# Patient Record
Sex: Female | Born: 1945 | Race: White | Hispanic: No | Marital: Single | Smoking: Former smoker
Health system: Southern US, Community
[De-identification: ages and names within clinical notes are randomized; demographics above are authoritative.]

## PROBLEM LIST (undated history)

## (undated) ENCOUNTER — Emergency Department (HOSPITAL_COMMUNITY): Admission: EM | Payer: Medicare Other | Source: Home / Self Care

## (undated) DIAGNOSIS — I350 Nonrheumatic aortic (valve) stenosis: Secondary | ICD-10-CM

## (undated) DIAGNOSIS — I219 Acute myocardial infarction, unspecified: Secondary | ICD-10-CM

## (undated) DIAGNOSIS — I890 Lymphedema, not elsewhere classified: Secondary | ICD-10-CM

## (undated) DIAGNOSIS — I480 Paroxysmal atrial fibrillation: Secondary | ICD-10-CM

## (undated) DIAGNOSIS — I447 Left bundle-branch block, unspecified: Secondary | ICD-10-CM

## (undated) DIAGNOSIS — I2510 Atherosclerotic heart disease of native coronary artery without angina pectoris: Secondary | ICD-10-CM

## (undated) DIAGNOSIS — G8929 Other chronic pain: Secondary | ICD-10-CM

## (undated) DIAGNOSIS — J449 Chronic obstructive pulmonary disease, unspecified: Secondary | ICD-10-CM

## (undated) DIAGNOSIS — R918 Other nonspecific abnormal finding of lung field: Secondary | ICD-10-CM

## (undated) DIAGNOSIS — I471 Supraventricular tachycardia, unspecified: Secondary | ICD-10-CM

## (undated) DIAGNOSIS — I10 Essential (primary) hypertension: Secondary | ICD-10-CM

## (undated) DIAGNOSIS — E785 Hyperlipidemia, unspecified: Secondary | ICD-10-CM

## (undated) DIAGNOSIS — C50919 Malignant neoplasm of unspecified site of unspecified female breast: Secondary | ICD-10-CM

## (undated) DIAGNOSIS — I509 Heart failure, unspecified: Secondary | ICD-10-CM

## (undated) HISTORY — PX: VASCULAR SURGERY: SHX849

## (undated) HISTORY — PX: OTHER SURGICAL HISTORY: SHX169

## (undated) HISTORY — PX: CORONARY STENT PLACEMENT: SHX1402

---

## 2013-01-17 DIAGNOSIS — M722 Plantar fascial fibromatosis: Secondary | ICD-10-CM | POA: Insufficient documentation

## 2013-02-22 DIAGNOSIS — M76829 Posterior tibial tendinitis, unspecified leg: Secondary | ICD-10-CM | POA: Insufficient documentation

## 2013-09-14 DIAGNOSIS — M1710 Unilateral primary osteoarthritis, unspecified knee: Secondary | ICD-10-CM | POA: Insufficient documentation

## 2016-01-24 DIAGNOSIS — F329 Major depressive disorder, single episode, unspecified: Secondary | ICD-10-CM | POA: Insufficient documentation

## 2016-01-24 DIAGNOSIS — F419 Anxiety disorder, unspecified: Secondary | ICD-10-CM | POA: Insufficient documentation

## 2016-01-24 DIAGNOSIS — F32A Depression, unspecified: Secondary | ICD-10-CM | POA: Insufficient documentation

## 2016-02-12 DIAGNOSIS — H2513 Age-related nuclear cataract, bilateral: Secondary | ICD-10-CM | POA: Insufficient documentation

## 2016-07-29 DIAGNOSIS — D229 Melanocytic nevi, unspecified: Secondary | ICD-10-CM | POA: Insufficient documentation

## 2017-10-31 DIAGNOSIS — I350 Nonrheumatic aortic (valve) stenosis: Secondary | ICD-10-CM

## 2017-10-31 HISTORY — DX: Nonrheumatic aortic (valve) stenosis: I35.0

## 2017-11-23 ENCOUNTER — Inpatient Hospital Stay (HOSPITAL_COMMUNITY)
Admission: EM | Admit: 2017-11-23 | Discharge: 2017-11-27 | DRG: 246 | Disposition: A | Discharge status: Home / Self Care | Payer: Medicare Other | Attending: Cardiology | Admitting: Cardiology

## 2017-11-23 ENCOUNTER — Encounter (HOSPITAL_COMMUNITY): Payer: Self-pay | Admitting: *Deleted

## 2017-11-23 ENCOUNTER — Emergency Department (HOSPITAL_COMMUNITY): Payer: Medicare Other

## 2017-11-23 DIAGNOSIS — R0603 Acute respiratory distress: Secondary | ICD-10-CM | POA: Diagnosis not present

## 2017-11-23 DIAGNOSIS — I2583 Coronary atherosclerosis due to lipid rich plaque: Secondary | ICD-10-CM | POA: Diagnosis not present

## 2017-11-23 DIAGNOSIS — I472 Ventricular tachycardia: Secondary | ICD-10-CM | POA: Diagnosis present

## 2017-11-23 DIAGNOSIS — Z79811 Long term (current) use of aromatase inhibitors: Secondary | ICD-10-CM | POA: Diagnosis not present

## 2017-11-23 DIAGNOSIS — Z79899 Other long term (current) drug therapy: Secondary | ICD-10-CM | POA: Diagnosis not present

## 2017-11-23 DIAGNOSIS — I272 Pulmonary hypertension, unspecified: Secondary | ICD-10-CM | POA: Diagnosis present

## 2017-11-23 DIAGNOSIS — C7951 Secondary malignant neoplasm of bone: Secondary | ICD-10-CM | POA: Diagnosis present

## 2017-11-23 DIAGNOSIS — R739 Hyperglycemia, unspecified: Secondary | ICD-10-CM | POA: Diagnosis present

## 2017-11-23 DIAGNOSIS — E785 Hyperlipidemia, unspecified: Secondary | ICD-10-CM | POA: Diagnosis present

## 2017-11-23 DIAGNOSIS — I214 Non-ST elevation (NSTEMI) myocardial infarction: Secondary | ICD-10-CM | POA: Diagnosis not present

## 2017-11-23 DIAGNOSIS — Z955 Presence of coronary angioplasty implant and graft: Secondary | ICD-10-CM

## 2017-11-23 DIAGNOSIS — Z87891 Personal history of nicotine dependence: Secondary | ICD-10-CM

## 2017-11-23 DIAGNOSIS — Z6841 Body Mass Index (BMI) 40.0 and over, adult: Secondary | ICD-10-CM | POA: Diagnosis not present

## 2017-11-23 DIAGNOSIS — R000 Tachycardia, unspecified: Secondary | ICD-10-CM | POA: Diagnosis not present

## 2017-11-23 DIAGNOSIS — I25110 Atherosclerotic heart disease of native coronary artery with unstable angina pectoris: Secondary | ICD-10-CM | POA: Diagnosis not present

## 2017-11-23 DIAGNOSIS — I447 Left bundle-branch block, unspecified: Secondary | ICD-10-CM | POA: Diagnosis not present

## 2017-11-23 DIAGNOSIS — D72829 Elevated white blood cell count, unspecified: Secondary | ICD-10-CM | POA: Diagnosis present

## 2017-11-23 DIAGNOSIS — I10 Essential (primary) hypertension: Secondary | ICD-10-CM | POA: Diagnosis not present

## 2017-11-23 DIAGNOSIS — I110 Hypertensive heart disease with heart failure: Secondary | ICD-10-CM | POA: Diagnosis present

## 2017-11-23 DIAGNOSIS — Z7982 Long term (current) use of aspirin: Secondary | ICD-10-CM | POA: Diagnosis not present

## 2017-11-23 DIAGNOSIS — G8929 Other chronic pain: Secondary | ICD-10-CM | POA: Diagnosis present

## 2017-11-23 DIAGNOSIS — I471 Supraventricular tachycardia: Secondary | ICD-10-CM | POA: Diagnosis present

## 2017-11-23 DIAGNOSIS — J9601 Acute respiratory failure with hypoxia: Secondary | ICD-10-CM | POA: Diagnosis present

## 2017-11-23 DIAGNOSIS — I5033 Acute on chronic diastolic (congestive) heart failure: Secondary | ICD-10-CM | POA: Diagnosis present

## 2017-11-23 DIAGNOSIS — I350 Nonrheumatic aortic (valve) stenosis: Secondary | ICD-10-CM | POA: Diagnosis present

## 2017-11-23 DIAGNOSIS — C50411 Malignant neoplasm of upper-outer quadrant of right female breast: Secondary | ICD-10-CM

## 2017-11-23 DIAGNOSIS — C50919 Malignant neoplasm of unspecified site of unspecified female breast: Secondary | ICD-10-CM | POA: Diagnosis not present

## 2017-11-23 DIAGNOSIS — I252 Old myocardial infarction: Secondary | ICD-10-CM | POA: Diagnosis not present

## 2017-11-23 DIAGNOSIS — I2510 Atherosclerotic heart disease of native coronary artery without angina pectoris: Secondary | ICD-10-CM | POA: Diagnosis not present

## 2017-11-23 DIAGNOSIS — I509 Heart failure, unspecified: Secondary | ICD-10-CM

## 2017-11-23 HISTORY — DX: Left bundle-branch block, unspecified: I44.7

## 2017-11-23 HISTORY — DX: Other chronic pain: G89.29

## 2017-11-23 HISTORY — DX: Nonrheumatic aortic (valve) stenosis: I35.0

## 2017-11-23 HISTORY — DX: Malignant neoplasm of unspecified site of unspecified female breast: C50.919

## 2017-11-23 HISTORY — DX: Lymphedema, not elsewhere classified: I89.0

## 2017-11-23 HISTORY — DX: Essential (primary) hypertension: I10

## 2017-11-23 HISTORY — DX: Atherosclerotic heart disease of native coronary artery without angina pectoris: I25.10

## 2017-11-23 HISTORY — DX: Other nonspecific abnormal finding of lung field: R91.8

## 2017-11-23 HISTORY — DX: Hyperlipidemia, unspecified: E78.5

## 2017-11-23 HISTORY — DX: Supraventricular tachycardia, unspecified: I47.10

## 2017-11-23 HISTORY — DX: Morbid (severe) obesity due to excess calories: E66.01

## 2017-11-23 HISTORY — DX: Heart failure, unspecified: I50.9

## 2017-11-23 HISTORY — DX: Supraventricular tachycardia: I47.1

## 2017-11-23 HISTORY — DX: Acute myocardial infarction, unspecified: I21.9

## 2017-11-23 LAB — CBC WITH DIFFERENTIAL/PLATELET
BASOS PCT: 0 %
Basophils Absolute: 0.1 10*3/uL (ref 0.0–0.1)
Eosinophils Absolute: 0.3 10*3/uL (ref 0.0–0.7)
Eosinophils Relative: 2 %
HCT: 47.2 % — ABNORMAL HIGH (ref 36.0–46.0)
HEMOGLOBIN: 14.4 g/dL (ref 12.0–15.0)
LYMPHS ABS: 7.2 10*3/uL — AB (ref 0.7–4.0)
Lymphocytes Relative: 48 %
MCH: 30.6 pg (ref 26.0–34.0)
MCHC: 30.5 g/dL (ref 30.0–36.0)
MCV: 100.4 fL — AB (ref 78.0–100.0)
MONOS PCT: 6 %
Monocytes Absolute: 0.9 10*3/uL (ref 0.1–1.0)
NEUTROS ABS: 6.7 10*3/uL (ref 1.7–7.7)
NEUTROS PCT: 44 %
Platelets: 248 10*3/uL (ref 150–400)
RBC: 4.7 MIL/uL (ref 3.87–5.11)
RDW: 13.9 % (ref 11.5–15.5)
WBC: 15 10*3/uL — AB (ref 4.0–10.5)

## 2017-11-23 LAB — BRAIN NATRIURETIC PEPTIDE: B Natriuretic Peptide: 589 pg/mL — ABNORMAL HIGH (ref 0.0–100.0)

## 2017-11-23 LAB — COMPREHENSIVE METABOLIC PANEL
ALK PHOS: 80 U/L (ref 38–126)
ALT: 35 U/L (ref 14–54)
AST: 48 U/L — AB (ref 15–41)
Albumin: 3.7 g/dL (ref 3.5–5.0)
Anion gap: 18 — ABNORMAL HIGH (ref 5–15)
BUN: 20 mg/dL (ref 6–20)
CALCIUM: 9.2 mg/dL (ref 8.9–10.3)
CO2: 22 mmol/L (ref 22–32)
CREATININE: 0.93 mg/dL (ref 0.44–1.00)
Chloride: 104 mmol/L (ref 101–111)
GFR calc non Af Amer: >60 mL/min (ref >60)
Glucose, Bld: 294 mg/dL — ABNORMAL HIGH (ref 65–99)
Potassium: 3.8 mmol/L (ref 3.5–5.1)
SODIUM: 144 mmol/L (ref 135–145)
Total Bilirubin: 0.9 mg/dL (ref 0.3–1.2)
Total Protein: 7.5 g/dL (ref 6.5–8.1)

## 2017-11-23 LAB — TROPONIN I: Troponin I: <0.03 ng/mL (ref <0.03)

## 2017-11-23 LAB — PROTIME-INR
INR: 0.95
Prothrombin Time: 12.6 seconds (ref 11.4–15.2)

## 2017-11-23 LAB — I-STAT CHEM 8, ED
BUN: 24 mg/dL — AB (ref 6–20)
CREATININE: 0.8 mg/dL (ref 0.44–1.00)
Calcium, Ion: 1.14 mmol/L — ABNORMAL LOW (ref 1.15–1.40)
Chloride: 103 mmol/L (ref 101–111)
Glucose, Bld: 288 mg/dL — ABNORMAL HIGH (ref 65–99)
HEMATOCRIT: 47 % — AB (ref 36.0–46.0)
Hemoglobin: 16 g/dL — ABNORMAL HIGH (ref 12.0–15.0)
POTASSIUM: 3.9 mmol/L (ref 3.5–5.1)
Sodium: 142 mmol/L (ref 135–145)
TCO2: 25 mmol/L (ref 22–32)

## 2017-11-23 LAB — CBG MONITORING, ED: Glucose-Capillary: 103 mg/dL — ABNORMAL HIGH (ref 65–99)

## 2017-11-23 MED ORDER — SODIUM CHLORIDE 0.9% FLUSH: 3.0000 mL | INTRAVENOUS | Status: DC | PRN

## 2017-11-23 MED ORDER — OXYCODONE HCL 5 MG PO TABS: 10.0000 mg | ORAL_TABLET | Freq: Four times a day (QID) | ORAL | Status: DC | PRN

## 2017-11-23 MED ORDER — SODIUM CHLORIDE 0.9 % IV SOLN: INTRAVENOUS | Status: DC

## 2017-11-23 MED ORDER — SODIUM CHLORIDE 0.9% FLUSH
3.0000 mL | Freq: Two times a day (BID) | INTRAVENOUS | Status: DC
  Administered 2017-11-24 – 2017-11-26 (×5): 3 mL via INTRAVENOUS

## 2017-11-23 MED ORDER — ADULT MULTIVITAMIN W/MINERALS CH
1.0000 | ORAL_TABLET | Freq: Every day | ORAL | Status: DC
  Administered 2017-11-24 – 2017-11-27 (×4): 1 via ORAL
  Filled 2017-11-23 (×4): qty 1

## 2017-11-23 MED ORDER — POTASSIUM CHLORIDE CRYS ER 20 MEQ PO TBCR
20.0000 meq | EXTENDED_RELEASE_TABLET | Freq: Every day | ORAL | Status: DC
  Administered 2017-11-24 – 2017-11-27 (×3): 20 meq via ORAL
  Filled 2017-11-23 (×4): qty 1

## 2017-11-23 MED ORDER — CALCIUM-PHOSPHORUS-VITAMIN D 250-107-500 MG-MG-UNIT PO CHEW: 2.0000 | CHEWABLE_TABLET | Freq: Two times a day (BID) | ORAL | Status: DC

## 2017-11-23 MED ORDER — FUROSEMIDE 10 MG/ML IJ SOLN
80.0000 mg | Freq: Once | INTRAMUSCULAR | Status: AC
  Administered 2017-11-23: 80 mg via INTRAVENOUS

## 2017-11-23 MED ORDER — ACETAMINOPHEN 325 MG PO TABS
650.0000 mg | ORAL_TABLET | ORAL | Status: DC | PRN
  Administered 2017-11-24: 650 mg via ORAL
  Filled 2017-11-23: qty 2

## 2017-11-23 MED ORDER — LETROZOLE 2.5 MG PO TABS
2.5000 mg | ORAL_TABLET | Freq: Every day | ORAL | Status: DC
  Administered 2017-11-24 – 2017-11-27 (×4): 2.5 mg via ORAL
  Filled 2017-11-23 (×5): qty 1

## 2017-11-23 MED ORDER — INSULIN ASPART 100 UNIT/ML ~~LOC~~ SOLN: 0.0000 [IU] | Freq: Every day | SUBCUTANEOUS | Status: DC

## 2017-11-23 MED ORDER — HYDRALAZINE HCL 20 MG/ML IJ SOLN: 10.0000 mg | INTRAMUSCULAR | Status: DC | PRN

## 2017-11-23 MED ORDER — ATENOLOL 50 MG PO TABS
25.0000 mg | ORAL_TABLET | Freq: Every day | ORAL | Status: DC
  Administered 2017-11-24 – 2017-11-27 (×4): 25 mg via ORAL
  Filled 2017-11-23 (×4): qty 1

## 2017-11-23 MED ORDER — ASPIRIN EC 325 MG PO TBEC
325.0000 mg | DELAYED_RELEASE_TABLET | Freq: Every day | ORAL | Status: DC
  Administered 2017-11-24 – 2017-11-26 (×3): 325 mg via ORAL
  Filled 2017-11-23 (×3): qty 1

## 2017-11-23 MED ORDER — SODIUM CHLORIDE 0.9 % IV SOLN: 250.0000 mL | INTRAVENOUS | Status: DC | PRN

## 2017-11-23 MED ORDER — ATORVASTATIN CALCIUM 40 MG PO TABS
80.0000 mg | ORAL_TABLET | Freq: Every day | ORAL | Status: DC
  Administered 2017-11-24 – 2017-11-26 (×3): 80 mg via ORAL
  Filled 2017-11-23: qty 1
  Filled 2017-11-23 (×3): qty 2

## 2017-11-23 MED ORDER — FUROSEMIDE 10 MG/ML IJ SOLN
40.0000 mg | Freq: Two times a day (BID) | INTRAMUSCULAR | Status: DC
  Administered 2017-11-24 – 2017-11-25 (×3): 40 mg via INTRAVENOUS
  Filled 2017-11-23 (×3): qty 4

## 2017-11-23 MED ORDER — INSULIN ASPART 100 UNIT/ML ~~LOC~~ SOLN: 0.0000 [IU] | Freq: Three times a day (TID) | SUBCUTANEOUS | Status: DC

## 2017-11-23 MED ORDER — ALPRAZOLAM 0.25 MG PO TABS
0.2500 mg | ORAL_TABLET | Freq: Two times a day (BID) | ORAL | Status: DC | PRN
  Administered 2017-11-24: 0.25 mg via ORAL
  Filled 2017-11-23 (×2): qty 1

## 2017-11-23 MED ORDER — HYDROCHLOROTHIAZIDE 25 MG PO TABS
25.0000 mg | ORAL_TABLET | Freq: Every day | ORAL | Status: DC
Start: 2017-11-24 — End: 2017-11-27
  Administered 2017-11-24 – 2017-11-27 (×4): 25 mg via ORAL
  Filled 2017-11-23 (×4): qty 1

## 2017-11-23 MED ORDER — ONDANSETRON HCL 4 MG/2ML IJ SOLN: 4.0000 mg | Freq: Four times a day (QID) | INTRAMUSCULAR | Status: DC | PRN

## 2017-11-23 MED ORDER — NITROGLYCERIN IN D5W 200-5 MCG/ML-% IV SOLN
5.0000 ug/min | Freq: Once | INTRAVENOUS | Status: AC
  Administered 2017-11-23: 5 ug/min via INTRAVENOUS

## 2017-11-23 MED ORDER — LOSARTAN POTASSIUM 50 MG PO TABS
50.0000 mg | ORAL_TABLET | Freq: Every day | ORAL | Status: DC
Start: 2017-11-24 — End: 2017-11-27
  Administered 2017-11-24 – 2017-11-27 (×4): 50 mg via ORAL
  Filled 2017-11-23 (×4): qty 1

## 2017-11-23 MED ORDER — ENOXAPARIN SODIUM 40 MG/0.4ML ~~LOC~~ SOLN
40.0000 mg | SUBCUTANEOUS | Status: DC
  Administered 2017-11-23: 40 mg via SUBCUTANEOUS
  Filled 2017-11-23: qty 0.4

## 2017-11-23 NOTE — ED Provider Notes (Signed)
[facility] EMERGENCY DEPARTMENT Provider Note   CSN: [id number] Arrival date & time: 11/23/17  1850     History   Chief Complaint Chief Complaint  Patient presents with  . Respiratory Distress    HPI Stacey [name] is a 71 y.o. female.  Patient brought in by EMS.  EMS was called on a wellness call for a woman that was sitting in her car outside of a store.  EMS immediately recognize she was having respiratory distress.  And noted that she was very tachycardic.  Having difficulty breathing.  At one point they thought she went into V. tach with a pulse they cardioverted her.  Felt that she went back to a sinus tach.  Patient arrived on 100% nonrebreather and respiratory distress with heart rate around 150.  That looked like perhaps a sinus tachycardia with a left bundle branch block.  Patient had good pulse.  Patient's blood pressure was elevated like 186/127 upon arrival.  Respiratory rate was around 40.  Eventually patient's son arrived.  Patient is from the [place] area.  She is here for Christmas.  Patient was fine earlier in the day.  Patient has she started to improve denied any chest pain or any feeling sick earlier in the day other than some mild shortness of breath.      Past Medical History:  Diagnosis Date  . CHF (congestive heart failure) ([place])   . Hypertension   . Left bundle branch block   . MI (myocardial infarction) ([place])     There are no active problems to display for this patient.   Past Surgical History:  Procedure Laterality Date  . CORONARY STENT PLACEMENT      OB History    No data available       Home Medications    Prior to Admission medications   Not on File    Family History History reviewed. No pertinent family history.  Social History Social History   Tobacco Use  . Smoking status: Former Research scientist (life sciences)  . Smokeless tobacco: Never Used  Substance Use Topics  . Alcohol use: No    Frequency: Never  . Drug use: No     Allergies     Patient has no known allergies.   Review of Systems Review of Systems  Unable to perform ROS: Severe respiratory distress     Physical Exam Updated Vital Signs BP 103/70   Pulse 88   Resp (!) 26   Ht 1.499 m (4\' 11" )   Wt 121.1 kg (267 lb)   SpO2 98%   BMI 53.93 kg/m   Physical Exam  Constitutional: She appears well-developed and well-nourished. She appears distressed.  HENT:  Head: Normocephalic and atraumatic.  Eyes: Conjunctivae and EOM are normal. Pupils are equal, round, and reactive to light.  Neck: Neck supple.  Cardiovascular:  Tachycardic  Pulmonary/Chest: She is in respiratory distress. She has rales.  Abdominal: Soft. Bowel sounds are normal. There is no tenderness.  Musculoskeletal: She exhibits edema.  Neurological: She is alert.  Alert but somnolent.  Would follow commands.  Would move all 4 extremities.  Would communicate some.  Skin: Skin is warm.  Nursing note and vitals reviewed.    ED Treatments / Results  Labs (all labs ordered are listed, but only abnormal results are displayed) Labs Reviewed  CBC WITH DIFFERENTIAL/PLATELET - Abnormal; Notable for the following components:      Result Value   WBC 15.0 (*)    HCT 47.2 (*)  MCV 100.4 (*)    Lymphs Abs 7.2 (*)    All other components within normal limits  COMPREHENSIVE METABOLIC PANEL - Abnormal; Notable for the following components:   Glucose, Bld 294 (*)    AST 48 (*)    Anion gap 18 (*)    All other components within normal limits  BRAIN NATRIURETIC PEPTIDE - Abnormal; Notable for the following components:   B Natriuretic Peptide 589.0 (*)    All other components within normal limits  I-STAT CHEM 8, ED - Abnormal; Notable for the following components:   BUN 24 (*)    Glucose, Bld 288 (*)    Calcium, Ion 1.14 (*)    Hemoglobin 16.0 (*)    HCT 47.0 (*)    All other components within normal limits  TROPONIN I  PROTIME-INR  URINALYSIS, ROUTINE W REFLEX MICROSCOPIC    EKG  EKG  Interpretation  Date/Time:  Monday November 23 2017 18:55:03 EST Ventricular Rate:  128 PR Interval:    QRS Duration: 147 QT Interval:  338 QTC Calculation: 494 R Axis:   -54 Text Interpretation:  Sinus tachycardia Left bundle branch block Baseline wander in lead(s) V4 No previous ECGs available Confirmed by Fredia Sorrow [phone]) on 11/23/2017 7:15:13 PM       Radiology Dg Chest Portable 1 View  Result Date: 11/23/2017 CLINICAL DATA:  Respiratory distress. EXAM: PORTABLE CHEST 1 VIEW COMPARISON:  None. FINDINGS: Lungs are adequately inflated with hazy central bilateral airspace opacification which may be due to interstitial edema versus infection. No definite effusion. Cardiomediastinal silhouette is within normal. There is mild degenerate change of the spine. IMPRESSION: Hazy bilateral central airspace opacification which may be due to interstitial edema versus infection. Electronically Signed   By: Marin Olp M.D.   On: 11/23/2017 19:09    Procedures Procedures (including critical care time)  CRITICAL CARE Performed by: Fredia Sorrow Total critical care time: 30 minutes Critical care time was exclusive of separately billable procedures and treating other patients. Critical care was necessary to treat or prevent imminent or life-threatening deterioration. Critical care was time spent personally by me on the following activities: development of treatment plan with patient and/or surrogate as well as nursing, discussions with consultants, evaluation of patient's response to treatment, examination of patient, obtaining history from patient or surrogate, ordering and performing treatments and interventions, ordering and review of laboratory studies, ordering and review of radiographic studies, pulse oximetry and re-evaluation of patient's condition.   Medications Ordered in ED Medications  0.9 %  sodium chloride infusion ( Intravenous New Bag/Given 11/23/17 1918)  furosemide  (LASIX) injection 80 mg (80 mg Intravenous Given 11/23/17 1900)  nitroGLYCERIN 50 mg in dextrose 5 % 250 mL (0.2 mg/mL) infusion (5 mcg/min Intravenous New Bag/Given 11/23/17 1912)     Initial Impression / Assessment and Plan / ED Course  I have reviewed the triage vital signs and the nursing notes.  Pertinent labs & imaging results that were available during my care of the patient were reviewed by me and considered in my medical decision making (see chart for details).    Patient brought in by EMS.  Obvious respiratory distress.  On 100% nonrebreather marketed tachycardia market hypertension.  Patient from [place] area.  Patient states that she has a history of congestive heart failure.  Has cardiac stents.  Patient also has hypertension.  Denied diabetes.  Patient also stated that she has a history of breast cancer in the past.  Patient started immediately  on BiPAP.  Started on nitroglycerin drip for the elevated blood pressure and given 80 mg of Lasix patient on the BiPAP had significant improvement in her respiratory status and also brought her heart rate down to around 120 from the 150 range.  Then after the Lasix and the nitroglycerin drip heart rate came down to around 100 or in the upper 90s.  Nitroglycerin drip brought her blood pressure diastolics from 403 down to around 120.  Overall patient was significant improvement.  Patient states that she did not have any chest pain.  No complaints earlier other than some shortness of breath. Patient is visiting family members here from the [place] area.  Most recent vital signs patient's heart rate was down to 87.  Blood pressure systolic 98 diastolic 61.  Patient overall feeling much better.  Final Clinical Impressions(s) / ED Diagnoses   Final diagnoses:  Acute on chronic congestive heart failure, unspecified heart failure type [facility])  Respiratory distress    ED Discharge Orders    None       Fredia Sorrow, MD 11/23/17 2012

## 2017-11-23 NOTE — H&P (Addendum)
History and Physical    [place] GYF:[id number] DOB: 02/19/1946 DOA: 11/23/2017  PCP: Bernette Redbird, MD   Patient coming from: Home  Chief Complaint: Respiratory distress  HPI: Stacey [name] is a 71 y.o. 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  Patient had reportedly been doing fairly well earlier in the day with no recent cough, chest pain, fevers, or chills.  She raised concern of bystanders when she was noted to be sitting in her car and apparent respiratory distress in a parking lot.  EMS came to check on her, found her to be in acute distress, put her on the monitor, suspect of the she may be in ventricular tachycardia, administered a shock, and transported her to the hospital.  Patient denies any chest pain or palpitations, reports of dyspnea, but no cough or fever.  Reports chronic bilateral lower extremity swelling.  Reports history of CHF, and acknowledges some dietary indiscretions over the past day or so as she has been visiting local family for the holidays from her usual residence near [place].  ED Course: Upon arrival to the ED, patient is found to be in acute respiratory distress, tachypneic with respiratory rate close to 40, tachycardic in the 120s, and hypertensive to 190/120.  EKG features a sinus tachycardia with rate 128 and chronic left bundle branch block.  Chest x-ray is notable for hazy bilateral airspace opacification suspected secondary to interstitial edema.  Chemistry panel is notable for serum glucose of 294 and CBC features a leukocytosis to 15,000.  BNP is elevated to 589, troponin is undetectable, and INR is normal.  Patient was started on nitroglycerin infusion, BiPAP, given 80 mg IV Lasix.  Blood pressure normalized,  work of breathing improved, respiratory rate is down to the 20s, pressure has fallen to the roots of acceptable range.  Nitroglycerin was stopped due to low blood pressure and overall pressure remained stable off the nitroglycerin drip, she continues to require BiPAP but is much improved, and will be admitted to the stepdown unit for ongoing evaluation and of acute hypoxic respiratory failure suspected secondary to acute on chronic diastolic CHF.  Review of Systems:  All other systems reviewed and apart from HPI, are negative.  Past Medical History:  Diagnosis Date  . CHF (congestive heart failure) ([place])   . Hypertension   . Left bundle branch block   . MI (myocardial infarction) [facility])     Past Surgical History:  Procedure Laterality Date  . CORONARY STENT PLACEMENT       reports that she has quit smoking. she has never used smokeless tobacco. She reports that she does not drink alcohol or use drugs.  No Known Allergies  Family History  Problem Relation Age of Onset  . Sudden Cardiac Death Neg Hx      Prior to Admission medications   Medication Sig Start Date End Date Taking? Authorizing Provider  aspirin EC 325 MG tablet Take 325 mg by mouth daily.   Yes [provider]  atenolol (TENORMIN) 25 MG tablet Take 25 mg by mouth daily.   Yes [provider]  atorvastatin (LIPITOR) 80 MG tablet Take 80 mg by mouth daily.   Yes [provider]  Calcium-Phosphorus-Vitamin D (CITRACAL +D3) 250-107-500 MG-MG-UNIT CHEW Chew 2 tablets by mouth 2 (two) times daily.   Yes  [provider]  hydrochlorothiazide (HYDRODIURIL) 25 MG tablet Take 25 mg by mouth daily.   Yes [provider]  letrozole (FEMARA) 2.5 MG tablet Take 2.5 mg by mouth daily.   Yes [provider]  losartan (COZAAR) 50 MG tablet Take 50 mg by mouth daily.   Yes [provider]  Multiple Vitamin (MULTIVITAMIN WITH MINERALS) TABS tablet Take 1 tablet by mouth daily.    Yes [provider]  Oxycodone HCl 10 MG TABS Take 10 mg by mouth every 6 (six) hours as needed (for pain).   Yes [provider]  Potassium 99 MG TABS Take 1 tablet by mouth daily.   Yes [provider]    Physical Exam: Vitals:   11/23/17 2015 11/23/17 2026 11/23/17 2100 11/23/17 2115  BP: (!) 74/53 104/71 138/77 123/78  Pulse: 87 74 76   Resp: (!) _0 SpO2: 100% 100% 100%   Weight:      Height:          Constitutional: Acute respiratory distress with tachypnea and accessory muscle use. No pallor, no diaphoresis.  Eyes: PERTLA, lids and conjunctivae normal ENMT: Mucous membranes are moist. Posterior pharynx clear of any exudate or lesions.   Neck: normal, supple, no masses, no thyromegaly Respiratory: Diffuse rales. Increased WOB. No pallor. No cyanosis.   Cardiovascular: Rate ~120 and regular. 2+ pretibial edema bilaterally. Abdomen: No distension, no tenderness, no masses palpated. Bowel sounds normal.  Musculoskeletal: no clubbing / cyanosis. No joint deformity upper and lower extremities.  Skin: no significant rashes, lesions, ulcers. Warm, dry, well-perfused. Neurologic: CN 2-12 grossly intact. Sensation intact. Strength 5/5 in all 4 limbs.  Psychiatric:  Alert and oriented x 3. Pleasant, cooperative.     Labs on Admission: I have personally reviewed following labs and imaging studies  CBC: Recent Labs  Lab 11/23/17 1900 11/23/17 1901  WBC 15.0*  --   NEUTROABS 6.7  --   HGB 14.4 16.0*  HCT 47.2* 47.0*  MCV 100.4*  --   PLT 248  --    Basic Metabolic Panel: Recent Labs  Lab 11/23/17 1900 11/23/17 1901  NA 144 142  K 3.8 3.9  CL 104 103  CO2 22  --   GLUCOSE 294* 288*  BUN 20 24*  CREATININE 0.93 0.80  CALCIUM 9.2  --    GFR: Estimated Creatinine Clearance: 76.9 mL/min (by C-G formula based on SCr of 0.8 mg/dL). Liver Function Tests: Recent Labs  Lab 11/23/17 1900  AST 48*  ALT 35  ALKPHOS 80  BILITOT 0.9   PROT 7.5  ALBUMIN 3.7   No results for input(s): LIPASE, AMYLASE in the last 168 hours. No results for input(s): AMMONIA in the last 168 hours. Coagulation Profile: Recent Labs  Lab 11/23/17 1900  INR 0.95   Cardiac Enzymes: Recent Labs  Lab 11/23/17 1900  TROPONINI <0.03   BNP (last 3 results) No results for input(s): PROBNP in the last 8760 hours. HbA1C: No results for input(s): HGBA1C in the last 72 hours. CBG: No results for input(s): GLUCAP in the last 168 hours. Lipid Profile: No results for input(s): CHOL, HDL, LDLCALC, TRIG, CHOLHDL, LDLDIRECT in the last 72 hours. Thyroid Function Tests: No results for input(s): TSH, T4TOTAL, FREET4, T3FREE, THYROIDAB in the last 72 hours. Anemia Panel: No results for input(s): VITAMINB12, FOLATE, FERRITIN, TIBC, IRON, RETICCTPCT in the last 72 hours. Urine analysis: No results found for: COLORURINE, APPEARANCEUR, [place], [place], [place], [place], [place],  KETONESUR, PROTEINUR, UROBILINOGEN, NITRITE, LEUKOCYTESUR Sepsis Labs: _0 (procalcitonin:4,lacticidven:4) )No results found for this or any previous visit (from the past 240 hour(s)).   Radiological Exams on Admission: Dg Chest Portable 1 View  Result Date: 11/23/2017 CLINICAL DATA:  Respiratory distress. EXAM: PORTABLE CHEST 1 VIEW COMPARISON:  None. FINDINGS: Lungs are adequately inflated with hazy central bilateral airspace opacification which may be due to interstitial edema versus infection. No definite effusion. Cardiomediastinal silhouette is within normal. There is mild degenerate change of the spine. IMPRESSION: Hazy bilateral central airspace opacification which may be due to interstitial edema versus infection. Electronically Signed   By: Marin Olp M.D.   On: 11/23/2017 19:09    EKG: Independently reviewed. Sinus tachycardia (rate 128), chronic LBBB.   Assessment/Plan  1. Acute on chronic diastolic CHF; acute hypoxic respiratory failure  - Pt  presents with acute respiratory distress requiring BiPAP, found to have diffuse crackles and edema on CXR, elevated BNP  - Treated in ED with Lasix 80 mg IV and nitroglycerin infusion; she has made a dramatic improvement in ED with this  - TTE (12/15/16) with EF 50%, mild LVH, grade 1 diastolic dysfunction, mild pulm HTN, no significant valvular disease   - Continue cardiac monitoring, SLIV, fluid-restrict, follow daily wts and I/O's, continue diuresis with Lasix 40 mg IV q12h, continue beta-blocker and ARB, wean from BiPAP as tolerated, follow chem panel daily, update echocardiogram    2. Hypertension  - BP elevated to 190/120 range initially, normalized with IV Lasix and IV nitroglycerin  - Continue beta-blocker and ARB, use hydralazine IVP's prn    3. CAD; chronic LBBB  - No anginal complaints and troponin neg  - Reports hx of stents [facility] at [facility] and has a chronic LBBB - Suspect her presentation was secondary to dietary indiscretions, but ischemic etiology will be excluded with repeat troponin in a few hours as she never had chest pain with prior MI  - Continue cardiac monitoring, check another troponin, continue ASA, beta-blocker, and ARB   4. Breast cancer  - Right UOQ mass with thoracic spine met, following at [facility]  - Continue Letrozole   5. Hyperglycemia  - No hx of DM and no A1c on file  - Serum glucose 294 on admission  - Follow CBG's and use a SSI with Novolog as needed     DVT prophylaxis: Lovenox Code Status: Full  Family Communication: Family updated at bedside Disposition Plan: Admit to SDU Consults called: None Admission status: Inpatient    Vianne Bulls, MD [facility] Pager [phone]  If 7PM-7AM, please contact night-coverage [email or web address] Password [facility]  11/23/2017, 9:27 PM

## 2017-11-23 NOTE — ED Notes (Signed)
Nitro stopped due to soft B/p-98/61

## 2017-11-23 NOTE — ED Triage Notes (Signed)
Pt brought in emergently by [facility] for respiratory distress; pt was found sitting in her car by herself having difficulty breathing; when ems placed pt on monitor pt was found to be in vtach, pt was cardioverted x 1 by ems and converted to sinus tach; pt brought in on NRB

## 2017-11-24 ENCOUNTER — Other Ambulatory Visit: Payer: Self-pay

## 2017-11-24 ENCOUNTER — Inpatient Hospital Stay (HOSPITAL_COMMUNITY): Payer: Medicare Other

## 2017-11-24 DIAGNOSIS — I214 Non-ST elevation (NSTEMI) myocardial infarction: Secondary | ICD-10-CM

## 2017-11-24 DIAGNOSIS — I5033 Acute on chronic diastolic (congestive) heart failure: Secondary | ICD-10-CM

## 2017-11-24 LAB — URINALYSIS, ROUTINE W REFLEX MICROSCOPIC
Bilirubin Urine: NEGATIVE
Glucose, UA: NEGATIVE mg/dL
Hgb urine dipstick: NEGATIVE
Ketones, ur: NEGATIVE mg/dL
Leukocytes, UA: NEGATIVE
Nitrite: NEGATIVE
Protein, ur: NEGATIVE mg/dL
Specific Gravity, Urine: 1.012 (ref 1.005–1.030)
pH: 6 (ref 5.0–8.0)

## 2017-11-24 LAB — ECHOCARDIOGRAM COMPLETE
Height: 59 in
Weight: 4299.85 oz

## 2017-11-24 LAB — BASIC METABOLIC PANEL
Anion gap: 10 (ref 5–15)
BUN: 20 mg/dL (ref 6–20)
CHLORIDE: 102 mmol/L (ref 101–111)
CO2: 30 mmol/L (ref 22–32)
Calcium: 8.3 mg/dL — ABNORMAL LOW (ref 8.9–10.3)
Creatinine, Ser: 0.72 mg/dL (ref 0.44–1.00)
GFR calc non Af Amer: >60 mL/min (ref >60)
Glucose, Bld: 124 mg/dL — ABNORMAL HIGH (ref 65–99)
POTASSIUM: 3.7 mmol/L (ref 3.5–5.1)
SODIUM: 142 mmol/L (ref 135–145)

## 2017-11-24 LAB — APTT: APTT: 31 s (ref 24–36)

## 2017-11-24 LAB — TROPONIN I
TROPONIN I: 1.17 ng/mL — AB (ref <0.03)
Troponin I: 0.78 ng/mL (ref <0.03)
Troponin I: 0.9 ng/mL (ref <0.03)
Troponin I: 0.73 ng/mL (ref <0.03)

## 2017-11-24 LAB — HEPARIN LEVEL (UNFRACTIONATED): HEPARIN UNFRACTIONATED: 0.42 [IU]/mL (ref 0.30–0.70)

## 2017-11-24 LAB — GLUCOSE, CAPILLARY
GLUCOSE-CAPILLARY: 108 mg/dL — AB (ref 65–99)
GLUCOSE-CAPILLARY: 93 mg/dL (ref 65–99)
Glucose-Capillary: 106 mg/dL — ABNORMAL HIGH (ref 65–99)
Glucose-Capillary: 100 mg/dL — ABNORMAL HIGH (ref 65–99)

## 2017-11-24 LAB — MRSA PCR SCREENING: MRSA by PCR: NEGATIVE

## 2017-11-24 MED ORDER — ORAL CARE MOUTH RINSE
15.0000 mL | Freq: Two times a day (BID) | OROMUCOSAL | Status: DC
  Administered 2017-11-24 – 2017-11-26 (×3): 15 mL via OROMUCOSAL

## 2017-11-24 MED ORDER — CHLORHEXIDINE GLUCONATE 0.12 % MT SOLN
15.0000 mL | Freq: Two times a day (BID) | OROMUCOSAL | Status: DC
  Administered 2017-11-24 – 2017-11-27 (×7): 15 mL via OROMUCOSAL
  Filled 2017-11-24 (×7): qty 15

## 2017-11-24 MED ORDER — CALCIUM CARBONATE-VITAMIN D 500-200 MG-UNIT PO TABS
1.0000 | ORAL_TABLET | Freq: Two times a day (BID) | ORAL | Status: DC
  Administered 2017-11-24 – 2017-11-27 (×7): 1 via ORAL
  Filled 2017-11-24 (×12): qty 1

## 2017-11-24 MED ORDER — HEPARIN (PORCINE) IN NACL 100-0.45 UNIT/ML-% IJ SOLN
900.0000 [IU]/h | INTRAMUSCULAR | Status: DC
  Administered 2017-11-24 – 2017-11-26 (×3): 900 [IU]/h via INTRAVENOUS
  Filled 2017-11-24 (×4): qty 250

## 2017-11-24 NOTE — Progress Notes (Signed)
Dr. Florene Glen notified of Troponin 1.17.

## 2017-11-24 NOTE — Progress Notes (Signed)
CRITICAL VALUE ALERT  Critical Value:  Troponin 0.78  Date & Time Notied: 11/24/2017 at 0200  Provider Notified: Opyd MD  Orders Received/Actions taken: None received yet

## 2017-11-24 NOTE — Progress Notes (Signed)
Patient off BiPAP for now , on 2lpm nasal cannula

## 2017-11-24 NOTE — Progress Notes (Signed)
PROGRESS NOTE    [place]  WER:[id number] DOB: 1946-09-20 DOA: 11/23/2017 PCP: Bernette Redbird, MD   Brief Narrative:  71 y.o. 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  Patient had reportedly been doing fairly well earlier in the day with no recent cough, chest pain, fevers, or chills.  She raised concern of bystanders when she was noted to be sitting in her car and apparent respiratory distress in [place] parking lot.  EMS came to check on her, found her to be in acute distress, put her on the monitor, suspect of the she may be in ventricular tachycardia, administered Param Capri shock, and transported her to the hospital.  Patient denies any chest pain or palpitations, reports of dyspnea, but no cough or fever.  Reports chronic bilateral lower extremity swelling.  Reports history of CHF, and acknowledges some dietary indiscretions over the past day or so as she has been visiting local family for the holidays from her usual residence near [place]:   Principal Problem:   Acute on chronic diastolic CHF (congestive heart failure) ([place]) Active Problems:   Hypertension   Left bundle branch block   CAD (coronary artery disease)   Breast cancer ([place])   Acute respiratory failure with hypoxia ([place])   1. Acute on chronic diastolic CHF; acute hypoxic respiratory failure  - Pt presents with acute respiratory distress requiring BiPAP, found to have diffuse crackles and edema on CXR, elevated BNP  - Treated in ED with Lasix 80 mg IV and nitroglycerin infusion; she has made Tifanie Gardiner dramatic improvement in ED with this  - Lasix 40 IV BID ordered - now off bipap - pt on losartan and atenolol - TTE (12/15/16) with EF 50%, mild LVH, grade 1 diastolic dysfunction,  mild pulm HTN, no significant valvular disease   '[ ]'  repeat echo today with EF 19-50%, grade 1 diastolic dysfunction, mild AS, PA peak pressure 32 mmHg - Notes her weight is typically 263-265 (currently 268)  2.  Elevated troponin:  She denies CP throughout this episode, only shortness of breath.  Troponin rising from negative to 0.78 to 1.17.  On heparin gtt.  ASA given.  Discussed with cardiology (Dr. Radford Pax) who recommended follow up echo and if stable consult cardiology at [facility] tomorrow.  3. CAD; chronic LBBB  - No anginal complaints, but troponin rising as above - Reports hx of stents placed at [facility] and has Arbor Cohen chronic LBBB - Suspect her presentation was secondary to dietary indiscretions - Continue cardiac monitoring, continue ASA, beta-blocker, and ARB    4. Hypertension  - BP elevated to 190/120 range initially, improved with IV lasix and nitroglycerin - Continue beta-blocker and ARB, use hydralazine IVP's prn.   - Add or increase to regimen as needed.  5. Breast cancer  - Right UOQ mass with thoracic spine met, following at [facility]  - Continue Letrozole   6. Hyperglycemia  - No hx of DM and no A1c on file  - Serum glucose 294 on admission  - Follow CBG's and use Ross Hefferan SSI with Novolog as needed    DVT prophylaxis: heparin Code Status: full  Family Communication: none at bedside Disposition Plan: pending    Consultants:   Cardiology  Procedures: (Don't include imaging studies which can be auto populated. Include things that cannot  be auto populated i.e. Echo, Carotid and venous dopplers, Foley, Bipap, HD, tubes/drains, wound vac, central lines etc)  pending  Antimicrobials: (specify start and planned stop date. Auto populated tables are space occupying and do not give end dates)  none    Subjective: SOB better.   No CP. Notes she just couldn't catch her breath.  Couldn't get air in or out. Doesn't remember being shocked.   Objective: Vitals:   11/24/17 0500  11/24/17 0530 11/24/17 0600 11/24/17 0630  BP: 136/64 (!) 149/65 (!) 174/82 (!) 172/75  Pulse: 74 75 86 80  Resp: (!) '23 20 18 19  ' Temp:      TempSrc:      SpO2: 99% 100% 99% 99%  Weight:      Height:        Intake/Output Summary (Last 24 hours) at 11/24/2017 0832 Last data filed at 11/24/2017 0000 Gross per 24 hour  Intake -  Output 1000 ml  Net -1000 ml   Filed Weights   11/23/17 1855 11/24/17 0045 11/24/17 0430  Weight: 121.1 kg (267 lb) 120.1 kg (264 lb 12.4 oz) 121.9 kg (268 lb 11.9 oz)    Examination:  General exam: Appears calm and comfortable  Respiratory system: Clear to auscultation. Respiratory effort normal.  Faint crackles at bases. Cardiovascular system: S1 & S2 heard, RRR. No JVD, murmurs, rubs, gallops or clicks. Gastrointestinal system: Abdomen is nondistended, soft and nontender. No organomegaly or masses felt. Normal bowel sounds heard. Central nervous system: Alert and oriented. No focal neurological deficits. Extremities: 1+ LEE bilaterally Skin: No rashes, lesions or ulcers Psychiatry: Judgement and insight appear normal. Mood & affect appropriate.     Data Reviewed: I have personally reviewed following labs and imaging studies  CBC: Recent Labs  Lab 11/23/17 1900 11/23/17 1901  WBC 15.0*  --   NEUTROABS 6.7  --   HGB 14.4 16.0*  HCT 47.2* 47.0*  MCV 100.4*  --   PLT 248  --    Basic Metabolic Panel: Recent Labs  Lab 11/23/17 1900 11/23/17 1901 11/24/17 0706  NA 144 142 142  K 3.8 3.9 3.7  CL 104 103 102  CO2 22  --  30  GLUCOSE 294* 288* 124*  BUN 20 24* 20  CREATININE 0.93 0.80 0.72  CALCIUM 9.2  --  8.3*   GFR: Estimated Creatinine Clearance: 77.2 mL/min (by C-G formula based on SCr of 0.72 mg/dL). Liver Function Tests: Recent Labs  Lab 11/23/17 1900  AST 48*  ALT 35  ALKPHOS 80  BILITOT 0.9  PROT 7.5  ALBUMIN 3.7   No results for input(s): LIPASE, AMYLASE in the last 168 hours. No results for input(s): AMMONIA in  the last 168 hours. Coagulation Profile: Recent Labs  Lab 11/23/17 1900  INR 0.95   Cardiac Enzymes: Recent Labs  Lab 11/23/17 1900 11/24/17 0106 11/24/17 0706  TROPONINI <0.03 0.78* 1.17*   BNP (last 3 results) No results for input(s): PROBNP in the last 8760 hours. HbA1C: No results for input(s): HGBA1C in the last 72 hours. CBG: Recent Labs  Lab 11/23/17 2254 11/24/17 0718  GLUCAP 103* 106*   Lipid Profile: No results for input(s): CHOL, HDL, LDLCALC, TRIG, CHOLHDL, LDLDIRECT in the last 72 hours. Thyroid Function Tests: No results for input(s): TSH, T4TOTAL, FREET4, T3FREE, THYROIDAB in the last 72 hours. Anemia Panel: No results for input(s): VITAMINB12, FOLATE, FERRITIN, TIBC, IRON, RETICCTPCT in the last 72 hours. Sepsis Labs: No results for input(s): PROCALCITON, LATICACIDVEN  in the last 168 hours.  Recent Results (from the past 240 hour(s))  MRSA PCR Screening     Status: None   Collection Time: 11/24/17 12:17 AM  Result Value Ref Range Status   MRSA by PCR NEGATIVE NEGATIVE Final    Comment:        The GeneXpert MRSA Assay (FDA approved for NASAL specimens only), is one component of Lynette Topete comprehensive MRSA colonization surveillance program. It is not intended to diagnose MRSA infection nor to guide or monitor treatment for MRSA infections.          Radiology Studies: Dg Chest Portable 1 View  Result Date: 11/23/2017 CLINICAL DATA:  Respiratory distress. EXAM: PORTABLE CHEST 1 VIEW COMPARISON:  None. FINDINGS: Lungs are adequately inflated with hazy central bilateral airspace opacification which may be due to interstitial edema versus infection. No definite effusion. Cardiomediastinal silhouette is within normal. There is mild degenerate change of the spine. IMPRESSION: Hazy bilateral central airspace opacification which may be due to interstitial edema versus infection. Electronically Signed   By: Marin Olp M.D.   On: 11/23/2017 19:09         Scheduled Meds: . aspirin EC  325 mg Oral Daily  . atenolol  25 mg Oral Daily  . atorvastatin  80 mg Oral q1800  . calcium-vitamin D  1 tablet Oral BID  . chlorhexidine  15 mL Mouth Rinse BID  . furosemide  40 mg Intravenous Q12H  . hydrochlorothiazide  25 mg Oral Daily  . insulin aspart  0-15 Units Subcutaneous TID WC  . insulin aspart  0-5 Units Subcutaneous QHS  . letrozole  2.5 mg Oral Daily  . losartan  50 mg Oral Daily  . mouth rinse  15 mL Mouth Rinse q12n4p  . multivitamin with minerals  1 tablet Oral Daily  . potassium chloride  20 mEq Oral Daily  . sodium chloride flush  3 mL Intravenous Q12H   Continuous Infusions: . sodium chloride    . heparin 900 Units/hr (11/24/17 0415)     LOS: 1 day    Time spent: over 41 min    Fayrene Helper, MD [facility] [phone]  If 7PM-7AM, please contact night-coverage [email or web address] Password [facility] 11/24/2017, 8:32 AM

## 2017-11-24 NOTE — Progress Notes (Signed)
ANTICOAGULATION CONSULT NOTE - Preliminary  Pharmacy Consult for Heparin Indication: ACS/STEMI  No Known Allergies  Patient Measurements: Height: 4\' 11"  (149.9 cm) Weight: 264 lb 12.4 oz (120.1 kg) IBW/kg (Calculated) : 43.2 HEPARIN DW (KG): 73.8   Vital Signs: Temp: 98.2 F (36.8 C) (12/25 0045) Temp Source: Axillary (12/25 0045) BP: 162/73 (12/25 0230) Pulse Rate: 73 (12/25 0230)  Labs: Recent Labs    11/23/17 1900 11/23/17 1901 11/24/17 0106  HGB 14.4 16.0*  --   HCT 47.2* 47.0*  --   PLT 248  --   --   LABPROT 12.6  --   --   INR 0.95  --   --   CREATININE 0.93 0.80  --   TROPONINI <0.03  --  0.78*   Estimated Creatinine Clearance: 76.4 mL/min (by C-G formula based on SCr of 0.8 mg/dL).  Medical History: Past Medical History:  Diagnosis Date  . CHF (congestive heart failure) ([place])   . Hypertension   . Left bundle branch block   . MI (myocardial infarction) ([place])     Medications:  Enoxaparin 40 mg SQ at 2200 11/23/17 in the ED  Assessment: 71 yo morbidly obese female brought to the ED by EMS with acute respiratory distress and elevated BP. Hx of chronic diastolic CHF. Troponin is now elevated above initial undetectable level. Pharmacy has been consulted for IV heparin dosing.   Goal of Therapy:  Heparin goal level: 0.3-0.7 units/ml Monitor platelets by anticoagulation protocol   Plan:  Heparin infusion at 900 units/hr (no bolus due to enoxaparin dose) Heparin level in 6-8 hours  Preliminary review of pertinent patient information completed.  Forestine Na clinical pharmacist will complete review during morning rounds to assess the patient and finalize treatment regimen.  Norberto Sorenson, [facility] 11/24/2017,3:00 AM

## 2017-11-24 NOTE — Progress Notes (Signed)
ANTICOAGULATION CONSULT NOTE   Pharmacy Consult for Heparin Indication: ACS/STEMI  No Known Allergies  Patient Measurements: Height: 4\' 11"  (149.9 cm) Weight: 268 lb 11.9 oz (121.9 kg) IBW/kg (Calculated) : 43.2 HEPARIN DW (KG): 73.8   Vital Signs: Temp: 98.4 F (36.9 C) (12/25 0430) Temp Source: Oral (12/25 0430) BP: 148/70 (12/25 0918) Pulse Rate: 83 (12/25 0918)  Labs: Recent Labs    11/23/17 1900 11/23/17 1901 11/24/17 0106 11/24/17 0331 11/24/17 0706 11/24/17 1141  HGB 14.4 16.0*  --   --   --   --   HCT 47.2* 47.0*  --   --   --   --   PLT 248  --   --   --   --   --   APTT  --   --   --  31  --   --   LABPROT 12.6  --   --   --   --   --   INR 0.95  --   --   --   --   --   HEPARINUNFRC  --   --   --   --   --  0.42  CREATININE 0.93 0.80  --   --  0.72  --   TROPONINI <0.03  --  0.78*  --  1.17*  --    Estimated Creatinine Clearance: 77.2 mL/min (by C-G formula based on SCr of 0.72 mg/dL).  Medical History: Past Medical History:  Diagnosis Date  . CHF (congestive heart failure) ([place])   . Hypertension   . Left bundle branch block   . MI (myocardial infarction) ([place])     Medications:  Enoxaparin 40 mg SQ at 2200 11/23/17 in the ED  Assessment: 71 yo morbidly obese female brought to the ED by EMS with acute respiratory distress and elevated BP. Hx of chronic diastolic CHF. Troponin is now elevated. Pharmacy has been consulted for IV heparin dosing. Heparin level this am is therapeutic.  Goal of Therapy:  Heparin goal level: 0.3-0.7 units/ml Monitor platelets by anticoagulation protocol   Plan:  Heparin infusion at 900 units/hr Heparin level and CBC daily while on heparin Monitor for bleeding complications   Beverlee Nims, RPH 11/24/2017,12:22 PM

## 2017-11-24 NOTE — Progress Notes (Signed)
*  PRELIMINARY RESULTS* Echocardiogram 2D Echocardiogram has been performed.  Stacey [name] 11/24/2017, 1:01 PM

## 2017-11-25 ENCOUNTER — Encounter (HOSPITAL_COMMUNITY): Payer: Self-pay | Admitting: Physician Assistant

## 2017-11-25 DIAGNOSIS — R000 Tachycardia, unspecified: Secondary | ICD-10-CM

## 2017-11-25 DIAGNOSIS — I5033 Acute on chronic diastolic (congestive) heart failure: Secondary | ICD-10-CM

## 2017-11-25 DIAGNOSIS — I214 Non-ST elevation (NSTEMI) myocardial infarction: Principal | ICD-10-CM

## 2017-11-25 LAB — CBC
HCT: 39.8 % (ref 36.0–46.0)
Hemoglobin: 12.3 g/dL (ref 12.0–15.0)
MCH: 30.1 pg (ref 26.0–34.0)
MCHC: 30.9 g/dL (ref 30.0–36.0)
MCV: 97.5 fL (ref 78.0–100.0)
PLATELETS: 176 10*3/uL (ref 150–400)
RBC: 4.08 MIL/uL (ref 3.87–5.11)
RDW: 14.2 % (ref 11.5–15.5)
WBC: 4.7 10*3/uL (ref 4.0–10.5)

## 2017-11-25 LAB — BASIC METABOLIC PANEL
Anion gap: 11 (ref 5–15)
BUN: 23 mg/dL — ABNORMAL HIGH (ref 6–20)
CALCIUM: 8.6 mg/dL — AB (ref 8.9–10.3)
CO2: 31 mmol/L (ref 22–32)
CREATININE: 0.76 mg/dL (ref 0.44–1.00)
Chloride: 99 mmol/L — ABNORMAL LOW (ref 101–111)
GFR calc non Af Amer: >60 mL/min (ref >60)
Glucose, Bld: 119 mg/dL — ABNORMAL HIGH (ref 65–99)
Potassium: 3.2 mmol/L — ABNORMAL LOW (ref 3.5–5.1)
SODIUM: 141 mmol/L (ref 135–145)

## 2017-11-25 LAB — HEPARIN LEVEL (UNFRACTIONATED): HEPARIN UNFRACTIONATED: 0.38 [IU]/mL (ref 0.30–0.70)

## 2017-11-25 LAB — GLUCOSE, CAPILLARY
GLUCOSE-CAPILLARY: 119 mg/dL — AB (ref 65–99)
Glucose-Capillary: 133 mg/dL — ABNORMAL HIGH (ref 65–99)
Glucose-Capillary: 108 mg/dL — ABNORMAL HIGH (ref 65–99)
Glucose-Capillary: 101 mg/dL — ABNORMAL HIGH (ref 65–99)

## 2017-11-25 LAB — MAGNESIUM: Magnesium: 1.7 mg/dL (ref 1.7–2.4)

## 2017-11-25 LAB — TROPONIN I
TROPONIN I: 0.71 ng/mL — AB (ref <0.03)
Troponin I: 0.46 ng/mL (ref <0.03)

## 2017-11-25 MED ORDER — POTASSIUM CHLORIDE CRYS ER 20 MEQ PO TBCR
40.0000 meq | EXTENDED_RELEASE_TABLET | ORAL | Status: AC
  Administered 2017-11-25 (×2): 40 meq via ORAL
  Filled 2017-11-25 (×2): qty 2

## 2017-11-25 MED ORDER — SODIUM CHLORIDE 0.9 % IV SOLN: INTRAVENOUS | Status: DC

## 2017-11-25 MED ORDER — FUROSEMIDE 40 MG PO TABS
40.0000 mg | ORAL_TABLET | Freq: Every day | ORAL | Status: DC
  Administered 2017-11-27: 40 mg via ORAL
  Filled 2017-11-25 (×2): qty 1

## 2017-11-25 MED ORDER — SODIUM CHLORIDE 0.9 % IV SOLN
INTRAVENOUS | Status: DC
  Administered 2017-11-26: 06:00:00 via INTRAVENOUS

## 2017-11-25 MED ORDER — LIVING BETTER WITH HEART FAILURE BOOK: Freq: Once | Status: DC

## 2017-11-25 MED ORDER — GABAPENTIN 300 MG PO CAPS
600.0000 mg | ORAL_CAPSULE | Freq: Every day | ORAL | Status: DC
  Administered 2017-11-25 – 2017-11-26 (×2): 600 mg via ORAL
  Filled 2017-11-25 (×2): qty 2

## 2017-11-25 MED ORDER — GABAPENTIN 300 MG PO CAPS
300.0000 mg | ORAL_CAPSULE | Freq: Two times a day (BID) | ORAL | Status: DC
  Administered 2017-11-25 – 2017-11-27 (×3): 300 mg via ORAL
  Filled 2017-11-25 (×4): qty 1

## 2017-11-25 NOTE — Progress Notes (Signed)
Gave report to AMY RN on 300

## 2017-11-25 NOTE — Progress Notes (Addendum)
Given later nature of the day and that patient has already eaten lunch, will plan on cath tomorrow - this is on the board for 1:30pm, orders written. Pt will need to be transferred to [facility] tomorrow morning in prep for this to occur. She prefers to remain at [facility] today to be closer to family. Per d/w Dr. Harrington Challenger, team can help facilitate transfer tomorrow. Per our discussion will hold further Lasix, give 50 cc/hr NS starting in AM. May need to hold further KCl if potassium level is adequate on tomorrow's BMET. Dayna Dunn PA-C

## 2017-11-25 NOTE — Progress Notes (Signed)
PROGRESS NOTE    [place]  AOZ:[id number] DOB: 11/08/1946 DOA: 11/23/2017 PCP: Bernette Redbird, MD   Brief Narrative:  71 y.o. 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  Patient had reportedly been doing fairly well earlier in the day with no recent cough, chest pain, fevers, or chills.  She raised concern of bystanders when she was noted to be sitting in her car and apparent respiratory distress in [place] parking lot.  EMS came to check on her, found her to be in acute distress, put her on the monitor, suspect of the she may be in ventricular tachycardia, administered Cullin Dishman shock, and transported her to the hospital.  Patient denies any chest pain or palpitations, reports of dyspnea, but no cough or fever.  Reports chronic bilateral lower extremity swelling.  Reports history of CHF, and acknowledges some dietary indiscretions over the past day or so as she has been visiting local family for the holidays from her usual residence near [place]:   Principal Problem:   Acute on chronic diastolic CHF (congestive heart failure) ([place]) Active Problems:   Hypertension   Left bundle branch block   CAD (coronary artery disease)   Breast cancer ([place])   Acute respiratory failure with hypoxia ([place])   1. Acute on chronic diastolic CHF; acute hypoxic respiratory failure  - Pt presents with acute respiratory distress requiring BiPAP, found to have diffuse crackles and edema on CXR, elevated BNP  - Treated in ED with Lasix 80 mg IV and nitroglycerin infusion; she has made Kialee Kham dramatic improvement in ED with this  - Lasix 40 IV BID ordered -> lasix 40 mg PO daily starting tomorrow (already had 1 IV dose today) - now off bipap - pt on losartan and  atenolol - TTE (12/15/16) with EF 50%, mild LVH, grade 1 diastolic dysfunction, mild pulm HTN, no significant valvular disease   _0  repeat echo today with EF 96-29%, grade 1 diastolic dysfunction, mild AS, PA peak pressure 32 mmHg - Notes her weight is typically 263-265 (currently 259)  Wt Readings from Last 3 Encounters:  11/25/17 117.9 kg (259 lb 14.8 oz)    2.  Elevated troponin:  She denies CP throughout this episode, only shortness of breath.  Troponin rising from negative to 0.78 to 1.17 and has now downtrended.  On heparin gtt.  ASA given.  Discussed with cardiology (Dr. Radford Pax) who recommended follow up echo and if stable consult cardiology at [facility] tomorrow.  Will continue heparin gtt for now until discussing with cardiology, but suspect this was likely demand with her lack of CP.  3. CAD; chronic LBBB  - No anginal complaints, but troponin rising as above - Reports hx of stents placed at [facility] and has Delaney Schnick chronic LBBB - Suspect her presentation was secondary to dietary indiscretions - Continue cardiac monitoring, continue ASA, beta-blocker, and ARB    4. Hypertension  - BP elevated to 190/120 range initially, improved with IV lasix and nitroglycerin - Continue beta-blocker and ARB, use hydralazine IVP's prn.  Low threshold to increase regimen.  D/c HCTZ and start lasix daily on discharge.   - Add or increase to regimen as needed.  5. Breast cancer  - Right UOQ mass with thoracic spine met, following at [facility]  - Continue Letrozole  6. Hyperglycemia  - No hx of DM and no A1c on file  - Serum glucose 294 on admission  - Follow CBG's and use Teesha Ohm SSI with Novolog as needed    DVT prophylaxis: heparin Code Status: full  Family Communication: none at bedside Disposition Plan: pending    Consultants:   Cardiology  Procedures: (Don't include imaging studies which can be auto populated. Include things that cannot be auto populated i.e. Echo, Carotid and venous dopplers, Foley,  Bipap, HD, tubes/drains, wound vac, central lines etc)  pending  Antimicrobials: (specify start and planned stop date. Auto populated tables are space occupying and do not give end dates)  none    Subjective: Feels much better, pretty much back to normal.   Objective: Vitals:   11/25/17 0400 11/25/17 0500 11/25/17 0600 11/25/17 0645  BP: (!) 165/76 132/60 (!) 148/64   Pulse: 67 71 64 67  Resp: 20 (!) 23 (!) 21 17  Temp:      TempSrc:      SpO2: 100% 97% 99% 99%  Weight:    117.9 kg (259 lb 14.8 oz)  Height:        Intake/Output Summary (Last 24 hours) at 11/25/2017 0829 Last data filed at 11/25/2017 0600 Gross per 24 hour  Intake 1651.25 ml  Output 1300 ml  Net 351.25 ml   Filed Weights   11/24/17 0045 11/24/17 0430 11/25/17 0645  Weight: 120.1 kg (264 lb 12.4 oz) 121.9 kg (268 lb 11.9 oz) 117.9 kg (259 lb 14.8 oz)    Examination:  General: No acute distress. Cardiovascular: Heart sounds show Carlisia Geno regular rate, and rhythm. No gallops or rubs. No murmurs. No JVD. Lungs: Clear to auscultation bilaterally with good air movement. No rales, rhonchi or wheezes. Abdomen: Soft, nontender, nondistended with normal active bowel sounds. No masses. No hepatosplenomegaly. Neurological: Alert and oriented 3. Moves all extremities 4 with equal strength. Cranial nerves II through XII grossly intact. Skin: Warm and dry. No rashes or lesions. Extremities: No clubbing or cyanosis. 1+ edema bilaterally Psychiatric: Mood and affect are normal. Insight and judgment are appropriate.   Data Reviewed: I have personally reviewed following labs and imaging studies  CBC: Recent Labs  Lab 11/23/17 1900 11/23/17 1901 11/25/17 0544  WBC 15.0*  --  4.7  NEUTROABS 6.7  --   --   HGB 14.4 16.0* 12.3  HCT 47.2* 47.0* 39.8  MCV 100.4*  --  97.5  PLT 248  --  628   Basic Metabolic Panel: Recent Labs  Lab 11/23/17 1900 11/23/17 1901 11/24/17 0706 11/25/17 0544  NA 144 142 142 141  K  3.8 3.9 3.7 3.2*  CL 104 103 102 99*  CO2 22  --  30 31  GLUCOSE 294* 288* 124* 119*  BUN 20 24* 20 23*  CREATININE 0.93 0.80 0.72 0.76  CALCIUM 9.2  --  8.3* 8.6*   GFR: Estimated Creatinine Clearance: 75.5 mL/min (by C-G formula based on SCr of 0.76 mg/dL). Liver Function Tests: Recent Labs  Lab 11/23/17 1900  AST 48*  ALT 35  ALKPHOS 80  BILITOT 0.9  PROT 7.5  ALBUMIN 3.7   No results for input(s): LIPASE, AMYLASE in the last 168 hours. No results for input(s): AMMONIA in the last 168 hours. Coagulation Profile: Recent Labs  Lab 11/23/17 1900  INR 0.95   Cardiac Enzymes: Recent Labs  Lab 11/24/17 0106 11/24/17 0706 11/24/17 1441 11/24/17 1953 11/25/17 0216  TROPONINI 0.78* 1.17* 0.90* 0.73* 0.71*  BNP (last 3 results) No results for input(s): PROBNP in the last 8760 hours. HbA1C: No results for input(s): HGBA1C in the last 72 hours. CBG: Recent Labs  Lab 11/24/17 0718 11/24/17 1155 11/24/17 1657 11/24/17 2136 11/25/17 0819  GLUCAP 106* 108* 93 100* 133*   Lipid Profile: No results for input(s): CHOL, HDL, LDLCALC, TRIG, CHOLHDL, LDLDIRECT in the last 72 hours. Thyroid Function Tests: No results for input(s): TSH, T4TOTAL, FREET4, T3FREE, THYROIDAB in the last 72 hours. Anemia Panel: No results for input(s): VITAMINB12, FOLATE, FERRITIN, TIBC, IRON, RETICCTPCT in the last 72 hours. Sepsis Labs: No results for input(s): PROCALCITON, LATICACIDVEN in the last 168 hours.  Recent Results (from the past 240 hour(s))  MRSA PCR Screening     Status: None   Collection Time: 11/24/17 12:17 AM  Result Value Ref Range Status   MRSA by PCR NEGATIVE NEGATIVE Final    Comment:        The GeneXpert MRSA Assay (FDA approved for NASAL specimens only), is one component of Dejion Grillo comprehensive MRSA colonization surveillance program. It is not intended to diagnose MRSA infection nor to guide or monitor treatment for MRSA infections.          Radiology  Studies: Dg Chest Portable 1 View  Result Date: 11/23/2017 CLINICAL DATA:  Respiratory distress. EXAM: PORTABLE CHEST 1 VIEW COMPARISON:  None. FINDINGS: Lungs are adequately inflated with hazy central bilateral airspace opacification which may be due to interstitial edema versus infection. No definite effusion. Cardiomediastinal silhouette is within normal. There is mild degenerate change of the spine. IMPRESSION: Hazy bilateral central airspace opacification which may be due to interstitial edema versus infection. Electronically Signed   By: Marin Olp M.D.   On: 11/23/2017 19:09        Scheduled Meds: . aspirin EC  325 mg Oral Daily  . atenolol  25 mg Oral Daily  . atorvastatin  80 mg Oral q1800  . calcium-vitamin D  1 tablet Oral BID  . chlorhexidine  15 mL Mouth Rinse BID  . furosemide  40 mg Intravenous Q12H  . hydrochlorothiazide  25 mg Oral Daily  . insulin aspart  0-15 Units Subcutaneous TID WC  . insulin aspart  0-5 Units Subcutaneous QHS  . letrozole  2.5 mg Oral Daily  . Living Better with Heart Failure Book   Does not apply Once  . losartan  50 mg Oral Daily  . mouth rinse  15 mL Mouth Rinse q12n4p  . multivitamin with minerals  1 tablet Oral Daily  . potassium chloride  20 mEq Oral Daily  . potassium chloride  40 mEq Oral Q4H  . sodium chloride flush  3 mL Intravenous Q12H   Continuous Infusions: . sodium chloride    . heparin 900 Units/hr (11/24/17 0415)     LOS: 2 days    Time spent: over 85 min    Fayrene Helper, MD [facility] [phone]  If 7PM-7AM, please contact night-coverage [email or web address] Password [facility] 11/25/2017, 8:29 AM

## 2017-11-25 NOTE — Consult Note (Signed)
Cardiology Consultation:   Patient ID: [place]; [id number]; 02-01-1946   Admit date: 11/23/2017 Date of Consult: 11/25/2017  Primary Care Provider: Bernette Redbird, MD Primary Cardiologist: Dr. Chryl Heck ([place]) - pt lives in [facility]  Chief Complaint: short of breath  Patient Profile:   Stacey [name] is a 71 y.o. female with a hx of breast CA with seketal metastasis on neoadjuvent chemotherapy, pulm nodules (followed by [facility]), chronic pain, chronic leg edema, CAD (DES to ramus 2005 with late stent thrombosis 2006 tx with PTCA), super morbid obesity, known LBBB, PSVT noted on event monitor 2/14, HTN, HLD, mild AS (this adm) who is being seen today for the evaluation of NSTEMI/?Vtach at the request of Dr. Florene Glen.  History of Present Illness:   Cardiology notes reviewed from [place] PCI was above. Per their notes, cath in 2006 also showed diffuse moderate LAD. 2 day nuclear stress test in 12/2016 done for left shoulder/arm pain/dyspnea showed no ischemia or infarction, EF 46%, LBBB. Last onc note 08/2017 indicated stable disease with plan for continued oral letrozole; there was recommendation to consider colonoscopy or digital rectal exam for new rectal lesions but patient preferred to follow with repeat CT in several weeks. She reports chronic dyspnea but attributes this to being extremely sedentary. She was unaware of any formal diagnosis of CHF but reports chronic edema L>R following old surgery on that leg.   Prior to the  day of admission, the patient has noticed some increased dyspnea on exertion  Uses walker  Denies CP  She has attrib dyspnea to deconditioning  . She was visiting family here in the area (lives in [place]). She did notice when leaving her sister's house and while riding electric cart in [place] she felt more SOB. When she got to her car, her dyspnea worsened and she felt urgent need to urinate. She wet herself. From there the  dyspnea escalated and she states she "literally felt like I was about to die." She did not think she had enough time for 2 phone calls so she called her son to make him aware of what was happening and asked him to call 911. Per ED notes, EMS placed patient on monitor. Triage notes indicate this was felt to be ventricular tachycardia and 1 shock was administered with conversion to NSR. Initial HR unclear. When she arrived to the ED she was still tachycardic with HR 150 (initial EKG sinus tach 128), RR 40, arrived non nonrebreather. She was also markedly hypertensive. She was started on NTG drip and IV Lasix with subsequent improvement in HR and BP. Initial troponin normal, then rose to peak of 1.17 with subsequent downtrend. Initial WBC 15k (no normal), K 3.8 (3.2 today), BNP 589, Mg pending. CXR hazy,? interstitial edema versus infection.  2D echo 12/25 showed EF 50-55%, grade 1 DD, mild aortic stenosis. She was started on IV Lasix BID with improvement in BPs to 130-160 range. She denies any chest pain whatsoever. She reports feeling completely back to baseline now.    Past Medical History:  Diagnosis Date  . CAD in native artery    a.DES to ramus 2005 with late stent thrombosis 2006 tx with PTCA.  Marland Kitchen CHF (congestive heart failure) ([place])   . Chronic pain   . Hyperlipidemia   . Hypertension   . LBBB (left bundle branch block)   . Left bundle branch block   . Lymphedema   . Metastatic breast cancer ([place])  a. to bone.  . MI (myocardial infarction) ([place])   . Mild aortic stenosis 10/2017  . Morbid obesity ([place])   . PSVT (paroxysmal supraventricular tachycardia) ([place])    a. per [facility] notes, seen on event monitor in 2014.  . Pulmonary nodules     Past Surgical History:  Procedure Laterality Date  . CORONARY STENT PLACEMENT       Inpatient Medications: Scheduled Meds: . aspirin EC  325 mg Oral Daily  . atenolol  25 mg Oral Daily  . atorvastatin  80 mg Oral q1800  . calcium-vitamin D  1  tablet Oral BID  . chlorhexidine  15 mL Mouth Rinse BID  . furosemide  40 mg Intravenous Q12H  . hydrochlorothiazide  25 mg Oral Daily  . insulin aspart  0-15 Units Subcutaneous TID WC  . insulin aspart  0-5 Units Subcutaneous QHS  . letrozole  2.5 mg Oral Daily  . Living Better with Heart Failure Book   Does not apply Once  . losartan  50 mg Oral Daily  . mouth rinse  15 mL Mouth Rinse q12n4p  . multivitamin with minerals  1 tablet Oral Daily  . potassium chloride  20 mEq Oral Daily  . potassium chloride  40 mEq Oral Q4H  . sodium chloride flush  3 mL Intravenous Q12H   Continuous Infusions: . sodium chloride    . heparin 900 Units/hr (11/24/17 0415)   PRN Meds: sodium chloride, acetaminophen, ALPRAZolam, hydrALAZINE, ondansetron (ZOFRAN) IV, oxyCODONE, sodium chloride flush  Home Meds: Prior to Admission medications   Medication Sig Start Date End Date Taking? Authorizing Provider  aspirin EC 325 MG tablet Take 325 mg by mouth daily.   Yes [provider]  atenolol (TENORMIN) 25 MG tablet Take 25 mg by mouth daily.   Yes [provider]  atorvastatin (LIPITOR) 80 MG tablet Take 80 mg by mouth daily.   Yes [provider]  Calcium-Phosphorus-Vitamin D (CITRACAL +D3) 250-107-500 MG-MG-UNIT CHEW Chew 2 tablets by mouth 2 (two) times daily.   Yes [provider]  hydrochlorothiazide (HYDRODIURIL) 25 MG tablet Take 25 mg by mouth daily.   Yes [provider]  letrozole (FEMARA) 2.5 MG tablet Take 2.5 mg by mouth daily.   Yes [provider]  losartan (COZAAR) 50 MG tablet Take 50 mg by mouth daily.   Yes [provider]  Multiple Vitamin (MULTIVITAMIN WITH MINERALS) TABS tablet Take 1 tablet by mouth daily.   Yes [provider]  Oxycodone HCl 10 MG TABS Take 10 mg by mouth every 6 (six) hours as needed (for pain).   Yes [provider]  Potassium 99 MG TABS Take 1 tablet by mouth daily.   Yes [provider]    Allergies:   No Known Allergies  Social History:   Social History   Socioeconomic History  . Marital status: Single    Spouse name: Not on file  . Number of children: Not on file  . Years of education: Not on file  . Highest education level: Not on file  Social Needs  . Financial resource strain: Not on file  . Food insecurity - worry: Not on file  . Food insecurity - inability: Not on file  . Transportation needs - medical: Not on file  . Transportation needs - non-medical: Not on file  Occupational History  . Not on file  Tobacco Use  . Smoking status: Former Research scientist (life sciences)  . Smokeless tobacco: Never Used  Substance and Sexual Activity  . Alcohol use: No    Frequency: Never  . Drug use: No  . Sexual activity: Not on file  Other Topics Concern  . Not on file  Social History Narrative  . Not on file    Family History:   The patient's family history is negative for Sudden Cardiac Death.  ROS:  Please see the history of present illness.  All other ROS reviewed and negative.     Physical Exam/Data:   Vitals:   11/25/17 0600 11/25/17 0645 11/25/17 0700 11/25/17 0800  BP: (!) 148/64  (!) 148/66 (!) 164/60  Pulse: 64 67 65 (!) 102  Resp: (!) 21 17 20  (!) 29  Temp:      TempSrc:      SpO2: 99% 99% 100% 97%  Weight:  259 lb 14.8 oz (117.9 kg)    Height:        Intake/Output Summary (Last 24 hours) at 11/25/2017 0919 Last data filed at 11/25/2017 0800 Gross per 24 hour  Intake 1175.25 ml  Output 2400 ml  Net -1224.75 ml   Filed Weights   11/24/17 0045 11/24/17 0430 11/25/17 0645  Weight: 264 lb 12.4 oz (120.1 kg) 268 lb 11.9 oz (121.9 kg) 259 lb 14.8 oz (117.9 kg)   Body mass index is 52.5 kg/m.  General: Well developed, well nourished morbidly obese WF, in no acute distress. Head: Normocephalic, atraumatic, sclera non-icteric, no xanthomas, nares are without discharge.  Neck: Negative for carotid bruits. JVD not elevated. Lungs: Clear  bilaterally to auscultation without wheezes, rales, or rhonchi. Breathing is unlabored. Heart: RRR with S1 S2. No murmurs, rubs, or gallops appreciated. Abdomen: Soft, non-tender, non-distended with normoactive bowel sounds. No hepatomegaly. No rebound/guarding. No obvious abdominal masses. Msk:  Strength and tone appear normal for age. Extremities: No clubbing or cyanosis. No edema.  Distal pedal pulses are 2+ and equal bilaterally. Neuro: Alert and oriented X 3. No facial asymmetry. No focal deficit. Moves all extremities spontaneously. Psych:  Responds to questions appropriately with a normal affect.  EKG:  The EKG was personally reviewed and demonstrates sinus tach 128bpm with LBBB  Laboratory Data:  Chemistry Recent Labs  Lab 11/23/17 1900 11/23/17 1901 11/24/17 0706 11/25/17 0544  NA 144 142 142 141  K 3.8 3.9 3.7 3.2*  CL 104 103 102 99*  CO2 22  --  30 31  GLUCOSE 294* 288* 124* 119*  BUN 20 24* 20 23*  CREATININE 0.93 0.80 0.72 0.76  CALCIUM 9.2  --  8.3* 8.6*  GFRNONAA >60  --  >60 >60  GFRAA >60  --  >60 >60  ANIONGAP 18*  --  10 11    Recent Labs  Lab 11/23/17 1900  PROT 7.5  ALBUMIN 3.7  AST 48*  ALT 35  ALKPHOS 80  BILITOT 0.9   Hematology Recent Labs  Lab 11/23/17 1900 11/23/17 1901 11/25/17 0544  WBC 15.0*  --  4.7  RBC 4.70  --  4.08  HGB 14.4 16.0* 12.3  HCT 47.2* 47.0* 39.8  MCV 100.4*  --  97.5  MCH 30.6  --  30.1  MCHC 30.5  --  30.9  RDW 13.9  --  14.2  PLT 248  --  176   Cardiac Enzymes Recent Labs  Lab 11/23/17 1900 11/24/17 0106 11/24/17 0706 11/24/17 1441 11/24/17 1953 11/25/17 0216  TROPONINI <0.03 0.78* 1.17* 0.90* 0.73* 0.71*   No results for input(s): TROPIPOC in the last 168  hours.  BNP Recent Labs  Lab 11/23/17 1900  BNP 589.0*    DDimer No results for input(s): DDIMER in the last 168 hours.  Radiology/Studies:  Dg Chest Portable 1 View  Result Date: 11/23/2017 CLINICAL DATA:  Respiratory distress. EXAM:  PORTABLE CHEST 1 VIEW COMPARISON:  None. FINDINGS: Lungs are adequately inflated with hazy central bilateral airspace opacification which may be due to interstitial edema versus infection. No definite effusion. Cardiomediastinal silhouette is within normal. There is mild degenerate change of the spine. IMPRESSION: Hazy bilateral central airspace opacification which may be due to interstitial edema versus infection. Electronically Signed   By: Marin Olp M.D.   On: 11/23/2017 19:09    Assessment and Plan:   1. Tachycardia   SVT vs VTach - no strips available. Given LBBB it is unclear if this was true ventricular tachycardia but may indeed be recurrence of previously known PSVT. Will add TSH to labs and will plan to review with Dr. Harrington Challenger in the context of her troponin elevation as well. Given age and continued higher BPs, could consider changing atenolol to either metoprolol or carvedilol. She also reports snoring and denies prior testing for OSA. Her obesity puts her at greater susceptibility for atrial arrhythmias thus would consider sleep study as OP.  2. NSTEMI with prior history of CAD - Pt never had CP prior to first cath  Now with weeks of increased dyspnea on exertion. Event yesterday  Now Peak trop 1.2  With noted increased dyspnea on exertion would recomm L hart cath to clarify/define  Pt agrees with plan  Plan for tomorrow.   4. Breast CA - per oncology notes, felt relatively stable, being monitored regularly.  5. Hypokalemia - primary team managing, Mg also pending. Wrote care order for nursing to contact IM for repletion if level is 1.8 or less.   For questions or updates, please contact [place] Please consult [email or web address] for contact info under Cardiology/STEMI.    Signed, Charlie Pitter, PA-C  11/25/2017 9:19 AM

## 2017-11-25 NOTE — Consult Note (Signed)
Cardiology Consultation:   Patient ID: [place]; [id number]; Feb 06, 1946   Admit date: 11/23/2017 Date of Consult: 11/25/2017  Primary Care Provider: Bernette Redbird, MD Primary Cardiologist: Dr. Chryl Heck ([place]) - pt lives in [facility]  Chief Complaint: short of breath  Patient Profile:   Stacey [name] is a 71 y.o. female with a hx of breast CA with seketal metastasis on neoadjuvent chemotherapy, pulm nodules (followed by [facility]), chronic pain, chronic leg edema, CAD (DES to ramus 2005 with late stent thrombosis 2006 tx with PTCA), super morbid obesity, known LBBB, PSVT noted on event monitor 2/14, HTN, HLD, mild AS (this adm) who is being seen today for the evaluation of NSTEMI/?Vtach at the request of Dr. Florene Glen.  History of Present Illness:   Cardiology notes reviewed from [place] PCI was above. Per their notes, cath in 2006 also showed diffuse moderate LAD. 2 day nuclear stress test in 12/2016 done for left shoulder/arm pain/dyspnea showed no ischemia or infarction, EF 46%, LBBB. Last onc note 08/2017 indicated stable disease with plan for continued oral letrozole; there was recommendation to consider colonoscopy or digital rectal exam for new rectal lesions but patient preferred to follow with repeat CT in several weeks. She reports chronic dyspnea but attributes this to being extremely sedentary. She was unaware of any formal diagnosis of CHF but reports chronic edema L>R following old surgery on that leg.   Prior to the  day of admission, the patient has noticed some increased dyspnea on exertion  Uses walker  Denies CP  She has attrib dyspnea to deconditioning  . She was visiting family here in the area (lives in [place]). She did notice when leaving her sister's house and while riding electric cart in [place] she felt more SOB. When she got to her car, her dyspnea worsened and she felt urgent need to urinate. She wet herself. From there the  dyspnea escalated and she states she "literally felt like I was about to die." She did not think she had enough time for 2 phone calls so she called her son to make him aware of what was happening and asked him to call 911. Per ED notes, EMS placed patient on monitor. Triage notes indicate this was felt to be ventricular tachycardia and 1 shock was administered with conversion to NSR. Initial HR unclear. When she arrived to the ED she was still tachycardic with HR 150 (initial EKG sinus tach 128), RR 40, arrived non nonrebreather. She was also markedly hypertensive. She was started on NTG drip and IV Lasix with subsequent improvement in HR and BP. Initial troponin normal, then rose to peak of 1.17 with subsequent downtrend. Initial WBC 15k (no normal), K 3.8 (3.2 today), BNP 589, Mg pending. CXR hazy,? interstitial edema versus infection.  2D echo 12/25 showed EF 50-55%, grade 1 DD, mild aortic stenosis. She was started on IV Lasix BID with improvement in BPs to 130-160 range. She denies any chest pain whatsoever. She reports feeling completely back to baseline now.    Past Medical History:  Diagnosis Date  . CAD in native artery    a.DES to ramus 2005 with late stent thrombosis 2006 tx with PTCA.  Marland Kitchen CHF (congestive heart failure) ([place])   . Chronic pain   . Hyperlipidemia   . Hypertension   . LBBB (left bundle branch block)   . Left bundle branch block   . Lymphedema   . Metastatic breast cancer ([place])  a. to bone.  . MI (myocardial infarction) ([place])   . Mild aortic stenosis 10/2017  . Morbid obesity ([place])   . PSVT (paroxysmal supraventricular tachycardia) ([place])    a. per [facility] notes, seen on event monitor in 2014.  . Pulmonary nodules     Past Surgical History:  Procedure Laterality Date  . CORONARY STENT PLACEMENT       Inpatient Medications: Scheduled Meds: . aspirin EC  325 mg Oral Daily  . atenolol  25 mg Oral Daily  . atorvastatin  80 mg Oral q1800  . calcium-vitamin D  1  tablet Oral BID  . chlorhexidine  15 mL Mouth Rinse BID  . furosemide  40 mg Intravenous Q12H  . hydrochlorothiazide  25 mg Oral Daily  . insulin aspart  0-15 Units Subcutaneous TID WC  . insulin aspart  0-5 Units Subcutaneous QHS  . letrozole  2.5 mg Oral Daily  . Living Better with Heart Failure Book   Does not apply Once  . losartan  50 mg Oral Daily  . mouth rinse  15 mL Mouth Rinse q12n4p  . multivitamin with minerals  1 tablet Oral Daily  . potassium chloride  20 mEq Oral Daily  . potassium chloride  40 mEq Oral Q4H  . sodium chloride flush  3 mL Intravenous Q12H   Continuous Infusions: . sodium chloride    . heparin 900 Units/hr (11/24/17 0415)   PRN Meds: sodium chloride, acetaminophen, ALPRAZolam, hydrALAZINE, ondansetron (ZOFRAN) IV, oxyCODONE, sodium chloride flush  Home Meds: Prior to Admission medications   Medication Sig Start Date End Date Taking? Authorizing Provider  aspirin EC 325 MG tablet Take 325 mg by mouth daily.   Yes [provider]  atenolol (TENORMIN) 25 MG tablet Take 25 mg by mouth daily.   Yes [provider]  atorvastatin (LIPITOR) 80 MG tablet Take 80 mg by mouth daily.   Yes [provider]  Calcium-Phosphorus-Vitamin D (CITRACAL +D3) 250-107-500 MG-MG-UNIT CHEW Chew 2 tablets by mouth 2 (two) times daily.   Yes [provider]  hydrochlorothiazide (HYDRODIURIL) 25 MG tablet Take 25 mg by mouth daily.   Yes [provider]  letrozole (FEMARA) 2.5 MG tablet Take 2.5 mg by mouth daily.   Yes [provider]  losartan (COZAAR) 50 MG tablet Take 50 mg by mouth daily.   Yes [provider]  Multiple Vitamin (MULTIVITAMIN WITH MINERALS) TABS tablet Take 1 tablet by mouth daily.   Yes [provider]  Oxycodone HCl 10 MG TABS Take 10 mg by mouth every 6 (six) hours as needed (for pain).   Yes [provider]  Potassium 99 MG TABS Take 1 tablet by mouth daily.   Yes [provider]    Allergies:   No Known Allergies  Social History:   Social History   Socioeconomic History  . Marital status: Single    Spouse name: Not on file  . Number of children: Not on file  . Years of education: Not on file  . Highest education level: Not on file  Social Needs  . Financial resource strain: Not on file  . Food insecurity - worry: Not on file  . Food insecurity - inability: Not on file  . Transportation needs - medical: Not on file  . Transportation needs - non-medical: Not on file  Occupational History  . Not on file  Tobacco Use  . Smoking status: Former Research scientist (life sciences)  . Smokeless tobacco: Never Used  Substance and Sexual Activity  . Alcohol use: No    Frequency: Never  . Drug use: No  . Sexual activity: Not on file  Other Topics Concern  . Not on file  Social History Narrative  . Not on file    Family History:   The patient's family history is negative for Sudden Cardiac Death.  ROS:  Please see the history of present illness.  All other ROS reviewed and negative.     Physical Exam/Data:   Vitals:   11/25/17 0600 11/25/17 0645 11/25/17 0700 11/25/17 0800  BP: (!) 148/64  (!) 148/66 (!) 164/60  Pulse: 64 67 65 (!) 102  Resp: (!) 21 17 20  (!) 29  Temp:      TempSrc:      SpO2: 99% 99% 100% 97%  Weight:  259 lb 14.8 oz (117.9 kg)    Height:        Intake/Output Summary (Last 24 hours) at 11/25/2017 0919 Last data filed at 11/25/2017 0800 Gross per 24 hour  Intake 1175.25 ml  Output 2400 ml  Net -1224.75 ml   Filed Weights   11/24/17 0045 11/24/17 0430 11/25/17 0645  Weight: 264 lb 12.4 oz (120.1 kg) 268 lb 11.9 oz (121.9 kg) 259 lb 14.8 oz (117.9 kg)   Body mass index is 52.5 kg/m.  General: Well developed, well nourished morbidly obese WF, in no acute distress. Head: Normocephalic, atraumatic, sclera non-icteric, no xanthomas, nares are without discharge.  Neck: Negative for carotid bruits. JVD not elevated. Lungs: Clear  bilaterally to auscultation without wheezes, rales, or rhonchi. Breathing is unlabored. Heart: RRR with S1 S2. No murmurs, rubs, or gallops appreciated. Abdomen: Soft, non-tender, non-distended with normoactive bowel sounds. No hepatomegaly. No rebound/guarding. No obvious abdominal masses. Msk:  Strength and tone appear normal for age. Extremities: No clubbing or cyanosis. No edema.  Distal pedal pulses are 2+ and equal bilaterally. Neuro: Alert and oriented X 3. No facial asymmetry. No focal deficit. Moves all extremities spontaneously. Psych:  Responds to questions appropriately with a normal affect.  EKG:  The EKG was personally reviewed and demonstrates sinus tach 128bpm with LBBB  Laboratory Data:  Chemistry Recent Labs  Lab 11/23/17 1900 11/23/17 1901 11/24/17 0706 11/25/17 0544  NA 144 142 142 141  K 3.8 3.9 3.7 3.2*  CL 104 103 102 99*  CO2 22  --  30 31  GLUCOSE 294* 288* 124* 119*  BUN 20 24* 20 23*  CREATININE 0.93 0.80 0.72 0.76  CALCIUM 9.2  --  8.3* 8.6*  GFRNONAA >60  --  >60 >60  GFRAA >60  --  >60 >60  ANIONGAP 18*  --  10 11    Recent Labs  Lab 11/23/17 1900  PROT 7.5  ALBUMIN 3.7  AST 48*  ALT 35  ALKPHOS 80  BILITOT 0.9   Hematology Recent Labs  Lab 11/23/17 1900 11/23/17 1901 11/25/17 0544  WBC 15.0*  --  4.7  RBC 4.70  --  4.08  HGB 14.4 16.0* 12.3  HCT 47.2* 47.0* 39.8  MCV 100.4*  --  97.5  MCH 30.6  --  30.1  MCHC 30.5  --  30.9  RDW 13.9  --  14.2  PLT 248  --  176   Cardiac Enzymes Recent Labs  Lab 11/23/17 1900 11/24/17 0106 11/24/17 0706 11/24/17 1441 11/24/17 1953 11/25/17 0216  TROPONINI <0.03 0.78* 1.17* 0.90* 0.73* 0.71*   No results for input(s): TROPIPOC in the last 168  hours.  BNP Recent Labs  Lab 11/23/17 1900  BNP 589.0*    DDimer No results for input(s): DDIMER in the last 168 hours.  Radiology/Studies:  Dg Chest Portable 1 View  Result Date: 11/23/2017 CLINICAL DATA:  Respiratory distress. EXAM:  PORTABLE CHEST 1 VIEW COMPARISON:  None. FINDINGS: Lungs are adequately inflated with hazy central bilateral airspace opacification which may be due to interstitial edema versus infection. No definite effusion. Cardiomediastinal silhouette is within normal. There is mild degenerate change of the spine. IMPRESSION: Hazy bilateral central airspace opacification which may be due to interstitial edema versus infection. Electronically Signed   By: Marin Olp M.D.   On: 11/23/2017 19:09    Assessment and Plan:   1. Tachycardia   SVT vs VTach - no strips available. Given LBBB it is unclear if this was true ventricular tachycardia but may indeed be recurrence of previously known PSVT. Will add TSH to labs and will plan to review with Dr. Harrington Challenger in the context of her troponin elevation as well. Given age and continued higher BPs, could consider changing atenolol to either metoprolol or carvedilol. She also reports snoring and denies prior testing for OSA. Her obesity puts her at greater susceptibility for atrial arrhythmias thus would consider sleep study as OP.  2. NSTEMI with prior history of CAD - Pt never had CP prior to first cath  Now with weeks of increased dyspnea on exertion. Event yesterday  Now Peak trop 1.2  With noted increased dyspnea on exertion would recomm L hart cath to clarify/define  Pt agrees with plan  Plan for tomorrow.   4. Breast CA - per oncology notes, felt relatively stable, being monitored regularly.  5. Hypokalemia - primary team managing, Mg also pending. Wrote care order for nursing to contact IM for repletion if level is 1.8 or less.   For questions or updates, please contact [place] Please consult [email or web address] for contact info under Cardiology/STEMI.    Signed, Charlie Pitter, PA-C  11/25/2017 9:19 AM  Pt seen and examined  I have amended note above to reflect my findings  Pt is a 71 yo morbidly obese pt with history of CAD   Never had CP  Had myocardial infarct  in 2006  Underwent DES to ramus at [facility]   Had repeat intervention a couple years later  No CP   Pt with reported PSVT on event monitor (few beats)  None sustained  Also history of LBBB  Myovue without ischemia  Presents after episode at [facility] as noted above  Now feeling better   No arrhythmia here   Strips from EMS not located.  ? VT vs SVT with LBBB Troponin are elevated mildly  Pt does note increased DOE over past few months.  Activty down  On exam:  Pt morbidly obese  Neck full  Lungs no rales or wheezes  Cardiac exam:  RRR  No S3 or signif murmurs  Ext with tr edema   Given presentation and worsening SOB and known CAD I would recomm L heart cath to redefine anatomy  Risks / benefits explained  Pt understands and wants to proced    Continue tele for arrhythmia.    Dorris Carnes

## 2017-11-25 NOTE — Progress Notes (Signed)
ANTICOAGULATION CONSULT NOTE   Pharmacy Consult for Heparin Indication: ACS/STEMI  No Known Allergies  Patient Measurements: Height: 4\' 11"  (149.9 cm) Weight: 259 lb 14.8 oz (117.9 kg) IBW/kg (Calculated) : 43.2 HEPARIN DW (KG): 73.8   Vital Signs: Temp: 98.3 F (36.8 C) (12/26 0000) Temp Source: Oral (12/26 0000) BP: 148/64 (12/26 0600) Pulse Rate: 67 (12/26 0645)  Labs: Recent Labs    11/23/17 1900 11/23/17 1901  11/24/17 0331 11/24/17 0706 11/24/17 1141 11/24/17 1441 11/24/17 1953 11/25/17 0216 11/25/17 0544  HGB 14.4 16.0*  --   --   --   --   --   --   --   --   HCT 47.2* 47.0*  --   --   --   --   --   --   --   --   PLT 248  --   --   --   --   --   --   --   --   --   APTT  --   --   --  31  --   --   --   --   --   --   LABPROT 12.6  --   --   --   --   --   --   --   --   --   INR 0.95  --   --   --   --   --   --   --   --   --   HEPARINUNFRC  --   --   --   --   --  0.42  --   --   --  0.38  CREATININE 0.93 0.80  --   --  0.72  --   --   --   --  0.76  TROPONINI <0.03  --    < >  --  1.17*  --  0.90* 0.73* 0.71*  --    < > = values in this interval not displayed.   Estimated Creatinine Clearance: 75.5 mL/min (by C-G formula based on SCr of 0.76 mg/dL).  Medical History: Past Medical History:  Diagnosis Date  . CHF (congestive heart failure) ([place])   . Hypertension   . Left bundle branch block   . MI (myocardial infarction) ([place])     Medications:  Enoxaparin 40 mg SQ at 2200 11/23/17 in the ED  Assessment: 71 yo morbidly obese female brought to the ED by EMS with acute respiratory distress and elevated BP. Hx of chronic diastolic CHF. Troponin is now elevated. Pharmacy has been consulted for IV heparin dosing. Heparin level remains therapeutic this am.  Goal of Therapy:  Heparin goal level: 0.3-0.7 units/ml Monitor platelets by anticoagulation protocol   Plan:  Continue Heparin infusion at 900 units/hr Heparin level and CBC daily while on  heparin Monitor for bleeding complications  Isac Sarna, BS Vena [place], BCPS Clinical Pharmacist Pager [phone] 11/25/2017,7:44 AM

## 2017-11-25 NOTE — Plan of Care (Signed)
Patient readily cognitive of needs and how to make sure she is getting better.

## 2017-11-26 ENCOUNTER — Encounter (HOSPITAL_COMMUNITY)
Admission: EM | Disposition: A | Discharge status: Home / Self Care | Payer: Self-pay | Source: Home / Self Care | Attending: Family Medicine

## 2017-11-26 DIAGNOSIS — I214 Non-ST elevation (NSTEMI) myocardial infarction: Secondary | ICD-10-CM

## 2017-11-26 HISTORY — PX: LEFT HEART CATH AND CORONARY ANGIOGRAPHY: CATH118249

## 2017-11-26 HISTORY — PX: CORONARY STENT INTERVENTION: CATH118234

## 2017-11-26 LAB — POCT ACTIVATED CLOTTING TIME
ACTIVATED CLOTTING TIME: 428 s
ACTIVATED CLOTTING TIME: 445 s
Activated Clotting Time: 268 seconds
Activated Clotting Time: 263 seconds

## 2017-11-26 LAB — GLUCOSE, CAPILLARY
GLUCOSE-CAPILLARY: 113 mg/dL — AB (ref 65–99)
GLUCOSE-CAPILLARY: 89 mg/dL (ref 65–99)
GLUCOSE-CAPILLARY: 142 mg/dL — AB (ref 65–99)

## 2017-11-26 LAB — BASIC METABOLIC PANEL
Anion gap: 13 (ref 5–15)
BUN: 31 mg/dL — AB (ref 6–20)
CALCIUM: 9.9 mg/dL (ref 8.9–10.3)
CHLORIDE: 98 mmol/L — AB (ref 101–111)
CO2: 30 mmol/L (ref 22–32)
CREATININE: 0.78 mg/dL (ref 0.44–1.00)
GFR calc Af Amer: >60 mL/min (ref >60)
GFR calc non Af Amer: >60 mL/min (ref >60)
GLUCOSE: 113 mg/dL — AB (ref 65–99)
Potassium: 4 mmol/L (ref 3.5–5.1)
Sodium: 141 mmol/L (ref 135–145)

## 2017-11-26 LAB — CBC
HEMATOCRIT: 41.9 % (ref 36.0–46.0)
Hemoglobin: 13.1 g/dL (ref 12.0–15.0)
MCH: 30.5 pg (ref 26.0–34.0)
MCHC: 31.3 g/dL (ref 30.0–36.0)
MCV: 97.7 fL (ref 78.0–100.0)
PLATELETS: 214 10*3/uL (ref 150–400)
RBC: 4.29 MIL/uL (ref 3.87–5.11)
RDW: 14.3 % (ref 11.5–15.5)
WBC: 5.2 10*3/uL (ref 4.0–10.5)

## 2017-11-26 LAB — TSH: TSH: 3.23 u[IU]/mL (ref 0.350–4.500)

## 2017-11-26 LAB — HEPARIN LEVEL (UNFRACTIONATED): Heparin Unfractionated: 0.38 IU/mL (ref 0.30–0.70)

## 2017-11-26 SURGERY — LEFT HEART CATH AND CORONARY ANGIOGRAPHY
Anesthesia: LOCAL

## 2017-11-26 MED ORDER — SODIUM CHLORIDE 0.9 % IV SOLN: 250.0000 mL | INTRAVENOUS | Status: DC | PRN

## 2017-11-26 MED ORDER — IOPAMIDOL (ISOVUE-370) INJECTION 76%
INTRAVENOUS | Status: AC
  Filled 2017-11-26: qty 100

## 2017-11-26 MED ORDER — ASPIRIN 81 MG PO CHEW: 81.0000 mg | CHEWABLE_TABLET | ORAL | Status: DC

## 2017-11-26 MED ORDER — LABETALOL HCL 5 MG/ML IV SOLN
10.0000 mg | INTRAVENOUS | Status: AC | PRN
  Administered 2017-11-26: 21:00:00 10 mg via INTRAVENOUS
  Filled 2017-11-26: qty 4

## 2017-11-26 MED ORDER — SODIUM CHLORIDE 0.9% FLUSH: 3.0000 mL | INTRAVENOUS | Status: DC | PRN

## 2017-11-26 MED ORDER — HEPARIN (PORCINE) IN NACL 2-0.9 UNIT/ML-% IJ SOLN
INTRAMUSCULAR | Status: DC | PRN
  Administered 2017-11-26: 10 mL via INTRA_ARTERIAL

## 2017-11-26 MED ORDER — NITROGLYCERIN 1 MG/10 ML FOR IR/CATH LAB
INTRA_ARTERIAL | Status: AC
  Filled 2017-11-26: qty 10

## 2017-11-26 MED ORDER — SODIUM CHLORIDE 0.9% FLUSH: 3.0000 mL | Freq: Two times a day (BID) | INTRAVENOUS | Status: DC

## 2017-11-26 MED ORDER — TICAGRELOR 90 MG PO TABS
90.0000 mg | ORAL_TABLET | Freq: Two times a day (BID) | ORAL | Status: DC
  Administered 2017-11-27: 90 mg via ORAL
  Filled 2017-11-26: qty 1

## 2017-11-26 MED ORDER — VERAPAMIL HCL 2.5 MG/ML IV SOLN
INTRAVENOUS | Status: AC
  Filled 2017-11-26: qty 2

## 2017-11-26 MED ORDER — HEPARIN (PORCINE) IN NACL 2-0.9 UNIT/ML-% IJ SOLN
INTRAMUSCULAR | Status: AC
  Filled 2017-11-26: qty 1000

## 2017-11-26 MED ORDER — HEPARIN SODIUM (PORCINE) 1000 UNIT/ML IJ SOLN
INTRAMUSCULAR | Status: AC
  Filled 2017-11-26: qty 1

## 2017-11-26 MED ORDER — TICAGRELOR 90 MG PO TABS
ORAL_TABLET | ORAL | Status: AC
  Filled 2017-11-26: qty 2

## 2017-11-26 MED ORDER — LIDOCAINE HCL (PF) 1 % IJ SOLN
INTRAMUSCULAR | Status: DC | PRN
  Administered 2017-11-26: 2 mL

## 2017-11-26 MED ORDER — SODIUM CHLORIDE 0.9 % IV SOLN
INTRAVENOUS | Status: AC | PRN
  Administered 2017-11-26: 117 mL/kg/h via INTRAVENOUS

## 2017-11-26 MED ORDER — HEPARIN (PORCINE) IN NACL 2-0.9 UNIT/ML-% IJ SOLN
INTRAMUSCULAR | Status: AC | PRN
  Administered 2017-11-26: 1000 mL

## 2017-11-26 MED ORDER — LIDOCAINE HCL (PF) 1 % IJ SOLN
INTRAMUSCULAR | Status: AC
  Filled 2017-11-26: qty 30

## 2017-11-26 MED ORDER — IOPAMIDOL (ISOVUE-370) INJECTION 76%
INTRAVENOUS | Status: DC | PRN
  Administered 2017-11-26: 200 mL via INTRA_ARTERIAL

## 2017-11-26 MED ORDER — FENTANYL CITRATE (PF) 100 MCG/2ML IJ SOLN
INTRAMUSCULAR | Status: DC | PRN
  Administered 2017-11-26: 25 ug via INTRAVENOUS

## 2017-11-26 MED ORDER — SODIUM CHLORIDE 0.9 % WEIGHT BASED INFUSION: 1.0000 mL/kg/h | INTRAVENOUS | Status: AC

## 2017-11-26 MED ORDER — FENTANYL CITRATE (PF) 100 MCG/2ML IJ SOLN
INTRAMUSCULAR | Status: AC
  Filled 2017-11-26: qty 2

## 2017-11-26 MED ORDER — HEPARIN SODIUM (PORCINE) 1000 UNIT/ML IJ SOLN
INTRAMUSCULAR | Status: DC | PRN
  Administered 2017-11-26: 5500 [IU] via INTRAVENOUS
  Administered 2017-11-26: 5000 [IU] via INTRAVENOUS
  Administered 2017-11-26: 2000 [IU] via INTRAVENOUS

## 2017-11-26 MED ORDER — NITROGLYCERIN 1 MG/10 ML FOR IR/CATH LAB
INTRA_ARTERIAL | Status: DC | PRN
  Administered 2017-11-26: 200 ug via INTRACORONARY

## 2017-11-26 MED ORDER — ASPIRIN 81 MG PO CHEW
81.0000 mg | CHEWABLE_TABLET | Freq: Every day | ORAL | Status: DC
  Administered 2017-11-27: 81 mg via ORAL
  Filled 2017-11-26: qty 1

## 2017-11-26 MED ORDER — MIDAZOLAM HCL 2 MG/2ML IJ SOLN
INTRAMUSCULAR | Status: DC | PRN
  Administered 2017-11-26: 1 mg via INTRAVENOUS

## 2017-11-26 MED ORDER — HYDRALAZINE HCL 20 MG/ML IJ SOLN: 5.0000 mg | INTRAMUSCULAR | Status: AC | PRN

## 2017-11-26 MED ORDER — MIDAZOLAM HCL 2 MG/2ML IJ SOLN
INTRAMUSCULAR | Status: AC
  Filled 2017-11-26: qty 2

## 2017-11-26 MED ORDER — SODIUM CHLORIDE 0.9 % IV SOLN
250.0000 mL | INTRAVENOUS | Status: DC | PRN
Start: 2017-11-26 — End: 2017-11-26

## 2017-11-26 MED ORDER — TICAGRELOR 90 MG PO TABS
ORAL_TABLET | ORAL | Status: DC | PRN
  Administered 2017-11-26: 180 mg via ORAL

## 2017-11-26 SURGICAL SUPPLY — 23 items
BALLN EMERGE MR 2.0X12 (BALLOONS) ×2
BALLN MINITREK OTW 2.0X12 (BALLOONS) ×2
BALLOON EMERGE MR 2.0X12 (BALLOONS) ×1 IMPLANT
BALLOON MINITREK OTW 2.0X12 (BALLOONS) ×1 IMPLANT
CATH 5FR JL3.5 JR4 ANG PIG MP (CATHETERS) ×2 IMPLANT
CATH LAUNCHER 6FR JR4 (CATHETERS) ×2 IMPLANT
CATH SUPERCROSS ANGLED 90 DEG (MICROCATHETER) ×2 IMPLANT
CATH VISTA GUIDE 6FR 3DRC (CATHETERS) ×2 IMPLANT
DEVICE RAD COMP TR BAND LRG (VASCULAR PRODUCTS) ×2 IMPLANT
GLIDESHEATH SLEND SS 6F .021 (SHEATH) ×2 IMPLANT
GUIDELINER 6F (CATHETERS) ×2 IMPLANT
GUIDEWIRE INQWIRE 1.5J.035X260 (WIRE) ×1 IMPLANT
INQWIRE 1.5J .035X260CM (WIRE) ×2
KIT ENCORE 26 ADVANTAGE (KITS) ×2 IMPLANT
KIT HEART LEFT (KITS) ×2 IMPLANT
PACK CARDIAC CATHETERIZATION (CUSTOM PROCEDURE TRAY) ×2 IMPLANT
STENT SYNERGY DES 2.5X16 (Permanent Stent) ×2 IMPLANT
TRANSDUCER W/STOPCOCK (MISCELLANEOUS) ×2 IMPLANT
TUBING CIL FLEX 10 FLL-RA (TUBING) ×2 IMPLANT
WIRE ASAHI FIELDER XT 300CM (WIRE) ×2 IMPLANT
WIRE ASAHI PROWATER 180CM (WIRE) ×2 IMPLANT
WIRE HITORQ VERSACORE ST 145CM (WIRE) ×2 IMPLANT
WIRE PT2 MS 185 (WIRE) ×2 IMPLANT

## 2017-11-26 NOTE — Interval H&P Note (Signed)
History and Physical Interval Note:  11/26/2017 2:51 PM  Stacey [name]  has presented today for surgery, with the diagnosis of Nstemi  The various methods of treatment have been discussed with the patient and family. After consideration of risks, benefits and other options for treatment, the patient has consented to  Procedure(s): LEFT HEART CATH AND CORONARY ANGIOGRAPHY (N/A) as a surgical intervention .  The patient's history has been reviewed, patient examined, no change in status, stable for surgery.  I have reviewed the patient's chart and labs.  Questions were answered to the patient's satisfaction.   Cath Lab Visit (complete for each Cath Lab visit)  Clinical Evaluation Leading to the Procedure:   ACS: Yes.    Non-ACS:    Anginal Classification: CCS IV  Anti-ischemic medical therapy: Minimal Therapy (1 class of medications)  Non-Invasive Test Results: No non-invasive testing performed  Prior CABG: No previous CABG        Stacey [name] 11/26/2017 2:52 PM

## 2017-11-26 NOTE — Progress Notes (Signed)
Progress Note  Patient Name: Stacey [name] Reasons Date of Encounter: 11/26/2017  Primary Cardiologist: Dr. Chryl Heck [facility])  Subjective  No complaints of chest pain or dyspnea   Inpatient Medications    Scheduled Meds: . aspirin EC  325 mg Oral Daily  . atenolol  25 mg Oral Daily  . atorvastatin  80 mg Oral q1800  . calcium-vitamin D  1 tablet Oral BID  . chlorhexidine  15 mL Mouth Rinse BID  . furosemide  40 mg Oral Daily  . gabapentin  300 mg Oral BID  . gabapentin  600 mg Oral QHS  . hydrochlorothiazide  25 mg Oral Daily  . insulin aspart  0-15 Units Subcutaneous TID WC  . insulin aspart  0-5 Units Subcutaneous QHS  . letrozole  2.5 mg Oral Daily  . Living Better with Heart Failure Book   Does not apply Once  . losartan  50 mg Oral Daily  . mouth rinse  15 mL Mouth Rinse q12n4p  . multivitamin with minerals  1 tablet Oral Daily  . potassium chloride  20 mEq Oral Daily  . sodium chloride flush  3 mL Intravenous Q12H   Continuous Infusions: . sodium chloride    . sodium chloride 50 mL/hr at 11/26/17 0546  . heparin 900 Units/hr (11/25/17 1031)   PRN Meds: sodium chloride, acetaminophen, ALPRAZolam, hydrALAZINE, ondansetron (ZOFRAN) IV, oxyCODONE, sodium chloride flush   Vital Signs    Vitals:   11/25/17 1000 11/25/17 1100 11/25/17 2216 11/26/17 0505  BP:   (!) 151/71 (!) 141/85  Pulse: 80 76 79 81  Resp: 18 14 20    Temp:   98 F (36.7 C) (!) 97.5 F (36.4 C)  TempSrc:   Oral   SpO2: 96% 95% 98% 94%  Weight:    258 lb 4.8 oz (117.2 kg)  Height:        Intake/Output Summary (Last 24 hours) at 11/26/2017 0741 Last data filed at 11/26/2017 0513 Gross per 24 hour  Intake 285 ml  Output 1900 ml  Net -1615 ml   Filed Weights   11/24/17 0430 11/25/17 0645 11/26/17 0505  Weight: 268 lb 11.9 oz (121.9 kg) 259 lb 14.8 oz (117.9 kg) 258 lb 4.8 oz (117.2 kg)    Telemetry    NSR. Frequent artifact - Personally Reviewed  Physical Exam   GEN: No acute distress.   Morbidly obese Neck: No JVD. Obese. No thyromegaly.  Cardiac: RRR, no murmurs, rubs, or gallops.  Respiratory: Clear to auscultation bilaterally. GI: Soft, nontender, non-distended  MS: No edema; No deformity.Pulses are diminished bilateral DP, and PT.  Neuro:  Nonfocal  Psych: Normal affect   Labs    Chemistry Recent Labs  Lab 11/23/17 1900  11/24/17 0706 11/25/17 0544 11/26/17 0525  NA 144   < > 142 141 141  K 3.8   < > 3.7 3.2* 4.0  CL 104   < > 102 99* 98*  CO2 22  --  30 31 30   GLUCOSE 294*   < > 124* 119* 113*  BUN 20   < > 20 23* 31*  CREATININE 0.93   < > 0.72 0.76 0.78  CALCIUM 9.2  --  8.3* 8.6* 9.9  PROT 7.5  --   --   --   --   ALBUMIN 3.7  --   --   --   --   AST 48*  --   --   --   --   ALT  35  --   --   --   --   ALKPHOS 80  --   --   --   --   BILITOT 0.9  --   --   --   --   GFRNONAA >60  --  >60 >60 >60  GFRAA >60  --  >60 >60 >60  ANIONGAP 18*  --  10 11 13    < > = values in this interval not displayed.     Hematology Recent Labs  Lab 11/23/17 1900 11/23/17 1901 11/25/17 0544 11/26/17 0525  WBC 15.0*  --  4.7 5.2  RBC 4.70  --  4.08 4.29  HGB 14.4 16.0* 12.3 13.1  HCT 47.2* 47.0* 39.8 41.9  MCV 100.4*  --  97.5 97.7  MCH 30.6  --  30.1 30.5  MCHC 30.5  --  30.9 31.3  RDW 13.9  --  14.2 14.3  PLT 248  --  176 214    Cardiac Enzymes Recent Labs  Lab 11/24/17 1441 11/24/17 1953 11/25/17 0216 11/25/17 0823  TROPONINI 0.90* 0.73* 0.71* 0.46*   No results for input(s): TROPIPOC in the last 168 hours.   BNP Recent Labs  Lab 11/23/17 1900  BNP 589.0*      Radiology    No results found.  Cardiac Studies  Echocardiogram 11/24/2017 Left ventricle: The cavity size was normal. There was mild   concentric hypertrophy. Systolic function was normal. The   estimated ejection fraction was in the range of 50% to 55%. There   was an increased relative contribution of atrial contraction to   ventricular filling. Doppler parameters are  consistent with   abnormal left ventricular relaxation (grade 1 diastolic   dysfunction). - Aortic valve: There was mild stenosis. Valve area (VTI): 1.66   cm^2. Valve area (Vmax): 1.71 cm^2. Valve area (Vmean): 1.62   cm^2. - Mitral valve: Calcified annulus. Mildly thickened leaflets . - Pulmonary arteries: PA peak pressure: 32 mm Hg (S).   Patient Profile     71 y.o. female with a hx of breast CA with seketal metastasis on neoadjuvent chemotherapy, pulm nodules (followed by [facility]), chronic pain, chronic leg edema, CAD (DES to ramus 2005 with late stent thrombosis 2006 tx with PTCA), super morbid obesity, known LBBB, PSVT noted on event monitor 2/14, HTN, HLD, mild AS (this adm) who is being seen for evaluation of NSTEMI/Vtach at the request of Dr. Florene Glen, Hospitalist Service, Patient was found to be in VT and required on shock with conversion to NSR in the field per EMS. Planned for transfer today for cardiac cath at [facility], 1:30 pm   Assessment & Plan    1. NSTEMI: Troponin is elevated 0/73; 0.71; and 0.46 respectively. She is without chest pain or dyspnea. She remains on heparin. Planned for cardiac cath at [facility] around 1:30 pm, Cath orders are completed, confirmed with cath lab and [facility]. I have discussed cardiac cath procedure with the patient and need to transfer to [facility].   The patient understands that risks include but are not limited to stroke (1 in 1000), death (1 in 67), kidney failure [usually temporary] (1 in 500), bleeding (1 in 200), allergic reaction [possibly serious] (1 in 200), and agrees to proceed.    2. Coronary Artery Disease: Known history of DES to ramus, PTCA with late stent thrombosis in 2006. She remains on ASA, and statin therapy.   3.. Chronic LBBB: Found to be tachycardic on admission. No  strips to confirm VT. Currently on atenolol for HR control.   Signed, Phill Myron. West Pugh, ANP, AACC   11/26/2017, 7:41 AM     Pt seen as being  transferred to [facility] for Cath Comfortable today  No CP  Breathing OK  Exam deferred as on gurney  Agree with plans for cath as pt was shocked in parking lot at [facility] 2 days ago   Wide complex tach May have been SVT vs VT  No strips  Will try to retrieve Also, pt notes increase Dyspnea, fatigue with activity over the recent past     Dorris Carnes

## 2017-11-26 NOTE — Progress Notes (Signed)
[facility].  Admitted for acute hypoxic resp failure thought 2/2 acute on chronic diastolic HF.  She had elevated troponin as well as possible VT on transport with EMS.  [facility] and will be taken onto the cards team as primary.

## 2017-11-26 NOTE — Progress Notes (Signed)
ANTICOAGULATION CONSULT NOTE   Pharmacy Consult for Heparin Indication: ACS/STEMI  No Known Allergies  Patient Measurements: Height: 4\' 11"  (149.9 cm) Weight: 258 lb 4.8 oz (117.2 kg) IBW/kg (Calculated) : 43.2 HEPARIN DW (KG): 73.8  Vital Signs: Temp: 97.5 F (36.4 C) (12/27 0505) Temp Source: Oral (12/26 2216) BP: 141/85 (12/27 0505) Pulse Rate: 81 (12/27 0505)  Labs: Recent Labs    11/23/17 1900 11/23/17 1901  11/24/17 0331 11/24/17 0706 11/24/17 1141  11/24/17 1953 11/25/17 0216 11/25/17 0544 11/25/17 0823 11/26/17 0525  HGB 14.4 16.0*  --   --   --   --   --   --   --  12.3  --  13.1  HCT 47.2* 47.0*  --   --   --   --   --   --   --  39.8  --  41.9  PLT 248  --   --   --   --   --   --   --   --  176  --  214  APTT  --   --   --  31  --   --   --   --   --   --   --   --   LABPROT 12.6  --   --   --   --   --   --   --   --   --   --   --   INR 0.95  --   --   --   --   --   --   --   --   --   --   --   HEPARINUNFRC  --   --   --   --   --  0.42  --   --   --  0.38  --  0.38  CREATININE 0.93 0.80  --   --  0.72  --   --   --   --  0.76  --  0.78  TROPONINI <0.03  --    < >  --  1.17*  --    < > 0.73* 0.71*  --  0.46*  --    < > = values in this interval not displayed.   Estimated Creatinine Clearance: 75.2 mL/min (by C-G formula based on SCr of 0.78 mg/dL).  Medical History: Past Medical History:  Diagnosis Date  . CAD in native artery    a.DES to ramus 2005 with late stent thrombosis 2006 tx with PTCA.  Marland Kitchen CHF (congestive heart failure) ([place])   . Chronic pain   . Hyperlipidemia   . Hypertension   . LBBB (left bundle branch block)   . Left bundle branch block   . Lymphedema   . Metastatic breast cancer ([place])    a. to bone.  . MI (myocardial infarction) ([place])   . Mild aortic stenosis 10/2017  . Morbid obesity ([place])   . PSVT (paroxysmal supraventricular tachycardia) ([place])    a. per [facility] notes, seen on event monitor in 2014.  . Pulmonary nodules     Medications:  Enoxaparin 40 mg SQ at 2200 11/23/17 in the ED  Assessment: 71 yo morbidly obese female brought to the ED by EMS with acute respiratory distress and elevated BP. Hx of chronic diastolic CHF. Troponin elevated. Pharmacy has been consulted for IV heparin dosing. Heparin level remains therapeutic this am.  Goal of Therapy:  Heparin goal level: 0.3-0.7 units/ml  Monitor platelets by anticoagulation protocol   Plan:  Continue Heparin infusion at 900 units/hr Heparin level and CBC daily while on heparin Monitor for bleeding complications  Hart Robinsons, PharmD Clinical Pharmacist Pager:  [phone] 11/26/2017   11/26/2017,9:59 AM

## 2017-11-26 NOTE — Progress Notes (Signed)
Patient arrived on the unit from [facility] via [facility] , assessment completed see flowsheet , patient oriented to room and staff, placed on tele ccmd notified, bed in lowest position, call bell in reach will continue to monitor.

## 2017-11-26 NOTE — Progress Notes (Signed)
Report called to [place] on [address] [place] at [facility], report given to [place] from  [place].

## 2017-11-26 NOTE — Progress Notes (Signed)
Patient transferred to [place] via [facility]. 

## 2017-11-26 NOTE — H&P (View-Only) (Signed)
Progress Note  Patient Name: Stacey [name] Date of Encounter: 11/26/2017  Primary Cardiologist: Dr. Chryl Heck [facility])  Subjective  No complaints of chest pain or dyspnea   Inpatient Medications    Scheduled Meds: . aspirin EC  325 mg Oral Daily  . atenolol  25 mg Oral Daily  . atorvastatin  80 mg Oral q1800  . calcium-vitamin D  1 tablet Oral BID  . chlorhexidine  15 mL Mouth Rinse BID  . furosemide  40 mg Oral Daily  . gabapentin  300 mg Oral BID  . gabapentin  600 mg Oral QHS  . hydrochlorothiazide  25 mg Oral Daily  . insulin aspart  0-15 Units Subcutaneous TID WC  . insulin aspart  0-5 Units Subcutaneous QHS  . letrozole  2.5 mg Oral Daily  . Living Better with Heart Failure Book   Does not apply Once  . losartan  50 mg Oral Daily  . mouth rinse  15 mL Mouth Rinse q12n4p  . multivitamin with minerals  1 tablet Oral Daily  . potassium chloride  20 mEq Oral Daily  . sodium chloride flush  3 mL Intravenous Q12H   Continuous Infusions: . sodium chloride    . sodium chloride 50 mL/hr at 11/26/17 0546  . heparin 900 Units/hr (11/25/17 1031)   PRN Meds: sodium chloride, acetaminophen, ALPRAZolam, hydrALAZINE, ondansetron (ZOFRAN) IV, oxyCODONE, sodium chloride flush   Vital Signs    Vitals:   11/25/17 1000 11/25/17 1100 11/25/17 2216 11/26/17 0505  BP:   (!) 151/71 (!) 141/85  Pulse: 80 76 79 81  Resp: 18 14 20    Temp:   98 F (36.7 C) (!) 97.5 F (36.4 C)  TempSrc:   Oral   SpO2: 96% 95% 98% 94%  Weight:    258 lb 4.8 oz (117.2 kg)  Height:        Intake/Output Summary (Last 24 hours) at 11/26/2017 0741 Last data filed at 11/26/2017 0513 Gross per 24 hour  Intake 285 ml  Output 1900 ml  Net -1615 ml   Filed Weights   11/24/17 0430 11/25/17 0645 11/26/17 0505  Weight: 268 lb 11.9 oz (121.9 kg) 259 lb 14.8 oz (117.9 kg) 258 lb 4.8 oz (117.2 kg)    Telemetry    NSR. Frequent artifact - Personally Reviewed  Physical Exam   GEN: No acute distress.   Morbidly obese Neck: No JVD. Obese. No thyromegaly.  Cardiac: RRR, no murmurs, rubs, or gallops.  Respiratory: Clear to auscultation bilaterally. GI: Soft, nontender, non-distended  MS: No edema; No deformity.Pulses are diminished bilateral DP, and PT.  Neuro:  Nonfocal  Psych: Normal affect   Labs    Chemistry Recent Labs  Lab 11/23/17 1900  11/24/17 0706 11/25/17 0544 11/26/17 0525  NA 144   < > 142 141 141  K 3.8   < > 3.7 3.2* 4.0  CL 104   < > 102 99* 98*  CO2 22  --  30 31 30   GLUCOSE 294*   < > 124* 119* 113*  BUN 20   < > 20 23* 31*  CREATININE 0.93   < > 0.72 0.76 0.78  CALCIUM 9.2  --  8.3* 8.6* 9.9  PROT 7.5  --   --   --   --   ALBUMIN 3.7  --   --   --   --   AST 48*  --   --   --   --   ALT  35  --   --   --   --   ALKPHOS 80  --   --   --   --   BILITOT 0.9  --   --   --   --   GFRNONAA >60  --  >60 >60 >60  GFRAA >60  --  >60 >60 >60  ANIONGAP 18*  --  10 11 13    < > = values in this interval not displayed.     Hematology Recent Labs  Lab 11/23/17 1900 11/23/17 1901 11/25/17 0544 11/26/17 0525  WBC 15.0*  --  4.7 5.2  RBC 4.70  --  4.08 4.29  HGB 14.4 16.0* 12.3 13.1  HCT 47.2* 47.0* 39.8 41.9  MCV 100.4*  --  97.5 97.7  MCH 30.6  --  30.1 30.5  MCHC 30.5  --  30.9 31.3  RDW 13.9  --  14.2 14.3  PLT 248  --  176 214    Cardiac Enzymes Recent Labs  Lab 11/24/17 1441 11/24/17 1953 11/25/17 0216 11/25/17 0823  TROPONINI 0.90* 0.73* 0.71* 0.46*   No results for input(s): TROPIPOC in the last 168 hours.   BNP Recent Labs  Lab 11/23/17 1900  BNP 589.0*      Radiology    No results found.  Cardiac Studies  Echocardiogram 11/24/2017 Left ventricle: The cavity size was normal. There was mild   concentric hypertrophy. Systolic function was normal. The   estimated ejection fraction was in the range of 50% to 55%. There   was an increased relative contribution of atrial contraction to   ventricular filling. Doppler parameters are  consistent with   abnormal left ventricular relaxation (grade 1 diastolic   dysfunction). - Aortic valve: There was mild stenosis. Valve area (VTI): 1.66   cm^2. Valve area (Vmax): 1.71 cm^2. Valve area (Vmean): 1.62   cm^2. - Mitral valve: Calcified annulus. Mildly thickened leaflets . - Pulmonary arteries: PA peak pressure: 32 mm Hg (S).   Patient Profile     71 y.o. female with a hx of breast CA with seketal metastasis on neoadjuvent chemotherapy, pulm nodules (followed by [facility]), chronic pain, chronic leg edema, CAD (DES to ramus 2005 with late stent thrombosis 2006 tx with PTCA), super morbid obesity, known LBBB, PSVT noted on event monitor 2/14, HTN, HLD, mild AS (this adm) who is being seen for evaluation of NSTEMI/Vtach at the request of Dr. Florene Glen, Hospitalist Service, Patient was found to be in VT and required on shock with conversion to NSR in the field per EMS. Planned for transfer today for cardiac cath at [facility], 1:30 pm   Assessment & Plan    1. NSTEMI: Troponin is elevated 0/73; 0.71; and 0.46 respectively. She is without chest pain or dyspnea. She remains on heparin. Planned for cardiac cath at [facility] around 1:30 pm, Cath orders are completed, confirmed with cath lab and [facility]. I have discussed cardiac cath procedure with the patient and need to transfer to [facility].   The patient understands that risks include but are not limited to stroke (1 in 1000), death (1 in 2), kidney failure [usually temporary] (1 in 500), bleeding (1 in 200), allergic reaction [possibly serious] (1 in 200), and agrees to proceed.    2. Coronary Artery Disease: Known history of DES to ramus, PTCA with late stent thrombosis in 2006. She remains on ASA, and statin therapy.   3.. Chronic LBBB: Found to be tachycardic on admission. No  strips to confirm VT. Currently on atenolol for HR control.   Signed, Phill Myron. West Pugh, ANP, AACC   11/26/2017, 7:41 AM     Pt seen as being  transferred to [facility] for Cath Comfortable today  No CP  Breathing OK  Exam deferred as on gurney  Agree with plans for cath as pt was shocked in parking lot at [facility] 2 days ago   Wide complex tach May have been SVT vs VT  No strips  Will try to retrieve Also, pt notes increase Dyspnea, fatigue with activity over the recent past     Dorris Carnes

## 2017-11-26 NOTE — Progress Notes (Signed)
TR BAND REMOVAL  LOCATION:    R radial  DEFLATED PER PROTOCOL:    YES  TIME BAND OFF / DRESSING APPLIED:   22:40  SITE UPON ARRIVAL:    Level 0  SITE AFTER BAND REMOVAL:    Level 1 Bruising Pt states" My wrist was black and blue before the procedure."  CIRCULATION SENSATION AND MOVEMENT:    Within Normal Limits   YES  COMMENTS:   Pt tolerated procedure well. Had an issue with bleeding initially.Pressure dressing was applied above TR Band. Post removal band instructions provided. RUE elevated on pillow.

## 2017-11-27 ENCOUNTER — Encounter (HOSPITAL_COMMUNITY): Payer: Self-pay | Admitting: Cardiology

## 2017-11-27 DIAGNOSIS — Z955 Presence of coronary angioplasty implant and graft: Secondary | ICD-10-CM

## 2017-11-27 DIAGNOSIS — I25110 Atherosclerotic heart disease of native coronary artery with unstable angina pectoris: Secondary | ICD-10-CM

## 2017-11-27 DIAGNOSIS — R0603 Acute respiratory distress: Secondary | ICD-10-CM

## 2017-11-27 LAB — GLUCOSE, CAPILLARY
GLUCOSE-CAPILLARY: 72 mg/dL (ref 65–99)
Glucose-Capillary: 98 mg/dL (ref 65–99)

## 2017-11-27 LAB — CBC
HCT: 37.5 % (ref 36.0–46.0)
HEMOGLOBIN: 11.8 g/dL — AB (ref 12.0–15.0)
MCH: 30.1 pg (ref 26.0–34.0)
MCHC: 31.5 g/dL (ref 30.0–36.0)
MCV: 95.7 fL (ref 78.0–100.0)
Platelets: 189 10*3/uL (ref 150–400)
RBC: 3.92 MIL/uL (ref 3.87–5.11)
RDW: 14.5 % (ref 11.5–15.5)
WBC: 5.8 10*3/uL (ref 4.0–10.5)

## 2017-11-27 LAB — BASIC METABOLIC PANEL
ANION GAP: 11 (ref 5–15)
BUN: 25 mg/dL — AB (ref 6–20)
CALCIUM: 9.4 mg/dL (ref 8.9–10.3)
CO2: 28 mmol/L (ref 22–32)
CREATININE: 0.92 mg/dL (ref 0.44–1.00)
Chloride: 100 mmol/L — ABNORMAL LOW (ref 101–111)
GFR calc Af Amer: >60 mL/min (ref >60)
GLUCOSE: 120 mg/dL — AB (ref 65–99)
Potassium: 3.7 mmol/L (ref 3.5–5.1)
Sodium: 139 mmol/L (ref 135–145)

## 2017-11-27 MED ORDER — TICAGRELOR 90 MG PO TABS: 90.0000 mg | ORAL_TABLET | Freq: Two times a day (BID) | ORAL | 2 refills | Status: DC

## 2017-11-27 MED ORDER — NITROGLYCERIN 0.4 MG SL SUBL: 0.4000 mg | SUBLINGUAL_TABLET | SUBLINGUAL | 0 refills | Status: DC | PRN

## 2017-11-27 MED ORDER — ANGIOPLASTY BOOK
Freq: Once | Status: AC
  Administered 2017-11-27: 05:00:00 1
  Filled 2017-11-27: qty 1

## 2017-11-27 MED ORDER — TICAGRELOR 90 MG PO TABS: 90.0000 mg | ORAL_TABLET | Freq: Two times a day (BID) | ORAL | 0 refills | Status: DC

## 2017-11-27 MED ORDER — NITROGLYCERIN 0.4 MG SL SUBL: 0.4000 mg | SUBLINGUAL_TABLET | SUBLINGUAL | 2 refills | Status: DC | PRN

## 2017-11-27 MED ORDER — ASPIRIN 81 MG PO CHEW: 81.0000 mg | CHEWABLE_TABLET | Freq: Every day | ORAL | Status: DC

## 2017-11-27 NOTE — Progress Notes (Signed)
CARDIAC REHAB PHASE I   PRE:  Rate/Rhythm: 86 SR  BP:  Supine:   Sitting: 154/72  Standing:    SaO2: 97%RA  MODE:  Ambulation: 400 ft   POST:  Rate/Rhythm: 111 ST  BP:  Supine:   Sitting: 204/80, 148/72  Standing:    SaO2: 93-95%RA 0850-1000 Pt walked 400 ft on RA with rolling walker with steady gait. Stopped a couple of times to rest. MI and CHF ed completed with pt who voiced understanding. Gave low sodium diets and discussed 2000 mg restriction. Reviewed daily weights,CHF signs and when to call MD. Reviewed NTG use, walking as tolerated and importance of brilinta. Needs to see case manager. Discussed CRP 2 and pt would like to have referral to [facility]. Will refer.    Graylon Good, RN BSN  11/27/2017 9:54 AM

## 2017-11-27 NOTE — Progress Notes (Signed)
Patient going home on Brilinta; coupon card given to patient with explanation of usage; Aneta Mins [phone]

## 2017-11-27 NOTE — Discharge Summary (Signed)
Discharge Summary    Patient ID: Stacey [name],  MRN: [id number], DOB/AGE: 05-25-1946 71 y.o.  Admit date: 11/23/2017 Discharge date: 11/27/2017  Primary Care Provider: Bernette [name] Primary Cardiologist: Stacey [name]  Discharge Diagnoses    Principal Problem:   Acute on chronic diastolic CHF (congestive heart failure) [name]) Active Problems:   Non-ST elevation (NSTEMI) myocardial infarction [name])   Hypertension   Left bundle branch block   CAD (coronary artery disease)   Breast cancer ([name])   Acute respiratory failure with hypoxia ([name])   Allergies No Known Allergies  Diagnostic Studies/Procedures    TTE: 11/24/17  Study Conclusions  - Left ventricle: The cavity size was normal. There was mild   concentric hypertrophy. Systolic function was normal. The   estimated ejection fraction was in the range of 50% to 55%. There   was an increased relative contribution of atrial contraction to   ventricular filling. Doppler parameters are consistent with   abnormal left ventricular relaxation (grade 1 diastolic   dysfunction). - Aortic valve: There was mild stenosis. Valve area (VTI): 1.66   cm^2. Valve area (Vmax): 1.71 cm^2. Valve area (Vmean): 1.62   cm^2. - Mitral valve: Calcified annulus. Mildly thickened leaflets . - Pulmonary arteries: PA peak pressure: 32 mm Hg (S).  Cath: 11/26/17  Conclusion     Ost Ramus to Ramus lesion is 100% stenosed.  Mid RCA lesion is 90% stenosed.  A drug-eluting stent was successfully placed using a STENT SYNERGY DES 2.5X16.  Post intervention, there is a 0% residual stenosis.  LV end diastolic pressure is normal.   1. 2 vessel obstructive CAD    - 100% proximal ramus intermediate at site of prior stent. I think this is a CTO and she has good collaterals.     - 90% ulcerative mid RCA 2. Normal LVEDP 3. Successful PCI of the mid RCA with DES  Plan: DAPT with ASA and Brilinta for one year.   _____________     History of Present Illness     71 y.o. female with a hx of breast CA with seketal metastasis on neoadjuvent chemotherapy, pulm nodules ([name]), chronic pain, chronic leg edema, CAD (DES to ramus 2005 with late stent thrombosis 2006 tx with PTCA), super morbid obesity, known LBBB, PSVT noted on event monitor 2/14, HTN, HLD, mild AS (this adm). Cardiology notes reviewed from [name] was above. Per their notes, cath in 2006 also showed diffuse moderate LAD. 2 day nuclear stress test in 12/2016 done for left shoulder/arm pain/dyspnea showed no ischemia or infarction, EF 46%, LBBB. Last onc note 08/2017 indicated stable disease with plan for continued oral letrozole; there was recommendation to consider colonoscopy or digital rectal exam for new rectal lesions but patient preferred to follow with repeat CT in several weeks. She reported chronic dyspnea but attributed this to being extremely sedentary. She was unaware of any formal diagnosis of CHF but reports chronic edema L>R following old surgery on that leg.   Prior to the  day of admission, the patient had noticed some increased dyspnea on exertion  Uses walker.  Denied CP. She had attrib dyspnea to deconditioning. She was visiting family here in the area (lives in [place]). She did notice when leaving her sister's house and while riding electric cart in [place] she felt more SOB. When she got to her car, her dyspnea worsened and she felt urgent need to urinate. She wet herself. From  there the dyspnea escalated and she stated she "literally felt like I was about to die." She did not think she had enough time for 2 phone calls so she called her son to make him aware of what was happening and asked him to call 911. Per ED notes, EMS placed patient on monitor. Triage notes indicated this was felt to be ventricular tachycardia and 1 shock was administered with conversion to NSR. Initial HR unclear. When she arrived  to the ED she was still tachycardic with HR 150 (initial EKG sinus tach 128), RR 40, arrived non nonrebreather. She was also markedly hypertensive. She was started on NTG drip and IV Lasix with subsequent improvement in HR and BP. Initial troponin normal, then rose to peak of 1.17 with subsequent downtrend. Initial WBC 15k (no normal), K 3.8 (3.2 today), BNP 589, Mg pending. CXR hazy,? interstitial edema versus infection.  2D echo 12/25 showed EF 50-55%, grade 1 DD, mild aortic stenosis. She was started on IV Lasix BID with improvement in BPs to 130-160 range. Given symptoms she was transferred to [facility] for cardiac cath.   Hospital Course     Underwent cath noted above with 2 vessel disease noted, 100% pRamus at the site of prior stenting. This was felt to be a CTO and noted collaterals. Also 90% in the mRCA that was treated with PCI/DES x1. Breathing significantly improved post cath. Echo noted 50-55%, with G1DD. Plan for DAPT with ASA/Brilinta. No complications noted post cath. Post cath labs were stable. Worked well with cardiac rehab.   Stacey [name] was seen by Dr. Ellyn [name] and determined stable for discharge home. Follow up in the office has been arranged. Medications are listed below.   _____________  Discharge Vitals Blood pressure (!) 159/66, pulse 81, temperature (!) 97.3 F (36.3 C), temperature source Oral, resp. rate (!) 24, height 5\' 1"  (1.549 m), weight 262 lb 5.6 oz (119 kg), SpO2 95 %.  Filed Weights   11/26/17 1134 11/26/17 1149 11/27/17 0201  Weight: 259 lb 7.7 oz (117.7 kg) 258 lb 2.5 oz (117.1 kg) 262 lb 5.6 oz (119 kg)    Labs & Radiologic Studies    CBC Recent Labs    11/26/17 0525 11/27/17 0337  WBC 5.2 5.8  HGB 13.1 11.8*  HCT 41.9 37.5  MCV 97.7 95.7  PLT 214 734   Basic Metabolic Panel Recent Labs    11/25/17 0823 11/26/17 0525 11/27/17 0337  NA  --  141 139  K  --  4.0 3.7  CL  --  98* 100*  CO2  --  30 28  GLUCOSE  --  113* 120*  BUN  --  31* 25*   CREATININE  --  0.78 0.92  CALCIUM  --  9.9 9.4  MG 1.7  --   --    Liver Function Tests No results for input(s): AST, ALT, ALKPHOS, BILITOT, PROT, ALBUMIN in the last 72 hours. No results for input(s): LIPASE, AMYLASE in the last 72 hours. Cardiac Enzymes Recent Labs    11/24/17 1953 11/25/17 0216 11/25/17 0823  TROPONINI 0.73* 0.71* 0.46*   BNP Invalid input(s): POCBNP D-Dimer No results for input(s): DDIMER in the last 72 hours. Hemoglobin A1C No results for input(s): HGBA1C in the last 72 hours. Fasting Lipid Panel No results for input(s): CHOL, HDL, LDLCALC, TRIG, CHOLHDL, LDLDIRECT in the last 72 hours. Thyroid Function Tests Recent Labs    11/26/17 0525  TSH 3.230   _____________  Darletta Moll  Chest Portable 1 View  Result Date: 11/23/2017 CLINICAL DATA:  Respiratory distress. EXAM: PORTABLE CHEST 1 VIEW COMPARISON:  None. FINDINGS: Lungs are adequately inflated with hazy central bilateral airspace opacification which may be due to interstitial edema versus infection. No definite effusion. Cardiomediastinal silhouette is within normal. There is mild degenerate change of the spine. IMPRESSION: Hazy bilateral central airspace opacification which may be due to interstitial edema versus infection. Electronically Signed   By: Marin Olp M.D.   On: 11/23/2017 19:09   Disposition   Pt is being discharged home today in good condition.  Follow-up Plans & Appointments    Follow-up Information    [facility] Follow up.   Why:  Please call and make a follow up with your cardiologist within 2 weeks.  Contact information: [address] [place] [address] [phone]          Discharge Instructions    AMB Referral to Cardiac Rehabilitation - Phase II   Complete by:  As directed    Diagnosis:   Coronary Stents NSTEMI     Diet - low sodium heart healthy   Complete by:  As directed    Discharge instructions   Complete by:  As directed    Radial  Site Care Refer to this sheet in the next few weeks. These instructions provide you with information on caring for yourself after your procedure. Your caregiver may also give you more specific instructions. Your treatment has been planned according to current medical practices, but problems sometimes occur. Call your caregiver if you have any problems or questions after your procedure. HOME CARE INSTRUCTIONS You may shower the day after the procedure.Remove the bandage (dressing) and gently wash the site with plain soap and water.Gently pat the site dry.  Do not apply powder or lotion to the site.  Do not submerge the affected site in water for 3 to 5 days.  Inspect the site at least twice daily.  Do not flex or bend the affected arm for 24 hours.  No lifting over 5 pounds (2.3 kg) for 5 days after your procedure.  Do not drive home if you are discharged the same day of the procedure. Have someone else drive you.  You may drive 24 hours after the procedure unless otherwise instructed by your caregiver.  What to expect: Any bruising will usually fade within 1 to 2 weeks.  Blood that collects in the tissue (hematoma) may be painful to the touch. It should usually decrease in size and tenderness within 1 to 2 weeks.  SEEK IMMEDIATE MEDICAL CARE IF: You have unusual pain at the radial site.  You have redness, warmth, swelling, or pain at the radial site.  You have drainage (other than a small amount of blood on the dressing).  You have chills.  You have a fever or persistent symptoms for more than 72 hours.  You have a fever and your symptoms suddenly get worse.  Your arm becomes pale, cool, tingly, or numb.  You have heavy bleeding from the site. Hold pressure on the site.   PLEASE DO NOT MISS ANY DOSES OF YOUR BRILINTA!!!!! Also keep a log of you blood pressures and bring back to your follow up appt. Please call the office with any questions.   Patients taking blood thinners should  generally stay away from medicines like ibuprofen, Advil, Motrin, naproxen, and Aleve due to risk of stomach bleeding. You may take Tylenol as directed or talk to  your primary doctor about alternatives.   Increase activity slowly   Complete by:  As directed       Discharge Medications     Medication List    STOP taking these medications   aspirin EC 325 MG tablet Replaced by:  aspirin 81 MG chewable tablet   losartan 50 MG tablet Commonly known as:  COZAAR     TAKE these medications   aspirin 81 MG chewable tablet Chew 1 tablet (81 mg total) by mouth daily. Replaces:  aspirin EC 325 MG tablet   atenolol 25 MG tablet Commonly known as:  TENORMIN Take 25 mg by mouth daily.   atorvastatin 80 MG tablet Commonly known as:  LIPITOR Take 80 mg by mouth daily.   CITRACAL +D3 250-107-500 MG-MG-UNIT Chew Generic drug:  Calcium-Phosphorus-Vitamin D Chew 2 tablets by mouth 2 (two) times daily.   gabapentin 300 MG capsule Commonly known as:  NEURONTIN Take 300 mg by mouth 3 (three) times daily. Take one capsule in the morning and one capsule in the afternoon, then take 2 capsules at bedtime.   hydrochlorothiazide 25 MG tablet Commonly known as:  HYDRODIURIL Take 25 mg by mouth daily.   letrozole 2.5 MG tablet Commonly known as:  FEMARA Take 2.5 mg by mouth daily.   multivitamin with minerals Tabs tablet Take 1 tablet by mouth daily.   nitroGLYCERIN 0.4 MG SL tablet Commonly known as:  NITROSTAT Place 1 tablet (0.4 mg total) under the tongue every 5 (five) minutes as needed.   Oxycodone HCl 10 MG Tabs Take 10 mg by mouth every 6 (six) hours as needed (for pain).   Potassium 99 MG Tabs Take 1 tablet by mouth daily.   ticagrelor 90 MG Tabs tablet Commonly known as:  BRILINTA Take 1 tablet (90 mg total) by mouth 2 (two) times daily.        Aspirin prescribed at discharge?  Yes High Intensity Statin Prescribed? (Lipitor 40-80mg  or Crestor 20-40mg ): Yes Beta  Blocker Prescribed? Yes For EF <40%, was ACEI/ARB Prescribed? Yes ADP Receptor Inhibitor Prescribed? (i.e. Plavix etc.-Includes Medically Managed Patients): Yes For EF <40%, Aldosterone Inhibitor Prescribed? No: EF ok Was EF assessed during THIS hospitalization? Yes Was Cardiac Rehab II ordered? (Included Medically managed Patients): Yes   Outstanding Labs/Studies   N/a   Duration of Discharge Encounter   Greater than 30 minutes including physician time.  Signed, Reino Bellis NP-C 11/27/2017, 8:32 AM

## 2017-11-27 NOTE — Care Management Important Message (Signed)
Important Message  Patient Details  Name: Stacey [name] MRN: [id number] Date of Birth: 1946-10-14   Medicare Important Message Given:  Yes    Wetzel Meester Montine Circle 11/27/2017, 12:50 PM

## 2017-11-30 ENCOUNTER — Encounter (HOSPITAL_COMMUNITY): Payer: Self-pay | Admitting: *Deleted

## 2017-11-30 ENCOUNTER — Inpatient Hospital Stay (HOSPITAL_COMMUNITY)
Admission: EM | Admit: 2017-11-30 | Discharge: 2017-12-02 | DRG: 204 | Disposition: A | Discharge status: Home / Self Care | Payer: Medicare Other | Attending: Internal Medicine | Admitting: Internal Medicine

## 2017-11-30 ENCOUNTER — Other Ambulatory Visit: Payer: Self-pay

## 2017-11-30 ENCOUNTER — Telehealth: Payer: Self-pay | Admitting: Cardiology

## 2017-11-30 ENCOUNTER — Emergency Department (HOSPITAL_COMMUNITY): Payer: Medicare Other

## 2017-11-30 DIAGNOSIS — Z853 Personal history of malignant neoplasm of breast: Secondary | ICD-10-CM

## 2017-11-30 DIAGNOSIS — R0602 Shortness of breath: Secondary | ICD-10-CM | POA: Diagnosis not present

## 2017-11-30 DIAGNOSIS — R0600 Dyspnea, unspecified: Secondary | ICD-10-CM | POA: Diagnosis not present

## 2017-11-30 DIAGNOSIS — Z7982 Long term (current) use of aspirin: Secondary | ICD-10-CM

## 2017-11-30 DIAGNOSIS — Z87891 Personal history of nicotine dependence: Secondary | ICD-10-CM

## 2017-11-30 DIAGNOSIS — Z955 Presence of coronary angioplasty implant and graft: Secondary | ICD-10-CM

## 2017-11-30 DIAGNOSIS — I2510 Atherosclerotic heart disease of native coronary artery without angina pectoris: Secondary | ICD-10-CM | POA: Diagnosis present

## 2017-11-30 DIAGNOSIS — I214 Non-ST elevation (NSTEMI) myocardial infarction: Secondary | ICD-10-CM | POA: Diagnosis present

## 2017-11-30 DIAGNOSIS — I255 Ischemic cardiomyopathy: Secondary | ICD-10-CM | POA: Diagnosis present

## 2017-11-30 DIAGNOSIS — R778 Other specified abnormalities of plasma proteins: Secondary | ICD-10-CM

## 2017-11-30 DIAGNOSIS — E785 Hyperlipidemia, unspecified: Secondary | ICD-10-CM | POA: Diagnosis present

## 2017-11-30 DIAGNOSIS — I447 Left bundle-branch block, unspecified: Secondary | ICD-10-CM | POA: Diagnosis present

## 2017-11-30 DIAGNOSIS — C7951 Secondary malignant neoplasm of bone: Secondary | ICD-10-CM | POA: Diagnosis present

## 2017-11-30 DIAGNOSIS — Z6841 Body Mass Index (BMI) 40.0 and over, adult: Secondary | ICD-10-CM

## 2017-11-30 DIAGNOSIS — Z8249 Family history of ischemic heart disease and other diseases of the circulatory system: Secondary | ICD-10-CM

## 2017-11-30 DIAGNOSIS — I350 Nonrheumatic aortic (valve) stenosis: Secondary | ICD-10-CM | POA: Diagnosis present

## 2017-11-30 DIAGNOSIS — I5033 Acute on chronic diastolic (congestive) heart failure: Secondary | ICD-10-CM | POA: Diagnosis present

## 2017-11-30 DIAGNOSIS — I471 Supraventricular tachycardia: Secondary | ICD-10-CM | POA: Diagnosis present

## 2017-11-30 DIAGNOSIS — G8929 Other chronic pain: Secondary | ICD-10-CM | POA: Diagnosis present

## 2017-11-30 DIAGNOSIS — T45525A Adverse effect of antithrombotic drugs, initial encounter: Secondary | ICD-10-CM | POA: Diagnosis present

## 2017-11-30 DIAGNOSIS — Z7902 Long term (current) use of antithrombotics/antiplatelets: Secondary | ICD-10-CM

## 2017-11-30 DIAGNOSIS — I10 Essential (primary) hypertension: Secondary | ICD-10-CM | POA: Diagnosis present

## 2017-11-30 DIAGNOSIS — R7989 Other specified abnormal findings of blood chemistry: Secondary | ICD-10-CM

## 2017-11-30 LAB — I-STAT TROPONIN, ED: Troponin i, poc: 0.09 ng/mL (ref 0.00–0.08)

## 2017-11-30 LAB — BASIC METABOLIC PANEL
ANION GAP: 12 (ref 5–15)
BUN: 28 mg/dL — AB (ref 6–20)
CHLORIDE: 101 mmol/L (ref 101–111)
CO2: 26 mmol/L (ref 22–32)
Calcium: 10.2 mg/dL (ref 8.9–10.3)
Creatinine, Ser: 0.85 mg/dL (ref 0.44–1.00)
GFR calc Af Amer: >60 mL/min (ref >60)
GLUCOSE: 107 mg/dL — AB (ref 65–99)
POTASSIUM: 4 mmol/L (ref 3.5–5.1)
Sodium: 139 mmol/L (ref 135–145)

## 2017-11-30 LAB — CBC
HEMATOCRIT: 43.3 % (ref 36.0–46.0)
HEMOGLOBIN: 14.1 g/dL (ref 12.0–15.0)
MCH: 31.9 pg (ref 26.0–34.0)
MCHC: 32.6 g/dL (ref 30.0–36.0)
MCV: 98 fL (ref 78.0–100.0)
Platelets: 207 10*3/uL (ref 150–400)
RBC: 4.42 MIL/uL (ref 3.87–5.11)
RDW: 14.5 % (ref 11.5–15.5)
WBC: 5.8 10*3/uL (ref 4.0–10.5)

## 2017-11-30 LAB — BRAIN NATRIURETIC PEPTIDE: B Natriuretic Peptide: 56 pg/mL (ref 0.0–100.0)

## 2017-11-30 LAB — TROPONIN I: TROPONIN I: 0.07 ng/mL — AB (ref <0.03)

## 2017-11-30 LAB — POC OCCULT BLOOD, ED: FECAL OCCULT BLD: NEGATIVE

## 2017-11-30 MED ORDER — GABAPENTIN 400 MG PO CAPS
400.0000 mg | ORAL_CAPSULE | ORAL | Status: DC
  Administered 2017-12-01 – 2017-12-02 (×3): 400 mg via ORAL
  Filled 2017-11-30 (×4): qty 1

## 2017-11-30 MED ORDER — ASPIRIN 81 MG PO CHEW
81.0000 mg | CHEWABLE_TABLET | Freq: Every day | ORAL | Status: DC
  Administered 2017-12-01 – 2017-12-02 (×2): 81 mg via ORAL
  Filled 2017-11-30 (×2): qty 1

## 2017-11-30 MED ORDER — ATENOLOL 25 MG PO TABS
25.0000 mg | ORAL_TABLET | Freq: Every day | ORAL | Status: DC
  Administered 2017-12-01 – 2017-12-02 (×2): 25 mg via ORAL
  Filled 2017-11-30 (×2): qty 1

## 2017-11-30 MED ORDER — GABAPENTIN 400 MG PO CAPS
800.0000 mg | ORAL_CAPSULE | Freq: Every day | ORAL | Status: DC
  Administered 2017-11-30 – 2017-12-01 (×2): 800 mg via ORAL
  Filled 2017-11-30 (×2): qty 2

## 2017-11-30 MED ORDER — ADULT MULTIVITAMIN W/MINERALS CH
1.0000 | ORAL_TABLET | Freq: Every day | ORAL | Status: DC
  Administered 2017-12-01 – 2017-12-02 (×2): 1 via ORAL
  Filled 2017-11-30 (×2): qty 1

## 2017-11-30 MED ORDER — HYDROCHLOROTHIAZIDE 25 MG PO TABS
25.0000 mg | ORAL_TABLET | Freq: Every day | ORAL | Status: DC
Start: 2017-12-01 — End: 2017-12-02
  Administered 2017-12-01 – 2017-12-02 (×2): 25 mg via ORAL
  Filled 2017-11-30 (×2): qty 1

## 2017-11-30 MED ORDER — HEPARIN SODIUM (PORCINE) 5000 UNIT/ML IJ SOLN
5000.0000 [IU] | Freq: Three times a day (TID) | INTRAMUSCULAR | Status: DC
  Administered 2017-11-30 – 2017-12-01 (×2): 5000 [IU] via SUBCUTANEOUS
  Filled 2017-11-30 (×4): qty 1

## 2017-11-30 MED ORDER — ATORVASTATIN CALCIUM 80 MG PO TABS
80.0000 mg | ORAL_TABLET | Freq: Every day | ORAL | Status: DC
  Administered 2017-12-01 – 2017-12-02 (×2): 80 mg via ORAL
  Filled 2017-11-30 (×2): qty 1

## 2017-11-30 MED ORDER — LETROZOLE 2.5 MG PO TABS
2.5000 mg | ORAL_TABLET | Freq: Every day | ORAL | Status: DC
  Administered 2017-12-01 – 2017-12-02 (×2): 2.5 mg via ORAL
  Filled 2017-11-30 (×2): qty 1

## 2017-11-30 MED ORDER — TICAGRELOR 90 MG PO TABS
90.0000 mg | ORAL_TABLET | Freq: Two times a day (BID) | ORAL | Status: DC
  Administered 2017-11-30: 90 mg via ORAL
  Filled 2017-11-30 (×2): qty 1

## 2017-11-30 MED ORDER — ONDANSETRON HCL 4 MG/2ML IJ SOLN: 4.0000 mg | Freq: Four times a day (QID) | INTRAMUSCULAR | Status: DC | PRN

## 2017-11-30 MED ORDER — ACETAMINOPHEN 325 MG PO TABS: 650.0000 mg | ORAL_TABLET | ORAL | Status: DC | PRN

## 2017-11-30 MED ORDER — CALCIUM CARBONATE-VITAMIN D 500-200 MG-UNIT PO TABS
2.0000 | ORAL_TABLET | Freq: Two times a day (BID) | ORAL | Status: DC
  Administered 2017-11-30 – 2017-12-02 (×4): 2 via ORAL
  Filled 2017-11-30 (×4): qty 2

## 2017-11-30 MED ORDER — POTASSIUM CHLORIDE CRYS ER 10 MEQ PO TBCR
5.0000 meq | EXTENDED_RELEASE_TABLET | Freq: Every day | ORAL | Status: DC
  Administered 2017-12-01: 5 meq via ORAL

## 2017-11-30 MED ORDER — OXYCODONE HCL 5 MG PO TABS: 10.0000 mg | ORAL_TABLET | Freq: Four times a day (QID) | ORAL | Status: DC | PRN

## 2017-11-30 MED ORDER — SODIUM CHLORIDE 0.9 % IV BOLUS (SEPSIS)
500.0000 mL | Freq: Once | INTRAVENOUS | Status: AC
  Administered 2017-11-30: 500 mL via INTRAVENOUS

## 2017-11-30 MED ORDER — NITROGLYCERIN 0.4 MG SL SUBL
0.4000 mg | SUBLINGUAL_TABLET | SUBLINGUAL | Status: DC | PRN
Start: 2017-11-30 — End: 2017-12-02

## 2017-11-30 NOTE — ED Notes (Signed)
Pt refusing any IV access that is in her AC veins despite having easily obtainable access sites. Pt states "no. I don't care you will not stick me there." This RN attempted IV access in 2 places with no success. Pt refusing to be stuck again. Pt states only the IV team is allowed to attempt an IV on her. Pt verbalized understanding of delay on her care. Pt states "I don't care." Pt tearful and crying.

## 2017-11-30 NOTE — ED Triage Notes (Signed)
Pt reports being admitted last week and cardiac stent placed. Pt having sob and palpitations x 2 days. Reports it occurs at rest but more severe with exertion. Denies swelling to extremities.

## 2017-11-30 NOTE — Telephone Encounter (Signed)
New message  Patient calling with concerns of weakness, palpitations, and SOB since yesterday. Please call  Pt c/o Shortness Of Breath: STAT if SOB developed within the last 24 hours or pt is noticeably SOB on the phone   1. Are you currently SOB (can you hear that pt is SOB on the phone)? NO 2. How long have you been experiencing SOB? 2 days  3. Are you SOB when sitting or when up moving around? Sitting and worse when moving around  4. Are you currently experiencing any other symptoms? Diarrhea

## 2017-11-30 NOTE — H&P (Signed)
CARDIOLOGY HISTORY AND PHYSICAL     Date: 11/30/2017 Admitting Physician: No admitting provider for patient encounter.  Chief Complaint:  Dyspnea ____________________________________________________________________ History of Present Illness:  Stacey [name] is a 71 y.o. old female with medical history noted below presents with 2 day history of dyspnea.  She had cath performed on 11/26/17 and noted to have a 90% stenosis of the RCA with DES placed.  She was DCed home on DAPT including ASA and ticagrelor.  She presents today with complaints of progressive dyspnea over the last 2 days.  This occurs with at rest as well as with exertion.  She has not had any associated chest pain, syncope, lightheadedness or dizziness.  She has not missed any doses of her ticagrelor or ASA.  Initial workup with mildly elevated troponin of 0.09 and BNP of 56.  Vitals are stable.  ECG with sinus rhythm and LBBB, unchanged from previous.  Review of Systems:   Review of Systems:  GEN: no fever, chills, nausea, vomiting, weight change  HEENT: no vision or hearing changes  PULM: no coughing, +SOB  CV: no chest pain, palpitations, PND, orthopnea  GI: no abdominal pain  GU: no dysuria  EXT: no swelling  SKIN: no rashes  NEURO: no numbness or tingling  HEME: no bleeding or bruising  GYN: none  --12 point review systems- otherwise negative.  All other systems reviewed and are negative  Past Medical History:  Diagnosis Date  . CAD in native artery    a.DES to ramus 2005 with late stent thrombosis 2006 tx with PTCA. 12/18 PCI/DES x1 to mRCA, EF 50-55%  . CHF (congestive heart failure) ([place])   . Chronic pain   . Hyperlipidemia   . Hypertension   . LBBB (left bundle branch block)   . Left bundle branch block   . Lymphedema   . Metastatic breast cancer ([place])    a. to bone.  . MI (myocardial infarction) ([place])   . Mild aortic stenosis 10/2017  . Morbid obesity ([place])   . PSVT (paroxysmal supraventricular  tachycardia) ([place])    a. per [facility] notes, seen on event monitor in 2014.  . Pulmonary nodules     Past Surgical History:  Procedure Laterality Date  . CORONARY STENT INTERVENTION N/A 11/26/2017   Procedure: CORONARY STENT INTERVENTION;  Surgeon: Martinique, Peter M, MD;  Location: [facility];  Service: Cardiovascular;  Laterality: N/A;  . CORONARY STENT PLACEMENT    . LEFT HEART CATH AND CORONARY ANGIOGRAPHY N/A 11/26/2017   Procedure: LEFT HEART CATH AND CORONARY ANGIOGRAPHY;  Surgeon: Martinique, Peter M, MD;  Location: [place] [facility];  Service: Cardiovascular;  Laterality: N/A;    Social History   Socioeconomic History  . Marital status: Single    Spouse name: Not on file  . Number of children: Not on file  . Years of education: Not on file  . Highest education level: Not on file  Social Needs  . Financial resource strain: Not on file  . Food insecurity - worry: Not on file  . Food insecurity - inability: Not on file  . Transportation needs - medical: Not on file  . Transportation needs - non-medical: Not on file  Occupational History  . Not on file  Tobacco Use  . Smoking status: Former Research scientist (life sciences)  . Smokeless tobacco: Never Used  Substance and Sexual Activity  . Alcohol use: No    Frequency: Never  . Drug use: No  . Sexual activity:  Not on file  Other Topics Concern  . Not on file  Social History Narrative  . Not on file    Family History  Problem Relation Age of Onset  . Sudden Cardiac Death Neg Hx     Past Cardiovascular History:  +CAD +MI - No documented h/o CHF - No documented h/o PVD - No documented h/o AAA - No documented h/o valvular heart disease - No documented h/o CVA - No documented h/o Arrhythmias - No documented h/o A-fib  - No documented h/o congenital heart disease - No documented h/o CABG +PCI - No documented h/o cardiac devices (Pacer/ICD/CRT) - No documented h/o cardiac surgery       Most recent stress test:  None  Most recent  echocardiography:   11/24/84 - Left ventricle: The cavity size was normal. There was mild   concentric hypertrophy. Systolic function was normal. The   estimated ejection fraction was in the range of 50% to 55%. There   was an increased relative contribution of atrial contraction to   ventricular filling. Doppler parameters are consistent with   abnormal left ventricular relaxation (grade 1 diastolic   dysfunction). - Aortic valve: There was mild stenosis. Valve area (VTI): 1.66   cm^2. Valve area (Vmax): 1.71 cm^2. Valve area (Vmean): 1.62   cm^2. - Mitral valve: Calcified annulus. Mildly thickened leaflets . - Pulmonary arteries: PA peak pressure: 32 mm Hg (S).  Most recent left heart catheterization: 11/26/17 1. 2 vessel obstructive CAD    - 100% proximal ramus intermediate at site of prior stent. I think this is a CTO and she has good collaterals.     - 90% ulcerative mid RCA 2. Normal LVEDP 3. Successful PCI of the mid RCA with DES1. 2 vessel obstructive CAD    - 100% proximal ramus intermediate at site of prior stent. I think this is a CTO and she has good collaterals.     - 90% ulcerative mid RCA 2. Normal LVEDP 3. Successful PCI of the mid RCA with DES  CABG:  Date/ Physician: None  Device history:  None  Prior to Admission medications   Medication Sig Start Date End Date Taking? Authorizing Provider  aspirin 81 MG chewable tablet Chew 1 tablet (81 mg total) by mouth daily. 11/27/17  Yes Reino Bellis B, NP  atenolol (TENORMIN) 25 MG tablet Take 25 mg by mouth daily.   Yes [provider]  atorvastatin (LIPITOR) 80 MG tablet Take 80 mg by mouth daily.   Yes [provider]  Calcium-Phosphorus-Vitamin D (CITRACAL +D3) 250-107-500 MG-MG-UNIT CHEW Chew 2 tablets by mouth 2 (two) times daily.   Yes [provider]  gabapentin (NEURONTIN) 400 MG capsule Take 400-800 mg by mouth See admin instructions. Take 400mg  in the morning, 400mg  capsule in  the afternoon, then take 800mg  at bedtime.   Yes [provider]  hydrochlorothiazide (HYDRODIURIL) 25 MG tablet Take 25 mg by mouth daily.   Yes [provider]  letrozole (FEMARA) 2.5 MG tablet Take 2.5 mg by mouth daily.   Yes [provider]  Multiple Vitamin (MULTIVITAMIN WITH MINERALS) TABS tablet Take 1 tablet by mouth daily.   Yes [provider]  nitroGLYCERIN (NITROSTAT) 0.4 MG SL tablet Place 1 tablet (0.4 mg total) under the tongue every 5 (five) minutes as needed. 11/27/17  Yes Reino Bellis B, NP  Oxycodone HCl 10 MG TABS Take 10 mg by mouth every 6 (six) hours as needed (for pain).  Yes [provider]  Potassium 99 MG TABS Take 1 tablet by mouth daily.   Yes [provider]  ticagrelor (BRILINTA) 90 MG TABS tablet Take 1 tablet (90 mg total) by mouth 2 (two) times daily. 11/27/17  Yes Cheryln Manly, NP    No Known Allergies  Social History:   Social History   Tobacco Use  . Smoking status: Former Research scientist (life sciences)  . Smokeless tobacco: Never Used  Substance Use Topics  . Alcohol use: No    Frequency: Never  . Drug use: No    Family History  Problem Relation Age of Onset  . Sudden Cardiac Death Neg Hx     Physical Examination: Blood pressure (!) 169/68, pulse 82, temperature 97.6 F (36.4 C), temperature source Oral, resp. rate 19, height 4\' 11"  (1.499 m), weight 124.7 kg (275 lb), SpO2 100 %. General:  AAOX 4.  NAD.  NRD.  Obese HENT: Normocephalic. Atraumatic.  No acute abnom. EYES: PERRL EOMI  Neck: Supple.  No JVD.  No bruits. Cardiovascular:  Nl S1. Nl S2. No S3. No S4. Nl PMI. No m/r/c. RRR  Pulmonary/Chest: CTA B. No rales. No wheezing.  Abdomen: Soft, NT, no masses, no organomegaly. Neuro: CN intact, no motor/sensory deficit.  Ext: Warm. No edema.  SKIN- intact  No intake or output data in the 24 hours ending 11/30/17 2121  Troponin (Point of Care Test) Recent Labs    11/30/17 1715  TROPIPOC  0.09*   ____________________________________________________________________ Assessment/Plan  Dyspnea   Assessment:  Patient with dyspnea which started shortly after DC.  She denies previous history of dyspnea even in the period around her recent cath and PCI.  Troponin is mildly elevated which could be residual from her previous cath.  She currently denies chest pain.  CXR is improved compared to her previous.  BNP is low.  Likely secondary to ticagrelor which is a known side effect which usually occurs shortly after administration.     Plan  -  Admit to observation  -  Trend troponins  -  Repeat ECHO  -  Continue home medical management  Thank you for consulting cardiology.    Electronically signed by Lowella Dandy 11/30/2017 Baruch Merl, MD, PhD Cardiology

## 2017-11-30 NOTE — ED Notes (Signed)
Cardiology at the bedside.

## 2017-11-30 NOTE — ED Notes (Signed)
Dr. Leonette Monarch, [place], and [facility] first notified of elevated trop

## 2017-11-30 NOTE — Telephone Encounter (Signed)
Pt of Dr. Martinique  Returned call. Pt experiencing SOB at rest x2 days, w/ weakness, fatigue, diarrhea.  States SOB occurs "all the time, no matter what I do" (resting or walking around). She does not have means to obtain BP or HR readings at home.   Advised ED eval, to which patient voiced agreement.   Note pt just discharged from hospital 3 days ago. I have contacted cardiology cardmaster to inform.  Routed to Dr. Martinique as Juluis Rainier.

## 2017-11-30 NOTE — ED Notes (Signed)
IV team at bedside 

## 2017-11-30 NOTE — ED Provider Notes (Signed)
[facility] EMERGENCY DEPARTMENT Provider Note   CSN: [id number] Arrival date & time: 11/30/17  1601   History   Chief Complaint Chief Complaint  Patient presents with  . Palpitations  . Shortness of Breath    HPI Stacey [name] is a 71 y.o. female who presents with SOB, palpitations, N/V/D. PMH significant for CAD, hx of MI, CHF with pEF, LBBB, HLD, HTN. She was recently admitted to the hospital from 12/24-12/28 for NSTEMI and is s/p PCI to the RCA. After she was discharged home she states she felt goo for about a day. Over the weekend she started to develop intermittent SOB associated with palpitations at rest which became much worse and constant with exertion.  She states this feels similar to her symptoms which brought her in to the hospital last time. She also has developed nausea, several episodes of vomiting, and profuse watery diarrhea which was initially black but is now just yellow. Reports associated chills as well. No fever, headache, syncope, chest pain, worsening cough (has chronic dry cough), abdominal pain, urinary symptoms, leg swelling. She has been taking her medicines as prescribed. Her cardiologist is at [facility].   HPI  Past Medical History:  Diagnosis Date  . CAD in native artery    a.DES to ramus 2005 with late stent thrombosis 2006 tx with PTCA. 12/18 PCI/DES x1 to mRCA, EF 50-55%  . CHF (congestive heart failure) ([place])   . Chronic pain   . Hyperlipidemia   . Hypertension   . LBBB (left bundle branch block)   . Left bundle branch block   . Lymphedema   . Metastatic breast cancer ([place])    a. to bone.  . MI (myocardial infarction) ([place])   . Mild aortic stenosis 10/2017  . Morbid obesity ([place])   . PSVT (paroxysmal supraventricular tachycardia) ([place])    a. per [facility] notes, seen on event monitor in 2014.  . Pulmonary nodules     Patient Active Problem List   Diagnosis Date Noted  . Non-ST elevation (NSTEMI) myocardial infarction ([place])   .  Hypertension 11/23/2017  . Left bundle branch block 11/23/2017  . Acute on chronic diastolic CHF (congestive heart failure) ([place]) 11/23/2017  . CAD (coronary artery disease) 11/23/2017  . Breast cancer ([place]) 11/23/2017  . Acute respiratory failure with hypoxia ([place]) 11/23/2017    Past Surgical History:  Procedure Laterality Date  . CORONARY STENT INTERVENTION N/A 11/26/2017   Procedure: CORONARY STENT INTERVENTION;  Surgeon: Martinique, Peter M, MD;  Location: [facility];  Service: Cardiovascular;  Laterality: N/A;  . CORONARY STENT PLACEMENT    . LEFT HEART CATH AND CORONARY ANGIOGRAPHY N/A 11/26/2017   Procedure: LEFT HEART CATH AND CORONARY ANGIOGRAPHY;  Surgeon: Martinique, Peter M, MD;  Location: [facility];  Service: Cardiovascular;  Laterality: N/A;    OB History    No data available       Home Medications    Prior to Admission medications   Medication Sig Start Date End Date Taking? Authorizing Provider  aspirin 81 MG chewable tablet Chew 1 tablet (81 mg total) by mouth daily. 11/27/17   Cheryln Manly, NP  atenolol (TENORMIN) 25 MG tablet Take 25 mg by mouth daily.    [provider]  atorvastatin (LIPITOR) 80 MG tablet Take 80 mg by mouth daily.    [provider]  Calcium-Phosphorus-Vitamin D (CITRACAL +D3) [phone] MG-MG-UNIT CHEW Chew 2 tablets by mouth 2 (two) times daily.  [provider]  gabapentin (NEURONTIN) 300 MG capsule Take 300 mg by mouth 3 (three) times daily. Take one capsule in the morning and one capsule in the afternoon, then take 2 capsules at bedtime.    [provider]  hydrochlorothiazide (HYDRODIURIL) 25 MG tablet Take 25 mg by mouth daily.    [provider]  letrozole (FEMARA) 2.5 MG tablet Take 2.5 mg by mouth daily.    [provider]  Multiple Vitamin (MULTIVITAMIN WITH MINERALS) TABS tablet Take 1 tablet by mouth daily.    [provider]  nitroGLYCERIN  (NITROSTAT) 0.4 MG SL tablet Place 1 tablet (0.4 mg total) under the tongue every 5 (five) minutes as needed. 11/27/17   Cheryln Manly, NP  Oxycodone HCl 10 MG TABS Take 10 mg by mouth every 6 (six) hours as needed (for pain).    [provider]  Potassium 99 MG TABS Take 1 tablet by mouth daily.    [provider]  ticagrelor (BRILINTA) 90 MG TABS tablet Take 1 tablet (90 mg total) by mouth 2 (two) times daily. 11/27/17   Cheryln Manly, NP    Family History Family History  Problem Relation Age of Onset  . Sudden Cardiac Death Neg Hx     Social History Social History   Tobacco Use  . Smoking status: Former Research scientist (life sciences)  . Smokeless tobacco: Never Used  Substance Use Topics  . Alcohol use: No    Frequency: Never  . Drug use: No     Allergies   Patient has no known allergies.   Review of Systems Review of Systems  Constitutional: Positive for chills. Negative for fever.  Respiratory: Positive for cough (chronic) and shortness of breath. Negative for wheezing.   Cardiovascular: Positive for palpitations. Negative for chest pain and leg swelling.  Gastrointestinal: Positive for diarrhea, nausea and vomiting. Negative for abdominal pain.  Genitourinary: Negative for dysuria.  Neurological: Negative for syncope and headaches.  All other systems reviewed and are negative.    Physical Exam Updated Vital Signs BP (!) 159/83 (BP Location: Right Wrist)   Pulse 82   Temp 97.6 F (36.4 C) (Oral)   Resp (!) 22   Ht 4\' 11"  (1.499 m)   Wt 124.7 kg (275 lb)   SpO2 100%   BMI 55.54 kg/m   Physical Exam  Constitutional: She is oriented to person, place, and time. She appears well-developed and well-nourished. No distress.  HENT:  Head: Normocephalic and atraumatic.  Eyes: Conjunctivae are normal. Pupils are equal, round, and reactive to light. Right eye exhibits no discharge. Left eye exhibits no discharge. No scleral icterus.  Neck: Normal range of  motion.  Cardiovascular: Normal rate.  Pulmonary/Chest: Effort normal. No respiratory distress.  Abdominal: She exhibits no distension.  Neurological: She is alert and oriented to person, place, and time.  Skin: Skin is warm and dry.  Psychiatric: She has a normal mood and affect. Her behavior is normal.  Nursing note and vitals reviewed.    ED Treatments / Results  Labs (all labs ordered are listed, but only abnormal results are displayed) Labs Reviewed  BASIC METABOLIC PANEL - Abnormal; Notable for the following components:      Result Value   Glucose, Bld 107 (*)    BUN 28 (*)    All other components within normal limits  I-STAT TROPONIN, ED - Abnormal; Notable for the following components:   Troponin i, poc 0.09 (*)    All other  components within normal limits  CBC  BRAIN NATRIURETIC PEPTIDE  TROPONIN I    EKG  EKG Interpretation  Date/Time:  Monday November 30 2017 16:13:01 EST Ventricular Rate:  89 PR Interval:  152 QRS Duration: 142 QT Interval:  400 QTC Calculation: 486 R Axis:   5 Text Interpretation:  Normal sinus rhythm Left bundle branch block Abnormal ECG similar to Nov 27 2017 Confirmed by Sherwood Gambler [phone]) on 11/30/2017 6:30:46 PM       Radiology Dg Chest 2 View  Result Date: 11/30/2017 CLINICAL DATA:  Dyspnea x2 days. History coronary stent placement 4 days ago. No chest pain. EXAM: CHEST  2 VIEW COMPARISON:  11/23/2017 FINDINGS: The heart size and mediastinal contours are within normal limits. Clearing of interstitial edema and perihilar airspace opacities since prior. Minimal atelectasis at the left lung base. No acute osseous abnormality. IMPRESSION: 1. Interval clearing of bilateral airspace opacities and interstitial edema since prior. 2. Minimal atelectasis at the left lung base. Electronically Signed   By: Ashley Royalty M.D.   On: 11/30/2017 17:19    Procedures Procedures (including critical care time)  Medications Ordered in  ED Medications  sodium chloride 0.9 % bolus 500 mL (500 mLs Intravenous New Bag/Given 11/30/17 2113)     Initial Impression / Assessment and Plan / ED Course  I have reviewed the triage vital signs and the nursing notes.  Pertinent labs & imaging results that were available during my care of the patient were reviewed by me and considered in my medical decision making (see chart for details).  71 year old female presents with SOB, palpitations, N/V/D. She is hypertensive but otherwise vitals are normal. Exam is unremarkable. Labs are unremarkable other than minimally elevated troponin (.07). Hemoccult was negative. EKG is unchanged. CXR is improved. She was given a small amount of fluids due to her vomiting and diarrhea. Cardiology evaluated the patient and believes her symptoms may be due to [place]. They will admit for obs.  Final Clinical Impressions(s) / ED Diagnoses   Final diagnoses:  Shortness of breath  Elevated troponin    ED Discharge Orders    None       Iris Pert 11/30/17 2139    Sherwood Gambler, MD 12/01/17 [phone]

## 2017-11-30 NOTE — ED Notes (Signed)
PA at the bedside.

## 2017-11-30 NOTE — ED Notes (Signed)
Attempted report x1. 

## 2017-11-30 NOTE — ED Notes (Signed)
Cardiologist aware of troponin. Per cardiology, pt able to eat. Pt assisted to bedside commode for BM by this RN. Assisted pt to clean up after BM, barrier cream applied to moisture associated breakdown, and pt's incontinence pad changed.

## 2017-12-01 ENCOUNTER — Other Ambulatory Visit: Payer: Self-pay

## 2017-12-01 ENCOUNTER — Inpatient Hospital Stay (HOSPITAL_COMMUNITY): Payer: Medicare Other

## 2017-12-01 DIAGNOSIS — T45525A Adverse effect of antithrombotic drugs, initial encounter: Secondary | ICD-10-CM | POA: Diagnosis not present

## 2017-12-01 DIAGNOSIS — Z8249 Family history of ischemic heart disease and other diseases of the circulatory system: Secondary | ICD-10-CM | POA: Diagnosis not present

## 2017-12-01 DIAGNOSIS — I447 Left bundle-branch block, unspecified: Secondary | ICD-10-CM | POA: Diagnosis not present

## 2017-12-01 DIAGNOSIS — R0602 Shortness of breath: Secondary | ICD-10-CM | POA: Diagnosis not present

## 2017-12-01 DIAGNOSIS — I214 Non-ST elevation (NSTEMI) myocardial infarction: Secondary | ICD-10-CM | POA: Diagnosis not present

## 2017-12-01 DIAGNOSIS — C7951 Secondary malignant neoplasm of bone: Secondary | ICD-10-CM | POA: Diagnosis not present

## 2017-12-01 DIAGNOSIS — I340 Nonrheumatic mitral (valve) insufficiency: Secondary | ICD-10-CM | POA: Diagnosis not present

## 2017-12-01 DIAGNOSIS — I350 Nonrheumatic aortic (valve) stenosis: Secondary | ICD-10-CM

## 2017-12-01 DIAGNOSIS — R0600 Dyspnea, unspecified: Secondary | ICD-10-CM | POA: Diagnosis not present

## 2017-12-01 DIAGNOSIS — Z87891 Personal history of nicotine dependence: Secondary | ICD-10-CM | POA: Diagnosis not present

## 2017-12-01 DIAGNOSIS — G8929 Other chronic pain: Secondary | ICD-10-CM | POA: Diagnosis present

## 2017-12-01 DIAGNOSIS — Z7982 Long term (current) use of aspirin: Secondary | ICD-10-CM | POA: Diagnosis not present

## 2017-12-01 DIAGNOSIS — Z853 Personal history of malignant neoplasm of breast: Secondary | ICD-10-CM | POA: Diagnosis not present

## 2017-12-01 DIAGNOSIS — Z6841 Body Mass Index (BMI) 40.0 and over, adult: Secondary | ICD-10-CM | POA: Diagnosis not present

## 2017-12-01 DIAGNOSIS — Z7902 Long term (current) use of antithrombotics/antiplatelets: Secondary | ICD-10-CM | POA: Diagnosis not present

## 2017-12-01 DIAGNOSIS — I255 Ischemic cardiomyopathy: Secondary | ICD-10-CM | POA: Diagnosis present

## 2017-12-01 DIAGNOSIS — E785 Hyperlipidemia, unspecified: Secondary | ICD-10-CM | POA: Diagnosis not present

## 2017-12-01 DIAGNOSIS — I471 Supraventricular tachycardia: Secondary | ICD-10-CM | POA: Diagnosis not present

## 2017-12-01 DIAGNOSIS — I2510 Atherosclerotic heart disease of native coronary artery without angina pectoris: Secondary | ICD-10-CM | POA: Diagnosis not present

## 2017-12-01 DIAGNOSIS — Z955 Presence of coronary angioplasty implant and graft: Secondary | ICD-10-CM | POA: Diagnosis not present

## 2017-12-01 DIAGNOSIS — I10 Essential (primary) hypertension: Secondary | ICD-10-CM | POA: Diagnosis not present

## 2017-12-01 LAB — CBC
HCT: 38.7 % (ref 36.0–46.0)
HEMATOCRIT: 37.5 % (ref 36.0–46.0)
HEMOGLOBIN: 11.9 g/dL — AB (ref 12.0–15.0)
Hemoglobin: 12.2 g/dL (ref 12.0–15.0)
MCH: 30.3 pg (ref 26.0–34.0)
MCH: 30.9 pg (ref 26.0–34.0)
MCHC: 31.5 g/dL (ref 30.0–36.0)
MCHC: 31.7 g/dL (ref 30.0–36.0)
MCV: 96 fL (ref 78.0–100.0)
MCV: 97.4 fL (ref 78.0–100.0)
PLATELETS: 225 10*3/uL (ref 150–400)
Platelets: 214 10*3/uL (ref 150–400)
RBC: 4.03 MIL/uL (ref 3.87–5.11)
RBC: 3.85 MIL/uL — ABNORMAL LOW (ref 3.87–5.11)
RDW: 14.5 % (ref 11.5–15.5)
RDW: 14.7 % (ref 11.5–15.5)
WBC: 7.4 10*3/uL (ref 4.0–10.5)
WBC: 6.7 10*3/uL (ref 4.0–10.5)

## 2017-12-01 LAB — BASIC METABOLIC PANEL
Anion gap: 13 (ref 5–15)
BUN: 22 mg/dL — AB (ref 6–20)
CHLORIDE: 99 mmol/L — AB (ref 101–111)
CO2: 29 mmol/L (ref 22–32)
Calcium: 9.8 mg/dL (ref 8.9–10.3)
Creatinine, Ser: 0.8 mg/dL (ref 0.44–1.00)
GFR calc Af Amer: >60 mL/min (ref >60)
GFR calc non Af Amer: >60 mL/min (ref >60)
GLUCOSE: 104 mg/dL — AB (ref 65–99)
POTASSIUM: 3.8 mmol/L (ref 3.5–5.1)
Sodium: 141 mmol/L (ref 135–145)

## 2017-12-01 LAB — C DIFFICILE QUICK SCREEN W PCR REFLEX
C DIFFICILE (CDIFF) INTERP: NOT DETECTED
C DIFFICLE (CDIFF) ANTIGEN: NEGATIVE
C Diff toxin: NEGATIVE

## 2017-12-01 LAB — BRAIN NATRIURETIC PEPTIDE: B NATRIURETIC PEPTIDE 5: 50.1 pg/mL (ref 0.0–100.0)

## 2017-12-01 LAB — TROPONIN I
TROPONIN I: 0.07 ng/mL — AB (ref <0.03)
Troponin I: 0.05 ng/mL (ref <0.03)
Troponin I: 0.04 ng/mL (ref <0.03)

## 2017-12-01 LAB — CREATININE, SERUM: CREATININE: 0.74 mg/dL (ref 0.44–1.00)

## 2017-12-01 LAB — ECHOCARDIOGRAM COMPLETE
Height: 59 in
Weight: 4097.6 oz

## 2017-12-01 MED ORDER — LOSARTAN POTASSIUM 50 MG PO TABS
50.0000 mg | ORAL_TABLET | Freq: Every day | ORAL | Status: DC
  Administered 2017-12-01 – 2017-12-02 (×2): 50 mg via ORAL
  Filled 2017-12-01 (×2): qty 1

## 2017-12-01 MED ORDER — POTASSIUM CHLORIDE CRYS ER 10 MEQ PO TBCR
EXTENDED_RELEASE_TABLET | ORAL | Status: AC
  Filled 2017-12-01: qty 1

## 2017-12-01 MED ORDER — LOPERAMIDE HCL 2 MG PO CAPS: 2.0000 mg | ORAL_CAPSULE | ORAL | Status: DC | PRN

## 2017-12-01 MED ORDER — CLOPIDOGREL BISULFATE 75 MG PO TABS
300.0000 mg | ORAL_TABLET | Freq: Once | ORAL | Status: AC
  Administered 2017-12-01: 300 mg via ORAL
  Filled 2017-12-01: qty 4

## 2017-12-01 MED ORDER — CLOPIDOGREL BISULFATE 75 MG PO TABS
75.0000 mg | ORAL_TABLET | Freq: Every day | ORAL | Status: DC
  Administered 2017-12-02: 75 mg via ORAL
  Filled 2017-12-01: qty 1

## 2017-12-01 NOTE — Progress Notes (Signed)
Received pt to 4E25 from ED. CHG bath done, VSS, tele box [id number] applied, verified x2. Pt denies chest pain or SOB. Instructed to be NPO after midnight. Updated on plan of care. Call bell within reach. Will continue to monitor.  Jaymes Graff, RN

## 2017-12-01 NOTE — Progress Notes (Signed)
  Echocardiogram 2D Echocardiogram has been performed.  Siyon Linck T Lawerance Matsuo 12/01/2017, 1:19 PM

## 2017-12-01 NOTE — Progress Notes (Signed)
Subjective:  Admitted with worsening dyspnea and diarrhea.  Had some mild dyspnea in the room.  No chest pain.  BNP is low.  Objective:  Vital Signs in the last 24 hours: BP (!) 163/74 (BP Location: Right Wrist)   Pulse 83   Temp 97.9 F (36.6 C) (Oral)   Resp 16   Ht 4\' 11"  (1.499 m)   Wt 116.2 kg (256 lb 1.6 oz)   SpO2 97%   BMI 51.73 kg/m   Physical Exam: Talkative obese elderly female in no acute distress Lungs:  Clear Cardiac:  Regular rhythm, normal S1 and S2, no S3 Extremities:  No edema present  Intake/Output from previous day: No intake/output data recorded.  Weight Filed Weights   11/30/17 1618 11/30/17 2333  Weight: 124.7 kg (275 lb) 116.2 kg (256 lb 1.6 oz)    Lab Results: Basic Metabolic Panel: Recent Labs    11/30/17 1641 11/30/17 2325 12/01/17 0446  NA 139  --  141  K 4.0  --  3.8  CL 101  --  99*  CO2 26  --  29  GLUCOSE 107*  --  104*  BUN 28*  --  22*  CREATININE 0.85 0.74 0.80   CBC: Recent Labs    11/30/17 2325 12/01/17 0446  WBC 7.4 6.7  HGB 12.2 11.9*  HCT 38.7 37.5  MCV 96.0 97.4  PLT 225 214   Cardiac Enzymes: Troponin (Point of Care Test) Recent Labs    11/30/17 1715  TROPIPOC 0.09*   Cardiac Panel (last 3 results) Recent Labs    11/30/17 1958 11/30/17 2325 12/01/17 0446  TROPONINI 0.07* 0.07* 0.05*    Telemetry: Reviewed   Assessment/Plan:  1.  Progressive dyspnea occurring in the few days after starting Brilinta could be a side effect of this 2.  Diarrhea of uncertain etiology-evidently was black at some point and hemoglobin is mildly down today. 3.  Recent stent (DES) of the RCA  Recommendations:  She is having both diarrhea as well as worsening dyspnea.  Will load with 300 mg of Plavix and discontinue Brilinta.  Still having some dyspnea even when I was in the room.  She is also having significant diarrhea.  Awaiting results of that.  I think we should keep her overnight and if dyspnea improved by morning  perhaps discharge tomorrow if diarrhea okay.  Recheck CBC in the morning.     Kerry Hough  MD [facility] Card[facility]  12/01/2017, 10:16 AM

## 2017-12-02 DIAGNOSIS — R0602 Shortness of breath: Secondary | ICD-10-CM

## 2017-12-02 LAB — CBC WITH DIFFERENTIAL/PLATELET
BASOS ABS: 0 10*3/uL (ref 0.0–0.1)
BASOS PCT: 0 %
EOS ABS: 0.4 10*3/uL (ref 0.0–0.7)
EOS PCT: 7 %
HCT: 35.5 % — ABNORMAL LOW (ref 36.0–46.0)
Hemoglobin: 11.3 g/dL — ABNORMAL LOW (ref 12.0–15.0)
Lymphocytes Relative: 26 %
Lymphs Abs: 1.3 10*3/uL (ref 0.7–4.0)
MCH: 30.9 pg (ref 26.0–34.0)
MCHC: 31.8 g/dL (ref 30.0–36.0)
MCV: 97 fL (ref 78.0–100.0)
Monocytes Absolute: 0.3 10*3/uL (ref 0.1–1.0)
Monocytes Relative: 6 %
Neutro Abs: 3.2 10*3/uL (ref 1.7–7.7)
Neutrophils Relative %: 61 %
PLATELETS: 206 10*3/uL (ref 150–400)
RBC: 3.66 MIL/uL — AB (ref 3.87–5.11)
RDW: 14.4 % (ref 11.5–15.5)
WBC: 5.2 10*3/uL (ref 4.0–10.5)

## 2017-12-02 MED ORDER — CLOPIDOGREL BISULFATE 75 MG PO TABS: 75.0000 mg | ORAL_TABLET | Freq: Every day | ORAL | 4 refills | Status: DC

## 2017-12-02 MED ORDER — LOSARTAN POTASSIUM 50 MG PO TABS: 50.0000 mg | ORAL_TABLET | Freq: Every day | ORAL | 4 refills | Status: DC

## 2017-12-02 NOTE — Progress Notes (Signed)
Progress Note  Patient Name: Stacey [name] Date of Encounter: 12/02/2017  Primary Cardiologist: Chryl Heck at [facility]. No chest pain or dyspnea.   Inpatient Medications    Scheduled Meds: . aspirin  81 mg Oral Daily  . atenolol  25 mg Oral Daily  . atorvastatin  80 mg Oral Daily  . calcium-vitamin D  2 tablet Oral BID  . clopidogrel  75 mg Oral Daily  . gabapentin  400 mg Oral 2 times per day  . gabapentin  800 mg Oral QHS  . heparin  5,000 Units Subcutaneous Q8H  . hydrochlorothiazide  25 mg Oral Daily  . letrozole  2.5 mg Oral Daily  . losartan  50 mg Oral Daily  . multivitamin with minerals  1 tablet Oral Daily  . potassium chloride  5 mEq Oral Daily   Continuous Infusions:  PRN Meds: acetaminophen, loperamide, nitroGLYCERIN, ondansetron (ZOFRAN) IV, oxyCODONE   Vital Signs    Vitals:   12/01/17 1937 12/01/17 2315 12/02/17 0341 12/02/17 0842  BP: (!) 138/98 (!) 158/76 (!) 155/68 (!) 151/81  Pulse: 80 85 77 87  Resp: (!) 27 19 20    Temp: 97.9 F (36.6 C) 97.8 F (36.6 C) 97.6 F (36.4 C)   TempSrc: Oral Oral Oral   SpO2: 91% 96% 100%   Weight:      Height:        Intake/Output Summary (Last 24 hours) at 12/02/2017 1017 Last data filed at 12/02/2017 0830 Gross per 24 hour  Intake 560 ml  Output -  Net 560 ml   Filed Weights   11/30/17 1618 11/30/17 2333  Weight: 275 lb (124.7 kg) 256 lb 1.6 oz (116.2 kg)    Telemetry    Sinus - Personally Reviewed  ECG    NSR, LBBB - Personally Reviewed  Physical Exam   GEN: No acute distress.   Neck: No JVD Cardiac: RRR, soft systolic murmur noted.   Respiratory: Clear to auscultation bilaterally. GI: Soft, nontender, non-distended  MS: No edema; No deformity. Neuro:  Nonfocal  Psych: Normal affect   Labs    Chemistry Recent Labs  Lab 11/27/17 0337 11/30/17 1641 11/30/17 2325 12/01/17 0446  NA 139 139  --  141  K 3.7 4.0  --  3.8  CL 100* 101  --  99*  CO2 28 26  --  29   GLUCOSE 120* 107*  --  104*  BUN 25* 28*  --  22*  CREATININE 0.92 0.85 0.74 0.80  CALCIUM 9.4 10.2  --  9.8  GFRNONAA >60 >60 >60 >60  GFRAA >60 >60 >60 >60  ANIONGAP 11 12  --  13     Hematology Recent Labs  Lab 11/30/17 2325 12/01/17 0446 12/02/17 0313  WBC 7.4 6.7 5.2  RBC 4.03 3.85* 3.66*  HGB 12.2 11.9* 11.3*  HCT 38.7 37.5 35.5*  MCV 96.0 97.4 97.0  MCH 30.3 30.9 30.9  MCHC 31.5 31.7 31.8  RDW 14.5 14.7 14.4  PLT 225 214 206    Cardiac Enzymes Recent Labs  Lab 11/30/17 1958 11/30/17 2325 12/01/17 0446 12/01/17 1107  TROPONINI 0.07* 0.07* 0.05* 0.04*    Recent Labs  Lab 11/30/17 1715  TROPIPOC 0.09*     BNP Recent Labs  Lab 11/30/17 1958 11/30/17 2325  BNP 56.0 50.1     DDimer No results for input(s): DDIMER in the last 168 hours.   Radiology    Dg Chest 2 View  Result Date: 11/30/2017 CLINICAL DATA:  Dyspnea x2 days. History coronary stent placement 4 days ago. No chest pain. EXAM: CHEST  2 VIEW COMPARISON:  11/23/2017 FINDINGS: The heart size and mediastinal contours are within normal limits. Clearing of interstitial edema and perihilar airspace opacities since prior. Minimal atelectasis at the left lung base. No acute osseous abnormality. IMPRESSION: 1. Interval clearing of bilateral airspace opacities and interstitial edema since prior. 2. Minimal atelectasis at the left lung base. Electronically Signed   By: Ashley Royalty M.D.   On: 11/30/2017 17:19    Cardiac Studies   Echo 12/01/17 Left ventricle: The cavity size was normal. There was mild   concentric hypertrophy. Systolic function was moderately to   severely reduced. The estimated ejection fraction was in the   range of 30% to 35%. Diffuse hypokinesis. Doppler parameters are   consistent with abnormal left ventricular relaxation (grade 1   diastolic dysfunction). - Aortic valve: Probably trileaflet; moderately thickened, severely   calcified leaflets. There was mild stenosis. Peak  gradient (S):   13 mm Hg. Valve area (Vmax): 1.26 cm^2. - Mitral valve: Calcified annulus. Mildly thickened leaflets .   There was mild regurgitation. - Right ventricle: The cavity size was normal. Wall thickness was   normal. Systolic function was normal. - Right atrium: The atrium was normal in size. - Pulmonary arteries: Systolic pressure could not be accurately   estimated. - Inferior vena cava: The vessel was normal in size. - Pericardium, extracardiac: There was no pericardial effusion.  Patient Profile     Assessment and Plan:   72 y.o. female with history of CAD with recent PCI, DES placement in the mid RCA and noted to have chronic occlusion of intermediate branch (that filled from collaterals). She is admitted with dyspnea and this is felt to be due to [place]. She has been changed to Plavix and is feeling better. Her mildly elevated troponin was likely still the downward trend from her NSTEMI last week. Continues to trend down to 0.04. Echo yesterday with mild reduction LV systolic function compared to echo one week ago during her hospitalization for NSTEMI. I have reviewed the echo report from [place] in January 2018 and it seems that her LV function was down slightly then too, no EF reported. Will continue management of CAD with ASA/Plavix/beta blocker/statin. Will continue management of ischemic cardiomyopathy with beta blocker and Losartan. Hopefully EF will improve some on good medical therapy and following PCI.   Discharge home today. Follow up in one week with her cardiologist Dr. Chryl Heck at [facility].    For questions or updates, please contact [place] Please consult [email or web address] for contact info under Cardiology/STEMI.      Signed, Lauree Chandler, MD  12/02/2017, 10:17 AM

## 2017-12-02 NOTE — Discharge Summary (Signed)
Discharge Summary    Patient ID: Stacey [name],  MRN: [id number], DOB/AGE: 03-Jan-1946 72 y.o.  Admit date: 11/30/2017 Discharge date: 12/02/2017   Primary Care Provider: Bernette Redbird Primary Cardiologist: Dr. Chryl Heck [facility])  Discharge Diagnoses    Principal Problem:   Dyspnea Active Problems:   Hypertension   Left bundle branch block   Acute on chronic diastolic CHF (congestive heart failure) (HCC)   CAD (coronary artery disease)   Non-ST elevation (NSTEMI) myocardial infarction ([place])   Allergies No Known Allergies   History of Present Illness     Stacey [name] is a 72 y.o. old female with medical history of breast CA with seketal metastasis on neoadjuvent chemotherapy, pulm nodules (followed by [facility]), chronic pain,chronic leg edema, CAD (DES to ramus 2005 with late stent thrombosis 2006 tx with PTCA), super morbid obesity, known LBBB, PSVT noted on event monitor 2/14, HTN, HLD, mild AS (this adm) presents with 2 day history of dyspnea.  She had cath performed on 11/26/17 and noted to have a 90% stenosis of the RCA with DES placed.  She was D/C'ed home on DAPT including ASA and ticagrelor. She presents today with complaints of progressive dyspnea over the last 2 days. This occurs with at rest as well as with exertion.  She has not had any associated chest pain, syncope, lightheadedness or dizziness.  She has not missed any doses of her ticagrelor or ASA. Initial workup with mildly elevated troponin of 0.09 and BNP of 56.  Vitals are stable.  ECG with sinus rhythm and LBBB, unchanged from previous.   Hospital Course     Consultants: none  She was admitted with dyspnea and this was felt to be due to [place]. She has been changed to Plavix and is feeling better. Her mildly elevated troponin was likely still the downward trend from her NSTEMI last week. Continues to trend down to 0.04. Echo yesterday with mild reduction in LV systolic function compared to  echo one week ago during her hospitalization for NSTEMI. I have reviewed the echo report from [place] in January 2018 and it seems that her LV function was down slightly then too, no EF reported. Will continue management of CAD with ASA/Plavix/beta blocker/statin. Will continue management of ischemic cardiomyopathy with beta blocker and Losartan. Hopefully EF will improve some on good medical therapy and following PCI.   Discharge home today. Follow up in one week with her cardiologist Dr. Chryl Heck at [facility].  Patient seen and examined by Dr. Angelena Form today and was stable for discharge. All follow up has been arranged.  _____________  Discharge Vitals Blood pressure (!) 151/81, pulse 87, temperature 97.6 F (36.4 C), temperature source Oral, resp. rate 20, height 4\' 11"  (1.499 m), weight 256 lb 1.6 oz (116.2 kg), SpO2 100 %.  Filed Weights   11/30/17 1618 11/30/17 2333  Weight: 275 lb (124.7 kg) 256 lb 1.6 oz (116.2 kg)    Labs & Radiologic Studies    CBC Recent Labs    12/01/17 0446 12/02/17 0313  WBC 6.7 5.2  NEUTROABS  --  3.2  HGB 11.9* 11.3*  HCT 37.5 35.5*  MCV 97.4 97.0  PLT 214 811   Basic Metabolic Panel Recent Labs    11/30/17 1641 11/30/17 2325 12/01/17 0446  NA 139  --  141  K 4.0  --  3.8  CL 101  --  99*  CO2 26  --  29  GLUCOSE 107*  --  104*  BUN 28*  --  22*  CREATININE 0.85 0.74 0.80  CALCIUM 10.2  --  9.8   Liver Function Tests No results for input(s): AST, ALT, ALKPHOS, BILITOT, PROT, ALBUMIN in the last 72 hours. No results for input(s): LIPASE, AMYLASE in the last 72 hours. Cardiac Enzymes Recent Labs    11/30/17 2325 12/01/17 0446 12/01/17 1107  TROPONINI 0.07* 0.05* 0.04*   BNP Invalid input(s): POCBNP D-Dimer No results for input(s): DDIMER in the last 72 hours. Hemoglobin A1C No results for input(s): HGBA1C in the last 72 hours. Fasting Lipid Panel No results for input(s): CHOL, HDL, LDLCALC, TRIG, CHOLHDL, LDLDIRECT in the last 72  hours. Thyroid Function Tests No results for input(s): TSH, T4TOTAL, T3FREE, THYROIDAB in the last 72 hours.  Invalid input(s): FREET3 _____________  Dg Chest 2 View  Result Date: 11/30/2017 CLINICAL DATA:  Dyspnea x2 days. History coronary stent placement 4 days ago. No chest pain. EXAM: CHEST  2 VIEW COMPARISON:  11/23/2017 FINDINGS: The heart size and mediastinal contours are within normal limits. Clearing of interstitial edema and perihilar airspace opacities since prior. Minimal atelectasis at the left lung base. No acute osseous abnormality. IMPRESSION: 1. Interval clearing of bilateral airspace opacities and interstitial edema since prior. 2. Minimal atelectasis at the left lung base. Electronically Signed   By: Ashley Royalty M.D.   On: 11/30/2017 17:19   Dg Chest Portable 1 View  Result Date: 11/23/2017 CLINICAL DATA:  Respiratory distress. EXAM: PORTABLE CHEST 1 VIEW COMPARISON:  None. FINDINGS: Lungs are adequately inflated with hazy central bilateral airspace opacification which may be due to interstitial edema versus infection. No definite effusion. Cardiomediastinal silhouette is within normal. There is mild degenerate change of the spine. IMPRESSION: Hazy bilateral central airspace opacification which may be due to interstitial edema versus infection. Electronically Signed   By: Marin Olp M.D.   On: 11/23/2017 19:09     Diagnostic Studies/Procedures     Echo 12/01/17 Left ventricle: The cavity size was normal. There was mild concentric hypertrophy. Systolic function was moderately to severely reduced. The estimated ejection fraction was in the range of 30% to 35%. Diffuse hypokinesis. Doppler parameters are consistent with abnormal left ventricular relaxation (grade 1 diastolic dysfunction). - Aortic valve: Probably trileaflet; moderately thickened, severely calcified leaflets. There was mild stenosis. Peak gradient (S): 13 mm Hg. Valve area (Vmax): 1.26  cm^2. - Mitral valve: Calcified annulus. Mildly thickened leaflets . There was mild regurgitation. - Right ventricle: The cavity size was normal. Wall thickness was normal. Systolic function was normal. - Right atrium: The atrium was normal in size. - Pulmonary arteries: Systolic pressure could not be accurately estimated. - Inferior vena cava: The vessel was normal in size. - Pericardium, extracardiac: There was no pericardial effusion.   Echocardiogram 11/24/2017 Left ventricle: The cavity size was normal. There was mild concentric hypertrophy. Systolic function was normal. The estimated ejection fraction was in the range of 50% to 55%. There was an increased relative contribution of atrial contraction to ventricular filling. Doppler parameters are consistent with abnormal left ventricular relaxation (grade 1 diastolic dysfunction). - Aortic valve: There was mild stenosis. Valve area (VTI): 1.66 cm^2. Valve area (Vmax): 1.71 cm^2. Valve area (Vmean): 1.62 cm^2. - Mitral valve: Calcified annulus. Mildly thickened leaflets . - Pulmonary arteries: PA peak pressure: 32 mm Hg (S).   Disposition   Pt is being discharged home today in good condition.  Follow-up Plans & Appointments   Follow up with [facility]  cardiologist  Discharge Instructions    Diet - low sodium heart healthy   Complete by:  As directed    Increase activity slowly   Complete by:  As directed       Discharge Medications   Allergies as of 12/02/2017   No Known Allergies     Medication List    STOP taking these medications   ticagrelor 90 MG Tabs tablet Commonly known as:  BRILINTA     TAKE these medications   aspirin 81 MG chewable tablet Chew 1 tablet (81 mg total) by mouth daily.   atenolol 25 MG tablet Commonly known as:  TENORMIN Take 25 mg by mouth daily.   atorvastatin 80 MG tablet Commonly known as:  LIPITOR Take 80 mg by mouth daily.   CITRACAL +D3 250-107-500  MG-MG-UNIT Chew Generic drug:  Calcium-Phosphorus-Vitamin D Chew 2 tablets by mouth 2 (two) times daily.   clopidogrel 75 MG tablet Commonly known as:  PLAVIX Take 1 tablet (75 mg total) by mouth daily. Start taking on:  12/03/2017   gabapentin 400 MG capsule Commonly known as:  NEURONTIN Take 400-800 mg by mouth See admin instructions. Take 400mg  in the morning, 400mg  capsule in the afternoon, then take 800mg  at bedtime.   hydrochlorothiazide 25 MG tablet Commonly known as:  HYDRODIURIL Take 25 mg by mouth daily.   letrozole 2.5 MG tablet Commonly known as:  FEMARA Take 2.5 mg by mouth daily.   losartan 50 MG tablet Commonly known as:  COZAAR Take 1 tablet (50 mg total) by mouth daily. Start taking on:  12/03/2017   multivitamin with minerals Tabs tablet Take 1 tablet by mouth daily.   nitroGLYCERIN 0.4 MG SL tablet Commonly known as:  NITROSTAT Place 1 tablet (0.4 mg total) under the tongue every 5 (five) minutes as needed.   Oxycodone HCl 10 MG Tabs Take 10 mg by mouth every 6 (six) hours as needed (for pain).   Potassium 99 MG Tabs Take 1 tablet by mouth daily.        Outstanding Labs/Studies   F/U OP with Laelyn Blumenthal cardiologist  Duration of Discharge Encounter   Greater than 30 minutes including physician time.  Signed, Tami Lin Alize Borrayo PA-C 12/02/2017, 12:08 PM

## 2018-03-16 ENCOUNTER — Encounter (HOSPITAL_COMMUNITY): Payer: Medicare Other

## 2018-04-19 ENCOUNTER — Encounter (HOSPITAL_COMMUNITY)
Admission: RE | Admit: 2018-04-19 | Discharge: 2018-04-19 | Disposition: A | Discharge status: Home / Self Care | Payer: Medicare Other | Source: Ambulatory Visit | Attending: Cardiology | Admitting: Cardiology

## 2018-04-19 ENCOUNTER — Encounter (HOSPITAL_COMMUNITY): Payer: Self-pay

## 2018-04-19 VITALS — BP 140/98 | HR 79 | Ht 60.0 in | Wt 255.4 lb

## 2018-04-19 DIAGNOSIS — I509 Heart failure, unspecified: Secondary | ICD-10-CM | POA: Insufficient documentation

## 2018-04-19 DIAGNOSIS — E785 Hyperlipidemia, unspecified: Secondary | ICD-10-CM | POA: Insufficient documentation

## 2018-04-19 DIAGNOSIS — Z79899 Other long term (current) drug therapy: Secondary | ICD-10-CM | POA: Insufficient documentation

## 2018-04-19 DIAGNOSIS — Z7982 Long term (current) use of aspirin: Secondary | ICD-10-CM | POA: Insufficient documentation

## 2018-04-19 DIAGNOSIS — I2510 Atherosclerotic heart disease of native coronary artery without angina pectoris: Secondary | ICD-10-CM | POA: Insufficient documentation

## 2018-04-19 DIAGNOSIS — Z87891 Personal history of nicotine dependence: Secondary | ICD-10-CM | POA: Insufficient documentation

## 2018-04-19 DIAGNOSIS — Z7902 Long term (current) use of antithrombotics/antiplatelets: Secondary | ICD-10-CM | POA: Insufficient documentation

## 2018-04-19 DIAGNOSIS — I214 Non-ST elevation (NSTEMI) myocardial infarction: Secondary | ICD-10-CM | POA: Insufficient documentation

## 2018-04-19 DIAGNOSIS — I447 Left bundle-branch block, unspecified: Secondary | ICD-10-CM | POA: Insufficient documentation

## 2018-04-19 DIAGNOSIS — I110 Hypertensive heart disease with heart failure: Secondary | ICD-10-CM | POA: Insufficient documentation

## 2018-04-19 DIAGNOSIS — I252 Old myocardial infarction: Secondary | ICD-10-CM | POA: Insufficient documentation

## 2018-04-19 DIAGNOSIS — Z955 Presence of coronary angioplasty implant and graft: Secondary | ICD-10-CM | POA: Insufficient documentation

## 2018-04-19 NOTE — Progress Notes (Signed)
Cardiac Individual Treatment Plan  Patient Details  Name: Stacey [name] MRN: [id number] Date of Birth: [birth year] Referring Provider:     [facility] from 04/19/2018 in [place]  Referring Provider  Dr. Chryl Heck      Initial Encounter Date:    CARDIAC REHAB PHASE II ORIENTATION from 04/19/2018 in [facility] [place]  Date  04/19/18  Referring Provider  Dr. Chryl Heck      Visit Diagnosis: NSTEMI (non-ST elevated myocardial infarction) [facility])  Status post coronary artery stent placement  Patient's Home Medications on Admission:  Current Outpatient Medications:  .  b complex vitamins tablet, Take 1 tablet by mouth daily., Disp: , Rfl:  .  aspirin 81 MG chewable tablet, Chew 1 tablet (81 mg total) by mouth daily., Disp: , Rfl:  .  atenolol (TENORMIN) 25 MG tablet, Take 25 mg by mouth daily., Disp: , Rfl:  .  atorvastatin (LIPITOR) 80 MG tablet, Take 80 mg by mouth daily., Disp: , Rfl:  .  Calcium-Phosphorus-Vitamin D (CITRACAL +D3) [phone] MG-MG-UNIT CHEW, Chew 2 tablets by mouth 2 (two) times daily., Disp: , Rfl:  .  clopidogrel (PLAVIX) 75 MG tablet, Take 1 tablet (75 mg total) by mouth daily., Disp: 90 tablet, Rfl: 4 .  gabapentin (NEURONTIN) 400 MG capsule, Take 400-800 mg by mouth See admin instructions. Take 400mg  in the morning, 400mg  capsule in the afternoon, then take 800mg  at bedtime., Disp: , Rfl:  .  hydrochlorothiazide (HYDRODIURIL) 25 MG tablet, Take 25 mg by mouth daily., Disp: , Rfl:  .  letrozole (FEMARA) 2.5 MG tablet, Take 2.5 mg by mouth daily., Disp: , Rfl:  .  losartan (COZAAR) 50 MG tablet, Take 1 tablet (50 mg total) by mouth daily., Disp: 90 tablet, Rfl: 4 .  Multiple Vitamin (MULTIVITAMIN WITH MINERALS) TABS tablet, Take 1 tablet by mouth daily., Disp: , Rfl:  .  nitroGLYCERIN (NITROSTAT) 0.4 MG SL tablet, Place 1 tablet (0.4 mg total) under the tongue every 5 (five) minutes as needed., Disp: 25  tablet, Rfl: 0 .  Oxycodone HCl 10 MG TABS, Take 10 mg by mouth every 6 (six) hours as needed (for pain)., Disp: , Rfl:  .  Potassium 99 MG TABS, Take 1 tablet by mouth daily., Disp: , Rfl:   Past Medical History: Past Medical History:  Diagnosis Date  . CAD in native artery    a.DES to ramus 2005 with late stent thrombosis 2006 tx with PTCA. 12/18 PCI/DES x1 to mRCA, EF 50-55%  . CHF (congestive heart failure) ([place])   . Chronic pain   . Hyperlipidemia   . Hypertension   . LBBB (left bundle branch block)   . Left bundle branch block   . Lymphedema   . Metastatic breast cancer ([place])    a. to bone.  . MI (myocardial infarction) ([place])   . Mild aortic stenosis 10/2017  . Morbid obesity ([place])   . PSVT (paroxysmal supraventricular tachycardia) ([place])    a. per [facility] notes, seen on event monitor in 2014.  . Pulmonary nodules     Tobacco Use: Social History   Tobacco Use  Smoking Status Former Smoker  . Packs/day: 2.00  . Years: 18.00  . Pack years: 36.00  . Types: Cigarettes  . Last attempt to quit: 12/01/1980  . Years since quitting: 37.4  Smokeless Tobacco Never Used    Labs: Recent Review [facility] manager for [facility] Cardiac and Pulmonary Rehab Latest  Ref Rng & Units 11/23/2017   TCO2 22 - 32 mmol/L 25      Capillary Blood Glucose: Lab Results  Component Value Date   GLUCAP 98 11/27/2017   GLUCAP 142 (H) 11/26/2017   GLUCAP 72 11/26/2017   GLUCAP 89 11/26/2017   GLUCAP 113 (H) 11/26/2017     Exercise Target Goals: Date: 04/19/18  Exercise Program Goal: Individual exercise prescription set using results from initial 6 min walk test and THRR while considering  patient's activity barriers and safety.   Exercise Prescription Goal: Starting with aerobic activity 30 plus minutes a day, 3 days per week for initial exercise prescription. Provide home exercise prescription and guidelines that participant acknowledges understanding prior to discharge.  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 HR  91 bpm     Max Ex. BP  164/78        Oxygen Initial Assessment:   Oxygen Re-Evaluation:   Oxygen Discharge (Final Oxygen Re-Evaluation):   Initial Exercise Prescription: Initial Exercise Prescription - 04/19/18 1000      Date of Initial Exercise RX and Referring Provider   Date  04/19/18    Referring Provider  Dr. Chryl Heck      NuStep   Level  1    SPM  48    Minutes  15    METs  1.8      Arm Ergometer   Level  1.5    Watts  15    RPM  15    Minutes  20    METs  2.2      Prescription Details   Frequency (times per week)  3    Duration  Progress to 30 minutes of continuous aerobic without signs/symptoms of physical distress      Intensity   THRR 40-80% of Max Heartrate  107-121-135    Ratings of Perceived Exertion  11-13    Perceived Dyspnea  0-4      Progression   Progression  Continue progressive overload as per policy without signs/symptoms or physical distress.      Resistance Training   Training Prescription  Yes    Weight  1    Reps  10-15       Perform Capillary Blood Glucose checks as needed.  Exercise Prescription Changes:   Exercise Comments:   Exercise Goals and Review: Exercise Goals    Row Name 04/19/18  1011              Exercise Goals   Increase Physical Activity  Yes       Intervention  Provide advice, education, support and counseling about physical activity/exercise needs.;Develop an individualized exercise prescription for aerobic and resistive training based on initial evaluation findings, risk stratification, comorbidities and participant's personal goals.       Expected Outcomes  Short Term: Attend rehab on a regular basis to increase amount of physical activity.       Increase Strength and Stamina  Yes       Intervention  Provide advice, education, support and counseling about physical activity/exercise needs.;Develop an individualized exercise prescription for aerobic and resistive training based on initial evaluation findings, risk stratification, comorbidities and participant's personal goals.       Expected Outcomes  Short Term: Increase workloads from initial exercise prescription for resistance, speed, and METs.;Long Term: Improve cardiorespiratory fitness, muscular endurance and strength as measured by increased METs and functional capacity (6MWT);Short Term: Perform resistance training exercises routinely during rehab and add in resistance training at home       Able to understand and use rate of perceived exertion (RPE) scale  Yes       Intervention  Provide education and explanation on how to use RPE scale       Expected Outcomes  Short Term: Able to use RPE daily in rehab to express subjective intensity level;Long Term:  Able to use RPE to guide intensity level when exercising independently       Able to understand and use Dyspnea scale  Yes       Intervention  Provide education and explanation on how to use Dyspnea scale       Expected Outcomes  Short Term: Able to use Dyspnea scale daily in rehab to express subjective sense of shortness of breath during exertion;Long Term: Able to use Dyspnea scale to guide intensity level when exercising independently       Knowledge and  understanding of Target Heart Rate Range (THRR)  Yes       Intervention  Provide education and explanation of THRR including how the numbers were predicted and where they are located for reference       Expected Outcomes  Short Term: Able to state/look up THRR;Long Term: Able to use THRR to govern intensity when exercising independently;Short Term: Able to use daily as guideline for intensity in rehab       Able to check pulse independently  Yes       Intervention  Provide education and demonstration on how to check pulse in carotid and radial arteries.;Review the importance of being able to check your own pulse for safety during independent exercise       Expected Outcomes  Short Term: Able to explain why pulse checking is important during independent exercise;Long Term: Able to check pulse independently and accurately       Understanding of Exercise Prescription  Yes       Intervention  Provide education, explanation, and written materials on patient's individual exercise prescription       Expected Outcomes  Short Term: Able to explain program exercise prescription;Long Term: Able to explain home exercise prescription to exercise independently          Exercise Goals Re-Evaluation :    Discharge Exercise Prescription (Final Exercise Prescription Changes):   Nutrition:  Target Goals: Understanding of nutrition guidelines, daily intake of sodium 1500mg , cholesterol 200mg , calories  30% from fat and 7% or less from saturated fats, daily to have 5 or more servings of fruits and vegetables.  Biometrics: Pre Biometrics - 04/19/18 1012      Pre Biometrics   Height  5' (1.524 m)    Weight  255 lb 15.3 oz (116.1 kg)    Waist Circumference  52.3 inches    Hip Circumference  54.3 inches    Waist to Hip Ratio  0.96 %    BMI (Calculated)  49.99    Triceps Skinfold  30 mm    % Body Fat  59.4 %    Grip Strength  47.53 kg    Flexibility  12.83 in    Single Leg Stand  0 seconds         Nutrition Therapy Plan and Nutrition Goals: Nutrition Therapy & Goals - 04/19/18 1140      Personal Nutrition Goals   Personal Goal #2  Patient is eating a low sodium, low fat diet.     Additional Goals?  No       Nutrition Assessments: Nutrition Assessments - 04/19/18 1141      MEDFICTS Scores   Pre Score  79       Nutrition Goals Re-Evaluation:   Nutrition Goals Discharge (Final Nutrition Goals Re-Evaluation):   Psychosocial: Target Goals: Acknowledge presence or absence of significant depression and/or stress, maximize coping skills, provide positive support system. Participant is able to verbalize types and ability to use techniques and skills needed for reducing stress and depression.  Initial Review & Psychosocial Screening: Initial Psych Review & Screening - 04/19/18 1144      Initial Review   Current issues with  Current Depression;Current Sleep Concerns;Current Stress Concerns    Source of Stress Concerns  Unable to participate in former interests or hobbies;Unable to perform yard/household activities;Chronic Illness    Comments  Patient feels that now that she has moved closer to family and has returned to her family church she will beging to feel better because she has a support system.       Family Dynamics   Good Support System?  Yes      Barriers   Psychosocial barriers to participate in program  Psychosocial barriers identified (see note)      Screening Interventions   Interventions  Encouraged to exercise;Provide feedback about the scores to participant    Expected Outcomes  Short Term goal: Identification and review with participant of any Quality of Life or Depression concerns found by scoring the questionnaire.;Long Term goal: The participant improves quality of Life and PHQ9 Scores as seen by post scores and/or verbalization of changes       Quality of Life Scores: Quality of Life - 04/19/18 1013      Quality of Life Scores    Health/Function Pre  13.38 %    Socioeconomic Pre  26.4 %    Psych/Spiritual Pre  18 %    Family Pre  26 %    GLOBAL Pre  18.39 %      Scores of 19 and below usually indicate a poorer quality of life in these areas.  A difference of  2-3 points is a clinically meaningful difference.  A difference of 2-3 points in the total score of the Quality of Life Index has been associated with significant improvement in overall quality of life, self-image, physical symptoms, and general health in studies assessing change in quality of life.  PHQ-9: Recent Review Flowsheet Data  Depression screen PHQ 2/9 04/19/2018   Decreased Interest 1   Down, Depressed, Hopeless 1   PHQ - 2 Score 2   Altered sleeping 3   Tired, decreased energy 2   Change in appetite 3   Feeling bad or failure about yourself  0   Trouble concentrating 2   Moving slowly or fidgety/restless 1   Suicidal thoughts 0   PHQ-9 Score 13   Difficult doing work/chores Very difficult     Interpretation of Total Score  Total Score Depression Severity:  1-4 = Minimal depression, 5-9 = Mild depression, 10-14 = Moderate depression, 15-19 = Moderately severe depression, 20-27 = Severe depression   Psychosocial Evaluation and Intervention: Psychosocial Evaluation - 04/19/18 1146      Psychosocial Evaluation & Interventions   Interventions  Stress management education;Relaxation education;Encouraged to exercise with the program and follow exercise prescription    Continue Psychosocial Services   Follow up required by staff       Psychosocial Re-Evaluation:   Psychosocial Discharge (Final Psychosocial Re-Evaluation):   Vocational Rehabilitation: Provide vocational rehab assistance to qualifying candidates.   Vocational Rehab Evaluation & Intervention: Vocational Rehab - 04/19/18 1130      Initial Vocational Rehab Evaluation & Intervention   Assessment shows need for Vocational Rehabilitation  No        Education: Education Goals: Education classes will be provided on a weekly basis, covering required topics. Participant will state understanding/return demonstration of topics presented.  Learning Barriers/Preferences: Learning Barriers/Preferences - 04/19/18 1114      Learning Barriers/Preferences   Learning Barriers  None    Learning Preferences  Pictoral;Video;Verbal Instruction;Skilled Demonstration;Individual Instruction       Education Topics: Hypertension, Hypertension Reduction -Define heart disease and high blood pressure. Discus how high blood pressure affects the body and ways to reduce high blood pressure.   Exercise and Your Heart -Discuss why it is important to exercise, the FITT principles of exercise, normal and abnormal responses to exercise, and how to exercise safely.   Angina -Discuss definition of angina, causes of angina, treatment of angina, and how to decrease risk of having angina.   Cardiac Medications -Review what the following cardiac medications are used for, how they affect the body, and side effects that may occur when taking the medications.  Medications include Aspirin, Beta blockers, calcium channel blockers, ACE Inhibitors, angiotensin receptor blockers, diuretics, digoxin, and antihyperlipidemics.   Congestive Heart Failure -Discuss the definition of CHF, how to live with CHF, the signs and symptoms of CHF, and how keep track of weight and sodium intake.   Heart Disease and Intimacy -Discus the effect sexual activity has on the heart, how changes occur during intimacy as we age, and safety during sexual activity.   Smoking Cessation / COPD -Discuss different methods to quit smoking, the health benefits of quitting smoking, and the definition of COPD.   Nutrition I: Fats -Discuss the types of cholesterol, what cholesterol does to the heart, and how cholesterol levels can be controlled.   Nutrition II: Labels -Discuss the different  components of food labels and how to read food label   Heart Parts/Heart Disease and PAD -Discuss the anatomy of the heart, the pathway of blood circulation through the heart, and these are affected by heart disease.   Stress I: Signs and Symptoms -Discuss the causes of stress, how stress may lead to anxiety and depression, and ways to limit stress.   Stress II: Relaxation -Discuss different types of relaxation techniques  to limit stress.   Warning Signs of Stroke / TIA -Discuss definition of a stroke, what the signs and symptoms are of a stroke, and how to identify when someone is having stroke.   Knowledge Questionnaire Score: Knowledge Questionnaire Score - 04/19/18 1130      Knowledge Questionnaire Score   Pre Score  22/24       Core Components/Risk Factors/Patient Goals at Admission: Personal Goals and Risk Factors at Admission - 04/19/18 1141      Core Components/Risk Factors/Patient Goals on Admission    Weight Management  Yes    Intervention  Weight Management/Obesity: Establish reasonable short term and long term weight goals.    Admit Weight  255 lb 6.4 oz (115.8 kg)    Goal Weight: Short Term  245 lb 6.4 oz (111.3 kg)    Goal Weight: Long Term  235 lb 6.4 oz (106.8 kg)    Expected Outcomes  Short Term: Continue to assess and modify interventions until short term weight is achieved;Long Term: Adherence to nutrition and physical activity/exercise program aimed toward attainment of established weight goal    Personal Goal Other  Yes    Personal Goal  Gain core strength, Lose 15 lbs, be able to stand and prep food in the kitchen.    Intervention  Attend CR 3 x week and supplement with 2 x week exercise at home.     Expected Outcomes  Achieve personal goals.        Core Components/Risk Factors/Patient Goals Review:    Core Components/Risk Factors/Patient Goals at Discharge (Final Review):    ITP Comments: ITP Comments    Row Name 04/19/18 1135            ITP Comments  [place] is [place] from [place] due to moving to [place], [place]. She has completed 13 visit in [place] and will do her remaining 23 visits here at [facility]. they have transferred her records and we have a signed referral. She will continue using the doctors in [place] to follow her care.           Comments: Patient arrived for 1st visit/orientation/education at 0800. Patient was referred to CR by Dr. Franchot Heidelberg from [facility] due to NSTEMI (I21.4) and Stent Placement (Z95.5). During orientation advised patient on arrival and appointment times what to wear, what to do before, during and after exercise. Reviewed attendance and class policy. Talked about inclement weather and class consultation policy. Pt is scheduled to return Cardiac Rehab on 04/21/18 at 11:00. Pt was advised to come to class 15 minutes before class starts. Patient was also given instructions on meeting with the dietician and attending the Family Structure classes. Discussed RPE/Dpysnea scales. Discussed initial THR and how to find their radial and/or carotid pulse. Discussed the initial exercise prescription and how this effects their progress. Pt is eager to get started. Patient did not participated in warm up stretches nor the light weights or resistance bands. Patient did not do the walk test. It was done at [facility]. Patient is transferring and we will use their walk test results as our entrance and we will conduct and exit walk test. Patient c/o knee pain, foot pain, shoulder pain and low/mid back pain. Her pain is chronic and she said that it is constant and she has learned to live with the pain. She uses hot and cold compresses to help relieve the pain and will take an Oxycodone as needed. Patient was  measured for the equipment. Discussed equipment safety with patient. Took patient pre-anthropometric measurements. Patient finished visit at 1045.

## 2018-04-19 NOTE — Progress Notes (Signed)
Daily Session Note  Patient Details  Name: Stacey [name] MRN: [id number] Date of Birth: 12/17/1945 Referring Provider:     CARDIAC REHAB PHASE II ORIENTATION from 04/19/2018 in [facility] [place] CARDIAC RE[facility]  Referring Provider  Dr. Komada      Encounter Date: 04/19/2018  Check In: Session Check In - 04/19/18 0800      Check-In   Location  [facility]    Staff Present  Diane Coad, MS, EP, CHC, Exercise Physiologist;Gregory Cowan, BS, EP, Exercise Physiologist;Debra Johnson, RN, BSN    Supervising physician immediately available to respond to emergencies  See telemetry face sheet for immediately available MD    Medication changes reported      No    Fall or balance concerns reported     Yes    Comments  Uses a walker and has really bad pain in both knees (more on (L) than (R))    Tobacco Cessation  -- Quit 1982    Warm-up and Cool-down  Performed as group-led instruction    Resistance Training Performed  Yes    VAD Patient?  No      Pain Assessment   Currently in Pain?  Yes    Pain Score  6     Pain Location  Knee    Pain Orientation  Left    Pain Descriptors / Indicators  Jabbing    Pain Type  Chronic pain    Pain Onset  More than a month ago    Pain Frequency  Constant    Aggravating Factors   Walking or standing for long periods    Multiple Pain Sites  Yes Low and mid back, (L) foot, shoulders       Capillary Blood Glucose: No results found for this or any previous visit (from the past 24 hour(s)).    Social History   Tobacco Use  Smoking Status Former Smoker  . Packs/day: 2.00  . Years: 18.00  . Pack years: 36.00  . Types: Cigarettes  . Last attempt to quit: 12/01/1980  . Years since quitting: 37.4  Smokeless Tobacco Never Used    Goals Met:  Personal goals reviewed Patient did not exercise today. This visit was for orientation only. Transferring from [facility] walk test already done.   Goals Unmet:  Not Applicable  Comments: Check  out: 10:45   Dr. Suresh Koneswaran is Medical Director for [facility]. 

## 2018-04-19 NOTE — Progress Notes (Signed)
Cardiac/Pulmonary Rehab Medication Review by a Pharmacist  Does the patient  feel that his/her medications are working for him/her?  yes  Has the patient been experiencing any side effects to the medications prescribed?  no  Does the patient measure his/her own blood pressure or blood glucose at home?  no   Does the patient have any problems obtaining medications due to transportation or finances?   no  Understanding of regimen: good Understanding of indications: good Potential of compliance: excellent  Questions asked to Determine Patient Understanding of Medication Regimen:  1. What is the name of the medication?  2. What is the medication used for?  3. When should it be taken?  4. How much should be taken?  5. How will you take it?  6. What side effects should you report?  Understanding Defined as: Excellent: All questions above are correct Good: Questions 1-4 are correct Fair: Questions 1-2 are correct  Poor: 1 or none of the above questions are correct   Pharmacist comments: Overall, patient seems to know her medications well and willing to take them. Talked about her vitamins in how she is taking two of them and could get rid of one of them.  Also, patient has both atenolol and toprol-XL in bottles, one of which was started in hospital in December. Patient is going to call cardiologist to confirm which to take.    Ramond Craver 04/19/2018 8:49 AM

## 2018-04-21 ENCOUNTER — Encounter (HOSPITAL_COMMUNITY)
Admission: RE | Admit: 2018-04-21 | Discharge: 2018-04-21 | Disposition: A | Discharge status: Home / Self Care | Payer: Medicare Other | Source: Ambulatory Visit | Attending: Cardiology | Admitting: Cardiology

## 2018-04-21 ENCOUNTER — Encounter (HOSPITAL_COMMUNITY): Payer: Medicare Other

## 2018-04-21 DIAGNOSIS — I447 Left bundle-branch block, unspecified: Secondary | ICD-10-CM | POA: Diagnosis not present

## 2018-04-21 DIAGNOSIS — I21A1 Myocardial infarction type 2: Secondary | ICD-10-CM | POA: Diagnosis present

## 2018-04-21 DIAGNOSIS — Z7902 Long term (current) use of antithrombotics/antiplatelets: Secondary | ICD-10-CM | POA: Diagnosis not present

## 2018-04-21 DIAGNOSIS — I2510 Atherosclerotic heart disease of native coronary artery without angina pectoris: Secondary | ICD-10-CM | POA: Diagnosis not present

## 2018-04-21 DIAGNOSIS — Z955 Presence of coronary angioplasty implant and graft: Secondary | ICD-10-CM

## 2018-04-21 DIAGNOSIS — Z79899 Other long term (current) drug therapy: Secondary | ICD-10-CM | POA: Diagnosis not present

## 2018-04-21 DIAGNOSIS — I214 Non-ST elevation (NSTEMI) myocardial infarction: Secondary | ICD-10-CM

## 2018-04-21 DIAGNOSIS — Z87891 Personal history of nicotine dependence: Secondary | ICD-10-CM | POA: Diagnosis not present

## 2018-04-21 DIAGNOSIS — I110 Hypertensive heart disease with heart failure: Secondary | ICD-10-CM | POA: Diagnosis not present

## 2018-04-21 DIAGNOSIS — I509 Heart failure, unspecified: Secondary | ICD-10-CM | POA: Diagnosis not present

## 2018-04-21 DIAGNOSIS — Z7982 Long term (current) use of aspirin: Secondary | ICD-10-CM | POA: Diagnosis not present

## 2018-04-21 DIAGNOSIS — E785 Hyperlipidemia, unspecified: Secondary | ICD-10-CM | POA: Diagnosis not present

## 2018-04-21 DIAGNOSIS — I252 Old myocardial infarction: Secondary | ICD-10-CM | POA: Diagnosis not present

## 2018-04-21 NOTE — Progress Notes (Signed)
Daily Session Note  Patient Details  Name: Stacey [name] MRN: [id number] Date of Birth: November 30, 1946 Referring Provider:     [place] from 04/19/2018 in [place]  Referring Provider  Dr. Chryl Heck      Encounter Date: 04/21/2018  Check In: Session Check In - 04/21/18 1237      Check-In   Location  [facility]    Staff Present  Russella Dar, MS, EP, [facility], Exercise Physiologist;Jessilynn Taft Luther Parody, BS, EP, Exercise Physiologist    Supervising physician immediately available to respond to emergencies  See telemetry face sheet for immediately available MD    Medication changes reported      No    Fall or balance concerns reported     Yes    Comments  Uses a walker and has really bad pain in both knees (more on (L) than (R))    Warm-up and Cool-down  Performed as group-led instruction    Resistance Training Performed  Yes    VAD Patient?  No      Pain Assessment   Currently in Pain?  No/denies    Pain Score  0-No pain    Multiple Pain Sites  No       Capillary Blood Glucose: No results found for this or any previous visit (from the past 24 hour(s)).    Social History   Tobacco Use  Smoking Status Former Smoker  . Packs/day: 2.00  . Years: 18.00  . Pack years: 36.00  . Types: Cigarettes  . Last attempt to quit: 12/01/1980  . Years since quitting: 37.4  Smokeless Tobacco Never Used    Goals Met:  Independence with exercise equipment Exercise tolerated well No report of cardiac concerns or symptoms Strength training completed today  Goals Unmet:  Not Applicable  Comments: Check out 1200   Dr. Kate Sable is Medical Director for [place] and Pulmonary Re[facility].

## 2018-04-23 ENCOUNTER — Encounter (HOSPITAL_COMMUNITY)
Admission: RE | Admit: 2018-04-23 | Discharge: 2018-04-23 | Disposition: A | Discharge status: Home / Self Care | Payer: Medicare Other | Source: Ambulatory Visit | Attending: Cardiology | Admitting: Cardiology

## 2018-04-23 DIAGNOSIS — Z955 Presence of coronary angioplasty implant and graft: Secondary | ICD-10-CM

## 2018-04-23 DIAGNOSIS — I214 Non-ST elevation (NSTEMI) myocardial infarction: Secondary | ICD-10-CM | POA: Diagnosis not present

## 2018-04-23 NOTE — Progress Notes (Signed)
Daily Session Note  Patient Details  Name: Stacey [name] MRN: [id number] Date of Birth: 1946/02/18 Referring Provider:     [place] from 04/19/2018 in [place]  Referring Provider  Dr. Chryl Heck      Encounter Date: 04/23/2018  Check In: Session Check In - 04/23/18 1100      Check-In   Location  [facility]    Staff Present  Diane Angelina Pih, MS, EP, [facility], Exercise Physiologist;Gregory Luther Parody, BS, EP, Exercise Physiologist;Genever Hentges Wynetta Emery, RN, BSN    Supervising physician immediately available to respond to emergencies  See telemetry face sheet for immediately available MD    Medication changes reported      No    Fall or balance concerns reported     Yes    Comments  Uses a walker and has really bad pain in both knees (more on (L) than (R))    Warm-up and Cool-down  Performed as group-led instruction    Resistance Training Performed  Yes    VAD Patient?  No      Pain Assessment   Currently in Pain?  No/denies    Pain Score  0-No pain    Multiple Pain Sites  No       Capillary Blood Glucose: No results found for this or any previous visit (from the past 24 hour(s)).    Social History   Tobacco Use  Smoking Status Former Smoker  . Packs/day: 2.00  . Years: 18.00  . Pack years: 36.00  . Types: Cigarettes  . Last attempt to quit: 12/01/1980  . Years since quitting: 37.4  Smokeless Tobacco Never Used    Goals Met:  Independence with exercise equipment Exercise tolerated well No report of cardiac concerns or symptoms Strength training completed today  Goals Unmet:  Not Applicable  Comments: Check out 1200.   Dr. Kate Sable is Medical Director for [facility] and Pulmonary Rehab.

## 2018-04-26 ENCOUNTER — Encounter (HOSPITAL_COMMUNITY): Payer: Medicare Other

## 2018-04-28 ENCOUNTER — Encounter (HOSPITAL_COMMUNITY): Payer: Medicare Other

## 2018-04-30 ENCOUNTER — Encounter (HOSPITAL_COMMUNITY): Payer: Medicare Other

## 2018-04-30 ENCOUNTER — Other Ambulatory Visit: Payer: Self-pay

## 2018-04-30 ENCOUNTER — Emergency Department (HOSPITAL_COMMUNITY)
Admission: EM | Admit: 2018-04-30 | Discharge: 2018-04-30 | Disposition: A | Discharge status: Home / Self Care | Payer: Medicare Other | Attending: Emergency Medicine | Admitting: Emergency Medicine

## 2018-04-30 ENCOUNTER — Encounter (HOSPITAL_COMMUNITY): Payer: Self-pay | Admitting: Emergency Medicine

## 2018-04-30 ENCOUNTER — Emergency Department (HOSPITAL_COMMUNITY): Payer: Medicare Other

## 2018-04-30 DIAGNOSIS — Y929 Unspecified place or not applicable: Secondary | ICD-10-CM | POA: Insufficient documentation

## 2018-04-30 DIAGNOSIS — S8991XA Unspecified injury of right lower leg, initial encounter: Secondary | ICD-10-CM | POA: Diagnosis present

## 2018-04-30 DIAGNOSIS — I110 Hypertensive heart disease with heart failure: Secondary | ICD-10-CM | POA: Diagnosis not present

## 2018-04-30 DIAGNOSIS — Z79899 Other long term (current) drug therapy: Secondary | ICD-10-CM | POA: Insufficient documentation

## 2018-04-30 DIAGNOSIS — Z7902 Long term (current) use of antithrombotics/antiplatelets: Secondary | ICD-10-CM | POA: Insufficient documentation

## 2018-04-30 DIAGNOSIS — M25512 Pain in left shoulder: Secondary | ICD-10-CM | POA: Diagnosis not present

## 2018-04-30 DIAGNOSIS — I2510 Atherosclerotic heart disease of native coronary artery without angina pectoris: Secondary | ICD-10-CM | POA: Diagnosis not present

## 2018-04-30 DIAGNOSIS — W0110XA Fall on same level from slipping, tripping and stumbling with subsequent striking against unspecified object, initial encounter: Secondary | ICD-10-CM | POA: Insufficient documentation

## 2018-04-30 DIAGNOSIS — Y999 Unspecified external cause status: Secondary | ICD-10-CM | POA: Insufficient documentation

## 2018-04-30 DIAGNOSIS — S8001XA Contusion of right knee, initial encounter: Secondary | ICD-10-CM | POA: Diagnosis not present

## 2018-04-30 DIAGNOSIS — I5033 Acute on chronic diastolic (congestive) heart failure: Secondary | ICD-10-CM | POA: Insufficient documentation

## 2018-04-30 DIAGNOSIS — Z87891 Personal history of nicotine dependence: Secondary | ICD-10-CM | POA: Diagnosis not present

## 2018-04-30 DIAGNOSIS — Z7982 Long term (current) use of aspirin: Secondary | ICD-10-CM | POA: Insufficient documentation

## 2018-04-30 DIAGNOSIS — Z853 Personal history of malignant neoplasm of breast: Secondary | ICD-10-CM | POA: Insufficient documentation

## 2018-04-30 DIAGNOSIS — Y9301 Activity, walking, marching and hiking: Secondary | ICD-10-CM | POA: Diagnosis not present

## 2018-04-30 MED ORDER — LORAZEPAM 1 MG PO TABS
1.0000 mg | ORAL_TABLET | Freq: Once | ORAL | Status: DC
  Filled 2018-04-30: qty 1

## 2018-04-30 MED ORDER — IBUPROFEN 400 MG PO TABS
400.0000 mg | ORAL_TABLET | Freq: Once | ORAL | Status: AC
  Administered 2018-04-30: 400 mg via ORAL
  Filled 2018-04-30: qty 1

## 2018-04-30 MED ORDER — HYDROCODONE-ACETAMINOPHEN 5-325 MG PO TABS
1.0000 | ORAL_TABLET | Freq: Once | ORAL | Status: DC
  Filled 2018-04-30: qty 1

## 2018-04-30 MED ORDER — BUPIVACAINE HCL (PF) 0.25 % IJ SOLN
5.0000 mL | Freq: Once | INTRAMUSCULAR | Status: AC
  Administered 2018-04-30: 5 mL
  Filled 2018-04-30: qty 30

## 2018-04-30 MED ORDER — HYDROCODONE-ACETAMINOPHEN 5-325 MG PO TABS: 2.0000 | ORAL_TABLET | Freq: Four times a day (QID) | ORAL | 0 refills | Status: DC | PRN

## 2018-04-30 NOTE — ED Triage Notes (Signed)
Pt states she fell on Sunday. C/o right knee pain and left shoulder pain. States he "knee went out on her". Denies hitting head/loc.

## 2018-04-30 NOTE — ED Provider Notes (Signed)
[facility] EMERGENCY DEPARTMENT Provider Note   CSN: [id number] Arrival date & time: 04/30/18  1630     History   Chief Complaint Chief Complaint  Patient presents with  . Fall    HPI Stacey [name] is a 72 y.o. female.  HPI   72 year old female presented after a fall.  She lost her balance and fell on Sunday.  She is having pain in her right knee and her left shoulder.  She states that her knee does run out on her.  She did not strike her head.  Denies any headache significant acute headache, neck or back pain.  No numbness or tingling.  Past Medical History:  Diagnosis Date  . CAD in native artery    a.DES to ramus 2005 with late stent thrombosis 2006 tx with PTCA. 12/18 PCI/DES x1 to mRCA, EF 50-55%  . CHF (congestive heart failure) ([place])   . Chronic pain   . Hyperlipidemia   . Hypertension   . LBBB (left bundle branch block)   . Left bundle branch block   . Lymphedema   . Metastatic breast cancer ([place])    a. to bone.  . MI (myocardial infarction) ([place])   . Mild aortic stenosis 10/2017  . Morbid obesity ([place])   . PSVT (paroxysmal supraventricular tachycardia) ([place])    a. per [facility] notes, seen on event monitor in 2014.  . Pulmonary nodules     Patient Active Problem List   Diagnosis Date Noted  . Dyspnea 11/30/2017  . Non-ST elevation (NSTEMI) myocardial infarction ([place])   . Hypertension 11/23/2017  . Left bundle branch block 11/23/2017  . Acute on chronic diastolic CHF (congestive heart failure) ([place]) 11/23/2017  . CAD (coronary artery disease) 11/23/2017  . Breast cancer ([place]) 11/23/2017  . Acute respiratory failure with hypoxia ([place]) 11/23/2017    Past Surgical History:  Procedure Laterality Date  . CORONARY STENT INTERVENTION N/A 11/26/2017   Procedure: CORONARY STENT INTERVENTION;  Surgeon: Martinique, Peter M, MD;  Location: [place] [facility];  Service: Cardiovascular;  Laterality: N/A;  . CORONARY STENT PLACEMENT    . LEFT HEART CATH AND CORONARY  ANGIOGRAPHY N/A 11/26/2017   Procedure: LEFT HEART CATH AND CORONARY ANGIOGRAPHY;  Surgeon: Martinique, Peter M, MD;  Location: [facility];  Service: Cardiovascular;  Laterality: N/A;     OB History   None      Home Medications    Prior to Admission medications   Medication Sig Start Date End Date Taking? Authorizing Provider  aspirin 81 MG chewable tablet Chew 1 tablet (81 mg total) by mouth daily. 11/27/17   Cheryln Manly, NP  atenolol (TENORMIN) 25 MG tablet Take 25 mg by mouth daily.    [provider]  atorvastatin (LIPITOR) 80 MG tablet Take 80 mg by mouth daily.    [provider]  b complex vitamins tablet Take 1 tablet by mouth daily.    [provider]  Calcium-Phosphorus-Vitamin D (CITRACAL +D3) 021-115-520 MG-MG-UNIT CHEW Chew 2 tablets by mouth 2 (two) times daily.    [provider]  clopidogrel (PLAVIX) 75 MG tablet Take 1 tablet (75 mg total) by mouth daily. 12/03/17   [facility], Tami Lin, PA  gabapentin (NEURONTIN) 400 MG capsule Take 400-800 mg by mouth See admin instructions. Take 400mg  in the morning, 400mg  capsule in the afternoon, then take 800mg  at bedtime.    [provider]  hydrochlorothiazide (HYDRODIURIL) 25 MG tablet Take 25 mg by mouth  daily.    [provider]  HYDROcodone-acetaminophen (NORCO/VICODIN) 5-325 MG tablet Take 2 tablets by mouth every 6 (six) hours as needed. 04/30/18   Virgel Manifold, MD  letrozole [facility]) 2.5 MG tablet Take 2.5 mg by mouth daily.    [provider]  losartan (COZAAR) 50 MG tablet Take 1 tablet (50 mg total) by mouth daily. 12/03/17   [facility], Tami Lin, PA  Multiple Vitamin (MULTIVITAMIN WITH MINERALS) TABS tablet Take 1 tablet by mouth daily.    [provider]  nitroGLYCERIN (NITROSTAT) 0.4 MG SL tablet Place 1 tablet (0.4 mg total) under the tongue every 5 (five) minutes as needed. 11/27/17   Cheryln Manly, NP  Oxycodone HCl 10 MG TABS Take 10  mg by mouth every 6 (six) hours as needed (for pain).    [provider]  Potassium 99 MG TABS Take 1 tablet by mouth daily.    [provider]    Family History Family History  Problem Relation Age of Onset  . Sudden Cardiac Death Neg Hx     Social History Social History   Tobacco Use  . Smoking status: Former Smoker    Packs/day: 2.00    Years: 18.00    Pack years: 36.00    Types: Cigarettes    Last attempt to quit: 12/01/1980    Years since quitting: 37.4  . Smokeless tobacco: Never Used  Substance Use Topics  . Alcohol use: No    Frequency: Never  . Drug use: No     Allergies   Brilinta [ticagrelor]   Review of Systems Review of Systems   Physical Exam Updated Vital Signs BP (!) 157/80   Pulse 87   Temp 97.8 F (36.6 C)   Resp 18   Ht 5' (1.524 m)   Wt 115.7 kg (255 lb)   SpO2 100%   BMI 49.80 kg/m   Physical Exam  Constitutional: She appears well-developed and well-nourished. No distress.  HENT:  Head: Normocephalic and atraumatic.  Eyes: Conjunctivae are normal. Right eye exhibits no discharge. Left eye exhibits no discharge.  Neck: Neck supple.  Cardiovascular: Normal rate, regular rhythm and normal heart sounds. Exam reveals no gallop and no friction rub.  No murmur heard. Pulmonary/Chest: Effort normal and breath sounds normal. No respiratory distress.  Abdominal: Soft. She exhibits no distension. There is no tenderness.  Musculoskeletal: She exhibits no edema or tenderness.  Tenderness to palpation over right anterior knee and proximal shin.  Pain ecchymosis noted.  She can actively range the although some increased pain.  No effusion appreciated.  No ligamentous laxity.  Neurovascular intact.  Increased pain with range of motion of the left shoulder but seems to be more in the trapezius.  Shoulder itself is nontender.  Midline spinal tenderness.  Neurological: She is alert.  Skin: Skin is warm and dry.  Psychiatric: She has a  normal mood and affect. Her behavior is normal. Thought content normal.  Nursing note and vitals reviewed.    ED Treatments / Results  Labs (all labs ordered are listed, but only abnormal results are displayed) Labs Reviewed - No data to display  EKG None  Radiology Dg Shoulder Left  Result Date: 04/30/2018 CLINICAL DATA:  Fall 1 week ago with left shoulder pain, initial encounter EXAM: LEFT SHOULDER - 2+ VIEW COMPARISON:  None. FINDINGS: There is no evidence of fracture or dislocation. There is no evidence of arthropathy or other focal bone abnormality. Soft tissues are unremarkable. IMPRESSION:  No acute abnormality noted. Electronically Signed   By: Inez Catalina M.D.   On: 04/30/2018 18:08   Dg Knee Complete 4 Views Right  Result Date: 04/30/2018 CLINICAL DATA:  Right knee pain since a fall 1 week ago. Initial encounter. EXAM: RIGHT KNEE - COMPLETE 4+ VIEW COMPARISON:  None. FINDINGS: There is no acute bony or joint abnormality. The patient has advanced osteoarthritis about the knee appearing most severe in the medial compartment there is bone-on-bone joint space narrowing. No joint effusion. IMPRESSION: No acute finding. Advanced osteoarthritis about the knee. Electronically Signed   By: Inge Rise M.D.   On: 04/30/2018 18:09    Procedures Procedures (including critical care time)  A trigger point injection was performed at the site of maximal tenderness L trapezius using 2cc of 0.5% marcaine after standard prep. This was well tolerated, and followed by partial relief of pain.  Medications Ordered in ED Medications  HYDROcodone-acetaminophen (NORCO/VICODIN) 5-325 MG per tablet 1 tablet (1 tablet Oral Refused 04/30/18 1716)  LORazepam (ATIVAN) tablet 1 mg (1 mg Oral Refused 04/30/18 1716)  bupivacaine (PF) (MARCAINE) 0.25 % injection 5 mL (has no administration in time range)  ibuprofen (ADVIL,MOTRIN) tablet 400 mg (400 mg Oral Given 04/30/18 1716)     Initial Impression /  Assessment and Plan / ED Course  I have reviewed the triage vital signs and the nursing notes.  Pertinent labs & imaging results that were available during my care of the patient were reviewed by me and considered in my medical decision making (see chart for details).     72 year old knee pain with right knee pain after a fall.  Likely contusion.  Negative imaging.  Suspect shoulder pain is more muscular.  Left trapezius trigger point injection was performed with improvement.   Final Clinical Impressions(s) / ED Diagnoses   Final diagnoses:  Contusion of right knee, initial encounter  Acute pain of left shoulder    ED Discharge Orders        Ordered    HYDROcodone-acetaminophen (NORCO/VICODIN) 5-325 MG tablet  Every 6 hours PRN     04/30/18 1830       Virgel Manifold, MD 05/05/18 1315

## 2018-05-03 ENCOUNTER — Encounter (HOSPITAL_COMMUNITY): Payer: Medicare Other

## 2018-05-05 ENCOUNTER — Encounter (HOSPITAL_COMMUNITY): Payer: Medicare Other

## 2018-05-07 ENCOUNTER — Encounter (HOSPITAL_COMMUNITY): Payer: Medicare Other

## 2018-05-10 ENCOUNTER — Encounter (HOSPITAL_COMMUNITY): Payer: Medicare Other

## 2018-05-12 ENCOUNTER — Encounter (HOSPITAL_COMMUNITY): Payer: Medicare Other

## 2018-05-13 NOTE — Progress Notes (Signed)
Cardiac Individual Treatment Plan  Patient Details  Name: Stacey [name] MRN: [id number] Date of Birth: 07-25-1946 Referring Provider:     [place] from 04/19/2018 in [place]  Referring Provider  Dr. Chryl Heck      Initial Encounter Date:    CARDIAC REHAB PHASE II ORIENTATION from 04/19/2018 in [facility]  Date  04/19/18  Referring Provider  Dr. Chryl Heck      Visit Diagnosis: NSTEMI (non-ST elevated myocardial infarction) [facility])  Status post coronary artery stent placement  Patient's Home Medications on Admission:  Current Outpatient Medications:  .  aspirin 81 MG chewable tablet, Chew 1 tablet (81 mg total) by mouth daily., Disp: , Rfl:  .  atenolol (TENORMIN) 25 MG tablet, Take 25 mg by mouth daily., Disp: , Rfl:  .  atorvastatin (LIPITOR) 80 MG tablet, Take 80 mg by mouth daily., Disp: , Rfl:  .  b complex vitamins tablet, Take 1 tablet by mouth daily., Disp: , Rfl:  .  Calcium-Phosphorus-Vitamin D (CITRACAL +D3) [phone] MG-MG-UNIT CHEW, Chew 2 tablets by mouth 2 (two) times daily., Disp: , Rfl:  .  clopidogrel (PLAVIX) 75 MG tablet, Take 1 tablet (75 mg total) by mouth daily., Disp: 90 tablet, Rfl: 4 .  gabapentin (NEURONTIN) 400 MG capsule, Take 400-800 mg by mouth See admin instructions. Take 400mg  in the morning, 400mg  capsule in the afternoon, then take 800mg  at bedtime., Disp: , Rfl:  .  hydrochlorothiazide (HYDRODIURIL) 25 MG tablet, Take 25 mg by mouth daily., Disp: , Rfl:  .  HYDROcodone-acetaminophen (NORCO/VICODIN) 5-325 MG tablet, Take 2 tablets by mouth every 6 (six) hours as needed., Disp: 12 tablet, Rfl: 0 .  letrozole (FEMARA) 2.5 MG tablet, Take 2.5 mg by mouth daily., Disp: , Rfl:  .  losartan (COZAAR) 50 MG tablet, Take 1 tablet (50 mg total) by mouth daily., Disp: 90 tablet, Rfl: 4 .  Multiple Vitamin (MULTIVITAMIN WITH MINERALS) TABS tablet, Take 1 tablet by mouth daily., Disp: , Rfl:   .  nitroGLYCERIN (NITROSTAT) 0.4 MG SL tablet, Place 1 tablet (0.4 mg total) under the tongue every 5 (five) minutes as needed., Disp: 25 tablet, Rfl: 0 .  Oxycodone HCl 10 MG TABS, Take 10 mg by mouth every 6 (six) hours as needed (for pain)., Disp: , Rfl:  .  Potassium 99 MG TABS, Take 1 tablet by mouth daily., Disp: , Rfl:   Past Medical History: Past Medical History:  Diagnosis Date  . CAD in native artery    a.DES to ramus 2005 with late stent thrombosis 2006 tx with PTCA. 12/18 PCI/DES x1 to mRCA, EF 50-55%  . CHF (congestive heart failure) ([place])   . Chronic pain   . Hyperlipidemia   . Hypertension   . LBBB (left bundle branch block)   . Left bundle branch block   . Lymphedema   . Metastatic breast cancer ([place])    a. to bone.  . MI (myocardial infarction) ([place])   . Mild aortic stenosis 10/2017  . Morbid obesity ([place])   . PSVT (paroxysmal supraventricular tachycardia) ([place])    a. per [facility] notes, seen on event monitor in 2014.  . Pulmonary nodules     Tobacco Use: Social History   Tobacco Use  Smoking Status Former Smoker  . Packs/day: 2.00  . Years: 18.00  . Pack years: 36.00  . Types: Cigarettes  . Last attempt to quit: 12/01/1980  . Years since quitting: 37.4  Smokeless Tobacco Never Used    Labs: Recent Review Flowsheet Data    Labs for ITP Cardiac and Pulmonary Rehab Latest Ref Rng & Units 11/23/2017   TCO2 22 - 32 mmol/L 25      Capillary Blood Glucose: Lab Results  Component Value Date   GLUCAP 98 11/27/2017   GLUCAP 142 (H) 11/26/2017   GLUCAP 72 11/26/2017   GLUCAP 89 11/26/2017   GLUCAP 113 (H) 11/26/2017     Exercise Target Goals:    Exercise Program Goal: Individual exercise prescription set using results from initial 6 min walk test and THRR while considering  patient's activity barriers and safety.   Exercise Prescription Goal: Starting with aerobic activity 30 plus minutes a day, 3 days per week for initial exercise prescription.  Provide home exercise prescription and guidelines that participant acknowledges understanding prior to discharge.  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 HR  91 bpm     Max Ex. BP  164/78        Oxygen Initial Assessment:   Oxygen Re-Evaluation:   Oxygen Discharge (Final Oxygen Re-Evaluation):   Initial Exercise Prescription: Initial Exercise Prescription - 04/19/18 1000      Date of Initial Exercise RX and Referring Provider   Date  04/19/18    Referring Provider  Dr. Chryl Heck      NuStep   Level  1    SPM  48    Minutes  15    METs  1.8      Arm Ergometer   Level  1.5    Watts  15    RPM  15    Minutes  20    METs  2.2      Prescription Details   Frequency (times per week)  3    Duration  Progress to 30 minutes of continuous aerobic without signs/symptoms of physical distress      Intensity   THRR 40-80% of Max Heartrate  107-121-135    Ratings of Perceived Exertion  11-13    Perceived Dyspnea  0-4      Progression   Progression  Continue progressive overload as per policy without signs/symptoms or physical distress.      Resistance Training   Training Prescription  Yes    Weight  1    Reps  10-15       Perform Capillary Blood Glucose checks as  needed.  Exercise Prescription Changes:  Exercise Prescription Changes    Row Name 04/30/18 1400             Response to Exercise   Blood Pressure (Admit)  158/62       Blood Pressure (Exercise)  144/82       Blood Pressure (Exit)  130/72       Heart Rate (Admit)  76 bpm       Heart Rate (Exercise)  105 bpm       Heart Rate (Exit)  85 bpm       Rating of Perceived Exertion (Exercise)  12       Duration  Progress to 30 minutes of  aerobic without signs/symptoms of physical distress       Intensity  THRR New [phone]         Progression   Progression  Continue to progress workloads to maintain intensity without signs/symptoms of physical distress.         Resistance Training   Training Prescription  Yes       Weight  1       Reps  10-15         NuStep   Level  1       SPM  60       Minutes  15       METs  1.6         Arm Ergometer   Level  1.5       Watts  13       RPM  15       Minutes  20       METs  2          Exercise Comments:  Exercise Comments    Row Name 04/30/18 1445           Exercise Comments  Patient has just started CR and will be progressed in time.           Exercise Goals and Review:  Exercise Goals    Row Name 04/19/18 1011             Exercise Goals   Increase Physical Activity  Yes       Intervention  Provide advice, education, support and counseling about physical activity/exercise needs.;Develop an individualized exercise prescription for aerobic and resistive training based on initial evaluation findings, risk stratification, comorbidities and participant's personal goals.       Expected Outcomes  Short Term: Attend rehab on a regular basis to increase amount of physical activity.       Increase Strength and Stamina  Yes       Intervention  Provide advice, education, support and counseling about physical activity/exercise needs.;Develop an individualized exercise prescription for aerobic and resistive training based on  initial evaluation findings, risk stratification, comorbidities and participant's personal goals.       Expected Outcomes  Short Term: Increase workloads from initial exercise prescription for resistance, speed, and METs.;Long Term: Improve cardiorespiratory fitness, muscular endurance and strength as measured by increased METs and functional capacity (6MWT);Short Term: Perform resistance training exercises routinely during rehab and add in resistance training at home       Able to understand and use rate of perceived exertion (RPE) scale  Yes       Intervention  Provide education and explanation on how to use RPE scale       Expected Outcomes  Short Term: Able to use  RPE daily in rehab to express subjective intensity level;Long Term:  Able to use RPE to guide intensity level when exercising independently       Able to understand and use Dyspnea scale  Yes       Intervention  Provide education and explanation on how to use Dyspnea scale       Expected Outcomes  Short Term: Able to use Dyspnea scale daily in rehab to express subjective sense of shortness of breath during exertion;Long Term: Able to use Dyspnea scale to guide intensity level when exercising independently       Knowledge and understanding of Target Heart Rate Range (THRR)  Yes       Intervention  Provide education and explanation of THRR including how the numbers were predicted and where they are located for reference       Expected Outcomes  Short Term: Able to state/look up THRR;Long Term: Able to use THRR to govern intensity when exercising independently;Short Term: Able to use daily as guideline for intensity in rehab       Able to check pulse independently  Yes       Intervention  Provide education and demonstration on how to check pulse in carotid and radial arteries.;Review the importance of being able to check your own pulse for safety during independent exercise       Expected Outcomes  Short Term: Able to explain why pulse  checking is important during independent exercise;Long Term: Able to check pulse independently and accurately       Understanding of Exercise Prescription  Yes       Intervention  Provide education, explanation, and written materials on patient's individual exercise prescription       Expected Outcomes  Short Term: Able to explain program exercise prescription;Long Term: Able to explain home exercise prescription to exercise independently          Exercise Goals Re-Evaluation : Exercise Goals Re-Evaluation    Row Name 05/11/18 0751             Exercise Goal Re-Evaluation   Exercise Goals Review  Increase Physical Activity;Increase Strength and Stamina;Able to understand and use rate of perceived exertion (RPE) scale;Able to check pulse independently;Knowledge and understanding of Target Heart Rate Range (THRR);Able to understand and use Dyspnea scale;Understanding of Exercise Prescription       Comments  Patient has only attended one session of CR. Patient will be progressed in time once attendance is consistant        Expected Outcomes  Patient wishes to increase core strength and to lose 15lbs.            Discharge Exercise Prescription (Final Exercise Prescription Changes): Exercise Prescription Changes - 04/30/18 1400      Response to Exercise   Blood Pressure (Admit)  158/62    Blood Pressure (Exercise)  144/82    Blood Pressure (Exit)  130/72    Heart Rate (Admit)  76 bpm    Heart Rate (Exercise)  105 bpm    Heart Rate (Exit)  85 bpm    Rating of Perceived Exertion (Exercise)  12    Duration  Progress to 30 minutes of  aerobic without signs/symptoms of physical distress    Intensity  THRR New [phone]      Progression   Progression  Continue to progress workloads to maintain intensity without signs/symptoms of physical distress.      Resistance Training   Training Prescription  Yes  Weight  1    Reps  10-15      NuStep   Level  1    SPM  60    Minutes  15     METs  1.6      Arm Ergometer   Level  1.5    Watts  13    RPM  15    Minutes  20    METs  2       Nutrition:  Target Goals: Understanding of nutrition guidelines, daily intake of sodium 1500mg , cholesterol 200mg , calories 30% from fat and 7% or less from saturated fats, daily to have 5 or more servings of fruits and vegetables.  Biometrics: Pre Biometrics - 04/19/18 1012      Pre Biometrics   Height  5' (1.524 m)    Weight  255 lb 15.3 oz (116.1 kg)    Waist Circumference  52.3 inches    Hip Circumference  54.3 inches    Waist to Hip Ratio  0.96 %    BMI (Calculated)  49.99    Triceps Skinfold  30 mm    % Body Fat  59.4 %    Grip Strength  47.53 kg    Flexibility  12.83 in    Single Leg Stand  0 seconds        Nutrition Therapy Plan and Nutrition Goals: Nutrition Therapy & Goals - 04/19/18 1140      Personal Nutrition Goals   Personal Goal #2  Patient is eating a low sodium, low fat diet.     Additional Goals?  No       Nutrition Assessments: Nutrition Assessments - 04/19/18 1141      MEDFICTS Scores   Pre Score  79       Nutrition Goals Re-Evaluation:   Nutrition Goals Discharge (Final Nutrition Goals Re-Evaluation):   Psychosocial: Target Goals: Acknowledge presence or absence of significant depression and/or stress, maximize coping skills, provide positive support system. Participant is able to verbalize types and ability to use techniques and skills needed for reducing stress and depression.  Initial Review & Psychosocial Screening: Initial Psych Review & Screening - 04/19/18 1144      Initial Review   Current issues with  Current Depression;Current Sleep Concerns;Current Stress Concerns    Source of Stress Concerns  Unable to participate in former interests or hobbies;Unable to perform yard/household activities;Chronic Illness    Comments  Patient feels that now that she has moved closer to family and has returned to her family church she  will beging to feel better because she has a support system.       Family Dynamics   Good Support System?  Yes      Barriers   Psychosocial barriers to participate in program  Psychosocial barriers identified (see note)      Screening Interventions   Interventions  Encouraged to exercise;Provide feedback about the scores to participant    Expected Outcomes  Short Term goal: Identification and review with participant of any Quality of Life or Depression concerns found by scoring the questionnaire.;Long Term goal: The participant improves quality of Life and PHQ9 Scores as seen by post scores and/or verbalization of changes       Quality of Life Scores: Quality of Life - 04/19/18 1013      Quality of Life Scores   Health/Function Pre  13.38 %    Socioeconomic Pre  26.4 %    Psych/Spiritual Pre  18 %  Family Pre  26 %    GLOBAL Pre  18.39 %      Scores of 19 and below usually indicate a poorer quality of life in these areas.  A difference of  2-3 points is a clinically meaningful difference.  A difference of 2-3 points in the total score of the Quality of Life Index has been associated with significant improvement in overall quality of life, self-image, physical symptoms, and general health in studies assessing change in quality of life.  PHQ-9: Recent Review Flowsheet Data    Depression screen [facility] 2/9 04/19/2018   Decreased Interest 1   Down, Depressed, Hopeless 1   PHQ - 2 Score 2   Altered sleeping 3   Tired, decreased energy 2   Change in appetite 3   Feeling bad or failure about yourself  0   Trouble concentrating 2   Moving slowly or fidgety/restless 1   Suicidal thoughts 0   PHQ-9 Score 13   Difficult doing work/chores Very difficult     Interpretation of Total Score  Total Score Depression Severity:  1-4 = Minimal depression, 5-9 = Mild depression, 10-14 = Moderate depression, 15-19 = Moderately severe depression, 20-27 = Severe depression   Psychosocial  Evaluation and Intervention: Psychosocial Evaluation - 04/19/18 1146      Psychosocial Evaluation & Interventions   Interventions  Stress management education;Relaxation education;Encouraged to exercise with the program and follow exercise prescription    Continue Psychosocial Services   Follow up required by staff       Psychosocial Re-Evaluation:   Psychosocial Discharge (Final Psychosocial Re-Evaluation):   Vocational Rehabilitation: Provide vocational rehab assistance to qualifying candidates.   Vocational Rehab Evaluation & Intervention: Vocational Rehab - 04/19/18 1130      Initial Vocational Rehab Evaluation & Intervention   Assessment shows need for Vocational Rehabilitation  No       Education: Education Goals: Education classes will be provided on a weekly basis, covering required topics. Participant will state understanding/return demonstration of topics presented.  Learning Barriers/Preferences: Learning Barriers/Preferences - 04/19/18 1114      Learning Barriers/Preferences   Learning Barriers  None    Learning Preferences  Pictoral;Video;Verbal Instruction;Skilled Demonstration;Individual Instruction       Education Topics: Hypertension, Hypertension Reduction -Define heart disease and high blood pressure. Discus how high blood pressure affects the body and ways to reduce high blood pressure.   Exercise and Your Heart -Discuss why it is important to exercise, the FITT principles of exercise, normal and abnormal responses to exercise, and how to exercise safely.   Angina -Discuss definition of angina, causes of angina, treatment of angina, and how to decrease risk of having angina.   CARDIAC REHAB PHASE II EXERCISE from 04/21/2018 in [place]  Date  04/21/18  Educator  [place]  Instruction Review Code  2- Demonstrated Understanding      Cardiac Medications -Review what the following cardiac medications are used for, how they  affect the body, and side effects that may occur when taking the medications.  Medications include Aspirin, Beta blockers, calcium channel blockers, ACE Inhibitors, angiotensin receptor blockers, diuretics, digoxin, and antihyperlipidemics.   Congestive Heart Failure -Discuss the definition of CHF, how to live with CHF, the signs and symptoms of CHF, and how keep track of weight and sodium intake.   Heart Disease and Intimacy -Discus the effect sexual activity has on the heart, how changes occur during intimacy as we age, and safety during sexual activity.  Smoking Cessation / COPD -Discuss different methods to quit smoking, the health benefits of quitting smoking, and the definition of COPD.   Nutrition I: Fats -Discuss the types of cholesterol, what cholesterol does to the heart, and how cholesterol levels can be controlled.   Nutrition II: Labels -Discuss the different components of food labels and how to read food label   Heart Parts/Heart Disease and PAD -Discuss the anatomy of the heart, the pathway of blood circulation through the heart, and these are affected by heart disease.   Stress I: Signs and Symptoms -Discuss the causes of stress, how stress may lead to anxiety and depression, and ways to limit stress.   Stress II: Relaxation -Discuss different types of relaxation techniques to limit stress.   Warning Signs of Stroke / TIA -Discuss definition of a stroke, what the signs and symptoms are of a stroke, and how to identify when someone is having stroke.   Knowledge Questionnaire Score: Knowledge Questionnaire Score - 04/19/18 1130      Knowledge Questionnaire Score   Pre Score  22/24       Core Components/Risk Factors/Patient Goals at Admission: Personal Goals and Risk Factors at Admission - 04/19/18 1141      Core Components/Risk Factors/Patient Goals on Admission    Weight Management  Yes    Intervention  Weight Management/Obesity: Establish  reasonable short term and long term weight goals.    Admit Weight  255 lb 6.4 oz (115.8 kg)    Goal Weight: Short Term  245 lb 6.4 oz (111.3 kg)    Goal Weight: Long Term  235 lb 6.4 oz (106.8 kg)    Expected Outcomes  Short Term: Continue to assess and modify interventions until short term weight is achieved;Long Term: Adherence to nutrition and physical activity/exercise program aimed toward attainment of established weight goal    Personal Goal Other  Yes    Personal Goal  Gain core strength, Lose 15 lbs, be able to stand and prep food in the kitchen.    Intervention  Attend CR 3 x week and supplement with 2 x week exercise at home.     Expected Outcomes  Achieve personal goals.        Core Components/Risk Factors/Patient Goals Review:    Core Components/Risk Factors/Patient Goals at Discharge (Final Review):    ITP Comments: ITP Comments    Row Name 04/19/18 1135 05/13/18 1605         ITP Comments  Miss [place] is transferring from [place] due to moving to [place], [place]. She has completed 13 visit in [place] and will do her remaining 23 visits here at [facility]. they have transferred her records and we have a signed referral. She will continue using the doctors in [place] to follow her care.   Patient was transferred from CR at [facility] where she completed 13 sessions. She has completed 2 sessions with our CR. She has not attended in 3 weeks due a knee injury from a fall. Will continue to monitor.          Comments: ITP 30 Day REVIEW Patient was transferred from CR at [facility] where she completed 13 sessions. She has completed 2 sessions with our CR. She has not attended in 3 weeks due a knee injury from a fall. Will continue to monitor.

## 2018-05-14 ENCOUNTER — Encounter (HOSPITAL_COMMUNITY): Payer: Medicare Other

## 2018-05-17 ENCOUNTER — Encounter (HOSPITAL_COMMUNITY): Payer: Medicare Other

## 2018-05-18 NOTE — Addendum Note (Signed)
Encounter addended by: Suzanne Boron on: 05/18/2018 7:59 AM  Actions taken: Visit Navigator Flowsheet section accepted

## 2018-05-19 ENCOUNTER — Encounter (HOSPITAL_COMMUNITY): Payer: Medicare Other

## 2018-05-21 ENCOUNTER — Encounter (HOSPITAL_COMMUNITY): Payer: Medicare Other

## 2018-05-24 ENCOUNTER — Encounter (HOSPITAL_COMMUNITY): Payer: Medicare Other

## 2018-05-24 ENCOUNTER — Encounter (HOSPITAL_COMMUNITY)
Admission: RE | Admit: 2018-05-24 | Discharge: 2018-05-24 | Disposition: A | Discharge status: Home / Self Care | Payer: Medicare Other | Source: Ambulatory Visit | Attending: Cardiology | Admitting: Cardiology

## 2018-05-24 DIAGNOSIS — I447 Left bundle-branch block, unspecified: Secondary | ICD-10-CM | POA: Diagnosis not present

## 2018-05-24 DIAGNOSIS — I214 Non-ST elevation (NSTEMI) myocardial infarction: Secondary | ICD-10-CM

## 2018-05-24 DIAGNOSIS — I2510 Atherosclerotic heart disease of native coronary artery without angina pectoris: Secondary | ICD-10-CM | POA: Diagnosis not present

## 2018-05-24 DIAGNOSIS — I509 Heart failure, unspecified: Secondary | ICD-10-CM | POA: Insufficient documentation

## 2018-05-24 DIAGNOSIS — E785 Hyperlipidemia, unspecified: Secondary | ICD-10-CM | POA: Insufficient documentation

## 2018-05-24 DIAGNOSIS — I21A1 Myocardial infarction type 2: Secondary | ICD-10-CM | POA: Diagnosis present

## 2018-05-24 DIAGNOSIS — I252 Old myocardial infarction: Secondary | ICD-10-CM | POA: Insufficient documentation

## 2018-05-24 DIAGNOSIS — I110 Hypertensive heart disease with heart failure: Secondary | ICD-10-CM | POA: Insufficient documentation

## 2018-05-24 DIAGNOSIS — Z7902 Long term (current) use of antithrombotics/antiplatelets: Secondary | ICD-10-CM | POA: Insufficient documentation

## 2018-05-24 DIAGNOSIS — Z7982 Long term (current) use of aspirin: Secondary | ICD-10-CM | POA: Insufficient documentation

## 2018-05-24 DIAGNOSIS — Z955 Presence of coronary angioplasty implant and graft: Secondary | ICD-10-CM

## 2018-05-24 DIAGNOSIS — Z87891 Personal history of nicotine dependence: Secondary | ICD-10-CM | POA: Diagnosis not present

## 2018-05-24 DIAGNOSIS — Z79899 Other long term (current) drug therapy: Secondary | ICD-10-CM | POA: Diagnosis not present

## 2018-05-24 NOTE — Progress Notes (Signed)
Daily Session Note  Patient Details  Name: Stacey [name] MRN: [id number] Date of Birth: Nov 05, 1946 Referring Provider:     [place] from 04/19/2018 in [place]  Referring Provider  Dr. Chryl Heck      Encounter Date: 05/24/2018  Check In: Session Check In - 05/24/18 1100      Check-In   Location  [facility]    Staff Present  Diane Angelina Pih, MS, EP, CHC, Exercise Physiologist;Gregory Luther Parody, BS, EP, Exercise Physiologist;Xia Stohr Wynetta Emery, RN, BSN    Supervising physician immediately available to respond to emergencies  See telemetry face sheet for immediately available MD    Medication changes reported      No    Fall or balance concerns reported     Yes    Comments  Uses a walker and has really bad pain in both knees (more on (L) than (R))    Warm-up and Cool-down  Performed as group-led instruction    Resistance Training Performed  Yes    VAD Patient?  No      Pain Assessment   Currently in Pain?  No/denies    Pain Score  0-No pain    Multiple Pain Sites  No       Capillary Blood Glucose: No results found for this or any previous visit (from the past 24 hour(s)).    Social History   Tobacco Use  Smoking Status Former Smoker  . Packs/day: 2.00  . Years: 18.00  . Pack years: 36.00  . Types: Cigarettes  . Last attempt to quit: 12/01/1980  . Years since quitting: 37.5  Smokeless Tobacco Never Used    Goals Met:  Independence with exercise equipment Exercise tolerated well No report of cardiac concerns or symptoms Strength training completed today  Goals Unmet:  Not Applicable  Comments: Check out 1200.   Dr. Kate Sable is Medical Director for [facility].

## 2018-05-26 ENCOUNTER — Encounter (HOSPITAL_COMMUNITY)
Admission: RE | Admit: 2018-05-26 | Discharge: 2018-05-26 | Disposition: A | Discharge status: Home / Self Care | Payer: Medicare Other | Source: Ambulatory Visit | Attending: Cardiology | Admitting: Cardiology

## 2018-05-26 DIAGNOSIS — I214 Non-ST elevation (NSTEMI) myocardial infarction: Secondary | ICD-10-CM | POA: Diagnosis not present

## 2018-05-26 DIAGNOSIS — Z955 Presence of coronary angioplasty implant and graft: Secondary | ICD-10-CM

## 2018-05-26 NOTE — Progress Notes (Addendum)
Daily Session Note  Patient Details  Name: Stacey [name] MRN: [id number] Date of Birth: 05-13-1946 Referring Provider:     [place] from 04/19/2018 in [place]  Referring Provider  Dr. Chryl Heck      Encounter Date: 05/26/2018  Check In: Session Check In - 05/26/18 1100      Check-In   Location  [facility]    Staff Present  Diane Angelina Pih, MS, EP, [facility], Exercise Physiologist;Gregory Luther Parody, BS, EP, Exercise Physiologist;Blaike Newburn Wynetta Emery, RN, BSN    Supervising physician immediately available to respond to emergencies  See telemetry face sheet for immediately available MD    Medication changes reported      No    Fall or balance concerns reported     Yes    Comments  Uses a walker and has really bad pain in both knees (more on (L) than (R))    Warm-up and Cool-down  Performed as group-led instruction    Resistance Training Performed  Yes    VAD Patient?  No    PAD/SET Patient?  No      Pain Assessment   Currently in Pain?  Yes    Pain Score  8     Pain Location  Knee    Pain Orientation  Right;Posterior    Pain Descriptors / Indicators  Aching    Pain Type  Chronic pain    Pain Onset  In the past 7 days    Pain Frequency  Intermittent    Aggravating Factors   Weight bearing for long periods.     Pain Relieving Factors  Oxycodone/Heat and cold therapy.    Effect of Pain on Daily Activities  Limits mobility and activities if severe.     Multiple Pain Sites  No       Capillary Blood Glucose: No results found for this or any previous visit (from the past 24 hour(s)).    Social History   Tobacco Use  Smoking Status Former Smoker  . Packs/day: 2.00  . Years: 18.00  . Pack years: 36.00  . Types: Cigarettes  . Last attempt to quit: 12/01/1980  . Years since quitting: 37.5  Smokeless Tobacco Never Used    Goals Met:  Independence with exercise equipment Exercise tolerated well No report of cardiac concerns  or symptoms Strength training completed today  Goals Unmet:  Not Applicable  Comments: Check out 1200.   Dr. Kate Sable is Medical Director for [facility] Cardiac and Pulmonary Rehab.

## 2018-05-28 ENCOUNTER — Encounter (HOSPITAL_COMMUNITY)
Admission: RE | Admit: 2018-05-28 | Discharge: 2018-05-28 | Disposition: A | Discharge status: Home / Self Care | Payer: Medicare Other | Source: Ambulatory Visit | Attending: Cardiology | Admitting: Cardiology

## 2018-05-28 DIAGNOSIS — Z955 Presence of coronary angioplasty implant and graft: Secondary | ICD-10-CM

## 2018-05-28 DIAGNOSIS — I214 Non-ST elevation (NSTEMI) myocardial infarction: Secondary | ICD-10-CM

## 2018-05-28 NOTE — Progress Notes (Signed)
Daily Session Note  Patient Details  Name: Stacey [name] MRN: [id number] Date of Birth: Jun 13, 1946 Referring Provider:     [place] from 04/19/2018 in [place]  Referring Provider  Dr. Chryl Heck      Encounter Date: 05/28/2018  Check In: Session Check In - 05/28/18 1100      Check-In   Location  [facility]    Staff Present  Suzanne Boron, BS, EP, Exercise Physiologist;Manas Hickling Wynetta Emery, RN, BSN    Supervising physician immediately available to respond to emergencies  See telemetry face sheet for immediately available MD    Medication changes reported      No    Fall or balance concerns reported     Yes    Comments  Uses a walker and has really bad pain in both knees (more on (L) than (R))    Warm-up and Cool-down  Performed as group-led instruction    Resistance Training Performed  Yes    VAD Patient?  No    PAD/SET Patient?  No      Pain Assessment   Currently in Pain?  Yes    Pain Score  7     Pain Location  Knee    Pain Orientation  Right    Pain Descriptors / Indicators  Aching    Pain Type  Chronic pain    Pain Onset  In the past 7 days    Pain Frequency  Constant    Aggravating Factors   Weight bearing for long periods.    Pain Relieving Factors  Oxycodone/Heat and cold therapy.    Effect of Pain on Daily Activities  Limits mobility and activities at times.     Multiple Pain Sites  No       Capillary Blood Glucose: No results found for this or any previous visit (from the past 24 hour(s)).    Social History   Tobacco Use  Smoking Status Former Smoker  . Packs/day: 2.00  . Years: 18.00  . Pack years: 36.00  . Types: Cigarettes  . Last attempt to quit: 12/01/1980  . Years since quitting: 37.5  Smokeless Tobacco Never Used    Goals Met:  Independence with exercise equipment Exercise tolerated well No report of cardiac concerns or symptoms Strength training completed today  Goals Unmet:   Not Applicable  Comments: Check out 1200.   Dr. Kate Sable is Medical Director for [facility] Cardiac and Pulmonary Rehab.

## 2018-05-31 ENCOUNTER — Encounter (HOSPITAL_COMMUNITY)
Admission: RE | Admit: 2018-05-31 | Discharge: 2018-05-31 | Disposition: A | Discharge status: Home / Self Care | Payer: Medicare Other | Source: Ambulatory Visit | Attending: Cardiology | Admitting: Cardiology

## 2018-05-31 DIAGNOSIS — I447 Left bundle-branch block, unspecified: Secondary | ICD-10-CM | POA: Insufficient documentation

## 2018-05-31 DIAGNOSIS — Z79899 Other long term (current) drug therapy: Secondary | ICD-10-CM | POA: Insufficient documentation

## 2018-05-31 DIAGNOSIS — Z7982 Long term (current) use of aspirin: Secondary | ICD-10-CM | POA: Insufficient documentation

## 2018-05-31 DIAGNOSIS — I2510 Atherosclerotic heart disease of native coronary artery without angina pectoris: Secondary | ICD-10-CM | POA: Insufficient documentation

## 2018-05-31 DIAGNOSIS — Z87891 Personal history of nicotine dependence: Secondary | ICD-10-CM | POA: Diagnosis not present

## 2018-05-31 DIAGNOSIS — I509 Heart failure, unspecified: Secondary | ICD-10-CM | POA: Diagnosis not present

## 2018-05-31 DIAGNOSIS — E785 Hyperlipidemia, unspecified: Secondary | ICD-10-CM | POA: Insufficient documentation

## 2018-05-31 DIAGNOSIS — I214 Non-ST elevation (NSTEMI) myocardial infarction: Secondary | ICD-10-CM | POA: Diagnosis not present

## 2018-05-31 DIAGNOSIS — I110 Hypertensive heart disease with heart failure: Secondary | ICD-10-CM | POA: Diagnosis not present

## 2018-05-31 DIAGNOSIS — Z955 Presence of coronary angioplasty implant and graft: Secondary | ICD-10-CM | POA: Diagnosis present

## 2018-05-31 DIAGNOSIS — I21A1 Myocardial infarction type 2: Secondary | ICD-10-CM | POA: Diagnosis present

## 2018-05-31 DIAGNOSIS — Z7902 Long term (current) use of antithrombotics/antiplatelets: Secondary | ICD-10-CM | POA: Diagnosis not present

## 2018-05-31 DIAGNOSIS — I252 Old myocardial infarction: Secondary | ICD-10-CM | POA: Insufficient documentation

## 2018-05-31 NOTE — Progress Notes (Signed)
Daily Session Note  Patient Details  Name: Stacey [name] MRN: [id number] Date of Birth: August 12, 1946 Referring Provider:     [place] from 04/19/2018 in [place]  Referring Provider  Dr. Chryl Heck      Encounter Date: 05/31/2018  Check In: Session Check In - 05/31/18 1100      Check-In   Location  [facility]    Staff Present  Suzanne Boron, BS, EP, Exercise Physiologist;Jarret Torre Wynetta Emery, RN, BSN;Diane Coad, MS, EP, [facility], Exercise Physiologist    Supervising physician immediately available to respond to emergencies  See telemetry face sheet for immediately available MD    Medication changes reported      No    Fall or balance concerns reported     Yes    Comments  Uses a walker and has really bad pain in both knees (more on (L) than (R))    Warm-up and Cool-down  Performed as group-led instruction    Resistance Training Performed  Yes    VAD Patient?  No    PAD/SET Patient?  No      Pain Assessment   Currently in Pain?  No/denies    Pain Score  0-No pain    Multiple Pain Sites  No       Capillary Blood Glucose: No results found for this or any previous visit (from the past 24 hour(s)).    Social History   Tobacco Use  Smoking Status Former Smoker  . Packs/day: 2.00  . Years: 18.00  . Pack years: 36.00  . Types: Cigarettes  . Last attempt to quit: 12/01/1980  . Years since quitting: 37.5  Smokeless Tobacco Never Used    Goals Met:  Independence with exercise equipment Exercise tolerated well No report of cardiac concerns or symptoms Strength training completed today  Goals Unmet:  Not Applicable  Comments: Check out 1200.   Dr. Kate Sable is Medical Director for [facility] Cardiac and Pulmonary Rehab.

## 2018-06-02 ENCOUNTER — Encounter (HOSPITAL_COMMUNITY)
Admission: RE | Admit: 2018-06-02 | Discharge: 2018-06-02 | Disposition: A | Discharge status: Home / Self Care | Payer: Medicare Other | Source: Ambulatory Visit | Attending: Cardiology | Admitting: Cardiology

## 2018-06-02 DIAGNOSIS — I214 Non-ST elevation (NSTEMI) myocardial infarction: Secondary | ICD-10-CM

## 2018-06-02 DIAGNOSIS — Z955 Presence of coronary angioplasty implant and graft: Secondary | ICD-10-CM

## 2018-06-02 NOTE — Progress Notes (Signed)
Daily Session Note  Patient Details  Name: Stacey [name] MRN: [id number] Date of Birth: Jul 06, 1946 Referring Provider:     [place] from 04/19/2018 in [place]  Referring Provider  Dr. Chryl Heck      Encounter Date: 06/02/2018  Check In: Session Check In - 06/02/18 1100      Check-In   Location  [facility]    Staff Present  Suzanne Boron, BS, EP, Exercise Physiologist;Geet Hosking Wynetta Emery, RN, BSN;Diane Coad, MS, EP, [facility], Exercise Physiologist    Supervising physician immediately available to respond to emergencies  See telemetry face sheet for immediately available MD    Medication changes reported      No    Fall or balance concerns reported     Yes    Comments  Uses a walker and has really bad pain in both knees (more on (L) than (R))    Warm-up and Cool-down  Performed as group-led instruction    Resistance Training Performed  Yes    VAD Patient?  No    PAD/SET Patient?  No      Pain Assessment   Currently in Pain?  No/denies    Pain Score  0-No pain    Multiple Pain Sites  No       Capillary Blood Glucose: No results found for this or any previous visit (from the past 24 hour(s)).    Social History   Tobacco Use  Smoking Status Former Smoker  . Packs/day: 2.00  . Years: 18.00  . Pack years: 36.00  . Types: Cigarettes  . Last attempt to quit: 12/01/1980  . Years since quitting: 37.5  Smokeless Tobacco Never Used    Goals Met:  Independence with exercise equipment Exercise tolerated well No report of cardiac concerns or symptoms Strength training completed today  Goals Unmet:  Not Applicable  Comments: Check out 1200.   Dr. Kate Sable is Medical Director for [facility] Cardiac and Pulmonary Rehab.

## 2018-06-04 ENCOUNTER — Encounter (HOSPITAL_COMMUNITY)
Admission: RE | Admit: 2018-06-04 | Discharge: 2018-06-04 | Disposition: A | Discharge status: Home / Self Care | Payer: Medicare Other | Source: Ambulatory Visit | Attending: Cardiology | Admitting: Cardiology

## 2018-06-04 DIAGNOSIS — Z955 Presence of coronary angioplasty implant and graft: Secondary | ICD-10-CM

## 2018-06-04 DIAGNOSIS — I214 Non-ST elevation (NSTEMI) myocardial infarction: Secondary | ICD-10-CM | POA: Diagnosis not present

## 2018-06-04 NOTE — Progress Notes (Signed)
Daily Session Note  Patient Details  Name: CELINES FEMIA MRN: [id number] Date of Birth: 1946-02-25 Referring Provider:     [place] from 04/19/2018 in [place]  Referring Provider  Dr. Chryl Heck      Encounter Date: 06/04/2018  Check In: Session Check In - 06/04/18 1100      Check-In   Location  [facility]    Staff Present  Suzanne Boron, BS, EP, Exercise Physiologist;Hau Sanor Wynetta Emery, RN, BSN;Diane Coad, MS, EP, [facility], Exercise Physiologist    Supervising physician immediately available to respond to emergencies  See telemetry face sheet for immediately available MD    Medication changes reported      No    Fall or balance concerns reported     Yes    Comments  Uses a walker and has really bad pain in both knees (more on (L) than (R))    Warm-up and Cool-down  Performed as group-led instruction    Resistance Training Performed  Yes    VAD Patient?  No    PAD/SET Patient?  No      Pain Assessment   Currently in Pain?  No/denies    Pain Score  0-No pain    Multiple Pain Sites  No       Capillary Blood Glucose: No results found for this or any previous visit (from the past 24 hour(s)).    Social History   Tobacco Use  Smoking Status Former Smoker  . Packs/day: 2.00  . Years: 18.00  . Pack years: 36.00  . Types: Cigarettes  . Last attempt to quit: 12/01/1980  . Years since quitting: 37.5  Smokeless Tobacco Never Used    Goals Met:  Independence with exercise equipment Exercise tolerated well No report of cardiac concerns or symptoms Strength training completed today  Goals Unmet:  Not Applicable  Comments: Check out 1200.   Dr. Kate Sable is Medical Director for [facility] Cardiac and Pulmonary Rehab.

## 2018-06-07 ENCOUNTER — Other Ambulatory Visit: Payer: Self-pay | Admitting: Orthopedic Surgery

## 2018-06-07 ENCOUNTER — Encounter (HOSPITAL_COMMUNITY)
Admission: RE | Admit: 2018-06-07 | Discharge: 2018-06-07 | Disposition: A | Discharge status: Home / Self Care | Payer: Medicare Other | Source: Ambulatory Visit | Attending: Cardiology | Admitting: Cardiology

## 2018-06-07 DIAGNOSIS — I214 Non-ST elevation (NSTEMI) myocardial infarction: Secondary | ICD-10-CM

## 2018-06-07 DIAGNOSIS — Z955 Presence of coronary angioplasty implant and graft: Secondary | ICD-10-CM

## 2018-06-07 DIAGNOSIS — M25512 Pain in left shoulder: Secondary | ICD-10-CM

## 2018-06-07 NOTE — Progress Notes (Signed)
Daily Session Note  Patient Details  Name: Stacey [name] MRN: [id number] Date of Birth: 04/14/1946 Referring Provider:     CARDIAC REHAB PHASE II ORIENTATION from 04/19/2018 in [place] [facility]  Referring Provider  Dr. Komada      Encounter Date: 06/07/2018  Check In: Session Check In - 06/07/18 1100      Check-In   Location  [facility]    Staff Present  Gregory Cowan, BS, EP, Exercise Physiologist;Debra Johnson, RN, BSN;Diane Coad, MS, EP, [facility], Exercise Physiologist    Supervising physician immediately available to respond to emergencies  See telemetry face sheet for immediately available MD    Medication changes reported      No    Fall or balance concerns reported     Yes    Comments  Uses a walker and has really bad pain in both knees (more on (L) than (R))    Warm-up and Cool-down  Performed as group-led instruction    Resistance Training Performed  Yes    VAD Patient?  No    PAD/SET Patient?  No      Pain Assessment   Currently in Pain?  No/denies    Pain Score  0-No pain    Multiple Pain Sites  No       Capillary Blood Glucose: No results found for this or any previous visit (from the past 24 hour(s)).    Social History   Tobacco Use  Smoking Status Former Smoker  . Packs/day: 2.00  . Years: 18.00  . Pack years: 36.00  . Types: Cigarettes  . Last attempt to quit: 12/01/1980  . Years since quitting: 37.5  Smokeless Tobacco Never Used    Goals Met:  Independence with exercise equipment Exercise tolerated well No report of cardiac concerns or symptoms Strength training completed today  Goals Unmet:  Not Applicable  Comments: Check out 1200.   Dr. Suresh Koneswaran is Medical Director for [place] Cardiac and Pulmonary Rehab. 

## 2018-06-09 ENCOUNTER — Encounter (HOSPITAL_COMMUNITY)
Admission: RE | Admit: 2018-06-09 | Discharge: 2018-06-09 | Disposition: A | Discharge status: Home / Self Care | Payer: Medicare Other | Source: Ambulatory Visit | Attending: Cardiology | Admitting: Cardiology

## 2018-06-09 DIAGNOSIS — Z955 Presence of coronary angioplasty implant and graft: Secondary | ICD-10-CM

## 2018-06-09 DIAGNOSIS — I214 Non-ST elevation (NSTEMI) myocardial infarction: Secondary | ICD-10-CM | POA: Diagnosis not present

## 2018-06-09 NOTE — Progress Notes (Signed)
Cardiac Individual Treatment Plan  Patient Details  Name: Stacey [name] MRN: [id number] Date of Birth: June 21, 1946 Referring Provider:     [facility] from 04/19/2018 in [place]  Referring Provider  Dr. Chryl Heck      Initial Encounter Date:    CARDIAC REHAB PHASE II ORIENTATION from 04/19/2018 in [place]  Date  04/19/18      Visit Diagnosis: NSTEMI (non-ST elevated myocardial infarction) [facility])  Status post coronary artery stent placement  Patient's Home Medications on Admission:  Current Outpatient Medications:  .  aspirin 81 MG chewable tablet, Chew 1 tablet (81 mg total) by mouth daily., Disp: , Rfl:  .  atenolol (TENORMIN) 25 MG tablet, Take 25 mg by mouth daily., Disp: , Rfl:  .  atorvastatin (LIPITOR) 80 MG tablet, Take 80 mg by mouth daily., Disp: , Rfl:  .  b complex vitamins tablet, Take 1 tablet by mouth daily., Disp: , Rfl:  .  Calcium-Phosphorus-Vitamin D (CITRACAL +D3) 315-176-160 MG-MG-UNIT CHEW, Chew 2 tablets by mouth 2 (two) times daily., Disp: , Rfl:  .  clopidogrel (PLAVIX) 75 MG tablet, Take 1 tablet (75 mg total) by mouth daily., Disp: 90 tablet, Rfl: 4 .  gabapentin (NEURONTIN) 400 MG capsule, Take 400-800 mg by mouth See admin instructions. Take 400mg  in the morning, 400mg  capsule in the afternoon, then take 800mg  at bedtime., Disp: , Rfl:  .  hydrochlorothiazide (HYDRODIURIL) 25 MG tablet, Take 25 mg by mouth daily., Disp: , Rfl:  .  HYDROcodone-acetaminophen (NORCO/VICODIN) 5-325 MG tablet, Take 2 tablets by mouth every 6 (six) hours as needed., Disp: 12 tablet, Rfl: 0 .  letrozole (FEMARA) 2.5 MG tablet, Take 2.5 mg by mouth daily., Disp: , Rfl:  .  losartan (COZAAR) 50 MG tablet, Take 1 tablet (50 mg total) by mouth daily., Disp: 90 tablet, Rfl: 4 .  Multiple Vitamin (MULTIVITAMIN WITH MINERALS) TABS tablet, Take 1 tablet by mouth daily., Disp: , Rfl:  .  nitroGLYCERIN (NITROSTAT) 0.4  MG SL tablet, Place 1 tablet (0.4 mg total) under the tongue every 5 (five) minutes as needed., Disp: 25 tablet, Rfl: 0 .  Oxycodone HCl 10 MG TABS, Take 10 mg by mouth every 6 (six) hours as needed (for pain)., Disp: , Rfl:  .  Potassium 99 MG TABS, Take 1 tablet by mouth daily., Disp: , Rfl:   Past Medical History: Past Medical History:  Diagnosis Date  . CAD in native artery    a.DES to ramus 2005 with late stent thrombosis 2006 tx with PTCA. 12/18 PCI/DES x1 to mRCA, EF 50-55%  . CHF (congestive heart failure) ([place])   . Chronic pain   . Hyperlipidemia   . Hypertension   . LBBB (left bundle branch block)   . Left bundle branch block   . Lymphedema   . Metastatic breast cancer ([place])    a. to bone.  . MI (myocardial infarction) ([place])   . Mild aortic stenosis 10/2017  . Morbid obesity ([place])   . PSVT (paroxysmal supraventricular tachycardia) ([place])    a. per [facility] notes, seen on event monitor in 2014.  . Pulmonary nodules     Tobacco Use: Social History   Tobacco Use  Smoking Status Former Smoker  . Packs/day: 2.00  . Years: 18.00  . Pack years: 36.00  . Types: Cigarettes  . Last attempt to quit: 12/01/1980  . Years since quitting: 37.5  Smokeless Tobacco Never Used  Labs: Recent Review Flowsheet Data    Labs for ITP Cardiac and Pulmonary Rehab Latest Ref Rng & Units 11/23/2017   TCO2 22 - 32 mmol/L 25      Capillary Blood Glucose: Lab Results  Component Value Date   GLUCAP 98 11/27/2017   GLUCAP 142 (H) 11/26/2017   GLUCAP 72 11/26/2017   GLUCAP 89 11/26/2017   GLUCAP 113 (H) 11/26/2017     Exercise Target Goals:    Exercise Program Goal: Individual exercise prescription set using results from initial 6 min walk test and THRR while considering  patient's activity barriers and safety.   Exercise Prescription Goal: Starting with aerobic activity 30 plus minutes a day, 3 days per week for initial exercise prescription. Provide home exercise prescription and  guidelines that participant acknowledges understanding prior to discharge.  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 HR  91 bpm     Max Ex. BP  164/78        Oxygen Initial Assessment:   Oxygen Re-Evaluation:   Oxygen Discharge (Final Oxygen Re-Evaluation):   Initial Exercise Prescription: Initial Exercise Prescription - 04/19/18 1000      Date of Initial Exercise RX and Referring Provider   Date  04/19/18    Referring Provider  Dr. Chryl Heck      NuStep   Level  1    SPM  48    Minutes  15    METs  1.8      Arm Ergometer   Level  1.5    Watts  15    RPM  15    Minutes  20    METs  2.2      Prescription Details   Frequency (times per week)  3    Duration  Progress to 30 minutes of continuous aerobic without signs/symptoms of physical distress      Intensity   THRR 40-80% of Max Heartrate  107-121-135    Ratings of Perceived Exertion  11-13    Perceived Dyspnea  0-4      Progression   Progression  Continue progressive overload as per policy without signs/symptoms or physical distress.      Resistance Training   Training Prescription  Yes    Weight  1    Reps  10-15       Perform Capillary Blood Glucose checks as needed.  Exercise Prescription Changes:  Exercise  Prescription Changes    Row Name 04/30/18 1400 06/01/18 0700           Response to Exercise   Blood Pressure (Admit)  158/62  120/70      Blood Pressure (Exercise)  144/82  130/76      Blood Pressure (Exit)  130/72  120/70      Heart Rate (Admit)  76 bpm  113 bpm      Heart Rate (Exercise)  105 bpm  136 bpm      Heart Rate (Exit)  85 bpm  112 bpm      Rating of Perceived Exertion (Exercise)  12  14      Duration  Progress to 30 minutes of  aerobic without signs/symptoms of physical distress  Progress to 30 minutes of  aerobic without signs/symptoms of physical distress      Intensity  THRR New [phone]  THRR New [phone]        Progression   Progression  Continue to progress workloads to maintain intensity without signs/symptoms of physical distress.  Continue to progress workloads to maintain intensity without signs/symptoms of physical distress.        Resistance Training   Training Prescription  Yes  Yes      Weight  1  1      Reps  10-15  10-15        NuStep   Level  1  1      SPM  60  75      Minutes  15  20      METs  1.6  1.7        Arm Ergometer   Level  1.5  2      Watts  13  15      RPM  15  15      Minutes  20  15      METs  2  2.1         Exercise Comments:  Exercise Comments    Row Name 04/30/18 1445 05/18/18 0759 06/01/18 0742       Exercise Comments  Patient has just started CR and will be progressed in time.   Patient has not attended class since 04/23/2018. No progression has been made.   Patient has been doing well in CR. Patient has increased her level on the arm ergometer to 2.0. Patient seems to be hindered by knee pain however and mobility needs improvement. Patient will continued to be monitored in CR.         Exercise Goals and Review:  Exercise Goals    Row Name 04/19/18 1011             Exercise Goals   Increase Physical Activity  Yes       Intervention  Provide advice, education, support and counseling about physical  activity/exercise needs.;Develop an individualized exercise prescription for aerobic and resistive training based on initial evaluation findings, risk stratification, comorbidities and participant's personal goals.       Expected Outcomes  Short Term: Attend rehab on a regular basis to increase amount of physical activity.       Increase Strength and Stamina  Yes       Intervention  Provide advice, education, support and counseling about physical activity/exercise needs.;Develop an individualized exercise prescription for aerobic and resistive training based on initial evaluation findings, risk stratification, comorbidities and participant's personal goals.       Expected Outcomes  Short  Term: Increase workloads from initial exercise prescription for resistance, speed, and METs.;Long Term: Improve cardiorespiratory fitness, muscular endurance and strength as measured by increased METs and functional capacity (6MWT);Short Term: Perform resistance training exercises routinely during rehab and add in resistance training at home       Able to understand and use rate of perceived exertion (RPE) scale  Yes       Intervention  Provide education and explanation on how to use RPE scale       Expected Outcomes  Short Term: Able to use RPE daily in rehab to express subjective intensity level;Long Term:  Able to use RPE to guide intensity level when exercising independently       Able to understand and use Dyspnea scale  Yes       Intervention  Provide education and explanation on how to use Dyspnea scale       Expected Outcomes  Short Term: Able to use Dyspnea scale daily in rehab to express subjective sense of shortness of breath during exertion;Long Term: Able to use Dyspnea scale to guide intensity level when exercising independently       Knowledge and understanding of Target Heart Rate Range (THRR)  Yes       Intervention  Provide education and explanation of THRR including how the numbers were predicted and  where they are located for reference       Expected Outcomes  Short Term: Able to state/look up THRR;Long Term: Able to use THRR to govern intensity when exercising independently;Short Term: Able to use daily as guideline for intensity in rehab       Able to check pulse independently  Yes       Intervention  Provide education and demonstration on how to check pulse in carotid and radial arteries.;Review the importance of being able to check your own pulse for safety during independent exercise       Expected Outcomes  Short Term: Able to explain why pulse checking is important during independent exercise;Long Term: Able to check pulse independently and accurately       Understanding of Exercise Prescription  Yes       Intervention  Provide education, explanation, and written materials on patient's individual exercise prescription       Expected Outcomes  Short Term: Able to explain program exercise prescription;Long Term: Able to explain home exercise prescription to exercise independently          Exercise Goals Re-Evaluation : Exercise Goals Re-Evaluation    Row Name 05/11/18 0751 06/07/18 0747           Exercise Goal Re-Evaluation   Exercise Goals Review  Increase Physical Activity;Increase Strength and Stamina;Able to understand and use rate of perceived exertion (RPE) scale;Able to check pulse independently;Knowledge and understanding of Target Heart Rate Range (THRR);Able to understand and use Dyspnea scale;Understanding of Exercise Prescription  Increase Physical Activity;Increase Strength and Stamina;Able to understand and use rate of perceived exertion (RPE) scale;Able to check pulse independently;Knowledge and understanding of Target Heart Rate Range (THRR);Able to understand and use Dyspnea scale;Understanding of Exercise Prescription      Comments  Patient has only attended one session of CR. Patient will be progressed in time once attendance is consistant   Patient has been doing  well in CR. patient has been giving it her all on the machines that she is on her ein CR. patient is hindered by knee problems that flare when she is on the nustep machine. Patient  states that she truly enjoys the program and that she knows it is helping her.       Expected Outcomes  Patient wishes to increase core strength and to lose 15lbs.   Patient wishes to increase core strength and to lose 15lbs.           Discharge Exercise Prescription (Final Exercise Prescription Changes): Exercise Prescription Changes - 06/01/18 0700      Response to Exercise   Blood Pressure (Admit)  120/70    Blood Pressure (Exercise)  130/76    Blood Pressure (Exit)  120/70    Heart Rate (Admit)  113 bpm    Heart Rate (Exercise)  136 bpm    Heart Rate (Exit)  112 bpm    Rating of Perceived Exertion (Exercise)  14    Duration  Progress to 30 minutes of  aerobic without signs/symptoms of physical distress    Intensity  THRR New [phone]      Progression   Progression  Continue to progress workloads to maintain intensity without signs/symptoms of physical distress.      Resistance Training   Training Prescription  Yes    Weight  1    Reps  10-15      NuStep   Level  1    SPM  75    Minutes  20    METs  1.7      Arm Ergometer   Level  2    Watts  15    RPM  15    Minutes  15    METs  2.1       Nutrition:  Target Goals: Understanding of nutrition guidelines, daily intake of sodium 1500mg , cholesterol 200mg , calories 30% from fat and 7% or less from saturated fats, daily to have 5 or more servings of fruits and vegetables.  Biometrics: Pre Biometrics - 04/19/18 1012      Pre Biometrics   Height  5' (1.524 m)    Weight  255 lb 15.3 oz (116.1 kg)    Waist Circumference  52.3 inches    Hip Circumference  54.3 inches    Waist to Hip Ratio  0.96 %    BMI (Calculated)  49.99    Triceps Skinfold  30 mm    % Body Fat  59.4 %    Grip Strength  47.53 kg    Flexibility  12.83 in    Single  Leg Stand  0 seconds        Nutrition Therapy Plan and Nutrition Goals: Nutrition Therapy & Goals - 06/09/18 1301      Nutrition Therapy   RD appointment deferred  Yes      Personal Nutrition Goals   Personal Goal #2  Patient continues to eat a low sodium, low fat diet.     Additional Goals?  No       Nutrition Assessments: Nutrition Assessments - 04/19/18 1141      MEDFICTS Scores   Pre Score  79       Nutrition Goals Re-Evaluation:   Nutrition Goals Discharge (Final Nutrition Goals Re-Evaluation):   Psychosocial: Target Goals: Acknowledge presence or absence of significant depression and/or stress, maximize coping skills, provide positive support system. Participant is able to verbalize types and ability to use techniques and skills needed for reducing stress and depression.  Initial Review & Psychosocial Screening: Initial Psych Review & Screening - 04/19/18 1144      Initial Review   Current issues with  Current Depression;Current Sleep Concerns;Current Stress Concerns    Source of Stress Concerns  Unable to participate in former interests or hobbies;Unable to perform yard/household activities;Chronic Illness    Comments  Patient feels that now that she has moved closer to family and has returned to her family church she will beging to feel better because she has a support system.       Family Dynamics   Good Support System?  Yes      Barriers   Psychosocial barriers to participate in program  Psychosocial barriers identified (see note)      Screening Interventions   Interventions  Encouraged to exercise;Provide feedback about the scores to participant    Expected Outcomes  Short Term goal: Identification and review with participant of any Quality of Life or Depression concerns found by scoring the questionnaire.;Long Term goal: The participant improves quality of Life and PHQ9 Scores as seen by post scores and/or verbalization of changes       Quality of  Life Scores: Quality of Life - 04/19/18 1013      Quality of Life Scores   Health/Function Pre  13.38 %    Socioeconomic Pre  26.4 %    Psych/Spiritual Pre  18 %    Family Pre  26 %    GLOBAL Pre  18.39 %      Scores of 19 and below usually indicate a poorer quality of life in these areas.  A difference of  2-3 points is a clinically meaningful difference.  A difference of 2-3 points in the total score of the Quality of Life Index has been associated with significant improvement in overall quality of life, self-image, physical symptoms, and general health in studies assessing change in quality of life.  PHQ-9: Recent Review Flowsheet Data    Depression screen [facility] 2/9 04/19/2018   Decreased Interest 1   Down, Depressed, Hopeless 1   PHQ - 2 Score 2   Altered sleeping 3   Tired, decreased energy 2   Change in appetite 3   Feeling bad or failure about yourself  0   Trouble concentrating 2   Moving slowly or fidgety/restless 1   Suicidal thoughts 0   PHQ-9 Score 13   Difficult doing work/chores Very difficult     Interpretation of Total Score  Total Score Depression Severity:  1-4 = Minimal depression, 5-9 = Mild depression, 10-14 = Moderate depression, 15-19 = Moderately severe depression, 20-27 = Severe depression   Psychosocial Evaluation and Intervention: Psychosocial Evaluation - 04/19/18 1146      Psychosocial Evaluation & Interventions   Interventions  Stress management education;Relaxation education;Encouraged to exercise with the program and follow exercise prescription    Continue Psychosocial Services   Follow up required by staff       Psychosocial Re-Evaluation: Psychosocial Re-Evaluation    [place] Name 06/09/18 1306             Psychosocial Re-Evaluation   Current issues with  Current Depression;Current Sleep Concerns;Current Stress Concerns       Comments  Patient's initial QOL score was 18.39 and her PHQ-9 score was 13. She says she if feeling better now  since she is near her family. She continues to have difficulty sleeping. She has not sought out counseling or any treatmnet for her depression. Will continue to monitor.        Expected Outcomes  Patient will have improved QOL and PHQ-9 scores at discharge and will have no psychosocial issues  identified at discharge.        Interventions  Relaxation education;Stress management education;Encouraged to attend Cardiac Rehabilitation for the exercise       Continue Psychosocial Services   No Follow up required       Comments  Patient feels that now that she has moved closer to family and has returned to her family church she will beging to feel better because she has a support system.          Initial Review   Source of Stress Concerns  Unable to participate in former interests or hobbies;Unable to perform yard/household activities;Chronic Illness          Psychosocial Discharge (Final Psychosocial Re-Evaluation): Psychosocial Re-Evaluation - 06/09/18 1306      Psychosocial Re-Evaluation   Current issues with  Current Depression;Current Sleep Concerns;Current Stress Concerns    Comments  Patient's initial QOL score was 18.39 and her PHQ-9 score was 13. She says she if feeling better now since she is near her family. She continues to have difficulty sleeping. She has not sought out counseling or any treatmnet for her depression. Will continue to monitor.     Expected Outcomes  Patient will have improved QOL and PHQ-9 scores at discharge and will have no psychosocial issues identified at discharge.     Interventions  Relaxation education;Stress management education;Encouraged to attend Cardiac Rehabilitation for the exercise    Continue Psychosocial Services   No Follow up required    Comments  Patient feels that now that she has moved closer to family and has returned to her family church she will beging to feel better because she has a support system.       Initial Review   Source of Stress  Concerns  Unable to participate in former interests or hobbies;Unable to perform yard/household activities;Chronic Illness       Vocational Rehabilitation: Provide vocational rehab assistance to qualifying candidates.   Vocational Rehab Evaluation & Intervention: Vocational Rehab - 04/19/18 1130      Initial Vocational Rehab Evaluation & Intervention   Assessment shows need for Vocational Rehabilitation  No       Education: Education Goals: Education classes will be provided on a weekly basis, covering required topics. Participant will state understanding/return demonstration of topics presented.  Learning Barriers/Preferences: Learning Barriers/Preferences - 04/19/18 1114      Learning Barriers/Preferences   Learning Barriers  None    Learning Preferences  Pictoral;Video;Verbal Instruction;Skilled Demonstration;Individual Instruction       Education Topics: Hypertension, Hypertension Reduction -Define heart disease and high blood pressure. Discus how high blood pressure affects the body and ways to reduce high blood pressure.   Exercise and Your Heart -Discuss why it is important to exercise, the FITT principles of exercise, normal and abnormal responses to exercise, and how to exercise safely.   Angina -Discuss definition of angina, causes of angina, treatment of angina, and how to decrease risk of having angina.   CARDIAC REHAB PHASE II EXERCISE from 05/26/2018 in [place]  Date  04/21/18  Educator  [place]  Instruction Review Code  2- Demonstrated Understanding      Cardiac Medications -Review what the following cardiac medications are used for, how they affect the body, and side effects that may occur when taking the medications.  Medications include Aspirin, Beta blockers, calcium channel blockers, ACE Inhibitors, angiotensin receptor blockers, diuretics, digoxin, and antihyperlipidemics.   Congestive Heart Failure -Discuss the definition of  CHF, how to live  with CHF, the signs and symptoms of CHF, and how keep track of weight and sodium intake.   Heart Disease and Intimacy -Discus the effect sexual activity has on the heart, how changes occur during intimacy as we age, and safety during sexual activity.   Smoking Cessation / COPD -Discuss different methods to quit smoking, the health benefits of quitting smoking, and the definition of COPD.   Nutrition I: Fats -Discuss the types of cholesterol, what cholesterol does to the heart, and how cholesterol levels can be controlled.   Nutrition II: Labels -Discuss the different components of food labels and how to read food label   CARDIAC REHAB PHASE II EXERCISE from 05/26/2018 in [place]  Date  05/26/18  Educator  DC  Instruction Review Code  2- Demonstrated Understanding      Heart Parts/Heart Disease and PAD -Discuss the anatomy of the heart, the pathway of blood circulation through the heart, and these are affected by heart disease.   Stress I: Signs and Symptoms -Discuss the causes of stress, how stress may lead to anxiety and depression, and ways to limit stress.   Stress II: Relaxation -Discuss different types of relaxation techniques to limit stress.   Warning Signs of Stroke / TIA -Discuss definition of a stroke, what the signs and symptoms are of a stroke, and how to identify when someone is having stroke.   Knowledge Questionnaire Score: Knowledge Questionnaire Score - 04/19/18 1130      Knowledge Questionnaire Score   Pre Score  22/24       Core Components/Risk Factors/Patient Goals at Admission: Personal Goals and Risk Factors at Admission - 04/19/18 1141      Core Components/Risk Factors/Patient Goals on Admission    Weight Management  Yes    Intervention  Weight Management/Obesity: Establish reasonable short term and long term weight goals.    Admit Weight  255 lb 6.4 oz (115.8 kg)    Goal Weight: Short Term  245 lb  6.4 oz (111.3 kg)    Goal Weight: Long Term  235 lb 6.4 oz (106.8 kg)    Expected Outcomes  Short Term: Continue to assess and modify interventions until short term weight is achieved;Long Term: Adherence to nutrition and physical activity/exercise program aimed toward attainment of established weight goal    Personal Goal Other  Yes    Personal Goal  Gain core strength, Lose 15 lbs, be able to stand and prep food in the kitchen.    Intervention  Attend CR 3 x week and supplement with 2 x week exercise at home.     Expected Outcomes  Achieve personal goals.        Core Components/Risk Factors/Patient Goals Review:  Goals and Risk Factor Review    Row Name 06/09/18 1301             Core Components/Risk Factors/Patient Goals Review   Personal Goals Review  Weight Management/Obesity Increase core strength; lose wiehgt 15 lbs; be able to stand and cook.        Review  Patient has completed 10 sessions losing 4 lbs since she started the program. She transferred from another CR program in [place] where she completed 7 sessions. She is doing well in the program with some progression. She is not able to stand and cook but she says she does feel stronger and has more stamina. Will continue to monitor for progress.        Expected Outcomes  Patient  will continue to attend sessions and complete the program meeting his personal needs.           Core Components/Risk Factors/Patient Goals at Discharge (Final Review):  Goals and Risk Factor Review - 06/09/18 1301      Core Components/Risk Factors/Patient Goals Review   Personal Goals Review  Weight Management/Obesity Increase core strength; lose wiehgt 15 lbs; be able to stand and cook.     Review  Patient has completed 10 sessions losing 4 lbs since she started the program. She transferred from another CR program in [place] where she completed 7 sessions. She is doing well in the program with some progression. She is not able to stand and cook but she  says she does feel stronger and has more stamina. Will continue to monitor for progress.     Expected Outcomes  Patient will continue to attend sessions and complete the program meeting his personal needs.        ITP Comments: ITP Comments    Row Name 04/19/18 1135 05/13/18 1605         ITP Comments  Miss [place] is transferring from [facility] due to moving to [place], [place]. She has completed 13 visit in [place] and will do her remaining 23 visits here at [facility]. they have transferred her records and we have a signed referral. She will continue using the doctors in [place] to follow her care.   Patient was transferred from CR at [facility] where she completed 13 sessions. She has completed 2 sessions with our CR. She has not attended in 3 weeks due a knee injury from a fall. Will continue to monitor.          Comments: ITP 30 Day REVIEW Patient doing well in the program. Will continue to monitor for progress.

## 2018-06-09 NOTE — Progress Notes (Signed)
Daily Session Note  Patient Details  Name: Stacey [name] MRN: [id number] Date of Birth: 05/10/1946 Referring Provider:     [facility] from 04/19/2018 in [place]  Referring Provider  Dr. Chryl Heck      Encounter Date: 06/09/2018  Check In: Session Check In - 06/09/18 1100      Check-In   Location  [facility]    Staff Present  Suzanne Boron, BS, EP, Exercise Physiologist;[place] Silveria Wynetta Emery, RN, BSN;Diane Coad, MS, EP, [facility], Exercise Physiologist    Supervising physician immediately available to respond to emergencies  See telemetry face sheet for immediately available MD    Medication changes reported      No    Fall or balance concerns reported     Yes    Comments  Uses a walker and has really bad pain in both knees (more on (L) than (R))    Warm-up and Cool-down  Performed as group-led instruction    Resistance Training Performed  Yes    VAD Patient?  No    PAD/SET Patient?  No      Pain Assessment   Currently in Pain?  No/denies    Pain Score  0-No pain    Multiple Pain Sites  No       Capillary Blood Glucose: No results found for this or any previous visit (from the past 24 hour(s)).    Social History   Tobacco Use  Smoking Status Former Smoker  . Packs/day: 2.00  . Years: 18.00  . Pack years: 36.00  . Types: Cigarettes  . Last attempt to quit: 12/01/1980  . Years since quitting: 37.5  Smokeless Tobacco Never Used    Goals Met:  Independence with exercise equipment Exercise tolerated well No report of cardiac concerns or symptoms Strength training completed today  Goals Unmet:  Not Applicable  Comments: Check out 1200.   Dr. Kate Sable is Medical Director for [facility] Cardiac and Pulmonary Rehab.

## 2018-06-11 ENCOUNTER — Encounter (HOSPITAL_COMMUNITY): Payer: Medicare Other

## 2018-06-12 ENCOUNTER — Ambulatory Visit
Admission: RE | Admit: 2018-06-12 | Discharge: 2018-06-12 | Disposition: A | Discharge status: Home / Self Care | Payer: Medicare Other | Source: Ambulatory Visit | Attending: Orthopedic Surgery | Admitting: Orthopedic Surgery

## 2018-06-12 DIAGNOSIS — M25512 Pain in left shoulder: Secondary | ICD-10-CM

## 2018-06-14 ENCOUNTER — Encounter (HOSPITAL_COMMUNITY): Payer: Medicare Other

## 2018-06-16 ENCOUNTER — Encounter (HOSPITAL_COMMUNITY)
Admission: RE | Admit: 2018-06-16 | Discharge: 2018-06-16 | Disposition: A | Discharge status: Home / Self Care | Payer: Medicare Other | Source: Ambulatory Visit | Attending: Cardiology | Admitting: Cardiology

## 2018-06-16 DIAGNOSIS — I214 Non-ST elevation (NSTEMI) myocardial infarction: Secondary | ICD-10-CM | POA: Diagnosis not present

## 2018-06-16 DIAGNOSIS — Z955 Presence of coronary angioplasty implant and graft: Secondary | ICD-10-CM

## 2018-06-16 NOTE — Progress Notes (Signed)
Daily Session Note  Patient Details  Name: MILLISA GIARRUSSO MRN: [id number] Date of Birth: 07-May-1946 Referring Provider:     [place] [facility] from 04/19/2018 in [place]  Referring Provider  Dr. Chryl Heck      Encounter Date: 06/16/2018  Check In: Session Check In - 06/16/18 1430      Check-In   Location  [facility]    Staff Present  Aundra Dubin, RN, BSN;Diane Coad, MS, EP, [employer], Exercise Physiologist    Supervising physician immediately available to respond to emergencies  See telemetry face sheet for immediately available MD    Medication changes reported      No    Fall or balance concerns reported     Yes    Comments  Uses a walker and has really bad pain in both knees (more on (L) than (R))    Warm-up and Cool-down  Performed as group-led instruction    Resistance Training Performed  Yes    VAD Patient?  No    PAD/SET Patient?  No      Pain Assessment   Currently in Pain?  No/denies    Pain Score  0-No pain    Multiple Pain Sites  No       Capillary Blood Glucose: No results found for this or any previous visit (from the past 24 hour(s)).    Social History   Tobacco Use  Smoking Status Former Smoker  . Packs/day: 2.00  . Years: 18.00  . Pack years: 36.00  . Types: Cigarettes  . Last attempt to quit: 12/01/1980  . Years since quitting: 37.5  Smokeless Tobacco Never Used    Goals Met:  Independence with exercise equipment Exercise tolerated well No report of cardiac concerns or symptoms Strength training completed today  Goals Unmet:  Not Applicable  Comments: Check out 1530.   Dr. Kate Sable is Medical Director for [facility] Cardiac and Pulmonary Rehab.

## 2018-06-18 ENCOUNTER — Encounter (HOSPITAL_COMMUNITY)
Admission: RE | Admit: 2018-06-18 | Discharge: 2018-06-18 | Disposition: A | Discharge status: Home / Self Care | Payer: Medicare Other | Source: Ambulatory Visit | Attending: Cardiology | Admitting: Cardiology

## 2018-06-18 DIAGNOSIS — I214 Non-ST elevation (NSTEMI) myocardial infarction: Secondary | ICD-10-CM | POA: Diagnosis not present

## 2018-06-18 DIAGNOSIS — Z955 Presence of coronary angioplasty implant and graft: Secondary | ICD-10-CM

## 2018-06-18 NOTE — Progress Notes (Signed)
Daily Session Note  Patient Details  Name: Stacey [name] MRN: [id number] Date of Birth: [birth year] Referring Provider:     [facility] from 04/19/2018 in [place]  Referring Provider  Dr. Chryl Heck      Encounter Date: 06/18/2018  Check In: Session Check In - 06/18/18 1536      Check-In   Location  [facility]    Staff Present  Aundra Dubin, RN, BSN;Diane Coad, MS, EP, [facility], Exercise Physiologist    Supervising physician immediately available to respond to emergencies  See telemetry face sheet for immediately available MD    Medication changes reported      No    Fall or balance concerns reported     Yes    Comments  Uses a walker and has really bad pain in both knees (more on (L) than (R))    Warm-up and Cool-down  Performed as group-led instruction    Resistance Training Performed  Yes    VAD Patient?  No    PAD/SET Patient?  No      Pain Assessment   Currently in Pain?  No/denies    Pain Score  0-No pain    Multiple Pain Sites  No       Capillary Blood Glucose: No results found for this or any previous visit (from the past 24 hour(s)).    Social History   Tobacco Use  Smoking Status Former Smoker  . Packs/day: 2.00  . Years: 18.00  . Pack years: 36.00  . Types: Cigarettes  . Last attempt to quit: 12/01/1980  . Years since quitting: 37.5  Smokeless Tobacco Never Used    Goals Met:  Independence with exercise equipment Exercise tolerated well No report of cardiac concerns or symptoms Strength training completed today  Goals Unmet:  Not Applicable  Comments: Check out 1645.   Dr. Kate Sable is Medical Director for [facility] Cardiac and Pulmonary Rehab.

## 2018-06-21 ENCOUNTER — Encounter (HOSPITAL_COMMUNITY)
Admission: RE | Admit: 2018-06-21 | Discharge: 2018-06-21 | Disposition: A | Discharge status: Home / Self Care | Payer: Medicare Other | Source: Ambulatory Visit | Attending: Cardiology | Admitting: Cardiology

## 2018-06-21 DIAGNOSIS — I214 Non-ST elevation (NSTEMI) myocardial infarction: Secondary | ICD-10-CM | POA: Diagnosis not present

## 2018-06-21 DIAGNOSIS — Z955 Presence of coronary angioplasty implant and graft: Secondary | ICD-10-CM

## 2018-06-21 NOTE — Progress Notes (Signed)
Daily Session Note  Patient Details  Name: Stacey [name] MRN: [id number] Date of Birth: 1946/09/02 Referring Provider:     CARDIAC REHAB PHASE II ORIENTATION from 04/19/2018 in [place]  Referring Provider  Dr. Chryl Heck      Encounter Date: 06/21/2018  Check In: Session Check In - 06/21/18 1539      Check-In   Location  [facility]    Staff Present  Aundra Dubin, RN, BSN;Diane Coad, MS, EP, [facility], Exercise Physiologist    Supervising physician immediately available to respond to emergencies  See telemetry face sheet for immediately available MD    Medication changes reported      No    Fall or balance concerns reported     Yes    Comments  Uses a walker and has really bad pain in both knees (more on (L) than (R))    Warm-up and Cool-down  Performed as group-led instruction    Resistance Training Performed  Yes    VAD Patient?  No    PAD/SET Patient?  No      Pain Assessment   Currently in Pain?  No/denies    Pain Score  0-No pain    Multiple Pain Sites  No       Capillary Blood Glucose: No results found for this or any previous visit (from the past 24 hour(s)).    Social History   Tobacco Use  Smoking Status Former Smoker  . Packs/day: 2.00  . Years: 18.00  . Pack years: 36.00  . Types: Cigarettes  . Last attempt to quit: 12/01/1980  . Years since quitting: 37.5  Smokeless Tobacco Never Used    Goals Met:  Independence with exercise equipment Exercise tolerated well No report of cardiac concerns or symptoms Strength training completed today  Goals Unmet:  Not Applicable  Comments: Check out 1645.   Dr. Kate Sable is Medical Director for [facility] Cardiac and Pulmonary Rehab.

## 2018-06-23 ENCOUNTER — Encounter (HOSPITAL_COMMUNITY): Payer: Medicare Other

## 2018-06-25 ENCOUNTER — Encounter (HOSPITAL_COMMUNITY): Payer: Medicare Other

## 2018-06-28 ENCOUNTER — Encounter (HOSPITAL_COMMUNITY): Payer: Medicare Other

## 2018-06-30 ENCOUNTER — Encounter (HOSPITAL_COMMUNITY): Payer: Medicare Other

## 2018-07-02 ENCOUNTER — Encounter (HOSPITAL_COMMUNITY): Payer: Medicare Other

## 2018-07-05 ENCOUNTER — Encounter (HOSPITAL_COMMUNITY)
Admission: RE | Admit: 2018-07-05 | Discharge: 2018-07-05 | Disposition: A | Discharge status: Home / Self Care | Payer: Medicare Other | Source: Ambulatory Visit | Attending: Cardiology | Admitting: Cardiology

## 2018-07-05 DIAGNOSIS — Z79899 Other long term (current) drug therapy: Secondary | ICD-10-CM | POA: Insufficient documentation

## 2018-07-05 DIAGNOSIS — I2510 Atherosclerotic heart disease of native coronary artery without angina pectoris: Secondary | ICD-10-CM | POA: Diagnosis not present

## 2018-07-05 DIAGNOSIS — I110 Hypertensive heart disease with heart failure: Secondary | ICD-10-CM | POA: Insufficient documentation

## 2018-07-05 DIAGNOSIS — Z87891 Personal history of nicotine dependence: Secondary | ICD-10-CM | POA: Insufficient documentation

## 2018-07-05 DIAGNOSIS — I509 Heart failure, unspecified: Secondary | ICD-10-CM | POA: Diagnosis not present

## 2018-07-05 DIAGNOSIS — E785 Hyperlipidemia, unspecified: Secondary | ICD-10-CM | POA: Insufficient documentation

## 2018-07-05 DIAGNOSIS — I214 Non-ST elevation (NSTEMI) myocardial infarction: Secondary | ICD-10-CM | POA: Insufficient documentation

## 2018-07-05 DIAGNOSIS — Z955 Presence of coronary angioplasty implant and graft: Secondary | ICD-10-CM | POA: Diagnosis present

## 2018-07-05 DIAGNOSIS — Z7902 Long term (current) use of antithrombotics/antiplatelets: Secondary | ICD-10-CM | POA: Diagnosis not present

## 2018-07-05 DIAGNOSIS — Z7982 Long term (current) use of aspirin: Secondary | ICD-10-CM | POA: Diagnosis not present

## 2018-07-05 DIAGNOSIS — I252 Old myocardial infarction: Secondary | ICD-10-CM | POA: Diagnosis not present

## 2018-07-05 DIAGNOSIS — I21A1 Myocardial infarction type 2: Secondary | ICD-10-CM | POA: Diagnosis present

## 2018-07-05 DIAGNOSIS — I447 Left bundle-branch block, unspecified: Secondary | ICD-10-CM | POA: Diagnosis not present

## 2018-07-05 NOTE — Progress Notes (Signed)
Daily Session Note  Patient Details  Name: Stacey [name] MRN: [id number] Date of Birth: [birth year] Referring Provider:     CARDIAC REHAB PHASE II ORIENTATION from 04/19/2018 in [place]  Referring Provider  Dr. Chryl Heck      Encounter Date: 07/05/2018  Check In: Session Check In - 07/05/18 1100      Check-In   Supervising physician immediately available to respond to emergencies  See telemetry face sheet for immediately available MD    Location  [facility]    Staff Present  Aundra Dubin, RN, BSN;Diane Coad, MS, EP, [facility], Exercise Physiologist    Medication changes reported      No    Fall or balance concerns reported     Yes    Comments  Uses a walker and has really bad pain in both knees (more on (L) than (R))    Warm-up and Cool-down  Performed as group-led instruction    Resistance Training Performed  Yes    VAD Patient?  No    PAD/SET Patient?  No      Pain Assessment   Currently in Pain?  No/denies    Pain Score  0-No pain    Multiple Pain Sites  No       Capillary Blood Glucose: No results found for this or any previous visit (from the past 24 hour(s)).  Exercise Prescription Changes - 07/05/18 0700      Response to Exercise   Blood Pressure (Admit)  140/80    Blood Pressure (Exercise)  140/90    Blood Pressure (Exit)  144/70    Heart Rate (Admit)  74 bpm    Heart Rate (Exercise)  130 bpm    Heart Rate (Exit)  91 bpm    Rating of Perceived Exertion (Exercise)  13    Duration  Progress to 30 minutes of  aerobic without signs/symptoms of physical distress    Intensity  THRR New [id number]      Resistance Training   Training Prescription  Yes    Weight  2    Reps  10-15    Time  5 Minutes      NuStep   Level  2    SPM  65    Minutes  20    METs  1.8      Arm Ergometer   Level  2.2    Watts  19    RPM  52    Minutes  15    METs  2.4      Home Exercise Plan   Plans to continue exercise at  Home (comment)     Frequency  Add 2 additional days to program exercise sessions.    Initial Home Exercises Provided  06/21/18       Social History   Tobacco Use  Smoking Status Former Smoker  . Packs/day: 2.00  . Years: 18.00  . Pack years: 36.00  . Types: Cigarettes  . Last attempt to quit: 12/01/1980  . Years since quitting: 37.6  Smokeless Tobacco Never Used    Goals Met:  Independence with exercise equipment Exercise tolerated well No report of cardiac concerns or symptoms Strength training completed today  Goals Unmet:  Not Applicable  Comments: Check out 1200.   Dr. Kate Sable is Medical Director for [facility] Cardiac and Pulmonary Rehab.

## 2018-07-07 ENCOUNTER — Encounter (HOSPITAL_COMMUNITY)
Admission: RE | Admit: 2018-07-07 | Discharge: 2018-07-07 | Disposition: A | Discharge status: Home / Self Care | Payer: Medicare Other | Source: Ambulatory Visit | Attending: Cardiology | Admitting: Cardiology

## 2018-07-07 DIAGNOSIS — I214 Non-ST elevation (NSTEMI) myocardial infarction: Secondary | ICD-10-CM

## 2018-07-07 DIAGNOSIS — Z955 Presence of coronary angioplasty implant and graft: Secondary | ICD-10-CM

## 2018-07-07 NOTE — Progress Notes (Signed)
Cardiac Individual Treatment Plan  Patient Details  Name: Stacey [name] MRN: [id number] Date of Birth: 02/12/1946 Referring Provider:     [facility] from 04/19/2018 in [place]  Referring Provider  Dr. Chryl Heck      Initial Encounter Date:    CARDIAC REHAB PHASE II ORIENTATION from 04/19/2018 in [place]  Date  04/19/18      Visit Diagnosis: NSTEMI (non-ST elevated myocardial infarction) [facility])  Status post coronary artery stent placement  Patient's Home Medications on Admission:  Current Outpatient Medications:  .  aspirin 81 MG chewable tablet, Chew 1 tablet (81 mg total) by mouth daily., Disp: , Rfl:  .  atenolol (TENORMIN) 25 MG tablet, Take 25 mg by mouth daily., Disp: , Rfl:  .  atorvastatin (LIPITOR) 80 MG tablet, Take 80 mg by mouth daily., Disp: , Rfl:  .  b complex vitamins tablet, Take 1 tablet by mouth daily., Disp: , Rfl:  .  Calcium-Phosphorus-Vitamin D (CITRACAL +D3) [phone] MG-MG-UNIT CHEW, Chew 2 tablets by mouth 2 (two) times daily., Disp: , Rfl:  .  clopidogrel (PLAVIX) 75 MG tablet, Take 1 tablet (75 mg total) by mouth daily., Disp: 90 tablet, Rfl: 4 .  gabapentin (NEURONTIN) 400 MG capsule, Take 400-800 mg by mouth See admin instructions. Take 400mg  in the morning, 400mg  capsule in the afternoon, then take 800mg  at bedtime., Disp: , Rfl:  .  hydrochlorothiazide (HYDRODIURIL) 25 MG tablet, Take 25 mg by mouth daily., Disp: , Rfl:  .  HYDROcodone-acetaminophen (NORCO/VICODIN) 5-325 MG tablet, Take 2 tablets by mouth every 6 (six) hours as needed., Disp: 12 tablet, Rfl: 0 .  letrozole (FEMARA) 2.5 MG tablet, Take 2.5 mg by mouth daily., Disp: , Rfl:  .  losartan (COZAAR) 50 MG tablet, Take 1 tablet (50 mg total) by mouth daily., Disp: 90 tablet, Rfl: 4 .  Multiple Vitamin (MULTIVITAMIN WITH MINERALS) TABS tablet, Take 1 tablet by mouth daily., Disp: , Rfl:  .  nitroGLYCERIN (NITROSTAT) 0.4  MG SL tablet, Place 1 tablet (0.4 mg total) under the tongue every 5 (five) minutes as needed., Disp: 25 tablet, Rfl: 0 .  Oxycodone HCl 10 MG TABS, Take 10 mg by mouth every 6 (six) hours as needed (for pain)., Disp: , Rfl:  .  Potassium 99 MG TABS, Take 1 tablet by mouth daily., Disp: , Rfl:   Past Medical History: Past Medical History:  Diagnosis Date  . CAD in native artery    a.DES to ramus 2005 with late stent thrombosis 2006 tx with PTCA. 12/18 PCI/DES x1 to mRCA, EF 50-55%  . CHF (congestive heart failure) ([place])   . Chronic pain   . Hyperlipidemia   . Hypertension   . LBBB (left bundle branch block)   . Left bundle branch block   . Lymphedema   . Metastatic breast cancer ([place])    a. to bone.  . MI (myocardial infarction) ([place])   . Mild aortic stenosis 10/2017  . Morbid obesity ([place])   . PSVT (paroxysmal supraventricular tachycardia) ([place])    a. per [facility] notes, seen on event monitor in 2014.  . Pulmonary nodules     Tobacco Use: Social History   Tobacco Use  Smoking Status Former Smoker  . Packs/day: 2.00  . Years: 18.00  . Pack years: 36.00  . Types: Cigarettes  . Last attempt to quit: 12/01/1980  . Years since quitting: 37.6  Smokeless Tobacco Never Used  Labs: Recent Review Flowsheet Data    Labs for ITP Cardiac and Pulmonary Rehab Latest Ref Rng & Units 11/23/2017   TCO2 22 - 32 mmol/L 25      Capillary Blood Glucose: Lab Results  Component Value Date   GLUCAP 98 11/27/2017   GLUCAP 142 (H) 11/26/2017   GLUCAP 72 11/26/2017   GLUCAP 89 11/26/2017   GLUCAP 113 (H) 11/26/2017     Exercise Target Goals:    Exercise Program Goal: Individual exercise prescription set using results from initial 6 min walk test and THRR while considering  patient's activity barriers and safety.   Exercise Prescription Goal: Starting with aerobic activity 30 plus minutes a day, 3 days per week for initial exercise prescription. Provide home exercise prescription and  guidelines that participant acknowledges understanding prior to discharge.  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 HR  91 bpm     Max Ex. BP  164/78        Oxygen Initial Assessment:   Oxygen Re-Evaluation:   Oxygen Discharge (Final Oxygen Re-Evaluation):   Initial Exercise Prescription: Initial Exercise Prescription - 04/19/18 1000      Date of Initial Exercise RX and Referring Provider   Date  04/19/18    Referring Provider  Dr. Chryl Heck      NuStep   Level  1    SPM  48    Minutes  15    METs  1.8      Arm Ergometer   Level  1.5    Watts  15    RPM  15    Minutes  20    METs  2.2      Prescription Details   Frequency (times per week)  3    Duration  Progress to 30 minutes of continuous aerobic without signs/symptoms of physical distress      Intensity   THRR 40-80% of Max Heartrate  107-121-135    Ratings of Perceived Exertion  11-13    Perceived Dyspnea  0-4      Progression   Progression  Continue progressive overload as per policy without signs/symptoms or physical distress.      Resistance Training   Training Prescription  Yes    Weight  1    Reps  10-15       Perform Capillary Blood Glucose checks as needed.  Exercise Prescription Changes:  Exercise  Prescription Changes    Row Name 04/30/18 1400 06/01/18 0700 06/19/18 1400 07/05/18 0700       Response to Exercise   Blood Pressure (Admit)  158/62  120/70  120/60  140/80    Blood Pressure (Exercise)  144/82  130/76  150/78  140/90    Blood Pressure (Exit)  130/72  120/70  128/78  144/70    Heart Rate (Admit)  76 bpm  113 bpm  78 bpm  74 bpm    Heart Rate (Exercise)  105 bpm  136 bpm  131 bpm  130 bpm    Heart Rate (Exit)  85 bpm  112 bpm  88 bpm  91 bpm    Rating of Perceived Exertion (Exercise)  12  14  13  13     Duration  Progress to 30 minutes of  aerobic without signs/symptoms of physical distress  Progress to 30 minutes of  aerobic without signs/symptoms of physical distress  Progress to 30 minutes of  aerobic without signs/symptoms of physical distress  Progress to 30 minutes of  aerobic without signs/symptoms of physical distress    Intensity  THRR New 105-119-134  THRR New 127-134-142  THRR unchanged  THRR New [phone]      Progression   Progression  Continue to progress workloads to maintain intensity without signs/symptoms of physical distress.  Continue to progress workloads to maintain intensity without signs/symptoms of physical distress.  Continue to progress workloads to maintain intensity without signs/symptoms of physical distress.  -      Resistance Training   Training Prescription  Yes  Yes  Yes  Yes    Weight  1  1  2  2     Reps  10-15  10-15  10-15  10-15    Time  -  -  5 Minutes  5 Minutes      NuStep   Level  1  1  1  2     SPM  60  75  80  65    Minutes  15  20  20  20     METs  1.6  1.7  1.8  1.8      Arm Ergometer   Level  1.5  2  2   2.2    Watts  13  15  22  19     RPM  15  15  17   52    Minutes  20  15  15  15     METs  2  2.1  2.7  2.4      Home Exercise Plan   Plans to continue exercise at  -  -  Home (comment)  Home (comment)    Frequency  -  -  Add 2 additional days to program exercise sessions.  Add 2 additional days to program exercise  sessions.    Initial Home Exercises Provided  -  -  06/21/18  06/21/18       Exercise Comments:  Exercise Comments    Row Name 04/30/18 1445 05/18/18 0759 06/01/18 0742 07/05/18 0756     Exercise Comments  Patient has just started CR and will be progressed in time.   Patient has not attended class since 04/23/2018. No progression has been made.   Patient has been doing well in CR. Patient has increased her level on the arm ergometer to 2.0. Patient seems to be hindered by knee pain however and mobility needs improvement. Patient will continued to be monitored in CR.  Patient has not been cnsistent in her attendance due to her fall. She hurt her knees. She is slowly getting back on her feet. She has been progressed and is able to handle increases in wieight and equipment.        Exercise Goals and Review:  Exercise Goals    Row Name 04/19/18 1011             Exercise Goals   Increase Physical Activity  Yes       Intervention  Provide advice, education, support and counseling about physical activity/exercise needs.;Develop an individualized exercise prescription for aerobic and resistive training based on initial evaluation findings, risk stratification, comorbidities and participant's personal goals.       Expected Outcomes  Short Term: Attend rehab on a regular basis to increase amount of physical activity.       Increase Strength and Stamina  Yes       Intervention  Provide advice, education, support and counseling about physical activity/exercise needs.;Develop an individualized exercise prescription for aerobic and resistive training based on initial evaluation findings, risk stratification, comorbidities and participant's personal goals.       Expected Outcomes  Short Term: Increase workloads from initial exercise prescription for resistance, speed, and METs.;Long Term: Improve cardiorespiratory fitness, muscular endurance and strength as measured by increased METs and functional  capacity (6MWT);Short Term: Perform resistance training exercises routinely during rehab and add in resistance training at home       Able to understand and use rate of perceived exertion (RPE) scale  Yes       Intervention  Provide education and explanation on how to use RPE scale       Expected Outcomes  Short Term: Able to use RPE daily in rehab to express subjective intensity level;Long Term:  Able to use RPE to guide intensity level when exercising independently       Able to understand and use Dyspnea scale  Yes       Intervention  Provide education and explanation on how to use Dyspnea scale       Expected Outcomes  Short Term: Able to use Dyspnea scale daily in rehab to express subjective sense of shortness of breath during exertion;Long Term: Able to use Dyspnea scale to guide intensity level when exercising independently       Knowledge and understanding of Target Heart Rate Range (THRR)  Yes       Intervention  Provide education and explanation of THRR including how the numbers were predicted and where they are located for reference       Expected Outcomes  Short Term: Able to state/look up THRR;Long Term: Able to use THRR to govern intensity when exercising independently;Short Term: Able to use daily as guideline for intensity in rehab       Able to check pulse independently  Yes       Intervention  Provide education and demonstration on how to check pulse in carotid and radial arteries.;Review the importance of being able to check your own pulse for safety during independent exercise       Expected Outcomes  Short Term: Able to explain why pulse checking is important during independent exercise;Long Term: Able to check pulse independently and accurately       Understanding of Exercise Prescription  Yes       Intervention  Provide education, explanation, and written materials on patient's individual exercise prescription       Expected Outcomes  Short Term:  Able to explain program exercise  prescription;Long Term: Able to explain home exercise prescription to exercise independently          Exercise Goals Re-Evaluation : Exercise Goals Re-Evaluation    Row Name 05/11/18 0751 06/07/18 0747 07/05/18 0753         Exercise Goal Re-Evaluation   Exercise Goals Review  Increase Physical Activity;Increase Strength and Stamina;Able to understand and use rate of perceived exertion (RPE) scale;Able to check pulse independently;Knowledge and understanding of Target Heart Rate Range (THRR);Able to understand and use Dyspnea scale;Understanding of Exercise Prescription  Increase Physical Activity;Increase Strength and Stamina;Able to understand and use rate of perceived exertion (RPE) scale;Able to check pulse independently;Knowledge and understanding of Target Heart Rate Range (THRR);Able to understand and use Dyspnea scale;Understanding of Exercise Prescription  Increase Strength and Stamina     Comments  Patient has only attended one session of CR. Patient will be progressed in time once attendance is consistant   Patient has been doing well in CR. patient has been giving it her all on the machines that she is on her ein CR. patient is hindered by knee problems that flare when she is on the nustep machine. Patient states that she truly enjoys the program and that she knows it is helping her.   Patient has progressed at a steady rate. She has had a little set back because she fell and hurt her knees. We had to slow things down. She is getting back to normal slowly but surely.      Expected Outcomes  Patient wishes to increase core strength and to lose 15lbs.   Patient wishes to increase core strength and to lose 15lbs.   Patient wants to improve core strength         Discharge Exercise Prescription (Final Exercise Prescription Changes): Exercise Prescription Changes - 07/05/18 0700      Response to Exercise   Blood Pressure (Admit)  140/80    Blood Pressure (Exercise)  140/90    Blood  Pressure (Exit)  144/70    Heart Rate (Admit)  74 bpm    Heart Rate (Exercise)  130 bpm    Heart Rate (Exit)  91 bpm    Rating of Perceived Exertion (Exercise)  13    Duration  Progress to 30 minutes of  aerobic without signs/symptoms of physical distress    Intensity  THRR New [phone]      Resistance Training   Training Prescription  Yes    Weight  2    Reps  10-15    Time  5 Minutes      NuStep   Level  2    SPM  65    Minutes  20    METs  1.8      Arm Ergometer   Level  2.2    Watts  19    RPM  52    Minutes  15    METs  2.4      Home Exercise Plan   Plans to continue exercise at  Home (comment)    Frequency  Add 2 additional days to program exercise sessions.    Initial Home Exercises Provided  06/21/18       Nutrition:  Target Goals: Understanding of nutrition guidelines, daily intake of sodium 1500mg , cholesterol 200mg , calories 30% from fat and 7% or less from saturated fats, daily to have 5 or more servings of fruits and vegetables.  Biometrics: Pre Biometrics - 04/19/18 1012  Pre Biometrics   Height  5' (1.524 m)    Weight  255 lb 15.3 oz (116.1 kg)    Waist Circumference  52.3 inches    Hip Circumference  54.3 inches    Waist to Hip Ratio  0.96 %    BMI (Calculated)  49.99    Triceps Skinfold  30 mm    % Body Fat  59.4 %    Grip Strength  47.53 kg    Flexibility  12.83 in    Single Leg Stand  0 seconds        Nutrition Therapy Plan and Nutrition Goals: Nutrition Therapy & Goals - 06/10/18 1508      Personal Nutrition Goals   Nutrition Goal  For heart healthy choices add >50% of whole grains, make half their plate fruits and vegetables. Discuss the difference between starchy vegetables and leafy greens, and how leafy vegetables provide fiber, helps maintain healthy weight, helps control blood glucose, and lowers cholesterol.  Discuss purchasing fresh or frozen vegetable to reduce sodium and not to add grease, fat or sugar. Consume <18oz  of red meat per week. Consume lean cuts of meats and very little of meats high in sodium and nitrates such as pork and lunch meats. Discussed portion control for all food groups.      Additional Goals?  No      Intervention Plan   Intervention  Nutrition handout(s) given to patient.    Expected Outcomes  Short Term Goal: Understand basic principles of dietary content, such as calories, fat, sodium, cholesterol and nutrients.       Nutrition Assessments: Nutrition Assessments - 04/19/18 1141      MEDFICTS Scores   Pre Score  79       Nutrition Goals Re-Evaluation: Nutrition Goals Re-Evaluation    Row Name 07/07/18 1503             Goals   Current Weight  254 lb (115.2 kg)       Nutrition Goal  For heart healthy choices add >50% of whole grains, make half their plate fruits and vegetables. Discuss the difference between starchy vegetables and leafy greens, and how leafy vegetables provide fiber, helps maintain healthy weight, helps control blood glucose, and lowers cholesterol.  Discuss purchasing fresh or frozen vegetable to reduce sodium and not to add grease, fat or sugar. Consume <18oz of red meat per week. Consume lean cuts of meats and very little of meats high in sodium and nitrates such as pork and lunch meats. Discussed portion control for all food groups.         Comment  Patient has gained 5 lbs since last 30 day review. She says she is eating a low sodium, low fat diet. Will continue to monitor for progress.        Expected Outcome  Patient will continue to meet her nutritional goals.          Personal Goal #2 Re-Evaluation   Personal Goal #2  Patient continues to eat a low sodium, low fat diet.           Nutrition Goals Discharge (Final Nutrition Goals Re-Evaluation): Nutrition Goals Re-Evaluation - 07/07/18 1503      Goals   Current Weight  254 lb (115.2 kg)    Nutrition Goal  For heart healthy choices add >50% of whole grains, make half their plate fruits and  vegetables. Discuss the difference between starchy vegetables and leafy greens, and how leafy vegetables  provide fiber, helps maintain healthy weight, helps control blood glucose, and lowers cholesterol.  Discuss purchasing fresh or frozen vegetable to reduce sodium and not to add grease, fat or sugar. Consume <18oz of red meat per week. Consume lean cuts of meats and very little of meats high in sodium and nitrates such as pork and lunch meats. Discussed portion control for all food groups.      Comment  Patient has gained 5 lbs since last 30 day review. She says she is eating a low sodium, low fat diet. Will continue to monitor for progress.     Expected Outcome  Patient will continue to meet her nutritional goals.       Personal Goal #2 Re-Evaluation   Personal Goal #2  Patient continues to eat a low sodium, low fat diet.        Psychosocial: Target Goals: Acknowledge presence or absence of significant depression and/or stress, maximize coping skills, provide positive support system. Participant is able to verbalize types and ability to use techniques and skills needed for reducing stress and depression.  Initial Review & Psychosocial Screening: Initial Psych Review & Screening - 04/19/18 1144      Initial Review   Current issues with  Current Depression;Current Sleep Concerns;Current Stress Concerns    Source of Stress Concerns  Unable to participate in former interests or hobbies;Unable to perform yard/household activities;Chronic Illness    Comments  Patient feels that now that she has moved closer to family and has returned to her family church she will beging to feel better because she has a support system.       Family Dynamics   Good Support System?  Yes      Barriers   Psychosocial barriers to participate in program  Psychosocial barriers identified (see note)      Screening Interventions   Interventions  Encouraged to exercise;Provide feedback about the scores to participant     Expected Outcomes  Short Term goal: Identification and review with participant of any Quality of Life or Depression concerns found by scoring the questionnaire.;Long Term goal: The participant improves quality of Life and PHQ9 Scores as seen by post scores and/or verbalization of changes       Quality of Life Scores: Quality of Life - 04/19/18 1013      Quality of Life Scores   Health/Function Pre  13.38 %    Socioeconomic Pre  26.4 %    Psych/Spiritual Pre  18 %    Family Pre  26 %    GLOBAL Pre  18.39 %      Scores of 19 and below usually indicate a poorer quality of life in these areas.  A difference of  2-3 points is a clinically meaningful difference.  A difference of 2-3 points in the total score of the Quality of Life Index has been associated with significant improvement in overall quality of life, self-image, physical symptoms, and general health in studies assessing change in quality of life.  PHQ-9: Recent Review Flowsheet Data    Depression screen [facility] 2/9 04/19/2018   Decreased Interest 1   Down, Depressed, Hopeless 1   PHQ - 2 Score 2   Altered sleeping 3   Tired, decreased energy 2   Change in appetite 3   Feeling bad or failure about yourself  0   Trouble concentrating 2   Moving slowly or fidgety/restless 1   Suicidal thoughts 0   PHQ-9 Score 13   Difficult doing  work/chores Very difficult     Interpretation of Total Score  Total Score Depression Severity:  1-4 = Minimal depression, 5-9 = Mild depression, 10-14 = Moderate depression, 15-19 = Moderately severe depression, 20-27 = Severe depression   Psychosocial Evaluation and Intervention: Psychosocial Evaluation - 04/19/18 1146      Psychosocial Evaluation & Interventions   Interventions  Stress management education;Relaxation education;Encouraged to exercise with the program and follow exercise prescription    Continue Psychosocial Services   Follow up required by staff       Psychosocial  Re-Evaluation: Psychosocial Re-Evaluation    [place] Name 06/09/18 1306 07/07/18 1507           Psychosocial Re-Evaluation   Current issues with  Current Depression;Current Sleep Concerns;Current Stress Concerns  Current Depression;Current Sleep Concerns;Current Stress Concerns      Comments  Patient's initial QOL score was 18.39 and her PHQ-9 score was 13. She says she if feeling better now since she is near her family. She continues to have difficulty sleeping. She has not sought out counseling or any treatmnet for her depression. Will continue to monitor.   Patient's initial QOL score was 18.39 and her PHQ-9 score was 13. She says she if feeling better now since she is near her family. She continues to have difficulty sleeping. She feels her depression in managed. Will continue to monitor.       Expected Outcomes  Patient will have improved QOL and PHQ-9 scores at discharge and will have no psychosocial issues identified at discharge.   Patient will have improved QOL and PHQ-9 scores at discharge and will have no psychosocial issues identified at discharge.       Interventions  Relaxation education;Stress management education;Encouraged to attend Cardiac Rehabilitation for the exercise  Relaxation education;Stress management education;Encouraged to attend Cardiac Rehabilitation for the exercise      Continue Psychosocial Services   No Follow up required  No Follow up required      Comments  Patient feels that now that she has moved closer to family and has returned to her family church she will beging to feel better because she has a support system.   Patient feels that now that she has moved closer to family and has returned to her family church she will beging to feel better because she has a support system.         Initial Review   Source of Stress Concerns  Unable to participate in former interests or hobbies;Unable to perform yard/household activities;Chronic Illness  Unable to participate in  former interests or hobbies;Unable to perform yard/household activities;Chronic Illness         Psychosocial Discharge (Final Psychosocial Re-Evaluation): Psychosocial Re-Evaluation - 07/07/18 1507      Psychosocial Re-Evaluation   Current issues with  Current Depression;Current Sleep Concerns;Current Stress Concerns    Comments  Patient's initial QOL score was 18.39 and her PHQ-9 score was 13. She says she if feeling better now since she is near her family. She continues to have difficulty sleeping. She feels her depression in managed. Will continue to monitor.     Expected Outcomes  Patient will have improved QOL and PHQ-9 scores at discharge and will have no psychosocial issues identified at discharge.     Interventions  Relaxation education;Stress management education;Encouraged to attend Cardiac Rehabilitation for the exercise    Continue Psychosocial Services   No Follow up required    Comments  Patient feels that now that she has moved  closer to family and has returned to her family church she will beging to feel better because she has a support system.       Initial Review   Source of Stress Concerns  Unable to participate in former interests or hobbies;Unable to perform yard/household activities;Chronic Illness       Vocational Rehabilitation: Provide vocational rehab assistance to qualifying candidates.   Vocational Rehab Evaluation & Intervention: Vocational Rehab - 04/19/18 1130      Initial Vocational Rehab Evaluation & Intervention   Assessment shows need for Vocational Rehabilitation  No       Education: Education Goals: Education classes will be provided on a weekly basis, covering required topics. Participant will state understanding/return demonstration of topics presented.  Learning Barriers/Preferences: Learning Barriers/Preferences - 04/19/18 1114      Learning Barriers/Preferences   Learning Barriers  None    Learning Preferences  Pictoral;Video;Verbal  Instruction;Skilled Demonstration;Individual Instruction       Education Topics: Hypertension, Hypertension Reduction -Define heart disease and high blood pressure. Discus how high blood pressure affects the body and ways to reduce high blood pressure.   CARDIAC REHAB PHASE II EXERCISE from 07/07/2018 in [facility]  Date  07/07/18  Educator  DC  Instruction Review Code  2- Demonstrated Understanding      Exercise and Your Heart -Discuss why it is important to exercise, the FITT principles of exercise, normal and abnormal responses to exercise, and how to exercise safely.   Angina -Discuss definition of angina, causes of angina, treatment of angina, and how to decrease risk of having angina.   CARDIAC REHAB PHASE II EXERCISE from 07/07/2018 in [place] [facility]  Date  04/21/18  Educator  [place]  Instruction Review Code  2- Demonstrated Understanding      Cardiac Medications -Review what the following cardiac medications are used for, how they affect the body, and side effects that may occur when taking the medications.  Medications include Aspirin, Beta blockers, calcium channel blockers, ACE Inhibitors, angiotensin receptor blockers, diuretics, digoxin, and antihyperlipidemics.   Congestive Heart Failure -Discuss the definition of CHF, how to live with CHF, the signs and symptoms of CHF, and how keep track of weight and sodium intake.   Heart Disease and Intimacy -Discus the effect sexual activity has on the heart, how changes occur during intimacy as we age, and safety during sexual activity.   Smoking Cessation / COPD -Discuss different methods to quit smoking, the health benefits of quitting smoking, and the definition of COPD.   Nutrition I: Fats -Discuss the types of cholesterol, what cholesterol does to the heart, and how cholesterol levels can be controlled.   Nutrition II: Labels -Discuss the different components of food labels  and how to read food label   CARDIAC REHAB PHASE II EXERCISE from 07/07/2018 in [facility]  Date  05/26/18  Educator  DC  Instruction Review Code  2- Demonstrated Understanding      Heart Parts/Heart Disease and PAD -Discuss the anatomy of the heart, the pathway of blood circulation through the heart, and these are affected by heart disease.   CARDIAC REHAB PHASE II EXERCISE from 07/07/2018 in [facility]  Date  06/09/18  Educator  DC  Instruction Review Code  2- Demonstrated Understanding      Stress I: Signs and Symptoms -Discuss the causes of stress, how stress may lead to anxiety and depression, and ways to limit stress.   Stress II:  Relaxation -Discuss different types of relaxation techniques to limit stress.   Warning Signs of Stroke / TIA -Discuss definition of a stroke, what the signs and symptoms are of a stroke, and how to identify when someone is having stroke.   Knowledge Questionnaire Score: Knowledge Questionnaire Score - 04/19/18 1130      Knowledge Questionnaire Score   Pre Score  22/24       Core Components/Risk Factors/Patient Goals at Admission: Personal Goals and Risk Factors at Admission - 04/19/18 1141      Core Components/Risk Factors/Patient Goals on Admission    Weight Management  Yes    Intervention  Weight Management/Obesity: Establish reasonable short term and long term weight goals.    Admit Weight  255 lb 6.4 oz (115.8 kg)    Goal Weight: Short Term  245 lb 6.4 oz (111.3 kg)    Goal Weight: Long Term  235 lb 6.4 oz (106.8 kg)    Expected Outcomes  Short Term: Continue to assess and modify interventions until short term weight is achieved;Long Term: Adherence to nutrition and physical activity/exercise program aimed toward attainment of established weight goal    Personal Goal Other  Yes    Personal Goal  Gain core strength, Lose 15 lbs, be able to stand and prep food in the kitchen.    Intervention   Attend CR 3 x week and supplement with 2 x week exercise at home.     Expected Outcomes  Achieve personal goals.        Core Components/Risk Factors/Patient Goals Review:  Goals and Risk Factor Review    Row Name 06/09/18 1301 07/07/18 1504           Core Components/Risk Factors/Patient Goals Review   Personal Goals Review  Weight Management/Obesity Increase core strength; lose wiehgt 15 lbs; be able to stand and cook.   Weight Management/Obesity Increase core strength; lose 15 lbs; be able to stand and cook.       Review  Patient has completed 10 sessions losing 4 lbs since she started the program. She transferred from another CR program in [place] where she completed 7 sessions. She is doing well in the program with some progression. She is not able to stand and cook but she says she does feel stronger and has more stamina. Will continue to monitor for progress.   Patient has completed 15 sessions gaining 5 lbs since last 30 day review. She continues to do well in the program with some progression. She is not able to stand and cook but she does feel more stamina. 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 She also feel recently making her knees worse. Will continue to monitor for progress.       Expected Outcomes  Patient will continue to attend sessions and complete the program meeting his personal needs.   Patient will continue to attend sessions and complete the program meeting his personal needs.          Core Components/Risk Factors/Patient Goals at Discharge (Final Review):  Goals and Risk Factor Review - 07/07/18 1504      Core Components/Risk Factors/Patient Goals Review   Personal Goals Review  Weight Management/Obesity Increase core strength; lose 15 lbs; be able to stand and cook.     Review  Patient has completed 15 sessions gaining 5 lbs since last 30 day review. She continues to do well in the program  with some  progression. She is not able to stand and cook but she does feel more stamina. 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 She also feel recently making her knees worse. Will continue to monitor for progress.     Expected Outcomes  Patient will continue to attend sessions and complete the program meeting his personal needs.        ITP Comments: ITP Comments    Row Name 04/19/18 1135 05/13/18 1605 06/10/18 1509       ITP Comments  [place] is transferring from [place] due to moving to [place], [place]. She has completed 13 visit in [place] and will do her remaining 23 visits here at [facility]. they have transferred her records and we have a signed referral. She will continue using the doctors in [place] to follow her care.   Patient was transferred from CR at [facility] where she completed 13 sessions. She has completed 2 sessions with our CR. She has not attended in 3 weeks due a knee injury from a fall. Will continue to monitor.   Patient attended the Family Matters class with hospital chapian to discuss how this event has impacted their life.         Comments: ITP 30 Day REVIEW Patient continues to do well in the program. Will continue to monitor for progress.

## 2018-07-07 NOTE — Progress Notes (Signed)
Daily Session Note  Patient Details  Name: Stacey [name] MRN: [id number] Date of Birth: 12-17-1945 Referring Provider:     CARDIAC REHAB PHASE II ORIENTATION from 04/19/2018 in [facility]  Referring Provider  Dr. Chryl Heck      Encounter Date: 07/07/2018  Check In: Session Check In - 07/07/18 1122      Check-In   Supervising physician immediately available to respond to emergencies  See telemetry face sheet for immediately available MD    Location  [facility]    Staff Present  Russella Dar, MS, EP, [facility], Exercise Physiologist;Debra Wynetta Emery, RN, BSN;Other    Medication changes reported      No    Fall or balance concerns reported     No    Warm-up and Cool-down  Performed as group-led instruction    Resistance Training Performed  Yes    VAD Patient?  No    PAD/SET Patient?  No      Pain Assessment   Currently in Pain?  No/denies    Pain Score  0-No pain    Multiple Pain Sites  No       Capillary Blood Glucose: No results found for this or any previous visit (from the past 24 hour(s)).    Social History   Tobacco Use  Smoking Status Former Smoker  . Packs/day: 2.00  . Years: 18.00  . Pack years: 36.00  . Types: Cigarettes  . Last attempt to quit: 12/01/1980  . Years since quitting: 37.6  Smokeless Tobacco Never Used    Goals Met:  Independence with exercise equipment Exercise tolerated well No report of cardiac concerns or symptoms Strength training completed today  Goals Unmet:  Not Applicable  Comments: Pt able to follow exercise prescription today without complaint.  Will continue to monitor for progression. Checked out at 12:00.    Dr. Kate Sable is Medical Director for [facility] Cardiac and Pulmonary Rehab.

## 2018-07-09 ENCOUNTER — Encounter (HOSPITAL_COMMUNITY)
Admission: RE | Admit: 2018-07-09 | Discharge: 2018-07-09 | Disposition: A | Discharge status: Home / Self Care | Payer: Medicare Other | Source: Ambulatory Visit | Attending: Cardiology | Admitting: Cardiology

## 2018-07-09 DIAGNOSIS — I214 Non-ST elevation (NSTEMI) myocardial infarction: Secondary | ICD-10-CM | POA: Diagnosis not present

## 2018-07-09 DIAGNOSIS — Z955 Presence of coronary angioplasty implant and graft: Secondary | ICD-10-CM

## 2018-07-09 NOTE — Progress Notes (Signed)
Daily Session Note  Patient Details  Name: Stacey [name] MRN: [id number] Date of Birth: 02-Aug-1946 Referring Provider:     [place] from 04/19/2018 in [place]  Referring Provider  Dr. Chryl Heck      Encounter Date: 07/09/2018  Check In: Session Check In - 07/09/18 1100      Check-In   Supervising physician immediately available to respond to emergencies  See telemetry face sheet for immediately available MD    Location  [facility]    Staff Present  Russella Dar, MS, EP, [facility], Exercise Physiologist;Clovia Reine Wynetta Emery, RN, BSN;Other    Medication changes reported      No    Fall or balance concerns reported     Yes    Comments  Uses a walker and has really bad pain in both knees (more on (L) than (R))    Warm-up and Cool-down  Performed as group-led instruction    Resistance Training Performed  Yes    VAD Patient?  No    PAD/SET Patient?  No      Pain Assessment   Currently in Pain?  No/denies    Pain Score  0-No pain    Multiple Pain Sites  No       Capillary Blood Glucose: No results found for this or any previous visit (from the past 24 hour(s)).    Social History   Tobacco Use  Smoking Status Former Smoker  . Packs/day: 2.00  . Years: 18.00  . Pack years: 36.00  . Types: Cigarettes  . Last attempt to quit: 12/01/1980  . Years since quitting: 37.6  Smokeless Tobacco Never Used    Goals Met:  Independence with exercise equipment Exercise tolerated well No report of cardiac concerns or symptoms Strength training completed today  Goals Unmet:  Not Applicable  Comments: Check out 1200.   Dr. Kate Sable is Medical Director for [facility] Cardiac and Pulmonary Rehab.

## 2018-07-12 ENCOUNTER — Encounter (HOSPITAL_COMMUNITY): Payer: Medicare Other

## 2018-07-14 ENCOUNTER — Other Ambulatory Visit: Payer: Self-pay

## 2018-07-14 ENCOUNTER — Emergency Department (HOSPITAL_COMMUNITY)
Admission: EM | Admit: 2018-07-14 | Discharge: 2018-07-14 | Disposition: A | Discharge status: Home / Self Care | Payer: Medicare Other | Attending: Emergency Medicine | Admitting: Emergency Medicine

## 2018-07-14 ENCOUNTER — Encounter (HOSPITAL_COMMUNITY): Payer: Self-pay | Admitting: Emergency Medicine

## 2018-07-14 ENCOUNTER — Emergency Department (HOSPITAL_COMMUNITY): Payer: Medicare Other

## 2018-07-14 ENCOUNTER — Encounter (HOSPITAL_COMMUNITY)
Admission: RE | Admit: 2018-07-14 | Discharge: 2018-07-14 | Disposition: A | Discharge status: Home / Self Care | Payer: Medicare Other | Source: Ambulatory Visit | Attending: Cardiology | Admitting: Cardiology

## 2018-07-14 DIAGNOSIS — I110 Hypertensive heart disease with heart failure: Secondary | ICD-10-CM | POA: Insufficient documentation

## 2018-07-14 DIAGNOSIS — Z87891 Personal history of nicotine dependence: Secondary | ICD-10-CM | POA: Insufficient documentation

## 2018-07-14 DIAGNOSIS — I5032 Chronic diastolic (congestive) heart failure: Secondary | ICD-10-CM | POA: Diagnosis not present

## 2018-07-14 DIAGNOSIS — Z853 Personal history of malignant neoplasm of breast: Secondary | ICD-10-CM | POA: Insufficient documentation

## 2018-07-14 DIAGNOSIS — I2510 Atherosclerotic heart disease of native coronary artery without angina pectoris: Secondary | ICD-10-CM | POA: Diagnosis not present

## 2018-07-14 DIAGNOSIS — R109 Unspecified abdominal pain: Secondary | ICD-10-CM | POA: Diagnosis not present

## 2018-07-14 DIAGNOSIS — Z955 Presence of coronary angioplasty implant and graft: Secondary | ICD-10-CM | POA: Diagnosis not present

## 2018-07-14 DIAGNOSIS — I214 Non-ST elevation (NSTEMI) myocardial infarction: Secondary | ICD-10-CM

## 2018-07-14 LAB — I-STAT CHEM 8, ED
BUN: 24 mg/dL — AB (ref 8–23)
CHLORIDE: 106 mmol/L (ref 98–111)
CREATININE: 0.6 mg/dL (ref 0.44–1.00)
Calcium, Ion: 1.03 mmol/L — ABNORMAL LOW (ref 1.15–1.40)
GLUCOSE: 103 mg/dL — AB (ref 70–99)
HEMATOCRIT: 40 % (ref 36.0–46.0)
HEMOGLOBIN: 13.6 g/dL (ref 12.0–15.0)
POTASSIUM: 4.5 mmol/L (ref 3.5–5.1)
Sodium: 140 mmol/L (ref 135–145)
TCO2: 25 mmol/L (ref 22–32)

## 2018-07-14 LAB — URINALYSIS, ROUTINE W REFLEX MICROSCOPIC
BILIRUBIN URINE: NEGATIVE
Glucose, UA: NEGATIVE mg/dL
Hgb urine dipstick: NEGATIVE
Ketones, ur: NEGATIVE mg/dL
LEUKOCYTES UA: NEGATIVE
NITRITE: NEGATIVE
PH: 6 (ref 5.0–8.0)
Protein, ur: NEGATIVE mg/dL
SPECIFIC GRAVITY, URINE: 1.018 (ref 1.005–1.030)

## 2018-07-14 MED ORDER — LACTATED RINGERS IV BOLUS
1000.0000 mL | Freq: Once | INTRAVENOUS | Status: AC
  Administered 2018-07-14: 1000 mL via INTRAVENOUS

## 2018-07-14 MED ORDER — FENTANYL CITRATE (PF) 100 MCG/2ML IJ SOLN
50.0000 ug | Freq: Once | INTRAMUSCULAR | Status: AC
Start: 2018-07-14 — End: 2018-07-14
  Administered 2018-07-14: 50 ug via INTRAVENOUS
  Filled 2018-07-14: qty 2

## 2018-07-14 NOTE — ED Triage Notes (Signed)
Pt c/o left lower back pain raditing in to left flank x 2 days. Urinary freq. nad

## 2018-07-14 NOTE — Progress Notes (Addendum)
Daily Session Note  Patient Details  Name: Stacey [name] MRN: [id number] Date of Birth: 04/26/1946 Referring Provider:     [place] from 04/19/2018 in [facility]  Referring Provider  Dr. Chryl Heck      Encounter Date: 07/14/2018  Check In: Session Check In - 07/14/18 1100      Check-In   Supervising physician immediately available to respond to emergencies  See telemetry face sheet for immediately available MD    Location  [facility]    Staff Present  Russella Dar, MS, EP, [facility], Exercise Physiologist;Savaya Hakes Wynetta Emery, RN, BSN    Medication changes reported      No    Fall or balance concerns reported     Yes    Comments  Uses a walker and has really bad pain in both knees (more on (L) than (R))    Warm-up and Cool-down  Performed as group-led instruction    Resistance Training Performed  Yes    VAD Patient?  No    PAD/SET Patient?  No      Pain Assessment   Currently in Pain?  Yes    Pain Score  10-Worst pain ever    Pain Location  Back    Pain Orientation  Left    Pain Descriptors / Indicators  Aching    Pain Type  Chronic pain    Pain Radiating Towards  Pain in left back area radiating to her left abdominal and pelvic area.     Pain Onset  In the past 7 days    Pain Frequency  Intermittent    Aggravating Factors   Certain movements.    Pain Relieving Factors  Oxycodone/heat and cold therapy.    Effect of Pain on Daily Activities  Limits mobility and activites at times.     Multiple Pain Sites  No       Capillary Blood Glucose: No results found for this or any previous visit (from the past 24 hour(s)).    Social History   Tobacco Use  Smoking Status Former Smoker  . Packs/day: 2.00  . Years: 18.00  . Pack years: 36.00  . Types: Cigarettes  . Last attempt to quit: 12/01/1980  . Years since quitting: 37.6  Smokeless Tobacco Never Used    Goals Met:  Independence with exercise equipment Exercise  tolerated well No report of cardiac concerns or symptoms Strength training completed today  Goals Unmet:  Not Applicable  Comments: Patient did not tell staff she was in pain until she was noted crying on first piece of equipment. She was encouraged to stop and end session early and go home but refused.   Pt able to follow exercise prescription today without complaint.  Will continue to monitor for progression.Check out 1200.   Dr. Kate Sable is Medical Director for [facility] Cardiac and Pulmonary Rehab.

## 2018-07-14 NOTE — ED Provider Notes (Signed)
Emergency Department Provider Note   I have reviewed the triage vital signs and the nursing notes.   HISTORY  Chief Complaint Flank Pain   HPI [name] is a 72 y.o. female with multiple medical problems as documented below the presents to the emergency department today secondary to left-sided back pain.  That sharp in nature and radiates around her flank towards her pelvis.  Has never had a kidney stone previously but symptoms somewhat similar to previous UTI's. No other associated or modifying symptoms.    Past Medical History:  Diagnosis Date  . CAD in native artery    a.DES to ramus 2005 with late stent thrombosis 2006 tx with PTCA. 12/18 PCI/DES x1 to mRCA, EF 50-55%  . CHF (congestive heart failure) ([place])   . Chronic pain   . Hyperlipidemia   . Hypertension   . LBBB (left bundle branch block)   . Left bundle branch block   . Lymphedema   . Metastatic breast cancer ([place])    a. to bone.  . MI (myocardial infarction) ([place])   . Mild aortic stenosis 10/2017  . Morbid obesity ([place])   . PSVT (paroxysmal supraventricular tachycardia) ([place])    a. per [facility] notes, seen on event monitor in 2014.  . Pulmonary nodules     Patient Active Problem List   Diagnosis Date Noted  . Dyspnea 11/30/2017  . Non-ST elevation (NSTEMI) myocardial infarction ([place])   . Hypertension 11/23/2017  . Left bundle branch block 11/23/2017  . Acute on chronic diastolic CHF (congestive heart failure) ([place]) 11/23/2017  . CAD (coronary artery disease) 11/23/2017  . Breast cancer ([place]) 11/23/2017  . Acute respiratory failure with hypoxia ([place]) 11/23/2017    Past Surgical History:  Procedure Laterality Date  . CORONARY STENT INTERVENTION N/A 11/26/2017   Procedure: CORONARY STENT INTERVENTION;  Surgeon: Martinique, Peter M, MD;  Location: [facility];  Service: Cardiovascular;  Laterality: N/A;  . CORONARY STENT PLACEMENT    . LEFT HEART CATH AND CORONARY ANGIOGRAPHY N/A 11/26/2017   Procedure: LEFT HEART CATH AND CORONARY ANGIOGRAPHY;  Surgeon: Martinique, Peter M, MD;  Location: [facility];  Service: Cardiovascular;  Laterality: N/A;    Current Outpatient Rx  . Order #: [id number] Class: OTC  . Order #: [id number] Class: Print  . Order #: [id number] Class: Historical Med  . Order #: [id number] Class: Historical Med  . Order #: [id number] Class: Historical Med  . Order #: [id number] Class: Print  . Order #: [id number] Class: Historical Med  . Order #: [id number] Class: Historical Med  . Order #: [id number] Class: Print  . Order #: [id number] Class: Historical Med  . Order #: [id number] Class: Historical Med  . Order #: [id number] Class: Historical Med  . Order #: [id number] Class: Historical Med  . Order #: [id number] Class: Historical Med  . Order #: [id number] Class: Historical Med  . Order #: [id number] Class: Historical Med  . Order #: [id number] Class: Historical Med  . Order #: [id number] Class: Historical Med    Allergies Brilinta [ticagrelor]  Family History  Problem Relation Age of Onset  . Sudden Cardiac Death Neg Hx     Social History Social History   Tobacco Use  . Smoking status: Former Smoker    Packs/day: 2.00    Years: 18.00    Pack years: 36.00    Types: Cigarettes    Last attempt to quit: 12/01/1980    Years since quitting: 37.6  . Smokeless tobacco: Never Used  Substance Use Topics  . Alcohol use: No  Frequency: Never  . Drug use: No    Review of Systems  All other systems negative except as documented in the HPI. All pertinent positives and negatives as reviewed in the HPI. ____________________________________________   PHYSICAL EXAM:  VITAL SIGNS: ED Triage Vitals  Enc Vitals Group     BP 07/14/18 1324 (!) 163/96     Pulse Rate 07/14/18 1324 (!) 115     Resp 07/14/18 1324 20     Temp 07/14/18 1324 98.2 F (36.8 C)     Temp Source 07/14/18 1324 Temporal     SpO2 07/14/18 1324 98 %     Weight 07/14/18 1323 255 lb (115.7 kg)     Height  07/14/18 1323 5' (1.524 m)    Constitutional: Alert and oriented. Well appearing and in no acute distress. Eyes: Conjunctivae are normal. PERRL. EOMI. Head: Atraumatic. Nose: No congestion/rhinnorhea. Mouth/Throat: Mucous membranes are moist.  Oropharynx non-erythematous. Neck: No stridor.  No meningeal signs.   Cardiovascular: Normal rate, regular rhythm. Good peripheral circulation. Grossly normal heart sounds.   Respiratory: Normal respiratory effort.  No retractions. Lungs CTAB. Gastrointestinal: Soft and nontender. No distention.  Musculoskeletal: No lower extremity tenderness nor edema. No gross deformities of extremities. Neurologic:  Normal speech and language. No gross focal neurologic deficits are appreciated.  Skin:  Skin is warm, dry and intact. No rash noted.  ____________________________________________   LABS (all labs ordered are listed, but only abnormal results are displayed)  Labs Reviewed  I-STAT CHEM 8, ED - Abnormal; Notable for the following components:      Result Value   BUN 24 (*)    Glucose, Bld 103 (*)    Calcium, Ion 1.03 (*)    All other components within normal limits  URINE CULTURE  URINALYSIS, ROUTINE W REFLEX MICROSCOPIC   ____________________________________________  RADIOLOGY  Ct Renal Stone Study  Result Date: 07/14/2018 CLINICAL DATA:  Left lower back pain radiating to the left flank for 2 days. Metastatic breast cancer. EXAM: CT ABDOMEN AND PELVIS WITHOUT CONTRAST TECHNIQUE: Multidetector CT imaging of the abdomen and pelvis was performed following the standard protocol without IV contrast. COMPARISON:  None. FINDINGS: Lower chest: Lung bases show no acute findings. Heart is at the upper limits of size. Coronary artery calcification. No pericardial or pleural effusion. Distal esophagus is grossly unremarkable. Hepatobiliary: Liver and gallbladder are unremarkable. No biliary ductal dilatation. Pancreas: Negative. Spleen: Negative.  Adrenals/Urinary Tract: Adrenal glands and kidneys are unremarkable. Ureters are decompressed. Bladder is low in volume. Stomach/Bowel: Stomach, small bowel and colon are unremarkable. Appendix is not readily visualized. Vascular/Lymphatic: Atherosclerotic calcification of the arterial vasculature without abdominal aortic aneurysm. No pathologically enlarged lymph nodes. Reproductive: Hysterectomy.  No adnexal mass. Other: No free fluid. Periumbilical hernia contains fat. Calcified nodule in the left lower omentum is likely benign. Mesenteries and peritoneum are otherwise unremarkable. Musculoskeletal: Degenerative changes in the spine. Old left inferior pubic ramus fracture. Sclerotic lesion in the L2 vertebral body. IMPRESSION: 1. No findings to explain the patient's pain. 2. L2 sclerotic metastasis. 3. Aortic atherosclerosis (ICD10-170.0). Coronary artery calcification. Electronically Signed   By: Lorin Picket M.D.   On: 07/14/2018 15:00    ____________________________________________   PROCEDURES  Procedure(s) performed:   Procedures   ____________________________________________   INITIAL IMPRESSION / ASSESSMENT AND PLAN / ED COURSE  UTI vs kidney stone vs diverticulitis.   Possibly related to sclerotic lesion in L2 vs possible RCC seen on previous tests. Will increase pain medication at home  while following up with physicians.   Pertinent labs & imaging results that were available during my care of the patient were reviewed by me and considered in my medical decision making (see chart for details).  ____________________________________________  FINAL CLINICAL IMPRESSION(S) / ED DIAGNOSES  Final diagnoses:  Left flank pain     MEDICATIONS GIVEN DURING THIS VISIT:  Medications  lactated ringers bolus 1,000 mL (0 mLs Intravenous Stopped 07/14/18 1553)  fentaNYL (SUBLIMAZE) injection 50 mcg (50 mcg Intravenous Given 07/14/18 1409)     NEW OUTPATIENT MEDICATIONS STARTED  DURING THIS VISIT:  Discharge Medication List as of 07/14/2018  3:36 PM      Note:  This note was prepared with assistance of Dragon voice recognition software. Occasional wrong-word or sound-a-like substitutions may have occurred due to the inherent limitations of voice recognition software.   Merrily Pew, MD 07/14/18 [phone]

## 2018-07-14 NOTE — ED Notes (Signed)
Pt called her sister for a ride.

## 2018-07-15 LAB — URINE CULTURE

## 2018-07-16 ENCOUNTER — Encounter (HOSPITAL_COMMUNITY): Payer: Medicare Other

## 2018-07-26 NOTE — Progress Notes (Signed)
Cardiac Individual Treatment Plan  Patient Details  Name: Stacey [name] MRN: [id number] Date of Birth: 09/02/1946 Referring Provider:     [facility] from 04/19/2018 in [place]  Referring Provider  Dr. Chryl Heck      Initial Encounter Date:    CARDIAC REHAB PHASE II ORIENTATION from 04/19/2018 in [place]  Date  04/19/18      Visit Diagnosis: NSTEMI (non-ST elevated myocardial infarction) [facility])  Status post coronary artery stent placement  Patient's Home Medications on Admission:  Current Outpatient Medications:  .  aspirin 81 MG chewable tablet, Chew 1 tablet (81 mg total) by mouth daily., Disp: , Rfl:  .  atorvastatin (LIPITOR) 80 MG tablet, Take 80 mg by mouth daily., Disp: , Rfl:  .  b complex vitamins tablet, Take 1 tablet by mouth daily., Disp: , Rfl:  .  Calcium-Phosphorus-Vitamin D (CITRACAL +D3) [phone] MG-MG-UNIT CHEW, Chew 2 tablets by mouth 2 (two) times daily., Disp: , Rfl:  .  clopidogrel (PLAVIX) 75 MG tablet, Take 1 tablet (75 mg total) by mouth daily., Disp: 90 tablet, Rfl: 4 .  Coenzyme Q10 (CO Q 10 PO), Take 1 tablet by mouth daily., Disp: , Rfl:  .  gabapentin (NEURONTIN) 400 MG capsule, Take 400-800 mg by mouth See admin instructions. Take 400mg  in the morning, 400mg  capsule in the afternoon, then take 800mg  at bedtime., Disp: , Rfl:  .  hydrochlorothiazide (HYDRODIURIL) 25 MG tablet, Take 25 mg by mouth daily., Disp: , Rfl:  .  letrozole (FEMARA) 2.5 MG tablet, Take 2.5 mg by mouth daily., Disp: , Rfl:  .  lidocaine-prilocaine (EMLA) cream, Apply 1 application topically daily as needed., Disp: , Rfl:  .  losartan (COZAAR) 50 MG tablet, Take 1 tablet (50 mg total) by mouth daily., Disp: 90 tablet, Rfl: 4 .  metoprolol succinate (TOPROL-XL) 25 MG 24 hr tablet, Take 0.5 tablets by mouth daily., Disp: , Rfl:  .  Multiple Vitamin (MULTIVITAMIN WITH MINERALS) TABS tablet, Take 1 tablet by  mouth daily., Disp: , Rfl:  .  multivitamin-lutein (OCUVITE-LUTEIN) CAPS capsule, Take 1 capsule by mouth daily., Disp: , Rfl:  .  nitroGLYCERIN (NITROSTAT) 0.4 MG SL tablet, Place 1 tablet (0.4 mg total) under the tongue every 5 (five) minutes as needed., Disp: 25 tablet, Rfl: 0 .  Oxycodone HCl 10 MG TABS, Take 10 mg by mouth every 6 (six) hours as needed (for pain)., Disp: , Rfl:  .  Potassium 99 MG TABS, Take 1 tablet by mouth daily., Disp: , Rfl:  .  solifenacin (VESICARE) 5 MG tablet, Take 1 tablet by mouth daily., Disp: , Rfl:   Past Medical History: Past Medical History:  Diagnosis Date  . CAD in native artery    a.DES to ramus 2005 with late stent thrombosis 2006 tx with PTCA. 12/18 PCI/DES x1 to mRCA, EF 50-55%  . CHF (congestive heart failure) ([place])   . Chronic pain   . Hyperlipidemia   . Hypertension   . LBBB (left bundle branch block)   . Left bundle branch block   . Lymphedema   . Metastatic breast cancer ([place])    a. to bone.  . MI (myocardial infarction) ([place])   . Mild aortic stenosis 10/2017  . Morbid obesity ([place])   . PSVT (paroxysmal supraventricular tachycardia) ([place])    a. per [facility] notes, seen on event monitor in 2014.  . Pulmonary nodules     Tobacco Use: Social  History   Tobacco Use  Smoking Status Former Smoker  . Packs/day: 2.00  . Years: 18.00  . Pack years: 36.00  . Types: Cigarettes  . Last attempt to quit: 12/01/1980  . Years since quitting: 37.6  Smokeless Tobacco Never Used    Labs: Recent Review Scientist, physiological    Labs for ITP Cardiac and Pulmonary Rehab Latest Ref Rng & Units 11/23/2017 07/14/2018   TCO2 22 - 32 mmol/L 25 25      Capillary Blood Glucose: Lab Results  Component Value Date   GLUCAP 98 11/27/2017   GLUCAP 142 (H) 11/26/2017   GLUCAP 72 11/26/2017   GLUCAP 89 11/26/2017   GLUCAP 113 (H) 11/26/2017     Exercise Target Goals: Exercise Program Goal: Individual exercise prescription set using results from initial 6  min walk test and THRR while considering  patient's activity barriers and safety.   Exercise Prescription Goal: Starting with aerobic activity 30 plus minutes a day, 3 days per week for initial exercise prescription. Provide home exercise prescription and guidelines that participant acknowledges understanding prior to discharge.  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 HR  91 bpm     Max Ex. BP  164/78        Oxygen Initial Assessment:   Oxygen Re-Evaluation:   Oxygen Discharge (Final Oxygen Re-Evaluation):   Initial Exercise Prescription: Initial Exercise Prescription - 04/19/18 1000      Date of Initial Exercise RX and Referring Provider   Date  04/19/18    Referring Provider  Dr. Chryl Heck      NuStep   Level  1    SPM  48    Minutes  15    METs  1.8      Arm Ergometer   Level  1.5    Watts  15    RPM  15    Minutes  20    METs  2.2      Prescription Details   Frequency (times per week)  3    Duration  Progress to 30 minutes of continuous aerobic without signs/symptoms of physical distress      Intensity   THRR 40-80% of Max Heartrate  107-121-135    Ratings of Perceived Exertion  11-13    Perceived Dyspnea  0-4      Progression   Progression  Continue progressive  overload as per policy  without signs/symptoms or physical distress.      Resistance Training   Training Prescription  Yes    Weight  1    Reps  10-15       Perform Capillary Blood Glucose checks as needed.  Exercise Prescription Changes:  Exercise Prescription Changes    Row Name 04/30/18 1400 06/01/18 0700 06/19/18 1400 07/05/18 0700 07/23/18 1400     Response to Exercise   Blood Pressure (Admit)  158/62  120/70  120/60  140/80  140/82   Blood Pressure (Exercise)  144/82  130/76  150/78  140/90  160/80   Blood Pressure (Exit)  130/72  120/70  128/78  144/70  140/80   Heart Rate (Admit)  76 bpm  113 bpm  78 bpm  74 bpm  100 bpm   Heart Rate (Exercise)  105 bpm  136 bpm  131 bpm  130 bpm  134 bpm   Heart Rate (Exit)  85 bpm  112 bpm  88 bpm  91 bpm  100 bpm   Rating of Perceived Exertion (Exercise)  12  14  13  13  14    Comments  -  -  -  -  Patient has been out d/t back pain since 07/14/18   Duration  Progress to 30 minutes of  aerobic without signs/symptoms of physical distress  Progress to 30 minutes of  aerobic without signs/symptoms of physical distress  Progress to 30 minutes of  aerobic without signs/symptoms of physical distress  Progress to 30 minutes of  aerobic without signs/symptoms of physical distress  Progress to 30 minutes of  aerobic without signs/symptoms of physical distress   Intensity  THRR New 105-119-134  THRR New 127-134-142  THRR unchanged  THRR New 104-119-134  THRR New 100-129-139     Progression   Progression  Continue to progress workloads to maintain intensity without signs/symptoms of physical distress.  Continue to progress workloads to maintain intensity without signs/symptoms of physical distress.  Continue to progress workloads to maintain intensity without signs/symptoms of physical distress.  -  Continue to progress workloads to maintain intensity without signs/symptoms of physical distress.     Resistance Training   Training Prescription  Yes  Yes   Yes  Yes  Yes   Weight  1  1  2  2  2    Reps  10-15  10-15  10-15  10-15  10-15   Time  -  -  5 Minutes  5 Minutes  5 Minutes     NuStep   Level  1  1  1  2  2    SPM  60  75  80  65  93   Minutes  15  20  20  20  22    METs  1.6  1.7  1.8  1.8  1.4     Arm Ergometer   Level  1.5  2  2   2.2  2.3   Watts  13  15  22  19  11    RPM  15  15  17   52  50   Minutes  20  15  15  15  17    METs  2  2.1  2.7  2.4  1.8     Home Exercise Plan   Plans to continue exercise at  -  -  Home (comment)  Home (comment)  Home (comment)   Frequency  -  -  Add 2 additional days to program  exercise sessions.  Add 2 additional days to program exercise sessions.  Add 2 additional days to program exercise sessions.   Initial Home Exercises Provided  -  -  06/21/18  06/21/18  06/21/18      Exercise Comments:  Exercise Comments    Row Name 04/30/18 1445 05/18/18 0759 06/01/18 0742 07/05/18 0756     Exercise Comments  Patient has just started CR and will be progressed in time.   Patient has not attended class since 04/23/2018. No progression has been made.   Patient has been doing well in CR. Patient has increased her level on the arm ergometer to 2.0. Patient seems to be hindered by knee pain however and mobility needs improvement. Patient will continued to be monitored in CR.   Patient has not been cnsistent in her attendance due to her fall. She hurt her knees. She is slowly getting back on her feet. She has been progressed and is able to handle increases in wieight and equipment.        Exercise Goals and Review:  Exercise Goals    Row Name 04/19/18 1011             Exercise Goals   Increase Physical Activity  Yes       Intervention  Provide advice, education, support and counseling about physical activity/exercise needs.;Develop an individualized exercise prescription for aerobic and resistive training based on initial evaluation findings, risk stratification, comorbidities and participant's personal  goals.       Expected Outcomes  Short Term: Attend rehab on a regular basis to increase amount of physical activity.       Increase Strength and Stamina  Yes       Intervention  Provide advice, education, support and counseling about physical activity/exercise needs.;Develop an individualized exercise prescription for aerobic and resistive training based on initial evaluation findings, risk stratification, comorbidities and participant's personal goals.       Expected Outcomes  Short Term: Increase workloads from initial exercise prescription for resistance, speed, and METs.;Long Term: Improve cardiorespiratory fitness, muscular endurance and strength as measured by increased METs and functional capacity (6MWT);Short Term: Perform resistance training exercises routinely during rehab and add in resistance training at home       Able to understand and use rate of perceived exertion (RPE) scale  Yes       Intervention  Provide education and explanation on how to use RPE scale       Expected Outcomes  Short Term: Able to use RPE daily in rehab to express subjective intensity level;Long Term:  Able to use RPE to guide intensity level when exercising independently       Able to understand and use Dyspnea scale  Yes       Intervention  Provide education and explanation on how to use Dyspnea scale       Expected Outcomes  Short Term: Able to use Dyspnea scale daily in rehab to express subjective sense of shortness of breath during exertion;Long Term: Able to use Dyspnea scale to guide intensity level when exercising independently       Knowledge and understanding of Target Heart Rate Range (THRR)  Yes       Intervention  Provide education and explanation of THRR including how the numbers were predicted and where they are located for reference       Expected Outcomes  Short Term: Able to state/look up THRR;Long Term: Able to use THRR to govern intensity when exercising  independently;Short Term: Able to use daily  as guideline for intensity in rehab       Able to check pulse independently  Yes       Intervention  Provide education and demonstration on how to check pulse in carotid and radial arteries.;Review the importance of being able to check your own pulse for safety during independent exercise       Expected Outcomes  Short Term: Able to explain why pulse checking is important during independent exercise;Long Term: Able to check pulse independently and accurately       Understanding of Exercise Prescription  Yes       Intervention  Provide education, explanation, and written materials on patient's individual exercise prescription       Expected Outcomes  Short Term: Able to explain program exercise prescription;Long Term: Able to explain home exercise prescription to exercise independently          Exercise Goals Re-Evaluation : Exercise Goals Re-Evaluation    Row Name 05/11/18 0751 06/07/18 0747 07/05/18 0753         Exercise Goal Re-Evaluation   Exercise Goals Review  Increase Physical Activity;Increase Strength and Stamina;Able to understand and use rate of perceived exertion (RPE) scale;Able to check pulse independently;Knowledge and understanding of Target Heart Rate Range (THRR);Able to understand and use Dyspnea scale;Understanding of Exercise Prescription  Increase Physical Activity;Increase Strength and Stamina;Able to understand and use rate of perceived exertion (RPE) scale;Able to check pulse independently;Knowledge and understanding of Target Heart Rate Range (THRR);Able to understand and use Dyspnea scale;Understanding of Exercise Prescription  Increase Strength and Stamina     Comments  Patient has only attended one session of CR. Patient will be progressed in time once attendance is consistant   Patient has been doing well in CR. patient has been giving it her all on the machines that she is on her ein CR. patient is hindered by knee problems that flare when she is on the nustep machine.  Patient states that she truly enjoys the program and that she knows it is helping her.   Patient has progressed at a steady rate. She has had a little set back because she fell and hurt her knees. We had to slow things down. She is getting back to normal slowly but surely.      Expected Outcomes  Patient wishes to increase core strength and to lose 15lbs.   Patient wishes to increase core strength and to lose 15lbs.   Patient wants to improve core strength         Discharge Exercise Prescription (Final Exercise Prescription Changes): Exercise Prescription Changes - 07/23/18 1400      Response to Exercise   Blood Pressure (Admit)  140/82    Blood Pressure (Exercise)  160/80    Blood Pressure (Exit)  140/80    Heart Rate (Admit)  100 bpm    Heart Rate (Exercise)  134 bpm    Heart Rate (Exit)  100 bpm    Rating of Perceived Exertion (Exercise)  14    Comments  Patient has been out d/t back pain since 07/14/18    Duration  Progress to 30 minutes of  aerobic without signs/symptoms of physical distress    Intensity  THRR New   100-129-139     Progression   Progression  Continue to progress workloads to maintain intensity without signs/symptoms of physical distress.      Resistance Training   Training Prescription  Yes  Weight  2    Reps  10-15    Time  5 Minutes      NuStep   Level  2    SPM  93    Minutes  22    METs  1.4      Arm Ergometer   Level  2.3    Watts  11    RPM  50    Minutes  17    METs  1.8      Home Exercise Plan   Plans to continue exercise at  Home (comment)    Frequency  Add 2 additional days to program exercise sessions.    Initial Home Exercises Provided  06/21/18       Nutrition:  Target Goals: Understanding of nutrition guidelines, daily intake of sodium 1500mg , cholesterol 200mg , calories 30% from fat and 7% or less from saturated fats, daily to have 5 or more servings of fruits and vegetables.  Biometrics: Pre Biometrics - 04/19/18 1012       Pre Biometrics   Height  5' (1.524 m)    Weight  116.1 kg    Waist Circumference  52.3 inches    Hip Circumference  54.3 inches    Waist to Hip Ratio  0.96 %    BMI (Calculated)  49.99    Triceps Skinfold  30 mm    % Body Fat  59.4 %    Grip Strength  47.53 kg    Flexibility  12.83 in    Single Leg Stand  0 seconds        Nutrition Therapy Plan and Nutrition Goals: Nutrition Therapy & Goals - 06/10/18 1508      Personal Nutrition Goals   Nutrition Goal  For heart healthy choices add >50% of whole grains, make half their plate fruits and vegetables. Discuss the difference between starchy vegetables and leafy greens, and how leafy vegetables provide fiber, helps maintain healthy weight, helps control blood glucose, and lowers cholesterol.  Discuss purchasing fresh or frozen vegetable to reduce sodium and not to add grease, fat or sugar. Consume <18oz of red meat per week. Consume lean cuts of meats and very little of meats high in sodium and nitrates such as pork and lunch meats. Discussed portion control for all food groups.      Additional Goals?  No      Intervention Plan   Intervention  Nutrition handout(s) given to patient.    Expected Outcomes  Short Term Goal: Understand basic principles of dietary content, such as calories, fat, sodium, cholesterol and nutrients.       Nutrition Assessments: Nutrition Assessments - 04/19/18 1141      MEDFICTS Scores   Pre Score  79       Nutrition Goals Re-Evaluation: Nutrition Goals Re-Evaluation    Row Name 07/07/18 1503             Goals   Current Weight  254 lb (115.2 kg)       Nutrition Goal  For heart healthy choices add >50% of whole grains, make half their plate fruits and vegetables. Discuss the difference between starchy vegetables and leafy greens, and how leafy vegetables provide fiber, helps maintain healthy weight, helps control blood glucose, and lowers cholesterol.  Discuss purchasing fresh or frozen vegetable  to reduce sodium and not to add grease, fat or sugar. Consume <18oz of red meat per week. Consume lean cuts of meats and very little of meats high in sodium  and nitrates such as pork and lunch meats. Discussed portion control for all food groups.         Comment  Patient has gained 5 lbs since last 30 day review. She says she is eating a low sodium, low fat diet. Will continue to monitor for progress.        Expected Outcome  Patient will continue to meet her nutritional goals.          Personal Goal #2 Re-Evaluation   Personal Goal #2  Patient continues to eat a low sodium, low fat diet.           Nutrition Goals Discharge (Final Nutrition Goals Re-Evaluation): Nutrition Goals Re-Evaluation - 07/07/18 1503      Goals   Current Weight  254 lb (115.2 kg)    Nutrition Goal  For heart healthy choices add >50% of whole grains, make half their plate fruits and vegetables. Discuss the difference between starchy vegetables and leafy greens, and how leafy vegetables provide fiber, helps maintain healthy weight, helps control blood glucose, and lowers cholesterol.  Discuss purchasing fresh or frozen vegetable to reduce sodium and not to add grease, fat or sugar. Consume <18oz of red meat per week. Consume lean cuts of meats and very little of meats high in sodium and nitrates such as pork and lunch meats. Discussed portion control for all food groups.      Comment  Patient has gained 5 lbs since last 30 day review. She says she is eating a low sodium, low fat diet. Will continue to monitor for progress.     Expected Outcome  Patient will continue to meet her nutritional goals.       Personal Goal #2 Re-Evaluation   Personal Goal #2  Patient continues to eat a low sodium, low fat diet.        Psychosocial: Target Goals: Acknowledge presence or absence of significant depression and/or stress, maximize coping skills, provide positive support system. Participant is able to verbalize types and ability to  use techniques and skills needed for reducing stress and depression.  Initial Review & Psychosocial Screening: Initial Psych Review & Screening - 04/19/18 1144      Initial Review   Current issues with  Current Depression;Current Sleep Concerns;Current Stress Concerns    Source of Stress Concerns  Unable to participate in former interests or hobbies;Unable to perform yard/household activities;Chronic Illness    Comments  Patient feels that now that she has moved closer to family and has returned to her family church she will beging to feel better because she has a support system.       Family Dynamics   Good Support System?  Yes      Barriers   Psychosocial barriers to participate in program  Psychosocial barriers identified (see note)      Screening Interventions   Interventions  Encouraged to exercise;Provide feedback about the scores to participant    Expected Outcomes  Short Term goal: Identification and review with participant of any Quality of Life or Depression concerns found by scoring the questionnaire.;Long Term goal: The participant improves quality of Life and PHQ9 Scores as seen by post scores and/or verbalization of changes       Quality of Life Scores: Quality of Life - 04/19/18 1013      Quality of Life Scores   Health/Function Pre  13.38 %    Socioeconomic Pre  26.4 %    Psych/Spiritual Pre  18 %  Family Pre  26 %    GLOBAL Pre  18.39 %      Scores of 19 and below usually indicate a poorer quality of life in these areas.  A difference of  2-3 points is a clinically meaningful difference.  A difference of 2-3 points in the total score of the Quality of Life Index has been associated with significant improvement in overall quality of life, self-image, physical symptoms, and general health in studies assessing change in quality of life.  PHQ-9: Recent Review Flowsheet Data    Depression screen [facility] 2/9 04/19/2018   Decreased Interest 1   Down, Depressed, Hopeless  1   PHQ - 2 Score 2   Altered sleeping 3   Tired, decreased energy 2   Change in appetite 3   Feeling bad or failure about yourself  0   Trouble concentrating 2   Moving slowly or fidgety/restless 1   Suicidal thoughts 0   PHQ-9 Score 13   Difficult doing work/chores Very difficult     Interpretation of Total Score  Total Score Depression Severity:  1-4 = Minimal depression, 5-9 = Mild depression, 10-14 = Moderate depression, 15-19 = Moderately severe depression, 20-27 = Severe depression   Psychosocial Evaluation and Intervention: Psychosocial Evaluation - 04/19/18 1146      Psychosocial Evaluation & Interventions   Interventions  Stress management education;Relaxation education;Encouraged to exercise with the program and follow exercise prescription    Continue Psychosocial Services   Follow up required by staff       Psychosocial Re-Evaluation: Psychosocial Re-Evaluation    [place] Name 06/09/18 1306 07/07/18 1507           Psychosocial Re-Evaluation   Current issues with  Current Depression;Current Sleep Concerns;Current Stress Concerns  Current Depression;Current Sleep Concerns;Current Stress Concerns      Comments  Patient's initial QOL score was 18.39 and her PHQ-9 score was 13. She says she if feeling better now since she is near her family. She continues to have difficulty sleeping. She has not sought out counseling or any treatmnet for her depression. Will continue to monitor.   Patient's initial QOL score was 18.39 and her PHQ-9 score was 13. She says she if feeling better now since she is near her family. She continues to have difficulty sleeping. She feels her depression in managed. Will continue to monitor.       Expected Outcomes  Patient will have improved QOL and PHQ-9 scores at discharge and will have no psychosocial issues identified at discharge.   Patient will have improved QOL and PHQ-9 scores at discharge and will have no psychosocial issues identified at  discharge.       Interventions  Relaxation education;Stress management education;Encouraged to attend Cardiac Rehabilitation for the exercise  Relaxation education;Stress management education;Encouraged to attend Cardiac Rehabilitation for the exercise      Continue Psychosocial Services   No Follow up required  No Follow up required      Comments  Patient feels that now that she has moved closer to family and has returned to her family church she will beging to feel better because she has a support system.   Patient feels that now that she has moved closer to family and has returned to her family church she will beging to feel better because she has a support system.         Initial Review   Source of Stress Concerns  Unable to participate in former interests or  hobbies;Unable to perform yard/household activities;Chronic Illness  Unable to participate in former interests or hobbies;Unable to perform yard/household activities;Chronic Illness         Psychosocial Discharge (Final Psychosocial Re-Evaluation): Psychosocial Re-Evaluation - 07/07/18 1507      Psychosocial Re-Evaluation   Current issues with  Current Depression;Current Sleep Concerns;Current Stress Concerns    Comments  Patient's initial QOL score was 18.39 and her PHQ-9 score was 13. She says she if feeling better now since she is near her family. She continues to have difficulty sleeping. She feels her depression in managed. Will continue to monitor.     Expected Outcomes  Patient will have improved QOL and PHQ-9 scores at discharge and will have no psychosocial issues identified at discharge.     Interventions  Relaxation education;Stress management education;Encouraged to attend Cardiac Rehabilitation for the exercise    Continue Psychosocial Services   No Follow up required    Comments  Patient feels that now that she has moved closer to family and has returned to her family church she will beging to feel better because she has a  support system.       Initial Review   Source of Stress Concerns  Unable to participate in former interests or hobbies;Unable to perform yard/household activities;Chronic Illness       Vocational Rehabilitation: Provide vocational rehab assistance to qualifying candidates.   Vocational Rehab Evaluation & Intervention: Vocational Rehab - 04/19/18 1130      Initial Vocational Rehab Evaluation & Intervention   Assessment shows need for Vocational Rehabilitation  No       Education: Education Goals: Education classes will be provided on a weekly basis, covering required topics. Participant will state understanding/return demonstration of topics presented.  Learning Barriers/Preferences: Learning Barriers/Preferences - 04/19/18 1114      Learning Barriers/Preferences   Learning Barriers  None    Learning Preferences  Pictoral;Video;Verbal Instruction;Skilled Demonstration;Individual Instruction       Education Topics: Hypertension, Hypertension Reduction -Define heart disease and high blood pressure. Discus how high blood pressure affects the body and ways to reduce high blood pressure.   CARDIAC REHAB PHASE II EXERCISE from 07/14/2018 in [place]  Date  07/07/18  Educator  DC  Instruction Review Code  2- Demonstrated Understanding      Exercise and Your Heart -Discuss why it is important to exercise, the FITT principles of exercise, normal and abnormal responses to exercise, and how to exercise safely.   CARDIAC REHAB PHASE II EXERCISE from 07/14/2018 in [place]  Date  07/14/18  Educator  D. Coad  Instruction Review Code  2- Demonstrated Understanding      Angina -Discuss definition of angina, causes of angina, treatment of angina, and how to decrease risk of having angina.   CARDIAC REHAB PHASE II EXERCISE from 07/14/2018 in [place]  Date  04/21/18  Educator  [place]  Instruction Review Code  2-  Demonstrated Understanding      Cardiac Medications -Review what the following cardiac medications are used for, how they affect the body, and side effects that may occur when taking the medications.  Medications include Aspirin, Beta blockers, calcium channel blockers, ACE Inhibitors, angiotensin receptor blockers, diuretics, digoxin, and antihyperlipidemics.   Congestive Heart Failure -Discuss the definition of CHF, how to live with CHF, the signs and symptoms of CHF, and how keep track of weight and sodium intake.   Heart Disease and Intimacy -Discus the effect sexual  activity has on the heart, how changes occur during intimacy as we age, and safety during sexual activity.   Smoking Cessation / COPD -Discuss different methods to quit smoking, the health benefits of quitting smoking, and the definition of COPD.   Nutrition I: Fats -Discuss the types of cholesterol, what cholesterol does to the heart, and how cholesterol levels can be controlled.   Nutrition II: Labels -Discuss the different components of food labels and how to read food label   CARDIAC REHAB PHASE II EXERCISE from 07/14/2018 in [place]  Date  05/26/18  Educator  DC  Instruction Review Code  2- Demonstrated Understanding      Heart Parts/Heart Disease and PAD -Discuss the anatomy of the heart, the pathway of blood circulation through the heart, and these are affected by heart disease.   CARDIAC REHAB PHASE II EXERCISE from 07/14/2018 in [place]  Date  06/09/18  Educator  DC  Instruction Review Code  2- Demonstrated Understanding      Stress I: Signs and Symptoms -Discuss the causes of stress, how stress may lead to anxiety and depression, and ways to limit stress.   Stress II: Relaxation -Discuss different types of relaxation techniques to limit stress.   Warning Signs of Stroke / TIA -Discuss definition of a stroke, what the signs and symptoms are  of a stroke, and how to identify when someone is having stroke.   Knowledge Questionnaire Score: Knowledge Questionnaire Score - 04/19/18 1130      Knowledge Questionnaire Score   Pre Score  22/24       Core Components/Risk Factors/Patient Goals at Admission: Personal Goals and Risk Factors at Admission - 04/19/18 1141      Core Components/Risk Factors/Patient Goals on Admission    Weight Management  Yes    Intervention  Weight Management/Obesity: Establish reasonable short term and long term weight goals.    Admit Weight  255 lb 6.4 oz (115.8 kg)    Goal Weight: Short Term  245 lb 6.4 oz (111.3 kg)    Goal Weight: Long Term  235 lb 6.4 oz (106.8 kg)    Expected Outcomes  Short Term: Continue to assess and modify interventions until short term weight is achieved;Long Term: Adherence to nutrition and physical activity/exercise program aimed toward attainment of established weight goal    Personal Goal Other  Yes    Personal Goal  Gain core strength, Lose 15 lbs, be able to stand and prep food in the kitchen.    Intervention  Attend CR 3 x week and supplement with 2 x week exercise at home.     Expected Outcomes  Achieve personal goals.        Core Components/Risk Factors/Patient Goals Review:  Goals and Risk Factor Review    Row Name 06/09/18 1301 07/07/18 1504           Core Components/Risk Factors/Patient Goals Review   Personal Goals Review  Weight Management/Obesity Increase core strength; lose wiehgt 15 lbs; be able to stand and cook.   Weight Management/Obesity Increase core strength; lose 15 lbs; be able to stand and cook.       Review  Patient has completed 10 sessions losing 4 lbs since she started the program. She transferred from another CR program in [place] where she completed 7 sessions. She is doing well in the program with some progression. She is not able to stand and cook but she says she does feel stronger  and has more stamina. Will continue to monitor for  progress.   Patient has completed 15 sessions gaining 5 lbs since last 30 day review. She continues to do well in the program with some progression. She is not able to stand and cook but she does feel more stamina. 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 She also feel recently making her knees worse. Will continue to monitor for progress.       Expected Outcomes  Patient will continue to attend sessions and complete the program meeting his personal needs.   Patient will continue to attend sessions and complete the program meeting his personal needs.          Core Components/Risk Factors/Patient Goals at Discharge (Final Review):  Goals and Risk Factor Review - 07/07/18 1504      Core Components/Risk Factors/Patient Goals Review   Personal Goals Review  Weight Management/Obesity   Increase core strength; lose 15 lbs; be able to stand and cook.    Review  Patient has completed 15 sessions gaining 5 lbs since last 30 day review. She continues to do well in the program with some progression. She is not able to stand and cook but she does feel more stamina. 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 She also feel recently making her knees worse. Will continue to monitor for progress.     Expected Outcomes  Patient will continue to attend sessions and complete the program meeting his personal needs.        ITP Comments: ITP Comments    Row Name 04/19/18 1135 05/13/18 1605 06/10/18 1509 07/26/18 1020     ITP Comments  Miss [place] is transferring from [place] due to moving to [place], [place]. She has completed 13 visit in [place] and will do her remaining 23 visits here at [place]. they have transferred her records and we have a signed referral. She will continue using the doctors in [place] to follow her care.   Patient was transferred from CR at  [facility] where she completed 13 sessions. She has completed 2 sessions with our CR. She has not attended in 3 weeks due a knee injury from a fall. Will continue to monitor.   Patient attended the Family Matters class with hospital chapian to discuss how this event has impacted their life.   Patient stopped attending after completing 17 sessions with our CR due to chronic back pain. MD will be notified.        Comments: Patient stopped coming to Cardiac Rehab on 07/14/2018 after completing 17 sessions due to chronic back pain. Called patient to remind them of the Cardiac Rehab policy that if they do not call or come for 3 consecutive visits that they would be discharged from the CR program. Doctor will be informed.

## 2018-07-26 NOTE — Progress Notes (Signed)
Discharge Progress Report  Patient Details  Name: Stacey [name] MRN: [id number] Date of Birth: December 27, 1945 Referring Provider:     [place] from 04/19/2018 in [facility]  Referring Provider  Dr. Chryl Heck       Number of Visits: 17  Reason for Discharge:  Early Exit:  Lack of attendance  Smoking History:  Social History   Tobacco Use  Smoking Status Former Smoker  . Packs/day: 2.00  . Years: 18.00  . Pack years: 36.00  . Types: Cigarettes  . Last attempt to quit: 12/01/1980  . Years since quitting: 37.6  Smokeless Tobacco Never Used    Diagnosis:  NSTEMI (non-ST elevated myocardial infarction) ([place])  Status post coronary artery stent placement  ADL UCSD:   Initial Exercise Prescription: Initial Exercise Prescription - 04/19/18 1000      Date of Initial Exercise RX and Referring Provider   Date  04/19/18    Referring Provider  Dr. Chryl Heck      NuStep   Level  1    SPM  48    Minutes  15    METs  1.8      Arm Ergometer   Level  1.5    Watts  15    RPM  15    Minutes  20    METs  2.2      Prescription Details   Frequency (times per week)  3    Duration  Progress to 30 minutes of continuous aerobic without signs/symptoms of physical distress      Intensity   THRR 40-80% of Max Heartrate  107-121-135    Ratings of Perceived Exertion  11-13    Perceived Dyspnea  0-4      Progression   Progression  Continue progressive overload as per policy without signs/symptoms or physical distress.      Resistance Training   Training Prescription  Yes    Weight  1    Reps  10-15       Discharge Exercise Prescription (Final Exercise Prescription Changes): Exercise Prescription Changes - 07/23/18 1400      Response to Exercise   Blood Pressure (Admit)  140/82    Blood Pressure (Exercise)  160/80    Blood Pressure (Exit)  140/80    Heart Rate (Admit)  100 bpm    Heart Rate (Exercise)  134 bpm    Heart Rate  (Exit)  100 bpm    Rating of Perceived Exertion (Exercise)  14    Comments  Patient has been out d/t back pain since 07/14/18    Duration  Progress to 30 minutes of  aerobic without signs/symptoms of physical distress    Intensity  THRR New   100-129-139     Progression   Progression  Continue to progress workloads to maintain intensity without signs/symptoms of physical distress.      Resistance Training   Training Prescription  Yes    Weight  2    Reps  10-15    Time  5 Minutes      NuStep   Level  2    SPM  93    Minutes  22    METs  1.4      Arm Ergometer   Level  2.3    Watts  11    RPM  50    Minutes  17    METs  1.8      Home Exercise Plan  Plans to continue exercise at  Home (comment)    Frequency  Add 2 additional days to program exercise sessions.    Initial Home Exercises Provided  06/21/18       Functional Capacity: 6 Minute Walk    Row Name 04/19/18 1005         6 Minute Walk   Phase  Initial     Distance  528 feet     Distance % Change  0 %     Distance Feet Change  0 ft     Walk Time  6 minutes     # of Rest Breaks  0     MPH  1     METS  1.7     RPE  17     VO2 Peak  0.73     Symptoms  No     Resting HR  76 bpm     Resting BP  128/72     Max Ex. HR  91 bpm     Max Ex. BP  164/78        Psychological, QOL, Others - Outcomes: PHQ 2/9: Depression screen PHQ 2/9 04/19/2018  Decreased Interest 1  Down, Depressed, Hopeless 1  PHQ - 2 Score 2  Altered sleeping 3  Tired, decreased energy 2  Change in appetite 3  Feeling bad or failure about yourself  0  Trouble concentrating 2  Moving slowly or fidgety/restless 1  Suicidal thoughts 0  PHQ-9 Score 13  Difficult doing work/chores Very difficult    Quality of Life: Quality of Life - 04/19/18 1013      Quality of Life Scores   Health/Function Pre  13.38 %    Socioeconomic Pre  26.4 %    Psych/Spiritual Pre  18 %    Family Pre  26 %    GLOBAL Pre  18.39 %       Personal  Goals: Goals established at orientation with interventions provided to work toward goal. Personal Goals and Risk Factors at Admission - 04/19/18 1141      Core Components/Risk Factors/Patient Goals on Admission    Weight Management  Yes    Intervention  Weight Management/Obesity: Establish reasonable short term and long term weight goals.    Admit Weight  255 lb 6.4 oz (115.8 kg)    Goal Weight: Short Term  245 lb 6.4 oz (111.3 kg)    Goal Weight: Long Term  235 lb 6.4 oz (106.8 kg)    Expected Outcomes  Short Term: Continue to assess and modify interventions until short term weight is achieved;Long Term: Adherence to nutrition and physical activity/exercise program aimed toward attainment of established weight goal    Personal Goal Other  Yes    Personal Goal  Gain core strength, Lose 15 lbs, be able to stand and prep food in the kitchen.    Intervention  Attend CR 3 x week and supplement with 2 x week exercise at home.     Expected Outcomes  Achieve personal goals.         Personal Goals Discharge: Goals and Risk Factor Review    Row Name 06/09/18 1301 07/07/18 1504           Core Components/Risk Factors/Patient Goals Review   Personal Goals Review  Weight Management/Obesity Increase core strength; lose wiehgt 15 lbs; be able to stand and cook.   Weight Management/Obesity Increase core strength; lose 15 lbs; be able to stand and cook.  Review  Patient has completed 10 sessions losing 4 lbs since she started the program. She transferred from another CR program in [place] where she completed 7 sessions. She is doing well in the program with some progression. She is not able to stand and cook but she says she does feel stronger and has more stamina. Will continue to monitor for progress.   Patient has completed 15 sessions gaining 5 lbs since last 30 day review. She continues to do well in the program with some progression. She is not able to stand and cook but she does feel more  stamina. 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 She also feel recently making her knees worse. Will continue to monitor for progress.       Expected Outcomes  Patient will continue to attend sessions and complete the program meeting his personal needs.   Patient will continue to attend sessions and complete the program meeting his personal needs.          Exercise Goals and Review: Exercise Goals    Row Name 04/19/18 1011             Exercise Goals   Increase Physical Activity  Yes       Intervention  Provide advice, education, support and counseling about physical activity/exercise needs.;Develop an individualized exercise prescription for aerobic and resistive training based on initial evaluation findings, risk stratification, comorbidities and participant's personal goals.       Expected Outcomes  Short Term: Attend rehab on a regular basis to increase amount of physical activity.       Increase Strength and Stamina  Yes       Intervention  Provide advice, education, support and counseling about physical activity/exercise needs.;Develop an individualized exercise prescription for aerobic and resistive training based on initial evaluation findings, risk stratification, comorbidities and participant's personal goals.       Expected Outcomes  Short Term: Increase workloads from initial exercise prescription for resistance, speed, and METs.;Long Term: Improve cardiorespiratory fitness, muscular endurance and strength as measured by increased METs and functional capacity (6MWT);Short Term: Perform resistance training exercises routinely during rehab and add in resistance training at home       Able to understand and use rate of perceived exertion (RPE) scale  Yes       Intervention  Provide education and explanation on how to use RPE scale       Expected Outcomes  Short Term: Able to use RPE daily in rehab to express subjective intensity  level;Long Term:  Able to use RPE to guide intensity level when exercising independently       Able to understand and use Dyspnea scale  Yes       Intervention  Provide education and explanation on how to use Dyspnea scale       Expected Outcomes  Short Term: Able to use Dyspnea scale daily in rehab to express subjective sense of shortness of breath during exertion;Long Term: Able to use Dyspnea scale to guide intensity level when exercising independently       Knowledge and understanding of Target Heart Rate Range (THRR)  Yes       Intervention  Provide education and explanation of THRR including how the numbers were predicted and where they are located for reference       Expected Outcomes  Short Term: Able to state/look up THRR;Long Term: Able to use THRR  to govern intensity when exercising independently;Short Term: Able to use daily as guideline for intensity in rehab       Able to check pulse independently  Yes       Intervention  Provide education and demonstration on how to check pulse in carotid and radial arteries.;Review the importance of being able to check your own pulse for safety during independent exercise       Expected Outcomes  Short Term: Able to explain why pulse checking is important during independent exercise;Long Term: Able to check pulse independently and accurately       Understanding of Exercise Prescription  Yes       Intervention  Provide education, explanation, and written materials on patient's individual exercise prescription       Expected Outcomes  Short Term: Able to explain program exercise prescription;Long Term: Able to explain home exercise prescription to exercise independently          Nutrition & Weight - Outcomes: Pre Biometrics - 04/19/18 1012      Pre Biometrics   Height  5' (1.524 m)    Weight  116.1 kg    Waist Circumference  52.3 inches    Hip Circumference  54.3 inches    Waist to Hip Ratio  0.96 %    BMI (Calculated)  49.99    Triceps  Skinfold  30 mm    % Body Fat  59.4 %    Grip Strength  47.53 kg    Flexibility  12.83 in    Single Leg Stand  0 seconds        Nutrition: Nutrition Therapy & Goals - 06/10/18 1508      Personal Nutrition Goals   Nutrition Goal  For heart healthy choices add >50% of whole grains, make half their plate fruits and vegetables. Discuss the difference between starchy vegetables and leafy greens, and how leafy vegetables provide fiber, helps maintain healthy weight, helps control blood glucose, and lowers cholesterol.  Discuss purchasing fresh or frozen vegetable to reduce sodium and not to add grease, fat or sugar. Consume <18oz of red meat per week. Consume lean cuts of meats and very little of meats high in sodium and nitrates such as pork and lunch meats. Discussed portion control for all food groups.      Additional Goals?  No      Intervention Plan   Intervention  Nutrition handout(s) given to patient.    Expected Outcomes  Short Term Goal: Understand basic principles of dietary content, such as calories, fat, sodium, cholesterol and nutrients.       Nutrition Discharge: Nutrition Assessments - 04/19/18 1141      MEDFICTS Scores   Pre Score  79       Education Questionnaire Score: Knowledge Questionnaire Score - 04/19/18 1130      Knowledge Questionnaire Score   Pre Score  22/24

## 2018-09-19 ENCOUNTER — Encounter (HOSPITAL_COMMUNITY): Payer: Self-pay | Admitting: Emergency Medicine

## 2018-09-19 ENCOUNTER — Inpatient Hospital Stay (HOSPITAL_COMMUNITY)
Admission: EM | Admit: 2018-09-19 | Discharge: 2018-09-21 | DRG: 291 | Disposition: A | Discharge status: Home Health | Payer: Medicare Other | Attending: Internal Medicine | Admitting: Internal Medicine

## 2018-09-19 ENCOUNTER — Other Ambulatory Visit: Payer: Self-pay

## 2018-09-19 ENCOUNTER — Emergency Department (HOSPITAL_COMMUNITY): Payer: Medicare Other

## 2018-09-19 DIAGNOSIS — I471 Supraventricular tachycardia: Secondary | ICD-10-CM | POA: Diagnosis present

## 2018-09-19 DIAGNOSIS — Z955 Presence of coronary angioplasty implant and graft: Secondary | ICD-10-CM | POA: Diagnosis not present

## 2018-09-19 DIAGNOSIS — I447 Left bundle-branch block, unspecified: Secondary | ICD-10-CM | POA: Diagnosis present

## 2018-09-19 DIAGNOSIS — I340 Nonrheumatic mitral (valve) insufficiency: Secondary | ICD-10-CM | POA: Diagnosis not present

## 2018-09-19 DIAGNOSIS — Z7902 Long term (current) use of antithrombotics/antiplatelets: Secondary | ICD-10-CM

## 2018-09-19 DIAGNOSIS — J90 Pleural effusion, not elsewhere classified: Secondary | ICD-10-CM

## 2018-09-19 DIAGNOSIS — I2510 Atherosclerotic heart disease of native coronary artery without angina pectoris: Secondary | ICD-10-CM | POA: Diagnosis present

## 2018-09-19 DIAGNOSIS — I509 Heart failure, unspecified: Secondary | ICD-10-CM

## 2018-09-19 DIAGNOSIS — C50919 Malignant neoplasm of unspecified site of unspecified female breast: Secondary | ICD-10-CM | POA: Diagnosis present

## 2018-09-19 DIAGNOSIS — R0902 Hypoxemia: Secondary | ICD-10-CM

## 2018-09-19 DIAGNOSIS — G8929 Other chronic pain: Secondary | ICD-10-CM | POA: Diagnosis present

## 2018-09-19 DIAGNOSIS — Z888 Allergy status to other drugs, medicaments and biological substances status: Secondary | ICD-10-CM | POA: Diagnosis not present

## 2018-09-19 DIAGNOSIS — Z7982 Long term (current) use of aspirin: Secondary | ICD-10-CM | POA: Diagnosis not present

## 2018-09-19 DIAGNOSIS — I5043 Acute on chronic combined systolic (congestive) and diastolic (congestive) heart failure: Secondary | ICD-10-CM | POA: Diagnosis present

## 2018-09-19 DIAGNOSIS — Z87891 Personal history of nicotine dependence: Secondary | ICD-10-CM

## 2018-09-19 DIAGNOSIS — J439 Emphysema, unspecified: Secondary | ICD-10-CM | POA: Diagnosis present

## 2018-09-19 DIAGNOSIS — I110 Hypertensive heart disease with heart failure: Principal | ICD-10-CM | POA: Diagnosis present

## 2018-09-19 DIAGNOSIS — Z6841 Body Mass Index (BMI) 40.0 and over, adult: Secondary | ICD-10-CM

## 2018-09-19 DIAGNOSIS — J9601 Acute respiratory failure with hypoxia: Secondary | ICD-10-CM | POA: Diagnosis present

## 2018-09-19 DIAGNOSIS — R0602 Shortness of breath: Secondary | ICD-10-CM

## 2018-09-19 DIAGNOSIS — I252 Old myocardial infarction: Secondary | ICD-10-CM | POA: Diagnosis not present

## 2018-09-19 DIAGNOSIS — E785 Hyperlipidemia, unspecified: Secondary | ICD-10-CM | POA: Diagnosis present

## 2018-09-19 DIAGNOSIS — J441 Chronic obstructive pulmonary disease with (acute) exacerbation: Secondary | ICD-10-CM

## 2018-09-19 DIAGNOSIS — C799 Secondary malignant neoplasm of unspecified site: Secondary | ICD-10-CM | POA: Diagnosis present

## 2018-09-19 HISTORY — DX: Chronic obstructive pulmonary disease, unspecified: J44.9

## 2018-09-19 LAB — COMPREHENSIVE METABOLIC PANEL
ALBUMIN: 3.5 g/dL (ref 3.5–5.0)
ALK PHOS: 59 U/L (ref 38–126)
ALT: 16 U/L (ref 0–44)
AST: 26 U/L (ref 15–41)
Anion gap: 10 (ref 5–15)
BILIRUBIN TOTAL: 2.2 mg/dL — AB (ref 0.3–1.2)
BUN: 16 mg/dL (ref 8–23)
CALCIUM: 8.9 mg/dL (ref 8.9–10.3)
CO2: 24 mmol/L (ref 22–32)
Chloride: 106 mmol/L (ref 98–111)
Creatinine, Ser: 0.54 mg/dL (ref 0.44–1.00)
GFR calc Af Amer: >60 mL/min (ref >60)
GFR calc non Af Amer: >60 mL/min (ref >60)
GLUCOSE: 111 mg/dL — AB (ref 70–99)
POTASSIUM: 4.2 mmol/L (ref 3.5–5.1)
SODIUM: 140 mmol/L (ref 135–145)
TOTAL PROTEIN: 6.4 g/dL — AB (ref 6.5–8.1)

## 2018-09-19 LAB — CBC WITH DIFFERENTIAL/PLATELET
Abs Immature Granulocytes: 0.02 10*3/uL (ref 0.00–0.07)
BASOS ABS: 0 10*3/uL (ref 0.0–0.1)
Basophils Relative: 1 %
Eosinophils Absolute: 0.1 10*3/uL (ref 0.0–0.5)
Eosinophils Relative: 3 %
HEMATOCRIT: 38.1 % (ref 36.0–46.0)
HEMOGLOBIN: 11.9 g/dL — AB (ref 12.0–15.0)
IMMATURE GRANULOCYTES: 0 %
LYMPHS ABS: 1.6 10*3/uL (ref 0.7–4.0)
LYMPHS PCT: 34 %
MCH: 30.4 pg (ref 26.0–34.0)
MCHC: 31.2 g/dL (ref 30.0–36.0)
MCV: 97.4 fL (ref 80.0–100.0)
Monocytes Absolute: 0.3 10*3/uL (ref 0.1–1.0)
Monocytes Relative: 7 %
NEUTROS ABS: 2.7 10*3/uL (ref 1.7–7.7)
NEUTROS PCT: 55 %
NRBC: 0 % (ref 0.0–0.2)
Platelets: 224 10*3/uL (ref 150–400)
RBC: 3.91 MIL/uL (ref 3.87–5.11)
RDW: 13.9 % (ref 11.5–15.5)
WBC: 4.9 10*3/uL (ref 4.0–10.5)

## 2018-09-19 LAB — BRAIN NATRIURETIC PEPTIDE: B Natriuretic Peptide: 368 pg/mL — ABNORMAL HIGH (ref 0.0–100.0)

## 2018-09-19 LAB — TROPONIN I: Troponin I: <0.03 ng/mL (ref <0.03)

## 2018-09-19 MED ORDER — IPRATROPIUM-ALBUTEROL 0.5-2.5 (3) MG/3ML IN SOLN
RESPIRATORY_TRACT | Status: AC
  Administered 2018-09-19: 3 mL via RESPIRATORY_TRACT
  Filled 2018-09-19: qty 3

## 2018-09-19 MED ORDER — GABAPENTIN 400 MG PO CAPS
800.0000 mg | ORAL_CAPSULE | Freq: Every day | ORAL | Status: DC
  Administered 2018-09-20: 800 mg via ORAL
  Filled 2018-09-19: qty 2

## 2018-09-19 MED ORDER — METHYLPREDNISOLONE SODIUM SUCC 125 MG IJ SOLR
125.0000 mg | Freq: Once | INTRAMUSCULAR | Status: AC
  Administered 2018-09-19: 125 mg via INTRAVENOUS
  Filled 2018-09-19: qty 2

## 2018-09-19 MED ORDER — ASPIRIN 81 MG PO CHEW
81.0000 mg | CHEWABLE_TABLET | Freq: Every day | ORAL | Status: DC
  Administered 2018-09-20: 81 mg via ORAL
  Filled 2018-09-19 (×2): qty 1

## 2018-09-19 MED ORDER — ENOXAPARIN SODIUM 40 MG/0.4ML ~~LOC~~ SOLN
40.0000 mg | SUBCUTANEOUS | Status: DC
  Administered 2018-09-20 – 2018-09-21 (×2): 40 mg via SUBCUTANEOUS
  Filled 2018-09-19 (×3): qty 0.4

## 2018-09-19 MED ORDER — UMECLIDINIUM BROMIDE 62.5 MCG/INH IN AEPB
1.0000 | INHALATION_SPRAY | Freq: Every day | RESPIRATORY_TRACT | Status: DC
  Filled 2018-09-19: qty 7

## 2018-09-19 MED ORDER — ADULT MULTIVITAMIN W/MINERALS CH
1.0000 | ORAL_TABLET | Freq: Every day | ORAL | Status: DC
  Administered 2018-09-20: 1 via ORAL
  Filled 2018-09-19 (×2): qty 1

## 2018-09-19 MED ORDER — FUROSEMIDE 10 MG/ML IJ SOLN
40.0000 mg | Freq: Every day | INTRAMUSCULAR | Status: DC
  Administered 2018-09-20 – 2018-09-21 (×2): 40 mg via INTRAVENOUS
  Filled 2018-09-19 (×3): qty 4

## 2018-09-19 MED ORDER — LETROZOLE 2.5 MG PO TABS
2.5000 mg | ORAL_TABLET | Freq: Every day | ORAL | Status: DC
  Administered 2018-09-20: 2.5 mg via ORAL
  Filled 2018-09-19 (×4): qty 1

## 2018-09-19 MED ORDER — IPRATROPIUM-ALBUTEROL 0.5-2.5 (3) MG/3ML IN SOLN
3.0000 mL | RESPIRATORY_TRACT | Status: AC
  Administered 2018-09-19 (×2): 3 mL via RESPIRATORY_TRACT
  Filled 2018-09-19: qty 6

## 2018-09-19 MED ORDER — ACETAMINOPHEN 325 MG PO TABS: 650.0000 mg | ORAL_TABLET | ORAL | Status: DC | PRN

## 2018-09-19 MED ORDER — ALBUTEROL SULFATE (2.5 MG/3ML) 0.083% IN NEBU
5.0000 mg | INHALATION_SOLUTION | Freq: Once | RESPIRATORY_TRACT | Status: DC
  Filled 2018-09-19: qty 6

## 2018-09-19 MED ORDER — METOPROLOL SUCCINATE ER 25 MG PO TB24
12.5000 mg | ORAL_TABLET | Freq: Every day | ORAL | Status: DC
  Filled 2018-09-19: qty 1

## 2018-09-19 MED ORDER — GABAPENTIN 400 MG PO CAPS
400.0000 mg | ORAL_CAPSULE | Freq: Two times a day (BID) | ORAL | Status: DC
  Administered 2018-09-20 – 2018-09-21 (×3): 400 mg via ORAL
  Filled 2018-09-19 (×5): qty 1

## 2018-09-19 MED ORDER — ATORVASTATIN CALCIUM 40 MG PO TABS
80.0000 mg | ORAL_TABLET | Freq: Every day | ORAL | Status: DC
  Administered 2018-09-20: 80 mg via ORAL
  Filled 2018-09-19 (×2): qty 2

## 2018-09-19 MED ORDER — SODIUM CHLORIDE 0.9% FLUSH
3.0000 mL | Freq: Two times a day (BID) | INTRAVENOUS | Status: DC
  Administered 2018-09-20 – 2018-09-21 (×4): 3 mL via INTRAVENOUS

## 2018-09-19 MED ORDER — ALBUTEROL SULFATE (2.5 MG/3ML) 0.083% IN NEBU
2.5000 mg | INHALATION_SOLUTION | RESPIRATORY_TRACT | Status: DC | PRN
  Administered 2018-09-20 (×2): 2.5 mg via RESPIRATORY_TRACT
  Filled 2018-09-19 (×2): qty 3

## 2018-09-19 MED ORDER — IOPAMIDOL (ISOVUE-370) INJECTION 76%
100.0000 mL | Freq: Once | INTRAVENOUS | Status: AC | PRN
  Administered 2018-09-19: 100 mL via INTRAVENOUS

## 2018-09-19 MED ORDER — B COMPLEX PO TABS: 1.0000 | ORAL_TABLET | Freq: Every day | ORAL | Status: DC

## 2018-09-19 MED ORDER — MOMETASONE FURO-FORMOTEROL FUM 200-5 MCG/ACT IN AERO
2.0000 | INHALATION_SPRAY | Freq: Two times a day (BID) | RESPIRATORY_TRACT | Status: DC
  Administered 2018-09-20 – 2018-09-21 (×2): 2 via RESPIRATORY_TRACT
  Filled 2018-09-19: qty 8.8

## 2018-09-19 MED ORDER — IPRATROPIUM-ALBUTEROL 0.5-2.5 (3) MG/3ML IN SOLN
3.0000 mL | Freq: Once | RESPIRATORY_TRACT | Status: AC
  Administered 2018-09-19: 3 mL via RESPIRATORY_TRACT

## 2018-09-19 MED ORDER — TIOTROPIUM BROMIDE MONOHYDRATE 18 MCG IN CAPS: 18.0000 ug | ORAL_CAPSULE | Freq: Every day | RESPIRATORY_TRACT | Status: DC

## 2018-09-19 MED ORDER — SODIUM CHLORIDE 0.9% FLUSH: 3.0000 mL | INTRAVENOUS | Status: DC | PRN

## 2018-09-19 MED ORDER — CLOPIDOGREL BISULFATE 75 MG PO TABS
75.0000 mg | ORAL_TABLET | Freq: Every day | ORAL | Status: DC
  Administered 2018-09-20: 75 mg via ORAL
  Filled 2018-09-19 (×2): qty 1

## 2018-09-19 MED ORDER — NITROGLYCERIN 0.4 MG SL SUBL: 0.4000 mg | SUBLINGUAL_TABLET | SUBLINGUAL | Status: DC | PRN

## 2018-09-19 MED ORDER — SODIUM CHLORIDE 0.9 % IV SOLN: 250.0000 mL | INTRAVENOUS | Status: DC | PRN

## 2018-09-19 MED ORDER — LOSARTAN POTASSIUM 50 MG PO TABS
50.0000 mg | ORAL_TABLET | Freq: Every day | ORAL | Status: DC
  Administered 2018-09-20: 50 mg via ORAL
  Filled 2018-09-19 (×2): qty 1

## 2018-09-19 MED ORDER — OXYCODONE HCL 5 MG PO TABS: 10.0000 mg | ORAL_TABLET | Freq: Four times a day (QID) | ORAL | Status: DC | PRN

## 2018-09-19 MED ORDER — OCUVITE-LUTEIN PO CAPS: 1.0000 | ORAL_CAPSULE | Freq: Every day | ORAL | Status: DC

## 2018-09-19 MED ORDER — FUROSEMIDE 10 MG/ML IJ SOLN
40.0000 mg | Freq: Once | INTRAMUSCULAR | Status: AC
  Administered 2018-09-19: 40 mg via INTRAVENOUS
  Filled 2018-09-19: qty 4

## 2018-09-19 MED ORDER — GABAPENTIN 400 MG PO CAPS: 400.0000 mg | ORAL_CAPSULE | ORAL | Status: DC

## 2018-09-19 MED ORDER — CALCIUM-PHOSPHORUS-VITAMIN D 250-107-500 MG-MG-UNIT PO CHEW: 2.0000 | CHEWABLE_TABLET | Freq: Two times a day (BID) | ORAL | Status: DC

## 2018-09-19 MED ORDER — ONDANSETRON HCL 4 MG/2ML IJ SOLN: 4.0000 mg | Freq: Four times a day (QID) | INTRAMUSCULAR | Status: DC | PRN

## 2018-09-19 MED ORDER — ALBUTEROL SULFATE (2.5 MG/3ML) 0.083% IN NEBU
2.5000 mg | INHALATION_SOLUTION | Freq: Once | RESPIRATORY_TRACT | Status: AC
  Administered 2018-09-19: 2.5 mg via RESPIRATORY_TRACT
  Filled 2018-09-19: qty 3

## 2018-09-19 NOTE — ED Triage Notes (Signed)
Patient c/o shortness of breath that started x1 week ago and is progressively getting worse. Patient  Denies any chest pain or fever. Per patient occasional productive cough that starts with thick green sputum and clears during the day. Patient states when she had an MI this is how it started. Patient just had CT on Tuesday that showed emphysema. Denies using any type of breathing treatment.

## 2018-09-19 NOTE — Plan of Care (Signed)
  Problem: Pain Managment: Goal: General experience of comfort will improve Outcome: Progressing   

## 2018-09-19 NOTE — ED Provider Notes (Signed)
[facility] EMERGENCY DEPARTMENT Provider Note   CSN: [id number] Arrival date & time: 09/19/18  1102     History   Chief Complaint Chief Complaint  Patient presents with  . Shortness of Breath    HPI [place] B Andress is a 72 y.o. female.   Shortness of Breath     Pt was seen at 1130.  Per pt, c/o gradual onset and worsening of persistent SOB for the past 1.5 weeks. Has been associated with cough, productive of "green" sputum. Pt states the SOB worsens on exertion and when she lays flat. Pt states she was evaluated by her Onc MD this past week and was told she "had emphysema" and was referred to a Pulm MD. Denies CP/palpitations, no back pain, no abd pain, no N/V/D, no fevers, no rash.    Past Medical History:  Diagnosis Date  . CAD in native artery    a.DES to ramus 2005 with late stent thrombosis 2006 tx with PTCA. 12/18 PCI/DES x1 to mRCA, EF 50-55%  . CHF (congestive heart failure) ([place])   . Chronic pain   . COPD (chronic obstructive pulmonary disease) ([place])   . Hyperlipidemia   . Hypertension   . LBBB (left bundle branch block)   . Left bundle branch block   . Lymphedema   . Metastatic breast cancer ([place])    a. to bone.  . MI (myocardial infarction) ([place])   . Mild aortic stenosis 10/2017  . Morbid obesity ([place])   . PSVT (paroxysmal supraventricular tachycardia) ([place])    a. per [facility] notes, seen on event monitor in 2014.  . Pulmonary nodules     Patient Active Problem List   Diagnosis Date Noted  . Dyspnea 11/30/2017  . Non-ST elevation (NSTEMI) myocardial infarction ([place])   . Hypertension 11/23/2017  . Left bundle branch block 11/23/2017  . Acute on chronic diastolic CHF (congestive heart failure) ([place]) 11/23/2017  . CAD (coronary artery disease) 11/23/2017  . Breast cancer ([place]) 11/23/2017  . Acute respiratory failure with hypoxia ([place]) 11/23/2017    Past Surgical History:  Procedure Laterality Date  . CORONARY STENT INTERVENTION N/A 11/26/2017   Procedure: CORONARY STENT INTERVENTION;  Surgeon: Martinique, Peter M, MD;  Location: [place] [facility];  Service: Cardiovascular;  Laterality: N/A;  . CORONARY STENT PLACEMENT    . LEFT HEART CATH AND CORONARY ANGIOGRAPHY N/A 11/26/2017   Procedure: LEFT HEART CATH AND CORONARY ANGIOGRAPHY;  Surgeon: Martinique, Peter M, MD;  Location: [place] [facility];  Service: Cardiovascular;  Laterality: N/A;     OB History   None      Home Medications    Prior to Admission medications   Medication Sig Start Date End Date Taking? Authorizing Provider  aspirin 81 MG chewable tablet Chew 1 tablet (81 mg total) by mouth daily. 11/27/17  Yes Reino Bellis B, NP  ATENOLOL PO Take 1 tablet by mouth daily.   Yes [provider]  atorvastatin (LIPITOR) 80 MG tablet Take 80 mg by mouth daily.   Yes [provider]  b complex vitamins tablet Take 1 tablet by mouth daily.   Yes [provider]  Calcium-Phosphorus-Vitamin D (CITRACAL +D3) 250-107-500 MG-MG-UNIT CHEW Chew 2 tablets by mouth 2 (two) times daily.   Yes [provider]  clopidogrel (PLAVIX) 75 MG tablet Take 1 tablet (75 mg total) by mouth daily. 12/03/17  Yes [facility], Tami Lin, PA  Coenzyme Q10 (CO Q 10 PO) Take 1 tablet by mouth daily.  Yes [provider]  gabapentin (NEURONTIN) 400 MG capsule Take 400-800 mg by mouth See admin instructions. Take 400mg  in the morning, 400mg  capsule in the afternoon, then take 800mg  at bedtime.   Yes [provider]  hydrochlorothiazide (HYDRODIURIL) 25 MG tablet Take 25 mg by mouth daily.   Yes [provider]  letrozole (FEMARA) 2.5 MG tablet Take 2.5 mg by mouth daily.   Yes [provider]  lidocaine-prilocaine (EMLA) cream Apply 1 application topically daily as needed. 03/11/18  Yes [provider]  losartan (COZAAR) 50 MG tablet Take 1 tablet (50 mg total) by mouth daily. 12/03/17  Yes [facility], Tami Lin, PA  metoprolol succinate  (TOPROL-XL) 25 MG 24 hr tablet Take 0.5 tablets by mouth daily. 03/08/18  Yes [provider]  Multiple Vitamin (MULTIVITAMIN WITH MINERALS) TABS tablet Take 1 tablet by mouth daily.   Yes [provider]  multivitamin-lutein (OCUVITE-LUTEIN) CAPS capsule Take 1 capsule by mouth daily.   Yes [provider]  nitroGLYCERIN (NITROSTAT) 0.4 MG SL tablet Place 1 tablet (0.4 mg total) under the tongue every 5 (five) minutes as needed. Patient taking differently: Place 0.4 mg under the tongue every 5 (five) minutes as needed for chest pain.  11/27/17  Yes Reino Bellis B, NP  Oxycodone HCl 10 MG TABS Take 10 mg by mouth every 6 (six) hours as needed (for pain).   Yes [provider]  Potassium 99 MG TABS Take 1 tablet by mouth daily.   Yes [provider]    Family History Family History  Problem Relation Age of Onset  . Sudden Cardiac Death Neg Hx     Social History Social History   Tobacco Use  . Smoking status: Former Smoker    Packs/day: 2.00    Years: 18.00    Pack years: 36.00    Types: Cigarettes    Last attempt to quit: 12/01/1980    Years since quitting: 37.8  . Smokeless tobacco: Never Used  Substance Use Topics  . Alcohol use: No    Frequency: Never  . Drug use: No     Allergies   Brilinta [ticagrelor]   Review of Systems Review of Systems  Respiratory: Positive for shortness of breath.   ROS: Statement: All systems negative except as marked or noted in the HPI; Constitutional: Negative for fever and chills. ; ; Eyes: Negative for eye pain, redness and discharge. ; ; ENMT: Negative for ear pain, hoarseness, nasal congestion, sinus pressure and sore throat. ; ; Cardiovascular: Negative for chest pain, palpitations, diaphoresis, and peripheral edema. ; ; Respiratory: +SOB, cough. Negative for wheezing and stridor. ; ; Gastrointestinal: Negative for nausea, vomiting, diarrhea, abdominal pain, blood in stool, hematemesis, jaundice  and rectal bleeding. . ; ; Genitourinary: Negative for dysuria, flank pain and hematuria. ; ; Musculoskeletal: Negative for back pain and neck pain. Negative for swelling and trauma.; ; Skin: Negative for pruritus, rash, abrasions, blisters, bruising and skin lesion.; ; Neuro: Negative for headache, lightheadedness and neck stiffness. Negative for weakness, altered level of consciousness, altered mental status, extremity weakness, paresthesias, involuntary movement, seizure and syncope.        Physical Exam Updated Vital Signs BP (!) 160/99 (BP Location: Left Arm)   Pulse (!) 103   Temp 98.4 F (36.9 C) (Oral)   Resp (!) 24   Ht 5\' 1"  (1.549 m)   Wt 113.9 kg   SpO2 96%   BMI 47.43 kg/m   Physical Exam  1135: Physical examination:  Nursing notes reviewed; Vital signs and O2 SAT reviewed;  Constitutional: Well developed, Well nourished, Well hydrated, In no acute distress; Head:  Normocephalic, atraumatic; Eyes: EOMI, PERRL, No scleral icterus; ENMT: Mouth and pharynx normal, Mucous membranes moist; Neck: Supple, Full range of motion, No lymphadenopathy; Cardiovascular: Tachycardic rate and rhythm, No gallop; Respiratory: Breath sounds diminished, clear & equal bilaterally, No wheezes.  Speaking full sentences with ease, Normal respiratory effort/excursion; Chest: Nontender, Movement normal; Abdomen: Soft, Nontender, Nondistended, Normal bowel sounds; Genitourinary: No CVA tenderness; Extremities: Peripheral pulses normal, No tenderness, No edema, +LLE foot and ankle edema pt states is chronic "from a botched surgery." No calf tenderness..; Neuro: AA&Ox3, Major CN grossly intact.  Speech clear. No gross focal motor or sensory deficits in extremities.; Skin: Color normal, Warm, Dry.   ED Treatments / Results  Labs (all labs ordered are listed, but only abnormal results are displayed)   EKG EKG Interpretation  Date/Time:  Sunday September 19 2018 11:14:21 EDT Ventricular Rate:  108 PR  Interval:    QRS Duration: 141 QT Interval:  384 QTC Calculation: 515 R Axis:   -49 Text Interpretation:  Sinus tachycardia Left axis deviation Left bundle branch block Baseline wander When compared with ECG of 12/02/2017 Rate faster Confirmed by Francine Graven [phone]) on 09/19/2018 12:05:32 PM   Radiology   Procedures Procedures (including critical care time)  Medications Ordered in ED Medications  ipratropium-albuterol (DUONEB) 0.5-2.5 (3) MG/3ML nebulizer solution 3 mL (3 mLs Nebulization Given 09/19/18 1145)  albuterol (PROVENTIL) (2.5 MG/3ML) 0.083% nebulizer solution 2.5 mg (2.5 mg Nebulization Given 09/19/18 1145)     Initial Impression / Assessment and Plan / ED Course  I have reviewed the triage vital signs and the nursing notes.  Pertinent labs & imaging results that were available during my care of the patient were reviewed by me and considered in my medical decision making (see chart for details).  MDM Reviewed: previous chart, nursing note and vitals Reviewed previous: labs and ECG Interpretation: labs, ECG, x-ray and CT scan Total time providing critical care: 30-74 minutes. This excludes time spent performing separately reportable procedures and services. Consults: admitting MD   CRITICAL CARE Performed by: Francine Graven Total critical care time: 35 minutes Critical care time was exclusive of separately billable procedures and treating other patients. Critical care was necessary to treat or prevent imminent or life-threatening deterioration. Critical care was time spent personally by me on the following activities: development of treatment plan with patient and/or surrogate as well as nursing, discussions with consultants, evaluation of patient's response to treatment, examination of patient, obtaining history from patient or surrogate, ordering and performing treatments and interventions, ordering and review of laboratory studies, ordering and review of  radiographic studies, pulse oximetry and re-evaluation of patient's condition.   Results for orders placed or performed during the hospital encounter of 09/19/18  Troponin I  Result Value Ref Range   Troponin I <0.03 <0.03 ng/mL  CBC with Differential  Result Value Ref Range   WBC 4.9 4.0 - 10.5 K/uL   RBC 3.91 3.87 - 5.11 MIL/uL   Hemoglobin 11.9 (L) 12.0 - 15.0 g/dL   HCT 38.1 36.0 - 46.0 %   MCV 97.4 80.0 - 100.0 fL   MCH 30.4 26.0 - 34.0 pg   MCHC 31.2 30.0 - 36.0 g/dL   RDW 13.9 11.5 - 15.5 %   Platelets 224 150 - 400 K/uL   nRBC 0.0 0.0 - 0.2 %  Neutrophils Relative % 55 %   Neutro Abs 2.7 1.7 - 7.7 K/uL   Lymphocytes Relative 34 %   Lymphs Abs 1.6 0.7 - 4.0 K/uL   Monocytes Relative 7 %   Monocytes Absolute 0.3 0.1 - 1.0 K/uL   Eosinophils Relative 3 %   Eosinophils Absolute 0.1 0.0 - 0.5 K/uL   Basophils Relative 1 %   Basophils Absolute 0.0 0.0 - 0.1 K/uL   Immature Granulocytes 0 %   Abs Immature Granulocytes 0.02 0.00 - 0.07 K/uL  Comprehensive metabolic panel  Result Value Ref Range   Sodium 140 135 - 145 mmol/L   Potassium 4.2 3.5 - 5.1 mmol/L   Chloride 106 98 - 111 mmol/L   CO2 24 22 - 32 mmol/L   Glucose, Bld 111 (H) 70 - 99 mg/dL   BUN 16 8 - 23 mg/dL   Creatinine, Ser 0.54 0.44 - 1.00 mg/dL   Calcium 8.9 8.9 - 10.3 mg/dL   Total Protein 6.4 (L) 6.5 - 8.1 g/dL   Albumin 3.5 3.5 - 5.0 g/dL   AST 26 15 - 41 U/L   ALT 16 0 - 44 U/L   Alkaline Phosphatase 59 38 - 126 U/L   Total Bilirubin 2.2 (H) 0.3 - 1.2 mg/dL   GFR calc non Af Amer >60 >60 mL/min   GFR calc Af Amer >60 >60 mL/min   Anion gap 10 5 - 15  Brain natriuretic peptide  Result Value Ref Range   B Natriuretic Peptide 368.0 (H) 0.0 - 100.0 pg/mL   Dg Chest 2 View Result Date: 09/19/2018 CLINICAL DATA:  Shortness of breath. EXAM: CHEST - 2 VIEW COMPARISON:  PA and lateral chest 11/30/2017. FINDINGS: There is cardiomegaly and vascular congestion. Small right pleural effusion basilar  atelectasis noted. No pneumothorax. Aortic atherosclerosis is seen. IMPRESSION: Small right pleural effusion and basilar atelectasis. Cardiomegaly mild vascular congestion. Atherosclerosis. Electronically Signed   By: Inge Rise M.D.   On: 09/19/2018 14:27   Ct Angio Chest Pe W/cm &/or Wo Cm Result Date: 09/19/2018 CLINICAL DATA:  Shortness of breath for 1 week, worsening. EXAM: CT ANGIOGRAPHY CHEST WITH CONTRAST TECHNIQUE: Multidetector CT imaging of the chest was performed using the standard protocol during bolus administration of intravenous contrast. Multiplanar CT image reconstructions and MIPs were obtained to evaluate the vascular anatomy. CONTRAST:  100 mL ISOVUE-370 IOPAMIDOL (ISOVUE-370) INJECTION 76% COMPARISON:  PA and lateral chest 11/30/2017. FINDINGS: Cardiovascular: No pulmonary embolus is identified. There is mild cardiomegaly. No pericardial effusion. Calcific aortic and coronary atherosclerosis noted. Mediastinum/Nodes: No enlarged mediastinal, hilar, or axillary lymph nodes. Thyroid gland, trachea, and esophagus demonstrate no significant findings. Lungs/Pleura: Trace left and small right pleural effusion are seen. Nodule in the right upper lobe measuring 0.8 cm on image 26 is identified. There is some dependent atelectasis. Upper Abdomen: Negative. Musculoskeletal: No lytic or sclerotic lesion. Mid and lower thoracic degenerative disease noted. Review of the MIP images confirms the above findings. IMPRESSION: Negative for pulmonary embolus. 0.8 cm right upper lobe pulmonary nodule. Non-contrast chest CT at 6-12 months is recommended. If the nodule is stable at time of repeat CT, then future CT at 18-24 months (from today's scan) is considered optional for low-risk patients, but is recommended for high-risk patients. This recommendation follows the consensus statement: Guidelines for Management of Incidental Pulmonary Nodules Detected on CT Images: From the Fleischner Society 2017;  Radiology 2017; 284:228-243. Small right and trace left pleural effusion. Cardiomegaly. Aortic Atherosclerosis (ICD10-I70.0) and Emphysema (  ICD10-J43.9). Electronically Signed   By: Inge Rise M.D.   On: 09/19/2018 14:32    1455:  Pt given short neb, ambulated. Sats dropped from 96% R/A to 79% R/A, HR increased to 130's and pt c/o increasing SOB.  IV solumedrol and 2 more nebs ordered. IV lasix given for elevated BNP and pleural effusions on CXR. CT-A without PE. Dx and testing d/w pt and family.  Questions answered.  Verb understanding, agreeable to admit. T/C returned from [facility] Dr. Jerilee Hoh, case discussed, including:  HPI, pertinent PM/SHx, VS/PE, dx testing, ED course and treatment:  Agreeable to come to ED for evaluation for admission.        Final Clinical Impressions(s) / ED Diagnoses   Final diagnoses:  None    ED Discharge Orders    None       Francine Graven, DO 09/22/18 1048

## 2018-09-19 NOTE — H&P (Signed)
History and Physical    Stacey [name] WFU:[id number] DOB: 24-May-1946 DOA: 09/19/2018  Referring MD/NP/PA: Francine Graven, EDP PCP: Glenda Chroman, MD  Patient coming from: Home  Chief Complaint: Shortness of breath, orthopnea  HPI: Stacey [name] is a 72 y.o. 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  She states that last week she had her 72-month staging CT scan for follow-up of breast cancer by her oncologist.  Was told at that time that she had emphysema and needed to follow-up with the pulmonologist.  She states that for the past 2 nights but especially worse last night she has been having increasing shortness of breath when she lies down and when she ambulates even small distances.  She has also noticed increase of lower extremity edema.  When she was ambulated in the emergency department her heart rate dropped into the 70s.  Sitting on room air she is 96%.  Admission is requested for further evaluation and management.  Past Medical/Surgical History: Past Medical History:  Diagnosis Date  . CAD in native artery    a.DES to ramus 2005 with late stent thrombosis 2006 tx with PTCA. 12/18 PCI/DES x1 to mRCA, EF 50-55%  . CHF (congestive heart failure) ([place])   . Chronic pain   . COPD (chronic obstructive pulmonary disease) ([place])   . Hyperlipidemia   . Hypertension   . LBBB (left bundle branch block)   . Left bundle branch block   . Lymphedema   . Metastatic breast cancer ([place])    a. to bone.  . MI (myocardial infarction) ([place])   . Mild aortic stenosis 10/2017  . Morbid obesity ([place])   . PSVT (paroxysmal supraventricular tachycardia) ([place])    a. per [facility] notes, seen on event monitor in 2014.  . Pulmonary nodules     Past Surgical History:  Procedure Laterality Date  . CORONARY STENT INTERVENTION N/A 11/26/2017   Procedure:  CORONARY STENT INTERVENTION;  Surgeon: Martinique, Peter M, MD;  Location: [facility];  Service: Cardiovascular;  Laterality: N/A;  . CORONARY STENT PLACEMENT    . LEFT HEART CATH AND CORONARY ANGIOGRAPHY N/A 11/26/2017   Procedure: LEFT HEART CATH AND CORONARY ANGIOGRAPHY;  Surgeon: Martinique, Peter M, MD;  Location: [facility];  Service: Cardiovascular;  Laterality: N/A;    Social History:  reports that she quit smoking about 37 years ago. Her smoking use included cigarettes. She has a 36.00 pack-year smoking history. She has never used smokeless tobacco. She reports that she does not drink alcohol or use drugs.  Allergies: Allergies  Allergen Reactions  . Brilinta [Ticagrelor] Other (See Comments)    Nausea and severe diarrhea- general weakness. Patient says does not want to take again    Family History:  Family History  Problem Relation Age of Onset  . Sudden Cardiac Death Neg Hx     Prior to Admission medications   Medication Sig Start Date End Date Taking? Authorizing Provider  aspirin 81 MG chewable tablet Chew 1 tablet (81 mg total) by mouth daily. 11/27/17  Yes Cheryln Manly, NP  atorvastatin (LIPITOR) 80 MG tablet Take 80 mg by mouth daily.   Yes [provider]  b complex vitamins tablet Take 1 tablet by mouth daily.   Yes [provider]  Calcium-Phosphorus-Vitamin D ([place] +D3) [phone] MG-MG-UNIT CHEW Chew  2 tablets by mouth 2 (two) times daily.   Yes [provider]  clopidogrel (PLAVIX) 75 MG tablet Take 1 tablet (75 mg total) by mouth daily. 12/03/17  Yes [facility], Tami Lin, PA  Coenzyme Q10 (CO Q 10 PO) Take 1 tablet by mouth daily.   Yes [provider]  gabapentin (NEURONTIN) 400 MG capsule Take 400-800 mg by mouth See admin instructions. Take 400mg  in the morning, 400mg  capsule in the afternoon, then take 800mg  at bedtime.   Yes [provider]  hydrochlorothiazide (HYDRODIURIL) 25 MG tablet Take 25 mg  by mouth daily.   Yes [provider]  letrozole (FEMARA) 2.5 MG tablet Take 2.5 mg by mouth daily.   Yes [provider]  lidocaine-prilocaine (EMLA) cream Apply 1 application topically daily as needed. 03/11/18  Yes [provider]  losartan (COZAAR) 50 MG tablet Take 1 tablet (50 mg total) by mouth daily. 12/03/17  Yes [facility], Tami Lin, PA  metoprolol succinate (TOPROL-XL) 25 MG 24 hr tablet Take 0.5 tablets by mouth daily. 03/08/18  Yes [provider]  Multiple Vitamin (MULTIVITAMIN WITH MINERALS) TABS tablet Take 1 tablet by mouth daily.   Yes [provider]  multivitamin-lutein (OCUVITE-LUTEIN) CAPS capsule Take 1 capsule by mouth daily.   Yes [provider]  nitroGLYCERIN (NITROSTAT) 0.4 MG SL tablet Place 1 tablet (0.4 mg total) under the tongue every 5 (five) minutes as needed. Patient taking differently: Place 0.4 mg under the tongue every 5 (five) minutes as needed for chest pain.  11/27/17  Yes Reino Bellis B, NP  Oxycodone HCl 10 MG TABS Take 10 mg by mouth every 6 (six) hours as needed (for pain).   Yes [provider]  Potassium 99 MG TABS Take 1 tablet by mouth daily.   Yes [provider]    Review of Systems:  Constitutional: Denies fever, chills, diaphoresis, appetite change and fatigue.  HEENT: Denies photophobia, eye pain, redness, hearing loss, ear pain, congestion, sore throat, rhinorrhea, sneezing, mouth sores, trouble swallowing, neck pain, neck stiffness and tinnitus.   Respiratory: Denies  chest tightness Cardiovascular: Denies chest pain, palpitations Gastrointestinal: Denies nausea, vomiting, abdominal pain, diarrhea, constipation, blood in stool and abdominal distention.  Genitourinary: Denies dysuria, urgency, frequency, hematuria, flank pain and difficulty urinating.  Endocrine: Denies: hot or cold intolerance, sweats, changes in hair or nails, polyuria, polydipsia. Musculoskeletal:  Denies myalgias, back pain, joint swelling, arthralgias and gait problem.  Skin: Denies pallor, rash and wound.  Neurological: Denies dizziness, seizures, syncope, weakness, light-headedness, numbness and headaches.  Hematological: Denies adenopathy. Easy bruising, personal or family bleeding history  Psychiatric/Behavioral: Denies suicidal ideation, mood changes, confusion, nervousness, sleep disturbance and agitation    Physical Exam: Vitals:   09/19/18 1119 09/19/18 1145 09/19/18 1458 09/19/18 1719  BP:    (!) 149/72  Pulse:    (!) 102  Resp:    20  Temp:      TempSrc:      SpO2:  96% 95% 100%  Weight: 113.9 kg     Height: 5\' 1"  (1.549 m)        Constitutional: NAD, calm, comfortable Eyes: PERRL, lids and conjunctivae normal ENMT: Mucous membranes are moist. Posterior pharynx clear of any exudate or lesions.Normal dentition.  Neck: normal, supple, no masses, no thyromegaly Respiratory: Bilateral crackles, minimal expiratory wheezes  cardiovascular: Regular rate and rhythm, no murmurs / rubs / gallops.  2+ pitting edema up to the thighs bilaterally 2+ pedal pulses. No  carotid bruits.  Abdomen: no tenderness, no masses palpated. No hepatosplenomegaly. Bowel sounds positive.  Musculoskeletal: no clubbing / cyanosis. No joint deformity upper and lower extremities. Good ROM, no contractures. Normal muscle tone.  Skin: no rashes, lesions, ulcers. No induration Neurologic: CN 2-12 grossly intact. Sensation intact, DTR normal. Strength 5/5 in all 4.  Psychiatric: Normal judgment and insight. Alert and oriented x 3. Normal mood.    Labs on Admission: I have personally reviewed the following labs and imaging studies  CBC: Recent Labs  Lab 09/19/18 1204  WBC 4.9  NEUTROABS 2.7  HGB 11.9*  HCT 38.1  MCV 97.4  PLT 829   Basic Metabolic Panel: Recent Labs  Lab 09/19/18 1204  NA 140  K 4.2  CL 106  CO2 24  GLUCOSE 111*  BUN 16  CREATININE 0.54  CALCIUM 8.9    GFR: Estimated Creatinine Clearance: 75.6 mL/min (by C-G formula based on SCr of 0.54 mg/dL). Liver Function Tests: Recent Labs  Lab 09/19/18 1204  AST 26  ALT 16  ALKPHOS 59  BILITOT 2.2*  PROT 6.4*  ALBUMIN 3.5   No results for input(s): LIPASE, AMYLASE in the last 168 hours. No results for input(s): AMMONIA in the last 168 hours. Coagulation Profile: No results for input(s): INR, PROTIME in the last 168 hours. Cardiac Enzymes: Recent Labs  Lab 09/19/18 1204  TROPONINI <0.03   BNP (last 3 results) No results for input(s): PROBNP in the last 8760 hours. HbA1C: No results for input(s): HGBA1C in the last 72 hours. CBG: No results for input(s): GLUCAP in the last 168 hours. Lipid Profile: No results for input(s): CHOL, HDL, LDLCALC, TRIG, CHOLHDL, LDLDIRECT in the last 72 hours. Thyroid Function Tests: No results for input(s): TSH, T4TOTAL, FREET4, T3FREE, THYROIDAB in the last 72 hours. Anemia Panel: No results for input(s): VITAMINB12, FOLATE, FERRITIN, TIBC, IRON, RETICCTPCT in the last 72 hours. Urine analysis:    Component Value Date/Time   COLORURINE YELLOW 07/14/2018 1404   APPEARANCEUR CLEAR 07/14/2018 1404   LABSPEC 1.018 07/14/2018 1404   PHURINE 6.0 07/14/2018 1404   GLUCOSEU NEGATIVE 07/14/2018 1404   HGBUR NEGATIVE 07/14/2018 1404   BILIRUBINUR NEGATIVE 07/14/2018 1404   KETONESUR NEGATIVE 07/14/2018 1404   PROTEINUR NEGATIVE 07/14/2018 1404   NITRITE NEGATIVE 07/14/2018 1404   LEUKOCYTESUR NEGATIVE 07/14/2018 1404   Sepsis Labs: @LABRCNTIP (procalcitonin:4,lacticidven:4) )No results found for this or any previous visit (from the past 240 hour(s)).   Radiological Exams on Admission: Dg Chest 2 View  Result Date: 09/19/2018 CLINICAL DATA:  Shortness of breath. EXAM: CHEST - 2 VIEW COMPARISON:  PA and lateral chest 11/30/2017. FINDINGS: There is cardiomegaly and vascular congestion. Small right pleural effusion basilar atelectasis noted. No  pneumothorax. Aortic atherosclerosis is seen. IMPRESSION: Small right pleural effusion and basilar atelectasis. Cardiomegaly mild vascular congestion. Atherosclerosis. Electronically Signed   By: Inge Rise M.D.   On: 09/19/2018 14:27   Ct Angio Chest Pe W/cm &/or Wo Cm  Result Date: 09/19/2018 CLINICAL DATA:  Shortness of breath for 1 week, worsening. EXAM: CT ANGIOGRAPHY CHEST WITH CONTRAST TECHNIQUE: Multidetector CT imaging of the chest was performed using the standard protocol during bolus administration of intravenous contrast. Multiplanar CT image reconstructions and MIPs were obtained to evaluate the vascular anatomy. CONTRAST:  100 mL ISOVUE-370 IOPAMIDOL (ISOVUE-370) INJECTION 76% COMPARISON:  PA and lateral chest 11/30/2017. FINDINGS: Cardiovascular: No pulmonary embolus is identified. There is mild cardiomegaly. No pericardial effusion. Calcific aortic and coronary atherosclerosis noted. Mediastinum/Nodes: No  enlarged mediastinal, hilar, or axillary lymph nodes. Thyroid gland, trachea, and esophagus demonstrate no significant findings. Lungs/Pleura: Trace left and small right pleural effusion are seen. Nodule in the right upper lobe measuring 0.8 cm on image 26 is identified. There is some dependent atelectasis. Upper Abdomen: Negative. Musculoskeletal: No lytic or sclerotic lesion. Mid and lower thoracic degenerative disease noted. Review of the MIP images confirms the above findings. IMPRESSION: Negative for pulmonary embolus. 0.8 cm right upper lobe pulmonary nodule. Non-contrast chest CT at 6-12 months is recommended. If the nodule is stable at time of repeat CT, then future CT at 18-24 months (from today's scan) is considered optional for low-risk patients, but is recommended for high-risk patients. This recommendation follows the consensus statement: Guidelines for Management of Incidental Pulmonary Nodules Detected on CT Images: From the Fleischner Society 2017; Radiology 2017;  284:228-243. Small right and trace left pleural effusion. Cardiomegaly. Aortic Atherosclerosis (ICD10-I70.0) and Emphysema (ICD10-J43.9). Electronically Signed   By: Inge Rise M.D.   On: 09/19/2018 14:32    EKG: Independently reviewed.  Sinus tachycardia at a rate of 108 with a right bundle branch block  Assessment/Plan Principal Problem:   Acute hypoxemic respiratory failure (HCC) Active Problems:   Acute on chronic combined systolic and diastolic CHF (congestive heart failure) (HCC)   COPD with acute exacerbation (HCC)   CAD (coronary artery disease)   Breast cancer (HCC)    Acute hypoxemic respiratory failure -Suspect this is due to a combination of acute on chronic combined heart failure in addition to COPD that has not been diagnosed. -Provide oxygen supplementation as necessary, suspect she will need oxygen upon discharge. -Please see below for details.  Acute on chronic combined heart failure -Echo from January shows an ejection fraction of 30 to 35% as well as grade 1 diastolic dysfunction. -Will update 2D echo. -She is already on beta-blocker and ACE inhibitor. -Lasix 40 mg IV daily, strict intake and output, strive for negative fluid balance.  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 -She used to be a 2 to 3 pack a day smoker but quit about 15 years ago. -Would benefit from formal PFTs once discharged and over acute exacerbation for formal diagnosis. -Do not believe she needs systemic steroids at this time given only minimal wheezing, will start treatment with LABA/ICS/LAMA.  Breast cancer -Follow-up with oncology as scheduled.  Morbid obesity -Noted, counseled on lifestyle modifications.  Coronary artery disease status post MI -Stable, no chest pain, continue home medications.   DVT prophylaxis: Lovenox Code  Status: Full code Family Communication: Son at bedside updated on plan of care and all questions answered Disposition Plan: Home once medically stable, anticipate at least 48 to 72 hours Consults called: None Admission status: Admit - It is my clinical opinion that admission to INPATIENT is reasonable and necessary because of the expectation that this patient will require hospital care that crosses at least 2 midnights to treat this condition based on the medical complexity of the problems presented.  Given the aforementioned information, the predictability of an adverse outcome is felt to be significant.      Time Spent: 85 minutes  Estela Isaac Bliss MD [facility] Pager [phone]  If 7PM-7AM, please contact night-coverage [email or web address] Password [email or web address]  09/19/2018, 5:30 PM

## 2018-09-19 NOTE — ED Notes (Signed)
Pt ambulated per Dr. Thurnell Garbe.   Pt initial o2 was 90%. Pt o2 dropped to 79% then rose to 88% at end of ambulating.   Pt initial Hr was 103. HR rose to 138.   Pt gait was unsteady,. Pt usually walked with rolling walker at home. Pt c/o SOB and chest pain.

## 2018-09-19 NOTE — ED Notes (Signed)
O2 stat dropped when pt is up walking

## 2018-09-20 ENCOUNTER — Inpatient Hospital Stay (HOSPITAL_COMMUNITY): Payer: Medicare Other

## 2018-09-20 DIAGNOSIS — I340 Nonrheumatic mitral (valve) insufficiency: Secondary | ICD-10-CM

## 2018-09-20 LAB — ECHOCARDIOGRAM COMPLETE
HEIGHTINCHES: 61 in
WEIGHTICAEL: 3978.86 [oz_av]

## 2018-09-20 LAB — BASIC METABOLIC PANEL
ANION GAP: 12 (ref 5–15)
BUN: 17 mg/dL (ref 8–23)
CALCIUM: 9 mg/dL (ref 8.9–10.3)
CO2: 25 mmol/L (ref 22–32)
Chloride: 104 mmol/L (ref 98–111)
Creatinine, Ser: 0.66 mg/dL (ref 0.44–1.00)
GFR calc non Af Amer: >60 mL/min (ref >60)
Glucose, Bld: 160 mg/dL — ABNORMAL HIGH (ref 70–99)
Potassium: 3.7 mmol/L (ref 3.5–5.1)
SODIUM: 141 mmol/L (ref 135–145)

## 2018-09-20 MED ORDER — UMECLIDINIUM BROMIDE 62.5 MCG/INH IN AEPB
1.0000 | INHALATION_SPRAY | Freq: Every day | RESPIRATORY_TRACT | Status: DC
  Administered 2018-09-21: 1 via RESPIRATORY_TRACT
  Filled 2018-09-20: qty 7

## 2018-09-20 MED ORDER — CLOPIDOGREL BISULFATE 75 MG PO TABS
75.0000 mg | ORAL_TABLET | Freq: Every day | ORAL | Status: DC
  Administered 2018-09-20: 75 mg via ORAL
  Filled 2018-09-20: qty 1

## 2018-09-20 MED ORDER — LOSARTAN POTASSIUM 50 MG PO TABS
50.0000 mg | ORAL_TABLET | Freq: Every day | ORAL | Status: DC
  Administered 2018-09-20: 50 mg via ORAL
  Filled 2018-09-20: qty 1

## 2018-09-20 MED ORDER — LETROZOLE 2.5 MG PO TABS
2.5000 mg | ORAL_TABLET | Freq: Every day | ORAL | Status: DC
  Administered 2018-09-20: 2.5 mg via ORAL
  Filled 2018-09-20 (×2): qty 1

## 2018-09-20 MED ORDER — ATORVASTATIN CALCIUM 40 MG PO TABS
80.0000 mg | ORAL_TABLET | Freq: Every day | ORAL | Status: DC
  Administered 2018-09-20: 80 mg via ORAL
  Filled 2018-09-20: qty 2

## 2018-09-20 MED ORDER — ADULT MULTIVITAMIN W/MINERALS CH
1.0000 | ORAL_TABLET | Freq: Every day | ORAL | Status: DC
  Administered 2018-09-21: 1 via ORAL
  Filled 2018-09-20: qty 1

## 2018-09-20 MED ORDER — GABAPENTIN 400 MG PO CAPS
800.0000 mg | ORAL_CAPSULE | Freq: Every day | ORAL | Status: DC
  Administered 2018-09-20: 800 mg via ORAL

## 2018-09-20 MED ORDER — ASPIRIN 81 MG PO CHEW
81.0000 mg | CHEWABLE_TABLET | Freq: Every day | ORAL | Status: DC
  Administered 2018-09-20: 81 mg via ORAL
  Filled 2018-09-20: qty 1

## 2018-09-20 MED ORDER — METOPROLOL SUCCINATE ER 25 MG PO TB24
12.5000 mg | ORAL_TABLET | Freq: Every day | ORAL | Status: DC
  Administered 2018-09-20: 12.5 mg via ORAL
  Filled 2018-09-20: qty 1

## 2018-09-20 NOTE — Progress Notes (Signed)
PROGRESS NOTE    [name]  YKD:[id number] DOB: [birth year] DOA: 09/19/2018 PCP: Glenda Chroman, MD     Brief Narrative:  72 year old woman admitted from home on 10/20 due to shortness of breath.  She was found to have acute on chronic combined heart failure as well as presumed COPD with mild exacerbation and admission was requested.   Assessment & Plan:   Principal Problem:   Acute hypoxemic respiratory failure (HCC) Active Problems:   Acute on chronic combined systolic and diastolic CHF (congestive heart failure) (HCC)   COPD with acute exacerbation (HCC)   CAD (coronary artery disease)   Breast cancer (HCC)   Acute hypoxemic respiratory failure -Suspect this is due to a combination of her acute on chronic combined CHF in addition to COPD that has not been formally diagnosed. -Provide oxygen supplementation as necessary, suspect she will need oxygen upon discharge I will need a formal O2 assessment when she is ready to go home. -Please see below for further details.  Acute on chronic combined heart failure -Echo from January 2019 with an ejection fraction of 30 to 35% and grade 1 diastolic dysfunction. -We will update 2D echo, this has been done but results are currently pending. -Continue beta-blocker and ACE inhibitor. -Continue Lasix 40 mg IV daily, strive for negative fluid balance, intake and output does not appear to be documented appropriately.  Presumed COPD with mild acute exacerbation -Patient has never been formally diagnosed as having COPD, however reviewing CT scans all the way back to 2017 show emphysema. -She used to be a 2 to 3 pack a day smoker but quit about 15 years ago. -Would definitely benefit from formal PFTs once discharge and over acute exacerbation for formal COPD diagnosis. -Do not believe she needs systemic steroids at this time, she has been however started on optimal COPD management including LABA/ICS/L AMA.  Breast cancer -Follow-up with  oncology as scheduled.  Morbid obesity -Noted, counseled on lifestyle modifications.  Coronary artery disease status post MI -Stable, no chest pain, continue home meds.   DVT prophylaxis: Lovenox Code Status: Full code Family Communication: Patient only Disposition Plan: Home when medically stable, anticipate another 24 to 48 hours  Consultants:   None  Procedures:   2D echo pending  Antimicrobials:  Anti-infectives (From admission, onward)   None       Subjective: Sitting in bed, no complaints, has many questions about usage of her inhalers and oxygen.  Objective: Vitals:   09/19/18 2155 09/20/18 0036 09/20/18 0700 09/20/18 1416  BP: 122/70  130/60 137/86  Pulse: (!) 105  (!) 105 (!) 111  Resp: 19  20 18   Temp: 98.6 F (37 C)  97.6 F (36.4 C)   TempSrc: Oral  Oral   SpO2: 95% 96% 96% 97%  Weight:   112.8 kg   Height:        Intake/Output Summary (Last 24 hours) at 09/20/2018 1547 Last data filed at 09/20/2018 1519 Gross per 24 hour  Intake 243 ml  Output 3975 ml  Net -3732 ml   Filed Weights   09/19/18 1119 09/20/18 0700  Weight: 113.9 kg 112.8 kg    Examination:  General exam: Alert, awake, oriented x 3, morbidly obese Respiratory system: Mild bibasilar crackles. Respiratory effort normal. Cardiovascular system:RRR. No murmurs, rubs, gallops. Gastrointestinal system: Abdomen is obese, nondistended, soft and nontender. No organomegaly or masses felt. Normal bowel sounds heard. Central nervous system: Alert and oriented. No focal neurological deficits. Extremities: 1+  pitting edema bilaterally, +pedal pulses Skin: No rashes, lesions or ulcers Psychiatry: Judgement and insight appear normal. Mood & affect appropriate.     Data Reviewed: I have personally reviewed following labs and imaging studies  CBC: Recent Labs  Lab 09/19/18 1204  WBC 4.9  NEUTROABS 2.7  HGB 11.9*  HCT 38.1  MCV 97.4  PLT 500   Basic Metabolic Panel: Recent  Labs  Lab 09/19/18 1204 09/20/18 0629  NA 140 141  K 4.2 3.7  CL 106 104  CO2 24 25  GLUCOSE 111* 160*  BUN 16 17  CREATININE 0.54 0.66  CALCIUM 8.9 9.0   GFR: Estimated Creatinine Clearance: 75.1 mL/min (by C-G formula based on SCr of 0.66 mg/dL). Liver Function Tests: Recent Labs  Lab 09/19/18 1204  AST 26  ALT 16  ALKPHOS 59  BILITOT 2.2*  PROT 6.4*  ALBUMIN 3.5   No results for input(s): LIPASE, AMYLASE in the last 168 hours. No results for input(s): AMMONIA in the last 168 hours. Coagulation Profile: No results for input(s): INR, PROTIME in the last 168 hours. Cardiac Enzymes: Recent Labs  Lab 09/19/18 1204  TROPONINI <0.03   BNP (last 3 results) No results for input(s): PROBNP in the last 8760 hours. HbA1C: No results for input(s): HGBA1C in the last 72 hours. CBG: No results for input(s): GLUCAP in the last 168 hours. Lipid Profile: No results for input(s): CHOL, HDL, LDLCALC, TRIG, CHOLHDL, LDLDIRECT in the last 72 hours. Thyroid Function Tests: No results for input(s): TSH, T4TOTAL, FREET4, T3FREE, THYROIDAB in the last 72 hours. Anemia Panel: No results for input(s): VITAMINB12, FOLATE, FERRITIN, TIBC, IRON, RETICCTPCT in the last 72 hours. Urine analysis:    Component Value Date/Time   COLORURINE YELLOW 07/14/2018 1404   APPEARANCEUR CLEAR 07/14/2018 1404   LABSPEC 1.018 07/14/2018 1404   PHURINE 6.0 07/14/2018 1404   GLUCOSEU NEGATIVE 07/14/2018 1404   HGBUR NEGATIVE 07/14/2018 1404   BILIRUBINUR NEGATIVE 07/14/2018 1404   KETONESUR NEGATIVE 07/14/2018 1404   PROTEINUR NEGATIVE 07/14/2018 1404   NITRITE NEGATIVE 07/14/2018 1404   LEUKOCYTESUR NEGATIVE 07/14/2018 1404   Sepsis Labs: @LABRCNTIP (procalcitonin:4,lacticidven:4)  )No results found for this or any previous visit (from the past 240 hour(s)).       Radiology Studies: Dg Chest 2 View  Result Date: 09/19/2018 CLINICAL DATA:  Shortness of breath. EXAM: CHEST - 2 VIEW  COMPARISON:  PA and lateral chest 11/30/2017. FINDINGS: There is cardiomegaly and vascular congestion. Small right pleural effusion basilar atelectasis noted. No pneumothorax. Aortic atherosclerosis is seen. IMPRESSION: Small right pleural effusion and basilar atelectasis. Cardiomegaly mild vascular congestion. Atherosclerosis. Electronically Signed   By: Inge Rise M.D.   On: 09/19/2018 14:27   Ct Angio Chest Pe W/cm &/or Wo Cm  Result Date: 09/19/2018 CLINICAL DATA:  Shortness of breath for 1 week, worsening. EXAM: CT ANGIOGRAPHY CHEST WITH CONTRAST TECHNIQUE: Multidetector CT imaging of the chest was performed using the standard protocol during bolus administration of intravenous contrast. Multiplanar CT image reconstructions and MIPs were obtained to evaluate the vascular anatomy. CONTRAST:  100 mL ISOVUE-370 IOPAMIDOL (ISOVUE-370) INJECTION 76% COMPARISON:  PA and lateral chest 11/30/2017. FINDINGS: Cardiovascular: No pulmonary embolus is identified. There is mild cardiomegaly. No pericardial effusion. Calcific aortic and coronary atherosclerosis noted. Mediastinum/Nodes: No enlarged mediastinal, hilar, or axillary lymph nodes. Thyroid gland, trachea, and esophagus demonstrate no significant findings. Lungs/Pleura: Trace left and small right pleural effusion are seen. Nodule in the right upper lobe measuring 0.8 cm on  image 26 is identified. There is some dependent atelectasis. Upper Abdomen: Negative. Musculoskeletal: No lytic or sclerotic lesion. Mid and lower thoracic degenerative disease noted. Review of the MIP images confirms the above findings. IMPRESSION: Negative for pulmonary embolus. 0.8 cm right upper lobe pulmonary nodule. Non-contrast chest CT at 6-12 months is recommended. If the nodule is stable at time of repeat CT, then future CT at 18-24 months (from today's scan) is considered optional for low-risk patients, but is recommended for high-risk patients. This recommendation follows  the consensus statement: Guidelines for Management of Incidental Pulmonary Nodules Detected on CT Images: From the Fleischner Society 2017; Radiology 2017; 284:228-243. Small right and trace left pleural effusion. Cardiomegaly. Aortic Atherosclerosis (ICD10-I70.0) and Emphysema (ICD10-J43.9). Electronically Signed   By: Inge Rise M.D.   On: 09/19/2018 14:32        Scheduled Meds: . [START ON 09/21/2018] aspirin  81 mg Oral Daily  . [START ON 09/21/2018] atorvastatin  80 mg Oral Daily  . [START ON 09/21/2018] clopidogrel  75 mg Oral Daily  . enoxaparin (LOVENOX) injection  40 mg Subcutaneous Q24H  . furosemide  40 mg Intravenous Daily  . gabapentin  400 mg Oral BID  . [START ON 09/21/2018] gabapentin  800 mg Oral QHS  . [START ON 09/21/2018] letrozole  2.5 mg Oral Daily  . [START ON 09/21/2018] losartan  50 mg Oral Daily  . [START ON 09/21/2018] metoprolol succinate  12.5 mg Oral Daily  . mometasone-formoterol  2 puff Inhalation BID  . [START ON 09/21/2018] multivitamin with minerals  1 tablet Oral Daily  . sodium chloride flush  3 mL Intravenous Q12H  . [START ON 09/21/2018] umeclidinium bromide  1 puff Inhalation Daily   Continuous Infusions: . sodium chloride       LOS: 1 day    Time spent: 35 minutes. Greater than 50% of this time was spent in direct contact with the patient, coordinating care and discussing relevant ongoing clinical issues, including adequate management of her heart failure and COPD, instruction on proper use of inhalers.     Lelon Frohlich, MD [facility] Pager [phone]  If 7PM-7AM, please contact night-coverage [email or web address] Password [email or web address] 09/20/2018, 3:47 PM

## 2018-09-20 NOTE — Progress Notes (Signed)
Received verbal order from MD to administer Lasix injection 40 mg stat. Lasix administered as ordered.

## 2018-09-20 NOTE — Progress Notes (Signed)
*  PRELIMINARY RESULTS* Echocardiogram 2D Echocardiogram has been performed.  Leavy Cella 09/20/2018, 3:29 PM

## 2018-09-20 NOTE — Care Management (Signed)
Pt. called insurance company while I was in the room.  Spiriva AND  Dulera, advair OR Symbicort either  the pt.  insurance will not  cover and the alternate inhaler's are $160.00 -$400.00 She has [employer].Ph#. [phone]. She spoke to [facility]. Ms. Savoia ID# is [id number]. Also she has a dedutible of $365.00 which has not been met.

## 2018-09-20 NOTE — Care Management Note (Signed)
Case Management Note  Patient Details  Name: Stacey [name] MRN: [id number] Date of Birth: 24-Feb-1946  Subjective/Objective:    Admitted with CHF/COPD. Pt from home, lives alone, has strong family and community support. Pt uses canes with ambulation. Has walker if she needs it. She is not active with [place] [facility]. She has been to SNF in the past and will never been back. She does not have home oxygen or neb machine. Pt says she can not afford inhalers, she bases this off of the price her husband had to pay for his. Pt is not on oxygen in room but reports desaturating with ambulating.                 Action/Plan: DC will cont to follow. Anticipate DC home with [facility] and DME needs. Home O2 assessment will be done prior to DC, pt will need neb machine. CM has requested benefits check for Spiriva AND Dulera, Advair, OR Symbicort (per MD).  Expected Discharge Date:      09/21/2018            Expected Discharge Plan:  [place]  In-House Referral:  NA  Discharge planning Services  CM Consult  Post Acute Care Choice:  Home Health Choice offered to:  Patient  Status of Service:  In process, will continue to follow  If discussed at Long Length of Stay Meetings, dates discussed:    Additional Comments:  Sherald Barge, RN 09/20/2018, 1:19 PM

## 2018-09-21 LAB — BASIC METABOLIC PANEL
Anion gap: 10 (ref 5–15)
BUN: 21 mg/dL (ref 8–23)
CHLORIDE: 105 mmol/L (ref 98–111)
CO2: 26 mmol/L (ref 22–32)
Calcium: 8.7 mg/dL — ABNORMAL LOW (ref 8.9–10.3)
Creatinine, Ser: 0.74 mg/dL (ref 0.44–1.00)
GFR calc Af Amer: >60 mL/min (ref >60)
GLUCOSE: 113 mg/dL — AB (ref 70–99)
Potassium: 3.4 mmol/L — ABNORMAL LOW (ref 3.5–5.1)
SODIUM: 141 mmol/L (ref 135–145)

## 2018-09-21 MED ORDER — IPRATROPIUM BROMIDE 0.02 % IN SOLN: 0.5000 mg | Freq: Four times a day (QID) | RESPIRATORY_TRACT | 12 refills | Status: DC

## 2018-09-21 MED ORDER — POTASSIUM CHLORIDE ER 20 MEQ PO TBCR: 10.0000 meq | EXTENDED_RELEASE_TABLET | Freq: Every day | ORAL | 2 refills | Status: DC

## 2018-09-21 MED ORDER — ALBUTEROL SULFATE (2.5 MG/3ML) 0.083% IN NEBU: 2.5000 mg | INHALATION_SOLUTION | RESPIRATORY_TRACT | 12 refills | Status: DC | PRN

## 2018-09-21 MED ORDER — FUROSEMIDE 40 MG PO TABS: 40.0000 mg | ORAL_TABLET | Freq: Every day | ORAL | 11 refills | Status: DC

## 2018-09-21 MED ORDER — ALBUTEROL SULFATE HFA 108 (90 BASE) MCG/ACT IN AERS: 2.0000 | INHALATION_SPRAY | Freq: Four times a day (QID) | RESPIRATORY_TRACT | 2 refills | Status: DC | PRN

## 2018-09-21 MED ORDER — BUDESONIDE 0.25 MG/2ML IN SUSP: 0.2500 mg | Freq: Two times a day (BID) | RESPIRATORY_TRACT | 11 refills | Status: DC

## 2018-09-21 NOTE — Discharge Summary (Signed)
Physician Discharge Summary  Stacey [name] GYI:[id number] DOB: Apr 11, 1946 DOA: 09/19/2018  PCP: Glenda Chroman, MD  Admit date: 09/19/2018 Discharge date: 09/21/2018  Time spent: 45 minutes  Recommendations for Outpatient Follow-up:  -To be discharged home today. -Advise follow-up with PCP in 2 weeks.  Discharge Diagnoses:  Principal Problem:   Acute hypoxemic respiratory failure (HCC) Active Problems:   Acute on chronic combined systolic and diastolic CHF (congestive heart failure) (HCC)   COPD with acute exacerbation (HCC)   CAD (coronary artery disease)   Breast cancer ([place])   Discharge Condition: Stable and improved  Filed Weights   09/19/18 1119 09/20/18 0700 09/21/18 0605  Weight: 113.9 kg 112.8 kg 113.2 kg    History of present illness:  [name] is a 72 y.o. 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  She states that last week she had her 71-month staging CT scan for follow-up of breast cancer by her oncologist.  Was told at that time that she had emphysema and needed to follow-up with the pulmonologist.  She states that for the past 2 nights but especially worse last night she has been having increasing shortness of breath when she lies down and when she ambulates even small distances.  She has also noticed increase of lower extremity edema.  When she was ambulated in the emergency department her heart rate dropped into the 70s.  Sitting on room air she is 96%.  Admission is requested for further evaluation and management.  Hospital Course:   Acute hypoxemic respiratory failure -Suspect this is due to a combination of her acute on chronic combined CHF in addition to COPD that has not been formally diagnosed. -Will arrange oxygen for home use. -Please see below for further details.  Acute on chronic combined  heart failure -Echo from January 2019 with an ejection fraction of 30 to 35% and grade 1 diastolic dysfunction. -2D echo without significant change from prior: EF 30%, diffuse hypokinesis decreased left ventricular compliance. -Continue beta-blocker and ACE inhibitor. -We will transition Lasix over to 40 mg daily with potassium supplementation.  Presumed COPD with mild acute exacerbation -Patient has never been formally diagnosed as having COPD, however reviewing CT scans all the way back to 2017 show emphysema. -She used to be a 2 to 3 pack a day smoker but quit about 15 years ago. -Would definitely benefit from formal PFTs once discharge and over acute exacerbation for formal COPD diagnosis. -Do not believe she needs systemic steroids at this time. -After benefits check, Symbicort and Spiriva inhalers would be too costly for her, will instead give nebulizer machine at discharge with albuterol, Atrovent and Pulmicort nebs.  Breast cancer -Follow-up with oncology as scheduled.  Morbid obesity -Noted, counseled on lifestyle modifications.  Coronary artery disease status post MI -Stable, no chest pain, continue home meds.  Procedures:  None  Consultations:  None  Discharge Instructions  Discharge Instructions    DME Nebulizer machine   Complete by:  As directed    Patient needs a nebulizer to treat with the following condition:  COPD (chronic obstructive pulmonary disease) ([place])   Diet - low sodium heart healthy   Complete by:  As directed    Increase activity slowly   Complete by:  As directed      Allergies as of 09/21/2018  Reactions   Brilinta [ticagrelor] Other (See Comments)   Nausea and severe diarrhea- general weakness. Patient says does not want to take again      Medication List    STOP taking these medications   hydrochlorothiazide 25 MG tablet Commonly known as:  HYDRODIURIL   lidocaine-prilocaine cream Commonly known as:  EMLA   Potassium 99  MG Tabs     TAKE these medications   albuterol (2.5 MG/3ML) 0.083% nebulizer solution Commonly known as:  PROVENTIL Take 3 mLs (2.5 mg total) by nebulization every 2 (two) hours as needed for wheezing or shortness of breath.   albuterol 108 (90 Base) MCG/ACT inhaler Commonly known as:  PROVENTIL HFA;VENTOLIN HFA Inhale 2 puffs into the lungs every 6 (six) hours as needed for wheezing or shortness of breath.   aspirin 81 MG chewable tablet Chew 1 tablet (81 mg total) by mouth daily.   atorvastatin 80 MG tablet Commonly known as:  LIPITOR Take 80 mg by mouth daily.   b complex vitamins tablet Take 1 tablet by mouth daily.   budesonide 0.25 MG/2ML nebulizer solution Commonly known as:  PULMICORT Take 2 mLs (0.25 mg total) by nebulization 2 (two) times daily.   CITRACAL +D3 250-107-500 MG-MG-UNIT Chew Generic drug:  Calcium-Phosphorus-Vitamin D Chew 2 tablets by mouth 2 (two) times daily.   clopidogrel 75 MG tablet Commonly known as:  PLAVIX Take 1 tablet (75 mg total) by mouth daily.   CO Q 10 PO Take 1 tablet by mouth daily.   furosemide 40 MG tablet Commonly known as:  LASIX Take 1 tablet (40 mg total) by mouth daily.   gabapentin 400 MG capsule Commonly known as:  NEURONTIN Take 400-800 mg by mouth See admin instructions. Take 400mg  in the morning, 400mg  capsule in the afternoon, then take 800mg  at bedtime.   ipratropium 0.02 % nebulizer solution Commonly known as:  ATROVENT Take 2.5 mLs (0.5 mg total) by nebulization 4 (four) times daily.   letrozole 2.5 MG tablet Commonly known as:  FEMARA Take 2.5 mg by mouth daily.   losartan 50 MG tablet Commonly known as:  COZAAR Take 1 tablet (50 mg total) by mouth daily.   metoprolol succinate 25 MG 24 hr tablet Commonly known as:  TOPROL-XL Take 0.5 tablets by mouth daily.   multivitamin with minerals Tabs tablet Take 1 tablet by mouth daily.   multivitamin-lutein Caps capsule Take 1 capsule by mouth daily.     nitroGLYCERIN 0.4 MG SL tablet Commonly known as:  NITROSTAT Place 1 tablet (0.4 mg total) under the tongue every 5 (five) minutes as needed. What changed:  reasons to take this   Oxycodone HCl 10 MG Tabs Take 10 mg by mouth every 6 (six) hours as needed (for pain).   Potassium Chloride ER 20 MEQ Tbcr Take 10 mEq by mouth daily.            Durable Medical Equipment  (From admission, onward)         Start     Ordered   09/21/18 0000  DME Nebulizer machine    Question:  Patient needs a nebulizer to treat with the following condition  Answer:  COPD (chronic obstructive pulmonary disease) ([place])   09/21/18 1253   09/20/18 1048  For home use only DME Nebulizer machine  Once    Question:  Patient needs a nebulizer to treat with the following condition  Answer:  COPD (chronic obstructive pulmonary disease) ([place])   09/20/18 1048  Allergies  Allergen Reactions  . Brilinta [Ticagrelor] Other (See Comments)    Nausea and severe diarrhea- general weakness. Patient says does not want to take again   Follow-up Information    Vyas, Dhruv B, MD. Schedule an appointment as soon as possible for a visit in 2 week(s).   Specialty:  Internal Medicine Contact information: [place] [address] [phone]            The results of significant diagnostics from this hospitalization (including imaging, microbiology, ancillary and laboratory) are listed below for reference.    Significant Diagnostic Studies: Dg Chest 2 View  Result Date: 09/19/2018 CLINICAL DATA:  Shortness of breath. EXAM: CHEST - 2 VIEW COMPARISON:  PA and lateral chest 11/30/2017. FINDINGS: There is cardiomegaly and vascular congestion. Small right pleural effusion basilar atelectasis noted. No pneumothorax. Aortic atherosclerosis is seen. IMPRESSION: Small right pleural effusion and basilar atelectasis. Cardiomegaly mild vascular congestion. Atherosclerosis. Electronically Signed   By: Inge Rise M.D.   On: 09/19/2018 14:27   Ct Angio Chest Pe W/cm &/or Wo Cm  Result Date: 09/19/2018 CLINICAL DATA:  Shortness of breath for 1 week, worsening. EXAM: CT ANGIOGRAPHY CHEST WITH CONTRAST TECHNIQUE: Multidetector CT imaging of the chest was performed using the standard protocol during bolus administration of intravenous contrast. Multiplanar CT image reconstructions and MIPs were obtained to evaluate the vascular anatomy. CONTRAST:  100 mL ISOVUE-370 IOPAMIDOL (ISOVUE-370) INJECTION 76% COMPARISON:  PA and lateral chest 11/30/2017. FINDINGS: Cardiovascular: No pulmonary embolus is identified. There is mild cardiomegaly. No pericardial effusion. Calcific aortic and coronary atherosclerosis noted. Mediastinum/Nodes: No enlarged mediastinal, hilar, or axillary lymph nodes. Thyroid gland, trachea, and esophagus demonstrate no significant findings. Lungs/Pleura: Trace left and small right pleural effusion are seen. Nodule in the right upper lobe measuring 0.8 cm on image 26 is identified. There is some dependent atelectasis. Upper Abdomen: Negative. Musculoskeletal: No lytic or sclerotic lesion. Mid and lower thoracic degenerative disease noted. Review of the MIP images confirms the above findings. IMPRESSION: Negative for pulmonary embolus. 0.8 cm right upper lobe pulmonary nodule. Non-contrast chest CT at 6-12 months is recommended. If the nodule is stable at time of repeat CT, then future CT at 18-24 months (from today's scan) is considered optional for low-risk patients, but is recommended for high-risk patients. This recommendation follows the consensus statement: Guidelines for Management of Incidental Pulmonary Nodules Detected on CT Images: From the Fleischner Society 2017; Radiology 2017; 284:228-243. Small right and trace left pleural effusion. Cardiomegaly. Aortic Atherosclerosis (ICD10-I70.0) and Emphysema (ICD10-J43.9). Electronically Signed   By: Inge Rise M.D.   On: 09/19/2018 14:32      Microbiology: No results found for this or any previous visit (from the past 240 hour(s)).   Labs: Basic Metabolic Panel: Recent Labs  Lab 09/19/18 1204 09/20/18 0629 09/21/18 0552  NA 140 141 141  K 4.2 3.7 3.4*  CL 106 104 105  CO2 24 25 26   GLUCOSE 111* 160* 113*  BUN 16 17 21   CREATININE 0.54 0.66 0.74  CALCIUM 8.9 9.0 8.7*   Liver Function Tests: Recent Labs  Lab 09/19/18 1204  AST 26  ALT 16  ALKPHOS 59  BILITOT 2.2*  PROT 6.4*  ALBUMIN 3.5   No results for input(s): LIPASE, AMYLASE in the last 168 hours. No results for input(s): AMMONIA in the last 168 hours. CBC: Recent Labs  Lab 09/19/18 1204  WBC 4.9  NEUTROABS 2.7  HGB 11.9*  HCT 38.1  MCV 97.4  PLT 224   Cardiac Enzymes: Recent Labs  Lab 09/19/18 1204  TROPONINI <0.03   BNP: BNP (last 3 results) Recent Labs    11/30/17 1958 11/30/17 2325 09/19/18 1204  BNP 56.0 50.1 368.0*    ProBNP (last 3 results) No results for input(s): PROBNP in the last 8760 hours.  CBG: No results for input(s): GLUCAP in the last 168 hours.     Signed:  Lelon Frohlich  [facility] Pager: [phone] 09/21/2018, 12:53 PM

## 2018-09-21 NOTE — Progress Notes (Signed)
SATURATION QUALIFICATIONS: (This note is used to comply with regulatory documentation for home oxygen)  Patient Saturations on Room Air at Rest = 100%  Patient Saturations on Room Air while Ambulating = 76%  Patient Saturations on 2 Liters of oxygen while Ambulating = 100%  Please briefly explain why patient needs home oxygen: Pt desaturates on room air while ambulating.

## 2018-09-21 NOTE — Care Management Note (Signed)
Case Management Note  Patient Details  Name: ELENA DAVIA MRN: [id number] Date of Birth: 1946/05/03   Expected Discharge Date:  09/21/18               Expected Discharge Plan:  [place]  In-House Referral:  NA  Discharge planning Services  CM Consult  Post Acute Care Choice:  Home Health Choice offered to:  Patient  DME Arranged:  Nebulizer/meds, Oxygen DME Agency:  [facility]  HH Arranged:  RN, PT [place] Agency:  [facility]  Status of Service:  Completed, signed off  Additional Comments: DC home today. Pt need home O2 and neb machine, has chosen [facility] from list of DME providers. CM has given referral to [facility] rep who will deliver DME to pt room prior to DC.   Pt will need HH RN and PT. Pt has chosen [facility] from list of [facility] providers. [place], [facility] rep, given referral.   Sherald Barge, RN 09/21/2018, 1:43 PM

## 2018-09-21 NOTE — Progress Notes (Signed)
Pt discharged home today per Dr. Roderic Palau. Pt's IV site D/C'd and WDL. Pt's VSS. Pt provided with home medication list, discharge instructions and prescriptions. Verbalized understanding. Pt is currently awaiting arrival of home oxygen tank.

## 2018-10-06 ENCOUNTER — Emergency Department (HOSPITAL_COMMUNITY): Payer: Medicare Other

## 2018-10-06 ENCOUNTER — Other Ambulatory Visit: Payer: Self-pay

## 2018-10-06 ENCOUNTER — Observation Stay (HOSPITAL_COMMUNITY)
Admission: EM | Admit: 2018-10-06 | Discharge: 2018-10-07 | Disposition: A | Discharge status: Home / Self Care | Payer: Medicare Other | Attending: Internal Medicine | Admitting: Internal Medicine

## 2018-10-06 ENCOUNTER — Encounter (HOSPITAL_COMMUNITY): Payer: Self-pay | Admitting: Emergency Medicine

## 2018-10-06 DIAGNOSIS — I5043 Acute on chronic combined systolic (congestive) and diastolic (congestive) heart failure: Secondary | ICD-10-CM | POA: Diagnosis not present

## 2018-10-06 DIAGNOSIS — R7989 Other specified abnormal findings of blood chemistry: Principal | ICD-10-CM | POA: Insufficient documentation

## 2018-10-06 DIAGNOSIS — C50919 Malignant neoplasm of unspecified site of unspecified female breast: Secondary | ICD-10-CM | POA: Diagnosis not present

## 2018-10-06 DIAGNOSIS — I110 Hypertensive heart disease with heart failure: Secondary | ICD-10-CM | POA: Diagnosis not present

## 2018-10-06 DIAGNOSIS — G8929 Other chronic pain: Secondary | ICD-10-CM | POA: Insufficient documentation

## 2018-10-06 DIAGNOSIS — R748 Abnormal levels of other serum enzymes: Secondary | ICD-10-CM | POA: Diagnosis present

## 2018-10-06 DIAGNOSIS — J449 Chronic obstructive pulmonary disease, unspecified: Secondary | ICD-10-CM | POA: Diagnosis present

## 2018-10-06 DIAGNOSIS — R778 Other specified abnormalities of plasma proteins: Secondary | ICD-10-CM

## 2018-10-06 DIAGNOSIS — I10 Essential (primary) hypertension: Secondary | ICD-10-CM | POA: Diagnosis present

## 2018-10-06 DIAGNOSIS — M549 Dorsalgia, unspecified: Secondary | ICD-10-CM | POA: Diagnosis present

## 2018-10-06 DIAGNOSIS — Z7902 Long term (current) use of antithrombotics/antiplatelets: Secondary | ICD-10-CM | POA: Insufficient documentation

## 2018-10-06 DIAGNOSIS — I2510 Atherosclerotic heart disease of native coronary artery without angina pectoris: Secondary | ICD-10-CM | POA: Diagnosis present

## 2018-10-06 DIAGNOSIS — R7401 Elevation of levels of liver transaminase levels: Secondary | ICD-10-CM | POA: Diagnosis present

## 2018-10-06 DIAGNOSIS — Z7982 Long term (current) use of aspirin: Secondary | ICD-10-CM | POA: Diagnosis not present

## 2018-10-06 DIAGNOSIS — Z87891 Personal history of nicotine dependence: Secondary | ICD-10-CM | POA: Insufficient documentation

## 2018-10-06 DIAGNOSIS — I471 Supraventricular tachycardia, unspecified: Secondary | ICD-10-CM | POA: Diagnosis present

## 2018-10-06 DIAGNOSIS — E785 Hyperlipidemia, unspecified: Secondary | ICD-10-CM | POA: Diagnosis not present

## 2018-10-06 DIAGNOSIS — R1011 Right upper quadrant pain: Secondary | ICD-10-CM | POA: Diagnosis not present

## 2018-10-06 DIAGNOSIS — E782 Mixed hyperlipidemia: Secondary | ICD-10-CM | POA: Diagnosis present

## 2018-10-06 DIAGNOSIS — M5489 Other dorsalgia: Secondary | ICD-10-CM | POA: Insufficient documentation

## 2018-10-06 LAB — CBC WITH DIFFERENTIAL/PLATELET
Abs Immature Granulocytes: 0.02 10*3/uL (ref 0.00–0.07)
Basophils Absolute: 0 10*3/uL (ref 0.0–0.1)
Basophils Relative: 1 %
EOS PCT: 3 %
Eosinophils Absolute: 0.2 10*3/uL (ref 0.0–0.5)
HCT: 36.3 % (ref 36.0–46.0)
HEMOGLOBIN: 11.2 g/dL — AB (ref 12.0–15.0)
Immature Granulocytes: 0 %
LYMPHS PCT: 16 %
Lymphs Abs: 0.9 10*3/uL (ref 0.7–4.0)
MCH: 30.3 pg (ref 26.0–34.0)
MCHC: 30.9 g/dL (ref 30.0–36.0)
MCV: 98.1 fL (ref 80.0–100.0)
MONO ABS: 0.4 10*3/uL (ref 0.1–1.0)
MONOS PCT: 7 %
Neutro Abs: 4 10*3/uL (ref 1.7–7.7)
Neutrophils Relative %: 73 %
PLATELETS: 214 10*3/uL (ref 150–400)
RBC: 3.7 MIL/uL — AB (ref 3.87–5.11)
RDW: 14.5 % (ref 11.5–15.5)
WBC: 5.5 10*3/uL (ref 4.0–10.5)
nRBC: 0 % (ref 0.0–0.2)

## 2018-10-06 LAB — COMPREHENSIVE METABOLIC PANEL
ALT: 21 U/L (ref 0–44)
ANION GAP: 10 (ref 5–15)
AST: 23 U/L (ref 15–41)
Albumin: 3.7 g/dL (ref 3.5–5.0)
Alkaline Phosphatase: 54 U/L (ref 38–126)
BUN: 20 mg/dL (ref 8–23)
CALCIUM: 9 mg/dL (ref 8.9–10.3)
CO2: 27 mmol/L (ref 22–32)
Chloride: 102 mmol/L (ref 98–111)
Creatinine, Ser: 0.66 mg/dL (ref 0.44–1.00)
GFR calc non Af Amer: >60 mL/min (ref >60)
Glucose, Bld: 106 mg/dL — ABNORMAL HIGH (ref 70–99)
POTASSIUM: 3.6 mmol/L (ref 3.5–5.1)
SODIUM: 139 mmol/L (ref 135–145)
Total Bilirubin: 1.1 mg/dL (ref 0.3–1.2)
Total Protein: 6.8 g/dL (ref 6.5–8.1)

## 2018-10-06 LAB — URINALYSIS, ROUTINE W REFLEX MICROSCOPIC
Bilirubin Urine: NEGATIVE
Glucose, UA: NEGATIVE mg/dL
Hgb urine dipstick: NEGATIVE
KETONES UR: NEGATIVE mg/dL
LEUKOCYTES UA: NEGATIVE
NITRITE: NEGATIVE
PH: 7 (ref 5.0–8.0)
PROTEIN: NEGATIVE mg/dL
Specific Gravity, Urine: 1.006 (ref 1.005–1.030)

## 2018-10-06 LAB — BRAIN NATRIURETIC PEPTIDE: B NATRIURETIC PEPTIDE 5: 258 pg/mL — AB (ref 0.0–100.0)

## 2018-10-06 LAB — TROPONIN I: TROPONIN I: 0.05 ng/mL — AB (ref <0.03)

## 2018-10-06 LAB — D-DIMER, QUANTITATIVE: D-Dimer, Quant: 0.76 ug/mL-FEU — ABNORMAL HIGH (ref 0.00–0.50)

## 2018-10-06 MED ORDER — HYDROMORPHONE HCL 1 MG/ML IJ SOLN
1.0000 mg | Freq: Once | INTRAMUSCULAR | Status: AC
  Administered 2018-10-06: 1 mg via INTRAVENOUS
  Filled 2018-10-06: qty 1

## 2018-10-06 MED ORDER — IOPAMIDOL (ISOVUE-370) INJECTION 76%
100.0000 mL | Freq: Once | INTRAVENOUS | Status: AC | PRN
  Administered 2018-10-06: 100 mL via INTRAVENOUS

## 2018-10-06 MED ORDER — ONDANSETRON HCL 4 MG/2ML IJ SOLN
4.0000 mg | Freq: Once | INTRAMUSCULAR | Status: AC
  Administered 2018-10-06: 4 mg via INTRAVENOUS
  Filled 2018-10-06: qty 2

## 2018-10-06 NOTE — ED Notes (Signed)
Pt gone over to CT 

## 2018-10-06 NOTE — ED Notes (Signed)
Date and time results received: 10/06/18 2149 (use smartphrase ".now" to insert current time)  Test: Troponin Critical Value: 0.05  Name of Provider Notified: Dr. Roderic Palau  Orders Received? Or Actions Taken?: none at this time

## 2018-10-06 NOTE — ED Triage Notes (Signed)
Pt with c/o R lower back pain that radiates to R side and around to front of her R lower abdomen.

## 2018-10-07 ENCOUNTER — Encounter (HOSPITAL_COMMUNITY): Payer: Self-pay | Admitting: Internal Medicine

## 2018-10-07 DIAGNOSIS — G8929 Other chronic pain: Secondary | ICD-10-CM | POA: Diagnosis present

## 2018-10-07 DIAGNOSIS — I471 Supraventricular tachycardia: Secondary | ICD-10-CM | POA: Diagnosis present

## 2018-10-07 DIAGNOSIS — E782 Mixed hyperlipidemia: Secondary | ICD-10-CM | POA: Diagnosis present

## 2018-10-07 DIAGNOSIS — R7989 Other specified abnormal findings of blood chemistry: Principal | ICD-10-CM

## 2018-10-07 DIAGNOSIS — R748 Abnormal levels of other serum enzymes: Secondary | ICD-10-CM | POA: Diagnosis present

## 2018-10-07 DIAGNOSIS — R7401 Elevation of levels of liver transaminase levels: Secondary | ICD-10-CM | POA: Diagnosis present

## 2018-10-07 DIAGNOSIS — M549 Dorsalgia, unspecified: Secondary | ICD-10-CM | POA: Diagnosis present

## 2018-10-07 DIAGNOSIS — J449 Chronic obstructive pulmonary disease, unspecified: Secondary | ICD-10-CM | POA: Diagnosis present

## 2018-10-07 DIAGNOSIS — E785 Hyperlipidemia, unspecified: Secondary | ICD-10-CM | POA: Diagnosis present

## 2018-10-07 DIAGNOSIS — I2510 Atherosclerotic heart disease of native coronary artery without angina pectoris: Secondary | ICD-10-CM | POA: Insufficient documentation

## 2018-10-07 LAB — COMPREHENSIVE METABOLIC PANEL
ALBUMIN: 3.5 g/dL (ref 3.5–5.0)
ALT: 20 U/L (ref 0–44)
AST: 22 U/L (ref 15–41)
Alkaline Phosphatase: 54 U/L (ref 38–126)
Anion gap: 8 (ref 5–15)
BUN: 18 mg/dL (ref 8–23)
CALCIUM: 9 mg/dL (ref 8.9–10.3)
CHLORIDE: 104 mmol/L (ref 98–111)
CO2: 26 mmol/L (ref 22–32)
Creatinine, Ser: 0.7 mg/dL (ref 0.44–1.00)
GFR calc Af Amer: >60 mL/min (ref >60)
GLUCOSE: 151 mg/dL — AB (ref 70–99)
POTASSIUM: 4.2 mmol/L (ref 3.5–5.1)
Sodium: 138 mmol/L (ref 135–145)
TOTAL PROTEIN: 6.6 g/dL (ref 6.5–8.1)
Total Bilirubin: 1.5 mg/dL — ABNORMAL HIGH (ref 0.3–1.2)

## 2018-10-07 LAB — TROPONIN I
TROPONIN I: 0.05 ng/mL — AB (ref <0.03)
TROPONIN I: 0.04 ng/mL — AB (ref <0.03)

## 2018-10-07 MED ORDER — LETROZOLE 2.5 MG PO TABS
2.5000 mg | ORAL_TABLET | Freq: Every day | ORAL | Status: DC
  Administered 2018-10-07: 2.5 mg via ORAL
  Filled 2018-10-07 (×2): qty 1

## 2018-10-07 MED ORDER — METHOCARBAMOL 500 MG PO TABS
750.0000 mg | ORAL_TABLET | Freq: Four times a day (QID) | ORAL | Status: DC | PRN
  Administered 2018-10-07: 750 mg via ORAL
  Filled 2018-10-07: qty 2

## 2018-10-07 MED ORDER — IPRATROPIUM-ALBUTEROL 0.5-2.5 (3) MG/3ML IN SOLN
3.0000 mL | Freq: Two times a day (BID) | RESPIRATORY_TRACT | Status: DC
  Administered 2018-10-07: 3 mL via RESPIRATORY_TRACT
  Filled 2018-10-07: qty 3

## 2018-10-07 MED ORDER — METOPROLOL SUCCINATE ER 25 MG PO TB24
12.5000 mg | ORAL_TABLET | Freq: Every evening | ORAL | Status: DC
  Administered 2018-10-07: 12.5 mg via ORAL
  Filled 2018-10-07: qty 1

## 2018-10-07 MED ORDER — ALBUTEROL SULFATE (2.5 MG/3ML) 0.083% IN NEBU: 3.0000 mL | INHALATION_SOLUTION | Freq: Four times a day (QID) | RESPIRATORY_TRACT | Status: DC | PRN

## 2018-10-07 MED ORDER — SODIUM CHLORIDE 0.9% FLUSH
3.0000 mL | Freq: Two times a day (BID) | INTRAVENOUS | Status: DC
  Administered 2018-10-07 (×2): 3 mL via INTRAVENOUS

## 2018-10-07 MED ORDER — METOPROLOL SUCCINATE ER 25 MG PO TB24: 12.5000 mg | ORAL_TABLET | Freq: Every evening | ORAL | Status: DC

## 2018-10-07 MED ORDER — CLOPIDOGREL BISULFATE 75 MG PO TABS
75.0000 mg | ORAL_TABLET | Freq: Every morning | ORAL | Status: DC
  Administered 2018-10-07: 75 mg via ORAL
  Filled 2018-10-07: qty 1

## 2018-10-07 MED ORDER — CYCLOBENZAPRINE HCL 10 MG PO TABS: 10.0000 mg | ORAL_TABLET | Freq: Three times a day (TID) | ORAL | Status: DC | PRN

## 2018-10-07 MED ORDER — CALCIUM CARBONATE-VITAMIN D 500-200 MG-UNIT PO TABS
1.0000 | ORAL_TABLET | Freq: Two times a day (BID) | ORAL | Status: DC
  Administered 2018-10-07: 1 via ORAL
  Filled 2018-10-07: qty 1

## 2018-10-07 MED ORDER — ASPIRIN 81 MG PO CHEW
81.0000 mg | CHEWABLE_TABLET | Freq: Every morning | ORAL | Status: DC
  Administered 2018-10-07: 81 mg via ORAL
  Filled 2018-10-07: qty 1

## 2018-10-07 MED ORDER — ATORVASTATIN CALCIUM 40 MG PO TABS
80.0000 mg | ORAL_TABLET | Freq: Every morning | ORAL | Status: DC
  Administered 2018-10-07: 80 mg via ORAL
  Filled 2018-10-07: qty 2

## 2018-10-07 MED ORDER — LOSARTAN POTASSIUM 50 MG PO TABS
100.0000 mg | ORAL_TABLET | Freq: Every morning | ORAL | Status: DC
  Administered 2018-10-07: 100 mg via ORAL
  Filled 2018-10-07: qty 2

## 2018-10-07 MED ORDER — NITROGLYCERIN 0.4 MG SL SUBL: 0.4000 mg | SUBLINGUAL_TABLET | SUBLINGUAL | Status: DC | PRN

## 2018-10-07 MED ORDER — CALCIUM CITRATE-VITAMIN D 500-500 MG-UNIT PO CHEW
1.0000 | CHEWABLE_TABLET | Freq: Two times a day (BID) | ORAL | Status: DC
  Filled 2018-10-07 (×3): qty 1

## 2018-10-07 MED ORDER — KETOROLAC TROMETHAMINE 30 MG/ML IJ SOLN
30.0000 mg | Freq: Once | INTRAMUSCULAR | Status: AC
  Administered 2018-10-07: 30 mg via INTRAVENOUS
  Filled 2018-10-07: qty 1

## 2018-10-07 MED ORDER — FUROSEMIDE 40 MG PO TABS
40.0000 mg | ORAL_TABLET | Freq: Every morning | ORAL | Status: DC
  Administered 2018-10-07: 40 mg via ORAL
  Filled 2018-10-07: qty 1

## 2018-10-07 MED ORDER — OXYCODONE HCL 5 MG PO TABS
10.0000 mg | ORAL_TABLET | Freq: Four times a day (QID) | ORAL | Status: DC | PRN
  Filled 2018-10-07 (×2): qty 2

## 2018-10-07 MED ORDER — BUDESONIDE 0.25 MG/2ML IN SUSP: 0.2500 mg | Freq: Two times a day (BID) | RESPIRATORY_TRACT | Status: DC

## 2018-10-07 MED ORDER — GABAPENTIN 300 MG PO CAPS
300.0000 mg | ORAL_CAPSULE | Freq: Every morning | ORAL | Status: DC
  Administered 2018-10-07: 300 mg via ORAL
  Filled 2018-10-07 (×2): qty 1

## 2018-10-07 MED ORDER — GABAPENTIN 400 MG PO CAPS
800.0000 mg | ORAL_CAPSULE | Freq: Every day | ORAL | Status: DC
  Administered 2018-10-07: 800 mg via ORAL
  Filled 2018-10-07: qty 2

## 2018-10-07 MED ORDER — OXYCODONE HCL 5 MG PO TABS
10.0000 mg | ORAL_TABLET | ORAL | Status: DC | PRN
  Administered 2018-10-07: 10 mg via ORAL

## 2018-10-07 NOTE — Care Management Note (Signed)
Case Management Note  Patient Details  Name: Stacey [name] MRN: [id number] Date of Birth: 08-Apr-1946  Subjective/Objective:  Elevated troponin.  From home. Independent. Has continuous oxygen ([facility]) and nebulizer at home. Sent home last admission with neb medications instead of inhalers due to cost (benefits check was done and insurance gave the most cost effective nebs). Patient does have insurance and prescription coverage. Patient is active with [facility] home health for RN and PT services.              Action/Plan: DC home.   Expected Discharge Date:   10/07/2018               Expected Discharge Plan:  [place]  In-House Referral:     Discharge planning Services  CM Consult  Post Acute Care Choice:  Home Health, Resumption of Svcs/PTA Provider Choice offered to:  Patient  DME Arranged:    DME Agency:     HH Arranged:    HH Agency:  [facility]  Status of Service:  In process, will continue to follow  If discussed at Long Length of Stay Meetings, dates discussed:    Additional Comments:  Karli Wickizer, Chauncey [place], RN 10/07/2018, 9:32 AM

## 2018-10-07 NOTE — Plan of Care (Signed)

## 2018-10-07 NOTE — ED Provider Notes (Signed)
[facility] EMERGENCY DEPARTMENT Provider Note   CSN: [id number] Arrival date & time: 10/06/18  2015     History   Chief Complaint Chief Complaint  Patient presents with  . Back Pain    HPI [name] is a 72 y.o. female.  Patient complains of upper back pain and shortness of breath.  The history is provided by the patient. No language interpreter was used.  Back Pain   This is a new problem. The current episode started 12 to 24 hours ago. The problem occurs constantly. The problem has not changed since onset.The pain is associated with no known injury. Pain location: Upper back. The quality of the pain is described as aching. The pain does not radiate. The pain is at a severity of 3/10. The pain is moderate. The symptoms are aggravated by bending. The pain is worse during the day. Associated symptoms include chest pain. Pertinent negatives include no headaches and no abdominal pain.    Past Medical History:  Diagnosis Date  . CAD in native artery    a.DES to ramus 2005 with late stent thrombosis 2006 tx with PTCA. 12/18 PCI/DES x1 to mRCA, EF 50-55%  . CHF (congestive heart failure) ([place])   . Chronic pain   . COPD (chronic obstructive pulmonary disease) ([place])   . Hyperlipidemia   . Hypertension   . LBBB (left bundle branch block)   . Left bundle branch block   . Lymphedema   . Metastatic breast cancer ([place])    a. to bone.  . MI (myocardial infarction) ([place])   . Mild aortic stenosis 10/2017  . Morbid obesity ([place])   . PSVT (paroxysmal supraventricular tachycardia) ([place])    a. per [facility] notes, seen on event monitor in 2014.  . Pulmonary nodules     Patient Active Problem List   Diagnosis Date Noted  . Elevated troponin 10/07/2018  . Acute hypoxemic respiratory failure ([place]) 09/19/2018  . Acute on chronic combined systolic and diastolic CHF (congestive heart failure) ([place]) 09/19/2018  . COPD with acute exacerbation ([place]) 09/19/2018  . Dyspnea 11/30/2017  .  Non-ST elevation (NSTEMI) myocardial infarction ([place])   . Hypertension 11/23/2017  . Left bundle branch block 11/23/2017  . Acute on chronic diastolic CHF (congestive heart failure) ([place]) 11/23/2017  . CAD (coronary artery disease) 11/23/2017  . Breast cancer ([place]) 11/23/2017  . Acute respiratory failure with hypoxia ([place]) 11/23/2017    Past Surgical History:  Procedure Laterality Date  . CORONARY STENT INTERVENTION N/A 11/26/2017   Procedure: CORONARY STENT INTERVENTION;  Surgeon: Martinique, Peter M, MD;  Location: [place] [facility];  Service: Cardiovascular;  Laterality: N/A;  . CORONARY STENT PLACEMENT    . LEFT HEART CATH AND CORONARY ANGIOGRAPHY N/A 11/26/2017   Procedure: LEFT HEART CATH AND CORONARY ANGIOGRAPHY;  Surgeon: Martinique, Peter M, MD;  Location: [place] [facility];  Service: Cardiovascular;  Laterality: N/A;     OB History   None      Home Medications    Prior to Admission medications   Medication Sig Start Date End Date Taking? Authorizing Provider  aspirin 81 MG chewable tablet Chew 1 tablet (81 mg total) by mouth daily. Patient taking differently: Chew 81 mg by mouth every morning.  11/27/17  Yes Cheryln Manly, NP  atorvastatin (LIPITOR) 80 MG tablet Take 80 mg by mouth every morning.    Yes [provider]  b complex vitamins tablet Take 1 tablet by mouth every morning.  Yes [provider]  budesonide (PULMICORT) 0.25 MG/2ML nebulizer solution Take 2 mLs (0.25 mg total) by nebulization 2 (two) times daily. 09/21/18 09/21/19 Yes Erline Hau, MD  Calcium Citrate-Vitamin D (CITRACAL MAXIMUM) 315-250 MG-UNIT TABS Take 2 tablets by mouth 2 (two) times daily.   Yes [provider]  clopidogrel (PLAVIX) 75 MG tablet Take 1 tablet (75 mg total) by mouth daily. Patient taking differently: Take 75 mg by mouth every morning.  12/03/17  Yes [facility], Tami Lin, PA  cyclobenzaprine (FLEXERIL) 10 MG tablet Take 10 mg by mouth 3  (three) times daily as needed for muscle spasms.   Yes [provider]  furosemide (LASIX) 40 MG tablet Take 1 tablet (40 mg total) by mouth daily. Patient taking differently: Take 40 mg by mouth every morning.  09/21/18 09/21/19 Yes Erline Hau, MD  gabapentin (NEURONTIN) 300 MG capsule Take 300 mg by mouth every morning.   Yes [provider]  gabapentin (NEURONTIN) 400 MG capsule Take 800 mg by mouth at bedtime.   Yes [provider]  ipratropium-albuterol (DUONEB) 0.5-2.5 (3) MG/3ML SOLN Take 3 mLs by nebulization 2 (two) times daily. *May do two additional treatments as needed for shortness of breath with Pulmicort   Yes [provider]  letrozole (FEMARA) 2.5 MG tablet Take 2.5 mg by mouth at bedtime.    Yes [provider]  losartan (COZAAR) 50 MG tablet Take 1 tablet (50 mg total) by mouth daily. Patient taking differently: Take 100 mg by mouth every morning.  12/03/17  Yes [facility], Tami Lin, PA  metoprolol succinate (TOPROL-XL) 25 MG 24 hr tablet Take 0.5 tablets by mouth every evening.  03/08/18  Yes [provider]  Multiple Vitamins-Minerals (EQ VISION FORMULA 50+ PO) Take 1 tablet by mouth every morning.   Yes [provider]  Multiple Vitamins-Minerals (HAIR/SKIN/NAILS) CAPS Take 1 capsule by mouth every morning.   Yes [provider]  nitroGLYCERIN (NITROSTAT) 0.4 MG SL tablet Place 1 tablet (0.4 mg total) under the tongue every 5 (five) minutes as needed. Patient taking differently: Place 0.4 mg under the tongue every 5 (five) minutes as needed for chest pain.  11/27/17  Yes Reino Bellis B, NP  Oxycodone HCl 10 MG TABS Take 10 mg by mouth every 6 (six) hours as needed (for pain).   Yes [provider]  Potassium 99 MG TABS Take 1 tablet by mouth every morning.   Yes [provider]  Ubiquinol (QUNOL COQ10/UBIQUINOL/MEGA) 100 MG CAPS Take 1 capsule by mouth every morning.  QUNOL-Ultra COQ10   Yes [provider]  albuterol (PROVENTIL HFA;VENTOLIN HFA) 108 (90 Base) MCG/ACT inhaler Inhale 2 puffs into the lungs every 6 (six) hours as needed for wheezing or shortness of breath. 09/21/18   Isaac Bliss, Rayford Halsted, MD  albuterol (PROVENTIL) (2.5 MG/3ML) 0.083% nebulizer solution Take 3 mLs (2.5 mg total) by nebulization every 2 (two) hours as needed for wheezing or shortness of breath. Patient not taking: Reported on 10/06/2018 09/21/18   Isaac Bliss, Rayford Halsted, MD  ipratropium (ATROVENT) 0.02 % nebulizer solution Take 2.5 mLs (0.5 mg total) by nebulization 4 (four) times daily. Patient not taking: Reported on 10/06/2018 09/21/18   Isaac Bliss, Rayford Halsted, MD  potassium chloride 20 MEQ TBCR Take 10 mEq by mouth daily. Patient not taking: Reported on 10/06/2018 09/21/18   Isaac Bliss, Rayford Halsted, MD    Family History Family History  Problem Relation Age of  Onset  . Sudden Cardiac Death Neg Hx     Social History Social History   Tobacco Use  . Smoking status: Former Smoker    Packs/day: 2.00    Years: 18.00    Pack years: 36.00    Types: Cigarettes    Last attempt to quit: 12/01/1980    Years since quitting: 37.8  . Smokeless tobacco: Never Used  Substance Use Topics  . Alcohol use: No    Frequency: Never  . Drug use: No     Allergies   Brilinta [ticagrelor]   Review of Systems Review of Systems  Constitutional: Negative for appetite change and fatigue.  HENT: Negative for congestion, ear discharge and sinus pressure.   Eyes: Negative for discharge.  Respiratory: Negative for cough.   Cardiovascular: Positive for chest pain.  Gastrointestinal: Negative for abdominal pain and diarrhea.  Genitourinary: Negative for frequency and hematuria.  Musculoskeletal: Positive for back pain.  Skin: Negative for rash.  Neurological: Negative for seizures and headaches.  Psychiatric/Behavioral: Negative for hallucinations.      Physical Exam Updated Vital Signs BP 126/85 (BP Location: Left Wrist)   Pulse 62   Temp 98 F (36.7 C) (Oral)   Resp (!) 22   Ht 5' (1.524 m)   Wt 114.3 kg   SpO2 99%   BMI 49.22 kg/m   Physical Exam  Constitutional: She is oriented to person, place, and time. She appears well-developed.  HENT:  Head: Normocephalic.  Eyes: Conjunctivae and EOM are normal. No scleral icterus.  Neck: Neck supple. No thyromegaly present.  Cardiovascular: Normal rate and regular rhythm. Exam reveals no gallop and no friction rub.  No murmur heard. Pulmonary/Chest: No stridor. She has no wheezes. She has no rales. She exhibits tenderness.  Abdominal: She exhibits no distension. There is no tenderness. There is no rebound.  Musculoskeletal: Normal range of motion. She exhibits no edema.  Lymphadenopathy:    She has no cervical adenopathy.  Neurological: She is oriented to person, place, and time. She exhibits normal muscle tone. Coordination normal.  Skin: No rash noted. No erythema.  Psychiatric: She has a normal mood and affect. Her behavior is normal.     ED Treatments / Results  Labs (all labs ordered are listed, but only abnormal results are displayed) Labs Reviewed  CBC WITH DIFFERENTIAL/PLATELET - Abnormal; Notable for the following components:      Result Value   RBC 3.70 (*)    Hemoglobin 11.2 (*)    All other components within normal limits  COMPREHENSIVE METABOLIC PANEL - Abnormal; Notable for the following components:   Glucose, Bld 106 (*)    All other components within normal limits  URINALYSIS, ROUTINE W REFLEX MICROSCOPIC - Abnormal; Notable for the following components:   APPearance HAZY (*)    All other components within normal limits  D-DIMER, QUANTITATIVE (NOT AT [facility]) - Abnormal; Notable for the following components:   D-Dimer, Quant 0.76 (*)    All other components within normal limits  BRAIN NATRIURETIC PEPTIDE - Abnormal; Notable for the following  components:   B Natriuretic Peptide 258.0 (*)    All other components within normal limits  TROPONIN I - Abnormal; Notable for the following components:   Troponin I 0.05 (*)    All other components within normal limits    EKG None  Radiology Dg Chest 2 View  Result Date: 10/06/2018 CLINICAL DATA:  Right flank pain and shortness of breath. History of CHF, COPD, and metastatic  breast cancer. EXAM: CHEST - 2 VIEW COMPARISON:  Chest radiographs and CT 09/19/2018 FINDINGS: The cardiac silhouette is borderline enlarged. The lungs are hypoinflated with persistent pulmonary vascular congestion. Mild left and minimal right basilar opacities are compatible with atelectasis. No pleural effusion or pneumothorax is identified. No acute osseous abnormality is seen. IMPRESSION: Pulmonary vascular congestion and mild bibasilar atelectasis. Electronically Signed   By: Logan Bores M.D.   On: 10/06/2018 21:47   Ct Angio Chest Pe W And/or Wo Contrast  Result Date: 10/06/2018 CLINICAL DATA:  Shortness of breath and elevated D-dimer. Right sided back pain. EXAM: CT ANGIOGRAPHY CHEST WITH CONTRAST TECHNIQUE: Multidetector CT imaging of the chest was performed using the standard protocol during bolus administration of intravenous contrast. Multiplanar CT image reconstructions and MIPs were obtained to evaluate the vascular anatomy. CONTRAST:  185mL ISOVUE-370 IOPAMIDOL (ISOVUE-370) INJECTION 76% COMPARISON:  09/19/2018 FINDINGS: Cardiovascular: Pulmonary arterial opacification is adequate without evidence of emboli. Thoracic aortic atherosclerosis is noted without aneurysm. There is coronary artery atherosclerosis and mild cardiomegaly. There is no pericardial effusion. Mediastinum/Nodes: 3 cm upper outer right breast mass with calcifications, incompletely imaged. No enlarged axillary, mediastinal, or hilar lymph nodes. Grossly unremarkable esophagus and thyroid. Lungs/Pleura: Trace bilateral pleural effusions,  decreased from prior. Unchanged 8 mm right upper lobe nodule (series 6, image 22). Bibasilar atelectasis. Mild centrilobular emphysema. Upper Abdomen: Unremarkable. Musculoskeletal: Unchanged 7 mm lucent lesion in the upper sternal body on the right with suggestion of some coarsened internal trabeculation which may reflect a benign hemangioma. No acute osseous abnormality. Moderate lower thoracic disc degeneration. Review of the MIP images confirms the above findings. IMPRESSION: 1. No evidence of pulmonary emboli. 2. Trace bilateral pleural effusions, decreased from prior. 3. Partially visualized 3 cm outer right breast mass. Correlate with mammography. 4. Unchanged 8 mm right upper lobe nodule with follow-up recommendations as on the prior CT. 5. Aortic Atherosclerosis (ICD10-I70.0) and Emphysema (ICD10-J43.9). Electronically Signed   By: Logan Bores M.D.   On: 10/06/2018 23:44   Ct Renal Stone Study  Result Date: 10/06/2018 CLINICAL DATA:  Right flank pain. History of metastatic breast cancer. EXAM: CT ABDOMEN AND PELVIS WITHOUT CONTRAST TECHNIQUE: Multidetector CT imaging of the abdomen and pelvis was performed following the standard protocol without IV contrast. COMPARISON:  07/14/2018 FINDINGS: Lower chest: Mild atelectasis in the lung bases. No pleural effusion. Borderline enlarged heart. Coronary artery calcification. Hepatobiliary: No focal liver abnormality is seen. No gallstones, gallbladder wall thickening, or biliary dilatation. Pancreas: Unremarkable. Spleen: Unremarkable. Adrenals/Urinary Tract: Unremarkable adrenal glands. Minimal fullness of the right renal collecting system. No urolithiasis. No ureteral dilatation. Unchanged 4 mm hyperdense lesion in the interpolar right kidney, possibly a proteinaceous or hemorrhagic cyst. Unchanged suspected 10 mm cyst in the upper pole of the left kidney. Decompressed bladder. Stomach/Bowel: The stomach is within normal limits. Sigmoid colon diverticulosis  is noted without evidence of diverticulitis. There is no evidence of bowel obstruction. The appendix is not clearly identified, however no inflammatory changes are seen in the right lower quadrant. Vascular/Lymphatic: Abdominal aortic atherosclerosis without aneurysm. No enlarged lymph nodes. Reproductive: Status post hysterectomy. No adnexal masses. Other: No intraperitoneal free fluid. Unchanged fat containing periumbilical hernia. Unchanged benign appearing calcification in the left lower omentum. Musculoskeletal: Remote left inferior pubic ramus fracture. Unchanged sclerotic L2 vertebral body lesion. Transitional L5 segment. Severe lower lumbar facet arthrosis asymmetric on the left. IMPRESSION: 1. No urolithiasis, evidence of significant urinary tract obstruction, or other acute abnormality identified in the abdomen  or pelvis. 2. Chronic findings as above including a sclerotic L2 vertebral metastasis. 3.  Aortic Atherosclerosis (ICD10-I70.0). Electronically Signed   By: Logan Bores M.D.   On: 10/06/2018 21:57    Procedures Procedures (including critical care time)  Medications Ordered in ED Medications  HYDROmorphone (DILAUDID) injection 1 mg (1 mg Intravenous Given 10/06/18 2053)  ondansetron (ZOFRAN) injection 4 mg (4 mg Intravenous Given 10/06/18 2051)  iopamidol (ISOVUE-370) 76 % injection 100 mL (100 mLs Intravenous Contrast Given 10/06/18 2311)     Initial Impression / Assessment and Plan / ED Course  I have reviewed the triage vital signs and the nursing notes.  Pertinent labs & imaging results that were available during my care of the patient were reviewed by me and considered in my medical decision making (see chart for details).     Patient with chest and back pain.  The abdomen and chest showed no acute disease.  Patient does have an elevated troponin and a left bundle on EKG that is old.  She will be admitted and have her enzymes cycled  Final Clinical Impressions(s) / ED  Diagnoses   Final diagnoses:  Elevated troponin    ED Discharge Orders    None       Milton Ferguson, MD 10/07/18 [phone]

## 2018-10-07 NOTE — Plan of Care (Signed)
  Problem: Education: Goal: Knowledge of General Education information will improve Description Including pain rating scale, medication(s)/side effects and non-pharmacologic comfort measures 10/07/2018 1040 by Rance Muir, RN Outcome: Adequate for Discharge 10/07/2018 0812 by Rance Muir, RN Outcome: Progressing   Problem: Health Behavior/Discharge Planning: Goal: Ability to manage health-related needs will improve 10/07/2018 1040 by Rance Muir, RN Outcome: Adequate for Discharge 10/07/2018 0812 by Rance Muir, RN Outcome: Progressing   Problem: Clinical Measurements: Goal: Ability to maintain clinical measurements within normal limits will improve 10/07/2018 1040 by Rance Muir, RN Outcome: Adequate for Discharge 10/07/2018 0812 by Rance Muir, RN Outcome: Progressing Goal: Will remain free from infection 10/07/2018 1040 by Rance Muir, RN Outcome: Adequate for Discharge 10/07/2018 0812 by Rance Muir, RN Outcome: Progressing Goal: Diagnostic test results will improve 10/07/2018 1040 by Rance Muir, RN Outcome: Adequate for Discharge 10/07/2018 0812 by Rance Muir, RN Outcome: Progressing Goal: Respiratory complications will improve 10/07/2018 1040 by Rance Muir, RN Outcome: Adequate for Discharge 10/07/2018 0812 by Rance Muir, RN Outcome: Progressing Goal: Cardiovascular complication will be avoided 10/07/2018 1040 by Rance Muir, RN Outcome: Adequate for Discharge 10/07/2018 0812 by Rance Muir, RN Outcome: Progressing   Problem: Activity: Goal: Risk for activity intolerance will decrease 10/07/2018 1040 by Rance Muir, RN Outcome: Adequate for Discharge 10/07/2018 [phone] by Rance Muir, RN Outcome: Progressing   Problem: Nutrition: Goal: Adequate nutrition will be maintained 10/07/2018 1040 by Rance Muir, RN Outcome: Adequate for Discharge 10/07/2018 0812 by Rance Muir, RN Outcome: Progressing   Problem: Activity: Goal: Risk for activity intolerance will decrease 10/07/2018 1040 by Rance Muir, RN Outcome:  Adequate for Discharge 10/07/2018 [phone] by Rance Muir, RN Outcome: Progressing   Problem: Nutrition: Goal: Adequate nutrition will be maintained 10/07/2018 1040 by Rance Muir, RN Outcome: Adequate for Discharge 10/07/2018 0812 by Rance Muir, RN Outcome: Progressing   Problem: Coping: Goal: Level of anxiety will decrease 10/07/2018 1040 by Rance Muir, RN Outcome: Adequate for Discharge 10/07/2018 0812 by Rance Muir, RN Outcome: Progressing   Problem: Elimination: Goal: Will not experience complications related to bowel motility 10/07/2018 1040 by Rance Muir, RN Outcome: Adequate for Discharge 10/07/2018 0812 by Rance Muir, RN Outcome: Progressing Goal: Will not experience complications related to urinary retention 10/07/2018 1040 by Rance Muir, RN Outcome: Adequate for Discharge 10/07/2018 0812 by Rance Muir, RN Outcome: Progressing   Problem: Pain Managment: Goal: General experience of comfort will improve 10/07/2018 1040 by Rance Muir, RN Outcome: Adequate for Discharge 10/07/2018 0812 by Rance Muir, RN Outcome: Progressing   Problem: Safety: Goal: Ability to remain free from injury will improve 10/07/2018 1040 by Rance Muir, RN Outcome: Adequate for Discharge 10/07/2018 0812 by Rance Muir, RN Outcome: Progressing   Problem: Skin Integrity: Goal: Risk for impaired skin integrity will decrease 10/07/2018 1040 by Rance Muir, RN Outcome: Adequate for Discharge 10/07/2018 0812 by Rance Muir, RN Outcome: Progressing

## 2018-10-07 NOTE — Discharge Instructions (Signed)

## 2018-10-07 NOTE — Progress Notes (Signed)
Discharge instructions given to patient with stated understanding.  IV and cardiac monitor removed.  Patient's son at bedside to transport patient home.

## 2018-10-07 NOTE — H&P (Signed)
History and Physical    [name] XAJ:[id number] DOB: 10-04-1946 DOA: 10/06/2018  PCP: Glenda Chroman, MD  Patient coming from: Home.  I have personally briefly reviewed patient's old medical records in [place]  Chief Complaint: Chest pain.  HPI: [name] is a 72 y.o. 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  She mentions that the urine the physical therapy session she felt that her right leg was feeling overextended.  She is starting to feel pain and told the therapist to limit the movements.  Once the PT session was over, she mentions that her pain kept getting worse.  She states that her pain radiates down her right thigh sometimes all the way down to her right foot.  She has taken some oxycodone 10 mg tablets that she has at home.  She denies fever, chills, sore throat, fever chills wheezing, hemoptysis, chest pain, palpitations, diaphoresis, PND or orthopnea.  She denies dysuria, frequency or hematuria.  No heat or cold intolerance.  ED Course: Initial vital signs temperature 98 F, pulse 105, respirations 24, blood pressure 173/78 mmHg and O2 sat 96% on room air.  The patient received 1 mg of hydromorphone and 4 mg of ondansetron IVP.  She is stated that this did not relieve her pain significantly and that she has been getting more relief from her oral oxycodone at home.  Her urinalysis had a straw color and 100 mg/dL of proteinuria.  All other values are within normal limits.  White count on her CBC was 9.5 with no unremarkable differential, hemoglobin  8.5 g/dL and platelets 105.  Lactic acid was 1.26 and potassium 5.7 mmol/L.  BUN was 77, creatinine 2.05 and glucose 129 mg/dL.  All other CMP values are within normal limits.  Her BNP was 132.0 pg/mL.  Imaging: Her chest radiograph showed pulmonary vascular congestion and mild bibasilar atelectasis.  CT renal did not show any urolithiasis or evidence of urinary tract obstruction.  Chronic sclerotic L2 vertebral metastasis was seen again.  CTA chest did not show any evidence of PE.  However he demonstrated again right upper lobe pulmonary nodule which is 0.8 cm in size.  Noncontrast CT recommended at 18 to 24 months per radiology report.  Review of Systems: As per HPI otherwise 10 point review of systems negative.   Past Medical History:  Diagnosis Date  . CAD in native artery    a.DES to ramus 2005 with late stent thrombosis 2006 tx with PTCA. 12/18 PCI/DES x1 to mRCA, EF 50-55%  . CHF (congestive heart failure) ([place])   . Chronic pain   . COPD (chronic obstructive pulmonary disease) ([place])   . Hyperlipidemia   . Hypertension   . LBBB (left bundle branch block)   . Left bundle branch block   . Lymphedema   . Metastatic breast cancer ([place])    a. to bone.  . MI (myocardial infarction) ([place])   . Mild aortic stenosis 10/2017  . Morbid obesity ([place])   . PSVT (paroxysmal supraventricular tachycardia) ([place])    a. per [facility] notes, seen on event monitor in 2014.  Marland Kitchen  Pulmonary nodules     Past Surgical History:  Procedure Laterality Date  . CORONARY STENT INTERVENTION N/A 11/26/2017   Procedure: CORONARY STENT INTERVENTION;  Surgeon: Martinique, Peter M, MD;  Location: [facility];  Service: Cardiovascular;  Laterality: N/A;  . CORONARY STENT PLACEMENT    . LEFT HEART CATH AND CORONARY ANGIOGRAPHY N/A 11/26/2017   Procedure: LEFT HEART CATH AND CORONARY ANGIOGRAPHY;  Surgeon: Martinique, Peter M, MD;  Location: [facility];  Service: Cardiovascular;  Laterality: N/A;     reports that  she quit smoking about 37 years ago. Her smoking use included cigarettes. She has a 36.00 pack-year smoking history. She has never used smokeless tobacco. She reports that she does not drink alcohol or use drugs.  Allergies  Allergen Reactions  . Brilinta [Ticagrelor] Other (See Comments)    Nausea and severe diarrhea- general weakness. Patient says does not want to take again    Family History  Problem Relation Age of Onset  . Sudden Cardiac Death Neg Hx     Prior to Admission medications   Medication Sig Start Date End Date Taking? Authorizing Provider  aspirin 81 MG chewable tablet Chew 1 tablet (81 mg total) by mouth daily. Patient taking differently: Chew 81 mg by mouth every morning.  11/27/17  Yes Cheryln Manly, NP  atorvastatin (LIPITOR) 80 MG tablet Take 80 mg by mouth every morning.    Yes [provider]  b complex vitamins tablet Take 1 tablet by mouth every morning.    Yes [provider]  budesonide (PULMICORT) 0.25 MG/2ML nebulizer solution Take 2 mLs (0.25 mg total) by nebulization 2 (two) times daily. 09/21/18 09/21/19 Yes Erline Hau, MD  Calcium Citrate-Vitamin D (CITRACAL MAXIMUM) 315-250 MG-UNIT TABS Take 2 tablets by mouth 2 (two) times daily.   Yes [provider]  clopidogrel (PLAVIX) 75 MG tablet Take 1 tablet (75 mg total) by mouth daily. Patient taking differently: Take 75 mg by mouth every morning.  12/03/17  Yes [facility], Tami Lin, PA  cyclobenzaprine (FLEXERIL) 10 MG tablet Take 10 mg by mouth 3 (three) times daily as needed for muscle spasms.   Yes [provider]  furosemide (LASIX) 40 MG tablet Take 1 tablet (40 mg total) by mouth daily. Patient taking differently: Take 40 mg by mouth every morning.  09/21/18 09/21/19 Yes Erline Hau, MD  gabapentin (NEURONTIN) 300 MG capsule Take 300 mg by mouth every morning.   Yes [provider]  gabapentin (NEURONTIN) 400 MG capsule Take 800  mg by mouth at bedtime.   Yes [provider]  ipratropium-albuterol (DUONEB) 0.5-2.5 (3) MG/3ML SOLN Take 3 mLs by nebulization 2 (two) times daily. *May do two additional treatments as needed for shortness of breath with Pulmicort   Yes [provider]  letrozole (FEMARA) 2.5 MG tablet Take 2.5 mg by mouth at bedtime.    Yes [provider]  losartan (COZAAR) 50 MG tablet Take 1 tablet (50 mg total) by mouth daily. Patient taking differently: Take 100 mg by mouth every morning.  12/03/17  Yes [facility], Tami Lin, PA  metoprolol succinate (TOPROL-XL) 25 MG 24 hr tablet Take 0.5 tablets by mouth every evening.  03/08/18  Yes [provider]  Multiple Vitamins-Minerals (EQ VISION FORMULA 50+ PO) Take 1 tablet by mouth every morning.   Yes [provider]  Multiple Vitamins-Minerals (HAIR/SKIN/NAILS) CAPS Take 1 capsule by mouth every morning.   Yes [provider]  nitroGLYCERIN (NITROSTAT) 0.4 MG SL tablet Place 1 tablet (0.4 mg total) under the tongue every 5 (five) minutes as needed. Patient taking differently: Place 0.4 mg under the tongue every 5 (five) minutes as needed for chest pain.  11/27/17  Yes Reino Bellis B, NP  Oxycodone HCl 10 MG TABS Take 10 mg by mouth every 6 (six) hours as needed (for pain).   Yes [provider]  Potassium 99 MG TABS Take 1 tablet by mouth every morning.   Yes [provider]  Ubiquinol (QUNOL COQ10/UBIQUINOL/MEGA) 100 MG CAPS Take 1 capsule by mouth every morning. QUNOL-Ultra COQ10   Yes [provider]  albuterol (PROVENTIL HFA;VENTOLIN HFA) 108 (90 Base) MCG/ACT inhaler Inhale 2 puffs into the lungs every 6 (six) hours as needed for wheezing or shortness of breath. 09/21/18   Isaac Bliss, Rayford Halsted, MD  albuterol (PROVENTIL) (2.5 MG/3ML) 0.083% nebulizer solution Take 3 mLs (2.5 mg total) by nebulization every 2 (two) hours as needed for wheezing or shortness of breath. Patient  not taking: Reported on 10/06/2018 09/21/18   Isaac Bliss, Rayford Halsted, MD  ipratropium (ATROVENT) 0.02 % nebulizer solution Take 2.5 mLs (0.5 mg total) by nebulization 4 (four) times daily. Patient not taking: Reported on 10/06/2018 09/21/18   Isaac Bliss, Rayford Halsted, MD  potassium chloride 20 MEQ TBCR Take 10 mEq by mouth daily. Patient not taking: Reported on 10/06/2018 09/21/18   Isaac Bliss, Rayford Halsted, MD    Physical Exam: Vitals:   10/06/18 2021 10/06/18 2200 10/06/18 2352 10/07/18 0000  BP: (!) 173/78 (!) 175/95 126/85 (!) 147/67  Pulse: (!) 105 98 62 95  Resp: (!) 24  (!) 22   Temp: 98 F (36.7 C)     TempSrc: Oral     SpO2: 96% 96% 99% 99%  Weight: 114.3 kg     Height: 5' (1.524 m)       Constitutional: NAD, calm, comfortable Eyes: PERRL, lids and conjunctivae normal ENMT: Mucous membranes are moist. Posterior pharynx clear of any exudate or lesions. Neck: Normal, supple, no masses, no thyromegaly Respiratory: Clear to auscultation bilaterally, no wheezing, no crackles. Normal respiratory effort. No accessory muscle use.  Cardiovascular: Regular rate and rhythm, no murmurs / rubs / gallops. No extremity edema. 2+ pedal pulses. No carotid bruits.  Abdomen: Bowel sounds positive.  Obese.  Soft, no tenderness, no masses palpated. No hepatosplenomegaly.  Musculoskeletal: no clubbing / cyanosis.  Positive paraspinal muscle tenderness on right sided LS spine.  Decreased lower back and RLE ROM, no contractures. Normal muscle tone.  Skin: no rashes, lesions, ulcers on limited dermatological examination. Neurologic: CN 2-12 grossly intact. Sensation intact, DTR normal. Strength 5/5 in all 4.  Psychiatric: Normal judgment and insight. Alert and oriented x 3. Normal mood.   Labs on Admission: I have personally reviewed following labs and imaging studies  CBC: Recent Labs  Lab 10/06/18 2049  WBC 5.5  NEUTROABS 4.0  HGB 11.2*  HCT 36.3  MCV 98.1  PLT 683   Basic  Metabolic Panel: Recent Labs  Lab 10/06/18 2049  NA 139  K 3.6  CL 102  CO2 27  GLUCOSE 106*  BUN 20  CREATININE 0.66  CALCIUM 9.0   GFR: Estimated Creatinine Clearance: 74.3 mL/min (by C-G formula based on SCr of 0.66 mg/dL). Liver Function Tests: Recent Labs  Lab 10/06/18 2049  AST 23  ALT 21  ALKPHOS 54  BILITOT 1.1  PROT 6.8  ALBUMIN 3.7  No results for input(s): LIPASE, AMYLASE in the last 168 hours. No results for input(s): AMMONIA in the last 168 hours. Coagulation Profile: No results for input(s): INR, PROTIME in the last 168 hours. Cardiac Enzymes: Recent Labs  Lab 10/06/18 2049  TROPONINI 0.05*   BNP (last 3 results) No results for input(s): PROBNP in the last 8760 hours. HbA1C: No results for input(s): HGBA1C in the last 72 hours. CBG: No results for input(s): GLUCAP in the last 168 hours. Lipid Profile: No results for input(s): CHOL, HDL, LDLCALC, TRIG, CHOLHDL, LDLDIRECT in the last 72 hours. Thyroid Function Tests: No results for input(s): TSH, T4TOTAL, FREET4, T3FREE, THYROIDAB in the last 72 hours. Anemia Panel: No results for input(s): VITAMINB12, FOLATE, FERRITIN, TIBC, IRON, RETICCTPCT in the last 72 hours. Urine analysis:    Component Value Date/Time   COLORURINE YELLOW 10/06/2018 2200   APPEARANCEUR HAZY (A) 10/06/2018 2200   LABSPEC 1.006 10/06/2018 2200   PHURINE 7.0 10/06/2018 2200   GLUCOSEU NEGATIVE 10/06/2018 2200   HGBUR NEGATIVE 10/06/2018 2200   BILIRUBINUR NEGATIVE 10/06/2018 2200   KETONESUR NEGATIVE 10/06/2018 2200   PROTEINUR NEGATIVE 10/06/2018 2200   NITRITE NEGATIVE 10/06/2018 2200   LEUKOCYTESUR NEGATIVE 10/06/2018 2200    Radiological Exams on Admission: Dg Chest 2 View  Result Date: 10/06/2018 CLINICAL DATA:  Right flank pain and shortness of breath. History of CHF, COPD, and metastatic breast cancer. EXAM: CHEST - 2 VIEW COMPARISON:  Chest radiographs and CT 09/19/2018 FINDINGS: The cardiac silhouette is  borderline enlarged. The lungs are hypoinflated with persistent pulmonary vascular congestion. Mild left and minimal right basilar opacities are compatible with atelectasis. No pleural effusion or pneumothorax is identified. No acute osseous abnormality is seen. IMPRESSION: Pulmonary vascular congestion and mild bibasilar atelectasis. Electronically Signed   By: Logan Bores M.D.   On: 10/06/2018 21:47   Ct Angio Chest Pe W And/or Wo Contrast  Result Date: 10/06/2018 CLINICAL DATA:  Shortness of breath and elevated D-dimer. Right sided back pain. EXAM: CT ANGIOGRAPHY CHEST WITH CONTRAST TECHNIQUE: Multidetector CT imaging of the chest was performed using the standard protocol during bolus administration of intravenous contrast. Multiplanar CT image reconstructions and MIPs were obtained to evaluate the vascular anatomy. CONTRAST:  16mL ISOVUE-370 IOPAMIDOL (ISOVUE-370) INJECTION 76% COMPARISON:  09/19/2018 FINDINGS: Cardiovascular: Pulmonary arterial opacification is adequate without evidence of emboli. Thoracic aortic atherosclerosis is noted without aneurysm. There is coronary artery atherosclerosis and mild cardiomegaly. There is no pericardial effusion. Mediastinum/Nodes: 3 cm upper outer right breast mass with calcifications, incompletely imaged. No enlarged axillary, mediastinal, or hilar lymph nodes. Grossly unremarkable esophagus and thyroid. Lungs/Pleura: Trace bilateral pleural effusions, decreased from prior. Unchanged 8 mm right upper lobe nodule (series 6, image 22). Bibasilar atelectasis. Mild centrilobular emphysema. Upper Abdomen: Unremarkable. Musculoskeletal: Unchanged 7 mm lucent lesion in the upper sternal body on the right with suggestion of some coarsened internal trabeculation which may reflect a benign hemangioma. No acute osseous abnormality. Moderate lower thoracic disc degeneration. Review of the MIP images confirms the above findings. IMPRESSION: 1. No evidence of pulmonary emboli.  2. Trace bilateral pleural effusions, decreased from prior. 3. Partially visualized 3 cm outer right breast mass. Correlate with mammography. 4. Unchanged 8 mm right upper lobe nodule with follow-up recommendations as on the prior CT. 5. Aortic Atherosclerosis (ICD10-I70.0) and Emphysema (ICD10-J43.9). Electronically Signed   By: Logan Bores M.D.   On: 10/06/2018 23:44   Ct Renal Stone Study  Result Date: 10/06/2018 CLINICAL DATA:  Right  flank pain. History of metastatic breast cancer. EXAM: CT ABDOMEN AND PELVIS WITHOUT CONTRAST TECHNIQUE: Multidetector CT imaging of the abdomen and pelvis was performed following the standard protocol without IV contrast. COMPARISON:  07/14/2018 FINDINGS: Lower chest: Mild atelectasis in the lung bases. No pleural effusion. Borderline enlarged heart. Coronary artery calcification. Hepatobiliary: No focal liver abnormality is seen. No gallstones, gallbladder wall thickening, or biliary dilatation. Pancreas: Unremarkable. Spleen: Unremarkable. Adrenals/Urinary Tract: Unremarkable adrenal glands. Minimal fullness of the right renal collecting system. No urolithiasis. No ureteral dilatation. Unchanged 4 mm hyperdense lesion in the interpolar right kidney, possibly a proteinaceous or hemorrhagic cyst. Unchanged suspected 10 mm cyst in the upper pole of the left kidney. Decompressed bladder. Stomach/Bowel: The stomach is within normal limits. Sigmoid colon diverticulosis is noted without evidence of diverticulitis. There is no evidence of bowel obstruction. The appendix is not clearly identified, however no inflammatory changes are seen in the right lower quadrant. Vascular/Lymphatic: Abdominal aortic atherosclerosis without aneurysm. No enlarged lymph nodes. Reproductive: Status post hysterectomy. No adnexal masses. Other: No intraperitoneal free fluid. Unchanged fat containing periumbilical hernia. Unchanged benign appearing calcification in the left lower omentum.  Musculoskeletal: Remote left inferior pubic ramus fracture. Unchanged sclerotic L2 vertebral body lesion. Transitional L5 segment. Severe lower lumbar facet arthrosis asymmetric on the left. IMPRESSION: 1. No urolithiasis, evidence of significant urinary tract obstruction, or other acute abnormality identified in the abdomen or pelvis. 2. Chronic findings as above including a sclerotic L2 vertebral metastasis. 3.  Aortic Atherosclerosis (ICD10-I70.0). Electronically Signed   By: Logan Bores M.D.   On: 10/06/2018 21:57   09/20/2018 echocardiogram complete ------------------------------------------------------------------- Indications:      CHF - 428.0.  ------------------------------------------------------------------- History:   PMH:  Former Smoker. LBBB. Breast Cancer.  Coronary artery disease.  Congestive heart failure.  PMH:   Myocardial infarction.  Risk factors:  Hypertension.  ------------------------------------------------------------------- Study Conclusions  - Left ventricle: The cavity size was normal. Wall thickness was   increased in a pattern of mild LVH. Systolic function was   moderately to severely reduced. The estimated ejection fraction   was 30%. Diffuse hypokinesis. Doppler parameters are consistent   with restrictive physiology, indicative of decreased left   ventricular diastolic compliance and/or increased left atrial   pressure. Doppler parameters are consistent with high ventricular   filling pressure. - Aortic valve: Trileaflet; moderately calcified leaflets.   Morphologically, there was at least mild calcific aortic   stenosis. - Mitral valve: Mildly calcified annulus. Mildly thickened leaflets   . There was mild regurgitation. - Left atrium: The atrium was mildly dilated. - Atrial septum: No defect or patent foramen ovale was identified.  EKG: Independently reviewed. Vent. rate 98 BPM PR interval * ms QRS duration 148 ms QT/QTc 408/521 ms P-R-T  axes 26 -68 85 Sinus rhythm LBBB  Assessment/Plan Principal Problem:   Elevated troponin Likely demand ischemia from pain, tachycardia, HTN. No new EKG changes. Observation/telemetry. Continue aspirin, atorvastatin, clopidogrel and metoprolol.  Active Problems:   Back pain Acute back pain after undergoing PT at home. Single dose metoprolol 30 mg IVP. Continue oxycodone 10 mg every 4-6 hours as needed.. Start Robaxin 750 mg p.o. every 6 hours as needed. Check MRI L-S spine.    Chronic pain Analgesics as needed.    Hypertension Continue losartan 100 mg p.o. daily. Continue metoprolol 12.5 mg p.o. every evening. Continue furosemide 40 mg p.o. daily. Monitor BP, HR, renal function electrolytes.    CAD (coronary artery disease) Continue aspirin, atorvastatin, clopidogrel  and metoprolol.    Breast cancer [facility]) Treatment and follow-up per oncology team.    PSVT (paroxysmal supraventricular tachycardia) ([place]) On metoprolol.    Hyperlipidemia Continue atorvastatin 80 mg p.o. daily. Monitor LFTs as needed. Fasting lipid profile follow-up as an outpatient.    COPD (chronic obstructive pulmonary disease) ([facility]) Supplemental oxygen and bronchodilators as needed.    DVT prophylaxis: Heparin SQ. Code Status: Full code. Family Communication: Disposition Plan: Observation for troponin level trending. Consults called: Admission status: Observation/telemetry.   Reubin Milan MD [facility] Pager [phone].  If 7PM-7AM, please contact night-coverage [email or web address] Password [facility]  10/07/2018, 12:35 AM   This document was prepared using Dragon voice recognition software and may contain some unintended transcription errors. Marland Kitchen

## 2018-10-07 NOTE — ED Notes (Signed)
Pt requesting her IV be removed from her right AC, IV removed

## 2018-10-07 NOTE — Discharge Summary (Signed)
Physician Discharge Summary  Stacey [name]:[id number] DOB: 11-04-1946 DOA: 10/06/2018  PCP: Glenda Chroman, MD  Admit date: 10/06/2018 Discharge date: 10/07/2018  Admitted From: Home Disposition: Home  Recommendations for Outpatient Follow-up:  1. Follow up with PCP in 1-2 weeks 2. Please obtain BMP/CBC in one week 3. Please follow up with your cancer doctor at [facility] for consideration of possible radiation for the spine.  Ts:  Home Health: Yes, resume HHPT Equipment/Devices: rolling walker  Discharge Condition: stable CODE STATUS: FULL Diet recommendation: cardiac diet  Brief/Interim Summary:  #) Acute on chronic low back pain: Patient was admitted with acute on chronic low back pain.  Patient has chronic low back pain for multiple reasons including degenerative disc disease, spinal arthritis, known metastatic disease from breast cancer to spine.  Apparently while working with physical therapy she pulled a muscle and had acute and severe back pain.  Her back pain was managed conservatively here and she was discharged home to continue physical therapy.  She will follow-up as an outpatient for her oncologist at [facility] to see if she might benefit from additional radiation to the spinal lesion.  He was continued on her home gabapentin, cyclamens pain, oxycodone, methocarbamol.  #) Coronary artery disease status post stent: Patient has had multiple stents in the past.  She was continued on home aspirin, atorvastatin, clopidogrel, metoprolol, losartan.  Her troponin was flat at 0.05.  Her EKG showed no ischemic changes.    #) Chronic ischemic systolic heart failure: Patient did have any echo recently on 09/20/2018 that showed an EF of about 30% with diffuse hypokinesis.  She was maintained on her home beta-blocker, diuretics.  She was felt to be euvolemic on discharge.  #) COPD: Patient was unstable on home 2 L.  She was continued on her home PRN bronchodilators as well as her home  budesonide.  #) Metastatic breast cancer: Patient has known metastatic disease to lungs, lymph nodes, spine.  She is continued on her home letrozole.  Discharge Diagnoses:  Principal Problem:   Elevated troponin Active Problems:   Hypertension   CAD (coronary artery disease)   Breast cancer (HCC)   PSVT (paroxysmal supraventricular tachycardia) (HCC)   Hyperlipidemia   COPD (chronic obstructive pulmonary disease) (HCC)   Chronic pain   Acute back pain    Discharge Instructions  Discharge Instructions    Call MD for:  difficulty breathing, headache or visual disturbances   Complete by:  As directed    Call MD for:  hives   Complete by:  As directed    Call MD for:  persistant dizziness or light-headedness   Complete by:  As directed    Call MD for:  persistant nausea and vomiting   Complete by:  As directed    Call MD for:  redness, tenderness, or signs of infection (pain, swelling, redness, odor or green/yellow discharge around incision site)   Complete by:  As directed    Call MD for:  severe uncontrolled pain   Complete by:  As directed    Call MD for:  temperature >100.4   Complete by:  As directed    Diet - low sodium heart healthy   Complete by:  As directed    Discharge instructions   Complete by:  As directed    Please follow-up with your primary care doctor in 1 week.  Please follow-up with your cancer doctor at [facility] for consideration of possible radiation.   Increase activity slowly  Complete by:  As directed      Allergies as of 10/07/2018      Reactions   Brilinta [ticagrelor] Other (See Comments)   Nausea and severe diarrhea- general weakness. Patient says does not want to take again      Medication List    STOP taking these medications   ipratropium 0.02 % nebulizer solution Commonly known as:  ATROVENT   Potassium Chloride ER 20 MEQ Tbcr     TAKE these medications   albuterol 108 (90 Base) MCG/ACT inhaler Commonly known as:  PROVENTIL  HFA;VENTOLIN HFA Inhale 2 puffs into the lungs every 6 (six) hours as needed for wheezing or shortness of breath. What changed:  Another medication with the same name was removed. Continue taking this medication, and follow the directions you see here.   aspirin 81 MG chewable tablet Chew 1 tablet (81 mg total) by mouth daily. What changed:  when to take this   atorvastatin 80 MG tablet Commonly known as:  LIPITOR Take 80 mg by mouth every morning.   b complex vitamins tablet Take 1 tablet by mouth every morning.   budesonide 0.25 MG/2ML nebulizer solution Commonly known as:  PULMICORT Take 2 mLs (0.25 mg total) by nebulization 2 (two) times daily.   CITRACAL MAXIMUM 315-250 MG-UNIT Tabs Generic drug:  Calcium Citrate-Vitamin D Take 2 tablets by mouth 2 (two) times daily.   clopidogrel 75 MG tablet Commonly known as:  PLAVIX Take 1 tablet (75 mg total) by mouth daily. What changed:  when to take this   cyclobenzaprine 10 MG tablet Commonly known as:  FLEXERIL Take 10 mg by mouth 3 (three) times daily as needed for muscle spasms.   EQ VISION FORMULA 50+ PO Take 1 tablet by mouth every morning.   HAIR/SKIN/NAILS Caps Take 1 capsule by mouth every morning.   furosemide 40 MG tablet Commonly known as:  LASIX Take 1 tablet (40 mg total) by mouth daily. What changed:  when to take this   gabapentin 300 MG capsule Commonly known as:  NEURONTIN Take 300 mg by mouth every morning.   gabapentin 400 MG capsule Commonly known as:  NEURONTIN Take 800 mg by mouth at bedtime.   ipratropium-albuterol 0.5-2.5 (3) MG/3ML Soln Commonly known as:  DUONEB Take 3 mLs by nebulization 2 (two) times daily. *May do two additional treatments as needed for shortness of breath with Pulmicort   letrozole 2.5 MG tablet Commonly known as:  FEMARA Take 2.5 mg by mouth at bedtime.   losartan 50 MG tablet Commonly known as:  COZAAR Take 1 tablet (50 mg total) by mouth daily. What changed:     how much to take  when to take this   metoprolol succinate 25 MG 24 hr tablet Commonly known as:  TOPROL-XL Take 0.5 tablets by mouth every evening.   nitroGLYCERIN 0.4 MG SL tablet Commonly known as:  NITROSTAT Place 1 tablet (0.4 mg total) under the tongue every 5 (five) minutes as needed. What changed:  reasons to take this   Oxycodone HCl 10 MG Tabs Take 10 mg by mouth every 6 (six) hours as needed (for pain).   Potassium 99 MG Tabs Take 1 tablet by mouth every morning.   QUNOL COQ10/UBIQUINOL/MEGA 100 MG Caps Generic drug:  Ubiquinol Take 1 capsule by mouth every morning. QUNOL-Ultra COQ10       Allergies  Allergen Reactions  . Brilinta [Ticagrelor] Other (See Comments)    Nausea and severe diarrhea- general  weakness. Patient says does not want to take again    Consultations: None  Procedures/Studies: Dg Chest 2 View  Result Date: 10/06/2018 CLINICAL DATA:  Right flank pain and shortness of breath. History of CHF, COPD, and metastatic breast cancer. EXAM: CHEST - 2 VIEW COMPARISON:  Chest radiographs and CT 09/19/2018 FINDINGS: The cardiac silhouette is borderline enlarged. The lungs are hypoinflated with persistent pulmonary vascular congestion. Mild left and minimal right basilar opacities are compatible with atelectasis. No pleural effusion or pneumothorax is identified. No acute osseous abnormality is seen. IMPRESSION: Pulmonary vascular congestion and mild bibasilar atelectasis. Electronically Signed   By: Logan Bores M.D.   On: 10/06/2018 21:47   Dg Chest 2 View  Result Date: 09/19/2018 CLINICAL DATA:  Shortness of breath. EXAM: CHEST - 2 VIEW COMPARISON:  PA and lateral chest 11/30/2017. FINDINGS: There is cardiomegaly and vascular congestion. Small right pleural effusion basilar atelectasis noted. No pneumothorax. Aortic atherosclerosis is seen. IMPRESSION: Small right pleural effusion and basilar atelectasis. Cardiomegaly mild vascular congestion.  Atherosclerosis. Electronically Signed   By: Inge Rise M.D.   On: 09/19/2018 14:27   Ct Angio Chest Pe W And/or Wo Contrast  Result Date: 10/06/2018 CLINICAL DATA:  Shortness of breath and elevated D-dimer. Right sided back pain. EXAM: CT ANGIOGRAPHY CHEST WITH CONTRAST TECHNIQUE: Multidetector CT imaging of the chest was performed using the standard protocol during bolus administration of intravenous contrast. Multiplanar CT image reconstructions and MIPs were obtained to evaluate the vascular anatomy. CONTRAST:  166mL ISOVUE-370 IOPAMIDOL (ISOVUE-370) INJECTION 76% COMPARISON:  09/19/2018 FINDINGS: Cardiovascular: Pulmonary arterial opacification is adequate without evidence of emboli. Thoracic aortic atherosclerosis is noted without aneurysm. There is coronary artery atherosclerosis and mild cardiomegaly. There is no pericardial effusion. Mediastinum/Nodes: 3 cm upper outer right breast mass with calcifications, incompletely imaged. No enlarged axillary, mediastinal, or hilar lymph nodes. Grossly unremarkable esophagus and thyroid. Lungs/Pleura: Trace bilateral pleural effusions, decreased from prior. Unchanged 8 mm right upper lobe nodule (series 6, image 22). Bibasilar atelectasis. Mild centrilobular emphysema. Upper Abdomen: Unremarkable. Musculoskeletal: Unchanged 7 mm lucent lesion in the upper sternal body on the right with suggestion of some coarsened internal trabeculation which may reflect a benign hemangioma. No acute osseous abnormality. Moderate lower thoracic disc degeneration. Review of the MIP images confirms the above findings. IMPRESSION: 1. No evidence of pulmonary emboli. 2. Trace bilateral pleural effusions, decreased from prior. 3. Partially visualized 3 cm outer right breast mass. Correlate with mammography. 4. Unchanged 8 mm right upper lobe nodule with follow-up recommendations as on the prior CT. 5. Aortic Atherosclerosis (ICD10-I70.0) and Emphysema (ICD10-J43.9).  Electronically Signed   By: Logan Bores M.D.   On: 10/06/2018 23:44   Ct Angio Chest Pe W/cm &/or Wo Cm  Result Date: 09/19/2018 CLINICAL DATA:  Shortness of breath for 1 week, worsening. EXAM: CT ANGIOGRAPHY CHEST WITH CONTRAST TECHNIQUE: Multidetector CT imaging of the chest was performed using the standard protocol during bolus administration of intravenous contrast. Multiplanar CT image reconstructions and MIPs were obtained to evaluate the vascular anatomy. CONTRAST:  100 mL ISOVUE-370 IOPAMIDOL (ISOVUE-370) INJECTION 76% COMPARISON:  PA and lateral chest 11/30/2017. FINDINGS: Cardiovascular: No pulmonary embolus is identified. There is mild cardiomegaly. No pericardial effusion. Calcific aortic and coronary atherosclerosis noted. Mediastinum/Nodes: No enlarged mediastinal, hilar, or axillary lymph nodes. Thyroid gland, trachea, and esophagus demonstrate no significant findings. Lungs/Pleura: Trace left and small right pleural effusion are seen. Nodule in the right upper lobe measuring 0.8 cm on image 26 is  identified. There is some dependent atelectasis. Upper Abdomen: Negative. Musculoskeletal: No lytic or sclerotic lesion. Mid and lower thoracic degenerative disease noted. Review of the MIP images confirms the above findings. IMPRESSION: Negative for pulmonary embolus. 0.8 cm right upper lobe pulmonary nodule. Non-contrast chest CT at 6-12 months is recommended. If the nodule is stable at time of repeat CT, then future CT at 18-24 months (from today's scan) is considered optional for low-risk patients, but is recommended for high-risk patients. This recommendation follows the consensus statement: Guidelines for Management of Incidental Pulmonary Nodules Detected on CT Images: From the Fleischner Society 2017; Radiology 2017; 284:228-243. Small right and trace left pleural effusion. Cardiomegaly. Aortic Atherosclerosis (ICD10-I70.0) and Emphysema (ICD10-J43.9). Electronically Signed   By: Inge Rise M.D.   On: 09/19/2018 14:32   Ct Renal Stone Study  Result Date: 10/06/2018 CLINICAL DATA:  Right flank pain. History of metastatic breast cancer. EXAM: CT ABDOMEN AND PELVIS WITHOUT CONTRAST TECHNIQUE: Multidetector CT imaging of the abdomen and pelvis was performed following the standard protocol without IV contrast. COMPARISON:  07/14/2018 FINDINGS: Lower chest: Mild atelectasis in the lung bases. No pleural effusion. Borderline enlarged heart. Coronary artery calcification. Hepatobiliary: No focal liver abnormality is seen. No gallstones, gallbladder wall thickening, or biliary dilatation. Pancreas: Unremarkable. Spleen: Unremarkable. Adrenals/Urinary Tract: Unremarkable adrenal glands. Minimal fullness of the right renal collecting system. No urolithiasis. No ureteral dilatation. Unchanged 4 mm hyperdense lesion in the interpolar right kidney, possibly a proteinaceous or hemorrhagic cyst. Unchanged suspected 10 mm cyst in the upper pole of the left kidney. Decompressed bladder. Stomach/Bowel: The stomach is within normal limits. Sigmoid colon diverticulosis is noted without evidence of diverticulitis. There is no evidence of bowel obstruction. The appendix is not clearly identified, however no inflammatory changes are seen in the right lower quadrant. Vascular/Lymphatic: Abdominal aortic atherosclerosis without aneurysm. No enlarged lymph nodes. Reproductive: Status post hysterectomy. No adnexal masses. Other: No intraperitoneal free fluid. Unchanged fat containing periumbilical hernia. Unchanged benign appearing calcification in the left lower omentum. Musculoskeletal: Remote left inferior pubic ramus fracture. Unchanged sclerotic L2 vertebral body lesion. Transitional L5 segment. Severe lower lumbar facet arthrosis asymmetric on the left. IMPRESSION: 1. No urolithiasis, evidence of significant urinary tract obstruction, or other acute abnormality identified in the abdomen or pelvis. 2. Chronic  findings as above including a sclerotic L2 vertebral metastasis. 3.  Aortic Atherosclerosis (ICD10-I70.0). Electronically Signed   By: Logan Bores M.D.   On: 10/06/2018 21:57       Subjective:   Discharge Exam: Vitals:   10/07/18 0526 10/07/18 1007  BP: (!) 101/51   Pulse: 78   Resp: 20   Temp: 97.9 F (36.6 C)   SpO2: 97% 96%   Vitals:   10/07/18 0122 10/07/18 0128 10/07/18 0526 10/07/18 1007  BP:  (!) 153/66 (!) 101/51   Pulse:  92 78   Resp:  20 20   Temp:  98.1 F (36.7 C) 97.9 F (36.6 C)   TempSrc:  Oral Oral   SpO2:  (!) 82% 97% 96%  Weight: 114.3 kg     Height: 5' (1.524 m)       General: Pt is alert, awake, not in acute distress Cardiovascular: Distant heart sounds, regular rate and rhythm, no murmurs Respiratory: CTA bilaterally, no wheezing, no rhonchi Abdominal: Soft, NT, ND, bowel sounds + Extremities: Trace lower extremity edema    The results of significant diagnostics from this hospitalization (including imaging, microbiology, ancillary and laboratory) are listed below for  reference.     Microbiology: No results found for this or any previous visit (from the past 240 hour(s)).   Labs: BNP (last 3 results) Recent Labs    11/30/17 2325 09/19/18 1204 10/06/18 2049  BNP 50.1 368.0* 893.7*   Basic Metabolic Panel: Recent Labs  Lab 10/06/18 2049 10/07/18 0328  NA 139 138  K 3.6 4.2  CL 102 104  CO2 27 26  GLUCOSE 106* 151*  BUN 20 18  CREATININE 0.66 0.70  CALCIUM 9.0 9.0   Liver Function Tests: Recent Labs  Lab 10/06/18 2049 10/07/18 0328  AST 23 22  ALT 21 20  ALKPHOS 54 54  BILITOT 1.1 1.5*  PROT 6.8 6.6  ALBUMIN 3.7 3.5   No results for input(s): LIPASE, AMYLASE in the last 168 hours. No results for input(s): AMMONIA in the last 168 hours. CBC: Recent Labs  Lab 10/06/18 2049  WBC 5.5  NEUTROABS 4.0  HGB 11.2*  HCT 36.3  MCV 98.1  PLT 214   Cardiac Enzymes: Recent Labs  Lab 10/06/18 2049 10/07/18 0328   TROPONINI 0.05* 0.05*   BNP: Invalid input(s): POCBNP CBG: No results for input(s): GLUCAP in the last 168 hours. D-Dimer Recent Labs    10/06/18 2049  DDIMER 0.76*   Hgb A1c No results for input(s): HGBA1C in the last 72 hours. Lipid Profile No results for input(s): CHOL, HDL, LDLCALC, TRIG, CHOLHDL, LDLDIRECT in the last 72 hours. Thyroid function studies No results for input(s): TSH, T4TOTAL, T3FREE, THYROIDAB in the last 72 hours.  Invalid input(s): FREET3 Anemia work up No results for input(s): VITAMINB12, FOLATE, FERRITIN, TIBC, IRON, RETICCTPCT in the last 72 hours. Urinalysis    Component Value Date/Time   COLORURINE YELLOW 10/06/2018 2200   APPEARANCEUR HAZY (A) 10/06/2018 2200   LABSPEC 1.006 10/06/2018 2200   PHURINE 7.0 10/06/2018 2200   GLUCOSEU NEGATIVE 10/06/2018 2200   HGBUR NEGATIVE 10/06/2018 2200   BILIRUBINUR NEGATIVE 10/06/2018 2200   KETONESUR NEGATIVE 10/06/2018 2200   PROTEINUR NEGATIVE 10/06/2018 2200   NITRITE NEGATIVE 10/06/2018 2200   LEUKOCYTESUR NEGATIVE 10/06/2018 2200   Sepsis Labs Invalid input(s): PROCALCITONIN,  WBC,  LACTICIDVEN Microbiology No results found for this or any previous visit (from the past 240 hour(s)).   Time coordinating discharge: 35  SIGNED:   Cristy Folks, MD  [facility] 10/07/2018, 10:17 AM   If 7PM-7AM, please contact night-coverage [email or web address] Password [email or web address]

## 2018-11-13 ENCOUNTER — Inpatient Hospital Stay (HOSPITAL_COMMUNITY)
Admission: EM | Admit: 2018-11-13 | Discharge: 2018-11-14 | DRG: 309 | Disposition: A | Discharge status: Home / Self Care | Payer: Medicare Other | Attending: Internal Medicine | Admitting: Internal Medicine

## 2018-11-13 ENCOUNTER — Other Ambulatory Visit: Payer: Self-pay

## 2018-11-13 ENCOUNTER — Emergency Department (HOSPITAL_COMMUNITY): Payer: Medicare Other

## 2018-11-13 ENCOUNTER — Encounter (HOSPITAL_COMMUNITY): Payer: Self-pay | Admitting: Emergency Medicine

## 2018-11-13 DIAGNOSIS — Z7902 Long term (current) use of antithrombotics/antiplatelets: Secondary | ICD-10-CM

## 2018-11-13 DIAGNOSIS — I471 Supraventricular tachycardia: Secondary | ICD-10-CM | POA: Diagnosis present

## 2018-11-13 DIAGNOSIS — I255 Ischemic cardiomyopathy: Secondary | ICD-10-CM | POA: Diagnosis present

## 2018-11-13 DIAGNOSIS — I480 Paroxysmal atrial fibrillation: Secondary | ICD-10-CM | POA: Diagnosis present

## 2018-11-13 DIAGNOSIS — J449 Chronic obstructive pulmonary disease, unspecified: Secondary | ICD-10-CM | POA: Diagnosis present

## 2018-11-13 DIAGNOSIS — I110 Hypertensive heart disease with heart failure: Secondary | ICD-10-CM | POA: Diagnosis present

## 2018-11-13 DIAGNOSIS — R42 Dizziness and giddiness: Secondary | ICD-10-CM | POA: Diagnosis present

## 2018-11-13 DIAGNOSIS — Z79899 Other long term (current) drug therapy: Secondary | ICD-10-CM

## 2018-11-13 DIAGNOSIS — W010XXA Fall on same level from slipping, tripping and stumbling without subsequent striking against object, initial encounter: Secondary | ICD-10-CM | POA: Diagnosis present

## 2018-11-13 DIAGNOSIS — G8929 Other chronic pain: Secondary | ICD-10-CM | POA: Diagnosis present

## 2018-11-13 DIAGNOSIS — J9611 Chronic respiratory failure with hypoxia: Secondary | ICD-10-CM | POA: Diagnosis present

## 2018-11-13 DIAGNOSIS — E66813 Obesity, class 3: Secondary | ICD-10-CM

## 2018-11-13 DIAGNOSIS — Z9981 Dependence on supplemental oxygen: Secondary | ICD-10-CM | POA: Diagnosis not present

## 2018-11-13 DIAGNOSIS — Z888 Allergy status to other drugs, medicaments and biological substances status: Secondary | ICD-10-CM

## 2018-11-13 DIAGNOSIS — C7951 Secondary malignant neoplasm of bone: Secondary | ICD-10-CM | POA: Diagnosis present

## 2018-11-13 DIAGNOSIS — C50919 Malignant neoplasm of unspecified site of unspecified female breast: Secondary | ICD-10-CM | POA: Diagnosis present

## 2018-11-13 DIAGNOSIS — Z87891 Personal history of nicotine dependence: Secondary | ICD-10-CM

## 2018-11-13 DIAGNOSIS — I447 Left bundle-branch block, unspecified: Secondary | ICD-10-CM | POA: Diagnosis present

## 2018-11-13 DIAGNOSIS — R002 Palpitations: Secondary | ICD-10-CM | POA: Diagnosis present

## 2018-11-13 DIAGNOSIS — E785 Hyperlipidemia, unspecified: Secondary | ICD-10-CM | POA: Diagnosis present

## 2018-11-13 DIAGNOSIS — E662 Morbid (severe) obesity with alveolar hypoventilation: Secondary | ICD-10-CM | POA: Diagnosis present

## 2018-11-13 DIAGNOSIS — Z853 Personal history of malignant neoplasm of breast: Secondary | ICD-10-CM

## 2018-11-13 DIAGNOSIS — Z79811 Long term (current) use of aromatase inhibitors: Secondary | ICD-10-CM | POA: Diagnosis not present

## 2018-11-13 DIAGNOSIS — W19XXXA Unspecified fall, initial encounter: Secondary | ICD-10-CM

## 2018-11-13 DIAGNOSIS — I252 Old myocardial infarction: Secondary | ICD-10-CM | POA: Diagnosis not present

## 2018-11-13 DIAGNOSIS — Z7982 Long term (current) use of aspirin: Secondary | ICD-10-CM

## 2018-11-13 DIAGNOSIS — I2510 Atherosclerotic heart disease of native coronary artery without angina pectoris: Secondary | ICD-10-CM | POA: Diagnosis present

## 2018-11-13 DIAGNOSIS — I5042 Chronic combined systolic (congestive) and diastolic (congestive) heart failure: Secondary | ICD-10-CM | POA: Diagnosis present

## 2018-11-13 DIAGNOSIS — Z7951 Long term (current) use of inhaled steroids: Secondary | ICD-10-CM

## 2018-11-13 DIAGNOSIS — Z955 Presence of coronary angioplasty implant and graft: Secondary | ICD-10-CM | POA: Diagnosis not present

## 2018-11-13 DIAGNOSIS — I4891 Unspecified atrial fibrillation: Secondary | ICD-10-CM

## 2018-11-13 DIAGNOSIS — Z6841 Body Mass Index (BMI) 40.0 and over, adult: Secondary | ICD-10-CM

## 2018-11-13 DIAGNOSIS — R0602 Shortness of breath: Secondary | ICD-10-CM

## 2018-11-13 MED ORDER — IPRATROPIUM-ALBUTEROL 0.5-2.5 (3) MG/3ML IN SOLN
3.0000 mL | Freq: Once | RESPIRATORY_TRACT | Status: AC
  Administered 2018-11-13: 3 mL via RESPIRATORY_TRACT
  Filled 2018-11-13: qty 3

## 2018-11-13 NOTE — ED Notes (Signed)
Notified respiratory and lab

## 2018-11-13 NOTE — ED Triage Notes (Signed)
Pt comes in for a fall that occurred this morning and hit her head. Pt states she felt dizzy causing her to fall, unknown LOC. 81mg  ASA daily.

## 2018-11-14 DIAGNOSIS — W19XXXA Unspecified fall, initial encounter: Secondary | ICD-10-CM

## 2018-11-14 DIAGNOSIS — R0602 Shortness of breath: Secondary | ICD-10-CM

## 2018-11-14 DIAGNOSIS — I5042 Chronic combined systolic (congestive) and diastolic (congestive) heart failure: Secondary | ICD-10-CM | POA: Diagnosis present

## 2018-11-14 DIAGNOSIS — R002 Palpitations: Secondary | ICD-10-CM | POA: Diagnosis present

## 2018-11-14 DIAGNOSIS — Z888 Allergy status to other drugs, medicaments and biological substances status: Secondary | ICD-10-CM | POA: Diagnosis not present

## 2018-11-14 DIAGNOSIS — I4891 Unspecified atrial fibrillation: Secondary | ICD-10-CM

## 2018-11-14 DIAGNOSIS — W010XXA Fall on same level from slipping, tripping and stumbling without subsequent striking against object, initial encounter: Secondary | ICD-10-CM | POA: Diagnosis present

## 2018-11-14 DIAGNOSIS — J9611 Chronic respiratory failure with hypoxia: Secondary | ICD-10-CM

## 2018-11-14 DIAGNOSIS — I10 Essential (primary) hypertension: Secondary | ICD-10-CM

## 2018-11-14 DIAGNOSIS — E66813 Obesity, class 3: Secondary | ICD-10-CM

## 2018-11-14 DIAGNOSIS — J449 Chronic obstructive pulmonary disease, unspecified: Secondary | ICD-10-CM | POA: Diagnosis present

## 2018-11-14 DIAGNOSIS — Z7902 Long term (current) use of antithrombotics/antiplatelets: Secondary | ICD-10-CM | POA: Diagnosis not present

## 2018-11-14 DIAGNOSIS — Z955 Presence of coronary angioplasty implant and graft: Secondary | ICD-10-CM | POA: Diagnosis not present

## 2018-11-14 DIAGNOSIS — C50919 Malignant neoplasm of unspecified site of unspecified female breast: Secondary | ICD-10-CM | POA: Diagnosis present

## 2018-11-14 DIAGNOSIS — I480 Paroxysmal atrial fibrillation: Secondary | ICD-10-CM | POA: Diagnosis present

## 2018-11-14 DIAGNOSIS — Z6841 Body Mass Index (BMI) 40.0 and over, adult: Secondary | ICD-10-CM | POA: Diagnosis not present

## 2018-11-14 DIAGNOSIS — E662 Morbid (severe) obesity with alveolar hypoventilation: Secondary | ICD-10-CM | POA: Diagnosis present

## 2018-11-14 DIAGNOSIS — Z853 Personal history of malignant neoplasm of breast: Secondary | ICD-10-CM | POA: Diagnosis not present

## 2018-11-14 DIAGNOSIS — C7951 Secondary malignant neoplasm of bone: Secondary | ICD-10-CM | POA: Diagnosis present

## 2018-11-14 DIAGNOSIS — Z9981 Dependence on supplemental oxygen: Secondary | ICD-10-CM | POA: Diagnosis not present

## 2018-11-14 DIAGNOSIS — E785 Hyperlipidemia, unspecified: Secondary | ICD-10-CM | POA: Diagnosis present

## 2018-11-14 DIAGNOSIS — I447 Left bundle-branch block, unspecified: Secondary | ICD-10-CM | POA: Diagnosis present

## 2018-11-14 DIAGNOSIS — Z87891 Personal history of nicotine dependence: Secondary | ICD-10-CM | POA: Diagnosis not present

## 2018-11-14 DIAGNOSIS — I2510 Atherosclerotic heart disease of native coronary artery without angina pectoris: Secondary | ICD-10-CM | POA: Diagnosis present

## 2018-11-14 DIAGNOSIS — I252 Old myocardial infarction: Secondary | ICD-10-CM | POA: Diagnosis not present

## 2018-11-14 DIAGNOSIS — Z79811 Long term (current) use of aromatase inhibitors: Secondary | ICD-10-CM | POA: Diagnosis not present

## 2018-11-14 DIAGNOSIS — R42 Dizziness and giddiness: Secondary | ICD-10-CM | POA: Diagnosis present

## 2018-11-14 DIAGNOSIS — G8929 Other chronic pain: Secondary | ICD-10-CM | POA: Diagnosis present

## 2018-11-14 DIAGNOSIS — I110 Hypertensive heart disease with heart failure: Secondary | ICD-10-CM | POA: Diagnosis present

## 2018-11-14 DIAGNOSIS — I471 Supraventricular tachycardia: Secondary | ICD-10-CM | POA: Diagnosis present

## 2018-11-14 DIAGNOSIS — I255 Ischemic cardiomyopathy: Secondary | ICD-10-CM | POA: Diagnosis present

## 2018-11-14 LAB — CBC WITH DIFFERENTIAL/PLATELET
Abs Immature Granulocytes: 0.02 10*3/uL (ref 0.00–0.07)
BASOS PCT: 0 %
Basophils Absolute: 0 10*3/uL (ref 0.0–0.1)
EOS ABS: 0.2 10*3/uL (ref 0.0–0.5)
Eosinophils Relative: 3 %
HCT: 35.3 % — ABNORMAL LOW (ref 36.0–46.0)
Hemoglobin: 10.6 g/dL — ABNORMAL LOW (ref 12.0–15.0)
IMMATURE GRANULOCYTES: 0 %
Lymphocytes Relative: 19 %
Lymphs Abs: 0.9 10*3/uL (ref 0.7–4.0)
MCH: 29.7 pg (ref 26.0–34.0)
MCHC: 30 g/dL (ref 30.0–36.0)
MCV: 98.9 fL (ref 80.0–100.0)
MONOS PCT: 7 %
Monocytes Absolute: 0.3 10*3/uL (ref 0.1–1.0)
NEUTROS PCT: 71 %
Neutro Abs: 3.3 10*3/uL (ref 1.7–7.7)
PLATELETS: 215 10*3/uL (ref 150–400)
RBC: 3.57 MIL/uL — AB (ref 3.87–5.11)
RDW: 14.6 % (ref 11.5–15.5)
WBC: 4.7 10*3/uL (ref 4.0–10.5)
nRBC: 0 % (ref 0.0–0.2)

## 2018-11-14 LAB — URINALYSIS, ROUTINE W REFLEX MICROSCOPIC
Bilirubin Urine: NEGATIVE
Glucose, UA: NEGATIVE mg/dL
Hgb urine dipstick: NEGATIVE
Ketones, ur: NEGATIVE mg/dL
LEUKOCYTES UA: NEGATIVE
NITRITE: NEGATIVE
Protein, ur: NEGATIVE mg/dL
SPECIFIC GRAVITY, URINE: 1.017 (ref 1.005–1.030)
pH: 5 (ref 5.0–8.0)

## 2018-11-14 LAB — COMPREHENSIVE METABOLIC PANEL
ALT: 31 U/L (ref 0–44)
AST: 27 U/L (ref 15–41)
Albumin: 3.6 g/dL (ref 3.5–5.0)
Alkaline Phosphatase: 57 U/L (ref 38–126)
Anion gap: 8 (ref 5–15)
BUN: 24 mg/dL — ABNORMAL HIGH (ref 8–23)
CHLORIDE: 104 mmol/L (ref 98–111)
CO2: 29 mmol/L (ref 22–32)
Calcium: 9 mg/dL (ref 8.9–10.3)
Creatinine, Ser: 0.79 mg/dL (ref 0.44–1.00)
Glucose, Bld: 117 mg/dL — ABNORMAL HIGH (ref 70–99)
Potassium: 3.7 mmol/L (ref 3.5–5.1)
Sodium: 141 mmol/L (ref 135–145)
Total Bilirubin: 1.7 mg/dL — ABNORMAL HIGH (ref 0.3–1.2)
Total Protein: 6.7 g/dL (ref 6.5–8.1)

## 2018-11-14 LAB — PROTIME-INR
INR: 1.14
PROTHROMBIN TIME: 14.5 s (ref 11.4–15.2)

## 2018-11-14 LAB — TSH: TSH: 1.875 u[IU]/mL (ref 0.350–4.500)

## 2018-11-14 LAB — TROPONIN I: Troponin I: <0.03 ng/mL (ref <0.03)

## 2018-11-14 LAB — MAGNESIUM: MAGNESIUM: 1.9 mg/dL (ref 1.7–2.4)

## 2018-11-14 LAB — APTT: aPTT: 121 seconds — ABNORMAL HIGH (ref 24–36)

## 2018-11-14 MED ORDER — FUROSEMIDE 40 MG PO TABS
40.0000 mg | ORAL_TABLET | Freq: Every morning | ORAL | Status: DC
  Administered 2018-11-14: 40 mg via ORAL
  Filled 2018-11-14: qty 1

## 2018-11-14 MED ORDER — HEPARIN (PORCINE) 25000 UT/250ML-% IV SOLN
1050.0000 [IU]/h | INTRAVENOUS | Status: DC
  Administered 2018-11-14: 1050 [IU]/h via INTRAVENOUS
  Filled 2018-11-14: qty 250

## 2018-11-14 MED ORDER — BUDESONIDE 0.25 MG/2ML IN SUSP
0.2500 mg | Freq: Two times a day (BID) | RESPIRATORY_TRACT | Status: DC
  Administered 2018-11-14: 0.25 mg via RESPIRATORY_TRACT
  Filled 2018-11-14: qty 2

## 2018-11-14 MED ORDER — LETROZOLE 2.5 MG PO TABS
2.5000 mg | ORAL_TABLET | Freq: Every day | ORAL | Status: DC
  Filled 2018-11-14: qty 1

## 2018-11-14 MED ORDER — LOSARTAN POTASSIUM 50 MG PO TABS: 100.0000 mg | ORAL_TABLET | Freq: Every morning | ORAL | Status: DC

## 2018-11-14 MED ORDER — METOPROLOL TARTRATE 5 MG/5ML IV SOLN
5.0000 mg | Freq: Once | INTRAVENOUS | Status: AC
  Administered 2018-11-14: 5 mg via INTRAVENOUS
  Filled 2018-11-14: qty 5

## 2018-11-14 MED ORDER — METOPROLOL SUCCINATE ER 25 MG PO TB24
25.0000 mg | ORAL_TABLET | Freq: Every day | ORAL | Status: DC
  Administered 2018-11-14: 25 mg via ORAL
  Filled 2018-11-14: qty 1

## 2018-11-14 MED ORDER — ATORVASTATIN CALCIUM 40 MG PO TABS
80.0000 mg | ORAL_TABLET | Freq: Every morning | ORAL | Status: DC
  Administered 2018-11-14: 80 mg via ORAL
  Filled 2018-11-14: qty 2

## 2018-11-14 MED ORDER — LEVALBUTEROL HCL 0.63 MG/3ML IN NEBU: 0.6300 mg | INHALATION_SOLUTION | Freq: Four times a day (QID) | RESPIRATORY_TRACT | Status: DC | PRN

## 2018-11-14 MED ORDER — B COMPLEX-C PO TABS
1.0000 | ORAL_TABLET | Freq: Every morning | ORAL | Status: DC
  Administered 2018-11-14: 1 via ORAL
  Filled 2018-11-14 (×2): qty 1

## 2018-11-14 MED ORDER — SODIUM CHLORIDE 0.9% FLUSH
3.0000 mL | Freq: Two times a day (BID) | INTRAVENOUS | Status: DC
  Administered 2018-11-14: 3 mL via INTRAVENOUS

## 2018-11-14 MED ORDER — ASPIRIN 81 MG PO CHEW
81.0000 mg | CHEWABLE_TABLET | Freq: Every day | ORAL | Status: DC
  Administered 2018-11-14: 81 mg via ORAL
  Filled 2018-11-14: qty 1

## 2018-11-14 MED ORDER — DILTIAZEM LOAD VIA INFUSION
15.0000 mg | Freq: Once | INTRAVENOUS | Status: AC
  Administered 2018-11-14: 15 mg via INTRAVENOUS
  Filled 2018-11-14: qty 15

## 2018-11-14 MED ORDER — CALCIUM CARBONATE-VITAMIN D 500-200 MG-UNIT PO TABS
2.0000 | ORAL_TABLET | Freq: Two times a day (BID) | ORAL | Status: DC
  Administered 2018-11-14: 2 via ORAL
  Filled 2018-11-14: qty 2

## 2018-11-14 MED ORDER — LEVALBUTEROL TARTRATE 45 MCG/ACT IN AERO: 2.0000 | INHALATION_SPRAY | Freq: Three times a day (TID) | RESPIRATORY_TRACT | 3 refills | Status: DC | PRN

## 2018-11-14 MED ORDER — ONDANSETRON HCL 4 MG/2ML IJ SOLN: 4.0000 mg | Freq: Four times a day (QID) | INTRAMUSCULAR | Status: DC | PRN

## 2018-11-14 MED ORDER — OXYCODONE HCL 5 MG PO TABS: 10.0000 mg | ORAL_TABLET | Freq: Four times a day (QID) | ORAL | Status: DC | PRN

## 2018-11-14 MED ORDER — IPRATROPIUM BROMIDE 0.02 % IN SOLN: 0.5000 mg | Freq: Two times a day (BID) | RESPIRATORY_TRACT | 3 refills | Status: DC

## 2018-11-14 MED ORDER — METOPROLOL SUCCINATE ER 25 MG PO TB24: 25.0000 mg | ORAL_TABLET | Freq: Every evening | ORAL | Status: DC

## 2018-11-14 MED ORDER — IPRATROPIUM BROMIDE 0.02 % IN SOLN: 0.5000 mg | Freq: Four times a day (QID) | RESPIRATORY_TRACT | Status: DC | PRN

## 2018-11-14 MED ORDER — DILTIAZEM HCL-DEXTROSE 100-5 MG/100ML-% IV SOLN (PREMIX)
5.0000 mg/h | INTRAVENOUS | Status: DC
  Administered 2018-11-14: 5 mg/h via INTRAVENOUS
  Filled 2018-11-14 (×2): qty 100

## 2018-11-14 MED ORDER — CLOPIDOGREL BISULFATE 75 MG PO TABS
75.0000 mg | ORAL_TABLET | Freq: Every morning | ORAL | Status: DC
  Administered 2018-11-14: 75 mg via ORAL
  Filled 2018-11-14: qty 1

## 2018-11-14 MED ORDER — SODIUM CHLORIDE 0.9% FLUSH: 3.0000 mL | INTRAVENOUS | Status: DC | PRN

## 2018-11-14 MED ORDER — SODIUM CHLORIDE 0.9 % IV SOLN: 250.0000 mL | INTRAVENOUS | Status: DC | PRN

## 2018-11-14 MED ORDER — LEVALBUTEROL HCL 0.63 MG/3ML IN NEBU: 0.6300 mg | INHALATION_SOLUTION | Freq: Three times a day (TID) | RESPIRATORY_TRACT | 3 refills | Status: DC | PRN

## 2018-11-14 MED ORDER — GABAPENTIN 300 MG PO CAPS
300.0000 mg | ORAL_CAPSULE | Freq: Every morning | ORAL | Status: DC
  Administered 2018-11-14: 300 mg via ORAL
  Filled 2018-11-14: qty 1

## 2018-11-14 MED ORDER — CYCLOBENZAPRINE HCL 10 MG PO TABS: 10.0000 mg | ORAL_TABLET | Freq: Three times a day (TID) | ORAL | Status: DC | PRN

## 2018-11-14 MED ORDER — GABAPENTIN 400 MG PO CAPS: 800.0000 mg | ORAL_CAPSULE | Freq: Every day | ORAL | Status: DC

## 2018-11-14 MED ORDER — ONDANSETRON HCL 4 MG PO TABS: 4.0000 mg | ORAL_TABLET | Freq: Four times a day (QID) | ORAL | Status: DC | PRN

## 2018-11-14 MED ORDER — DILTIAZEM HCL 25 MG/5ML IV SOLN
20.0000 mg | Freq: Once | INTRAVENOUS | Status: AC
  Administered 2018-11-14: 20 mg via INTRAVENOUS

## 2018-11-14 MED ORDER — HEPARIN BOLUS VIA INFUSION
4000.0000 [IU] | Freq: Once | INTRAVENOUS | Status: AC
  Administered 2018-11-14: 4000 [IU] via INTRAVENOUS

## 2018-11-14 NOTE — ED Notes (Signed)
Lab in room.

## 2018-11-14 NOTE — ED Notes (Signed)
Pt in chair

## 2018-11-14 NOTE — Discharge Summary (Signed)
Physician Discharge Summary  Stacey [name] MCN:[id number] DOB: Apr 13, 1946 DOA: 11/13/2018  PCP: Glenda Chroman, MD  Admit date: 11/13/2018 Discharge date: 11/14/2018  Time spent: 35 minutes  Recommendations for Outpatient Follow-up:  1. Repeat basic metabolic panel to follow electrolytes and renal function 2. Patient will benefit of sleep study as an outpatient 3. Close follow-up with cardiology service to further evaluate for abnormal breathing and if needed initiate treatment with anticoagulation for secondary prevention.   Discharge Diagnoses:  Atrial fibrillation with rapid ventricular response ([place]) Fall Obesity, Class III, BMI 40-49.9 (morbid obesity) (HCC) Chronic respiratory failure with hypoxia (HCC) COPD Chronic systolic heart failure Essential hypertension History of breast cancer  Discharge Condition: Stable and improved.  Patient discharged home with instruction to follow-up with PCP, cardiology service and pulmonologist as previously instructed.  Diet recommendation: Heart healthy and low-calorie diet.  Filed Weights   11/13/18 2251 11/14/18 0708  Weight: 114.8 kg 116 kg    History of present illness:  As per H&P written by Dr.Lama on 11/14/2018 72 y.o. 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  As per patient while she was bending over sink to rinse her mouth after breathing treatment she got up too quickly and felt lightheaded she leaned backwards to sitting on the walker however she lost balance and fell.  As per patient she may have lost consciousness for few seconds however when she woke up she had a headache in the frontal area. In the ED CT of the head showed no acute abnormality. Patient was given albuterol for dyspnea, she does have history of  COPD Patient went into A. fib with RVR and started on IV Cardizem. Patient does not have any previous history of A. fib She denies chest pain but complains of chest discomfort due to palpitations. Denies nausea vomiting or diarrhea. Denies abdominal pain. No previous history of stroke or seizures  Hospital Course:  1-atrial fibrillation with RVR/??PAF: Transitient and easily resolve after Cardizem drip. -Patient reported intermittent palpitations felt every single time that she uses albuterol at home -Actively seen by cardiology as an outpatient who has make adjustment on her metoprolol dose on Thursday, 11/11/2018 -We will resume full dose of albuterol as recently adjusted by her cardiologist -Discontinue albuterol and use Xopenex instead -Patient will benefit of outpatient event monitoring and further decision if needed to initiate treatment with anticoagulation for secondary prevention. -At discharge patient with sinus rhythm and rate controlled.  2-mechanical fall -CT head showed no acute abnormalities -Patient feels physically her baseline -Discharged home with instructions to follow-up with PCP.  3-ischemic cardiomyopathy -Most recent echocardiogram demonstrated ejection fraction of 30% -Patient appears to have condition compensated at this time -Will resume home medication regimen -Advised to follow low-sodium diet and to check her weight on daily basis. -Continue outpatient follow-up with cardiology service.  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 -Patient scheduled to see pulmonologist for PFTs and formal diagnosis of her loans condition. -Will benefit of sleep study as an outpatient as given her body habitus most likely experiencing a big component of obstructive sleep apnea and obesity hypoventilation syndrome. -Continue oxygen supplementation -Currently no wheezing or complaining of shortness of  breath -Continue Xopenex, Atrovent and Pulmicort.  5-hypertension -Blood pressure stable  and well-controlled -Continue the use of metoprolol and losartan -Patient will also continue the use of Lasix daily. -Instructed to follow heart healthy diet  6-metastatic breast cancer -Continue follow-up with oncologist as an outpatient -Continue Femara  7-morbid obesity -Body mass index is 49.94 kg/m. -Discussion about importance of following low-calorie diet, portion control and increase physical activity provided.  Procedures:  See below for x-ray reports.  Consultations:  None  Discharge Exam: Vitals:   11/14/18 0803 11/14/18 0900  BP:  (!) 148/97  Pulse: 93 80  Resp: 20 (!) 27  Temp: (!) 97.3 F (36.3 C)   SpO2: (!) 88% 98%    General: Afebrile, denies lightheadedness, no chest pain, no nausea, no vomiting, no palpitations at this time.  Patient feeling good and asking to go home. Cardiovascular: S1 and S2, no rubs, no gallops, no murmurs. Respiratory: Good air movement bilaterally, no wheezing, no crackles. Abdomen: Obese, nondistended, positive bowel sounds, no tenderness on palpation. Extremities: Trace to 1+ edema bilaterally; no cyanosis, no clubbing     Discharge Instructions   Discharge Instructions    (HEART FAILURE PATIENTS) Call MD:  Anytime you have any of the following symptoms: 1) 3 pound weight gain in 24 hours or 5 pounds in 1 week 2) shortness of breath, with or without a dry hacking cough 3) swelling in the hands, feet or stomach 4) if you have to sleep on extra pillows at night in order to breathe.   Complete by:  As directed    Diet - low sodium heart healthy   Complete by:  As directed    Discharge instructions   Complete by:  As directed    Take medications as prescribed Keep yourself well-hydrated Follow low-sodium diet and check your weight on daily basis Arrange follow-up with PCP in 10 days Make sure to follow-up with pulmonology as  previously instructed.     Allergies as of 11/14/2018      Reactions   Brilinta [ticagrelor] Other (See Comments)   Nausea and severe diarrhea- general weakness. Patient says does not want to take again      Medication List    STOP taking these medications   albuterol 108 (90 Base) MCG/ACT inhaler Commonly known as:  PROVENTIL HFA;VENTOLIN HFA   ipratropium-albuterol 0.5-2.5 (3) MG/3ML Soln Commonly known as:  DUONEB     TAKE these medications   aspirin 81 MG chewable tablet Chew 1 tablet (81 mg total) by mouth daily. What changed:  when to take this   atorvastatin 80 MG tablet Commonly known as:  LIPITOR Take 80 mg by mouth every morning.   b complex vitamins tablet Take 1 tablet by mouth every morning.   budesonide 0.25 MG/2ML nebulizer solution Commonly known as:  PULMICORT Take 2 mLs (0.25 mg total) by nebulization 2 (two) times daily.   CITRACAL MAXIMUM 315-250 MG-UNIT Tabs Generic drug:  Calcium Citrate-Vitamin D Take 2 tablets by mouth 2 (two) times daily.   clopidogrel 75 MG tablet Commonly known as:  PLAVIX Take 1 tablet (75 mg total) by mouth daily. What changed:  when to take this   cyclobenzaprine 10 MG tablet Commonly known as:  FLEXERIL Take 10 mg by mouth 3 (three) times daily as needed for muscle spasms.   EQ VISION FORMULA 50+ PO Take 1 tablet by mouth every morning.   HAIR/SKIN/NAILS Caps Take 1 capsule by mouth every morning.   furosemide 40 MG tablet Commonly known as:  LASIX Take 1 tablet (40  mg total) by mouth daily. What changed:  when to take this   gabapentin 300 MG capsule Commonly known as:  NEURONTIN Take 300 mg by mouth every morning.   gabapentin 400 MG capsule Commonly known as:  NEURONTIN Take 800 mg by mouth at bedtime.   ipratropium 0.02 % nebulizer solution Commonly known as:  ATROVENT Take 2.5 mLs (0.5 mg total) by nebulization every 12 (twelve) hours.   letrozole 2.5 MG tablet Commonly known as:  FEMARA Take  2.5 mg by mouth at bedtime.   levalbuterol 0.63 MG/3ML nebulizer solution Commonly known as:  XOPENEX Take 3 mLs (0.63 mg total) by nebulization every 8 (eight) hours as needed for wheezing or shortness of breath.   levalbuterol 45 MCG/ACT inhaler Commonly known as:  XOPENEX HFA Inhale 2 puffs into the lungs every 8 (eight) hours as needed for wheezing or shortness of breath.   losartan 50 MG tablet Commonly known as:  COZAAR Take 2 tablets (100 mg total) by mouth every morning.   metoprolol succinate 25 MG 24 hr tablet Commonly known as:  TOPROL-XL Take 1 tablet (25 mg total) by mouth every evening. What changed:  how much to take   nitroGLYCERIN 0.4 MG SL tablet Commonly known as:  NITROSTAT Place 1 tablet (0.4 mg total) under the tongue every 5 (five) minutes as needed. What changed:  reasons to take this   Oxycodone HCl 10 MG Tabs Take 10 mg by mouth every 6 (six) hours as needed (for pain).   Potassium 99 MG Tabs Take 1 tablet by mouth every morning.   QUNOL COQ10/UBIQUINOL/MEGA 100 MG Caps Generic drug:  Ubiquinol Take 1 capsule by mouth every morning. QUNOL-Ultra COQ10      Allergies  Allergen Reactions  . Brilinta [Ticagrelor] Other (See Comments)    Nausea and severe diarrhea- general weakness. Patient says does not want to take again   Follow-up Information    Vyas, Dhruv B, MD. Schedule an appointment as soon as possible for a visit in 10 day(s).   Specialty:  Internal Medicine Contact information: [place] [address] [phone]           The results of significant diagnostics from this hospitalization (including imaging, microbiology, ancillary and laboratory) are listed below for reference.    Significant Diagnostic Studies: Ct Head Wo Contrast  Result Date: 11/13/2018 CLINICAL DATA:  Head trauma, minor, GCS>=13, high clinical risk, initial exam. Headache after fall yesterday striking head on night table after getting up too fast.  EXAM: CT HEAD WITHOUT CONTRAST TECHNIQUE: Contiguous axial images were obtained from the base of the skull through the vertex without intravenous contrast. COMPARISON:  None. FINDINGS: Brain: Tiny lacunar infarct in the right caudate. No intracranial hemorrhage, mass effect, or midline shift. No hydrocephalus. The basilar cisterns are patent. No evidence of territorial infarct or acute ischemia. No extra-axial or intracranial fluid collection. Vascular: Atherosclerosis of skullbase vasculature without hyperdense vessel or abnormal calcification. Skull: No fracture or focal lesion. Sinuses/Orbits: Paranasal sinuses and mastoid air cells are clear. The visualized orbits are unremarkable. Other: None. IMPRESSION: No acute intracranial abnormality. No skull fracture. Electronically Signed   By: Keith Rake M.D.   On: 11/13/2018 23:53    Microbiology: No results found for this or any previous visit (from the past 240 hour(s)).   Labs: Basic Metabolic Panel: Recent Labs  Lab 11/13/18 2358  NA 141  K 3.7  CL 104  CO2 29  GLUCOSE 117*  BUN 24*  CREATININE 0.79  CALCIUM 9.0  MG 1.9   Liver Function Tests: Recent Labs  Lab 11/13/18 2358  AST 27  ALT 31  ALKPHOS 57  BILITOT 1.7*  PROT 6.7  ALBUMIN 3.6   CBC: Recent Labs  Lab 11/13/18 2358  WBC 4.7  NEUTROABS 3.3  HGB 10.6*  HCT 35.3*  MCV 98.9  PLT 215   Cardiac Enzymes: Recent Labs  Lab 11/14/18 0630  TROPONINI <0.03   BNP: BNP (last 3 results) Recent Labs    11/30/17 2325 09/19/18 1204 10/06/18 2049  BNP 50.1 368.0* 258.0*    Signed:  Barton Dubois MD.  [facility] 11/14/2018, 10:03 AM

## 2018-11-14 NOTE — Progress Notes (Signed)
ANTICOAGULATION CONSULT NOTE - Preliminary  Pharmacy Consult for Heparin Indication: Atrial fibrillation  Allergies  Allergen Reactions  . Brilinta [Ticagrelor] Other (See Comments)    Nausea and severe diarrhea- general weakness. Patient says does not want to take again    Patient Measurements: Height: 5\' 1"  (154.9 cm) Weight: 253 lb (114.8 kg) IBW/kg (Calculated) : 47.8 HEPARIN DW (KG): 76.3   Vital Signs: Temp: 98.1 F (36.7 C) (12/14 2255) Temp Source: Oral (12/14 2255) BP: 118/93 (12/15 0400) Pulse Rate: 83 (12/15 0515)  Labs: Recent Labs    11/13/18 2358  HGB 10.6*  HCT 35.3*  PLT 215  CREATININE 0.79   Estimated Creatinine Clearance: 76 mL/min (by C-G formula based on SCr of 0.79 mg/dL).  Medical History: Past Medical History:  Diagnosis Date  . CAD in native artery    a.DES to ramus 2005 with late stent thrombosis 2006 tx with PTCA. 12/18 PCI/DES x1 to mRCA, EF 50-55%  . CHF (congestive heart failure) ([place])   . Chronic pain   . COPD (chronic obstructive pulmonary disease) ([place])   . Hyperlipidemia   . Hypertension   . LBBB (left bundle branch block)   . Left bundle branch block   . Lymphedema   . Metastatic breast cancer ([place])    a. to bone.  . MI (myocardial infarction) ([place])   . Mild aortic stenosis 10/2017  . Morbid obesity ([place])   . PSVT (paroxysmal supraventricular tachycardia) ([place])    a. per [facility] notes, seen on event monitor in 2014.  . Pulmonary nodules     Medications:   Assessment: 72 yo female with hx of CHF, ,CAD and s/p stents x3, seen in the ED with new onset Afib. Pharmacy has been consulted for IV heparin dosing.  Goal of Therapy:  Heparin level goal 0.3-0.7 units/ml Monitor platelets by anticoagulation protocol   Plan:  Heparin bolus 4000 units IV x 1  Heparin infusion at 1050 units/hr Heparin level in 6-8 hrs  Preliminary review of pertinent patient information completed.  Forestine Na clinical pharmacist will complete  review during morning rounds to assess the patient and finalize treatment regimen.  Norberto Sorenson, [place] 11/14/2018,5:30 AM

## 2018-11-14 NOTE — Progress Notes (Signed)
Discharge instruction given to patient and verbalized understanding. Taken to car via wheelchair.

## 2018-11-14 NOTE — ED Notes (Signed)
Pt to bedside commode with moderate assitance. HR elevated to 170s when moving to bedside commode. EDP aware and to bedside

## 2018-11-14 NOTE — ED Notes (Signed)
Pt hr would increase to 150's, repeat ekg performed and given to Dr Dayna Barker,

## 2018-11-14 NOTE — ED Notes (Signed)
Lab unable to add on INR/APTT. Notified to collect

## 2018-11-14 NOTE — ED Provider Notes (Signed)
Emergency Department Provider Note   I have reviewed the triage vital signs and the nursing notes.   HISTORY  Chief Complaint Fall   HPI Stacey [name] is a 72 y.o. female with multiple medical problems as documented below the presents to the emergency department today secondary to a fall.  Patient states that she was bending over sink to rinse her mouth out after breathing treatments she does not get thrush when she stood up too quickly she got lightheaded she reached and leaned backwards to sit on her rolling walker however it moved because the brakes were not on and she fell forward hitting the front of her head on the cabinet in front of her.  There was a few seconds where she thinks she may have lost consciousness that she does remember what happened however when she woke up she had a little bit of a headache in the frontal area.  She rolled over on her knees to help stand up which made her knees hurt but she did not fall on her knees.  Over the last few hours the bruising and contusion seems to be spreading up the top of her head which concerned her so she presents here for further evaluation.  On review of systems patient states that she has had worsening shortness of breath over the last few days specifically over the last few weeks.  She is seen a cardiologist, primary doctor and has been admitted to the hospital for the same thing without any answers except for COPD.  She has a appointment with pulmonology here in week or so.  This not any worse today.  She is asymptomatic otherwise at this time. No other associated or modifying symptoms.    Past Medical History:  Diagnosis Date  . CAD in native artery    a.DES to ramus 2005 with late stent thrombosis 2006 tx with PTCA. 12/18 PCI/DES x1 to mRCA, EF 50-55%  . CHF (congestive heart failure) ([place])   . Chronic pain   . COPD (chronic obstructive pulmonary disease) ([place])   . Hyperlipidemia   . Hypertension   . LBBB (left bundle  branch block)   . Left bundle branch block   . Lymphedema   . Metastatic breast cancer ([place])    a. to bone.  . MI (myocardial infarction) ([place])   . Mild aortic stenosis 10/2017  . Morbid obesity ([place])   . PSVT (paroxysmal supraventricular tachycardia) ([place])    a. per [facility] notes, seen on event monitor in 2014.  . Pulmonary nodules     Patient Active Problem List   Diagnosis Date Noted  . Elevated troponin 10/07/2018  . PSVT (paroxysmal supraventricular tachycardia) ([place]) 10/07/2018  . Hyperlipidemia 10/07/2018  . COPD (chronic obstructive pulmonary disease) ([place]) 10/07/2018  . CAD in native artery 10/07/2018  . Chronic pain 10/07/2018  . Acute back pain 10/07/2018  . Acute hypoxemic respiratory failure ([place]) 09/19/2018  . Acute on chronic combined systolic and diastolic CHF (congestive heart failure) ([place]) 09/19/2018  . COPD with acute exacerbation ([place]) 09/19/2018  . Dyspnea 11/30/2017  . Non-ST elevation (NSTEMI) myocardial infarction ([place])   . Hypertension 11/23/2017  . Left bundle branch block 11/23/2017  . Acute on chronic diastolic CHF (congestive heart failure) ([place]) 11/23/2017  . CAD (coronary artery disease) 11/23/2017  . Breast cancer ([place]) 11/23/2017  . Acute respiratory failure with hypoxia ([place]) 11/23/2017    Past Surgical History:  Procedure Laterality Date  . CORONARY STENT INTERVENTION N/A  11/26/2017   Procedure: CORONARY STENT INTERVENTION;  Surgeon: Martinique, Peter M, MD;  Location: [facility];  Service: Cardiovascular;  Laterality: N/A;  . CORONARY STENT PLACEMENT    . LEFT HEART CATH AND CORONARY ANGIOGRAPHY N/A 11/26/2017   Procedure: LEFT HEART CATH AND CORONARY ANGIOGRAPHY;  Surgeon: Martinique, Peter M, MD;  Location: [facility];  Service: Cardiovascular;  Laterality: N/A;    Current Outpatient Rx  . Order #: [id number] Class: Print  . Order #: [id number] Class: OTC  . Order #: [id number] Class: Historical Med  . Order #: [id number] Class:  Historical Med  . Order #: [id number] Class: Normal  . Order #: [id number] Class: Historical Med  . Order #: [id number] Class: Print  . Order #: [id number] Class: Historical Med  . Order #: [id number] Class: Print  . Order #: [id number] Class: Historical Med  . Order #: [id number] Class: Historical Med  . Order #: [id number] Class: Historical Med  . Order #: [id number] Class: Historical Med  . Order #: [id number] Class: Print  . Order #: [id number] Class: Historical Med  . Order #: [id number] Class: Historical Med  . Order #: [id number] Class: Historical Med  . Order #: [id number] Class: Print  . Order #: [id number] Class: Historical Med  . Order #: [id number] Class: Historical Med  . Order #: [id number] Class: Historical Med    Allergies Brilinta [ticagrelor]  Family History  Problem Relation Age of Onset  . Sudden Cardiac Death Neg Hx     Social History Social History   Tobacco Use  . Smoking status: Former Smoker    Packs/day: 2.00    Years: 18.00    Pack years: 36.00    Types: Cigarettes    Last attempt to quit: 12/01/1980    Years since quitting: 37.9  . Smokeless tobacco: Never Used  Substance Use Topics  . Alcohol use: No    Frequency: Never  . Drug use: No    Review of Systems  All other systems negative except as documented in the HPI. All pertinent positives and negatives as reviewed in the HPI. ____________________________________________   PHYSICAL EXAM:  VITAL SIGNS: ED Triage Vitals  Enc Vitals Group     BP 11/13/18 2255 (!) 157/92     Pulse Rate 11/13/18 2255 98     Resp 11/13/18 2255 (!) 22     Temp 11/13/18 2255 98.1 F (36.7 C)     Temp Source 11/13/18 2255 Oral     SpO2 11/13/18 2255 98 %     Weight 11/13/18 2251 253 lb (114.8 kg)     Height 11/13/18 2251 5\' 1"  (1.549 m)    Constitutional: Alert and oriented. Well appearing and in no acute distress. Eyes: Conjunctivae are normal. PERRL. EOMI. Head: ecchymosis just left of midline above eyebrows. Nose: No  congestion/rhinnorhea. Mouth/Throat: Mucous membranes are moist.  Oropharynx non-erythematous. Neck: No stridor.  No meningeal signs.   Cardiovascular: Normal rate, regular rhythm. Good peripheral circulation. Grossly normal heart sounds.   Respiratory: Normal respiratory effort.  No retractions. Lungs diminished significantly. Gastrointestinal: Soft and nontender. No distention.  Musculoskeletal: No lower extremity tenderness nor edema. No gross deformities of extremities. Neurologic:  Normal speech and language. No gross focal neurologic deficits are appreciated.  Skin:  Skin is warm, dry and intact. No rash noted.   ____________________________________________   LABS (all labs ordered are listed, but only abnormal results are displayed)  Labs Reviewed  CBC WITH DIFFERENTIAL/PLATELET - Abnormal; Notable for the following components:      Result Value   RBC 3.57 (*)  Hemoglobin 10.6 (*)    HCT 35.3 (*)    All other components within normal limits  COMPREHENSIVE METABOLIC PANEL - Abnormal; Notable for the following components:   Glucose, Bld 117 (*)    BUN 24 (*)    Total Bilirubin 1.7 (*)    All other components within normal limits  URINALYSIS, ROUTINE W REFLEX MICROSCOPIC  MAGNESIUM   ____________________________________________  EKG   EKG Interpretation  Date/Time:  Saturday November 13 2018 23:04:29 EST Ventricular Rate:  105 PR Interval:    QRS Duration: 142 QT Interval:  385 QTC Calculation: 509 R Axis:   -70 Text Interpretation:  Sinus tachycardia Left bundle branch block No significant change since last tracing Confirmed by Merrily Pew [phone]) on 11/13/2018 11:48:04 PM       EKG Interpretation  Date/Time:  Sunday November 14 2018 01:06:59 EST Ventricular Rate:  140 PR Interval:    QRS Duration: 143 QT Interval:  357 QTC Calculation: 545 R Axis:   -62 Text Interpretation:  atrial fibrillation vs sinus tachycardia Left atrial enlargement Left bundle  branch block faster rate than earlier, same morphology Confirmed by Merrily Pew [phone]) on 11/14/2018 2:55:05 AM       ____________________________________________  RADIOLOGY  Ct Head Wo Contrast  Result Date: 11/13/2018 CLINICAL DATA:  Head trauma, minor, GCS>=13, high clinical risk, initial exam. Headache after fall yesterday striking head on night table after getting up too fast. EXAM: CT HEAD WITHOUT CONTRAST TECHNIQUE: Contiguous axial images were obtained from the base of the skull through the vertex without intravenous contrast. COMPARISON:  None. FINDINGS: Brain: Tiny lacunar infarct in the right caudate. No intracranial hemorrhage, mass effect, or midline shift. No hydrocephalus. The basilar cisterns are patent. No evidence of territorial infarct or acute ischemia. No extra-axial or intracranial fluid collection. Vascular: Atherosclerosis of skullbase vasculature without hyperdense vessel or abnormal calcification. Skull: No fracture or focal lesion. Sinuses/Orbits: Paranasal sinuses and mastoid air cells are clear. The visualized orbits are unremarkable. Other: None. IMPRESSION: No acute intracranial abnormality. No skull fracture. Electronically Signed   By: Keith Rake M.D.   On: 11/13/2018 23:53    ____________________________________________   PROCEDURES  Procedure(s) performed:   Procedures  CRITICAL CARE Performed by: Merrily Pew Total critical care time: 35 minutes Critical care time was exclusive of separately billable procedures and treating other patients. Critical care was necessary to treat or prevent imminent or life-threatening deterioration. Critical care was time spent personally by me on the following activities: development of treatment plan with patient and/or surrogate as well as nursing, discussions with consultants, evaluation of patient's response to treatment, examination of patient, obtaining history from patient or surrogate, ordering and  performing treatments and interventions, ordering and review of laboratory studies, ordering and review of radiographic studies, pulse oximetry and re-evaluation of patient's condition.  ____________________________________________   INITIAL IMPRESSION / ASSESSMENT AND PLAN / ED COURSE  eval for traumatic injury. Labs to ensure no significant dehydration or other reason for her to be light headed/dizzy. Likely discharge if all relateively n ormal.  Also with diminished breath sounds. On multiple pulmonary meds at home. Will give duoneb here.   After duoneb, went into what appears AFib w/ RVR.  One dose of metoprolol given withotu improvement. High risk for ED sedation and cardioversion. Will try diltiazem and see if that works. If it does, can be discharged, if not or requires continueous diltiazem, will admit.   Improving heart rate but still A. fib with RVR  as high as 140 but not lower than 110.  Will increase infusion and give another bolus.  Hospitalist to admit.   Pertinent labs & imaging results that were available during my care of the patient were reviewed by me and considered in my medical decision making (see chart for details).  ____________________________________________  FINAL CLINICAL IMPRESSION(S) / ED DIAGNOSES  Final diagnoses:  Atrial fibrillation with rapid ventricular response ([place])  Fall, initial encounter  SOB (shortness of breath)     MEDICATIONS GIVEN DURING THIS VISIT:  Medications  diltiazem (CARDIZEM) 1 mg/mL load via infusion 15 mg (15 mg Intravenous Bolus from Bag 11/14/18 0307)    And  diltiazem (CARDIZEM) 100 mg in dextrose 5% 134mL (1 mg/mL) infusion (15 mg/hr Intravenous Rate/Dose Change 11/14/18 0419)  ipratropium-albuterol (DUONEB) 0.5-2.5 (3) MG/3ML nebulizer solution 3 mL (3 mLs Nebulization Given 11/13/18 2358)  metoprolol tartrate (LOPRESSOR) injection 5 mg (5 mg Intravenous Given 11/14/18 0142)  diltiazem (CARDIZEM) injection 20 mg (20 mg  Intravenous Given 11/14/18 0419)     NEW OUTPATIENT MEDICATIONS STARTED DURING THIS VISIT:  New Prescriptions   No medications on file    Note:  This note was prepared with assistance of Dragon voice recognition software. Occasional wrong-word or sound-a-like substitutions may have occurred due to the inherent limitations of voice recognition software.   Stacey [name], Corene Cornea, MD 11/14/18 [phone]

## 2018-11-14 NOTE — ED Notes (Signed)
Informed regarding urine sample

## 2018-11-14 NOTE — H&P (Signed)
TRH H&P    Patient Demographics:    Stacey [name], is a 72 y.o. female  MRN: [id number]  DOB - 1946/09/08  Admit Date - 11/13/2018  Referring MD/NP/PA: Dr. Dayna Barker  Outpatient Primary MD for the patient is Glenda Chroman, MD  Patient coming from: Home  Chief complaint-fall   HPI:    Stacey [name]  is a 72 y.o. 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  As per patient while she was bending over sink to rinse her mouth after breathing treatment she got up too quickly and felt lightheaded she leaned backwards to sitting on the walker however she lost balance and fell.  As per patient she may have lost consciousness for few seconds however when she woke up she had a headache in the frontal area. In the ED CT of the head showed no acute abnormality. Patient was given albuterol for dyspnea, she does have history of COPD Patient went into A. fib with RVR and started on IV Cardizem. Patient does not have any previous history of A. fib She denies chest pain but complains of chest discomfort due to palpitations. Denies nausea vomiting or diarrhea. Denies abdominal pain. No previous history of stroke or seizures   Review of systems:    In addition to the HPI above,    All other systems reviewed and are negative.    Past History of the following :    Past Medical History:  Diagnosis Date  . CAD in native artery    a.DES to ramus 2005 with late stent thrombosis 2006 tx with PTCA. 12/18 PCI/DES x1 to mRCA, EF 50-55%  . CHF (congestive heart failure) ([place])   . Chronic pain   . COPD (chronic obstructive pulmonary disease) ([place])   . Hyperlipidemia   . Hypertension   . LBBB (left bundle branch block)    . Left bundle branch block   . Lymphedema   . Metastatic breast cancer ([place])    a. to bone.  . MI (myocardial infarction) ([place])   . Mild aortic stenosis 10/2017  . Morbid obesity ([place])   . PSVT (paroxysmal supraventricular tachycardia) ([place])    a. per [facility] notes, seen on event monitor in 2014.  . Pulmonary nodules       Past Surgical History:  Procedure Laterality Date  . CORONARY STENT INTERVENTION N/A 11/26/2017   Procedure: CORONARY STENT INTERVENTION;  Surgeon: Martinique, Peter M, MD;  Location: [place];  Service: Cardiovascular;  Laterality: N/A;  . CORONARY STENT PLACEMENT    . LEFT HEART CATH AND CORONARY ANGIOGRAPHY N/A 11/26/2017   Procedure: LEFT HEART CATH AND CORONARY ANGIOGRAPHY;  Surgeon: Martinique, Peter M, MD;  Location: [facility] [place];  Service: Cardiovascular;  Laterality: N/A;      Social History:      Social History  Tobacco Use  . Smoking status: Former Smoker    Packs/day: 2.00    Years: 18.00    Pack years: 36.00    Types: Cigarettes    Last attempt to quit: 12/01/1980    Years since quitting: 37.9  . Smokeless tobacco: Never Used  Substance Use Topics  . Alcohol use: No    Frequency: Never       Family History :     Family History  Problem Relation Age of Onset  . Sudden Cardiac Death Neg Hx       Home Medications:   Prior to Admission medications   Medication Sig Start Date End Date Taking? Authorizing Provider  albuterol (PROVENTIL HFA;VENTOLIN HFA) 108 (90 Base) MCG/ACT inhaler Inhale 2 puffs into the lungs every 6 (six) hours as needed for wheezing or shortness of breath. 09/21/18   Isaac Bliss, Rayford Halsted, MD  aspirin 81 MG chewable tablet Chew 1 tablet (81 mg total) by mouth daily. Patient taking differently: Chew 81 mg by mouth every morning.  11/27/17   Cheryln Manly, NP  atorvastatin (LIPITOR) 80 MG tablet Take 80 mg by mouth every morning.     [provider]  b complex vitamins tablet Take 1  tablet by mouth every morning.     [provider]  budesonide (PULMICORT) 0.25 MG/2ML nebulizer solution Take 2 mLs (0.25 mg total) by nebulization 2 (two) times daily. 09/21/18 09/21/19  Erline Hau, MD  Calcium Citrate-Vitamin D (CITRACAL MAXIMUM) [phone] MG-UNIT TABS Take 2 tablets by mouth 2 (two) times daily.    [provider]  clopidogrel (PLAVIX) 75 MG tablet Take 1 tablet (75 mg total) by mouth daily. Patient taking differently: Take 75 mg by mouth every morning.  12/03/17   [facility], Tami Lin, PA  cyclobenzaprine (FLEXERIL) 10 MG tablet Take 10 mg by mouth 3 (three) times daily as needed for muscle spasms.    [provider]  furosemide (LASIX) 40 MG tablet Take 1 tablet (40 mg total) by mouth daily. Patient taking differently: Take 40 mg by mouth every morning.  09/21/18 09/21/19  Erline Hau, MD  gabapentin (NEURONTIN) 300 MG capsule Take 300 mg by mouth every morning.    [provider]  gabapentin (NEURONTIN) 400 MG capsule Take 800 mg by mouth at bedtime.    [provider]  ipratropium-albuterol (DUONEB) 0.5-2.5 (3) MG/3ML SOLN Take 3 mLs by nebulization 2 (two) times daily. *May do two additional treatments as needed for shortness of breath with Pulmicort    [provider]  letrozole (FEMARA) 2.5 MG tablet Take 2.5 mg by mouth at bedtime.     [provider]  losartan (COZAAR) 50 MG tablet Take 1 tablet (50 mg total) by mouth daily. Patient taking differently: Take 100 mg by mouth every morning.  12/03/17   [facility], Tami Lin, PA  metoprolol succinate (TOPROL-XL) 25 MG 24 hr tablet Take 0.5 tablets by mouth every evening.  03/08/18   [provider]  Multiple Vitamins-Minerals (EQ VISION FORMULA 50+ PO) Take 1 tablet by mouth every morning.    [provider]  Multiple Vitamins-Minerals (HAIR/SKIN/NAILS) CAPS Take 1 capsule by mouth every morning.    [provider]    nitroGLYCERIN (NITROSTAT) 0.4 MG SL tablet Place 1 tablet (0.4 mg total) under the tongue every 5 (five) minutes as needed. Patient taking differently: Place 0.4 mg under the tongue every 5 (five) minutes as needed for chest pain.  11/27/17   Cheryln Manly, NP  Oxycodone HCl 10 MG TABS Take 10 mg by mouth every 6 (six) hours as needed (for pain).    [provider]  Potassium 99 MG TABS Take 1 tablet by mouth every morning.    [provider]  Ubiquinol (QUNOL COQ10/UBIQUINOL/MEGA) 100 MG CAPS Take 1 capsule by mouth every morning. Mertie Moores    [provider]     Allergies:     Allergies  Allergen Reactions  . Brilinta [Ticagrelor] Other (See Comments)    Nausea and severe diarrhea- general weakness. Patient says does not want to take again     Physical Exam:   Vitals  Blood pressure (!) 118/93, pulse (!) 145, temperature 98.1 F (36.7 C), temperature source Oral, resp. rate (!) 22, height 5\' 1"  (1.549 m), weight 114.8 kg, SpO2 96 %.  1.  General: Appears in no acute distress  2. Psychiatric:  Intact judgement and  insight, awake alert, oriented x 3.  3. Neurologic: No focal neurological deficits, all cranial nerves intact.Strength 5/5 all 4 extremities, sensation intact all 4 extremities, plantars down going.  4. Eyes :  anicteric sclerae, moist conjunctivae with no lid lag. PERRLA.  5. ENMT:  Oropharynx clear with moist mucous membranes and good dentition  6. Neck:  supple, no cervical lymphadenopathy appriciated, No thyromegaly  7. Respiratory : Normal respiratory effort, good air movement bilaterally,clear to  auscultation bilaterally  8. Cardiovascular : Irregularly irregular rhythm, no murmurs auscultated , bilateral 1+ pitting edema of the lower extremities  9. Gastrointestinal:  Positive bowel sounds, abdomen soft, non-tender to palpation,no hepatosplenomegaly, no rigidity or guarding       10. Skin:  No cyanosis,  normal texture and turgor, no rash, lesions or ulcers  11.Musculoskeletal:  Good muscle tone,  joints appear normal , no effusions,  normal range of motion    Data Review:    CBC Recent Labs  Lab 11/13/18 2358  WBC 4.7  HGB 10.6*  HCT 35.3*  PLT 215  MCV 98.9  MCH 29.7  MCHC 30.0  RDW 14.6  LYMPHSABS 0.9  MONOABS 0.3  EOSABS 0.2  BASOSABS 0.0   ------------------------------------------------------------------------------------------------------------------  Results for orders placed or performed during the hospital encounter of 11/13/18 (from the past 48 hour(s))  CBC with Differential     Status: Abnormal   Collection Time: 11/13/18 11:58 PM  Result Value Ref Range   WBC 4.7 4.0 - 10.5 K/uL   RBC 3.57 (L) 3.87 - 5.11 MIL/uL   Hemoglobin 10.6 (L) 12.0 - 15.0 g/dL   HCT 35.3 (L) 36.0 - 46.0 %   MCV 98.9 80.0 - 100.0 fL   MCH 29.7 26.0 - 34.0 pg   MCHC 30.0 30.0 - 36.0 g/dL   RDW 14.6 11.5 - 15.5 %   Platelets 215 150 - 400 K/uL   nRBC 0.0 0.0 - 0.2 %   Neutrophils Relative % 71 %   Neutro Abs 3.3 1.7 - 7.7 K/uL   Lymphocytes Relative 19 %   Lymphs Abs 0.9 0.7 - 4.0 K/uL   Monocytes Relative 7 %   Monocytes Absolute 0.3 0.1 - 1.0 K/uL   Eosinophils Relative 3 %   Eosinophils Absolute 0.2 0.0 - 0.5 K/uL   Basophils Relative 0 %   Basophils Absolute 0.0 0.0 - 0.1 K/uL   Immature Granulocytes 0 %   Abs Immature Granulocytes 0.02 0.00 - 0.07 K/uL    Comment: Performed at [facility]  [facility], [address] [place], [place] [address]  Comprehensive metabolic panel     Status: Abnormal   Collection Time: 11/13/18 11:58 PM  Result Value Ref Range   Sodium 141 135 - 145 mmol/L   Potassium 3.7 3.5 - 5.1 mmol/L   Chloride 104 98 - 111 mmol/L   CO2 29 22 - 32 mmol/L   Glucose, Bld 117 (H) 70 - 99 mg/dL   BUN 24 (H) 8 - 23 mg/dL   Creatinine, Ser 0.79 0.44 - 1.00 mg/dL   Calcium 9.0 8.9 - 10.3 mg/dL   Total Protein 6.7 6.5 - 8.1 g/dL   Albumin 3.6 3.5 - 5.0 g/dL   AST  27 15 - 41 U/L   ALT 31 0 - 44 U/L   Alkaline Phosphatase 57 38 - 126 U/L   Total Bilirubin 1.7 (H) 0.3 - 1.2 mg/dL   GFR calc non Af Amer >60 >60 mL/min   GFR calc Af Amer >60 >60 mL/min   Anion gap 8 5 - 15    Comment: Performed at [facility], [address], [place], [place] [address]  Magnesium     Status: None   Collection Time: 11/13/18 11:58 PM  Result Value Ref Range   Magnesium 1.9 1.7 - 2.4 mg/dL    Comment: Performed at [facility], [address], [place], [place] [address]  Urinalysis, Routine w reflex microscopic     Status: None   Collection Time: 11/14/18  1:24 AM  Result Value Ref Range   Color, Urine YELLOW YELLOW   APPearance CLEAR CLEAR   Specific Gravity, Urine 1.017 1.005 - 1.030   pH 5.0 5.0 - 8.0   Glucose, UA NEGATIVE NEGATIVE mg/dL   Hgb urine dipstick NEGATIVE NEGATIVE   Bilirubin Urine NEGATIVE NEGATIVE   Ketones, ur NEGATIVE NEGATIVE mg/dL   Protein, ur NEGATIVE NEGATIVE mg/dL   Nitrite NEGATIVE NEGATIVE   Leukocytes, UA NEGATIVE NEGATIVE    Comment: Performed at [facility], [address], [place],  [address]    Chemistries  Recent Labs  Lab 11/13/18 2358  NA 141  K 3.7  CL 104  CO2 29  GLUCOSE 117*  BUN 24*  CREATININE 0.79  CALCIUM 9.0  MG 1.9  AST 27  ALT 31  ALKPHOS 57  BILITOT 1.7*   ------------------------------------------------------------------------------------------------------------------  ------------------------------------------------------------------------------------------------------------------ GFR: Estimated Creatinine Clearance: 76 mL/min (by C-G formula based on SCr of 0.79 mg/dL). Liver Function Tests: Recent Labs  Lab 11/13/18 2358  AST 27  ALT 31  ALKPHOS 57  BILITOT 1.7*  PROT 6.7  ALBUMIN 3.6    --------------------------------------------------------------------------------------------------------------- Urine analysis:    Component Value Date/Time   COLORURINE YELLOW 11/14/2018  0124   APPEARANCEUR CLEAR 11/14/2018 0124   LABSPEC 1.017 11/14/2018 0124   PHURINE 5.0 11/14/2018 0124   GLUCOSEU NEGATIVE 11/14/2018 0124   HGBUR NEGATIVE 11/14/2018 0124   BILIRUBINUR NEGATIVE 11/14/2018 0124   KETONESUR NEGATIVE 11/14/2018 0124   PROTEINUR NEGATIVE 11/14/2018 0124   NITRITE NEGATIVE 11/14/2018 0124   LEUKOCYTESUR NEGATIVE 11/14/2018 0124      Imaging Results:    Ct Head Wo Contrast  Result Date: 11/13/2018 CLINICAL DATA:  Head trauma, minor, GCS>=13, high clinical risk, initial exam. Headache after fall yesterday striking head on night table after getting up too fast. EXAM: CT HEAD WITHOUT CONTRAST TECHNIQUE: Contiguous axial images were obtained from the base of the skull through the vertex without intravenous contrast. COMPARISON:  None. FINDINGS: Brain: Tiny lacunar infarct in the  right caudate. No intracranial hemorrhage, mass effect, or midline shift. No hydrocephalus. The basilar cisterns are patent. No evidence of territorial infarct or acute ischemia. No extra-axial or intracranial fluid collection. Vascular: Atherosclerosis of skullbase vasculature without hyperdense vessel or abnormal calcification. Skull: No fracture or focal lesion. Sinuses/Orbits: Paranasal sinuses and mastoid air cells are clear. The visualized orbits are unremarkable. Other: None. IMPRESSION: No acute intracranial abnormality. No skull fracture. Electronically Signed   By: Keith Rake M.D.   On: 11/13/2018 23:53    My personal review of EKG: Rhythm atrial fibrillation with RVR   Assessment & Plan:    Active Problems:   Atrial fibrillation with RVR (HCC)   1. Atrial fibrillation with RVR-new onset, patient started on IV Cardizem gtt. 5 to 15 mg/h, will also restart patient's metoprolol succinate 25 mg p.o. daily.[id number] score is 4, patient started on IV heparin per pharmacy of the year he had a recent.  If rate not controlled consider cardiology consultation for possible  cardioversion.  Amiodarone will not be a good choice given history of COPD  2. Status post fall-CT head showed no acute abnormality  3. Ischemic cardiomyopathy-echocardiogram showed EF 30%, she appears euvolemic on exam.  Continue Lasix 40 mg p.o. daily.  4. COPD-does not appear to be in exacerbation, continue Xopenex, ipratropium every 6 hours as needed  5. Hypertension-blood pressure stable, continue metoprolol, started on IV Cardizem as above.  6. Metastatic breast cancer-followed by oncologist at [facility], continue Femara   DVT Prophylaxis-Heparin  AM Labs Ordered, also please review Full Orders  Family Communication: Admission, patients condition and plan of care including tests being ordered have been discussed with the patient  who indicate understanding and agree with the plan and Code Status.  Code Status: Full code Admission  status: Inpatient: Based on patients clinical presentation and evaluation of above clinical data, I have made determination that patient meets Inpatient criteria at this time.  Patient will need more than 2 midnights stay in the hospital.  She has new onset atrial fibrillation with RVR, started on IV Cardizem and IV heparin.  Might need cardiology consultation for possible cardioversion if she does not convert to normal sinus rhythm  Time spent in minutes : 60 minutes   Oswald Hillock M.D on 11/14/2018 at 5:08 AM  Between 7am to 7pm - Pager - [phone]. After 7pm go to [email or web address] - password [facility]   [facility] - Office  [phone]

## 2018-11-15 LAB — MRSA PCR SCREENING: MRSA by PCR: NEGATIVE

## 2018-11-30 ENCOUNTER — Encounter (HOSPITAL_COMMUNITY): Payer: Self-pay | Admitting: Emergency Medicine

## 2018-11-30 ENCOUNTER — Other Ambulatory Visit: Payer: Self-pay

## 2018-11-30 ENCOUNTER — Other Ambulatory Visit (HOSPITAL_COMMUNITY): Payer: Self-pay | Admitting: Respiratory Therapy

## 2018-11-30 ENCOUNTER — Emergency Department (HOSPITAL_COMMUNITY): Payer: Medicare Other

## 2018-11-30 ENCOUNTER — Inpatient Hospital Stay (HOSPITAL_COMMUNITY)
Admission: EM | Admit: 2018-11-30 | Discharge: 2018-12-02 | DRG: 308 | Disposition: A | Discharge status: Home / Self Care | Payer: Medicare Other | Source: Ambulatory Visit | Attending: Internal Medicine | Admitting: Internal Medicine

## 2018-11-30 DIAGNOSIS — C7951 Secondary malignant neoplasm of bone: Secondary | ICD-10-CM | POA: Diagnosis present

## 2018-11-30 DIAGNOSIS — G8929 Other chronic pain: Secondary | ICD-10-CM | POA: Diagnosis present

## 2018-11-30 DIAGNOSIS — I5043 Acute on chronic combined systolic (congestive) and diastolic (congestive) heart failure: Secondary | ICD-10-CM | POA: Diagnosis not present

## 2018-11-30 DIAGNOSIS — J449 Chronic obstructive pulmonary disease, unspecified: Secondary | ICD-10-CM | POA: Diagnosis present

## 2018-11-30 DIAGNOSIS — Z923 Personal history of irradiation: Secondary | ICD-10-CM

## 2018-11-30 DIAGNOSIS — Z8709 Personal history of other diseases of the respiratory system: Secondary | ICD-10-CM | POA: Diagnosis not present

## 2018-11-30 DIAGNOSIS — R0603 Acute respiratory distress: Secondary | ICD-10-CM | POA: Diagnosis present

## 2018-11-30 DIAGNOSIS — I110 Hypertensive heart disease with heart failure: Secondary | ICD-10-CM | POA: Diagnosis present

## 2018-11-30 DIAGNOSIS — E785 Hyperlipidemia, unspecified: Secondary | ICD-10-CM | POA: Diagnosis present

## 2018-11-30 DIAGNOSIS — Z7902 Long term (current) use of antithrombotics/antiplatelets: Secondary | ICD-10-CM

## 2018-11-30 DIAGNOSIS — J9611 Chronic respiratory failure with hypoxia: Secondary | ICD-10-CM | POA: Diagnosis present

## 2018-11-30 DIAGNOSIS — I340 Nonrheumatic mitral (valve) insufficiency: Secondary | ICD-10-CM | POA: Diagnosis not present

## 2018-11-30 DIAGNOSIS — Z7951 Long term (current) use of inhaled steroids: Secondary | ICD-10-CM

## 2018-11-30 DIAGNOSIS — I252 Old myocardial infarction: Secondary | ICD-10-CM

## 2018-11-30 DIAGNOSIS — I2510 Atherosclerotic heart disease of native coronary artery without angina pectoris: Secondary | ICD-10-CM | POA: Diagnosis present

## 2018-11-30 DIAGNOSIS — E662 Morbid (severe) obesity with alveolar hypoventilation: Secondary | ICD-10-CM | POA: Diagnosis present

## 2018-11-30 DIAGNOSIS — Z79891 Long term (current) use of opiate analgesic: Secondary | ICD-10-CM | POA: Diagnosis not present

## 2018-11-30 DIAGNOSIS — Z79899 Other long term (current) drug therapy: Secondary | ICD-10-CM

## 2018-11-30 DIAGNOSIS — Z888 Allergy status to other drugs, medicaments and biological substances status: Secondary | ICD-10-CM | POA: Diagnosis not present

## 2018-11-30 DIAGNOSIS — Z87891 Personal history of nicotine dependence: Secondary | ICD-10-CM | POA: Diagnosis not present

## 2018-11-30 DIAGNOSIS — I255 Ischemic cardiomyopathy: Secondary | ICD-10-CM | POA: Diagnosis present

## 2018-11-30 DIAGNOSIS — I5023 Acute on chronic systolic (congestive) heart failure: Secondary | ICD-10-CM | POA: Diagnosis present

## 2018-11-30 DIAGNOSIS — J441 Chronic obstructive pulmonary disease with (acute) exacerbation: Secondary | ICD-10-CM

## 2018-11-30 DIAGNOSIS — Z6841 Body Mass Index (BMI) 40.0 and over, adult: Secondary | ICD-10-CM

## 2018-11-30 DIAGNOSIS — Z79811 Long term (current) use of aromatase inhibitors: Secondary | ICD-10-CM | POA: Diagnosis not present

## 2018-11-30 DIAGNOSIS — Z955 Presence of coronary angioplasty implant and graft: Secondary | ICD-10-CM

## 2018-11-30 DIAGNOSIS — C50919 Malignant neoplasm of unspecified site of unspecified female breast: Secondary | ICD-10-CM | POA: Diagnosis present

## 2018-11-30 DIAGNOSIS — Z170 Estrogen receptor positive status [ER+]: Secondary | ICD-10-CM

## 2018-11-30 DIAGNOSIS — Z7982 Long term (current) use of aspirin: Secondary | ICD-10-CM | POA: Diagnosis not present

## 2018-11-30 DIAGNOSIS — I4891 Unspecified atrial fibrillation: Principal | ICD-10-CM | POA: Diagnosis present

## 2018-11-30 DIAGNOSIS — I447 Left bundle-branch block, unspecified: Secondary | ICD-10-CM | POA: Diagnosis present

## 2018-11-30 DIAGNOSIS — I361 Nonrheumatic tricuspid (valve) insufficiency: Secondary | ICD-10-CM | POA: Diagnosis not present

## 2018-11-30 LAB — CBC
HCT: 40.7 % (ref 36.0–46.0)
HEMOGLOBIN: 12.3 g/dL (ref 12.0–15.0)
MCH: 30.8 pg (ref 26.0–34.0)
MCHC: 30.2 g/dL (ref 30.0–36.0)
MCV: 101.8 fL — ABNORMAL HIGH (ref 80.0–100.0)
Platelets: 186 10*3/uL (ref 150–400)
RBC: 4 MIL/uL (ref 3.87–5.11)
RDW: 14.9 % (ref 11.5–15.5)
WBC: 8.5 10*3/uL (ref 4.0–10.5)
nRBC: 0 % (ref 0.0–0.2)

## 2018-11-30 LAB — BASIC METABOLIC PANEL
Anion gap: 11 (ref 5–15)
BUN: 15 mg/dL (ref 8–23)
CO2: 24 mmol/L (ref 22–32)
Calcium: 8.9 mg/dL (ref 8.9–10.3)
Chloride: 108 mmol/L (ref 98–111)
Creatinine, Ser: 0.79 mg/dL (ref 0.44–1.00)
GFR calc Af Amer: >60 mL/min (ref >60)
GFR calc non Af Amer: >60 mL/min (ref >60)
Glucose, Bld: 194 mg/dL — ABNORMAL HIGH (ref 70–99)
Potassium: 4.1 mmol/L (ref 3.5–5.1)
Sodium: 143 mmol/L (ref 135–145)

## 2018-11-30 LAB — TROPONIN I: Troponin I: <0.03 ng/mL (ref <0.03)

## 2018-11-30 LAB — BRAIN NATRIURETIC PEPTIDE: B Natriuretic Peptide: 552.6 pg/mL — ABNORMAL HIGH (ref 0.0–100.0)

## 2018-11-30 MED ORDER — CLOPIDOGREL BISULFATE 75 MG PO TABS
75.0000 mg | ORAL_TABLET | Freq: Every day | ORAL | Status: DC
  Administered 2018-12-01 – 2018-12-02 (×2): 75 mg via ORAL
  Filled 2018-11-30 (×2): qty 1

## 2018-11-30 MED ORDER — ARFORMOTEROL TARTRATE 15 MCG/2ML IN NEBU: 15.0000 ug | INHALATION_SOLUTION | Freq: Two times a day (BID) | RESPIRATORY_TRACT | Status: DC

## 2018-11-30 MED ORDER — LOSARTAN POTASSIUM 50 MG PO TABS
100.0000 mg | ORAL_TABLET | Freq: Every morning | ORAL | Status: DC
  Administered 2018-12-01 – 2018-12-02 (×2): 100 mg via ORAL
  Filled 2018-11-30 (×2): qty 2

## 2018-11-30 MED ORDER — GABAPENTIN 300 MG PO CAPS
300.0000 mg | ORAL_CAPSULE | Freq: Every morning | ORAL | Status: DC
  Administered 2018-12-01 – 2018-12-02 (×2): 300 mg via ORAL
  Filled 2018-11-30 (×2): qty 1

## 2018-11-30 MED ORDER — IPRATROPIUM BROMIDE 0.02 % IN SOLN
0.5000 mg | Freq: Four times a day (QID) | RESPIRATORY_TRACT | Status: DC
  Administered 2018-12-01 (×3): 0.5 mg via RESPIRATORY_TRACT
  Filled 2018-11-30 (×4): qty 2.5

## 2018-11-30 MED ORDER — ACETAMINOPHEN 325 MG PO TABS
650.0000 mg | ORAL_TABLET | ORAL | Status: DC | PRN
  Filled 2018-11-30: qty 2

## 2018-11-30 MED ORDER — DILTIAZEM LOAD VIA INFUSION
20.0000 mg | Freq: Once | INTRAVENOUS | Status: AC
  Administered 2018-11-30: 20 mg via INTRAVENOUS
  Filled 2018-11-30: qty 20

## 2018-11-30 MED ORDER — ATORVASTATIN CALCIUM 80 MG PO TABS
80.0000 mg | ORAL_TABLET | Freq: Every morning | ORAL | Status: DC
  Administered 2018-12-01 – 2018-12-02 (×2): 80 mg via ORAL
  Filled 2018-11-30 (×2): qty 1

## 2018-11-30 MED ORDER — ONDANSETRON HCL 4 MG/2ML IJ SOLN: 4.0000 mg | Freq: Four times a day (QID) | INTRAMUSCULAR | Status: DC | PRN

## 2018-11-30 MED ORDER — IPRATROPIUM BROMIDE 0.02 % IN SOLN
0.5000 mg | Freq: Once | RESPIRATORY_TRACT | Status: AC
  Administered 2018-11-30: 0.5 mg via RESPIRATORY_TRACT
  Filled 2018-11-30: qty 2.5

## 2018-11-30 MED ORDER — SODIUM CHLORIDE 0.9% FLUSH: 3.0000 mL | INTRAVENOUS | Status: DC | PRN

## 2018-11-30 MED ORDER — LEVALBUTEROL HCL 0.63 MG/3ML IN NEBU: 0.6300 mg | INHALATION_SOLUTION | Freq: Three times a day (TID) | RESPIRATORY_TRACT | Status: DC | PRN

## 2018-11-30 MED ORDER — DILTIAZEM HCL-DEXTROSE 100-5 MG/100ML-% IV SOLN (PREMIX)
5.0000 mg/h | INTRAVENOUS | Status: AC
  Administered 2018-11-30 – 2018-12-01 (×2): 5 mg/h via INTRAVENOUS
  Filled 2018-11-30 (×2): qty 100

## 2018-11-30 MED ORDER — BUDESONIDE 0.25 MG/2ML IN SUSP: 0.2500 mg | Freq: Two times a day (BID) | RESPIRATORY_TRACT | Status: DC

## 2018-11-30 MED ORDER — METOPROLOL TARTRATE 25 MG PO TABS
25.0000 mg | ORAL_TABLET | Freq: Four times a day (QID) | ORAL | Status: DC
  Administered 2018-12-01 – 2018-12-02 (×5): 25 mg via ORAL
  Filled 2018-11-30 (×5): qty 1

## 2018-11-30 MED ORDER — ASPIRIN 81 MG PO CHEW
81.0000 mg | CHEWABLE_TABLET | Freq: Every day | ORAL | Status: DC
  Administered 2018-12-01 – 2018-12-02 (×2): 81 mg via ORAL
  Filled 2018-11-30 (×2): qty 1

## 2018-11-30 MED ORDER — FUROSEMIDE 10 MG/ML IJ SOLN
80.0000 mg | Freq: Once | INTRAMUSCULAR | Status: AC
  Administered 2018-12-01: 80 mg via INTRAVENOUS
  Filled 2018-11-30: qty 8

## 2018-11-30 MED ORDER — FUROSEMIDE 10 MG/ML IJ SOLN: 40.0000 mg | Freq: Once | INTRAMUSCULAR | Status: DC

## 2018-11-30 MED ORDER — OXYCODONE HCL 5 MG PO TABS: 10.0000 mg | ORAL_TABLET | Freq: Four times a day (QID) | ORAL | Status: DC | PRN

## 2018-11-30 MED ORDER — ENOXAPARIN SODIUM 120 MG/0.8ML ~~LOC~~ SOLN
1.0000 mg/kg | Freq: Two times a day (BID) | SUBCUTANEOUS | Status: DC
  Administered 2018-12-01 – 2018-12-02 (×4): 115 mg via SUBCUTANEOUS
  Filled 2018-11-30 (×4): qty 0.77

## 2018-11-30 MED ORDER — ALBUTEROL SULFATE (2.5 MG/3ML) 0.083% IN NEBU
5.0000 mg | INHALATION_SOLUTION | Freq: Once | RESPIRATORY_TRACT | Status: DC
  Filled 2018-11-30: qty 6

## 2018-11-30 MED ORDER — GABAPENTIN 400 MG PO CAPS
800.0000 mg | ORAL_CAPSULE | Freq: Every day | ORAL | Status: DC
  Administered 2018-12-01 (×2): 800 mg via ORAL
  Filled 2018-11-30 (×2): qty 2

## 2018-11-30 MED ORDER — SODIUM CHLORIDE 0.9% FLUSH
3.0000 mL | Freq: Two times a day (BID) | INTRAVENOUS | Status: DC
  Administered 2018-12-01 – 2018-12-02 (×4): 3 mL via INTRAVENOUS

## 2018-11-30 MED ORDER — LETROZOLE 2.5 MG PO TABS
2.5000 mg | ORAL_TABLET | Freq: Every day | ORAL | Status: DC
  Administered 2018-12-01 (×2): 2.5 mg via ORAL
  Filled 2018-11-30 (×3): qty 1

## 2018-11-30 MED ORDER — LEVALBUTEROL HCL 0.63 MG/3ML IN NEBU
0.6300 mg | INHALATION_SOLUTION | Freq: Once | RESPIRATORY_TRACT | Status: AC
  Administered 2018-11-30: 0.63 mg via RESPIRATORY_TRACT
  Filled 2018-11-30: qty 3

## 2018-11-30 MED ORDER — SODIUM CHLORIDE 0.9 % IV SOLN: 250.0000 mL | INTRAVENOUS | Status: DC | PRN

## 2018-11-30 MED ORDER — FUROSEMIDE 10 MG/ML IJ SOLN
60.0000 mg | Freq: Once | INTRAMUSCULAR | Status: AC
  Administered 2018-11-30: 60 mg via INTRAVENOUS
  Filled 2018-11-30: qty 6

## 2018-11-30 MED ORDER — NITROGLYCERIN 2 % TD OINT
1.0000 [in_us] | TOPICAL_OINTMENT | Freq: Once | TRANSDERMAL | Status: DC
  Filled 2018-11-30: qty 30

## 2018-11-30 NOTE — H&P (Signed)
History and Physical    Stacey [name] QIH:[id number] DOB: [birth year] DOA: 11/30/2018  PCP: Glenda Chroman, MD Patient coming from: Radiation Oncology clinic at [facility]  I have personally briefly reviewed patient's old medical records in [place]  Chief Complaint: Shortness of breath  HPI: Stacey [name] is a 72 y.o. 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  She reports that she went to [facility] in the morning for her radiation treatment.  At that time she was already feeling somewhat short of breath.  After completing her treatment and on the way back home, her shortness of breath worsened, so she presented to the ED for further work-up and management.  She reports adherence with her home heart failure regimen of Toprol, losartan and Lasix, however she does note that her Lasix has been working suboptimally in recent weeks.  She has also noted increased lower extremity edema and orthopnea along with the aforementioned exertional dyspnea.  She denies change in her diet, increase sodium intake or increased fluid intake recently.  She endorses nonproductive cough, denies fever, chills, chest pain, abdominal pain, nausea, vomiting, urinary changes.  No sick contacts.  ED Course: In the ED, patient afebrile, tachycardic into the upper 150s in atrial fibrillation with rapid ventricular response, normotensive and requiring 2 L nasal cannula to maintain appropriate oxygen saturation.  Labs notable for BNP 553 (was previously 258), troponin less than 0.03, EKG showing A. fib with rapid ventricular rate at 151 with left bundle branch block that is been present on prior EKGs.  Chest x-ray showed cardiomegaly with vascular congestion  and interstitial edema with left basilar atelectasis.  She was initially placed on BiPAP in the ED which she did not tolerate well.  She was diuresed with Lasix and started on Cardizem drip for rate control.  Atrovent and Xopenex were also administered.  Review of Systems: As per HPI otherwise 10 point review of systems negative.   Past Medical History:  Diagnosis Date  . CAD in native artery    a.DES to ramus 2005 with late stent thrombosis 2006 tx with PTCA. 12/18 PCI/DES x1 to mRCA, EF 50-55%  . CHF (congestive heart failure) ([place])   . Chronic pain   . COPD (chronic obstructive pulmonary disease) ([place])   . Hyperlipidemia   . Hypertension   . LBBB (left bundle branch block)   . Left bundle branch block   . Lymphedema   . Metastatic breast cancer ([place])    a. to bone.  . MI (myocardial infarction) ([place])   . Mild aortic stenosis 10/2017  . Morbid obesity ([place])   . PSVT (paroxysmal supraventricular tachycardia) ([place])    a. per [facility] notes, seen on event monitor in 2014.  . Pulmonary nodules     Past Surgical History:  Procedure Laterality Date  . CORONARY STENT INTERVENTION N/A 11/26/2017   Procedure: CORONARY STENT INTERVENTION;  Surgeon: Martinique, Peter M, MD;  Location: [place] [facility];  Service: Cardiovascular;  Laterality: N/A;  . CORONARY STENT PLACEMENT    . LEFT HEART CATH AND CORONARY ANGIOGRAPHY N/A 11/26/2017   Procedure: LEFT HEART CATH AND CORONARY ANGIOGRAPHY;  Surgeon: Martinique, Peter M, MD;  Location: [place] [facility];  Service: Cardiovascular;  Laterality:  N/A;     reports that she quit smoking about 38 years ago. Her smoking use included cigarettes. She has a 36.00 pack-year smoking history. She has never used smokeless tobacco. She reports that she does not drink alcohol or use drugs.  Allergies  Allergen Reactions  . Brilinta [Ticagrelor] Other (See Comments)    Nausea and severe diarrhea- general weakness. Patient says does not want to take again  .  Albuterol Other (See Comments)    Heart racing     Family History  Problem Relation Age of Onset  . Sudden Cardiac Death Neg Hx     Prior to Admission medications   Medication Sig Start Date End Date Taking? Authorizing Provider  aspirin 81 MG chewable tablet Chew 1 tablet (81 mg total) by mouth daily. Patient taking differently: Chew 81 mg by mouth every morning.  11/27/17  Yes Cheryln Manly, NP  atorvastatin (LIPITOR) 80 MG tablet Take 80 mg by mouth every morning.    Yes [provider]  b complex vitamins tablet Take 1 tablet by mouth every morning.    Yes [provider]  budesonide (PULMICORT) 0.25 MG/2ML nebulizer solution Take 2 mLs (0.25 mg total) by nebulization 2 (two) times daily. 09/21/18 09/21/19 Yes Erline Hau, MD  Calcium Citrate-Vitamin D (CITRACAL MAXIMUM) 315-250 MG-UNIT TABS Take 2 tablets by mouth 2 (two) times daily.   Yes [provider]  clopidogrel (PLAVIX) 75 MG tablet Take 1 tablet (75 mg total) by mouth daily. Patient taking differently: Take 75 mg by mouth every morning.  12/03/17  Yes [facility], Tami Lin, PA  cyclobenzaprine (FLEXERIL) 10 MG tablet Take 10 mg by mouth 3 (three) times daily as needed for muscle spasms.   Yes [provider]  furosemide (LASIX) 40 MG tablet Take 1 tablet (40 mg total) by mouth daily. Patient taking differently: Take 40 mg by mouth every morning.  09/21/18 09/21/19 Yes Erline Hau, MD  gabapentin (NEURONTIN) 300 MG capsule Take 300 mg by mouth every morning.   Yes [provider]  gabapentin (NEURONTIN) 400 MG capsule Take 800 mg by mouth at bedtime.   Yes [provider]  letrozole (FEMARA) 2.5 MG tablet Take 2.5 mg by mouth at bedtime.    Yes [provider]  levalbuterol (XOPENEX) 0.63 MG/3ML nebulizer solution Take 3 mLs (0.63 mg total) by nebulization every 8 (eight) hours as needed for wheezing or shortness of breath. 11/14/18   Yes Barton Dubois, MD  losartan (COZAAR) 50 MG tablet Take 2 tablets (100 mg total) by mouth every morning. 11/14/18  Yes Barton Dubois, MD  metoprolol succinate (TOPROL-XL) 25 MG 24 hr tablet Take 1 tablet (25 mg total) by mouth every evening. 11/14/18  Yes Barton Dubois, MD  Multiple Vitamins-Minerals (EQ VISION FORMULA 50+ PO) Take 1 tablet by mouth every morning.   Yes [provider]  Multiple Vitamins-Minerals (HAIR/SKIN/NAILS) CAPS Take 1 capsule by mouth every morning.   Yes [provider]  nitroGLYCERIN (NITROSTAT) 0.4 MG SL tablet Place 1 tablet (0.4 mg total) under the tongue every 5 (five) minutes as needed. Patient taking differently: Place 0.4 mg under the tongue every 5 (five) minutes as needed for chest pain.  11/27/17  Yes Reino Bellis B, NP  Oxycodone HCl 10 MG TABS Take 10 mg by mouth every 6 (six) hours as needed (for pain).   Yes [provider]  Potassium 99 MG TABS Take 1 tablet by mouth every  morning.   Yes [provider]  Ubiquinol (QUNOL COQ10/UBIQUINOL/MEGA) 100 MG CAPS Take 1 capsule by mouth every morning. QUNOL-Ultra COQ10   Yes [provider]  ipratropium (ATROVENT) 0.02 % nebulizer solution Take 2.5 mLs (0.5 mg total) by nebulization every 12 (twelve) hours. Patient not taking: Reported on 11/30/2018 11/14/18   Barton Dubois, MD  levalbuterol [facility] HFA) 45 MCG/ACT inhaler Inhale 2 puffs into the lungs every 8 (eight) hours as needed for wheezing or shortness of breath. 11/14/18 11/14/19  Barton Dubois, MD    Physical Exam: Vitals:   11/30/18 2145 11/30/18 2200 11/30/18 2215 11/30/18 2230  BP: 135/65 137/69 140/76 130/67  Pulse: 82 81 85 84  Resp: (!) 21 (!) 22 (!) 21 15  Temp:      TempSrc:      SpO2: 98% 100% 98% 97%    Constitutional: NAD, calm, comfortable Eyes: PERRL, lids and conjunctivae normal ENMT: Mucous membranes are moist. Posterior pharynx clear of any exudate or lesions. Neck: normal,  supple, no masses Respiratory: bibasilar crackles. Normal respiratory effort. No accessory muscle use.  Cardiovascular: Regular rate and rhythm, 2/6 holosystolic murmur at apex. 2+ pitting edema of BLE. 2+ pedal pulses. Abdomen: no tenderness, no masses palpated. Bowel sounds positive.  Musculoskeletal: no clubbing / cyanosis. No joint deformity upper and lower extremities. Good ROM, no contractures. Normal muscle tone.  Skin: no rashes, lesions, ulcers. No induration Neurologic: CN 2-12 grossly intact. Sensation intact, DTR normal. Strength 5/5 in all 4.  Psychiatric: Normal judgment and insight. Alert and oriented x 3. Normal mood.    Labs on Admission: I have personally reviewed following labs and imaging studies  CBC: Recent Labs  Lab 11/30/18 1857  WBC 8.5  HGB 12.3  HCT 40.7  MCV 101.8*  PLT 280   Basic Metabolic Panel: Recent Labs  Lab 11/30/18 1857  NA 143  K 4.1  CL 108  CO2 24  GLUCOSE 194*  BUN 15  CREATININE 0.79  CALCIUM 8.9   GFR: CrCl cannot be calculated (Unknown ideal weight.). Liver Function Tests: No results for input(s): AST, ALT, ALKPHOS, BILITOT, PROT, ALBUMIN in the last 168 hours. No results for input(s): LIPASE, AMYLASE in the last 168 hours. No results for input(s): AMMONIA in the last 168 hours. Coagulation Profile: No results for input(s): INR, PROTIME in the last 168 hours. Cardiac Enzymes: Recent Labs  Lab 11/30/18 1940  TROPONINI <0.03   BNP (last 3 results) No results for input(s): PROBNP in the last 8760 hours. HbA1C: No results for input(s): HGBA1C in the last 72 hours. CBG: No results for input(s): GLUCAP in the last 168 hours. Lipid Profile: No results for input(s): CHOL, HDL, LDLCALC, TRIG, CHOLHDL, LDLDIRECT in the last 72 hours. Thyroid Function Tests: No results for input(s): TSH, T4TOTAL, FREET4, T3FREE, THYROIDAB in the last 72 hours. Anemia Panel: No results for input(s): VITAMINB12, FOLATE, FERRITIN, TIBC, IRON,  RETICCTPCT in the last 72 hours. Urine analysis:    Component Value Date/Time   COLORURINE YELLOW 11/14/2018 0124   APPEARANCEUR CLEAR 11/14/2018 0124   LABSPEC 1.017 11/14/2018 0124   PHURINE 5.0 11/14/2018 0124   GLUCOSEU NEGATIVE 11/14/2018 0124   HGBUR NEGATIVE 11/14/2018 0124   BILIRUBINUR NEGATIVE 11/14/2018 0124   KETONESUR NEGATIVE 11/14/2018 0124   PROTEINUR NEGATIVE 11/14/2018 0124   NITRITE NEGATIVE 11/14/2018 0124   LEUKOCYTESUR NEGATIVE 11/14/2018 0124    Radiological Exams on Admission: Dg Chest Port 1 View  Result Date: 11/30/2018 CLINICAL DATA:  Shortness of breath EXAM: PORTABLE CHEST 1 VIEW COMPARISON:  10/06/2018 FINDINGS: Small pleural effusions. Cardiomegaly with vascular congestion and mild diffuse interstitial edema. Patchy atelectasis or infiltrate at the left base. Aortic atherosclerosis. No pneumothorax. IMPRESSION: 1. Cardiomegaly with vascular congestion and mild interstitial pulmonary edema and small pleural effusions 2. Patchy atelectasis or infiltrate at the left base Electronically Signed   By: Donavan Foil M.D.   On: 11/30/2018 19:23    EKG: Independently reviewed. Afib with RVR. Rate 151. LBBB.  Assessment/Plan Active Problems:   Atrial fibrillation with rapid ventricular response ([place])  1. Atrial fibrillation with rapid ventricular response Afib likely precipitated by heart failure exacerbation. - Continue Cardizem gtt, titrate for HR <110 - Bridge to Lopressor Q6H when rate controlled - Start Lovenox 1 mg/kg Q12H as it is unclear when Afib began - Obtain TTE - Trend troponin - Goal K>4, Mg>2 - Check thyroid labs - Diurese  2. HFrEF exacerbation 3. CAD/MI BNP elevated above prior values with lower extremity edema, SOB and CXR findings consistent with volume overload. - Diurese with IV Lasix, goal 1L negative overnight - Obtain TTE as above - Continue home ASA, Plavix, statin - Switch Toprol to Lopressor for rate control - Continue  home Losartan  4. ER/PR positive breast cancer with bony metastases - Continue letrozole per home - Holding radiation therapy while inpatient - Pain control, nausea control PRN  5. Undocumented COPD Presentation not consistent with COPD exacerbation. No wheezing on exam and no increase in sputum production. At baseline she uses 1L of supplemental oxygen PRN. - [place], Pulmicort nebs BID - Atrovent nebs Q6H - Xopenex PRN  DVT prophylaxis: Lovenox 1 mg/kg Code Status: Full Disposition Plan: Home in 2-3 days Consults called: None Admission status: SDU   Bennie Pierini MD [facility]  If 7PM-7AM, please contact night-coverage [email or web address] Password [email or web address]  11/30/2018, 11:26 PM

## 2018-11-30 NOTE — ED Provider Notes (Addendum)
[facility] EMERGENCY DEPARTMENT Provider Note   CSN: [id number] Arrival date & time: 11/30/18  4098     History   Chief Complaint Chief Complaint  Patient presents with  . Shortness of Breath    HPI Stacey [name] is a 72 y.o. female.  Patient w hx cm, chf, copd, presents with progressive sob since this AM. Symptoms constant, moderate-severe, worse this afternoon. Indicates went to [facility] for radiation tx, but was feeling sob all day, and did not want to miss appointment. Did not take her meds today, including her diuretic. Hx svt/afib, and heart rate noted in 150-160 range by EMS. Pt denies chest pain, but states felt tight in chest. Denies cough or uri symptoms. No fever or chills. Has noted bil leg swelling and orthopnea for past couple of days.   The history is provided by the patient.  Shortness of Breath  Associated symptoms include leg swelling. Pertinent negatives include no fever, no headaches, no sore throat, no neck pain, no vomiting, no abdominal pain and no rash.    Past Medical History:  Diagnosis Date  . CAD in native artery    a.DES to ramus 2005 with late stent thrombosis 2006 tx with PTCA. 12/18 PCI/DES x1 to mRCA, EF 50-55%  . CHF (congestive heart failure) ([place])   . Chronic pain   . COPD (chronic obstructive pulmonary disease) ([place])   . Hyperlipidemia   . Hypertension   . LBBB (left bundle branch block)   . Left bundle branch block   . Lymphedema   . Metastatic breast cancer ([place])    a. to bone.  . MI (myocardial infarction) ([place])   . Mild aortic stenosis 10/2017  . Morbid obesity ([place])   . PSVT (paroxysmal supraventricular tachycardia) ([place])    a. per [facility] notes, seen on event monitor in 2014.  . Pulmonary nodules     Patient Active Problem List   Diagnosis Date Noted  . Atrial fibrillation with rapid ventricular response ([place]) 11/14/2018  . Fall   . Obesity, Class III, BMI 40-49.9 (morbid obesity) ([place])   . Chronic  respiratory failure with hypoxia ([place])   . Elevated troponin 10/07/2018  . PSVT (paroxysmal supraventricular tachycardia) ([place]) 10/07/2018  . Hyperlipidemia 10/07/2018  . COPD (chronic obstructive pulmonary disease) ([place]) 10/07/2018  . CAD in native artery 10/07/2018  . Chronic pain 10/07/2018  . Acute back pain 10/07/2018  . Acute hypoxemic respiratory failure ([place]) 09/19/2018  . Acute on chronic combined systolic and diastolic CHF (congestive heart failure) ([place]) 09/19/2018  . COPD with acute exacerbation ([place]) 09/19/2018  . Dyspnea 11/30/2017  . Non-ST elevation (NSTEMI) myocardial infarction ([place])   . Hypertension 11/23/2017  . Left bundle branch block 11/23/2017  . Acute on chronic diastolic CHF (congestive heart failure) ([place]) 11/23/2017  . CAD (coronary artery disease) 11/23/2017  . Breast cancer ([place]) 11/23/2017  . Acute respiratory failure with hypoxia ([place]) 11/23/2017    Past Surgical History:  Procedure Laterality Date  . CORONARY STENT INTERVENTION N/A 11/26/2017   Procedure: CORONARY STENT INTERVENTION;  Surgeon: Martinique, Peter M, MD;  Location: [facility];  Service: Cardiovascular;  Laterality: N/A;  . CORONARY STENT PLACEMENT    . LEFT HEART CATH AND CORONARY ANGIOGRAPHY N/A 11/26/2017   Procedure: LEFT HEART CATH AND CORONARY ANGIOGRAPHY;  Surgeon: Martinique, Peter M, MD;  Location: [facility];  Service: Cardiovascular;  Laterality: N/A;     OB History   No obstetric history  on file.      Home Medications    Prior to Admission medications   Medication Sig Start Date End Date Taking? Authorizing Provider  aspirin 81 MG chewable tablet Chew 1 tablet (81 mg total) by mouth daily. Patient taking differently: Chew 81 mg by mouth every morning.  11/27/17  Yes Cheryln Manly, NP  atorvastatin (LIPITOR) 80 MG tablet Take 80 mg by mouth every morning.    Yes [provider]  b complex vitamins tablet Take 1 tablet by mouth every morning.     Yes [provider]  budesonide (PULMICORT) 0.25 MG/2ML nebulizer solution Take 2 mLs (0.25 mg total) by nebulization 2 (two) times daily. 09/21/18 09/21/19 Yes Erline Hau, MD  Calcium Citrate-Vitamin D (CITRACAL MAXIMUM) 315-250 MG-UNIT TABS Take 2 tablets by mouth 2 (two) times daily.   Yes [provider]  clopidogrel (PLAVIX) 75 MG tablet Take 1 tablet (75 mg total) by mouth daily. Patient taking differently: Take 75 mg by mouth every morning.  12/03/17  Yes [facility], Tami Lin, PA  cyclobenzaprine (FLEXERIL) 10 MG tablet Take 10 mg by mouth 3 (three) times daily as needed for muscle spasms.   Yes [provider]  furosemide (LASIX) 40 MG tablet Take 1 tablet (40 mg total) by mouth daily. Patient taking differently: Take 40 mg by mouth every morning.  09/21/18 09/21/19 Yes Erline Hau, MD  gabapentin (NEURONTIN) 300 MG capsule Take 300 mg by mouth every morning.   Yes [provider]  gabapentin (NEURONTIN) 400 MG capsule Take 800 mg by mouth at bedtime.   Yes [provider]  letrozole (FEMARA) 2.5 MG tablet Take 2.5 mg by mouth at bedtime.    Yes [provider]  levalbuterol (XOPENEX) 0.63 MG/3ML nebulizer solution Take 3 mLs (0.63 mg total) by nebulization every 8 (eight) hours as needed for wheezing or shortness of breath. 11/14/18  Yes Barton Dubois, MD  losartan (COZAAR) 50 MG tablet Take 2 tablets (100 mg total) by mouth every morning. 11/14/18  Yes Barton Dubois, MD  metoprolol succinate (TOPROL-XL) 25 MG 24 hr tablet Take 1 tablet (25 mg total) by mouth every evening. 11/14/18  Yes Barton Dubois, MD  Multiple Vitamins-Minerals (EQ VISION FORMULA 50+ PO) Take 1 tablet by mouth every morning.   Yes [provider]  Multiple Vitamins-Minerals (HAIR/SKIN/NAILS) CAPS Take 1 capsule by mouth every morning.   Yes [provider]  nitroGLYCERIN (NITROSTAT) 0.4 MG SL tablet Place 1 tablet (0.4  mg total) under the tongue every 5 (five) minutes as needed. Patient taking differently: Place 0.4 mg under the tongue every 5 (five) minutes as needed for chest pain.  11/27/17  Yes Reino Bellis B, NP  Oxycodone HCl 10 MG TABS Take 10 mg by mouth every 6 (six) hours as needed (for pain).   Yes [provider]  Potassium 99 MG TABS Take 1 tablet by mouth every morning.   Yes [provider]  Ubiquinol (QUNOL COQ10/UBIQUINOL/MEGA) 100 MG CAPS Take 1 capsule by mouth every morning. QUNOL-Ultra COQ10   Yes [provider]  ipratropium (ATROVENT) 0.02 % nebulizer solution Take 2.5 mLs (0.5 mg total) by nebulization every 12 (twelve) hours. Patient not taking: Reported on 11/30/2018 11/14/18   Barton Dubois, MD  levalbuterol [facility] HFA) 45 MCG/ACT inhaler Inhale 2 puffs into the lungs every 8 (eight) hours as needed for wheezing or shortness of breath. 11/14/18 11/14/19  Barton Dubois, MD  Family History Family History  Problem Relation Age of Onset  . Sudden Cardiac Death Neg Hx     Social History Social History   Tobacco Use  . Smoking status: Former Smoker    Packs/day: 2.00    Years: 18.00    Pack years: 36.00    Types: Cigarettes    Last attempt to quit: 12/01/1980    Years since quitting: 38.0  . Smokeless tobacco: Never Used  Substance Use Topics  . Alcohol use: No    Frequency: Never  . Drug use: No     Allergies   Brilinta [ticagrelor] and Albuterol   Review of Systems Review of Systems  Constitutional: Negative for fever.  HENT: Negative for sore throat.   Eyes: Negative for redness.  Respiratory: Positive for shortness of breath.   Cardiovascular: Positive for leg swelling.  Gastrointestinal: Negative for abdominal pain, diarrhea and vomiting.  Endocrine: Negative for polyuria.  Genitourinary: Negative for dysuria and flank pain.  Musculoskeletal: Negative for back pain and neck pain.  Skin: Negative for rash.  Neurological:  Negative for headaches.  Hematological: Does not bruise/bleed easily.  Psychiatric/Behavioral: Negative for confusion.     Physical Exam Updated Vital Signs BP (!) 121/59   Pulse 79   Temp 97.8 F (36.6 C) (Oral)   Resp 19   SpO2 99%   Physical Exam Vitals signs and nursing note reviewed.  Constitutional:      Appearance: Normal appearance. She is well-developed.     Comments: On cpap, resp distress.   HENT:     Head: Atraumatic.     Nose: Nose normal.     Mouth/Throat:     Mouth: Mucous membranes are moist.     Pharynx: Oropharynx is clear.  Eyes:     General: No scleral icterus.    Conjunctiva/sclera: Conjunctivae normal.  Neck:     Musculoskeletal: Normal range of motion and neck supple. No neck rigidity or muscular tenderness.     Trachea: No tracheal deviation.  Cardiovascular:     Pulses: Normal pulses.     Heart sounds: Normal heart sounds. No murmur. No friction rub. No gallop.      Comments: Tachycardic, irregular.  Pulmonary:     Effort: Respiratory distress present.     Breath sounds: Rales present.     Comments: Mild wheezing. Basilar rales. resp distress.  Abdominal:     General: Bowel sounds are normal. There is no distension.     Palpations: Abdomen is soft.     Tenderness: There is no abdominal tenderness.  Genitourinary:    Comments: No cva tenderness.  Musculoskeletal:        General: Swelling present.     Right lower leg: Edema present.     Left lower leg: Edema present.     Comments: Bilateral leg edema, moderate. No calf pain or tenderness.   Skin:    General: Skin is warm and dry.     Findings: No rash.  Neurological:     Mental Status: She is alert.     Comments: Motor/sens grossly intact bil. Responds appropriately to questions.   Psychiatric:        Mood and Affect: Mood normal.      ED Treatments / Results  Labs (all labs ordered are listed, but only abnormal results are displayed) Results for orders placed or performed  during the hospital encounter of 11/30/18  CBC  Result Value Ref Range   WBC 8.5 4.0 - 10.5  K/uL   RBC 4.00 3.87 - 5.11 MIL/uL   Hemoglobin 12.3 12.0 - 15.0 g/dL   HCT 40.7 36.0 - 46.0 %   MCV 101.8 (H) 80.0 - 100.0 fL   MCH 30.8 26.0 - 34.0 pg   MCHC 30.2 30.0 - 36.0 g/dL   RDW 14.9 11.5 - 15.5 %   Platelets 186 150 - 400 K/uL   nRBC 0.0 0.0 - 0.2 %  Basic metabolic panel  Result Value Ref Range   Sodium 143 135 - 145 mmol/L   Potassium 4.1 3.5 - 5.1 mmol/L   Chloride 108 98 - 111 mmol/L   CO2 24 22 - 32 mmol/L   Glucose, Bld 194 (H) 70 - 99 mg/dL   BUN 15 8 - 23 mg/dL   Creatinine, Ser 0.79 0.44 - 1.00 mg/dL   Calcium 8.9 8.9 - 10.3 mg/dL   GFR calc non Af Amer >60 >60 mL/min   GFR calc Af Amer >60 >60 mL/min   Anion gap 11 5 - 15  Brain natriuretic peptide  Result Value Ref Range   B Natriuretic Peptide 552.6 (H) 0.0 - 100.0 pg/mL  Troponin I - ONCE - STAT  Result Value Ref Range   Troponin I <0.03 <0.03 ng/mL   Ct Head Wo Contrast  Result Date: 11/13/2018 CLINICAL DATA:  Head trauma, minor, GCS>=13, high clinical risk, initial exam. Headache after fall yesterday striking head on night table after getting up too fast. EXAM: CT HEAD WITHOUT CONTRAST TECHNIQUE: Contiguous axial images were obtained from the base of the skull through the vertex without intravenous contrast. COMPARISON:  None. FINDINGS: Brain: Tiny lacunar infarct in the right caudate. No intracranial hemorrhage, mass effect, or midline shift. No hydrocephalus. The basilar cisterns are patent. No evidence of territorial infarct or acute ischemia. No extra-axial or intracranial fluid collection. Vascular: Atherosclerosis of skullbase vasculature without hyperdense vessel or abnormal calcification. Skull: No fracture or focal lesion. Sinuses/Orbits: Paranasal sinuses and mastoid air cells are clear. The visualized orbits are unremarkable. Other: None. IMPRESSION: No acute intracranial abnormality. No skull fracture.  Electronically Signed   By: Keith Rake M.D.   On: 11/13/2018 23:53   Dg Chest Port 1 View  Result Date: 11/30/2018 CLINICAL DATA:  Shortness of breath EXAM: PORTABLE CHEST 1 VIEW COMPARISON:  10/06/2018 FINDINGS: Small pleural effusions. Cardiomegaly with vascular congestion and mild diffuse interstitial edema. Patchy atelectasis or infiltrate at the left base. Aortic atherosclerosis. No pneumothorax. IMPRESSION: 1. Cardiomegaly with vascular congestion and mild interstitial pulmonary edema and small pleural effusions 2. Patchy atelectasis or infiltrate at the left base Electronically Signed   By: Donavan Foil M.D.   On: 11/30/2018 19:23    EKG EKG Interpretation  Date/Time:  Tuesday November 30 2018 18:57:26 EST Ventricular Rate:  151 PR Interval:    QRS Duration: 140 QT Interval:  337 QTC Calculation: 535 R Axis:   -98 Text Interpretation:  Atrial fibrillation with rapid ventricular response Left bundle branch block Confirmed by Lajean Saver [phone]) on 11/30/2018 7:00:48 PM   Radiology Dg Chest Port 1 View  Result Date: 11/30/2018 CLINICAL DATA:  Shortness of breath EXAM: PORTABLE CHEST 1 VIEW COMPARISON:  10/06/2018 FINDINGS: Small pleural effusions. Cardiomegaly with vascular congestion and mild diffuse interstitial edema. Patchy atelectasis or infiltrate at the left base. Aortic atherosclerosis. No pneumothorax. IMPRESSION: 1. Cardiomegaly with vascular congestion and mild interstitial pulmonary edema and small pleural effusions 2. Patchy atelectasis or infiltrate at the left base Electronically Signed  By: Donavan Foil M.D.   On: 11/30/2018 19:23    Procedures Procedures (including critical care time)  Medications Ordered in ED Medications  diltiazem (CARDIZEM) 1 mg/mL load via infusion 20 mg (20 mg Intravenous Bolus from Bag 11/30/18 1948)    And  diltiazem (CARDIZEM) 100 mg in dextrose 5% 156mL (1 mg/mL) infusion (5 mg/hr Intravenous New Bag/Given 11/30/18 1947)    nitroGLYCERIN (NITROGLYN) 2 % ointment 1 inch (1 inch Topical Not Given 11/30/18 2042)  ipratropium (ATROVENT) nebulizer solution 0.5 mg (0.5 mg Nebulization Given 11/30/18 1939)  furosemide (LASIX) injection 60 mg (60 mg Intravenous Given 11/30/18 1955)  levalbuterol (XOPENEX) nebulizer solution 0.63 mg (0.63 mg Nebulization Given 11/30/18 2019)     Initial Impression / Assessment and Plan / ED Course  I have reviewed the triage vital signs and the nursing notes.  Pertinent labs & imaging results that were available during my care of the patient were reviewed by me and considered in my medical decision making (see chart for details).  Iv ns. Continuous pulse ox and monitor.   Respiratory therapy consulted - patient placed on bipap - settings titrated.   Lasix iv. Stat labs and stat pcxr.   Rapid afib on monitor. cardizem bolus/gtt - titrated.  Recheck bp is elevated - nitropaste.   With above treatments/meds, dyspnea slowly improved. Heart rate controlled.   Labs reviewed - bnp elevated.   cxr reviewed - vascular congestion, chf.   Reviewed nursing notes and prior charts for additional history. Prior echo - ef 30%.   Trial off bipap.   CRITICAL CARE  RE: acute respiratory distress/failure, CHF/pulm edema, afib with rapid ventricular response, bipap.  Performed by: Mirna Mires Total critical care time: 40 minutes Critical care time was exclusive of separately billable procedures and treating other patients. Critical care was necessary to treat or prevent imminent or life-threatening deterioration. Critical care was time spent personally by me on the following activities: development of treatment plan with patient and/or surrogate as well as nursing, discussions with consultants, evaluation of patient's response to treatment, examination of patient, obtaining history from patient or surrogate, ordering and performing treatments and interventions, ordering and review of  laboratory studies, ordering and review of radiographic studies, pulse oximetry and re-evaluation of patient's condition.  Hospitalists consulted for admission.   Final Clinical Impressions(s) / ED Diagnoses   Final diagnoses:  None    ED Discharge Orders    None         Lajean Saver, MD 11/30/18 2129

## 2018-11-30 NOTE — ED Notes (Signed)
Pt's O2 sats 89-91% on 2 L [place]. RN increased O2 to 4L, sats increased to 94-95%, MD Ashok Cordia made aware

## 2018-11-30 NOTE — Progress Notes (Signed)
Pt placed on 2L [place] at this time per MD Lindzen order. Pt tolerating well at this time. RT to continue to monitor as needed.

## 2018-11-30 NOTE — Progress Notes (Signed)
Pt arrived on unit. MD notified.

## 2018-11-30 NOTE — ED Triage Notes (Signed)
Per [facility], Pt was at [facility] for radiation appointment. Pt had sudden onset SHOB and palpitations on the way home. Pt was found by EMS in A-fib RVR 120-160, O2 sats 73%. Pt reports some chest tightness earlier. Pt trialed off CPAP, pt became anxious,  diaphoretic with increased work of breathing. Pt alert and oriented at this time. HR 160. MD Ashok Cordia at bedside

## 2018-11-30 NOTE — ED Notes (Signed)
Attempted report x1. 

## 2018-12-01 ENCOUNTER — Inpatient Hospital Stay (HOSPITAL_COMMUNITY): Payer: Medicare Other

## 2018-12-01 DIAGNOSIS — I340 Nonrheumatic mitral (valve) insufficiency: Secondary | ICD-10-CM

## 2018-12-01 DIAGNOSIS — I361 Nonrheumatic tricuspid (valve) insufficiency: Secondary | ICD-10-CM

## 2018-12-01 DIAGNOSIS — Z8709 Personal history of other diseases of the respiratory system: Secondary | ICD-10-CM

## 2018-12-01 LAB — TROPONIN I
Troponin I: 0.04 ng/mL (ref <0.03)
Troponin I: 0.03 ng/mL (ref <0.03)
Troponin I: 0.06 ng/mL (ref <0.03)

## 2018-12-01 LAB — TSH: TSH: 1.434 u[IU]/mL (ref 0.350–4.500)

## 2018-12-01 LAB — BASIC METABOLIC PANEL
Anion gap: 8 (ref 5–15)
BUN: 19 mg/dL (ref 8–23)
CO2: 31 mmol/L (ref 22–32)
CREATININE: 0.88 mg/dL (ref 0.44–1.00)
Calcium: 8.7 mg/dL — ABNORMAL LOW (ref 8.9–10.3)
Chloride: 103 mmol/L (ref 98–111)
GFR calc Af Amer: >60 mL/min (ref >60)
GFR calc non Af Amer: >60 mL/min (ref >60)
Glucose, Bld: 109 mg/dL — ABNORMAL HIGH (ref 70–99)
Potassium: 4.4 mmol/L (ref 3.5–5.1)
Sodium: 142 mmol/L (ref 135–145)

## 2018-12-01 LAB — MAGNESIUM
MAGNESIUM: 2 mg/dL (ref 1.7–2.4)
Magnesium: 2.1 mg/dL (ref 1.7–2.4)

## 2018-12-01 LAB — ECHOCARDIOGRAM COMPLETE
HEIGHTINCHES: 60 in
Weight: 4003.2 oz

## 2018-12-01 LAB — MRSA PCR SCREENING: MRSA by PCR: NEGATIVE

## 2018-12-01 MED ORDER — IPRATROPIUM BROMIDE 0.02 % IN SOLN
0.5000 mg | Freq: Three times a day (TID) | RESPIRATORY_TRACT | Status: DC
  Administered 2018-12-02 (×2): 0.5 mg via RESPIRATORY_TRACT
  Filled 2018-12-01 (×2): qty 2.5

## 2018-12-01 MED ORDER — FUROSEMIDE 10 MG/ML IJ SOLN
60.0000 mg | Freq: Two times a day (BID) | INTRAMUSCULAR | Status: DC
  Administered 2018-12-01 – 2018-12-02 (×3): 60 mg via INTRAVENOUS
  Filled 2018-12-01 (×3): qty 6

## 2018-12-01 MED ORDER — BUDESONIDE 0.25 MG/2ML IN SUSP
0.2500 mg | Freq: Two times a day (BID) | RESPIRATORY_TRACT | Status: DC
  Administered 2018-12-01 – 2018-12-02 (×3): 0.25 mg via RESPIRATORY_TRACT
  Filled 2018-12-01 (×3): qty 2

## 2018-12-01 MED ORDER — ARFORMOTEROL TARTRATE 15 MCG/2ML IN NEBU
15.0000 ug | INHALATION_SOLUTION | Freq: Two times a day (BID) | RESPIRATORY_TRACT | Status: DC
  Administered 2018-12-01 – 2018-12-02 (×3): 15 ug via RESPIRATORY_TRACT
  Filled 2018-12-01 (×3): qty 2

## 2018-12-01 NOTE — Plan of Care (Signed)
  Problem: Education: Goal: Ability to demonstrate management of disease process will improve Outcome: Progressing Goal: Ability to verbalize understanding of medication therapies will improve Outcome: Progressing Goal: Individualized Educational Video(s) Outcome: Progressing   Problem: Activity: Goal: Capacity to carry out activities will improve Outcome: Progressing   Problem: Cardiac: Goal: Ability to achieve and maintain adequate cardiopulmonary perfusion will improve Outcome: Progressing   Problem: Education: Goal: Understanding of cardiac disease, CV risk reduction, and recovery process will improve Outcome: Progressing Goal: Individualized Educational Video(s) Outcome: Progressing   Problem: Activity: Goal: Ability to tolerate increased activity will improve Outcome: Progressing   Problem: Cardiac: Goal: Ability to achieve and maintain adequate cardiovascular perfusion will improve Outcome: Progressing   Problem: Health Behavior/Discharge Planning: Goal: Ability to safely manage health-related needs after discharge will improve Outcome: Progressing

## 2018-12-01 NOTE — Plan of Care (Signed)
  Problem: Education: Goal: Ability to demonstrate management of disease process will improve Outcome: Progressing Goal: Ability to verbalize understanding of medication therapies will improve Outcome: Progressing   Problem: Activity: Goal: Capacity to carry out activities will improve Outcome: Progressing   Problem: Cardiac: Goal: Ability to achieve and maintain adequate cardiopulmonary perfusion will improve Outcome: Progressing   Problem: Education: Goal: Understanding of cardiac disease, CV risk reduction, and recovery process will improve Outcome: Progressing   Problem: Activity: Goal: Ability to tolerate increased activity will improve Outcome: Progressing

## 2018-12-01 NOTE — Progress Notes (Signed)
PROGRESS NOTE    Stacey [name]  HLK:[id number] DOB: [birth year] DOA: 11/30/2018 PCP: Glenda Chroman, MD    Brief Narrative:  Stacey [name] is a 73 y.o. 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   Assessment & Plan:   Active Problems:   Atrial fibrillation with rapid ventricular response (HCC)  Atrial fibrillation with RVR:  Secondary to acute CHF.  - rate better and converted to sinus with metoprolol 25 mg every 6 hours. .  - echocardiogram reviewed.  - trend troponin's.  - TSH wnl.  - on LOVENOX for anti coagulation.   Acute on chornic  systolic heart failure:  Start her on IV lasix 60 BID.  Strict intake and output.  Daily weights.    Ischemic cardiomyopathy:  Repeat eCHO today.  Continue with aspirin, plavix, statin, Losartan and BB.     Chronic respiratory failure with hypoxia secondary to COPD and OSA and OHS: duonebs and [place] oxygen to keep sats greater than 90%.  Hypertension:  Well controlled.     DVT prophylaxis: lovenox.  Code Status: ( full code.  Family Communication: none at bedside.  Disposition Plan: pending further eval and clinical improvement.    Consultants:   None.    Procedures: Echocardiogram.   Antimicrobials: none.   Subjective: No chest pain, sob improved.   Objective: Vitals:   12/01/18 0844 12/01/18 0846 12/01/18 0847 12/01/18 1202  BP:    106/66  Pulse:    94  Resp:    14  Temp:    98.3 F (36.8 C)  TempSrc:    Oral  SpO2: 97% 99% 98% 95%  Weight:      Height:        Intake/Output Summary (Last 24 hours) at 12/01/2018 1416 Last data filed at 12/01/2018 1025 Gross per 24 hour  Intake 153.31 ml  Output 1160 ml  Net -1006.69 ml   Filed  Weights   11/30/18 2300 12/01/18 0504  Weight: 113.9 kg 113.5 kg    Examination:  General exam: mild distress from sob.  Respiratory system: bilateral crackles at bases, no wheezing.  Cardiovascular system: S1 & S2 heard, tachycardic, irregular.  Gastrointestinal system: Abdomen is nondistended, soft and nontender.  Normal bowel sounds heard. Central nervous system: Alert and oriented. Non focal. . Extremities: no cyanosis or clubbing.  Skin: No rashes, lesions or ulcers Psychiatry: . Mood & affect appropriate.     Data Reviewed: I have personally reviewed following labs and imaging studies  CBC: Recent Labs  Lab 11/30/18 1857  WBC 8.5  HGB 12.3  HCT 40.7  MCV 101.8*  PLT 734   Basic Metabolic Panel: Recent Labs  Lab 11/30/18 1857 11/30/18 2358 12/01/18 0541  NA 143  --  142  K 4.1  --  4.4  CL 108  --  103  CO2 24  --  31  GLUCOSE 194*  --  109*  BUN 15  --  19  CREATININE 0.79  --  0.88  CALCIUM 8.9  --  8.7*  MG  --  2.1 2.0   GFR: Estimated Creatinine Clearance: 67.3 mL/min (by C-G formula based on SCr of 0.88 mg/dL). Liver Function Tests: No results for input(s): AST, ALT, ALKPHOS, BILITOT, PROT,  ALBUMIN in the last 168 hours. No results for input(s): LIPASE, AMYLASE in the last 168 hours. No results for input(s): AMMONIA in the last 168 hours. Coagulation Profile: No results for input(s): INR, PROTIME in the last 168 hours. Cardiac Enzymes: Recent Labs  Lab 11/30/18 1940 11/30/18 2358 12/01/18 0541 12/01/18 1129  TROPONINI <0.03 0.04* 0.03* 0.06*   BNP (last 3 results) No results for input(s): PROBNP in the last 8760 hours. HbA1C: No results for input(s): HGBA1C in the last 72 hours. CBG: No results for input(s): GLUCAP in the last 168 hours. Lipid Profile: No results for input(s): CHOL, HDL, LDLCALC, TRIG, CHOLHDL, LDLDIRECT in the last 72 hours. Thyroid Function Tests: Recent Labs    11/30/18 2358  TSH 1.434   Anemia Panel: No  results for input(s): VITAMINB12, FOLATE, FERRITIN, TIBC, IRON, RETICCTPCT in the last 72 hours. Sepsis Labs: No results for input(s): PROCALCITON, LATICACIDVEN in the last 168 hours.  Recent Results (from the past 240 hour(s))  MRSA PCR Screening     Status: None   Collection Time: 12/01/18 12:56 AM  Result Value Ref Range Status   MRSA by PCR NEGATIVE NEGATIVE Final    Comment:        The GeneXpert MRSA Assay (FDA approved for NASAL specimens only), is one component of a comprehensive MRSA colonization surveillance program. It is not intended to diagnose MRSA infection nor to guide or monitor treatment for MRSA infections. Performed at [facility], [place] [address] [place], [place] [address]          Radiology Studies: Dg Chest Port 1 View  Result Date: 11/30/2018 CLINICAL DATA:  Shortness of breath EXAM: PORTABLE CHEST 1 VIEW COMPARISON:  10/06/2018 FINDINGS: Small pleural effusions. Cardiomegaly with vascular congestion and mild diffuse interstitial edema. Patchy atelectasis or infiltrate at the left base. Aortic atherosclerosis. No pneumothorax. IMPRESSION: 1. Cardiomegaly with vascular congestion and mild interstitial pulmonary edema and small pleural effusions 2. Patchy atelectasis or infiltrate at the left base Electronically Signed   By: Donavan Foil M.D.   On: 11/30/2018 19:23        Scheduled Meds: . arformoterol  15 mcg Nebulization BID  . aspirin  81 mg Oral Daily  . atorvastatin  80 mg Oral q morning - 10a  . budesonide  0.25 mg Nebulization BID  . clopidogrel  75 mg Oral Daily  . enoxaparin (LOVENOX) injection  1 mg/kg (Order-Specific) Subcutaneous Q12H  . furosemide  60 mg Intravenous Q12H  . gabapentin  300 mg Oral q morning - 10a  . gabapentin  800 mg Oral QHS  . ipratropium  0.5 mg Nebulization Q6H  . letrozole  2.5 mg Oral QHS  . losartan  100 mg Oral q morning - 10a  . metoprolol tartrate  25 mg Oral Q6H  . sodium chloride flush  3 mL  Intravenous Q12H   Continuous Infusions: . sodium chloride       LOS: 1 day    Time spent: 35 minutes.   Hosie Poisson, MD [facility] Pager [phone]  If 7PM-7AM, please contact night-coverage [email or web address] Password [email or web address] 12/01/2018, 2:16 PM

## 2018-12-01 NOTE — Progress Notes (Signed)
  Echocardiogram 2D Echocardiogram has been performed.  Kou Gucciardo L Androw 12/01/2018, 3:38 PM

## 2018-12-01 NOTE — Progress Notes (Signed)
Paged MD to notify of the troponin increased to 0.06 from last lab reading.  Page returned 3:50 MD was advised of troponin level.

## 2018-12-02 DIAGNOSIS — I4891 Unspecified atrial fibrillation: Principal | ICD-10-CM

## 2018-12-02 DIAGNOSIS — I5043 Acute on chronic combined systolic (congestive) and diastolic (congestive) heart failure: Secondary | ICD-10-CM

## 2018-12-02 DIAGNOSIS — R0603 Acute respiratory distress: Secondary | ICD-10-CM

## 2018-12-02 LAB — BASIC METABOLIC PANEL
Anion gap: 11 (ref 5–15)
BUN: 24 mg/dL — ABNORMAL HIGH (ref 8–23)
CHLORIDE: 97 mmol/L — AB (ref 98–111)
CO2: 33 mmol/L — ABNORMAL HIGH (ref 22–32)
Calcium: 8.5 mg/dL — ABNORMAL LOW (ref 8.9–10.3)
Creatinine, Ser: 0.96 mg/dL (ref 0.44–1.00)
GFR calc Af Amer: >60 mL/min (ref >60)
GFR calc non Af Amer: 60 mL/min — ABNORMAL LOW (ref >60)
Glucose, Bld: 112 mg/dL — ABNORMAL HIGH (ref 70–99)
Potassium: 3.9 mmol/L (ref 3.5–5.1)
Sodium: 141 mmol/L (ref 135–145)

## 2018-12-02 MED ORDER — APIXABAN 5 MG PO TABS: 5.0000 mg | ORAL_TABLET | Freq: Two times a day (BID) | ORAL | 1 refills | Status: DC

## 2018-12-02 MED ORDER — APIXABAN 5 MG PO TABS: 5.0000 mg | ORAL_TABLET | Freq: Two times a day (BID) | ORAL | Status: DC

## 2018-12-02 MED ORDER — FUROSEMIDE 80 MG PO TABS: 80.0000 mg | ORAL_TABLET | Freq: Every day | ORAL | Status: DC

## 2018-12-02 MED ORDER — METOPROLOL SUCCINATE ER 100 MG PO TB24: 100.0000 mg | ORAL_TABLET | Freq: Every day | ORAL | Status: DC

## 2018-12-02 MED ORDER — FUROSEMIDE 80 MG PO TABS: 80.0000 mg | ORAL_TABLET | Freq: Every day | ORAL | 0 refills | Status: DC

## 2018-12-02 MED ORDER — METOPROLOL SUCCINATE ER 100 MG PO TB24: 100.0000 mg | ORAL_TABLET | Freq: Every day | ORAL | 0 refills | Status: DC

## 2018-12-02 NOTE — Consult Note (Signed)
Cardiology Consultation:   Patient ID: RAECHAL RABEN MRN: [id number]; DOB: 1946/04/24  Admit date: 11/30/2018 Date of Consult: 12/02/2018  Primary Care Provider: Glenda Chroman, MD Primary Cardiologist: Dr. Chryl [name] [facility])   Patient Profile:   Stacey [name] is a 73 y.o. 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 Karleen [name].    She had cath performed on 11/26/17 and noted to have a 90% stenosis of the RCA with DES placed. She was D/C'ed home on DAPT including ASA and ticagrelor.  However readmitted 12/2017 for dyspnea. Rhis was felt to be due to Sims. She has been changed to Plavix and felt better.  Echo 12/2017 showed LVEF of 30-35 % which is down from 50-55% from 10/2017. EF was also down in 12/2016. Advised to follow up with regular cardiologist.   Last seen by Dr. Chryl [name] 11/09/18. Felt stable from cardiac stand point. uptirated BB. Did not felt candidate for ICD given her metastatic cancer and numerous comorbidities and poor long-term prognosis. Plan to establish care with palliative.   Undergoing radiation of spine metastasis at [facility].    History of Present Illness:   Stacey [name] Presented 11/30/18 with 2 days hx of dyspnea and palpitations at [facility] after radiation therapy at [facility] (same day) for spine metastasis. Also had orthopnea and PND despite compliance with diuretics, BB and ARB. Noted in afib with RVR, started on IV cardizem with anticoagulation dose of Lovenox. Converted to sinus rhythm.  Now discontinued Cardizem. BNP 552. Started on IV lasix. Diuresed negative 3.1L. weigh loss of 6lb (251>>>245lb).    Currently on aspirin 81 mg daily, Lipitor 80 mg daily, Plavix 75 mg daily, Lasix IV 60 mg twice daily, losartan 100 mg daily and lopressor 25 mg every 6 hours.  Patient denies any chest pain or symptoms concerning for angina.  Her breathing and edema has been improved significantly.  No palpitation.  Denies syncope or melena.  Echo this admission 12/01/17  - Left ventricle: The cavity size was normal. Wall thickness was   normal. Systolic function was severely reduced. The estimated   ejection fraction was in the range of 20% to 25%. Diffuse   hypokinesis. Doppler parameters are consistent with restrictive   physiology, indicative of decreased left ventricular diastolic   compliance and/or increased left atrial pressure. Doppler   parameters are consistent with high ventricular filling pressure. - Aortic valve: There was mild stenosis. Valve area (VTI): 1.69   cm^2. Valve area (Vmax): 1.73 cm^2. Valve area (Vmean): 1.74   cm^2. - Mitral valve: Calcified annulus. There was mild regurgitation. - Left atrium: The atrium was mildly dilated. - Atrial septum: No defect or patent foramen ovale was identified. - Tricuspid valve: There was mild regurgitation. - Pulmonary arteries: PA peak pressure: 40 mm Hg (S).  LVEF of 30% on 08/2018; 30-35% 12/2017; 50-55% 10/2017.   Past Medical History:  Diagnosis Date  . CAD in native artery    a.DES to ramus 2005 with late stent thrombosis 2006 tx with PTCA. 12/18 PCI/DES x1 to mRCA, EF 50-55%  . CHF (congestive heart failure) ([place])   . Chronic  pain   . COPD (chronic obstructive pulmonary disease) ([place])   . Hyperlipidemia   . Hypertension   . LBBB (left bundle branch block)   . Left bundle branch block   . Lymphedema   . Metastatic breast cancer ([place])    a. to bone.  . MI (myocardial infarction) ([place])   . Mild aortic stenosis 10/2017  . Morbid obesity ([place])   . PSVT (paroxysmal supraventricular tachycardia) ([place])    a. per [facility] notes, seen  on event monitor in 2014.  . Pulmonary nodules     Past Surgical History:  Procedure Laterality Date  . CORONARY STENT INTERVENTION N/A 11/26/2017   Procedure: CORONARY STENT INTERVENTION;  Surgeon: Martinique, Peter M, MD;  Location: [place] [facility];  Service: Cardiovascular;  Laterality: N/A;  . CORONARY STENT PLACEMENT    . LEFT HEART CATH AND CORONARY ANGIOGRAPHY N/A 11/26/2017   Procedure: LEFT HEART CATH AND CORONARY ANGIOGRAPHY;  Surgeon: Martinique, Peter M, MD;  Location: [place] [facility];  Service: Cardiovascular;  Laterality: N/A;     Inpatient Medications: Scheduled Meds: . arformoterol  15 mcg Nebulization BID  . aspirin  81 mg Oral Daily  . atorvastatin  80 mg Oral q morning - 10a  . budesonide  0.25 mg Nebulization BID  . clopidogrel  75 mg Oral Daily  . enoxaparin (LOVENOX) injection  1 mg/kg (Order-Specific) Subcutaneous Q12H  . furosemide  60 mg Intravenous Q12H  . gabapentin  300 mg Oral q morning - 10a  . gabapentin  800 mg Oral QHS  . ipratropium  0.5 mg Nebulization TID  . letrozole  2.5 mg Oral QHS  . losartan  100 mg Oral q morning - 10a  . metoprolol tartrate  25 mg Oral Q6H  . sodium chloride flush  3 mL Intravenous Q12H   Continuous Infusions: . sodium chloride     PRN Meds: sodium chloride, acetaminophen, levalbuterol, ondansetron (ZOFRAN) IV, oxyCODONE, sodium chloride flush  Allergies:    Allergies  Allergen Reactions  . Brilinta [Ticagrelor] Other (See Comments)    Nausea and severe diarrhea- general weakness. Patient says does not want to take again  . Albuterol Other (See Comments)    Heart racing     Social History:   Social History   Socioeconomic History  . Marital status: Single    Spouse name: Not on file  . Number of children: Not on file  . Years of education: Not on file  . Highest education level: Not on file  Occupational History  . Occupation: Retired  Scientific laboratory technician  . Financial resource strain: Not on file  . Food  insecurity:    Worry: Not on file    Inability: Not on file  . Transportation needs:    Medical: Not on file    Non-medical: Not on file  Tobacco Use  . Smoking status: Former Smoker    Packs/day: 2.00    Years: 18.00    Pack years: 36.00    Types: Cigarettes    Last attempt to quit: 12/01/1980    Years since quitting: 38.0  . Smokeless tobacco: Never Used  Substance and Sexual Activity  . Alcohol use: No    Frequency: Never  . Drug use: No  . Sexual activity: Not on file  Lifestyle  . Physical activity:    Days per week: Not on file    Minutes per session: Not on file  . Stress: Not on file  Relationships  .  Social connections:    Talks on phone: Not on file    Gets together: Not on file    Attends religious service: Not on file    Active member of club or organization: Not on file    Attends meetings of clubs or organizations: Not on file    Relationship status: Not on file  . Intimate partner violence:    Fear of current or ex partner: Not on file    Emotionally abused: Not on file    Physically abused: Not on file    Forced sexual activity: Not on file  Other Topics Concern  . Not on file  Social History Narrative  . Not on file    Family History:   Family History  Problem Relation Age of Onset  . Sudden Cardiac Death Neg Hx      ROS:  Please see the history of present illness.  All other ROS reviewed and negative.     Physical Exam/Data:   Vitals:   12/02/18 0733 12/02/18 1107 12/02/18 1123 12/02/18 1400  BP: (!) 113/59 134/76 121/63   Pulse: 86 93 90 89  Resp: (!) 22 (!) 24  15  Temp: 97.7 F (36.5 C) 98.6 F (37 C)    TempSrc: Oral Oral    SpO2:  99%  96%  Weight:      Height:        Intake/Output Summary (Last 24 hours) at 12/02/2018 1419 Last data filed at 12/02/2018 1330 Gross per 24 hour  Intake 720 ml  Output 3200 ml  Net -2480 ml   Filed Weights   11/30/18 2300 12/01/18 0504 12/02/18 0500  Weight: 113.9 kg 113.5 kg 111.5 kg   Body  mass index is 48.01 kg/m.  General: Morbidly obese female in no acute distress HEENT: normal Lymph: no adenopathy Neck: no JVD Endocrine:  No thryomegaly Vascular: No carotid bruits; FA pulses 2+ bilaterally without bruits  Cardiac:  normal S1, S2; RRR; no murmur Lungs: Diminished breath sounds without rales Abd: soft, nontender, no hepatomegaly  Ext: Trace edema Musculoskeletal:  No deformities, BUE and BLE strength normal and equal Skin: warm and dry  Neuro:  CNs 2-12 intact, no focal abnormalities noted Psych:  Normal affect   EKG:  The EKG was personally reviewed and demonstrates: Atrial fibrillation at rate of 151 beats per minute, chronic left bundle branch block Telemetry:  Telemetry was personally reviewed and demonstrates: Sinus rhythm at 80s  Relevant CV Studies: As above  CORONARY STENT INTERVENTION 11/26/17  LEFT HEART CATH AND CORONARY ANGIOGRAPHY  Conclusion     Ost Ramus to Ramus lesion is 100% stenosed.  Mid RCA lesion is 90% stenosed.  A drug-eluting stent was successfully placed using a STENT SYNERGY DES 2.5X16.  Post intervention, there is a 0% residual stenosis.  LV end diastolic pressure is normal.   1. 2 vessel obstructive CAD    - 100% proximal ramus intermediate at site of prior stent. I think this is a CTO and she has good collaterals.     - 90% ulcerative mid RCA 2. Normal LVEDP 3. Successful PCI of the mid RCA with DES  Plan: DAPT with ASA and Brilinta for one year.      Laboratory Data:  Chemistry Recent Labs  Lab 11/30/18 1857 12/01/18 0541 12/02/18 0234  NA 143 142 141  K 4.1 4.4 3.9  CL 108 103 97*  CO2 24 31 33*  GLUCOSE 194* 109* 112*  BUN 15 19  24*  CREATININE 0.79 0.88 0.96  CALCIUM 8.9 8.7* 8.5*  GFRNONAA >60 >60 60*  GFRAA >60 >60 >60  ANIONGAP 11 8 11     No results for input(s): PROT, ALBUMIN, AST, ALT, ALKPHOS, BILITOT in the last 168 hours. Hematology Recent Labs  Lab 11/30/18 1857  WBC 8.5  RBC 4.00    HGB 12.3  HCT 40.7  MCV 101.8*  MCH 30.8  MCHC 30.2  RDW 14.9  PLT 186   Cardiac Enzymes Recent Labs  Lab 11/30/18 1940 11/30/18 2358 12/01/18 0541 12/01/18 1129  TROPONINI <0.03 0.04* 0.03* 0.06*   No results for input(s): TROPIPOC in the last 168 hours.  BNP Recent Labs  Lab 11/30/18 1857  BNP 552.6*    Radiology/Studies:  Dg Chest Port 1 View  Result Date: 11/30/2018 CLINICAL DATA:  Shortness of breath EXAM: PORTABLE CHEST 1 VIEW COMPARISON:  10/06/2018 FINDINGS: Small pleural effusions. Cardiomegaly with vascular congestion and mild diffuse interstitial edema. Patchy atelectasis or infiltrate at the left base. Aortic atherosclerosis. No pneumothorax. IMPRESSION: 1. Cardiomegaly with vascular congestion and mild interstitial pulmonary edema and small pleural effusions 2. Patchy atelectasis or infiltrate at the left base Electronically Signed   By: Donavan Foil M.D.   On: 11/30/2018 19:23    Assessment and Plan:   1. Atrial fibrillation with rapid ventricular rate -New onset.  Unknown duration.  She was started on IV Cardizem and converted.  Maintaining sinus rhythm.  Cardizem is not good long-term medication given low EF. CHADSVASCs score of 5 for sex, age, CHF, hypertension and vascular disease.  She will need long-term anticoagulation.  Currently on Lovenox.  She will need NOAC. On lopressor 25 mg every 6 hours. At home TOPROL XL 25mg  daily.   2.  Chronic systolic heart failure/ischemic cardiomyopathy -Echocardiogram this admission showed LV function of 20 to 25%, diffuse hypokinesis and elevated left ventricular function (30% on 08/2018; 30-35% 12/2017; 50-55% 10/2017).  - Diuresed negative 3.1L. weigh loss of 6lb (251>>>245lb).  - Almost euvolemic. -Getting IV Lasix 60 mg twice daily.  She was on Lasix 40 mg daily at home. -Continue beta-blocker and ARB. -Did not felt candidate for ICD given comorbidities by primary cardiologist.  3. CAD -Last PCI to RCA December  2018.  Maintained on dual antiplatelet therapy with aspirin and Plavix.  No anginal symptoms.  Continue beta-blocker and statin.  4.  Breast cancer with spine metastasis -She is currently undergoing radiation for spine metastasis.  On letrozole. -Pending palliative evaluation as outpatient.  5.  Chronic respiratory failure secondary to COPD  -Per primary team  6. HTN -Blood pressure stable on current medication  Dr. Harrell Gave to see later today.    For questions or updates, please contact [place] Please consult [email or web address] for contact info under     Jarrett Soho, PA  12/02/2018 2:19 PM

## 2018-12-02 NOTE — Evaluation (Signed)
Physical Therapy Evaluation Patient Details Name: Stacey [name] MRN: [id number] DOB: [birth year] Today's Date: 12/02/2018   History of Present Illness  Pt is a 73 y.o. F with significant PMH of ER/PR positive for breast cancer metastatic to bone on letrozole and radiation therapy to her spine, CAD/MI, HFrEF, undocumneted COPD, hypertension and morbid obesity who presents with progressive shortness of breath and afib with RVR.  Clinical Impression  Pt admitted with above diagnosis. Pt currently with functional limitations due to the deficits listed below (see PT Problem List). Prior to admission, patient lives alone and is independent with ADL's and mobility using a walker. On PT evaluation, patient presenting with decreased functional mobility secondary to decreased activity tolerance, balance deficits, and difficulty walking. Ambulating 120 feet with walker and supervision. SpO2 > 90% on 2L O2.  Pt will benefit from skilled PT to increase their independence and safety with mobility to allow discharge home.      Follow Up Recommendations No PT follow up;Supervision - Intermittent (declining HHPT)    Equipment Recommendations  None recommended by PT    Recommendations for Other Services OT consult     Precautions / Restrictions Precautions Precautions: Fall Restrictions Weight Bearing Restrictions: No      Mobility  Bed Mobility Overal bed mobility: Modified Independent             General bed mobility comments: Increased effort and momentum required for sit > supine  Transfers Overall transfer level: Needs assistance Equipment used: Rolling walker (2 wheeled) Transfers: Sit to/from Stand Sit to Stand: Supervision            Ambulation/Gait Ambulation/Gait assistance: Supervision Gait Distance (Feet): 120 Feet Assistive device: Rolling walker (2 wheeled) Gait Pattern/deviations: Trunk flexed;Decreased stride length;Step-through pattern Gait velocity:  decreased Gait velocity interpretation: <1.8 ft/sec, indicate of risk for recurrent falls General Gait Details: Pt with slow cadence and trunk flexed posture throughout  Stairs            Wheelchair Mobility    Modified Rankin (Stroke Patients Only)       Balance Overall balance assessment: Needs assistance Sitting-balance support: Feet supported Sitting balance-Leahy Scale: Good     Standing balance support: Bilateral upper extremity supported Standing balance-Leahy Scale: Fair                               Pertinent Vitals/Pain Pain Assessment: No/denies pain    Home Living Family/patient expects to be discharged to:: Private residence Living Arrangements: Alone Available Help at Discharge: Family;Available PRN/intermittently(son, sister) Type of Home: House Home Access: Ramped entrance     Home Layout: One level Home Equipment: [place] - 2 wheels;Walker - 4 wheels;Cane - single point;Shower seat Additional Comments: pt son visits her daily    Prior Function Level of Independence: Independent with assistive device(s)         Comments: Independent with ADL's/IADL's. Uses RW for outdoor ambulation, Rollator for indoor ambulation, and cane for nightly trips to the bathroom. 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."     Hand Dominance        Extremity/Trunk Assessment   Upper Extremity Assessment Upper Extremity Assessment: Overall WFL for tasks assessed    Lower Extremity Assessment Lower Extremity Assessment: Overall WFL for tasks assessed    Cervical / Trunk Assessment Cervical / Trunk Assessment: Other exceptions Cervical / Trunk Exceptions: increased body habitus  Communication   Communication: No difficulties  Cognition Arousal/Alertness: Awake/alert Behavior During Therapy: WFL for tasks assessed/performed Overall Cognitive Status: Within Functional Limits for tasks assessed                                         General Comments      Exercises     Assessment/Plan    PT Assessment Patient needs continued PT services  PT Problem List Decreased activity tolerance;Decreased balance;Decreased mobility       PT Treatment Interventions DME instruction;Gait training;Stair training;Functional mobility training;Therapeutic activities;Therapeutic exercise;Balance training;Patient/family education    PT Goals (Current goals can be found in the Care Plan section)  Acute Rehab PT Goals Patient Stated Goal: "take care of myself." PT Goal Formulation: With patient Time For Goal Achievement: 12/16/18 Potential to Achieve Goals: Good    Frequency Min 3X/week   Barriers to discharge        Co-evaluation               AM-PAC PT "6 Clicks" Mobility  Outcome Measure Help needed turning from your back to your side while in a flat bed without using bedrails?: None Help needed moving from lying on your back to sitting on the side of a flat bed without using bedrails?: None Help needed moving to and from a bed to a chair (including a wheelchair)?: None Help needed standing up from a chair using your arms (e.g., wheelchair or bedside chair)?: None Help needed to walk in hospital room?: None Help needed climbing 3-5 steps with a railing? : A Lot 6 Click Score: 22    End of Session Equipment Utilized During Treatment: Oxygen Activity Tolerance: Patient tolerated treatment well Patient left: in bed;with call bell/phone within reach Nurse Communication: Mobility status PT Visit Diagnosis: Unsteadiness on feet (R26.81);Difficulty in walking, not elsewhere classified (R26.2)    Time: 3009-2330 PT Time Calculation (min) (ACUTE ONLY): 17 min   Charges:   PT Evaluation $PT Eval Moderate Complexity: 1 Mod         Ellamae Sia, [place], DPT Acute Rehabilitation Services Pager [phone] Office [phone]  Willy Eddy 12/02/2018, 8:48 AM

## 2018-12-02 NOTE — Progress Notes (Signed)
Pt provided with verbal d/c instruction. Paper copy of AVS provided. Son at beside during d/c. No further questions. All patient belongings sent with patient. Pt VSS at d/c. IV removed per order. Pt d/c by wheelchair through front entrance by private vehicle.

## 2018-12-02 NOTE — Care Management Note (Addendum)
Case Management Note  Patient Details  Name: Stacey [name] MRN: [id number] Date of Birth: 1946-03-20  Subjective/Objective:  Pt admitted with A fib RVR                  Action/Plan:  PTA independent from home, pt has cane in the home.  Pt educated on importance of daily weights and low sodium diet. Pt has prescription insurance and active relationship with her PCP.  CM contacted pharmacy of choice - Eliquis medication does not require pre auth however copay is at cost $480 (deductable not yet met for this year).  CM provided free 30 day card to pt, pharmacy confirmed they accept card.  Pt informed CM that she can not afford to pay the $480 post initial free inventory.  CM requested pt to contact the assistance program however pt adamantly declined and informed CM  "'Im not applying for any assistance because they will want to know all my business".   CM text paged attending PA and bedside nurse followed up with cardiology group to inform of barrier with ongoing Eliquis.  Pt informed CM that she will follow up with her "own" cardiologist in [place] within a week.  CM requested pt to provide a copy of the discharge paperwork to cardiology and inform of barrier with paying for medication post 30 initial period   Expected Discharge Date:  12/02/18               Expected Discharge Plan:  Home/Self Care  In-House Referral:     Discharge planning Services  CM Consult, Medication Assistance  Post Acute Care Choice:    Choice offered to:     DME Arranged:    DME Agency:     HH Arranged:    HH Agency:     Status of Service:  Completed, signed off  If discussed at [employer], dates discussed:    Additional Comments:  Maryclare Labrador, RN 12/02/2018, 4:58 PM

## 2018-12-02 NOTE — Progress Notes (Addendum)
Patient being sent home on Eliquis.  Patient provided with 30 day card. Per CM patient copy after 30 days supply complete $480 due to deductible not yet met for the year.  Text paged Attending Dr. Karleen Hampshire about patient barrier to get medication.   Paged returned @ 1715 advised patient to followup with her own cardiologist Dr. Chryl Heck -[facility]) in 1 week; patient to let cardiologist know so medications can be adjusted.  Text paged Dr. Harrell Gave about barrier for medication. Paged returned @ 1728 advise patient to see her cardiologist Dr. Chryl Heck in [place]; 30 day supply would be sufficient until she can follow-up with her cardiologist to change/adjust medication.  1750 Going over discharge instructions patient advises want to s/w MD about becoming a new patient with cardiologist her in [place] or [place]. Text paged Dr. Harrell Gave.  Page returned @ 1755 advised Dr. Harrell Gave of patients request to see cardiologist closer to home. Advise would put in request for callback due to scheduler being gone for the day. Patient will be contacted by schedule to setup an appointment with cardiologist. Advise patient that scheduler would call tommorow to setup appointment.

## 2018-12-03 NOTE — Discharge Summary (Signed)
Physician Discharge Summary  Stacey [name] XBJ:[id number] DOB: Feb 23, 1946 DOA: 11/30/2018  PCP: Glenda Chroman, MD  Admit date: 11/30/2018 Discharge date: 12/02/2018  Admitted From: Home.  Disposition:  Home  Recommendations for Outpatient Follow-up:  1. Follow up with PCP in 1-2 weeks 2. Please obtain BMP/CBC in one week 3. Please follow up with cardiology in one week.  4. We have started you on the Eliquis, recommend to check CBC in one week.   Discharge Condition:stable.  CODE STATUS: full code.  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 She was found to be in atrial fibrillation and in acute CHF.   Discharge Diagnoses:  Active Problems:   Atrial fibrillation with rapid ventricular response (HCC)   Atrial fibrillation with RVR:  Secondary to acute CHF.  - rate better , on metoprolol 100 mg daily.   - echocardiogram reviewed, abnormal, compared to echo from 10/19. Cardiology consulted, recommended starting NOAC, . She was discharged on Eliquis to complete the course.  - trend troponin's.  - TSH wnl.    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 Discharged her on oral lasix 80 mg daily.  Cardiology consulted and recommendations.    Ischemic cardiomyopathy:  Echo reviewed and cardiology consulted.  Continue with  plavix, statin, Losartan and BB.     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  Hypertension:  Well controlled.      Discharge  Instructions  Discharge Instructions    Diet - low sodium heart healthy   Complete by:  As directed    Discharge instructions   Complete by:  As directed    Please follow up with cardiology in one week.  Please check BMP in one week at cardiology office.     Allergies as of 12/02/2018      Reactions   Brilinta [ticagrelor] Other (See Comments)   Nausea and severe diarrhea- general weakness. Patient says does not want to take again   Albuterol Other (See Comments)   Heart racing      Medication List    STOP taking these medications   aspirin 81 MG chewable tablet     TAKE these medications   apixaban 5 MG Tabs tablet Commonly known as:  ELIQUIS Take 1 tablet (5 mg total) by mouth 2 (two) times daily.   atorvastatin 80 MG tablet Commonly known as:  LIPITOR Take 80 mg by mouth every morning.   b complex vitamins tablet Take 1 tablet by mouth every morning.   budesonide 0.25 MG/2ML nebulizer solution Commonly known as:  PULMICORT Take 2 mLs (0.25 mg total) by nebulization 2 (two) times daily.   CITRACAL MAXIMUM 315-250 MG-UNIT Tabs Generic drug:  Calcium Citrate-Vitamin D Take 2 tablets by mouth 2 (two) times daily.   clopidogrel 75 MG tablet Commonly known as:  PLAVIX Take 1 tablet (75 mg total) by mouth daily. What changed:  when to take this   cyclobenzaprine 10 MG tablet Commonly known as:  FLEXERIL Take 10 mg by mouth 3 (three) times daily as  needed for muscle spasms.   EQ VISION FORMULA 50+ PO Take 1 tablet by mouth every morning.   HAIR/SKIN/NAILS Caps Take 1 capsule by mouth every morning.   furosemide 80 MG tablet Commonly known as:  LASIX Take 1 tablet (80 mg total) by mouth daily. What changed:    medication strength  how much to take   gabapentin 300 MG capsule Commonly known as:  NEURONTIN Take 300 mg by mouth every morning.   gabapentin 400 MG capsule Commonly known as:  NEURONTIN Take 800 mg by mouth at bedtime.   ipratropium 0.02  % nebulizer solution Commonly known as:  ATROVENT Take 2.5 mLs (0.5 mg total) by nebulization every 12 (twelve) hours.   letrozole 2.5 MG tablet Commonly known as:  FEMARA Take 2.5 mg by mouth at bedtime.   levalbuterol 0.63 MG/3ML nebulizer solution Commonly known as:  XOPENEX Take 3 mLs (0.63 mg total) by nebulization every 8 (eight) hours as needed for wheezing or shortness of breath.   levalbuterol 45 MCG/ACT inhaler Commonly known as:  XOPENEX HFA Inhale 2 puffs into the lungs every 8 (eight) hours as needed for wheezing or shortness of breath.   losartan 50 MG tablet Commonly known as:  COZAAR Take 2 tablets (100 mg total) by mouth every morning.   metoprolol succinate 100 MG 24 hr tablet Commonly known as:  TOPROL-XL Take 1 tablet (100 mg total) by mouth daily. Take with or immediately following a meal. What changed:    medication strength  how much to take  when to take this  additional instructions   nitroGLYCERIN 0.4 MG SL tablet Commonly known as:  NITROSTAT Place 1 tablet (0.4 mg total) under the tongue every 5 (five) minutes as needed. What changed:  reasons to take this   Oxycodone HCl 10 MG Tabs Take 10 mg by mouth every 6 (six) hours as needed (for pain).   Potassium 99 MG Tabs Take 1 tablet by mouth every morning.   QUNOL COQ10/UBIQUINOL/MEGA 100 MG Caps Generic drug:  Ubiquinol Take 1 capsule by mouth every morning. QUNOL-Ultra COQ10      Follow-up Information    Vyas, Dhruv B, MD. Schedule an appointment as soon as possible for a visit in 1 week(s).   Specialty:  Internal Medicine Contact information: [place] [address] [phone]        Vergia Alcon, MD. Schedule an appointment as soon as possible for a visit in 1 week(s).   Specialty:  Cardiology Contact information: [place] [address] [phone]          Allergies  Allergen Reactions  . Brilinta [Ticagrelor] Other (See  Comments)    Nausea and severe diarrhea- general weakness. Patient says does not want to take again  . Albuterol Other (See Comments)    Heart racing     Consultations:  Cardiology.    Procedures/Studies: Ct Head Wo Contrast  Result Date: 11/13/2018 CLINICAL DATA:  Head trauma, minor, GCS>=13, high clinical risk, initial exam. Headache after fall yesterday striking head on night table after getting up too fast. EXAM: CT HEAD WITHOUT CONTRAST TECHNIQUE: Contiguous axial images were obtained from the base of the skull through the vertex without intravenous contrast. COMPARISON:  None. FINDINGS: Brain: Tiny lacunar infarct in the right caudate. No intracranial hemorrhage, mass effect, or midline shift. No hydrocephalus. The basilar cisterns are patent. No evidence of territorial infarct or acute ischemia. No extra-axial or intracranial fluid  collection. Vascular: Atherosclerosis of skullbase vasculature without hyperdense vessel or abnormal calcification. Skull: No fracture or focal lesion. Sinuses/Orbits: Paranasal sinuses and mastoid air cells are clear. The visualized orbits are unremarkable. Other: None. IMPRESSION: No acute intracranial abnormality. No skull fracture. Electronically Signed   By: Keith Rake M.D.   On: 11/13/2018 23:53   Dg Chest Port 1 View  Result Date: 11/30/2018 CLINICAL DATA:  Shortness of breath EXAM: PORTABLE CHEST 1 VIEW COMPARISON:  10/06/2018 FINDINGS: Small pleural effusions. Cardiomegaly with vascular congestion and mild diffuse interstitial edema. Patchy atelectasis or infiltrate at the left base. Aortic atherosclerosis. No pneumothorax. IMPRESSION: 1. Cardiomegaly with vascular congestion and mild interstitial pulmonary edema and small pleural effusions 2. Patchy atelectasis or infiltrate at the left base Electronically Signed   By: Donavan Foil M.D.   On: 11/30/2018 19:23       Subjective: No chest pain or sob.   Discharge Exam: Vitals:    12/02/18 1400 12/02/18 1505  BP:  131/69  Pulse: 89 95  Resp: 15 (!) 24  Temp:  98.4 F (36.9 C)  SpO2: 96% 92%   Vitals:   12/02/18 1107 12/02/18 1123 12/02/18 1400 12/02/18 1505  BP: 134/76 121/63  131/69  Pulse: 93 90 89 95  Resp: (!) 24  15 (!) 24  Temp: 98.6 F (37 C)   98.4 F (36.9 C)  TempSrc: Oral   Oral  SpO2: 99%  96% 92%  Weight:      Height:        General: Pt is alert, awake, not in acute distress Cardiovascular: RRR, S1/S2 +, no rubs, no gallops Respiratory: CTA bilaterally, no wheezing, no rhonchi Abdominal: Soft, NT, ND, bowel sounds + Extremities: no edema, no cyanosis    The results of significant diagnostics from this hospitalization (including imaging, microbiology, ancillary and laboratory) are listed below for reference.     Microbiology: Recent Results (from the past 240 hour(s))  MRSA PCR Screening     Status: None   Collection Time: 12/01/18 12:56 AM  Result Value Ref Range Status   MRSA by PCR NEGATIVE NEGATIVE Final    Comment:        The GeneXpert MRSA Assay (FDA approved for NASAL specimens only), is one component of a comprehensive MRSA colonization surveillance program. It is not intended to diagnose MRSA infection nor to guide or monitor treatment for MRSA infections. Performed at [facility], [place] [address] [place],  [address]      Labs: BNP (last 3 results) Recent Labs    09/19/18 1204 10/06/18 2049 11/30/18 1857  BNP 368.0* 258.0* 194.1*   Basic Metabolic Panel: Recent Labs  Lab 11/30/18 1857 11/30/18 2358 12/01/18 0541 12/02/18 0234  NA 143  --  142 141  K 4.1  --  4.4 3.9  CL 108  --  103 97*  CO2 24  --  31 33*  GLUCOSE 194*  --  109* 112*  BUN 15  --  19 24*  CREATININE 0.79  --  0.88 0.96  CALCIUM 8.9  --  8.7* 8.5*  MG  --  2.1 2.0  --    Liver Function Tests: No results for input(s): AST, ALT, ALKPHOS, BILITOT, PROT, ALBUMIN in the last 168 hours. No results for input(s): LIPASE,  AMYLASE in the last 168 hours. No results for input(s): AMMONIA in the last 168 hours. CBC: Recent Labs  Lab 11/30/18 1857  WBC 8.5  HGB 12.3  HCT  40.7  MCV 101.8*  PLT 186   Cardiac Enzymes: Recent Labs  Lab 11/30/18 1940 11/30/18 2358 12/01/18 0541 12/01/18 1129  TROPONINI <0.03 0.04* 0.03* 0.06*   BNP: Invalid input(s): POCBNP CBG: No results for input(s): GLUCAP in the last 168 hours. D-Dimer No results for input(s): DDIMER in the last 72 hours. Hgb A1c No results for input(s): HGBA1C in the last 72 hours. Lipid Profile No results for input(s): CHOL, HDL, LDLCALC, TRIG, CHOLHDL, LDLDIRECT in the last 72 hours. Thyroid function studies Recent Labs    11/30/18 2358  TSH 1.434   Anemia work up No results for input(s): VITAMINB12, FOLATE, FERRITIN, TIBC, IRON, RETICCTPCT in the last 72 hours. Urinalysis    Component Value Date/Time   COLORURINE YELLOW 11/14/2018 0124   APPEARANCEUR CLEAR 11/14/2018 0124   LABSPEC 1.017 11/14/2018 0124   PHURINE 5.0 11/14/2018 0124   GLUCOSEU NEGATIVE 11/14/2018 0124   HGBUR NEGATIVE 11/14/2018 0124   BILIRUBINUR NEGATIVE 11/14/2018 0124   KETONESUR NEGATIVE 11/14/2018 0124   PROTEINUR NEGATIVE 11/14/2018 0124   NITRITE NEGATIVE 11/14/2018 0124   LEUKOCYTESUR NEGATIVE 11/14/2018 0124   Sepsis Labs Invalid input(s): PROCALCITONIN,  WBC,  LACTICIDVEN Microbiology Recent Results (from the past 240 hour(s))  MRSA PCR Screening     Status: None   Collection Time: 12/01/18 12:56 AM  Result Value Ref Range Status   MRSA by PCR NEGATIVE NEGATIVE Final    Comment:        The GeneXpert MRSA Assay (FDA approved for NASAL specimens only), is one component of a comprehensive MRSA colonization surveillance program. It is not intended to diagnose MRSA infection nor to guide or monitor treatment for MRSA infections. Performed at [facility], [place] [address] [place], [place] [address]      Time coordinating discharge:  35 minutes  SIGNED:   Hosie Poisson, MD  [facility] 12/03/2018, 4:08 PM Pager   If 7PM-7AM, please contact night-coverage [email or web address] Password [email or web address]

## 2018-12-13 ENCOUNTER — Other Ambulatory Visit (HOSPITAL_BASED_OUTPATIENT_CLINIC_OR_DEPARTMENT_OTHER): Payer: Self-pay

## 2018-12-13 DIAGNOSIS — G4730 Sleep apnea, unspecified: Secondary | ICD-10-CM

## 2018-12-14 ENCOUNTER — Encounter: Payer: Self-pay | Admitting: *Deleted

## 2018-12-14 ENCOUNTER — Ambulatory Visit (INDEPENDENT_AMBULATORY_CARE_PROVIDER_SITE_OTHER): Payer: Medicare Other | Admitting: Cardiology

## 2018-12-14 VITALS — BP 120/70 | HR 73 | Ht 60.0 in | Wt 248.0 lb

## 2018-12-14 DIAGNOSIS — I2510 Atherosclerotic heart disease of native coronary artery without angina pectoris: Secondary | ICD-10-CM

## 2018-12-14 DIAGNOSIS — I480 Paroxysmal atrial fibrillation: Secondary | ICD-10-CM | POA: Diagnosis not present

## 2018-12-14 DIAGNOSIS — I5022 Chronic systolic (congestive) heart failure: Secondary | ICD-10-CM | POA: Diagnosis not present

## 2018-12-14 MED ORDER — SPIRONOLACTONE 25 MG PO TABS: 12.5000 mg | ORAL_TABLET | Freq: Every day | ORAL | 1 refills | Status: DC

## 2018-12-14 NOTE — Progress Notes (Signed)
Clinical Summary Stacey [name] is a 73 y.o.female seen for hospital follow up, previously followed by Dr Chryl Heck at [place].   1. Chronic systolic HF/ICM - Jan 7672 echo LVEF 20-25%, restrictive diastolic function, mild AS, PASP 40  - 10/2017 echo LVEF 50%, dropped to 30-35% Jan 2019 and has remained decreased  - chronic left leg edema thought to be venous insufficiency  From prior cardiologist: "She is not a candidate for device therapy given her metastatic cancer and numerous comorbidities and poor long-term prognosis. She has been scheduled to establish care with the palliative care team which I think is quite appropriate".   - no recent edema. No recent SOB/DOE.    2. CAD - noted incidate history of DES to ramus, late stent thrombosis requiring PTCA in 2006. DES to RCA in Dec 2018 - SOB on brillinta, previously changed to plavix.   - denies any chest pain  3. Mild aortic stenosis - Jan 2020 echo mean grad 11, AVA VTI 1.69  4. Chronic LBBB   5. . Metastatic breast cancer - followed at [facility] Metastatic breast cancer to the bone and renal lesion, has pulmonary nodules. On radiation. - has been seen by palliative care previousl  6. Afib - noted during Jan 2020 admission, new diagnosis - managed with Toprol, started on eliquis. NO evidence of brain mets by 10/2018 CT.   - eliquis is too expensive - wants to transition to coumadin.    7. COPD - upcoming PFTs with pulmonary  8. Sleep apnea screen - upcoming test.   Past Medical History:  Diagnosis Date  . CAD in native artery    a.DES to ramus 2005 with late stent thrombosis 2006 tx with PTCA. 12/18 PCI/DES x1 to mRCA, EF 50-55%  . CHF (congestive heart failure) ([place])   . Chronic pain   . COPD (chronic obstructive pulmonary disease) ([place])   . Hyperlipidemia   . Hypertension   . LBBB (left bundle Stacey [name] block)   . Left bundle Melodee Lupe block   . Lymphedema   . Metastatic breast cancer ([place])    a. to bone.  . MI  (myocardial infarction) ([place])   . Mild aortic stenosis 10/2017  . Morbid obesity ([place])   . PSVT (paroxysmal supraventricular tachycardia) ([place])    a. per [facility] notes, seen on event monitor in 2014.  . Pulmonary nodules      Allergies  Allergen Reactions  . Brilinta [Ticagrelor] Other (See Comments)    Nausea and severe diarrhea- general weakness. Patient says does not want to take again  . Albuterol Other (See Comments)    Heart racing      Current Outpatient Medications  Medication Sig Dispense Refill  . apixaban (ELIQUIS) 5 MG TABS tablet Take 1 tablet (5 mg total) by mouth 2 (two) times daily. 60 tablet 1  . atorvastatin (LIPITOR) 80 MG tablet Take 80 mg by mouth every morning.     Marland Kitchen b complex vitamins tablet Take 1 tablet by mouth every morning.     . budesonide (PULMICORT) 0.25 MG/2ML nebulizer solution Take 2 mLs (0.25 mg total) by nebulization 2 (two) times daily. 120 mL 11  . Calcium Citrate-Vitamin D (CITRACAL MAXIMUM) 315-250 MG-UNIT TABS Take 2 tablets by mouth 2 (two) times daily.    . clopidogrel (PLAVIX) 75 MG tablet Take 1 tablet (75 mg total) by mouth daily. (Patient taking differently: Take 75 mg by mouth every morning. ) 90 tablet 4  . cyclobenzaprine (FLEXERIL)  10 MG tablet Take 10 mg by mouth 3 (three) times daily as needed for muscle spasms.    . furosemide (LASIX) 80 MG tablet Take 1 tablet (80 mg total) by mouth daily. 30 tablet 0  . gabapentin (NEURONTIN) 300 MG capsule Take 300 mg by mouth every morning.    . gabapentin (NEURONTIN) 400 MG capsule Take 800 mg by mouth at bedtime.    Marland Kitchen ipratropium (ATROVENT) 0.02 % nebulizer solution Take 2.5 mLs (0.5 mg total) by nebulization every 12 (twelve) hours. (Patient not taking: Reported on 11/30/2018) 75 mL 3  . letrozole (FEMARA) 2.5 MG tablet Take 2.5 mg by mouth at bedtime.     . levalbuterol (XOPENEX HFA) 45 MCG/ACT inhaler Inhale 2 puffs into the lungs every 8 (eight) hours as needed for wheezing or shortness of  breath. 1 Inhaler 3  . levalbuterol (XOPENEX) 0.63 MG/3ML nebulizer solution Take 3 mLs (0.63 mg total) by nebulization every 8 (eight) hours as needed for wheezing or shortness of breath. 3 mL 3  . losartan (COZAAR) 50 MG tablet Take 2 tablets (100 mg total) by mouth every morning.    . metoprolol succinate (TOPROL-XL) 100 MG 24 hr tablet Take 1 tablet (100 mg total) by mouth daily. Take with or immediately following a meal. 30 tablet 0  . Multiple Vitamins-Minerals (EQ VISION FORMULA 50+ PO) Take 1 tablet by mouth every morning.    . Multiple Vitamins-Minerals (HAIR/SKIN/NAILS) CAPS Take 1 capsule by mouth every morning.    . nitroGLYCERIN (NITROSTAT) 0.4 MG SL tablet Place 1 tablet (0.4 mg total) under the tongue every 5 (five) minutes as needed. (Patient taking differently: Place 0.4 mg under the tongue every 5 (five) minutes as needed for chest pain. ) 25 tablet 0  . Oxycodone HCl 10 MG TABS Take 10 mg by mouth every 6 (six) hours as needed (for pain).    . Potassium 99 MG TABS Take 1 tablet by mouth every morning.    Marland Kitchen Ubiquinol (QUNOL COQ10/UBIQUINOL/MEGA) 100 MG CAPS Take 1 capsule by mouth every morning. QUNOL-Ultra COQ10     No current facility-administered medications for this visit.      Past Surgical History:  Procedure Laterality Date  . CORONARY STENT INTERVENTION N/A 11/26/2017   Procedure: CORONARY STENT INTERVENTION;  Surgeon: Martinique, Peter M, MD;  Location: [facility];  Service: Cardiovascular;  Laterality: N/A;  . CORONARY STENT PLACEMENT    . LEFT HEART CATH AND CORONARY ANGIOGRAPHY N/A 11/26/2017   Procedure: LEFT HEART CATH AND CORONARY ANGIOGRAPHY;  Surgeon: Martinique, Peter M, MD;  Location: [facility];  Service: Cardiovascular;  Laterality: N/A;     Allergies  Allergen Reactions  . Brilinta [Ticagrelor] Other (See Comments)    Nausea and severe diarrhea- general weakness. Patient says does not want to take again  . Albuterol Other (See Comments)     Heart racing       Family History  Problem Relation Age of Onset  . CAD Father 67  . Heart attack Father   . COPD Sister   . CAD Paternal Grandmother   . Sudden Cardiac Death Neg Hx      Social History Stacey [name] reports that she quit smoking about 38 years ago. Her smoking use included cigarettes. She has a 36.00 pack-year smoking history. She has never used smokeless tobacco. Stacey [name] reports no history of alcohol use.   Review of Systems CONSTITUTIONAL: No weight loss, fever, chills, weakness or fatigue.  HEENT: Eyes: No visual loss, blurred vision, double vision or yellow sclerae.No hearing loss, sneezing, congestion, runny nose or sore throat.  SKIN: No rash or itching.  CARDIOVASCULAR: per hpi RESPIRATORY: No shortness of breath, cough or sputum.  GASTROINTESTINAL: No anorexia, nausea, vomiting or diarrhea. No abdominal pain or blood.  GENITOURINARY: No burning on urination, no polyuria NEUROLOGICAL: No headache, dizziness, syncope, paralysis, ataxia, numbness or tingling in the extremities. No change in bowel or bladder control.  MUSCULOSKELETAL: No muscle, back pain, joint pain or stiffness.  LYMPHATICS: No enlarged nodes. No history of splenectomy.  PSYCHIATRIC: No history of depression or anxiety.  ENDOCRINOLOGIC: No reports of sweating, cold or heat intolerance. No polyuria or polydipsia.  Marland Kitchen   Physical Examination Vitals:   12/14/18 1255  BP: 120/70  Pulse: 73  SpO2: 99%   Vitals:   12/14/18 1255  Weight: 248 lb (112.5 kg)  Height: 5' (1.524 m)    Gen: resting comfortably, no acute distress HEENT: no scleral icterus, pupils equal round and reactive, no palptable cervical adenopathy,  CV: RRR, no m/r/g, no jvd Resp: Clear to auscultation bilaterally GI: abdomen is soft, non-tender, non-distended, normal bowel sounds, no hepatosplenomegaly MSK: extremities are warm, no edema.  Skin: warm, no rash Neuro:  no focal deficits Psych: appropriate  affect   Diagnostic Studies  10/2017 cath  Ost Ramus to Ramus lesion is 100% stenosed.  Mid RCA lesion is 90% stenosed.  A drug-eluting stent was successfully placed using a STENT SYNERGY DES 2.5X16.  Post intervention, there is a 0% residual stenosis.  LV end diastolic pressure is normal.   1. 2 vessel obstructive CAD    - 100% proximal ramus intermediate at site of prior stent. I think this is a CTO and she has good collaterals.     - 90% ulcerative mid RCA 2. Normal LVEDP 3. Successful PCI of the mid RCA with DES   10/2017 echo Study Conclusions  - Left ventricle: The cavity size was normal. There was mild   concentric hypertrophy. Systolic function was normal. The   estimated ejection fraction was in the range of 50% to 55%. There   was an increased relative contribution of atrial contraction to   ventricular filling. Doppler parameters are consistent with   abnormal left ventricular relaxation (grade 1 diastolic   dysfunction). - Aortic valve: There was mild stenosis. Valve area (VTI): 1.66   cm^2. Valve area (Vmax): 1.71 cm^2. Valve area (Vmean): 1.62   cm^2. - Mitral valve: Calcified annulus. Mildly thickened leaflets . - Pulmonary arteries: PA peak pressure: 32 mm Hg (S).  Jan 2019 echo Study Conclusions  - Left ventricle: The cavity size was normal. There was mild   concentric hypertrophy. Systolic function was moderately to   severely reduced. The estimated ejection fraction was in the   range of 30% to 35%. Diffuse hypokinesis. Doppler parameters are   consistent with abnormal left ventricular relaxation (grade 1   diastolic dysfunction). - Aortic valve: Probably trileaflet; moderately thickened, severely   calcified leaflets. There was mild stenosis. Peak gradient (S):   13 mm Hg. Valve area (Vmax): 1.26 cm^2. - Mitral valve: Calcified annulus. Mildly thickened leaflets .   There was mild regurgitation. - Right ventricle: The cavity size was normal.  Wall thickness was   normal. Systolic function was normal. - Right atrium: The atrium was normal in size. - Pulmonary arteries: Systolic pressure could not be accurately   estimated. - Inferior vena cava: The vessel  was normal in size. - Pericardium, extracardiac: There was no pericardial effusion.  Impressions:  - Since the last study on 11/24/17 LVEF has decreased from 50% to   30-35% with diffuse hypokinesis.  Jan 2020 echo Study Conclusions  - Left ventricle: The cavity size was normal. Wall thickness was   normal. Systolic function was severely reduced. The estimated   ejection fraction was in the range of 20% to 25%. Diffuse   hypokinesis. Doppler parameters are consistent with restrictive   physiology, indicative of decreased left ventricular diastolic   compliance and/or increased left atrial pressure. Doppler   parameters are consistent with high ventricular filling pressure. - Aortic valve: There was mild stenosis. Valve area (VTI): 1.69   cm^2. Valve area (Vmax): 1.73 cm^2. Valve area (Vmean): 1.74   cm^2. - Mitral valve: Calcified annulus. There was mild regurgitation. - Left atrium: The atrium was mildly dilated. - Atrial septum: No defect or patent foramen ovale was identified. - Tricuspid valve: There was mild regurgitation. - Pulmonary arteries: PA peak pressure: 40 mm Hg (S).    Assessment and Plan  1. Chronic systolic HF - appears euvoelmic, no significant symptoms - we will add aldactone 12.5mg  daily, check BMET 2 weeks. She is concerned about cost with entresto, will not change at this time - appears repeat cath not pursued after her LVEF drop due to her metastatic cancer. Follow progress of this as well as her CHF with medical therapy, may reconsider pending changes  2. PAF - ekg today shows she is back in SR - continue rate control with Toprol - DOACs too expensive, we will have her set up in coumadin in clinic and stop eliquis once  established  3. CAD - no recent symptoms - history of late stent thrombosis, continue plavix.    F/u 1 month. Titrate beta blocker further possibly    Arnoldo Lenis, M.D.

## 2018-12-14 NOTE — Patient Instructions (Signed)
Your physician recommends that you schedule a follow-up appointment in: [place]  Your physician has recommended you make the following change in your medication:   START ALDACTONE 12.5 MG (1/2 TABLET) DAILY   Your physician recommends that you return for lab work in: 2 WEEKS BMP   Thank you for choosing [facility]!!

## 2018-12-19 ENCOUNTER — Ambulatory Visit: Payer: Medicare Other | Attending: Pulmonary Disease | Admitting: Neurology

## 2018-12-19 DIAGNOSIS — G4761 Periodic limb movement disorder: Secondary | ICD-10-CM | POA: Diagnosis not present

## 2018-12-19 DIAGNOSIS — G4733 Obstructive sleep apnea (adult) (pediatric): Secondary | ICD-10-CM | POA: Insufficient documentation

## 2018-12-19 DIAGNOSIS — G4730 Sleep apnea, unspecified: Secondary | ICD-10-CM

## 2018-12-28 ENCOUNTER — Other Ambulatory Visit: Payer: Self-pay | Admitting: Cardiology

## 2018-12-28 ENCOUNTER — Ambulatory Visit (INDEPENDENT_AMBULATORY_CARE_PROVIDER_SITE_OTHER): Payer: Medicare Other | Admitting: Pharmacist

## 2018-12-28 DIAGNOSIS — Z5181 Encounter for therapeutic drug level monitoring: Secondary | ICD-10-CM | POA: Diagnosis not present

## 2018-12-28 DIAGNOSIS — I4891 Unspecified atrial fibrillation: Secondary | ICD-10-CM

## 2018-12-28 MED ORDER — WARFARIN SODIUM 5 MG PO TABS: 5.0000 mg | ORAL_TABLET | Freq: Every day | ORAL | 0 refills | Status: DC

## 2018-12-28 NOTE — Procedures (Signed)
Richardson A. Merlene Laughter, MD     [email or web address]             NOCTURNAL POLYSOMNOGRAPHY   LOCATION: [facility]   Patient Name: Stacey [name], Stacey [name] Date: 12/19/2018 Gender: Female D.O.B: May 09, 1946 Age (years): 89 Referring Provider: Sinda Du Height (inches): 60 Interpreting Physician: Phillips Odor MD, ABSM Weight (lbs): 248 RPSGT: Rosebud Poles BMI: 48 MRN: [id number] Neck Size: 16.00 CLINICAL INFORMATION Sleep Study Type: NPSG     Indication for sleep study: N/A     Epworth Sleepiness Score: 3     SLEEP STUDY TECHNIQUE As per the AASM Manual for the Scoring of Sleep and Associated Events v2.3 (April 2016) with a hypopnea requiring 4% desaturations.  The channels recorded and monitored were frontal, central and occipital EEG, electrooculogram (EOG), submentalis EMG (chin), nasal and oral airflow, thoracic and abdominal wall motion, anterior tibialis EMG, snore microphone, electrocardiogram, and pulse oximetry.  MEDICATIONS Medications self-administered by patient taken the night of the study : N/A  Current Outpatient Medications:  .  acetaminophen (TYLENOL) 500 MG tablet, Take 500 mg by mouth every 6 (six) hours as needed., Disp: , Rfl:  .  apixaban (ELIQUIS) 5 MG TABS tablet, Take 1 tablet (5 mg total) by mouth 2 (two) times daily., Disp: 60 tablet, Rfl: 1 .  atorvastatin (LIPITOR) 80 MG tablet, Take 80 mg by mouth every morning. , Disp: , Rfl:  .  b complex vitamins tablet, Take 1 tablet by mouth every morning. , Disp: , Rfl:  .  budesonide (PULMICORT) 0.25 MG/2ML nebulizer solution, Take 2 mLs (0.25 mg total) by nebulization 2 (two) times daily., Disp: 120 mL, Rfl: 11 .  Calcium Citrate-Vitamin D (CITRACAL MAXIMUM) 315-250 MG-UNIT TABS, Take 2 tablets by mouth 2 (two) times daily., Disp: , Rfl:  .  clopidogrel (PLAVIX) 75 MG tablet, Take 1 tablet (75 mg total) by mouth daily. (Patient taking differently: Take 75 mg by mouth  every morning. ), Disp: 90 tablet, Rfl: 4 .  dexamethasone (DECADRON) 2 MG tablet, Take 1.5 tablets by mouth 4 (four) times daily., Disp: , Rfl:  .  furosemide (LASIX) 80 MG tablet, Take 1 tablet (80 mg total) by mouth daily., Disp: 30 tablet, Rfl: 0 .  gabapentin (NEURONTIN) 300 MG capsule, Take 300 mg by mouth every morning., Disp: , Rfl:  .  gabapentin (NEURONTIN) 400 MG capsule, Take 800 mg by mouth at bedtime., Disp: , Rfl:  .  ipratropium (ATROVENT) 0.02 % nebulizer solution, Take 2.5 mLs (0.5 mg total) by nebulization every 12 (twelve) hours. (Patient not taking: Reported on 11/30/2018), Disp: 75 mL, Rfl: 3 .  letrozole (FEMARA) 2.5 MG tablet, Take 2.5 mg by mouth at bedtime. , Disp: , Rfl:  .  levalbuterol (XOPENEX) 0.63 MG/3ML nebulizer solution, Take 3 mLs (0.63 mg total) by nebulization every 8 (eight) hours as needed for wheezing or shortness of breath., Disp: 3 mL, Rfl: 3 .  losartan (COZAAR) 50 MG tablet, Take 2 tablets (100 mg total) by mouth every morning., Disp: , Rfl:  .  metoprolol succinate (TOPROL-XL) 100 MG 24 hr tablet, Take 1 tablet (100 mg total) by mouth daily. Take with or immediately following a meal., Disp: 30 tablet, Rfl: 0 .  Multiple Vitamins-Minerals (EQ VISION FORMULA 50+ PO), Take 1 tablet by mouth every morning., Disp: , Rfl:  .  Multiple Vitamins-Minerals (HAIR/SKIN/NAILS) CAPS, Take 1 capsule by mouth every morning., Disp: , Rfl:  .  nitroGLYCERIN (NITROSTAT) 0.4 MG SL tablet,  Place 1 tablet (0.4 mg total) under the tongue every 5 (five) minutes as needed. (Patient taking differently: Place 0.4 mg under the tongue every 5 (five) minutes as needed for chest pain. ), Disp: 25 tablet, Rfl: 0 .  Oxycodone HCl 10 MG TABS, Take 10 mg by mouth every 4 (four) hours as needed (for pain). , Disp: , Rfl:  .  Potassium 99 MG TABS, Take 2 tablets by mouth every morning. , Disp: , Rfl:  .  spironolactone (ALDACTONE) 25 MG tablet, Take 0.5 tablets (12.5 mg total) by mouth  daily., Disp: 45 tablet, Rfl: 1 .  Ubiquinol (QUNOL COQ10/UBIQUINOL/MEGA) 100 MG CAPS, Take 1 capsule by mouth every morning. QUNOL-Ultra COQ10, Disp: , Rfl:      SLEEP ARCHITECTURE The study was initiated at 11:10:19 PM and ended at 5:33:18 AM.  Sleep onset time was 11.3 minutes and the sleep efficiency was 82.3%%. The total sleep time was 315.2 minutes.  Stage REM latency was 146.0 minutes.  The patient spent 1.6%% of the night in stage N1 sleep, 33.4%% in stage N2 sleep, 50.1%% in stage N3 and 14.9% in REM.  Alpha intrusion was absent.  Supine sleep was 0.00%.  RESPIRATORY PARAMETERS The overall apnea/hypopnea index (AHI) was 9.7 per hour. There were 2 total apneas, including 2 obstructive, 0 central and 0 mixed apneas. There were 49 hypopneas and 1 RERAs.  The AHI during Stage REM sleep was 56.2 per hour.  AHI while supine was N/A per hour.  The mean oxygen saturation was 95.1%. The minimum SpO2 during sleep was 82.0%.  loud snoring was noted during this study.  CARDIAC DATA The 2 lead EKG demonstrated sinus rhythm. The mean heart rate was 73.9 beats per minute. Other EKG findings include: Atrial Fibrillation.   LEG MOVEMENT DATA Mild periodic limb movements are observed with visual inspection  IMPRESSIONS 1. 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  Consider formal CPAP titration study. 2. 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    Delano Metz, MD Diplomate, American Board of Sleep Medicine.  ELECTRONICALLY SIGNED ON:  12/28/2018, 10:18 AM [facility] PH: [phone]   FX: [phone] [place]

## 2018-12-28 NOTE — Patient Instructions (Addendum)
Description   Complete supply of Eliquis twice daily and Start taking warfarin (coumadin) 1 tablet daily. Recheck INR in 1 week     A full discussion of the nature of anticoagulants has been carried out.  A benefit risk analysis has been presented to the patient, so that they understand the justification for choosing anticoagulation at this time. The need for frequent and regular monitoring, precise dosage adjustment and compliance is stressed.  Side effects of potential bleeding are discussed.  The patient should avoid any OTC items containing aspirin or ibuprofen, and should avoid great swings in general diet.  Avoid alcohol consumption.  Call if any signs of abnormal bleeding.

## 2018-12-29 ENCOUNTER — Ambulatory Visit (HOSPITAL_COMMUNITY)
Admission: RE | Admit: 2018-12-29 | Discharge: 2018-12-29 | Disposition: A | Discharge status: Home / Self Care | Payer: Medicare Other | Source: Ambulatory Visit | Attending: Pulmonary Disease | Admitting: Pulmonary Disease

## 2018-12-29 DIAGNOSIS — J441 Chronic obstructive pulmonary disease with (acute) exacerbation: Secondary | ICD-10-CM | POA: Insufficient documentation

## 2018-12-29 LAB — PULMONARY FUNCTION TEST
DL/VA % pred: 77 %
DL/VA: 3.27 ml/min/mmHg/L
DLCO unc % pred: 60 %
DLCO unc: 11.35 ml/min/mmHg
FEF 25-75 PRE: 0.92 L/s
FEF 25-75 Post: 1.12 L/sec
FEF2575-%Change-Post: 21 %
FEF2575-%Pred-Post: 68 %
FEF2575-%Pred-Pre: 56 %
FEV1-%Change-Post: 4 %
FEV1-%Pred-Post: 72 %
FEV1-%Pred-Pre: 69 %
FEV1-Post: 1.35 L
FEV1-Pre: 1.29 L
FEV1FVC-%Change-Post: -2 %
FEV1FVC-%Pred-Pre: 98 %
FEV6-%CHANGE-POST: 6 %
FEV6-%Pred-Post: 77 %
FEV6-%Pred-Pre: 72 %
FEV6-Post: 1.83 L
FEV6-Pre: 1.71 L
FEV6FVC-%Change-Post: 0 %
FEV6FVC-%Pred-Post: 104 %
FEV6FVC-%Pred-Pre: 103 %
FVC-%Change-Post: 6 %
FVC-%Pred-Post: 74 %
FVC-%Pred-Pre: 69 %
FVC-Post: 1.85 L
FVC-Pre: 1.74 L
Post FEV1/FVC ratio: 73 %
Post FEV6/FVC ratio: 99 %
Pre FEV1/FVC ratio: 75 %
Pre FEV6/FVC Ratio: 99 %
RV % pred: 261 %
RV: 5.25 L
TLC % pred: 159 %
TLC: 7.1 L

## 2018-12-29 MED ORDER — LEVALBUTEROL HCL 1.25 MG/0.5ML IN NEBU
1.2500 mg | INHALATION_SOLUTION | Freq: Once | RESPIRATORY_TRACT | Status: AC
  Administered 2018-12-29: 1.25 mg via RESPIRATORY_TRACT
  Filled 2018-12-29 (×2): qty 0.5

## 2019-01-04 ENCOUNTER — Other Ambulatory Visit (HOSPITAL_COMMUNITY)
Admission: RE | Admit: 2019-01-04 | Discharge: 2019-01-04 | Disposition: A | Discharge status: Home / Self Care | Payer: Medicare Other | Source: Ambulatory Visit | Attending: Cardiology | Admitting: Cardiology

## 2019-01-04 ENCOUNTER — Telehealth: Payer: Self-pay | Admitting: *Deleted

## 2019-01-04 ENCOUNTER — Ambulatory Visit (INDEPENDENT_AMBULATORY_CARE_PROVIDER_SITE_OTHER): Payer: Medicare Other | Admitting: Pharmacist

## 2019-01-04 DIAGNOSIS — I4891 Unspecified atrial fibrillation: Secondary | ICD-10-CM | POA: Diagnosis not present

## 2019-01-04 DIAGNOSIS — Z5181 Encounter for therapeutic drug level monitoring: Secondary | ICD-10-CM

## 2019-01-04 LAB — PROTIME-INR
INR: 7.19
Prothrombin Time: 60.5 seconds — ABNORMAL HIGH (ref 11.4–15.2)

## 2019-01-04 LAB — POCT INR: INR: 8 — AB (ref 2.0–3.0)

## 2019-01-04 NOTE — Patient Instructions (Signed)
Description   Do not take any warfarin until we call you with the results of the lab. If you have any bleeding or bruising concerns please go to the ER to be evaluated.

## 2019-01-04 NOTE — Telephone Encounter (Signed)
Pt requesting a letter from Dr Harl Bowie to excuse her son (caregiver) from jury duty - son is pt only caregiver

## 2019-01-06 NOTE — Telephone Encounter (Signed)
I would only be able to provide a note for her personally for potential jury duty, from a medical standpoint I am not able to clear her son   Zandra Abts MD

## 2019-01-06 NOTE — Telephone Encounter (Signed)
Patient informed and verbalized understanding

## 2019-01-07 ENCOUNTER — Telehealth: Payer: Self-pay | Admitting: Cardiology

## 2019-01-07 NOTE — Telephone Encounter (Addendum)
LMOM to discuss. Pt should resume today with 1/2 tablet until recheck on Tuesday.   Per our discussion pt should not have any Eliquis left. She should not resume this.

## 2019-01-07 NOTE — Telephone Encounter (Signed)
Spoke with patient, who is tearful and says that she did not receive a call back on Tuesday. Advised that I did indeed call her with results on Thursday, she becomes more tearful.   She states she maybe remembers me calling, but does not have the sheet with her that she wrote down. She states she wrote down to return today to be checked. Her son is with her and states that they have a lot going on and he could also not recall our conversation on Tuesday. Advised to resume with 1/2 tablet daily starting today and come for appt on Tuesday to be rechecked. She states understanding.   Pt does seem very confused today. I inquired if she was ok and she states there is just a lot going on right now, but she is ok.

## 2019-01-07 NOTE — Telephone Encounter (Signed)
Patient walked in    Asking when she is suppose to start back taking her blood thinner. Said she has not taken Eliquis or her coumadin since she was told to stop

## 2019-01-11 ENCOUNTER — Ambulatory Visit (INDEPENDENT_AMBULATORY_CARE_PROVIDER_SITE_OTHER): Payer: Medicare Other | Admitting: Pharmacist

## 2019-01-11 ENCOUNTER — Other Ambulatory Visit: Payer: Self-pay | Admitting: Cardiology

## 2019-01-11 ENCOUNTER — Other Ambulatory Visit (HOSPITAL_COMMUNITY)
Admission: RE | Admit: 2019-01-11 | Discharge: 2019-01-11 | Disposition: A | Discharge status: Home / Self Care | Payer: Medicare Other | Source: Ambulatory Visit | Attending: Cardiovascular Disease | Admitting: Cardiovascular Disease

## 2019-01-11 DIAGNOSIS — I4891 Unspecified atrial fibrillation: Secondary | ICD-10-CM

## 2019-01-11 DIAGNOSIS — Z5181 Encounter for therapeutic drug level monitoring: Secondary | ICD-10-CM

## 2019-01-11 LAB — POCT INR: INR: 7.5 — AB (ref 2.0–3.0)

## 2019-01-11 LAB — PROTIME-INR
INR: 4.87
Prothrombin Time: 44.7 seconds — ABNORMAL HIGH (ref 11.4–15.2)

## 2019-01-11 MED ORDER — WARFARIN SODIUM 1 MG PO TABS: ORAL_TABLET | ORAL | 0 refills | Status: DC

## 2019-01-11 NOTE — Patient Instructions (Addendum)
Stat INR back at 4.87. Called pt and advised her to skip her warfarin today and tomorrow, then pick up new rx for warfarin 1mg  tablets and start taking 1 tablet daily except 2 tablets on Sundays. Recheck INR in 1 week.

## 2019-01-12 ENCOUNTER — Telehealth: Payer: Self-pay | Admitting: Cardiology

## 2019-01-12 NOTE — Telephone Encounter (Signed)
Patient called  She doesn't remember if someone called her back about when to start taking her warafrin

## 2019-01-12 NOTE — Telephone Encounter (Signed)
Returned a call to the pt and she states she can't remember anything. Gave the pt instructions again from her call yesterday. Pt did hold yesterday's dose as instructed. Also, she is hold today's dose then start taking 1mg  daily except 2mg  on Sundays and report to appt on 01/18/2019; went over instructions twice and pt wrote them down and then the pt read back the instructions to me over the phone & they were correct. Advised pt to only use 1mg  tabs since she did pick them up.

## 2019-01-18 ENCOUNTER — Ambulatory Visit (INDEPENDENT_AMBULATORY_CARE_PROVIDER_SITE_OTHER): Payer: Medicare Other | Admitting: Pharmacist

## 2019-01-18 ENCOUNTER — Other Ambulatory Visit: Payer: Self-pay | Admitting: Cardiology

## 2019-01-18 DIAGNOSIS — I4891 Unspecified atrial fibrillation: Secondary | ICD-10-CM | POA: Diagnosis not present

## 2019-01-18 DIAGNOSIS — Z5181 Encounter for therapeutic drug level monitoring: Secondary | ICD-10-CM

## 2019-01-18 LAB — POCT INR: INR: 4.9 — AB (ref 2.0–3.0)

## 2019-01-18 MED ORDER — METOPROLOL SUCCINATE ER 100 MG PO TB24: 100.0000 mg | ORAL_TABLET | Freq: Every day | ORAL | 0 refills | Status: DC

## 2019-01-18 NOTE — Patient Instructions (Signed)
Description   No more warfarin today. No warfarin tomorrow or Thursday then start taking only 1 tablet (PINK 1mg  tablet) daily.   Recheck INR in 1 week.

## 2019-01-25 ENCOUNTER — Ambulatory Visit (INDEPENDENT_AMBULATORY_CARE_PROVIDER_SITE_OTHER): Payer: Medicare Other | Admitting: *Deleted

## 2019-01-25 ENCOUNTER — Ambulatory Visit (INDEPENDENT_AMBULATORY_CARE_PROVIDER_SITE_OTHER): Payer: Medicare Other | Admitting: Cardiology

## 2019-01-25 ENCOUNTER — Encounter: Payer: Self-pay | Admitting: Cardiology

## 2019-01-25 VITALS — BP 113/69 | HR 86 | Ht 60.0 in | Wt 253.0 lb

## 2019-01-25 DIAGNOSIS — I4891 Unspecified atrial fibrillation: Secondary | ICD-10-CM

## 2019-01-25 DIAGNOSIS — Z5181 Encounter for therapeutic drug level monitoring: Secondary | ICD-10-CM

## 2019-01-25 DIAGNOSIS — I2510 Atherosclerotic heart disease of native coronary artery without angina pectoris: Secondary | ICD-10-CM | POA: Diagnosis not present

## 2019-01-25 DIAGNOSIS — I5022 Chronic systolic (congestive) heart failure: Secondary | ICD-10-CM | POA: Diagnosis not present

## 2019-01-25 DIAGNOSIS — I480 Paroxysmal atrial fibrillation: Secondary | ICD-10-CM

## 2019-01-25 LAB — POCT INR: INR: 3.6 — AB (ref 2.0–3.0)

## 2019-01-25 NOTE — Progress Notes (Signed)
Clinical Summary Stacey [name] is a 73 y.o.female seen today for follow up of the following medical problems.   1. Chronic systolic HF/ICM - Jan 0762 echo LVEF 20-25%, restrictive diastolic function, mild AS, PASP 40  - 10/2017 echo LVEF 50%, dropped to 30-35% Jan 2019 and has remained decreased  - chronic left leg edema thought to be venous insufficiency  From prior cardiologist: "She is not a candidate for device therapy given her metastatic cancer and numerous comorbidities and poor long-term prognosis. She has been scheduled to establish care with the palliative care team which I think is quite appropriate".    - last visit was to start aldactone 12.5mg  daily, she filled Rx but did not start taking. 01/10/2019 K 3.8 Cr 0.7.  - chronic SOB unchanged. Working with physical therapy    2. CAD - notes incidate history of DES to ramus, late stent thrombosis requiring PTCA in 2006. DES to RCA in Dec 2018 - SOB on brillinta, previously changed to plavix. Has been on plavix and coumadin.   - had some chest pain Sunday, better with SL NG. Last episode Sunday. - Pressure feeling michest and palpitations, some SOB and palpitations. Similar to prior pains though has been some time.     3. Mild aortic stenosis - Jan 2020 echo mean grad 11, AVA VTI 1.69  4. Chronic LBBB   5. . Metastatic breast cancer - followed at [facility]. Metastatic breast cancer to the bone and renal lesion, has pulmonary nodules. On radiation. - has been seen by palliative care previously     6. Afib - noted during Jan 2020 admission, new diagnosis - managed with Toprol, started on eliquis. NO evidence of brain mets by 10/2018 CT.   - eliquis is too expensive, changed to coumadin.  - no recent palpitations.     7. COPD - upcoming PFTs with pulmonary  8. Sleep apnea screen - upcoming test.  Past Medical History:  Diagnosis Date  . CAD in native artery    a.DES to ramus 2005 with late  stent thrombosis 2006 tx with PTCA. 12/18 PCI/DES x1 to mRCA, EF 50-55%  . CHF (congestive heart failure) ([place])   . Chronic pain   . COPD (chronic obstructive pulmonary disease) ([place])   . Hyperlipidemia   . Hypertension   . LBBB (left bundle Daryn Pisani block)   . Left bundle Shekinah Pitones block   . Lymphedema   . Metastatic breast cancer ([place])    a. to bone.  . MI (myocardial infarction) ([place])   . Mild aortic stenosis 10/2017  . Morbid obesity ([place])   . PSVT (paroxysmal supraventricular tachycardia) ([place])    a. per [facility] notes, seen on event monitor in 2014.  . Pulmonary nodules      Allergies  Allergen Reactions  . Brilinta [Ticagrelor] Other (See Comments)    Nausea and severe diarrhea- general weakness. Patient says does not want to take again  . Albuterol Other (See Comments)    Heart racing      Current Outpatient Medications  Medication Sig Dispense Refill  . acetaminophen (TYLENOL) 500 MG tablet Take 500 mg by mouth every 6 (six) hours as needed.    Marland Kitchen atorvastatin (LIPITOR) 80 MG tablet Take 80 mg by mouth every morning.     Marland Kitchen b complex vitamins tablet Take 1 tablet by mouth every morning.     . budesonide (PULMICORT) 0.25 MG/2ML nebulizer solution Take 2 mLs (0.25 mg total) by nebulization 2 (  two) times daily. 120 mL 11  . Calcium Citrate-Vitamin D (CITRACAL MAXIMUM) 315-250 MG-UNIT TABS Take 2 tablets by mouth 2 (two) times daily.    . clopidogrel (PLAVIX) 75 MG tablet Take 1 tablet (75 mg total) by mouth daily. (Patient taking differently: Take 75 mg by mouth every morning. ) 90 tablet 4  . dexamethasone (DECADRON) 2 MG tablet Take 1.5 tablets by mouth 4 (four) times daily.    . furosemide (LASIX) 80 MG tablet Take 1 tablet (80 mg total) by mouth daily. 30 tablet 0  . gabapentin (NEURONTIN) 300 MG capsule Take 300 mg by mouth every morning.    . gabapentin (NEURONTIN) 400 MG capsule Take 800 mg by mouth at bedtime.    Marland Kitchen ipratropium (ATROVENT) 0.02 % nebulizer solution Take 2.5  mLs (0.5 mg total) by nebulization every 12 (twelve) hours. (Patient not taking: Reported on 11/30/2018) 75 mL 3  . letrozole (FEMARA) 2.5 MG tablet Take 2.5 mg by mouth at bedtime.     . levalbuterol (XOPENEX) 0.63 MG/3ML nebulizer solution Take 3 mLs (0.63 mg total) by nebulization every 8 (eight) hours as needed for wheezing or shortness of breath. 3 mL 3  . losartan (COZAAR) 50 MG tablet Take 2 tablets (100 mg total) by mouth every morning.    . metoprolol succinate (TOPROL-XL) 100 MG 24 hr tablet TAKE 1 TABLET BY MOUTH DAILY. TAKE WITH OR IMMEDIATELY FOLLOWING A MEAL 90 tablet 2  . Multiple Vitamins-Minerals (EQ VISION FORMULA 50+ PO) Take 1 tablet by mouth every morning.    . Multiple Vitamins-Minerals (HAIR/SKIN/NAILS) CAPS Take 1 capsule by mouth every morning.    . nitroGLYCERIN (NITROSTAT) 0.4 MG SL tablet Place 1 tablet (0.4 mg total) under the tongue every 5 (five) minutes as needed. (Patient taking differently: Place 0.4 mg under the tongue every 5 (five) minutes as needed for chest pain. ) 25 tablet 0  . Oxycodone HCl 10 MG TABS Take 10 mg by mouth every 4 (four) hours as needed (for pain).     . Potassium 99 MG TABS Take 2 tablets by mouth every morning.     Marland Kitchen spironolactone (ALDACTONE) 25 MG tablet Take 0.5 tablets (12.5 mg total) by mouth daily. 45 tablet 1  . Ubiquinol (QUNOL COQ10/UBIQUINOL/MEGA) 100 MG CAPS Take 1 capsule by mouth every morning. QUNOL-Ultra COQ10    . warfarin (COUMADIN) 1 MG tablet Take 1 tablet daily or as directed by Coumadin clinic 30 tablet 0   No current facility-administered medications for this visit.      Past Surgical History:  Procedure Laterality Date  . CORONARY STENT INTERVENTION N/A 11/26/2017   Procedure: CORONARY STENT INTERVENTION;  Surgeon: Martinique, Peter M, MD;  Location: [facility];  Service: Cardiovascular;  Laterality: N/A;  . CORONARY STENT PLACEMENT    . LEFT HEART CATH AND CORONARY ANGIOGRAPHY N/A 11/26/2017   Procedure:  LEFT HEART CATH AND CORONARY ANGIOGRAPHY;  Surgeon: Martinique, Peter M, MD;  Location: [facility];  Service: Cardiovascular;  Laterality: N/A;     Allergies  Allergen Reactions  . Brilinta [Ticagrelor] Other (See Comments)    Nausea and severe diarrhea- general weakness. Patient says does not want to take again  . Albuterol Other (See Comments)    Heart racing       Family History  Problem Relation Age of Onset  . CAD Father 1  . Heart attack Father   . COPD Sister   . CAD Paternal Grandmother   .  Sudden Cardiac Death Neg Hx      Social History Ms. Rayle reports that she quit smoking about 38 years ago. Her smoking use included cigarettes. She has a 36.00 pack-year smoking history. She has never used smokeless tobacco. Ms. Olmsted reports no history of alcohol use.   Review of Systems CONSTITUTIONAL: No weight loss, fever, chills, weakness or fatigue.  HEENT: Eyes: No visual loss, blurred vision, double vision or yellow sclerae.No hearing loss, sneezing, congestion, runny nose or sore throat.  SKIN: No rash or itching.  CARDIOVASCULAR:  RESPIRATORY: No shortness of breath, cough or sputum.  GASTROINTESTINAL: No anorexia, nausea, vomiting or diarrhea. No abdominal pain or blood.  GENITOURINARY: No burning on urination, no polyuria NEUROLOGICAL: No headache, dizziness, syncope, paralysis, ataxia, numbness or tingling in the extremities. No change in bowel or bladder control.  MUSCULOSKELETAL: No muscle, back pain, joint pain or stiffness.  LYMPHATICS: No enlarged nodes. No history of splenectomy.  PSYCHIATRIC: No history of depression or anxiety.  ENDOCRINOLOGIC: No reports of sweating, cold or heat intolerance. No polyuria or polydipsia.  Marland Kitchen   Physical Examination Today's Vitals   01/25/19 1130  BP: 113/69  Pulse: 86  SpO2: 94%  Weight: 253 lb (114.8 kg)  Height: 5' (1.524 m)   Body mass index is 49.41 kg/m.  Gen: resting comfortably, no acute  distress HEENT: no scleral icterus, pupils equal round and reactive, no palptable cervical adenopathy,  CV: RRR, no mr/g, no jvd Resp: Clear to auscultation bilaterally GI: abdomen is soft, non-tender, non-distended, normal bowel sounds, no hepatosplenomegaly MSK: extremities are warm, 1+ nonpitting left leg edema Skin: warm, no rash Neuro:  no focal deficits Psych: appropriate affect   Diagnostic Studies 10/2017 cath  Ost Ramus to Ramus lesion is 100% stenosed.  Mid RCA lesion is 90% stenosed.  A drug-eluting stent was successfully placed using a STENT SYNERGY DES 2.5X16.  Post intervention, there is a 0% residual stenosis.  LV end diastolic pressure is normal.  1. 2 vessel obstructive CAD - 100% proximal ramus intermediate at site of prior stent. I think this is a CTO and she has good collaterals.  - 90% ulcerative mid RCA 2. Normal LVEDP 3. Successful PCI of the mid RCA with DES   10/2017 echo Study Conclusions  - Left ventricle: The cavity size was normal. There was mild concentric hypertrophy. Systolic function was normal. The estimated ejection fraction was in the range of 50% to 55%. There was an increased relative contribution of atrial contraction to ventricular filling. Doppler parameters are consistent with abnormal left ventricular relaxation (grade 1 diastolic dysfunction). - Aortic valve: There was mild stenosis. Valve area (VTI): 1.66 cm^2. Valve area (Vmax): 1.71 cm^2. Valve area (Vmean): 1.62 cm^2. - Mitral valve: Calcified annulus. Mildly thickened leaflets . - Pulmonary arteries: PA peak pressure: 32 mm Hg (S).  Jan 2019 echo Study Conclusions  - Left ventricle: The cavity size was normal. There was mild concentric hypertrophy. Systolic function was moderately to severely reduced. The estimated ejection fraction was in the range of 30% to 35%. Diffuse hypokinesis. Doppler parameters are consistent with  abnormal left ventricular relaxation (grade 1 diastolic dysfunction). - Aortic valve: Probably trileaflet; moderately thickened, severely calcified leaflets. There was mild stenosis. Peak gradient (S): 13 mm Hg. Valve area (Vmax): 1.26 cm^2. - Mitral valve: Calcified annulus. Mildly thickened leaflets . There was mild regurgitation. - Right ventricle: The cavity size was normal. Wall thickness was normal. Systolic function was normal. - Right atrium: The  atrium was normal in size. - Pulmonary arteries: Systolic pressure could not be accurately estimated. - Inferior vena cava: The vessel was normal in size. - Pericardium, extracardiac: There was no pericardial effusion.  Impressions:  - Since the last study on 11/24/17 LVEF has decreased from 50% to 30-35% with diffuse hypokinesis.  Jan 2020 echo Study Conclusions  - Left ventricle: The cavity size was normal. Wall thickness was normal. Systolic function was severely reduced. The estimated ejection fraction was in the range of 20% to 25%. Diffuse hypokinesis. Doppler parameters are consistent with restrictive physiology, indicative of decreased left ventricular diastolic compliance and/or increased left atrial pressure. Doppler parameters are consistent with high ventricular filling pressure. - Aortic valve: There was mild stenosis. Valve area (VTI): 1.69 cm^2. Valve area (Vmax): 1.73 cm^2. Valve area (Vmean): 1.74 cm^2. - Mitral valve: Calcified annulus. There was mild regurgitation. - Left atrium: The atrium was mildly dilated. - Atrial septum: No defect or patent foramen ovale was identified. - Tricuspid valve: There was mild regurgitation. - Pulmonary arteries: PA peak pressure: 40 mm Hg (S).    Assessment and Plan  1. Chronic systolic HF - did not start aldactone as recommended last visit, we go ahead and start after this visit. Check BMET in 2 weeks.   She is concerned about cost with  entresto, will not change at this time - appears repeat cath not pursued after her LVEF drop due to her metastatic cancer. Follow progress of this as well as her CHF with medical therapy, may reconsider pending changes  - euvolemic, continue to monitor fluid status.     2. PAF - DOACs too expensive, she was changed to coumadin - continue current meds, no symptoms.   3. CAD - history of late stent thrombosis, continue plavix. - 2 isolated episodes of chest pain. Monitor at this time. Due to advanced breast cancer would favor conservative management at this time.     Arnoldo Lenis, M.D

## 2019-01-25 NOTE — Patient Instructions (Signed)
Hold coumadin tonight then decrease dose to 1 tablet (1mg ) daily except 1/2 tablet (0.5mg ) on Tuesdays and Fridays.   Recheck INR in 1 week.

## 2019-01-25 NOTE — Patient Instructions (Addendum)
Your physician recommends that you schedule a follow-up appointment in: 2 [place] has recommended you make the following change in your medication:   PLEASE START SPIRONOLACTONE 12.5 MG (1/2 TABLET) DAILY   Your physician recommends that you return for lab work in: 2 WEEKS BMP   Thank you for choosing [facility]!!

## 2019-01-31 ENCOUNTER — Other Ambulatory Visit (HOSPITAL_BASED_OUTPATIENT_CLINIC_OR_DEPARTMENT_OTHER): Payer: Self-pay

## 2019-01-31 DIAGNOSIS — G4733 Obstructive sleep apnea (adult) (pediatric): Secondary | ICD-10-CM

## 2019-02-03 ENCOUNTER — Ambulatory Visit (INDEPENDENT_AMBULATORY_CARE_PROVIDER_SITE_OTHER): Payer: Medicare Other | Admitting: *Deleted

## 2019-02-03 DIAGNOSIS — Z5181 Encounter for therapeutic drug level monitoring: Secondary | ICD-10-CM | POA: Diagnosis not present

## 2019-02-03 DIAGNOSIS — I4891 Unspecified atrial fibrillation: Secondary | ICD-10-CM

## 2019-02-03 LAB — POCT INR: INR: 2.4 (ref 2.0–3.0)

## 2019-02-03 MED ORDER — FUROSEMIDE 80 MG PO TABS: 80.0000 mg | ORAL_TABLET | Freq: Every day | ORAL | 3 refills | Status: DC

## 2019-02-03 MED ORDER — WARFARIN SODIUM 1 MG PO TABS: ORAL_TABLET | ORAL | 3 refills | Status: DC

## 2019-02-03 NOTE — Patient Instructions (Signed)
Continue coumadin 1 tablet (1mg ) daily except 1/2 tablet (0.5mg ) on Tuesdays and Fridays.   Recheck INR in 2 weeks

## 2019-02-04 ENCOUNTER — Other Ambulatory Visit: Payer: Self-pay

## 2019-02-04 ENCOUNTER — Other Ambulatory Visit (HOSPITAL_COMMUNITY)
Admission: RE | Admit: 2019-02-04 | Discharge: 2019-02-04 | Disposition: A | Discharge status: Home / Self Care | Payer: Medicare Other | Source: Ambulatory Visit | Attending: Cardiology | Admitting: Cardiology

## 2019-02-04 DIAGNOSIS — I5022 Chronic systolic (congestive) heart failure: Secondary | ICD-10-CM | POA: Diagnosis present

## 2019-02-04 LAB — BASIC METABOLIC PANEL
ANION GAP: 7 (ref 5–15)
BUN: 25 mg/dL — ABNORMAL HIGH (ref 8–23)
CHLORIDE: 102 mmol/L (ref 98–111)
CO2: 31 mmol/L (ref 22–32)
Calcium: 8.4 mg/dL — ABNORMAL LOW (ref 8.9–10.3)
Creatinine, Ser: 0.75 mg/dL (ref 0.44–1.00)
GFR calc Af Amer: >60 mL/min (ref >60)
GFR calc non Af Amer: >60 mL/min (ref >60)
Glucose, Bld: 106 mg/dL — ABNORMAL HIGH (ref 70–99)
POTASSIUM: 4.6 mmol/L (ref 3.5–5.1)
Sodium: 140 mmol/L (ref 135–145)

## 2019-02-10 ENCOUNTER — Telehealth: Payer: Self-pay | Admitting: *Deleted

## 2019-02-10 NOTE — Telephone Encounter (Signed)
Pt aware - routed to pcp  

## 2019-02-10 NOTE — Telephone Encounter (Signed)
-----   Message from Arnoldo Lenis, MD sent at 02/09/2019 12:47 PM EDT ----- Labs look good   Bettey Costa MD

## 2019-02-17 ENCOUNTER — Other Ambulatory Visit: Payer: Self-pay

## 2019-02-17 ENCOUNTER — Ambulatory Visit (INDEPENDENT_AMBULATORY_CARE_PROVIDER_SITE_OTHER): Payer: Medicare Other | Admitting: *Deleted

## 2019-02-17 DIAGNOSIS — I4891 Unspecified atrial fibrillation: Secondary | ICD-10-CM

## 2019-02-17 DIAGNOSIS — Z5181 Encounter for therapeutic drug level monitoring: Secondary | ICD-10-CM | POA: Diagnosis not present

## 2019-02-17 LAB — POCT INR: INR: 1.4 — AB (ref 2.0–3.0)

## 2019-02-17 NOTE — Patient Instructions (Signed)
Take coumadin 2 tablets tonight then increase dose to 1 tablet daily    Recheck INR in 1 week

## 2019-02-22 ENCOUNTER — Telehealth: Payer: Self-pay | Admitting: Pharmacist

## 2019-02-22 NOTE — Telephone Encounter (Signed)

## 2019-02-23 ENCOUNTER — Other Ambulatory Visit: Payer: Self-pay

## 2019-02-23 ENCOUNTER — Ambulatory Visit (INDEPENDENT_AMBULATORY_CARE_PROVIDER_SITE_OTHER): Payer: Medicare Other | Admitting: *Deleted

## 2019-02-23 DIAGNOSIS — I4891 Unspecified atrial fibrillation: Secondary | ICD-10-CM

## 2019-02-23 DIAGNOSIS — Z5181 Encounter for therapeutic drug level monitoring: Secondary | ICD-10-CM | POA: Diagnosis not present

## 2019-02-23 LAB — POCT INR: INR: 1.9 — AB (ref 2.0–3.0)

## 2019-02-23 NOTE — Patient Instructions (Signed)
Increase coumadin to 1 tablet daily except 1 1/2 tablets on Wednesdays Recheck INR in 2 weeks

## 2019-03-07 ENCOUNTER — Telehealth: Payer: Self-pay | Admitting: Cardiology

## 2019-03-07 NOTE — Telephone Encounter (Signed)
° ° ° °  COVID-19 Pre-Screening Questions: ° °• Do you currently have a fever? No °•  °• Have you recently travelled on a cruise, internationally, or to [place], [place], [place], [place], [place], or [place], [place] ([place]) ? No °•  °• Have you been in contact with someone that is currently pending confirmation of Covid19 testing or has been confirmed to have the Covid19 virus? No °•  °• Are you currently experiencing fatigue or cough? No  ° ° °   ° ° ° ° ° °

## 2019-03-08 ENCOUNTER — Ambulatory Visit (INDEPENDENT_AMBULATORY_CARE_PROVIDER_SITE_OTHER): Payer: Medicare Other | Admitting: *Deleted

## 2019-03-08 ENCOUNTER — Other Ambulatory Visit: Payer: Self-pay

## 2019-03-08 DIAGNOSIS — Z5181 Encounter for therapeutic drug level monitoring: Secondary | ICD-10-CM | POA: Diagnosis not present

## 2019-03-08 DIAGNOSIS — I4891 Unspecified atrial fibrillation: Secondary | ICD-10-CM

## 2019-03-08 LAB — POCT INR: INR: 2.3 (ref 2.0–3.0)

## 2019-03-08 NOTE — Patient Instructions (Signed)
Continue coumadin 1 tablet daily except 1 1/2 tablets on Wednesdays Recheck INR in 3 weeks

## 2019-03-09 ENCOUNTER — Other Ambulatory Visit: Payer: Self-pay | Admitting: Cardiology

## 2019-03-09 MED ORDER — NITROGLYCERIN 0.4 MG SL SUBL: 0.4000 mg | SUBLINGUAL_TABLET | SUBLINGUAL | 2 refills | Status: DC | PRN

## 2019-03-09 NOTE — Telephone Encounter (Signed)
°*  STAT* If patient is at the pharmacy, call can be transferred to refill team.   1. Which medications need to be refilled? (please list name of each medication and dose if known) nitroGLYCERIN (NITROSTAT) 0.4 MG SL tablet [[id number]]    2. Which pharmacy/location (including street and city if local pharmacy) is medication to be sent to? [facility] [place]  3. Do they need a 30 day or 90 day supply?

## 2019-03-25 ENCOUNTER — Other Ambulatory Visit: Payer: Self-pay | Admitting: *Deleted

## 2019-03-25 MED ORDER — LOSARTAN POTASSIUM 100 MG PO TABS: 100.0000 mg | ORAL_TABLET | Freq: Every day | ORAL | 3 refills | Status: DC

## 2019-03-25 MED ORDER — SPIRONOLACTONE 25 MG PO TABS: 12.5000 mg | ORAL_TABLET | Freq: Every day | ORAL | 2 refills | Status: DC

## 2019-03-25 MED ORDER — CLOPIDOGREL BISULFATE 75 MG PO TABS: 75.0000 mg | ORAL_TABLET | Freq: Every day | ORAL | 3 refills | Status: DC

## 2019-03-25 MED ORDER — ATORVASTATIN CALCIUM 80 MG PO TABS: 80.0000 mg | ORAL_TABLET | Freq: Every morning | ORAL | 3 refills | Status: DC

## 2019-03-25 MED ORDER — FUROSEMIDE 80 MG PO TABS: 80.0000 mg | ORAL_TABLET | Freq: Every day | ORAL | 3 refills | Status: DC

## 2019-03-25 MED ORDER — METOPROLOL SUCCINATE ER 100 MG PO TB24: ORAL_TABLET | ORAL | 3 refills | Status: DC

## 2019-03-28 ENCOUNTER — Telehealth: Payer: Self-pay | Admitting: Cardiology

## 2019-03-28 NOTE — Telephone Encounter (Signed)
Virtual Visit Pre-Appointment Phone Call  "(Name), I am calling you today to discuss your upcoming appointment. We are currently trying to limit exposure to the virus that causes COVID-19 by seeing patients at home rather than in the office."  1. "What is the BEST phone number to call the day of the visit?" - include this in appointment notes  2. Do you have or have access to (through a family member/friend) a smartphone with video capability that we can use for your visit?" a. If yes - list this number in appt notes as cell (if different from BEST phone #) and list the appointment type as a VIDEO visit in appointment notes b. If no - list the appointment type as a PHONE visit in appointment notes  3. Confirm consent - "In the setting of the current Covid19 crisis, you are scheduled for a (phone or video) visit with your provider on (date) at (time).  Just as we do with many in-office visits, in order for you to participate in this visit, we must obtain consent.  If you'd like, I can send this to your mychart (if signed up) or email for you to review.  Otherwise, I can obtain your verbal consent now.  All virtual visits are billed to your insurance company just like a normal visit would be.  By agreeing to a virtual visit, we'd like you to understand that the technology does not allow for your provider to perform an examination, and thus may limit your provider's ability to fully assess your condition. If your provider identifies any concerns that need to be evaluated in person, we will make arrangements to do so.  Finally, though the technology is pretty good, we cannot assure that it will always work on either your or our end, and in the setting of a video visit, we may have to convert it to a phone-only visit.  In either situation, we cannot ensure that we have a secure connection.  Are you willing to proceed?" STAFF: Did the patient verbally acknowledge consent to telehealth visit? Document  YES/NO here: yes  4. Advise patient to be prepared - "Two hours prior to your appointment, go ahead and check your blood pressure, pulse, oxygen saturation, and your weight (if you have the equipment to check those) and write them all down. When your visit starts, your provider will ask you for this information. If you have an Apple Watch or Kardia device, please plan to have heart rate information ready on the day of your appointment. Please have a pen and paper handy nearby the day of the visit as well."  5. Give patient instructions for MyChart download to smartphone OR Doximity/[email or web address] as below if video visit (depending on what platform provider is using)  6. Inform patient they will receive a phone call 15 minutes prior to their appointment time (may be from unknown caller ID) so they should be prepared to answer    [place] has been deemed a candidate for a follow-up tele-health visit to limit community exposure during the Covid-19 pandemic. I spoke with the patient via phone to ensure availability of phone/video source, confirm preferred email & phone number, and discuss instructions and expectations.  I reminded [name] to be prepared with any vital sign and/or heart rhythm information that could potentially be obtained via home monitoring, at the time of her visit. I reminded [name] to expect a phone call prior to  her visit.  Weston Anna 03/28/2019 10:22 AM   INSTRUCTIONS FOR DOWNLOADING THE MYCHART APP TO SMARTPHONE  - The patient must first make sure to have activated MyChart and know their login information - If Apple, go to [employer] and type in MyChart in the search bar and download the app. If Android, ask patient to go to [employer] and type in [place] in the search bar and download the app. The app is free but as with any other app downloads, their phone may require them to verify saved payment information or  Apple/Android password.  - The patient will need to then log into the app with their MyChart username and password, and select [facility] as their healthcare provider to link the account. When it is time for your visit, go to the MyChart app, find appointments, and click Begin Video Visit. Be sure to Select Allow for your device to access the Microphone and Camera for your visit. You will then be connected, and your provider will be with you shortly.  **If they have any issues connecting, or need assistance please contact MyChart service desk [phone])**  **If using a computer, in order to ensure the best quality for their visit they will need to use either of the following Internet Browsers: [employer], or Google Chrome**  IF USING DOXIMITY or DOXY.ME - The patient will receive a link just prior to their visit by text.     FULL LENGTH CONSENT FOR TELE-HEALTH VISIT   I hereby voluntarily request, consent and authorize [facility] and its employed or contracted physicians, physician assistants, nurse practitioners or other licensed health care professionals (the Practitioner), to provide me with telemedicine health care services (the Services") as deemed necessary by the treating Practitioner. I acknowledge and consent to receive the Services by the Practitioner via telemedicine. I understand that the telemedicine visit will involve communicating with the Practitioner through live audiovisual communication technology and the disclosure of certain medical information by electronic transmission. I acknowledge that I have been given the opportunity to request an in-person assessment or other available alternative prior to the telemedicine visit and am voluntarily participating in the telemedicine visit.  I understand that I have the right to withhold or withdraw my consent to the use of telemedicine in the course of my care at any time, without affecting my right to future care  or treatment, and that the Practitioner or I may terminate the telemedicine visit at any time. I understand that I have the right to inspect all information obtained and/or recorded in the course of the telemedicine visit and may receive copies of available information for a reasonable fee.  I understand that some of the potential risks of receiving the Services via telemedicine include:   Delay or interruption in medical evaluation due to technological equipment failure or disruption;  Information transmitted may not be sufficient (e.g. poor resolution of images) to allow for appropriate medical decision making by the Practitioner; and/or   In rare instances, security protocols could fail, causing a breach of personal health information.  Furthermore, I acknowledge that it is my responsibility to provide information about my medical history, conditions and care that is complete and accurate to the best of my ability. I acknowledge that Practitioner's advice, recommendations, and/or decision may be based on factors not within their control, such as incomplete or inaccurate data provided by me or distortions of diagnostic images or specimens that may result from electronic transmissions. I  understand that the practice of medicine is not an exact science and that Practitioner makes no warranties or guarantees regarding treatment outcomes. I acknowledge that I will receive a copy of this consent concurrently upon execution via email to the email address I last provided but may also request a printed copy by calling the office of [facility].    I understand that my insurance will be billed for this visit.   I have read or had this consent read to me.  I understand the contents of this consent, which adequately explains the benefits and risks of the Services being provided via telemedicine.   I have been provided ample opportunity to ask questions regarding this consent and the Services and have had  my questions answered to my satisfaction.  I give my informed consent for the services to be provided through the use of telemedicine in my medical care  By participating in this telemedicine visit I agree to the above.

## 2019-03-28 NOTE — Telephone Encounter (Signed)
° ° °  COVID-19 Pre-Screening Questions: ° °• Do you currently have a fever? NO °• Have you recently travelled on a cruise, internationally, or to [place], [place], [place], [place], [place], or [place], [place] ([place]) ?No °• Have you been in contact with someone that is currently pending confirmation of Covid19 testing or has been confirmed to have the Covid19 virus?  No °Are you currently experiencing fatigue or cough? No ° ° °   ° ° ° ° °

## 2019-03-29 ENCOUNTER — Telehealth: Payer: Self-pay | Admitting: *Deleted

## 2019-03-29 ENCOUNTER — Ambulatory Visit (INDEPENDENT_AMBULATORY_CARE_PROVIDER_SITE_OTHER): Payer: Medicare Other | Admitting: *Deleted

## 2019-03-29 DIAGNOSIS — Z5181 Encounter for therapeutic drug level monitoring: Secondary | ICD-10-CM

## 2019-03-29 DIAGNOSIS — I4891 Unspecified atrial fibrillation: Secondary | ICD-10-CM

## 2019-03-29 LAB — POCT INR: INR: 2.6 (ref 2.0–3.0)

## 2019-03-29 NOTE — Patient Instructions (Signed)
Continue coumadin 1 tablet daily except 1 1/2 tablets on Wednesdays Recheck INR in 3 weeks

## 2019-03-29 NOTE — Telephone Encounter (Signed)
Thomas with [facility] pharmacy wanted to verify pt dose of Toprol XL 100 mg daily - confirmed.

## 2019-03-30 ENCOUNTER — Telehealth (INDEPENDENT_AMBULATORY_CARE_PROVIDER_SITE_OTHER): Payer: Medicare Other | Admitting: Cardiology

## 2019-03-30 ENCOUNTER — Encounter: Payer: Self-pay | Admitting: Cardiology

## 2019-03-30 VITALS — Ht 60.0 in | Wt 253.0 lb

## 2019-03-30 DIAGNOSIS — I2510 Atherosclerotic heart disease of native coronary artery without angina pectoris: Secondary | ICD-10-CM

## 2019-03-30 DIAGNOSIS — I5022 Chronic systolic (congestive) heart failure: Secondary | ICD-10-CM | POA: Diagnosis not present

## 2019-03-30 NOTE — Patient Instructions (Signed)
Your physician recommends that you schedule a follow-up appointment in: 2 MONTHS WITH DR BRANCH  Your physician recommends that you continue on your current medications as directed. Please refer to the Current Medication list given to you today.  Thank you for choosing [facility]!!    

## 2019-03-30 NOTE — Progress Notes (Signed)
Virtual Visit via Telephone Note   This visit type was conducted due to national recommendations for restrictions regarding the COVID-19 Pandemic (e.g. social distancing) in an effort to limit this patient's exposure and mitigate transmission in our community.  Due to her co-morbid illnesses, this patient is at least at moderate risk for complications without adequate follow up.  This format is felt to be most appropriate for this patient at this time.  The patient did not have access to video technology/had technical difficulties with video requiring transitioning to audio format only (telephone).  All issues noted in this document were discussed and addressed.  No physical exam could be performed with this format.  Please refer to the patient's chart for her  consent to telehealth for [facility].   Evaluation Performed:  Follow-up visit  Date:  03/30/2019   ID:  Stacey [name], DOB [birth year], MRN [id number]  Patient Location: Home Provider Location: Office  PCP:  Sinda Du, MD  Cardiologist:  Carlyle Dolly, MD  Electrophysiologist:  None   Chief Complaint:  2 month follow up  History of Present Illness:    Stacey [name] is a 73 y.o. female seen today for follow up of the following medical problems. This is a focused visit on her history of chronic systolic HF and CAD   1. Chronic systolic HF/ICM - Jan 3570 echo LVEF 20-25%, restrictive diastolic function, mild AS, PASP 40  - 10/2017 echo LVEF 50%, dropped to 30-35% Jan 2019 and has remained decreased  - chronic left leg edema thought to be venous insufficiency  From prior cardiologist: "She is not a candidate for device therapy given her metastatic cancer and numerous comorbidities and poor long-term prognosis. She has been scheduled to establish care with the palliative care team which I think is quite appropriate".   - last visit she started aldactone. Repeat labs showed stable K and Cr - she has  concern about the cost of entresto, we have not changed - compliant with meds. Does not check bp at home, no machine.  - no SOb/DOE. Chronic LE edema unchanged. Weights are stable 253 lbs     2. CAD - notes incidate history of DES to ramus, late stentthrombosis requiring PTCA in 2006. DES to RCA in Dec 2018 - SOB on brillinta, previously changed to plavix.Has been on plavix and coumadin.   - denies any chest pain.     The patient does not have symptoms concerning for COVID-19 infection (fever, chills, cough, or new shortness of breath).    Past Medical History:  Diagnosis Date  . CAD in native artery    a.DES to ramus 2005 with late stent thrombosis 2006 tx with PTCA. 12/18 PCI/DES x1 to mRCA, EF 50-55%  . CHF (congestive heart failure) ([place])   . Chronic pain   . COPD (chronic obstructive pulmonary disease) ([place])   . Hyperlipidemia   . Hypertension   . LBBB (left bundle Jamilyn Pigeon block)   . Left bundle Kass Herberger block   . Lymphedema   . Metastatic breast cancer ([place])    a. to bone.  . MI (myocardial infarction) ([place])   . Mild aortic stenosis 10/2017  . Morbid obesity ([place])   . PSVT (paroxysmal supraventricular tachycardia) ([place])    a. per [facility] notes, seen on event monitor in 2014.  . Pulmonary nodules    Past Surgical History:  Procedure Laterality Date  . CORONARY STENT INTERVENTION N/A 11/26/2017   Procedure: CORONARY STENT INTERVENTION;  Surgeon: Martinique, Peter M, MD;  Location: [place] [facility];  Service: Cardiovascular;  Laterality: N/A;  . CORONARY STENT PLACEMENT    . LEFT HEART CATH AND CORONARY ANGIOGRAPHY N/A 11/26/2017   Procedure: LEFT HEART CATH AND CORONARY ANGIOGRAPHY;  Surgeon: Martinique, Peter M, MD;  Location: [place] [facility];  Service: Cardiovascular;  Laterality: N/A;     No outpatient medications have been marked as taking for the 03/30/19 encounter (Appointment) with Arnoldo Lenis, MD.     Allergies:   Brilinta [ticagrelor] and Albuterol    Social History   Tobacco Use  . Smoking status: Former Smoker    Packs/day: 2.00    Years: 18.00    Pack years: 36.00    Types: Cigarettes    Last attempt to quit: 12/01/1980    Years since quitting: 38.3  . Smokeless tobacco: Never Used  Substance Use Topics  . Alcohol use: No    Frequency: Never  . Drug use: No     Family Hx: The patient's family history includes CAD in her paternal grandmother; CAD (age of onset: 43) in her father; COPD in her sister; Heart attack in her father. There is no history of Sudden Cardiac Death.  ROS:   Please see the history of present illness.     All other systems reviewed and are negative.   Prior CV studies:   The following studies were reviewed today:  10/2017 cath  Ost Ramus to Ramus lesion is 100% stenosed.  Mid RCA lesion is 90% stenosed.  A drug-eluting stent was successfully placed using a STENT SYNERGY DES 2.5X16.  Post intervention, there is a 0% residual stenosis.  LV end diastolic pressure is normal.  1. 2 vessel obstructive CAD - 100% proximal ramus intermediate at site of prior stent. I think this is a CTO and she has good collaterals.  - 90% ulcerative mid RCA 2. Normal LVEDP 3. Successful PCI of the mid RCA with DES   10/2017 echo Study Conclusions  - Left ventricle: The cavity size was normal. There was mild concentric hypertrophy. Systolic function was normal. The estimated ejection fraction was in the range of 50% to 55%. There was an increased relative contribution of atrial contraction to ventricular filling. Doppler parameters are consistent with abnormal left ventricular relaxation (grade 1 diastolic dysfunction). - Aortic valve: There was mild stenosis. Valve area (VTI): 1.66 cm^2. Valve area (Vmax): 1.71 cm^2. Valve area (Vmean): 1.62 cm^2. - Mitral valve: Calcified annulus. Mildly thickened leaflets . - Pulmonary arteries: PA peak pressure: 32 mm Hg (S).  Jan 2019  echo Study Conclusions  - Left ventricle: The cavity size was normal. There was mild concentric hypertrophy. Systolic function was moderately to severely reduced. The estimated ejection fraction was in the range of 30% to 35%. Diffuse hypokinesis. Doppler parameters are consistent with abnormal left ventricular relaxation (grade 1 diastolic dysfunction). - Aortic valve: Probably trileaflet; moderately thickened, severely calcified leaflets. There was mild stenosis. Peak gradient (S): 13 mm Hg. Valve area (Vmax): 1.26 cm^2. - Mitral valve: Calcified annulus. Mildly thickened leaflets . There was mild regurgitation. - Right ventricle: The cavity size was normal. Wall thickness was normal. Systolic function was normal. - Right atrium: The atrium was normal in size. - Pulmonary arteries: Systolic pressure could not be accurately estimated. - Inferior vena cava: The vessel was normal in size. - Pericardium, extracardiac: There was no pericardial effusion.  Impressions:  - Since the last study on 11/24/17 LVEF has decreased  from 50% to 30-35% with diffuse hypokinesis.  Jan 2020 echo Study Conclusions  - Left ventricle: The cavity size was normal. Wall thickness was normal. Systolic function was severely reduced. The estimated ejection fraction was in the range of 20% to 25%. Diffuse hypokinesis. Doppler parameters are consistent with restrictive physiology, indicative of decreased left ventricular diastolic compliance and/or increased left atrial pressure. Doppler parameters are consistent with high ventricular filling pressure. - Aortic valve: There was mild stenosis. Valve area (VTI): 1.69 cm^2. Valve area (Vmax): 1.73 cm^2. Valve area (Vmean): 1.74 cm^2. - Mitral valve: Calcified annulus. There was mild regurgitation. - Left atrium: The atrium was mildly dilated. - Atrial septum: No defect or patent foramen ovale was identified. -  Tricuspid valve: There was mild regurgitation. - Pulmonary arteries: PA peak pressure: 40 mm Hg (S).  Labs/Other Tests and Data Reviewed:    EKG:  na  Recent Labs: 11/13/2018: ALT 31 11/30/2018: B Natriuretic Peptide 552.6; Hemoglobin 12.3; Platelets 186; TSH 1.434 12/01/2018: Magnesium 2.0 02/04/2019: BUN 25; Creatinine, Ser 0.75; Potassium 4.6; Sodium 140   Recent Lipid Panel No results found for: CHOL, TRIG, HDL, CHOLHDL, LDLCALC, LDLDIRECT  Wt Readings from Last 3 Encounters:  01/25/19 253 lb (114.8 kg)  12/14/18 248 lb (112.5 kg)  12/02/18 245 lb 13 oz (111.5 kg)     Objective:    Vital Signs:  There were no vitals taken for this visit.   Normal affect. Normal speech pattern and tone. Comfortable, no apparent distress. No audible signs of SOB or wheezing.   ASSESSMENT & PLAN:    1. Chronic systolic HF - no recent symptoms, weights are stable. Chronic edema reportedly unchanged - continue current meds. Limited ability to titrate remotely since she does not have a home vitals machine, would also try to limit her exposure to labs and blood work with COVID-19. She essentially is on a good regimen at this time. Remains hesistant for entresto due to cost   2. CAD - no recent ysmptoms, continue current meds     COVID-19 Education: The signs and symptoms of COVID-19 were discussed with the patient and how to seek care for testing (follow up with PCP or arrange E-visit). The importance of social distancing was discussed today.  Time:   Today, I have spent 18 minutes with the patient with telehealth technology discussing the above problems.     Medication Adjustments/Labs and Tests Ordered: Current medicines are reviewed at length with the patient today.  Concerns regarding medicines are outlined above.   Tests Ordered: No orders of the defined types were placed in this encounter.   Medication Changes: No orders of the defined types were placed in this encounter.    Disposition:  Follow up 2 months  Signed, Carlyle Dolly, MD  03/30/2019 10:17 AM    [place]

## 2019-04-06 ENCOUNTER — Telehealth: Payer: Self-pay | Admitting: *Deleted

## 2019-04-06 MED ORDER — WARFARIN SODIUM 1 MG PO TABS: ORAL_TABLET | ORAL | 3 refills | Status: DC

## 2019-04-06 NOTE — Telephone Encounter (Signed)
Rx sent to [facility]

## 2019-04-06 NOTE — Telephone Encounter (Signed)
Needing refill on warfarin (COUMADIN) 1 MG tablet [[id number]]  Sent to [facility] on [address] for this one time.

## 2019-04-20 ENCOUNTER — Telehealth: Payer: Self-pay | Admitting: *Deleted

## 2019-04-20 NOTE — Telephone Encounter (Signed)

## 2019-04-21 ENCOUNTER — Other Ambulatory Visit: Payer: Self-pay

## 2019-04-21 ENCOUNTER — Ambulatory Visit (INDEPENDENT_AMBULATORY_CARE_PROVIDER_SITE_OTHER): Payer: Medicare Other | Admitting: *Deleted

## 2019-04-21 DIAGNOSIS — Z5181 Encounter for therapeutic drug level monitoring: Secondary | ICD-10-CM

## 2019-04-21 DIAGNOSIS — I4891 Unspecified atrial fibrillation: Secondary | ICD-10-CM

## 2019-04-21 LAB — POCT INR: INR: 3 (ref 2.0–3.0)

## 2019-04-21 NOTE — Patient Instructions (Signed)
Continue coumadin 1 tablet daily except 1 1/2 tablets on Wednesdays Recheck INR in 4 weeks

## 2019-05-05 ENCOUNTER — Encounter: Payer: Self-pay | Admitting: Orthopaedic Surgery

## 2019-05-05 ENCOUNTER — Other Ambulatory Visit: Payer: Self-pay

## 2019-05-05 ENCOUNTER — Ambulatory Visit (INDEPENDENT_AMBULATORY_CARE_PROVIDER_SITE_OTHER): Payer: Medicare Other

## 2019-05-05 ENCOUNTER — Ambulatory Visit (INDEPENDENT_AMBULATORY_CARE_PROVIDER_SITE_OTHER): Payer: Medicare Other | Admitting: Orthopaedic Surgery

## 2019-05-05 VITALS — BP 113/76 | HR 103 | Temp 97.3°F | Ht 60.0 in | Wt 246.0 lb

## 2019-05-05 DIAGNOSIS — M25512 Pain in left shoulder: Secondary | ICD-10-CM

## 2019-05-05 DIAGNOSIS — I2510 Atherosclerotic heart disease of native coronary artery without angina pectoris: Secondary | ICD-10-CM

## 2019-05-05 NOTE — Progress Notes (Signed)
Subjective:    Patient ID: Stacey [name], female    DOB: 1945-12-30, 73 y.o.   MRN: [id number]  HPI She has had left shoulder pain for about two to three months. She has no trauma, no swelling, no redness.  It hurts to raise up her left shoulder. She has carpal tunnel bilaterally.  She has tried ice, heat, rubs and rest with little help. She saw Dr. Luan Pulling recently and he asked that she come here.  I have reviewed his notes.   Review of Systems  Constitutional: Positive for activity change.  Respiratory: Positive for cough and shortness of breath.   Cardiovascular: Positive for chest pain.  Musculoskeletal: Positive for arthralgias, back pain, gait problem and joint swelling.  All other systems reviewed and are negative.  For Review of Systems, all other systems reviewed and are negative.  The following is a summary of the past history medically, past history surgically, known current medicines, social history and family history.  This information is gathered electronically by the computer from prior information and documentation.  I review this each visit and have found including this information at this point in the chart is beneficial and informative.   Past Medical History:  Diagnosis Date  . CAD in native artery    a.DES to ramus 2005 with late stent thrombosis 2006 tx with PTCA. 12/18 PCI/DES x1 to mRCA, EF 50-55%  . CHF (congestive heart failure) ([place])   . Chronic pain   . COPD (chronic obstructive pulmonary disease) ([place])   . Hyperlipidemia   . Hypertension   . LBBB (left bundle branch block)   . Left bundle branch block   . Lymphedema   . Metastatic breast cancer ([place])    a. to bone.  . MI (myocardial infarction) ([place])   . Mild aortic stenosis 10/2017  . Morbid obesity ([place])   . PSVT (paroxysmal supraventricular tachycardia) ([place])    a. per [facility] notes, seen on event monitor in 2014.  . Pulmonary nodules     Past Surgical History:  Procedure Laterality Date  .  CORONARY STENT INTERVENTION N/A 11/26/2017   Procedure: CORONARY STENT INTERVENTION;  Surgeon: Martinique, Peter M, MD;  Location: [place] [facility];  Service: Cardiovascular;  Laterality: N/A;  . CORONARY STENT PLACEMENT    . LEFT HEART CATH AND CORONARY ANGIOGRAPHY N/A 11/26/2017   Procedure: LEFT HEART CATH AND CORONARY ANGIOGRAPHY;  Surgeon: Martinique, Peter M, MD;  Location: [place] [facility];  Service: Cardiovascular;  Laterality: N/A;    Current Outpatient Medications on File Prior to Visit  Medication Sig Dispense Refill  . acetaminophen (TYLENOL) 500 MG tablet Take 500 mg by mouth every 6 (six) hours as needed.    Marland Kitchen atorvastatin (LIPITOR) 80 MG tablet Take 1 tablet (80 mg total) by mouth every morning. 90 tablet 3  . b complex vitamins tablet Take 1 tablet by mouth every morning.     . budesonide (PULMICORT) 0.25 MG/2ML nebulizer solution Take 2 mLs (0.25 mg total) by nebulization 2 (two) times daily. 120 mL 11  . Calcium Citrate-Vitamin D (CITRACAL MAXIMUM) 315-250 MG-UNIT TABS Take 2 tablets by mouth 2 (two) times daily.    . clopidogrel (PLAVIX) 75 MG tablet Take 1 tablet (75 mg total) by mouth daily. 90 tablet 3  . furosemide (LASIX) 80 MG tablet Take 1 tablet (80 mg total) by mouth daily. 90 tablet 3  . gabapentin (NEURONTIN) 300 MG capsule Take 300 mg by mouth every morning.    Marland Kitchen  gabapentin (NEURONTIN) 400 MG capsule Take 800 mg by mouth at bedtime.    Marland Kitchen ipratropium (ATROVENT) 0.02 % nebulizer solution Take 2.5 mLs (0.5 mg total) by nebulization every 12 (twelve) hours. (Patient not taking: Reported on 05/05/2019) 75 mL 3  . letrozole (FEMARA) 2.5 MG tablet Take 2.5 mg by mouth at bedtime.     . levalbuterol (XOPENEX) 0.63 MG/3ML nebulizer solution Take 3 mLs (0.63 mg total) by nebulization every 8 (eight) hours as needed for wheezing or shortness of breath. (Patient not taking: Reported on 05/05/2019) 3 mL 3  . losartan (COZAAR) 100 MG tablet Take 1 tablet (100 mg total) by mouth daily.  90 tablet 3  . metoprolol succinate (TOPROL-XL) 100 MG 24 hr tablet TAKE 1 TABLET BY MOUTH DAILY. TAKE WITH OR IMMEDIATELY FOLLOWING A MEAL 90 tablet 3  . Multiple Vitamins-Minerals (EQ VISION FORMULA 50+ PO) Take 1 tablet by mouth every morning.    . Multiple Vitamins-Minerals (HAIR/SKIN/NAILS) CAPS Take 1 capsule by mouth every morning.    . nitroGLYCERIN (NITROSTAT) 0.4 MG SL tablet Place 1 tablet (0.4 mg total) under the tongue every 5 (five) minutes x 3 doses as needed for chest pain (if no relief after 3rd dose, proceed to the ED or call 911). 25 tablet 2  . Oxycodone HCl 10 MG TABS Take 10 mg by mouth every 4 (four) hours as needed (for pain).     . Potassium 99 MG TABS Take 2 tablets by mouth every morning.     Marland Kitchen spironolactone (ALDACTONE) 25 MG tablet Take 0.5 tablets (12.5 mg total) by mouth daily. 45 tablet 2  . Ubiquinol (QUNOL COQ10/UBIQUINOL/MEGA) 100 MG CAPS Take 1 capsule by mouth every morning. QUNOL-Ultra COQ10    . warfarin (COUMADIN) 1 MG tablet Take 1 tablet daily except 1 1/2 tablets on Wednesdays or as directed by Coumadin clinic 40 tablet 3   No current facility-administered medications on file prior to visit.     Social History   Socioeconomic History  . Marital status: Single    Spouse name: Not on file  . Number of children: Not on file  . Years of education: Not on file  . Highest education level: Not on file  Occupational History  . Occupation: Retired  Scientific laboratory technician  . Financial resource strain: Not on file  . Food insecurity:    Worry: Not on file    Inability: Not on file  . Transportation needs:    Medical: Not on file    Non-medical: Not on file  Tobacco Use  . Smoking status: Former Smoker    Packs/day: 2.00    Years: 18.00    Pack years: 36.00    Types: Cigarettes    Last attempt to quit: 12/01/1980    Years since quitting: 38.4  . Smokeless tobacco: Never Used  Substance and Sexual Activity  . Alcohol use: No    Frequency: Never  . Drug  use: No  . Sexual activity: Not on file  Lifestyle  . Physical activity:    Days per week: Not on file    Minutes per session: Not on file  . Stress: Not on file  Relationships  . Social connections:    Talks on phone: Not on file    Gets together: Not on file    Attends religious service: Not on file    Active member of club or organization: Not on file    Attends meetings of clubs or organizations: Not on  file    Relationship status: Not on file  . Intimate partner violence:    Fear of current or ex partner: Not on file    Emotionally abused: Not on file    Physically abused: Not on file    Forced sexual activity: Not on file  Other Topics Concern  . Not on file  Social History Narrative  . Not on file    Family History  Problem Relation Age of Onset  . CAD Father 81  . Heart attack Father   . COPD Sister   . CAD Paternal Grandmother   . Sudden Cardiac Death Neg Hx     BP 113/76   Pulse (!) 103   Temp (!) 97.3 F (36.3 C)   Ht 5' (1.524 m)   Wt 246 lb (111.6 kg)   BMI 48.04 kg/m   Body mass index is 48.04 kg/m. '    Objective:   Physical Exam Vitals signs reviewed.  Constitutional:      Appearance: She is well-developed.  HENT:     Head: Normocephalic and atraumatic.  Eyes:     Conjunctiva/sclera: Conjunctivae normal.     Pupils: Pupils are equal, round, and reactive to light.  Neck:     Musculoskeletal: Normal range of motion and neck supple.  Cardiovascular:     Rate and Rhythm: Normal rate and regular rhythm.  Pulmonary:     Effort: Pulmonary effort is normal.  Abdominal:     Palpations: Abdomen is soft.  Musculoskeletal:     Left shoulder: She exhibits decreased range of motion and tenderness.       Arms:  Skin:    General: Skin is warm and dry.  Neurological:     Mental Status: She is alert and oriented to person, place, and time.     Cranial Nerves: No cranial nerve deficit.     Motor: No abnormal muscle tone.     Coordination:  Coordination normal.     Deep Tendon Reflexes: Reflexes are normal and symmetric. Reflexes normal.  Psychiatric:        Behavior: Behavior normal.        Thought Content: Thought content normal.        Judgment: Judgment normal.   x-rays were done of the right shoulder, reported separately.  Mild DJD.        Assessment & Plan:   Encounter Diagnosis  Name Primary?  . Pain in joint of left shoulder Yes   PROCEDURE NOTE:  The patient request injection, verbal consent was obtained.  The left shoulder was prepped appropriately after time out was performed.   Sterile technique was observed and injection of 1 cc of Depo-Medrol 40 mg with several cc's of plain xylocaine. Anesthesia was provided by ethyl chloride and a 20-gauge needle was used to inject the shoulder area. A posterior approach was used.  The injection was tolerated well.  A band aid dressing was applied.  The patient was advised to apply ice later today and tomorrow to the injection sight as needed.  I will see her back in three weeks.  Call if any problem.  Precautions discussed.   Electronically Signed Sanjuana Kava, MD 6/4/202010:12 AM

## 2019-05-19 ENCOUNTER — Ambulatory Visit (INDEPENDENT_AMBULATORY_CARE_PROVIDER_SITE_OTHER): Payer: Medicare Other | Admitting: Orthopaedic Surgery

## 2019-05-19 ENCOUNTER — Other Ambulatory Visit: Payer: Self-pay

## 2019-05-19 ENCOUNTER — Encounter: Payer: Self-pay | Admitting: Orthopaedic Surgery

## 2019-05-19 VITALS — BP 124/65 | HR 77 | Temp 97.4°F | Ht 60.0 in | Wt 246.0 lb

## 2019-05-19 DIAGNOSIS — M25512 Pain in left shoulder: Secondary | ICD-10-CM | POA: Diagnosis not present

## 2019-05-19 DIAGNOSIS — I2510 Atherosclerotic heart disease of native coronary artery without angina pectoris: Secondary | ICD-10-CM | POA: Diagnosis not present

## 2019-05-19 NOTE — Progress Notes (Signed)
Patient Stacey [name], female DOB:December 10, 1945, 73 y.o. LNL:[id number]  Chief Complaint  Patient presents with  . Shoulder Pain    Left not any better after injection      Stacey [name] is a 73 y.o. female who continues to have pain in the left shoulder.  The injection did not help.  She has no swelling or redness but continued pain.  I will get a MRI. She prefers to have it at [facility] as she is being treated there for other conditions.   Body mass index is 48.04 kg/m.  The patient meets the AMA guidelines for Morbid (severe) obesity with a BMI > 40.0 and I have recommended weight loss.   ROS  Review of Systems  Constitutional: Positive for activity change.  Respiratory: Positive for cough and shortness of breath.   Cardiovascular: Positive for chest pain.  Musculoskeletal: Positive for arthralgias, back pain, gait problem and joint swelling.  All other systems reviewed and are negative.   All other systems reviewed and are negative.  The following is a summary of the past history medically, past history surgically, known current medicines, social history and family history.  This information is gathered electronically by the computer from prior information and documentation.  I review this each visit and have found including this information at this point in the chart is beneficial and informative.    Past Medical History:  Diagnosis Date  . CAD in native artery    a.DES to ramus 2005 with late stent thrombosis 2006 tx with PTCA. 12/18 PCI/DES x1 to mRCA, EF 50-55%  . CHF (congestive heart failure) ([facility])   . Chronic pain   . COPD (chronic obstructive pulmonary disease) ([place])   . Hyperlipidemia   . Hypertension   . LBBB (left bundle branch block)   . Left bundle branch block   . Lymphedema   . Metastatic breast cancer ([place])    a. to bone.  . MI (myocardial infarction) ([place])   . Mild aortic stenosis 10/2017  . Morbid obesity ([place])   . PSVT (paroxysmal  supraventricular tachycardia) ([place])    a. per [facility] notes, seen on event monitor in 2014.  . Pulmonary nodules     Past Surgical History:  Procedure Laterality Date  . CORONARY STENT INTERVENTION N/A 11/26/2017   Procedure: CORONARY STENT INTERVENTION;  Surgeon: Martinique, Peter M, MD;  Location: [place];  Service: Cardiovascular;  Laterality: N/A;  . CORONARY STENT PLACEMENT    . LEFT HEART CATH AND CORONARY ANGIOGRAPHY N/A 11/26/2017   Procedure: LEFT HEART CATH AND CORONARY ANGIOGRAPHY;  Surgeon: Martinique, Peter M, MD;  Location: [place] [facility];  Service: Cardiovascular;  Laterality: N/A;    Family History  Problem Relation Age of Onset  . CAD Father 1  . Heart attack Father   . COPD Sister   . CAD Paternal Grandmother   . Sudden Cardiac Death Neg Hx     Social History Social History   Tobacco Use  . Smoking status: Former Smoker    Packs/day: 2.00    Years: 18.00    Pack years: 36.00    Types: Cigarettes    Quit date: 12/01/1980    Years since quitting: 38.4  . Smokeless tobacco: Never Used  Substance Use Topics  . Alcohol use: No    Frequency: Never  . Drug use: No    Allergies  Allergen Reactions  . Brilinta [Ticagrelor] Other (See Comments)    Nausea and severe  diarrhea- general weakness. Patient says does not want to take again  . Albuterol Other (See Comments)    Heart racing     Current Outpatient Medications  Medication Sig Dispense Refill  . acetaminophen (TYLENOL) 500 MG tablet Take 500 mg by mouth every 6 (six) hours as needed.    Marland Kitchen atorvastatin (LIPITOR) 80 MG tablet Take 1 tablet (80 mg total) by mouth every morning. 90 tablet 3  . b complex vitamins tablet Take 1 tablet by mouth every morning.     . budesonide (PULMICORT) 0.25 MG/2ML nebulizer solution Take 2 mLs (0.25 mg total) by nebulization 2 (two) times daily. 120 mL 11  . Calcium Citrate-Vitamin D (CITRACAL MAXIMUM) 315-250 MG-UNIT TABS Take 2 tablets by mouth 2 (two) times daily.     . clopidogrel (PLAVIX) 75 MG tablet Take 1 tablet (75 mg total) by mouth daily. 90 tablet 3  . furosemide (LASIX) 80 MG tablet Take 1 tablet (80 mg total) by mouth daily. 90 tablet 3  . gabapentin (NEURONTIN) 300 MG capsule Take 300 mg by mouth every morning.    . gabapentin (NEURONTIN) 400 MG capsule Take 800 mg by mouth at bedtime.    Marland Kitchen ipratropium (ATROVENT) 0.02 % nebulizer solution Take 2.5 mLs (0.5 mg total) by nebulization every 12 (twelve) hours. (Patient not taking: Reported on 05/05/2019) 75 mL 3  . letrozole (FEMARA) 2.5 MG tablet Take 2.5 mg by mouth at bedtime.     . levalbuterol (XOPENEX) 0.63 MG/3ML nebulizer solution Take 3 mLs (0.63 mg total) by nebulization every 8 (eight) hours as needed for wheezing or shortness of breath. (Patient not taking: Reported on 05/05/2019) 3 mL 3  . losartan (COZAAR) 100 MG tablet Take 1 tablet (100 mg total) by mouth daily. 90 tablet 3  . metoprolol succinate (TOPROL-XL) 100 MG 24 hr tablet TAKE 1 TABLET BY MOUTH DAILY. TAKE WITH OR IMMEDIATELY FOLLOWING A MEAL 90 tablet 3  . Multiple Vitamins-Minerals (EQ VISION FORMULA 50+ PO) Take 1 tablet by mouth every morning.    . Multiple Vitamins-Minerals (HAIR/SKIN/NAILS) CAPS Take 1 capsule by mouth every morning.    . nitroGLYCERIN (NITROSTAT) 0.4 MG SL tablet Place 1 tablet (0.4 mg total) under the tongue every 5 (five) minutes x 3 doses as needed for chest pain (if no relief after 3rd dose, proceed to the ED or call 911). 25 tablet 2  . Oxycodone HCl 10 MG TABS Take 10 mg by mouth every 4 (four) hours as needed (for pain).     . Potassium 99 MG TABS Take 2 tablets by mouth every morning.     Marland Kitchen spironolactone (ALDACTONE) 25 MG tablet Take 0.5 tablets (12.5 mg total) by mouth daily. 45 tablet 2  . Ubiquinol (QUNOL COQ10/UBIQUINOL/MEGA) 100 MG CAPS Take 1 capsule by mouth every morning. QUNOL-Ultra COQ10    . warfarin (COUMADIN) 1 MG tablet Take 1 tablet daily except 1 1/2 tablets on Wednesdays or as  directed by Coumadin clinic 40 tablet 3   No current facility-administered medications for this visit.      Physical Exam  Blood pressure 124/65, pulse 77, temperature (!) 97.4 F (36.3 C), height 5' (1.524 m), weight 246 lb (111.6 kg).  Constitutional: overall normal hygiene, normal nutrition, well developed, normal grooming, normal body habitus. Assistive device:walker  Musculoskeletal: gait and station Limp left, muscle tone and strength are normal, no tremors or atrophy is present.  .  Neurological: coordination overall normal.  Deep tendon reflex/nerve stretch intact.  Sensation normal.  Cranial nerves II-XII intact.   Skin:   Normal overall no scars, lesions, ulcers or rashes. No psoriasis.  Psychiatric: Alert and oriented x 3.  Recent memory intact, remote memory unclear.  Normal mood and affect. Well groomed.  Good eye contact.  Cardiovascular: overall no swelling, no varicosities, no edema bilaterally, normal temperatures of the legs and arms, no clubbing, cyanosis and good capillary refill.  Lymphatic: palpation is normal.  Left shoulder has decreased motion and pain, NV intact, grips normal, no redness.  All other systems reviewed and are negative   The patient has been educated about the nature of the problem(s) and counseled on treatment options.  The patient appeared to understand what I have discussed and is in agreement with it.  Encounter Diagnosis  Name Primary?  . Pain in joint of left shoulder Yes    PLAN Call if any problems.  Precautions discussed.  Continue current medications.   Return to clinic MRI at [facility], then back here.   Electronically Signed Sanjuana Kava, MD 6/18/202010:18 AM

## 2019-05-23 ENCOUNTER — Other Ambulatory Visit: Payer: Self-pay | Admitting: Cardiology

## 2019-05-24 ENCOUNTER — Telehealth: Payer: Self-pay | Admitting: Cardiology

## 2019-05-24 NOTE — Telephone Encounter (Signed)
Virtual Visit Pre-Appointment Phone Call  "(Name), I am calling you today to discuss your upcoming appointment. We are currently trying to limit exposure to the virus that causes COVID-19 by seeing patients at home rather than in the office."  1. "What is the BEST phone number to call the day of the visit?" - include this in appointment notes  2. Do you have or have access to (through a family member/friend) a smartphone with video capability that we can use for your visit?" a. If yes - list this number in appt notes as cell (if different from BEST phone #) and list the appointment type as a VIDEO visit in appointment notes b. If no - list the appointment type as a PHONE visit in appointment notes  3. Confirm consent - "In the setting of the current Covid19 crisis, you are scheduled for a (phone or video) visit with your provider on (date) at (time).  Just as we do with many in-office visits, in order for you to participate in this visit, we must obtain consent.  If you'd like, I can send this to your mychart (if signed up) or email for you to review.  Otherwise, I can obtain your verbal consent now.  All virtual visits are billed to your insurance company just like a normal visit would be.  By agreeing to a virtual visit, we'd like you to understand that the technology does not allow for your provider to perform an examination, and thus may limit your provider's ability to fully assess your condition. If your provider identifies any concerns that need to be evaluated in person, we will make arrangements to do so.  Finally, though the technology is pretty good, we cannot assure that it will always work on either your or our end, and in the setting of a video visit, we may have to convert it to a phone-only visit.  In either situation, we cannot ensure that we have a secure connection.  Are you willing to proceed?" STAFF: Did the patient verbally acknowledge consent to telehealth visit? Document  YES/NO here: yes  4. Advise patient to be prepared - "Two hours prior to your appointment, go ahead and check your blood pressure, pulse, oxygen saturation, and your weight (if you have the equipment to check those) and write them all down. When your visit starts, your provider will ask you for this information. If you have an Apple Watch or Kardia device, please plan to have heart rate information ready on the day of your appointment. Please have a pen and paper handy nearby the day of the visit as well."  5. Give patient instructions for MyChart download to smartphone OR [email or web address] as below if video visit (depending on what platform provider is using)  6. Inform patient they will receive a phone call 15 minutes prior to their appointment time (may be from unknown caller ID) so they should be prepared to answer    [place] has been deemed a candidate for a follow-up tele-health visit to limit community exposure during the Covid-19 pandemic. I spoke with the patient via phone to ensure availability of phone/video source, confirm preferred email & phone number, and discuss instructions and expectations.  I reminded [name] to be prepared with any vital sign and/or heart rhythm information that could potentially be obtained via home monitoring, at the time of her visit. I reminded [name] to expect a phone call prior to  her visit.  Weston Anna 05/24/2019 1:15 PM   INSTRUCTIONS FOR DOWNLOADING THE MYCHART APP TO SMARTPHONE  - The patient must first make sure to have activated MyChart and know their login information - If Apple, go to [employer] and type in MyChart in the search bar and download the app. If Android, ask patient to go to [employer] and type in [place] in the search bar and download the app. The app is free but as with any other app downloads, their phone may require them to verify saved payment information or  Apple/Android password.  - The patient will need to then log into the app with their MyChart username and password, and select [facility] as their healthcare provider to link the account. When it is time for your visit, go to the MyChart app, find appointments, and click Begin Video Visit. Be sure to Select Allow for your device to access the Microphone and Camera for your visit. You will then be connected, and your provider will be with you shortly.  **If they have any issues connecting, or need assistance please contact MyChart service desk [phone])**  **If using a computer, in order to ensure the best quality for their visit they will need to use either of the following Internet Browsers: [employer], or Google Chrome**  IF USING DOXIMITY or DOXY.ME - The patient will receive a link just prior to their visit by text.     FULL LENGTH CONSENT FOR TELE-HEALTH VISIT   I hereby voluntarily request, consent and authorize [facility] and its employed or contracted physicians, physician assistants, nurse practitioners or other licensed health care professionals (the Practitioner), to provide me with telemedicine health care services (the Services") as deemed necessary by the treating Practitioner. I acknowledge and consent to receive the Services by the Practitioner via telemedicine. I understand that the telemedicine visit will involve communicating with the Practitioner through live audiovisual communication technology and the disclosure of certain medical information by electronic transmission. I acknowledge that I have been given the opportunity to request an in-person assessment or other available alternative prior to the telemedicine visit and am voluntarily participating in the telemedicine visit.  I understand that I have the right to withhold or withdraw my consent to the use of telemedicine in the course of my care at any time, without affecting my right to future care  or treatment, and that the Practitioner or I may terminate the telemedicine visit at any time. I understand that I have the right to inspect all information obtained and/or recorded in the course of the telemedicine visit and may receive copies of available information for a reasonable fee.  I understand that some of the potential risks of receiving the Services via telemedicine include:   Delay or interruption in medical evaluation due to technological equipment failure or disruption;  Information transmitted may not be sufficient (e.g. poor resolution of images) to allow for appropriate medical decision making by the Practitioner; and/or   In rare instances, security protocols could fail, causing a breach of personal health information.  Furthermore, I acknowledge that it is my responsibility to provide information about my medical history, conditions and care that is complete and accurate to the best of my ability. I acknowledge that Practitioner's advice, recommendations, and/or decision may be based on factors not within their control, such as incomplete or inaccurate data provided by me or distortions of diagnostic images or specimens that may result from electronic transmissions. I  understand that the practice of medicine is not an exact science and that Practitioner makes no warranties or guarantees regarding treatment outcomes. I acknowledge that I will receive a copy of this consent concurrently upon execution via email to the email address I last provided but may also request a printed copy by calling the office of [facility].    I understand that my insurance will be billed for this visit.   I have read or had this consent read to me.  I understand the contents of this consent, which adequately explains the benefits and risks of the Services being provided via telemedicine.   I have been provided ample opportunity to ask questions regarding this consent and the Services and have had  my questions answered to my satisfaction.  I give my informed consent for the services to be provided through the use of telemedicine in my medical care  By participating in this telemedicine visit I agree to the above.

## 2019-05-24 NOTE — Telephone Encounter (Signed)
Virtual Visit Pre-Appointment Phone Call  "(Name), I am calling you today to discuss your upcoming appointment. We are currently trying to limit exposure to the virus that causes COVID-19 by seeing patients at home rather than in the office."  1. "What is the BEST phone number to call the day of the visit?" - include this in appointment notes  2. Do you have or have access to (through a family member/friend) a smartphone with video capability that we can use for your visit?" a. If yes - list this number in appt notes as cell (if different from BEST phone #) and list the appointment type as a VIDEO visit in appointment notes b. If no - list the appointment type as a PHONE visit in appointment notes  3. Confirm consent - "In the setting of the current Covid19 crisis, you are scheduled for a (phone or video) visit with your provider on (date) at (time).  Just as we do with many in-office visits, in order for you to participate in this visit, we must obtain consent.  If you'd like, I can send this to your mychart (if signed up) or email for you to review.  Otherwise, I can obtain your verbal consent now.  All virtual visits are billed to your insurance company just like a normal visit would be.  By agreeing to a virtual visit, we'd like you to understand that the technology does not allow for your provider to perform an examination, and thus may limit your provider's ability to fully assess your condition. If your provider identifies any concerns that need to be evaluated in person, we will make arrangements to do so.  Finally, though the technology is pretty good, we cannot assure that it will always work on either your or our end, and in the setting of a video visit, we may have to convert it to a phone-only visit.  In either situation, we cannot ensure that we have a secure connection.  Are you willing to proceed?" STAFF: Did the patient verbally acknowledge consent to telehealth visit? Document  YES/NO here: yes  4. Advise patient to be prepared - "Two hours prior to your appointment, go ahead and check your blood pressure, pulse, oxygen saturation, and your weight (if you have the equipment to check those) and write them all down. When your visit starts, your provider will ask you for this information. If you have an Apple Watch or Kardia device, please plan to have heart rate information ready on the day of your appointment. Please have a pen and paper handy nearby the day of the visit as well."  5. Give patient instructions for MyChart download to smartphone OR Doximity/Doxy.me as below if video visit (depending on what platform provider is using)  6. Inform patient they will receive a phone call 15 minutes prior to their appointment time (may be from unknown caller ID) so they should be prepared to answer    [place] has been deemed a candidate for a follow-up tele-health visit to limit community exposure during the Covid-19 pandemic. I spoke with the patient via phone to ensure availability of phone/video source, confirm preferred email & phone number, and discuss instructions and expectations.  I reminded [name] to be prepared with any vital sign and/or heart rhythm information that could potentially be obtained via home monitoring, at the time of her visit. I reminded [name] to expect a phone call prior to  her visit.  Weston Anna 05/24/2019 1:15 PM   INSTRUCTIONS FOR DOWNLOADING THE MYCHART APP TO SMARTPHONE  - The patient must first make sure to have activated MyChart and know their login information - If Apple, go to [employer] and type in MyChart in the search bar and download the app. If Android, ask patient to go to [employer] and type in [place] in the search bar and download the app. The app is free but as with any other app downloads, their phone may require them to verify saved payment information or  Apple/Android password.  - The patient will need to then log into the app with their MyChart username and password, and select [facility] as their healthcare provider to link the account. When it is time for your visit, go to the MyChart app, find appointments, and click Begin Video Visit. Be sure to Select Allow for your device to access the Microphone and Camera for your visit. You will then be connected, and your provider will be with you shortly.  **If they have any issues connecting, or need assistance please contact MyChart service desk [phone])**  **If using a computer, in order to ensure the best quality for their visit they will need to use either of the following Internet Browsers: [employer], or Google Chrome**  IF USING DOXIMITY or DOXY.ME - The patient will receive a link just prior to their visit by text.     FULL LENGTH CONSENT FOR TELE-HEALTH VISIT   I hereby voluntarily request, consent and authorize [facility] and its employed or contracted physicians, physician assistants, nurse practitioners or other licensed health care professionals (the Practitioner), to provide me with telemedicine health care services (the Services") as deemed necessary by the treating Practitioner. I acknowledge and consent to receive the Services by the Practitioner via telemedicine. I understand that the telemedicine visit will involve communicating with the Practitioner through live audiovisual communication technology and the disclosure of certain medical information by electronic transmission. I acknowledge that I have been given the opportunity to request an in-person assessment or other available alternative prior to the telemedicine visit and am voluntarily participating in the telemedicine visit.  I understand that I have the right to withhold or withdraw my consent to the use of telemedicine in the course of my care at any time, without affecting my right to future care  or treatment, and that the Practitioner or I may terminate the telemedicine visit at any time. I understand that I have the right to inspect all information obtained and/or recorded in the course of the telemedicine visit and may receive copies of available information for a reasonable fee.  I understand that some of the potential risks of receiving the Services via telemedicine include:   Delay or interruption in medical evaluation due to technological equipment failure or disruption;  Information transmitted may not be sufficient (e.g. poor resolution of images) to allow for appropriate medical decision making by the Practitioner; and/or   In rare instances, security protocols could fail, causing a breach of personal health information.  Furthermore, I acknowledge that it is my responsibility to provide information about my medical history, conditions and care that is complete and accurate to the best of my ability. I acknowledge that Practitioner's advice, recommendations, and/or decision may be based on factors not within their control, such as incomplete or inaccurate data provided by me or distortions of diagnostic images or specimens that may result from electronic transmissions. I  understand that the practice of medicine is not an exact science and that Practitioner makes no warranties or guarantees regarding treatment outcomes. I acknowledge that I will receive a copy of this consent concurrently upon execution via email to the email address I last provided but may also request a printed copy by calling the office of [place].    I understand that my insurance will be billed for this visit.   I have read or had this consent read to me.  I understand the contents of this consent, which adequately explains the benefits and risks of the Services being provided via telemedicine.   I have been provided ample opportunity to ask questions regarding this consent and the Services and have had  my questions answered to my satisfaction.  I give my informed consent for the services to be provided through the use of telemedicine in my medical care  By participating in this telemedicine visit I agree to the above.

## 2019-05-25 ENCOUNTER — Ambulatory Visit (INDEPENDENT_AMBULATORY_CARE_PROVIDER_SITE_OTHER): Payer: Medicare Other | Admitting: *Deleted

## 2019-05-25 DIAGNOSIS — I4891 Unspecified atrial fibrillation: Secondary | ICD-10-CM | POA: Diagnosis not present

## 2019-05-25 DIAGNOSIS — Z5181 Encounter for therapeutic drug level monitoring: Secondary | ICD-10-CM | POA: Diagnosis not present

## 2019-05-25 LAB — POCT INR
INR: 3.5 — AB (ref 2.0–3.0)
INR: 3.5 — AB (ref 2.0–3.0)

## 2019-05-25 NOTE — Patient Instructions (Signed)
Hold coumadin tonight then decrease dose to 1 tablet daily Recheck INR in 4 weeks

## 2019-05-31 ENCOUNTER — Telehealth (INDEPENDENT_AMBULATORY_CARE_PROVIDER_SITE_OTHER): Payer: Medicare Other | Admitting: Cardiology

## 2019-05-31 ENCOUNTER — Encounter: Payer: Self-pay | Admitting: Cardiology

## 2019-05-31 VITALS — Ht 60.0 in | Wt 251.0 lb

## 2019-05-31 DIAGNOSIS — I4891 Unspecified atrial fibrillation: Secondary | ICD-10-CM

## 2019-05-31 DIAGNOSIS — I5022 Chronic systolic (congestive) heart failure: Secondary | ICD-10-CM

## 2019-05-31 DIAGNOSIS — I2510 Atherosclerotic heart disease of native coronary artery without angina pectoris: Secondary | ICD-10-CM

## 2019-05-31 NOTE — Progress Notes (Signed)
Virtual Visit via Telephone Note   This visit type was conducted due to national recommendations for restrictions regarding the COVID-19 Pandemic (e.g. social distancing) in an effort to limit this patient's exposure and mitigate transmission in our community.  Due to her co-morbid illnesses, this patient is at least at moderate risk for complications without adequate follow up.  This format is felt to be most appropriate for this patient at this time.  The patient did not have access to video technology/had technical difficulties with video requiring transitioning to audio format only (telephone).  All issues noted in this document were discussed and addressed.  No physical exam could be performed with this format.  Please refer to the patient's chart for her  consent to telehealth for [facility].   Date:  05/31/2019   ID:  Stacey [name], DOB 01-20-1946, MRN [id number]  Patient Location: Home Provider Location: Office  PCP:  Sinda Du, MD  Cardiologist:  Carlyle Dolly, MD  Electrophysiologist:  None   Evaluation Performed:  Follow-Up Visit  Chief Complaint:  Follow up visit  History of Present Illness:    Stacey [name] is a 73 y.o. female seen today for follow up of the following medical problems. This is a focused visit on her history of chronic systolic HF and CAD   1. Chronic systolic HF/ICM - Jan 2993 echo LVEF 20-25%, restrictive diastolic function, mild AS, PASP 40  - 10/2017 echo LVEF 50%, dropped to 30-35% Jan 2019 and has remained decreased  - chronic left leg edema thought to be venous insufficiency  From prior cardiologist: "She is not a candidate for device therapy given her metastatic cancer and numerous comorbidities and poor long-term prognosis. She has been scheduled to establish care with the palliative care team which I think is quite appropriate".   - last visit she started aldactone. Repeat labs showed stable K and Cr - she has  concern about the cost of entresto, we have not changed - compliant with meds. Does not check bp at home, no machine.  - no SOb/DOE. Chronic LE edema unchanged. Weights are stable 253 lbs   - weights stable around 251 to 253 lbs. Swelling in legs is up and down.  - compliant with meds - has been hesitant to change to entresto due to concern about cost    2. CAD - notesincidate history of DES to ramus, late stentthrombosis requiring PTCA in 2006.DES to RCA in Dec 2018 - SOB on brillinta, previously changed to plavix.Has been on plavix and coumadin.  - episode last month with laying flat of chest discomfort, no exertional symptoms. No significant recurrence   3. Mild aortic stenosis - Jan 2020 echo mean grad 11, AVA VTI 1.69  4. Chronic LBBB   5. . Metastatic breast cancer - followed at [facility]. Metastatic breast cancer to the bone and renal lesion, has pulmonary nodules. On radiation. - has been seen by palliative care previously     6. Afib - noted during Jan 2020 admission, new diagnosis - managed with Toprol, started on eliquis.NO evidence of brain mets by 10/2018 CT.  - eliquis is too expensive, changed to coumadin.  - denies any palpitations      The patient does not have symptoms concerning for COVID-19 infection (fever, chills, cough, or new shortness of breath).    Past Medical History:  Diagnosis Date  . CAD in native artery    a.DES to ramus 2005 with late stent thrombosis 2006 tx  with PTCA. 12/18 PCI/DES x1 to mRCA, EF 50-55%  . CHF (congestive heart failure) ([place])   . Chronic pain   . COPD (chronic obstructive pulmonary disease) ([place])   . Hyperlipidemia   . Hypertension   . LBBB (left bundle [place] block)   . Left bundle [place] block   . Lymphedema   . Metastatic breast cancer ([place])    a. to bone.  . MI (myocardial infarction) ([place])   . Mild aortic stenosis 10/2017  . Morbid obesity ([place])   . PSVT (paroxysmal supraventricular  tachycardia) ([place])    a. per [facility] notes, seen on event monitor in 2014.  . Pulmonary nodules    Past Surgical History:  Procedure Laterality Date  . CORONARY STENT INTERVENTION N/A 11/26/2017   Procedure: CORONARY STENT INTERVENTION;  Surgeon: Martinique, Peter M, MD;  Location: [place] [facility];  Service: Cardiovascular;  Laterality: N/A;  . CORONARY STENT PLACEMENT    . LEFT HEART CATH AND CORONARY ANGIOGRAPHY N/A 11/26/2017   Procedure: LEFT HEART CATH AND CORONARY ANGIOGRAPHY;  Surgeon: Martinique, Peter M, MD;  Location: [place] [facility];  Service: Cardiovascular;  Laterality: N/A;     No outpatient medications have been marked as taking for the 05/31/19 encounter (Appointment) with Arnoldo Lenis, MD.     Allergies:   Brilinta [ticagrelor] and Albuterol   Social History   Tobacco Use  . Smoking status: Former Smoker    Packs/day: 2.00    Years: 18.00    Pack years: 36.00    Types: Cigarettes    Quit date: 12/01/1980    Years since quitting: 38.5  . Smokeless tobacco: Never Used  Substance Use Topics  . Alcohol use: No    Frequency: Never  . Drug use: No     Family Hx: The patient's family history includes CAD in her paternal grandmother; CAD (age of onset: 9) in her father; COPD in her sister; Heart attack in her father. There is no history of Sudden Cardiac Death.  ROS:   Please see the history of present illness.     All other systems reviewed and are negative.   Prior CV studies:   The following studies were reviewed today:  10/2017 cath  Ost Ramus to Ramus lesion is 100% stenosed.  Mid RCA lesion is 90% stenosed.  A drug-eluting stent was successfully placed using a STENT SYNERGY DES 2.5X16.  Post intervention, there is a 0% residual stenosis.  LV end diastolic pressure is normal.  1. 2 vessel obstructive CAD - 100% proximal ramus intermediate at site of prior stent. I think this is a CTO and she has good collaterals.  - 90% ulcerative mid  RCA 2. Normal LVEDP 3. Successful PCI of the mid RCA with DES   10/2017 echo Study Conclusions  - Left ventricle: The cavity size was normal. There was mild concentric hypertrophy. Systolic function was normal. The estimated ejection fraction was in the range of 50% to 55%. There was an increased relative contribution of atrial contraction to ventricular filling. Doppler parameters are consistent with abnormal left ventricular relaxation (grade 1 diastolic dysfunction). - Aortic valve: There was mild stenosis. Valve area (VTI): 1.66 cm^2. Valve area (Vmax): 1.71 cm^2. Valve area (Vmean): 1.62 cm^2. - Mitral valve: Calcified annulus. Mildly thickened leaflets . - Pulmonary arteries: PA peak pressure: 32 mm Hg (S).  Jan 2019 echo Study Conclusions  - Left ventricle: The cavity size was normal. There was mild concentric hypertrophy. Systolic function was  moderately to severely reduced. The estimated ejection fraction was in the range of 30% to 35%. Diffuse hypokinesis. Doppler parameters are consistent with abnormal left ventricular relaxation (grade 1 diastolic dysfunction). - Aortic valve: Probably trileaflet; moderately thickened, severely calcified leaflets. There was mild stenosis. Peak gradient (S): 13 mm Hg. Valve area (Vmax): 1.26 cm^2. - Mitral valve: Calcified annulus. Mildly thickened leaflets . There was mild regurgitation. - Right ventricle: The cavity size was normal. Wall thickness was normal. Systolic function was normal. - Right atrium: The atrium was normal in size. - Pulmonary arteries: Systolic pressure could not be accurately estimated. - Inferior vena cava: The vessel was normal in size. - Pericardium, extracardiac: There was no pericardial effusion.  Impressions:  - Since the last study on 11/24/17 LVEF has decreased from 50% to 30-35% with diffuse hypokinesis.  Jan 2020 echo Study Conclusions  -  Left ventricle: The cavity size was normal. Wall thickness was normal. Systolic function was severely reduced. The estimated ejection fraction was in the range of 20% to 25%. Diffuse hypokinesis. Doppler parameters are consistent with restrictive physiology, indicative of decreased left ventricular diastolic compliance and/or increased left atrial pressure. Doppler parameters are consistent with high ventricular filling pressure. - Aortic valve: There was mild stenosis. Valve area (VTI): 1.69 cm^2. Valve area (Vmax): 1.73 cm^2. Valve area (Vmean): 1.74 cm^2. - Mitral valve: Calcified annulus. There was mild regurgitation. - Left atrium: The atrium was mildly dilated. - Atrial septum: No defect or patent foramen ovale was identified. - Tricuspid valve: There was mild regurgitation. - Pulmonary arteries: PA peak pressure: 40 mm Hg (S).  Labs/Other Tests and Data Reviewed:    EKG:  No ECG reviewed.  Recent Labs: 11/13/2018: ALT 31 11/30/2018: B Natriuretic Peptide 552.6; Hemoglobin 12.3; Platelets 186; TSH 1.434 12/01/2018: Magnesium 2.0 02/04/2019: BUN 25; Creatinine, Ser 0.75; Potassium 4.6; Sodium 140   Recent Lipid Panel No results found for: CHOL, TRIG, HDL, CHOLHDL, LDLCALC, LDLDIRECT  Wt Readings from Last 3 Encounters:  05/19/19 246 lb (111.6 kg)  05/05/19 246 lb (111.6 kg)  03/30/19 253 lb (114.8 kg)     Objective:    Vital Signs:   Today's Vitals   05/31/19 1115  Weight: 251 lb (113.9 kg)  Height: 5' (1.524 m)   Body mass index is 49.02 kg/m.  Normal affect. Normal speech pattern and tone. No audible signs of SOB or wheezing. Comfortable, no apparent distress.   ASSESSMENT & PLAN:    1. Chronic systolic HF - no recent symptoms - she is resistant to change to entrsto due to cost, continue losartan - continue current meds   2. CAD - no symptoms, continue current meds - on coumadin and plavix due to afib and history of late stent thrombosis    3. PAF - DOACs too expensive, she was changed to coumadin - no recent symptoms, continue current meds     COVID-19 Education: The signs and symptoms of COVID-19 were discussed with the patient and how to seek care for testing (follow up with PCP or arrange E-visit).  The importance of social distancing was discussed today.  Time:   Today, I have spent 14 minutes with the patient with telehealth technology discussing the above problems.     Medication Adjustments/Labs and Tests Ordered: Current medicines are reviewed at length with the patient today.  Concerns regarding medicines are outlined above.   Tests Ordered: No orders of the defined types were placed in this encounter.   Medication Changes: No  orders of the defined types were placed in this encounter.   Follow Up:  In Person in 3 month(s)  Signed, Carlyle Dolly, MD  05/31/2019 8:37 AM    [place]

## 2019-05-31 NOTE — Patient Instructions (Signed)
Your physician recommends that you schedule a follow-up appointment in: 3 MONTHS WITH DR BRANCH  Your physician recommends that you continue on your current medications as directed. Please refer to the Current Medication list given to you today.  Thank you for choosing [facility]!!    

## 2019-06-02 ENCOUNTER — Encounter: Payer: Self-pay | Admitting: Orthopaedic Surgery

## 2019-06-02 ENCOUNTER — Ambulatory Visit: Payer: Medicare Other | Admitting: Orthopaedic Surgery

## 2019-06-07 ENCOUNTER — Other Ambulatory Visit: Payer: Self-pay

## 2019-06-07 ENCOUNTER — Encounter: Payer: Self-pay | Admitting: Orthopaedic Surgery

## 2019-06-07 ENCOUNTER — Ambulatory Visit (INDEPENDENT_AMBULATORY_CARE_PROVIDER_SITE_OTHER): Payer: Medicare Other | Admitting: Orthopaedic Surgery

## 2019-06-07 VITALS — BP 133/59 | HR 80 | Temp 97.2°F | Ht 60.0 in | Wt 246.0 lb

## 2019-06-07 DIAGNOSIS — M25512 Pain in left shoulder: Secondary | ICD-10-CM

## 2019-06-07 DIAGNOSIS — I2510 Atherosclerotic heart disease of native coronary artery without angina pectoris: Secondary | ICD-10-CM

## 2019-06-07 NOTE — Progress Notes (Signed)
Patient HD:[id number] Stacey [name], female DOB:08-12-1946, 73 y.o. [id number]  Chief Complaint  Patient presents with  . Shoulder Pain    Left shoulder pain.    HPI  [name] is a 73 y.o. female who has left shoulder pain. She went to [facility] and had MRI of the left shoulder which showed supraspinatus small high-grade partial-thickness insertional tear; infraspinatus tendinosis with possible tiny low-grade partial-thickness insertional undersurface tear; effusion small; and adhesive capsulitis could be present.  I have explained the findings to her.  I will have her begin PT in [place].  She is to continue present medicine.   Body mass index is 48.04 kg/m.  The patient meets the AMA guidelines for Morbid (severe) obesity with a BMI > 40.0 and I have recommended weight loss.   ROS  Review of Systems  All other systems reviewed and are negative.  The following is a summary of the past history medically, past history surgically, known current medicines, social history and family history.  This information is gathered electronically by the computer from prior information and documentation.  I review this each visit and have found including this information at this point in the chart is beneficial and informative.    Past Medical History:  Diagnosis Date  . CAD in native artery    a.DES to ramus 2005 with late stent thrombosis 2006 tx with PTCA. 12/18 PCI/DES x1 to mRCA, EF 50-55%  . CHF (congestive heart failure) ([place])   . Chronic pain   . COPD (chronic obstructive pulmonary disease) ([place])   . Hyperlipidemia   . Hypertension   . LBBB (left bundle branch block)   . Left bundle branch block   . Lymphedema   . Metastatic breast cancer ([place])    a. to bone.  . MI (myocardial infarction) ([place])   . Mild aortic stenosis 10/2017  . Morbid obesity ([place])   . PSVT (paroxysmal supraventricular tachycardia) ([place])    a. per [facility] notes, seen on event monitor in 2014.  . Pulmonary  nodules     Past Surgical History:  Procedure Laterality Date  . CORONARY STENT INTERVENTION N/A 11/26/2017   Procedure: CORONARY STENT INTERVENTION;  Surgeon: Martinique, Peter M, MD;  Location: [place] [facility];  Service: Cardiovascular;  Laterality: N/A;  . CORONARY STENT PLACEMENT    . LEFT HEART CATH AND CORONARY ANGIOGRAPHY N/A 11/26/2017   Procedure: LEFT HEART CATH AND CORONARY ANGIOGRAPHY;  Surgeon: Martinique, Peter M, MD;  Location: [place] [facility];  Service: Cardiovascular;  Laterality: N/A;    Family History  Problem Relation Age of Onset  . CAD Father 58  . Heart attack Father   . COPD Sister   . CAD Paternal Grandmother   . Sudden Cardiac Death Neg Hx     Social History Social History   Tobacco Use  . Smoking status: Former Smoker    Packs/day: 2.00    Years: 18.00    Pack years: 36.00    Types: Cigarettes    Quit date: 12/01/1980    Years since quitting: 38.5  . Smokeless tobacco: Never Used  Substance Use Topics  . Alcohol use: No    Frequency: Never  . Drug use: No    Allergies  Allergen Reactions  . Brilinta [Ticagrelor] Other (See Comments)    Nausea and severe diarrhea- general weakness. Patient says does not want to take again  . Albuterol Other (See Comments)    Heart racing     Current  Outpatient Medications  Medication Sig Dispense Refill  . acetaminophen (TYLENOL) 500 MG tablet Take 500 mg by mouth every 6 (six) hours as needed.    Marland Kitchen atorvastatin (LIPITOR) 80 MG tablet Take 1 tablet (80 mg total) by mouth every morning. 90 tablet 3  . b complex vitamins tablet Take 1 tablet by mouth every morning.     . budesonide (PULMICORT) 0.25 MG/2ML nebulizer solution Take 2 mLs (0.25 mg total) by nebulization 2 (two) times daily. 120 mL 11  . Calcium Citrate-Vitamin D (CITRACAL MAXIMUM) 315-250 MG-UNIT TABS Take 2 tablets by mouth 2 (two) times daily.    . clopidogrel (PLAVIX) 75 MG tablet Take 1 tablet (75 mg total) by mouth daily. 90 tablet 3  .  furosemide (LASIX) 80 MG tablet Take 1 tablet (80 mg total) by mouth daily. 90 tablet 3  . gabapentin (NEURONTIN) 300 MG capsule Take 300 mg by mouth every morning.    . gabapentin (NEURONTIN) 400 MG capsule Take 800 mg by mouth at bedtime.    Marland Kitchen ipratropium (ATROVENT) 0.02 % nebulizer solution Take 2.5 mLs (0.5 mg total) by nebulization every 12 (twelve) hours. 75 mL 3  . letrozole (FEMARA) 2.5 MG tablet Take 2.5 mg by mouth at bedtime.     Marland Kitchen losartan (COZAAR) 100 MG tablet Take 1 tablet (100 mg total) by mouth daily. 90 tablet 3  . metoprolol succinate (TOPROL-XL) 100 MG 24 hr tablet TAKE 1 TABLET BY MOUTH DAILY. TAKE WITH OR IMMEDIATELY FOLLOWING A MEAL 90 tablet 3  . Multiple Vitamins-Minerals (EQ VISION FORMULA 50+ PO) Take 1 tablet by mouth every morning.    . Multiple Vitamins-Minerals (HAIR/SKIN/NAILS) CAPS Take 1 capsule by mouth every morning.    . nitroGLYCERIN (NITROSTAT) 0.4 MG SL tablet Place 1 tablet (0.4 mg total) under the tongue every 5 (five) minutes x 3 doses as needed for chest pain (if no relief after 3rd dose, proceed to the ED or call 911). 25 tablet 2  . Oxycodone HCl 10 MG TABS Take 10 mg by mouth every 4 (four) hours as needed (for pain).     . Potassium 99 MG TABS Take 2 tablets by mouth every morning.     Marland Kitchen spironolactone (ALDACTONE) 25 MG tablet TAKE 1/2 TABLET(12.5 MG) BY MOUTH DAILY 45 tablet 2  . Ubiquinol (QUNOL COQ10/UBIQUINOL/MEGA) 100 MG CAPS Take 1 capsule by mouth every morning. QUNOL-Ultra COQ10    . warfarin (COUMADIN) 1 MG tablet Take 1 tablet daily except 1 1/2 tablets on Wednesdays or as directed by Coumadin clinic 40 tablet 3   No current facility-administered medications for this visit.      Physical Exam  Blood pressure (!) 133/59, pulse 80, temperature (!) 97.2 F (36.2 C), height 5' (1.524 m), weight 246 lb (111.6 kg).  Constitutional: overall normal hygiene, normal nutrition, well developed, normal grooming, normal body habitus. Assistive  device:walker  Musculoskeletal: gait and station Limp right, muscle tone and strength are normal, no tremors or atrophy is present.  .  Neurological: coordination overall normal.  Deep tendon reflex/nerve stretch intact.  Sensation normal.  Cranial nerves II-XII intact.   Skin:   Normal overall no scars, lesions, ulcers or rashes. No psoriasis.  Psychiatric: Alert and oriented x 3.  Recent memory intact, remote memory unclear.  Normal mood and affect. Well groomed.  Good eye contact.  Cardiovascular: overall no swelling, no varicosities, no edema bilaterally, normal temperatures of the legs and arms, no clubbing, cyanosis and good capillary  refill.  Lymphatic: palpation is normal.  Left shoulder has painful ROM limited secondary to pain, no effusion, NV intact.  Grips normal.  Neck negative. All other systems reviewed and are negative   The patient has been educated about the nature of the problem(s) and counseled on treatment options.  The patient appeared to understand what I have discussed and is in agreement with it.  Encounter Diagnosis  Name Primary?  . Pain in joint of left shoulder Yes    PLAN Call if any problems.  Precautions discussed.  Continue current medications.   Return to clinic 3 weeks   Begin PT in [place].  Electronically Signed Sanjuana Kava, MD 7/7/202011:55 AM

## 2019-06-23 ENCOUNTER — Other Ambulatory Visit: Payer: Self-pay

## 2019-06-23 ENCOUNTER — Ambulatory Visit (INDEPENDENT_AMBULATORY_CARE_PROVIDER_SITE_OTHER): Payer: Medicare Other | Admitting: *Deleted

## 2019-06-23 DIAGNOSIS — I4891 Unspecified atrial fibrillation: Secondary | ICD-10-CM

## 2019-06-23 DIAGNOSIS — Z5181 Encounter for therapeutic drug level monitoring: Secondary | ICD-10-CM | POA: Diagnosis not present

## 2019-06-23 LAB — POCT INR: INR: 3.7 — AB (ref 2.0–3.0)

## 2019-06-23 NOTE — Patient Instructions (Signed)
Hold coumadin tonight then decrease dose to 1 tablet daily except 1/2 tablet on Tuesdays and Saturdays Recheck INR in 4 weeks

## 2019-06-28 ENCOUNTER — Ambulatory Visit: Payer: Medicare Other | Admitting: Orthopaedic Surgery

## 2019-06-30 ENCOUNTER — Other Ambulatory Visit: Payer: Self-pay

## 2019-06-30 ENCOUNTER — Ambulatory Visit (INDEPENDENT_AMBULATORY_CARE_PROVIDER_SITE_OTHER): Payer: Medicare Other | Admitting: Orthopaedic Surgery

## 2019-06-30 ENCOUNTER — Encounter: Payer: Self-pay | Admitting: Orthopaedic Surgery

## 2019-06-30 ENCOUNTER — Ambulatory Visit: Payer: Medicare Other

## 2019-06-30 VITALS — BP 145/72 | HR 75 | Temp 98.3°F | Ht 60.0 in | Wt 246.0 lb

## 2019-06-30 DIAGNOSIS — I2510 Atherosclerotic heart disease of native coronary artery without angina pectoris: Secondary | ICD-10-CM

## 2019-06-30 DIAGNOSIS — M25512 Pain in left shoulder: Secondary | ICD-10-CM

## 2019-06-30 DIAGNOSIS — M25551 Pain in right hip: Secondary | ICD-10-CM | POA: Diagnosis not present

## 2019-06-30 DIAGNOSIS — F4321 Adjustment disorder with depressed mood: Secondary | ICD-10-CM | POA: Insufficient documentation

## 2019-06-30 NOTE — Progress Notes (Signed)
Patient Stacey [name], female DOB:1946-11-22, 73 y.o. BSJ:[id number]  Chief Complaint  Patient presents with  . Shoulder Pain    left     HPI  Stacey [name] is a 73 y.o. female who has left shoulder pain.  She has been going to PT and is improving.  I have read the PT notes.  She is doing her exercises at home.  She is having more pain in the right hip.  She has pain when she first stands.  She has no trauma. She is using her walker more.  She has no numbness, no weakness.   Body mass index is 48.04 kg/m.  The patient meets the AMA guidelines for Morbid (severe) obesity with a BMI > 40.0 and I have recommended weight loss.   ROS  Review of Systems  Constitutional: Positive for activity change.  Respiratory: Positive for cough and shortness of breath.   Cardiovascular: Positive for chest pain.  Musculoskeletal: Positive for arthralgias, back pain, gait problem and joint swelling.  All other systems reviewed and are negative.   All other systems reviewed and are negative.  The following is a summary of the past history medically, past history surgically, known current medicines, social history and family history.  This information is gathered electronically by the computer from prior information and documentation.  I review this each visit and have found including this information at this point in the chart is beneficial and informative.    Past Medical History:  Diagnosis Date  . CAD in native artery    a.DES to ramus 2005 with late stent thrombosis 2006 tx with PTCA. 12/18 PCI/DES x1 to mRCA, EF 50-55%  . CHF (congestive heart failure) ([place])   . Chronic pain   . COPD (chronic obstructive pulmonary disease) ([place])   . Hyperlipidemia   . Hypertension   . LBBB (left bundle branch block)   . Left bundle branch block   . Lymphedema   . Metastatic breast cancer ([place])    a. to bone.  . MI (myocardial infarction) ([place])   . Mild aortic stenosis 10/2017  . Morbid  obesity ([place])   . PSVT (paroxysmal supraventricular tachycardia) ([place])    a. per [facility] notes, seen on event monitor in 2014.  . Pulmonary nodules     Past Surgical History:  Procedure Laterality Date  . CORONARY STENT INTERVENTION N/A 11/26/2017   Procedure: CORONARY STENT INTERVENTION;  Surgeon: Martinique, Peter M, MD;  Location: [place] [facility];  Service: Cardiovascular;  Laterality: N/A;  . CORONARY STENT PLACEMENT    . LEFT HEART CATH AND CORONARY ANGIOGRAPHY N/A 11/26/2017   Procedure: LEFT HEART CATH AND CORONARY ANGIOGRAPHY;  Surgeon: Martinique, Peter M, MD;  Location: [place] [facility];  Service: Cardiovascular;  Laterality: N/A;    Family History  Problem Relation Age of Onset  . CAD Father 24  . Heart attack Father   . COPD Sister   . CAD Paternal Grandmother   . Sudden Cardiac Death Neg Hx     Social History Social History   Tobacco Use  . Smoking status: Former Smoker    Packs/day: 2.00    Years: 18.00    Pack years: 36.00    Types: Cigarettes    Quit date: 12/01/1980    Years since quitting: 38.6  . Smokeless tobacco: Never Used  Substance Use Topics  . Alcohol use: No    Frequency: Never  . Drug use: No    Allergies  Allergen Reactions  . Brilinta [Ticagrelor] Other (See Comments)    Nausea and severe diarrhea- general weakness. Patient says does not want to take again  . Albuterol Other (See Comments)    Heart racing     Current Outpatient Medications  Medication Sig Dispense Refill  . acetaminophen (TYLENOL) 500 MG tablet Take 500 mg by mouth every 6 (six) hours as needed.    Marland Kitchen atorvastatin (LIPITOR) 80 MG tablet Take 1 tablet (80 mg total) by mouth every morning. 90 tablet 3  . b complex vitamins tablet Take 1 tablet by mouth every morning.     . budesonide (PULMICORT) 0.25 MG/2ML nebulizer solution Take 2 mLs (0.25 mg total) by nebulization 2 (two) times daily. 120 mL 11  . Calcium Citrate-Vitamin D (CITRACAL MAXIMUM) 315-250 MG-UNIT TABS Take 2  tablets by mouth 2 (two) times daily.    . clopidogrel (PLAVIX) 75 MG tablet Take 1 tablet (75 mg total) by mouth daily. 90 tablet 3  . furosemide (LASIX) 80 MG tablet Take 1 tablet (80 mg total) by mouth daily. 90 tablet 3  . gabapentin (NEURONTIN) 300 MG capsule Take 300 mg by mouth every morning.    . gabapentin (NEURONTIN) 400 MG capsule Take 800 mg by mouth at bedtime.    Marland Kitchen ipratropium (ATROVENT) 0.02 % nebulizer solution Take 2.5 mLs (0.5 mg total) by nebulization every 12 (twelve) hours. 75 mL 3  . letrozole (FEMARA) 2.5 MG tablet Take 2.5 mg by mouth at bedtime.     . lidocaine-prilocaine (EMLA) cream Apply topically.    Marland Kitchen losartan (COZAAR) 100 MG tablet Take 1 tablet (100 mg total) by mouth daily. 90 tablet 3  . metoprolol succinate (TOPROL-XL) 100 MG 24 hr tablet TAKE 1 TABLET BY MOUTH DAILY. TAKE WITH OR IMMEDIATELY FOLLOWING A MEAL 90 tablet 3  . Multiple Vitamins-Minerals (EQ VISION FORMULA 50+ PO) Take 1 tablet by mouth every morning.    . Multiple Vitamins-Minerals (HAIR/SKIN/NAILS) CAPS Take 1 capsule by mouth every morning.    . nitroGLYCERIN (NITROSTAT) 0.4 MG SL tablet Place 1 tablet (0.4 mg total) under the tongue every 5 (five) minutes x 3 doses as needed for chest pain (if no relief after 3rd dose, proceed to the ED or call 911). 25 tablet 2  . oxyCODONE (OXY IR/ROXICODONE) 5 MG immediate release tablet Take by mouth.    . Potassium 99 MG TABS Take 2 tablets by mouth every morning.     Marland Kitchen spironolactone (ALDACTONE) 25 MG tablet TAKE 1/2 TABLET(12.5 MG) BY MOUTH DAILY 45 tablet 2  . Ubiquinol (QUNOL COQ10/UBIQUINOL/MEGA) 100 MG CAPS Take 1 capsule by mouth every morning. QUNOL-Ultra COQ10    . warfarin (COUMADIN) 1 MG tablet Take 1 tablet daily except 1 1/2 tablets on Wednesdays or as directed by Coumadin clinic 40 tablet 3  . DULoxetine (CYMBALTA) 30 MG capsule     . Oxycodone HCl 10 MG TABS Take 10 mg by mouth every 4 (four) hours as needed (for pain).      No current  facility-administered medications for this visit.      Physical Exam  Blood pressure (!) 145/72, pulse 75, temperature 98.3 F (36.8 C), height 5' (1.524 m), weight 246 lb (111.6 kg).  Constitutional: overall normal hygiene, normal nutrition, well developed, normal grooming, normal body habitus. Assistive device:walker  Musculoskeletal: gait and station Limp right, muscle tone and strength are normal, no tremors or atrophy is present.  .  Neurological: coordination overall normal.  Deep  tendon reflex/nerve stretch intact.  Sensation normal.  Cranial nerves II-XII intact.   Skin:   Normal overall no scars, lesions, ulcers or rashes. No psoriasis.  Psychiatric: Alert and oriented x 3.  Recent memory intact, remote memory unclear.  Normal mood and affect. Well groomed.  Good eye contact.  Cardiovascular: overall no swelling, no varicosities, no edema bilaterally, normal temperatures of the legs and arms, no clubbing, cyanosis and good capillary refill.  Lymphatic: palpation is normal.  Left shoulder has abduction 110., forward 125, internal 25,external 20, extension 10, adduction full.  Grips normal.  Right hip has full motion but very tender.  X-rays of the right hip were done, reported separately.  All other systems reviewed and are negative   The patient has been educated about the nature of the problem(s) and counseled on treatment options.  The patient appeared to understand what I have discussed and is in agreement with it.  Encounter Diagnoses  Name Primary?  . Pain in right hip Yes  . Pain in joint of left shoulder     PLAN Call if any problems.  Precautions discussed.  Continue current medications.   Return to clinic 6 weeks   Continue PT for at least another three weeks.  Electronically Signed Sanjuana Kava, MD 7/30/202011:15 AM

## 2019-07-01 ENCOUNTER — Other Ambulatory Visit: Payer: Self-pay | Admitting: Cardiology

## 2019-07-01 MED ORDER — SPIRONOLACTONE 25 MG PO TABS: ORAL_TABLET | ORAL | 2 refills | Status: DC

## 2019-07-01 NOTE — Telephone Encounter (Signed)
° °  1. Which medications need to be refilled? (please list name of each medication and dose if known)  Aldactone 25   2. Which pharmacy/location (including street and city if local pharmacy) is medication to be sent to? [facility], [place]  3. Do they need a 30 day or 90 day supply?

## 2019-07-01 NOTE — Telephone Encounter (Signed)
Medication sent to pharmacy  

## 2019-07-26 ENCOUNTER — Other Ambulatory Visit: Payer: Self-pay

## 2019-07-26 ENCOUNTER — Ambulatory Visit (INDEPENDENT_AMBULATORY_CARE_PROVIDER_SITE_OTHER): Payer: Medicare Other | Admitting: *Deleted

## 2019-07-26 DIAGNOSIS — I4891 Unspecified atrial fibrillation: Secondary | ICD-10-CM

## 2019-07-26 DIAGNOSIS — Z5181 Encounter for therapeutic drug level monitoring: Secondary | ICD-10-CM | POA: Diagnosis not present

## 2019-07-26 LAB — POCT INR: INR: 2.7 (ref 2.0–3.0)

## 2019-07-26 NOTE — Patient Instructions (Signed)
Continue coumadin 1 tablet daily except 1/2 tablet on Tuesdays and Saturdays Recheck INR in 4 weeks

## 2019-08-08 ENCOUNTER — Emergency Department (HOSPITAL_COMMUNITY)
Admission: EM | Admit: 2019-08-08 | Discharge: 2019-08-08 | Disposition: A | Discharge status: Home / Self Care | Payer: Medicare Other | Attending: Emergency Medicine | Admitting: Emergency Medicine

## 2019-08-08 ENCOUNTER — Other Ambulatory Visit: Payer: Self-pay

## 2019-08-08 ENCOUNTER — Encounter (HOSPITAL_COMMUNITY): Payer: Self-pay

## 2019-08-08 ENCOUNTER — Emergency Department (HOSPITAL_COMMUNITY): Payer: Medicare Other

## 2019-08-08 DIAGNOSIS — Y9301 Activity, walking, marching and hiking: Secondary | ICD-10-CM | POA: Diagnosis not present

## 2019-08-08 DIAGNOSIS — S838X2A Sprain of other specified parts of left knee, initial encounter: Secondary | ICD-10-CM | POA: Insufficient documentation

## 2019-08-08 DIAGNOSIS — Z955 Presence of coronary angioplasty implant and graft: Secondary | ICD-10-CM | POA: Insufficient documentation

## 2019-08-08 DIAGNOSIS — Y929 Unspecified place or not applicable: Secondary | ICD-10-CM | POA: Insufficient documentation

## 2019-08-08 DIAGNOSIS — I110 Hypertensive heart disease with heart failure: Secondary | ICD-10-CM | POA: Diagnosis not present

## 2019-08-08 DIAGNOSIS — Z7902 Long term (current) use of antithrombotics/antiplatelets: Secondary | ICD-10-CM | POA: Insufficient documentation

## 2019-08-08 DIAGNOSIS — Y999 Unspecified external cause status: Secondary | ICD-10-CM | POA: Diagnosis not present

## 2019-08-08 DIAGNOSIS — I259 Chronic ischemic heart disease, unspecified: Secondary | ICD-10-CM | POA: Diagnosis not present

## 2019-08-08 DIAGNOSIS — W010XXA Fall on same level from slipping, tripping and stumbling without subsequent striking against object, initial encounter: Secondary | ICD-10-CM | POA: Insufficient documentation

## 2019-08-08 DIAGNOSIS — J449 Chronic obstructive pulmonary disease, unspecified: Secondary | ICD-10-CM | POA: Diagnosis not present

## 2019-08-08 DIAGNOSIS — Z853 Personal history of malignant neoplasm of breast: Secondary | ICD-10-CM | POA: Insufficient documentation

## 2019-08-08 DIAGNOSIS — S8991XA Unspecified injury of right lower leg, initial encounter: Secondary | ICD-10-CM | POA: Diagnosis present

## 2019-08-08 DIAGNOSIS — S8391XA Sprain of unspecified site of right knee, initial encounter: Secondary | ICD-10-CM

## 2019-08-08 DIAGNOSIS — Z7901 Long term (current) use of anticoagulants: Secondary | ICD-10-CM | POA: Diagnosis not present

## 2019-08-08 DIAGNOSIS — S838X1A Sprain of other specified parts of right knee, initial encounter: Secondary | ICD-10-CM | POA: Insufficient documentation

## 2019-08-08 DIAGNOSIS — I5043 Acute on chronic combined systolic (congestive) and diastolic (congestive) heart failure: Secondary | ICD-10-CM | POA: Diagnosis not present

## 2019-08-08 DIAGNOSIS — Z87891 Personal history of nicotine dependence: Secondary | ICD-10-CM | POA: Insufficient documentation

## 2019-08-08 DIAGNOSIS — Z79899 Other long term (current) drug therapy: Secondary | ICD-10-CM | POA: Diagnosis not present

## 2019-08-08 NOTE — Discharge Instructions (Signed)
Apply ice packs on and off to your knees.  Wear the knee brace and Ace wrap as needed for support, but do not wear continuously or at bedtime.  Tylenol every 4 hours if needed for pain.  Follow-up with your primary doctor later this week for recheck

## 2019-08-08 NOTE — ED Provider Notes (Signed)
[facility] EMERGENCY DEPARTMENT Provider Note   CSN: [id number] Arrival date & time: 08/08/19  1048     History   Chief Complaint Chief Complaint  Patient presents with  . Knee Pain    HPI [name] is a 73 y.o. female.     HPI   [name] is a 73 y.o. female with past medical history of chronic pain, CHF, hypertension, hyperlipidemia, COPD and metastatic breast cancer, presents to the Emergency Department complaining of bilateral knee pain after a mechanical fall that occurred 4 days ago.  She states that she slipped and fell landing onto both knees.  She reports worsening pain since her fall.  She has been ambulatory with a walker, but describes pain associated with weightbearing and with bending her knees.  She states the pain is mostly at her kneecaps radiates into her "shins."  She denies head injury, LOC, neck or back pain, numbness or weakness of her lower extremities or other injuries.   Past Medical History:  Diagnosis Date  . CAD in native artery    a.DES to ramus 2005 with late stent thrombosis 2006 tx with PTCA. 12/18 PCI/DES x1 to mRCA, EF 50-55%  . CHF (congestive heart failure) ([place])   . Chronic pain   . COPD (chronic obstructive pulmonary disease) ([place])   . Hyperlipidemia   . Hypertension   . LBBB (left bundle branch block)   . Left bundle branch block   . Lymphedema   . Metastatic breast cancer ([place])    a. to bone.  . MI (myocardial infarction) ([place])   . Mild aortic stenosis 10/2017  . Morbid obesity ([place])   . PSVT (paroxysmal supraventricular tachycardia) ([place])    a. per [facility] notes, seen on event monitor in 2014.  . Pulmonary nodules     Patient Active Problem List   Diagnosis Date Noted  . Adjustment disorder with depressed mood 06/30/2019  . Encounter for therapeutic drug monitoring 12/28/2018  . Atrial fibrillation with rapid ventricular response ([place]) 11/14/2018  . Fall   . Obesity, Class III, BMI 40-49.9 (morbid obesity)  ([place])   . Chronic respiratory failure with hypoxia ([place])   . Elevated troponin 10/07/2018  . PSVT (paroxysmal supraventricular tachycardia) ([place]) 10/07/2018  . Hyperlipidemia 10/07/2018  . COPD (chronic obstructive pulmonary disease) ([place]) 10/07/2018  . CAD in native artery 10/07/2018  . Chronic pain 10/07/2018  . Acute back pain 10/07/2018  . Acute hypoxemic respiratory failure ([place]) 09/19/2018  . Acute on chronic combined systolic and diastolic CHF (congestive heart failure) ([place]) 09/19/2018  . COPD with acute exacerbation ([place]) 09/19/2018  . Dyspnea 11/30/2017  . Non-ST elevation (NSTEMI) myocardial infarction ([place])   . Hypertension 11/23/2017  . Left bundle branch block 11/23/2017  . Acute on chronic diastolic CHF (congestive heart failure) ([place]) 11/23/2017  . CAD (coronary artery disease) 11/23/2017  . Breast cancer ([place]) 11/23/2017  . Acute respiratory failure with hypoxia ([place]) 11/23/2017  . Atypical nevus 07/29/2016  . Age-related nuclear cataract, bilateral 02/12/2016  . Anxiety and depression 01/24/2016  . Arthropathy, lower leg 09/14/2013  . Tibialis tendinitis 02/22/2013  . Plantar fasciitis 01/17/2013    Past Surgical History:  Procedure Laterality Date  . CORONARY STENT INTERVENTION N/A 11/26/2017   Procedure: CORONARY STENT INTERVENTION;  Surgeon: Martinique, Peter M, MD;  Location: [facility];  Service: Cardiovascular;  Laterality: N/A;  . CORONARY STENT PLACEMENT    . LEFT HEART CATH AND CORONARY ANGIOGRAPHY N/A 11/26/2017  Procedure: LEFT HEART CATH AND CORONARY ANGIOGRAPHY;  Surgeon: Martinique, Peter M, MD;  Location: [place] [facility];  Service: Cardiovascular;  Laterality: N/A;     OB History   No obstetric history on file.      Home Medications    Prior to Admission medications   Medication Sig Start Date End Date Taking? Authorizing Provider  acetaminophen (TYLENOL) 500 MG tablet Take 500 mg by mouth every 6 (six) hours as needed.   Yes  [provider]  atorvastatin (LIPITOR) 80 MG tablet Take 1 tablet (80 mg total) by mouth every morning. 03/25/19  Yes BranchAlphonse Guild, MD  b complex vitamins tablet Take 1 tablet by mouth every morning.    Yes [provider]  budesonide (PULMICORT) 0.25 MG/2ML nebulizer solution Take 2 mLs (0.25 mg total) by nebulization 2 (two) times daily. 09/21/18 09/21/19 Yes Erline Hau, MD  Calcium Citrate-Vitamin D (CITRACAL MAXIMUM) 315-250 MG-UNIT TABS Take 2 tablets by mouth 2 (two) times daily.   Yes [provider]  clopidogrel (PLAVIX) 75 MG tablet Take 1 tablet (75 mg total) by mouth daily. 03/25/19  Yes Branch, Alphonse Guild, MD  diazepam (VALIUM) 5 MG tablet Take 1 tablet by mouth 2 (two) times daily. 07/18/19 08/17/19 Yes [provider]  DULoxetine (CYMBALTA) 30 MG capsule Take 60 mg by mouth daily.  06/28/19  Yes [provider]  furosemide (LASIX) 80 MG tablet Take 1 tablet (80 mg total) by mouth daily. 03/25/19  Yes BranchAlphonse Guild, MD  gabapentin (NEURONTIN) 300 MG capsule Take 300 mg by mouth every morning.   Yes [provider]  gabapentin (NEURONTIN) 400 MG capsule Take 800 mg by mouth at bedtime.   Yes [provider]  ipratropium (ATROVENT) 0.02 % nebulizer solution Take 2.5 mLs (0.5 mg total) by nebulization every 12 (twelve) hours. 11/14/18  Yes Barton Dubois, MD  letrozole [facility]) 2.5 MG tablet Take 2.5 mg by mouth at bedtime.    Yes [provider]  lidocaine-prilocaine (EMLA) cream Apply topically. 06/28/19  Yes [provider]  losartan (COZAAR) 100 MG tablet Take 1 tablet (100 mg total) by mouth daily. 03/25/19  Yes Branch, Alphonse Guild, MD  metoprolol succinate (TOPROL-XL) 100 MG 24 hr tablet TAKE 1 TABLET BY MOUTH DAILY. TAKE WITH OR IMMEDIATELY FOLLOWING A MEAL 03/25/19  Yes Branch, Alphonse Guild, MD  Multiple Vitamins-Minerals (EQ VISION FORMULA 50+ PO) Take 1 tablet by mouth every morning.    Yes [provider]  Multiple Vitamins-Minerals (HAIR/SKIN/NAILS) CAPS Take 1 capsule by mouth every morning.   Yes [provider]  nitroGLYCERIN (NITROSTAT) 0.4 MG SL tablet Place 1 tablet (0.4 mg total) under the tongue every 5 (five) minutes x 3 doses as needed for chest pain (if no relief after 3rd dose, proceed to the ED or call 911). 03/09/19  Yes Branch, Alphonse Guild, MD  oxyCODONE (OXY IR/ROXICODONE) 5 MG immediate release tablet Take 5 mg by mouth every 6 (six) hours as needed.  06/15/19  Yes [provider]  Oxycodone HCl 10 MG TABS Take 10 mg by mouth every 4 (four) hours as needed (for pain).    Yes [provider]  Potassium 99 MG TABS Take 2 tablets by mouth every morning.    Yes [provider]  pregabalin (LYRICA) 100 MG capsule Take 1 capsule by mouth 3 (three) times daily. 08/01/19  Yes [provider]  spironolactone (ALDACTONE) 25 MG tablet TAKE 1/2 TABLET(12.5  MG) BY MOUTH DAILY 07/01/19  Yes Branch, Alphonse Guild, MD  Ubiquinol (QUNOL COQ10/UBIQUINOL/MEGA) 100 MG CAPS Take 1 capsule by mouth every morning. QUNOL-Ultra COQ10   Yes [provider]  warfarin (COUMADIN) 1 MG tablet Take 1 tablet daily except 1 1/2 tablets on Wednesdays or as directed by Coumadin clinic 04/06/19  Yes Branch, Alphonse Guild, MD    Family History Family History  Problem Relation Age of Onset  . CAD Father 55  . Heart attack Father   . COPD Sister   . CAD Paternal Grandmother   . Sudden Cardiac Death Neg Hx     Social History Social History   Tobacco Use  . Smoking status: Former Smoker    Packs/day: 2.00    Years: 18.00    Pack years: 36.00    Types: Cigarettes    Quit date: 12/01/1980    Years since quitting: 38.7  . Smokeless tobacco: Never Used  Substance Use Topics  . Alcohol use: No    Frequency: Never  . Drug use: No     Allergies   Brilinta [ticagrelor] and Albuterol   Review of Systems Review of Systems   Constitutional: Negative for chills and fever.  Respiratory: Negative for shortness of breath.   Cardiovascular: Negative for chest pain.  Gastrointestinal: Negative for abdominal pain, nausea and vomiting.  Genitourinary: Negative for difficulty urinating and dysuria.  Musculoskeletal: Positive for arthralgias (Bilateral knee pain). Negative for joint swelling.  Skin: Negative for color change and wound.  Neurological: Negative for dizziness, syncope, weakness, numbness and headaches.     Physical Exam Updated Vital Signs BP (!) 144/78 (BP Location: Left Wrist)   Pulse 79   Temp 98.4 F (36.9 C) (Oral)   Resp 18   Ht 5' (1.524 m)   Wt 117.9 kg   SpO2 97%   BMI 50.78 kg/m   Physical Exam Vitals signs and nursing note reviewed.  Constitutional:      Appearance: Normal appearance. She is not toxic-appearing.  HENT:     Head: Atraumatic.  Eyes:     Extraocular Movements: Extraocular movements intact.     Pupils: Pupils are equal, round, and reactive to light.  Neck:     Musculoskeletal: Normal range of motion. No muscular tenderness.  Cardiovascular:     Rate and Rhythm: Normal rate and regular rhythm.     Pulses: Normal pulses.  Pulmonary:     Effort: Pulmonary effort is normal.     Breath sounds: No wheezing.  Chest:     Chest wall: No tenderness.  Abdominal:     Palpations: Abdomen is soft.     Tenderness: There is no abdominal tenderness.  Musculoskeletal:        General: Tenderness and signs of injury present.     Right knee: She exhibits normal range of motion, no swelling, no deformity and normal patellar mobility. Tenderness found. Patellar tendon tenderness noted.     Comments: ttp of the bilateral anterior knees.  ttp of the patellar tendon, tendon appears intact.  No effusion or step off deformity noted.  No significant pain on valgus or varus stresses.  Neg drawer sign bilaterally.  Compartments of LE's are soft.    Skin:    General: Skin is warm.      Capillary Refill: Capillary refill takes less than 2 seconds.     Findings: No bruising, erythema or rash.  Neurological:     General: No focal deficit present.  Mental Status: She is alert.     Sensory: No sensory deficit.     Motor: No weakness.      ED Treatments / Results  Labs (all labs ordered are listed, but only abnormal results are displayed) Labs Reviewed - No data to display  EKG None  Radiology Dg Knee Complete 4 Views Left  Result Date: 08/08/2019 CLINICAL DATA:  Left knee pain since a fall 08/04/2019. Initial encounter. EXAM: LEFT KNEE - COMPLETE 4+ VIEW COMPARISON:  None. FINDINGS: There is no acute bony or joint abnormality. There is some osteophytosis about the knee, most notable in the lateral and patellofemoral compartments. Small joint effusion. No chondrocalcinosis. IMPRESSION: Mild-to-moderate osteoarthritis that no acute finding. Mild to moderate osteoarthritis. Small joint effusion. Electronically Signed   By: Inge Rise M.D.   On: 08/08/2019 12:03   Dg Knee Complete 4 Views Right  Result Date: 08/08/2019 CLINICAL DATA:  Right knee pain since the patient suffered a fall onto her knee 08/04/2019. Initial encounter. EXAM: RIGHT KNEE - COMPLETE 4+ VIEW COMPARISON:  None. FINDINGS: There is no acute bony or joint abnormality. Advanced tricompartmental osteoarthritis is worst medially. No joint effusion. No chondrocalcinosis. IMPRESSION: No acute abnormality. Advanced tricompartmental osteoarthritis. Electronically Signed   By: Inge Rise M.D.   On: 08/08/2019 12:01    Procedures Procedures (including critical care time)  Medications Ordered in ED Medications - No data to display   Initial Impression / Assessment and Plan / ED Course  I have reviewed the triage vital signs and the nursing notes.  Pertinent labs & imaging results that were available during my care of the patient were reviewed by me and considered in my medical decision making (see  chart for details).        Patient with bilateral knee pain secondary to a fall 4 days ago.  Neurovascularly intact.  Compartments are soft.  X-rays of the bilateral knees show osteoarthritis without acute bony abnormality.  She is tender over the bilateral patellar tendons, but tendons appear intact there is no step-off deformity of the knee joint.  Patient uses walker at baseline she is able to stand and make few steps in the exam room.  Knee sleeve applied.  Patient agrees to close outpatient follow-up with PCP, elevation, ice, and return precautions given.    Final Clinical Impressions(s) / ED Diagnoses   Final diagnoses:  Knee sprain, bilateral    ED Discharge Orders    None       Kem Parkinson, PA-C 08/08/19 1648    Milton Ferguson, MD 08/12/19 1111

## 2019-08-08 NOTE — ED Triage Notes (Signed)
Pt reports she lost her balance THursday and fell onto her knees.  C/o pain in both knees since then.

## 2019-08-11 ENCOUNTER — Encounter: Payer: Self-pay | Admitting: Orthopaedic Surgery

## 2019-08-11 ENCOUNTER — Ambulatory Visit (INDEPENDENT_AMBULATORY_CARE_PROVIDER_SITE_OTHER): Payer: Medicare Other | Admitting: Orthopaedic Surgery

## 2019-08-11 ENCOUNTER — Other Ambulatory Visit: Payer: Self-pay

## 2019-08-11 VITALS — BP 126/68 | HR 88 | Wt 251.0 lb

## 2019-08-11 DIAGNOSIS — I2510 Atherosclerotic heart disease of native coronary artery without angina pectoris: Secondary | ICD-10-CM

## 2019-08-11 DIAGNOSIS — Z6841 Body Mass Index (BMI) 40.0 and over, adult: Secondary | ICD-10-CM

## 2019-08-11 DIAGNOSIS — M25512 Pain in left shoulder: Secondary | ICD-10-CM

## 2019-08-11 NOTE — Progress Notes (Signed)
CC:  My shoulder is much better  She has been to PT and had no pain in the left shoulder.    She fell and was seen in the ER for knee pain bilaterally.  She had negative X-rays. She is better now.  Encounter Diagnoses  Name Primary?  . Pain in joint of left shoulder Yes  . Body mass index 45.0-49.9, adult ([place])   . Morbid obesity ([place])    I will see as needed. Electronically Signed Sanjuana Kava, MD 9/10/202010:16 AM

## 2019-08-15 ENCOUNTER — Telehealth: Payer: Self-pay | Admitting: Cardiology

## 2019-08-15 NOTE — Telephone Encounter (Signed)
Dr Leavy Cella office should be sending a clearance for her to have an procedure

## 2019-08-15 NOTE — Telephone Encounter (Signed)
Have not received pre-op clearance request Pt notified we have received yet. She states that we should receive after thursday

## 2019-08-24 ENCOUNTER — Ambulatory Visit (INDEPENDENT_AMBULATORY_CARE_PROVIDER_SITE_OTHER): Payer: Medicare Other | Admitting: *Deleted

## 2019-08-24 ENCOUNTER — Encounter: Payer: Self-pay | Admitting: Cardiology

## 2019-08-24 ENCOUNTER — Ambulatory Visit (INDEPENDENT_AMBULATORY_CARE_PROVIDER_SITE_OTHER): Payer: Medicare Other | Admitting: Cardiology

## 2019-08-24 ENCOUNTER — Other Ambulatory Visit: Payer: Self-pay

## 2019-08-24 VITALS — BP 119/74 | HR 73 | Ht 60.0 in | Wt 259.8 lb

## 2019-08-24 DIAGNOSIS — I2510 Atherosclerotic heart disease of native coronary artery without angina pectoris: Secondary | ICD-10-CM | POA: Diagnosis not present

## 2019-08-24 DIAGNOSIS — I4891 Unspecified atrial fibrillation: Secondary | ICD-10-CM

## 2019-08-24 DIAGNOSIS — Z5181 Encounter for therapeutic drug level monitoring: Secondary | ICD-10-CM

## 2019-08-24 DIAGNOSIS — I5023 Acute on chronic systolic (congestive) heart failure: Secondary | ICD-10-CM

## 2019-08-24 LAB — POCT INR: INR: 1.7 — AB (ref 2.0–3.0)

## 2019-08-24 MED ORDER — TORSEMIDE 20 MG PO TABS: 40.0000 mg | ORAL_TABLET | Freq: Every day | ORAL | 1 refills | Status: DC

## 2019-08-24 NOTE — Patient Instructions (Signed)
Take coumadin 2 tablets tonight then resume 1 tablet daily except 1/2 tablet on Tuesdays and Saturdays OK to hold coumadin 7 days for back injection at [facility] per Dr Harl Bowie Recheck INR in 3 weeks

## 2019-08-24 NOTE — Patient Instructions (Addendum)
Your physician wants you to follow-up in: [place] will receive a reminder letter in the mail two months in advance. If you don't receive a letter, please call our office to schedule the follow-up appointment.  Your physician has recommended you make the following change in your medication:   STOP LASIX   START TORSEMIDE 40 MG (2 TABLETS) DAILY   Your physician recommends that you return for lab work in: 2 WEEKS BMP/MG  Thank you for choosing [facility]!!

## 2019-08-24 NOTE — Progress Notes (Signed)
Clinical Summary Stacey [name] is a 73 y.o.female seen today for follow up of the following medical problems.  1. Chronic systolic HF/ICM - Jan [id number] echo LVEF 20-25%, restrictive diastolic function, mild AS, PASP 40  - 10/2017 echo LVEF 50%, dropped to 30-35% Jan 2019 and has remained decreased  - chronic left leg edema thought to be venous insufficiency  From prior cardiologist: "She is not a candidate for device therapy given her metastatic cancer and numerous comorbidities and poor long-term prognosis. She has been scheduled to establish care with the palliative care team which I think is quite appropriate". - has been hesitant to change to entresto due to concern about cost   - no recent SOB or DOE. Some recent leg edema that has increased - weight 246 in July, today 259 lbs.  - compliant with meds   2. CAD - notesincidate history of DES to ramus, late stentthrombosis requiring PTCA in 2006.DES to RCA in Dec 2018 - SOB on brillinta, previously changed to plavix.Has been on plavix and coumadin.  - no recent chest pain   3. Mild aortic stenosis - Jan 2020 echo mean grad 11, AVA VTI 1.69  4. Chronic LBBB   5. . Metastatic breast cancer - followed at [facility]. Metastatic breast cancer to the bone and renal lesion, has pulmonary nodules. On radiation. - has been seen by palliative care previously     6. Afib - noted during Jan 2020 admission, new diagnosis - managed with Toprol, started on eliquis.NO evidence of brain mets by 10/2018 CT.  -isolated episode of palpitations since last visit - compliant with meds - has not been interested in NOACs due to cost  7. Preop clearance - needing epidural at [facility] - would need to be off coumadin and plavix.   Past Medical History:  Diagnosis Date  . CAD in native artery    a.DES to ramus 2005 with late stent thrombosis 2006 tx with PTCA. 12/18 PCI/DES x1 to mRCA, EF 50-55%  . CHF (congestive heart  failure) ([place])   . Chronic pain   . COPD (chronic obstructive pulmonary disease) ([place])   . Hyperlipidemia   . Hypertension   . LBBB (left bundle Davey Limas block)   . Left bundle Leya Paige block   . Lymphedema   . Metastatic breast cancer ([place])    a. to bone.  . MI (myocardial infarction) ([place])   . Mild aortic stenosis 10/2017  . Morbid obesity ([place])   . PSVT (paroxysmal supraventricular tachycardia) ([place])    a. per [facility] notes, seen on event monitor in 2014.  . Pulmonary nodules      Allergies  Allergen Reactions  . Brilinta [Ticagrelor] Other (See Comments)    Nausea and severe diarrhea- general weakness. Patient says does not want to take again  . Albuterol Other (See Comments)    Heart racing      Current Outpatient Medications  Medication Sig Dispense Refill  . acetaminophen (TYLENOL) 500 MG tablet Take 500 mg by mouth every 6 (six) hours as needed.    Marland Kitchen atorvastatin (LIPITOR) 80 MG tablet Take 1 tablet (80 mg total) by mouth every morning. 90 tablet 3  . b complex vitamins tablet Take 1 tablet by mouth every morning.     . budesonide (PULMICORT) 0.25 MG/2ML nebulizer solution Take 2 mLs (0.25 mg total) by nebulization 2 (two) times daily. 120 mL 11  . Calcium Citrate-Vitamin D (CITRACAL MAXIMUM) 315-250 MG-UNIT TABS Take 2 tablets by  mouth 2 (two) times daily.    . clopidogrel (PLAVIX) 75 MG tablet Take 1 tablet (75 mg total) by mouth daily. 90 tablet 3  . DULoxetine (CYMBALTA) 30 MG capsule Take 60 mg by mouth daily.     . furosemide (LASIX) 80 MG tablet Take 1 tablet (80 mg total) by mouth daily. 90 tablet 3  . gabapentin (NEURONTIN) 300 MG capsule Take 300 mg by mouth every morning.    . gabapentin (NEURONTIN) 400 MG capsule Take 800 mg by mouth at bedtime.    Marland Kitchen ipratropium (ATROVENT) 0.02 % nebulizer solution Take 2.5 mLs (0.5 mg total) by nebulization every 12 (twelve) hours. 75 mL 3  . letrozole (FEMARA) 2.5 MG tablet Take 2.5 mg by mouth at bedtime.     .  lidocaine-prilocaine (EMLA) cream Apply topically.    Marland Kitchen losartan (COZAAR) 100 MG tablet Take 1 tablet (100 mg total) by mouth daily. 90 tablet 3  . metoprolol succinate (TOPROL-XL) 100 MG 24 hr tablet TAKE 1 TABLET BY MOUTH DAILY. TAKE WITH OR IMMEDIATELY FOLLOWING A MEAL 90 tablet 3  . Multiple Vitamins-Minerals (EQ VISION FORMULA 50+ PO) Take 1 tablet by mouth every morning.    . Multiple Vitamins-Minerals (HAIR/SKIN/NAILS) CAPS Take 1 capsule by mouth every morning.    . nitroGLYCERIN (NITROSTAT) 0.4 MG SL tablet Place 1 tablet (0.4 mg total) under the tongue every 5 (five) minutes x 3 doses as needed for chest pain (if no relief after 3rd dose, proceed to the ED or call 911). 25 tablet 2  . oxyCODONE (OXY IR/ROXICODONE) 5 MG immediate release tablet Take 5 mg by mouth every 6 (six) hours as needed.     . Oxycodone HCl 10 MG TABS Take 10 mg by mouth every 4 (four) hours as needed (for pain).     . Potassium 99 MG TABS Take 2 tablets by mouth every morning.     . pregabalin (LYRICA) 100 MG capsule Take 1 capsule by mouth 3 (three) times daily.    Marland Kitchen spironolactone (ALDACTONE) 25 MG tablet TAKE 1/2 TABLET(12.5 MG) BY MOUTH DAILY 45 tablet 2  . Ubiquinol (QUNOL COQ10/UBIQUINOL/MEGA) 100 MG CAPS Take 1 capsule by mouth every morning. QUNOL-Ultra COQ10    . warfarin (COUMADIN) 1 MG tablet Take 1 tablet daily except 1 1/2 tablets on Wednesdays or as directed by Coumadin clinic 40 tablet 3   No current facility-administered medications for this visit.      Past Surgical History:  Procedure Laterality Date  . CORONARY STENT INTERVENTION N/A 11/26/2017   Procedure: CORONARY STENT INTERVENTION;  Surgeon: Martinique, Peter M, MD;  Location: [facility];  Service: Cardiovascular;  Laterality: N/A;  . CORONARY STENT PLACEMENT    . LEFT HEART CATH AND CORONARY ANGIOGRAPHY N/A 11/26/2017   Procedure: LEFT HEART CATH AND CORONARY ANGIOGRAPHY;  Surgeon: Martinique, Peter M, MD;  Location: [facility];   Service: Cardiovascular;  Laterality: N/A;     Allergies  Allergen Reactions  . Brilinta [Ticagrelor] Other (See Comments)    Nausea and severe diarrhea- general weakness. Patient says does not want to take again  . Albuterol Other (See Comments)    Heart racing       Family History  Problem Relation Age of Onset  . CAD Father 77  . Heart attack Father   . COPD Sister   . CAD Paternal Grandmother   . Sudden Cardiac Death Neg Hx      Social History Ms.  Alldredge reports that she quit smoking about 38 years ago. Her smoking use included cigarettes. She has a 36.00 pack-year smoking history. She has never used smokeless tobacco. Ms. Barrile reports no history of alcohol use.   Review of Systems CONSTITUTIONAL: No weight loss, fever, chills, weakness or fatigue.  HEENT: Eyes: No visual loss, blurred vision, double vision or yellow sclerae.No hearing loss, sneezing, congestion, runny nose or sore throat.  SKIN: No rash or itching.  CARDIOVASCULAR: per hpi RESPIRATORY: No shortness of breath, cough or sputum.  GASTROINTESTINAL: No anorexia, nausea, vomiting or diarrhea. No abdominal pain or blood.  GENITOURINARY: No burning on urination, no polyuria NEUROLOGICAL: No headache, dizziness, syncope, paralysis, ataxia, numbness or tingling in the extremities. No change in bowel or bladder control.  MUSCULOSKELETAL: No muscle, back pain, joint pain or stiffness.  LYMPHATICS: No enlarged nodes. No history of splenectomy.  PSYCHIATRIC: No history of depression or anxiety.  ENDOCRINOLOGIC: No reports of sweating, cold or heat intolerance. No polyuria or polydipsia.  Marland Kitchen   Physical Examination Today's Vitals   08/24/19 0941  BP: 119/74  Pulse: 73  SpO2: 93%  Weight: 259 lb 12.8 oz (117.8 kg)  Height: 5' (1.524 m)   Body mass index is 50.74 kg/m.  Gen: resting comfortably, no acute distress HEENT: no scleral icterus, pupils equal round and reactive, no palptable cervical  adenopathy,  CV: RRR, no m/r/g, no jvd Resp: faint bilateral crackles.  GI: abdomen is soft, non-tender, non-distended, normal bowel sounds, no hepatosplenomegaly MSK: extremities are warm, 2+ bilateral LE edema L>R  Skin: warm, no rash Neuro:  no focal deficits Psych: appropriate affect   Diagnostic Studies  10/2017 cath  Ost Ramus to Ramus lesion is 100% stenosed.  Mid RCA lesion is 90% stenosed.  A drug-eluting stent was successfully placed using a STENT SYNERGY DES 2.5X16.  Post intervention, there is a 0% residual stenosis.  LV end diastolic pressure is normal.  1. 2 vessel obstructive CAD - 100% proximal ramus intermediate at site of prior stent. I think this is a CTO and she has good collaterals.  - 90% ulcerative mid RCA 2. Normal LVEDP 3. Successful PCI of the mid RCA with DES   10/2017 echo Study Conclusions  - Left ventricle: The cavity size was normal. There was mild concentric hypertrophy. Systolic function was normal. The estimated ejection fraction was in the range of 50% to 55%. There was an increased relative contribution of atrial contraction to ventricular filling. Doppler parameters are consistent with abnormal left ventricular relaxation (grade 1 diastolic dysfunction). - Aortic valve: There was mild stenosis. Valve area (VTI): 1.66 cm^2. Valve area (Vmax): 1.71 cm^2. Valve area (Vmean): 1.62 cm^2. - Mitral valve: Calcified annulus. Mildly thickened leaflets . - Pulmonary arteries: PA peak pressure: 32 mm Hg (S).  Jan 2019 echo Study Conclusions  - Left ventricle: The cavity size was normal. There was mild concentric hypertrophy. Systolic function was moderately to severely reduced. The estimated ejection fraction was in the range of 30% to 35%. Diffuse hypokinesis. Doppler parameters are consistent with abnormal left ventricular relaxation (grade 1 diastolic dysfunction). - Aortic valve: Probably  trileaflet; moderately thickened, severely calcified leaflets. There was mild stenosis. Peak gradient (S): 13 mm Hg. Valve area (Vmax): 1.26 cm^2. - Mitral valve: Calcified annulus. Mildly thickened leaflets . There was mild regurgitation. - Right ventricle: The cavity size was normal. Wall thickness was normal. Systolic function was normal. - Right atrium: The atrium was normal in size. - Pulmonary arteries:  Systolic pressure could not be accurately estimated. - Inferior vena cava: The vessel was normal in size. - Pericardium, extracardiac: There was no pericardial effusion.  Impressions:  - Since the last study on 11/24/17 LVEF has decreased from 50% to 30-35% with diffuse hypokinesis.  Jan 2020 echo Study Conclusions  - Left ventricle: The cavity size was normal. Wall thickness was normal. Systolic function was severely reduced. The estimated ejection fraction was in the range of 20% to 25%. Diffuse hypokinesis. Doppler parameters are consistent with restrictive physiology, indicative of decreased left ventricular diastolic compliance and/or increased left atrial pressure. Doppler parameters are consistent with high ventricular filling pressure. - Aortic valve: There was mild stenosis. Valve area (VTI): 1.69 cm^2. Valve area (Vmax): 1.73 cm^2. Valve area (Vmean): 1.74 cm^2. - Mitral valve: Calcified annulus. There was mild regurgitation. - Left atrium: The atrium was mildly dilated. - Atrial septum: No defect or patent foramen ovale was identified. - Tricuspid valve: There was mild regurgitation. - Pulmonary arteries: PA peak pressure: 40 mm Hg (S).   Assessment and Plan  1. Acute on chronic systolic HF - appears volume overloaded, D/c lasix, start torsemide 40mg  daily. Check BMET/Mg in 2 weeks - continue other meds   2. CAD - on coumadin and plavix due to afib and history of late stent thrombosis - no recent symptoms, continue  current meds  3. PAF - DOACs too expensive,she was changed to coumadin - overall doing well,continue current meds   4. Preoperative evaluation - being considered for back procedure including epidural - ok to be off coumadin and plavix as needed for procedure. Typically held 5  days prior and restarted day after or per surgical preference.    F/u 6 weeks   Arnoldo Lenis, M.D

## 2019-08-29 ENCOUNTER — Telehealth: Payer: Self-pay | Admitting: Cardiology

## 2019-08-29 NOTE — Telephone Encounter (Signed)
Said she was returning a call but did not who it was that called

## 2019-08-29 NOTE — Telephone Encounter (Signed)
Message left on vm that not sure who called. No open encounters found.

## 2019-09-01 DIAGNOSIS — F3181 Bipolar II disorder: Secondary | ICD-10-CM | POA: Insufficient documentation

## 2019-09-14 ENCOUNTER — Ambulatory Visit (INDEPENDENT_AMBULATORY_CARE_PROVIDER_SITE_OTHER): Payer: Medicare Other | Admitting: *Deleted

## 2019-09-14 DIAGNOSIS — I4891 Unspecified atrial fibrillation: Secondary | ICD-10-CM

## 2019-09-14 DIAGNOSIS — Z5181 Encounter for therapeutic drug level monitoring: Secondary | ICD-10-CM | POA: Diagnosis not present

## 2019-09-14 LAB — POCT INR: INR: 2.3 (ref 2.0–3.0)

## 2019-09-14 NOTE — Patient Instructions (Signed)
Continue warfarin 1 tablet daily except 1/2 tablet on Tuesdays and Saturdays Pending back injection at [facility] 10/03/19.  Will hold coumadin 4 days before procedure.  INR needs to be 1.4 or less before procedure.  [facility] nurse will check that am and call results.  Pt will resume above warfarin dose night of procedure and recheck INR on 10/10/19.  Orders given to [facility] RN with Alvis Lemmings.

## 2019-09-26 ENCOUNTER — Telehealth: Payer: Self-pay | Admitting: Cardiology

## 2019-09-26 MED ORDER — METOPROLOL SUCCINATE ER 100 MG PO TB24: ORAL_TABLET | ORAL | 3 refills | Status: DC

## 2019-09-26 MED ORDER — WARFARIN SODIUM 1 MG PO TABS: ORAL_TABLET | ORAL | 3 refills | Status: DC

## 2019-09-26 NOTE — Telephone Encounter (Signed)
Patient called stating that she is having surgery 10/03/2019 at [facility].  States that we are suppose to be receiving request for patient to come of Warfarin & Plavix.   [phone] (patient cell)

## 2019-09-26 NOTE — Telephone Encounter (Signed)
°*  STAT* If patient is at the pharmacy, call can be transferred to refill team.   1. Which medications need to be refilled?metoprolol succinate (TOPROL-XL) 100 MG 24 hr tablet warfarin (COUMADIN) 1 MG tablet    2. Which pharmacy/location (including street and city if local pharmacy) is medication to be sent to? [facility] - [place]   3. Do they need a 30 day or 90 day supply? [place]

## 2019-09-26 NOTE — Telephone Encounter (Signed)
Toprol Xl refill sent to [facility] / [place].  Fwd to anticoagulation nurse for Warfarin refill.

## 2019-10-02 DIAGNOSIS — M545 Low back pain, unspecified: Secondary | ICD-10-CM | POA: Insufficient documentation

## 2019-10-04 ENCOUNTER — Ambulatory Visit: Payer: Medicare Other | Admitting: Cardiology

## 2019-11-10 ENCOUNTER — Other Ambulatory Visit: Payer: Self-pay

## 2019-11-10 ENCOUNTER — Ambulatory Visit (INDEPENDENT_AMBULATORY_CARE_PROVIDER_SITE_OTHER): Payer: Medicare Other | Admitting: *Deleted

## 2019-11-10 DIAGNOSIS — Z5181 Encounter for therapeutic drug level monitoring: Secondary | ICD-10-CM

## 2019-11-10 DIAGNOSIS — I4891 Unspecified atrial fibrillation: Secondary | ICD-10-CM

## 2019-11-10 LAB — POCT INR: INR: 1.6 — AB (ref 2.0–3.0)

## 2019-11-10 NOTE — Patient Instructions (Signed)
Take warfarin 2 tablets today and tomorrow then resume 1 tablet daily except 1/2 tablet on Tuesdays and Saturdays Recheck in 2 weeks

## 2019-11-15 ENCOUNTER — Observation Stay (HOSPITAL_COMMUNITY)
Admission: EM | Admit: 2019-11-15 | Discharge: 2019-11-16 | Disposition: A | Discharge status: Home / Self Care | Payer: Medicare Other | Attending: Internal Medicine | Admitting: Internal Medicine

## 2019-11-15 ENCOUNTER — Emergency Department (HOSPITAL_COMMUNITY): Payer: Medicare Other

## 2019-11-15 ENCOUNTER — Encounter (HOSPITAL_COMMUNITY): Payer: Self-pay | Admitting: Emergency Medicine

## 2019-11-15 ENCOUNTER — Other Ambulatory Visit: Payer: Self-pay

## 2019-11-15 DIAGNOSIS — Z8249 Family history of ischemic heart disease and other diseases of the circulatory system: Secondary | ICD-10-CM | POA: Diagnosis not present

## 2019-11-15 DIAGNOSIS — I471 Supraventricular tachycardia: Secondary | ICD-10-CM | POA: Insufficient documentation

## 2019-11-15 DIAGNOSIS — I2510 Atherosclerotic heart disease of native coronary artery without angina pectoris: Secondary | ICD-10-CM | POA: Insufficient documentation

## 2019-11-15 DIAGNOSIS — R531 Weakness: Secondary | ICD-10-CM | POA: Diagnosis not present

## 2019-11-15 DIAGNOSIS — R300 Dysuria: Secondary | ICD-10-CM | POA: Diagnosis not present

## 2019-11-15 DIAGNOSIS — I509 Heart failure, unspecified: Secondary | ICD-10-CM | POA: Insufficient documentation

## 2019-11-15 DIAGNOSIS — E7800 Pure hypercholesterolemia, unspecified: Secondary | ICD-10-CM | POA: Diagnosis not present

## 2019-11-15 DIAGNOSIS — F419 Anxiety disorder, unspecified: Secondary | ICD-10-CM | POA: Diagnosis not present

## 2019-11-15 DIAGNOSIS — R002 Palpitations: Secondary | ICD-10-CM | POA: Diagnosis not present

## 2019-11-15 DIAGNOSIS — G8929 Other chronic pain: Secondary | ICD-10-CM | POA: Insufficient documentation

## 2019-11-15 DIAGNOSIS — Z87898 Personal history of other specified conditions: Secondary | ICD-10-CM

## 2019-11-15 DIAGNOSIS — C50919 Malignant neoplasm of unspecified site of unspecified female breast: Secondary | ICD-10-CM | POA: Insufficient documentation

## 2019-11-15 DIAGNOSIS — I252 Old myocardial infarction: Secondary | ICD-10-CM | POA: Diagnosis not present

## 2019-11-15 DIAGNOSIS — Z955 Presence of coronary angioplasty implant and graft: Secondary | ICD-10-CM | POA: Insufficient documentation

## 2019-11-15 DIAGNOSIS — Z6841 Body Mass Index (BMI) 40.0 and over, adult: Secondary | ICD-10-CM | POA: Insufficient documentation

## 2019-11-15 DIAGNOSIS — Z7951 Long term (current) use of inhaled steroids: Secondary | ICD-10-CM | POA: Insufficient documentation

## 2019-11-15 DIAGNOSIS — C7951 Secondary malignant neoplasm of bone: Secondary | ICD-10-CM | POA: Diagnosis not present

## 2019-11-15 DIAGNOSIS — E66813 Obesity, class 3: Secondary | ICD-10-CM | POA: Diagnosis present

## 2019-11-15 DIAGNOSIS — I110 Hypertensive heart disease with heart failure: Secondary | ICD-10-CM | POA: Diagnosis not present

## 2019-11-15 DIAGNOSIS — J449 Chronic obstructive pulmonary disease, unspecified: Secondary | ICD-10-CM | POA: Diagnosis not present

## 2019-11-15 DIAGNOSIS — Z87891 Personal history of nicotine dependence: Secondary | ICD-10-CM | POA: Insufficient documentation

## 2019-11-15 DIAGNOSIS — I5043 Acute on chronic combined systolic (congestive) and diastolic (congestive) heart failure: Secondary | ICD-10-CM | POA: Diagnosis not present

## 2019-11-15 DIAGNOSIS — Z7902 Long term (current) use of antithrombotics/antiplatelets: Secondary | ICD-10-CM | POA: Insufficient documentation

## 2019-11-15 DIAGNOSIS — Z20828 Contact with and (suspected) exposure to other viral communicable diseases: Secondary | ICD-10-CM | POA: Diagnosis not present

## 2019-11-15 DIAGNOSIS — I447 Left bundle-branch block, unspecified: Secondary | ICD-10-CM | POA: Diagnosis not present

## 2019-11-15 DIAGNOSIS — Z7901 Long term (current) use of anticoagulants: Secondary | ICD-10-CM | POA: Insufficient documentation

## 2019-11-15 DIAGNOSIS — Z79899 Other long term (current) drug therapy: Secondary | ICD-10-CM | POA: Insufficient documentation

## 2019-11-15 DIAGNOSIS — E785 Hyperlipidemia, unspecified: Secondary | ICD-10-CM | POA: Diagnosis present

## 2019-11-15 DIAGNOSIS — F329 Major depressive disorder, single episode, unspecified: Secondary | ICD-10-CM | POA: Diagnosis not present

## 2019-11-15 DIAGNOSIS — I10 Essential (primary) hypertension: Secondary | ICD-10-CM | POA: Diagnosis present

## 2019-11-15 DIAGNOSIS — E782 Mixed hyperlipidemia: Secondary | ICD-10-CM | POA: Diagnosis present

## 2019-11-15 DIAGNOSIS — I4891 Unspecified atrial fibrillation: Secondary | ICD-10-CM | POA: Insufficient documentation

## 2019-11-15 HISTORY — DX: Paroxysmal atrial fibrillation: I48.0

## 2019-11-15 LAB — CBC WITH DIFFERENTIAL/PLATELET
Abs Immature Granulocytes: 0.02 10*3/uL (ref 0.00–0.07)
Basophils Absolute: 0 10*3/uL (ref 0.0–0.1)
Basophils Relative: 1 %
Eosinophils Absolute: 0.2 10*3/uL (ref 0.0–0.5)
Eosinophils Relative: 3 %
HCT: 38.3 % (ref 36.0–46.0)
Hemoglobin: 13 g/dL (ref 12.0–15.0)
Immature Granulocytes: 0 %
Lymphocytes Relative: 26 %
Lymphs Abs: 1.5 10*3/uL (ref 0.7–4.0)
MCH: 32.3 pg (ref 26.0–34.0)
MCHC: 33.9 g/dL (ref 30.0–36.0)
MCV: 95 fL (ref 80.0–100.0)
Monocytes Absolute: 0.6 10*3/uL (ref 0.1–1.0)
Monocytes Relative: 10 %
Neutro Abs: 3.5 10*3/uL (ref 1.7–7.7)
Neutrophils Relative %: 60 %
Platelets: 200 10*3/uL (ref 150–400)
RBC: 4.03 MIL/uL (ref 3.87–5.11)
RDW: 13.8 % (ref 11.5–15.5)
WBC: 5.7 10*3/uL (ref 4.0–10.5)
nRBC: 0 % (ref 0.0–0.2)

## 2019-11-15 LAB — BASIC METABOLIC PANEL
Anion gap: 15 (ref 5–15)
BUN: 30 mg/dL — ABNORMAL HIGH (ref 8–23)
CO2: 23 mmol/L (ref 22–32)
Calcium: 10.4 mg/dL — ABNORMAL HIGH (ref 8.9–10.3)
Chloride: 97 mmol/L — ABNORMAL LOW (ref 98–111)
Creatinine, Ser: 0.89 mg/dL (ref 0.44–1.00)
GFR calc Af Amer: >60 mL/min (ref >60)
GFR calc non Af Amer: >60 mL/min (ref >60)
Glucose, Bld: 91 mg/dL (ref 70–99)
Potassium: 3.5 mmol/L (ref 3.5–5.1)
Sodium: 135 mmol/L (ref 135–145)

## 2019-11-15 LAB — URINALYSIS, ROUTINE W REFLEX MICROSCOPIC
Bilirubin Urine: NEGATIVE
Glucose, UA: NEGATIVE mg/dL
Hgb urine dipstick: NEGATIVE
Ketones, ur: 20 mg/dL — AB
Leukocytes,Ua: NEGATIVE
Nitrite: NEGATIVE
Protein, ur: NEGATIVE mg/dL
Specific Gravity, Urine: 1.009 (ref 1.005–1.030)
pH: 5 (ref 5.0–8.0)

## 2019-11-15 LAB — CBG MONITORING, ED: Glucose-Capillary: 77 mg/dL (ref 70–99)

## 2019-11-15 LAB — TROPONIN I (HIGH SENSITIVITY): Troponin I (High Sensitivity): 13 ng/L (ref <18)

## 2019-11-15 LAB — PROTIME-INR
INR: 1.8 — ABNORMAL HIGH (ref 0.8–1.2)
Prothrombin Time: 20.6 seconds — ABNORMAL HIGH (ref 11.4–15.2)

## 2019-11-15 NOTE — ED Triage Notes (Signed)
Pt reports BP reading of 174/68 at home with heart palpitations and feeling faint starting tonight, took nitro x 1 SL tab prior to arrival

## 2019-11-15 NOTE — ED Provider Notes (Signed)
[facility] Provider Note   CSN: [id number] Arrival date & time: 11/15/19  1932     History Chief Complaint  Patient presents with   Hypertension    Stacey [name] is a 73 y.o. female with a history significant for CAD with MI x3 most recently 12/18 with DES placed, CHF, COPD, A. fib, hypertension known left bundle branch block, lymphedema and metastatic breast cancer currently under the care of oncology at [facility] presenting for evaluation of elevated blood pressure and palpitations which started this afternoon.  She describes just not feeling well this afternoon, describing feeling fatigued and weak, she took several blood pressure readings after she had several short-lived episodes of heart palpitations, with elevations in blood pressure, most recently was 174/68 prior to arrival.  Started feeling lightheaded and took 1 nitroglycerin tablet prior to arrival to treat the palpitation, as she denies having chest pain, also no shortness of breath.  She states her symptoms of MI have been somewhat variable but describes generalized fatigue, palpitation and leg weakness with a prior MI, but also in 2018 had sob with palpitations, denies cp with tht event.   She was seen this am at [facility] where she received her Faslodex injection for her metastatic breast cancer.   HPI  HPI: A 72 year old patient with a history of hypertension, hypercholesterolemia and obesity presents for evaluation of chest pain. Initial onset of pain was less than one hour ago. The patient's chest pain is not worse with exertion. The patient's chest pain is not middle- or left-sided, is not well-localized, is not described as heaviness/pressure/tightness, is not sharp and does not radiate to the arms/jaw/neck. The patient does not complain of nausea and denies diaphoresis. The patient has no history of stroke, has no history of peripheral artery disease, has not smoked in the past 90 days, denies  any history of treated diabetes and has no relevant family history of coronary artery disease (first degree relative at less than age 98).   Past Medical History:  Diagnosis Date   CAD in native artery    a.DES to ramus 2005 with late stent thrombosis 2006 tx with PTCA. 12/18 PCI/DES x1 to mRCA, EF 50-55%   CHF (congestive heart failure) (HCC)    Chronic pain    COPD (chronic obstructive pulmonary disease) (HCC)    Hyperlipidemia    Hypertension    LBBB (left bundle branch block)    Left bundle branch block    Lymphedema    Metastatic breast cancer ([place])    a. to bone.   MI (myocardial infarction) ([place])    Mild aortic stenosis 10/2017   Morbid obesity ([place])    PSVT (paroxysmal supraventricular tachycardia) ([place])    a. per [facility] notes, seen on event monitor in 2014.   Pulmonary nodules     Patient Active Problem List   Diagnosis Date Noted   Adjustment disorder with depressed mood 06/30/2019   Encounter for therapeutic drug monitoring 12/28/2018   Atrial fibrillation with rapid ventricular response ([place]) 11/14/2018   Fall    Obesity, Class III, BMI 40-49.9 (morbid obesity) ([place])    Chronic respiratory failure with hypoxia (HCC)    Elevated troponin 10/07/2018   PSVT (paroxysmal supraventricular tachycardia) ([place]) 10/07/2018   Hyperlipidemia 10/07/2018   COPD (chronic obstructive pulmonary disease) ([place]) 10/07/2018   CAD in native artery 10/07/2018   Chronic pain 10/07/2018   Acute back pain 10/07/2018   Acute hypoxemic respiratory failure ([place]) 09/19/2018  Acute on chronic combined systolic and diastolic CHF (congestive heart failure) ([place]) 09/19/2018   COPD with acute exacerbation ([place]) 09/19/2018   Dyspnea 11/30/2017   Non-ST elevation (NSTEMI) myocardial infarction [facility])    Hypertension 11/23/2017   Left bundle branch block 11/23/2017   Acute on chronic diastolic CHF (congestive heart failure) ([place]) 11/23/2017   CAD (coronary  artery disease) 11/23/2017   Breast cancer ([place]) 11/23/2017   Acute respiratory failure with hypoxia ([place]) 11/23/2017   Atypical nevus 07/29/2016   Age-related nuclear cataract, bilateral 02/12/2016   Anxiety and depression 01/24/2016   Arthropathy, lower leg 09/14/2013   Tibialis tendinitis 02/22/2013   Plantar fasciitis 01/17/2013    Past Surgical History:  Procedure Laterality Date   CORONARY STENT INTERVENTION N/A 11/26/2017   Procedure: CORONARY STENT INTERVENTION;  Surgeon: Martinique, Peter M, MD;  Location: [facility];  Service: Cardiovascular;  Laterality: N/A;   CORONARY STENT PLACEMENT     LEFT HEART CATH AND CORONARY ANGIOGRAPHY N/A 11/26/2017   Procedure: LEFT HEART CATH AND CORONARY ANGIOGRAPHY;  Surgeon: Martinique, Peter M, MD;  Location: [place] [facility];  Service: Cardiovascular;  Laterality: N/A;     OB History   No obstetric history on file.     Family History  Problem Relation Age of Onset   CAD Father 53   Heart attack Father    COPD Sister    CAD Paternal Grandmother    Sudden Cardiac Death Neg Hx     Social History   Tobacco Use   Smoking status: Former Smoker    Packs/day: 2.00    Years: 18.00    Pack years: 36.00    Types: Cigarettes    Quit date: 12/01/1980    Years since quitting: 38.9   Smokeless tobacco: Never Used  Substance Use Topics   Alcohol use: No   Drug use: No    Home Medications Prior to Admission medications   Medication Sig Start Date End Date Taking? Authorizing Provider  acetaminophen (TYLENOL) 500 MG tablet Take 500 mg by mouth every 6 (six) hours as needed.    [provider]  atorvastatin (LIPITOR) 80 MG tablet Take 1 tablet (80 mg total) by mouth every morning. 03/25/19   Arnoldo Lenis, MD  b complex vitamins tablet Take 1 tablet by mouth every morning.     [provider]  budesonide (PULMICORT) 0.25 MG/2ML nebulizer solution Take 2 mLs (0.25 mg total) by nebulization 2  (two) times daily. 09/21/18 09/21/19  Erline Hau, MD  Calcium Citrate-Vitamin D (CITRACAL MAXIMUM) [phone] MG-UNIT TABS Take 2 tablets by mouth daily.     [provider]  clopidogrel (PLAVIX) 75 MG tablet Take 1 tablet (75 mg total) by mouth daily. 03/25/19   Arnoldo Lenis, MD  diazepam (VALIUM) 5 MG tablet Take 0.25 mg by mouth every 6 (six) hours as needed for anxiety.    [provider]  DULoxetine (CYMBALTA) 60 MG capsule Take 1 capsule by mouth daily. 08/01/19   [provider]  gabapentin (NEURONTIN) 300 MG capsule Take 300 mg by mouth every morning.    [provider]  gabapentin (NEURONTIN) 400 MG capsule Take 800 mg by mouth at bedtime.    [provider]  letrozole (FEMARA) 2.5 MG tablet Take 2.5 mg by mouth at bedtime.     [provider]  lidocaine-prilocaine (EMLA) cream Apply topically. 06/28/19   [provider]  losartan (COZAAR) 100 MG tablet Take  1 tablet (100 mg total) by mouth daily. 03/25/19   Arnoldo Lenis, MD  metoprolol succinate (TOPROL-XL) 100 MG 24 hr tablet TAKE 1 TABLET BY MOUTH DAILY. TAKE WITH OR IMMEDIATELY FOLLOWING A MEAL 09/26/19   Arnoldo Lenis, MD  Multiple Vitamins-Minerals (EQ VISION FORMULA 50+ PO) Take 1 tablet by mouth every morning.    [provider]  Multiple Vitamins-Minerals (HAIR/SKIN/NAILS) CAPS Take 1 capsule by mouth every morning.    [provider]  nitroGLYCERIN (NITROSTAT) 0.4 MG SL tablet Place 1 tablet (0.4 mg total) under the tongue every 5 (five) minutes x 3 doses as needed for chest pain (if no relief after 3rd dose, proceed to the ED or call 911). 03/09/19   Branch, Alphonse Guild, MD  oxyCODONE (OXY IR/ROXICODONE) 5 MG immediate release tablet Take 5 mg by mouth every 6 (six) hours as needed.  06/15/19   [provider]  Oxycodone HCl 10 MG TABS Take 10 mg by mouth every 4 (four) hours as needed (for pain).     [provider]  Potassium 99 MG TABS Take 2 tablets by mouth every morning.     [provider]  pregabalin (LYRICA) 100 MG capsule Take 1 capsule by mouth 3 (three) times daily. 08/01/19   [provider]  spironolactone (ALDACTONE) 25 MG tablet TAKE 1/2 TABLET(12.5 MG) BY MOUTH DAILY 07/01/19   Arnoldo Lenis, MD  torsemide (DEMADEX) 20 MG tablet Take 2 tablets (40 mg total) by mouth daily. 08/24/19 11/22/19  Arnoldo Lenis, MD  Ubiquinol (QUNOL COQ10/UBIQUINOL/MEGA) 100 MG CAPS Take 1 capsule by mouth every morning. Mertie Moores    [provider]  warfarin (COUMADIN) 1 MG tablet Take 1 tablet daily except 1/2 tablets on Tuesdays and Saturdays or as directed by Coumadin clinic 09/26/19   Arnoldo Lenis, MD    Allergies    Brilinta [ticagrelor] and Albuterol  Review of Systems   Review of Systems  Constitutional: Negative for fever.  HENT: Negative for congestion.   Eyes: Negative.   Respiratory: Negative for chest tightness and shortness of breath.   Cardiovascular: Positive for palpitations. Negative for chest pain.  Gastrointestinal: Negative for abdominal pain, nausea and vomiting.  Genitourinary: Negative.   Musculoskeletal: Negative for arthralgias, joint swelling and neck pain.  Skin: Negative.  Negative for rash and wound.  Neurological: Positive for weakness. Negative for dizziness, light-headedness, numbness and headaches.  Psychiatric/Behavioral: Negative.     Physical Exam Updated Vital Signs BP 125/73    Pulse 77    Temp 97.9 F (36.6 C) (Oral)    Resp 10    Ht 5' (1.524 m)    Wt 108.3 kg    SpO2 95%    BMI 46.63 kg/m   Physical Exam Vitals and nursing note reviewed. Exam conducted with a chaperone present.  Constitutional:      Appearance: She is well-developed. She is obese.  HENT:     Head: Normocephalic and atraumatic.  Eyes:     Conjunctiva/sclera: Conjunctivae normal.  Cardiovascular:     Rate and Rhythm: Normal  rate and regular rhythm.     Heart sounds: Normal heart sounds.  Pulmonary:     Effort: Pulmonary effort is normal.     Breath sounds: Normal breath sounds. No wheezing.  Abdominal:     General: Bowel sounds are normal.     Palpations: Abdomen is soft.     Tenderness: There is no abdominal tenderness.  Genitourinary:  Vagina: No vaginal discharge or erythema.     Uterus: Not tender.      Comments: Atrophic, dry vaginal mucosa, unable to visualize cervix. Musculoskeletal:        General: Normal range of motion.     Cervical back: Normal range of motion.  Skin:    General: Skin is warm and dry.  Neurological:     Mental Status: She is alert.     ED Results / Procedures / Treatments   Labs (all labs ordered are listed, but only abnormal results are displayed) Labs Reviewed  BASIC METABOLIC PANEL - Abnormal; Notable for the following components:      Result Value   Chloride 97 (*)    BUN 30 (*)    Calcium 10.4 (*)    All other components within normal limits  PROTIME-INR - Abnormal; Notable for the following components:   Prothrombin Time 20.6 (*)    INR 1.8 (*)    All other components within normal limits  URINALYSIS, ROUTINE W REFLEX MICROSCOPIC - Abnormal; Notable for the following components:   APPearance HAZY (*)    Ketones, ur 20 (*)    All other components within normal limits  WET PREP, GENITAL  CBC WITH DIFFERENTIAL/PLATELET  CBG MONITORING, ED  TROPONIN I (HIGH SENSITIVITY)  TROPONIN I (HIGH SENSITIVITY)    EKG EKG Interpretation  Date/Time:  Tuesday November 15 2019 20:18:09 EST Ventricular Rate:  82 PR Interval:    QRS Duration: 148 QT Interval:  428 QTC Calculation: 500 R Axis:   -61 Text Interpretation: Sinus rhythm Left bundle branch block Confirmed by Veryl Speak [phone]) on 11/15/2019 8:26:11 PM   Radiology DG Chest Portable 1 View  Result Date: 11/15/2019 CLINICAL DATA:  Palpitations EXAM: PORTABLE CHEST 1 VIEW COMPARISON:  Radiograph  11/30/2018, CT chest 10/06/2018 FINDINGS: Globally increased attenuation related to body habitus. No consolidation, features of edema, pneumothorax, or effusion. Pulmonary vascularity is normally distributed. The cardiomediastinal contours are unremarkable. No acute osseous or soft tissue abnormality. IMPRESSION: Accounting for body habitus, the lungs are clear. Electronically Signed   By: Lovena Le M.D.   On: 11/15/2019 22:40    Procedures Procedures (including critical care time)  Medications Ordered in ED Medications - No data to display  ED Course  I have reviewed the triage vital signs and the nursing notes.  Pertinent labs & imaging results that were available during my care of the patient were reviewed by me and considered in my medical decision making (see chart for details).    MDM Rules/Calculators/A&P HEAR Score: 5                    Pt with h/o ACS, MI x 3, atypical presentation, never with classic chest pain, but rather generalized weakness, palpitations, similar to today's presentation.  Ekg LBBB which is an old finding.  First troponin negative.  Pt would best benefit from overnight obs/cardiac rule out.  Discussed with pt who is agreeable with plan.  Covid testing ordered.  During this conversation, discussed painful burning urination along with frequent urination of small amounts of urine not improved with abx taken last week (she does not know the name),  Also c/o vaginal dc and "bad odor".  Not sexually active.  Concern for vaginal infection.  Wet prep collected.    Call to Dr. Darrick Meigs who accepts pt for admission. Final Clinical Impression(s) / ED Diagnoses Final diagnoses:  History of palpitations  Weakness  Dysuria  Rx / DC Orders ED Discharge Orders    None       Landis Martins 11/16/19 [id number]    Veryl Speak, MD 11/16/19 [phone]

## 2019-11-16 ENCOUNTER — Encounter (HOSPITAL_COMMUNITY): Payer: Self-pay | Admitting: Family Medicine

## 2019-11-16 DIAGNOSIS — I447 Left bundle-branch block, unspecified: Secondary | ICD-10-CM | POA: Diagnosis not present

## 2019-11-16 DIAGNOSIS — R002 Palpitations: Secondary | ICD-10-CM | POA: Diagnosis not present

## 2019-11-16 DIAGNOSIS — R300 Dysuria: Secondary | ICD-10-CM | POA: Diagnosis not present

## 2019-11-16 DIAGNOSIS — I480 Paroxysmal atrial fibrillation: Secondary | ICD-10-CM

## 2019-11-16 DIAGNOSIS — E782 Mixed hyperlipidemia: Secondary | ICD-10-CM

## 2019-11-16 DIAGNOSIS — I10 Essential (primary) hypertension: Secondary | ICD-10-CM

## 2019-11-16 DIAGNOSIS — I25119 Atherosclerotic heart disease of native coronary artery with unspecified angina pectoris: Secondary | ICD-10-CM | POA: Diagnosis not present

## 2019-11-16 LAB — COMPREHENSIVE METABOLIC PANEL
ALT: 30 U/L (ref 0–44)
AST: 38 U/L (ref 15–41)
Albumin: 3.8 g/dL (ref 3.5–5.0)
Alkaline Phosphatase: 44 U/L (ref 38–126)
Anion gap: 16 — ABNORMAL HIGH (ref 5–15)
BUN: 29 mg/dL — ABNORMAL HIGH (ref 8–23)
CO2: 24 mmol/L (ref 22–32)
Calcium: 10.2 mg/dL (ref 8.9–10.3)
Chloride: 97 mmol/L — ABNORMAL LOW (ref 98–111)
Creatinine, Ser: 0.92 mg/dL (ref 0.44–1.00)
GFR calc Af Amer: >60 mL/min (ref >60)
GFR calc non Af Amer: >60 mL/min (ref >60)
Glucose, Bld: 97 mg/dL (ref 70–99)
Potassium: 3.5 mmol/L (ref 3.5–5.1)
Sodium: 137 mmol/L (ref 135–145)
Total Bilirubin: 1.5 mg/dL — ABNORMAL HIGH (ref 0.3–1.2)
Total Protein: 7 g/dL (ref 6.5–8.1)

## 2019-11-16 LAB — WET PREP, GENITAL
Clue Cells Wet Prep HPF POC: NONE SEEN
Sperm: NONE SEEN
Trich, Wet Prep: NONE SEEN
WBC, Wet Prep HPF POC: NONE SEEN
Yeast Wet Prep HPF POC: NONE SEEN

## 2019-11-16 LAB — PROTIME-INR
INR: 1.9 — ABNORMAL HIGH (ref 0.8–1.2)
Prothrombin Time: 21.8 seconds — ABNORMAL HIGH (ref 11.4–15.2)

## 2019-11-16 LAB — TROPONIN I (HIGH SENSITIVITY)
Troponin I (High Sensitivity): 11 ng/L (ref <18)
Troponin I (High Sensitivity): 12 ng/L (ref <18)

## 2019-11-16 LAB — CBC
HCT: 39.1 % (ref 36.0–46.0)
Hemoglobin: 13.1 g/dL (ref 12.0–15.0)
MCH: 32.3 pg (ref 26.0–34.0)
MCHC: 33.5 g/dL (ref 30.0–36.0)
MCV: 96.5 fL (ref 80.0–100.0)
Platelets: 183 10*3/uL (ref 150–400)
RBC: 4.05 MIL/uL (ref 3.87–5.11)
RDW: 13.9 % (ref 11.5–15.5)
WBC: 5.8 10*3/uL (ref 4.0–10.5)
nRBC: 0 % (ref 0.0–0.2)

## 2019-11-16 LAB — D-DIMER, QUANTITATIVE: D-Dimer, Quant: <0.27 ug/mL-FEU (ref 0.00–0.50)

## 2019-11-16 LAB — SARS CORONAVIRUS 2 (TAT 6-24 HRS): SARS Coronavirus 2: NEGATIVE

## 2019-11-16 MED ORDER — SODIUM CHLORIDE 0.9 % IV SOLN: 250.0000 mL | INTRAVENOUS | Status: DC | PRN

## 2019-11-16 MED ORDER — METOPROLOL SUCCINATE ER 25 MG PO TB24: 100.0000 mg | ORAL_TABLET | Freq: Every day | ORAL | Status: DC

## 2019-11-16 MED ORDER — ONDANSETRON HCL 4 MG/2ML IJ SOLN: 4.0000 mg | Freq: Four times a day (QID) | INTRAMUSCULAR | Status: DC | PRN

## 2019-11-16 MED ORDER — CLOPIDOGREL BISULFATE 75 MG PO TABS: 75.0000 mg | ORAL_TABLET | Freq: Every day | ORAL | Status: DC

## 2019-11-16 MED ORDER — GABAPENTIN 400 MG PO CAPS: 800.0000 mg | ORAL_CAPSULE | Freq: Every day | ORAL | Status: DC

## 2019-11-16 MED ORDER — PREGABALIN 50 MG PO CAPS: 100.0000 mg | ORAL_CAPSULE | Freq: Three times a day (TID) | ORAL | Status: DC

## 2019-11-16 MED ORDER — SODIUM CHLORIDE 0.9% FLUSH: 3.0000 mL | INTRAVENOUS | Status: DC | PRN

## 2019-11-16 MED ORDER — OXYCODONE HCL 5 MG PO TABS: 10.0000 mg | ORAL_TABLET | ORAL | Status: DC | PRN

## 2019-11-16 MED ORDER — SODIUM CHLORIDE 0.9% FLUSH: 3.0000 mL | Freq: Two times a day (BID) | INTRAVENOUS | Status: DC

## 2019-11-16 MED ORDER — BUDESONIDE 0.25 MG/2ML IN SUSP: 0.2500 mg | Freq: Two times a day (BID) | RESPIRATORY_TRACT | Status: DC

## 2019-11-16 MED ORDER — ATORVASTATIN CALCIUM 40 MG PO TABS: 80.0000 mg | ORAL_TABLET | Freq: Every morning | ORAL | Status: DC

## 2019-11-16 MED ORDER — LETROZOLE 2.5 MG PO TABS
2.5000 mg | ORAL_TABLET | Freq: Every day | ORAL | Status: DC
  Filled 2019-11-16 (×2): qty 1

## 2019-11-16 MED ORDER — ACETAMINOPHEN 325 MG PO TABS: 650.0000 mg | ORAL_TABLET | Freq: Four times a day (QID) | ORAL | Status: DC | PRN

## 2019-11-16 MED ORDER — ACETAMINOPHEN 500 MG PO TABS: 500.0000 mg | ORAL_TABLET | Freq: Four times a day (QID) | ORAL | Status: DC | PRN

## 2019-11-16 MED ORDER — ACETAMINOPHEN 650 MG RE SUPP: 650.0000 mg | Freq: Four times a day (QID) | RECTAL | Status: DC | PRN

## 2019-11-16 MED ORDER — ONDANSETRON HCL 4 MG PO TABS: 4.0000 mg | ORAL_TABLET | Freq: Four times a day (QID) | ORAL | Status: DC | PRN

## 2019-11-16 MED ORDER — TORSEMIDE 20 MG PO TABS
40.0000 mg | ORAL_TABLET | Freq: Every day | ORAL | Status: DC
  Filled 2019-11-16 (×3): qty 2

## 2019-11-16 MED ORDER — GABAPENTIN 300 MG PO CAPS: 300.0000 mg | ORAL_CAPSULE | Freq: Every morning | ORAL | Status: DC

## 2019-11-16 MED ORDER — SPIRONOLACTONE 12.5 MG HALF TABLET
12.5000 mg | ORAL_TABLET | Freq: Every day | ORAL | Status: DC
  Filled 2019-11-16 (×3): qty 1

## 2019-11-16 MED ORDER — LOSARTAN POTASSIUM 25 MG PO TABS: 100.0000 mg | ORAL_TABLET | Freq: Every day | ORAL | Status: DC

## 2019-11-16 MED ORDER — DULOXETINE HCL 30 MG PO CPEP: 60.0000 mg | ORAL_CAPSULE | Freq: Every day | ORAL | Status: DC

## 2019-11-16 NOTE — ED Notes (Signed)
Patient transported to bathroom via wheelchair and 1 person assist, patient sitting in wheelchair in room per request, call bell in reach, will continue to monitor.

## 2019-11-16 NOTE — Progress Notes (Addendum)
PROGRESS NOTE  Stacey [name] [id number] DOB: [birth year] DOA: 11/15/2019 PCP: Sharene Butters, MD  Brief History:  73 year old female with a history of coronary artery disease status post DES to the RCA December [id number], diastolic CHF, COPD, hypertension, metastatic breast cancer, hyperlipidemia, PSVT presenting with palpitations, elevated blood pressure, and generalized fatigue.  The patient took her blood pressure at home and noted to be elevated 174/68.  She had denied any frank chest discomfort but has been complaining of dyspnea on exertion that was worse on 11/15/2019.  She denied any fever, chills, nausea, vomiting, diarrhea, abdominal pain, coughing, hemoptysis.  The patient has been complaining some dysuria, but states that she has had dysuria for period of least 4 to 5 weeks.  She took 1 sublingual nitroglycerin without much relief of her symptoms.  She stated that she has had similar symptoms when she had a previous MI.  As result, she presented for further evaluation.  Assessment/Plan: Palpitations/dyspnea on exertion/coronary artery disease -Cardiology consult -Personally reviewed EKG--sinus LBBB -personally reviewed CXR--no infiltrates or edema -troponins flat, unremarkable -Continue Plavix -Continue warfarin -Check D-dimer  Essential hypertension -Continue metoprolol succinate and losartan  Chronic systolic CHF -Appears clinically euvolemic -12/01/2018 echo EF 20-25%, diffuse HK, mild AR/TR/MR -Continue torsemide and spironolactone -Continue losartan and metoprolol succinate  COPD -Stable on room air -Continue Pulmicort  Hyperlipidemia -Continue statin  Metastatic breast cancer -Follow-up [facility] oncology -Continue letrozole -Received Faslodex injection for metastatic breast cancer 12/15  Morbid Obesity -BMI 46.63 -lifestyle modification   Total time spent 35 minutes.  Greater than 50% spent face to face counseling and coordinating  care.   Disposition Plan:   Home 12/6 or 12/7 when cleared by cardiology Family Communication:   Family at bedside  Consultants:  cardiology  Code Status:  FULL   DVT Prophylaxis:  coumadin   Procedures: As Listed in Progress Note Above  Antibiotics: None       Subjective: Patient denies fevers, chills, headache, chest pain, dyspnea, nausea, vomiting, diarrhea, abdominal pain, dysuria, hematuria, hematochezia, and melena.   Objective: Vitals:   11/15/19 2100 11/16/19 0230 11/16/19 0630 11/16/19 0700  BP: 125/73 (!) 156/75 (!) 148/87 (!) 163/80  Pulse: 77 85 86   Resp: 10 13 (!) 22 17  Temp:      TempSrc:      SpO2: 95% 96% 95%   Weight:      Height:       No intake or output data in the 24 hours ending 11/16/19 0734 Weight change:  Exam:   General:  Pt is alert, follows commands appropriately, not in acute distress  HEENT: No icterus, No thrush, No neck mass, [place]/AT  Cardiovascular: RRR, S1/S2, no rubs, no gallops  Respiratory: CTA bilaterally, no wheezing, no crackles, no rhonchi  Abdomen: Soft/+BS, non tender, non distended, no guarding  Extremities: trace Le edema, No lymphangitis, No petechiae, No rashes, no synovitis   Data Reviewed: I have personally reviewed following labs and imaging studies Basic Metabolic Panel: Recent Labs  Lab 11/15/19 2224  NA 135  K 3.5  CL 97*  CO2 23  GLUCOSE 91  BUN 30*  CREATININE 0.89  CALCIUM 10.4*   Liver Function Tests: No results for input(s): AST, ALT, ALKPHOS, BILITOT, PROT, ALBUMIN in the last 168 hours. No results for input(s): LIPASE, AMYLASE in the last 168 hours. No results for input(s): AMMONIA in the last 168 hours. Coagulation Profile: Recent Labs  Lab 11/10/19 1022 11/15/19 2224 11/16/19 0552  INR 1.6* 1.8* 1.9*   CBC: Recent Labs  Lab 11/15/19 2224 11/16/19 0608  WBC 5.7 5.8  NEUTROABS 3.5  --   HGB 13.0 13.1  HCT 38.3 39.1  MCV 95.0 96.5  PLT 200 183   Cardiac  Enzymes: No results for input(s): CKTOTAL, CKMB, CKMBINDEX, TROPONINI in the last 168 hours. BNP: Invalid input(s): POCBNP CBG: Recent Labs  Lab 11/15/19 2223  GLUCAP 77   HbA1C: No results for input(s): HGBA1C in the last 72 hours. Urine analysis:    Component Value Date/Time   COLORURINE YELLOW 11/15/2019 2149   APPEARANCEUR HAZY (A) 11/15/2019 2149   LABSPEC 1.009 11/15/2019 2149   PHURINE 5.0 11/15/2019 2149   GLUCOSEU NEGATIVE 11/15/2019 2149   HGBUR NEGATIVE 11/15/2019 2149   [place] NEGATIVE 11/15/2019 2149   KETONESUR 20 (A) 11/15/2019 2149   PROTEINUR NEGATIVE 11/15/2019 2149   NITRITE NEGATIVE 11/15/2019 2149   LEUKOCYTESUR NEGATIVE 11/15/2019 2149   Sepsis Labs: @LABRCNTIP (procalcitonin:4,lacticidven:4) ) Recent Results (from the past 240 hour(s))  Wet prep     Status: None   Collection Time: 11/16/19  1:40 AM   Specimen: Vaginal; Genital  Result Value Ref Range Status   Yeast Wet Prep HPF POC NONE SEEN NONE SEEN Final   Trich, Wet Prep NONE SEEN NONE SEEN Final   Clue Cells Wet Prep HPF POC NONE SEEN NONE SEEN Final   WBC, Wet Prep HPF POC NONE SEEN NONE SEEN Final   Sperm NONE SEEN  Final    Comment: Performed at [facility], [address], [place], [place] [address]     Scheduled Meds: . atorvastatin  80 mg Oral q morning - 10a  . budesonide  0.25 mg Nebulization BID  . clopidogrel  75 mg Oral Daily  . DULoxetine  60 mg Oral Daily  . gabapentin  300 mg Oral q morning - 10a  . gabapentin  800 mg Oral QHS  . letrozole  2.5 mg Oral QHS  . losartan  100 mg Oral Daily  . metoprolol succinate  100 mg Oral Daily  . pregabalin  100 mg Oral TID  . sodium chloride flush  3 mL Intravenous Q12H  . spironolactone  12.5 mg Oral Daily  . torsemide  40 mg Oral Daily   Continuous Infusions: . sodium chloride      Procedures/Studies: DG Chest Portable 1 View  Result Date: 11/15/2019 CLINICAL DATA:  Palpitations EXAM: PORTABLE CHEST 1 VIEW COMPARISON:   Radiograph 11/30/2018, CT chest 10/06/2018 FINDINGS: Globally increased attenuation related to body habitus. No consolidation, features of edema, pneumothorax, or effusion. Pulmonary vascularity is normally distributed. The cardiomediastinal contours are unremarkable. No acute osseous or soft tissue abnormality. IMPRESSION: Accounting for body habitus, the lungs are clear. Electronically Signed   By: Lovena Le M.D.   On: 11/15/2019 22:40    Orson Eva, DO  [facility] Pager [phone]  If 7PM-7AM, please contact night-coverage [email or web address] Password [email or web address] 11/16/2019, 7:34 AM   LOS: 0 days

## 2019-11-16 NOTE — H&P (Signed)
TRH H&P    Patient Demographics:    Stacey [name], is a 73 y.o. female  MRN: [id number]  DOB - [birth year]  Admit Date - 11/15/2019  Referring MD/NP/PA: Evalee Jefferson  Outpatient Primary MD for the patient is Sharene Butters, MD  Patient coming from: Home  Chief complaint-palpitations   HPI:    Stacey [name]  is a 73 y.o. female, with history of CAD s/p DES to ramus, RCA, ischemic cardiomyopathy, EF 20 to 25%, mild aortic stenosis, atrial fibrillation on anticoagulation with warfarin, COPD, hypertension, metastatic breast cancer under the care of oncology at [facility] presented for evaluation of palpitations and high blood pressure which started this afternoon.  Patient said that she was not feeling well and started having high blood pressure with palpitations.  Felt lightheaded and took 1 nitroglycerin tablet prior to arrival.  Denies chest pain or shortness of breath.  Patient states that she had generalized vague symptoms during the previous MI so she got concerned.  She was seen this [facility] at [facility] where she received her Faslodex injection for metastatic breast cancer  She denies nausea vomiting or diarrhea. Denies abdominal pain Complains of dysuria, UA is clear.  Concern for vaginal infection.  Wet prep collected   Review of systems:    In addition to the HPI above,    All other systems reviewed and are negative.    Past History of the following :    Past Medical History:  Diagnosis Date  . CAD in native artery    a.DES to ramus 2005 with late stent thrombosis 2006 tx with PTCA. 12/18 PCI/DES x1 to mRCA, EF 50-55%  . CHF (congestive heart failure) ([place])   . Chronic pain   . COPD (chronic obstructive pulmonary disease) ([place])   . Hyperlipidemia   . Hypertension   . LBBB (left bundle branch block)   . Left bundle branch block   . Lymphedema   . Metastatic breast cancer  ([place])    a. to bone.  . MI (myocardial infarction) ([place])   . Mild aortic stenosis 10/2017  . Morbid obesity ([place])   . PSVT (paroxysmal supraventricular tachycardia) ([place])    a. per [facility] notes, seen on event monitor in 2014.  . Pulmonary nodules       Past Surgical History:  Procedure Laterality Date  . CORONARY STENT INTERVENTION N/A 11/26/2017   Procedure: CORONARY STENT INTERVENTION;  Surgeon: Martinique, Peter M, MD;  Location: [place] [facility];  Service: Cardiovascular;  Laterality: N/A;  . CORONARY STENT PLACEMENT    . LEFT HEART CATH AND CORONARY ANGIOGRAPHY N/A 11/26/2017   Procedure: LEFT HEART CATH AND CORONARY ANGIOGRAPHY;  Surgeon: Martinique, Peter M, MD;  Location: [facility];  Service: Cardiovascular;  Laterality: N/A;      Social History:      Social History   Tobacco Use  . Smoking status: Former Smoker    Packs/day: 2.00    Years: 18.00    Pack years: 36.00    Types: Cigarettes  Quit date: 12/01/1980    Years since quitting: 38.9  . Smokeless tobacco: Never Used  Substance Use Topics  . Alcohol use: No       Family History :     Family History  Problem Relation Age of Onset  . CAD Father 68  . Heart attack Father   . COPD Sister   . CAD Paternal Grandmother   . Sudden Cardiac Death Neg Hx       Home Medications:   Prior to Admission medications   Medication Sig Start Date End Date Taking? Authorizing Provider  acetaminophen (TYLENOL) 500 MG tablet Take 500 mg by mouth every 6 (six) hours as needed.    [provider]  atorvastatin (LIPITOR) 80 MG tablet Take 1 tablet (80 mg total) by mouth every morning. 03/25/19   Arnoldo Lenis, MD  b complex vitamins tablet Take 1 tablet by mouth every morning.     [provider]  budesonide (PULMICORT) 0.25 MG/2ML nebulizer solution Take 2 mLs (0.25 mg total) by nebulization 2 (two) times daily. 09/21/18 09/21/19  Erline Hau, MD  Calcium Citrate-Vitamin D  (CITRACAL MAXIMUM) [phone] MG-UNIT TABS Take 2 tablets by mouth daily.     [provider]  clopidogrel (PLAVIX) 75 MG tablet Take 1 tablet (75 mg total) by mouth daily. 03/25/19   Arnoldo Lenis, MD  diazepam (VALIUM) 5 MG tablet Take 0.25 mg by mouth every 6 (six) hours as needed for anxiety.    [provider]  DULoxetine (CYMBALTA) 60 MG capsule Take 1 capsule by mouth daily. 08/01/19   [provider]  gabapentin (NEURONTIN) 300 MG capsule Take 300 mg by mouth every morning.    [provider]  gabapentin (NEURONTIN) 400 MG capsule Take 800 mg by mouth at bedtime.    [provider]  letrozole (FEMARA) 2.5 MG tablet Take 2.5 mg by mouth at bedtime.     [provider]  lidocaine-prilocaine (EMLA) cream Apply topically. 06/28/19   [provider]  losartan (COZAAR) 100 MG tablet Take 1 tablet (100 mg total) by mouth daily. 03/25/19   Arnoldo Lenis, MD  metoprolol succinate (TOPROL-XL) 100 MG 24 hr tablet TAKE 1 TABLET BY MOUTH DAILY. TAKE WITH OR IMMEDIATELY FOLLOWING A MEAL 09/26/19   Arnoldo Lenis, MD  Multiple Vitamins-Minerals (EQ VISION FORMULA 50+ PO) Take 1 tablet by mouth every morning.    [provider]  Multiple Vitamins-Minerals (HAIR/SKIN/NAILS) CAPS Take 1 capsule by mouth every morning.    [provider]  nitroGLYCERIN (NITROSTAT) 0.4 MG SL tablet Place 1 tablet (0.4 mg total) under the tongue every 5 (five) minutes x 3 doses as needed for chest pain (if no relief after 3rd dose, proceed to the ED or call 911). 03/09/19   Branch, Alphonse Guild, MD  oxyCODONE (OXY IR/ROXICODONE) 5 MG immediate release tablet Take 5 mg by mouth every 6 (six) hours as needed.  06/15/19   [provider]  Oxycodone HCl 10 MG TABS Take 10 mg by mouth every 4 (four) hours as needed (for pain).     [provider]  Potassium 99 MG TABS Take 2 tablets by mouth every morning.     [provider]    pregabalin (LYRICA) 100 MG capsule Take 1 capsule by mouth 3 (three) times daily. 08/01/19   [provider]  spironolactone (ALDACTONE) 25 MG tablet TAKE 1/2 TABLET(12.5 MG) BY MOUTH DAILY 07/01/19   Branch,  Alphonse Guild, MD  torsemide (DEMADEX) 20 MG tablet Take 2 tablets (40 mg total) by mouth daily. 08/24/19 11/22/19  Arnoldo Lenis, MD  Ubiquinol (QUNOL COQ10/UBIQUINOL/MEGA) 100 MG CAPS Take 1 capsule by mouth every morning. Mertie Moores    [provider]  warfarin (COUMADIN) 1 MG tablet Take 1 tablet daily except 1/2 tablets on Tuesdays and Saturdays or as directed by Coumadin clinic 09/26/19   Arnoldo Lenis, MD     Allergies:     Allergies  Allergen Reactions  . Brilinta [Ticagrelor] Other (See Comments)    Nausea and severe diarrhea- general weakness. Patient says does not want to take again  . Albuterol Other (See Comments)    Heart racing      Physical Exam:   Vitals  Blood pressure 125/73, pulse 77, temperature 97.9 F (36.6 C), temperature source Oral, resp. rate 10, height 5' (1.524 m), weight 108.3 kg, SpO2 95 %.  1.  General: Appears in no acute distress  2. Psychiatric: Alert, oriented x3, intact insight and judgment  3. Neurologic: Cranial nerves II through XII grossly intact, moving all extremities  4. HEENMT:  Atraumatic normocephalic, extraocular muscles are intact  5. Respiratory : Clear to auscultation bilaterally  6. Cardiovascular : S1-S2, regular, no murmur auscultated  7. Gastrointestinal:  Abdomen is soft, nontender, no organomegaly      Data Review:    CBC Recent Labs  Lab 11/15/19 2224  WBC 5.7  HGB 13.0  HCT 38.3  PLT 200  MCV 95.0  MCH 32.3  MCHC 33.9  RDW 13.8  LYMPHSABS 1.5  MONOABS 0.6  EOSABS 0.2  BASOSABS 0.0   ------------------------------------------------------------------------------------------------------------------  Results for orders placed or performed during the  hospital encounter of 11/15/19 (from the past 48 hour(s))  Urinalysis, Routine w reflex microscopic     Status: Abnormal   Collection Time: 11/15/19  9:49 PM  Result Value Ref Range   Color, Urine YELLOW YELLOW   APPearance HAZY (A) CLEAR   Specific Gravity, Urine 1.009 1.005 - 1.030   pH 5.0 5.0 - 8.0   Glucose, UA NEGATIVE NEGATIVE mg/dL   Hgb urine dipstick NEGATIVE NEGATIVE   Bilirubin Urine NEGATIVE NEGATIVE   Ketones, ur 20 (A) NEGATIVE mg/dL   Protein, ur NEGATIVE NEGATIVE mg/dL   Nitrite NEGATIVE NEGATIVE   Leukocytes,Ua NEGATIVE NEGATIVE    Comment: Performed at [facility], [address] [place], [place] [address]  CBG monitoring, ED     Status: None   Collection Time: 11/15/19 10:23 PM  Result Value Ref Range   Glucose-Capillary 77 70 - 99 mg/dL  Basic metabolic panel     Status: Abnormal   Collection Time: 11/15/19 10:24 PM  Result Value Ref Range   Sodium 135 135 - 145 mmol/L   Potassium 3.5 3.5 - 5.1 mmol/L   Chloride 97 (L) 98 - 111 mmol/L   CO2 23 22 - 32 mmol/L   Glucose, Bld 91 70 - 99 mg/dL   BUN 30 (H) 8 - 23 mg/dL   Creatinine, Ser 0.89 0.44 - 1.00 mg/dL   Calcium 10.4 (H) 8.9 - 10.3 mg/dL   GFR calc non Af Amer >60 >60 mL/min   GFR calc Af Amer >60 >60 mL/min   Anion gap 15 5 - 15    Comment: Performed at [facility], [address] [place], [place] [address]  Troponin I (High Sensitivity)     Status: None   Collection Time: 11/15/19 10:24 PM  Result Value Ref Range   Troponin I (High Sensitivity) 13 <18 ng/L    Comment: (NOTE) Elevated high sensitivity troponin I (hsTnI) values and significant  changes across serial measurements may suggest ACS but many other  chronic and acute conditions are known to elevate hsTnI results.  Refer to the "Links" section for chest pain algorithms and additional  guidance. Performed at [facility], [address] [place], [place] [address]   CBC with Differential/Platelet     Status: None   Collection Time:  11/15/19 10:24 PM  Result Value Ref Range   WBC 5.7 4.0 - 10.5 K/uL   RBC 4.03 3.87 - 5.11 MIL/uL   Hemoglobin 13.0 12.0 - 15.0 g/dL   HCT 38.3 36.0 - 46.0 %   MCV 95.0 80.0 - 100.0 fL   MCH 32.3 26.0 - 34.0 pg   MCHC 33.9 30.0 - 36.0 g/dL   RDW 13.8 11.5 - 15.5 %   Platelets 200 150 - 400 K/uL   nRBC 0.0 0.0 - 0.2 %   Neutrophils Relative % 60 %   Neutro Abs 3.5 1.7 - 7.7 K/uL   Lymphocytes Relative 26 %   Lymphs Abs 1.5 0.7 - 4.0 K/uL   Monocytes Relative 10 %   Monocytes Absolute 0.6 0.1 - 1.0 K/uL   Eosinophils Relative 3 %   Eosinophils Absolute 0.2 0.0 - 0.5 K/uL   Basophils Relative 1 %   Basophils Absolute 0.0 0.0 - 0.1 K/uL   Immature Granulocytes 0 %   Abs Immature Granulocytes 0.02 0.00 - 0.07 K/uL    Comment: Performed at [facility], [address] [place], [place] [address]  Protime-INR     Status: Abnormal   Collection Time: 11/15/19 10:24 PM  Result Value Ref Range   Prothrombin Time 20.6 (H) 11.4 - 15.2 seconds   INR 1.8 (H) 0.8 - 1.2    Comment: (NOTE) INR goal varies based on device and disease states. Performed at [facility], [address] [place], [place] [address]   Troponin I (High Sensitivity)     Status: None   Collection Time: 11/16/19 12:50 AM  Result Value Ref Range   Troponin I (High Sensitivity) 11 <18 ng/L    Comment: (NOTE) Elevated high sensitivity troponin I (hsTnI) values and significant  changes across serial measurements may suggest ACS but many other  chronic and acute conditions are known to elevate hsTnI results.  Refer to the "Links" section for chest pain algorithms and additional  guidance. Performed at [facility], [address] [place], [place] [address]   Wet prep     Status: None   Collection Time: 11/16/19  1:40 AM   Specimen: Vaginal; Genital  Result Value Ref Range   Yeast Wet Prep HPF POC NONE SEEN NONE SEEN   Trich, Wet Prep NONE SEEN NONE SEEN   Clue Cells Wet Prep HPF POC NONE SEEN NONE SEEN   WBC, Wet  Prep HPF POC NONE SEEN NONE SEEN   Sperm NONE SEEN     Comment: Performed at [facility], [address] [place], [place] [address]    Chemistries  Recent Labs  Lab 11/15/19 2224  NA 135  K 3.5  CL 97*  CO2 23  GLUCOSE 91  BUN 30*  CREATININE 0.89  CALCIUM 10.4*   ------------------------------------------------------------------------------------------------------------------  ------------------------------------------------------------------------------------------------------------------ GFR: Estimated Creatinine Clearance: 63.7 mL/min (by C-G formula based on SCr of 0.89 mg/dL). Liver Function Tests: No results for input(s): AST, ALT, ALKPHOS,  BILITOT, PROT, ALBUMIN in the last 168 hours. No results for input(s): LIPASE, AMYLASE in the last 168 hours. No results for input(s): AMMONIA in the last 168 hours. Coagulation Profile: Recent Labs  Lab 11/10/19 1022 11/15/19 2224  INR 1.6* 1.8*   Cardiac Enzymes: No results for input(s): CKTOTAL, CKMB, CKMBINDEX, TROPONINI in the last 168 hours. BNP (last 3 results) No results for input(s): PROBNP in the last 8760 hours. HbA1C: No results for input(s): HGBA1C in the last 72 hours. CBG: Recent Labs  Lab 11/15/19 2223  GLUCAP 77   Lipid Profile: No results for input(s): CHOL, HDL, LDLCALC, TRIG, CHOLHDL, LDLDIRECT in the last 72 hours. Thyroid Function Tests: No results for input(s): TSH, T4TOTAL, FREET4, T3FREE, THYROIDAB in the last 72 hours. Anemia Panel: No results for input(s): VITAMINB12, FOLATE, FERRITIN, TIBC, IRON, RETICCTPCT in the last 72 hours.  --------------------------------------------------------------------------------------------------------------- Urine analysis:    Component Value Date/Time   COLORURINE YELLOW 11/15/2019 2149   APPEARANCEUR HAZY (A) 11/15/2019 2149   LABSPEC 1.009 11/15/2019 2149   PHURINE 5.0 11/15/2019 2149   GLUCOSEU NEGATIVE 11/15/2019 2149   HGBUR NEGATIVE 11/15/2019  2149   [place] NEGATIVE 11/15/2019 2149   KETONESUR 20 (A) 11/15/2019 2149   PROTEINUR NEGATIVE 11/15/2019 2149   NITRITE NEGATIVE 11/15/2019 2149   LEUKOCYTESUR NEGATIVE 11/15/2019 2149      Imaging Results:    DG Chest Portable 1 View  Result Date: 11/15/2019 CLINICAL DATA:  Palpitations EXAM: PORTABLE CHEST 1 VIEW COMPARISON:  Radiograph 11/30/2018, CT chest 10/06/2018 FINDINGS: Globally increased attenuation related to body habitus. No consolidation, features of edema, pneumothorax, or effusion. Pulmonary vascularity is normally distributed. The cardiomediastinal contours are unremarkable. No acute osseous or soft tissue abnormality. IMPRESSION: Accounting for body habitus, the lungs are clear. Electronically Signed   By: Lovena Le M.D.   On: 11/15/2019 22:40    My personal review of EKG: Rhythm NSR, LBBB (old)   Assessment & Plan:    Active Problems:   Palpitations   1. Palpitations-placed under observation to rule out ACS.  Patient states that she had previous MI with palpitation and shortness of breath.  Did not have chest pain at that time.  Cardiac enzymes x2 have been negative.  Follow troponin x 2.  2. Dysuria-urine is clear, wet prep was unremarkable.  3. Atrial fibrillation-heart rate is controlled, continue Toprol-XL, Coumadin per pharmacy consultation.  4. CAD-s/p DES x2, continue Plavix, Lipitor.  5. Metastatic breast cancer-continue Femara    DVT Prophylaxis-Coumadin  AM Labs Ordered, also please review Full Orders  Family Communication: Admission, patients condition and plan of care including tests being ordered have been discussed with the patient  who indicate understanding and agree with the plan and Code Status.  Code Status: Full code  Admission status: Observation: Based on patients clinical presentation and evaluation of above clinical data, I have made determination that patient may need less than 2 midnight stay in the hospital.  Time  spent in minutes : 60 minutes   Kelseigh Diver S Rosalee Tolley M.D

## 2019-11-16 NOTE — ED Notes (Signed)
Pt was given breakfast tray 

## 2019-11-16 NOTE — Discharge Summary (Signed)
Physician Discharge Summary  Stacey [name] [id number] DOB: 21-Feb-1946 DOA: 11/15/2019  PCP: Sharene Butters, MD  Admit date: 11/15/2019 Discharge date: 11/16/2019  Admitted From: Home Disposition:  Home   Recommendations for Outpatient Follow-up:  1. Follow up with PCP in 1-2 weeks 2. Please obtain BMP/CBC in one week     Discharge Condition: Stable CODE STATUS: DNI Diet recommendation: Heart Healthy    Brief/Interim Summary: 73 year old female with a history of coronary artery disease status post DES to the RCA December [id number], diastolic CHF, COPD, hypertension, metastatic breast cancer, hyperlipidemia, PSVT presenting with palpitations, elevated blood pressure, and generalized fatigue.  The patient took her blood pressure at home and noted to be elevated 174/68.  She had denied any frank chest discomfort but has been complaining of dyspnea on exertion that was worse on 11/15/2019.  She denied any fever, chills, nausea, vomiting, diarrhea, abdominal pain, coughing, hemoptysis.  The patient has been complaining some dysuria, but states that she has had dysuria for period of least 4 to 5 weeks.  She took 1 sublingual nitroglycerin without much relief of her symptoms.  She stated that she has had similar symptoms when she had a previous MI.  As result, she presented for further evaluation.  Discharge Diagnoses:   Palpitations/dyspnea on exertion/coronary artery disease -Cardiology consult appreciated -case discussed with Dr. Vonita Moss to d/c home with follow up with Dr. Harl Bowie 11/17/19 with event monitor placement -Personally reviewed EKG--sinus LBBB -personally reviewed CXR--no infiltrates or edema -troponins flat, unremarkable -Continue Plavix -Continue warfarin -Check D-dimer--neg  Essential hypertension -Continue metoprolol succinate and losartan  Chronic systolic CHF -Appears clinically euvolemic -12/01/2018 echo EF 20-25%, diffuse HK, mild  AR/TR/MR -Continue torsemide and spironolactone -Continue losartan and metoprolol succinate  COPD -Stable on room air -Continue Pulmicort  Hyperlipidemia -Continue statin  Metastatic breast cancer -Follow-up [facility] -Continue letrozole -Received Faslodex injection for metastatic breast cancer 12/15  Morbid Obesity -BMI 46.63 -lifestyle modification  Discharge Instructions   Allergies as of 11/16/2019      Reactions   Brilinta [ticagrelor] Other (See Comments)   Nausea and severe diarrhea- general weakness. Patient says does not want to take again   Albuterol Other (See Comments)   Heart racing      Medication List    TAKE these medications   acetaminophen 500 MG tablet Commonly known as: TYLENOL Take 500 mg by mouth every 6 (six) hours as needed.   atorvastatin 80 MG tablet Commonly known as: LIPITOR Take 1 tablet (80 mg total) by mouth every morning.   b complex vitamins tablet Take 1 tablet by mouth every morning.   budesonide 0.25 MG/2ML nebulizer solution Commonly known as: PULMICORT Take 2 mLs (0.25 mg total) by nebulization 2 (two) times daily.   Citracal Maximum 315-250 MG-UNIT Tabs Generic drug: Calcium Citrate-Vitamin D Take 2 tablets by mouth daily.   clopidogrel 75 MG tablet Commonly known as: PLAVIX Take 1 tablet (75 mg total) by mouth daily.   diazepam 5 MG tablet Commonly known as: VALIUM Take 0.25 mg by mouth every 6 (six) hours as needed for anxiety.   DULoxetine 60 MG capsule Commonly known as: CYMBALTA Take 1 capsule by mouth daily.   EQ VISION FORMULA 50+ PO Take 1 tablet by mouth every morning.   Hair/Skin/Nails Caps Take 1 capsule by mouth every morning.   gabapentin 300 MG capsule Commonly known as: NEURONTIN Take 300 mg by mouth every morning.   gabapentin 400 MG capsule Commonly known  as: NEURONTIN Take 800 mg by mouth at bedtime.   letrozole 2.5 MG tablet Commonly known as: FEMARA Take 2.5 mg by mouth  at bedtime.   lidocaine-prilocaine cream Commonly known as: EMLA Apply topically.   losartan 100 MG tablet Commonly known as: COZAAR Take 1 tablet (100 mg total) by mouth daily.   metoprolol succinate 100 MG 24 hr tablet Commonly known as: TOPROL-XL TAKE 1 TABLET BY MOUTH DAILY. TAKE WITH OR IMMEDIATELY FOLLOWING A MEAL   nitroGLYCERIN 0.4 MG SL tablet Commonly known as: Nitrostat Place 1 tablet (0.4 mg total) under the tongue every 5 (five) minutes x 3 doses as needed for chest pain (if no relief after 3rd dose, proceed to the ED or call 911).   Oxycodone HCl 10 MG Tabs Take 10 mg by mouth every 4 (four) hours as needed (for pain).   oxyCODONE 5 MG immediate release tablet Commonly known as: Oxy IR/ROXICODONE Take 5 mg by mouth every 6 (six) hours as needed.   Potassium 99 MG Tabs Take 2 tablets by mouth every morning.   pregabalin 100 MG capsule Commonly known as: LYRICA Take 1 capsule by mouth 3 (three) times daily.   Qunol CoQ10/Ubiquinol/Mega 100 MG Caps Generic drug: Ubiquinol Take 1 capsule by mouth every morning. QUNOL-Ultra COQ10   spironolactone 25 MG tablet Commonly known as: ALDACTONE TAKE 1/2 TABLET(12.5 MG) BY MOUTH DAILY   torsemide 20 MG tablet Commonly known as: DEMADEX Take 2 tablets (40 mg total) by mouth daily.   warfarin 1 MG tablet Commonly known as: COUMADIN Take as directed. If you are unsure how to take this medication, talk to your nurse or doctor. Original instructions: Take 1 tablet daily except 1/2 tablets on Tuesdays and Saturdays or as directed by Coumadin clinic       Allergies  Allergen Reactions  . Brilinta [Ticagrelor] Other (See Comments)    Nausea and severe diarrhea- general weakness. Patient says does not want to take again  . Albuterol Other (See Comments)    Heart racing     Consultations:  cardiology   Procedures/Studies: DG Chest Portable 1 View  Result Date: 11/15/2019 CLINICAL DATA:  Palpitations EXAM:  PORTABLE CHEST 1 VIEW COMPARISON:  Radiograph 11/30/2018, CT chest 10/06/2018 FINDINGS: Globally increased attenuation related to body habitus. No consolidation, features of edema, pneumothorax, or effusion. Pulmonary vascularity is normally distributed. The cardiomediastinal contours are unremarkable. No acute osseous or soft tissue abnormality. IMPRESSION: Accounting for body habitus, the lungs are clear. Electronically Signed   By: Lovena Le M.D.   On: 11/15/2019 22:40        Discharge Exam: Vitals:   11/16/19 0750 11/16/19 0800  BP: (!) 144/70 (!) 132/109  Pulse: 92 86  Resp: 13 15  Temp:    SpO2: 96% 100%   Vitals:   11/16/19 0630 11/16/19 0700 11/16/19 0750 11/16/19 0800  BP: (!) 148/87 (!) 163/80 (!) 144/70 (!) 132/109  Pulse: 86  92 86  Resp: (!) 22 17 13 15   Temp:      TempSrc:      SpO2: 95%  96% 100%  Weight:      Height:        General: Pt is alert, awake, not in acute distress Cardiovascular: RRR, S1/S2 +, no rubs, no gallops Respiratory: CTA bilaterally, no wheezing, no rhonchi Abdominal: Soft, NT, ND, bowel sounds + Extremities: no edema, no cyanosis   The results of significant diagnostics from this hospitalization (including imaging, microbiology, ancillary and  laboratory) are listed below for reference.    Significant Diagnostic Studies: DG Chest Portable 1 View  Result Date: 11/15/2019 CLINICAL DATA:  Palpitations EXAM: PORTABLE CHEST 1 VIEW COMPARISON:  Radiograph 11/30/2018, CT chest 10/06/2018 FINDINGS: Globally increased attenuation related to body habitus. No consolidation, features of edema, pneumothorax, or effusion. Pulmonary vascularity is normally distributed. The cardiomediastinal contours are unremarkable. No acute osseous or soft tissue abnormality. IMPRESSION: Accounting for body habitus, the lungs are clear. Electronically Signed   By: Lovena Le M.D.   On: 11/15/2019 22:40     Microbiology: Recent Results (from the past 240  hour(s))  Wet prep     Status: None   Collection Time: 11/16/19  1:40 AM   Specimen: Vaginal; Genital  Result Value Ref Range Status   Yeast Wet Prep HPF POC NONE SEEN NONE SEEN Final   Trich, Wet Prep NONE SEEN NONE SEEN Final   Clue Cells Wet Prep HPF POC NONE SEEN NONE SEEN Final   WBC, Wet Prep HPF POC NONE SEEN NONE SEEN Final   Sperm NONE SEEN  Final    Comment: Performed at [facility], [address] [place],  [address]     Labs: Basic Metabolic Panel: Recent Labs  Lab 11/15/19 2224 11/16/19 0608  NA 135 137  K 3.5 3.5  CL 97* 97*  CO2 23 24  GLUCOSE 91 97  BUN 30* 29*  CREATININE 0.89 0.92  CALCIUM 10.4* 10.2   Liver Function Tests: Recent Labs  Lab 11/16/19 0608  AST 38  ALT 30  ALKPHOS 44  BILITOT 1.5*  PROT 7.0  ALBUMIN 3.8   No results for input(s): LIPASE, AMYLASE in the last 168 hours. No results for input(s): AMMONIA in the last 168 hours. CBC: Recent Labs  Lab 11/15/19 2224 11/16/19 0608  WBC 5.7 5.8  NEUTROABS 3.5  --   HGB 13.0 13.1  HCT 38.3 39.1  MCV 95.0 96.5  PLT 200 183   Cardiac Enzymes: No results for input(s): CKTOTAL, CKMB, CKMBINDEX, TROPONINI in the last 168 hours. BNP: Invalid input(s): POCBNP CBG: Recent Labs  Lab 11/15/19 2223  GLUCAP 77    Time coordinating discharge:  36 minutes  Signed:  Orson Eva, DO [facility] Pager: [phone] 11/16/2019, 8:58 AM

## 2019-11-16 NOTE — Consult Note (Addendum)
Cardiology Consult    Patient ID: Stacey [name]; [id number]; [birth year]   Admit date: 11/15/2019 Date of Consult: 11/16/2019  Primary Care Provider: Sharene Butters, MD Primary Cardiologist: Carlyle Dolly, MD   Patient Profile    Stacey [name] is a 73 y.o. female with past medical history of chronic systolic CHF (EF [id number] in 12/2017, at 20-25% in 12/2018 --> ICD placement not pursued given comorbidities), CAD (s/p DES to [place] in 2005, DES to [facility] in 10/2017 with CTO of RI noted), paroxysmal atrial fibrillation, HTN, HLD, LBBB and breast cancer (with known bone mets and renal lesion --> followed by [facility])  who is being seen today for the evaluation of dyspnea and palpitations at the request of Dr. Carles Collet.   History of Present Illness    Ms. Kocsis was last examined by Dr. Harl Bowie in 08/2019 and reported some dyspnea on exertion and lower extremity edema. Weight had increased from 246 lbs in 06/2019 to 259 lbs at the time of her visit. She was continued on her current regimen at that time including Atorvastatin 80 mg daily, Plavix 75 mg daily, Lasix 80 mg daily, losartan 100 mg daily, Toprol-XL 100 mg daily, Spironolactone 12.5 mg daily and Coumadin. She had previously not been interested in [facility] secondary to cost.  She presented to [facility] ED on 11/15/2019 for evaluation of elevated BP and associated palpitations and dizziness  In talking with the patient today, she reports being in her usual state of health until after she received her cancer injection at [facility] yesterday (by review of Care Everywhere, received Faslodex which is an estrogen receptor antagonist). She reports having been on the injections for over a year and has never had significant side effects to this. She says that upon returning home she felt significantly weak and fatigued, therefore she took a nap. Upon waking from this she reports some dizziness along with associated palpitations. She checked her blood  pressure and this was significantly elevated, therefore she took a sublingual nitroglycerin tablet and repeat BP was further elevated. She does mention her tablet was over 29 years old. She denies any associated chest pain. Says that her palpitations felt very different from her prior episodes of atrial fibrillation. No recent orthopnea, PND or lower extremity edema. Weight has actually declined by 20 lbs within the past two months due to significant dietary changes. She is following with a Nutritionist at [facility] and is trying to lose 50+ lbs so she can be considered for knee replacement surgery.   Initial labs showed WBC 5.7, Hgb 13.0, platelets 200, Na+ 135, K+ 3.5 and creatinine 0.89. Initial and delta troponin values have been negative at 13 , 11 and 12. D-dimer and COVID testing pending.  CXR with no acute findings. EKG showing NSR, HR 82 with known LBBB.   Past Medical History:  Diagnosis Date  . CAD in native artery    a. DES to ramus 2005 with late stent thrombosis 2006 tx with PTCA. 12/18 PCI/DES x1 to mRCA, EF 50-55%  . CHF (congestive heart failure) ([place])   . Chronic pain   . COPD (chronic obstructive pulmonary disease) ([place])   . Hyperlipidemia   . Hypertension   . Left bundle branch block   . Lymphedema   . Metastatic breast cancer ([place])    a. to bone.  . MI (myocardial infarction) ([place])   . Mild aortic stenosis 10/2017  . Morbid obesity ([place])   . PAF (paroxysmal atrial  fibrillation) ([place])   . PSVT (paroxysmal supraventricular tachycardia) ([place])    a. per [facility] notes, seen on event monitor in 2014.  . Pulmonary nodules     Past Surgical History:  Procedure Laterality Date  . CORONARY STENT INTERVENTION N/A 11/26/2017   Procedure: CORONARY STENT INTERVENTION;  Surgeon: Martinique, Peter M, MD;  Location: [place] [facility];  Service: Cardiovascular;  Laterality: N/A;  . CORONARY STENT PLACEMENT    . LEFT HEART CATH AND CORONARY ANGIOGRAPHY N/A 11/26/2017   Procedure: LEFT HEART  CATH AND CORONARY ANGIOGRAPHY;  Surgeon: Martinique, Peter M, MD;  Location: [place] [facility];  Service: Cardiovascular;  Laterality: N/A;     Home Medications:  Prior to Admission medications   Medication Sig Start Date End Date Taking? Authorizing Provider  acetaminophen (TYLENOL) 500 MG tablet Take 500 mg by mouth every 6 (six) hours as needed.    [provider]  atorvastatin (LIPITOR) 80 MG tablet Take 1 tablet (80 mg total) by mouth every morning. 03/25/19   Arnoldo Lenis, MD  b complex vitamins tablet Take 1 tablet by mouth every morning.     [provider]  budesonide (PULMICORT) 0.25 MG/2ML nebulizer solution Take 2 mLs (0.25 mg total) by nebulization 2 (two) times daily. 09/21/18 09/21/19  Erline Hau, MD  Calcium Citrate-Vitamin D (CITRACAL MAXIMUM) [phone] MG-UNIT TABS Take 2 tablets by mouth daily.     [provider]  clopidogrel (PLAVIX) 75 MG tablet Take 1 tablet (75 mg total) by mouth daily. 03/25/19   Arnoldo Lenis, MD  diazepam (VALIUM) 5 MG tablet Take 0.25 mg by mouth every 6 (six) hours as needed for anxiety.    [provider]  DULoxetine (CYMBALTA) 60 MG capsule Take 1 capsule by mouth daily. 08/01/19   [provider]  gabapentin (NEURONTIN) 300 MG capsule Take 300 mg by mouth every morning.    [provider]  gabapentin (NEURONTIN) 400 MG capsule Take 800 mg by mouth at bedtime.    [provider]  letrozole (FEMARA) 2.5 MG tablet Take 2.5 mg by mouth at bedtime.     [provider]  lidocaine-prilocaine (EMLA) cream Apply topically. 06/28/19   [provider]  losartan (COZAAR) 100 MG tablet Take 1 tablet (100 mg total) by mouth daily. 03/25/19   Arnoldo Lenis, MD  metoprolol succinate (TOPROL-XL) 100 MG 24 hr tablet TAKE 1 TABLET BY MOUTH DAILY. TAKE WITH OR IMMEDIATELY FOLLOWING A MEAL 09/26/19   Arnoldo Lenis, MD  Multiple Vitamins-Minerals (EQ VISION  FORMULA 50+ PO) Take 1 tablet by mouth every morning.    [provider]  Multiple Vitamins-Minerals (HAIR/SKIN/NAILS) CAPS Take 1 capsule by mouth every morning.    [provider]  nitroGLYCERIN (NITROSTAT) 0.4 MG SL tablet Place 1 tablet (0.4 mg total) under the tongue every 5 (five) minutes x 3 doses as needed for chest pain (if no relief after 3rd dose, proceed to the ED or call 911). 03/09/19   Branch, Alphonse Guild, MD  oxyCODONE (OXY IR/ROXICODONE) 5 MG immediate release tablet Take 5 mg by mouth every 6 (six) hours as needed.  06/15/19   [provider]  Oxycodone HCl 10 MG TABS Take 10 mg by mouth every 4 (four) hours as needed (for pain).     [provider]  Potassium 99 MG TABS Take 2 tablets by mouth every morning.     [provider]  pregabalin (LYRICA) 100  MG capsule Take 1 capsule by mouth 3 (three) times daily. 08/01/19   [provider]  spironolactone (ALDACTONE) 25 MG tablet TAKE 1/2 TABLET(12.5 MG) BY MOUTH DAILY 07/01/19   Arnoldo Lenis, MD  torsemide (DEMADEX) 20 MG tablet Take 2 tablets (40 mg total) by mouth daily. 08/24/19 11/22/19  Arnoldo Lenis, MD  Ubiquinol (QUNOL COQ10/UBIQUINOL/MEGA) 100 MG CAPS Take 1 capsule by mouth every morning. Mertie Moores    [provider]  warfarin (COUMADIN) 1 MG tablet Take 1 tablet daily except 1/2 tablets on Tuesdays and Saturdays or as directed by Coumadin clinic 09/26/19   Arnoldo Lenis, MD    Inpatient Medications: Scheduled Meds: . atorvastatin  80 mg Oral q morning - 10a  . budesonide  0.25 mg Nebulization BID  . clopidogrel  75 mg Oral Daily  . DULoxetine  60 mg Oral Daily  . gabapentin  300 mg Oral q morning - 10a  . gabapentin  800 mg Oral QHS  . letrozole  2.5 mg Oral QHS  . losartan  100 mg Oral Daily  . metoprolol succinate  100 mg Oral Daily  . pregabalin  100 mg Oral TID  . sodium chloride flush  3 mL Intravenous Q12H  . spironolactone  12.5  mg Oral Daily  . torsemide  40 mg Oral Daily   Continuous Infusions: . sodium chloride     PRN Meds: sodium chloride, acetaminophen **OR** acetaminophen, acetaminophen, ondansetron **OR** ondansetron (ZOFRAN) IV, oxyCODONE, sodium chloride flush  Allergies:    Allergies  Allergen Reactions  . Brilinta [Ticagrelor] Other (See Comments)    Nausea and severe diarrhea- general weakness. Patient says does not want to take again  . Albuterol Other (See Comments)    Heart racing     Social History:   Social History   Socioeconomic History  . Marital status: Single    Spouse name: Not on file  . Number of children: Not on file  . Years of education: Not on file  . Highest education level: Not on file  Occupational History  . Occupation: Retired  Tobacco Use  . Smoking status: Former Smoker    Packs/day: 2.00    Years: 18.00    Pack years: 36.00    Types: Cigarettes    Quit date: 12/01/1980    Years since quitting: 38.9  . Smokeless tobacco: Never Used  Substance and Sexual Activity  . Alcohol use: No  . Drug use: No  . Sexual activity: Not on file  Other Topics Concern  . Not on file  Social History Narrative  . Not on file   Social Determinants of Health   Financial Resource Strain:   . Difficulty of Paying Living Expenses: Not on file  Food Insecurity:   . Worried About Charity fundraiser in the Last Year: Not on file  . Ran Out of Food in the Last Year: Not on file  Transportation Needs:   . Lack of Transportation (Medical): Not on file  . Lack of Transportation (Non-Medical): Not on file  Physical Activity:   . Days of Exercise per Week: Not on file  . Minutes of Exercise per Session: Not on file  Stress:   . Feeling of Stress : Not on file  Social Connections:   . Frequency of Communication with Friends and Family: Not on file  . Frequency of Social Gatherings with Friends and Family: Not on file  . Attends Religious Services: Not on file  .  Active  Member of Clubs or Organizations: Not on file  . Attends Archivist Meetings: Not on file  . Marital Status: Not on file  Intimate Partner Violence:   . Fear of Current or Ex-Partner: Not on file  . Emotionally Abused: Not on file  . Physically Abused: Not on file  . Sexually Abused: Not on file     Family History:    Family History  Problem Relation Age of Onset  . CAD Father 110  . Heart attack Father   . COPD Sister   . CAD Paternal Grandmother   . Sudden Cardiac Death Neg Hx       Review of Systems    General:  No chills, fever, night sweats or weight changes.  Cardiovascular:  No chest pain, dyspnea on exertion, edema, orthopnea, paroxysmal nocturnal dyspnea. Positive for palpitations and dizziness.  Dermatological: No rash, lesions/masses Respiratory: No cough, dyspnea Urologic: No hematuria, dysuria Abdominal:   No nausea, vomiting, diarrhea, bright red blood per rectum, melena, or hematemesis Neurologic:  No visual changes, wkns, changes in mental status. All other systems reviewed and are otherwise negative except as noted above.  Physical Exam/Data    Vitals:   11/16/19 0630 11/16/19 0700 11/16/19 0750 11/16/19 0800  BP: (!) 148/87 (!) 163/80 (!) 144/70 (!) 132/109  Pulse: 86  92 86  Resp: (!) 22 17 13 15   Temp:      TempSrc:      SpO2: 95%  96% 100%  Weight:      Height:       No intake or output data in the 24 hours ending 11/16/19 0926 Filed Weights   11/15/19 2005  Weight: 108.3 kg   Body mass index is 46.63 kg/m.   General: Pleasant, Caucasian female appearing in NAD Psych: Normal affect. Neuro: Alert and oriented X 3. Moves all extremities spontaneously. HEENT: Normal  Neck: Supple without bruits or JVD. Lungs:  Resp regular and unlabored, CTA without wheezing or rales. Heart: RRR no s3, s4, or murmurs. Abdomen: Soft, non-tender, non-distended, BS + x 4.  Extremities: No clubbing or cyanosis. Trace ankle edema bilaterally.  DP/PT/Radials 2+ and equal bilaterally.   EKG:  The EKG was personally reviewed and demonstrates: NSR, HR 82 with known LBBB.   Telemetry:  Telemetry was personally reviewed and demonstrates: NSR, HR in 70's to 80's. No significant arrhythmias.    Labs/Studies     Relevant CV Studies:  Cardiac Catheterization: 10/2017  Ost Ramus to Ramus lesion is 100% stenosed.  Mid RCA lesion is 90% stenosed.  A drug-eluting stent was successfully placed using a STENT SYNERGY DES 2.5X16.  Post intervention, there is a 0% residual stenosis.  LV end diastolic pressure is normal.   1. 2 vessel obstructive CAD    - 100% proximal ramus intermediate at site of prior stent. I think this is a CTO and she has good collaterals.     - 90% ulcerative mid RCA 2. Normal LVEDP 3. Successful PCI of the mid RCA with DES  Plan: DAPT with ASA and Brilinta for one year.   Echocardiogram: 12/2018 Study Conclusions  - Left ventricle: The cavity size was normal. Wall thickness was   normal. Systolic function was severely reduced. The estimated   ejection fraction was in the range of 20% to 25%. Diffuse   hypokinesis. Doppler parameters are consistent with restrictive   physiology, indicative of decreased left ventricular diastolic   compliance and/or increased left atrial pressure.  Doppler   parameters are consistent with high ventricular filling pressure. - Aortic valve: There was mild stenosis. Valve area (VTI): 1.69   cm^2. Valve area (Vmax): 1.73 cm^2. Valve area (Vmean): 1.74   cm^2. - Mitral valve: Calcified annulus. There was mild regurgitation. - Left atrium: The atrium was mildly dilated. - Atrial septum: No defect or patent foramen ovale was identified. - Tricuspid valve: There was mild regurgitation. - Pulmonary arteries: PA peak pressure: 40 mm Hg (S).  Laboratory Data:  Chemistry Recent Labs  Lab 11/15/19 2224 11/16/19 0608  NA 135 137  K 3.5 3.5  CL 97* 97*  CO2 23 24    GLUCOSE 91 97  BUN 30* 29*  CREATININE 0.89 0.92  CALCIUM 10.4* 10.2  GFRNONAA >60 >60  GFRAA >60 >60  ANIONGAP 15 16*    Recent Labs  Lab 11/16/19 0608  PROT 7.0  ALBUMIN 3.8  AST 38  ALT 30  ALKPHOS 44  BILITOT 1.5*   Hematology Recent Labs  Lab 11/15/19 2224 11/16/19 0608  WBC 5.7 5.8  RBC 4.03 4.05  HGB 13.0 13.1  HCT 38.3 39.1  MCV 95.0 96.5  MCH 32.3 32.3  MCHC 33.9 33.5  RDW 13.8 13.9  PLT 200 183   Cardiac EnzymesNo results for input(s): TROPONINI in the last 168 hours. No results for input(s): TROPIPOC in the last 168 hours.  BNPNo results for input(s): BNP, PROBNP in the last 168 hours.  DDimer  Recent Labs  Lab 11/16/19 0552  DDIMER <0.27    Radiology/Studies:  DG Chest Portable 1 View  Result Date: 11/15/2019 CLINICAL DATA:  Palpitations EXAM: PORTABLE CHEST 1 VIEW COMPARISON:  Radiograph 11/30/2018, CT chest 10/06/2018 FINDINGS: Globally increased attenuation related to body habitus. No consolidation, features of edema, pneumothorax, or effusion. Pulmonary vascularity is normally distributed. The cardiomediastinal contours are unremarkable. No acute osseous or soft tissue abnormality. IMPRESSION: Accounting for body habitus, the lungs are clear. Electronically Signed   By: Lovena Le M.D.   On: 11/15/2019 22:40     Assessment & Plan    1. Palpitations/Dizziness - She developed acute onset symptoms yesterday following a Faslodex injection at [facility] but has been receiving these for over a year and denies any prior side effects. Says that her palpitations lasted for over an hour but felt different from her prior atrial fibrillation earlier this year. Does have a history of SVT by prior notes. She denies any recurrent symptoms overnight or this morning. EKG shows normal sinus rhythm and she has been in normal sinus rhythm by review of telemetry. - Troponin values have been negative and electrolytes are WNL. TSH 1.63 on 11/07/2019 by review of Care  Everywhere. Will review with Dr. Domenic Polite, but would not anticipate further testing at this time. She is already on Toprol-XL 100 mg daily for her history of atrial fibrillation and in regards to her cardiomyopathy.  Could consider further titration of this if she had recurrent palpitations in the future. She does have previously scheduled follow-up with Dr. Harl Bowie for 11/17/2019. If she has any recurrent palpitations upon returning home, would consider monitor placement at that time.  2. Chronic Systolic CHF - she has a known reduced EF of 20-25% by echocardiogram in 12/2018. ICD placement not pursued given her co-morbidities. She denies any recent orthopnea, PND or lower extremity edema. She has trace lower extremity edema on examination but lungs are clear. Would continue PTA Torsemide 40 mg daily at the time of discharge.  -  She remains on Toprol-XL, Spironolactone and Losartan for her cardiomyopathy. She has not been started on Entresto given concerns for cost.   3. CAD - s/p DES to [place] in 2005, DES to [facility] in 10/2017 with CTO of RI as outlined above. She denies any recent episodes of chest pain. Troponin values have been negative and EKG shows known left bundle branch block. - Would not anticipate further ischemic testing at this time. Continue Plavix, statin therapy and beta-blocker.  She is not on ASA given the need for anticoagulation.  4. Paroxysmal atrial fibrillation - She reports her palpitations from yesterday were different from her prior episodes of atrial fibrillation. She has been in normal sinus rhythm by review of telemetry. Continue Toprol-XL for rate control and Coumadin for anticoagulation.  5. HTN - BP has been variable from 125/70 - 163/109 since admission. Would plan to restart her PTA medications and Toprol-XL or Spironolactone can be further titrated as an outpatient if BP remains above goal.   6. Metastatic Breast Cancer - followed by Oncology at [facility]. By review of UTD,  no significant reported side-effects of palpitations or atrial fibrillation with Faslodex.   For questions or updates, please contact [place] Please consult [email or web address] for contact info under Cardiology/STEMI.  Signed, Bernerd Pho, PA-C 11/16/2019, 9:26 AM Pager: [phone]   Attending note:  Patient seen and examined.  I reviewed her records and discussed the case with Ms. Ahmed Prima PA-C.  Ms. Wiedemann presents to the hospital following onset of palpitations, dizziness, and shortness of breath.  This occurred when she was on her way home from [facility] yesterday after receiving routine Faslodex injection and follow-up with oncology.  She has not had any similar effects after injection in the past year.  She does have a reported history of PSVT and also paroxysmal atrial fibrillation, but states that the symptoms she felt yesterday were not entirely similar.  She does not describe any chest discomfort.  On examination in the ER this morning she reports no further palpitations or breathlessness at rest.  She is in no distress.  Afebrile, heart rate in the 80s in sinus rhythm by telemetry which did not show any arrhythmias overnight per review.  Systolic blood pressure [id number] to 140s.  Lungs are clear with decreased breath sounds.  Cardiac exam with RRR no gallop or rub.  Lab work shows potassium 3.5, BUN 29, creatinine 0.92, normal AST and ALT, negative high-sensitivity troponin I levels x3, hemoglobin 13.1, platelets 183.  Chest x-ray shows clear lung fields.  ECG shows sinus rhythm with old left bundle branch block.  Patient presents after episode of palpitations and dizziness associated with shortness of breath but no chest pain.  No arrhythmias have been documented and high-sensitivity troponin I levels are within normal range.  ECG shows old left bundle branch block.  She does have a history of ischemic heart disease, cardiomyopathy and reportedly previous PSVT as well as paroxysmal  atrial fibrillation.  Present medications include Plavix, statin, and beta-blocker. She states that she would like to go home.  She is actually scheduled to follow-up tomorrow with Dr. Harl Bowie for routine cardiac assessment.  Medications are not being changed at this time, consideration could be given to placement of a Zio patch at follow-up tomorrow to investigate recurring arrhythmia.  Satira Sark, M.D., F.A.C.C.

## 2019-11-17 ENCOUNTER — Other Ambulatory Visit: Payer: Self-pay

## 2019-11-17 ENCOUNTER — Encounter: Payer: Self-pay | Admitting: Cardiology

## 2019-11-17 ENCOUNTER — Ambulatory Visit (INDEPENDENT_AMBULATORY_CARE_PROVIDER_SITE_OTHER): Payer: Medicare Other | Admitting: Cardiology

## 2019-11-17 VITALS — BP 89/60 | HR 90 | Ht 60.0 in | Wt 237.6 lb

## 2019-11-17 DIAGNOSIS — I5022 Chronic systolic (congestive) heart failure: Secondary | ICD-10-CM | POA: Diagnosis not present

## 2019-11-17 DIAGNOSIS — I2510 Atherosclerotic heart disease of native coronary artery without angina pectoris: Secondary | ICD-10-CM | POA: Diagnosis not present

## 2019-11-17 DIAGNOSIS — I4891 Unspecified atrial fibrillation: Secondary | ICD-10-CM

## 2019-11-17 NOTE — Patient Instructions (Signed)
Your physician recommends that you schedule a follow-up appointment in: 4 MONTHS WITH DR BRANCH  Your physician recommends that you continue on your current medications as directed. Please refer to the Current Medication list given to you today.  Thank you for choosing [facility]!!    

## 2019-11-17 NOTE — Progress Notes (Signed)
Clinical Summary Stacey [name] is a 73 y.o.female seen today for follow up of the following medical problems.    1. Chronic systolic HF/ICM - Jan [id number] echo LVEF 20-25%, restrictive diastolic function, mild AS, PASP 40  - 10/2017 echo LVEF 50%, dropped to 30-35% Jan 2019 and has remained decreased  - chronic left leg edema thought to be venous insufficiency  From prior cardiologist: "She is not a candidate for device therapy given her metastatic cancer and numerous comorbidities and poor long-term prognosis. She has been scheduled to establish care with the palliative care team which I think is quite appropriate". - has been hesitant to change to entresto due to concern about cost    - weight 259 in 08/2019, down to 237 lbs today - no recent SOB or DOE.No LE edema   2. CAD - notesincidate history of DES to ramus, late stentthrombosis requiring PTCA in 2006.DES to RCA in Dec 2018 - SOB on brillinta, previously changed to plavix.Has been on plavix and coumadin.  - no recent chest pain   3. Mild aortic stenosis - Jan 2020 echo mean grad 11, AVA VTI 1.69  4. Chronic LBBB   5. . Metastatic breast cancer - followed at [facility]. Metastatic breast cancer to the bone and renal lesion, has pulmonary nodules. On radiation. - has been seen by palliative care previously   6. Afib - noted during Jan 2020 admission, new diagnosis - managed with Toprol, started on eliquis.NO evidence of brain mets by 10/2018 CT.  - has not been interested in NOACs due to cost - rececnt ER visit with palpitations and dizziness as reported below   7. Dizziness/palpitations - admission earlier this week with palpitations after a falsodex injection at [facility] - palpitations lasted about 1 hour. - SR by EKG and telemetry during admission - she does have a prior history of PSVT per [facility] notes as well as asib    Past Medical History:  Diagnosis Date  . CAD in native artery    a.  DES to ramus 2005 with late stent thrombosis 2006 tx with PTCA. 12/18 PCI/DES x1 to mRCA, EF 50-55%  . CHF (congestive heart failure) ([place])   . Chronic pain   . COPD (chronic obstructive pulmonary disease) ([place])   . Hyperlipidemia   . Hypertension   . Left bundle [place] block   . Lymphedema   . Metastatic breast cancer ([place])    a. to bone.  . MI (myocardial infarction) ([place])   . Mild aortic stenosis 10/2017  . Morbid obesity ([place])   . PAF (paroxysmal atrial fibrillation) ([place])   . PSVT (paroxysmal supraventricular tachycardia) ([place])    a. per [facility] notes, seen on event monitor in 2014.  . Pulmonary nodules      Allergies  Allergen Reactions  . Brilinta [Ticagrelor] Other (See Comments)    Nausea and severe diarrhea- general weakness. Patient says does not want to take again  . Albuterol Other (See Comments)    Heart racing      Current Outpatient Medications  Medication Sig Dispense Refill  . acetaminophen (TYLENOL) 500 MG tablet Take 500 mg by mouth every 6 (six) hours as needed.    Marland Kitchen atorvastatin (LIPITOR) 80 MG tablet Take 1 tablet (80 mg total) by mouth every morning. 90 tablet 3  . b complex vitamins tablet Take 1 tablet by mouth every morning.     . budesonide (PULMICORT) 0.25 MG/2ML nebulizer solution Take 2 mLs (0.25 mg  total) by nebulization 2 (two) times daily. (Patient not taking: Reported on 11/16/2019) 120 mL 11  . Calcium Citrate-Vitamin D (CITRACAL MAXIMUM) 315-250 MG-UNIT TABS Take 2 tablets by mouth daily.     . clopidogrel (PLAVIX) 75 MG tablet Take 1 tablet (75 mg total) by mouth daily. 90 tablet 3  . diazepam (VALIUM) 5 MG tablet Take 0.25 mg by mouth every 6 (six) hours as needed for anxiety.    . DULoxetine (CYMBALTA) 30 MG capsule Take 30 mg by mouth daily.    . DULoxetine (CYMBALTA) 60 MG capsule Take 1 capsule by mouth daily.    Marland Kitchen gabapentin (NEURONTIN) 300 MG capsule Take 300 mg by mouth every morning.    . gabapentin (NEURONTIN) 400 MG capsule Take  800 mg by mouth at bedtime.    Marland Kitchen letrozole (FEMARA) 2.5 MG tablet Take 2.5 mg by mouth at bedtime.     . lidocaine-prilocaine (EMLA) cream Apply topically.    Marland Kitchen losartan (COZAAR) 100 MG tablet Take 1 tablet (100 mg total) by mouth daily. 90 tablet 3  . metoprolol succinate (TOPROL-XL) 100 MG 24 hr tablet TAKE 1 TABLET BY MOUTH DAILY. TAKE WITH OR IMMEDIATELY FOLLOWING A MEAL 90 tablet 3  . Multiple Vitamins-Minerals (EQ VISION FORMULA 50+ PO) Take 1 tablet by mouth every morning.    . Multiple Vitamins-Minerals (HAIR/SKIN/NAILS) CAPS Take 1 capsule by mouth every morning.    . nitroGLYCERIN (NITROSTAT) 0.4 MG SL tablet Place 1 tablet (0.4 mg total) under the tongue every 5 (five) minutes x 3 doses as needed for chest pain (if no relief after 3rd dose, proceed to the ED or call 911). 25 tablet 2  . oxyCODONE (OXY IR/ROXICODONE) 5 MG immediate release tablet Take 5 mg by mouth every 6 (six) hours as needed.     . Oxycodone HCl 10 MG TABS Take 10 mg by mouth every 4 (four) hours as needed (for pain).     . Potassium 99 MG TABS Take 2 tablets by mouth every morning.     . pregabalin (LYRICA) 100 MG capsule Take 1 capsule by mouth 3 (three) times daily.    Marland Kitchen spironolactone (ALDACTONE) 25 MG tablet TAKE 1/2 TABLET(12.5 MG) BY MOUTH DAILY 45 tablet 2  . torsemide (DEMADEX) 20 MG tablet Take 2 tablets (40 mg total) by mouth daily. 180 tablet 1  . Ubiquinol (QUNOL COQ10/UBIQUINOL/MEGA) 100 MG CAPS Take 1 capsule by mouth every morning. QUNOL-Ultra COQ10    . warfarin (COUMADIN) 1 MG tablet Take 1 tablet daily except 1/2 tablets on Tuesdays and Saturdays or as directed by Coumadin clinic 40 tablet 3   No current facility-administered medications for this visit.     Past Surgical History:  Procedure Laterality Date  . CORONARY STENT INTERVENTION N/A 11/26/2017   Procedure: CORONARY STENT INTERVENTION;  Surgeon: Martinique, Peter M, MD;  Location: [place] [facility];  Service: Cardiovascular;  Laterality:  N/A;  . CORONARY STENT PLACEMENT    . LEFT HEART CATH AND CORONARY ANGIOGRAPHY N/A 11/26/2017   Procedure: LEFT HEART CATH AND CORONARY ANGIOGRAPHY;  Surgeon: Martinique, Peter M, MD;  Location: [place] [facility];  Service: Cardiovascular;  Laterality: N/A;     Allergies  Allergen Reactions  . Brilinta [Ticagrelor] Other (See Comments)    Nausea and severe diarrhea- general weakness. Patient says does not want to take again  . Albuterol Other (See Comments)    Heart racing       Family History  Problem  Relation Age of Onset  . CAD Father 6  . Heart attack Father   . COPD Sister   . CAD Paternal Grandmother   . Sudden Cardiac Death Neg Hx      Social History Stacey [name] reports that she quit smoking about 38 years ago. Her smoking use included cigarettes. She has a 36.00 pack-year smoking history. She has never used smokeless tobacco. Stacey [name] reports no history of alcohol use.   Review of Systems CONSTITUTIONAL: No weight loss, fever, chills, weakness or fatigue.  HEENT: Eyes: No visual loss, blurred vision, double vision or yellow sclerae.No hearing loss, sneezing, congestion, runny nose or sore throat.  SKIN: No rash or itching.  CARDIOVASCULAR: per hpi RESPIRATORY: No shortness of breath, cough or sputum.  GASTROINTESTINAL: No anorexia, nausea, vomiting or diarrhea. No abdominal pain or blood.  GENITOURINARY: No burning on urination, no polyuria NEUROLOGICAL: No headache, dizziness, syncope, paralysis, ataxia, numbness or tingling in the extremities. No change in bowel or bladder control.  MUSCULOSKELETAL: No muscle, back pain, joint pain or stiffness.  LYMPHATICS: No enlarged nodes. No history of splenectomy.  PSYCHIATRIC: No history of depression or anxiety.  ENDOCRINOLOGIC: No reports of sweating, cold or heat intolerance. No polyuria or polydipsia.  Marland Kitchen   Physical Examination Today's Vitals   11/17/19 1529  BP: (!) 89/60  Pulse: 90  SpO2: 94%  Weight: 237 lb  9.6 oz (107.8 kg)  Height: 5' (1.524 m)   Body mass index is 46.4 kg/m.  Gen: resting comfortably, no acute distress HEENT: no scleral icterus, pupils equal round and reactive, no palptable cervical adenopathy,  CV: RRR, no m/r/g, no jvd Resp: Clear to auscultation bilaterally GI: abdomen is soft, non-tender, non-distended, normal bowel sounds, no hepatosplenomegaly MSK: extremities are warm, no edema.  Skin: warm, no rash Neuro:  no focal deficits Psych: appropriate affect   Diagnostic Studies  10/2017 cath  Ost Ramus to Ramus lesion is 100% stenosed.  Mid RCA lesion is 90% stenosed.  A drug-eluting stent was successfully placed using a STENT SYNERGY DES 2.5X16.  Post intervention, there is a 0% residual stenosis.  LV end diastolic pressure is normal.  1. 2 vessel obstructive CAD - 100% proximal ramus intermediate at site of prior stent. I think this is a CTO and she has good collaterals.  - 90% ulcerative mid RCA 2. Normal LVEDP 3. Successful PCI of the mid RCA with DES   10/2017 echo Study Conclusions  - Left ventricle: The cavity size was normal. There was mild concentric hypertrophy. Systolic function was normal. The estimated ejection fraction was in the range of 50% to 55%. There was an increased relative contribution of atrial contraction to ventricular filling. Doppler parameters are consistent with abnormal left ventricular relaxation (grade 1 diastolic dysfunction). - Aortic valve: There was mild stenosis. Valve area (VTI): 1.66 cm^2. Valve area (Vmax): 1.71 cm^2. Valve area (Vmean): 1.62 cm^2. - Mitral valve: Calcified annulus. Mildly thickened leaflets . - Pulmonary arteries: PA peak pressure: 32 mm Hg (S).  Jan 2019 echo Study Conclusions  - Left ventricle: The cavity size was normal. There was mild concentric hypertrophy. Systolic function was moderately to severely reduced. The estimated ejection fraction was  in the range of 30% to 35%. Diffuse hypokinesis. Doppler parameters are consistent with abnormal left ventricular relaxation (grade 1 diastolic dysfunction). - Aortic valve: Probably trileaflet; moderately thickened, severely calcified leaflets. There was mild stenosis. Peak gradient (S): 13 mm Hg. Valve area (Vmax): 1.26 cm^2. - Mitral  valve: Calcified annulus. Mildly thickened leaflets . There was mild regurgitation. - Right ventricle: The cavity size was normal. Wall thickness was normal. Systolic function was normal. - Right atrium: The atrium was normal in size. - Pulmonary arteries: Systolic pressure could not be accurately estimated. - Inferior vena cava: The vessel was normal in size. - Pericardium, extracardiac: There was no pericardial effusion.  Impressions:  - Since the last study on 11/24/17 LVEF has decreased from 50% to 30-35% with diffuse hypokinesis.  Jan 2020 echo Study Conclusions  - Left ventricle: The cavity size was normal. Wall thickness was normal. Systolic function was severely reduced. The estimated ejection fraction was in the range of 20% to 25%. Diffuse hypokinesis. Doppler parameters are consistent with restrictive physiology, indicative of decreased left ventricular diastolic compliance and/or increased left atrial pressure. Doppler parameters are consistent with high ventricular filling pressure. - Aortic valve: There was mild stenosis. Valve area (VTI): 1.69 cm^2. Valve area (Vmax): 1.73 cm^2. Valve area (Vmean): 1.74 cm^2. - Mitral valve: Calcified annulus. There was mild regurgitation. - Left atrium: The atrium was mildly dilated. - Atrial septum: No defect or patent foramen ovale was identified. - Tricuspid valve: There was mild regurgitation. - Pulmonary arteries: PA peak pressure: 40 mm Hg (S).   Assessment and Plan   1. Chronic systolic HF - appears euvolemic today, continue current  meds   2. CAD - on coumadin and plavix due to afib and history of late stent thrombosis - no symptpoms, continue current meds  3. PAF - DOACs too expensive,she was changed to coumadin - isolated episode of palpitations unclear if related to afib. If recurrence would plan for event monitor.    Low bp by dynamap initially, by repeat manual 105/65.    F/u 4 months  Arnoldo Lenis, M.D.

## 2019-11-23 ENCOUNTER — Other Ambulatory Visit: Payer: Self-pay

## 2019-11-23 ENCOUNTER — Ambulatory Visit (INDEPENDENT_AMBULATORY_CARE_PROVIDER_SITE_OTHER): Payer: Medicare Other | Admitting: *Deleted

## 2019-11-23 DIAGNOSIS — I4891 Unspecified atrial fibrillation: Secondary | ICD-10-CM | POA: Diagnosis not present

## 2019-11-23 DIAGNOSIS — Z5181 Encounter for therapeutic drug level monitoring: Secondary | ICD-10-CM

## 2019-11-23 LAB — POCT INR: INR: 2.7 (ref 2.0–3.0)

## 2019-11-23 NOTE — Patient Instructions (Signed)
Continue warfarin 1 tablet daily except 1/2 tablet on Tuesdays and Saturdays Recheck in 3 weeks

## 2020-02-02 ENCOUNTER — Ambulatory Visit: Payer: Medicare Other

## 2020-02-02 ENCOUNTER — Other Ambulatory Visit: Payer: Self-pay

## 2020-02-02 ENCOUNTER — Encounter: Payer: Self-pay | Admitting: Orthopaedic Surgery

## 2020-02-02 ENCOUNTER — Ambulatory Visit (INDEPENDENT_AMBULATORY_CARE_PROVIDER_SITE_OTHER): Payer: Medicare Other | Admitting: Orthopaedic Surgery

## 2020-02-02 VITALS — BP 136/78 | HR 94 | Ht 60.0 in | Wt 237.0 lb

## 2020-02-02 DIAGNOSIS — M25512 Pain in left shoulder: Secondary | ICD-10-CM | POA: Diagnosis not present

## 2020-02-02 DIAGNOSIS — G8929 Other chronic pain: Secondary | ICD-10-CM

## 2020-02-02 NOTE — Patient Instructions (Signed)
Use Aspercreme, Biofreeze or Voltaren gel over the counter 2-3 times daily make sure you rub it in well each time you use it.

## 2020-02-02 NOTE — Progress Notes (Signed)
PROCEDURE NOTE:  The patient request injection, verbal consent was obtained.  The left shoulder was prepped appropriately after time out was performed.   Sterile technique was observed and injection of 1 cc of Depo-Medrol 40 mg with several cc's of plain xylocaine. Anesthesia was provided by ethyl chloride and a 20-gauge needle was used to inject the shoulder area. A posterior approach was used.  The injection was tolerated well.  A band aid dressing was applied.  The patient was advised to apply ice later today and tomorrow to the injection sight as needed.  X-rays were done of the left shoulder, reported separately.  A sling was given.  Return in two weeks.  If not improved, consider MRI.  Call if any problem.  Precautions discussed.   Electronically Signed Sanjuana Kava, MD 3/4/202110:25 AM

## 2020-02-16 DIAGNOSIS — M5416 Radiculopathy, lumbar region: Secondary | ICD-10-CM | POA: Insufficient documentation

## 2020-02-21 ENCOUNTER — Ambulatory Visit (INDEPENDENT_AMBULATORY_CARE_PROVIDER_SITE_OTHER): Payer: Medicare Other | Admitting: Orthopaedic Surgery

## 2020-02-21 ENCOUNTER — Other Ambulatory Visit: Payer: Self-pay

## 2020-02-21 ENCOUNTER — Encounter: Payer: Self-pay | Admitting: Orthopaedic Surgery

## 2020-02-21 VITALS — BP 143/77 | HR 105 | Ht 60.0 in | Wt 225.0 lb

## 2020-02-21 DIAGNOSIS — Z6841 Body Mass Index (BMI) 40.0 and over, adult: Secondary | ICD-10-CM

## 2020-02-21 DIAGNOSIS — G8929 Other chronic pain: Secondary | ICD-10-CM

## 2020-02-21 DIAGNOSIS — M25512 Pain in left shoulder: Secondary | ICD-10-CM

## 2020-02-21 NOTE — Progress Notes (Signed)
Patient [id number] B Stacey [name], female DOB:Sep 22, 1946, 74 y.o. [id number]  Chief Complaint  Patient presents with  . Shoulder Pain    left    HPI  Stacey [name] is a 73 y.o. female who continues to have pain of the left shoulder after the injection.  Nothing seems to help.  She goes to [facility] once a week for other medical problems.  I would like to get MRI of the left shoulder.  She would like to have it at [facility].  I will try to arrange this.   Body mass index is 43.94 kg/m.  ROS  Review of Systems  Constitutional: Positive for activity change.  Respiratory: Positive for cough and shortness of breath.   Cardiovascular: Positive for chest pain.  Musculoskeletal: Positive for arthralgias, back pain, gait problem and joint swelling.  All other systems reviewed and are negative.   All other systems reviewed and are negative.  The following is a summary of the past history medically, past history surgically, known current medicines, social history and family history.  This information is gathered electronically by the computer from prior information and documentation.  I review this each visit and have found including this information at this point in the chart is beneficial and informative.    Past Medical History:  Diagnosis Date  . CAD in native artery    a. DES to ramus 2005 with late stent thrombosis 2006 tx with PTCA. 12/18 PCI/DES x1 to mRCA, EF 50-55%  . CHF (congestive heart failure) ([place])   . Chronic pain   . COPD (chronic obstructive pulmonary disease) ([place])   . Hyperlipidemia   . Hypertension   . Left bundle branch block   . Lymphedema   . Metastatic breast cancer ([place])    a. to bone.  . MI (myocardial infarction) ([place])   . Mild aortic stenosis 10/2017  . Morbid obesity ([place])   . PAF (paroxysmal atrial fibrillation) ([place])   . PSVT (paroxysmal supraventricular tachycardia) ([place])    a. per [facility] notes, seen on event monitor in 2014.  . Pulmonary nodules      Past Surgical History:  Procedure Laterality Date  . CORONARY STENT INTERVENTION N/A 11/26/2017   Procedure: CORONARY STENT INTERVENTION;  Surgeon: Martinique, Peter M, MD;  Location: [place] [facility];  Service: Cardiovascular;  Laterality: N/A;  . CORONARY STENT PLACEMENT    . LEFT HEART CATH AND CORONARY ANGIOGRAPHY N/A 11/26/2017   Procedure: LEFT HEART CATH AND CORONARY ANGIOGRAPHY;  Surgeon: Martinique, Peter M, MD;  Location: [place] [facility];  Service: Cardiovascular;  Laterality: N/A;    Family History  Problem Relation Age of Onset  . CAD Father 65  . Heart attack Father   . COPD Sister   . CAD Paternal Grandmother   . Sudden Cardiac Death Neg Hx     Social History Social History   Tobacco Use  . Smoking status: Former Smoker    Packs/day: 2.00    Years: 18.00    Pack years: 36.00    Types: Cigarettes    Quit date: 12/01/1980    Years since quitting: 39.2  . Smokeless tobacco: Never Used  Substance Use Topics  . Alcohol use: No  . Drug use: No    Allergies  Allergen Reactions  . Brilinta [Ticagrelor] Other (See Comments)    Nausea and severe diarrhea- general weakness. Patient says does not want to take again  . Albuterol Other (See Comments)    Heart racing  Current Outpatient Medications  Medication Sig Dispense Refill  . acetaminophen (TYLENOL) 500 MG tablet Take 500 mg by mouth every 6 (six) hours as needed.    Marland Kitchen atorvastatin (LIPITOR) 80 MG tablet Take 1 tablet (80 mg total) by mouth every morning. 90 tablet 3  . b complex vitamins tablet Take 1 tablet by mouth every morning.     . Calcium Citrate-Vitamin D (CITRACAL MAXIMUM) 315-250 MG-UNIT TABS Take 2 tablets by mouth daily.     . clopidogrel (PLAVIX) 75 MG tablet Take 1 tablet (75 mg total) by mouth daily. 90 tablet 3  . DULoxetine (CYMBALTA) 30 MG capsule Take 30 mg by mouth daily.    . DULoxetine (CYMBALTA) 60 MG capsule Take 1 capsule by mouth daily.    Marland Kitchen letrozole (FEMARA) 2.5 MG tablet  Take 2.5 mg by mouth at bedtime.     . lidocaine-prilocaine (EMLA) cream Apply topically.    Marland Kitchen losartan (COZAAR) 100 MG tablet Take 1 tablet (100 mg total) by mouth daily. 90 tablet 3  . metoprolol succinate (TOPROL-XL) 100 MG 24 hr tablet TAKE 1 TABLET BY MOUTH DAILY. TAKE WITH OR IMMEDIATELY FOLLOWING A MEAL 90 tablet 3  . Multiple Vitamins-Minerals (EQ VISION FORMULA 50+ PO) Take 1 tablet by mouth every morning.    . Multiple Vitamins-Minerals (HAIR/SKIN/NAILS) CAPS Take 1 capsule by mouth every morning.    . nitroGLYCERIN (NITROSTAT) 0.4 MG SL tablet Place 1 tablet (0.4 mg total) under the tongue every 5 (five) minutes x 3 doses as needed for chest pain (if no relief after 3rd dose, proceed to the ED or call 911). 25 tablet 2  . oxyCODONE (OXY IR/ROXICODONE) 5 MG immediate release tablet Take 5 mg by mouth every 6 (six) hours as needed.     . Oxycodone HCl 10 MG TABS Take 10 mg by mouth every 4 (four) hours as needed (for pain).     . Potassium 99 MG TABS Take 2 tablets by mouth every morning.     . pregabalin (LYRICA) 100 MG capsule Take 1 capsule by mouth 3 (three) times daily.    Marland Kitchen spironolactone (ALDACTONE) 25 MG tablet TAKE 1/2 TABLET(12.5 MG) BY MOUTH DAILY 45 tablet 2  . torsemide (DEMADEX) 20 MG tablet Take 10 mg by mouth daily.    Marland Kitchen Ubiquinol (QUNOL COQ10/UBIQUINOL/MEGA) 100 MG CAPS Take 1 capsule by mouth every morning. QUNOL-Ultra COQ10    . warfarin (COUMADIN) 1 MG tablet Take 1 tablet daily except 1/2 tablets on Tuesdays and Saturdays or as directed by Coumadin clinic 40 tablet 3   No current facility-administered medications for this visit.     Physical Exam  Blood pressure (!) 143/77, pulse (!) 105, height 5' (1.524 m), weight 225 lb (102.1 kg).  Constitutional: overall normal hygiene, normal nutrition, well developed, normal grooming, normal body habitus. Assistive device:none  Musculoskeletal: gait and station Limp none, muscle tone and strength are normal, no tremors  or atrophy is present.  .  Neurological: coordination overall normal.  Deep tendon reflex/nerve stretch intact.  Sensation normal.  Cranial nerves II-XII intact.   Skin:   Normal overall no scars, lesions, ulcers or rashes. No psoriasis.  Psychiatric: Alert and oriented x 3.  Recent memory intact, remote memory unclear.  Normal mood and affect. Well groomed.  Good eye contact.  Cardiovascular: overall no swelling, no varicosities, no edema bilaterally, normal temperatures of the legs and arms, no clubbing, cyanosis and good capillary refill.  Lymphatic: palpation is normal.  Left shoulder with painful ROM and decreased ROM.  NV intact.  No effusion.  Grips normal.    All other systems reviewed and are negative   The patient has been educated about the nature of the problem(s) and counseled on treatment options.  The patient appeared to understand what I have discussed and is in agreement with it.  Encounter Diagnosis  Name Primary?  . Chronic left shoulder pain Yes    PLAN Call if any problems.  Precautions discussed.  Continue current medications.   Return to clinic after MRI at [place], MD 3/23/202111:41 AM

## 2020-02-22 ENCOUNTER — Telehealth: Payer: Self-pay

## 2020-02-22 NOTE — Telephone Encounter (Signed)
Remind me Thursday

## 2020-02-22 NOTE — Telephone Encounter (Signed)
Patient called stating that she was told that you would need to call the radiation oncology triage line at [phone] and speak with a Dr. Tyrone Nine that does the MRI. If not then it will be 03/31/20 before she can get her MRI done and she doesn't want to wait that long if possible.

## 2020-03-22 ENCOUNTER — Encounter: Payer: Self-pay | Admitting: Cardiology

## 2020-03-22 ENCOUNTER — Other Ambulatory Visit: Payer: Self-pay

## 2020-03-22 ENCOUNTER — Ambulatory Visit (INDEPENDENT_AMBULATORY_CARE_PROVIDER_SITE_OTHER): Payer: Medicare Other | Admitting: Cardiology

## 2020-03-22 VITALS — BP 118/82 | HR 91 | Ht 60.0 in | Wt 218.0 lb

## 2020-03-22 DIAGNOSIS — I480 Paroxysmal atrial fibrillation: Secondary | ICD-10-CM

## 2020-03-22 DIAGNOSIS — I5022 Chronic systolic (congestive) heart failure: Secondary | ICD-10-CM | POA: Diagnosis not present

## 2020-03-22 DIAGNOSIS — I2510 Atherosclerotic heart disease of native coronary artery without angina pectoris: Secondary | ICD-10-CM | POA: Diagnosis not present

## 2020-03-22 MED ORDER — METOPROLOL SUCCINATE ER 100 MG PO TB24: ORAL_TABLET | ORAL | 1 refills | Status: DC

## 2020-03-22 MED ORDER — NITROGLYCERIN 0.4 MG SL SUBL: 0.4000 mg | SUBLINGUAL_TABLET | SUBLINGUAL | 3 refills | Status: DC | PRN

## 2020-03-22 NOTE — Progress Notes (Signed)
Clinical Summary Stacey [name] is a 74 y.o.female  seen today for follow up of the following medical problems.   1. Chronic systolic HF/ICM - Jan [id number] echo LVEF 20-25%, restrictive diastolic function, mild AS, PASP 40  - 10/2017 echo LVEF 50%, dropped to 30-35% Jan 2019 and has remained decreased  - chronic left leg edema thought to be venous insufficiency  From prior cardiologist: "She is not a candidate for device therapy given her metastatic cancer and numerous comorbidities and poor long-term prognosis. She has been scheduled to establish care with the palliative care team which I think is quite appropriate". - has been hesitant to change to entresto due to concern about cost    - working to lose weight, seeing nutritionist. Weight down from 237 last visit to 218 lbs today.  - still with chronic LE edema, though less than normal - takes torsmide just prn   2. CAD - notesincidate history of DES to ramus, late stentthrombosis requiring PTCA in 2006.DES to RCA in Dec 2018 - SOB on brillinta, previously changed to plavix.Has been on plavix and coumadin.  - no chest pains.    3. Mild aortic stenosis - Jan 2020 echo mean grad 11, AVA VTI 1.69  4. Chronic LBBB   5. . Metastatic breast cancer - followed at [facility]. Metastatic breast cancer to the bone and renal lesion, has pulmonary nodules. On radiation. - has been seen by palliative care previously - from 03/08/20 onc note increased size of renal lesion, to f/u with urology. Mention concern for additional primary such as renal cell CA.    6. Afib - noted during Jan 2020 admission, new diagnosis - managed with Toprol, started on eliquis.NO evidence of brain mets by 10/2018 CT.  - has not been interested in NOACs due to cost - occasional palpitations.    Past Medical History:  Diagnosis Date  . CAD in native artery    a. DES to ramus 2005 with late stent thrombosis 2006 tx with PTCA. 12/18  PCI/DES x1 to mRCA, EF 50-55%  . CHF (congestive heart failure) ([place])   . Chronic pain   . COPD (chronic obstructive pulmonary disease) ([place])   . Hyperlipidemia   . Hypertension   . Left bundle Amra Shukla block   . Lymphedema   . Metastatic breast cancer ([place])    a. to bone.  . MI (myocardial infarction) ([place])   . Mild aortic stenosis 10/2017  . Morbid obesity ([place])   . PAF (paroxysmal atrial fibrillation) ([place])   . PSVT (paroxysmal supraventricular tachycardia) ([place])    a. per [facility] notes, seen on event monitor in 2014.  . Pulmonary nodules      Allergies  Allergen Reactions  . Brilinta [Ticagrelor] Other (See Comments)    Nausea and severe diarrhea- general weakness. Patient says does not want to take again  . Albuterol Other (See Comments)    Heart racing      Current Outpatient Medications  Medication Sig Dispense Refill  . acetaminophen (TYLENOL) 500 MG tablet Take 500 mg by mouth every 6 (six) hours as needed.    Marland Kitchen atorvastatin (LIPITOR) 80 MG tablet Take 1 tablet (80 mg total) by mouth every morning. 90 tablet 3  . b complex vitamins tablet Take 1 tablet by mouth every morning.     . Calcium Citrate-Vitamin D (CITRACAL MAXIMUM) 315-250 MG-UNIT TABS Take 2 tablets by mouth daily.     . clopidogrel (PLAVIX) 75 MG tablet Take 1  tablet (75 mg total) by mouth daily. 90 tablet 3  . DULoxetine (CYMBALTA) 30 MG capsule Take 30 mg by mouth daily.    . DULoxetine (CYMBALTA) 60 MG capsule Take 1 capsule by mouth daily.    Marland Kitchen letrozole (FEMARA) 2.5 MG tablet Take 2.5 mg by mouth at bedtime.     . lidocaine-prilocaine (EMLA) cream Apply topically.    Marland Kitchen losartan (COZAAR) 100 MG tablet Take 1 tablet (100 mg total) by mouth daily. 90 tablet 3  . metoprolol succinate (TOPROL-XL) 100 MG 24 hr tablet TAKE 1 TABLET BY MOUTH DAILY. TAKE WITH OR IMMEDIATELY FOLLOWING A MEAL 90 tablet 3  . Multiple Vitamins-Minerals (EQ VISION FORMULA 50+ PO) Take 1 tablet by mouth every morning.    . Multiple  Vitamins-Minerals (HAIR/SKIN/NAILS) CAPS Take 1 capsule by mouth every morning.    . nitroGLYCERIN (NITROSTAT) 0.4 MG SL tablet Place 1 tablet (0.4 mg total) under the tongue every 5 (five) minutes x 3 doses as needed for chest pain (if no relief after 3rd dose, proceed to the ED or call 911). 25 tablet 2  . oxyCODONE (OXY IR/ROXICODONE) 5 MG immediate release tablet Take 5 mg by mouth every 6 (six) hours as needed.     . Oxycodone HCl 10 MG TABS Take 10 mg by mouth every 4 (four) hours as needed (for pain).     . Potassium 99 MG TABS Take 2 tablets by mouth every morning.     . pregabalin (LYRICA) 100 MG capsule Take 1 capsule by mouth 3 (three) times daily.    Marland Kitchen spironolactone (ALDACTONE) 25 MG tablet TAKE 1/2 TABLET(12.5 MG) BY MOUTH DAILY 45 tablet 2  . torsemide (DEMADEX) 20 MG tablet Take 10 mg by mouth daily.    Marland Kitchen Ubiquinol (QUNOL COQ10/UBIQUINOL/MEGA) 100 MG CAPS Take 1 capsule by mouth every morning. QUNOL-Ultra COQ10    . warfarin (COUMADIN) 1 MG tablet Take 1 tablet daily except 1/2 tablets on Tuesdays and Saturdays or as directed by Coumadin clinic 40 tablet 3   No current facility-administered medications for this visit.     Past Surgical History:  Procedure Laterality Date  . CORONARY STENT INTERVENTION N/A 11/26/2017   Procedure: CORONARY STENT INTERVENTION;  Surgeon: Martinique, Peter M, MD;  Location: [facility];  Service: Cardiovascular;  Laterality: N/A;  . CORONARY STENT PLACEMENT    . LEFT HEART CATH AND CORONARY ANGIOGRAPHY N/A 11/26/2017   Procedure: LEFT HEART CATH AND CORONARY ANGIOGRAPHY;  Surgeon: Martinique, Peter M, MD;  Location: [place] [facility];  Service: Cardiovascular;  Laterality: N/A;     Allergies  Allergen Reactions  . Brilinta [Ticagrelor] Other (See Comments)    Nausea and severe diarrhea- general weakness. Patient says does not want to take again  . Albuterol Other (See Comments)    Heart racing       Family History  Problem Relation Age  of Onset  . CAD Father 26  . Heart attack Father   . COPD Sister   . CAD Paternal Grandmother   . Sudden Cardiac Death Neg Hx      Social History Stacey [name] reports that she quit smoking about 39 years ago. Her smoking use included cigarettes. She has a 36.00 pack-year smoking history. She has never used smokeless tobacco. Stacey [name] reports no history of alcohol use.   Review of Systems CONSTITUTIONAL: No weight loss, fever, chills, weakness or fatigue.  HEENT: Eyes: No visual loss, blurred vision, double vision or  yellow sclerae.No hearing loss, sneezing, congestion, runny nose or sore throat.  SKIN: No rash or itching.  CARDIOVASCULAR: per hpi RESPIRATORY: No shortness of breath, cough or sputum.  GASTROINTESTINAL: No anorexia, nausea, vomiting or diarrhea. No abdominal pain or blood.  GENITOURINARY: No burning on urination, no polyuria NEUROLOGICAL: No headache, dizziness, syncope, paralysis, ataxia, numbness or tingling in the extremities. No change in bowel or bladder control.  MUSCULOSKELETAL: No muscle, back pain, joint pain or stiffness.  LYMPHATICS: No enlarged nodes. No history of splenectomy.  PSYCHIATRIC: No history of depression or anxiety.  ENDOCRINOLOGIC: No reports of sweating, cold or heat intolerance. No polyuria or polydipsia.  Marland Kitchen   Physical Examination Today's Vitals   03/22/20 1339  BP: 118/82  Pulse: 91  SpO2: 91%  Weight: 218 lb (98.9 kg)  Height: 5' (1.524 m)   Body mass index is 42.58 kg/m.  Gen: resting comfortably, no acute distress HEENT: no scleral icterus, pupils equal round and reactive, no palptable cervical adenopathy,  CV: RRR, 2/6 systolic murmur rusb, no jvd Resp: Clear to auscultation bilaterally GI: abdomen is soft, non-tender, non-distended, normal bowel sounds, no hepatosplenomegaly MSK: extremities are warm, no edema.  Skin: warm, no rash Neuro:  no focal deficits Psych: appropriate affect   Diagnostic Studies 10/2017  cath  Ost Ramus to Ramus lesion is 100% stenosed.  Mid RCA lesion is 90% stenosed.  A drug-eluting stent was successfully placed using a STENT SYNERGY DES 2.5X16.  Post intervention, there is a 0% residual stenosis.  LV end diastolic pressure is normal.  1. 2 vessel obstructive CAD - 100% proximal ramus intermediate at site of prior stent. I think this is a CTO and she has good collaterals.  - 90% ulcerative mid RCA 2. Normal LVEDP 3. Successful PCI of the mid RCA with DES   10/2017 echo Study Conclusions  - Left ventricle: The cavity size was normal. There was mild concentric hypertrophy. Systolic function was normal. The estimated ejection fraction was in the range of 50% to 55%. There was an increased relative contribution of atrial contraction to ventricular filling. Doppler parameters are consistent with abnormal left ventricular relaxation (grade 1 diastolic dysfunction). - Aortic valve: There was mild stenosis. Valve area (VTI): 1.66 cm^2. Valve area (Vmax): 1.71 cm^2. Valve area (Vmean): 1.62 cm^2. - Mitral valve: Calcified annulus. Mildly thickened leaflets . - Pulmonary arteries: PA peak pressure: 32 mm Hg (S).  Jan 2019 echo Study Conclusions  - Left ventricle: The cavity size was normal. There was mild concentric hypertrophy. Systolic function was moderately to severely reduced. The estimated ejection fraction was in the range of 30% to 35%. Diffuse hypokinesis. Doppler parameters are consistent with abnormal left ventricular relaxation (grade 1 diastolic dysfunction). - Aortic valve: Probably trileaflet; moderately thickened, severely calcified leaflets. There was mild stenosis. Peak gradient (S): 13 mm Hg. Valve area (Vmax): 1.26 cm^2. - Mitral valve: Calcified annulus. Mildly thickened leaflets . There was mild regurgitation. - Right ventricle: The cavity size was normal. Wall thickness was normal. Systolic  function was normal. - Right atrium: The atrium was normal in size. - Pulmonary arteries: Systolic pressure could not be accurately estimated. - Inferior vena cava: The vessel was normal in size. - Pericardium, extracardiac: There was no pericardial effusion.  Impressions:  - Since the last study on 11/24/17 LVEF has decreased from 50% to 30-35% with diffuse hypokinesis.  Jan 2020 echo Study Conclusions  - Left ventricle: The cavity size was normal. Wall thickness was normal. Systolic  function was severely reduced. The estimated ejection fraction was in the range of 20% to 25%. Diffuse hypokinesis. Doppler parameters are consistent with restrictive physiology, indicative of decreased left ventricular diastolic compliance and/or increased left atrial pressure. Doppler parameters are consistent with high ventricular filling pressure. - Aortic valve: There was mild stenosis. Valve area (VTI): 1.69 cm^2. Valve area (Vmax): 1.73 cm^2. Valve area (Vmean): 1.74 cm^2. - Mitral valve: Calcified annulus. There was mild regurgitation. - Left atrium: The atrium was mildly dilated. - Atrial septum: No defect or patent foramen ovale was identified. - Tricuspid valve: There was mild regurgitation. - Pulmonary arteries: PA peak pressure: 40 mm Hg (S).    Assessment and Plan  1.Chronic systolic HF - no recent symptoms - increase toprol to 150mg  daily - repeat echo   2. CAD - on coumadin and plavix due to afib and history of late stent thrombosis - no recent symptoms, continue current meds  3. PAF - DOACs too expensive,she was changed to coumadin - some recent palpitations, increase toprol to 150 mg daily     Arnoldo Lenis, M.D.

## 2020-03-22 NOTE — Patient Instructions (Signed)
Your physician recommends that you schedule a follow-up appointment in: [place] has recommended you make the following change in your medication:   INCREASE TOPROL XL 150 MG (1 AND 1/2 TABLETS) DAILY   Your physician has requested that you have an echocardiogram. Echocardiography is a painless test that uses sound waves to create images of your heart. It provides your doctor with information about the size and shape of your heart and how well your heart's chambers and valves are working. This procedure takes approximately one hour. There are no restrictions for this procedure.  Thank you for choosing [facility]!!

## 2020-03-28 ENCOUNTER — Other Ambulatory Visit: Payer: Self-pay | Admitting: *Deleted

## 2020-03-28 ENCOUNTER — Other Ambulatory Visit: Payer: Self-pay

## 2020-03-28 MED ORDER — ATORVASTATIN CALCIUM 80 MG PO TABS: 80.0000 mg | ORAL_TABLET | Freq: Every morning | ORAL | 2 refills | Status: DC

## 2020-03-28 MED ORDER — SPIRONOLACTONE 25 MG PO TABS: ORAL_TABLET | ORAL | 2 refills | Status: DC

## 2020-03-28 MED ORDER — METOPROLOL SUCCINATE ER 100 MG PO TB24: ORAL_TABLET | ORAL | 2 refills | Status: DC

## 2020-03-28 MED ORDER — CLOPIDOGREL BISULFATE 75 MG PO TABS: 75.0000 mg | ORAL_TABLET | Freq: Every day | ORAL | 2 refills | Status: DC

## 2020-03-29 ENCOUNTER — Other Ambulatory Visit: Payer: Self-pay

## 2020-03-29 ENCOUNTER — Ambulatory Visit (INDEPENDENT_AMBULATORY_CARE_PROVIDER_SITE_OTHER): Payer: Medicare Other

## 2020-03-29 DIAGNOSIS — Z5181 Encounter for therapeutic drug level monitoring: Secondary | ICD-10-CM

## 2020-03-29 DIAGNOSIS — I4891 Unspecified atrial fibrillation: Secondary | ICD-10-CM

## 2020-03-29 LAB — POCT INR: INR: 1 — AB (ref 2.0–3.0)

## 2020-03-29 MED ORDER — WARFARIN SODIUM 1 MG PO TABS: ORAL_TABLET | ORAL | 0 refills | Status: DC

## 2020-03-29 NOTE — Patient Instructions (Signed)
Description   Take 1.5 tablets today and tomorrow, then start taking warfarin 1 tablet daily. Recheck in 1 week.

## 2020-04-05 ENCOUNTER — Ambulatory Visit (INDEPENDENT_AMBULATORY_CARE_PROVIDER_SITE_OTHER): Payer: Medicare Other | Admitting: *Deleted

## 2020-04-05 ENCOUNTER — Other Ambulatory Visit: Payer: Self-pay

## 2020-04-05 DIAGNOSIS — I4891 Unspecified atrial fibrillation: Secondary | ICD-10-CM

## 2020-04-05 DIAGNOSIS — Z5181 Encounter for therapeutic drug level monitoring: Secondary | ICD-10-CM | POA: Diagnosis not present

## 2020-04-05 LAB — POCT INR: INR: 1.3 — AB (ref 2.0–3.0)

## 2020-04-05 NOTE — Patient Instructions (Signed)
Pt states she took the 1 1/2 tablets x 2 days as instructed but then took 2 tablets x 4 days instead of 1 tablet by mistake Take 3 tablets today then increase dose to 2 tablets daily until next INR check in 1 week

## 2020-04-09 ENCOUNTER — Other Ambulatory Visit: Payer: Self-pay | Admitting: *Deleted

## 2020-04-09 MED ORDER — WARFARIN SODIUM 1 MG PO TABS: ORAL_TABLET | ORAL | 4 refills | Status: DC

## 2020-04-12 ENCOUNTER — Ambulatory Visit (INDEPENDENT_AMBULATORY_CARE_PROVIDER_SITE_OTHER): Payer: Medicare Other | Admitting: *Deleted

## 2020-04-12 ENCOUNTER — Other Ambulatory Visit: Payer: Self-pay

## 2020-04-12 DIAGNOSIS — I4891 Unspecified atrial fibrillation: Secondary | ICD-10-CM | POA: Diagnosis not present

## 2020-04-12 DIAGNOSIS — Z5181 Encounter for therapeutic drug level monitoring: Secondary | ICD-10-CM | POA: Diagnosis not present

## 2020-04-12 LAB — POCT INR: INR: 2.4 (ref 2.0–3.0)

## 2020-04-12 NOTE — Patient Instructions (Signed)
Continue warfarin 2 tablets daily Recheck in 2 weeks 

## 2020-04-18 ENCOUNTER — Other Ambulatory Visit: Payer: Self-pay

## 2020-04-18 ENCOUNTER — Ambulatory Visit (INDEPENDENT_AMBULATORY_CARE_PROVIDER_SITE_OTHER): Payer: Medicare Other

## 2020-04-18 DIAGNOSIS — I5022 Chronic systolic (congestive) heart failure: Secondary | ICD-10-CM

## 2020-04-19 ENCOUNTER — Ambulatory Visit (HOSPITAL_COMMUNITY)
Admission: RE | Admit: 2020-04-19 | Discharge: 2020-04-19 | Disposition: A | Discharge status: Home / Self Care | Payer: Medicare Other | Source: Ambulatory Visit | Attending: Orthopaedic Surgery | Admitting: Orthopaedic Surgery

## 2020-04-19 DIAGNOSIS — M25512 Pain in left shoulder: Secondary | ICD-10-CM | POA: Diagnosis not present

## 2020-04-19 DIAGNOSIS — G8929 Other chronic pain: Secondary | ICD-10-CM | POA: Insufficient documentation

## 2020-04-24 ENCOUNTER — Other Ambulatory Visit: Payer: Self-pay

## 2020-04-24 ENCOUNTER — Emergency Department (HOSPITAL_COMMUNITY): Payer: Medicare Other

## 2020-04-24 ENCOUNTER — Encounter: Payer: Self-pay | Admitting: Orthopaedic Surgery

## 2020-04-24 ENCOUNTER — Encounter (HOSPITAL_COMMUNITY): Payer: Self-pay

## 2020-04-24 ENCOUNTER — Inpatient Hospital Stay (HOSPITAL_COMMUNITY)
Admission: EM | Admit: 2020-04-24 | Discharge: 2020-04-26 | DRG: 092 | Disposition: A | Discharge status: Home Health | Payer: Medicare Other | Attending: Family Medicine | Admitting: Family Medicine

## 2020-04-24 ENCOUNTER — Ambulatory Visit (INDEPENDENT_AMBULATORY_CARE_PROVIDER_SITE_OTHER): Payer: Medicare Other | Admitting: Orthopaedic Surgery

## 2020-04-24 VITALS — BP 138/76 | HR 77 | Ht 60.0 in | Wt 226.0 lb

## 2020-04-24 DIAGNOSIS — N2889 Other specified disorders of kidney and ureter: Secondary | ICD-10-CM | POA: Diagnosis present

## 2020-04-24 DIAGNOSIS — I350 Nonrheumatic aortic (valve) stenosis: Secondary | ICD-10-CM | POA: Diagnosis present

## 2020-04-24 DIAGNOSIS — G8929 Other chronic pain: Secondary | ICD-10-CM | POA: Diagnosis not present

## 2020-04-24 DIAGNOSIS — M545 Low back pain, unspecified: Secondary | ICD-10-CM

## 2020-04-24 DIAGNOSIS — I5042 Chronic combined systolic (congestive) and diastolic (congestive) heart failure: Secondary | ICD-10-CM | POA: Diagnosis present

## 2020-04-24 DIAGNOSIS — M549 Dorsalgia, unspecified: Secondary | ICD-10-CM

## 2020-04-24 DIAGNOSIS — H532 Diplopia: Secondary | ICD-10-CM | POA: Diagnosis present

## 2020-04-24 DIAGNOSIS — I2510 Atherosclerotic heart disease of native coronary artery without angina pectoris: Secondary | ICD-10-CM

## 2020-04-24 DIAGNOSIS — Z8249 Family history of ischemic heart disease and other diseases of the circulatory system: Secondary | ICD-10-CM

## 2020-04-24 DIAGNOSIS — M25512 Pain in left shoulder: Secondary | ICD-10-CM | POA: Diagnosis not present

## 2020-04-24 DIAGNOSIS — M48061 Spinal stenosis, lumbar region without neurogenic claudication: Secondary | ICD-10-CM | POA: Diagnosis present

## 2020-04-24 DIAGNOSIS — K439 Ventral hernia without obstruction or gangrene: Secondary | ICD-10-CM | POA: Diagnosis present

## 2020-04-24 DIAGNOSIS — F419 Anxiety disorder, unspecified: Secondary | ICD-10-CM | POA: Diagnosis present

## 2020-04-24 DIAGNOSIS — I480 Paroxysmal atrial fibrillation: Secondary | ICD-10-CM | POA: Diagnosis present

## 2020-04-24 DIAGNOSIS — R1084 Generalized abdominal pain: Secondary | ICD-10-CM

## 2020-04-24 DIAGNOSIS — Z955 Presence of coronary angioplasty implant and graft: Secondary | ICD-10-CM

## 2020-04-24 DIAGNOSIS — R918 Other nonspecific abnormal finding of lung field: Secondary | ICD-10-CM | POA: Diagnosis present

## 2020-04-24 DIAGNOSIS — E785 Hyperlipidemia, unspecified: Secondary | ICD-10-CM | POA: Diagnosis present

## 2020-04-24 DIAGNOSIS — C7951 Secondary malignant neoplasm of bone: Secondary | ICD-10-CM | POA: Diagnosis present

## 2020-04-24 DIAGNOSIS — Z7902 Long term (current) use of antithrombotics/antiplatelets: Secondary | ICD-10-CM

## 2020-04-24 DIAGNOSIS — Z8241 Family history of sudden cardiac death: Secondary | ICD-10-CM

## 2020-04-24 DIAGNOSIS — Z6841 Body Mass Index (BMI) 40.0 and over, adult: Secondary | ICD-10-CM

## 2020-04-24 DIAGNOSIS — J449 Chronic obstructive pulmonary disease, unspecified: Secondary | ICD-10-CM | POA: Diagnosis present

## 2020-04-24 DIAGNOSIS — Z515 Encounter for palliative care: Secondary | ICD-10-CM | POA: Diagnosis present

## 2020-04-24 DIAGNOSIS — Z20822 Contact with and (suspected) exposure to covid-19: Secondary | ICD-10-CM | POA: Diagnosis present

## 2020-04-24 DIAGNOSIS — I471 Supraventricular tachycardia: Secondary | ICD-10-CM | POA: Diagnosis present

## 2020-04-24 DIAGNOSIS — Z825 Family history of asthma and other chronic lower respiratory diseases: Secondary | ICD-10-CM

## 2020-04-24 DIAGNOSIS — I252 Old myocardial infarction: Secondary | ICD-10-CM

## 2020-04-24 DIAGNOSIS — I447 Left bundle-branch block, unspecified: Secondary | ICD-10-CM | POA: Diagnosis present

## 2020-04-24 DIAGNOSIS — K5730 Diverticulosis of large intestine without perforation or abscess without bleeding: Secondary | ICD-10-CM | POA: Diagnosis present

## 2020-04-24 DIAGNOSIS — F32A Depression, unspecified: Secondary | ICD-10-CM | POA: Diagnosis present

## 2020-04-24 DIAGNOSIS — Z87891 Personal history of nicotine dependence: Secondary | ICD-10-CM

## 2020-04-24 DIAGNOSIS — I110 Hypertensive heart disease with heart failure: Secondary | ICD-10-CM | POA: Diagnosis present

## 2020-04-24 DIAGNOSIS — I890 Lymphedema, not elsewhere classified: Secondary | ICD-10-CM | POA: Diagnosis present

## 2020-04-24 DIAGNOSIS — Z79899 Other long term (current) drug therapy: Secondary | ICD-10-CM

## 2020-04-24 DIAGNOSIS — F3181 Bipolar II disorder: Secondary | ICD-10-CM | POA: Diagnosis present

## 2020-04-24 DIAGNOSIS — K5900 Constipation, unspecified: Secondary | ICD-10-CM | POA: Diagnosis present

## 2020-04-24 DIAGNOSIS — Z923 Personal history of irradiation: Secondary | ICD-10-CM

## 2020-04-24 DIAGNOSIS — R791 Abnormal coagulation profile: Secondary | ICD-10-CM | POA: Diagnosis present

## 2020-04-24 DIAGNOSIS — M5116 Intervertebral disc disorders with radiculopathy, lumbar region: Secondary | ICD-10-CM | POA: Diagnosis present

## 2020-04-24 DIAGNOSIS — Z888 Allergy status to other drugs, medicaments and biological substances status: Secondary | ICD-10-CM

## 2020-04-24 DIAGNOSIS — C50919 Malignant neoplasm of unspecified site of unspecified female breast: Secondary | ICD-10-CM | POA: Diagnosis present

## 2020-04-24 DIAGNOSIS — M479 Spondylosis, unspecified: Secondary | ICD-10-CM | POA: Diagnosis present

## 2020-04-24 DIAGNOSIS — R32 Unspecified urinary incontinence: Secondary | ICD-10-CM | POA: Diagnosis present

## 2020-04-24 DIAGNOSIS — Z7901 Long term (current) use of anticoagulants: Secondary | ICD-10-CM

## 2020-04-24 LAB — CBC WITH DIFFERENTIAL/PLATELET
Abs Immature Granulocytes: 0.02 10*3/uL (ref 0.00–0.07)
Basophils Absolute: 0 10*3/uL (ref 0.0–0.1)
Basophils Relative: 1 %
Eosinophils Absolute: 0.2 10*3/uL (ref 0.0–0.5)
Eosinophils Relative: 5 %
HCT: 34.8 % — ABNORMAL LOW (ref 36.0–46.0)
Hemoglobin: 11.3 g/dL — ABNORMAL LOW (ref 12.0–15.0)
Immature Granulocytes: 0 %
Lymphocytes Relative: 21 %
Lymphs Abs: 1.1 10*3/uL (ref 0.7–4.0)
MCH: 32.4 pg (ref 26.0–34.0)
MCHC: 32.5 g/dL (ref 30.0–36.0)
MCV: 99.7 fL (ref 80.0–100.0)
Monocytes Absolute: 0.4 10*3/uL (ref 0.1–1.0)
Monocytes Relative: 8 %
Neutro Abs: 3.3 10*3/uL (ref 1.7–7.7)
Neutrophils Relative %: 65 %
Platelets: 189 10*3/uL (ref 150–400)
RBC: 3.49 MIL/uL — ABNORMAL LOW (ref 3.87–5.11)
RDW: 13.5 % (ref 11.5–15.5)
WBC: 5 10*3/uL (ref 4.0–10.5)
nRBC: 0 % (ref 0.0–0.2)

## 2020-04-24 LAB — COMPREHENSIVE METABOLIC PANEL
ALT: 17 U/L (ref 0–44)
AST: 23 U/L (ref 15–41)
Albumin: 3.3 g/dL — ABNORMAL LOW (ref 3.5–5.0)
Alkaline Phosphatase: 44 U/L (ref 38–126)
Anion gap: 8 (ref 5–15)
BUN: 29 mg/dL — ABNORMAL HIGH (ref 8–23)
CO2: 30 mmol/L (ref 22–32)
Calcium: 8.8 mg/dL — ABNORMAL LOW (ref 8.9–10.3)
Chloride: 102 mmol/L (ref 98–111)
Creatinine, Ser: 0.73 mg/dL (ref 0.44–1.00)
GFR calc Af Amer: >60 mL/min (ref >60)
GFR calc non Af Amer: >60 mL/min (ref >60)
Glucose, Bld: 129 mg/dL — ABNORMAL HIGH (ref 70–99)
Potassium: 4.2 mmol/L (ref 3.5–5.1)
Sodium: 140 mmol/L (ref 135–145)
Total Bilirubin: 1.3 mg/dL — ABNORMAL HIGH (ref 0.3–1.2)
Total Protein: 6 g/dL — ABNORMAL LOW (ref 6.5–8.1)

## 2020-04-24 LAB — PROTIME-INR
INR: 3.3 — ABNORMAL HIGH (ref 0.8–1.2)
Prothrombin Time: 32.4 seconds — ABNORMAL HIGH (ref 11.4–15.2)

## 2020-04-24 LAB — URINALYSIS, ROUTINE W REFLEX MICROSCOPIC
Bilirubin Urine: NEGATIVE
Glucose, UA: NEGATIVE mg/dL
Hgb urine dipstick: NEGATIVE
Ketones, ur: NEGATIVE mg/dL
Leukocytes,Ua: NEGATIVE
Nitrite: NEGATIVE
Protein, ur: NEGATIVE mg/dL
Specific Gravity, Urine: 1.044 — ABNORMAL HIGH (ref 1.005–1.030)
pH: 6 (ref 5.0–8.0)

## 2020-04-24 LAB — LIPASE, BLOOD: Lipase: 18 U/L (ref 11–51)

## 2020-04-24 LAB — SARS CORONAVIRUS 2 BY RT PCR (HOSPITAL ORDER, PERFORMED IN ~~LOC~~ HOSPITAL LAB): SARS Coronavirus 2: NEGATIVE

## 2020-04-24 MED ORDER — ATORVASTATIN CALCIUM 40 MG PO TABS
80.0000 mg | ORAL_TABLET | Freq: Every morning | ORAL | Status: DC
  Administered 2020-04-25 – 2020-04-26 (×2): 80 mg via ORAL
  Filled 2020-04-24 (×2): qty 2

## 2020-04-24 MED ORDER — ALBUTEROL SULFATE (2.5 MG/3ML) 0.083% IN NEBU: 3.0000 mL | INHALATION_SOLUTION | Freq: Four times a day (QID) | RESPIRATORY_TRACT | Status: DC | PRN

## 2020-04-24 MED ORDER — TORSEMIDE 20 MG PO TABS
10.0000 mg | ORAL_TABLET | Freq: Every day | ORAL | Status: DC
  Filled 2020-04-24 (×2): qty 1

## 2020-04-24 MED ORDER — IOHEXOL 300 MG/ML  SOLN
100.0000 mL | Freq: Once | INTRAMUSCULAR | Status: AC | PRN
  Administered 2020-04-24: 100 mL via INTRAVENOUS

## 2020-04-24 MED ORDER — POLYETHYLENE GLYCOL 3350 17 G PO PACK
17.0000 g | PACK | Freq: Every day | ORAL | Status: DC
  Administered 2020-04-26: 17 g via ORAL
  Filled 2020-04-24 (×2): qty 1

## 2020-04-24 MED ORDER — WARFARIN - PHARMACIST DOSING INPATIENT: Freq: Every day | Status: DC

## 2020-04-24 MED ORDER — LOSARTAN POTASSIUM 50 MG PO TABS
100.0000 mg | ORAL_TABLET | Freq: Every day | ORAL | Status: DC
  Administered 2020-04-25 – 2020-04-26 (×2): 100 mg via ORAL
  Filled 2020-04-24 (×2): qty 2

## 2020-04-24 MED ORDER — SPIRONOLACTONE 12.5 MG HALF TABLET
12.5000 mg | ORAL_TABLET | Freq: Every day | ORAL | Status: DC
  Administered 2020-04-25 – 2020-04-26 (×2): 12.5 mg via ORAL
  Filled 2020-04-24 (×3): qty 1

## 2020-04-24 MED ORDER — HYDROMORPHONE HCL 1 MG/ML IJ SOLN
0.5000 mg | INTRAMUSCULAR | Status: DC | PRN
  Administered 2020-04-24 – 2020-04-25 (×2): 0.5 mg via INTRAVENOUS
  Filled 2020-04-24 (×2): qty 0.5

## 2020-04-24 MED ORDER — METHOCARBAMOL 500 MG PO TABS
750.0000 mg | ORAL_TABLET | Freq: Three times a day (TID) | ORAL | Status: DC
  Administered 2020-04-24 – 2020-04-26 (×6): 750 mg via ORAL
  Filled 2020-04-24 (×6): qty 2

## 2020-04-24 MED ORDER — METOPROLOL SUCCINATE ER 50 MG PO TB24
150.0000 mg | ORAL_TABLET | Freq: Every day | ORAL | Status: DC
  Administered 2020-04-25 – 2020-04-26 (×2): 150 mg via ORAL
  Filled 2020-04-24 (×2): qty 3

## 2020-04-24 MED ORDER — DULOXETINE HCL 30 MG PO CPEP
30.0000 mg | ORAL_CAPSULE | Freq: Every day | ORAL | Status: DC
  Administered 2020-04-25 – 2020-04-26 (×2): 30 mg via ORAL
  Filled 2020-04-24 (×2): qty 1

## 2020-04-24 MED ORDER — DULOXETINE HCL 60 MG PO CPEP
60.0000 mg | ORAL_CAPSULE | Freq: Every day | ORAL | Status: DC
  Administered 2020-04-25 – 2020-04-26 (×2): 60 mg via ORAL
  Filled 2020-04-24 (×2): qty 1

## 2020-04-24 MED ORDER — MORPHINE SULFATE (PF) 4 MG/ML IV SOLN
4.0000 mg | Freq: Once | INTRAVENOUS | Status: AC
  Administered 2020-04-24: 4 mg via INTRAVENOUS
  Filled 2020-04-24: qty 1

## 2020-04-24 MED ORDER — OXYCODONE-ACETAMINOPHEN 5-325 MG PO TABS
1.0000 | ORAL_TABLET | Freq: Once | ORAL | Status: AC
  Administered 2020-04-24: 1 via ORAL
  Filled 2020-04-24: qty 1

## 2020-04-24 MED ORDER — PREGABALIN 50 MG PO CAPS
100.0000 mg | ORAL_CAPSULE | Freq: Every day | ORAL | Status: AC
  Administered 2020-04-24 – 2020-04-25 (×2): 100 mg via ORAL
  Filled 2020-04-24 (×2): qty 2

## 2020-04-24 MED ORDER — ONDANSETRON HCL 4 MG/2ML IJ SOLN
4.0000 mg | Freq: Four times a day (QID) | INTRAMUSCULAR | Status: DC | PRN
  Administered 2020-04-25: 4 mg via INTRAVENOUS
  Filled 2020-04-24: qty 2

## 2020-04-24 MED ORDER — CLOPIDOGREL BISULFATE 75 MG PO TABS
75.0000 mg | ORAL_TABLET | Freq: Every day | ORAL | Status: DC
  Administered 2020-04-25 – 2020-04-26 (×2): 75 mg via ORAL
  Filled 2020-04-24 (×2): qty 1

## 2020-04-24 MED ORDER — ACETAMINOPHEN 650 MG RE SUPP: 650.0000 mg | Freq: Four times a day (QID) | RECTAL | Status: DC | PRN

## 2020-04-24 MED ORDER — ONDANSETRON HCL 4 MG/2ML IJ SOLN
4.0000 mg | Freq: Once | INTRAMUSCULAR | Status: AC
  Administered 2020-04-24: 4 mg via INTRAVENOUS
  Filled 2020-04-24: qty 2

## 2020-04-24 MED ORDER — ACETAMINOPHEN 325 MG PO TABS: 650.0000 mg | ORAL_TABLET | Freq: Four times a day (QID) | ORAL | Status: DC | PRN

## 2020-04-24 MED ORDER — LETROZOLE 2.5 MG PO TABS
2.5000 mg | ORAL_TABLET | Freq: Every day | ORAL | Status: DC
  Administered 2020-04-25: 2.5 mg via ORAL
  Filled 2020-04-24 (×3): qty 1

## 2020-04-24 MED ORDER — ONDANSETRON HCL 4 MG PO TABS: 4.0000 mg | ORAL_TABLET | Freq: Four times a day (QID) | ORAL | Status: DC | PRN

## 2020-04-24 NOTE — Progress Notes (Signed)
ANTICOAGULATION CONSULT NOTE - Initial Consult  Pharmacy Consult for warfarin Indication: atrial fibrillation  Allergies  Allergen Reactions  . Brilinta [Ticagrelor] Other (See Comments)    Nausea and severe diarrhea- general weakness. Patient says does not want to take again  . Albuterol Other (See Comments)    Heart racing     Patient Measurements: Height: 5' (152.4 cm) Weight: 97.2 kg (214 lb 4.6 oz) IBW/kg (Calculated) : 45.5 Heparin Dosing Weight: 69 kg  Vital Signs: Temp: 98.2 F (36.8 C) (05/25 2055) Temp Source: Oral (05/25 2055) BP: 148/62 (05/25 2055) Pulse Rate: 50 (05/25 2055)  Labs: Recent Labs    04/24/20 1330  HGB 11.3*  HCT 34.8*  PLT 189  LABPROT 32.4*  INR 3.3*  CREATININE 0.73    Estimated Creatinine Clearance: 65.5 mL/min (by C-G formula based on SCr of 0.73 mg/dL).   Medical History: Past Medical History:  Diagnosis Date  . CAD in native artery    a. DES to ramus 2005 with late stent thrombosis 2006 tx with PTCA. 12/18 PCI/DES x1 to mRCA, EF 50-55%  . CHF (congestive heart failure) ([place])   . Chronic pain   . COPD (chronic obstructive pulmonary disease) ([place])   . Hyperlipidemia   . Hypertension   . Left bundle branch block   . Lymphedema   . Metastatic breast cancer ([place])    a. to bone.  . MI (myocardial infarction) ([place])   . Mild aortic stenosis 10/2017  . Morbid obesity ([place])   . PAF (paroxysmal atrial fibrillation) ([place])   . PSVT (paroxysmal supraventricular tachycardia) ([place])    a. per [facility] notes, seen on event monitor in 2014.  . Pulmonary nodules     Medications:  Scheduled:  . [START ON 04/25/2020] atorvastatin  80 mg Oral q morning - 10a  . [START ON 04/25/2020] clopidogrel  75 mg Oral Daily  . [START ON 04/25/2020] DULoxetine  30 mg Oral Daily  . [START ON 04/25/2020] DULoxetine  60 mg Oral Daily  . letrozole  2.5 mg Oral QHS  . [START ON 04/25/2020] losartan  100 mg Oral Daily  . methocarbamol  750 mg Oral TID  .  [START ON 04/25/2020] metoprolol succinate  150 mg Oral Daily  . polyethylene glycol  17 g Oral Daily  . pregabalin  100 mg Oral QHS  . [START ON 04/25/2020] spironolactone  12.5 mg Oral Daily  . [START ON 04/25/2020] torsemide  10 mg Oral Daily    Assessment: 18 yof presenting with worsening low back, flank, and abdominal pain- has known metastatic breast cancer to lumbar spine. On warfarin PTA for hx Afib - LD 5/25.   INR is supratherpeutic at 3.3. Hgb 11.3, plt 189. No s/sx of bleeding.   PTA regimen is 2 mg daily.   Goal of Therapy:  INR 2-3 Monitor platelets by anticoagulation protocol: Yes   Plan:  No warfarin tonight since already received dose today Monitor daily INR   Antonietta Jewel, PharmD, BCCCP Clinical Pharmacist  04/24/2020 10:48 PM  Please check AMION for all [facility] phone numbers After 10:00 PM, call [place] [phone]

## 2020-04-24 NOTE — H&P (Signed)
History and Physical    [name] [id number] DOB: [birth year] DOA: 04/24/2020  PCP: Patient, No Pcp Per   Patient coming from: Home  I have personally briefly reviewed patient's old medical records in [place]  Chief Complaint: Worsening low back pain  HPI: [name] is a 74 y.o. female with medical history significant for systolic and diastolic CHF, COPD, atrial fibrillation, metastatic breast cancer, hypertension, left bundle branch block, coronary artery disease. Patient presented to the ED with reports of worsening low back pain over the past 2 weeks.  Patient has baseline chronic back pain.  But she reports this has worsened significantly over the past 2 weeks, radiating from her back anteriorly towards her abdomen and right groin.  She is unable to walk due to pain, which she has no weakness of her extremities.  No abnormal sensation.  She has chronic urinary incontinence for which she wears depends, no bowel changes.  She denies falls.   She had sacral iliac joint injections by [facility] pain medicine clinic last injection - 03/16/20, the first helped but the second did not help.  Home pain medications are no longer helping.  She has tried several topical treatments without improvement also tried duloxetine and pregabalin. She denies vomiting or loose stools, no difficulty breathing no cough, no fever or chills.  Patient follows with an oncologist at [facility].  Also with palliative care and pain medicine clinic.  ED Course: Heart rate 50s to 60s, blood pressure 110s to 140s, O2 sats greater than 93% on room air, momentarily dropped to 88% after morphine and oxycodone given in ED.  Abdominal CT-colonic diverticulosis, stable small periumbilical ventral hernia.  Review of Systems: As per HPI all other systems reviewed and negative.  Past Medical History:  Diagnosis Date  . CAD in native artery    a. DES to ramus 2005 with late stent thrombosis 2006 tx with PTCA. 12/18  PCI/DES x1 to mRCA, EF 50-55%  . CHF (congestive heart failure) ([place])   . Chronic pain   . COPD (chronic obstructive pulmonary disease) ([place])   . Hyperlipidemia   . Hypertension   . Left bundle branch block   . Lymphedema   . Metastatic breast cancer ([place])    a. to bone.  . MI (myocardial infarction) ([place])   . Mild aortic stenosis 10/2017  . Morbid obesity ([place])   . PAF (paroxysmal atrial fibrillation) ([place])   . PSVT (paroxysmal supraventricular tachycardia) ([place])    a. per [facility] notes, seen on event monitor in 2014.  . Pulmonary nodules     Past Surgical History:  Procedure Laterality Date  . CORONARY STENT INTERVENTION N/A 11/26/2017   Procedure: CORONARY STENT INTERVENTION;  Surgeon: Martinique, Peter M, MD;  Location: [place] [facility];  Service: Cardiovascular;  Laterality: N/A;  . CORONARY STENT PLACEMENT    . LEFT HEART CATH AND CORONARY ANGIOGRAPHY N/A 11/26/2017   Procedure: LEFT HEART CATH AND CORONARY ANGIOGRAPHY;  Surgeon: Martinique, Peter M, MD;  Location: [place] [facility];  Service: Cardiovascular;  Laterality: N/A;     reports that she quit smoking about 39 years ago. Her smoking use included cigarettes. She has a 36.00 pack-year smoking history. She has never used smokeless tobacco. She reports that she does not drink alcohol or use drugs.  Allergies  Allergen Reactions  . Brilinta [Ticagrelor] Other (See Comments)    Nausea and severe diarrhea- general weakness. Patient says does not want to take again  .  Albuterol Other (See Comments)    Heart racing     Family History  Problem Relation Age of Onset  . CAD Father 5  . Heart attack Father   . COPD Sister   . CAD Paternal Grandmother   . Sudden Cardiac Death Neg Hx     Acceptable: Family history reviewed and not pertinent (If you reviewed it)  Prior to Admission medications   Medication Sig Start Date End Date Taking? Authorizing Provider  acetaminophen (TYLENOL) 500 MG tablet Take 500 mg by mouth every  6 (six) hours as needed.   Yes [provider]  albuterol (ACCUNEB) 0.63 MG/3ML nebulizer solution Take 1 ampule by nebulization every 6 (six) hours as needed for shortness of breath (summer time.).   Yes [provider]  atorvastatin (LIPITOR) 80 MG tablet Take 1 tablet (80 mg total) by mouth every morning. Patient taking differently: Take 80 mg by mouth every evening.  03/28/20  Yes BranchAlphonse Guild, MD  b complex vitamins tablet Take 1 tablet by mouth every morning.    Yes [provider]  Calcium Citrate-Vitamin D (CITRACAL MAXIMUM) 315-250 MG-UNIT TABS Take 2 tablets by mouth daily.    Yes [provider]  clopidogrel (PLAVIX) 75 MG tablet Take 1 tablet (75 mg total) by mouth daily. 03/28/20  Yes BranchAlphonse Guild, MD  DULoxetine (CYMBALTA) 30 MG capsule Take 30 mg by mouth daily.   Yes [provider]  DULoxetine (CYMBALTA) 60 MG capsule Take 1 capsule by mouth daily. 08/01/19  Yes [provider]  letrozole (FEMARA) 2.5 MG tablet Take 2.5 mg by mouth at bedtime.    Yes [provider]  lidocaine-prilocaine (EMLA) cream Apply 1 application topically daily as needed (knee pain).  06/28/19  Yes [provider]  losartan (COZAAR) 100 MG tablet Take 1 tablet (100 mg total) by mouth daily. 03/25/19  Yes BranchAlphonse Guild, MD  metoprolol succinate (TOPROL-XL) 100 MG 24 hr tablet TAKE 1 AND 1/2 TABLETS DAILY 03/28/20  Yes Branch, Alphonse Guild, MD  Multiple Vitamins-Minerals (EQ VISION FORMULA 50+ PO) Take 1 tablet by mouth every morning.   Yes [provider]  Multiple Vitamins-Minerals (HAIR/SKIN/NAILS) CAPS Take 1 capsule by mouth every morning.   Yes [provider]  nitroGLYCERIN (NITROSTAT) 0.4 MG SL tablet Place 1 tablet (0.4 mg total) under the tongue every 5 (five) minutes x 3 doses as needed for chest pain (if no relief after 3rd dose, proceed to the ED or call 911). 03/22/20  Yes Branch, Alphonse Guild, MD    oxyCODONE (OXY IR/ROXICODONE) 5 MG immediate release tablet Take 5 mg by mouth every 6 (six) hours as needed.  06/15/19  Yes [provider]  Potassium 99 MG TABS Take 2 tablets by mouth every morning.    Yes [provider]  pregabalin (LYRICA) 100 MG capsule Take 1 capsule by mouth at bedtime.  08/01/19  Yes [provider]  spironolactone (ALDACTONE) 25 MG tablet TAKE 1/2 TABLET(12.5 MG) BY MOUTH DAILY 03/28/20  Yes Branch, Alphonse Guild, MD  torsemide (DEMADEX) 20 MG tablet Take 10 mg by mouth daily.   Yes [provider]  Ubiquinol (QUNOL COQ10/UBIQUINOL/MEGA) 100 MG CAPS Take 1 capsule by mouth every morning. QUNOL-Ultra COQ10   Yes [provider]  warfarin (COUMADIN) 1 MG tablet Take 2 tablets daily or as directed by [facility] 04/09/20  Yes Branch, Alphonse Guild, MD    Physical Exam: Vitals:   04/24/20 1930  04/24/20 1930 04/24/20 2047 04/24/20 2055  BP: (!) 126/57  (!) 148/62 (!) 148/62  Pulse: 63  (!) 53 (!) 50  Resp: (!) 22  20 20   Temp:  97.7 F (36.5 C) 98.2 F (36.8 C) 98.2 F (36.8 C)  TempSrc:  Oral Oral Oral  SpO2: 93%  93% 93%  Weight:   97.2 kg   Height:   5' (1.524 m)     Constitutional: NAD, calm, comfortable Vitals:   04/24/20 1930 04/24/20 1930 04/24/20 2047 04/24/20 2055  BP: (!) 126/57  (!) 148/62 (!) 148/62  Pulse: 63  (!) 53 (!) 50  Resp: (!) 22  20 20   Temp:  97.7 F (36.5 C) 98.2 F (36.8 C) 98.2 F (36.8 C)  TempSrc:  Oral Oral Oral  SpO2: 93%  93% 93%  Weight:   97.2 kg   Height:   5' (1.524 m)    Eyes: PERRL, lids and conjunctivae normal ENMT: Mucous membranes are moist. Neck: normal, supple, no masses, no thyromegaly Respiratory: clear to auscultation bilaterally, no wheezing, no crackles. Normal respiratory effort. No accessory muscle use.  Cardiovascular: Regular rate and rhythm, no murmurs / rubs / gallops. No extremity edema. 2+ pedal pulses. No carotid bruits.  Abdomen: no tenderness, no  masses palpated. No hepatosplenomegaly. Bowel sounds positive.  Musculoskeletal: Tenderness to lower back also upper buttock area, No joint deformity upper and lower extremities.  Able to move lower extremities bilaterally, but not significantly against her gravity due to pain.  Sensation intact. Skin: no rashes, lesions, ulcers. No induration Neurologic: 4/5 strength in all extremities, Psychiatric: Normal judgment and insight. Alert and oriented x 3. Normal mood.   Labs on Admission: I have personally reviewed following labs and imaging studies  CBC: Recent Labs  Lab 04/24/20 1330  WBC 5.0  NEUTROABS 3.3  HGB 11.3*  HCT 34.8*  MCV 99.7  PLT 99991111   Basic Metabolic Panel: Recent Labs  Lab 04/24/20 1330  NA 140  K 4.2  CL 102  CO2 30  GLUCOSE 129*  BUN 29*  CREATININE 0.73  CALCIUM 8.8*   Liver Function Tests: Recent Labs  Lab 04/24/20 1330  AST 23  ALT 17  ALKPHOS 44  BILITOT 1.3*  PROT 6.0*  ALBUMIN 3.3*   Recent Labs  Lab 04/24/20 1330  LIPASE 18   Coagulation Profile: Recent Labs  Lab 04/24/20 1330  INR 3.3*   Urine analysis:    Component Value Date/Time   COLORURINE AMBER (A) 04/24/2020 1317   APPEARANCEUR HAZY (A) 04/24/2020 1317   LABSPEC 1.044 (H) 04/24/2020 1317   PHURINE 6.0 04/24/2020 1317   GLUCOSEU NEGATIVE 04/24/2020 1317   [place] 04/24/2020 [place] 04/24/2020 1317   [place] 04/24/2020 [place] 04/24/2020 1317   NITRITE NEGATIVE 04/24/2020 1317   LEUKOCYTESUR NEGATIVE 04/24/2020 1317    Radiological Exams on Admission: CT ABDOMEN PELVIS W CONTRAST  Result Date: 04/24/2020 CLINICAL DATA:  Lower abdominal and back pain for 2 weeks. Suspected diverticulitis. EXAM: CT ABDOMEN AND PELVIS WITH CONTRAST TECHNIQUE: Multidetector CT imaging of the abdomen and pelvis was performed using the standard protocol following bolus administration of intravenous contrast. CONTRAST:  126mL  OMNIPAQUE IOHEXOL 300 MG/ML  SOLN COMPARISON:  Noncontrast CT on 10/06/2018 FINDINGS: Lower Chest: No acute findings. Hepatobiliary: No hepatic masses identified. Tiny sub-cm cysts noted left lobe. Gallbladder is unremarkable. No evidence of biliary ductal dilatation. Pancreas:  No mass or inflammatory changes.  Spleen: Within normal limits in size and appearance. Adrenals/Urinary Tract: Normal appearance of adrenal glands and right kidney. A 1.7 cm subcapsular lesion is seen lateral interpolar region of the left kidney, increased in size since previous study. This shows internal intermediate attenuation which may be due to contrast enhancement or proteinaceous fluid. No evidence of ureteral calculi or hydronephrosis. Unremarkable unopacified urinary bladder. Stomach/Bowel: No evidence of obstruction, inflammatory process or abnormal fluid collections. Diverticulosis is seen mainly involving the sigmoid colon, however there is no evidence of diverticulitis. Vascular/Lymphatic: No pathologically enlarged lymph nodes. Aortic atherosclerosis noted. No abdominal aortic aneurysm. Reproductive: Prior hysterectomy noted. Adnexal regions are unremarkable in appearance. Other: A small paraumbilical ventral hernia is again seen which contains only omental fat. This shows no significant change compared to prior study. Musculoskeletal:  No suspicious bone lesions identified. IMPRESSION: 1. Colonic diverticulosis, without radiographic evidence of diverticulitis. 2. Stable small paraumbilical ventral hernia containing only omental fat. 3. 1.7 cm nonspecific lesion in left kidney shows mild increase in size, and could represent a solid neoplasm complex cyst. Abdomen MRI without and with contrast is recommended for further characterization. Aortic Atherosclerosis (ICD10-I70.0). Electronically Signed   By: Marlaine Hind M.D.   On: 04/24/2020 15:09    EKG: None.  Assessment/Plan Active Problems:   Intractable back  pain  Intractable back pain-likely secondary to lumbar spine metastatic disease. 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  Her persistent sciatica type pain they suspect is due to combination of conus/nerve root impingement from tumor and degenerative disc disease/arthritis.  Per notes there is no role for further radiation at this time given extensive prior radiation in this area lack of clear to more related progression.  She was to continue to follow-up with palliative her pain clinic. - Last lumbar spine MRI 04/02/2020- Redemonstration of enhancing lesions in the L1 and L2 vertebral bodies consistent with metastatic disease status post prior radiation therapy. Increased enhancement of the L1 vertebral bodies concerning for progression. No new metastases are identified. 2. Redemonstration of large calcified disc extrusion at L1-L2 resulting in severe spinal canal narrowing and severe right neural foraminal narrowing. 3. Multilevel degenerative disc disease and facet arthrosis worst at L2-L3 where there is moderate right neuroforaminal narrowing and L4-L5 where there is severe left neural foraminal narrowing.  -No improvement with morphine for pain, will use Dilaudid 0.5 mg every 4 hourly, and adjust dose as tolerated -Methocarbamol 750 3 times daily -PT evaluation -Palliative care consult -Resume pregabalin, Cymbalta  CT findings - 1.7 cm nonspecific lesion in left kidney shows mild increase in size, and could represent a solid neoplasm complex cyst. Abdomen MRI without and with contrast is recommended for further characterization. -Follow-up as out patient.  Metastatic breast cancer-as documented above. -Resume home letrozole  Systolic and diastolic CHF-stable and  compensated.  Last echo 5/21 shows EF 35 to [id number], grade 1 diastolic dysfunction. -Home torsemide 10 mg daily, spironolactone 12.5 mg daily  COPD-stable. -As needed albuterol nebulizer  Atrial fibrillation-rate controlled and on anticoagulation with warfarin.  INR 3.3. -Resume warfarin, pharmacy to dose -Resume home metoprolol.  Hypertension-stable. -Resume home metoprolol, losartan, torsemide and spironolactone.  History of coronary artery disease -Resume home Plavix, warfarin.  DVT prophylaxis: Warfarin Code Status: Full code Family Communication: None at bedside Disposition Plan: 1  to 2 days Consults called: Palliative care Admission status: Observation, telemetry   Bethena Roys MD [facility]  04/24/2020, 10:22 PM

## 2020-04-24 NOTE — ED Provider Notes (Signed)
[facility] EMERGENCY DEPARTMENT Provider Note   CSN: [id number] Arrival date & time: 04/24/20  1023     History No chief complaint on file.   Stacey [name] is a 74 y.o. female with a history as outlined below, most significant for metastatic breast cancer with 2 mets of her L1 and L2 vertebral bodies with with degenerative disc disease at this level, also with severe spinal canal narrowing per MRI from [facility] obtained May 3, full report below, presenting with intractable low back, flank and now abdominal pain which has been worse for the past 2 weeks.  She describes sharp stabbing pain predominantly in her bilateral lower abdominal quadrants which is constant, but is worse with movement.  She denies fevers or chills, she does endorse chronic constipation which does respond to dosing of MiraLAX.  She has had no dysuria.  She does endorse chronic low back pain with radiation into her upper thighs which historically has not radiated into her abdomen, this radiation is new over the past 2 weeks.  She has had no nausea or vomiting.  She is taking oxycodone 2.5 mg every 3 hours as recommended by her palliative care specialist at [facility] which is not relieving her pain symptoms.  As of April 8, CT imaging of her chest abdomen and pelvis were stable with no found metastatic disease, but was questionable for possible left renal cell carcinoma.  She was unable to move today given severity of pain.  Her son had to help her dress this morning prior to arrival due to intensity of pain.  The history is provided by the patient.      MRI from [facility] 04/02/20 1.  Redemonstration of enhancing lesions in the L1 and L2 vertebral bodies consistent with metastatic disease status post prior radiation therapy. Increased enhancement of the L1 vertebral bodies concerning for progression. No new metastases are identified. 2.  Redemonstration of large calcified disc extrusion at L1-L2 resulting in severe spinal canal  narrowing and severe right neural foraminal narrowing. 3.  Multilevel degenerative disc disease and facet arthrosis worst at L2-L3 where there is moderate right neuroforaminal narrowing and L4-L5 where there is severe left neural foraminal narrowing.    CT [facility] 03/08/20 IMPRESSION: 1. Unchanged right breast mass, right apical pulmonary nodule, and osseous metastases. 2. No new metastatic disease in the chest, abdomen, or pelvis. 3. Increase in size of an enhancing left upper pole renal lesion concerning for renal cell carcinoma.     Past Medical History:  Diagnosis Date  . CAD in native artery    a. DES to ramus 2005 with late stent thrombosis 2006 tx with PTCA. 12/18 PCI/DES x1 to mRCA, EF 50-55%  . CHF (congestive heart failure) ([place])   . Chronic pain   . COPD (chronic obstructive pulmonary disease) ([place])   . Hyperlipidemia   . Hypertension   . Left bundle branch block   . Lymphedema   . Metastatic breast cancer ([place])    a. to bone.  . MI (myocardial infarction) ([place])   . Mild aortic stenosis 10/2017  . Morbid obesity ([place])   . PAF (paroxysmal atrial fibrillation) ([place])   . PSVT (paroxysmal supraventricular tachycardia) ([place])    a. per [facility] notes, seen on event monitor in 2014.  . Pulmonary nodules     Patient Active Problem List   Diagnosis Date Noted  . Lumbar radiculopathy 02/16/2020  . Palpitations 11/16/2019  . Dysuria   . Acute bilateral low back pain 10/02/2019  .  Bipolar 2 disorder, major depressive episode ([place]) 09/01/2019  . Adjustment disorder with depressed mood 06/30/2019  . Encounter for therapeutic drug monitoring 12/28/2018  . Atrial fibrillation with rapid ventricular response ([place]) 11/14/2018  . Fall   . Obesity, Class III, BMI 40-49.9 (morbid obesity) ([place])   . Chronic respiratory failure with hypoxia ([place])   . Elevated troponin 10/07/2018  . PSVT (paroxysmal supraventricular tachycardia) ([place]) 10/07/2018  . Hyperlipidemia 10/07/2018  .  COPD (chronic obstructive pulmonary disease) ([place]) 10/07/2018  . CAD in native artery 10/07/2018  . Chronic pain 10/07/2018  . Acute back pain 10/07/2018  . Acute hypoxemic respiratory failure ([place]) 09/19/2018  . Acute on chronic combined systolic and diastolic CHF (congestive heart failure) ([place]) 09/19/2018  . COPD with acute exacerbation ([place]) 09/19/2018  . Dyspnea 11/30/2017  . Non-ST elevation (NSTEMI) myocardial infarction ([place])   . Hypertension 11/23/2017  . Left bundle branch block 11/23/2017  . Acute on chronic diastolic CHF (congestive heart failure) ([place]) 11/23/2017  . CAD (coronary artery disease) 11/23/2017  . Breast cancer ([place]) 11/23/2017  . Acute respiratory failure with hypoxia ([place]) 11/23/2017  . Atypical nevus 07/29/2016  . Age-related nuclear cataract, bilateral 02/12/2016  . Anxiety and depression 01/24/2016  . Arthropathy, lower leg 09/14/2013  . Tibialis tendinitis 02/22/2013  . Plantar fasciitis 01/17/2013    Past Surgical History:  Procedure Laterality Date  . CORONARY STENT INTERVENTION N/A 11/26/2017   Procedure: CORONARY STENT INTERVENTION;  Surgeon: Martinique, Peter M, MD;  Location: [facility];  Service: Cardiovascular;  Laterality: N/A;  . CORONARY STENT PLACEMENT    . LEFT HEART CATH AND CORONARY ANGIOGRAPHY N/A 11/26/2017   Procedure: LEFT HEART CATH AND CORONARY ANGIOGRAPHY;  Surgeon: Martinique, Peter M, MD;  Location: [facility];  Service: Cardiovascular;  Laterality: N/A;     OB History   No obstetric history on file.     Family History  Problem Relation Age of Onset  . CAD Father 45  . Heart attack Father   . COPD Sister   . CAD Paternal Grandmother   . Sudden Cardiac Death Neg Hx     Social History   Tobacco Use  . Smoking status: Former Smoker    Packs/day: 2.00    Years: 18.00    Pack years: 36.00    Types: Cigarettes    Quit date: 12/01/1980    Years since quitting: 39.4  . Smokeless tobacco: Never Used    Substance Use Topics  . Alcohol use: No  . Drug use: No    Home Medications Prior to Admission medications   Medication Sig Start Date End Date Taking? Authorizing Provider  acetaminophen (TYLENOL) 500 MG tablet Take 500 mg by mouth every 6 (six) hours as needed.   Yes [provider]  albuterol (ACCUNEB) 0.63 MG/3ML nebulizer solution Take 1 ampule by nebulization every 6 (six) hours as needed for shortness of breath (summer time.).   Yes [provider]  atorvastatin (LIPITOR) 80 MG tablet Take 1 tablet (80 mg total) by mouth every morning. Patient taking differently: Take 80 mg by mouth every evening.  03/28/20  Yes BranchAlphonse Guild, MD  b complex vitamins tablet Take 1 tablet by mouth every morning.    Yes [provider]  Calcium Citrate-Vitamin D (CITRACAL MAXIMUM) 315-250 MG-UNIT TABS Take 2 tablets by mouth daily.    Yes [provider]  clopidogrel (PLAVIX) 75 MG tablet Take 1 tablet (75 mg total) by mouth daily.  03/28/20  Yes BranchAlphonse Guild, MD  DULoxetine (CYMBALTA) 30 MG capsule Take 30 mg by mouth daily.   Yes [provider]  DULoxetine (CYMBALTA) 60 MG capsule Take 1 capsule by mouth daily. 08/01/19  Yes [provider]  letrozole (FEMARA) 2.5 MG tablet Take 2.5 mg by mouth at bedtime.    Yes [provider]  lidocaine-prilocaine (EMLA) cream Apply 1 application topically daily as needed (knee pain).  06/28/19  Yes [provider]  losartan (COZAAR) 100 MG tablet Take 1 tablet (100 mg total) by mouth daily. 03/25/19  Yes BranchAlphonse Guild, MD  metoprolol succinate (TOPROL-XL) 100 MG 24 hr tablet TAKE 1 AND 1/2 TABLETS DAILY 03/28/20  Yes Branch, Alphonse Guild, MD  Multiple Vitamins-Minerals (EQ VISION FORMULA 50+ PO) Take 1 tablet by mouth every morning.   Yes [provider]  Multiple Vitamins-Minerals (HAIR/SKIN/NAILS) CAPS Take 1 capsule by mouth every morning.   Yes [provider]   nitroGLYCERIN (NITROSTAT) 0.4 MG SL tablet Place 1 tablet (0.4 mg total) under the tongue every 5 (five) minutes x 3 doses as needed for chest pain (if no relief after 3rd dose, proceed to the ED or call 911). 03/22/20  Yes Branch, Alphonse Guild, MD  oxyCODONE (OXY IR/ROXICODONE) 5 MG immediate release tablet Take 5 mg by mouth every 6 (six) hours as needed.  06/15/19  Yes [provider]  Potassium 99 MG TABS Take 2 tablets by mouth every morning.    Yes [provider]  pregabalin (LYRICA) 100 MG capsule Take 1 capsule by mouth at bedtime.  08/01/19  Yes [provider]  spironolactone (ALDACTONE) 25 MG tablet TAKE 1/2 TABLET(12.5 MG) BY MOUTH DAILY 03/28/20  Yes Branch, Alphonse Guild, MD  torsemide (DEMADEX) 20 MG tablet Take 10 mg by mouth daily.   Yes [provider]  Ubiquinol (QUNOL COQ10/UBIQUINOL/MEGA) 100 MG CAPS Take 1 capsule by mouth every morning. QUNOL-Ultra COQ10   Yes [provider]  warfarin (COUMADIN) 1 MG tablet Take 2 tablets daily or as directed by [facility] 04/09/20  Yes Branch, Alphonse Guild, MD    Allergies    Brilinta [ticagrelor] and Albuterol  Review of Systems   Review of Systems  Constitutional: Negative for chills and fever.  HENT: Negative.   Respiratory: Negative for shortness of breath.   Cardiovascular: Negative for chest pain and leg swelling.  Gastrointestinal: Positive for abdominal pain. Negative for abdominal distention, blood in stool, constipation, diarrhea, nausea and vomiting.  Genitourinary: Negative for decreased urine volume, difficulty urinating, dysuria, flank pain, frequency and urgency.       Reports chronic urge incontinence, not new or worsened.   Musculoskeletal: Positive for back pain. Negative for gait problem and joint swelling.  Skin: Negative for rash and wound.  Neurological: Negative for weakness and numbness.    Physical Exam Updated Vital Signs BP (!) 119/53   Pulse (!) 57   Temp 97.6  F (36.4 C) (Oral)   Resp (!) 21   Ht 5' (1.524 m)   Wt 96.6 kg   SpO2 (!) 88%   BMI 41.60 kg/m   Physical Exam Vitals and nursing note reviewed.  Constitutional:      Appearance: She is well-developed.  HENT:     Head: Normocephalic and atraumatic.  Eyes:     Conjunctiva/sclera: Conjunctivae normal.  Cardiovascular:     Rate and Rhythm: Normal rate and regular rhythm.     Heart sounds: Normal heart sounds.  Pulmonary:     Effort: Pulmonary effort is normal.     Breath sounds: Normal breath sounds. No wheezing.  Abdominal:     General: Abdomen is protuberant. Bowel sounds are normal.     Palpations: Abdomen is soft. There is no mass.     Tenderness: There is abdominal tenderness in the periumbilical area and left upper quadrant. There is no guarding or rebound.  Musculoskeletal:        General: Normal range of motion.     Cervical back: Normal range of motion.     Thoracic back: Normal.     Lumbar back: Tenderness and bony tenderness present. Negative right straight leg raise test and negative left straight leg raise test.     Comments: Tender to palpation mid to lower lumbar midline.  No palpable deformity.  Patient displays full range of motion of ankles without pain or weakness.  She can flex and extend her knees while supine but with severe pain in her lower back.  No pain with passive range of motion.  Skin:    General: Skin is warm and dry.  Neurological:     Mental Status: She is alert.     Sensory: Sensation is intact.     Comments: No focal neuro deficit in lower extremities.  Unable to ambulate patient secondary to pain.     ED Results / Procedures / Treatments   Labs (all labs ordered are listed, but only abnormal results are displayed) Labs Reviewed  CBC WITH DIFFERENTIAL/PLATELET - Abnormal; Notable for the following components:      Result Value   RBC 3.49 (*)    Hemoglobin 11.3 (*)    HCT 34.8 (*)    All other components within normal limits    COMPREHENSIVE METABOLIC PANEL - Abnormal; Notable for the following components:   Glucose, Bld 129 (*)    BUN 29 (*)    Calcium 8.8 (*)    Total Protein 6.0 (*)    Albumin 3.3 (*)    Total Bilirubin 1.3 (*)    All other components within normal limits  URINALYSIS, ROUTINE W REFLEX MICROSCOPIC - Abnormal; Notable for the following components:   Color, Urine AMBER (*)    APPearance HAZY (*)    Specific Gravity, Urine 1.044 (*)    All other components within normal limits  PROTIME-INR - Abnormal; Notable for the following components:   Prothrombin Time 32.4 (*)    INR 3.3 (*)    All other components within normal limits  LIPASE, BLOOD    EKG None  Radiology CT ABDOMEN PELVIS W CONTRAST  Result Date: 04/24/2020 CLINICAL DATA:  Lower abdominal and back pain for 2 weeks. Suspected diverticulitis. EXAM: CT ABDOMEN AND PELVIS WITH CONTRAST TECHNIQUE: Multidetector CT imaging of the abdomen and pelvis was performed using the standard protocol following bolus administration of intravenous contrast. CONTRAST:  196mL OMNIPAQUE IOHEXOL 300 MG/ML  SOLN COMPARISON:  Noncontrast CT on 10/06/2018 FINDINGS: Lower Chest: No acute findings. Hepatobiliary: No hepatic masses identified. Tiny sub-cm cysts noted left lobe. Gallbladder is unremarkable. No evidence of biliary ductal dilatation. Pancreas:  No mass or inflammatory changes. Spleen: Within normal limits in size and appearance. Adrenals/Urinary Tract: Normal appearance of adrenal glands and right kidney. A 1.7 cm subcapsular lesion is seen lateral interpolar region of the left kidney, increased in size since previous study. This shows internal intermediate attenuation which may be due to contrast enhancement or proteinaceous fluid. No evidence of ureteral calculi or  hydronephrosis. Unremarkable unopacified urinary bladder. Stomach/Bowel: No evidence of obstruction, inflammatory process or abnormal fluid collections. Diverticulosis is seen mainly  involving the sigmoid colon, however there is no evidence of diverticulitis. Vascular/Lymphatic: No pathologically enlarged lymph nodes. Aortic atherosclerosis noted. No abdominal aortic aneurysm. Reproductive: Prior hysterectomy noted. Adnexal regions are unremarkable in appearance. Other: A small paraumbilical ventral hernia is again seen which contains only omental fat. This shows no significant change compared to prior study. Musculoskeletal:  No suspicious bone lesions identified. IMPRESSION: 1. Colonic diverticulosis, without radiographic evidence of diverticulitis. 2. Stable small paraumbilical ventral hernia containing only omental fat. 3. 1.7 cm nonspecific lesion in left kidney shows mild increase in size, and could represent a solid neoplasm complex cyst. Abdomen MRI without and with contrast is recommended for further characterization. Aortic Atherosclerosis (ICD10-I70.0). Electronically Signed   By: Marlaine Hind M.D.   On: 04/24/2020 15:09    Procedures Procedures (including critical care time)  Medications Ordered in ED Medications  ondansetron (ZOFRAN) injection 4 mg (4 mg Intravenous Given 04/24/20 1408)  morphine 4 MG/ML injection 4 mg (4 mg Intravenous Given 04/24/20 1410)  iohexol (OMNIPAQUE) 300 MG/ML solution 100 mL (100 mLs Intravenous Contrast Given 04/24/20 1443)  oxyCODONE-acetaminophen (PERCOCET/ROXICET) 5-325 MG per tablet 1 tablet (1 tablet Oral Given 04/24/20 1637)    ED Course  I have reviewed the triage vital signs and the nursing notes.  Pertinent labs & imaging results that were available during my care of the patient were reviewed by me and considered in my medical decision making (see chart for details).    MDM Rules/Calculators/A&P                      Patient with known metastatic breast cancer to the lumbar spine with increased low back pain with radiation into abdomen.  Labs and imaging obtained today were reviewed and discussed with patient.  She does not  appear to have any worsening metastatic changes.  The the left renal mass was known based on CT imaging obtained at [facility] in April.  Patient was given IV morphine after which she had mild transient hypotension and desaturation to 88%.  However she received no improvement in her pain symptoms.  She has been treating her pain at home with oxycodone 2.5 mg every 2 hours without improvement.  She was given an oxycodone 5 mg tablet here.  Still unable to sit, let alone attempt to ambulate.  Patient was also seen by Dr. Reather Converse during this ED visit who recommends admission for better pain control.  Discussed with patient who is agreeable with this plan, she has no help with ADLs and cannot tolerate ambulation at this time.  Call placed to hospitalist for admission. Discussed with Dr. Denton Brick who accepts pt for admission.   Final Clinical Impression(s) / ED Diagnoses Final diagnoses:  Metastatic breast cancer ([place])  Lumbar pain  Generalized abdominal pain    Rx / DC Orders ED Discharge Orders    None       Landis Martins 04/24/20 1815    Elnora Morrison, MD 04/26/20 [phone]

## 2020-04-24 NOTE — ED Notes (Signed)
Attempted to ambulate patient. Client was only able to stand with rollator/walker and was unable to take any steps due to "pain is too much, I don't think I can even take a step." Pt reports pain of 10/10 in lower back upon standing. NT assisted patient to lower slowly back to sitting on the bed.

## 2020-04-24 NOTE — ED Triage Notes (Signed)
PT c/o constant lower back pain x2 weeks. Pt states today was unable to dress self due to pain.  Pt states back pain radiates across entire lower back. Denies injury.

## 2020-04-24 NOTE — Progress Notes (Signed)
Patient Stacey [name] B Teeple, female DOB:[birth year], 74 y.o. HC:[id number]  Chief Complaint  Patient presents with  . Shoulder Pain    L/hurting about a 2 on pain scale/R is hurting real bad    HPI  Stacey [name] is a 74 y.o. female who has left shoulder pain.  She had MRI which showed: IMPRESSION: 1. Severe tendinosis of the supraspinatus tendon with a large insertional interstitial tear with possible bursal surface extension anteriorly. 2. Severe tendinosis of the infraspinatus tendon. 3. Mild tendinosis of the subscapularis tendon with a partial-thickness tear peripherally. 4. Moderate tendinosis of the intra-articular portion of the long head of the biceps tendon with a possible small partial tear. I have explained the findings to her.  She said the injection did not help.  I would not recommend surgery.  She sits with her arm on an arm rest that is elevated.  She has pain in the left upper trapezius.  I have told her this makes her shoulder hurt more.  I have gone over ways to help this.  I have told her to use Biofreeze.     Body mass index is 44.14 kg/m.  ROS  Review of Systems  Constitutional: Positive for activity change.  Respiratory: Positive for cough and shortness of breath.   Cardiovascular: Positive for chest pain.  Musculoskeletal: Positive for arthralgias, back pain, gait problem and joint swelling.  All other systems reviewed and are negative.   All other systems reviewed and are negative.  The following is a summary of the past history medically, past history surgically, known current medicines, social history and family history.  This information is gathered electronically by the computer from prior information and documentation.  I review this each visit and have found including this information at this point in the chart is beneficial and informative.    Past Medical History:  Diagnosis Date  . CAD in native artery    a. DES to ramus 2005 with  late stent thrombosis 2006 tx with PTCA. 12/18 PCI/DES x1 to mRCA, EF 50-55%  . CHF (congestive heart failure) ([place])   . Chronic pain   . COPD (chronic obstructive pulmonary disease) ([place])   . Hyperlipidemia   . Hypertension   . Left bundle branch block   . Lymphedema   . Metastatic breast cancer ([place])    a. to bone.  . MI (myocardial infarction) ([place])   . Mild aortic stenosis 10/2017  . Morbid obesity ([place])   . PAF (paroxysmal atrial fibrillation) ([place])   . PSVT (paroxysmal supraventricular tachycardia) ([place])    a. per [facility] notes, seen on event monitor in 2014.  . Pulmonary nodules     Past Surgical History:  Procedure Laterality Date  . CORONARY STENT INTERVENTION N/A 11/26/2017   Procedure: CORONARY STENT INTERVENTION;  Surgeon: Martinique, Peter M, MD;  Location: [facility];  Service: Cardiovascular;  Laterality: N/A;  . CORONARY STENT PLACEMENT    . LEFT HEART CATH AND CORONARY ANGIOGRAPHY N/A 11/26/2017   Procedure: LEFT HEART CATH AND CORONARY ANGIOGRAPHY;  Surgeon: Martinique, Peter M, MD;  Location: [facility];  Service: Cardiovascular;  Laterality: N/A;    Family History  Problem Relation Age of Onset  . CAD Father 15  . Heart attack Father   . COPD Sister   . CAD Paternal Grandmother   . Sudden Cardiac Death Neg Hx     Social History Social History   Tobacco Use  . Smoking status: Former  Smoker    Packs/day: 2.00    Years: 18.00    Pack years: 36.00    Types: Cigarettes    Quit date: 12/01/1980    Years since quitting: 39.4  . Smokeless tobacco: Never Used  Substance Use Topics  . Alcohol use: No  . Drug use: No    Allergies  Allergen Reactions  . Brilinta [Ticagrelor] Other (See Comments)    Nausea and severe diarrhea- general weakness. Patient says does not want to take again  . Albuterol Other (See Comments)    Heart racing     Current Outpatient Medications  Medication Sig Dispense Refill  . acetaminophen (TYLENOL) 500 MG tablet  Take 500 mg by mouth every 6 (six) hours as needed.    Marland Kitchen atorvastatin (LIPITOR) 80 MG tablet Take 1 tablet (80 mg total) by mouth every morning. 90 tablet 2  . b complex vitamins tablet Take 1 tablet by mouth every morning.     . Calcium Citrate-Vitamin D (CITRACAL MAXIMUM) 315-250 MG-UNIT TABS Take 2 tablets by mouth daily.     . clopidogrel (PLAVIX) 75 MG tablet Take 1 tablet (75 mg total) by mouth daily. 90 tablet 2  . DULoxetine (CYMBALTA) 30 MG capsule Take 30 mg by mouth daily.    . DULoxetine (CYMBALTA) 60 MG capsule Take 1 capsule by mouth daily.    Marland Kitchen letrozole (FEMARA) 2.5 MG tablet Take 2.5 mg by mouth at bedtime.     . lidocaine-prilocaine (EMLA) cream Apply topically.    Marland Kitchen losartan (COZAAR) 100 MG tablet Take 1 tablet (100 mg total) by mouth daily. 90 tablet 3  . metoprolol succinate (TOPROL-XL) 100 MG 24 hr tablet TAKE 1 AND 1/2 TABLETS DAILY 135 tablet 2  . Multiple Vitamins-Minerals (EQ VISION FORMULA 50+ PO) Take 1 tablet by mouth every morning.    . Multiple Vitamins-Minerals (HAIR/SKIN/NAILS) CAPS Take 1 capsule by mouth every morning.    . nitroGLYCERIN (NITROSTAT) 0.4 MG SL tablet Place 1 tablet (0.4 mg total) under the tongue every 5 (five) minutes x 3 doses as needed for chest pain (if no relief after 3rd dose, proceed to the ED or call 911). 25 tablet 3  . oxyCODONE (OXY IR/ROXICODONE) 5 MG immediate release tablet Take 5 mg by mouth every 6 (six) hours as needed.     . Oxycodone HCl 10 MG TABS Take 10 mg by mouth every 4 (four) hours as needed (for pain).     . Potassium 99 MG TABS Take 2 tablets by mouth every morning.     . pregabalin (LYRICA) 100 MG capsule Take 1 capsule by mouth 3 (three) times daily.    Marland Kitchen spironolactone (ALDACTONE) 25 MG tablet TAKE 1/2 TABLET(12.5 MG) BY MOUTH DAILY 45 tablet 2  . torsemide (DEMADEX) 20 MG tablet Take 10 mg by mouth daily.    Marland Kitchen Ubiquinol (QUNOL COQ10/UBIQUINOL/MEGA) 100 MG CAPS Take 1 capsule by mouth every morning. QUNOL-Ultra  COQ10    . warfarin (COUMADIN) 1 MG tablet Take 2 tablets daily or as directed by [facility] 100 tablet 4   No current facility-administered medications for this visit.     Physical Exam  Blood pressure 138/76, pulse 77, height 5' (1.524 m), weight 226 lb (102.5 kg).  Constitutional: overall normal hygiene, normal nutrition, well developed, normal grooming, normal body habitus. Assistive device:wheelchair  Musculoskeletal: gait and station Limp none, muscle tone and strength are normal, no tremors or atrophy is present.  .  Neurological: coordination overall  normal.  Deep tendon reflex/nerve stretch intact.  Sensation normal.  Cranial nerves II-XII intact.   Skin:   Normal overall no scars, lesions, ulcers or rashes. No psoriasis.  Psychiatric: Alert and oriented x 3.  Recent memory intact, remote memory unclear.  Normal mood and affect. Well groomed.  Good eye contact.  Cardiovascular: overall no swelling, no varicosities, no edema bilaterally, normal temperatures of the legs and arms, no clubbing, cyanosis and good capillary refill.  Lymphatic: palpation is normal.  Left shoulder has painful ROM but nearly full. She is very tender over the left upper trapezius.  All other systems reviewed and are negative   The patient has been educated about the nature of the problem(s) and counseled on treatment options.  The patient appeared to understand what I have discussed and is in agreement with it.  Encounter Diagnoses  Name Primary?  . Chronic left shoulder pain Yes  . Body mass index 45.0-49.9, adult ([place])   . Morbid obesity ([place])     PLAN Call if any problems.  Precautions discussed.  Continue current medications.   Return to clinic 1 month   Electronically Signed Sanjuana Kava, MD 5/25/20219:47 AM

## 2020-04-24 NOTE — ED Notes (Signed)
Pt has chronic back pain, uses TENS unit prn.

## 2020-04-25 ENCOUNTER — Observation Stay (HOSPITAL_COMMUNITY): Payer: Medicare Other

## 2020-04-25 DIAGNOSIS — F3181 Bipolar II disorder: Secondary | ICD-10-CM | POA: Diagnosis present

## 2020-04-25 DIAGNOSIS — Z888 Allergy status to other drugs, medicaments and biological substances status: Secondary | ICD-10-CM | POA: Diagnosis not present

## 2020-04-25 DIAGNOSIS — K5730 Diverticulosis of large intestine without perforation or abscess without bleeding: Secondary | ICD-10-CM | POA: Diagnosis present

## 2020-04-25 DIAGNOSIS — Z7901 Long term (current) use of anticoagulants: Secondary | ICD-10-CM | POA: Diagnosis not present

## 2020-04-25 DIAGNOSIS — R32 Unspecified urinary incontinence: Secondary | ICD-10-CM | POA: Diagnosis present

## 2020-04-25 DIAGNOSIS — C50919 Malignant neoplasm of unspecified site of unspecified female breast: Secondary | ICD-10-CM

## 2020-04-25 DIAGNOSIS — I447 Left bundle-branch block, unspecified: Secondary | ICD-10-CM | POA: Diagnosis present

## 2020-04-25 DIAGNOSIS — F419 Anxiety disorder, unspecified: Secondary | ICD-10-CM | POA: Diagnosis present

## 2020-04-25 DIAGNOSIS — I480 Paroxysmal atrial fibrillation: Secondary | ICD-10-CM | POA: Diagnosis present

## 2020-04-25 DIAGNOSIS — J449 Chronic obstructive pulmonary disease, unspecified: Secondary | ICD-10-CM | POA: Diagnosis present

## 2020-04-25 DIAGNOSIS — Z515 Encounter for palliative care: Secondary | ICD-10-CM | POA: Diagnosis present

## 2020-04-25 DIAGNOSIS — Z6841 Body Mass Index (BMI) 40.0 and over, adult: Secondary | ICD-10-CM | POA: Diagnosis not present

## 2020-04-25 DIAGNOSIS — C7951 Secondary malignant neoplasm of bone: Secondary | ICD-10-CM

## 2020-04-25 DIAGNOSIS — I471 Supraventricular tachycardia: Secondary | ICD-10-CM | POA: Diagnosis present

## 2020-04-25 DIAGNOSIS — H532 Diplopia: Secondary | ICD-10-CM | POA: Diagnosis present

## 2020-04-25 DIAGNOSIS — M549 Dorsalgia, unspecified: Secondary | ICD-10-CM | POA: Diagnosis not present

## 2020-04-25 DIAGNOSIS — Z20822 Contact with and (suspected) exposure to covid-19: Secondary | ICD-10-CM | POA: Diagnosis present

## 2020-04-25 DIAGNOSIS — I2510 Atherosclerotic heart disease of native coronary artery without angina pectoris: Secondary | ICD-10-CM

## 2020-04-25 DIAGNOSIS — I110 Hypertensive heart disease with heart failure: Secondary | ICD-10-CM | POA: Diagnosis present

## 2020-04-25 DIAGNOSIS — G8929 Other chronic pain: Principal | ICD-10-CM

## 2020-04-25 DIAGNOSIS — I2583 Coronary atherosclerosis due to lipid rich plaque: Secondary | ICD-10-CM

## 2020-04-25 DIAGNOSIS — N2889 Other specified disorders of kidney and ureter: Secondary | ICD-10-CM | POA: Diagnosis present

## 2020-04-25 DIAGNOSIS — K439 Ventral hernia without obstruction or gangrene: Secondary | ICD-10-CM | POA: Diagnosis present

## 2020-04-25 DIAGNOSIS — I5042 Chronic combined systolic (congestive) and diastolic (congestive) heart failure: Secondary | ICD-10-CM | POA: Diagnosis present

## 2020-04-25 DIAGNOSIS — M48061 Spinal stenosis, lumbar region without neurogenic claudication: Secondary | ICD-10-CM | POA: Diagnosis present

## 2020-04-25 DIAGNOSIS — R791 Abnormal coagulation profile: Secondary | ICD-10-CM | POA: Diagnosis present

## 2020-04-25 DIAGNOSIS — M545 Low back pain: Secondary | ICD-10-CM | POA: Diagnosis present

## 2020-04-25 LAB — PROTIME-INR
INR: 3.9 — ABNORMAL HIGH (ref 0.8–1.2)
Prothrombin Time: 36.7 seconds — ABNORMAL HIGH (ref 11.4–15.2)

## 2020-04-25 MED ORDER — TRAZODONE HCL 50 MG PO TABS: 50.0000 mg | ORAL_TABLET | Freq: Every day | ORAL | Status: DC

## 2020-04-25 MED ORDER — GADOBUTROL 1 MMOL/ML IV SOLN
10.0000 mL | Freq: Once | INTRAVENOUS | Status: AC | PRN
  Administered 2020-04-25: 10 mL via INTRAVENOUS

## 2020-04-25 MED ORDER — PREGABALIN 50 MG PO CAPS
50.0000 mg | ORAL_CAPSULE | Freq: Two times a day (BID) | ORAL | Status: DC
  Administered 2020-04-26: 50 mg via ORAL
  Filled 2020-04-25: qty 1

## 2020-04-25 MED ORDER — ACETAMINOPHEN 500 MG PO TABS
1000.0000 mg | ORAL_TABLET | Freq: Three times a day (TID) | ORAL | Status: DC
  Administered 2020-04-25 – 2020-04-26 (×4): 1000 mg via ORAL
  Filled 2020-04-25 (×4): qty 2

## 2020-04-25 MED ORDER — TRAZODONE HCL 50 MG PO TABS
25.0000 mg | ORAL_TABLET | Freq: Every day | ORAL | Status: DC
  Administered 2020-04-25: 25 mg via ORAL
  Filled 2020-04-25: qty 1

## 2020-04-25 MED ORDER — HYDROMORPHONE HCL 2 MG PO TABS
1.0000 mg | ORAL_TABLET | ORAL | Status: DC | PRN
  Administered 2020-04-25 – 2020-04-26 (×4): 1 mg via ORAL
  Filled 2020-04-25 (×4): qty 1

## 2020-04-25 MED ORDER — LORAZEPAM 2 MG/ML IJ SOLN
0.5000 mg | Freq: Once | INTRAMUSCULAR | Status: AC | PRN
  Administered 2020-04-25: 0.5 mg via INTRAVENOUS
  Filled 2020-04-25: qty 1

## 2020-04-25 MED ORDER — CALCITONIN (SALMON) 200 UNIT/ACT NA SOLN
1.0000 | Freq: Every day | NASAL | Status: DC
  Administered 2020-04-25 – 2020-04-26 (×2): 1 via NASAL
  Filled 2020-04-25: qty 3.7

## 2020-04-25 NOTE — Progress Notes (Addendum)
PROGRESS NOTE   Stacey [name]  [id number] DOB: 10-27-1946 DOA: 04/24/2020 PCP: Patient, No Pcp Per   No chief complaint on file.   Brief Admission History:   74 y.o. female with medical history significant for systolic and diastolic CHF, COPD, atrial fibrillation, metastatic breast cancer, hypertension, left bundle branch block, coronary artery disease. Patient presented to the ED with reports of worsening low back pain over the past 2 weeks. Patient has baseline chronic back pain.  But she reports this has worsened significantly over the past 2 weeks, radiating from her back anteriorly towards her abdomen and right groin.  She is unable to walk due to pain, which she has no weakness of her extremities.  No abnormal sensation.  She has chronic urinary incontinence for which she wears depends, no bowel changes.  She denies falls.   She had sacral iliac joint injections by [facility] pain medicine clinic last injection - 03/16/20, the first helped but the second did not help.  Home pain medications are no longer helping.  Patient follows with an oncologist and radiation oncologist at [facility].  Also followed by [facility] palliative care and pain medicine clinic.  Assessment & Plan:   Principal Problem:   Intractable back pain Active Problems:   CAD (coronary artery disease)   Metastatic breast cancer (HCC)   Chronic pain   Anxiety and depression   Bipolar 2 disorder, major depressive episode ([place])   1. Intractable back pain - Pt has been treated aggressively with radiation oncology and they have told her that there is nothing further that they can offer and they have recommended that she be seen by the pain management clinic which she has been followed by them.  The patient is not happy with that she would like to discuss other alternatives and ask for the palliative medicine team to meet with her today.  REpeat MRI ordered by palliative team.   2. Metastatic breast cancer - Pt is followed closely  by oncology at [facility].  Most recent imaging showed stable disease.   3. 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.  She had been referred to urology for ongoing surveillance.   4. COPD - stable currently.  5. Essential hypertension - stable, resumed her home meds.  6. CAD - stable on home meds.   7. PAF - stable, rate controlled, fully anticoagulated with warfarin  DVT prophylaxis: warfarin  Code Status: Full  Family Communication: updated bedside  Disposition:   Status is: Observation  The patient remains OBS appropriate and will d/c before 2 midnights.  Dispo: The patient is from: Home              Anticipated d/c is to: Home              Anticipated d/c date is: 1 day              Patient currently is not medically stable to d/c.    Consultants:   Palliative medicine   Procedures:     Antimicrobials:     Subjective: Pt reports that her pain is much better controlled on IV dilaudid and she was able to sleep better.   Objective: Vitals:   04/25/20 0054 04/25/20 0506 04/25/20 0900 04/25/20 1407  BP: (!) 116/47 117/64 (!) 141/52 97/75  Pulse: 62 65 69 (!) 56  Resp: 20 20 18 18   Temp: 98.2 F (36.8 C) 98 F (36.7 C) 98  F (36.7 C) 98.1 F (36.7 C)  TempSrc: Oral Oral Oral Oral  SpO2: 98% 96% 99% 90%  Weight:      Height:        Intake/Output Summary (Last 24 hours) at 04/25/2020 1420 Last data filed at 04/25/2020 0900 Gross per 24 hour  Intake 240 ml  Output --  Net 240 ml   Filed Weights   04/24/20 1110 04/24/20 2047  Weight: 96.6 kg 97.2 kg    Examination:  General exam: Appears calm and comfortable  Respiratory system: Clear to auscultation. Respiratory effort normal. Cardiovascular system: S1 & S2 heard, RRR. No JVD, murmurs, rubs, gallops or clicks. No pedal edema. Gastrointestinal system: Abdomen is nondistended, soft and nontender. No organomegaly or masses felt. Normal bowel sounds heard. Central nervous system:  Alert and oriented. No focal neurological deficits. Extremities: Symmetric 5 x 5 power. Skin: No rashes, lesions or ulcers Psychiatry: Judgement and insight appear normal. Mood & affect appropriate.   Data Reviewed: I have personally reviewed following labs and imaging studies  CBC: Recent Labs  Lab 04/24/20 1330  WBC 5.0  NEUTROABS 3.3  HGB 11.3*  HCT 34.8*  MCV 99.7  PLT 99991111    Basic Metabolic Panel: Recent Labs  Lab 04/24/20 1330  NA 140  K 4.2  CL 102  CO2 30  GLUCOSE 129*  BUN 29*  CREATININE 0.73  CALCIUM 8.8*    GFR: Estimated Creatinine Clearance: 65.5 mL/min (by C-G formula based on SCr of 0.73 mg/dL).  Liver Function Tests: Recent Labs  Lab 04/24/20 1330  AST 23  ALT 17  ALKPHOS 44  BILITOT 1.3*  PROT 6.0*  ALBUMIN 3.3*    CBG: No results for input(s): GLUCAP in the last 168 hours.  Recent Results (from the past 240 hour(s))  SARS Coronavirus 2 by RT PCR (hospital order, performed in [facility] hospital lab) Nasopharyngeal Nasopharyngeal Swab     Status: None   Collection Time: 04/24/20  6:40 PM   Specimen: Nasopharyngeal Swab  Result Value Ref Range Status   SARS Coronavirus 2 NEGATIVE NEGATIVE Final    Comment: (NOTE) SARS-CoV-2 target nucleic acids are NOT DETECTED. The SARS-CoV-2 RNA is generally detectable in upper and lower respiratory specimens during the acute phase of infection. The lowest concentration of SARS-CoV-2 viral copies this assay can detect is 250 copies / mL. A negative result does not preclude SARS-CoV-2 infection and should not be used as the sole basis for treatment or other patient management decisions.  A negative result may occur with improper specimen collection / handling, submission of specimen other than nasopharyngeal swab, presence of viral mutation(s) within the areas targeted by this assay, and inadequate number of viral copies (<250 copies / mL). A negative result must be combined with  clinical observations, patient history, and epidemiological information. Fact Sheet for Patients:   StrictlyIdeas.no Fact Sheet for Healthcare Providers: BankingDealers.co.za This test is not yet approved or cleared  by the [place] FDA and has been authorized for detection and/or diagnosis of SARS-CoV-2 by FDA under an Emergency Use Authorization (EUA).  This EUA will remain in effect (meaning this test can be used) for the duration of the COVID-19 declaration under Section 564(b)(1) of the Act, 21 U.S.C. section 360bbb-3(b)(1), unless the authorization is terminated or revoked sooner. Performed at [facility], [address], [place], [place] [address]      Radiology Studies: CT ABDOMEN PELVIS W CONTRAST  Result Date: 04/24/2020 CLINICAL DATA:  Lower abdominal  and back pain for 2 weeks. Suspected diverticulitis. EXAM: CT ABDOMEN AND PELVIS WITH CONTRAST TECHNIQUE: Multidetector CT imaging of the abdomen and pelvis was performed using the standard protocol following bolus administration of intravenous contrast. CONTRAST:  117mL OMNIPAQUE IOHEXOL 300 MG/ML  SOLN COMPARISON:  Noncontrast CT on 10/06/2018 FINDINGS: Lower Chest: No acute findings. Hepatobiliary: No hepatic masses identified. Tiny sub-cm cysts noted left lobe. Gallbladder is unremarkable. No evidence of biliary ductal dilatation. Pancreas:  No mass or inflammatory changes. Spleen: Within normal limits in size and appearance. Adrenals/Urinary Tract: Normal appearance of adrenal glands and right kidney. A 1.7 cm subcapsular lesion is seen lateral interpolar region of the left kidney, increased in size since previous study. This shows internal intermediate attenuation which may be due to contrast enhancement or proteinaceous fluid. No evidence of ureteral calculi or hydronephrosis. Unremarkable unopacified urinary bladder. Stomach/Bowel: No evidence of obstruction, inflammatory process  or abnormal fluid collections. Diverticulosis is seen mainly involving the sigmoid colon, however there is no evidence of diverticulitis. Vascular/Lymphatic: No pathologically enlarged lymph nodes. Aortic atherosclerosis noted. No abdominal aortic aneurysm. Reproductive: Prior hysterectomy noted. Adnexal regions are unremarkable in appearance. Other: A small paraumbilical ventral hernia is again seen which contains only omental fat. This shows no significant change compared to prior study. Musculoskeletal:  No suspicious bone lesions identified. IMPRESSION: 1. Colonic diverticulosis, without radiographic evidence of diverticulitis. 2. Stable small paraumbilical ventral hernia containing only omental fat. 3. 1.7 cm nonspecific lesion in left kidney shows mild increase in size, and could represent a solid neoplasm complex cyst. Abdomen MRI without and with contrast is recommended for further characterization. Aortic Atherosclerosis (ICD10-I70.0). Electronically Signed   By: Marlaine Hind M.D.   On: 04/24/2020 15:09   Scheduled Meds: . acetaminophen  1,000 mg Oral Q8H  . atorvastatin  80 mg Oral q morning - 10a  . calcitonin (salmon)  1 spray Alternating Nares Daily  . clopidogrel  75 mg Oral Daily  . DULoxetine  30 mg Oral Daily  . DULoxetine  60 mg Oral Daily  . letrozole  2.5 mg Oral QHS  . losartan  100 mg Oral Daily  . methocarbamol  750 mg Oral TID  . metoprolol succinate  150 mg Oral Daily  . polyethylene glycol  17 g Oral Daily  . pregabalin  100 mg Oral QHS  . spironolactone  12.5 mg Oral Daily  . torsemide  10 mg Oral Daily  . traZODone  50 mg Oral QHS  . Warfarin - Pharmacist Dosing Inpatient   Does not apply q1600   Continuous Infusions:   LOS: 0 days   Time spent: 28 mins   Stacey Engh Wynetta Emery, MD How to contact the [facility] Attending or Consulting provider [place] or covering provider during after hours [place], for this patient?  1. Check the care team in [facility] and look for a)  attending/consulting [facility] provider listed and b) the [facility] team listed 2. Log into [email or web address] and use [place]'s universal password to access. If you do not have the password, please contact the hospital operator. 3. Locate the [facility] provider you are looking for under [facility] and page to a number that you can be directly reached. 4. If you still have difficulty reaching the provider, please page the [facility] (Director on Call) for the Hospitalists listed on amion for assistance.  04/25/2020, 2:20 PM

## 2020-04-25 NOTE — Progress Notes (Signed)
ANTICOAGULATION CONSULT NOTE -   Pharmacy Consult for warfarin Indication: atrial fibrillation  Allergies  Allergen Reactions  . Brilinta [Ticagrelor] Other (See Comments)    Nausea and severe diarrhea- general weakness. Patient says does not want to take again  . Albuterol Other (See Comments)    Heart racing     Patient Measurements: Height: 5' (152.4 cm) Weight: 97.2 kg (214 lb 4.6 oz) IBW/kg (Calculated) : 45.5 Heparin Dosing Weight: 69 kg  Vital Signs: Temp: 98 F (36.7 C) (05/26 0900) Temp Source: Oral (05/26 0900) BP: 141/52 (05/26 0900) Pulse Rate: 69 (05/26 0900)  Labs: Recent Labs    04/24/20 1330 04/25/20 0624  HGB 11.3*  --   HCT 34.8*  --   PLT 189  --   LABPROT 32.4* 36.7*  INR 3.3* 3.9*  CREATININE 0.73  --     Estimated Creatinine Clearance: 65.5 mL/min (by C-G formula based on SCr of 0.73 mg/dL).   Medical History: Past Medical History:  Diagnosis Date  . CAD in native artery    a. DES to ramus 2005 with late stent thrombosis 2006 tx with PTCA. 12/18 PCI/DES x1 to mRCA, EF 50-55%  . CHF (congestive heart failure) ([place])   . Chronic pain   . COPD (chronic obstructive pulmonary disease) ([place])   . Hyperlipidemia   . Hypertension   . Left bundle branch block   . Lymphedema   . Metastatic breast cancer ([place])    a. to bone.  . MI (myocardial infarction) ([place])   . Mild aortic stenosis 10/2017  . Morbid obesity ([place])   . PAF (paroxysmal atrial fibrillation) ([place])   . PSVT (paroxysmal supraventricular tachycardia) ([place])    a. per [facility] notes, seen on event monitor in 2014.  . Pulmonary nodules     Medications:  Scheduled:  . atorvastatin  80 mg Oral q morning - 10a  . clopidogrel  75 mg Oral Daily  . DULoxetine  30 mg Oral Daily  . DULoxetine  60 mg Oral Daily  . letrozole  2.5 mg Oral QHS  . losartan  100 mg Oral Daily  . methocarbamol  750 mg Oral TID  . metoprolol succinate  150 mg Oral Daily  . polyethylene glycol  17 g Oral Daily   . pregabalin  100 mg Oral QHS  . spironolactone  12.5 mg Oral Daily  . torsemide  10 mg Oral Daily  . 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 On warfarin PTA for hx Afib - LD 5/25.   INR is supratherpeutic at 3.9. Hgb 11.3, plt 189. No s/sx of bleeding.   PTA regimen is 2 mg daily.   Goal of Therapy:  INR 2-3 Monitor platelets by anticoagulation protocol: Yes   Plan:  Hold warfarin x 1 dose Monitor daily INR and s/s of bleeding.   Margot Ables, PharmD Clinical Pharmacist 04/25/2020 11:04 AM

## 2020-04-25 NOTE — Evaluation (Signed)
Physical Therapy Evaluation Patient Details Name: Stacey [name] MRN: [id number] DOB: Dec 28, 1945 Today's Date: 04/25/2020   History of Present Illness  Stacey [name] is a 74 y.o. female with medical history significant for systolic and diastolic CHF, COPD, atrial fibrillation, metastatic breast cancer, hypertension, left bundle branch block, coronary artery disease.Patient presented to the ED with reports of worsening low back pain over the past 2 weeks.  Patient has baseline chronic back pain.  But she reports this has worsened significantly over the past 2 weeks, radiating from her back anteriorly towards her abdomen and right groin.  She is unable to walk due to pain, which she has no weakness of her extremities.  No abnormal sensation.  She has chronic urinary incontinence for which she wears depends, no bowel changes.  She denies falls.  She had sacral iliac joint injections by [facility] last injection - 03/16/20, the first helped but the second did not help.  Home pain medications are no longer helping.  She has tried several topical treatments without improvement also tried duloxetine and pregabalin.She denies vomiting or loose stools, no difficulty breathing no cough, no fever or chills.    Clinical Impression  Patient functioning near baseline for functional mobility and gait, limited mostly due to c/o severe back pain with movement, able to transfer from bed to Rollator to commode in bathroom and later to chair with Min hand held assist and leaning on rollator and armrest of chair.  Patient tolerated sitting up in chair after therapy - RN notified.  Patient will benefit from continued physical therapy in hospital and recommended venue below to increase strength, balance, endurance for safe ADLs and gait.      Follow Up Recommendations Home health PT;Supervision for mobility/OOB;Supervision - Intermittent    Equipment Recommendations  None recommended by PT     Recommendations for Other Services       Precautions / Restrictions Precautions Precautions: Fall Restrictions Weight Bearing Restrictions: No      Mobility  Bed Mobility Overal bed mobility: Needs Assistance Bed Mobility: Supine to Sit     Supine to sit: Min assist;Mod assist     General bed mobility comments: slow labored movement with increased low back pain  Transfers Overall transfer level: Needs assistance Equipment used: Rolling walker (2 wheeled);1 person hand held assist Transfers: Sit to/from [facility] Sit to Stand: Min assist Stand pivot transfers: Min assist       General transfer comment: increased time, labored movement for transferring to Rollator, commode in bathroom and chair  Ambulation/Gait Ambulation/Gait assistance: Mod assist Gait Distance (Feet): 3 Feet Assistive device: 1 person hand held assist Gait Pattern/deviations: Decreased step length - left;Decreased step length - right;Decreased stride length Gait velocity: slow   General Gait Details: limited to 3-4 steps with hand held assistance during transfers to commode and chair  Stairs            Wheelchair Mobility    Modified Rankin (Stroke Patients Only)       Balance Overall balance assessment: Needs assistance Sitting-balance support: Feet supported;No upper extremity supported Sitting balance-Leahy Scale: Fair Sitting balance - Comments: fair/good seated at EOB and on commode in bathroom   Standing balance support: During functional activity;Bilateral upper extremity supported Standing balance-Leahy Scale: Poor Standing balance comment: fair/poor with hand held assist and leaning on chair/rollator/grab bar  Pertinent Vitals/Pain Pain Assessment: Faces Faces Pain Scale: Hurts whole lot Pain Location: low back Pain Descriptors / Indicators: Aching;Sore;Grimacing;Guarding Pain Intervention(s): Limited activity  within patient's tolerance;Monitored during session;Repositioned    Home Living Family/patient expects to be discharged to:: Private residence Living Arrangements: Alone Available Help at Discharge: Family;Available PRN/intermittently Type of Home: House Home Access: Ramped entrance;Stairs to enter Entrance Stairs-Rails: None Entrance Stairs-Number of Steps: 1 step into doorway after going up ramp Home Layout: One level Home Equipment: Walker - 4 wheels;Walker - 2 wheels;Bedside commode;Shower seat;Cane - single point Additional Comments: Patient's sone visits her daily    Prior Function Level of Independence: Needs assistance   Gait / Transfers Assistance Needed: short distanced household ambulator using Rollator, assisted by her son for transfers  ADL's / Homemaking Assistance Needed: assisted by family        Hand Dominance        Extremity/Trunk Assessment   Upper Extremity Assessment Upper Extremity Assessment: Overall WFL for tasks assessed    Lower Extremity Assessment Lower Extremity Assessment: Generalized weakness    Cervical / Trunk Assessment Cervical / Trunk Assessment: Normal  Communication   Communication: No difficulties  Cognition Arousal/Alertness: Awake/alert Behavior During Therapy: WFL for tasks assessed/performed Overall Cognitive Status: Within Functional Limits for tasks assessed                                        General Comments      Exercises     Assessment/Plan    PT Assessment Patient needs continued PT services  PT Problem List Decreased strength;Decreased activity tolerance;Decreased balance;Decreased mobility       PT Treatment Interventions Gait training;Functional mobility training;Therapeutic activities;Therapeutic exercise;Stair training;Patient/family education    PT Goals (Current goals can be found in the Care Plan section)  Acute Rehab PT Goals Patient Stated Goal: return home with family to  assist PT Goal Formulation: With patient Time For Goal Achievement: 04/28/20 Potential to Achieve Goals: Good    Frequency Min 3X/week   Barriers to discharge        Co-evaluation               AM-PAC PT "6 Clicks" Mobility  Outcome Measure Help needed turning from your back to your side while in a flat bed without using bedrails?: A Little Help needed moving from lying on your back to sitting on the side of a flat bed without using bedrails?: A Lot Help needed moving to and from a bed to a chair (including a wheelchair)?: A Lot Help needed standing up from a chair using your arms (e.g., wheelchair or bedside chair)?: A Little Help needed to walk in hospital room?: A Lot Help needed climbing 3-5 steps with a railing? : A Lot 6 Click Score: 14    End of Session   Activity Tolerance: Patient tolerated treatment well;Patient limited by fatigue Patient left: in chair;with call bell/phone within reach Nurse Communication: Mobility status PT Visit Diagnosis: Unsteadiness on feet (R26.81);Other abnormalities of gait and mobility (R26.89);Muscle weakness (generalized) (M62.81)    Time: [id number] PT Time Calculation (min) (ACUTE ONLY): 32 min   Charges:   PT Evaluation $PT Eval Moderate Complexity: 1 Mod PT Treatments $Therapeutic Activity: 23-37 mins        3:29 PM, 04/25/20 Lonell Grandchild, MPT Physical Therapist with [facility] [phone] office [phone] mobile phone

## 2020-04-25 NOTE — Plan of Care (Signed)
  Problem: Acute Rehab PT Goals(only PT should resolve) Goal: Pt Will Go Supine/Side To Sit Outcome: Progressing Flowsheets (Taken 04/25/2020 1531) Pt will go Supine/Side to Sit:  with min guard assist  with minimal assist Goal: Patient Will Transfer Sit To/From Stand Outcome: Progressing Flowsheets (Taken 04/25/2020 1531) Patient will transfer sit to/from stand:  with minimal assist  with min guard assist Goal: Pt Will Transfer Bed To Chair/Chair To Bed Outcome: Progressing Flowsheets (Taken 04/25/2020 1531) Pt will Transfer Bed to Chair/Chair to Bed:  min guard assist  with min assist Goal: Pt Will Ambulate Outcome: Progressing Flowsheets (Taken 04/25/2020 1531) Pt will Ambulate:  15 feet  with minimal assist  with rolling walker   3:31 PM, 04/25/20 Lonell Grandchild, MPT Physical Therapist with [facility] [phone] office [phone] mobile phone

## 2020-04-25 NOTE — Consult Note (Signed)
Consultation Note Date: 04/25/2020   Patient Name: Stacey [name]  DOB: 1946/10/09  MRN: [id number]  Age / Sex: 74 y.o., female  PCP: Stacey [name] Referring Physician: Murlean Iba, MD  Reason for Consultation: Pain control  HPI/Patient Profile: 74 y.o. 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 Palliative medicine consulted for intractable back pain from metastatic breast cancer.    Clinical Assessment and Goals of Care:  I have reviewed medical records including EPIC notes, labs and imaging, examined the patient and met at bedside with the patient to discuss her pain management.   Stacey [name] is familiar with Palliative medicine. She is followed by Stacey Hilding, PA at [facility] and also sees pain management Stacey [name] at [facility] for injections. She notes that she missed her last appointment with Stacey [name]- she had relief with her first injection, but did not find relief with her last two injections.   "Stacey [name]" shares that her goal is to be "fixed". She believes that someone, somewhere can fix her. I asked her understanding of what is causing her pain. She tells me it is tendonitis. I reviewed with her that she has had cancer in her bones and she tells me that the cancer is gone. We reviewed her scans and notes together- and it is true her bone lesions have been treated with radiation therapy- there is not clear evidence of recurrence or progression. However, I discussed with her there is likely residual damage from radiation that may not be able to be  fixed, and rather goals  may need to change to a point to be able to manage her pain. Stacey [name] remains hopeful that there exists someone who can "fix" her. I offered to discuss what I could do for her in the interim.  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   I reviewed [name]'s current pain and therapies. She has pain in her low back that radiates to her left and right buttocks, groin and down her legs. The pain in R low back she describes as burning, sharp, and feeling like her bones are going to pierce through her skin. She can't get away from the pain. It is constant. No comfortable positions except for lying down on her back. Relieved here in the hospital with IV dilaudid- was having some relief at home with prn oxycodone 34m TID, however, over the last 2 weeks her pain had grown exponentially and no longer having relief.  She has not been sleeping. By her estimate she gets about three hours of sleep.  She has intermittent issues with constipation.  She utilizes Miralax when she feels she is getting constipated. She does not want to use a daily stool softener because she feels her diet keeps her regular. At this point- I am uncertain as to the origin of [name]'s increasing pain. There is concern for worsening tumor with this sudden increase of pain. I would also be concerned for spinal compression given her increase in weakness and her onset of decrease in ability to walk.    Primary Decision Maker PATIENT    SUMMARY OF RECOMMENDATIONS -I called and discussed [name]'s case with her Palliative provider at [facility] order repeat lumbar MRI- MRI in early May was concerning for possible progression but was unclear -Will add 69m trazadone nightly po for sleep -Calcitonin [place] nasal spray 1 spray alternating nares daily for bone pain  -Modify pregabalin doseage from 1038mnightly to 5060mID -D/C IV hydromorphone -Rotate opioid-  was previously on 5mg42mycodone every 6 hours prn- will rotated to 1mg 25mromorphone q4hr prn po.  -If there is progression of tumor will start low dose decadron     Code Status/Advance Care Planning:  Full code  Palliative Prophylaxis:   Frequent Pain Assessment  Prognosis:    Unable to determine  Discharge Planning: To Be Determined  Primary Diagnoses: Present on Admission: . Intractable back pain   I have reviewed the medical record, interviewed the patient and family, and examined the patient. The following aspects are pertinent.  Past Medical History:  Diagnosis Date  . CAD in native artery    a. DES to ramus 2005 with late stent thrombosis 2006 tx with PTCA. 12/18 PCI/DES x1 to mRCA, EF 50-55%  . CHF (congestive heart failure) (HCC) [place] Chronic pain   . COPD (chronic obstructive pulmonary disease) (HCC) [place] Hyperlipidemia   . Hypertension   . Left bundle branch block   . Lymphedema   . Metastatic breast cancer (HCC) [place]. to bone.  . MI (myocardial infarction) (HCC) [place] Mild aortic stenosis 10/2017  . Morbid obesity (HCC) [place] PAF (paroxysmal atrial fibrillation) (HCC) [place] PSVT (paroxysmal supraventricular tachycardia) (HCC) [place]. [name] notes, seen on event monitor in 2014.  . Pulmonary nodules    Social History   Socioeconomic History  . Marital status: Single    Spouse name: Not on file  . Number of children: Not on file  . Years of education: Not on file  . Highest education level: Not on file  Occupational History  . Occupation: Retired  Tobacco Use  . Smoking status: Former Smoker    Packs/day: 2.00    Years: 18.00    Pack years: 36.00    Types: Cigarettes    Quit date: 12/01/1980    Years since quitting: 39.4  . Smokeless tobacco: Never Used  Substance and Sexual Activity  . Alcohol use: No  . Drug use: No  . Sexual activity: Not on file  Other Topics Concern  . Not on file  Social History Narrative  . Not on file   Social  Determinants of Health   Financial Resource Strain:   . Difficulty of Paying Living Expenses:   Food Insecurity:   . Worried About RunniCharity fundraiserhe Last Year:   . Ran OArboriculturisthe Last Year:   Transportation Needs:   . Lack Film/video editorical):   . [place] Kitchen of Transportation (Non-Medical):   Physical  Activity:   . Days of Exercise [name] Week:   . Minutes of Exercise [name] Session:   Stress:   . Feeling of Stress :   Social Connections:   . Frequency of Communication with Friends and Family:   . Frequency of Social Gatherings with Friends and Family:   . Attends Religious Services:   . Active Member of Clubs or Organizations:   . Attends Club or Organization Meetings:   . Marital Status:    Family History  Problem Relation Age of Onset  . CAD Father 62  . Heart attack Father   . COPD Sister   . CAD Paternal Grandmother   . Sudden Cardiac Death Neg Hx    Scheduled Meds: . acetaminophen  1,000 mg Oral Q8H  . atorvastatin  80 mg Oral q morning - 10a  . clopidogrel  75 mg Oral Daily  . DULoxetine  30 mg Oral Daily  . DULoxetine  60 mg Oral Daily  . letrozole  2.5 mg Oral QHS  . losartan  100 mg Oral Daily  . methocarbamol  750 mg Oral TID  . metoprolol succinate  150 mg Oral Daily  . polyethylene glycol  17 g Oral Daily  . pregabalin  100 mg Oral QHS  . spironolactone  12.5 mg Oral Daily  . torsemide  10 mg Oral Daily  . traZODone  50 mg Oral QHS  . Warfarin - Pharmacist Dosing Inpatient   Does not apply q1600   Continuous Infusions: PRN Meds:.albuterol, HYDROmorphone (DILAUDID) injection, ondansetron **OR** ondansetron (ZOFRAN) IV Medications Prior to Admission:  Prior to Admission medications   Medication Sig Start Date End Date Taking? Authorizing Provider  acetaminophen (TYLENOL) 500 MG tablet Take 500 mg by mouth every 6 (six) hours as needed.   Yes [provider]  albuterol (ACCUNEB) 0.63 MG/3ML nebulizer solution Take 1 ampule by  nebulization every 6 (six) hours as needed for shortness of breath (summer time.).   Yes [provider]  atorvastatin (LIPITOR) 80 MG tablet Take 1 tablet (80 mg total) by mouth every morning. Patient taking differently: Take 80 mg by mouth every evening.  03/28/20  Yes Branch, Jonathan F, MD  b complex vitamins tablet Take 1 tablet by mouth every morning.    Yes [provider]  Calcium Citrate-Vitamin D (CITRACAL MAXIMUM) 315-250 MG-UNIT TABS Take 2 tablets by mouth daily.    Yes [provider]  clopidogrel (PLAVIX) 75 MG tablet Take 1 tablet (75 mg total) by mouth daily. 03/28/20  Yes Branch, Jonathan F, MD  DULoxetine (CYMBALTA) 30 MG capsule Take 30 mg by mouth daily.   Yes [provider]  DULoxetine (CYMBALTA) 60 MG capsule Take 1 capsule by mouth daily. 08/01/19  Yes [provider]  letrozole (FEMARA) 2.5 MG tablet Take 2.5 mg by mouth at bedtime.    Yes [provider]  lidocaine-prilocaine (EMLA) cream Apply 1 application topically daily as needed (knee pain).  06/28/19  Yes [provider]  losartan (COZAAR) 100 MG tablet Take 1 tablet (100 mg total) by mouth daily. 03/25/19  Yes Branch, Jonathan F, MD  metoprolol succinate (TOPROL-XL) 100 MG 24 hr tablet TAKE 1 AND 1/2 TABLETS DAILY 03/28/20  Yes Branch, Jonathan F, MD  Multiple Vitamins-Minerals (EQ VISION FORMULA 50+ PO) Take 1 tablet by mouth every morning.   Yes [provider]  Multiple Vitamins-Minerals (HAIR/SKIN/NAILS) CAPS Take 1 capsule by mouth every morning.   Yes [provider]  nitroGLYCERIN (  NITROSTAT) 0.4 MG SL tablet Place 1 tablet (0.4 mg total) under the tongue every 5 (five) minutes x 3 doses as needed for chest pain (if no relief after 3rd dose, proceed to the ED or call 911). 03/22/20  Yes Branch, Jonathan F, MD  oxyCODONE (OXY IR/ROXICODONE) 5 MG immediate release tablet Take 5 mg by mouth every 6 (six) hours as needed.  06/15/19  Yes  [provider]  Potassium 99 MG TABS Take 2 tablets by mouth every morning.    Yes [provider]  pregabalin (LYRICA) 100 MG capsule Take 1 capsule by mouth at bedtime.  08/01/19  Yes [provider]  spironolactone (ALDACTONE) 25 MG tablet TAKE 1/2 TABLET(12.5 MG) BY MOUTH DAILY 03/28/20  Yes Branch, Jonathan F, MD  torsemide (DEMADEX) 20 MG tablet Take 10 mg by mouth daily.   Yes [provider]  Ubiquinol (QUNOL COQ10/UBIQUINOL/MEGA) 100 MG CAPS Take 1 capsule by mouth every morning. QUNOL-Ultra COQ10   Yes [provider]  warfarin (COUMADIN) 1 MG tablet Take 2 tablets daily or as directed by [facility] 04/09/20  Yes Branch, Jonathan F, MD   Allergies  Allergen Reactions  . Brilinta [Ticagrelor] Other (See Comments)    Nausea and severe diarrhea- general weakness. Patient says does not want to take again  . Albuterol Other (See Comments)    Heart racing    Review of Systems  Constitutional: Positive for fatigue. Negative for activity change and appetite change.  Genitourinary: Negative for difficulty urinating.  Musculoskeletal: Positive for arthralgias, back pain and myalgias.  Neurological: Positive for weakness.  Psychiatric/Behavioral: Positive for sleep disturbance.    Physical Exam Vitals and nursing note reviewed.  Cardiovascular:     Rate and Rhythm: Normal rate.  Pulmonary:     Effort: Pulmonary effort is normal.  Skin:    General: Skin is warm and dry.  Neurological:     Mental Status: She is alert and oriented to person, place, and time.     Motor: Weakness present.     Vital Signs: BP (!) 141/52 (BP Location: Right Arm)   Pulse 69   Temp 98 F (36.7 C) (Oral)   Resp 18   Ht 5' (1.524 m)   Wt 97.2 kg   SpO2 99%   BMI 41.85 kg/m  Pain Scale: 0-10   Pain Score: 7    SpO2: SpO2: 99 % O2 Device:SpO2: 99 % O2 Flow Rate: .   IO: Intake/output summary:   Intake/Output Summary (Last 24 hours) at  04/25/2020 1322 Last data filed at 04/25/2020 0900 [place] [name] 24 hour  Intake 240 ml  Output --  Net 240 ml    LBM: Last BM Date: 04/23/20 Baseline Weight: Weight: 96.6 kg Most recent weight: Weight: 97.2 kg     Palliative Assessment/Data: PPS: 50%     Thank you for this consult. Palliative medicine will continue to follow and assist as needed.   Time In: 1300 Time Out: 1500 Time Total: 120 minutes Prolonged services: yes Greater than 50%  of this time was spent counseling and coordinating care related to the above assessment and plan.  Signed by: Kasie Mahan, AGNP-C Palliative Medicine    Please contact Palliative Medicine Team phone at [phone] for questions and concerns.  For individual provider: See Amion               

## 2020-04-26 DIAGNOSIS — F419 Anxiety disorder, unspecified: Secondary | ICD-10-CM

## 2020-04-26 DIAGNOSIS — C7951 Secondary malignant neoplasm of bone: Secondary | ICD-10-CM

## 2020-04-26 DIAGNOSIS — F329 Major depressive disorder, single episode, unspecified: Secondary | ICD-10-CM

## 2020-04-26 DIAGNOSIS — Z515 Encounter for palliative care: Secondary | ICD-10-CM

## 2020-04-26 DIAGNOSIS — M48061 Spinal stenosis, lumbar region without neurogenic claudication: Secondary | ICD-10-CM

## 2020-04-26 DIAGNOSIS — M545 Low back pain: Secondary | ICD-10-CM

## 2020-04-26 LAB — CBC
HCT: 36.9 % (ref 36.0–46.0)
Hemoglobin: 11.6 g/dL — ABNORMAL LOW (ref 12.0–15.0)
MCH: 32.7 pg (ref 26.0–34.0)
MCHC: 31.4 g/dL (ref 30.0–36.0)
MCV: 103.9 fL — ABNORMAL HIGH (ref 80.0–100.0)
Platelets: 189 10*3/uL (ref 150–400)
RBC: 3.55 MIL/uL — ABNORMAL LOW (ref 3.87–5.11)
RDW: 13.6 % (ref 11.5–15.5)
WBC: 4.6 10*3/uL (ref 4.0–10.5)
nRBC: 0 % (ref 0.0–0.2)

## 2020-04-26 LAB — BASIC METABOLIC PANEL
Anion gap: 10 (ref 5–15)
BUN: 26 mg/dL — ABNORMAL HIGH (ref 8–23)
CO2: 26 mmol/L (ref 22–32)
Calcium: 9 mg/dL (ref 8.9–10.3)
Chloride: 102 mmol/L (ref 98–111)
Creatinine, Ser: 0.81 mg/dL (ref 0.44–1.00)
GFR calc Af Amer: >60 mL/min (ref >60)
GFR calc non Af Amer: >60 mL/min (ref >60)
Glucose, Bld: 101 mg/dL — ABNORMAL HIGH (ref 70–99)
Potassium: 4.3 mmol/L (ref 3.5–5.1)
Sodium: 138 mmol/L (ref 135–145)

## 2020-04-26 LAB — PROTIME-INR
INR: 4.2 (ref 0.8–1.2)
Prothrombin Time: 39.4 seconds — ABNORMAL HIGH (ref 11.4–15.2)

## 2020-04-26 MED ORDER — HYDROMORPHONE HCL 2 MG PO TABS: 1.0000 mg | ORAL_TABLET | ORAL | 0 refills | Status: AC | PRN

## 2020-04-26 MED ORDER — CALCITONIN (SALMON) 200 UNIT/ACT NA SOLN: 1.0000 | Freq: Every day | NASAL | 0 refills | Status: DC

## 2020-04-26 MED ORDER — ACETAMINOPHEN 500 MG PO TABS: 1000.0000 mg | ORAL_TABLET | Freq: Three times a day (TID) | ORAL | 0 refills | Status: DC

## 2020-04-26 MED ORDER — METHOCARBAMOL 750 MG PO TABS: 750.0000 mg | ORAL_TABLET | Freq: Three times a day (TID) | ORAL | 0 refills | Status: AC | PRN

## 2020-04-26 MED ORDER — WARFARIN SODIUM 1 MG PO TABS: ORAL_TABLET | ORAL | 4 refills | Status: DC

## 2020-04-26 NOTE — Progress Notes (Signed)
Daily Progress Note   Patient Name: Stacey [name]       Date: 04/26/2020 DOB: 02-24-1946  Age: 74 y.o. MRN#: [id number] Attending Physician: Murlean Iba, MD Primary Care Physician: Patient, No Pcp Per Admit Date: 04/24/2020  Reason for Consultation/Follow-up: Establishing goals of care  Subjective: Patient still having pain but better than yesterday. Reviewed MRI results with her- no cancer in spine. No new changes from last MRI. Severe spinal stenosis. She was relieved that there was no cancer.  I discussed with her the complications associated with chronic neuropathic pain.  We discussed the necessity of consistent exercise and physical therapy. We discussed meditation and visualization techniques. I helped her download a chronic pain management app that offers guided meditations.  She is planning on starting physical therapy exercises that she has on a laminated sheet in the pool at the [facility].  We discussed the overall trajectory and difficulties of chronic back pain and risks of surgical interventions especially with her co-morbidities.   Length of Stay: 1  Current Medications: Scheduled Meds:  . acetaminophen  1,000 mg Oral Q8H  . atorvastatin  80 mg Oral q morning - 10a  . calcitonin (salmon)  1 spray Alternating Nares Daily  . clopidogrel  75 mg Oral Daily  . DULoxetine  30 mg Oral Daily  . DULoxetine  60 mg Oral Daily  . letrozole  2.5 mg Oral QHS  . losartan  100 mg Oral Daily  . methocarbamol  750 mg Oral TID  . metoprolol succinate  150 mg Oral Daily  . polyethylene glycol  17 g Oral Daily  . pregabalin  50 mg Oral BID  . spironolactone  12.5 mg Oral Daily  . torsemide  10 mg Oral Daily  . traZODone  25 mg Oral QHS  . Warfarin - Pharmacist Dosing Inpatient    Does not apply q1600    Continuous Infusions:   PRN Meds: albuterol, HYDROmorphone, ondansetron **OR** ondansetron (ZOFRAN) IV  Physical Exam          Vital Signs: BP (!) 124/59 (BP Location: Right Arm)   Pulse (!) 54   Temp 98.5 F (36.9 C) (Oral)   Resp 20   Ht 5' (1.524 m)   Wt 97.2 kg   SpO2 98%   BMI 41.85 kg/m  SpO2:  SpO2: 98 % O2 Device: O2 Device: Room Air O2 Flow Rate:    Intake/output summary:   Intake/Output Summary (Last 24 hours) at 04/26/2020 1409 Last data filed at 04/26/2020 0900 Gross per 24 hour  Intake 390 ml  Output 1800 ml  Net -1410 ml   LBM: Last BM Date: 04/23/20 Baseline Weight: Weight: 96.6 kg [name] recent weight: Weight: 97.2 kg       Palliative Assessment/Data: 60%      Patient Active Problem List   Diagnosis Date Noted  . Bone metastases ([place])   . Palliative care by specialist   . Intractable back pain 04/24/2020  . Lumbar radiculopathy 02/16/2020  . Palpitations 11/16/2019  . Dysuria   . Acute bilateral low back pain 10/02/2019  . Bipolar 2 disorder, major depressive episode ([place]) 09/01/2019  . Adjustment disorder with depressed mood 06/30/2019  . Encounter for therapeutic drug monitoring 12/28/2018  . Atrial fibrillation with rapid ventricular response ([place]) 11/14/2018  . Fall   . Obesity, Class III, BMI 40-49.9 (morbid obesity) ([place])   . Chronic respiratory failure with hypoxia ([place])   . Elevated troponin 10/07/2018  . PSVT (paroxysmal supraventricular tachycardia) ([place]) 10/07/2018  . Hyperlipidemia 10/07/2018  . COPD (chronic obstructive pulmonary disease) ([place]) 10/07/2018  . CAD in native artery 10/07/2018  . Chronic pain 10/07/2018  . Acute back pain 10/07/2018  . Acute hypoxemic respiratory failure ([place]) 09/19/2018  . Acute on chronic combined systolic and diastolic CHF (congestive heart failure) ([place]) 09/19/2018  . COPD with acute exacerbation ([place]) 09/19/2018  . Dyspnea 11/30/2017  . Non-ST elevation (NSTEMI)  myocardial infarction ([place])   . Hypertension 11/23/2017  . Left bundle branch block 11/23/2017  . Acute on chronic diastolic CHF (congestive heart failure) ([place]) 11/23/2017  . CAD (coronary artery disease) 11/23/2017  . Metastatic breast cancer ([place]) 11/23/2017  . Acute respiratory failure with hypoxia ([place]) 11/23/2017  . Atypical nevus 07/29/2016  . Age-related nuclear cataract, bilateral 02/12/2016  . Anxiety and depression 01/24/2016  . Arthropathy, lower leg 09/14/2013  . Tibialis tendinitis 02/22/2013  . Plantar fasciitis 01/17/2013    Palliative Care Assessment & Plan   Patient Profile:  74 y.o. 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 Palliative medicine consulted for intractable back pain from metastatic breast cancer.    Assessment/Recommendations/Plan   Pain does not appear to be cancer related- I cannot offer any further adjustments of pain medicines- recommend continue regimen as ordered  Utilize chronic pain app- helped her download [facility] Chronic pain app- encouraged use of guided meditations  Encouraged to start water therapy asap  Physical therapy  Recommend she followup with her outpatient pain management clinic for further recommendations  Goals of Care and Additional Recommendations:  Limitations on Scope of Treatment: Full Scope Treatment  Code Status:  Full code  Prognosis:   Unable to determine  Discharge Planning:  Home with [place] was discussed with patient  Thank you for allowing the Palliative Medicine Team to assist in the care of this patient.   Time In: 0930 Time Out: 1030 Total  Time 60 mins Prolonged Time Billed yes  Greater than 50%  of this time was spent counseling and coordinating care related to the above assessment and plan.  Mariana Kaufman, AGNP-C Palliative Medicine   Please contact Palliative Medicine Team phone at [phone] for questions and concerns.

## 2020-04-26 NOTE — Progress Notes (Signed)
ANTICOAGULATION CONSULT NOTE -   Pharmacy Consult for warfarin Indication: atrial fibrillation  Allergies  Allergen Reactions  . Brilinta [Ticagrelor] Other (See Comments)    Nausea and severe diarrhea- general weakness. Patient says does not want to take again  . Albuterol Other (See Comments)    Heart racing     Patient Measurements: Height: 5' (152.4 cm) Weight: 97.2 kg (214 lb 4.6 oz) IBW/kg (Calculated) : 45.5 Heparin Dosing Weight: 69 kg  Vital Signs: Temp: 98.5 F (36.9 C) (05/27 0537) Temp Source: Oral (05/27 0537) BP: 124/59 (05/27 0537) Pulse Rate: 54 (05/27 0537)  Labs: Recent Labs    04/24/20 1330 04/25/20 0624 04/26/20 0534  HGB 11.3*  --  11.6*  HCT 34.8*  --  36.9  PLT 189  --  189  LABPROT 32.4* 36.7* 39.4*  INR 3.3* 3.9* 4.2*  CREATININE 0.73  --  0.81    Estimated Creatinine Clearance: 64.6 mL/min (by C-G formula based on SCr of 0.81 mg/dL).   Medical History: Past Medical History:  Diagnosis Date  . CAD in native artery    a. DES to ramus 2005 with late stent thrombosis 2006 tx with PTCA. 12/18 PCI/DES x1 to mRCA, EF 50-55%  . CHF (congestive heart failure) ([place])   . Chronic pain   . COPD (chronic obstructive pulmonary disease) ([place])   . Hyperlipidemia   . Hypertension   . Left bundle branch block   . Lymphedema   . Metastatic breast cancer ([place])    a. to bone.  . MI (myocardial infarction) ([place])   . Mild aortic stenosis 10/2017  . Morbid obesity ([place])   . PAF (paroxysmal atrial fibrillation) ([place])   . PSVT (paroxysmal supraventricular tachycardia) ([place])    a. per [facility] notes, seen on event monitor in 2014.  . Pulmonary nodules     Medications:  Scheduled:  . acetaminophen  1,000 mg Oral Q8H  . atorvastatin  80 mg Oral q morning - 10a  . calcitonin (salmon)  1 spray Alternating Nares Daily  . clopidogrel  75 mg Oral Daily  . DULoxetine  30 mg Oral Daily  . DULoxetine  60 mg Oral Daily  . letrozole  2.5 mg Oral QHS  .  losartan  100 mg Oral Daily  . methocarbamol  750 mg Oral TID  . metoprolol succinate  150 mg Oral Daily  . polyethylene glycol  17 g Oral Daily  . pregabalin  50 mg Oral BID  . spironolactone  12.5 mg Oral Daily  . torsemide  10 mg Oral Daily  . traZODone  25 mg Oral QHS  . Warfarin - Pharmacist Dosing Inpatient   Does not apply q1600    Assessment: 55 yof presenting with worsening low back, flank, and abdominal pain- has known metastatic breast cancer to lumbar spine. On warfarin PTA for hx Afib - LD 5/25.   INR is supratherpeutic at 4.2. Hgb 11.6, plt WNL. No s/sx of bleeding.   PTA regimen is 2 mg daily.   Goal of Therapy:  INR 2-3 Monitor platelets by anticoagulation protocol: Yes   Plan:  Hold warfarin x 1 dose Monitor daily INR and s/s of bleeding.   Margot Ables, PharmD Clinical Pharmacist 04/26/2020 10:58 AM

## 2020-04-26 NOTE — Progress Notes (Signed)
Critical lab - INR 4.2  Notified MD. Elodia Florence RN

## 2020-04-26 NOTE — TOC Transition Note (Signed)
Transition of Care [facility]) - CM/SW Discharge Note   Patient Details  Name: Stacey [name] MRN: [id number] Date of Birth: [birth year]  [facility]) CM/SW Contact:  Shade Flood, LCSW Phone Number: 04/26/2020, 4:25 PM   Clinical Narrative:     Pt stable for dc today per MD. Damaris Schooner with pt to discuss dc planning. Pt will dc home where she lives alone but her son comes to help her out twice a day. Per pt, her son makes sure she has food, does her laundry, takes her to appointments and the store, and picks up her medications. Pt states that she has a walker, two canes, shower chair, BSC, and a scooter for use outside the home.   Discussed MD recommendations for [facility] RN and PT at dc. Pt agreeable. Discussed CMS provider options and pt requests [facility] stating that she has had them in the past. Referral given to [facility] at [place].  There are no other TOC needs for dc.  Expected Discharge Plan: [place] Barriers to Discharge: Barriers Resolved   Patient Goals and CMS Choice Patient states their goals for this hospitalization and ongoing recovery are:: feel better CMS Medicare.gov Compare Post Acute Care list provided to:: Patient Choice offered to / list presented to : Patient  Expected Discharge Plan and Services Expected Discharge Plan: [place] In-house Referral: Clinical Social Work   Post Acute Care Choice: [place] arrangements for the past 2 months: [place] Expected Discharge Date: 04/26/20                         HH Arranged: RN, PT [place] Agency: [place] ([place]) Date [place]: 04/26/20   Representative spoke with at [place]: Vaughan Basta  Prior Living Arrangements/Services Living arrangements for the past 2 months: [place] Lives with:: Self Patient language and need for interpreter reviewed:: Yes Do you feel safe going back to the place where you live?: Yes       Need for Family Participation in Patient Care: Yes (Comment) Care giver support system in place?: Yes (comment) Current home services: DME Criminal Activity/Legal Involvement Pertinent to Current Situation/Hospitalization: No - Comment as needed  Activities of Daily Living Home Assistive Devices/Equipment: Eyeglasses, Dentures (specify type), Walker (specify type), Cane (specify quad or straight), Oxygen, Nebulizer, Bedside commode/3-in-1, Shower chair without back, Grab bars in shower(rolling walker) ADL Screening (condition at time of admission) Patient's cognitive ability adequate to safely complete daily activities?: No Is the patient deaf or have difficulty hearing?: Yes Does the patient have difficulty seeing, even when wearing glasses/contacts?: Yes Does the patient have difficulty concentrating, remembering, or making decisions?: Yes Patient able to express need for assistance with ADLs?: Yes Does the patient have difficulty dressing or bathing?: Yes Independently performs ADLs?: Yes (appropriate for developmental age) Communication: Independent Dressing (OT): Needs assistance Is this a change from baseline?: Change from baseline, expected to last <3days Grooming: Needs assistance Is this a change from baseline?: Change from baseline, expected to last <3 days Feeding: Independent Bathing: Needs assistance Is this a change from baseline?: Change from baseline, expected to last <3 days Toileting: Needs assistance Is this a change from baseline?: Change from baseline, expected to last <3 days In/Out Bed: Needs assistance Is this a change from baseline?: Change from baseline, expected to last <3 days Walks in Home: Needs assistance Is this a change from baseline?: Change from  baseline, expected to last <3 days Does the patient have difficulty walking or climbing stairs?: Yes Weakness of Legs: Both Weakness of Arms/Hands: None  Permission Sought/Granted Permission sought to  share information with : Chartered certified accountant granted to share information with : Yes, Verbal Permission Granted     Permission granted to share info w AGENCY: [facility]        Emotional Assessment   Attitude/Demeanor/Rapport: Engaged Affect (typically observed): Pleasant Orientation: : Oriented to Self, Oriented to Place, Oriented to  Time, Oriented to Situation Alcohol / Substance Use: Not Applicable Psych Involvement: No (comment)  Admission diagnosis:  Lumbar pain [M54.5] Generalized abdominal pain [R10.84] Intractable back pain [M54.9] Metastatic breast cancer ([place]) [C50.919] Patient Active Problem List   Diagnosis Date Noted  . Spinal stenosis of lumbar region   . Bone metastases ([place])   . Palliative care by specialist   . Lumbar pain 04/24/2020  . Lumbar radiculopathy 02/16/2020  . Palpitations 11/16/2019  . Dysuria   . Acute bilateral low back pain 10/02/2019  . Bipolar 2 disorder, major depressive episode ([place]) 09/01/2019  . Adjustment disorder with depressed mood 06/30/2019  . Encounter for therapeutic drug monitoring 12/28/2018  . Atrial fibrillation with rapid ventricular response ([place]) 11/14/2018  . Fall   . Obesity, Class III, BMI 40-49.9 (morbid obesity) ([place])   . Chronic respiratory failure with hypoxia ([place])   . Elevated troponin 10/07/2018  . PSVT (paroxysmal supraventricular tachycardia) ([place]) 10/07/2018  . Hyperlipidemia 10/07/2018  . COPD (chronic obstructive pulmonary disease) ([place]) 10/07/2018  . CAD in native artery 10/07/2018  . Chronic pain 10/07/2018  . Acute back pain 10/07/2018  . Acute hypoxemic respiratory failure ([place]) 09/19/2018  . Acute on chronic combined systolic and diastolic CHF (congestive heart failure) ([place]) 09/19/2018  . COPD with acute exacerbation ([place]) 09/19/2018  . Dyspnea 11/30/2017  . Non-ST elevation (NSTEMI) myocardial infarction ([place])   . Hypertension 11/23/2017  . Left bundle branch block  11/23/2017  . Acute on chronic diastolic CHF (congestive heart failure) ([place]) 11/23/2017  . CAD (coronary artery disease) 11/23/2017  . Metastatic breast cancer ([place]) 11/23/2017  . Acute respiratory failure with hypoxia ([place]) 11/23/2017  . Atypical nevus 07/29/2016  . Age-related nuclear cataract, bilateral 02/12/2016  . Anxiety and depression 01/24/2016  . Arthropathy, lower leg 09/14/2013  . Tibialis tendinitis 02/22/2013  . Plantar fasciitis 01/17/2013   PCP:  Patient, No Pcp Per Pharmacy:   [facility] [place], [place] [address] [place] [address] Phone: [phone] Fax: [phone]  [facility] [place] [place] [address] Phone: [phone] Fax: [phone]     Social Determinants of Health (SDOH) Interventions    Readmission Risk Interventions Readmission Risk Prevention Plan 04/26/2020  Transportation Screening Complete  HRI or [place] Complete  Social Work Consult for [place] Planning/Counseling Complete  Palliative Care Screening Complete  Medication Review Press photographer) Complete     Final next level of care: Home w Home Health Services Barriers to Discharge: Barriers Resolved   Patient Goals and CMS Choice Patient states their goals for this hospitalization and ongoing recovery are:: feel better CMS Medicare.gov Compare Post Acute Care list provided to:: Patient Choice offered to / list presented to : Patient  Discharge Placement  Discharge Plan and Services In-house Referral: Clinical Social Work   Post Acute Care Choice: Home Health                    HH Arranged: RN, PT [facility] Agency: [place] ([facility]) Date [place]: 04/26/20   Representative spoke with at [place]: [place] ([place]) Interventions     Readmission Risk  Interventions Readmission Risk Prevention Plan 04/26/2020  Transportation Screening Complete  HRI or [place] Complete  Social Work Consult for [place] Planning/Counseling Complete  Palliative Care Screening Complete  Medication Review Press photographer) Complete

## 2020-04-26 NOTE — Discharge Summary (Addendum)
Physician Discharge [facility] [id number] DOB: [birth year] DOA: 04/24/2020  Madelin Rear Pain management [facility]  Admit date: 04/24/2020 Discharge date: 04/26/2020  Admitted From:  Home  Disposition: Home with [facility]  Recommendations for Outpatient Follow-up:  1. Follow up with oncologist, pain management and palliative medicine team at [place] 2. [place] RN to check PT/INR on 5/28, 5/30 and report to PCP, warfarin clinic  Home Health: PT, RN   Discharge Condition: STABLE   CODE STATUS: FULL    Brief Hospitalization Summary: Please see all hospital notes, images, labs for full details of the hospitalization. ADMISSION HPI: Stacey [name] is a 74 y.o. female with medical history significant for systolic and diastolic CHF, COPD, atrial fibrillation, metastatic breast cancer, hypertension, left bundle branch block, coronary artery disease. Patient presented to the ED with reports of worsening low back pain over the past 2 weeks.  Patient has baseline chronic back pain.  But she reports this has worsened significantly over the past 2 weeks, radiating from her back anteriorly towards her abdomen and right groin.  She is unable to walk due to pain, which she has no weakness of her extremities.  No abnormal sensation.  She has chronic urinary incontinence for which she wears depends, no bowel changes.  She denies falls.  She had sacral iliac joint injections by [facility] clinic last injection - 03/16/20, the first helped but the second did not help.  Home pain medications are no longer helping.  She has tried several topical treatments without improvement also tried duloxetine and pregabalin. She denies vomiting or loose stools, no difficulty breathing no cough, no fever or chills.  Patient follows with an oncologist at [facility].  Also with palliative care and pain medicine clinic.  ED Course: Heart rate 50s to 60s, blood  pressure 110s to 140s, O2 sats greater than 93% on room air, momentarily dropped to 88% after morphine and oxycodone given in ED.  Abdominal CT-colonic diverticulosis, stable small periumbilical ventral hernia.  Brief Admission History:  74 y.o.femalewith medical history significant forsystolic and diastolic CHF, COPD, atrial fibrillation, metastatic breast cancer, hypertension, left bundle branch block, coronary artery disease. Patient presented to the ED with reports of worsening low back pain over the past 2 weeks.Patient has baseline chronic back pain.But she reports this has worsened significantly over the past 2 weeks, radiating from her back anteriorly towards her abdomen and rightgroin. She is unable to walk due to pain, which she has no weakness of her extremities. No abnormal sensation. She has chronic urinary incontinence for which she wears depends,no bowel changes. She denies falls.  She had sacral iliac joint injections by [facility] lastinjection- 03/16/20, the first helped but the second did not help. Home pain medications are no longer helping.  Patient follows with an oncologist and radiation oncologist at [facility]. Also followed by [facility] palliative care and pain medicine clinic.  Assessment & Plan:   Principal Problem:   Intractable back pain Active Problems:   CAD (coronary artery disease)   Metastatic breast cancer    Chronic pain   Anxiety and depression   Bipolar 2 disorder, major depressive episode   1. Intractable back pain - pain has been much better controlled, she is now on oral dilaudid tablets and off IV pain meds. She has worked with PT and they recommend HHPT.  Oxycodone discontinued by palliative medicine and patient placed on oral dilaudid 1 mg every  4 hours PRN.  She seems to tolerate this regimen well.  She will be discharged on this and follow up with Dr. Sheria Lang her pain management specialist.  She had repeat MRI with findings very  similar to the MRI she had last year with severe spinal stenosis. Referred for second opinion to [place] neurosurgery. 2. Metastatic breast cancer - Pt is followed closely by oncology at [facility].  Most recent imaging showed stable disease.   3. Left Renal mass - this is not a new finding it has been followed at [facility].  She had been referred to urology for ongoing surveillance by her oncologist.   4. COPD - stable currently.  5. Essential hypertension - stable, resumed her home meds.  6. CAD - stable on home meds.   7. PAF - stable, rate controlled, fully anticoagulated with warfarin, INR is elevated at 4 today but no bleeding complications, hold warfarin for next 2 days and will have [facility] RN to check and follow INR and report to PCP and warfarin clinic as appropriate.   8. Diplopia - pt advised to make appointment with ophthalmology for examination.    DVT prophylaxis: warfarin  Code Status: Full  Family Communication: updated bedside   Discharge Diagnoses:  Principal Problem:   Lumbar pain Active Problems:   CAD (coronary artery disease)   Metastatic breast cancer (HCC)   Chronic pain   Anxiety and depression   Bipolar 2 disorder, major depressive episode (HCC)   Bone metastases ([place])   Palliative care by specialist   Spinal stenosis of lumbar region   Discharge Instructions: Discharge Instructions    Ambulatory referral to Neurosurgery   Complete by: As directed    Ambulatory referral to Ophthalmology   Complete by: As directed      Allergies as of 04/26/2020      Reactions   Brilinta [ticagrelor] Other (See Comments)   Nausea and severe diarrhea- general weakness. Patient says does not want to take again   Albuterol Other (See Comments)   Heart racing      Medication List    STOP taking these medications   oxyCODONE 5 MG immediate release tablet Commonly known as: Oxy IR/ROXICODONE     TAKE these medications   acetaminophen 500 MG tablet Commonly known as:  TYLENOL Take 2 tablets (1,000 mg total) by mouth in the morning, at noon, and at bedtime. What changed:   how much to take  when to take this  reasons to take this   albuterol 0.63 MG/3ML nebulizer solution Commonly known as: ACCUNEB Take 1 ampule by nebulization every 6 (six) hours as needed for shortness of breath (summer time.).   atorvastatin 80 MG tablet Commonly known as: LIPITOR Take 1 tablet (80 mg total) by mouth every morning. What changed: when to take this   b complex vitamins tablet Take 1 tablet by mouth every morning.   calcitonin (salmon) 200 UNIT/ACT nasal spray Commonly known as: MIACALCIN/FORTICAL Place 1 spray into alternate nostrils daily. Start taking on: Apr 27, 2020   Citracal Maximum 315-250 MG-UNIT Tabs Generic drug: Calcium Citrate-Vitamin D Take 2 tablets by mouth daily.   clopidogrel 75 MG tablet Commonly known as: PLAVIX Take 1 tablet (75 mg total) by mouth daily.   DULoxetine 30 MG capsule Commonly known as: CYMBALTA Take 30 mg by mouth daily.   DULoxetine 60 MG capsule Commonly known as: CYMBALTA Take 1 capsule by mouth daily.   EQ VISION FORMULA 50+ PO Take 1 tablet  by mouth every morning.   Hair/Skin/Nails Caps Take 1 capsule by mouth every morning.   HYDROmorphone 2 MG tablet Commonly known as: DILAUDID Take 0.5 tablets (1 mg total) by mouth every 4 (four) hours as needed for up to 7 days for moderate pain.   letrozole 2.5 MG tablet Commonly known as: FEMARA Take 2.5 mg by mouth at bedtime.   lidocaine-prilocaine cream Commonly known as: EMLA Apply 1 application topically daily as needed (knee pain).   losartan 100 MG tablet Commonly known as: COZAAR Take 1 tablet (100 mg total) by mouth daily.   methocarbamol 750 MG tablet Commonly known as: ROBAXIN Take 1 tablet (750 mg total) by mouth every 8 (eight) hours as needed for muscle spasms.   metoprolol succinate 100 MG 24 hr tablet Commonly known as:  TOPROL-XL TAKE 1 AND 1/2 TABLETS DAILY   nitroGLYCERIN 0.4 MG SL tablet Commonly known as: Nitrostat Place 1 tablet (0.4 mg total) under the tongue every 5 (five) minutes x 3 doses as needed for chest pain (if no relief after 3rd dose, proceed to the ED or call 911).   Potassium 99 MG Tabs Take 2 tablets by mouth every morning.   pregabalin 100 MG capsule Commonly known as: LYRICA Take 1 capsule by mouth at bedtime.   Qunol CoQ10/Ubiquinol/Mega 100 MG Caps Generic drug: Ubiquinol Take 1 capsule by mouth every morning. QUNOL-Ultra COQ10   spironolactone 25 MG tablet Commonly known as: ALDACTONE TAKE 1/2 TABLET(12.5 MG) BY MOUTH DAILY   torsemide 20 MG tablet Commonly known as: DEMADEX Take 10 mg by mouth daily.   warfarin 1 MG tablet Commonly known as: COUMADIN Take as directed. If you are unsure how to take this medication, talk to your nurse or doctor. Original instructions: Take 2 tablets daily or as directed by Coumadin Clinic Start taking on: Apr 29, 2020 What changed: These instructions start on Apr 29, 2020. If you are unsure what to do until then, ask your doctor or other care provider.      Follow-up Information    Edmund Hilda, MD. Schedule an appointment as soon as possible for a visit in 1 week(s).   Specialty: Anesthesiology Contact information: [place] [id number] [phone]        Darral Dash, MD. Schedule an appointment as soon as possible for a visit in 1 week(s).   Specialty: Internal Medicine Contact information: [facility] [place] [address] [phone]        Sela Hilding. Call in 1 day(s).   Specialty: Internal Medicine Contact information: [address] [place] [address] [phone]        Hortencia Pilar, MD. Call in 1 day(s).   Specialty: Surgery Why: Call opthalmologist to make appointment for any eye problems  Contact information: [place] [address] [phone]        Pa, Hoagland. Schedule an appointment as soon as possible for a visit in 1 week(s).   Specialty: Neurosurgery Why: Call for second opinion regarding back  Contact information: [address] [place] [address] [phone]          Allergies  Allergen Reactions  . Brilinta [Ticagrelor] Other (See Comments)    Nausea and severe diarrhea- general weakness. Patient says does not want to take again  . Albuterol Other (See Comments)    Heart racing    Allergies  as of 04/26/2020      Reactions   Brilinta [ticagrelor] Other (See Comments)   Nausea and severe diarrhea- general weakness. Patient says does not want to take again   Albuterol Other (See Comments)   Heart racing      Medication List    STOP taking these medications   oxyCODONE 5 MG immediate release tablet Commonly known as: Oxy IR/ROXICODONE     TAKE these medications   acetaminophen 500 MG tablet Commonly known as: TYLENOL Take 2 tablets (1,000 mg total) by mouth in the morning, at noon, and at bedtime. What changed:   how much to take  when to take this  reasons to take this   albuterol 0.63 MG/3ML nebulizer solution Commonly known as: ACCUNEB Take 1 ampule by nebulization every 6 (six) hours as needed for shortness of breath (summer time.).   atorvastatin 80 MG tablet Commonly known as: LIPITOR Take 1 tablet (80 mg total) by mouth every morning. What changed: when to take this   b complex vitamins tablet Take 1 tablet by mouth every morning.   calcitonin (salmon) 200 UNIT/ACT nasal spray Commonly known as: MIACALCIN/FORTICAL Place 1 spray into alternate nostrils daily. Start taking on: Apr 27, 2020   Citracal Maximum 315-250 MG-UNIT Tabs Generic drug: Calcium Citrate-Vitamin D Take 2 tablets by mouth daily.   clopidogrel 75 MG tablet Commonly known as: PLAVIX Take 1 tablet (75 mg  total) by mouth daily.   DULoxetine 30 MG capsule Commonly known as: CYMBALTA Take 30 mg by mouth daily.   DULoxetine 60 MG capsule Commonly known as: CYMBALTA Take 1 capsule by mouth daily.   EQ VISION FORMULA 50+ PO Take 1 tablet by mouth every morning.   Hair/Skin/Nails Caps Take 1 capsule by mouth every morning.   HYDROmorphone 2 MG tablet Commonly known as: DILAUDID Take 0.5 tablets (1 mg total) by mouth every 4 (four) hours as needed for up to 7 days for moderate pain.   letrozole 2.5 MG tablet Commonly known as: FEMARA Take 2.5 mg by mouth at bedtime.   lidocaine-prilocaine cream Commonly known as: EMLA Apply 1 application topically daily as needed (knee pain).   losartan 100 MG tablet Commonly known as: COZAAR Take 1 tablet (100 mg total) by mouth daily.   methocarbamol 750 MG tablet Commonly known as: ROBAXIN Take 1 tablet (750 mg total) by mouth every 8 (eight) hours as needed for muscle spasms.   metoprolol succinate 100 MG 24 hr tablet Commonly known as: TOPROL-XL TAKE 1 AND 1/2 TABLETS DAILY   nitroGLYCERIN 0.4 MG SL tablet Commonly known as: Nitrostat Place 1 tablet (0.4 mg total) under the tongue every 5 (five) minutes x 3 doses as needed for chest pain (if no relief after 3rd dose, proceed to the ED or call 911).   Potassium 99 MG Tabs Take 2 tablets by mouth every morning.   pregabalin 100 MG capsule Commonly known as: LYRICA Take 1 capsule by mouth at bedtime.   Qunol CoQ10/Ubiquinol/Mega 100 MG Caps Generic drug: Ubiquinol Take 1 capsule by mouth every morning. QUNOL-Ultra COQ10   spironolactone 25 MG tablet Commonly known as: ALDACTONE TAKE 1/2 TABLET(12.5 MG) BY MOUTH DAILY   torsemide 20 MG tablet Commonly known as: DEMADEX Take 10 mg by mouth daily.   warfarin 1 MG tablet Commonly known as: COUMADIN Take as directed. If you are unsure how to take this medication, talk to your nurse or doctor. Original instructions: Take 2  tablets daily  or as directed by Coumadin Clinic Start taking on: Apr 29, 2020 What changed: These instructions start on Apr 29, 2020. If you are unsure what to do until then, ask your doctor or other care provider.       Procedures/Studies: MR Lumbar Spine W Wo Contrast  Result Date: 04/25/2020 CLINICAL DATA:  Acute on chronic low back pain, worsening over 2 weeks. EXAM: MRI LUMBAR SPINE WITHOUT AND WITH CONTRAST TECHNIQUE: Multiplanar and multiecho pulse sequences of the lumbar spine were obtained without and with intravenous contrast. CONTRAST:  36mL GADAVIST GADOBUTROL 1 MMOL/ML IV SOLN COMPARISON:  CT abdomen pelvis 04/24/2020 and 07/14/2018 FINDINGS: Segmentation:  Standard. Alignment:  Grade 1 anterolisthesis at L4-5 Vertebrae: There is a sclerotic focus within the L2 vertebral body, unchanged since 07/14/2018. No acute fracture. Degenerative endplate signal changes at L1-2. No osseous metastases. Conus medullaris and cauda equina: Conus extends to the L1 level. Conus and cauda equina appear normal. Paraspinal and other soft tissues: Incompletely visualized left renal lesion. Disc levels: T11-12: Negative. T12-L1: Negative. L1-L2: Large, partially calcified disc extrusion extending superiorly to the pedicle level. Severe spinal canal stenosis. Severe right and moderate left neural foraminal stenosis. L2-L3: Right asymmetric disc bulge with moderate right and mild left facet hypertrophy. Narrowing of both lateral recesses without central spinal canal stenosis. Severe bilateral neural foraminal stenosis. L3-L4: Mild disc bulge with moderate facet hypertrophy. There is no spinal canal stenosis. Moderate bilateral neural foraminal stenosis. L4-L5: Moderate facet hypertrophy. Small amount of disc uncovering with left eccentric bulge. Disc space narrowing is greatest on the left. Left lateral recess narrowing without central spinal canal stenosis. Severe left neural foraminal stenosis. L5-S1: Normal disc  space and facet joints. There is no spinal canal stenosis. No neural foraminal stenosis. Visualized sacrum: Normal. No abnormal contrast enhancement. IMPRESSION: 1. Large, partially calcified disc extrusion at L1-L2 with severe spinal canal stenosis and severe right, moderate left neural foraminal stenosis. 2. No osseous metastatic disease. 3. Multilevel moderate-to-severe neural foraminal stenosis, worst at L1-2, L2-3 and L4-5. 4. Incompletely visualized left renal lesion, described on earlier CT. Electronically Signed   By: Ulyses Jarred M.D.   On: 04/25/2020 19:32   CT ABDOMEN PELVIS W CONTRAST  Result Date: 04/24/2020 CLINICAL DATA:  Lower abdominal and back pain for 2 weeks. Suspected diverticulitis. EXAM: CT ABDOMEN AND PELVIS WITH CONTRAST TECHNIQUE: Multidetector CT imaging of the abdomen and pelvis was performed using the standard protocol following bolus administration of intravenous contrast. CONTRAST:  192mL OMNIPAQUE IOHEXOL 300 MG/ML  SOLN COMPARISON:  Noncontrast CT on 10/06/2018 FINDINGS: Lower Chest: No acute findings. Hepatobiliary: No hepatic masses identified. Tiny sub-cm cysts noted left lobe. Gallbladder is unremarkable. No evidence of biliary ductal dilatation. Pancreas:  No mass or inflammatory changes. Spleen: Within normal limits in size and appearance. Adrenals/Urinary Tract: Normal appearance of adrenal glands and right kidney. A 1.7 cm subcapsular lesion is seen lateral interpolar region of the left kidney, increased in size since previous study. This shows internal intermediate attenuation which may be due to contrast enhancement or proteinaceous fluid. No evidence of ureteral calculi or hydronephrosis. Unremarkable unopacified urinary bladder. Stomach/Bowel: No evidence of obstruction, inflammatory process or abnormal fluid collections. Diverticulosis is seen mainly involving the sigmoid colon, however there is no evidence of diverticulitis. Vascular/Lymphatic: No pathologically  enlarged lymph nodes. Aortic atherosclerosis noted. No abdominal aortic aneurysm. Reproductive: Prior hysterectomy noted. Adnexal regions are unremarkable in appearance. Other: A small paraumbilical ventral hernia is again seen which contains only omental  fat. This shows no significant change compared to prior study. Musculoskeletal:  No suspicious bone lesions identified. IMPRESSION: 1. Colonic diverticulosis, without radiographic evidence of diverticulitis. 2. Stable small paraumbilical ventral hernia containing only omental fat. 3. 1.7 cm nonspecific lesion in left kidney shows mild increase in size, and could represent a solid neoplasm complex cyst. Abdomen MRI without and with contrast is recommended for further characterization. Aortic Atherosclerosis (ICD10-I70.0). Electronically Signed   By: Marlaine Hind M.D.   On: 04/24/2020 15:09   MR Shoulder Left Wo Contrast  Result Date: 04/20/2020 CLINICAL DATA:  Left shoulder pain. Pain for 2 months. No known injury. EXAM: MRI OF THE LEFT SHOULDER WITHOUT CONTRAST TECHNIQUE: Multiplanar, multisequence MR imaging of the shoulder was performed. No intravenous contrast was administered. COMPARISON:  None. FINDINGS: Rotator cuff: Severe tendinosis of the supraspinatus tendon with a large insertional interstitial tear with possible bursal surface extension anteriorly. Severe tendinosis of the infraspinatus tendon. Teres minor tendon is intact. Mild tendinosis of the subscapularis tendon with a partial-thickness tear peripherally. Muscles: No atrophy or fatty replacement of nor abnormal signal within, the muscles of the rotator cuff. Biceps long head: Moderate tendinosis of the intra-articular portion of the long head of the biceps tendon with a possible small partial tear. Acromioclavicular Joint: Mild arthropathy of the acromioclavicular joint. Type I acromion. Trace subacromial/subdeltoid bursal fluid. Glenohumeral Joint: Small joint effusion. Partial-thickness  cartilage loss of the glenohumeral joint. Labrum:  Superior labral degeneration. Bones:  No acute osseous abnormality. No aggressive osseous lesion. Other: No fluid collection or hematoma. IMPRESSION: 1. Severe tendinosis of the supraspinatus tendon with a large insertional interstitial tear with possible bursal surface extension anteriorly. 2. Severe tendinosis of the infraspinatus tendon. 3. Mild tendinosis of the subscapularis tendon with a partial-thickness tear peripherally. 4. Moderate tendinosis of the intra-articular portion of the long head of the biceps tendon with a possible small partial tear. Electronically Signed   By: Kathreen Devoid   On: 04/20/2020 10:06   ECHOCARDIOGRAM COMPLETE  Result Date: 04/18/2020    ECHOCARDIOGRAM REPORT   Patient Name:   Stacey [name] Date of Exam: 04/18/2020 Medical Rec #:  [id number]         Height:       60.0 in Accession #:    [id number]        Weight:       218.0 lb Date of Birth:  [birth year]          BSA:          1.936 m Patient Age:    75 years          BP:           118/82 mmHg Patient Gender: F                 HR:           67 bpm. Exam Location:  [facility] Procedure: 2D Echo, Cardiac Doppler and Color Doppler Indications:     I50.22 CHF  History:         Patient has prior history of Echocardiogram examinations, most                  recent 12/01/2018. CHF, NSTEMI and CAD, COPD and Breast cancer,                  Aortic Valve Disease, Arrythmias:LBBB and Atrial Fibrillation,  Signs/Symptoms:Shortness of Breath; Risk Factors:Hypertension,                  Former Smoker, Morbidly obese and Dyslipidemia.  Sonographer:     Jeneen Montgomery RDMS, RVT, RDCS Referring Phys:  [id number] [place] Diagnosing Phys: Kate Sable MD IMPRESSIONS  1. Left ventricular ejection fraction, by estimation, is 35 to 40%. The left ventricle has moderately decreased function. The left ventricle demonstrates global hypokinesis. Left ventricular diastolic parameters  are consistent with Grade I diastolic dysfunction (impaired relaxation). Elevated left ventricular end-diastolic pressure.  2. Right ventricular systolic function is normal. The right ventricular size is normal. There is normal pulmonary artery systolic pressure.  3. The mitral valve is degenerative. Mild mitral valve regurgitation.  4. The aortic valve is tricuspid. Aortic valve regurgitation is not visualized. Mild to moderate aortic valve stenosis. Aortic valve area, by VTI measures 1.22 cm. Aortic valve mean gradient measures 10.3 mmHg. Aortic valve Vmax measures 2.14 m/s.  5. The inferior vena cava is normal in size with greater than 50% respiratory variability, suggesting right atrial pressure of 3 mmHg. Comparison(s): Changes from prior study are noted. Echocardiogram done 12/01/18 showed an EF of 25% with mild AS and an AV Peak Grad of 18 mm Hg. FINDINGS  Left Ventricle: Left ventricular ejection fraction, by estimation, is 35 to 40%. The left ventricle has moderately decreased function. The left ventricle demonstrates global hypokinesis. The left ventricular internal cavity size was normal in size. There is no left ventricular hypertrophy. Left ventricular diastolic parameters are consistent with Grade I diastolic dysfunction (impaired relaxation). Elevated left ventricular end-diastolic pressure. Right Ventricle: The right ventricular size is normal. No increase in right ventricular wall thickness. Right ventricular systolic function is normal. There is normal pulmonary artery systolic pressure. The tricuspid regurgitant velocity is 2.14 m/s, and  with an assumed right atrial pressure of 3 mmHg, the estimated right ventricular systolic pressure is [id number] mmHg. Left Atrium: Left atrial size was normal in size. Right Atrium: Right atrial size was normal in size. Pericardium: There is no evidence of pericardial effusion. Mitral Valve: The mitral valve is degenerative in appearance. There is mild thickening of  the mitral valve leaflet(s). Mild mitral annular calcification. Mild mitral valve regurgitation. Tricuspid Valve: The tricuspid valve is grossly normal. Tricuspid valve regurgitation is mild. Aortic Valve: The aortic valve is tricuspid. Aortic valve regurgitation is not visualized. Mild to moderate aortic stenosis is present. Moderate aortic valve annular calcification. There is moderate calcification of the aortic valve. Aortic valve mean gradient measures 10.3 mmHg. Aortic valve peak gradient measures 18.4 mmHg. Aortic valve area, by VTI measures 1.22 cm. Pulmonic Valve: The pulmonic valve was grossly normal. Pulmonic valve regurgitation is not visualized. Aorta: The aortic root is normal in size and structure. Venous: The inferior vena cava is normal in size with greater than 50% respiratory variability, suggesting right atrial pressure of 3 mmHg. IAS/Shunts: No atrial level shunt detected by color flow Doppler.  LEFT VENTRICLE PLAX 2D LVIDd:         4.62 cm      Diastology LVIDs:         3.99 cm      LV e' lateral:   7.45 cm/s LV PW:         0.84 cm      LV E/e' lateral: 15.3 LV IVS:        0.77 cm      LV e' medial:    7.25  cm/s LVOT diam:     1.80 cm      LV E/e' medial:  15.7 LV SV:         62 LV SV Index:   32 LVOT Area:     2.54 cm  LV Volumes (MOD) LV vol d, MOD A2C: 101.8 ml LV vol d, MOD A4C: 104.0 ml LV vol s, MOD A2C: 49.1 ml LV vol s, MOD A4C: 66.9 ml LV SV MOD A2C:     52.7 ml LV SV MOD A4C:     104.0 ml LV SV MOD BP:      49.1 ml RIGHT VENTRICLE RV S prime:     10.80 cm/s TAPSE (M-mode): 2.2 cm LEFT ATRIUM             Index LA diam:        3.60 cm 1.86 cm/m LA Vol (A2C):   48.9 ml 25.26 ml/m LA Vol (A4C):   39.7 ml 20.50 ml/m LA Biplane Vol:         22.90 ml/m  AORTIC VALVE AV Area (Vmax):    1.18 cm AV Area (Vmean):   1.24 cm AV Area (VTI):     1.22 cm AV Vmax:           214.33 cm/s AV Vmean:          150.667 cm/s AV VTI:            0.506 m AV Peak Grad:      18.4 mmHg AV Mean Grad:       10.3 mmHg LVOT Vmax:         99.80 cm/s LVOT Vmean:        73.400 cm/s LVOT VTI:          0.242 m LVOT/AV VTI ratio: 0.48  AORTA Ao Root diam: 3.00 cm MITRAL VALVE                TRICUSPID VALVE MV Area (PHT):              TR Peak grad:   18.3 mmHg MV Decel Time: 195 msec     TR Vmax:        214.00 cm/s MR Peak grad: 11.3 mmHg MR Vmax:      168.00 cm/s   SHUNTS MV E velocity: 114.00 cm/s  Systemic VTI:  0.24 m MV A velocity: 122.00 cm/s  Systemic Diam: 1.80 cm MV E/A ratio:  0.93 Kate Sable MD Electronically signed by Kate Sable MD Signature Date/Time: 04/18/2020/11:56:12 AM    Final      Subjective: Pt has been working with PT and agreeable to HHPT.  No loss of bowel or bladder control.   Discharge Exam: Vitals:   04/26/20 0537 04/26/20 1425  BP: (!) 124/59 (!) 99/50  Pulse: (!) 54 64  Resp: 20 18  Temp: 98.5 F (36.9 C) 98.2 F (36.8 C)  SpO2: 98% 94%   Vitals:   04/25/20 1940 04/25/20 2115 04/26/20 0537 04/26/20 1425  BP:  (!) 146/47 (!) 124/59 (!) 99/50  Pulse:  (!) 59 (!) 54 64  Resp:  20 20 18   Temp:  97.9 F (36.6 C) 98.5 F (36.9 C) 98.2 F (36.8 C)  TempSrc:  Oral Oral Axillary  SpO2: 90% 96% 98% 94%  Weight:      Height:       General: Pt is alert, awake, not in acute distress Cardiovascular: RRR, S1/S2 +, no rubs, no gallops Respiratory: CTA  bilaterally, no wheezing, no rhonchi Abdominal: Soft, NT, ND, bowel sounds + Extremities: no edema, no cyanosis Neurological: nonfocal exam.     The results of significant diagnostics from this hospitalization (including imaging, microbiology, ancillary and laboratory) are listed below for reference.     Microbiology: Recent Results (from the past 240 hour(s))  SARS Coronavirus 2 by RT PCR (hospital order, performed in [facility] hospital lab) Nasopharyngeal Nasopharyngeal Swab     Status: None   Collection Time: 04/24/20  6:40 PM   Specimen: Nasopharyngeal Swab  Result Value Ref Range Status   SARS  Coronavirus 2 NEGATIVE NEGATIVE Final    Comment: (NOTE) SARS-CoV-2 target nucleic acids are NOT DETECTED. The SARS-CoV-2 RNA is generally detectable in upper and lower respiratory specimens during the acute phase of infection. The lowest concentration of SARS-CoV-2 viral copies this assay can detect is 250 copies / mL. A negative result does not preclude SARS-CoV-2 infection and should not be used as the sole basis for treatment or other patient management decisions.  A negative result may occur with improper specimen collection / handling, submission of specimen other than nasopharyngeal swab, presence of viral mutation(s) within the areas targeted by this assay, and inadequate number of viral copies (<250 copies / mL). A negative result must be combined with clinical observations, patient history, and epidemiological information. Fact Sheet for Patients:   StrictlyIdeas.no Fact Sheet for Healthcare Providers: BankingDealers.co.za This test is not yet approved or cleared  by the [place] FDA and has been authorized for detection and/or diagnosis of SARS-CoV-2 by FDA under an Emergency Use Authorization (EUA).  This EUA will remain in effect (meaning this test can be used) for the duration of the COVID-19 declaration under Section 564(b)(1) of the Act, 21 U.S.C. section 360bbb-3(b)(1), unless the authorization is terminated or revoked sooner. Performed at [facility], [address], [place], [place] [address]      Labs: BNP (last 3 results) No results for input(s): BNP in the last 8760 hours. Basic Metabolic Panel: Recent Labs  Lab 04/24/20 1330 04/26/20 0534  NA 140 138  K 4.2 4.3  CL 102 102  CO2 30 26  GLUCOSE 129* 101*  BUN 29* 26*  CREATININE 0.73 0.81  CALCIUM 8.8* 9.0   Liver Function Tests: Recent Labs  Lab 04/24/20 1330  AST 23  ALT 17  ALKPHOS 44  BILITOT 1.3*  PROT 6.0*  ALBUMIN 3.3*   Recent Labs   Lab 04/24/20 1330  LIPASE 18   No results for input(s): AMMONIA in the last 168 hours. CBC: Recent Labs  Lab 04/24/20 1330 04/26/20 0534  WBC 5.0 4.6  NEUTROABS 3.3  --   HGB 11.3* 11.6*  HCT 34.8* 36.9  MCV 99.7 103.9*  PLT 189 189   Cardiac Enzymes: No results for input(s): CKTOTAL, CKMB, CKMBINDEX, TROPONINI in the last 168 hours. BNP: Invalid input(s): POCBNP CBG: No results for input(s): GLUCAP in the last 168 hours. D-Dimer No results for input(s): DDIMER in the last 72 hours. Hgb A1c No results for input(s): HGBA1C in the last 72 hours. Lipid Profile No results for input(s): CHOL, HDL, LDLCALC, TRIG, CHOLHDL, LDLDIRECT in the last 72 hours. Thyroid function studies No results for input(s): TSH, T4TOTAL, T3FREE, THYROIDAB in the last 72 hours.  Invalid input(s): FREET3 Anemia work up No results for input(s): VITAMINB12, FOLATE, FERRITIN, TIBC, IRON, RETICCTPCT in the last 72 hours. Urinalysis    Component Value Date/Time   COLORURINE AMBER (A) 04/24/2020 1317  APPEARANCEUR HAZY (A) 04/24/2020 1317   LABSPEC 1.044 (H) 04/24/2020 1317   PHURINE 6.0 04/24/2020 1317   GLUCOSEU NEGATIVE 04/24/2020 1317   HGBUR NEGATIVE 04/24/2020 1317   [place] 04/24/2020 1317   KETONESUR NEGATIVE 04/24/2020 1317   PROTEINUR NEGATIVE 04/24/2020 1317   NITRITE NEGATIVE 04/24/2020 1317   LEUKOCYTESUR NEGATIVE 04/24/2020 1317   Sepsis Labs Invalid input(s): PROCALCITONIN,  WBC,  LACTICIDVEN Microbiology Recent Results (from the past 240 hour(s))  SARS Coronavirus 2 by RT PCR (hospital order, performed in [place] hospital lab) Nasopharyngeal Nasopharyngeal Swab     Status: None   Collection Time: 04/24/20  6:40 PM   Specimen: Nasopharyngeal Swab  Result Value Ref Range Status   SARS Coronavirus 2 NEGATIVE NEGATIVE Final    Comment: (NOTE) SARS-CoV-2 target nucleic acids are NOT DETECTED. The SARS-CoV-2 RNA is generally detectable in upper and  lower respiratory specimens during the acute phase of infection. The lowest concentration of SARS-CoV-2 viral copies this assay can detect is 250 copies / mL. A negative result does not preclude SARS-CoV-2 infection and should not be used as the sole basis for treatment or other patient management decisions.  A negative result may occur with improper specimen collection / handling, submission of specimen other than nasopharyngeal swab, presence of viral mutation(s) within the areas targeted by this assay, and inadequate number of viral copies (<250 copies / mL). A negative result must be combined with clinical observations, patient history, and epidemiological information. Fact Sheet for Patients:   StrictlyIdeas.no Fact Sheet for Healthcare Providers: BankingDealers.co.za This test is not yet approved or cleared  by the [place] FDA and has been authorized for detection and/or diagnosis of SARS-CoV-2 by FDA under an Emergency Use Authorization (EUA).  This EUA will remain in effect (meaning this test can be used) for the duration of the COVID-19 declaration under Section 564(b)(1) of the Act, 21 U.S.C. section 360bbb-3(b)(1), unless the authorization is terminated or revoked sooner. Performed at [facility], [address] [place], [place] [address]    Time coordinating discharge:   SIGNED:  Irwin Brakeman, MD  [facility] 04/26/2020, 4:02 PM How to contact the [facility] Attending or Consulting provider [place] or covering provider during after hours [place], for this patient?  1. Check the care team in [facility] and look for a) attending/consulting [facility] provider listed and b) the [facility] team listed 2. Log into [email or web address] and use [place]'s universal password to access. If you do not have the password, please contact the hospital operator. 3. Locate the [facility] provider you are looking for under [facility] and page to a number  that you can be directly reached. 4. If you still have difficulty reaching the provider, please page the [facility] (Director on Call) for the Hospitalists listed on amion for assistance.

## 2020-04-26 NOTE — Progress Notes (Signed)
Physical Therapy Treatment Patient Details Name: Stacey [name] MRN: [id number] DOB: May 09, 1946 Today's Date: 04/26/2020    History of Present Illness Stacey [name] is a 74 y.o. female with medical history significant for systolic and diastolic CHF, COPD, atrial fibrillation, metastatic breast cancer, hypertension, left bundle branch block, coronary artery disease.Patient presented to the ED with reports of worsening low back pain over the past 2 weeks.  Patient has baseline chronic back pain.  But she reports this has worsened significantly over the past 2 weeks, radiating from her back anteriorly towards her abdomen and right groin.  She is unable to walk due to pain, which she has no weakness of her extremities.  No abnormal sensation.  She has chronic urinary incontinence for which she wears depends, no bowel changes.  She denies falls.  She had sacral iliac joint injections by [facility] pain medicine clinic last injection - 03/16/20, the first helped but the second did not help.  Home pain medications are no longer helping.  She has tried several topical treatments without improvement also tried duloxetine and pregabalin.She denies vomiting or loose stools, no difficulty breathing no cough, no fever or chills.    PT Comments    Pt supine in bed upon arrival, refuses to not sit EOB or ambulate due to 10/10 LBP, but is interested in learning BLE strengthening exercises. Pt with good motor control with ankle pumps, heel slides, and glute sets but increased low back pain with quad sets and hip abd/add requiring decreased reps with these exercises. Therapist wrote down exercises at pt's request so pt could perform another set later today when pain decreases. Session limited by LBP and unable to get out of bed. Patient will benefit from continued physical therapy in hospital and recommendations below to increase strength, balance, endurance for safe ADLs and gait.    Follow Up Recommendations  Home  health PT;Supervision for mobility/OOB;Supervision - Intermittent     Equipment Recommendations  None recommended by PT    Recommendations for Other Services       Precautions / Restrictions Precautions Precautions: Fall Restrictions Weight Bearing Restrictions: No    Mobility  Bed Mobility  General bed mobility comments: defers 2* back pain  Transfers  General transfer comment: defers 2* back pain  Ambulation/Gait  General Gait Details: defers 2* back pain   Stairs             Wheelchair Mobility    Modified Rankin (Stroke Patients Only)       Balance           Cognition Arousal/Alertness: Awake/alert Behavior During Therapy: WFL for tasks assessed/performed Overall Cognitive Status: Within Functional Limits for tasks assessed           Exercises General Exercises - Lower Extremity Ankle Circles/Pumps: Supine;Both;10 reps Quad Sets: Supine;Both;5 reps(limited by pain) Gluteal Sets: Supine;Both;10 reps Heel Slides: Supine;Both;10 reps Hip ABduction/ADduction: Supine;Both;5 reps(limited by pain)    General Comments General comments (skin integrity, edema, etc.): Pt declines bed mobility, transfers or ambulation 2* back pain; but agreeable to learning supine BLE strengthening exercises.      Pertinent Vitals/Pain Pain Assessment: 0-10 Pain Score: 10-Worst pain ever Pain Descriptors / Indicators: Aching;Constant;Sore Pain Intervention(s): Limited activity within patient's tolerance;Monitored during session    Home Living               Prior Function            PT Goals (current goals can now be found  in the care plan section) Acute Rehab PT Goals Patient Stated Goal: return home with family to assist PT Goal Formulation: With patient Time For Goal Achievement: 04/28/20 Potential to Achieve Goals: Good Progress towards PT goals: Not progressing toward goals - comment(very limited by pain)    Frequency    Min 3X/week       PT Plan Current plan remains appropriate    Co-evaluation              AM-PAC PT "6 Clicks" Mobility   Outcome Measure  Help needed turning from your back to your side while in a flat bed without using bedrails?: A Little Help needed moving from lying on your back to sitting on the side of a flat bed without using bedrails?: A Lot Help needed moving to and from a bed to a chair (including a wheelchair)?: A Lot Help needed standing up from a chair using your arms (e.g., wheelchair or bedside chair)?: A Little Help needed to walk in hospital room?: A Lot Help needed climbing 3-5 steps with a railing? : A Lot 6 Click Score: 14    End of Session   Activity Tolerance: Patient limited by pain Patient left: in bed;with call bell/phone within reach;with bed alarm set Nurse Communication: Mobility status PT Visit Diagnosis: Unsteadiness on feet (R26.81);Other abnormalities of gait and mobility (R26.89);Muscle weakness (generalized) (M62.81)     Time: [id number] PT Time Calculation (min) (ACUTE ONLY): 13 min  Charges:  $Therapeutic Exercise: 8-22 mins                      Stacey [name] PT, DPT 04/26/20, 11:38 AM [phone]

## 2020-04-27 ENCOUNTER — Telehealth: Payer: Self-pay | Admitting: *Deleted

## 2020-04-27 ENCOUNTER — Telehealth: Payer: Self-pay | Admitting: Cardiology

## 2020-04-27 NOTE — Telephone Encounter (Signed)
Vaughan Basta made aware and appreciative

## 2020-04-27 NOTE — Telephone Encounter (Signed)
Pt voiced understanding - routed to pcp  

## 2020-04-27 NOTE — Telephone Encounter (Signed)
-----   Message from Arnoldo Lenis, MD sent at 04/26/2020  4:24 PM EDT ----- Some improvement in heart function, up to 35-40%. Normal is 50-60%. We will continue working with meds to strengthen  Stacey Abts MD

## 2020-04-27 NOTE — Telephone Encounter (Signed)
Yes that is fine   J Latroy Gaymon MD 

## 2020-04-27 NOTE — Telephone Encounter (Signed)
Romualdo Bolk RN with [facility] called in regards to patient Stacey [name]. She was discharged from [facility] on 04/26/2020. Dr. Wynetta Emery is requesting that [place] check her PT/INR today and on Sunday. Patient does not have PCP . [facility] is trying to get her established with PCP immediately. They are requesting to see if Dr. Harl Bowie will sign orders for them to go to there house until PCP can be established.  Please call [phone].

## 2020-05-07 ENCOUNTER — Telehealth: Payer: Self-pay | Admitting: Orthopaedic Surgery

## 2020-05-07 NOTE — Telephone Encounter (Signed)
Call received from [facility], occupational therapist with [facility], called for verbal orders for home therapy; states they will be addressing ADL's, ADL transfers, and shoulder therapy, as follows:   - 2 times per week for 4 weeks  - 1 time per week for 3 weeks Please call ph# [phone] (said may leave a detailed message)

## 2020-05-07 NOTE — Telephone Encounter (Signed)
Routing to Dr. Luna Glasgow

## 2020-05-09 ENCOUNTER — Telehealth: Payer: Self-pay | Admitting: Cardiology

## 2020-05-09 NOTE — Telephone Encounter (Signed)
Give them the orders they need.

## 2020-05-09 NOTE — Telephone Encounter (Signed)
Stacey [name] -with [facility] requesting to get orders for patient to have occupational therapy. [phone] (can leave message)

## 2020-05-09 NOTE — Telephone Encounter (Signed)
[name] with [facility] [phone] also requesting orders for PT for pt (pt doesn't have pcp at this time)

## 2020-05-10 NOTE — Telephone Encounter (Signed)
I don't see where you have ordered this, please advise.

## 2020-05-11 NOTE — Telephone Encounter (Signed)
[facility] made aware and says pt is back in hospital

## 2020-05-11 NOTE — Telephone Encounter (Signed)
[place] for order, what is the status of getting a PCP. I will only continue to assist with these primary care needs anonther 2 weeks, this is not part of our practice    Zandra Abts MD

## 2020-05-14 NOTE — Telephone Encounter (Signed)
I have stated give them the orders they need for home health.  Read the first note from [facility].  This way I do not have to repeat myself.  Sometimes I will just write "do it" or "make it so".  Thanks

## 2020-05-14 NOTE — Telephone Encounter (Signed)
I called Wendelyn Breslow again and left message on her voicemail(secured) stating that I am giving verbal orders as wanted by Dr. Luna Glasgow.  Also, said if she needs to speak with me please give me a call.

## 2020-05-22 ENCOUNTER — Ambulatory Visit: Payer: Medicare Other | Admitting: Orthopaedic Surgery

## 2020-05-23 ENCOUNTER — Encounter: Payer: Self-pay | Admitting: Orthopaedic Surgery

## 2020-05-24 ENCOUNTER — Telehealth: Payer: Self-pay | Admitting: *Deleted

## 2020-05-24 ENCOUNTER — Telehealth: Payer: Self-pay | Admitting: Orthopaedic Surgery

## 2020-05-24 NOTE — Telephone Encounter (Signed)
Cat with home health wanted to make Dr Harl Bowie aware that pt fell today and hit her head, EMS was called and pt refused to go to ED home health came out and recommended pt go to ED but pt again refused - also requested provider signature for PT - Cat made aware that Dr Harl Bowie would not sign anymore orders as per 6/9 phone note as pt needed a primary care provider - will forward as [facility]

## 2020-05-24 NOTE — Telephone Encounter (Signed)
Call from [facility], occupational therapist at [facility] for verbal orders; states patient was back in hospital again and discharged to home. (note: this is reason patient was unable to come to appointment 05/22/20) Please advise regarding verbal orders for patient's same arm/rotator cuff, for 1 time a week for 1 week, then 2 times a week for 3 weeks; states for range of motion and for assistance with ADL's and for safety. Please leave the message on her confidential voice mail at [phone].

## 2020-05-24 NOTE — Telephone Encounter (Signed)
Yes, its been nearly a month since I agreed to help with home health orders. This is something our practice does not do as we are a specialty clinic and these are primary care needs we are not suited to manage. We agreed as a courtesy for a short period of time as she established with a pcp but at a month has gone by and we will no longer manage these primary care needs, a pcp should have been able to be established within this time frame and we will defer any further orders to them.    Carlyle Dolly MD

## 2020-05-24 NOTE — Telephone Encounter (Signed)
Done

## 2020-05-25 ENCOUNTER — Emergency Department (HOSPITAL_COMMUNITY)
Admission: EM | Admit: 2020-05-25 | Discharge: 2020-05-25 | Disposition: A | Discharge status: Home / Self Care | Payer: Medicare Other

## 2020-05-25 DIAGNOSIS — G8929 Other chronic pain: Secondary | ICD-10-CM

## 2020-05-25 DIAGNOSIS — M549 Dorsalgia, unspecified: Secondary | ICD-10-CM

## 2020-05-25 NOTE — ED Triage Notes (Signed)
Pt brought by ems wanting to be transferred to [facility]. Family wanted EMS to transport to [facility] Pt has L1/L2 compression fx and is being followed by [facility] for chronic pain management and palliative care. Pt had fall on wed night . Dr Reine Just spoke with pt family member and explained we could not promise any transport to [facility] and that we would have to work her up and speak with [facility]. Family states understanding

## 2020-05-26 ENCOUNTER — Other Ambulatory Visit: Payer: Self-pay | Admitting: Cardiology

## 2020-06-07 ENCOUNTER — Telehealth: Payer: Self-pay | Admitting: Cardiology

## 2020-06-07 NOTE — Telephone Encounter (Signed)
Returned call to [facility]. No answer. Left msg to call back

## 2020-06-07 NOTE — Telephone Encounter (Signed)
Per [facility] call. Has update about Pt.   Jennifer/[phone]    Thanks renee

## 2020-06-07 NOTE — Telephone Encounter (Signed)
[facility] [place] asking that Dr. Harl Bowie sign [facility] Start of care on pt. Form to be faxed to office.

## 2020-06-24 ENCOUNTER — Emergency Department (HOSPITAL_COMMUNITY)
Admission: EM | Admit: 2020-06-24 | Discharge: 2020-06-24 | Disposition: A | Discharge status: Home / Self Care | Payer: Medicare Other | Attending: Emergency Medicine | Admitting: Emergency Medicine

## 2020-06-24 ENCOUNTER — Emergency Department (HOSPITAL_COMMUNITY): Payer: Medicare Other

## 2020-06-24 ENCOUNTER — Other Ambulatory Visit: Payer: Self-pay

## 2020-06-24 ENCOUNTER — Encounter (HOSPITAL_COMMUNITY): Payer: Self-pay

## 2020-06-24 DIAGNOSIS — M7918 Myalgia, other site: Secondary | ICD-10-CM | POA: Diagnosis not present

## 2020-06-24 DIAGNOSIS — R52 Pain, unspecified: Secondary | ICD-10-CM

## 2020-06-24 DIAGNOSIS — J449 Chronic obstructive pulmonary disease, unspecified: Secondary | ICD-10-CM | POA: Insufficient documentation

## 2020-06-24 DIAGNOSIS — I2510 Atherosclerotic heart disease of native coronary artery without angina pectoris: Secondary | ICD-10-CM | POA: Diagnosis not present

## 2020-06-24 DIAGNOSIS — I110 Hypertensive heart disease with heart failure: Secondary | ICD-10-CM | POA: Diagnosis not present

## 2020-06-24 DIAGNOSIS — G8929 Other chronic pain: Secondary | ICD-10-CM

## 2020-06-24 DIAGNOSIS — M545 Low back pain, unspecified: Secondary | ICD-10-CM

## 2020-06-24 DIAGNOSIS — I509 Heart failure, unspecified: Secondary | ICD-10-CM | POA: Insufficient documentation

## 2020-06-24 DIAGNOSIS — C50919 Malignant neoplasm of unspecified site of unspecified female breast: Secondary | ICD-10-CM

## 2020-06-24 DIAGNOSIS — C7981 Secondary malignant neoplasm of breast: Secondary | ICD-10-CM | POA: Insufficient documentation

## 2020-06-24 DIAGNOSIS — R102 Pelvic and perineal pain: Secondary | ICD-10-CM | POA: Insufficient documentation

## 2020-06-24 DIAGNOSIS — Z87891 Personal history of nicotine dependence: Secondary | ICD-10-CM | POA: Diagnosis not present

## 2020-06-24 MED ORDER — ONDANSETRON HCL 4 MG/2ML IJ SOLN
4.0000 mg | Freq: Once | INTRAMUSCULAR | Status: AC
  Administered 2020-06-24: 4 mg via INTRAVENOUS
  Filled 2020-06-24: qty 2

## 2020-06-24 MED ORDER — HYDROMORPHONE HCL 1 MG/ML IJ SOLN
1.0000 mg | Freq: Once | INTRAMUSCULAR | Status: AC
  Administered 2020-06-24: 1 mg via INTRAVENOUS
  Filled 2020-06-24: qty 1

## 2020-06-24 MED ORDER — OXYCODONE-ACETAMINOPHEN 5-325 MG PO TABS: 1.0000 | ORAL_TABLET | ORAL | 0 refills | Status: DC

## 2020-06-24 NOTE — ED Notes (Signed)
Pt stated she refused her pain meds at facility this morning because it was not extended release and she knew that it would not work. She would like to have a order for extended release as another Dr. had RX but pt could not remember their name.

## 2020-06-24 NOTE — ED Notes (Signed)
EMS arrived with patient

## 2020-06-24 NOTE — ED Triage Notes (Signed)
Pt to er via ems, per ems pt is here for pain, states that she in on three pain meds, states that she refused her oxy today and is having back pain from cancer and arthritis.

## 2020-06-24 NOTE — ED Provider Notes (Signed)
[facility] EMERGENCY DEPARTMENT Provider Note   CSN: [id number] Arrival date & time: 06/24/20  1245     History Chief Complaint  Patient presents with  . Back Pain    Stacey [name] is a 74 y.o. female with a history significant for metastatic breast cancer under the management of chronic pain and palliative care presenting from a local nursing facility with complaints of worsening low back pain.  She has known metastatic changes at her L2 lumbar region.  She reports having increased pain in her left posterior pelvis region which is new over the past several days.  She is currently taking methadone 3 times daily for pain relief, and has oxycodone 10 mg for every 3 hour as needed use.  She states when she was recently admitted at [facility] she received the oxycodone every 3 hours even through the night and had much better pain relief.  Her current prescription is as needed and she states she does not receive the oxycodone enough, wakes up in the morning with severe pain.  She denies any new injuries or falls but is concerned about worsening metastatic changes given the new left low back pain.  She denies fevers, chills, no other complaints.  Her pain is worsened with any movement but is also constant at rest.  HPI     Past Medical History:  Diagnosis Date  . CAD in native artery    a. DES to ramus 2005 with late stent thrombosis 2006 tx with PTCA. 12/18 PCI/DES x1 to mRCA, EF 50-55%  . CHF (congestive heart failure) ([place])   . Chronic pain   . COPD (chronic obstructive pulmonary disease) ([place])   . Hyperlipidemia   . Hypertension   . Left bundle branch block   . Lymphedema   . Metastatic breast cancer ([place])    a. to bone.  . MI (myocardial infarction) ([place])   . Mild aortic stenosis 10/2017  . Morbid obesity ([place])   . PAF (paroxysmal atrial fibrillation) ([place])   . PSVT (paroxysmal supraventricular tachycardia) ([place])    a. per [facility] notes, seen on event monitor in 2014.  . Pulmonary  nodules     Patient Active Problem List   Diagnosis Date Noted  . Spinal stenosis of lumbar region   . Bone metastases ([place])   . Palliative care by specialist   . Lumbar pain 04/24/2020  . Lumbar radiculopathy 02/16/2020  . Palpitations 11/16/2019  . Dysuria   . Acute bilateral low back pain 10/02/2019  . Bipolar 2 disorder, major depressive episode ([place]) 09/01/2019  . Adjustment disorder with depressed mood 06/30/2019  . Encounter for therapeutic drug monitoring 12/28/2018  . Atrial fibrillation with rapid ventricular response ([place]) 11/14/2018  . Fall   . Obesity, Class III, BMI 40-49.9 (morbid obesity) ([place])   . Chronic respiratory failure with hypoxia ([place])   . Elevated troponin 10/07/2018  . PSVT (paroxysmal supraventricular tachycardia) ([place]) 10/07/2018  . Hyperlipidemia 10/07/2018  . COPD (chronic obstructive pulmonary disease) ([place]) 10/07/2018  . CAD in native artery 10/07/2018  . Chronic pain 10/07/2018  . Acute back pain 10/07/2018  . Acute hypoxemic respiratory failure ([place]) 09/19/2018  . Acute on chronic combined systolic and diastolic CHF (congestive heart failure) ([place]) 09/19/2018  . COPD with acute exacerbation ([place]) 09/19/2018  . Dyspnea 11/30/2017  . Non-ST elevation (NSTEMI) myocardial infarction ([place])   . Hypertension 11/23/2017  . Left bundle branch block 11/23/2017  . Acute on chronic diastolic CHF (congestive heart failure) ([place])  11/23/2017  . CAD (coronary artery disease) 11/23/2017  . Metastatic breast cancer ([place]) 11/23/2017  . Acute respiratory failure with hypoxia ([place]) 11/23/2017  . Atypical nevus 07/29/2016  . Age-related nuclear cataract, bilateral 02/12/2016  . Anxiety and depression 01/24/2016  . Arthropathy, lower leg 09/14/2013  . Tibialis tendinitis 02/22/2013  . Plantar fasciitis 01/17/2013    Past Surgical History:  Procedure Laterality Date  . CORONARY STENT INTERVENTION N/A 11/26/2017   Procedure: CORONARY STENT INTERVENTION;   Surgeon: Martinique, Peter M, MD;  Location: [place] [facility];  Service: Cardiovascular;  Laterality: N/A;  . CORONARY STENT PLACEMENT    . LEFT HEART CATH AND CORONARY ANGIOGRAPHY N/A 11/26/2017   Procedure: LEFT HEART CATH AND CORONARY ANGIOGRAPHY;  Surgeon: Martinique, Peter M, MD;  Location: [place] [facility];  Service: Cardiovascular;  Laterality: N/A;     OB History   No obstetric history on file.     Family History  Problem Relation Age of Onset  . CAD Father 64  . Heart attack Father   . COPD Sister   . CAD Paternal Grandmother   . Sudden Cardiac Death Neg Hx     Social History   Tobacco Use  . Smoking status: Former Smoker    Packs/day: 2.00    Years: 18.00    Pack years: 36.00    Types: Cigarettes    Quit date: 12/01/1980    Years since quitting: 39.5  . Smokeless tobacco: Never Used  Vaping Use  . Vaping Use: Never used  Substance Use Topics  . Alcohol use: No  . Drug use: No    Home Medications Prior to Admission medications   Medication Sig Start Date End Date Taking? Authorizing Provider  methadone (DOLOPHINE) 5 MG tablet Take 5 mg by mouth 3 (three) times daily.   Yes [provider]  oxycodone (OXY-IR) 5 MG capsule Take 10 mg by mouth every 3 (three) hours as needed.   Yes [provider]  acetaminophen (TYLENOL) 500 MG tablet Take 2 tablets (1,000 mg total) by mouth in the morning, at noon, and at bedtime. 04/26/20   Johnson, Clanford L, MD  albuterol (ACCUNEB) 0.63 MG/3ML nebulizer solution Take 1 ampule by nebulization every 6 (six) hours as needed for shortness of breath (summer time.).    [provider]  atorvastatin (LIPITOR) 80 MG tablet Take 1 tablet (80 mg total) by mouth daily. 05/28/20   Arnoldo Lenis, MD  b complex vitamins tablet Take 1 tablet by mouth every morning.     [provider]  calcitonin, salmon, (MIACALCIN/FORTICAL) 200 UNIT/ACT nasal spray Place 1 spray into alternate nostrils daily. 04/27/20    Johnson, Clanford L, MD  Calcium Citrate-Vitamin D (CITRACAL MAXIMUM) 315-250 MG-UNIT TABS Take 2 tablets by mouth daily.     [provider]  clopidogrel (PLAVIX) 75 MG tablet Take 1 tablet (75 mg total) by mouth daily. 03/28/20   Arnoldo Lenis, MD  DULoxetine (CYMBALTA) 30 MG capsule Take 30 mg by mouth daily.    [provider]  DULoxetine (CYMBALTA) 60 MG capsule Take 1 capsule by mouth daily. 08/01/19   [provider]  letrozole (FEMARA) 2.5 MG tablet Take 2.5 mg by mouth at bedtime.     [provider]  lidocaine-prilocaine (EMLA) cream Apply 1 application topically daily as needed (knee pain).  06/28/19   [provider]  losartan (COZAAR) 100 MG tablet Take 1 tablet (100 mg total) by mouth daily. 03/25/19  Arnoldo Lenis, MD  metoprolol succinate (TOPROL-XL) 100 MG 24 hr tablet TAKE 1 AND 1/2 TABLETS DAILY 03/28/20   Arnoldo Lenis, MD  Multiple Vitamins-Minerals (EQ VISION FORMULA 50+ PO) Take 1 tablet by mouth every morning.    [provider]  Multiple Vitamins-Minerals (HAIR/SKIN/NAILS) CAPS Take 1 capsule by mouth every morning.    [provider]  nitroGLYCERIN (NITROSTAT) 0.4 MG SL tablet Place 1 tablet (0.4 mg total) under the tongue every 5 (five) minutes x 3 doses as needed for chest pain (if no relief after 3rd dose, proceed to the ED or call 911). 03/22/20   Arnoldo Lenis, MD  oxyCODONE-acetaminophen (PERCOCET/ROXICET) 5-325 MG tablet Take 1 tablet by mouth every 3 (three) hours. 06/24/20   Evalee Jefferson, PA-C  Potassium 99 MG TABS Take 2 tablets by mouth every morning.     [provider]  pregabalin (LYRICA) 100 MG capsule Take 1 capsule by mouth at bedtime.  08/01/19   [provider]  spironolactone (ALDACTONE) 25 MG tablet TAKE 1/2 TABLET(12.5 MG) BY MOUTH DAILY 03/28/20   Arnoldo Lenis, MD  torsemide (DEMADEX) 20 MG tablet Take 10 mg by mouth daily.    [provider]    Ubiquinol (QUNOL COQ10/UBIQUINOL/MEGA) 100 MG CAPS Take 1 capsule by mouth every morning. QUNOL-Ultra COQ10    [provider]  warfarin (COUMADIN) 1 MG tablet Take 2 tablets daily or as directed by [facility] 04/29/20   Murlean Iba, MD    Allergies    Brilinta [ticagrelor] and Albuterol  Review of Systems   Review of Systems  Constitutional: Negative for chills and fever.  HENT: Negative.   Eyes: Negative.   Respiratory: Negative for chest tightness and shortness of breath.   Cardiovascular: Negative for chest pain.  Gastrointestinal: Negative for abdominal pain, nausea and vomiting.  Genitourinary: Negative.   Musculoskeletal: Positive for arthralgias and back pain. Negative for joint swelling and neck pain.  Skin: Negative.   Neurological: Negative for dizziness, weakness, light-headedness, numbness and headaches.  Psychiatric/Behavioral: Negative.     Physical Exam Updated Vital Signs BP 122/71   Pulse 62   Temp 97.9 F (36.6 C) (Oral)   Resp 18   Ht 5' (1.524 m)   Wt (!) 92.5 kg   SpO2 97%   BMI 39.84 kg/m   Physical Exam Vitals and nursing note reviewed.  Constitutional:      Appearance: She is well-developed.  HENT:     Head: Normocephalic and atraumatic.  Eyes:     Conjunctiva/sclera: Conjunctivae normal.  Cardiovascular:     Rate and Rhythm: Normal rate and regular rhythm.     Heart sounds: Normal heart sounds.  Pulmonary:     Effort: Pulmonary effort is normal.     Breath sounds: Normal breath sounds. No wheezing.  Abdominal:     General: Bowel sounds are normal.     Palpations: Abdomen is soft.     Tenderness: There is no abdominal tenderness.  Musculoskeletal:        General: Tenderness present. Normal range of motion.     Cervical back: Normal range of motion.       Legs:     Comments: Patient has tenderness to palpation along her left dorsal pelvic rim.  She has no midline lumbar tenderness or deformity.  Skin  is intact,  no rash, bruising or hematoma.  Skin:    General: Skin is warm and dry.  Neurological:  Mental Status: She is alert.     ED Results / Procedures / Treatments   Labs (all labs ordered are listed, but only abnormal results are displayed) Labs Reviewed - No data to display  EKG None  Radiology DG Lumbar Spine Complete  Result Date: 06/24/2020 CLINICAL DATA:  Pain, known metastatic breast cancer EXAM: LUMBAR SPINE - COMPLETE 4+ VIEW COMPARISON:  CT abdomen and pelvis 04/24/2020 FINDINGS: Five non-rib-bearing lumbar vertebra with partial sacralization of LEFT transverse process of L5. Diffuse osseous demineralization. Levoconvex thoracolumbar scoliosis. Multilevel disc space narrowing and endplate spur formation. Multilevel facet degenerative changes. Sclerosis at L2 corresponding to known sclerotic metastasis. Chronic superior endplate concavity at L1 unchanged. No acute fracture or bone destruction. Minimal anterolisthesis L4-L5 likely degenerative. Atherosclerotic calcifications aorta. IMPRESSION: Multilevel degenerative disc and facet disease changes of lumbar spine with levoconvex scoliosis. Unchanged mild superior endplate concavity at L1. Known sclerotic metastasis L2. No acute abnormalities. Electronically Signed   By: Lavonia Dana M.D.   On: 06/24/2020 16:40   DG HIPS BILAT WITH PELVIS 2V  Result Date: 06/24/2020 CLINICAL DATA:  BILATERAL hip pain, new and worse on LEFT EXAM: DG HIP (WITH OR WITHOUT PELVIS) 2V BILAT COMPARISON:  CT abdomen and pelvis 04/24/2020 FINDINGS: Osseous demineralization. Hip and SI joint spaces preserved. Facet degenerative changes at lower lumbar spine. No acute fracture, dislocation, or bone destruction. Sclerotic focus identified at the LEFT inferior pubic ramus, corresponding to a sclerotic region on CT likely a sclerotic metastasis. IMPRESSION: Probable sclerotic metastasis at the LEFT inferior pubic ramus. No additional acute osseous abnormalities.  Electronically Signed   By: Lavonia Dana M.D.   On: 06/24/2020 16:43    Procedures Procedures (including critical care time)  Medications Ordered in ED Medications  HYDROmorphone (DILAUDID) injection 1 mg (1 mg Intravenous Given 06/24/20 1630)  ondansetron (ZOFRAN) injection 4 mg (4 mg Intravenous Given 06/24/20 1630)    ED Course  I have reviewed the triage vital signs and the nursing notes.  Pertinent labs & imaging results that were available during my care of the patient were reviewed by me and considered in my medical decision making (see chart for details).    MDM Rules/Calculators/A&P                          Imaging reviewed with no acute changes in her metastatic condition.  Patient was also seen by Dr. Gilford Raid during this ED visit.  She was given an IV dose of Dilaudid while here and obtained significant pain relief.  We have she receives her oxycodone every 3 hours around-the-clock.  This was relayed to the nursing staff by our RN who was agreeable.  I also recommended that she contact her MD tomorrow to officially change these orders if they are agreeable with this more consistent pain medication schedule. Final Clinical Impression(s) / ED Diagnoses Final diagnoses:  Pain  Chronic left-sided low back pain without sciatica  Metastatic breast cancer [facility])    Rx / DC Orders ED Discharge Orders         Ordered    oxyCODONE-acetaminophen (PERCOCET/ROXICET) 5-325 MG tablet  Every  3 hours     Discontinue  Reprint     06/24/20 1815           Landis Martins 06/24/20 1825    Isla Pence, MD 06/24/20 1911

## 2020-06-24 NOTE — ED Notes (Signed)
Spoke with [employer] to set up transportation back to facility at this time.

## 2020-06-24 NOTE — ED Notes (Signed)
Son notified of changes

## 2020-06-24 NOTE — Discharge Instructions (Addendum)
Your pain medicine needs to be adjusted in the following ways to give better pain control:  Methadone  5 mg - need to take every 8 hours. Oxycodone - 10 mg - give dose every 3 hours instead of just as needed for more even control of pain. ' Please contact your primary MD tomorrow so these adjustments can be made in your prescriptions if he agrees with this plan for better control of your pain.  In the interim, you have received sample oxycodone medicine to help with pain relief until you can contact your MD.

## 2020-06-25 MED FILL — Oxycodone w/ Acetaminophen Tab 5-325 MG: ORAL | Qty: 6 | Status: AC

## 2020-06-26 ENCOUNTER — Inpatient Hospital Stay (HOSPITAL_COMMUNITY)
Admission: EM | Admit: 2020-06-26 | Discharge: 2020-07-05 | DRG: 543 | Disposition: A | Discharge status: Home Health | Payer: Medicare Other | Attending: Internal Medicine | Admitting: Internal Medicine

## 2020-06-26 ENCOUNTER — Emergency Department (HOSPITAL_COMMUNITY)
Admission: EM | Admit: 2020-06-26 | Discharge: 2020-06-26 | Disposition: A | Discharge status: Home / Self Care | Payer: Medicare Other | Source: Home / Self Care

## 2020-06-26 ENCOUNTER — Other Ambulatory Visit: Payer: Self-pay

## 2020-06-26 ENCOUNTER — Telehealth: Payer: Medicare Other | Admitting: Cardiology

## 2020-06-26 ENCOUNTER — Encounter (HOSPITAL_COMMUNITY): Payer: Self-pay | Admitting: Emergency Medicine

## 2020-06-26 ENCOUNTER — Emergency Department (HOSPITAL_COMMUNITY): Payer: Medicare Other

## 2020-06-26 DIAGNOSIS — I252 Old myocardial infarction: Secondary | ICD-10-CM

## 2020-06-26 DIAGNOSIS — Z6839 Body mass index (BMI) 39.0-39.9, adult: Secondary | ICD-10-CM

## 2020-06-26 DIAGNOSIS — I130 Hypertensive heart and chronic kidney disease with heart failure and stage 1 through stage 4 chronic kidney disease, or unspecified chronic kidney disease: Secondary | ICD-10-CM | POA: Diagnosis present

## 2020-06-26 DIAGNOSIS — C7951 Secondary malignant neoplasm of bone: Secondary | ICD-10-CM | POA: Diagnosis not present

## 2020-06-26 DIAGNOSIS — C50911 Malignant neoplasm of unspecified site of right female breast: Secondary | ICD-10-CM

## 2020-06-26 DIAGNOSIS — Z7902 Long term (current) use of antithrombotics/antiplatelets: Secondary | ICD-10-CM

## 2020-06-26 DIAGNOSIS — M545 Low back pain: Secondary | ICD-10-CM | POA: Diagnosis present

## 2020-06-26 DIAGNOSIS — Z5321 Procedure and treatment not carried out due to patient leaving prior to being seen by health care provider: Secondary | ICD-10-CM | POA: Insufficient documentation

## 2020-06-26 DIAGNOSIS — I5042 Chronic combined systolic (congestive) and diastolic (congestive) heart failure: Secondary | ICD-10-CM | POA: Diagnosis present

## 2020-06-26 DIAGNOSIS — Z170 Estrogen receptor positive status [ER+]: Secondary | ICD-10-CM

## 2020-06-26 DIAGNOSIS — C801 Malignant (primary) neoplasm, unspecified: Secondary | ICD-10-CM | POA: Diagnosis present

## 2020-06-26 DIAGNOSIS — J449 Chronic obstructive pulmonary disease, unspecified: Secondary | ICD-10-CM | POA: Diagnosis present

## 2020-06-26 DIAGNOSIS — M79606 Pain in leg, unspecified: Secondary | ICD-10-CM | POA: Insufficient documentation

## 2020-06-26 DIAGNOSIS — D631 Anemia in chronic kidney disease: Secondary | ICD-10-CM | POA: Diagnosis present

## 2020-06-26 DIAGNOSIS — M25552 Pain in left hip: Secondary | ICD-10-CM

## 2020-06-26 DIAGNOSIS — Z66 Do not resuscitate: Secondary | ICD-10-CM | POA: Diagnosis present

## 2020-06-26 DIAGNOSIS — Z955 Presence of coronary angioplasty implant and graft: Secondary | ICD-10-CM

## 2020-06-26 DIAGNOSIS — Z7901 Long term (current) use of anticoagulants: Secondary | ICD-10-CM

## 2020-06-26 DIAGNOSIS — Z853 Personal history of malignant neoplasm of breast: Secondary | ICD-10-CM

## 2020-06-26 DIAGNOSIS — Z7189 Other specified counseling: Secondary | ICD-10-CM

## 2020-06-26 DIAGNOSIS — Z8249 Family history of ischemic heart disease and other diseases of the circulatory system: Secondary | ICD-10-CM

## 2020-06-26 DIAGNOSIS — I2510 Atherosclerotic heart disease of native coronary artery without angina pectoris: Secondary | ICD-10-CM | POA: Diagnosis present

## 2020-06-26 DIAGNOSIS — Z515 Encounter for palliative care: Secondary | ICD-10-CM | POA: Diagnosis present

## 2020-06-26 DIAGNOSIS — Z20822 Contact with and (suspected) exposure to covid-19: Secondary | ICD-10-CM | POA: Diagnosis present

## 2020-06-26 DIAGNOSIS — I447 Left bundle-branch block, unspecified: Secondary | ICD-10-CM | POA: Diagnosis present

## 2020-06-26 DIAGNOSIS — N179 Acute kidney failure, unspecified: Secondary | ICD-10-CM | POA: Diagnosis present

## 2020-06-26 DIAGNOSIS — R627 Adult failure to thrive: Secondary | ICD-10-CM | POA: Diagnosis present

## 2020-06-26 DIAGNOSIS — G893 Neoplasm related pain (acute) (chronic): Secondary | ICD-10-CM | POA: Diagnosis present

## 2020-06-26 DIAGNOSIS — N182 Chronic kidney disease, stage 2 (mild): Secondary | ICD-10-CM | POA: Diagnosis present

## 2020-06-26 DIAGNOSIS — I480 Paroxysmal atrial fibrillation: Secondary | ICD-10-CM | POA: Diagnosis present

## 2020-06-26 DIAGNOSIS — E785 Hyperlipidemia, unspecified: Secondary | ICD-10-CM | POA: Diagnosis present

## 2020-06-26 DIAGNOSIS — Z79899 Other long term (current) drug therapy: Secondary | ICD-10-CM

## 2020-06-26 DIAGNOSIS — Z923 Personal history of irradiation: Secondary | ICD-10-CM

## 2020-06-26 DIAGNOSIS — Z85528 Personal history of other malignant neoplasm of kidney: Secondary | ICD-10-CM

## 2020-06-26 DIAGNOSIS — Z825 Family history of asthma and other chronic lower respiratory diseases: Secondary | ICD-10-CM

## 2020-06-26 MED ORDER — PREGABALIN 50 MG PO CAPS
100.0000 mg | ORAL_CAPSULE | Freq: Every day | ORAL | Status: DC
  Administered 2020-06-26: 100 mg via ORAL
  Filled 2020-06-26: qty 2

## 2020-06-26 MED ORDER — B COMPLEX-C PO TABS
1.0000 | ORAL_TABLET | Freq: Every morning | ORAL | Status: DC
  Administered 2020-06-28 – 2020-07-05 (×8): 1 via ORAL
  Filled 2020-06-26 (×11): qty 1

## 2020-06-26 MED ORDER — OXYCODONE-ACETAMINOPHEN 5-325 MG PO TABS
2.0000 | ORAL_TABLET | Freq: Once | ORAL | Status: AC
  Administered 2020-06-26: 2 via ORAL
  Filled 2020-06-26: qty 2

## 2020-06-26 MED ORDER — LETROZOLE 2.5 MG PO TABS
2.5000 mg | ORAL_TABLET | Freq: Every day | ORAL | Status: DC
  Administered 2020-06-27 – 2020-07-04 (×9): 2.5 mg via ORAL
  Filled 2020-06-26 (×12): qty 1

## 2020-06-26 MED ORDER — LOSARTAN POTASSIUM 50 MG PO TABS
100.0000 mg | ORAL_TABLET | Freq: Every day | ORAL | Status: DC
  Administered 2020-06-27 – 2020-06-28 (×2): 100 mg via ORAL
  Filled 2020-06-26: qty 4
  Filled 2020-06-26: qty 2

## 2020-06-26 MED ORDER — OXYCODONE HCL 5 MG PO TABS: 2.5000 mg | ORAL_TABLET | ORAL | Status: DC | PRN

## 2020-06-26 MED ORDER — CALCIUM CARBONATE-VITAMIN D 500-200 MG-UNIT PO TABS
1.0000 | ORAL_TABLET | Freq: Every day | ORAL | Status: DC
  Administered 2020-06-28 – 2020-07-05 (×8): 1 via ORAL
  Filled 2020-06-26 (×11): qty 1

## 2020-06-26 MED ORDER — METOPROLOL SUCCINATE ER 50 MG PO TB24
100.0000 mg | ORAL_TABLET | Freq: Every day | ORAL | Status: DC
  Administered 2020-06-27 – 2020-07-05 (×9): 100 mg via ORAL
  Filled 2020-06-26: qty 2
  Filled 2020-06-26: qty 4
  Filled 2020-06-26 (×7): qty 2

## 2020-06-26 MED ORDER — CALCITONIN (SALMON) 200 UNIT/ACT NA SOLN
1.0000 | Freq: Every day | NASAL | Status: DC
  Administered 2020-06-30 – 2020-07-05 (×6): 1 via NASAL
  Filled 2020-06-26: qty 3.7

## 2020-06-26 MED ORDER — ATORVASTATIN CALCIUM 40 MG PO TABS
80.0000 mg | ORAL_TABLET | Freq: Every day | ORAL | Status: DC
  Administered 2020-06-27 – 2020-07-05 (×9): 80 mg via ORAL
  Filled 2020-06-26 (×9): qty 2

## 2020-06-26 MED ORDER — OXYCODONE-ACETAMINOPHEN 7.5-325 MG PO TABS: 1.0000 | ORAL_TABLET | ORAL | Status: DC

## 2020-06-26 MED ORDER — OXYCODONE-ACETAMINOPHEN 5-325 MG PO TABS: 1.0000 | ORAL_TABLET | ORAL | Status: DC | PRN

## 2020-06-26 MED ORDER — DULOXETINE HCL 30 MG PO CPEP
30.0000 mg | ORAL_CAPSULE | Freq: Every day | ORAL | Status: DC
  Administered 2020-06-27 – 2020-06-28 (×2): 30 mg via ORAL
  Filled 2020-06-26 (×2): qty 1

## 2020-06-26 MED ORDER — HYDROMORPHONE HCL 1 MG/ML IJ SOLN
1.0000 mg | Freq: Once | INTRAMUSCULAR | Status: AC
  Administered 2020-06-26: 1 mg via INTRAMUSCULAR
  Filled 2020-06-26: qty 1

## 2020-06-26 MED ORDER — CLOPIDOGREL BISULFATE 75 MG PO TABS
75.0000 mg | ORAL_TABLET | Freq: Every day | ORAL | Status: DC
  Administered 2020-06-27 – 2020-07-05 (×9): 75 mg via ORAL
  Filled 2020-06-26 (×9): qty 1

## 2020-06-26 MED ORDER — METHADONE HCL 10 MG PO TABS
5.0000 mg | ORAL_TABLET | Freq: Three times a day (TID) | ORAL | Status: DC
  Administered 2020-06-26 – 2020-06-27 (×2): 5 mg via ORAL
  Filled 2020-06-26 (×2): qty 1

## 2020-06-26 NOTE — ED Provider Notes (Addendum)
[facility] EMERGENCY DEPARTMENT Provider Note   CSN: [id number] Arrival date & time: 06/26/20  1205     History Chief Complaint  Patient presents with  . Hip Pain    Stacey [name] is a 74 y.o. female with past medical history significant for chronic pain intolerant of care in the setting of metastatic breast cancer who presents to the ED from her skilled nursing facility with complaints of left hip pain.  Reviewed patient's medical record and she was evaluated on 06/24/2020 for same complaints.  Patient takes methadone 3 times daily for her pain with oxycodone 10 mg every 3 hours as needed for additional relief.  Patient was given IV Dilaudid in the ED before ultimately being discharged home with Percocet 5-325 mg scheduled every 3 hours in addition to the methadone 3 times daily.  Plan was for her to follow-up with her primary care provider or pain clinic for further management.  On my examination, patient states that she had no idea that her metastatic breast cancer had any relation to her current left hip pain symptoms.  She states that she was unclear as to her outpatient follow-up instructions at time of discharge from last ED encounter, however I personally reviewed the AVS which clearly delineated instructions and plan.  Patient is alert and oriented as well as answering questions appropriately, however her memory of the encounter is blurred.  She states that she would not have come back in if she knew that there was a probably metastatic lesion seen on her left inferior pubic ramus that is likely contributing to her several day hx of left hip pain.  Patient states that it is difficult for her to ambulate due to her pain symptoms.  She denies any new trauma, falls, numbness or weakness, fevers or chills, or other symptoms.  HPI     Past Medical History:  Diagnosis Date  . CAD in native artery    a. DES to ramus 2005 with late stent thrombosis 2006 tx with PTCA. 12/18 PCI/DES x1 to  mRCA, EF 50-55%  . CHF (congestive heart failure) ([place])   . Chronic pain   . COPD (chronic obstructive pulmonary disease) ([place])   . Hyperlipidemia   . Hypertension   . Left bundle branch block   . Lymphedema   . Metastatic breast cancer ([place])    a. to bone.  . MI (myocardial infarction) ([place])   . Mild aortic stenosis 10/2017  . Morbid obesity ([place])   . PAF (paroxysmal atrial fibrillation) ([place])   . PSVT (paroxysmal supraventricular tachycardia) ([place])    a. per [facility] notes, seen on event monitor in 2014.  . Pulmonary nodules     Patient Active Problem List   Diagnosis Date Noted  . Spinal stenosis of lumbar region   . Bone metastases ([place])   . Palliative care by specialist   . Lumbar pain 04/24/2020  . Lumbar radiculopathy 02/16/2020  . Palpitations 11/16/2019  . Dysuria   . Acute bilateral low back pain 10/02/2019  . Bipolar 2 disorder, major depressive episode ([place]) 09/01/2019  . Adjustment disorder with depressed mood 06/30/2019  . Encounter for therapeutic drug monitoring 12/28/2018  . Atrial fibrillation with rapid ventricular response ([place]) 11/14/2018  . Fall   . Obesity, Class III, BMI 40-49.9 (morbid obesity) ([place])   . Chronic respiratory failure with hypoxia ([place])   . Elevated troponin 10/07/2018  . PSVT (paroxysmal supraventricular tachycardia) ([place]) 10/07/2018  . Hyperlipidemia 10/07/2018  . COPD (chronic  obstructive pulmonary disease) ([place]) 10/07/2018  . CAD in native artery 10/07/2018  . Chronic pain 10/07/2018  . Acute back pain 10/07/2018  . Acute hypoxemic respiratory failure ([place]) 09/19/2018  . Acute on chronic combined systolic and diastolic CHF (congestive heart failure) ([place]) 09/19/2018  . COPD with acute exacerbation ([employer]) 09/19/2018  . Dyspnea 11/30/2017  . Non-ST elevation (NSTEMI) myocardial infarction ([place])   . Hypertension 11/23/2017  . Left bundle branch block 11/23/2017  . Acute on chronic diastolic CHF (congestive heart failure) ([place])  11/23/2017  . CAD (coronary artery disease) 11/23/2017  . Metastatic breast cancer ([place]) 11/23/2017  . Acute respiratory failure with hypoxia ([place]) 11/23/2017  . Atypical nevus 07/29/2016  . Age-related nuclear cataract, bilateral 02/12/2016  . Anxiety and depression 01/24/2016  . Arthropathy, lower leg 09/14/2013  . Tibialis tendinitis 02/22/2013  . Plantar fasciitis 01/17/2013    Past Surgical History:  Procedure Laterality Date  . CORONARY STENT INTERVENTION N/A 11/26/2017   Procedure: CORONARY STENT INTERVENTION;  Surgeon: Martinique, Peter M, MD;  Location: [place] [facility];  Service: Cardiovascular;  Laterality: N/A;  . CORONARY STENT PLACEMENT    . LEFT HEART CATH AND CORONARY ANGIOGRAPHY N/A 11/26/2017   Procedure: LEFT HEART CATH AND CORONARY ANGIOGRAPHY;  Surgeon: Martinique, Peter M, MD;  Location: [place] [facility];  Service: Cardiovascular;  Laterality: N/A;     OB History   No obstetric history on file.     Family History  Problem Relation Age of Onset  . CAD Father 32  . Heart attack Father   . COPD Sister   . CAD Paternal Grandmother   . Sudden Cardiac Death Neg Hx     Social History   Tobacco Use  . Smoking status: Former Smoker    Packs/day: 2.00    Years: 18.00    Pack years: 36.00    Types: Cigarettes    Quit date: 12/01/1980    Years since quitting: 39.5  . Smokeless tobacco: Never Used  Vaping Use  . Vaping Use: Never used  Substance Use Topics  . Alcohol use: No  . Drug use: No    Home Medications Prior to Admission medications   Medication Sig Start Date End Date Taking? Authorizing Provider  acetaminophen (TYLENOL) 500 MG tablet Take 2 tablets (1,000 mg total) by mouth in the morning, at noon, and at bedtime. 04/26/20   Johnson, Clanford L, MD  albuterol (ACCUNEB) 0.63 MG/3ML nebulizer solution Take 1 ampule by nebulization every 6 (six) hours as needed for shortness of breath (summer time.).    [provider]  atorvastatin  (LIPITOR) 80 MG tablet Take 1 tablet (80 mg total) by mouth daily. 05/28/20   Arnoldo Lenis, MD  b complex vitamins tablet Take 1 tablet by mouth every morning.     [provider]  calcitonin, salmon, (MIACALCIN/FORTICAL) 200 UNIT/ACT nasal spray Place 1 spray into alternate nostrils daily. 04/27/20   Johnson, Clanford L, MD  Calcium Citrate-Vitamin D (CITRACAL MAXIMUM) 315-250 MG-UNIT TABS Take 2 tablets by mouth daily.     [provider]  clopidogrel (PLAVIX) 75 MG tablet Take 1 tablet (75 mg total) by mouth daily. 03/28/20   Arnoldo Lenis, MD  DULoxetine (CYMBALTA) 30 MG capsule Take 30 mg by mouth daily.    [provider]  DULoxetine (CYMBALTA) 60 MG capsule Take 1 capsule by mouth daily. 08/01/19   [provider]  letrozole (FEMARA) 2.5 MG tablet Take 2.5 mg by mouth  at bedtime.     [provider]  lidocaine-prilocaine (EMLA) cream Apply 1 application topically daily as needed (knee pain).  06/28/19   [provider]  losartan (COZAAR) 100 MG tablet Take 1 tablet (100 mg total) by mouth daily. 03/25/19   Arnoldo Lenis, MD  methadone (DOLOPHINE) 5 MG tablet Take 5 mg by mouth 3 (three) times daily.    [provider]  metoprolol succinate (TOPROL-XL) 100 MG 24 hr tablet TAKE 1 AND 1/2 TABLETS DAILY 03/28/20   Arnoldo Lenis, MD  Multiple Vitamins-Minerals (EQ VISION FORMULA 50+ PO) Take 1 tablet by mouth every morning.    [provider]  Multiple Vitamins-Minerals (HAIR/SKIN/NAILS) CAPS Take 1 capsule by mouth every morning.    [provider]  nitroGLYCERIN (NITROSTAT) 0.4 MG SL tablet Place 1 tablet (0.4 mg total) under the tongue every 5 (five) minutes x 3 doses as needed for chest pain (if no relief after 3rd dose, proceed to the ED or call 911). 03/22/20   Arnoldo Lenis, MD  oxycodone (OXY-IR) 5 MG capsule Take 10 mg by mouth every 3 (three) hours as needed.    [provider]    oxyCODONE-acetaminophen (PERCOCET/ROXICET) 5-325 MG tablet Take 1 tablet by mouth every 3 (three) hours. 06/24/20   Evalee Jefferson, PA-C  Potassium 99 MG TABS Take 2 tablets by mouth every morning.     [provider]  pregabalin (LYRICA) 100 MG capsule Take 1 capsule by mouth at bedtime.  08/01/19   [provider]  spironolactone (ALDACTONE) 25 MG tablet TAKE 1/2 TABLET(12.5 MG) BY MOUTH DAILY 03/28/20   Arnoldo Lenis, MD  torsemide (DEMADEX) 20 MG tablet Take 10 mg by mouth daily.    [provider]  Ubiquinol (QUNOL COQ10/UBIQUINOL/MEGA) 100 MG CAPS Take 1 capsule by mouth every morning. QUNOL-Ultra COQ10    [provider]  warfarin (COUMADIN) 1 MG tablet Take 2 tablets daily or as directed by [facility] 04/29/20   Murlean Iba, MD    Allergies    Brilinta [ticagrelor] and Albuterol  Review of Systems   Review of Systems  Constitutional: Negative for fever.  Cardiovascular: Negative for leg swelling.  Musculoskeletal: Positive for arthralgias, back pain and gait problem. Negative for joint swelling.  Skin: Negative for wound.  Neurological: Negative for weakness and numbness.    Physical Exam Updated Vital Signs BP (!) 152/70 (BP Location: Left Arm)   Pulse 75   Temp 97.7 F (36.5 C) (Oral)   Resp 17   SpO2 95%   Physical Exam Vitals and nursing note reviewed. Exam conducted with a chaperone present.  HENT:     Head: Normocephalic and atraumatic.  Eyes:     General: No scleral icterus.    Conjunctiva/sclera: Conjunctivae normal.  Cardiovascular:     Rate and Rhythm: Normal rate and regular rhythm.     Pulses: Normal pulses.     Heart sounds: Normal heart sounds.  Pulmonary:     Effort: Pulmonary effort is normal. No respiratory distress.     Breath sounds: Normal breath sounds.  Musculoskeletal:       Legs:     Comments: Mild area of ecchymoses on lateral aspect of proximal left thigh.  No significant swelling,  erythema, or tenderness.  Compartments are soft.  Able to flex hip, albeit limited due to pain.  Tenderness appreciated over left lower back/buttock (see picture).  Peripheral pulses and sensation intact throughout.  Can  flex and extend ankle and knee.  Skin:    General: Skin is dry.     Capillary Refill: Capillary refill takes less than 2 seconds.  Neurological:     Mental Status: She is alert and oriented to person, place, and time.     GCS: GCS eye subscore is 4. GCS verbal subscore is 5. GCS motor subscore is 6.  Psychiatric:        Mood and Affect: Mood normal.        Behavior: Behavior normal.        Thought Content: Thought content normal.     ED Results / Procedures / Treatments   Labs (all labs ordered are listed, but only abnormal results are displayed) Labs Reviewed - No data to display  EKG None  Radiology No results found.  Procedures Procedures (including critical care time)  Medications Ordered in ED Medications  atorvastatin (LIPITOR) tablet 80 mg (has no administration in time range)  b complex vitamins tablet 1 tablet (has no administration in time range)  calcitonin (salmon) (MIACALCIN/FORTICAL) nasal spray 1 spray (has no administration in time range)  Calcium Citrate-Vitamin D 315-250 MG-UNIT TABS 2 tablet (has no administration in time range)  DULoxetine (CYMBALTA) DR capsule 30 mg (has no administration in time range)  letrozole [facility]) tablet 2.5 mg (has no administration in time range)  losartan (COZAAR) tablet 100 mg (has no administration in time range)  methadone (DOLOPHINE) tablet 5 mg (has no administration in time range)  metoprolol succinate (TOPROL-XL) 24 hr tablet 100 mg (has no administration in time range)  pregabalin (LYRICA) capsule 100 mg (has no administration in time range)  clopidogrel (PLAVIX) tablet 75 mg (has no administration in time range)  oxyCODONE-acetaminophen (PERCOCET) 7.5-325 MG per tablet 1 tablet (has no administration  in time range)  HYDROmorphone (DILAUDID) injection 1 mg (1 mg Intramuscular Given 06/26/20 1355)  oxyCODONE-acetaminophen (PERCOCET/ROXICET) 5-325 MG per tablet 2 tablet (2 tablets Oral Given 06/26/20 1738)    ED Course  I have reviewed the triage vital signs and the nursing notes.  Pertinent labs & imaging results that were available during my care of the patient were reviewed by me and considered in my medical decision making (see chart for details).    MDM Rules/Calculators/A&P                          I reviewed plain films obtained 06/24/2020 of hips bilaterally with and without pelvis in addition to DG lumbar spine which demonstrate sclerotic metastases at the left inferior pubic ramus, consistent with her localized area of discomfort.  There is also multilevel degenerative disc disease in lumbar spine with known sclerotic metastasis at L2.  No new changes in lumbar spine.  We will provide pain control here in the ED with 1 mg IM Dilaudid.  We will also call her SNF to determine what warranted transfer here to the ED as she is palliative care and given instruction to follow-up with her palliative care provider which she informs me is at [facility].  On subsequent evaluation, patient's pain was improved after receiving IM Dilaudid. She is resting comfortably and understands plan for follow-up with you. She acknowledges that she was there yesterday for vaccination. She denies any other symptoms at this time do not feel as though further work-up is warranted.  Spoke with [place] from [facility] skilled nursing facility in May, [place].  She denies any changes in patient's behavior and denies  any recent falls or new trauma.  Her nursing report was entirely benign.  She states that she did go to [facility] yesterday, however for vaccination.  Patient can be discharged with instructions to follow-up with palliative care provider at [facility].    Patient and/or family were informed that while patient is appropriate  for discharge at this time, some medical emergencies may only develop or become detectable after a period of time.  I specifically instructed patient and/or family to return to return to the ED or seek immediate medical attention for any new or worsening symptoms.  They were provided opportunity to ask any additional questions and have none at this time.  Prior to discharge patient is feeling well, agreeable with plan for discharge home.  They have expressed understanding of verbal discharge instructions as well as return precautions and are agreeable to the plan.   EDIT: At time of discharge, I was informed that her oncologist and palliative care specialist at [facility] are expecting patient and awaiting ED to ED transfer.  This was conveyed to nursing staff by family.  Patient was told that she needed to, however EMS would not bring her there.  I do did not want to fill out for transfer papers, so we will initiate.  Patient is medically stable and in no acute distress.  I spoke with Shauna from [facility] ED to ED transfer team was also that there are no beds available for any level ofcare.  She then transferred the phone call to Dr. Annabell Sabal, the on-call oncologist who would be able to admit patient.  She recognizes that there was a discussion about a possible ED to ED transfer to palliative care for ongoing management, however she states that there are no rooms available at this time and ED to ED transfer is not feasible.  She also states that she personally was not made aware of any transfer to an inpatient or out of bed.  She recommends that we adjust her Percocet medication to 10-325 mg and have patient and family follow-up with the palliative care team on the MyChart app for ongoing evaluation and management of her pain symptoms.  She has a video visit scheduled with palliative care for 07/03/2020.  I spoke with the sister, Fraser Din, who states that going home is no longer an option.  Evidently the patient was doing well  prior to her left-sided hip pain and so her rehab facility was thinking about ultimately discharging her, however changed their mind after she began to experiences any symptoms of discomfort and now her gait limitation.  According to the sister, patient's son told [facility] that they were dissatisfied with her progress and would rather have her admitted to [facility] for ongoing management.  They then signed away rights to her reserved bed.  On my reexamination of patient, she continues to endorse 9 out of 10 pain so Percocet was provided here in the ED.  I still cannot seem to get a hold of her son who is not answering his phone.    I consulted social work as well as PT will need to evaluate the patient.  Unfortunately, I cannot discharge the patient home as the family does not feel comfortable taking her.  Given that she is palliative care and may no longer have her bed available at [facility], do not feel as though she is safe for discharge at this time until it is confirmed that her place remains in [place].  I spoke with CSW Harrington  who spoke with the patient and daughter.  She is going to have a Investment banker, corporate in the morning to see if they can get her back to the rehab facility.  In the meantime, if the son wants to pick her up and take her to [facility] in times to have her hospitalized there, there would be no objection on our end.  PT can also evaluate the patient in the morning.     Final Clinical Impression(s) / ED Diagnoses Final diagnoses:  Left hip pain  Malignant neoplasm metastatic to bone [facility])    Rx / DC Orders ED Discharge Orders    None       Corena Herter, PA-C 06/26/20 1504    Corena Herter, PA-C 06/26/20 2129    Elnora Morrison, MD 06/27/20 [phone]

## 2020-06-26 NOTE — ED Notes (Signed)
Contact info given tot the EDP to update the sister.

## 2020-06-26 NOTE — ED Triage Notes (Signed)
Pt c/o of right hip pain chronically from a previous fracture. Now c/o of left hip pain. Denies any injuries.  Pain meds given in SNF. Xray was negative

## 2020-06-26 NOTE — Discharge Instructions (Addendum)
Your x-rays obtained 06/24/2020 show metastatic disease to left inferior pubic ramus (left hip) which is likely explaining your symptoms of pain.  Please take the newly prescribed Percocet 10-325 rather than your Percocet 5-325 every 3 hours for symptoms of pain. However, if they continued to be inadequate, you will need to follow-up with [facility] palliative care for ongoing evaluation and management.  You have a virtual visit scheduled for Tuesday.  You can also speak to your palliative care team at any time on the MyChart portal.  They should be your first point of contact for pain-related concerns.  Return to the ED or seek immediate medical attention should you experience any new or worsening symptoms.

## 2020-06-26 NOTE — Social Work (Signed)
[facility] CSW covering remotely, consulted with Krista Blue, PA to gather information.  CSW attempted to reach out to sister Fraser Din while she was in room with Pt to discuss planning options.   No answer left HIPAA compliant voicemail.   [name] MSW LCSWA Transitions of Care  Clinical Social Worker  [facility]  [phone]

## 2020-06-26 NOTE — Progress Notes (Signed)
[facility] CSW covering remotely received call from Pt's sister [name] @[phone].  Sister states that Pt was not going to be discharged from [facility] except that son wished to have Pt transferred to [facility] to seek care of palliative team.  Due to wishing to leave, and the cost associated with holding the bed, family gave up bed at [facility].  This CSW will update first shift CSW at [facility] that perhaps [name] might be willing to allow Pt's return.  Sister reports that family would like a palliative consult from [facility] team.   Sister will return to [place] tomorrow, states that son is expected to do so as well.   [name] MSW LCSWA Transitions of Care  Clinical Social Worker  [facility]  [phone]

## 2020-06-27 DIAGNOSIS — Z170 Estrogen receptor positive status [ER+]: Secondary | ICD-10-CM

## 2020-06-27 DIAGNOSIS — C50911 Malignant neoplasm of unspecified site of right female breast: Secondary | ICD-10-CM

## 2020-06-27 DIAGNOSIS — M25552 Pain in left hip: Secondary | ICD-10-CM | POA: Diagnosis not present

## 2020-06-27 DIAGNOSIS — G893 Neoplasm related pain (acute) (chronic): Secondary | ICD-10-CM | POA: Diagnosis not present

## 2020-06-27 DIAGNOSIS — Z515 Encounter for palliative care: Secondary | ICD-10-CM

## 2020-06-27 DIAGNOSIS — C7951 Secondary malignant neoplasm of bone: Principal | ICD-10-CM

## 2020-06-27 DIAGNOSIS — Z7189 Other specified counseling: Secondary | ICD-10-CM | POA: Diagnosis not present

## 2020-06-27 LAB — BASIC METABOLIC PANEL
Anion gap: 9 (ref 5–15)
BUN: 14 mg/dL (ref 8–23)
CO2: 27 mmol/L (ref 22–32)
Calcium: 8.6 mg/dL — ABNORMAL LOW (ref 8.9–10.3)
Chloride: 102 mmol/L (ref 98–111)
Creatinine, Ser: 0.62 mg/dL (ref 0.44–1.00)
GFR calc Af Amer: >60 mL/min (ref >60)
GFR calc non Af Amer: >60 mL/min (ref >60)
Glucose, Bld: 122 mg/dL — ABNORMAL HIGH (ref 70–99)
Potassium: 3.7 mmol/L (ref 3.5–5.1)
Sodium: 138 mmol/L (ref 135–145)

## 2020-06-27 LAB — CBC
HCT: 35.1 % — ABNORMAL LOW (ref 36.0–46.0)
Hemoglobin: 11 g/dL — ABNORMAL LOW (ref 12.0–15.0)
MCH: 31.7 pg (ref 26.0–34.0)
MCHC: 31.3 g/dL (ref 30.0–36.0)
MCV: 101.2 fL — ABNORMAL HIGH (ref 80.0–100.0)
Platelets: 217 10*3/uL (ref 150–400)
RBC: 3.47 MIL/uL — ABNORMAL LOW (ref 3.87–5.11)
RDW: 14.1 % (ref 11.5–15.5)
WBC: 6 10*3/uL (ref 4.0–10.5)
nRBC: 0 % (ref 0.0–0.2)

## 2020-06-27 LAB — PROTIME-INR
INR: 5.1 (ref 0.8–1.2)
Prothrombin Time: 45.6 seconds — ABNORMAL HIGH (ref 11.4–15.2)

## 2020-06-27 MED ORDER — ALBUTEROL SULFATE (2.5 MG/3ML) 0.083% IN NEBU: 3.0000 mL | INHALATION_SOLUTION | Freq: Four times a day (QID) | RESPIRATORY_TRACT | Status: DC | PRN

## 2020-06-27 MED ORDER — PREGABALIN 75 MG PO CAPS
150.0000 mg | ORAL_CAPSULE | Freq: Two times a day (BID) | ORAL | Status: DC
  Administered 2020-06-27 – 2020-06-28 (×2): 150 mg via ORAL
  Filled 2020-06-27 (×2): qty 2

## 2020-06-27 MED ORDER — OXYCODONE HCL 5 MG PO TABS
10.0000 mg | ORAL_TABLET | ORAL | Status: DC | PRN
  Administered 2020-06-28 (×3): 10 mg via ORAL
  Filled 2020-06-27 (×3): qty 2

## 2020-06-27 MED ORDER — SPIRONOLACTONE 25 MG PO TABS
12.5000 mg | ORAL_TABLET | Freq: Every day | ORAL | Status: DC
  Administered 2020-06-27 – 2020-06-28 (×2): 12.5 mg via ORAL
  Filled 2020-06-27: qty 0.5
  Filled 2020-06-27: qty 1
  Filled 2020-06-27 (×2): qty 0.5
  Filled 2020-06-27: qty 1

## 2020-06-27 MED ORDER — OXYCODONE-ACETAMINOPHEN 5-325 MG PO TABS
2.0000 | ORAL_TABLET | Freq: Every day | ORAL | Status: DC
  Administered 2020-06-27 – 2020-07-04 (×8): 2 via ORAL
  Filled 2020-06-27 (×8): qty 2

## 2020-06-27 MED ORDER — DEXAMETHASONE 4 MG PO TABS
4.0000 mg | ORAL_TABLET | Freq: Every day | ORAL | Status: DC
  Administered 2020-06-27 – 2020-06-28 (×2): 4 mg via ORAL
  Filled 2020-06-27 (×2): qty 1

## 2020-06-27 MED ORDER — POLYETHYLENE GLYCOL 3350 17 G PO PACK
17.0000 g | PACK | Freq: Once | ORAL | Status: AC
  Administered 2020-06-27: 17 g via ORAL
  Filled 2020-06-27: qty 1

## 2020-06-27 MED ORDER — ONDANSETRON HCL 4 MG/2ML IJ SOLN: 4.0000 mg | Freq: Four times a day (QID) | INTRAMUSCULAR | Status: DC | PRN

## 2020-06-27 MED ORDER — METHADONE HCL 5 MG PO TABS
5.0000 mg | ORAL_TABLET | Freq: Four times a day (QID) | ORAL | Status: DC
  Administered 2020-06-27 – 2020-07-05 (×31): 5 mg via ORAL
  Filled 2020-06-27 (×31): qty 1

## 2020-06-27 MED ORDER — SENNA 8.6 MG PO TABS
2.0000 | ORAL_TABLET | Freq: Two times a day (BID) | ORAL | Status: DC
  Administered 2020-06-29 – 2020-07-01 (×5): 17.2 mg via ORAL
  Administered 2020-07-01 – 2020-07-02 (×2): 8.6 mg via ORAL
  Administered 2020-07-02 – 2020-07-05 (×6): 17.2 mg via ORAL
  Filled 2020-06-27 (×16): qty 2

## 2020-06-27 MED ORDER — METHADONE HCL 5 MG PO TABS: 7.5000 mg | ORAL_TABLET | Freq: Three times a day (TID) | ORAL | Status: DC

## 2020-06-27 MED ORDER — TORSEMIDE 20 MG PO TABS
10.0000 mg | ORAL_TABLET | Freq: Every day | ORAL | Status: DC
  Administered 2020-06-27 – 2020-06-28 (×2): 10 mg via ORAL
  Filled 2020-06-27 (×2): qty 1

## 2020-06-27 MED ORDER — ACETAMINOPHEN 325 MG PO TABS
650.0000 mg | ORAL_TABLET | Freq: Three times a day (TID) | ORAL | Status: DC
  Administered 2020-06-27 – 2020-07-05 (×24): 650 mg via ORAL
  Filled 2020-06-27 (×25): qty 2

## 2020-06-27 MED ORDER — ONDANSETRON HCL 4 MG PO TABS: 4.0000 mg | ORAL_TABLET | Freq: Four times a day (QID) | ORAL | Status: DC | PRN

## 2020-06-27 NOTE — ED Notes (Signed)
Pt placed on bedpan

## 2020-06-27 NOTE — ED Notes (Signed)
PT at bedside.

## 2020-06-27 NOTE — H&P (Signed)
History and Physical    Stacey [name] UQJ:[id number] DOB: [birth year] DOA: 06/26/2020  PCP: Patient, No Pcp Per   Patient coming from: [facility]  Chief Complaint: Left hip pain  HPI: Stacey [name] is a 74 y.o. female with medical history significant for metastatic breast cancer, CAD, paroxysmal atrial fibrillation, COPD, and morbid obesity who presented to the ED with worsening left hip pain, despite taking her methadone 3 times daily and oxycodone 10 mg every 3 hours as needed for breakthrough pain.  This has been ongoing for the last several days and she finds it very difficult to ambulate or perform any activities of daily living on account of her severe pain.  She denies any new trauma, numbness/weakness, fevers or chills, chest pain, shortness of breath, nausea, vomiting, or diarrhea.  She continues to follow-up at [facility] with her oncologist for ongoing cancer treatments and would like to be able to make it there for further discussion of ongoing treatment and also discussed with the palliative care team at that facility.  Patient currently does not have a safe discharge plan as family cannot care for her at home and she appears to have lost her bed at [facility].   ED Course: Vital signs stable and patient is afebrile.  Patient has been seen by palliative care with plans for further pain control pending with possible need for steroids.  Review of Systems: All others reviewed and otherwise negative except as noted above.  Past Medical History:  Diagnosis Date  . CAD in native artery    a. DES to ramus 2005 with late stent thrombosis 2006 tx with PTCA. 12/18 PCI/DES x1 to mRCA, EF 50-55%  . CHF (congestive heart failure) ([place])   . Chronic pain   . COPD (chronic obstructive pulmonary disease) ([place])   . Hyperlipidemia   . Hypertension   . Left bundle branch block   . Lymphedema   . Metastatic breast cancer ([place])    a. to bone.  . MI (myocardial infarction) ([place])   . Mild aortic  stenosis 10/2017  . Morbid obesity ([place])   . PAF (paroxysmal atrial fibrillation) ([place])   . PSVT (paroxysmal supraventricular tachycardia) ([place])    a. per [facility] notes, seen on event monitor in 2014.  . Pulmonary nodules     Past Surgical History:  Procedure Laterality Date  . CORONARY STENT INTERVENTION N/A 11/26/2017   Procedure: CORONARY STENT INTERVENTION;  Surgeon: Martinique, Peter M, MD;  Location: [facility];  Service: Cardiovascular;  Laterality: N/A;  . CORONARY STENT PLACEMENT    . LEFT HEART CATH AND CORONARY ANGIOGRAPHY N/A 11/26/2017   Procedure: LEFT HEART CATH AND CORONARY ANGIOGRAPHY;  Surgeon: Martinique, Peter M, MD;  Location: [facility];  Service: Cardiovascular;  Laterality: N/A;     reports that she quit smoking about 39 years ago. Her smoking use included cigarettes. She has a 36.00 pack-year smoking history. She has never used smokeless tobacco. She reports that she does not drink alcohol and does not use drugs.  Allergies  Allergen Reactions  . Brilinta [Ticagrelor] Other (See Comments)    Nausea and severe diarrhea- general weakness. Patient says does not want to take again  . Albuterol Other (See Comments)    Heart racing     Family History  Problem Relation Age of Onset  . CAD Father 40  . Heart attack Father   . COPD Sister   . CAD Paternal Grandmother   .  Sudden Cardiac Death Neg Hx     Prior to Admission medications   Medication Sig Start Date End Date Taking? Authorizing Provider  acetaminophen (TYLENOL) 500 MG tablet Take 2 tablets (1,000 mg total) by mouth in the morning, at noon, and at bedtime. 04/26/20   Johnson, Clanford L, MD  albuterol (ACCUNEB) 0.63 MG/3ML nebulizer solution Take 1 ampule by nebulization every 6 (six) hours as needed for shortness of breath (summer time.).    [provider]  atorvastatin (LIPITOR) 80 MG tablet Take 1 tablet (80 mg total) by mouth daily. 05/28/20   Arnoldo Lenis, MD  b complex  vitamins tablet Take 1 tablet by mouth every morning.     [provider]  calcitonin, salmon, (MIACALCIN/FORTICAL) 200 UNIT/ACT nasal spray Place 1 spray into alternate nostrils daily. 04/27/20   Johnson, Clanford L, MD  Calcium Citrate-Vitamin D (CITRACAL MAXIMUM) 315-250 MG-UNIT TABS Take 2 tablets by mouth daily.     [provider]  clopidogrel (PLAVIX) 75 MG tablet Take 1 tablet (75 mg total) by mouth daily. 03/28/20   Arnoldo Lenis, MD  DULoxetine (CYMBALTA) 30 MG capsule Take 30 mg by mouth daily.    [provider]  DULoxetine (CYMBALTA) 60 MG capsule Take 1 capsule by mouth daily. 08/01/19   [provider]  letrozole (FEMARA) 2.5 MG tablet Take 2.5 mg by mouth at bedtime.     [provider]  lidocaine-prilocaine (EMLA) cream Apply 1 application topically daily as needed (knee pain).  06/28/19   [provider]  losartan (COZAAR) 100 MG tablet Take 1 tablet (100 mg total) by mouth daily. 03/25/19   Arnoldo Lenis, MD  methadone (DOLOPHINE) 5 MG tablet Take 5 mg by mouth 3 (three) times daily.    [provider]  metoprolol succinate (TOPROL-XL) 100 MG 24 hr tablet TAKE 1 AND 1/2 TABLETS DAILY 03/28/20   Arnoldo Lenis, MD  Multiple Vitamins-Minerals (EQ VISION FORMULA 50+ PO) Take 1 tablet by mouth every morning.    [provider]  Multiple Vitamins-Minerals (HAIR/SKIN/NAILS) CAPS Take 1 capsule by mouth every morning.    [provider]  nitroGLYCERIN (NITROSTAT) 0.4 MG SL tablet Place 1 tablet (0.4 mg total) under the tongue every 5 (five) minutes x 3 doses as needed for chest pain (if no relief after 3rd dose, proceed to the ED or call 911). 03/22/20   Arnoldo Lenis, MD  oxycodone (OXY-IR) 5 MG capsule Take 10 mg by mouth every 3 (three) hours as needed.    [provider]  oxyCODONE-acetaminophen (PERCOCET/ROXICET) 5-325 MG tablet Take 1 tablet by mouth every 3 (three) hours. 06/24/20    Evalee Jefferson, PA-C  Potassium 99 MG TABS Take 2 tablets by mouth every morning.     [provider]  pregabalin (LYRICA) 100 MG capsule Take 1 capsule by mouth at bedtime.  08/01/19   [provider]  spironolactone (ALDACTONE) 25 MG tablet TAKE 1/2 TABLET(12.5 MG) BY MOUTH DAILY 03/28/20   Arnoldo Lenis, MD  torsemide (DEMADEX) 20 MG tablet Take 10 mg by mouth daily.    [provider]  Ubiquinol (QUNOL COQ10/UBIQUINOL/MEGA) 100 MG CAPS Take 1 capsule by mouth every morning. QUNOL-Ultra COQ10    [provider]  warfarin (COUMADIN) 1 MG tablet Take 2 tablets daily or as directed by [facility] 04/29/20   Murlean Iba, MD    Physical Exam: Vitals:   06/27/20 0644 06/27/20 0800 06/27/20 1122  06/27/20 1552  BP: (!) 158/86 (!) 167/81 (!) 152/71 (!) 154/64  Pulse: 88 62 74 71  Resp:   18 20  Temp:   98.2 F (36.8 C) 98.3 F (36.8 C)  TempSrc:   Oral Oral  SpO2: 96% 91% 98% 100%    Constitutional: NAD, calm, comfortable, obese Vitals:   06/27/20 0644 06/27/20 0800 06/27/20 1122 06/27/20 1552  BP: (!) 158/86 (!) 167/81 (!) 152/71 (!) 154/64  Pulse: 88 62 74 71  Resp:   18 20  Temp:   98.2 F (36.8 C) 98.3 F (36.8 C)  TempSrc:   Oral Oral  SpO2: 96% 91% 98% 100%   Eyes: lids and conjunctivae normal ENMT: Mucous membranes are moist.  Neck: normal, supple Respiratory: clear to auscultation bilaterally. Normal respiratory effort. No accessory muscle use.  Cardiovascular: Regular rate and rhythm, no murmurs. No extremity edema. Abdomen: no tenderness, no distention. Bowel sounds positive.  Musculoskeletal:  No joint deformity upper and lower extremities.   Skin: no rashes, lesions, ulcers.  Psychiatric: Flat affect  Labs on Admission: I have personally reviewed following labs and imaging studies  CBC: Recent Labs  Lab 06/27/20 1422  WBC 6.0  HGB 11.0*  HCT 35.1*  MCV 101.2*  PLT 454   Basic Metabolic Panel: Recent Labs    Lab 06/27/20 1422  NA 138  K 3.7  CL 102  CO2 27  GLUCOSE 122*  BUN 14  CREATININE 0.62  CALCIUM 8.6*   GFR: Estimated Creatinine Clearance: 63.6 mL/min (by C-G formula based on SCr of 0.62 mg/dL). Liver Function Tests: No results for input(s): AST, ALT, ALKPHOS, BILITOT, PROT, ALBUMIN in the last 168 hours. No results for input(s): LIPASE, AMYLASE in the last 168 hours. No results for input(s): AMMONIA in the last 168 hours. Coagulation Profile: Recent Labs  Lab 06/27/20 1422  INR 5.1*   Cardiac Enzymes: No results for input(s): CKTOTAL, CKMB, CKMBINDEX, TROPONINI in the last 168 hours. BNP (last 3 results) No results for input(s): PROBNP in the last 8760 hours. HbA1C: No results for input(s): HGBA1C in the last 72 hours. CBG: No results for input(s): GLUCAP in the last 168 hours. Lipid Profile: No results for input(s): CHOL, HDL, LDLCALC, TRIG, CHOLHDL, LDLDIRECT in the last 72 hours. Thyroid Function Tests: No results for input(s): TSH, T4TOTAL, FREET4, T3FREE, THYROIDAB in the last 72 hours. Anemia Panel: No results for input(s): VITAMINB12, FOLATE, FERRITIN, TIBC, IRON, RETICCTPCT in the last 72 hours. Urine analysis:    Component Value Date/Time   COLORURINE AMBER (A) 04/24/2020 1317   APPEARANCEUR HAZY (A) 04/24/2020 1317   LABSPEC 1.044 (H) 04/24/2020 1317   PHURINE 6.0 04/24/2020 1317   GLUCOSEU NEGATIVE 04/24/2020 1317   HGBUR NEGATIVE 04/24/2020 1317   BILIRUBINUR NEGATIVE 04/24/2020 1317   KETONESUR NEGATIVE 04/24/2020 1317   PROTEINUR NEGATIVE 04/24/2020 1317   NITRITE NEGATIVE 04/24/2020 1317   LEUKOCYTESUR NEGATIVE 04/24/2020 1317    Radiological Exams on Admission: DG Hip Unilat With Pelvis 2-3 Views Left  Result Date: 06/26/2020 CLINICAL DATA:  Increasing pelvic pain common no known injury, initial encounter EXAM: DG HIP (WITH OR WITHOUT PELVIS) 3V LEFT COMPARISON:  None. FINDINGS: Pelvic ring is intact. Mild degenerative changes of the hip  joints are noted bilaterally. No acute fracture or dislocation is seen. No soft tissue abnormality is noted. IMPRESSION: Degenerative change without acute abnormality. Electronically Signed   By: Inez Catalina M.D.   On: 06/26/2020 22:21    Assessment/Plan Active Problems:  Pain of metastatic malignancy    Left hip pain secondary to metastatic breast cancer -Noted new sclerotic lesion to left pubic ramus -Appears to be quite debilitating and patient cannot ambulate, nor perform ADLs -Safe discharge plan not present as patient has lost her SNF bed and family cannot care for her at home -No acute findings on imaging studies -Appreciate palliative consultation with pain management recommendations to follow -Plan to control pain and then discharge so that patient could transport to [facility] for further evaluation  History of paroxysmal atrial fibrillation with supratherapeutic INR -Currently rate controlled -Continue metoprolol -Coumadin per pharmacy which will need to be held on account of INR elevation -No signs of bleeding noted  History of systolic and diastolic CHF -Appears euvolemic -Last 2D echocardiogram on 03/2020 with LVEF 35-40% with grade 1 diastolic dysfunction -Continue torsemide and spironolactone -Monitor daily weights  History of hypertension-controlled -Continue current doses of losartan and metoprolol -Continue torsemide and spironolactone as noted above  History of CAD -Continue atorvastatin and Plavix as well as metoprolol  Obesity -Lifestyle changes  DVT prophylaxis: Currently on Coumadin, but supratherapeutic Code Status: Full code Family Communication: Sister, Stacey [name] at bedside Disposition Plan: Admit for pain control with assistance of palliative care. Consults called: Palliative care Admission status: Observation, MedSurg   Ozzie Remmers D Constantina Laseter DO [facility]  If 7PM-7AM, please contact night-coverage [email or web address]  06/27/2020, 4:13 PM

## 2020-06-27 NOTE — Clinical Social Work Note (Signed)
CSW following up with consult from previous night. CSW called [employer] and spoke with Dedra Skeens, the Mudlogger of nursing, and Helene Kelp, the Education officer, museum, regarding the patient being brought to the ED. Per Dedra Skeens, the patient's last covered day for rehab at SNF was yesterday and the patient was going to be discharged today as the family could not afford copay days. Sana reported the patient was supposed to follow up with palliative at [facility], but the patient was reportedly in too much pain to tolerate the car ride to her appointment so patient was brought into the ED. Dedra Skeens and Helene Kelp expressed concerns about patient's continual decline since 06/21/20 and issues with controlling the patient's pain. Sana reported the family communicated to [facility] the patient needed to be transported to [facility] and would need a POC for non-emergency transport.  CSW called patient's son, Marissa Nestle, to get information for [facility] palliative. Per son, the patient was to be transferred to [facility] yesterday, but the transportation was not set up. Son reported that patient's pain is so severe, she cannot sit in a car to drive to [facility] and will need to go by stretcher.  CSW called [facility] palliative and was referred to the transfer center. CSW was informed the patient was declined a bed at [facility] yesterday afternoon as all 3 hospitals are at max capacity; the patient was not placed on the waitlist as palliative was not going to make any changes to the patient's plan. CSW updated EDP and palliative NP.  CSW met with the patient and her sister, Fraser Din, in the ED. CSW explained patient was not approved for a hospital transfer due to no beds being available at all 3 [place] and a long waitlist. Patient and sister verbalized understanding that [facility] palliative will not make any changes at this time. CSW also explained that patient has now entered copay days for SNF, so if the family cannot afford this, HH would be the next available option. CSW explained patient  does not have a transportation benefit with Medicare A/B and would be required to pay per mile for non-emergency transportation. CSW recommended that family discuss patient's palliative options in case patient continues to experience too much pain to be able to be transported via car.  CSW was unable to reach patient's son. CSW left voicemail regarding lack of bed availability at [facility].  Tobi Bastos, LCSW Transitions of Care Clinical Social Worker [facility] Ph: [phone]

## 2020-06-27 NOTE — Progress Notes (Signed)
ANTICOAGULATION CONSULT NOTE - Initial Up Consult   Pharmacy Consult for warfarin dosing  Indication: atrial fibrillation   Allergies  Allergen Reactions   Brilinta [Ticagrelor] Other (See Comments)    Nausea and severe diarrhea- general weakness. Patient says does not want to take again   Albuterol Other (See Comments)    Heart racing       Patient Measurements:  There is no height or weight on file to calculate BMI. Stacey [name]               Temp: 98.3 F (36.8 C) (07/28 1552) Temp Source: Oral (07/28 1552) BP: 154/64 (07/28 1552) Pulse Rate: 71 (07/28 1552)  Labs: Recent Labs    06/27/20 1422  HGB 11.0*  HCT 35.1*  PLT 217  LABPROT 45.6*  INR 5.1*  CREATININE 0.62    Estimated Creatinine Clearance: 63.6 mL/min (by C-G formula based on SCr of 0.62 mg/dL).     Medications:  Medications Prior to Admission  Medication Sig Dispense Refill Last Dose   acetaminophen (TYLENOL) 500 MG tablet Take 2 tablets (1,000 mg total) by mouth in the morning, at noon, and at bedtime. 30 tablet 0    albuterol (ACCUNEB) 0.63 MG/3ML nebulizer solution Take 1 ampule by nebulization every 6 (six) hours as needed for shortness of breath (summer time.).      atorvastatin (LIPITOR) 80 MG tablet Take 1 tablet (80 mg total) by mouth daily. 90 tablet 2    b complex vitamins tablet Take 1 tablet by mouth every morning.       calcitonin, salmon, (MIACALCIN/FORTICAL) 200 UNIT/ACT nasal spray Place 1 spray into alternate nostrils daily. 3.7 mL 0    Calcium Citrate-Vitamin D (CITRACAL MAXIMUM) 315-250 MG-UNIT TABS Take 2 tablets by mouth daily.       clopidogrel (PLAVIX) 75 MG tablet Take 1 tablet (75 mg total) by mouth daily. 90 tablet 2    DULoxetine (CYMBALTA) 30 MG capsule Take 30 mg by mouth daily.      DULoxetine (CYMBALTA) 60 MG capsule Take 1 capsule by mouth daily.      letrozole (FEMARA) 2.5 MG tablet Take 2.5 mg by mouth at bedtime.       lidocaine-prilocaine (EMLA)  cream Apply 1 application topically daily as needed (knee pain).       losartan (COZAAR) 100 MG tablet Take 1 tablet (100 mg total) by mouth daily. 90 tablet 3    methadone (DOLOPHINE) 5 MG tablet Take 5 mg by mouth 3 (three) times daily.      metoprolol succinate (TOPROL-XL) 100 MG 24 hr tablet TAKE 1 AND 1/2 TABLETS DAILY 135 tablet 2    Multiple Vitamins-Minerals (EQ VISION FORMULA 50+ PO) Take 1 tablet by mouth every morning.      Multiple Vitamins-Minerals (HAIR/SKIN/NAILS) CAPS Take 1 capsule by mouth every morning.      nitroGLYCERIN (NITROSTAT) 0.4 MG SL tablet Place 1 tablet (0.4 mg total) under the tongue every 5 (five) minutes x 3 doses as needed for chest pain (if no relief after 3rd dose, proceed to the ED or call 911). 25 tablet 3    oxycodone (OXY-IR) 5 MG capsule Take 10 mg by mouth every 3 (three) hours as needed.      oxyCODONE-acetaminophen (PERCOCET/ROXICET) 5-325 MG tablet Take 1 tablet by mouth every 3 (three) hours. 6 tablet 0    Potassium 99 MG TABS Take 2 tablets by mouth every morning.       pregabalin (  LYRICA) 100 MG capsule Take 1 capsule by mouth at bedtime.       spironolactone (ALDACTONE) 25 MG tablet TAKE 1/2 TABLET(12.5 MG) BY MOUTH DAILY 45 tablet 2    torsemide (DEMADEX) 20 MG tablet Take 10 mg by mouth daily.      Ubiquinol (QUNOL COQ10/UBIQUINOL/MEGA) 100 MG CAPS Take 1 capsule by mouth every morning. QUNOL-Ultra COQ10      warfarin (COUMADIN) 1 MG tablet Take 2 tablets daily or as directed by [facility] 100 tablet 4    Scheduled:   atorvastatin  80 mg Oral Daily   B-complex with vitamin C  1 tablet Oral q morning - 10a   calcitonin (salmon)  1 spray Alternating Nares Daily   calcium-vitamin D  1 tablet Oral Daily   clopidogrel  75 mg Oral Daily   dexamethasone  4 mg Oral Daily   DULoxetine  30 mg Oral Daily   letrozole  2.5 mg Oral QHS   losartan  100 mg Oral Daily   methadone  7.5 mg Oral TID   metoprolol succinate  100  mg Oral Daily   pregabalin  100 mg Oral QHS   Infusions:   PRN: oxyCODONE-acetaminophen **AND** oxyCODONE Anti-infectives (From admission, onward)   None      Goal of Therapy:  INR 2-3 Monitor platelets by anticoagulation protocol: Yes    Prior to Admission Warfarin Dosing:  Stacey [name] takes 2mg  of warfarin daily     Admit INR was 5.1 Lab Results  Component Value Date   INR 5.1 (HH) 06/27/2020   INR 4.2 (HH) 04/26/2020   INR 3.9 (H) 04/25/2020    Assessment: Stacey [name] a 74 y.o. female requires anticoagulation with warfarin for the indication of  atrial fibrillation. Warfarin will be initiated inpatient following pharmacy protocol per pharmacy consult. Patient most recent blood work is as follows: CBC Latest Ref Rng & Units 06/27/2020 04/26/2020 04/24/2020  WBC 4.0 - 10.5 K/uL 6.0 4.6 5.0  Hemoglobin 12.0 - 15.0 g/dL 11.0(L) 11.6(L) 11.3(L)  Hematocrit 36 - 46 % 35.1(L) 36.9 34.8(L)  Platelets 150 - 400 K/uL 217 189 189     Plan: Hold warfarin tonight   Monitor CBC MWF with am labs   Monitor INR daily Monitor for signs and symptoms of bleeding   Stacey [name] Torence Palmeri, PharmD, MBA, BCGP Clinical Pharmacist

## 2020-06-27 NOTE — Progress Notes (Signed)
CRITICAL VALUE ALERT  Critical Value:  INR 5.1  Date & Time Notied:  06/27/20 @ 8527  Provider Notified: Dr. Manuella Ghazi  Orders Received/Actions taken: will hold coumadin per pharmacy protocol and recheck pt/inr in the morning. Deirdre Pippins, RN

## 2020-06-27 NOTE — ED Provider Notes (Signed)
  Physical Exam  BP (!) 152/71 (BP Location: Left Wrist)   Pulse 74   Temp 98.2 F (36.8 C) (Oral)   Resp 18   SpO2 98%   Physical Exam  ED Course/Procedures     Procedures  MDM   Social work and palliative team have seen the patient.  Ultimately, it appears that patient will need admission to the hospital for pain control.  Palliative service has seen the patient and it appears that she was doing well until the pain became severe for her very she is unable to function.  They informed me that they will try to get patient's pain and better control so that she can follow-up with her palliative service at [facility] if needed.  I discussed with the APP from palliative service if they were quite sure that patient will be able to be discharged tomorrow, and they agreed.  At this time hospitalist consult has been made -admission will be for severe pain/intractable pain secondary to cancer.      Varney Biles, MD 06/27/20 1353

## 2020-06-27 NOTE — Evaluation (Addendum)
Physical Therapy Evaluation Patient Details Name: Stacey [name] MRN: [id number] DOB: 03/03/1946 Today's Date: 06/27/2020   History of Present Illness  Stacey [name] is a 74 y.o. female with past medical history significant for chronic pain intolerant of care in the setting of metastatic breast cancer who presents to the ED from her skilled nursing facility with complaints of left hip pain.  Reviewed patient's medical record and she was evaluated on 06/24/2020 for same complaints.  Patient takes methadone 3 times daily for her pain with oxycodone 10 mg every 3 hours as needed for additional relief.  Patient was given IV Dilaudid in the ED before ultimately being discharged home with Percocet 5-325 mg scheduled every 3 hours in addition to the methadone 3 times daily.  Plan was for her to follow-up with her primary care provider or pain clinic for further management.  On my examination, patient states that she had no idea that her metastatic breast cancer had any relation to her current left hip pain symptoms.  She states that she was unclear as to her outpatient follow-up instructions at time of discharge from last ED encounter, however I personally reviewed the AVS which clearly delineated instructions and plan.  Patient is alert and oriented as well as answering questions appropriately, however her memory of the encounter is blurred.  She states that she would not have come back in if she knew that there was a probably metastatic lesion seen on her left inferior pubic ramus that is likely contributing to her several day hx of left hip pain.  Patient states that it is difficult for her to ambulate due to her pain symptoms.  She denies any new trauma, falls, numbness or weakness, fevers or chills, or other symptoms.    Clinical Impression  The patient had severe pain in left hip limiting physical therapy treatment today. The patient was able to perform basic bed mobility with severe pain present.   Patient unable to tolerate long sitting or attempting out of bed activities due to severe back pain.  The patient and patient's bed was repositioned to decrease weight on the spine and hips. The patient was left in the bed with the call button within arm's reach with a family member present. PLAN: The patient will continue to benefit from skilled physical therapy services in order to improve mechanics in bed mobility to increase independence while decreasing pain during functional activities.     Follow Up Recommendations No PT follow up    Equipment Recommendations   None Recommended   Recommendations for Other Services       Precautions / Restrictions Precautions Precautions: Fall Restrictions Weight Bearing Restrictions: No      Mobility  Bed Mobility Overal bed mobility: Needs Assistance Bed Mobility: Rolling Rolling: Modified independent (Device/Increase time)         General bed mobility comments: limited mainly due to pain  Transfers                  Ambulation/Gait                Stairs            Wheelchair Mobility    Modified Rankin (Stroke Patients Only)       Balance  Pertinent Vitals/Pain Pain Assessment: Faces Pain Score: 9  Faces Pain Scale: Hurts worst Pain Location:  (left hip) Pain Intervention(s): Limited activity within patient's tolerance;Monitored during session;Premedicated before session;Repositioned    Home Living Family/patient expects to be discharged to:: Private residence Living Arrangements: Alone Available Help at Discharge: Family;Available PRN/intermittently Type of Home: House Home Access: Stairs to enter Entrance Stairs-Rails: None Entrance Stairs-Number of Steps: 1 Home Layout: One level Home Equipment: Walker - 2 wheels;Walker - standard;Cane - single point;Shower seat;Toilet riser      Prior Function Level of Independence: Needs  assistance               Hand Dominance        Extremity/Trunk Assessment        Lower Extremity Assessment Lower Extremity Assessment: Overall WFL for tasks assessed       Communication   Communication: No difficulties  Cognition Arousal/Alertness: Suspect due to medications Behavior During Therapy: WFL for tasks assessed/performed Overall Cognitive Status: Within Functional Limits for tasks assessed                                        General Comments      Exercises     Assessment/Plan    PT Assessment Patient needs continued PT services  PT Problem List Decreased activity tolerance;Decreased mobility;Pain       PT Treatment Interventions DME instruction;Functional mobility training;Therapeutic activities;Patient/family education;Modalities    PT Goals (Current goals can be found in the Care Plan section)  Acute Rehab PT Goals Patient Stated Goal:  (back to home) PT Goal Formulation: With patient/family Time For Goal Achievement: 07/04/20 Potential to Achieve Goals: Fair    Frequency Min 3X/week   Barriers to discharge        Co-evaluation               AM-PAC PT "6 Clicks" Mobility  Outcome Measure Help needed turning from your back to your side while in a flat bed without using bedrails?: A Lot Help needed moving from lying on your back to sitting on the side of a flat bed without using bedrails?: Total Help needed moving to and from a bed to a chair (including a wheelchair)?: Total Help needed standing up from a chair using your arms (e.g., wheelchair or bedside chair)?: Total Help needed to walk in hospital room?: Total Help needed climbing 3-5 steps with a railing? : Total 6 Click Score: 7    End of Session Equipment Utilized During Treatment: Other (comment) (bed sheet for pt repositioning) Activity Tolerance: Patient limited by pain Patient left: in bed;with call bell/phone within reach;with family/visitor  present Nurse Communication: Mobility status;Precautions;Weight bearing status;Other (comment) (pt discharge options based on current status) PT Visit Diagnosis: Other abnormalities of gait and mobility (R26.89);Muscle weakness (generalized) (M62.81);Pain Pain - Right/Left: Left Pain - part of body: Hip    Time: 2706-2376 PT Time Calculation (min) (ACUTE ONLY): 22 min   Charges:   PT Evaluation $PT Eval Moderate Complexity: 1 Mod PT Treatments $Therapeutic Activity: 8-22 mins        3:16 PM , 06/27/20 Karlyn Agee, SPT Physical Therapy with [facility] [phone] office   During this treatment session, the therapist was present, participating in and directing the treatment.  3:16 PM, 06/27/20 Lonell Grandchild, MPT Physical Therapist with [facility] [phone] office  [place] mobile phone

## 2020-06-27 NOTE — Consult Note (Signed)
Consultation Note Date: 06/27/2020   Patient Name: Stacey [name]  DOB: 07-18-1946  MRN: [id number]  Age / Sex: 74 y.o., female  PCP: Patient, No Pcp Per Referring Physician: Rodena Goldmann, DO  Reason for Consultation: Establishing goals of care and Pain control  HPI/Patient Profile:74 y.o.femalewith past medical history of chronic back pain, CHF, CAD, COPD,renal mass concerning for renal carcinoma on observation,R breast cancer on fulvestrant with mets to L1, L1 s/p rad tx, last visit to Oncologist noted 7/19 with "stable cancer". Recent admission and discharge to SNF now presenting to ED for disabling pain in the L hip/groin area. Noted xray on 7/25 showing sclerotic lesion on "L interior pubic ramus, corresponding to a sclerotic region on CT likely a sclerotic metastasis". Palliative medicine consulted for symptom management and goals of care.    Clinical Assessment and Goals of Care: "Stacey [name]" is familiar to me from her last stay at [facility]. I evaluated her and met with her and her sister Stacey [name] in the emergency department, and again later in the day in her room. I also spoke with her son, Stacey [name] by phone.   She is having pain in her L low back and groin area. It feels like a sledgehammer, worsens with movement, better with rest. She was having relief on her previous pain regimen and was doing well at the nursing facility prior to Friday. There was no trauma or fall. She was looking forward to discharging home from the SNF but became worried when the pain was worse and she was debilitated. She worries about sleeping through the night without taking pain medication because then she wakes up in pain. However, there is also concern about her being too sleepy during the day.   I reviewed her pain regimen and reached out to her Palliative team at [facility]. Dr. Landry Mellow graciously discussed her case with me  and made recommendations for medication changes and recommended referral for radiation consult for possible palliative radiation to her pelvis for pain control.    After discussion with radiation team in [place], her son, and again with patient and her sister- we decided careplan that best aligns with her goals is to attempt to achieve pain control that restores her to the functional state that she was in on Friday that would allow her to be transported to [place] by vehicle for an outpatient consult with her Oncology and Palliative team there.   I have also scheduled an appointment with patient and her son for further goals of care and advanced care planning discussion.   Primary Decision Maker PATIENT- with assistance from her son, Stacey [name]    SUMMARY OF RECOMMENDATIONS -Symptom management-  -Increase methadone to 67m QID (ideally would like to increase it to 7.555mTID- however due to the availability of medication preparation - we only have 1021methadone tablets in stock and no liquid- will start with the 5mg75mD)  -Start dexamethasone 4mg 8mly  -I have adjusted her Lyrica dose to reflect the dosing that is in  her [facility] chart- Lyrica 19m Q12hr  -Continue oxycodone 148mq3hr PRN for breakthrough pain  -Percocet 5/325 2tabs QHS to help with breakthrough pain through the night while she sleeps- we discussed it is important for her to get a full night of sleep for good pain control  -Recommend she takes PRN oxycodone first thing on waking and about 20-30 minutes before any anticipated activity such as PT or a car ride  -[facility]  -Senna 2 po BID for bowel propylaxis  Code Status/Advance Care Planning:  Full code  Palliative Prophylaxis:   Frequent Pain Assessment  Additional Recommendations (Limitations, Scope, Preferences):  Full Scope Treatment  Prognosis:    Unable to determine  Discharge Planning: Home with Home Health  Primary  Diagnoses: Present on Admission: . Pain of metastatic malignancy   I have reviewed the medical record, interviewed the patient and family, and examined the patient. The following aspects are pertinent.  Past Medical History:  Diagnosis Date  . CAD in native artery    a. DES to ramus 2005 with late stent thrombosis 2006 tx with PTCA. 12/18 PCI/DES x1 to mRCA, EF 50-55%  . CHF (congestive heart failure) ([facility]  . Chronic pain   . COPD (chronic obstructive pulmonary disease) ([facility]  . Hyperlipidemia   . Hypertension   . Left bundle branch block   . Lymphedema   . Metastatic breast cancer ([facility]   a. to bone.  . MI (myocardial infarction) ([facility]  . Mild aortic stenosis 10/2017  . Morbid obesity ([facility]  . PAF (paroxysmal atrial fibrillation) ([facility]  . PSVT (paroxysmal supraventricular tachycardia) ([facility]   a. per [facility] notes, seen on event monitor in 2014.  . Pulmonary nodules    Social History   Socioeconomic History  . Marital status: Single    Spouse name: Not on file  . Number of children: Not on file  . Years of education: Not on file  . Highest education level: Not on file  Occupational History  . Occupation: Retired  Tobacco Use  . Smoking status: Former Smoker    Packs/day: 2.00    Years: 18.00    Pack years: 36.00    Types: Cigarettes    Quit date: 12/01/1980    Years since quitting: 39.5  . Smokeless tobacco: Never Used  Vaping Use  . Vaping Use: Never used  Substance and Sexual Activity  . Alcohol use: No  . Drug use: No  . Sexual activity: Not on file  Other Topics Concern  . Not on file  Social History Narrative  . Not on file   Social Determinants of Health   Financial Resource Strain:   . Difficulty of Paying Living Expenses:   Food Insecurity:   . Worried About RuCharity fundraisern the Last Year:   . RaArboriculturistn the Last Year:   Transportation Needs:   . LaFilm/video editorMedical):   . Marland Kitchenack of Transportation (Non-Medical):     Physical Activity:   . Days of Exercise per Week:   . Minutes of Exercise per Session:   Stress:   . Feeling of Stress :   Social Connections:   . Frequency of Communication with Friends and Family:   . Frequency of Social Gatherings with Friends and Family:   . Attends Religious Services:   . Active Member of Clubs or Organizations:   . Attends Club or  Organization Meetings:   Marland Kitchen Marital Status:    Family History  Problem Relation Age of Onset  . CAD Father 23  . Heart attack Father   . COPD Sister   . CAD Paternal Grandmother   . Sudden Cardiac Death Neg Hx    Scheduled Meds: . atorvastatin  80 mg Oral Daily  . B-complex with vitamin C  1 tablet Oral q morning - 10a  . calcitonin (salmon)  1 spray Alternating Nares Daily  . calcium-vitamin D  1 tablet Oral Daily  . clopidogrel  75 mg Oral Daily  . dexamethasone  4 mg Oral Daily  . DULoxetine  30 mg Oral Daily  . letrozole  2.5 mg Oral QHS  . losartan  100 mg Oral Daily  . methadone  7.5 mg Oral TID  . metoprolol succinate  100 mg Oral Daily  . pregabalin  100 mg Oral QHS   Continuous Infusions: PRN Meds:.oxyCODONE-acetaminophen **AND** oxyCODONE Medications Prior to Admission:  Prior to Admission medications   Medication Sig Start Date End Date Taking? Authorizing Provider  acetaminophen (TYLENOL) 500 MG tablet Take 2 tablets (1,000 mg total) by mouth in the morning, at noon, and at bedtime. 04/26/20   Johnson, Clanford L, MD  albuterol (ACCUNEB) 0.63 MG/3ML nebulizer solution Take 1 ampule by nebulization every 6 (six) hours as needed for shortness of breath (summer time.).    [provider]  atorvastatin (LIPITOR) 80 MG tablet Take 1 tablet (80 mg total) by mouth daily. 05/28/20   Arnoldo Lenis, MD  b complex vitamins tablet Take 1 tablet by mouth every morning.     [provider]  calcitonin, salmon, (MIACALCIN/FORTICAL) 200 UNIT/ACT nasal spray Place 1 spray into alternate nostrils daily.  04/27/20   Johnson, Clanford L, MD  Calcium Citrate-Vitamin D (CITRACAL MAXIMUM) 315-250 MG-UNIT TABS Take 2 tablets by mouth daily.     [provider]  clopidogrel (PLAVIX) 75 MG tablet Take 1 tablet (75 mg total) by mouth daily. 03/28/20   Arnoldo Lenis, MD  DULoxetine (CYMBALTA) 30 MG capsule Take 30 mg by mouth daily.    [provider]  DULoxetine (CYMBALTA) 60 MG capsule Take 1 capsule by mouth daily. 08/01/19   [provider]  letrozole (FEMARA) 2.5 MG tablet Take 2.5 mg by mouth at bedtime.     [provider]  lidocaine-prilocaine (EMLA) cream Apply 1 application topically daily as needed (knee pain).  06/28/19   [provider]  losartan (COZAAR) 100 MG tablet Take 1 tablet (100 mg total) by mouth daily. 03/25/19   Arnoldo Lenis, MD  methadone (DOLOPHINE) 5 MG tablet Take 5 mg by mouth 3 (three) times daily.    [provider]  metoprolol succinate (TOPROL-XL) 100 MG 24 hr tablet TAKE 1 AND 1/2 TABLETS DAILY 03/28/20   Arnoldo Lenis, MD  Multiple Vitamins-Minerals (EQ VISION FORMULA 50+ PO) Take 1 tablet by mouth every morning.    [provider]  Multiple Vitamins-Minerals (HAIR/SKIN/NAILS) CAPS Take 1 capsule by mouth every morning.    [provider]  nitroGLYCERIN (NITROSTAT) 0.4 MG SL tablet Place 1 tablet (0.4 mg total) under the tongue every 5 (five) minutes x 3 doses as needed for chest pain (if no relief after 3rd dose, proceed to the ED or call 911). 03/22/20   Arnoldo Lenis, MD  oxycodone (OXY-IR) 5 MG capsule Take 10 mg by mouth every 3 (three) hours as needed.  [provider]  oxyCODONE-acetaminophen (PERCOCET/ROXICET) 5-325 MG tablet Take 1 tablet by mouth every 3 (three) hours. 06/24/20   Evalee Jefferson, PA-C  Potassium 99 MG TABS Take 2 tablets by mouth every morning.     [provider]  pregabalin (LYRICA) 100 MG capsule Take 1 capsule by mouth at bedtime.  08/01/19    [provider]  spironolactone (ALDACTONE) 25 MG tablet TAKE 1/2 TABLET(12.5 MG) BY MOUTH DAILY 03/28/20   Arnoldo Lenis, MD  torsemide (DEMADEX) 20 MG tablet Take 10 mg by mouth daily.    [provider]  Ubiquinol (QUNOL COQ10/UBIQUINOL/MEGA) 100 MG CAPS Take 1 capsule by mouth every morning. [place]    [provider]  warfarin (COUMADIN) 1 MG tablet Take 2 tablets daily or as directed by [facility]mad[facility] 04/29/20   Murlean Iba, MD   Allergies  Allergen Reactions  . Brilinta [Ticagrelor] Other (See Comments)    Nausea and severe diarrhea- general weakness. Patient says does not want to take again  . Albuterol Other (See Comments)    Heart racing    Review of Systems  Constitutional: Positive for activity change.  Cardiovascular: Negative for chest pain.  Musculoskeletal: Positive for back pain.    Physical Exam Vitals and nursing note reviewed.  Cardiovascular:     Pulses: Normal pulses.  Pulmonary:     Effort: Pulmonary effort is normal.  Musculoskeletal:        General: Tenderness present.     Comments: L hip/groin tender to palpation  Skin:    General: Skin is warm and dry.  Neurological:     Mental Status: She is alert and oriented to person, place, and time.     Comments: Some short term memory deficit  Psychiatric:        Mood and Affect: Mood normal.        Thought Content: Thought content normal.        Judgment: Judgment normal.     Vital Signs: BP (!) 152/71 (BP Location: Left Wrist)   Pulse 74   Temp 98.2 F (36.8 C) (Oral)   Resp 18   SpO2 98%  Pain Scale: 0-10   Pain Score: 10-Worst pain ever   SpO2: SpO2: 98 % O2 Device:SpO2: 98 % O2 Flow Rate: .   IO: Intake/output summary: No intake or output data in the 24 hours ending 06/27/20 1438  LBM:   Baseline Weight:   Most recent weight:       Palliative Assessment/Data: PPS: 30%     Thank you for this consult. Palliative medicine will  continue to follow and assist as needed.   Time In: 1000, 1400 Time Out: 1100, 1600 Time Total: 180 minutes Greater than 50%  of this time was spent counseling and coordinating care related to the above assessment and plan.  Signed by: Mariana Kaufman, AGNP-C Palliative Medicine    Please contact Palliative Medicine Team phone at [phone] for questions and concerns.  For individual provider: See Shea Evans

## 2020-06-27 NOTE — Plan of Care (Addendum)
  Problem: Acute Rehab PT Goals(only PT should resolve) Goal: Pt will Roll Supine to Side Outcome: Progressing Flowsheets (Taken 06/27/2020 1445) Pt will Roll Supine to Side: with supervision Goal: Pt Will Go Supine/Side To Sit Outcome: Progressing Flowsheets (Taken 06/27/2020 1445) Pt will go Supine/Side to Sit: with min guard assist Goal: Pt Will Go Sit To Supine/Side Outcome: Progressing Flowsheets (Taken 06/27/2020 1445) Pt will go Sit to Supine/Side: Independently   Problem: Acute Rehab PT Goals(only PT should resolve) Goal: Patient Will Transfer Sit To/From Stand Outcome: Progressing Flowsheets (Taken 06/27/2020 1445) Patient will transfer sit to/from stand: with moderate assist Goal: Pt Will Transfer Bed To Chair/Chair To Bed Outcome: Progressing Flowsheets (Taken 06/27/2020 1445) Pt will Transfer Bed to Chair/Chair to Bed: with mod assist Goal: Pt Will Ambulate Outcome: Progressing Flowsheets (Taken 06/27/2020 1445) Pt will Ambulate: . 10 feet . with moderate assist   2:47 PM , 06/27/20 Karlyn Agee, SPT Physical Therapy with [facility] [phone] office   During this treatment session, the therapist was present, participating in and directing the treatment.  3:18 PM, 06/27/20 Lonell Grandchild, MPT Physical Therapist with [facility] [phone] office [phone] mobile phone

## 2020-06-27 NOTE — ED Notes (Signed)
Pt c/o constipation. EDP made aware.

## 2020-06-27 NOTE — ED Notes (Signed)
Pt in bed, family at bedside, pt states that her pain is better, reports that her pain is a 10/10, states that she doesn't want anything for pain at this time

## 2020-06-28 DIAGNOSIS — I2510 Atherosclerotic heart disease of native coronary artery without angina pectoris: Secondary | ICD-10-CM | POA: Diagnosis present

## 2020-06-28 DIAGNOSIS — N179 Acute kidney failure, unspecified: Secondary | ICD-10-CM | POA: Diagnosis present

## 2020-06-28 DIAGNOSIS — M25552 Pain in left hip: Secondary | ICD-10-CM | POA: Diagnosis present

## 2020-06-28 DIAGNOSIS — Z7901 Long term (current) use of anticoagulants: Secondary | ICD-10-CM | POA: Diagnosis not present

## 2020-06-28 DIAGNOSIS — N182 Chronic kidney disease, stage 2 (mild): Secondary | ICD-10-CM | POA: Diagnosis present

## 2020-06-28 DIAGNOSIS — Z66 Do not resuscitate: Secondary | ICD-10-CM | POA: Diagnosis present

## 2020-06-28 DIAGNOSIS — Z170 Estrogen receptor positive status [ER+]: Secondary | ICD-10-CM | POA: Diagnosis not present

## 2020-06-28 DIAGNOSIS — C50911 Malignant neoplasm of unspecified site of right female breast: Secondary | ICD-10-CM | POA: Diagnosis present

## 2020-06-28 DIAGNOSIS — I480 Paroxysmal atrial fibrillation: Secondary | ICD-10-CM | POA: Diagnosis present

## 2020-06-28 DIAGNOSIS — C7951 Secondary malignant neoplasm of bone: Secondary | ICD-10-CM | POA: Diagnosis present

## 2020-06-28 DIAGNOSIS — R627 Adult failure to thrive: Secondary | ICD-10-CM | POA: Diagnosis present

## 2020-06-28 DIAGNOSIS — I130 Hypertensive heart and chronic kidney disease with heart failure and stage 1 through stage 4 chronic kidney disease, or unspecified chronic kidney disease: Secondary | ICD-10-CM | POA: Diagnosis present

## 2020-06-28 DIAGNOSIS — Z20822 Contact with and (suspected) exposure to covid-19: Secondary | ICD-10-CM | POA: Diagnosis present

## 2020-06-28 DIAGNOSIS — M545 Low back pain: Secondary | ICD-10-CM | POA: Diagnosis present

## 2020-06-28 DIAGNOSIS — Z515 Encounter for palliative care: Secondary | ICD-10-CM | POA: Diagnosis present

## 2020-06-28 DIAGNOSIS — Z7189 Other specified counseling: Secondary | ICD-10-CM | POA: Diagnosis not present

## 2020-06-28 DIAGNOSIS — I447 Left bundle-branch block, unspecified: Secondary | ICD-10-CM | POA: Diagnosis present

## 2020-06-28 DIAGNOSIS — E785 Hyperlipidemia, unspecified: Secondary | ICD-10-CM | POA: Diagnosis present

## 2020-06-28 DIAGNOSIS — Z6839 Body mass index (BMI) 39.0-39.9, adult: Secondary | ICD-10-CM | POA: Diagnosis not present

## 2020-06-28 DIAGNOSIS — D631 Anemia in chronic kidney disease: Secondary | ICD-10-CM | POA: Diagnosis present

## 2020-06-28 DIAGNOSIS — G893 Neoplasm related pain (acute) (chronic): Secondary | ICD-10-CM | POA: Diagnosis present

## 2020-06-28 DIAGNOSIS — J449 Chronic obstructive pulmonary disease, unspecified: Secondary | ICD-10-CM | POA: Diagnosis present

## 2020-06-28 DIAGNOSIS — C801 Malignant (primary) neoplasm, unspecified: Secondary | ICD-10-CM | POA: Diagnosis present

## 2020-06-28 DIAGNOSIS — I5042 Chronic combined systolic (congestive) and diastolic (congestive) heart failure: Secondary | ICD-10-CM | POA: Diagnosis present

## 2020-06-28 DIAGNOSIS — I252 Old myocardial infarction: Secondary | ICD-10-CM | POA: Diagnosis not present

## 2020-06-28 LAB — PROTIME-INR
INR: 4.1 (ref 0.8–1.2)
Prothrombin Time: 38.7 seconds — ABNORMAL HIGH (ref 11.4–15.2)

## 2020-06-28 MED ORDER — WARFARIN - PHARMACIST DOSING INPATIENT: Freq: Every day | Status: DC

## 2020-06-28 MED ORDER — DULOXETINE HCL 60 MG PO CPEP
90.0000 mg | ORAL_CAPSULE | Freq: Every day | ORAL | Status: DC
  Administered 2020-06-29 – 2020-07-05 (×7): 90 mg via ORAL
  Filled 2020-06-28 (×7): qty 1

## 2020-06-28 MED ORDER — DULOXETINE HCL 60 MG PO CPEP: 60.0000 mg | ORAL_CAPSULE | Freq: Every day | ORAL | Status: DC

## 2020-06-28 MED ORDER — DULOXETINE HCL 60 MG PO CPEP
60.0000 mg | ORAL_CAPSULE | Freq: Once | ORAL | Status: AC
  Administered 2020-06-28: 60 mg via ORAL
  Filled 2020-06-28: qty 1

## 2020-06-28 MED ORDER — CLONAZEPAM 0.125 MG PO TBDP: 1.0000 mg | ORAL_TABLET | Freq: Two times a day (BID) | ORAL | Status: DC | PRN

## 2020-06-28 MED ORDER — DEXAMETHASONE 4 MG PO TABS
8.0000 mg | ORAL_TABLET | Freq: Every day | ORAL | Status: DC
  Administered 2020-06-29 – 2020-07-05 (×7): 8 mg via ORAL
  Filled 2020-06-28 (×7): qty 2

## 2020-06-28 MED ORDER — OXYCODONE HCL 5 MG PO TABS
10.0000 mg | ORAL_TABLET | ORAL | Status: AC
  Administered 2020-06-28: 10 mg via ORAL
  Filled 2020-06-28: qty 2

## 2020-06-28 MED ORDER — DEXAMETHASONE 4 MG PO TABS
4.0000 mg | ORAL_TABLET | Freq: Once | ORAL | Status: AC
  Administered 2020-06-28: 4 mg via ORAL
  Filled 2020-06-28: qty 1

## 2020-06-28 MED ORDER — CLONAZEPAM 0.25 MG PO TBDP
1.0000 mg | ORAL_TABLET | Freq: Three times a day (TID) | ORAL | Status: DC
  Administered 2020-06-28 (×2): 1 mg via ORAL
  Filled 2020-06-28 (×2): qty 4

## 2020-06-28 MED ORDER — OXYCODONE HCL 5 MG PO TABS: 15.0000 mg | ORAL_TABLET | ORAL | Status: DC | PRN

## 2020-06-28 MED ORDER — OXYCODONE HCL 5 MG PO TABS
10.0000 mg | ORAL_TABLET | ORAL | Status: DC | PRN
  Administered 2020-06-29 – 2020-07-05 (×23): 10 mg via ORAL
  Filled 2020-06-28 (×23): qty 2

## 2020-06-28 MED ORDER — PREGABALIN 75 MG PO CAPS
150.0000 mg | ORAL_CAPSULE | Freq: Every day | ORAL | Status: DC
  Administered 2020-06-29 – 2020-07-05 (×7): 150 mg via ORAL
  Filled 2020-06-28 (×7): qty 2

## 2020-06-28 NOTE — Progress Notes (Addendum)
PROGRESS NOTE    Stacey [name]  DEY:[id number] DOB: 17-Dec-1945 DOA: 06/26/2020 PCP: Patient, No Pcp Per   Brief Narrative:  Per HPI: Stacey [name] is a 74 y.o. female with medical history significant for metastatic breast cancer, CAD, paroxysmal atrial fibrillation, COPD, and morbid obesity who presented to the ED with worsening left hip pain, despite taking her methadone 3 times daily and oxycodone 10 mg every 3 hours as needed for breakthrough pain.  This has been ongoing for the last several days and she finds it very difficult to ambulate or perform any activities of daily living on account of her severe pain.  She denies any new trauma, numbness/weakness, fevers or chills, chest pain, shortness of breath, nausea, vomiting, or diarrhea.  She continues to follow-up at [facility] with her oncologist for ongoing cancer treatments and would like to be able to make it there for further discussion of ongoing treatment and also discussed with the palliative care team at that facility.  Patient currently does not have a safe discharge plan as family cannot care for her at home and she appears to have lost her bed at [facility].  3/14: Patient continues to have significant pain that is exacerbated with any movement and is requiring high doses of oral pain medications as prescribed by palliative care.  Discussed with patient and family regarding the need for radiation treatment and they are now agreeable to going to [facility] for further evaluation and management by radiation oncology.  Discussed case with Dr. Tammi Klippel who is agreeable to seeing her and will evaluate further at [facility] in a.m.  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 ([place])   Left hip pain secondary to metastatic breast cancer -Previous metastatic bone lesion to L1 with prior radiation history -Noted new sclerotic lesion to left pubic ramus that is the likely culprit -Appears to be quite debilitating and patient cannot ambulate, nor perform ADLs -Safe discharge plan not present as patient has lost her SNF bed and family cannot care for her at home -No other acute findings on imaging studies -Appreciate palliative consultation with pain management recommendations and medication adjustments -Dexamethasone dose increased to 8 mg daily today -Discussed case with Dr. Tammi Klippel who will see patient in a.m. for radiation treatments.  Needs to transfer to [facility].  History of paroxysmal atrial fibrillation with supratherapeutic INR -Currently rate controlled -Continue metoprolol -Coumadin per pharmacy which will need to be held on account of INR elevation -No signs of bleeding noted  History of systolic and diastolic CHF -Appears euvolemic -Last 2D echocardiogram on 03/2020 with LVEF 35-40% with grade 1 diastolic dysfunction -Continue torsemide and spironolactone -Monitor daily weights  History of hypertension-controlled -Continue current doses of losartan and metoprolol -Continue torsemide and spironolactone as noted above  History of CAD -Continue atorvastatin and Plavix as well as metoprolol  Obesity -Lifestyle changes   DVT prophylaxis:Coumadin with supratherapeutic INR-pharmacy following Code Status: DNR Family Communication: Spoke with sisters at bedside Disposition Plan:  Status is: Inpatient  Remains inpatient appropriate because:Ongoing active pain requiring inpatient pain management and Unsafe d/c plan   Dispo: The patient is from: Home  Anticipated d/c is to: Home              Anticipated d/c date is: 2 days              Patient currently is not medically stable to d/c. She  continues to remain quite debilitated requiring excessive pain medications for comfort.  She cannot ambulate or perform any activities of daily living and is a high fall risk at home.  Family cannot care for her appropriately.  Consultants:   Palliative care  Procedures:   See below  Antimicrobials:   None   Subjective: Patient seen and evaluated today with ongoing severe pain that is exacerbated with any movement.  She is requiring high amounts of pain medications as prescribed by palliative care and is becoming quite somnolent.  After a long discussion, she appears agreeable to go to [facility] for consideration of radiation treatment for pain control.  Objective: Vitals:   06/27/20 2120 06/28/20 0048 06/28/20 0600 06/28/20 1426  BP: (!) 176/67 (!) 159/71 (!) 157/66 (!) 154/64  Pulse: 69 66 62 71  Resp: 18   20  Temp: 98.3 F (36.8 C) 98 F (36.7 C) 97.8 F (36.6 C) 98.3 F (36.8 C)  TempSrc:    Oral  SpO2: 94% 94% 97% 100%  Weight:      Height:        Intake/Output Summary (Last 24 hours) at 06/28/2020 1607 Last data filed at 06/28/2020 1300 Gross per 24 hour  Intake 720 ml  Output 2950 ml  Net -2230 ml   Filed Weights   06/27/20 1552  Weight: (!) 92.5 kg    Examination:  General exam: Appears calm and comfortable, obese  Respiratory system: Clear to auscultation. Respiratory effort normal. Cardiovascular system: S1 & S2 heard, RRR. No JVD, murmurs, rubs, gallops or clicks. No pedal edema. Gastrointestinal system: Abdomen is obese and soft Central nervous system: Alert and awake Extremities: No edema Skin: No rashes, lesions or ulcers Psychiatry: Pleasant affect    Data Reviewed: I have personally reviewed following labs and imaging studies  CBC: Recent Labs  Lab 06/27/20 1422  WBC 6.0  HGB 11.0*  HCT 35.1*  MCV 101.2*  PLT 128   Basic Metabolic Panel: Recent Labs  Lab 06/27/20 1422  NA 138  K 3.7  CL 102  CO2 27  GLUCOSE 122*  BUN 14   CREATININE 0.62  CALCIUM 8.6*   GFR: Estimated Creatinine Clearance: 63.6 mL/min (by C-G formula based on SCr of 0.62 mg/dL). Liver Function Tests: No results for input(s): AST, ALT, ALKPHOS, BILITOT, PROT, ALBUMIN in the last 168 hours. No results for input(s): LIPASE, AMYLASE in the last 168 hours. No results for input(s): AMMONIA in the last 168 hours. Coagulation Profile: Recent Labs  Lab 06/27/20 1422 06/28/20 0430  INR 5.1* 4.1*   Cardiac Enzymes: No results for input(s): CKTOTAL, CKMB, CKMBINDEX, TROPONINI in the last 168 hours. BNP (last 3 results) No results for input(s): PROBNP in the last 8760 hours. HbA1C: No results for input(s): HGBA1C in the last 72 hours. CBG: No results for input(s): GLUCAP in the last 168 hours. Lipid Profile: No results for input(s): CHOL, HDL, LDLCALC, TRIG, CHOLHDL, LDLDIRECT in the last 72 hours. Thyroid Function Tests: No results for input(s): TSH, T4TOTAL, FREET4, T3FREE, THYROIDAB in the last 72 hours. Anemia Panel: No results for input(s): VITAMINB12, FOLATE, FERRITIN, TIBC, IRON, RETICCTPCT in the last 72 hours. Sepsis Labs: No results for input(s): PROCALCITON, LATICACIDVEN  in the last 168 hours.  No results found for this or any previous visit (from the past 240 hour(s)).       Radiology Studies: DG Hip Unilat With Pelvis 2-3 Views Left  Result Date: 06/26/2020 CLINICAL DATA:  Increasing pelvic pain common no known injury, initial encounter EXAM: DG HIP (WITH OR WITHOUT PELVIS) 3V LEFT COMPARISON:  None. FINDINGS: Pelvic ring is intact. Mild degenerative changes of the hip joints are noted bilaterally. No acute fracture or dislocation is seen. No soft tissue abnormality is noted. IMPRESSION: Degenerative change without acute abnormality. Electronically Signed   By: Inez Catalina M.D.   On: 06/26/2020 22:21        Scheduled Meds: . acetaminophen  650 mg Oral Q8H  . atorvastatin  80 mg Oral Daily  . B-complex with  vitamin C  1 tablet Oral q morning - 10a  . calcitonin (salmon)  1 spray Alternating Nares Daily  . calcium-vitamin D  1 tablet Oral Daily  . clonazepam  1 mg Oral TID  . clopidogrel  75 mg Oral Daily  . [START ON 06/29/2020] dexamethasone  8 mg Oral Daily  . [START ON 06/29/2020] DULoxetine  90 mg Oral Daily  . letrozole  2.5 mg Oral QHS  . losartan  100 mg Oral Daily  . methadone  5 mg Oral QID  . metoprolol succinate  100 mg Oral Daily  . oxyCODONE-acetaminophen  2 tablet Oral QHS  . pregabalin  150 mg Oral BID  . senna  2 tablet Oral BID  . spironolactone  12.5 mg Oral Daily  . torsemide  10 mg Oral Daily  . Warfarin - Pharmacist Dosing Inpatient   Does not apply q1600    LOS: 0 days    Time spent: 4 minutes    Stacey Farabee Darleen Crocker, DO [facility]  If 7PM-7AM, please contact night-coverage [email or web address] 06/28/2020, 4:07 PM

## 2020-06-28 NOTE — Progress Notes (Addendum)
Daily Progress Note   Patient Name: Stacey [name]       Date: 06/28/2020 DOB: Sep 15, 1946  Age: 74 y.o. MRN#: [id number] Attending Physician: Rodena Goldmann, DO Primary Care Physician: Patient, No Pcp Per Admit Date: 06/26/2020  Reason for Consultation/Follow-up: Establishing goals of care and Pain control  Subjective: I spent time this morning at bedside with Stacey [name], her son Stacey [name], her sister Stacey [name], another sister, and her daughter Stacey [name] on the phone. Everyone is supportive in working towards a care plan that includes Stacey [name] being able to  Return home. Right now the main concern is that she is in too much pain to get out of bed to get to a bedside toilet.  I reviewed her medication list with her and we discussed the appropriate way to use the prn medications. I remained at bedside as she took the prn 10mg  oxycodone. I then returned 30 mins later to eval if she had received any relief. At that time we attempted to get her to sit on the edge of the bed. She had much anticipation of the pain and her entire body was tense throughout the effort. She was unable to sit on the edge of the bed.  We discussed the interaction of anxiety and pain. Previously she was on klonopin TID.  We had deeper discussion re: what are her overall goals? She doe have motivation to see radiation oncology at [facility]. They are able to see her on Monday.  We discussed that she may have to utilize prns and push through some pain to make it to her appointment. I encouraged her to use deep breathing techniques, good utilization of her prn medication PRIOR TO any activity, and to take all her medications as scheduled.  Advanced care planning was discussed. She has a DNR form at home- I will complete a new for her here due to her admission.  Stacey [name] has HCPOA and will bring a copy. We discussed the difficult balance of managing pain and keeping someone functional. I encouraged her to consult with her radiation oncologist and pain management providers and to continue to consider her overall goals of care.  Discussed with patient that per attending she may not qualify for another overnight stay in the hospital. She and family have concerns about care at home- we discussed utilizing home  equipment- hospital bed, bedside toilet. We discussed PT at home, and making small movements at a time while working through pain. Also encouraged patient to continue communicating with her outpatient Palliative providers regarding pain medication adjustments. She is going to start a disability application which may also open her up for more resources for care at home.   ROS  Length of Stay: 0  Current Medications: Scheduled Meds:  . acetaminophen  650 mg Oral Q8H  . atorvastatin  80 mg Oral Daily  . B-complex with vitamin C  1 tablet Oral q morning - 10a  . calcitonin (salmon)  1 spray Alternating Nares Daily  . calcium-vitamin D  1 tablet Oral Daily  . clonazepam  1 mg Oral TID  . clopidogrel  75 mg Oral Daily  . [START ON 06/29/2020] dexamethasone  8 mg Oral Daily  . [START ON 06/29/2020] DULoxetine  90 mg Oral Daily  . letrozole  2.5 mg Oral QHS  . losartan  100 mg Oral Daily  . methadone  5 mg Oral QID  . metoprolol succinate  100 mg Oral Daily  . oxyCODONE-acetaminophen  2 tablet Oral QHS  . pregabalin  150 mg Oral BID  . senna  2 tablet Oral BID  . spironolactone  12.5 mg Oral Daily  . torsemide  10 mg Oral Daily  . Warfarin - Pharmacist Dosing Inpatient   Does not apply q1600    Continuous Infusions:   PRN Meds: albuterol, ondansetron **OR** ondansetron (ZOFRAN) IV, oxyCODONE  Physical Exam Vitals and nursing note reviewed.  Cardiovascular:     Rate and Rhythm: Normal rate.  Pulmonary:     Effort: Pulmonary effort is normal.    Musculoskeletal:        General: Tenderness present.     Comments: Left lower back/buttocks tenderness  Skin:    General: Skin is warm and dry.  Neurological:     Mental Status: She is alert.     Motor: Weakness present.             Vital Signs: BP (!) 154/64 (BP Location: Right Wrist)   Pulse 71   Temp 98.3 F (36.8 C) (Oral)   Resp 20   Ht 5' (1.524 m)   Wt (!) 92.5 kg   SpO2 100%   BMI 39.84 kg/m  SpO2: SpO2: 100 % O2 Device: O2 Device: Room Air O2 Flow Rate:    Intake/output summary:   Intake/Output Summary (Last 24 hours) at 06/28/2020 1515 Last data filed at 06/28/2020 1100 Gross per 24 hour  Intake 480 ml  Output 2950 ml  Net -2470 ml   LBM: Last BM Date: 06/27/20 Baseline Weight: Weight: (!) 92.5 kg Most recent weight: Weight: (!) 92.5 kg       Palliative Assessment/Data: PPS: 40%     Patient Active Problem List   Diagnosis Date Noted  . Left hip pain   . Pain of metastatic malignancy 06/27/2020  . Malignant neoplasm metastatic to bone ([place])   . Malignant neoplasm of right breast in female, estrogen receptor positive ([place])   . Goals of care, counseling/discussion   . Advanced care planning/counseling discussion   . Spinal stenosis of lumbar region   . Bone metastases ([place])   . Palliative care by specialist   . Lumbar pain 04/24/2020  . Lumbar radiculopathy 02/16/2020  . Palpitations 11/16/2019  . Dysuria   . Acute bilateral low back pain 10/02/2019  . Bipolar 2 disorder, major depressive episode ([place]) 09/01/2019  .  Adjustment disorder with depressed mood 06/30/2019  . Encounter for therapeutic drug monitoring 12/28/2018  . Atrial fibrillation with rapid ventricular response ([place]) 11/14/2018  . Fall   . Obesity, Class III, BMI 40-49.9 (morbid obesity) ([place])   . Chronic respiratory failure with hypoxia ([place])   . Elevated troponin 10/07/2018  . PSVT (paroxysmal supraventricular tachycardia) ([place]) 10/07/2018  . Hyperlipidemia 10/07/2018  .  COPD (chronic obstructive pulmonary disease) ([place]) 10/07/2018  . CAD in native artery 10/07/2018  . Chronic pain 10/07/2018  . Acute back pain 10/07/2018  . Acute hypoxemic respiratory failure ([place]) 09/19/2018  . Acute on chronic combined systolic and diastolic CHF (congestive heart failure) ([place]) 09/19/2018  . COPD with acute exacerbation ([place]) 09/19/2018  . Dyspnea 11/30/2017  . Non-ST elevation (NSTEMI) myocardial infarction ([place])   . Hypertension 11/23/2017  . Left bundle branch block 11/23/2017  . Acute on chronic diastolic CHF (congestive heart failure) ([place]) 11/23/2017  . CAD (coronary artery disease) 11/23/2017  . Metastatic breast cancer ([place]) 11/23/2017  . Acute respiratory failure with hypoxia ([place]) 11/23/2017  . Atypical nevus 07/29/2016  . Age-related nuclear cataract, bilateral 02/12/2016  . Anxiety and depression 01/24/2016  . Arthropathy, lower leg 09/14/2013  . Tibialis tendinitis 02/22/2013  . 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 Recent admission and discharge to SNF now presenting to ED for disabling pain in the L hip/groin area. Noted xray on 7/25 showing sclerotic lesion on "L interior pubic ramus, corresponding to a sclerotic region on CT likely a sclerotic metastasis". Palliative medicine consulted for symptom management and goals of care.     Assessment/Recommendations/Plan   Increase prn oxycodone to 15mg  q3hr prn  Increase dexamethasone to 8mg  daily  Recommend home equipment- bed, bedside toilet, home PT/OT  Today is last day on service for PMT provider- provider will return on Monday- recommend continued close monitoring for side effects that I have  discussed with patient including somnolence, flushing, dizziness, heart palpitations, constipation, falls, high blood pressure and others  Addendum- re-evaluated patient at 1640- she was feeling much better, in much less pain, but was becoming more lethargic. She was eager to show me that she was able to sit on the edge of her bed without assistance. In efforts to decrease somnolence- will change klonopin to TID PRN, decrease prn oxycodone to 10mg  q2hr prn and decrease pregabalin to 150mg  daily    Goals of Care and Additional Recommendations:  Limitations on Scope of Treatment: Full Scope Treatment  Code Status:  DNR  Prognosis:   Unable to determine  Discharge Planning:  Home with Home Health  Thank you for allowing the Palliative Medicine Team to assist in the care of this patient.   Time In: 1000 Time Out: 1200 Total Time 120 mins Prolonged Time Billed yes      Greater than 50%  of this time was spent counseling and coordinating care related to the above assessment and plan.  Mariana Kaufman, AGNP-C Palliative Medicine   Please contact Palliative Medicine Team phone at [phone] for questions and concerns.

## 2020-06-28 NOTE — Progress Notes (Signed)
ANTICOAGULATION CONSULT NOTE -   Pharmacy Consult for warfarin dosing  Indication: atrial fibrillation   Allergies  Allergen Reactions  . Brilinta [Ticagrelor] Other (See Comments)    Nausea and severe diarrhea- general weakness. Patient says does not want to take again  . Albuterol Other (See Comments)    Heart racing       Patient Measurements: Last Weight  Most recent update: 06/27/2020  6:18 PM   Weight  92.5 kg (204 lb)             Body mass index is 39.84 kg/m. [place] B [place]               Temp: 97.8 F (36.6 C) (07/29 0600) BP: 157/66 (07/29 0600) Pulse Rate: 62 (07/29 0600)  Labs: Recent Labs    06/27/20 1422 06/28/20 0430  HGB 11.0*  --   HCT 35.1*  --   PLT 217  --   LABPROT 45.6* 38.7*  INR 5.1* 4.1*  CREATININE 0.62  --     Estimated Creatinine Clearance: 63.6 mL/min (by C-G formula based on SCr of 0.62 mg/dL).    Goal of Therapy:  INR 2-3 Monitor platelets by anticoagulation protocol: Yes   Concurrent anti-platelet meds: clopidogrel 75mg  daily    Prior to Admission Warfarin Dosing:  [place] B Previti takes 2mg  of warfarin daily     Admit INR was 5.1 Lab Results  Component Value Date   INR 4.1 (HH) 06/28/2020   INR 5.1 (HH) 06/27/2020   INR 4.2 ([place]) 04/26/2020    Assessment: Stacey [name] a 74 y.o. female requires anticoagulation with warfarin for the indication of  atrial fibrillation. Warfarin will be initiated inpatient following pharmacy protocol per pharmacy consult. Patient most recent blood work is as follows: CBC Latest Ref Rng & Units 06/27/2020 04/26/2020 04/24/2020  WBC 4.0 - 10.5 K/uL 6.0 4.6 5.0  Hemoglobin 12.0 - 15.0 g/dL 11.0(L) 11.6(L) 11.3(L)  Hematocrit 36 - 46 % 35.1(L) 36.9 34.8(L)  Platelets 150 - 400 K/uL 217 189 189    Plan: Hold warfarin again today for INR 4.1 Monitor CBC MWF with am labs   Monitor INR daily Monitor for signs and symptoms of bleeding   Despina Pole, Pharm. D. Clinical  Pharmacist 06/28/2020 1:35 PM

## 2020-06-28 NOTE — Plan of Care (Signed)

## 2020-06-29 ENCOUNTER — Ambulatory Visit
Admit: 2020-06-29 | Discharge: 2020-06-29 | Disposition: A | Discharge status: Home / Self Care | Payer: Medicare Other | Attending: Radiation Oncology | Admitting: Radiation Oncology

## 2020-06-29 ENCOUNTER — Encounter: Payer: Self-pay | Admitting: Radiation Oncology

## 2020-06-29 DIAGNOSIS — C7951 Secondary malignant neoplasm of bone: Secondary | ICD-10-CM

## 2020-06-29 LAB — CREATININE, URINE, RANDOM: Creatinine, Urine: 119.34 mg/dL

## 2020-06-29 LAB — URINALYSIS, ROUTINE W REFLEX MICROSCOPIC
Bilirubin Urine: NEGATIVE
Glucose, UA: NEGATIVE mg/dL
Hgb urine dipstick: NEGATIVE
Ketones, ur: NEGATIVE mg/dL
Nitrite: NEGATIVE
Protein, ur: NEGATIVE mg/dL
Specific Gravity, Urine: 1.014 (ref 1.005–1.030)
pH: 5 (ref 5.0–8.0)

## 2020-06-29 LAB — BASIC METABOLIC PANEL
Anion gap: 11 (ref 5–15)
BUN: 29 mg/dL — ABNORMAL HIGH (ref 8–23)
CO2: 29 mmol/L (ref 22–32)
Calcium: 8.8 mg/dL — ABNORMAL LOW (ref 8.9–10.3)
Chloride: 96 mmol/L — ABNORMAL LOW (ref 98–111)
Creatinine, Ser: 1.11 mg/dL — ABNORMAL HIGH (ref 0.44–1.00)
GFR calc Af Amer: 57 mL/min — ABNORMAL LOW (ref >60)
GFR calc non Af Amer: 49 mL/min — ABNORMAL LOW (ref >60)
Glucose, Bld: 114 mg/dL — ABNORMAL HIGH (ref 70–99)
Potassium: 4.4 mmol/L (ref 3.5–5.1)
Sodium: 136 mmol/L (ref 135–145)

## 2020-06-29 LAB — CBC
HCT: 36.4 % (ref 36.0–46.0)
Hemoglobin: 11.4 g/dL — ABNORMAL LOW (ref 12.0–15.0)
MCH: 31.8 pg (ref 26.0–34.0)
MCHC: 31.3 g/dL (ref 30.0–36.0)
MCV: 101.4 fL — ABNORMAL HIGH (ref 80.0–100.0)
Platelets: 271 10*3/uL (ref 150–400)
RBC: 3.59 MIL/uL — ABNORMAL LOW (ref 3.87–5.11)
RDW: 14.2 % (ref 11.5–15.5)
WBC: 5.2 10*3/uL (ref 4.0–10.5)
nRBC: 0 % (ref 0.0–0.2)

## 2020-06-29 LAB — PROTIME-INR
INR: 4.6 (ref 0.8–1.2)
Prothrombin Time: 42.5 seconds — ABNORMAL HIGH (ref 11.4–15.2)

## 2020-06-29 LAB — SODIUM, URINE, RANDOM: Sodium, Ur: 20 mmol/L

## 2020-06-29 MED ORDER — LACTATED RINGERS IV SOLN: INTRAVENOUS | Status: AC

## 2020-06-29 MED ORDER — PHYTONADIONE 1 MG/0.5 ML ORAL SOLUTION: 1.0000 mg | Freq: Once | ORAL | Status: DC

## 2020-06-29 MED ORDER — PHYTONADIONE 5 MG PO TABS
2.5000 mg | ORAL_TABLET | Freq: Once | ORAL | Status: AC
  Administered 2020-06-29: 2.5 mg via ORAL
  Filled 2020-06-29 (×2): qty 1

## 2020-06-29 NOTE — Progress Notes (Signed)
In radiation when I went to bedside, seen by Dr. Manuella Ghazi today, see his note for details of plan.

## 2020-06-29 NOTE — Progress Notes (Signed)
  Radiation Oncology         [phone] ________________________________  Name: Stacey [name] MRN: [id number]  Date: 06/29/2020  DOB: 1946-01-22  Inpatient  SIMULATION AND TREATMENT PLANNING NOTE    ICD-10-CM   1. Metastasis to bone of unknown primary (HCC)  C79.51    C80.1   2. Bone metastases ([place])  C79.51     DIAGNOSIS:  73 yo woman with a pubic ramus bone metastasis.  NARRATIVE:  The patient was brought to the [facility].  Identity was confirmed.  All relevant records and images related to the planned course of therapy were reviewed.  The patient freely provided informed written consent to proceed with treatment after reviewing the details related to the planned course of therapy. The consent form was witnessed and verified by the simulation staff.  Then, the patient was set-up in a stable reproducible  supine position for radiation therapy.  CT images were obtained.  Surface markings were placed.  The CT images were loaded into the planning software.  Then the target and avoidance structures were contoured.  Treatment planning then occurred.  The radiation prescription was entered and confirmed.  Then, I designed and supervised the construction of a total of 3 medically necessary complex treatment devices consisting of leg positioner and MLC apertures to cover the treated hip area.  I have requested : 3D Simulation  I have requested a DVH of the following structures: Rectum, Bladder, femoral heads and target.  PLAN:  The patient will receive 8 Gy in 1 fraction for palliation of pain .  ________________________________  Sheral Apley. Tammi Klippel, M.D.

## 2020-06-29 NOTE — Progress Notes (Signed)
Patient has been transferred to [facility] after her scan. Family has been informed of possible bed placement with [facility] after completion of scan. Report has been called and given to [place] Staff. All questions were answered and no further questions at this time.

## 2020-06-29 NOTE — Consult Note (Addendum)
Radiation Oncology         [phone] ________________________________  Initial inpatient Consultation  Name: Stacey [name] MRN: [id number]  Date of Service: 06/26/2020 DOB: [birth year]  CC:  Dr.  Heath Lark  REFERRING PHYSICIAN:  Dr. Heath Lark  DIAGNOSIS: 74 year old female with a new, painful bony metastasis in the left pubic ramus secondary to known metastatic breast cancer.    ICD-10-CM   1. Left hip pain  M25.552   2. Malignant neoplasm metastatic to bone [facility])  C79.51     HISTORY OF PRESENT ILLNESS: Stacey [name] is a 74 y.o. female seen at the request of Dr. Manuella Ghazi.  She was initially diagnosed with locally advanced breast cancer in 01/2016 and was found to have osseous metastasis at L2 in 05/2016. She has been treated with a course of palliative radiation to L1-L3 in February 2018 as well subsequent salvage SRS/SRT to L1-L2 in December 2019. She was on first line letrozole from 01/2016 through 01/2019 at [facility], but this was stopped due to disease progression. She was switched to fulvestrant at that time, which she continues on as well as Xgeva (recent dose on 06/18/20).    She had recently been discharged to a skilled nursing facility from a hospital admission on 06/02/2020 but was recently readmitted at [facility] with poorly controlled acute right-sided low back pain and right-sided sciatica on 06/04/2020.  She was found to have slightly increased height loss in a known pathologic closed compression fracture of L1, now with 40% height loss.  Additionally, there was a stable sclerotic lesion within the L2 vertebral body/inferior endplate of L1, consistent with osseous metastatic disease and a stable calcified lesion within the ventral epidural space at L1-L2.  She has previously been evaluated for interventions (kyphoplasty, radiation, etc.) but is not felt to be a candidate.  She has continued on a pain regimen with Cymbalta 60 mg daily, methadone 5 mg 3 times daily, Lyrica 150 p.o. twice  daily and oxycodone 10 mg every 3 hours as needed.  She has also had several epidural injections with Dr. Wynonia Musty. Sheria Lang with great improvement in her radicular symptoms.  Her most recent restaging CT C/A/P performed at [facility] on 06/18/2020 showed no significant change in the right breast mass, right apical pulmonary nodule, or osseous metastasis but there was an increase in size of the left upper pole renal lesion, concerning for renal cell carcinoma. This was previously discussed with Urology at [facility] in 2019 and they recommended monitoring but she has recently been referred back to Urology for further evaluation.  She recently presented to the [facility] ED on 06/24/2020 with new, worsening left hip pain. Bilateral hip x-ray performed at that time showed a probable sclerotic metastasis at the left inferior pubic ramus. She was discharged with additional pain medication. However, she returned on 06/26/2020 with increasing pain that limits her ambulation and ability to perform her daily activities, poorly controlled with oral pain medications.  She was initially hoping to get back in with her radiation oncologist over at [facility] but they were unable to see her until sometime next week.  Because her pain has been poorly controlled with oral pain medications, she requested to be transferred to [facility] for consideration of palliative radiotherapy sooner.  Therefore, we have been asked to consult for consideration of palliative radiotherapy to help control her left hip pain.  PREVIOUS RADIATION THERAPY: Yes  1) L1-L2, 30 Gy/10 fractions, completed 01/28/17 (Dr. Tyrone Nine at [facility]) 2) SBRT  to L1-L2, 16 Gy x 1, completed 11/30/18 (Dr. Tyrone Nine at [facility])  PAST MEDICAL HISTORY:  Past Medical History:  Diagnosis Date  . CAD in native artery    a. DES to ramus 2005 with late stent thrombosis 2006 tx with PTCA. 12/18 PCI/DES x1 to mRCA, EF 50-55%  . CHF (congestive heart failure) ([place])   . Chronic pain   . COPD  (chronic obstructive pulmonary disease) ([place])   . Hyperlipidemia   . Hypertension   . Left bundle branch block   . Lymphedema   . Metastatic breast cancer ([place])    a. to bone.  . MI (myocardial infarction) ([place])   . Mild aortic stenosis 10/2017  . Morbid obesity ([place])   . PAF (paroxysmal atrial fibrillation) ([place])   . PSVT (paroxysmal supraventricular tachycardia) ([place])    a. per [facility] notes, seen on event monitor in 2014.  . Pulmonary nodules       PAST SURGICAL HISTORY: Past Surgical History:  Procedure Laterality Date  . CORONARY STENT INTERVENTION N/A 11/26/2017   Procedure: CORONARY STENT INTERVENTION;  Surgeon: Martinique, Peter M, MD;  Location: [place] [facility];  Service: Cardiovascular;  Laterality: N/A;  . CORONARY STENT PLACEMENT    . LEFT HEART CATH AND CORONARY ANGIOGRAPHY N/A 11/26/2017   Procedure: LEFT HEART CATH AND CORONARY ANGIOGRAPHY;  Surgeon: Martinique, Peter M, MD;  Location: [place] [facility];  Service: Cardiovascular;  Laterality: N/A;    FAMILY HISTORY:  Family History  Problem Relation Age of Onset  . CAD Father 53  . Heart attack Father   . COPD Sister   . CAD Paternal Grandmother   . Sudden Cardiac Death Neg Hx     SOCIAL HISTORY:  Social History   Socioeconomic History  . Marital status: Single    Spouse name: Not on file  . Number of children: Not on file  . Years of education: Not on file  . Highest education level: Not on file  Occupational History  . Occupation: Retired  Tobacco Use  . Smoking status: Former Smoker    Packs/day: 2.00    Years: 18.00    Pack years: 36.00    Types: Cigarettes    Quit date: 12/01/1980    Years since quitting: 39.6  . Smokeless tobacco: Never Used  Vaping Use  . Vaping Use: Never used  Substance and Sexual Activity  . Alcohol use: No  . Drug use: No  . Sexual activity: Not on file  Other Topics Concern  . Not on file  Social History Narrative  . Not on file   Social Determinants of Health    Financial Resource Strain:   . Difficulty of Paying Living Expenses:   Food Insecurity:   . Worried About Charity fundraiser in the Last Year:   . Arboriculturist in the Last Year:   Transportation Needs:   . Film/video editor (Medical):   Marland Kitchen Lack of Transportation (Non-Medical):   Physical Activity:   . Days of Exercise per Week:   . Minutes of Exercise per Session:   Stress:   . Feeling of Stress :   Social Connections:   . Frequency of Communication with Friends and Family:   . Frequency of Social Gatherings with Friends and Family:   . Attends Religious Services:   . Active Member of Clubs or Organizations:   . Attends Archivist Meetings:   Marland Kitchen Marital Status:   Intimate Partner Violence:   .  Fear of Current or Ex-Partner:   . Emotionally Abused:   Marland Kitchen Physically Abused:   . Sexually Abused:     ALLERGIES: Brilinta [ticagrelor] and Albuterol  MEDICATIONS:  Current Facility-Administered Medications  Medication Dose Route Frequency Provider Last Rate Last Admin  . acetaminophen (TYLENOL) tablet 650 mg  650 mg Oral Q8H Mahan, Kasie J, NP   650 mg at 06/29/20 1340  . albuterol (PROVENTIL) (2.5 MG/3ML) 0.083% nebulizer solution 3 mL  3 mL Nebulization Q6H PRN Manuella Ghazi, Pratik D, DO      . atorvastatin (LIPITOR) tablet 80 mg  80 mg Oral Daily Manuella Ghazi, Pratik D, DO   80 mg at 06/29/20 0943  . B-complex with vitamin C tablet 1 tablet  1 tablet Oral q morning - 10a Manuella Ghazi, Pratik D, DO   1 tablet at 06/29/20 0944  . calcitonin (salmon) (MIACALCIN/FORTICAL) nasal spray 1 spray  1 spray Alternating Nares Daily Shah, Pratik D, DO      . calcium-vitamin D (OSCAL WITH D) 500-200 MG-UNIT per tablet 1 tablet  1 tablet Oral Daily Manuella Ghazi, Pratik D, DO   1 tablet at 06/29/20 0944  . clonazePAM (KLONOPIN) disintegrating tablet 1 mg  1 mg Oral BID PRN Earlie Counts, NP      . clopidogrel (PLAVIX) tablet 75 mg  75 mg Oral Daily Manuella Ghazi, Pratik D, DO   75 mg at 06/29/20 0944  . dexamethasone  (DECADRON) tablet 8 mg  8 mg Oral Daily Earlie Counts, NP   8 mg at 06/29/20 0944  . DULoxetine (CYMBALTA) DR capsule 90 mg  90 mg Oral Daily Heath Lark D, DO   90 mg at 06/29/20 0944  . lactated ringers infusion   Intravenous Continuous Manuella Ghazi, Pratik D, DO      . letrozole [facility]) tablet 2.5 mg  2.5 mg Oral QHS Manuella Ghazi, Pratik D, DO   2.5 mg at 06/28/20 2124  . methadone (DOLOPHINE) tablet 5 mg  5 mg Oral QID Earlie Counts, NP   5 mg at 06/29/20 1340  . metoprolol succinate (TOPROL-XL) 24 hr tablet 100 mg  100 mg Oral Daily Manuella Ghazi, Pratik D, DO   100 mg at 06/29/20 0945  . ondansetron (ZOFRAN) tablet 4 mg  4 mg Oral Q6H PRN Manuella Ghazi, Pratik D, DO       Or  . ondansetron (ZOFRAN) injection 4 mg  4 mg Intravenous Q6H PRN Manuella Ghazi, Pratik D, DO      . oxyCODONE (Oxy IR/ROXICODONE) immediate release tablet 10 mg  10 mg Oral Q3H PRN Earlie Counts, NP      . oxyCODONE-acetaminophen (PERCOCET/ROXICET) 5-325 MG per tablet 2 tablet  2 tablet Oral QHS Earlie Counts, NP   2 tablet at 06/28/20 2120  . phytonadione (VITAMIN K) tablet 2.5 mg  2.5 mg Oral Once Manuella Ghazi, Pratik D, DO      . pregabalin (LYRICA) capsule 150 mg  150 mg Oral Daily Earlie Counts, NP   150 mg at 06/29/20 0944  . senna (SENOKOT) tablet 17.2 mg  2 tablet Oral BID Earlie Counts, NP   17.2 mg at 06/29/20 0945  . Warfarin - Pharmacist Dosing Inpatient   Does not apply q1600 Heath Lark D, DO        REVIEW OF SYSTEMS:  On review of systems, the patient reports that she is doing well overall. She denies any chest pain, shortness of breath, cough, fevers, chills, night sweats, unintended weight changes. She denies any  bowel or bladder disturbances, and denies abdominal pain, nausea or vomiting. She denies any new musculoskeletal or joint aches or pains aside from the pain in the left hip that has become debilitating and poorly controlled with oral pain medications. A complete review of systems is obtained and is otherwise negative.   PHYSICAL EXAM:    Wt Readings from Last 3 Encounters:  06/29/20 201 lb 3.2 oz (91.3 kg)  06/24/20 (!) 204 lb (92.5 kg)  04/24/20 214 lb 4.6 oz (97.2 kg)   Temp Readings from Last 3 Encounters:  06/29/20 98.2 F (36.8 C) (Oral)  06/24/20 98 F (36.7 C)  04/26/20 98.2 F (36.8 C) (Axillary)   BP Readings from Last 3 Encounters:  06/29/20 (!) 143/61  06/24/20 122/71  04/26/20 (!) 99/50   Pulse Readings from Last 3 Encounters:  06/29/20 58  06/24/20 71  04/26/20 64   Pain Assessment Pain Score: Asleep/10  In general this is a well appearing Caucasian female in no acute distress.  She is alert and oriented x4 and appropriate throughout the examination. HEENT reveals that the patient is normocephalic, atraumatic. EOMs are intact. PERRLA. Skin is intact without any evidence of gross lesions. Cardiopulmonary assessment is negative for acute distress and she exhibits normal effort. Lower extremities are negative for pretibial pitting edema, deep calf tenderness, cyanosis or clubbing.   KPS = 50  100 - Normal; no complaints; no evidence of disease. 90   - Able to carry on normal activity; minor signs or symptoms of disease. 80   - Normal activity with effort; some signs or symptoms of disease. 56   - Cares for self; unable to carry on normal activity or to do active work. 60   - Requires occasional assistance, but is able to care for most of his personal needs. 50   - Requires considerable assistance and frequent medical care. 36   - Disabled; requires special care and assistance. 75   - Severely disabled; hospital admission is indicated although death not imminent. 66   - Very sick; hospital admission necessary; active supportive treatment necessary. 10   - Moribund; fatal processes progressing rapidly. 0     - Dead  Karnofsky DA, Abelmann Linn, Craver LS and Burchenal [facility] [phone]) The use of the nitrogen mustards in the palliative treatment of carcinoma: with particular reference to bronchogenic  carcinoma Cancer 1 634-56  LABORATORY DATA:  Lab Results  Component Value Date   WBC 5.2 06/29/2020   HGB 11.4 (L) 06/29/2020   HCT 36.4 06/29/2020   MCV 101.4 (H) 06/29/2020   PLT 271 06/29/2020   Lab Results  Component Value Date   NA 136 06/29/2020   K 4.4 06/29/2020   CL 96 (L) 06/29/2020   CO2 29 06/29/2020   Lab Results  Component Value Date   ALT 17 04/24/2020   AST 23 04/24/2020   ALKPHOS 44 04/24/2020   BILITOT 1.3 (H) 04/24/2020     RADIOGRAPHY: DG Lumbar Spine Complete  Result Date: 06/24/2020 CLINICAL DATA:  Pain, known metastatic breast cancer EXAM: LUMBAR SPINE - COMPLETE 4+ VIEW COMPARISON:  CT abdomen and pelvis 04/24/2020 FINDINGS: Five non-rib-bearing lumbar vertebra with partial sacralization of LEFT transverse process of L5. Diffuse osseous demineralization. Levoconvex thoracolumbar scoliosis. Multilevel disc space narrowing and endplate spur formation. Multilevel facet degenerative changes. Sclerosis at L2 corresponding to known sclerotic metastasis. Chronic superior endplate concavity at L1 unchanged. No acute fracture or bone destruction. Minimal anterolisthesis L4-L5 likely degenerative. Atherosclerotic calcifications aorta.  IMPRESSION: Multilevel degenerative disc and facet disease changes of lumbar spine with levoconvex scoliosis. Unchanged mild superior endplate concavity at L1. Known sclerotic metastasis L2. No acute abnormalities. Electronically Signed   By: Lavonia Dana M.D.   On: 06/24/2020 16:40   DG HIPS BILAT WITH PELVIS 2V  Result Date: 06/24/2020 CLINICAL DATA:  BILATERAL hip pain, new and worse on LEFT EXAM: DG HIP (WITH OR WITHOUT PELVIS) 2V BILAT COMPARISON:  CT abdomen and pelvis 04/24/2020 FINDINGS: Osseous demineralization. Hip and SI joint spaces preserved. Facet degenerative changes at lower lumbar spine. No acute fracture, dislocation, or bone destruction. Sclerotic focus identified at the LEFT inferior pubic ramus, corresponding to a  sclerotic region on CT likely a sclerotic metastasis. IMPRESSION: Probable sclerotic metastasis at the LEFT inferior pubic ramus. No additional acute osseous abnormalities. Electronically Signed   By: Lavonia Dana M.D.   On: 06/24/2020 16:43   DG Hip Unilat With Pelvis 2-3 Views Left  Result Date: 06/26/2020 CLINICAL DATA:  Increasing pelvic pain common no known injury, initial encounter EXAM: DG HIP (WITH OR WITHOUT PELVIS) 3V LEFT COMPARISON:  None. FINDINGS: Pelvic ring is intact. Mild degenerative changes of the hip joints are noted bilaterally. No acute fracture or dislocation is seen. No soft tissue abnormality is noted. IMPRESSION: Degenerative change without acute abnormality. Electronically Signed   By: Inez Catalina M.D.   On: 06/26/2020 22:21      IMPRESSION/PLAN: 1. 74 y.o. female with a new, painful bony metastasis in the left pubic ramus secondary to known metastatic breast cancer. Today, we talked to the patient about the findings and workup thus far. We discussed the natural history of metastatic breast carcinoma and general treatment, highlighting the role of radiotherapy in the management of painful bony metastasis. We discussed the available radiation techniques, and focused on the details and logistics of delivery.  The recommendation is to proceed with a single fraction of palliative radiotherapy targeting the painful metastatic lesion in the left pubic ramus.  We reviewed the anticipated acute and late sequelae associated with radiation in this setting. The patient was encouraged to ask questions that were answered to her stated satisfaction.  At the conclusion of our conversation, the patient elects to proceed with a single fraction of palliative radiotherapy targeting the painful metastatic lesion in the left pubic ramus.  She appears to have a good understanding of her disease and our treatment recommendations which are of palliative intent.  She has freely signed written consent to  proceed and a copy of this document will be placed in her medical record.  She will proceed with CT simulation/treatment planning followed by the single fraction of palliative radiotherapy later today.  She now has a bed here at [facility] so the medical team here will now take over her care and determine when she is safe to discharge home or back to a skilled nursing facility.    In a visit lasting 70 minutes, greater than 50% of that time was spent in floor time, discussing her case and coordinating her care.   Nicholos Johns, PA-C    Tyler Pita, MD  [facility] Direct Dial: [phone]  Fax: [phone] [email or web address]  Skype  [email or web address]

## 2020-06-29 NOTE — Progress Notes (Addendum)
CRITICAL VALUE ALERT  Critical Value:  INR 4.6  Date & Time Notied:  06/29/20 0658  Provider Notified: Josephine Cables 06/29/20 0659, Manuella Ghazi 06/29/20 0703  Orders Received/Actions taken: awaiting response, No new orders

## 2020-06-29 NOTE — Progress Notes (Signed)
PROGRESS NOTE    Stacey [name]  ZJQ:[id number] DOB: [birth year] DOA: 06/26/2020 PCP: Patient, No Pcp Per   Brief Narrative:  Per HPI: Stacey Mane Swaneyis a 74 y.o.femalewith medical history significant formetastatic breast cancer, CAD, paroxysmal atrial fibrillation, COPD, and morbid obesity who presented to the ED with worsening left hip pain, despite taking her methadone 3 times daily and oxycodone 10 mg every 3 hours as needed for breakthrough pain. This has been ongoing for the last several days and she finds it very difficult to ambulate or perform any activities of daily living on account of her severe pain. She denies any new trauma, numbness/weakness, fevers or chills, chest pain, shortness of breath, nausea, vomiting, or diarrhea. She continues to follow-up at [facility] with her oncologist for ongoing cancer treatments and would like to be able to make it there for further discussion of ongoing treatment and also discussed with the palliative care team at that facility. Patient currently does not have a safe discharge plan as family cannot care for her at home and she appears to have lost her bed at [facility].  2/40: Patient continues to have significant pain that is exacerbated with any movement and is requiring high doses of oral pain medications as prescribed by palliative care.  Discussed with patient and family regarding the need for radiation treatment and they are now agreeable to going to [facility] for further evaluation and management by radiation oncology.  Discussed case with Dr. Tammi Klippel who is agreeable to seeing her and will evaluate further at [facility] long in a.m.  7/30: Patient will transfer to [facility] today as no beds were available yesterday.  Noted to have some AKI for which gentle fluids will be started as well as monitoring of strict I's and O's and urine electrolytes.  Diuretics and nephrotoxic agents held.  INR continues to remain elevated for which vitamin K  will be given x1 dose.  Further evaluation per radiation oncology.  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 ([place])   Left hip pain secondary to metastatic breast cancer -Previous metastatic bone lesion to L1 with prior radiation history -Noted new sclerotic lesion to left pubic ramus that is the likely culprit -Appears to be quite debilitating and patient cannot ambulate, nor perform ADLs -Safe discharge plan not present as patient has lost her SNF bed and family cannot care for her at home -No other acute findings on imaging studies -Appreciate palliative consultation with pain management recommendations and medication adjustments -Dexamethasone dose increased to 8 mg daily today -Discussed case with Dr. Tammi Klippel who will see patient in a.m. for radiation treatments.  Needs to transfer to [facility] which will be done today.  AKI -Creatinine bumped to 1.1 noted this a.m. -Baseline 0.6-0.9 -Hold nephrotoxic agents to include losartan, spironolactone, and torsemide -Gentle IV fluid to be initiated -Monitor strict I's and O's -Urine electrolytes for further evaluation -Repeat a.m. labs  History of paroxysmal atrial fibrillationwith supratherapeutic INR -Currently rate controlled -Continue metoprolol -Coumadin per pharmacy which will need to be held on account of INR elevation -No signs of bleeding noted  History of systolic and diastolic CHF -Appears euvolemic -Last 2D echocardiogram on 03/2020  with LVEF 35-40% with grade 1 diastolic dysfunction -Hold further diuretics on account of AKI -Monitor daily weights  History of hypertension-controlled -Continue current doses of  losartan and metoprolol -Continue torsemide and spironolactone as noted above  History of CAD -Continue atorvastatin and Plavix as well as metoprolol  Obesity -Lifestyle changes   DVT prophylaxis:Coumadin with supratherapeutic INR-pharmacy following, vitamin K given 7/30 Code Status: DNR Family Communication: Spoke with sisters at bedside on 7/29 Disposition Plan:  Status is: Inpatient  Remains inpatient appropriate because:Ongoing active pain requiring inpatient pain management and Unsafe d/c plan   Dispo: The patient is from: Home  Anticipated d/c is to: Home  Anticipated d/c date is: 2 days  Patient currently is not medically stable to d/c. She continues to remain quite debilitated requiring excessive pain medications for comfort.  She cannot ambulate or perform any activities of daily living and is a high fall risk at home.  Family cannot care for her appropriately.  Patient to transfer to [facility] for radiation treatment.  Consultants:   Palliative care  Radiation oncology  Procedures:   See below  Antimicrobials:   None  Subjective: Patient seen and evaluated today and is noted to be quite somnolent with no acute issues noted overnight.  Objective: Vitals:   06/28/20 1426 06/28/20 2124 06/29/20 0550 06/29/20 0600  BP: (!) 154/64 (!) 116/56 125/76   Pulse: 71 56 73   Resp: 20 18 20    Temp: 98.3 F (36.8 C) (!) 97.5 F (36.4 C)  97.6 F (36.4 C)  TempSrc: Oral Oral    SpO2: 100% 98% 90%   Weight:      Height:        Intake/Output Summary (Last 24 hours) at 06/29/2020 1151 Last data filed at 06/29/2020 0900 Gross per 24 hour  Intake 720 ml  Output 1300 ml  Net -580 ml   Filed Weights   06/27/20 1552  Weight: (!) 92.5 kg    Examination:  General exam: Appears calm and comfortable, obese Respiratory system: Clear to auscultation. Respiratory effort normal. Cardiovascular system: S1 & S2 heard,  RRR.  Gastrointestinal system: Abdomen is soft Central nervous system: Somnolent, but arousable Extremities: No edema Skin: No rashes, lesions or ulcers Psychiatry: Cannot be assessed at this time    Data Reviewed: I have personally reviewed following labs and imaging studies  CBC: Recent Labs  Lab 06/27/20 1422 06/29/20 0445  WBC 6.0 5.2  HGB 11.0* 11.4*  HCT 35.1* 36.4  MCV 101.2* 101.4*  PLT 217 481   Basic Metabolic Panel: Recent Labs  Lab 06/27/20 1422 06/29/20 0445  NA 138 136  K 3.7 4.4  CL 102 96*  CO2 27 29  GLUCOSE 122* 114*  BUN 14 29*  CREATININE 0.62 1.11*  CALCIUM 8.6* 8.8*   GFR: Estimated Creatinine Clearance: 45.8 mL/min (A) (by C-G formula based on SCr of 1.11 mg/dL (H)). Liver Function Tests: No results for input(s): AST, ALT, ALKPHOS, BILITOT, PROT, ALBUMIN in the last 168 hours. No results for input(s): LIPASE, AMYLASE in the last 168 hours. No results for input(s): AMMONIA in the last 168 hours. Coagulation Profile: Recent Labs  Lab 06/27/20 1422 06/28/20 0430 06/29/20 0445  INR 5.1* 4.1* 4.6*   Cardiac Enzymes: No results for input(s): CKTOTAL, CKMB, CKMBINDEX, TROPONINI in the last 168 hours. BNP (last 3 results) No results for input(s): PROBNP in the last 8760 hours. HbA1C: No results for input(s): HGBA1C in the last 72 hours. CBG: No results  for input(s): GLUCAP in the last 168 hours. Lipid Profile: No results for input(s): CHOL, HDL, LDLCALC, TRIG, CHOLHDL, LDLDIRECT in the last 72 hours. Thyroid Function Tests: No results for input(s): TSH, T4TOTAL, FREET4, T3FREE, THYROIDAB in the last 72 hours. Anemia Panel: No results for input(s): VITAMINB12, FOLATE, FERRITIN, TIBC, IRON, RETICCTPCT in the last 72 hours. Sepsis Labs: No results for input(s): PROCALCITON, LATICACIDVEN in the last 168 hours.  No results found for this or any previous visit (from the past 240 hour(s)).       Radiology Studies: No results  found.      Scheduled Meds: . acetaminophen  650 mg Oral Q8H  . atorvastatin  80 mg Oral Daily  . B-complex with vitamin C  1 tablet Oral q morning - 10a  . calcitonin (salmon)  1 spray Alternating Nares Daily  . calcium-vitamin D  1 tablet Oral Daily  . clopidogrel  75 mg Oral Daily  . dexamethasone  8 mg Oral Daily  . DULoxetine  90 mg Oral Daily  . letrozole  2.5 mg Oral QHS  . methadone  5 mg Oral QID  . metoprolol succinate  100 mg Oral Daily  . oxyCODONE-acetaminophen  2 tablet Oral QHS  . phytonadione  1 mg Oral Once  . pregabalin  150 mg Oral Daily  . senna  2 tablet Oral BID  . Warfarin - Pharmacist Dosing Inpatient   Does not apply q1600   Continuous Infusions: . lactated ringers       LOS: 1 day    Time spent: 35 minutes    Hatley Henegar Darleen Crocker, DO [facility]  If 7PM-7AM, please contact night-coverage [email or web address] 06/29/2020, 11:51 AM

## 2020-06-29 NOTE — Progress Notes (Signed)
ANTICOAGULATION CONSULT NOTE -   Pharmacy Consult for warfarin dosing  Indication: atrial fibrillation   Allergies  Allergen Reactions  . Brilinta [Ticagrelor] Other (See Comments)    Nausea and severe diarrhea- general weakness. Patient says does not want to take again  . Albuterol Other (See Comments)    Heart racing       Patient Measurements: Last Weight  Most recent update: 06/27/2020  6:18 PM   Weight  92.5 kg (204 lb)             Body mass index is 39.84 kg/m. Stacey [name]               Temp: 97.6 F (36.4 C) (07/30 0600) Temp Source: Oral (07/29 2124) BP: 125/76 (07/30 0550) Pulse Rate: 73 (07/30 0550)  Labs: Recent Labs    06/27/20 1422 06/28/20 0430 06/29/20 0445  HGB 11.0*  --  11.4*  HCT 35.1*  --  36.4  PLT 217  --  271  LABPROT 45.6* 38.7* 42.5*  INR 5.1* 4.1* 4.6*  CREATININE 0.62  --  1.11*    Estimated Creatinine Clearance: 45.8 mL/min (A) (by C-G formula based on SCr of 1.11 mg/dL (H)).    Goal of Therapy:  INR 2-3 Monitor platelets by anticoagulation protocol: Yes   Concurrent anti-platelet meds: clopidogrel 75mg  daily   Prior to Admission Warfarin Dosing:  Stacey [name] takes 2mg  of warfarin daily     Admit INR was 5.1 Lab Results  Component Value Date   INR 4.6 (HH) 06/29/2020   INR 4.1 (HH) 06/28/2020   INR 5.1 ([place]) 06/27/2020    Assessment: Stacey [name] a 74 y.o. female requires anticoagulation with warfarin for the indication of  atrial fibrillation. Warfarin will be initiated inpatient following pharmacy protocol per pharmacy consult. Patient most recent blood work is as follows: CBC Latest Ref Rng & Units 06/29/2020 06/27/2020 04/26/2020  WBC 4.0 - 10.5 K/uL 5.2 6.0 4.6  Hemoglobin 12.0 - 15.0 g/dL 11.4(L) 11.0(L) 11.6(L)  Hematocrit 36 - 46 % 36.4 35.1(L) 36.9  Platelets 150 - 400 K/uL 271 217 189    Plan: Hold warfarin again today for INR 4.6 Monitor CBC MWF with am labs   Monitor INR daily Monitor  for signs and symptoms of bleeding   Isac Sarna, BS [place], BCPS Clinical Pharmacist Pager [phone] 06/29/2020 8:48 AM

## 2020-06-30 LAB — BASIC METABOLIC PANEL
Anion gap: 9 (ref 5–15)
BUN: 41 mg/dL — ABNORMAL HIGH (ref 8–23)
CO2: 32 mmol/L (ref 22–32)
Calcium: 8.5 mg/dL — ABNORMAL LOW (ref 8.9–10.3)
Chloride: 96 mmol/L — ABNORMAL LOW (ref 98–111)
Creatinine, Ser: 1.04 mg/dL — ABNORMAL HIGH (ref 0.44–1.00)
GFR calc Af Amer: >60 mL/min (ref >60)
GFR calc non Af Amer: 53 mL/min — ABNORMAL LOW (ref >60)
Glucose, Bld: 110 mg/dL — ABNORMAL HIGH (ref 70–99)
Potassium: 3.9 mmol/L (ref 3.5–5.1)
Sodium: 137 mmol/L (ref 135–145)

## 2020-06-30 LAB — PROTIME-INR
INR: 3 — ABNORMAL HIGH (ref 0.8–1.2)
Prothrombin Time: 30.5 seconds — ABNORMAL HIGH (ref 11.4–15.2)

## 2020-06-30 MED ORDER — WARFARIN SODIUM 3 MG PO TABS
3.0000 mg | ORAL_TABLET | Freq: Every day | ORAL | Status: DC
  Administered 2020-06-30: 3 mg via ORAL
  Filled 2020-06-30 (×2): qty 1

## 2020-06-30 MED ORDER — LACTATED RINGERS IV SOLN: INTRAVENOUS | Status: AC

## 2020-06-30 NOTE — Progress Notes (Signed)
PROGRESS NOTE    Stacey [name]  JHE:[id number] DOB: [birth year] DOA: 06/26/2020 PCP: Patient, No Pcp Per  Chief Complaint  Patient presents with  . Hip Pain    Brief Narrative:  Per HPI: Stacey [name] 74 y.o.femalewith medical history significant formetastatic breast cancer, CAD, paroxysmal atrial fibrillation, COPD, and morbid obesity who presented to the ED with worsening left hip pain, despite taking her methadone 3 times daily and oxycodone 10 mg every 3 hours as needed for breakthrough pain. This has been ongoing for the last several days and she finds it very difficult to ambulate or perform any activities of daily living on account of her severe pain. She denies any new trauma, numbness/weakness, fevers or chills, chest pain, shortness of breath, nausea, vomiting, or diarrhea. She continues to follow-up at [facility] with her oncologist for ongoing cancer treatments and would like to be able to make it there for further discussion of ongoing treatment and also discussed with the palliative care team at that facility. Patient currently does not have Stacey [name] safe discharge plan as family cannot care for her at home and she appears to have lost her bed at [facility].  1/85:UDJSHFW continues to have significant pain that is exacerbated with any movement and is requiring high doses of oral pain medications as prescribed by palliative care. Discussed with patient and family regarding the need for radiation treatment and they are now agreeable to going to [facility] for further evaluation and management by radiation oncology.Discussed case with Dr. Tammi [name] who is agreeable to seeing her and will evaluate further at [facility] long in [place].m.  7/30: Patient will transfer to [facility] today as no beds were available yesterday.  Noted to have some AKI for which gentle fluids will be started as well as monitoring of strict I's and O's and urine electrolytes.  Diuretics and nephrotoxic agents  held.  INR continues to remain elevated for which vitamin K will be given x1 dose.  Further evaluation per radiation oncology.  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 Tammi [name] from Radiation oncology yesterday - s/p CT simulation/treatment planning followed by single fraction of palliative radiotherapy - continued pain today, will continue to monitor after radiation therapy  AKI -Creatinine bumped to 1.1 noted 7/30 -relatively stable today, 1.04 - continue IVF - follow - urine lytes suggest prerenal  -Hold nephrotoxic agents to include losartan, spironolactone, and torsemide -Monitor strict I's and O's, daily weights  History of paroxysmal atrial fibrillationwith supratherapeutic INR -Currently rate controlled -Continue metoprolol -Coumadin per pharmacy (s/p vitamin K yesterday for elevated INR) -No signs of bleeding noted  History of systolic and diastolic CHF -Appears euvolemic -Last  2D echocardiogram on 03/2020 with LVEF 35-40% with grade 1 diastolic dysfunction -Hold further diuretics on  account of AKI -Monitor daily weights, strict I/O  History of hypertension-controlled -Continue current doses of losartan and metoprolol -Continue torsemide and spironolactone as noted above  History of CAD -Continue atorvastatin and Plavix as well as metoprolol  Obesity -Lifestyle changes  DVT prophylaxis: warfarin  Code Status: DNR Family Communication: sister Disposition:   Status is: Inpatient  Remains inpatient appropriate because:Inpatient level of care appropriate due to severity of illness   Dispo: The patient is from: Home              Anticipated d/c is to: SNF              Anticipated d/c date is: > 3 days              Patient currently is not medically stable to d/c.   Consultants:   Radiation oncology  Palliative care  Procedures:   none  Antimicrobials:  Anti-infectives (From admission, onward)   None      Subjective: Pain not improved  Objective: Vitals:   06/29/20 1225 06/29/20 1629 06/29/20 2005 06/30/20 0700  BP: (!) 143/61 (!) 124/54 (!) 126/62 (!) 126/54  Pulse: 58 64 68 63  Resp: 12 14 15 18   Temp: 98.2 F (36.8 C) 97.6 F (36.4 C) 97.8 F (36.6 C) 98.2 F (36.8 C)  TempSrc: Oral Oral Oral   SpO2: 99% (!) 88% 94% 95%  Weight: 91.3 kg     Height: 5' (1.524 m)       Intake/Output Summary (Last 24 hours) at 06/30/2020 1342 Last data filed at 06/29/2020 1832 Gross per 24 hour  Intake 360 ml  Output 200 ml  Net 160 ml   Filed Weights   06/27/20 1552 06/29/20 1225  Weight: (!) 92.5 kg 91.3 kg    Examination:  General exam: Appears calm and comfortable  Respiratory system: Clear to auscultation. Respiratory effort normal. Cardiovascular system: S1 & S2 heard, RRR.  Gastrointestinal system: Abdomen is nondistended, soft and nontender. Central nervous system: Alert and oriented. No focal neurological deficits. Extremities: no LEE - TTP at L hip Skin: No rashes, lesions or ulcers Psychiatry: Judgement and insight appear  normal. Mood & affect appropriate.     Data Reviewed: I have personally reviewed following labs and imaging studies  CBC: Recent Labs  Lab 06/27/20 1422 06/29/20 0445  WBC 6.0 5.2  HGB 11.0* 11.4*  HCT 35.1* 36.4  MCV 101.2* 101.4*  PLT 217 588    Basic Metabolic Panel: Recent Labs  Lab 06/27/20 1422 06/29/20 0445 06/30/20 0623  NA 138 136 137  K 3.7 4.4 3.9  CL 102 96* 96*  CO2 27 29 32  GLUCOSE 122* 114* 110*  BUN 14 29* 41*  CREATININE 0.62 1.11* 1.04*  CALCIUM 8.6* 8.8* 8.5*    GFR: Estimated Creatinine Clearance: 48.5 mL/min ([place]) (by C-G formula based on SCr of 1.04 mg/dL (H)).  Liver Function Tests: No results for input(s): AST, ALT, ALKPHOS, BILITOT, PROT, ALBUMIN in the last 168 hours.  CBG: No results for input(s): GLUCAP in the last 168 hours.   No results found for this or any previous visit (from the past 240 hour(s)).       Radiology Studies: No results found.      Scheduled Meds: . acetaminophen  650 mg Oral Q8H  . atorvastatin  80 mg Oral Daily  . B-complex with vitamin C  1 tablet Oral q morning - 10a  . calcitonin (salmon)  1 spray Alternating Nares Daily  . calcium-vitamin D  1 tablet Oral Daily  . clopidogrel  75 mg Oral Daily  . dexamethasone  8 mg Oral Daily  . DULoxetine  90 mg Oral Daily  . letrozole  2.5 mg Oral QHS  . methadone  5 mg Oral QID  . metoprolol succinate  100 mg Oral Daily  . oxyCODONE-acetaminophen  2 tablet Oral QHS  . pregabalin  150 mg Oral Daily  . senna  2 tablet Oral BID  . warfarin  3 mg Oral q1600  . Warfarin - Pharmacist Dosing Inpatient   Does not apply q1600   Continuous Infusions:   LOS: 2 days    Time spent: over 30 min    Fayrene Helper, MD [facility]   To contact the attending provider between 7A-7P or the covering provider during after hours 7P-7A, please log into the web site [email or web address] and access using universal [place] password for that web site. If you do  not have the password, please call the hospital operator.  06/30/2020, 1:42 PM

## 2020-06-30 NOTE — Progress Notes (Signed)
ANTICOAGULATION CONSULT NOTE - Initial Consult  Pharmacy Consult for warfarin Indication: atrial fibrillation  Allergies  Allergen Reactions  . Brilinta [Ticagrelor] Other (See Comments)    Nausea and severe diarrhea- general weakness. Patient says does not want to take again  . Albuterol Other (See Comments)    Heart racing     Patient Measurements: Height: 5' (152.4 cm) Weight: 91.3 kg (201 lb 3.2 oz) IBW/kg (Calculated) : 45.5  Vital Signs: Temp: 98.2 F (36.8 C) (07/31 0700) BP: 126/54 (07/31 0700) Pulse Rate: 63 (07/31 0700)  Labs: Recent Labs    06/27/20 1422 06/27/20 1422 06/28/20 0430 06/29/20 0445 06/30/20 0623  HGB 11.0*  --   --  11.4*  --   HCT 35.1*  --   --  36.4  --   PLT 217  --   --  271  --   LABPROT 45.6*   < > 38.7* 42.5* 30.5*  INR 5.1*   < > 4.1* 4.6* 3.0*  CREATININE 0.62  --   --  1.11* 1.04*   < > = values in this interval not displayed.    Estimated Creatinine Clearance: 48.5 mL/min (A) (by C-G formula based on SCr of 1.04 mg/dL (H)).   Medical History: Past Medical History:  Diagnosis Date  . CAD in native artery    a. DES to ramus 2005 with late stent thrombosis 2006 tx with PTCA. 12/18 PCI/DES x1 to mRCA, EF 50-55%  . CHF (congestive heart failure) ([place])   . Chronic pain   . COPD (chronic obstructive pulmonary disease) ([place])   . Hyperlipidemia   . Hypertension   . Left bundle branch block   . Lymphedema   . Metastatic breast cancer ([place])    a. to bone.  . MI (myocardial infarction) ([place])   . Mild aortic stenosis 10/2017  . Morbid obesity ([place])   . PAF (paroxysmal atrial fibrillation) ([place])   . PSVT (paroxysmal supraventricular tachycardia) ([place])    a. per [facility] notes, seen on event monitor in 2014.  . Pulmonary nodules     Medications:  Warfarin 2 mg daily PTA  Assessment: 74 y/o F with a h/o , atrial fibrillation on warfarin, metastatic breast cancer, and chronic pain admitted to [facility] for palliative radiation therapy  for hip pain. INR elevated on admission. Patient received vitamin K 2.5 mg po 7/30. Anticipate prolonged time to goal INR. Not clear why vitamin K was given with no bleeding, no plans for surgery, and INR of 4.   Goal of Therapy:  INR 2-3 Monitor platelets by anticoagulation protocol: Yes   Plan:  Resume warfarin 3 mg daily.  Daily PT/INR   Ulice Dash D 06/30/2020,10:45 AM

## 2020-06-30 NOTE — Progress Notes (Signed)
Family requested that patients two sisters and her son be allowed to visit as they were at [facility]. Merry Proud, [facility] approved the exception due to the fact that patient is receiving Palliative XRT.

## 2020-07-01 LAB — BASIC METABOLIC PANEL
Anion gap: 7 (ref 5–15)
BUN: 33 mg/dL — ABNORMAL HIGH (ref 8–23)
CO2: 34 mmol/L — ABNORMAL HIGH (ref 22–32)
Calcium: 8.4 mg/dL — ABNORMAL LOW (ref 8.9–10.3)
Chloride: 98 mmol/L (ref 98–111)
Creatinine, Ser: 0.91 mg/dL (ref 0.44–1.00)
GFR calc Af Amer: >60 mL/min (ref >60)
GFR calc non Af Amer: >60 mL/min (ref >60)
Glucose, Bld: 110 mg/dL — ABNORMAL HIGH (ref 70–99)
Potassium: 4.6 mmol/L (ref 3.5–5.1)
Sodium: 139 mmol/L (ref 135–145)

## 2020-07-01 LAB — CBC
HCT: 33.9 % — ABNORMAL LOW (ref 36.0–46.0)
Hemoglobin: 10.5 g/dL — ABNORMAL LOW (ref 12.0–15.0)
MCH: 31.7 pg (ref 26.0–34.0)
MCHC: 31 g/dL (ref 30.0–36.0)
MCV: 102.4 fL — ABNORMAL HIGH (ref 80.0–100.0)
Platelets: 249 10*3/uL (ref 150–400)
RBC: 3.31 MIL/uL — ABNORMAL LOW (ref 3.87–5.11)
RDW: 14.4 % (ref 11.5–15.5)
WBC: 6.3 10*3/uL (ref 4.0–10.5)
nRBC: 0 % (ref 0.0–0.2)

## 2020-07-01 LAB — PROTIME-INR
INR: 1.5 — ABNORMAL HIGH (ref 0.8–1.2)
Prothrombin Time: 17.4 seconds — ABNORMAL HIGH (ref 11.4–15.2)

## 2020-07-01 MED ORDER — WARFARIN SODIUM 5 MG PO TABS
5.0000 mg | ORAL_TABLET | Freq: Once | ORAL | Status: AC
  Administered 2020-07-01: 5 mg via ORAL
  Filled 2020-07-01: qty 1

## 2020-07-01 NOTE — Progress Notes (Signed)
PROGRESS NOTE    Stacey [name]  WIO:[id number] DOB: 04-Aug-1946 DOA: 06/26/2020 PCP: Patient, No Pcp Per  Chief Complaint  Patient presents with  . Hip Pain    Brief Narrative:  Per HPI: Stacey [name] 74 y.o.femalewith medical history significant formetastatic breast cancer, CAD, paroxysmal atrial fibrillation, COPD, and morbid obesity who presented to the ED with worsening left hip pain, despite taking her methadone 3 times daily and oxycodone 10 mg every 3 hours as needed for breakthrough pain. This has been ongoing for the last several days and she finds it very difficult to ambulate or perform any activities of daily living on account of her severe pain. She denies any new trauma, numbness/weakness, fevers or chills, chest pain, shortness of breath, nausea, vomiting, or diarrhea. She continues to follow-up at [name] with her oncologist for ongoing cancer treatments and would like to be able to make it there for further discussion of ongoing treatment and also discussed with the palliative [name] team at that facility. Patient currently does not have [name] safe discharge plan as family cannot [name] for her at home and she appears to have lost her bed at [name].  3/84:TXMIWOE continues to have significant pain that is exacerbated with any movement and is requiring high doses of oral pain medications as prescribed by palliative [name]. Discussed with patient and family regarding the need for radiation treatment and they are now agreeable to going to [name] for further evaluation and management by radiation oncology.Discussed case with Dr. Tammi [name] who is agreeable to seeing her and will evaluate further at [name] in [name].m.  7/30: Patient will transfer to [facility] today as no beds were available yesterday.  Noted to have some AKI for which gentle fluids will be started as well as monitoring of strict I's and O's and urine electrolytes.  Diuretics and nephrotoxic agents  held.  INR continues to remain elevated for which vitamin K will be given x1 dose.  Further evaluation per radiation oncology.  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 (Stacey [name])  Left hip pain secondary to metastatic breast cancer -Previous metastatic bone lesion to L1 with prior radiation history -Noted new sclerotic lesion to left pubic ramusthat is the likely culprit -Appears to be quite debilitating and patient cannot ambulate, nor perform ADLs -Safe discharge plan not present as patient has lost her SNF bed and family cannot [name] for her at home -Nootheracute findings on imaging studies -Appreciate palliative consultation with pain management recommendationsand medication adjustments -Dexamethasone dose increased to 8 mg daily today -Seen by Dr. Tammi [name] from Radiation oncology yesterday - s/p CT simulation/treatment planning followed by single fraction of palliative radiotherapy - continued pain today, but she notes improvement, will continue to monitor after radiation therapy  AKI -Creatinine bumped to 1.1 noted 7/30 -slow downtrend, 0.91 today - continue IVF - follow - urine lytes suggest prerenal  -Hold nephrotoxic agents to include losartan, spironolactone, and torsemide -Monitor strict I's and O's, daily weights  History of paroxysmal atrial fibrillationwith supratherapeutic INR -Currently rate controlled -Continue metoprolol -Coumadin per pharmacy - subtherapeutic today after vitamin K, continue to monitor -No signs of bleeding noted  History of systolic  and diastolic CHF -Appears euvolemic -Last 2D echocardiogram on 03/2020 with LVEF 35-40% with grade 1 diastolic  dysfunction -Hold further diuretics on account of AKI -Monitor daily weights, strict I/O  History of hypertension-controlled -Continue current doses of losartan and metoprolol -Continue torsemide and spironolactone as noted above  History of CAD -Continue atorvastatin and Plavix as well as metoprolol  Obesity -Lifestyle changes  DVT prophylaxis: warfarin  Code Status: DNR Family Communication: sister Disposition:   Status is: Inpatient  Remains inpatient appropriate because:Inpatient level of [name] appropriate due to severity of illness   Dispo: The patient is from: Home              Anticipated d/c is to: SNF              Anticipated d/c date is: > 3 days              Patient currently is not medically stable to d/c.   Consultants:   Radiation oncology  Palliative [name]  Procedures:   none  Antimicrobials:  Anti-infectives (From admission, onward)   None      Subjective: Pain improving today  Objective: Vitals:   06/30/20 1518 06/30/20 2148 07/01/20 0600 07/01/20 1430  BP: (!) 142/70 (!) 142/61 (!) 137/64 (!) 142/49  Pulse: 66 63 61 64  Resp: 15 16 15 16   Temp: 98.2 F (36.8 C) 97.8 F (36.6 C) 97.6 F (36.4 C) 98 F (36.7 C)  TempSrc: Oral Oral Oral Oral  SpO2: 95% 92% 94% 97%  Weight:      Height:        Intake/Output Summary (Last 24 hours) at 07/01/2020 1709 Last data filed at 07/01/2020 0634 Gross per 24 hour  Intake 928.75 ml  Output 1100 ml  Net -171.25 ml   Filed Weights   06/27/20 1552 06/29/20 1225  Weight: (!) 92.5 kg 91.3 kg    Examination:  General: No acute distress. Cardiovascular: Heart sounds show Stacey [name] regular rate, and rhythm.  Lungs: Clear to auscultation bilaterally  Abdomen: Soft, nontender, nondistended Neurological: Alert and oriented 3. Moves all extremities 4. Cranial nerves II through XII grossly intact. Skin: Warm and dry. No rashes or lesions. Extremities: improved L hip pain     Data Reviewed: I have  personally reviewed following labs and imaging studies  CBC: Recent Labs  Lab 06/27/20 1422 06/29/20 0445 07/01/20 1222  WBC 6.0 5.2 6.3  HGB 11.0* 11.4* 10.5*  HCT 35.1* 36.4 33.9*  MCV 101.2* 101.4* 102.4*  PLT 217 271 588    Basic Metabolic Panel: Recent Labs  Lab 06/27/20 1422 06/29/20 0445 06/30/20 0623 07/01/20 1222  NA 138 136 137 139  K 3.7 4.4 3.9 4.6  CL 102 96* 96* 98  CO2 27 29 32 34*  GLUCOSE 122* 114* 110* 110*  BUN 14 29* 41* 33*  CREATININE 0.62 1.11* 1.04* 0.91  CALCIUM 8.6* 8.8* 8.5* 8.4*    GFR: Estimated Creatinine Clearance: 55.5 mL/min (by C-G formula based on SCr of 0.91 mg/dL).  Liver Function Tests: No results for input(s): AST, ALT, ALKPHOS, BILITOT, PROT, ALBUMIN in the last 168 hours.  CBG: No results for input(s): GLUCAP in the last 168 hours.   No results found for this or any previous visit (from the past 240 hour(s)).       Radiology Studies: No results found.      Scheduled Meds: . acetaminophen  650 mg Oral Q8H  . atorvastatin  80 mg Oral Daily  .  B-complex with vitamin C  1 tablet Oral q morning - 10a  . calcitonin (salmon)  1 spray Alternating Nares Daily  . calcium-vitamin D  1 tablet Oral Daily  . clopidogrel  75 mg Oral Daily  . dexamethasone  8 mg Oral Daily  . DULoxetine  90 mg Oral Daily  . letrozole  2.5 mg Oral QHS  . methadone  5 mg Oral QID  . metoprolol succinate  100 mg Oral Daily  . oxyCODONE-acetaminophen  2 tablet Oral QHS  . pregabalin  150 mg Oral Daily  . senna  2 tablet Oral BID  . warfarin  5 mg Oral ONCE-1600  . Warfarin - Pharmacist Dosing Inpatient   Does not apply q1600   Continuous Infusions:   LOS: 3 days    Time spent: over 30 min    Fayrene Helper, MD [facility]   To contact the attending provider between 7A-7P or the covering provider during after hours 7P-7A, please log into the web site [email or web address] and access using universal [place] password for that  web site. If you do not have the password, please call the [name] operator.  07/01/2020, 5:09 PM

## 2020-07-01 NOTE — Progress Notes (Signed)
ANTICOAGULATION CONSULT NOTE - Initial Consult  Pharmacy Consult for warfarin Indication: atrial fibrillation  Allergies  Allergen Reactions  . Brilinta [Ticagrelor] Other (See Comments)    Nausea and severe diarrhea- general weakness. Patient says does not want to take again  . Albuterol Other (See Comments)    Heart racing     Patient Measurements: Height: 5' (152.4 cm) Weight: 91.3 kg (201 lb 3.2 oz) IBW/kg (Calculated) : 45.5  Vital Signs: Temp: 97.6 F (36.4 C) (08/01 0600) Temp Source: Oral (08/01 0600) BP: 137/64 (08/01 0600) Pulse Rate: 61 (08/01 0600)  Labs: Recent Labs    06/29/20 0445 06/30/20 0623 07/01/20 0733  HGB 11.4*  --   --   HCT 36.4  --   --   PLT 271  --   --   LABPROT 42.5* 30.5* 17.4*  INR 4.6* 3.0* 1.5*  CREATININE 1.11* 1.04*  --     Estimated Creatinine Clearance: 48.5 mL/min (A) (by C-G formula based on SCr of 1.04 mg/dL (H)).   Medical History: Past Medical History:  Diagnosis Date  . CAD in native artery    a. DES to ramus 2005 with late stent thrombosis 2006 tx with PTCA. 12/18 PCI/DES x1 to mRCA, EF 50-55%  . CHF (congestive heart failure) ([place])   . Chronic pain   . COPD (chronic obstructive pulmonary disease) ([place])   . Hyperlipidemia   . Hypertension   . Left bundle branch block   . Lymphedema   . Metastatic breast cancer ([place])    a. to bone.  . MI (myocardial infarction) ([place])   . Mild aortic stenosis 10/2017  . Morbid obesity ([place])   . PAF (paroxysmal atrial fibrillation) ([place])   . PSVT (paroxysmal supraventricular tachycardia) ([place])    a. per [facility] notes, seen on event monitor in 2014.  . Pulmonary nodules     Medications:  Warfarin 2 mg daily PTA  Assessment: 74 y/o F with a h/o , atrial fibrillation on warfarin, metastatic breast cancer, and chronic pain admitted to [facility] for palliative radiation therapy for hip pain. INR elevated on admission. Patient received vitamin K 2.5 mg po 7/30. Anticipate prolonged time  to goal INR. Not clear why vitamin K was given with no bleeding, no plans for surgery, and INR of 4.   07/01/20 9:41 AM   INR 1.5  No bleeding reported  Goal of Therapy:  INR 2-3 Monitor platelets by anticoagulation protocol: Yes   Plan:  Warfarin 5 mg today Daily PT/INR   Ulice Dash D 07/01/2020,9:41 AM

## 2020-07-02 LAB — COMPREHENSIVE METABOLIC PANEL
ALT: 11 U/L (ref 0–44)
AST: 20 U/L (ref 15–41)
Albumin: 3.2 g/dL — ABNORMAL LOW (ref 3.5–5.0)
Alkaline Phosphatase: 42 U/L (ref 38–126)
Anion gap: 9 (ref 5–15)
BUN: 32 mg/dL — ABNORMAL HIGH (ref 8–23)
CO2: 31 mmol/L (ref 22–32)
Calcium: 8.4 mg/dL — ABNORMAL LOW (ref 8.9–10.3)
Chloride: 100 mmol/L (ref 98–111)
Creatinine, Ser: 0.94 mg/dL (ref 0.44–1.00)
GFR calc Af Amer: >60 mL/min (ref >60)
GFR calc non Af Amer: >60 mL/min (ref >60)
Glucose, Bld: 105 mg/dL — ABNORMAL HIGH (ref 70–99)
Potassium: 4.2 mmol/L (ref 3.5–5.1)
Sodium: 140 mmol/L (ref 135–145)
Total Bilirubin: 0.7 mg/dL (ref 0.3–1.2)
Total Protein: 6.2 g/dL — ABNORMAL LOW (ref 6.5–8.1)

## 2020-07-02 LAB — CBC
HCT: 32.5 % — ABNORMAL LOW (ref 36.0–46.0)
Hemoglobin: 10.2 g/dL — ABNORMAL LOW (ref 12.0–15.0)
MCH: 32.2 pg (ref 26.0–34.0)
MCHC: 31.4 g/dL (ref 30.0–36.0)
MCV: 102.5 fL — ABNORMAL HIGH (ref 80.0–100.0)
Platelets: 247 10*3/uL (ref 150–400)
RBC: 3.17 MIL/uL — ABNORMAL LOW (ref 3.87–5.11)
RDW: 14.4 % (ref 11.5–15.5)
WBC: 4.9 10*3/uL (ref 4.0–10.5)
nRBC: 0 % (ref 0.0–0.2)

## 2020-07-02 LAB — PROTIME-INR
INR: 2 — ABNORMAL HIGH (ref 0.8–1.2)
Prothrombin Time: 22.3 seconds — ABNORMAL HIGH (ref 11.4–15.2)

## 2020-07-02 MED ORDER — WARFARIN SODIUM 3 MG PO TABS
3.0000 mg | ORAL_TABLET | Freq: Once | ORAL | Status: AC
  Administered 2020-07-02: 3 mg via ORAL
  Filled 2020-07-02: qty 1

## 2020-07-02 MED ORDER — LIDOCAINE 5 % EX PTCH
2.0000 | MEDICATED_PATCH | CUTANEOUS | Status: DC
  Administered 2020-07-02 – 2020-07-04 (×3): 2 via TRANSDERMAL
  Filled 2020-07-02 (×3): qty 2

## 2020-07-02 MED ORDER — CLONAZEPAM 1 MG PO TBDP
1.0000 mg | ORAL_TABLET | Freq: Three times a day (TID) | ORAL | Status: DC | PRN
  Administered 2020-07-04: 1 mg via ORAL
  Filled 2020-07-02: qty 1

## 2020-07-02 NOTE — Care Management Important Message (Signed)
Important Message  Patient Details IM Letter given to the Patient Name: Stacey [name] MRN: [id number] Date of Birth: May 12, 1946   Medicare Important Message Given:  Yes     Kerin Salen 07/02/2020, 1:10 PM

## 2020-07-02 NOTE — Progress Notes (Signed)
Daily Progress Note   Patient Name: Stacey [name]       Date: 07/02/2020 DOB: December 06, 1945  Age: 74 y.o. MRN#: [id number] Attending Physician: Elodia Florence., * Primary Care Physician: Patient, No Pcp Per Admit Date: 06/26/2020  Reason for Consultation/Follow-up: Establishing goals of care and Pain control  Subjective: Has completed 1/1 planned fraction of radiation. She is noting having worked with physical therapy earlier today. She was able to walk the distance that she has to walk to the bathroom at her house. She relays having some hip discomfort upon turning around to go back to her room.  We discussed her overall pain control. She implies that her pain is fairly well controlled. We discussed options for relieving her hip discomfort that she is having after her PT work this morning and she is interested in trying the lidocaine patches. She is eating and sleeping well. Bowels are moving well.   Length of Stay: 4  Current Medications: Scheduled Meds:  . acetaminophen  650 mg Oral Q8H  . atorvastatin  80 mg Oral Daily  . B-complex with vitamin C  1 tablet Oral q morning - 10a  . calcitonin (salmon)  1 spray Alternating Nares Daily  . calcium-vitamin D  1 tablet Oral Daily  . clopidogrel  75 mg Oral Daily  . dexamethasone  8 mg Oral Daily  . DULoxetine  90 mg Oral Daily  . letrozole  2.5 mg Oral QHS  . lidocaine  2 patch Transdermal Q24H  . methadone  5 mg Oral QID  . metoprolol succinate  100 mg Oral Daily  . oxyCODONE-acetaminophen  2 tablet Oral QHS  . pregabalin  150 mg Oral Daily  . senna  2 tablet Oral BID  . Warfarin - Pharmacist Dosing Inpatient   Does not apply q1600    Continuous Infusions:   PRN Meds: albuterol, clonazepam, ondansetron **OR** ondansetron  (ZOFRAN) IV, oxyCODONE  Physical Exam Vitals and nursing note reviewed.  Cardiovascular:     Rate and Rhythm: Normal rate.  Pulmonary:     Effort: Pulmonary effort is normal.  Musculoskeletal:        General: Tenderness present.     Comments: Left lower back/buttocks tenderness  Skin:    General: Skin is warm and dry.  Neurological:  Mental Status: She is alert.     Motor: Weakness present.             Vital Signs: BP (!) 149/65 (BP Location: Right Arm)   Pulse 63   Temp 97.7 F (36.5 C) (Oral)   Resp 20   Ht 5' (1.524 m)   Wt 91.3 kg   SpO2 96%   BMI 39.29 kg/m  SpO2: SpO2: 96 % O2 Device: O2 Device: Room Air O2 Flow Rate:    Intake/output summary:   Intake/Output Summary (Last 24 hours) at 07/02/2020 1634 Last data filed at 07/02/2020 1150 Gross per 24 hour  Intake --  Output 1700 ml  Net -1700 ml   LBM: Last BM Date: 07/01/20 Baseline Weight: Weight: (!) 92.5 kg Most recent weight: Weight: 91.3 kg       Palliative Assessment/Data: PPS: 40%     Patient Active Problem List   Diagnosis Date Noted  . 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 Left hip pain   . Pain of metastatic malignancy 06/27/2020  . Malignant neoplasm metastatic to bone ([place])   . Malignant neoplasm of right breast in female, estrogen receptor positive ([place])   . Goals of care, counseling/discussion   . Advanced care planning/counseling discussion   . Spinal stenosis of lumbar region   . Bone metastases ([place])   . Palliative care by specialist   . Lumbar pain 04/24/2020  . Lumbar radiculopathy 02/16/2020  . Palpitations 11/16/2019  . Dysuria   . Acute bilateral low back pain 10/02/2019  . Bipolar 2 disorder, major depressive episode ([place]) 09/01/2019  . Adjustment disorder with depressed mood 06/30/2019  . Encounter for therapeutic drug monitoring 12/28/2018  . Atrial fibrillation with rapid ventricular response ([place]) 11/14/2018  . Fall   . Obesity, Class III,  BMI 40-49.9 (morbid obesity) ([place])   . Chronic respiratory failure with hypoxia ([place])   . Elevated troponin 10/07/2018  . PSVT (paroxysmal supraventricular tachycardia) ([place]) 10/07/2018  . Hyperlipidemia 10/07/2018  . COPD (chronic obstructive pulmonary disease) ([place]) 10/07/2018  . CAD in native artery 10/07/2018  . Chronic pain 10/07/2018  . Acute back pain 10/07/2018  . Acute hypoxemic respiratory failure ([place]) 09/19/2018  . Acute on chronic combined systolic and diastolic CHF (congestive heart failure) ([place]) 09/19/2018  . COPD with acute exacerbation ([place]) 09/19/2018  . Dyspnea 11/30/2017  . Non-ST elevation (NSTEMI) myocardial infarction ([place])   . Hypertension 11/23/2017  . Left bundle branch block 11/23/2017  . Acute on chronic diastolic CHF (congestive heart failure) ([place]) 11/23/2017  . CAD (coronary artery disease) 11/23/2017  . Metastatic breast cancer ([place]) 11/23/2017  . Acute respiratory failure with hypoxia ([place]) 11/23/2017  . Atypical nevus 07/29/2016  . Age-related nuclear cataract, bilateral 02/12/2016  . Anxiety and depression 01/24/2016  . Arthropathy, lower leg 09/14/2013  . Tibialis tendinitis 02/22/2013  . 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 Recent admission and discharge to SNF now presenting to ED for disabling pain in the L hip/groin area. Noted xray on 7/25 showing sclerotic lesion on "L interior pubic ramus, corresponding to a sclerotic region on CT likely a sclerotic metastasis". Palliative medicine consulted for symptom management and goals of care.  Assessment/Recommendations/Plan   Continue prn oxycodone to 15mg  q3hr prn, methadone 5mg  4x daily,  percocet 10mg  qhs  Continue dexamethasone to 8mg  daily  Lidocaine patches to bilateral hips q12h  Lyrica 150mg  daily  Klonopin 1mg  prn TID prn for anxiety  Recommend home equipment- bed, bedside toilet, home PT/OT  Will need close follow up with her primary palliative care team and Oncology team at [facility] after discharge   Goals of Care and Additional Recommendations:  Limitations on Scope of Treatment: Full Scope Treatment  Code Status:  DNR  Prognosis:   Unable to determine  Discharge Planning:  Home with Home Health  Thank you for allowing the Palliative Medicine Team to assist in the care of this patient.   Time In: 1530 Time Out: 1605 Total Time 35 mins Prolonged Time Billed no      Greater than 50%  of this time was spent counseling and coordinating care related to the above assessment and plan.  Mariana Kaufman, AGNP-C Palliative Medicine   Please contact Palliative Medicine Team phone at [phone] for questions and concerns.

## 2020-07-02 NOTE — Plan of Care (Signed)
Pt VS WNL this am.  Pain rated 9/10.  Administered PRN pain med per [facility].   Problem: Education: Goal: Knowledge of General Education information will improve Description: Including pain rating scale, medication(s)/side effects and non-pharmacologic comfort measures Outcome: Progressing   Problem: Health Behavior/Discharge Planning: Goal: Ability to manage health-related needs will improve Outcome: Progressing   Problem: Clinical Measurements: Goal: Ability to maintain clinical measurements within normal limits will improve Outcome: Progressing Goal: Will remain free from infection Outcome: Progressing Goal: Diagnostic test results will improve Outcome: Progressing Goal: Respiratory complications will improve Outcome: Progressing Goal: Cardiovascular complication will be avoided Outcome: Progressing   Problem: Nutrition: Goal: Adequate nutrition will be maintained Outcome: Progressing   Problem: Coping: Goal: Level of anxiety will decrease Outcome: Progressing   Problem: Elimination: Goal: Will not experience complications related to bowel motility Outcome: Progressing Goal: Will not experience complications related to urinary retention Outcome: Progressing   Problem: Safety: Goal: Ability to remain free from injury will improve Outcome: Progressing   Problem: Skin Integrity: Goal: Risk for impaired skin integrity will decrease Outcome: Progressing   Problem: Activity: Goal: Risk for activity intolerance will decrease Outcome: Not Progressing   Problem: Pain Managment: Goal: General experience of comfort will improve Outcome: Not Progressing

## 2020-07-02 NOTE — Progress Notes (Signed)
ANTICOAGULATION CONSULT NOTE - Initial Consult  Pharmacy Consult for warfarin Indication: atrial fibrillation  Allergies  Allergen Reactions  . Brilinta [Ticagrelor] Other (See Comments)    Nausea and severe diarrhea- general weakness. Patient says does not want to take again  . Albuterol Other (See Comments)    Heart racing     Patient Measurements: Height: 5' (152.4 cm) Weight: 91.3 kg (201 lb 3.2 oz) IBW/kg (Calculated) : 45.5  Vital Signs: Temp: 97.5 F (36.4 C) (08/02 0556) Temp Source: Oral (08/02 0556) BP: 145/63 (08/02 0556) Pulse Rate: 55 (08/02 0556)  Labs: Recent Labs    06/30/20 0623 07/01/20 0733 07/01/20 1222 07/02/20 0633  HGB  --   --  10.5* 10.2*  HCT  --   --  33.9* 32.5*  PLT  --   --  249 247  LABPROT 30.5* 17.4*  --  22.3*  INR 3.0* 1.5*  --  2.0*  CREATININE 1.04*  --  0.91 0.94   Estimated Creatinine Clearance: 53.7 mL/min (by C-G formula based on SCr of 0.94 mg/dL).  Medical History: Past Medical History:  Diagnosis Date  . CAD in native artery    a. DES to ramus 2005 with late stent thrombosis 2006 tx with PTCA. 12/18 PCI/DES x1 to mRCA, EF 50-55%  . CHF (congestive heart failure) ([place])   . Chronic pain   . COPD (chronic obstructive pulmonary disease) ([place])   . Hyperlipidemia   . Hypertension   . Left bundle branch block   . Lymphedema   . Metastatic breast cancer ([place])    a. to bone.  . MI (myocardial infarction) ([place])   . Mild aortic stenosis 10/2017  . Morbid obesity ([place])   . PAF (paroxysmal atrial fibrillation) ([place])   . PSVT (paroxysmal supraventricular tachycardia) ([place])    a. per [facility] notes, seen on event monitor in 2014.  . Pulmonary nodules    Medications:  Warfarin 2 mg daily PTA  Assessment: 74 y/o F with a h/o , atrial fibrillation on warfarin, metastatic breast cancer, and chronic pain admitted to [facility] for palliative radiation therapy for hip pain. INR elevated on admission. Patient received vitamin K 2.5 mg po  7/30. Anticipate prolonged time to goal INR. Not clear why vitamin K was given with no bleeding, no plans for surgery, and INR of 4.   07/02/20 10:03 AM   INR 2.0  No bleeding reported  Goal of Therapy:  INR 2-3 Monitor platelets by anticoagulation protocol: Yes   Plan:  Warfarin 3 mg today at 1600 Daily PT/INR CBC ordered M,W,R  Minda Ditto PharmD 07/02/2020,10:03 AM

## 2020-07-02 NOTE — Progress Notes (Signed)
PROGRESS NOTE    Stacey [name]  ZOX:[id number] DOB: 1946/08/05 DOA: 06/26/2020 PCP: Patient, No Pcp Per  Chief Complaint  Patient presents with  . Hip Pain    Brief Narrative:  Per HPI: Stacey [name] 74 y.o.femalewith medical history significant formetastatic breast cancer, CAD, paroxysmal atrial fibrillation, COPD, and morbid obesity who presented to the ED with worsening left hip pain, despite taking her methadone 3 times daily and oxycodone 10 mg every 3 hours as needed for breakthrough pain. This has been ongoing for the last several days and she finds it very difficult to ambulate or perform any activities of daily living on account of her severe pain. She denies any new trauma, numbness/weakness, fevers or chills, chest pain, shortness of breath, nausea, vomiting, or diarrhea. She continues to follow-up at [facility] with her oncologist for ongoing cancer treatments and would like to be able to make it there for further discussion of ongoing treatment and also discussed with the palliative care team at that facility. Patient currently does not have Stacey [name] safe discharge plan as family cannot care for her at home and she appears to have lost her bed at [facility].  8/11:BJYNWGN continues to have significant pain that is exacerbated with any movement and is requiring high doses of oral pain medications as prescribed by palliative care. Discussed with patient and family regarding the need for radiation treatment and they are now agreeable to going to [facility] for further evaluation and management by radiation oncology.Discussed case with Dr. Tammi [name] who is agreeable to seeing her and will evaluate further at [facility] in Stacey [name].m.  7/30: Patient will transfer to [facility] today as no beds were available yesterday.  Noted to have some AKI for which gentle fluids will be started as well as monitoring of strict I's and O's and urine electrolytes.  Diuretics and nephrotoxic agents  held.  INR continues to remain elevated for which vitamin K will be given x1 dose.  Further evaluation per radiation oncology.  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 Tammi [name] from Radiation oncology yesterday - s/p CT simulation/treatment planning followed by single fraction of palliative radiotherapy - continued pain today,worse day today after active day yesterday, will continue to monitor after radiation therapy - will follow how she does with therapy (recommending HHPT)  AKI -Creatinine bumped to 1.1 noted 7/30 -slow downtrend, 0.94 today - continue IVF - follow - urine lytes suggest prerenal  -Hold nephrotoxic agents to include losartan, spironolactone, and torsemide -Monitor strict I's and O's, daily weights  History of paroxysmal atrial fibrillationwith supratherapeutic INR -Currently rate controlled -Continue metoprolol -Coumadin per pharmacy - subtherapeutic today after vitamin K,  continue to monitor -No signs of bleeding noted  History of systolic and diastolic CHF -Appears  euvolemic -Last 2D echocardiogram on 03/2020 with LVEF 35-40% with grade 1 diastolic dysfunction -Hold further diuretics on account of AKI -Monitor daily weights, strict I/O  History of hypertension-controlled -Continue current doses of losartan and metoprolol -Continue torsemide and spironolactone as noted above  History of CAD -Continue atorvastatin and Plavix as well as metoprolol  Obesity -Lifestyle changes  DVT prophylaxis: warfarin  Code Status: DNR Family Communication: sister Disposition:   Status is: Inpatient  Remains inpatient appropriate because:Inpatient level of care appropriate due to severity of illness   Dispo: The patient is from: Home              Anticipated d/c is to: SNF              Anticipated d/c date is: > 3 days              Patient currently is not medically stable to d/c.   Consultants:   Radiation oncology  Palliative care  Procedures:   none  Antimicrobials:  Anti-infectives (From admission, onward)   None      Subjective: Pain worse after sitting up in chair yesterday  Objective: Vitals:   07/01/20 2121 07/02/20 0556 07/02/20 1100 07/02/20 1434  BP: (!) 161/61 (!) 145/63 140/61 (!) 149/65  Pulse: 61 55 64 63  Resp: 17 17  20   Temp: (!) 97.5 F (36.4 C) (!) 97.5 F (36.4 C)  97.7 F (36.5 C)  TempSrc: Oral Oral  Oral  SpO2: 90% 100%  96%  Weight:      Height:        Intake/Output Summary (Last 24 hours) at 07/02/2020 1758 Last data filed at 07/02/2020 1150 Gross per 24 hour  Intake --  Output 1700 ml  Net -1700 ml   Filed Weights   06/27/20 1552 06/29/20 1225  Weight: (!) 92.5 kg 91.3 kg    Examination:  General: No acute distress. Cardiovascular: Heart sounds show Stacey [name] regular rate, and rhythm.  Lungs: Clear to auscultation bilaterally  Abdomen: Soft, nontender, nondistended  Neurological: Alert and oriented 3. Moves all extremities 4 . Cranial nerves II through XII grossly intact. Skin: Warm and dry. No  rashes or lesions. Extremities: TTP on L hip     Data Reviewed: I have personally reviewed following labs and imaging studies  CBC: Recent Labs  Lab 06/27/20 1422 06/29/20 0445 07/01/20 1222 07/02/20 0633  WBC 6.0 5.2 6.3 4.9  HGB 11.0* 11.4* 10.5* 10.2*  HCT 35.1* 36.4 33.9* 32.5*  MCV 101.2* 101.4* 102.4* 102.5*  PLT 217 271 249 366    Basic Metabolic Panel: Recent Labs  Lab 06/27/20 1422 06/29/20 0445 06/30/20 0623 07/01/20 1222 07/02/20 0633  NA 138 136 137 139 140  K 3.7 4.4 3.9 4.6 4.2  CL 102 96* 96* 98 100  CO2 27 29 32 34* 31  GLUCOSE 122* 114* 110* 110* 105*  BUN 14 29* 41* 33* 32*  CREATININE 0.62 1.11* 1.04* 0.91 0.94  CALCIUM 8.6* 8.8* 8.5* 8.4* 8.4*    GFR: Estimated Creatinine Clearance: 53.7 mL/min (by C-G formula based on SCr of 0.94 mg/dL).  Liver Function Tests: Recent Labs  Lab 07/02/20 0633  AST 20  ALT 11  ALKPHOS 42  BILITOT 0.7  PROT 6.2*  ALBUMIN 3.2*    CBG: No results for input(s): GLUCAP in the last 168 hours.   No results found for  this or any previous visit (from the past 240 hour(s)).       Radiology Studies: No results found.      Scheduled Meds: . acetaminophen  650 mg Oral Q8H  . atorvastatin  80 mg Oral Daily  . B-complex with vitamin C  1 tablet Oral q morning - 10a  . calcitonin (salmon)  1 spray Alternating Nares Daily  . calcium-vitamin D  1 tablet Oral Daily  . clopidogrel  75 mg Oral Daily  . dexamethasone  8 mg Oral Daily  . DULoxetine  90 mg Oral Daily  . letrozole  2.5 mg Oral QHS  . lidocaine  2 patch Transdermal Q24H  . methadone  5 mg Oral QID  . metoprolol succinate  100 mg Oral Daily  . oxyCODONE-acetaminophen  2 tablet Oral QHS  . pregabalin  150 mg Oral Daily  . senna  2 tablet Oral BID  . Warfarin - Pharmacist Dosing Inpatient   Does not apply q1600   Continuous Infusions:   LOS: 4 days    Time spent: over 30 min    Fayrene Helper, MD [facility]   To  contact the attending provider between 7A-7P or the covering provider during after hours 7P-7A, please log into the web site [email or web address] and access using universal [place] password for that web site. If you do not have the password, please call the hospital operator.  07/02/2020, 5:58 PM

## 2020-07-02 NOTE — Progress Notes (Signed)
Physical Therapy Treatment Patient Details Name: Stacey [name] MRN: [id number] DOB: [birth year] Today's Date: 07/02/2020    History of Present Illness 74 y.o. female with medical history significant for metastatic breast cancer, CAD, paroxysmal atrial fibrillation, COPD, and morbid obesity who presented to the ED with worsening left hip pain. CT=new sclerotic lesion to left pubic ramus    PT Comments    Pt agreeable to OOB, even though mobility exacerbates her pain.  Pt amb ~ 75' with RW and min/guard assist. Pt needed min/mod assist with bed mobility, she reports that her sister(s) can stay and assist as needed. Son helps with errands, meals, ADLs etc. Pt has all DME (declines transport chair), will need HHPT at d/c   Follow Up Recommendations  Home health PT;Supervision for mobility/OOB     Equipment Recommendations  None recommended by PT    Recommendations for Other Services       Precautions / Restrictions Precautions Precautions: Fall Restrictions Weight Bearing Restrictions: No    Mobility  Bed Mobility Overal bed mobility: Needs Assistance Bed Mobility: Supine to Sit;Sit to Supine     Supine to sit: Min assist;Mod assist Sit to supine: Min guard   General bed mobility comments: assist to elevate trunk from flat bed, pt instructed in use of gait belt as leg lifter to brign LEs on to bed, min/guard for safety   Transfers Overall transfer level: Needs assistance Equipment used: Rolling walker (2 wheeled) Transfers: Sit to/from Stand Sit to Stand: Min guard         General transfer comment: cues for safety and hand placement   Ambulation/Gait Ambulation/Gait assistance: Min guard;Min assist Gait Distance (Feet): 40 Feet Assistive device: Rolling walker (2 wheeled) Gait Pattern/deviations: Step-to pattern;Wide base of support Gait velocity: decr   General Gait Details: cues for RW position, slow gait mostly d/t pain, no LOB. fatigues easily   Stairs              Wheelchair Mobility    Modified Rankin (Stroke Patients Only)       Balance Overall balance assessment: Needs assistance Sitting-balance support: Feet supported;No upper extremity supported Sitting balance-Leahy Scale: Good     Standing balance support: No upper extremity supported Standing balance-Leahy Scale: Fair Standing balance comment: reliant on UEs for dynamic tasks                            Cognition Arousal/Alertness:  (sleepy likely d/t meds) Behavior During Therapy: WFL for tasks assessed/performed Overall Cognitive Status: Within Functional Limits for tasks assessed                                 General Comments: awake on arrival, falling asleep/closing during conversation end of session      Exercises      General Comments        Pertinent Vitals/Pain Pain Assessment: Faces Faces Pain Scale: Hurts worst Pain Location: L hip and lower back  Pain Descriptors / Indicators: Grimacing;Sharp Pain Intervention(s): Limited activity within patient's tolerance;Monitored during session;Premedicated before session    Home Living                      Prior Function            PT Goals (current goals can now be found in the care plan section) Acute Rehab PT Goals  Patient Stated Goal: home when ready PT Goal Formulation: With patient/family Time For Goal Achievement: 07/06/20 Potential to Achieve Goals: Fair Progress towards PT goals: Progressing toward goals    Frequency    Min 3X/week      PT Plan Current plan remains appropriate    Co-evaluation              AM-PAC PT "6 Clicks" Mobility   Outcome Measure  Help needed turning from your back to your side while in a flat bed without using bedrails?: A Little Help needed moving from lying on your back to sitting on the side of a flat bed without using bedrails?: A Lot Help needed moving to and from a bed to a chair (including a  wheelchair)?: A Little Help needed standing up from a chair using your arms (e.g., wheelchair or bedside chair)?: A Little Help needed to walk in hospital room?: A Little Help needed climbing 3-5 steps with a railing? : A Little 6 Click Score: 17    End of Session Equipment Utilized During Treatment: Gait belt Activity Tolerance: Patient tolerated treatment well;Patient limited by pain Patient left: in bed;with call bell/phone within reach (alarm not set on arrival, left same )   PT Visit Diagnosis: Other abnormalities of gait and mobility (R26.89);Muscle weakness (generalized) (M62.81);Pain Pain - Right/Left: Left Pain - part of body: Hip     Time: 9735-3299 PT Time Calculation (min) (ACUTE ONLY): 25 min  Charges:  $Gait Training: 8-22 mins $Therapeutic Activity: 8-22 mins                     Baxter Flattery, PT  Acute Rehab Dept ([place]) [phone] Pager [phone]  07/02/2020    [facility] 07/02/2020, 2:16 PM

## 2020-07-03 ENCOUNTER — Telehealth: Payer: Self-pay | Admitting: Orthopaedic Surgery

## 2020-07-03 LAB — CBC
HCT: 34.2 % — ABNORMAL LOW (ref 36.0–46.0)
Hemoglobin: 10.7 g/dL — ABNORMAL LOW (ref 12.0–15.0)
MCH: 32.8 pg (ref 26.0–34.0)
MCHC: 31.3 g/dL (ref 30.0–36.0)
MCV: 104.9 fL — ABNORMAL HIGH (ref 80.0–100.0)
Platelets: 256 10*3/uL (ref 150–400)
RBC: 3.26 MIL/uL — ABNORMAL LOW (ref 3.87–5.11)
RDW: 14.5 % (ref 11.5–15.5)
WBC: 5.2 10*3/uL (ref 4.0–10.5)
nRBC: 0 % (ref 0.0–0.2)

## 2020-07-03 LAB — PROTIME-INR
INR: 3.2 — ABNORMAL HIGH (ref 0.8–1.2)
Prothrombin Time: 31.9 seconds — ABNORMAL HIGH (ref 11.4–15.2)

## 2020-07-03 LAB — COMPREHENSIVE METABOLIC PANEL
ALT: 13 U/L (ref 0–44)
AST: 19 U/L (ref 15–41)
Albumin: 3.3 g/dL — ABNORMAL LOW (ref 3.5–5.0)
Alkaline Phosphatase: 44 U/L (ref 38–126)
Anion gap: 7 (ref 5–15)
BUN: 30 mg/dL — ABNORMAL HIGH (ref 8–23)
CO2: 32 mmol/L (ref 22–32)
Calcium: 8.4 mg/dL — ABNORMAL LOW (ref 8.9–10.3)
Chloride: 101 mmol/L (ref 98–111)
Creatinine, Ser: 1.06 mg/dL — ABNORMAL HIGH (ref 0.44–1.00)
GFR calc Af Amer: >60 mL/min (ref >60)
GFR calc non Af Amer: 52 mL/min — ABNORMAL LOW (ref >60)
Glucose, Bld: 134 mg/dL — ABNORMAL HIGH (ref 70–99)
Potassium: 4.3 mmol/L (ref 3.5–5.1)
Sodium: 140 mmol/L (ref 135–145)
Total Bilirubin: 0.7 mg/dL (ref 0.3–1.2)
Total Protein: 6.1 g/dL — ABNORMAL LOW (ref 6.5–8.1)

## 2020-07-03 LAB — MAGNESIUM: Magnesium: 2.5 mg/dL — ABNORMAL HIGH (ref 1.7–2.4)

## 2020-07-03 LAB — PHOSPHORUS: Phosphorus: 2.1 mg/dL — ABNORMAL LOW (ref 2.5–4.6)

## 2020-07-03 MED ORDER — WARFARIN 0.5 MG HALF TABLET
0.5000 mg | ORAL_TABLET | Freq: Once | ORAL | Status: AC
  Administered 2020-07-03: 0.5 mg via ORAL
  Filled 2020-07-03: qty 1

## 2020-07-03 NOTE — Telephone Encounter (Signed)
Per voice message, patient called requesting order for hospital bed. [facility] still has no primary care provider in [place] since Dr Luan Pulling retired. Please advise. (patient asked for [facility])

## 2020-07-03 NOTE — TOC Transition Note (Addendum)
Transition of Care [facility]) - CM/SW Discharge Note   Patient Details  Name: Stacey [name] MRN: [id number] Date of Birth: [birth year]  [facility] CM/SW Contact:  Merrit Waugh, Marjie Skiff, RN Phone Number: 07/03/2020, 1:39 PM   Clinical Narrative:      Addendum:  [facility] now unable to provide services for pt. [facility] was pt second choice. [facility] liaison contacted for referral  This CM met with pt at bedside for dc planning. Per previous TOC notes pt has used up her Medicare SNF bed days and is unable to pay for co-pay days. Pt walked 56f min assist with physical therapy on 8/2. Home Health was recommended. This CM offered pt [facility]. This CM also contacted [facility] liaison for hospital bed.  Final next level of care: HLoloBarriers to Discharge: No Barriers Identified   Patient Goals and CMS Choice Patient states their goals for this hospitalization and ongoing recovery are:: To get better CMS Medicare.gov Compare Post Acute Care list provided to:: Patient Choice offered to / list presented to : Patient    Discharge Plan and Services   Discharge Planning Services: CM Consult Post Acute Care Choice: Home Health          DME Arranged: Hospital bed DME Agency: [facility] Date DME Agency Contacted: 07/03/20   Representative spoke with at DME Agency: [place] RN, PT, OT, Nurse's Aide, Social Work HCSX CorporationAgency: [place] Date [place] 07/03/20 Time HSheridan 1200 Representative spoke with at HSunshine [place] Interventions     Readmission Risk Interventions Readmission Risk Prevention Plan 07/03/2020 04/26/2020  Transportation Screening Complete Complete  HRI or [place]- Complete  Social Work Consult for RGlenbrookPlanning/Counseling - Complete   Palliative Care Screening - Complete  Medication Review (Press photographer Complete Complete  PCP or Specialist appointment within 3-5 days of discharge Complete -  HLa Prairieor HPort [place]-IrondaleComplete -  SW Recovery Care/Counseling Consult Complete -  Palliative Care Screening Complete -  SParcelas de NavarroNot Applicable -

## 2020-07-03 NOTE — Progress Notes (Signed)
ANTICOAGULATION CONSULT NOTE - Initial Consult  Pharmacy Consult for warfarin Indication: atrial fibrillation  Allergies  Allergen Reactions  . Brilinta [Ticagrelor] Other (See Comments)    Nausea and severe diarrhea- general weakness. Patient says does not want to take again  . Albuterol Other (See Comments)    Heart racing    Patient Measurements: Height: 5' (152.4 cm) Weight: 91.3 kg (201 lb 3.2 oz) IBW/kg (Calculated) : 45.5  Vital Signs:    Labs: Recent Labs    07/01/20 0733 07/01/20 1222 07/01/20 1222 07/02/20 0633 07/03/20 0608  HGB  --  10.5*   < > 10.2* 10.7*  HCT  --  33.9*  --  32.5* 34.2*  PLT  --  249  --  247 256  LABPROT 17.4*  --   --  22.3* 31.9*  INR 1.5*  --   --  2.0* 3.2*  CREATININE  --  0.91  --  0.94 1.06*   < > = values in this interval not displayed.   Estimated Creatinine Clearance: 47.6 mL/min (A) (by C-G formula based on SCr of 1.06 mg/dL (H)).  Medical History: Past Medical History:  Diagnosis Date  . CAD in native artery    a. DES to ramus 2005 with late stent thrombosis 2006 tx with PTCA. 12/18 PCI/DES x1 to mRCA, EF 50-55%  . CHF (congestive heart failure) ([place])   . Chronic pain   . COPD (chronic obstructive pulmonary disease) ([place])   . Hyperlipidemia   . Hypertension   . Left bundle branch block   . Lymphedema   . Metastatic breast cancer ([place])    a. to bone.  . MI (myocardial infarction) ([place])   . Mild aortic stenosis 10/2017  . Morbid obesity ([place])   . PAF (paroxysmal atrial fibrillation) ([place])   . PSVT (paroxysmal supraventricular tachycardia) ([place])    a. per [facility] notes, seen on event monitor in 2014.  . Pulmonary nodules    Medications:  Warfarin 2 mg daily PTA  Assessment: 74 y/o F with a h/o , atrial fibrillation on warfarin, metastatic breast cancer, and chronic pain admitted to [facility] for palliative radiation therapy for hip pain. INR elevated on admission. Patient received vitamin K 2.5 mg po 7/30. Anticipate  prolonged time to goal INR. Not clear why vitamin K was given with no bleeding, no plans for surgery, and INR of 4.   07/03/20 10:05 AM   INR 3.2  Hgb low,stable, Plt wnl  No bleeding reported  Goal of Therapy:  INR 2-3 Monitor platelets by anticoagulation protocol: Yes   Plan:  Warfarin 0.5mg  today at 1600, will give very small dose instead of holding today,     to avoid INR drop Daily PT/INR CBC ordered M,W,F  Minda Ditto PharmD 07/03/2020,10:05 AM

## 2020-07-03 NOTE — Progress Notes (Signed)
PROGRESS NOTE    Stacey [name]  WRU:[id number] DOB: 1946-08-07 DOA: 06/26/2020 PCP: Patient, No Pcp Per  Chief Complaint  Patient presents with  . Hip Pain    Brief Narrative:  Per HPI: Stacey [name] 74 y.o.femalewith medical history significant formetastatic breast cancer, CAD, paroxysmal atrial fibrillation, COPD, and morbid obesity who presented to the ED with worsening left hip pain, despite taking her methadone 3 times daily and oxycodone 10 mg every 3 hours as needed for breakthrough pain. This has been ongoing for the last several days and she finds it very difficult to ambulate or perform any activities of daily living on account of her severe pain. She denies any new trauma, numbness/weakness, fevers or chills, chest pain, shortness of breath, nausea, vomiting, or diarrhea. She continues to follow-up at [facility] with her oncologist for ongoing cancer treatments and would like to be able to make it there for further discussion of ongoing treatment and also discussed with the palliative care team at that facility. Patient currently does not have Aliceson Dolbow safe discharge plan as family cannot care for her at home and she appears to have lost her bed at [facility].  She was admitted for L hip pain which was thought to be 2/2 sclerotic metastasis at L inferior pubic ramus.  She's now had radiation on 7/30 and her pain seems to be gradually improving.  Hopefully we're able to discharge within next 24-48 hrs.   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 Tammi Klippel from Radiation oncology 7/30 - s/p CT simulation/treatment planning followed by single fraction of palliative radiotherapy - continued pain today, not much improved today, will continue to monitor after radiation therapy - will follow how she does with therapy (recommending HHPT)  AKI -Creatinine bumped to 1.1 noted 7/30 -slow downtrend, 0.94 today - continue IVF - follow - urine lytes suggest prerenal  -Hold nephrotoxic agents to include losartan, spironolactone, and torsemide -Monitor strict I's and O's, daily weights  History of paroxysmal atrial fibrillationwith supratherapeutic INR -Currently rate controlled -Continue metoprolol -Coumadin per pharmacy - subtherapeutic today after vitamin K, continue to monitor -No signs of bleeding noted  History of systolic and diastolic CHF -Appears euvolemic -Last 2D echocardiogram on 03/2020 with LVEF 35-40% with grade 1 diastolic dysfunction -Hold further diuretics on account of AKI -Monitor daily weights, strict I/O  History of hypertension-controlled -Continue current doses of losartan and metoprolol -Continue torsemide and spironolactone as noted above  History of CAD -Continue atorvastatin and Plavix as well as metoprolol  Obesity -Lifestyle changes  DVT prophylaxis: warfarin  Code Status: DNR Family Communication: sister Disposition:   Status is: Inpatient  Remains inpatient appropriate because:Inpatient level of care appropriate  due to severity of illness   Dispo: The patient is from: Home              Anticipated d/c is to: SNF              Anticipated d/c date is: > 3 days              Patient currently is not medically stable to d/c.   Consultants:   Radiation oncology  Palliative care  Procedures:   none  Antimicrobials:  Anti-infectives (From admission, onward)   None      Subjective: C/o persistent pain, she's going home independently, doesn't feel comfortable d/c home  Objective: Vitals:   07/02/20 1434 07/02/20 2046 07/03/20 1034 07/03/20 1401  BP: (!) 149/65 (!) 166/67 (!) 164/74 (!) 154/79  Pulse: 63 64 67 77  Resp: 20 16  18   Temp: 97.7 F (36.5 C) 98.1 F (36.7 C)  98 F (36.7 C)  TempSrc: Oral Oral  Oral  SpO2: 96% 90%  92%  Weight:      Height:        Intake/Output Summary (Last 24 hours) at 07/03/2020 1918 Last data filed at 07/03/2020 0900 Gross per 24 hour  Intake --  Output 1000 ml  Net -1000 ml   Filed Weights   06/27/20 1552 06/29/20 1225  Weight: (!) 92.5 kg 91.3 kg    Examination:  General: No acute distress. Cardiovascular: Heart sounds show Adanely Reynoso regular rate, and rhythm Lungs: Clear to auscultation bilaterally Abdomen: Soft, nontender, nondistended  Neurological: Alert and oriented 3. Moves all extremities 4 . Cranial nerves II through XII grossly intact. Skin: Warm and dry. No rashes or lesions. Extremities: L hip TTP       Data Reviewed: I have personally reviewed following labs and imaging studies  CBC: Recent Labs  Lab 06/27/20 1422 06/29/20 0445 07/01/20 1222 07/02/20 0633 07/03/20 0608  WBC 6.0 5.2 6.3 4.9 5.2  HGB 11.0* 11.4* 10.5* 10.2* 10.7*  HCT 35.1* 36.4 33.9* 32.5* 34.2*  MCV 101.2* 101.4* 102.4* 102.5* 104.9*  PLT 217 271 249 247 841    Basic Metabolic Panel: Recent Labs  Lab 06/29/20 0445 06/30/20 0623 07/01/20 1222 07/02/20 0633 07/03/20 0608  NA 136 137 139 140 140  K 4.4 3.9 4.6 4.2 4.3  CL 96* 96* 98 100 101   CO2 29 32 34* 31 32  GLUCOSE 114* 110* 110* 105* 134*  BUN 29* 41* 33* 32* 30*  CREATININE 1.11* 1.04* 0.91 0.94 1.06*  CALCIUM 8.8* 8.5* 8.4* 8.4* 8.4*  MG  --   --   --   --  2.5*  PHOS  --   --   --   --  2.1*    GFR: Estimated Creatinine Clearance: 47.6 mL/min (Orlinda Slomski) (by C-G formula based on SCr of 1.06 mg/dL (H)).  Liver Function Tests: Recent Labs  Lab 07/02/20 0633 07/03/20 0608  AST 20 19  ALT 11 13  ALKPHOS 42 44  BILITOT 0.7 0.7  PROT 6.2* 6.1*  ALBUMIN 3.2* 3.3*    CBG: No results for input(s): GLUCAP in the last 168 hours.   No results found for this or any previous visit (from the past 240 hour(s)).       Radiology Studies: No results found.      Scheduled Meds: . acetaminophen  650 mg Oral Q8H  . atorvastatin  80 mg Oral Daily  .  B-complex with vitamin C  1 tablet Oral q morning - 10a  . calcitonin (salmon)  1 spray Alternating Nares Daily  . calcium-vitamin D  1 tablet Oral Daily  . clopidogrel  75 mg Oral Daily  . dexamethasone  8 mg Oral Daily  . DULoxetine  90 mg Oral Daily  . letrozole  2.5 mg Oral QHS  . lidocaine  2 patch Transdermal Q24H  . methadone  5 mg Oral QID  . metoprolol succinate  100 mg Oral Daily  . oxyCODONE-acetaminophen  2 tablet Oral QHS  . pregabalin  150 mg Oral Daily  . senna  2 tablet Oral BID  . Warfarin - Pharmacist Dosing Inpatient   Does not apply q1600   Continuous Infusions:   LOS: 5 days    Time spent: over 30 min    Fayrene Helper, MD [facility]   To contact the attending provider between 7A-7P or the covering provider during after hours 7P-7A, please log into the web site [email or web address] and access using universal [place] password for that web site. If you do not have the password, please call the hospital operator.  07/03/2020, 7:18 PM

## 2020-07-03 NOTE — Progress Notes (Addendum)
    Durable Medical Equipment  (From admission, onward)         Start     Ordered   07/03/20 1324  For home use only DME Hospital bed  Once       Question Answer Comment  Length of Need Lifetime   Patient has (list medical condition): metastatic breast cancer   The above medical condition requires: Patient requires the ability to reposition frequently   Head must be elevated greater than: 30 degrees   Bed type Semi-electric      07/03/20 1324

## 2020-07-04 DIAGNOSIS — I5042 Chronic combined systolic (congestive) and diastolic (congestive) heart failure: Secondary | ICD-10-CM

## 2020-07-04 DIAGNOSIS — R7989 Other specified abnormal findings of blood chemistry: Secondary | ICD-10-CM

## 2020-07-04 DIAGNOSIS — R791 Abnormal coagulation profile: Secondary | ICD-10-CM

## 2020-07-04 DIAGNOSIS — I480 Paroxysmal atrial fibrillation: Secondary | ICD-10-CM

## 2020-07-04 LAB — CBC
HCT: 35.3 % — ABNORMAL LOW (ref 36.0–46.0)
Hemoglobin: 10.9 g/dL — ABNORMAL LOW (ref 12.0–15.0)
MCH: 32.4 pg (ref 26.0–34.0)
MCHC: 30.9 g/dL (ref 30.0–36.0)
MCV: 105.1 fL — ABNORMAL HIGH (ref 80.0–100.0)
Platelets: 239 10*3/uL (ref 150–400)
RBC: 3.36 MIL/uL — ABNORMAL LOW (ref 3.87–5.11)
RDW: 14.6 % (ref 11.5–15.5)
WBC: 6 10*3/uL (ref 4.0–10.5)
nRBC: 0 % (ref 0.0–0.2)

## 2020-07-04 LAB — PROTIME-INR
INR: 5 (ref 0.8–1.2)
Prothrombin Time: 44.9 seconds — ABNORMAL HIGH (ref 11.4–15.2)

## 2020-07-04 NOTE — Progress Notes (Signed)
ANTICOAGULATION CONSULT NOTE - Follow Up Consult  Pharmacy Consult for warfarin Indication: hx atrial fibrillation  Allergies  Allergen Reactions  . Brilinta [Ticagrelor] Other (See Comments)    Nausea and severe diarrhea- general weakness. Patient says does not want to take again  . Albuterol Other (See Comments)    Heart racing     Patient Measurements: Height: 5' (152.4 cm) Weight: 91.3 kg (201 lb 3.2 oz) IBW/kg (Calculated) : 45.5 Heparin Dosing Weight:   Vital Signs: Temp: 97.9 F (36.6 C) (08/04 0604) Temp Source: Oral (08/04 0604) BP: 155/71 (08/04 0604) Pulse Rate: 67 (08/04 0604)  Labs: Recent Labs    07/01/20 1222 07/01/20 1222 07/02/20 8832 07/02/20 5498 07/03/20 0608 07/04/20 0613  HGB 10.5*   < > 10.2*   < > 10.7* 10.9*  HCT 33.9*   < > 32.5*  --  34.2* 35.3*  PLT 249   < > 247  --  256 239  LABPROT  --   --  22.3*  --  31.9* 44.9*  INR  --   --  2.0*  --  3.2* 5.0*  CREATININE 0.91  --  0.94  --  1.06*  --    < > = values in this interval not displayed.    Estimated Creatinine Clearance: 47.6 mL/min (A) (by C-G formula based on SCr of 1.06 mg/dL (H)).   Medications:  PTA warfarin regimen: 3 mg daily  Assessment: Patient is a 74 y.o F with metastatic breast cancer, chronic pain and hx afib on warfarin PTA, presented to the ED on 7/27 with c/o hip pain.  Pharmacy is consulted to manage pt's warfarin inpatient.  - 7/30: vit K 2.5mg  PO x1   Today, 07/04/2020: - INR is supra-therapeutic at 5 today - cbc stable - no bleeding documented - on heart healthy diet - no significant drug-drug intxns  Goal of Therapy:  INR 2-3 Monitor platelets by anticoagulation protocol: Yes   Plan:  - hold warfarin today - f/u INR and resume if/when appropriate - monitor for s/sx bleeding - daily INR  Crystalyn Delia P 07/04/2020,8:36 AM

## 2020-07-04 NOTE — Progress Notes (Signed)
PROGRESS NOTE    Stacey [name]  HUT:[id number] DOB: [birth year] DOA: 06/26/2020 PCP: Patient, No Pcp Per    Chief Complaint  Patient presents with  . Hip Pain    Brief Narrative:  Per HPI: Stacey B Swaneyis a 74 y.o.femalewith medical history significant formetastatic breast cancer, CAD, paroxysmal atrial fibrillation, COPD, and morbid obesity who presented to the ED with worsening left hip pain, despite taking her methadone 3 times daily and oxycodone 10 mg every 3 hours as needed for breakthrough pain. This has been ongoing for the last several days and she finds it very difficult to ambulate or perform any activities of daily living on account of her severe pain. She denies any new trauma, numbness/weakness, fevers or chills, chest pain, shortness of breath, nausea, vomiting, or diarrhea. She continues to follow-up at [facility] with her oncologist for ongoing cancer treatments and would like to be able to make it there for further discussion of ongoing treatment and also discussed with the palliative care team at that facility.   She recently presented to the [facility] ED on 06/24/2020 with new, worsening left hip pain. Bilateral hip x-ray performed at that time showed a probable sclerotic metastasis at the left inferior pubic ramus. She was discharged with additional pain medication. However, she returned on 06/26/2020 with increasing pain that limits her ambulation and ability to perform her daily activities, poorly controlled with oral pain medications.  She was initially hoping to get back in with her radiation oncologist over at [facility] but they were unable to see her until sometime next week.  Because her pain has been poorly controlled with oral pain medications, she requested to be transferred to [facility] for consideration of palliative radiotherapy sooner.  Therefore, we have been asked to consult for consideration of palliative radiotherapy to help control her left hip  pain.    She's now had radiation on 7/30 and her pain seems to be gradually improving  Subjective:  She is feeling better, though still need assistance getting out of bed, she is concerned about her ability to be able to stay at night by herself She requests PT reeval Family at bedside  Assessment & Plan:   Active Problems:   Pain of metastatic malignancy   Malignant neoplasm metastatic to bone [facility])   Malignant neoplasm of right breast in female, estrogen receptor positive ([place])   Goals of care, counseling/discussion   Advanced care planning/counseling discussion   Left hip pain   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 -s/p XRT on 7/30 Cancer pain control per palliative care recommendation: ? Methadone 5mg  every 6 hours po ? Acetaminophen 650mg  every 8 hours po ? Lidocaine patches to both hips- leave on for 12 hrs, remove for 12 hours, repeat daily ? Dexamethasone 8mg  po daily- recommend continuing until she follows up with her outpatient Palliative provider to allow time for delayed full benefit of radiation treatment ? Oxycodone 10mg  every 3 hours by mouth AS NEEDED FOR BREAKTHROUGH PAIN ? Oxycodone-Acetaminophen 5-325- TWO TABLETS AT BEDTIME every night by mouth  ? Cymbalta 90 mg po daily ? Pregabalin 150mg  once daily by mouth ? Clonazepam 1mg  three times daily AS NEEDED for anxiety or sleep ? Continue Senna 2 PO BID by mouth for bowel prophylaxis   AKI on CKDII /azotemia/anemia of chronic disease --Azotemia improving, though not normal yet -Creatinine fluctuating -Hemoglobin stable  at baseline -Continue hold diuretics, encourage oral intake -Repeat BMP in the morning  paroxysmal atrial fibrillationwith supratherapeutic INR -Currently rate controlled -Continue metoprolol, INR fluctuation from subtherapeutic to supratherapeutic -today INR is 5, discussed with  pharmacy , currently holding coumadin, repeat INR in am, no sign of bleeding  CAD -Continue atorvastatin and Plavix as well as metoprolol -no cheat pain   Chronic combined systolic and diastolic CHF -Appears euvolemic -Last 2D echocardiogram on 03/2020 with LVEF 35-40% with grade 1 diastolic dysfunction -Hold further diuretics on account of AKI -Monitor daily weights, strict I/O   FTT; arrange home health and DME  Body mass index is 39.29 kg/m.   DVT prophylaxis: Currently supratherapeutic INR   Code Status: DNR Family Communication: Family at bedside Disposition:   Status is: Inpatient     Dispo: The patient is from: Home              Anticipated d/c is to: Home with home health              Anticipated d/c date is: Likely tomorrow             If INR going down, BUN and creatinine continue to improve  Consultants:   Radiation oncology  Palliative care  Procedures:   Radiation treatmentx1  Antimicrobials:    None    Objective: Vitals:   07/03/20 1401 07/03/20 2144 07/04/20 0604 07/04/20 1106  BP: (!) 154/79 (!) 147/64 (!) 155/71 (!) 168/70  Pulse: 77 69 67 69  Resp: 18 17 15    Temp: 98 F (36.7 C) 97.7 F (36.5 C) 97.9 F (36.6 C)   TempSrc: Oral Oral Oral   SpO2: 92% 94% 99% 99%  Weight:      Height:        Intake/Output Summary (Last 24 hours) at 07/04/2020 1353 Last data filed at 07/04/2020 0900 Gross per 24 hour  Intake 360 ml  Output 1475 ml  Net -1115 ml   Filed Weights   06/27/20 1552 06/29/20 1225  Weight: (!) 92.5 kg 91.3 kg    Examination:  General exam: calm, NAD Respiratory system: Clear to auscultation. Respiratory effort normal. Cardiovascular system: S1 & S2 heard, RRR. No JVD, no murmur, No pedal edema. Gastrointestinal system: Abdomen is nondistended, soft and nontender. No organomegaly or masses felt. Normal bowel sounds heard. Central nervous system: Alert and oriented. No focal neurological deficits. Extremities:  Generalized weakness, no edema. Skin: No rashes, lesions or ulcers Psychiatry: Judgement and insight appear normal. Mood & affect appropriate.     Data Reviewed: I have personally reviewed following labs and imaging studies  CBC: Recent Labs  Lab 06/29/20 0445 07/01/20 1222 07/02/20 0633 07/03/20 0608 07/04/20 0613  WBC 5.2 6.3 4.9 5.2 6.0  HGB 11.4* 10.5* 10.2* 10.7* 10.9*  HCT 36.4 33.9* 32.5* 34.2* 35.3*  MCV 101.4* 102.4* 102.5* 104.9* 105.1*  PLT 271 249 247 256 263    Basic Metabolic Panel: Recent Labs  Lab 06/29/20 0445 06/30/20 0623 07/01/20 1222 07/02/20 0633 07/03/20 0608  NA 136 137 139 140 140  K 4.4 3.9 4.6 4.2 4.3  CL 96* 96* 98 100 101  CO2 29 32 34* 31 32  GLUCOSE 114* 110* 110* 105* 134*  BUN 29* 41* 33* 32* 30*  CREATININE 1.11* 1.04* 0.91 0.94 1.06*  CALCIUM 8.8* 8.5* 8.4* 8.4* 8.4*  MG  --   --   --   --  2.5*  PHOS  --   --   --   --  2.1*    GFR: Estimated Creatinine Clearance: 47.6 mL/min (A) (by C-G formula based on SCr of 1.06 mg/dL (H)).  Liver Function Tests: Recent Labs  Lab 07/02/20 0633 07/03/20 0608  AST 20 19  ALT 11 13  ALKPHOS 42 44  BILITOT 0.7 0.7  PROT 6.2* 6.1*  ALBUMIN 3.2* 3.3*    CBG: No results for input(s): GLUCAP in the last 168 hours.   No results found for this or any previous visit (from the past 240 hour(s)).       Radiology Studies: No results found.      Scheduled Meds: . acetaminophen  650 mg Oral Q8H  . atorvastatin  80 mg Oral Daily  . B-complex with vitamin C  1 tablet Oral q morning - 10a  . calcitonin (salmon)  1 spray Alternating Nares Daily  . calcium-vitamin D  1 tablet Oral Daily  . clopidogrel  75 mg Oral Daily  . dexamethasone  8 mg Oral Daily  . DULoxetine  90 mg Oral Daily  . letrozole  2.5 mg Oral QHS  . lidocaine  2 patch Transdermal Q24H  . methadone  5 mg Oral QID  . metoprolol succinate  100 mg Oral Daily  . oxyCODONE-acetaminophen  2 tablet Oral QHS  .  pregabalin  150 mg Oral Daily  . senna  2 tablet Oral BID  . Warfarin - Pharmacist Dosing Inpatient   Does not apply q1600   Continuous Infusions:   LOS: 6 days   Time spent: 30mins Greater than 50% of this time was spent in counseling, explanation of diagnosis, planning of further management, and coordination of care.  I have personally reviewed and interpreted on  07/04/2020 daily labs,  imagings as discussed above under date review session and assessment and plans.  I reviewed all nursing notes, pharmacy notes, consultant notes,  vitals, pertinent old records  I have discussed plan of care as described above with RN , patient and family on 07/04/2020  Voice Recognition /Dragon dictation system was used to create this note, attempts have been made to correct errors. Please contact the author with questions and/or clarifications.   Florencia Reasons, MD PhD FACP [facility]  Available via Epic secure chat 7am-7pm for nonurgent issues Please page for urgent issues To page the attending provider between 7A-7P or the covering provider during after hours 7P-7A, please log into the web site [email or web address] and access using universal [place] password for that web site. If you do not have the password, please call the hospital operator.    07/04/2020, 1:53 PM

## 2020-07-04 NOTE — Progress Notes (Addendum)
Daily Progress Note   Patient Name: Stacey [name]       Date: 07/04/2020 DOB: 1946/08/24  Age: 74 y.o. MRN#: [id number] Attending Physician: Florencia Reasons, MD Primary Care Physician: Patient, No Pcp Per Admit Date: 06/26/2020  Reason for Consultation/Follow-up: Establishing goals of care and Pain control  Subjective: Patient walking in hall with PT on my arrival. Per PT she was able to get out of bed independently.  Discussed disposition plan with [name] and her sister. [name] feels comfortable going to her home. Both sisters are at bedside and they agree, they live close by and will be checking on her frequently. We discussed the need to plan for the future if she becomes disabled and cannot live at home alone. She says they have discussed it and the plan is for her to eventually move in with her son, [name].   Length of Stay: 6  Current Medications: Scheduled Meds:   acetaminophen  650 mg Oral Q8H   atorvastatin  80 mg Oral Daily   B-complex with vitamin C  1 tablet Oral q morning - 10a   calcitonin (salmon)  1 spray Alternating Nares Daily   calcium-vitamin D  1 tablet Oral Daily   clopidogrel  75 mg Oral Daily   dexamethasone  8 mg Oral Daily   DULoxetine  90 mg Oral Daily   letrozole  2.5 mg Oral QHS   lidocaine  2 patch Transdermal Q24H   methadone  5 mg Oral QID   metoprolol succinate  100 mg Oral Daily   oxyCODONE-acetaminophen  2 tablet Oral QHS   pregabalin  150 mg Oral Daily   senna  2 tablet Oral BID   Warfarin - Pharmacist Dosing Inpatient   Does not apply q1600    Continuous Infusions:   PRN Meds: albuterol, clonazePAM, ondansetron **OR** ondansetron (ZOFRAN) IV, oxyCODONE           Vital Signs: BP (!) 168/70 Comment: medicated with BP meds   Pulse 69     Temp 97.9 F (36.6 C) (Oral)    Resp 15    Ht 5' (1.524 m)    Wt 91.3 kg    SpO2 99%    BMI 39.29 kg/m  SpO2: SpO2: 99 % O2 Device: O2 Device: Room Air O2 Flow Rate:  Intake/output summary:   Intake/Output Summary (Last 24 hours) at 07/04/2020 1434 Last data filed at 07/04/2020 0900 Gross per 24 hour  Intake 360 ml  Output 1475 ml  Net -1115 ml   LBM: Last BM Date: 07/03/20 (per pt) Baseline Weight: Weight: (!) 92.5 kg Most recent weight: Weight: 91.3 kg       Palliative Assessment/Data: PPS: 60%     Patient Active Problem List   Diagnosis Date Noted   Metastasis to bone of unknown primary ([place]) 06/28/2020   Left hip pain    Pain of metastatic malignancy 06/27/2020   Malignant neoplasm metastatic to bone [facility])    Malignant neoplasm of right breast in female, estrogen receptor positive ([place])    Goals of care, counseling/discussion    Advanced care planning/counseling discussion    Spinal stenosis of lumbar region    Bone metastases ([place])    Palliative care by specialist    Lumbar pain 04/24/2020   Lumbar radiculopathy 02/16/2020   Palpitations 11/16/2019   Dysuria    Acute bilateral low back pain 10/02/2019   Bipolar 2 disorder, major depressive episode ([place]) 09/01/2019   Adjustment disorder with depressed mood 06/30/2019   Encounter for therapeutic drug monitoring 12/28/2018   Atrial fibrillation with rapid ventricular response ([place]) 11/14/2018   Fall    Obesity, Class III, BMI 40-49.9 (morbid obesity) ([place])    Chronic respiratory failure with hypoxia ([facility])    Elevated troponin 10/07/2018   PSVT (paroxysmal supraventricular tachycardia) ([place]) 10/07/2018   Hyperlipidemia 10/07/2018   COPD (chronic obstructive pulmonary disease) ([place]) 10/07/2018   CAD in native artery 10/07/2018   Chronic pain 10/07/2018   Acute back pain 10/07/2018   Acute hypoxemic respiratory failure ([place]) 09/19/2018   Acute on chronic combined systolic and  diastolic CHF (congestive heart failure) ([place]) 09/19/2018   COPD with acute exacerbation ([place]) 09/19/2018   Dyspnea 11/30/2017   Non-ST elevation (NSTEMI) myocardial infarction ([place])    Hypertension 11/23/2017   Left bundle branch block 11/23/2017   Acute on chronic diastolic CHF (congestive heart failure) ([place]) 11/23/2017   CAD (coronary artery disease) 11/23/2017   Metastatic breast cancer ([place]) 11/23/2017   Acute respiratory failure with hypoxia ([place]) 11/23/2017   Atypical nevus 07/29/2016   Age-related nuclear cataract, bilateral 02/12/2016   Anxiety and depression 01/24/2016   Arthropathy, lower leg 09/14/2013   Tibialis tendinitis 02/22/2013   Plantar fasciitis 01/17/2013    Palliative Care Assessment & Plan   Patient Profile: 74 y.o.femalewith past medical history of chronic back pain, CHF, CAD, COPD,renal mass 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 Recent admission and discharge to SNF now presenting to ED for disabling pain in the L hip/groin area. Noted xray on 7/25 showing sclerotic lesion on "L interior pubic ramus, corresponding to a sclerotic region on CT likely a sclerotic metastasis". Palliative medicine consulted for symptom management and goals of care.  Assessment/Recommendations/Plan   [name] has reached her goal of being able to manage pain and be more functional with ability to return home  Recommend continuing current pain regimen:   Methadone 5mg  every 6 hours po  Acetaminophen 650mg  every 8 hours po  Lidocaine patches to both hips- leave on for 12 hrs, remove for 12 hours, repeat daily  Dexamethasone 8mg  po daily- recommend continuing until she follows up with her outpatient Palliative provider to allow time for delayed full benefit of  radiation treatment  Oxycodone 10mg  every 3 hours by mouth AS NEEDED FOR  BREAKTHROUGH PAIN  Oxycodone-Acetaminophen 5-325- TWO TABLETS AT BEDTIME every night by mouth   Cymbalta 90 mg po daily  Pregabalin 150mg  once daily by mouth  Clonazepam 1mg  three times daily AS NEEDED for anxiety or sleep  Continue Senna 2 PO BID by mouth for bowel prophylaxis  Goals of Care and Additional Recommendations:  Limitations on Scope of Treatment: Full Scope Treatment  Code Status:  DNR  Prognosis:   Unable to determine  Discharge Planning:  Home with [place] was discussed with patient, her sisters, and attending provider.   Thank you for allowing the Palliative Medicine Team to assist in the care of this patient.   Time In: 1400 Time Out: 1435 Total Time 35 minutes Prolonged Time Billed no      Greater than 50%  of this time was spent counseling and coordinating care related to the above assessment and plan.  Mariana Kaufman, AGNP-C Palliative Medicine   Please contact Palliative Medicine Team phone at [phone] for questions and concerns.

## 2020-07-04 NOTE — Progress Notes (Addendum)
Physical Therapy Treatment Patient Details Name: Stacey [name] MRN: [id number] DOB: 1946/04/26 Today's Date: 07/04/2020    History of Present Illness 74 y.o. female with medical history significant for metastatic breast cancer, CAD, paroxysmal atrial fibrillation, COPD, and morbid obesity who presented to the ED with worsening left hip pain. CT=new sclerotic lesion to left pubic ramus    PT Comments    Pt reports pain has decreased from 10/10 to 7/10.  Overall improvement in activity tolerance noted today. Pt ambulated 39' with RW, no loss of balance. No physical assist needed for supine to sit nor for sit to stand. Pt's sisters were present for PT session. They are able to intermittently assist pt at home, but cannot perform any lifting due to back issues. Pt's son also can assist pt at home. Pt feels she can manage at home with assist from family. Pt did not require lifting assist this session.   Follow Up Recommendations  Home health PT;Supervision for mobility/OOB     Equipment Recommendations  Hospital bed  Recommendations for Other Services       Precautions / Restrictions Precautions Precautions: Fall Restrictions Weight Bearing Restrictions: No    Mobility  Bed Mobility Overal bed mobility: Modified Independent Bed Mobility: Supine to Sit     Supine to sit: Modified independent (Device/Increase time);HOB elevated     General bed mobility comments: used rail, no physical assist needed  Transfers Overall transfer level: Needs assistance Equipment used: Rolling walker (2 wheeled) Transfers: Sit to/from Stand Sit to Stand: Supervision         General transfer comment: verbal cues for hand placement  Ambulation/Gait Ambulation/Gait assistance: Supervision Gait Distance (Feet): 90 Feet Assistive device: Rolling walker (2 wheeled) Gait Pattern/deviations: Step-to pattern;Wide base of support Gait velocity: WFL   General Gait Details: steady, no loss of  balance, no dyspnea, 7/10 L posterior hip pain   Stairs             Wheelchair Mobility    Modified Rankin (Stroke Patients Only)       Balance Overall balance assessment: Needs assistance Sitting-balance support: Feet supported;No upper extremity supported Sitting balance-Leahy Scale: Good     Standing balance support: No upper extremity supported Standing balance-Leahy Scale: Fair Standing balance comment: reliant on UEs for dynamic tasks                            Cognition Arousal/Alertness: Awake/alert Behavior During Therapy: WFL for tasks assessed/performed Overall Cognitive Status: Within Functional Limits for tasks assessed                                        Exercises      General Comments        Pertinent Vitals/Pain Pain Score: 7  Pain Location: L hip and lower back  Pain Descriptors / Indicators: Aching Pain Intervention(s): Limited activity within patient's tolerance;Monitored during session;Premedicated before session    Home Living                      Prior Function            PT Goals (current goals can now be found in the care plan section) Acute Rehab PT Goals Patient Stated Goal: home when ready PT Goal Formulation: With patient/family Time For Goal Achievement: 07/06/20 Potential to  Achieve Goals: Fair Progress towards PT goals: Progressing toward goals    Frequency    Min 3X/week      PT Plan Current plan remains appropriate    Co-evaluation              AM-PAC PT "6 Clicks" Mobility   Outcome Measure  Help needed turning from your back to your side while in a flat bed without using bedrails?: None Help needed moving from lying on your back to sitting on the side of a flat bed without using bedrails?: A Little Help needed moving to and from a bed to a chair (including a wheelchair)?: None Help needed standing up from a chair using your arms (e.g., wheelchair or bedside  chair)?: None Help needed to walk in hospital room?: None Help needed climbing 3-5 steps with a railing? : A Little 6 Click Score: 22    End of Session Equipment Utilized During Treatment: Gait belt Activity Tolerance: Patient tolerated treatment well Patient left: with call bell/phone within reach;in chair;with family/visitor present (alarm not set on arrival, left same ) Nurse Communication: Mobility status PT Visit Diagnosis: Other abnormalities of gait and mobility (R26.89);Muscle weakness (generalized) (M62.81);Pain Pain - Right/Left: Left Pain - part of body: Hip     Time: 1350-1413 PT Time Calculation (min) (ACUTE ONLY): 23 min  Charges:  $Gait Training: 8-22 mins $Therapeutic Activity: 8-22 mins                     Blondell Reveal Kistler PT 07/04/2020  Acute Rehabilitation Services Pager [phone] Office [phone]

## 2020-07-05 ENCOUNTER — Telehealth: Payer: Self-pay | Admitting: *Deleted

## 2020-07-05 DIAGNOSIS — C801 Malignant (primary) neoplasm, unspecified: Secondary | ICD-10-CM

## 2020-07-05 LAB — BASIC METABOLIC PANEL
Anion gap: 7 (ref 5–15)
BUN: 28 mg/dL — ABNORMAL HIGH (ref 8–23)
CO2: 29 mmol/L (ref 22–32)
Calcium: 8.7 mg/dL — ABNORMAL LOW (ref 8.9–10.3)
Chloride: 102 mmol/L (ref 98–111)
Creatinine, Ser: 0.75 mg/dL (ref 0.44–1.00)
GFR calc Af Amer: >60 mL/min (ref >60)
GFR calc non Af Amer: >60 mL/min (ref >60)
Glucose, Bld: 103 mg/dL — ABNORMAL HIGH (ref 70–99)
Potassium: 4.8 mmol/L (ref 3.5–5.1)
Sodium: 138 mmol/L (ref 135–145)

## 2020-07-05 LAB — PROTIME-INR
INR: 4.2 (ref 0.8–1.2)
Prothrombin Time: 39.2 seconds — ABNORMAL HIGH (ref 11.4–15.2)

## 2020-07-05 MED ORDER — LIDOCAINE 5 % EX PTCH: 2.0000 | MEDICATED_PATCH | CUTANEOUS | 0 refills | Status: DC

## 2020-07-05 MED ORDER — LOSARTAN POTASSIUM 50 MG PO TABS: 100.0000 mg | ORAL_TABLET | Freq: Once | ORAL | Status: DC

## 2020-07-05 MED ORDER — CLONAZEPAM 1 MG PO TBDP: 1.0000 mg | ORAL_TABLET | Freq: Three times a day (TID) | ORAL | 0 refills | Status: DC | PRN

## 2020-07-05 MED ORDER — METHADONE HCL 5 MG PO TABS: 5.0000 mg | ORAL_TABLET | Freq: Four times a day (QID) | ORAL | 0 refills | Status: DC

## 2020-07-05 MED ORDER — ACETAMINOPHEN 325 MG PO TABS: 650.0000 mg | ORAL_TABLET | Freq: Three times a day (TID) | ORAL | 0 refills | Status: DC

## 2020-07-05 MED ORDER — DEXAMETHASONE 4 MG PO TABS: 8.0000 mg | ORAL_TABLET | Freq: Every day | ORAL | 0 refills | Status: AC

## 2020-07-05 MED ORDER — OXYCODONE-ACETAMINOPHEN 5-325 MG PO TABS: 2.0000 | ORAL_TABLET | Freq: Every day | ORAL | 0 refills | Status: DC

## 2020-07-05 MED ORDER — DULOXETINE HCL 30 MG PO CPEP: 90.0000 mg | ORAL_CAPSULE | Freq: Every day | ORAL | 0 refills | Status: DC

## 2020-07-05 MED ORDER — B COMPLEX PO TABS: 1.0000 | ORAL_TABLET | Freq: Every morning | ORAL | 0 refills | Status: DC

## 2020-07-05 NOTE — Progress Notes (Signed)
ANTICOAGULATION CONSULT NOTE - Follow Up Consult  Pharmacy Consult for warfarin Indication: hx atrial fibrillation  Allergies  Allergen Reactions  . Brilinta [Ticagrelor] Other (See Comments)    Nausea and severe diarrhea- general weakness. Patient says does not want to take again  . Albuterol Other (See Comments)    Heart racing     Patient Measurements: Height: 5' (152.4 cm) Weight: 91.3 kg (201 lb 3.2 oz) IBW/kg (Calculated) : 45.5 Heparin Dosing Weight:   Vital Signs: Temp: 98.6 F (37 C) (08/05 0538) Temp Source: Oral (08/05 0538) BP: 186/69 (08/05 0538) Pulse Rate: 67 (08/05 0538)  Labs: Recent Labs    07/03/20 0608 07/04/20 0613 07/05/20 0546  HGB 10.7* 10.9*  --   HCT 34.2* 35.3*  --   PLT 256 239  --   LABPROT 31.9* 44.9* 39.2*  INR 3.2* 5.0* 4.2*  CREATININE 1.06*  --  0.75    Estimated Creatinine Clearance: 63.1 mL/min (by C-G formula based on SCr of 0.75 mg/dL).   Medications:  PTA warfarin regimen: 3 mg daily  Assessment: Patient is a 74 y.o F with metastatic breast cancer, chronic pain and hx afib on warfarin PTA, presented to the ED on 7/27 with c/o hip pain.  Pharmacy is consulted to manage pt's warfarin inpatient.  - 7/30: vit K 2.5mg  PO x1   Today, 07/05/2020: - INR is supra-therapeutic but down to 4.2 after dose held yesterday - cbc on 8/4 ok  - no bleeding documented - on heart healthy diet - no significant drug-drug intxns  Goal of Therapy:  INR 2-3 Monitor platelets by anticoagulation protocol: Yes   Plan:  - continue to hold warfarin today - f/u INR and resume if/when appropriate - monitor for s/sx bleeding - daily INR  Stacey [name] P 07/05/2020,8:03 AM

## 2020-07-05 NOTE — Progress Notes (Signed)
Patient Stable and ready for d/c home with family.Leaving now.

## 2020-07-05 NOTE — Plan of Care (Signed)

## 2020-07-05 NOTE — Discharge Summary (Signed)
Discharge Summary  Stacey [name]:[id number] DOB: 08/09/1946  PCP: Stacey [name]  Admit date: 06/26/2020 Discharge date: 07/05/2020  Time spent: 44mins, more than 50% time spent on coordination of care.   Recommendations for Outpatient Follow-up:  1. F/u with [facility] oncology Stacey [name] in a week for metastatic breast cancer, getting monthly fulvestrant injection 2. F/u with [facility] palliative care Stacey [name] within a week  for hospital discharge follow up, [place] oncology and palliative care to decide on dexamethasone taper and duration. 3. Home health arranged  Discharge Diagnoses:  Active Hospital Problems   Diagnosis Date Noted  . 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 Left hip pain   . Pain of metastatic malignancy 06/27/2020  . 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 Malignant neoplasm of right breast in female, estrogen receptor positive ([place])   . Goals of care, counseling/discussion   . Advanced care planning/counseling discussion     Resolved Hospital Problems  No resolved problems to display.    Discharge Condition: stable  Diet recommendation: heart healthy  Filed Weights   06/27/20 1552 06/29/20 1225  Weight: (!) 92.5 kg 91.3 kg    History of present illness: (Per admitting MD Stacey. Manuella [name]) Patient coming from: [facility]  Chief Complaint: Left hip pain  HPI: Stacey [name] is a 74 y.o. female with medical history significant for metastatic breast cancer, CAD, paroxysmal atrial fibrillation, COPD, and morbid obesity who presented to the ED with worsening left hip pain, despite taking her methadone 3 times daily and oxycodone 10 mg every 3 hours as needed for breakthrough pain.  This has been ongoing for the last several days and she finds it very difficult to ambulate or perform any activities of daily living on account of her severe pain.  She denies any new trauma, numbness/weakness, fevers or chills, chest pain, shortness of  breath, nausea, vomiting, or diarrhea.  She continues to follow-up at [facility] with her oncologist for ongoing cancer treatments and would like to be able to make it there for further discussion of ongoing treatment and also discussed with the palliative care team at that facility.  Patient currently does not have a safe discharge plan as family cannot care for her at home and she appears to have lost her bed at [facility].   ED Course: Vital signs stable and patient is afebrile.  Patient has been seen by palliative care with plans for further pain control pending with possible need for steroids.  She recently presented to the [facility] ED on 06/24/2020 with new, worsening left hip pain. Bilateral hip x-ray performed at that time showed a probable sclerotic metastasis attheleft inferior pubic ramus. She was discharged with additional pain medication. However, she returned on 06/26/2020 with increasing pain that limits her ambulation and ability to perform her daily activities,poorly controlled with oral pain medications.She was initially hoping to get back in with her radiation oncologist over at [facility] but they were unable to see her until sometime next week. Because her pain has been poorly controlled with oral pain medications, she requested to be transferred to [facility] for consideration of palliative radiotherapy sooner.  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 Methadone 5mg every 6  hourspo ? Acetaminophen 650mg  every 8 hours po ? Lidocaine patches to both hips- leave on for 12 hrs, remove for 12 hours, repeat daily ? Dexamethasone 8mg  po daily- recommend continuing until she follows up with her outpatient Palliative provider to allow time for delayed full benefit of radiation treatment ? Oxycodone 10mg  every 3 hours by mouth AS NEEDED FOR BREAKTHROUGH PAIN ? Oxycodone-Acetaminophen 5-325- TWO TABLETS AT BEDTIME every night by mouth  ? Cymbalta 90 mg po daily ? Pregabalin 150mg  once daily by mouth ? Clonazepam 1mg  three times daily AS NEEDED for anxiety or sleep ? Continue Senna 2 PO BID by mouth for bowel prophylaxis she is doing well and improving on this regimen, she is to follow-up with oncology/ palliative care at [name], dexamethasone dose and duration to be determined by [facility] oncology palliative care.  Metastatic breast cancer -She stopped taking Femara, she is currently getting monthly injection for restaurant -She is to follow with [facility] oncology Stacey. Harden [name]  AKI on CKDII /azotemia/anemia of chronic disease --BUN peaked at 41, creatinine peaked at 1.06 -BUN 28/creatinine 0.75 at discharge --Hemoglobin stable at baseline -Home diuretics held in hospital, resumed at discharge -Repeat BMP at hospital discharge follow-up, renal dosing medication  History of renal cell carcinoma -Report has an enlarging renal lesion, she  is in the process of getting referral to urology at [facility]  paroxysmal atrial fibrillationwith supratherapeutic INR, - no sign of bleeding -Currently rate controlled -Continue metoprolol -INR down from 5 to 4, repeat inr tomorrow and Saturday, resume coumadin at home dose   once INR less than 3 -home coumadin dose:  1.5mg  on teusday and Saturday, 2mg  on other days  CAD -Continue atorvastatin and Plavix as well as metoprolol -no cheat pain   Chronic combined systolic and diastolic CHF -Last 2D echocardiogram on 03/2020 with LVEF  35-40% with grade 1 diastolic dysfunction -home diuretics held in the hospital due to AKI, resumed at discharge -Monitor daily weights, strict I/O,-Appears euvolemic, no edema  Hypertension: Continue metoprolol XL and Cozaar  FTT; arrange home health and DME  Body mass index is 39.29 kg/m.    Code Status: DNR Family Communication: Family at bedside on 8/4, son over the phone on 8/5 Disposition:   Status is: Inpatient    Dispo: The patient is from: SNF, she lost her bed at SNF  Anticipated d/c is to: Home with home health  Consultants:   Radiation oncology  Palliative care  Procedures:   Radiation treatmentx1  Antimicrobials:    None   Discharge Exam: BP (!) 186/69 (BP Location: Left Arm)   Pulse 67   Temp 98.6 F (37 C) (Oral)   Resp 18   Ht 5' (1.524 m)   Wt 91.3 kg   SpO2 99%   BMI 39.29 kg/m   General: NAD, pleasant Cardiovascular: RRR Respiratory: Clear to auscultation bilaterally  Discharge Instructions You were cared for by a hospitalist during your hospital stay. If you have any questions about your discharge medications or the care you received while you were in the hospital after you are discharged, you can call the unit and asked to speak with the hospitalist on call if the hospitalist that took care of you is not available. Once you are  discharged, your primary care physician will handle any further medical issues. Please note that NO REFILLS for any discharge medications will be authorized once you are discharged, as it is imperative that you return to your primary care physician (or establish a relationship with a primary care physician if you do not have one) for your aftercare needs so that they can reassess your need for medications and monitor your lab values.  Discharge Instructions    Diet - low sodium heart healthy   Complete by: As directed    Increase activity slowly   Complete by: As directed       Allergies as of 07/05/2020      Reactions   Brilinta [ticagrelor] Other (See Comments)   Nausea and severe diarrhea- general weakness. Patient says does not want to take again   Albuterol Other (See Comments)   Heart racing      Medication List    STOP taking these medications   guaiFENesin 600 MG 12 hr tablet Commonly known as: MUCINEX   warfarin 1 MG tablet Commonly known as: COUMADIN     TAKE these medications   acetaminophen 325 MG tablet Commonly known as: TYLENOL Take 2 tablets (650 mg total) by mouth every 8 (eight) hours.   atorvastatin 80 MG tablet Commonly known as: LIPITOR Take 1 tablet (80 mg total) by mouth daily.   b complex vitamins tablet Take 1 tablet by mouth every morning.   budesonide 0.25 MG/2ML nebulizer solution Commonly known as: PULMICORT Take 0.25 mg by nebulization 2 (two) times daily.   carboxymethylcellulose 0.5 % Soln Commonly known as: REFRESH PLUS Place 1 drop into both eyes in the morning, at noon, in the evening, and at bedtime.   Citracal Maximum 315-250 MG-UNIT Tabs Generic drug: Calcium Citrate-Vitamin D Take 2 tablets by mouth daily.   clonazePAM 1 MG disintegrating tablet Commonly known as: KLONOPIN Take 1 tablet (1 mg total) by mouth 3 (three) times daily as needed (anxiety).   clopidogrel 75 MG tablet Commonly known as: PLAVIX Take 1 tablet (75 mg total) by mouth daily.   dexamethasone 4 MG tablet Commonly known as: DECADRON Take 2 tablets (8 mg total) by mouth daily for 14 days. Start taking on: July 06, 2020   DULoxetine 30 MG capsule Commonly known as: CYMBALTA Take 3 capsules (90 mg total) by mouth daily. Start taking on: July 06, 2020 What changed:   medication strength  how much to take  Another medication with the same name was removed. Continue taking this medication, and follow the directions you see here.   EQ VISION FORMULA 50+ PO Take 1 tablet by mouth every morning.   lidocaine 5  % Commonly known as: LIDODERM Place 2 patches onto the skin daily. Remove & Discard patch within 12 hours or as directed by MD   losartan 100 MG tablet Commonly known as: COZAAR Take 1 tablet (100 mg total) by mouth daily.   methadone 5 MG tablet Commonly known as: DOLOPHINE Take 1 tablet (5 mg total) by mouth 4 (four) times daily. What changed: when to take this   metoprolol succinate 100 MG 24 hr tablet Commonly known as: TOPROL-XL TAKE 1 AND 1/2 TABLETS DAILY What changed:   how much to take  how to take this  when to take this  additional instructions   nitroGLYCERIN 0.4 MG SL tablet Commonly known as: Nitrostat Place 1 tablet (0.4 mg total) under the tongue every 5 (five) minutes x 3 doses  as needed for chest pain (if no relief after 3rd dose, proceed to the ED or call 911).   oxycodone 5 MG capsule Commonly known as: OXY-IR Take 10 mg by mouth every 3 (three) hours as needed for pain.   oxyCODONE-acetaminophen 5-325 MG tablet Commonly known as: PERCOCET/ROXICET Take 2 tablets by mouth at bedtime.   polyethylene glycol powder 17 GM/SCOOP powder Commonly known as: GLYCOLAX/MIRALAX Take 17 g by mouth daily. Mixed in 4-8 ounces of liquid   pregabalin 150 MG capsule Commonly known as: LYRICA Take 150 mg by mouth 2 (two) times daily.   SALONPAS LIDOCAINE PLUS EX Apply 1 patch topically daily. Applied to back and/or right side buttock area   senna-docusate 8.6-50 MG tablet Commonly known as: Senokot-S Take 2 tablets by mouth in the morning and at bedtime.   spironolactone 25 MG tablet Commonly known as: ALDACTONE TAKE 1/2 TABLET(12.5 MG) BY MOUTH DAILY What changed:   how much to take  how to take this  when to take this  additional instructions            Durable Medical Equipment  (From admission, onward)         Start     Ordered   07/04/20 1353  For home use only DME Bedside commode  Once       Comments: Patient needs a bariatric  bedside commode, thank you  Question:  Patient needs a bedside commode to treat with the following condition  Answer:  FTT (failure to thrive) in adult   07/04/20 1353   07/03/20 1324  For home use only DME Hospital bed  Once       Question Answer Comment  Length of Need Lifetime   Patient has (list medical condition): metastatic breast cancer   The above medical condition requires: Patient requires the ability to reposition frequently   Head must be elevated greater than: 30 degrees   Bed type Semi-electric      07/03/20 1324         Allergies  Allergen Reactions  . Brilinta [Ticagrelor] Other (See Comments)    Nausea and severe diarrhea- general weakness. Patient says does not want to take again  . Albuterol Other (See Comments)    Heart racing     Follow-up Information    [facility] Follow up.   Specialty: Home Health Services Contact information: [address] [place] [address] [phone]        Tyler Pita, MD Follow up.   Specialty: Radiation Oncology Why: as needed  Contact information: [address] [place] [address] [phone]        Coumadin clinic in [place] Follow up.   Why: Please check INR Friday and Saturday, resume Coumadin once INR less than 3       Darral Dash, MD Follow up.   Specialty: Internal Medicine Contact information: [facility] [address] [place] [address] [phone]                The results of significant diagnostics from this hospitalization (including imaging, microbiology, ancillary and laboratory) are listed below for reference.    Significant Diagnostic Studies: DG Lumbar Spine Complete  Result Date: 06/24/2020 CLINICAL DATA:  Pain, known metastatic breast cancer EXAM: LUMBAR SPINE - COMPLETE 4+ VIEW COMPARISON:  CT abdomen and pelvis 04/24/2020 FINDINGS: Five non-rib-bearing lumbar vertebra with partial sacralization of LEFT transverse  process of L5. Diffuse osseous demineralization. Levoconvex  thoracolumbar scoliosis. Multilevel disc space narrowing and endplate spur formation. Multilevel facet degenerative changes. Sclerosis at L2 corresponding to known sclerotic metastasis. Chronic superior endplate concavity at L1 unchanged. No acute fracture or bone destruction. Minimal anterolisthesis L4-L5 likely degenerative. Atherosclerotic calcifications aorta. IMPRESSION: Multilevel degenerative disc and facet disease changes of lumbar spine with levoconvex scoliosis. Unchanged mild superior endplate concavity at L1. Known sclerotic metastasis L2. No acute abnormalities. Electronically Signed   By: Lavonia Dana M.D.   On: 06/24/2020 16:40   DG HIPS BILAT WITH PELVIS 2V  Result Date: 06/24/2020 CLINICAL DATA:  BILATERAL hip pain, new and worse on LEFT EXAM: DG HIP (WITH OR WITHOUT PELVIS) 2V BILAT COMPARISON:  CT abdomen and pelvis 04/24/2020 FINDINGS: Osseous demineralization. Hip and SI joint spaces preserved. Facet degenerative changes at lower lumbar spine. No acute fracture, dislocation, or bone destruction. Sclerotic focus identified at the LEFT inferior pubic ramus, corresponding to a sclerotic region on CT likely a sclerotic metastasis. IMPRESSION: Probable sclerotic metastasis at the LEFT inferior pubic ramus. No additional acute osseous abnormalities. Electronically Signed   By: Lavonia Dana M.D.   On: 06/24/2020 16:43   DG Hip Unilat With Pelvis 2-3 Views Left  Result Date: 06/26/2020 CLINICAL DATA:  Increasing pelvic pain common no known injury, initial encounter EXAM: DG HIP (WITH OR WITHOUT PELVIS) 3V LEFT COMPARISON:  None. FINDINGS: Pelvic ring is intact. Mild degenerative changes of the hip joints are noted bilaterally. No acute fracture or dislocation is seen. No soft tissue abnormality is noted. IMPRESSION: Degenerative change without acute abnormality. Electronically Signed   By: Inez Catalina M.D.   On: 06/26/2020 22:21     Microbiology: No results found for this or any previous visit (from the past 240 hour(s)).   Labs: Basic Metabolic Panel: Recent Labs  Lab 06/30/20 0623 07/01/20 1222 07/02/20 0633 07/03/20 0608 07/05/20 0546  NA 137 139 140 140 138  K 3.9 4.6 4.2 4.3 4.8  CL 96* 98 100 101 102  CO2 32 34* 31 32 29  GLUCOSE 110* 110* 105* 134* 103*  BUN 41* 33* 32* 30* 28*  CREATININE 1.04* 0.91 0.94 1.06* 0.75  CALCIUM 8.5* 8.4* 8.4* 8.4* 8.7*  MG  --   --   --  2.5*  --   PHOS  --   --   --  2.1*  --    Liver Function Tests: Recent Labs  Lab 07/02/20 0633 07/03/20 0608  AST 20 19  ALT 11 13  ALKPHOS 42 44  BILITOT 0.7 0.7  PROT 6.2* 6.1*  ALBUMIN 3.2* 3.3*   No results for input(s): LIPASE, AMYLASE in the last 168 hours. No results for input(s): AMMONIA in the last 168 hours. CBC: Recent Labs  Lab 06/29/20 0445 07/01/20 1222 07/02/20 0633 07/03/20 0608 07/04/20 0613  WBC 5.2 6.3 4.9 5.2 6.0  HGB 11.4* 10.5* 10.2* 10.7* 10.9*  HCT 36.4 33.9* 32.5* 34.2* 35.3*  MCV 101.4* 102.4* 102.5* 104.9* 105.1*  PLT 271 249 247 256 239   Cardiac Enzymes: No results for input(s): CKTOTAL, CKMB, CKMBINDEX, TROPONINI in the last 168 hours. BNP: BNP (last 3 results) No results for input(s): BNP in the last 8760 hours.  ProBNP (last 3 results) No results for input(s): PROBNP in the last 8760 hours.  CBG: No results for input(s): GLUCAP in the last 168 hours.     Signed:  Florencia Reasons MD, PhD, FACP  [facility] 07/05/2020, 11:57 AM

## 2020-07-05 NOTE — Telephone Encounter (Signed)
Spoke with patient.  She was discharged home from [facility] today.  She is on warfarin and D/C INR was 4 down from 5.  Per patient and D/C summary pt needs INR on Friday and Saturday. She can resume home dose of warfarin 1.5mg  on Tuesdays and Saturdays, 2mg  on other days when INR is below 3.  Pt was discharged home with [facility].  I called [facility] and spoke with Vicky.  She stated pt has been scheduled for nurse (Amy) to see tomorrow (Fri) and they do have orders for INR check  x 2 days.  I provided the phone number to the [facility] [phone]) for the [facility] Nurse to call results to tomorrow in my absence.

## 2020-07-05 NOTE — Plan of Care (Signed)
  Problem: Education: Goal: Knowledge of General Education information will improve Description: Including pain rating scale, medication(s)/side effects and non-pharmacologic comfort measures 07/05/2020 1005 by Lyndal Pulley, RN Outcome: Adequate for Discharge 07/05/2020 1004 by Lyndal Pulley, RN Outcome: Adequate for Discharge   Problem: Health Behavior/Discharge Planning: Goal: Ability to manage health-related needs will improve 07/05/2020 1005 by Lyndal Pulley, RN Outcome: Adequate for Discharge 07/05/2020 1004 by Lyndal Pulley, RN Outcome: Adequate for Discharge   Problem: Clinical Measurements: Goal: Ability to maintain clinical measurements within normal limits will improve 07/05/2020 1005 by Lyndal Pulley, RN Outcome: Adequate for Discharge 07/05/2020 1004 by Lyndal Pulley, RN Outcome: Adequate for Discharge Goal: Will remain free from infection 07/05/2020 1005 by Lyndal Pulley, RN Outcome: Adequate for Discharge 07/05/2020 1004 by Lyndal Pulley, RN Outcome: Adequate for Discharge Goal: Diagnostic test results will improve 07/05/2020 1005 by Lyndal Pulley, RN Outcome: Adequate for Discharge 07/05/2020 1004 by Lyndal Pulley, RN Outcome: Adequate for Discharge Goal: Respiratory complications will improve 07/05/2020 1005 by Lyndal Pulley, RN Outcome: Adequate for Discharge 07/05/2020 1004 by Lyndal Pulley, RN Outcome: Adequate for Discharge Goal: Cardiovascular complication will be avoided 07/05/2020 1005 by Lyndal Pulley, RN Outcome: Adequate for Discharge 07/05/2020 1004 by Lyndal Pulley, RN Outcome: Adequate for Discharge   Problem: Activity: Goal: Risk for activity intolerance will decrease 07/05/2020 1005 by Lyndal Pulley, RN Outcome: Adequate for Discharge 07/05/2020 1004 by Lyndal Pulley, RN Outcome: Adequate for Discharge   Problem: Nutrition: Goal: Adequate nutrition will be maintained 07/05/2020 1005 by Lyndal Pulley,  RN Outcome: Adequate for Discharge 07/05/2020 1004 by Lyndal Pulley, RN Outcome: Adequate for Discharge   Problem: Coping: Goal: Level of anxiety will decrease 07/05/2020 1005 by Lyndal Pulley, RN Outcome: Adequate for Discharge 07/05/2020 1004 by Lyndal Pulley, RN Outcome: Adequate for Discharge   Problem: Elimination: Goal: Will not experience complications related to bowel motility 07/05/2020 1005 by Lyndal Pulley, RN Outcome: Adequate for Discharge 07/05/2020 1004 by Lyndal Pulley, RN Outcome: Adequate for Discharge Goal: Will not experience complications related to urinary retention 07/05/2020 1005 by Lyndal Pulley, RN Outcome: Adequate for Discharge 07/05/2020 1004 by Lyndal Pulley, RN Outcome: Adequate for Discharge   Problem: Pain Managment: Goal: General experience of comfort will improve 07/05/2020 1005 by Lyndal Pulley, RN Outcome: Adequate for Discharge 07/05/2020 1004 by Lyndal Pulley, RN Outcome: Adequate for Discharge   Problem: Safety: Goal: Ability to remain free from injury will improve 07/05/2020 1005 by Lyndal Pulley, RN Outcome: Adequate for Discharge 07/05/2020 1004 by Lyndal Pulley, RN Outcome: Adequate for Discharge   Problem: Skin Integrity: Goal: Risk for impaired skin integrity will decrease 07/05/2020 1005 by Lyndal Pulley, RN Outcome: Adequate for Discharge 07/05/2020 1004 by Lyndal Pulley, RN Outcome: Adequate for Discharge

## 2020-07-09 ENCOUNTER — Ambulatory Visit (INDEPENDENT_AMBULATORY_CARE_PROVIDER_SITE_OTHER): Payer: Medicare Other | Admitting: *Deleted

## 2020-07-09 DIAGNOSIS — I4891 Unspecified atrial fibrillation: Secondary | ICD-10-CM | POA: Diagnosis not present

## 2020-07-09 DIAGNOSIS — Z5181 Encounter for therapeutic drug level monitoring: Secondary | ICD-10-CM

## 2020-07-09 LAB — POCT INR: INR: 2.8 (ref 2.0–3.0)

## 2020-07-09 NOTE — Patient Instructions (Signed)
Warfarin has been on hold since 8/5 per hospital D/C.  INR was 4.2 on 8/5 Restart warfarin 2 tablets daily except 1 1/2 tablets on Tuesdays and Saturdays  Recheck in 1 week Orders given to [facility]

## 2020-07-16 ENCOUNTER — Other Ambulatory Visit: Payer: Self-pay | Admitting: Orthopaedic Surgery

## 2020-07-16 ENCOUNTER — Ambulatory Visit (INDEPENDENT_AMBULATORY_CARE_PROVIDER_SITE_OTHER): Payer: Medicare Other | Admitting: *Deleted

## 2020-07-16 ENCOUNTER — Telehealth: Payer: Self-pay | Admitting: *Deleted

## 2020-07-16 DIAGNOSIS — Z5181 Encounter for therapeutic drug level monitoring: Secondary | ICD-10-CM | POA: Diagnosis not present

## 2020-07-16 DIAGNOSIS — I4891 Unspecified atrial fibrillation: Secondary | ICD-10-CM | POA: Diagnosis not present

## 2020-07-16 DIAGNOSIS — M25512 Pain in left shoulder: Secondary | ICD-10-CM

## 2020-07-16 LAB — POCT INR: INR: 3.6 — AB (ref 2.0–3.0)

## 2020-07-16 NOTE — Telephone Encounter (Signed)
INR 3.6 Please call Inez Catalina [phone]

## 2020-07-16 NOTE — Patient Instructions (Signed)
Hold warfarin tonight then decrease dose to 1 1/2 tablets daily except 2 tablets on Mondays, Wednesdays and Fridays Recheck in 1 week Orders given to [facility]

## 2020-07-16 NOTE — Telephone Encounter (Signed)
LM on voice mail for [facility] with coumadin instructions.

## 2020-07-18 ENCOUNTER — Telehealth: Payer: Self-pay | Admitting: Cardiology

## 2020-07-18 NOTE — Telephone Encounter (Signed)
Stacey [name] aware that we will no longer be doing home health orders for this pt as we did so in the past as a courtesy with the impression that this pt would be establishing with pcp

## 2020-07-18 NOTE — Telephone Encounter (Signed)
Needs order for Occupational Therapy for 1x a week for 3 weeks then 2x a week for 2 weeks

## 2020-07-22 ENCOUNTER — Emergency Department (HOSPITAL_COMMUNITY)
Admission: EM | Admit: 2020-07-22 | Discharge: 2020-07-22 | Disposition: A | Discharge status: Home / Self Care | Payer: Medicare Other | Attending: Emergency Medicine | Admitting: Emergency Medicine

## 2020-07-22 ENCOUNTER — Emergency Department (HOSPITAL_BASED_OUTPATIENT_CLINIC_OR_DEPARTMENT_OTHER): Payer: Medicare Other

## 2020-07-22 ENCOUNTER — Other Ambulatory Visit: Payer: Self-pay

## 2020-07-22 ENCOUNTER — Emergency Department (HOSPITAL_COMMUNITY): Payer: Medicare Other

## 2020-07-22 DIAGNOSIS — C50919 Malignant neoplasm of unspecified site of unspecified female breast: Secondary | ICD-10-CM | POA: Diagnosis not present

## 2020-07-22 DIAGNOSIS — I110 Hypertensive heart disease with heart failure: Secondary | ICD-10-CM | POA: Diagnosis not present

## 2020-07-22 DIAGNOSIS — M79609 Pain in unspecified limb: Secondary | ICD-10-CM

## 2020-07-22 DIAGNOSIS — I214 Non-ST elevation (NSTEMI) myocardial infarction: Secondary | ICD-10-CM | POA: Diagnosis not present

## 2020-07-22 DIAGNOSIS — M25551 Pain in right hip: Secondary | ICD-10-CM | POA: Diagnosis not present

## 2020-07-22 DIAGNOSIS — J441 Chronic obstructive pulmonary disease with (acute) exacerbation: Secondary | ICD-10-CM | POA: Insufficient documentation

## 2020-07-22 DIAGNOSIS — C7951 Secondary malignant neoplasm of bone: Secondary | ICD-10-CM | POA: Insufficient documentation

## 2020-07-22 DIAGNOSIS — Z79899 Other long term (current) drug therapy: Secondary | ICD-10-CM | POA: Diagnosis not present

## 2020-07-22 DIAGNOSIS — I5043 Acute on chronic combined systolic (congestive) and diastolic (congestive) heart failure: Secondary | ICD-10-CM | POA: Insufficient documentation

## 2020-07-22 DIAGNOSIS — Z87891 Personal history of nicotine dependence: Secondary | ICD-10-CM | POA: Insufficient documentation

## 2020-07-22 DIAGNOSIS — M25552 Pain in left hip: Secondary | ICD-10-CM | POA: Diagnosis not present

## 2020-07-22 DIAGNOSIS — M79651 Pain in right thigh: Secondary | ICD-10-CM | POA: Diagnosis present

## 2020-07-22 DIAGNOSIS — I2510 Atherosclerotic heart disease of native coronary artery without angina pectoris: Secondary | ICD-10-CM | POA: Diagnosis not present

## 2020-07-22 MED ORDER — HYDROMORPHONE HCL 1 MG/ML IJ SOLN
1.0000 mg | Freq: Once | INTRAMUSCULAR | Status: AC
  Administered 2020-07-22: 1 mg via INTRAMUSCULAR
  Filled 2020-07-22: qty 1

## 2020-07-22 NOTE — Discharge Instructions (Signed)
Your workup today was overall reassuring. Please use the contact formation for Dr. Tammi Klippel to follow-up after your hip radiation.  Please make sure to reach out to your palliative care doctors for further pain management.  Return to the ER if your symptoms worsen.

## 2020-07-22 NOTE — Progress Notes (Signed)
Right lower extremity venous duplex completed. Refer to "CV Proc" under chart review to view preliminary results.  07/22/2020 5:01 PM Kelby Aline., MHA, RVT, RDCS, RDMS

## 2020-07-22 NOTE — ED Provider Notes (Signed)
[facility] DEPT Provider Note   CSN: [id number] Arrival date & time: 07/22/20  1130     History Chief Complaint  Patient presents with   Leg Pain    Stacey [name] is a 74 y.o. female.  HPI 74 year old female with extensive medical history including breast cancer with mets to the spine and pelvis, hypertension, COPD, CHF with an EF of 35 to 40%, morbid obesity, aortic stenosis, resents to the ER with a 2-day history of bilateral pelvic pain and right thigh pain. Onset was two days ago and progressively worsening. Daughter at bedside states she "slid" out of her chair onto the ground. Pt had reported no pain after the slid and thus they did not seek medical care. Pain has progressively worsened over the last few days. Denies any redness or swelling to her right leg. She does not endorse any numbness or tingling to her lower extremities, no loss of bowel and bladder control, no groin numbness. Pt ambulates at baseline rolling on a rollator. Has taken her home methadone, tylenol, and 1 pill of oxycodone 10 mg for pain, pain is now a 7/10.  She was admitted to the hospital on 7/28 with uncontrolled left hip pain which made her bedbound.  Plain films showed a left inferior pubic ramus metastases.  She had received radiation treatment from Dr. Tammi Klippel while in the hospital, but had no follow-up with him afterwards.  Patient states that she is here with concerns that there is something else in her hip other than the left-sided metastases and that this will cause her to be bedbound.  Patient and daughter at bedside are difficult historians.  Per chart review, patient has a history of not using outpatient follow-up appropriately.  Denies any fevers, chills, chest pain or shortness of breath.    Past Medical History:  Diagnosis Date   CAD in native artery    a. DES to ramus 2005 with late stent thrombosis 2006 tx with PTCA. 12/18 PCI/DES x1 to mRCA, EF 50-55%   CHF  (congestive heart failure) (HCC)    Chronic pain    COPD (chronic obstructive pulmonary disease) (HCC)    Hyperlipidemia    Hypertension    Left bundle branch block    Lymphedema    Metastatic breast cancer ([place])    a. to bone.   MI (myocardial infarction) ([place])    Mild aortic stenosis 10/2017   Morbid obesity ([place])    PAF (paroxysmal atrial fibrillation) ([place])    PSVT (paroxysmal supraventricular tachycardia) ([place])    a. per [facility] notes, seen on event monitor in 2014.   Pulmonary nodules     Patient Active Problem List   Diagnosis Date Noted   Metastasis to bone of unknown primary ([place]) 06/28/2020   Left hip pain    Pain of metastatic malignancy 06/27/2020   Malignant neoplasm metastatic to bone [facility])    Malignant neoplasm of right breast in female, estrogen receptor positive ([place])    Goals of care, counseling/discussion    Advanced care planning/counseling discussion    Spinal stenosis of lumbar region    Bone metastases [facility])    Palliative care by specialist    Lumbar pain 04/24/2020   Lumbar radiculopathy 02/16/2020   Palpitations 11/16/2019   Dysuria    Acute bilateral low back pain 10/02/2019   Bipolar 2 disorder, major depressive episode ([place]) 09/01/2019   Adjustment disorder with depressed mood 06/30/2019   Encounter for therapeutic drug monitoring 12/28/2018  Atrial fibrillation with rapid ventricular response ([place]) 11/14/2018   Fall    Obesity, Class III, BMI 40-49.9 (morbid obesity) ([place])    Chronic respiratory failure with hypoxia (HCC)    Elevated troponin 10/07/2018   PSVT (paroxysmal supraventricular tachycardia) ([place]) 10/07/2018   Hyperlipidemia 10/07/2018   COPD (chronic obstructive pulmonary disease) ([place]) 10/07/2018   CAD in native artery 10/07/2018   Chronic pain 10/07/2018   Acute back pain 10/07/2018   Acute hypoxemic respiratory failure ([place]) 09/19/2018   Acute on chronic combined systolic and  diastolic CHF (congestive heart failure) ([place]) 09/19/2018   COPD with acute exacerbation ([place]) 09/19/2018   Dyspnea 11/30/2017   Non-ST elevation (NSTEMI) myocardial infarction ([place])    Hypertension 11/23/2017   Left bundle branch block 11/23/2017   Acute on chronic diastolic CHF (congestive heart failure) ([place]) 11/23/2017   CAD (coronary artery disease) 11/23/2017   Metastatic breast cancer ([place]) 11/23/2017   Acute respiratory failure with hypoxia ([place]) 11/23/2017   Atypical nevus 07/29/2016   Age-related nuclear cataract, bilateral 02/12/2016   Anxiety and depression 01/24/2016   Arthropathy, lower leg 09/14/2013   Tibialis tendinitis 02/22/2013   Plantar fasciitis 01/17/2013    Past Surgical History:  Procedure Laterality Date   CORONARY STENT INTERVENTION N/A 11/26/2017   Procedure: CORONARY STENT INTERVENTION;  Surgeon: Martinique, Peter M, MD;  Location: [facility];  Service: Cardiovascular;  Laterality: N/A;   CORONARY STENT PLACEMENT     LEFT HEART CATH AND CORONARY ANGIOGRAPHY N/A 11/26/2017   Procedure: LEFT HEART CATH AND CORONARY ANGIOGRAPHY;  Surgeon: Martinique, Peter M, MD;  Location: [facility];  Service: Cardiovascular;  Laterality: N/A;     OB History   No obstetric history on file.     Family History  Problem Relation Age of Onset   CAD Father 65   Heart attack Father    COPD Sister    CAD Paternal Grandmother    Sudden Cardiac Death Neg Hx     Social History   Tobacco Use   Smoking status: Former Smoker    Packs/day: 2.00    Years: 18.00    Pack years: 36.00    Types: Cigarettes    Quit date: 12/01/1980    Years since quitting: 39.6   Smokeless tobacco: Never Used  Vaping Use   Vaping Use: Never used  Substance Use Topics   Alcohol use: No   Drug use: No    Home Medications Prior to Admission medications   Medication Sig Start Date End Date Taking? Authorizing Provider  acetaminophen (TYLENOL) 325 MG  tablet Take 2 tablets (650 mg total) by mouth every 8 (eight) hours. 07/05/20   Florencia Reasons, MD  atorvastatin (LIPITOR) 80 MG tablet Take 1 tablet (80 mg total) by mouth daily. 05/28/20   Arnoldo Lenis, MD  b complex vitamins tablet Take 1 tablet by mouth every morning. 07/05/20   Florencia Reasons, MD  budesonide (PULMICORT) 0.25 MG/2ML nebulizer solution Take 0.25 mg by nebulization 2 (two) times daily.    [provider]  Calcium Citrate-Vitamin D (CITRACAL MAXIMUM) 315-250 MG-UNIT TABS Take 2 tablets by mouth daily.     [provider]  carboxymethylcellulose (REFRESH PLUS) 0.5 % SOLN Place 1 drop into both eyes in the morning, at noon, in the evening, and at bedtime.    [provider]  clonazePAM (KLONOPIN) 1 MG disintegrating tablet Take 1 tablet (1 mg total) by mouth 3 (three) times daily as needed (anxiety).  07/05/20   Florencia Reasons, MD  clopidogrel (PLAVIX) 75 MG tablet Take 1 tablet (75 mg total) by mouth daily. 03/28/20   Arnoldo Lenis, MD  DULoxetine (CYMBALTA) 30 MG capsule Take 3 capsules (90 mg total) by mouth daily. 07/06/20   Florencia Reasons, MD  lidocaine (LIDODERM) 5 % Place 2 patches onto the skin daily. Remove & Discard patch within 12 hours or as directed by MD 07/05/20   Florencia Reasons, MD  Lidocaine HCl-Benzyl Alcohol (SALONPAS LIDOCAINE PLUS EX) Apply 1 patch topically daily. Applied to back and/or right side buttock area    [provider]  losartan (COZAAR) 100 MG tablet Take 1 tablet (100 mg total) by mouth daily. 03/25/19   Arnoldo Lenis, MD  methadone (DOLOPHINE) 5 MG tablet Take 1 tablet (5 mg total) by mouth 4 (four) times daily. 07/05/20   Florencia Reasons, MD  metoprolol succinate (TOPROL-XL) 100 MG 24 hr tablet TAKE 1 AND 1/2 TABLETS DAILY Patient taking differently: Take 150 mg by mouth daily.  03/28/20   Arnoldo Lenis, MD  Multiple Vitamins-Minerals (EQ VISION FORMULA 50+ PO) Take 1 tablet by mouth every morning.    [provider]  nitroGLYCERIN  (NITROSTAT) 0.4 MG SL tablet Place 1 tablet (0.4 mg total) under the tongue every 5 (five) minutes x 3 doses as needed for chest pain (if no relief after 3rd dose, proceed to the ED or call 911). 03/22/20   Branch, Alphonse Guild, MD  oxycodone (OXY-IR) 5 MG capsule Take 10 mg by mouth every 3 (three) hours as needed for pain.     [provider]  oxyCODONE-acetaminophen (PERCOCET/ROXICET) 5-325 MG tablet Take 2 tablets by mouth at bedtime. 07/05/20   Florencia Reasons, MD  polyethylene glycol powder (GLYCOLAX/MIRALAX) 17 GM/SCOOP powder Take 17 g by mouth daily. Mixed in 4-8 ounces of liquid    [provider]  pregabalin (LYRICA) 150 MG capsule Take 150 mg by mouth 2 (two) times daily.  08/01/19   [provider]  senna-docusate (SENOKOT-S) 8.6-50 MG tablet Take 2 tablets by mouth in the morning and at bedtime.    [provider]  spironolactone (ALDACTONE) 25 MG tablet TAKE 1/2 TABLET(12.5 MG) BY MOUTH DAILY Patient taking differently: Take 12.5 mg by mouth daily.  03/28/20   Arnoldo Lenis, MD    Allergies    Brilinta [ticagrelor] and Albuterol  Review of Systems   Review of Systems  Constitutional: Negative for chills and fever.  HENT: Negative for ear pain and sore throat.   Eyes: Negative for pain and visual disturbance.  Respiratory: Negative for cough and shortness of breath.   Cardiovascular: Negative for chest pain and palpitations.  Gastrointestinal: Negative for abdominal pain and vomiting.  Genitourinary: Negative for dysuria and hematuria.  Musculoskeletal: Positive for arthralgias. Negative for back pain.  Skin: Negative for color change and rash.  Neurological: Negative for seizures and syncope.  All other systems reviewed and are negative.   Physical Exam Updated Vital Signs BP (!) 117/57    Pulse 68    Temp 97.9 F (36.6 C) (Oral)    Resp (!) 22    Ht 5' (1.524 m)    Wt 92.1 kg    SpO2 96%    BMI 39.65 kg/m   Physical Exam Vitals and nursing  note reviewed.  Constitutional:      General: She is not in acute distress.    Appearance: She is well-developed. She is obese. She is  ill-appearing (Chronically ill-appearing).  HENT:     Head: Normocephalic and atraumatic.     Mouth/Throat:     Mouth: Mucous membranes are moist.     Pharynx: Oropharynx is clear.  Eyes:     Conjunctiva/sclera: Conjunctivae normal.  Cardiovascular:     Rate and Rhythm: Normal rate and regular rhythm.     Heart sounds: No murmur heard.   Pulmonary:     Effort: Pulmonary effort is normal. No respiratory distress.     Breath sounds: Normal breath sounds.  Abdominal:     General: Abdomen is flat.     Palpations: Abdomen is soft.     Tenderness: There is no abdominal tenderness.  Musculoskeletal:        General: Tenderness present. No swelling.     Cervical back: Neck supple.     Right lower leg: No edema.     Left lower leg: No edema.     Comments: Scattered bruises to right upper thigh.  Moving all 4 extremities without difficulty, patient able to push herself up on the bed with her legs with ease.  Has some paraspinal and midline L-spine tenderness, pain with palpation to the SI joints bilaterally.  Gross sensations intact.  Skin:    General: Skin is warm and dry.     Findings: Bruising present. No erythema.  Neurological:     General: No focal deficit present.     Mental Status: She is alert and oriented to person, place, and time.     ED Results / Procedures / Treatments   Labs (all labs ordered are listed, but only abnormal results are displayed) Labs Reviewed - No data to display  EKG None  Radiology DG Lumbar Spine Complete  Result Date: 07/22/2020 CLINICAL DATA:  Lower back pain and bilateral hip pain. EXAM: LUMBAR SPINE - COMPLETE 4+ VIEW COMPARISON:  June 24, 2020 FINDINGS: A curvilinear area of cortical irregularity is seen along the superior aspect of the L2 vertebral body. There is mild levoscoliosis. Approximately 2 mm  anterolisthesis of the L4 vertebral body is seen on L5. Marked severity endplate sclerosis is seen at the level of L1-L2. Mild endplate sclerosis is noted throughout the remainder of the lumbar spine. Mild multilevel intervertebral disc space narrowing is noted IMPRESSION: Curvilinear area of cortical irregularity along the superior aspect of the L2 vertebral body. This may represent a nondisplaced fracture. Correlation with CT correlation is recommended. Electronically Signed   By: Virgina Norfolk M.D.   On: 07/22/2020 16:49   DG Hips Bilat W or Wo Pelvis 5 Views  Result Date: 07/22/2020 CLINICAL DATA:  Bilateral hip pain, history of metastatic breast cancer, fell EXAM: DG HIP (WITH OR WITHOUT PELVIS) 5+V BILAT COMPARISON:  06/26/2020 FINDINGS: Frontal view of the pelvis as well as frontal and frogleg lateral views of both hips are obtained. There are no acute displaced fractures. Alignment is anatomic. Mild symmetrical bilateral hip osteoarthritis is stable. Sclerosis of the bilateral sacroiliac joints, right greater than left, unchanged and consistent with sacroiliitis. Stable prominent lower lumbar spondylosis. Stable mottled sclerosis within the left inferior pubic ramus suspicious for metastatic disease. IMPRESSION: 1. No acute displaced fracture. Stable mild bilateral hip osteoarthritis. 2. Stable mottled sclerosis of the left inferior pubic ramus, suspicious for metastatic disease. 3. Stable lower lumbar spondylosis and bilateral sacroiliitis. Electronically Signed   By: Randa Ngo M.D.   On: 07/22/2020 16:47   VAS [place] LOWER EXTREMITY VENOUS (DVT) (ONLY [facility] & [facility])  Result  Date: 07/22/2020  Lower Venous DVTStudy Indications: Pain.  Limitations: Body habitus, poor ultrasound/tissue interface and patient position. Comparison Study: No prior study Performing Technologist: Maudry Mayhew MHA, RDMS, RVT, RDCS  Examination Guidelines: A complete evaluation includes B-mode imaging, spectral Doppler,  color Doppler, and power Doppler as needed of all accessible portions of each vessel. Bilateral testing is considered an integral part of a complete examination. Limited examinations for reoccurring indications may be performed as noted. The reflux portion of the exam is performed with the patient in reverse Trendelenburg.  +---------+--------------------+---------+-----------+----------+--------------+  RIGHT     Compressibility      Phasicity Spontaneity Properties Thrombus Aging  +---------+--------------------+---------+-----------+----------+--------------+  CFV       Full                 Yes       Yes                                    +---------+--------------------+---------+-----------+----------+--------------+  FV Prox   Full                                                                  +---------+--------------------+---------+-----------+----------+--------------+  FV Mid    Full                                                                  +---------+--------------------+---------+-----------+----------+--------------+  FV Distal Unable to compress             Yes                                               due to technical                                                                 limitations                                                           +---------+--------------------+---------+-----------+----------+--------------+  PFV       Full                                                                  +---------+--------------------+---------+-----------+----------+--------------+  POP  Full                 Yes       Yes                                    +---------+--------------------+---------+-----------+----------+--------------+  PTV       Full                           Yes                                    +---------+--------------------+---------+-----------+----------+--------------+  PERO      Full                           Yes                                     +---------+--------------------+---------+-----------+----------+--------------+   Right Technical Findings: Not visualized segments include SFJ.  Left Technical Findings: Not visualized segments include CFV.   Summary: RIGHT: - There is no evidence of deep vein thrombosis in the lower extremity. However, portions of this examination were limited- see technologist comments above.  - No cystic structure found in the popliteal fossa.   *See table(s) above for measurements and observations.    Preliminary     Procedures Procedures (including critical care time)  Medications Ordered in ED Medications  HYDROmorphone (DILAUDID) injection 1 mg (1 mg Intramuscular Given 07/22/20 1618)    ED Course  I have reviewed the triage vital signs and the nursing notes.  Pertinent labs & imaging results that were available during my care of the patient were reviewed by me and considered in my medical decision making (see chart for details).    MDM Rules/Calculators/A&P                         63:87 pm: 74 year old female with a history of metastatic breast cancer to the L-spine and pelvis presents with left and right-sided hip pain and pain in her right thigh which began to progressively get worse over the last few days. On presentation, she alert, oriented, nontoxic-appearing, no acute distress, speaking in full sentences no increased work of breathing.  Vitals on arrival overall reassuring, patient is afebrile not tachycardic or tachypneic on my exam.  Physical exam with tenderness to palpation to the midline L-spine, and paraspinal muscles along with SI joint tenderness.  Patient is moving all 4 extremities without difficulty and is able to push herself up on the bed as I examine her.  She does have some scattered bruises on her right thigh and some tenderness to palpation to the hamstrings.  Denies any chest pain or shortness of breath.  Patient and daughter at bedside are difficult historians and per chart  review patient has a history of not understanding her discharge instructions.  She is here stating that she thinks that there is something else going on with her hip will cause her to be bedbound.  Patient did receive radiation treatment in the hospital 2 weeks ago by Dr. Tammi Klippel however the patient is not followed up with him.  Given bruising and right  hamstring tenderness in the setting of cancer, ordered DVT study.  We will also order x-rays of her pelvis and L-spine.  Patient also received IM Dilaudid here in the ED.  Suspect that the patient will be stable for discharge with follow-up with Dr. Tammi Klippel.  4:56 PM: Plain films of the hip with no evidence of acute displaced fracture with stable sclerosis of the left inferior pubic ramus as seen on previous imaging consistent with metastatic disease.  Plain films of the L-spine comment on a cortical irregularity of the L2 vertebral body which may represent a nondisplaced fracture, however patient has a known mets to the L2 vertebral body per previous imaging.  Do not think she needs any emergent CT imaging of this at this time.  DVT study also negative.  I discussed these findings with the patient and her daughter who are overall reassured.  I stressed that they need to follow-up with Dr. Tammi Klippel and her palliative care doctors in order to improve her pain management.  Patient notes some improvement in her pain with IM Dilaudid.  Patient and daughter are both agreeable and comfortable with going home.  Strict return precautions discussed.  They voiced understanding and are agreeable.  At this stage in the ED course, the patient is medically screened and is stable for discharge.  Patient seen and evaluated by Dr. Tomi Bamberger who is agreeable to the above plan and disposition.  Final Clinical Impression(s) / ED Diagnoses Final diagnoses:  Bilateral hip pain    Rx / DC Orders ED Discharge Orders    None       Lyndel Safe 07/22/20 Domingo Mend, MD 07/22/20 [phone]

## 2020-07-22 NOTE — ED Triage Notes (Signed)
Patient reports pain 10/10 left leg, going around back down right leg. Patient says she is on palliative care and just had radiation.

## 2020-07-23 ENCOUNTER — Ambulatory Visit (INDEPENDENT_AMBULATORY_CARE_PROVIDER_SITE_OTHER): Payer: Medicare Other | Admitting: *Deleted

## 2020-07-23 DIAGNOSIS — I4891 Unspecified atrial fibrillation: Secondary | ICD-10-CM

## 2020-07-23 DIAGNOSIS — Z5181 Encounter for therapeutic drug level monitoring: Secondary | ICD-10-CM | POA: Diagnosis not present

## 2020-07-23 LAB — POCT INR: INR: 4.6 — AB (ref 2.0–3.0)

## 2020-07-23 NOTE — Patient Instructions (Signed)
Hold warfarin tonight then decrease dose to 1 1/2 tablets daily  Recheck in 1 week Orders given to [facility]

## 2020-07-26 ENCOUNTER — Telehealth: Payer: Self-pay | Admitting: Radiation Oncology

## 2020-07-26 NOTE — Telephone Encounter (Signed)
Phoned patient's home. Spoke with patient, son, and two sisters, Fraser Din and Izora Gala, via speaker phone. Reviewed that her oncology treatment team is at [facility] and she has had previous radiation to L1-2 there. Explained supicions that there has been a change at L2 based upon the symptoms she describes.  Encouraged they call for a follow up with Radiation Oncology at [facility] since they have the previous radiation plans and treatment dose related information that will guide treatment recommendation if indeed there is a need for further radiation to L2. Stressed we are more than happy to help in any way that we can but truly feel it would be in her best interest to follow up with her team at [facility] where she has a track record, especially since she is planning to continue her oncology care.   Answered all questions to the best of my ability. [name] verbalized that he reached out this morning to his mother's care team at [facility] and awaits there return call. Everyone verbalized understanding of all reviewed and expressed appreciation for the time spent addressing their concerns. Provided my direct number for future needs and questions.

## 2020-07-26 NOTE — Telephone Encounter (Signed)
Stacey [name], Her oncology treatment team is at [facility] and she has had previous radiation to L1-2 at [facility] so I would advise that they call for a follow up with her Rad Onc at [facility] since they have the previous radiation plans and treatment dose related information that will guide treatment recommendation if indeed there is a need for further radiation to this area. We are more than happy to help in any way that we can but I truly feel it would be in her best interest to follow up with her team at [facility] where she has a track record, especially since she is planning to continue her oncology care there.  Stacey [name], MMS, PA-C [place] [facility]: [phone]  Fax: [phone]

## 2020-07-26 NOTE — Telephone Encounter (Signed)
Per Freeman Caldron, PA-C request I phoned the patient to inquire about her pain yesterday around 1615. Patient was working with occupational therapy at the time I called. Patient's sister, Fraser Din, requested this RN phone back another time.   Phoned again this morning to inquire about her pain. Spoke with patient briefly. She reports constant low back pain that radiates down her thighs. She reports numbness and tingling in her legs. She confirms she is able to control her bowels and bladder. Patient became tearful when discussing her pain so her son, [name], took over the phone conversation.   [name] reports his mother has had low back pain for some time now even prior to Dr. Tammi Klippel radiating his mother's left hip. [name] reports approximately 1.5 weeks ago his mother slipped out of her chair when reaching for something. He goes onto explain her pain seems to be worse since that fall.   [name] explains when he isn't caring for his mother his aunts, Fraser Din and Izora Gala, are. [name] reports he gives his mother her medication. He endorses giving her methadone tid, pregabalin, oxycodone, lidoderm patch, tylenol and klonopin. He denies giving her percocet for break through pain. [name] confirms his mother tapered off decadron approximately a week ago.   Noted: Patient with a history of breast ca. Recently completed xrt to left hip. 8/22 imaging suggest irregularity at L2.   [name] understands this RN will inform the providers of these findings and phone him back with direction.

## 2020-08-03 ENCOUNTER — Telehealth: Payer: Self-pay | Admitting: Cardiology

## 2020-08-03 NOTE — Telephone Encounter (Signed)
[facility] would [facility] to discuss con't care   Please call Jennifer,NP [phone]    thanks

## 2020-08-03 NOTE — Telephone Encounter (Signed)
Spoke with Hal Morales, NP who would like to speak to the cardiologist.

## 2020-08-07 ENCOUNTER — Other Ambulatory Visit: Payer: Self-pay | Admitting: Cardiology

## 2020-08-07 MED ORDER — LOSARTAN POTASSIUM 100 MG PO TABS
100.0000 mg | ORAL_TABLET | Freq: Every day | ORAL | 1 refills | Status: DC
Start: 2020-08-07 — End: 2021-03-25

## 2020-08-07 NOTE — Telephone Encounter (Signed)
Medication pt was requesting was losartan 100 mg - Medication sent to pharmacy as requested

## 2020-08-07 NOTE — Telephone Encounter (Signed)
Dr. Harl Bowie I received a call from the cancer center at [facility] about this patient.  She apparently has an aggressive cancer and long term prognosis is ppor.  They want to place an intrathecal catheter for pain control.  This would require her to come off Coumadin and Plavix permanently if the catheter is placed.  I will contact Hal Morales, NP at [facility] with your recommendation. Her number is [phone].  Thanks  Kerin Ransom PA-C 08/07/2020 10:11 AM

## 2020-08-07 NOTE — Telephone Encounter (Signed)
Stacey [name] from [facility] pain clinic requesting to stop plavix and coumadin for intrathecal pump for pain and her cancer - seen Edrick Oh, RN 8/23

## 2020-08-07 NOTE — Telephone Encounter (Signed)
° ° °  1. Which medications need to be refilled? (please list name of each medication and dose if known) Potassium 100 mg   2. Which pharmacy/location (including street and city if local pharmacy) is medication to be sent to? [place], [place]   3. Do they need a 30 day or 90 day supply?    Patient is out of medication.

## 2020-08-08 ENCOUNTER — Telehealth: Payer: Self-pay | Admitting: *Deleted

## 2020-08-08 ENCOUNTER — Telehealth: Payer: Self-pay

## 2020-08-08 ENCOUNTER — Ambulatory Visit (INDEPENDENT_AMBULATORY_CARE_PROVIDER_SITE_OTHER): Payer: Medicare Other | Admitting: Pharmacist

## 2020-08-08 ENCOUNTER — Telehealth: Payer: Self-pay | Admitting: Cardiology

## 2020-08-08 DIAGNOSIS — Z5181 Encounter for therapeutic drug level monitoring: Secondary | ICD-10-CM

## 2020-08-08 DIAGNOSIS — I4891 Unspecified atrial fibrillation: Secondary | ICD-10-CM | POA: Diagnosis not present

## 2020-08-08 LAB — POCT INR: INR: 8.6 — AB (ref 2.0–3.0)

## 2020-08-08 NOTE — Telephone Encounter (Signed)
Spoke with Inez Catalina, [facility] nurse.  See anticoagulation encounter.  Inez Catalina #  [phone]

## 2020-08-08 NOTE — Telephone Encounter (Signed)
Betsy PA at [facility] called wanting to know if its okay if patient comes off of anticoagulant if a pain pump is placed.

## 2020-08-08 NOTE — Patient Instructions (Addendum)
Description   Patient has already had warfarin today.  Hold dose tomorrow, Friday, Saturday, and Sunday.  Recheck INR Monday.  Spoke with  [place] #  [phone]

## 2020-08-08 NOTE — Telephone Encounter (Signed)
Stacey [name] from [facility] home health reporting pt INR 8.6 Stacey [name] #  [phone]

## 2020-08-08 NOTE — Telephone Encounter (Signed)
Error

## 2020-08-08 NOTE — Telephone Encounter (Signed)
Contacted by [facility] palliative care regarding possible intrathecal pain pump insertion for patient given chronic debilitiating back pain from her metastatic breast cancer and spinal stenosis that has failed oral therapy. Patient would need to come off coumadin and plavix for procedure and remain off while pump in place. Im told she could be on aspirin  Patient with history of afib on coumadin, CAD with prior stents and late stent thrombosis on plavix. I discussed with patient decision really hinges on level of her chronic pain and quality of life and level of risk she is willing to tolerate to try and improve that. She reports chronic 10/10 pain daily without relief. She wishes to talk with her family and other doctors before making decision.    Carlyle Dolly MD

## 2020-08-10 NOTE — Telephone Encounter (Signed)
I discussed with [facility] and the patient. SHe is thinking about risk vs benefit of procedure and being off meds and will notify us  Carlyle Dolly MD

## 2020-08-16 NOTE — Telephone Encounter (Signed)
See new note for details

## 2020-08-21 NOTE — Telephone Encounter (Signed)
Removed from pool- pending patient decision on surgery.  Kerin Ransom PA-C 08/21/2020 8:24 AM

## 2020-08-22 ENCOUNTER — Telehealth: Payer: Self-pay | Admitting: Cardiology

## 2020-08-22 NOTE — Telephone Encounter (Signed)
[name] with [facility] informed of the following previous message from Dr. Harl Bowie and verbalized understanding:  [place], [place], [place]     07/18/20 2:45 PM Note Sharyn Lull aware that we will no longer be doing home health orders for this pt as we did so in the past as a courtesy with the impression that this pt would be establishing with pcp

## 2020-08-22 NOTE — Telephone Encounter (Signed)
New message    Suezanne Jacquet with [facility] called to get verbal order to resume care for patient , she was just released from having GI  Bleed   1 week 1  2 week 1  Home health aide also need order for the same order to resume care

## 2020-08-23 ENCOUNTER — Telehealth: Payer: Self-pay | Admitting: *Deleted

## 2020-08-23 ENCOUNTER — Telehealth (INDEPENDENT_AMBULATORY_CARE_PROVIDER_SITE_OTHER): Payer: Self-pay | Admitting: *Deleted

## 2020-08-23 ENCOUNTER — Other Ambulatory Visit (INDEPENDENT_AMBULATORY_CARE_PROVIDER_SITE_OTHER): Payer: Self-pay | Admitting: *Deleted

## 2020-08-23 ENCOUNTER — Other Ambulatory Visit: Payer: Self-pay

## 2020-08-23 ENCOUNTER — Encounter (INDEPENDENT_AMBULATORY_CARE_PROVIDER_SITE_OTHER): Payer: Self-pay | Admitting: Gastroenterology

## 2020-08-23 ENCOUNTER — Encounter (INDEPENDENT_AMBULATORY_CARE_PROVIDER_SITE_OTHER): Payer: Self-pay | Admitting: *Deleted

## 2020-08-23 ENCOUNTER — Ambulatory Visit (INDEPENDENT_AMBULATORY_CARE_PROVIDER_SITE_OTHER): Payer: Medicare Other | Admitting: Gastroenterology

## 2020-08-23 DIAGNOSIS — K625 Hemorrhage of anus and rectum: Secondary | ICD-10-CM

## 2020-08-23 DIAGNOSIS — R791 Abnormal coagulation profile: Secondary | ICD-10-CM | POA: Insufficient documentation

## 2020-08-23 NOTE — Patient Instructions (Signed)
Schedule colonoscopy - will need clearance for Dr. Harl Bowie (cardiology) and also request to stop Plavix 5 days before procedure (OK to be on baby aspirin for it)

## 2020-08-23 NOTE — Telephone Encounter (Signed)
Home health would need to be coordinated with a pcp, this is not part of our practice. Was done as a courtesy last time intended for 2-3 weeks, but this is why we don't typically even do it as a courtesy because requests keep coming back to [place] event months later.    Zandra Abts MD

## 2020-08-23 NOTE — Telephone Encounter (Signed)
Surrency @ Dr Colman Cater office pt has appt with Dr Harl Bowie 08-31-2020

## 2020-08-23 NOTE — Telephone Encounter (Signed)
Noted.  Pt is pending colonoscopy by DR Laural Golden after clearance.

## 2020-08-23 NOTE — Telephone Encounter (Signed)
08/23/20  [place] 12-Sep-1946   Request for pharmacy/medical clearance to hold medication prior to procedure:   . Procedure to be performed? colonoscopy  . Procedure Date: 09/14/20   . Medications that need to be held prior to procedure and how long?   Plavix 5 days prior   . Name of physician performing procedure? Dr Jenetta Downer   . Anesthesia type? Monitored (propofol)

## 2020-08-23 NOTE — Telephone Encounter (Signed)
Per phone call from [facility] -- called stating pt is currently not taking her coumadin due to a GI Bleed and they stopped her coumadin.   Inez Catalina wanted to make sure you were aware, she can be reached @ [phone]

## 2020-08-23 NOTE — Progress Notes (Signed)
Maylon Peppers, M.D. Gastroenterology & Hepatology [facility] [address] [place], [place] [address] Primary Care Physician: Sela Hilding [place] [address]  Referring MD: PCP  I will communicate my assessment and recommendations to the referring MD via EMR. Note: Occasional unusual wording and randomly placed punctuation marks may result from the use of speech recognition technology to transcribe this document"  Chief Complaint: Rectal bleeding  History of Present Illness: Stacey [name] is a 74 y.o. 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  Patient reports that on 08/12/2020 she presented new onset painless rectal bleeding when wiping but also on top of her stool without diarrhea.  She reported she has never had this in the past and was concerned about it so she decided to go to the ER at [facility].  In there, the patient was found to have a supratherapeutic INR of 6.16, was found to have mild anemia of 11.2 with normal platelet count of 265.  Due to this, the patient was given plasma to reverse her anticoagulation.  Underwent a CT of the abdomen with IV contrast that showed acute compression fracture of the L1 vertebral body along with multilevel degenerative changes in her spine.  The patient was subsequently transferred to [place] as there were no gastroenterologist available in the other location.  Based on her notes, the patient had hemoglobin fluctuating between 8 and 11 but did not have any more episodes of rectal bleeding after she received the plasma.  Since the patient was having significant pain in her back, colonoscopy was  deferred as she "would not be able to tolerate taking the bowel prep".  The patient was restarted on her Plavix but she was discharged off Coumadin and has not restarted the medication since then.  Currently, the patient states that she is still having significant pain in her back but denies having any other complaint.  She reports that she has not had any more bloody bowel movements and denies having any abdominal pain, nausea, vomiting, fever, chills, melena, hematemesis, abdominal distention, abdominal pain, diarrhea, jaundice, pruritus.  She reports that her appetite has changed after she has implemented keto diet which has led to some weight loss of a few pounds.  I personally reviewed all her medical records from the outside facility.  Last EGD: Never Last Colonoscopy: Never  FHx: Sister has Crohn's disease, otherwise neg for any gastrointestinal/liver disease, no malignancies Social: neg smoking, alcohol or illicit drug use Surgical: non contributory  Past Medical History: Past Medical History:  Diagnosis Date  . CAD in native artery    a. DES to ramus 2005 with late stent thrombosis 2006 tx with PTCA. 12/18 PCI/DES x1 to mRCA, EF 50-55%  . CHF (congestive heart failure) ([place])   . Chronic pain   . COPD (chronic obstructive pulmonary disease) ([place])   . Hyperlipidemia   . Hypertension   . Left bundle branch block   . Lymphedema   . Metastatic breast cancer ([place])    a. to bone.  . MI (myocardial infarction) ([place])   . Mild aortic stenosis 10/2017  . Morbid obesity ([place])   . PAF (paroxysmal atrial fibrillation) ([place])   . PSVT (paroxysmal supraventricular tachycardia) ([place])  a. per [facility] notes, seen on event monitor in 2014.  . Pulmonary nodules     Past Surgical History: Past Surgical History:  Procedure Laterality Date  . CORONARY STENT INTERVENTION N/A 11/26/2017   Procedure: CORONARY STENT INTERVENTION;  Surgeon: Martinique, Peter M, MD;  Location: [facility];   Service: Cardiovascular;  Laterality: N/A;  . CORONARY STENT PLACEMENT    . LEFT HEART CATH AND CORONARY ANGIOGRAPHY N/A 11/26/2017   Procedure: LEFT HEART CATH AND CORONARY ANGIOGRAPHY;  Surgeon: Martinique, Peter M, MD;  Location: [facility];  Service: Cardiovascular;  Laterality: N/A;    Family History: Family History  Problem Relation Age of Onset  . CAD Father 36  . Heart attack Father   . COPD Sister   . CAD Paternal Grandmother   . Sudden Cardiac Death Neg Hx     Social History: Social History   Tobacco Use  Smoking Status Former Smoker  . Packs/day: 2.00  . Years: 18.00  . Pack years: 36.00  . Types: Cigarettes  . Quit date: 12/01/1980  . Years since quitting: 39.7  Smokeless Tobacco Never Used   Social History   Substance and Sexual Activity  Alcohol Use No   Social History   Substance and Sexual Activity  Drug Use Yes  . Types: Oxycodone   Comment: takes methadone    Allergies: Allergies  Allergen Reactions  . Brilinta [Ticagrelor] Other (See Comments)    Nausea and severe diarrhea- general weakness. Patient says does not want to take again  . Albuterol Other (See Comments)    Heart racing     Medications: Current Outpatient Medications  Medication Sig Dispense Refill  . acetaminophen (TYLENOL) 325 MG tablet Take 2 tablets (650 mg total) by mouth every 8 (eight) hours. 30 tablet 0  . atorvastatin (LIPITOR) 80 MG tablet Take 1 tablet (80 mg total) by mouth daily. 90 tablet 2  . b complex vitamins tablet Take 1 tablet by mouth every morning. 30 tablet 0  . budesonide (PULMICORT) 0.25 MG/2ML nebulizer solution Take 0.25 mg by nebulization 2 (two) times daily.    . Calcium Citrate-Vitamin D (CITRACAL MAXIMUM) 315-250 MG-UNIT TABS Take 2 tablets by mouth daily.     . carboxymethylcellulose (REFRESH PLUS) 0.5 % SOLN Place 1 drop into both eyes in the morning, at noon, in the evening, and at bedtime.    . clonazePAM (KLONOPIN) 1 MG disintegrating  tablet Take 1 tablet (1 mg total) by mouth 3 (three) times daily as needed (anxiety). 10 tablet 0  . clopidogrel (PLAVIX) 75 MG tablet Take 1 tablet (75 mg total) by mouth daily. 90 tablet 2  . dexamethasone (DECADRON) 2 MG tablet Take 2 mg by mouth daily.    . DULoxetine (CYMBALTA) 30 MG capsule Take 3 capsules (90 mg total) by mouth daily. 30 capsule 0  . DULoxetine (CYMBALTA) 60 MG capsule Take 60 mg by mouth daily.    . Lidocaine HCl-Benzyl Alcohol (SALONPAS LIDOCAINE PLUS EX) Apply 1 patch topically daily. Applied to back and/or right side buttock area    . losartan (COZAAR) 100 MG tablet Take 1 tablet (100 mg total) by mouth daily. 90 tablet 1  . methadone (DOLOPHINE) 5 MG tablet Take 1 tablet (5 mg total) by mouth 4 (four) times daily. 30 tablet 0  . metoprolol succinate (TOPROL-XL) 100 MG 24 hr tablet TAKE 1 AND 1/2 TABLETS DAILY (Patient taking differently: Take 150 mg by mouth daily. ) 135 tablet 2  .  Multiple Vitamins-Minerals (EQ VISION FORMULA 50+ PO) Take 1 tablet by mouth every morning.    . nitroGLYCERIN (NITROSTAT) 0.4 MG SL tablet Place 1 tablet (0.4 mg total) under the tongue every 5 (five) minutes x 3 doses as needed for chest pain (if no relief after 3rd dose, proceed to the ED or call 911). 25 tablet 3  . oxycodone (OXY-IR) 5 MG capsule Take 10 mg by mouth every 3 (three) hours as needed for pain.     . pantoprazole (PROTONIX) 40 MG tablet Take 40 mg by mouth daily.    . polyethylene glycol powder (GLYCOLAX/MIRALAX) 17 GM/SCOOP powder Take 17 g by mouth daily. Mixed in 4-8 ounces of liquid    . pregabalin (LYRICA) 150 MG capsule Take 150 mg by mouth 2 (two) times daily.     Marland Kitchen senna-docusate (SENOKOT-S) 8.6-50 MG tablet Take 2 tablets by mouth in the morning and at bedtime.    Marland Kitchen spironolactone (ALDACTONE) 25 MG tablet TAKE 1/2 TABLET(12.5 MG) BY MOUTH DAILY (Patient taking differently: Take 12.5 mg by mouth daily. ) 45 tablet 2  . lidocaine (LIDODERM) 5 % Place 2 patches onto  the skin daily. Remove & Discard patch within 12 hours or as directed by MD (Patient not taking: Reported on 08/23/2020) 30 patch 0   No current facility-administered medications for this visit.    Review of Systems: GENERAL: negative for malaise, night sweats HEENT: No changes in hearing or vision, no nose bleeds or other nasal problems. NECK: Negative for lumps, goiter, pain and significant neck swelling RESPIRATORY: Negative for cough, wheezing CARDIOVASCULAR: Negative for chest pain, leg swelling, palpitations, orthopnea GI: SEE HPI MUSCULOSKELETAL: Negative for joint pain or swelling, back pain, and muscle pain. SKIN: Negative for lesions, rash PSYCH: Negative for sleep disturbance, mood disorder and recent psychosocial stressors. HEMATOLOGY Negative for prolonged bleeding, bruising easily, and swollen nodes. ENDOCRINE: Negative for cold or heat intolerance, polyuria, polydipsia and goiter. NEURO: negative for tremor, gait imbalance, syncope and seizures. The remainder of the review of systems is noncontributory.   Physical Exam: BP 118/60 (BP Location: Left Arm, Patient Position: Sitting, Cuff Size: Normal)   Pulse 92   Temp 98.8 F (37.1 C) (Oral)   Ht 5' (1.524 m)   Wt 194 lb 4.8 oz (88.1 kg)   BMI 37.95 kg/m  GENERAL: The patient is AO x3, in mild distress due to pain.  Sitting in wheelchair. HEENT: Head is normocephalic and atraumatic. EOMI are intact. Mouth is well hydrated and without lesions. NECK: Supple. No masses LUNGS: Clear to auscultation. No presence of rhonchi/wheezing/rales. Adequate chest expansion HEART: RRR, normal s1 and s2. ABDOMEN: Soft, nontender, no guarding, no peritoneal signs, and nondistended. BS +. No masses. EXTREMITIES: Without any cyanosis, clubbing, rash, lesions or edema. NEUROLOGIC: AOx3, no focal motor deficit. SKIN: no jaundice, no rashes   Imaging/Labs: as above  I personally reviewed and interpreted the available labs, imaging  and endoscopic files.  Impression and Plan: Stacey [name] is a 74 y.o. 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  The patient had an episode of supratherapeutic anticoagulation leading to a recent admission for rectal bleeding which resolved after the patient received frozen plasma.  Fortunately, she did not require to be transfused.  It is unknown at  this point the etiology of her bleeding but this could be related to an AVM versus diverticular bleeding versus possible malignancy, which will need to be further evaluated with a colonoscopy as she has never had one in the past.  It would be important for [place] to perform this procedure of Plavix, which will need to be addressed with her cardiologist she will need to hold the medication 5 days prior.  If INR antiplatelet such as aspirin is indicated in the interim, it would not be a contraindication for her colonoscopy.  The patient understood and agreed.  - Schedule colonoscopy - will need clearance for Dr. Harl Bowie (cardiology) and also request to stop Plavix 5 days before procedure (OK to be on aspirin 81 mg for it)  All questions were answered.      Maylon Peppers, MD Gastroenterology and Hepatology [facility]

## 2020-08-31 ENCOUNTER — Ambulatory Visit (INDEPENDENT_AMBULATORY_CARE_PROVIDER_SITE_OTHER): Payer: Medicare Other | Admitting: Cardiology

## 2020-08-31 ENCOUNTER — Encounter: Payer: Self-pay | Admitting: Cardiology

## 2020-08-31 ENCOUNTER — Encounter: Payer: Self-pay | Admitting: *Deleted

## 2020-08-31 VITALS — BP 118/74 | HR 74 | Ht 60.0 in | Wt 195.0 lb

## 2020-08-31 DIAGNOSIS — I5022 Chronic systolic (congestive) heart failure: Secondary | ICD-10-CM

## 2020-08-31 DIAGNOSIS — I480 Paroxysmal atrial fibrillation: Secondary | ICD-10-CM | POA: Diagnosis not present

## 2020-08-31 DIAGNOSIS — I2510 Atherosclerotic heart disease of native coronary artery without angina pectoris: Secondary | ICD-10-CM

## 2020-08-31 NOTE — Progress Notes (Signed)
Clinical Summary Ms. Blocher is a 74 y.o.female seen today for follow up of the following medical problems.   1. Chronic systolic HF/ICM - Jan 8850 echo LVEF 20-25%, restrictive diastolic function, mild AS, PASP 40  - 10/2017 echo LVEF 50%, dropped to 30-35% Jan 2019 and has remained decreased  - chronic left leg edema thought to be venous insufficiency  From prior cardiologist: "She is not a candidate for device therapy given her metastatic cancer and numerous comorbidities and poor long-term prognosis. She has been scheduled to establish care with the palliative care team which I think is quite appropriate". - has been hesitant to change to entresto due to concern about cost    - has not required diuretic recently, no significant issues with volume overload.    2. CAD - notesincidate history of DES to ramus, late stentthrombosis requiring PTCA in 2006.DES to RCA in Dec 2018 - SOB on brillinta, previously changed to plavix.Has been on plavix and coumadin.  - no chest pains   3. Mild aortic stenosis - Jan 2020 echo mean grad 11, AVA VTI 1.69  4. Chronic LBBB   5. . Metastatic breast cancer - followed at [facility]. Metastatic breast cancer to the bone and renal lesion, has pulmonary nodules. On radiation. - has been seen by palliative care previously - from 03/08/20 onc note increased size of renal lesion, to f/u with urology. Mention concern for additional primary such as renal cell CA.    6. Afib - noted during Jan 2020 admission, new diagnosis - managed with Toprol, started on eliquis.NO evidence of brain mets by 10/2018 CT.  - has not been interested in NOACs due to cost - infrequent palpitations.   7. GI bleed - [facility] to [facility], we are awaiting records - coumadin was stopped - from GI notes INR was 6.16. Hgbs 8 to 11 during admit. Did not require transfusion - INR was reversed, bleeding resolved.   -needs to be off plavix for  colonscopy. Could be on ASA  - Schedule colonoscopy - will need clearance for Dr. Harl Bowie (cardiology) and also request to stop Plavix 5 days before procedure (OK to be on aspirin 81 mg for it)    Past Medical History:  Diagnosis Date  . CAD in native artery    a. DES to ramus 2005 with late stent thrombosis 2006 tx with PTCA. 12/18 PCI/DES x1 to mRCA, EF 50-55%  . CHF (congestive heart failure) ([place])   . Chronic pain   . COPD (chronic obstructive pulmonary disease) ([place])   . Hyperlipidemia   . Hypertension   . Left bundle Levert Heslop block   . Lymphedema   . Metastatic breast cancer ([place])    a. to bone.  . MI (myocardial infarction) ([place])   . Mild aortic stenosis 10/2017  . Morbid obesity ([place])   . PAF (paroxysmal atrial fibrillation) ([place])   . PSVT (paroxysmal supraventricular tachycardia) ([place])    a. per [facility] notes, seen on event monitor in 2014.  . Pulmonary nodules      Allergies  Allergen Reactions  . Brilinta [Ticagrelor] Other (See Comments)    Nausea and severe diarrhea- general weakness. Patient says does not want to take again  . Albuterol Other (See Comments)    Heart racing      Current Outpatient Medications  Medication Sig Dispense Refill  . acetaminophen (TYLENOL) 325 MG tablet Take 2 tablets (650 mg total) by mouth every 8 (eight) hours. 30 tablet  0  . atorvastatin (LIPITOR) 80 MG tablet Take 1 tablet (80 mg total) by mouth daily. 90 tablet 2  . b complex vitamins tablet Take 1 tablet by mouth every morning. 30 tablet 0  . budesonide (PULMICORT) 0.25 MG/2ML nebulizer solution Take 0.25 mg by nebulization 2 (two) times daily.    . Calcium Citrate-Vitamin D (CITRACAL MAXIMUM) 315-250 MG-UNIT TABS Take 2 tablets by mouth daily.     . carboxymethylcellulose (REFRESH PLUS) 0.5 % SOLN Place 1 drop into both eyes in the morning, at noon, in the evening, and at bedtime.    . clonazePAM (KLONOPIN) 1 MG disintegrating tablet Take 1 tablet (1 mg total) by mouth 3  (three) times daily as needed (anxiety). 10 tablet 0  . clopidogrel (PLAVIX) 75 MG tablet Take 1 tablet (75 mg total) by mouth daily. 90 tablet 2  . dexamethasone (DECADRON) 2 MG tablet Take 2 mg by mouth daily.    . DULoxetine (CYMBALTA) 30 MG capsule Take 3 capsules (90 mg total) by mouth daily. 30 capsule 0  . DULoxetine (CYMBALTA) 60 MG capsule Take 60 mg by mouth daily.    Marland Kitchen lidocaine (LIDODERM) 5 % Place 2 patches onto the skin daily. Remove & Discard patch within 12 hours or as directed by MD (Patient not taking: Reported on 08/23/2020) 30 patch 0  . Lidocaine HCl-Benzyl Alcohol (SALONPAS LIDOCAINE PLUS EX) Apply 1 patch topically daily. Applied to back and/or right side buttock area    . losartan (COZAAR) 100 MG tablet Take 1 tablet (100 mg total) by mouth daily. 90 tablet 1  . methadone (DOLOPHINE) 5 MG tablet Take 1 tablet (5 mg total) by mouth 4 (four) times daily. 30 tablet 0  . metoprolol succinate (TOPROL-XL) 100 MG 24 hr tablet TAKE 1 AND 1/2 TABLETS DAILY (Patient taking differently: Take 150 mg by mouth daily. ) 135 tablet 2  . Multiple Vitamins-Minerals (EQ VISION FORMULA 50+ PO) Take 1 tablet by mouth every morning.    . nitroGLYCERIN (NITROSTAT) 0.4 MG SL tablet Place 1 tablet (0.4 mg total) under the tongue every 5 (five) minutes x 3 doses as needed for chest pain (if no relief after 3rd dose, proceed to the ED or call 911). 25 tablet 3  . oxycodone (OXY-IR) 5 MG capsule Take 10 mg by mouth every 3 (three) hours as needed for pain.     . pantoprazole (PROTONIX) 40 MG tablet Take 40 mg by mouth daily.    . polyethylene glycol powder (GLYCOLAX/MIRALAX) 17 GM/SCOOP powder Take 17 g by mouth daily. Mixed in 4-8 ounces of liquid    . pregabalin (LYRICA) 150 MG capsule Take 150 mg by mouth 2 (two) times daily.     Marland Kitchen senna-docusate (SENOKOT-S) 8.6-50 MG tablet Take 2 tablets by mouth in the morning and at bedtime.    Marland Kitchen spironolactone (ALDACTONE) 25 MG tablet TAKE 1/2 TABLET(12.5 MG) BY  MOUTH DAILY (Patient taking differently: Take 12.5 mg by mouth daily. ) 45 tablet 2   No current facility-administered medications for this visit.     Past Surgical History:  Procedure Laterality Date  . CORONARY STENT INTERVENTION N/A 11/26/2017   Procedure: CORONARY STENT INTERVENTION;  Surgeon: Martinique, Peter M, MD;  Location: [facility];  Service: Cardiovascular;  Laterality: N/A;  . CORONARY STENT PLACEMENT    . LEFT HEART CATH AND CORONARY ANGIOGRAPHY N/A 11/26/2017   Procedure: LEFT HEART CATH AND CORONARY ANGIOGRAPHY;  Surgeon: Martinique, Peter M, MD;  Location:  [facility];  Service: Cardiovascular;  Laterality: N/A;     Allergies  Allergen Reactions  . Brilinta [Ticagrelor] Other (See Comments)    Nausea and severe diarrhea- general weakness. Patient says does not want to take again  . Albuterol Other (See Comments)    Heart racing       Family History  Problem Relation Age of Onset  . CAD Father 73  . Heart attack Father   . COPD Sister   . CAD Paternal Grandmother   . Sudden Cardiac Death Neg Hx      Social History Ms. Lafoy reports that she quit smoking about 39 years ago. Her smoking use included cigarettes. She has a 36.00 pack-year smoking history. She has never used smokeless tobacco. Ms. Wachtel reports no history of alcohol use.   Review of Systems CONSTITUTIONAL: No weight loss, fever, chills, weakness or fatigue.  HEENT: Eyes: No visual loss, blurred vision, double vision or yellow sclerae.No hearing loss, sneezing, congestion, runny nose or sore throat.  SKIN: No rash or itching.  CARDIOVASCULAR: per hpi RESPIRATORY: No shortness of breath, cough or sputum.  GASTROINTESTINAL: No anorexia, nausea, vomiting or diarrhea. No abdominal pain or blood.  GENITOURINARY: No burning on urination, no polyuria NEUROLOGICAL: No headache, dizziness, syncope, paralysis, ataxia, numbness or tingling in the extremities. No change in bowel or bladder  control.  MUSCULOSKELETAL: No muscle, back pain, joint pain or stiffness.  LYMPHATICS: No enlarged nodes. No history of splenectomy.  PSYCHIATRIC: No history of depression or anxiety.  ENDOCRINOLOGIC: No reports of sweating, cold or heat intolerance. No polyuria or polydipsia.  Marland Kitchen   Physical Examination Today's Vitals   08/31/20 1059  BP: 118/74  Pulse: 74  SpO2: 98%  Weight: 195 lb (88.5 kg)  Height: 5' (1.524 m)   Body mass index is 38.08 kg/m.  Gen: resting comfortably, no acute distress HEENT: no scleral icterus, pupils equal round and reactive, no palptable cervical adenopathy,  CV: RRR, no m/r/g, noj vd Resp: Clear to auscultation bilaterally GI: abdomen is soft, non-tender, non-distended, normal bowel sounds, no hepatosplenomegaly MSK: extremities are warm, no edema.  Skin: warm, no rash Neuro:  no focal deficits Psych: appropriate affect   Diagnostic Studies 10/2017 cath  Ost Ramus to Ramus lesion is 100% stenosed.  Mid RCA lesion is 90% stenosed.  A drug-eluting stent was successfully placed using a STENT SYNERGY DES 2.5X16.  Post intervention, there is a 0% residual stenosis.  LV end diastolic pressure is normal.  1. 2 vessel obstructive CAD - 100% proximal ramus intermediate at site of prior stent. I think this is a CTO and she has good collaterals.  - 90% ulcerative mid RCA 2. Normal LVEDP 3. Successful PCI of the mid RCA with DES   10/2017 echo Study Conclusions  - Left ventricle: The cavity size was normal. There was mild concentric hypertrophy. Systolic function was normal. The estimated ejection fraction was in the range of 50% to 55%. There was an increased relative contribution of atrial contraction to ventricular filling. Doppler parameters are consistent with abnormal left ventricular relaxation (grade 1 diastolic dysfunction). - Aortic valve: There was mild stenosis. Valve area (VTI): 1.66 cm^2. Valve area  (Vmax): 1.71 cm^2. Valve area (Vmean): 1.62 cm^2. - Mitral valve: Calcified annulus. Mildly thickened leaflets . - Pulmonary arteries: PA peak pressure: 32 mm Hg (S).  Jan 2019 echo Study Conclusions  - Left ventricle: The cavity size was normal. There was mild concentric hypertrophy. Systolic function  was moderately to severely reduced. The estimated ejection fraction was in the range of 30% to 35%. Diffuse hypokinesis. Doppler parameters are consistent with abnormal left ventricular relaxation (grade 1 diastolic dysfunction). - Aortic valve: Probably trileaflet; moderately thickened, severely calcified leaflets. There was mild stenosis. Peak gradient (S): 13 mm Hg. Valve area (Vmax): 1.26 cm^2. - Mitral valve: Calcified annulus. Mildly thickened leaflets . There was mild regurgitation. - Right ventricle: The cavity size was normal. Wall thickness was normal. Systolic function was normal. - Right atrium: The atrium was normal in size. - Pulmonary arteries: Systolic pressure could not be accurately estimated. - Inferior vena cava: The vessel was normal in size. - Pericardium, extracardiac: There was no pericardial effusion.  Impressions:  - Since the last study on 11/24/17 LVEF has decreased from 50% to 30-35% with diffuse hypokinesis.  Jan 2020 echo Study Conclusions  - Left ventricle: The cavity size was normal. Wall thickness was normal. Systolic function was severely reduced. The estimated ejection fraction was in the range of 20% to 25%. Diffuse hypokinesis. Doppler parameters are consistent with restrictive physiology, indicative of decreased left ventricular diastolic compliance and/or increased left atrial pressure. Doppler parameters are consistent with high ventricular filling pressure. - Aortic valve: There was mild stenosis. Valve area (VTI): 1.69 cm^2. Valve area (Vmax): 1.73 cm^2. Valve area (Vmean): 1.74 cm^2. -  Mitral valve: Calcified annulus. There was mild regurgitation. - Left atrium: The atrium was mildly dilated. - Atrial septum: No defect or patent foramen ovale was identified. - Tricuspid valve: There was mild regurgitation. - Pulmonary arteries: PA peak pressure: 40 mm Hg (S).    Assessment and Plan  1.Chronic systolic HF -no symptoms, continue current meds  2. CAD - on coumadin and plavix due to afib and history of late stent thrombosis - no recent chest pains, continue current meds - needs to be off plavix for upcoming EGD, will change to ASA.   3. PAF - DOACs too expensive,she was changed to coumadin - some recent palpitations, increase toprol to 150 mg daily      Arnoldo Lenis, M.D.

## 2020-08-31 NOTE — Patient Instructions (Addendum)
Your physician recommends that you schedule a follow-up appointment 10/18 @ [facility] has recommended you make the following change in your medication:   STOP PLAVIX  START ASPIRIN 81 MG DAILY   Thank you for choosing [facility]!!

## 2020-09-05 ENCOUNTER — Telehealth (INDEPENDENT_AMBULATORY_CARE_PROVIDER_SITE_OTHER): Payer: Self-pay | Admitting: *Deleted

## 2020-09-05 ENCOUNTER — Emergency Department (HOSPITAL_COMMUNITY)
Admission: EM | Admit: 2020-09-05 | Discharge: 2020-09-05 | Disposition: A | Discharge status: Home / Self Care | Payer: Medicare Other | Attending: Emergency Medicine | Admitting: Emergency Medicine

## 2020-09-05 ENCOUNTER — Encounter (HOSPITAL_COMMUNITY): Payer: Self-pay

## 2020-09-05 ENCOUNTER — Emergency Department (HOSPITAL_COMMUNITY): Payer: Medicare Other

## 2020-09-05 ENCOUNTER — Other Ambulatory Visit: Payer: Self-pay

## 2020-09-05 DIAGNOSIS — Z7982 Long term (current) use of aspirin: Secondary | ICD-10-CM | POA: Insufficient documentation

## 2020-09-05 DIAGNOSIS — G8929 Other chronic pain: Secondary | ICD-10-CM | POA: Diagnosis not present

## 2020-09-05 DIAGNOSIS — M545 Low back pain, unspecified: Secondary | ICD-10-CM | POA: Diagnosis not present

## 2020-09-05 DIAGNOSIS — Z79899 Other long term (current) drug therapy: Secondary | ICD-10-CM | POA: Diagnosis not present

## 2020-09-05 DIAGNOSIS — Z87891 Personal history of nicotine dependence: Secondary | ICD-10-CM | POA: Diagnosis not present

## 2020-09-05 DIAGNOSIS — Z7901 Long term (current) use of anticoagulants: Secondary | ICD-10-CM | POA: Insufficient documentation

## 2020-09-05 DIAGNOSIS — J449 Chronic obstructive pulmonary disease, unspecified: Secondary | ICD-10-CM | POA: Insufficient documentation

## 2020-09-05 DIAGNOSIS — I119 Hypertensive heart disease without heart failure: Secondary | ICD-10-CM | POA: Insufficient documentation

## 2020-09-05 DIAGNOSIS — C7951 Secondary malignant neoplasm of bone: Secondary | ICD-10-CM | POA: Insufficient documentation

## 2020-09-05 LAB — BASIC METABOLIC PANEL
Anion gap: 6 (ref 5–15)
BUN: 14 mg/dL (ref 8–23)
CO2: 31 mmol/L (ref 22–32)
Calcium: 8.5 mg/dL — ABNORMAL LOW (ref 8.9–10.3)
Chloride: 102 mmol/L (ref 98–111)
Creatinine, Ser: 0.56 mg/dL (ref 0.44–1.00)
GFR calc non Af Amer: >60 mL/min (ref >60)
Glucose, Bld: 105 mg/dL — ABNORMAL HIGH (ref 70–99)
Potassium: 3.9 mmol/L (ref 3.5–5.1)
Sodium: 139 mmol/L (ref 135–145)

## 2020-09-05 LAB — CBC WITH DIFFERENTIAL/PLATELET
Abs Immature Granulocytes: 0.02 10*3/uL (ref 0.00–0.07)
Basophils Absolute: 0 10*3/uL (ref 0.0–0.1)
Basophils Relative: 1 %
Eosinophils Absolute: 0.1 10*3/uL (ref 0.0–0.5)
Eosinophils Relative: 3 %
HCT: 36.2 % (ref 36.0–46.0)
Hemoglobin: 11.4 g/dL — ABNORMAL LOW (ref 12.0–15.0)
Immature Granulocytes: 1 %
Lymphocytes Relative: 20 %
Lymphs Abs: 0.7 10*3/uL (ref 0.7–4.0)
MCH: 31.9 pg (ref 26.0–34.0)
MCHC: 31.5 g/dL (ref 30.0–36.0)
MCV: 101.4 fL — ABNORMAL HIGH (ref 80.0–100.0)
Monocytes Absolute: 0.3 10*3/uL (ref 0.1–1.0)
Monocytes Relative: 8 %
Neutro Abs: 2.4 10*3/uL (ref 1.7–7.7)
Neutrophils Relative %: 67 %
Platelets: 165 10*3/uL (ref 150–400)
RBC: 3.57 MIL/uL — ABNORMAL LOW (ref 3.87–5.11)
RDW: 16.7 % — ABNORMAL HIGH (ref 11.5–15.5)
WBC: 3.5 10*3/uL — ABNORMAL LOW (ref 4.0–10.5)
nRBC: 0 % (ref 0.0–0.2)

## 2020-09-05 LAB — PROTIME-INR
INR: 1 (ref 0.8–1.2)
Prothrombin Time: 12.4 seconds (ref 11.4–15.2)

## 2020-09-05 MED ORDER — HYDROMORPHONE HCL 1 MG/ML IJ SOLN
1.0000 mg | Freq: Once | INTRAMUSCULAR | Status: AC
  Administered 2020-09-05: 1 mg via INTRAVENOUS
  Filled 2020-09-05: qty 1

## 2020-09-05 NOTE — ED Triage Notes (Signed)
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  Reports around 8:30pm last night she felt like something broke in her mid back.  Pt unable to sit up.  Pt says usually walks some with a walker.  Pt says was laying in bed when the pain started.  Pt says is incontinent of urine but is not a new symptoms.  Denies any weakness or numbness in extremities.  EMS reports vss.  bp  148/72.

## 2020-09-05 NOTE — Telephone Encounter (Signed)
Spoke with the patient's sister today to discuss the upcoming colonoscopy. She is having significant pain in her L hip due to her metastatic cancer which will limit the positioning and taking the bowel prep. Due to this, they would like to cancel the colonoscopy. Has presented some occasional bleeding but they feel the most pressing issue at this point is her pain. I told them I understood and encouraed them to rescehdule once the hip pain is better controlled.  Ann, can you please cancel her procedure for now?

## 2020-09-05 NOTE — ED Notes (Signed)
Pt unable to tolerate sitting up in bed.lying flat o2 sat dips to 88%.  Pt says wears o2 at home prn.  Placed pt on o2 at 2liters.  Pt denies feeling sob.  O2 sat 95% on 2liters.

## 2020-09-05 NOTE — ED Provider Notes (Signed)
Patient having difficulty getting to a wheelchair.  But still wants to go home.  Nursing will have EMS take her home.  Patient followed at [facility] palliative care pain management.  Patient does have pain medications at home.  Evaluation just yesterday by telemedicine from [facility] did increase her pain medications.  Patient unfortunately has metastatic breast cancer to the bone which is understandably very painful.  But not meeting any immediate criteria for admission.   Fredia Sorrow, MD 09/05/20 [phone]

## 2020-09-05 NOTE — Telephone Encounter (Signed)
Patient's sister Izora Gala called - she has some concerns about patient having TCS - she has cancer in left hip and pain from cancer makes it hard for her to move -- please call sister to discuss [phone]

## 2020-09-05 NOTE — ED Provider Notes (Signed)
[facility] EMERGENCY DEPARTMENT Provider Note  CSN: [id number] Arrival date & time: 09/05/20 1049    History Chief Complaint  Patient presents with  . Back Pain    HPI  Stacey [name] is a 74 y.o. female with known history of breast cancer with mets to spine reports worsening of her chronic back pain since last night when she felt like something broke'. Denies any falls or injuries. Her usual pain regimen has not been controlling her pain today. Denies pain radiating into legs. No new numbness/weakness or incontinence    Past Medical History:  Diagnosis Date  . CAD in native artery    a. DES to ramus 2005 with late stent thrombosis 2006 tx with PTCA. 12/18 PCI/DES x1 to mRCA, EF 50-55%  . CHF (congestive heart failure) ([place])   . Chronic pain   . COPD (chronic obstructive pulmonary disease) ([place])   . Hyperlipidemia   . Hypertension   . Left bundle branch block   . Lymphedema   . Metastatic breast cancer ([place])    a. to bone.  . MI (myocardial infarction) ([place])   . Mild aortic stenosis 10/2017  . Morbid obesity ([place])   . PAF (paroxysmal atrial fibrillation) ([place])   . PSVT (paroxysmal supraventricular tachycardia) ([place])    a. per [facility] notes, seen on event monitor in 2014.  . Pulmonary nodules     Past Surgical History:  Procedure Laterality Date  . CORONARY STENT INTERVENTION N/A 11/26/2017   Procedure: CORONARY STENT INTERVENTION;  Surgeon: Martinique, Peter M, MD;  Location: [place] [facility];  Service: Cardiovascular;  Laterality: N/A;  . CORONARY STENT PLACEMENT    . LEFT HEART CATH AND CORONARY ANGIOGRAPHY N/A 11/26/2017   Procedure: LEFT HEART CATH AND CORONARY ANGIOGRAPHY;  Surgeon: Martinique, Peter M, MD;  Location: [place] [facility];  Service: Cardiovascular;  Laterality: N/A;    Family History  Problem Relation Age of Onset  . CAD Father 30  . Heart attack Father   . COPD Sister   . CAD Paternal Grandmother   . Sudden Cardiac Death Neg Hx     Social  History   Tobacco Use  . Smoking status: Former Smoker    Packs/day: 2.00    Years: 18.00    Pack years: 36.00    Types: Cigarettes    Quit date: 12/01/1980    Years since quitting: 39.7  . Smokeless tobacco: Never Used  Vaping Use  . Vaping Use: Never used  Substance Use Topics  . Alcohol use: No  . 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 Authorizing Provider  aspirin EC 81 MG tablet Take 81 mg by mouth daily. Swallow whole.   Yes [provider]  atorvastatin (LIPITOR) 80 MG tablet Take 1 tablet (80 mg total) by mouth daily. 05/28/20  Yes BranchAlphonse Guild, MD  b complex vitamins tablet Take 1 tablet by mouth every morning. 07/05/20  Yes Florencia Reasons, MD  Calcium Citrate-Vitamin D (CITRACAL MAXIMUM) 315-250 MG-UNIT TABS Take 2 tablets by mouth daily.    Yes [provider]  carboxymethylcellulose (REFRESH PLUS) 0.5 % SOLN Place 1 drop into both eyes in the morning, at noon, in the evening, and at bedtime.   Yes [provider]  clopidogrel (PLAVIX) 75 MG tablet Take 75 mg by mouth daily.  Yes [provider]  acetaminophen (TYLENOL) 325 MG tablet Take 2 tablets (650 mg total) by mouth every 8 (eight) hours. 07/05/20   Florencia Reasons, MD  budesonide (PULMICORT) 0.25 MG/2ML nebulizer solution Take 0.25 mg by nebulization 2 (two) times daily. Patient not taking: Reported on 09/04/2020    [provider]  clonazePAM (KLONOPIN) 1 MG disintegrating tablet Take 1 tablet (1 mg total) by mouth 3 (three) times daily as needed (anxiety). 07/05/20   Florencia Reasons, MD  dexamethasone (DECADRON) 2 MG tablet Take 2 mg by mouth daily. Patient not taking: Reported on 09/04/2020    [provider]  DULoxetine (CYMBALTA) 30 MG capsule Take 3 capsules (90 mg total) by mouth daily. Patient taking differently: Take 30 mg by mouth daily.  07/06/20   Florencia Reasons, MD  DULoxetine (CYMBALTA) 60 MG capsule Take 60 mg by mouth daily.    [provider]  lidocaine (LIDODERM) 5 % Place 2 patches onto the skin daily. Remove & Discard patch within 12 hours or as directed by MD Patient not taking: Reported on 09/04/2020 07/05/20   Florencia Reasons, MD  Lidocaine HCl-Benzyl Alcohol (SALONPAS LIDOCAINE PLUS EX) Apply 1 patch topically daily. Applied to back and/or right side buttock area Patient not taking: Reported on 09/04/2020    [provider]  losartan (COZAAR) 100 MG tablet Take 1 tablet (100 mg total) by mouth daily. 08/07/20   Arnoldo Lenis, MD  methadone (DOLOPHINE) 5 MG tablet Take 1 tablet (5 mg total) by mouth 4 (four) times daily. 07/05/20   Florencia Reasons, MD  metoprolol succinate (TOPROL-XL) 100 MG 24 hr tablet TAKE 1 AND 1/2 TABLETS DAILY Patient taking differently: Take 150 mg by mouth daily.  03/28/20   Arnoldo Lenis, MD  Multiple Vitamins-Minerals (EQ VISION FORMULA 50+ PO) Take 1 tablet by mouth every morning.    [provider]  nitroGLYCERIN (NITROSTAT) 0.4 MG SL tablet Place 1 tablet (0.4 mg total) under the tongue every 5 (five) minutes x 3 doses as needed for chest pain (if no relief after 3rd dose, proceed to the ED or call 911). 03/22/20   Branch, Alphonse Guild, MD  oxycodone (OXY-IR) 5 MG capsule Take 10 mg by mouth every 3 (three) hours as needed for pain.     [provider]  pantoprazole (PROTONIX) 40 MG tablet Take 40 mg by mouth daily. Patient not taking: Reported on 09/04/2020    [provider]  polyethylene glycol powder (GLYCOLAX/MIRALAX) 17 GM/SCOOP powder Take 17 g by mouth daily as needed for moderate constipation.     [provider]  pregabalin (LYRICA) 150 MG capsule Take 150 mg by mouth 2 (two) times daily.  08/01/19   [provider]  senna-docusate (SENOKOT-S) 8.6-50 MG tablet Take 2 tablets by mouth in the morning and at bedtime.    [provider]  spironolactone  (ALDACTONE) 25 MG tablet TAKE 1/2 TABLET(12.5 MG) BY MOUTH DAILY Patient taking differently: Take 12.5 mg by mouth daily.  03/28/20   Arnoldo Lenis, MD     Allergies    Brilinta [ticagrelor] and Albuterol   Review of Systems   Review of Systems A comprehensive review of systems was completed and negative except as noted in HPI.    Physical Exam BP 101/75   Pulse 72   Temp 98.6 F (37 C)   Resp 18   Ht 5' (1.524 m)   Wt 87.5 kg   SpO2 97%  BMI 37.69 kg/m   Physical Exam Vitals and nursing note reviewed.  Constitutional:      Appearance: Normal appearance.  HENT:     Head: Normocephalic and atraumatic.     Nose: Nose normal.     Mouth/Throat:     Mouth: Mucous membranes are moist.  Eyes:     Extraocular Movements: Extraocular movements intact.     Conjunctiva/sclera: Conjunctivae normal.  Cardiovascular:     Rate and Rhythm: Normal rate.  Pulmonary:     Effort: Pulmonary effort is normal.     Breath sounds: Normal breath sounds.  Abdominal:     General: Abdomen is flat.     Palpations: Abdomen is soft.     Tenderness: There is no abdominal tenderness.  Musculoskeletal:        General: No swelling. Normal range of motion.     Cervical back: Neck supple.     Comments: Tenderness to palpation of midline L spine around L2-3, no step offs, some surrounding paraspinal spasm associated.   Skin:    General: Skin is warm and dry.  Neurological:     General: No focal deficit present.     Mental Status: She is alert.  Psychiatric:        Mood and Affect: Mood normal.      ED Results / Procedures / Treatments   Labs (all labs ordered are listed, but only abnormal results are displayed) Labs Reviewed  BASIC METABOLIC PANEL - Abnormal; Notable for the following components:      Result Value   Glucose, Bld 105 (*)    Calcium 8.5 (*)    All other components within normal limits  CBC WITH DIFFERENTIAL/PLATELET - Abnormal; Notable for the following components:     WBC 3.5 (*)    RBC 3.57 (*)    Hemoglobin 11.4 (*)    MCV 101.4 (*)    RDW 16.7 (*)    All other components within normal limits  PROTIME-INR    EKG None  Radiology DG Lumbar Spine Complete  Result Date: 09/05/2020 CLINICAL DATA:  Back pain.  History of metastatic breast cancer. EXAM: LUMBAR SPINE - COMPLETE 4+ VIEW COMPARISON:  Lumbar spine x-rays dated July 22, 2020. FINDINGS: Hypoplastic ribs at T12. Partial sacralization of L5 on the left. Unchanged mild dextroscoliosis and mild anterolisthesis at L4-L5. Progressive L1 superior endplate compression fracture with now estimated height loss of 30%. Remaining vertebral body heights are preserved. Unchanged diffuse mild-to-moderate disc height loss and endplate spurring throughout the lumbar spine with moderate facet arthropathy. IMPRESSION: 1. Progressive L1 superior endplate compression fracture. Electronically Signed   By: Titus Dubin M.D.   On: 09/05/2020 14:27    Procedures Procedures  Medications Ordered in the ED Medications  HYDROmorphone (DILAUDID) injection 1 mg (1 mg Intravenous Given 09/05/20 1326)     MDM Rules/Calculators/A&P MDM Patient with exacerbation of chronic malignant back pain, Will give Dilaudid for pain control and send for imaging.  ED Course  I have reviewed the triage vital signs and the nursing notes.  Pertinent labs & imaging results that were available during my care of the patient were reviewed by me and considered in my medical decision making (see chart for details).  Clinical Course as of Sep 06 1009  Wed Sep 05, 2020  1249 Patient's pain is managed by [facility]. Per their note from a Televisit yesterday:Pain regimen consists of:  -Pregabalin 150mg  twice daily -Tylenol 1000mg  three times daily -Methadone 5mg   every 8 hrs -Oxycodone 5-10mg  every 4hours prn (taking ~6/day on average) -duloxetine 90mg  daily -Lidoderm patch, on 12hrs off 12 hrs -dexamethasone weaned off  several days ago    [CS]  1339 CBC shows mild leukocytopenia, no significant neutropenia, slight decrease from baseline. Otherwise no concerning findings.    [CS]  9480 INR is subtherapeutic   [CS]  1416 CMP is normal.    [CS]  1655 Patient reports improvement in pain after dilaudid.    [CS]  3748 Xray re-demonstrates her known compression fracture which unfortunately is no amenable to kyphoplasty. She reports her pain is now well controlled enough that she feels comfortable going home and taking her usual pain medications.    [CS]    Clinical Course User Index [CS] Truddie Hidden, MD    Final Clinical Impression(s) / ED Diagnoses Final diagnoses:  Chronic midline low back pain without sciatica  Malignant neoplasm metastatic to bone [facility])    Rx / DC Orders ED Discharge Orders    None       Truddie Hidden, MD 09/06/20 1010

## 2020-09-06 NOTE — Telephone Encounter (Signed)
Procedure has been cancelled.

## 2020-09-13 ENCOUNTER — Encounter (HOSPITAL_COMMUNITY): Admission: RE | Admit: 2020-09-13 | Payer: Medicare Other | Source: Ambulatory Visit

## 2020-09-13 ENCOUNTER — Other Ambulatory Visit (HOSPITAL_COMMUNITY): Payer: Medicare Other

## 2020-09-14 ENCOUNTER — Ambulatory Visit (HOSPITAL_COMMUNITY): Admit: 2020-09-14 | Payer: Medicare Other | Admitting: Gastroenterology

## 2020-09-14 ENCOUNTER — Encounter (HOSPITAL_COMMUNITY): Payer: Self-pay

## 2020-09-14 SURGERY — COLONOSCOPY WITH PROPOFOL
Anesthesia: Monitor Anesthesia Care

## 2020-09-17 ENCOUNTER — Telehealth (INDEPENDENT_AMBULATORY_CARE_PROVIDER_SITE_OTHER): Payer: Medicare Other | Admitting: Cardiology

## 2020-09-17 ENCOUNTER — Other Ambulatory Visit: Payer: Self-pay

## 2020-09-17 ENCOUNTER — Telehealth: Payer: Self-pay | Admitting: Nurse Practitioner

## 2020-09-17 ENCOUNTER — Encounter: Payer: Self-pay | Admitting: Cardiology

## 2020-09-17 NOTE — Telephone Encounter (Signed)
Called patient to offer to schedule the Palliative Consult and spoke with sister, Stacey [name].  Discussed Palliative services with her and she was in agreement with scheduling visit with NP.  I have scheduled an In-person Consult for 09/25/20 2 1:30 PM.

## 2020-09-17 NOTE — Progress Notes (Signed)
Unable to reach patient. Not available at original time of appointment, called back later afternoon and patient is asleep. Will reschedule appt     Signed, Carlyle Dolly, MD  09/17/2020 8:12 AM    [place]

## 2020-09-25 ENCOUNTER — Other Ambulatory Visit: Payer: Medicare Other | Admitting: Nurse Practitioner

## 2020-09-25 ENCOUNTER — Telehealth: Payer: Self-pay | Admitting: Nurse Practitioner

## 2020-09-25 ENCOUNTER — Other Ambulatory Visit: Payer: Self-pay

## 2020-09-25 NOTE — Telephone Encounter (Signed)
Spoke with patient's sister to see if patient was in the hospital and she said yes she was at [facility] having a pain pump inserted.  I told her that I would cancel her Palliative Consult for today and that I would call her back the end of the week to see if she was home and to possibly reschedule the visit and she was in agreement with this.  I have notified the Palliative Team of above.

## 2020-10-15 ENCOUNTER — Telehealth: Payer: Self-pay | Admitting: Nurse Practitioner

## 2020-10-15 NOTE — Telephone Encounter (Signed)
Spoke with patient and she is currently in [facility] and she was agreeable with our Palliative NP coming to see her there.  Explained to patient that we will need to contact the facility to get their permission to come to the facility to see her and then once she comes home we would continue to see her there.

## 2020-11-15 ENCOUNTER — Telehealth: Payer: Self-pay | Admitting: Nurse Practitioner

## 2020-11-15 NOTE — Telephone Encounter (Signed)
Called patient to see if she was still at [facility] and she was.  I asked patient if she was in agreement with [place] cancelling this referral (since we were unable to obtain an order from SNF to see patient there) until she has been discharged back home and then she could contact the MD office and let them know that she wished to have in-home Palliative services and she agreed to this.  I will notify MD and Palliative Team.

## 2020-12-09 NOTE — Progress Notes (Signed)
  Radiation Oncology         [phone] ________________________________  Name: Stacey [name] MRN: [id number]  Date: 06/29/2020  DOB: 1946-09-11  End of Treatment Note  Diagnosis:   75 yo woman with a painful pubic ramus bone metastasis     Indication for treatment:  Palliation of pain       Radiation treatment dates:   06/30/20  Site/dose:   The bone met got 8 Gy in 1 fraction  Beams/energy:   A 4-field static gantry plan was used with 6 and 15 MV X-rays  Narrative: The patient tolerated radiation treatment relatively well.     Plan: The patient has completed radiation treatment. The patient will return to radiation oncology clinic for routine followup in one month. I advised her to call or return sooner if she has any questions or concerns related to her recovery or treatment. ________________________________  Sheral Apley. Tammi Klippel, M.D.

## 2021-01-07 ENCOUNTER — Telehealth: Payer: Self-pay | Admitting: Cardiology

## 2021-01-07 NOTE — Telephone Encounter (Signed)
Pt is needing to someone to give her a call concerning her metoprolol succinate (TOPROL-XL) 100 MG 24 hr tablet [[id number]]    [phone]

## 2021-01-07 NOTE — Telephone Encounter (Signed)
Pt and son wanted to verify dose of Toprol XL - aware that per LOV pt is to be taking 150 mg daily (1 and 1/2 tablets) daily - also scheduled f/u appt - pt request virtual appt

## 2021-02-06 ENCOUNTER — Encounter (HOSPITAL_COMMUNITY): Payer: Self-pay

## 2021-02-06 NOTE — Progress Notes (Signed)
I have attempted to reach this patient for an introductory phone call. Unable to reach the patient at this time, I will plan to meet with the patient during her initial visit with Dr. Delton Coombes

## 2021-02-07 ENCOUNTER — Other Ambulatory Visit: Payer: Self-pay

## 2021-02-07 ENCOUNTER — Inpatient Hospital Stay (HOSPITAL_COMMUNITY): Payer: Medicare Other | Attending: Hematology | Admitting: Hematology

## 2021-02-07 VITALS — BP 165/77 | HR 65 | Temp 96.9°F | Resp 20 | Ht 60.0 in

## 2021-02-07 DIAGNOSIS — G893 Neoplasm related pain (acute) (chronic): Secondary | ICD-10-CM | POA: Insufficient documentation

## 2021-02-07 DIAGNOSIS — Z5111 Encounter for antineoplastic chemotherapy: Secondary | ICD-10-CM | POA: Diagnosis present

## 2021-02-07 DIAGNOSIS — Z170 Estrogen receptor positive status [ER+]: Secondary | ICD-10-CM | POA: Insufficient documentation

## 2021-02-07 DIAGNOSIS — C7951 Secondary malignant neoplasm of bone: Secondary | ICD-10-CM | POA: Diagnosis not present

## 2021-02-07 DIAGNOSIS — C50411 Malignant neoplasm of upper-outer quadrant of right female breast: Secondary | ICD-10-CM | POA: Diagnosis present

## 2021-02-07 DIAGNOSIS — C773 Secondary and unspecified malignant neoplasm of axilla and upper limb lymph nodes: Secondary | ICD-10-CM | POA: Diagnosis present

## 2021-02-07 DIAGNOSIS — Z87891 Personal history of nicotine dependence: Secondary | ICD-10-CM | POA: Diagnosis not present

## 2021-02-07 DIAGNOSIS — C50919 Malignant neoplasm of unspecified site of unspecified female breast: Secondary | ICD-10-CM

## 2021-02-07 LAB — COMPREHENSIVE METABOLIC PANEL
ALT: 145 U/L — ABNORMAL HIGH (ref 0–44)
AST: 252 U/L — ABNORMAL HIGH (ref 15–41)
Albumin: 3.1 g/dL — ABNORMAL LOW (ref 3.5–5.0)
Alkaline Phosphatase: 113 U/L (ref 38–126)
Anion gap: 8 (ref 5–15)
BUN: 22 mg/dL (ref 8–23)
CO2: 32 mmol/L (ref 22–32)
Calcium: 8.8 mg/dL — ABNORMAL LOW (ref 8.9–10.3)
Chloride: 103 mmol/L (ref 98–111)
Creatinine, Ser: 0.68 mg/dL (ref 0.44–1.00)
GFR, Estimated: >60 mL/min (ref >60)
Glucose, Bld: 136 mg/dL — ABNORMAL HIGH (ref 70–99)
Potassium: 3.8 mmol/L (ref 3.5–5.1)
Sodium: 143 mmol/L (ref 135–145)
Total Bilirubin: 0.9 mg/dL (ref 0.3–1.2)
Total Protein: 5.9 g/dL — ABNORMAL LOW (ref 6.5–8.1)

## 2021-02-07 LAB — CBC WITH DIFFERENTIAL/PLATELET
Abs Immature Granulocytes: 0.01 10*3/uL (ref 0.00–0.07)
Basophils Absolute: 0 10*3/uL (ref 0.0–0.1)
Basophils Relative: 1 %
Eosinophils Absolute: 0.2 10*3/uL (ref 0.0–0.5)
Eosinophils Relative: 4 %
HCT: 35 % — ABNORMAL LOW (ref 36.0–46.0)
Hemoglobin: 11.3 g/dL — ABNORMAL LOW (ref 12.0–15.0)
Immature Granulocytes: 0 %
Lymphocytes Relative: 21 %
Lymphs Abs: 0.9 10*3/uL (ref 0.7–4.0)
MCH: 32.3 pg (ref 26.0–34.0)
MCHC: 32.3 g/dL (ref 30.0–36.0)
MCV: 100 fL (ref 80.0–100.0)
Monocytes Absolute: 0.2 10*3/uL (ref 0.1–1.0)
Monocytes Relative: 6 %
Neutro Abs: 2.8 10*3/uL (ref 1.7–7.7)
Neutrophils Relative %: 68 %
Platelets: 179 10*3/uL (ref 150–400)
RBC: 3.5 MIL/uL — ABNORMAL LOW (ref 3.87–5.11)
RDW: 15.3 % (ref 11.5–15.5)
WBC: 4.1 10*3/uL (ref 4.0–10.5)
nRBC: 0 % (ref 0.0–0.2)

## 2021-02-07 LAB — MAGNESIUM: Magnesium: 1.9 mg/dL (ref 1.7–2.4)

## 2021-02-07 MED ORDER — FULVESTRANT 250 MG/5ML IM SOLN
500.0000 mg | Freq: Once | INTRAMUSCULAR | Status: AC
  Administered 2021-02-07: 500 mg via INTRAMUSCULAR
  Filled 2021-02-07: qty 10

## 2021-02-07 MED ORDER — OXYCODONE HCL 5 MG PO TABS
5.0000 mg | ORAL_TABLET | Freq: Once | ORAL | Status: AC
  Administered 2021-02-07: 5 mg via ORAL
  Filled 2021-02-07: qty 1

## 2021-02-07 NOTE — Patient Instructions (Signed)
[place] at [facility] Discharge Instructions  You were seen and examined today by Dr. Delton Coombes. Dr. Delton Coombes is a medical oncologist, meaning he specializes in the management of cancer diagnoses with medications. Dr. Delton Coombes discussed your past medical history, family history of cancer and the events that led to you being here today.  As you know, you have been diagnosed with metastatic breast cancer. This means that your breast cancer has spread outside of your breasts and to your bones. This means that there is no option to cure your cancer.  According to the note from [facility], you last had Faslodex in December 2021. This is an injection to treat your breast cancer that should be given monthly.   Dr. Delton Coombes has recommended scans to see the status of your cancer right now. Our office will arrange that. Our office will arrange for those scans and for you to continue the Faslodex.    Thank you for choosing [place] at [facility] to provide your oncology and hematology care.  To afford each patient quality time with our provider, please arrive at least 15 minutes before your scheduled appointment time.   If you have a lab appointment with the [facility] please come in thru the Main Entrance and check in at the main information desk.  You need to re-schedule your appointment should you arrive 10 or more minutes late.  We strive to give you quality time with our providers, and arriving late affects you and other patients whose appointments are after yours.  Also, if you no show three or more times for appointments you may be dismissed from the clinic at the providers discretion.     Again, thank you for choosing [facility].  Our hope is that these requests will decrease the amount of time that you wait before being seen by our physicians.        _____________________________________________________________  Should you have questions after your visit to [facility], please contact our office at [phone] and follow the prompts.  Our office hours are 8:00 a.m. and 4:30 p.m. Monday - Friday.  Please note that voicemails left after 4:00 p.m. may not be returned until the following business day.  We are closed weekends and major holidays.  You do have access to a nurse 24-7, just call the main number to the clinic [phone] and do not press any options, hold on the line and a nurse will answer the phone.    For prescription refill requests, have your pharmacy contact our office and allow 72 hours.    Due to Covid, you will need to wear a mask upon entering the hospital. If you do not have a mask, a mask will be given to you at the Main Entrance upon arrival. For doctor visits, patients may have 1 support person age 69 or older with them. For treatment visits, patients can not have anyone with them due to social distancing guidelines and our immunocompromised population.

## 2021-02-07 NOTE — Progress Notes (Signed)
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  HISTORY OF PRESENTING ILLNESS:  Stacey [name] 76 y.o. 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 Cloyd Stagers from [facility]. She came to her PCP for her annual visit on 01/02/2016 and a right breast mass with overlying skin changes were noted. She started Femara on 02/25/2016 and stopped on 01/2019 due to progression and fracture of L1. Since 01/2019 she was transitioned to Faslodex; last injection was on 11/06/2020.  Today she reports feeling poor due to her back pain, which seems to be hurting more with the cold weather. She complains of having pain in her right breast where the mass is, which she does not have in the left breast. She was tolerating the Faslodex well. She takes oxycodone TID to QID for her pain. She is taking Lyrica for her neuropathy. She notes that her leg weakness has been progressively worsening over the years and then developed pain in bilateral hips; she notes having arthritis in her legs, but denies ever being paralyzed. She wears a pain pump in her RLQ of abdomen.  She now lives with her son. She is limited in her mobility and can change her underwear, wash her upper body, and partly dress herself; she is unable to stand to cook due to being deconditioned for about 2 years, though she can feed herself. She wears incontinence underwear since she cannot sit on the commode. She denies having any bed sores. She denies family history of cancer. She used to work for [employer], working the Psychologist, clinical. She smoked in her teenage years.  I reviewed her records  extensively and collaborated the history with the patient.  SUMMARY OF ONCOLOGIC HISTORY: Oncology History  Metastatic breast cancer ([place])  11/23/2017 Initial Diagnosis   Metastatic breast cancer ([place])   02/07/2021 Cancer Staging   Staging form: Breast, AJCC 8th Edition - Clinical stage from 02/07/2021: Stage IV (cT2, cN1, pM1, G1, ER+, PR+, HER2: Equivocal) - Signed by Derek Jack, MD on 02/07/2021 Histopathologic type: Infiltrating duct carcinoma, NOS Stage prefix: Initial diagnosis Histologic grading system: 3 grade system     In terms of breast cancer risk profile:  She has no family history of Breast/GYN/GI cancer.  MEDICAL HISTORY:  Past Medical History:  Diagnosis Date  . CAD in native artery    a. DES to ramus 2005 with late stent thrombosis 2006 tx with PTCA. 12/18 PCI/DES x1 to mRCA, EF 50-55%  . CHF (congestive heart failure) ([place])   . Chronic pain   . COPD (chronic obstructive pulmonary disease) ([place])   . Hyperlipidemia   . Hypertension   . Left bundle branch block   . Lymphedema   . Metastatic breast cancer ([place])    a. to bone.  . MI (myocardial infarction) ([place])   . Mild aortic stenosis 10/2017  . Morbid obesity ([place])   . PAF (paroxysmal atrial fibrillation) ([place])   . PSVT (paroxysmal supraventricular tachycardia) ([place])    a. per [facility] notes, seen on event monitor in 2014.  . Pulmonary nodules     SURGICAL HISTORY: Past Surgical History:  Procedure Laterality Date  . CORONARY STENT INTERVENTION N/A 11/26/2017  Procedure: CORONARY STENT INTERVENTION;  Surgeon: Swaziland, Peter M, MD;  Location[facility] INVASIVE CV LAB;  Service: Cardiovascular;  Laterality: N/A;  . CORONARY STENT PLACEMENT    . LEFT HEART CATH AND CORONARY ANGIOGRAPHY N/A 11/26/2017   Procedure: LEFT HEART CATH AND CORONARY ANGIOGRAPHY;  Surgeon: Swaziland, Peter M, MD;  Location: [facility] INVASIVE CV LAB;  Service: Cardiovascular;  Laterality: N/A;    SOCIAL HISTORY: Social History    Socioeconomic History  . Marital status: Single    Spouse name: Not on file  . Number of children: Not on file  . Years of education: Not on file  . Highest education level: Not on file  Occupational History  . Occupation: Retired  Tobacco Use  . Smoking status: Former Smoker    Packs/day: 2.00    Years: 18.00    Pack years: 36.00    Types: Cigarettes    Quit date: 12/01/1980    Years since quitting: 40.2  . Smokeless tobacco: Never Used  Vaping Use  . Vaping Use: Never used  Substance and Sexual Activity  . Alcohol use: No  . 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 Sexual activity: Not on file  Other Topics Concern  . Not on file  Social History Narrative  . Not on file   Social Determinants of Health   Financial Resource Strain: Not on file  Food Insecurity: Not on file  Transportation Needs: No Transportation Needs  . Lack of Transportation (Medical): No  . Lack of Transportation (Non-Medical): No  Physical Activity: Inactive  . Days of Exercise per Week: 0 days  . Minutes of Exercise per Session: 0 min  Stress: Not on file  Social Connections: Not on file  Intimate Partner Violence: Not At Risk  . Fear of Current or Ex-Partner: No  . Emotionally Abused: No  . Physically Abused: No  . Sexually Abused: No    FAMILY HISTORY: Family History  Problem Relation Age of Onset  . CAD Father 78  . Heart attack Father   . COPD Sister   . CAD Paternal Grandmother   . Sudden Cardiac Death Neg Hx     ALLERGIES:  is allergic to brilinta [ticagrelor], budesonide, and albuterol.  MEDICATIONS:  Current Outpatient Medications  Medication Sig Dispense Refill  . acetaminophen (TYLENOL) 650 MG CR tablet Take by mouth.    Marland Kitchen aspirin EC 81 MG tablet Take 81 mg by mouth daily. Swallow whole.    Marland Kitchen atorvastatin (LIPITOR) 80 MG tablet Take 1 tablet (80 mg total) by mouth daily. 90 tablet 2  . b complex vitamins tablet Take 1 tablet by mouth every  morning. 30 tablet 0  . Calcium Citrate-Vitamin D 315-250 MG-UNIT TABS Take 2 tablets by mouth daily.     . carboxymethylcellulose (REFRESH PLUS) 0.5 % SOLN Place 1 drop into both eyes in the morning, at noon, in the evening, and at bedtime.    . clonazePAM (KLONOPIN) 1 MG disintegrating tablet Take 1 tablet (1 mg total) by mouth 3 (three) times daily as needed (anxiety). 10 tablet 0  . clopidogrel (PLAVIX) 75 MG tablet Take by mouth.    . diclofenac Sodium (VOLTAREN) 1 % GEL Apply 2 g topically 4 (four) times daily.    . DULoxetine (CYMBALTA) 30 MG capsule Take 3 capsules (90 mg total) by mouth daily. (Patient taking differently: Take 30 mg by mouth daily.) 30 capsule 0  . DULoxetine (CYMBALTA) 60  MG capsule Take 60 mg by mouth daily.    Marland Kitchen losartan (COZAAR) 100 MG tablet Take 1 tablet (100 mg total) by mouth daily. 90 tablet 1  . methadone (DOLOPHINE) 5 MG tablet Take 1 tablet (5 mg total) by mouth 4 (four) times daily. 30 tablet 0  . metoprolol succinate (TOPROL-XL) 100 MG 24 hr tablet TAKE 1 AND 1/2 TABLETS DAILY (Patient taking differently: Take 150 mg by mouth daily.) 135 tablet 2  . metoprolol tartrate (LOPRESSOR) 100 MG tablet Take by mouth.    . morphine 5 mg/mL injection     . Multiple Vitamin (MULTIVITAMIN ADULT PO) Take by mouth.    . Multiple Vitamins-Minerals (EQ VISION FORMULA 50+ PO) Take 1 tablet by mouth every morning.    . nitroGLYCERIN (NITROSTAT) 0.4 MG SL tablet Place 1 tablet (0.4 mg total) under the tongue every 5 (five) minutes x 3 doses as needed for chest pain (if no relief after 3rd dose, proceed to the ED or call 911). 25 tablet 3  . oxyCODONE (OXY IR/ROXICODONE) 5 MG immediate release tablet Take by mouth.    Derrill Memo ON 02/21/2021] PAIN MANAGEMENT INTRATHECAL, IT, PUMP by Intrathecal route.    . polyethylene glycol powder (GLYCOLAX/MIRALAX) 17 GM/SCOOP powder Take 17 g by mouth daily as needed for moderate constipation.     . pregabalin (LYRICA) 50 MG capsule Take by  mouth.    . senna-docusate (SENOKOT-S) 8.6-50 MG tablet 1 tablet once daily    . spironolactone (ALDACTONE) 25 MG tablet TAKE 1/2 TABLET(12.5 MG) BY MOUTH DAILY (Patient taking differently: Take 12.5 mg by mouth daily.) 45 tablet 2   No current facility-administered medications for this visit.    REVIEW OF SYSTEMS:   Review of Systems  Constitutional: Positive for fatigue (25%). Negative for appetite change.  Cardiovascular: Positive for chest pain (R breast painful to touch).  Musculoskeletal: Positive for back pain (10/10 back pain radiating to leg).  Neurological: Positive for extremity weakness (bilateral legs weakness).  All other systems reviewed and are negative.   PHYSICAL EXAMINATION: ECOG PERFORMANCE STATUS: 4 - Bedbound  Vitals:   02/07/21 1336  BP: (!) 165/77  Pulse: 65  Resp: 20  Temp: (!) 96.9 F (36.1 C)  SpO2: 94%   Filed Weights   Physical Exam Vitals reviewed.  Constitutional:      Appearance: Normal appearance. She is obese.  Cardiovascular:     Rate and Rhythm: Normal rate and regular rhythm.     Pulses: Normal pulses.     Heart sounds: Normal heart sounds.  Pulmonary:     Effort: Pulmonary effort is normal.     Breath sounds: Normal breath sounds.  Chest:  Breasts:     Right: Mass (RUOQ mass), tenderness (UOQ mass TTP) and axillary adenopathy (2 cm LN) present. No bleeding, nipple discharge, skin change or supraclavicular adenopathy.     Left: No swelling, bleeding, inverted nipple, mass, nipple discharge, skin change, tenderness, axillary adenopathy or supraclavicular adenopathy.    Abdominal:     Palpations: Abdomen is soft. There is no hepatomegaly, splenomegaly or mass.     Tenderness: There is no abdominal tenderness.     Hernia: No hernia is present.    Lymphadenopathy:     Upper Body:     Right upper body: Axillary adenopathy (2 cm LN) present. No supraclavicular adenopathy.     Left upper body: No supraclavicular or axillary  adenopathy.  Neurological:     General: No focal deficit  present.     Mental Status: She is alert and oriented to person, place, and time.  Psychiatric:        Mood and Affect: Mood normal.        Behavior: Behavior normal.      LABORATORY DATA:  I have reviewed the data as listed No results found for this or any previous visit (from the past 2160 hour(s)).  RADIOGRAPHIC STUDIES: I have personally reviewed the radiological reports and agreed with the findings in the report. No results found.   ASSESSMENT:  1.  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 -Ultrasound-guided biopsy on 01/07/2016 showed grade 1 invasive ductal carcinoma, ER positive, PR positive, HER-2 equivocal by FISH, right axillary lymph node with metastatic adenocarcinoma.  HER-2 equivocal since her 2/CEP17 ratio less than 2 and average copy number of her 2 is greater than or equal to 4 but less than 6 signals per cell. -Bone biopsy on 05/30/2016 consistent with metastatic breast cancer. -Palliative therapy with letrozole started under the direction of Dr. Deitra Mayo at [facility] from 02/25/2016 through February 2020, progressive symptoms with increased soft tissue extension at L2. 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 -Addition of Leslee Home was discussed but she was concerned about cost of the medication. -CT abdomen and pelvis on 08/13/2020 shows acute compression fracture deformity of the L1 vertebral body and other chronic findings. -Her last Faslodex injection at [facility] was on 11/06/2020.  2.  Social/family history: -She worked for [employer] prior to retirement in 2010. -She is currently living with her son who is helping with her day-to-day  activities.  Daughter lives in [place].  She quit smoking 35 to 40 years ago. -She is mostly confined to bed.  However she is able to change clothes, able to wash upper body, halfway dress herself.  She cannot stand up for the past 2 years because of weakness in the legs and knee arthritis.  She wears adult diapers.    PLAN:  1.  Metastatic right breast cancer to the bones, ER/PR positive and HER-2 equivocal: -Patient was receiving treatment at [facility] under the care of Dr. Deitra Mayo. -She wanted to transfer her care to our facility due to proximity and difficulty with transportation. -She had to be brought in today by EMS for our clinic visit. -As she did not receive Faslodex since December of last year, we will proceed with her injection today. -We will obtain baseline labs today along with tumor markers. -I will also order restaging scans with CT CAP and bone scan.  2.  Cancer associated pain: -She has compression fracture of L1/L2. -She follows up with pain clinic at [facility].  She has intrathecal pump which is morphine. -She takes oxycodone 5 mg 3-4 times a day as needed for breakthrough pain. -Continue duloxetine 90 mg daily and Lyrica 50 mg 3 times a day.    All questions were answered. The patient knows to call the clinic with any problems, questions or concerns.   Derek Jack, MD 02/07/21 2:22 PM  [place] [phone]   I, Milinda Antis, am acting as a scribe for Dr. Sanda Linger.  I, Derek Jack MD, have reviewed the above documentation for accuracy and completeness, and  I agree with the above.

## 2021-02-07 NOTE — Progress Notes (Signed)
Stacey [name] presents today for office visit and  injection per the provider's orders.  Faslodex administration without incident; injection site WNL; see MAR for injection details.  Patient remained stable during procedure.  No questions or complaints noted at this time.  Patient discharged in alert and stable condition via wheelchair with family member.

## 2021-02-07 NOTE — Progress Notes (Signed)
START ON PATHWAY REGIMEN - Breast     Cycle 1: A cycle is 28 days:     Fulvestrant    Cycles 2 and beyond: A cycle is every 28 days:     Fulvestrant   **Always confirm dose/schedule in your pharmacy ordering system**  Patient Characteristics: Distant Metastases or Locoregional Recurrent Disease - Unresected or Locally Advanced Unresectable Disease Progressing after Neoadjuvant and Local Therapies, HER2 Negative/Unknown/Equivocal, ER Positive, Endocrine Therapy, Postmenopausal, Second Line,  Not a Candidate for a CDK4/6 Inhibitor, PIK3CA Wildtype/ Unknown Therapeutic Status: Distant Metastases ER Status: Positive (+) HER2 Status: Equivocal PR Status: Positive (+) Therapy Approach Indicated: Standard Chemotherapy/Endocrine Therapy Menopausal Status: Postmenopausal Line of Therapy: Second Line PIK3CA Mutation Status: Did Not Order Test Intent of Therapy: Non-Curative / Palliative Intent, Discussed with Patient

## 2021-02-08 LAB — CANCER ANTIGEN 27.29: CA 27.29: 20.1 U/mL (ref 0.0–38.6)

## 2021-02-08 LAB — CANCER ANTIGEN 15-3: CA 15-3: 17.1 U/mL (ref 0.0–25.0)

## 2021-02-19 ENCOUNTER — Ambulatory Visit (HOSPITAL_COMMUNITY)
Admission: RE | Admit: 2021-02-19 | Discharge: 2021-02-19 | Disposition: A | Discharge status: Home / Self Care | Payer: Medicare Other | Source: Ambulatory Visit | Attending: Hematology | Admitting: Hematology

## 2021-02-19 ENCOUNTER — Encounter (HOSPITAL_COMMUNITY): Payer: Self-pay | Admitting: Radiology

## 2021-02-19 ENCOUNTER — Encounter (HOSPITAL_COMMUNITY)
Admission: RE | Admit: 2021-02-19 | Discharge: 2021-02-19 | Disposition: A | Discharge status: Home / Self Care | Payer: Medicare Other | Source: Ambulatory Visit | Attending: Hematology | Admitting: Hematology

## 2021-02-19 DIAGNOSIS — C50411 Malignant neoplasm of upper-outer quadrant of right female breast: Secondary | ICD-10-CM | POA: Insufficient documentation

## 2021-02-19 DIAGNOSIS — Z170 Estrogen receptor positive status [ER+]: Secondary | ICD-10-CM | POA: Insufficient documentation

## 2021-02-19 MED ORDER — TECHNETIUM TC 99M MEDRONATE IV KIT
20.0000 | PACK | Freq: Once | INTRAVENOUS | Status: AC | PRN
  Administered 2021-02-19: 17.5 via INTRAVENOUS

## 2021-02-19 MED ORDER — IOHEXOL 300 MG/ML  SOLN
100.0000 mL | Freq: Once | INTRAMUSCULAR | Status: AC | PRN
  Administered 2021-02-19: 100 mL via INTRAVENOUS

## 2021-02-22 ENCOUNTER — Ambulatory Visit (HOSPITAL_COMMUNITY): Payer: Medicare Other

## 2021-02-26 ENCOUNTER — Inpatient Hospital Stay (HOSPITAL_COMMUNITY): Payer: Medicare Other

## 2021-02-26 ENCOUNTER — Inpatient Hospital Stay (HOSPITAL_BASED_OUTPATIENT_CLINIC_OR_DEPARTMENT_OTHER): Payer: Medicare Other | Admitting: Hematology

## 2021-02-26 ENCOUNTER — Other Ambulatory Visit: Payer: Self-pay

## 2021-02-26 VITALS — BP 152/62 | HR 82 | Temp 97.1°F | Resp 20

## 2021-02-26 DIAGNOSIS — C50411 Malignant neoplasm of upper-outer quadrant of right female breast: Secondary | ICD-10-CM | POA: Diagnosis not present

## 2021-02-26 DIAGNOSIS — Z5111 Encounter for antineoplastic chemotherapy: Secondary | ICD-10-CM | POA: Diagnosis not present

## 2021-02-26 DIAGNOSIS — Z170 Estrogen receptor positive status [ER+]: Secondary | ICD-10-CM | POA: Diagnosis not present

## 2021-02-26 LAB — HEPATIC FUNCTION PANEL
ALT: 14 U/L (ref 0–44)
AST: 25 U/L (ref 15–41)
Albumin: 3.6 g/dL (ref 3.5–5.0)
Alkaline Phosphatase: 60 U/L (ref 38–126)
Bilirubin, Direct: 0.2 mg/dL (ref 0.0–0.2)
Indirect Bilirubin: 1.2 mg/dL — ABNORMAL HIGH (ref 0.3–0.9)
Total Bilirubin: 1.4 mg/dL — ABNORMAL HIGH (ref 0.3–1.2)
Total Protein: 6.6 g/dL (ref 6.5–8.1)

## 2021-02-26 NOTE — Patient Instructions (Signed)
[place] at [facility] Discharge Instructions  You were seen today by Dr. Delton Coombes. He went over your recent results and scans. You had labs drawn today to determine your liver numbers. Keep your appointment on April 7th to have your Faslodex injection. Dr. Delton Coombes will see you back in 2 months for labs and follow up.   Thank you for choosing [place] at [facility] to provide your oncology and hematology care.  To afford each patient quality time with our provider, please arrive at least 15 minutes before your scheduled appointment time.   If you have a lab appointment with the [place] please come in thru the Main Entrance and check in at the main information desk  You need to re-schedule your appointment should you arrive 10 or more minutes late.  We strive to give you quality time with our providers, and arriving late affects you and other patients whose appointments are after yours.  Also, if you no show three or more times for appointments you may be dismissed from the clinic at the providers discretion.     Again, thank you for choosing [facility]  Our hope is that these requests will decrease the amount of time that you wait before being seen by our physicians.       _____________________________________________________________  Should you have questions after your visit to [facility], please contact our office at [phone] between the hours of 8:00 a.m. and 4:30 p.m.  Voicemails left after 4:00 p.m. will not be returned until the following business day.  For prescription refill requests, have your pharmacy contact our office and allow 72 hours.    Cancer Center Support Programs:   > Cancer Support Group  2nd Tuesday of the month 1pm-2pm, Journey Room

## 2021-02-26 NOTE — Progress Notes (Signed)
Stacey [name] [address] [place] [address]   Patient Care Team: Sela Hilding as PCP - General (Internal Medicine) Brien Mates, RN as Oncology Nurse Navigator (Oncology)  SUMMARY OF ONCOLOGIC HISTORY: Oncology History  Metastatic breast cancer ([place])  11/23/2017 Initial Diagnosis   Metastatic breast cancer ([place])   02/07/2021 Cancer Staging   Staging form: Breast, AJCC 8th Edition - Clinical stage from 02/07/2021: Stage IV (cT2, cN1, pM1, G1, ER+, PR+, HER2: Equivocal) - Signed by Derek Jack, MD on 02/07/2021 Histopathologic type: Infiltrating duct carcinoma, NOS Stage prefix: Initial diagnosis Histologic grading system: 3 grade system   02/07/2021 -  Chemotherapy    Patient is on Treatment Plan: BREAST FULVESTRANT Q28D        CHIEF COMPLIANT: Follow-up for 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 is a 75 y.o. 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 Her last visit was on 02/07/2021.   Today she is accompanied by her son, [name], and she reports feeling okay. She complains of having tenderness under her right breast which has become worse. She tolerated the Faslodex well and has never had any issues with it previously. She complains that her bed at home is not comfortable since she cannot find a comfortable position to be in and requests to have a hospital-grade bed so that the head of the bed can be angled upward.   REVIEW OF SYSTEMS:   Review of Systems  Constitutional: Positive for fatigue (50%). Negative for appetite change.  Cardiovascular: Positive for chest pain (soreness under R breast).  Gastrointestinal: Positive for constipation.  Musculoskeletal: Positive for arthralgias (10/10 R leg pain).  Neurological: Positive for dizziness (when turning quickly).  All other systems reviewed and are negative.   I have reviewed the past medical  history, past surgical history, social history and family history with the patient and they are unchanged from previous note.   ALLERGIES:   is allergic to brilinta [ticagrelor], budesonide, and albuterol.   MEDICATIONS:  Current Outpatient Medications  Medication Sig Dispense Refill  . aspirin EC 81 MG tablet Take 81 mg by mouth daily. Swallow whole.    Marland Kitchen atorvastatin (LIPITOR) 80 MG tablet Take 1 tablet (80 mg total) by mouth daily. 90 tablet 2  . b complex vitamins tablet Take 1 tablet by mouth every morning. 30 tablet 0  . Calcium Citrate-Vitamin D 315-250 MG-UNIT TABS Take 2 tablets by mouth daily.     . carboxymethylcellulose (REFRESH PLUS) 0.5 % SOLN Place 1 drop into both eyes in the morning, at noon, in the evening, and at bedtime.    . clonazePAM (KLONOPIN) 1 MG disintegrating tablet Take 1 tablet (1 mg total) by mouth 3 (three) times daily as needed (anxiety). 10 tablet 0  . clopidogrel (PLAVIX) 75 MG tablet Take by mouth.    . diclofenac Sodium (VOLTAREN) 1 % GEL Apply 2 g topically 4 (four) times daily.    . DULoxetine (CYMBALTA) 30 MG capsule Take 3 capsules (90 mg total) by mouth daily. (Patient taking differently: Take 30 mg by mouth daily.) 30 capsule 0  . DULoxetine (CYMBALTA) 60 MG capsule Take 60 mg by mouth daily.    Marland Kitchen losartan (COZAAR) 100 MG tablet Take 1 tablet (100 mg total) by mouth daily. 90 tablet 1  . methadone (DOLOPHINE) 5 MG tablet Take 1 tablet (5 mg total) by mouth 4 (four)  times daily. 30 tablet 0  . metoprolol succinate (TOPROL-XL) 100 MG 24 hr tablet TAKE 1 AND 1/2 TABLETS DAILY (Patient taking differently: Take 150 mg by mouth daily.) 135 tablet 2  . metoprolol tartrate (LOPRESSOR) 100 MG tablet Take by mouth.    . morphine 5 mg/mL injection     . Multiple Vitamin (MULTIVITAMIN ADULT PO) Take by mouth.    . Multiple Vitamins-Minerals (EQ VISION FORMULA 50+ PO) Take 1 tablet by mouth every morning.    . nitroGLYCERIN (NITROSTAT) 0.4 MG SL tablet Place 1  tablet (0.4 mg total) under the tongue every 5 (five) minutes x 3 doses as needed for chest pain (if no relief after 3rd dose, proceed to the ED or call 911). 25 tablet 3  . oxyCODONE (OXY IR/ROXICODONE) 5 MG immediate release tablet Take by mouth.    Marland Kitchen PAIN MANAGEMENT INTRATHECAL, IT, PUMP by Intrathecal route.    . polyethylene glycol powder (GLYCOLAX/MIRALAX) 17 GM/SCOOP powder Take 17 g by mouth daily as needed for moderate constipation.     . pregabalin (LYRICA) 50 MG capsule Take by mouth.    . senna-docusate (SENOKOT-S) 8.6-50 MG tablet 1 tablet once daily    . spironolactone (ALDACTONE) 25 MG tablet TAKE 1/2 TABLET(12.5 MG) BY MOUTH DAILY (Patient taking differently: Take 12.5 mg by mouth daily.) 45 tablet 2  . acetaminophen (TYLENOL) 650 MG CR tablet Take by mouth. (Patient not taking: Reported on 02/26/2021)     No current facility-administered medications for this visit.     PHYSICAL EXAMINATION: Performance status (ECOG): 4 - Bedbound  Vitals:   02/26/21 1417  BP: (!) 152/62  Pulse: 82  Resp: 20  Temp: (!) 97.1 F (36.2 C)  SpO2: 98%   Wt Readings from Last 3 Encounters:  09/17/20 192 lb (87.1 kg)  09/05/20 193 lb (87.5 kg)  08/31/20 195 lb (88.5 kg)   Physical Exam Vitals reviewed.  Constitutional:      Appearance: Normal appearance. She is obese.     Comments: Bedbound  Chest:  Breasts:     Right: Mass (UOQ) and tenderness present.    Neurological:     General: No focal deficit present.     Mental Status: She is alert and oriented to person, place, and time.  Psychiatric:        Mood and Affect: Mood normal.        Behavior: Behavior normal.     Breast Exam Chaperone: Milinda Antis, MD     LABORATORY DATA:  I have reviewed the data as listed CMP Latest Ref Rng & Units 02/07/2021 09/05/2020 07/05/2020  Glucose 70 - 99 mg/dL 136(H) 105(H) 103(H)  BUN 8 - 23 mg/dL 22 14 28(H)  Creatinine 0.44 - 1.00 mg/dL 0.68 0.56 0.75  Sodium 135 - 145 mmol/L 143  139 138  Potassium 3.5 - 5.1 mmol/L 3.8 3.9 4.8  Chloride 98 - 111 mmol/L 103 102 102  CO2 22 - 32 mmol/L 32 31 29  Calcium 8.9 - 10.3 mg/dL 8.8(L) 8.5(L) 8.7(L)  Total Protein 6.5 - 8.1 g/dL 5.9(L) - -  Total Bilirubin 0.3 - 1.2 mg/dL 0.9 - -  Alkaline Phos 38 - 126 U/L 113 - -  AST 15 - 41 U/L 252(H) - -  ALT 0 - 44 U/L 145(H) - -   Lab Results  Component Value Date   CAN153 17.1 02/07/2021   Lab Results  Component Value Date   WBC 4.1 02/07/2021   HGB 11.3 (  L) 02/07/2021   HCT 35.0 (L) 02/07/2021   MCV 100.0 02/07/2021   PLT 179 02/07/2021   NEUTROABS 2.8 02/07/2021    ASSESSMENT:  1.  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 -Ultrasound-guided biopsy on 01/07/2016 showed grade 1 invasive ductal carcinoma, ER positive, PR positive, HER-2 equivocal by FISH, right axillary lymph node with metastatic adenocarcinoma.  HER-2 equivocal since her 2/CEP17 ratio less than 2 and average copy number of her 2 is greater than or equal to 4 but less than 6 signals per cell. -Bone biopsy on 05/30/2016 consistent with metastatic breast cancer. -Palliative therapy with letrozole started under the direction of Dr. Deitra Mayo at [facility] from 02/25/2016 through February 2020, progressive symptoms with increased soft tissue extension at L2. 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 -Addition of Leslee Home was discussed but she was concerned about cost of the medication. -CT abdomen and pelvis on 08/13/2020 shows acute compression fracture deformity of the L1 vertebral body and other chronic findings. -Her last Faslodex injection at [place] was on 11/06/2020. -CT CAP on 02/19/2021 with no evidence of for solid organ metastasis or nodal metastasis.   Right breast lesion is stable.  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 -Bone scan on 02/19/2021 was negative for bone mets.  2.  Social/family history: -She worked for [employer] prior to retirement in 2010. -She is currently living with her son who is helping with her day-to-day activities.  Daughter lives in [place].  She quit smoking 35 to 40 years ago. -She is mostly confined to bed.  However she is able to change clothes, able to wash upper body, halfway dress herself.  She cannot stand up for the past 2 years because of weakness in the legs and knee arthritis.  She wears adult diapers.   PLAN:  1.  Metastatic right breast cancer to the bones, ER/PR positive and HER-2 equivocal: -She was started back on Faslodex on 02/07/2021. -She does not report any new onset pains. -Reviewed images of the CT CAP from 02/19/2021 which showed stable appearance of the tumor in the right breast with no new findings suggest nodule or solid organ metastasis. -Reviewed images of the bone scan from 02/19/2021 which showed scattered degenerative changes. -Reviewed labs from 02/07/2021 which showed elevated LFTs including AST and ALT.  Tumor markers are within normal limits. -Repeated another set of LFTs today which showed normalized AST and ALT.  However total bilirubin was 1.4.  We will follow up on it at later visit. -She will receive her Faslodex next week.  RTC 2 months for follow-up.  2.  Cancer associated pain: -She has compression fracture of L1/L2. -She follows up with [facility] clinic [facility].  She has intrathecal pump which has morphine. -Continue oxycodone 5 mg 3-4 times daily as needed for breakthrough pain. -Continue duloxetine 90 mg daily and Lyrica 50 mg 3 times daily.   Breast Cancer therapy associated bone loss: I have recommended calcium, Vitamin D and weight bearing exercises.   Orders Placed This Encounter  Procedures  . Hepatic function panel    Standing  Status:   Future    Number of Occurrences:   1    Standing Expiration Date:   02/26/2022  The patient has a good understanding of the overall plan. she agrees with it. she will call with any problems that may develop before the next visit here.    Derek Jack, MD [place] [phone]   I, Milinda Antis, am acting as a scribe for Dr. Sanda Linger.  I, Derek Jack MD, have reviewed the above documentation for accuracy and completeness, and I agree with the above.

## 2021-03-07 ENCOUNTER — Inpatient Hospital Stay (HOSPITAL_COMMUNITY): Payer: Medicare Other

## 2021-03-08 ENCOUNTER — Other Ambulatory Visit (HOSPITAL_COMMUNITY): Payer: Self-pay | Admitting: Physician Assistant

## 2021-03-08 ENCOUNTER — Other Ambulatory Visit: Payer: Self-pay

## 2021-03-08 ENCOUNTER — Inpatient Hospital Stay (HOSPITAL_COMMUNITY): Payer: Medicare Other | Attending: Hematology

## 2021-03-08 VITALS — BP 181/77 | HR 85 | Temp 97.7°F | Resp 18

## 2021-03-08 DIAGNOSIS — C50411 Malignant neoplasm of upper-outer quadrant of right female breast: Secondary | ICD-10-CM | POA: Insufficient documentation

## 2021-03-08 DIAGNOSIS — C7951 Secondary malignant neoplasm of bone: Secondary | ICD-10-CM | POA: Insufficient documentation

## 2021-03-08 DIAGNOSIS — C773 Secondary and unspecified malignant neoplasm of axilla and upper limb lymph nodes: Secondary | ICD-10-CM | POA: Diagnosis present

## 2021-03-08 DIAGNOSIS — Z170 Estrogen receptor positive status [ER+]: Secondary | ICD-10-CM | POA: Diagnosis not present

## 2021-03-08 DIAGNOSIS — C50911 Malignant neoplasm of unspecified site of right female breast: Secondary | ICD-10-CM

## 2021-03-08 DIAGNOSIS — Z5111 Encounter for antineoplastic chemotherapy: Secondary | ICD-10-CM | POA: Diagnosis present

## 2021-03-08 DIAGNOSIS — L03112 Cellulitis of left axilla: Secondary | ICD-10-CM

## 2021-03-08 MED ORDER — CEPHALEXIN 500 MG PO CAPS: 500.0000 mg | ORAL_CAPSULE | Freq: Four times a day (QID) | ORAL | 0 refills | Status: AC

## 2021-03-08 MED ORDER — FULVESTRANT 250 MG/5ML IM SOLN
500.0000 mg | Freq: Once | INTRAMUSCULAR | Status: AC
Start: 2021-03-08 — End: 2021-03-08
  Administered 2021-03-08: 500 mg via INTRAMUSCULAR

## 2021-03-08 NOTE — Progress Notes (Signed)
Patient tolerated Faslodex injection with no complaints voiced.  Site clean and dry with no bruising or swelling noted.  No complaints of pain.  Discharged with vital signs stable and no signs or symptoms of distress noted.

## 2021-03-08 NOTE — Patient Instructions (Signed)
[place] at [facility] Discharge Instructions You received Faslodex injection today.  Return as scheduled.     Thank you for choosing [place] at [facility] to provide your oncology and hematology care.  To afford each patient quality time with our provider, please arrive at least 15 minutes before your scheduled appointment time.   If you have a lab appointment with the [facility] please come in thru the Main Entrance and check in at the main information desk.  You need to re-schedule your appointment should you arrive 10 or more minutes late.  We strive to give you quality time with our providers, and arriving late affects you and other patients whose appointments are after yours.  Also, if you no show three or more times for appointments you may be dismissed from the clinic at the providers discretion.     Again, thank you for choosing [facility].  Our hope is that these requests will decrease the amount of time that you wait before being seen by our physicians.       _____________________________________________________________  Should you have questions after your visit to [facility], please contact our office at [phone] and follow the prompts.  Our office hours are 8:00 a.m. and 4:30 p.m. Monday - Friday.  Please note that voicemails left after 4:00 p.m. may not be returned until the following business day.  We are closed weekends and major holidays.  You do have access to a nurse 24-7, just call the main number to the clinic [phone] and do not press any options, hold on the line and a nurse will answer the phone.    For prescription refill requests, have your pharmacy contact our office and allow 72 hours.    Due to Covid, you will need to wear a mask upon entering the hospital. If you do not have a mask, a mask will be given to you at the Main Entrance upon arrival. For doctor visits, patients may  have 1 support person age 60 or older with them. For treatment visits, patients can not have anyone with them due to social distancing guidelines and our immunocompromised population.

## 2021-03-08 NOTE — Progress Notes (Signed)
Patient complained of left axillary tenderness.  Noted to have a dime-sized knot in her left axilla - erythematous, indurated, tender to palpation.  No drainage or fluctuance noted.  Short-course of keflex sent to pharmacy.  Patient instructed to follow up with her PCP or go to Urgent Care if symptoms do not resolve in 3-5 days.  Go to ER if developing fever, shaking chills, or signs of systemic infection.

## 2021-03-11 ENCOUNTER — Encounter: Payer: Self-pay | Admitting: Cardiology

## 2021-03-11 ENCOUNTER — Telehealth (INDEPENDENT_AMBULATORY_CARE_PROVIDER_SITE_OTHER): Payer: Medicare Other | Admitting: Cardiology

## 2021-03-11 VITALS — Ht 60.0 in | Wt 195.0 lb

## 2021-03-11 DIAGNOSIS — I5022 Chronic systolic (congestive) heart failure: Secondary | ICD-10-CM

## 2021-03-11 DIAGNOSIS — I480 Paroxysmal atrial fibrillation: Secondary | ICD-10-CM | POA: Diagnosis not present

## 2021-03-11 DIAGNOSIS — I2510 Atherosclerotic heart disease of native coronary artery without angina pectoris: Secondary | ICD-10-CM | POA: Diagnosis not present

## 2021-03-11 NOTE — Patient Instructions (Signed)
Your physician recommends that you schedule a follow-up appointment in: 6 MONTHS WITH DR BRANCH  Your physician recommends that you continue on your current medications as directed. Please refer to the Current Medication list given to you today.  Thank you for choosing [facility]!!    

## 2021-03-11 NOTE — Progress Notes (Signed)
Virtual Visit via Telephone Note   This visit type was conducted due to national recommendations for restrictions regarding the COVID-19 Pandemic (e.g. social distancing) in an effort to limit this patient's exposure and mitigate transmission in our community.  Due to her co-morbid illnesses, this patient is at least at moderate risk for complications without adequate follow up.  This format is felt to be most appropriate for this patient at this time.  The patient did not have access to video technology/had technical difficulties with video requiring transitioning to audio format only (telephone).  All issues noted in this document were discussed and addressed.  No physical exam could be performed with this format.  Please refer to the patient's chart for her  consent to telehealth for [facility].    Date:  03/11/2021   ID:  Stacey [name], DOB [birth year], MRN [id number] The patient was identified using 2 identifiers.  Patient Location: Home Provider Location: Office/Clinic   PCP:  Fricklas, Gilliam  Cardiologist:  Carlyle Dolly, MD  Advanced Practice Provider:  No care team member to display Electrophysiologist:  None  :[id number]}   Evaluation Performed:  Follow-Up Visit  Chief Complaint:  Follow up visit  History of Present Illness:    [name] is a 75 y.o. female seen today for follow up of the following medical problems.   1. Chronic systolic HF/ICM - Jan 5397 echo LVEF 20-25%, restrictive diastolic function, mild AS, PASP 40  - 10/2017 echo LVEF 50%, dropped to 30-35% Jan 2019 and has remained decreased  - chronic left leg edema thought to be venous insufficiency  From prior cardiologist: "She is not a candidate for device therapy given her metastatic cancer and numerous comorbidities and poor long-term prognosis. She has been scheduled to establish care with the palliative care team which I think is quite  appropriate". - has been hesitant to change to entresto due to concern about cost    - no recent edmea - has not required diuretic - compliatn with meds 03/2020 LVEF 35-40%  2. CAD - notesincidate history of DES to ramus, late stentthrombosis requiring PTCA in 2006.DES to RCA in Dec 2018 - SOB on brillinta, previously changed to plavix.Has been on plavix and coumadin.  - denies any chest pains, no SOB/DOE    3. Mild aortic stenosis - Jan 2020 echo mean grad 11, AVA VTI 1.69 - 2021 mild to mod AS  4. Chronic LBBB   5. . Metastatic breast cancer - followed at [facility]. Metastatic breast cancer to the bone and renal lesion, has pulmonary nodules. On radiation. - has been seen by palliative care previously - from 03/08/20 onc note increased size of renal lesion, to f/u with urology. Mention concern for additional primary such as renal cell CA.   6. Afib - off coumadin for intrathecal pain pump through palliative pain management at [facility] - infrequent palpitations.         The patient does not have symptoms concerning for COVID-19 infection (fever, chills, cough, or new shortness of breath).    Past Medical History:  Diagnosis Date  . CAD in native artery    a. DES to ramus 2005 with late stent thrombosis 2006 tx with PTCA. 12/18 PCI/DES x1 to mRCA, EF 50-55%  . CHF (congestive heart failure) ([place])   . Chronic pain   . COPD (chronic obstructive pulmonary disease) ([place])   . Hyperlipidemia   . Hypertension   .  Left bundle [name] block   . Lymphedema   . Metastatic breast cancer ([place])    a. to bone.  . MI (myocardial infarction) ([place])   . Mild aortic stenosis 10/2017  . Morbid obesity ([place])   . PAF (paroxysmal atrial fibrillation) ([place])   . PSVT (paroxysmal supraventricular tachycardia) ([place])    a. per [facility] notes, seen on event monitor in 2014.  . Pulmonary nodules    Past Surgical History:  Procedure Laterality Date  . CORONARY STENT  INTERVENTION N/A 11/26/2017   Procedure: CORONARY STENT INTERVENTION;  Surgeon: Martinique, Peter M, MD;  Location: [place] [facility];  Service: Cardiovascular;  Laterality: N/A;  . CORONARY STENT PLACEMENT    . LEFT HEART CATH AND CORONARY ANGIOGRAPHY N/A 11/26/2017   Procedure: LEFT HEART CATH AND CORONARY ANGIOGRAPHY;  Surgeon: Martinique, Peter M, MD;  Location: [place] [facility];  Service: Cardiovascular;  Laterality: N/A;     No outpatient medications have been marked as taking for the 03/11/21 encounter (Appointment) with Arnoldo Lenis, MD.     Allergies:   Brilinta [ticagrelor], Budesonide, and Albuterol   Social History   Tobacco Use  . Smoking status: Former Smoker    Packs/day: 2.00    Years: 18.00    Pack years: 36.00    Types: Cigarettes    Quit date: 12/01/1980    Years since quitting: 40.3  . Smokeless tobacco: Never Used  Vaping Use  . Vaping Use: Never used  Substance Use Topics  . Alcohol use: No  . Drug use: Yes    Types: Oxycodone    Comment: takes methadone     Family Hx: The patient's family history includes CAD in her paternal grandmother; CAD (age of onset: 3) in her father; COPD in her sister; Heart attack in her father. There is no history of Sudden Cardiac Death.  ROS:   Please see the history of present illness.     All other systems reviewed and are negative.   Prior CV studies:   The following studies were reviewed today:  10/2017 cath  Ost Ramus to Ramus lesion is 100% stenosed.  Mid RCA lesion is 90% stenosed.  A drug-eluting stent was successfully placed using a STENT SYNERGY DES 2.5X16.  Post intervention, there is a 0% residual stenosis.  LV end diastolic pressure is normal.  1. 2 vessel obstructive CAD - 100% proximal ramus intermediate at site of prior stent. I think this is a CTO and she has good collaterals.  - 90% ulcerative mid RCA 2. Normal LVEDP 3. Successful PCI of the mid RCA with DES   10/2017  echo Study Conclusions  - Left ventricle: The cavity size was normal. There was mild concentric hypertrophy. Systolic function was normal. The estimated ejection fraction was in the range of 50% to 55%. There was an increased relative contribution of atrial contraction to ventricular filling. Doppler parameters are consistent with abnormal left ventricular relaxation (grade 1 diastolic dysfunction). - Aortic valve: There was mild stenosis. Valve area (VTI): 1.66 cm^2. Valve area (Vmax): 1.71 cm^2. Valve area (Vmean): 1.62 cm^2. - Mitral valve: Calcified annulus. Mildly thickened leaflets . - Pulmonary arteries: PA peak pressure: 32 mm Hg (S).  Jan 2019 echo Study Conclusions  - Left ventricle: The cavity size was normal. There was mild concentric hypertrophy. Systolic function was moderately to severely reduced. The estimated ejection fraction was in the range of 30% to 35%. Diffuse hypokinesis. Doppler parameters are consistent with abnormal left  ventricular relaxation (grade 1 diastolic dysfunction). - Aortic valve: Probably trileaflet; moderately thickened, severely calcified leaflets. There was mild stenosis. Peak gradient (S): 13 mm Hg. Valve area (Vmax): 1.26 cm^2. - Mitral valve: Calcified annulus. Mildly thickened leaflets . There was mild regurgitation. - Right ventricle: The cavity size was normal. Wall thickness was normal. Systolic function was normal. - Right atrium: The atrium was normal in size. - Pulmonary arteries: Systolic pressure could not be accurately estimated. - Inferior vena cava: The vessel was normal in size. - Pericardium, extracardiac: There was no pericardial effusion.  Impressions:  - Since the last study on 11/24/17 LVEF has decreased from 50% to 30-35% with diffuse hypokinesis.  Jan 2020 echo Study Conclusions  - Left ventricle: The cavity size was normal. Wall thickness was normal. Systolic  function was severely reduced. The estimated ejection fraction was in the range of 20% to 25%. Diffuse hypokinesis. Doppler parameters are consistent with restrictive physiology, indicative of decreased left ventricular diastolic compliance and/or increased left atrial pressure. Doppler parameters are consistent with high ventricular filling pressure. - Aortic valve: There was mild stenosis. Valve area (VTI): 1.69 cm^2. Valve area (Vmax): 1.73 cm^2. Valve area (Vmean): 1.74 cm^2. - Mitral valve: Calcified annulus. There was mild regurgitation. - Left atrium: The atrium was mildly dilated. - Atrial septum: No defect or patent foramen ovale was identified. - Tricuspid valve: There was mild regurgitation. - Pulmonary arteries: PA peak pressure: 40 mm Hg (S).  Labs/Other Tests and Data Reviewed:    EKG:  No ECG reviewed.  Recent Labs: 02/07/2021: BUN 22; Creatinine, Ser 0.68; Hemoglobin 11.3; Magnesium 1.9; Platelets 179; Potassium 3.8; Sodium 143 02/26/2021: ALT 14   Recent Lipid Panel No results found for: CHOL, TRIG, HDL, CHOLHDL, LDLCALC, LDLDIRECT  Wt Readings from Last 3 Encounters:  09/17/20 192 lb (87.1 kg)  09/05/20 193 lb (87.5 kg)  08/31/20 195 lb (88.5 kg)     Risk Assessment/Calculations:   n/a  Objective:    Vital Signs:   Today's Vitals   03/11/21 1513  Weight: 195 lb (88.5 kg)  Height: 5' (1.524 m)   Body mass index is 38.08 kg/m.  Normal affect. Normal speech pattern and tone. COmfortable, no apparent distress. No audible signs of sob or wheezing.   ASSESSMENT & PLAN:    1.Chronic systolic HF -no recent symptoms, continue current meds - has not been interested in entresto due to cost.   2. CAD - on extended DAPT due to history of late stent thrombosis - continue current meds  3. PAF -off coumadin since intrathecal pain pump placement at [facility], contraindicated with pump in place - no recent palpitations, continue current meds        COVID-19 Education: The signs and symptoms of COVID-19 were discussed with the patient and how to seek care for testing (follow up with PCP or arrange E-visit).  The importance of social distancing was discussed today.  Time:   Today, I have spent 18 minutes with the patient with telehealth technology discussing the above problems.     Medication Adjustments/Labs and Tests Ordered: Current medicines are reviewed at length with the patient today.  Concerns regarding medicines are outlined above.   Tests Ordered: No orders of the defined types were placed in this encounter.   Medication Changes: No orders of the defined types were placed in this encounter.   Follow Up:  In Person in 6 month(s)  Signed, Carlyle Dolly, MD  03/11/2021 12:53 PM  [facility]

## 2021-03-24 ENCOUNTER — Encounter (HOSPITAL_COMMUNITY): Payer: Self-pay | Admitting: Emergency Medicine

## 2021-03-24 ENCOUNTER — Other Ambulatory Visit: Payer: Self-pay

## 2021-03-24 ENCOUNTER — Emergency Department (HOSPITAL_COMMUNITY): Payer: Medicare Other

## 2021-03-24 ENCOUNTER — Inpatient Hospital Stay (HOSPITAL_COMMUNITY)
Admission: EM | Admit: 2021-03-24 | Discharge: 2021-04-05 | DRG: 417 | Disposition: A | Discharge status: Home Health | Payer: Medicare Other | Attending: Family Medicine | Admitting: Family Medicine

## 2021-03-24 DIAGNOSIS — Z993 Dependence on wheelchair: Secondary | ICD-10-CM

## 2021-03-24 DIAGNOSIS — I252 Old myocardial infarction: Secondary | ICD-10-CM

## 2021-03-24 DIAGNOSIS — I2510 Atherosclerotic heart disease of native coronary artery without angina pectoris: Secondary | ICD-10-CM | POA: Diagnosis present

## 2021-03-24 DIAGNOSIS — Z87891 Personal history of nicotine dependence: Secondary | ICD-10-CM

## 2021-03-24 DIAGNOSIS — Z20822 Contact with and (suspected) exposure to covid-19: Secondary | ICD-10-CM | POA: Diagnosis present

## 2021-03-24 DIAGNOSIS — Z7902 Long term (current) use of antithrombotics/antiplatelets: Secondary | ICD-10-CM

## 2021-03-24 DIAGNOSIS — I5032 Chronic diastolic (congestive) heart failure: Secondary | ICD-10-CM

## 2021-03-24 DIAGNOSIS — E782 Mixed hyperlipidemia: Secondary | ICD-10-CM | POA: Diagnosis present

## 2021-03-24 DIAGNOSIS — Z955 Presence of coronary angioplasty implant and graft: Secondary | ICD-10-CM

## 2021-03-24 DIAGNOSIS — E876 Hypokalemia: Secondary | ICD-10-CM | POA: Diagnosis present

## 2021-03-24 DIAGNOSIS — R109 Unspecified abdominal pain: Secondary | ICD-10-CM | POA: Diagnosis not present

## 2021-03-24 DIAGNOSIS — R17 Unspecified jaundice: Secondary | ICD-10-CM

## 2021-03-24 DIAGNOSIS — Z853 Personal history of malignant neoplasm of breast: Secondary | ICD-10-CM

## 2021-03-24 DIAGNOSIS — K8010 Calculus of gallbladder with chronic cholecystitis without obstruction: Secondary | ICD-10-CM | POA: Diagnosis present

## 2021-03-24 DIAGNOSIS — Z6839 Body mass index (BMI) 39.0-39.9, adult: Secondary | ICD-10-CM

## 2021-03-24 DIAGNOSIS — K8050 Calculus of bile duct without cholangitis or cholecystitis without obstruction: Secondary | ICD-10-CM

## 2021-03-24 DIAGNOSIS — D539 Nutritional anemia, unspecified: Secondary | ICD-10-CM | POA: Diagnosis present

## 2021-03-24 DIAGNOSIS — K420 Umbilical hernia with obstruction, without gangrene: Secondary | ICD-10-CM | POA: Diagnosis present

## 2021-03-24 DIAGNOSIS — I447 Left bundle-branch block, unspecified: Secondary | ICD-10-CM | POA: Diagnosis present

## 2021-03-24 DIAGNOSIS — J9621 Acute and chronic respiratory failure with hypoxia: Secondary | ICD-10-CM | POA: Diagnosis present

## 2021-03-24 DIAGNOSIS — J449 Chronic obstructive pulmonary disease, unspecified: Secondary | ICD-10-CM | POA: Diagnosis present

## 2021-03-24 DIAGNOSIS — E66812 Obesity, class 2: Secondary | ICD-10-CM

## 2021-03-24 DIAGNOSIS — K5909 Other constipation: Secondary | ICD-10-CM | POA: Diagnosis present

## 2021-03-24 DIAGNOSIS — M4856XA Collapsed vertebra, not elsewhere classified, lumbar region, initial encounter for fracture: Secondary | ICD-10-CM | POA: Diagnosis present

## 2021-03-24 DIAGNOSIS — K819 Cholecystitis, unspecified: Secondary | ICD-10-CM

## 2021-03-24 DIAGNOSIS — R748 Abnormal levels of other serum enzymes: Secondary | ICD-10-CM

## 2021-03-24 DIAGNOSIS — K8000 Calculus of gallbladder with acute cholecystitis without obstruction: Principal | ICD-10-CM | POA: Diagnosis present

## 2021-03-24 DIAGNOSIS — I110 Hypertensive heart disease with heart failure: Secondary | ICD-10-CM | POA: Diagnosis present

## 2021-03-24 DIAGNOSIS — J9622 Acute and chronic respiratory failure with hypercapnia: Secondary | ICD-10-CM | POA: Diagnosis present

## 2021-03-24 DIAGNOSIS — E669 Obesity, unspecified: Secondary | ICD-10-CM

## 2021-03-24 DIAGNOSIS — Z7982 Long term (current) use of aspirin: Secondary | ICD-10-CM

## 2021-03-24 DIAGNOSIS — R739 Hyperglycemia, unspecified: Secondary | ICD-10-CM | POA: Diagnosis present

## 2021-03-24 DIAGNOSIS — Z825 Family history of asthma and other chronic lower respiratory diseases: Secondary | ICD-10-CM

## 2021-03-24 DIAGNOSIS — C50919 Malignant neoplasm of unspecified site of unspecified female breast: Secondary | ICD-10-CM | POA: Diagnosis present

## 2021-03-24 DIAGNOSIS — R7401 Elevation of levels of liver transaminase levels: Secondary | ICD-10-CM

## 2021-03-24 DIAGNOSIS — K828 Other specified diseases of gallbladder: Secondary | ICD-10-CM | POA: Diagnosis present

## 2021-03-24 DIAGNOSIS — Z79899 Other long term (current) drug therapy: Secondary | ICD-10-CM

## 2021-03-24 DIAGNOSIS — D72829 Elevated white blood cell count, unspecified: Secondary | ICD-10-CM

## 2021-03-24 DIAGNOSIS — C7951 Secondary malignant neoplasm of bone: Secondary | ICD-10-CM | POA: Diagnosis present

## 2021-03-24 DIAGNOSIS — G894 Chronic pain syndrome: Secondary | ICD-10-CM | POA: Diagnosis present

## 2021-03-24 DIAGNOSIS — R1084 Generalized abdominal pain: Secondary | ICD-10-CM

## 2021-03-24 DIAGNOSIS — Z7401 Bed confinement status: Secondary | ICD-10-CM

## 2021-03-24 DIAGNOSIS — I358 Other nonrheumatic aortic valve disorders: Secondary | ICD-10-CM | POA: Diagnosis present

## 2021-03-24 DIAGNOSIS — I10 Essential (primary) hypertension: Secondary | ICD-10-CM | POA: Diagnosis present

## 2021-03-24 DIAGNOSIS — I480 Paroxysmal atrial fibrillation: Secondary | ICD-10-CM | POA: Diagnosis present

## 2021-03-24 DIAGNOSIS — I255 Ischemic cardiomyopathy: Secondary | ICD-10-CM | POA: Diagnosis present

## 2021-03-24 DIAGNOSIS — E785 Hyperlipidemia, unspecified: Secondary | ICD-10-CM | POA: Diagnosis present

## 2021-03-24 DIAGNOSIS — Z8249 Family history of ischemic heart disease and other diseases of the circulatory system: Secondary | ICD-10-CM

## 2021-03-24 DIAGNOSIS — K8042 Calculus of bile duct with acute cholecystitis without obstruction: Secondary | ICD-10-CM | POA: Diagnosis present

## 2021-03-24 DIAGNOSIS — I5042 Chronic combined systolic (congestive) and diastolic (congestive) heart failure: Secondary | ICD-10-CM | POA: Diagnosis present

## 2021-03-24 DIAGNOSIS — I350 Nonrheumatic aortic (valve) stenosis: Secondary | ICD-10-CM | POA: Diagnosis present

## 2021-03-24 DIAGNOSIS — Z888 Allergy status to other drugs, medicaments and biological substances status: Secondary | ICD-10-CM

## 2021-03-24 DIAGNOSIS — Z79891 Long term (current) use of opiate analgesic: Secondary | ICD-10-CM

## 2021-03-24 LAB — CBC WITH DIFFERENTIAL/PLATELET
Abs Immature Granulocytes: 0.06 10*3/uL (ref 0.00–0.07)
Basophils Absolute: 0.1 10*3/uL (ref 0.0–0.1)
Basophils Relative: 0 %
Eosinophils Absolute: 0.2 10*3/uL (ref 0.0–0.5)
Eosinophils Relative: 1 %
HCT: 37 % (ref 36.0–46.0)
Hemoglobin: 12.6 g/dL (ref 12.0–15.0)
Immature Granulocytes: 1 %
Lymphocytes Relative: 14 %
Lymphs Abs: 1.6 10*3/uL (ref 0.7–4.0)
MCH: 32.6 pg (ref 26.0–34.0)
MCHC: 34.1 g/dL (ref 30.0–36.0)
MCV: 95.9 fL (ref 80.0–100.0)
Monocytes Absolute: 0.6 10*3/uL (ref 0.1–1.0)
Monocytes Relative: 5 %
Neutro Abs: 8.9 10*3/uL — ABNORMAL HIGH (ref 1.7–7.7)
Neutrophils Relative %: 79 %
Platelets: 261 10*3/uL (ref 150–400)
RBC: 3.86 MIL/uL — ABNORMAL LOW (ref 3.87–5.11)
RDW: 13.9 % (ref 11.5–15.5)
WBC: 11.3 10*3/uL — ABNORMAL HIGH (ref 4.0–10.5)
nRBC: 0 % (ref 0.0–0.2)

## 2021-03-24 LAB — COMPREHENSIVE METABOLIC PANEL
ALT: 52 U/L — ABNORMAL HIGH (ref 0–44)
AST: 123 U/L — ABNORMAL HIGH (ref 15–41)
Albumin: 3.6 g/dL (ref 3.5–5.0)
Alkaline Phosphatase: 92 U/L (ref 38–126)
Anion gap: 12 (ref 5–15)
BUN: 24 mg/dL — ABNORMAL HIGH (ref 8–23)
CO2: 28 mmol/L (ref 22–32)
Calcium: 10 mg/dL (ref 8.9–10.3)
Chloride: 99 mmol/L (ref 98–111)
Creatinine, Ser: 0.75 mg/dL (ref 0.44–1.00)
GFR, Estimated: >60 mL/min (ref >60)
Glucose, Bld: 180 mg/dL — ABNORMAL HIGH (ref 70–99)
Potassium: 3.3 mmol/L — ABNORMAL LOW (ref 3.5–5.1)
Sodium: 139 mmol/L (ref 135–145)
Total Bilirubin: 2.5 mg/dL — ABNORMAL HIGH (ref 0.3–1.2)
Total Protein: 6.6 g/dL (ref 6.5–8.1)

## 2021-03-24 LAB — LIPASE, BLOOD: Lipase: 20 U/L (ref 11–51)

## 2021-03-24 MED ORDER — MORPHINE SULFATE (PF) 4 MG/ML IV SOLN
4.0000 mg | Freq: Once | INTRAVENOUS | Status: AC
Start: 2021-03-24 — End: 2021-03-24
  Administered 2021-03-24: 4 mg via INTRAVENOUS
  Filled 2021-03-24: qty 1

## 2021-03-24 MED ORDER — ONDANSETRON HCL 4 MG/2ML IJ SOLN
4.0000 mg | Freq: Once | INTRAMUSCULAR | Status: AC
  Administered 2021-03-24: 4 mg via INTRAVENOUS
  Filled 2021-03-24: qty 2

## 2021-03-24 MED ORDER — SODIUM CHLORIDE 0.9 % IV BOLUS
1000.0000 mL | Freq: Once | INTRAVENOUS | Status: AC
  Administered 2021-03-24: 1000 mL via INTRAVENOUS

## 2021-03-24 MED ORDER — IOHEXOL 300 MG/ML  SOLN
100.0000 mL | Freq: Once | INTRAMUSCULAR | Status: AC | PRN
  Administered 2021-03-25: 100 mL via INTRAVENOUS

## 2021-03-24 NOTE — ED Triage Notes (Signed)
Pt c/o abd and back pain that started today. Pt has pain pump implanted.

## 2021-03-24 NOTE — ED Provider Notes (Signed)
[facility] EMERGENCY DEPARTMENT Provider Note   CSN: [id number] Arrival date & time: 03/24/21  2146     History Chief Complaint  Patient presents with  . Abdominal Pain  . Back Pain    Stacey [name] is a 75 y.o. female.  Pt presents to the ED today with abdominal pain.  Pt has a hx of metastatic breast cancer and has a pain pump in place.  She said it is not helping.  She has had some nausea, but no vomiting.  No fevers.  She has been having bowel movements.          Past Medical History:  Diagnosis Date  . CAD in native artery    a. DES to ramus 2005 with late stent thrombosis 2006 tx with PTCA. 12/18 PCI/DES x1 to mRCA, EF 50-55%  . CHF (congestive heart failure) ([place])   . Chronic pain   . COPD (chronic obstructive pulmonary disease) ([place])   . Hyperlipidemia   . Hypertension   . Left bundle branch block   . Lymphedema   . Metastatic breast cancer ([place])    a. to bone.  . MI (myocardial infarction) ([place])   . Mild aortic stenosis 10/2017  . Morbid obesity ([place])   . PAF (paroxysmal atrial fibrillation) ([place])   . PSVT (paroxysmal supraventricular tachycardia) ([place])    a. per [facility] notes, seen on event monitor in 2014.  . Pulmonary nodules     Patient Active Problem List   Diagnosis Date Noted  . Rectal bleeding 08/23/2020  . Supratherapeutic INR 08/23/2020  . Left hip pain   . Pain of metastatic malignancy 06/27/2020  . Malignant neoplasm of right breast in female, estrogen receptor positive ([place])   . Goals of care, counseling/discussion   . Advanced care planning/counseling discussion   . Spinal stenosis of lumbar region   . Bone metastases ([place])   . Palliative care by specialist   . Lumbar pain 04/24/2020  . Lumbar radiculopathy 02/16/2020  . Palpitations 11/16/2019  . Dysuria   . Acute bilateral low back pain 10/02/2019  . Bipolar 2 disorder, major depressive episode ([place]) 09/01/2019  . Adjustment disorder with depressed mood 06/30/2019  . Atrial  fibrillation with rapid ventricular response ([place]) 11/14/2018  . Fall   . Obesity, Class III, BMI 40-49.9 (morbid obesity) ([place])   . Chronic respiratory failure with hypoxia ([place])   . Elevated troponin 10/07/2018  . PSVT (paroxysmal supraventricular tachycardia) ([place]) 10/07/2018  . Hyperlipidemia 10/07/2018  . COPD (chronic obstructive pulmonary disease) ([place]) 10/07/2018  . CAD in native artery 10/07/2018  . Chronic pain 10/07/2018  . Acute back pain 10/07/2018  . Acute hypoxemic respiratory failure ([place]) 09/19/2018  . Acute on chronic combined systolic and diastolic CHF (congestive heart failure) ([place]) 09/19/2018  . COPD with acute exacerbation ([place]) 09/19/2018  . Dyspnea 11/30/2017  . Non-ST elevation (NSTEMI) myocardial infarction ([place])   . Hypertension 11/23/2017  . Left bundle branch block 11/23/2017  . Acute on chronic diastolic CHF (congestive heart failure) ([place]) 11/23/2017  . CAD (coronary artery disease) 11/23/2017  . Metastatic breast cancer ([place]) 11/23/2017  . Acute respiratory failure with hypoxia ([place]) 11/23/2017  . Atypical nevus 07/29/2016  . Age-related nuclear cataract, bilateral 02/12/2016  . Anxiety and depression 01/24/2016  . Arthropathy, lower leg 09/14/2013  . Tibialis tendinitis 02/22/2013  . Plantar fasciitis 01/17/2013    Past Surgical History:  Procedure Laterality Date  . CORONARY STENT INTERVENTION N/A 11/26/2017   Procedure: CORONARY  STENT INTERVENTION;  Surgeon: Martinique, Peter M, MD;  Location: [place] [facility];  Service: Cardiovascular;  Laterality: N/A;  . CORONARY STENT PLACEMENT    . LEFT HEART CATH AND CORONARY ANGIOGRAPHY N/A 11/26/2017   Procedure: LEFT HEART CATH AND CORONARY ANGIOGRAPHY;  Surgeon: Martinique, Peter M, MD;  Location: [place] [facility];  Service: Cardiovascular;  Laterality: N/A;     OB History   No obstetric history on file.     Family History  Problem Relation Age of Onset  . CAD Father 14  . Heart attack Father    . COPD Sister   . CAD Paternal Grandmother   . Sudden Cardiac Death Neg Hx     Social History   Tobacco Use  . Smoking status: Former Smoker    Packs/day: 2.00    Years: 18.00    Pack years: 36.00    Types: Cigarettes    Quit date: 12/01/1980    Years since quitting: 40.3  . Smokeless tobacco: Never Used  Vaping Use  . Vaping Use: Never used  Substance Use Topics  . Alcohol use: No  . 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 Authorizing Provider  acetaminophen (TYLENOL) 650 MG CR tablet Take by mouth.    [provider]  aspirin EC 81 MG tablet Take 81 mg by mouth daily. Swallow whole.    [provider]  atorvastatin (LIPITOR) 80 MG tablet Take 1 tablet (80 mg total) by mouth daily. 05/28/20   Arnoldo Lenis, MD  b complex vitamins tablet Take 1 tablet by mouth every morning. 07/05/20   Florencia Reasons, MD  calcium-vitamin D (OSCAL WITH D) 500-200 MG-UNIT tablet Take 1 tablet by mouth.    [provider]  carboxymethylcellulose (REFRESH PLUS) 0.5 % SOLN Place 1 drop into both eyes in the morning, at noon, in the evening, and at bedtime.    [provider]  clopidogrel (PLAVIX) 75 MG tablet Take by mouth.    [provider]  diclofenac Sodium (VOLTAREN) 1 % GEL Apply 2 g topically 4 (four) times daily. 10/06/20 10/06/21  [provider]  DULoxetine (CYMBALTA) 30 MG capsule Take 3 capsules (90 mg total) by mouth daily. Patient taking differently: Take 30 mg by mouth daily. 07/06/20   Florencia Reasons, MD  losartan (COZAAR) 100 MG tablet Take 1 tablet (100 mg total) by mouth daily. 08/07/20   Arnoldo Lenis, MD  metoprolol succinate (TOPROL-XL) 100 MG 24 hr tablet TAKE 1 AND 1/2 TABLETS DAILY Patient taking differently: Take 150 mg by mouth daily. 03/28/20   Arnoldo Lenis, MD  morphine 5 mg/mL injection     [provider]  Multiple Vitamin (MULTIVITAMIN ADULT PO) Take by mouth.    [provider]  Multiple Vitamins-Minerals (EQ VISION FORMULA 50+ PO) Take 1 tablet by mouth every morning.    [provider]  nitroGLYCERIN (NITROSTAT) 0.4 MG SL tablet Place 1 tablet (0.4 mg total) under the tongue every 5 (five) minutes x 3 doses as needed for chest pain (if no relief after 3rd dose, proceed to the ED or call 911). 03/22/20   Arnoldo Lenis, MD  oxyCODONE (OXY IR/ROXICODONE) 5 MG immediate release tablet Take by mouth. 12/27/20   [provider]  PAIN MANAGEMENT INTRATHECAL, IT, PUMP by Intrathecal route. 02/21/21  [provider]  polyethylene glycol powder (GLYCOLAX/MIRALAX) 17 GM/SCOOP powder Take 17 g by mouth daily as needed for moderate constipation.     [provider]  pregabalin (LYRICA) 50 MG capsule Take by mouth. 01/26/21   [provider]  senna-docusate (SENOKOT-S) 8.6-50 MG tablet 1 tablet once daily    [provider]    Allergies    Brilinta [ticagrelor], Budesonide, and Albuterol  Review of Systems   Review of Systems  Gastrointestinal: Positive for abdominal pain.  All other systems reviewed and are negative.   Physical Exam Updated Vital Signs BP (!) 147/76   Pulse 73   Temp 98 F (36.7 C) (Oral)   Resp 16   Ht 5' (1.524 m)   Wt 88.5 kg   SpO2 95%   BMI 38.08 kg/m   Physical Exam Vitals and nursing note reviewed.  Constitutional:      Appearance: She is well-developed. She is obese.  HENT:     Head: Normocephalic and atraumatic.     Mouth/Throat:     Mouth: Mucous membranes are moist.     Pharynx: Oropharynx is clear.  Eyes:     Extraocular Movements: Extraocular movements intact.     Pupils: Pupils are equal, round, and reactive to light.  Cardiovascular:     Rate and Rhythm: Normal rate and regular rhythm.  Pulmonary:     Effort: Pulmonary effort is normal.     Breath sounds: Normal  breath sounds.  Abdominal:     General: Abdomen is flat. Bowel sounds are normal.     Palpations: Abdomen is soft.     Tenderness: There is generalized abdominal tenderness.  Skin:    General: Skin is warm.     Capillary Refill: Capillary refill takes less than 2 seconds.  Neurological:     General: No focal deficit present.     Mental Status: She is alert and oriented to person, place, and time.  Psychiatric:        Mood and Affect: Mood normal.        Behavior: Behavior normal.     ED Results / Procedures / Treatments   Labs (all labs ordered are listed, but only abnormal results are displayed) Labs Reviewed  CBC WITH DIFFERENTIAL/PLATELET - Abnormal; Notable for the following components:      Result Value   WBC 11.3 (*)    RBC 3.86 (*)    Neutro Abs 8.9 (*)    All other components within normal limits  COMPREHENSIVE METABOLIC PANEL - Abnormal; Notable for the following components:   Potassium 3.3 (*)    Glucose, Bld 180 (*)    BUN 24 (*)    AST 123 (*)    ALT 52 (*)    Total Bilirubin 2.5 (*)    All other components within normal limits  LIPASE, BLOOD  URINALYSIS, ROUTINE W REFLEX MICROSCOPIC    EKG None  Radiology No results found.  Procedures Procedures   Medications Ordered in ED Medications  iohexol (OMNIPAQUE) 300 MG/ML solution 100 mL (has no administration in time range)  sodium chloride 0.9 % bolus 1,000 mL (1,000 mLs Intravenous New Bag/Given 03/24/21 2222)  morphine 4 MG/ML injection 4 mg (4 mg Intravenous Given 03/24/21 2226)  ondansetron (ZOFRAN) injection 4 mg (4 mg Intravenous Given 03/24/21 2223)    ED Course  I have reviewed the triage vital signs and the nursing notes.  Pertinent labs & imaging results that were available during my care  of the patient were reviewed by me and considered in my medical decision making (see chart for details).    MDM Rules/Calculators/A&P                          Pt labs stable.  She is signed out to Dr.  Karle Starch at shift change.  Final Clinical Impression(s) / ED Diagnoses Final diagnoses:  Generalized abdominal pain    Rx / DC Orders ED Discharge Orders    None       Isla Pence, MD 03/24/21 2342

## 2021-03-24 NOTE — ED Notes (Signed)
MSE signature not obtained due to EDP being present during triage. 

## 2021-03-25 ENCOUNTER — Encounter (HOSPITAL_COMMUNITY): Payer: Self-pay | Admitting: Internal Medicine

## 2021-03-25 ENCOUNTER — Inpatient Hospital Stay (HOSPITAL_COMMUNITY): Payer: Medicare Other

## 2021-03-25 DIAGNOSIS — I447 Left bundle-branch block, unspecified: Secondary | ICD-10-CM | POA: Diagnosis present

## 2021-03-25 DIAGNOSIS — D72829 Elevated white blood cell count, unspecified: Secondary | ICD-10-CM

## 2021-03-25 DIAGNOSIS — M4856XA Collapsed vertebra, not elsewhere classified, lumbar region, initial encounter for fracture: Secondary | ICD-10-CM | POA: Diagnosis not present

## 2021-03-25 DIAGNOSIS — I2510 Atherosclerotic heart disease of native coronary artery without angina pectoris: Secondary | ICD-10-CM | POA: Diagnosis present

## 2021-03-25 DIAGNOSIS — Z955 Presence of coronary angioplasty implant and graft: Secondary | ICD-10-CM | POA: Diagnosis not present

## 2021-03-25 DIAGNOSIS — K819 Cholecystitis, unspecified: Secondary | ICD-10-CM | POA: Diagnosis not present

## 2021-03-25 DIAGNOSIS — E876 Hypokalemia: Secondary | ICD-10-CM

## 2021-03-25 DIAGNOSIS — R1084 Generalized abdominal pain: Secondary | ICD-10-CM | POA: Diagnosis not present

## 2021-03-25 DIAGNOSIS — R109 Unspecified abdominal pain: Secondary | ICD-10-CM | POA: Diagnosis present

## 2021-03-25 DIAGNOSIS — C7951 Secondary malignant neoplasm of bone: Secondary | ICD-10-CM | POA: Diagnosis not present

## 2021-03-25 DIAGNOSIS — R739 Hyperglycemia, unspecified: Secondary | ICD-10-CM | POA: Diagnosis present

## 2021-03-25 DIAGNOSIS — I252 Old myocardial infarction: Secondary | ICD-10-CM | POA: Diagnosis not present

## 2021-03-25 DIAGNOSIS — K8001 Calculus of gallbladder with acute cholecystitis with obstruction: Secondary | ICD-10-CM

## 2021-03-25 DIAGNOSIS — K8050 Calculus of bile duct without cholangitis or cholecystitis without obstruction: Secondary | ICD-10-CM | POA: Diagnosis not present

## 2021-03-25 DIAGNOSIS — E785 Hyperlipidemia, unspecified: Secondary | ICD-10-CM | POA: Diagnosis present

## 2021-03-25 DIAGNOSIS — E669 Obesity, unspecified: Secondary | ICD-10-CM | POA: Diagnosis not present

## 2021-03-25 DIAGNOSIS — K8042 Calculus of bile duct with acute cholecystitis without obstruction: Secondary | ICD-10-CM | POA: Diagnosis not present

## 2021-03-25 DIAGNOSIS — Z01818 Encounter for other preprocedural examination: Secondary | ICD-10-CM

## 2021-03-25 DIAGNOSIS — K420 Umbilical hernia with obstruction, without gangrene: Secondary | ICD-10-CM | POA: Diagnosis not present

## 2021-03-25 DIAGNOSIS — E782 Mixed hyperlipidemia: Secondary | ICD-10-CM

## 2021-03-25 DIAGNOSIS — Z8249 Family history of ischemic heart disease and other diseases of the circulatory system: Secondary | ICD-10-CM | POA: Diagnosis not present

## 2021-03-25 DIAGNOSIS — C50919 Malignant neoplasm of unspecified site of unspecified female breast: Secondary | ICD-10-CM

## 2021-03-25 DIAGNOSIS — K8000 Calculus of gallbladder with acute cholecystitis without obstruction: Principal | ICD-10-CM

## 2021-03-25 DIAGNOSIS — K8010 Calculus of gallbladder with chronic cholecystitis without obstruction: Secondary | ICD-10-CM | POA: Diagnosis not present

## 2021-03-25 DIAGNOSIS — I5042 Chronic combined systolic (congestive) and diastolic (congestive) heart failure: Secondary | ICD-10-CM

## 2021-03-25 DIAGNOSIS — Z20822 Contact with and (suspected) exposure to covid-19: Secondary | ICD-10-CM | POA: Diagnosis not present

## 2021-03-25 DIAGNOSIS — J9621 Acute and chronic respiratory failure with hypoxia: Secondary | ICD-10-CM | POA: Diagnosis not present

## 2021-03-25 DIAGNOSIS — J9622 Acute and chronic respiratory failure with hypercapnia: Secondary | ICD-10-CM | POA: Diagnosis not present

## 2021-03-25 DIAGNOSIS — I5032 Chronic diastolic (congestive) heart failure: Secondary | ICD-10-CM

## 2021-03-25 DIAGNOSIS — R17 Unspecified jaundice: Secondary | ICD-10-CM | POA: Diagnosis not present

## 2021-03-25 DIAGNOSIS — R748 Abnormal levels of other serum enzymes: Secondary | ICD-10-CM

## 2021-03-25 DIAGNOSIS — J449 Chronic obstructive pulmonary disease, unspecified: Secondary | ICD-10-CM | POA: Diagnosis present

## 2021-03-25 DIAGNOSIS — R7401 Elevation of levels of liver transaminase levels: Secondary | ICD-10-CM | POA: Diagnosis present

## 2021-03-25 DIAGNOSIS — Z853 Personal history of malignant neoplasm of breast: Secondary | ICD-10-CM | POA: Diagnosis not present

## 2021-03-25 DIAGNOSIS — I110 Hypertensive heart disease with heart failure: Secondary | ICD-10-CM | POA: Diagnosis present

## 2021-03-25 LAB — URINALYSIS, ROUTINE W REFLEX MICROSCOPIC
Bacteria, UA: NONE SEEN
Bilirubin Urine: NEGATIVE
Glucose, UA: 50 mg/dL — AB
Hgb urine dipstick: NEGATIVE
Ketones, ur: 5 mg/dL — AB
Leukocytes,Ua: NEGATIVE
Nitrite: NEGATIVE
Protein, ur: 100 mg/dL — AB
Specific Gravity, Urine: 1.038 — ABNORMAL HIGH (ref 1.005–1.030)
pH: 5 (ref 5.0–8.0)

## 2021-03-25 LAB — COMPREHENSIVE METABOLIC PANEL
ALT: 259 U/L — ABNORMAL HIGH (ref 0–44)
AST: 517 U/L — ABNORMAL HIGH (ref 15–41)
Albumin: 2.9 g/dL — ABNORMAL LOW (ref 3.5–5.0)
Alkaline Phosphatase: 99 U/L (ref 38–126)
Anion gap: 9 (ref 5–15)
BUN: 22 mg/dL (ref 8–23)
CO2: 32 mmol/L (ref 22–32)
Calcium: 9.4 mg/dL (ref 8.9–10.3)
Chloride: 101 mmol/L (ref 98–111)
Creatinine, Ser: 0.76 mg/dL (ref 0.44–1.00)
GFR, Estimated: >60 mL/min (ref >60)
Glucose, Bld: 134 mg/dL — ABNORMAL HIGH (ref 70–99)
Potassium: 3.6 mmol/L (ref 3.5–5.1)
Sodium: 142 mmol/L (ref 135–145)
Total Bilirubin: 1.6 mg/dL — ABNORMAL HIGH (ref 0.3–1.2)
Total Protein: 5.4 g/dL — ABNORMAL LOW (ref 6.5–8.1)

## 2021-03-25 LAB — RESP PANEL BY RT-PCR (FLU A&B, COVID) ARPGX2
Influenza A by PCR: NEGATIVE
Influenza B by PCR: NEGATIVE
SARS Coronavirus 2 by RT PCR: NEGATIVE

## 2021-03-25 LAB — CBC
HCT: 33.2 % — ABNORMAL LOW (ref 36.0–46.0)
Hemoglobin: 10.8 g/dL — ABNORMAL LOW (ref 12.0–15.0)
MCH: 32.7 pg (ref 26.0–34.0)
MCHC: 32.5 g/dL (ref 30.0–36.0)
MCV: 100.6 fL — ABNORMAL HIGH (ref 80.0–100.0)
Platelets: 202 10*3/uL (ref 150–400)
RBC: 3.3 MIL/uL — ABNORMAL LOW (ref 3.87–5.11)
RDW: 14.1 % (ref 11.5–15.5)
WBC: 4.7 10*3/uL (ref 4.0–10.5)
nRBC: 0 % (ref 0.0–0.2)

## 2021-03-25 LAB — HEMOGLOBIN A1C
Hgb A1c MFr Bld: 5.5 % (ref 4.8–5.6)
Mean Plasma Glucose: 111.15 mg/dL

## 2021-03-25 LAB — ECHOCARDIOGRAM COMPLETE
AR max vel: 1.1 cm2
AV Area VTI: 1.09 cm2
AV Area mean vel: 1.16 cm2
AV Mean grad: 10.5 mmHg
AV Peak grad: 18.1 mmHg
Ao pk vel: 2.13 m/s
Area-P 1/2: 2.68 cm2
Height: 60 in
S' Lateral: 3.18 cm
Weight: 3202.84 oz

## 2021-03-25 LAB — PROTIME-INR
INR: 1 (ref 0.8–1.2)
Prothrombin Time: 13.4 seconds (ref 11.4–15.2)

## 2021-03-25 LAB — PHOSPHORUS: Phosphorus: 4.6 mg/dL (ref 2.5–4.6)

## 2021-03-25 LAB — MAGNESIUM: Magnesium: 1.7 mg/dL (ref 1.7–2.4)

## 2021-03-25 LAB — APTT: aPTT: 26 seconds (ref 24–36)

## 2021-03-25 MED ORDER — SODIUM CHLORIDE 0.9 % IV SOLN: Freq: Once | INTRAVENOUS | Status: AC

## 2021-03-25 MED ORDER — ONDANSETRON HCL 4 MG/2ML IJ SOLN
4.0000 mg | Freq: Four times a day (QID) | INTRAMUSCULAR | Status: DC | PRN
  Administered 2021-03-28: 4 mg via INTRAVENOUS

## 2021-03-25 MED ORDER — MAGNESIUM SULFATE 2 GM/50ML IV SOLN
2.0000 g | Freq: Once | INTRAVENOUS | Status: AC
  Administered 2021-03-25: 2 g via INTRAVENOUS
  Filled 2021-03-25: qty 50

## 2021-03-25 MED ORDER — ASPIRIN EC 81 MG PO TBEC
81.0000 mg | DELAYED_RELEASE_TABLET | Freq: Every day | ORAL | Status: DC
  Administered 2021-03-25 – 2021-03-27 (×3): 81 mg via ORAL
  Filled 2021-03-25 (×4): qty 1

## 2021-03-25 MED ORDER — POTASSIUM CHLORIDE 10 MEQ/100ML IV SOLN
10.0000 meq | INTRAVENOUS | Status: AC
  Administered 2021-03-25: 10 meq via INTRAVENOUS
  Filled 2021-03-25: qty 100

## 2021-03-25 MED ORDER — NITROGLYCERIN 0.4 MG SL SUBL: 0.4000 mg | SUBLINGUAL_TABLET | SUBLINGUAL | Status: DC | PRN

## 2021-03-25 MED ORDER — MORPHINE SULFATE (PF) 2 MG/ML IV SOLN
2.0000 mg | INTRAVENOUS | Status: DC | PRN
  Administered 2021-03-26 – 2021-03-27 (×4): 2 mg via INTRAVENOUS
  Filled 2021-03-25 (×4): qty 1

## 2021-03-25 MED ORDER — ATORVASTATIN CALCIUM 40 MG PO TABS
80.0000 mg | ORAL_TABLET | Freq: Every day | ORAL | Status: DC
  Administered 2021-03-25: 80 mg via ORAL
  Filled 2021-03-25 (×2): qty 2

## 2021-03-25 MED ORDER — METOPROLOL SUCCINATE ER 50 MG PO TB24
150.0000 mg | ORAL_TABLET | Freq: Every day | ORAL | Status: DC
  Administered 2021-03-25 – 2021-03-29 (×5): 150 mg via ORAL
  Filled 2021-03-25 (×6): qty 3

## 2021-03-25 MED ORDER — PREGABALIN 50 MG PO CAPS
50.0000 mg | ORAL_CAPSULE | Freq: Three times a day (TID) | ORAL | Status: DC
  Administered 2021-03-25 – 2021-04-05 (×32): 50 mg via ORAL
  Filled 2021-03-25: qty 2
  Filled 2021-03-25 (×13): qty 1
  Filled 2021-03-25: qty 2
  Filled 2021-03-25: qty 1
  Filled 2021-03-25: qty 2
  Filled 2021-03-25 (×9): qty 1
  Filled 2021-03-25: qty 2
  Filled 2021-03-25 (×7): qty 1

## 2021-03-25 MED ORDER — PIPERACILLIN-TAZOBACTAM 3.375 G IVPB 30 MIN: 3.3750 g | Freq: Four times a day (QID) | INTRAVENOUS | Status: DC

## 2021-03-25 MED ORDER — PIPERACILLIN-TAZOBACTAM 3.375 G IVPB
3.3750 g | Freq: Three times a day (TID) | INTRAVENOUS | Status: DC
  Administered 2021-03-25 – 2021-03-29 (×12): 3.375 g via INTRAVENOUS
  Filled 2021-03-25 (×12): qty 50

## 2021-03-25 MED ORDER — SODIUM CHLORIDE 0.9 % IV SOLN
1.0000 g | Freq: Once | INTRAVENOUS | Status: AC
  Administered 2021-03-25: 1 g via INTRAVENOUS
  Filled 2021-03-25: qty 10

## 2021-03-25 NOTE — ED Notes (Signed)
Patient transported to CT 

## 2021-03-25 NOTE — Progress Notes (Signed)
  Echocardiogram 2D Echocardiogram has been performed.  Stacey [name] 03/25/2021, 12:38 PM

## 2021-03-25 NOTE — H&P (Signed)
History and Physical  Stacey [name] OHY:[id number] DOB: 11/04/1946 DOA: 03/24/2021  Referring physician: Truddie Hidden, MD PCP: Sela Hilding  Patient coming from: Home  Chief Complaint: Abdominal pain  HPI: Stacey [name] is a 75 y.o. female with medical history significant for metastatic breast cancer with a pain pump in place, hyperlipidemia, hypertension, CAD who presents to the emergency department due to abdominal pain which started yesterday (4/24) in the evening.  Pain was diffuse and it was rated to be greater than 10/10 on pain scale, pain was without aggravating/aggravating factor, it was associated with some nausea but no vomiting.  She denies fever, chills, and chest pain, shortness of breath.  ED Course:  In the emergency department, initial BP was 204/107, this eventually improved to 110/63, other vital signs were normal work-up in the ED showed leukocytosis, hypokalemia, hyperglycemia, elevated transaminases and T bili.  Influenza A, B and SARS coronavirus 2 was negative. CT abdomen and pelvis with contrast showed cholelithiasis with changes of acute cholecystitis. Bile duct dilatation with probable stones in the distal common bile duct. She was treated with IV ceftriaxone, IV morphine was given and patient was provided with IV hydration. General surgery (Dr. Arnoldo Morale) was consulted by ED physician and recommended GI consult to consider ERCP before surgical consideration.  Hospitalist was asked to admit patient for further evaluation and management.  Review of Systems: Constitutional: Negative for chills and fever.  HENT: Negative for ear pain and sore throat.   Eyes: Negative for pain and visual disturbance.  Respiratory: Negative for cough, chest tightness and shortness of breath.   Cardiovascular: Negative for chest pain and palpitations.  Gastrointestinal: Positive for abdominal pain and negative for vomiting.  Endocrine: Negative for polyphagia and  polyuria.  Genitourinary: Negative for decreased urine volume, dysuria Musculoskeletal: Negative for arthralgias and back pain.  Skin: Negative for color change and rash.  Allergic/Immunologic: Negative for immunocompromised state.  Neurological: Negative for tremors, syncope, speech difficulty, weakness, light-headedness and headaches.  Hematological: Does not bruise/bleed easily.  All other systems reviewed and are negative  Past Medical History:  Diagnosis Date  . CAD in native artery    a. DES to ramus 2005 with late stent thrombosis 2006 tx with PTCA. 12/18 PCI/DES x1 to mRCA, EF 50-55%  . CHF (congestive heart failure) ([place])   . Chronic pain   . COPD (chronic obstructive pulmonary disease) ([place])   . Hyperlipidemia   . Hypertension   . Left bundle branch block   . Lymphedema   . Metastatic breast cancer ([place])    a. to bone.  . MI (myocardial infarction) ([place])   . Mild aortic stenosis 10/2017  . Morbid obesity ([place])   . PAF (paroxysmal atrial fibrillation) ([place])   . PSVT (paroxysmal supraventricular tachycardia) ([place])    a. per [facility] notes, seen on event monitor in 2014.  . Pulmonary nodules    Past Surgical History:  Procedure Laterality Date  . CORONARY STENT INTERVENTION N/A 11/26/2017   Procedure: CORONARY STENT INTERVENTION;  Surgeon: Martinique, Peter M, MD;  Location: [place] [facility];  Service: Cardiovascular;  Laterality: N/A;  . CORONARY STENT PLACEMENT    . LEFT HEART CATH AND CORONARY ANGIOGRAPHY N/A 11/26/2017   Procedure: LEFT HEART CATH AND CORONARY ANGIOGRAPHY;  Surgeon: Martinique, Peter M, MD;  Location: [place] [facility];  Service: Cardiovascular;  Laterality: N/A;    Social History:  reports that she quit smoking about 40 years ago. Her smoking use included  cigarettes. She has a 36.00 pack-year smoking history. She has never used smokeless tobacco. She reports current drug use. Drug: Oxycodone. She reports that she does not drink alcohol.   Allergies   Allergen Reactions  . Brilinta [Ticagrelor] Diarrhea and Nausea Only    Nausea and severe diarrhea- general weakness. Patient says does not want to take again  . Budesonide Palpitations  . Albuterol Other (See Comments)    Heart racing     Family History  Problem Relation Age of Onset  . CAD Father 19  . Heart attack Father   . COPD Sister   . CAD Paternal Grandmother   . Sudden Cardiac Death Neg Hx      Prior to Admission medications   Medication Sig Start Date End Date Taking? Authorizing Provider  acetaminophen (TYLENOL) 650 MG CR tablet Take by mouth.    [provider]  aspirin EC 81 MG tablet Take 81 mg by mouth daily. Swallow whole.    [provider]  atorvastatin (LIPITOR) 80 MG tablet Take 1 tablet (80 mg total) by mouth daily. 05/28/20   Arnoldo Lenis, MD  b complex vitamins tablet Take 1 tablet by mouth every morning. 07/05/20   Florencia Reasons, MD  calcium-vitamin D (OSCAL WITH D) 500-200 MG-UNIT tablet Take 1 tablet by mouth.    [provider]  carboxymethylcellulose (REFRESH PLUS) 0.5 % SOLN Place 1 drop into both eyes in the morning, at noon, in the evening, and at bedtime.    [provider]  clopidogrel (PLAVIX) 75 MG tablet Take by mouth.    [provider]  diclofenac Sodium (VOLTAREN) 1 % GEL Apply 2 g topically 4 (four) times daily. 10/06/20 10/06/21  [provider]  DULoxetine (CYMBALTA) 30 MG capsule Take 3 capsules (90 mg total) by mouth daily. Patient taking differently: Take 30 mg by mouth daily. 07/06/20   Florencia Reasons, MD  losartan (COZAAR) 100 MG tablet Take 1 tablet (100 mg total) by mouth daily. 08/07/20   Arnoldo Lenis, MD  metoprolol succinate (TOPROL-XL) 100 MG 24 hr tablet TAKE 1 AND 1/2 TABLETS DAILY Patient taking differently: Take 150 mg by mouth daily. 03/28/20   Arnoldo Lenis, MD  morphine 5 mg/mL injection     [provider]  Multiple Vitamin (MULTIVITAMIN ADULT PO) Take by mouth.     [provider]  Multiple Vitamins-Minerals (EQ VISION FORMULA 50+ PO) Take 1 tablet by mouth every morning.    [provider]  nitroGLYCERIN (NITROSTAT) 0.4 MG SL tablet Place 1 tablet (0.4 mg total) under the tongue every 5 (five) minutes x 3 doses as needed for chest pain (if no relief after 3rd dose, proceed to the ED or call 911). 03/22/20   Arnoldo Lenis, MD  oxyCODONE (OXY IR/ROXICODONE) 5 MG immediate release tablet Take by mouth. 12/27/20   [provider]  PAIN MANAGEMENT INTRATHECAL, IT, PUMP by Intrathecal route. 02/21/21   [provider]  polyethylene glycol powder (GLYCOLAX/MIRALAX) 17 GM/SCOOP powder Take 17 g by mouth daily as needed for moderate constipation.     [provider]  pregabalin (LYRICA) 50 MG capsule Take by mouth. 01/26/21   [provider]  senna-docusate (SENOKOT-S) 8.6-50 MG tablet 1 tablet once daily    [provider]    Physical Exam: BP 138/62   Pulse 73   Temp 98 F (36.7 C) (Oral)   Resp 16   Ht 5' (1.524 m)  Wt 88.5 kg   SpO2 93%   BMI 38.08 kg/m   . General: 75 y.o. year-old female well developed and obese in no acute distress.  Alert and oriented x3. Marland Kitchen HEENT: NCAT, EOMI . Neck: Supple, trachea medial . Cardiovascular: Regular rate and rhythm with no rubs or gallops.  No thyromegaly or JVD noted.  No lower extremity edema. 2/4 pulses in all 4 extremities. Marland Kitchen Respiratory: Clear to auscultation with no wheezes or rales. Good inspiratory effort. . Abdomen: Soft, diffuse tenderness to palpation without guarding.  Normal bowel sounds x4 quadrants. . Muskuloskeletal: No cyanosis, clubbing or edema noted bilaterally . Neuro: CN II-XII intact, sensation, reflexes intact . Skin: No ulcerative lesions noted or rashes . Psychiatry: Judgement and insight appear normal. Mood is appropriate for condition and setting          Labs on Admission:  Basic Metabolic Panel: Recent Labs  Lab  03/24/21 2201  NA 139  K 3.3*  CL 99  CO2 28  GLUCOSE 180*  BUN 24*  CREATININE 0.75  CALCIUM 10.0   Liver Function Tests: Recent Labs  Lab 03/24/21 2201  AST 123*  ALT 52*  ALKPHOS 92  BILITOT 2.5*  PROT 6.6  ALBUMIN 3.6   Recent Labs  Lab 03/24/21 2201  LIPASE 20   No results for input(s): AMMONIA in the last 168 hours. CBC: Recent Labs  Lab 03/24/21 2201  WBC 11.3*  NEUTROABS 8.9*  HGB 12.6  HCT 37.0  MCV 95.9  PLT 261   Cardiac Enzymes: No results for input(s): CKTOTAL, CKMB, CKMBINDEX, TROPONINI in the last 168 hours.  BNP (last 3 results) No results for input(s): BNP in the last 8760 hours.  ProBNP (last 3 results) No results for input(s): PROBNP in the last 8760 hours.  CBG: No results for input(s): GLUCAP in the last 168 hours.  Radiological Exams on Admission: CT ABDOMEN PELVIS W CONTRAST  Result Date: 03/25/2021 CLINICAL DATA:  Acute abdominal pain. History of MI, hypertension, congestive heart failure, breast cancer. EXAM: CT ABDOMEN AND PELVIS WITH CONTRAST TECHNIQUE: Multidetector CT imaging of the abdomen and pelvis was performed using the standard protocol following bolus administration of intravenous contrast. CONTRAST:  OMNIPAQUE IOHEXOL 300 MG/ML  SOLN COMPARISON:  02/19/2021 FINDINGS: Lower chest: Lung bases are clear. Hepatobiliary: Gallbladder wall thickening and edema with infiltration into the pericholecystic fat. Small stones layering in the gallbladder. Extrahepatic bile duct dilatation with suggestion of stones in the distal common bile duct. Moderate intrahepatic bile duct dilatation. No focal liver lesions. Small amount of perihepatic ascites. Pancreas: Unremarkable. No pancreatic ductal dilatation or surrounding inflammatory changes. Spleen: Normal in size without focal abnormality. Adrenals/Urinary Tract: No adrenal gland nodules. Complex hypoenhancing lesion demonstrated in the left kidney measuring 1.9 cm diameter. This may  represent a renal cell carcinoma. Similar appearance to previous study. No hydronephrosis or hydroureter. Bladder wall is thickened, probably due to under distention although cystitis could also have this appearance. Stomach/Bowel: Stomach, small bowel, and colon are not abnormally distended. Scattered colonic diverticula without evidence of diverticulitis. Infiltration and wall thickening around the proximal duodenum probably is referred inflammation from the gallbladder. Duodenitis could also be present. Appendix is not identified. Vascular/Lymphatic: Aortic atherosclerosis. No enlarged abdominal or pelvic lymph nodes. Reproductive: Uterus is surgically absent. No abnormal adnexal masses. Other: No free air in the abdomen. Moderate-sized periumbilical hernia containing fat. Musculoskeletal: Degenerative changes in the spine. Lumbar scoliosis convex towards the left. Sclerotic bone lesions consistent with metastatic  disease and vertebral compression of L2. IMPRESSION: 1. Cholelithiasis with changes of acute cholecystitis. Bile duct dilatation with probable stones in the distal common bile duct. 2. Infiltration and wall thickening around the proximal duodenum probably is referred inflammation from the gallbladder. Duodenitis could also have this appearance. 3. Complex hypoenhancing lesion in the left kidney may represent a renal cell carcinoma. No change since prior study. 4. Sclerotic bone lesions consistent with metastatic disease. Compression of L2. 5. Moderate-sized periumbilical hernia containing fat. 6. Aortic atherosclerosis. Aortic Atherosclerosis (ICD10-I70.0). Electronically Signed   By: Lucienne Capers M.D.   On: 03/25/2021 00:50    EKG: I independently viewed the EKG done and my findings are as followed: No EKG was done in the ED   Assessment/Plan Present on Admission: . Cholecystitis, acute with cholelithiasis . Metastatic breast cancer ([place]) . Hypertension . Hyperlipidemia . CAD (coronary  artery disease)  Principal Problem:   Cholecystitis, acute with cholelithiasis Active Problems:   Hypertension   CAD (coronary artery disease)   Metastatic breast cancer ([facility])   Abnormal transaminases   Hyperlipidemia   Abdominal pain   Leukocytosis   Hypokalemia   Hyperglycemia   Abdominal pain possibly secondary to acute cholelithiasis with possible acute cholecystitis CT abdomen and pelvis with contrast showed cholelithiasis with changes of acute cholecystitis Continue IV NS at 75 mLs/Hr Continue IV Zosyn 3.375 q.6h Continue IV morphine 2 mg q.4h p.r.n. for moderate to severe pain Continue IV Zofran p.r.n. Continue npo GI will be consulted in the morning for possible MRCP/ERCP and we shall await further recommendation General surgery was consulted by ED physician and recommended consulting services with GI prior to considering surgical intervention  Abnormal transaminases in the setting of above AST 123, ALT 52, continue to monitor liver enzymes  Hypokalemia K+ 3.3, this will be replenished  Leukocytosis possibly reactive WBC 11.3; continue IV Zosyn as described for above for cholecystitis Continue to monitor WBC with morning labs  Hyperglycemia with no known history of T2DM CBG 180; hemoglobin A1c will be checked  CAD Continue aspirin, nitroglycerin Lipitor and Toprol  Metastatic breast cancer Continue pain pump Continue IV morphine 2 mg every 4 hours as needed  Essential hypertension Continue Toprol  Hyperlipidemia Continue atorvastatin  Obesity (BMI 38.08) Patient will be counseled on diet and lifestyle   DVT prophylaxis: SCDs  Code Status: Full code  Family Communication: None at bedside  Disposition Plan:  Patient is from:                        home Anticipated DC to:                   SNF or family members home Anticipated DC date:               2-3 days Anticipated DC barriers:         patient was stable to be discharged at this time due to  acute cholelithiasis with cholecystitis pending GI consult   Consults called: Gastroenterology  Admission status: Inpatient   Bernadette Hoit MD [facility]  03/25/2021, 3:10 AM

## 2021-03-25 NOTE — ED Provider Notes (Signed)
Care of the patient assumed at the change of shift. Here for abdominal pain, pending CT.  Physical Exam  BP 138/62   Pulse 73   Temp 98 F (36.7 C) (Oral)   Resp 16   Ht 5' (1.524 m)   Wt 88.5 kg   SpO2 93%   BMI 38.08 kg/m   Physical Exam Abd diffusely tender, no focal guarding in RUQ ED Course/Procedures     Procedures  MDM  Patient's labs and imaging were reviewed, mild leukocytosis, increased AST/ALT and Bili from baseline. CT is concerning for acute cholecystitis with choledocholithiasis. I discussed with Dr. Arnoldo Morale on call for surgery who requests patient be admitted to Hospitalist, IV abx initiated and will need to see GI tomorrow to consider ERCP before consideration of cholecystectomy.   1:55 AM Spoke with Dr. Josephine Cables, Hospitalist, who will admit.        Truddie Hidden, MD 03/25/21 [phone]

## 2021-03-25 NOTE — Progress Notes (Addendum)
PROGRESS NOTE  Stacey [name] ZOX:[id number] DOB: 1946/04/07 DOA: 03/24/2021 PCP: Sela Hilding  Brief History:  75 year old female with a history of coronary artery disease status post DES to the RCA December 8119, diastolic CHF, COPD, hypertension, metastatic breast cancer, hyperlipidemia, PSVT, chronic pain syndrome status post intrathecal pump placement presenting with abdominal pain that began on 03/24/2021 that was severe rating 10/10 diffuse in nature.  The patient has subjective fevers and chills.  She had nausea without emesis.  She denies any diarrhea.  She denies any headache, cough, hemoptysis, chest pain, worsening shortness of breath, hematochezia, melena, dysuria, hematuria. In the emergency department, the patient was afebrile hemodynamically stable with oxygen saturation 100% on 3 L.  CT of the abdomen and pelvis showed gallbladder wall thickening and edema with infiltration into the pericholecystic fat.  There was extrahepatic ductal dilatation with suggestion of stones in the distal CBD and moderate intrahepatic biliary ductal dilatation.  There was infiltration and thickening around the proximal duodenum probably referred from the gallbladder.  The patient was started on Zosyn.  GI and general surgery were consulted to assist.  Assessment/Plan: Acute cholecystitis -General surgery consult -Continue IV Zosyn -Judicious IV fluids -Judicious opioids for pain control  Transaminasemia with hyperbilirubinemia -GI consulted -May need ERCP/MRCP  Chronic systolic and diastolic CHF -Appears clinically euvolemic -04/18/2020 echo EF 35-40%, G1DD, mild to moderate AAS, mild MR -Continue metoprolol succinate  Chronic respiratory failure with hypoxia -Patient is normally on 1 L nasal cannula at nighttime -Monitor clinically -Incentive spirometry  Coronary artery disease -No chest pain presently -Continue aspirin -Continue metoprolol succinate  Chronic pain  syndrome -Patient has intrathecal morphine pump infusing 5.1 mg per 24-hour. -Continue home dose oxycodone and Lyrica  Metastatic breast cancer to the bone -02/19/2021 bone scan--negative for bone metastasis -Outpatient follow-up with Dr. Delton Coombes  COPD -Stable without exacerbation  Chronic L1/L2 compression fracture -Continue home opioid regimen  Essential hypertension -Continue metoprolol succinate   Hyperlipidemia -Holding Lipitor secondary to elevated LFTs  Hypokalemia -Magnesium 1.7  Macrocytic anemia -Check B12 and folate -baseline Hgb 11-12  Morbid Obesity -BMI 39.09 -lifestyle modification      Status is: Inpatient  Appropriate for inpatient due to severity of illness  Dispo: The patient is from: Home              Anticipated d/c is to: Home              Patient currently is not medically stable to d/c.   Difficult to place patient No        Family Communication:  no Family at bedside  Consultants:  GI, general surgery  Code Status:  FULL   DVT Prophylaxis:  SCDs   Procedures: As Listed in Progress Note Above  Antibiotics: None      Subjective: Patient states that abdominal pain is little bit better.  She continues to have back and hip pain which is chronic.  She denies any chest pain, shortness breath, vomiting, diarrhea.  She has some nausea.  There is no fevers or chills.  Objective: Vitals:   03/25/21 0321 03/25/21 0325 03/25/21 0501 03/25/21 0544  BP: (!) 148/74 (!) 148/74  (!) 141/72  Pulse: 79 78  71  Resp: 17 17  18   Temp: (!) 97.5 F (36.4 C) (!) 97.5 F (36.4 C) 98 F (36.7 C) 97.7 F (36.5 C)  TempSrc: Oral Oral Axillary Oral  SpO2: 100% 100%  100%  Weight: 90.8 kg     Height: 5' (1.524 m)       Intake/Output Summary (Last 24 hours) at 03/25/2021 3716 Last data filed at 03/25/2021 0700 Gross per 24 hour  Intake 1100 ml  Output 400 ml  Net 700 ml   Weight change:  Exam:   General:  Pt is alert,  follows commands appropriately, not in acute distress  HEENT: No icterus, No thrush, No neck mass, [place]/AT  Cardiovascular: RRR, S1/S2, no rubs, no gallops  Respiratory: Bibasal crackles.  No wheezing.  Good air movement  Abdomen: Soft/+BS, RUQ, epigastric tender, non distended, no guarding  Extremities: trace LE edema, No lymphangitis, No petechiae, No rashes, no synovitis   Data Reviewed: I have personally reviewed following labs and imaging studies Basic Metabolic Panel: Recent Labs  Lab 03/24/21 2201 03/25/21 0455  NA 139 142  K 3.3* 3.6  CL 99 101  CO2 28 32  GLUCOSE 180* 134*  BUN 24* 22  CREATININE 0.75 0.76  CALCIUM 10.0 9.4  MG  --  1.7  PHOS  --  4.6   Liver Function Tests: Recent Labs  Lab 03/24/21 2201 03/25/21 0455  AST 123* 517*  ALT 52* 259*  ALKPHOS 92 99  BILITOT 2.5* 1.6*  PROT 6.6 5.4*  ALBUMIN 3.6 2.9*   Recent Labs  Lab 03/24/21 2201  LIPASE 20   No results for input(s): AMMONIA in the last 168 hours. Coagulation Profile: Recent Labs  Lab 03/25/21 0455  INR 1.0   CBC: Recent Labs  Lab 03/24/21 2201 03/25/21 0455  WBC 11.3* 4.7  NEUTROABS 8.9*  --   HGB 12.6 10.8*  HCT 37.0 33.2*  MCV 95.9 100.6*  PLT 261 202   Cardiac Enzymes: No results for input(s): CKTOTAL, CKMB, CKMBINDEX, TROPONINI in the last 168 hours. BNP: Invalid input(s): POCBNP CBG: No results for input(s): GLUCAP in the last 168 hours. HbA1C: No results for input(s): HGBA1C in the last 72 hours. Urine analysis:    Component Value Date/Time   COLORURINE AMBER (A) 03/24/2021 2201   APPEARANCEUR CLOUDY (A) 03/24/2021 2201   LABSPEC 1.038 (H) 03/24/2021 2201   PHURINE 5.0 03/24/2021 2201   GLUCOSEU 50 (A) 03/24/2021 2201   HGBUR NEGATIVE 03/24/2021 2201   BILIRUBINUR NEGATIVE 03/24/2021 2201   KETONESUR 5 (A) 03/24/2021 2201   PROTEINUR 100 (A) 03/24/2021 2201   NITRITE NEGATIVE 03/24/2021 2201   LEUKOCYTESUR NEGATIVE 03/24/2021 2201   Sepsis  Labs: @LABRCNTIP (procalcitonin:4,lacticidven:4) ) Recent Results (from the past 240 hour(s))  Resp Panel by RT-PCR (Flu A&B, Covid) Nasopharyngeal Swab     Status: None   Collection Time: 03/25/21  1:48 AM   Specimen: Nasopharyngeal Swab; Nasopharyngeal(NP) swabs in vial transport medium  Result Value Ref Range Status   SARS Coronavirus 2 by RT PCR NEGATIVE NEGATIVE Final    Comment: (NOTE) SARS-CoV-2 target nucleic acids are NOT DETECTED.  The SARS-CoV-2 RNA is generally detectable in upper respiratory specimens during the acute phase of infection. The lowest concentration of SARS-CoV-2 viral copies this assay can detect is 138 copies/mL. A negative result does not preclude SARS-Cov-2 infection and should not be used as the sole basis for treatment or other patient management decisions. A negative result may occur with  improper specimen collection/handling, submission of specimen other than nasopharyngeal swab, presence of viral mutation(s) within the areas targeted by this assay, and inadequate number of viral copies(<138 copies/mL). A negative result must be combined with clinical observations, patient history, and epidemiological  information. The expected result is Negative.  Fact Sheet for Patients:  EntrepreneurPulse.com.au  Fact Sheet for Healthcare Providers:  IncredibleEmployment.be  This test is no t yet approved or cleared by the [place] FDA and  has been authorized for detection and/or diagnosis of SARS-CoV-2 by FDA under an Emergency Use Authorization (EUA). This EUA will remain  in effect (meaning this test can be used) for the duration of the COVID-19 declaration under Section 564(b)(1) of the Act, 21 U.S.C.section 360bbb-3(b)(1), unless the authorization is terminated  or revoked sooner.       Influenza A by PCR NEGATIVE NEGATIVE Final   Influenza B by PCR NEGATIVE NEGATIVE Final    Comment: (NOTE) The Xpert Xpress  SARS-CoV-2/FLU/RSV plus assay is intended as an aid in the diagnosis of influenza from Nasopharyngeal swab specimens and should not be used as a sole basis for treatment. Nasal washings and aspirates are unacceptable for Xpert Xpress SARS-CoV-2/FLU/RSV testing.  Fact Sheet for Patients: EntrepreneurPulse.com.au  Fact Sheet for Healthcare Providers: IncredibleEmployment.be  This test is not yet approved or cleared by the [place] FDA and has been authorized for detection and/or diagnosis of SARS-CoV-2 by FDA under an Emergency Use Authorization (EUA). This EUA will remain in effect (meaning this test can be used) for the duration of the COVID-19 declaration under Section 564(b)(1) of the Act, 21 U.S.C. section 360bbb-3(b)(1), unless the authorization is terminated or revoked.  Performed at [facility], [address], [place], [place] [address]      Scheduled Meds: . aspirin EC  81 mg Oral Daily  . atorvastatin  80 mg Oral Daily  . metoprolol succinate  150 mg Oral Daily   Continuous Infusions: . piperacillin-tazobactam (ZOSYN)  IV 3.375 g (03/25/21 [id number])    Procedures/Studies: CT ABDOMEN PELVIS W CONTRAST  Result Date: 03/25/2021 CLINICAL DATA:  Acute abdominal pain. History of MI, hypertension, congestive heart failure, breast cancer. EXAM: CT ABDOMEN AND PELVIS WITH CONTRAST TECHNIQUE: Multidetector CT imaging of the abdomen and pelvis was performed using the standard protocol following bolus administration of intravenous contrast. CONTRAST:  176mL OMNIPAQUE IOHEXOL 300 MG/ML  SOLN COMPARISON:  02/19/2021 FINDINGS: Lower chest: Lung bases are clear. Hepatobiliary: Gallbladder wall thickening and edema with infiltration into the pericholecystic fat. Small stones layering in the gallbladder. Extrahepatic bile duct dilatation with suggestion of stones in the distal common bile duct. Moderate intrahepatic bile duct dilatation. No focal liver  lesions. Small amount of perihepatic ascites. Pancreas: Unremarkable. No pancreatic ductal dilatation or surrounding inflammatory changes. Spleen: Normal in size without focal abnormality. Adrenals/Urinary Tract: No adrenal gland nodules. Complex hypoenhancing lesion demonstrated in the left kidney measuring 1.9 cm diameter. This may represent a renal cell carcinoma. Similar appearance to previous study. No hydronephrosis or hydroureter. Bladder wall is thickened, probably due to under distention although cystitis could also have this appearance. Stomach/Bowel: Stomach, small bowel, and colon are not abnormally distended. Scattered colonic diverticula without evidence of diverticulitis. Infiltration and wall thickening around the proximal duodenum probably is referred inflammation from the gallbladder. Duodenitis could also be present. Appendix is not identified. Vascular/Lymphatic: Aortic atherosclerosis. No enlarged abdominal or pelvic lymph nodes. Reproductive: Uterus is surgically absent. No abnormal adnexal masses. Other: No free air in the abdomen. Moderate-sized periumbilical hernia containing fat. Musculoskeletal: Degenerative changes in the spine. Lumbar scoliosis convex towards the left. Sclerotic bone lesions consistent with metastatic disease and vertebral compression of L2. IMPRESSION: 1. Cholelithiasis with changes of acute cholecystitis. Bile duct dilatation with probable stones  in the distal common bile duct. 2. Infiltration and wall thickening around the proximal duodenum probably is referred inflammation from the gallbladder. Duodenitis could also have this appearance. 3. Complex hypoenhancing lesion in the left kidney may represent a renal cell carcinoma. No change since prior study. 4. Sclerotic bone lesions consistent with metastatic disease. Compression of L2. 5. Moderate-sized periumbilical hernia containing fat. 6. Aortic atherosclerosis. Aortic Atherosclerosis (ICD10-I70.0). Electronically  Signed   By: Lucienne Capers M.D.   On: 03/25/2021 00:50    Orson Eva, DO  [facility]  If 7PM-7AM, please contact night-coverage [email or web address] Password [email or web address] 03/25/2021, 9:22 AM   LOS: 0 days

## 2021-03-25 NOTE — H&P (View-Only) (Signed)
[facility] Consult  Reason for Consult: Choledocholithiasis, Cholecystitis  Referring Physician: Dr. Carles Collet   Chief Complaint    Abdominal Pain; Back Pain      HPI: Stacey [name] is a 75 y.o. 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 She also had some associated nausea. She came to the hospital and ED workup demonstrated gallbladder thickening and stones and elevated bilirubin.  She uses oxygen as needed and is wheelchair bound. She says her pain is better now.    Past Medical History:  Diagnosis Date  . CAD in native artery    a. DES to ramus 2005 with late stent thrombosis 2006 tx with PTCA. 12/18 PCI/DES x1 to mRCA, EF 50-55%  . CHF (congestive heart failure) ([place])   . Chronic pain   . COPD (chronic obstructive pulmonary disease) ([place])   . Hyperlipidemia   . Hypertension   . Left bundle branch block   . Lymphedema   . Metastatic breast cancer ([place])    a. to bone.  . MI (myocardial infarction) ([place])   . Mild aortic stenosis 10/2017  . Morbid obesity ([place])   . PAF (paroxysmal atrial fibrillation) ([place])   . PSVT (paroxysmal supraventricular tachycardia) ([place])    a. per [facility] notes, seen on event monitor in 2014.  . Pulmonary nodules     Past Surgical History:  Procedure Laterality Date  . CORONARY STENT INTERVENTION N/A 11/26/2017   Procedure: CORONARY STENT INTERVENTION;  Surgeon: Martinique, Peter M, MD;  Location: [place] [facility];  Service: Cardiovascular;  Laterality: N/A;  . CORONARY STENT PLACEMENT    . intrathecal pain pump    . LEFT HEART CATH AND CORONARY ANGIOGRAPHY N/A 11/26/2017   Procedure: LEFT HEART CATH AND CORONARY ANGIOGRAPHY;  Surgeon: Martinique, Peter M, MD;  Location: [place] [facility];  Service: Cardiovascular;  Laterality: N/A;  . VASCULAR SURGERY      Family History   Problem Relation Age of Onset  . CAD Father 29  . Heart attack Father   . COPD Sister   . CAD Paternal Grandmother   . Sudden Cardiac Death Neg Hx   . Colon polyps Neg Hx   . Colon cancer Neg Hx     Social History   Tobacco Use  . Smoking status: Former Smoker    Packs/day: 2.00    Years: 18.00    Pack years: 36.00    Types: Cigarettes    Quit date: 12/01/1980    Years since quitting: 40.3  . Smokeless tobacco: Never Used  Vaping Use  . Vaping Use: Never used  Substance Use Topics  . Alcohol use: No  . Drug use: Yes    Types: Oxycodone, Morphine    Comment: takes methadone    Medications:  I have reviewed the patient's current medications. Prior to Admission:  Medications Prior to Admission  Medication Sig Dispense Refill Last Dose  . acetaminophen (TYLENOL) 650 MG CR tablet Take by mouth.     Marland Kitchen aspirin EC 81 MG tablet Take 81 mg by mouth daily. Swallow whole.   03/24/2021 at Unknown time  . atorvastatin (LIPITOR) 80 MG tablet Take 1 tablet (80 mg total) by mouth daily. 90 tablet 2 03/24/2021 at Unknown time  . b complex vitamins tablet Take 1 tablet by mouth every morning. 30 tablet  0 03/24/2021 at Unknown time  . carboxymethylcellulose (REFRESH PLUS) 0.5 % SOLN Place 1 drop into both eyes in the morning, at noon, in the evening, and at bedtime.     . clopidogrel (PLAVIX) 75 MG tablet Take 75 mg by mouth daily.   03/24/2021 at 0900  . diclofenac Sodium (VOLTAREN) 1 % GEL Apply 2 g topically 4 (four) times daily.     . DULoxetine (CYMBALTA) 30 MG capsule Take 3 capsules (90 mg total) by mouth daily. (Patient taking differently: Take 30 mg by mouth daily.) 30 capsule 0 03/24/2021 at Unknown time  . losartan (COZAAR) 50 MG tablet Take 1 tablet by mouth daily.   03/24/2021 at Unknown time  . metoprolol succinate (TOPROL-XL) 100 MG 24 hr tablet TAKE 1 AND 1/2 TABLETS DAILY (Patient taking differently: Take 150 mg by mouth daily.) 135 tablet 2 03/24/2021 at 0900  . Multiple Vitamin  (MULTIVITAMIN ADULT PO) Take 1 tablet by mouth daily.     . Multiple Vitamins-Minerals (EQ VISION FORMULA 50+ PO) Take 1 tablet by mouth every morning.     . nitroGLYCERIN (NITROSTAT) 0.4 MG SL tablet Place 1 tablet (0.4 mg total) under the tongue every 5 (five) minutes x 3 doses as needed for chest pain (if no relief after 3rd dose, proceed to the ED or call 911). 25 tablet 3   . oxyCODONE (OXY IR/ROXICODONE) 5 MG immediate release tablet Take 5 mg by mouth every 6 (six) hours as needed for moderate pain.   03/24/2021 at Unknown time  . PAIN MANAGEMENT INTRATHECAL, IT, PUMP by Intrathecal route.     . polyethylene glycol powder (GLYCOLAX/MIRALAX) 17 GM/SCOOP powder Take 17 g by mouth daily as needed for moderate constipation.      . pregabalin (LYRICA) 50 MG capsule Take 50 mg by mouth 3 (three) times daily.   03/24/2021 at Unknown time  . senna-docusate (SENOKOT-S) 8.6-50 MG tablet 1 tablet once daily      Scheduled: . aspirin EC  81 mg Oral Daily  . atorvastatin  80 mg Oral Daily  . metoprolol succinate  150 mg Oral Daily  . pregabalin  50 mg Oral TID   Continuous: . piperacillin-tazobactam (ZOSYN)  IV 3.375 g (03/25/21 3220)   URK:YHCWCBJS injection, nitroGLYCERIN, ondansetron (ZOFRAN) IV  Allergies  Allergen Reactions  . Brilinta [Ticagrelor] Diarrhea and Nausea Only    Nausea and severe diarrhea- general weakness. Patient says does not want to take again  . Budesonide Palpitations  . Albuterol Other (See Comments)    Heart racing      ROS:  A comprehensive review of systems was negative except for: Gastrointestinal: positive for abdominal pain, constipation and nausea  Blood pressure (!) 141/72, pulse 71, temperature 97.7 F (36.5 C), temperature source Oral, resp. rate 18, height 5' (1.524 m), weight 90.8 kg, SpO2 100 %. Physical Exam Vitals reviewed.  Constitutional:      Appearance: She is obese.  HENT:     Head: Normocephalic.  Eyes:     Extraocular Movements:  Extraocular movements intact.  Cardiovascular:     Rate and Rhythm: Normal rate.  Pulmonary:     Effort: Pulmonary effort is normal.  Abdominal:     Palpations: Abdomen is soft.     Tenderness: There is abdominal tenderness in the right upper quadrant and epigastric area.  Skin:    General: Skin is warm.  Neurological:     General: No focal deficit present.     Mental Status:  She is alert and oriented to person, place, and time.  Psychiatric:        Mood and Affect: Mood normal.        Behavior: Behavior normal.     Results: Results for orders placed or performed during the hospital encounter of 03/24/21 (from the past 48 hour(s))  CBC with Differential     Status: Abnormal   Collection Time: 03/24/21 10:01 PM  Result Value Ref Range   WBC 11.3 (H) 4.0 - 10.5 K/uL   RBC 3.86 (L) 3.87 - 5.11 MIL/uL   Hemoglobin 12.6 12.0 - 15.0 g/dL   HCT 37.0 36.0 - 46.0 %   MCV 95.9 80.0 - 100.0 fL   MCH 32.6 26.0 - 34.0 pg   MCHC 34.1 30.0 - 36.0 g/dL   RDW 13.9 11.5 - 15.5 %   Platelets 261 150 - 400 K/uL   nRBC 0.0 0.0 - 0.2 %   Neutrophils Relative % 79 %   Neutro Abs 8.9 (H) 1.7 - 7.7 K/uL   Lymphocytes Relative 14 %   Lymphs Abs 1.6 0.7 - 4.0 K/uL   Monocytes Relative 5 %   Monocytes Absolute 0.6 0.1 - 1.0 K/uL   Eosinophils Relative 1 %   Eosinophils Absolute 0.2 0.0 - 0.5 K/uL   Basophils Relative 0 %   Basophils Absolute 0.1 0.0 - 0.1 K/uL   Immature Granulocytes 1 %   Abs Immature Granulocytes 0.06 0.00 - 0.07 K/uL    Comment: Performed at [facility], [address] [place], [place] [address]  Comprehensive metabolic panel     Status: Abnormal   Collection Time: 03/24/21 10:01 PM  Result Value Ref Range   Sodium 139 135 - 145 mmol/L   Potassium 3.3 (L) 3.5 - 5.1 mmol/L   Chloride 99 98 - 111 mmol/L   CO2 28 22 - 32 mmol/L   Glucose, Bld 180 (H) 70 - 99 mg/dL    Comment: Glucose reference range applies only to samples taken after fasting for at least 8 hours.   BUN  24 (H) 8 - 23 mg/dL   Creatinine, Ser 0.75 0.44 - 1.00 mg/dL   Calcium 10.0 8.9 - 10.3 mg/dL   Total Protein 6.6 6.5 - 8.1 g/dL   Albumin 3.6 3.5 - 5.0 g/dL   AST 123 (H) 15 - 41 U/L   ALT 52 (H) 0 - 44 U/L   Alkaline Phosphatase 92 38 - 126 U/L   Total Bilirubin 2.5 (H) 0.3 - 1.2 mg/dL   GFR, Estimated >60 >60 mL/min    Comment: (NOTE) Calculated using the CKD-EPI Creatinine Equation (2021)    Anion gap 12 5 - 15    Comment: Performed at [facility], [address] [place], [place] [address]  Lipase, blood     Status: None   Collection Time: 03/24/21 10:01 PM  Result Value Ref Range   Lipase 20 11 - 51 U/L    Comment: Performed at [facility], [address] [place], [place] [address]  Urinalysis, Routine w reflex microscopic Urine, Clean Catch     Status: Abnormal   Collection Time: 03/24/21 10:01 PM  Result Value Ref Range   Color, Urine AMBER (A) YELLOW    Comment: BIOCHEMICALS MAY BE AFFECTED BY COLOR   APPearance CLOUDY (A) CLEAR   Specific Gravity, Urine 1.038 (H) 1.005 - 1.030   pH 5.0 5.0 - 8.0   Glucose, UA 50 (A) NEGATIVE mg/dL   Hgb urine dipstick  NEGATIVE NEGATIVE   Bilirubin Urine NEGATIVE NEGATIVE   Ketones, ur 5 (A) NEGATIVE mg/dL   Protein, ur 100 (A) NEGATIVE mg/dL   Nitrite NEGATIVE NEGATIVE   Leukocytes,Ua NEGATIVE NEGATIVE   RBC / HPF 11-20 0 - 5 RBC/hpf   WBC, UA 21-50 0 - 5 WBC/hpf   Bacteria, UA NONE SEEN NONE SEEN   Squamous Epithelial / LPF 0-5 0 - 5   Ca Oxalate Crys, UA PRESENT     Comment: Performed at [facility], [address], [place], [place] [address]  Resp Panel by RT-PCR (Flu A&B, Covid) Nasopharyngeal Swab     Status: None   Collection Time: 03/25/21  1:48 AM   Specimen: Nasopharyngeal Swab; Nasopharyngeal(NP) swabs in vial transport medium  Result Value Ref Range   SARS Coronavirus 2 by RT PCR NEGATIVE NEGATIVE    Comment: (NOTE) SARS-CoV-2 target nucleic acids are NOT DETECTED.  The SARS-CoV-2 RNA is generally detectable in  upper respiratory specimens during the acute phase of infection. The lowest concentration of SARS-CoV-2 viral copies this assay can detect is 138 copies/mL. A negative result does not preclude SARS-Cov-2 infection and should not be used as the sole basis for treatment or other patient management decisions. A negative result may occur with  improper specimen collection/handling, submission of specimen other than nasopharyngeal swab, presence of viral mutation(s) within the areas targeted by this assay, and inadequate number of viral copies(<138 copies/mL). A negative result must be combined with clinical observations, patient history, and epidemiological information. The expected result is Negative.  Fact Sheet for Patients:  EntrepreneurPulse.com.au  Fact Sheet for Healthcare Providers:  IncredibleEmployment.be  This test is no t yet approved or cleared by the [place] FDA and  has been authorized for detection and/or diagnosis of SARS-CoV-2 by FDA under an Emergency Use Authorization (EUA). This EUA will remain  in effect (meaning this test can be used) for the duration of the COVID-19 declaration under Section 564(b)(1) of the Act, 21 U.S.C.section 360bbb-3(b)(1), unless the authorization is terminated  or revoked sooner.       Influenza A by PCR NEGATIVE NEGATIVE   Influenza B by PCR NEGATIVE NEGATIVE    Comment: (NOTE) The Xpert Xpress SARS-CoV-2/FLU/RSV plus assay is intended as an aid in the diagnosis of influenza from Nasopharyngeal swab specimens and should not be used as a sole basis for treatment. Nasal washings and aspirates are unacceptable for Xpert Xpress SARS-CoV-2/FLU/RSV testing.  Fact Sheet for Patients: EntrepreneurPulse.com.au  Fact Sheet for Healthcare Providers: IncredibleEmployment.be  This test is not yet approved or cleared by the [place] FDA and has been authorized  for detection and/or diagnosis of SARS-CoV-2 by FDA under an Emergency Use Authorization (EUA). This EUA will remain in effect (meaning this test can be used) for the duration of the COVID-19 declaration under Section 564(b)(1) of the Act, 21 U.S.C. section 360bbb-3(b)(1), unless the authorization is terminated or revoked.  Performed at [facility], [address] [place], [place] [address]   Comprehensive metabolic panel     Status: Abnormal   Collection Time: 03/25/21  4:55 AM  Result Value Ref Range   Sodium 142 135 - 145 mmol/L   Potassium 3.6 3.5 - 5.1 mmol/L   Chloride 101 98 - 111 mmol/L   CO2 32 22 - 32 mmol/L   Glucose, Bld 134 (H) 70 - 99 mg/dL    Comment: Glucose reference range applies only to samples taken after fasting for at least 8 hours.  BUN 22 8 - 23 mg/dL   Creatinine, Ser 0.76 0.44 - 1.00 mg/dL   Calcium 9.4 8.9 - 10.3 mg/dL   Total Protein 5.4 (L) 6.5 - 8.1 g/dL   Albumin 2.9 (L) 3.5 - 5.0 g/dL   AST 517 (H) 15 - 41 U/L   ALT 259 (H) 0 - 44 U/L   Alkaline Phosphatase 99 38 - 126 U/L   Total Bilirubin 1.6 (H) 0.3 - 1.2 mg/dL   GFR, Estimated >60 >60 mL/min    Comment: (NOTE) Calculated using the CKD-EPI Creatinine Equation (2021)    Anion gap 9 5 - 15    Comment: Performed at [facility], [address] [place], [place] [address]  CBC     Status: Abnormal   Collection Time: 03/25/21  4:55 AM  Result Value Ref Range   WBC 4.7 4.0 - 10.5 K/uL   RBC 3.30 (L) 3.87 - 5.11 MIL/uL   Hemoglobin 10.8 (L) 12.0 - 15.0 g/dL   HCT 33.2 (L) 36.0 - 46.0 %   MCV 100.6 (H) 80.0 - 100.0 fL   MCH 32.7 26.0 - 34.0 pg   MCHC 32.5 30.0 - 36.0 g/dL   RDW 14.1 11.5 - 15.5 %   Platelets 202 150 - 400 K/uL   nRBC 0.0 0.0 - 0.2 %    Comment: Performed at [facility], [address] [place], [place] [address]  Protime-INR     Status: None   Collection Time: 03/25/21  4:55 AM  Result Value Ref Range   Prothrombin Time 13.4 11.4 - 15.2 seconds   INR 1.0 0.8 - 1.2     Comment: (NOTE) INR goal varies based on device and disease states. Performed at [facility], [address] [place], [place] [address]   APTT     Status: None   Collection Time: 03/25/21  4:55 AM  Result Value Ref Range   aPTT 26 24 - 36 seconds    Comment: Performed at [facility], [address], [place], [place] [address]  Magnesium     Status: None   Collection Time: 03/25/21  4:55 AM  Result Value Ref Range   Magnesium 1.7 1.7 - 2.4 mg/dL    Comment: Performed at [facility], [address] [place], [place] [address]  Phosphorus     Status: None   Collection Time: 03/25/21  4:55 AM  Result Value Ref Range   Phosphorus 4.6 2.5 - 4.6 mg/dL    Comment: Performed at [facility], [address], [place], [place] [address]  Hemoglobin A1c     Status: None   Collection Time: 03/25/21  4:55 AM  Result Value Ref Range   Hgb A1c MFr Bld 5.5 4.8 - 5.6 %    Comment: (NOTE) Pre diabetes:          5.7%-6.4%  Diabetes:              >6.4%  Glycemic control for   <7.0% adults with diabetes    Mean Plasma Glucose 111.15 mg/dL    Comment: Performed at [facility] [address], [place],  [address]    CT ABDOMEN PELVIS W CONTRAST  Result Date: 03/25/2021 CLINICAL DATA:  Acute abdominal pain. History of MI, hypertension, congestive heart failure, breast cancer. EXAM: CT ABDOMEN AND PELVIS WITH CONTRAST TECHNIQUE: Multidetector CT imaging of the abdomen and pelvis was performed using the standard protocol following bolus administration of intravenous contrast. CONTRAST:  165mL OMNIPAQUE IOHEXOL 300  MG/ML  SOLN COMPARISON:  02/19/2021 FINDINGS: Lower chest: Lung bases are clear. Hepatobiliary: Gallbladder wall thickening and edema with infiltration into the pericholecystic fat. Small stones layering in the gallbladder. Extrahepatic bile duct dilatation with suggestion of stones in the distal common bile duct. Moderate intrahepatic bile duct dilatation. No focal liver lesions.  Small amount of perihepatic ascites. Pancreas: Unremarkable. No pancreatic ductal dilatation or surrounding inflammatory changes. Spleen: Normal in size without focal abnormality. Adrenals/Urinary Tract: No adrenal gland nodules. Complex hypoenhancing lesion demonstrated in the left kidney measuring 1.9 cm diameter. This may represent a renal cell carcinoma. Similar appearance to previous study. No hydronephrosis or hydroureter. Bladder wall is thickened, probably due to under distention although cystitis could also have this appearance. Stomach/Bowel: Stomach, small bowel, and colon are not abnormally distended. Scattered colonic diverticula without evidence of diverticulitis. Infiltration and wall thickening around the proximal duodenum probably is referred inflammation from the gallbladder. Duodenitis could also be present. Appendix is not identified. Vascular/Lymphatic: Aortic atherosclerosis. No enlarged abdominal or pelvic lymph nodes. Reproductive: Uterus is surgically absent. No abnormal adnexal masses. Other: No free air in the abdomen. Moderate-sized periumbilical hernia containing fat. Musculoskeletal: Degenerative changes in the spine. Lumbar scoliosis convex towards the left. Sclerotic bone lesions consistent with metastatic disease and vertebral compression of L2. IMPRESSION: 1. Cholelithiasis with changes of acute cholecystitis. Bile duct dilatation with probable stones in the distal common bile duct. 2. Infiltration and wall thickening around the proximal duodenum probably is referred inflammation from the gallbladder. Duodenitis could also have this appearance. 3. Complex hypoenhancing lesion in the left kidney may represent a renal cell carcinoma. No change since prior study. 4. Sclerotic bone lesions consistent with metastatic disease. Compression of L2. 5. Moderate-sized periumbilical hernia containing fat. 6. Aortic atherosclerosis. Aortic Atherosclerosis (ICD10-I70.0). Electronically Signed    By: Lucienne Capers M.D.   On: 03/25/2021 00:50     Assessment & Plan:  CHARELL FAULK is a 74 y.o. female with concern for possible choledocholithiasis and cholecystitis on CT scan. Liver enzymes improving this am. She has a significant cardiac history with EF 35%.  She is currently feeling a lot better. Discussed potential need for ERCP. Discussed her pain pump which she says was placed recently and after her previous MRCPs. Discussed that cardiology was going to see her and that GI was seeing her to regarding the possibility of the choledocholithiasis.    Agree with antibiotics Cardiology consult GI consulted for possible ERCP/ may have to do MRCP if patient pump is compatible   All questions were answered to the satisfaction of the patient.    [name] 03/25/2021, 1:54 PM

## 2021-03-25 NOTE — Plan of Care (Signed)

## 2021-03-25 NOTE — TOC Initial Note (Signed)
Transition of Care [facility]) - Initial/Assessment Note    Patient Details  Name: Stacey [name] MRN: [id number] Date of Birth: 10/09/1946  Transition of Care [facility]) CM/SW Contact:    Salome Arnt, [place] Phone Number: 03/25/2021, 1:52 PM  Clinical Narrative:  Pt admitted due to cholecystitis. TOC received consult for home health, DME, and medication assistance. Pt reports she had home health with [facility] and was told she needed to wait 30 days before using benefit again. Pt would like home health services with [facility] again if possible. LCSW contacted Tommi Rumps with [facility] who can accept for PT and RN. Pt updated. Pt also requests rolling walker and agreeable for [employer] to drop ship to home. Referral made to [facility] with [facility] for rolling walker. Pt indicates she is able to afford medications. MD has entered orders for walker and home health. TOC will continue to follow.                   Expected Discharge Plan: [place] Barriers to Discharge: Continued Medical Work up   Patient Goals and CMS Choice Patient states their goals for this hospitalization and ongoing recovery are:: return home   Choice offered to / list presented to : Patient  Expected Discharge Plan and Services Expected Discharge Plan: [place] In-house Referral: Clinical Social Work   Post Acute Care Choice: [place] arrangements for the past 2 months: [place]                 DME Arranged: Walker rolling DME Agency: [employer] Date DME Agency Contacted: 03/25/21 Time DME Agency Contacted: [id number] Representative spoke with at DME Agency: Freda Munro HH Arranged: RN,PT [place] Agency: [place] Date [place]: 03/25/21 Time [place]: [place] Representative spoke with at [place]  Prior Living Arrangements/Services Living arrangements for the past 2 months: [place] with:: Adult Children Patient language and need  for interpreter reviewed:: Yes Do you feel safe going back to the place where you live?: Yes          Current home services: DME Criminal Activity/Legal Involvement Pertinent to Current Situation/Hospitalization: No - Comment as needed  Activities of Daily Living Home Assistive Devices/Equipment: Oxygen,Transfer belt ADL Screening (condition at time of admission) Patient's cognitive ability adequate to safely complete daily activities?: Yes Is the patient deaf or have difficulty hearing?: No Does the patient have difficulty seeing, even when wearing glasses/contacts?: Yes (needs to see eye doc for new rx.. not wearing now) Does the patient have difficulty concentrating, remembering, or making decisions?: No Patient able to express need for assistance with ADLs?: Yes Does the patient have difficulty dressing or bathing?: Yes Independently performs ADLs?: No Communication: Independent Dressing (OT): Needs assistance Is this a change from baseline?: Pre-admission baseline Grooming: Needs assistance Is this a change from baseline?: Pre-admission baseline Feeding: Needs assistance,Independent Is this a change from baseline?: Pre-admission baseline Bathing: Needs assistance Is this a change from baseline?: Pre-admission baseline Toileting: Needs assistance Is this a change from baseline?: Pre-admission baseline In/Out Bed: Needs assistance Is this a change from baseline?: Pre-admission baseline Walks in Home: Needs assistance Is this a change from baseline?: Pre-admission baseline Does the patient have difficulty walking or climbing stairs?: Yes Weakness of Legs: Both Weakness of Arms/Hands: Both  Permission Sought/Granted                  Emotional Assessment  Attitude/Demeanor/Rapport: Engaged Affect (typically observed): Accepting Orientation: : Oriented to Self,Oriented to Place,Oriented to  Time,Oriented to Situation Alcohol / Substance Use: Not Applicable Psych  Involvement: No (comment)  Admission diagnosis:  Cholecystitis [K81.9] Generalized abdominal pain [R10.84] Cholecystitis, acute with cholelithiasis [K80.00] Patient Active Problem List   Diagnosis Date Noted  . Cholecystitis, acute with cholelithiasis 03/25/2021  . Abdominal pain 03/25/2021  . Leukocytosis 03/25/2021  . Hypokalemia 03/25/2021  . Hyperglycemia 03/25/2021  . Chronic combined systolic and diastolic CHF (congestive heart failure) ([place]) 03/25/2021  . Obesity, Class II, BMI 35-39.9 03/25/2021  . Rectal bleeding 08/23/2020  . Supratherapeutic INR 08/23/2020  . Left hip pain   . Pain of metastatic malignancy 06/27/2020  . Malignant neoplasm of right breast in female, estrogen receptor positive ([place])   . Goals of care, counseling/discussion   . Advanced care planning/counseling discussion   . Spinal stenosis of lumbar region   . Bone metastases ([place])   . Palliative care by specialist   . Lumbar pain 04/24/2020  . Lumbar radiculopathy 02/16/2020  . Palpitations 11/16/2019  . Dysuria   . Acute bilateral low back pain 10/02/2019  . Bipolar 2 disorder, major depressive episode ([place]) 09/01/2019  . Adjustment disorder with depressed mood 06/30/2019  . Atrial fibrillation with rapid ventricular response ([place]) 11/14/2018  . Fall   . Obesity, Class III, BMI 40-49.9 (morbid obesity) ([place])   . Chronic respiratory failure with hypoxia ([place])   . Abnormal transaminases 10/07/2018  . PSVT (paroxysmal supraventricular tachycardia) ([place]) 10/07/2018  . Hyperlipidemia 10/07/2018  . COPD (chronic obstructive pulmonary disease) ([place]) 10/07/2018  . CAD in native artery 10/07/2018  . Chronic pain 10/07/2018  . Acute back pain 10/07/2018  . Acute hypoxemic respiratory failure ([place]) 09/19/2018  . Acute on chronic combined systolic and diastolic CHF (congestive heart failure) ([place]) 09/19/2018  . COPD with acute exacerbation ([place]) 09/19/2018  . Dyspnea 11/30/2017  . Non-ST elevation  (NSTEMI) myocardial infarction ([place])   . Hypertension 11/23/2017  . Left bundle branch block 11/23/2017  . Acute on chronic diastolic CHF (congestive heart failure) ([place]) 11/23/2017  . CAD (coronary artery disease) 11/23/2017  . Metastatic breast cancer ([place]) 11/23/2017  . Acute respiratory failure with hypoxia ([place]) 11/23/2017  . Atypical nevus 07/29/2016  . Age-related nuclear cataract, bilateral 02/12/2016  . Anxiety and depression 01/24/2016  . Arthropathy, lower leg 09/14/2013  . Tibialis tendinitis 02/22/2013  . Plantar fasciitis 01/17/2013   PCP:  Sela Hilding Pharmacy:   [place], [place] - [address] [place] [address] Phone: [phone] Fax: [phone]  [facility] - [place] [place] [address] Phone: [phone] Fax: [phone]     Social Determinants of Health (SDOH) Interventions    Readmission Risk Interventions Readmission Risk Prevention Plan 07/03/2020 04/26/2020  Transportation Screening Complete Complete  HRI or [place] - Complete  Social Work Consult for [place] Planning/Counseling - Complete  Palliative Care Screening - Complete  Medication Review Press photographer) Complete Complete  PCP or Specialist appointment within 3-5 days of discharge Complete -  [place] or Home Care Consult Complete -  SW Recovery Care/Counseling Consult Complete -  Palliative Care Screening Complete -  [place] Not Applicable -  Some recent data might be hidden

## 2021-03-25 NOTE — Consult Note (Addendum)
Referring Provider: Dr. Carles Collet  Primary Care Physician:  Sela Hilding Primary Gastroenterologist:  Dr. Jenetta Downer  Date of Admission: 03/24/21 Date of Consultation: 03/25/21  Reason for Consultation:  Possible choledocholithiasis   HPI:  Stacey [name] is a 75 y.o. year old female with history of metastatic right breast cancer to bones and pain pump in place, chronic systolic heart failure (ECHO with EF 35-40% May 2021), CAD with stent in 2018 on Plavix, presenting with acute onset of back/epigastric pain yesterday, associated nausea, acutely elevated transaminases and Tbili 2.5, with CT showing cholelithiasis with changes of acute cholecystitis, bile duct dilation with probable stones in distal CBD, infiltration and wall thickening around proximal duodenum query related to referred inflammation from gallbladder, complex hypoenhancing lesion in left kidney, sclerotic bone lesions. GI consulted for ERCP.  Patient notes acute onset of epigastric and mid back pain yesterday evening, associated nausea but no vomiting. She notes she had pain throughout the night but no pain currently at time of consultation. Believes she had a fever prior to admission but no thermometer. Afebrile on admission. Denies any prior episodes. Last dose of Plavix yesterday.    Sometimes hard to initiate swallowing as throat will be dry, then drinks water to help. No true esophageal dysphagia.   HIstory of chronic constipation. Raisins work best for her. Takes 2 handfuls and munches on it through the day. No prior colonoscopy or endoscopy. Will use oxygen prn but not continuously. Wheelchair/bed bound.   Past Medical History:  Diagnosis Date  . CAD in native artery    a. DES to ramus 2005 with late stent thrombosis 2006 tx with PTCA. 12/18 PCI/DES x1 to mRCA, EF 50-55%  . CHF (congestive heart failure) ([place])   . Chronic pain   . COPD (chronic obstructive pulmonary disease) ([place])   . Hyperlipidemia   . Hypertension    . Left bundle branch block   . Lymphedema   . Metastatic breast cancer ([place])    a. to bone.  . MI (myocardial infarction) ([place])   . Mild aortic stenosis 10/2017  . Morbid obesity ([place])   . PAF (paroxysmal atrial fibrillation) ([place])   . PSVT (paroxysmal supraventricular tachycardia) ([place])    a. per [facility] notes, seen on event monitor in 2014.  . Pulmonary nodules     Past Surgical History:  Procedure Laterality Date  . CORONARY STENT INTERVENTION N/A 11/26/2017   Procedure: CORONARY STENT INTERVENTION;  Surgeon: Martinique, Peter M, MD;  Location: [place] [facility];  Service: Cardiovascular;  Laterality: N/A;  . CORONARY STENT PLACEMENT    . LEFT HEART CATH AND CORONARY ANGIOGRAPHY N/A 11/26/2017   Procedure: LEFT HEART CATH AND CORONARY ANGIOGRAPHY;  Surgeon: Martinique, Peter M, MD;  Location: [place] [facility];  Service: Cardiovascular;  Laterality: N/A;  . VASCULAR SURGERY      Prior to Admission medications   Medication Sig Start Date End Date Taking? Authorizing Provider  acetaminophen (TYLENOL) 650 MG CR tablet Take by mouth.    [provider]  aspirin EC 81 MG tablet Take 81 mg by mouth daily. Swallow whole.    [provider]  atorvastatin (LIPITOR) 80 MG tablet Take 1 tablet (80 mg total) by mouth daily. 05/28/20   Arnoldo Lenis, MD  b complex vitamins tablet Take 1 tablet by mouth every morning. 07/05/20   Florencia Reasons, MD  carboxymethylcellulose (REFRESH PLUS) 0.5 % SOLN Place 1 drop into both eyes in the morning, at noon, in the  evening, and at bedtime.    [provider]  clopidogrel (PLAVIX) 75 MG tablet Take by mouth.    [provider]  diclofenac Sodium (VOLTAREN) 1 % GEL Apply 2 g topically 4 (four) times daily. 10/06/20 10/06/21  [provider]  DULoxetine (CYMBALTA) 30 MG capsule Take 3 capsules (90 mg total) by mouth daily. Patient taking differently: Take 30 mg by mouth daily. 07/06/20   Florencia Reasons, MD  losartan (COZAAR) 50 MG  tablet Take 1 tablet by mouth daily. 03/22/21   [provider]  metoprolol succinate (TOPROL-XL) 100 MG 24 hr tablet TAKE 1 AND 1/2 TABLETS DAILY Patient taking differently: Take 150 mg by mouth daily. 03/28/20   Arnoldo Lenis, MD  Multiple Vitamin (MULTIVITAMIN ADULT PO) Take by mouth.    [provider]  Multiple Vitamins-Minerals (EQ VISION FORMULA 50+ PO) Take 1 tablet by mouth every morning.    [provider]  nitroGLYCERIN (NITROSTAT) 0.4 MG SL tablet Place 1 tablet (0.4 mg total) under the tongue every 5 (five) minutes x 3 doses as needed for chest pain (if no relief after 3rd dose, proceed to the ED or call 911). 03/22/20   Arnoldo Lenis, MD  oxyCODONE (OXY IR/ROXICODONE) 5 MG immediate release tablet Take by mouth. 12/27/20   [provider]  PAIN MANAGEMENT INTRATHECAL, IT, PUMP by Intrathecal route. 02/21/21   [provider]  polyethylene glycol powder (GLYCOLAX/MIRALAX) 17 GM/SCOOP powder Take 17 g by mouth daily as needed for moderate constipation.     [provider]  pregabalin (LYRICA) 50 MG capsule Take 50 mg by mouth 3 (three) times daily. 01/26/21   [provider]  senna-docusate (SENOKOT-S) 8.6-50 MG tablet 1 tablet once daily    [provider]    Current Facility-Administered Medications  Medication Dose Route Frequency Provider Last Rate Last Admin  . aspirin EC tablet 81 mg  81 mg Oral Daily Adefeso, Oladapo, DO      . atorvastatin (LIPITOR) tablet 80 mg  80 mg Oral Daily Adefeso, Oladapo, DO      . metoprolol succinate (TOPROL-XL) 24 hr tablet 150 mg  150 mg Oral Daily Adefeso, Oladapo, DO      . morphine 2 MG/ML injection 2 mg  2 mg Intravenous Q4H PRN Adefeso, Oladapo, DO      . nitroGLYCERIN (NITROSTAT) SL tablet 0.4 mg  0.4 mg Sublingual Q5 Min x 3 PRN Adefeso, Oladapo, DO      . ondansetron (ZOFRAN) injection 4 mg  4 mg Intravenous Q6H PRN Adefeso, Oladapo, DO      .  piperacillin-tazobactam (ZOSYN) IVPB 3.375 g  3.375 g Intravenous Q8H Adefeso, Oladapo, DO 12.5 mL/hr at 03/25/21 0613 3.375 g at 03/25/21 [id number]    Allergies as of 03/24/2021 - Review Complete 03/24/2021  Allergen Reaction Noted  . Brilinta [ticagrelor] Diarrhea and Nausea Only 04/19/2018  . Budesonide Palpitations 09/14/2020  . Albuterol Other (See Comments) 11/30/2018    Family History  Problem Relation Age of Onset  . CAD Father 63  . Heart attack Father   . COPD Sister   . CAD Paternal Grandmother   . Sudden Cardiac Death Neg Hx     Social History   Socioeconomic History  . Marital status: Single    Spouse name: Not on file  . Number of children: Not on file  . Years of education: Not on file  . Highest education level: Not on file  Occupational History  .  Occupation: Retired  Tobacco Use  . Smoking status: Former Smoker    Packs/day: 2.00    Years: 18.00    Pack years: 36.00    Types: Cigarettes    Quit date: 12/01/1980    Years since quitting: 40.3  . Smokeless tobacco: Never Used  Vaping Use  . Vaping Use: Never used  Substance and Sexual Activity  . Alcohol use: No  . Drug use: Yes    Types: Oxycodone, Morphine    Comment: takes methadone  . Sexual activity: Not Currently  Other Topics Concern  . Not on file  Social History Narrative  . Not on file   Social Determinants of Health   Financial Resource Strain: Not on file  Food Insecurity: Not on file  Transportation Needs: No Transportation Needs  . Lack of Transportation (Medical): No  . Lack of Transportation (Non-Medical): No  Physical Activity: Inactive  . Days of Exercise per Week: 0 days  . Minutes of Exercise per Session: 0 min  Stress: Not on file  Social Connections: Not on file  Intimate Partner Violence: Not At Risk  . Fear of Current or Ex-Partner: No  . Emotionally Abused: No  . Physically Abused: No  . Sexually Abused: No    Review of Systems: Gen: Denies fever, chills, loss of  appetite, change in weight or weight loss CV: +heart palpitations, intermittent chest pain and takes nitro prn  Resp: Denies shortness of breath with rest, cough, wheezing GI: see HPI GU : Denies urinary burning, urinary frequency, urinary incontinence.  MS: uses wheelchair briefly, mainly bed bound Derm: Denies rash, itching, dry skin Psych: Denies depression, anxiety,confusion, or memory loss Heme: Denies bruising, bleeding, and enlarged lymph nodes.  Physical Exam: Vital signs in last 24 hours: Temp:  [97.5 F (36.4 C)-98 F (36.7 C)] 97.7 F (36.5 C) (04/25 0544) Pulse Rate:  [71-83] 71 (04/25 0544) Resp:  [16-20] 18 (04/25 0544) BP: (132-181)/(54-112) 141/72 (04/25 0544) SpO2:  [93 %-100 %] 100 % (04/25 0544) Weight:  [88.5 kg-90.8 kg] 90.8 kg (04/25 0321)   General:   Alert, pale-appearing, chronically ill-appearing, no distress Head:  Normocephalic and atraumatic. Eyes:  Sclera clear, no icterus.   Ears:  Normal auditory acuity. Lungs:  Clear throughout to auscultation.   Heart:  S1 S2 present with systolic murmur Abdomen:  Soft, obese, TTP RUQ and epigastric, nondistended. No masses, hepatosplenomegaly or hernias noted. Normal bowel sounds, without guarding, and without rebound.   Rectal:  Deferred  Msk:  Symmetrical without gross deformities. Normal posture. Extremities:  Without edema. Neurologic:  Alert and  oriented x4 Psych:  Alert and cooperative. Normal mood and affect.  Intake/Output from previous day: 04/24 0701 - 04/25 0700 In: 1100 [IV Piggyback:1100] Out: 400 [Urine:400] Intake/Output this shift: No intake/output data recorded.  Lab Results: Recent Labs    03/24/21 2201 03/25/21 0455  WBC 11.3* 4.7  HGB 12.6 10.8*  HCT 37.0 33.2*  PLT 261 202   BMET Recent Labs    03/24/21 2201 03/25/21 0455  NA 139 142  K 3.3* 3.6  CL 99 101  CO2 28 32  GLUCOSE 180* 134*  BUN 24* 22  CREATININE 0.75 0.76  CALCIUM 10.0 9.4   LFT Recent Labs     03/24/21 2201 03/25/21 0455  PROT 6.6 5.4*  ALBUMIN 3.6 2.9*  AST 123* 517*  ALT 52* 259*  ALKPHOS 92 99  BILITOT 2.5* 1.6*   PT/INR Recent Labs    03/25/21 0455  LABPROT 13.4  INR 1.0    Studies/Results: CT ABDOMEN PELVIS W CONTRAST  Result Date: 03/25/2021 CLINICAL DATA:  Acute abdominal pain. History of MI, hypertension, congestive heart failure, breast cancer. EXAM: CT ABDOMEN AND PELVIS WITH CONTRAST TECHNIQUE: Multidetector CT imaging of the abdomen and pelvis was performed using the standard protocol following bolus administration of intravenous contrast. CONTRAST:  118mL OMNIPAQUE IOHEXOL 300 MG/ML  SOLN COMPARISON:  02/19/2021 FINDINGS: Lower chest: Lung bases are clear. Hepatobiliary: Gallbladder wall thickening and edema with infiltration into the pericholecystic fat. Small stones layering in the gallbladder. Extrahepatic bile duct dilatation with suggestion of stones in the distal common bile duct. Moderate intrahepatic bile duct dilatation. No focal liver lesions. Small amount of perihepatic ascites. Pancreas: Unremarkable. No pancreatic ductal dilatation or surrounding inflammatory changes. Spleen: Normal in size without focal abnormality. Adrenals/Urinary Tract: No adrenal gland nodules. Complex hypoenhancing lesion demonstrated in the left kidney measuring 1.9 cm diameter. This may represent a renal cell carcinoma. Similar appearance to previous study. No hydronephrosis or hydroureter. Bladder wall is thickened, probably due to under distention although cystitis could also have this appearance. Stomach/Bowel: Stomach, small bowel, and colon are not abnormally distended. Scattered colonic diverticula without evidence of diverticulitis. Infiltration and wall thickening around the proximal duodenum probably is referred inflammation from the gallbladder. Duodenitis could also be present. Appendix is not identified. Vascular/Lymphatic: Aortic atherosclerosis. No enlarged abdominal or  pelvic lymph nodes. Reproductive: Uterus is surgically absent. No abnormal adnexal masses. Other: No free air in the abdomen. Moderate-sized periumbilical hernia containing fat. Musculoskeletal: Degenerative changes in the spine. Lumbar scoliosis convex towards the left. Sclerotic bone lesions consistent with metastatic disease and vertebral compression of L2. IMPRESSION: 1. Cholelithiasis with changes of acute cholecystitis. Bile duct dilatation with probable stones in the distal common bile duct. 2. Infiltration and wall thickening around the proximal duodenum probably is referred inflammation from the gallbladder. Duodenitis could also have this appearance. 3. Complex hypoenhancing lesion in the left kidney may represent a renal cell carcinoma. No change since prior study. 4. Sclerotic bone lesions consistent with metastatic disease. Compression of L2. 5. Moderate-sized periumbilical hernia containing fat. 6. Aortic atherosclerosis. Aortic Atherosclerosis (ICD10-I70.0). Electronically Signed   By: Lucienne Capers M.D.   On: 03/25/2021 00:50    Impression:  75 y.o. year old female with history of metastatic right breast cancer to bones and pain pump in place, chronic systolic heart failure (ECHO with EF 35-40% May 2021), CAD with stent in 2018 on Plavix, presenting with acute onset of abdominal pain, N/V and admitted with acute cholecystitis and concern for choledocholithiasis with probable stones in distal CBD. Transaminases worsened today (AST 517, ALT 259, previously 123 and 52 respectively) and Tbili improved from 2.5 on admission to 1.6. She noted pain throughout the night but at time of consultation denies any pain. Remaining afebrile.   Chronically on Plavix with history of stent in 2018; she also is followed by Cardiology for afib, CAD, chronic systolic heart failure and last ECHO in May 2021 with EF 35-40%. Updated ECHO today ordered by hospitalist. Cardiology has been consulted as may not be a  candidate for anesthesia here due to comorbidities.   Last dose of Plavix yesterday evening. Continue to follow HFP. Continue to follow clinically. In setting of Plavix, she would need to be off for 5 days prior to ERCP.    Plan: Continue to hold Plavix Continue antibiotics Serial HFP Cardiology consultation Discussing with Dr. Laural Golden, who would perform ERCP here  if patient is an appropriate anesthesia candidate at [facility] If MRCP felt to be needed, would need to ensure intrathecal pump is MRI compatible.    Annitta Needs, PhD, ANP-BC [facility]     LOS: 0 days    03/25/2021, 8:49 AM

## 2021-03-25 NOTE — Consult Note (Signed)
[facility] Consult  Reason for Consult: Choledocholithiasis, Cholecystitis  Referring Physician: Dr. Tat   Chief Complaint    Abdominal Pain; Back Pain      HPI: Stacey [name] is a 75 y.o. 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 She also had some associated nausea. She came to the hospital and ED workup demonstrated gallbladder thickening and stones and elevated bilirubin.  She uses oxygen as needed and is wheelchair bound. She says her pain is better now.    Past Medical History:  Diagnosis Date  . CAD in native artery    a. DES to ramus 2005 with late stent thrombosis 2006 tx with PTCA. 12/18 PCI/DES x1 to mRCA, EF 50-55%  . CHF (congestive heart failure) (HCC)   . Chronic pain   . COPD (chronic obstructive pulmonary disease) (HCC)   . Hyperlipidemia   . Hypertension   . Left bundle branch block   . Lymphedema   . Metastatic breast cancer (HCC)    a. to bone.  . MI (myocardial infarction) (HCC)   . Mild aortic stenosis 10/2017  . Morbid obesity (HCC)   . PAF (paroxysmal atrial fibrillation) (HCC)   . PSVT (paroxysmal supraventricular tachycardia) (HCC)    a. per [facility] notes, seen on event monitor in 2014.  . Pulmonary nodules     Past Surgical History:  Procedure Laterality Date  . CORONARY STENT INTERVENTION N/A 11/26/2017   Procedure: CORONARY STENT INTERVENTION;  Surgeon: Jordan, Peter M, MD;  Location: [facility];  Service: Cardiovascular;  Laterality: N/A;  . CORONARY STENT PLACEMENT    . intrathecal pain pump    . LEFT HEART CATH AND CORONARY ANGIOGRAPHY N/A 11/26/2017   Procedure: LEFT HEART CATH AND CORONARY ANGIOGRAPHY;  Surgeon: Jordan, Peter M, MD;  Location: [facility];  Service: Cardiovascular;  Laterality: N/A;  . VASCULAR SURGERY      Family History   Problem Relation Age of Onset  . CAD Father 62  . Heart attack Father   . COPD Sister   . CAD Paternal Grandmother   . Sudden Cardiac Death Neg Hx   . Colon polyps Neg Hx   . Colon cancer Neg Hx     Social History   Tobacco Use  . Smoking status: Former Smoker    Packs/day: 2.00    Years: 18.00    Pack years: 36.00    Types: Cigarettes    Quit date: 12/01/1980    Years since quitting: 40.3  . Smokeless tobacco: Never Used  Vaping Use  . Vaping Use: Never used  Substance Use Topics  . Alcohol use: No  . Drug use: Yes    Types: Oxycodone, Morphine    Comment: takes methadone    Medications:  I have reviewed the patient's current medications. Prior to Admission:  Medications Prior to Admission  Medication Sig Dispense Refill Last Dose  . acetaminophen (TYLENOL) 650 MG CR tablet Take by mouth.     . aspirin EC 81 MG tablet Take 81 mg by mouth daily. Swallow whole.   03/24/2021 at Unknown time  . atorvastatin (LIPITOR) 80 MG tablet Take 1 tablet (80 mg total) by mouth daily. 90 tablet 2 03/24/2021 at Unknown time  . b complex vitamins tablet Take 1 tablet by mouth every morning. 30 tablet   0 03/24/2021 at Unknown time  . carboxymethylcellulose (REFRESH PLUS) 0.5 % SOLN Place 1 drop into both eyes in the morning, at noon, in the evening, and at bedtime.     . clopidogrel (PLAVIX) 75 MG tablet Take 75 mg by mouth daily.   03/24/2021 at 0900  . diclofenac Sodium (VOLTAREN) 1 % GEL Apply 2 g topically 4 (four) times daily.     . DULoxetine (CYMBALTA) 30 MG capsule Take 3 capsules (90 mg total) by mouth daily. (Patient taking differently: Take 30 mg by mouth daily.) 30 capsule 0 03/24/2021 at Unknown time  . losartan (COZAAR) 50 MG tablet Take 1 tablet by mouth daily.   03/24/2021 at Unknown time  . metoprolol succinate (TOPROL-XL) 100 MG 24 hr tablet TAKE 1 AND 1/2 TABLETS DAILY (Patient taking differently: Take 150 mg by mouth daily.) 135 tablet 2 03/24/2021 at 0900  . Multiple Vitamin  (MULTIVITAMIN ADULT PO) Take 1 tablet by mouth daily.     . Multiple Vitamins-Minerals (EQ VISION FORMULA 50+ PO) Take 1 tablet by mouth every morning.     . nitroGLYCERIN (NITROSTAT) 0.4 MG SL tablet Place 1 tablet (0.4 mg total) under the tongue every 5 (five) minutes x 3 doses as needed for chest pain (if no relief after 3rd dose, proceed to the ED or call 911). 25 tablet 3   . oxyCODONE (OXY IR/ROXICODONE) 5 MG immediate release tablet Take 5 mg by mouth every 6 (six) hours as needed for moderate pain.   03/24/2021 at Unknown time  . PAIN MANAGEMENT INTRATHECAL, IT, PUMP by Intrathecal route.     . polyethylene glycol powder (GLYCOLAX/MIRALAX) 17 GM/SCOOP powder Take 17 g by mouth daily as needed for moderate constipation.      . pregabalin (LYRICA) 50 MG capsule Take 50 mg by mouth 3 (three) times daily.   03/24/2021 at Unknown time  . senna-docusate (SENOKOT-S) 8.6-50 MG tablet 1 tablet once daily      Scheduled: . aspirin EC  81 mg Oral Daily  . atorvastatin  80 mg Oral Daily  . metoprolol succinate  150 mg Oral Daily  . pregabalin  50 mg Oral TID   Continuous: . piperacillin-tazobactam (ZOSYN)  IV 3.375 g (03/25/21 3220)   URK:YHCWCBJS injection, nitroGLYCERIN, ondansetron (ZOFRAN) IV  Allergies  Allergen Reactions  . Brilinta [Ticagrelor] Diarrhea and Nausea Only    Nausea and severe diarrhea- general weakness. Patient says does not want to take again  . Budesonide Palpitations  . Albuterol Other (See Comments)    Heart racing      ROS:  A comprehensive review of systems was negative except for: Gastrointestinal: positive for abdominal pain, constipation and nausea  Blood pressure (!) 141/72, pulse 71, temperature 97.7 F (36.5 C), temperature source Oral, resp. rate 18, height 5' (1.524 m), weight 90.8 kg, SpO2 100 %. Physical Exam Vitals reviewed.  Constitutional:      Appearance: She is obese.  HENT:     Head: Normocephalic.  Eyes:     Extraocular Movements:  Extraocular movements intact.  Cardiovascular:     Rate and Rhythm: Normal rate.  Pulmonary:     Effort: Pulmonary effort is normal.  Abdominal:     Palpations: Abdomen is soft.     Tenderness: There is abdominal tenderness in the right upper quadrant and epigastric area.  Skin:    General: Skin is warm.  Neurological:     General: No focal deficit present.     Mental Status:  She is alert and oriented to person, place, and time.  Psychiatric:        Mood and Affect: Mood normal.        Behavior: Behavior normal.     Results: Results for orders placed or performed during the hospital encounter of 03/24/21 (from the past 48 hour(s))  CBC with Differential     Status: Abnormal   Collection Time: 03/24/21 10:01 PM  Result Value Ref Range   WBC 11.3 (H) 4.0 - 10.5 K/uL   RBC 3.86 (L) 3.87 - 5.11 MIL/uL   Hemoglobin 12.6 12.0 - 15.0 g/dL   HCT 37.0 36.0 - 46.0 %   MCV 95.9 80.0 - 100.0 fL   MCH 32.6 26.0 - 34.0 pg   MCHC 34.1 30.0 - 36.0 g/dL   RDW 13.9 11.5 - 15.5 %   Platelets 261 150 - 400 K/uL   nRBC 0.0 0.0 - 0.2 %   Neutrophils Relative % 79 %   Neutro Abs 8.9 (H) 1.7 - 7.7 K/uL   Lymphocytes Relative 14 %   Lymphs Abs 1.6 0.7 - 4.0 K/uL   Monocytes Relative 5 %   Monocytes Absolute 0.6 0.1 - 1.0 K/uL   Eosinophils Relative 1 %   Eosinophils Absolute 0.2 0.0 - 0.5 K/uL   Basophils Relative 0 %   Basophils Absolute 0.1 0.0 - 0.1 K/uL   Immature Granulocytes 1 %   Abs Immature Granulocytes 0.06 0.00 - 0.07 K/uL    Comment: Performed at [facility], [address] [place], [place] [address]  Comprehensive metabolic panel     Status: Abnormal   Collection Time: 03/24/21 10:01 PM  Result Value Ref Range   Sodium 139 135 - 145 mmol/L   Potassium 3.3 (L) 3.5 - 5.1 mmol/L   Chloride 99 98 - 111 mmol/L   CO2 28 22 - 32 mmol/L   Glucose, Bld 180 (H) 70 - 99 mg/dL    Comment: Glucose reference range applies only to samples taken after fasting for at least 8 hours.   BUN  24 (H) 8 - 23 mg/dL   Creatinine, Ser 0.75 0.44 - 1.00 mg/dL   Calcium 10.0 8.9 - 10.3 mg/dL   Total Protein 6.6 6.5 - 8.1 g/dL   Albumin 3.6 3.5 - 5.0 g/dL   AST 123 (H) 15 - 41 U/L   ALT 52 (H) 0 - 44 U/L   Alkaline Phosphatase 92 38 - 126 U/L   Total Bilirubin 2.5 (H) 0.3 - 1.2 mg/dL   GFR, Estimated >60 >60 mL/min    Comment: (NOTE) Calculated using the CKD-EPI Creatinine Equation (2021)    Anion gap 12 5 - 15    Comment: Performed at [facility], [address] [place], [place] [address]  Lipase, blood     Status: None   Collection Time: 03/24/21 10:01 PM  Result Value Ref Range   Lipase 20 11 - 51 U/L    Comment: Performed at [facility], [address] , [place] [address]  Urinalysis, Routine w reflex microscopic Urine, Clean Catch     Status: Abnormal   Collection Time: 03/24/21 10:01 PM  Result Value Ref Range   Color, Urine AMBER (A) YELLOW    Comment: BIOCHEMICALS MAY BE AFFECTED BY COLOR   APPearance CLOUDY (A) CLEAR   Specific Gravity, Urine 1.038 (H) 1.005 - 1.030   pH 5.0 5.0 - 8.0   Glucose, UA 50 (A) NEGATIVE mg/dL   Hgb urine dipstick   NEGATIVE NEGATIVE   Bilirubin Urine NEGATIVE NEGATIVE   Ketones, ur 5 (A) NEGATIVE mg/dL   Protein, ur 100 (A) NEGATIVE mg/dL   Nitrite NEGATIVE NEGATIVE   Leukocytes,Ua NEGATIVE NEGATIVE   RBC / HPF 11-20 0 - 5 RBC/hpf   WBC, UA 21-50 0 - 5 WBC/hpf   Bacteria, UA NONE SEEN NONE SEEN   Squamous Epithelial / LPF 0-5 0 - 5   Ca Oxalate Crys, UA PRESENT     Comment: Performed at [facility] [place] [facility], [address] [place], [place] [address]  Resp Panel by RT-PCR (Flu A&B, Covid) Nasopharyngeal Swab     Status: None   Collection Time: 03/25/21  1:48 AM   Specimen: Nasopharyngeal Swab; Nasopharyngeal(NP) swabs in vial transport medium  Result Value Ref Range   SARS Coronavirus 2 by RT PCR NEGATIVE NEGATIVE    Comment: (NOTE) SARS-CoV-2 target nucleic acids are NOT DETECTED.  The SARS-CoV-2 RNA is generally detectable in  upper respiratory specimens during the acute phase of infection. The lowest concentration of SARS-CoV-2 viral copies this assay can detect is 138 copies/mL. A negative result does not preclude SARS-Cov-2 infection and should not be used as the sole basis for treatment or other patient management decisions. A negative result may occur with  improper specimen collection/handling, submission of specimen other than nasopharyngeal swab, presence of viral mutation(s) within the areas targeted by this assay, and inadequate number of viral copies(<138 copies/mL). A negative result must be combined with clinical observations, patient history, and epidemiological information. The expected result is Negative.  Fact Sheet for Patients:  EntrepreneurPulse.com.au  Fact Sheet for Healthcare Providers:  IncredibleEmployment.be  This test is no t yet approved or cleared by the [place] FDA and  has been authorized for detection and/or diagnosis of SARS-CoV-2 by FDA under an Emergency Use Authorization (EUA). This EUA will remain  in effect (meaning this test can be used) for the duration of the COVID-19 declaration under Section 564(b)(1) of the Act, 21 U.S.C.section 360bbb-3(b)(1), unless the authorization is terminated  or revoked sooner.       Influenza A by PCR NEGATIVE NEGATIVE   Influenza B by PCR NEGATIVE NEGATIVE    Comment: (NOTE) The Xpert Xpress SARS-CoV-2/FLU/RSV plus assay is intended as an aid in the diagnosis of influenza from Nasopharyngeal swab specimens and should not be used as a sole basis for treatment. Nasal washings and aspirates are unacceptable for Xpert Xpress SARS-CoV-2/FLU/RSV testing.  Fact Sheet for Patients: EntrepreneurPulse.com.au  Fact Sheet for Healthcare Providers: IncredibleEmployment.be  This test is not yet approved or cleared by the [place] FDA and has been authorized  for detection and/or diagnosis of SARS-CoV-2 by FDA under an Emergency Use Authorization (EUA). This EUA will remain in effect (meaning this test can be used) for the duration of the COVID-19 declaration under Section 564(b)(1) of the Act, 21 U.S.C. section 360bbb-3(b)(1), unless the authorization is terminated or revoked.  Performed at [facility], [address], [place], [place] [address]   Comprehensive metabolic panel     Status: Abnormal   Collection Time: 03/25/21  4:55 AM  Result Value Ref Range   Sodium 142 135 - 145 mmol/L   Potassium 3.6 3.5 - 5.1 mmol/L   Chloride 101 98 - 111 mmol/L   CO2 32 22 - 32 mmol/L   Glucose, Bld 134 (H) 70 - 99 mg/dL    Comment: Glucose reference range applies only to samples taken after fasting for at least 8 hours.  BUN 22 8 - 23 mg/dL   Creatinine, Ser 0.76 0.44 - 1.00 mg/dL   Calcium 9.4 8.9 - 10.3 mg/dL   Total Protein 5.4 (L) 6.5 - 8.1 g/dL   Albumin 2.9 (L) 3.5 - 5.0 g/dL   AST 517 (H) 15 - 41 U/L   ALT 259 (H) 0 - 44 U/L   Alkaline Phosphatase 99 38 - 126 U/L   Total Bilirubin 1.6 (H) 0.3 - 1.2 mg/dL   GFR, Estimated >60 >60 mL/min    Comment: (NOTE) Calculated using the CKD-EPI Creatinine Equation (2021)    Anion gap 9 5 - 15    Comment: Performed at [facility], [address] [place], [place] [address]  CBC     Status: Abnormal   Collection Time: 03/25/21  4:55 AM  Result Value Ref Range   WBC 4.7 4.0 - 10.5 K/uL   RBC 3.30 (L) 3.87 - 5.11 MIL/uL   Hemoglobin 10.8 (L) 12.0 - 15.0 g/dL   HCT 33.2 (L) 36.0 - 46.0 %   MCV 100.6 (H) 80.0 - 100.0 fL   MCH 32.7 26.0 - 34.0 pg   MCHC 32.5 30.0 - 36.0 g/dL   RDW 14.1 11.5 - 15.5 %   Platelets 202 150 - 400 K/uL   nRBC 0.0 0.0 - 0.2 %    Comment: Performed at [facility], [address] [place], [place] [address]  Protime-INR     Status: None   Collection Time: 03/25/21  4:55 AM  Result Value Ref Range   Prothrombin Time 13.4 11.4 - 15.2 seconds   INR 1.0 0.8 - 1.2     Comment: (NOTE) INR goal varies based on device and disease states. Performed at [facility], [address] [place], [place] [address]   APTT     Status: None   Collection Time: 03/25/21  4:55 AM  Result Value Ref Range   aPTT 26 24 - 36 seconds    Comment: Performed at [facility], [address], [place], [place] [address]  Magnesium     Status: None   Collection Time: 03/25/21  4:55 AM  Result Value Ref Range   Magnesium 1.7 1.7 - 2.4 mg/dL    Comment: Performed at [facility], [address] [place], [place] [address]  Phosphorus     Status: None   Collection Time: 03/25/21  4:55 AM  Result Value Ref Range   Phosphorus 4.6 2.5 - 4.6 mg/dL    Comment: Performed at [facility], [address], [place], [place] [address]  Hemoglobin A1c     Status: None   Collection Time: 03/25/21  4:55 AM  Result Value Ref Range   Hgb A1c MFr Bld 5.5 4.8 - 5.6 %    Comment: (NOTE) Pre diabetes:          5.7%-6.4%  Diabetes:              >6.4%  Glycemic control for   <7.0% adults with diabetes    Mean Plasma Glucose 111.15 mg/dL    Comment: Performed at [facility] [address], [place],  [address]    CT ABDOMEN PELVIS W CONTRAST  Result Date: 03/25/2021 CLINICAL DATA:  Acute abdominal pain. History of MI, hypertension, congestive heart failure, breast cancer. EXAM: CT ABDOMEN AND PELVIS WITH CONTRAST TECHNIQUE: Multidetector CT imaging of the abdomen and pelvis was performed using the standard protocol following bolus administration of intravenous contrast. CONTRAST:  165mL OMNIPAQUE IOHEXOL 300  MG/ML  SOLN COMPARISON:  02/19/2021 FINDINGS: Lower chest: Lung bases are clear. Hepatobiliary: Gallbladder wall thickening and edema with infiltration into the pericholecystic fat. Small stones layering in the gallbladder. Extrahepatic bile duct dilatation with suggestion of stones in the distal common bile duct. Moderate intrahepatic bile duct dilatation. No focal liver lesions.  Small amount of perihepatic ascites. Pancreas: Unremarkable. No pancreatic ductal dilatation or surrounding inflammatory changes. Spleen: Normal in size without focal abnormality. Adrenals/Urinary Tract: No adrenal gland nodules. Complex hypoenhancing lesion demonstrated in the left kidney measuring 1.9 cm diameter. This may represent a renal cell carcinoma. Similar appearance to previous study. No hydronephrosis or hydroureter. Bladder wall is thickened, probably due to under distention although cystitis could also have this appearance. Stomach/Bowel: Stomach, small bowel, and colon are not abnormally distended. Scattered colonic diverticula without evidence of diverticulitis. Infiltration and wall thickening around the proximal duodenum probably is referred inflammation from the gallbladder. Duodenitis could also be present. Appendix is not identified. Vascular/Lymphatic: Aortic atherosclerosis. No enlarged abdominal or pelvic lymph nodes. Reproductive: Uterus is surgically absent. No abnormal adnexal masses. Other: No free air in the abdomen. Moderate-sized periumbilical hernia containing fat. Musculoskeletal: Degenerative changes in the spine. Lumbar scoliosis convex towards the left. Sclerotic bone lesions consistent with metastatic disease and vertebral compression of L2. IMPRESSION: 1. Cholelithiasis with changes of acute cholecystitis. Bile duct dilatation with probable stones in the distal common bile duct. 2. Infiltration and wall thickening around the proximal duodenum probably is referred inflammation from the gallbladder. Duodenitis could also have this appearance. 3. Complex hypoenhancing lesion in the left kidney may represent a renal cell carcinoma. No change since prior study. 4. Sclerotic bone lesions consistent with metastatic disease. Compression of L2. 5. Moderate-sized periumbilical hernia containing fat. 6. Aortic atherosclerosis. Aortic Atherosclerosis (ICD10-I70.0). Electronically Signed    By: Lucienne Capers M.D.   On: 03/25/2021 00:50     Assessment & Plan:  CHARELL FAULK is a 74 y.o. female with concern for possible choledocholithiasis and cholecystitis on CT scan. Liver enzymes improving this am. She has a significant cardiac history with EF 35%.  She is currently feeling a lot better. Discussed potential need for ERCP. Discussed her pain pump which she says was placed recently and after her previous MRCPs. Discussed that cardiology was going to see her and that GI was seeing her to regarding the possibility of the choledocholithiasis.    Agree with antibiotics Cardiology consult GI consulted for possible ERCP/ may have to do MRCP if patient pump is compatible   All questions were answered to the satisfaction of the patient.    [name] 03/25/2021, 1:54 PM

## 2021-03-25 NOTE — Consult Note (Signed)
Cardiology Consultation:   Patient ID: Stacey [name] MRN: [id number]; DOB: Feb 02, 1946  Admit date: 03/24/2021 Date of Consult: 03/25/2021  PCP:  [place], [place]  Cardiologist:  Carlyle Dolly, MD  Advanced Practice Provider:  No care team member to display Electrophysiologist:  None  [phone]   Chief complaint:    Abdominal pain and nausea Reason for consult:    Risk stratification prior to non-cardiac surgery  History of Present Illness:   Stacey [name] is a 75 yo female with a history of ischemic cardiomyopathy (echo 03/2020 - EF [id number]), diastolic dysfunction, CAD, mild-mod aortic stenosis, chronic leg swelling (likely due to venous insufficiency), chronic LBBB, atrial fibrillation, metastatic breast cancer, intrathecal pump for pain management, prior GI bleed (in the setting of supra-therapeutic INR), COPD, HL, HTN, and morbid obesity, who is admitted to the hospital with abdominal pain and nausea. She also has abnormal liver tests.   She also has CAD with DES to ramus, late stentthrombosis requiring PTCA in 2006,and DES to RCA in Dec 2018. She had SOB on Brillinta. Now on ASA and plavix.  She denies any chest pain, resting SOB, orthopnea or PND. She also does not have palpitations, pre-syncope or syncope.  Relevant cardiac testing: May 2021 echo 1. Left ventricular ejection fraction, by estimation, is 35 to 40%. The left ventricle has moderately decreased function. The left ventricle demonstrates global hypokinesis. Left ventricular diastolic parameters are consistent with Grade I diastolic dysfunction (impaired relaxation). Elevated left ventricular end-diastolic pressure. 2. Right ventricular systolic function is normal. The right ventricular size is normal. There is normal pulmonary artery systolic pressure. 3. The mitral valve is degenerative. Mild mitral valve regurgitation. 4. The aortic valve is tricuspid. Aortic valve  regurgitation is not visualized. Mild to moderate aortic valve stenosis. Aortic valve area, by VTI measures 1.22 cm. Aortic valve mean gradient measures 10.3 mmHg. Aortic valve Vmax measures 2.14 m/s. 5. The inferior vena cava is normal in size with greater than 50% respiratory variability, suggesting right atrial pressure of 3 mmHg. Comparison(s): Changes from prior study are noted. Echocardiogram done 12/01/18 showed an EF of 25% with mild AS and an AV Peak Grad of 18 mm Hg.  Jan 2020 echo - Left ventricle: The cavity size was normal. Wall thickness was normal. Systolic function was severely reduced. The estimated ejection fraction was in the range of 20% to 25%. Diffuse hypokinesis. Doppler parameters are consistent with restrictive physiology, indicative of decreased left ventricular diastolic compliance and/or increased left atrial pressure. Doppler parameters are consistent with high ventricular filling pressure. - Aortic valve: There was mild stenosis. Valve area (VTI): 1.69 cm^2. Valve area (Vmax): 1.73 cm^2. Valve area (Vmean): 1.74 cm^2. - Mitral valve: Calcified annulus. There was mild regurgitation. - Left atrium: The atrium was mildly dilated. - Atrial septum: No defect or patent foramen ovale was identified. - Tricuspid valve: There was mild regurgitation. - Pulmonary arteries: PA peak pressure: 40 mm Hg (S).  Jan 2019 echo - Left ventricle: The cavity size was normal. There was mild concentric hypertrophy. Systolic function was moderately to   severely reduced. The estimated ejection fraction was in the range of 30% to 35%. Diffuse hypokinesis. Doppler parameters are consistent with abnormal left ventricular relaxation (grade 1 diastolic dysfunction). - Aortic valve: Probably trileaflet; moderately thickened, severely calcified leaflets. There was mild stenosis. Peak gradient (S): 13 mm Hg. Valve area (Vmax): 1.26 cm^2. - Mitral valve:  Calcified annulus. Mildly thickened leaflets . There  was mild regurgitation. - Right ventricle: The cavity size was normal. Wall thickness was normal. Systolic function was normal. - Right atrium: The atrium was normal in size. - Pulmonary arteries: Systolic pressure could not be accurately estimated. - Inferior vena cava: The vessel was normal in size. - Pericardium, extracardiac: There was no pericardial effusion. Impressions: - Since the last study on 11/24/17 LVEF has decreased from 50% to 30-35% with diffuse hypokinesis.  10/2017 echo - Left ventricle: The cavity size was normal. There was mild concentric hypertrophy. Systolic function was normal. The estimated ejection fraction was in the range of 50% to 55%. There was an increased relative contribution of atrial contraction to ventricular filling. Doppler parameters are consistent with abnormal left ventricular relaxation (grade 1 diastolic dysfunction). - Aortic valve: There was mild stenosis. Valve area (VTI): 1.66 cm^2. Valve area (Vmax): 1.71 cm^2. Valve area (Vmean): 1.62 cm^2. - Mitral valve: Calcified annulus. Mildly thickened leaflets . - Pulmonary arteries: PA peak pressure: 32 mm Hg (S).  10/2017 cath  Ost Ramus to Ramus lesion is 100% stenosed.  Mid RCA lesion is 90% stenosed.  A drug-eluting stent was successfully placed using a STENT SYNERGY DES 2.5X16.  Post intervention, there is a 0% residual stenosis.  LV end diastolic pressure is normal.  1. 2 vessel obstructive CAD - 100% proximal ramus intermediate at site of prior stent. I think this is a CTO and she has good collaterals.  - 90% ulcerative mid RCA 2. Normal LVEDP 3. Successful PCI of the mid RCA with DES   Past Medical History:  Diagnosis Date  . CAD in native artery    a. DES to ramus 2005 with late stent thrombosis 2006 tx with PTCA. 12/18 PCI/DES x1 to mRCA, EF 50-55%  . CHF (congestive heart failure)  ([place])   . Chronic pain   . COPD (chronic obstructive pulmonary disease) ([place])   . Hyperlipidemia   . Hypertension   . Left bundle branch block   . Lymphedema   . Metastatic breast cancer ([place])    a. to bone.  . MI (myocardial infarction) ([place])   . Mild aortic stenosis 10/2017  . Morbid obesity ([place])   . PAF (paroxysmal atrial fibrillation) ([place])   . PSVT (paroxysmal supraventricular tachycardia) ([place])    a. per [facility] notes, seen on event monitor in 2014.  . Pulmonary nodules     Past Surgical History:  Procedure Laterality Date  . CORONARY STENT INTERVENTION N/A 11/26/2017   Procedure: CORONARY STENT INTERVENTION;  Surgeon: Martinique, Peter M, MD;  Location: [facility];  Service: Cardiovascular;  Laterality: N/A;  . CORONARY STENT PLACEMENT    . intrathecal pain pump    . LEFT HEART CATH AND CORONARY ANGIOGRAPHY N/A 11/26/2017   Procedure: LEFT HEART CATH AND CORONARY ANGIOGRAPHY;  Surgeon: Martinique, Peter M, MD;  Location: [facility];  Service: Cardiovascular;  Laterality: N/A;  . VASCULAR SURGERY       Home Medications:  Prior to Admission medications   Medication Sig Start Date End Date Taking? Authorizing Provider  acetaminophen (TYLENOL) 650 MG CR tablet Take by mouth.   Yes [provider]  aspirin EC 81 MG tablet Take 81 mg by mouth daily. Swallow whole.   Yes [provider]  atorvastatin (LIPITOR) 80 MG tablet Take 1 tablet (80 mg total) by mouth daily. 05/28/20  Yes BranchAlphonse Guild, MD  b complex vitamins tablet Take 1 tablet by mouth every morning. 07/05/20  Yes Florencia Reasons, MD  carboxymethylcellulose (REFRESH PLUS) 0.5 % SOLN Place 1 drop into both eyes in the morning, at noon, in the evening, and at bedtime.   Yes [provider]  clopidogrel (PLAVIX) 75 MG tablet Take 75 mg by mouth daily.   Yes [provider]  diclofenac Sodium (VOLTAREN) 1 % GEL Apply 2 g topically 4 (four) times daily. 10/06/20 10/06/21 Yes [provider]  DULoxetine (CYMBALTA) 30 MG capsule Take 3 capsules (90 mg total) by mouth daily. Patient taking differently: Take 30 mg by mouth daily. 07/06/20  Yes Florencia Reasons, MD  losartan (COZAAR) 50 MG tablet Take 1 tablet by mouth daily. 03/22/21  Yes [provider]  metoprolol succinate (TOPROL-XL) 100 MG 24 hr tablet TAKE 1 AND 1/2 TABLETS DAILY Patient taking differently: Take 150 mg by mouth daily. 03/28/20  Yes BranchAlphonse Guild, MD  Multiple Vitamin (MULTIVITAMIN ADULT PO) Take 1 tablet by mouth daily.   Yes [provider]  Multiple Vitamins-Minerals (EQ VISION FORMULA 50+ PO) Take 1 tablet by mouth every morning.   Yes [provider]  nitroGLYCERIN (NITROSTAT) 0.4 MG SL tablet Place 1 tablet (0.4 mg total) under the tongue every 5 (five) minutes x 3 doses as needed for chest pain (if no relief after 3rd dose, proceed to the ED or call 911). 03/22/20  Yes Branch, Alphonse Guild, MD  oxyCODONE (OXY IR/ROXICODONE) 5 MG immediate release tablet Take 5 mg by mouth every 6 (six) hours as needed for moderate pain. 12/27/20  Yes [provider]  PAIN MANAGEMENT INTRATHECAL, IT, PUMP by Intrathecal route. 02/21/21  Yes [provider]  polyethylene glycol powder (GLYCOLAX/MIRALAX) 17 GM/SCOOP powder Take 17 g by mouth daily as needed for moderate constipation.    Yes [provider]  pregabalin (LYRICA) 50 MG capsule Take 50 mg by mouth 3 (three) times daily. 01/26/21  Yes [provider]  senna-docusate (SENOKOT-S) 8.6-50 MG tablet 1 tablet once daily   Yes [provider]    Inpatient Medications: Scheduled Meds: . aspirin EC  81 mg Oral Daily  . atorvastatin  80 mg Oral Daily  . metoprolol succinate  150 mg Oral Daily  . pregabalin  50 mg Oral TID   Continuous Infusions: . piperacillin-tazobactam (ZOSYN)  IV 3.375 g (03/25/21 [id number])   PRN Meds: morphine injection, nitroGLYCERIN, ondansetron (ZOFRAN) IV  Allergies:     Allergies  Allergen Reactions  . Brilinta [Ticagrelor] Diarrhea and Nausea Only    Nausea and severe diarrhea- general weakness. Patient says does not want to take again  . Budesonide Palpitations  . Albuterol Other (See Comments)    Heart racing     Social History:   Social History   Socioeconomic History  . Marital status: Single    Spouse name: Not on file  . Number of children: Not on file  . Years of education: Not on file  . Highest education level: Not on file  Occupational History  . Occupation: Retired  Tobacco Use  . Smoking status: Former Smoker    Packs/day: 2.00    Years: 18.00    Pack years: 36.00    Types: Cigarettes    Quit date: 12/01/1980    Years since quitting: 40.3  . Smokeless tobacco: Never Used  Vaping Use  . Vaping Use: Never used  Substance and Sexual Activity  . Alcohol use: No  . Drug use: Yes    Types: Oxycodone, Morphine  Comment: takes methadone  . Sexual activity: Not Currently  Other Topics Concern  . Not on file  Social History Narrative  . Not on file   Social Determinants of Health   Financial Resource Strain: Not on file  Food Insecurity: Not on file  Transportation Needs: No Transportation Needs  . Lack of Transportation (Medical): No  . Lack of Transportation (Non-Medical): No  Physical Activity: Inactive  . Days of Exercise per Week: 0 days  . Minutes of Exercise per Session: 0 min  Stress: Not on file  Social Connections: Not on file  Intimate Partner Violence: Not At Risk  . Fear of Current or Ex-Partner: No  . Emotionally Abused: No  . Physically Abused: No  . Sexually Abused: No    Family History:    Family History  Problem Relation Age of Onset  . CAD Father 55  . Heart attack Father   . COPD Sister   . CAD Paternal Grandmother   . Sudden Cardiac Death Neg Hx   . Colon polyps Neg Hx   . Colon cancer Neg Hx      ROS:  Please see the history of present illness.  All other ROS reviewed and  negative.     Physical Exam/Data:   Vitals:   03/25/21 0325 03/25/21 0501 03/25/21 0544 03/25/21 1500  BP: (!) 148/74  (!) 141/72 (!) 104/46  Pulse: 78  71 80  Resp: 17  18 17   Temp: (!) 97.5 F (36.4 C) 98 F (36.7 C) 97.7 F (36.5 C) 98.5 F (36.9 C)  TempSrc: Oral Axillary Oral Oral  SpO2: 100%  100% 99%  Weight:      Height:        Intake/Output Summary (Last 24 hours) at 03/25/2021 1539 Last data filed at 03/25/2021 0900 Gross per 24 hour  Intake 1100 ml  Output 400 ml  Net 700 ml   Last 3 Weights 03/25/2021 03/24/2021 03/11/2021  Weight (lbs) 200 lb 2.8 oz 195 lb 195 lb  Weight (kg) 90.8 kg 88.451 kg 88.451 kg     Body mass index is 39.09 kg/m.  General:  Well nourished, well developed, in no acute distress, obese HEENT: normal Lymph: no adenopathy Neck: no JVD Endocrine:  No thryomegaly Vascular: No carotid bruits; FA pulses 2+ bilaterally without bruits  Cardiac:  2/6 systolic murmur, regular rate rhythm Lungs:  clear to auscultation bilaterally, no wheezing, rhonchi or rales  Abd: soft, nontender, no hepatomegaly  Ext: no edema Musculoskeletal:  No deformities, BUE and BLE strength normal and equal Skin: warm and dry  Neuro:  CNs 2-12 intact, no focal abnormalities noted Psych:  Normal affect    Laboratory Data:  High Sensitivity Troponin:  No results for input(s): TROPONINIHS in the last 720 hours.   Chemistry Recent Labs  Lab 03/24/21 2201 03/25/21 0455  NA 139 142  K 3.3* 3.6  CL 99 101  CO2 28 32  GLUCOSE 180* 134*  BUN 24* 22  CREATININE 0.75 0.76  CALCIUM 10.0 9.4  GFRNONAA >60 >60  ANIONGAP 12 9    Recent Labs  Lab 03/24/21 2201 03/25/21 0455  PROT 6.6 5.4*  ALBUMIN 3.6 2.9*  AST 123* 517*  ALT 52* 259*  ALKPHOS 92 99  BILITOT 2.5* 1.6*   Hematology Recent Labs  Lab 03/24/21 2201 03/25/21 0455  WBC 11.3* 4.7  RBC 3.86* 3.30*  HGB 12.6 10.8*  HCT 37.0 33.2*  MCV 95.9 100.6*  MCH 32.6 32.7  MCHC 34.1 32.5  RDW 13.9  14.1  PLT 261 202   BNPNo results for input(s): BNP, PROBNP in the last 168 hours.  DDimer No results for input(s): DDIMER in the last 168 hours.   Radiology/Studies:  CT ABDOMEN PELVIS W CONTRAST  Result Date: 03/25/2021 CLINICAL DATA:  Acute abdominal pain. History of MI, hypertension, congestive heart failure, breast cancer. EXAM: CT ABDOMEN AND PELVIS WITH CONTRAST TECHNIQUE: Multidetector CT imaging of the abdomen and pelvis was performed using the standard protocol following bolus administration of intravenous contrast. CONTRAST:  144mL OMNIPAQUE IOHEXOL 300 MG/ML  SOLN COMPARISON:  02/19/2021 FINDINGS: Lower chest: Lung bases are clear. Hepatobiliary: Gallbladder wall thickening and edema with infiltration into the pericholecystic fat. Small stones layering in the gallbladder. Extrahepatic bile duct dilatation with suggestion of stones in the distal common bile duct. Moderate intrahepatic bile duct dilatation. No focal liver lesions. Small amount of perihepatic ascites. Pancreas: Unremarkable. No pancreatic ductal dilatation or surrounding inflammatory changes. Spleen: Normal in size without focal abnormality. Adrenals/Urinary Tract: No adrenal gland nodules. Complex hypoenhancing lesion demonstrated in the left kidney measuring 1.9 cm diameter. This may represent a renal cell carcinoma. Similar appearance to previous study. No hydronephrosis or hydroureter. Bladder wall is thickened, probably due to under distention although cystitis could also have this appearance. Stomach/Bowel: Stomach, small bowel, and colon are not abnormally distended. Scattered colonic diverticula without evidence of diverticulitis. Infiltration and wall thickening around the proximal duodenum probably is referred inflammation from the gallbladder. Duodenitis could also be present. Appendix is not identified. Vascular/Lymphatic: Aortic atherosclerosis. No enlarged abdominal or pelvic lymph nodes. Reproductive: Uterus is  surgically absent. No abnormal adnexal masses. Other: No free air in the abdomen. Moderate-sized periumbilical hernia containing fat. Musculoskeletal: Degenerative changes in the spine. Lumbar scoliosis convex towards the left. Sclerotic bone lesions consistent with metastatic disease and vertebral compression of L2. IMPRESSION: 1. Cholelithiasis with changes of acute cholecystitis. Bile duct dilatation with probable stones in the distal common bile duct. 2. Infiltration and wall thickening around the proximal duodenum probably is referred inflammation from the gallbladder. Duodenitis could also have this appearance. 3. Complex hypoenhancing lesion in the left kidney may represent a renal cell carcinoma. No change since prior study. 4. Sclerotic bone lesions consistent with metastatic disease. Compression of L2. 5. Moderate-sized periumbilical hernia containing fat. 6. Aortic atherosclerosis. Aortic Atherosclerosis (ICD10-I70.0). Electronically Signed   By: Lucienne Capers M.D.   On: 03/25/2021 00:50   ECHOCARDIOGRAM COMPLETE  Result Date: 03/25/2021    ECHOCARDIOGRAM REPORT   Patient Name:   LASHANDRA REASNER Date of Exam: 03/25/2021 Medical Rec #:  [id number]         Height:       60.0 in Accession #:    [id number]        Weight:       200.2 lb Date of Birth:  [birth year]          BSA:          1.867 m Patient Age:    36 years          BP:           141/72 mmHg Patient Gender: F                 HR:           71 bpm. Exam Location:  [facility] Procedure: 2D Echo, Cardiac Doppler and Color Doppler Indications:    CHF  History:  Patient has prior history of Echocardiogram examinations, most                 recent 04/18/2020. CHF, CAD, COPD, Aortic Valve Disease and                 Aortic stenosis; Risk Factors:Hypertension, Dyslipidemia and                 morbid obestiy. Metastatic breast cancer.  Sonographer:    Dustin Flock RDCS Referring Phys: [phone] DAVID TAT IMPRESSIONS  1. Left ventricular ejection  fraction, by estimation, is 50 to 55%. The left ventricle has low normal function. The left ventricle has no regional wall motion abnormalities. Left ventricular diastolic parameters are consistent with Grade I diastolic dysfunction (impaired relaxation).  2. Right ventricular systolic function is normal. The right ventricular size is normal. There is mildly elevated pulmonary artery systolic pressure. The estimated right ventricular systolic pressure is 0000000 mmHg.  3. Left atrial size was upper normal.  4. The mitral valve is grossly normal. Trivial mitral valve regurgitation.  5. The aortic valve is tricuspid with some fusion of left and noncoronary cusps. There is moderate calcification of the aortic valve. Aortic valve regurgitation is not visualized. Moderate aortic valve stenosis. Aortic valve mean gradient measures 10.5 mmHg. Dimentionless index 0.31.  6. The inferior vena cava is normal in size with greater than 50% respiratory variability, suggesting right atrial pressure of 3 mmHg. FINDINGS  Left Ventricle: Left ventricular ejection fraction, by estimation, is 50 to 55%. The left ventricle has low normal function. The left ventricle has no regional wall motion abnormalities. The left ventricular internal cavity size was normal in size. There is borderline left ventricular hypertrophy. Abnormal (paradoxical) septal motion, consistent with left bundle branch block. Left ventricular diastolic parameters are consistent with Grade I diastolic dysfunction (impaired relaxation). Right Ventricle: The right ventricular size is normal. No increase in right ventricular wall thickness. Right ventricular systolic function is normal. There is mildly elevated pulmonary artery systolic pressure. The tricuspid regurgitant velocity is 3.24  m/s, and with an assumed right atrial pressure of 3 mmHg, the estimated right ventricular systolic pressure is 0000000 mmHg. Left Atrium: Left atrial size was upper normal. Right Atrium:  Right atrial size was normal in size. Pericardium: There is no evidence of pericardial effusion. Mitral Valve: The mitral valve is grossly normal. Mild mitral annular calcification. Trivial mitral valve regurgitation. Tricuspid Valve: The tricuspid valve is grossly normal. Tricuspid valve regurgitation is mild. Aortic Valve: The aortic valve is tricuspid. There is moderate calcification of the aortic valve. There is moderate aortic valve annular calcification. Aortic valve regurgitation is not visualized. Moderate aortic stenosis is present. Aortic valve mean gradient measures 10.5 mmHg. Aortic valve peak gradient measures 18.1 mmHg. Aortic valve area, by VTI measures 1.09 cm. Pulmonic Valve: The pulmonic valve was grossly normal. Pulmonic valve regurgitation is trivial. Aorta: The aortic root is normal in size and structure. Venous: The inferior vena cava is normal in size with greater than 50% respiratory variability, suggesting right atrial pressure of 3 mmHg. IAS/Shunts: No atrial level shunt detected by color flow Doppler.  LEFT VENTRICLE PLAX 2D LVIDd:         4.35 cm  Diastology LVIDs:         3.18 cm  LV e' medial:    5.44 cm/s LV PW:         1.05 cm  LV E/e' medial:  11.7 LV IVS:  1.08 cm  LV e' lateral:   7.40 cm/s LVOT diam:     2.10 cm  LV E/e' lateral: 8.6 LV SV:         52 LV SV Index:   28 LVOT Area:     3.46 cm  RIGHT VENTRICLE RV Basal diam:  3.00 cm RV S prime:     7.07 cm/s TAPSE (M-mode): 2.1 cm LEFT ATRIUM             Index       RIGHT ATRIUM           Index LA diam:        3.80 cm 2.04 cm/m  RA Area:     11.70 cm LA Vol (A2C):   37.5 ml 20.08 ml/m RA Volume:   32.00 ml  17.14 ml/m LA Vol (A4C):   60.0 ml 32.13 ml/m LA Biplane Vol: 50.8 ml 27.20 ml/m  AORTIC VALVE AV Area (Vmax):    1.10 cm AV Area (Vmean):   1.16 cm AV Area (VTI):     1.09 cm AV Vmax:           213.00 cm/s AV Vmean:          149.000 cm/s AV VTI:            0.477 m AV Peak Grad:      18.1 mmHg AV Mean Grad:       10.5 mmHg LVOT Vmax:         67.90 cm/s LVOT Vmean:        50.000 cm/s LVOT VTI:          0.150 m LVOT/AV VTI ratio: 0.31  AORTA Ao Root diam: 2.70 cm MITRAL VALVE               TRICUSPID VALVE MV Area (PHT): 2.68 cm    TR Peak grad:   42.0 mmHg MV Decel Time: 283 msec    TR Vmax:        324.00 cm/s MV E velocity: 63.80 cm/s MV A velocity: 89.50 cm/s  SHUNTS MV E/A ratio:  0.71        Systemic VTI:  0.15 m                            Systemic Diam: 2.10 cm Rozann Lesches MD Electronically signed by Rozann Lesches MD Signature Date/Time: 03/25/2021/2:24:21 PM    Final    Echocardiogram - 03/25/2021 1. Left ventricular ejection fraction, by estimation, is 50 to 55%. The  left ventricle has low normal function. The left ventricle has no regional  wall motion abnormalities. Left ventricular diastolic parameters are  consistent with Grade I diastolic dysfunction (impaired relaxation).  2. Right ventricular systolic function is normal. The right ventricular  size is normal. There is mildly elevated pulmonary artery systolic  pressure. The estimated right ventricular systolic pressure is 0000000 mmHg.  3. Left atrial size was upper normal.  4. The mitral valve is grossly normal. Trivial mitral valve regurgitation.  5. The aortic valve is tricuspid with some fusion of left and noncoronary  cusps. There is moderate calcification of the aortic valve. Aortic valve  regurgitation is not visualized. Moderate aortic valve stenosis. Aortic  valve mean gradient measures 10.5 mmHg. Dimentionless index 0.31.  6. The inferior vena cava is normal in size with greater than 50%  respiratory variability, suggesting right atrial pressure of 3 mmHg.  Assessment and Plan:   1. Risk stratification prior to non-cardiac surgery The patient is primarily admitted with abdominal pain and nausea, with suspicion of choledocholithiasis and cholecystitis on CT scan. Also had abnormal liver tests (AST 517, ALT 259, T Bili  1.6, Albumin 2.9).  The patient denies any chest pain. Clinically she is not volume overloaded and not in heart failure The echocardiogram from today shows recovered EF (50-55%) with grade I diastolic dysfunction. There is mild to moderate AS. In the past she has had drops in EF down to 20% (as recently as Jan 2020).  -She does not require further cardiac testing prior to her GI /  Abdominal procedure -Given prior CAD - should at least be on one antiplatelet agent (either ASA or clopidogrel) during peri-procedure phase. Prefer ASA. Resume dual-antiplatelet therapy after surgery when deemed safe. -Continue metoprolol succinate given hx of heart failure -Strict I and Os, daily weights, diurese only if signs of volume overload -Maintain serum K >4.0 and Mg >2.0 -Given prior AFIB, should consider tele-monitoring during any procedure/surgery and use AV nodal blockers if she develops a rapid ventricular rate. -Patient states she is not on chronic anticoagulation due to bleeding issues in 2021. Please confirm this with family/care-giver.  Please call us if you have any follow-up questions.   For questions or updates, please contact [place] Please consult [email or web address] for contact info under    Signed, Meade Maw, MD  03/25/2021 3:39 PM

## 2021-03-25 NOTE — Progress Notes (Signed)
Found Stacey [name] laying in her hospital bed with eyes closed and asleep today. She was hard to rouse but did wake. She accepted prayer bedside and spiritual presence. She asked that PC return another time. Chaplain will continue to follow in order to provide spiritual support and to assess for spiritual need.

## 2021-03-26 DIAGNOSIS — K8000 Calculus of gallbladder with acute cholecystitis without obstruction: Secondary | ICD-10-CM | POA: Diagnosis not present

## 2021-03-26 DIAGNOSIS — K8001 Calculus of gallbladder with acute cholecystitis with obstruction: Secondary | ICD-10-CM | POA: Diagnosis not present

## 2021-03-26 DIAGNOSIS — I5042 Chronic combined systolic (congestive) and diastolic (congestive) heart failure: Secondary | ICD-10-CM | POA: Diagnosis not present

## 2021-03-26 DIAGNOSIS — K8050 Calculus of bile duct without cholangitis or cholecystitis without obstruction: Secondary | ICD-10-CM | POA: Diagnosis not present

## 2021-03-26 DIAGNOSIS — E876 Hypokalemia: Secondary | ICD-10-CM | POA: Diagnosis not present

## 2021-03-26 DIAGNOSIS — R748 Abnormal levels of other serum enzymes: Secondary | ICD-10-CM | POA: Diagnosis not present

## 2021-03-26 LAB — COMPREHENSIVE METABOLIC PANEL
ALT: 177 U/L — ABNORMAL HIGH (ref 0–44)
AST: 177 U/L — ABNORMAL HIGH (ref 15–41)
Albumin: 2.8 g/dL — ABNORMAL LOW (ref 3.5–5.0)
Alkaline Phosphatase: 89 U/L (ref 38–126)
Anion gap: 7 (ref 5–15)
BUN: 18 mg/dL (ref 8–23)
CO2: 33 mmol/L — ABNORMAL HIGH (ref 22–32)
Calcium: 9.4 mg/dL (ref 8.9–10.3)
Chloride: 104 mmol/L (ref 98–111)
Creatinine, Ser: 0.85 mg/dL (ref 0.44–1.00)
GFR, Estimated: >60 mL/min (ref >60)
Glucose, Bld: 90 mg/dL (ref 70–99)
Potassium: 3.6 mmol/L (ref 3.5–5.1)
Sodium: 144 mmol/L (ref 135–145)
Total Bilirubin: 1.4 mg/dL — ABNORMAL HIGH (ref 0.3–1.2)
Total Protein: 5.4 g/dL — ABNORMAL LOW (ref 6.5–8.1)

## 2021-03-26 LAB — CBC
HCT: 33.1 % — ABNORMAL LOW (ref 36.0–46.0)
Hemoglobin: 10.6 g/dL — ABNORMAL LOW (ref 12.0–15.0)
MCH: 32.6 pg (ref 26.0–34.0)
MCHC: 32 g/dL (ref 30.0–36.0)
MCV: 101.8 fL — ABNORMAL HIGH (ref 80.0–100.0)
Platelets: 182 10*3/uL (ref 150–400)
RBC: 3.25 MIL/uL — ABNORMAL LOW (ref 3.87–5.11)
RDW: 14.1 % (ref 11.5–15.5)
WBC: 4 10*3/uL (ref 4.0–10.5)
nRBC: 0 % (ref 0.0–0.2)

## 2021-03-26 LAB — MAGNESIUM: Magnesium: 1.9 mg/dL (ref 1.7–2.4)

## 2021-03-26 MED ORDER — ACETAMINOPHEN 325 MG PO TABS
650.0000 mg | ORAL_TABLET | Freq: Four times a day (QID) | ORAL | Status: DC | PRN
  Administered 2021-03-26 – 2021-04-04 (×8): 650 mg via ORAL
  Filled 2021-03-26 (×9): qty 2

## 2021-03-26 MED ORDER — ATORVASTATIN CALCIUM 40 MG PO TABS: 80.0000 mg | ORAL_TABLET | Freq: Every day | ORAL | Status: DC

## 2021-03-26 MED ORDER — GERHARDT'S BUTT CREAM
TOPICAL_CREAM | Freq: Every day | CUTANEOUS | Status: DC | PRN
  Filled 2021-03-26: qty 1

## 2021-03-26 MED ORDER — ASCORBIC ACID 500 MG PO TABS
500.0000 mg | ORAL_TABLET | Freq: Every day | ORAL | Status: DC
  Administered 2021-03-26 – 2021-04-05 (×9): 500 mg via ORAL
  Filled 2021-03-26 (×10): qty 1

## 2021-03-26 MED ORDER — ADULT MULTIVITAMIN W/MINERALS CH
1.0000 | ORAL_TABLET | Freq: Every day | ORAL | Status: DC
  Administered 2021-03-26 – 2021-04-05 (×9): 1 via ORAL
  Filled 2021-03-26 (×10): qty 1

## 2021-03-26 NOTE — Evaluation (Signed)
Physical Therapy Evaluation Patient Details Name: Stacey [name] MRN: [id number] DOB: Dec 06, 1945 Today's Date: 03/26/2021   History of Present Illness  [name] is a 75 y.o. female with c/o abdominal pain, some nausea. CT noted cholelithiasis, possible surgical intervention to follow. PMH: metastatic breast cancer with a pain pump in place, HTN, CAD, CHF, COPD, MI, afib    Clinical Impression  Pt admitted with above diagnosis. Pt reports family assists with bathing, dressing, pericare and perform household chores. Pt reports previously active with HHPT and she has continued therapy with her family at home since being discharged. Pt reports last took steps in Nov 2021, currently working on sitting tolerance at home. Pt currently requiring min A to power up to stand and take 3-4 sidesteps over to chair using RW. Pt is well equipped at home and with good family support, goal is to return home with family assisting and return to [facility] for strengthening. Recommend HHPT and family support 24 hrs, has all DME needs. Pt currently with functional limitations due to the deficits listed below (see PT Problem List). Pt will benefit from skilled PT to increase their independence and safety with mobility to allow discharge to the venue listed below.       Follow Up Recommendations Home health PT;Supervision/Assistance - 24 hour    Equipment Recommendations  None recommended by PT    Recommendations for Other Services       Precautions / Restrictions Precautions Precautions: Fall Restrictions Weight Bearing Restrictions: No      Mobility  Bed Mobility Overal bed mobility: Needs Assistance Bed Mobility: Supine to Sit  Supine to sit: Min assist;HOB elevated  General bed mobility comments: min A to upright trunk, HOB elevated with heavy use of bedrail to assist    Transfers Overall transfer level: Needs assistance Equipment used: Rolling walker (2 wheeled) Transfers: Sit to/from  Stand Sit to Stand: Min assist  General transfer comment: rocking momentum to power up, BUE pulling on RW with therapist bracing RW and assisting to power up at gait belt  Ambulation/Gait Ambulation/Gait assistance: Min assist  Assistive device: Rolling walker (2 wheeled) Gait Pattern/deviations: Step-to pattern;Shuffle Gait velocity: decreased   General Gait Details: short, shuffling sidesteps along bed then over to chair, limited by weakness, heavy UE support on RW  Stairs            Wheelchair Mobility    Modified Rankin (Stroke Patients Only)       Balance Overall balance assessment: Needs assistance Sitting-balance support: Feet supported Sitting balance-Leahy Scale: Fair Sitting balance - Comments: seated EOB   Standing balance support: During functional activity;Bilateral upper extremity supported Standing balance-Leahy Scale: Poor Standing balance comment: reliant on UE support        Pertinent Vitals/Pain Pain Assessment: Faces Faces Pain Scale: Hurts a little bit Pain Location: abdomen Pain Descriptors / Indicators: Grimacing;Guarding;Sore Pain Intervention(s): Limited activity within patient's tolerance;Monitored during session    [place] expects to be discharged to:: Private residence Living Arrangements: Children Available Help at Discharge: Family;Available 24 hours/day Type of Home: House Home Access: Ramped entrance     Home Layout: One level Home Equipment: Walker - 2 wheels;Walker - 4 wheels;Cane - single point;Bedside commode;Hospital bed      Prior Function Level of Independence: Needs assistance   Gait / Transfers Assistance Needed: pt reports walked 9 ft in Nov in rehab and only transfers now, active with HHPT and working on sitting tolerance  ADL's / [employer]  Assistance Needed: pt reports family assists with bathing, dressing, pericare  Comments: pt recently d/c from [place]         Extremity/Trunk Assessment   Upper Extremity Assessment Upper Extremity Assessment: Overall WFL for tasks assessed    Lower Extremity Assessment Lower Extremity Assessment: Generalized weakness    Cervical / Trunk Assessment Cervical / Trunk Assessment: Kyphotic  Communication   Communication: No difficulties  Cognition Arousal/Alertness: Awake/alert Behavior During Therapy: WFL for tasks assessed/performed Overall Cognitive Status: Within Functional Limits for tasks assessed          General Comments      Exercises     Assessment/Plan    PT Assessment Patient needs continued PT services  PT Problem List Decreased strength;Decreased activity tolerance;Decreased balance;Decreased mobility;Decreased cognition;Decreased knowledge of use of DME;Decreased safety awareness;Cardiopulmonary status limiting activity;Obesity;Pain       PT Treatment Interventions DME instruction;Gait training;Functional mobility training;Therapeutic activities;Therapeutic exercise;Balance training;Patient/family education    PT Goals (Current goals can be found in the Care Plan section)  Acute Rehab PT Goals Patient Stated Goal: home with family to assist PT Goal Formulation: With patient Time For Goal Achievement: 04/09/21 Potential to Achieve Goals: Good    Frequency Min 3X/week   Barriers to discharge        Co-evaluation               AM-PAC PT "6 Clicks" Mobility  Outcome Measure Help needed turning from your back to your side while in a flat bed without using bedrails?: A Lot Help needed moving from lying on your back to sitting on the side of a flat bed without using bedrails?: A Lot Help needed moving to and from a bed to a chair (including a wheelchair)?: A Lot Help needed standing up from a chair using your arms (e.g., wheelchair or bedside chair)?: A Lot Help needed to walk in hospital room?: A Lot Help needed climbing 3-5 steps with a railing? : Total 6 Click Score:  11    End of Session Equipment Utilized During Treatment: Gait belt Activity Tolerance: Patient tolerated treatment well Patient left: in chair;with call bell/phone within reach;with nursing/sitter in room Nurse Communication: Mobility status PT Visit Diagnosis: Unsteadiness on feet (R26.81);Other abnormalities of gait and mobility (R26.89);Muscle weakness (generalized) (M62.81)    Time: 8938-1017 PT Time Calculation (min) (ACUTE ONLY): 22 min   Charges:   PT Evaluation $PT Eval Low Complexity: 1 Low           Tori Carson Bogden PT, DPT 03/26/21, 4:05 PM

## 2021-03-26 NOTE — Progress Notes (Signed)
    Cardiology initially consulted for preoperative cardiac clearance for upcoming ERCP. Was cleared to proceed as outlined in full consult note from 03/25/2021. Was recommended to continue at least one antiplatelet peri-procedure then resume DAPT once deemed safe from a surgical perspective.   [facility] will sign off. Please re-consult if needed.  Medication Recommendations: As outlined on 03/25/2021. Other recommendations (labs, testing, etc): None Follow up as an outpatient: Keep routine outpatient follow-up with Dr. Harl Bowie  Signed, Erma Heritage, PA-C 03/26/2021, 8:35 AM Pager: [phone]

## 2021-03-26 NOTE — Progress Notes (Signed)
Subjective: Feeling better this morning. Denies abdominal pain at this time. No nausea or vomiting. No confusion or mental status changes.   Objective: Vital signs in last 24 hours: Temp:  [97.6 F (36.4 C)-98.5 F (36.9 C)] 97.6 F (36.4 C) (04/26 0541) Pulse Rate:  [69-80] 69 (04/26 0541) Resp:  [16-18] 16 (04/26 0541) BP: (100-124)/(46-55) 124/48 (04/26 0541) SpO2:  [81 %-99 %] 81 % (04/26 0541)   General:   Alert and oriented, pleasant Head:  Normocephalic and atraumatic. Eyes:  No icterus, sclera clear. Conjuctiva pink.  Mouth:  Without lesions, mucosa pink and moist.  Neck:  Supple, without thyromegaly or masses.  Heart:  S1, S2 present, no murmurs noted.  Lungs: Clear to auscultation bilaterally, without wheezing, rales, or rhonchi.  Abdomen:  Bowel sounds present, soft, non-distended. Moderate TTP in epigastric area, RUQ, RLQ. Pain pump implanted in RUQ. No rebound or guarding. No masses appreciated  Msk:  Symmetrical without gross deformities. Normal posture. Pulses:  Normal pulses noted. Extremities:  Without clubbing or edema. Neurologic:  Alert and  oriented x4;  grossly normal neurologically. Skin:  Warm and dry, intact without significant lesions.  Cervical Nodes:  No significant cervical adenopathy. Psych:  Alert and cooperative. Normal mood and affect.  Intake/Output from previous day: 04/25 0701 - 04/26 0700 In: 116.5 [IV Piggyback:116.5] Out: 960 [Urine:960] Intake/Output this shift: No intake/output data recorded.  Lab Results: Recent Labs    03/24/21 2201 03/25/21 0455 03/26/21 0700  WBC 11.3* 4.7 4.0  HGB 12.6 10.8* 10.6*  HCT 37.0 33.2* 33.1*  PLT 261 202 182   BMET Recent Labs    03/24/21 2201 03/25/21 0455 03/26/21 0700  NA 139 142 144  K 3.3* 3.6 3.6  CL 99 101 104  CO2 28 32 33*  GLUCOSE 180* 134* 90  BUN 24* 22 18  CREATININE 0.75 0.76 0.85  CALCIUM 10.0 9.4 9.4   LFT Recent Labs    03/24/21 2201 03/25/21 0455  03/26/21 0700  PROT 6.6 5.4* 5.4*  ALBUMIN 3.6 2.9* 2.8*  AST 123* 517* 177*  ALT 52* 259* 177*  ALKPHOS 92 99 89  BILITOT 2.5* 1.6* 1.4*   PT/INR Recent Labs    03/25/21 0455  LABPROT 13.4  INR 1.0    Studies/Results: CT ABDOMEN PELVIS W CONTRAST  Result Date: 03/25/2021 CLINICAL DATA:  Acute abdominal pain. History of MI, hypertension, congestive heart failure, breast cancer. EXAM: CT ABDOMEN AND PELVIS WITH CONTRAST TECHNIQUE: Multidetector CT imaging of the abdomen and pelvis was performed using the standard protocol following bolus administration of intravenous contrast. CONTRAST:  123mL OMNIPAQUE IOHEXOL 300 MG/ML  SOLN COMPARISON:  02/19/2021 FINDINGS: Lower chest: Lung bases are clear. Hepatobiliary: Gallbladder wall thickening and edema with infiltration into the pericholecystic fat. Small stones layering in the gallbladder. Extrahepatic bile duct dilatation with suggestion of stones in the distal common bile duct. Moderate intrahepatic bile duct dilatation. No focal liver lesions. Small amount of perihepatic ascites. Pancreas: Unremarkable. No pancreatic ductal dilatation or surrounding inflammatory changes. Spleen: Normal in size without focal abnormality. Adrenals/Urinary Tract: No adrenal gland nodules. Complex hypoenhancing lesion demonstrated in the left kidney measuring 1.9 cm diameter. This may represent a renal cell carcinoma. Similar appearance to previous study. No hydronephrosis or hydroureter. Bladder wall is thickened, probably due to under distention although cystitis could also have this appearance. Stomach/Bowel: Stomach, small bowel, and colon are not abnormally distended. Scattered colonic diverticula without evidence of diverticulitis. Infiltration and wall thickening around  the proximal duodenum probably is referred inflammation from the gallbladder. Duodenitis could also be present. Appendix is not identified. Vascular/Lymphatic: Aortic atherosclerosis. No enlarged  abdominal or pelvic lymph nodes. Reproductive: Uterus is surgically absent. No abnormal adnexal masses. Other: No free air in the abdomen. Moderate-sized periumbilical hernia containing fat. Musculoskeletal: Degenerative changes in the spine. Lumbar scoliosis convex towards the left. Sclerotic bone lesions consistent with metastatic disease and vertebral compression of L2. IMPRESSION: 1. Cholelithiasis with changes of acute cholecystitis. Bile duct dilatation with probable stones in the distal common bile duct. 2. Infiltration and wall thickening around the proximal duodenum probably is referred inflammation from the gallbladder. Duodenitis could also have this appearance. 3. Complex hypoenhancing lesion in the left kidney may represent a renal cell carcinoma. No change since prior study. 4. Sclerotic bone lesions consistent with metastatic disease. Compression of L2. 5. Moderate-sized periumbilical hernia containing fat. 6. Aortic atherosclerosis. Aortic Atherosclerosis (ICD10-I70.0). Electronically Signed   By: Lucienne Capers M.D.   On: 03/25/2021 00:50   ECHOCARDIOGRAM COMPLETE  Result Date: 03/25/2021    ECHOCARDIOGRAM REPORT   Patient Name:   Stacey [name] Date of Exam: 03/25/2021 Medical Rec #:  [id number]         Height:       60.0 in Accession #:    [id number]        Weight:       200.2 lb Date of Birth:  December 03, 1945          BSA:          1.867 m Patient Age:    75 years          BP:           141/72 mmHg Patient Gender: F                 HR:           71 bpm. Exam Location:  [facility] Procedure: 2D Echo, Cardiac Doppler and Color Doppler Indications:    CHF  History:        Patient has prior history of Echocardiogram examinations, most                 recent 04/18/2020. CHF, CAD, COPD, Aortic Valve Disease and                 Aortic stenosis; Risk Factors:Hypertension, Dyslipidemia and                 morbid obestiy. Metastatic breast cancer.  Sonographer:    Dustin Flock RDCS Referring Phys:  [phone] DAVID TAT IMPRESSIONS  1. Left ventricular ejection fraction, by estimation, is 50 to 55%. The left ventricle has low normal function. The left ventricle has no regional wall motion abnormalities. Left ventricular diastolic parameters are consistent with Grade I diastolic dysfunction (impaired relaxation).  2. Right ventricular systolic function is normal. The right ventricular size is normal. There is mildly elevated pulmonary artery systolic pressure. The estimated right ventricular systolic pressure is 84.6 mmHg.  3. Left atrial size was upper normal.  4. The mitral valve is grossly normal. Trivial mitral valve regurgitation.  5. The aortic valve is tricuspid with some fusion of left and noncoronary cusps. There is moderate calcification of the aortic valve. Aortic valve regurgitation is not visualized. Moderate aortic valve stenosis. Aortic valve mean gradient measures 10.5 mmHg. Dimentionless index 0.31.  6. The inferior vena cava is normal in size with greater than 50% respiratory variability, suggesting  right atrial pressure of 3 mmHg. FINDINGS  Left Ventricle: Left ventricular ejection fraction, by estimation, is 50 to 55%. The left ventricle has low normal function. The left ventricle has no regional wall motion abnormalities. The left ventricular internal cavity size was normal in size. There is borderline left ventricular hypertrophy. Abnormal (paradoxical) septal motion, consistent with left bundle branch block. Left ventricular diastolic parameters are consistent with Grade I diastolic dysfunction (impaired relaxation). Right Ventricle: The right ventricular size is normal. No increase in right ventricular wall thickness. Right ventricular systolic function is normal. There is mildly elevated pulmonary artery systolic pressure. The tricuspid regurgitant velocity is 3.24  m/s, and with an assumed right atrial pressure of 3 mmHg, the estimated right ventricular systolic pressure is 47.4 mmHg. Left  Atrium: Left atrial size was upper normal. Right Atrium: Right atrial size was normal in size. Pericardium: There is no evidence of pericardial effusion. Mitral Valve: The mitral valve is grossly normal. Mild mitral annular calcification. Trivial mitral valve regurgitation. Tricuspid Valve: The tricuspid valve is grossly normal. Tricuspid valve regurgitation is mild. Aortic Valve: The aortic valve is tricuspid. There is moderate calcification of the aortic valve. There is moderate aortic valve annular calcification. Aortic valve regurgitation is not visualized. Moderate aortic stenosis is present. Aortic valve mean gradient measures 10.5 mmHg. Aortic valve peak gradient measures 18.1 mmHg. Aortic valve area, by VTI measures 1.09 cm. Pulmonic Valve: The pulmonic valve was grossly normal. Pulmonic valve regurgitation is trivial. Aorta: The aortic root is normal in size and structure. Venous: The inferior vena cava is normal in size with greater than 50% respiratory variability, suggesting right atrial pressure of 3 mmHg. IAS/Shunts: No atrial level shunt detected by color flow Doppler.  LEFT VENTRICLE PLAX 2D LVIDd:         4.35 cm  Diastology LVIDs:         3.18 cm  LV e' medial:    5.44 cm/s LV PW:         1.05 cm  LV E/e' medial:  11.7 LV IVS:        1.08 cm  LV e' lateral:   7.40 cm/s LVOT diam:     2.10 cm  LV E/e' lateral: 8.6 LV SV:         52 LV SV Index:   28 LVOT Area:     3.46 cm  RIGHT VENTRICLE RV Basal diam:  3.00 cm RV S prime:     7.07 cm/s TAPSE (M-mode): 2.1 cm LEFT ATRIUM             Index       RIGHT ATRIUM           Index LA diam:        3.80 cm 2.04 cm/m  RA Area:     11.70 cm LA Vol (A2C):   37.5 ml 20.08 ml/m RA Volume:   32.00 ml  17.14 ml/m LA Vol (A4C):   60.0 ml 32.13 ml/m LA Biplane Vol: 50.8 ml 27.20 ml/m  AORTIC VALVE AV Area (Vmax):    1.10 cm AV Area (Vmean):   1.16 cm AV Area (VTI):     1.09 cm AV Vmax:           213.00 cm/s AV Vmean:          149.000 cm/s AV VTI:             0.477 m AV Peak Grad:      18.1  mmHg AV Mean Grad:      10.5 mmHg LVOT Vmax:         67.90 cm/s LVOT Vmean:        50.000 cm/s LVOT VTI:          0.150 m LVOT/AV VTI ratio: 0.31  AORTA Ao Root diam: 2.70 cm MITRAL VALVE               TRICUSPID VALVE MV Area (PHT): 2.68 cm    TR Peak grad:   42.0 mmHg MV Decel Time: 283 msec    TR Vmax:        324.00 cm/s MV E velocity: 63.80 cm/s MV A velocity: 89.50 cm/s  SHUNTS MV E/A ratio:  0.71        Systemic VTI:  0.15 m                            Systemic Diam: 2.10 cm Rozann Lesches MD Electronically signed by Rozann Lesches MD Signature Date/Time: 03/25/2021/2:24:21 PM    Final     Assessment: 75 y.o. 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  Associated transaminitis. She is currently being treated with IV Zosyn and general surgery and GI are following. Due to being on Plavix and cardiac history, procedures were delayed and cardiology was consulted.   Clinically, patient seems improved today.  She denies abdominal pain, nausea, or vomiting at the time of consultation today. No confusion or jaundice. LFTs improved today with AST 177, ALT 177, previously AST 517, ALT 259 yesterday.  Total bilirubin improved to 1.4 from 1.6 yesterday.  Cardiology saw patient yesterday.  She had repeat echocardiogram which noted improved EF to 50-55% with grade 1 diastolic dysfunction, mild to moderate AS.  She was cleared to proceed with procedures.  Recommended patient continue on at least 1 antiplatelet medication either ASA or clopidogrel during periprocedural phase and resume dual antiplatelet therapy thereafter once deemed safe.  Also recommended telemetry monitoring during  procedure/surgery and using AV nodal blockers if she develops rapid ventricular rate.  I discussed the case with Dr. Laural Golden.  Stated we may be able to proceed with ERCP on Thursday as her last dose of Plavix was the morning of 4/24.   Plan: 1.  Continue to hold Plavix.   2.  Clear liquids as tolerated. 3.  Continue empiric antibiotics. 4.  Monitor HFP daily. 5.  Likely ERCP on Thursday with Dr. Laural Golden.    LOS: 1 day    03/26/2021, 9:48 AM   Aliene Altes, PA-C [facility]

## 2021-03-26 NOTE — Plan of Care (Signed)

## 2021-03-26 NOTE — Progress Notes (Addendum)
[facility] Progress Note  * Surgery Date in Future *  Subjective: Pain improved. Bilirubin is coming down and AST/ALT. She is tolerating some clears. Plavix being held. Cardiology says appropriate risk and to continue ASA. No further work up. ECHO is improved from prior with EF 50%.   Objective: Vital signs in last 24 hours: Temp:  [97.6 F (36.4 C)-98.5 F (36.9 C)] 98.5 F (36.9 C) (04/26 1347) Pulse Rate:  [69-80] 71 (04/26 1347) Resp:  [16-18] 17 (04/26 1347) BP: (100-124)/(46-59) 121/59 (04/26 1347) SpO2:  [81 %-99 %] 94 % (04/26 1347)    Intake/Output from previous day: 04/25 0701 - 04/26 0700 In: 116.5 [IV Piggyback:116.5] Out: 960 [Urine:960] Intake/Output this shift: Total I/O In: 240 [P.O.:240] Out: -   General appearance: alert, cooperative and no distress Resp: normal work of breathing GI: soft, nondistended, mildly tender RUQ  Lab Results:  Recent Labs    03/25/21 0455 03/26/21 0700  WBC 4.7 4.0  HGB 10.8* 10.6*  HCT 33.2* 33.1*  PLT 202 182   BMET Recent Labs    03/25/21 0455 03/26/21 0700  NA 142 144  K 3.6 3.6  CL 101 104  CO2 32 33*  GLUCOSE 134* 90  BUN 22 18  CREATININE 0.76 0.85  CALCIUM 9.4 9.4   PT/INR Recent Labs    03/25/21 0455  LABPROT 13.4  INR 1.0    Studies/Results: CT ABDOMEN PELVIS W CONTRAST  Result Date: 03/25/2021 CLINICAL DATA:  Acute abdominal pain. History of MI, hypertension, congestive heart failure, breast cancer. EXAM: CT ABDOMEN AND PELVIS WITH CONTRAST TECHNIQUE: Multidetector CT imaging of the abdomen and pelvis was performed using the standard protocol following bolus administration of intravenous contrast. CONTRAST:  184mL OMNIPAQUE IOHEXOL 300 MG/ML  SOLN COMPARISON:  02/19/2021 FINDINGS: Lower chest: Lung bases are clear. Hepatobiliary: Gallbladder wall thickening and edema with infiltration into the pericholecystic fat. Small stones layering in the gallbladder. Extrahepatic bile duct  dilatation with suggestion of stones in the distal common bile duct. Moderate intrahepatic bile duct dilatation. No focal liver lesions. Small amount of perihepatic ascites. Pancreas: Unremarkable. No pancreatic ductal dilatation or surrounding inflammatory changes. Spleen: Normal in size without focal abnormality. Adrenals/Urinary Tract: No adrenal gland nodules. Complex hypoenhancing lesion demonstrated in the left kidney measuring 1.9 cm diameter. This may represent a renal cell carcinoma. Similar appearance to previous study. No hydronephrosis or hydroureter. Bladder wall is thickened, probably due to under distention although cystitis could also have this appearance. Stomach/Bowel: Stomach, small bowel, and colon are not abnormally distended. Scattered colonic diverticula without evidence of diverticulitis. Infiltration and wall thickening around the proximal duodenum probably is referred inflammation from the gallbladder. Duodenitis could also be present. Appendix is not identified. Vascular/Lymphatic: Aortic atherosclerosis. No enlarged abdominal or pelvic lymph nodes. Reproductive: Uterus is surgically absent. No abnormal adnexal masses. Other: No free air in the abdomen. Moderate-sized periumbilical hernia containing fat. Musculoskeletal: Degenerative changes in the spine. Lumbar scoliosis convex towards the left. Sclerotic bone lesions consistent with metastatic disease and vertebral compression of L2. IMPRESSION: 1. Cholelithiasis with changes of acute cholecystitis. Bile duct dilatation with probable stones in the distal common bile duct. 2. Infiltration and wall thickening around the proximal duodenum probably is referred inflammation from the gallbladder. Duodenitis could also have this appearance. 3. Complex hypoenhancing lesion in the left kidney may represent a renal cell carcinoma. No change since prior study. 4. Sclerotic bone lesions consistent with metastatic disease. Compression of L2. 5.  Moderate-sized periumbilical  hernia containing fat. 6. Aortic atherosclerosis. Aortic Atherosclerosis (ICD10-I70.0). Electronically Signed   By: Lucienne Capers M.D.   On: 03/25/2021 00:50   ECHOCARDIOGRAM COMPLETE  Result Date: 03/25/2021    ECHOCARDIOGRAM REPORT   Patient Name:   VALLI RANDOL Date of Exam: 03/25/2021 Medical Rec #:  [id number]         Height:       60.0 in Accession #:    [id number]        Weight:       200.2 lb Date of Birth:  04/27/1946          BSA:          1.867 m Patient Age:    75 years          BP:           141/72 mmHg Patient Gender: F                 HR:           71 bpm. Exam Location:  [facility] Procedure: 2D Echo, Cardiac Doppler and Color Doppler Indications:    CHF  History:        Patient has prior history of Echocardiogram examinations, most                 recent 04/18/2020. CHF, CAD, COPD, Aortic Valve Disease and                 Aortic stenosis; Risk Factors:Hypertension, Dyslipidemia and                 morbid obestiy. Metastatic breast cancer.  Sonographer:    Dustin Flock RDCS Referring Phys: [phone] DAVID TAT IMPRESSIONS  1. Left ventricular ejection fraction, by estimation, is 50 to 55%. The left ventricle has low normal function. The left ventricle has no regional wall motion abnormalities. Left ventricular diastolic parameters are consistent with Grade I diastolic dysfunction (impaired relaxation).  2. Right ventricular systolic function is normal. The right ventricular size is normal. There is mildly elevated pulmonary artery systolic pressure. The estimated right ventricular systolic pressure is 59.9 mmHg.  3. Left atrial size was upper normal.  4. The mitral valve is grossly normal. Trivial mitral valve regurgitation.  5. The aortic valve is tricuspid with some fusion of left and noncoronary cusps. There is moderate calcification of the aortic valve. Aortic valve regurgitation is not visualized. Moderate aortic valve stenosis. Aortic valve mean gradient  measures 10.5 mmHg. Dimentionless index 0.31.  6. The inferior vena cava is normal in size with greater than 50% respiratory variability, suggesting right atrial pressure of 3 mmHg. FINDINGS  Left Ventricle: Left ventricular ejection fraction, by estimation, is 50 to 55%. The left ventricle has low normal function. The left ventricle has no regional wall motion abnormalities. The left ventricular internal cavity size was normal in size. There is borderline left ventricular hypertrophy. Abnormal (paradoxical) septal motion, consistent with left bundle branch block. Left ventricular diastolic parameters are consistent with Grade I diastolic dysfunction (impaired relaxation). Right Ventricle: The right ventricular size is normal. No increase in right ventricular wall thickness. Right ventricular systolic function is normal. There is mildly elevated pulmonary artery systolic pressure. The tricuspid regurgitant velocity is 3.24  m/s, and with an assumed right atrial pressure of 3 mmHg, the estimated right ventricular systolic pressure is 35.7 mmHg. Left Atrium: Left atrial size was upper normal. Right Atrium: Right atrial size was normal in size. Pericardium: There  is no evidence of pericardial effusion. Mitral Valve: The mitral valve is grossly normal. Mild mitral annular calcification. Trivial mitral valve regurgitation. Tricuspid Valve: The tricuspid valve is grossly normal. Tricuspid valve regurgitation is mild. Aortic Valve: The aortic valve is tricuspid. There is moderate calcification of the aortic valve. There is moderate aortic valve annular calcification. Aortic valve regurgitation is not visualized. Moderate aortic stenosis is present. Aortic valve mean gradient measures 10.5 mmHg. Aortic valve peak gradient measures 18.1 mmHg. Aortic valve area, by VTI measures 1.09 cm. Pulmonic Valve: The pulmonic valve was grossly normal. Pulmonic valve regurgitation is trivial. Aorta: The aortic root is normal in size and  structure. Venous: The inferior vena cava is normal in size with greater than 50% respiratory variability, suggesting right atrial pressure of 3 mmHg. IAS/Shunts: No atrial level shunt detected by color flow Doppler.  LEFT VENTRICLE PLAX 2D LVIDd:         4.35 cm  Diastology LVIDs:         3.18 cm  LV e' medial:    5.44 cm/s LV PW:         1.05 cm  LV E/e' medial:  11.7 LV IVS:        1.08 cm  LV e' lateral:   7.40 cm/s LVOT diam:     2.10 cm  LV E/e' lateral: 8.6 LV SV:         52 LV SV Index:   28 LVOT Area:     3.46 cm  RIGHT VENTRICLE RV Basal diam:  3.00 cm RV S prime:     7.07 cm/s TAPSE (M-mode): 2.1 cm LEFT ATRIUM             Index       RIGHT ATRIUM           Index LA diam:        3.80 cm 2.04 cm/m  RA Area:     11.70 cm LA Vol (A2C):   37.5 ml 20.08 ml/m RA Volume:   32.00 ml  17.14 ml/m LA Vol (A4C):   60.0 ml 32.13 ml/m LA Biplane Vol: 50.8 ml 27.20 ml/m  AORTIC VALVE AV Area (Vmax):    1.10 cm AV Area (Vmean):   1.16 cm AV Area (VTI):     1.09 cm AV Vmax:           213.00 cm/s AV Vmean:          149.000 cm/s AV VTI:            0.477 m AV Peak Grad:      18.1 mmHg AV Mean Grad:      10.5 mmHg LVOT Vmax:         67.90 cm/s LVOT Vmean:        50.000 cm/s LVOT VTI:          0.150 m LVOT/AV VTI ratio: 0.31  AORTA Ao Root diam: 2.70 cm MITRAL VALVE               TRICUSPID VALVE MV Area (PHT): 2.68 cm    TR Peak grad:   42.0 mmHg MV Decel Time: 283 msec    TR Vmax:        324.00 cm/s MV E velocity: 63.80 cm/s MV A velocity: 89.50 cm/s  SHUNTS MV E/A ratio:  0.71        Systemic VTI:  0.15 m  Systemic Diam: 2.10 cm Rozann Lesches MD Electronically signed by Rozann Lesches MD Signature Date/Time: 03/25/2021/2:24:21 PM    Final     Anti-infectives: Anti-infectives (From admission, onward)   Start     Dose/Rate Route Frequency Ordered Stop   03/25/21 0600  piperacillin-tazobactam (ZOSYN) IVPB 3.375 g  Status:  Discontinued        3.375 g 100 mL/hr over 30 Minutes  Intravenous Every 6 hours 03/25/21 0321 03/25/21 0323   03/25/21 0600  piperacillin-tazobactam (ZOSYN) IVPB 3.375 g        3.375 g 12.5 mL/hr over 240 Minutes Intravenous Every 8 hours 03/25/21 0324     03/25/21 0130  cefTRIAXone (ROCEPHIN) 1 g in sodium chloride 0.9 % 100 mL IVPB        1 g 200 mL/hr over 30 Minutes Intravenous  Once 03/25/21 0118 03/25/21 0200      Assessment/Plan: Ms. Menchaca is a 75 yo with concern for cholecystitis and potentially choledocholithiasis on CT. Labs improving. Pain improving. Cardiology saw and repeat ECHO improved.  Clear diet Continue Zosyn for acute cholecystitis Dr. Laural Golden may still decide to do ERCP later this week, versus Lap cholecystectomy and intraoperative cholangiogram with me Friday  Hold plavix, will be off 5 days on Friday    LOS: 1 day    [name] 03/26/2021

## 2021-03-26 NOTE — Plan of Care (Signed)
  Problem: Acute Rehab PT Goals(only PT should resolve) Goal: Pt Will Go Supine/Side To Sit Outcome: Progressing Flowsheets (Taken 03/26/2021 1608) Pt will go Supine/Side to Sit: with supervision Goal: Pt Will Go Sit To Supine/Side Outcome: Progressing Flowsheets (Taken 03/26/2021 1608) Pt will go Sit to Supine/Side: with min guard assist Goal: Pt Will Transfer Bed To Chair/Chair To Bed Outcome: Progressing Flowsheets (Taken 03/26/2021 1608) Pt will Transfer Bed to Chair/Chair to Bed: with supervision Goal: Pt Will Ambulate Outcome: Progressing Flowsheets (Taken 03/26/2021 1608) Pt will Ambulate:  15 feet  with minimal assist  with +2  with rolling walker Goal: Pt/caregiver will Perform Home Exercise Program Outcome: Progressing Flowsheets (Taken 03/26/2021 1608) Pt/caregiver will Perform Home Exercise Program:  For increased ROM  For increased strengthening  For improved balance  With Supervision, verbal cues required/provided   Talbot Grumbling PT, DPT 03/26/21, 4:09 PM

## 2021-03-26 NOTE — Progress Notes (Addendum)
PROGRESS NOTE  Stacey [name] [id number] DOB: December 27, 1945 DOA: 03/24/2021 PCP: Sela Hilding   Brief History:  75 year old female with a history of coronary artery disease status post DES to the RCA December [id number], systolic & diastolic CHF, COPD, hypertension, metastatic breast cancer, hyperlipidemia, PSVT, chronic pain syndrome status post intrathecal pump placement presenting with abdominal pain that began on 03/24/2021 that was severe rating 10/10 diffuse in nature.  The patient has subjective fevers and chills.  She had nausea without emesis.  She denies any diarrhea.  She denies any headache, cough, hemoptysis, chest pain, worsening shortness of breath, hematochezia, melena, dysuria, hematuria. In the emergency department, the patient was afebrile hemodynamically stable with oxygen saturation 100% on 3 L.  CT of the abdomen and pelvis showed gallbladder wall thickening and edema with infiltration into the pericholecystic fat.  There was extrahepatic ductal dilatation with suggestion of stones in the distal CBD and moderate intrahepatic biliary ductal dilatation.  There was infiltration and thickening around the proximal duodenum probably referred from the gallbladder.  The patient was started on Zosyn.  GI and general surgery were consulted to assist.  Assessment/Plan: Acute cholecystitis -General surgery consult appreciated-->plan lap chole 4/29 if no issues from ERCP 4/28 -Continue IV Zosyn -Judicious IV fluids -Judicious opioids for pain control  Transaminasemia with hyperbilirubinemia -GI consult appreciated -ERCP planned 03/28/21 (allowing plavix washout) -due to cholecystitis/choledocholithiasis -trending down  Chronic systolic and diastolic CHF -Appears clinically euvolemic -04/18/2020 echo EF 35-40%, G1DD, mild to moderate AAS, mild MR -Continue metoprolol succinate -03/25/21 Echo EF 50-55%, G1DD, no WMA, trivial MR  Chronic respiratory failure with  hypoxia -Patient is normally on 1 L nasal cannula at nighttime -Monitor clinically -Incentive spirometry  Coronary artery disease -No chest pain presently -Continue aspirin -Continue metoprolol succinate  Chronic pain syndrome -Patient has intrathecal morphine pump infusing 5.1 mg per 24-hour. -Continue home dose oxycodone and Lyrica  Metastatic breast cancer to the bone -02/19/2021 bone scan--negative for bone metastasis -Outpatient follow-up with Dr. Delton Coombes  COPD -Stable without exacerbation  Chronic L1/L2 compression fracture -Continue home opioid regimen  Essential hypertension -Continue metoprolol succinate   Hyperlipidemia -Holding Lipitor secondary to elevated LFTs initially  Hypokalemia -Magnesium 1.7  Macrocytic anemia -Check B12 and folate -baseline Hgb 11-12  Morbid Obesity -BMI 39.09 -lifestyle modification      Status is: Inpatient  Appropriate for inpatient due to severity of illness  Dispo: The patient is from: Home  Anticipated d/c is to: Home  Patient currently is not medically stable to d/c.              Difficult to place patient No        Family Communication:  no Family at bedside  Consultants:  GI, general surgery  Code Status:  FULL   DVT Prophylaxis:  SCDs   Procedures: As Listed in Progress Note Above  Antibiotics: Zosyn 4/25>>    Subjective: Patient feels better.  abd pain is controlled with IV morphine.  Denies f/c, cp, sob, n/v/d, dysuria, headache.  Objective: Vitals:   03/25/21 1500 03/25/21 2116 03/26/21 0541 03/26/21 1347  BP: (!) 104/46 (!) 100/55 (!) 124/48 (!) 121/59  Pulse: 80 78 69 71  Resp: 17 18 16 17   Temp: 98.5 F (36.9 C) 98 F (36.7 C) 97.6 F (36.4 C) 98.5 F (36.9 C)  TempSrc: Oral Oral  Oral  SpO2: 99% 91% (!) 81% 94%  Weight:  Height:        Intake/Output Summary (Last 24 hours) at 03/26/2021 1447 Last data filed  at 03/26/2021 1300 Gross per 24 hour  Intake 356.46 ml  Output 960 ml  Net -603.54 ml   Weight change:  Exam:   General:  Pt is alert, follows commands appropriately, not in acute distress  HEENT: No icterus, No thrush, No neck mass, [place]/AT  Cardiovascular: RRR, S1/S2, no rubs, no gallops  Respiratory: bibasilar crackles. No wheeze  Abdomen: Soft/+BS, epigastric and RUQ  tender, non distended, no guarding  Extremities: No edema, No lymphangitis, No petechiae, No rashes, no synovitis   Data Reviewed: I have personally reviewed following labs and imaging studies Basic Metabolic Panel: Recent Labs  Lab 03/24/21 2201 03/25/21 0455 03/26/21 0700  NA 139 142 144  K 3.3* 3.6 3.6  CL 99 101 104  CO2 28 32 33*  GLUCOSE 180* 134* 90  BUN 24* 22 18  CREATININE 0.75 0.76 0.85  CALCIUM 10.0 9.4 9.4  MG  --  1.7 1.9  PHOS  --  4.6  --    Liver Function Tests: Recent Labs  Lab 03/24/21 2201 03/25/21 0455 03/26/21 0700  AST 123* 517* 177*  ALT 52* 259* 177*  ALKPHOS 92 99 89  BILITOT 2.5* 1.6* 1.4*  PROT 6.6 5.4* 5.4*  ALBUMIN 3.6 2.9* 2.8*   Recent Labs  Lab 03/24/21 2201  LIPASE 20   No results for input(s): AMMONIA in the last 168 hours. Coagulation Profile: Recent Labs  Lab 03/25/21 0455  INR 1.0   CBC: Recent Labs  Lab 03/24/21 2201 03/25/21 0455 03/26/21 0700  WBC 11.3* 4.7 4.0  NEUTROABS 8.9*  --   --   HGB 12.6 10.8* 10.6*  HCT 37.0 33.2* 33.1*  MCV 95.9 100.6* 101.8*  PLT 261 202 182   Cardiac Enzymes: No results for input(s): CKTOTAL, CKMB, CKMBINDEX, TROPONINI in the last 168 hours. BNP: Invalid input(s): POCBNP CBG: No results for input(s): GLUCAP in the last 168 hours. HbA1C: Recent Labs    03/25/21 0455  HGBA1C 5.5   Urine analysis:    Component Value Date/Time   COLORURINE AMBER (A) 03/24/2021 2201   APPEARANCEUR CLOUDY (A) 03/24/2021 2201   LABSPEC 1.038 (H) 03/24/2021 2201   PHURINE 5.0 03/24/2021 2201   GLUCOSEU 50 (A)  03/24/2021 2201   HGBUR NEGATIVE 03/24/2021 2201   BILIRUBINUR NEGATIVE 03/24/2021 2201   KETONESUR 5 (A) 03/24/2021 2201   PROTEINUR 100 (A) 03/24/2021 2201   NITRITE NEGATIVE 03/24/2021 2201   LEUKOCYTESUR NEGATIVE 03/24/2021 2201   Sepsis Labs: @LABRCNTIP (procalcitonin:4,lacticidven:4) ) Recent Results (from the past 240 hour(s))  Resp Panel by RT-PCR (Flu A&B, Covid) Nasopharyngeal Swab     Status: None   Collection Time: 03/25/21  1:48 AM   Specimen: Nasopharyngeal Swab; Nasopharyngeal(NP) swabs in vial transport medium  Result Value Ref Range Status   SARS Coronavirus 2 by RT PCR NEGATIVE NEGATIVE Final    Comment: (NOTE) SARS-CoV-2 target nucleic acids are NOT DETECTED.  The SARS-CoV-2 RNA is generally detectable in upper respiratory specimens during the acute phase of infection. The lowest concentration of SARS-CoV-2 viral copies this assay can detect is 138 copies/mL. A negative result does not preclude SARS-Cov-2 infection and should not be used as the sole basis for treatment or other patient management decisions. A negative result may occur with  improper specimen collection/handling, submission of specimen other than nasopharyngeal swab, presence of viral mutation(s) within the areas targeted by this  assay, and inadequate number of viral copies(<138 copies/mL). A negative result must be combined with clinical observations, patient history, and epidemiological information. The expected result is Negative.  Fact Sheet for Patients:  EntrepreneurPulse.com.au  Fact Sheet for Healthcare Providers:  IncredibleEmployment.be  This test is no t yet approved or cleared by the [place] FDA and  has been authorized for detection and/or diagnosis of SARS-CoV-2 by FDA under an Emergency Use Authorization (EUA). This EUA will remain  in effect (meaning this test can be used) for the duration of the COVID-19 declaration under Section  564(b)(1) of the Act, 21 U.S.C.section 360bbb-3(b)(1), unless the authorization is terminated  or revoked sooner.       Influenza A by PCR NEGATIVE NEGATIVE Final   Influenza B by PCR NEGATIVE NEGATIVE Final    Comment: (NOTE) The Xpert Xpress SARS-CoV-2/FLU/RSV plus assay is intended as an aid in the diagnosis of influenza from Nasopharyngeal swab specimens and should not be used as a sole basis for treatment. Nasal washings and aspirates are unacceptable for Xpert Xpress SARS-CoV-2/FLU/RSV testing.  Fact Sheet for Patients: EntrepreneurPulse.com.au  Fact Sheet for Healthcare Providers: IncredibleEmployment.be  This test is not yet approved or cleared by the [place] FDA and has been authorized for detection and/or diagnosis of SARS-CoV-2 by FDA under an Emergency Use Authorization (EUA). This EUA will remain in effect (meaning this test can be used) for the duration of the COVID-19 declaration under Section 564(b)(1) of the Act, 21 U.S.C. section 360bbb-3(b)(1), unless the authorization is terminated or revoked.  Performed at [facility], [address] [place], [place] [address]      Scheduled Meds: . aspirin EC  81 mg Oral Daily  . atorvastatin  80 mg Oral Q supper  . metoprolol succinate  150 mg Oral Daily  . pregabalin  50 mg Oral TID   Continuous Infusions: . piperacillin-tazobactam (ZOSYN)  IV 3.375 g (03/26/21 1424)    Procedures/Studies: CT ABDOMEN PELVIS W CONTRAST  Result Date: 03/25/2021 CLINICAL DATA:  Acute abdominal pain. History of MI, hypertension, congestive heart failure, breast cancer. EXAM: CT ABDOMEN AND PELVIS WITH CONTRAST TECHNIQUE: Multidetector CT imaging of the abdomen and pelvis was performed using the standard protocol following bolus administration of intravenous contrast. CONTRAST:  170mL OMNIPAQUE IOHEXOL 300 MG/ML  SOLN COMPARISON:  02/19/2021 FINDINGS: Lower chest: Lung bases are clear.  Hepatobiliary: Gallbladder wall thickening and edema with infiltration into the pericholecystic fat. Small stones layering in the gallbladder. Extrahepatic bile duct dilatation with suggestion of stones in the distal common bile duct. Moderate intrahepatic bile duct dilatation. No focal liver lesions. Small amount of perihepatic ascites. Pancreas: Unremarkable. No pancreatic ductal dilatation or surrounding inflammatory changes. Spleen: Normal in size without focal abnormality. Adrenals/Urinary Tract: No adrenal gland nodules. Complex hypoenhancing lesion demonstrated in the left kidney measuring 1.9 cm diameter. This may represent a renal cell carcinoma. Similar appearance to previous study. No hydronephrosis or hydroureter. Bladder wall is thickened, probably due to under distention although cystitis could also have this appearance. Stomach/Bowel: Stomach, small bowel, and colon are not abnormally distended. Scattered colonic diverticula without evidence of diverticulitis. Infiltration and wall thickening around the proximal duodenum probably is referred inflammation from the gallbladder. Duodenitis could also be present. Appendix is not identified. Vascular/Lymphatic: Aortic atherosclerosis. No enlarged abdominal or pelvic lymph nodes. Reproductive: Uterus is surgically absent. No abnormal adnexal masses. Other: No free air in the abdomen. Moderate-sized periumbilical hernia containing fat. Musculoskeletal: Degenerative changes in the spine. Lumbar scoliosis  convex towards the left. Sclerotic bone lesions consistent with metastatic disease and vertebral compression of L2. IMPRESSION: 1. Cholelithiasis with changes of acute cholecystitis. Bile duct dilatation with probable stones in the distal common bile duct. 2. Infiltration and wall thickening around the proximal duodenum probably is referred inflammation from the gallbladder. Duodenitis could also have this appearance. 3. Complex hypoenhancing lesion in the  left kidney may represent a renal cell carcinoma. No change since prior study. 4. Sclerotic bone lesions consistent with metastatic disease. Compression of L2. 5. Moderate-sized periumbilical hernia containing fat. 6. Aortic atherosclerosis. Aortic Atherosclerosis (ICD10-I70.0). Electronically Signed   By: Lucienne Capers M.D.   On: 03/25/2021 00:50   ECHOCARDIOGRAM COMPLETE  Result Date: 03/25/2021    ECHOCARDIOGRAM REPORT   Patient Name:   NOTNAMED CROUCHER Date of Exam: 03/25/2021 Medical Rec #:  [id number]         Height:       60.0 in Accession #:    [id number]        Weight:       200.2 lb Date of Birth:  01/28/1946          BSA:          1.867 m Patient Age:    80 years          BP:           141/72 mmHg Patient Gender: F                 HR:           71 bpm. Exam Location:  [facility] Procedure: 2D Echo, Cardiac Doppler and Color Doppler Indications:    CHF  History:        Patient has prior history of Echocardiogram examinations, most                 recent 04/18/2020. CHF, CAD, COPD, Aortic Valve Disease and                 Aortic stenosis; Risk Factors:Hypertension, Dyslipidemia and                 morbid obestiy. Metastatic breast cancer.  Sonographer:    Dustin Flock RDCS Referring Phys: [phone] Annaliah Rivenbark IMPRESSIONS  1. Left ventricular ejection fraction, by estimation, is 50 to 55%. The left ventricle has low normal function. The left ventricle has no regional wall motion abnormalities. Left ventricular diastolic parameters are consistent with Grade I diastolic dysfunction (impaired relaxation).  2. Right ventricular systolic function is normal. The right ventricular size is normal. There is mildly elevated pulmonary artery systolic pressure. The estimated right ventricular systolic pressure is 01.0 mmHg.  3. Left atrial size was upper normal.  4. The mitral valve is grossly normal. Trivial mitral valve regurgitation.  5. The aortic valve is tricuspid with some fusion of left and noncoronary cusps.  There is moderate calcification of the aortic valve. Aortic valve regurgitation is not visualized. Moderate aortic valve stenosis. Aortic valve mean gradient measures 10.5 mmHg. Dimentionless index 0.31.  6. The inferior vena cava is normal in size with greater than 50% respiratory variability, suggesting right atrial pressure of 3 mmHg. FINDINGS  Left Ventricle: Left ventricular ejection fraction, by estimation, is 50 to 55%. The left ventricle has low normal function. The left ventricle has no regional wall motion abnormalities. The left ventricular internal cavity size was normal in size. There is borderline left ventricular hypertrophy. Abnormal (paradoxical) septal motion, consistent with  left bundle branch block. Left ventricular diastolic parameters are consistent with Grade I diastolic dysfunction (impaired relaxation). Right Ventricle: The right ventricular size is normal. No increase in right ventricular wall thickness. Right ventricular systolic function is normal. There is mildly elevated pulmonary artery systolic pressure. The tricuspid regurgitant velocity is 3.24  m/s, and with an assumed right atrial pressure of 3 mmHg, the estimated right ventricular systolic pressure is 0000000 mmHg. Left Atrium: Left atrial size was upper normal. Right Atrium: Right atrial size was normal in size. Pericardium: There is no evidence of pericardial effusion. Mitral Valve: The mitral valve is grossly normal. Mild mitral annular calcification. Trivial mitral valve regurgitation. Tricuspid Valve: The tricuspid valve is grossly normal. Tricuspid valve regurgitation is mild. Aortic Valve: The aortic valve is tricuspid. There is moderate calcification of the aortic valve. There is moderate aortic valve annular calcification. Aortic valve regurgitation is not visualized. Moderate aortic stenosis is present. Aortic valve mean gradient measures 10.5 mmHg. Aortic valve peak gradient measures 18.1 mmHg. Aortic valve area, by VTI  measures 1.09 cm. Pulmonic Valve: The pulmonic valve was grossly normal. Pulmonic valve regurgitation is trivial. Aorta: The aortic root is normal in size and structure. Venous: The inferior vena cava is normal in size with greater than 50% respiratory variability, suggesting right atrial pressure of 3 mmHg. IAS/Shunts: No atrial level shunt detected by color flow Doppler.  LEFT VENTRICLE PLAX 2D LVIDd:         4.35 cm  Diastology LVIDs:         3.18 cm  LV e' medial:    5.44 cm/s LV PW:         1.05 cm  LV E/e' medial:  11.7 LV IVS:        1.08 cm  LV e' lateral:   7.40 cm/s LVOT diam:     2.10 cm  LV E/e' lateral: 8.6 LV SV:         52 LV SV Index:   28 LVOT Area:     3.46 cm  RIGHT VENTRICLE RV Basal diam:  3.00 cm RV S prime:     7.07 cm/s TAPSE (M-mode): 2.1 cm LEFT ATRIUM             Index       RIGHT ATRIUM           Index LA diam:        3.80 cm 2.04 cm/m  RA Area:     11.70 cm LA Vol (A2C):   37.5 ml 20.08 ml/m RA Volume:   32.00 ml  17.14 ml/m LA Vol (A4C):   60.0 ml 32.13 ml/m LA Biplane Vol: 50.8 ml 27.20 ml/m  AORTIC VALVE AV Area (Vmax):    1.10 cm AV Area (Vmean):   1.16 cm AV Area (VTI):     1.09 cm AV Vmax:           213.00 cm/s AV Vmean:          149.000 cm/s AV VTI:            0.477 m AV Peak Grad:      18.1 mmHg AV Mean Grad:      10.5 mmHg LVOT Vmax:         67.90 cm/s LVOT Vmean:        50.000 cm/s LVOT VTI:          0.150 m LVOT/AV VTI ratio: 0.31  AORTA Ao Root diam: 2.70 cm MITRAL  VALVE               TRICUSPID VALVE MV Area (PHT): 2.68 cm    TR Peak grad:   42.0 mmHg MV Decel Time: 283 msec    TR Vmax:        324.00 cm/s MV E velocity: 63.80 cm/s MV A velocity: 89.50 cm/s  SHUNTS MV E/A ratio:  0.71        Systemic VTI:  0.15 m                            Systemic Diam: 2.10 cm Rozann Lesches MD Electronically signed by Rozann Lesches MD Signature Date/Time: 03/25/2021/2:24:21 PM    Final     Orson Eva, DO  [facility]  If 7PM-7AM, please contact  night-coverage [email or web address] Password [facility] 03/26/2021, 2:47 PM   LOS: 1 day

## 2021-03-27 ENCOUNTER — Encounter (HOSPITAL_COMMUNITY): Payer: Self-pay | Admitting: Internal Medicine

## 2021-03-27 DIAGNOSIS — I5042 Chronic combined systolic (congestive) and diastolic (congestive) heart failure: Secondary | ICD-10-CM

## 2021-03-27 DIAGNOSIS — R109 Unspecified abdominal pain: Secondary | ICD-10-CM | POA: Diagnosis not present

## 2021-03-27 DIAGNOSIS — E669 Obesity, unspecified: Secondary | ICD-10-CM

## 2021-03-27 DIAGNOSIS — I10 Essential (primary) hypertension: Secondary | ICD-10-CM

## 2021-03-27 DIAGNOSIS — K819 Cholecystitis, unspecified: Secondary | ICD-10-CM | POA: Diagnosis not present

## 2021-03-27 DIAGNOSIS — R748 Abnormal levels of other serum enzymes: Secondary | ICD-10-CM | POA: Diagnosis not present

## 2021-03-27 DIAGNOSIS — R17 Unspecified jaundice: Secondary | ICD-10-CM

## 2021-03-27 DIAGNOSIS — K8001 Calculus of gallbladder with acute cholecystitis with obstruction: Secondary | ICD-10-CM | POA: Diagnosis not present

## 2021-03-27 DIAGNOSIS — R1084 Generalized abdominal pain: Secondary | ICD-10-CM

## 2021-03-27 LAB — COMPREHENSIVE METABOLIC PANEL
ALT: 117 U/L — ABNORMAL HIGH (ref 0–44)
AST: 87 U/L — ABNORMAL HIGH (ref 15–41)
Albumin: 3 g/dL — ABNORMAL LOW (ref 3.5–5.0)
Alkaline Phosphatase: 78 U/L (ref 38–126)
Anion gap: 10 (ref 5–15)
BUN: 13 mg/dL (ref 8–23)
CO2: 30 mmol/L (ref 22–32)
Calcium: 9.3 mg/dL (ref 8.9–10.3)
Chloride: 101 mmol/L (ref 98–111)
Creatinine, Ser: 0.76 mg/dL (ref 0.44–1.00)
GFR, Estimated: >60 mL/min (ref >60)
Glucose, Bld: 95 mg/dL (ref 70–99)
Potassium: 3.5 mmol/L (ref 3.5–5.1)
Sodium: 141 mmol/L (ref 135–145)
Total Bilirubin: 1.4 mg/dL — ABNORMAL HIGH (ref 0.3–1.2)
Total Protein: 5.6 g/dL — ABNORMAL LOW (ref 6.5–8.1)

## 2021-03-27 LAB — MAGNESIUM: Magnesium: 1.7 mg/dL (ref 1.7–2.4)

## 2021-03-27 LAB — VITAMIN B12: Vitamin B-12: 999 pg/mL — ABNORMAL HIGH (ref 180–914)

## 2021-03-27 LAB — FOLATE: Folate: 52.2 ng/mL (ref >5.9)

## 2021-03-27 MED ORDER — SODIUM CHLORIDE 0.9 % IV SOLN: INTRAVENOUS | Status: DC

## 2021-03-27 NOTE — Progress Notes (Signed)
[facility] Progress Note  * Surgery Date in Future *  Subjective: Feeling a little better but sore. Tolerating clears.   Objective: Vital signs in last 24 hours: Temp:  [97.7 F (36.5 C)-98.5 F (36.9 C)] 97.7 F (36.5 C) (04/27 0431) Pulse Rate:  [70-79] 79 (04/27 0905) Resp:  [17-20] 19 (04/27 0431) BP: (121-154)/(47-81) 154/81 (04/27 0905) SpO2:  [85 %-94 %] 85 % (04/27 0431) Last BM Date: 03/24/21  Intake/Output from previous day: 04/26 0701 - 04/27 0700 In: 480 [P.O.:480] Out: 1850 [Urine:1850] Intake/Output this shift: No intake/output data recorded.  General appearance: alert, cooperative and mild distress GI: soft, tender RUQ, nondistended  Lab Results:  Recent Labs    03/25/21 0455 03/26/21 0700  WBC 4.7 4.0  HGB 10.8* 10.6*  HCT 33.2* 33.1*  PLT 202 182   BMET Recent Labs    03/26/21 0700 03/27/21 0503  NA 144 141  K 3.6 3.5  CL 104 101  CO2 33* 30  GLUCOSE 90 95  BUN 18 13  CREATININE 0.85 0.76  CALCIUM 9.4 9.3   PT/INR Recent Labs    03/25/21 0455  LABPROT 13.4  INR 1.0    Studies/Results: No results found.  Anti-infectives: Anti-infectives (From admission, onward)   Start     Dose/Rate Route Frequency Ordered Stop   03/25/21 0600  piperacillin-tazobactam (ZOSYN) IVPB 3.375 g  Status:  Discontinued        3.375 g 100 mL/hr over 30 Minutes Intravenous Every 6 hours 03/25/21 0321 03/25/21 0323   03/25/21 0600  piperacillin-tazobactam (ZOSYN) IVPB 3.375 g        3.375 g 12.5 mL/hr over 240 Minutes Intravenous Every 8 hours 03/25/21 0324     03/25/21 0130  cefTRIAXone (ROCEPHIN) 1 g in sodium chloride 0.9 % 100 mL IVPB        1 g 200 mL/hr over 30 Minutes Intravenous  Once 03/25/21 0118 03/25/21 0200      Assessment/Plan: [name] is a 75 yo with cholecystitis and possible choledocholithiasis on CT scan. Overall improving.  ERCP tomorrow and then proceed with Lap chole on Friday.   Stacey Labrum,  MD [facility] [place], [place] [address] [phone] (office)    LOS: 2 days    [name] 03/27/2021

## 2021-03-27 NOTE — Progress Notes (Signed)
Subjective: Having significant back pain. Abdomen only "sore" this morning. Denies N/V. Has had 2 loose stools today, but has not looked at stools for blood. States "I've only been having liquids so I'm not surprised I'm having loose stools." No other GI complaints.  Objective: Vital signs in last 24 hours: Temp:  [97.7 F (36.5 C)-98.5 F (36.9 C)] 97.7 F (36.5 C) (04/27 0431) Pulse Rate:  [70-72] 72 (04/27 0431) Resp:  [17-20] 19 (04/27 0431) BP: (121-145)/(47-59) 132/47 (04/27 0431) SpO2:  [85 %-94 %] 85 % (04/27 0431) Last BM Date: 03/24/21 General:   Alert and oriented, pleasant. Appears in pain. Head:  Normocephalic and atraumatic. Eyes:  No icterus, sclera clear. Conjuctiva pink.  Heart:  S1, S2 present, no murmurs noted.  Lungs: Clear to auscultation bilaterally, without wheezing, rales, or rhonchi.  Abdomen:  Bowel sounds present, soft, non-distended. Mild RUQ soreness with palpation. No HSM or hernias noted. No rebound or guarding. No masses appreciated  Msk:  Symmetrical without gross deformities. Pulses:  Normal pulses noted. Extremities:  Without clubbing or edema. Neurologic:  Alert and  oriented x4;  grossly normal neurologically. Skin:  Warm and dry, intact without significant lesions.  Cervical Nodes:  No significant cervical adenopathy. Psych:  Alert and cooperative. Normal mood and affect.  Intake/Output from previous day: 04/26 0701 - 04/27 0700 In: 480 [P.O.:480] Out: 1850 [Urine:1850] Intake/Output this shift: No intake/output data recorded.  Lab Results: Recent Labs    03/24/21 2201 03/25/21 0455 03/26/21 0700  WBC 11.3* 4.7 4.0  HGB 12.6 10.8* 10.6*  HCT 37.0 33.2* 33.1*  PLT 261 202 182   BMET Recent Labs    03/25/21 0455 03/26/21 0700 03/27/21 0503  NA 142 144 141  K 3.6 3.6 3.5  CL 101 104 101  CO2 32 33* 30  GLUCOSE 134* 90 95  BUN 22 18 13   CREATININE 0.76 0.85 0.76  CALCIUM 9.4 9.4 9.3   LFT Recent Labs     03/25/21 0455 03/26/21 0700 03/27/21 0503  PROT 5.4* 5.4* 5.6*  ALBUMIN 2.9* 2.8* 3.0*  AST 517* 177* 87*  ALT 259* 177* 117*  ALKPHOS 99 89 78  BILITOT 1.6* 1.4* 1.4*   PT/INR Recent Labs    03/25/21 0455  LABPROT 13.4  INR 1.0   Hepatitis Panel No results for input(s): HEPBSAG, HCVAB, HEPAIGM, HEPBIGM in the last 72 hours.   Studies/Results: ECHOCARDIOGRAM COMPLETE  Result Date: 03/25/2021    ECHOCARDIOGRAM REPORT   Patient Name:   Stacey [name] Date of Exam: 03/25/2021 Medical Rec #:  [id number]         Height:       60.0 in Accession #:    [id number]        Weight:       200.2 lb Date of Birth:  [birth year]          BSA:          1.867 m Patient Age:    75 years          BP:           141/72 mmHg Patient Gender: F                 HR:           71 bpm. Exam Location:  [facility] Procedure: 2D Echo, Cardiac Doppler and Color Doppler Indications:    CHF  History:        Patient has prior  history of Echocardiogram examinations, most                 recent 04/18/2020. CHF, CAD, COPD, Aortic Valve Disease and                 Aortic stenosis; Risk Factors:Hypertension, Dyslipidemia and                 morbid obestiy. Metastatic breast cancer.  Sonographer:    Dustin Flock RDCS Referring Phys: [phone] DAVID TAT IMPRESSIONS  1. Left ventricular ejection fraction, by estimation, is 50 to 55%. The left ventricle has low normal function. The left ventricle has no regional wall motion abnormalities. Left ventricular diastolic parameters are consistent with Grade I diastolic dysfunction (impaired relaxation).  2. Right ventricular systolic function is normal. The right ventricular size is normal. There is mildly elevated pulmonary artery systolic pressure. The estimated right ventricular systolic pressure is 38.1 mmHg.  3. Left atrial size was upper normal.  4. The mitral valve is grossly normal. Trivial mitral valve regurgitation.  5. The aortic valve is tricuspid with some fusion of left and  noncoronary cusps. There is moderate calcification of the aortic valve. Aortic valve regurgitation is not visualized. Moderate aortic valve stenosis. Aortic valve mean gradient measures 10.5 mmHg. Dimentionless index 0.31.  6. The inferior vena cava is normal in size with greater than 50% respiratory variability, suggesting right atrial pressure of 3 mmHg. FINDINGS  Left Ventricle: Left ventricular ejection fraction, by estimation, is 50 to 55%. The left ventricle has low normal function. The left ventricle has no regional wall motion abnormalities. The left ventricular internal cavity size was normal in size. There is borderline left ventricular hypertrophy. Abnormal (paradoxical) septal motion, consistent with left bundle branch block. Left ventricular diastolic parameters are consistent with Grade I diastolic dysfunction (impaired relaxation). Right Ventricle: The right ventricular size is normal. No increase in right ventricular wall thickness. Right ventricular systolic function is normal. There is mildly elevated pulmonary artery systolic pressure. The tricuspid regurgitant velocity is 3.24  m/s, and with an assumed right atrial pressure of 3 mmHg, the estimated right ventricular systolic pressure is 82.9 mmHg. Left Atrium: Left atrial size was upper normal. Right Atrium: Right atrial size was normal in size. Pericardium: There is no evidence of pericardial effusion. Mitral Valve: The mitral valve is grossly normal. Mild mitral annular calcification. Trivial mitral valve regurgitation. Tricuspid Valve: The tricuspid valve is grossly normal. Tricuspid valve regurgitation is mild. Aortic Valve: The aortic valve is tricuspid. There is moderate calcification of the aortic valve. There is moderate aortic valve annular calcification. Aortic valve regurgitation is not visualized. Moderate aortic stenosis is present. Aortic valve mean gradient measures 10.5 mmHg. Aortic valve peak gradient measures 18.1 mmHg. Aortic  valve area, by VTI measures 1.09 cm. Pulmonic Valve: The pulmonic valve was grossly normal. Pulmonic valve regurgitation is trivial. Aorta: The aortic root is normal in size and structure. Venous: The inferior vena cava is normal in size with greater than 50% respiratory variability, suggesting right atrial pressure of 3 mmHg. IAS/Shunts: No atrial level shunt detected by color flow Doppler.  LEFT VENTRICLE PLAX 2D LVIDd:         4.35 cm  Diastology LVIDs:         3.18 cm  LV e' medial:    5.44 cm/s LV PW:         1.05 cm  LV E/e' medial:  11.7 LV IVS:  1.08 cm  LV e' lateral:   7.40 cm/s LVOT diam:     2.10 cm  LV E/e' lateral: 8.6 LV SV:         52 LV SV Index:   28 LVOT Area:     3.46 cm  RIGHT VENTRICLE RV Basal diam:  3.00 cm RV S prime:     7.07 cm/s TAPSE (M-mode): 2.1 cm LEFT ATRIUM             Index       RIGHT ATRIUM           Index LA diam:        3.80 cm 2.04 cm/m  RA Area:     11.70 cm LA Vol (A2C):   37.5 ml 20.08 ml/m RA Volume:   32.00 ml  17.14 ml/m LA Vol (A4C):   60.0 ml 32.13 ml/m LA Biplane Vol: 50.8 ml 27.20 ml/m  AORTIC VALVE AV Area (Vmax):    1.10 cm AV Area (Vmean):   1.16 cm AV Area (VTI):     1.09 cm AV Vmax:           213.00 cm/s AV Vmean:          149.000 cm/s AV VTI:            0.477 m AV Peak Grad:      18.1 mmHg AV Mean Grad:      10.5 mmHg LVOT Vmax:         67.90 cm/s LVOT Vmean:        50.000 cm/s LVOT VTI:          0.150 m LVOT/AV VTI ratio: 0.31  AORTA Ao Root diam: 2.70 cm MITRAL VALVE               TRICUSPID VALVE MV Area (PHT): 2.68 cm    TR Peak grad:   42.0 mmHg MV Decel Time: 283 msec    TR Vmax:        324.00 cm/s MV E velocity: 63.80 cm/s MV A velocity: 89.50 cm/s  SHUNTS MV E/A ratio:  0.71        Systemic VTI:  0.15 m                            Systemic Diam: 2.10 cm Rozann Lesches MD Electronically signed by Rozann Lesches MD Signature Date/Time: 03/25/2021/2:24:21 PM    Final     Assessment: 75 y.o. 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  Associated transaminitis. She is currently being treated with IV Zosyn and general surgery and GI are following. Due to being on Plavix and cardiac history, procedures were delayed and cardiology was consulted. Cardiology has since cleared the patient for procedures.  Cholecystitis/cholelithiasis with dilated CBD and elevated LFTs - Clinically, patient seems to be improving.  She denies abdominal pain, nausea, or vomiting today. No confusion or jaundice. LFTs continue to improve today with AST/ALT 87/117, total bilirubin stable at 1.4 from peak of 2.5 three days ago.  Cardiology saw patient yesterday.  She had repeat echocardiogram which noted improved EF to 50-55% with grade 1 diastolic dysfunction, mild to moderate  AS.  She was cleared to proceed with procedures.  Recommended patient continue on at least 1 antiplatelet medication either ASA or clopidogrel during periprocedural phase and resume dual antiplatelet therapy thereafter once deemed safe.  Also recommended telemetry monitoring during procedure/surgery and using AV nodal blockers if she develops rapid ventricular rate.  ERCP scheduled for Thursday as her last dose of Plavix was the morning of 4/24. Anticipate cholecystectomy at some point after that per surgical note.   Plan: 1. Continue to trend LFTs 2. Anticipate ERCP tomorrow 3. NPO after midnight 4. Supportive care   Thank you for allowing [place] to participate in the care of [place], DNP, AGNP-C Adult & Gerontological Nurse Practitioner [facility]    LOS: 2 days    03/27/2021, 8:18 AM

## 2021-03-27 NOTE — Progress Notes (Signed)
PROGRESS NOTE  Stacey [name]:[id number] DOB: 1946-09-22 DOA: 03/24/2021 PCP: Sela Hilding   Brief History:  75 year old female with a history of coronary artery disease status post DES to the RCA December 5009, systolic & diastolic CHF, COPD, hypertension, metastatic breast cancer, hyperlipidemia, PSVT, chronic pain syndrome status post intrathecal pump placement presenting with abdominal pain that began on 03/24/2021 that was severe rating 10/10 diffuse in nature.  The patient has subjective fevers and chills.  She had nausea without emesis.  She denies any diarrhea.  She denies any headache, cough, hemoptysis, chest pain, worsening shortness of breath, hematochezia, melena, dysuria, hematuria. In the emergency department, the patient was afebrile hemodynamically stable with oxygen saturation 100% on 3 L.  CT of the abdomen and pelvis showed gallbladder wall thickening and edema with infiltration into the pericholecystic fat.  There was extrahepatic ductal dilatation with suggestion of stones in the distal CBD and moderate intrahepatic biliary ductal dilatation.  There was infiltration and thickening around the proximal duodenum probably referred from the gallbladder.  The patient was started on Zosyn.  GI and general surgery were consulted to assist.  Assessment/Plan: Acute cholecystitis -General surgery consult appreciated-->plan lap chole 4/29 if no issues from ERCP 4/28 -Continue IV Zosyn -Judicious IV fluids -Judicious opioids for pain control  Transaminasemia with hyperbilirubinemia -GI consult appreciated -ERCP planned 03/28/21 (allowing plavix washout) -due to cholecystitis/choledocholithiasis -trending down  Chronic systolic and diastolic CHF -Appears clinically euvolemic -04/18/2020 echo EF 35-40%, G1DD, mild to moderate AAS, mild MR -Continue metoprolol succinate -03/25/21 Echo EF 50-55%, G1DD, no WMA, trivial MR  Chronic respiratory failure with  hypoxia -Patient is normally on 1 L nasal cannula at nighttime -Monitor clinically -Incentive spirometry  Coronary artery disease -No chest pain presently -Continue aspirin -Continue metoprolol succinate  Chronic pain syndrome -Patient has intrathecal morphine pump infusing 5.1 mg per 24-hour. -Continue home dose oxycodone and Lyrica  Metastatic breast cancer to the bone -02/19/2021 bone scan--negative for bone metastasis -Outpatient follow-up with Dr. Delton Coombes  COPD -Stable without exacerbation  Chronic L1/L2 compression fracture -Continue home opioid regimen  Essential hypertension -Continue metoprolol succinate   Hyperlipidemia -Holding Lipitor secondary to elevated LFTs initially  Hypokalemia -Magnesium 1.7  Macrocytic anemia -Check B12 and folate -baseline Hgb 11-12  Morbid Obesity -BMI 39.09 -lifestyle modification  Status is: Inpatient  Appropriate for inpatient due to severity of illness  Dispo: The patient is from: Home  Anticipated d/c is to: Home  Patient currently is not medically stable to d/c.              Difficult to place patient No   Family Communication:  son present at bedside  Consultants:  GI, general surgery  Code Status:  FULL   DVT Prophylaxis:  SCDs   Procedures: As Listed in Progress Note Above  Antibiotics: Zosyn 4/25>>  Subjective: Patient reports pain but otherwise no complaint  Objective: Vitals:   03/26/21 1347 03/26/21 2055 03/27/21 0431 03/27/21 0905  BP: (!) 121/59 (!) 145/54 (!) 132/47 (!) 154/81  Pulse: 71 70 72 79  Resp: 17 20 19    Temp: 98.5 F (36.9 C) 98.3 F (36.8 C) 97.7 F (36.5 C)   TempSrc: Oral Oral    SpO2: 94% 90% (!) 85%   Weight:      Height:        Intake/Output Summary (Last 24 hours) at 03/27/2021 1449 Last data filed at 03/27/2021 0324 Gross per 24  hour  Intake 240 ml  Output 1850 ml  Net -1610 ml   Weight change:   Exam:   General:  Pt is alert, follows commands appropriately, not in acute distress  HEENT: No icterus, No thrush, No neck mass, [place]/AT  Cardiovascular: irregularly irregular S1/S2, no rubs, no gallops  Respiratory: BBS clear No wheeze  Abdomen: persistent epigastric and RUQ  Tenderness with murphy sign, no guarding  Extremities: No edema, No lymphangitis, No petechiae, No rashes, no synovitis  Data Reviewed: I have personally reviewed following labs and imaging studies Basic Metabolic Panel: Recent Labs  Lab 03/24/21 2201 03/25/21 0455 03/26/21 0700 03/27/21 0503  NA 139 142 144 141  K 3.3* 3.6 3.6 3.5  CL 99 101 104 101  CO2 28 32 33* 30  GLUCOSE 180* 134* 90 95  BUN 24* 22 18 13   CREATININE 0.75 0.76 0.85 0.76  CALCIUM 10.0 9.4 9.4 9.3  MG  --  1.7 1.9 1.7  PHOS  --  4.6  --   --    Liver Function Tests: Recent Labs  Lab 03/24/21 2201 03/25/21 0455 03/26/21 0700 03/27/21 0503  AST 123* 517* 177* 87*  ALT 52* 259* 177* 117*  ALKPHOS 92 99 89 78  BILITOT 2.5* 1.6* 1.4* 1.4*  PROT 6.6 5.4* 5.4* 5.6*  ALBUMIN 3.6 2.9* 2.8* 3.0*   Recent Labs  Lab 03/24/21 2201  LIPASE 20   No results for input(s): AMMONIA in the last 168 hours. Coagulation Profile: Recent Labs  Lab 03/25/21 0455  INR 1.0   CBC: Recent Labs  Lab 03/24/21 2201 03/25/21 0455 03/26/21 0700  WBC 11.3* 4.7 4.0  NEUTROABS 8.9*  --   --   HGB 12.6 10.8* 10.6*  HCT 37.0 33.2* 33.1*  MCV 95.9 100.6* 101.8*  PLT 261 202 182   Cardiac Enzymes: No results for input(s): CKTOTAL, CKMB, CKMBINDEX, TROPONINI in the last 168 hours. BNP: Invalid input(s): POCBNP CBG: No results for input(s): GLUCAP in the last 168 hours. HbA1C: Recent Labs    03/25/21 0455  HGBA1C 5.5   Urine analysis:    Component Value Date/Time   COLORURINE AMBER (A) 03/24/2021 2201   APPEARANCEUR CLOUDY (A) 03/24/2021 2201   LABSPEC 1.038 (H) 03/24/2021 2201   PHURINE 5.0 03/24/2021 2201   GLUCOSEU 50 (A)  03/24/2021 2201   HGBUR NEGATIVE 03/24/2021 2201   BILIRUBINUR NEGATIVE 03/24/2021 2201   KETONESUR 5 (A) 03/24/2021 2201   PROTEINUR 100 (A) 03/24/2021 2201   NITRITE NEGATIVE 03/24/2021 2201   LEUKOCYTESUR NEGATIVE 03/24/2021 2201   Sepsis Labs:  Recent Results (from the past 240 hour(s))  Resp Panel by RT-PCR (Flu A&B, Covid) Nasopharyngeal Swab     Status: None   Collection Time: 03/25/21  1:48 AM   Specimen: Nasopharyngeal Swab; Nasopharyngeal(NP) swabs in vial transport medium  Result Value Ref Range Status   SARS Coronavirus 2 by RT PCR NEGATIVE NEGATIVE Final    Comment: (NOTE) SARS-CoV-2 target nucleic acids are NOT DETECTED.  The SARS-CoV-2 RNA is generally detectable in upper respiratory specimens during the acute phase of infection. The lowest concentration of SARS-CoV-2 viral copies this assay can detect is 138 copies/mL. A negative result does not preclude SARS-Cov-2 infection and should not be used as the sole basis for treatment or other patient management decisions. A negative result may occur with  improper specimen collection/handling, submission of specimen other than nasopharyngeal swab, presence of viral mutation(s) within the areas targeted by this assay, and inadequate  number of viral copies(<138 copies/mL). A negative result must be combined with clinical observations, patient history, and epidemiological information. The expected result is Negative.  Fact Sheet for Patients:  EntrepreneurPulse.com.au  Fact Sheet for Healthcare Providers:  IncredibleEmployment.be  This test is no t yet approved or cleared by the [place] FDA and  has been authorized for detection and/or diagnosis of SARS-CoV-2 by FDA under an Emergency Use Authorization (EUA). This EUA will remain  in effect (meaning this test can be used) for the duration of the COVID-19 declaration under Section 564(b)(1) of the Act, 21 U.S.C.section  360bbb-3(b)(1), unless the authorization is terminated  or revoked sooner.       Influenza A by PCR NEGATIVE NEGATIVE Final   Influenza B by PCR NEGATIVE NEGATIVE Final    Comment: (NOTE) The Xpert Xpress SARS-CoV-2/FLU/RSV plus assay is intended as an aid in the diagnosis of influenza from Nasopharyngeal swab specimens and should not be used as a sole basis for treatment. Nasal washings and aspirates are unacceptable for Xpert Xpress SARS-CoV-2/FLU/RSV testing.  Fact Sheet for Patients: EntrepreneurPulse.com.au  Fact Sheet for Healthcare Providers: IncredibleEmployment.be  This test is not yet approved or cleared by the [place] FDA and has been authorized for detection and/or diagnosis of SARS-CoV-2 by FDA under an Emergency Use Authorization (EUA). This EUA will remain in effect (meaning this test can be used) for the duration of the COVID-19 declaration under Section 564(b)(1) of the Act, 21 U.S.C. section 360bbb-3(b)(1), unless the authorization is terminated or revoked.  Performed at [facility], [address] [place], [place] [address]      Scheduled Meds: . vitamin C  500 mg Oral Daily  . aspirin EC  81 mg Oral Daily  . atorvastatin  80 mg Oral Q supper  . metoprolol succinate  150 mg Oral Daily  . multivitamin with minerals  1 tablet Oral Daily  . pregabalin  50 mg Oral TID   Continuous Infusions: . piperacillin-tazobactam (ZOSYN)  IV 3.375 g (03/27/21 1320)    Procedures/Studies: CT ABDOMEN PELVIS W CONTRAST  Result Date: 03/25/2021 CLINICAL DATA:  Acute abdominal pain. History of MI, hypertension, congestive heart failure, breast cancer. EXAM: CT ABDOMEN AND PELVIS WITH CONTRAST TECHNIQUE: Multidetector CT imaging of the abdomen and pelvis was performed using the standard protocol following bolus administration of intravenous contrast. CONTRAST:  1103mL OMNIPAQUE IOHEXOL 300 MG/ML  SOLN COMPARISON:  02/19/2021  FINDINGS: Lower chest: Lung bases are clear. Hepatobiliary: Gallbladder wall thickening and edema with infiltration into the pericholecystic fat. Small stones layering in the gallbladder. Extrahepatic bile duct dilatation with suggestion of stones in the distal common bile duct. Moderate intrahepatic bile duct dilatation. No focal liver lesions. Small amount of perihepatic ascites. Pancreas: Unremarkable. No pancreatic ductal dilatation or surrounding inflammatory changes. Spleen: Normal in size without focal abnormality. Adrenals/Urinary Tract: No adrenal gland nodules. Complex hypoenhancing lesion demonstrated in the left kidney measuring 1.9 cm diameter. This may represent a renal cell carcinoma. Similar appearance to previous study. No hydronephrosis or hydroureter. Bladder wall is thickened, probably due to under distention although cystitis could also have this appearance. Stomach/Bowel: Stomach, small bowel, and colon are not abnormally distended. Scattered colonic diverticula without evidence of diverticulitis. Infiltration and wall thickening around the proximal duodenum probably is referred inflammation from the gallbladder. Duodenitis could also be present. Appendix is not identified. Vascular/Lymphatic: Aortic atherosclerosis. No enlarged abdominal or pelvic lymph nodes. Reproductive: Uterus is surgically absent. No abnormal adnexal masses. Other: No free air  in the abdomen. Moderate-sized periumbilical hernia containing fat. Musculoskeletal: Degenerative changes in the spine. Lumbar scoliosis convex towards the left. Sclerotic bone lesions consistent with metastatic disease and vertebral compression of L2. IMPRESSION: 1. Cholelithiasis with changes of acute cholecystitis. Bile duct dilatation with probable stones in the distal common bile duct. 2. Infiltration and wall thickening around the proximal duodenum probably is referred inflammation from the gallbladder. Duodenitis could also have this  appearance. 3. Complex hypoenhancing lesion in the left kidney may represent a renal cell carcinoma. No change since prior study. 4. Sclerotic bone lesions consistent with metastatic disease. Compression of L2. 5. Moderate-sized periumbilical hernia containing fat. 6. Aortic atherosclerosis. Aortic Atherosclerosis (ICD10-I70.0). Electronically Signed   By: Lucienne Capers M.D.   On: 03/25/2021 00:50   ECHOCARDIOGRAM COMPLETE  Result Date: 03/25/2021    ECHOCARDIOGRAM REPORT   Patient Name:   FAIGY HIBBEN Date of Exam: 03/25/2021 Medical Rec #:  [id number]         Height:       60.0 in Accession #:    [id number]        Weight:       200.2 lb Date of Birth:  [birth year]          BSA:          1.867 m Patient Age:    67 years          BP:           141/72 mmHg Patient Gender: F                 HR:           71 bpm. Exam Location:  [facility] Procedure: 2D Echo, Cardiac Doppler and Color Doppler Indications:    CHF  History:        Patient has prior history of Echocardiogram examinations, most                 recent 04/18/2020. CHF, CAD, COPD, Aortic Valve Disease and                 Aortic stenosis; Risk Factors:Hypertension, Dyslipidemia and                 morbid obestiy. Metastatic breast cancer.  Sonographer:    Dustin Flock RDCS Referring Phys: [phone] DAVID TAT IMPRESSIONS  1. Left ventricular ejection fraction, by estimation, is 50 to 55%. The left ventricle has low normal function. The left ventricle has no regional wall motion abnormalities. Left ventricular diastolic parameters are consistent with Grade I diastolic dysfunction (impaired relaxation).  2. Right ventricular systolic function is normal. The right ventricular size is normal. There is mildly elevated pulmonary artery systolic pressure. The estimated right ventricular systolic pressure is 0000000 mmHg.  3. Left atrial size was upper normal.  4. The mitral valve is grossly normal. Trivial mitral valve regurgitation.  5. The aortic valve is  tricuspid with some fusion of left and noncoronary cusps. There is moderate calcification of the aortic valve. Aortic valve regurgitation is not visualized. Moderate aortic valve stenosis. Aortic valve mean gradient measures 10.5 mmHg. Dimentionless index 0.31.  6. The inferior vena cava is normal in size with greater than 50% respiratory variability, suggesting right atrial pressure of 3 mmHg. FINDINGS  Left Ventricle: Left ventricular ejection fraction, by estimation, is 50 to 55%. The left ventricle has low normal function. The left ventricle has no regional wall motion abnormalities. The left ventricular internal cavity size  was normal in size. There is borderline left ventricular hypertrophy. Abnormal (paradoxical) septal motion, consistent with left bundle branch block. Left ventricular diastolic parameters are consistent with Grade I diastolic dysfunction (impaired relaxation). Right Ventricle: The right ventricular size is normal. No increase in right ventricular wall thickness. Right ventricular systolic function is normal. There is mildly elevated pulmonary artery systolic pressure. The tricuspid regurgitant velocity is 3.24  m/s, and with an assumed right atrial pressure of 3 mmHg, the estimated right ventricular systolic pressure is 55.7 mmHg. Left Atrium: Left atrial size was upper normal. Right Atrium: Right atrial size was normal in size. Pericardium: There is no evidence of pericardial effusion. Mitral Valve: The mitral valve is grossly normal. Mild mitral annular calcification. Trivial mitral valve regurgitation. Tricuspid Valve: The tricuspid valve is grossly normal. Tricuspid valve regurgitation is mild. Aortic Valve: The aortic valve is tricuspid. There is moderate calcification of the aortic valve. There is moderate aortic valve annular calcification. Aortic valve regurgitation is not visualized. Moderate aortic stenosis is present. Aortic valve mean gradient measures 10.5 mmHg. Aortic valve  peak gradient measures 18.1 mmHg. Aortic valve area, by VTI measures 1.09 cm. Pulmonic Valve: The pulmonic valve was grossly normal. Pulmonic valve regurgitation is trivial. Aorta: The aortic root is normal in size and structure. Venous: The inferior vena cava is normal in size with greater than 50% respiratory variability, suggesting right atrial pressure of 3 mmHg. IAS/Shunts: No atrial level shunt detected by color flow Doppler.  LEFT VENTRICLE PLAX 2D LVIDd:         4.35 cm  Diastology LVIDs:         3.18 cm  LV e' medial:    5.44 cm/s LV PW:         1.05 cm  LV E/e' medial:  11.7 LV IVS:        1.08 cm  LV e' lateral:   7.40 cm/s LVOT diam:     2.10 cm  LV E/e' lateral: 8.6 LV SV:         52 LV SV Index:   28 LVOT Area:     3.46 cm  RIGHT VENTRICLE RV Basal diam:  3.00 cm RV S prime:     7.07 cm/s TAPSE (M-mode): 2.1 cm LEFT ATRIUM             Index       RIGHT ATRIUM           Index LA diam:        3.80 cm 2.04 cm/m  RA Area:     11.70 cm LA Vol (A2C):   37.5 ml 20.08 ml/m RA Volume:   32.00 ml  17.14 ml/m LA Vol (A4C):   60.0 ml 32.13 ml/m LA Biplane Vol: 50.8 ml 27.20 ml/m  AORTIC VALVE AV Area (Vmax):    1.10 cm AV Area (Vmean):   1.16 cm AV Area (VTI):     1.09 cm AV Vmax:           213.00 cm/s AV Vmean:          149.000 cm/s AV VTI:            0.477 m AV Peak Grad:      18.1 mmHg AV Mean Grad:      10.5 mmHg LVOT Vmax:         67.90 cm/s LVOT Vmean:        50.000 cm/s LVOT VTI:  0.150 m LVOT/AV VTI ratio: 0.31  AORTA Ao Root diam: 2.70 cm MITRAL VALVE               TRICUSPID VALVE MV Area (PHT): 2.68 cm    TR Peak grad:   42.0 mmHg MV Decel Time: 283 msec    TR Vmax:        324.00 cm/s MV E velocity: 63.80 cm/s MV A velocity: 89.50 cm/s  SHUNTS MV E/A ratio:  0.71        Systemic VTI:  0.15 m                            Systemic Diam: 2.10 cm Rozann Lesches MD Electronically signed by Rozann Lesches MD Signature Date/Time: 03/25/2021/2:24:21 PM    Final     Irwin Brakeman,  MD  [facility]  If 7PM-7AM, please contact night-coverage [email or web address] Password [email or web address] 03/27/2021, 2:49 PM   LOS: 2 days

## 2021-03-27 NOTE — Anesthesia Preprocedure Evaluation (Addendum)
Anesthesia Evaluation  Patient identified by MRN, date of birth, ID band Patient awake    Reviewed: Allergy & Precautions, NPO status , Patient's Chart, lab work & pertinent test results, reviewed documented beta blocker date and time   Airway Mallampati: I  TM Distance: >3 FB Neck ROM: Full    Dental  (+) Dental Advisory Given, Upper Dentures, Partial Lower   Pulmonary shortness of breath and with exertion, COPD, former smoker,    Pulmonary exam normal breath sounds clear to auscultation       Cardiovascular Exercise Tolerance: Poor hypertension, Pt. on medications and Pt. on home beta blockers + CAD, + Past MI, + Cardiac Stents and +CHF  + dysrhythmias Atrial Fibrillation and Supra Ventricular Tachycardia + Valvular Problems/Murmurs AS  Rhythm:Regular Rate:Normal + Systolic murmurs 1. Left ventricular ejection fraction, by estimation, is 50 to 55%. The left ventricle has low normal function. The left ventricle has no regional wall motion abnormalities. Left ventricular diastolic parameters are consistent with Grade I diastolic dysfunction (impaired relaxation).  2. Right ventricular systolic function is normal. The right ventricular size is normal. There is mildly elevated pulmonary artery systolic pressure. The estimated right ventricular systolic pressure is 16.1 mmHg.  3. Left atrial size was upper normal.  4. The mitral valve is grossly normal. Trivial mitral valve  regurgitation.  5. The aortic valve is tricuspid with some fusion of left and noncoronary cusps. There is moderate calcification of the aortic valve. Aortic valve regurgitation is not visualized. Moderate aortic valve stenosis. Aortic valve mean gradient measures 10.5 mmHg. Dimentionless index 0.31.  6. The inferior vena cava is normal in size with greater than 50% respiratory variability, suggesting right atrial pressure of 3 mmHg.   25-Mar-2021 17:29:13 [place] System-AP-300 ROUTINE RECORD 01-27-1946 (85 yr) Female Caucasian Room:A309 Loc:903 Technician: Harlow Asa Test ind: Vent. rate 75 BPM PR interval * ms QRS duration 146 ms QT/QTcB 410/457 ms P-R-T axes * -40 142 Normal sinus rhythm Left axis deviation Left bundle branch block Abnormal ECG Confirmed by Asencion Noble [phone]) on 03/26/2021 7:44:19 PM   Neuro/Psych PSYCHIATRIC DISORDERS Anxiety Depression Bipolar Disorder  Neuromuscular disease    GI/Hepatic negative GI ROS, Neg liver ROS,   Endo/Other  negative endocrine ROS  Renal/GU negative Renal ROS     Musculoskeletal  (+) Arthritis  (lumbar radiculopathy),   Abdominal   Peds  Hematology negative hematology ROS (+)   Anesthesia Other Findings Cardiology initially consulted for preoperative cardiac clearance for upcoming ERCP. Was cleared to proceed as outlined in full consult note from 03/25/2021. Was recommended to continue at least one antiplatelet peri-procedure then resume DAPT once deemed safe from a surgical perspective.   [facility] will sign off. Please re-consult if needed.  Medication Recommendations: As outlined on 03/25/2021. Other recommendations (labs, testing, etc): None Follow up as an outpatient: Keep routine outpatient follow-up with Dr. Harl Bowie  Metastatic breast cancer     Reproductive/Obstetrics negative OB ROS                           Anesthesia Physical Anesthesia Plan  ASA: III  Anesthesia Plan: General   Post-op Pain Management:    Induction: Intravenous  PONV Risk Score and Plan: 4 or greater and Ondansetron and Dexamethasone  Airway Management Planned: Oral ETT  Additional Equipment:   Intra-op Plan:   Post-operative Plan: Extubation in OR  Informed Consent: I have reviewed the patients History and  Physical, chart, labs and discussed the procedure including the risks, benefits and alternatives for the proposed anesthesia with the  patient or authorized representative who has indicated his/her understanding and acceptance.     Dental advisory given  Plan Discussed with: CRNA and Surgeon  Anesthesia Plan Comments:        Anesthesia Quick Evaluation

## 2021-03-28 ENCOUNTER — Encounter (HOSPITAL_COMMUNITY)
Admission: EM | Disposition: A | Discharge status: Home Health | Payer: Self-pay | Source: Home / Self Care | Attending: Family Medicine

## 2021-03-28 ENCOUNTER — Inpatient Hospital Stay (HOSPITAL_COMMUNITY): Payer: Medicare Other | Admitting: Certified Registered Nurse Anesthetist

## 2021-03-28 ENCOUNTER — Encounter (HOSPITAL_COMMUNITY): Payer: Self-pay | Admitting: Internal Medicine

## 2021-03-28 ENCOUNTER — Inpatient Hospital Stay (HOSPITAL_COMMUNITY): Payer: Medicare Other

## 2021-03-28 HISTORY — PX: SPHINCTEROTOMY: SHX5544

## 2021-03-28 HISTORY — PX: REMOVAL OF STONES: SHX5545

## 2021-03-28 HISTORY — PX: ERCP: SHX5425

## 2021-03-28 LAB — COMPREHENSIVE METABOLIC PANEL
ALT: 82 U/L — ABNORMAL HIGH (ref 0–44)
AST: 56 U/L — ABNORMAL HIGH (ref 15–41)
Albumin: 3.1 g/dL — ABNORMAL LOW (ref 3.5–5.0)
Alkaline Phosphatase: 84 U/L (ref 38–126)
Anion gap: 7 (ref 5–15)
BUN: 9 mg/dL (ref 8–23)
CO2: 33 mmol/L — ABNORMAL HIGH (ref 22–32)
Calcium: 9.7 mg/dL (ref 8.9–10.3)
Chloride: 103 mmol/L (ref 98–111)
Creatinine, Ser: 0.67 mg/dL (ref 0.44–1.00)
GFR, Estimated: >60 mL/min (ref >60)
Glucose, Bld: 97 mg/dL (ref 70–99)
Potassium: 4 mmol/L (ref 3.5–5.1)
Sodium: 143 mmol/L (ref 135–145)
Total Bilirubin: 1.3 mg/dL — ABNORMAL HIGH (ref 0.3–1.2)
Total Protein: 5.9 g/dL — ABNORMAL LOW (ref 6.5–8.1)

## 2021-03-28 LAB — CBC WITH DIFFERENTIAL/PLATELET
Abs Immature Granulocytes: 0.02 10*3/uL (ref 0.00–0.07)
Basophils Absolute: 0 10*3/uL (ref 0.0–0.1)
Basophils Relative: 1 %
Eosinophils Absolute: 0.2 10*3/uL (ref 0.0–0.5)
Eosinophils Relative: 5 %
HCT: 33 % — ABNORMAL LOW (ref 36.0–46.0)
Hemoglobin: 10.8 g/dL — ABNORMAL LOW (ref 12.0–15.0)
Immature Granulocytes: 0 %
Lymphocytes Relative: 37 %
Lymphs Abs: 1.8 10*3/uL (ref 0.7–4.0)
MCH: 32.3 pg (ref 26.0–34.0)
MCHC: 32.7 g/dL (ref 30.0–36.0)
MCV: 98.8 fL (ref 80.0–100.0)
Monocytes Absolute: 0.4 10*3/uL (ref 0.1–1.0)
Monocytes Relative: 9 %
Neutro Abs: 2.4 10*3/uL (ref 1.7–7.7)
Neutrophils Relative %: 48 %
Platelets: 189 10*3/uL (ref 150–400)
RBC: 3.34 MIL/uL — ABNORMAL LOW (ref 3.87–5.11)
RDW: 13.6 % (ref 11.5–15.5)
WBC: 4.9 10*3/uL (ref 4.0–10.5)
nRBC: 0 % (ref 0.0–0.2)

## 2021-03-28 LAB — GLUCOSE, CAPILLARY: Glucose-Capillary: 160 mg/dL — ABNORMAL HIGH (ref 70–99)

## 2021-03-28 LAB — MAGNESIUM: Magnesium: 1.7 mg/dL (ref 1.7–2.4)

## 2021-03-28 SURGERY — ERCP, WITH INTERVENTION IF INDICATED
Anesthesia: General

## 2021-03-28 MED ORDER — FENTANYL CITRATE (PF) 100 MCG/2ML IJ SOLN
INTRAMUSCULAR | Status: AC
  Filled 2021-03-28: qty 2

## 2021-03-28 MED ORDER — SUGAMMADEX SODIUM 500 MG/5ML IV SOLN
INTRAVENOUS | Status: DC | PRN
  Administered 2021-03-28: 200 mg via INTRAVENOUS

## 2021-03-28 MED ORDER — DEXAMETHASONE SODIUM PHOSPHATE 10 MG/ML IJ SOLN
INTRAMUSCULAR | Status: DC | PRN
  Administered 2021-03-28: 10 mg via INTRAVENOUS

## 2021-03-28 MED ORDER — CHLORHEXIDINE GLUCONATE CLOTH 2 % EX PADS: 6.0000 | MEDICATED_PAD | Freq: Once | CUTANEOUS | Status: DC

## 2021-03-28 MED ORDER — SIMETHICONE 40 MG/0.6ML PO SUSP
ORAL | Status: AC
  Filled 2021-03-28: qty 0.6

## 2021-03-28 MED ORDER — GLUCAGON HCL RDNA (DIAGNOSTIC) 1 MG IJ SOLR
INTRAMUSCULAR | Status: DC | PRN
  Administered 2021-03-28 (×3): .25 mg via INTRAVENOUS

## 2021-03-28 MED ORDER — PHENYLEPHRINE HCL (PRESSORS) 10 MG/ML IV SOLN
INTRAVENOUS | Status: DC | PRN
  Administered 2021-03-28 (×2): 200 ug via INTRAVENOUS

## 2021-03-28 MED ORDER — ORAL CARE MOUTH RINSE: 15.0000 mL | Freq: Once | OROMUCOSAL | Status: AC

## 2021-03-28 MED ORDER — SODIUM CHLORIDE 0.9 % IV SOLN
INTRAVENOUS | Status: DC | PRN
  Administered 2021-03-28: 50 mL

## 2021-03-28 MED ORDER — GLUCAGON HCL RDNA (DIAGNOSTIC) 1 MG IJ SOLR
INTRAMUSCULAR | Status: AC
  Filled 2021-03-28: qty 2

## 2021-03-28 MED ORDER — FENTANYL CITRATE (PF) 100 MCG/2ML IJ SOLN
INTRAMUSCULAR | Status: DC | PRN
  Administered 2021-03-28: 100 ug via INTRAVENOUS

## 2021-03-28 MED ORDER — SODIUM CHLORIDE 0.9 % IV SOLN: 2.0000 g | INTRAVENOUS | Status: DC

## 2021-03-28 MED ORDER — FENTANYL CITRATE (PF) 100 MCG/2ML IJ SOLN: 25.0000 ug | INTRAMUSCULAR | Status: DC | PRN

## 2021-03-28 MED ORDER — CHLORHEXIDINE GLUCONATE 0.12 % MT SOLN
15.0000 mL | Freq: Once | OROMUCOSAL | Status: AC
  Administered 2021-03-28: 15 mL via OROMUCOSAL

## 2021-03-28 MED ORDER — STERILE WATER FOR IRRIGATION IR SOLN
Status: DC | PRN
  Administered 2021-03-28: 1000 mL

## 2021-03-28 MED ORDER — ROCURONIUM BROMIDE 100 MG/10ML IV SOLN
INTRAVENOUS | Status: DC | PRN
  Administered 2021-03-28: 60 mg via INTRAVENOUS

## 2021-03-28 MED ORDER — PROPOFOL 10 MG/ML IV BOLUS
INTRAVENOUS | Status: AC
  Filled 2021-03-28: qty 20

## 2021-03-28 MED ORDER — PROPOFOL 10 MG/ML IV BOLUS
INTRAVENOUS | Status: DC | PRN
  Administered 2021-03-28: 140 mg via INTRAVENOUS

## 2021-03-28 MED ORDER — SODIUM CHLORIDE 0.9 % IV SOLN
INTRAVENOUS | Status: AC
  Filled 2021-03-28: qty 100

## 2021-03-28 MED ORDER — LACTATED RINGERS IV SOLN
INTRAVENOUS | Status: DC
  Administered 2021-03-28: 1000 mL via INTRAVENOUS

## 2021-03-28 MED ORDER — LIDOCAINE HCL (CARDIAC) PF 100 MG/5ML IV SOSY
PREFILLED_SYRINGE | INTRAVENOUS | Status: DC | PRN
  Administered 2021-03-28: 60 mg via INTRAVENOUS

## 2021-03-28 SURGICAL SUPPLY — 22 items
BALLN RETRIEVAL 12X15 (BALLOONS) IMPLANT
BASKET TRAPEZOID 3X6 (MISCELLANEOUS) IMPLANT
BASKET TRAPEZOID LITHO 2.0X5 (MISCELLANEOUS) IMPLANT
DEVICE INFLATION ENCORE 26 (MISCELLANEOUS) IMPLANT
DEVICE LOCKING W-BIOPSY CAP (MISCELLANEOUS) ×3 IMPLANT
GUIDEWIRE HYDRA JAGWIRE .35 (WIRE) IMPLANT
GUIDEWIRE JAG HINI 025X260CM (WIRE) IMPLANT
KIT ENDO PROCEDURE PEN (KITS) ×3 IMPLANT
KIT TURNOVER KIT A (KITS) IMPLANT
LUBRICANT JELLY 4.5OZ STERILE (MISCELLANEOUS) IMPLANT
PAD ARMBOARD 7.5X6 YLW CONV (MISCELLANEOUS) ×3 IMPLANT
POSITIONER HEAD 8X9X4 ADT (SOFTGOODS) IMPLANT
SCOPE SPY DS DISPOSABLE (MISCELLANEOUS) IMPLANT
SNARE ROTATE MED OVAL 20MM (MISCELLANEOUS) IMPLANT
SNARE SHORT THROW 13M SML OVAL (MISCELLANEOUS) IMPLANT
SPHINCTEROTOME AUTOTOME .25 (MISCELLANEOUS) IMPLANT
SPHINCTEROTOME HYDRATOME 44 (MISCELLANEOUS) IMPLANT
SYSTEM CONTINUOUS INJECTION (MISCELLANEOUS) IMPLANT
TUBING INSUFFLATOR CO2MPACT (TUBING) ×3 IMPLANT
WALLSTENT METAL COVERED 10X60 (STENTS) IMPLANT
WALLSTENT METAL COVERED 10X80 (STENTS) IMPLANT
WATER STERILE IRR 1000ML POUR (IV SOLUTION) ×3 IMPLANT

## 2021-03-28 NOTE — Op Note (Signed)
[facility] Patient Name: Stacey [name] Procedure Date: 03/28/2021 11:26 AM MRN: [id number] Date of Birth: Nov 19, 1946 Attending MD: Hildred Laser , MD CSN: [id number] Age: 75 Admit Type: Inpatient Procedure:                ERCP Indications:              Bile duct stone(s) Providers:                Hildred Laser, MD, Gwenlyn Fudge, RN, Nelma Rothman,                            Technician Referring MD:             Irwin Brakeman, MD and Blake Divine, MD Medicines:                General Anesthesia Complications:            No immediate complications. Estimated Blood Loss:     Estimated blood loss was minimal. Procedure:                Pre-Anesthesia Assessment:                           - Prior to the procedure, a History and Physical                            was performed, and patient medications and                            allergies were reviewed. The patient's tolerance of                            previous anesthesia was also reviewed. The risks                            and benefits of the procedure and the sedation                            options and risks were discussed with the patient.                            All questions were answered, and informed consent                            was obtained. Prior Anticoagulants: The patient                            last took Plavix (clopidogrel) 4 days prior to the                            procedure. ASA Grade Assessment: III - A patient                            with severe systemic disease. After reviewing the  risks and benefits, the patient was deemed in                            satisfactory condition to undergo the procedure.                           After obtaining informed consent, the scope was                            passed under direct vision. Throughout the                            procedure, the patient's blood pressure, pulse, and                            oxygen  saturations were monitored continuously. The                            [employer] D single use                            duodenoscope was introduced through the and used to                            inject contrast into and used to inject contrast                            into the bile duct. The ERCP was accomplished                            without difficulty. The patient tolerated the                            procedure well. Scope In: Scope Out: Findings:      The scout film was normal. The esophagus was successfully intubated       under direct vision. The scope was advanced to a normal major papilla in       the descending duodenum without detailed examination of the pharynx,       larynx and associated structures, and upper GI tract. The upper GI tract       was grossly normal. The major papilla was normal. The bile duct was       deeply cannulated with the Hydratome sphincterotome. Contrast was       injected. I personally interpreted the bile duct images. Ductal flow of       contrast was adequate. Image quality was adequate. Contrast extended to       the entire biliary tree. The main bile duct was mildly dilated. The       common bile duct contained filling defect(s) thought to be a stone. The       biliary sphincterotomy was extended to a total of 7 mm in length with a       braided Hydratome sphincterotome using ERBE electrocautery. The       sphincterotomy oozed blood. The biliary tree was swept with a 9 mm  balloon starting at the bifurcation. Three stones were removed. No       stones remained. Impression:               - The major papilla appeared normal.                           - CHD and CBD mildly dilated with filling defects                            in distal CBD.                           - Biliary sphincterotomy performed and 3 pigmented                            stones removed with stone balloon extractor.                            Comment: Pictures showing pigmented stones lost. Moderate Sedation:      Per Anesthesia Care Recommendation:           - Avoid aspirin and nonsteroidal anti-inflammatory                            medicines for 3 days.                           - Continue present medications.                           - Clear liquid diet.                           - Labs in AM.                           - Lap cholecystectomy planned for tomorrow. Procedure Code(s):        --- Professional ---                           [phone], Endoscopic retrograde                            cholangiopancreatography (ERCP); with removal of                            calculi/debris from biliary/pancreatic duct(s)                           43262, Endoscopic retrograde                            cholangiopancreatography (ERCP); with                            sphincterotomy/papillotomy Diagnosis Code(s):        --- Professional ---  K80.50, Calculus of bile duct without cholangitis                            or cholecystitis without obstruction                           K83.8, Other specified diseases of biliary tract                           R93.2, Abnormal findings on diagnostic imaging of                            liver and biliary tract CPT copyright 2019 American Medical Association. All rights reserved. The codes documented in this report are preliminary and upon coder review may  be revised to meet current compliance requirements. Hildred Laser, MD Hildred Laser, MD 03/28/2021 2:12:28 PM This report has been signed electronically. Number of Addenda: 0

## 2021-03-28 NOTE — Anesthesia Postprocedure Evaluation (Signed)
Anesthesia Post Note  Patient: [name]  Procedure(s) Performed: ENDOSCOPIC RETROGRADE CHOLANGIOPANCREATOGRAPHY (ERCP) (N/A ) SPHINCTEROTOMY REMOVAL OF STONES  Patient location during evaluation: PACU Anesthesia Type: General Level of consciousness: awake and alert and oriented Pain management: pain level controlled Vital Signs Assessment: post-procedure vital signs reviewed and stable Respiratory status: spontaneous breathing and nonlabored ventilation Cardiovascular status: blood pressure returned to baseline and stable Postop Assessment: no apparent nausea or vomiting Anesthetic complications: no   No complications documented.   Last Vitals:  Vitals:   03/28/21 1416 03/28/21 1430  BP: (!) 187/94 (!) 130/96  Pulse: 88 85  Resp: (!) 24 11  Temp:    SpO2: 95% 97%    Last Pain:  Vitals:   03/28/21 1430  TempSrc:   PainSc: 0-No pain                 Caliber Landess C Niccole Witthuhn

## 2021-03-28 NOTE — Progress Notes (Signed)
PROGRESS NOTE  SELA FALK [id number] DOB: 1946/02/22 DOA: 03/24/2021 PCP: Sela Hilding   Brief History:  75 year old female with a history of coronary artery disease status post DES to the RCA December 8250, systolic & diastolic CHF, COPD, hypertension, metastatic breast cancer, hyperlipidemia, PSVT, chronic pain syndrome status post intrathecal pump placement presenting with abdominal pain that began on 03/24/2021 that was severe rating 10/10 diffuse in nature.  The patient has subjective fevers and chills.  She had nausea without emesis.  She denies any diarrhea.  She denies any headache, cough, hemoptysis, chest pain, worsening shortness of breath, hematochezia, melena, dysuria, hematuria. In the emergency department, the patient was afebrile hemodynamically stable with oxygen saturation 100% on 3 L.  CT of the abdomen and pelvis showed gallbladder wall thickening and edema with infiltration into the pericholecystic fat.  There was extrahepatic ductal dilatation with suggestion of stones in the distal CBD and moderate intrahepatic biliary ductal dilatation.  There was infiltration and thickening around the proximal duodenum probably referred from the gallbladder.  The patient was started on Zosyn.  GI and general surgery were consulted to assist.  Assessment/Plan: Acute cholecystitis -General surgery consult appreciated-->plan lap chole 4/29 with Dr. Constance Haw -Continue IV Zosyn -Judicious IV fluids -Judicious opioids for pain control  Transaminasemia with hyperbilirubinemia -GI consult appreciated -ERCP planned 03/28/21 (allowing plavix washout) -due to cholecystitis/choledocholithiasis -ERCP was successful with 3 pigmented stones removed  Chronic systolic and diastolic CHF -Appears clinically euvolemic -04/18/2020 echo EF 35-40%, G1DD, mild to moderate AAS, mild MR -Continue metoprolol succinate -03/25/21 Echo EF 50-55%, G1DD, no WMA, trivial MR  Chronic  respiratory failure with hypoxia -Patient is normally on 1 L nasal cannula at nighttime -Monitor clinically -Incentive spirometry  Coronary artery disease -No chest pain presently -aspirin temporarily on hold -Continue metoprolol succinate  Chronic pain syndrome -Patient has intrathecal morphine pump infusing 5.1 mg per 24-hour. -Continue home dose oxycodone and Lyrica  Metastatic breast cancer to the bone -02/19/2021 bone scan--negative for bone metastasis -Outpatient follow-up with Dr. Delton Coombes  COPD -Stable without exacerbation  Chronic L1/L2 compression fracture -Continue home opioid regimen  Essential hypertension -Continue metoprolol succinate   Hyperlipidemia -Holding Lipitor secondary to elevated LFTs initially  Hypokalemia -Magnesium stable around 1.7  Macrocytic anemia -Check B12 and folate -baseline Hgb 11-12  Morbid Obesity -BMI 39.09 -lifestyle modification  Status is: Inpatient  Appropriate for inpatient due to severity of illness  Dispo: The patient is from: Home  Anticipated d/c is to: Home  Patient currently is not medically stable to d/c.              Difficult to place patient No  Family Communication:  son present at bedside  Consultants:  GI, general surgery  Code Status:  FULL   DVT Prophylaxis:  SCDs   Procedures: As Listed in Progress Note Above  Antibiotics: Zosyn 4/25>>  Subjective: Patient anxious seen just prior to ERCP, pain mostly unchanged. No emesis.    Objective: Vitals:   03/27/21 1300 03/27/21 2123 03/28/21 0512 03/28/21 1036  BP: (!) 152/78 (!) 174/90 (!) 155/65 (!) 161/83  Pulse: 80 85 80   Resp: 18 20 17 17   Temp: 97.6 F (36.4 C) 98 F (36.7 C) 98.6 F (37 C) 98.6 F (37 C)  TempSrc: Oral   Oral  SpO2: 90% 92% 97% 95%  Weight:      Height:        Intake/Output Summary (  Last 24 hours) at 03/28/2021 1424 Last data filed at 03/28/2021 1413 Gross per  24 hour  Intake 1897.61 ml  Output 2800 ml  Net -902.39 ml   Weight change:  Exam:   General:  Pt is alert, follows commands appropriately, not in acute distress  HEENT: No icterus, No thrush, No neck mass, [place]/AT  Cardiovascular: irregularly irregular S1/S2, no rubs, no gallops  Respiratory: BBS clear No wheeze  Abdomen: persistent epigastric and RUQ  Tenderness with murphy sign, no guarding  Extremities: No edema, No lymphangitis, No petechiae, No rashes, no synovitis  Data Reviewed: I have personally reviewed following labs and imaging studies Basic Metabolic Panel: Recent Labs  Lab 03/24/21 2201 03/25/21 0455 03/26/21 0700 03/27/21 0503 03/28/21 0430  NA 139 142 144 141 143  K 3.3* 3.6 3.6 3.5 4.0  CL 99 101 104 101 103  CO2 28 32 33* 30 33*  GLUCOSE 180* 134* 90 95 97  BUN 24* 22 18 13 9   CREATININE 0.75 0.76 0.85 0.76 0.67  CALCIUM 10.0 9.4 9.4 9.3 9.7  MG  --  1.7 1.9 1.7 1.7  PHOS  --  4.6  --   --   --    Liver Function Tests: Recent Labs  Lab 03/24/21 2201 03/25/21 0455 03/26/21 0700 03/27/21 0503 03/28/21 0430  AST 123* 517* 177* 87* 56*  ALT 52* 259* 177* 117* 82*  ALKPHOS 92 99 89 78 84  BILITOT 2.5* 1.6* 1.4* 1.4* 1.3*  PROT 6.6 5.4* 5.4* 5.6* 5.9*  ALBUMIN 3.6 2.9* 2.8* 3.0* 3.1*   Recent Labs  Lab 03/24/21 2201  LIPASE 20   No results for input(s): AMMONIA in the last 168 hours. Coagulation Profile: Recent Labs  Lab 03/25/21 0455  INR 1.0   CBC: Recent Labs  Lab 03/24/21 2201 03/25/21 0455 03/26/21 0700 03/28/21 0430  WBC 11.3* 4.7 4.0 4.9  NEUTROABS 8.9*  --   --  2.4  HGB 12.6 10.8* 10.6* 10.8*  HCT 37.0 33.2* 33.1* 33.0*  MCV 95.9 100.6* 101.8* 98.8  PLT 261 202 182 189   Cardiac Enzymes: No results for input(s): CKTOTAL, CKMB, CKMBINDEX, TROPONINI in the last 168 hours. BNP: Invalid input(s): POCBNP CBG: Recent Labs  Lab 03/28/21 1420  GLUCAP 160*   HbA1C: No results for input(s): HGBA1C in the last 72  hours. Urine analysis:    Component Value Date/Time   COLORURINE AMBER (A) 03/24/2021 2201   APPEARANCEUR CLOUDY (A) 03/24/2021 2201   LABSPEC 1.038 (H) 03/24/2021 2201   PHURINE 5.0 03/24/2021 2201   GLUCOSEU 50 (A) 03/24/2021 2201   HGBUR NEGATIVE 03/24/2021 2201   BILIRUBINUR NEGATIVE 03/24/2021 2201   KETONESUR 5 (A) 03/24/2021 2201   PROTEINUR 100 (A) 03/24/2021 2201   NITRITE NEGATIVE 03/24/2021 2201   LEUKOCYTESUR NEGATIVE 03/24/2021 2201   Sepsis Labs:  Recent Results (from the past 240 hour(s))  Resp Panel by RT-PCR (Flu A&B, Covid) Nasopharyngeal Swab     Status: None   Collection Time: 03/25/21  1:48 AM   Specimen: Nasopharyngeal Swab; Nasopharyngeal(NP) swabs in vial transport medium  Result Value Ref Range Status   SARS Coronavirus 2 by RT PCR NEGATIVE NEGATIVE Final    Comment: (NOTE) SARS-CoV-2 target nucleic acids are NOT DETECTED.  The SARS-CoV-2 RNA is generally detectable in upper respiratory specimens during the acute phase of infection. The lowest concentration of SARS-CoV-2 viral copies this assay can detect is 138 copies/mL. A negative result does not preclude SARS-Cov-2 infection and should  not be used as the sole basis for treatment or other patient management decisions. A negative result may occur with  improper specimen collection/handling, submission of specimen other than nasopharyngeal swab, presence of viral mutation(s) within the areas targeted by this assay, and inadequate number of viral copies(<138 copies/mL). A negative result must be combined with clinical observations, patient history, and epidemiological information. The expected result is Negative.  Fact Sheet for Patients:  EntrepreneurPulse.com.au  Fact Sheet for Healthcare Providers:  IncredibleEmployment.be  This test is no t yet approved or cleared by the [place] FDA and  has been authorized for detection and/or diagnosis of SARS-CoV-2  by FDA under an Emergency Use Authorization (EUA). This EUA will remain  in effect (meaning this test can be used) for the duration of the COVID-19 declaration under Section 564(b)(1) of the Act, 21 U.S.C.section 360bbb-3(b)(1), unless the authorization is terminated  or revoked sooner.       Influenza A by PCR NEGATIVE NEGATIVE Final   Influenza B by PCR NEGATIVE NEGATIVE Final    Comment: (NOTE) The Xpert Xpress SARS-CoV-2/FLU/RSV plus assay is intended as an aid in the diagnosis of influenza from Nasopharyngeal swab specimens and should not be used as a sole basis for treatment. Nasal washings and aspirates are unacceptable for Xpert Xpress SARS-CoV-2/FLU/RSV testing.  Fact Sheet for Patients: EntrepreneurPulse.com.au  Fact Sheet for Healthcare Providers: IncredibleEmployment.be  This test is not yet approved or cleared by the [place] FDA and has been authorized for detection and/or diagnosis of SARS-CoV-2 by FDA under an Emergency Use Authorization (EUA). This EUA will remain in effect (meaning this test can be used) for the duration of the COVID-19 declaration under Section 564(b)(1) of the Act, 21 U.S.C. section 360bbb-3(b)(1), unless the authorization is terminated or revoked.  Performed at [facility], [address], [place], [place] [address]      Scheduled Meds: . [MAR Hold] vitamin C  500 mg Oral Daily  . [MAR Hold] metoprolol succinate  150 mg Oral Daily  . [MAR Hold] multivitamin with minerals  1 tablet Oral Daily  . [MAR Hold] pregabalin  50 mg Oral TID  . simethicone       Continuous Infusions: . [MAR Hold] piperacillin-tazobactam (ZOSYN)  IV 3.375 g (03/28/21 0547)  . sodium chloride      Procedures/Studies: CT ABDOMEN PELVIS W CONTRAST  Result Date: 03/25/2021 CLINICAL DATA:  Acute abdominal pain. History of MI, hypertension, congestive heart failure, breast cancer. EXAM: CT ABDOMEN AND PELVIS WITH CONTRAST  TECHNIQUE: Multidetector CT imaging of the abdomen and pelvis was performed using the standard protocol following bolus administration of intravenous contrast. CONTRAST:  162mL OMNIPAQUE IOHEXOL 300 MG/ML  SOLN COMPARISON:  02/19/2021 FINDINGS: Lower chest: Lung bases are clear. Hepatobiliary: Gallbladder wall thickening and edema with infiltration into the pericholecystic fat. Small stones layering in the gallbladder. Extrahepatic bile duct dilatation with suggestion of stones in the distal common bile duct. Moderate intrahepatic bile duct dilatation. No focal liver lesions. Small amount of perihepatic ascites. Pancreas: Unremarkable. No pancreatic ductal dilatation or surrounding inflammatory changes. Spleen: Normal in size without focal abnormality. Adrenals/Urinary Tract: No adrenal gland nodules. Complex hypoenhancing lesion demonstrated in the left kidney measuring 1.9 cm diameter. This may represent a renal cell carcinoma. Similar appearance to previous study. No hydronephrosis or hydroureter. Bladder wall is thickened, probably due to under distention although cystitis could also have this appearance. Stomach/Bowel: Stomach, small bowel, and colon are not abnormally distended. Scattered colonic diverticula without evidence  of diverticulitis. Infiltration and wall thickening around the proximal duodenum probably is referred inflammation from the gallbladder. Duodenitis could also be present. Appendix is not identified. Vascular/Lymphatic: Aortic atherosclerosis. No enlarged abdominal or pelvic lymph nodes. Reproductive: Uterus is surgically absent. No abnormal adnexal masses. Other: No free air in the abdomen. Moderate-sized periumbilical hernia containing fat. Musculoskeletal: Degenerative changes in the spine. Lumbar scoliosis convex towards the left. Sclerotic bone lesions consistent with metastatic disease and vertebral compression of L2. IMPRESSION: 1. Cholelithiasis with changes of acute cholecystitis.  Bile duct dilatation with probable stones in the distal common bile duct. 2. Infiltration and wall thickening around the proximal duodenum probably is referred inflammation from the gallbladder. Duodenitis could also have this appearance. 3. Complex hypoenhancing lesion in the left kidney may represent a renal cell carcinoma. No change since prior study. 4. Sclerotic bone lesions consistent with metastatic disease. Compression of L2. 5. Moderate-sized periumbilical hernia containing fat. 6. Aortic atherosclerosis. Aortic Atherosclerosis (ICD10-I70.0). Electronically Signed   By: Lucienne Capers M.D.   On: 03/25/2021 00:50   ECHOCARDIOGRAM COMPLETE  Result Date: 03/25/2021    ECHOCARDIOGRAM REPORT   Patient Name:   JERICHO CADDICK Date of Exam: 03/25/2021 Medical Rec #:  [id number]         Height:       60.0 in Accession #:    [id number]        Weight:       200.2 lb Date of Birth:  05/10/1946          BSA:          1.867 m Patient Age:    47 years          BP:           141/72 mmHg Patient Gender: F                 HR:           71 bpm. Exam Location:  [facility] Procedure: 2D Echo, Cardiac Doppler and Color Doppler Indications:    CHF  History:        Patient has prior history of Echocardiogram examinations, most                 recent 04/18/2020. CHF, CAD, COPD, Aortic Valve Disease and                 Aortic stenosis; Risk Factors:Hypertension, Dyslipidemia and                 morbid obestiy. Metastatic breast cancer.  Sonographer:    Dustin Flock RDCS Referring Phys: [id number] DAVID TAT IMPRESSIONS  1. Left ventricular ejection fraction, by estimation, is 50 to 55%. The left ventricle has low normal function. The left ventricle has no regional wall motion abnormalities. Left ventricular diastolic parameters are consistent with Grade I diastolic dysfunction (impaired relaxation).  2. Right ventricular systolic function is normal. The right ventricular size is normal. There is mildly elevated pulmonary artery  systolic pressure. The estimated right ventricular systolic pressure is 0000000 mmHg.  3. Left atrial size was upper normal.  4. The mitral valve is grossly normal. Trivial mitral valve regurgitation.  5. The aortic valve is tricuspid with some fusion of left and noncoronary cusps. There is moderate calcification of the aortic valve. Aortic valve regurgitation is not visualized. Moderate aortic valve stenosis. Aortic valve mean gradient measures 10.5 mmHg. Dimentionless index 0.31.  6. The inferior vena cava is normal in size  with greater than 50% respiratory variability, suggesting right atrial pressure of 3 mmHg. FINDINGS  Left Ventricle: Left ventricular ejection fraction, by estimation, is 50 to 55%. The left ventricle has low normal function. The left ventricle has no regional wall motion abnormalities. The left ventricular internal cavity size was normal in size. There is borderline left ventricular hypertrophy. Abnormal (paradoxical) septal motion, consistent with left bundle branch block. Left ventricular diastolic parameters are consistent with Grade I diastolic dysfunction (impaired relaxation). Right Ventricle: The right ventricular size is normal. No increase in right ventricular wall thickness. Right ventricular systolic function is normal. There is mildly elevated pulmonary artery systolic pressure. The tricuspid regurgitant velocity is 3.24  m/s, and with an assumed right atrial pressure of 3 mmHg, the estimated right ventricular systolic pressure is 60.4 mmHg. Left Atrium: Left atrial size was upper normal. Right Atrium: Right atrial size was normal in size. Pericardium: There is no evidence of pericardial effusion. Mitral Valve: The mitral valve is grossly normal. Mild mitral annular calcification. Trivial mitral valve regurgitation. Tricuspid Valve: The tricuspid valve is grossly normal. Tricuspid valve regurgitation is mild. Aortic Valve: The aortic valve is tricuspid. There is moderate calcification  of the aortic valve. There is moderate aortic valve annular calcification. Aortic valve regurgitation is not visualized. Moderate aortic stenosis is present. Aortic valve mean gradient measures 10.5 mmHg. Aortic valve peak gradient measures 18.1 mmHg. Aortic valve area, by VTI measures 1.09 cm. Pulmonic Valve: The pulmonic valve was grossly normal. Pulmonic valve regurgitation is trivial. Aorta: The aortic root is normal in size and structure. Venous: The inferior vena cava is normal in size with greater than 50% respiratory variability, suggesting right atrial pressure of 3 mmHg. IAS/Shunts: No atrial level shunt detected by color flow Doppler.  LEFT VENTRICLE PLAX 2D LVIDd:         4.35 cm  Diastology LVIDs:         3.18 cm  LV e' medial:    5.44 cm/s LV PW:         1.05 cm  LV E/e' medial:  11.7 LV IVS:        1.08 cm  LV e' lateral:   7.40 cm/s LVOT diam:     2.10 cm  LV E/e' lateral: 8.6 LV SV:         52 LV SV Index:   28 LVOT Area:     3.46 cm  RIGHT VENTRICLE RV Basal diam:  3.00 cm RV S prime:     7.07 cm/s TAPSE (M-mode): 2.1 cm LEFT ATRIUM             Index       RIGHT ATRIUM           Index LA diam:        3.80 cm 2.04 cm/m  RA Area:     11.70 cm LA Vol (A2C):   37.5 ml 20.08 ml/m RA Volume:   32.00 ml  17.14 ml/m LA Vol (A4C):   60.0 ml 32.13 ml/m LA Biplane Vol: 50.8 ml 27.20 ml/m  AORTIC VALVE AV Area (Vmax):    1.10 cm AV Area (Vmean):   1.16 cm AV Area (VTI):     1.09 cm AV Vmax:           213.00 cm/s AV Vmean:          149.000 cm/s AV VTI:            0.477 m AV Peak Grad:  18.1 mmHg AV Mean Grad:      10.5 mmHg LVOT Vmax:         67.90 cm/s LVOT Vmean:        50.000 cm/s LVOT VTI:          0.150 m LVOT/AV VTI ratio: 0.31  AORTA Ao Root diam: 2.70 cm MITRAL VALVE               TRICUSPID VALVE MV Area (PHT): 2.68 cm    TR Peak grad:   42.0 mmHg MV Decel Time: 283 msec    TR Vmax:        324.00 cm/s MV E velocity: 63.80 cm/s MV A velocity: 89.50 cm/s  SHUNTS MV E/A ratio:  0.71         Systemic VTI:  0.15 m                            Systemic Diam: 2.10 cm Rozann Lesches MD Electronically signed by Rozann Lesches MD Signature Date/Time: 03/25/2021/2:24:21 PM    Final    Irwin Brakeman, MD  [facility]  If 7PM-7AM, please contact night-coverage [email or web address] Password [email or web address] 03/28/2021, 2:24 PM   LOS: 3 days

## 2021-03-28 NOTE — Transfer of Care (Signed)
Immediate Anesthesia Transfer of Care Note  Patient: [name]  Procedure(s) Performed: ENDOSCOPIC RETROGRADE CHOLANGIOPANCREATOGRAPHY (ERCP) (N/A ) SPHINCTEROTOMY REMOVAL OF STONES  Patient Location: PACU  Anesthesia Type:General  Level of Consciousness: awake, alert  and patient cooperative  Airway & Oxygen Therapy: Patient Spontanous Breathing  Post-op Assessment: Report given to RN and Post -op Vital signs reviewed and stable  Post vital signs: Reviewed and stable  Last Vitals:  Vitals Value Taken Time  BP 195/102 03/28/21 1413  Temp    Pulse 89 03/28/21 1414  Resp 17 03/28/21 1414  SpO2 97 % 03/28/21 1414  Vitals shown include unvalidated device data. SEE VITAL SIGN FLOW SHEET Last Pain:  Vitals:   03/28/21 1036  TempSrc: Oral  PainSc: 5       Patients Stated Pain Goal: 8 (37/85/88 5027)  Complications: No complications documented.

## 2021-03-28 NOTE — Progress Notes (Signed)
Subjective:  Patient complains of back pain as well as pain in right side of her abdomen.  She denies nausea vomiting chest pain or shortness of breath.  She is hungry.  Current Medications:  Current Facility-Administered Medications:  .  0.9 %  sodium chloride infusion, , Intravenous, Continuous, Montez Morita, Quillian Quince, MD, Last Rate: 20 mL/hr at 03/27/21 1536, New Bag at 03/27/21 1536 .  [MAR Hold] acetaminophen (TYLENOL) tablet 650 mg, 650 mg, Oral, Q6H PRN, Tat, David, MD, 650 mg at 03/26/21 2002 .  [MAR Hold] ascorbic acid (VITAMIN C) tablet 500 mg, 500 mg, Oral, Daily, Tat, David, MD, 500 mg at 03/27/21 0905 .  [MAR Hold] Gerhardt's butt cream, , Topical, Daily PRN, Tat, David, MD .  lactated ringers infusion, , Intravenous, Continuous, Battula, Rajamani C, MD, Last Rate: 50 mL/hr at 03/28/21 1045, 1,000 mL at 03/28/21 1045 .  [MAR Hold] metoprolol succinate (TOPROL-XL) 24 hr tablet 150 mg, 150 mg, Oral, Daily, Adefeso, Oladapo, DO, 150 mg at 03/27/21 0905 .  [MAR Hold] morphine 2 MG/ML injection 2 mg, 2 mg, Intravenous, Q4H PRN, Adefeso, Oladapo, DO, 2 mg at 03/27/21 1946 .  [MAR Hold] multivitamin with minerals tablet 1 tablet, 1 tablet, Oral, Daily, Tat, David, MD, 1 tablet at 03/27/21 0905 .  [MAR Hold] nitroGLYCERIN (NITROSTAT) SL tablet 0.4 mg, 0.4 mg, Sublingual, Q5 Min x 3 PRN, Adefeso, Oladapo, DO .  [MAR Hold] ondansetron (ZOFRAN) injection 4 mg, 4 mg, Intravenous, Q6H PRN, Adefeso, Oladapo, DO .  [MAR Hold] piperacillin-tazobactam (ZOSYN) IVPB 3.375 g, 3.375 g, Intravenous, Q8H, Adefeso, Oladapo, DO, Last Rate: 12.5 mL/hr at 03/28/21 0547, 3.375 g at 03/28/21 0547 .  [MAR Hold] pregabalin (LYRICA) capsule 50 mg, 50 mg, Oral, TID, Tat, David, MD, 50 mg at 03/27/21 2114 .  simethicone (MYLICON) 40 EZ/7.4JF suspension, , , ,  .  sodium chloride 0.9 % infusion, , , ,    Objective: Blood pressure (!) 161/83, pulse 80, temperature 98.6 F (37 C), temperature source Oral,  resp. rate 17, height 5' (1.524 m), weight 90.8 kg, SpO2 95 %. Patient is alert and in no acute distress. Conjunctiva is pink. Sclera is nonicteric Oropharyngeal mucosa is normal. No neck masses or thyromegaly noted. Cardiac exam with regular rhythm normal S1 and S2.  She has grade 2/6 systolic ejection murmur best at aortic area. Lungs are clear to auscultation. Abdomen is full.  She has umbilical/supraumbilical hernia.  She has intrathecal pump in the right abdomen.  On palpation abdomen is soft.  She has mild tenderness in epigastrium and right upper quadrant.  No organomegaly or masses. Nonpitting pretibial ankle edema noted.  Labs/studies Results:  CBC Latest Ref Rng & Units 03/28/2021 03/26/2021 03/25/2021  WBC 4.0 - 10.5 K/uL 4.9 4.0 4.7  Hemoglobin 12.0 - 15.0 g/dL 10.8(L) 10.6(L) 10.8(L)  Hematocrit 36.0 - 46.0 % 33.0(L) 33.1(L) 33.2(L)  Platelets 150 - 400 K/uL 189 182 202    CMP Latest Ref Rng & Units 03/28/2021 03/27/2021 03/26/2021  Glucose 70 - 99 mg/dL 97 95 90  BUN 8 - 23 mg/dL '9 13 18  ' Creatinine 0.44 - 1.00 mg/dL 0.67 0.76 0.85  Sodium 135 - 145 mmol/L 143 141 144  Potassium 3.5 - 5.1 mmol/L 4.0 3.5 3.6  Chloride 98 - 111 mmol/L 103 101 104  CO2 22 - 32 mmol/L 33(H) 30 33(H)  Calcium 8.9 - 10.3 mg/dL 9.7 9.3 9.4  Total Protein 6.5 - 8.1 g/dL 5.9(L) 5.6(L) 5.4(L)  Total Bilirubin 0.3 -  1.2 mg/dL 1.3(H) 1.4(H) 1.4(H)  Alkaline Phos 38 - 126 U/L 84 78 89  AST 15 - 41 U/L 56(H) 87(H) 177(H)  ALT 0 - 44 U/L 82(H) 117(H) 177(H)    Hepatic Function Latest Ref Rng & Units 03/28/2021 03/27/2021 03/26/2021  Total Protein 6.5 - 8.1 g/dL 5.9(L) 5.6(L) 5.4(L)  Albumin 3.5 - 5.0 g/dL 3.1(L) 3.0(L) 2.8(L)  AST 15 - 41 U/L 56(H) 87(H) 177(H)  ALT 0 - 44 U/L 82(H) 117(H) 177(H)  Alk Phosphatase 38 - 126 U/L 84 78 89  Total Bilirubin 0.3 - 1.2 mg/dL 1.3(H) 1.4(H) 1.4(H)  Bilirubin, Direct 0.0 - 0.2 mg/dL - - -     Assessment:  #1.  Choledocholithiasis.  Patient had elevated  transaminases and bilirubin on admission.  CT reveals multiple small stones in distal segment.  Patient is on on Zosyn.  ERCP delayed because patient has been on Plavix.  Last dose was 4 days ago.  #2.  Patient also has acute cholecystitis.  She is scheduled for laparoscopic cholecystectomy tomorrow.  #3.  Chronic low back pain secondary to compression fracture of L1 and L2 vertebrae.  She has intrathecal pump in place.  She has been told by pain management that they will have to tweak the dose few times.  She has an appointment on Apr 05, 2021.  #4.  Coronary artery disease.  #5.  Atrial fibrillation.  #6.  COPD.  Patient appears to be at baseline.   Plan:  ERCP with sphincterotomy and stone extraction. I have reviewed the procedures with the patient again in she is agreeable.

## 2021-03-28 NOTE — Progress Notes (Signed)
  Brief ERCP note  Normal limited view of upper GI tract. Normal ampulla of Vater. Cholangiogram reveals small filling defect in the distal bile duct. Biliary sphincterotomy performed and stones removed with stone balloon extractor. 3 pigment stones were removed. Pancreatic duct was not cannulated or filled with contrast. Minimal ooze noted.  It did not require any intervention. Patient tolerated the procedure well.

## 2021-03-28 NOTE — Anesthesia Procedure Notes (Signed)
Procedure Name: Intubation Date/Time: 03/28/2021 1:18 PM Performed by: Jonna Munro, CRNA Pre-anesthesia Checklist: Patient identified, Emergency Drugs available, Suction available, Patient being monitored and Timeout performed Patient Re-evaluated:Patient Re-evaluated prior to induction Oxygen Delivery Method: Circle system utilized Preoxygenation: Pre-oxygenation with 100% oxygen Induction Type: IV induction Laryngoscope Size: Mac and 3 Grade View: Grade II Tube type: Oral Number of attempts: 1 Airway Equipment and Method: Stylet Placement Confirmation: ETT inserted through vocal cords under direct vision,  positive ETCO2 and breath sounds checked- equal and bilateral Secured at: 23 cm Tube secured with: Tape Dental Injury: Teeth and Oropharynx as per pre-operative assessment

## 2021-03-28 NOTE — Progress Notes (Addendum)
[facility] Progress Note  Day of Surgery  Subjective: Saw patient in PACU and doing ok. Still sore and has some back pain she says. Awaiting ERCP.   Objective: Vital signs in last 24 hours: Temp:  [97.6 F (36.4 C)-98.6 F (37 C)] 98.6 F (37 C) (04/28 1036) Pulse Rate:  [80-85] 80 (04/28 0512) Resp:  [17-20] 17 (04/28 1036) BP: (152-174)/(65-90) 161/83 (04/28 1036) SpO2:  [90 %-97 %] 95 % (04/28 1036) Last BM Date: 03/24/21  Intake/Output from previous day: 04/27 0701 - 04/28 0700 In: 1197.6 [P.O.:840; I.V.:28; IV Piggyback:329.6] Out: 4000 [Urine:4000] Intake/Output this shift: No intake/output data recorded.  General appearance: alert, cooperative and no distress GI: soft, tender RUQ and epigastric region, some distention  Lab Results:  Recent Labs    03/26/21 0700 03/28/21 0430  WBC 4.0 4.9  HGB 10.6* 10.8*  HCT 33.1* 33.0*  PLT 182 189   BMET Recent Labs    03/27/21 0503 03/28/21 0430  NA 141 143  K 3.5 4.0  CL 101 103  CO2 30 33*  GLUCOSE 95 97  BUN 13 9  CREATININE 0.76 0.67  CALCIUM 9.3 9.7    Anti-infectives: Anti-infectives (From admission, onward)   Start     Dose/Rate Route Frequency Ordered Stop   03/25/21 0600  piperacillin-tazobactam (ZOSYN) IVPB 3.375 g  Status:  Discontinued        3.375 g 100 mL/hr over 30 Minutes Intravenous Every 6 hours 03/25/21 0321 03/25/21 0323   03/25/21 0600  [MAR Hold]  piperacillin-tazobactam (ZOSYN) IVPB 3.375 g        (MAR Hold since Thu 03/28/2021 at 1017.Hold Reason: Transfer to a Procedural area.)   3.375 g 12.5 mL/hr over 240 Minutes Intravenous Every 8 hours 03/25/21 0324     03/25/21 0130  cefTRIAXone (ROCEPHIN) 1 g in sodium chloride 0.9 % 100 mL IVPB        1 g 200 mL/hr over 30 Minutes Intravenous  Once 03/25/21 0118 03/25/21 0200      Assessment/Plan: Ms. Reitan is a 75 yo with acute cholecystitis and choledocholithiasis getting ERCP today. Has been on zosyn. Plan for lap  chole tomorrow pending no issues today. Plavix has been held. ECHO improved from prior and cardiology said acceptable risk.   PLAN: I counseled the patient about the indication, risks and benefits of laparoscopic cholecystectomy.  She understands there is a very small chance for bleeding, infection, injury to normal structures (including common bile duct), conversion to open surgery, persistent symptoms, evolution of postcholecystectomy diarrhea, need for secondary interventions, anesthesia reaction, cardiopulmonary issues and other risks not specifically detailed here. I described the expected recovery, the plan for follow-up and the restrictions during the recovery phase.  All questions were answered.    LOS: 3 days    [name] 03/28/2021

## 2021-03-29 ENCOUNTER — Inpatient Hospital Stay (HOSPITAL_COMMUNITY): Payer: Medicare Other | Admitting: Anesthesiology

## 2021-03-29 ENCOUNTER — Encounter (HOSPITAL_COMMUNITY): Payer: Self-pay | Admitting: Internal Medicine

## 2021-03-29 ENCOUNTER — Encounter (INDEPENDENT_AMBULATORY_CARE_PROVIDER_SITE_OTHER): Payer: Medicare Other | Admitting: General Surgery

## 2021-03-29 ENCOUNTER — Encounter (HOSPITAL_COMMUNITY)
Admission: EM | Disposition: A | Discharge status: Home Health | Payer: Self-pay | Source: Home / Self Care | Attending: Family Medicine

## 2021-03-29 DIAGNOSIS — K8001 Calculus of gallbladder with acute cholecystitis with obstruction: Secondary | ICD-10-CM | POA: Diagnosis not present

## 2021-03-29 DIAGNOSIS — K8050 Calculus of bile duct without cholangitis or cholecystitis without obstruction: Secondary | ICD-10-CM | POA: Diagnosis not present

## 2021-03-29 HISTORY — PX: CHOLECYSTECTOMY: SHX55

## 2021-03-29 LAB — CBC WITH DIFFERENTIAL/PLATELET
Abs Immature Granulocytes: 0.02 10*3/uL (ref 0.00–0.07)
Basophils Absolute: 0 10*3/uL (ref 0.0–0.1)
Basophils Relative: 0 %
Eosinophils Absolute: 0 10*3/uL (ref 0.0–0.5)
Eosinophils Relative: 0 %
HCT: 33.3 % — ABNORMAL LOW (ref 36.0–46.0)
Hemoglobin: 11 g/dL — ABNORMAL LOW (ref 12.0–15.0)
Immature Granulocytes: 1 %
Lymphocytes Relative: 31 %
Lymphs Abs: 1.3 10*3/uL (ref 0.7–4.0)
MCH: 32.6 pg (ref 26.0–34.0)
MCHC: 33 g/dL (ref 30.0–36.0)
MCV: 98.8 fL (ref 80.0–100.0)
Monocytes Absolute: 0.3 10*3/uL (ref 0.1–1.0)
Monocytes Relative: 6 %
Neutro Abs: 2.7 10*3/uL (ref 1.7–7.7)
Neutrophils Relative %: 62 %
Platelets: 195 10*3/uL (ref 150–400)
RBC: 3.37 MIL/uL — ABNORMAL LOW (ref 3.87–5.11)
RDW: 13.7 % (ref 11.5–15.5)
WBC: 4.3 10*3/uL (ref 4.0–10.5)
nRBC: 0 % (ref 0.0–0.2)

## 2021-03-29 LAB — COMPREHENSIVE METABOLIC PANEL
ALT: 65 U/L — ABNORMAL HIGH (ref 0–44)
AST: 48 U/L — ABNORMAL HIGH (ref 15–41)
Albumin: 3.3 g/dL — ABNORMAL LOW (ref 3.5–5.0)
Alkaline Phosphatase: 83 U/L (ref 38–126)
Anion gap: 11 (ref 5–15)
BUN: 15 mg/dL (ref 8–23)
CO2: 27 mmol/L (ref 22–32)
Calcium: 9.8 mg/dL (ref 8.9–10.3)
Chloride: 103 mmol/L (ref 98–111)
Creatinine, Ser: 0.85 mg/dL (ref 0.44–1.00)
GFR, Estimated: >60 mL/min (ref >60)
Glucose, Bld: 151 mg/dL — ABNORMAL HIGH (ref 70–99)
Potassium: 4.1 mmol/L (ref 3.5–5.1)
Sodium: 141 mmol/L (ref 135–145)
Total Bilirubin: 1 mg/dL (ref 0.3–1.2)
Total Protein: 6.1 g/dL — ABNORMAL LOW (ref 6.5–8.1)

## 2021-03-29 LAB — MAGNESIUM: Magnesium: 1.7 mg/dL (ref 1.7–2.4)

## 2021-03-29 LAB — SURGICAL PCR SCREEN
MRSA, PCR: POSITIVE — AB
Staphylococcus aureus: POSITIVE — AB

## 2021-03-29 SURGERY — LAPAROSCOPIC CHOLECYSTECTOMY
Anesthesia: General | Site: Abdomen

## 2021-03-29 MED ORDER — PROPOFOL 10 MG/ML IV BOLUS
INTRAVENOUS | Status: DC | PRN
  Administered 2021-03-29: 150 mg via INTRAVENOUS

## 2021-03-29 MED ORDER — LABETALOL HCL 5 MG/ML IV SOLN
INTRAVENOUS | Status: AC
  Filled 2021-03-29: qty 4

## 2021-03-29 MED ORDER — CHLORHEXIDINE GLUCONATE 0.12 % MT SOLN
15.0000 mL | Freq: Once | OROMUCOSAL | Status: AC
  Administered 2021-03-29: 15 mL via OROMUCOSAL

## 2021-03-29 MED ORDER — SUGAMMADEX SODIUM 200 MG/2ML IV SOLN
INTRAVENOUS | Status: DC | PRN
  Administered 2021-03-29: 200 mg via INTRAVENOUS

## 2021-03-29 MED ORDER — SUCCINYLCHOLINE CHLORIDE 200 MG/10ML IV SOSY
PREFILLED_SYRINGE | INTRAVENOUS | Status: DC | PRN
  Administered 2021-03-29: 100 mg via INTRAVENOUS

## 2021-03-29 MED ORDER — FENTANYL CITRATE (PF) 100 MCG/2ML IJ SOLN
INTRAMUSCULAR | Status: AC
  Filled 2021-03-29: qty 2

## 2021-03-29 MED ORDER — PIPERACILLIN-TAZOBACTAM 3.375 G IVPB
3.3750 g | Freq: Three times a day (TID) | INTRAVENOUS | Status: AC
  Administered 2021-03-29 – 2021-03-30 (×2): 3.375 g via INTRAVENOUS
  Filled 2021-03-29 (×2): qty 50

## 2021-03-29 MED ORDER — FENTANYL CITRATE (PF) 100 MCG/2ML IJ SOLN
INTRAMUSCULAR | Status: AC
  Administered 2021-03-29: 50 ug via INTRAVENOUS
  Filled 2021-03-29: qty 2

## 2021-03-29 MED ORDER — MORPHINE SULFATE (PF) 2 MG/ML IV SOLN: 2.0000 mg | INTRAVENOUS | Status: DC | PRN

## 2021-03-29 MED ORDER — METOPROLOL TARTRATE 5 MG/5ML IV SOLN
INTRAVENOUS | Status: AC
  Filled 2021-03-29: qty 5

## 2021-03-29 MED ORDER — OXYCODONE HCL 5 MG PO TABS: 5.0000 mg | ORAL_TABLET | ORAL | Status: DC | PRN

## 2021-03-29 MED ORDER — ROCURONIUM BROMIDE 100 MG/10ML IV SOLN
INTRAVENOUS | Status: DC | PRN
  Administered 2021-03-29: 40 mg via INTRAVENOUS

## 2021-03-29 MED ORDER — ONDANSETRON HCL 4 MG/2ML IJ SOLN
INTRAMUSCULAR | Status: DC | PRN
  Administered 2021-03-29: 4 mg via INTRAVENOUS

## 2021-03-29 MED ORDER — LACTATED RINGERS IV SOLN: INTRAVENOUS | Status: DC

## 2021-03-29 MED ORDER — BUPIVACAINE HCL (PF) 0.5 % IJ SOLN
INTRAMUSCULAR | Status: DC | PRN
  Administered 2021-03-29: 30 mL

## 2021-03-29 MED ORDER — LABETALOL HCL 5 MG/ML IV SOLN
10.0000 mg | Freq: Once | INTRAVENOUS | Status: AC
  Administered 2021-03-29: 10 mg via INTRAVENOUS

## 2021-03-29 MED ORDER — HYDROMORPHONE HCL 1 MG/ML IJ SOLN
0.2500 mg | INTRAMUSCULAR | Status: DC | PRN
  Administered 2021-03-29 (×3): 0.5 mg via INTRAVENOUS
  Filled 2021-03-29 (×3): qty 0.5

## 2021-03-29 MED ORDER — SODIUM CHLORIDE 0.9 % IR SOLN
Status: DC | PRN
  Administered 2021-03-29: 1000 mL

## 2021-03-29 MED ORDER — CHLORHEXIDINE GLUCONATE 0.12 % MT SOLN
OROMUCOSAL | Status: AC
  Filled 2021-03-29: qty 15

## 2021-03-29 MED ORDER — LIDOCAINE HCL (CARDIAC) PF 100 MG/5ML IV SOSY
PREFILLED_SYRINGE | INTRAVENOUS | Status: DC | PRN
  Administered 2021-03-29: 80 mg via INTRATRACHEAL

## 2021-03-29 MED ORDER — ORAL CARE MOUTH RINSE: 15.0000 mL | Freq: Once | OROMUCOSAL | Status: AC

## 2021-03-29 MED ORDER — FENTANYL CITRATE (PF) 100 MCG/2ML IJ SOLN
INTRAMUSCULAR | Status: DC | PRN
  Administered 2021-03-29 (×4): 50 ug via INTRAVENOUS

## 2021-03-29 MED ORDER — SODIUM CHLORIDE 0.9 % IV SOLN
INTRAVENOUS | Status: AC
  Filled 2021-03-29: qty 2

## 2021-03-29 MED ORDER — HEMOSTATIC AGENTS (NO CHARGE) OPTIME
TOPICAL | Status: DC | PRN
Start: 2021-03-29 — End: 2021-03-29
  Administered 2021-03-29 (×2): 1 via TOPICAL

## 2021-03-29 MED ORDER — FENTANYL CITRATE (PF) 100 MCG/2ML IJ SOLN
50.0000 ug | Freq: Once | INTRAMUSCULAR | Status: AC
Start: 2021-03-29 — End: 2021-03-29

## 2021-03-29 MED ORDER — BUPIVACAINE HCL (PF) 0.5 % IJ SOLN
INTRAMUSCULAR | Status: AC
  Filled 2021-03-29: qty 30

## 2021-03-29 MED ORDER — SEVOFLURANE IN SOLN
RESPIRATORY_TRACT | Status: AC
  Filled 2021-03-29: qty 250

## 2021-03-29 MED ORDER — DOCUSATE SODIUM 100 MG PO CAPS
100.0000 mg | ORAL_CAPSULE | Freq: Two times a day (BID) | ORAL | Status: DC
  Administered 2021-03-30 – 2021-04-05 (×11): 100 mg via ORAL
  Filled 2021-03-29 (×12): qty 1

## 2021-03-29 MED ORDER — METOPROLOL TARTRATE 5 MG/5ML IV SOLN
INTRAVENOUS | Status: DC | PRN
  Administered 2021-03-29: 3 mg via INTRAVENOUS

## 2021-03-29 SURGICAL SUPPLY — 41 items
APPLICATOR ARISTA FLEXITIP XL (MISCELLANEOUS) ×1 IMPLANT
APPLIER CLIP ROT 10 11.4 M/L (STAPLE) ×2
BAG RETRIEVAL 10 (BASKET) ×1
BLADE SURG 15 STRL LF DISP TIS (BLADE) ×1 IMPLANT
BLADE SURG 15 STRL SS (BLADE) ×1
CHLORAPREP W/TINT 26 (MISCELLANEOUS) ×2 IMPLANT
CLIP APPLIE ROT 10 11.4 M/L (STAPLE) ×1 IMPLANT
CLOTH BEACON ORANGE TIMEOUT ST (SAFETY) ×2 IMPLANT
COVER LIGHT HANDLE STERIS (MISCELLANEOUS) ×4 IMPLANT
COVER WAND RF STERILE (DRAPES) ×2 IMPLANT
DECANTER SPIKE VIAL GLASS SM (MISCELLANEOUS) ×2 IMPLANT
DERMABOND ADVANCED (GAUZE/BANDAGES/DRESSINGS) ×1
DERMABOND ADVANCED .7 DNX12 (GAUZE/BANDAGES/DRESSINGS) ×1 IMPLANT
ELECT REM PT RETURN 9FT ADLT (ELECTROSURGICAL) ×2
ELECTRODE REM PT RTRN 9FT ADLT (ELECTROSURGICAL) ×1 IMPLANT
GLOVE SURG ENC MOIS LTX SZ6.5 (GLOVE) ×2 IMPLANT
GLOVE SURG UNDER POLY LF SZ6.5 (GLOVE) ×2 IMPLANT
GLOVE SURG UNDER POLY LF SZ7 (GLOVE) ×6 IMPLANT
GOWN STRL REUS W/TWL LRG LVL3 (GOWN DISPOSABLE) ×6 IMPLANT
HEMOSTAT ARISTA ABSORB 3G PWDR (HEMOSTASIS) ×1 IMPLANT
HEMOSTAT SNOW SURGICEL 2X4 (HEMOSTASIS) ×2 IMPLANT
INST SET LAPROSCOPIC AP (KITS) ×2 IMPLANT
KIT TURNOVER KIT A (KITS) ×2 IMPLANT
MANIFOLD NEPTUNE II (INSTRUMENTS) ×2 IMPLANT
NDL INSUFFLATION 14GA 120MM (NEEDLE) ×1 IMPLANT
NEEDLE INSUFFLATION 14GA 120MM (NEEDLE) ×2 IMPLANT
NS IRRIG 1000ML POUR BTL (IV SOLUTION) ×2 IMPLANT
PACK LAP CHOLE LZT030E (CUSTOM PROCEDURE TRAY) ×2 IMPLANT
PAD ARMBOARD 7.5X6 YLW CONV (MISCELLANEOUS) ×3 IMPLANT
SET BASIN LINEN APH (SET/KITS/TRAYS/PACK) ×2 IMPLANT
SET TUBE SMOKE EVAC HIGH FLOW (TUBING) ×2 IMPLANT
SLEEVE ENDOPATH XCEL 5M (ENDOMECHANICALS) ×2 IMPLANT
SUT MNCRL AB 4-0 PS2 18 (SUTURE) ×4 IMPLANT
SUT VICRYL 0 UR6 27IN ABS (SUTURE) ×2 IMPLANT
SYS BAG RETRIEVAL 10MM (BASKET) ×1
SYSTEM BAG RETRIEVAL 10MM (BASKET) ×1 IMPLANT
TROCAR ENDO BLADELESS 11MM (ENDOMECHANICALS) ×2 IMPLANT
TROCAR XCEL NON-BLD 5MMX100MML (ENDOMECHANICALS) ×2 IMPLANT
TROCAR XCEL UNIV SLVE 11M 100M (ENDOMECHANICALS) ×2 IMPLANT
TUBE CONNECTING 12X1/4 (SUCTIONS) ×2 IMPLANT
WARMER LAPAROSCOPE (MISCELLANEOUS) ×2 IMPLANT

## 2021-03-29 NOTE — Progress Notes (Signed)
Patient is resting quietly with eyes closed. This shift she has been confused. She stated she saw bugs on the ceiling and that baby shrimp were growing in her IV fluid bags.

## 2021-03-29 NOTE — Telephone Encounter (Signed)
Accidentally opened encounter in error.

## 2021-03-29 NOTE — Progress Notes (Signed)
PROGRESS NOTE  Stacey [name] [id number] DOB: 1946/07/04 DOA: 03/24/2021 PCP: Sela Hilding   Brief History:  75 year old female with a history of coronary artery disease status post DES to the RCA December [id number], systolic & diastolic CHF, COPD, hypertension, metastatic breast cancer, hyperlipidemia, PSVT, chronic pain syndrome status post intrathecal pump placement presenting with abdominal pain that began on 03/24/2021 that was severe rating 10/10 diffuse in nature.  The patient has subjective fevers and chills.  She had nausea without emesis.  She denies any diarrhea.  She denies any headache, cough, hemoptysis, chest pain, worsening shortness of breath, hematochezia, melena, dysuria, hematuria. In the emergency department, the patient was afebrile hemodynamically stable with oxygen saturation 100% on 3 L.  CT of the abdomen and pelvis showed gallbladder wall thickening and edema with infiltration into the pericholecystic fat.  There was extrahepatic ductal dilatation with suggestion of stones in the distal CBD and moderate intrahepatic biliary ductal dilatation.  There was infiltration and thickening around the proximal duodenum probably referred from the gallbladder.  The patient was started on Zosyn.  GI and general surgery were consulted to assist.  Assessment/Plan: Acute cholecystitis -General surgery consult appreciated-->plan lap chole 4/29 with Dr. Constance Haw -Continue IV Zosyn -Judicious IV fluids -Judicious opioids for pain control  Transaminasemia with hyperbilirubinemia -GI consult appreciated -ERCP planned 03/28/21 (allowing plavix washout) -due to cholecystitis/choledocholithiasis -ERCP was successful with 3 pigmented stones removed  Chronic systolic and diastolic CHF -Appears clinically euvolemic -04/18/2020 echo EF 35-40%, G1DD, mild to moderate AAS, mild MR -Continue metoprolol succinate -03/25/21 Echo EF 50-55%, G1DD, no WMA, trivial MR  Chronic  respiratory failure with hypoxia -Patient is normally on 1 L nasal cannula at nighttime -Monitor clinically -Incentive spirometry  Coronary artery disease -No chest pain presently -aspirin temporarily on hold, hopefully can resume soon after surgery -Continue metoprolol succinate  Chronic pain syndrome -Patient has intrathecal morphine pump infusing 5.1 mg per 24-hour. -Continue home dose oxycodone and Lyrica  Metastatic breast cancer to the bone -02/19/2021 bone scan--negative for bone metastasis -Outpatient follow-up with Dr. Delton Coombes  COPD -Stable without exacerbation  Chronic L1/L2 compression fracture -Continue home opioid regimen  Essential hypertension -Continue metoprolol succinate   Hyperlipidemia -Holding Lipitor secondary to elevated LFTs initially  Hypokalemia -Magnesium stable around 1.7  Macrocytic anemia -Check B12 and folate -baseline Hgb 11-12  Morbid Obesity -BMI 39.09 -lifestyle modification  Status is: Inpatient  Appropriate for inpatient due to severity of illness  Dispo: The patient is from: Home  Anticipated d/c is to: Home  Patient currently is not medically stable to d/c.              Difficult to place patient No  Family Communication:  son present at bedside  Consultants:  GI, general surgery  Code Status:  FULL   DVT Prophylaxis:  SCDs   Procedures: As Listed in Progress Note Above  Antibiotics: Zosyn 4/25>>  Subjective: Patient very anxious this morning prior to surgery.  No complaints today.    Objective: Vitals:   03/29/21 0451 03/29/21 0822 03/29/21 1007 03/29/21 1009  BP: (!) 139/93 (!) 155/57  (!) 146/97  Pulse: (!) 106 93 86   Resp:   18   Temp: 98.2 F (36.8 C)  98.1 F (36.7 C)   TempSrc: Oral  Oral   SpO2: 98%  97%   Weight:      Height:        Intake/Output Summary (  Last 24 hours) at 03/29/2021 1125 Last data filed at 03/28/2021 1901 Gross per 24  hour  Intake 700 ml  Output 300 ml  Net 400 ml   Weight change:  Exam:   General:  Pt is alert, follows commands appropriately, not in acute distress  HEENT: No icterus, No thrush, No neck mass, [place]/AT  Cardiovascular: irregularly irregular S1/S2, no rubs, no gallops  Respiratory: No increased work of breathing  Abdomen: persistent epigastric and RUQ  Tenderness with murphy sign, no guarding  Extremities: No edema, No lymphangitis, No petechiae, No rashes, no synovitis  Data Reviewed: I have personally reviewed following labs and imaging studies Basic Metabolic Panel: Recent Labs  Lab 03/25/21 0455 03/26/21 0700 03/27/21 0503 03/28/21 0430 03/29/21 0432  NA 142 144 141 143 141  K 3.6 3.6 3.5 4.0 4.1  CL 101 104 101 103 103  CO2 32 33* 30 33* 27  GLUCOSE 134* 90 95 97 151*  BUN 22 18 13 9 15   CREATININE 0.76 0.85 0.76 0.67 0.85  CALCIUM 9.4 9.4 9.3 9.7 9.8  MG 1.7 1.9 1.7 1.7 1.7  PHOS 4.6  --   --   --   --    Liver Function Tests: Recent Labs  Lab 03/25/21 0455 03/26/21 0700 03/27/21 0503 03/28/21 0430 03/29/21 0432  AST 517* 177* 87* 56* 48*  ALT 259* 177* 117* 82* 65*  ALKPHOS 99 89 78 84 83  BILITOT 1.6* 1.4* 1.4* 1.3* 1.0  PROT 5.4* 5.4* 5.6* 5.9* 6.1*  ALBUMIN 2.9* 2.8* 3.0* 3.1* 3.3*   Recent Labs  Lab 03/24/21 2201  LIPASE 20   No results for input(s): AMMONIA in the last 168 hours. Coagulation Profile: Recent Labs  Lab 03/25/21 0455  INR 1.0   CBC: Recent Labs  Lab 03/24/21 2201 03/25/21 0455 03/26/21 0700 03/28/21 0430 03/29/21 0432  WBC 11.3* 4.7 4.0 4.9 4.3  NEUTROABS 8.9*  --   --  2.4 2.7  HGB 12.6 10.8* 10.6* 10.8* 11.0*  HCT 37.0 33.2* 33.1* 33.0* 33.3*  MCV 95.9 100.6* 101.8* 98.8 98.8  PLT 261 202 182 189 195   Cardiac Enzymes: No results for input(s): CKTOTAL, CKMB, CKMBINDEX, TROPONINI in the last 168 hours. BNP: Invalid input(s): POCBNP CBG: Recent Labs  Lab 03/28/21 1420  GLUCAP 160*   HbA1C: No  results for input(s): HGBA1C in the last 72 hours. Urine analysis:    Component Value Date/Time   COLORURINE AMBER (A) 03/24/2021 2201   APPEARANCEUR CLOUDY (A) 03/24/2021 2201   LABSPEC 1.038 (H) 03/24/2021 2201   PHURINE 5.0 03/24/2021 2201   GLUCOSEU 50 (A) 03/24/2021 2201   HGBUR NEGATIVE 03/24/2021 2201   BILIRUBINUR NEGATIVE 03/24/2021 2201   KETONESUR 5 (A) 03/24/2021 2201   PROTEINUR 100 (A) 03/24/2021 2201   NITRITE NEGATIVE 03/24/2021 2201   LEUKOCYTESUR NEGATIVE 03/24/2021 2201   Sepsis Labs:  Recent Results (from the past 240 hour(s))  Resp Panel by RT-PCR (Flu A&B, Covid) Nasopharyngeal Swab     Status: None   Collection Time: 03/25/21  1:48 AM   Specimen: Nasopharyngeal Swab; Nasopharyngeal(NP) swabs in vial transport medium  Result Value Ref Range Status   SARS Coronavirus 2 by RT PCR NEGATIVE NEGATIVE Final    Comment: (NOTE) SARS-CoV-2 target nucleic acids are NOT DETECTED.  The SARS-CoV-2 RNA is generally detectable in upper respiratory specimens during the acute phase of infection. The lowest concentration of SARS-CoV-2 viral copies this assay can detect is 138 copies/mL. A negative result  does not preclude SARS-Cov-2 infection and should not be used as the sole basis for treatment or other patient management decisions. A negative result may occur with  improper specimen collection/handling, submission of specimen other than nasopharyngeal swab, presence of viral mutation(s) within the areas targeted by this assay, and inadequate number of viral copies(<138 copies/mL). A negative result must be combined with clinical observations, patient history, and epidemiological information. The expected result is Negative.  Fact Sheet for Patients:  EntrepreneurPulse.com.au  Fact Sheet for Healthcare Providers:  IncredibleEmployment.be  This test is no t yet approved or cleared by the [place] FDA and  has been authorized  for detection and/or diagnosis of SARS-CoV-2 by FDA under an Emergency Use Authorization (EUA). This EUA will remain  in effect (meaning this test can be used) for the duration of the COVID-19 declaration under Section 564(b)(1) of the Act, 21 U.S.C.section 360bbb-3(b)(1), unless the authorization is terminated  or revoked sooner.       Influenza A by PCR NEGATIVE NEGATIVE Final   Influenza B by PCR NEGATIVE NEGATIVE Final    Comment: (NOTE) The Xpert Xpress SARS-CoV-2/FLU/RSV plus assay is intended as an aid in the diagnosis of influenza from Nasopharyngeal swab specimens and should not be used as a sole basis for treatment. Nasal washings and aspirates are unacceptable for Xpert Xpress SARS-CoV-2/FLU/RSV testing.  Fact Sheet for Patients: EntrepreneurPulse.com.au  Fact Sheet for Healthcare Providers: IncredibleEmployment.be  This test is not yet approved or cleared by the [place] FDA and has been authorized for detection and/or diagnosis of SARS-CoV-2 by FDA under an Emergency Use Authorization (EUA). This EUA will remain in effect (meaning this test can be used) for the duration of the COVID-19 declaration under Section 564(b)(1) of the Act, 21 U.S.C. section 360bbb-3(b)(1), unless the authorization is terminated or revoked.  Performed at [facility], [address], [place], [place] [address]   Surgical pcr screen     Status: Abnormal   Collection Time: 03/29/21  8:41 AM   Specimen: Nasal Mucosa; Nasal Swab  Result Value Ref Range Status   MRSA, PCR POSITIVE (A) NEGATIVE Final    Comment: RESULT CALLED TO, READ BACK BY AND VERIFIED WITH: TORI RN @ 1118 ON [id number] BY HENDERSON L.    Staphylococcus aureus POSITIVE (A) NEGATIVE Final    Comment: RESULT CALLED TO, READ BACK BY AND VERIFIED WITH: TORI RN @ 8756 ON [id number] BY HENDERSON L. (NOTE) The Xpert SA Assay (FDA approved for NASAL specimens in patients 3 years of age and  older), is one component of a comprehensive surveillance program. It is not intended to diagnose infection nor to guide or monitor treatment. Performed at [facility], [address], [place], [place] [address]      Scheduled Meds: . [MAR Hold] vitamin C  500 mg Oral Daily  . chlorhexidine      . [MAR Hold] metoprolol succinate  150 mg Oral Daily  . [MAR Hold] multivitamin with minerals  1 tablet Oral Daily  . [MAR Hold] pregabalin  50 mg Oral TID   Continuous Infusions: . lactated ringers 50 mL/hr at 03/29/21 1023  . [MAR Hold] piperacillin-tazobactam (ZOSYN)  IV 3.375 g (03/29/21 0452)  . cefoTEtan (CEFOTAN) 2 GM IVPB (Mini-Bag Plus)      Procedures/Studies: CT ABDOMEN PELVIS W CONTRAST  Result Date: 03/25/2021 CLINICAL DATA:  Acute abdominal pain. History of MI, hypertension, congestive heart failure, breast cancer. EXAM: CT ABDOMEN AND PELVIS WITH CONTRAST TECHNIQUE: Multidetector CT imaging  of the abdomen and pelvis was performed using the standard protocol following bolus administration of intravenous contrast. CONTRAST:  129mL OMNIPAQUE IOHEXOL 300 MG/ML  SOLN COMPARISON:  02/19/2021 FINDINGS: Lower chest: Lung bases are clear. Hepatobiliary: Gallbladder wall thickening and edema with infiltration into the pericholecystic fat. Small stones layering in the gallbladder. Extrahepatic bile duct dilatation with suggestion of stones in the distal common bile duct. Moderate intrahepatic bile duct dilatation. No focal liver lesions. Small amount of perihepatic ascites. Pancreas: Unremarkable. No pancreatic ductal dilatation or surrounding inflammatory changes. Spleen: Normal in size without focal abnormality. Adrenals/Urinary Tract: No adrenal gland nodules. Complex hypoenhancing lesion demonstrated in the left kidney measuring 1.9 cm diameter. This may represent a renal cell carcinoma. Similar appearance to previous study. No hydronephrosis or hydroureter. Bladder wall is thickened, probably  due to under distention although cystitis could also have this appearance. Stomach/Bowel: Stomach, small bowel, and colon are not abnormally distended. Scattered colonic diverticula without evidence of diverticulitis. Infiltration and wall thickening around the proximal duodenum probably is referred inflammation from the gallbladder. Duodenitis could also be present. Appendix is not identified. Vascular/Lymphatic: Aortic atherosclerosis. No enlarged abdominal or pelvic lymph nodes. Reproductive: Uterus is surgically absent. No abnormal adnexal masses. Other: No free air in the abdomen. Moderate-sized periumbilical hernia containing fat. Musculoskeletal: Degenerative changes in the spine. Lumbar scoliosis convex towards the left. Sclerotic bone lesions consistent with metastatic disease and vertebral compression of L2. IMPRESSION: 1. Cholelithiasis with changes of acute cholecystitis. Bile duct dilatation with probable stones in the distal common bile duct. 2. Infiltration and wall thickening around the proximal duodenum probably is referred inflammation from the gallbladder. Duodenitis could also have this appearance. 3. Complex hypoenhancing lesion in the left kidney may represent a renal cell carcinoma. No change since prior study. 4. Sclerotic bone lesions consistent with metastatic disease. Compression of L2. 5. Moderate-sized periumbilical hernia containing fat. 6. Aortic atherosclerosis. Aortic Atherosclerosis (ICD10-I70.0). Electronically Signed   By: Lucienne Capers M.D.   On: 03/25/2021 00:50   DG ERCP BILIARY & PANCREATIC DUCTS  Result Date: 03/28/2021 CLINICAL DATA:  Choledocholithiasis EXAM: ERCP TECHNIQUE: Multiple spot images obtained with the fluoroscopic device and submitted for interpretation post-procedure. COMPARISON:  CT abdomen pelvis-03/25/2021 FLUOROSCOPY TIME:  2 minutes, 2 seconds (39.4 mGy) FINDINGS: Multiple spot fluoroscopic images the right upper abdominal quadrant during ERCP are  provided for review. Initial image demonstrates an ERCP probe overlying the right upper abdominal quadrant. Subsequent images demonstrate selective cannulation opacification of the common duct which appears moderately dilated. There are several transient nonocclusive filling defects within the CBD which could represent air bubbles versus nonocclusive choledocholithiasis. Subsequent images demonstrate insufflation of a balloon within the central aspect of the CBD with subsequent biliary sweeping and presumed stone extraction and sphincterotomy There is minimal opacification of the central aspect of the intrahepatic biliary tree which appears mildly dilated. There is faint opacification of the gallbladder lumen. There is no definitive opacification of the pancreatic duct. IMPRESSION: ERCP with biliary sweeping and presumed stone extraction and sphincterotomy as above. These images were submitted for radiologic interpretation only. Please see the procedural report for the amount of contrast and the fluoroscopy time utilized. Electronically Signed   By: Sandi Mariscal M.D.   On: 03/28/2021 14:23   ECHOCARDIOGRAM COMPLETE  Result Date: 03/25/2021    ECHOCARDIOGRAM REPORT   Patient Name:   CATRINIA RIGGINS Date of Exam: 03/25/2021 Medical Rec #:  [id number]         Height:  60.0 in Accession #:    [id number]        Weight:       200.2 lb Date of Birth:  15-Jul-1946          BSA:          1.867 m Patient Age:    64 years          BP:           141/72 mmHg Patient Gender: F                 HR:           71 bpm. Exam Location:  [facility] Procedure: 2D Echo, Cardiac Doppler and Color Doppler Indications:    CHF  History:        Patient has prior history of Echocardiogram examinations, most                 recent 04/18/2020. CHF, CAD, COPD, Aortic Valve Disease and                 Aortic stenosis; Risk Factors:Hypertension, Dyslipidemia and                 morbid obestiy. Metastatic breast cancer.  Sonographer:    Dustin Flock RDCS Referring Phys: [phone] DAVID TAT IMPRESSIONS  1. Left ventricular ejection fraction, by estimation, is 50 to 55%. The left ventricle has low normal function. The left ventricle has no regional wall motion abnormalities. Left ventricular diastolic parameters are consistent with Grade I diastolic dysfunction (impaired relaxation).  2. Right ventricular systolic function is normal. The right ventricular size is normal. There is mildly elevated pulmonary artery systolic pressure. The estimated right ventricular systolic pressure is 76.1 mmHg.  3. Left atrial size was upper normal.  4. The mitral valve is grossly normal. Trivial mitral valve regurgitation.  5. The aortic valve is tricuspid with some fusion of left and noncoronary cusps. There is moderate calcification of the aortic valve. Aortic valve regurgitation is not visualized. Moderate aortic valve stenosis. Aortic valve mean gradient measures 10.5 mmHg. Dimentionless index 0.31.  6. The inferior vena cava is normal in size with greater than 50% respiratory variability, suggesting right atrial pressure of 3 mmHg. FINDINGS  Left Ventricle: Left ventricular ejection fraction, by estimation, is 50 to 55%. The left ventricle has low normal function. The left ventricle has no regional wall motion abnormalities. The left ventricular internal cavity size was normal in size. There is borderline left ventricular hypertrophy. Abnormal (paradoxical) septal motion, consistent with left bundle branch block. Left ventricular diastolic parameters are consistent with Grade I diastolic dysfunction (impaired relaxation). Right Ventricle: The right ventricular size is normal. No increase in right ventricular wall thickness. Right ventricular systolic function is normal. There is mildly elevated pulmonary artery systolic pressure. The tricuspid regurgitant velocity is 3.24  m/s, and with an assumed right atrial pressure of 3 mmHg, the estimated right ventricular  systolic pressure is 60.7 mmHg. Left Atrium: Left atrial size was upper normal. Right Atrium: Right atrial size was normal in size. Pericardium: There is no evidence of pericardial effusion. Mitral Valve: The mitral valve is grossly normal. Mild mitral annular calcification. Trivial mitral valve regurgitation. Tricuspid Valve: The tricuspid valve is grossly normal. Tricuspid valve regurgitation is mild. Aortic Valve: The aortic valve is tricuspid. There is moderate calcification of the aortic valve. There is moderate aortic valve annular calcification. Aortic valve regurgitation is not visualized. Moderate aortic stenosis is present.  Aortic valve mean gradient measures 10.5 mmHg. Aortic valve peak gradient measures 18.1 mmHg. Aortic valve area, by VTI measures 1.09 cm. Pulmonic Valve: The pulmonic valve was grossly normal. Pulmonic valve regurgitation is trivial. Aorta: The aortic root is normal in size and structure. Venous: The inferior vena cava is normal in size with greater than 50% respiratory variability, suggesting right atrial pressure of 3 mmHg. IAS/Shunts: No atrial level shunt detected by color flow Doppler.  LEFT VENTRICLE PLAX 2D LVIDd:         4.35 cm  Diastology LVIDs:         3.18 cm  LV e' medial:    5.44 cm/s LV PW:         1.05 cm  LV E/e' medial:  11.7 LV IVS:        1.08 cm  LV e' lateral:   7.40 cm/s LVOT diam:     2.10 cm  LV E/e' lateral: 8.6 LV SV:         52 LV SV Index:   28 LVOT Area:     3.46 cm  RIGHT VENTRICLE RV Basal diam:  3.00 cm RV S prime:     7.07 cm/s TAPSE (M-mode): 2.1 cm LEFT ATRIUM             Index       RIGHT ATRIUM           Index LA diam:        3.80 cm 2.04 cm/m  RA Area:     11.70 cm LA Vol (A2C):   37.5 ml 20.08 ml/m RA Volume:   32.00 ml  17.14 ml/m LA Vol (A4C):   60.0 ml 32.13 ml/m LA Biplane Vol: 50.8 ml 27.20 ml/m  AORTIC VALVE AV Area (Vmax):    1.10 cm AV Area (Vmean):   1.16 cm AV Area (VTI):     1.09 cm AV Vmax:           213.00 cm/s AV Vmean:           149.000 cm/s AV VTI:            0.477 m AV Peak Grad:      18.1 mmHg AV Mean Grad:      10.5 mmHg LVOT Vmax:         67.90 cm/s LVOT Vmean:        50.000 cm/s LVOT VTI:          0.150 m LVOT/AV VTI ratio: 0.31  AORTA Ao Root diam: 2.70 cm MITRAL VALVE               TRICUSPID VALVE MV Area (PHT): 2.68 cm    TR Peak grad:   42.0 mmHg MV Decel Time: 283 msec    TR Vmax:        324.00 cm/s MV E velocity: 63.80 cm/s MV A velocity: 89.50 cm/s  SHUNTS MV E/A ratio:  0.71        Systemic VTI:  0.15 m                            Systemic Diam: 2.10 cm Rozann Lesches MD Electronically signed by Rozann Lesches MD Signature Date/Time: 03/25/2021/2:24:21 PM    Final    Irwin Brakeman, MD  [facility]  If 7PM-7AM, please contact night-coverage [email or web address] Password [email or web address] 03/29/2021, 11:25 AM   LOS: 4 days

## 2021-03-29 NOTE — Progress Notes (Signed)
This morning when giving medicines, patient was pointing at the ceiling stating there are bugs on the ceiling and wish the tiles would not move. Patient was made aware that their were no bugs on the ceiling.

## 2021-03-29 NOTE — Transfer of Care (Signed)
Immediate Anesthesia Transfer of Care Note  Patient: [name]  Procedure(s) Performed: LAPAROSCOPIC CHOLECYSTECTOMY (N/A Abdomen)  Patient Location: PACU  Anesthesia Type:General  Level of Consciousness: awake  Airway & Oxygen Therapy: Patient Spontanous Breathing and Patient connected to face mask oxygen  Post-op Assessment: Report given to RN and Post -op Vital signs reviewed and stable  Post vital signs: Reviewed and stable  Last Vitals:  Vitals Value Taken Time  BP 201/99 03/29/21 1228  Temp 37.3 C 03/29/21 1221  Pulse 80 03/29/21 1230  Resp 11 03/29/21 1230  SpO2 100 % 03/29/21 1230  Vitals shown include unvalidated device data.  Last Pain:  Vitals:   03/29/21 1221  TempSrc:   PainSc: 10-Worst pain ever      Patients Stated Pain Goal: 8 (77/93/90 3009)  Complications: No complications documented.

## 2021-03-29 NOTE — Anesthesia Postprocedure Evaluation (Signed)
Anesthesia Post Note  Patient: Stacey [name]  Procedure(s) Performed: LAPAROSCOPIC CHOLECYSTECTOMY (N/A Abdomen)  Patient location during evaluation: PACU Anesthesia Type: General Level of consciousness: awake and alert and oriented Pain management: pain level controlled Vital Signs Assessment: post-procedure vital signs reviewed and stable Respiratory status: spontaneous breathing and respiratory function stable Cardiovascular status: stable and blood pressure returned to baseline Postop Assessment: no apparent nausea or vomiting Anesthetic complications: no   No complications documented.   Last Vitals:  Vitals:   03/29/21 1500 03/29/21 1509  BP: 129/72 137/84  Pulse: 80 74  Resp: 16 15  Temp: 36.5 C (!) 36.4 C  SpO2: 99% 100%    Last Pain:  Vitals:   03/29/21 1500  TempSrc:   PainSc: 5                  Pamela Intrieri C Tonee Silverstein

## 2021-03-29 NOTE — Progress Notes (Signed)
PT Cancellation Note  Patient Details Name: JANNETH KRASNER MRN: [id number] DOB: [birth year]   Cancelled Treatment:    Reason Eval/Treat Not Completed: Patient at procedure or test/unavailable. Patient undergoing a laparoscopic cholecystectomy.  Floria Raveling. Hartnett-Rands, MS, PT Per [place] [phone]  Pamala Hurry  Hartnett-Rands 03/29/2021, 11:02 AM

## 2021-03-29 NOTE — Interval H&P Note (Signed)
History and Physical Interval Note:  03/29/2021 10:39 AM  Stacey [name]  has presented today for surgery, with the diagnosis of gallstones, choledocolithiasis.  The various methods of treatment have been discussed with the patient and family. After consideration of risks, benefits and other options for treatment, the patient has consented to  Procedure(s): LAPAROSCOPIC CHOLECYSTECTOMY (N/A) as a surgical intervention.  The patient's history has been reviewed, patient examined, no change in status, stable for surgery.  I have reviewed the patient's chart and labs.  Questions were answered to the patient's satisfaction.    A little confused this AM. Overall feeling better.   [name]

## 2021-03-29 NOTE — Care Management Important Message (Signed)
Important Message  Patient Details  Name: Stacey [name] MRN: [id number] Date of Birth: 01-03-1946   Medicare Important Message Given:  Yes     Tommy Medal 03/29/2021, 12:11 PM

## 2021-03-29 NOTE — Progress Notes (Signed)
Patient very drowsy, any questions asked patient would mumble and fall back asleep.

## 2021-03-29 NOTE — Anesthesia Preprocedure Evaluation (Signed)
Anesthesia Evaluation  Patient identified by MRN, date of birth, ID band Patient awake    Reviewed: Allergy & Precautions, NPO status , Patient's Chart, lab work & pertinent test results, reviewed documented beta blocker date and time   Airway Mallampati: I  TM Distance: >3 FB Neck ROM: Full    Dental  (+) Dental Advisory Given, Upper Dentures, Partial Lower   Pulmonary shortness of breath and with exertion, COPD, Patient abstained from smoking., former smoker,    Pulmonary exam normal breath sounds clear to auscultation       Cardiovascular Exercise Tolerance: Poor hypertension, Pt. on medications and Pt. on home beta blockers + CAD, + Past MI, + Cardiac Stents and +CHF  + dysrhythmias Atrial Fibrillation and Supra Ventricular Tachycardia + Valvular Problems/Murmurs AS  Rhythm:Regular Rate:Normal + Systolic murmurs 1. Left ventricular ejection fraction, by estimation, is 50 to 55%. The left ventricle has low normal function. The left ventricle has no regional wall motion abnormalities. Left ventricular diastolic parameters are consistent with Grade I diastolic dysfunction (impaired relaxation).  2. Right ventricular systolic function is normal. The right ventricular size is normal. There is mildly elevated pulmonary artery systolic pressure. The estimated right ventricular systolic pressure is 38.1 mmHg.  3. Left atrial size was upper normal.  4. The mitral valve is grossly normal. Trivial mitral valve  regurgitation.  5. The aortic valve is tricuspid with some fusion of left and noncoronary cusps. There is moderate calcification of the aortic valve. Aortic valve regurgitation is not visualized. Moderate aortic valve stenosis. Aortic valve mean gradient measures 10.5 mmHg. Dimentionless index 0.31.  6. The inferior vena cava is normal in size with greater than 50% respiratory variability, suggesting right atrial pressure of 3 mmHg.    25-Mar-2021 17:29:13 [facility]-AP-300 ROUTINE RECORD 06-18-1946 (65 yr) Female Caucasian Room:A309 Loc:903 Technician: Harlow Asa Test ind: Vent. rate 75 BPM PR interval * ms QRS duration 146 ms QT/QTcB 410/457 ms P-R-T axes * -40 142 Normal sinus rhythm Left axis deviation Left bundle branch block Abnormal ECG Confirmed by Asencion Noble [id number]) on 03/26/2021 7:44:19 PM   Neuro/Psych PSYCHIATRIC DISORDERS Anxiety Depression Bipolar Disorder  Neuromuscular disease    GI/Hepatic negative GI ROS, Neg liver ROS,   Endo/Other  negative endocrine ROS  Renal/GU negative Renal ROS     Musculoskeletal  (+) Arthritis  (lumbar radiculopathy),   Abdominal   Peds  Hematology negative hematology ROS (+)   Anesthesia Other Findings Cardiology initially consulted for preoperative cardiac clearance for upcoming ERCP. Was cleared to proceed as outlined in full consult note from 03/25/2021. Was recommended to continue at least one antiplatelet peri-procedure then resume DAPT once deemed safe from a surgical perspective.   [facility] will sign off. Please re-consult if needed.  Medication Recommendations: As outlined on 03/25/2021. Other recommendations (labs, testing, etc): None Follow up as an outpatient: Keep routine outpatient follow-up with Dr. Harl Bowie  Metastatic breast cancer     Reproductive/Obstetrics negative OB ROS                             Anesthesia Physical  Anesthesia Plan  ASA: III  Anesthesia Plan: General   Post-op Pain Management:    Induction: Intravenous  PONV Risk Score and Plan: 4 or greater and Ondansetron and Dexamethasone  Airway Management Planned: Oral ETT  Additional Equipment:   Intra-op Plan:   Post-operative Plan: Extubation in OR  Informed Consent:  I have reviewed the patients History and Physical, chart, labs and discussed the procedure including the risks, benefits and  alternatives for the proposed anesthesia with the patient or authorized representative who has indicated his/her understanding and acceptance.     Dental advisory given  Plan Discussed with: CRNA and Surgeon  Anesthesia Plan Comments:         Anesthesia Quick Evaluation

## 2021-03-29 NOTE — Progress Notes (Signed)
Patient was found sitting on the side of the bed crying. She had an incontinent episode of stool and  Urine. She had pulled out all the soiled linen from under her and cleaned herself with wipes which were found in the trash can. Patient was tearful about the incident. However would stop crying to tell us where the linen was and how to make her bed. Patient's behavior is sporadic this morning. She also keeps her purse by her. I found a capsule on her bedside table and took it from the area.  I am unsure what this capsule is but she stated it was from home and for her hair.

## 2021-03-29 NOTE — Anesthesia Procedure Notes (Signed)
Procedure Name: Intubation Date/Time: 03/29/2021 11:04 AM Performed by: Hewitt Blade, CRNA Pre-anesthesia Checklist: Patient identified, Emergency Drugs available, Suction available and Patient being monitored Patient Re-evaluated:Patient Re-evaluated prior to induction Oxygen Delivery Method: Circle system utilized Preoxygenation: Pre-oxygenation with 100% oxygen Induction Type: IV induction Ventilation: Mask ventilation without difficulty Laryngoscope Size: Mac and 3 Grade View: Grade I Tube type: Oral Tube size: 7.0 mm Number of attempts: 1 Airway Equipment and Method: Stylet Placement Confirmation: ETT inserted through vocal cords under direct vision,  positive ETCO2 and breath sounds checked- equal and bilateral Secured at: 20 cm Tube secured with: Tape Dental Injury: Teeth and Oropharynx as per pre-operative assessment

## 2021-03-29 NOTE — Op Note (Signed)
Operative Note   Preoperative Diagnosis: Cholecystitis, choledocholithiasis    Postoperative Diagnosis: Same   Procedure(s) Performed: Laparoscopic cholecystectomy   Surgeon: Ria Comment C. Constance Haw, MD   Assistants: Aviva Signs, MD    Anesthesia: General endotracheal   Anesthesiologist: Denese Killings, MD    Specimens: Gallbladder    Estimated Blood Loss: Minimal    Blood Replacement: None    Complications: None    Operative Findings: Distended gallbladder with stones    Procedure: The patient was taken to the operating room and placed supine. General endotracheal anesthesia was induced. Intravenous antibiotics were administered per protocol. An orogastric tube positioned to decompress the stomach. The abdomen was prepared and draped in the usual sterile fashion.    A supraumbilical incision was made at her umbilical hernia that was reduced and a Veress technique was utilized to achieve pneumoperitoneum to 15 mmHg with carbon dioxide. A 11 mm optiview port was placed through the supraumbilical region, and a 10 mm 0-degree operative laparoscope was introduced. The area underlying the trocar and Veress needle were inspected and without evidence of injury.  Remaining trocars were placed under direct vision. Two 5 mm ports were placed in the right abdomen, between the anterior axillary and midclavicular line.  A final 11 mm port was placed through the mid-epigastrium, near the falciform ligament.  There was some minor omental adhesions in the right upper quadrant and around the umbilicus that were taken down.    The gallbladder fundus was elevated cephalad and the infundibulum was retracted to the patient's right. The gallbladder/cystic duct junction was skeletonized. The cystic artery noted in the triangle of Calot and was also skeletonized.  We then continued liberal medial and lateral dissection until the critical view of safety was achieved.    The cystic duct and cystic artery were  triply clipped and divided. A small posterior branch of the artery in the mesentery was clipped and the mesentery was burned.  The gallbladder was then dissected from the liver bed with electrocautery. The specimen was placed in an Endopouch and was retrieved through the epigastric site.   Final inspection revealed acceptable hemostasis. Arista and Surgical SNOW was placed in the gallbladder bed.  Trocars were removed and pneumoperitoneum was released.  0 Vicryl fascial sutures were used to close the epigastric and umbilical port sites. Skin incisions were closed with 4-0 Monocryl subcuticular sutures and Dermabond. The patient was awakened from anesthesia and extubated without complication.    Curlene Labrum, MD [facility] [address] [place], [place] [address] [phone] (office)

## 2021-03-30 ENCOUNTER — Encounter (HOSPITAL_COMMUNITY): Payer: Self-pay | Admitting: Internal Medicine

## 2021-03-30 ENCOUNTER — Inpatient Hospital Stay (HOSPITAL_COMMUNITY): Payer: Medicare Other

## 2021-03-30 LAB — BLOOD GAS, VENOUS
Acid-Base Excess: 6.1 mmol/L — ABNORMAL HIGH (ref 0.0–2.0)
Bicarbonate: 26.5 mmol/L (ref 20.0–28.0)
FIO2: 35
O2 Saturation: 32.5 %
Patient temperature: 37.3
pCO2, Ven: 77.8 mmHg (ref 44.0–60.0)
pH, Ven: 7.252 (ref 7.250–7.430)
pO2, Ven: <31 mmHg — CL (ref 32.0–45.0)

## 2021-03-30 LAB — BLOOD GAS, ARTERIAL
Acid-Base Excess: 7 mmol/L — ABNORMAL HIGH (ref 0.0–2.0)
Bicarbonate: 30.9 mmol/L — ABNORMAL HIGH (ref 20.0–28.0)
FIO2: 32
O2 Saturation: 99 %
Patient temperature: 37.8
pCO2 arterial: 41.1 mmHg (ref 32.0–48.0)
pH, Arterial: 7.488 — ABNORMAL HIGH (ref 7.350–7.450)
pO2, Arterial: 133 mmHg — ABNORMAL HIGH (ref 83.0–108.0)

## 2021-03-30 LAB — CBC WITH DIFFERENTIAL/PLATELET
Abs Immature Granulocytes: 0.03 10*3/uL (ref 0.00–0.07)
Basophils Absolute: 0 10*3/uL (ref 0.0–0.1)
Basophils Relative: 0 %
Eosinophils Absolute: 0.1 10*3/uL (ref 0.0–0.5)
Eosinophils Relative: 1 %
HCT: 35.2 % — ABNORMAL LOW (ref 36.0–46.0)
Hemoglobin: 11.1 g/dL — ABNORMAL LOW (ref 12.0–15.0)
Immature Granulocytes: 0 %
Lymphocytes Relative: 13 %
Lymphs Abs: 1.1 10*3/uL (ref 0.7–4.0)
MCH: 32.4 pg (ref 26.0–34.0)
MCHC: 31.5 g/dL (ref 30.0–36.0)
MCV: 102.6 fL — ABNORMAL HIGH (ref 80.0–100.0)
Monocytes Absolute: 0.6 10*3/uL (ref 0.1–1.0)
Monocytes Relative: 7 %
Neutro Abs: 6.8 10*3/uL (ref 1.7–7.7)
Neutrophils Relative %: 79 %
Platelets: 166 10*3/uL (ref 150–400)
RBC: 3.43 MIL/uL — ABNORMAL LOW (ref 3.87–5.11)
RDW: 14.5 % (ref 11.5–15.5)
WBC: 8.5 10*3/uL (ref 4.0–10.5)
nRBC: 0 % (ref 0.0–0.2)

## 2021-03-30 LAB — COMPREHENSIVE METABOLIC PANEL
ALT: 96 U/L — ABNORMAL HIGH (ref 0–44)
AST: 117 U/L — ABNORMAL HIGH (ref 15–41)
Albumin: 3.1 g/dL — ABNORMAL LOW (ref 3.5–5.0)
Alkaline Phosphatase: 76 U/L (ref 38–126)
Anion gap: 9 (ref 5–15)
BUN: 15 mg/dL (ref 8–23)
CO2: 31 mmol/L (ref 22–32)
Calcium: 9.3 mg/dL (ref 8.9–10.3)
Chloride: 103 mmol/L (ref 98–111)
Creatinine, Ser: 0.87 mg/dL (ref 0.44–1.00)
GFR, Estimated: >60 mL/min (ref >60)
Glucose, Bld: 126 mg/dL — ABNORMAL HIGH (ref 70–99)
Potassium: 3.5 mmol/L (ref 3.5–5.1)
Sodium: 143 mmol/L (ref 135–145)
Total Bilirubin: 1.3 mg/dL — ABNORMAL HIGH (ref 0.3–1.2)
Total Protein: 5.9 g/dL — ABNORMAL LOW (ref 6.5–8.1)

## 2021-03-30 LAB — MAGNESIUM: Magnesium: 1.6 mg/dL — ABNORMAL LOW (ref 1.7–2.4)

## 2021-03-30 MED ORDER — MUPIROCIN 2 % EX OINT
1.0000 "application " | TOPICAL_OINTMENT | Freq: Two times a day (BID) | CUTANEOUS | Status: AC
  Administered 2021-03-30 – 2021-04-04 (×9): 1 via NASAL
  Filled 2021-03-30 (×2): qty 22

## 2021-03-30 MED ORDER — METOPROLOL TARTRATE 5 MG/5ML IV SOLN
7.5000 mg | Freq: Four times a day (QID) | INTRAVENOUS | Status: DC
  Administered 2021-03-30: 7.5 mg via INTRAVENOUS
  Filled 2021-03-30: qty 10

## 2021-03-30 MED ORDER — MAGNESIUM SULFATE 4 GM/100ML IV SOLN
4.0000 g | Freq: Once | INTRAVENOUS | Status: AC
  Administered 2021-03-30: 4 g via INTRAVENOUS
  Filled 2021-03-30: qty 100

## 2021-03-30 MED ORDER — CHLORHEXIDINE GLUCONATE CLOTH 2 % EX PADS
6.0000 | MEDICATED_PAD | Freq: Every day | CUTANEOUS | Status: DC
  Administered 2021-03-30 – 2021-04-05 (×5): 6 via TOPICAL

## 2021-03-30 NOTE — Progress Notes (Signed)
New orders received for patient to be put on BIPAP. RT called. Patient will be transferred to ICU room 5. Spoke to ICU RN Sellers. Report given.

## 2021-03-30 NOTE — Progress Notes (Signed)
Patient still drowsy, unable to stay awake for long periods of time. Was able to tell me her name and where she was but thought it was [place]. Night time meds not given. MD Zierle-ghosh made aware. Patient O2 checked. 100 on 2L nasal cannula. Will continue to monitor.

## 2021-03-30 NOTE — Progress Notes (Signed)
Date and time results received: 03/30/21 at 0043 Test: Blood gas, venous  Critical Value: pCO2 76.5 and pO2 <31   Name of Provider Notified: Zierle-ghosh MD.  No new orders at this time.

## 2021-03-30 NOTE — Progress Notes (Signed)
PROGRESS NOTE  Stacey [name] IWL:[id number] DOB: February 03, 1946 DOA: 03/24/2021 PCP: Sela Hilding   Brief History:  75 year old female with a history of coronary artery disease status post DES to the RCA December 1740, systolic & diastolic CHF, COPD, hypertension, metastatic breast cancer, hyperlipidemia, PSVT, chronic pain syndrome status post intrathecal pump placement presenting with abdominal pain that began on 03/24/2021 that was severe rating 10/10 diffuse in nature.  The patient has subjective fevers and chills.  She had nausea without emesis.  She denies any diarrhea.  She denies any headache, cough, hemoptysis, chest pain, worsening shortness of breath, hematochezia, melena, dysuria, hematuria. In the emergency department, the patient was afebrile hemodynamically stable with oxygen saturation 100% on 3 L.  CT of the abdomen and pelvis showed gallbladder wall thickening and edema with infiltration into the pericholecystic fat.  There was extrahepatic ductal dilatation with suggestion of stones in the distal CBD and moderate intrahepatic biliary ductal dilatation.  There was infiltration and thickening around the proximal duodenum probably referred from the gallbladder.  The patient was started on Zosyn.  GI and general surgery were consulted to assist.  Assessment/Plan:  Acute respiratory failure with hypercarbia - likely side effect of anesthesia -PCO2 coming down with bipap therapy - morning ABG reassuring - CXR no acute findings - careful with opioids - continue supportive measures -keep in SDU overnight  Acute cholecystitis/ POD#1 s/p Lap Chole  -General surgery consult appreciated-->lap chole 4/29 with Dr. Constance Haw -Continue IV Zosyn -Judicious IV fluids -Judicious opioids for pain control  Transaminasemia with hyperbilirubinemia -GI consult appreciated -ERCP planned 03/28/21 (allowing plavix washout) -due to cholecystitis/choledocholithiasis -ERCP was  successful with 3 pigmented stones removed  Chronic systolic and diastolic CHF -Appears clinically euvolemic -04/18/2020 echo EF 35-40%, G1DD, mild to moderate AAS, mild MR -Continue metoprolol succinate -03/25/21 Echo EF 50-55%, G1DD, no WMA, trivial MR  Chronic respiratory failure with nocturnal hypoxia -currently on bipap therapy in stepdown ICU -Patient is normally on 1 L nasal cannula at nighttime -Monitor clinically -Incentive spirometry will be encouraged when off bipap therapy  Coronary artery disease -No chest pain presently -plavix and aspirin temporarily on hold, hopefully can resume soon after surgery -Continue metoprolol succinate (temporarily IV while on bipap)  Chronic pain syndrome -Patient has intrathecal morphine pump infusing 5.1 mg per 24-hour. -Continue home dose oxycodone and Lyrica  Metastatic breast cancer to the bone -02/19/2021 bone scan--negative for bone metastasis -Outpatient follow-up with Dr. Delton Coombes  COPD -Stable without exacerbation  Chronic L1/L2 compression fracture -Continue home opioid regimen  Essential hypertension -Continue metoprolol succinate   Hyperlipidemia -Holding Lipitor secondary to elevated LFTs initially  Hypokalemia -Magnesium stable around 1.7  Macrocytic anemia -Check B12 and folate -baseline Hgb 11-12  Morbid Obesity -BMI 39.09 -lifestyle modification  Status is: Inpatient  Appropriate for inpatient due to severity of illness  Dispo: The patient is from: Home  Anticipated d/c is to: Home  Patient currently is not medically stable to d/c.              Difficult to place patient No  Family Communication:  no family present during bedside rounds  Consultants:  GI, general surgery  Code Status:  FULL   DVT Prophylaxis:  SCDs   Procedures: As Listed in Progress Note Above  Antibiotics: Zosyn 4/25>>  Subjective: Patient had increasing somnolence  postop and placed on bipap and moved to SDU. Currently her mentation is better and she is  following commands well.     Objective: Vitals:   03/30/21 0801 03/30/21 1000 03/30/21 1100 03/30/21 1318  BP:  (!) 148/60 (!) 161/52   Pulse:  89 88   Resp:  20 20   Temp: 100.1 F (37.8 C)   99.5 F (37.5 C)  TempSrc: Axillary   Axillary  SpO2:  100% 100%   Weight:      Height:        Intake/Output Summary (Last 24 hours) at 03/30/2021 1345 Last data filed at 03/30/2021 1157 Gross per 24 hour  Intake 192.49 ml  Output 1100 ml  Net -907.51 ml   Weight change:  Exam:   General:  Pt is somnolent but easily arousable and follows commands appropriately, not in acute distress  HEENT: No icterus, No thrush, No neck mass, [place]/AT  Cardiovascular: irregularly irregular S1/S2, no rubs, no gallops  Respiratory: on bipap therapy.   Abdomen: persistent epigastric and RUQ  Tenderness with murphy sign, no guarding  Extremities: No edema, No lymphangitis, No petechiae, No rashes, no synovitis  Data Reviewed: I have personally reviewed following labs and imaging studies Basic Metabolic Panel: Recent Labs  Lab 03/25/21 0455 03/26/21 0700 03/27/21 0503 03/28/21 0430 03/29/21 0432 03/30/21 0415  NA 142 144 141 143 141 143  K 3.6 3.6 3.5 4.0 4.1 3.5  CL 101 104 101 103 103 103  CO2 32 33* 30 33* 27 31  GLUCOSE 134* 90 95 97 151* 126*  BUN 22 18 13 9 15 15   CREATININE 0.76 0.85 0.76 0.67 0.85 0.87  CALCIUM 9.4 9.4 9.3 9.7 9.8 9.3  MG 1.7 1.9 1.7 1.7 1.7 1.6*  PHOS 4.6  --   --   --   --   --    Liver Function Tests: Recent Labs  Lab 03/26/21 0700 03/27/21 0503 03/28/21 0430 03/29/21 0432 03/30/21 0415  AST 177* 87* 56* 48* 117*  ALT 177* 117* 82* 65* 96*  ALKPHOS 89 78 84 83 76  BILITOT 1.4* 1.4* 1.3* 1.0 1.3*  PROT 5.4* 5.6* 5.9* 6.1* 5.9*  ALBUMIN 2.8* 3.0* 3.1* 3.3* 3.1*   Recent Labs  Lab 03/24/21 2201  LIPASE 20   No results for input(s): AMMONIA in the last 168  hours. Coagulation Profile: Recent Labs  Lab 03/25/21 0455  INR 1.0   CBC: Recent Labs  Lab 03/24/21 2201 03/25/21 0455 03/26/21 0700 03/28/21 0430 03/29/21 0432 03/30/21 0415  WBC 11.3* 4.7 4.0 4.9 4.3 8.5  NEUTROABS 8.9*  --   --  2.4 2.7 6.8  HGB 12.6 10.8* 10.6* 10.8* 11.0* 11.1*  HCT 37.0 33.2* 33.1* 33.0* 33.3* 35.2*  MCV 95.9 100.6* 101.8* 98.8 98.8 102.6*  PLT 261 202 182 189 195 166   Cardiac Enzymes: No results for input(s): CKTOTAL, CKMB, CKMBINDEX, TROPONINI in the last 168 hours. BNP: Invalid input(s): POCBNP CBG: Recent Labs  Lab 03/28/21 1420  GLUCAP 160*   HbA1C: No results for input(s): HGBA1C in the last 72 hours. Urine analysis:    Component Value Date/Time   COLORURINE AMBER (A) 03/24/2021 2201   APPEARANCEUR CLOUDY (A) 03/24/2021 2201   LABSPEC 1.038 (H) 03/24/2021 2201   PHURINE 5.0 03/24/2021 2201   GLUCOSEU 50 (A) 03/24/2021 2201   HGBUR NEGATIVE 03/24/2021 2201   BILIRUBINUR NEGATIVE 03/24/2021 2201   KETONESUR 5 (A) 03/24/2021 2201   PROTEINUR 100 (A) 03/24/2021 2201   NITRITE NEGATIVE 03/24/2021 2201   LEUKOCYTESUR NEGATIVE 03/24/2021 2201   Sepsis Labs:  Recent  Results (from the past 240 hour(s))  Resp Panel by RT-PCR (Flu A&B, Covid) Nasopharyngeal Swab     Status: None   Collection Time: 03/25/21  1:48 AM   Specimen: Nasopharyngeal Swab; Nasopharyngeal(NP) swabs in vial transport medium  Result Value Ref Range Status   SARS Coronavirus 2 by RT PCR NEGATIVE NEGATIVE Final    Comment: (NOTE) SARS-CoV-2 target nucleic acids are NOT DETECTED.  The SARS-CoV-2 RNA is generally detectable in upper respiratory specimens during the acute phase of infection. The lowest concentration of SARS-CoV-2 viral copies this assay can detect is 138 copies/mL. A negative result does not preclude SARS-Cov-2 infection and should not be used as the sole basis for treatment or other patient management decisions. A negative result may occur with   improper specimen collection/handling, submission of specimen other than nasopharyngeal swab, presence of viral mutation(s) within the areas targeted by this assay, and inadequate number of viral copies(<138 copies/mL). A negative result must be combined with clinical observations, patient history, and epidemiological information. The expected result is Negative.  Fact Sheet for Patients:  EntrepreneurPulse.com.au  Fact Sheet for Healthcare Providers:  IncredibleEmployment.be  This test is no t yet approved or cleared by the [place] FDA and  has been authorized for detection and/or diagnosis of SARS-CoV-2 by FDA under an Emergency Use Authorization (EUA). This EUA will remain  in effect (meaning this test can be used) for the duration of the COVID-19 declaration under Section 564(b)(1) of the Act, 21 U.S.C.section 360bbb-3(b)(1), unless the authorization is terminated  or revoked sooner.       Influenza A by PCR NEGATIVE NEGATIVE Final   Influenza B by PCR NEGATIVE NEGATIVE Final    Comment: (NOTE) The Xpert Xpress SARS-CoV-2/FLU/RSV plus assay is intended as an aid in the diagnosis of influenza from Nasopharyngeal swab specimens and should not be used as a sole basis for treatment. Nasal washings and aspirates are unacceptable for Xpert Xpress SARS-CoV-2/FLU/RSV testing.  Fact Sheet for Patients: EntrepreneurPulse.com.au  Fact Sheet for Healthcare Providers: IncredibleEmployment.be  This test is not yet approved or cleared by the [place] FDA and has been authorized for detection and/or diagnosis of SARS-CoV-2 by FDA under an Emergency Use Authorization (EUA). This EUA will remain in effect (meaning this test can be used) for the duration of the COVID-19 declaration under Section 564(b)(1) of the Act, 21 U.S.C. section 360bbb-3(b)(1), unless the authorization is terminated  or revoked.  Performed at [facility], [address] [place], [place] [address]   Surgical pcr screen     Status: Abnormal   Collection Time: 03/29/21  8:41 AM   Specimen: Nasal Mucosa; Nasal Swab  Result Value Ref Range Status   MRSA, PCR POSITIVE (A) NEGATIVE Final    Comment: RESULT CALLED TO, READ BACK BY AND VERIFIED WITH: TORI RN @ 1118 ON [id number] BY HENDERSON L.    Staphylococcus aureus POSITIVE (A) NEGATIVE Final    Comment: RESULT CALLED TO, READ BACK BY AND VERIFIED WITH: TORI RN @ 4034 ON [id number] BY HENDERSON L. (NOTE) The Xpert SA Assay (FDA approved for NASAL specimens in patients 70 years of age and older), is one component of a comprehensive surveillance program. It is not intended to diagnose infection nor to guide or monitor treatment. Performed at [facility], [address] [place], [place] [address]      Scheduled Meds: . vitamin C  500 mg Oral Daily  . Chlorhexidine Gluconate Cloth  6 each Topical Daily  .  docusate sodium  100 mg Oral BID  . metoprolol tartrate  7.5 mg Intravenous Q6H  . multivitamin with minerals  1 tablet Oral Daily  . mupirocin ointment  1 application Nasal BID  . pregabalin  50 mg Oral TID   Continuous Infusions:   Procedures/Studies: CT ABDOMEN PELVIS W CONTRAST  Result Date: 03/25/2021 CLINICAL DATA:  Acute abdominal pain. History of MI, hypertension, congestive heart failure, breast cancer. EXAM: CT ABDOMEN AND PELVIS WITH CONTRAST TECHNIQUE: Multidetector CT imaging of the abdomen and pelvis was performed using the standard protocol following bolus administration of intravenous contrast. CONTRAST:  11mL OMNIPAQUE IOHEXOL 300 MG/ML  SOLN COMPARISON:  02/19/2021 FINDINGS: Lower chest: Lung bases are clear. Hepatobiliary: Gallbladder wall thickening and edema with infiltration into the pericholecystic fat. Small stones layering in the gallbladder. Extrahepatic bile duct dilatation with suggestion of stones in the distal common bile  duct. Moderate intrahepatic bile duct dilatation. No focal liver lesions. Small amount of perihepatic ascites. Pancreas: Unremarkable. No pancreatic ductal dilatation or surrounding inflammatory changes. Spleen: Normal in size without focal abnormality. Adrenals/Urinary Tract: No adrenal gland nodules. Complex hypoenhancing lesion demonstrated in the left kidney measuring 1.9 cm diameter. This may represent a renal cell carcinoma. Similar appearance to previous study. No hydronephrosis or hydroureter. Bladder wall is thickened, probably due to under distention although cystitis could also have this appearance. Stomach/Bowel: Stomach, small bowel, and colon are not abnormally distended. Scattered colonic diverticula without evidence of diverticulitis. Infiltration and wall thickening around the proximal duodenum probably is referred inflammation from the gallbladder. Duodenitis could also be present. Appendix is not identified. Vascular/Lymphatic: Aortic atherosclerosis. No enlarged abdominal or pelvic lymph nodes. Reproductive: Uterus is surgically absent. No abnormal adnexal masses. Other: No free air in the abdomen. Moderate-sized periumbilical hernia containing fat. Musculoskeletal: Degenerative changes in the spine. Lumbar scoliosis convex towards the left. Sclerotic bone lesions consistent with metastatic disease and vertebral compression of L2. IMPRESSION: 1. Cholelithiasis with changes of acute cholecystitis. Bile duct dilatation with probable stones in the distal common bile duct. 2. Infiltration and wall thickening around the proximal duodenum probably is referred inflammation from the gallbladder. Duodenitis could also have this appearance. 3. Complex hypoenhancing lesion in the left kidney may represent a renal cell carcinoma. No change since prior study. 4. Sclerotic bone lesions consistent with metastatic disease. Compression of L2. 5. Moderate-sized periumbilical hernia containing fat. 6. Aortic  atherosclerosis. Aortic Atherosclerosis (ICD10-I70.0). Electronically Signed   By: Lucienne Capers M.D.   On: 03/25/2021 00:50   DG CHEST PORT 1 VIEW  Result Date: 03/30/2021 CLINICAL DATA:  Acute on chronic respiratory failure.  Hypercapnia. EXAM: PORTABLE CHEST 1 VIEW COMPARISON:  November 15, 2019 FINDINGS: The heart size and mediastinal contours are within normal limits. Both lungs are clear. The visualized skeletal structures are unremarkable. IMPRESSION: No active disease. Electronically Signed   By: Dorise Bullion III M.D   On: 03/30/2021 08:52   DG ERCP BILIARY & PANCREATIC DUCTS  Result Date: 03/28/2021 CLINICAL DATA:  Choledocholithiasis EXAM: ERCP TECHNIQUE: Multiple spot images obtained with the fluoroscopic device and submitted for interpretation post-procedure. COMPARISON:  CT abdomen pelvis-03/25/2021 FLUOROSCOPY TIME:  2 minutes, 2 seconds (39.4 mGy) FINDINGS: Multiple spot fluoroscopic images the right upper abdominal quadrant during ERCP are provided for review. Initial image demonstrates an ERCP probe overlying the right upper abdominal quadrant. Subsequent images demonstrate selective cannulation opacification of the common duct which appears moderately dilated. There are several transient nonocclusive filling defects within the CBD which  could represent air bubbles versus nonocclusive choledocholithiasis. Subsequent images demonstrate insufflation of a balloon within the central aspect of the CBD with subsequent biliary sweeping and presumed stone extraction and sphincterotomy There is minimal opacification of the central aspect of the intrahepatic biliary tree which appears mildly dilated. There is faint opacification of the gallbladder lumen. There is no definitive opacification of the pancreatic duct. IMPRESSION: ERCP with biliary sweeping and presumed stone extraction and sphincterotomy as above. These images were submitted for radiologic interpretation only. Please see the  procedural report for the amount of contrast and the fluoroscopy time utilized. Electronically Signed   By: Sandi Mariscal M.D.   On: 03/28/2021 14:23   ECHOCARDIOGRAM COMPLETE  Result Date: 03/25/2021    ECHOCARDIOGRAM REPORT   Patient Name:   SA FEHRMAN Date of Exam: 03/25/2021 Medical Rec #:  [id number]         Height:       60.0 in Accession #:    [id number]        Weight:       200.2 lb Date of Birth:  1946-01-16          BSA:          1.867 m Patient Age:    83 years          BP:           141/72 mmHg Patient Gender: F                 HR:           71 bpm. Exam Location:  [facility] Procedure: 2D Echo, Cardiac Doppler and Color Doppler Indications:    CHF  History:        Patient has prior history of Echocardiogram examinations, most                 recent 04/18/2020. CHF, CAD, COPD, Aortic Valve Disease and                 Aortic stenosis; Risk Factors:Hypertension, Dyslipidemia and                 morbid obestiy. Metastatic breast cancer.  Sonographer:    Dustin Flock RDCS Referring Phys: [phone] DAVID TAT IMPRESSIONS  1. Left ventricular ejection fraction, by estimation, is 50 to 55%. The left ventricle has low normal function. The left ventricle has no regional wall motion abnormalities. Left ventricular diastolic parameters are consistent with Grade I diastolic dysfunction (impaired relaxation).  2. Right ventricular systolic function is normal. The right ventricular size is normal. There is mildly elevated pulmonary artery systolic pressure. The estimated right ventricular systolic pressure is 0000000 mmHg.  3. Left atrial size was upper normal.  4. The mitral valve is grossly normal. Trivial mitral valve regurgitation.  5. The aortic valve is tricuspid with some fusion of left and noncoronary cusps. There is moderate calcification of the aortic valve. Aortic valve regurgitation is not visualized. Moderate aortic valve stenosis. Aortic valve mean gradient measures 10.5 mmHg. Dimentionless index 0.31.   6. The inferior vena cava is normal in size with greater than 50% respiratory variability, suggesting right atrial pressure of 3 mmHg. FINDINGS  Left Ventricle: Left ventricular ejection fraction, by estimation, is 50 to 55%. The left ventricle has low normal function. The left ventricle has no regional wall motion abnormalities. The left ventricular internal cavity size was normal in size. There is borderline left ventricular hypertrophy. Abnormal (paradoxical) septal motion, consistent  with left bundle branch block. Left ventricular diastolic parameters are consistent with Grade I diastolic dysfunction (impaired relaxation). Right Ventricle: The right ventricular size is normal. No increase in right ventricular wall thickness. Right ventricular systolic function is normal. There is mildly elevated pulmonary artery systolic pressure. The tricuspid regurgitant velocity is 3.24  m/s, and with an assumed right atrial pressure of 3 mmHg, the estimated right ventricular systolic pressure is 44.0 mmHg. Left Atrium: Left atrial size was upper normal. Right Atrium: Right atrial size was normal in size. Pericardium: There is no evidence of pericardial effusion. Mitral Valve: The mitral valve is grossly normal. Mild mitral annular calcification. Trivial mitral valve regurgitation. Tricuspid Valve: The tricuspid valve is grossly normal. Tricuspid valve regurgitation is mild. Aortic Valve: The aortic valve is tricuspid. There is moderate calcification of the aortic valve. There is moderate aortic valve annular calcification. Aortic valve regurgitation is not visualized. Moderate aortic stenosis is present. Aortic valve mean gradient measures 10.5 mmHg. Aortic valve peak gradient measures 18.1 mmHg. Aortic valve area, by VTI measures 1.09 cm. Pulmonic Valve: The pulmonic valve was grossly normal. Pulmonic valve regurgitation is trivial. Aorta: The aortic root is normal in size and structure. Venous: The inferior vena cava is  normal in size with greater than 50% respiratory variability, suggesting right atrial pressure of 3 mmHg. IAS/Shunts: No atrial level shunt detected by color flow Doppler.  LEFT VENTRICLE PLAX 2D LVIDd:         4.35 cm  Diastology LVIDs:         3.18 cm  LV e' medial:    5.44 cm/s LV PW:         1.05 cm  LV E/e' medial:  11.7 LV IVS:        1.08 cm  LV e' lateral:   7.40 cm/s LVOT diam:     2.10 cm  LV E/e' lateral: 8.6 LV SV:         52 LV SV Index:   28 LVOT Area:     3.46 cm  RIGHT VENTRICLE RV Basal diam:  3.00 cm RV S prime:     7.07 cm/s TAPSE (M-mode): 2.1 cm LEFT ATRIUM             Index       RIGHT ATRIUM           Index LA diam:        3.80 cm 2.04 cm/m  RA Area:     11.70 cm LA Vol (A2C):   37.5 ml 20.08 ml/m RA Volume:   32.00 ml  17.14 ml/m LA Vol (A4C):   60.0 ml 32.13 ml/m LA Biplane Vol: 50.8 ml 27.20 ml/m  AORTIC VALVE AV Area (Vmax):    1.10 cm AV Area (Vmean):   1.16 cm AV Area (VTI):     1.09 cm AV Vmax:           213.00 cm/s AV Vmean:          149.000 cm/s AV VTI:            0.477 m AV Peak Grad:      18.1 mmHg AV Mean Grad:      10.5 mmHg LVOT Vmax:         67.90 cm/s LVOT Vmean:        50.000 cm/s LVOT VTI:          0.150 m LVOT/AV VTI ratio: 0.31  AORTA Ao Root diam: 2.70 cm  MITRAL VALVE               TRICUSPID VALVE MV Area (PHT): 2.68 cm    TR Peak grad:   42.0 mmHg MV Decel Time: 283 msec    TR Vmax:        324.00 cm/s MV E velocity: 63.80 cm/s MV A velocity: 89.50 cm/s  SHUNTS MV E/A ratio:  0.71        Systemic VTI:  0.15 m                            Systemic Diam: 2.10 cm Rozann Lesches MD Electronically signed by Rozann Lesches MD Signature Date/Time: 03/25/2021/2:24:21 PM    Final    Irwin Brakeman, MD  [facility]  If 7PM-7AM, please contact night-coverage [email or web address] Password [email or web address] 03/30/2021, 1:45 PM   LOS: 5 days

## 2021-03-30 NOTE — Progress Notes (Signed)
Removed from Bipap and placed on 3lpm [place]. Patient tolerating well. No issues transitioning.

## 2021-03-30 NOTE — Progress Notes (Addendum)
[facility] Progress Note  1 Day Post-Op  Subjective: Overnight had some respiratory distress/ confusion, ABG with hypercapnia and put on Bipap. CXR without fluid. Was very confused and sleepy per report and is now currently with her sister.  Moved to the ICU for monitoring.   Low grade fever over night.    Objective: Vital signs in last 24 hours: Temp:  [97.5 F (36.4 C)-100.1 F (37.8 C)] 100.1 F (37.8 C) (04/30 0801) Pulse Rate:  [65-100] 88 (04/30 1100) Resp:  [12-21] 20 (04/30 1100) BP: (129-193)/(52-89) 161/52 (04/30 1100) SpO2:  [93 %-100 %] 100 % (04/30 1100) Weight:  [93.8 kg] 93.8 kg (04/30 0444) Last BM Date: 03/24/21  Intake/Output from previous day: 04/29 0701 - 04/30 0700 In: 700 [I.V.:700] Out: 900 [Urine:900] Intake/Output this shift: Total I/O In: 192.5 [IV Piggyback:192.5] Out: 200 [Urine:200]  General appearance: no distress and resting, eyes closed Resp: Bipap, looks comfortable GI: soft, nondistended, appropriatey tender  Lab Results:  Recent Labs    03/29/21 0432 03/30/21 0415  WBC 4.3 8.5  HGB 11.0* 11.1*  HCT 33.3* 35.2*  PLT 195 166   BMET Recent Labs    03/29/21 0432 03/30/21 0415  NA 141 143  K 4.1 3.5  CL 103 103  CO2 27 31  GLUCOSE 151* 126*  BUN 15 15  CREATININE 0.85 0.87  CALCIUM 9.8 9.3   PT/INR No results for input(s): LABPROT, INR in the last 72 hours.  Studies/Results: DG CHEST PORT 1 VIEW  Result Date: 03/30/2021 CLINICAL DATA:  Acute on chronic respiratory failure.  Hypercapnia. EXAM: PORTABLE CHEST 1 VIEW COMPARISON:  November 15, 2019 FINDINGS: The heart size and mediastinal contours are within normal limits. Both lungs are clear. The visualized skeletal structures are unremarkable. IMPRESSION: No active disease. Electronically Signed   By: Dorise Bullion III M.D   On: 03/30/2021 08:52   DG ERCP BILIARY & PANCREATIC DUCTS  Result Date: 03/28/2021 CLINICAL DATA:  Choledocholithiasis EXAM:  ERCP TECHNIQUE: Multiple spot images obtained with the fluoroscopic device and submitted for interpretation post-procedure. COMPARISON:  CT abdomen pelvis-03/25/2021 FLUOROSCOPY TIME:  2 minutes, 2 seconds (39.4 mGy) FINDINGS: Multiple spot fluoroscopic images the right upper abdominal quadrant during ERCP are provided for review. Initial image demonstrates an ERCP probe overlying the right upper abdominal quadrant. Subsequent images demonstrate selective cannulation opacification of the common duct which appears moderately dilated. There are several transient nonocclusive filling defects within the CBD which could represent air bubbles versus nonocclusive choledocholithiasis. Subsequent images demonstrate insufflation of a balloon within the central aspect of the CBD with subsequent biliary sweeping and presumed stone extraction and sphincterotomy There is minimal opacification of the central aspect of the intrahepatic biliary tree which appears mildly dilated. There is faint opacification of the gallbladder lumen. There is no definitive opacification of the pancreatic duct. IMPRESSION: ERCP with biliary sweeping and presumed stone extraction and sphincterotomy as above. These images were submitted for radiologic interpretation only. Please see the procedural report for the amount of contrast and the fluoroscopy time utilized. Electronically Signed   By: Sandi Mariscal M.D.   On: 03/28/2021 14:23    Anti-infectives: Anti-infectives (From admission, onward)   Start     Dose/Rate Route Frequency Ordered Stop   03/29/21 2000  piperacillin-tazobactam (ZOSYN) IVPB 3.375 g        3.375 g 12.5 mL/hr over 240 Minutes Intravenous Every 8 hours 03/29/21 1510 03/30/21 0834   03/29/21 1102  sodium chloride 0.9 % with  cefoTEtan (CEFOTAN) ADS Med       Note to Pharmacy: Abbie Sons   : cabinet override      03/29/21 1102 03/29/21 2314   03/29/21 0600  cefoTEtan (CEFOTAN) 2 g in sodium chloride 0.9 % 100 mL IVPB   Status:  Discontinued        2 g 200 mL/hr over 30 Minutes Intravenous On call to O.R. 03/28/21 1355 03/28/21 1411   03/25/21 0600  piperacillin-tazobactam (ZOSYN) IVPB 3.375 g  Status:  Discontinued        3.375 g 100 mL/hr over 30 Minutes Intravenous Every 6 hours 03/25/21 0321 03/25/21 0323   03/25/21 0600  piperacillin-tazobactam (ZOSYN) IVPB 3.375 g  Status:  Discontinued        3.375 g 12.5 mL/hr over 240 Minutes Intravenous Every 8 hours 03/25/21 0324 03/29/21 1510   03/25/21 0130  cefTRIAXone (ROCEPHIN) 1 g in sodium chloride 0.9 % 100 mL IVPB        1 g 200 mL/hr over 30 Minutes Intravenous  Once 03/25/21 0118 03/25/21 0200      Assessment/Plan: Ms. Mol is a 75 yo with some hypercapnia overnight? But on venous sticks so less reliable, improved today and confusion on Bipap now. POD 1  S/p lap chole and POD 2 s/p ERCP for cholecystitis and choledocholithiasis. CXR without fluid. Unsure if some of this is related to anesthesia/ sedation.   Weaning off Bipap Hopefully can start diet once off Discussed with Dr. Wynetta Emery.  LFT and WBC reassuring  Given the sedation will hold any PRN narcotics outside of her pump and require RN to contact the Hospitalist if the patient needs additional narcotic; no narcotics given last night that I can see on reviewing chart    LOS: 5 days    [name] 03/30/2021

## 2021-03-31 LAB — CBC
HCT: 30 % — ABNORMAL LOW (ref 36.0–46.0)
Hemoglobin: 9.7 g/dL — ABNORMAL LOW (ref 12.0–15.0)
MCH: 32.8 pg (ref 26.0–34.0)
MCHC: 32.3 g/dL (ref 30.0–36.0)
MCV: 101.4 fL — ABNORMAL HIGH (ref 80.0–100.0)
Platelets: 155 10*3/uL (ref 150–400)
RBC: 2.96 MIL/uL — ABNORMAL LOW (ref 3.87–5.11)
RDW: 14.2 % (ref 11.5–15.5)
WBC: 7.4 10*3/uL (ref 4.0–10.5)
nRBC: 0 % (ref 0.0–0.2)

## 2021-03-31 LAB — COMPREHENSIVE METABOLIC PANEL
ALT: 71 U/L — ABNORMAL HIGH (ref 0–44)
AST: 72 U/L — ABNORMAL HIGH (ref 15–41)
Albumin: 2.8 g/dL — ABNORMAL LOW (ref 3.5–5.0)
Alkaline Phosphatase: 68 U/L (ref 38–126)
Anion gap: 8 (ref 5–15)
BUN: 15 mg/dL (ref 8–23)
CO2: 30 mmol/L (ref 22–32)
Calcium: 8.7 mg/dL — ABNORMAL LOW (ref 8.9–10.3)
Chloride: 100 mmol/L (ref 98–111)
Creatinine, Ser: 0.64 mg/dL (ref 0.44–1.00)
GFR, Estimated: >60 mL/min (ref >60)
Glucose, Bld: 102 mg/dL — ABNORMAL HIGH (ref 70–99)
Potassium: 4.1 mmol/L (ref 3.5–5.1)
Sodium: 138 mmol/L (ref 135–145)
Total Bilirubin: 2 mg/dL — ABNORMAL HIGH (ref 0.3–1.2)
Total Protein: 5.6 g/dL — ABNORMAL LOW (ref 6.5–8.1)

## 2021-03-31 LAB — BLOOD GAS, VENOUS
Acid-Base Excess: 7.1 mmol/L — ABNORMAL HIGH (ref 0.0–2.0)
Bicarbonate: 27.8 mmol/L (ref 20.0–28.0)
FIO2: 28
O2 Saturation: 28.5 %
Patient temperature: 36.5
pCO2, Ven: 76.5 mmHg (ref 44.0–60.0)
pH, Ven: 7.267 (ref 7.250–7.430)
pO2, Ven: <31 mmHg — CL (ref 32.0–45.0)

## 2021-03-31 MED ORDER — METOPROLOL SUCCINATE ER 50 MG PO TB24
150.0000 mg | ORAL_TABLET | Freq: Every day | ORAL | Status: DC
  Administered 2021-03-31 – 2021-04-05 (×6): 150 mg via ORAL
  Filled 2021-03-31 (×6): qty 3

## 2021-03-31 MED ORDER — MORPHINE SULFATE (PF) 4 MG/ML IV SOLN
4.0000 mg | Freq: Once | INTRAVENOUS | Status: AC
  Administered 2021-03-31: 4 mg via INTRAVENOUS
  Filled 2021-03-31: qty 1

## 2021-03-31 MED ORDER — POLYVINYL ALCOHOL 1.4 % OP SOLN
1.0000 [drp] | Freq: Three times a day (TID) | OPHTHALMIC | Status: DC
  Filled 2021-03-31: qty 15

## 2021-03-31 MED ORDER — POLYVINYL ALCOHOL 1.4 % OP SOLN
1.0000 [drp] | Freq: Three times a day (TID) | OPHTHALMIC | Status: DC
  Administered 2021-03-31 – 2021-04-05 (×13): 1 [drp] via OPHTHALMIC
  Filled 2021-03-31 (×2): qty 15

## 2021-03-31 MED ORDER — DULOXETINE HCL 60 MG PO CPEP
90.0000 mg | ORAL_CAPSULE | Freq: Every day | ORAL | Status: DC
  Administered 2021-03-31 – 2021-04-05 (×6): 90 mg via ORAL
  Filled 2021-03-31 (×6): qty 1

## 2021-03-31 MED ORDER — OXYCODONE HCL 5 MG PO TABS
5.0000 mg | ORAL_TABLET | Freq: Four times a day (QID) | ORAL | Status: DC | PRN
  Administered 2021-03-31 – 2021-04-05 (×12): 5 mg via ORAL
  Filled 2021-03-31 (×13): qty 1

## 2021-03-31 NOTE — Progress Notes (Signed)
[facility] Progress Note  2 Days Post-Op  Subjective: Much better today. Awake and watching her ipad. Tolerating some clears and wanting to eat.  Requesting her home roxicodone too.   Objective: Vital signs in last 24 hours: Temp:  [98.3 F (36.8 C)-99.5 F (37.5 C)] 99.1 F (37.3 C) (05/01 0759) Pulse Rate:  [88-120] 109 (05/01 0700) Resp:  [12-22] 19 (05/01 0700) BP: (109-161)/(43-103) 152/48 (05/01 0700) SpO2:  [93 %-100 %] 97 % (05/01 0700) Weight:  [92.8 kg] 92.8 kg (05/01 0414) Last BM Date: 03/30/21  Intake/Output from previous day: 04/30 0701 - 05/01 0700 In: 1032.5 [P.O.:840; IV Piggyback:192.5] Out: 350 [Urine:350] Intake/Output this shift: No intake/output data recorded.  General appearance: alert, cooperative and no distress Resp: normal work of breathing GI: soft, nondistended, appropriately tender, port sites c/d/i with dermabond, no drainage or erythema  Lab Results:  Recent Labs    03/30/21 0415 03/31/21 0350  WBC 8.5 7.4  HGB 11.1* 9.7*  HCT 35.2* 30.0*  PLT 166 155   BMET Recent Labs    03/30/21 0415 03/31/21 0350  NA 143 138  K 3.5 4.1  CL 103 100  CO2 31 30  GLUCOSE 126* 102*  BUN 15 15  CREATININE 0.87 0.64  CALCIUM 9.3 8.7*   PT/INR No results for input(s): LABPROT, INR in the last 72 hours.  Studies/Results: DG CHEST PORT 1 VIEW  Result Date: 03/30/2021 CLINICAL DATA:  Acute on chronic respiratory failure.  Hypercapnia. EXAM: PORTABLE CHEST 1 VIEW COMPARISON:  November 15, 2019 FINDINGS: The heart size and mediastinal contours are within normal limits. Both lungs are clear. The visualized skeletal structures are unremarkable. IMPRESSION: No active disease. Electronically Signed   By: Dorise Bullion III M.D   On: 03/30/2021 08:52    Anti-infectives: Anti-infectives (From admission, onward)   Start     Dose/Rate Route Frequency Ordered Stop   03/29/21 2000  piperacillin-tazobactam (ZOSYN) IVPB 3.375 g         3.375 g 12.5 mL/hr over 240 Minutes Intravenous Every 8 hours 03/29/21 1510 03/30/21 0834   03/29/21 1102  sodium chloride 0.9 % with cefoTEtan (CEFOTAN) ADS Med       Note to Pharmacy: Abbie Sons   : cabinet override      03/29/21 1102 03/29/21 2314   03/29/21 0600  cefoTEtan (CEFOTAN) 2 g in sodium chloride 0.9 % 100 mL IVPB  Status:  Discontinued        2 g 200 mL/hr over 30 Minutes Intravenous On call to O.R. 03/28/21 1355 03/28/21 1411   03/25/21 0600  piperacillin-tazobactam (ZOSYN) IVPB 3.375 g  Status:  Discontinued        3.375 g 100 mL/hr over 30 Minutes Intravenous Every 6 hours 03/25/21 0321 03/25/21 0323   03/25/21 0600  piperacillin-tazobactam (ZOSYN) IVPB 3.375 g  Status:  Discontinued        3.375 g 12.5 mL/hr over 240 Minutes Intravenous Every 8 hours 03/25/21 0324 03/29/21 1510   03/25/21 0130  cefTRIAXone (ROCEPHIN) 1 g in sodium chloride 0.9 % 100 mL IVPB        1 g 200 mL/hr over 30 Minutes Intravenous  Once 03/25/21 0118 03/25/21 0200      Assessment/Plan: Ms. Ewing is a 75 yo s/p Laparoscopic cholecystectomy and ERCP for choledocholithiasis and cholecystis. Doing better now after having some somnolence and needing Bipap.  Dr. Wynetta Emery going to add back some pain meds slowly Diet as tolerated, can adv H&H drifting,  hold plavix for now T bili slightly up likely reactive Will monitor labs Discussed with Dr. Wynetta Emery.    LOS: 6 days    [name] 03/31/2021

## 2021-03-31 NOTE — Progress Notes (Signed)
PROGRESS NOTE  Stacey [name] [id number] DOB: 08/25/1946 DOA: 03/24/2021 PCP: Sela Hilding   Brief History:  75 year old female with a history of coronary artery disease status post DES to the RCA December [id number], systolic & diastolic CHF, COPD, hypertension, metastatic breast cancer, hyperlipidemia, PSVT, chronic pain syndrome status post intrathecal pump placement presenting with abdominal pain that began on 03/24/2021 that was severe rating 10/10 diffuse in nature.  The patient has subjective fevers and chills.  She had nausea without emesis.  She denies any diarrhea.  She denies any headache, cough, hemoptysis, chest pain, worsening shortness of breath, hematochezia, melena, dysuria, hematuria. In the emergency department, the patient was afebrile hemodynamically stable with oxygen saturation 100% on 3 L.  CT of the abdomen and pelvis showed gallbladder wall thickening and edema with infiltration into the pericholecystic fat.  There was extrahepatic ductal dilatation with suggestion of stones in the distal CBD and moderate intrahepatic biliary ductal dilatation.  There was infiltration and thickening around the proximal duodenum probably referred from the gallbladder.  The patient was started on Zosyn.  GI and general surgery were consulted to assist.  Assessment/Plan:  Acute respiratory failure with hypercarbia - likely side effect of anesthesia -PCO2 came down with bipap therapy - CXR no acute findings - careful with opioids - continue supportive measures - ok to transfer back to telemetry  Acute cholecystitis/ POD#2 s/p Lap Chole  -General surgery consult appreciated-->lap chole 4/29 with Dr. Constance Haw -Completed IV Zosyn -advancing diet   Transaminasemia with hyperbilirubinemia -GI consult appreciated -ERCP planned 03/28/21 (allowing plavix washout) -due to cholecystitis/choledocholithiasis -ERCP was successful with 3 pigmented stones removed  Chronic  systolic and diastolic CHF -Appears clinically euvolemic -04/18/2020 echo EF 35-40%, G1DD, mild to moderate AAS, mild MR -Continue metoprolol succinate -03/25/21 Echo EF 50-55%, G1DD, no WMA, trivial MR  Chronic respiratory failure with nocturnal hypoxia -Patient is normally on 1 L nasal cannula at nighttime, now on 3L [place] -Monitor clinically -Incentive spirometry will be encouraged when off bipap therapy  Coronary artery disease -No chest pain presently -plavix and aspirin temporarily on hold, hopefully can resume soon after surgery -resume home metoprolol succinate as now she is off bipap  Chronic pain syndrome -Patient has intrathecal morphine pump infusing 5.1 mg per 24-hour. -resume home dose oxycodone and Lyrica  Metastatic breast cancer to the bone -02/19/2021 bone scan--negative for bone metastasis -Outpatient follow-up with Dr. Delton Coombes  COPD -Stable  Chronic L1/L2 compression fracture -Continue home opioid regimen  Essential hypertension -Continue metoprolol succinate   Hyperlipidemia -Holding Lipitor secondary to elevated LFTs initially  Hypokalemia -recheck Mg in AM.  Macrocytic anemia -B12 and folate within normal limits -baseline Hgb 11-12  Morbid Obesity -BMI 39.09 -lifestyle modification  Status is: Inpatient  Appropriate for inpatient due to severity of illness  Dispo: The patient is from: Home  Anticipated d/c is to: Home  Patient currently is not medically stable to d/c.              Difficult to place patient No  Family Communication:  no family present during bedside rounds  Consultants:  GI, general surgery  Code Status:  FULL   DVT Prophylaxis:  SCDs   Procedures: As Listed in Progress Note Above  Antibiotics: Zosyn 4/25>>5/1  Subjective: Patient eating and drinking, no abdominal pain, her chronic pain issues coming back, wants to restart some home meds now.       Objective: Vitals:  03/31/21 0500 03/31/21 0600 03/31/21 0700 03/31/21 0759  BP: (!) 126/50 (!) 122/53 (!) 152/48   Pulse: (!) 113 (!) 104 (!) 109   Resp: 18 19 19    Temp:    99.1 F (37.3 C)  TempSrc:    Oral  SpO2: 95% 93% 97%   Weight:      Height:        Intake/Output Summary (Last 24 hours) at 03/31/2021 0951 Last data filed at 03/31/2021 0735 Gross per 24 hour  Intake 948.09 ml  Output 350 ml  Net 598.09 ml   Weight change: -1 kg Exam:   General:  Pt is somnolent but easily arousable and follows commands appropriately, not in acute distress  HEENT: No icterus, No thrush, No neck mass, [place]/AT  Cardiovascular: irregularly irregular S1/S2, no rubs, no gallops  Respiratory: on bipap therapy.   Abdomen: persistent epigastric and RUQ  Tenderness with murphy sign, no guarding  Extremities: No edema, No lymphangitis, No petechiae, No rashes, no synovitis  Data Reviewed: I have personally reviewed following labs and imaging studies Basic Metabolic Panel: Recent Labs  Lab 03/25/21 0455 03/26/21 0700 03/27/21 0503 03/28/21 0430 03/29/21 0432 03/30/21 0415 03/31/21 0350  NA 142 144 141 143 141 143 138  K 3.6 3.6 3.5 4.0 4.1 3.5 4.1  CL 101 104 101 103 103 103 100  CO2 32 33* 30 33* 27 31 30   GLUCOSE 134* 90 95 97 151* 126* 102*  BUN 22 18 13 9 15 15 15   CREATININE 0.76 0.85 0.76 0.67 0.85 0.87 0.64  CALCIUM 9.4 9.4 9.3 9.7 9.8 9.3 8.7*  MG 1.7 1.9 1.7 1.7 1.7 1.6*  --   PHOS 4.6  --   --   --   --   --   --    Liver Function Tests: Recent Labs  Lab 03/27/21 0503 03/28/21 0430 03/29/21 0432 03/30/21 0415 03/31/21 0350  AST 87* 56* 48* 117* 72*  ALT 117* 82* 65* 96* 71*  ALKPHOS 78 84 83 76 68  BILITOT 1.4* 1.3* 1.0 1.3* 2.0*  PROT 5.6* 5.9* 6.1* 5.9* 5.6*  ALBUMIN 3.0* 3.1* 3.3* 3.1* 2.8*   Recent Labs  Lab 03/24/21 2201  LIPASE 20   No results for input(s): AMMONIA in the last 168 hours. Coagulation Profile: Recent Labs  Lab 03/25/21 0455   INR 1.0   CBC: Recent Labs  Lab 03/24/21 2201 03/25/21 0455 03/26/21 0700 03/28/21 0430 03/29/21 0432 03/30/21 0415 03/31/21 0350  WBC 11.3*   < > 4.0 4.9 4.3 8.5 7.4  NEUTROABS 8.9*  --   --  2.4 2.7 6.8  --   HGB 12.6   < > 10.6* 10.8* 11.0* 11.1* 9.7*  HCT 37.0   < > 33.1* 33.0* 33.3* 35.2* 30.0*  MCV 95.9   < > 101.8* 98.8 98.8 102.6* 101.4*  PLT 261   < > 182 189 195 166 155   < > = values in this interval not displayed.   Cardiac Enzymes: No results for input(s): CKTOTAL, CKMB, CKMBINDEX, TROPONINI in the last 168 hours. BNP: Invalid input(s): POCBNP CBG: Recent Labs  Lab 03/28/21 1420  GLUCAP 160*   HbA1C: No results for input(s): HGBA1C in the last 72 hours. Urine analysis:    Component Value Date/Time   COLORURINE AMBER (A) 03/24/2021 2201   APPEARANCEUR CLOUDY (A) 03/24/2021 2201   LABSPEC 1.038 (H) 03/24/2021 2201   PHURINE 5.0 03/24/2021 2201   GLUCOSEU 50 (A) 03/24/2021 2201  HGBUR NEGATIVE 03/24/2021 2201   BILIRUBINUR NEGATIVE 03/24/2021 2201   KETONESUR 5 (A) 03/24/2021 2201   PROTEINUR 100 (A) 03/24/2021 2201   NITRITE NEGATIVE 03/24/2021 2201   LEUKOCYTESUR NEGATIVE 03/24/2021 2201   Sepsis Labs:  Recent Results (from the past 240 hour(s))  Resp Panel by RT-PCR (Flu A&B, Covid) Nasopharyngeal Swab     Status: None   Collection Time: 03/25/21  1:48 AM   Specimen: Nasopharyngeal Swab; Nasopharyngeal(NP) swabs in vial transport medium  Result Value Ref Range Status   SARS Coronavirus 2 by RT PCR NEGATIVE NEGATIVE Final    Comment: (NOTE) SARS-CoV-2 target nucleic acids are NOT DETECTED.  The SARS-CoV-2 RNA is generally detectable in upper respiratory specimens during the acute phase of infection. The lowest concentration of SARS-CoV-2 viral copies this assay can detect is 138 copies/mL. A negative result does not preclude SARS-Cov-2 infection and should not be used as the sole basis for treatment or other patient management decisions. A  negative result may occur with  improper specimen collection/handling, submission of specimen other than nasopharyngeal swab, presence of viral mutation(s) within the areas targeted by this assay, and inadequate number of viral copies(<138 copies/mL). A negative result must be combined with clinical observations, patient history, and epidemiological information. The expected result is Negative.  Fact Sheet for Patients:  EntrepreneurPulse.com.au  Fact Sheet for Healthcare Providers:  IncredibleEmployment.be  This test is no t yet approved or cleared by the [place] FDA and  has been authorized for detection and/or diagnosis of SARS-CoV-2 by FDA under an Emergency Use Authorization (EUA). This EUA will remain  in effect (meaning this test can be used) for the duration of the COVID-19 declaration under Section 564(b)(1) of the Act, 21 U.S.C.section 360bbb-3(b)(1), unless the authorization is terminated  or revoked sooner.       Influenza A by PCR NEGATIVE NEGATIVE Final   Influenza B by PCR NEGATIVE NEGATIVE Final    Comment: (NOTE) The Xpert Xpress SARS-CoV-2/FLU/RSV plus assay is intended as an aid in the diagnosis of influenza from Nasopharyngeal swab specimens and should not be used as a sole basis for treatment. Nasal washings and aspirates are unacceptable for Xpert Xpress SARS-CoV-2/FLU/RSV testing.  Fact Sheet for Patients: EntrepreneurPulse.com.au  Fact Sheet for Healthcare Providers: IncredibleEmployment.be  This test is not yet approved or cleared by the [place] FDA and has been authorized for detection and/or diagnosis of SARS-CoV-2 by FDA under an Emergency Use Authorization (EUA). This EUA will remain in effect (meaning this test can be used) for the duration of the COVID-19 declaration under Section 564(b)(1) of the Act, 21 U.S.C. section 360bbb-3(b)(1), unless the authorization  is terminated or revoked.  Performed at [facility], [address], [place], [place] [address]   Surgical pcr screen     Status: Abnormal   Collection Time: 03/29/21  8:41 AM   Specimen: Nasal Mucosa; Nasal Swab  Result Value Ref Range Status   MRSA, PCR POSITIVE (A) NEGATIVE Final    Comment: RESULT CALLED TO, READ BACK BY AND VERIFIED WITH: TORI RN @ [id number] ON [id number] BY HENDERSON L.    Staphylococcus aureus POSITIVE (A) NEGATIVE Final    Comment: RESULT CALLED TO, READ BACK BY AND VERIFIED WITH: TORI RN @ [id number] ON [id number] BY HENDERSON L. (NOTE) The Xpert SA Assay (FDA approved for NASAL specimens in patients 69 years of age and older), is one component of a comprehensive surveillance program. It is not intended to diagnose infection nor to  guide or monitor treatment. Performed at [facility], [address], [place], [place] [address]      Scheduled Meds: . vitamin C  500 mg Oral Daily  . Chlorhexidine Gluconate Cloth  6 each Topical Daily  . docusate sodium  100 mg Oral BID  . DULoxetine  90 mg Oral Daily  . metoprolol succinate  150 mg Oral Daily  . multivitamin with minerals  1 tablet Oral Daily  . mupirocin ointment  1 application Nasal BID  . polyvinyl alcohol  1 drop Both Eyes TID  . pregabalin  50 mg Oral TID   Continuous Infusions:   Procedures/Studies: CT ABDOMEN PELVIS W CONTRAST  Result Date: 03/25/2021 CLINICAL DATA:  Acute abdominal pain. History of MI, hypertension, congestive heart failure, breast cancer. EXAM: CT ABDOMEN AND PELVIS WITH CONTRAST TECHNIQUE: Multidetector CT imaging of the abdomen and pelvis was performed using the standard protocol following bolus administration of intravenous contrast. CONTRAST:  151mL OMNIPAQUE IOHEXOL 300 MG/ML  SOLN COMPARISON:  02/19/2021 FINDINGS: Lower chest: Lung bases are clear. Hepatobiliary: Gallbladder wall thickening and edema with infiltration into the pericholecystic fat. Small stones layering in the gallbladder.  Extrahepatic bile duct dilatation with suggestion of stones in the distal common bile duct. Moderate intrahepatic bile duct dilatation. No focal liver lesions. Small amount of perihepatic ascites. Pancreas: Unremarkable. No pancreatic ductal dilatation or surrounding inflammatory changes. Spleen: Normal in size without focal abnormality. Adrenals/Urinary Tract: No adrenal gland nodules. Complex hypoenhancing lesion demonstrated in the left kidney measuring 1.9 cm diameter. This may represent a renal cell carcinoma. Similar appearance to previous study. No hydronephrosis or hydroureter. Bladder wall is thickened, probably due to under distention although cystitis could also have this appearance. Stomach/Bowel: Stomach, small bowel, and colon are not abnormally distended. Scattered colonic diverticula without evidence of diverticulitis. Infiltration and wall thickening around the proximal duodenum probably is referred inflammation from the gallbladder. Duodenitis could also be present. Appendix is not identified. Vascular/Lymphatic: Aortic atherosclerosis. No enlarged abdominal or pelvic lymph nodes. Reproductive: Uterus is surgically absent. No abnormal adnexal masses. Other: No free air in the abdomen. Moderate-sized periumbilical hernia containing fat. Musculoskeletal: Degenerative changes in the spine. Lumbar scoliosis convex towards the left. Sclerotic bone lesions consistent with metastatic disease and vertebral compression of L2. IMPRESSION: 1. Cholelithiasis with changes of acute cholecystitis. Bile duct dilatation with probable stones in the distal common bile duct. 2. Infiltration and wall thickening around the proximal duodenum probably is referred inflammation from the gallbladder. Duodenitis could also have this appearance. 3. Complex hypoenhancing lesion in the left kidney may represent a renal cell carcinoma. No change since prior study. 4. Sclerotic bone lesions consistent with metastatic disease.  Compression of L2. 5. Moderate-sized periumbilical hernia containing fat. 6. Aortic atherosclerosis. Aortic Atherosclerosis (ICD10-I70.0). Electronically Signed   By: Lucienne Capers M.D.   On: 03/25/2021 00:50   DG CHEST PORT 1 VIEW  Result Date: 03/30/2021 CLINICAL DATA:  Acute on chronic respiratory failure.  Hypercapnia. EXAM: PORTABLE CHEST 1 VIEW COMPARISON:  November 15, 2019 FINDINGS: The heart size and mediastinal contours are within normal limits. Both lungs are clear. The visualized skeletal structures are unremarkable. IMPRESSION: No active disease. Electronically Signed   By: Dorise Bullion III M.D   On: 03/30/2021 08:52   DG ERCP BILIARY & PANCREATIC DUCTS  Result Date: 03/28/2021 CLINICAL DATA:  Choledocholithiasis EXAM: ERCP TECHNIQUE: Multiple spot images obtained with the fluoroscopic device and submitted for interpretation post-procedure. COMPARISON:  CT abdomen pelvis-03/25/2021 FLUOROSCOPY TIME:  2 minutes, 2 seconds (39.4 mGy) FINDINGS: Multiple spot fluoroscopic images the right upper abdominal quadrant during ERCP are provided for review. Initial image demonstrates an ERCP probe overlying the right upper abdominal quadrant. Subsequent images demonstrate selective cannulation opacification of the common duct which appears moderately dilated. There are several transient nonocclusive filling defects within the CBD which could represent air bubbles versus nonocclusive choledocholithiasis. Subsequent images demonstrate insufflation of a balloon within the central aspect of the CBD with subsequent biliary sweeping and presumed stone extraction and sphincterotomy There is minimal opacification of the central aspect of the intrahepatic biliary tree which appears mildly dilated. There is faint opacification of the gallbladder lumen. There is no definitive opacification of the pancreatic duct. IMPRESSION: ERCP with biliary sweeping and presumed stone extraction and sphincterotomy as above.  These images were submitted for radiologic interpretation only. Please see the procedural report for the amount of contrast and the fluoroscopy time utilized. Electronically Signed   By: Sandi Mariscal M.D.   On: 03/28/2021 14:23   ECHOCARDIOGRAM COMPLETE  Result Date: 03/25/2021    ECHOCARDIOGRAM REPORT   Patient Name:   Stacey [name] Date of Exam: 03/25/2021 Medical Rec #:  [id number]         Height:       60.0 in Accession #:    [id number]        Weight:       200.2 lb Date of Birth:  May 22, 1946          BSA:          1.867 m Patient Age:    66 years          BP:           141/72 mmHg Patient Gender: F                 HR:           71 bpm. Exam Location:  [facility] Procedure: 2D Echo, Cardiac Doppler and Color Doppler Indications:    CHF  History:        Patient has prior history of Echocardiogram examinations, most                 recent 04/18/2020. CHF, CAD, COPD, Aortic Valve Disease and                 Aortic stenosis; Risk Factors:Hypertension, Dyslipidemia and                 morbid obestiy. Metastatic breast cancer.  Sonographer:    Dustin Flock RDCS Referring Phys: [phone] DAVID TAT IMPRESSIONS  1. Left ventricular ejection fraction, by estimation, is 50 to 55%. The left ventricle has low normal function. The left ventricle has no regional wall motion abnormalities. Left ventricular diastolic parameters are consistent with Grade I diastolic dysfunction (impaired relaxation).  2. Right ventricular systolic function is normal. The right ventricular size is normal. There is mildly elevated pulmonary artery systolic pressure. The estimated right ventricular systolic pressure is 0000000 mmHg.  3. Left atrial size was upper normal.  4. The mitral valve is grossly normal. Trivial mitral valve regurgitation.  5. The aortic valve is tricuspid with some fusion of left and noncoronary cusps. There is moderate calcification of the aortic valve. Aortic valve regurgitation is not visualized. Moderate aortic valve  stenosis. Aortic valve mean gradient measures 10.5 mmHg. Dimentionless index 0.31.  6. The inferior vena cava is normal in size with greater than 50% respiratory  variability, suggesting right atrial pressure of 3 mmHg. FINDINGS  Left Ventricle: Left ventricular ejection fraction, by estimation, is 50 to 55%. The left ventricle has low normal function. The left ventricle has no regional wall motion abnormalities. The left ventricular internal cavity size was normal in size. There is borderline left ventricular hypertrophy. Abnormal (paradoxical) septal motion, consistent with left bundle branch block. Left ventricular diastolic parameters are consistent with Grade I diastolic dysfunction (impaired relaxation). Right Ventricle: The right ventricular size is normal. No increase in right ventricular wall thickness. Right ventricular systolic function is normal. There is mildly elevated pulmonary artery systolic pressure. The tricuspid regurgitant velocity is 3.24  m/s, and with an assumed right atrial pressure of 3 mmHg, the estimated right ventricular systolic pressure is 63.0 mmHg. Left Atrium: Left atrial size was upper normal. Right Atrium: Right atrial size was normal in size. Pericardium: There is no evidence of pericardial effusion. Mitral Valve: The mitral valve is grossly normal. Mild mitral annular calcification. Trivial mitral valve regurgitation. Tricuspid Valve: The tricuspid valve is grossly normal. Tricuspid valve regurgitation is mild. Aortic Valve: The aortic valve is tricuspid. There is moderate calcification of the aortic valve. There is moderate aortic valve annular calcification. Aortic valve regurgitation is not visualized. Moderate aortic stenosis is present. Aortic valve mean gradient measures 10.5 mmHg. Aortic valve peak gradient measures 18.1 mmHg. Aortic valve area, by VTI measures 1.09 cm. Pulmonic Valve: The pulmonic valve was grossly normal. Pulmonic valve regurgitation is trivial. Aorta:  The aortic root is normal in size and structure. Venous: The inferior vena cava is normal in size with greater than 50% respiratory variability, suggesting right atrial pressure of 3 mmHg. IAS/Shunts: No atrial level shunt detected by color flow Doppler.  LEFT VENTRICLE PLAX 2D LVIDd:         4.35 cm  Diastology LVIDs:         3.18 cm  LV e' medial:    5.44 cm/s LV PW:         1.05 cm  LV E/e' medial:  11.7 LV IVS:        1.08 cm  LV e' lateral:   7.40 cm/s LVOT diam:     2.10 cm  LV E/e' lateral: 8.6 LV SV:         52 LV SV Index:   28 LVOT Area:     3.46 cm  RIGHT VENTRICLE RV Basal diam:  3.00 cm RV S prime:     7.07 cm/s TAPSE (M-mode): 2.1 cm LEFT ATRIUM             Index       RIGHT ATRIUM           Index LA diam:        3.80 cm 2.04 cm/m  RA Area:     11.70 cm LA Vol (A2C):   37.5 ml 20.08 ml/m RA Volume:   32.00 ml  17.14 ml/m LA Vol (A4C):   60.0 ml 32.13 ml/m LA Biplane Vol: 50.8 ml 27.20 ml/m  AORTIC VALVE AV Area (Vmax):    1.10 cm AV Area (Vmean):   1.16 cm AV Area (VTI):     1.09 cm AV Vmax:           213.00 cm/s AV Vmean:          149.000 cm/s AV VTI:            0.477 m AV Peak Grad:  18.1 mmHg AV Mean Grad:      10.5 mmHg LVOT Vmax:         67.90 cm/s LVOT Vmean:        50.000 cm/s LVOT VTI:          0.150 m LVOT/AV VTI ratio: 0.31  AORTA Ao Root diam: 2.70 cm MITRAL VALVE               TRICUSPID VALVE MV Area (PHT): 2.68 cm    TR Peak grad:   42.0 mmHg MV Decel Time: 283 msec    TR Vmax:        324.00 cm/s MV E velocity: 63.80 cm/s MV A velocity: 89.50 cm/s  SHUNTS MV E/A ratio:  0.71        Systemic VTI:  0.15 m                            Systemic Diam: 2.10 cm Rozann Lesches MD Electronically signed by Rozann Lesches MD Signature Date/Time: 03/25/2021/2:24:21 PM    Final    Irwin Brakeman, MD  [facility]  If 7PM-7AM, please contact night-coverage [email or web address] Password [email or web address] 03/31/2021, 9:51 AM   LOS: 6 days

## 2021-03-31 NOTE — Progress Notes (Addendum)
Took Bipap to patient's room to run as CPAP tonight but patient refused, patient complaining about her nose being sore, so I pulled it out of room to clean.  Last night I went ahead and put patient on Bipap but patient kept complaining about feeling claustrophobic when mask was on and patient started crying.  I told patient to try and focus on her movie while she was on machine or focus on other things.  Was told by staff she did not stay on it for very long.   If patient changes her mind, we can set her up on dreamstation and try again if patient is willing.

## 2021-04-01 ENCOUNTER — Encounter (HOSPITAL_COMMUNITY): Payer: Self-pay | Admitting: Internal Medicine

## 2021-04-01 LAB — COMPREHENSIVE METABOLIC PANEL
ALT: 47 U/L — ABNORMAL HIGH (ref 0–44)
AST: 40 U/L (ref 15–41)
Albumin: 2.4 g/dL — ABNORMAL LOW (ref 3.5–5.0)
Alkaline Phosphatase: 64 U/L (ref 38–126)
Anion gap: 6 (ref 5–15)
BUN: 12 mg/dL (ref 8–23)
CO2: 31 mmol/L (ref 22–32)
Calcium: 8.7 mg/dL — ABNORMAL LOW (ref 8.9–10.3)
Chloride: 101 mmol/L (ref 98–111)
Creatinine, Ser: 0.59 mg/dL (ref 0.44–1.00)
GFR, Estimated: >60 mL/min (ref >60)
Glucose, Bld: 112 mg/dL — ABNORMAL HIGH (ref 70–99)
Potassium: 4 mmol/L (ref 3.5–5.1)
Sodium: 138 mmol/L (ref 135–145)
Total Bilirubin: 0.9 mg/dL (ref 0.3–1.2)
Total Protein: 5.2 g/dL — ABNORMAL LOW (ref 6.5–8.1)

## 2021-04-01 LAB — CBC
HCT: 27 % — ABNORMAL LOW (ref 36.0–46.0)
Hemoglobin: 8.8 g/dL — ABNORMAL LOW (ref 12.0–15.0)
MCH: 33 pg (ref 26.0–34.0)
MCHC: 32.6 g/dL (ref 30.0–36.0)
MCV: 101.1 fL — ABNORMAL HIGH (ref 80.0–100.0)
Platelets: 146 10*3/uL — ABNORMAL LOW (ref 150–400)
RBC: 2.67 MIL/uL — ABNORMAL LOW (ref 3.87–5.11)
RDW: 14 % (ref 11.5–15.5)
WBC: 6.2 10*3/uL (ref 4.0–10.5)
nRBC: 0 % (ref 0.0–0.2)

## 2021-04-01 LAB — SURGICAL PATHOLOGY

## 2021-04-01 LAB — MAGNESIUM: Magnesium: 1.7 mg/dL (ref 1.7–2.4)

## 2021-04-01 MED ORDER — MAGNESIUM SULFATE 4 GM/100ML IV SOLN
4.0000 g | Freq: Once | INTRAVENOUS | Status: AC
  Administered 2021-04-01: 4 g via INTRAVENOUS
  Filled 2021-04-01: qty 100

## 2021-04-01 NOTE — Progress Notes (Signed)
[facility]  Overall improving with mentation and tolerating diet.   H&H still drifting, likely some post op bleed and equilibration. Will discuss with Dr. Wynetta Emery would hold plavix until levels out.  Curlene Labrum, MD [facility] [address] [place], [place] [address] [phone] (office)

## 2021-04-01 NOTE — Progress Notes (Signed)
PROGRESS NOTE  Stacey [name] PXT:[id number] DOB: [birth year] DOA: 03/24/2021 PCP: Sela Hilding   Brief History:  75 year old female with a history of coronary artery disease status post DES to the RCA December 6270, systolic & diastolic CHF, COPD, hypertension, metastatic breast cancer, hyperlipidemia, PSVT, chronic pain syndrome status post intrathecal pump placement presenting with abdominal pain that began on 03/24/2021 that was severe rating 10/10 diffuse in nature.  The patient has subjective fevers and chills.  She had nausea without emesis.  She denies any diarrhea.  She denies any headache, cough, hemoptysis, chest pain, worsening shortness of breath, hematochezia, melena, dysuria, hematuria. In the emergency department, the patient was afebrile hemodynamically stable with oxygen saturation 100% on 3 L.  CT of the abdomen and pelvis showed gallbladder wall thickening and edema with infiltration into the pericholecystic fat.  There was extrahepatic ductal dilatation with suggestion of stones in the distal CBD and moderate intrahepatic biliary ductal dilatation.  There was infiltration and thickening around the proximal duodenum probably referred from the gallbladder.  The patient was started on Zosyn.  GI and general surgery were consulted to assist.  Assessment/Plan:  Acute respiratory failure with hypercarbia - resolved - likely side effect of anesthesia -PCO2 came down with bipap therapy - CXR no acute findings - careful with opioids - continue supportive measures   Acute cholecystitis/ POD#3 s/p Lap Chole  -General surgery consult appreciated-->lap chole 4/29 with Dr. Constance Haw -Completed IV Zosyn -advancing diet to soft diet  Transaminasemia with hyperbilirubinemia -GI consult appreciated -ERCP planned 03/28/21 (allowing plavix washout) -due to cholecystitis/choledocholithiasis -ERCP was successful with 3 pigmented stones removed  Chronic systolic and  diastolic CHF -Appears clinically euvolemic -04/18/2020 echo EF 35-40%, G1DD, mild to moderate AAS, mild MR -Continue metoprolol succinate -03/25/21 Echo EF 50-55%, G1DD, no WMA, trivial MR  Chronic respiratory failure with nocturnal hypoxia -Patient is normally on 1 L nasal cannula at nighttime, now on room air -Monitor clinically -Incentive spirometry  Coronary artery disease -No chest pain presently -plavix and aspirin temporarily on hold, resume when Hg stabilized postop -resumed home metoprolol succinate  Chronic pain syndrome -Patient has intrathecal morphine pump infusing 5.1 mg per 24-hour. -resume home dose oxycodone and Lyrica  Metastatic breast cancer to the bone -02/19/2021 bone scan--negative for bone metastasis -Outpatient follow-up with Dr. Delton Coombes  COPD -Stable  Chronic L1/L2 compression fracture -Continue home opioid regimen  Essential hypertension -Continue metoprolol succinate   Hyperlipidemia -Holding Lipitor secondary to elevated LFTs initially  Hypokalemia -repleted  Hypomagnesemia - IV replacement ordered  Macrocytic anemia -B12 and folate within normal limits -baseline Hgb 11-12  Morbid Obesity -BMI 39.09 -lifestyle modification  Status is: Inpatient  Appropriate for inpatient due to severity of illness  Dispo: The patient is from: Home  Anticipated d/c is to: Home  Patient currently is not medically stable to d/c.              Difficult to place patient No  Family Communication:  no family present during bedside rounds  Consultants:  GI, general surgery  Code Status:  FULL   DVT Prophylaxis:  SCDs   Procedures: As Listed in Progress Note Above  Antibiotics: Zosyn 4/25>>5/1  Subjective: Patient without complaint this morning.      Objective: Vitals:   04/01/21 0145 04/01/21 0524 04/01/21 0907 04/01/21 1402  BP: (!) 142/65 (!) 144/79 (!) 137/58 (!) 153/63  Pulse: 95  92 94 90  Resp: 18  19  18  Temp: 98.3 F (36.8 C) 97.6 F (36.4 C)  97.8 F (36.6 C)  TempSrc: Oral   Oral  SpO2: 96% 90% 96% 98%  Weight:      Height:        Intake/Output Summary (Last 24 hours) at 04/01/2021 1523 Last data filed at 04/01/2021 1300 Gross per 24 hour  Intake 1320 ml  Output 200 ml  Net 1120 ml   Weight change:  Exam:   General:  Pt is easily arousable and follows commands appropriately, not in acute distress  HEENT: No icterus, No thrush, No neck mass, [name]  Cardiovascular: irregularly irregular S1/S2, no rubs, no gallops  Respiratory: on bipap therapy.   Abdomen: nontender, no guarding  Extremities: No edema, No lymphangitis, No petechiae, No rashes, no synovitis  Data Reviewed: I have personally reviewed following labs and imaging studies Basic Metabolic Panel: Recent Labs  Lab 03/27/21 0503 03/28/21 0430 03/29/21 0432 03/30/21 0415 03/31/21 0350 04/01/21 0509  NA 141 143 141 143 138 138  K 3.5 4.0 4.1 3.5 4.1 4.0  CL 101 103 103 103 100 101  CO2 30 33* 27 31 30 31   GLUCOSE 95 97 151* 126* 102* 112*  BUN 13 9 15 15 15 12   CREATININE 0.76 0.67 0.85 0.87 0.64 0.59  CALCIUM 9.3 9.7 9.8 9.3 8.7* 8.7*  MG 1.7 1.7 1.7 1.6*  --  1.7   Liver Function Tests: Recent Labs  Lab 03/28/21 0430 03/29/21 0432 03/30/21 0415 03/31/21 0350 04/01/21 0509  AST 56* 48* 117* 72* 40  ALT 82* 65* 96* 71* 47*  ALKPHOS 84 83 76 68 64  BILITOT 1.3* 1.0 1.3* 2.0* 0.9  PROT 5.9* 6.1* 5.9* 5.6* 5.2*  ALBUMIN 3.1* 3.3* 3.1* 2.8* 2.4*   No results for input(s): LIPASE, AMYLASE in the last 168 hours. No results for input(s): AMMONIA in the last 168 hours. Coagulation Profile: No results for input(s): INR, PROTIME in the last 168 hours. CBC: Recent Labs  Lab 03/28/21 0430 03/29/21 0432 03/30/21 0415 03/31/21 0350 04/01/21 0509  WBC 4.9 4.3 8.5 7.4 6.2  NEUTROABS 2.4 2.7 6.8  --   --   HGB 10.8* 11.0* 11.1* 9.7* 8.8*  HCT 33.0* 33.3* 35.2* 30.0*  27.0*  MCV 98.8 98.8 102.6* 101.4* 101.1*  PLT 189 195 166 155 146*   Cardiac Enzymes: No results for input(s): CKTOTAL, CKMB, CKMBINDEX, TROPONINI in the last 168 hours. BNP: Invalid input(s): POCBNP CBG: Recent Labs  Lab 03/28/21 1420  GLUCAP 160*   HbA1C: No results for input(s): HGBA1C in the last 72 hours. Urine analysis:    Component Value Date/Time   COLORURINE AMBER (A) 03/24/2021 2201   APPEARANCEUR CLOUDY (A) 03/24/2021 2201   LABSPEC 1.038 (H) 03/24/2021 2201   PHURINE 5.0 03/24/2021 2201   GLUCOSEU 50 (A) 03/24/2021 2201   HGBUR NEGATIVE 03/24/2021 2201   BILIRUBINUR NEGATIVE 03/24/2021 2201   KETONESUR 5 (A) 03/24/2021 2201   PROTEINUR 100 (A) 03/24/2021 2201   NITRITE NEGATIVE 03/24/2021 2201   LEUKOCYTESUR NEGATIVE 03/24/2021 2201   Sepsis Labs:  Recent Results (from the past 240 hour(s))  Resp Panel by RT-PCR (Flu A&B, Covid) Nasopharyngeal Swab     Status: None   Collection Time: 03/25/21  1:48 AM   Specimen: Nasopharyngeal Swab; Nasopharyngeal(NP) swabs in vial transport medium  Result Value Ref Range Status   SARS Coronavirus 2 by RT PCR NEGATIVE NEGATIVE Final    Comment: (NOTE) SARS-CoV-2 target nucleic acids  are NOT DETECTED.  The SARS-CoV-2 RNA is generally detectable in upper respiratory specimens during the acute phase of infection. The lowest concentration of SARS-CoV-2 viral copies this assay can detect is 138 copies/mL. A negative result does not preclude SARS-Cov-2 infection and should not be used as the sole basis for treatment or other patient management decisions. A negative result may occur with  improper specimen collection/handling, submission of specimen other than nasopharyngeal swab, presence of viral mutation(s) within the areas targeted by this assay, and inadequate number of viral copies(<138 copies/mL). A negative result must be combined with clinical observations, patient history, and epidemiological information. The  expected result is Negative.  Fact Sheet for Patients:  EntrepreneurPulse.com.au  Fact Sheet for Healthcare Providers:  IncredibleEmployment.be  This test is no t yet approved or cleared by the [place] FDA and  has been authorized for detection and/or diagnosis of SARS-CoV-2 by FDA under an Emergency Use Authorization (EUA). This EUA will remain  in effect (meaning this test can be used) for the duration of the COVID-19 declaration under Section 564(b)(1) of the Act, 21 U.S.C.section 360bbb-3(b)(1), unless the authorization is terminated  or revoked sooner.       Influenza A by PCR NEGATIVE NEGATIVE Final   Influenza B by PCR NEGATIVE NEGATIVE Final    Comment: (NOTE) The Xpert Xpress SARS-CoV-2/FLU/RSV plus assay is intended as an aid in the diagnosis of influenza from Nasopharyngeal swab specimens and should not be used as a sole basis for treatment. Nasal washings and aspirates are unacceptable for Xpert Xpress SARS-CoV-2/FLU/RSV testing.  Fact Sheet for Patients: EntrepreneurPulse.com.au  Fact Sheet for Healthcare Providers: IncredibleEmployment.be  This test is not yet approved or cleared by the [place] FDA and has been authorized for detection and/or diagnosis of SARS-CoV-2 by FDA under an Emergency Use Authorization (EUA). This EUA will remain in effect (meaning this test can be used) for the duration of the COVID-19 declaration under Section 564(b)(1) of the Act, 21 U.S.C. section 360bbb-3(b)(1), unless the authorization is terminated or revoked.  Performed at [facility], [address] [place], [place] [address]   Surgical pcr screen     Status: Abnormal   Collection Time: 03/29/21  8:41 AM   Specimen: Nasal Mucosa; Nasal Swab  Result Value Ref Range Status   MRSA, PCR POSITIVE (A) NEGATIVE Final    Comment: RESULT CALLED TO, READ BACK BY AND VERIFIED WITH: TORI RN @ 1118 ON  [id number] BY HENDERSON L.    Staphylococcus aureus POSITIVE (A) NEGATIVE Final    Comment: RESULT CALLED TO, READ BACK BY AND VERIFIED WITH: TORI RN @ [id number] ON [id number] BY HENDERSON L. (NOTE) The Xpert SA Assay (FDA approved for NASAL specimens in patients 3 years of age and older), is one component of a comprehensive surveillance program. It is not intended to diagnose infection nor to guide or monitor treatment. Performed at [facility], [address], [place], [place] [address]      Scheduled Meds: . vitamin C  500 mg Oral Daily  . Chlorhexidine Gluconate Cloth  6 each Topical Daily  . docusate sodium  100 mg Oral BID  . DULoxetine  90 mg Oral Daily  . metoprolol succinate  150 mg Oral Daily  . multivitamin with minerals  1 tablet Oral Daily  . mupirocin ointment  1 application Nasal BID  . polyvinyl alcohol  1 drop Both Eyes TID  . pregabalin  50 mg Oral TID   Continuous Infusions:  Procedures/Studies: CT ABDOMEN PELVIS W CONTRAST  Result Date: 03/25/2021 CLINICAL DATA:  Acute abdominal pain. History of MI, hypertension, congestive heart failure, breast cancer. EXAM: CT ABDOMEN AND PELVIS WITH CONTRAST TECHNIQUE: Multidetector CT imaging of the abdomen and pelvis was performed using the standard protocol following bolus administration of intravenous contrast. CONTRAST:  131mL OMNIPAQUE IOHEXOL 300 MG/ML  SOLN COMPARISON:  02/19/2021 FINDINGS: Lower chest: Lung bases are clear. Hepatobiliary: Gallbladder wall thickening and edema with infiltration into the pericholecystic fat. Small stones layering in the gallbladder. Extrahepatic bile duct dilatation with suggestion of stones in the distal common bile duct. Moderate intrahepatic bile duct dilatation. No focal liver lesions. Small amount of perihepatic ascites. Pancreas: Unremarkable. No pancreatic ductal dilatation or surrounding inflammatory changes. Spleen: Normal in size without focal abnormality. Adrenals/Urinary Tract: No adrenal  gland nodules. Complex hypoenhancing lesion demonstrated in the left kidney measuring 1.9 cm diameter. This may represent a renal cell carcinoma. Similar appearance to previous study. No hydronephrosis or hydroureter. Bladder wall is thickened, probably due to under distention although cystitis could also have this appearance. Stomach/Bowel: Stomach, small bowel, and colon are not abnormally distended. Scattered colonic diverticula without evidence of diverticulitis. Infiltration and wall thickening around the proximal duodenum probably is referred inflammation from the gallbladder. Duodenitis could also be present. Appendix is not identified. Vascular/Lymphatic: Aortic atherosclerosis. No enlarged abdominal or pelvic lymph nodes. Reproductive: Uterus is surgically absent. No abnormal adnexal masses. Other: No free air in the abdomen. Moderate-sized periumbilical hernia containing fat. Musculoskeletal: Degenerative changes in the spine. Lumbar scoliosis convex towards the left. Sclerotic bone lesions consistent with metastatic disease and vertebral compression of L2. IMPRESSION: 1. Cholelithiasis with changes of acute cholecystitis. Bile duct dilatation with probable stones in the distal common bile duct. 2. Infiltration and wall thickening around the proximal duodenum probably is referred inflammation from the gallbladder. Duodenitis could also have this appearance. 3. Complex hypoenhancing lesion in the left kidney may represent a renal cell carcinoma. No change since prior study. 4. Sclerotic bone lesions consistent with metastatic disease. Compression of L2. 5. Moderate-sized periumbilical hernia containing fat. 6. Aortic atherosclerosis. Aortic Atherosclerosis (ICD10-I70.0). Electronically Signed   By: Lucienne Capers M.D.   On: 03/25/2021 00:50   DG CHEST PORT 1 VIEW  Result Date: 03/30/2021 CLINICAL DATA:  Acute on chronic respiratory failure.  Hypercapnia. EXAM: PORTABLE CHEST 1 VIEW COMPARISON:   November 15, 2019 FINDINGS: The heart size and mediastinal contours are within normal limits. Both lungs are clear. The visualized skeletal structures are unremarkable. IMPRESSION: No active disease. Electronically Signed   By: Dorise Bullion III M.D   On: 03/30/2021 08:52   DG ERCP BILIARY & PANCREATIC DUCTS  Result Date: 03/28/2021 CLINICAL DATA:  Choledocholithiasis EXAM: ERCP TECHNIQUE: Multiple spot images obtained with the fluoroscopic device and submitted for interpretation post-procedure. COMPARISON:  CT abdomen pelvis-03/25/2021 FLUOROSCOPY TIME:  2 minutes, 2 seconds (39.4 mGy) FINDINGS: Multiple spot fluoroscopic images the right upper abdominal quadrant during ERCP are provided for review. Initial image demonstrates an ERCP probe overlying the right upper abdominal quadrant. Subsequent images demonstrate selective cannulation opacification of the common duct which appears moderately dilated. There are several transient nonocclusive filling defects within the CBD which could represent air bubbles versus nonocclusive choledocholithiasis. Subsequent images demonstrate insufflation of a balloon within the central aspect of the CBD with subsequent biliary sweeping and presumed stone extraction and sphincterotomy There is minimal opacification of the central aspect of the intrahepatic biliary tree which appears mildly dilated. There  is faint opacification of the gallbladder lumen. There is no definitive opacification of the pancreatic duct. IMPRESSION: ERCP with biliary sweeping and presumed stone extraction and sphincterotomy as above. These images were submitted for radiologic interpretation only. Please see the procedural report for the amount of contrast and the fluoroscopy time utilized. Electronically Signed   By: Sandi Mariscal M.D.   On: 03/28/2021 14:23   ECHOCARDIOGRAM COMPLETE  Result Date: 03/25/2021    ECHOCARDIOGRAM REPORT   Patient Name:   Stacey [name] Date of Exam: 03/25/2021 Medical  Rec #:  [id number]         Height:       60.0 in Accession #:    [id number]        Weight:       200.2 lb Date of Birth:  23-Apr-1946          BSA:          1.867 m Patient Age:    32 years          BP:           141/72 mmHg Patient Gender: F                 HR:           71 bpm. Exam Location:  [facility] Procedure: 2D Echo, Cardiac Doppler and Color Doppler Indications:    CHF  History:        Patient has prior history of Echocardiogram examinations, most                 recent 04/18/2020. CHF, CAD, COPD, Aortic Valve Disease and                 Aortic stenosis; Risk Factors:Hypertension, Dyslipidemia and                 morbid obestiy. Metastatic breast cancer.  Sonographer:    Dustin Flock RDCS Referring Phys: [phone] DAVID TAT IMPRESSIONS  1. Left ventricular ejection fraction, by estimation, is 50 to 55%. The left ventricle has low normal function. The left ventricle has no regional wall motion abnormalities. Left ventricular diastolic parameters are consistent with Grade I diastolic dysfunction (impaired relaxation).  2. Right ventricular systolic function is normal. The right ventricular size is normal. There is mildly elevated pulmonary artery systolic pressure. The estimated right ventricular systolic pressure is 32.9 mmHg.  3. Left atrial size was upper normal.  4. The mitral valve is grossly normal. Trivial mitral valve regurgitation.  5. The aortic valve is tricuspid with some fusion of left and noncoronary cusps. There is moderate calcification of the aortic valve. Aortic valve regurgitation is not visualized. Moderate aortic valve stenosis. Aortic valve mean gradient measures 10.5 mmHg. Dimentionless index 0.31.  6. The inferior vena cava is normal in size with greater than 50% respiratory variability, suggesting right atrial pressure of 3 mmHg. FINDINGS  Left Ventricle: Left ventricular ejection fraction, by estimation, is 50 to 55%. The left ventricle has low normal function. The left ventricle has no  regional wall motion abnormalities. The left ventricular internal cavity size was normal in size. There is borderline left ventricular hypertrophy. Abnormal (paradoxical) septal motion, consistent with left bundle branch block. Left ventricular diastolic parameters are consistent with Grade I diastolic dysfunction (impaired relaxation). Right Ventricle: The right ventricular size is normal. No increase in right ventricular wall thickness. Right ventricular systolic function is normal. There is mildly elevated pulmonary artery systolic pressure. The tricuspid  regurgitant velocity is 3.24  m/s, and with an assumed right atrial pressure of 3 mmHg, the estimated right ventricular systolic pressure is 0000000 mmHg. Left Atrium: Left atrial size was upper normal. Right Atrium: Right atrial size was normal in size. Pericardium: There is no evidence of pericardial effusion. Mitral Valve: The mitral valve is grossly normal. Mild mitral annular calcification. Trivial mitral valve regurgitation. Tricuspid Valve: The tricuspid valve is grossly normal. Tricuspid valve regurgitation is mild. Aortic Valve: The aortic valve is tricuspid. There is moderate calcification of the aortic valve. There is moderate aortic valve annular calcification. Aortic valve regurgitation is not visualized. Moderate aortic stenosis is present. Aortic valve mean gradient measures 10.5 mmHg. Aortic valve peak gradient measures 18.1 mmHg. Aortic valve area, by VTI measures 1.09 cm. Pulmonic Valve: The pulmonic valve was grossly normal. Pulmonic valve regurgitation is trivial. Aorta: The aortic root is normal in size and structure. Venous: The inferior vena cava is normal in size with greater than 50% respiratory variability, suggesting right atrial pressure of 3 mmHg. IAS/Shunts: No atrial level shunt detected by color flow Doppler.  LEFT VENTRICLE PLAX 2D LVIDd:         4.35 cm  Diastology LVIDs:         3.18 cm  LV e' medial:    5.44 cm/s LV PW:          1.05 cm  LV E/e' medial:  11.7 LV IVS:        1.08 cm  LV e' lateral:   7.40 cm/s LVOT diam:     2.10 cm  LV E/e' lateral: 8.6 LV SV:         52 LV SV Index:   28 LVOT Area:     3.46 cm  RIGHT VENTRICLE RV Basal diam:  3.00 cm RV S prime:     7.07 cm/s TAPSE (M-mode): 2.1 cm LEFT ATRIUM             Index       RIGHT ATRIUM           Index LA diam:        3.80 cm 2.04 cm/m  RA Area:     11.70 cm LA Vol (A2C):   37.5 ml 20.08 ml/m RA Volume:   32.00 ml  17.14 ml/m LA Vol (A4C):   60.0 ml 32.13 ml/m LA Biplane Vol: 50.8 ml 27.20 ml/m  AORTIC VALVE AV Area (Vmax):    1.10 cm AV Area (Vmean):   1.16 cm AV Area (VTI):     1.09 cm AV Vmax:           213.00 cm/s AV Vmean:          149.000 cm/s AV VTI:            0.477 m AV Peak Grad:      18.1 mmHg AV Mean Grad:      10.5 mmHg LVOT Vmax:         67.90 cm/s LVOT Vmean:        50.000 cm/s LVOT VTI:          0.150 m LVOT/AV VTI ratio: 0.31  AORTA Ao Root diam: 2.70 cm MITRAL VALVE               TRICUSPID VALVE MV Area (PHT): 2.68 cm    TR Peak grad:   42.0 mmHg MV Decel Time: 283 msec    TR Vmax:  324.00 cm/s MV E velocity: 63.80 cm/s MV A velocity: 89.50 cm/s  SHUNTS MV E/A ratio:  0.71        Systemic VTI:  0.15 m                            Systemic Diam: 2.10 cm Rozann Lesches MD Electronically signed by Rozann Lesches MD Signature Date/Time: 03/25/2021/2:24:21 PM    Final    Irwin Brakeman, MD  [facility]  If 7PM-7AM, please contact night-coverage [email or web address] Password [email or web address] 04/01/2021, 3:23 PM   LOS: 7 days

## 2021-04-02 ENCOUNTER — Inpatient Hospital Stay (HOSPITAL_COMMUNITY): Payer: Medicare Other

## 2021-04-02 LAB — CBC WITH DIFFERENTIAL/PLATELET
Abs Immature Granulocytes: 0.03 10*3/uL (ref 0.00–0.07)
Basophils Absolute: 0 10*3/uL (ref 0.0–0.1)
Basophils Relative: 0 %
Eosinophils Absolute: 0.2 10*3/uL (ref 0.0–0.5)
Eosinophils Relative: 5 %
HCT: 25.8 % — ABNORMAL LOW (ref 36.0–46.0)
Hemoglobin: 8.3 g/dL — ABNORMAL LOW (ref 12.0–15.0)
Immature Granulocytes: 1 %
Lymphocytes Relative: 23 %
Lymphs Abs: 1.2 10*3/uL (ref 0.7–4.0)
MCH: 32.5 pg (ref 26.0–34.0)
MCHC: 32.2 g/dL (ref 30.0–36.0)
MCV: 101.2 fL — ABNORMAL HIGH (ref 80.0–100.0)
Monocytes Absolute: 0.4 10*3/uL (ref 0.1–1.0)
Monocytes Relative: 8 %
Neutro Abs: 3.1 10*3/uL (ref 1.7–7.7)
Neutrophils Relative %: 63 %
Platelets: 162 10*3/uL (ref 150–400)
RBC: 2.55 MIL/uL — ABNORMAL LOW (ref 3.87–5.11)
RDW: 13.9 % (ref 11.5–15.5)
WBC: 5 10*3/uL (ref 4.0–10.5)
nRBC: 0 % (ref 0.0–0.2)

## 2021-04-02 LAB — BASIC METABOLIC PANEL
Anion gap: 6 (ref 5–15)
BUN: 11 mg/dL (ref 8–23)
CO2: 32 mmol/L (ref 22–32)
Calcium: 8.6 mg/dL — ABNORMAL LOW (ref 8.9–10.3)
Chloride: 100 mmol/L (ref 98–111)
Creatinine, Ser: 0.59 mg/dL (ref 0.44–1.00)
GFR, Estimated: >60 mL/min (ref >60)
Glucose, Bld: 115 mg/dL — ABNORMAL HIGH (ref 70–99)
Potassium: 3.5 mmol/L (ref 3.5–5.1)
Sodium: 138 mmol/L (ref 135–145)

## 2021-04-02 LAB — LACTIC ACID, PLASMA: Lactic Acid, Venous: 0.9 mmol/L (ref 0.5–1.9)

## 2021-04-02 MED ORDER — CLOPIDOGREL BISULFATE 75 MG PO TABS
75.0000 mg | ORAL_TABLET | Freq: Every day | ORAL | Status: DC
  Filled 2021-04-02: qty 1

## 2021-04-02 MED ORDER — DICLOFENAC SODIUM 1 % EX GEL
2.0000 g | Freq: Four times a day (QID) | CUTANEOUS | Status: DC
  Administered 2021-04-02 – 2021-04-05 (×8): 2 g via TOPICAL
  Filled 2021-04-02: qty 100

## 2021-04-02 MED ORDER — ATORVASTATIN CALCIUM 40 MG PO TABS: 80.0000 mg | ORAL_TABLET | Freq: Every day | ORAL | Status: DC

## 2021-04-02 MED ORDER — IOHEXOL 9 MG/ML PO SOLN
ORAL | Status: AC
  Filled 2021-04-02: qty 1000

## 2021-04-02 MED ORDER — ATORVASTATIN CALCIUM 40 MG PO TABS
80.0000 mg | ORAL_TABLET | Freq: Every evening | ORAL | Status: DC
  Administered 2021-04-02 – 2021-04-04 (×3): 80 mg via ORAL
  Filled 2021-04-02 (×3): qty 2

## 2021-04-02 MED ORDER — ASPIRIN EC 81 MG PO TBEC
81.0000 mg | DELAYED_RELEASE_TABLET | Freq: Every day | ORAL | Status: DC
  Administered 2021-04-02: 81 mg via ORAL
  Filled 2021-04-02: qty 1

## 2021-04-02 MED ORDER — IOHEXOL 300 MG/ML  SOLN
75.0000 mL | Freq: Once | INTRAMUSCULAR | Status: AC | PRN
  Administered 2021-04-02: 75 mL via INTRAVENOUS

## 2021-04-02 NOTE — Progress Notes (Signed)
Physical Therapy Treatment Patient Details Name: Stacey [name] MRN: [id number] DOB: 1946/06/02 Today's Date: 04/02/2021    History of Present Illness [name] is a 75 y.o. female with c/o abdominal pain, some nausea. CT noted cholelithiasis, possible surgical intervention to follow. PMH: metastatic breast cancer with a pain pump in place, HTN, CAD, CHF, COPD, MI, afib    PT Comments    Patient very apprehensive to get requiring encouragement and states her son will be able to take care of her when she returns home.  Patient able to roll to side, but unable to come to complete sitting due to c/o severe pain in abdomin, became labile and requested to lie down.  RN notified to give patient pain medication.  Patient will benefit from continued physical therapy in hospital and recommended venue below to increase strength, balance, endurance for safe ADLs and gait.    Follow Up Recommendations  Home health PT;Supervision/Assistance - 24 hour     Equipment Recommendations  None recommended by PT    Recommendations for Other Services       Precautions / Restrictions Precautions Precautions: Fall Restrictions Weight Bearing Restrictions: No    Mobility  Bed Mobility Overal bed mobility: Needs Assistance Bed Mobility: Rolling;Sidelying to Sit;Sit to Sidelying Rolling: Mod assist Sidelying to sit: Mod assist;Max assist     Sit to sidelying: Mod assist General bed mobility comments: Patient unable to come to complete sitting due to c/o severe abdominal pain    Transfers                    Ambulation/Gait                 Stairs             Wheelchair Mobility    Modified Rankin (Stroke Patients Only)       Balance Overall balance assessment: Needs assistance Sitting-balance support: Feet supported;No upper extremity supported Sitting balance-Leahy Scale: Poor Sitting balance - Comments: leaning to left side due to c/o severe abdominal  pain Postural control: Left lateral lean                                  Cognition Arousal/Alertness: Awake/alert Behavior During Therapy: WFL for tasks assessed/performed Overall Cognitive Status: Within Functional Limits for tasks assessed                                        Exercises      General Comments        Pertinent Vitals/Pain Pain Assessment: Faces Faces Pain Scale: Hurts whole lot Pain Location: abdomen Pain Descriptors / Indicators: Grimacing;Guarding;Sore Pain Intervention(s): Limited activity within patient's tolerance;Monitored during session;Repositioned    Home Living                      Prior Function            PT Goals (current goals can now be found in the care plan section) Acute Rehab PT Goals Patient Stated Goal: home with family to assist PT Goal Formulation: With patient Time For Goal Achievement: 04/09/21 Potential to Achieve Goals: Good Progress towards PT goals: Progressing toward goals    Frequency    Min 3X/week      PT Plan Current plan remains appropriate  Co-evaluation              AM-PAC PT "6 Clicks" Mobility   Outcome Measure  Help needed turning from your back to your side while in a flat bed without using bedrails?: A Lot Help needed moving from lying on your back to sitting on the side of a flat bed without using bedrails?: A Lot Help needed moving to and from a bed to a chair (including a wheelchair)?: A Lot Help needed standing up from a chair using your arms (e.g., wheelchair or bedside chair)?: A Lot Help needed to walk in hospital room?: A Lot Help needed climbing 3-5 steps with a railing? : Total 6 Click Score: 11    End of Session   Activity Tolerance: Patient limited by pain;Patient limited by fatigue Patient left: in bed;with call bell/phone within reach Nurse Communication: Mobility status PT Visit Diagnosis: Unsteadiness on feet (R26.81);Other  abnormalities of gait and mobility (R26.89);Muscle weakness (generalized) (M62.81)     Time: 5643-3295 PT Time Calculation (min) (ACUTE ONLY): 15 min  Charges:  $Therapeutic Activity: 8-22 mins                     2:34 PM, 04/02/21 Lonell Grandchild, MPT Physical Therapist with [facility] 336 [phone] office [phone] mobile phone

## 2021-04-02 NOTE — Progress Notes (Signed)
Patient refuses Bipap.

## 2021-04-02 NOTE — Progress Notes (Signed)
[facility]  Patient s/p lap chole and history of chronic pain. Her back has the most pain her abdomen is hurting post op if she sits up. She is refusing rehab, and crying.  Lab work looks good and eating ok.   Firmness at the umbilicus about 5cm in size could be hematoma versus hernia from the port site. Would be unusual to be hernia but will get CT to assess.   If just hematoma then will just monitor. Given the location and her H&H drop it is probably a hematoma.   Hold plavix until we ensure no additional procedure needed. Discussed with Dr. Wynetta Emery.   Curlene Labrum, MD [facility] [address] [place], [place] [address] [phone] (office)

## 2021-04-02 NOTE — Progress Notes (Addendum)
PROGRESS NOTE  Stacey [name] KZS:[id number] DOB: 03-28-1946 DOA: 03/24/2021 PCP: Sela Hilding   Brief History:  75 year old female with a history of coronary artery disease status post DES to the RCA December 7322, systolic & diastolic CHF, COPD, hypertension, metastatic breast cancer, hyperlipidemia, PSVT, chronic pain syndrome status post intrathecal pump placement presenting with abdominal pain that began on 03/24/2021 that was severe rating 10/10 diffuse in nature.  The patient has subjective fevers and chills.  She had nausea without emesis.  She denies any diarrhea.  She denies any headache, cough, hemoptysis, chest pain, worsening shortness of breath, hematochezia, melena, dysuria, hematuria. In the emergency department, the patient was afebrile hemodynamically stable with oxygen saturation 100% on 3 L.  CT of the abdomen and pelvis showed gallbladder wall thickening and edema with infiltration into the pericholecystic fat.  There was extrahepatic ductal dilatation with suggestion of stones in the distal CBD and moderate intrahepatic biliary ductal dilatation.  There was infiltration and thickening around the proximal duodenum probably referred from the gallbladder.  The patient was started on Zosyn.  GI and general surgery were consulted to assist.  Assessment/Plan:  Acute respiratory failure with hypercarbia - resolved - likely side effect of anesthesia -PCO2 came down with bipap therapy - CXR no acute findings - careful with opioids - continue supportive measures   Acute cholecystitis/ POD#4 s/p Lap Chole  -General surgery consult appreciated-->lap chole 4/29 with Dr. Constance Haw -Completed IV Zosyn -tolerating soft diet -no BM yet, continue colace BID  Transaminasemia with hyperbilirubinemia -GI consult appreciated -ERCP planned 03/28/21 (allowing plavix washout) -due to cholecystitis/choledocholithiasis -ERCP was successful with 3 pigmented stones  removed  Chronic systolic and diastolic CHF -Appears clinically euvolemic -04/18/2020 echo EF 35-40%, G1DD, mild to moderate AAS, mild MR -Continue metoprolol succinate -03/25/21 Echo EF 50-55%, G1DD, no WMA, trivial MR  Chronic respiratory failure with nocturnal hypoxia -Patient is normally on 1 L nasal cannula at nighttime, now on room air -Monitor clinically -Incentive spirometry  Coronary artery disease -No chest pain presently -plavix and aspirin temporarily on hold, resume when Hg stabilized postop -resumed home metoprolol succinate  Generalized weakness and deconditioning - long discussion with son [name], wants to take home with [facility] but not interested in SNF - wants patient to stay in hospital for 5 more days, discussed with him that it likely would not be possible - asked them to consider short term rehab and then go home if he feels like he can't handle her the way she is now  Chronic pain syndrome -Patient has intrathecal morphine pump infusing 5.1 mg per 24-hour. -resume home dose oxycodone and Lyrica  Metastatic breast cancer to the bone -02/19/2021 bone scan--negative for bone metastasis -Outpatient follow-up with Dr. Delton Coombes  COPD -Stable  Chronic L1/L2 compression fracture -Continue home opioid regimen  Essential hypertension -Continue metoprolol succinate   Hyperlipidemia -Holding Lipitor secondary to elevated LFTs initially  Hypokalemia -repleted  Hypomagnesemia - repleted with IV treatment  Macrocytic anemia -B12 and folate within normal limits -baseline Hgb 11-12  Morbid Obesity -BMI 39.09 -lifestyle modification  Status is: Inpatient  Appropriate for inpatient due to severity of illness  Dispo: The patient is from: Home  Anticipated d/c is to: Home with [facility] services  Patient currently is not medically stable to d/c. Awaiting surgical clearance to restart plavix and aspirin              Difficult  to place patient  No  Family Communication:  spoke with son [name]   Consultants:  GI, general surgery  Code Status:  FULL   DVT Prophylaxis:  SCDs   Procedures: As Listed in Progress Note Above  Antibiotics: Zosyn 4/25>>5/1  Subjective: Patient reports feeling significantly weak and deconditioned.  Chronic pain issues starting to return.  No BM reported.   Objective: Vitals:   04/01/21 0907 04/01/21 1402 04/01/21 2109 04/02/21 0527  BP: (!) 137/58 (!) 153/63 (!) 120/54 118/61  Pulse: 94 90 65 80  Resp:  18 17 18   Temp:  97.8 F (36.6 C) 98.2 F (36.8 C) 97.8 F (36.6 C)  TempSrc:  Oral Oral   SpO2: 96% 98% 99% 96%  Weight:      Height:        Intake/Output Summary (Last 24 hours) at 04/02/2021 1343 Last data filed at 04/02/2021 1250 Gross per 24 hour  Intake 960.93 ml  Output 750 ml  Net 210.93 ml   Weight change:  Exam:   General:  Pt appears chronically ill, is easily arousable and follows commands appropriately, not in acute distress  HEENT: No icterus, No thrush, No neck mass, [place]/AT  Cardiovascular: irregularly irregular S1/S2, no rubs, no gallops  Respiratory: bilateral BS clear but shallow breathing.   Abdomen: nontender, no guarding  Extremities: No edema, No lymphangitis, No petechiae, No rashes, no synovitis  Data Reviewed: I have personally reviewed following labs and imaging studies Basic Metabolic Panel: Recent Labs  Lab 03/27/21 0503 03/28/21 0430 03/29/21 0432 03/30/21 0415 03/31/21 0350 04/01/21 0509 04/02/21 0410  NA 141 143 141 143 138 138 138  K 3.5 4.0 4.1 3.5 4.1 4.0 3.5  CL 101 103 103 103 100 101 100  CO2 30 33* 27 31 30 31  32  GLUCOSE 95 97 151* 126* 102* 112* 115*  BUN 13 9 15 15 15 12 11   CREATININE 0.76 0.67 0.85 0.87 0.64 0.59 0.59  CALCIUM 9.3 9.7 9.8 9.3 8.7* 8.7* 8.6*  MG 1.7 1.7 1.7 1.6*  --  1.7  --    Liver Function Tests: Recent Labs  Lab 03/28/21 0430 03/29/21 0432 03/30/21 0415 03/31/21 0350  04/01/21 0509  AST 56* 48* 117* 72* 40  ALT 82* 65* 96* 71* 47*  ALKPHOS 84 83 76 68 64  BILITOT 1.3* 1.0 1.3* 2.0* 0.9  PROT 5.9* 6.1* 5.9* 5.6* 5.2*  ALBUMIN 3.1* 3.3* 3.1* 2.8* 2.4*   No results for input(s): LIPASE, AMYLASE in the last 168 hours. No results for input(s): AMMONIA in the last 168 hours. Coagulation Profile: No results for input(s): INR, PROTIME in the last 168 hours. CBC: Recent Labs  Lab 03/28/21 0430 03/29/21 0432 03/30/21 0415 03/31/21 0350 04/01/21 0509 04/02/21 0410  WBC 4.9 4.3 8.5 7.4 6.2 5.0  NEUTROABS 2.4 2.7 6.8  --   --  3.1  HGB 10.8* 11.0* 11.1* 9.7* 8.8* 8.3*  HCT 33.0* 33.3* 35.2* 30.0* 27.0* 25.8*  MCV 98.8 98.8 102.6* 101.4* 101.1* 101.2*  PLT 189 195 166 155 146* 162   Cardiac Enzymes: No results for input(s): CKTOTAL, CKMB, CKMBINDEX, TROPONINI in the last 168 hours. BNP: Invalid input(s): POCBNP CBG: Recent Labs  Lab 03/28/21 1420  GLUCAP 160*   HbA1C: No results for input(s): HGBA1C in the last 72 hours. Urine analysis:    Component Value Date/Time   COLORURINE AMBER (A) 03/24/2021 2201   APPEARANCEUR CLOUDY (A) 03/24/2021 2201   LABSPEC 1.038 (H) 03/24/2021 2201   PHURINE 5.0 03/24/2021  2201   GLUCOSEU 50 (A) 03/24/2021 2201   HGBUR NEGATIVE 03/24/2021 2201   BILIRUBINUR NEGATIVE 03/24/2021 2201   KETONESUR 5 (A) 03/24/2021 2201   PROTEINUR 100 (A) 03/24/2021 2201   NITRITE NEGATIVE 03/24/2021 2201   LEUKOCYTESUR NEGATIVE 03/24/2021 2201   Sepsis Labs:  Recent Results (from the past 240 hour(s))  Resp Panel by RT-PCR (Flu A&B, Covid) Nasopharyngeal Swab     Status: None   Collection Time: 03/25/21  1:48 AM   Specimen: Nasopharyngeal Swab; Nasopharyngeal(NP) swabs in vial transport medium  Result Value Ref Range Status   SARS Coronavirus 2 by RT PCR NEGATIVE NEGATIVE Final    Comment: (NOTE) SARS-CoV-2 target nucleic acids are NOT DETECTED.  The SARS-CoV-2 RNA is generally detectable in upper  respiratory specimens during the acute phase of infection. The lowest concentration of SARS-CoV-2 viral copies this assay can detect is 138 copies/mL. A negative result does not preclude SARS-Cov-2 infection and should not be used as the sole basis for treatment or other patient management decisions. A negative result may occur with  improper specimen collection/handling, submission of specimen other than nasopharyngeal swab, presence of viral mutation(s) within the areas targeted by this assay, and inadequate number of viral copies(<138 copies/mL). A negative result must be combined with clinical observations, patient history, and epidemiological information. The expected result is Negative.  Fact Sheet for Patients:  BloggerCourse.com  Fact Sheet for Healthcare Providers:  SeriousBroker.it  This test is no t yet approved or cleared by the [place] FDA and  has been authorized for detection and/or diagnosis of SARS-CoV-2 by FDA under an Emergency Use Authorization (EUA). This EUA will remain  in effect (meaning this test can be used) for the duration of the COVID-19 declaration under Section 564(b)(1) of the Act, 21 U.S.C.section 360bbb-3(b)(1), unless the authorization is terminated  or revoked sooner.       Influenza A by PCR NEGATIVE NEGATIVE Final   Influenza B by PCR NEGATIVE NEGATIVE Final    Comment: (NOTE) The Xpert Xpress SARS-CoV-2/FLU/RSV plus assay is intended as an aid in the diagnosis of influenza from Nasopharyngeal swab specimens and should not be used as a sole basis for treatment. Nasal washings and aspirates are unacceptable for Xpert Xpress SARS-CoV-2/FLU/RSV testing.  Fact Sheet for Patients: BloggerCourse.com  Fact Sheet for Healthcare Providers: SeriousBroker.it  This test is not yet approved or cleared by the [place] FDA and has been  authorized for detection and/or diagnosis of SARS-CoV-2 by FDA under an Emergency Use Authorization (EUA). This EUA will remain in effect (meaning this test can be used) for the duration of the COVID-19 declaration under Section 564(b)(1) of the Act, 21 U.S.C. section 360bbb-3(b)(1), unless the authorization is terminated or revoked.  Performed at [facility], [address], [place], [place] [address]   Surgical pcr screen     Status: Abnormal   Collection Time: 03/29/21  8:41 AM   Specimen: Nasal Mucosa; Nasal Swab  Result Value Ref Range Status   MRSA, PCR POSITIVE (A) NEGATIVE Final    Comment: RESULT CALLED TO, READ BACK BY AND VERIFIED WITH: TORI RN @ 1118 ON [id number] BY HENDERSON L.    Staphylococcus aureus POSITIVE (A) NEGATIVE Final    Comment: RESULT CALLED TO, READ BACK BY AND VERIFIED WITH: TORI RN @ 1118 ON [id number] BY HENDERSON L. (NOTE) The Xpert SA Assay (FDA approved for NASAL specimens in patients 69 years of age and older), is one component of a comprehensive surveillance  program. It is not intended to diagnose infection nor to guide or monitor treatment. Performed at [facility], [address] [place], [place] [address]      Scheduled Meds: . vitamin C  500 mg Oral Daily  . Chlorhexidine Gluconate Cloth  6 each Topical Daily  . docusate sodium  100 mg Oral BID  . DULoxetine  90 mg Oral Daily  . metoprolol succinate  150 mg Oral Daily  . multivitamin with minerals  1 tablet Oral Daily  . mupirocin ointment  1 application Nasal BID  . polyvinyl alcohol  1 drop Both Eyes TID  . pregabalin  50 mg Oral TID   Continuous Infusions:   Procedures/Studies: CT ABDOMEN PELVIS W CONTRAST  Result Date: 03/25/2021 CLINICAL DATA:  Acute abdominal pain. History of MI, hypertension, congestive heart failure, breast cancer. EXAM: CT ABDOMEN AND PELVIS WITH CONTRAST TECHNIQUE: Multidetector CT imaging of the abdomen and pelvis was performed using the standard protocol  following bolus administration of intravenous contrast. CONTRAST:  OMNIPAQUE IOHEXOL 300 MG/ML  SOLN COMPARISON:  02/19/2021 FINDINGS: Lower chest: Lung bases are clear. Hepatobiliary: Gallbladder wall thickening and edema with infiltration into the pericholecystic fat. Small stones layering in the gallbladder. Extrahepatic bile duct dilatation with suggestion of stones in the distal common bile duct. Moderate intrahepatic bile duct dilatation. No focal liver lesions. Small amount of perihepatic ascites. Pancreas: Unremarkable. No pancreatic ductal dilatation or surrounding inflammatory changes. Spleen: Normal in size without focal abnormality. Adrenals/Urinary Tract: No adrenal gland nodules. Complex hypoenhancing lesion demonstrated in the left kidney measuring 1.9 cm diameter. This may represent a renal cell carcinoma. Similar appearance to previous study. No hydronephrosis or hydroureter. Bladder wall is thickened, probably due to under distention although cystitis could also have this appearance. Stomach/Bowel: Stomach, small bowel, and colon are not abnormally distended. Scattered colonic diverticula without evidence of diverticulitis. Infiltration and wall thickening around the proximal duodenum probably is referred inflammation from the gallbladder. Duodenitis could also be present. Appendix is not identified. Vascular/Lymphatic: Aortic atherosclerosis. No enlarged abdominal or pelvic lymph nodes. Reproductive: Uterus is surgically absent. No abnormal adnexal masses. Other: No free air in the abdomen. Moderate-sized periumbilical hernia containing fat. Musculoskeletal: Degenerative changes in the spine. Lumbar scoliosis convex towards the left. Sclerotic bone lesions consistent with metastatic disease and vertebral compression of L2. IMPRESSION: 1. Cholelithiasis with changes of acute cholecystitis. Bile duct dilatation with probable stones in the distal common bile duct. 2. Infiltration and wall  thickening around the proximal duodenum probably is referred inflammation from the gallbladder. Duodenitis could also have this appearance. 3. Complex hypoenhancing lesion in the left kidney may represent a renal cell carcinoma. No change since prior study. 4. Sclerotic bone lesions consistent with metastatic disease. Compression of L2. 5. Moderate-sized periumbilical hernia containing fat. 6. Aortic atherosclerosis. Aortic Atherosclerosis (ICD10-I70.0). Electronically Signed   By: Burman Nieves M.D.   On: 03/25/2021 00:50   DG CHEST PORT 1 VIEW  Result Date: 03/30/2021 CLINICAL DATA:  Acute on chronic respiratory failure.  Hypercapnia. EXAM: PORTABLE CHEST 1 VIEW COMPARISON:  November 15, 2019 FINDINGS: The heart size and mediastinal contours are within normal limits. Both lungs are clear. The visualized skeletal structures are unremarkable. IMPRESSION: No active disease. Electronically Signed   By: Gerome Sam III M.D   On: 03/30/2021 08:52   DG ERCP BILIARY & PANCREATIC DUCTS  Result Date: 03/28/2021 CLINICAL DATA:  Choledocholithiasis EXAM: ERCP TECHNIQUE: Multiple spot images obtained with the fluoroscopic device and submitted  for interpretation post-procedure. COMPARISON:  CT abdomen pelvis-03/25/2021 FLUOROSCOPY TIME:  2 minutes, 2 seconds (39.4 mGy) FINDINGS: Multiple spot fluoroscopic images the right upper abdominal quadrant during ERCP are provided for review. Initial image demonstrates an ERCP probe overlying the right upper abdominal quadrant. Subsequent images demonstrate selective cannulation opacification of the common duct which appears moderately dilated. There are several transient nonocclusive filling defects within the CBD which could represent air bubbles versus nonocclusive choledocholithiasis. Subsequent images demonstrate insufflation of a balloon within the central aspect of the CBD with subsequent biliary sweeping and presumed stone extraction and sphincterotomy There is  minimal opacification of the central aspect of the intrahepatic biliary tree which appears mildly dilated. There is faint opacification of the gallbladder lumen. There is no definitive opacification of the pancreatic duct. IMPRESSION: ERCP with biliary sweeping and presumed stone extraction and sphincterotomy as above. These images were submitted for radiologic interpretation only. Please see the procedural report for the amount of contrast and the fluoroscopy time utilized. Electronically Signed   By: Sandi Mariscal M.D.   On: 03/28/2021 14:23   ECHOCARDIOGRAM COMPLETE  Result Date: 03/25/2021    ECHOCARDIOGRAM REPORT   Patient Name:   Stacey [name] Date of Exam: 03/25/2021 Medical Rec #:  [id number]         Height:       60.0 in Accession #:    [id number]        Weight:       200.2 lb Date of Birth:  [birth year]          BSA:          1.867 m Patient Age:    41 years          BP:           141/72 mmHg Patient Gender: F                 HR:           71 bpm. Exam Location:  [facility] Procedure: 2D Echo, Cardiac Doppler and Color Doppler Indications:    CHF  History:        Patient has prior history of Echocardiogram examinations, most                 recent 04/18/2020. CHF, CAD, COPD, Aortic Valve Disease and                 Aortic stenosis; Risk Factors:Hypertension, Dyslipidemia and                 morbid obestiy. Metastatic breast cancer.  Sonographer:    Dustin Flock RDCS Referring Phys: [phone] DAVID TAT IMPRESSIONS  1. Left ventricular ejection fraction, by estimation, is 50 to 55%. The left ventricle has low normal function. The left ventricle has no regional wall motion abnormalities. Left ventricular diastolic parameters are consistent with Grade I diastolic dysfunction (impaired relaxation).  2. Right ventricular systolic function is normal. The right ventricular size is normal. There is mildly elevated pulmonary artery systolic pressure. The estimated right ventricular systolic pressure is 63.3 mmHg.   3. Left atrial size was upper normal.  4. The mitral valve is grossly normal. Trivial mitral valve regurgitation.  5. The aortic valve is tricuspid with some fusion of left and noncoronary cusps. There is moderate calcification of the aortic valve. Aortic valve regurgitation is not visualized. Moderate aortic valve stenosis. Aortic valve mean gradient measures 10.5 mmHg. Dimentionless index 0.31.  6. The inferior  vena cava is normal in size with greater than 50% respiratory variability, suggesting right atrial pressure of 3 mmHg. FINDINGS  Left Ventricle: Left ventricular ejection fraction, by estimation, is 50 to 55%. The left ventricle has low normal function. The left ventricle has no regional wall motion abnormalities. The left ventricular internal cavity size was normal in size. There is borderline left ventricular hypertrophy. Abnormal (paradoxical) septal motion, consistent with left bundle branch block. Left ventricular diastolic parameters are consistent with Grade I diastolic dysfunction (impaired relaxation). Right Ventricle: The right ventricular size is normal. No increase in right ventricular wall thickness. Right ventricular systolic function is normal. There is mildly elevated pulmonary artery systolic pressure. The tricuspid regurgitant velocity is 3.24  m/s, and with an assumed right atrial pressure of 3 mmHg, the estimated right ventricular systolic pressure is 93.8 mmHg. Left Atrium: Left atrial size was upper normal. Right Atrium: Right atrial size was normal in size. Pericardium: There is no evidence of pericardial effusion. Mitral Valve: The mitral valve is grossly normal. Mild mitral annular calcification. Trivial mitral valve regurgitation. Tricuspid Valve: The tricuspid valve is grossly normal. Tricuspid valve regurgitation is mild. Aortic Valve: The aortic valve is tricuspid. There is moderate calcification of the aortic valve. There is moderate aortic valve annular calcification. Aortic  valve regurgitation is not visualized. Moderate aortic stenosis is present. Aortic valve mean gradient measures 10.5 mmHg. Aortic valve peak gradient measures 18.1 mmHg. Aortic valve area, by VTI measures 1.09 cm. Pulmonic Valve: The pulmonic valve was grossly normal. Pulmonic valve regurgitation is trivial. Aorta: The aortic root is normal in size and structure. Venous: The inferior vena cava is normal in size with greater than 50% respiratory variability, suggesting right atrial pressure of 3 mmHg. IAS/Shunts: No atrial level shunt detected by color flow Doppler.  LEFT VENTRICLE PLAX 2D LVIDd:         4.35 cm  Diastology LVIDs:         3.18 cm  LV e' medial:    5.44 cm/s LV PW:         1.05 cm  LV E/e' medial:  11.7 LV IVS:        1.08 cm  LV e' lateral:   7.40 cm/s LVOT diam:     2.10 cm  LV E/e' lateral: 8.6 LV SV:         52 LV SV Index:   28 LVOT Area:     3.46 cm  RIGHT VENTRICLE RV Basal diam:  3.00 cm RV S prime:     7.07 cm/s TAPSE (M-mode): 2.1 cm LEFT ATRIUM             Index       RIGHT ATRIUM           Index LA diam:        3.80 cm 2.04 cm/m  RA Area:     11.70 cm LA Vol (A2C):   37.5 ml 20.08 ml/m RA Volume:   32.00 ml  17.14 ml/m LA Vol (A4C):   60.0 ml 32.13 ml/m LA Biplane Vol: 50.8 ml 27.20 ml/m  AORTIC VALVE AV Area (Vmax):    1.10 cm AV Area (Vmean):   1.16 cm AV Area (VTI):     1.09 cm AV Vmax:           213.00 cm/s AV Vmean:          149.000 cm/s AV VTI:  0.477 m AV Peak Grad:      18.1 mmHg AV Mean Grad:      10.5 mmHg LVOT Vmax:         67.90 cm/s LVOT Vmean:        50.000 cm/s LVOT VTI:          0.150 m LVOT/AV VTI ratio: 0.31  AORTA Ao Root diam: 2.70 cm MITRAL VALVE               TRICUSPID VALVE MV Area (PHT): 2.68 cm    TR Peak grad:   42.0 mmHg MV Decel Time: 283 msec    TR Vmax:        324.00 cm/s MV E velocity: 63.80 cm/s MV A velocity: 89.50 cm/s  SHUNTS MV E/A ratio:  0.71        Systemic VTI:  0.15 m                            Systemic Diam: 2.10 cm Rozann Lesches MD Electronically signed by Rozann Lesches MD Signature Date/Time: 03/25/2021/2:24:21 PM    Final    Irwin Brakeman, MD  [facility]  If 7PM-7AM, please contact night-coverage [email or web address] Password [email or web address] 04/02/2021, 1:43 PM   LOS: 8 days

## 2021-04-03 ENCOUNTER — Inpatient Hospital Stay (HOSPITAL_COMMUNITY): Payer: Medicare Other | Admitting: Anesthesiology

## 2021-04-03 ENCOUNTER — Encounter (HOSPITAL_COMMUNITY)
Admission: EM | Disposition: A | Discharge status: Home Health | Payer: Self-pay | Source: Home / Self Care | Attending: Family Medicine

## 2021-04-03 ENCOUNTER — Encounter (HOSPITAL_COMMUNITY): Payer: Self-pay | Admitting: Internal Medicine

## 2021-04-03 DIAGNOSIS — K8001 Calculus of gallbladder with acute cholecystitis with obstruction: Secondary | ICD-10-CM | POA: Diagnosis not present

## 2021-04-03 DIAGNOSIS — C50919 Malignant neoplasm of unspecified site of unspecified female breast: Secondary | ICD-10-CM | POA: Diagnosis not present

## 2021-04-03 DIAGNOSIS — I5042 Chronic combined systolic (congestive) and diastolic (congestive) heart failure: Secondary | ICD-10-CM | POA: Diagnosis not present

## 2021-04-03 DIAGNOSIS — K420 Umbilical hernia with obstruction, without gangrene: Secondary | ICD-10-CM | POA: Diagnosis not present

## 2021-04-03 DIAGNOSIS — I2510 Atherosclerotic heart disease of native coronary artery without angina pectoris: Secondary | ICD-10-CM | POA: Diagnosis not present

## 2021-04-03 HISTORY — PX: UMBILICAL HERNIA REPAIR: SHX196

## 2021-04-03 LAB — CBC
HCT: 25.4 % — ABNORMAL LOW (ref 36.0–46.0)
Hemoglobin: 8.2 g/dL — ABNORMAL LOW (ref 12.0–15.0)
MCH: 32.9 pg (ref 26.0–34.0)
MCHC: 32.3 g/dL (ref 30.0–36.0)
MCV: 102 fL — ABNORMAL HIGH (ref 80.0–100.0)
Platelets: 189 10*3/uL (ref 150–400)
RBC: 2.49 MIL/uL — ABNORMAL LOW (ref 3.87–5.11)
RDW: 14 % (ref 11.5–15.5)
WBC: 4.6 10*3/uL (ref 4.0–10.5)
nRBC: 0 % (ref 0.0–0.2)

## 2021-04-03 LAB — BASIC METABOLIC PANEL
Anion gap: 6 (ref 5–15)
BUN: 11 mg/dL (ref 8–23)
CO2: 33 mmol/L — ABNORMAL HIGH (ref 22–32)
Calcium: 8.8 mg/dL — ABNORMAL LOW (ref 8.9–10.3)
Chloride: 102 mmol/L (ref 98–111)
Creatinine, Ser: 0.66 mg/dL (ref 0.44–1.00)
GFR, Estimated: >60 mL/min (ref >60)
Glucose, Bld: 115 mg/dL — ABNORMAL HIGH (ref 70–99)
Potassium: 3.8 mmol/L (ref 3.5–5.1)
Sodium: 141 mmol/L (ref 135–145)

## 2021-04-03 LAB — LACTIC ACID, PLASMA: Lactic Acid, Venous: 1.1 mmol/L (ref 0.5–1.9)

## 2021-04-03 SURGERY — REPAIR, HERNIA, UMBILICAL, ADULT
Anesthesia: General | Site: Abdomen

## 2021-04-03 MED ORDER — BUPIVACAINE LIPOSOME 1.3 % IJ SUSP
INTRAMUSCULAR | Status: AC
  Filled 2021-04-03: qty 20

## 2021-04-03 MED ORDER — LACTATED RINGERS IV SOLN: INTRAVENOUS | Status: DC

## 2021-04-03 MED ORDER — SODIUM CHLORIDE 0.9 % IR SOLN
Status: DC | PRN
  Administered 2021-04-03: 1000 mL

## 2021-04-03 MED ORDER — FENTANYL CITRATE (PF) 100 MCG/2ML IJ SOLN: 25.0000 ug | INTRAMUSCULAR | Status: DC | PRN

## 2021-04-03 MED ORDER — DEXAMETHASONE SODIUM PHOSPHATE 10 MG/ML IJ SOLN
INTRAMUSCULAR | Status: DC | PRN
  Administered 2021-04-03: 4 mg via INTRAVENOUS

## 2021-04-03 MED ORDER — FENTANYL CITRATE (PF) 100 MCG/2ML IJ SOLN
INTRAMUSCULAR | Status: AC
  Filled 2021-04-03: qty 2

## 2021-04-03 MED ORDER — PROPOFOL 10 MG/ML IV BOLUS
INTRAVENOUS | Status: DC | PRN
  Administered 2021-04-03: 100 mg via INTRAVENOUS

## 2021-04-03 MED ORDER — BUPIVACAINE LIPOSOME 1.3 % IJ SUSP
INTRAMUSCULAR | Status: DC | PRN
  Administered 2021-04-03: 20 mL

## 2021-04-03 MED ORDER — ROCURONIUM BROMIDE 10 MG/ML (PF) SYRINGE
PREFILLED_SYRINGE | INTRAVENOUS | Status: AC
  Filled 2021-04-03: qty 10

## 2021-04-03 MED ORDER — CHLORHEXIDINE GLUCONATE 0.12 % MT SOLN: 15.0000 mL | Freq: Once | OROMUCOSAL | Status: DC

## 2021-04-03 MED ORDER — PHENYLEPHRINE 40 MCG/ML (10ML) SYRINGE FOR IV PUSH (FOR BLOOD PRESSURE SUPPORT)
PREFILLED_SYRINGE | INTRAVENOUS | Status: AC
  Filled 2021-04-03: qty 10

## 2021-04-03 MED ORDER — FENTANYL CITRATE (PF) 100 MCG/2ML IJ SOLN
INTRAMUSCULAR | Status: DC | PRN
  Administered 2021-04-03 (×2): 50 ug via INTRAVENOUS

## 2021-04-03 MED ORDER — MIDAZOLAM HCL 5 MG/5ML IJ SOLN
INTRAMUSCULAR | Status: DC | PRN
  Administered 2021-04-03: 1 mg via INTRAVENOUS

## 2021-04-03 MED ORDER — ORAL CARE MOUTH RINSE: 15.0000 mL | Freq: Once | OROMUCOSAL | Status: DC

## 2021-04-03 MED ORDER — LIDOCAINE HCL (PF) 2 % IJ SOLN
INTRAMUSCULAR | Status: AC
  Filled 2021-04-03: qty 5

## 2021-04-03 MED ORDER — CEFAZOLIN SODIUM-DEXTROSE 2-4 GM/100ML-% IV SOLN
2.0000 g | INTRAVENOUS | Status: AC
  Administered 2021-04-03: 2 g via INTRAVENOUS
  Filled 2021-04-03: qty 100

## 2021-04-03 MED ORDER — CHLORHEXIDINE GLUCONATE 0.12 % MT SOLN
OROMUCOSAL | Status: AC
  Filled 2021-04-03: qty 15

## 2021-04-03 MED ORDER — ASPIRIN EC 81 MG PO TBEC
81.0000 mg | DELAYED_RELEASE_TABLET | Freq: Every day | ORAL | Status: DC
  Administered 2021-04-05: 81 mg via ORAL
  Filled 2021-04-03: qty 1

## 2021-04-03 MED ORDER — ONDANSETRON HCL 4 MG/2ML IJ SOLN
INTRAMUSCULAR | Status: DC | PRN
  Administered 2021-04-03: 4 mg via INTRAVENOUS

## 2021-04-03 MED ORDER — ONDANSETRON HCL 4 MG/2ML IJ SOLN: 4.0000 mg | Freq: Once | INTRAMUSCULAR | Status: DC | PRN

## 2021-04-03 MED ORDER — PHENYLEPHRINE HCL (PRESSORS) 10 MG/ML IV SOLN
INTRAVENOUS | Status: DC | PRN
  Administered 2021-04-03 (×2): 100 ug via INTRAVENOUS

## 2021-04-03 MED ORDER — LIDOCAINE HCL (CARDIAC) PF 100 MG/5ML IV SOSY
PREFILLED_SYRINGE | INTRAVENOUS | Status: DC | PRN
  Administered 2021-04-03: 50 mg via INTRAVENOUS

## 2021-04-03 MED ORDER — CHLORHEXIDINE GLUCONATE CLOTH 2 % EX PADS: 6.0000 | MEDICATED_PAD | Freq: Once | CUTANEOUS | Status: DC

## 2021-04-03 MED ORDER — MIDAZOLAM HCL 2 MG/2ML IJ SOLN
INTRAMUSCULAR | Status: AC
  Filled 2021-04-03: qty 2

## 2021-04-03 MED ORDER — LACTATED RINGERS IV SOLN: INTRAVENOUS | Status: DC | PRN

## 2021-04-03 SURGICAL SUPPLY — 32 items
APPLICATOR COTTON TIP 6 STRL (MISCELLANEOUS) ×1 IMPLANT
APPLICATOR COTTON TIP 6IN STRL (MISCELLANEOUS) ×2
BLADE SURG 15 STRL LF DISP TIS (BLADE) ×1 IMPLANT
BLADE SURG 15 STRL SS (BLADE) ×1
CHLORAPREP W/TINT 26 (MISCELLANEOUS) ×2 IMPLANT
CLOTH BEACON ORANGE TIMEOUT ST (SAFETY) ×2 IMPLANT
COVER LIGHT HANDLE STERIS (MISCELLANEOUS) ×4 IMPLANT
COVER WAND RF STERILE (DRAPES) ×2 IMPLANT
DERMABOND ADVANCED (GAUZE/BANDAGES/DRESSINGS) ×1
DERMABOND ADVANCED .7 DNX12 (GAUZE/BANDAGES/DRESSINGS) ×1 IMPLANT
ELECT REM PT RETURN 9FT ADLT (ELECTROSURGICAL) ×2
ELECTRODE REM PT RTRN 9FT ADLT (ELECTROSURGICAL) ×1 IMPLANT
GLOVE SURG ENC MOIS LTX SZ6.5 (GLOVE) ×2 IMPLANT
GLOVE SURG UNDER POLY LF SZ6.5 (GLOVE) ×2 IMPLANT
GLOVE SURG UNDER POLY LF SZ7 (GLOVE) ×2 IMPLANT
GOWN STRL REUS W/TWL LRG LVL3 (GOWN DISPOSABLE) ×4 IMPLANT
INST SET MINOR GENERAL (KITS) ×2 IMPLANT
KIT TURNOVER KIT A (KITS) ×2 IMPLANT
LIGASURE IMPACT 36 18CM CVD LR (INSTRUMENTS) ×2 IMPLANT
MANIFOLD NEPTUNE II (INSTRUMENTS) ×2 IMPLANT
NEEDLE HYPO 18GX1.5 BLUNT FILL (NEEDLE) ×2 IMPLANT
NEEDLE HYPO 21X1.5 SAFETY (NEEDLE) ×2 IMPLANT
NS IRRIG 1000ML POUR BTL (IV SOLUTION) ×2 IMPLANT
PACK MINOR (CUSTOM PROCEDURE TRAY) ×2 IMPLANT
PAD ARMBOARD 7.5X6 YLW CONV (MISCELLANEOUS) ×2 IMPLANT
PENCIL SMOKE EVACUATOR (MISCELLANEOUS) ×2 IMPLANT
SET BASIN LINEN APH (SET/KITS/TRAYS/PACK) ×2 IMPLANT
SUT ETHIBOND NAB MO 7 #0 18IN (SUTURE) ×2 IMPLANT
SUT MNCRL AB 4-0 PS2 18 (SUTURE) ×2 IMPLANT
SUT VIC AB 3-0 SH 27 (SUTURE) ×2
SUT VIC AB 3-0 SH 27X BRD (SUTURE) ×2 IMPLANT
SYR 20ML LL LF (SYRINGE) ×4 IMPLANT

## 2021-04-03 NOTE — Progress Notes (Signed)
Results of CT scan called to MD from radiology.  Patient made NPO for reevaluation by surgery in AM.

## 2021-04-03 NOTE — Progress Notes (Signed)
PROGRESS NOTE  Stacey [name] [id number] DOB: [birth year] DOA: 03/24/2021 PCP: Sela Hilding   Brief History:  75 year old female with a history of coronary artery disease status post DES to the RCA December [id number], systolic & diastolic CHF, COPD, hypertension, metastatic breast cancer, hyperlipidemia, PSVT, chronic pain syndrome status post intrathecal pump placement presenting with abdominal pain that began on 03/24/2021 that was severe rating 10/10 diffuse in nature.  The patient has subjective fevers and chills.  She had nausea without emesis.  She denies any diarrhea.  She denies any headache, cough, hemoptysis, chest pain, worsening shortness of breath, hematochezia, melena, dysuria, hematuria. In the emergency department, the patient was afebrile hemodynamically stable with oxygen saturation 100% on 3 L.  CT of the abdomen and pelvis showed gallbladder wall thickening and edema with infiltration into the pericholecystic fat.  There was extrahepatic ductal dilatation with suggestion of stones in the distal CBD and moderate intrahepatic biliary ductal dilatation.  There was infiltration and thickening around the proximal duodenum probably referred from the gallbladder.  The patient was started on Zosyn.  GI and general surgery were consulted to assist.  Assessment/Plan:  Acute respiratory failure with hypercarbia - resolved after BiPAP therapy - likely side effect of anesthesia - CXR no acute findings - careful with opioids - continue supportive measures   Acute cholecystitis/  s/p Lap Chole  -General surgery consult appreciated-->lap chole 4/29 with Dr. Constance Haw -Completed IV Zosyn -tolerating soft diet -no BM yet, continue colace BID  S/p incarcerated umbilical hernia repair on 04/03/2021 by primary closure- -postop care per Dr. Donnella Bi with hyperbilirubinemia -GI consult appreciated -ERCP planned 03/28/21 (allowing plavix washout) -due to  cholecystitis/choledocholithiasis -ERCP was successful with 3 pigmented stones removed  Chronic systolic and diastolic CHF -Appears clinically euvolemic -04/18/2020 echo EF 35-40%, G1DD, mild to moderate AAS, mild MR -Continue metoprolol succinate -03/25/21 Echo EF 50-55%, G1DD, no WMA, trivial MR  Chronic respiratory failure with nocturnal hypoxia -Patient is normally on 1 L nasal cannula at nighttime, now on room air -Monitor clinically -Incentive spirometry  Coronary artery disease -No chest pain presently -plavix and aspirin temporarily on hold, resume when Hg stabilized postop -resumed home metoprolol succinate  Generalized weakness and deconditioning -Patient and family not interested in SNF hoping to go home with home health PT  Chronic pain syndrome -Patient has intrathecal morphine pump infusing 5.1 mg per 24-hour. -resume home dose oxycodone and Lyrica  Metastatic breast cancer to the bone -02/19/2021 bone scan--negative for bone metastasis -Outpatient follow-up with Dr. Delton Coombes  COPD -Stable  Chronic L1/L2 compression fracture -Continue home opioid regimen  Essential hypertension -Continue metoprolol succinate   Hyperlipidemia -Holding Lipitor secondary to elevated LFTs initially  Hypokalemia -repleted  Hypomagnesemia - repleted with IV treatment  Macrocytic anemia -B12 and folate within normal limits -baseline Hgb 11-12  Morbid Obesity -BMI 39.09 -lifestyle modification  Status is: Inpatient  Appropriate for inpatient due to severity of illness  Dispo: The patient is from: Home  Anticipated d/c is to: Home with [facility] services  Patient currently is not medically stable to d/c. Awaiting surgical clearance to restart plavix and aspirin              Difficult to place patient No  Family Communication:  spoke with son [name] at bedside and daughter by phone  Consultants:  GI, general surgery  Code  Status:  FULL   DVT Prophylaxis:  SCDs   Procedures:  As Listed in Progress Note Above  Antibiotics: Zosyn 4/25>>5/1  Subjective: -Resting improved umbilical repair -Pain issues improving  Objective: Vitals:   04/03/21 1445 04/03/21 1500 04/03/21 1515 04/03/21 1543  BP: (!) 156/84 132/83 132/83 (!) 165/73  Pulse: 80 80 80 79  Resp: 14 16 15 20   Temp:    (!) 97.5 F (36.4 C)  TempSrc:    Oral  SpO2: 100% 98% 100% 100%  Weight:      Height:        Intake/Output Summary (Last 24 hours) at 04/03/2021 1808 Last data filed at 04/03/2021 1400 Gross per 24 hour  Intake 920 ml  Output 1202 ml  Net -282 ml   Weight change:  Exam:   Physical Exam  Gen:- Awake Alert, no acute distress HEENT:- [place].AT, No sclera icterus Neck-Supple Neck,No JVD,.  Lungs-  CTAB, fair air movement CV- S1, S2 normal, irregular Abd-  +ve B.Sounds, Abd Soft, appropriate postop tenderness, postop wound intact Extremity/Skin:- No  edema,   good pulses Psych-affect is appropriate, oriented x3 Neuro-generalized weakness no new focal deficits, no tremors   Data Reviewed: I have personally reviewed following labs and imaging studies Basic Metabolic Panel: Recent Labs  Lab 03/28/21 0430 03/29/21 0432 03/30/21 0415 03/31/21 0350 04/01/21 0509 04/02/21 0410 04/03/21 0333  NA 143 141 143 138 138 138 141  K 4.0 4.1 3.5 4.1 4.0 3.5 3.8  CL 103 103 103 100 101 100 102  CO2 33* 27 31 30 31  32 33*  GLUCOSE 97 151* 126* 102* 112* 115* 115*  BUN 9 15 15 15 12 11 11   CREATININE 0.67 0.85 0.87 0.64 0.59 0.59 0.66  CALCIUM 9.7 9.8 9.3 8.7* 8.7* 8.6* 8.8*  MG 1.7 1.7 1.6*  --  1.7  --   --    Liver Function Tests: Recent Labs  Lab 03/28/21 0430 03/29/21 0432 03/30/21 0415 03/31/21 0350 04/01/21 0509  AST 56* 48* 117* 72* 40  ALT 82* 65* 96* 71* 47*  ALKPHOS 84 83 76 68 64  BILITOT 1.3* 1.0 1.3* 2.0* 0.9  PROT 5.9* 6.1* 5.9* 5.6* 5.2*  ALBUMIN 3.1* 3.3* 3.1* 2.8* 2.4*   No results for  input(s): LIPASE, AMYLASE in the last 168 hours. No results for input(s): AMMONIA in the last 168 hours. Coagulation Profile: No results for input(s): INR, PROTIME in the last 168 hours. CBC: Recent Labs  Lab 03/28/21 0430 03/29/21 0432 03/30/21 0415 03/31/21 0350 04/01/21 0509 04/02/21 0410 04/03/21 0333  WBC 4.9 4.3 8.5 7.4 6.2 5.0 4.6  NEUTROABS 2.4 2.7 6.8  --   --  3.1  --   HGB 10.8* 11.0* 11.1* 9.7* 8.8* 8.3* 8.2*  HCT 33.0* 33.3* 35.2* 30.0* 27.0* 25.8* 25.4*  MCV 98.8 98.8 102.6* 101.4* 101.1* 101.2* 102.0*  PLT 189 195 166 155 146* 162 189   Cardiac Enzymes: No results for input(s): CKTOTAL, CKMB, CKMBINDEX, TROPONINI in the last 168 hours. BNP: Invalid input(s): POCBNP CBG: Recent Labs  Lab 03/28/21 1420  GLUCAP 160*   HbA1C: No results for input(s): HGBA1C in the last 72 hours. Urine analysis:    Component Value Date/Time   COLORURINE AMBER (A) 03/24/2021 2201   APPEARANCEUR CLOUDY (A) 03/24/2021 2201   LABSPEC 1.038 (H) 03/24/2021 2201   PHURINE 5.0 03/24/2021 2201   GLUCOSEU 50 (A) 03/24/2021 2201   HGBUR NEGATIVE 03/24/2021 2201   BILIRUBINUR NEGATIVE 03/24/2021 2201   KETONESUR 5 (A) 03/24/2021 2201   PROTEINUR 100 (A) 03/24/2021 2201  NITRITE NEGATIVE 03/24/2021 2201   LEUKOCYTESUR NEGATIVE 03/24/2021 2201   Sepsis Labs:  Recent Results (from the past 240 hour(s))  Resp Panel by RT-PCR (Flu A&B, Covid) Nasopharyngeal Swab     Status: None   Collection Time: 03/25/21  1:48 AM   Specimen: Nasopharyngeal Swab; Nasopharyngeal(NP) swabs in vial transport medium  Result Value Ref Range Status   SARS Coronavirus 2 by RT PCR NEGATIVE NEGATIVE Final    Comment: (NOTE) SARS-CoV-2 target nucleic acids are NOT DETECTED.  The SARS-CoV-2 RNA is generally detectable in upper respiratory specimens during the acute phase of infection. The lowest concentration of SARS-CoV-2 viral copies this assay can detect is 138 copies/mL. A negative result does not  preclude SARS-Cov-2 infection and should not be used as the sole basis for treatment or other patient management decisions. A negative result may occur with  improper specimen collection/handling, submission of specimen other than nasopharyngeal swab, presence of viral mutation(s) within the areas targeted by this assay, and inadequate number of viral copies(<138 copies/mL). A negative result must be combined with clinical observations, patient history, and epidemiological information. The expected result is Negative.  Fact Sheet for Patients:  EntrepreneurPulse.com.au  Fact Sheet for Healthcare Providers:  IncredibleEmployment.be  This test is no t yet approved or cleared by the [place] FDA and  has been authorized for detection and/or diagnosis of SARS-CoV-2 by FDA under an Emergency Use Authorization (EUA). This EUA will remain  in effect (meaning this test can be used) for the duration of the COVID-19 declaration under Section 564(b)(1) of the Act, 21 U.S.C.section 360bbb-3(b)(1), unless the authorization is terminated  or revoked sooner.       Influenza A by PCR NEGATIVE NEGATIVE Final   Influenza B by PCR NEGATIVE NEGATIVE Final    Comment: (NOTE) The Xpert Xpress SARS-CoV-2/FLU/RSV plus assay is intended as an aid in the diagnosis of influenza from Nasopharyngeal swab specimens and should not be used as a sole basis for treatment. Nasal washings and aspirates are unacceptable for Xpert Xpress SARS-CoV-2/FLU/RSV testing.  Fact Sheet for Patients: EntrepreneurPulse.com.au  Fact Sheet for Healthcare Providers: IncredibleEmployment.be  This test is not yet approved or cleared by the [place] FDA and has been authorized for detection and/or diagnosis of SARS-CoV-2 by FDA under an Emergency Use Authorization (EUA). This EUA will remain in effect (meaning this test can be used) for the  duration of the COVID-19 declaration under Section 564(b)(1) of the Act, 21 U.S.C. section 360bbb-3(b)(1), unless the authorization is terminated or revoked.  Performed at [facility], [address] [place], [place] [address]   Surgical pcr screen     Status: Abnormal   Collection Time: 03/29/21  8:41 AM   Specimen: Nasal Mucosa; Nasal Swab  Result Value Ref Range Status   MRSA, PCR POSITIVE (A) NEGATIVE Final    Comment: RESULT CALLED TO, READ BACK BY AND VERIFIED WITH: TORI RN @ 1118 ON [id number] BY HENDERSON L.    Staphylococcus aureus POSITIVE (A) NEGATIVE Final    Comment: RESULT CALLED TO, READ BACK BY AND VERIFIED WITH: TORI RN @ 1245 ON [id number] BY HENDERSON L. (NOTE) The Xpert SA Assay (FDA approved for NASAL specimens in patients 89 years of age and older), is one component of a comprehensive surveillance program. It is not intended to diagnose infection nor to guide or monitor treatment. Performed at [facility], [address], [place], [place] [address]      Scheduled Meds: . vitamin C  500 mg Oral Daily  . [START ON 04/05/2021] aspirin EC  81 mg Oral Daily  . atorvastatin  80 mg Oral QPM  . chlorhexidine      . Chlorhexidine Gluconate Cloth  6 each Topical Daily  . diclofenac Sodium  2 g Topical QID  . docusate sodium  100 mg Oral BID  . DULoxetine  90 mg Oral Daily  . metoprolol succinate  150 mg Oral Daily  . multivitamin with minerals  1 tablet Oral Daily  . mupirocin ointment  1 application Nasal BID  . polyvinyl alcohol  1 drop Both Eyes TID  . pregabalin  50 mg Oral TID   Continuous Infusions:   Procedures/Studies: CT ABDOMEN PELVIS W CONTRAST  Result Date: 04/02/2021 CLINICAL DATA:  Recent laparoscopic cholecystectomy, abdominal pain, umbilical firmness EXAM: CT ABDOMEN AND PELVIS WITH CONTRAST TECHNIQUE: Multidetector CT imaging of the abdomen and pelvis was performed using the standard protocol following bolus administration of intravenous contrast.  CONTRAST:  84mL OMNIPAQUE IOHEXOL 300 MG/ML  SOLN COMPARISON:  03/25/2021 FINDINGS: Lower chest: Hypoventilatory changes are seen at the lung bases. Hepatobiliary: Postsurgical changes are seen from cholecystectomy. Gas and fluid within the gallbladder fossa measuring up to 6.6 x 5.8 cm consistent with recent surgery. No peripheral enhancement to suggest abscess. Diffuse pneumobilia consistent with recent bile duct manipulation. The liver is unremarkable. Pancreas: Unremarkable. No pancreatic ductal dilatation or surrounding inflammatory changes. Spleen: Normal in size without focal abnormality. Adrenals/Urinary Tract: Suspected renal cell carcinoma upper pole left kidney measuring 2 cm on image 23. Right kidney is unremarkable. No urinary tract calculi or obstructive uropathy. The adrenals and bladder are normal. Stomach/Bowel: No bowel obstruction or ileus. Scattered distal colonic diverticulosis without diverticulitis. No bowel wall thickening or inflammatory change. Vascular/Lymphatic: Aortic atherosclerosis. No enlarged abdominal or pelvic lymph nodes. Reproductive: Status post hysterectomy. No adnexal masses. Other: A fat containing umbilical hernia is identified. Since the prior exam, there is significant fat stranding and inflammatory change within the herniated fat, concerning for incarceration. No bowel herniation. There is no free fluid or free intraperitoneal gas. Musculoskeletal: Stable subacute L1 compression fracture and retropulsion with greater than 75% narrowing of the central canal. Stable sclerosis within the L2 vertebral body and left inferior pubic ramus concerning for metastatic disease. Reconstructed images demonstrate no additional findings. IMPRESSION: 1. Incarcerated fat containing umbilical hernia. No bowel herniation. 2. Postoperative changes from cholecystectomy, with likely postoperative hematoma or seroma within the gallbladder fossa. No evidence of abscess at this time. 3. Stable  presumed left renal cell carcinoma measuring 2 cm. 4. Stable subacute L1 compression deformity with retropulsion and significant central canal stenosis unchanged. 5. Stable sclerotic lesions within the L2 vertebral body and left inferior pubic ramus, consistent with bony metastatic disease in a patient with a history of breast cancer. 6.  Aortic Atherosclerosis (ICD10-I70.0). These results will be called to the ordering clinician or representative by the Radiologist Assistant, and communication documented in the PACS or [employer]. Electronically Signed   By: Randa Ngo M.D.   On: 04/02/2021 22:25   CT ABDOMEN PELVIS W CONTRAST  Result Date: 03/25/2021 CLINICAL DATA:  Acute abdominal pain. History of MI, hypertension, congestive heart failure, breast cancer. EXAM: CT ABDOMEN AND PELVIS WITH CONTRAST TECHNIQUE: Multidetector CT imaging of the abdomen and pelvis was performed using the standard protocol following bolus administration of intravenous contrast. CONTRAST:  184mL OMNIPAQUE IOHEXOL 300 MG/ML  SOLN COMPARISON:  02/19/2021 FINDINGS: Lower chest: Lung bases are clear. Hepatobiliary:  Gallbladder wall thickening and edema with infiltration into the pericholecystic fat. Small stones layering in the gallbladder. Extrahepatic bile duct dilatation with suggestion of stones in the distal common bile duct. Moderate intrahepatic bile duct dilatation. No focal liver lesions. Small amount of perihepatic ascites. Pancreas: Unremarkable. No pancreatic ductal dilatation or surrounding inflammatory changes. Spleen: Normal in size without focal abnormality. Adrenals/Urinary Tract: No adrenal gland nodules. Complex hypoenhancing lesion demonstrated in the left kidney measuring 1.9 cm diameter. This may represent a renal cell carcinoma. Similar appearance to previous study. No hydronephrosis or hydroureter. Bladder wall is thickened, probably due to under distention although cystitis could also have this  appearance. Stomach/Bowel: Stomach, small bowel, and colon are not abnormally distended. Scattered colonic diverticula without evidence of diverticulitis. Infiltration and wall thickening around the proximal duodenum probably is referred inflammation from the gallbladder. Duodenitis could also be present. Appendix is not identified. Vascular/Lymphatic: Aortic atherosclerosis. No enlarged abdominal or pelvic lymph nodes. Reproductive: Uterus is surgically absent. No abnormal adnexal masses. Other: No free air in the abdomen. Moderate-sized periumbilical hernia containing fat. Musculoskeletal: Degenerative changes in the spine. Lumbar scoliosis convex towards the left. Sclerotic bone lesions consistent with metastatic disease and vertebral compression of L2. IMPRESSION: 1. Cholelithiasis with changes of acute cholecystitis. Bile duct dilatation with probable stones in the distal common bile duct. 2. Infiltration and wall thickening around the proximal duodenum probably is referred inflammation from the gallbladder. Duodenitis could also have this appearance. 3. Complex hypoenhancing lesion in the left kidney may represent a renal cell carcinoma. No change since prior study. 4. Sclerotic bone lesions consistent with metastatic disease. Compression of L2. 5. Moderate-sized periumbilical hernia containing fat. 6. Aortic atherosclerosis. Aortic Atherosclerosis (ICD10-I70.0). Electronically Signed   By: Lucienne Capers M.D.   On: 03/25/2021 00:50   DG CHEST PORT 1 VIEW  Result Date: 03/30/2021 CLINICAL DATA:  Acute on chronic respiratory failure.  Hypercapnia. EXAM: PORTABLE CHEST 1 VIEW COMPARISON:  November 15, 2019 FINDINGS: The heart size and mediastinal contours are within normal limits. Both lungs are clear. The visualized skeletal structures are unremarkable. IMPRESSION: No active disease. Electronically Signed   By: Dorise Bullion III M.D   On: 03/30/2021 08:52   DG ERCP BILIARY & PANCREATIC DUCTS  Result  Date: 03/28/2021 CLINICAL DATA:  Choledocholithiasis EXAM: ERCP TECHNIQUE: Multiple spot images obtained with the fluoroscopic device and submitted for interpretation post-procedure. COMPARISON:  CT abdomen pelvis-03/25/2021 FLUOROSCOPY TIME:  2 minutes, 2 seconds (39.4 mGy) FINDINGS: Multiple spot fluoroscopic images the right upper abdominal quadrant during ERCP are provided for review. Initial image demonstrates an ERCP probe overlying the right upper abdominal quadrant. Subsequent images demonstrate selective cannulation opacification of the common duct which appears moderately dilated. There are several transient nonocclusive filling defects within the CBD which could represent air bubbles versus nonocclusive choledocholithiasis. Subsequent images demonstrate insufflation of a balloon within the central aspect of the CBD with subsequent biliary sweeping and presumed stone extraction and sphincterotomy There is minimal opacification of the central aspect of the intrahepatic biliary tree which appears mildly dilated. There is faint opacification of the gallbladder lumen. There is no definitive opacification of the pancreatic duct. IMPRESSION: ERCP with biliary sweeping and presumed stone extraction and sphincterotomy as above. These images were submitted for radiologic interpretation only. Please see the procedural report for the amount of contrast and the fluoroscopy time utilized. Electronically Signed   By: Sandi Mariscal M.D.   On: 03/28/2021 14:23   ECHOCARDIOGRAM COMPLETE  Result Date:  03/25/2021    ECHOCARDIOGRAM REPORT   Patient Name:   KAYLANN BEHNER Date of Exam: 03/25/2021 Medical Rec #:  [id number]         Height:       60.0 in Accession #:    [id number]        Weight:       200.2 lb Date of Birth:  Aug 19, 1946          BSA:          1.867 m Patient Age:    70 years          BP:           141/72 mmHg Patient Gender: F                 HR:           71 bpm. Exam Location:  [facility] Procedure: 2D Echo,  Cardiac Doppler and Color Doppler Indications:    CHF  History:        Patient has prior history of Echocardiogram examinations, most                 recent 04/18/2020. CHF, CAD, COPD, Aortic Valve Disease and                 Aortic stenosis; Risk Factors:Hypertension, Dyslipidemia and                 morbid obestiy. Metastatic breast cancer.  Sonographer:    Dustin Flock RDCS Referring Phys: [phone] DAVID TAT IMPRESSIONS  1. Left ventricular ejection fraction, by estimation, is 50 to 55%. The left ventricle has low normal function. The left ventricle has no regional wall motion abnormalities. Left ventricular diastolic parameters are consistent with Grade I diastolic dysfunction (impaired relaxation).  2. Right ventricular systolic function is normal. The right ventricular size is normal. There is mildly elevated pulmonary artery systolic pressure. The estimated right ventricular systolic pressure is 0000000 mmHg.  3. Left atrial size was upper normal.  4. The mitral valve is grossly normal. Trivial mitral valve regurgitation.  5. The aortic valve is tricuspid with some fusion of left and noncoronary cusps. There is moderate calcification of the aortic valve. Aortic valve regurgitation is not visualized. Moderate aortic valve stenosis. Aortic valve mean gradient measures 10.5 mmHg. Dimentionless index 0.31.  6. The inferior vena cava is normal in size with greater than 50% respiratory variability, suggesting right atrial pressure of 3 mmHg. FINDINGS  Left Ventricle: Left ventricular ejection fraction, by estimation, is 50 to 55%. The left ventricle has low normal function. The left ventricle has no regional wall motion abnormalities. The left ventricular internal cavity size was normal in size. There is borderline left ventricular hypertrophy. Abnormal (paradoxical) septal motion, consistent with left bundle branch block. Left ventricular diastolic parameters are consistent with Grade I diastolic dysfunction  (impaired relaxation). Right Ventricle: The right ventricular size is normal. No increase in right ventricular wall thickness. Right ventricular systolic function is normal. There is mildly elevated pulmonary artery systolic pressure. The tricuspid regurgitant velocity is 3.24  m/s, and with an assumed right atrial pressure of 3 mmHg, the estimated right ventricular systolic pressure is 0000000 mmHg. Left Atrium: Left atrial size was upper normal. Right Atrium: Right atrial size was normal in size. Pericardium: There is no evidence of pericardial effusion. Mitral Valve: The mitral valve is grossly normal. Mild mitral annular calcification. Trivial mitral valve regurgitation. Tricuspid Valve: The tricuspid valve is grossly  normal. Tricuspid valve regurgitation is mild. Aortic Valve: The aortic valve is tricuspid. There is moderate calcification of the aortic valve. There is moderate aortic valve annular calcification. Aortic valve regurgitation is not visualized. Moderate aortic stenosis is present. Aortic valve mean gradient measures 10.5 mmHg. Aortic valve peak gradient measures 18.1 mmHg. Aortic valve area, by VTI measures 1.09 cm. Pulmonic Valve: The pulmonic valve was grossly normal. Pulmonic valve regurgitation is trivial. Aorta: The aortic root is normal in size and structure. Venous: The inferior vena cava is normal in size with greater than 50% respiratory variability, suggesting right atrial pressure of 3 mmHg. IAS/Shunts: No atrial level shunt detected by color flow Doppler.  LEFT VENTRICLE PLAX 2D LVIDd:         4.35 cm  Diastology LVIDs:         3.18 cm  LV e' medial:    5.44 cm/s LV PW:         1.05 cm  LV E/e' medial:  11.7 LV IVS:        1.08 cm  LV e' lateral:   7.40 cm/s LVOT diam:     2.10 cm  LV E/e' lateral: 8.6 LV SV:         52 LV SV Index:   28 LVOT Area:     3.46 cm  RIGHT VENTRICLE RV Basal diam:  3.00 cm RV S prime:     7.07 cm/s TAPSE (M-mode): 2.1 cm LEFT ATRIUM             Index        RIGHT ATRIUM           Index LA diam:        3.80 cm 2.04 cm/m  RA Area:     11.70 cm LA Vol (A2C):   37.5 ml 20.08 ml/m RA Volume:   32.00 ml  17.14 ml/m LA Vol (A4C):   60.0 ml 32.13 ml/m LA Biplane Vol: 50.8 ml 27.20 ml/m  AORTIC VALVE AV Area (Vmax):    1.10 cm AV Area (Vmean):   1.16 cm AV Area (VTI):     1.09 cm AV Vmax:           213.00 cm/s AV Vmean:          149.000 cm/s AV VTI:            0.477 m AV Peak Grad:      18.1 mmHg AV Mean Grad:      10.5 mmHg LVOT Vmax:         67.90 cm/s LVOT Vmean:        50.000 cm/s LVOT VTI:          0.150 m LVOT/AV VTI ratio: 0.31  AORTA Ao Root diam: 2.70 cm MITRAL VALVE               TRICUSPID VALVE MV Area (PHT): 2.68 cm    TR Peak grad:   42.0 mmHg MV Decel Time: 283 msec    TR Vmax:        324.00 cm/s MV E velocity: 63.80 cm/s MV A velocity: 89.50 cm/s  SHUNTS MV E/A ratio:  0.71        Systemic VTI:  0.15 m                            Systemic Diam: 2.10 cm Rozann Lesches MD Electronically signed by Rozann Lesches MD Signature  Date/Time: 03/25/2021/2:24:21 PM    Final    Roxan Hockey, MD  [facility]  If 7PM-7AM, please contact night-coverage [email or web address] Password [email or web address] 04/03/2021, 6:08 PM   LOS: 9 days

## 2021-04-03 NOTE — Anesthesia Procedure Notes (Signed)
Procedure Name: LMA Insertion Date/Time: 04/03/2021 12:47 PM Performed by: Jonna Munro, CRNA Pre-anesthesia Checklist: Patient identified, Emergency Drugs available, Suction available, Patient being monitored and Timeout performed Patient Re-evaluated:Patient Re-evaluated prior to induction Oxygen Delivery Method: Circle system utilized Preoxygenation: Pre-oxygenation with 100% oxygen Induction Type: IV induction LMA: LMA inserted LMA Size: 3.0 Number of attempts: 1 Placement Confirmation: positive ETCO2 and breath sounds checked- equal and bilateral Tube secured with: Tape Dental Injury: Teeth and Oropharynx as per pre-operative assessment

## 2021-04-03 NOTE — Progress Notes (Signed)
[facility]  Patient with incarcerated umbilical hernia with fat on CT. Having pain at the site and unable to reduce anything. Discussed risk of going through anesthesia again, risk of bleeding, infection, recurrence, use of mesh, and fact that we are going through another anesthesia but this will not resolve on its on and will not get better.  Discussed with patient, [name] and [place].  Patient is reluctant to do surgery because of anesthesia but is having pain. She is chronically in pain. She had the hernia prior to her lap cholecystectomy and unfortunately due to her body habitus it was not appreciated during the trocar placement.  She likely had some irritation of this from the trocar versus worsening hernia with fat from pain and pressure on the abdomen.  BP (!) 158/70 (BP Location: Left Arm)   Pulse 83   Temp 98 F (36.7 C) (Oral)   Resp 16   Ht 5' (1.524 m)   Wt 92.8 kg   SpO2 98%   BMI 39.96 kg/m  Soft, tender umbilical hernia, incarcerated fat hernia, port site c/d/i with dermabond, bruising around the area   Curlene Labrum, MD [facility] [address] [place], [place] [address] [phone] (office)

## 2021-04-03 NOTE — Progress Notes (Signed)
PT Cancellation Note  Patient Details Name: Stacey [name] MRN: [id number] DOB: 1946/05/29   Cancelled Treatment:    Reason Eval/Treat Not Completed: Patient at procedure or test/unavailable.  Patient out for surgery, will resume with clearance from general surgeon MD.   11:55 AM, 04/03/21 Lonell Grandchild, MPT Physical Therapist with [facility] [phone] office [phone] mobile phone

## 2021-04-03 NOTE — Progress Notes (Signed)
Pt refuses Bipap

## 2021-04-03 NOTE — Transfer of Care (Signed)
Immediate Anesthesia Transfer of Care Note  Patient: [name]  Procedure(s) Performed: ADULT PRIMARY UMBILICAL HERNIA REPAIR (N/A Abdomen)  Patient Location: PACU  Anesthesia Type:General  Level of Consciousness: awake, alert , oriented and patient cooperative  Airway & Oxygen Therapy: Patient Spontanous Breathing and Patient connected to face mask oxygen  Post-op Assessment: Report given to RN, Post -op Vital signs reviewed and stable and Patient moving all extremities X 4  Post vital signs: Reviewed and stable  Last Vitals:  Vitals Value Taken Time  BP 151/66 04/03/21 1333  Temp    Pulse 84 04/03/21 1336  Resp 16 04/03/21 1336  SpO2 100 % 04/03/21 1336  Vitals shown include unvalidated device data.  Last Pain:  Vitals:   04/03/21 1122  TempSrc: Oral  PainSc: 0-No pain      Patients Stated Pain Goal: 2 (35/59/74 1638)  Complications: No complications documented.

## 2021-04-03 NOTE — Op Note (Signed)
[facility] Operative Note  04/03/21  Preoperative Diagnosis: Incarcerated Umbilical hernia    Postoperative Diagnosis: Same   Procedure(s) Performed: Primary umbilical hernia repair    Surgeon: Lanell Matar. Constance Haw, MD   Assistants: No qualified resident was available    Anesthesia: General endotracheal   Anesthesiologist: Louann Sjogren, MD    Specimens: None    Estimated Blood Loss: Minimal   Blood Replacement: None    Complications: None   Wound Class: Clean    Operative Indications: Ms. Babino I a 75 yo who had a laparoscopic cholecystectomy and had a umbilical hernia on her preop CT and I went through the umbilical area during my trocar placement trying to close the area after the case. We even pulled fat down from the umbilical area intraperitoneal. Unfortunately she had worsening hernia on repeating imaging and exam and pain. CT showed an incarcerated omentum in the hernia sac. Given this we decided to go back for a formal hernia repair. Discussed risk of bleeding, infection, recurrence, use of mesh.   Findings: large hernia sac with 1cm defect, omentum in the sac, some hematoma from prior entry    Procedure: The patient was taken to the operating room and placed supine. General endotracheal anesthesia was induced. Intravenous antibiotics were administered per protocol.  The abdomen was prepared and draped in the usual sterile fashion.   The umbilical hernia was noted to be incarcerated.  The prior supraumbilical incision was opened with scissors and extended with a scalp.  It was carried down through the subcutaneous tissue with electrocautery.  Dissection was performed down to the level of the fascia, exposing the hernia sac. There was some minor post operative hematoma in the area.  The hernia sac was opened with care, and excess hernia sac was resected with electrocautery. The omentum in the sac was ligated with a Ligasure. The defect was just let and  superior to the stalk.  A finger was ran on the underlying peritoneum and this was clear.  Given the prior surgery and hematoma, I did not use a mesh due to risk of infection. The defect was also only about 1 cm. The hernia defect was then closed with 0 Ethibond suture in an interrupted fashion.  The umbilical stalk remained intact.  Exparel was injected.  Hemostasis was confirmed. The deep space was closed with 3-0 interrupted Vicryl. The skin was closed with a running 4-0 Monocryl suture and dermabond.   ll counts were correct at the end of the case. The patient was awakened from anesthesia and extubated without complication.  The patient went to the PACU in stable condition.  Curlene Labrum, MD [facility] [address] [place], [place] [address] [phone] (office)

## 2021-04-03 NOTE — Anesthesia Preprocedure Evaluation (Signed)
Anesthesia Evaluation  Patient identified by MRN, date of birth, ID band Patient awake    Reviewed: Allergy & Precautions, NPO status , Patient's Chart, lab work & pertinent test results, reviewed documented beta blocker date and time   Airway Mallampati: I  TM Distance: >3 FB Neck ROM: Full    Dental  (+) Dental Advisory Given, Upper Dentures, Partial Lower   Pulmonary shortness of breath and with exertion, COPD, Patient abstained from smoking., former smoker,    Pulmonary exam normal breath sounds clear to auscultation       Cardiovascular Exercise Tolerance: Poor hypertension, Pt. on medications and Pt. on home beta blockers + CAD, + Past MI, + Cardiac Stents and +CHF  + dysrhythmias Atrial Fibrillation and Supra Ventricular Tachycardia + Valvular Problems/Murmurs AS  Rhythm:Regular Rate:Normal + Systolic murmurs 1. Left ventricular ejection fraction, by estimation, is 50 to 55%. The left ventricle has low normal function. The left ventricle has no regional wall motion abnormalities. Left ventricular diastolic parameters are consistent with Grade I diastolic dysfunction (impaired relaxation).  2. Right ventricular systolic function is normal. The right ventricular size is normal. There is mildly elevated pulmonary artery systolic pressure. The estimated right ventricular systolic pressure is 73.7 mmHg.  3. Left atrial size was upper normal.  4. The mitral valve is grossly normal. Trivial mitral valve  regurgitation.  5. The aortic valve is tricuspid with some fusion of left and noncoronary cusps. There is moderate calcification of the aortic valve. Aortic valve regurgitation is not visualized. Moderate aortic valve stenosis. Aortic valve mean gradient measures 10.5 mmHg. Dimentionless index 0.31.  6. The inferior vena cava is normal in size with greater than 50% respiratory variability, suggesting right atrial pressure of 3 mmHg.    25-Mar-2021 17:29:13 [facility]-AP-300 ROUTINE RECORD 1946/08/03 (54 yr) Female Caucasian Room:A309 Loc:903 Technician: Harlow Asa Test ind: Vent. rate 75 BPM PR interval * ms QRS duration 146 ms QT/QTcB 410/457 ms P-R-T axes * -40 142 Normal sinus rhythm Left axis deviation Left bundle branch block Abnormal ECG Confirmed by Asencion Noble [phone]) on 03/26/2021 7:44:19 PM   Neuro/Psych PSYCHIATRIC DISORDERS Anxiety Depression Bipolar Disorder  Neuromuscular disease    GI/Hepatic negative GI ROS, Neg liver ROS,   Endo/Other  negative endocrine ROS  Renal/GU negative Renal ROS     Musculoskeletal  (+) Arthritis  (lumbar radiculopathy),   Abdominal   Peds  Hematology negative hematology ROS (+)   Anesthesia Other Findings Cardiology initially consulted for preoperative cardiac clearance for upcoming ERCP. Was cleared to proceed as outlined in full consult note from 03/25/2021. Was recommended to continue at least one antiplatelet peri-procedure then resume DAPT once deemed safe from a surgical perspective.   [facility] will sign off. Please re-consult if needed.  Medication Recommendations: As outlined on 03/25/2021. Other recommendations (labs, testing, etc): None Follow up as an outpatient: Keep routine outpatient follow-up with Dr. Harl Bowie  Metastatic breast cancer     Reproductive/Obstetrics negative OB ROS                             Anesthesia Physical  Anesthesia Plan  ASA: III  Anesthesia Plan: General   Post-op Pain Management:    Induction: Intravenous  PONV Risk Score and Plan: 4 or greater and Ondansetron and Dexamethasone  Airway Management Planned: Oral ETT  Additional Equipment:   Intra-op Plan:   Post-operative Plan: Extubation in OR  Informed Consent:  I have reviewed the patients History and Physical, chart, labs and discussed the procedure including the risks, benefits and  alternatives for the proposed anesthesia with the patient or authorized representative who has indicated his/her understanding and acceptance.     Dental advisory given  Plan Discussed with: CRNA and Surgeon  Anesthesia Plan Comments:         Anesthesia Quick Evaluation

## 2021-04-03 NOTE — Progress Notes (Signed)
[facility]  Updated [name] about surgery. Repaired primarily and no mesh used. Soft diet ordered.  Patient had been slow to wake up before and will have RN call him to let him know when she goes upstairs.   Curlene Labrum, MD [facility] [address] [place], [place] [address] [phone] (office)

## 2021-04-03 NOTE — Anesthesia Postprocedure Evaluation (Signed)
Anesthesia Post Note  Patient: Stacey [name]  Procedure(s) Performed: ADULT PRIMARY UMBILICAL HERNIA REPAIR (N/A Abdomen)  Patient location during evaluation: Phase II Anesthesia Type: General Level of consciousness: awake Pain management: pain level controlled Vital Signs Assessment: post-procedure vital signs reviewed and stable Respiratory status: spontaneous breathing and respiratory function stable Cardiovascular status: blood pressure returned to baseline and stable Postop Assessment: no headache and no apparent nausea or vomiting Anesthetic complications: no Comments: Late entry   No complications documented.   Last Vitals:  Vitals:   04/03/21 1333 04/03/21 1345  BP: (!) 151/66 (!) 158/70  Pulse: 84 85  Resp: 18 16  Temp: 36.8 C   SpO2: 100% 100%    Last Pain:  Vitals:   04/03/21 1345  TempSrc:   PainSc: 0-No pain                 Louann Sjogren

## 2021-04-03 NOTE — Progress Notes (Signed)
From surgery for hernia repair and skin glue intact with no drainage and patient alret and oriented.

## 2021-04-04 ENCOUNTER — Encounter (HOSPITAL_COMMUNITY): Payer: Self-pay | Admitting: General Surgery

## 2021-04-04 ENCOUNTER — Ambulatory Visit (HOSPITAL_COMMUNITY): Payer: Medicare Other

## 2021-04-04 DIAGNOSIS — K8001 Calculus of gallbladder with acute cholecystitis with obstruction: Secondary | ICD-10-CM | POA: Diagnosis not present

## 2021-04-04 LAB — COMPREHENSIVE METABOLIC PANEL
ALT: 19 U/L (ref 0–44)
AST: 18 U/L (ref 15–41)
Albumin: 2.3 g/dL — ABNORMAL LOW (ref 3.5–5.0)
Alkaline Phosphatase: 60 U/L (ref 38–126)
Anion gap: 6 (ref 5–15)
BUN: 8 mg/dL (ref 8–23)
CO2: 33 mmol/L — ABNORMAL HIGH (ref 22–32)
Calcium: 9.2 mg/dL (ref 8.9–10.3)
Chloride: 100 mmol/L (ref 98–111)
Creatinine, Ser: 0.65 mg/dL (ref 0.44–1.00)
GFR, Estimated: >60 mL/min (ref >60)
Glucose, Bld: 91 mg/dL (ref 70–99)
Potassium: 4.8 mmol/L (ref 3.5–5.1)
Sodium: 139 mmol/L (ref 135–145)
Total Bilirubin: 0.9 mg/dL (ref 0.3–1.2)
Total Protein: 5.2 g/dL — ABNORMAL LOW (ref 6.5–8.1)

## 2021-04-04 LAB — CBC
HCT: 27 % — ABNORMAL LOW (ref 36.0–46.0)
Hemoglobin: 8.6 g/dL — ABNORMAL LOW (ref 12.0–15.0)
MCH: 32.5 pg (ref 26.0–34.0)
MCHC: 31.9 g/dL (ref 30.0–36.0)
MCV: 101.9 fL — ABNORMAL HIGH (ref 80.0–100.0)
Platelets: 214 10*3/uL (ref 150–400)
RBC: 2.65 MIL/uL — ABNORMAL LOW (ref 3.87–5.11)
RDW: 13.9 % (ref 11.5–15.5)
WBC: 4.8 10*3/uL (ref 4.0–10.5)
nRBC: 0 % (ref 0.0–0.2)

## 2021-04-04 MED ORDER — CLOPIDOGREL BISULFATE 75 MG PO TABS
75.0000 mg | ORAL_TABLET | Freq: Every day | ORAL | Status: DC
  Administered 2021-04-04 – 2021-04-05 (×2): 75 mg via ORAL
  Filled 2021-04-04 (×2): qty 1

## 2021-04-04 NOTE — Progress Notes (Signed)
Physical Therapy Treatment Patient Details Name: Stacey [name] MRN: [id number] DOB: 05/09/1946 Today's Date: 04/04/2021    History of Present Illness Stacey [name] is a 75 y.o. female s/p  Primary umbilical hernia repair on 04/03/21, Laparoscopic cholecystectomy on 03/29/21 with c/o abdominal pain, some nausea. CT noted cholelithiasis, possible surgical intervention to follow. PMH: metastatic breast cancer with a pain pump in place, HTN, CAD, CHF, COPD, MI, afib    PT Comments    REASSESSMENT: Patient demonstrates much improvement for sitting up at bedside with use of bed rail and HOB raised with only minor c/o abdominal pain while wearing abdominal binder, good return for scooting to EOB, able to complete sit to stands with Min assist and limited to a few side steps at bedside due to fatigue and BLE weakness.  Patient tolerated sitting up in chair after therapy and encouraged to stay out of bed as much as tolerated here in hospital and when return home.  Patient will benefit from continued physical therapy in hospital and recommended venue below to increase strength, balance, endurance for safe ADLs and gait.    Follow Up Recommendations  Home health PT;Supervision/Assistance - 24 hour     Equipment Recommendations  None recommended by PT    Recommendations for Other Services       Precautions / Restrictions Precautions Precautions: Fall Restrictions Weight Bearing Restrictions: No    Mobility  Bed Mobility Overal bed mobility: Needs Assistance Bed Mobility: Rolling;Sidelying to Sit Rolling: Min guard Sidelying to sit: Min assist;HOB elevated       General bed mobility comments: demonstrates fair/good return for rolling to side and sitting up from sidelying position with use of bed rail and HOB raised    Transfers Overall transfer level: Needs assistance Equipment used: Rolling walker (2 wheeled) Transfers: Sit to/from [facility] Sit to Stand: Min  assist Stand pivot transfers: Min assist;Mod assist       General transfer comment: required a couple of attempts and able complete sit to stands from bedside using mostly BUE, once standing able to maintain balance  Ambulation/Gait Ambulation/Gait assistance: Min assist;Mod assist Gait Distance (Feet): 6 Feet Assistive device: Rolling walker (2 wheeled) Gait Pattern/deviations: Decreased step length - right;Decreased step length - left;Decreased stride length Gait velocity: decreased   General Gait Details: able to complete 5-6 slow labored short side steps at bedside without loss of balance, limited due to fatigue and generalized weakness   Stairs             Wheelchair Mobility    Modified Rankin (Stroke Patients Only)       Balance Overall balance assessment: Needs assistance Sitting-balance support: Feet supported;No upper extremity supported Sitting balance-Leahy Scale: Good Sitting balance - Comments: seated at EOB   Standing balance support: During functional activity;Bilateral upper extremity supported Standing balance-Leahy Scale: Fair Standing balance comment: using RW                            Cognition Arousal/Alertness: Awake/alert Behavior During Therapy: WFL for tasks assessed/performed Overall Cognitive Status: Within Functional Limits for tasks assessed                                        Exercises General Exercises - Lower Extremity Ankle Circles/Pumps: Seated;AROM;Strengthening;Both;15 reps Short Arc Quad: Seated;AROM;Strengthening;Both;15 reps Hip Flexion/Marching: Seated;AROM;Strengthening;Both;10 reps  General Comments        Pertinent Vitals/Pain Pain Assessment: Faces Faces Pain Scale: Hurts a little bit Pain Location: abdomen at surgical site Pain Descriptors / Indicators: Grimacing;Sore Pain Intervention(s): Limited activity within patient's tolerance;Monitored during session;Premedicated  before session;Repositioned    Home Living                      Prior Function            PT Goals (current goals can now be found in the care plan section) Acute Rehab PT Goals Patient Stated Goal: home with family to assist PT Goal Formulation: With patient Time For Goal Achievement: 04/09/21 Potential to Achieve Goals: Good Progress towards PT goals: Progressing toward goals    Frequency    Min 3X/week      PT Plan Current plan remains appropriate    Co-evaluation              AM-PAC PT "6 Clicks" Mobility   Outcome Measure  Help needed turning from your back to your side while in a flat bed without using bedrails?: A Little Help needed moving from lying on your back to sitting on the side of a flat bed without using bedrails?: A Lot Help needed moving to and from a bed to a chair (including a wheelchair)?: A Lot Help needed standing up from a chair using your arms (e.g., wheelchair or bedside chair)?: A Lot Help needed to walk in hospital room?: A Lot Help needed climbing 3-5 steps with a railing? : Total 6 Click Score: 12    End of Session   Activity Tolerance: Patient tolerated treatment well;Patient limited by fatigue Patient left: in chair;with call bell/phone within reach Nurse Communication: Mobility status PT Visit Diagnosis: Unsteadiness on feet (R26.81);Other abnormalities of gait and mobility (R26.89);Muscle weakness (generalized) (M62.81)     Time: 6767-2094 PT Time Calculation (min) (ACUTE ONLY): 29 min  Charges:  $Therapeutic Exercise: 8-22 mins $Therapeutic Activity: 8-22 mins                     12:11 PM, 04/04/21 Lonell Grandchild, MPT Physical Therapist with [facility] [phone] office [phone] mobile phone

## 2021-04-04 NOTE — Progress Notes (Signed)
[facility]  Progressing towards home. Pain better. Umbilical site c/d/i with dermabond, no erythema or drainage. Working with PT better today. Soft diet. Wants raisins to have  BM. Restart plavix today. Likely home in the next few days.  Curlene Labrum, MD [facility] [address] [place], [place] [address] [phone] (office)

## 2021-04-04 NOTE — Progress Notes (Signed)
Alert and oriented. Surgical sites to abdomen intact. Abd binder in place.  Eating well and passing gas.

## 2021-04-04 NOTE — Progress Notes (Signed)
PROGRESS NOTE  Stacey [name] [id number] DOB: 1946/03/03 DOA: 03/24/2021 PCP: Sela Hilding   Brief History:  75 year old female with a history of coronary artery disease status post DES to the RCA December [id number], systolic & diastolic CHF, COPD, hypertension, metastatic breast cancer, hyperlipidemia, PSVT, chronic pain syndrome status post intrathecal pump placement presenting with abdominal pain that began on 03/24/2021 that was severe rating 10/10 diffuse in nature.  The patient has subjective fevers and chills.  She had nausea without emesis.  She denies any diarrhea.  She denies any headache, cough, hemoptysis, chest pain, worsening shortness of breath, hematochezia, melena, dysuria, hematuria. In the emergency department, the patient was afebrile hemodynamically stable with oxygen saturation 100% on 3 L.  CT of the abdomen and pelvis showed gallbladder wall thickening and edema with infiltration into the pericholecystic fat.  There was extrahepatic ductal dilatation with suggestion of stones in the distal CBD and moderate intrahepatic biliary ductal dilatation.  There was infiltration and thickening around the proximal duodenum probably referred from the gallbladder.  The patient was started on Zosyn.  GI and general surgery were consulted to assist.  Assessment/Plan:  Acute respiratory failure with hypercarbia - resolved after BiPAP therapy - likely side effect of anesthesia - CXR no acute findings - careful with opioids - continue supportive measures   Acute cholecystitis/  s/p Lap Chole  -General surgery consult appreciated-->lap chole 4/29 with Dr. Constance Haw -Completed IV Zosyn -tolerating soft diet -no BM yet, continue colace BID  S/p incarcerated umbilical hernia repair on 04/03/2021 by primary closure- -postop care per Dr. Constance Haw -No postop concerns at this time -Pain control improving  Transaminasemia with hyperbilirubinemia -due to  cholecystitis/choledocholithiasis -ERCP on 4/28./22  was successful with 3 pigmented stones removed  Chronic systolic and diastolic CHF -Appears clinically euvolemic -04/18/2020 echo EF 35-40%, G1DD, mild to moderate AAS, mild MR -Continue metoprolol succinate -03/25/21 Echo EF 50-55%, G1DD, no WMA, trivial MR  Chronic respiratory failure with nocturnal hypoxia -Patient is normally on 1 L nasal cannula at nighttime, now on room air -Monitor clinically -Incentive spirometry  Coronary artery disease -No chest pain presently -Per general surgeon okay to restart Plavix  -Continue metoprolol succinate  Generalized weakness and deconditioning -Patient and family not interested in SNF hoping to go home with home health PT  Chronic pain syndrome -Patient has intrathecal morphine pump infusing 5.1 mg per 24-hour. -resume home dose oxycodone and Lyrica  Metastatic breast cancer to the bone -02/19/2021 bone scan--negative for bone metastasis -Outpatient follow-up with Dr. Delton Coombes  COPD -Stable  Chronic L1/L2 compression fracture -Continue home opioid regimen  Essential hypertension -Continue metoprolol succinate   Hyperlipidemia -Holding Lipitor secondary to elevated LFTs initially  Hypokalemia -repleted  Hypomagnesemia - repleted with IV treatment  Macrocytic anemia -B12 and folate within normal limits -baseline Hgb 11-12  Morbid Obesity -BMI 39.09 -lifestyle modification  Status is: Inpatient  Appropriate for inpatient due to severity of illness  Dispo: The patient is from: Home  Anticipated d/c is to: Home with [facility] services  Patient currently is not medically stable to d/c.  Restart Plavix per general surgeon              Difficult to place patient No  Family Communication:  spoke with son [name] at bedside and daughter by phone  Consultants:  GI, general surgery  Code Status:  FULL   DVT Prophylaxis:   SCDs   Procedures: As Listed in  Progress Note Above  Antibiotics: Zosyn 4/25>>5/1  Subjective: Pain control improving, no emesis,  -No BM -Had episode of nausea but no emesis -Ambulated with physical therapist  Objective: Vitals:   04/03/21 1855 04/03/21 2050 04/03/21 2345 04/04/21 0649  BP: (!) 148/63 (!) 120/51 (!) 137/56 (!) 142/53  Pulse: 82 78 (!) 44 65  Resp: 20 20 20 18   Temp: 97.9 F (36.6 C) 98.9 F (37.2 C) 97.8 F (36.6 C) (!) 97.5 F (36.4 C)  TempSrc: Oral Oral    SpO2: 100% 98% (!) 87% 90%  Weight:      Height:        Intake/Output Summary (Last 24 hours) at 04/04/2021 1924 Last data filed at 04/04/2021 1730 Gross per 24 hour  Intake 480 ml  Output 800 ml  Net -320 ml   Weight change:  Exam:   Physical Exam  Gen:- Awake Alert, no acute distress HEENT:- [place].AT, No sclera icterus Neck-Supple Neck,No JVD,.  Lungs-  CTAB, fair air movement CV- S1, S2 normal, irregular Abd-  +ve B.Sounds, Abd Soft, appropriate postop tenderness, postop wounds intact Extremity/Skin:- No  edema,   good pulses Psych-affect is appropriate, oriented x3 Neuro-generalized weakness no new focal deficits, no tremors   Data Reviewed: I have personally reviewed following labs and imaging studies Basic Metabolic Panel: Recent Labs  Lab 03/29/21 0432 03/30/21 0415 03/31/21 0350 04/01/21 0509 04/02/21 0410 04/03/21 0333 04/04/21 0516  NA 141 143 138 138 138 141 139  K 4.1 3.5 4.1 4.0 3.5 3.8 4.8  CL 103 103 100 101 100 102 100  CO2 27 31 30 31  32 33* 33*  GLUCOSE 151* 126* 102* 112* 115* 115* 91  BUN 15 15 15 12 11 11 8   CREATININE 0.85 0.87 0.64 0.59 0.59 0.66 0.65  CALCIUM 9.8 9.3 8.7* 8.7* 8.6* 8.8* 9.2  MG 1.7 1.6*  --  1.7  --   --   --    Liver Function Tests: Recent Labs  Lab 03/29/21 0432 03/30/21 0415 03/31/21 0350 04/01/21 0509 04/04/21 0516  AST 48* 117* 72* 40 18  ALT 65* 96* 71* 47* 19  ALKPHOS 83 76 68 64 60  BILITOT 1.0 1.3* 2.0* 0.9 0.9   PROT 6.1* 5.9* 5.6* 5.2* 5.2*  ALBUMIN 3.3* 3.1* 2.8* 2.4* 2.3*   No results for input(s): LIPASE, AMYLASE in the last 168 hours. No results for input(s): AMMONIA in the last 168 hours. Coagulation Profile: No results for input(s): INR, PROTIME in the last 168 hours. CBC: Recent Labs  Lab 03/29/21 0432 03/30/21 0415 03/31/21 0350 04/01/21 0509 04/02/21 0410 04/03/21 0333 04/04/21 0516  WBC 4.3 8.5 7.4 6.2 5.0 4.6 4.8  NEUTROABS 2.7 6.8  --   --  3.1  --   --   HGB 11.0* 11.1* 9.7* 8.8* 8.3* 8.2* 8.6*  HCT 33.3* 35.2* 30.0* 27.0* 25.8* 25.4* 27.0*  MCV 98.8 102.6* 101.4* 101.1* 101.2* 102.0* 101.9*  PLT 195 166 155 146* 162 189 214   Cardiac Enzymes: No results for input(s): CKTOTAL, CKMB, CKMBINDEX, TROPONINI in the last 168 hours. BNP: Invalid input(s): POCBNP CBG: No results for input(s): GLUCAP in the last 168 hours. HbA1C: No results for input(s): HGBA1C in the last 72 hours. Urine analysis:    Component Value Date/Time   COLORURINE AMBER (A) 03/24/2021 2201   APPEARANCEUR CLOUDY (A) 03/24/2021 2201   LABSPEC 1.038 (H) 03/24/2021 2201   PHURINE 5.0 03/24/2021 2201   GLUCOSEU 50 (A) 03/24/2021 2201  HGBUR NEGATIVE 03/24/2021 2201   Max 03/24/2021 2201   KETONESUR 5 (A) 03/24/2021 2201   PROTEINUR 100 (A) 03/24/2021 2201   NITRITE NEGATIVE 03/24/2021 2201   LEUKOCYTESUR NEGATIVE 03/24/2021 2201   Sepsis Labs:  Recent Results (from the past 240 hour(s))  Surgical pcr screen     Status: Abnormal   Collection Time: 03/29/21  8:41 AM   Specimen: Nasal Mucosa; Nasal Swab  Result Value Ref Range Status   MRSA, PCR POSITIVE (A) NEGATIVE Final    Comment: RESULT CALLED TO, READ BACK BY AND VERIFIED WITH: TORI RN @ 1118 ON [id number] BY HENDERSON L.    Staphylococcus aureus POSITIVE (A) NEGATIVE Final    Comment: RESULT CALLED TO, READ BACK BY AND VERIFIED WITH: TORI RN @ 2025 ON [id number] BY HENDERSON L. (NOTE) The Xpert SA Assay (FDA approved for  NASAL specimens in patients 2 years of age and older), is one component of a comprehensive surveillance program. It is not intended to diagnose infection nor to guide or monitor treatment. Performed at [facility], [address], [place], [place] [address]      Scheduled Meds: . vitamin C  500 mg Oral Daily  . [START ON 04/05/2021] aspirin EC  81 mg Oral Daily  . atorvastatin  80 mg Oral QPM  . Chlorhexidine Gluconate Cloth  6 each Topical Daily  . clopidogrel  75 mg Oral Daily  . diclofenac Sodium  2 g Topical QID  . docusate sodium  100 mg Oral BID  . DULoxetine  90 mg Oral Daily  . metoprolol succinate  150 mg Oral Daily  . multivitamin with minerals  1 tablet Oral Daily  . polyvinyl alcohol  1 drop Both Eyes TID  . pregabalin  50 mg Oral TID   Continuous Infusions:   Procedures/Studies: CT ABDOMEN PELVIS W CONTRAST  Result Date: 04/02/2021 CLINICAL DATA:  Recent laparoscopic cholecystectomy, abdominal pain, umbilical firmness EXAM: CT ABDOMEN AND PELVIS WITH CONTRAST TECHNIQUE: Multidetector CT imaging of the abdomen and pelvis was performed using the standard protocol following bolus administration of intravenous contrast. CONTRAST:  43mL OMNIPAQUE IOHEXOL 300 MG/ML  SOLN COMPARISON:  03/25/2021 FINDINGS: Lower chest: Hypoventilatory changes are seen at the lung bases. Hepatobiliary: Postsurgical changes are seen from cholecystectomy. Gas and fluid within the gallbladder fossa measuring up to 6.6 x 5.8 cm consistent with recent surgery. No peripheral enhancement to suggest abscess. Diffuse pneumobilia consistent with recent bile duct manipulation. The liver is unremarkable. Pancreas: Unremarkable. No pancreatic ductal dilatation or surrounding inflammatory changes. Spleen: Normal in size without focal abnormality. Adrenals/Urinary Tract: Suspected renal cell carcinoma upper pole left kidney measuring 2 cm on image 23. Right kidney is unremarkable. No urinary tract calculi or  obstructive uropathy. The adrenals and bladder are normal. Stomach/Bowel: No bowel obstruction or ileus. Scattered distal colonic diverticulosis without diverticulitis. No bowel wall thickening or inflammatory change. Vascular/Lymphatic: Aortic atherosclerosis. No enlarged abdominal or pelvic lymph nodes. Reproductive: Status post hysterectomy. No adnexal masses. Other: A fat containing umbilical hernia is identified. Since the prior exam, there is significant fat stranding and inflammatory change within the herniated fat, concerning for incarceration. No bowel herniation. There is no free fluid or free intraperitoneal gas. Musculoskeletal: Stable subacute L1 compression fracture and retropulsion with greater than 75% narrowing of the central canal. Stable sclerosis within the L2 vertebral body and left inferior pubic ramus concerning for metastatic disease. Reconstructed images demonstrate no additional findings. IMPRESSION: 1. Incarcerated fat containing umbilical hernia. No  bowel herniation. 2. Postoperative changes from cholecystectomy, with likely postoperative hematoma or seroma within the gallbladder fossa. No evidence of abscess at this time. 3. 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 4. Stable subacute L1 compression deformity with retropulsion and significant central canal stenosis unchanged. 5. Stable sclerotic lesions within the L2 vertebral body and left inferior pubic ramus, consistent with bony metastatic disease in a patient with a history of breast cancer. 6.  Aortic Atherosclerosis (ICD10-I70.0). These results will be called to the ordering clinician or representative by the Radiologist Assistant, and communication documented in the PACS or [employer]. Electronically Signed   By: Randa Ngo M.D.   On: 04/02/2021 22:25   CT ABDOMEN PELVIS W CONTRAST  Result Date: 03/25/2021 CLINICAL DATA:  Acute abdominal pain. History of MI, hypertension, congestive heart failure,  breast cancer. EXAM: CT ABDOMEN AND PELVIS WITH CONTRAST TECHNIQUE: Multidetector CT imaging of the abdomen and pelvis was performed using the standard protocol following bolus administration of intravenous contrast. CONTRAST:  18mL OMNIPAQUE IOHEXOL 300 MG/ML  SOLN COMPARISON:  02/19/2021 FINDINGS: Lower chest: Lung bases are clear. Hepatobiliary: Gallbladder wall thickening and edema with infiltration into the pericholecystic fat. Small stones layering in the gallbladder. Extrahepatic bile duct dilatation with suggestion of stones in the distal common bile duct. Moderate intrahepatic bile duct dilatation. No focal liver lesions. Small amount of perihepatic ascites. Pancreas: Unremarkable. No pancreatic ductal dilatation or surrounding inflammatory changes. Spleen: Normal in size without focal abnormality. Adrenals/Urinary Tract: No adrenal gland nodules. Complex hypoenhancing lesion demonstrated in the left kidney measuring 1.9 cm diameter. This may represent a renal cell carcinoma. Similar appearance to previous study. No hydronephrosis or hydroureter. Bladder wall is thickened, probably due to under distention although cystitis could also have this appearance. Stomach/Bowel: Stomach, small bowel, and colon are not abnormally distended. Scattered colonic diverticula without evidence of diverticulitis. Infiltration and wall thickening around the proximal duodenum probably is referred inflammation from the gallbladder. Duodenitis could also be present. Appendix is not identified. Vascular/Lymphatic: Aortic atherosclerosis. No enlarged abdominal or pelvic lymph nodes. Reproductive: Uterus is surgically absent. No abnormal adnexal masses. Other: No free air in the abdomen. Moderate-sized periumbilical hernia containing fat. Musculoskeletal: Degenerative changes in the spine. Lumbar scoliosis convex towards the left. Sclerotic bone lesions consistent with metastatic disease and vertebral compression of L2.  IMPRESSION: 1. Cholelithiasis with changes of acute cholecystitis. Bile duct dilatation with probable stones in the distal common bile duct. 2. Infiltration and wall thickening around the proximal duodenum probably is referred inflammation from the gallbladder. Duodenitis could also have this appearance. 3. Complex hypoenhancing lesion in the left kidney may represent a renal cell carcinoma. No change since prior study. 4. Sclerotic bone lesions consistent with metastatic disease. Compression of L2. 5. Moderate-sized periumbilical hernia containing fat. 6. Aortic atherosclerosis. Aortic Atherosclerosis (ICD10-I70.0). Electronically Signed   By: Lucienne Capers M.D.   On: 03/25/2021 00:50   DG CHEST PORT 1 VIEW  Result Date: 03/30/2021 CLINICAL DATA:  Acute on chronic respiratory failure.  Hypercapnia. EXAM: PORTABLE CHEST 1 VIEW COMPARISON:  November 15, 2019 FINDINGS: The heart size and mediastinal contours are within normal limits. Both lungs are clear. The visualized skeletal structures are unremarkable. IMPRESSION: No active disease. Electronically Signed   By: Dorise Bullion III M.D   On: 03/30/2021 08:52   DG ERCP BILIARY & PANCREATIC DUCTS  Result Date: 03/28/2021 CLINICAL DATA:  Choledocholithiasis EXAM: ERCP TECHNIQUE: Multiple spot images obtained with the fluoroscopic device and  submitted for interpretation post-procedure. COMPARISON:  CT abdomen pelvis-03/25/2021 FLUOROSCOPY TIME:  2 minutes, 2 seconds (39.4 mGy) FINDINGS: Multiple spot fluoroscopic images the right upper abdominal quadrant during ERCP are provided for review. Initial image demonstrates an ERCP probe overlying the right upper abdominal quadrant. Subsequent images demonstrate selective cannulation opacification of the common duct which appears moderately dilated. There are several transient nonocclusive filling defects within the CBD which could represent air bubbles versus nonocclusive choledocholithiasis. Subsequent images  demonstrate insufflation of a balloon within the central aspect of the CBD with subsequent biliary sweeping and presumed stone extraction and sphincterotomy There is minimal opacification of the central aspect of the intrahepatic biliary tree which appears mildly dilated. There is faint opacification of the gallbladder lumen. There is no definitive opacification of the pancreatic duct. IMPRESSION: ERCP with biliary sweeping and presumed stone extraction and sphincterotomy as above. These images were submitted for radiologic interpretation only. Please see the procedural report for the amount of contrast and the fluoroscopy time utilized. Electronically Signed   By: Sandi Mariscal M.D.   On: 03/28/2021 14:23   ECHOCARDIOGRAM COMPLETE  Result Date: 03/25/2021    ECHOCARDIOGRAM REPORT   Patient Name:   CHERUB WOHLERT Date of Exam: 03/25/2021 Medical Rec #:  [id number]         Height:       60.0 in Accession #:    [id number]        Weight:       200.2 lb Date of Birth:  1946-02-21          BSA:          1.867 m Patient Age:    52 years          BP:           141/72 mmHg Patient Gender: F                 HR:           71 bpm. Exam Location:  [facility] Procedure: 2D Echo, Cardiac Doppler and Color Doppler Indications:    CHF  History:        Patient has prior history of Echocardiogram examinations, most                 recent 04/18/2020. CHF, CAD, COPD, Aortic Valve Disease and                 Aortic stenosis; Risk Factors:Hypertension, Dyslipidemia and                 morbid obestiy. Metastatic breast cancer.  Sonographer:    Dustin Flock RDCS Referring Phys: [phone] DAVID TAT IMPRESSIONS  1. Left ventricular ejection fraction, by estimation, is 50 to 55%. The left ventricle has low normal function. The left ventricle has no regional wall motion abnormalities. Left ventricular diastolic parameters are consistent with Grade I diastolic dysfunction (impaired relaxation).  2. Right ventricular systolic function is  normal. The right ventricular size is normal. There is mildly elevated pulmonary artery systolic pressure. The estimated right ventricular systolic pressure is 0000000 mmHg.  3. Left atrial size was upper normal.  4. The mitral valve is grossly normal. Trivial mitral valve regurgitation.  5. The aortic valve is tricuspid with some fusion of left and noncoronary cusps. There is moderate calcification of the aortic valve. Aortic valve regurgitation is not visualized. Moderate aortic valve stenosis. Aortic valve mean gradient measures 10.5 mmHg. Dimentionless index 0.31.  6. The  inferior vena cava is normal in size with greater than 50% respiratory variability, suggesting right atrial pressure of 3 mmHg. FINDINGS  Left Ventricle: Left ventricular ejection fraction, by estimation, is 50 to 55%. The left ventricle has low normal function. The left ventricle has no regional wall motion abnormalities. The left ventricular internal cavity size was normal in size. There is borderline left ventricular hypertrophy. Abnormal (paradoxical) septal motion, consistent with left bundle branch block. Left ventricular diastolic parameters are consistent with Grade I diastolic dysfunction (impaired relaxation). Right Ventricle: The right ventricular size is normal. No increase in right ventricular wall thickness. Right ventricular systolic function is normal. There is mildly elevated pulmonary artery systolic pressure. The tricuspid regurgitant velocity is 3.24  m/s, and with an assumed right atrial pressure of 3 mmHg, the estimated right ventricular systolic pressure is 90.2 mmHg. Left Atrium: Left atrial size was upper normal. Right Atrium: Right atrial size was normal in size. Pericardium: There is no evidence of pericardial effusion. Mitral Valve: The mitral valve is grossly normal. Mild mitral annular calcification. Trivial mitral valve regurgitation. Tricuspid Valve: The tricuspid valve is grossly normal. Tricuspid valve  regurgitation is mild. Aortic Valve: The aortic valve is tricuspid. There is moderate calcification of the aortic valve. There is moderate aortic valve annular calcification. Aortic valve regurgitation is not visualized. Moderate aortic stenosis is present. Aortic valve mean gradient measures 10.5 mmHg. Aortic valve peak gradient measures 18.1 mmHg. Aortic valve area, by VTI measures 1.09 cm. Pulmonic Valve: The pulmonic valve was grossly normal. Pulmonic valve regurgitation is trivial. Aorta: The aortic root is normal in size and structure. Venous: The inferior vena cava is normal in size with greater than 50% respiratory variability, suggesting right atrial pressure of 3 mmHg. IAS/Shunts: No atrial level shunt detected by color flow Doppler.  LEFT VENTRICLE PLAX 2D LVIDd:         4.35 cm  Diastology LVIDs:         3.18 cm  LV e' medial:    5.44 cm/s LV PW:         1.05 cm  LV E/e' medial:  11.7 LV IVS:        1.08 cm  LV e' lateral:   7.40 cm/s LVOT diam:     2.10 cm  LV E/e' lateral: 8.6 LV SV:         52 LV SV Index:   28 LVOT Area:     3.46 cm  RIGHT VENTRICLE RV Basal diam:  3.00 cm RV S prime:     7.07 cm/s TAPSE (M-mode): 2.1 cm LEFT ATRIUM             Index       RIGHT ATRIUM           Index LA diam:        3.80 cm 2.04 cm/m  RA Area:     11.70 cm LA Vol (A2C):   37.5 ml 20.08 ml/m RA Volume:   32.00 ml  17.14 ml/m LA Vol (A4C):   60.0 ml 32.13 ml/m LA Biplane Vol: 50.8 ml 27.20 ml/m  AORTIC VALVE AV Area (Vmax):    1.10 cm AV Area (Vmean):   1.16 cm AV Area (VTI):     1.09 cm AV Vmax:           213.00 cm/s AV Vmean:          149.000 cm/s AV VTI:  0.477 m AV Peak Grad:      18.1 mmHg AV Mean Grad:      10.5 mmHg LVOT Vmax:         67.90 cm/s LVOT Vmean:        50.000 cm/s LVOT VTI:          0.150 m LVOT/AV VTI ratio: 0.31  AORTA Ao Root diam: 2.70 cm MITRAL VALVE               TRICUSPID VALVE MV Area (PHT): 2.68 cm    TR Peak grad:   42.0 mmHg MV Decel Time: 283 msec    TR Vmax:         324.00 cm/s MV E velocity: 63.80 cm/s MV A velocity: 89.50 cm/s  SHUNTS MV E/A ratio:  0.71        Systemic VTI:  0.15 m                            Systemic Diam: 2.10 cm Rozann Lesches MD Electronically signed by Rozann Lesches MD Signature Date/Time: 03/25/2021/2:24:21 PM    Final    Roxan Hockey, MD  [facility]  If 7PM-7AM, please contact night-coverage [email or web address] Password [email or web address] 04/04/2021, 7:24 PM   LOS: 10 days

## 2021-04-05 ENCOUNTER — Ambulatory Visit (HOSPITAL_COMMUNITY): Payer: Medicare Other

## 2021-04-05 DIAGNOSIS — K8001 Calculus of gallbladder with acute cholecystitis with obstruction: Secondary | ICD-10-CM | POA: Diagnosis not present

## 2021-04-05 MED ORDER — ONDANSETRON HCL 4 MG PO TABS: 4.0000 mg | ORAL_TABLET | Freq: Every day | ORAL | 0 refills | Status: DC | PRN

## 2021-04-05 MED ORDER — ACETAMINOPHEN 325 MG PO TABS: 650.0000 mg | ORAL_TABLET | Freq: Four times a day (QID) | ORAL | 0 refills | Status: DC | PRN

## 2021-04-05 MED ORDER — ASPIRIN EC 81 MG PO TBEC: 81.0000 mg | DELAYED_RELEASE_TABLET | Freq: Every day | ORAL | 11 refills | Status: DC

## 2021-04-05 NOTE — Discharge Instructions (Signed)
-1)You are taking Aspirin and Plavix which are Blood Thinners so Please Avoid ibuprofen/Advil/Aleve/Motrin/Goody Powders/Naproxen/BC powders/Meloxicam/Diclofenac/Indomethacin and other Nonsteroidal anti-inflammatory medications as these will make you more likely to bleed and can cause stomach ulcers, can also cause Kidney problems.   2)Increased Mobility strongly advised----avoid staying in bed for extended period of time during the day  3)follow up with Dr Constance Haw (Gen Surgeon) as advised  4)follow up with your Pain Specialist for further adjustments of your pain regimen  5)Physical Therapy will come to your House to Help you get stronger     Discharge Laparoscopic Surgery Instructions:  Common Complaints: Right shoulder pain is common after laparoscopic surgery. This is secondary to the gas used in the surgery being trapped under the diaphragm.  Walk to help your body absorb the gas. This will improve in a few days. Pain at the port sites are common, especially the larger port sites. This will improve with time.  Some nausea is common and poor appetite. The main goal is to stay hydrated the first few days after surgery.   Diet/ Activity: Diet as tolerated. You may not have an appetite, but it is important to stay hydrated. Drink 64 ounces of water a day. Your appetite will return with time.  Shower per your regular routine daily.  Do not take hot showers. Take warm showers that are less than 10 minutes. Rest and listen to your body, but do not remain in bed all day.  Walk everyday for at least 15-20 minutes. Deep cough and move around every 1-2 hours in the first few days after surgery.  Do not lift > 10 lbs, perform excessive bending, pushing, pulling, squatting for 1-2 weeks after surgery.  Do not pick at the dermabond glue on your incision sites.  This glue film will remain in place for 1-2 weeks and will start to peel off.  Do not place lotions or balms on your incision unless  instructed to specifically by Dr. Constance Haw.   Pain Expectations and Narcotics: -After surgery you will have pain associated with your incisions and this is normal. The pain is muscular and nerve pain, and will get better with time. -You are encouraged and expected to take non narcotic medications like tylenol and ibuprofen (when able) to treat pain as multiple modalities can aid with pain treatment. -Narcotics are only used when pain is severe or there is breakthrough pain. -You are not expected to have a pain score of 0 after surgery, as we cannot prevent pain. A pain score of 3-4 that allows you to be functional, move, walk, and tolerate some activity is the goal. The pain will continue to improve over the days after surgery and is dependent on your surgery. -Due to [place] law, we are only able to give a certain amount of pain medication to treat post operative pain, and we only give additional narcotics on a patient by patient basis.  -For most laparoscopic surgery, studies have shown that the majority of patients only need 10-15 narcotic pills, and for open surgeries most patients only need 15-20.   -Having appropriate expectations of pain and knowledge of pain management with non narcotics is important as we do not want anyone to become addicted to narcotic pain medication.  -Using ice packs in the first 48 hours and heating pads after 48 hours, wearing an abdominal binder (when recommended), and using over the counter medications are all ways to help with pain management.   -Simple acts like meditation and mindfulness  practices after surgery can also help with pain control and research has proven the benefit of these practices.  Medication: Take tylenol and ibuprofen as needed for pain control, alternating every 4-6 hours.  Example:  Tylenol 1000mg  @ 6am, 12noon, 6pm, 68midnight (Do not exceed 4000mg  of tylenol a day). Ibuprofen 800mg  @ 9am, 3pm, 9pm, 3am (Do not exceed 3600mg  of ibuprofen a day).   Take a narcotic as need to for break through pain.  Take Colace for constipation related to narcotic pain medication. If you do not have a bowel movement in 2 days, take Miralax over the counter.  Drink plenty of water to also prevent constipation.   Contact Information: If you have questions or concerns, please call our office, [phone], Monday- Thursday 8AM-5PM and Friday 8AM-12Noon.  If it is after hours or on the weekend, please call [facility] Main Number, [phone], [phone], and ask to speak to the surgeon on call for Dr. Constance Haw at [facility].    Minimally Invasive Cholecystectomy, Care After This sheet gives you information about how to care for yourself after your procedure. Your doctor may also give you more specific instructions. If you have problems or questions, contact your doctor. What can I expect after the procedure? After the procedure, it is common:  To have pain at the areas of surgery. You will be given medicines for pain.  To vomit or feel like you may vomit.  To feel fullness in the belly (bloating) or to have pain in the shoulder. This comes from the gas that was used during the surgery. Follow these instructions at home: Medicines  Take over-the-counter and prescription medicines only as told by your doctor.  If you were prescribed an antibiotic medicine, take it as told by your doctor. Do not stop using the antibiotic even if you start to feel better.  Ask your doctor if the medicine prescribed to you: ? Requires you to avoid driving or using machinery. ? Can cause trouble pooping (constipation). You may need to take these actions to prevent or treat trouble pooping:  Drink enough fluid to keep your pee (urine) pale yellow.  Take over-the-counter or prescription medicines.  Eat foods that are high in fiber. These include beans, whole grains, and fresh fruits and vegetables.  Limit foods that are high in fat and sugar. These include fried or  sweet foods. Incision care  Do not take baths, swim, or use a hot tub until your doctor approves. You may shower.  Check your surgery area every day for signs of infection. Check for: ? More redness, swelling, or pain. ? Fluid or blood. ? Warmth. ? Pus or a bad smell.   Activity  Rest as told by your doctor.  Do not sit for a long time without moving. Get up to take short walks every 1-2 hours. This is important. Ask for help if you feel weak or unsteady.  Do not lift anything that is heavier than 10 lb (4.5 kg), or the limit that you are told, until your doctor says that it is safe.  Do not play contact sports until your doctor says it is okay.  Do not return to work or school until your doctor says it is okay.  Return to your normal activities as told by your doctor. Ask your doctor what activities are safe for you. General instructions  If you were given a medicine to help you relax (sedative) during your procedure, it can affect you for many hours. Do not  drive or use machinery until your doctor says that it is safe.  Keep all follow-up visits as told by your doctor. This is important. Contact a doctor if:  You get a rash.  You have more redness, swelling, or pain around your cuts from surgery.  You have fluid or blood coming from your cuts from surgery.  Your cuts from surgery feel warm to the touch.  You have pus or a bad smell coming from your cuts from surgery.  You have a fever.  One or more of your cuts from surgery breaks open. Get help right away if:  You have trouble breathing.  You have chest pain.  You have pain that is getting worse in your shoulders.  You faint or feel dizzy when you stand.  You have very bad pain in your belly (abdomen).  You feel like you may vomit or you vomit, and this lasts for more than one day.  You have leg pain. Summary  After your surgery, it is common to have pain at the areas of surgery. You may also have  vomiting or fullness in the belly.  Follow your doctor's instructions about medicine, activity restrictions, and caring for your surgery areas. Do not do activities that require a lot of effort.  Contact a doctor if you have a fever or other signs of infection, such as more redness, swelling, or pain around the cuts from surgery.  Get help right away if you have chest pain, increasing pain in the shoulders, or trouble breathing. This information is not intended to replace advice given to you by your health care provider. Make sure you discuss any questions you have with your health care provider. Document Revised: 11/01/2019 Document Reviewed: 11/01/2019 Elsevier Patient Education  2021 [employer].

## 2021-04-05 NOTE — TOC Transition Note (Signed)
Transition of Care [facility]) - CM/SW Discharge Note   Patient Details  Name: Stacey [name] MRN: [id number] Date of Birth: 07/11/1946  Transition of Care [facility]) CM/SW Contact:  Natasha Bence, LCSW Phone Number: 04/05/2021, 1:34 PM   Clinical Narrative:    CSW notified of patient's readiness for discharge. CSW notified Georgina Snell with [facility]. Georgina Snell agreeable to take patient. TOC signing off.   Final next level of care: [place] Barriers to Discharge: Barriers Resolved   Patient Goals and CMS Choice Patient states their goals for this hospitalization and ongoing recovery are:: Return home with [facility] CMS Medicare.gov Compare Post Acute Care list provided to:: Patient Choice offered to / list presented to : Patient  Discharge Placement                    Patient and family notified of of transfer: 04/05/21  Discharge Plan and Services In-house Referral: Clinical Social Work   Post Acute Care Choice: Home Health          DME Arranged: N/A DME Agency: NA Date DME Agency Contacted: 03/25/21 Time DME Agency Contacted: 1062 Representative spoke with at DME Agency: Freda Munro HH Arranged: PT [place]: [place] Date Morning Sun: 04/05/21 Time [place]: 1310 Representative spoke with at [place]: [place] (Gisela) Interventions     Readmission Risk Interventions Readmission Risk Prevention Plan 07/03/2020 04/26/2020  Transportation Screening Complete Complete  HRI or [place] - Complete  Social Work Consult for [place] Planning/Counseling - Complete  Palliative Care Screening - Complete  Medication Review Press photographer) Complete Complete  PCP or Specialist appointment within 3-5 days of discharge Complete -  [place] or [place] Complete -  SW Recovery Care/Counseling Consult Complete -  Palliative Care Screening Complete -  [place] Not Applicable -  Some recent  data might be hidden

## 2021-04-05 NOTE — Progress Notes (Signed)
Nsg Discharge Note  Admit Date:  03/24/2021 Discharge date: 04/05/2021   [name] to be D/C'd Home per MD order.  AVS completed.  Copy for chart, and copy for patient signed, and dated. Patient/caregiver able to verbalize understanding. Jerene Pitch, RN, Reviewed d/c paperwork with patient. Answered all questions and wheeled stable patient and belongings to main entrance where she was picked up by her son to d/c to home.  Discharge Medication: Allergies as of 04/05/2021      Reactions   Brilinta [ticagrelor] Diarrhea, Nausea Only   Nausea and severe diarrhea- general weakness. Patient says does not want to take again   Budesonide Palpitations   Albuterol Other (See Comments)   Heart racing      Medication List    STOP taking these medications   acetaminophen 650 MG CR tablet Commonly known as: TYLENOL Replaced by: acetaminophen 325 MG tablet     TAKE these medications   acetaminophen 325 MG tablet Commonly known as: TYLENOL Take 2 tablets (650 mg total) by mouth every 6 (six) hours as needed for mild pain, fever or headache. Replaces: acetaminophen 650 MG CR tablet   aspirin EC 81 MG tablet Take 1 tablet (81 mg total) by mouth daily with breakfast. Swallow whole. Start taking on: Apr 07, 2021 What changed:   when to take this  These instructions start on Apr 07, 2021. If you are unsure what to do until then, ask your doctor or other care provider.   atorvastatin 80 MG tablet Commonly known as: LIPITOR Take 1 tablet (80 mg total) by mouth daily.   b complex vitamins tablet Take 1 tablet by mouth every morning.   carboxymethylcellulose 0.5 % Soln Commonly known as: REFRESH PLUS Place 1 drop into both eyes in the morning, at noon, in the evening, and at bedtime.   clopidogrel 75 MG tablet Commonly known as: PLAVIX Take 75 mg by mouth daily.   diclofenac Sodium 1 % Gel Commonly known as: VOLTAREN Apply 2 g topically 4 (four) times daily.   DULoxetine 30 MG  capsule Commonly known as: CYMBALTA Take 3 capsules (90 mg total) by mouth daily. What changed: how much to take   EQ VISION FORMULA 50+ PO Take 1 tablet by mouth every morning.   losartan 50 MG tablet Commonly known as: COZAAR Take 1 tablet by mouth daily.   metoprolol succinate 100 MG 24 hr tablet Commonly known as: TOPROL-XL TAKE 1 AND 1/2 TABLETS DAILY What changed:   how much to take  how to take this  when to take this  additional instructions   MULTIVITAMIN ADULT PO Take 1 tablet by mouth daily.   nitroGLYCERIN 0.4 MG SL tablet Commonly known as: Nitrostat Place 1 tablet (0.4 mg total) under the tongue every 5 (five) minutes x 3 doses as needed for chest pain (if no relief after 3rd dose, proceed to the ED or call 911).   ondansetron 4 MG tablet Commonly known as: Zofran Take 1 tablet (4 mg total) by mouth daily as needed for nausea or vomiting.   oxyCODONE 5 MG immediate release tablet Commonly known as: Oxy IR/ROXICODONE Take 5 mg by mouth every 6 (six) hours as needed for moderate pain.   PAIN MANAGEMENT INTRATHECAL (IT) PUMP by Intrathecal route.   polyethylene glycol powder 17 GM/SCOOP powder Commonly known as: GLYCOLAX/MIRALAX Take 17 g by mouth daily as needed for moderate constipation.   pregabalin 50 MG capsule Commonly known as: LYRICA Take 50  mg by mouth 3 (three) times daily.   senna-docusate 8.6-50 MG tablet Commonly known as: Senokot-S 1 tablet once daily            Durable Medical Equipment  (From admission, onward)         Start     Ordered   03/25/21 1342  For home use only DME Walker rolling  Once       Question Answer Comment  Walker: With [place]   Patient needs a walker to treat with the following condition Gait instability      03/25/21 1341           Discharge Care Instructions  (From admission, onward)         Start     Ordered   04/05/21 0000  Discharge wound care:       Comments: Keep wounds  clean and dry   04/05/21 1308          Discharge Assessment: Vitals:   04/05/21 0401 04/05/21 1351  BP: 133/62 134/61  Pulse: 83 89  Resp: 19 20  Temp: 98.2 F (36.8 C) (!) 97.5 F (36.4 C)  SpO2: 96% 97%   Skin clean, dry and intact without evidence of skin break down, no evidence of skin tears noted. IV catheter discontinued intact. Site without signs and symptoms of complications - no redness or edema noted at insertion site, patient denies c/o pain - only slight tenderness at site.  Dressing with slight pressure applied.  D/c Instructions-Education: Discharge instructions given to patient/family with verbalized understanding. D/c education completed with patient/family including follow up instructions, medication list, d/c activities limitations if indicated, with other d/c instructions as indicated by MD - patient able to verbalize understanding, all questions fully answered. Patient instructed to return to ED, call 911, or call MD for any changes in condition.  Patient escorted via [place], and D/C home via private auto.  Santa Lighter, RN 04/05/2021 5:26 PM

## 2021-04-05 NOTE — Progress Notes (Signed)
[facility]  Overall doing better. Eating and having Bms.  Will plan to see 5/19 week and family aware. Updated office.   Curlene Labrum, MD [facility] [address] [place], [place] [address] [phone] (office)

## 2021-04-05 NOTE — Discharge Summary (Signed)
Stacey [name], is a 75 y.o. female  DOB 1946-03-29  MRN [id number].  Admission date:  03/24/2021  Admitting Physician  Bernadette Hoit, DO  Discharge Date:  04/05/2021   Primary MD  Sela Hilding  Recommendations for primary care physician for things to follow:   1)You are taking Aspirin and Plavix which are Blood Thinners so Please Avoid ibuprofen/Advil/Aleve/Motrin/Goody Powders/Naproxen/BC powders/Meloxicam/Diclofenac/Indomethacin and other Nonsteroidal anti-inflammatory medications as these will make you more likely to bleed and can cause stomach ulcers, can also cause Kidney problems.   2)Increased Mobility strongly advised----avoid staying in bed for extended period of time during the day  3)follow up with Dr Constance Haw (Gen Surgeon) as advised  4)follow up with your Pain Specialist for further adjustments of your pain regimen  5)Physical Therapy will come to your House to Help you get stronger   Admission Diagnosis  Cholecystitis [K81.9] Generalized abdominal pain [R10.84] Cholecystitis, acute with cholelithiasis [K80.00]   Discharge Diagnosis  Cholecystitis [K81.9] Generalized abdominal pain [R10.84] Cholecystitis, acute with cholelithiasis [K80.00]    Principal Problem:   Cholecystitis, acute with cholelithiasis Active Problems:   Hypertension   CAD (coronary artery disease)   Metastatic breast cancer (HCC)   Abnormal transaminases   Hyperlipidemia   Abdominal pain   Leukocytosis   Hypokalemia   Hyperglycemia   Chronic combined systolic and diastolic CHF (congestive heart failure) (HCC)   Obesity, Class II, BMI 35-39.9   Choledocholithiasis   Cholecystitis   Elevated bilirubin   Incarcerated umbilical hernia      Past Medical History:  Diagnosis Date  . CAD in native artery    a. DES to ramus 2005 with late stent thrombosis 2006 tx with PTCA. 12/18 PCI/DES x1 to mRCA,  EF 50-55%  . CHF (congestive heart failure) ([place])   . Chronic pain   . COPD (chronic obstructive pulmonary disease) ([place])   . Hyperlipidemia   . Hypertension   . Left bundle branch block   . Lymphedema   . Metastatic breast cancer ([place])    a. to bone.  . MI (myocardial infarction) ([place])   . Mild aortic stenosis 10/2017  . Morbid obesity ([place])   . PAF (paroxysmal atrial fibrillation) ([place])   . PSVT (paroxysmal supraventricular tachycardia) ([place])    a. per [facility] notes, seen on event monitor in 2014.  . Pulmonary nodules     Past Surgical History:  Procedure Laterality Date  . CHOLECYSTECTOMY N/A 03/29/2021   Procedure: LAPAROSCOPIC CHOLECYSTECTOMY;  Surgeon: [name], MD;  Location: [facility];  Service: General;  Laterality: N/A;  . CORONARY STENT INTERVENTION N/A 11/26/2017   Procedure: CORONARY STENT INTERVENTION;  Surgeon: Martinique, Peter M, MD;  Location: [place] [facility];  Service: Cardiovascular;  Laterality: N/A;  . CORONARY STENT PLACEMENT    . ERCP N/A 03/28/2021   Procedure: ENDOSCOPIC RETROGRADE CHOLANGIOPANCREATOGRAPHY (ERCP);  Surgeon: Rogene Houston, MD;  Location: [facility];  Service: Endoscopy;  Laterality: N/A;  . intrathecal pain pump    . LEFT HEART CATH  AND CORONARY ANGIOGRAPHY N/A 11/26/2017   Procedure: LEFT HEART CATH AND CORONARY ANGIOGRAPHY;  Surgeon: Martinique, Peter M, MD;  Location: [place] [facility];  Service: Cardiovascular;  Laterality: N/A;  . REMOVAL OF STONES  03/28/2021   Procedure: REMOVAL OF STONES;  Surgeon: Rogene Houston, MD;  Location: [facility];  Service: Endoscopy;;  . Joan Mayans  03/28/2021   Procedure: SPHINCTEROTOMY;  Surgeon: Rogene Houston, MD;  Location: [facility];  Service: Endoscopy;;  . UMBILICAL HERNIA REPAIR N/A 04/03/2021   Procedure: ADULT PRIMARY UMBILICAL HERNIA REPAIR;  Surgeon: [name], MD;  Location: [facility];  Service: General;  Laterality: N/A;  . VASCULAR SURGERY       HPI  from the history and physical done  on the day of admission:    Chief Complaint: Abdominal pain  HPI: [name] is a 75 y.o. female with medical history significant for metastatic breast cancer with a pain pump in place, hyperlipidemia, hypertension, CAD who presents to the emergency department due to abdominal pain which started yesterday (4/24) in the evening.  Pain was diffuse and it was rated to be greater than 10/10 on pain scale, pain was without aggravating/aggravating factor, it was associated with some nausea but no vomiting.  She denies fever, chills, and chest pain, shortness of breath.  ED Course:  In the emergency department, initial BP was 204/107, this eventually improved to 110/63, other vital signs were normal work-up in the ED showed leukocytosis, hypokalemia, hyperglycemia, elevated transaminases and T bili.  Influenza A, B and SARS coronavirus 2 was negative. CT abdomen and pelvis with contrast showed cholelithiasis with changes of acute cholecystitis. Bile duct dilatation with probable stones in the distal common bile duct. She was treated with IV ceftriaxone, IV morphine was given and patient was provided with IV hydration. General surgery (Dr. Arnoldo Morale) was consulted by ED physician and recommended GI consult to consider ERCP before surgical consideration.  Hospitalist was asked to admit patient for further evaluation and management.    Hospital Course:    Brief History: 75 year old female with a history ofcoronary artery disease status post DES to the RCA December [id number], systolic & diastolic CHF, COPD, hypertension, metastatic breast cancer, hyperlipidemia, PSVT, chronic pain syndrome status post intrathecal pump placementpresenting with abdominal pain that began on 03/24/2021 that was severe rating 10/10 diffuse in nature. The patient has subjective fevers and chills. She had nausea without emesis. She denies any diarrhea. She denies any headache, cough, hemoptysis, chest pain, worsening  shortness of breath, hematochezia, melena, dysuria, hematuria. In the emergency department, the patient was afebrile hemodynamically stable with oxygen saturation 100% on 3 L. CT of the abdomen and pelvis showed gallbladder wall thickening and edema with infiltration into the pericholecystic fat. There was extrahepatic ductal dilatation with suggestion of stones in the distal CBD and moderate intrahepatic biliary ductal dilatation. There was infiltration and thickening around the proximal duodenum probably referred from the gallbladder. The patient was started on Zosyn. GI and general surgery were consulted to assist.  Assessment/Plan:  Acute respiratory failure with hypercarbia - resolved after BiPAP therapy - likely side effect of anesthesia - CXR no acute findings - careful with opioids - continue supportive measures  Acute cholecystitis/  s/p Lap Chole  -General surgery consult appreciated-->lap chole 4/29 with Dr. Constance Haw -Completed IV Zosyn -tolerating soft diet Bowel function has returned  S/p incarcerated umbilical hernia repair on 04/03/2021 by primary closure- -postop care per Dr. Constance Haw -No postop concerns at this  time -Pain control appropriate  Transaminasemia with hyperbilirubinemia -due to cholecystitis/choledocholithiasis -ERCP on 4/28./22  was successful with 3 pigmented stones removed  Chronic systolic and diastolic CHF -Appears clinically euvolemic -04/18/2020 echo EF 35-40%,G1DD, mild to moderate AAS, mild MR -Continue metoprolol succinate -03/25/21 Echo EF 50-55%, G1DD, no WMA, trivial MR  Chronic respiratory failure with nocturnal hypoxia -Patient is normally on 1 L nasal cannula at nighttime,  -Incentive spirometry  Coronary artery disease -No chest pain presently -Per general surgeon okay to restart Plavix  -Continue metoprolol succinate  Generalized weakness and deconditioning -Patient and family not interested in SNF hoping to go home  with home health PT  Chronic pain syndrome -Patient has intrathecal morphine pump infusing 5.1 mg per 24-hour. -resume home dose oxycodone and Lyrica  Metastatic breast cancer to the bone -02/19/2021 bone scan--negative for bone metastasis -Outpatient follow-up with Dr. Delton Coombes  COPD -Stable  Chronic L1/L2 compression fracture -Continue home opioid regimen  Essential hypertension -Continue metoprolol succinate   Hyperlipidemia May restart Lipitor  Hypokalemia -repleted  Hypomagnesemia - repleted with IV treatment  Macrocytic anemia -B12 and folate within normal limits -baseline Hgb 11-12  Morbid Obesity -BMI 39.09 -lifestyle modification  Dispo: The patient is from:Home D/c Home with [facility] services  Family Communication:spoke with son and daughter at bedside  Consultants:GI, general surgery  Discharge Condition: Stable  Follow UP   Follow-up Information    [facility], [facility].   Specialty: Home Health Services Why: Will contact you to schedule home health services.  Contact information: [address] [place] [address] [phone]        [name], MD Follow up on 04/18/2021.   Specialty: General Surgery Why: in person appointment to check on hernia Contact information: [id number] [place] [facility] [address] [phone]              Diet and Activity recommendation:  As advised  Discharge Instructions    Discharge Instructions    Call MD for:  difficulty breathing, headache or visual disturbances   Complete by: As directed    Call MD for:  persistant dizziness or light-headedness   Complete by: As directed    Call MD for:  persistant nausea and vomiting   Complete by: As directed    Call MD for:  severe uncontrolled pain   Complete by: As directed    Call MD for:  temperature >100.4   Complete by: As directed    Diet - low sodium heart healthy   Complete by: As  directed    Discharge instructions   Complete by: As directed    1)You are taking Aspirin and Plavix which are Blood Thinners so Please Avoid ibuprofen/Advil/Aleve/Motrin/Goody Powders/Naproxen/BC powders/Meloxicam/Diclofenac/Indomethacin and other Nonsteroidal anti-inflammatory medications as these will make you more likely to bleed and can cause stomach ulcers, can also cause Kidney problems.   2)Increased Mobility strongly advised----avoid staying in bed for extended period of time during the day  3)follow up with Dr Constance Haw (Gen Surgeon) as advised  4)follow up with your Pain Specialist for further adjustments of your pain regimen  5)Physical Therapy will come to your House to Help you get stronger   Discharge wound care:   Complete by: As directed    Keep wounds clean and dry   Increase activity slowly   Complete by: As directed        Discharge Medications     Allergies as of 04/05/2021      Reactions  Brilinta [ticagrelor] Diarrhea, Nausea Only   Nausea and severe diarrhea- general weakness. Patient says does not want to take again   Budesonide Palpitations   Albuterol Other (See Comments)   Heart racing      Medication List    STOP taking these medications   acetaminophen 650 MG CR tablet Commonly known as: TYLENOL Replaced by: acetaminophen 325 MG tablet     TAKE these medications   acetaminophen 325 MG tablet Commonly known as: TYLENOL Take 2 tablets (650 mg total) by mouth every 6 (six) hours as needed for mild pain, fever or headache. Replaces: acetaminophen 650 MG CR tablet   aspirin EC 81 MG tablet Take 1 tablet (81 mg total) by mouth daily with breakfast. Swallow whole. Start taking on: Apr 07, 2021 What changed:   when to take this  These instructions start on Apr 07, 2021. If you are unsure what to do until then, ask your doctor or other care provider.   atorvastatin 80 MG tablet Commonly known as: LIPITOR Take 1 tablet (80 mg total) by mouth  daily.   b complex vitamins tablet Take 1 tablet by mouth every morning.   carboxymethylcellulose 0.5 % Soln Commonly known as: REFRESH PLUS Place 1 drop into both eyes in the morning, at noon, in the evening, and at bedtime.   clopidogrel 75 MG tablet Commonly known as: PLAVIX Take 75 mg by mouth daily.   diclofenac Sodium 1 % Gel Commonly known as: VOLTAREN Apply 2 g topically 4 (four) times daily.   DULoxetine 30 MG capsule Commonly known as: CYMBALTA Take 3 capsules (90 mg total) by mouth daily. What changed: how much to take   EQ VISION FORMULA 50+ PO Take 1 tablet by mouth every morning.   losartan 50 MG tablet Commonly known as: COZAAR Take 1 tablet by mouth daily.   metoprolol succinate 100 MG 24 hr tablet Commonly known as: TOPROL-XL TAKE 1 AND 1/2 TABLETS DAILY What changed:   how much to take  how to take this  when to take this  additional instructions   MULTIVITAMIN ADULT PO Take 1 tablet by mouth daily.   nitroGLYCERIN 0.4 MG SL tablet Commonly known as: Nitrostat Place 1 tablet (0.4 mg total) under the tongue every 5 (five) minutes x 3 doses as needed for chest pain (if no relief after 3rd dose, proceed to the ED or call 911).   ondansetron 4 MG tablet Commonly known as: Zofran Take 1 tablet (4 mg total) by mouth daily as needed for nausea or vomiting.   oxyCODONE 5 MG immediate release tablet Commonly known as: Oxy IR/ROXICODONE Take 5 mg by mouth every 6 (six) hours as needed for moderate pain.   PAIN MANAGEMENT INTRATHECAL (IT) PUMP by Intrathecal route.   polyethylene glycol powder 17 GM/SCOOP powder Commonly known as: GLYCOLAX/MIRALAX Take 17 g by mouth daily as needed for moderate constipation.   pregabalin 50 MG capsule Commonly known as: LYRICA Take 50 mg by mouth 3 (three) times daily.   senna-docusate 8.6-50 MG tablet Commonly known as: Senokot-S 1 tablet once daily            Durable Medical Equipment  (From  admission, onward)         Start     Ordered   03/25/21 1342  For home use only DME Walker rolling  Once       Question Answer Comment  Walker: With 5 Inch Wheels   Patient needs a walker  to treat with the following condition Gait instability      03/25/21 1341           Discharge Care Instructions  (From admission, onward)         Start     Ordered   04/05/21 0000  Discharge wound care:       Comments: Keep wounds clean and dry   04/05/21 1308          Major procedures and Radiology Reports - PLEASE review detailed and final reports for all details, in brief -   CT ABDOMEN PELVIS W CONTRAST  Result Date: 04/02/2021 CLINICAL DATA:  Recent laparoscopic cholecystectomy, abdominal pain, umbilical firmness EXAM: CT ABDOMEN AND PELVIS WITH CONTRAST TECHNIQUE: Multidetector CT imaging of the abdomen and pelvis was performed using the standard protocol following bolus administration of intravenous contrast. CONTRAST:  29mL OMNIPAQUE IOHEXOL 300 MG/ML  SOLN COMPARISON:  03/25/2021 FINDINGS: Lower chest: Hypoventilatory changes are seen at the lung bases. Hepatobiliary: Postsurgical changes are seen from cholecystectomy. Gas and fluid within the gallbladder fossa measuring up to 6.6 x 5.8 cm consistent with recent surgery. No peripheral enhancement to suggest abscess. Diffuse pneumobilia consistent with recent bile duct manipulation. The liver is unremarkable. Pancreas: Unremarkable. No pancreatic ductal dilatation or surrounding inflammatory changes. Spleen: Normal in size without focal abnormality. Adrenals/Urinary Tract: Suspected renal cell carcinoma upper pole left kidney measuring 2 cm on image 23. Right kidney is unremarkable. No urinary tract calculi or obstructive uropathy. The adrenals and bladder are normal. Stomach/Bowel: No bowel obstruction or ileus. Scattered distal colonic diverticulosis without diverticulitis. No bowel wall thickening or inflammatory change.  Vascular/Lymphatic: Aortic atherosclerosis. No enlarged abdominal or pelvic lymph nodes. Reproductive: Status post hysterectomy. No adnexal masses. Other: A fat containing umbilical hernia is identified. Since the prior exam, there is significant fat stranding and inflammatory change within the herniated fat, concerning for incarceration. No bowel herniation. There is no free fluid or free intraperitoneal gas. Musculoskeletal: Stable subacute L1 compression fracture and retropulsion with greater than 75% narrowing of the central canal. Stable sclerosis within the L2 vertebral body and left inferior pubic ramus concerning for metastatic disease. Reconstructed images demonstrate no additional findings. IMPRESSION: 1. Incarcerated fat containing umbilical hernia. No bowel herniation. 2. Postoperative changes from cholecystectomy, with likely postoperative hematoma or seroma within the gallbladder fossa. No evidence of abscess at this time. 3. 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 4. Stable subacute L1 compression deformity with retropulsion and significant central canal stenosis unchanged. 5. Stable sclerotic lesions within the L2 vertebral body and left inferior pubic ramus, consistent with bony metastatic disease in a patient with a history of breast cancer. 6.  Aortic Atherosclerosis (ICD10-I70.0). These results will be called to the ordering clinician or representative by the Radiologist Assistant, and communication documented in the PACS or [employer]. Electronically Signed   By: Randa Ngo M.D.   On: 04/02/2021 22:25   CT ABDOMEN PELVIS W CONTRAST  Result Date: 03/25/2021 CLINICAL DATA:  Acute abdominal pain. History of MI, hypertension, congestive heart failure, breast cancer. EXAM: CT ABDOMEN AND PELVIS WITH CONTRAST TECHNIQUE: Multidetector CT imaging of the abdomen and pelvis was performed using the standard protocol following bolus administration of intravenous contrast.  CONTRAST:  151mL OMNIPAQUE IOHEXOL 300 MG/ML  SOLN COMPARISON:  02/19/2021 FINDINGS: Lower chest: Lung bases are clear. Hepatobiliary: Gallbladder wall thickening and edema with infiltration into the pericholecystic fat. Small stones layering in the gallbladder. Extrahepatic bile duct dilatation  with suggestion of stones in the distal common bile duct. Moderate intrahepatic bile duct dilatation. No focal liver lesions. Small amount of perihepatic ascites. Pancreas: Unremarkable. No pancreatic ductal dilatation or surrounding inflammatory changes. Spleen: Normal in size without focal abnormality. Adrenals/Urinary Tract: No adrenal gland nodules. Complex hypoenhancing lesion demonstrated in the left kidney measuring 1.9 cm diameter. This may represent a renal cell carcinoma. Similar appearance to previous study. No hydronephrosis or hydroureter. Bladder wall is thickened, probably due to under distention although cystitis could also have this appearance. Stomach/Bowel: Stomach, small bowel, and colon are not abnormally distended. Scattered colonic diverticula without evidence of diverticulitis. Infiltration and wall thickening around the proximal duodenum probably is referred inflammation from the gallbladder. Duodenitis could also be present. Appendix is not identified. Vascular/Lymphatic: Aortic atherosclerosis. No enlarged abdominal or pelvic lymph nodes. Reproductive: Uterus is surgically absent. No abnormal adnexal masses. Other: No free air in the abdomen. Moderate-sized periumbilical hernia containing fat. Musculoskeletal: Degenerative changes in the spine. Lumbar scoliosis convex towards the left. Sclerotic bone lesions consistent with metastatic disease and vertebral compression of L2. IMPRESSION: 1. Cholelithiasis with changes of acute cholecystitis. Bile duct dilatation with probable stones in the distal common bile duct. 2. Infiltration and wall thickening around the proximal duodenum probably is referred  inflammation from the gallbladder. Duodenitis could also have this appearance. 3. Complex hypoenhancing lesion in the left kidney may represent a renal cell carcinoma. No change since prior study. 4. Sclerotic bone lesions consistent with metastatic disease. Compression of L2. 5. Moderate-sized periumbilical hernia containing fat. 6. Aortic atherosclerosis. Aortic Atherosclerosis (ICD10-I70.0). Electronically Signed   By: Lucienne Capers M.D.   On: 03/25/2021 00:50   DG CHEST PORT 1 VIEW  Result Date: 03/30/2021 CLINICAL DATA:  Acute on chronic respiratory failure.  Hypercapnia. EXAM: PORTABLE CHEST 1 VIEW COMPARISON:  November 15, 2019 FINDINGS: The heart size and mediastinal contours are within normal limits. Both lungs are clear. The visualized skeletal structures are unremarkable. IMPRESSION: No active disease. Electronically Signed   By: Dorise Bullion III M.D   On: 03/30/2021 08:52   DG ERCP BILIARY & PANCREATIC DUCTS  Result Date: 03/28/2021 CLINICAL DATA:  Choledocholithiasis EXAM: ERCP TECHNIQUE: Multiple spot images obtained with the fluoroscopic device and submitted for interpretation post-procedure. COMPARISON:  CT abdomen pelvis-03/25/2021 FLUOROSCOPY TIME:  2 minutes, 2 seconds (39.4 mGy) FINDINGS: Multiple spot fluoroscopic images the right upper abdominal quadrant during ERCP are provided for review. Initial image demonstrates an ERCP probe overlying the right upper abdominal quadrant. Subsequent images demonstrate selective cannulation opacification of the common duct which appears moderately dilated. There are several transient nonocclusive filling defects within the CBD which could represent air bubbles versus nonocclusive choledocholithiasis. Subsequent images demonstrate insufflation of a balloon within the central aspect of the CBD with subsequent biliary sweeping and presumed stone extraction and sphincterotomy There is minimal opacification of the central aspect of the intrahepatic  biliary tree which appears mildly dilated. There is faint opacification of the gallbladder lumen. There is no definitive opacification of the pancreatic duct. IMPRESSION: ERCP with biliary sweeping and presumed stone extraction and sphincterotomy as above. These images were submitted for radiologic interpretation only. Please see the procedural report for the amount of contrast and the fluoroscopy time utilized. Electronically Signed   By: Sandi Mariscal M.D.   On: 03/28/2021 14:23   ECHOCARDIOGRAM COMPLETE  Result Date: 03/25/2021    ECHOCARDIOGRAM REPORT   Patient Name:   Stacey [name] Date of Exam: 03/25/2021 Medical Rec #:  [id number]         Height:       60.0 in Accession #:    [id number]        Weight:       200.2 lb Date of Birth:  August 25, 1946          BSA:          1.867 m Patient Age:    15 years          BP:           141/72 mmHg Patient Gender: F                 HR:           71 bpm. Exam Location:  [facility] Procedure: 2D Echo, Cardiac Doppler and Color Doppler Indications:    CHF  History:        Patient has prior history of Echocardiogram examinations, most                 recent 04/18/2020. CHF, CAD, COPD, Aortic Valve Disease and                 Aortic stenosis; Risk Factors:Hypertension, Dyslipidemia and                 morbid obestiy. Metastatic breast cancer.  Sonographer:    Dustin Flock RDCS Referring Phys: [phone] DAVID TAT IMPRESSIONS  1. Left ventricular ejection fraction, by estimation, is 50 to 55%. The left ventricle has low normal function. The left ventricle has no regional wall motion abnormalities. Left ventricular diastolic parameters are consistent with Grade I diastolic dysfunction (impaired relaxation).  2. Right ventricular systolic function is normal. The right ventricular size is normal. There is mildly elevated pulmonary artery systolic pressure. The estimated right ventricular systolic pressure is 0000000 mmHg.  3. Left atrial size was upper normal.  4. The mitral valve is  grossly normal. Trivial mitral valve regurgitation.  5. The aortic valve is tricuspid with some fusion of left and noncoronary cusps. There is moderate calcification of the aortic valve. Aortic valve regurgitation is not visualized. Moderate aortic valve stenosis. Aortic valve mean gradient measures 10.5 mmHg. Dimentionless index 0.31.  6. The inferior vena cava is normal in size with greater than 50% respiratory variability, suggesting right atrial pressure of 3 mmHg. FINDINGS  Left Ventricle: Left ventricular ejection fraction, by estimation, is 50 to 55%. The left ventricle has low normal function. The left ventricle has no regional wall motion abnormalities. The left ventricular internal cavity size was normal in size. There is borderline left ventricular hypertrophy. Abnormal (paradoxical) septal motion, consistent with left bundle branch block. Left ventricular diastolic parameters are consistent with Grade I diastolic dysfunction (impaired relaxation). Right Ventricle: The right ventricular size is normal. No increase in right ventricular wall thickness. Right ventricular systolic function is normal. There is mildly elevated pulmonary artery systolic pressure. The tricuspid regurgitant velocity is 3.24  m/s, and with an assumed right atrial pressure of 3 mmHg, the estimated right ventricular systolic pressure is 0000000 mmHg. Left Atrium: Left atrial size was upper normal. Right Atrium: Right atrial size was normal in size. Pericardium: There is no evidence of pericardial effusion. Mitral Valve: The mitral valve is grossly normal. Mild mitral annular calcification. Trivial mitral valve regurgitation. Tricuspid Valve: The tricuspid valve is grossly normal. Tricuspid valve regurgitation is mild. Aortic Valve: The aortic valve is tricuspid. There is moderate calcification of the aortic valve. There is moderate  aortic valve annular calcification. Aortic valve regurgitation is not visualized. Moderate aortic stenosis  is present. Aortic valve mean gradient measures 10.5 mmHg. Aortic valve peak gradient measures 18.1 mmHg. Aortic valve area, by VTI measures 1.09 cm. Pulmonic Valve: The pulmonic valve was grossly normal. Pulmonic valve regurgitation is trivial. Aorta: The aortic root is normal in size and structure. Venous: The inferior vena cava is normal in size with greater than 50% respiratory variability, suggesting right atrial pressure of 3 mmHg. IAS/Shunts: No atrial level shunt detected by color flow Doppler.  LEFT VENTRICLE PLAX 2D LVIDd:         4.35 cm  Diastology LVIDs:         3.18 cm  LV e' medial:    5.44 cm/s LV PW:         1.05 cm  LV E/e' medial:  11.7 LV IVS:        1.08 cm  LV e' lateral:   7.40 cm/s LVOT diam:     2.10 cm  LV E/e' lateral: 8.6 LV SV:         52 LV SV Index:   28 LVOT Area:     3.46 cm  RIGHT VENTRICLE RV Basal diam:  3.00 cm RV S prime:     7.07 cm/s TAPSE (M-mode): 2.1 cm LEFT ATRIUM             Index       RIGHT ATRIUM           Index LA diam:        3.80 cm 2.04 cm/m  RA Area:     11.70 cm LA Vol (A2C):   37.5 ml 20.08 ml/m RA Volume:   32.00 ml  17.14 ml/m LA Vol (A4C):   60.0 ml 32.13 ml/m LA Biplane Vol: 50.8 ml 27.20 ml/m  AORTIC VALVE AV Area (Vmax):    1.10 cm AV Area (Vmean):   1.16 cm AV Area (VTI):     1.09 cm AV Vmax:           213.00 cm/s AV Vmean:          149.000 cm/s AV VTI:            0.477 m AV Peak Grad:      18.1 mmHg AV Mean Grad:      10.5 mmHg LVOT Vmax:         67.90 cm/s LVOT Vmean:        50.000 cm/s LVOT VTI:          0.150 m LVOT/AV VTI ratio: 0.31  AORTA Ao Root diam: 2.70 cm MITRAL VALVE               TRICUSPID VALVE MV Area (PHT): 2.68 cm    TR Peak grad:   42.0 mmHg MV Decel Time: 283 msec    TR Vmax:        324.00 cm/s MV E velocity: 63.80 cm/s MV A velocity: 89.50 cm/s  SHUNTS MV E/A ratio:  0.71        Systemic VTI:  0.15 m                            Systemic Diam: 2.10 cm Rozann Lesches MD Electronically signed by Rozann Lesches MD Signature  Date/Time: 03/25/2021/2:24:21 PM    Final     Micro Results   Recent Results (from the past 240 hour(s))  Surgical pcr screen     Status: Abnormal   Collection Time: 03/29/21  8:41 AM   Specimen: Nasal Mucosa; Nasal Swab  Result Value Ref Range Status   MRSA, PCR POSITIVE (A) NEGATIVE Final    Comment: RESULT CALLED TO, READ BACK BY AND VERIFIED WITH: TORI RN @ 1118 ON [id number] BY HENDERSON L.    Staphylococcus aureus POSITIVE (A) NEGATIVE Final    Comment: RESULT CALLED TO, READ BACK BY AND VERIFIED WITH: TORI RN @ [id number] ON [id number] BY HENDERSON L. (NOTE) The Xpert SA Assay (FDA approved for NASAL specimens in patients 50 years of age and older), is one component of a comprehensive surveillance program. It is not intended to diagnose infection nor to guide or monitor treatment. Performed at [facility], [address] [place], [place] [address]    Today   Norris today has no new complaints  No fever  Or chills   No Nausea, Vomiting or Diarrhea     Patient has been seen and examined prior to discharge   Objective   Blood pressure 133/62, pulse 83, temperature 98.2 F (36.8 C), resp. rate 19, height 5' (1.524 m), weight 92.8 kg, SpO2 96 %.   Intake/Output Summary (Last 24 hours) at 04/05/2021 1308 Last data filed at 04/05/2021 0904 Gross per 24 hour  Intake 720 ml  Output 2100 ml  Net -1380 ml    Exam Gen:- Awake Alert, no acute distress  HEENT:- [place].AT, No sclera icterus Neck-Supple Neck,No JVD,.  Lungs-  CTAB , good air movement bilaterally  CV- S1, S2 normal, regular Abd-  +ve B.Sounds, Abd Soft, appropriate postop tenderness  extremity/Skin:- No  edema,   good pulses Psych-affect is appropriate, oriented x3 Neuro-no new focal deficits, no tremors    Data Review   CBC w Diff:  Lab Results  Component Value Date   WBC 4.8 04/04/2021   HGB 8.6 (L) 04/04/2021   HCT 27.0 (L) 04/04/2021   PLT 214 04/04/2021   LYMPHOPCT 23 04/02/2021    MONOPCT 8 04/02/2021   EOSPCT 5 04/02/2021   BASOPCT 0 04/02/2021    CMP:  Lab Results  Component Value Date   NA 139 04/04/2021   K 4.8 04/04/2021   CL 100 04/04/2021   CO2 33 (H) 04/04/2021   BUN 8 04/04/2021   CREATININE 0.65 04/04/2021   PROT 5.2 (L) 04/04/2021   ALBUMIN 2.3 (L) 04/04/2021   BILITOT 0.9 04/04/2021   ALKPHOS 60 04/04/2021   AST 18 04/04/2021   ALT 19 04/04/2021  .   Total Discharge time is about 33 minutes  Roxan Hockey M.D on 04/05/2021 at 1:08 PM  Go to [email or web address] -  for contact info  [facility] - Office  [phone]

## 2021-04-05 NOTE — Plan of Care (Signed)
  Problem: Education: Goal: Knowledge of General Education information will improve Description Including pain rating scale, medication(s)/side effects and non-pharmacologic comfort measures Outcome: Progressing   Problem: Health Behavior/Discharge Planning: Goal: Ability to manage health-related needs will improve Outcome: Progressing   

## 2021-04-11 ENCOUNTER — Ambulatory Visit: Payer: Medicare Other | Admitting: General Surgery

## 2021-04-18 ENCOUNTER — Encounter: Payer: Medicare Other | Admitting: General Surgery

## 2021-04-23 ENCOUNTER — Encounter: Payer: Medicare Other | Admitting: General Surgery

## 2021-05-01 ENCOUNTER — Emergency Department (HOSPITAL_COMMUNITY): Payer: Medicare Other

## 2021-05-01 ENCOUNTER — Emergency Department (HOSPITAL_COMMUNITY)
Admission: EM | Admit: 2021-05-01 | Discharge: 2021-05-02 | Disposition: A | Discharge status: Home / Self Care | Payer: Medicare Other | Attending: Emergency Medicine | Admitting: Emergency Medicine

## 2021-05-01 DIAGNOSIS — Z20822 Contact with and (suspected) exposure to covid-19: Secondary | ICD-10-CM | POA: Insufficient documentation

## 2021-05-01 DIAGNOSIS — Z7982 Long term (current) use of aspirin: Secondary | ICD-10-CM | POA: Insufficient documentation

## 2021-05-01 DIAGNOSIS — R531 Weakness: Secondary | ICD-10-CM

## 2021-05-01 DIAGNOSIS — Z7902 Long term (current) use of antithrombotics/antiplatelets: Secondary | ICD-10-CM | POA: Insufficient documentation

## 2021-05-01 DIAGNOSIS — I2510 Atherosclerotic heart disease of native coronary artery without angina pectoris: Secondary | ICD-10-CM | POA: Diagnosis not present

## 2021-05-01 DIAGNOSIS — I110 Hypertensive heart disease with heart failure: Secondary | ICD-10-CM | POA: Insufficient documentation

## 2021-05-01 DIAGNOSIS — I10 Essential (primary) hypertension: Secondary | ICD-10-CM | POA: Diagnosis not present

## 2021-05-01 DIAGNOSIS — L02211 Cutaneous abscess of abdominal wall: Secondary | ICD-10-CM | POA: Diagnosis not present

## 2021-05-01 DIAGNOSIS — Z87891 Personal history of nicotine dependence: Secondary | ICD-10-CM | POA: Diagnosis not present

## 2021-05-01 DIAGNOSIS — Z853 Personal history of malignant neoplasm of breast: Secondary | ICD-10-CM | POA: Insufficient documentation

## 2021-05-01 DIAGNOSIS — I5043 Acute on chronic combined systolic (congestive) and diastolic (congestive) heart failure: Secondary | ICD-10-CM | POA: Diagnosis not present

## 2021-05-01 DIAGNOSIS — J441 Chronic obstructive pulmonary disease with (acute) exacerbation: Secondary | ICD-10-CM | POA: Insufficient documentation

## 2021-05-01 DIAGNOSIS — Z79899 Other long term (current) drug therapy: Secondary | ICD-10-CM | POA: Insufficient documentation

## 2021-05-01 LAB — CBC WITH DIFFERENTIAL/PLATELET
Abs Immature Granulocytes: 0.03 10*3/uL (ref 0.00–0.07)
Basophils Absolute: 0 10*3/uL (ref 0.0–0.1)
Basophils Relative: 0 %
Eosinophils Absolute: 0.2 10*3/uL (ref 0.0–0.5)
Eosinophils Relative: 4 %
HCT: 28.5 % — ABNORMAL LOW (ref 36.0–46.0)
Hemoglobin: 9.4 g/dL — ABNORMAL LOW (ref 12.0–15.0)
Immature Granulocytes: 1 %
Lymphocytes Relative: 31 %
Lymphs Abs: 1.5 10*3/uL (ref 0.7–4.0)
MCH: 32.8 pg (ref 26.0–34.0)
MCHC: 33 g/dL (ref 30.0–36.0)
MCV: 99.3 fL (ref 80.0–100.0)
Monocytes Absolute: 0.3 10*3/uL (ref 0.1–1.0)
Monocytes Relative: 7 %
Neutro Abs: 2.7 10*3/uL (ref 1.7–7.7)
Neutrophils Relative %: 57 %
Platelets: 177 10*3/uL (ref 150–400)
RBC: 2.87 MIL/uL — ABNORMAL LOW (ref 3.87–5.11)
RDW: 13.4 % (ref 11.5–15.5)
WBC: 4.7 10*3/uL (ref 4.0–10.5)
nRBC: 0 % (ref 0.0–0.2)

## 2021-05-01 LAB — COMPREHENSIVE METABOLIC PANEL
ALT: 12 U/L (ref 0–44)
AST: 20 U/L (ref 15–41)
Albumin: 3.1 g/dL — ABNORMAL LOW (ref 3.5–5.0)
Alkaline Phosphatase: 70 U/L (ref 38–126)
Anion gap: 9 (ref 5–15)
BUN: 20 mg/dL (ref 8–23)
CO2: 31 mmol/L (ref 22–32)
Calcium: 11.8 mg/dL — ABNORMAL HIGH (ref 8.9–10.3)
Chloride: 100 mmol/L (ref 98–111)
Creatinine, Ser: 0.87 mg/dL (ref 0.44–1.00)
GFR, Estimated: >60 mL/min (ref >60)
Glucose, Bld: 98 mg/dL (ref 70–99)
Potassium: 4.1 mmol/L (ref 3.5–5.1)
Sodium: 140 mmol/L (ref 135–145)
Total Bilirubin: 1.5 mg/dL — ABNORMAL HIGH (ref 0.3–1.2)
Total Protein: 6 g/dL — ABNORMAL LOW (ref 6.5–8.1)

## 2021-05-01 LAB — RESP PANEL BY RT-PCR (FLU A&B, COVID) ARPGX2
Influenza A by PCR: NEGATIVE
Influenza B by PCR: NEGATIVE
SARS Coronavirus 2 by RT PCR: NEGATIVE

## 2021-05-01 LAB — TSH: TSH: 1.425 u[IU]/mL (ref 0.350–4.500)

## 2021-05-01 LAB — LIPASE, BLOOD: Lipase: 18 U/L (ref 11–51)

## 2021-05-01 LAB — TROPONIN I (HIGH SENSITIVITY): Troponin I (High Sensitivity): 7 ng/L (ref <18)

## 2021-05-01 NOTE — ED Provider Notes (Signed)
[facility] EMERGENCY DEPARTMENT Provider Note   CSN: [id number] Arrival date & time: 05/01/21  1851     History Chief Complaint  Patient presents with  . Weakness    [name] is a 75 y.o. female.  HPI   75 year old female with a history of CAD, CHF, COPD, hypertension, hyperlipidemia, metastatic breast cancer, MI, mild aortic stenosis, paroxysmal atrial fibrillation, who presents the emergency department today for evaluation of elevated blood pressure.  She states that her son took her blood pressure at home and it was noted to be high.  When asked why he was taking her blood pressure she notes that she has been feeling well for the last several days and states she has been more lethargic and sleepy than normal.  She reports she is had a cough recently.  She denies any chest pain, shortness of breath, fevers, vomiting, diarrhea, urinary complaints.  She does complain of some exacerbation of her chronic back pain.  Past Medical History:  Diagnosis Date  . CAD in native artery    a. DES to ramus 2005 with late stent thrombosis 2006 tx with PTCA. 12/18 PCI/DES x1 to mRCA, EF 50-55%  . CHF (congestive heart failure) ([place])   . Chronic pain   . COPD (chronic obstructive pulmonary disease) ([place])   . Hyperlipidemia   . Hypertension   . Left bundle branch block   . Lymphedema   . Metastatic breast cancer ([place])    a. to bone.  . MI (myocardial infarction) ([place])   . Mild aortic stenosis 10/2017  . Morbid obesity ([place])   . PAF (paroxysmal atrial fibrillation) ([place])   . PSVT (paroxysmal supraventricular tachycardia) ([place])    a. per [facility] notes, seen on event monitor in 2014.  . Pulmonary nodules     Patient Active Problem List   Diagnosis Date Noted  . Incarcerated umbilical hernia   . Cholecystitis   . Elevated bilirubin   . Cholecystitis, acute with cholelithiasis 03/25/2021  . Abdominal pain 03/25/2021  . Leukocytosis 03/25/2021  . Hypokalemia 03/25/2021  .  Hyperglycemia 03/25/2021  . Chronic combined systolic and diastolic CHF (congestive heart failure) ([place]) 03/25/2021  . Obesity, Class II, BMI 35-39.9 03/25/2021  . Choledocholithiasis   . Rectal bleeding 08/23/2020  . Supratherapeutic INR 08/23/2020  . Left hip pain   . Pain of metastatic malignancy 06/27/2020  . Malignant neoplasm of right breast in female, estrogen receptor positive ([place])   . Goals of care, counseling/discussion   . Advanced care planning/counseling discussion   . Spinal stenosis of lumbar region   . Bone metastases ([place])   . Palliative care by specialist   . Lumbar pain 04/24/2020  . Lumbar radiculopathy 02/16/2020  . Palpitations 11/16/2019  . Dysuria   . Acute bilateral low back pain 10/02/2019  . Bipolar 2 disorder, major depressive episode ([place]) 09/01/2019  . Adjustment disorder with depressed mood 06/30/2019  . Atrial fibrillation with rapid ventricular response ([place]) 11/14/2018  . Fall   . Obesity, Class III, BMI 40-49.9 (morbid obesity) ([place])   . Chronic respiratory failure with hypoxia ([place])   . Abnormal transaminases 10/07/2018  . PSVT (paroxysmal supraventricular tachycardia) ([place]) 10/07/2018  . Hyperlipidemia 10/07/2018  . COPD (chronic obstructive pulmonary disease) ([place]) 10/07/2018  . CAD in native artery 10/07/2018  . Chronic pain 10/07/2018  . Acute back pain 10/07/2018  . Acute hypoxemic respiratory failure ([place]) 09/19/2018  . Acute on chronic combined systolic and diastolic CHF (congestive heart failure) (  [place]) 09/19/2018  . COPD with acute exacerbation ([place]) 09/19/2018  . Dyspnea 11/30/2017  . Non-ST elevation (NSTEMI) myocardial infarction ([place])   . Hypertension 11/23/2017  . Left bundle branch block 11/23/2017  . Acute on chronic diastolic CHF (congestive heart failure) ([place]) 11/23/2017  . CAD (coronary artery disease) 11/23/2017  . Metastatic breast cancer ([place]) 11/23/2017  . Acute respiratory failure with hypoxia ([place]) 11/23/2017  .  Atypical nevus 07/29/2016  . Age-related nuclear cataract, bilateral 02/12/2016  . Anxiety and depression 01/24/2016  . Arthropathy, lower leg 09/14/2013  . Tibialis tendinitis 02/22/2013  . Plantar fasciitis 01/17/2013    Past Surgical History:  Procedure Laterality Date  . CHOLECYSTECTOMY N/A 03/29/2021   Procedure: LAPAROSCOPIC CHOLECYSTECTOMY;  Surgeon: [name], MD;  Location: [facility];  Service: General;  Laterality: N/A;  . CORONARY STENT INTERVENTION N/A 11/26/2017   Procedure: CORONARY STENT INTERVENTION;  Surgeon: Martinique, Peter M, MD;  Location: [facility];  Service: Cardiovascular;  Laterality: N/A;  . CORONARY STENT PLACEMENT    . ERCP N/A 03/28/2021   Procedure: ENDOSCOPIC RETROGRADE CHOLANGIOPANCREATOGRAPHY (ERCP);  Surgeon: Rogene Houston, MD;  Location: [facility];  Service: Endoscopy;  Laterality: N/A;  . intrathecal pain pump    . LEFT HEART CATH AND CORONARY ANGIOGRAPHY N/A 11/26/2017   Procedure: LEFT HEART CATH AND CORONARY ANGIOGRAPHY;  Surgeon: Martinique, Peter M, MD;  Location: [facility];  Service: Cardiovascular;  Laterality: N/A;  . REMOVAL OF STONES  03/28/2021   Procedure: REMOVAL OF STONES;  Surgeon: Rogene Houston, MD;  Location: [facility];  Service: Endoscopy;;  . Joan Mayans  03/28/2021   Procedure: SPHINCTEROTOMY;  Surgeon: Rogene Houston, MD;  Location: [facility];  Service: Endoscopy;;  . UMBILICAL HERNIA REPAIR N/A 04/03/2021   Procedure: ADULT PRIMARY UMBILICAL HERNIA REPAIR;  Surgeon: [name], MD;  Location: [facility];  Service: General;  Laterality: N/A;  . VASCULAR SURGERY       OB History   No obstetric history on file.     Family History  Problem Relation Age of Onset  . CAD Father 74  . Heart attack Father   . COPD Sister   . CAD Paternal Grandmother   . Sudden Cardiac Death Neg Hx   . Colon polyps Neg Hx   . Colon cancer Neg Hx     Social History   Tobacco Use  . Smoking status: Former Smoker     Packs/day: 2.00    Years: 18.00    Pack years: 36.00    Types: Cigarettes    Quit date: 12/01/1980    Years since quitting: 40.4  . Smokeless tobacco: Never Used  Vaping Use  . Vaping Use: Never used  Substance Use Topics  . Alcohol use: No  . Drug use: Yes    Types: Oxycodone, Morphine    Comment: takes methadone    Home Medications Prior to Admission medications   Medication Sig Start Date End Date Taking? Authorizing Provider  acetaminophen (TYLENOL) 325 MG tablet Take 2 tablets (650 mg total) by mouth every 6 (six) hours as needed for mild pain, fever or headache. 04/05/21   Roxan Hockey, MD  aspirin EC 81 MG tablet Take 1 tablet (81 mg total) by mouth daily with breakfast. Swallow whole. 04/07/21   Roxan Hockey, MD  atorvastatin (LIPITOR) 80 MG tablet Take 1 tablet (80 mg total) by mouth daily. 05/28/20   Arnoldo Lenis, MD  b complex vitamins tablet Take  1 tablet by mouth every morning. 07/05/20   Florencia Reasons, MD  carboxymethylcellulose (REFRESH PLUS) 0.5 % SOLN Place 1 drop into both eyes in the morning, at noon, in the evening, and at bedtime.    [provider]  clopidogrel (PLAVIX) 75 MG tablet Take 75 mg by mouth daily.    [provider]  diclofenac Sodium (VOLTAREN) 1 % GEL Apply 2 g topically 4 (four) times daily. 10/06/20 10/06/21  [provider]  DULoxetine (CYMBALTA) 30 MG capsule Take 3 capsules (90 mg total) by mouth daily. Patient taking differently: Take 30 mg by mouth daily. 07/06/20   Florencia Reasons, MD  losartan (COZAAR) 50 MG tablet Take 1 tablet by mouth daily. 03/22/21   [provider]  metoprolol succinate (TOPROL-XL) 100 MG 24 hr tablet TAKE 1 AND 1/2 TABLETS DAILY Patient taking differently: Take 150 mg by mouth daily. 03/28/20   Arnoldo Lenis, MD  Multiple Vitamin (MULTIVITAMIN ADULT PO) Take 1 tablet by mouth daily.    [provider]  Multiple Vitamins-Minerals (EQ VISION FORMULA 50+ PO) Take 1 tablet by mouth  every morning.    [provider]  nitroGLYCERIN (NITROSTAT) 0.4 MG SL tablet Place 1 tablet (0.4 mg total) under the tongue every 5 (five) minutes x 3 doses as needed for chest pain (if no relief after 3rd dose, proceed to the ED or call 911). 03/22/20   Arnoldo Lenis, MD  ondansetron (ZOFRAN) 4 MG tablet Take 1 tablet (4 mg total) by mouth daily as needed for nausea or vomiting. 04/05/21 04/05/22  Roxan Hockey, MD  oxyCODONE (OXY IR/ROXICODONE) 5 MG immediate release tablet Take 5 mg by mouth every 6 (six) hours as needed for moderate pain. 12/27/20   [provider]  PAIN MANAGEMENT INTRATHECAL, IT, PUMP by Intrathecal route. 02/21/21   [provider]  polyethylene glycol powder (GLYCOLAX/MIRALAX) 17 GM/SCOOP powder Take 17 g by mouth daily as needed for moderate constipation.     [provider]  pregabalin (LYRICA) 50 MG capsule Take 50 mg by mouth 3 (three) times daily. 01/26/21   [provider]  senna-docusate (SENOKOT-S) 8.6-50 MG tablet 1 tablet once daily    [provider]    Allergies    Brilinta [ticagrelor], Budesonide, and Albuterol  Review of Systems   Review of Systems  Constitutional: Positive for fatigue. Negative for fever.  HENT: Negative for ear pain and sore throat.   Eyes: Negative for pain and visual disturbance.  Respiratory: Positive for cough. Negative for shortness of breath.   Cardiovascular: Negative for chest pain.  Gastrointestinal: Negative for abdominal pain, constipation, diarrhea, nausea and vomiting.  Genitourinary: Positive for frequency. Negative for dysuria and hematuria.  Musculoskeletal: Negative for back pain (chronic).  Skin: Negative for color change and rash.  Neurological: Negative for headaches.  All other systems reviewed and are negative.   Physical Exam Updated Vital Signs BP (!) 174/66   Pulse 73   Temp (!) 97.4 F (36.3 C) (Oral)   Resp 12   Ht 5' (1.524 m)   Wt 89.8 kg    SpO2 93%   BMI 38.67 kg/m   Physical Exam Vitals and nursing note reviewed.  Constitutional:      General: She is not in acute distress.    Appearance: She is well-developed.  HENT:     Head: Normocephalic and atraumatic.  Eyes:     Conjunctiva/sclera: Conjunctivae normal.  Cardiovascular:     Rate and  Rhythm: Normal rate and regular rhythm.     Heart sounds: Murmur heard.    Pulmonary:     Effort: Pulmonary effort is normal. No respiratory distress.     Breath sounds: Rales (right mid and lower lobe) present. No wheezing or rhonchi.  Abdominal:     General: Bowel sounds are normal.     Palpations: Abdomen is soft.     Comments: No focal ttp  Musculoskeletal:     Cervical back: Neck supple.  Skin:    General: Skin is warm and dry.  Neurological:     Mental Status: She is alert.     ED Results / Procedures / Treatments   Labs (all labs ordered are listed, but only abnormal results are displayed) Labs Reviewed  RESP PANEL BY RT-PCR (FLU A&B, COVID) [id number]  COMPREHENSIVE METABOLIC PANEL  CBC WITH DIFFERENTIAL/PLATELET  URINALYSIS, ROUTINE W REFLEX MICROSCOPIC  LIPASE, BLOOD  TSH  TROPONIN I (HIGH SENSITIVITY)  TROPONIN I (HIGH SENSITIVITY)    EKG None  Radiology DG Chest 2 View  Result Date: 05/01/2021 CLINICAL DATA:  Cough for 3-4 days, hypertension EXAM: CHEST - 2 VIEW COMPARISON:  03/30/2021 FINDINGS: Frontal and lateral views of the chest demonstrate an unremarkable cardiac silhouette. No acute airspace disease, effusion, or pneumothorax. No acute bony abnormalities. Mild right convex thoracic scoliosis unchanged. IMPRESSION: 1. Stable exam, no acute process. Electronically Signed   By: Randa Ngo M.D.   On: 05/01/2021 20:29   CT Head Wo Contrast  Result Date: 05/01/2021 CLINICAL DATA:  Delirium, elevated blood pressure EXAM: CT HEAD WITHOUT CONTRAST TECHNIQUE: Contiguous axial images were obtained from the base of the skull through the vertex without  intravenous contrast. Sagittal and coronal MPR images reconstructed from axial data set. COMPARISON:  11/13/2018 FINDINGS: Brain: Mild generalized atrophy. Stable ventricular morphology. No midline shift or mass effect. No intracranial hemorrhage, mass lesion or evidence of acute infarction. No extra-axial fluid collections. Vascular: No hyperdense vessels Skull: Intact Sinuses/Orbits: Clear Other: N/A IMPRESSION: No acute intracranial abnormalities. Electronically Signed   By: Lavonia Dana M.D.   On: 05/01/2021 20:24    Procedures Procedures   Medications Ordered in ED Medications - No data to display  ED Course  I have reviewed the triage vital signs and the nursing notes.  Pertinent labs & imaging results that were available during my care of the patient were reviewed by me and considered in my medical decision making (see chart for details).    MDM Rules/Calculators/A&P                          75 y/o female presenting to the ed for eval of generalized weakness starting pta.   Reviewed/interpreted imaging CXR - reassuring CT head - reassuring  At shift change, pt care transitioned to Dr. Rogene Houston to f/u on pending labs and make disposition decision.   Final Clinical Impression(s) / ED Diagnoses Final diagnoses:  Weakness  Hypertension, unspecified type    Rx / DC Orders ED Discharge Orders    None       Bishop Dublin 05/01/21 2105    Fredia Sorrow, MD 05/01/21 2348

## 2021-05-01 NOTE — Discharge Instructions (Signed)
Can appointment to follow-up with Dr. Harl Bowie or cardiologist in [place] about your blood pressure control.  Return for any new or worse symptoms.

## 2021-05-01 NOTE — ED Triage Notes (Signed)
EMS called for high blood pressure. 166/84 on arrival.

## 2021-05-02 ENCOUNTER — Ambulatory Visit (HOSPITAL_COMMUNITY): Payer: Medicare Other | Admitting: Hematology

## 2021-05-02 ENCOUNTER — Ambulatory Visit (HOSPITAL_COMMUNITY): Payer: Medicare Other

## 2021-05-02 ENCOUNTER — Other Ambulatory Visit (HOSPITAL_COMMUNITY): Payer: Medicare Other

## 2021-05-02 LAB — URINALYSIS, ROUTINE W REFLEX MICROSCOPIC
Bilirubin Urine: NEGATIVE
Glucose, UA: NEGATIVE mg/dL
Hgb urine dipstick: NEGATIVE
Ketones, ur: NEGATIVE mg/dL
Nitrite: NEGATIVE
Protein, ur: NEGATIVE mg/dL
Specific Gravity, Urine: 1.009 (ref 1.005–1.030)
pH: 7 (ref 5.0–8.0)

## 2021-05-02 LAB — TROPONIN I (HIGH SENSITIVITY): Troponin I (High Sensitivity): 7 ng/L (ref <18)

## 2021-05-02 NOTE — ED Provider Notes (Signed)
  Provider Note MRN:  [id number]  Arrival date & time: 05/02/21    ED Course and Medical Decision Making  Assumed care from Dr. Bobby Rumpf at shift change.  Patient here for high blood pressure and generalized weakness, work-up thus far reassuring with normal CT imaging.  Awaiting second troponin, urinalysis.  Patient has close follow-up with Dr. Harl Bowie of cardiology.  The CT of the abdomen is commenting on some continued evidence of intra-abdominal abscess however it seems improved from prior imaging and she has no fever or abdominal pain to clinically suspect any continued active infection.  Second troponin is negative, urinalysis is normal, appropriate for discharge.  Procedures  Final Clinical Impressions(s) / ED Diagnoses     ICD-10-CM   1. Weakness  R53.1   2. Hypertension, unspecified type  I10     ED Discharge Orders    None        Discharge Instructions     Can appointment to follow-up with Dr. Harl Bowie or cardiologist in [place] about your blood pressure control.  Return for any new or worse symptoms.    Barth Kirks. [place] Small, [place] [email or web address]    Maudie Flakes, MD 05/02/21 [phone]

## 2021-05-03 ENCOUNTER — Other Ambulatory Visit (HOSPITAL_COMMUNITY): Payer: Self-pay | Admitting: *Deleted

## 2021-05-03 DIAGNOSIS — C7951 Secondary malignant neoplasm of bone: Secondary | ICD-10-CM

## 2021-05-03 DIAGNOSIS — E559 Vitamin D deficiency, unspecified: Secondary | ICD-10-CM

## 2021-05-03 DIAGNOSIS — C50911 Malignant neoplasm of unspecified site of right female breast: Secondary | ICD-10-CM

## 2021-05-06 ENCOUNTER — Ambulatory Visit (HOSPITAL_COMMUNITY): Payer: Medicare Other

## 2021-05-06 ENCOUNTER — Inpatient Hospital Stay (HOSPITAL_COMMUNITY): Payer: Medicare Other

## 2021-05-07 ENCOUNTER — Ambulatory Visit (HOSPITAL_COMMUNITY): Payer: Medicare Other

## 2021-05-07 ENCOUNTER — Inpatient Hospital Stay (HOSPITAL_COMMUNITY): Payer: Medicare Other

## 2021-05-08 ENCOUNTER — Other Ambulatory Visit (HOSPITAL_COMMUNITY): Payer: Medicare Other

## 2021-05-08 ENCOUNTER — Ambulatory Visit (HOSPITAL_COMMUNITY): Payer: Medicare Other

## 2021-05-10 ENCOUNTER — Inpatient Hospital Stay (HOSPITAL_COMMUNITY): Payer: Medicare Other | Attending: Hematology

## 2021-05-10 ENCOUNTER — Inpatient Hospital Stay (HOSPITAL_COMMUNITY): Payer: Medicare Other

## 2021-05-10 ENCOUNTER — Other Ambulatory Visit: Payer: Self-pay

## 2021-05-10 ENCOUNTER — Encounter (HOSPITAL_COMMUNITY): Payer: Self-pay

## 2021-05-10 VITALS — BP 157/49 | HR 69 | Temp 98.0°F | Resp 18

## 2021-05-10 DIAGNOSIS — C50911 Malignant neoplasm of unspecified site of right female breast: Secondary | ICD-10-CM | POA: Insufficient documentation

## 2021-05-10 DIAGNOSIS — C7951 Secondary malignant neoplasm of bone: Secondary | ICD-10-CM

## 2021-05-10 DIAGNOSIS — E559 Vitamin D deficiency, unspecified: Secondary | ICD-10-CM

## 2021-05-10 DIAGNOSIS — Z170 Estrogen receptor positive status [ER+]: Secondary | ICD-10-CM | POA: Insufficient documentation

## 2021-05-10 DIAGNOSIS — Z5111 Encounter for antineoplastic chemotherapy: Secondary | ICD-10-CM | POA: Insufficient documentation

## 2021-05-10 LAB — COMPREHENSIVE METABOLIC PANEL
ALT: 15 U/L (ref 0–44)
AST: 19 U/L (ref 15–41)
Albumin: 2.8 g/dL — ABNORMAL LOW (ref 3.5–5.0)
Alkaline Phosphatase: 77 U/L (ref 38–126)
Anion gap: 6 (ref 5–15)
BUN: 19 mg/dL (ref 8–23)
CO2: 35 mmol/L — ABNORMAL HIGH (ref 22–32)
Calcium: 10.6 mg/dL — ABNORMAL HIGH (ref 8.9–10.3)
Chloride: 99 mmol/L (ref 98–111)
Creatinine, Ser: 0.82 mg/dL (ref 0.44–1.00)
GFR, Estimated: >60 mL/min (ref >60)
Glucose, Bld: 121 mg/dL — ABNORMAL HIGH (ref 70–99)
Potassium: 3.7 mmol/L (ref 3.5–5.1)
Sodium: 140 mmol/L (ref 135–145)
Total Bilirubin: 0.9 mg/dL (ref 0.3–1.2)
Total Protein: 5.7 g/dL — ABNORMAL LOW (ref 6.5–8.1)

## 2021-05-10 LAB — CBC WITH DIFFERENTIAL/PLATELET
Abs Immature Granulocytes: 0.02 10*3/uL (ref 0.00–0.07)
Basophils Absolute: 0 10*3/uL (ref 0.0–0.1)
Basophils Relative: 1 %
Eosinophils Absolute: 0.2 10*3/uL (ref 0.0–0.5)
Eosinophils Relative: 4 %
HCT: 27.1 % — ABNORMAL LOW (ref 36.0–46.0)
Hemoglobin: 9.1 g/dL — ABNORMAL LOW (ref 12.0–15.0)
Immature Granulocytes: 1 %
Lymphocytes Relative: 26 %
Lymphs Abs: 1.1 10*3/uL (ref 0.7–4.0)
MCH: 33.7 pg (ref 26.0–34.0)
MCHC: 33.6 g/dL (ref 30.0–36.0)
MCV: 100.4 fL — ABNORMAL HIGH (ref 80.0–100.0)
Monocytes Absolute: 0.4 10*3/uL (ref 0.1–1.0)
Monocytes Relative: 8 %
Neutro Abs: 2.7 10*3/uL (ref 1.7–7.7)
Neutrophils Relative %: 60 %
Platelets: 223 10*3/uL (ref 150–400)
RBC: 2.7 MIL/uL — ABNORMAL LOW (ref 3.87–5.11)
RDW: 13.4 % (ref 11.5–15.5)
WBC: 4.4 10*3/uL (ref 4.0–10.5)
nRBC: 0 % (ref 0.0–0.2)

## 2021-05-10 LAB — MAGNESIUM: Magnesium: 1.7 mg/dL (ref 1.7–2.4)

## 2021-05-10 LAB — VITAMIN D 25 HYDROXY (VIT D DEFICIENCY, FRACTURES): Vit D, 25-Hydroxy: 36.11 ng/mL (ref 30–100)

## 2021-05-10 MED ORDER — FULVESTRANT 250 MG/5ML IM SOLN
500.0000 mg | Freq: Once | INTRAMUSCULAR | Status: AC
  Administered 2021-05-10: 500 mg via INTRAMUSCULAR

## 2021-05-10 NOTE — Patient Instructions (Signed)
[facility]  Discharge Instructions: Thank you for choosing [facility] to provide your oncology and hematology care.  If you have a lab appointment with the [facility], please come in thru the Main Entrance and check in at the main information desk.  Wear comfortable clothing and clothing appropriate for easy access to any Portacath or PICC line.   We strive to give you quality time with your provider. You may need to reschedule your appointment if you arrive late (15 or more minutes).  Arriving late affects you and other patients whose appointments are after yours.  Also, if you miss three or more appointments without notifying the office, you may be dismissed from the clinic at the provider's discretion.      For prescription refill requests, have your pharmacy contact our office and allow 72 hours for refills to be completed.    Today you received the following chemotherapy and/or immunotherapy agents Flaslodex   To help prevent nausea and vomiting after your treatment, we encourage you to take your nausea medication as directed.  BELOW ARE SYMPTOMS THAT SHOULD BE REPORTED IMMEDIATELY: *FEVER GREATER THAN 100.4 F (38 C) OR HIGHER *CHILLS OR SWEATING *NAUSEA AND VOMITING THAT IS NOT CONTROLLED WITH YOUR NAUSEA MEDICATION *UNUSUAL SHORTNESS OF BREATH *UNUSUAL BRUISING OR BLEEDING *URINARY PROBLEMS (pain or burning when urinating, or frequent urination) *BOWEL PROBLEMS (unusual diarrhea, constipation, pain near the anus) TENDERNESS IN MOUTH AND THROAT WITH OR WITHOUT PRESENCE OF ULCERS (sore throat, sores in mouth, or a toothache) UNUSUAL RASH, SWELLING OR PAIN  UNUSUAL VAGINAL DISCHARGE OR ITCHING   Items with * indicate a potential emergency and should be followed up as soon as possible or go to the Emergency Department if any problems should occur.  Please show the CHEMOTHERAPY ALERT CARD or IMMUNOTHERAPY ALERT CARD at check-in to the Emergency  Department and triage nurse.  Should you have questions after your visit or need to cancel or reschedule your appointment, please contact [facility] [phone]  and follow the prompts.  Office hours are 8:00 a.m. to 4:30 p.m. Monday - Friday. Please note that voicemails left after 4:00 p.m. may not be returned until the following business day.  We are closed weekends and major holidays. You have access to a nurse at all times for urgent questions. Please call the main number to the clinic [phone] and follow the prompts.  For any non-urgent questions, you may also contact your provider using MyChart. We now offer e-Visits for anyone 32 and older to request care online for non-urgent symptoms. For details visit [email or web address].   Also download the MyChart app! Go to the app store, search "MyChart", open the app, select [facility], and log in with your MyChart username and password.  Due to Covid, a mask is required upon entering the hospital/clinic. If you do not have a mask, one will be given to you upon arrival. For doctor visits, patients may have 1 support person aged 80 or older with them. For treatment visits, patients cannot have anyone with them due to current Covid guidelines and our immunocompromised population.

## 2021-05-10 NOTE — Progress Notes (Signed)
Patient arrived today via EMS. Patient tolerated Faslodex injection with no complaints voiced. Bilateral sites clean and dry with no bruising or swelling noted. Band aids applied. See MAR for details. Patient stable during and after injections. VSS with discharge and left in satisfactory condition with no s/s of distress noted, EMS.

## 2021-05-13 ENCOUNTER — Telehealth: Payer: Self-pay | Admitting: Cardiology

## 2021-05-13 MED ORDER — ATORVASTATIN CALCIUM 80 MG PO TABS: 80.0000 mg | ORAL_TABLET | Freq: Every day | ORAL | 2 refills | Status: DC

## 2021-05-13 NOTE — Telephone Encounter (Signed)
Refilled per request.

## 2021-05-13 NOTE — Telephone Encounter (Signed)
New message      *STAT* If patient is at the pharmacy, call can be transferred to refill team.   1. Which medications need to be refilled? (please list name of each medication and dose if known) atorvastatin (LIPITOR) 80 MG tablet  2. Which pharmacy/location (including street and city if local pharmacy) is medication to be sent to? [facility] [place]   3. Do they need a 30 day or 90 day supply? [place]

## 2021-05-14 ENCOUNTER — Encounter: Payer: Self-pay | Admitting: Cardiology

## 2021-05-14 ENCOUNTER — Other Ambulatory Visit: Payer: Self-pay

## 2021-05-14 ENCOUNTER — Telehealth (INDEPENDENT_AMBULATORY_CARE_PROVIDER_SITE_OTHER): Payer: Medicare Other | Admitting: Cardiology

## 2021-05-14 VITALS — BP 143/74 | HR 92 | Ht 60.0 in

## 2021-05-14 DIAGNOSIS — I480 Paroxysmal atrial fibrillation: Secondary | ICD-10-CM | POA: Diagnosis not present

## 2021-05-14 DIAGNOSIS — I5022 Chronic systolic (congestive) heart failure: Secondary | ICD-10-CM

## 2021-05-14 DIAGNOSIS — I2510 Atherosclerotic heart disease of native coronary artery without angina pectoris: Secondary | ICD-10-CM | POA: Diagnosis not present

## 2021-05-14 NOTE — Progress Notes (Signed)
Virtual Visit via Telephone Note   This visit type was conducted due to national recommendations for restrictions regarding the COVID-19 Pandemic (e.g. social distancing) in an effort to limit this patient's exposure and mitigate transmission in our community.  Due to her co-morbid illnesses, this patient is at least at moderate risk for complications without adequate follow up.  This format is felt to be most appropriate for this patient at this time.  The patient did not have access to video technology/had technical difficulties with video requiring transitioning to audio format only (telephone).  All issues noted in this document were discussed and addressed.  No physical exam could be performed with this format.  Please refer to the patient's chart for her  consent to telehealth for [facility].    Date:  05/14/2021   ID:  Stacey [name], DOB 01-21-1946, MRN [id number] The patient was identified using 2 identifiers.  Patient Location: Home Provider Location: Office/Clinic   PCP:  [facility] Providers Cardiologist:  Carlyle Dolly, MD {   Evaluation Performed:  Follow-Up Visit  Chief Complaint:  Follow up  History of Present Illness:    Stacey [name] is a 75 y.o. female seen today for follow up of the following medical problems.      1. Chronic systolic HF/ICM - Jan 1093 echo LVEF 20-25%, restrictive diastolic function, mild AS, PASP 40 - 10/2017 echo LVEF 50%, dropped to 30-35% Jan 2019 and has remained decreased   - chronic left leg edema thought to be venous insufficiency   From prior cardiologist: "She is not a candidate for device therapy given her metastatic cancer and numerous comorbidities and poor long-term prognosis. She has been scheduled to establish care with the palliative care team which I think is quite appropriate".  - has been hesitant to change to entresto due to concern about cost      03/2020 LVEF 35-40%  03/2021  echo LVEF 50-55%, grade I dd - no recent swelling, no SOB or DOE - compliant with meds   2. CAD - notes incidate history of DES to ramus, late stent thrombosis requiring PTCA in 2006. DES to RCA in Dec 2018 - SOB on brillinta, previously changed to plavix.    - no chest pains       3. Mild aortic stenosis - Jan 2020 echo mean grad 11, AVA VTI 1.69 - 2021 mild to mod AS  03/2021 echo mild moderate AS: mean grad 10.5, DI 0.31, AVA VTI 1.09   4. Chronic LBBB     5. . Metastatic breast cancer - followed at [facility]. Metastatic breast cancer to the bone and renal lesion, has pulmonary nodules. On radiation. - has been seen by palliative care previously - from 03/08/20 onc note increased size of renal lesion, to f/u with urology. Mention concern for additional primary such as renal cell CA.      6. Afib - off coumadin for intrathecal pain pump through palliative pain management at [facility]  - no recent palpitations        7. Recent lap chole 03/2021 - later required incarcerated umbilical hernia repair 01/3556     8. HTN - recent issues with high bp at home - home bp's 140s-150s  The patient does not have symptoms concerning for COVID-19 infection (fever, chills, cough, or new shortness of breath).    Past Medical History:  Diagnosis Date   CAD in native artery    a. DES to  ramus 2005 with late stent thrombosis 2006 tx with PTCA. 12/18 PCI/DES x1 to mRCA, EF 50-55%   CHF (congestive heart failure) (HCC)    Chronic pain    COPD (chronic obstructive pulmonary disease) (HCC)    Hyperlipidemia    Hypertension    Left bundle Siniya Lichty block    Lymphedema    Metastatic breast cancer ([place])    a. to bone.   MI (myocardial infarction) ([place])    Mild aortic stenosis 10/2017   Morbid obesity ([place])    PAF (paroxysmal atrial fibrillation) ([place])    PSVT (paroxysmal supraventricular tachycardia) ([place])    a. per [facility] notes, seen on event monitor in 2014.   Pulmonary nodules    Past  Surgical History:  Procedure Laterality Date   CHOLECYSTECTOMY N/A 03/29/2021   Procedure: LAPAROSCOPIC CHOLECYSTECTOMY;  Surgeon: [name], MD;  Location: [facility];  Service: General;  Laterality: N/A;   CORONARY STENT INTERVENTION N/A 11/26/2017   Procedure: CORONARY STENT INTERVENTION;  Surgeon: Martinique, Peter M, MD;  Location: [place] [facility];  Service: Cardiovascular;  Laterality: N/A;   CORONARY STENT PLACEMENT     ERCP N/A 03/28/2021   Procedure: ENDOSCOPIC RETROGRADE CHOLANGIOPANCREATOGRAPHY (ERCP);  Surgeon: Rogene Houston, MD;  Location: [facility];  Service: Endoscopy;  Laterality: N/A;   intrathecal pain pump     LEFT HEART CATH AND CORONARY ANGIOGRAPHY N/A 11/26/2017   Procedure: LEFT HEART CATH AND CORONARY ANGIOGRAPHY;  Surgeon: Martinique, Peter M, MD;  Location: [place] [facility];  Service: Cardiovascular;  Laterality: N/A;   REMOVAL OF STONES  03/28/2021   Procedure: REMOVAL OF STONES;  Surgeon: Rogene Houston, MD;  Location: [facility];  Service: Endoscopy;;   SPHINCTEROTOMY  03/28/2021   Procedure: SPHINCTEROTOMY;  Surgeon: Rogene Houston, MD;  Location: [facility];  Service: Endoscopy;;   UMBILICAL HERNIA REPAIR N/A 04/03/2021   Procedure: ADULT PRIMARY UMBILICAL HERNIA REPAIR;  Surgeon: [name], MD;  Location: [facility];  Service: General;  Laterality: N/A;   VASCULAR SURGERY       Current Meds  Medication Sig   acetaminophen (TYLENOL) 325 MG tablet Take 2 tablets (650 mg total) by mouth every 6 (six) hours as needed for mild pain, fever or headache.   aspirin EC 81 MG tablet Take 1 tablet (81 mg total) by mouth daily with breakfast. Swallow whole.   atorvastatin (LIPITOR) 80 MG tablet Take 1 tablet (80 mg total) by mouth daily.   b complex vitamins tablet Take 1 tablet by mouth every morning.   carboxymethylcellulose (REFRESH PLUS) 0.5 % SOLN Place 1 drop into both eyes in the morning, at noon, in the evening, and at bedtime.   clopidogrel (PLAVIX) 75 MG tablet  Take 75 mg by mouth daily.   diclofenac Sodium (VOLTAREN) 1 % GEL Apply 2 g topically 4 (four) times daily.   DULoxetine (CYMBALTA) 30 MG capsule Take 3 capsules (90 mg total) by mouth daily. (Patient taking differently: Take 30 mg by mouth daily.)   losartan (COZAAR) 100 MG tablet Take 100 mg by mouth daily.   metoprolol succinate (TOPROL-XL) 100 MG 24 hr tablet TAKE 1 AND 1/2 TABLETS DAILY (Patient taking differently: Take 150 mg by mouth daily.)   Multiple Vitamin (MULTIVITAMIN ADULT PO) Take 1 tablet by mouth daily.   Multiple Vitamins-Minerals (EQ VISION FORMULA 50+ PO) Take 1 tablet by mouth every morning.   nitroGLYCERIN (NITROSTAT) 0.4 MG SL tablet Place 1 tablet (0.4 mg total) under  the tongue every 5 (five) minutes x 3 doses as needed for chest pain (if no relief after 3rd dose, proceed to the ED or call 911).   ondansetron (ZOFRAN) 4 MG tablet Take 1 tablet (4 mg total) by mouth daily as needed for nausea or vomiting.   oxyCODONE (OXY IR/ROXICODONE) 5 MG immediate release tablet Take 5 mg by mouth every 6 (six) hours as needed for moderate pain.   PAIN MANAGEMENT INTRATHECAL, IT, PUMP by Intrathecal route.   polyethylene glycol powder (GLYCOLAX/MIRALAX) 17 GM/SCOOP powder Take 17 g by mouth daily as needed for moderate constipation.    pregabalin (LYRICA) 50 MG capsule Take 50 mg by mouth 3 (three) times daily.   senna-docusate (SENOKOT-S) 8.6-50 MG tablet 1 tablet once daily   [DISCONTINUED] losartan (COZAAR) 50 MG tablet Take 1 tablet by mouth daily.     Allergies:   Brilinta [ticagrelor], Budesonide, and Albuterol   Social History   Tobacco Use   Smoking status: Former    Packs/day: 2.00    Years: 18.00    Pack years: 36.00    Types: Cigarettes    Quit date: 12/01/1980    Years since quitting: 40.4   Smokeless tobacco: Never  Vaping Use   Vaping Use: Never used  Substance Use Topics   Alcohol use: No   Drug use: Yes    Types: Oxycodone, Morphine    Comment: takes  methadone     Family Hx: The patient's family history includes CAD in her paternal grandmother; CAD (age of onset: 79) in her father; COPD in her sister; Heart attack in her father. There is no history of Sudden Cardiac Death, Colon polyps, or Colon cancer.  ROS:   Please see the history of present illness.    All other systems reviewed and are negative.   Prior CV studies:   The following studies were reviewed today:   Labs/Other Tests and Data Reviewed:    EKG:  No ECG reviewed.  Recent Labs: 05/01/2021: TSH 1.425 05/10/2021: ALT 15; BUN 19; Creatinine, Ser 0.82; Hemoglobin 9.1; Magnesium 1.7; Platelets 223; Potassium 3.7; Sodium 140   Recent Lipid Panel No results found for: CHOL, TRIG, HDL, CHOLHDL, LDLCALC, LDLDIRECT  Wt Readings from Last 3 Encounters:  05/01/21 198 lb (89.8 kg)  03/31/21 204 lb 9.4 oz (92.8 kg)  03/11/21 195 lb (88.5 kg)     Risk Assessment/Calculations:     Objective:    Vital Signs:  BP (!) 143/74 Comment: thinks bp 143/?  Pulse 92   Ht 5' (1.524 m)   BMI 38.67 kg/m    Normal speech pattern and tone. Normal affect. No audible signs of sob or wheezing.   ASSESSMENT & PLAN:    1. Chronic systolic HF - LVEF has normalized, no recent symptoms. Continue curren tmeds   2. CAD - on extended DAPT due to history of late stent thrombosis - we will continue current meds   3. PAF -off coumadin since intrathecal pain pump placement at [facility], contraindicated with pump in place - no recent symptoms  4. HTN - elevated today, she will call with bp's later in the week. Consider amlodopine vs aldactone pending bp's.       COVID-19 Education: The signs and symptoms of COVID-19 were discussed with the patient and how to seek care for testing (follow up with PCP or arrange E-visit).  The importance of social distancing was discussed today.  Time:   Today, I have spent 18 minutes with  the patient with telehealth technology discussing the above  problems.     Medication Adjustments/Labs and Tests Ordered: Current medicines are reviewed at length with the patient today.  Concerns regarding medicines are outlined above.   Tests Ordered: No orders of the defined types were placed in this encounter.   Medication Changes: No orders of the defined types were placed in this encounter.   Follow Up:  In Person 6 months  Signed, Carlyle Dolly, MD  05/14/2021 2:11 PM    [place]

## 2021-05-14 NOTE — Patient Instructions (Signed)
Medication Instructions:  Your physician recommends that you continue on your current medications as directed. Please refer to the Current Medication list given to you today.  *If you need a refill on your cardiac medications before your next appointment, please call your pharmacy*   Lab Work: None today  If you have labs (blood work) drawn today and your tests are completely normal, you will receive your results only by: [place] (if you have MyChart) OR A paper copy in the mail If you have any lab test that is abnormal or we need to change your treatment, we will call you to review the results.   Testing/Procedures: None today    Follow-Up: At [facility], you and your health needs are our priority.  As part of our continuing mission to provide you with exceptional heart care, we have created designated Provider Care Teams.  These Care Teams include your primary Cardiologist (physician) and Advanced Practice Providers (APPs -  Physician Assistants and Nurse Practitioners) who all work together to provide you with the care you need, when you need it.  We recommend signing up for the patient portal called "MyChart".  Sign up information is provided on this After Visit Summary.  MyChart is used to connect with patients for Virtual Visits (Telemedicine).  Patients are able to view lab/test results, encounter notes, upcoming appointments, etc.  Non-urgent messages can be sent to your provider as well.   To learn more about what you can do with MyChart, go to [email or web address].    Your next appointment:   6 month(s)  The format for your next appointment:   In Person  Provider:   Carlyle Dolly, MD   Other Instructions Call back Friday ( 05/17/21) with home blood pressures

## 2021-05-17 ENCOUNTER — Telehealth: Payer: Self-pay | Admitting: Cardiology

## 2021-05-17 NOTE — Telephone Encounter (Signed)
Patient called stating that on her Telemedicine visit 6/14 Dr. Harl Bowie told her that he was going to call in a new prescription. RX has not been called in.

## 2021-05-17 NOTE — Telephone Encounter (Signed)
Left message to return call 

## 2021-05-17 NOTE — Telephone Encounter (Signed)
Informed patient that no medications were started at that visit.  Provider did request a call back in regards to bp readings since hers was a little elevated at time of telehealth visit.    Per dictation - 4. HTN - elevated today, she will call with bp's later in the week. Consider amlodopine vs aldactone pending bp's  They will call back next week with readings.

## 2021-05-20 ENCOUNTER — Telehealth: Payer: Self-pay | Admitting: Cardiology

## 2021-05-20 NOTE — Telephone Encounter (Signed)
New patient    Pt c/o BP issue: STAT if pt c/o blurred vision, one-sided weakness or slurred speech  1. What are your last 5 BP readings? Wed 133/68 thurs 140/70 Saturday 153/72 Sunday 156/76   2. Are you having any other symptoms (ex. Dizziness, headache, blurred vision, passed out)? no  Patient reporting her bp readings

## 2021-05-21 MED ORDER — AMLODIPINE BESYLATE 5 MG PO TABS: 5.0000 mg | ORAL_TABLET | Freq: Every day | ORAL | 1 refills | Status: DC

## 2021-05-21 NOTE — Telephone Encounter (Signed)
Patient informed and verbalized understanding of plan. 

## 2021-05-21 NOTE — Telephone Encounter (Signed)
BP's are too high, I would recommend starting norvasc 5mg . Daily  Zandra Abts MD

## 2021-06-05 ENCOUNTER — Other Ambulatory Visit (HOSPITAL_COMMUNITY): Payer: Medicare Other

## 2021-06-05 ENCOUNTER — Ambulatory Visit (HOSPITAL_COMMUNITY): Payer: Medicare Other | Admitting: Hematology and Oncology

## 2021-06-05 ENCOUNTER — Ambulatory Visit (HOSPITAL_COMMUNITY): Payer: Medicare Other

## 2021-06-13 ENCOUNTER — Ambulatory Visit (HOSPITAL_COMMUNITY): Payer: Medicare Other

## 2021-06-13 ENCOUNTER — Ambulatory Visit (HOSPITAL_COMMUNITY): Payer: Medicare Other | Admitting: Hematology

## 2021-06-16 ENCOUNTER — Other Ambulatory Visit: Payer: Self-pay | Admitting: Cardiology

## 2021-06-18 ENCOUNTER — Other Ambulatory Visit: Payer: Self-pay

## 2021-06-18 MED ORDER — AMLODIPINE BESYLATE 5 MG PO TABS: 5.0000 mg | ORAL_TABLET | Freq: Every day | ORAL | 2 refills | Status: DC

## 2021-06-19 ENCOUNTER — Other Ambulatory Visit: Payer: Self-pay | Admitting: Cardiology

## 2021-06-21 ENCOUNTER — Telehealth: Payer: Self-pay | Admitting: Cardiology

## 2021-06-21 NOTE — Telephone Encounter (Signed)
Left message to return call. Patient needs to discuss this with pcp as this is not a cardiac drug.  Patient called back and will discuss with pcp.

## 2021-06-21 NOTE — Telephone Encounter (Signed)
New message     DULoxetine (CYMBALTA) 30 MG capsule can the patient take this spread out through the day instead of taking all 3 at one time ?

## 2021-06-28 ENCOUNTER — Other Ambulatory Visit: Payer: Self-pay

## 2021-06-28 ENCOUNTER — Inpatient Hospital Stay (HOSPITAL_COMMUNITY): Payer: Medicare Other

## 2021-06-28 ENCOUNTER — Inpatient Hospital Stay (HOSPITAL_COMMUNITY): Payer: Medicare Other | Attending: Hematology | Admitting: Hematology and Oncology

## 2021-06-28 ENCOUNTER — Encounter (HOSPITAL_COMMUNITY): Payer: Self-pay | Admitting: Hematology and Oncology

## 2021-06-28 DIAGNOSIS — Z170 Estrogen receptor positive status [ER+]: Secondary | ICD-10-CM

## 2021-06-28 DIAGNOSIS — C7951 Secondary malignant neoplasm of bone: Secondary | ICD-10-CM | POA: Insufficient documentation

## 2021-06-28 DIAGNOSIS — C50911 Malignant neoplasm of unspecified site of right female breast: Secondary | ICD-10-CM | POA: Diagnosis not present

## 2021-06-28 DIAGNOSIS — C50411 Malignant neoplasm of upper-outer quadrant of right female breast: Secondary | ICD-10-CM | POA: Diagnosis present

## 2021-06-28 DIAGNOSIS — Z5111 Encounter for antineoplastic chemotherapy: Secondary | ICD-10-CM | POA: Insufficient documentation

## 2021-06-28 DIAGNOSIS — C773 Secondary and unspecified malignant neoplasm of axilla and upper limb lymph nodes: Secondary | ICD-10-CM | POA: Diagnosis not present

## 2021-06-28 MED ORDER — DOXYCYCLINE HYCLATE 100 MG PO TABS: 100.0000 mg | ORAL_TABLET | Freq: Two times a day (BID) | ORAL | 0 refills | Status: DC

## 2021-06-28 MED ORDER — FULVESTRANT 250 MG/5ML IM SOLN
500.0000 mg | Freq: Once | INTRAMUSCULAR | Status: AC
  Administered 2021-06-28: 500 mg via INTRAMUSCULAR

## 2021-06-28 MED ORDER — FULVESTRANT 250 MG/5ML IM SOLN
INTRAMUSCULAR | Status: AC
  Filled 2021-06-28: qty 10

## 2021-06-28 NOTE — Patient Instructions (Signed)
[facility]  Discharge Instructions: Thank you for choosing [facility] to provide your oncology and hematology care.  If you have a lab appointment with the Cancer Center, please come in thru the Main Entrance and check in at the main information desk.  Wear comfortable clothing and clothing appropriate for easy access to any Portacath or PICC line.   We strive to give you quality time with your provider. You may need to reschedule your appointment if you arrive late (15 or more minutes).  Arriving late affects you and other patients whose appointments are after yours.  Also, if you miss three or more appointments without notifying the office, you may be dismissed from the clinic at the provider's discretion.      For prescription refill requests, have your pharmacy contact our office and allow 72 hours for refills to be completed.    Today you received the following chemotherapy and/or immunotherapy agents Faslodex injection      To help prevent nausea and vomiting after your treatment, we encourage you to take your nausea medication as directed.  BELOW ARE SYMPTOMS THAT SHOULD BE REPORTED IMMEDIATELY: *FEVER GREATER THAN 100.4 F (38 C) OR HIGHER *CHILLS OR SWEATING *NAUSEA AND VOMITING THAT IS NOT CONTROLLED WITH YOUR NAUSEA MEDICATION *UNUSUAL SHORTNESS OF BREATH *UNUSUAL BRUISING OR BLEEDING *URINARY PROBLEMS (pain or burning when urinating, or frequent urination) *BOWEL PROBLEMS (unusual diarrhea, constipation, pain near the anus) TENDERNESS IN MOUTH AND THROAT WITH OR WITHOUT PRESENCE OF ULCERS (sore throat, sores in mouth, or a toothache) UNUSUAL RASH, SWELLING OR PAIN  UNUSUAL VAGINAL DISCHARGE OR ITCHING   Items with * indicate a potential emergency and should be followed up as soon as possible or go to the Emergency Department if any problems should occur.  Please show the CHEMOTHERAPY ALERT CARD or IMMUNOTHERAPY ALERT CARD at check-in to the  Emergency Department and triage nurse.  Should you have questions after your visit or need to cancel or reschedule your appointment, please contact [facility] [phone]  and follow the prompts.  Office hours are 8:00 a.m. to 4:30 p.m. Monday - Friday. Please note that voicemails left after 4:00 p.m. may not be returned until the following business day.  We are closed weekends and major holidays. You have access to a nurse at all times for urgent questions. Please call the main number to the clinic [phone] and follow the prompts.  For any non-urgent questions, you may also contact your provider using MyChart. We now offer e-Visits for anyone 18 and older to request care online for non-urgent symptoms. For details visit [email or web address].   Also download the MyChart app! Go to the app store, search "MyChart", open the app, select [facility], and log in with your MyChart username and password.  Due to Covid, a mask is required upon entering the hospital/clinic. If you do not have a mask, one will be given to you upon arrival. For doctor visits, patients may have 1 support person aged 18 or older with them. For treatment visits, patients cannot have anyone with them due to current Covid guidelines and our immunocompromised population.  

## 2021-06-28 NOTE — Addendum Note (Signed)
Addended by: Adaline Sill on: 06/28/2021 05:27 PM   Modules accepted: Orders

## 2021-06-28 NOTE — Progress Notes (Signed)
Patient presents today for Faslodex injection per providers order.  Patient complains of a new lump to her right breast and a cyst to her left axilla.  Advised the patient to share this with the doctor during the office visit.  Stable during administration without incident; injection site WNL; see MAR for injection details.  Patient tolerated procedure well and without incident.  No questions or complaints noted at this time. Discharge from clinic via EMS in stable condition.  Alert and oriented X 3.  Follow up with [facility] as scheduled.

## 2021-06-28 NOTE — Progress Notes (Signed)
[facility] [address], [place] [address]   Patient Care Team: Sela Hilding as PCP - General (Internal Medicine) Harl Bowie Alphonse Guild, MD as PCP - Cardiology (Cardiology) Brien Mates, RN as Oncology Nurse Navigator (Oncology)  SUMMARY OF ONCOLOGIC HISTORY: Oncology History  Metastatic breast cancer ([facility])  11/23/2017 Initial Diagnosis   Metastatic breast cancer ([facility])   02/07/2021 Cancer Staging   Staging form: Breast, AJCC 8th Edition - Clinical stage from 02/07/2021: Stage IV (cT2, cN1, pM1, G1, ER+, PR+, HER2: Equivocal) - Signed by Derek Jack, MD on 02/07/2021 Histopathologic type: Infiltrating duct carcinoma, NOS Stage prefix: Initial diagnosis Histologic grading system: 3 grade system   02/07/2021 - 02/07/2021 Chemotherapy           CHIEF COMPLIANT: Follow-up for 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 is a 75 y.o. 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  She is here for her scheduled Faslodex injection.  Today she complains that her right breast mass is progressively getting larger.  She also noticed a left axillary boil which is oozing some pus. She says her hip pain and back pain are a bit out of control.  She will be following up with him for pain management soon.  REVIEW OF SYSTEMS:   Review of Systems  Constitutional:  Positive for fatigue (50%). Negative for appetite change.  Respiratory:  Positive for shortness of breath.   Cardiovascular:  Positive for chest pain (soreness under R breast).  Gastrointestinal:  Positive for constipation.  Musculoskeletal:  Positive for arthralgias (10/10 R leg pain).  Neurological:  Positive for dizziness (when turning quickly) and numbness.  Psychiatric/Behavioral:  Positive for sleep disturbance.   All other systems reviewed and are negative.  I have reviewed the past medical history, past surgical  history, social history and family history with the patient and they are unchanged from previous note.   ALLERGIES:   is allergic to brilinta [ticagrelor], budesonide, and albuterol.   MEDICATIONS:  Current Outpatient Medications  Medication Sig Dispense Refill   amLODipine (NORVASC) 5 MG tablet Take 1 tablet (5 mg total) by mouth daily. 90 tablet 2   aspirin EC 81 MG tablet Take 1 tablet (81 mg total) by mouth daily with breakfast. Swallow whole. 30 tablet 11   atorvastatin (LIPITOR) 80 MG tablet Take 1 tablet by mouth once daily 90 tablet 1   b complex vitamins tablet Take 1 tablet by mouth every morning. 30 tablet 0   carboxymethylcellulose (REFRESH PLUS) 0.5 % SOLN Place 1 drop into both eyes in the morning, at noon, in the evening, and at bedtime.     clopidogrel (PLAVIX) 75 MG tablet Take 75 mg by mouth daily.     diclofenac Sodium (VOLTAREN) 1 % GEL Apply 2 g topically 4 (four) times daily.     DULoxetine (CYMBALTA) 30 MG capsule Take 3 capsules (90 mg total) by mouth daily. (Patient taking differently: Take 30 mg by mouth daily.) 30 capsule 0   losartan (COZAAR) 100 MG tablet Take 100 mg by mouth daily.     metoprolol succinate (TOPROL-XL) 100 MG 24 hr tablet TAKE 1 AND 1/2 TABLETS DAILY (DOSE INCREASE) 135 tablet 2   Multiple Vitamin (MULTIVITAMIN ADULT PO) Take 1 tablet by mouth daily.     Multiple Vitamins-Minerals (EQ VISION FORMULA 50+ PO) Take 1 tablet by mouth every morning.  PAIN MANAGEMENT INTRATHECAL, IT, PUMP by Intrathecal route.     polyethylene glycol powder (GLYCOLAX/MIRALAX) 17 GM/SCOOP powder Take 17 g by mouth daily as needed for moderate constipation.      pregabalin (LYRICA) 50 MG capsule Take 50 mg by mouth 3 (three) times daily.     senna-docusate (SENOKOT-S) 8.6-50 MG tablet 1 tablet once daily     acetaminophen (TYLENOL) 325 MG tablet Take 2 tablets (650 mg total) by mouth every 6 (six) hours as needed for mild pain, fever or headache. (Patient not taking:  Reported on 06/28/2021) 12 tablet 0   nitroGLYCERIN (NITROSTAT) 0.4 MG SL tablet Place 1 tablet (0.4 mg total) under the tongue every 5 (five) minutes x 3 doses as needed for chest pain (if no relief after 3rd dose, proceed to the ED or call 911). (Patient not taking: Reported on 06/28/2021) 25 tablet 3   ondansetron (ZOFRAN) 4 MG tablet Take 1 tablet (4 mg total) by mouth daily as needed for nausea or vomiting. (Patient not taking: Reported on 06/28/2021) 20 tablet 0   oxyCODONE (OXY IR/ROXICODONE) 5 MG immediate release tablet Take 5 mg by mouth every 6 (six) hours as needed for moderate pain. (Patient not taking: Reported on 06/28/2021)     No current facility-administered medications for this visit.     PHYSICAL EXAMINATION: Performance status (ECOG): 4 - Bedbound  There were no vitals filed for this visit.  Wt Readings from Last 3 Encounters:  05/01/21 198 lb (89.8 kg)  03/31/21 204 lb 9.4 oz (92.8 kg)  03/11/21 195 lb (88.5 kg)   Physical Exam Vitals reviewed.  Constitutional:      Appearance: Normal appearance. She is obese.     Comments: Bedbound  Chest:  Breasts:    Right: Mass (UOQ, I have noticed a breast mass today in the right upper quadrant but I cannot quite compare since this is my first visit with her.) and tenderness present.  Skin:    Findings: Lesion (There is a boil with central purulence in the left axilla noted today, suggestive of possibly folliculitis and some surrounding skin erythema.) present.  Neurological:     General: No focal deficit present.     Mental Status: She is alert and oriented to person, place, and time.  Psychiatric:        Mood and Affect: Mood normal.        Behavior: Behavior normal.      LABORATORY DATA:  I have reviewed the data as listed CMP Latest Ref Rng & Units 05/10/2021 05/01/2021 04/04/2021  Glucose 70 - 99 mg/dL 121(H) 98 91  BUN 8 - 23 mg/dL '19 20 8  ' Creatinine 0.44 - 1.00 mg/dL 0.82 0.87 0.65  Sodium 135 - 145 mmol/L 140  140 139  Potassium 3.5 - 5.1 mmol/L 3.7 4.1 4.8  Chloride 98 - 111 mmol/L 99 100 100  CO2 22 - 32 mmol/L 35(H) 31 33(H)  Calcium 8.9 - 10.3 mg/dL 10.6(H) 11.8(H) 9.2  Total Protein 6.5 - 8.1 g/dL 5.7(L) 6.0(L) 5.2(L)  Total Bilirubin 0.3 - 1.2 mg/dL 0.9 1.5(H) 0.9  Alkaline Phos 38 - 126 U/L 77 70 60  AST 15 - 41 U/L '19 20 18  ' ALT 0 - 44 U/L '15 12 19   ' Lab Results  Component Value Date   CAN153 17.1 02/07/2021   Lab Results  Component Value Date   WBC 4.4 05/10/2021   HGB 9.1 (L) 05/10/2021   HCT 27.1 (L) 05/10/2021  MCV 100.4 (H) 05/10/2021   PLT 223 05/10/2021   NEUTROABS 2.7 05/10/2021    ASSESSMENT:  1.  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 -Ultrasound-guided biopsy on 01/07/2016 showed grade 1 invasive ductal carcinoma, ER positive, PR positive, HER-2 equivocal by FISH, right axillary lymph node with metastatic adenocarcinoma.  HER-2 equivocal since her 2/CEP17 ratio less than 2 and average copy number of her 2 is greater than or equal to 4 but less than 6 signals per cell. -Bone biopsy on 05/30/2016 consistent with metastatic breast cancer. -Palliative therapy with letrozole started under the direction of Dr. Deitra Mayo at [facility] from 02/25/2016 through February 2020, progressive symptoms with increased soft tissue extension at L2. 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 -Addition of Leslee Home was discussed but she was concerned about cost of the medication. -CT abdomen and pelvis on 08/13/2020 shows acute compression fracture deformity of the L1 vertebral body and other chronic findings. -Her last Faslodex injection at [facility] was on 11/06/2020. -CT CAP on 02/19/2021 with no evidence of for solid organ metastasis or  nodal metastasis.  Right breast lesion is stable.  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 -Bone scan on 02/19/2021 was negative for bone mets.   2.  Social/family history: -She worked for [employer] prior to retirement in 2010. -She is currently living with her son who is helping with her day-to-day activities.  Daughter lives in [place].  She quit smoking 35 to 40 years ago. -She is mostly confined to bed.  However she is able to change clothes, able to wash upper body, halfway dress herself.  She cannot stand up for the past 2 years because of weakness in the legs and knee arthritis.  She wears adult diapers.   PLAN:   1.  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  In the past Dr. Delton Coombes has suggested adding Leslee Home but patient was worried about expense hence she only continued on Faslodex. -Last images of the CT CAP from 02/19/2021 which showed stable appearance of the tumor in the right breast with no new findings suggest nodule or solid organ metastasis.  She however now complains of increasing right breast mass and she is due for repeat imaging, this has been ordered. -She will receive her Faslodex this week.  RTC in a couple weeks for follow-up with Dr. Raliegh Ip to review imaging findings and to discuss any additional recommendations.  2.  Cancer associated pain: -She has compression fracture of L1/L2. -She follows up with pain clinic at [facility].  She has intrathecal pump which has morphine. -Continue oxycodone 5 mg 3-4 times daily as needed for breakthrough pain. -Continue duloxetine 90 mg daily and Lyrica 50 mg 3 times daily. -She says the pain is uncontrolled in her right hip and she has a follow-up with the [facility] pain clinic for pain management  3.  Folliculitis with some purulence and some surrounding skin erythema. She is not allergic to any antibiotics we  will try doxycycline 100 mg twice a day for 7 days.  4.  I have noticed that her calcium numbers are slowly trending up, hence have asked to repeat  labs in the next couple weeks when she comes back to do her CT imaging.  In the meantime I have advised hydration as well as holding off on calcium supplementation.  She needs to follow-up with Dr. Raliegh Ip to review repeat imaging in the next 2 or 3 weeks and she is presently concerned about progression.  Benay Pike MD

## 2021-07-01 ENCOUNTER — Telehealth (HOSPITAL_COMMUNITY): Payer: Self-pay | Admitting: Hematology and Oncology

## 2021-07-01 NOTE — Telephone Encounter (Signed)
Called patient to advise provider requested scans and follow up sooner, but patient declined.  Per patient, she does not want to go to [facility].  She wants to wait and have both CT and nuc med bone scan here at [facility] on same day.  Patient was offered CT for an earlier date, but declined it as well due to the arrival time.

## 2021-07-02 ENCOUNTER — Other Ambulatory Visit: Payer: Self-pay | Admitting: Cardiology

## 2021-07-04 ENCOUNTER — Other Ambulatory Visit: Payer: Self-pay

## 2021-07-04 MED ORDER — NITROGLYCERIN 0.4 MG SL SUBL
0.4000 mg | SUBLINGUAL_TABLET | SUBLINGUAL | 3 refills | Status: DC | PRN
Start: 2021-07-04 — End: 2021-07-11

## 2021-07-04 MED ORDER — LOSARTAN POTASSIUM 100 MG PO TABS: 100.0000 mg | ORAL_TABLET | Freq: Every day | ORAL | 3 refills | Status: DC

## 2021-07-11 ENCOUNTER — Telehealth: Payer: Self-pay

## 2021-07-11 MED ORDER — LOSARTAN POTASSIUM 100 MG PO TABS
100.0000 mg | ORAL_TABLET | Freq: Every day | ORAL | 3 refills | Status: DC
Start: 2021-07-11 — End: 2022-09-10

## 2021-07-11 MED ORDER — NITROGLYCERIN 0.4 MG SL SUBL: 0.4000 mg | SUBLINGUAL_TABLET | SUBLINGUAL | 3 refills | Status: DC | PRN

## 2021-07-11 NOTE — Telephone Encounter (Signed)
Medication refill request approved for Losartan 100 mg tablets and SL NTG 0.4 mg tablets. Prescriptions sent to [facility].

## 2021-07-26 ENCOUNTER — Inpatient Hospital Stay (HOSPITAL_COMMUNITY): Payer: Medicare Other

## 2021-07-26 ENCOUNTER — Ambulatory Visit (HOSPITAL_COMMUNITY): Payer: Medicare Other

## 2021-07-26 ENCOUNTER — Other Ambulatory Visit: Payer: Self-pay

## 2021-07-26 ENCOUNTER — Encounter (HOSPITAL_COMMUNITY): Payer: Self-pay

## 2021-07-26 VITALS — BP 161/74 | HR 75 | Temp 97.7°F | Resp 18

## 2021-07-26 DIAGNOSIS — Z5111 Encounter for antineoplastic chemotherapy: Secondary | ICD-10-CM | POA: Insufficient documentation

## 2021-07-26 DIAGNOSIS — C7951 Secondary malignant neoplasm of bone: Secondary | ICD-10-CM | POA: Insufficient documentation

## 2021-07-26 DIAGNOSIS — Z170 Estrogen receptor positive status [ER+]: Secondary | ICD-10-CM | POA: Insufficient documentation

## 2021-07-26 DIAGNOSIS — C50911 Malignant neoplasm of unspecified site of right female breast: Secondary | ICD-10-CM

## 2021-07-26 LAB — COMPREHENSIVE METABOLIC PANEL
ALT: 13 U/L (ref 0–44)
AST: 19 U/L (ref 15–41)
Albumin: 3.2 g/dL — ABNORMAL LOW (ref 3.5–5.0)
Alkaline Phosphatase: 52 U/L (ref 38–126)
Anion gap: 4 — ABNORMAL LOW (ref 5–15)
BUN: 25 mg/dL — ABNORMAL HIGH (ref 8–23)
CO2: 35 mmol/L — ABNORMAL HIGH (ref 22–32)
Calcium: 12 mg/dL — ABNORMAL HIGH (ref 8.9–10.3)
Chloride: 100 mmol/L (ref 98–111)
Creatinine, Ser: 1.19 mg/dL — ABNORMAL HIGH (ref 0.44–1.00)
GFR, Estimated: 48 mL/min — ABNORMAL LOW (ref >60)
Glucose, Bld: 117 mg/dL — ABNORMAL HIGH (ref 70–99)
Potassium: 3.7 mmol/L (ref 3.5–5.1)
Sodium: 139 mmol/L (ref 135–145)
Total Bilirubin: 0.8 mg/dL (ref 0.3–1.2)
Total Protein: 5.8 g/dL — ABNORMAL LOW (ref 6.5–8.1)

## 2021-07-26 LAB — CBC WITH DIFFERENTIAL/PLATELET
Abs Immature Granulocytes: 0.01 10*3/uL (ref 0.00–0.07)
Basophils Absolute: 0 10*3/uL (ref 0.0–0.1)
Basophils Relative: 1 %
Eosinophils Absolute: 0.3 10*3/uL (ref 0.0–0.5)
Eosinophils Relative: 6 %
HCT: 24.8 % — ABNORMAL LOW (ref 36.0–46.0)
Hemoglobin: 8.5 g/dL — ABNORMAL LOW (ref 12.0–15.0)
Immature Granulocytes: 0 %
Lymphocytes Relative: 26 %
Lymphs Abs: 1.2 10*3/uL (ref 0.7–4.0)
MCH: 34.6 pg — ABNORMAL HIGH (ref 26.0–34.0)
MCHC: 34.3 g/dL (ref 30.0–36.0)
MCV: 100.8 fL — ABNORMAL HIGH (ref 80.0–100.0)
Monocytes Absolute: 0.3 10*3/uL (ref 0.1–1.0)
Monocytes Relative: 7 %
Neutro Abs: 2.7 10*3/uL (ref 1.7–7.7)
Neutrophils Relative %: 60 %
Platelets: 198 10*3/uL (ref 150–400)
RBC: 2.46 MIL/uL — ABNORMAL LOW (ref 3.87–5.11)
RDW: 13.4 % (ref 11.5–15.5)
WBC: 4.5 10*3/uL (ref 4.0–10.5)
nRBC: 0 % (ref 0.0–0.2)

## 2021-07-26 MED ORDER — FULVESTRANT 250 MG/5ML IM SOLN
500.0000 mg | Freq: Once | INTRAMUSCULAR | Status: AC
  Administered 2021-07-26: 500 mg via INTRAMUSCULAR
  Filled 2021-07-26: qty 10

## 2021-07-26 NOTE — Progress Notes (Signed)
Patient presents today for lab work for upcoming ct and Faslodex injection. Patient arrived by [facility] services. Vital signs stable. Patient has no complaints of any changes since her last visit. MAR reviewed and updated. Labs drawn and sent. Schedule printed and highlighted for patient's upcoming appointments. Understanding verbalized by patient. Pharmacist, community and give to [facility] EMS transporter.   Treatment given today per MD orders. Tolerated without adverse affects. Vital signs stable. No complaints at this time. Discharged from clinic via stretcher by [facility] in stable condition. Alert and oriented x 3. F/U with [facility] as scheduled.

## 2021-07-26 NOTE — Patient Instructions (Signed)
[facility]  Discharge Instructions: Thank you for choosing [facility] to provide your oncology and hematology care.  If you have a lab appointment with the [facility], please come in thru the Main Entrance and check in at the main information desk.  Wear comfortable clothing and clothing appropriate for easy access to any Portacath or PICC line.   We strive to give you quality time with your provider. You may need to reschedule your appointment if you arrive late (15 or more minutes).  Arriving late affects you and other patients whose appointments are after yours.  Also, if you miss three or more appointments without notifying the office, you may be dismissed from the clinic at the provider's discretion.      For prescription refill requests, have your pharmacy contact our office and allow 72 hours for refills to be completed.    Today you received the Faslodex injection.      To help prevent nausea and vomiting after your treatment, we encourage you to take your nausea medication as directed.  BELOW ARE SYMPTOMS THAT SHOULD BE REPORTED IMMEDIATELY: *FEVER GREATER THAN 100.4 F (38 C) OR HIGHER *CHILLS OR SWEATING *NAUSEA AND VOMITING THAT IS NOT CONTROLLED WITH YOUR NAUSEA MEDICATION *UNUSUAL SHORTNESS OF BREATH *UNUSUAL BRUISING OR BLEEDING *URINARY PROBLEMS (pain or burning when urinating, or frequent urination) *BOWEL PROBLEMS (unusual diarrhea, constipation, pain near the anus) TENDERNESS IN MOUTH AND THROAT WITH OR WITHOUT PRESENCE OF ULCERS (sore throat, sores in mouth, or a toothache) UNUSUAL RASH, SWELLING OR PAIN  UNUSUAL VAGINAL DISCHARGE OR ITCHING   Items with * indicate a potential emergency and should be followed up as soon as possible or go to the Emergency Department if any problems should occur.  Please show the CHEMOTHERAPY ALERT CARD or IMMUNOTHERAPY ALERT CARD at check-in to the Emergency Department and triage nurse.  Should you have  questions after your visit or need to cancel or reschedule your appointment, please contact [facility] [phone]  and follow the prompts.  Office hours are 8:00 a.m. to 4:30 p.m. Monday - Friday. Please note that voicemails left after 4:00 p.m. may not be returned until the following business day.  We are closed weekends and major holidays. You have access to a nurse at all times for urgent questions. Please call the main number to the clinic [phone] and follow the prompts.  For any non-urgent questions, you may also contact your provider using MyChart. We now offer e-Visits for anyone 58 and older to request care online for non-urgent symptoms. For details visit [email or web address].   Also download the MyChart app! Go to the app store, search "MyChart", open the app, select [facility], and log in with your MyChart username and password.  Due to Covid, a mask is required upon entering the hospital/clinic. If you do not have a mask, one will be given to you upon arrival. For doctor visits, patients may have 1 support person aged 76 or older with them. For treatment visits, patients cannot have anyone with them due to current Covid guidelines and our immunocompromised population.

## 2021-07-30 NOTE — Progress Notes (Signed)
,  [place], [place] [address]   CLINIC:  Medical Oncology/Hematology  PCP:  Sela Hilding [address] 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 Deitra Mayo at [facility] from 02/25/2016 through February 2020  NGS Results: not done  CURRENT THERAPY: Faslodex and Zometa  BRIEF ONCOLOGIC HISTORY:  Oncology History  Metastatic breast cancer ([facility])  11/23/2017 Initial Diagnosis   Metastatic breast cancer ([facility])   02/07/2021 Cancer Staging   Staging form: Breast, AJCC 8th Edition - Clinical stage from 02/07/2021: Stage IV (cT2, cN1, pM1, G1, ER+, PR+, HER2: Equivocal) - Signed by Derek Jack, MD on 02/07/2021 Histopathologic type: Infiltrating duct carcinoma, NOS Stage prefix: Initial diagnosis Histologic grading system: 3 grade system   02/07/2021 - 02/07/2021 Chemotherapy            CANCER STAGING: Cancer Staging Metastatic breast cancer [facility]) Staging form: Breast, AJCC 8th Edition - Clinical stage from 02/07/2021: Stage IV (cT2, cN1, pM1, G1, ER+, PR+, HER2: Equivocal) - Signed by Derek Jack, MD on 02/07/2021   INTERVAL HISTORY:  [name], a 75 y.o. 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 [place] was last seen on 02/26/2021.   Today she report feeling okay. She reports a lump on her upper right chest. She denies any current dental issues. She currently lives at home with her son. She reports severe pain in her right hip and leg.   REVIEW OF SYSTEMS:  Review of Systems  Constitutional:  Positive for fatigue (40%). Negative for appetite change (75%).  Respiratory:  Positive for cough and shortness of breath.   Gastrointestinal:  Positive for constipation.  Musculoskeletal:  Positive for  arthralgias (10/10 R hip), back pain (10/10) and myalgias (10/10 R leg).  Neurological:  Positive for numbness (L leg).  Psychiatric/Behavioral:  Positive for sleep disturbance (falling and staying asleep).   All other systems reviewed and are negative.  PAST MEDICAL/SURGICAL HISTORY:  Past Medical History:  Diagnosis Date   CAD in native artery    a. DES to ramus 2005 with late stent thrombosis 2006 tx with PTCA. 12/18 PCI/DES x1 to mRCA, EF 50-55%   CHF (congestive heart failure) (HCC)    Chronic pain    COPD (chronic obstructive pulmonary disease) (H[facility])    Hyperlipidemia    Hypertension    Left bundle branch block    Lymphedema    Metastatic breast cancer ([place])    a. to bone.   MI (myocardial infarction) ([facility])    Mild aortic stenosis 10/2017   Morbid obesity ([facility])    PAF (paroxysmal atrial fibrillation) ([facility])    PSVT (paroxysmal supraventricular tachycardia) ([facility])    a. per [facility] notes, seen on event monitor in 2014.   Pulmonary nodules    Past Surgical History:  Procedure Laterality Date   CHOLECYSTECTOMY N/A 03/29/2021   Procedure: LAPAROSCOPIC CHOLECYSTECTOMY;  Surgeon: [name], MD;  Location: [facility];  Service: General;  Laterality: N/A;   CORONARY STENT INTERVENTION N/A 11/26/2017   Procedure: CORONARY STENT INTERVENTION;  Surgeon: Martinique, Peter M, MD;  Location: [place] [facility] CV [facility];  Service: Cardiovascular;  Laterality: N/A;   CORONARY STENT PLACEMENT     ERCP N/A 03/28/2021   Procedure: ENDOSCOPIC RETROGRADE CHOLANGIOPANCREATOGRAPHY (ERCP);  Surgeon: Rogene Houston, MD;  Location: [facility];  Service: Endoscopy;  Laterality: N/A;   intrathecal pain pump     LEFT HEART CATH AND CORONARY ANGIOGRAPHY N/A 11/26/2017   Procedure: LEFT HEART CATH AND CORONARY ANGIOGRAPHY;  Surgeon: Martinique, Peter M, MD;  Location: [place] CV LAB;  Service: Cardiovascular;  Laterality: N/A;   REMOVAL OF STONES  03/28/2021   Procedure: REMOVAL OF STONES;  Surgeon: Rogene Houston,  MD;  Location: [facility];  Service: Endoscopy;;   SPHINCTEROTOMY  03/28/2021   Procedure: SPHINCTEROTOMY;  Surgeon: Rogene Houston, MD;  Location: [facility];  Service: Endoscopy;;   UMBILICAL HERNIA REPAIR N/A 04/03/2021   Procedure: ADULT PRIMARY UMBILICAL HERNIA REPAIR;  Surgeon: [name], MD;  Location: [facility];  Service: General;  Laterality: N/A;   VASCULAR SURGERY      SOCIAL HISTORY:  Social History   Socioeconomic History   Marital status: Single    Spouse name: Not on file   Number of children: Not on file   Years of education: Not on file   Highest education level: Not on file  Occupational History   Occupation: Retired  Tobacco Use   Smoking status: Former    Packs/day: 2.00    Years: 18.00    Pack years: 36.00    Types: Cigarettes    Quit date: 12/01/1980    Years since quitting: 40.6   Smokeless tobacco: Never  Vaping Use   Vaping Use: Never used  Substance and Sexual Activity   Alcohol use: No   Drug use: Yes    Types: Oxycodone, Morphine    Comment: takes methadone   Sexual activity: Not Currently  Other Topics Concern   Not on file  Social History Narrative   Not on file   Social Determinants of Health   Financial Resource Strain: Not on file  Food Insecurity: Not on file  Transportation Needs: No Transportation Needs   Lack of Transportation (Medical): No   Lack of Transportation (Non-Medical): No  Physical Activity: Inactive   Days of Exercise per Week: 0 days   Minutes of Exercise per Session: 0 min  Stress: Not on file  Social Connections: Not on file  Intimate Partner Violence: Not At Risk   Fear of Current or Ex-Partner: No   Emotionally Abused: No   Physically Abused: No   Sexually Abused: No    FAMILY HISTORY:  Family History  Problem Relation Age of Onset   CAD Father 25   Heart attack Father    COPD Sister    CAD Paternal Grandmother    Sudden Cardiac Death Neg Hx    Colon polyps Neg Hx    Colon cancer Neg Hx      CURRENT MEDICATIONS:  Current Outpatient Medications  Medication Sig Dispense Refill   amLODipine (NORVASC) 5 MG tablet Take 1 tablet (5 mg total) by mouth daily. 90 tablet 2   aspirin EC 81 MG tablet Take 1 tablet (81 mg total) by mouth daily with breakfast. Swallow whole. 30 tablet 11   atorvastatin (LIPITOR) 80 MG tablet TAKE 1 TABLET EVERY MORNING 90 tablet 3   b complex vitamins tablet Take 1 tablet by mouth every morning. 30 tablet 0   carboxymethylcellulose (REFRESH PLUS) 0.5 % SOLN Place 1 drop into both eyes in the morning, at noon, in the evening, and at bedtime.     clopidogrel (PLAVIX) 75 MG tablet TAKE 1 TABLET EVERY DAY 90 tablet 3   diclofenac Sodium (VOLTAREN) 1 %  GEL Apply 2 g topically 4 (four) times daily.     doxycycline (VIBRA-TABS) 100 MG tablet Take 1 tablet (100 mg total) by mouth 2 (two) times daily. 14 tablet 0   DULoxetine (CYMBALTA) 30 MG capsule Take 3 capsules (90 mg total) by mouth daily. (Patient taking differently: Take 30 mg by mouth daily.) 30 capsule 0   losartan (COZAAR) 100 MG tablet Take 1 tablet (100 mg total) by mouth daily. 90 tablet 3   metoprolol succinate (TOPROL-XL) 100 MG 24 hr tablet TAKE 1 AND 1/2 TABLETS DAILY (DOSE INCREASE) 135 tablet 2   Multiple Vitamin (MULTIVITAMIN ADULT PO) Take 1 tablet by mouth daily.     Multiple Vitamins-Minerals (EQ VISION FORMULA 50+ PO) Take 1 tablet by mouth every morning.     PAIN MANAGEMENT INTRATHECAL, IT, PUMP by Intrathecal route.     pregabalin (LYRICA) 50 MG capsule Take 50 mg by mouth 3 (three) times daily.     senna-docusate (SENOKOT-S) 8.6-50 MG tablet 1 tablet once daily     acetaminophen (TYLENOL) 325 MG tablet Take 2 tablets (650 mg total) by mouth every 6 (six) hours as needed for mild pain, fever or headache. (Patient not taking: Reported on 07/31/2021) 12 tablet 0   nitroGLYCERIN (NITROSTAT) 0.4 MG SL tablet Place 1 tablet (0.4 mg total) under the tongue every 5 (five) minutes x 3 doses as  needed for chest pain (if no relief after 3rd dose, proceed to the ED or call 911). (Patient not taking: Reported on 07/31/2021) 25 tablet 3   ondansetron (ZOFRAN) 4 MG tablet Take 1 tablet (4 mg total) by mouth daily as needed for nausea or vomiting. (Patient not taking: Reported on 07/31/2021) 20 tablet 0   oxyCODONE (OXY IR/ROXICODONE) 5 MG immediate release tablet Take 5 mg by mouth every 6 (six) hours as needed for moderate pain. (Patient not taking: Reported on 07/31/2021)     polyethylene glycol powder (GLYCOLAX/MIRALAX) 17 GM/SCOOP powder Take 17 g by mouth daily as needed for moderate constipation.  (Patient not taking: Reported on 07/31/2021)     No current facility-administered medications for this visit.   Facility-Administered Medications Ordered in Other Visits  Medication Dose Route Frequency Provider Last Rate Last Admin   0.9 %  sodium chloride infusion   Intravenous Continuous Derek Jack, MD 500 mL/hr at 07/31/21 0920 New Bag at 07/31/21 0920   Zoledronic Acid (ZOMETA) IVPB 4 mg  4 mg Intravenous Once Derek Jack, MD        ALLERGIES:  Allergies  Allergen Reactions   Brilinta [Ticagrelor] Diarrhea and Nausea Only    Nausea and severe diarrhea- general weakness. Patient says does not want to take again   Budesonide Palpitations   Albuterol Other (See Comments)    Heart racing     PHYSICAL EXAM:  Performance status (ECOG): 4 - Bedbound  Vitals:   07/31/21 0905  BP: (!) 160/66  Pulse: 78  Resp: 20  Temp: (!) 96.8 F (36 C)  SpO2: 97%   Wt Readings from Last 3 Encounters:  05/01/21 198 lb (89.8 kg)  03/31/21 204 lb 9.4 oz (92.8 kg)  03/11/21 195 lb (88.5 kg)   Physical Exam Vitals reviewed.  Constitutional:      Appearance: Normal appearance.  Cardiovascular:     Rate and Rhythm: Normal rate and regular rhythm.     Pulses: Normal pulses.     Heart sounds: Normal heart sounds.  Pulmonary:     Effort: Pulmonary effort  is normal.     Breath  sounds: Normal breath sounds.  Neurological:     General: No focal deficit present.     Mental Status: She is alert and oriented to person, place, and time.  Psychiatric:        Mood and Affect: Mood normal.        Behavior: Behavior normal.     LABORATORY DATA:  I have reviewed the labs as listed.  CBC Latest Ref Rng & Units 07/26/2021 05/10/2021 05/01/2021  WBC 4.0 - 10.5 K/uL 4.5 4.4 4.7  Hemoglobin 12.0 - 15.0 g/dL 8.5(L) 9.1(L) 9.4(L)  Hematocrit 36.0 - 46.0 % 24.8(L) 27.1(L) 28.5(L)  Platelets 150 - 400 K/uL 198 223 177   CMP Latest Ref Rng & Units 07/26/2021 05/10/2021 05/01/2021  Glucose 70 - 99 mg/dL 117(H) 121(H) 98  BUN 8 - 23 mg/dL 25(H) 19 20  Creatinine 0.44 - 1.00 mg/dL 1.19(H) 0.82 0.87  Sodium 135 - 145 mmol/L 139 140 140  Potassium 3.5 - 5.1 mmol/L 3.7 3.7 4.1  Chloride 98 - 111 mmol/L 100 99 100  CO2 22 - 32 mmol/L 35(H) 35(H) 31  Calcium 8.9 - 10.3 mg/dL 12.0(H) 10.6(H) 11.8(H)  Total Protein 6.5 - 8.1 g/dL 5.8(L) 5.7(L) 6.0(L)  Total Bilirubin 0.3 - 1.2 mg/dL 0.8 0.9 1.5(H)  Alkaline Phos 38 - 126 U/L 52 77 70  AST 15 - 41 U/L '19 19 20  ' ALT 0 - 44 U/L '13 15 12    ' DIAGNOSTIC IMAGING:  I have independently reviewed the scans and discussed with the patient. No results found.   ASSESSMENT:  1.  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 -Ultrasound-guided biopsy on 01/07/2016 showed grade 1 invasive ductal carcinoma, ER positive, PR positive, HER-2 equivocal by FISH, right axillary lymph node with metastatic adenocarcinoma.  HER-2 equivocal since her 2/CEP17 ratio less than 2 and average copy number of her 2 is greater than or equal to 4 but less than 6 signals per cell. -Bone biopsy on 05/30/2016 consistent with metastatic  breast cancer. -Palliative therapy with letrozole started under the direction of Dr. Deitra Mayo at [facility] from 02/25/2016 through February 2020, progressive symptoms with increased soft tissue extension at L2. 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 -Addition of [facility] was discussed but she was concerned about cost of the medication. -CT abdomen and pelvis on 08/13/2020 shows acute compression fracture deformity of the L1 vertebral body and other chronic findings. -Her last Faslodex injection at [place] was on 11/06/2020. -CT CAP on 02/19/2021 with no evidence of for solid organ metastasis or nodal metastasis.  Right breast lesion is stable.  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 -Bone scan on 02/19/2021 was negative for bone mets.   2.  Social/family history: -She worked for [employer] prior to retirement in 2010. -She is currently living with her son who is helping with her day-to-day activities.  Daughter lives in [place].  She quit smoking 35 to 40 years ago. -She is mostly confined to bed.  However she is able to change clothes, able to wash upper body, halfway dress herself.  She cannot stand up for the past 2 years because of weakness in the legs and knee arthritis.  She wears adult diapers.   PLAN:  1.  Metastatic right breast cancer to the bones, ER/PR positive and HER-2 equivocal: - She is tolerating Faslodex reasonably well. - She does not report any new onset pains. - She thought she felt some mass in the right upper breast but I could not palpate it. - We reviewed her labs from 07/26/2021. - She will proceed with a CT scan CAP and bone scan for restaging purposes. - She will need help with day-to-day activities like showering.  She will benefit from home health. - I will see her back on 08/22/2021 to discuss results of scans and her next dose of Faslodex.   2.  Cancer associated pain: -She has a compression fracture of L1/L2.  She follows up  with pain clinic at [facility].  She has intrathecal pump which has morphine. - She will continue oxycodone 5 mg 3-4 times daily as needed for breakthrough pain. - Continue duloxetine 90 mg daily and Lyrica 50 mg 3 times daily.  3.  Malignant hypercalcemia: - Calcium level on 07/26/2021 was elevated at 12.  Albumin is 3.2.  Creatinine is also trending up at 1.19. - She had bone metastasis from her breast cancer. - We talked about giving her hydration today.  We also talked about giving Zometa 4 mg x 1. - We will also start her on denosumab monthly injections.  We talked about side effects in detail.   Orders placed this encounter:  No orders of the defined types were placed in this encounter.    Derek Jack, MD [facility] [phone]   I, Thana Ates, am acting as a scribe for Dr. Derek Jack.  I, Derek Jack MD, have reviewed the above documentation for accuracy and completeness, and I agree with the above.

## 2021-07-31 ENCOUNTER — Inpatient Hospital Stay (HOSPITAL_COMMUNITY): Payer: Medicare Other

## 2021-07-31 ENCOUNTER — Other Ambulatory Visit: Payer: Self-pay

## 2021-07-31 ENCOUNTER — Encounter (HOSPITAL_COMMUNITY)
Admission: RE | Admit: 2021-07-31 | Discharge: 2021-07-31 | Disposition: A | Discharge status: Home / Self Care | Payer: Medicare Other | Source: Ambulatory Visit | Attending: Hematology and Oncology | Admitting: Hematology and Oncology

## 2021-07-31 ENCOUNTER — Ambulatory Visit (HOSPITAL_COMMUNITY)
Admission: RE | Admit: 2021-07-31 | Discharge: 2021-07-31 | Disposition: A | Discharge status: Home / Self Care | Payer: Medicare Other | Source: Ambulatory Visit | Attending: Hematology and Oncology | Admitting: Hematology and Oncology

## 2021-07-31 ENCOUNTER — Inpatient Hospital Stay (HOSPITAL_BASED_OUTPATIENT_CLINIC_OR_DEPARTMENT_OTHER): Payer: Medicare Other | Admitting: Hematology

## 2021-07-31 VITALS — BP 160/66 | HR 78 | Temp 96.8°F | Resp 20

## 2021-07-31 DIAGNOSIS — C50911 Malignant neoplasm of unspecified site of right female breast: Secondary | ICD-10-CM

## 2021-07-31 DIAGNOSIS — Z170 Estrogen receptor positive status [ER+]: Secondary | ICD-10-CM | POA: Insufficient documentation

## 2021-07-31 MED ORDER — SODIUM CHLORIDE 0.9 % IV SOLN: INTRAVENOUS | Status: DC

## 2021-07-31 MED ORDER — ZOLEDRONIC ACID 4 MG/100ML IV SOLN
4.0000 mg | Freq: Once | INTRAVENOUS | Status: AC
  Administered 2021-07-31: 4 mg via INTRAVENOUS

## 2021-07-31 MED ORDER — TECHNETIUM TC 99M MEDRONATE IV KIT
20.0000 | PACK | Freq: Once | INTRAVENOUS | Status: AC | PRN
  Administered 2021-07-31: 22 via INTRAVENOUS

## 2021-07-31 MED ORDER — IOHEXOL 350 MG/ML SOLN
75.0000 mL | Freq: Once | INTRAVENOUS | Status: AC | PRN
  Administered 2021-07-31: 75 mL via INTRAVENOUS

## 2021-07-31 NOTE — Patient Instructions (Signed)
[facility]  Discharge Instructions: Thank you for choosing [facility] to provide your oncology and hematology care.  If you have a lab appointment with the [facility], please come in thru the Main Entrance and check in at the main information desk.    We strive to give you quality time with your provider. You may need to reschedule your appointment if you arrive late (15 or more minutes).  Arriving late affects you and other patients whose appointments are after yours.  Also, if you miss three or more appointments without notifying the office, you may be dismissed from the clinic at the provider's discretion.      For prescription refill requests, have your pharmacy contact our office and allow 72 hours for refills to be completed.       T  Should you have questions after your visit or need to cancel or reschedule your appointment, please contact [facility] [phone]  and follow the prompts.  Office hours are 8:00 a.m. to 4:30 p.m. Monday - Friday. Please note that voicemails left after 4:00 p.m. may not be returned until the following business day.  We are closed weekends and major holidays. You have access to a nurse at all times for urgent questions. Please call the main number to the clinic [phone] and follow the prompts.  For any non-urgent questions, you may also contact your provider using MyChart. We now offer e-Visits for anyone 2 and older to request care online for non-urgent symptoms. For details visit [email or web address].   Also download the MyChart app! Go to the app store, search "MyChart", open the app, select [facility], and log in with your MyChart username and password.  Due to Covid, a mask is required upon entering the hospital/clinic. If you do not have a mask, one will be given to you upon arrival. For doctor visits, patients may have 1 support person aged 38 or older with them. For treatment visits, patients cannot  have anyone with them due to current Covid guidelines and our immunocompromised population.

## 2021-07-31 NOTE — Progress Notes (Signed)
1140-Hydration and zometa given per orders. Patient tolerated it well without problems. Vitals stable and discharged to the nuclear med department today from clinic via stretcher. Follow up as scheduled.

## 2021-07-31 NOTE — Patient Instructions (Addendum)
[place] at [facility] Discharge Instructions  You were seen today by Dr. Delton Coombes. He went over your recent results and scans, and you received your treatment. Dr. Delton Coombes will see you back in on August 22, 2021 for labs and follow up.   Thank you for choosing [place] at [facility] to provide your oncology and hematology care.  To afford each patient quality time with our provider, please arrive at least 15 minutes before your scheduled appointment time.   If you have a lab appointment with the [facility] please come in thru the Main Entrance and check in at the main information desk  You need to re-schedule your appointment should you arrive 10 or more minutes late.  We strive to give you quality time with our providers, and arriving late affects you and other patients whose appointments are after yours.  Also, if you no show three or more times for appointments you may be dismissed from the clinic at the providers discretion.     Again, thank you for choosing [facility].  Our hope is that these requests will decrease the amount of time that you wait before being seen by our physicians.       _____________________________________________________________  Should you have questions after your visit to [facility], please contact our office at [phone] between the hours of 8:00 a.m. and 4:30 p.m.  Voicemails left after 4:00 p.m. will not be returned until the following business day.  For prescription refill requests, have your pharmacy contact our office and allow 72 hours.    Cancer Center Support Programs:   > Cancer Support Group  2nd Tuesday of the month 1pm-2pm, Journey Room

## 2021-07-31 NOTE — Addendum Note (Signed)
Addended by: Joie Bimler on: 07/31/2021 12:23 PM   Modules accepted: Orders

## 2021-08-07 ENCOUNTER — Ambulatory Visit (HOSPITAL_COMMUNITY): Payer: Medicare Other | Admitting: Hematology

## 2021-08-23 ENCOUNTER — Ambulatory Visit (HOSPITAL_COMMUNITY): Payer: Medicare Other

## 2021-08-28 ENCOUNTER — Ambulatory Visit (HOSPITAL_COMMUNITY): Payer: Medicare Other | Admitting: Hematology

## 2021-08-28 ENCOUNTER — Inpatient Hospital Stay (HOSPITAL_COMMUNITY): Payer: Medicare Other

## 2021-08-28 ENCOUNTER — Ambulatory Visit (HOSPITAL_COMMUNITY): Payer: Medicare Other

## 2021-09-02 NOTE — Progress Notes (Signed)
[facility] [address] [place] [address]   Patient Care Team: Sela Hilding as PCP - General (Internal Medicine) Harl Bowie Alphonse Guild, MD as PCP - Cardiology (Cardiology) Brien Mates, RN as Oncology Nurse Navigator (Oncology)  SUMMARY OF ONCOLOGIC HISTORY: Oncology History  Metastatic breast cancer ([place])  11/23/2017 Initial Diagnosis   Metastatic breast cancer ([place])   02/07/2021 Cancer Staging   Staging form: Breast, AJCC 8th Edition - Clinical stage from 02/07/2021: Stage IV (cT2, cN1, pM1, G1, ER+, PR+, HER2: Equivocal) - Signed by Derek Jack, MD on 02/07/2021 Histopathologic type: Infiltrating duct carcinoma, NOS Stage prefix: Initial diagnosis Histologic grading system: 3 grade system   02/07/2021 - 02/07/2021 Chemotherapy            CHIEF COMPLIANT: Follow-up of 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 Her last visit was on 07/31/2021.   Today she reports feeling well. She reports continued severe pain in her right hip, leg, and back.   REVIEW OF SYSTEMS:   Review of Systems  Constitutional:  Positive for fatigue (depleted). Negative for appetite change.  Cardiovascular:  Positive for chest pain.  Gastrointestinal:  Positive for constipation.  Musculoskeletal:  Positive for arthralgias (10/10 R leg and hip) and back pain (10/10).  Neurological:  Positive for dizziness and numbness (L leg).  All other systems reviewed and are negative.  I have reviewed the past medical history, past surgical history, social history and family history with the patient and they are unchanged from previous note.   ALLERGIES:   is allergic to brilinta [ticagrelor], budesonide, and albuterol.   MEDICATIONS:  Current Outpatient Medications  Medication Sig Dispense Refill   acetaminophen (TYLENOL) 325  MG tablet Take 2 tablets (650 mg total) by mouth every 6 (six) hours as needed for mild pain, fever or headache. (Patient not taking: Reported on 07/31/2021) 12 tablet 0   amLODipine (NORVASC) 5 MG tablet Take 1 tablet (5 mg total) by mouth daily. 90 tablet 2   aspirin EC 81 MG tablet Take 1 tablet (81 mg total) by mouth daily with breakfast. Swallow whole. 30 tablet 11   atorvastatin (LIPITOR) 80 MG tablet TAKE 1 TABLET EVERY MORNING 90 tablet 3   b complex vitamins tablet Take 1 tablet by mouth every morning. 30 tablet 0   carboxymethylcellulose (REFRESH PLUS) 0.5 % SOLN Place 1 drop into both eyes in the morning, at noon, in the evening, and at bedtime.     clopidogrel (PLAVIX) 75 MG tablet TAKE 1 TABLET EVERY DAY 90 tablet 3   diclofenac Sodium (VOLTAREN) 1 % GEL Apply 2 g topically 4 (four) times daily.     doxycycline (VIBRA-TABS) 100 MG tablet Take 1 tablet (100 mg total) by mouth 2 (two) times daily. 14 tablet 0   DULoxetine (CYMBALTA) 30 MG capsule Take 3 capsules (90 mg total) by mouth daily. (Patient taking differently: Take 30 mg by mouth daily.) 30 capsule 0   losartan (COZAAR) 100 MG tablet Take 1 tablet (100 mg total) by mouth daily. 90 tablet 3   metoprolol succinate (TOPROL-XL) 100 MG 24 hr tablet TAKE 1 AND 1/2 TABLETS DAILY (DOSE INCREASE) 135 tablet 2   Multiple Vitamin (MULTIVITAMIN ADULT PO) Take 1 tablet by mouth daily.     Multiple Vitamins-Minerals (EQ VISION FORMULA 50+ PO) Take  1 tablet by mouth every morning.     nitroGLYCERIN (NITROSTAT) 0.4 MG SL tablet Place 1 tablet (0.4 mg total) under the tongue every 5 (five) minutes x 3 doses as needed for chest pain (if no relief after 3rd dose, proceed to the ED or call 911). (Patient not taking: Reported on 07/31/2021) 25 tablet 3   ondansetron (ZOFRAN) 4 MG tablet Take 1 tablet (4 mg total) by mouth daily as needed for nausea or vomiting. (Patient not taking: Reported on 07/31/2021) 20 tablet 0   oxyCODONE (OXY IR/ROXICODONE) 5 MG  immediate release tablet Take 5 mg by mouth every 6 (six) hours as needed for moderate pain. (Patient not taking: Reported on 07/31/2021)     PAIN MANAGEMENT INTRATHECAL, IT, PUMP by Intrathecal route.     polyethylene glycol powder (GLYCOLAX/MIRALAX) 17 GM/SCOOP powder Take 17 g by mouth daily as needed for moderate constipation.  (Patient not taking: Reported on 07/31/2021)     pregabalin (LYRICA) 50 MG capsule Take 50 mg by mouth 3 (three) times daily.     senna-docusate (SENOKOT-S) 8.6-50 MG tablet 1 tablet once daily     No current facility-administered medications for this visit.     PHYSICAL EXAMINATION: Performance status (ECOG): 4 - Bedbound  There were no vitals filed for this visit. Wt Readings from Last 3 Encounters:  05/01/21 198 lb (89.8 kg)  03/31/21 204 lb 9.4 oz (92.8 kg)  03/11/21 195 lb (88.5 kg)   Physical Exam Vitals reviewed.  Constitutional:      Appearance: Normal appearance.  Cardiovascular:     Rate and Rhythm: Normal rate and regular rhythm.     Pulses: Normal pulses.     Heart sounds: Normal heart sounds.  Pulmonary:     Effort: Pulmonary effort is normal.     Breath sounds: Normal breath sounds.  Chest:  Breasts:    Right: Tenderness present.  Musculoskeletal:     Right lower leg: No edema.     Left lower leg: No edema.  Neurological:     General: No focal deficit present.     Mental Status: She is alert and oriented to person, place, and time.  Psychiatric:        Mood and Affect: Mood normal.        Behavior: Behavior normal.    Breast Exam Chaperone: Thana Ates     LABORATORY DATA:  I have reviewed the data as listed CMP Latest Ref Rng & Units 07/26/2021 05/10/2021 05/01/2021  Glucose 70 - 99 mg/dL 117(H) 121(H) 98  BUN 8 - 23 mg/dL 25(H) 19 20  Creatinine 0.44 - 1.00 mg/dL 1.19(H) 0.82 0.87  Sodium 135 - 145 mmol/L 139 140 140  Potassium 3.5 - 5.1 mmol/L 3.7 3.7 4.1  Chloride 98 - 111 mmol/L 100 99 100  CO2 22 - 32 mmol/L 35(H)  35(H) 31  Calcium 8.9 - 10.3 mg/dL 12.0(H) 10.6(H) 11.8(H)  Total Protein 6.5 - 8.1 g/dL 5.8(L) 5.7(L) 6.0(L)  Total Bilirubin 0.3 - 1.2 mg/dL 0.8 0.9 1.5(H)  Alkaline Phos 38 - 126 U/L 52 77 70  AST 15 - 41 U/L $Remo'19 19 20  'sWqwY$ ALT 0 - 44 U/L $Remo'13 15 12   'SeomP$ Lab Results  Component Value Date   CAN153 17.1 02/07/2021   Lab Results  Component Value Date   WBC 4.5 07/26/2021   HGB 8.5 (L) 07/26/2021   HCT 24.8 (L) 07/26/2021   MCV 100.8 (H) 07/26/2021   PLT 198 07/26/2021  NEUTROABS 2.7 07/26/2021    ASSESSMENT:  1.  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 -Ultrasound-guided biopsy on 01/07/2016 showed grade 1 invasive ductal carcinoma, ER positive, PR positive, HER-2 equivocal by FISH, right axillary lymph node with metastatic adenocarcinoma.  HER-2 equivocal since her 2/CEP17 ratio less than 2 and average copy number of her 2 is greater than or equal to 4 but less than 6 signals per cell. -Bone biopsy on 05/30/2016 consistent with metastatic breast cancer. -Palliative therapy with letrozole started under the direction of Dr. Deitra Mayo at [facility] from 02/25/2016 through February 2020, progressive symptoms with increased soft tissue extension at L2. 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 -Addition of Leslee Home was discussed but she was concerned about cost of the medication. -CT abdomen and pelvis on 08/13/2020 shows acute compression fracture deformity of the L1 vertebral body and other chronic findings. -Her last Faslodex injection at [facility] was on 11/06/2020. -CT CAP on 02/19/2021 with no evidence of for solid organ metastasis or nodal metastasis.  Right breast lesion is stable.  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 -Bone  scan on 02/19/2021 was negative for bone mets.   2.  Social/family history: -She worked for [employer] prior to retirement in 2010. -She is currently living with her son who is helping with her day-to-day activities.  Daughter lives in [place].  She quit smoking 35 to 40 years ago. -She is mostly confined to bed.  However she is able to change clothes, able to wash upper body, halfway dress herself.  She cannot stand up for the past 2 years because of weakness in the legs and knee arthritis.  She wears adult diapers.   PLAN:  1.  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 - She has right upper quadrant breast mass which is stable.  No lymphadenopathy palpable. - Reviewed images of the CT CAP from 07/31/2021.  Stable right breast calcifications.  No new or progressive findings.  Stable sclerotic appearance of the left inferior pubic ramus and L2 vertebral body and L1 compression deformity.  2 cm heterogeneous lesion in the upper pole of the left kidney suspicious for neoplasm stable. - Continue Faslodex today and monthly.  RTC 3 months for follow-up. - We will check tumor markers.  If there is stable, will consider repeat imaging in 6 months.   2.  Cancer associated pain: - She has compression fracture of L1/L2.  She follows up with [facility] clinic at [facility].  She had intrathecal pump filled with morphine last Friday.  No new pains. - Continue oxycodone 5 mg 3-4 times daily as needed for breakthrough pain.  Continue duloxetine 90 mg daily and Lyrica 50 mg 3 times daily.  3.  Malignant hypercalcemia: -After receiving Zometa at last visit her calcium improved to 9.2 today.  4.  Bone metastasis: - We will start her on denosumab monthly.  Calcium today is 9.2.  Creatinine 0.69.  5.  Macrocytic anemia: - Hemoglobin today is 9.5 with MCV  104.7. - We will plan to check ferritin and iron panel.  Breast Cancer therapy associated  bone loss: I have recommended calcium, Vitamin D and weight bearing exercises.  Orders placed this encounter:  No orders of the defined types were placed in this encounter.   The patient has a good understanding of the overall plan. She agrees with it. She will call with any problems that may develop before the next visit here.  Derek Jack, MD [facility] [phone]   I, Thana Ates, am acting as a scribe for Dr. Derek Jack.  I, Derek Jack MD, have reviewed the above documentation for accuracy and completeness, and I agree with the above.

## 2021-09-03 ENCOUNTER — Other Ambulatory Visit (HOSPITAL_COMMUNITY): Payer: Medicare Other

## 2021-09-03 ENCOUNTER — Inpatient Hospital Stay (HOSPITAL_BASED_OUTPATIENT_CLINIC_OR_DEPARTMENT_OTHER): Payer: Medicare Other | Admitting: Hematology

## 2021-09-03 ENCOUNTER — Ambulatory Visit (HOSPITAL_COMMUNITY): Payer: Medicare Other

## 2021-09-03 ENCOUNTER — Inpatient Hospital Stay (HOSPITAL_COMMUNITY): Payer: Medicare Other

## 2021-09-03 ENCOUNTER — Ambulatory Visit (HOSPITAL_COMMUNITY): Payer: Medicare Other | Admitting: Hematology

## 2021-09-03 ENCOUNTER — Other Ambulatory Visit: Payer: Self-pay

## 2021-09-03 ENCOUNTER — Inpatient Hospital Stay (HOSPITAL_COMMUNITY): Payer: Medicare Other | Attending: Hematology

## 2021-09-03 VITALS — BP 107/85 | HR 120 | Temp 97.0°F | Resp 18

## 2021-09-03 DIAGNOSIS — C7951 Secondary malignant neoplasm of bone: Secondary | ICD-10-CM | POA: Diagnosis present

## 2021-09-03 DIAGNOSIS — Z170 Estrogen receptor positive status [ER+]: Secondary | ICD-10-CM

## 2021-09-03 DIAGNOSIS — C50911 Malignant neoplasm of unspecified site of right female breast: Secondary | ICD-10-CM

## 2021-09-03 DIAGNOSIS — C50411 Malignant neoplasm of upper-outer quadrant of right female breast: Secondary | ICD-10-CM | POA: Insufficient documentation

## 2021-09-03 DIAGNOSIS — C773 Secondary and unspecified malignant neoplasm of axilla and upper limb lymph nodes: Secondary | ICD-10-CM | POA: Diagnosis not present

## 2021-09-03 DIAGNOSIS — Z5111 Encounter for antineoplastic chemotherapy: Secondary | ICD-10-CM | POA: Diagnosis not present

## 2021-09-03 DIAGNOSIS — D649 Anemia, unspecified: Secondary | ICD-10-CM | POA: Diagnosis not present

## 2021-09-03 LAB — COMPREHENSIVE METABOLIC PANEL
ALT: 19 U/L (ref 0–44)
AST: 21 U/L (ref 15–41)
Albumin: 3.4 g/dL — ABNORMAL LOW (ref 3.5–5.0)
Alkaline Phosphatase: 84 U/L (ref 38–126)
Anion gap: 6 (ref 5–15)
BUN: 16 mg/dL (ref 8–23)
CO2: 29 mmol/L (ref 22–32)
Calcium: 9.2 mg/dL (ref 8.9–10.3)
Chloride: 105 mmol/L (ref 98–111)
Creatinine, Ser: 0.69 mg/dL (ref 0.44–1.00)
GFR, Estimated: >60 mL/min (ref >60)
Glucose, Bld: 109 mg/dL — ABNORMAL HIGH (ref 70–99)
Potassium: 4.7 mmol/L (ref 3.5–5.1)
Sodium: 140 mmol/L (ref 135–145)
Total Bilirubin: 0.9 mg/dL (ref 0.3–1.2)
Total Protein: 6.3 g/dL — ABNORMAL LOW (ref 6.5–8.1)

## 2021-09-03 LAB — CBC WITH DIFFERENTIAL/PLATELET
Abs Immature Granulocytes: 0.02 10*3/uL (ref 0.00–0.07)
Basophils Absolute: 0 10*3/uL (ref 0.0–0.1)
Basophils Relative: 1 %
Eosinophils Absolute: 0.3 10*3/uL (ref 0.0–0.5)
Eosinophils Relative: 4 %
HCT: 28.9 % — ABNORMAL LOW (ref 36.0–46.0)
Hemoglobin: 9.5 g/dL — ABNORMAL LOW (ref 12.0–15.0)
Immature Granulocytes: 0 %
Lymphocytes Relative: 18 %
Lymphs Abs: 1.2 10*3/uL (ref 0.7–4.0)
MCH: 34.4 pg — ABNORMAL HIGH (ref 26.0–34.0)
MCHC: 32.9 g/dL (ref 30.0–36.0)
MCV: 104.7 fL — ABNORMAL HIGH (ref 80.0–100.0)
Monocytes Absolute: 0.4 10*3/uL (ref 0.1–1.0)
Monocytes Relative: 7 %
Neutro Abs: 4.6 10*3/uL (ref 1.7–7.7)
Neutrophils Relative %: 70 %
Platelets: 250 10*3/uL (ref 150–400)
RBC: 2.76 MIL/uL — ABNORMAL LOW (ref 3.87–5.11)
RDW: 14.3 % (ref 11.5–15.5)
WBC: 6.5 10*3/uL (ref 4.0–10.5)
nRBC: 0 % (ref 0.0–0.2)

## 2021-09-03 MED ORDER — FULVESTRANT 250 MG/5ML IM SOLN
500.0000 mg | Freq: Once | INTRAMUSCULAR | Status: AC
  Administered 2021-09-03: 500 mg via INTRAMUSCULAR
  Filled 2021-09-03: qty 10

## 2021-09-03 MED ORDER — DENOSUMAB 120 MG/1.7ML ~~LOC~~ SOLN
120.0000 mg | Freq: Once | SUBCUTANEOUS | Status: AC
  Administered 2021-09-03: 120 mg via SUBCUTANEOUS
  Filled 2021-09-03: qty 1.7

## 2021-09-03 NOTE — Progress Notes (Signed)
Patient presents today for Faslodex and Xgeva injections.  Vital signs stable.  Labs within parameters.  Stable during administration without incident; injection site WNL; see MAR for injection details.  Patient tolerated procedure well and without incident.  No questions or complaints noted at this time. Discharge from clinic via [facility] squad in stable condition.  Alert and oriented X 3.  Follow up with [facility] as scheduled.

## 2021-09-03 NOTE — Patient Instructions (Signed)
[facility]  Discharge Instructions: Thank you for choosing [facility] to provide your oncology and hematology care.  If you have a lab appointment with the [facility], please come in thru the Main Entrance and check in at the main information desk.  Wear comfortable clothing and clothing appropriate for easy access to any Portacath or PICC line.   We strive to give you quality time with your provider. You may need to reschedule your appointment if you arrive late (15 or more minutes).  Arriving late affects you and other patients whose appointments are after yours.  Also, if you miss three or more appointments without notifying the office, you may be dismissed from the clinic at the provider's discretion.      For prescription refill requests, have your pharmacy contact our office and allow 72 hours for refills to be completed.    Today you received the following chemotherapy and/or immunotherapy agents Faslodex/Xgeva      To help prevent nausea and vomiting after your treatment, we encourage you to take your nausea medication as directed.  BELOW ARE SYMPTOMS THAT SHOULD BE REPORTED IMMEDIATELY: *FEVER GREATER THAN 100.4 F (38 C) OR HIGHER *CHILLS OR SWEATING *NAUSEA AND VOMITING THAT IS NOT CONTROLLED WITH YOUR NAUSEA MEDICATION *UNUSUAL SHORTNESS OF BREATH *UNUSUAL BRUISING OR BLEEDING *URINARY PROBLEMS (pain or burning when urinating, or frequent urination) *BOWEL PROBLEMS (unusual diarrhea, constipation, pain near the anus) TENDERNESS IN MOUTH AND THROAT WITH OR WITHOUT PRESENCE OF ULCERS (sore throat, sores in mouth, or a toothache) UNUSUAL RASH, SWELLING OR PAIN  UNUSUAL VAGINAL DISCHARGE OR ITCHING   Items with * indicate a potential emergency and should be followed up as soon as possible or go to the Emergency Department if any problems should occur.  Please show the CHEMOTHERAPY ALERT CARD or IMMUNOTHERAPY ALERT CARD at check-in to the Emergency  Department and triage nurse.  Should you have questions after your visit or need to cancel or reschedule your appointment, please contact [facility] [phone]  and follow the prompts.  Office hours are 8:00 a.m. to 4:30 p.m. Monday - Friday. Please note that voicemails left after 4:00 p.m. may not be returned until the following business day.  We are closed weekends and major holidays. You have access to a nurse at all times for urgent questions. Please call the main number to the clinic [phone] and follow the prompts.  For any non-urgent questions, you may also contact your provider using MyChart. We now offer e-Visits for anyone 74 and older to request care online for non-urgent symptoms. For details visit [email or web address].   Also download the MyChart app! Go to the app store, search "MyChart", open the app, select [facility], and log in with your MyChart username and password.  Due to Covid, a mask is required upon entering the hospital/clinic. If you do not have a mask, one will be given to you upon arrival. For doctor visits, patients may have 1 support person aged 74 or older with them. For treatment visits, patients cannot have anyone with them due to current Covid guidelines and our immunocompromised population.

## 2021-09-03 NOTE — Patient Instructions (Addendum)
[facility] at [facility] Discharge Instructions  You were seen today by Dr. Delton Coombes. He went over your recent results and scans, and you received your injections. Dr. Delton Coombes will see you back in 3 months for labs and follow up.   Thank you for choosing [facility] at [facility] to provide your oncology and hematology care.  To afford each patient quality time with our provider, please arrive at least 15 minutes before your scheduled appointment time.   If you have a lab appointment with the [facility] please come in thru the Main Entrance and check in at the main information desk  You need to re-schedule your appointment should you arrive 10 or more minutes late.  We strive to give you quality time with our providers, and arriving late affects you and other patients whose appointments are after yours.  Also, if you no show three or more times for appointments you may be dismissed from the clinic at the providers discretion.     Again, thank you for choosing [facility].  Our hope is that these requests will decrease the amount of time that you wait before being seen by our physicians.       _____________________________________________________________  Should you have questions after your visit to [facility], please contact our office at [phone] between the hours of 8:00 a.m. and 4:30 p.m.  Voicemails left after 4:00 p.m. will not be returned until the following business day.  For prescription refill requests, have your pharmacy contact our office and allow 72 hours.    Cancer Center Support Programs:   > Cancer Support Group  2nd Tuesday of the month 1pm-2pm, Journey Room

## 2021-09-25 IMAGING — CT CT HEAD W/O CM
3 series · 15 of 47 positions shown, 18 images · non-contrast
Comparison: 11/13/2018

CLINICAL DATA: Delirium, elevated blood pressure

EXAM:
CT HEAD WITHOUT CONTRAST
TECHNIQUE: Contiguous axial images were obtained from the base of the skull
through the vertex without intravenous contrast. Sagittal and
coronal MPR images reconstructed from axial data set.

[Series 2: head w o · axial · 0.44mm/px · z∈[+1609,+1734]mm · 9 of 31 slices shown, 12 images]
[im 3/31  brain]
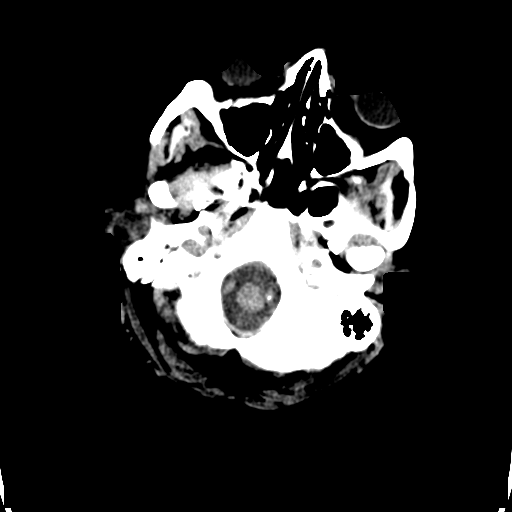
[im 3/31  bone]
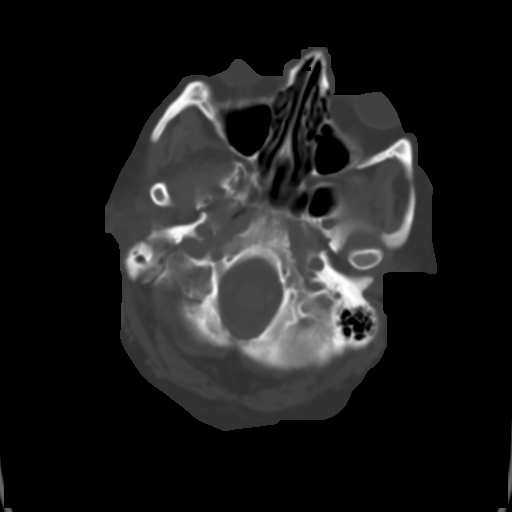
[im 6/31  brain]
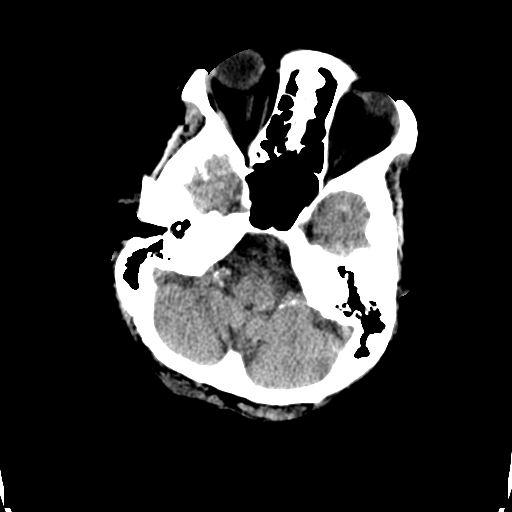
[im 9/31  brain]
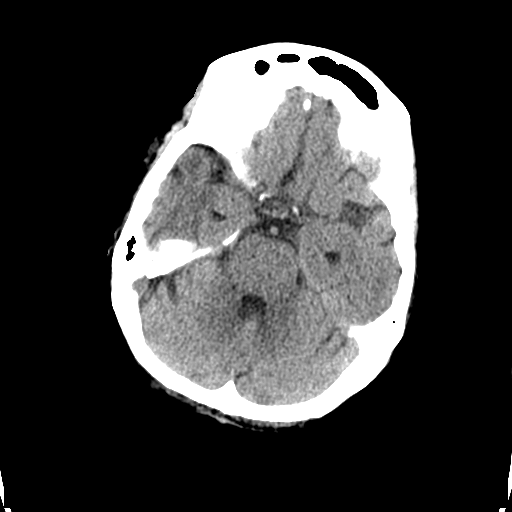
[im 12/31  brain]
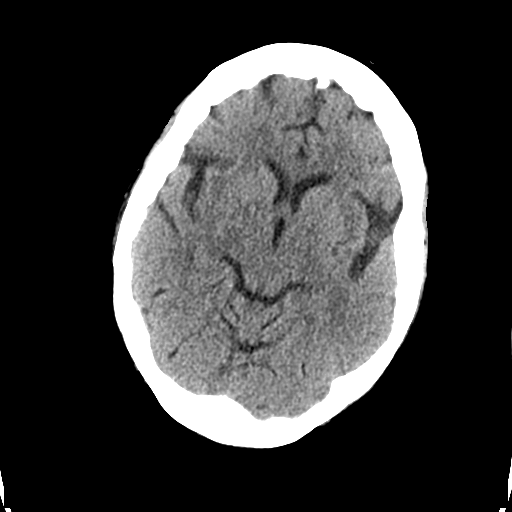
[im 16/31  brain]
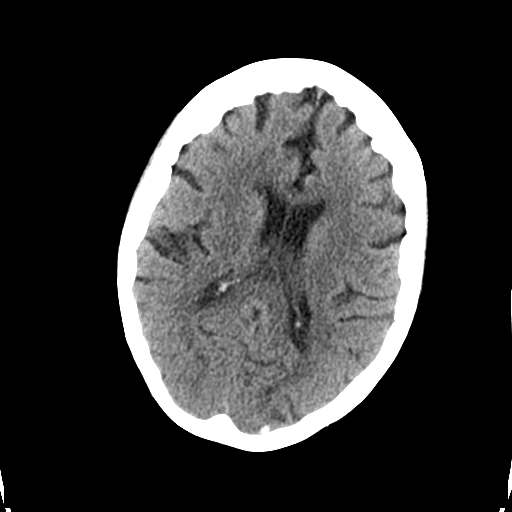
[im 16/31  bone]
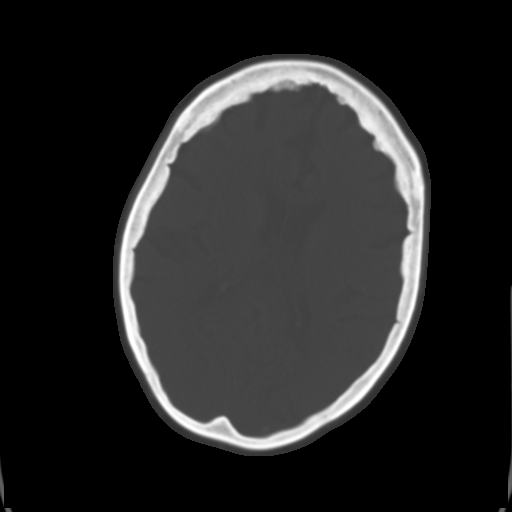
[im 19/31  brain]
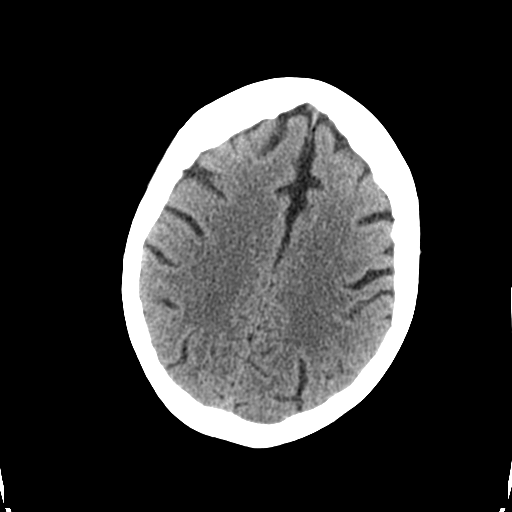
[im 22/31  brain]
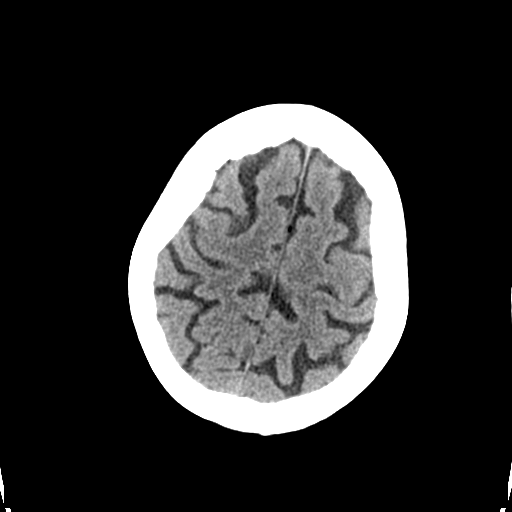
[im 25/31  brain]
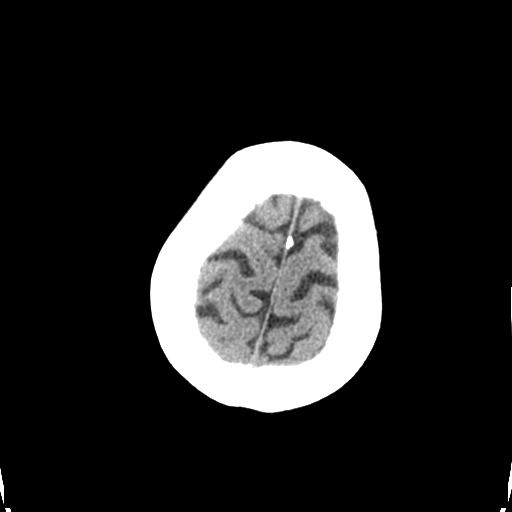
[im 28/31  brain]
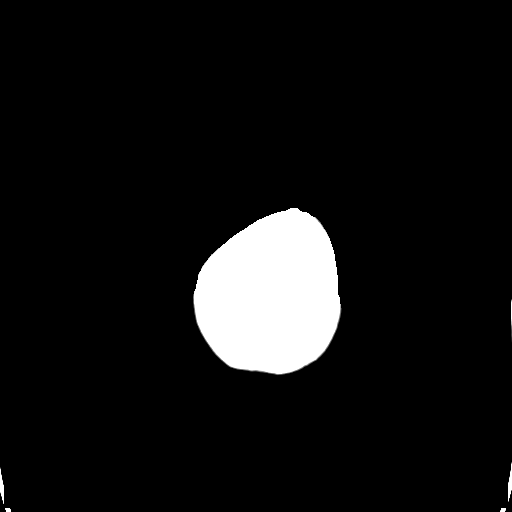
[im 28/31  bone]
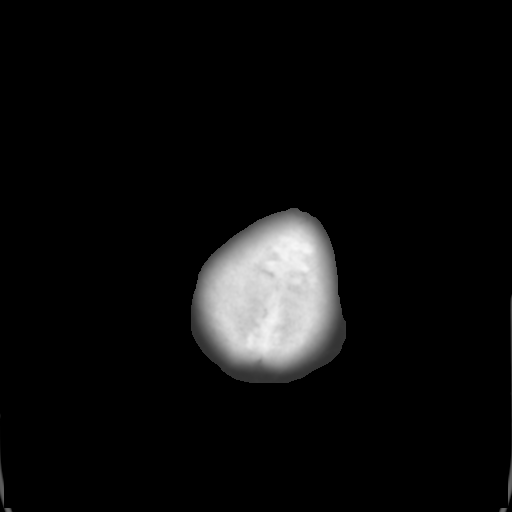

[Series 4: coronal soft · coronal · 0.31mm/px · 3 of 67 slices shown]
[im 23/67  brain]
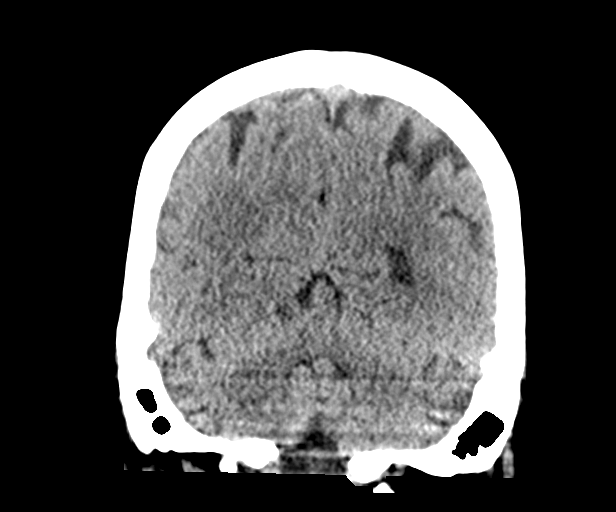
[im 30/67  brain]
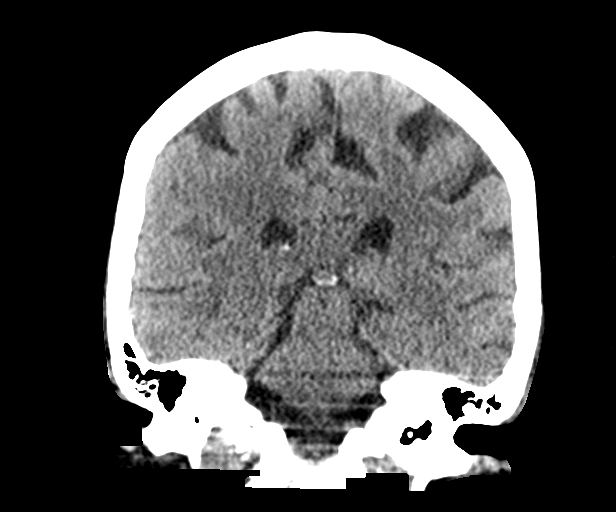
[im 37/67  brain]
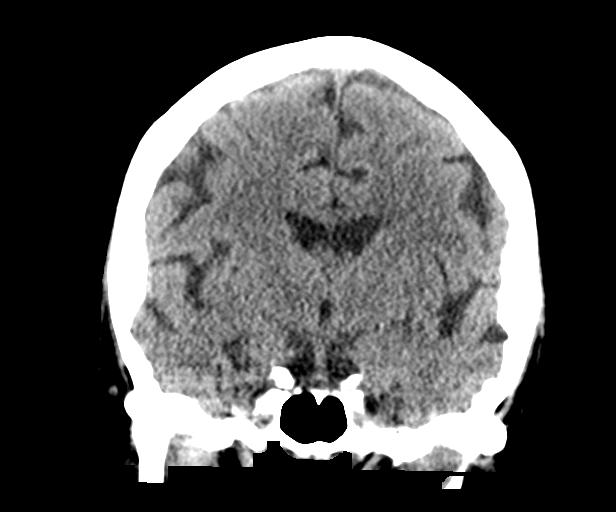

[Series 5: sagittal soft · sagittal · 0.31mm/px · 3 of 52 slices shown]
[im 18/52  brain]
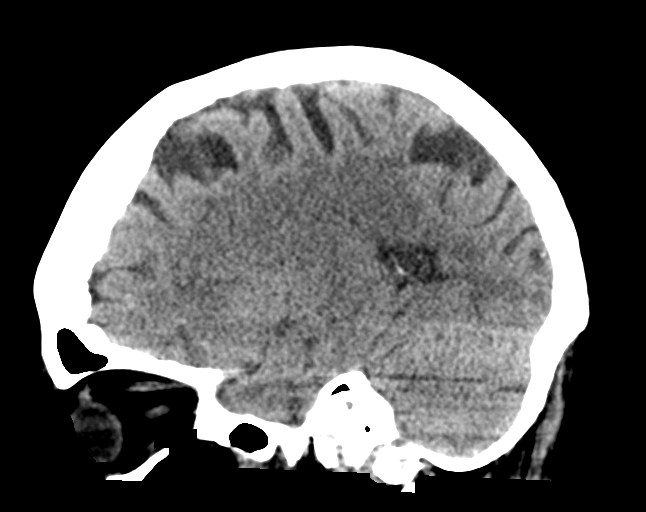
[im 26/52  brain]
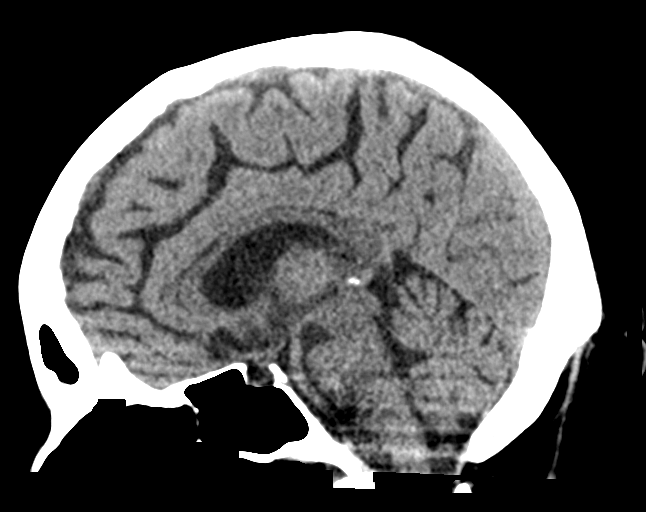
[im 35/52  brain]
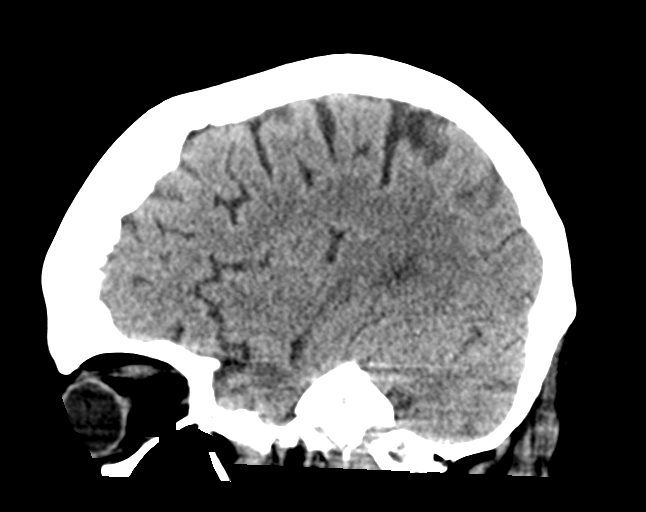

[15 of 47 positions shown; findings below may reference images not displayed]

FINDINGS: Brain: Mild generalized atrophy. Stable ventricular morphology. No
midline shift or mass effect. No intracranial hemorrhage, mass
lesion or evidence of acute infarction. No extra-axial fluid
collections.

Vascular: No hyperdense vessels

Skull: Intact

Sinuses/Orbits: Clear

Other: N/A
IMPRESSION: No acute intracranial abnormalities.

## 2021-10-01 ENCOUNTER — Inpatient Hospital Stay (HOSPITAL_COMMUNITY): Payer: Medicare Other

## 2021-10-01 ENCOUNTER — Other Ambulatory Visit: Payer: Self-pay

## 2021-10-01 ENCOUNTER — Inpatient Hospital Stay (HOSPITAL_COMMUNITY): Payer: Medicare Other | Attending: Hematology

## 2021-10-01 VITALS — BP 127/51 | HR 67 | Temp 97.0°F | Resp 18

## 2021-10-01 DIAGNOSIS — Z170 Estrogen receptor positive status [ER+]: Secondary | ICD-10-CM | POA: Insufficient documentation

## 2021-10-01 DIAGNOSIS — Z5111 Encounter for antineoplastic chemotherapy: Secondary | ICD-10-CM | POA: Diagnosis not present

## 2021-10-01 DIAGNOSIS — D649 Anemia, unspecified: Secondary | ICD-10-CM

## 2021-10-01 DIAGNOSIS — C50919 Malignant neoplasm of unspecified site of unspecified female breast: Secondary | ICD-10-CM | POA: Insufficient documentation

## 2021-10-01 DIAGNOSIS — D509 Iron deficiency anemia, unspecified: Secondary | ICD-10-CM | POA: Insufficient documentation

## 2021-10-01 DIAGNOSIS — C50911 Malignant neoplasm of unspecified site of right female breast: Secondary | ICD-10-CM

## 2021-10-01 DIAGNOSIS — C7951 Secondary malignant neoplasm of bone: Secondary | ICD-10-CM | POA: Insufficient documentation

## 2021-10-01 LAB — CBC WITH DIFFERENTIAL/PLATELET
Abs Immature Granulocytes: 0.01 10*3/uL (ref 0.00–0.07)
Basophils Absolute: 0 10*3/uL (ref 0.0–0.1)
Basophils Relative: 1 %
Eosinophils Absolute: 0.2 10*3/uL (ref 0.0–0.5)
Eosinophils Relative: 4 %
HCT: 29.8 % — ABNORMAL LOW (ref 36.0–46.0)
Hemoglobin: 9.7 g/dL — ABNORMAL LOW (ref 12.0–15.0)
Immature Granulocytes: 0 %
Lymphocytes Relative: 26 %
Lymphs Abs: 1 10*3/uL (ref 0.7–4.0)
MCH: 34.3 pg — ABNORMAL HIGH (ref 26.0–34.0)
MCHC: 32.6 g/dL (ref 30.0–36.0)
MCV: 105.3 fL — ABNORMAL HIGH (ref 80.0–100.0)
Monocytes Absolute: 0.3 10*3/uL (ref 0.1–1.0)
Monocytes Relative: 8 %
Neutro Abs: 2.4 10*3/uL (ref 1.7–7.7)
Neutrophils Relative %: 61 %
Platelets: 197 10*3/uL (ref 150–400)
RBC: 2.83 MIL/uL — ABNORMAL LOW (ref 3.87–5.11)
RDW: 14.4 % (ref 11.5–15.5)
WBC: 3.9 10*3/uL — ABNORMAL LOW (ref 4.0–10.5)
nRBC: 0 % (ref 0.0–0.2)

## 2021-10-01 LAB — FERRITIN: Ferritin: 67 ng/mL (ref 11–307)

## 2021-10-01 LAB — COMPREHENSIVE METABOLIC PANEL
ALT: 15 U/L (ref 0–44)
AST: 21 U/L (ref 15–41)
Albumin: 3.4 g/dL — ABNORMAL LOW (ref 3.5–5.0)
Alkaline Phosphatase: 48 U/L (ref 38–126)
Anion gap: 5 (ref 5–15)
BUN: 16 mg/dL (ref 8–23)
CO2: 32 mmol/L (ref 22–32)
Calcium: 9.1 mg/dL (ref 8.9–10.3)
Chloride: 105 mmol/L (ref 98–111)
Creatinine, Ser: 0.74 mg/dL (ref 0.44–1.00)
GFR, Estimated: >60 mL/min (ref >60)
Glucose, Bld: 123 mg/dL — ABNORMAL HIGH (ref 70–99)
Potassium: 4.6 mmol/L (ref 3.5–5.1)
Sodium: 142 mmol/L (ref 135–145)
Total Bilirubin: 1 mg/dL (ref 0.3–1.2)
Total Protein: 6.2 g/dL — ABNORMAL LOW (ref 6.5–8.1)

## 2021-10-01 LAB — IRON AND TIBC
Iron: 54 ug/dL (ref 28–170)
Saturation Ratios: 20 % (ref 10.4–31.8)
TIBC: 274 ug/dL (ref 250–450)
UIBC: 220 ug/dL

## 2021-10-01 MED ORDER — DENOSUMAB 120 MG/1.7ML ~~LOC~~ SOLN
120.0000 mg | Freq: Once | SUBCUTANEOUS | Status: AC
  Administered 2021-10-01: 120 mg via SUBCUTANEOUS
  Filled 2021-10-01: qty 1.7

## 2021-10-01 MED ORDER — FULVESTRANT 250 MG/5ML IM SOSY
500.0000 mg | PREFILLED_SYRINGE | Freq: Once | INTRAMUSCULAR | Status: AC
  Administered 2021-10-01: 500 mg via INTRAMUSCULAR
  Filled 2021-10-01: qty 10

## 2021-10-01 NOTE — Patient Instructions (Signed)
[facility]  Discharge Instructions: Thank you for choosing [facility] to provide your oncology and hematology care.  If you have a lab appointment with the [facility], please come in thru the Main Entrance and check in at the main information desk.  Wear comfortable clothing and clothing appropriate for easy access to any Portacath or PICC line.   We strive to give you quality time with your provider. You may need to reschedule your appointment if you arrive late (15 or more minutes).  Arriving late affects you and other patients whose appointments are after yours.  Also, if you miss three or more appointments without notifying the office, you may be dismissed from the clinic at the provider's discretion.      For prescription refill requests, have your pharmacy contact our office and allow 72 hours for refills to be completed.    Today you received the following chemotherapy and/or immunotherapy agents Xgeva Faslodex      To help prevent nausea and vomiting after your treatment, we encourage you to take your nausea medication as directed.  BELOW ARE SYMPTOMS THAT SHOULD BE REPORTED IMMEDIATELY: *FEVER GREATER THAN 100.4 F (38 C) OR HIGHER *CHILLS OR SWEATING *NAUSEA AND VOMITING THAT IS NOT CONTROLLED WITH YOUR NAUSEA MEDICATION *UNUSUAL SHORTNESS OF BREATH *UNUSUAL BRUISING OR BLEEDING *URINARY PROBLEMS (pain or burning when urinating, or frequent urination) *BOWEL PROBLEMS (unusual diarrhea, constipation, pain near the anus) TENDERNESS IN MOUTH AND THROAT WITH OR WITHOUT PRESENCE OF ULCERS (sore throat, sores in mouth, or a toothache) UNUSUAL RASH, SWELLING OR PAIN  UNUSUAL VAGINAL DISCHARGE OR ITCHING   Items with * indicate a potential emergency and should be followed up as soon as possible or go to the Emergency Department if any problems should occur.  Please show the CHEMOTHERAPY ALERT CARD or IMMUNOTHERAPY ALERT CARD at check-in to the Emergency  Department and triage nurse.  Should you have questions after your visit or need to cancel or reschedule your appointment, please contact [facility] [phone]  and follow the prompts.  Office hours are 8:00 a.m. to 4:30 p.m. Monday - Friday. Please note that voicemails left after 4:00 p.m. may not be returned until the following business day.  We are closed weekends and major holidays. You have access to a nurse at all times for urgent questions. Please call the main number to the clinic [phone] and follow the prompts.  For any non-urgent questions, you may also contact your provider using MyChart. We now offer e-Visits for anyone 65 and older to request care online for non-urgent symptoms. For details visit [email or web address].   Also download the MyChart app! Go to the app store, search "MyChart", open the app, select [facility], and log in with your MyChart username and password.  Due to Covid, a mask is required upon entering the hospital/clinic. If you do not have a mask, one will be given to you upon arrival. For doctor visits, patients may have 1 support person aged 74 or older with them. For treatment visits, patients cannot have anyone with them due to current Covid guidelines and our immunocompromised population.

## 2021-10-01 NOTE — Progress Notes (Signed)
Patient presents today for Xgeva and Faslodex injections per providers order.  Patient has had no recent or scheduled dental work, no jaw pain, and is taking Calcium and Vitamin D supplements.  Vital signs within parameters for treatment.  Patient has no new complaints at this time.  Stable during administration without incident; injection site WNL; see MAR for injection details.  Patient tolerated procedure well and without incident.  No questions or complaints noted at this time.   Discharge from clinic by stretcher via [facility] in stable condition.  Alert and oriented X 3.  Follow up with [facility] as scheduled.

## 2021-10-02 LAB — CANCER ANTIGEN 15-3: CA 15-3: 20.4 U/mL (ref 0.0–25.0)

## 2021-10-02 LAB — CANCER ANTIGEN 27.29: CA 27.29: 16.7 U/mL (ref 0.0–38.6)

## 2021-10-23 ENCOUNTER — Encounter: Payer: Self-pay | Admitting: Family Medicine

## 2021-10-23 ENCOUNTER — Ambulatory Visit (INDEPENDENT_AMBULATORY_CARE_PROVIDER_SITE_OTHER): Payer: Medicare Other | Admitting: Family Medicine

## 2021-10-23 VITALS — BP 148/70 | HR 72 | Ht 60.0 in | Wt 205.0 lb

## 2021-10-23 DIAGNOSIS — I2510 Atherosclerotic heart disease of native coronary artery without angina pectoris: Secondary | ICD-10-CM | POA: Diagnosis not present

## 2021-10-23 DIAGNOSIS — I480 Paroxysmal atrial fibrillation: Secondary | ICD-10-CM | POA: Diagnosis not present

## 2021-10-23 DIAGNOSIS — I10 Essential (primary) hypertension: Secondary | ICD-10-CM | POA: Diagnosis not present

## 2021-10-23 DIAGNOSIS — I5022 Chronic systolic (congestive) heart failure: Secondary | ICD-10-CM

## 2021-10-23 NOTE — Progress Notes (Signed)
Virtual Visit via Telephone Note   This visit type was conducted due to national recommendations for restrictions regarding the COVID-19 Pandemic (e.g. social distancing) in an effort to limit this patient's exposure and mitigate transmission in our community.  Due to her co-morbid illnesses, this patient is at least at moderate risk for complications without adequate follow up.  This format is felt to be most appropriate for this patient at this time.  The patient did not have access to video technology/had technical difficulties with video requiring transitioning to audio format only (telephone).  All issues noted in this document were discussed and addressed.  No physical exam could be performed with this format.  Please refer to the patient's chart for her  consent to telehealth for [facility].    Date:  10/23/2021   ID:  Stacey [name], DOB 11/14/1946, MRN [id number] The patient was identified using 2 identifiers.  Patient Location: Home Provider Location: Office/Clinic   PCP:  [facility] Providers Cardiologist:  Carlyle Dolly, MD     Evaluation Performed:  Follow-Up Visit  Chief Complaint: Follow-up chronic systolic heart failure, CAD, PAF  History of Present Illness:    Stacey [name] is a 75 y.o. female with chronic systolic heart failure, CAD, PAF  She was last seen by Dr. Harl Bowie via telemedicine on 05/14/2021.  Her last echo in April 2022 EF was 50 to 55% with grade 1 DD.  No recent swelling or shortness of breath, DOE.  She was compliant with medications.  Previous DES to ramus and late stent thrombosis requiring PTCA and 2006.  DES to RCA in December 2018.  Previous history of shortness of breath on Brilinta which was later changed to Plavix.  She had no recent chest pains.  On echo in April 2022.she had mild to moderate aortic stenosis with mean gradient of 10.5 and dimensionless index of 0.31 and AVA space VTI 1.09.  History of chronic  left bundle branch block, history of metastatic breast cancer followed by daily.  Metastasis to bone and renal with pulmonary nodules.  History of atrial fibrillation off Coumadin for intrathecal pain pump.  Palliative management at [facility].  No recent palpitations.  Issues with high blood pressures at home with home blood pressure systolics running in the 518A to 150s.  On echo 03/25/2021 LVEF had normalized at 50 to 55% and she was continuing current meds.  Was continuing DAPT therapy due to history of late stent thrombosis.  Plan was to continue current medications.  No recent PAF symptoms.  Off Coumadin due to intrathecal pain pump placement.  Her blood pressure was elevated during that visit and plan was to call back with blood pressures later in the week and consider amlodipine versus Aldactone pending blood pressures.   I spoke to the patient by phone today.  She is doing well from a cardiac standpoint per her statement.  She states her main issue is her back problems.  She has physical therapy there today helping her.  She denies any anginal or exertional symptoms, palpitations or arrhythmias, orthostatic symptoms, CVA or TIA-like symptoms, PND, SOB/DOE or orthopnea.  She continues to see Dr. Delton Coombes for her breast cancer.  States she had some recent labs which looked good per her statement.  Denies any bleeding issues, claudication-like symptoms, DVT or PE-like symptoms, lower extremity edema.  Blood pressure is elevated today but she is in some pain from her back.  She states she believes she  needs some adjustment in her pain pump medication dosage to help with pain relief.  States her blood pressures are usually much better.  She states the addition of amlodipine seems to have helped with her blood pressures.   The patient does not have symptoms concerning for COVID-19 infection (fever, chills, cough, or new shortness of breath).    Past Medical History:  Diagnosis Date   CAD in native artery     a. DES to ramus 2005 with late stent thrombosis 2006 tx with PTCA. 12/18 PCI/DES x1 to mRCA, EF 50-55%   CHF (congestive heart failure) (HCC)    Chronic pain    COPD (chronic obstructive pulmonary disease) (HCC)    Hyperlipidemia    Hypertension    Left bundle branch block    Lymphedema    Metastatic breast cancer ([place])    a. to bone.   MI (myocardial infarction) ([place])    Mild aortic stenosis 10/2017   Morbid obesity ([place])    PAF (paroxysmal atrial fibrillation) ([place])    PSVT (paroxysmal supraventricular tachycardia) ([place])    a. per [facility] notes, seen on event monitor in 2014.   Pulmonary nodules    Past Surgical History:  Procedure Laterality Date   CHOLECYSTECTOMY N/A 03/29/2021   Procedure: LAPAROSCOPIC CHOLECYSTECTOMY;  Surgeon: [name], MD;  Location: [facility];  Service: General;  Laterality: N/A;   CORONARY STENT INTERVENTION N/A 11/26/2017   Procedure: CORONARY STENT INTERVENTION;  Surgeon: Martinique, Peter M, MD;  Location: [place] [facility];  Service: Cardiovascular;  Laterality: N/A;   CORONARY STENT PLACEMENT     ERCP N/A 03/28/2021   Procedure: ENDOSCOPIC RETROGRADE CHOLANGIOPANCREATOGRAPHY (ERCP);  Surgeon: Rogene Houston, MD;  Location: [facility];  Service: Endoscopy;  Laterality: N/A;   intrathecal pain pump     LEFT HEART CATH AND CORONARY ANGIOGRAPHY N/A 11/26/2017   Procedure: LEFT HEART CATH AND CORONARY ANGIOGRAPHY;  Surgeon: Martinique, Peter M, MD;  Location: [place] [facility];  Service: Cardiovascular;  Laterality: N/A;   REMOVAL OF STONES  03/28/2021   Procedure: REMOVAL OF STONES;  Surgeon: Rogene Houston, MD;  Location: [facility];  Service: Endoscopy;;   SPHINCTEROTOMY  03/28/2021   Procedure: SPHINCTEROTOMY;  Surgeon: Rogene Houston, MD;  Location: [facility];  Service: Endoscopy;;   UMBILICAL HERNIA REPAIR N/A 04/03/2021   Procedure: ADULT PRIMARY UMBILICAL HERNIA REPAIR;  Surgeon: [name], MD;  Location: [facility];  Service: General;  Laterality:  N/A;   VASCULAR SURGERY       Current Meds  Medication Sig   acetaminophen (TYLENOL) 325 MG tablet Take 2 tablets (650 mg total) by mouth every 6 (six) hours as needed for mild pain, fever or headache.   amLODipine (NORVASC) 5 MG tablet Take 1 tablet (5 mg total) by mouth daily.   aspirin EC 81 MG tablet Take 1 tablet (81 mg total) by mouth daily with breakfast. Swallow whole.   atorvastatin (LIPITOR) 80 MG tablet TAKE 1 TABLET EVERY MORNING   b complex vitamins tablet Take 1 tablet by mouth every morning.   carboxymethylcellulose (REFRESH PLUS) 0.5 % SOLN Place 1 drop into both eyes in the morning, at noon, in the evening, and at bedtime.   clopidogrel (PLAVIX) 75 MG tablet TAKE 1 TABLET EVERY DAY   DULoxetine (CYMBALTA) 30 MG capsule Take 3 capsules (90 mg total) by mouth daily. (Patient taking differently: Take 30 mg by mouth daily.)   losartan (COZAAR) 100 MG tablet Take  1 tablet (100 mg total) by mouth daily. (Patient taking differently: Take 50 mg by mouth daily.)   metoprolol succinate (TOPROL-XL) 100 MG 24 hr tablet TAKE 1 AND 1/2 TABLETS DAILY (DOSE INCREASE)   Multiple Vitamin (MULTIVITAMIN ADULT PO) Take 1 tablet by mouth daily.   Multiple Vitamins-Minerals (EQ VISION FORMULA 50+ PO) Take 1 tablet by mouth every morning.   nitroGLYCERIN (NITROSTAT) 0.4 MG SL tablet Place 1 tablet (0.4 mg total) under the tongue every 5 (five) minutes x 3 doses as needed for chest pain (if no relief after 3rd dose, proceed to the ED or call 911).   oxyCODONE (OXY IR/ROXICODONE) 5 MG immediate release tablet Take 5 mg by mouth every 6 (six) hours as needed for moderate pain.   PAIN MANAGEMENT INTRATHECAL, IT, PUMP by Intrathecal route.   polyethylene glycol powder (GLYCOLAX/MIRALAX) 17 GM/SCOOP powder Take 17 g by mouth daily as needed for moderate constipation.   pregabalin (LYRICA) 50 MG capsule Take 50 mg by mouth 3 (three) times daily.     Allergies:   Brilinta [ticagrelor], Budesonide, and  Albuterol   Social History   Tobacco Use   Smoking status: Former    Packs/day: 2.00    Years: 18.00    Pack years: 36.00    Types: Cigarettes    Quit date: 12/01/1980    Years since quitting: 40.9   Smokeless tobacco: Never  Vaping Use   Vaping Use: Never used  Substance Use Topics   Alcohol use: No   Drug use: Yes    Types: Oxycodone, Morphine    Comment: takes methadone     Family Hx: The patient's family history includes CAD in her paternal grandmother; CAD (age of onset: 29) in her father; COPD in her sister; Heart attack in her father. There is no history of Sudden Cardiac Death, Colon polyps, or Colon cancer.  ROS:   Please see the history of present illness.    All other systems reviewed and are negative.   Prior CV studies:   The following studies were reviewed today:   Echocardiogram 03/25/2021  1. Left ventricular ejection fraction, by estimation, is 50 to 55%. The  left ventricle has low normal function. The left ventricle has no regional  wall motion abnormalities. Left ventricular diastolic parameters are  consistent with Grade I diastolic  dysfunction (impaired relaxation).   2. Right ventricular systolic function is normal. The right ventricular  size is normal. There is mildly elevated pulmonary artery systolic  pressure. The estimated right ventricular systolic pressure is 55.7 mmHg.   3. Left atrial size was upper normal.   4. The mitral valve is grossly normal. Trivial mitral valve  regurgitation.   5. The aortic valve is tricuspid with some fusion of left and noncoronary  cusps. There is moderate calcification of the aortic valve. Aortic valve  regurgitation is not visualized. Moderate aortic valve stenosis. Aortic  valve mean gradient measures 10.5  mmHg. Dimentionless index 0.31.   6. The inferior vena cava is normal in size with greater than 50%  respiratory variability, suggesting right atrial pressure of 3 mmHg.     PCI  11/26/2017  CORONARY STENT INTERVENTION  LEFT HEART CATH AND CORONARY ANGIOGRAPHY   Conclusion    Ost Ramus to Ramus lesion is 100% stenosed. Mid RCA lesion is 90% stenosed. A drug-eluting stent was successfully placed using a STENT SYNERGY DES 2.5X16. Post intervention, there is a 0% residual stenosis. LV end diastolic pressure is normal.  1. 2 vessel obstructive CAD    - 100% proximal ramus intermediate at site of prior stent. I think this is a CTO and she has good collaterals.     - 90% ulcerative mid RCA 2. Normal LVEDP 3. Successful PCI of the mid RCA with DES   Plan: DAPT with ASA and Brilinta for one year.   Diagnostic Dominance: Right Intervention         Labs/Other Tests and Data Reviewed:    EKG:    Recent Labs: 05/01/2021: TSH 1.425 05/10/2021: Magnesium 1.7 10/01/2021: ALT 15; BUN 16; Creatinine, Ser 0.74; Hemoglobin 9.7; Platelets 197; Potassium 4.6; Sodium 142   Recent Lipid Panel No results found for: CHOL, TRIG, HDL, CHOLHDL, LDLCALC, LDLDIRECT  Wt Readings from Last 3 Encounters:  10/23/21 205 lb (93 kg)  05/01/21 198 lb (89.8 kg)  03/31/21 204 lb 9.4 oz (92.8 kg)     Risk Assessment/Calculations:    CHA2DS2-VASc Score = 5   This indicates a 7.2% annual risk of stroke. The patient's score is based upon: CHF History: 1 HTN History: 1 Diabetes History: 0 Stroke History: 0 Vascular Disease History: 1 Age Score: 1 Gender Score: 1        Objective:    Vital Signs:  BP (!) 148/70   Pulse 72   Ht 5' (1.524 m)   Wt 205 lb (93 kg)   SpO2 96%   BMI 40.04 kg/m    VITAL SIGNS:  reviewed GEN:    ASSESSMENT & PLAN:    1. Chronic systolic heart failure (HCC) Recent echocardiogram EF 50 to 55%.  No WMA's, G1 DD, mildly elevated PASP 45 mmHg.  Trivial mitral regurgitation, moderate aortic valve stenosis, mean gradient 10.5, DI 0.31.  Denies any SOB or DOE.  No PND no orthopnea.  Weight is stable.  2. Coronary artery disease involving  native coronary artery of native heart without angina pectoris Prior MRI with previous stent intervention.  Last PCI 11/26/2017.  Two-vessel obstructive disease, 100% proximal ramus intermedius at site of prior stent.  CTO and good collaterals.  Percent ulcerated mid RCA.  Successful PCI of mid RCA with DES.  Denies any anginal symptoms.  Continue aspirin 81 mg daily, Plavix 75 mg daily, sublingual nitro as needed, atorvastatin 80 mg p.o. daily.  3. PAF (paroxysmal atrial fibrillation) (HCC) CHA2DS2-VASc score 5.  Heart rate today 72  4. Primary hypertension BP 148/70 today.  She states she states her blood pressures are usually much better.  She is working with PT this evening and pain may be causing increases in her blood pressure.  Continue losartan 100 mg daily.  Continue Toprol-XL 100 mg daily.  Continue Norvasc 5 mg p.o. daily.           COVID-19 Education: The signs and symptoms of COVID-19 were discussed with the patient and how to seek care for testing (follow up with PCP or arrange E-visit).  The importance of social distancing was discussed today.  Time:   Today, I have spent 10 minutes with the patient with telehealth technology discussing the above problems.     Medication Adjustments/Labs and Tests Ordered: Current medicines are reviewed at length with the patient today.  Concerns regarding medicines are outlined above.   Tests Ordered: No orders of the defined types were placed in this encounter.   Medication Changes: No orders of the defined types were placed in this encounter.   Follow Up:   Her preference either virtual or in  person  in 6 month(s)  Signed, Verta Ellen, NP  10/23/2021 3:46 PM    [facility]

## 2021-10-28 ENCOUNTER — Other Ambulatory Visit (HOSPITAL_COMMUNITY): Payer: Self-pay

## 2021-10-28 DIAGNOSIS — Z170 Estrogen receptor positive status [ER+]: Secondary | ICD-10-CM

## 2021-10-28 NOTE — Progress Notes (Signed)
Standing order for CMP placed for patient's Xgeva injections.

## 2021-10-29 ENCOUNTER — Other Ambulatory Visit: Payer: Self-pay

## 2021-10-29 ENCOUNTER — Encounter (HOSPITAL_COMMUNITY): Payer: Self-pay

## 2021-10-29 ENCOUNTER — Inpatient Hospital Stay (HOSPITAL_COMMUNITY): Payer: Medicare Other

## 2021-10-29 VITALS — BP 146/64 | HR 65 | Temp 97.8°F | Resp 20

## 2021-10-29 DIAGNOSIS — C50911 Malignant neoplasm of unspecified site of right female breast: Secondary | ICD-10-CM

## 2021-10-29 DIAGNOSIS — C50411 Malignant neoplasm of upper-outer quadrant of right female breast: Secondary | ICD-10-CM

## 2021-10-29 DIAGNOSIS — Z5111 Encounter for antineoplastic chemotherapy: Secondary | ICD-10-CM | POA: Diagnosis not present

## 2021-10-29 LAB — COMPREHENSIVE METABOLIC PANEL
ALT: 14 U/L (ref 0–44)
AST: 20 U/L (ref 15–41)
Albumin: 3.2 g/dL — ABNORMAL LOW (ref 3.5–5.0)
Alkaline Phosphatase: 39 U/L (ref 38–126)
Anion gap: 5 (ref 5–15)
BUN: 21 mg/dL (ref 8–23)
CO2: 30 mmol/L (ref 22–32)
Calcium: 8.7 mg/dL — ABNORMAL LOW (ref 8.9–10.3)
Chloride: 105 mmol/L (ref 98–111)
Creatinine, Ser: 0.73 mg/dL (ref 0.44–1.00)
GFR, Estimated: >60 mL/min (ref >60)
Glucose, Bld: 113 mg/dL — ABNORMAL HIGH (ref 70–99)
Potassium: 4 mmol/L (ref 3.5–5.1)
Sodium: 140 mmol/L (ref 135–145)
Total Bilirubin: 0.8 mg/dL (ref 0.3–1.2)
Total Protein: 5.7 g/dL — ABNORMAL LOW (ref 6.5–8.1)

## 2021-10-29 MED ORDER — DENOSUMAB 120 MG/1.7ML ~~LOC~~ SOLN
120.0000 mg | Freq: Once | SUBCUTANEOUS | Status: AC
  Administered 2021-10-29: 120 mg via SUBCUTANEOUS

## 2021-10-29 MED ORDER — FULVESTRANT 250 MG/5ML IM SOSY
500.0000 mg | PREFILLED_SYRINGE | Freq: Once | INTRAMUSCULAR | Status: AC
  Administered 2021-10-29: 500 mg via INTRAMUSCULAR

## 2021-10-29 NOTE — Patient Instructions (Signed)
[facility]  Discharge Instructions: Thank you for choosing [facility] to provide your oncology and hematology care.  If you have a lab appointment with the [facility], please come in thru the Main Entrance and check in at the main information desk.  Wear comfortable clothing and clothing appropriate for easy access to any Portacath or PICC line.   We strive to give you quality time with your provider. You may need to reschedule your appointment if you arrive late (15 or more minutes).  Arriving late affects you and other patients whose appointments are after yours.  Also, if you miss three or more appointments without notifying the office, you may be dismissed from the clinic at the provider's discretion.      For prescription refill requests, have your pharmacy contact our office and allow 72 hours for refills to be completed.    Today you received the following chemotherapy and/or immunotherapy agents faslodex and x-geva given.      To help prevent nausea and vomiting after your treatment, we encourage you to take your nausea medication as directed.  BELOW ARE SYMPTOMS THAT SHOULD BE REPORTED IMMEDIATELY: *FEVER GREATER THAN 100.4 F (38 C) OR HIGHER *CHILLS OR SWEATING *NAUSEA AND VOMITING THAT IS NOT CONTROLLED WITH YOUR NAUSEA MEDICATION *UNUSUAL SHORTNESS OF BREATH *UNUSUAL BRUISING OR BLEEDING *URINARY PROBLEMS (pain or burning when urinating, or frequent urination) *BOWEL PROBLEMS (unusual diarrhea, constipation, pain near the anus) TENDERNESS IN MOUTH AND THROAT WITH OR WITHOUT PRESENCE OF ULCERS (sore throat, sores in mouth, or a toothache) UNUSUAL RASH, SWELLING OR PAIN  UNUSUAL VAGINAL DISCHARGE OR ITCHING   Items with * indicate a potential emergency and should be followed up as soon as possible or go to the Emergency Department if any problems should occur.  Please show the CHEMOTHERAPY ALERT CARD or IMMUNOTHERAPY ALERT CARD at check-in to the  Emergency Department and triage nurse.  Should you have questions after your visit or need to cancel or reschedule your appointment, please contact [facility] [phone]  and follow the prompts.  Office hours are 8:00 a.m. to 4:30 p.m. Monday - Friday. Please note that voicemails left after 4:00 p.m. may not be returned until the following business day.  We are closed weekends and major holidays. You have access to a nurse at all times for urgent questions. Please call the main number to the clinic [phone] and follow the prompts.  For any non-urgent questions, you may also contact your provider using MyChart. We now offer e-Visits for anyone 80 and older to request care online for non-urgent symptoms. For details visit [email or web address].   Also download the MyChart app! Go to the app store, search "MyChart", open the app, select [facility], and log in with your MyChart username and password.  Due to Covid, a mask is required upon entering the hospital/clinic. If you do not have a mask, one will be given to you upon arrival. For doctor visits, patients may have 1 support person aged 18 or older with them. For treatment visits, patients cannot have anyone with them due to current Covid guidelines and our immunocompromised population.

## 2021-10-29 NOTE — Progress Notes (Signed)
Pt here for monthly faslodex and x-geva.  Both last given on 10/01/21.  Calcium today is 8.7.  Vital signs WNL for treatment. Pt She is complaining of right lower quandrant pain in abdomen that wraps around to the back on the right side.  Only constipation to note.  Rates pain 8/10. Spoke with Dr Raliegh Ip and he advised to start stool softener - and to take Miralax daily until [facility] [employer] and if her pain has not resolved she needs to let her [facility] pain management Dr. know. Pt verbalized understanding.   Stacey [name] presents today for injection per the provider's orders.  X-geva and faslodex  administration without incident; injection site WNL; see MAR for injection details.  Patient tolerated procedure well and without incident.  No questions or complaints noted at this time. Stable during and after injection.  AVS reviewed.  Discharged in stable condition via EMS stretcher.

## 2021-11-08 ENCOUNTER — Telehealth: Payer: Medicare Other | Admitting: Cardiology

## 2021-11-25 NOTE — Progress Notes (Signed)
Stacey [name],  Stacey [name]   Patient Care Team: Stacey [name] as PCP - General (Internal Medicine) Stacey Bowie Alphonse Guild, MD as PCP - Cardiology (Cardiology) Stacey Mates, RN as Oncology Nurse Navigator (Oncology)  SUMMARY OF ONCOLOGIC HISTORY: Oncology History  Metastatic breast cancer ([place])  11/23/2017 Initial Diagnosis   Metastatic breast cancer ([place])   02/07/2021 Cancer Staging   Staging form: Breast, AJCC 8th Edition - Clinical stage from 02/07/2021: Stage IV (cT2, cN1, pM1, G1, ER+, PR+, HER2: Equivocal) - Signed by Derek Jack, MD on 02/07/2021 Histopathologic type: Infiltrating duct carcinoma, NOS Stage prefix: Initial diagnosis Histologic grading system: 3 grade system    02/07/2021 - 02/07/2021 Chemotherapy            CHIEF COMPLIANT: Follow-up of 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 Her last visit was on 09/03/2021.   Today she reports feeling well. She reports intermittent right sided abdominal pain which she rates as 7 out of 10 which is present only with movement; the pain is unchanged with eating or BM. She takes oxycodone TID. Her constipation is stable. She denies nausea and vomiting. Her appetite is good. She reports pain in her right hip and right leg.   REVIEW OF SYSTEMS:   Review of Systems  Constitutional:  Negative for appetite change and fatigue.  Gastrointestinal:  Positive for abdominal pain (7/10 R side) and constipation (stable). Negative for nausea and vomiting.  Musculoskeletal:  Positive for arthralgias (9/10 R leg, knee, and hip) and back pain (9/10).  Neurological:  Positive for dizziness and numbness (L leg).  All other systems reviewed and are negative.  I have reviewed the past medical history, past surgical history, social history and  family history with the patient and they are unchanged from previous note.   ALLERGIES:   is allergic to brilinta [ticagrelor], budesonide, and albuterol.   MEDICATIONS:  Current Outpatient Medications  Medication Sig Dispense Refill   acetaminophen (TYLENOL) 325 MG tablet Take 2 tablets (650 mg total) by mouth every 6 (six) hours as needed for mild pain, fever or headache. 12 tablet 0   amLODipine (NORVASC) 5 MG tablet Take 1 tablet (5 mg total) by mouth daily. 90 tablet 2   aspirin EC 81 MG tablet Take 1 tablet (81 mg total) by mouth daily with breakfast. Swallow whole. 30 tablet 11   atorvastatin (LIPITOR) 80 MG tablet TAKE 1 TABLET EVERY MORNING 90 tablet 3   b complex vitamins tablet Take 1 tablet by mouth every morning. 30 tablet 0   carboxymethylcellulose (REFRESH PLUS) 0.5 % SOLN Place 1 drop into both eyes in the morning, at noon, in the evening, and at bedtime.     clopidogrel (PLAVIX) 75 MG tablet TAKE 1 TABLET EVERY DAY 90 tablet 3   DULoxetine (CYMBALTA) 30 MG capsule Take 3 capsules (90 mg total) by mouth daily. (Patient taking differently: Take 30 mg by mouth daily.) 30 capsule 0   losartan (COZAAR) 100 MG tablet Take 1 tablet (100 mg total) by mouth daily. (Patient taking differently: Take 50 mg by mouth daily.) 90 tablet 3   metoprolol succinate (TOPROL-XL) 100 MG 24 hr tablet TAKE 1 AND 1/2 TABLETS DAILY (DOSE INCREASE) 135 tablet 2   Multiple Vitamin (MULTIVITAMIN ADULT PO) Take 1 tablet by mouth daily.  Multiple Vitamins-Minerals (EQ VISION FORMULA 50+ PO) Take 1 tablet by mouth every morning.     Oxycodone HCl 10 MG TABS Take by mouth.     PAIN MANAGEMENT INTRATHECAL, IT, PUMP by Intrathecal route.     polyethylene glycol powder (GLYCOLAX/MIRALAX) 17 GM/SCOOP powder Take 17 g by mouth daily as needed for moderate constipation.     pregabalin (LYRICA) 50 MG capsule Take 50 mg by mouth 3 (three) times daily.     nitroGLYCERIN (NITROSTAT) 0.4 MG SL tablet Place 1 tablet  (0.4 mg total) under the tongue every 5 (five) minutes x 3 doses as needed for chest pain (if no relief after 3rd dose, proceed to the ED or call 911). (Patient not taking: Reported on 11/26/2021) 25 tablet 3   No current facility-administered medications for this visit.     PHYSICAL EXAMINATION: Performance status (ECOG): 4 - Bedbound  Vitals:   11/26/21 1159  BP: (!) 146/62  Pulse: 71  Resp: 17  Temp: (!) 96.9 F (36.1 C)  SpO2: 100%   Wt Readings from Last 3 Encounters:  10/23/21 205 lb (93 kg)  05/01/21 198 lb (89.8 kg)  03/31/21 204 lb 9.4 oz (92.8 kg)   Physical Exam Vitals reviewed.  Constitutional:      Appearance: Normal appearance.  Cardiovascular:     Rate and Rhythm: Normal rate and regular rhythm.     Pulses: Normal pulses.     Heart sounds: Normal heart sounds.  Pulmonary:     Effort: Pulmonary effort is normal.     Breath sounds: Normal breath sounds.  Abdominal:     Palpations: Abdomen is soft.     Tenderness: There is abdominal tenderness in the right lower quadrant.  Neurological:     General: No focal deficit present.     Mental Status: She is alert and oriented to person, place, and time.  Psychiatric:        Mood and Affect: Mood normal.        Behavior: Behavior normal.    Breast Exam Chaperone: Stacey [name]     LABORATORY DATA:  I have reviewed the data as listed CMP Latest Ref Rng & Units 11/26/2021 10/29/2021 10/01/2021  Glucose 70 - 99 mg/dL 134(H) 113(H) 123(H)  BUN 8 - 23 mg/dL '21 21 16  ' Creatinine 0.44 - 1.00 mg/dL 0.73 0.73 0.74  Sodium 135 - 145 mmol/L 142 140 142  Potassium 3.5 - 5.1 mmol/L 4.9 4.0 4.6  Chloride 98 - 111 mmol/L 104 105 105  CO2 22 - 32 mmol/L 33(H) 30 32  Calcium 8.9 - 10.3 mg/dL 9.1 8.7(L) 9.1  Total Protein 6.5 - 8.1 g/dL 5.9(L) 5.7(L) 6.2(L)  Total Bilirubin 0.3 - 1.2 mg/dL 0.4 0.8 1.0  Alkaline Phos 38 - 126 U/L 42 39 48  AST 15 - 41 U/L '19 20 21  ' ALT 0 - 44 U/L '15 14 15   ' Lab Results  Component  Value Date   CAN153 20.4 10/01/2021   CAN153 17.1 02/07/2021   Lab Results  Component Value Date   WBC 3.9 (L) 10/01/2021   HGB 9.7 (L) 10/01/2021   HCT 29.8 (L) 10/01/2021   MCV 105.3 (H) 10/01/2021   PLT 197 10/01/2021   NEUTROABS 2.4 10/01/2021    ASSESSMENT:  1.  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 -Ultrasound-guided biopsy on 01/07/2016 showed grade 1 invasive ductal carcinoma, ER positive, PR positive, HER-2 equivocal by FISH, right axillary lymph node with metastatic adenocarcinoma.  HER-2 equivocal since her 2/CEP17 ratio less than 2 and average copy number of her 2 is greater than or equal to 4 but less than 6 signals per cell. -Bone biopsy on 05/30/2016 consistent with metastatic breast cancer. -Palliative therapy with letrozole started under the direction of Dr. Deitra Mayo at [facility] from 02/25/2016 through February 2020, progressive symptoms with increased soft tissue extension at L2. 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 -Addition of Leslee Home was discussed but she was concerned about cost of the medication. -CT abdomen and pelvis on 08/13/2020 shows acute compression fracture deformity of the L1 vertebral body and other chronic findings. -Her last Faslodex injection at [facility] was on 11/06/2020. -CT CAP on 02/19/2021 with no evidence of for solid organ metastasis or nodal metastasis.  Right breast lesion is stable.  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 -Bone scan on 02/19/2021 was negative for bone mets.   2.  Social/family history: -She worked for [employer] prior to retirement in 2010. -She is currently living with her son who is helping with her day-to-day activities.  Daughter lives in  [place].  She quit smoking 35 to 40 years ago. -She is mostly confined to bed.  However she is able to change clothes, able to wash upper body, halfway dress herself.  She cannot stand up for the past 2 years because of weakness in the legs and knee arthritis.  She wears adult diapers.   PLAN:  1.  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  Stable sclerotic appearance of the left inferior pubic ramus and L2 vertebral body and L1 compression deformity.  2 cm heterogeneous lesion in the left kidney.  Bone scan did not show clear metastatic disease. - Last tumor markers were normal on 10/01/2021. - Continue Faslodex today and once a month. - Would obtain CT scan of the abdomen and pelvis because of new onset pains. - We will do telephone visit after the scan.  She will follow-up in 3 months with repeat tumor markers and other labs.   2.  Cancer associated pain: - She has compression fracture of L1/L2.  She has pain in the back, right hip and right leg.  She follows up with [facility] clinic at [facility].  She has intrathecal pump filled with morphine. - Continue oxycodone 10 mg 3-4 times daily for breakthrough pain.  3.  Right lower quadrant abdominal pain: - She reported right lower quadrant abdominal pain started last week, 7 out of 10.  No change in bowels.  No vomiting.  Pain is worse on sitting and standing and lasts until she lies back down. - There is fullness in the right lower quadrant below her pain pump. - Would recommend imaging with CT scan of the abdomen and pelvis.  4.  Bone metastasis: -Calcium is 9.1.  Continue denosumab monthly.  5.  Macrocytic anemia: - Macrocytic anemia is stable.  Hemoglobin 9.7 with MCV 105.3. - Ferritin was 67 and percent saturation 20.  Creatinine is normal.  Breast Cancer therapy associated bone loss: I have  recommended calcium, Vitamin D and weight bearing exercises.  Orders placed this encounter:  No orders of the defined types were placed in this encounter.   The patient has a good understanding of the overall plan. She agrees with it. She will call with any problems that may develop before the next visit here.  Derek Jack, MD [employer] [phone]   I, Stacey [name], am acting as a scribe for Dr. Derek Jack.  I, Derek Jack MD, have reviewed the above documentation for accuracy and completeness, and I agree with the above.

## 2021-11-26 ENCOUNTER — Inpatient Hospital Stay (HOSPITAL_BASED_OUTPATIENT_CLINIC_OR_DEPARTMENT_OTHER): Payer: Medicare Other | Admitting: Hematology

## 2021-11-26 ENCOUNTER — Inpatient Hospital Stay (HOSPITAL_COMMUNITY): Payer: Medicare Other | Attending: Hematology

## 2021-11-26 ENCOUNTER — Inpatient Hospital Stay (HOSPITAL_COMMUNITY): Payer: Medicare Other

## 2021-11-26 ENCOUNTER — Other Ambulatory Visit: Payer: Self-pay

## 2021-11-26 VITALS — BP 146/62 | HR 71 | Temp 96.9°F | Resp 17

## 2021-11-26 DIAGNOSIS — C773 Secondary and unspecified malignant neoplasm of axilla and upper limb lymph nodes: Secondary | ICD-10-CM | POA: Diagnosis not present

## 2021-11-26 DIAGNOSIS — Z170 Estrogen receptor positive status [ER+]: Secondary | ICD-10-CM

## 2021-11-26 DIAGNOSIS — C50411 Malignant neoplasm of upper-outer quadrant of right female breast: Secondary | ICD-10-CM | POA: Insufficient documentation

## 2021-11-26 DIAGNOSIS — Z5111 Encounter for antineoplastic chemotherapy: Secondary | ICD-10-CM | POA: Diagnosis present

## 2021-11-26 DIAGNOSIS — C50911 Malignant neoplasm of unspecified site of right female breast: Secondary | ICD-10-CM

## 2021-11-26 DIAGNOSIS — C7951 Secondary malignant neoplasm of bone: Secondary | ICD-10-CM | POA: Diagnosis not present

## 2021-11-26 DIAGNOSIS — D649 Anemia, unspecified: Secondary | ICD-10-CM | POA: Diagnosis not present

## 2021-11-26 DIAGNOSIS — R1031 Right lower quadrant pain: Secondary | ICD-10-CM | POA: Diagnosis not present

## 2021-11-26 LAB — COMPREHENSIVE METABOLIC PANEL
ALT: 15 U/L (ref 0–44)
AST: 19 U/L (ref 15–41)
Albumin: 3.3 g/dL — ABNORMAL LOW (ref 3.5–5.0)
Alkaline Phosphatase: 42 U/L (ref 38–126)
Anion gap: 5 (ref 5–15)
BUN: 21 mg/dL (ref 8–23)
CO2: 33 mmol/L — ABNORMAL HIGH (ref 22–32)
Calcium: 9.1 mg/dL (ref 8.9–10.3)
Chloride: 104 mmol/L (ref 98–111)
Creatinine, Ser: 0.73 mg/dL (ref 0.44–1.00)
GFR, Estimated: >60 mL/min (ref >60)
Glucose, Bld: 134 mg/dL — ABNORMAL HIGH (ref 70–99)
Potassium: 4.9 mmol/L (ref 3.5–5.1)
Sodium: 142 mmol/L (ref 135–145)
Total Bilirubin: 0.4 mg/dL (ref 0.3–1.2)
Total Protein: 5.9 g/dL — ABNORMAL LOW (ref 6.5–8.1)

## 2021-11-26 MED ORDER — DENOSUMAB 120 MG/1.7ML ~~LOC~~ SOLN
120.0000 mg | Freq: Once | SUBCUTANEOUS | Status: AC
  Administered 2021-11-26: 15:00:00 120 mg via SUBCUTANEOUS
  Filled 2021-11-26: qty 1.7

## 2021-11-26 MED ORDER — FULVESTRANT 250 MG/5ML IM SOSY
500.0000 mg | PREFILLED_SYRINGE | Freq: Once | INTRAMUSCULAR | Status: AC
  Administered 2021-11-26: 15:00:00 500 mg via INTRAMUSCULAR
  Filled 2021-11-26: qty 10

## 2021-11-26 NOTE — Patient Instructions (Signed)
[facility]  Discharge Instructions: Thank you for choosing [facility] to provide your oncology and hematology care.  If you have a lab appointment with the [facility], please come in thru the Main Entrance and check in at the main information desk.  Wear comfortable clothing and clothing appropriate for easy access to any Portacath or PICC line.   We strive to give you quality time with your provider. You may need to reschedule your appointment if you arrive late (15 or more minutes).  Arriving late affects you and other patients whose appointments are after yours.  Also, if you miss three or more appointments without notifying the office, you may be dismissed from the clinic at the providers discretion.      For prescription refill requests, have your pharmacy contact our office and allow 72 hours for refills to be completed.    Today you received Xgeva/Faslodex.     BELOW ARE SYMPTOMS THAT SHOULD BE REPORTED IMMEDIATELY: *FEVER GREATER THAN 100.4 F (38 C) OR HIGHER *CHILLS OR SWEATING *NAUSEA AND VOMITING THAT IS NOT CONTROLLED WITH YOUR NAUSEA MEDICATION *UNUSUAL SHORTNESS OF BREATH *UNUSUAL BRUISING OR BLEEDING *URINARY PROBLEMS (pain or burning when urinating, or frequent urination) *BOWEL PROBLEMS (unusual diarrhea, constipation, pain near the anus) TENDERNESS IN MOUTH AND THROAT WITH OR WITHOUT PRESENCE OF ULCERS (sore throat, sores in mouth, or a toothache) UNUSUAL RASH, SWELLING OR PAIN  UNUSUAL VAGINAL DISCHARGE OR ITCHING   Items with * indicate a potential emergency and should be followed up as soon as possible or go to the Emergency Department if any problems should occur.  Please show the CHEMOTHERAPY ALERT CARD or IMMUNOTHERAPY ALERT CARD at check-in to the Emergency Department and triage nurse.  Should you have questions after your visit or need to cancel or reschedule your appointment, please contact [facility]  [phone]  and follow the prompts.  Office hours are 8:00 a.m. to 4:30 p.m. Monday - Friday. Please note that voicemails left after 4:00 p.m. may not be returned until the following business day.  We are closed weekends and major holidays. You have access to a nurse at all times for urgent questions. Please call the main number to the clinic [phone] and follow the prompts.  For any non-urgent questions, you may also contact your provider using MyChart. We now offer e-Visits for anyone 25 and older to request care online for non-urgent symptoms. For details visit [email or web address].   Also download the MyChart app! Go to the app store, search "MyChart", open the app, select [facility], and log in with your MyChart username and password.  Due to Covid, a mask is required upon entering the hospital/clinic. If you do not have a mask, one will be given to you upon arrival. For doctor visits, patients may have 1 support person aged 72 or older with them. For treatment visits, patients cannot have anyone with them due to current Covid guidelines and our immunocompromised population.

## 2021-11-26 NOTE — Patient Instructions (Signed)
[place] at [facility] Discharge Instructions   You were seen and examined today by Dr. Delton Coombes. He reviewed your lab results, which are normal/stable.  You will receive Faslodex and Xgeva injections today. Return as scheduled for lab work and injections.  Return as scheduled for office visit with Dr. Raliegh Ip .    Thank you for choosing [facility] at [facility] to provide your oncology and hematology care.  To afford each patient quality time with our provider, please arrive at least 15 minutes before your scheduled appointment time.   If you have a lab appointment with the [facility] please come in thru the Main Entrance and check in at the main information desk.  You need to re-schedule your appointment should you arrive 10 or more minutes late.  We strive to give you quality time with our providers, and arriving late affects you and other patients whose appointments are after yours.  Also, if you no show three or more times for appointments you may be dismissed from the clinic at the providers discretion.     Again, thank you for choosing [facility].  Our hope is that these requests will decrease the amount of time that you wait before being seen by our physicians.       _____________________________________________________________  Should you have questions after your visit to [facility], please contact our office at [phone] and follow the prompts.  Our office hours are 8:00 a.m. and 4:30 p.m. Monday - Friday.  Please note that voicemails left after 4:00 p.m. may not be returned until the following business day.  We are closed weekends and major holidays.  You do have access to a nurse 24-7, just call the main number to the clinic [phone] and do not press any options, hold on the line and a nurse will answer the phone.    For prescription refill requests, have your pharmacy contact our office and allow  72 hours.    Due to Covid, you will need to wear a mask upon entering the hospital. If you do not have a mask, a mask will be given to you at the Main Entrance upon arrival. For doctor visits, patients may have 1 support person age 76 or older with them. For treatment visits, patients can not have anyone with them due to social distancing guidelines and our immunocompromised population.

## 2021-11-26 NOTE — Progress Notes (Signed)
Stacey [name] presents today for monthly injections Xgeva/Faslodex per the provider's orders. Calcium 9.1 today  Xgeva/Faslodex administration without incident; injection site WNL; see MAR for injection details.  Patient tolerated procedure well and without incident.  No questions or complaints noted at this time.  Xgeva/Faslodex given today per MD orders. Tolerated infusion without adverse affects. Vital signs stable. No complaints at this time. Discharged from clinic on stretcher by  [facility] transport in stable condition. Alert and oriented x 3. F/U with [facility] as scheduled.

## 2021-12-05 ENCOUNTER — Telehealth (HOSPITAL_COMMUNITY): Payer: Self-pay | Admitting: *Deleted

## 2021-12-05 NOTE — Telephone Encounter (Signed)
Verbal order requested for in home SW and OT from Marcella Dubs, RN with [facility].  Orders verbally approved.

## 2021-12-13 ENCOUNTER — Other Ambulatory Visit: Payer: Self-pay

## 2021-12-13 ENCOUNTER — Ambulatory Visit (HOSPITAL_COMMUNITY)
Admission: RE | Admit: 2021-12-13 | Discharge: 2021-12-13 | Disposition: A | Discharge status: Home / Self Care | Payer: Medicare Other | Source: Ambulatory Visit | Attending: Hematology | Admitting: Hematology

## 2021-12-13 DIAGNOSIS — R1031 Right lower quadrant pain: Secondary | ICD-10-CM | POA: Insufficient documentation

## 2021-12-13 MED ORDER — IOHEXOL 300 MG/ML  SOLN
100.0000 mL | Freq: Once | INTRAMUSCULAR | Status: AC | PRN
  Administered 2021-12-13: 100 mL via INTRAVENOUS

## 2021-12-18 ENCOUNTER — Other Ambulatory Visit: Payer: Self-pay

## 2021-12-18 ENCOUNTER — Inpatient Hospital Stay (HOSPITAL_COMMUNITY): Payer: Medicare Other | Attending: Hematology | Admitting: Hematology

## 2021-12-18 DIAGNOSIS — Z5111 Encounter for antineoplastic chemotherapy: Secondary | ICD-10-CM | POA: Insufficient documentation

## 2021-12-18 DIAGNOSIS — C7951 Secondary malignant neoplasm of bone: Secondary | ICD-10-CM | POA: Insufficient documentation

## 2021-12-18 DIAGNOSIS — Z170 Estrogen receptor positive status [ER+]: Secondary | ICD-10-CM | POA: Insufficient documentation

## 2021-12-18 DIAGNOSIS — C50911 Malignant neoplasm of unspecified site of right female breast: Secondary | ICD-10-CM | POA: Insufficient documentation

## 2021-12-18 NOTE — Progress Notes (Signed)
Virtual Visit via Telephone Note  I connected with [name] on 12/18/21 at  4:00 PM EST by telephone and verified that I am speaking with the correct person using two identifiers.  Location: Patient: At home Provider: In the office   I discussed the limitations, risks, security and privacy concerns of performing an evaluation and management service by telephone and the availability of in person appointments. I also discussed with the patient that there may be a patient responsible charge related to this service. The patient expressed understanding and agreed to proceed.   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  She is being treated with Faslodex injections monthly.   Observations/Objective: At last visit she complained of right lower quadrant abdominal pain which is worse on sitting and standing and lasts until she lies down.  She did not have any changes in her bowels.  Hence we have ordered a CT scan of abdomen and pelvis.  Today she reports that worsening pain in the right lower extremity including right knee, hip region.  Assessment and Plan:  1.  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  Stable 2.3 cm left renal mass suspicious for renal cell carcinoma.  Old compression fracture of L1 vertebral body is also unchanged.  Stable sclerotic bone lesions of L2 and L4 vertebral bodies.  Colonic diverticulosis with no evidence of diverticulitis. -She was told to follow-up with pain clinic at [facility]. - She will keep her follow-up appointment in 3 months.   Follow Up Instructions:  Please keep the follow-up appointment in 3 months. I discussed the assessment and treatment plan with the patient. The patient was provided an opportunity to  ask questions and all were answered. The patient agreed with the plan and demonstrated an understanding of the instructions.   The patient was advised to call back or seek an in-person evaluation if the symptoms worsen or if the condition fails to improve as anticipated.  I provided 21 minutes of non-face-to-face time during this encounter.   Derek Jack, MD

## 2021-12-24 ENCOUNTER — Inpatient Hospital Stay (HOSPITAL_COMMUNITY): Payer: Medicare Other

## 2021-12-24 ENCOUNTER — Other Ambulatory Visit: Payer: Self-pay

## 2021-12-24 ENCOUNTER — Encounter (HOSPITAL_COMMUNITY): Payer: Self-pay

## 2021-12-24 VITALS — BP 138/65 | HR 68 | Temp 98.8°F | Resp 18

## 2021-12-24 DIAGNOSIS — Z5111 Encounter for antineoplastic chemotherapy: Secondary | ICD-10-CM | POA: Diagnosis present

## 2021-12-24 DIAGNOSIS — Z170 Estrogen receptor positive status [ER+]: Secondary | ICD-10-CM

## 2021-12-24 DIAGNOSIS — C7951 Secondary malignant neoplasm of bone: Secondary | ICD-10-CM | POA: Diagnosis present

## 2021-12-24 DIAGNOSIS — C50911 Malignant neoplasm of unspecified site of right female breast: Secondary | ICD-10-CM | POA: Diagnosis present

## 2021-12-24 LAB — COMPREHENSIVE METABOLIC PANEL
ALT: 15 U/L (ref 0–44)
AST: 19 U/L (ref 15–41)
Albumin: 3.5 g/dL (ref 3.5–5.0)
Alkaline Phosphatase: 41 U/L (ref 38–126)
Anion gap: 7 (ref 5–15)
BUN: 19 mg/dL (ref 8–23)
CO2: 32 mmol/L (ref 22–32)
Calcium: 9 mg/dL (ref 8.9–10.3)
Chloride: 103 mmol/L (ref 98–111)
Creatinine, Ser: 0.66 mg/dL (ref 0.44–1.00)
GFR, Estimated: >60 mL/min (ref >60)
Glucose, Bld: 102 mg/dL — ABNORMAL HIGH (ref 70–99)
Potassium: 4.7 mmol/L (ref 3.5–5.1)
Sodium: 142 mmol/L (ref 135–145)
Total Bilirubin: 1.1 mg/dL (ref 0.3–1.2)
Total Protein: 6.1 g/dL — ABNORMAL LOW (ref 6.5–8.1)

## 2021-12-24 MED ORDER — FULVESTRANT 250 MG/5ML IM SOSY
500.0000 mg | PREFILLED_SYRINGE | Freq: Once | INTRAMUSCULAR | Status: AC
  Administered 2021-12-24: 15:00:00 500 mg via INTRAMUSCULAR
  Filled 2021-12-24: qty 10

## 2021-12-24 MED ORDER — DENOSUMAB 120 MG/1.7ML ~~LOC~~ SOLN
120.0000 mg | Freq: Once | SUBCUTANEOUS | Status: AC
  Administered 2021-12-24: 15:00:00 120 mg via SUBCUTANEOUS
  Filled 2021-12-24: qty 1.7

## 2021-12-24 NOTE — Patient Instructions (Signed)
Stacey [name]  Discharge Instructions: Thank you for choosing [facility] to provide your oncology and hematology care.  If you have a lab appointment with the [facility], please come in thru the Main Entrance and check in at the main information desk.  Wear comfortable clothing and clothing appropriate for easy access to any Portacath or PICC line.   We strive to give you quality time with your provider. You may need to reschedule your appointment if you arrive late (15 or more minutes).  Arriving late affects you and other patients whose appointments are after yours.  Also, if you miss three or more appointments without notifying the office, you may be dismissed from the clinic at the providers discretion.      For prescription refill requests, have your pharmacy contact our office and allow 72 hours for refills to be completed.    Today you received the following chemotherapy and/or immunotherapy agents faslodex and xgeva.      To help prevent nausea and vomiting after your treatment, we encourage you to take your nausea medication as directed.  BELOW ARE SYMPTOMS THAT SHOULD BE REPORTED IMMEDIATELY: *FEVER GREATER THAN 100.4 F (38 C) OR HIGHER *CHILLS OR SWEATING *NAUSEA AND VOMITING THAT IS NOT CONTROLLED WITH YOUR NAUSEA MEDICATION *UNUSUAL SHORTNESS OF BREATH *UNUSUAL BRUISING OR BLEEDING *URINARY PROBLEMS (pain or burning when urinating, or frequent urination) *BOWEL PROBLEMS (unusual diarrhea, constipation, pain near the anus) TENDERNESS IN MOUTH AND THROAT WITH OR WITHOUT PRESENCE OF ULCERS (sore throat, sores in mouth, or a toothache) UNUSUAL RASH, SWELLING OR PAIN  UNUSUAL VAGINAL DISCHARGE OR ITCHING   Items with * indicate a potential emergency and should be followed up as soon as possible or go to the Emergency Department if any problems should occur.  Please show the CHEMOTHERAPY ALERT CARD or IMMUNOTHERAPY ALERT CARD at check-in to the  Emergency Department and triage nurse.  Should you have questions after your visit or need to cancel or reschedule your appointment, please contact [facility] [phone]  and follow the prompts.  Office hours are 8:00 a.m. to 4:30 p.m. Monday - Friday. Please note that voicemails left after 4:00 p.m. may not be returned until the following business day.  We are closed weekends and major holidays. You have access to a nurse at all times for urgent questions. Please call the main number to the clinic [phone] and follow the prompts.  For any non-urgent questions, you may also contact your provider using MyChart. We now offer e-Visits for anyone 75 and older to request care online for non-urgent symptoms. For details visit [email or web address].   Also download the MyChart app! Go to the app store, search "MyChart", open the app, select [facility], and log in with your MyChart username and password.  Due to Covid, a mask is required upon entering the hospital/clinic. If you do not have a mask, one will be given to you upon arrival. For doctor visits, patients may have 1 support person aged 3 or older with them. For treatment visits, patients cannot have anyone with them due to current Covid guidelines and our immunocompromised population.

## 2021-12-24 NOTE — Progress Notes (Signed)
Faslodex and Xgeva given today. Patient taking calcium as directed.  Denied tooth, jaw, and leg pain.  No recent or upcoming dental visits.  Labs reviewed.  Patient tolerated injection with no complaints voiced.  See MAR for details.  Patient stable during and after injection.  Site clean and dry with no bruising or swelling noted.  Band aid applied.  Vss with discharge and left in satisfactory condition with no s/s of distress noted.

## 2022-01-21 ENCOUNTER — Inpatient Hospital Stay (HOSPITAL_COMMUNITY): Payer: Medicare Other | Attending: Hematology

## 2022-01-21 ENCOUNTER — Other Ambulatory Visit: Payer: Self-pay

## 2022-01-21 ENCOUNTER — Encounter (HOSPITAL_COMMUNITY): Payer: Self-pay

## 2022-01-21 ENCOUNTER — Inpatient Hospital Stay (HOSPITAL_COMMUNITY): Payer: Medicare Other

## 2022-01-21 VITALS — BP 140/54 | HR 66 | Temp 96.9°F | Resp 17 | Ht 60.0 in

## 2022-01-21 DIAGNOSIS — C50911 Malignant neoplasm of unspecified site of right female breast: Secondary | ICD-10-CM | POA: Diagnosis present

## 2022-01-21 DIAGNOSIS — Z5111 Encounter for antineoplastic chemotherapy: Secondary | ICD-10-CM | POA: Insufficient documentation

## 2022-01-21 DIAGNOSIS — C50411 Malignant neoplasm of upper-outer quadrant of right female breast: Secondary | ICD-10-CM

## 2022-01-21 DIAGNOSIS — C7951 Secondary malignant neoplasm of bone: Secondary | ICD-10-CM | POA: Diagnosis present

## 2022-01-21 DIAGNOSIS — Z170 Estrogen receptor positive status [ER+]: Secondary | ICD-10-CM | POA: Insufficient documentation

## 2022-01-21 LAB — COMPREHENSIVE METABOLIC PANEL
ALT: 15 U/L (ref 0–44)
AST: 19 U/L (ref 15–41)
Albumin: 3.2 g/dL — ABNORMAL LOW (ref 3.5–5.0)
Alkaline Phosphatase: 39 U/L (ref 38–126)
Anion gap: 4 — ABNORMAL LOW (ref 5–15)
BUN: 19 mg/dL (ref 8–23)
CO2: 32 mmol/L (ref 22–32)
Calcium: 9.2 mg/dL (ref 8.9–10.3)
Chloride: 104 mmol/L (ref 98–111)
Creatinine, Ser: 0.67 mg/dL (ref 0.44–1.00)
GFR, Estimated: >60 mL/min (ref >60)
Glucose, Bld: 101 mg/dL — ABNORMAL HIGH (ref 70–99)
Potassium: 4.4 mmol/L (ref 3.5–5.1)
Sodium: 140 mmol/L (ref 135–145)
Total Bilirubin: 1 mg/dL (ref 0.3–1.2)
Total Protein: 5.6 g/dL — ABNORMAL LOW (ref 6.5–8.1)

## 2022-01-21 MED ORDER — DENOSUMAB 120 MG/1.7ML ~~LOC~~ SOLN
120.0000 mg | Freq: Once | SUBCUTANEOUS | Status: AC
  Administered 2022-01-21: 120 mg via SUBCUTANEOUS
  Filled 2022-01-21: qty 1.7

## 2022-01-21 MED ORDER — FULVESTRANT 250 MG/5ML IM SOSY
500.0000 mg | PREFILLED_SYRINGE | Freq: Once | INTRAMUSCULAR | Status: AC
  Administered 2022-01-21: 500 mg via INTRAMUSCULAR
  Filled 2022-01-21: qty 10

## 2022-01-21 NOTE — Progress Notes (Signed)
Patient tolerated Faslodex injection with no complaints voiced. Bilateral sites clean and dry with no bruising or swelling noted. Band aids applied. See MAR for details. Patient stable during and after injections. Patient taking calcium as directed. Denied tooth, jaw, and leg pain. No recent or upcoming dental visits. Labs reviewed. Patient tolerated injection with no complaints voiced. See MAR for details. Patient stable during and after injection. Site clean and dry with no bruising or swelling noted. Band aid applied. Vss with discharge and left in satisfactory condition via [facility] transportation with no s/s of distress.

## 2022-01-21 NOTE — Patient Instructions (Signed)
Stacey [name]  Discharge Instructions: Thank you for choosing [facility] to provide your oncology and hematology care.  If you have a lab appointment with the [facility], please come in thru the Main Entrance and check in at the main information desk.  Wear comfortable clothing and clothing appropriate for easy access to any Portacath or PICC line.   We strive to give you quality time with your provider. You may need to reschedule your appointment if you arrive late (15 or more minutes).  Arriving late affects you and other patients whose appointments are after yours.  Also, if you miss three or more appointments without notifying the office, you may be dismissed from the clinic at the providers discretion.      For prescription refill requests, have your pharmacy contact our office and allow 72 hours for refills to be completed.    Today you received the following Xgeva, and Faslodex, return as scheduled.   To help prevent nausea and vomiting after your treatment, we encourage you to take your nausea medication as directed.  BELOW ARE SYMPTOMS THAT SHOULD BE REPORTED IMMEDIATELY: *FEVER GREATER THAN 100.4 F (38 C) OR HIGHER *CHILLS OR SWEATING *NAUSEA AND VOMITING THAT IS NOT CONTROLLED WITH YOUR NAUSEA MEDICATION *UNUSUAL SHORTNESS OF BREATH *UNUSUAL BRUISING OR BLEEDING *URINARY PROBLEMS (pain or burning when urinating, or frequent urination) *BOWEL PROBLEMS (unusual diarrhea, constipation, pain near the anus) TENDERNESS IN MOUTH AND THROAT WITH OR WITHOUT PRESENCE OF ULCERS (sore throat, sores in mouth, or a toothache) UNUSUAL RASH, SWELLING OR PAIN  UNUSUAL VAGINAL DISCHARGE OR ITCHING   Items with * indicate a potential emergency and should be followed up as soon as possible or go to the Emergency Department if any problems should occur.  Please show the CHEMOTHERAPY ALERT CARD or IMMUNOTHERAPY ALERT CARD at check-in to the Emergency Department and  triage nurse.  Should you have questions after your visit or need to cancel or reschedule your appointment, please contact [facility] [phone]  and follow the prompts.  Office hours are 8:00 a.m. to 4:30 p.m. Monday - Friday. Please note that voicemails left after 4:00 p.m. may not be returned until the following business day.  We are closed weekends and major holidays. You have access to a nurse at all times for urgent questions. Please call the main number to the clinic [phone] and follow the prompts.  For any non-urgent questions, you may also contact your provider using MyChart. We now offer e-Visits for anyone 41 and older to request care online for non-urgent symptoms. For details visit [email or web address].   Also download the MyChart app! Go to the app store, search "MyChart", open the app, select [facility], and log in with your MyChart username and password.  Due to Covid, a mask is required upon entering the hospital/clinic. If you do not have a mask, one will be given to you upon arrival. For doctor visits, patients may have 1 support person aged 68 or older with them. For treatment visits, patients cannot have anyone with them due to current Covid guidelines and our immunocompromised population.

## 2022-01-26 ENCOUNTER — Other Ambulatory Visit: Payer: Self-pay | Admitting: Cardiology

## 2022-02-18 ENCOUNTER — Other Ambulatory Visit: Payer: Self-pay

## 2022-02-18 ENCOUNTER — Inpatient Hospital Stay (HOSPITAL_COMMUNITY): Payer: Medicare Other

## 2022-02-18 ENCOUNTER — Inpatient Hospital Stay (HOSPITAL_COMMUNITY): Payer: Medicare Other | Attending: Hematology

## 2022-02-18 ENCOUNTER — Encounter (HOSPITAL_COMMUNITY): Payer: Self-pay

## 2022-02-18 VITALS — BP 131/67 | HR 71 | Temp 98.6°F | Resp 18

## 2022-02-18 DIAGNOSIS — Z170 Estrogen receptor positive status [ER+]: Secondary | ICD-10-CM | POA: Diagnosis not present

## 2022-02-18 DIAGNOSIS — C7951 Secondary malignant neoplasm of bone: Secondary | ICD-10-CM | POA: Diagnosis present

## 2022-02-18 DIAGNOSIS — Z5111 Encounter for antineoplastic chemotherapy: Secondary | ICD-10-CM | POA: Insufficient documentation

## 2022-02-18 DIAGNOSIS — C50911 Malignant neoplasm of unspecified site of right female breast: Secondary | ICD-10-CM | POA: Insufficient documentation

## 2022-02-18 LAB — COMPREHENSIVE METABOLIC PANEL
ALT: 13 U/L (ref 0–44)
AST: 19 U/L (ref 15–41)
Albumin: 3.3 g/dL — ABNORMAL LOW (ref 3.5–5.0)
Alkaline Phosphatase: 38 U/L (ref 38–126)
Anion gap: 4 — ABNORMAL LOW (ref 5–15)
BUN: 21 mg/dL (ref 8–23)
CO2: 33 mmol/L — ABNORMAL HIGH (ref 22–32)
Calcium: 8.8 mg/dL — ABNORMAL LOW (ref 8.9–10.3)
Chloride: 102 mmol/L (ref 98–111)
Creatinine, Ser: 0.7 mg/dL (ref 0.44–1.00)
GFR, Estimated: >60 mL/min (ref >60)
Glucose, Bld: 138 mg/dL — ABNORMAL HIGH (ref 70–99)
Potassium: 4.5 mmol/L (ref 3.5–5.1)
Sodium: 139 mmol/L (ref 135–145)
Total Bilirubin: 1 mg/dL (ref 0.3–1.2)
Total Protein: 6 g/dL — ABNORMAL LOW (ref 6.5–8.1)

## 2022-02-18 LAB — CBC WITH DIFFERENTIAL/PLATELET
Abs Immature Granulocytes: 0.01 10*3/uL (ref 0.00–0.07)
Basophils Absolute: 0 10*3/uL (ref 0.0–0.1)
Basophils Relative: 1 %
Eosinophils Absolute: 0.2 10*3/uL (ref 0.0–0.5)
Eosinophils Relative: 5 %
HCT: 30.8 % — ABNORMAL LOW (ref 36.0–46.0)
Hemoglobin: 9.7 g/dL — ABNORMAL LOW (ref 12.0–15.0)
Immature Granulocytes: 0 %
Lymphocytes Relative: 29 %
Lymphs Abs: 1.2 10*3/uL (ref 0.7–4.0)
MCH: 32.7 pg (ref 26.0–34.0)
MCHC: 31.5 g/dL (ref 30.0–36.0)
MCV: 103.7 fL — ABNORMAL HIGH (ref 80.0–100.0)
Monocytes Absolute: 0.3 10*3/uL (ref 0.1–1.0)
Monocytes Relative: 8 %
Neutro Abs: 2.4 10*3/uL (ref 1.7–7.7)
Neutrophils Relative %: 57 %
Platelets: 195 10*3/uL (ref 150–400)
RBC: 2.97 MIL/uL — ABNORMAL LOW (ref 3.87–5.11)
RDW: 14.6 % (ref 11.5–15.5)
WBC: 4.2 10*3/uL (ref 4.0–10.5)
nRBC: 0 % (ref 0.0–0.2)

## 2022-02-18 MED ORDER — DENOSUMAB 120 MG/1.7ML ~~LOC~~ SOLN
120.0000 mg | Freq: Once | SUBCUTANEOUS | Status: AC
  Administered 2022-02-18: 120 mg via SUBCUTANEOUS
  Filled 2022-02-18: qty 1.7

## 2022-02-18 MED ORDER — FULVESTRANT 250 MG/5ML IM SOSY
500.0000 mg | PREFILLED_SYRINGE | Freq: Once | INTRAMUSCULAR | Status: AC
  Administered 2022-02-18: 500 mg via INTRAMUSCULAR
  Filled 2022-02-18: qty 10

## 2022-02-18 NOTE — Patient Instructions (Signed)
[facility]  Discharge Instructions: ?Thank you for choosing [facility] to provide your oncology and hematology care.  ?If you have a lab appointment with the [facility], please come in thru the Main Entrance and check in at the main information desk. ? ?Wear comfortable clothing and clothing appropriate for easy access to any Portacath or PICC line.  ? ?We strive to give you quality time with your provider. You may need to reschedule your appointment if you arrive late (15 or more minutes).  Arriving late affects you and other patients whose appointments are after yours.  Also, if you miss three or more appointments without notifying the office, you may be dismissed from the clinic at the provider?s discretion.    ?  ?For prescription refill requests, have your pharmacy contact our office and allow 72 hours for refills to be completed.   ? ?Today you received the following Faslodex and Xgeva, return as scheduled. ?  ?To help prevent nausea and vomiting after your treatment, we encourage you to take your nausea medication as directed. ? ?BELOW ARE SYMPTOMS THAT SHOULD BE REPORTED IMMEDIATELY: ?*FEVER GREATER THAN 100.4 F (38 ?C) OR HIGHER ?*CHILLS OR SWEATING ?*NAUSEA AND VOMITING THAT IS NOT CONTROLLED WITH YOUR NAUSEA MEDICATION ?*UNUSUAL SHORTNESS OF BREATH ?*UNUSUAL BRUISING OR BLEEDING ?*URINARY PROBLEMS (pain or burning when urinating, or frequent urination) ?*BOWEL PROBLEMS (unusual diarrhea, constipation, pain near the anus) ?TENDERNESS IN MOUTH AND THROAT WITH OR WITHOUT PRESENCE OF ULCERS (sore throat, sores in mouth, or a toothache) ?UNUSUAL RASH, SWELLING OR PAIN  ?UNUSUAL VAGINAL DISCHARGE OR ITCHING  ? ?Items with * indicate a potential emergency and should be followed up as soon as possible or go to the Emergency Department if any problems should occur. ? ?Please show the CHEMOTHERAPY ALERT CARD or IMMUNOTHERAPY ALERT CARD at check-in to the Emergency Department and  triage nurse. ? ?Should you have questions after your visit or need to cancel or reschedule your appointment, please contact [facility] [phone]  and follow the prompts.  Office hours are 8:00 a.m. to 4:30 p.m. Monday - Friday. Please note that voicemails left after 4:00 p.m. may not be returned until the following business day.  We are closed weekends and major holidays. You have access to a nurse at all times for urgent questions. Please call the main number to the clinic [phone] and follow the prompts. ? ?For any non-urgent questions, you may also contact your provider using MyChart. We now offer e-Visits for anyone 23 and older to request care online for non-urgent symptoms. For details visit [email or web address]. ?  ?Also download the MyChart app! Go to the app store, search "MyChart", open the app, select [facility], and log in with your MyChart username and password. ? ?Due to Covid, a mask is required upon entering the hospital/clinic. If you do not have a mask, one will be given to you upon arrival. For doctor visits, patients may have 1 support person aged 43 or older with them. For treatment visits, patients cannot have anyone with them due to current Covid guidelines and our immunocompromised population.  ?

## 2022-02-18 NOTE — Progress Notes (Signed)
Patient tolerated Faslodex injection with no complaints voiced. Bilateral sites clean and dry with no bruising or swelling noted. Band aids applied. See MAR for details. Patient stable during and after injections. ?Patient taking calcium as directed. Denied tooth, jaw, and leg pain. No recent or upcoming dental visits. Labs reviewed. Patient tolerated injection with no complaints voiced. See MAR for details. Patient stable during and after injection. Site clean and dry with no bruising or swelling noted. Band aid applied. Vss with discharge via [facility] and left in satisfactory condition with no s/s of distress.   ?

## 2022-02-19 LAB — CANCER ANTIGEN 15-3: CA 15-3: 16.7 U/mL (ref 0.0–25.0)

## 2022-02-19 LAB — CANCER ANTIGEN 27.29: CA 27.29: 18.6 U/mL (ref 0.0–38.6)

## 2022-02-25 ENCOUNTER — Ambulatory Visit (HOSPITAL_COMMUNITY): Payer: Medicare Other | Admitting: Hematology

## 2022-03-05 ENCOUNTER — Encounter (HOSPITAL_COMMUNITY): Payer: Self-pay | Admitting: Hematology

## 2022-03-05 ENCOUNTER — Inpatient Hospital Stay (HOSPITAL_COMMUNITY): Payer: Medicare Other | Attending: Hematology | Admitting: Hematology

## 2022-03-05 VITALS — BP 145/78 | HR 85 | Temp 98.4°F | Resp 20

## 2022-03-05 DIAGNOSIS — R1031 Right lower quadrant pain: Secondary | ICD-10-CM | POA: Insufficient documentation

## 2022-03-05 DIAGNOSIS — Z79899 Other long term (current) drug therapy: Secondary | ICD-10-CM | POA: Diagnosis not present

## 2022-03-05 DIAGNOSIS — D649 Anemia, unspecified: Secondary | ICD-10-CM

## 2022-03-05 DIAGNOSIS — C50411 Malignant neoplasm of upper-outer quadrant of right female breast: Secondary | ICD-10-CM

## 2022-03-05 DIAGNOSIS — G893 Neoplasm related pain (acute) (chronic): Secondary | ICD-10-CM | POA: Insufficient documentation

## 2022-03-05 DIAGNOSIS — Z170 Estrogen receptor positive status [ER+]: Secondary | ICD-10-CM | POA: Diagnosis not present

## 2022-03-05 DIAGNOSIS — Z87891 Personal history of nicotine dependence: Secondary | ICD-10-CM | POA: Diagnosis not present

## 2022-03-05 DIAGNOSIS — C50911 Malignant neoplasm of unspecified site of right female breast: Secondary | ICD-10-CM | POA: Diagnosis present

## 2022-03-05 DIAGNOSIS — Z5111 Encounter for antineoplastic chemotherapy: Secondary | ICD-10-CM | POA: Diagnosis present

## 2022-03-05 DIAGNOSIS — D539 Nutritional anemia, unspecified: Secondary | ICD-10-CM | POA: Diagnosis not present

## 2022-03-05 DIAGNOSIS — C7951 Secondary malignant neoplasm of bone: Secondary | ICD-10-CM | POA: Insufficient documentation

## 2022-03-05 NOTE — Progress Notes (Signed)
? ?[facility] ?[address] ?[place], [place] [address] ? ? ?Patient Care Team: ?Sela Hilding as PCP - General (Internal Medicine) ?Arnoldo Lenis, MD as PCP - Cardiology (Cardiology) ?Brien Mates, RN as Oncology Nurse Navigator (Oncology) ? ?SUMMARY OF ONCOLOGIC HISTORY: ?Oncology History  ?Metastatic breast cancer  ?11/23/2017 Initial Diagnosis  ? Metastatic breast cancer [facility]) ?  ?02/07/2021 Cancer Staging  ? Staging form: Breast, AJCC 8th Edition ?- Clinical stage from 02/07/2021: Stage IV (cT2, cN1, pM1, G1, ER+, PR+, HER2: Equivocal) - Signed by Derek Jack, MD on 02/07/2021 ?Histopathologic type: Infiltrating duct carcinoma, NOS ?Stage prefix: Initial diagnosis ?Histologic grading system: 3 grade system ? ?  ?02/07/2021 - 02/07/2021 Chemotherapy  ?  ? ?  ? ?  ? ? ?CHIEF COMPLIANT: Follow-up of metastatic right breast cancer to the bones ? ? ?INTERVAL HISTORY: Stacey [name] is a 76 y.o. 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 Her last visit was on 11/26/2021.  ? ?Today she reports feeling good. She continues to have constant right abdominal pain radiating to her back for which she is taking oxycodone 3-4 times daily. She is not taking iron tablets. She denies hot flashes. She continues to take Calcium. She reports right shoulder pain. Her appetite is good.  ? ?REVIEW OF SYSTEMS:   ?Review of Systems  ?Constitutional:  Negative for appetite change and fatigue.  ?Gastrointestinal:  Positive for abdominal pain.  ?Endocrine: Negative for hot flashes.  ?Musculoskeletal:  Positive for arthralgias (8/10 R leg) and back pain (8/10).  ?All other systems reviewed and are negative. ? ?I have reviewed the past medical history, past surgical history, social history and family history with the patient and they are unchanged from previous note. ? ? ?ALLERGIES:   ?is allergic to brilinta [ticagrelor], budesonide, and  albuterol. ? ? ?MEDICATIONS:  ?Current Outpatient Medications  ?Medication Sig Dispense Refill  ? acetaminophen (TYLENOL) 325 MG tablet Take 2 tablets (650 mg total) by mouth every 6 (six) hours as needed for mild pain, fever or headache. 12 tablet 0  ? amLODipine (NORVASC) 5 MG tablet TAKE 1 TABLET (5 MG TOTAL) BY MOUTH DAILY 90 tablet 2  ? aspirin EC 81 MG tablet Take 1 tablet (81 mg total) by mouth daily with breakfast. Swallow whole. 30 tablet 11  ? atorvastatin (LIPITOR) 80 MG tablet TAKE 1 TABLET EVERY MORNING 90 tablet 3  ? b complex vitamins tablet Take 1 tablet by mouth every morning. 30 tablet 0  ? carboxymethylcellulose (REFRESH PLUS) 0.5 % SOLN Place 1 drop into both eyes in the morning, at noon, in the evening, and at bedtime.    ? clopidogrel (PLAVIX) 75 MG tablet TAKE 1 TABLET EVERY DAY 90 tablet 3  ? DULoxetine (CYMBALTA) 30 MG capsule Take 3 capsules (90 mg total) by mouth daily. (Patient taking differently: Take 30 mg by mouth daily.) 30 capsule 0  ? losartan (COZAAR) 100 MG tablet Take 1 tablet (100 mg total) by mouth daily. (Patient taking differently: Take 50 mg by mouth daily.) 90 tablet 3  ? losartan (COZAAR) 50 MG tablet Take by mouth.    ? metoprolol succinate (TOPROL-XL) 100 MG 24 hr tablet TAKE 1 AND 1/2 TABLETS DAILY (DOSE INCREASE) 135 tablet 2  ? Multiple Vitamin (MULTIVITAMIN ADULT PO) Take 1 tablet by mouth daily.    ? Multiple Vitamins-Minerals (EQ VISION FORMULA 50+ PO) Take 1 tablet by mouth every morning.    ?  nitroGLYCERIN (NITROSTAT) 0.4 MG SL tablet Place 1 tablet (0.4 mg total) under the tongue every 5 (five) minutes x 3 doses as needed for chest pain (if no relief after 3rd dose, proceed to the ED or call 911). 25 tablet 3  ? Oxycodone HCl 10 MG TABS Take by mouth.    ? PAIN MANAGEMENT INTRATHECAL, IT, PUMP by Intrathecal route.    ? polyethylene glycol powder (GLYCOLAX/MIRALAX) 17 GM/SCOOP powder Take 17 g by mouth daily as needed for moderate constipation.    ? pregabalin  (LYRICA) 50 MG capsule Take 50 mg by mouth 3 (three) times daily.    ? ?No current facility-administered medications for this visit.  ? ? ? ?PHYSICAL EXAMINATION: ?Performance status (ECOG): 4 - Bedbound ? ?Vitals:  ? 03/05/22 1512  ?BP: (!) 145/78  ?Pulse: 85  ?Resp: 20  ?Temp: 98.4 ?F (36.9 ?C)  ?SpO2: 94%  ? ?Wt Readings from Last 3 Encounters:  ?10/23/21 205 lb (93 kg)  ?05/01/21 198 lb (89.8 kg)  ?03/31/21 204 lb 9.4 oz (92.8 kg)  ? ?Physical Exam ?Vitals reviewed.  ?Constitutional:   ?   Appearance: Normal appearance.  ?Cardiovascular:  ?   Rate and Rhythm: Normal rate and regular rhythm.  ?   Pulses: Normal pulses.  ?   Heart sounds: Normal heart sounds.  ?Pulmonary:  ?   Effort: Pulmonary effort is normal.  ?   Breath sounds: Normal breath sounds.  ?Abdominal:  ?   Palpations: Abdomen is soft. There is no hepatomegaly or mass.  ?   Tenderness: There is abdominal tenderness.  ?Musculoskeletal:  ?   Right lower leg: No edema.  ?   Left lower leg: No edema.  ?Neurological:  ?   General: No focal deficit present.  ?   Mental Status: She is alert and oriented to person, place, and time.  ?Psychiatric:     ?   Mood and Affect: Mood normal.     ?   Behavior: Behavior normal.  ? ? ?Breast Exam Chaperone: Thana Ates   ? ? ?LABORATORY DATA:  ?I have reviewed the data as listed ? ?  Latest Ref Rng & Units 02/18/2022  ?  1:28 PM 01/21/2022  ?  1:27 PM 12/24/2021  ?  1:41 PM  ?CMP  ?Glucose 70 - 99 mg/dL 138   101   102    ?BUN 8 - 23 mg/dL _0 ?Creatinine 0.44 - 1.00 mg/dL 0.70   0.67   0.66    ?Sodium 135 - 145 mmol/L 139   140   142    ?Potassium 3.5 - 5.1 mmol/L 4.5   4.4   4.7    ?Chloride 98 - 111 mmol/L 102   104   103    ?CO2 22 - 32 mmol/L 33   32   32    ?Calcium 8.9 - 10.3 mg/dL 8.8   9.2   9.0    ?Total Protein 6.5 - 8.1 g/dL 6.0   5.6   6.1    ?Total Bilirubin 0.3 - 1.2 mg/dL 1.0   1.0   1.1    ?Alkaline Phos 38 - 126 U/L 38   39   41    ?AST 15 - 41 U/L _1 ?ALT 0 - 44 U/L _2 ? ?Lab Results  ?Component Value  Date  ? QVH001 16.7 02/18/2022  ? UYW903 20.4 10/01/2021  ? PND583 17.1 02/07/2021  ? ?Lab Results  ?Component Value Date  ? WBC 4.2 02/18/2022  ? HGB 9.7 (L) 02/18/2022  ? HCT 30.8 (L) 02/18/2022  ? MCV 103.7 (H) 02/18/2022  ? PLT 195 02/18/2022  ? NEUTROABS 2.4 02/18/2022  ? ? ?ASSESSMENT:  ?1.  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 ?-Ultrasound-guided biopsy on 01/07/2016 showed grade 1 invasive ductal carcinoma, ER positive, PR positive, HER-2 equivocal by FISH, right axillary lymph node with metastatic adenocarcinoma.  HER-2 equivocal since her 2/CEP17 ratio less than 2 and average copy number of her 2 is greater than or equal to 4 but less than 6 signals per cell. ?-Bone biopsy on 05/30/2016 consistent with metastatic breast cancer. ?-Palliative therapy with letrozole started under the direction of Dr. Deitra Mayo at [facility] from 02/25/2016 through February 2020, progressive symptoms with increased soft tissue extension at L2. ?-She was transitioned to fulvestrant in February 2020. ?-Addition of Ibrance was discussed but she was concerned about cost of the medication. ?-CT abdomen and pelvis on 08/13/2020 shows acute compression fracture deformity of the L1 vertebral body and other chronic findings. ?-Her last Faslodex injection at [facility] was on 11/06/2020. ?-CT CAP on 02/19/2021 with no evidence of for solid organ metastasis or nodal metastasis.  Right breast lesion is stable.  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 ?-Bone scan on 02/19/2021 was negative for bone mets. ?  ?2.  Social/family history: ?-She worked for [employer] prior to retirement in 2010. ?-She is currently living with her son who is  helping with her day-to-day activities.  Daughter lives in [place].  She quit smoking 35 to 40 years ago. ?-She is mostly confined to bed.  However she is able to change clothes, able to wash upper body, halfway dress herself.  She

## 2022-03-05 NOTE — Patient Instructions (Addendum)
[facility] at [facility] ?Discharge Instructions ? ?You were seen and examined today by Dr. Delton Coombes. He reviewed your most recent labs and everything looks stable except your hemoglobin is low. Start taking over the counter Iron 65 mg tablets once daily to help with your hemoglobin levels. Please keep follow up appointments for scan, bone scan return visit and labs as scheduled in 3 months. ? ? ?Thank you for choosing [facility] at [facility] to provide your oncology and hematology care.  To afford each patient quality time with our provider, please arrive at least 15 minutes before your scheduled appointment time.  ? ?If you have a lab appointment with the [facility] please come in thru the Main Entrance and check in at the main information desk. ? ?You need to re-schedule your appointment should you arrive 10 or more minutes late.  We strive to give you quality time with our providers, and arriving late affects you and other patients whose appointments are after yours.  Also, if you no show three or more times for appointments you may be dismissed from the clinic at the providers discretion.     ?Again, thank you for choosing [facility].  Our hope is that these requests will decrease the amount of time that you wait before being seen by our physicians.       ?_____________________________________________________________ ? ?Should you have questions after your visit to [facility], please contact our office at [phone] and follow the prompts.  Our office hours are 8:00 a.m. and 4:30 p.m. Monday - Friday.  Please note that voicemails left after 4:00 p.m. may not be returned until the following business day.  We are closed weekends and major holidays.  You do have access to a nurse 24-7, just call the main number to the clinic [phone] and do not press any options, hold on the line and a nurse will answer the phone.   ? ?For  prescription refill requests, have your pharmacy contact our office and allow 72 hours.   ? ?Due to Covid, you will need to wear a mask upon entering the hospital. If you do not have a mask, a mask will be given to you at the Main Entrance upon arrival. For doctor visits, patients may have 1 support person age 54 or older with them. For treatment visits, patients can not have anyone with them due to social distancing guidelines and our immunocompromised population.  ? ?  ?

## 2022-03-12 ENCOUNTER — Emergency Department (HOSPITAL_COMMUNITY)
Admission: EM | Admit: 2022-03-12 | Discharge: 2022-03-13 | Disposition: A | Discharge status: Home / Self Care | Payer: Medicare Other | Attending: Emergency Medicine | Admitting: Emergency Medicine

## 2022-03-12 ENCOUNTER — Other Ambulatory Visit: Payer: Self-pay

## 2022-03-12 ENCOUNTER — Emergency Department (HOSPITAL_COMMUNITY): Payer: Medicare Other

## 2022-03-12 ENCOUNTER — Encounter (HOSPITAL_COMMUNITY): Payer: Self-pay | Admitting: Emergency Medicine

## 2022-03-12 DIAGNOSIS — I10 Essential (primary) hypertension: Secondary | ICD-10-CM | POA: Insufficient documentation

## 2022-03-12 DIAGNOSIS — D539 Nutritional anemia, unspecified: Secondary | ICD-10-CM | POA: Insufficient documentation

## 2022-03-12 DIAGNOSIS — Z79899 Other long term (current) drug therapy: Secondary | ICD-10-CM | POA: Insufficient documentation

## 2022-03-12 DIAGNOSIS — M545 Low back pain, unspecified: Secondary | ICD-10-CM | POA: Insufficient documentation

## 2022-03-12 DIAGNOSIS — C50911 Malignant neoplasm of unspecified site of right female breast: Secondary | ICD-10-CM | POA: Insufficient documentation

## 2022-03-12 DIAGNOSIS — E875 Hyperkalemia: Secondary | ICD-10-CM | POA: Diagnosis not present

## 2022-03-12 DIAGNOSIS — Z7982 Long term (current) use of aspirin: Secondary | ICD-10-CM | POA: Insufficient documentation

## 2022-03-12 DIAGNOSIS — Z7901 Long term (current) use of anticoagulants: Secondary | ICD-10-CM | POA: Insufficient documentation

## 2022-03-12 DIAGNOSIS — I4891 Unspecified atrial fibrillation: Secondary | ICD-10-CM | POA: Insufficient documentation

## 2022-03-12 MED ORDER — ONDANSETRON HCL 4 MG/2ML IJ SOLN
4.0000 mg | Freq: Once | INTRAMUSCULAR | Status: AC
  Administered 2022-03-13: 4 mg via INTRAVENOUS
  Filled 2022-03-12: qty 2

## 2022-03-12 MED ORDER — HYDROMORPHONE HCL 1 MG/ML IJ SOLN
1.0000 mg | Freq: Once | INTRAMUSCULAR | Status: AC
  Administered 2022-03-13: 1 mg via INTRAVENOUS
  Filled 2022-03-12: qty 1

## 2022-03-12 NOTE — ED Triage Notes (Signed)
Pt c/o right lower back pain, pelvis pain and bilateral hips. Pt has pain pump for metastatic cancer.  ?

## 2022-03-12 NOTE — ED Provider Notes (Signed)
?[facility] ?Provider Note ? ? ?CSN: [id number] ?Arrival date & time: 03/12/22  2128 ? ?  ? ?History ? ?Chief Complaint  ?Patient presents with  ? Back Pain  ? ? ?Stacey [name] is a 76 y.o. female. ? ?The history is provided by the patient.  ?Back Pain ?She has history of hypertension, hyperlipidemia, paroxysmal atrial fibrillation but not on anticoagulants, metastatic breast cancer, spinal stenosis currently going through pain management with intrathecal pump and oral oxycodone.  She states that her pain was at its usual state until this evening when she was transferred from wheelchair to bed and pain in bilateral groin got much worse, right worse than left.  Pain in the right groin had been constant, but pain in left groin is new.  She is also having pain going down her lef leg.  Her left leg has been numb for quite some time and she has not noticed any change in the numbness.  She denies any bowel or bladder dysfunction.  She denies any actual trauma. ?  ?Home Medications ?Prior to Admission medications   ?Medication Sig Start Date End Date Taking? Authorizing Provider  ?acetaminophen (TYLENOL) 325 MG tablet Take 2 tablets (650 mg total) by mouth every 6 (six) hours as needed for mild pain, fever or headache. 04/05/21   Roxan Hockey, MD  ?amLODipine (NORVASC) 5 MG tablet TAKE 1 TABLET (5 MG TOTAL) BY MOUTH DAILY 01/27/22   Arnoldo Lenis, MD  ?aspirin EC 81 MG tablet Take 1 tablet (81 mg total) by mouth daily with breakfast. Swallow whole. 04/07/21   Roxan Hockey, MD  ?atorvastatin (LIPITOR) 80 MG tablet TAKE 1 TABLET EVERY MORNING 07/03/21   Arnoldo Lenis, MD  ?b complex vitamins tablet Take 1 tablet by mouth every morning. 07/05/20   Florencia Reasons, MD  ?carboxymethylcellulose (REFRESH PLUS) 0.5 % SOLN Place 1 drop into both eyes in the morning, at noon, in the evening, and at bedtime.    [provider]  ?clopidogrel (PLAVIX) 75 MG tablet TAKE 1 TABLET EVERY DAY 07/03/21    Arnoldo Lenis, MD  ?DULoxetine (CYMBALTA) 30 MG capsule Take 3 capsules (90 mg total) by mouth daily. ?Patient taking differently: Take 30 mg by mouth daily. 07/06/20   Florencia Reasons, MD  ?losartan (COZAAR) 100 MG tablet Take 1 tablet (100 mg total) by mouth daily. ?Patient taking differently: Take 50 mg by mouth daily. 07/11/21   Arnoldo Lenis, MD  ?losartan (COZAAR) 50 MG tablet Take by mouth. 10/17/21   [provider]  ?metoprolol succinate (TOPROL-XL) 100 MG 24 hr tablet TAKE 1 AND 1/2 TABLETS DAILY (DOSE INCREASE) 06/19/21   Arnoldo Lenis, MD  ?Multiple Vitamin (MULTIVITAMIN ADULT PO) Take 1 tablet by mouth daily.    [provider]  ?Multiple Vitamins-Minerals (EQ VISION FORMULA 50+ PO) Take 1 tablet by mouth every morning.    [provider]  ?nitroGLYCERIN (NITROSTAT) 0.4 MG SL tablet Place 1 tablet (0.4 mg total) under the tongue every 5 (five) minutes x 3 doses as needed for chest pain (if no relief after 3rd dose, proceed to the ED or call 911). 07/11/21   Arnoldo Lenis, MD  ?Oxycodone HCl 10 MG TABS Take by mouth. 11/21/21 03/21/22  [provider]  ?PAIN MANAGEMENT INTRATHECAL, IT, PUMP by Intrathecal route. 02/21/21   [provider]  ?polyethylene glycol powder (GLYCOLAX/MIRALAX) 17 GM/SCOOP powder Take 17 g by mouth daily as needed for moderate constipation.  [provider]  ?pregabalin (LYRICA) 50 MG capsule Take 50 mg by mouth 3 (three) times daily. 01/26/21   [provider]  ?   ? ?Allergies    ?Brilinta [ticagrelor], Budesonide, and Albuterol   ? ?Review of Systems   ?Review of Systems  ?Musculoskeletal:  Positive for back pain.  ?All other systems reviewed and are negative. ? ?Physical Exam ?Updated Vital Signs ?BP 117/62   Pulse 72   Temp 97.6 ?F (36.4 ?C) (Oral)   Resp 20   Ht 5' (1.524 m)   Wt 93 kg   SpO2 93%   BMI 40.04 kg/m?  ?Physical Exam ?Vitals and nursing note reviewed.  ?76 year old female, resting  comfortably and in no acute distress. Vital signs are significant for borderline elevated blood pressure. Oxygen saturation is 97%, which is normal. ?Head is normocephalic and atraumatic. PERRLA, EOMI. Oropharynx is clear. ?Neck is nontender and supple without adenopathy or JVD. ?Back is moderately tender throughout the lumbar area.  There is no CVA tenderness. ?Lungs are clear without rales, wheezes, or rhonchi. ?Chest is nontender. ?Heart has regular rate and rhythm without murmur. ?Abdomen is soft, flat, nontender.  There is tenderness in both inguinal areas but not over the pelvic brim. ?Extremities have no cyanosis or edema, full range of motion is present.  She does complain of pain with range of motion of both hips ?Skin is warm and dry without rash. ?Neurologic: Mental status is normal, cranial nerves are intact, she has decreased sensation in the left leg, which is apparently chronic.  Motor strength in lower extremities is symmetric. ? ?ED Results / Procedures / Treatments   ?Labs ?(all labs ordered are listed, but only abnormal results are displayed) ?Labs Reviewed  ?BASIC METABOLIC PANEL - Abnormal; Notable for the following components:  ?    Result Value  ? Potassium 5.9 (*)   ? Calcium 8.6 (*)   ? All other components within normal limits  ?CBC WITH DIFFERENTIAL/PLATELET - Abnormal; Notable for the following components:  ? RBC 2.96 (*)   ? Hemoglobin 9.6 (*)   ? HCT 30.6 (*)   ? MCV 103.4 (*)   ? All other components within normal limits  ?CBC WITH DIFFERENTIAL/PLATELET  ? ?Radiology ?CT ABDOMEN PELVIS WO CONTRAST ? ?Result Date: 03/13/2022 ?CLINICAL DATA:  History of breast carcinoma and right-sided abdominal pain EXAM: CT ABDOMEN AND PELVIS WITHOUT CONTRAST TECHNIQUE: Multidetector CT imaging of the abdomen and pelvis was performed following the standard protocol without IV contrast. RADIATION DOSE REDUCTION: This exam was performed according to the departmental dose-optimization program which  includes automated exposure control, adjustment of the mA and/or kV according to patient size and/or use of iterative reconstruction technique. COMPARISON:  12/13/2021 FINDINGS: Lower chest: No focal infiltrate or sizable effusion is seen. Hepatobiliary: No focal liver abnormality is seen. Status post cholecystectomy. No biliary dilatation. Pancreas: Unremarkable. No pancreatic ductal dilatation or surrounding inflammatory changes. Spleen: Normal in size without focal abnormality. Adrenals/Urinary Tract: Adrenal glands are within normal limits. Kidneys show no renal calculi or obstructive changes. Mixed attenuation lesion is noted midportion of the left kidney stable in appearance from the prior exam. This measures approximately 2.3 cm. This is again suspicious for renal cell carcinoma. No obstructive changes are seen. The bladder is decompressed. Stomach/Bowel: Scattered diverticular change of the colon is seen without evidence of diverticulitis. Scattered fecal material is noted throughout the colon. The appendix is not well visualized and appears to have been surgically  removed. Small bowel and stomach are within normal limits. Vascular/Lymphatic: Atherosclerotic calcifications of the aorta are noted. 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 No significant lymphadenopathy is noted. Reproductive: Status post hysterectomy. No adnexal masses. Other: No abdominal wall hernia or abnormality. No abdominopelvic ascites. Musculoskeletal: Degenerative changes of lumbar spine are noted. Sclerotic foci are again seen involving the L2 and L4 vertebral bodies. Chronic compression fracture of L1 is noted stable from the prior study. No new compression fracture is seen. Pain pump is noted in the right anterior abdominal wall. IMPRESSION: Stable mixed attenuation lesion in the left kidney incompletely evaluated on this exam. This is again suspicious for renal cell carcinoma. Nonemergent MRI  of the kidneys would be helpful for further evaluation. Outpatient imaging would allow for optimum diagnostic quality. Diverticulosis without diverticulitis. 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

## 2022-03-13 ENCOUNTER — Emergency Department (HOSPITAL_COMMUNITY): Payer: Medicare Other

## 2022-03-13 ENCOUNTER — Other Ambulatory Visit: Payer: Self-pay | Admitting: Radiation Therapy

## 2022-03-13 DIAGNOSIS — M545 Low back pain, unspecified: Secondary | ICD-10-CM | POA: Diagnosis not present

## 2022-03-13 LAB — CBC WITH DIFFERENTIAL/PLATELET
Abs Immature Granulocytes: 0.02 10*3/uL (ref 0.00–0.07)
Basophils Absolute: 0 10*3/uL (ref 0.0–0.1)
Basophils Relative: 1 %
Eosinophils Absolute: 0.2 10*3/uL (ref 0.0–0.5)
Eosinophils Relative: 4 %
HCT: 30.6 % — ABNORMAL LOW (ref 36.0–46.0)
Hemoglobin: 9.6 g/dL — ABNORMAL LOW (ref 12.0–15.0)
Immature Granulocytes: 0 %
Lymphocytes Relative: 28 %
Lymphs Abs: 1.6 10*3/uL (ref 0.7–4.0)
MCH: 32.4 pg (ref 26.0–34.0)
MCHC: 31.4 g/dL (ref 30.0–36.0)
MCV: 103.4 fL — ABNORMAL HIGH (ref 80.0–100.0)
Monocytes Absolute: 0.5 10*3/uL (ref 0.1–1.0)
Monocytes Relative: 8 %
Neutro Abs: 3.4 10*3/uL (ref 1.7–7.7)
Neutrophils Relative %: 59 %
Platelets: 222 10*3/uL (ref 150–400)
RBC: 2.96 MIL/uL — ABNORMAL LOW (ref 3.87–5.11)
RDW: 14.4 % (ref 11.5–15.5)
WBC: 5.8 10*3/uL (ref 4.0–10.5)
nRBC: 0 % (ref 0.0–0.2)

## 2022-03-13 LAB — BASIC METABOLIC PANEL
Anion gap: 12 (ref 5–15)
BUN: 22 mg/dL (ref 8–23)
CO2: 23 mmol/L (ref 22–32)
Calcium: 8.6 mg/dL — ABNORMAL LOW (ref 8.9–10.3)
Chloride: 105 mmol/L (ref 98–111)
Creatinine, Ser: 0.85 mg/dL (ref 0.44–1.00)
GFR, Estimated: >60 mL/min (ref >60)
Glucose, Bld: 95 mg/dL (ref 70–99)
Potassium: 5.9 mmol/L — ABNORMAL HIGH (ref 3.5–5.1)
Sodium: 140 mmol/L (ref 135–145)

## 2022-03-13 LAB — POTASSIUM: Potassium: 3.6 mmol/L (ref 3.5–5.1)

## 2022-03-13 MED ORDER — GADOBUTROL 1 MMOL/ML IV SOLN
10.0000 mL | Freq: Once | INTRAVENOUS | Status: AC | PRN
  Administered 2022-03-13: 10 mL via INTRAVENOUS

## 2022-03-13 MED ORDER — SODIUM ZIRCONIUM CYCLOSILICATE 5 G PO PACK
10.0000 g | PACK | Freq: Once | ORAL | Status: AC
  Administered 2022-03-13: 10 g via ORAL
  Filled 2022-03-13: qty 2

## 2022-03-13 NOTE — ED Notes (Signed)
[employer] rep, Julien Nordmann, called and reports she doesn't have to be at the hospital at time of MRI, but to text her when the MRI is done at [phone], and let them know the MRI is complete and they will send a rep to [place] to interrogate pt's pain pump.  ?

## 2022-03-13 NOTE — Discharge Instructions (Addendum)
Your MRI shows a mass on your lumbar spine that is pushing on your spinal cord.  You will need to follow-up with Dr. Delton Coombes and possibly radiation oncology depending on their opinion on how to treat this. ? ?If you develop worsening, recurrent, or continued back pain, numbness or weakness in the legs, incontinence of your bowels or bladders, numbness of your buttocks, fever, abdominal pain, or any other new/concerning symptoms then return to the ER for evaluation.  ?

## 2022-03-13 NOTE — ED Provider Notes (Signed)
9:00 AM I discussed case with MRI tech.  Based on her pain pump, she cannot get this long of MRI and we will have to coordinate with the rep to reset it.  When further talking to patient, her primary area of concern is her lumbar back with her leg pain and groin pain.  She has no thoracic pain.  She has had some neck discomfort but no neuro symptoms.  After discussion, we decided to hold off on the C and T-spine and just do L-spine with and without is as most pertinent today and she can get further MRIs as an outpatient. ? ?12:20 PM I was able to speak to Dr. Delton Coombes.  He has reviewed the MRI.  Thinks is probably stable but given her groin pain which could be L1 neuropathy, he recommends at least talking to radiation oncology. ? ?1:09 PM I have discussed with both Dr. Mickeal Skinner and Dr. Reatha Armour.  Dr. Mickeal Skinner has reviewed the MRI images and there is probably nothing acute to do but he wants neurosurgery to see it.  Discussed with Dr. Reatha Armour who is reviewed and it seems like this tumor has been there for several months going back into 2022.  Patient reports no new neuro symptoms, only the groin pain which is probably coming from the L1 compression.  However there is nothing emergent to do given no new weakness or numbness and they will talk about it at tumor board.  Patient understands this and her pain is well controlled and she appears stable for discharge home. ?  ?Sherwood Gambler, MD ?03/13/22 1309 ? ?

## 2022-03-13 NOTE — ED Notes (Signed)
Unable to update vital signs.  Patient having MRI  ?

## 2022-03-13 NOTE — Progress Notes (Signed)
Patient was sleeping on RA but sats dropped down to 85 and 86%.  Placed patient on 2L [place] and sat is now 99 to 100%.  Patient did state that she had a sleep study years ago and was given home O2.  She kept the O2 for about a year and then found out she didn't need it and sent it back. ?

## 2022-03-13 NOTE — ED Notes (Signed)
[employer] rep texted, per their request, to notify that pt's MRI is complete.  ?

## 2022-03-17 ENCOUNTER — Inpatient Hospital Stay: Payer: Medicare Other | Attending: Internal Medicine

## 2022-03-18 ENCOUNTER — Inpatient Hospital Stay (HOSPITAL_COMMUNITY): Payer: Medicare Other

## 2022-03-18 ENCOUNTER — Encounter (HOSPITAL_COMMUNITY): Payer: Self-pay

## 2022-03-18 VITALS — BP 153/58 | HR 60 | Temp 98.1°F | Resp 18

## 2022-03-18 DIAGNOSIS — Z5111 Encounter for antineoplastic chemotherapy: Secondary | ICD-10-CM | POA: Diagnosis not present

## 2022-03-18 DIAGNOSIS — Z170 Estrogen receptor positive status [ER+]: Secondary | ICD-10-CM

## 2022-03-18 DIAGNOSIS — C50911 Malignant neoplasm of unspecified site of right female breast: Secondary | ICD-10-CM

## 2022-03-18 LAB — COMPREHENSIVE METABOLIC PANEL
ALT: 13 U/L (ref 0–44)
AST: 19 U/L (ref 15–41)
Albumin: 3.2 g/dL — ABNORMAL LOW (ref 3.5–5.0)
Alkaline Phosphatase: 43 U/L (ref 38–126)
Anion gap: 6 (ref 5–15)
BUN: 19 mg/dL (ref 8–23)
CO2: 32 mmol/L (ref 22–32)
Calcium: 9.1 mg/dL (ref 8.9–10.3)
Chloride: 101 mmol/L (ref 98–111)
Creatinine, Ser: 0.67 mg/dL (ref 0.44–1.00)
GFR, Estimated: >60 mL/min (ref >60)
Glucose, Bld: 106 mg/dL — ABNORMAL HIGH (ref 70–99)
Potassium: 5.1 mmol/L (ref 3.5–5.1)
Sodium: 139 mmol/L (ref 135–145)
Total Bilirubin: 1.1 mg/dL (ref 0.3–1.2)
Total Protein: 6 g/dL — ABNORMAL LOW (ref 6.5–8.1)

## 2022-03-18 MED ORDER — DENOSUMAB 120 MG/1.7ML ~~LOC~~ SOLN
120.0000 mg | Freq: Once | SUBCUTANEOUS | Status: AC
  Administered 2022-03-18: 120 mg via SUBCUTANEOUS
  Filled 2022-03-18: qty 1.7

## 2022-03-18 MED ORDER — FULVESTRANT 250 MG/5ML IM SOSY
500.0000 mg | PREFILLED_SYRINGE | Freq: Once | INTRAMUSCULAR | Status: AC
  Administered 2022-03-18: 500 mg via INTRAMUSCULAR
  Filled 2022-03-18: qty 10

## 2022-03-18 NOTE — Patient Instructions (Signed)
[facility]  Discharge Instructions: ?Thank you for choosing [facility] to provide your oncology and hematology care.  ?If you have a lab appointment with the [facility], please come in thru the Main Entrance and check in at the main information desk. ? ?Wear comfortable clothing and clothing appropriate for easy access to any Portacath or PICC line.  ? ?We strive to give you quality time with your provider. You may need to reschedule your appointment if you arrive late (15 or more minutes).  Arriving late affects you and other patients whose appointments are after yours.  Also, if you miss three or more appointments without notifying the office, you may be dismissed from the clinic at the provider?s discretion.    ?  ?For prescription refill requests, have your pharmacy contact our office and allow 72 hours for refills to be completed.   ? ?Today you received the following chemotherapy and/or immunotherapy agents xgeva and faslodex.  ?  ?To help prevent nausea and vomiting after your treatment, we encourage you to take your nausea medication as directed. ? ?BELOW ARE SYMPTOMS THAT SHOULD BE REPORTED IMMEDIATELY: ?*FEVER GREATER THAN 100.4 F (38 ?C) OR HIGHER ?*CHILLS OR SWEATING ?*NAUSEA AND VOMITING THAT IS NOT CONTROLLED WITH YOUR NAUSEA MEDICATION ?*UNUSUAL SHORTNESS OF BREATH ?*UNUSUAL BRUISING OR BLEEDING ?*URINARY PROBLEMS (pain or burning when urinating, or frequent urination) ?*BOWEL PROBLEMS (unusual diarrhea, constipation, pain near the anus) ?TENDERNESS IN MOUTH AND THROAT WITH OR WITHOUT PRESENCE OF ULCERS (sore throat, sores in mouth, or a toothache) ?UNUSUAL RASH, SWELLING OR PAIN  ?UNUSUAL VAGINAL DISCHARGE OR ITCHING  ? ?Items with * indicate a potential emergency and should be followed up as soon as possible or go to the Emergency Department if any problems should occur. ? ?Please show the CHEMOTHERAPY ALERT CARD or IMMUNOTHERAPY ALERT CARD at check-in to the  Emergency Department and triage nurse. ? ?Should you have questions after your visit or need to cancel or reschedule your appointment, please contact [facility] [phone]  and follow the prompts.  Office hours are 8:00 a.m. to 4:30 p.m. Monday - Friday. Please note that voicemails left after 4:00 p.m. may not be returned until the following business day.  We are closed weekends and major holidays. You have access to a nurse at all times for urgent questions. Please call the main number to the clinic [phone] and follow the prompts. ? ?For any non-urgent questions, you may also contact your provider using MyChart. We now offer e-Visits for anyone 54 and older to request care online for non-urgent symptoms. For details visit [email or web address]. ?  ?Also download the MyChart app! Go to the app store, search "MyChart", open the app, select [facility], and log in with your MyChart username and password. ? ?Due to Covid, a mask is required upon entering the hospital/clinic. If you do not have a mask, one will be given to you upon arrival. For doctor visits, patients may have 1 support person aged 21 or older with them. For treatment visits, patients cannot have anyone with them due to current Covid guidelines and our immunocompromised population.  ?

## 2022-03-18 NOTE — Progress Notes (Signed)
Calcium 9.1 today. Patient takes a multivitamin daily. Patient denies any upcoming major dental work and no jaw pain.  ?

## 2022-03-18 NOTE — Progress Notes (Signed)
Patient tolerated Faslodex injection with no complaints voiced.  Bilateral sites clean and dry with no bruising or swelling noted.  Band aids applied. See MAR for details.  Patient stable during and after injections.     ? ?Patient taking calcium as directed.  Denied tooth, jaw, and leg pain.  No recent or upcoming dental visits.  Labs reviewed.  Patient tolerated injection with no complaints voiced.  See MAR for details.  Patient stable during and after injection.  Site clean and dry with no bruising or swelling noted.  Band aid applied.  Vss with discharge and left in satisfactory condition with no s/s of distress noted.   ?

## 2022-03-23 ENCOUNTER — Encounter (HOSPITAL_COMMUNITY): Payer: Self-pay

## 2022-03-23 ENCOUNTER — Inpatient Hospital Stay (HOSPITAL_COMMUNITY)
Admission: EM | Admit: 2022-03-23 | Discharge: 2022-03-25 | DRG: 202 | Disposition: A | Discharge status: Home Health | Payer: Medicare Other | Attending: Internal Medicine | Admitting: Internal Medicine

## 2022-03-23 ENCOUNTER — Other Ambulatory Visit: Payer: Self-pay

## 2022-03-23 ENCOUNTER — Emergency Department (HOSPITAL_COMMUNITY): Payer: Medicare Other

## 2022-03-23 DIAGNOSIS — Z888 Allergy status to other drugs, medicaments and biological substances status: Secondary | ICD-10-CM

## 2022-03-23 DIAGNOSIS — Z87891 Personal history of nicotine dependence: Secondary | ICD-10-CM

## 2022-03-23 DIAGNOSIS — Z79899 Other long term (current) drug therapy: Secondary | ICD-10-CM

## 2022-03-23 DIAGNOSIS — I252 Old myocardial infarction: Secondary | ICD-10-CM

## 2022-03-23 DIAGNOSIS — J441 Chronic obstructive pulmonary disease with (acute) exacerbation: Secondary | ICD-10-CM | POA: Diagnosis present

## 2022-03-23 DIAGNOSIS — R7989 Other specified abnormal findings of blood chemistry: Secondary | ICD-10-CM | POA: Diagnosis not present

## 2022-03-23 DIAGNOSIS — E66813 Obesity, class 3: Secondary | ICD-10-CM | POA: Diagnosis present

## 2022-03-23 DIAGNOSIS — J9801 Acute bronchospasm: Secondary | ICD-10-CM | POA: Diagnosis not present

## 2022-03-23 DIAGNOSIS — D539 Nutritional anemia, unspecified: Secondary | ICD-10-CM

## 2022-03-23 DIAGNOSIS — G8929 Other chronic pain: Secondary | ICD-10-CM | POA: Diagnosis present

## 2022-03-23 DIAGNOSIS — Z7902 Long term (current) use of antithrombotics/antiplatelets: Secondary | ICD-10-CM

## 2022-03-23 DIAGNOSIS — Z7982 Long term (current) use of aspirin: Secondary | ICD-10-CM

## 2022-03-23 DIAGNOSIS — Z955 Presence of coronary angioplasty implant and graft: Secondary | ICD-10-CM

## 2022-03-23 DIAGNOSIS — Z20822 Contact with and (suspected) exposure to covid-19: Secondary | ICD-10-CM | POA: Diagnosis present

## 2022-03-23 DIAGNOSIS — E782 Mixed hyperlipidemia: Secondary | ICD-10-CM

## 2022-03-23 DIAGNOSIS — E785 Hyperlipidemia, unspecified: Secondary | ICD-10-CM

## 2022-03-23 DIAGNOSIS — Z7401 Bed confinement status: Secondary | ICD-10-CM

## 2022-03-23 DIAGNOSIS — Z170 Estrogen receptor positive status [ER+]: Secondary | ICD-10-CM

## 2022-03-23 DIAGNOSIS — Z8249 Family history of ischemic heart disease and other diseases of the circulatory system: Secondary | ICD-10-CM

## 2022-03-23 DIAGNOSIS — I10 Essential (primary) hypertension: Secondary | ICD-10-CM

## 2022-03-23 DIAGNOSIS — Z825 Family history of asthma and other chronic lower respiratory diseases: Secondary | ICD-10-CM

## 2022-03-23 DIAGNOSIS — I2510 Atherosclerotic heart disease of native coronary artery without angina pectoris: Secondary | ICD-10-CM

## 2022-03-23 DIAGNOSIS — I480 Paroxysmal atrial fibrillation: Secondary | ICD-10-CM | POA: Diagnosis present

## 2022-03-23 DIAGNOSIS — Z6841 Body Mass Index (BMI) 40.0 and over, adult: Secondary | ICD-10-CM

## 2022-03-23 DIAGNOSIS — C50911 Malignant neoplasm of unspecified site of right female breast: Secondary | ICD-10-CM | POA: Diagnosis present

## 2022-03-23 LAB — RESP PANEL BY RT-PCR (FLU A&B, COVID) ARPGX2
Influenza A by PCR: NEGATIVE
Influenza B by PCR: NEGATIVE
SARS Coronavirus 2 by RT PCR: NEGATIVE

## 2022-03-23 LAB — CBC WITH DIFFERENTIAL/PLATELET
Abs Immature Granulocytes: 0.01 10*3/uL (ref 0.00–0.07)
Basophils Absolute: 0 10*3/uL (ref 0.0–0.1)
Basophils Relative: 1 %
Eosinophils Absolute: 0.3 10*3/uL (ref 0.0–0.5)
Eosinophils Relative: 6 %
HCT: 33 % — ABNORMAL LOW (ref 36.0–46.0)
Hemoglobin: 10.8 g/dL — ABNORMAL LOW (ref 12.0–15.0)
Immature Granulocytes: 0 %
Lymphocytes Relative: 15 %
Lymphs Abs: 0.7 10*3/uL (ref 0.7–4.0)
MCH: 32.9 pg (ref 26.0–34.0)
MCHC: 32.7 g/dL (ref 30.0–36.0)
MCV: 100.6 fL — ABNORMAL HIGH (ref 80.0–100.0)
Monocytes Absolute: 0.5 10*3/uL (ref 0.1–1.0)
Monocytes Relative: 10 %
Neutro Abs: 3.3 10*3/uL (ref 1.7–7.7)
Neutrophils Relative %: 68 %
Platelets: 202 10*3/uL (ref 150–400)
RBC: 3.28 MIL/uL — ABNORMAL LOW (ref 3.87–5.11)
RDW: 14.3 % (ref 11.5–15.5)
WBC: 4.9 10*3/uL (ref 4.0–10.5)
nRBC: 0 % (ref 0.0–0.2)

## 2022-03-23 LAB — BASIC METABOLIC PANEL
Anion gap: 4 — ABNORMAL LOW (ref 5–15)
BUN: 13 mg/dL (ref 8–23)
CO2: 32 mmol/L (ref 22–32)
Calcium: 9.4 mg/dL (ref 8.9–10.3)
Chloride: 105 mmol/L (ref 98–111)
Creatinine, Ser: 0.55 mg/dL (ref 0.44–1.00)
GFR, Estimated: >60 mL/min (ref >60)
Glucose, Bld: 111 mg/dL — ABNORMAL HIGH (ref 70–99)
Potassium: 4.7 mmol/L (ref 3.5–5.1)
Sodium: 141 mmol/L (ref 135–145)

## 2022-03-23 LAB — TROPONIN I (HIGH SENSITIVITY)
Troponin I (High Sensitivity): 5 ng/L (ref <18)
Troponin I (High Sensitivity): 5 ng/L (ref <18)

## 2022-03-23 LAB — BRAIN NATRIURETIC PEPTIDE: B Natriuretic Peptide: 116 pg/mL — ABNORMAL HIGH (ref 0.0–100.0)

## 2022-03-23 MED ORDER — LOSARTAN POTASSIUM 50 MG PO TABS
50.0000 mg | ORAL_TABLET | Freq: Every day | ORAL | Status: DC
  Administered 2022-03-24 – 2022-03-25 (×2): 50 mg via ORAL
  Filled 2022-03-23 (×2): qty 1

## 2022-03-23 MED ORDER — ADULT MULTIVITAMIN W/MINERALS CH
1.0000 | ORAL_TABLET | Freq: Every day | ORAL | Status: DC
  Administered 2022-03-24 – 2022-03-25 (×2): 1 via ORAL
  Filled 2022-03-23 (×2): qty 1

## 2022-03-23 MED ORDER — ASPIRIN EC 81 MG PO TBEC
81.0000 mg | DELAYED_RELEASE_TABLET | Freq: Every day | ORAL | Status: DC
  Administered 2022-03-24 – 2022-03-25 (×2): 81 mg via ORAL
  Filled 2022-03-23 (×2): qty 1

## 2022-03-23 MED ORDER — CLOPIDOGREL BISULFATE 75 MG PO TABS
75.0000 mg | ORAL_TABLET | Freq: Every day | ORAL | Status: DC
  Administered 2022-03-24 – 2022-03-25 (×2): 75 mg via ORAL
  Filled 2022-03-23 (×2): qty 1

## 2022-03-23 MED ORDER — METHYLPREDNISOLONE SODIUM SUCC 40 MG IJ SOLR
40.0000 mg | Freq: Two times a day (BID) | INTRAMUSCULAR | Status: DC
  Administered 2022-03-24 – 2022-03-25 (×3): 40 mg via INTRAVENOUS
  Filled 2022-03-23 (×3): qty 1

## 2022-03-23 MED ORDER — IPRATROPIUM BROMIDE 0.02 % IN SOLN
0.5000 mg | Freq: Once | RESPIRATORY_TRACT | Status: AC
Start: 2022-03-23 — End: 2022-03-23
  Administered 2022-03-23: 0.5 mg via RESPIRATORY_TRACT
  Filled 2022-03-23: qty 2.5

## 2022-03-23 MED ORDER — METOPROLOL SUCCINATE ER 50 MG PO TB24
150.0000 mg | ORAL_TABLET | Freq: Every day | ORAL | Status: DC
  Administered 2022-03-24 – 2022-03-25 (×2): 150 mg via ORAL
  Filled 2022-03-23 (×3): qty 3

## 2022-03-23 MED ORDER — AZITHROMYCIN 250 MG PO TABS
250.0000 mg | ORAL_TABLET | Freq: Every day | ORAL | Status: DC
  Administered 2022-03-25: 250 mg via ORAL
  Filled 2022-03-23: qty 1

## 2022-03-23 MED ORDER — PHENOL 1.4 % MT LIQD
1.0000 | OROMUCOSAL | Status: DC | PRN
  Administered 2022-03-23: 1 via OROMUCOSAL
  Filled 2022-03-23: qty 177

## 2022-03-23 MED ORDER — DM-GUAIFENESIN ER 30-600 MG PO TB12
1.0000 | ORAL_TABLET | Freq: Two times a day (BID) | ORAL | Status: DC
  Administered 2022-03-23 – 2022-03-24 (×2): 1 via ORAL
  Filled 2022-03-23 (×2): qty 1

## 2022-03-23 MED ORDER — PANTOPRAZOLE SODIUM 40 MG PO TBEC
40.0000 mg | DELAYED_RELEASE_TABLET | Freq: Every day | ORAL | Status: DC
  Administered 2022-03-24 – 2022-03-25 (×2): 40 mg via ORAL
  Filled 2022-03-23 (×2): qty 1

## 2022-03-23 MED ORDER — AMLODIPINE BESYLATE 5 MG PO TABS
5.0000 mg | ORAL_TABLET | Freq: Every day | ORAL | Status: DC
  Administered 2022-03-24 – 2022-03-25 (×2): 5 mg via ORAL
  Filled 2022-03-23 (×2): qty 1

## 2022-03-23 MED ORDER — GUAIFENESIN-DM 100-10 MG/5ML PO SYRP: 5.0000 mL | ORAL_SOLUTION | ORAL | Status: DC | PRN

## 2022-03-23 MED ORDER — MAGNESIUM SULFATE 2 GM/50ML IV SOLN
2.0000 g | Freq: Once | INTRAVENOUS | Status: AC
  Administered 2022-03-23: 2 g via INTRAVENOUS
  Filled 2022-03-23: qty 50

## 2022-03-23 MED ORDER — LEVALBUTEROL HCL 0.63 MG/3ML IN NEBU
0.6300 mg | INHALATION_SOLUTION | Freq: Four times a day (QID) | RESPIRATORY_TRACT | Status: DC
Start: 2022-03-23 — End: 2022-03-24
  Administered 2022-03-23 – 2022-03-24 (×2): 0.63 mg via RESPIRATORY_TRACT
  Filled 2022-03-23 (×2): qty 3

## 2022-03-23 MED ORDER — LEVALBUTEROL HCL 0.63 MG/3ML IN NEBU
0.6300 mg | INHALATION_SOLUTION | Freq: Once | RESPIRATORY_TRACT | Status: AC
  Administered 2022-03-23: 0.63 mg via RESPIRATORY_TRACT
  Filled 2022-03-23: qty 3

## 2022-03-23 MED ORDER — AZITHROMYCIN 250 MG PO TABS
500.0000 mg | ORAL_TABLET | Freq: Every day | ORAL | Status: AC
  Administered 2022-03-24: 500 mg via ORAL
  Filled 2022-03-23: qty 2

## 2022-03-23 MED ORDER — METHYLPREDNISOLONE SODIUM SUCC 125 MG IJ SOLR
125.0000 mg | Freq: Once | INTRAMUSCULAR | Status: AC
  Administered 2022-03-23: 125 mg via INTRAVENOUS
  Filled 2022-03-23: qty 2

## 2022-03-23 MED ORDER — ATORVASTATIN CALCIUM 40 MG PO TABS
80.0000 mg | ORAL_TABLET | Freq: Every morning | ORAL | Status: DC
Start: 2022-03-24 — End: 2022-03-25
  Administered 2022-03-24 – 2022-03-25 (×2): 80 mg via ORAL
  Filled 2022-03-23 (×2): qty 2

## 2022-03-23 MED ORDER — ENOXAPARIN SODIUM 40 MG/0.4ML IJ SOSY
40.0000 mg | PREFILLED_SYRINGE | INTRAMUSCULAR | Status: DC
  Administered 2022-03-23 – 2022-03-24 (×2): 40 mg via SUBCUTANEOUS
  Filled 2022-03-23 (×2): qty 0.4

## 2022-03-23 NOTE — H&P (Signed)
?History and Physical  ? ? ?Patient: Stacey [name] YQI:[id number] DOB: [birth year] ?DOA: 03/23/2022 ?DOS: the patient was seen and examined on 03/23/2022 ?PCP: Sela Hilding  ?Patient coming from: Home ? ?Chief Complaint:  ?Chief Complaint  ?Patient presents with  ? Shortness of Breath  ? Nasal Congestion  ? ?HPI: [name] is a 76 y.o. female with medical history significant of hypertension, hyperlipidemia, CAD, breast cancer (follows with Dr. Delton Coombes) who presents to the emergency department due to several day onset of nonproductive cough with chest discomfort.  Patient states that she was exposed to pollen about 4 days ago and complained of onset of nasal congestion with nonproductive cough which has resulted in pleuritic chest pain and neck pain.  She has tried several over-the-counter cough medications without improvement.  BP increases, O2 sats drop momentarily when she has coughing fits.  Patient is bedbound at baseline due to her leg weakness and arthritis.  She denies fever, headache, blurry vision, nausea, vomiting, abdominal pain, diarrhea or constipation. ? ?ED Course:  ?In the emergency department, BP was elevated at 180/91, other vital signs were within normal range.  Work-up in the ED showed macrocytic anemia and normal BMP, BNP 116, troponin x1 was negative.  Influenza A, B, SARS coronavirus 2 was negative. ?Chest x-ray showed no active disease ?She was provided with breathing treatment, magnesium was given and Solu-Medrol was given as well.  Hospitalist was asked to admit patient for further evaluation and management. ? ?Review of Systems: ?Review of systems as noted in the HPI. All other systems reviewed and are negative. ? ? ?Past Medical History:  ?Diagnosis Date  ? CAD in native artery   ? a. DES to ramus 2005 with late stent thrombosis 2006 tx with PTCA. 12/18 PCI/DES x1 to mRCA, EF 50-55%  ? CHF (congestive heart failure) ([place])   ? Chronic pain   ? COPD (chronic obstructive  pulmonary disease) ([place])   ? Hyperlipidemia   ? Hypertension   ? Left bundle branch block   ? Lymphedema   ? Metastatic breast cancer   ? a. to bone.  ? MI (myocardial infarction) ([place])   ? Mild aortic stenosis 10/2017  ? Morbid obesity ([place])   ? PAF (paroxysmal atrial fibrillation) ([place])   ? PSVT (paroxysmal supraventricular tachycardia) ([place])   ? a. per [facility] notes, seen on event monitor in 2014.  ? Pulmonary nodules   ? ?Past Surgical History:  ?Procedure Laterality Date  ? CHOLECYSTECTOMY N/A 03/29/2021  ? Procedure: LAPAROSCOPIC CHOLECYSTECTOMY;  Surgeon: [name], MD;  Location: [facility];  Service: General;  Laterality: N/A;  ? CORONARY STENT INTERVENTION N/A 11/26/2017  ? Procedure: CORONARY STENT INTERVENTION;  Surgeon: Martinique, Peter M, MD;  Location: [facility];  Service: Cardiovascular;  Laterality: N/A;  ? CORONARY STENT PLACEMENT    ? ERCP N/A 03/28/2021  ? Procedure: ENDOSCOPIC RETROGRADE CHOLANGIOPANCREATOGRAPHY (ERCP);  Surgeon: Rogene Houston, MD;  Location: [facility];  Service: Endoscopy;  Laterality: N/A;  ? intrathecal pain pump    ? LEFT HEART CATH AND CORONARY ANGIOGRAPHY N/A 11/26/2017  ? Procedure: LEFT HEART CATH AND CORONARY ANGIOGRAPHY;  Surgeon: Martinique, Peter M, MD;  Location: [facility];  Service: Cardiovascular;  Laterality: N/A;  ? REMOVAL OF STONES  03/28/2021  ? Procedure: REMOVAL OF STONES;  Surgeon: Rogene Houston, MD;  Location: [facility];  Service: Endoscopy;;  ? SPHINCTEROTOMY  03/28/2021  ? Procedure: SPHINCTEROTOMY;  Surgeon: Rogene Houston,  MD;  Location: [facility];  Service: Endoscopy;;  ? UMBILICAL HERNIA REPAIR N/A 04/03/2021  ? Procedure: ADULT PRIMARY UMBILICAL HERNIA REPAIR;  Surgeon: [name], MD;  Location: [facility];  Service: General;  Laterality: N/A;  ? VASCULAR SURGERY    ? ? ?Social History:  reports that she quit smoking about 41 years ago. Her smoking use included cigarettes. She has a 36.00 pack-year smoking history. She has never used  smokeless tobacco. She reports current drug use. Drugs: Oxycodone and Morphine. She reports that she does not drink alcohol. ? ? ?Allergies  ?Allergen Reactions  ? Brilinta [Ticagrelor] Diarrhea and Nausea Only  ?  Nausea and severe diarrhea- general weakness. Patient says does not want to take again  ? Budesonide Palpitations  ? Albuterol Other (See Comments)  ?  Heart racing ?  ? ? ?Family History  ?Problem Relation Age of Onset  ? CAD Father 18  ? Heart attack Father   ? COPD Sister   ? CAD Paternal Grandmother   ? Sudden Cardiac Death Neg Hx   ? Colon polyps Neg Hx   ? Colon cancer Neg Hx   ?  ? ?Prior to Admission medications   ?Medication Sig Start Date End Date Taking? Authorizing Provider  ?acetaminophen (TYLENOL) 325 MG tablet Take 2 tablets (650 mg total) by mouth every 6 (six) hours as needed for mild pain, fever or headache. 04/05/21  Yes Emokpae, Courage, MD  ?amLODipine (NORVASC) 5 MG tablet TAKE 1 TABLET (5 MG TOTAL) BY MOUTH DAILY 01/27/22  Yes Branch, Alphonse Guild, MD  ?aspirin EC 81 MG tablet Take 1 tablet (81 mg total) by mouth daily with breakfast. Swallow whole. 04/07/21  Yes Emokpae, Courage, MD  ?atorvastatin (LIPITOR) 80 MG tablet TAKE 1 TABLET EVERY MORNING 07/03/21  Yes Branch, Alphonse Guild, MD  ?b complex vitamins tablet Take 1 tablet by mouth every morning. 07/05/20  Yes Florencia Reasons, MD  ?carboxymethylcellulose (REFRESH PLUS) 0.5 % SOLN Place 1 drop into both eyes in the morning, at noon, in the evening, and at bedtime.   Yes [provider]  ?clopidogrel (PLAVIX) 75 MG tablet TAKE 1 TABLET EVERY DAY 07/03/21  Yes Branch, Alphonse Guild, MD  ?DULoxetine (CYMBALTA) 30 MG capsule Take 3 capsules (90 mg total) by mouth daily. ?Patient taking differently: Take 30 mg by mouth 2 (two) times daily. 07/06/20  Yes Florencia Reasons, MD  ?losartan (COZAAR) 100 MG tablet Take 1 tablet (100 mg total) by mouth daily. ?Patient taking differently: Take 50 mg by mouth daily. 07/11/21  Yes BranchAlphonse Guild, MD  ?metoprolol  succinate (TOPROL-XL) 100 MG 24 hr tablet TAKE 1 AND 1/2 TABLETS DAILY (DOSE INCREASE) 06/19/21  Yes Branch, Alphonse Guild, MD  ?Multiple Vitamin (MULTIVITAMIN ADULT PO) Take 1 tablet by mouth daily.   Yes [provider]  ?Multiple Vitamins-Minerals (EQ VISION FORMULA 50+ PO) Take 1 tablet by mouth every morning.   Yes [provider]  ?nitroGLYCERIN (NITROSTAT) 0.4 MG SL tablet Place 1 tablet (0.4 mg total) under the tongue every 5 (five) minutes x 3 doses as needed for chest pain (if no relief after 3rd dose, proceed to the ED or call 911). 07/11/21  Yes Branch, Alphonse Guild, MD  ?PAIN MANAGEMENT INTRATHECAL, IT, PUMP by Intrathecal route. 02/21/21  Yes [provider]  ?pregabalin (LYRICA) 50 MG capsule Take 50 mg by mouth 3 (three) times daily. 01/26/21  Yes [provider]  ? ? ?Physical Exam: ?BP (!) 197/89  Pulse 98   Temp 98.3 ?F (36.8 ?C) (Oral)   Resp 18   Ht 5' (1.524 m)   Wt 93 kg   SpO2 92%   BMI 40.04 kg/m?  ? ?General: 76 y.o. year-old female well developed well nourished in no acute distress.  Alert and oriented x3. ?HEENT: NCAT, EOMI ?Neck: Supple, trachea medial ?Cardiovascular: Regular rate and rhythm with no rubs or gallops.  No thyromegaly or JVD noted.  No lower extremity edema. 2/4 pulses in all 4 extremities. ?Respiratory: Diffuse wheezing on auscultation.  No rales. ?Abdomen: Soft, nontender nondistended with normal bowel sounds x4 quadrants. ?Muskuloskeletal: No cyanosis, clubbing or edema noted bilaterally ?Neuro: CN II-XII intact, strength 5/5 x 4, sensation, reflexes intact ?Skin: No ulcerative lesions noted or rashes ?Psychiatry: Judgement and insight appear normal. Mood is appropriate for condition and setting ?   ?   ?   ?Labs on Admission:  ?Basic Metabolic Panel: ?Recent Labs  ?Lab 03/18/22 ?1326 03/23/22 ?1842  ?NA 139 141  ?K 5.1 4.7  ?CL 101 105  ?CO2 32 32  ?GLUCOSE 106* 111*  ?BUN 19 13  ?CREATININE 0.67 0.55  ?CALCIUM 9.1 9.4  ? ?Liver  Function Tests: ?Recent Labs  ?Lab 03/18/22 ?1326  ?AST 19  ?ALT 13  ?ALKPHOS 43  ?BILITOT 1.1  ?PROT 6.0*  ?ALBUMIN 3.2*  ? ?No results for input(s): LIPASE, AMYLASE in the last 168 hours. ?No results for input(s): AMMONI

## 2022-03-23 NOTE — ED Triage Notes (Signed)
Pt, from home, c/o increasing SOB and nasal/chest congestion x4 days.  Reports pain in neck and bilateral ribcage d/t coughing.  Congested cough noted.  Pt reports taking several OTC medications w/o relief.  ? ?Hx of breast CA.  Pt has a treatment "earlier this month."   ?

## 2022-03-23 NOTE — ED Provider Notes (Signed)
?[facility] ?Provider Note ? ? ?CSN: [id number] ?Arrival date & time: 03/23/22  1803 ? ?  ? ?History ? ?Chief Complaint  ?Patient presents with  ? Shortness of Breath  ? Nasal Congestion  ? ? ?Stacey [name] is a 76 y.o. female.  She has a history of breast cancer A-fib COPD CHF.  She is complaining of cough and discomfort in her chest from coughing.  Its been nonproductive.  No fevers or chills.  Been going on 3 to 4 days.  Has tried over-the-counter medications without any improvement.  She is most bedbound due to weakness in her legs and arthritis.  He is on chronic pain medicine via pain pump. ? ?The history is provided by the patient and the EMS personnel.  ?Shortness of Breath ?Severity:  Moderate ?Onset quality:  Gradual ?Duration:  4 days ?Timing:  Constant ?Progression:  Worsening ?Chronicity:  New ?Relieved by:  Nothing ?Worsened by:  Coughing ?Ineffective treatments:  Rest and position changes ?Associated symptoms: chest pain and neck pain   ?Associated symptoms: no fever, no headaches, no hemoptysis, no rash, no sore throat, no sputum production and no vomiting   ?Risk factors: prolonged immobilization   ?Risk factors: no tobacco use   ? ?  ? ?Home Medications ?Prior to Admission medications   ?Medication Sig Start Date End Date Taking? Authorizing Provider  ?acetaminophen (TYLENOL) 325 MG tablet Take 2 tablets (650 mg total) by mouth every 6 (six) hours as needed for mild pain, fever or headache. 04/05/21   Roxan Hockey, MD  ?amLODipine (NORVASC) 5 MG tablet TAKE 1 TABLET (5 MG TOTAL) BY MOUTH DAILY 01/27/22   Arnoldo Lenis, MD  ?aspirin EC 81 MG tablet Take 1 tablet (81 mg total) by mouth daily with breakfast. Swallow whole. 04/07/21   Roxan Hockey, MD  ?atorvastatin (LIPITOR) 80 MG tablet TAKE 1 TABLET EVERY MORNING 07/03/21   Arnoldo Lenis, MD  ?b complex vitamins tablet Take 1 tablet by mouth every morning. 07/05/20   Florencia Reasons, MD  ?carboxymethylcellulose (REFRESH  PLUS) 0.5 % SOLN Place 1 drop into both eyes in the morning, at noon, in the evening, and at bedtime.    [provider]  ?clopidogrel (PLAVIX) 75 MG tablet TAKE 1 TABLET EVERY DAY 07/03/21   Arnoldo Lenis, MD  ?DULoxetine (CYMBALTA) 30 MG capsule Take 3 capsules (90 mg total) by mouth daily. ?Patient taking differently: Take 30 mg by mouth daily. 07/06/20   Florencia Reasons, MD  ?losartan (COZAAR) 100 MG tablet Take 1 tablet (100 mg total) by mouth daily. ?Patient taking differently: Take 50 mg by mouth daily. 07/11/21   Arnoldo Lenis, MD  ?losartan (COZAAR) 50 MG tablet Take by mouth. 10/17/21   [provider]  ?metoprolol succinate (TOPROL-XL) 100 MG 24 hr tablet TAKE 1 AND 1/2 TABLETS DAILY (DOSE INCREASE) 06/19/21   Arnoldo Lenis, MD  ?Multiple Vitamin (MULTIVITAMIN ADULT PO) Take 1 tablet by mouth daily.    [provider]  ?Multiple Vitamins-Minerals (EQ VISION FORMULA 50+ PO) Take 1 tablet by mouth every morning.    [provider]  ?nitroGLYCERIN (NITROSTAT) 0.4 MG SL tablet Place 1 tablet (0.4 mg total) under the tongue every 5 (five) minutes x 3 doses as needed for chest pain (if no relief after 3rd dose, proceed to the ED or call 911). 07/11/21   Branch, Alphonse Guild, MD  ?PAIN MANAGEMENT INTRATHECAL, IT, PUMP by Intrathecal route. 02/21/21   [provider]  ?polyethylene glycol powder (GLYCOLAX/MIRALAX) 17 GM/SCOOP powder Take 17 g by mouth daily as needed for moderate constipation.    [provider]  ?pregabalin (LYRICA) 50 MG capsule Take 50 mg by mouth 3 (three) times daily. 01/26/21   [provider]  ?   ? ?Allergies    ?Brilinta [ticagrelor], Budesonide, and Albuterol   ? ?Review of Systems   ?Review of Systems  ?Constitutional:  Negative for fever.  ?HENT:  Negative for sore throat.   ?Eyes:  Negative for visual disturbance.  ?Respiratory:  Positive for shortness of breath. Negative for hemoptysis and sputum production.    ?Cardiovascular:  Positive for chest pain.  ?Gastrointestinal:  Negative for nausea and vomiting.  ?Genitourinary:  Negative for dysuria.  ?Musculoskeletal:  Positive for gait problem and neck pain.  ?Skin:  Negative for rash.  ?Neurological:  Negative for headaches.  ? ?Physical Exam ?Updated Vital Signs ?BP (!) 169/95 (BP Location: Left Arm)   Pulse 86   Temp 98.3 ?F (36.8 ?C) (Oral)   Resp 18   Ht 5' (1.524 m)   Wt 93 kg   SpO2 96%   BMI 40.04 kg/m?  ?Physical Exam ?Vitals and nursing note reviewed.  ?Constitutional:   ?   General: She is not in acute distress. ?   Appearance: She is well-developed.  ?HENT:  ?   Head: Normocephalic and atraumatic.  ?Eyes:  ?   Conjunctiva/sclera: Conjunctivae normal.  ?Cardiovascular:  ?   Rate and Rhythm: Normal rate and regular rhythm.  ?   Heart sounds: No murmur heard. ?Pulmonary:  ?   Effort: Pulmonary effort is normal. No respiratory distress.  ?   Breath sounds: Wheezing present.  ?Abdominal:  ?   Palpations: Abdomen is soft.  ?   Tenderness: There is no abdominal tenderness.  ?Musculoskeletal:     ?   General: No swelling.  ?   Cervical back: Neck supple.  ?   Right lower leg: No tenderness.  ?   Left lower leg: No tenderness.  ?Skin: ?   General: Skin is warm and dry.  ?   Capillary Refill: Capillary refill takes less than 2 seconds.  ?Neurological:  ?   General: No focal deficit present.  ?   Mental Status: She is alert.  ? ? ?ED Results / Procedures / Treatments   ?Labs ?(all labs ordered are listed, but only abnormal results are displayed) ?Labs Reviewed  ?BASIC METABOLIC PANEL - Abnormal; Notable for the following components:  ?    Result Value  ? Glucose, Bld 111 (*)   ? Anion gap 4 (*)   ? All other components within normal limits  ?CBC WITH DIFFERENTIAL/PLATELET - Abnormal; Notable for the following components:  ? RBC 3.28 (*)   ? Hemoglobin 10.8 (*)   ? HCT 33.0 (*)   ? MCV 100.6 (*)   ? All other components within normal limits  ?BRAIN NATRIURETIC  PEPTIDE - Abnormal; Notable for the following components:  ? B Natriuretic Peptide 116.0 (*)   ? All other components within normal limits  ?VITAMIN B12 - Abnormal; Notable for the following components:  ? Vitamin B-12 3,882 (*)   ? All other components within normal limits  ?COMPREHENSIVE METABOLIC PANEL - Abnormal; Notable for the following components:  ? Glucose, Bld 220 (*)   ? Albumin 3.4 (*)   ? All other components within normal limits  ?CBC - Abnormal; Notable for the following components:  ?  RBC 3.53 (*)   ? Hemoglobin 11.2 (*)   ? HCT 35.1 (*)   ? All other components within normal limits  ?APTT - Abnormal; Notable for the following components:  ? aPTT 38 (*)   ? All other components within normal limits  ?RESP PANEL BY RT-PCR (FLU A&B, COVID) [id number]  ?FOLATE  ?MAGNESIUM  ?PHOSPHORUS  ?TROPONIN I (HIGH SENSITIVITY)  ?TROPONIN I (HIGH SENSITIVITY)  ? ? ?EKG ?None ?EKG ordered and not obtained ?Radiology ?DG Chest Port 1 View ? ?Result Date: 03/23/2022 ?CLINICAL DATA:  Shortness of breath. EXAM: PORTABLE CHEST 1 VIEW COMPARISON:  May 01, 2021 FINDINGS: Tortuosity of the aorta. Cardiomediastinal silhouette is normal. Mediastinal contours appear intact. There is no evidence of focal airspace consolidation, pleural effusion or pneumothorax. Osseous structures are without acute abnormality. Soft tissues are grossly normal. IMPRESSION: No active disease. Electronically Signed   By: Fidela Salisbury M.D.   On: 03/23/2022 18:57   ? ?Procedures ?Procedures  ? ? ?Medications Ordered in ED ?Medications  ?enoxaparin (LOVENOX) injection 40 mg (40 mg Subcutaneous Given 03/23/22 2306)  ?dextromethorphan-guaiFENesin (MUCINEX DM) 30-600 MG per 12 hr tablet 1 tablet (1 tablet Oral Given 03/24/22 0808)  ?methylPREDNISolone sodium succinate (SOLU-MEDROL) 40 mg/mL injection 40 mg (40 mg Intravenous Given 03/24/22 0806)  ?azithromycin (ZITHROMAX) tablet 500 mg (500 mg Oral Given 03/24/22 0815)  ?  Followed by  ?azithromycin  (ZITHROMAX) tablet 250 mg (has no administration in time range)  ?pantoprazole (PROTONIX) EC tablet 40 mg (40 mg Oral Given 03/24/22 0815)  ?guaiFENesin-dextromethorphan (ROBITUSSIN DM) 100-10 MG/5ML syrup 5 mL (has no administration in

## 2022-03-23 NOTE — ED Notes (Signed)
Unsuccessful IV attempt x2.  Will have another RN attempt.  ?

## 2022-03-24 DIAGNOSIS — Z825 Family history of asthma and other chronic lower respiratory diseases: Secondary | ICD-10-CM | POA: Diagnosis not present

## 2022-03-24 DIAGNOSIS — Z8249 Family history of ischemic heart disease and other diseases of the circulatory system: Secondary | ICD-10-CM | POA: Diagnosis not present

## 2022-03-24 DIAGNOSIS — Z170 Estrogen receptor positive status [ER+]: Secondary | ICD-10-CM | POA: Diagnosis not present

## 2022-03-24 DIAGNOSIS — Z87891 Personal history of nicotine dependence: Secondary | ICD-10-CM | POA: Diagnosis not present

## 2022-03-24 DIAGNOSIS — J9801 Acute bronchospasm: Secondary | ICD-10-CM | POA: Diagnosis present

## 2022-03-24 DIAGNOSIS — J441 Chronic obstructive pulmonary disease with (acute) exacerbation: Secondary | ICD-10-CM | POA: Diagnosis present

## 2022-03-24 DIAGNOSIS — Z7902 Long term (current) use of antithrombotics/antiplatelets: Secondary | ICD-10-CM | POA: Diagnosis not present

## 2022-03-24 DIAGNOSIS — Z20822 Contact with and (suspected) exposure to covid-19: Secondary | ICD-10-CM | POA: Diagnosis present

## 2022-03-24 DIAGNOSIS — I2510 Atherosclerotic heart disease of native coronary artery without angina pectoris: Secondary | ICD-10-CM | POA: Diagnosis present

## 2022-03-24 DIAGNOSIS — E782 Mixed hyperlipidemia: Secondary | ICD-10-CM | POA: Diagnosis present

## 2022-03-24 DIAGNOSIS — Z7982 Long term (current) use of aspirin: Secondary | ICD-10-CM | POA: Diagnosis not present

## 2022-03-24 DIAGNOSIS — I252 Old myocardial infarction: Secondary | ICD-10-CM | POA: Diagnosis not present

## 2022-03-24 DIAGNOSIS — I480 Paroxysmal atrial fibrillation: Secondary | ICD-10-CM | POA: Diagnosis present

## 2022-03-24 DIAGNOSIS — C50911 Malignant neoplasm of unspecified site of right female breast: Secondary | ICD-10-CM | POA: Diagnosis present

## 2022-03-24 DIAGNOSIS — Z7401 Bed confinement status: Secondary | ICD-10-CM | POA: Diagnosis not present

## 2022-03-24 DIAGNOSIS — G8929 Other chronic pain: Secondary | ICD-10-CM | POA: Diagnosis present

## 2022-03-24 DIAGNOSIS — Z955 Presence of coronary angioplasty implant and graft: Secondary | ICD-10-CM | POA: Diagnosis not present

## 2022-03-24 DIAGNOSIS — I10 Essential (primary) hypertension: Secondary | ICD-10-CM | POA: Diagnosis present

## 2022-03-24 DIAGNOSIS — Z79899 Other long term (current) drug therapy: Secondary | ICD-10-CM | POA: Diagnosis not present

## 2022-03-24 DIAGNOSIS — D539 Nutritional anemia, unspecified: Secondary | ICD-10-CM | POA: Diagnosis present

## 2022-03-24 DIAGNOSIS — Z888 Allergy status to other drugs, medicaments and biological substances status: Secondary | ICD-10-CM | POA: Diagnosis not present

## 2022-03-24 DIAGNOSIS — Z6841 Body Mass Index (BMI) 40.0 and over, adult: Secondary | ICD-10-CM | POA: Diagnosis not present

## 2022-03-24 LAB — CBC
HCT: 35.1 % — ABNORMAL LOW (ref 36.0–46.0)
Hemoglobin: 11.2 g/dL — ABNORMAL LOW (ref 12.0–15.0)
MCH: 31.7 pg (ref 26.0–34.0)
MCHC: 31.9 g/dL (ref 30.0–36.0)
MCV: 99.4 fL (ref 80.0–100.0)
Platelets: 201 10*3/uL (ref 150–400)
RBC: 3.53 MIL/uL — ABNORMAL LOW (ref 3.87–5.11)
RDW: 14.1 % (ref 11.5–15.5)
WBC: 4.6 10*3/uL (ref 4.0–10.5)
nRBC: 0 % (ref 0.0–0.2)

## 2022-03-24 LAB — COMPREHENSIVE METABOLIC PANEL
ALT: 17 U/L (ref 0–44)
AST: 32 U/L (ref 15–41)
Albumin: 3.4 g/dL — ABNORMAL LOW (ref 3.5–5.0)
Alkaline Phosphatase: 51 U/L (ref 38–126)
Anion gap: 10 (ref 5–15)
BUN: 13 mg/dL (ref 8–23)
CO2: 27 mmol/L (ref 22–32)
Calcium: 9.1 mg/dL (ref 8.9–10.3)
Chloride: 101 mmol/L (ref 98–111)
Creatinine, Ser: 0.69 mg/dL (ref 0.44–1.00)
GFR, Estimated: >60 mL/min (ref >60)
Glucose, Bld: 220 mg/dL — ABNORMAL HIGH (ref 70–99)
Potassium: 4.1 mmol/L (ref 3.5–5.1)
Sodium: 138 mmol/L (ref 135–145)
Total Bilirubin: 1 mg/dL (ref 0.3–1.2)
Total Protein: 6.6 g/dL (ref 6.5–8.1)

## 2022-03-24 LAB — FOLATE: Folate: 32.8 ng/mL (ref >5.9)

## 2022-03-24 LAB — APTT: aPTT: 38 seconds — ABNORMAL HIGH (ref 24–36)

## 2022-03-24 LAB — PHOSPHORUS: Phosphorus: 2.8 mg/dL (ref 2.5–4.6)

## 2022-03-24 LAB — MAGNESIUM: Magnesium: 2.2 mg/dL (ref 1.7–2.4)

## 2022-03-24 LAB — VITAMIN B12: Vitamin B-12: 3882 pg/mL — ABNORMAL HIGH (ref 180–914)

## 2022-03-24 MED ORDER — ACETAMINOPHEN 325 MG PO TABS
650.0000 mg | ORAL_TABLET | Freq: Four times a day (QID) | ORAL | Status: DC | PRN
  Administered 2022-03-24 (×2): 650 mg via ORAL
  Filled 2022-03-24 (×2): qty 2

## 2022-03-24 MED ORDER — BENZONATATE 100 MG PO CAPS
100.0000 mg | ORAL_CAPSULE | Freq: Three times a day (TID) | ORAL | Status: DC | PRN
Start: 2022-03-24 — End: 2022-03-25
  Administered 2022-03-24: 100 mg via ORAL
  Filled 2022-03-24: qty 1

## 2022-03-24 MED ORDER — LEVALBUTEROL HCL 0.63 MG/3ML IN NEBU
0.6300 mg | INHALATION_SOLUTION | Freq: Four times a day (QID) | RESPIRATORY_TRACT | Status: DC
  Administered 2022-03-24 – 2022-03-25 (×6): 0.63 mg via RESPIRATORY_TRACT
  Filled 2022-03-24 (×4): qty 3

## 2022-03-24 MED ORDER — IPRATROPIUM BROMIDE 0.02 % IN SOLN
RESPIRATORY_TRACT | Status: AC
  Administered 2022-03-24: 0.5 mg
  Filled 2022-03-24: qty 2.5

## 2022-03-24 MED ORDER — DULOXETINE HCL 30 MG PO CPEP
30.0000 mg | ORAL_CAPSULE | Freq: Two times a day (BID) | ORAL | Status: DC
  Administered 2022-03-24 – 2022-03-25 (×3): 30 mg via ORAL
  Filled 2022-03-24 (×3): qty 1

## 2022-03-24 MED ORDER — IPRATROPIUM BROMIDE 0.02 % IN SOLN
0.5000 mg | Freq: Four times a day (QID) | RESPIRATORY_TRACT | Status: DC
  Administered 2022-03-24 – 2022-03-25 (×6): 0.5 mg via RESPIRATORY_TRACT
  Filled 2022-03-24 (×4): qty 2.5

## 2022-03-24 MED ORDER — DM-GUAIFENESIN ER 30-600 MG PO TB12: 1.0000 | ORAL_TABLET | Freq: Two times a day (BID) | ORAL | Status: DC | PRN

## 2022-03-24 NOTE — Progress Notes (Signed)
?   03/24/22 2050  ?Assess: MEWS Score  ?Temp 99.3 ?F (37.4 ?C)  ?BP (!) 154/76  ?Pulse Rate (!) 118  ?Resp 20  ?SpO2 94 %  ?O2 Device Room Air  ?Assess: MEWS Score  ?MEWS Temp 0  ?MEWS Systolic 0  ?MEWS Pulse 2  ?MEWS RR 0  ?MEWS LOC 0  ?MEWS Score 2  ?MEWS Score Color Yellow  ?Assess: if the MEWS score is Yellow or Red  ?Were vital signs taken at a resting state? Yes  ?Focused Assessment No change from prior assessment  ?Early Detection of Sepsis Score *See Row Information* Low  ?MEWS guidelines implemented *See Row Information* Yes  ?Notify: Charge Nurse/RN  ?Name of Charge Nurse/RN Notified Diona Fanti, RN  ?Date Charge Nurse/RN Notified 03/24/22  ?Time Charge Nurse/RN Notified 2052  ? ? ?

## 2022-03-24 NOTE — Progress Notes (Signed)
Complained of cough this morning and said Robitussin does not help.  Later C/O right leg pain and asked for home med oxycodone 10 TID .  Received order for tessalon perles and home med cymbalta for pain.  Earlier was assisted to stand and pivot to chair and was in mid 90's on room air. States does no ambulate at home.  ?

## 2022-03-24 NOTE — TOC Initial Note (Signed)
Transition of Care (TOC) - Initial/Assessment Note  ? ? ?Patient Details  ?Name: [name] ?MRN: [id number] ?Date of Birth: 1946/07/20 ? ?Transition of Care (TOC) CM/SW Contact:    ?Iona Beard, LCSWA ?Phone Number: ?03/24/2022, 11:00 AM ? ?Clinical Narrative:                 ?CSW met with pt in room to complete assessment. Pt states she is active with [facility] for services including PT/OT/Aide. CSW to reach out to [facility] rep to inquire about pts active status with them. Pt states that she used to wear O2 at night only but has been off for a year and a half and hopes she does not need it again. Pt states that it was supplied through [facility] and she would like to use them again if she has to go home with it. TOC to follow.  ? ?Expected Discharge Plan: [place] ?Barriers to Discharge: Continued Medical Work up ? ? ?Patient Goals and CMS Choice ?Patient states their goals for this hospitalization and ongoing recovery are:: Home with HH ?CMS Medicare.gov Compare Post Acute Care list provided to:: Patient ?Choice offered to / list presented to : Patient ? ?Expected Discharge Plan and Services ?Expected Discharge Plan: [place] ?In-house Referral: Clinical Social Work ?  ?Post Acute Care Choice: Home Health ?Living arrangements for the past 2 months: [place] ?                ?  ?  ?  ?  ?  ?  ?  ?  ?  ?  ? ?Prior Living Arrangements/Services ?Living arrangements for the past 2 months: [place] ?Lives with:: Self ?Patient language and need for interpreter reviewed:: Yes ?Do you feel safe going back to the place where you live?: Yes      ?Need for Family Participation in Patient Care: Yes (Comment) ?Care giver support system in place?: Yes (comment) ?Current home services: DME ?Criminal Activity/Legal Involvement Pertinent to Current Situation/Hospitalization: No - Comment as needed ? ?Activities of Daily Living ?Home Assistive Devices/Equipment:  Wheelchair, Environmental consultant (specify type), Transfer board, Bedside commode/3-in-1 ?ADL Screening (condition at time of admission) ?Patient's cognitive ability adequate to safely complete daily activities?: Yes ?Is the patient deaf or have difficulty hearing?: No ?Does the patient have difficulty seeing, even when wearing glasses/contacts?: No ?Does the patient have difficulty concentrating, remembering, or making decisions?: No ?Patient able to express need for assistance with ADLs?: Yes ?Does the patient have difficulty dressing or bathing?: Yes ?Independently performs ADLs?: No ?Communication: Independent ?Dressing (OT): Needs assistance ?Is this a change from baseline?: Pre-admission baseline ?Grooming: Needs assistance ?Is this a change from baseline?: Pre-admission baseline ?Feeding: Independent ?Bathing: Needs assistance ?Is this a change from baseline?: Pre-admission baseline ?Toileting: Needs assistance ?Is this a change from baseline?: Pre-admission baseline ?In/Out Bed: Dependent ?Is this a change from baseline?: Change from baseline, expected to last <3 days ?Walks in Home: Needs assistance ?Is this a change from baseline?: Pre-admission baseline ?Does the patient have difficulty walking or climbing stairs?: Yes ?Weakness of Legs: Both ?Weakness of Arms/Hands: None ? ?Permission Sought/Granted ?  ?  ?   ?   ?   ?   ? ?Emotional Assessment ?Appearance:: Appears stated age ?Attitude/Demeanor/Rapport: Engaged ?Affect (typically observed): Accepting ?Orientation: : Oriented to Self, Oriented to Place, Oriented to  Time, Oriented to Situation ?Alcohol / Substance Use: Not Applicable ?Psych Involvement: No (  comment) ? ?Admission diagnosis:  Acute bronchospasm [J98.01] ?COPD exacerbation ([place]) [J44.1] ?COPD with acute exacerbation ([place]) [J44.1] ?Patient Active Problem List  ? Diagnosis Date Noted  ? Acute bronchospasm 03/23/2022  ? Elevated brain natriuretic peptide (BNP) level 03/23/2022  ? Macrocytic anemia 03/23/2022   ? Incarcerated umbilical hernia   ? Cholecystitis   ? Elevated bilirubin   ? Cholecystitis, acute with cholelithiasis 03/25/2021  ? Abdominal pain 03/25/2021  ? Leukocytosis 03/25/2021  ? Hypokalemia 03/25/2021  ? Hyperglycemia 03/25/2021  ? Chronic combined systolic and diastolic CHF (congestive heart failure) ([place]) 03/25/2021  ? Obesity, Class II, BMI 35-39.9 03/25/2021  ? Choledocholithiasis   ? Rectal bleeding 08/23/2020  ? Supratherapeutic INR 08/23/2020  ? Left hip pain   ? Pain of metastatic malignancy 06/27/2020  ? Malignant neoplasm of right breast in female, estrogen receptor positive ([place])   ? Goals of care, counseling/discussion   ? Advanced care planning/counseling discussion   ? Spinal stenosis of lumbar region   ? Bone metastases   ? Palliative care by specialist   ? Lumbar pain 04/24/2020  ? Lumbar radiculopathy 02/16/2020  ? Palpitations 11/16/2019  ? Dysuria   ? Acute bilateral low back pain 10/02/2019  ? Bipolar 2 disorder, major depressive episode ([place]) 09/01/2019  ? Adjustment disorder with depressed mood 06/30/2019  ? Atrial fibrillation with rapid ventricular response ([place]) 11/14/2018  ? Fall   ? Obesity, Class III, BMI 40-49.9 (morbid obesity) ([place])   ? Chronic respiratory failure with hypoxia (H[facility])   ? Abnormal transaminases 10/07/2018  ? PSVT (paroxysmal supraventricular tachycardia) ([place]) 10/07/2018  ? Mixed hyperlipidemia 10/07/2018  ? COPD (chronic obstructive pulmonary disease) ([place]) 10/07/2018  ? CAD in native artery 10/07/2018  ? Chronic pain 10/07/2018  ? Acute back pain 10/07/2018  ? Acute hypoxemic respiratory failure ([place]) 09/19/2018  ? Acute on chronic combined systolic and diastolic CHF (congestive heart failure) ([place]) 09/19/2018  ? COPD with acute exacerbation ([place]) 09/19/2018  ? Dyspnea 11/30/2017  ? Non-ST elevation (NSTEMI) myocardial infarction [facility])   ? Essential hypertension 11/23/2017  ? Left bundle branch block 11/23/2017  ? Acute on chronic diastolic CHF (congestive  heart failure) ([place]) 11/23/2017  ? CAD (coronary artery disease) 11/23/2017  ? Metastatic breast cancer 11/23/2017  ? Acute respiratory failure with hypoxia ([place]) 11/23/2017  ? Atypical nevus 07/29/2016  ? Age-related nuclear cataract, bilateral 02/12/2016  ? Anxiety and depression 01/24/2016  ? Arthropathy, lower leg 09/14/2013  ? Tibialis tendinitis 02/22/2013  ? Plantar fasciitis 01/17/2013  ? ?PCP:  Sela Hilding ?Pharmacy:   ?[facility], [place] [address] ?[address] ?[place] [address] ?Phone: [phone] Fax: [phone] ? ?[place], [place] ?[place] ?[place] [address] ?Phone: [phone] Fax: [phone] ? ? ? ? ?Social Determinants of Health (SDOH) Interventions ?  ? ?Readmission Risk Interventions ? ?  07/03/2020  ?  1:33 PM 04/26/2020  ?  4:24 PM  ?Readmission Risk Prevention Plan  ?Transportation Screening Complete Complete  ?[place] or Home Care Consult  Complete  ?Social Work Consult for [place] Planning/Counseling  Complete  ?Palliative Care Screening  Complete  ?Medication Review Press photographer) Complete Complete  ?PCP or Specialist appointment within 3-5 days of discharge Complete   ?[place] or Home Care Consult Complete   ?SW Recovery Care/Counseling Consult Complete   ?Palliative Care Screening Complete   ?[place] Not Applicable   ? ? ? ?

## 2022-03-24 NOTE — Progress Notes (Signed)
?PROGRESS NOTE ? ? ? ?[name]  IRJ:[id number] DOB: 05-18-1946 DOA: 03/23/2022 ?PCP: Sela Hilding ? ? ?Brief Narrative:  ?Stacey [name] is a 76 y.o. female with medical history significant of hypertension, hyperlipidemia, CAD, breast cancer (follows with Dr. Delton Coombes) who presents to the emergency department due to several day onset of nonproductive cough and wheeze.  Patient indicates she has not left her house in "years" and blames her current symptoms on being outside during pollen season with multiple flowers blooming in her front yard.  Patient indicates she became hypoxic with coughing fit at home. Patient is bedbound at baseline due to her leg weakness and arthritis.  She denies fever, headache, blurry vision, nausea, vomiting, abdominal pain, diarrhea or constipation. ? ?Assessment & Plan: ?  ?Principal Problem: ?  Acute bronchospasm ?Active Problems: ?  Essential hypertension ?  CAD (coronary artery disease) ?  Mixed hyperlipidemia ?  Obesity, Class III, BMI 40-49.9 (morbid obesity) ([place]) ?  Malignant neoplasm of right breast in female, estrogen receptor positive ([place]) ?  Elevated brain natriuretic peptide (BNP) level ?  Macrocytic anemia ? ? ? ?Acute bronchospasm ?Cannot rule out COPD exacerbation(questionable diagnosis), POA ?Likely allergic in nature ?Continue supportive care, high-dose steroids, fulminant expiratory wheeze ongoing ?Patient without hypoxia currently, ambulatory status limited, ambulation hypoxia screening unable to be performed ?Questionable history of COPD per previous documentation - continue Xopenex, Mucinex, Solu-Medrol, azithromycin. ? ?Chronically elevated BNP ?BNP 116 (this was 552.6 about 3 years ago) ?She denies leg swelling, abdominal distention, dyspnea on exertion, orthopnea ?Continue total input/output, daily weights and fluid restriction ?Continue Cardiac diet ? ?Macrocytic anemia, chronic ?B12, folate levels within normal limits ?  ?Essential  hypertension (uncontrolled) ?Continue amlodipine, losartan and Toprol-XL ? ?Mixed hyperlipidemia ?Continue Lipitor ? ?CAD ?Continue aspirin, Lipitor and Toprol-XL ?  ?Malignant neoplasm of right breast  ?Patient follows with Dr. Delton Coombes ? ?Obesity (BMI 40.04 kg/m?) ?Diet and lifestyle modification discussion at bedside ? ?Chronic ambulatory dysfunction ?PT requested to follow, unclear baseline ambulatory status, patient states she cannot walk but has been able to stand and pivot with nursing today at bedside ? ?DVT prophylaxis: Lovenox ?Code Status: Full ?Family Communication: None present ? ?Status is: Inpatient ? ?Dispo: The patient is from: Home ?             Anticipated d/c is to: Home ?             Anticipated d/c date is: 24 to 48 hours ?             Patient currently not medically stable for discharge ? ?Consultants:  ?None ? ?Procedures:  ?None ? ?Antimicrobials:  ?Azithromycin x5 days ? ?Subjective: ?No acute issues or events overnight, patient still feels quite short of breath although without hypoxia today at bedside denies nausea vomiting diarrhea constipation headache fevers chills or chest pain ? ?Objective: ?Vitals:  ? 03/23/22 2315 03/24/22 0224 03/24/22 0231 03/24/22 0500  ?BP:   (!) 154/75   ?Pulse:   (!) 110   ?Resp:      ?Temp:   98.2 ?F (36.8 ?C)   ?TempSrc:   Oral   ?SpO2: 95% 96% 100%   ?Weight:    88.2 kg  ?Height:      ? ? ?Intake/Output Summary (Last 24 hours) at 03/24/2022 3016 ?Last data filed at 03/23/2022 2014 ?Gross per 24 hour  ?Intake 50 ml  ?Output --  ?Net 50 ml  ? ?Filed Weights  ? 03/23/22 1816 03/23/22 2300  03/24/22 0500  ?Weight: 93 kg 88.4 kg 88.2 kg  ? ? ?Examination: ? ?General:  Pleasantly resting in bed, No acute distress. ?HEENT:  Normocephalic atraumatic.  Sclerae nonicteric, noninjected.  Extraocular movements intact bilaterally. ?Neck:  Without mass or deformity.  Trachea is midline. ?Lungs: Profound expiratory wheeze. ?Heart:  Regular rate and rhythm.  Without  murmurs, rubs, or gallops. ?Abdomen:  Soft, nontender, nondistended.  Without guarding or rebound. ?Extremities: Without cyanosis, clubbing, edema, or obvious deformity. ?Vascular:  Dorsalis pedis and posterior tibial pulses palpable bilaterally. ?Skin:  Warm and dry, no erythema, no ulcerations. ? ? ?Data Reviewed: I have personally reviewed following labs and imaging studies ? ?CBC: ?Recent Labs  ?Lab 03/23/22 ?1842 03/24/22 ?2979  ?WBC 4.9 4.6  ?NEUTROABS 3.3  --   ?HGB 10.8* 11.2*  ?HCT 33.0* 35.1*  ?MCV 100.6* 99.4  ?PLT 202 201  ? ?Basic Metabolic Panel: ?Recent Labs  ?Lab 03/18/22 ?1326 03/23/22 ?1842 03/24/22 ?0419  ?NA 139 141 138  ?K 5.1 4.7 4.1  ?CL 101 105 101  ?CO2 32 32 27  ?GLUCOSE 106* 111* 220*  ?BUN '19 13 13  '$ ?CREATININE 0.67 0.55 0.69  ?CALCIUM 9.1 9.4 9.1  ?MG  --   --  2.2  ?PHOS  --   --  2.8  ? ?GFR: ?Estimated Creatinine Clearance: 60 mL/min (by C-G formula based on SCr of 0.69 mg/dL). ?Liver Function Tests: ?Recent Labs  ?Lab 03/18/22 ?1326 03/24/22 ?0419  ?AST 19 32  ?ALT 13 17  ?ALKPHOS 43 51  ?BILITOT 1.1 1.0  ?PROT 6.0* 6.6  ?ALBUMIN 3.2* 3.4*  ? ?No results for input(s): LIPASE, AMYLASE in the last 168 hours. ?No results for input(s): AMMONIA in the last 168 hours. ?Coagulation Profile: ?No results for input(s): INR, PROTIME in the last 168 hours. ?Cardiac Enzymes: ?No results for input(s): CKTOTAL, CKMB, CKMBINDEX, TROPONINI in the last 168 hours. ?BNP (last 3 results) ?No results for input(s): PROBNP in the last 8760 hours. ?HbA1C: ?No results for input(s): HGBA1C in the last 72 hours. ?CBG: ?No results for input(s): GLUCAP in the last 168 hours. ?Lipid Profile: ?No results for input(s): CHOL, HDL, LDLCALC, TRIG, CHOLHDL, LDLDIRECT in the last 72 hours. ?Thyroid Function Tests: ?No results for input(s): TSH, T4TOTAL, FREET4, T3FREE, THYROIDAB in the last 72 hours. ?Anemia Panel: ?Recent Labs  ?  03/24/22 ?0419  ?FOLATE 32.8  ? ?Sepsis Labs: ?No results for input(s): PROCALCITON,  LATICACIDVEN in the last 168 hours. ? ?Recent Results (from the past 240 hour(s))  ?Resp Panel by RT-PCR (Flu A&B, Covid) Nasopharyngeal Swab     Status: None  ? Collection Time: 03/23/22  6:19 PM  ? Specimen: Nasopharyngeal Swab; Nasopharyngeal(NP) swabs in vial transport medium  ?Result Value Ref Range Status  ? SARS Coronavirus 2 by RT PCR NEGATIVE NEGATIVE Final  ?  Comment: (NOTE) ?SARS-CoV-2 target nucleic acids are NOT DETECTED. ? ?The SARS-CoV-2 RNA is generally detectable in upper respiratory ?specimens during the acute phase of infection. The lowest ?concentration of SARS-CoV-2 viral copies this assay can detect is ?138 copies/mL. A negative result does not preclude SARS-Cov-2 ?infection and should not be used as the sole basis for treatment or ?other patient management decisions. A negative result may occur with  ?improper specimen collection/handling, submission of specimen other ?than nasopharyngeal swab, presence of viral mutation(s) within the ?areas targeted by this assay, and inadequate number of viral ?copies(<138 copies/mL). A negative result must be combined with ?clinical observations, patient history, and epidemiological ?information.  The expected result is Negative. ? ?Fact Sheet for Patients:  ?EntrepreneurPulse.com.au ? ?Fact Sheet for Healthcare Providers:  ?IncredibleEmployment.be ? ?This test is no t yet approved or cleared by the [place] FDA and  ?has been authorized for detection and/or diagnosis of SARS-CoV-2 by ?FDA under an Emergency Use Authorization (EUA). This EUA will remain  ?in effect (meaning this test can be used) for the duration of the ?COVID-19 declaration under Section 564(b)(1) of the Act, 21 ?U.S.C.section 360bbb-3(b)(1), unless the authorization is terminated  ?or revoked sooner.  ? ? ?  ? Influenza A by PCR NEGATIVE NEGATIVE Final  ? Influenza B by PCR NEGATIVE NEGATIVE Final  ?  Comment: (NOTE) ?The Xpert Xpress  SARS-CoV-2/FLU/RSV plus assay is intended as an aid ?in the diagnosis of influenza from Nasopharyngeal swab specimens and ?should not be used as a sole basis for treatment. Nasal washings and ?aspirates are unacceptable for Xpert Xpress

## 2022-03-24 NOTE — Progress Notes (Signed)
Stated tessalon perles did not help with cough and cymbalta didn't help with pain.  ?

## 2022-03-25 DIAGNOSIS — J9801 Acute bronchospasm: Secondary | ICD-10-CM | POA: Diagnosis not present

## 2022-03-25 LAB — BASIC METABOLIC PANEL WITH GFR
Anion gap: 9 (ref 5–15)
BUN: 26 mg/dL — ABNORMAL HIGH (ref 8–23)
CO2: 30 mmol/L (ref 22–32)
Calcium: 9 mg/dL (ref 8.9–10.3)
Chloride: 100 mmol/L (ref 98–111)
Creatinine, Ser: 0.76 mg/dL (ref 0.44–1.00)
GFR, Estimated: >60 mL/min
Glucose, Bld: 188 mg/dL — ABNORMAL HIGH (ref 70–99)
Potassium: 5.2 mmol/L — ABNORMAL HIGH (ref 3.5–5.1)
Sodium: 139 mmol/L (ref 135–145)

## 2022-03-25 LAB — CBC
HCT: 31.7 % — ABNORMAL LOW (ref 36.0–46.0)
Hemoglobin: 10.6 g/dL — ABNORMAL LOW (ref 12.0–15.0)
MCH: 33.2 pg (ref 26.0–34.0)
MCHC: 33.4 g/dL (ref 30.0–36.0)
MCV: 99.4 fL (ref 80.0–100.0)
Platelets: 218 10*3/uL (ref 150–400)
RBC: 3.19 MIL/uL — ABNORMAL LOW (ref 3.87–5.11)
RDW: 14.8 % (ref 11.5–15.5)
WBC: 7.1 10*3/uL (ref 4.0–10.5)
nRBC: 0 % (ref 0.0–0.2)

## 2022-03-25 MED ORDER — AZITHROMYCIN 250 MG PO TABS: ORAL_TABLET | ORAL | 0 refills | Status: DC

## 2022-03-25 MED ORDER — AEROCHAMBER MV MISC: 0 refills | Status: DC

## 2022-03-25 MED ORDER — LEVALBUTEROL TARTRATE 45 MCG/ACT IN AERO: 1.0000 | INHALATION_SPRAY | RESPIRATORY_TRACT | 2 refills | Status: DC | PRN

## 2022-03-25 MED ORDER — POLYVINYL ALCOHOL 1.4 % OP SOLN
1.0000 [drp] | OPHTHALMIC | Status: DC | PRN
  Administered 2022-03-25: 1 [drp] via OPHTHALMIC
  Filled 2022-03-25: qty 15

## 2022-03-25 MED ORDER — METHYLPREDNISOLONE 4 MG PO TBPK: ORAL_TABLET | ORAL | 0 refills | Status: DC

## 2022-03-25 NOTE — Plan of Care (Signed)

## 2022-03-25 NOTE — Care Management Important Message (Signed)
Important Message ? ?Patient Details  ?Name: Stacey [name] ?MRN: [id number] ?Date of Birth: 1946-07-03 ? ? ?Medicare Important Message Given:  Yes ? ? ? ? ?Tommy Medal ?03/25/2022, 12:38 PM ?

## 2022-03-25 NOTE — Discharge Summary (Signed)
Physician Discharge Summary  ?[name] NKN:[id number] DOB: February 10, 1946 DOA: 03/23/2022 ? ?PCP: Sela Hilding ? ?Admit date: 03/23/2022 ?Discharge date: 03/25/2022 ? ?Admitted From: Home ?Disposition: Home ? ?Recommendations for Outpatient Follow-up:  ?Follow up with PCP in 1-2 weeks ?Please follow-up with pulmonology as discussed ? ?Home Health: Home health PT ?Equipment/Devices: None ? ?Discharge Condition: Stable ?CODE STATUS: Full ?Diet recommendation: Low-salt low-fat diet as tolerated ? ?Brief/Interim Summary: ?Stacey [name] is a 76 y.o. female with medical history significant of hypertension, hyperlipidemia, CAD, breast cancer (follows with Dr. Delton Coombes) who presents to the emergency department due to several day onset of nonproductive cough and wheeze.  Patient indicates she has not left her house in "years" and blames her current symptoms on being outside during pollen season with multiple flowers blooming in her front yard.  Patient indicates she became hypoxic with coughing fit at home. Patient is bedbound at baseline due to her leg weakness and arthritis.  She denies fever, headache, blurry vision, nausea, vomiting, abdominal pain, diarrhea or constipation. ? ?Acute bronchospasm ?Cannot rule out COPD exacerbation(questionable diagnosis), POA ?Likely allergic in nature ?Continue supportive care, not hypoxic, continue steroid taper and inhalers at discharge ?Unfortunately patient has admitted to refusing further inhaler use, unclear how well she will continue to improve while refusing further nebulizer or inhaler medications, steroid taper ongoing ?Continue azithromycin to complete any antibiotic coverage for possible underlying infection although less likely ? ?Chronically elevated BNP Continue Cardiac diet ?Macrocytic anemia, chronic B12, folate levels within normal limits ?Essential hypertension (uncontrolled) Continue amlodipine, losartan and Toprol-XL ?Mixed hyperlipidemia Continue  Lipitor ?CAD Continue aspirin, Lipitor and Toprol-XL ?Malignant neoplasm of right breast  Patient follows with Dr. Delton Coombes ?Obesity (BMI 40.04 kg/m?) Diet and lifestyle modification discussion at bedside ?Chronic ambulatory dysfunction continue home health PT; unclear ambulatory dysfunction etiology, she was able to stand and pivot with nursing staff but states she "cannot walk, cannot bend her legs, cannot use a wheelchair, cannot use transport" but appears to be following with home health PT.  She states she does not leave the house for doctors office appointments and that they either televisit or she skips appointments.  We discussed ongoing need for close follow-up in the outpatient setting ? ?Discharge Diagnoses:  ?Principal Problem: ?  Acute bronchospasm ?Active Problems: ?  Essential hypertension ?  CAD (coronary artery disease) ?  Mixed hyperlipidemia ?  Obesity, Class III, BMI 40-49.9 (morbid obesity) ([place]) ?  Malignant neoplasm of right breast in female, estrogen receptor positive ([place]) ?  Elevated brain natriuretic peptide (BNP) level ?  Macrocytic anemia ?  Bronchospasm ? ? ? ?Discharge Instructions ? ?Discharge Instructions   ? ? Discharge patient   Complete by: As directed ?  ? Discharge disposition: 01-Home or Self Care  ? Discharge patient date: 03/25/2022  ? Face-to-face encounter (required for Medicare/Medicaid patients)   Complete by: As directed ?  ? I [place] certify that this patient is under my care and that I, or a nurse practitioner or physician's assistant working with me, had a face-to-face encounter that meets the physician face-to-face encounter requirements with this patient on 03/25/2022. The encounter with the patient was in whole, or in part for the following medical condition(s) which is the primary reason for home health care (List medical condition): Ambulatory dysfunction  ? The encounter with the patient was in whole, or in part, for the following medical condition,  which is the primary reason for home health care: ambulatory dysfunction  ?  I certify that, based on my findings, the following services are medically necessary home health services: Physical therapy  ? Reason for Medically Necessary Home Health Services: Therapy- Personnel officer, Public librarian  ? My clinical findings support the need for the above services: Unable to leave home safely without assistance and/or assistive device  ? Further, I certify that my clinical findings support that this patient is homebound due to: Unable to leave home safely without assistance  ? Home Health   Complete by: As directed ?  ? To provide the following care/treatments:  PT ?Home Health Aide ?OT  ?  ? ?  ? ?Allergies as of 03/25/2022   ? ?   Reactions  ? Brilinta [ticagrelor] Diarrhea, Nausea Only  ? Nausea and severe diarrhea- general weakness. Patient says does not want to take again  ? Budesonide Palpitations  ? Albuterol Other (See Comments)  ? Heart racing  ? ?  ? ?  ?Medication List  ?  ? ?TAKE these medications   ? ?acetaminophen 325 MG tablet ?Commonly known as: TYLENOL ?Take 2 tablets (650 mg total) by mouth every 6 (six) hours as needed for mild pain, fever or headache. ?  ?AeroChamber MV inhaler ?Use as instructed ?  ?amLODipine 5 MG tablet ?Commonly known as: NORVASC ?TAKE 1 TABLET (5 MG TOTAL) BY MOUTH DAILY ?  ?aspirin EC 81 MG tablet ?Take 1 tablet (81 mg total) by mouth daily with breakfast. Swallow whole. ?  ?atorvastatin 80 MG tablet ?Commonly known as: LIPITOR ?TAKE 1 TABLET EVERY MORNING ?  ?azithromycin 250 MG tablet ?Commonly known as: ZITHROMAX ?Take 1 tab daily until completed ?Start taking on: March 26, 2022 ?  ?b complex vitamins tablet ?Take 1 tablet by mouth every morning. ?  ?carboxymethylcellulose 0.5 % Soln ?Commonly known as: REFRESH PLUS ?Place 1 drop into both eyes in the morning, at noon, in the evening, and at bedtime. ?  ?clopidogrel 75 MG tablet ?Commonly known as:  PLAVIX ?TAKE 1 TABLET EVERY DAY ?  ?DULoxetine 30 MG capsule ?Commonly known as: CYMBALTA ?Take 3 capsules (90 mg total) by mouth daily. ?What changed:  ?how much to take ?when to take this ?  ?EQ VISION FORMULA 50+ PO ?Take 1 tablet by mouth every morning. ?  ?levalbuterol 45 MCG/ACT inhaler ?Commonly known as: XOPENEX HFA ?Inhale 1 puff into the lungs every 4 (four) hours as needed for wheezing. ?  ?losartan 100 MG tablet ?Commonly known as: COZAAR ?Take 1 tablet (100 mg total) by mouth daily. ?What changed: how much to take ?  ?methylPREDNISolone 4 MG Tbpk tablet ?Commonly known as: MEDROL DOSEPAK ?Taper per package directions ?  ?metoprolol succinate 100 MG 24 hr tablet ?Commonly known as: TOPROL-XL ?TAKE 1 AND 1/2 TABLETS DAILY (DOSE INCREASE) ?  ?MULTIVITAMIN ADULT PO ?Take 1 tablet by mouth daily. ?  ?nitroGLYCERIN 0.4 MG SL tablet ?Commonly known as: Nitrostat ?Place 1 tablet (0.4 mg total) under the tongue every 5 (five) minutes x 3 doses as needed for chest pain (if no relief after 3rd dose, proceed to the ED or call 911). ?  ?PAIN MANAGEMENT INTRATHECAL (IT) PUMP ?by Intrathecal route. ?  ?pregabalin 50 MG capsule ?Commonly known as: LYRICA ?Take 50 mg by mouth 3 (three) times daily. ?  ? ?  ? ? ?Allergies  ?Allergen Reactions  ? Brilinta [Ticagrelor] Diarrhea and Nausea Only  ?  Nausea and severe diarrhea- general weakness. Patient says does not want to take again  ?  Budesonide Palpitations  ? Albuterol Other (See Comments)  ?  Heart racing ?  ? ? ?Consultations: ?None ? ? ?Procedures/Studies: ?CT ABDOMEN PELVIS WO CONTRAST ? ?Result Date: 03/13/2022 ?CLINICAL DATA:  History of breast carcinoma and right-sided abdominal pain EXAM: CT ABDOMEN AND PELVIS WITHOUT CONTRAST TECHNIQUE: Multidetector CT imaging of the abdomen and pelvis was performed following the standard protocol without IV contrast. RADIATION DOSE REDUCTION: This exam was performed according to the departmental dose-optimization program which  includes automated exposure control, adjustment of the mA and/or kV according to patient size and/or use of iterative reconstruction technique. COMPARISON:  12/13/2021 FINDINGS: Lower chest: No focal infiltrate or

## 2022-03-25 NOTE — TOC Transition Note (Signed)
Transition of Care (TOC) - CM/SW Discharge Note ? ? ?Patient Details  ?Name: [name] ?MRN: [id number] ?Date of Birth: Dec 11, 1945 ? ?Transition of Care (TOC) CM/SW Contact:  ?Jaycie Kregel D, LCSW ?Phone Number: ?03/25/2022, 1:13 PM ? ? ?Clinical Narrative:    ?Vaughan Basta with [facility] notified of patient's d/c and HH orders are in.  ? ?  ?Barriers to Discharge: Continued Medical Work up ? ? ?Patient Goals and CMS Choice ?Patient states their goals for this hospitalization and ongoing recovery are:: Home with HH ?CMS Medicare.gov Compare Post Acute Care list provided to:: Patient ?Choice offered to / list presented to : Patient ? ?Discharge Placement ?  ?           ?  ?  ?  ?  ? ?Discharge Plan and Services ?In-house Referral: Clinical Social Work ?  ?Post Acute Care Choice: Home Health          ?  ?  ?  ?  ?  ?HH Arranged: PT, OT, Nurse's Aide ?[place] Agency: [place] ([place]) ?  ?  ?  ? ?Social Determinants of Health (SDOH) Interventions ?  ? ? ?Readmission Risk Interventions ? ?  07/03/2020  ?  1:33 PM 04/26/2020  ?  4:24 PM  ?Readmission Risk Prevention Plan  ?Transportation Screening Complete Complete  ?[place] or Home Care Consult  Complete  ?Social Work Consult for [place] Planning/Counseling  Complete  ?Palliative Care Screening  Complete  ?Medication Review Press photographer) Complete Complete  ?PCP or Specialist appointment within 3-5 days of discharge Complete   ?[place] or Home Care Consult Complete   ?SW Recovery Care/Counseling Consult Complete   ?Palliative Care Screening Complete   ?[place] Not Applicable   ? ? ? ? ? ?

## 2022-04-15 ENCOUNTER — Inpatient Hospital Stay (HOSPITAL_COMMUNITY): Payer: Medicare Other

## 2022-04-15 ENCOUNTER — Inpatient Hospital Stay (HOSPITAL_COMMUNITY): Payer: Medicare Other | Attending: Hematology

## 2022-04-15 VITALS — BP 154/71 | HR 85 | Temp 98.9°F | Resp 20

## 2022-04-15 DIAGNOSIS — C7951 Secondary malignant neoplasm of bone: Secondary | ICD-10-CM | POA: Insufficient documentation

## 2022-04-15 DIAGNOSIS — Z5111 Encounter for antineoplastic chemotherapy: Secondary | ICD-10-CM | POA: Insufficient documentation

## 2022-04-15 DIAGNOSIS — C50411 Malignant neoplasm of upper-outer quadrant of right female breast: Secondary | ICD-10-CM

## 2022-04-15 DIAGNOSIS — C50911 Malignant neoplasm of unspecified site of right female breast: Secondary | ICD-10-CM | POA: Diagnosis present

## 2022-04-15 DIAGNOSIS — Z170 Estrogen receptor positive status [ER+]: Secondary | ICD-10-CM | POA: Diagnosis not present

## 2022-04-15 LAB — COMPREHENSIVE METABOLIC PANEL
ALT: 13 U/L (ref 0–44)
AST: 18 U/L (ref 15–41)
Albumin: 3.3 g/dL — ABNORMAL LOW (ref 3.5–5.0)
Alkaline Phosphatase: 41 U/L (ref 38–126)
Anion gap: 5 (ref 5–15)
BUN: 20 mg/dL (ref 8–23)
CO2: 30 mmol/L (ref 22–32)
Calcium: 9.2 mg/dL (ref 8.9–10.3)
Chloride: 107 mmol/L (ref 98–111)
Creatinine, Ser: 0.67 mg/dL (ref 0.44–1.00)
GFR, Estimated: >60 mL/min (ref >60)
Glucose, Bld: 106 mg/dL — ABNORMAL HIGH (ref 70–99)
Potassium: 4.9 mmol/L (ref 3.5–5.1)
Sodium: 142 mmol/L (ref 135–145)
Total Bilirubin: 0.8 mg/dL (ref 0.3–1.2)
Total Protein: 6.1 g/dL — ABNORMAL LOW (ref 6.5–8.1)

## 2022-04-15 MED ORDER — FULVESTRANT 250 MG/5ML IM SOSY
500.0000 mg | PREFILLED_SYRINGE | Freq: Once | INTRAMUSCULAR | Status: AC
  Administered 2022-04-15: 500 mg via INTRAMUSCULAR
  Filled 2022-04-15: qty 10

## 2022-04-15 MED ORDER — DENOSUMAB 120 MG/1.7ML ~~LOC~~ SOLN
120.0000 mg | Freq: Once | SUBCUTANEOUS | Status: AC
  Administered 2022-04-15: 120 mg via SUBCUTANEOUS
  Filled 2022-04-15: qty 1.7

## 2022-04-15 NOTE — Patient Instructions (Addendum)
[facility]  Discharge Instructions: ?Thank you for choosing [facility] to provide your oncology and hematology care.  ?If you have a lab appointment with the [facility], please come in thru the Main Entrance and check in at the main information desk. ? ?Wear comfortable clothing and clothing appropriate for easy access to any Portacath or PICC line.  ? ?We strive to give you quality time with your provider. You may need to reschedule your appointment if you arrive late (15 or more minutes).  Arriving late affects you and other patients whose appointments are after yours.  Also, if you miss three or more appointments without notifying the office, you may be dismissed from the clinic at the provider?s discretion.    ?  ?For prescription refill requests, have your pharmacy contact our office and allow 72 hours for refills to be completed.   ? ?Today you received the following chemotherapy and/or immunotherapy agents Faslodex and Xgeva ?  ?To help prevent nausea and vomiting after your treatment, we encourage you to take your nausea medication as directed. ? ?BELOW ARE SYMPTOMS THAT SHOULD BE REPORTED IMMEDIATELY: ?*FEVER GREATER THAN 100.4 F (38 ?C) OR HIGHER ?*CHILLS OR SWEATING ?*NAUSEA AND VOMITING THAT IS NOT CONTROLLED WITH YOUR NAUSEA MEDICATION ?*UNUSUAL SHORTNESS OF BREATH ?*UNUSUAL BRUISING OR BLEEDING ?*URINARY PROBLEMS (pain or burning when urinating, or frequent urination) ?*BOWEL PROBLEMS (unusual diarrhea, constipation, pain near the anus) ?TENDERNESS IN MOUTH AND THROAT WITH OR WITHOUT PRESENCE OF ULCERS (sore throat, sores in mouth, or a toothache) ?UNUSUAL RASH, SWELLING OR PAIN  ?UNUSUAL VAGINAL DISCHARGE OR ITCHING  ? ?Items with * indicate a potential emergency and should be followed up as soon as possible or go to the Emergency Department if any problems should occur. ? ?Please show the CHEMOTHERAPY ALERT CARD or IMMUNOTHERAPY ALERT CARD at check-in to the Emergency  Department and triage nurse. ? ?Should you have questions after your visit or need to cancel or reschedule your appointment, please contact [facility] [phone]  and follow the prompts.  Office hours are 8:00 a.m. to 4:30 p.m. Monday - Friday. Please note that voicemails left after 4:00 p.m. may not be returned until the following business day.  We are closed weekends and major holidays. You have access to a nurse at all times for urgent questions. Please call the main number to the clinic [phone] and follow the prompts. ? ?For any non-urgent questions, you may also contact your provider using MyChart. We now offer e-Visits for anyone 65 and older to request care online for non-urgent symptoms. For details visit [email or web address]. ?  ?Also download the MyChart app! Go to the app store, search "MyChart", open the app, select [facility], and log in with your MyChart username and password. ? ?Due to Covid, a mask is required upon entering the hospital/clinic. If you do not have a mask, one will be given to you upon arrival. For doctor visits, patients may have 1 support person aged 36 or older with them. For treatment visits, patients cannot have anyone with them due to current Covid guidelines and our immunocompromised population.  ?

## 2022-04-15 NOTE — Progress Notes (Signed)
Patient presents today for Faslodex and Xgeva per providers order.  Vital signs WNL.  Labs pending.  Patient has no new complaints at this time. ? ?Patient states that she has been taking her Calcium and Vitamin D supplements, has had no major dental work, or jaw pain. ? ?Faslodex and Xgeva administration without incident; injection site WNL; see MAR for injection details.  Patient tolerated procedure well and without incident.  No questions or complaints noted at this time. Discharge from clinic via [place] in stable condition.  Alert and oriented X 3.  Follow up with [facility] as scheduled.  ?

## 2022-04-18 ENCOUNTER — Telehealth: Payer: Self-pay | Admitting: Cardiology

## 2022-04-18 MED ORDER — ATORVASTATIN CALCIUM 80 MG PO TABS: 80.0000 mg | ORAL_TABLET | Freq: Every day | ORAL | 0 refills | Status: DC

## 2022-04-18 NOTE — Telephone Encounter (Signed)
*  STAT* If patient is at the pharmacy, call can be transferred to refill team.   1. Which medications need to be refilled? (please list name of each medication and dose if known)   atorvastatin (LIPITOR) 80 MG tablet    2. Which pharmacy/location (including street and city if local pharmacy) is medication to be sent to?  [place], [place] [address]. Do they need a 30 day or 90 day supply?  10 day - Pt states that she is out of medication and would like enough supply sent to this pharmacy until her mail order pharmacy delivery comes.  Pt also states that she is due for a 6 month f/u but is not mobile and would like to know if there is another alternate that she can do such as a telephone appt . Please advise

## 2022-04-18 NOTE — Telephone Encounter (Signed)
Lipitor refilled for 10 tabs as requested - sent to [facility] now.   6 mo phone visit scheduled for 07/01/2022 with Dr. Harl Bowie - also put on cx list as well.

## 2022-04-21 ENCOUNTER — Encounter: Payer: Self-pay | Admitting: Cardiology

## 2022-04-21 ENCOUNTER — Ambulatory Visit (INDEPENDENT_AMBULATORY_CARE_PROVIDER_SITE_OTHER): Payer: Medicare Other | Admitting: Cardiology

## 2022-04-21 VITALS — BP 143/74 | HR 82 | Ht 60.0 in | Wt 205.0 lb

## 2022-04-21 DIAGNOSIS — I480 Paroxysmal atrial fibrillation: Secondary | ICD-10-CM | POA: Diagnosis not present

## 2022-04-21 DIAGNOSIS — I5022 Chronic systolic (congestive) heart failure: Secondary | ICD-10-CM

## 2022-04-21 DIAGNOSIS — I10 Essential (primary) hypertension: Secondary | ICD-10-CM | POA: Diagnosis not present

## 2022-04-21 DIAGNOSIS — I2510 Atherosclerotic heart disease of native coronary artery without angina pectoris: Secondary | ICD-10-CM | POA: Diagnosis not present

## 2022-04-21 NOTE — Progress Notes (Signed)
Virtual Visit via Telephone Note   Because of [name]'s co-morbid illnesses, she is at least at moderate risk for complications without adequate follow up.  This format is felt to be most appropriate for this patient at this time.  The patient did not have access to video technology/had technical difficulties with video requiring transitioning to audio format only (telephone).  All issues noted in this document were discussed and addressed.  No physical exam could be performed with this format.  Please refer to the patient's chart for her consent to telehealth for [facility].    Date:  04/21/2022   ID:  Stacey [name], DOB May 09, 1946, MRN [id number] The patient was identified using 2 identifiers.  Patient Location: Home Provider Location: Office/Clinic   PCP:  [facility] Providers Cardiologist:  Carlyle Dolly, MD     Evaluation Performed:  Follow-Up Visit  Chief Complaint:  Follow up visit  History of Present Illness:    Stacey [name] is a 76 y.o. female seen today for follow up of the following medical problems.      1. Chronic systolic HF/ICM - Jan 7893 echo LVEF 20-25%, restrictive diastolic function, mild AS, PASP 40 - 10/2017 echo LVEF 50%, dropped to 30-35% Jan 2019 and has remained decreased   - chronic left leg edema thought to be venous insufficiency   From prior cardiologist: "She is not a candidate for device therapy given her metastatic cancer and numerous comorbidities and poor long-term prognosis. She has been scheduled to establish care with the palliative care team which I think is quite appropriate".  - has been hesitant to change to entresto due to concern about cost      03/2020 LVEF 35-40%   03/2021 echo LVEF 50-55%, grade I dd - no recent SOB/DOE since discharge for bronchospasm - no recent edema - compliant with meds    2. CAD - notes incidate history of DES to ramus, late stent thrombosis requiring  PTCA in 2006. DES to RCA in Dec 2018 - SOB on brillinta, previously changed to plavix.    -denies any recent chest pains.        3. Mild aortic stenosis - Jan 2020 echo mean grad 11, AVA VTI 1.69 - 2021 mild to mod AS   03/2021 echo mild moderate AS: mean grad 10.5, DI 0.31, AVA VTI 1.09   4. Chronic LBBB     5. . Metastatic breast cancer/Chronic pain - followed at [facility]. Metastatic breast cancer to the bone and renal lesion, has pulmonary nodules. On radiation. - has been seen by palliative care previously - from 03/08/20 onc note increased size of renal lesion, to f/u with urology. Mention concern for additional primary such as renal cell CA.      6. Afib - off coumadin for intrathecal pain pump through palliative pain management at [facility]   - isolated episode of palpitations - compliant with meds        7. Recent lap chole 03/2021 - later required incarcerated umbilical hernia repair 07/1016     8. HTN - recent issues with high bp at home - home sbp 130s/60s      Past Medical History:  Diagnosis Date   CAD in native artery    a. DES to ramus 2005 with late stent thrombosis 2006 tx with PTCA. 12/18 PCI/DES x1 to mRCA, EF 50-55%   CHF (congestive heart failure) (HCC)    Chronic pain  COPD (chronic obstructive pulmonary disease) (HCC)    Hyperlipidemia    Hypertension    Left bundle [name] block    Lymphedema    Metastatic breast cancer    a. to bone.   MI (myocardial infarction) ([place])    Mild aortic stenosis 10/2017   Morbid obesity ([place])    PAF (paroxysmal atrial fibrillation) ([place])    PSVT (paroxysmal supraventricular tachycardia) ([place])    a. per [facility] notes, seen on event monitor in 2014.   Pulmonary nodules    Past Surgical History:  Procedure Laterality Date   CHOLECYSTECTOMY N/A 03/29/2021   Procedure: LAPAROSCOPIC CHOLECYSTECTOMY;  Surgeon: [name], MD;  Location: [facility];  Service: General;  Laterality: N/A;   CORONARY STENT INTERVENTION  N/A 11/26/2017   Procedure: CORONARY STENT INTERVENTION;  Surgeon: Martinique, Peter M, MD;  Location: [place] CV [facility];  Service: Cardiovascular;  Laterality: N/A;   CORONARY STENT PLACEMENT     ERCP N/A 03/28/2021   Procedure: ENDOSCOPIC RETROGRADE CHOLANGIOPANCREATOGRAPHY (ERCP);  Surgeon: Rogene Houston, MD;  Location: [facility];  Service: Endoscopy;  Laterality: N/A;   intrathecal pain pump     LEFT HEART CATH AND CORONARY ANGIOGRAPHY N/A 11/26/2017   Procedure: LEFT HEART CATH AND CORONARY ANGIOGRAPHY;  Surgeon: Martinique, Peter M, MD;  Location: [place] CV LAB;  Service: Cardiovascular;  Laterality: N/A;   REMOVAL OF STONES  03/28/2021   Procedure: REMOVAL OF STONES;  Surgeon: Rogene Houston, MD;  Location: [facility];  Service: Endoscopy;;   SPHINCTEROTOMY  03/28/2021   Procedure: SPHINCTEROTOMY;  Surgeon: Rogene Houston, MD;  Location: [facility];  Service: Endoscopy;;   UMBILICAL HERNIA REPAIR N/A 04/03/2021   Procedure: ADULT PRIMARY UMBILICAL HERNIA REPAIR;  Surgeon: [name], MD;  Location: [facility];  Service: General;  Laterality: N/A;   VASCULAR SURGERY       No outpatient medications have been marked as taking for the 04/21/22 encounter (Appointment) with Arnoldo Lenis, MD.     Allergies:   Brilinta [ticagrelor], Budesonide, and Albuterol   Social History   Tobacco Use   Smoking status: Former    Packs/day: 2.00    Years: 18.00    Pack years: 36.00    Types: Cigarettes    Quit date: 12/01/1980    Years since quitting: 41.4   Smokeless tobacco: Never  Vaping Use   Vaping Use: Never used  Substance Use Topics   Alcohol use: No   Drug use: Yes    Types: Oxycodone, Morphine    Comment: takes methadone     Family Hx: The patient's family history includes CAD in her paternal grandmother; CAD (age of onset: 74) in her father; COPD in her sister; Heart attack in her father. There is no history of Sudden Cardiac Death, Colon polyps, or Colon cancer.  ROS:   Please  see the history of present illness.     All other systems reviewed and are negative.   Prior CV studies:   The following studies were reviewed today:  Labs/Other Tests and Data Reviewed:    EKG:  No ECG reviewed.  Recent Labs: 05/01/2021: TSH 1.425 03/23/2022: B Natriuretic Peptide 116.0 03/24/2022: Magnesium 2.2 03/25/2022: Hemoglobin 10.6; Platelets 218 04/15/2022: ALT 13; BUN 20; Creatinine, Ser 0.67; Potassium 4.9; Sodium 142   Recent Lipid Panel No results found for: CHOL, TRIG, HDL, CHOLHDL, LDLCALC, LDLDIRECT  Wt Readings from Last 3 Encounters:  03/25/22 192 lb 0.3 oz (87.1 kg)  03/12/22  205 lb (93 kg)  10/23/21 205 lb (93 kg)     Risk Assessment/Calculations:          Objective:    Vital Signs:   Today's Vitals   04/21/22 0818  BP: (!) 143/74  Pulse: 82  SpO2: 97%  Weight: 205 lb (93 kg)  Height: 5' (1.524 m)   Body mass index is 40.04 kg/m. Normal affect. Normal speech pattern and tone. Comfortable, no apparent distress. No audible signs of sob or wheezing.   ASSESSMENT & PLAN:    1. Chronic systolic HF - LVEF has normalized - doing well without symptoms, continue current meds   2. CAD - on extended DAPT due to history of late stent thrombosis - no symptoms, continue current meds   3. PAF -off coumadin since intrathecal pain pump placement at [facility], contraindicated with pump in place - continue current meds. Overlall doing well   4. HTN - home bp's are overall at goal               Time:   Today, I have spent 22 minutes with the patient with telehealth technology discussing the above problems.     Medication Adjustments/Labs and Tests Ordered: Current medicines are reviewed at length with the patient today.  Concerns regarding medicines are outlined above.   Tests Ordered: No orders of the defined types were placed in this encounter.   Medication Changes: No orders of the defined types were placed in this encounter.   Follow Up:   in person or virtual  6 months. Limited mobility due to chronic pain   Signed, Carlyle Dolly, MD  04/21/2022 7:52 AM    [facility]

## 2022-04-22 NOTE — Patient Instructions (Signed)
Medication Instructions:  Continue all current medications.   Labwork: none  Testing/Procedures: none  Follow-Up: 6 months   Any Other Special Instructions Will Be Listed Below (If Applicable).   If you need a refill on your cardiac medications before your next appointment, please call your pharmacy.  

## 2022-05-09 ENCOUNTER — Other Ambulatory Visit: Payer: Self-pay

## 2022-05-09 ENCOUNTER — Encounter (HOSPITAL_COMMUNITY): Payer: Self-pay | Admitting: *Deleted

## 2022-05-09 ENCOUNTER — Emergency Department (HOSPITAL_COMMUNITY): Payer: Medicare Other

## 2022-05-09 ENCOUNTER — Emergency Department (HOSPITAL_COMMUNITY)
Admission: EM | Admit: 2022-05-09 | Discharge: 2022-05-10 | Disposition: A | Discharge status: Home / Self Care | Payer: Medicare Other | Attending: Emergency Medicine | Admitting: Emergency Medicine

## 2022-05-09 DIAGNOSIS — Z7902 Long term (current) use of antithrombotics/antiplatelets: Secondary | ICD-10-CM | POA: Insufficient documentation

## 2022-05-09 DIAGNOSIS — Z7982 Long term (current) use of aspirin: Secondary | ICD-10-CM | POA: Diagnosis not present

## 2022-05-09 DIAGNOSIS — M545 Low back pain, unspecified: Secondary | ICD-10-CM | POA: Diagnosis present

## 2022-05-09 DIAGNOSIS — M48061 Spinal stenosis, lumbar region without neurogenic claudication: Secondary | ICD-10-CM

## 2022-05-09 DIAGNOSIS — M5441 Lumbago with sciatica, right side: Secondary | ICD-10-CM | POA: Diagnosis not present

## 2022-05-09 DIAGNOSIS — G8929 Other chronic pain: Secondary | ICD-10-CM | POA: Diagnosis not present

## 2022-05-09 LAB — BASIC METABOLIC PANEL
Anion gap: 6 (ref 5–15)
BUN: 16 mg/dL (ref 8–23)
CO2: 30 mmol/L (ref 22–32)
Calcium: 9 mg/dL (ref 8.9–10.3)
Chloride: 104 mmol/L (ref 98–111)
Creatinine, Ser: 0.64 mg/dL (ref 0.44–1.00)
GFR, Estimated: >60 mL/min (ref >60)
Glucose, Bld: 98 mg/dL (ref 70–99)
Potassium: 3.8 mmol/L (ref 3.5–5.1)
Sodium: 140 mmol/L (ref 135–145)

## 2022-05-09 LAB — CBC WITH DIFFERENTIAL/PLATELET
Abs Immature Granulocytes: 0.01 10*3/uL (ref 0.00–0.07)
Basophils Absolute: 0 10*3/uL (ref 0.0–0.1)
Basophils Relative: 1 %
Eosinophils Absolute: 0.1 10*3/uL (ref 0.0–0.5)
Eosinophils Relative: 3 %
HCT: 34.7 % — ABNORMAL LOW (ref 36.0–46.0)
Hemoglobin: 11.2 g/dL — ABNORMAL LOW (ref 12.0–15.0)
Immature Granulocytes: 0 %
Lymphocytes Relative: 34 %
Lymphs Abs: 1.4 10*3/uL (ref 0.7–4.0)
MCH: 33 pg (ref 26.0–34.0)
MCHC: 32.3 g/dL (ref 30.0–36.0)
MCV: 102.4 fL — ABNORMAL HIGH (ref 80.0–100.0)
Monocytes Absolute: 0.4 10*3/uL (ref 0.1–1.0)
Monocytes Relative: 10 %
Neutro Abs: 2.1 10*3/uL (ref 1.7–7.7)
Neutrophils Relative %: 52 %
Platelets: 202 10*3/uL (ref 150–400)
RBC: 3.39 MIL/uL — ABNORMAL LOW (ref 3.87–5.11)
RDW: 14.3 % (ref 11.5–15.5)
WBC: 4 10*3/uL (ref 4.0–10.5)
nRBC: 0 % (ref 0.0–0.2)

## 2022-05-09 LAB — BRAIN NATRIURETIC PEPTIDE: B Natriuretic Peptide: 77 pg/mL (ref 0.0–100.0)

## 2022-05-09 MED ORDER — GADOBUTROL 1 MMOL/ML IV SOLN
10.0000 mL | Freq: Once | INTRAVENOUS | Status: AC | PRN
  Administered 2022-05-09: 10 mL via INTRAVENOUS

## 2022-05-09 MED ORDER — HYDROMORPHONE HCL 1 MG/ML IJ SOLN
1.0000 mg | Freq: Once | INTRAMUSCULAR | Status: AC
  Administered 2022-05-09: 1 mg via INTRAVENOUS
  Filled 2022-05-09: qty 1

## 2022-05-09 NOTE — ED Triage Notes (Signed)
Pt brought in by [facility] from home with c/o worsening back pain over the last week. EMS reports pt is normally able to sit up in the bed but due to the pain, pt has been unable to sit up.

## 2022-05-09 NOTE — ED Provider Notes (Signed)
[facility] EMERGENCY DEPARTMENT Provider Note   CSN: [id number] Arrival date & time: 05/09/22  2637     History  Chief Complaint  Patient presents with   Back Pain    BRITTENEY [name] is a 76 y.o. female.  Patient presents with acute exacerbation of chronic back pain.  Describes right-sided chronic back pain rating down the right leg she has had this for several years.  She states that she saw spine specialist at [facility] but has now moved to [place].  She denies any new numbness or weakness describes sharp pain in the right side rating down the right leg.  No fever no cough no vomiting no diarrhea.  She states the pain has become more severe is not difficult for her to transition from wheelchair to bed.       Home Medications Prior to Admission medications   Medication Sig Start Date End Date Taking? Authorizing Provider  acetaminophen (TYLENOL) 325 MG tablet Take 2 tablets (650 mg total) by mouth every 6 (six) hours as needed for mild pain, fever or headache. 04/05/21   Roxan Hockey, MD  amLODipine (NORVASC) 5 MG tablet TAKE 1 TABLET (5 MG TOTAL) BY MOUTH DAILY 01/27/22   Arnoldo Lenis, MD  aspirin EC 81 MG tablet Take 1 tablet (81 mg total) by mouth daily with breakfast. Swallow whole. 04/07/21   Roxan Hockey, MD  atorvastatin (LIPITOR) 80 MG tablet Take 1 tablet (80 mg total) by mouth daily. 04/18/22   Arnoldo Lenis, MD  b complex vitamins tablet Take 1 tablet by mouth every morning. 07/05/20   Florencia Reasons, MD  carboxymethylcellulose (REFRESH PLUS) 0.5 % SOLN Place 1 drop into both eyes in the morning, at noon, in the evening, and at bedtime.    [provider]  clopidogrel (PLAVIX) 75 MG tablet TAKE 1 TABLET EVERY DAY 07/03/21   Arnoldo Lenis, MD  DULoxetine (CYMBALTA) 30 MG capsule Take 3 capsules (90 mg total) by mouth daily. Patient taking differently: Take 30 mg by mouth 2 (two) times daily. 07/06/20   Florencia Reasons, MD  levalbuterol [facility] HFA) 45 MCG/ACT  inhaler Inhale 1 puff into the lungs every 4 (four) hours as needed for wheezing. 03/25/22 03/25/23  Little Ishikawa, MD  losartan (COZAAR) 100 MG tablet Take 1 tablet (100 mg total) by mouth daily. Patient taking differently: Take 50 mg by mouth daily. 07/11/21   Arnoldo Lenis, MD  metoprolol succinate (TOPROL-XL) 100 MG 24 hr tablet TAKE 1 AND 1/2 TABLETS DAILY (DOSE INCREASE) 06/19/21   Arnoldo Lenis, MD  Multiple Vitamin (MULTIVITAMIN ADULT PO) Take 1 tablet by mouth daily.    [provider]  Multiple Vitamins-Minerals (EQ VISION FORMULA 50+ PO) Take 1 tablet by mouth every morning.    [provider]  nitroGLYCERIN (NITROSTAT) 0.4 MG SL tablet Place 1 tablet (0.4 mg total) under the tongue every 5 (five) minutes x 3 doses as needed for chest pain (if no relief after 3rd dose, proceed to the ED or call 911). 07/11/21   Branch, Alphonse Guild, MD  PAIN MANAGEMENT INTRATHECAL, IT, PUMP by Intrathecal route. 02/21/21   [provider]  pregabalin (LYRICA) 50 MG capsule Take 50 mg by mouth 3 (three) times daily. 01/26/21   [provider]  Spacer/Aero-Holding Chambers (AEROCHAMBER MV) inhaler Use as instructed 03/25/22   Little Ishikawa, MD      Allergies    Brilinta [ticagrelor], Budesonide, and Albuterol  Review of Systems   Review of Systems  Constitutional:  Negative for fever.  HENT:  Negative for ear pain.   Eyes:  Negative for pain.  Respiratory:  Negative for cough.   Cardiovascular:  Negative for chest pain.  Gastrointestinal:  Negative for abdominal pain.  Genitourinary:  Negative for flank pain.  Musculoskeletal:  Positive for back pain.  Skin:  Negative for rash.  Neurological:  Negative for headaches.    Physical Exam Updated Vital Signs BP 139/62 (BP Location: Left Arm)   Pulse 86   Temp 98.1 F (36.7 C) (Oral)   Resp 19   Ht 5' (1.524 m)   Wt 93 kg   SpO2 94%   BMI 40.04 kg/m  Physical Exam Constitutional:       General: She is not in acute distress.    Appearance: Normal appearance.  HENT:     Head: Normocephalic.     Nose: Nose normal.  Eyes:     Extraocular Movements: Extraocular movements intact.  Cardiovascular:     Rate and Rhythm: Normal rate.  Pulmonary:     Effort: Pulmonary effort is normal.  Musculoskeletal:        General: Normal range of motion.     Cervical back: Normal range of motion.  Neurological:     General: No focal deficit present.     Mental Status: She is alert. Mental status is at baseline.     Comments: No saddle anesthesia noted.  5/5 strength all extremities.     ED Results / Procedures / Treatments   Labs (all labs ordered are listed, but only abnormal results are displayed) Labs Reviewed  CBC WITH DIFFERENTIAL/PLATELET - Abnormal; Notable for the following components:      Result Value   RBC 3.39 (*)    Hemoglobin 11.2 (*)    HCT 34.7 (*)    MCV 102.4 (*)    All other components within normal limits  BRAIN NATRIURETIC PEPTIDE  BASIC METABOLIC PANEL    EKG None  Radiology MR Lumbar Spine W Wo Contrast  Result Date: 05/09/2022 CLINICAL DATA:  Low back pain and history of breast carcinoma EXAM: MRI LUMBAR SPINE WITHOUT AND WITH CONTRAST TECHNIQUE: Multiplanar and multiecho pulse sequences of the lumbar spine were obtained without and with intravenous contrast. CONTRAST:  44m GADAVIST GADOBUTROL 1 MMOL/ML IV SOLN COMPARISON:  03/13/2022 FINDINGS: Segmentation:  Standard. Alignment:  Grade 1 anterolisthesis at L4-5 Vertebrae: Unchanged appearance of L1 compression fracture with large ossified dorsal component. Abnormal signal within the L2 vertebral body is also unchanged. No new fracture. Conus medullaris and cauda equina: Conus extends to the L1 level. Conus and cauda equina appear normal. Paraspinal and other soft tissues: Fatty atrophy of the paraspinous muscles. Disc levels: There is severe spinal canal stenosis at T12-L1 and L1-L2 with severe bilateral  neural foraminal stenosis at both levels secondary to the large ossified dorsal component of the fractured L1 vertebra. This compresses the proximal cauda equina, unchanged. L2-L3: Small disc bulge, unchanged. Right lateral recess narrowing without central spinal canal stenosis. Moderate mild neural foraminal stenosis. L3-L4: Small disc bulge mild facet hypertrophy. No spinal canal stenosis. Mild right and no left neural foraminal stenosis. L4-L5: Small disc bulge and moderate facet hypertrophy. No spinal canal stenosis. No right, but moderate left neural foraminal stenosis. L5-S1: Normal disc space and facet joints. No spinal canal stenosis. No neural foraminal stenosis. Visualized sacrum: Normal. IMPRESSION: 1. Unchanged appearance of L1 compression fracture with large ossified  dorsal component causing severe spinal canal stenosis and bilateral neural foraminal stenosis at the T12-L1 and L1-L2 levels with compression of the proximal cauda equina. 2. Unchanged multilevel degenerative disc disease with moderate left L4-5 neural foraminal stenosis. 3. Unchanged appearance of L2 metastasis Electronically Signed   By: Ulyses Jarred M.D.   On: 05/09/2022 19:39    Procedures Procedures    Medications Ordered in ED Medications  HYDROmorphone (DILAUDID) injection 1 mg (1 mg Intravenous Given 05/09/22 1819)  gadobutrol (GADAVIST) 1 MMOL/ML injection 10 mL (10 mLs Intravenous Contrast Given 05/09/22 1901)    ED Course/ Medical Decision Making/ A&P                           Medical Decision Making Amount and/or Complexity of Data Reviewed Labs: ordered. Radiology: ordered.  Risk Prescription drug management.   Chart review shows visit in April 2023 for similar symptoms.  MRI of the L-spine at that time showed severe spinal stenosis and vertebral body fracture.  Sinus studies includes labs which were unremarkable.  The patient states symptoms have worsened MRI of the L-spine pursued again.  Per  radiologist severe spine disease noted unchanged from prior imaging.  Patient pain is well controlled with Dilaudid here.  Recommending outpatient follow-up with her spine specialist within 1 to 2 weeks.  Advised immediate return for new numbness weakness or any additional concerns, otherwise discharged home in stable condition.        Final Clinical Impression(s) / ED Diagnoses Final diagnoses:  Chronic right-sided low back pain with right-sided sciatica    Rx / DC Orders ED Discharge Orders     None         Luna Fuse, MD 05/09/22 [phone]

## 2022-05-09 NOTE — Discharge Instructions (Addendum)
Your MRI shows significant lower back disease.  However this is very similar to prior MRI imaging obtained a few months ago and it appears stable at this time.  Call your spine specialist to arrange follow-up within the next 1 to 2 weeks.  Return immediately back to the ER if:  Your symptoms worsen within the next 12-24 hours. You develop new symptoms such as new fevers, persistent vomiting, new pain, shortness of breath, or new weakness or numbness, or if you have any other concerns.

## 2022-05-09 NOTE — ED Notes (Signed)
Not in waiting room x 1

## 2022-05-10 DIAGNOSIS — M5441 Lumbago with sciatica, right side: Secondary | ICD-10-CM | POA: Diagnosis not present

## 2022-05-10 MED ORDER — OXYCODONE-ACETAMINOPHEN 5-325 MG PO TABS
1.0000 | ORAL_TABLET | Freq: Once | ORAL | Status: AC
  Administered 2022-05-10: 1 via ORAL
  Filled 2022-05-10: qty 1

## 2022-05-13 ENCOUNTER — Inpatient Hospital Stay (HOSPITAL_COMMUNITY): Payer: Medicare Other

## 2022-05-13 ENCOUNTER — Inpatient Hospital Stay (HOSPITAL_COMMUNITY): Payer: Medicare Other | Attending: Hematology

## 2022-05-13 VITALS — BP 135/65 | HR 98 | Temp 98.8°F | Resp 18

## 2022-05-13 DIAGNOSIS — Z5111 Encounter for antineoplastic chemotherapy: Secondary | ICD-10-CM | POA: Insufficient documentation

## 2022-05-13 DIAGNOSIS — C7951 Secondary malignant neoplasm of bone: Secondary | ICD-10-CM | POA: Diagnosis present

## 2022-05-13 DIAGNOSIS — C50911 Malignant neoplasm of unspecified site of right female breast: Secondary | ICD-10-CM

## 2022-05-13 DIAGNOSIS — Z170 Estrogen receptor positive status [ER+]: Secondary | ICD-10-CM | POA: Diagnosis not present

## 2022-05-13 DIAGNOSIS — C50411 Malignant neoplasm of upper-outer quadrant of right female breast: Secondary | ICD-10-CM

## 2022-05-13 LAB — COMPREHENSIVE METABOLIC PANEL
ALT: 13 U/L (ref 0–44)
AST: 20 U/L (ref 15–41)
Albumin: 3.3 g/dL — ABNORMAL LOW (ref 3.5–5.0)
Alkaline Phosphatase: 37 U/L — ABNORMAL LOW (ref 38–126)
Anion gap: 3 — ABNORMAL LOW (ref 5–15)
BUN: 15 mg/dL (ref 8–23)
CO2: 32 mmol/L (ref 22–32)
Calcium: 9 mg/dL (ref 8.9–10.3)
Chloride: 107 mmol/L (ref 98–111)
Creatinine, Ser: 0.69 mg/dL (ref 0.44–1.00)
GFR, Estimated: >60 mL/min (ref >60)
Glucose, Bld: 143 mg/dL — ABNORMAL HIGH (ref 70–99)
Potassium: 5 mmol/L (ref 3.5–5.1)
Sodium: 142 mmol/L (ref 135–145)
Total Bilirubin: 0.8 mg/dL (ref 0.3–1.2)
Total Protein: 5.8 g/dL — ABNORMAL LOW (ref 6.5–8.1)

## 2022-05-13 MED ORDER — DENOSUMAB 120 MG/1.7ML ~~LOC~~ SOLN
120.0000 mg | Freq: Once | SUBCUTANEOUS | Status: AC
  Administered 2022-05-13: 120 mg via SUBCUTANEOUS
  Filled 2022-05-13: qty 1.7

## 2022-05-13 MED ORDER — FULVESTRANT 250 MG/5ML IM SOSY
500.0000 mg | PREFILLED_SYRINGE | Freq: Once | INTRAMUSCULAR | Status: AC
  Administered 2022-05-13: 500 mg via INTRAMUSCULAR
  Filled 2022-05-13: qty 10

## 2022-05-13 NOTE — Progress Notes (Signed)
Stacey [name] presents today for injection per the provider's orders. Faslodex and Xgeva  administration without incident; injection site WNL; see MAR for injection details.  Patient tolerated procedure well and without incident.  No questions or complaints noted at this time.   Discharged from clinic via [place] with [facility] in stable condition. Alert and oriented x 3. F/U with [facility] as scheduled.

## 2022-05-13 NOTE — Patient Instructions (Signed)
[facility]  Discharge Instructions: Thank you for choosing [facility] to provide your oncology and hematology care.  If you have a lab appointment with the Cancer Center, please come in thru the Main Entrance and check in at the main information desk.  Wear comfortable clothing and clothing appropriate for easy access to any Portacath or PICC line.   We strive to give you quality time with your provider. You may need to reschedule your appointment if you arrive late (15 or more minutes).  Arriving late affects you and other patients whose appointments are after yours.  Also, if you miss three or more appointments without notifying the office, you may be dismissed from the clinic at the provider's discretion.      For prescription refill requests, have your pharmacy contact our office and allow 72 hours for refills to be completed.    Today you received the following chemotherapy and/or immunotherapy agents Faslodex and Xgeva      To help prevent nausea and vomiting after your treatment, we encourage you to take your nausea medication as directed.  BELOW ARE SYMPTOMS THAT SHOULD BE REPORTED IMMEDIATELY: *FEVER GREATER THAN 100.4 F (38 C) OR HIGHER *CHILLS OR SWEATING *NAUSEA AND VOMITING THAT IS NOT CONTROLLED WITH YOUR NAUSEA MEDICATION *UNUSUAL SHORTNESS OF BREATH *UNUSUAL BRUISING OR BLEEDING *URINARY PROBLEMS (pain or burning when urinating, or frequent urination) *BOWEL PROBLEMS (unusual diarrhea, constipation, pain near the anus) TENDERNESS IN MOUTH AND THROAT WITH OR WITHOUT PRESENCE OF ULCERS (sore throat, sores in mouth, or a toothache) UNUSUAL RASH, SWELLING OR PAIN  UNUSUAL VAGINAL DISCHARGE OR ITCHING   Items with * indicate a potential emergency and should be followed up as soon as possible or go to the Emergency Department if any problems should occur.  Please show the CHEMOTHERAPY ALERT CARD or IMMUNOTHERAPY ALERT CARD at check-in to the  Emergency Department and triage nurse.  Should you have questions after your visit or need to cancel or reschedule your appointment, please contact [facility] [phone]  and follow the prompts.  Office hours are 8:00 a.m. to 4:30 p.m. Monday - Friday. Please note that voicemails left after 4:00 p.m. may not be returned until the following business day.  We are closed weekends and major holidays. You have access to a nurse at all times for urgent questions. Please call the main number to the clinic [phone] and follow the prompts.  For any non-urgent questions, you may also contact your provider using MyChart. We now offer e-Visits for anyone 18 and older to request care online for non-urgent symptoms. For details visit [email or web address].   Also download the MyChart app! Go to the app store, search "MyChart", open the app, select [facility], and log in with your MyChart username and password.  Masks are optional in the cancer centers. If you would like for your care team to wear a mask while they are taking care of you, please let them know. For doctor visits, patients may have with them one support person who is at least 75 years old. At this time, visitors are not allowed in the infusion area.  

## 2022-06-04 ENCOUNTER — Inpatient Hospital Stay (HOSPITAL_COMMUNITY): Payer: Medicare Other

## 2022-06-04 ENCOUNTER — Encounter (HOSPITAL_COMMUNITY)
Admission: RE | Admit: 2022-06-04 | Discharge: 2022-06-04 | Disposition: A | Discharge status: Home / Self Care | Payer: Medicare Other | Source: Ambulatory Visit | Attending: Hematology | Admitting: Hematology

## 2022-06-04 ENCOUNTER — Encounter (HOSPITAL_COMMUNITY): Payer: Self-pay

## 2022-06-04 DIAGNOSIS — Z170 Estrogen receptor positive status [ER+]: Secondary | ICD-10-CM | POA: Insufficient documentation

## 2022-06-04 DIAGNOSIS — C50411 Malignant neoplasm of upper-outer quadrant of right female breast: Secondary | ICD-10-CM | POA: Diagnosis present

## 2022-06-04 MED ORDER — TECHNETIUM TC 99M MEDRONATE IV KIT
20.0000 | PACK | Freq: Once | INTRAVENOUS | Status: AC | PRN
  Administered 2022-06-04: 20.5 via INTRAVENOUS

## 2022-06-05 ENCOUNTER — Inpatient Hospital Stay (HOSPITAL_COMMUNITY): Payer: Medicare Other

## 2022-06-05 ENCOUNTER — Ambulatory Visit (HOSPITAL_COMMUNITY): Payer: Medicare Other

## 2022-06-09 ENCOUNTER — Inpatient Hospital Stay (HOSPITAL_COMMUNITY): Payer: Medicare Other | Attending: Hematology

## 2022-06-09 ENCOUNTER — Ambulatory Visit (HOSPITAL_COMMUNITY)
Admission: RE | Admit: 2022-06-09 | Discharge: 2022-06-09 | Disposition: A | Discharge status: Home / Self Care | Payer: Medicare Other | Source: Ambulatory Visit | Attending: Hematology | Admitting: Hematology

## 2022-06-09 DIAGNOSIS — Z5111 Encounter for antineoplastic chemotherapy: Secondary | ICD-10-CM | POA: Insufficient documentation

## 2022-06-09 DIAGNOSIS — D649 Anemia, unspecified: Secondary | ICD-10-CM

## 2022-06-09 DIAGNOSIS — Z170 Estrogen receptor positive status [ER+]: Secondary | ICD-10-CM | POA: Insufficient documentation

## 2022-06-09 DIAGNOSIS — C50411 Malignant neoplasm of upper-outer quadrant of right female breast: Secondary | ICD-10-CM | POA: Diagnosis present

## 2022-06-09 DIAGNOSIS — C7951 Secondary malignant neoplasm of bone: Secondary | ICD-10-CM | POA: Insufficient documentation

## 2022-06-09 DIAGNOSIS — C50911 Malignant neoplasm of unspecified site of right female breast: Secondary | ICD-10-CM | POA: Insufficient documentation

## 2022-06-09 LAB — CBC WITH DIFFERENTIAL/PLATELET
Abs Immature Granulocytes: 0.01 10*3/uL (ref 0.00–0.07)
Basophils Absolute: 0 10*3/uL (ref 0.0–0.1)
Basophils Relative: 1 %
Eosinophils Absolute: 0.2 10*3/uL (ref 0.0–0.5)
Eosinophils Relative: 4 %
HCT: 34.4 % — ABNORMAL LOW (ref 36.0–46.0)
Hemoglobin: 11.2 g/dL — ABNORMAL LOW (ref 12.0–15.0)
Immature Granulocytes: 0 %
Lymphocytes Relative: 23 %
Lymphs Abs: 1 10*3/uL (ref 0.7–4.0)
MCH: 33.7 pg (ref 26.0–34.0)
MCHC: 32.6 g/dL (ref 30.0–36.0)
MCV: 103.6 fL — ABNORMAL HIGH (ref 80.0–100.0)
Monocytes Absolute: 0.3 10*3/uL (ref 0.1–1.0)
Monocytes Relative: 7 %
Neutro Abs: 2.9 10*3/uL (ref 1.7–7.7)
Neutrophils Relative %: 65 %
Platelets: 203 10*3/uL (ref 150–400)
RBC: 3.32 MIL/uL — ABNORMAL LOW (ref 3.87–5.11)
RDW: 13.8 % (ref 11.5–15.5)
WBC: 4.4 10*3/uL (ref 4.0–10.5)
nRBC: 0 % (ref 0.0–0.2)

## 2022-06-09 LAB — IRON AND TIBC
Iron: 67 ug/dL (ref 28–170)
Saturation Ratios: 23 % (ref 10.4–31.8)
TIBC: 289 ug/dL (ref 250–450)
UIBC: 222 ug/dL

## 2022-06-09 LAB — FERRITIN: Ferritin: 36 ng/mL (ref 11–307)

## 2022-06-09 LAB — COMPREHENSIVE METABOLIC PANEL
ALT: 15 U/L (ref 0–44)
AST: 18 U/L (ref 15–41)
Albumin: 3.6 g/dL (ref 3.5–5.0)
Alkaline Phosphatase: 40 U/L (ref 38–126)
Anion gap: 4 — ABNORMAL LOW (ref 5–15)
BUN: 21 mg/dL (ref 8–23)
CO2: 31 mmol/L (ref 22–32)
Calcium: 9.2 mg/dL (ref 8.9–10.3)
Chloride: 105 mmol/L (ref 98–111)
Creatinine, Ser: 0.58 mg/dL (ref 0.44–1.00)
GFR, Estimated: >60 mL/min (ref >60)
Glucose, Bld: 108 mg/dL — ABNORMAL HIGH (ref 70–99)
Potassium: 4.6 mmol/L (ref 3.5–5.1)
Sodium: 140 mmol/L (ref 135–145)
Total Bilirubin: 0.9 mg/dL (ref 0.3–1.2)
Total Protein: 6.5 g/dL (ref 6.5–8.1)

## 2022-06-09 MED ORDER — IOHEXOL 300 MG/ML  SOLN
100.0000 mL | Freq: Once | INTRAMUSCULAR | Status: AC | PRN
  Administered 2022-06-09: 100 mL via INTRAVENOUS

## 2022-06-10 ENCOUNTER — Inpatient Hospital Stay (HOSPITAL_COMMUNITY): Payer: Medicare Other | Admitting: Hematology

## 2022-06-10 ENCOUNTER — Inpatient Hospital Stay (HOSPITAL_COMMUNITY): Payer: Medicare Other

## 2022-06-10 LAB — CANCER ANTIGEN 27.29: CA 27.29: 11 U/mL (ref 0.0–38.6)

## 2022-06-10 LAB — CANCER ANTIGEN 15-3: CA 15-3: 17.1 U/mL (ref 0.0–25.0)

## 2022-06-12 ENCOUNTER — Inpatient Hospital Stay (HOSPITAL_COMMUNITY): Payer: Medicare Other

## 2022-06-12 ENCOUNTER — Inpatient Hospital Stay (HOSPITAL_BASED_OUTPATIENT_CLINIC_OR_DEPARTMENT_OTHER): Payer: Medicare Other | Admitting: Hematology

## 2022-06-12 VITALS — BP 147/65 | HR 80 | Temp 98.6°F | Resp 16

## 2022-06-12 DIAGNOSIS — Z170 Estrogen receptor positive status [ER+]: Secondary | ICD-10-CM | POA: Diagnosis not present

## 2022-06-12 DIAGNOSIS — C50911 Malignant neoplasm of unspecified site of right female breast: Secondary | ICD-10-CM

## 2022-06-12 DIAGNOSIS — C7951 Secondary malignant neoplasm of bone: Secondary | ICD-10-CM | POA: Diagnosis present

## 2022-06-12 DIAGNOSIS — Z5111 Encounter for antineoplastic chemotherapy: Secondary | ICD-10-CM | POA: Diagnosis not present

## 2022-06-12 MED ORDER — DENOSUMAB 120 MG/1.7ML ~~LOC~~ SOLN
120.0000 mg | Freq: Once | SUBCUTANEOUS | Status: AC
  Administered 2022-06-12: 120 mg via SUBCUTANEOUS
  Filled 2022-06-12: qty 1.7

## 2022-06-12 MED ORDER — FULVESTRANT 250 MG/5ML IM SOSY
500.0000 mg | PREFILLED_SYRINGE | Freq: Once | INTRAMUSCULAR | Status: AC
  Administered 2022-06-12: 500 mg via INTRAMUSCULAR
  Filled 2022-06-12: qty 10

## 2022-06-12 NOTE — Progress Notes (Signed)
[facility] [address] [place] [address]   Patient Care Team: Glenda Chroman, MD as PCP - General (Internal Medicine) Harl Bowie Alphonse Guild, MD as PCP - Cardiology (Cardiology) Brien Mates, RN as Oncology Nurse Navigator (Oncology)  SUMMARY OF ONCOLOGIC HISTORY: Oncology History  Metastatic breast cancer  11/23/2017 Initial Diagnosis   Metastatic breast cancer ([facility])   02/07/2021 Cancer Staging   Staging form: Breast, AJCC 8th Edition - Clinical stage from 02/07/2021: Stage IV (cT2, cN1, pM1, G1, ER+, PR+, HER2: Equivocal) - Signed by Derek Jack, MD on 02/07/2021 Histopathologic type: Infiltrating duct carcinoma, NOS Stage prefix: Initial diagnosis Histologic grading system: 3 grade system   02/07/2021 - 02/07/2021 Chemotherapy           CHIEF COMPLIANT: Follow-up of 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 is a 76 y.o. 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 Her last visit was on 03/05/2022.   Today she reports feeling good. She has intermittent groin pain which she reports radiates from her back. She continues to wear adult diapers due to incontinence, and she reports right shoulder pain when adjusting her diapers. She takes 1 iron tablet daily. She takes Oxycodone 5 times daily prn. She reports constipation.   REVIEW OF SYSTEMS:   Review of Systems  Constitutional:  Negative for appetite change and fatigue.  Respiratory:  Positive for cough.   Gastrointestinal:  Positive for constipation and diarrhea.  Genitourinary:  Positive for bladder incontinence and pelvic pain (groin).   Musculoskeletal:  Positive for arthralgias (9/10 R knee/thigh/hip/shoulder) and back pain.  Neurological:  Positive for dizziness and numbness (L leg).  Psychiatric/Behavioral:  Positive for sleep disturbance.   All other systems reviewed and are negative.   I have reviewed  the past medical history, past surgical history, social history and family history with the patient and they are unchanged from previous note.   ALLERGIES:   is allergic to brilinta [ticagrelor], budesonide, and albuterol.   MEDICATIONS:  Current Outpatient Medications  Medication Sig Dispense Refill   acetaminophen (TYLENOL) 325 MG tablet Take 2 tablets (650 mg total) by mouth every 6 (six) hours as needed for mild pain, fever or headache. 12 tablet 0   amLODipine (NORVASC) 5 MG tablet TAKE 1 TABLET (5 MG TOTAL) BY MOUTH DAILY 90 tablet 2   aspirin EC 81 MG tablet Take 1 tablet (81 mg total) by mouth daily with breakfast. Swallow whole. 30 tablet 11   atorvastatin (LIPITOR) 80 MG tablet Take 1 tablet (80 mg total) by mouth daily. 10 tablet 0   b complex vitamins tablet Take 1 tablet by mouth every morning. 30 tablet 0   carboxymethylcellulose (REFRESH PLUS) 0.5 % SOLN Place 1 drop into both eyes in the morning, at noon, in the evening, and at bedtime.     clopidogrel (PLAVIX) 75 MG tablet TAKE 1 TABLET EVERY DAY 90 tablet 3   DULoxetine (CYMBALTA) 30 MG capsule Take 3 capsules (90 mg total) by mouth daily. (Patient taking differently: Take 30 mg by mouth 2 (two) times daily.) 30 capsule 0   DULoxetine (CYMBALTA) 30 MG capsule Take by mouth.     levalbuterol (XOPENEX HFA) 45 MCG/ACT inhaler Inhale 1 puff into the lungs every 4 (four) hours as needed for wheezing. 1 each 2   losartan (COZAAR) 100 MG tablet Take 1 tablet (100 mg total) by  mouth daily. (Patient taking differently: Take 50 mg by mouth daily.) 90 tablet 3   metoprolol succinate (TOPROL-XL) 100 MG 24 hr tablet TAKE 1 AND 1/2 TABLETS DAILY (DOSE INCREASE) 135 tablet 2   Multiple Vitamin (MULTIVITAMIN ADULT PO) Take 1 tablet by mouth daily.     Multiple Vitamins-Minerals (EQ VISION FORMULA 50+ PO) Take 1 tablet by mouth every morning.     nitroGLYCERIN (NITROSTAT) 0.4 MG SL tablet Place 1 tablet (0.4 mg total) under the tongue every  5 (five) minutes x 3 doses as needed for chest pain (if no relief after 3rd dose, proceed to the ED or call 911). 25 tablet 3   Oxycodone HCl 10 MG TABS Take by mouth.     PAIN MANAGEMENT INTRATHECAL, IT, PUMP by Intrathecal route.     pregabalin (LYRICA) 50 MG capsule Take 50 mg by mouth 3 (three) times daily.     Spacer/Aero-Holding Chambers (AEROCHAMBER MV) inhaler Use as instructed 1 each 0   No current facility-administered medications for this visit.     PHYSICAL EXAMINATION: Performance status (ECOG): 4 - Bedbound  There were no vitals filed for this visit. Wt Readings from Last 3 Encounters:  05/09/22 205 lb (93 kg)  04/21/22 205 lb (93 kg)  03/25/22 192 lb 0.3 oz (87.1 kg)   Physical Exam Vitals reviewed.  Constitutional:      Appearance: Normal appearance.  Cardiovascular:     Rate and Rhythm: Normal rate and regular rhythm.     Pulses: Normal pulses.     Heart sounds: Normal heart sounds.  Pulmonary:     Effort: Pulmonary effort is normal.     Breath sounds: Normal breath sounds.  Neurological:     General: No focal deficit present.     Mental Status: She is alert and oriented to person, place, and time.  Psychiatric:        Mood and Affect: Mood normal.        Behavior: Behavior normal.     Breast Exam Chaperone: Thana Ates     LABORATORY DATA:  I have reviewed the data as listed    Latest Ref Rng & Units 06/09/2022   10:34 AM 05/13/2022   12:23 PM 05/09/2022    6:13 PM  CMP  Glucose 70 - 99 mg/dL 108  143  98   BUN 8 - 23 mg/dL '21  15  16   ' Creatinine 0.44 - 1.00 mg/dL 0.58  0.69  0.64   Sodium 135 - 145 mmol/L 140  142  140   Potassium 3.5 - 5.1 mmol/L 4.6  5.0  3.8   Chloride 98 - 111 mmol/L 105  107  104   CO2 22 - 32 mmol/L 31  32  30   Calcium 8.9 - 10.3 mg/dL 9.2  9.0  9.0   Total Protein 6.5 - 8.1 g/dL 6.5  5.8    Total Bilirubin 0.3 - 1.2 mg/dL 0.9  0.8    Alkaline Phos 38 - 126 U/L 40  37    AST 15 - 41 U/L 18  20    ALT 0 - 44 U/L 15   13     Lab Results  Component Value Date   CAN153 17.1 06/09/2022   CAN153 16.7 02/18/2022   CAN153 20.4 10/01/2021   Lab Results  Component Value Date   WBC 4.4 06/09/2022   HGB 11.2 (L) 06/09/2022   HCT 34.4 (L) 06/09/2022   MCV 103.6 (H) 06/09/2022  PLT 203 06/09/2022   NEUTROABS 2.9 06/09/2022    ASSESSMENT:  1.  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 -Ultrasound-guided biopsy on 01/07/2016 showed grade 1 invasive ductal carcinoma, ER positive, PR positive, HER-2 equivocal by FISH, right axillary lymph node with metastatic adenocarcinoma.  HER-2 equivocal since her 2/CEP17 ratio less than 2 and average copy number of her 2 is greater than or equal to 4 but less than 6 signals per cell. -Bone biopsy on 05/30/2016 consistent with metastatic breast cancer. -Palliative therapy with letrozole started under the direction of Dr. Deitra Mayo at [facility] from 02/25/2016 through February 2020, progressive symptoms with increased soft tissue extension at L2. 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 -Addition of Leslee Home was discussed but she was concerned about cost of the medication. -CT abdomen and pelvis on 08/13/2020 shows acute compression fracture deformity of the L1 vertebral body and other chronic findings. -Her last Faslodex injection at [facility] was on 11/06/2020. -CT CAP on 02/19/2021 with no evidence of for solid organ metastasis or nodal metastasis.  Right breast lesion is stable.  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 -Bone scan on 02/19/2021 was negative for bone mets.   2.  Social/family history: -She worked for [employer] prior to retirement in 2010. -She is currently living with her son who is  helping with her day-to-day activities.  Daughter lives in [place].  She quit smoking 35 to 40 years ago. -She is mostly confined to bed.  However she is able to change clothes, able to wash upper body, halfway dress herself.  She cannot stand up for the past 2 years because of weakness in the legs and knee arthritis.  She wears adult diapers.   PLAN:  1.  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 - Reviewed labs today which shows normal LFTs and CBC.  Tumor markers including CA 15-3 and CA 27-29 is normal. - Continue Faslodex monthly. - RTC 3 months for follow-up.  I will repeat tumor markers.  Imaging in 6 months.   2.  Cancer associated pain: - Continue oxycodone 10 mg 3-4 times daily for breakthrough pain. - She had a refill of morphine/bupivacaine intrathecal pump last week.  3.  Right lower quadrant abdominal pain: - She has pain pump refilled last week at [facility].  Reports pain not well controlled.  She will talk to her pain specialist soon.  She also reports bilateral groin pains.  Also reports right shoulder pain from exercising.  4.  Bone metastasis: - Calcium is normal today.  Continue denosumab monthly.  5.  Macrocytic anemia: - Hemoglobin is 11.2.  Ferritin is 36 and percent saturation 23. - Continue iron tablet daily.  We will check iron panel next visit.  If no improvement will consider parenteral iron therapy.  Breast Cancer therapy associated bone loss: I have recommended calcium, Vitamin D and weight bearing exercises.  Orders placed this encounter:  No orders of the defined types were placed in this encounter.   The patient has a good understanding of the overall plan. She agrees with it. She will call with any problems that may develop  before the next visit here.  Derek Jack, MD [facility] [phone]   I, Thana Ates, am acting  as a scribe for Dr. Derek Jack.  I, Derek Jack MD, have reviewed the above documentation for accuracy and completeness, and I agree with the above.

## 2022-06-12 NOTE — Patient Instructions (Signed)
[facility]  Discharge Instructions: Thank you for choosing [facility] to provide your oncology and hematology care.  If you have a lab appointment with the [facility], please come in thru the Main Entrance and check in at the main information desk.  Wear comfortable clothing and clothing appropriate for easy access to any Portacath or PICC line.   We strive to give you quality time with your provider. You may need to reschedule your appointment if you arrive late (15 or more minutes).  Arriving late affects you and other patients whose appointments are after yours.  Also, if you miss three or more appointments without notifying the office, you may be dismissed from the clinic at the provider's discretion.      For prescription refill requests, have your pharmacy contact our office and allow 72 hours for refills to be completed.    Today you received Xgeva and Faslodex injections.   BELOW ARE SYMPTOMS THAT SHOULD BE REPORTED IMMEDIATELY: *FEVER GREATER THAN 100.4 F (38 C) OR HIGHER *CHILLS OR SWEATING *NAUSEA AND VOMITING THAT IS NOT CONTROLLED WITH YOUR NAUSEA MEDICATION *UNUSUAL SHORTNESS OF BREATH *UNUSUAL BRUISING OR BLEEDING *URINARY PROBLEMS (pain or burning when urinating, or frequent urination) *BOWEL PROBLEMS (unusual diarrhea, constipation, pain near the anus) TENDERNESS IN MOUTH AND THROAT WITH OR WITHOUT PRESENCE OF ULCERS (sore throat, sores in mouth, or a toothache) UNUSUAL RASH, SWELLING OR PAIN  UNUSUAL VAGINAL DISCHARGE OR ITCHING   Items with * indicate a potential emergency and should be followed up as soon as possible or go to the Emergency Department if any problems should occur.  Please show the CHEMOTHERAPY ALERT CARD or IMMUNOTHERAPY ALERT CARD at check-in to the Emergency Department and triage nurse.  Should you have questions after your visit or need to cancel or reschedule your appointment, please contact [facility]  [phone]  and follow the prompts.  Office hours are 8:00 a.m. to 4:30 p.m. Monday - Friday. Please note that voicemails left after 4:00 p.m. may not be returned until the following business day.  We are closed weekends and major holidays. You have access to a nurse at all times for urgent questions. Please call the main number to the clinic [phone] and follow the prompts.  For any non-urgent questions, you may also contact your provider using MyChart. We now offer e-Visits for anyone 82 and older to request care online for non-urgent symptoms. For details visit [email or web address].   Also download the MyChart app! Go to the app store, search "MyChart", open the app, select [facility], and log in with your MyChart username and password.  Masks are optional in the cancer centers. If you would like for your care team to wear a mask while they are taking care of you, please let them know. For doctor visits, patients may have with them one support person who is at least 75 years old. At this time, visitors are not allowed in the infusion area.

## 2022-06-12 NOTE — Patient Instructions (Addendum)
[facility] at [facility] Discharge Instructions   You were seen and examined today by Dr. Delton Coombes.  He reviewed the results of you lab work which are normal.   He reviewed the results of your CT scan which is stable.   Return as scheduled in 3 months.    Thank you for choosing [facility] at [facility] to provide your oncology and hematology care.  To afford each patient quality time with our provider, please arrive at least 15 minutes before your scheduled appointment time.   If you have a lab appointment with the [facility] please come in thru the Main Entrance and check in at the main information desk.  You need to re-schedule your appointment should you arrive 10 or more minutes late.  We strive to give you quality time with our providers, and arriving late affects you and other patients whose appointments are after yours.  Also, if you no show three or more times for appointments you may be dismissed from the clinic at the providers discretion.     Again, thank you for choosing [facility].  Our hope is that these requests will decrease the amount of time that you wait before being seen by our physicians.       _____________________________________________________________  Should you have questions after your visit to [facility], please contact our office at [phone] and follow the prompts.  Our office hours are 8:00 a.m. and 4:30 p.m. Monday - Friday.  Please note that voicemails left after 4:00 p.m. may not be returned until the following business day.  We are closed weekends and major holidays.  You do have access to a nurse 24-7, just call the main number to the clinic [phone] and do not press any options, hold on the line and a nurse will answer the phone.    For prescription refill requests, have your pharmacy contact our office and allow 72 hours.    Due to Covid, you will need to wear a  mask upon entering the hospital. If you do not have a mask, a mask will be given to you at the Main Entrance upon arrival. For doctor visits, patients may have 1 support person age 4 or older with them. For treatment visits, patients can not have anyone with them due to social distancing guidelines and our immunocompromised population.

## 2022-06-12 NOTE — Progress Notes (Signed)
Stacey [name] presents today for injection per the provider's orders.  Xgeva and Faslodex injections administration without incident; injection site WNL; see MAR for injection details.  Patient tolerated procedure well and without incident.  No questions or complaints noted at this time. Pt's Calcium noted to be 9.2.   Delton See and Faslodex given today per MD orders. Tolerated infusion without adverse affects. Vital signs stable. No complaints at this time. Discharged from clinic on stretcher via EMS in stable condition. Alert and oriented x 3. F/U with [facility] as scheduled.

## 2022-06-13 ENCOUNTER — Other Ambulatory Visit: Payer: Self-pay | Admitting: Cardiology

## 2022-07-01 ENCOUNTER — Telehealth: Payer: Medicare Other | Admitting: Cardiology

## 2022-07-15 ENCOUNTER — Inpatient Hospital Stay: Payer: Medicare Other | Attending: Hematology

## 2022-07-15 ENCOUNTER — Ambulatory Visit (HOSPITAL_COMMUNITY): Payer: Medicare Other | Admitting: Hematology

## 2022-07-15 ENCOUNTER — Inpatient Hospital Stay: Payer: Medicare Other

## 2022-07-15 VITALS — BP 122/56 | HR 72 | Temp 97.8°F | Resp 18

## 2022-07-15 DIAGNOSIS — Z5111 Encounter for antineoplastic chemotherapy: Secondary | ICD-10-CM | POA: Insufficient documentation

## 2022-07-15 DIAGNOSIS — C50911 Malignant neoplasm of unspecified site of right female breast: Secondary | ICD-10-CM | POA: Diagnosis present

## 2022-07-15 DIAGNOSIS — Z170 Estrogen receptor positive status [ER+]: Secondary | ICD-10-CM | POA: Diagnosis not present

## 2022-07-15 DIAGNOSIS — D649 Anemia, unspecified: Secondary | ICD-10-CM

## 2022-07-15 DIAGNOSIS — C7951 Secondary malignant neoplasm of bone: Secondary | ICD-10-CM | POA: Diagnosis present

## 2022-07-15 LAB — COMPREHENSIVE METABOLIC PANEL
ALT: 14 U/L (ref 0–44)
AST: 19 U/L (ref 15–41)
Albumin: 3.2 g/dL — ABNORMAL LOW (ref 3.5–5.0)
Alkaline Phosphatase: 34 U/L — ABNORMAL LOW (ref 38–126)
Anion gap: 6 (ref 5–15)
BUN: 22 mg/dL (ref 8–23)
CO2: 30 mmol/L (ref 22–32)
Calcium: 9.1 mg/dL (ref 8.9–10.3)
Chloride: 106 mmol/L (ref 98–111)
Creatinine, Ser: 0.7 mg/dL (ref 0.44–1.00)
GFR, Estimated: >60 mL/min (ref >60)
Glucose, Bld: 127 mg/dL — ABNORMAL HIGH (ref 70–99)
Potassium: 4.7 mmol/L (ref 3.5–5.1)
Sodium: 142 mmol/L (ref 135–145)
Total Bilirubin: 0.7 mg/dL (ref 0.3–1.2)
Total Protein: 5.7 g/dL — ABNORMAL LOW (ref 6.5–8.1)

## 2022-07-15 MED ORDER — DENOSUMAB 120 MG/1.7ML ~~LOC~~ SOLN
120.0000 mg | Freq: Once | SUBCUTANEOUS | Status: AC
  Administered 2022-07-15: 120 mg via SUBCUTANEOUS
  Filled 2022-07-15: qty 1.7

## 2022-07-15 MED ORDER — FULVESTRANT 250 MG/5ML IM SOSY
500.0000 mg | PREFILLED_SYRINGE | Freq: Once | INTRAMUSCULAR | Status: AC
  Administered 2022-07-15: 500 mg via INTRAMUSCULAR
  Filled 2022-07-15: qty 10

## 2022-07-15 NOTE — Progress Notes (Signed)
Patient is currently taking her Calcium/Vitamin D supplements, has had no prior or up coming dental work and no jaw pain.  Stable during administration without incident; injection sites WNL; see MAR for injection details.  Patient tolerated procedure well and without incident.  No questions or complaints noted at this time. Discharge from clinic by EMS via stretcher in stable condition.  Alert and oriented X 3.  Follow up with [facility] as scheduled.

## 2022-07-15 NOTE — Progress Notes (Signed)
Patient presents today for Xgeva and Faslodex injections.  Patient is in satisfactory condition with no new complaints voiced.  Vital signs are stable.

## 2022-07-15 NOTE — Patient Instructions (Signed)
[facility]  Discharge Instructions: Thank you for choosing [facility] to provide your oncology and hematology care.  If you have a lab appointment with the [facility], please come in thru the Main Entrance and check in at the main information desk.  Wear comfortable clothing and clothing appropriate for easy access to any Portacath or PICC line.   We strive to give you quality time with your provider. You may need to reschedule your appointment if you arrive late (15 or more minutes).  Arriving late affects you and other patients whose appointments are after yours.  Also, if you miss three or more appointments without notifying the office, you may be dismissed from the clinic at the provider's discretion.      For prescription refill requests, have your pharmacy contact our office and allow 72 hours for refills to be completed.    Today you received the following chemotherapy and/or immunotherapy agents Faslodex, Xgeva      To help prevent nausea and vomiting after your treatment, we encourage you to take your nausea medication as directed.  BELOW ARE SYMPTOMS THAT SHOULD BE REPORTED IMMEDIATELY: *FEVER GREATER THAN 100.4 F (38 C) OR HIGHER *CHILLS OR SWEATING *NAUSEA AND VOMITING THAT IS NOT CONTROLLED WITH YOUR NAUSEA MEDICATION *UNUSUAL SHORTNESS OF BREATH *UNUSUAL BRUISING OR BLEEDING *URINARY PROBLEMS (pain or burning when urinating, or frequent urination) *BOWEL PROBLEMS (unusual diarrhea, constipation, pain near the anus) TENDERNESS IN MOUTH AND THROAT WITH OR WITHOUT PRESENCE OF ULCERS (sore throat, sores in mouth, or a toothache) UNUSUAL RASH, SWELLING OR PAIN  UNUSUAL VAGINAL DISCHARGE OR ITCHING   Items with * indicate a potential emergency and should be followed up as soon as possible or go to the Emergency Department if any problems should occur.  Please show the CHEMOTHERAPY ALERT CARD or IMMUNOTHERAPY ALERT CARD at check-in to the  Emergency Department and triage nurse.  Should you have questions after your visit or need to cancel or reschedule your appointment, please contact [facility] [phone]  and follow the prompts.  Office hours are 8:00 a.m. to 4:30 p.m. Monday - Friday. Please note that voicemails left after 4:00 p.m. may not be returned until the following business day.  We are closed weekends and major holidays. You have access to a nurse at all times for urgent questions. Please call the main number to the clinic [phone] and follow the prompts.  For any non-urgent questions, you may also contact your provider using MyChart. We now offer e-Visits for anyone 75 and older to request care online for non-urgent symptoms. For details visit [email or web address].   Also download the MyChart app! Go to the app store, search "MyChart", open the app, select [facility], and log in with your MyChart username and password.  Masks are optional in the cancer centers. If you would like for your care team to wear a mask while they are taking care of you, please let them know. For doctor visits, patients may have with them one support person who is at least 75 years old. At this time, visitors are not allowed in the infusion area.

## 2022-08-12 ENCOUNTER — Inpatient Hospital Stay: Payer: Medicare Other

## 2022-08-12 ENCOUNTER — Inpatient Hospital Stay: Payer: Medicare Other | Attending: Hematology

## 2022-08-12 VITALS — BP 125/68 | HR 65 | Temp 97.3°F | Resp 18

## 2022-08-12 DIAGNOSIS — C50411 Malignant neoplasm of upper-outer quadrant of right female breast: Secondary | ICD-10-CM

## 2022-08-12 DIAGNOSIS — C7951 Secondary malignant neoplasm of bone: Secondary | ICD-10-CM | POA: Diagnosis present

## 2022-08-12 DIAGNOSIS — C50911 Malignant neoplasm of unspecified site of right female breast: Secondary | ICD-10-CM | POA: Diagnosis present

## 2022-08-12 DIAGNOSIS — Z5111 Encounter for antineoplastic chemotherapy: Secondary | ICD-10-CM | POA: Diagnosis present

## 2022-08-12 DIAGNOSIS — Z170 Estrogen receptor positive status [ER+]: Secondary | ICD-10-CM | POA: Diagnosis not present

## 2022-08-12 LAB — COMPREHENSIVE METABOLIC PANEL
ALT: 15 U/L (ref 0–44)
AST: 23 U/L (ref 15–41)
Albumin: 3.2 g/dL — ABNORMAL LOW (ref 3.5–5.0)
Alkaline Phosphatase: 38 U/L (ref 38–126)
Anion gap: 5 (ref 5–15)
BUN: 20 mg/dL (ref 8–23)
CO2: 32 mmol/L (ref 22–32)
Calcium: 8.9 mg/dL (ref 8.9–10.3)
Chloride: 104 mmol/L (ref 98–111)
Creatinine, Ser: 0.73 mg/dL (ref 0.44–1.00)
GFR, Estimated: >60 mL/min (ref >60)
Glucose, Bld: 108 mg/dL — ABNORMAL HIGH (ref 70–99)
Potassium: 4.9 mmol/L (ref 3.5–5.1)
Sodium: 141 mmol/L (ref 135–145)
Total Bilirubin: 1.2 mg/dL (ref 0.3–1.2)
Total Protein: 5.9 g/dL — ABNORMAL LOW (ref 6.5–8.1)

## 2022-08-12 MED ORDER — DENOSUMAB 120 MG/1.7ML ~~LOC~~ SOLN
120.0000 mg | Freq: Once | SUBCUTANEOUS | Status: AC
  Administered 2022-08-12: 120 mg via SUBCUTANEOUS
  Filled 2022-08-12: qty 1.7

## 2022-08-12 MED ORDER — FULVESTRANT 250 MG/5ML IM SOSY
500.0000 mg | PREFILLED_SYRINGE | Freq: Once | INTRAMUSCULAR | Status: AC
  Administered 2022-08-12: 500 mg via INTRAMUSCULAR
  Filled 2022-08-12: qty 10

## 2022-08-12 NOTE — Progress Notes (Signed)
Patient presents today for Xgeva and Faslodex injections per parameters.  Vital signs WNL.  Labs pending.  Patient has no new complaints at this time.  Labs reviewed and Calcium noted to be 8.9.  Stable during administration without incident; injection site WNL; see MAR for injection details.  Patient tolerated procedure well and without incident.  No questions or complaints noted at this time.

## 2022-08-12 NOTE — Patient Instructions (Signed)
[facility]  Discharge Instructions: Thank you for choosing [facility] to provide your oncology and hematology care.  If you have a lab appointment with the [facility], please come in thru the Main Entrance and check in at the main information desk.  Wear comfortable clothing and clothing appropriate for easy access to any Portacath or PICC line.   We strive to give you quality time with your provider. You may need to reschedule your appointment if you arrive late (15 or more minutes).  Arriving late affects you and other patients whose appointments are after yours.  Also, if you miss three or more appointments without notifying the office, you may be dismissed from the clinic at the provider's discretion.      For prescription refill requests, have your pharmacy contact our office and allow 72 hours for refills to be completed.    Today you received the following chemotherapy and/or immunotherapy agents Xgeva Faslodex      To help prevent nausea and vomiting after your treatment, we encourage you to take your nausea medication as directed.  BELOW ARE SYMPTOMS THAT SHOULD BE REPORTED IMMEDIATELY: *FEVER GREATER THAN 100.4 F (38 C) OR HIGHER *CHILLS OR SWEATING *NAUSEA AND VOMITING THAT IS NOT CONTROLLED WITH YOUR NAUSEA MEDICATION *UNUSUAL SHORTNESS OF BREATH *UNUSUAL BRUISING OR BLEEDING *URINARY PROBLEMS (pain or burning when urinating, or frequent urination) *BOWEL PROBLEMS (unusual diarrhea, constipation, pain near the anus) TENDERNESS IN MOUTH AND THROAT WITH OR WITHOUT PRESENCE OF ULCERS (sore throat, sores in mouth, or a toothache) UNUSUAL RASH, SWELLING OR PAIN  UNUSUAL VAGINAL DISCHARGE OR ITCHING   Items with * indicate a potential emergency and should be followed up as soon as possible or go to the Emergency Department if any problems should occur.  Please show the CHEMOTHERAPY ALERT CARD or IMMUNOTHERAPY ALERT CARD at check-in to the  Emergency Department and triage nurse.  Should you have questions after your visit or need to cancel or reschedule your appointment, please contact [facility] [phone]  and follow the prompts.  Office hours are 8:00 a.m. to 4:30 p.m. Monday - Friday. Please note that voicemails left after 4:00 p.m. may not be returned until the following business day.  We are closed weekends and major holidays. You have access to a nurse at all times for urgent questions. Please call the main number to the clinic [phone] and follow the prompts.  For any non-urgent questions, you may also contact your provider using MyChart. We now offer e-Visits for anyone 94 and older to request care online for non-urgent symptoms. For details visit [email or web address].   Also download the MyChart app! Go to the app store, search "MyChart", open the app, select [facility], and log in with your MyChart username and password.  Masks are optional in the cancer centers. If you would like for your care team to wear a mask while they are taking care of you, please let them know. You may have one support person who is at least 75 years old accompany you for your appointments.

## 2022-09-09 ENCOUNTER — Inpatient Hospital Stay: Payer: Medicare Other | Attending: Hematology | Admitting: Hematology

## 2022-09-09 ENCOUNTER — Other Ambulatory Visit: Payer: Self-pay

## 2022-09-09 ENCOUNTER — Inpatient Hospital Stay: Payer: Medicare Other

## 2022-09-09 ENCOUNTER — Other Ambulatory Visit: Payer: Self-pay | Admitting: Cardiology

## 2022-09-09 VITALS — BP 131/50 | HR 72 | Temp 97.9°F | Resp 18

## 2022-09-09 DIAGNOSIS — Z5111 Encounter for antineoplastic chemotherapy: Secondary | ICD-10-CM | POA: Diagnosis present

## 2022-09-09 DIAGNOSIS — C7951 Secondary malignant neoplasm of bone: Secondary | ICD-10-CM | POA: Insufficient documentation

## 2022-09-09 DIAGNOSIS — Z170 Estrogen receptor positive status [ER+]: Secondary | ICD-10-CM

## 2022-09-09 DIAGNOSIS — C50911 Malignant neoplasm of unspecified site of right female breast: Secondary | ICD-10-CM | POA: Diagnosis present

## 2022-09-09 DIAGNOSIS — C50411 Malignant neoplasm of upper-outer quadrant of right female breast: Secondary | ICD-10-CM

## 2022-09-09 DIAGNOSIS — D649 Anemia, unspecified: Secondary | ICD-10-CM

## 2022-09-09 LAB — CBC WITH DIFFERENTIAL/PLATELET
Abs Immature Granulocytes: 0.03 10*3/uL (ref 0.00–0.07)
Basophils Absolute: 0 10*3/uL (ref 0.0–0.1)
Basophils Relative: 1 %
Eosinophils Absolute: 0.2 10*3/uL (ref 0.0–0.5)
Eosinophils Relative: 4 %
HCT: 34.1 % — ABNORMAL LOW (ref 36.0–46.0)
Hemoglobin: 11.1 g/dL — ABNORMAL LOW (ref 12.0–15.0)
Immature Granulocytes: 1 %
Lymphocytes Relative: 21 %
Lymphs Abs: 1.1 10*3/uL (ref 0.7–4.0)
MCH: 33.9 pg (ref 26.0–34.0)
MCHC: 32.6 g/dL (ref 30.0–36.0)
MCV: 104.3 fL — ABNORMAL HIGH (ref 80.0–100.0)
Monocytes Absolute: 0.4 10*3/uL (ref 0.1–1.0)
Monocytes Relative: 8 %
Neutro Abs: 3.3 10*3/uL (ref 1.7–7.7)
Neutrophils Relative %: 65 %
Platelets: 198 10*3/uL (ref 150–400)
RBC: 3.27 MIL/uL — ABNORMAL LOW (ref 3.87–5.11)
RDW: 13.2 % (ref 11.5–15.5)
WBC: 4.9 10*3/uL (ref 4.0–10.5)
nRBC: 0 % (ref 0.0–0.2)

## 2022-09-09 LAB — COMPREHENSIVE METABOLIC PANEL
ALT: 16 U/L (ref 0–44)
AST: 22 U/L (ref 15–41)
Albumin: 3.3 g/dL — ABNORMAL LOW (ref 3.5–5.0)
Alkaline Phosphatase: 37 U/L — ABNORMAL LOW (ref 38–126)
Anion gap: 8 (ref 5–15)
BUN: 20 mg/dL (ref 8–23)
CO2: 29 mmol/L (ref 22–32)
Calcium: 8.9 mg/dL (ref 8.9–10.3)
Chloride: 104 mmol/L (ref 98–111)
Creatinine, Ser: 0.63 mg/dL (ref 0.44–1.00)
GFR, Estimated: >60 mL/min (ref >60)
Glucose, Bld: 116 mg/dL — ABNORMAL HIGH (ref 70–99)
Potassium: 4 mmol/L (ref 3.5–5.1)
Sodium: 141 mmol/L (ref 135–145)
Total Bilirubin: 1 mg/dL (ref 0.3–1.2)
Total Protein: 6.1 g/dL — ABNORMAL LOW (ref 6.5–8.1)

## 2022-09-09 LAB — IRON AND TIBC
Iron: 68 ug/dL (ref 28–170)
Saturation Ratios: 25 % (ref 10.4–31.8)
TIBC: 273 ug/dL (ref 250–450)
UIBC: 205 ug/dL

## 2022-09-09 LAB — MAGNESIUM: Magnesium: 2.1 mg/dL (ref 1.7–2.4)

## 2022-09-09 LAB — FERRITIN: Ferritin: 47 ng/mL (ref 11–307)

## 2022-09-09 MED ORDER — FULVESTRANT 250 MG/5ML IM SOSY
500.0000 mg | PREFILLED_SYRINGE | Freq: Once | INTRAMUSCULAR | Status: AC
  Administered 2022-09-09: 500 mg via INTRAMUSCULAR
  Filled 2022-09-09: qty 10

## 2022-09-09 MED ORDER — DENOSUMAB 120 MG/1.7ML ~~LOC~~ SOLN
120.0000 mg | Freq: Once | SUBCUTANEOUS | Status: AC
  Administered 2022-09-09: 120 mg via SUBCUTANEOUS
  Filled 2022-09-09: qty 1.7

## 2022-09-09 NOTE — Patient Instructions (Signed)
[facility]  Discharge Instructions: Thank you for choosing [facility] to provide your oncology and hematology care.  If you have a lab appointment with the [facility], please come in thru the Main Entrance and check in at the main information desk.  Wear comfortable clothing and clothing appropriate for easy access to any Portacath or PICC line.   We strive to give you quality time with your provider. You may need to reschedule your appointment if you arrive late (15 or more minutes).  Arriving late affects you and other patients whose appointments are after yours.  Also, if you miss three or more appointments without notifying the office, you may be dismissed from the clinic at the provider's discretion.      For prescription refill requests, have your pharmacy contact our office and allow 72 hours for refills to be completed.    Today you received the following xgeva and faslodex, return as scheduled.   To help prevent nausea and vomiting after your treatment, we encourage you to take your nausea medication as directed.  BELOW ARE SYMPTOMS THAT SHOULD BE REPORTED IMMEDIATELY: *FEVER GREATER THAN 100.4 F (38 C) OR HIGHER *CHILLS OR SWEATING *NAUSEA AND VOMITING THAT IS NOT CONTROLLED WITH YOUR NAUSEA MEDICATION *UNUSUAL SHORTNESS OF BREATH *UNUSUAL BRUISING OR BLEEDING *URINARY PROBLEMS (pain or burning when urinating, or frequent urination) *BOWEL PROBLEMS (unusual diarrhea, constipation, pain near the anus) TENDERNESS IN MOUTH AND THROAT WITH OR WITHOUT PRESENCE OF ULCERS (sore throat, sores in mouth, or a toothache) UNUSUAL RASH, SWELLING OR PAIN  UNUSUAL VAGINAL DISCHARGE OR ITCHING   Items with * indicate a potential emergency and should be followed up as soon as possible or go to the Emergency Department if any problems should occur.  Please show the CHEMOTHERAPY ALERT CARD or IMMUNOTHERAPY ALERT CARD at check-in to the Emergency Department  and triage nurse.  Should you have questions after your visit or need to cancel or reschedule your appointment, please contact [facility] [phone]  and follow the prompts.  Office hours are 8:00 a.m. to 4:30 p.m. Monday - Friday. Please note that voicemails left after 4:00 p.m. may not be returned until the following business day.  We are closed weekends and major holidays. You have access to a nurse at all times for urgent questions. Please call the main number to the clinic [phone] and follow the prompts.  For any non-urgent questions, you may also contact your provider using MyChart. We now offer e-Visits for anyone 86 and older to request care online for non-urgent symptoms. For details visit [email or web address].   Also download the MyChart app! Go to the app store, search "MyChart", open the app, select [facility], and log in with your MyChart username and password.  Masks are optional in the cancer centers. If you would like for your care team to wear a mask while they are taking care of you, please let them know. You may have one support person who is at least 75 years old accompany you for your appointments.

## 2022-09-09 NOTE — Progress Notes (Signed)
Patient taking calcium as directed. Denied tooth, jaw, and leg pain. No recent or upcoming dental visits. Labs reviewed. Patient tolerated injection with no complaints voiced. See MAR for details. Patient stable during and after injection. Site clean and dry with no bruising or swelling noted. Band aid applied.  Patient tolerated Faslodex injection with no complaints voiced. Bilateral sites clean and dry with no bruising or swelling noted. Band aids applied. See MAR for details. Patient stable during and after injections. VSS with discharge and left in satisfactory condition with no s/s of distress noted.

## 2022-09-09 NOTE — Progress Notes (Signed)
[facility] [address] [place] [address]   Patient Care Team: Glenda Chroman, MD as PCP - General (Internal Medicine) Harl Bowie Alphonse Guild, MD as PCP - Cardiology (Cardiology) Brien Mates, RN as Oncology Nurse Navigator (Oncology)  SUMMARY OF ONCOLOGIC HISTORY: Oncology History  Metastatic breast cancer  11/23/2017 Initial Diagnosis   Metastatic breast cancer ([facility])   02/07/2021 Cancer Staging   Staging form: Breast, AJCC 8th Edition - Clinical stage from 02/07/2021: Stage IV (cT2, cN1, pM1, G1, ER+, PR+, HER2: Equivocal) - Signed by Derek Jack, MD on 02/07/2021 Histopathologic type: Infiltrating duct carcinoma, NOS Stage prefix: Initial diagnosis Histologic grading system: 3 grade system   02/07/2021 - 02/07/2021 Chemotherapy           CHIEF COMPLIANT: Follow-up of 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 is a 76 y.o. 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  She is tearful today as her pain is not well controlled.  She reports pain all over but predominantly in the right lower back going down the right leg.  She reportedly has an appointment to see the [facility] pain specialist on Monday.  REVIEW OF SYSTEMS:   Review of Systems  Respiratory:  Positive for shortness of breath.   Gastrointestinal:  Positive for constipation.  Genitourinary:  Positive for pelvic pain (groin).   Musculoskeletal:  Positive for arthralgias (9/10 R knee/thigh/hip/shoulder) and back pain.  Neurological:  Positive for dizziness and numbness (L leg).  Psychiatric/Behavioral:  Positive for sleep disturbance.   All other systems reviewed and are negative.   I have reviewed the past medical history, past surgical history, social history and family history with the patient and they are unchanged from previous note.   ALLERGIES:   is allergic to brilinta [ticagrelor], budesonide, and  albuterol.   MEDICATIONS:  Current Outpatient Medications  Medication Sig Dispense Refill   acetaminophen (TYLENOL) 325 MG tablet Take 2 tablets (650 mg total) by mouth every 6 (six) hours as needed for mild pain, fever or headache. 12 tablet 0   amLODipine (NORVASC) 5 MG tablet TAKE 1 TABLET (5 MG TOTAL) BY MOUTH DAILY 90 tablet 2   aspirin EC 81 MG tablet Take 1 tablet (81 mg total) by mouth daily with breakfast. Swallow whole. 30 tablet 11   atorvastatin (LIPITOR) 80 MG tablet Take 1 tablet (80 mg total) by mouth daily. 10 tablet 0   b complex vitamins tablet Take 1 tablet by mouth every morning. 30 tablet 0   carboxymethylcellulose (REFRESH PLUS) 0.5 % SOLN Place 1 drop into both eyes in the morning, at noon, in the evening, and at bedtime.     clopidogrel (PLAVIX) 75 MG tablet TAKE 1 TABLET EVERY DAY 90 tablet 3   DULoxetine (CYMBALTA) 30 MG capsule Take 3 capsules (90 mg total) by mouth daily. (Patient taking differently: Take 30 mg by mouth 2 (two) times daily.) 30 capsule 0   DULoxetine (CYMBALTA) 30 MG capsule Take by mouth. (Patient not taking: Reported on 08/12/2022)     levalbuterol (XOPENEX HFA) 45 MCG/ACT inhaler Inhale 1 puff into the lungs every 4 (four) hours as needed for wheezing. (Patient not taking: Reported on 08/12/2022) 1 each 2   losartan (COZAAR) 100 MG tablet Take 1 tablet (100 mg total) by mouth daily. (Patient taking differently: Take 50 mg by mouth daily.) 90 tablet 3   metoprolol succinate (TOPROL-XL) 100  MG 24 hr tablet TAKE 1 AND 1/2 TABLETS EVERY DAY. DOSE INCREASE. 135 tablet 2   Multiple Vitamin (MULTIVITAMIN ADULT PO) Take 1 tablet by mouth daily.     Multiple Vitamins-Minerals (EQ VISION FORMULA 50+ PO) Take 1 tablet by mouth every morning.     nitroGLYCERIN (NITROSTAT) 0.4 MG SL tablet Place 1 tablet (0.4 mg total) under the tongue every 5 (five) minutes x 3 doses as needed for chest pain (if no relief after 3rd dose, proceed to the ED or call 911). 25 tablet  3   PAIN MANAGEMENT INTRATHECAL, IT, PUMP by Intrathecal route.     pregabalin (LYRICA) 50 MG capsule Take 50 mg by mouth 3 (three) times daily.     Spacer/Aero-Holding Chambers (AEROCHAMBER MV) inhaler Use as instructed (Patient not taking: Reported on 08/12/2022) 1 each 0   No current facility-administered medications for this visit.     PHYSICAL EXAMINATION: Performance status (ECOG): 4 - Bedbound  There were no vitals filed for this visit. Wt Readings from Last 3 Encounters:  05/09/22 205 lb (93 kg)  04/21/22 205 lb (93 kg)  03/25/22 192 lb 0.3 oz (87.1 kg)   Physical Exam Vitals reviewed.  Constitutional:      Appearance: Normal appearance.  Cardiovascular:     Rate and Rhythm: Normal rate and regular rhythm.     Pulses: Normal pulses.     Heart sounds: Normal heart sounds.  Pulmonary:     Effort: Pulmonary effort is normal.     Breath sounds: Normal breath sounds.  Neurological:     General: No focal deficit present.     Mental Status: She is alert and oriented to person, place, and time.  Psychiatric:        Mood and Affect: Mood normal.        Behavior: Behavior normal.    Breast Exam Chaperone: Thana Ates     LABORATORY DATA:  I have reviewed the data as listed    Latest Ref Rng & Units 08/12/2022   12:42 PM 07/15/2022   12:07 PM 06/09/2022   10:34 AM  CMP  Glucose 70 - 99 mg/dL 108  127  108   BUN 8 - 23 mg/dL _0 Creatinine 0.44 - 1.00 mg/dL 0.73  0.70  0.58   Sodium 135 - 145 mmol/L 141  142  140   Potassium 3.5 - 5.1 mmol/L 4.9  4.7  4.6   Chloride 98 - 111 mmol/L 104  106  105   CO2 22 - 32 mmol/L 32  30  31   Calcium 8.9 - 10.3 mg/dL 8.9  9.1  9.2   Total Protein 6.5 - 8.1 g/dL 5.9  5.7  6.5   Total Bilirubin 0.3 - 1.2 mg/dL 1.2  0.7  0.9   Alkaline Phos 38 - 126 U/L 38  34  40   AST 15 - 41 U/L _1 ALT 0 - 44 U/L _2 Lab Results  Component Value Date   CAN153 17.1 06/09/2022   CAN153 16.7 02/18/2022   CAN153  20.4 10/01/2021   Lab Results  Component Value Date   WBC 4.9 09/09/2022   HGB 11.1 (L) 09/09/2022   HCT 34.1 (L) 09/09/2022   MCV 104.3 (H) 09/09/2022   PLT 198 09/09/2022   NEUTROABS 3.3 09/09/2022    ASSESSMENT:  1.  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 -Ultrasound-guided biopsy on 01/07/2016 showed grade 1 invasive ductal carcinoma, ER positive, PR positive, HER-2 equivocal by FISH, right axillary lymph node with metastatic adenocarcinoma.  HER-2 equivocal since her 2/CEP17 ratio less than 2 and average copy number of her 2 is greater than or equal to 4 but less than 6 signals per cell. -Bone biopsy on 05/30/2016 consistent with metastatic breast cancer. -Palliative therapy with letrozole started under the direction of Dr. Deitra Mayo at [facility] from 02/25/2016 through February 2020, progressive symptoms with increased soft tissue extension at L2. 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 -Addition of Leslee Home was discussed but she was concerned about cost of the medication. -CT abdomen and pelvis on 08/13/2020 shows acute compression fracture deformity of the L1 vertebral body and other chronic findings. -Her last Faslodex injection at [facility] was on 11/06/2020. -CT CAP on 02/19/2021 with no evidence of for solid organ metastasis or nodal metastasis.  Right breast lesion is stable.  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 -Bone scan on 02/19/2021 was negative for bone mets.   2.  Social/family history: -She worked for [employer] prior to retirement in 2010. -She is currently living with her son who is helping with her day-to-day activities.  Daughter lives in [place].  She quit smoking 35 to 40 years ago. -She  is mostly confined to bed.  However she is able to change clothes, able to wash upper body, halfway dress herself.  She cannot stand up for the past 2 years because of weakness in the legs and knee arthritis.  She wears adult diapers.   PLAN:  1.  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 - Tumor markers on 06/09/2022 were normal.  CBC today was grossly normal. - Reviewed labs which show normal LFTs and creatinine.  CBC shows hemoglobin is 11.1.  Last tumor markers including CA 15-3 and CA 27-29 are normal. - Continue Faslodex monthly.  Plan to repeat CT CAP and bone scan in 3 months with repeat tumor markers.    2.  Cancer associated pain: - Continue oxycodone 10 mg 3-4 times daily for breakthrough pain. - She has morphine/bupivacaine intrathecal pump.  3.  Right lower quadrant abdominal pain: - She reports worsening pain in the lower back, right hip and right leg. - She is seeing [facility] pain specialist on 09/15/2022.  4.  Bone metastasis: - Calcium is normal today.  Continue denosumab monthly.  5.  Macrocytic anemia: - She is taking iron tablet daily.  Ferritin is 47, up from 36.  Hemoglobin is 11.1 and stable. - Continue iron tablet daily.  Breast Cancer therapy associated bone loss: I have recommended calcium, Vitamin D and weight bearing exercises.  Orders placed this encounter:  No orders of the defined types were placed in this encounter.   The patient has a good understanding of the overall plan. She agrees with it. She will call with any problems that may develop before the next visit here.  Derek Jack, MD [facility] [phone]

## 2022-09-09 NOTE — Patient Instructions (Addendum)
[facility] at [facility] Discharge Instructions   You were seen and examined today by Dr. Delton Coombes.  He reviewed the results of your lab work which are normal/stable. Tumor markers are pending.   We will repeat a CT scan prior to your next visit.  Return as scheduled.    Thank you for choosing [facility] at [facility] to provide your oncology and hematology care.  To afford each patient quality time with our provider, please arrive at least 15 minutes before your scheduled appointment time.   If you have a lab appointment with the [facility] please come in thru the Main Entrance and check in at the main information desk.  You need to re-schedule your appointment should you arrive 10 or more minutes late.  We strive to give you quality time with our providers, and arriving late affects you and other patients whose appointments are after yours.  Also, if you no show three or more times for appointments you may be dismissed from the clinic at the providers discretion.     Again, thank you for choosing [facility].  Our hope is that these requests will decrease the amount of time that you wait before being seen by our physicians.       _____________________________________________________________  Should you have questions after your visit to [facility], please contact our office at [phone] and follow the prompts.  Our office hours are 8:00 a.m. and 4:30 p.m. Monday - Friday.  Please note that voicemails left after 4:00 p.m. may not be returned until the following business day.  We are closed weekends and major holidays.  You do have access to a nurse 24-7, just call the main number to the clinic [phone] and do not press any options, hold on the line and a nurse will answer the phone.    For prescription refill requests, have your pharmacy contact our office and allow 72 hours.    Due to Covid, you will  need to wear a mask upon entering the hospital. If you do not have a mask, a mask will be given to you at the Main Entrance upon arrival. For doctor visits, patients may have 1 support person age 48 or older with them. For treatment visits, patients can not have anyone with them due to social distancing guidelines and our immunocompromised population.

## 2022-09-10 LAB — CANCER ANTIGEN 27.29: CA 27.29: 21.2 U/mL (ref 0.0–38.6)

## 2022-09-11 LAB — CANCER ANTIGEN 15-3: CA 15-3: 18 U/mL (ref 0.0–25.0)

## 2022-09-15 ENCOUNTER — Other Ambulatory Visit: Payer: Self-pay | Admitting: Cardiology

## 2022-10-06 ENCOUNTER — Other Ambulatory Visit: Payer: Self-pay

## 2022-10-06 DIAGNOSIS — C50411 Malignant neoplasm of upper-outer quadrant of right female breast: Secondary | ICD-10-CM

## 2022-10-07 ENCOUNTER — Inpatient Hospital Stay: Payer: Medicare Other

## 2022-10-07 ENCOUNTER — Inpatient Hospital Stay: Payer: Medicare Other | Attending: Hematology

## 2022-10-07 VITALS — BP 135/53 | HR 64 | Temp 97.2°F | Resp 18

## 2022-10-07 DIAGNOSIS — Z5111 Encounter for antineoplastic chemotherapy: Secondary | ICD-10-CM | POA: Diagnosis not present

## 2022-10-07 DIAGNOSIS — Z170 Estrogen receptor positive status [ER+]: Secondary | ICD-10-CM | POA: Diagnosis not present

## 2022-10-07 DIAGNOSIS — C7951 Secondary malignant neoplasm of bone: Secondary | ICD-10-CM | POA: Insufficient documentation

## 2022-10-07 DIAGNOSIS — C50911 Malignant neoplasm of unspecified site of right female breast: Secondary | ICD-10-CM | POA: Diagnosis present

## 2022-10-07 LAB — COMPREHENSIVE METABOLIC PANEL
ALT: 18 U/L (ref 0–44)
AST: 22 U/L (ref 15–41)
Albumin: 3.4 g/dL — ABNORMAL LOW (ref 3.5–5.0)
Alkaline Phosphatase: 34 U/L — ABNORMAL LOW (ref 38–126)
Anion gap: 6 (ref 5–15)
BUN: 21 mg/dL (ref 8–23)
CO2: 30 mmol/L (ref 22–32)
Calcium: 9.2 mg/dL (ref 8.9–10.3)
Chloride: 103 mmol/L (ref 98–111)
Creatinine, Ser: 0.73 mg/dL (ref 0.44–1.00)
GFR, Estimated: >60 mL/min (ref >60)
Glucose, Bld: 102 mg/dL — ABNORMAL HIGH (ref 70–99)
Potassium: 4.4 mmol/L (ref 3.5–5.1)
Sodium: 139 mmol/L (ref 135–145)
Total Bilirubin: 1 mg/dL (ref 0.3–1.2)
Total Protein: 5.9 g/dL — ABNORMAL LOW (ref 6.5–8.1)

## 2022-10-07 MED ORDER — FULVESTRANT 250 MG/5ML IM SOSY
500.0000 mg | PREFILLED_SYRINGE | Freq: Once | INTRAMUSCULAR | Status: AC
  Administered 2022-10-07: 500 mg via INTRAMUSCULAR
  Filled 2022-10-07: qty 10

## 2022-10-07 MED ORDER — DENOSUMAB 120 MG/1.7ML ~~LOC~~ SOLN
120.0000 mg | Freq: Once | SUBCUTANEOUS | Status: AC
  Administered 2022-10-07: 120 mg via SUBCUTANEOUS
  Filled 2022-10-07: qty 1.7

## 2022-10-07 NOTE — Progress Notes (Signed)
Stacey [name] presents today for injection per the provider's orders.  Faslodex and Xgeva 120 mg administration without incident; injection site WNL; see MAR for injection details.  Patient tolerated procedure well and without incident.  No questions or complaints noted at this time. Pt's Calcium is 9.2 today.  Pt denies tooth or jaw pain. No recent or future dental appointments. Pt reports taking Calcium and Vit D supplements as directed. Discharged from clinic via stretcher by  [facility] in stable condition. Alert and oriented x 3. F/U with [facility] as scheduled.

## 2022-10-07 NOTE — Patient Instructions (Signed)
[facility]  Discharge Instructions: Thank you for choosing [facility] to provide your oncology and hematology care.  If you have a lab appointment with the [facility], please come in thru the Main Entrance and check in at the main information desk.  Wear comfortable clothing and clothing appropriate for easy access to any Portacath or PICC line.   We strive to give you quality time with your provider. You may need to reschedule your appointment if you arrive late (15 or more minutes).  Arriving late affects you and other patients whose appointments are after yours.  Also, if you miss three or more appointments without notifying the office, you may be dismissed from the clinic at the provider's discretion.      For prescription refill requests, have your pharmacy contact our office and allow 72 hours for refills to be completed.    Today you received Xgeva and Faslodex     BELOW ARE SYMPTOMS THAT SHOULD BE REPORTED IMMEDIATELY: *FEVER GREATER THAN 100.4 F (38 C) OR HIGHER *CHILLS OR SWEATING *NAUSEA AND VOMITING THAT IS NOT CONTROLLED WITH YOUR NAUSEA MEDICATION *UNUSUAL SHORTNESS OF BREATH *UNUSUAL BRUISING OR BLEEDING *URINARY PROBLEMS (pain or burning when urinating, or frequent urination) *BOWEL PROBLEMS (unusual diarrhea, constipation, pain near the anus) TENDERNESS IN MOUTH AND THROAT WITH OR WITHOUT PRESENCE OF ULCERS (sore throat, sores in mouth, or a toothache) UNUSUAL RASH, SWELLING OR PAIN  UNUSUAL VAGINAL DISCHARGE OR ITCHING   Items with * indicate a potential emergency and should be followed up as soon as possible or go to the Emergency Department if any problems should occur.  Please show the CHEMOTHERAPY ALERT CARD or IMMUNOTHERAPY ALERT CARD at check-in to the Emergency Department and triage nurse.  Should you have questions after your visit or need to cancel or reschedule your appointment, please contact [facility] [phone]  and follow the prompts.  Office hours are 8:00 a.m. to 4:30 p.m. Monday - Friday. Please note that voicemails left after 4:00 p.m. may not be returned until the following business day.  We are closed weekends and major holidays. You have access to a nurse at all times for urgent questions. Please call the main number to the clinic [phone] and follow the prompts.  For any non-urgent questions, you may also contact your provider using MyChart. We now offer e-Visits for anyone 50 and older to request care online for non-urgent symptoms. For details visit [email or web address].   Also download the MyChart app! Go to the app store, search "MyChart", open the app, select [facility], and log in with your MyChart username and password.  Masks are optional in the cancer centers. If you would like for your care team to wear a mask while they are taking care of you, please let them know. You may have one support person who is at least 75 years old accompany you for your appointments.

## 2022-10-30 ENCOUNTER — Encounter: Payer: Self-pay | Admitting: Cardiology

## 2022-10-30 ENCOUNTER — Ambulatory Visit: Payer: Medicare Other | Attending: Cardiology | Admitting: Cardiology

## 2022-10-30 VITALS — Ht 60.0 in

## 2022-10-30 DIAGNOSIS — I350 Nonrheumatic aortic (valve) stenosis: Secondary | ICD-10-CM

## 2022-10-30 DIAGNOSIS — I5022 Chronic systolic (congestive) heart failure: Secondary | ICD-10-CM

## 2022-10-30 DIAGNOSIS — I480 Paroxysmal atrial fibrillation: Secondary | ICD-10-CM | POA: Diagnosis not present

## 2022-10-30 DIAGNOSIS — I2510 Atherosclerotic heart disease of native coronary artery without angina pectoris: Secondary | ICD-10-CM

## 2022-10-30 NOTE — Patient Instructions (Signed)
Medication Instructions:  Continue all current medications.   Labwork: none  Testing/Procedures: none  Follow-Up: 6 months   Any Other Special Instructions Will Be Listed Below (If Applicable).   If you need a refill on your cardiac medications before your next appointment, please call your pharmacy.  

## 2022-10-30 NOTE — Progress Notes (Signed)
Virtual Visit via Telephone Note   Because of [place] B Tingler's co-morbid illnesses, she is at least at moderate risk for complications without adequate follow up.  This format is felt to be most appropriate for this patient at this time.  The patient did not have access to video technology/had technical difficulties with video requiring transitioning to audio format only (telephone).  All issues noted in this document were discussed and addressed.  No physical exam could be performed with this format.  Please refer to the patient's chart for her consent to telehealth for [facility].    Date:  10/30/2022   ID:  [name], DOB Feb 18, 1946, MRN [id number] The patient was identified using 2 identifiers.  Patient Location: Home Provider Location: Office/Clinic   PCP:  Glenda Chroman, MD   [place] [facility] Cardiologist:  Carlyle Dolly, MD     Evaluation Performed:  Follow-Up Visit  Chief Complaint:  Follow up visit  History of Present Illness:    [name] is a 76 y.o. female seen today for follow up of the following medical problems.      1. Chronic systolic HF/ICM - Jan 0626 echo LVEF 20-25%, restrictive diastolic function, mild AS, PASP 40 - 10/2017 echo LVEF 50%, dropped to 30-35% Jan 2019 and has remained decreased   - chronic left leg edema thought to be venous insufficiency   From prior cardiologist: "She is not a candidate for device therapy given her metastatic cancer and numerous comorbidities and poor long-term prognosis. She has been scheduled to establish care with the palliative care team which I think is quite appropriate".  - has been hesitant to change to entresto due to concern about cost      03/2020 LVEF 35-40%   03/2021 echo LVEF 50-55%, grade I dd - no recent edema, no SOB/DOE  - compliant with meds       2. CAD - notes incidate history of DES to ramus, late stent thrombosis requiring PTCA in 2006. DES to  RCA in Dec 2018 - SOB on brillinta, previously changed to plavix.    -no chest pains       3. Mild aortic stenosis - Jan 2020 echo mean grad 11, AVA VTI 1.69 - 2021 mild to mod AS   03/2021 echo mild moderate AS: mean grad 10.5, DI 0.31, AVA VTI 1.09   4. Chronic LBBB     5. . Metastatic breast cancer/Chronic pain - followed at [facility]. Metastatic breast cancer to the bone and renal lesion, has pulmonary nodules. On radiation. - has been seen by palliative care previously - from 03/08/20 onc note increased size of renal lesion, to f/u with urology. Mention concern for additional primary such as renal cell CA.   - recent leg weakness, progressing back pain. Had PET scan and MRI at [facility] within the last few weeks, she is waiting to hear back from neurosurgery     6. Afib - off coumadin for intrathecal pain pump through palliative pain management at [facility]   -no significant palpitations      7. Recent lap chole 03/2021 - later required incarcerated umbilical hernia repair [id number]/4853     8. HTN - compliant with meds - home bps 130s/70s  Past Medical History:  Diagnosis Date   CAD in native artery    a. DES to ramus 2005 with late stent thrombosis 2006 tx with PTCA. 12/18 PCI/DES x1 to mRCA, EF 50-55%   CHF (congestive  heart failure) (HCC)    Chronic pain    COPD (chronic obstructive pulmonary disease) (HCC)    Hyperlipidemia    Hypertension    Left bundle Lillyian Heidt block    Lymphedema    Metastatic breast cancer    a. to bone.   MI (myocardial infarction) ([place])    Mild aortic stenosis 10/2017   Morbid obesity ([place])    PAF (paroxysmal atrial fibrillation) ([place])    PSVT (paroxysmal supraventricular tachycardia) ([place])    a. per [facility] notes, seen on event monitor in 2014.   Pulmonary nodules    Past Surgical History:  Procedure Laterality Date   CHOLECYSTECTOMY N/A 03/29/2021   Procedure: LAPAROSCOPIC CHOLECYSTECTOMY;  Surgeon: [name], MD;  Location: [facility];  Service:  General;  Laterality: N/A;   CORONARY STENT INTERVENTION N/A 11/26/2017   Procedure: CORONARY STENT INTERVENTION;  Surgeon: Martinique, Peter M, MD;  Location: [place] CV LAB;  Service: Cardiovascular;  Laterality: N/A;   CORONARY STENT PLACEMENT     ERCP N/A 03/28/2021   Procedure: ENDOSCOPIC RETROGRADE CHOLANGIOPANCREATOGRAPHY (ERCP);  Surgeon: Rogene Houston, MD;  Location: [facility] ORS;  Service: Endoscopy;  Laterality: N/A;   intrathecal pain pump     LEFT HEART CATH AND CORONARY ANGIOGRAPHY N/A 11/26/2017   Procedure: LEFT HEART CATH AND CORONARY ANGIOGRAPHY;  Surgeon: Martinique, Peter M, MD;  Location: [place] CV LAB;  Service: Cardiovascular;  Laterality: N/A;   REMOVAL OF STONES  03/28/2021   Procedure: REMOVAL OF STONES;  Surgeon: Rogene Houston, MD;  Location: [facility];  Service: Endoscopy;;   SPHINCTEROTOMY  03/28/2021   Procedure: SPHINCTEROTOMY;  Surgeon: Rogene Houston, MD;  Location: [facility] OR[facility];  Service: Endoscopy;;   UMBILICAL HERNIA REPAIR N/A 04/03/2021   Procedure: ADULT PRIMARY UMBILICAL HERNIA REPAIR;  Surgeon: [name], MD;  Location: [facility];  Service: General;  Laterality: N/A;   VASCULAR SURGERY       No outpatient medications have been marked as taking for the 10/30/22 encounter (Appointment) with Arnoldo Lenis, MD.     Allergies:   Brilinta [ticagrelor], Budesonide, and Albuterol   Social History   Tobacco Use   Smoking status: Former    Packs/day: 2.00    Years: 18.00    Total pack years: 36.00    Types: Cigarettes    Quit date: 12/01/1980    Years since quitting: 41.9   Smokeless tobacco: Never  Vaping Use   Vaping Use: Never used  Substance Use Topics   Alcohol use: No   Drug use: Yes    Types: Oxycodone, Morphine    Comment: takes methadone     Family Hx: The patient's family history includes CAD in her paternal grandmother; CAD (age of onset: 40) in her father; COPD in her sister; Heart attack in her father. There is no history of Sudden  Cardiac Death, Colon polyps, or Colon cancer.  ROS:   Please see the history of present illness.     All other systems reviewed and are negative.   Prior CV studies:   The following studies were reviewed today:    Labs/Other Tests and Data Reviewed:    EKG:  No ECG reviewed.  Recent Labs: 05/09/2022: B Natriuretic Peptide 77.0 09/09/2022: Hemoglobin 11.1; Magnesium 2.1; Platelets 198 10/07/2022: ALT 18; BUN 21; Creatinine, Ser 0.73; Potassium 4.4; Sodium 139   Recent Lipid Panel No results found for: "CHOL", "TRIG", "HDL", "CHOLHDL", "LDLCALC", "LDLDIRECT"  Wt Readings from Last 3 Encounters:  05/09/22 205 lb (93 kg)  04/21/22 205 lb (93 kg)  03/25/22 192 lb 0.3 oz (87.1 kg)     Risk Assessment/Calculations:          Objective:    Vital Signs:   Today's Vitals   10/30/22 1344  Height: 5' (1.524 m)   Body mass index is 40.04 kg/m. Normal affect. Normal speech pattern and tone. Comfortable, no apparent distress. No audible signs of sob or wheezing.   ASSESSMENT & PLAN:    1. HFimpEF - LVEF has normalized - no symptoms, continue current meds   2. CAD - on extended DAPT due to history of late stent thrombosis - no recent symptoms, continue current meds   3. PAF -off coumadin since intrathecal pain pump placement at [facility], contraindicated with pump in place - no symptoms, conitnue current meds   4. HTN - at goal, continue current meds  5. Aortic stenosis - due for repeat echo next year. Will see how overlal health status is in the spring/summer before ordering.                Time:   Today, I have spent 26 minutes with the patient with telehealth technology discussing the above problems.     Medication Adjustments/Labs and Tests Ordered: Current medicines are reviewed at length with the patient today.  Concerns regarding medicines are outlined above.   Tests Ordered: No orders of the defined types were placed in this encounter.   Medication  Changes: No orders of the defined types were placed in this encounter.   Follow Up:  Person or virtual 6 months  Signed, Carlyle Dolly, MD  10/30/2022 12:32 PM    [place]

## 2022-11-03 ENCOUNTER — Other Ambulatory Visit: Payer: Self-pay

## 2022-11-03 DIAGNOSIS — C50911 Malignant neoplasm of unspecified site of right female breast: Secondary | ICD-10-CM

## 2022-11-04 ENCOUNTER — Inpatient Hospital Stay: Payer: Medicare Other

## 2022-11-04 ENCOUNTER — Inpatient Hospital Stay: Payer: Medicare Other | Attending: Hematology

## 2022-11-04 VITALS — BP 145/44 | HR 62 | Temp 98.0°F | Resp 18

## 2022-11-04 DIAGNOSIS — C50911 Malignant neoplasm of unspecified site of right female breast: Secondary | ICD-10-CM | POA: Diagnosis present

## 2022-11-04 DIAGNOSIS — Z5111 Encounter for antineoplastic chemotherapy: Secondary | ICD-10-CM | POA: Diagnosis present

## 2022-11-04 DIAGNOSIS — C7951 Secondary malignant neoplasm of bone: Secondary | ICD-10-CM | POA: Insufficient documentation

## 2022-11-04 LAB — COMPREHENSIVE METABOLIC PANEL
ALT: 16 U/L (ref 0–44)
AST: 22 U/L (ref 15–41)
Albumin: 3.3 g/dL — ABNORMAL LOW (ref 3.5–5.0)
Alkaline Phosphatase: 37 U/L — ABNORMAL LOW (ref 38–126)
Anion gap: 6 (ref 5–15)
BUN: 19 mg/dL (ref 8–23)
CO2: 31 mmol/L (ref 22–32)
Calcium: 9 mg/dL (ref 8.9–10.3)
Chloride: 103 mmol/L (ref 98–111)
Creatinine, Ser: 0.63 mg/dL (ref 0.44–1.00)
GFR, Estimated: >60 mL/min (ref >60)
Glucose, Bld: 111 mg/dL — ABNORMAL HIGH (ref 70–99)
Potassium: 5 mmol/L (ref 3.5–5.1)
Sodium: 140 mmol/L (ref 135–145)
Total Bilirubin: 1.1 mg/dL (ref 0.3–1.2)
Total Protein: 5.9 g/dL — ABNORMAL LOW (ref 6.5–8.1)

## 2022-11-04 MED ORDER — FULVESTRANT 250 MG/5ML IM SOSY
500.0000 mg | PREFILLED_SYRINGE | Freq: Once | INTRAMUSCULAR | Status: AC
  Administered 2022-11-04: 500 mg via INTRAMUSCULAR
  Filled 2022-11-04: qty 10

## 2022-11-04 MED ORDER — DENOSUMAB 120 MG/1.7ML ~~LOC~~ SOLN
120.0000 mg | Freq: Once | SUBCUTANEOUS | Status: AC
  Administered 2022-11-04: 120 mg via SUBCUTANEOUS
  Filled 2022-11-04: qty 1.7

## 2022-11-04 NOTE — Patient Instructions (Signed)
[facility]  Discharge Instructions: Thank you for choosing [facility] to provide your oncology and hematology care.  If you have a lab appointment with the [facility], please come in thru the Main Entrance and check in at the main information desk.  Wear comfortable clothing and clothing appropriate for easy access to any Portacath or PICC line.   We strive to give you quality time with your provider. You may need to reschedule your appointment if you arrive late (15 or more minutes).  Arriving late affects you and other patients whose appointments are after yours.  Also, if you miss three or more appointments without notifying the office, you may be dismissed from the clinic at the provider's discretion.      For prescription refill requests, have your pharmacy contact our office and allow 72 hours for refills to be completed.    Today you received the following chemotherapy and/or immunotherapy agents Faslodex and Xgeva injection. Denosumab Injection (Oncology) What is this medication? DENOSUMAB (den oh SUE mab) prevents weakened bones caused by cancer. It may also be used to treat noncancerous bone tumors that cannot be removed by surgery. It can also be used to treat high calcium levels in the blood caused by cancer. It works by blocking a protein that causes bones to break down quickly. This slows down the release of calcium from bones, which lowers calcium levels in your blood. It also makes your bones stronger and less likely to break (fracture). This medicine may be used for other purposes; ask your health care provider or pharmacist if you have questions. COMMON BRAND NAME(S): XGEVA What should I tell my care team before I take this medication? They need to know if you have any of these conditions: Dental disease Having surgery or tooth extraction Infection Kidney disease Low levels of calcium or vitamin D in the blood Malnutrition On  hemodialysis Skin conditions or sensitivity Thyroid or parathyroid disease An unusual reaction to denosumab, other medications, foods, dyes, or preservatives Pregnant or trying to get pregnant Breast-feeding How should I use this medication? This medication is for injection under the skin. It is given by your care team in a hospital or clinic setting. A special MedGuide will be given to you before each treatment. Be sure to read this information carefully each time. Talk to your care team about the use of this medication in children. While it may be prescribed for children as young as 13 years for selected conditions, precautions do apply. Overdosage: If you think you have taken too much of this medicine contact a poison control center or emergency room at once. NOTE: This medicine is only for you. Do not share this medicine with others. What if I miss a dose? Keep appointments for follow-up doses. It is important not to miss your dose. Call your care team if you are unable to keep an appointment. What may interact with this medication? Do not take this medication with any of the following: Other medications containing denosumab This medication may also interact with the following: Medications that lower your chance of fighting infection Steroid medications, such as prednisone or cortisone This list may not describe all possible interactions. Give your health care provider a list of all the medicines, herbs, non-prescription drugs, or dietary supplements you use. Also tell them if you smoke, drink alcohol, or use illegal drugs. Some items may interact with your medicine. What should I watch for while using this medication? Your  condition will be monitored carefully while you are receiving this medication. You may need blood work while taking this medication. This medication may increase your risk of getting an infection. Call your care team for advice if you get a fever, chills, sore throat,  or other symptoms of a cold or flu. Do not treat yourself. Try to avoid being around people who are sick. You should make sure you get enough calcium and vitamin D while you are taking this medication, unless your care team tells you not to. Discuss the foods you eat and the vitamins you take with your care team. Some people who take this medication have severe bone, joint, or muscle pain. This medication may also increase your risk for jaw problems or a broken thigh bone. Tell your care team right away if you have severe pain in your jaw, bones, joints, or muscles. Tell your care team if you have any pain that does not go away or that gets worse. Talk to your care team if you may be pregnant. Serious birth defects can occur if you take this medication during pregnancy and for 5 months after the last dose. You will need a negative pregnancy test before starting this medication. Contraception is recommended while taking this medication and for 5 months after the last dose. Your care team can help you find the option that works for you. What side effects may I notice from receiving this medication? Side effects that you should report to your care team as soon as possible: Allergic reactions--skin rash, itching, hives, swelling of the face, lips, tongue, or throat Bone, joint, or muscle pain Low calcium level--muscle pain or cramps, confusion, tingling, or numbness in the hands or feet Osteonecrosis of the jaw--pain, swelling, or redness in the mouth, numbness of the jaw, poor healing after dental work, unusual discharge from the mouth, visible bones in the mouth Side effects that usually do not require medical attention (report to your care team if they continue or are bothersome): Cough Diarrhea Fatigue Headache Nausea This list may not describe all possible side effects. Call your doctor for medical advice about side effects. You may report side effects to FDA at 1-800-FDA-1088. Where should I keep  my medication? This medication is given in a hospital or clinic. It will not be stored at home. NOTE: This sheet is a summary. It may not cover all possible information. If you have questions about this medicine, talk to your doctor, pharmacist, or health care provider.  2023 Elsevier/Gold Standard (2022-04-07 00:00:00) Fulvestrant Injection What is this medication? FULVESTRANT (ful VES trant) treats breast cancer. It works by blocking the hormone estrogen in breast tissue, which prevents breast cancer cells from spreading or growing. This medicine may be used for other purposes; ask your health care provider or pharmacist if you have questions. COMMON BRAND NAME(S): FASLODEX What should I tell my care team before I take this medication? They need to know if you have any of these conditions: Bleeding disorder Liver disease Low blood cell levels, such as low white cells, red cells, and platelets An unusual or allergic reaction to fulvestrant, other medications, foods, dyes, or preservatives Pregnant or trying to get pregnant Breast-feeding How should I use this medication? This medication is injected into a muscle. It is given by your care team in a hospital or clinic setting. Talk to your care team about the use of this medication in children. Special care may be needed. Overdosage: If you think you have taken  too much of this medicine contact a poison control center or emergency room at once. NOTE: This medicine is only for you. Do not share this medicine with others. What if I miss a dose? Keep appointments for follow-up doses. It is important not to miss your dose. Call your care team if you are unable to keep an appointment. What may interact with this medication? Certain medications that prevent or treat blood clots, such as warfarin, enoxaparin, dalteparin, apixaban, dabigatran, rivaroxaban This list may not describe all possible interactions. Give your health care provider a list of  all the medicines, herbs, non-prescription drugs, or dietary supplements you use. Also tell them if you smoke, drink alcohol, or use illegal drugs. Some items may interact with your medicine. What should I watch for while using this medication? Your condition will be monitored carefully while you are receiving this medication. You may need blood work while taking this medication. Talk to your care team if you may be pregnant. Serious birth defects can occur if you take this medication during pregnancy and for 1 year after the last dose. You will need a negative pregnancy test before starting this medication. Contraception is recommended while taking this medication and for 1 year after the last dose. Your care team can help you find the option that works for you. Do not breastfeed while taking this medication and for 1 year after the last dose. This medication may cause infertility. Talk to your care team if you are concerned about your fertility. What side effects may I notice from receiving this medication? Side effects that you should report to your care team as soon as possible: Allergic reactions or angioedema--skin rash, itching or hives, swelling of the face, eyes, lips, tongue, arms, or legs, trouble swallowing or breathing Pain, tingling, or numbness in the hands or feet Side effects that usually do not require medical attention (report to your care team if they continue or are bothersome): Bone, joint, or muscle pain Constipation Headache Hot flashes Nausea Pain, redness, or irritation at injection site Unusual weakness or fatigue This list may not describe all possible side effects. Call your doctor for medical advice about side effects. You may report side effects to FDA at 1-800-FDA-1088. Where should I keep my medication? This medication is given in a hospital or clinic. It will not be stored at home. NOTE: This sheet is a summary. It may not cover all possible information. If you  have questions about this medicine, talk to your doctor, pharmacist, or health care provider.  2023 Elsevier/Gold Standard (2022-03-31 00:00:00)       To help prevent nausea and vomiting after your treatment, we encourage you to take your nausea medication as directed.  BELOW ARE SYMPTOMS THAT SHOULD BE REPORTED IMMEDIATELY: *FEVER GREATER THAN 100.4 F (38 C) OR HIGHER *CHILLS OR SWEATING *NAUSEA AND VOMITING THAT IS NOT CONTROLLED WITH YOUR NAUSEA MEDICATION *UNUSUAL SHORTNESS OF BREATH *UNUSUAL BRUISING OR BLEEDING *URINARY PROBLEMS (pain or burning when urinating, or frequent urination) *BOWEL PROBLEMS (unusual diarrhea, constipation, pain near the anus) TENDERNESS IN MOUTH AND THROAT WITH OR WITHOUT PRESENCE OF ULCERS (sore throat, sores in mouth, or a toothache) UNUSUAL RASH, SWELLING OR PAIN  UNUSUAL VAGINAL DISCHARGE OR ITCHING   Items with * indicate a potential emergency and should be followed up as soon as possible or go to the Emergency Department if any problems should occur.  Please show the CHEMOTHERAPY ALERT CARD or IMMUNOTHERAPY ALERT CARD at check-in to the Emergency  Department and triage nurse.  Should you have questions after your visit or need to cancel or reschedule your appointment, please contact [facility] [phone]  and follow the prompts.  Office hours are 8:00 a.m. to 4:30 p.m. Monday - Friday. Please note that voicemails left after 4:00 p.m. may not be returned until the following business day.  We are closed weekends and major holidays. You have access to a nurse at all times for urgent questions. Please call the main number to the clinic [phone] and follow the prompts.  For any non-urgent questions, you may also contact your provider using MyChart. We now offer e-Visits for anyone 50 and older to request care online for non-urgent symptoms. For details visit [email or web address].   Also download the MyChart app! Go to the app  store, search "MyChart", open the app, select [facility], and log in with your MyChart username and password.  Masks are optional in the cancer centers. If you would like for your care team to wear a mask while they are taking care of you, please let them know. You may have one support person who is at least 75 years old accompany you for your appointments.

## 2022-11-04 NOTE — Progress Notes (Signed)
Patient presents today for Faslodex and Xgeva injection. Patient transported by [facility]. Pharmacist, community and given to [employer] , [address]. Labs pending.

## 2022-11-04 NOTE — Progress Notes (Signed)
Stacey [name] presents today for injection per the provider's orders. Calcium 9.0. Patient takes a multivitamin daily. Faslodex and Xgeva administration without incident; injection site WNL; see MAR for injection details.  Patient tolerated procedure well and without incident. No complaints at this time. Discharged from clinic by stretcher transported by [facility] in stable condition. Alert and oriented x 3. F/U with [facility] as scheduled.

## 2022-11-26 ENCOUNTER — Encounter (HOSPITAL_COMMUNITY)
Admission: RE | Admit: 2022-11-26 | Discharge: 2022-11-26 | Disposition: A | Discharge status: Home / Self Care | Payer: Medicare Other | Source: Ambulatory Visit | Attending: Hematology | Admitting: Hematology

## 2022-11-26 ENCOUNTER — Ambulatory Visit (HOSPITAL_COMMUNITY)
Admission: RE | Admit: 2022-11-26 | Discharge: 2022-11-26 | Disposition: A | Discharge status: Home / Self Care | Payer: Medicare Other | Source: Ambulatory Visit | Attending: Hematology | Admitting: Hematology

## 2022-11-26 ENCOUNTER — Encounter (HOSPITAL_COMMUNITY): Payer: Self-pay

## 2022-11-26 DIAGNOSIS — C50911 Malignant neoplasm of unspecified site of right female breast: Secondary | ICD-10-CM | POA: Diagnosis present

## 2022-11-26 DIAGNOSIS — Z170 Estrogen receptor positive status [ER+]: Secondary | ICD-10-CM | POA: Insufficient documentation

## 2022-11-26 MED ORDER — IOHEXOL 300 MG/ML  SOLN
100.0000 mL | Freq: Once | INTRAMUSCULAR | Status: AC | PRN
  Administered 2022-11-26: 100 mL via INTRAVENOUS

## 2022-11-26 MED ORDER — TECHNETIUM TC 99M MEDRONATE IV KIT
20.0000 | PACK | Freq: Once | INTRAVENOUS | Status: AC | PRN
  Administered 2022-11-26: 21 via INTRAVENOUS

## 2022-11-26 MED ORDER — IOHEXOL 9 MG/ML PO SOLN
ORAL | Status: AC
  Filled 2022-11-26: qty 1000

## 2022-12-02 ENCOUNTER — Inpatient Hospital Stay: Payer: Medicare Other | Admitting: Hematology

## 2022-12-02 ENCOUNTER — Inpatient Hospital Stay: Payer: Medicare Other

## 2022-12-05 ENCOUNTER — Other Ambulatory Visit: Payer: Self-pay

## 2022-12-05 DIAGNOSIS — C50911 Malignant neoplasm of unspecified site of right female breast: Secondary | ICD-10-CM

## 2022-12-08 ENCOUNTER — Inpatient Hospital Stay (HOSPITAL_BASED_OUTPATIENT_CLINIC_OR_DEPARTMENT_OTHER): Payer: Medicare Other | Admitting: Hematology

## 2022-12-08 ENCOUNTER — Inpatient Hospital Stay: Payer: Medicare Other

## 2022-12-08 ENCOUNTER — Inpatient Hospital Stay: Payer: Medicare Other | Attending: Hematology

## 2022-12-08 DIAGNOSIS — Z170 Estrogen receptor positive status [ER+]: Secondary | ICD-10-CM | POA: Diagnosis not present

## 2022-12-08 DIAGNOSIS — Z5111 Encounter for antineoplastic chemotherapy: Secondary | ICD-10-CM | POA: Insufficient documentation

## 2022-12-08 DIAGNOSIS — C7951 Secondary malignant neoplasm of bone: Secondary | ICD-10-CM | POA: Insufficient documentation

## 2022-12-08 DIAGNOSIS — Z79899 Other long term (current) drug therapy: Secondary | ICD-10-CM | POA: Insufficient documentation

## 2022-12-08 DIAGNOSIS — C50911 Malignant neoplasm of unspecified site of right female breast: Secondary | ICD-10-CM | POA: Insufficient documentation

## 2022-12-08 LAB — CBC WITH DIFFERENTIAL/PLATELET
Abs Immature Granulocytes: 0.01 10*3/uL (ref 0.00–0.07)
Basophils Absolute: 0 10*3/uL (ref 0.0–0.1)
Basophils Relative: 1 %
Eosinophils Absolute: 0.1 10*3/uL (ref 0.0–0.5)
Eosinophils Relative: 3 %
HCT: 34.5 % — ABNORMAL LOW (ref 36.0–46.0)
Hemoglobin: 11.3 g/dL — ABNORMAL LOW (ref 12.0–15.0)
Immature Granulocytes: 0 %
Lymphocytes Relative: 18 %
Lymphs Abs: 0.9 10*3/uL (ref 0.7–4.0)
MCH: 34 pg (ref 26.0–34.0)
MCHC: 32.8 g/dL (ref 30.0–36.0)
MCV: 103.9 fL — ABNORMAL HIGH (ref 80.0–100.0)
Monocytes Absolute: 0.3 10*3/uL (ref 0.1–1.0)
Monocytes Relative: 6 %
Neutro Abs: 3.4 10*3/uL (ref 1.7–7.7)
Neutrophils Relative %: 72 %
Platelets: 202 10*3/uL (ref 150–400)
RBC: 3.32 MIL/uL — ABNORMAL LOW (ref 3.87–5.11)
RDW: 13.2 % (ref 11.5–15.5)
WBC: 4.8 10*3/uL (ref 4.0–10.5)
nRBC: 0 % (ref 0.0–0.2)

## 2022-12-08 LAB — COMPREHENSIVE METABOLIC PANEL
ALT: 15 U/L (ref 0–44)
AST: 20 U/L (ref 15–41)
Albumin: 3.4 g/dL — ABNORMAL LOW (ref 3.5–5.0)
Alkaline Phosphatase: 42 U/L (ref 38–126)
Anion gap: 6 (ref 5–15)
BUN: 17 mg/dL (ref 8–23)
CO2: 31 mmol/L (ref 22–32)
Calcium: 8.7 mg/dL — ABNORMAL LOW (ref 8.9–10.3)
Chloride: 102 mmol/L (ref 98–111)
Creatinine, Ser: 0.63 mg/dL (ref 0.44–1.00)
GFR, Estimated: >60 mL/min (ref >60)
Glucose, Bld: 97 mg/dL (ref 70–99)
Potassium: 3.7 mmol/L (ref 3.5–5.1)
Sodium: 139 mmol/L (ref 135–145)
Total Bilirubin: 0.9 mg/dL (ref 0.3–1.2)
Total Protein: 6.1 g/dL — ABNORMAL LOW (ref 6.5–8.1)

## 2022-12-08 LAB — MAGNESIUM: Magnesium: 1.8 mg/dL (ref 1.7–2.4)

## 2022-12-08 MED ORDER — FULVESTRANT 250 MG/5ML IM SOSY
500.0000 mg | PREFILLED_SYRINGE | Freq: Once | INTRAMUSCULAR | Status: AC
  Administered 2022-12-08: 500 mg via INTRAMUSCULAR
  Filled 2022-12-08: qty 10

## 2022-12-08 MED ORDER — DENOSUMAB 120 MG/1.7ML ~~LOC~~ SOLN
120.0000 mg | Freq: Once | SUBCUTANEOUS | Status: AC
  Administered 2022-12-08: 120 mg via SUBCUTANEOUS
  Filled 2022-12-08: qty 1.7

## 2022-12-08 NOTE — Progress Notes (Signed)
Patient tolerated Faslodex injection with no complaints voiced. Bilateral sites clean and dry with no bruising or swelling noted. Band aids applied. See MAR for details. Patient stable during and after injections. Patient taking calcium as directed. Denied tooth, jaw, and leg pain. No recent or upcoming dental visits. Labs reviewed. Patient tolerated injection with no complaints voiced. See MAR for details. Patient stable during and after injection. Site clean and dry with no bruising or swelling noted. Band aid applied. Vss with discharge and left in satisfactory condition with no s/s of distress.

## 2022-12-08 NOTE — Patient Instructions (Addendum)
[place] at [facility] Discharge Instructions   You were seen and examined today by Dr. Delton Coombes.  He reviewed the results of your scans which is stable. The spot on the kidney has grown by 5 mm. You reported that [facility] is keeping an eye on this.   We will give your Faslodex and Xgeva injections today and every 4 weeks.   We will see you back in 3 months. We will repeat lab work at that time.    Thank you for choosing [place] at [facility] to provide your oncology and hematology care.  To afford each patient quality time with our provider, please arrive at least 15 minutes before your scheduled appointment time.   If you have a lab appointment with the [facility] please come in thru the Main Entrance and check in at the main information desk.  You need to re-schedule your appointment should you arrive 10 or more minutes late.  We strive to give you quality time with our providers, and arriving late affects you and other patients whose appointments are after yours.  Also, if you no show three or more times for appointments you may be dismissed from the clinic at the providers discretion.     Again, thank you for choosing [facility].  Our hope is that these requests will decrease the amount of time that you wait before being seen by our physicians.       _____________________________________________________________  Should you have questions after your visit to [facility], please contact our office at [phone] and follow the prompts.  Our office hours are 8:00 a.m. and 4:30 p.m. Monday - Friday.  Please note that voicemails left after 4:00 p.m. may not be returned until the following business day.  We are closed weekends and major holidays.  You do have access to a nurse 24-7, just call the main number to the clinic [phone] and do not press any options, hold on the line and a nurse will answer the  phone.    For prescription refill requests, have your pharmacy contact our office and allow 72 hours.    Due to Covid, you will need to wear a mask upon entering the hospital. If you do not have a mask, a mask will be given to you at the Main Entrance upon arrival. For doctor visits, patients may have 1 support person age 21 or older with them. For treatment visits, patients can not have anyone with them due to social distancing guidelines and our immunocompromised population.

## 2022-12-08 NOTE — Progress Notes (Signed)
[facility] [address], [place] [address]   Patient Care Team: Glenda Chroman, MD as PCP - General (Internal Medicine) Harl Bowie Alphonse Guild, MD as PCP - Cardiology (Cardiology) Brien Mates, RN as Oncology Nurse Navigator (Oncology)  SUMMARY OF ONCOLOGIC HISTORY: Oncology History  Metastatic breast cancer  11/23/2017 Initial Diagnosis   Metastatic breast cancer ([facility])   02/07/2021 Cancer Staging   Staging form: Breast, AJCC 8th Edition - Clinical stage from 02/07/2021: Stage IV (cT2, cN1, pM1, G1, ER+, PR+, HER2: Equivocal) - Signed by Derek Jack, MD on 02/07/2021 Histopathologic type: Infiltrating duct carcinoma, NOS Stage prefix: Initial diagnosis Histologic grading system: 3 grade system   02/07/2021 - 02/07/2021 Chemotherapy           CHIEF COMPLIANT: Follow-up of 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 is a 77 y.o. 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  She appears to be comfortable from her pain as she has taken oxycodone.  Pain is usually in the back and on the right lateral ribs.  Chronic constipation is also stable.  Reports energy levels are 50%.  REVIEW OF SYSTEMS:   Review of Systems  Gastrointestinal:  Positive for constipation.  Musculoskeletal:  Positive for arthralgias (9/10 R knee/thigh/hip/shoulder) and back pain.  Psychiatric/Behavioral:  Positive for depression.   All other systems reviewed and are negative.   I have reviewed the past medical history, past surgical history, social history and family history with the patient and they are unchanged from previous note.   ALLERGIES:   is allergic to brilinta [ticagrelor], budesonide, and albuterol.   MEDICATIONS:  Current Outpatient Medications  Medication Sig Dispense Refill   acetaminophen (TYLENOL) 325 MG tablet Take 2 tablets (650 mg total) by mouth every 6 (six) hours as needed for  mild pain, fever or headache. 12 tablet 0   amLODipine (NORVASC) 5 MG tablet TAKE 1 TABLET (5 MG TOTAL) BY MOUTH DAILY 90 tablet 2   aspirin EC 81 MG tablet Take 1 tablet (81 mg total) by mouth daily with breakfast. Swallow whole. 30 tablet 11   atorvastatin (LIPITOR) 80 MG tablet TAKE 1 TABLET EVERY MORNING 90 tablet 1   b complex vitamins tablet Take 1 tablet by mouth every morning. 30 tablet 0   carboxymethylcellulose (REFRESH PLUS) 0.5 % SOLN Place 1 drop into both eyes as needed.     clopidogrel (PLAVIX) 75 MG tablet TAKE 1 TABLET EVERY DAY 90 tablet 1   DULoxetine (CYMBALTA) 30 MG capsule Take 3 capsules (90 mg total) by mouth daily. (Patient taking differently: Take 30 mg by mouth 2 (two) times daily.) 30 capsule 0   ferrous sulfate 325 (65 FE) MG tablet Take 325 mg by mouth daily.     losartan (COZAAR) 100 MG tablet TAKE 1 TABLET EVERY DAY 90 tablet 1   metoprolol succinate (TOPROL-XL) 100 MG 24 hr tablet TAKE 1 AND 1/2 TABLETS EVERY DAY. DOSE INCREASE. 135 tablet 2   Multiple Vitamin (MULTIVITAMIN ADULT PO) Take 1 tablet by mouth daily.     Multiple Vitamins-Minerals (EQ VISION FORMULA 50+ PO) Take 1 tablet by mouth every morning.     nitroGLYCERIN (NITROSTAT) 0.4 MG SL tablet Place 1 tablet (0.4 mg total) under the tongue every 5 (five) minutes x 3 doses as needed for chest pain (if no relief after 3rd dose, proceed to the ED or call 911). 25  tablet 3   oxyCODONE (OXY IR/ROXICODONE) 5 MG immediate release tablet Take by mouth.     Oxycodone HCl 10 MG TABS Take 10 mg by mouth 3 (three) times daily as needed.     PAIN MANAGEMENT INTRATHECAL, IT, PUMP by Intrathecal route.     pregabalin (LYRICA) 50 MG capsule Take 50 mg by mouth 3 (three) times daily.     No current facility-administered medications for this visit.     PHYSICAL EXAMINATION: Performance status (ECOG): 4 - Bedbound  There were no vitals filed for this visit. Wt Readings from Last 3 Encounters:  05/09/22 205 lb (93  kg)  04/21/22 205 lb (93 kg)  03/25/22 192 lb 0.3 oz (87.1 kg)   Physical Exam Vitals reviewed.  Constitutional:      Appearance: Normal appearance.  Cardiovascular:     Rate and Rhythm: Normal rate and regular rhythm.     Pulses: Normal pulses.     Heart sounds: Normal heart sounds.  Pulmonary:     Effort: Pulmonary effort is normal.     Breath sounds: Normal breath sounds.  Neurological:     General: No focal deficit present.     Mental Status: She is alert and oriented to person, place, and time.  Psychiatric:        Mood and Affect: Mood normal.        Behavior: Behavior normal.    Breast Exam Chaperone: Thana Ates     LABORATORY DATA:  I have reviewed the data as listed    Latest Ref Rng & Units 11/04/2022    1:08 PM 10/07/2022    1:06 PM 09/09/2022   11:55 AM  CMP  Glucose 70 - 99 mg/dL 111  102  116   BUN 8 - 23 mg/dL '19  21  20   '$ Creatinine 0.44 - 1.00 mg/dL 0.63  0.73  0.63   Sodium 135 - 145 mmol/L 140  139  141   Potassium 3.5 - 5.1 mmol/L 5.0  4.4  4.0   Chloride 98 - 111 mmol/L 103  103  104   CO2 22 - 32 mmol/L '31  30  29   '$ Calcium 8.9 - 10.3 mg/dL 9.0  9.2  8.9   Total Protein 6.5 - 8.1 g/dL 5.9  5.9  6.1   Total Bilirubin 0.3 - 1.2 mg/dL 1.1  1.0  1.0   Alkaline Phos 38 - 126 U/L 37  34  37   AST 15 - 41 U/L '22  22  22   '$ ALT 0 - 44 U/L '16  18  16    '$ Lab Results  Component Value Date   CAN153 18.0 09/09/2022   CAN153 17.1 06/09/2022   CAN153 16.7 02/18/2022   Lab Results  Component Value Date   WBC 4.9 09/09/2022   HGB 11.1 (L) 09/09/2022   HCT 34.1 (L) 09/09/2022   MCV 104.3 (H) 09/09/2022   PLT 198 09/09/2022   NEUTROABS 3.3 09/09/2022    ASSESSMENT:  1.  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 -Ultrasound-guided biopsy on 01/07/2016 showed grade 1 invasive ductal carcinoma, ER positive, PR positive, HER-2 equivocal by FISH, right axillary lymph node with metastatic adenocarcinoma.  HER-2 equivocal since her  2/CEP17 ratio less than 2 and average copy number of her 2 is greater than or equal to 4 but less than 6 signals per cell. -Bone biopsy on 05/30/2016 consistent with metastatic breast cancer. -Palliative therapy with letrozole started under the direction of Dr. Deitra Mayo at [facility] from 02/25/2016 through February 2020, progressive symptoms with increased soft tissue extension at L2. 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 -Addition of Leslee Home was discussed but she was concerned about cost of the medication. -CT abdomen and pelvis on 08/13/2020 shows acute compression fracture deformity of the L1 vertebral body and other chronic findings. -Her last Faslodex injection at [facility] was on 11/06/2020. -CT CAP on 02/19/2021 with no evidence of for solid organ metastasis or nodal metastasis.  Right breast lesion is stable.  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 -Bone scan on 02/19/2021 was negative for bone mets.   2.  Social/family history: -She worked for [employer] prior to retirement in 2010. -She is currently living with her son who is helping with her day-to-day activities.  Daughter lives in [place].  She quit smoking 35 to 40 years ago. -She is mostly confined to bed.  However she is able to change clothes, able to wash upper body, halfway dress herself.  She cannot stand up for the past 2 years because of weakness in the legs and knee arthritis.  She wears adult diapers.   PLAN:  1.  Metastatic right breast cancer to the bones, ER/PR positive and HER-2 equivocal: - CT CAP (11/26/2022): Stable metastatic lesions in the lumbar spine and inferior pubic ramus on the left side.  Stable prominent compression fracture at L1 with 11 mm posterior bony  retropulsion causing prominent central narrowing of the thecal sac.  Stable scarring in the right upper lobe with adjacent nodularity measuring 6 x 5 mm.  Stable 3 x 2 cm right lateral breast lesion with coarse irregular internal calcifications. - Bone scan (11/26/2022): No evidence of osseous metastatic disease. - Physical examination shows stable right upper outer quadrant lesion with no adenopathy. - Reviewed labs which showed normal LFTs and CBC.  Last tumor marker CA 15-3 was normal. - Continue monthly Faslodex.  RTC 3 months for follow-up with repeat tumor markers.  Plan on repeating scans in 6 months.  She reports that [facility] doctors are planning to do surgery on her back in the next few months.    2.  Cancer associated pain/RLQ pain: - Continue oxycodone 10 mg 3-4 times daily for breakthrough pain. - She has morphine/bupivacaine intrathecal pump.  3.  Left kidney upper pole mass: - We reviewed CT CAP (11/26/2022): 3.3 x 2.7 x 2.8 cm left upper pole kidney mass, previously 2.6 x 2.2 x 2.2 cm on 06/09/2022. - She reports that her doctors at [facility] are keeping an eye on it.  4.  Bone metastasis: - Calcium is normal today.  Continue monthly denosumab.  5.  Macrocytic anemia: - Continue iron tablet daily.  Hemoglobin is 11.3.  Last ferritin is 47 and percent saturation 25.  Breast Cancer therapy associated bone loss: I have recommended calcium, Vitamin D and weight bearing exercises.  Orders placed this encounter:  No orders of the defined types were placed in this encounter.   The patient has a good understanding of the overall plan. She agrees with it. She will call with any problems that may develop before the next visit here.  Derek Jack, MD [facility] [phone]

## 2022-12-08 NOTE — Patient Instructions (Signed)
[facility]  Discharge Instructions: Thank you for choosing [facility] to provide your oncology and hematology care.  If you have a lab appointment with the [facility], please come in thru the Main Entrance and check in at the main information desk.  Wear comfortable clothing and clothing appropriate for easy access to any Portacath or PICC line.   We strive to give you quality time with your provider. You may need to reschedule your appointment if you arrive late (15 or more minutes).  Arriving late affects you and other patients whose appointments are after yours.  Also, if you miss three or more appointments without notifying the office, you may be dismissed from the clinic at the provider's discretion.      For prescription refill requests, have your pharmacy contact our office and allow 72 hours for refills to be completed.    Today you received the following Faslodex an xgeva, return as scheduled.   To help prevent nausea and vomiting after your treatment, we encourage you to take your nausea medication as directed.  BELOW ARE SYMPTOMS THAT SHOULD BE REPORTED IMMEDIATELY: *FEVER GREATER THAN 100.4 F (38 C) OR HIGHER *CHILLS OR SWEATING *NAUSEA AND VOMITING THAT IS NOT CONTROLLED WITH YOUR NAUSEA MEDICATION *UNUSUAL SHORTNESS OF BREATH *UNUSUAL BRUISING OR BLEEDING *URINARY PROBLEMS (pain or burning when urinating, or frequent urination) *BOWEL PROBLEMS (unusual diarrhea, constipation, pain near the anus) TENDERNESS IN MOUTH AND THROAT WITH OR WITHOUT PRESENCE OF ULCERS (sore throat, sores in mouth, or a toothache) UNUSUAL RASH, SWELLING OR PAIN  UNUSUAL VAGINAL DISCHARGE OR ITCHING   Items with * indicate a potential emergency and should be followed up as soon as possible or go to the Emergency Department if any problems should occur.  Please show the CHEMOTHERAPY ALERT CARD or IMMUNOTHERAPY ALERT CARD at check-in to the Emergency Department  and triage nurse.  Should you have questions after your visit or need to cancel or reschedule your appointment, please contact [facility] [phone]  and follow the prompts.  Office hours are 8:00 a.m. to 4:30 p.m. Monday - Friday. Please note that voicemails left after 4:00 p.m. may not be returned until the following business day.  We are closed weekends and major holidays. You have access to a nurse at all times for urgent questions. Please call the main number to the clinic [phone] and follow the prompts.  For any non-urgent questions, you may also contact your provider using MyChart. We now offer e-Visits for anyone 65 and older to request care online for non-urgent symptoms. For details visit [email or web address].   Also download the MyChart app! Go to the app store, search "MyChart", open the app, select [facility], and log in with your MyChart username and password.

## 2022-12-09 LAB — CANCER ANTIGEN 27.29: CA 27.29: 17.9 U/mL (ref 0.0–38.6)

## 2022-12-10 LAB — CANCER ANTIGEN 15-3: CA 15-3: 19.8 U/mL (ref 0.0–25.0)

## 2023-01-05 ENCOUNTER — Inpatient Hospital Stay: Payer: Medicare Other

## 2023-01-05 ENCOUNTER — Other Ambulatory Visit: Payer: Self-pay

## 2023-01-05 DIAGNOSIS — C50911 Malignant neoplasm of unspecified site of right female breast: Secondary | ICD-10-CM

## 2023-01-06 ENCOUNTER — Inpatient Hospital Stay: Payer: Medicare Other | Attending: Hematology

## 2023-01-06 ENCOUNTER — Inpatient Hospital Stay: Payer: Medicare Other

## 2023-01-06 VITALS — BP 146/45 | HR 108 | Temp 98.2°F | Resp 20

## 2023-01-06 DIAGNOSIS — C50911 Malignant neoplasm of unspecified site of right female breast: Secondary | ICD-10-CM | POA: Diagnosis present

## 2023-01-06 DIAGNOSIS — Z5111 Encounter for antineoplastic chemotherapy: Secondary | ICD-10-CM | POA: Diagnosis present

## 2023-01-06 DIAGNOSIS — C7951 Secondary malignant neoplasm of bone: Secondary | ICD-10-CM | POA: Insufficient documentation

## 2023-01-06 LAB — COMPREHENSIVE METABOLIC PANEL
ALT: 16 U/L (ref 0–44)
AST: 24 U/L (ref 15–41)
Albumin: 3.3 g/dL — ABNORMAL LOW (ref 3.5–5.0)
Alkaline Phosphatase: 38 U/L (ref 38–126)
Anion gap: 9 (ref 5–15)
BUN: 19 mg/dL (ref 8–23)
CO2: 29 mmol/L (ref 22–32)
Calcium: 8.6 mg/dL — ABNORMAL LOW (ref 8.9–10.3)
Chloride: 103 mmol/L (ref 98–111)
Creatinine, Ser: 0.65 mg/dL (ref 0.44–1.00)
GFR, Estimated: >60 mL/min (ref >60)
Glucose, Bld: 174 mg/dL — ABNORMAL HIGH (ref 70–99)
Potassium: 4.3 mmol/L (ref 3.5–5.1)
Sodium: 141 mmol/L (ref 135–145)
Total Bilirubin: 1.2 mg/dL (ref 0.3–1.2)
Total Protein: 5.9 g/dL — ABNORMAL LOW (ref 6.5–8.1)

## 2023-01-06 MED ORDER — FULVESTRANT 250 MG/5ML IM SOSY
500.0000 mg | PREFILLED_SYRINGE | Freq: Once | INTRAMUSCULAR | Status: AC
  Administered 2023-01-06: 500 mg via INTRAMUSCULAR
  Filled 2023-01-06: qty 10

## 2023-01-06 MED ORDER — DENOSUMAB 120 MG/1.7ML ~~LOC~~ SOLN
120.0000 mg | Freq: Once | SUBCUTANEOUS | Status: AC
  Administered 2023-01-06: 120 mg via SUBCUTANEOUS
  Filled 2023-01-06: qty 1.7

## 2023-01-06 NOTE — Progress Notes (Signed)
Xgeva, and faslodex injections given per orders. Patient tolerated it well without problems. Vitals stable and discharged home from clinic via stretcher with trasport ambulance. Follow up as scheduled.

## 2023-01-06 NOTE — Patient Instructions (Signed)
[facility]  Discharge Instructions: Thank you for choosing [facility] to provide your oncology and hematology care.  If you have a lab appointment with the [facility], please come in thru the Main Entrance and check in at the main information desk.  Wear comfortable clothing and clothing appropriate for easy access to any Portacath or PICC line.   We strive to give you quality time with your provider. You may need to reschedule your appointment if you arrive late (15 or more minutes).  Arriving late affects you and other patients whose appointments are after yours.  Also, if you miss three or more appointments without notifying the office, you may be dismissed from the clinic at the provider's discretion.      For prescription refill requests, have your pharmacy contact our office and allow 72 hours for refills to be completed.    Today you received the following, faslodex and xgeva   To help prevent nausea and vomiting after your treatment, we encourage you to take your nausea medication as directed.  BELOW ARE SYMPTOMS THAT SHOULD BE REPORTED IMMEDIATELY: *FEVER GREATER THAN 100.4 F (38 C) OR HIGHER *CHILLS OR SWEATING *NAUSEA AND VOMITING THAT IS NOT CONTROLLED WITH YOUR NAUSEA MEDICATION *UNUSUAL SHORTNESS OF BREATH *UNUSUAL BRUISING OR BLEEDING *URINARY PROBLEMS (pain or burning when urinating, or frequent urination) *BOWEL PROBLEMS (unusual diarrhea, constipation, pain near the anus) TENDERNESS IN MOUTH AND THROAT WITH OR WITHOUT PRESENCE OF ULCERS (sore throat, sores in mouth, or a toothache) UNUSUAL RASH, SWELLING OR PAIN  UNUSUAL VAGINAL DISCHARGE OR ITCHING   Items with * indicate a potential emergency and should be followed up as soon as possible or go to the Emergency Department if any problems should occur.  Please show the CHEMOTHERAPY ALERT CARD or IMMUNOTHERAPY ALERT CARD at check-in to the Emergency Department and triage  nurse.  Should you have questions after your visit or need to cancel or reschedule your appointment, please contact [facility] [phone]  and follow the prompts.  Office hours are 8:00 a.m. to 4:30 p.m. Monday - Friday. Please note that voicemails left after 4:00 p.m. may not be returned until the following business day.  We are closed weekends and major holidays. You have access to a nurse at all times for urgent questions. Please call the main number to the clinic [phone] and follow the prompts.  For any non-urgent questions, you may also contact your provider using MyChart. We now offer e-Visits for anyone 44 and older to request care online for non-urgent symptoms. For details visit [email or web address].   Also download the MyChart app! Go to the app store, search "MyChart", open the app, select [facility], and log in with your MyChart username and password.

## 2023-01-07 ENCOUNTER — Telehealth: Payer: Self-pay | Admitting: Cardiology

## 2023-01-07 NOTE — Telephone Encounter (Signed)
   Name: Stacey [name]  DOB: 11-03-1946  MRN: [id number]  Primary Cardiologist: Carlyle Dolly, MD  Chart reviewed as part of pre-operative protocol coverage. Because of [place] B Pastorino's past medical history and time since last visit, she will require a follow-up in-office visit in order to better assess preoperative cardiovascular risk.  Pre-op covering staff: - Please schedule appointment and call patient to inform them. If patient already had an upcoming appointment within acceptable timeframe, please add "pre-op clearance" to the appointment notes so provider is aware. - Please contact requesting surgeon's office via preferred method (i.e, phone, fax) to inform them of need for appointment prior to surgery.  This message will also be routed to  Dr Harl Bowie for input on holding ASA and Plavix as requested below so that this information is available to the clearing provider at time of patient's appointment.   Lenna Sciara, NP  01/07/2023, 12:30 PM

## 2023-01-07 NOTE — Telephone Encounter (Signed)
   Pre-operative Risk Assessment    Patient Name: Stacey [name]  DOB: [birth year] MRN: [id number]      Request for Surgical Clearance    Procedure:   Lumbar corpectomy and fusion  Date of Surgery:  Clearance TBD                                 Surgeon:  Marland Kitchen, PA Surgeon's Group or Practice Name:  [facility] Phone number:  [phone] Fax number:  [phone]   Type of Clearance Requested:   - Medical  - Pharmacy:  Hold Aspirin and Clopidogrel (Plavix) Can patient stop 7 days prior to surgery?   Type of Anesthesia:  General    Additional requests/questions:    Signed, Hipolito Bayley   01/07/2023, 7:36 AM

## 2023-01-08 NOTE — Telephone Encounter (Signed)
1st attempt to reach pt regarding surgical clearance and the need for an in office appointment.  Left pt a message to call back and get that scheduled.

## 2023-01-09 NOTE — Telephone Encounter (Signed)
I s/w the pt and she is agreeable to in office appt for pre op clearance. Pt has been scheduled to see Finis Bud, NP in the [place] office 01/13/23 @ 10:30. Pt tells me that she is completley bed ridden and will be coming in on a stretcher and will need to stay on th stretcher for her appt.   I will update all parties involved.

## 2023-01-13 ENCOUNTER — Encounter: Payer: Self-pay | Admitting: Nurse Practitioner

## 2023-01-13 ENCOUNTER — Ambulatory Visit: Payer: Medicare Other | Attending: Nurse Practitioner | Admitting: Nurse Practitioner

## 2023-01-13 VITALS — BP 116/60 | HR 69 | Ht 60.0 in

## 2023-01-13 DIAGNOSIS — I2510 Atherosclerotic heart disease of native coronary artery without angina pectoris: Secondary | ICD-10-CM

## 2023-01-13 DIAGNOSIS — Z8679 Personal history of other diseases of the circulatory system: Secondary | ICD-10-CM | POA: Diagnosis present

## 2023-01-13 DIAGNOSIS — I10 Essential (primary) hypertension: Secondary | ICD-10-CM

## 2023-01-13 DIAGNOSIS — I480 Paroxysmal atrial fibrillation: Secondary | ICD-10-CM | POA: Diagnosis not present

## 2023-01-13 DIAGNOSIS — Z01818 Encounter for other preprocedural examination: Secondary | ICD-10-CM

## 2023-01-13 DIAGNOSIS — Z01810 Encounter for preprocedural cardiovascular examination: Secondary | ICD-10-CM

## 2023-01-13 DIAGNOSIS — I447 Left bundle-branch block, unspecified: Secondary | ICD-10-CM

## 2023-01-13 DIAGNOSIS — I5032 Chronic diastolic (congestive) heart failure: Secondary | ICD-10-CM | POA: Diagnosis not present

## 2023-01-13 DIAGNOSIS — C50919 Malignant neoplasm of unspecified site of unspecified female breast: Secondary | ICD-10-CM | POA: Diagnosis present

## 2023-01-13 NOTE — Patient Instructions (Signed)
Medication Instructions:  Continue all current medications.   Labwork: none  Testing/Procedures: none  Follow-Up: 6 months - can be virtual visit (telephone call)   Any Other Special Instructions Will Be Listed Below (If Applicable).   If you need a refill on your cardiac medications before your next appointment, please call your pharmacy.

## 2023-01-13 NOTE — Progress Notes (Unsigned)
Cardiology Office Note:    Date:  01/13/2023  ID:  Stacey [name], DOB July 06, 1946, MRN [id number]  PCP:  Glenda Chroman, MD   [place] [facility] Cardiologist:  Carlyle Dolly, MD     Referring MD: Glenda Chroman, MD   CC: Pre-operative cardiovascular risk assessment  History of Present Illness:    Stacey [name] is a 77 y.o. female with a hx of the following:   Chronic systolic CHF, ICM CAD A-fib Mild aortic stenosis Chronic LBBB Hypertension History of metastatic breast cancer/chronic pain (followed by palliative care and followed by [facility])  Patient is a 77 year old female with past medical history as mentioned above.  History of DES to ramus, late stent thrombosis requiring PTCA in 2006.  Received a drug-eluting stent to RCA in December 2018.  Has not been on Coumadin due to intrathecal pain pump through palliative pain management at [facility].  Echocardiogram in 2018 showed EF 50%, EF dropped to 30 to 35% the following year.  In January 2020, echocardiogram revealed EF 20 to 25%, restrictive diastolic function, PASP 40 mmHg, mild AS.  Was not found to be a candidate for device therapy given history of metastatic cancer and numerous comorbidities.  There was hesitancy to change medication to [facility] due to concern about cost.  EF in 2021 was 35 to 40%, in 2022 EF improved to 50 to 55%, grade 1 DD.   Underwent lap chole in 2022, later required incarcerated umbilical hernia repair in May 2022.  Last seen by Dr. Carlyle Dolly on Apr 21, 2022.  Was doing well at that time.  Was compliant with her medications.  Denied any acute cardiac complaints or concerns.  No medication changes were made.  Was told to follow-up in 6 months.  Our office has been recently contacted for preoperative cardiovascular clearance for pending lumbar corpectomy and fusion.  Name of surgeon is Marland Kitchen, [place] with [facility] preanesthesia testing units.  Our office has been contacted  regarding holding aspirin and Plavix to see if patient can stop 7 days prior to surgery.  Surgery will be performed under general anesthesia.  Date of surgery is TBD.  Today she presents for follow-up.  She states she is doing well. Recently had Echo performed at [facility]. EF > 55%, mildly thickened aortic valve, no valvular abnormalities. Continues to follow up with Dr. Delton Coombes. Doing well from a cardiac perspective. Denies any chest pain, shortness of breath, palpitations, syncope, presyncope, dizziness, orthopnea, PND, swelling or significant weight changes, acute bleeding, or claudication. Has been bed bound for over 2 years. Son and daughter help provide care for her. Denies any foley use or bed sores.   Past Medical History:  Diagnosis Date   CAD in native artery    a. DES to ramus 2005 with late stent thrombosis 2006 tx with PTCA. 12/18 PCI/DES x1 to mRCA, EF 50-55%   CHF (congestive heart failure) (HCC)    Chronic pain    COPD (chronic obstructive pulmonary disease) (HCC)    Hyperlipidemia    Hypertension    Left bundle branch block    Lymphedema    Metastatic breast cancer    a. to bone.   MI (myocardial infarction) ([place])    Mild aortic stenosis 10/2017   Morbid obesity ([place])    PAF (paroxysmal atrial fibrillation) (HCC)    PSVT (paroxysmal supraventricular tachycardia)    a. per [facility] notes, seen on event monitor in 2014.   Pulmonary nodules  Past Surgical History:  Procedure Laterality Date   CHOLECYSTECTOMY N/A 03/29/2021   Procedure: LAPAROSCOPIC CHOLECYSTECTOMY;  Surgeon: [name], MD;  Location: [facility];  Service: General;  Laterality: N/A;   CORONARY STENT INTERVENTION N/A 11/26/2017   Procedure: CORONARY STENT INTERVENTION;  Surgeon: Martinique, Peter M, MD;  Location: [place] CV [facility];  Service: Cardiovascular;  Laterality: N/A;   CORONARY STENT PLACEMENT     ERCP N/A 03/28/2021   Procedure: ENDOSCOPIC RETROGRADE CHOLANGIOPANCREATOGRAPHY (ERCP);  Surgeon:  Rogene Houston, MD;  Location: [facility];  Service: Endoscopy;  Laterality: N/A;   intrathecal pain pump     LEFT HEART CATH AND CORONARY ANGIOGRAPHY N/A 11/26/2017   Procedure: LEFT HEART CATH AND CORONARY ANGIOGRAPHY;  Surgeon: Martinique, Peter M, MD;  Location: [place] [facility];  Service: Cardiovascular;  Laterality: N/A;   REMOVAL OF STONES  03/28/2021   Procedure: REMOVAL OF STONES;  Surgeon: Rogene Houston, MD;  Location: [facility];  Service: Endoscopy;;   SPHINCTEROTOMY  03/28/2021   Procedure: SPHINCTEROTOMY;  Surgeon: Rogene Houston, MD;  Location: [facility];  Service: Endoscopy;;   UMBILICAL HERNIA REPAIR N/A 04/03/2021   Procedure: ADULT PRIMARY UMBILICAL HERNIA REPAIR;  Surgeon: [name], MD;  Location: [facility];  Service: General;  Laterality: N/A;   VASCULAR SURGERY      Current Medications: Current Meds  Medication Sig   acetaminophen (TYLENOL) 325 MG tablet Take 2 tablets (650 mg total) by mouth every 6 (six) hours as needed for mild pain, fever or headache.   amLODipine (NORVASC) 5 MG tablet TAKE 1 TABLET (5 MG TOTAL) BY MOUTH DAILY   aspirin EC 81 MG tablet Take 1 tablet (81 mg total) by mouth daily with breakfast. Swallow whole.   atorvastatin (LIPITOR) 80 MG tablet TAKE 1 TABLET EVERY MORNING   b complex vitamins tablet Take 1 tablet by mouth every morning.   carboxymethylcellulose (REFRESH PLUS) 0.5 % SOLN Place 1 drop into both eyes as needed.   clopidogrel (PLAVIX) 75 MG tablet TAKE 1 TABLET EVERY DAY   DULoxetine (CYMBALTA) 30 MG capsule Take 3 capsules (90 mg total) by mouth daily. (Patient taking differently: Take 30 mg by mouth 2 (two) times daily.)   ferrous sulfate 325 (65 FE) MG tablet Take 325 mg by mouth daily.   losartan (COZAAR) 100 MG tablet TAKE 1 TABLET EVERY DAY   metoprolol succinate (TOPROL-XL) 100 MG 24 hr tablet TAKE 1 AND 1/2 TABLETS EVERY DAY. DOSE INCREASE.   Multiple Vitamin (MULTIVITAMIN ADULT PO) Take 1 tablet by mouth daily.   Multiple  Vitamins-Minerals (EQ VISION FORMULA 50+ PO) Take 1 tablet by mouth every morning.   naloxone (NARCAN) nasal spray 4 mg/0.1 mL SMARTSIG:Both Nares   nitroGLYCERIN (NITROSTAT) 0.4 MG SL tablet Place 1 tablet (0.4 mg total) under the tongue every 5 (five) minutes x 3 doses as needed for chest pain (if no relief after 3rd dose, proceed to the ED or call 911).   oxyCODONE (OXY IR/ROXICODONE) 5 MG immediate release tablet Take by mouth.   Oxycodone HCl 10 MG TABS Take 10 mg by mouth 3 (three) times daily as needed.   PAIN MANAGEMENT INTRATHECAL, IT, PUMP by Intrathecal route.   pregabalin (LYRICA) 50 MG capsule Take 50 mg by mouth 3 (three) times daily.     Allergies:   Brilinta [ticagrelor], Budesonide, and Albuterol   Social History   Socioeconomic History   Marital status: Single    Spouse name: Not  on file   Number of children: Not on file   Years of education: Not on file   Highest education level: Not on file  Occupational History   Occupation: Retired  Tobacco Use   Smoking status: Former    Packs/day: 2.00    Years: 18.00    Total pack years: 36.00    Types: Cigarettes    Quit date: 12/01/1980    Years since quitting: 42.1   Smokeless tobacco: Never  Vaping Use   Vaping Use: Never used  Substance and Sexual Activity   Alcohol use: No   Drug use: Yes    Types: Oxycodone, Morphine    Comment: takes methadone   Sexual activity: Not Currently  Other Topics Concern   Not on file  Social History Narrative   Not on file   Social Determinants of Health   Financial Resource Strain: Not on file  Food Insecurity: Not on file  Transportation Needs: No Transportation Needs (02/07/2021)   PRAPARE - Hydrologist (Medical): No    Lack of Transportation (Non-Medical): No  Physical Activity: Inactive (02/07/2021)   Exercise Vital Sign    Days of Exercise per Week: 0 days    Minutes of Exercise per Session: 0 min  Stress: Not on file  Social  Connections: Not on file     Family History: The patient's family history includes CAD in her paternal grandmother; CAD (age of onset: 58) in her father; COPD in her sister; Heart attack in her father. There is no history of Sudden Cardiac Death, Colon polyps, or Colon cancer.  ROS:   Please see the history of present illness.     All other systems reviewed and are negative.  EKGs/Labs/Other Studies Reviewed:    The following studies were reviewed today:   EKG:  EKG is  ordered today.  The ekg ordered today demonstrates NSR, 65 bpm, LBBB, no acute ischemic changes.   Echocardiogram 01/05/2023 ([facility]):  INTERPRETATION ---------------------------------------------------------------  NORMAL LEFT VENTRICULAR SYSTOLIC FUNCTION  NORMAL RIGHT VENTRICULAR SYSTOLIC FUNCTION  VALVULAR REGURGITATION: TRIVIAL PR, TRIVIAL TR  NO VALVULAR STENOSIS  NO PRIOR STUDY FOR COMPARISON  Left heart cath on 11/26/2017:  Ost Ramus to Ramus lesion is 100% stenosed. Mid RCA lesion is 90% stenosed. A drug-eluting stent was successfully placed using a STENT SYNERGY DES 2.5X16. Post intervention, there is a 0% residual stenosis. LV end diastolic pressure is normal.   1. 2 vessel obstructive CAD    - 100% proximal ramus intermediate at site of prior stent. I think this is a CTO and she has good collaterals.     - 90% ulcerative mid RCA 2. Normal LVEDP 3. Successful PCI of the mid RCA with DES   Plan: DAPT with ASA and Brilinta for one year.   Recent Labs: 05/09/2022: B Natriuretic Peptide 77.0 12/08/2022: Hemoglobin 11.3; Magnesium 1.8; Platelets 202 01/06/2023: ALT 16; BUN 19; Creatinine, Ser 0.65; Potassium 4.3; Sodium 141  Recent Lipid Panel No results found for: "CHOL", "TRIG", "HDL", "CHOLHDL", "VLDL", "LDLCALC", "LDLDIRECT"   Risk Assessment/Calculations:    CHA2DS2-VASc Score = 6   This indicates a 9.7% annual risk of stroke. The patient's score is based upon: CHF History: 1 HTN History:  1 Diabetes History: 0 Stroke History: 0 Vascular Disease History: 1 Age Score: 2 Gender Score: 1         Physical Exam:    VS:  BP 116/60   Pulse 69   Ht 5' (  1.524 m)   SpO2 96%   BMI 40.04 kg/m     Wt Readings from Last 3 Encounters:  05/09/22 205 lb (93 kg)  04/21/22 205 lb (93 kg)  03/25/22 192 lb 0.3 oz (87.1 kg)     GEN: Well nourished, well developed 77 y.o. female in no acute distress, lying on stretcher HEENT: Normal NECK: No JVD; No carotid bruits CARDIAC: S1/S2, RRR, no murmurs, rubs, gallops; 2+ pulses RESPIRATORY:  Clear to auscultation without rales, wheezing or rhonchi  MUSCULOSKELETAL:  Generalized, nonpitting edema; immobile/bed-bound SKIN: Warm and dry NEUROLOGIC:  Alert and oriented x 3 PSYCHIATRIC:  Normal, pleasant affect   ASSESSMENT:    1. Pre-operative cardiovascular examination   2. Chronic diastolic CHF (congestive heart failure) ([place])   3. Coronary artery disease involving native coronary artery of native heart without angina pectoris   4. Essential hypertension, benign   5. PAF (paroxysmal atrial fibrillation) ([place])   6. Left bundle branch block   7. History of aortic stenosis   8. Malignant neoplasm of female breast, unspecified estrogen receptor status, unspecified laterality, unspecified site of breast ([place])    PLAN:    In order of problems listed above:  Pre-operative cardiovascular risk assessment Ms. Schloesser's perioperative risk of a major cardiac event is 6.6% according to the Revised Cardiac Risk Index (RCRI).  Therefore, she is at high risk for perioperative complications.   Her functional capacity is poor at 2.74 METs according to the [facility] Activity Status Index (DASI). Recommendations: The patient is at high risk for perioperative cardiac complications and is at a low functional capacity.  However, further testing will not change how her cardiac status is managed.  Proceed with surgery at high risk if there are no other options  for treatment. Case d/w Dr. Carlyle Dolly, patient's primary cardiologist, who stated "I think surgery is urgently indicated for quality of life as she is bed bound with long history of chronic severe back pains and stress testing would unneccearily delay the procedure and not lead to any meaningful changes in care given poor cath candidate." Spoke with patient regarding high risk for perioperative complications. She verbalized understanding regarding risk and is agreeable to proceed.  Antiplatelet and/or Anticoagulation Recommendations: Aspirin and Plavix can be held for 7 days prior to surgery and can be resumed when felt safe to do so. She does not require SBE prophylaxis prior to surgery. Will route this note to requesting party.   2. Chronic diastolic CHF Recent Echo at [facility] showed preserved EF. Euvolemic and well compensated on exam. Continue current medication regimen. Low sodium diet, fluid restriction <2L, and daily weights encouraged. Educated to contact our office for weight gain of 2 lbs overnight or 5 lbs in one week. Low salt, heart healthy diet recommended.   3. CAD Stable with no anginal symptoms. No indication for ischemic evaluation. Continue ASA, atorvastatin, Plavix, losartan, Toprol XL, and NG PRN (see instructions listed above #1). Low salt, heart healthy diet recommended.   4. HTN BP stable. BP well controlled at home. No change to medication regimen. Discussed to monitor BP at home at least 2 hours after medications and sitting for 5-10 minutes. Low salt, heart healthy diet recommended.   5. PAF, LBBB Denies any tachycardia or palpitations. EKG shows NSR with hx of chronic LBBB. Denies any symptoms. Not on AC d/t being on intrathecal pump. Continue current medication regimen. Low salt, heart healthy diet recommended.   6. Hx of aortic stenosis Moderate aortic  valve stenosis noted on Echo in 2022. Repeat TTE with [facility] performed recently was negative for any valvular  stenosis. Continue current medication regimen. Low salt, heart healthy diet recommended.   7. Metastatic breast cancer Following Dr. Delton Coombes. Continue current treatment plan and continue to follow with palliative care.   8. Disposition: Follow-up with Dr. Carlyle Dolly or APP via virtual visit in 6 months.   Medication Adjustments/Labs and Tests Ordered: Current medicines are reviewed at length with the patient today.  Concerns regarding medicines are outlined above.  Orders Placed This Encounter  Procedures   EKG 12-Lead   No orders of the defined types were placed in this encounter.   Patient Instructions  Medication Instructions:  Continue all current medications.   Labwork: none  Testing/Procedures: none  Follow-Up: 6 months - can be virtual visit (telephone call)   Any Other Special Instructions Will Be Listed Below (If Applicable).   If you need a refill on your cardiac medications before your next appointment, please call your pharmacy.    SignedFinis Bud, NP  01/15/2023 10:17 AM    [place]

## 2023-01-29 ENCOUNTER — Encounter: Payer: Self-pay | Admitting: Radiology

## 2023-01-30 ENCOUNTER — Other Ambulatory Visit: Payer: Self-pay

## 2023-01-30 DIAGNOSIS — C50911 Malignant neoplasm of unspecified site of right female breast: Secondary | ICD-10-CM

## 2023-02-02 ENCOUNTER — Inpatient Hospital Stay: Payer: Medicare Other | Attending: Hematology

## 2023-02-02 ENCOUNTER — Inpatient Hospital Stay: Payer: Medicare Other

## 2023-02-02 VITALS — BP 132/57 | HR 63 | Temp 98.3°F | Resp 18

## 2023-02-02 DIAGNOSIS — C7951 Secondary malignant neoplasm of bone: Secondary | ICD-10-CM | POA: Diagnosis present

## 2023-02-02 DIAGNOSIS — C50911 Malignant neoplasm of unspecified site of right female breast: Secondary | ICD-10-CM | POA: Diagnosis present

## 2023-02-02 DIAGNOSIS — Z5111 Encounter for antineoplastic chemotherapy: Secondary | ICD-10-CM | POA: Diagnosis present

## 2023-02-02 LAB — CBC WITH DIFFERENTIAL/PLATELET
Abs Immature Granulocytes: 0.02 10*3/uL (ref 0.00–0.07)
Basophils Absolute: 0 10*3/uL (ref 0.0–0.1)
Basophils Relative: 1 %
Eosinophils Absolute: 0.1 10*3/uL (ref 0.0–0.5)
Eosinophils Relative: 3 %
HCT: 34.7 % — ABNORMAL LOW (ref 36.0–46.0)
Hemoglobin: 11.5 g/dL — ABNORMAL LOW (ref 12.0–15.0)
Immature Granulocytes: 1 %
Lymphocytes Relative: 26 %
Lymphs Abs: 1.1 10*3/uL (ref 0.7–4.0)
MCH: 34 pg (ref 26.0–34.0)
MCHC: 33.1 g/dL (ref 30.0–36.0)
MCV: 102.7 fL — ABNORMAL HIGH (ref 80.0–100.0)
Monocytes Absolute: 0.3 10*3/uL (ref 0.1–1.0)
Monocytes Relative: 7 %
Neutro Abs: 2.6 10*3/uL (ref 1.7–7.7)
Neutrophils Relative %: 62 %
Platelets: 181 10*3/uL (ref 150–400)
RBC: 3.38 MIL/uL — ABNORMAL LOW (ref 3.87–5.11)
RDW: 13.1 % (ref 11.5–15.5)
WBC: 4.2 10*3/uL (ref 4.0–10.5)
nRBC: 0 % (ref 0.0–0.2)

## 2023-02-02 LAB — COMPREHENSIVE METABOLIC PANEL
ALT: 17 U/L (ref 0–44)
AST: 23 U/L (ref 15–41)
Albumin: 3.4 g/dL — ABNORMAL LOW (ref 3.5–5.0)
Alkaline Phosphatase: 40 U/L (ref 38–126)
Anion gap: 6 (ref 5–15)
BUN: 19 mg/dL (ref 8–23)
CO2: 31 mmol/L (ref 22–32)
Calcium: 9.4 mg/dL (ref 8.9–10.3)
Chloride: 100 mmol/L (ref 98–111)
Creatinine, Ser: 0.67 mg/dL (ref 0.44–1.00)
GFR, Estimated: >60 mL/min (ref >60)
Glucose, Bld: 113 mg/dL — ABNORMAL HIGH (ref 70–99)
Potassium: 4.4 mmol/L (ref 3.5–5.1)
Sodium: 137 mmol/L (ref 135–145)
Total Bilirubin: 0.9 mg/dL (ref 0.3–1.2)
Total Protein: 6.4 g/dL — ABNORMAL LOW (ref 6.5–8.1)

## 2023-02-02 LAB — MAGNESIUM: Magnesium: 2 mg/dL (ref 1.7–2.4)

## 2023-02-02 MED ORDER — DENOSUMAB 120 MG/1.7ML ~~LOC~~ SOLN
120.0000 mg | Freq: Once | SUBCUTANEOUS | Status: AC
  Administered 2023-02-02: 120 mg via SUBCUTANEOUS
  Filled 2023-02-02: qty 1.7

## 2023-02-02 MED ORDER — FULVESTRANT 250 MG/5ML IM SOSY
500.0000 mg | PREFILLED_SYRINGE | Freq: Once | INTRAMUSCULAR | Status: AC
  Administered 2023-02-02: 500 mg via INTRAMUSCULAR
  Filled 2023-02-02: qty 10

## 2023-02-02 NOTE — Patient Instructions (Signed)
[facility]  Discharge Instructions: Thank you for choosing [facility] to provide your oncology and hematology care.  If you have a lab appointment with the [facility], please come in thru the Main Entrance and check in at the main information desk.  Wear comfortable clothing and clothing appropriate for easy access to any Portacath or PICC line.   We strive to give you quality time with your provider. You may need to reschedule your appointment if you arrive late (15 or more minutes).  Arriving late affects you and other patients whose appointments are after yours.  Also, if you miss three or more appointments without notifying the office, you may be dismissed from the clinic at the provider's discretion.      For prescription refill requests, have your pharmacy contact our office and allow 72 hours for refills to be completed.    Today you received the following chemotherapy and/or immunotherapy agents xgeva and faslodex injections   To help prevent nausea and vomiting after your treatment, we encourage you to take your nausea medication as directed.  BELOW ARE SYMPTOMS THAT SHOULD BE REPORTED IMMEDIATELY: *FEVER GREATER THAN 100.4 F (38 C) OR HIGHER *CHILLS OR SWEATING *NAUSEA AND VOMITING THAT IS NOT CONTROLLED WITH YOUR NAUSEA MEDICATION *UNUSUAL SHORTNESS OF BREATH *UNUSUAL BRUISING OR BLEEDING *URINARY PROBLEMS (pain or burning when urinating, or frequent urination) *BOWEL PROBLEMS (unusual diarrhea, constipation, pain near the anus) TENDERNESS IN MOUTH AND THROAT WITH OR WITHOUT PRESENCE OF ULCERS (sore throat, sores in mouth, or a toothache) UNUSUAL RASH, SWELLING OR PAIN  UNUSUAL VAGINAL DISCHARGE OR ITCHING   Items with * indicate a potential emergency and should be followed up as soon as possible or go to the Emergency Department if any problems should occur.  Please show the CHEMOTHERAPY ALERT CARD or IMMUNOTHERAPY ALERT CARD at  check-in to the Emergency Department and triage nurse.  Should you have questions after your visit or need to cancel or reschedule your appointment, please contact [facility] [phone]  and follow the prompts.  Office hours are 8:00 a.m. to 4:30 p.m. Monday - Friday. Please note that voicemails left after 4:00 p.m. may not be returned until the following business day.  We are closed weekends and major holidays. You have access to a nurse at all times for urgent questions. Please call the main number to the clinic [phone] and follow the prompts.  For any non-urgent questions, you may also contact your provider using MyChart. We now offer e-Visits for anyone 48 and older to request care online for non-urgent symptoms. For details visit [email or web address].   Also download the MyChart app! Go to the app store, search "MyChart", open the app, select [facility], and log in with your MyChart username and password.

## 2023-02-02 NOTE — Progress Notes (Signed)
Xgeva and faslodex injectins given per orders. Patient tolerated it well without problems. Vitals stable and discharged home from clinic via stretcher with [employer].  Follow up as scheduled.

## 2023-02-22 ENCOUNTER — Encounter (HOSPITAL_COMMUNITY): Payer: Self-pay | Admitting: Hematology

## 2023-02-22 ENCOUNTER — Emergency Department (HOSPITAL_COMMUNITY): Payer: Medicare Other

## 2023-02-22 ENCOUNTER — Encounter (HOSPITAL_COMMUNITY): Payer: Self-pay

## 2023-02-22 ENCOUNTER — Other Ambulatory Visit: Payer: Self-pay

## 2023-02-22 ENCOUNTER — Emergency Department (HOSPITAL_COMMUNITY)
Admission: EM | Admit: 2023-02-22 | Discharge: 2023-02-22 | Disposition: A | Discharge status: Home / Self Care | Payer: Medicare Other | Attending: Emergency Medicine | Admitting: Emergency Medicine

## 2023-02-22 DIAGNOSIS — Z7982 Long term (current) use of aspirin: Secondary | ICD-10-CM | POA: Diagnosis not present

## 2023-02-22 DIAGNOSIS — Z7902 Long term (current) use of antithrombotics/antiplatelets: Secondary | ICD-10-CM | POA: Insufficient documentation

## 2023-02-22 DIAGNOSIS — S32010S Wedge compression fracture of first lumbar vertebra, sequela: Secondary | ICD-10-CM | POA: Diagnosis not present

## 2023-02-22 DIAGNOSIS — G8929 Other chronic pain: Secondary | ICD-10-CM

## 2023-02-22 DIAGNOSIS — S3992XS Unspecified injury of lower back, sequela: Secondary | ICD-10-CM | POA: Diagnosis present

## 2023-02-22 DIAGNOSIS — X58XXXS Exposure to other specified factors, sequela: Secondary | ICD-10-CM | POA: Insufficient documentation

## 2023-02-22 DIAGNOSIS — Z79899 Other long term (current) drug therapy: Secondary | ICD-10-CM | POA: Insufficient documentation

## 2023-02-22 MED ORDER — ONDANSETRON HCL 4 MG/2ML IJ SOLN
4.0000 mg | Freq: Once | INTRAMUSCULAR | Status: AC
  Administered 2023-02-22: 4 mg via INTRAVENOUS
  Filled 2023-02-22: qty 2

## 2023-02-22 MED ORDER — LIDOCAINE 5 % EX PTCH
1.0000 | MEDICATED_PATCH | CUTANEOUS | Status: DC
  Administered 2023-02-22: 1 via TRANSDERMAL
  Filled 2023-02-22: qty 1

## 2023-02-22 MED ORDER — HYDROMORPHONE HCL 1 MG/ML IJ SOLN
1.0000 mg | Freq: Once | INTRAMUSCULAR | Status: AC
  Administered 2023-02-22: 1 mg via INTRAVENOUS
  Filled 2023-02-22: qty 1

## 2023-02-22 NOTE — ED Triage Notes (Signed)
Pt c/o pain to right hip/thigh coming from back issues. States the pain got worse yesterday and today. States she can't take the pain, pt is bed bound for around 2 yrs at this time. Is a patient of [facility], which is planning a surgery at some point.

## 2023-02-22 NOTE — ED Notes (Signed)
Called [facility] for transport at this time to take back to residence. Stacey [name]

## 2023-02-22 NOTE — Discharge Instructions (Addendum)
Your CT scan does not show any new compression fractures, or evidence for cancer complications in the bones of your spine which is reassuring.  I do recommend reaching out to your pain specialist tomorrow for further management if your pain does not continue to be better controlled.

## 2023-02-22 NOTE — ED Provider Notes (Signed)
[facility] Provider Note   CSN: [id number] Arrival date & time: 02/22/23  1234     History {Add pertinent medical, surgical, social history, OB history to HPI:1} Chief Complaint  Patient presents with   Leg Pain   Back Pain    DEETTA WICKE is a 77 y.o. female   The history is provided by the patient.   Vitals: BP 130/65 (BP Location: Left forearm, Patient Position: Sitting, BP Cuff Size: Adult)  Pulse 71  Temp 36.4 C (97.6 F) (Oral)  Resp 18  SpO2 98% There is no height or weight on file to calculate BSA. General: Arrives by EMT on stretcher: non-ambulatory: cannot transfer by herself: HEENT: Normocephalic without obvious abnormality, atraumatic, PERRL, EOMI, Lungs: normal work of breathing on room air Back: No spinous or paraspinous tenderness to palpation Neuro: Cranial nerves II-XII intact; UE strength 5/5 LE: Right: can wiggle toes, otherwise 1/5 Left: has 2+/5 strength  STUDIES: as above: findings on CT and MRI of calcified L1 compression fracture with osteophyte compression of cauda and nerve root exits. Otherwise, metastasis disease mostly stable: Enlarging left renal lesion.  ASSESSMENT: 77 y.o. 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 Continues to show no evidence of active cancer in her spine on imaging studies. KPS: 40- Disabled: requires special care and assistance  PLAN: We had a long discussion about the patient about her chronic low back pain, and increasing weakness. We again discussed that this is not likely due to active cancer, but rather due to a combination of vertebral compression fracture and arthritic reactive changes from fracture and prior radiation  treatments. Given this and her prior re-irradiation to the lumbar spine, we do not recommend further palliative radiation at this time. Ms. Densmore has been re-evaluated by the Spine Surgery team at [facility] (Dr. Nikki Dom), and surgery is deemed high risk. Attempts are underway to improve her conditioning to see if surgery could be considered. Also, the growing left renal lesion should be addressed. Ms. Southworth was also recently re-evaluated by the Pain Team, and intrathecal pain medicines were increased. At this time, we will await final decision on surgical intervention for back pain/weakness. Consider also GU oncology evaluation for growing lesion in the left kidney.  Attestation Statement:  I personally performed the service. (TP)  SCOTT Colin Rhein, MD   Electronically signed by Esaw Dace, MD at 12/02/2022 5:51 PM EST  Back to top of Progress Notes    Home Medications Prior to Admission medications   Medication Sig Start Date End Date Taking? Authorizing Provider  acetaminophen (TYLENOL) 325 MG tablet Take 2 tablets (650 mg total) by mouth every 6 (six) hours as needed for mild pain, fever or headache. 04/05/21   Roxan Hockey, MD  amLODipine (NORVASC) 5 MG tablet TAKE 1 TABLET (5 MG TOTAL) BY MOUTH DAILY 01/27/22   Arnoldo Lenis, MD  aspirin EC 81 MG tablet Take 1 tablet (81 mg total) by mouth daily with breakfast. Swallow whole. 04/07/21   Roxan Hockey, MD  atorvastatin (LIPITOR) 80 MG tablet TAKE 1 TABLET EVERY MORNING 09/15/22   Arnoldo Lenis, MD  b complex vitamins tablet Take 1 tablet by mouth every morning. 07/05/20   Florencia Reasons, MD  carboxymethylcellulose (REFRESH PLUS) 0.5 % SOLN Place 1  drop into both eyes as needed.    [provider]  clopidogrel (PLAVIX) 75 MG tablet TAKE 1 TABLET EVERY DAY 09/15/22   Arnoldo Lenis, MD  DULoxetine (CYMBALTA) 30 MG capsule Take 3 capsules (90 mg total) by mouth daily. Patient taking differently: Take 30 mg by mouth  2 (two) times daily. 07/06/20   Florencia Reasons, MD  ferrous sulfate 325 (65 FE) MG tablet Take 325 mg by mouth daily.    [provider]  losartan (COZAAR) 100 MG tablet TAKE 1 TABLET EVERY DAY 09/10/22   Arnoldo Lenis, MD  metoprolol succinate (TOPROL-XL) 100 MG 24 hr tablet TAKE 1 AND 1/2 TABLETS EVERY DAY. DOSE INCREASE. 06/13/22   Arnoldo Lenis, MD  Multiple Vitamin (MULTIVITAMIN ADULT PO) Take 1 tablet by mouth daily.    [provider]  Multiple Vitamins-Minerals (EQ VISION FORMULA 50+ PO) Take 1 tablet by mouth every morning.    [provider]  naloxone (NARCAN) nasal spray 4 mg/0.1 mL SMARTSIG:Both Nares    [provider]  nitroGLYCERIN (NITROSTAT) 0.4 MG SL tablet Place 1 tablet (0.4 mg total) under the tongue every 5 (five) minutes x 3 doses as needed for chest pain (if no relief after 3rd dose, proceed to the ED or call 911). 07/11/21   Arnoldo Lenis, MD  oxyCODONE (OXY IR/ROXICODONE) 5 MG immediate release tablet Take by mouth. 10/31/22   [provider]  Oxycodone HCl 10 MG TABS Take 10 mg by mouth 3 (three) times daily as needed. 08/26/22   [provider]  PAIN MANAGEMENT INTRATHECAL, IT, PUMP by Intrathecal route. 02/21/21   [provider]  pregabalin (LYRICA) 50 MG capsule Take 50 mg by mouth 3 (three) times daily. 01/26/21   [provider]      Allergies    Brilinta [ticagrelor], Budesonide, and Albuterol    Review of Systems   Review of Systems  Physical Exam Updated Vital Signs BP (!) 138/50   Pulse 63   Temp 98 F (36.7 C) (Oral)   Resp 19   Ht 5' (1.524 m)   Wt 93 kg   SpO2 93%   BMI 40.04 kg/m  Physical Exam  ED Results / Procedures / Treatments   Labs (all labs ordered are listed, but only abnormal results are displayed) Labs Reviewed - No data to display  EKG None  Radiology CT Lumbar Spine Wo Contrast  Result Date: 02/22/2023 CLINICAL DATA:  Breast cancer.  Known  compression fractures. EXAM: CT LUMBAR SPINE WITHOUT CONTRAST TECHNIQUE: Multidetector CT imaging of the lumbar spine was performed without intravenous contrast administration. Multiplanar CT image reconstructions were also generated. RADIATION DOSE REDUCTION: This exam was performed according to the departmental dose-optimization program which includes automated exposure control, adjustment of the mA and/or kV according to patient size and/or use of iterative reconstruction technique. COMPARISON:  CT July 2023, MRI June 2023, x-ray October 2021. FINDINGS: Severe compression deformity identified at the L1 level. Sclerosis of the L2 suggests underlying osseous lesion. Large posterior projecting fragments at the L1 level resulting in narrowing of the spinal canal. Marginal osteophytes identified at each lumbar level. Grade 1 L5 anterolisthesis. Dense atheromatous calcification of the aorta noted. IMPRESSION: 1. Large posterior projecting fragment associated with an L1 compression deformity similar to previous x-rays and MRI. Underlying pathologic fracture suspected. 2. Sclerosis of L2 also concerning for underlying neoplastic etiology. 3. The extent of compromise of underlying cord or conus can be assessed with  MRI if indicated. Electronically Signed   By: Sammie Bench M.D.   On: 02/22/2023 16:55    Procedures Procedures  {Document cardiac monitor, telemetry assessment procedure when appropriate:1}  Medications Ordered in ED Medications  HYDROmorphone (DILAUDID) injection 1 mg (1 mg Intravenous Given 02/22/23 1339)  ondansetron (ZOFRAN) injection 4 mg (4 mg Intravenous Given 02/22/23 1339)    ED Course/ Medical Decision Making/ A&P   {   Click here for ABCD2, HEART and other calculatorsREFRESH Note before signing :1}                          Medical Decision Making Amount and/or Complexity of Data Reviewed Radiology: ordered.  Risk Prescription drug management.     {Document critical care  time when appropriate:1} {Document review of labs and clinical decision tools ie heart score, Chads2Vasc2 etc:1}  {Document your independent review of radiology images, and any outside records:1} {Document your discussion with family members, caretakers, and with consultants:1} {Document social determinants of health affecting pt's care:1} {Document your decision making why or why not admission, treatments were needed:1} Final Clinical Impression(s) / ED Diagnoses Final diagnoses:  None    Rx / DC Orders ED Discharge Orders     None

## 2023-02-26 ENCOUNTER — Other Ambulatory Visit: Payer: Self-pay

## 2023-02-26 ENCOUNTER — Encounter (HOSPITAL_COMMUNITY): Payer: Self-pay

## 2023-02-26 ENCOUNTER — Emergency Department (HOSPITAL_COMMUNITY)
Admission: EM | Admit: 2023-02-26 | Discharge: 2023-02-26 | Disposition: A | Discharge status: Home / Self Care | Payer: Medicare Other | Attending: Emergency Medicine | Admitting: Emergency Medicine

## 2023-02-26 DIAGNOSIS — Z85830 Personal history of malignant neoplasm of bone: Secondary | ICD-10-CM | POA: Insufficient documentation

## 2023-02-26 DIAGNOSIS — Z7982 Long term (current) use of aspirin: Secondary | ICD-10-CM | POA: Diagnosis not present

## 2023-02-26 DIAGNOSIS — M79604 Pain in right leg: Secondary | ICD-10-CM | POA: Insufficient documentation

## 2023-02-26 DIAGNOSIS — M545 Low back pain, unspecified: Secondary | ICD-10-CM | POA: Diagnosis present

## 2023-02-26 DIAGNOSIS — G8929 Other chronic pain: Secondary | ICD-10-CM | POA: Insufficient documentation

## 2023-02-26 DIAGNOSIS — Z7902 Long term (current) use of antithrombotics/antiplatelets: Secondary | ICD-10-CM | POA: Diagnosis not present

## 2023-02-26 DIAGNOSIS — R52 Pain, unspecified: Secondary | ICD-10-CM

## 2023-02-26 DIAGNOSIS — Z853 Personal history of malignant neoplasm of breast: Secondary | ICD-10-CM | POA: Diagnosis not present

## 2023-02-26 MED ORDER — FENTANYL CITRATE PF 50 MCG/ML IJ SOSY
50.0000 ug | PREFILLED_SYRINGE | Freq: Once | INTRAMUSCULAR | Status: AC
  Administered 2023-02-26: 50 ug via INTRAVENOUS
  Filled 2023-02-26: qty 1

## 2023-02-26 MED ORDER — HYDROMORPHONE HCL 1 MG/ML IJ SOLN
1.0000 mg | Freq: Once | INTRAMUSCULAR | Status: AC
  Administered 2023-02-26: 1 mg via INTRAVENOUS
  Filled 2023-02-26: qty 1

## 2023-02-26 MED ORDER — HYDROMORPHONE HCL 2 MG PO TABS: 2.0000 mg | ORAL_TABLET | Freq: Four times a day (QID) | ORAL | 0 refills | Status: DC | PRN

## 2023-02-26 NOTE — ED Provider Notes (Signed)
[facility] Provider Note   CSN: [id number] Arrival date & time: 02/26/23  1746     History  Chief Complaint  Patient presents with   Pain    chronic    [name] is a 77 y.o. femalewith a history most significant for metastatic breast cancer, prior bony metastasis, lumbar spinal stenosis and known L1 compression fracture presenting for intractable pain.  Patient was seen 4 days ago she was given Dilaudid and Lidoderm patch which improved her pain.  Patient states that since that time she has had no relief despite the fact that she has an intrathecal morphine pump and takes 10 mg oxycodone 3 times a day.  Patient states that anytime she moves she has severe pain radiating down her leg that "sends me into the orbit."  She states she has not been able to sleep due to the severity of her pain.  She is extremely tearful.  She is bedbound at baseline and her son cares for her.  She states that she was being considered for surgery however she was declined for surgery due to high risk due to her cardiac issues.  Patient states she is currently in limbo and does not have a follow-up appointment until April 4 but also is having trouble with transportation because they can only take her to [facility] on the third of the fifth.  Patient states that she just does not know what to do because her pain is so severe.  She is going to [facility] to find out if the intrathecal pump is actually working appropriately.  HPI     Home Medications Prior to Admission medications   Medication Sig Start Date End Date Taking? Authorizing Provider  acetaminophen (TYLENOL) 325 MG tablet Take 2 tablets (650 mg total) by mouth every 6 (six) hours as needed for mild pain, fever or headache. 04/05/21   Roxan Hockey, MD  amLODipine (NORVASC) 5 MG tablet TAKE 1 TABLET (5 MG TOTAL) BY MOUTH DAILY 01/27/22   Arnoldo Lenis, MD  aspirin EC 81 MG tablet Take 1 tablet (81 mg total) by  mouth daily with breakfast. Swallow whole. 04/07/21   Roxan Hockey, MD  atorvastatin (LIPITOR) 80 MG tablet TAKE 1 TABLET EVERY MORNING 09/15/22   Arnoldo Lenis, MD  b complex vitamins tablet Take 1 tablet by mouth every morning. 07/05/20   Florencia Reasons, MD  carboxymethylcellulose (REFRESH PLUS) 0.5 % SOLN Place 1 drop into both eyes as needed.    [provider]  clopidogrel (PLAVIX) 75 MG tablet TAKE 1 TABLET EVERY DAY 09/15/22   Arnoldo Lenis, MD  DULoxetine (CYMBALTA) 30 MG capsule Take 3 capsules (90 mg total) by mouth daily. Patient taking differently: Take 30 mg by mouth 2 (two) times daily. 07/06/20   Florencia Reasons, MD  ferrous sulfate 325 (65 FE) MG tablet Take 325 mg by mouth daily.    [provider]  losartan (COZAAR) 100 MG tablet TAKE 1 TABLET EVERY DAY 09/10/22   Arnoldo Lenis, MD  metoprolol succinate (TOPROL-XL) 100 MG 24 hr tablet TAKE 1 AND 1/2 TABLETS EVERY DAY. DOSE INCREASE. 06/13/22   Arnoldo Lenis, MD  Multiple Vitamin (MULTIVITAMIN ADULT PO) Take 1 tablet by mouth daily.    [provider]  Multiple Vitamins-Minerals (EQ VISION FORMULA 50+ PO) Take 1 tablet by mouth every morning.    [provider]  naloxone [facility]) nasal spray 4 mg/0.1 mL SMARTSIG:Both Nares  [provider]  nitroGLYCERIN (NITROSTAT) 0.4 MG SL tablet Place 1 tablet (0.4 mg total) under the tongue every 5 (five) minutes x 3 doses as needed for chest pain (if no relief after 3rd dose, proceed to the ED or call 911). 07/11/21   Arnoldo Lenis, MD  oxyCODONE (OXY IR/ROXICODONE) 5 MG immediate release tablet Take by mouth. 10/31/22   [provider]  Oxycodone HCl 10 MG TABS Take 10 mg by mouth 3 (three) times daily as needed. 08/26/22   [provider]  PAIN MANAGEMENT INTRATHECAL, IT, PUMP by Intrathecal route. 02/21/21   [provider]  pregabalin (LYRICA) 50 MG capsule Take 50 mg by mouth 3 (three) times daily. 01/26/21    [provider]      Allergies    Brilinta [ticagrelor], Budesonide, and Albuterol    Review of Systems   Review of Systems  Physical Exam Updated Vital Signs BP (!) 166/80   Pulse 88   Temp 98.3 F (36.8 C) (Oral)   Resp 17   Ht 5' (1.524 m)   Wt 93 kg   SpO2 99%   BMI 40.04 kg/m  Physical Exam Vitals and nursing note reviewed.  Constitutional:      General: She is not in acute distress.    Appearance: She is well-developed. She is not diaphoretic.  HENT:     Head: Normocephalic and atraumatic.     Right Ear: External ear normal.     Left Ear: External ear normal.     Nose: Nose normal.     Mouth/Throat:     Mouth: Mucous membranes are moist.  Eyes:     General: No scleral icterus.    Conjunctiva/sclera: Conjunctivae normal.  Cardiovascular:     Rate and Rhythm: Normal rate and regular rhythm.     Heart sounds: Normal heart sounds. No murmur heard.    No friction rub. No gallop.  Pulmonary:     Effort: Pulmonary effort is normal. No respiratory distress.     Breath sounds: Normal breath sounds.  Abdominal:     General: Bowel sounds are normal. There is no distension.     Palpations: Abdomen is soft. There is no mass.     Tenderness: There is no abdominal tenderness. There is no guarding.  Musculoskeletal:     Cervical back: Normal range of motion.     Comments: Severe pain shooting down the right leg with any movement in the bed normal strength at the ankle unable to tolerate any further examination due to severe pain DP/PT pulse 1+ bilaterally  Skin:    General: Skin is warm and dry.  Neurological:     Mental Status: She is alert and oriented to person, place, and time.     Deep Tendon Reflexes: Reflexes normal.  Psychiatric:        Mood and Affect: Mood is anxious. Affect is tearful.        Behavior: Behavior normal.     ED Results / Procedures / Treatments   Labs (all labs ordered are listed, but only abnormal results are displayed) Labs  Reviewed - No data to display  EKG None  Radiology No results found.  Procedures Procedures    Medications Ordered in ED Medications  HYDROmorphone (DILAUDID) injection 1 mg (has no administration in time range)    ED Course/ Medical Decision Making/ A&P  Medical Decision Making This is a 77 year old female with acute on chronic pain exacerbation.  She was seen for the same thing 4 days ago and states that she was given a dose of Dilaudid which is the only thing that allowed her to get to sleep for a single night. Comorbidities affecting care include obesity, chronic pain , known severe radiculopathy Social determinants of health considered in medical decision making include patient nonambulatory, patient's pain clinic and care team are in another county, she has severe and limited mobility requiring specialized transportation out of [place]  Patient given a dose of Dilaudid here in the emergency department with significant improvement in her pain at rest and limited improvement with any movement.  After discussion with Dr. Roderic Palau and the patient as well as attempts to contact her pain management physician I decided to write the patient for a few tablets of oral Dilaudid.  This is in consideration for the fact that she reports her plan is to be evaluated to see if her intrathecal pump is actually working or not.  This is the patient's second visit to the emergency department for severe intractable pain.  She is not a surgical candidate.  I discussed with the pat patient that it would be better for her to come to the emergency department during business hours on the weekdays when we could actually call and discuss plan of care with her pain management physician considering all of the complicating factors in this case. Patient is under pain contract.  She understands that she is not to pick the prescription up until she calls and has a discussion with her pain  management doctor, Dr. Sheria Lang. She has had no new traumatic events and I do not think that further imaging will help and medical evaluation.  Patient's pain is improved.  She will be discharged home.  Discussed outpatient follow-up and strict return precautions.  Risk Prescription drug management.           Final Clinical Impression(s) / ED Diagnoses Final diagnoses:  None    Rx / DC Orders ED Discharge Orders     None         Margarita Mail, PA-C 02/27/23 ID:[id number]    Milton Ferguson, MD 02/28/23 1229

## 2023-02-26 NOTE — ED Notes (Signed)
Called son, Marissa Nestle, to advise pt would be transported home. Unsure of time frame.

## 2023-02-26 NOTE — ED Triage Notes (Addendum)
Pt bib EMS from home with c/o lower back pain radiating down right leg, pt seen on 3/24 for the same, pt is under chronic pain mangt. Has implanted morphine pump and takes 10mg  oxy 3 times a day-pt says this isn't controlling her pain. Pt is non-ambulatory.

## 2023-02-26 NOTE — Discharge Instructions (Signed)
Please call your pain management doctor first thing tomorrow to discuss the fact that you have been to the emergency department already 2 times for severe pain and that I wrote you a prescription for 2 mg Dilaudid for severe breakthrough pain to take at home.  Do not pick the prescription up until you have discussed it and okayed it with your physician.  If you are unable to pick this prescription up due to your pain contract that is okay.  Call the pharmacy and cancel the prescription.  If you need to return to the emergency department for severe pain tried to return during business hours hours so that we can have a discussion with your pain management physician.  Help right away for any new or worsening symptoms

## 2023-02-26 NOTE — ED Notes (Signed)
Patient states right side pain from the hip to the leg.

## 2023-02-27 ENCOUNTER — Other Ambulatory Visit: Payer: Self-pay

## 2023-02-27 DIAGNOSIS — C50911 Malignant neoplasm of unspecified site of right female breast: Secondary | ICD-10-CM

## 2023-03-02 ENCOUNTER — Inpatient Hospital Stay: Payer: Medicare Other | Attending: Hematology

## 2023-03-02 ENCOUNTER — Inpatient Hospital Stay: Payer: Medicare Other

## 2023-03-02 VITALS — BP 143/66 | HR 62 | Temp 98.0°F | Resp 20

## 2023-03-02 DIAGNOSIS — C50411 Malignant neoplasm of upper-outer quadrant of right female breast: Secondary | ICD-10-CM | POA: Insufficient documentation

## 2023-03-02 DIAGNOSIS — Z170 Estrogen receptor positive status [ER+]: Secondary | ICD-10-CM | POA: Diagnosis not present

## 2023-03-02 DIAGNOSIS — Z5111 Encounter for antineoplastic chemotherapy: Secondary | ICD-10-CM | POA: Insufficient documentation

## 2023-03-02 DIAGNOSIS — C7951 Secondary malignant neoplasm of bone: Secondary | ICD-10-CM | POA: Insufficient documentation

## 2023-03-02 DIAGNOSIS — C50911 Malignant neoplasm of unspecified site of right female breast: Secondary | ICD-10-CM | POA: Diagnosis present

## 2023-03-02 DIAGNOSIS — C773 Secondary and unspecified malignant neoplasm of axilla and upper limb lymph nodes: Secondary | ICD-10-CM | POA: Insufficient documentation

## 2023-03-02 LAB — COMPREHENSIVE METABOLIC PANEL
ALT: 15 U/L (ref 0–44)
AST: 21 U/L (ref 15–41)
Albumin: 3.2 g/dL — ABNORMAL LOW (ref 3.5–5.0)
Alkaline Phosphatase: 32 U/L — ABNORMAL LOW (ref 38–126)
Anion gap: 6 (ref 5–15)
BUN: 22 mg/dL (ref 8–23)
CO2: 31 mmol/L (ref 22–32)
Calcium: 9 mg/dL (ref 8.9–10.3)
Chloride: 103 mmol/L (ref 98–111)
Creatinine, Ser: 0.66 mg/dL (ref 0.44–1.00)
GFR, Estimated: >60 mL/min (ref >60)
Glucose, Bld: 100 mg/dL — ABNORMAL HIGH (ref 70–99)
Potassium: 4.6 mmol/L (ref 3.5–5.1)
Sodium: 140 mmol/L (ref 135–145)
Total Bilirubin: 0.8 mg/dL (ref 0.3–1.2)
Total Protein: 6 g/dL — ABNORMAL LOW (ref 6.5–8.1)

## 2023-03-02 LAB — CBC WITH DIFFERENTIAL/PLATELET
Abs Immature Granulocytes: 0.03 10*3/uL (ref 0.00–0.07)
Basophils Absolute: 0 10*3/uL (ref 0.0–0.1)
Basophils Relative: 1 %
Eosinophils Absolute: 0.1 10*3/uL (ref 0.0–0.5)
Eosinophils Relative: 3 %
HCT: 32.5 % — ABNORMAL LOW (ref 36.0–46.0)
Hemoglobin: 10.7 g/dL — ABNORMAL LOW (ref 12.0–15.0)
Immature Granulocytes: 1 %
Lymphocytes Relative: 28 %
Lymphs Abs: 1.3 10*3/uL (ref 0.7–4.0)
MCH: 34.5 pg — ABNORMAL HIGH (ref 26.0–34.0)
MCHC: 32.9 g/dL (ref 30.0–36.0)
MCV: 104.8 fL — ABNORMAL HIGH (ref 80.0–100.0)
Monocytes Absolute: 0.4 10*3/uL (ref 0.1–1.0)
Monocytes Relative: 9 %
Neutro Abs: 2.6 10*3/uL (ref 1.7–7.7)
Neutrophils Relative %: 58 %
Platelets: 179 10*3/uL (ref 150–400)
RBC: 3.1 MIL/uL — ABNORMAL LOW (ref 3.87–5.11)
RDW: 13.5 % (ref 11.5–15.5)
WBC: 4.4 10*3/uL (ref 4.0–10.5)
nRBC: 0 % (ref 0.0–0.2)

## 2023-03-02 LAB — MAGNESIUM: Magnesium: 2 mg/dL (ref 1.7–2.4)

## 2023-03-02 MED ORDER — FULVESTRANT 250 MG/5ML IM SOSY
500.0000 mg | PREFILLED_SYRINGE | Freq: Once | INTRAMUSCULAR | Status: AC
  Administered 2023-03-02: 500 mg via INTRAMUSCULAR
  Filled 2023-03-02: qty 10

## 2023-03-02 MED ORDER — DENOSUMAB 120 MG/1.7ML ~~LOC~~ SOLN
120.0000 mg | Freq: Once | SUBCUTANEOUS | Status: AC
  Administered 2023-03-02: 120 mg via SUBCUTANEOUS
  Filled 2023-03-02: qty 1.7

## 2023-03-02 NOTE — Progress Notes (Signed)
Patient tolerated Faslodex injection with no complaints voiced. Bilateral sites clean and dry with no bruising or swelling noted. Band aids applied. See MAR for details. Patient stable during and after injections.    Patient taking calcium as directed. Denied tooth, jaw, and leg pain. No recent or upcoming dental visits. Labs reviewed. Patient tolerated injection with no complaints voiced. See MAR for details. Patient stable during and after injection. Site clean and dry with no bruising or swelling noted. Band aid applied. Vss with discharge and left in satisfactory condition with no s/s of distress.   

## 2023-03-02 NOTE — Patient Instructions (Signed)
[facility]  Discharge Instructions: Thank you for choosing [facility] to provide your oncology and hematology care.  If you have a lab appointment with the [facility], please come in thru the Main Entrance and check in at the main information desk.  Wear comfortable clothing and clothing appropriate for easy access to any Portacath or PICC line.   We strive to give you quality time with your provider. You may need to reschedule your appointment if you arrive late (15 or more minutes).  Arriving late affects you and other patients whose appointments are after yours.  Also, if you miss three or more appointments without notifying the office, you may be dismissed from the clinic at the provider's discretion.      For prescription refill requests, have your pharmacy contact our office and allow 72 hours for refills to be completed.    Today you received the following Xgeva and Faslodex, return as scheduled.   To help prevent nausea and vomiting after your treatment, we encourage you to take your nausea medication as directed.  BELOW ARE SYMPTOMS THAT SHOULD BE REPORTED IMMEDIATELY: *FEVER GREATER THAN 100.4 F (38 C) OR HIGHER *CHILLS OR SWEATING *NAUSEA AND VOMITING THAT IS NOT CONTROLLED WITH YOUR NAUSEA MEDICATION *UNUSUAL SHORTNESS OF BREATH *UNUSUAL BRUISING OR BLEEDING *URINARY PROBLEMS (pain or burning when urinating, or frequent urination) *BOWEL PROBLEMS (unusual diarrhea, constipation, pain near the anus) TENDERNESS IN MOUTH AND THROAT WITH OR WITHOUT PRESENCE OF ULCERS (sore throat, sores in mouth, or a toothache) UNUSUAL RASH, SWELLING OR PAIN  UNUSUAL VAGINAL DISCHARGE OR ITCHING   Items with * indicate a potential emergency and should be followed up as soon as possible or go to the Emergency Department if any problems should occur.  Please show the CHEMOTHERAPY ALERT CARD or IMMUNOTHERAPY ALERT CARD at check-in to the Emergency Department  and triage nurse.  Should you have questions after your visit or need to cancel or reschedule your appointment, please contact [facility] [phone]  and follow the prompts.  Office hours are 8:00 a.m. to 4:30 p.m. Monday - Friday. Please note that voicemails left after 4:00 p.m. may not be returned until the following business day.  We are closed weekends and major holidays. You have access to a nurse at all times for urgent questions. Please call the main number to the clinic [phone] and follow the prompts.  For any non-urgent questions, you may also contact your provider using MyChart. We now offer e-Visits for anyone 59 and older to request care online for non-urgent symptoms. For details visit [email or web address].   Also download the MyChart app! Go to the app store, search "MyChart", open the app, select [facility], and log in with your MyChart username and password.

## 2023-03-03 LAB — CANCER ANTIGEN 15-3: CA 15-3: 18 U/mL (ref 0.0–25.0)

## 2023-03-04 LAB — CANCER ANTIGEN 27.29: CA 27.29: 15.3 U/mL (ref 0.0–38.6)

## 2023-03-09 ENCOUNTER — Inpatient Hospital Stay (HOSPITAL_BASED_OUTPATIENT_CLINIC_OR_DEPARTMENT_OTHER): Payer: Medicare Other | Admitting: Hematology

## 2023-03-09 VITALS — BP 132/70 | HR 69 | Temp 98.6°F | Resp 16

## 2023-03-09 DIAGNOSIS — C50911 Malignant neoplasm of unspecified site of right female breast: Secondary | ICD-10-CM

## 2023-03-09 DIAGNOSIS — C50411 Malignant neoplasm of upper-outer quadrant of right female breast: Secondary | ICD-10-CM | POA: Diagnosis not present

## 2023-03-09 DIAGNOSIS — Z170 Estrogen receptor positive status [ER+]: Secondary | ICD-10-CM | POA: Diagnosis not present

## 2023-03-09 DIAGNOSIS — Z5111 Encounter for antineoplastic chemotherapy: Secondary | ICD-10-CM | POA: Diagnosis not present

## 2023-03-09 NOTE — Progress Notes (Signed)
Patient has been assessed, vital signs and labs have been reviewed by Dr. Katragadda. ANC, Creatinine, LFTs, and Platelets are within treatment parameters per Dr. Katragadda. The patient is good to proceed with Faslodex treatment at this time.  Primary RN and pharmacy aware.  

## 2023-03-09 NOTE — Progress Notes (Signed)
[facility] [address] [place] [address]    Clinic Day:  03/09/2023  Referring physician: Ignatius Specking, MD  Patient Care Team: Ignatius Specking, MD as PCP - General (Internal Medicine) Wyline Mood Dorothe Pea, MD as PCP - Cardiology (Cardiology) Therese Sarah, RN as Oncology Nurse Navigator (Oncology)   ASSESSMENT & PLAN:   Assessment: 1.  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 -Ultrasound-guided biopsy on 01/07/2016 showed grade 1 invasive ductal carcinoma, ER positive, PR positive, HER-2 equivocal by FISH, right axillary lymph node with metastatic adenocarcinoma.  HER-2 equivocal since her 2/CEP17 ratio less than 2 and average copy number of her 2 is greater than or equal to 4 but less than 6 signals per cell. -Bone biopsy on 05/30/2016 consistent with metastatic breast cancer. -Palliative therapy with letrozole started under the direction of Dr. Roland Earl at [facility] from 02/25/2016 through February 2020, progressive symptoms with increased soft tissue extension at L2. 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 -Addition of Ilda Foil was discussed but she was concerned about cost of the medication. -CT abdomen and pelvis on 08/13/2020 shows acute compression fracture deformity of the L1 vertebral body and other chronic findings. -Her last Faslodex injection at [facility] was on 11/06/2020. -CT CAP on 02/19/2021 with no evidence of for solid organ metastasis or nodal metastasis.  Right breast lesion is stable.  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 -Bone scan on 02/19/2021 was negative for bone mets.   2.  Social/family history: -She worked for [employer] prior to retirement in 2010. -She is  currently living with her son who is helping with her day-to-day activities.  Daughter lives in [place].  She quit smoking 35 to 40 years ago. -She is mostly confined to bed.  However she is able to change clothes, able to wash upper body, halfway dress herself.  She cannot stand up for the past 2 years because of weakness in the legs and knee arthritis.  She wears adult diapers.  Plan: 1.  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 - Physical exam today shows right upper outer quadrant mass 3 cm palpable which is slightly tender. - Reviewed CT lumbar spine from 02/22/2023: Large posterior projecting fragment associated with L1 compression deformity similar to previous x-rays and MRI.  Sclerosis of L2 also stable. - Labs from 03/02/2023: CA 15-3 and CA 27-29 normal.  LFTs normal.  CBC grossly normal. - Continue Faslodex monthly.  RTC 3 months with repeat CT CAP.     2.  Cancer associated pain/RLQ pain: - She has morphine/bupivacaine intrathecal pump.  She reports that pain is poorly controlled.  Continue follow-up with [facility] pain management clinic.  She has an appointment with them.  3.  Left kidney upper pole mass: - CT CAP showed 3.3 x 2.7 x 2.8 cm left upper lobe kidney mass previously 2.6 x 2.2 x 2.2 cm on 06/09/2022. - She is reportedly seeing GU oncology at [facility].  4.  Bone metastasis: - Calcium is normal at 9.0.  Continue monthly denosumab.  5.  Macrocytic anemia: - Hemoglobin 10.7 today.  Continue iron tablet daily.  Orders Placed This Encounter  Procedures   Cancer antigen 15-3    Standing Status:   Future    Standing Expiration Date:   03/08/2024   Cancer antigen 27.29    Standing Status:   Future    Standing Expiration Date:   03/08/2024    I,Alexis Herring,acting as a scribe for [name], MD.,have documented all relevant documentation on the behalf of [name], MD,as directed by  [name], MD while in the presence of [name], MD.  I, [name] MD, have reviewed the above documentation for accuracy and completeness, and I agree with the above.   [name], MD   4/8/20246:02 PM  CHIEF COMPLAINT:   Diagnosis: metastatic right breast cancer to the bones    Cancer Staging  Metastatic breast cancer Staging form: Breast, AJCC 8th Edition - Clinical stage from 02/07/2021: Stage IV (cT2, cN1, pM1, G1, ER+, PR+, HER2: Equivocal) - Signed by [name], MD on 02/07/2021    Prior Therapy:  Palliative therapy with letrozole started under the direction of Dr. Roland Earl at [facility] from 02/25/2016 through February 2020   Current Therapy:  She was transitioned to fulvestrant in February 2020- still receiving monthly Faslodex   HISTORY OF PRESENT ILLNESS:   Oncology History  Metastatic breast cancer  11/23/2017 Initial Diagnosis   Metastatic breast cancer (HCC)   02/07/2021 Cancer Staging   Staging form: Breast, AJCC 8th Edition - Clinical stage from 02/07/2021: Stage IV (cT2, cN1, pM1, G1, ER+, PR+, HER2: Equivocal) - Signed by [name], MD on 02/07/2021 Histopathologic type: Infiltrating duct carcinoma, NOS Stage prefix: Initial diagnosis Histologic grading system: 3 grade system   02/07/2021 - 02/07/2021 Chemotherapy            INTERVAL HISTORY:   Stacey [name] is a 77 y.o. female presenting to clinic today for follow up of metastatic right breast cancer to the bones. She was last seen by me on 12/08/22.  She was treated in the ED on 3/24 and 3/28 for her low back pain. Today, she states that she continues to have severe back pain particularly at night making it difficult for her to sleep. She is using Dilaudid and her Morphine pain pump. Her appetite level is at 100%. Her energy level is at 0%. She states that she cannot sit up in her bed or perform her activities  independently.  She is scheduled to see pain medicine Dr. Lanice Shirts on 03/12/23. She last saw Dr. Cassandria Anger on 03/04/23. She reports increased tenderness of her right UOQ mass.  PAST MEDICAL HISTORY:   Past Medical History: Past Medical History:  Diagnosis Date   CAD in native artery    a. DES to ramus 2005 with late stent thrombosis 2006 tx with PTCA. 12/18 PCI/DES x1 to mRCA, EF 50-55%   CHF (congestive heart failure)    Chronic pain    COPD (chronic obstructive pulmonary disease)    Hyperlipidemia    Hypertension    Left bundle branch block    Lymphedema    Metastatic breast cancer    a. to bone.   MI (myocardial infarction)    Mild aortic stenosis 10/2017   Morbid obesity    PAF (paroxysmal atrial fibrillation)    PSVT (paroxysmal supraventricular tachycardia)    a. per [facility] notes, seen on event monitor in 2014.   Pulmonary nodules     Surgical History: Past Surgical History:  Procedure Laterality Date  CHOLECYSTECTOMY N/A 03/29/2021   Procedure: LAPAROSCOPIC CHOLECYSTECTOMY;  Surgeon: Lucretia RoersBridges, Lindsay C, MD;  Location: [facility];  Service: General;  Laterality: N/A;   CORONARY STENT INTERVENTION N/A 11/26/2017   Procedure: CORONARY STENT INTERVENTION;  Surgeon: SwazilandJordan, Peter M, MD;  [facility];  Service: Cardiovascular;  Laterality: N/A;   CORONARY STENT PLACEMENT     ERCP N/A 03/28/2021   Procedure: ENDOSCOPIC RETROGRADE CHOLANGIOPANCREATOGRAPHY (ERCP);  Surgeon: Malissa Hippoehman, Najeeb U, MD;  Location: [facility];  Service: Endoscopy;  Laterality: N/A;   intrathecal pain pump     LEFT HEART CATH AND CORONARY ANGIOGRAPHY N/A 11/26/2017   Procedure: LEFT HEART CATH AND CORONARY ANGIOGRAPHY;  Surgeon: SwazilandJordan, Peter M, MD;  [facility];  Service: Cardiovascular;  Laterality: N/A;   REMOVAL OF STONES  03/28/2021   Procedure: REMOVAL OF STONES;  Surgeon: Malissa Hippoehman, Najeeb U, MD;  Location: [facility];  Service: Endoscopy;;   SPHINCTEROTOMY  03/28/2021    Procedure: SPHINCTEROTOMY;  Surgeon: Malissa Hippoehman, Najeeb U, MD;  Location: [facility];  Service: Endoscopy;;   UMBILICAL HERNIA REPAIR N/A 04/03/2021   Procedure: ADULT PRIMARY UMBILICAL HERNIA REPAIR;  Surgeon: Lucretia RoersBridges, Lindsay C, MD;  Location: [facility];  Service: General;  Laterality: N/A;   VASCULAR SURGERY      Social History: Social History   Socioeconomic History   Marital status: Single    Spouse name: Not on file   Number of children: Not on file   Years of education: Not on file   Highest education level: Not on file  Occupational History   Occupation: Retired  Tobacco Use   Smoking status: Former    Packs/day: 2.00    Years: 18.00    Additional pack years: 0.00    Total pack years: 36.00    Types: Cigarettes    Quit date: 12/01/1980    Years since quitting: 42.2   Smokeless tobacco: Never  Vaping Use   Vaping Use: Never used  Substance and Sexual Activity   Alcohol use: No   Drug use: Yes    Types: Oxycodone, Morphine    Comment: takes methadone   Sexual activity: Not Currently  Other Topics Concern   Not on file  Social History Narrative   Not on file   Social Determinants of Health   Financial Resource Strain: Not on file  Food Insecurity: Not on file  Transportation Needs: No Transportation Needs (02/07/2021)   PRAPARE - Administrator, Civil ServiceTransportation    Lack of Transportation (Medical): No    Lack of Transportation (Non-Medical): No  Physical Activity: Inactive (02/07/2021)   Exercise Vital Sign    Days of Exercise per Week: 0 days    Minutes of Exercise per Session: 0 min  Stress: Not on file  Social Connections: Not on file  Intimate Partner Violence: Not At Risk (02/07/2021)   Humiliation, Afraid, Rape, and Kick questionnaire    Fear of Current or Ex-Partner: No    Emotionally Abused: No    Physically Abused: No    Sexually Abused: No    Family History: Family History  Problem Relation Age of Onset   CAD Father [id number]   Heart attack Father    COPD Sister    CAD  Paternal Grandmother    Sudden Cardiac Death Neg Hx    Colon polyps Neg Hx    Colon cancer Neg Hx     Current Medications:  Current Outpatient Medications:    acetaminophen (TYLENOL) 325 MG tablet, Take 2 tablets (  650 mg total) by mouth every 6 (six) hours as needed for mild pain, fever or headache., Disp: 12 tablet, Rfl: 0   amLODipine (NORVASC) 5 MG tablet, TAKE 1 TABLET (5 MG TOTAL) BY MOUTH DAILY, Disp: 90 tablet, Rfl: 2   aspirin EC 81 MG tablet, Take 1 tablet (81 mg total) by mouth daily with breakfast. Swallow whole., Disp: 30 tablet, Rfl: 11   atorvastatin (LIPITOR) 80 MG tablet, TAKE 1 TABLET EVERY MORNING, Disp: 90 tablet, Rfl: 1   b complex vitamins tablet, Take 1 tablet by mouth every morning., Disp: 30 tablet, Rfl: 0   carboxymethylcellulose (REFRESH PLUS) 0.5 % SOLN, Place 1 drop into both eyes as needed., Disp: , Rfl:    clopidogrel (PLAVIX) 75 MG tablet, TAKE 1 TABLET EVERY DAY, Disp: 90 tablet, Rfl: 1   DULoxetine (CYMBALTA) 30 MG capsule, Take 3 capsules (90 mg total) by mouth daily. (Patient taking differently: Take 30 mg by mouth 2 (two) times daily.), Disp: 30 capsule, Rfl: 0   ferrous sulfate 325 (65 FE) MG tablet, Take 325 mg by mouth daily., Disp: , Rfl:    HYDROmorphone (DILAUDID) 2 MG tablet, Take 1 tablet (2 mg total) by mouth every 6 (six) hours as needed for severe pain (breakthrough pain)., Disp: 15 tablet, Rfl: 0   losartan (COZAAR) 100 MG tablet, TAKE 1 TABLET EVERY DAY, Disp: 90 tablet, Rfl: 1   metoprolol succinate (TOPROL-XL) 100 MG 24 hr tablet, TAKE 1 AND 1/2 TABLETS EVERY DAY. DOSE INCREASE., Disp: 135 tablet, Rfl: 2   Multiple Vitamin (MULTIVITAMIN ADULT PO), Take 1 tablet by mouth daily., Disp: , Rfl:    Multiple Vitamins-Minerals (EQ VISION FORMULA 50+ PO), Take 1 tablet by mouth every morning., Disp: , Rfl:    naloxone (NARCAN) nasal spray 4 mg/0.1 mL, Place into the nose., Disp: , Rfl:    oxyCODONE (OXY IR/ROXICODONE) 5 MG immediate release tablet,  Take 10 mg by mouth every 4 (four) hours as needed for severe pain., Disp: , Rfl:    oxyCODONE (OXY IR/ROXICODONE) 5 MG immediate release tablet, Take by mouth., Disp: , Rfl:    Oxycodone HCl 10 MG TABS, Take 10 mg by mouth 3 (three) times daily as needed., Disp: , Rfl:    PAIN MANAGEMENT INTRATHECAL, IT, PUMP, by Intrathecal route., Disp: , Rfl:    pregabalin (LYRICA) 50 MG capsule, Take 50 mg by mouth daily as needed (pain)., Disp: , Rfl:    nitroGLYCERIN (NITROSTAT) 0.4 MG SL tablet, Place 1 tablet (0.4 mg total) under the tongue every 5 (five) minutes x 3 doses as needed for chest pain (if no relief after 3rd dose, proceed to the ED or call 911). (Patient not taking: Reported on 03/09/2023), Disp: 25 tablet, Rfl: 3   Allergies: Allergies  Allergen Reactions   Brilinta [Ticagrelor] Diarrhea and Nausea Only    Nausea and severe diarrhea- general weakness. Patient says does not want to take again   Budesonide Palpitations   Albuterol Other (See Comments)    Heart racing     REVIEW OF SYSTEMS:   Review of Systems  Constitutional:  Positive for fatigue. Negative for appetite change, chills and fever.  HENT:   Negative for lump/mass, mouth sores, nosebleeds, sore throat and trouble swallowing.   Eyes:  Negative for eye problems.  Respiratory:  Negative for cough and shortness of breath.   Cardiovascular:  Negative for chest pain, leg swelling and palpitations.  Gastrointestinal:  Positive for constipation. Negative for abdominal pain,  diarrhea, nausea and vomiting.  Genitourinary:  Negative for bladder incontinence, difficulty urinating, dysuria, frequency, hematuria and nocturia.   Musculoskeletal:  Positive for arthralgias, back pain and myalgias. Negative for neck pain.  Skin:  Negative for itching and rash.  Neurological:  Positive for dizziness, extremity weakness and numbness (left leg). Negative for headaches.  Hematological:  Does not bruise/bleed easily.  Psychiatric/Behavioral:   Positive for sleep disturbance. Negative for depression and suicidal ideas. The patient is not nervous/anxious.   All other systems reviewed and are negative.    VITALS:   Blood pressure 132/70, pulse 69, temperature 98.6 F (37 C), temperature source Oral, resp. rate 16, SpO2 95 %.  Wt Readings from Last 3 Encounters:  02/26/23 205 lb 0.4 oz (93 kg)  02/22/23 205 lb 0.4 oz (93 kg)  05/09/22 205 lb (93 kg)    There is no height or weight on file to calculate BMI.  Performance status (ECOG): 4 - Bedbound  PHYSICAL EXAM:   Physical Exam Vitals and nursing note reviewed. Exam conducted with a chaperone present.  Constitutional:      Appearance: Normal appearance.  Cardiovascular:     Rate and Rhythm: Normal rate and regular rhythm.     Pulses: Normal pulses.     Heart sounds: Normal heart sounds.  Pulmonary:     Effort: Pulmonary effort is normal.     Breath sounds: Normal breath sounds.  Chest:  Breasts:    Right: Mass and tenderness present.     Comments: Right breast UOQ 3cm mass, stable, but tender Abdominal:     Palpations: Abdomen is soft. There is no hepatomegaly, splenomegaly or mass.     Tenderness: There is no abdominal tenderness.  Musculoskeletal:     Right lower leg: No edema.     Left lower leg: No edema.  Lymphadenopathy:     Cervical: No cervical adenopathy.     Right cervical: No superficial, deep or posterior cervical adenopathy.    Left cervical: No superficial, deep or posterior cervical adenopathy.     Upper Body:     Right upper body: No supraclavicular or axillary adenopathy.     Left upper body: No supraclavicular or axillary adenopathy.  Neurological:     General: No focal deficit present.     Mental Status: She is alert and oriented to person, place, and time.  Psychiatric:        Mood and Affect: Mood normal.        Behavior: Behavior normal.    Breast Exam Chaperone: Anne Fu, LPN   LABS:      Latest Ref Rng & Units 03/02/2023     2:01 PM 02/02/2023    1:47 PM 12/08/2022   12:50 PM  CBC  WBC 4.0 - 10.5 K/uL 4.4  4.2  4.8   Hemoglobin 12.0 - 15.0 g/dL 16.1  09.6  04.5   Hematocrit 36.0 - 46.0 % 32.5  34.7  34.5   Platelets 150 - 400 K/uL 179  181  202       Latest Ref Rng & Units 03/02/2023    2:01 PM 02/02/2023    1:47 PM 01/06/2023    8:58 AM  CMP  Glucose 70 - 99 mg/dL 409  811  914   BUN 8 - 23 mg/dL 22  19  19    Creatinine 0.44 - 1.00 mg/dL 7.82  9.56  2.13   Sodium 135 - 145 mmol/L 140  137  141  Potassium 3.5 - 5.1 mmol/L 4.6  4.4  4.3   Chloride 98 - 111 mmol/L 103  100  103   CO2 22 - 32 mmol/L 31  31  29    Calcium 8.9 - 10.3 mg/dL 9.0  9.4  8.6   Total Protein 6.5 - 8.1 g/dL 6.0  6.4  5.9   Total Bilirubin 0.3 - 1.2 mg/dL 0.8  0.9  1.2   Alkaline Phos 38 - 126 U/L 32  40  38   AST 15 - 41 U/L 21  23  24    ALT 0 - 44 U/L 15  17  16     Component     Latest Ref Rng 09/09/2022 12/08/2022 03/02/2023  CA 15-3     0.0 - 25.0 U/mL 18.0  19.8  18.0     Component     Latest Ref Rng 09/09/2022 12/08/2022 03/02/2023  CA 27.29     0.0 - 38.6 U/mL 21.2  17.9  15.3     No results found for: "CEA1", "CEA" / No results found for: "CEA1", "CEA" No results found for: "PSA1" No results found for: "UJW119" No results found for: "CAN125"  No results found for: "TOTALPROTELP", "ALBUMINELP", "A1GS", "A2GS", "BETS", "BETA2SER", "GAMS", "MSPIKE", "SPEI" Lab Results  Component Value Date   TIBC 273 09/09/2022   TIBC 289 06/09/2022   TIBC 274 10/01/2021   FERRITIN 47 09/09/2022   FERRITIN 36 06/09/2022   FERRITIN 67 10/01/2021   IRONPCTSAT 25 09/09/2022   IRONPCTSAT 23 06/09/2022   IRONPCTSAT 20 10/01/2021   No results found for: "LDH"   STUDIES:   CT Lumbar Spine Wo Contrast  Result Date: 02/22/2023 CLINICAL DATA:  Breast cancer.  Known compression fractures. EXAM: CT LUMBAR SPINE WITHOUT CONTRAST TECHNIQUE: Multidetector CT imaging of the lumbar spine was performed without intravenous contrast  administration. Multiplanar CT image reconstructions were also generated. RADIATION DOSE REDUCTION: This exam was performed according to the departmental dose-optimization program which includes automated exposure control, adjustment of the mA and/or kV according to patient size and/or use of iterative reconstruction technique. COMPARISON:  CT July 2023, MRI June 2023, x-ray October 2021. FINDINGS: Severe compression deformity identified at the L1 level. Sclerosis of the L2 suggests underlying osseous lesion. Large posterior projecting fragments at the L1 level resulting in narrowing of the spinal canal. Marginal osteophytes identified at each lumbar level. Grade 1 L5 anterolisthesis. Dense atheromatous calcification of the aorta noted. IMPRESSION: 1. Large posterior projecting fragment associated with an L1 compression deformity similar to previous x-rays and MRI. Underlying pathologic fracture suspected. 2. Sclerosis of L2 also concerning for underlying neoplastic etiology. 3. The extent of compromise of underlying cord or conus can be assessed with MRI if indicated. Electronically Signed   By: Layla Maw M.D.   On: 02/22/2023 16:55

## 2023-03-09 NOTE — Patient Instructions (Signed)
[facility]  Discharge Instructions  You were seen and examined today by Dr. Ellin Saba.  Dr. Ellin Saba discussed your most recent lab work and CT scan which revealed that everything looks good and stable.  Continue getting your Faslodex injection monthly.  Dr. Ellin Saba is going to repeat CT of your chest, abdomen, and pelvis.  Follow-up as scheduled in 3 months.    Thank you for choosing [facility] to provide your oncology and hematology care.   To afford each patient quality time with our provider, please arrive at least 15 minutes before your scheduled appointment time. You may need to reschedule your appointment if you arrive late (10 or more minutes). Arriving late affects you and other patients whose appointments are after yours.  Also, if you miss three or more appointments without notifying the office, you may be dismissed from the clinic at the provider's discretion.    Again, thank you for choosing [facility].  Our hope is that these requests will decrease the amount of time that you wait before being seen by our physicians.   If you have a lab appointment with the Cancer Center - please note that after April 8th, all labs will be drawn in the cancer center.  You do not have to check in or register with the main entrance as you have in the past but will complete your check-in at the cancer center.            _____________________________________________________________  Should you have questions after your visit to [facility] please contact our office at [phone] and follow the prompts.  Our office hours are 8:00 a.m. to 4:30 p.m. Monday - Thursday and 8:00 a.m. to 2:30 p.m. Friday.  Please note that voicemails left after 4:00 p.m. may not be returned until the following business day.  We are closed weekends and all major holidays.  You do have access to a nurse 24-7, just call the main  number to the clinic [phone] and do not press any options, hold on the line and a nurse will answer the phone.    For prescription refill requests, have your pharmacy contact our office and allow 72 hours.    Masks are no longer required in the cancer centers. If you would like for your care team to wear a mask while they are taking care of you, please let them know. You may have one support person who is at least 76 years old accompany you for your appointments.

## 2023-03-15 ENCOUNTER — Emergency Department (HOSPITAL_COMMUNITY)
Admission: EM | Admit: 2023-03-15 | Discharge: 2023-03-16 | Disposition: A | Discharge status: Other Acute Inpatient Hospital | Payer: Medicare Other | Attending: Emergency Medicine | Admitting: Emergency Medicine

## 2023-03-15 ENCOUNTER — Encounter (HOSPITAL_COMMUNITY): Payer: Self-pay

## 2023-03-15 ENCOUNTER — Other Ambulatory Visit: Payer: Self-pay

## 2023-03-15 DIAGNOSIS — R208 Other disturbances of skin sensation: Secondary | ICD-10-CM | POA: Insufficient documentation

## 2023-03-15 DIAGNOSIS — Z853 Personal history of malignant neoplasm of breast: Secondary | ICD-10-CM | POA: Insufficient documentation

## 2023-03-15 DIAGNOSIS — Z7982 Long term (current) use of aspirin: Secondary | ICD-10-CM | POA: Diagnosis not present

## 2023-03-15 DIAGNOSIS — Z7901 Long term (current) use of anticoagulants: Secondary | ICD-10-CM | POA: Insufficient documentation

## 2023-03-15 DIAGNOSIS — G8929 Other chronic pain: Secondary | ICD-10-CM | POA: Diagnosis not present

## 2023-03-15 DIAGNOSIS — M545 Low back pain, unspecified: Secondary | ICD-10-CM | POA: Insufficient documentation

## 2023-03-15 LAB — BASIC METABOLIC PANEL
Anion gap: 6 (ref 5–15)
BUN: 20 mg/dL (ref 8–23)
CO2: 32 mmol/L (ref 22–32)
Calcium: 9.1 mg/dL (ref 8.9–10.3)
Chloride: 102 mmol/L (ref 98–111)
Creatinine, Ser: 0.58 mg/dL (ref 0.44–1.00)
GFR, Estimated: >60 mL/min (ref >60)
Glucose, Bld: 110 mg/dL — ABNORMAL HIGH (ref 70–99)
Potassium: 4.3 mmol/L (ref 3.5–5.1)
Sodium: 140 mmol/L (ref 135–145)

## 2023-03-15 LAB — CBC
HCT: 35 % — ABNORMAL LOW (ref 36.0–46.0)
Hemoglobin: 11.4 g/dL — ABNORMAL LOW (ref 12.0–15.0)
MCH: 34.1 pg — ABNORMAL HIGH (ref 26.0–34.0)
MCHC: 32.6 g/dL (ref 30.0–36.0)
MCV: 104.8 fL — ABNORMAL HIGH (ref 80.0–100.0)
Platelets: 184 10*3/uL (ref 150–400)
RBC: 3.34 MIL/uL — ABNORMAL LOW (ref 3.87–5.11)
RDW: 13.3 % (ref 11.5–15.5)
WBC: 6.3 10*3/uL (ref 4.0–10.5)
nRBC: 0 % (ref 0.0–0.2)

## 2023-03-15 MED ORDER — HYDROMORPHONE HCL 1 MG/ML IJ SOLN
1.0000 mg | Freq: Once | INTRAMUSCULAR | Status: AC
  Administered 2023-03-15: 1 mg via INTRAVENOUS
  Filled 2023-03-15: qty 1

## 2023-03-15 NOTE — ED Provider Notes (Signed)
[facility] EMERGENCY DEPARTMENT AT [facility] Provider Note   CSN: [id number] Arrival date & time: 03/15/23  1834     History  Chief Complaint  Patient presents with   Back Pain    Stacey [name] is a 77 y.o. female.   Back Pain    This unfortunate 77 year old female has been bedbound for a year, she has a pain clinic that she sees at [facility] pain medicine and has an intrathecal pain pump in her spine for which she gets morphine.  She was actually seen several days ago, the dosing was changed and increased due to increasing amounts of pain.  She also has a known history of right breast neoplasm, she has been told that she is not a surgical candidate secondary to her poor cardiac function and chronic cancer and chronic pain.  She has not had lumbar surgery but has a known closed compression fracture of L1.  She is also known to have metastatic bone cancer from her breast.  She presents with increasing pain, she states that she has been unable to get out of bed for a year and her son cares for her full-time.  She takes Dilaudid 2 mg tablets throughout the day as well as oxycodone throughout the day, I have reviewed the PDMP website confirming the patient's medication use.  She states that her pain is increasing despite all of this.  No fevers, no urinary symptoms, no belly pain chest pain or shortness of breath.  Home Medications Prior to Admission medications   Medication Sig Start Date End Date Taking? Authorizing Provider  acetaminophen (TYLENOL) 325 MG tablet Take 2 tablets (650 mg total) by mouth every 6 (six) hours as needed for mild pain, fever or headache. 04/05/21   Shon Hale, MD  amLODipine (NORVASC) 5 MG tablet TAKE 1 TABLET (5 MG TOTAL) BY MOUTH DAILY 01/27/22   Antoine Poche, MD  aspirin EC 81 MG tablet Take 1 tablet (81 mg total) by mouth daily with breakfast. Swallow whole. 04/07/21   Shon Hale, MD  atorvastatin (LIPITOR) 80 MG tablet TAKE 1 TABLET  EVERY MORNING 09/15/22   Antoine Poche, MD  b complex vitamins tablet Take 1 tablet by mouth every morning. 07/05/20   Albertine Grates, MD  carboxymethylcellulose (REFRESH PLUS) 0.5 % SOLN Place 1 drop into both eyes as needed.    [provider]  clopidogrel (PLAVIX) 75 MG tablet TAKE 1 TABLET EVERY DAY 09/15/22   Antoine Poche, MD  DULoxetine (CYMBALTA) 30 MG capsule Take 3 capsules (90 mg total) by mouth daily. Patient taking differently: Take 30 mg by mouth 2 (two) times daily. 07/06/20   Albertine Grates, MD  ferrous sulfate 325 (65 FE) MG tablet Take 325 mg by mouth daily.    [provider]  HYDROmorphone (DILAUDID) 2 MG tablet Take 1 tablet (2 mg total) by mouth every 6 (six) hours as needed for severe pain (breakthrough pain). 02/26/23   Arthor Captain, PA-C  losartan (COZAAR) 100 MG tablet TAKE 1 TABLET EVERY DAY 09/10/22   Antoine Poche, MD  metoprolol succinate (TOPROL-XL) 100 MG 24 hr tablet TAKE 1 AND 1/2 TABLETS EVERY DAY. DOSE INCREASE. 06/13/22   Antoine Poche, MD  Multiple Vitamin (MULTIVITAMIN ADULT PO) Take 1 tablet by mouth daily.    [provider]  Multiple Vitamins-Minerals (EQ VISION FORMULA 50+ PO) Take 1 tablet by mouth every morning.    [provider]  naloxone [facility]) nasal  spray 4 mg/0.1 mL Place into the nose. 02/27/23   [provider]  nitroGLYCERIN (NITROSTAT) 0.4 MG SL tablet Place 1 tablet (0.4 mg total) under the tongue every 5 (five) minutes x 3 doses as needed for chest pain (if no relief after 3rd dose, proceed to the ED or call 911). Patient not taking: Reported on 03/09/2023 07/11/21   Antoine Poche, MD  oxyCODONE (OXY IR/ROXICODONE) 5 MG immediate release tablet Take 10 mg by mouth every 4 (four) hours as needed for severe pain. 10/31/22   [provider]  oxyCODONE (OXY IR/ROXICODONE) 5 MG immediate release tablet Take by mouth. 01/07/23   [provider]  Oxycodone HCl 10 MG TABS Take 10 mg by  mouth 3 (three) times daily as needed. 08/26/22   [provider]  PAIN MANAGEMENT INTRATHECAL, IT, PUMP by Intrathecal route. 02/21/21   [provider]  pregabalin (LYRICA) 50 MG capsule Take 50 mg by mouth daily as needed (pain). 01/26/21   [provider]      Allergies    Brilinta [ticagrelor], Budesonide, and Albuterol    Review of Systems   Review of Systems  Musculoskeletal:  Positive for back pain.  All other systems reviewed and are negative.   Physical Exam Updated Vital Signs BP (!) 134/113 (BP Location: Right Arm)   Pulse 69   Temp 98.2 F (36.8 C) (Oral)   Resp 19   Ht 1.676 m (5\' 6" )   Wt 97.5 kg   SpO2 100%   BMI 34.70 kg/m  Physical Exam Vitals and nursing note reviewed.  Constitutional:      General: She is not in acute distress.    Appearance: She is well-developed.  HENT:     Head: Normocephalic and atraumatic.     Mouth/Throat:     Pharynx: No oropharyngeal exudate.  Eyes:     General: No scleral icterus.       Right eye: No discharge.        Left eye: No discharge.     Conjunctiva/sclera: Conjunctivae normal.     Pupils: Pupils are equal, round, and reactive to light.  Neck:     Thyroid: No thyromegaly.     Vascular: No JVD.  Cardiovascular:     Rate and Rhythm: Normal rate and regular rhythm.     Heart sounds: Normal heart sounds. No murmur heard.    No friction rub. No gallop.  Pulmonary:     Effort: Pulmonary effort is normal. No respiratory distress.     Breath sounds: Normal breath sounds. No wheezing or rales.  Abdominal:     General: Bowel sounds are normal. There is no distension.     Palpations: Abdomen is soft. There is no mass.     Tenderness: There is no abdominal tenderness.  Musculoskeletal:        General: Tenderness present. Normal range of motion.     Cervical back: Normal range of motion and neck supple.     Comments: Lumbar tenderness  Lymphadenopathy:     Cervical: No cervical adenopathy.   Skin:    General: Skin is warm and dry.     Findings: No erythema or rash.  Neurological:     Mental Status: She is alert.     Coordination: Coordination normal.     Comments: The patient is not able to move the right leg at all and has decree sensation, she is able to move her toes on the left foot,  she cannot lift the left leg, she has decree sensation of the left leg.  Psychiatric:        Behavior: Behavior normal.     ED Results / Procedures / Treatments   Labs (all labs ordered are listed, but only abnormal results are displayed) Labs Reviewed  CBC - Abnormal; Notable for the following components:      Result Value   RBC 3.34 (*)    Hemoglobin 11.4 (*)    HCT 35.0 (*)    MCV 104.8 (*)    MCH 34.1 (*)    All other components within normal limits  BASIC METABOLIC PANEL - Abnormal; Notable for the following components:   Glucose, Bld 110 (*)    All other components within normal limits  URINALYSIS, ROUTINE W REFLEX MICROSCOPIC    EKG None  Radiology No results found.  Procedures Procedures    Medications Ordered in ED Medications  HYDROmorphone (DILAUDID) injection 1 mg (1 mg Intravenous Given 03/15/23 2020)    ED Course/ Medical Decision Making/ A&P Clinical Course as of 03/15/23 2310  Sun Mar 15, 2023  2300 I have discussed the care with Dr. Sheryle Hail with Neuro / Pain control at [facility] - he states that he would be happy to see the patient in the ED or as an inpatient - at this time I have asked the transfer center at [facility] to get [facility] of the medical service for possible transfer - the patient is getting some improvement with dilaudid but states that any movement is causing severe pain - she has usually had beteter pain control but since the pain pump changed with re: to the increased concentration of morphine she has had poor pain control -the lab workup has been unremarkable, urinalysis has yet to be collected, the patient likely needs to have evaluation of the pain  pump at [facility], the medical service is pending consultation.  Again Dr. Sheryle Hail has stated that he will see the patient tomorrow in consultation when the patient is transferred to [facility].  Likely needs to have the pain pump evaluated. [BM]    Clinical Course User Index [BM] Eber Hong, MD                             Medical Decision Making Amount and/or Complexity of Data Reviewed Labs: ordered.  Risk Prescription drug management.   This patient presents to the ED for concern of worsening back pain, this involves an extensive number of treatment options, and is a complaint that carries with it a high risk of complications and morbidity.  The differential diagnosis includes worsening metastatic cancer, there could be some compression over the spinal cord   Co morbidities that complicate the patient evaluation  Metastatic breast cancer   Additional history obtained:  Additional history obtained from electronic medical record External records from outside source obtained and reviewed including oncology notes from earlier this month as well as the pain clinic notes from April 11, 3 days ago.  It appears that was just for a pump refill, she was seen on 3 April at the same clinic, was complaining of the back pain which has been chronic, they reviewed the MRI from November which did show that she had an L1 compression fracture with spinal stenosis at that time, conservative therapy only, no surgical therapy as this patient is essentially paraplegic below the waist.  She has been for quite some time.  Workup: I do not think the patient would benefit from a large aggressive workup as she has had recent CT scans, MRIs within the last 4 to 5 months and none of this has essentially changed, even if she had progression of metastatic disease of her spine there would be no surgical intervention that would be of any benefit.  She has chronic pain, it seems to be gradually worsening, she is  already on large doses of opiates, she will be given some pain medication here but ultimately will need to follow-up with her pain clinic.   Cardiac Monitoring: / EKG:  The patient was maintained on a cardiac monitor.  I personally viewed and interpreted the cardiac monitored which showed an underlying rhythm of: Normal sinus rhythm   Consultations Obtained:  I requested consultation with the Pain control  Neuro doctor - Dr. Leonette Nutting,  and discussed lab and imaging findings as well as pertinent plan - they recommend: wants pt transferred to [facility] so he can address the pain pump.   Problem List / ED Course / Critical interventions / Medication management  Dilaudid given I ordered medication including pain meds  for back pain - stable and inmproved  Reevaluation of the patient after these medicines showed that the patient improved I have reviewed the patients home medicines and have made adjustments as needed   Social Determinants of Health:  Bedbound I discussed this with the patient's family member, her son, he states that the patient needs to be admitted for IV pain medicine and transfer to [facility] for better control of the pain.  I have attempted to contact the patient's pain clinic, there is no answer.   Test / Admission - Considered:  Needs transfer for pain pump manipulation. On dilaudid for breakthrough pain not helping Pain pump not working Labs stable VS stable         Final Clinical Impression(s) / ED Diagnoses Final diagnoses:  Chronic bilateral low back pain without sciatica     Eber Hong, MD 03/15/23 2310

## 2023-03-15 NOTE — ED Triage Notes (Signed)
PATIENT FROM HOME FOR CHRONIC BACK PAIN RELATED TO L1 COMPRESSION. PATIENT HAS A MORPHINE PUMP IN PLACE; DOSE INCREASED 3 DAYS AGO WITH NO RELIEF. PATIENT HAS HISTORY OF BREAST CANCER. UPON ARRIVAL TO ER, PATIENT IS ALERT AND ORIENTED; PAIN 10/10

## 2023-03-16 DIAGNOSIS — Z853 Personal history of malignant neoplasm of breast: Secondary | ICD-10-CM | POA: Diagnosis not present

## 2023-03-16 DIAGNOSIS — Z7901 Long term (current) use of anticoagulants: Secondary | ICD-10-CM | POA: Diagnosis not present

## 2023-03-16 DIAGNOSIS — M545 Low back pain, unspecified: Secondary | ICD-10-CM | POA: Diagnosis present

## 2023-03-16 DIAGNOSIS — Z7982 Long term (current) use of aspirin: Secondary | ICD-10-CM | POA: Diagnosis not present

## 2023-03-16 DIAGNOSIS — G8929 Other chronic pain: Secondary | ICD-10-CM | POA: Diagnosis not present

## 2023-03-16 DIAGNOSIS — R208 Other disturbances of skin sensation: Secondary | ICD-10-CM | POA: Diagnosis not present

## 2023-03-16 MED ORDER — HYDROMORPHONE HCL 1 MG/ML IJ SOLN
1.0000 mg | Freq: Once | INTRAMUSCULAR | Status: AC
  Administered 2023-03-16: 1 mg via INTRAVENOUS
  Filled 2023-03-16: qty 1

## 2023-03-27 ENCOUNTER — Other Ambulatory Visit: Payer: Self-pay

## 2023-03-27 DIAGNOSIS — C50911 Malignant neoplasm of unspecified site of right female breast: Secondary | ICD-10-CM

## 2023-03-30 ENCOUNTER — Inpatient Hospital Stay: Payer: Medicare Other

## 2023-04-07 ENCOUNTER — Encounter (HOSPITAL_COMMUNITY): Payer: Self-pay | Admitting: Radiology

## 2023-04-07 ENCOUNTER — Emergency Department (HOSPITAL_COMMUNITY)
Admission: EM | Admit: 2023-04-07 | Discharge: 2023-04-07 | Disposition: A | Discharge status: Home / Self Care | Payer: Medicare Other | Attending: Emergency Medicine | Admitting: Emergency Medicine

## 2023-04-07 ENCOUNTER — Other Ambulatory Visit: Payer: Self-pay

## 2023-04-07 ENCOUNTER — Emergency Department (HOSPITAL_COMMUNITY): Payer: Medicare Other

## 2023-04-07 DIAGNOSIS — G8929 Other chronic pain: Secondary | ICD-10-CM | POA: Insufficient documentation

## 2023-04-07 DIAGNOSIS — Z79899 Other long term (current) drug therapy: Secondary | ICD-10-CM | POA: Diagnosis not present

## 2023-04-07 DIAGNOSIS — M5442 Lumbago with sciatica, left side: Secondary | ICD-10-CM | POA: Diagnosis not present

## 2023-04-07 DIAGNOSIS — Z853 Personal history of malignant neoplasm of breast: Secondary | ICD-10-CM | POA: Diagnosis not present

## 2023-04-07 DIAGNOSIS — Z7982 Long term (current) use of aspirin: Secondary | ICD-10-CM | POA: Insufficient documentation

## 2023-04-07 DIAGNOSIS — M5441 Lumbago with sciatica, right side: Secondary | ICD-10-CM | POA: Insufficient documentation

## 2023-04-07 DIAGNOSIS — M545 Low back pain, unspecified: Secondary | ICD-10-CM | POA: Diagnosis present

## 2023-04-07 DIAGNOSIS — I2510 Atherosclerotic heart disease of native coronary artery without angina pectoris: Secondary | ICD-10-CM | POA: Diagnosis not present

## 2023-04-07 DIAGNOSIS — I509 Heart failure, unspecified: Secondary | ICD-10-CM | POA: Insufficient documentation

## 2023-04-07 DIAGNOSIS — J449 Chronic obstructive pulmonary disease, unspecified: Secondary | ICD-10-CM | POA: Insufficient documentation

## 2023-04-07 DIAGNOSIS — I110 Hypertensive heart disease with heart failure: Secondary | ICD-10-CM | POA: Diagnosis not present

## 2023-04-07 LAB — BASIC METABOLIC PANEL
Anion gap: 9 (ref 5–15)
BUN: 22 mg/dL (ref 8–23)
CO2: 30 mmol/L (ref 22–32)
Calcium: 10 mg/dL (ref 8.9–10.3)
Chloride: 99 mmol/L (ref 98–111)
Creatinine, Ser: 0.7 mg/dL (ref 0.44–1.00)
GFR, Estimated: >60 mL/min (ref >60)
Glucose, Bld: 120 mg/dL — ABNORMAL HIGH (ref 70–99)
Potassium: 4.4 mmol/L (ref 3.5–5.1)
Sodium: 138 mmol/L (ref 135–145)

## 2023-04-07 LAB — CBC
HCT: 33.3 % — ABNORMAL LOW (ref 36.0–46.0)
Hemoglobin: 11.1 g/dL — ABNORMAL LOW (ref 12.0–15.0)
MCH: 33.9 pg (ref 26.0–34.0)
MCHC: 33.3 g/dL (ref 30.0–36.0)
MCV: 101.8 fL — ABNORMAL HIGH (ref 80.0–100.0)
Platelets: 201 10*3/uL (ref 150–400)
RBC: 3.27 MIL/uL — ABNORMAL LOW (ref 3.87–5.11)
RDW: 13.7 % (ref 11.5–15.5)
WBC: 3.1 10*3/uL — ABNORMAL LOW (ref 4.0–10.5)
nRBC: 0 % (ref 0.0–0.2)

## 2023-04-07 MED ORDER — LIDOCAINE 5 % EX PTCH
1.0000 | MEDICATED_PATCH | CUTANEOUS | Status: DC
  Administered 2023-04-07: 1 via TRANSDERMAL
  Filled 2023-04-07: qty 1

## 2023-04-07 MED ORDER — HYDROMORPHONE HCL 1 MG/ML IJ SOLN
1.0000 mg | Freq: Once | INTRAMUSCULAR | Status: AC
  Administered 2023-04-07: 1 mg via INTRAVENOUS
  Filled 2023-04-07: qty 1

## 2023-04-07 MED ORDER — KETOROLAC TROMETHAMINE 15 MG/ML IJ SOLN
15.0000 mg | Freq: Once | INTRAMUSCULAR | Status: AC
  Administered 2023-04-07: 15 mg via INTRAVENOUS
  Filled 2023-04-07: qty 1

## 2023-04-07 MED ORDER — OXYCODONE-ACETAMINOPHEN 5-325 MG PO TABS
2.0000 | ORAL_TABLET | Freq: Once | ORAL | Status: AC
  Administered 2023-04-07: 2 via ORAL
  Filled 2023-04-07: qty 2

## 2023-04-07 MED ORDER — HYDROMORPHONE HCL 1 MG/ML IJ SOLN
1.0000 mg | Freq: Once | INTRAMUSCULAR | Status: AC
  Administered 2023-04-07: 1 mg via INTRAMUSCULAR
  Filled 2023-04-07: qty 1

## 2023-04-07 MED ORDER — LORAZEPAM 2 MG/ML IJ SOLN: 0.5000 mg | Freq: Once | INTRAMUSCULAR | Status: DC

## 2023-04-07 NOTE — ED Provider Notes (Signed)
[place] EMERGENCY DEPARTMENT AT [facility] Provider Note   CSN: [id number] Arrival date & time: 04/07/23  1711     History  Chief Complaint  Patient presents with   Leg Pain    Stacey [name] is a 77 y.o. female.   Leg Pain   77 year old female presents emergency department with complaints of acute on chronic low back/leg pain.  Patient with history of breast cancer with metastatic disease to her spine.  Patient bedbound at baseline and receives morphine through intrathecal pump as well as has multiple oral medications such as p.o. Dilaudid, oxycodone, pregabalin, duloxetine of which she states are helping minimally.  Denies any known new trauma/injury.  States that symptoms been worse for approximately past week.  States that she has to hold her feet/legs in certain position to decrease likelihood of exacerbation of pain.  States that back pain radiates down bilateral legs as well as she has has sensory deficits down bilateral legs.  Patient states that symptoms been worse for approximate the past week.  Denies any saddle anesthesia, bowel/bladder dysfunction, fever.  No chest pain, shortness of breath, abdominal pain, urinary symptoms, change in bowel habits.  Past medical history significant for breast cancer with metastatic disease to spine, spinal stenosis, obesity, CHF, bipolar 2 disorder, opioid dependence, CAD, MI, hypertension, COPD  Home Medications Prior to Admission medications   Medication Sig Start Date End Date Taking? Authorizing Provider  acetaminophen (TYLENOL) 325 MG tablet Take 2 tablets (650 mg total) by mouth every 6 (six) hours as needed for mild pain, fever or headache. 04/05/21   Shon Hale, MD  amLODipine (NORVASC) 5 MG tablet TAKE 1 TABLET (5 MG TOTAL) BY MOUTH DAILY 01/27/22   Antoine Poche, MD  aspirin EC 81 MG tablet Take 1 tablet (81 mg total) by mouth daily with breakfast. Swallow whole. 04/07/21   Shon Hale, MD  atorvastatin  (LIPITOR) 80 MG tablet TAKE 1 TABLET EVERY MORNING 09/15/22   Antoine Poche, MD  b complex vitamins tablet Take 1 tablet by mouth every morning. 07/05/20   Albertine Grates, MD  carboxymethylcellulose (REFRESH PLUS) 0.5 % SOLN Place 1 drop into both eyes as needed.    [provider]  clopidogrel (PLAVIX) 75 MG tablet TAKE 1 TABLET EVERY DAY 09/15/22   Antoine Poche, MD  DULoxetine (CYMBALTA) 30 MG capsule Take 3 capsules (90 mg total) by mouth daily. Patient taking differently: Take 30 mg by mouth 2 (two) times daily. 07/06/20   Albertine Grates, MD  ferrous sulfate 325 (65 FE) MG tablet Take 325 mg by mouth daily.    [provider]  HYDROmorphone (DILAUDID) 2 MG tablet Take 1 tablet (2 mg total) by mouth every 6 (six) hours as needed for severe pain (breakthrough pain). 02/26/23   Arthor Captain, PA-C  losartan (COZAAR) 100 MG tablet TAKE 1 TABLET EVERY DAY 09/10/22   Antoine Poche, MD  metoprolol succinate (TOPROL-XL) 100 MG 24 hr tablet TAKE 1 AND 1/2 TABLETS EVERY DAY. DOSE INCREASE. 06/13/22   Antoine Poche, MD  Multiple Vitamin (MULTIVITAMIN ADULT PO) Take 1 tablet by mouth daily.    [provider]  Multiple Vitamins-Minerals (EQ VISION FORMULA 50+ PO) Take 1 tablet by mouth every morning.    [provider]  naloxone [facility]) nasal spray 4 mg/0.1 mL Place into the nose. 02/27/23   [provider]  nitroGLYCERIN (NITROSTAT) 0.4 MG SL tablet Place 1 tablet (0.4 mg total) under  the tongue every 5 (five) minutes x 3 doses as needed for chest pain (if no relief after 3rd dose, proceed to the ED or call 911). Patient not taking: Reported on 03/09/2023 07/11/21   Antoine Poche, MD  oxyCODONE (OXY IR/ROXICODONE) 5 MG immediate release tablet Take 10 mg by mouth every 4 (four) hours as needed for severe pain. 10/31/22   [provider]  oxyCODONE (OXY IR/ROXICODONE) 5 MG immediate release tablet Take by mouth. 01/07/23   [provider]   Oxycodone HCl 10 MG TABS Take 10 mg by mouth 3 (three) times daily as needed. 08/26/22   [provider]  PAIN MANAGEMENT INTRATHECAL, IT, PUMP by Intrathecal route. 02/21/21   [provider]  pregabalin (LYRICA) 50 MG capsule Take 50 mg by mouth daily as needed (pain). 01/26/21   [provider]      Allergies    Brilinta [ticagrelor], Budesonide, and Albuterol    Review of Systems   Review of Systems  All other systems reviewed and are negative.   Physical Exam Updated Vital Signs BP (!) 155/99 (BP Location: Right Arm)   Pulse (!) 103   Temp 98.1 F (36.7 C) (Oral)   Resp 17   Ht 5\' 6"  (1.676 m)   Wt 97.5 kg   SpO2 100%   BMI 34.70 kg/m  Physical Exam Vitals and nursing note reviewed.  Constitutional:      General: She is not in acute distress.    Appearance: She is well-developed.  HENT:     Head: Normocephalic and atraumatic.  Eyes:     Conjunctiva/sclera: Conjunctivae normal.  Cardiovascular:     Rate and Rhythm: Normal rate and regular rhythm.     Heart sounds: No murmur heard. Pulmonary:     Effort: Pulmonary effort is normal. No respiratory distress.     Breath sounds: Normal breath sounds.  Abdominal:     Palpations: Abdomen is soft.     Tenderness: There is no abdominal tenderness.  Musculoskeletal:        General: No swelling.     Cervical back: Neck supple.     Comments: Patient with midline as well as paraspinal tenderness noted in the lumbar region bilaterally.  Patient with decreased range of motion of bilateral legs and hips, knees secondary to pain.  Patient able to dorsi/plantarflex feet.  Pedal pulses 2+ bilaterally.  Patient complaining of sensory deficits bilaterally in lower extremities.  Unable to assess DTRs.  Skin:    General: Skin is warm and dry.     Capillary Refill: Capillary refill takes less than 2 seconds.  Neurological:     Mental Status: She is alert.  Psychiatric:        Mood and Affect: Mood normal.      ED Results / Procedures / Treatments   Labs (all labs ordered are listed, but only abnormal results are displayed) Labs Reviewed  BASIC METABOLIC PANEL - Abnormal; Notable for the following components:      Result Value   Glucose, Bld 120 (*)    All other components within normal limits  CBC - Abnormal; Notable for the following components:   WBC 3.1 (*)    RBC 3.27 (*)    Hemoglobin 11.1 (*)    HCT 33.3 (*)    MCV 101.8 (*)    All other components within normal limits    EKG None  Radiology No results found.  Procedures Procedures    Medications Ordered  in ED Medications  LORazepam (ATIVAN) injection 0.5 mg (has no administration in time range)  HYDROmorphone (DILAUDID) injection 1 mg (1 mg Intramuscular Given 04/07/23 1806)  HYDROmorphone (DILAUDID) injection 1 mg (1 mg Intravenous Given 04/07/23 1916)    ED Course/ Medical Decision Making/ A&P                             Medical Decision Making Amount and/or Complexity of Data Reviewed Labs: ordered.  Risk Prescription drug management.   This patient presents to the ED for concern of low back pain, this involves an extensive number of treatment options, and is a complaint that carries with it a high risk of complications and morbidity.  The differential diagnosis includes fracture, dislocation, sciatica, strain/sprain, cauda equina, spinal epidural abscess   Co morbidities that complicate the patient evaluation  See HPI   Additional history obtained:  Additional history obtained from EMR External records from outside source obtained and reviewed including hospital records   Lab Tests:  I Ordered, and personally interpreted labs.  The pertinent results include: Leukopenia of 3.1.  Patient evidence of anemia with a hemoglobin 11.1 of which seems near patient's baseline.  Platelets within range.  No electrolyte abnormalities.  No renal dysfunction.   Imaging Studies ordered:  MRI ordered but  unable to be images secondary to being out of service   Cardiac Monitoring: / EKG:  N/a   Consultations Obtained:  I requested consultation with attending physician Dr. Durwin Nora who was in agreement with treatment plan going forward   Problem List / ED Course / Critical interventions / Medication management  Low back pain I ordered medication including Dilaudid   Reevaluation of the patient after these medicines showed that the patient improved I have reviewed the patients home medicines and have made adjustments as needed   Social Determinants of Health:  Former cigarette use.  Denies illicit drug use.   Test / Admission - Considered:  Chronic low back pain Laboratory/imaging studies significant for: See above 77 year old female with history of L1 compression fracture, chronic low back pain with evidence of metastatic disease to spine presented to the emergency department with acute on chronic back pain over the past week.  Patient with some sensory deficits in bilateral lower extremities.  Pain with pain radiating down bilateral lower extremities.  Patient with inability to express full range of motion of bilateral lower extremities but bedbound at baseline.  Upon further review of chart, patient with extensive history involving multiple neurosurgical and oncology visits with shared consensus of patient being at more risk for operation of low back despite recurrence of acute on chronic symptoms and with evidence of cauda equina compression on MRI in June 2023.  Decision was had for medical management of patient's pain.  Discussion was had with patient at bedside who reported understanding of similar conversations with specialist at [facility].  She elected for additional pain medicine while emergency department and elected for discharge with follow-up with specialist outpatient given nonsurgical status of patient despite lower extremity/low back symptoms.  Discussion was had with attending  physician Dr. Durwin Nora who is in agreement with treatment plan going forward.  Will recommend continuation of patient's at home pain medication regimen and close follow-up with specialist in the outpatient setting.  Strict return precautions were discussed at length.  Treatment plan discussed at length with patient and she acknowledged understanding was agreeable to said plan.  Worrisome signs and  symptoms were discussed with the patient and she acknowledged understanding to return to the emergency department if notice.  Patient stable upon discharge from the hospital.          Final Clinical Impression(s) / ED Diagnoses Final diagnoses:  Chronic midline low back pain with sciatica, sciatica laterality unspecified    Rx / DC Orders ED Discharge Orders     None         Peter Garter, [place] 04/07/23 1929    Gloris Manchester, MD 04/09/23 0020

## 2023-04-07 NOTE — ED Notes (Signed)
Notified [place] [facility] of patient needing transportation back home.  

## 2023-04-07 NOTE — ED Triage Notes (Signed)
Pt complaining of bil leg pain. 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 Pt with chronic leg pain.

## 2023-04-07 NOTE — Discharge Instructions (Addendum)
Continue multimodal pain control at home.  This would include narcotics as well as NSAID medications and Tylenol.  You should take daily MiraLAX and Metamucil to avoid constipation.  These are available over-the-counter.  Follow-up with your pain management team as scheduled on Thursday to discuss further changes that can help manage your pain and give you the maximum amount of good days.  Return to the emergency department for any new or worsening symptoms of concern.

## 2023-04-28 ENCOUNTER — Other Ambulatory Visit: Payer: Self-pay

## 2023-04-28 DIAGNOSIS — C50911 Malignant neoplasm of unspecified site of right female breast: Secondary | ICD-10-CM

## 2023-04-29 ENCOUNTER — Inpatient Hospital Stay: Payer: Medicare Other | Attending: Hematology

## 2023-04-29 ENCOUNTER — Inpatient Hospital Stay: Payer: Medicare Other

## 2023-04-29 VITALS — BP 127/45 | HR 66 | Temp 97.7°F | Resp 16

## 2023-04-29 DIAGNOSIS — C50911 Malignant neoplasm of unspecified site of right female breast: Secondary | ICD-10-CM | POA: Insufficient documentation

## 2023-04-29 DIAGNOSIS — Z5111 Encounter for antineoplastic chemotherapy: Secondary | ICD-10-CM | POA: Insufficient documentation

## 2023-04-29 DIAGNOSIS — C7951 Secondary malignant neoplasm of bone: Secondary | ICD-10-CM | POA: Diagnosis present

## 2023-04-29 LAB — CBC WITH DIFFERENTIAL/PLATELET
Abs Immature Granulocytes: 0.01 10*3/uL (ref 0.00–0.07)
Basophils Absolute: 0 10*3/uL (ref 0.0–0.1)
Basophils Relative: 1 %
Eosinophils Absolute: 0.1 10*3/uL (ref 0.0–0.5)
Eosinophils Relative: 2 %
HCT: 31.9 % — ABNORMAL LOW (ref 36.0–46.0)
Hemoglobin: 10.3 g/dL — ABNORMAL LOW (ref 12.0–15.0)
Immature Granulocytes: 0 %
Lymphocytes Relative: 23 %
Lymphs Abs: 1.1 10*3/uL (ref 0.7–4.0)
MCH: 33.9 pg (ref 26.0–34.0)
MCHC: 32.3 g/dL (ref 30.0–36.0)
MCV: 104.9 fL — ABNORMAL HIGH (ref 80.0–100.0)
Monocytes Absolute: 0.4 10*3/uL (ref 0.1–1.0)
Monocytes Relative: 8 %
Neutro Abs: 3 10*3/uL (ref 1.7–7.7)
Neutrophils Relative %: 66 %
Platelets: 162 10*3/uL (ref 150–400)
RBC: 3.04 MIL/uL — ABNORMAL LOW (ref 3.87–5.11)
RDW: 13.8 % (ref 11.5–15.5)
WBC: 4.5 10*3/uL (ref 4.0–10.5)
nRBC: 0 % (ref 0.0–0.2)

## 2023-04-29 LAB — COMPREHENSIVE METABOLIC PANEL
ALT: 17 U/L (ref 0–44)
AST: 22 U/L (ref 15–41)
Albumin: 3.1 g/dL — ABNORMAL LOW (ref 3.5–5.0)
Alkaline Phosphatase: 37 U/L — ABNORMAL LOW (ref 38–126)
Anion gap: 10 (ref 5–15)
BUN: 20 mg/dL (ref 8–23)
CO2: 27 mmol/L (ref 22–32)
Calcium: 8.8 mg/dL — ABNORMAL LOW (ref 8.9–10.3)
Chloride: 104 mmol/L (ref 98–111)
Creatinine, Ser: 0.62 mg/dL (ref 0.44–1.00)
GFR, Estimated: >60 mL/min (ref >60)
Glucose, Bld: 113 mg/dL — ABNORMAL HIGH (ref 70–99)
Potassium: 4.7 mmol/L (ref 3.5–5.1)
Sodium: 141 mmol/L (ref 135–145)
Total Bilirubin: 0.9 mg/dL (ref 0.3–1.2)
Total Protein: 5.8 g/dL — ABNORMAL LOW (ref 6.5–8.1)

## 2023-04-29 MED ORDER — DENOSUMAB 120 MG/1.7ML ~~LOC~~ SOLN
120.0000 mg | Freq: Once | SUBCUTANEOUS | Status: AC
  Administered 2023-04-29: 120 mg via SUBCUTANEOUS
  Filled 2023-04-29: qty 1.7

## 2023-04-29 MED ORDER — FULVESTRANT 250 MG/5ML IM SOSY
500.0000 mg | PREFILLED_SYRINGE | Freq: Once | INTRAMUSCULAR | Status: AC
  Administered 2023-04-29: 500 mg via INTRAMUSCULAR
  Filled 2023-04-29: qty 10

## 2023-04-29 NOTE — Progress Notes (Signed)
Patient taking calcium as directed. Denied tooth, jaw, and leg pain. No recent or upcoming dental visits. Labs reviewed. Patient tolerated injection with no complaints voiced. See MAR for details. Patient stable during and after injection. Site clean and dry with no bruising or swelling noted. Band aid applied.  Patient tolerated Faslodex injection with no complaints voiced. Bilateral sites clean and dry with no bruising or swelling noted. Band aids applied. See MAR for details. Patient stable during and after injections. VSS with discharge and left in satisfactory condition with no s/s of distress noted.  

## 2023-04-29 NOTE — Patient Instructions (Signed)
[facility]  Discharge Instructions: Thank you for choosing [facility] to provide your oncology and hematology care.  If you have a lab appointment with the Cancer Center - please note that after April 8th, 2024, all labs will be drawn in the cancer center.  You do not have to check in or register with the main entrance as you have in the past but will complete your check-in in the cancer center.  Wear comfortable clothing and clothing appropriate for easy access to any Portacath or PICC line.   We strive to give you quality time with your provider. You may need to reschedule your appointment if you arrive late (15 or more minutes).  Arriving late affects you and other patients whose appointments are after yours.  Also, if you miss three or more appointments without notifying the office, you may be dismissed from the clinic at the provider's discretion.      For prescription refill requests, have your pharmacy contact our office and allow 72 hours for refills to be completed.    Today you received the following Xgeva and Faslodex, return as scheduled.    To help prevent nausea and vomiting after your treatment, we encourage you to take your nausea medication as directed.  BELOW ARE SYMPTOMS THAT SHOULD BE REPORTED IMMEDIATELY: *FEVER GREATER THAN 100.4 F (38 C) OR HIGHER *CHILLS OR SWEATING *NAUSEA AND VOMITING THAT IS NOT CONTROLLED WITH YOUR NAUSEA MEDICATION *UNUSUAL SHORTNESS OF BREATH *UNUSUAL BRUISING OR BLEEDING *URINARY PROBLEMS (pain or burning when urinating, or frequent urination) *BOWEL PROBLEMS (unusual diarrhea, constipation, pain near the anus) TENDERNESS IN MOUTH AND THROAT WITH OR WITHOUT PRESENCE OF ULCERS (sore throat, sores in mouth, or a toothache) UNUSUAL RASH, SWELLING OR PAIN  UNUSUAL VAGINAL DISCHARGE OR ITCHING   Items with * indicate a potential emergency and should be followed up as soon as possible or go to the Emergency  Department if any problems should occur.  Please show the CHEMOTHERAPY ALERT CARD or IMMUNOTHERAPY ALERT CARD at check-in to the Emergency Department and triage nurse.  Should you have questions after your visit or need to cancel or reschedule your appointment, please contact [facility]ENTER AT [facility] [phone]  and follow the prompts.  Office hours are 8:00 a.m. to 4:30 p.m. Monday - Friday. Please note that voicemails left after 4:00 p.m. may not be returned until the following business day.  We are closed weekends and major holidays. You have access to a nurse at all times for urgent questions. Please call the main number to the clinic [phone] and follow the prompts.  For any non-urgent questions, you may also contact your provider using MyChart. We now offer e-Visits for anyone 10 and older to request care online for non-urgent symptoms. For details visit [email or web address].   Also download the MyChart app! Go to the app store, search "MyChart", open the app, select [facility], and log in with your MyChart username and password.

## 2023-05-05 ENCOUNTER — Telehealth: Payer: Self-pay | Admitting: Cardiology

## 2023-05-05 MED ORDER — NITROGLYCERIN 0.4 MG SL SUBL: 0.4000 mg | SUBLINGUAL_TABLET | SUBLINGUAL | 3 refills | Status: DC | PRN

## 2023-05-05 NOTE — Telephone Encounter (Signed)
Patient called to report as requested:  BP - 121/60, HR 77, Oxygen - 95

## 2023-05-05 NOTE — Telephone Encounter (Signed)
*  STAT* If patient is at the pharmacy, call can be transferred to refill team.   1. Which medications need to be refilled? (please list name of each medication and dose if known) nitroGLYCERIN (NITROSTAT) 0.4 MG SL tablet  2. Which pharmacy/location (including street and city if local pharmacy) is medication to be sent to? [id number] - [place], [place] - [address]  3. Do they need a 30 day or 90 day supply?   Standard emergency supply Current prescription is expired

## 2023-05-05 NOTE — Telephone Encounter (Signed)
Spoke with pt who states that she has been having palpitations. Pt reports taking a nitro for these palpitations and state that it helped. Pt drinks 3 cups of coffee daily and states that she has been doing so for years. Informed pt that coffee can cause palpitations. State that she needs a refill on Nitro. She does not have current vitals on hand but will once her son arrives to help her. Pt will call back with those readings. No other complaints at this time. Please advise.

## 2023-05-05 NOTE — Telephone Encounter (Signed)
Patient c/o Palpitations:  High priority if patient c/o lightheadedness, shortness of breath, or chest pain  How long have you had palpitations/irregular HR/ Afib? Are you having the symptoms now?  Had palpitations last night No palpitations currently  Are you currently experiencing lightheadedness, SOB or CP?  No   Do you have a history of afib (atrial fibrillation) or irregular heart rhythm?  Yes   Have you checked your BP or HR? (document readings if available): No   Are you experiencing any other symptoms?  No

## 2023-05-06 NOTE — Telephone Encounter (Signed)
Try weaning caffeine and update [place] end of the week, if still ongoing may need to increase her toprol. Any recent courses of steroids?  Dominga Ferry MD

## 2023-05-08 NOTE — Telephone Encounter (Signed)
Left message to return call 

## 2023-05-08 NOTE — Telephone Encounter (Signed)
Patient is returning call. Transferred to Lydia, RN.  

## 2023-05-08 NOTE — Telephone Encounter (Signed)
Patient informed and verbalized understanding of plan. Reports not recent steroid therapy.

## 2023-05-12 ENCOUNTER — Other Ambulatory Visit: Payer: Self-pay | Admitting: Cardiology

## 2023-05-19 ENCOUNTER — Ambulatory Visit (HOSPITAL_COMMUNITY): Payer: Medicare Other

## 2023-05-26 ENCOUNTER — Telehealth: Payer: Medicare Other | Admitting: Cardiology

## 2023-05-26 ENCOUNTER — Telehealth: Payer: Self-pay | Admitting: Cardiology

## 2023-05-26 NOTE — Telephone Encounter (Signed)
Can we sort out which hospital she was in, I don't see any records in our system. 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

## 2023-05-26 NOTE — Telephone Encounter (Signed)
Pt c/o medication issue:  1. Name of Medication: clopidogrel (PLAVIX) 75 MG tablet  Eliquis   2. How are you currently taking this medication (dosage and times per day)?   3. Are you having a reaction (difficulty breathing--STAT)?   4. What is your medication issue? Patient's son states that patient was put on eliquis in hospital due to blood clot, but patient can not afford to stay on this medication and would like to switch back to the above medication. Would like to speak with someone in regards to this. Please advise.

## 2023-05-27 ENCOUNTER — Inpatient Hospital Stay: Payer: Medicare Other

## 2023-05-27 ENCOUNTER — Inpatient Hospital Stay: Payer: Medicare Other | Admitting: Hematology

## 2023-05-27 ENCOUNTER — Inpatient Hospital Stay: Payer: Medicare Other | Attending: Hematology

## 2023-05-27 NOTE — Telephone Encounter (Signed)
Should discuss with her oncologist who also specialize in blood clots and blood related problems.  Not really on the eliquis for a specific cardiac reason. In general she needs to complete at least 6 months of blood thinner, plavix does not do anything to treat this type of blood clot.   Dominga Ferry MD

## 2023-06-08 ENCOUNTER — Telehealth: Payer: Self-pay | Admitting: Cardiology

## 2023-06-08 NOTE — Telephone Encounter (Signed)
Per Dr Verna Czech note 05/27/23 looks like this needs to go to her oncologist since they prescribed it.  It is not listed in her med list.  Thanks

## 2023-06-08 NOTE — Telephone Encounter (Signed)
Patient's son Alinda Money) notified and verbalized understanding.

## 2023-06-08 NOTE — Telephone Encounter (Signed)
Pt c/o medication issue:  1. Name of Medication: Eliquis 5 MG  2. How are you currently taking this medication (dosage and times per day)?   3. Are you having a reaction (difficulty breathing--STAT)? No  4. What is your medication issue? Pt;s son states that pt was prescribed medication while in the hospital. He would ike a callback regarding whether pt is able to get Pt. Assistance to help with the cost of medication. Please advise

## 2023-06-15 ENCOUNTER — Inpatient Hospital Stay: Payer: Medicare Other

## 2023-06-15 ENCOUNTER — Inpatient Hospital Stay: Payer: Medicare Other | Attending: Hematology

## 2023-06-15 VITALS — BP 122/43 | HR 56 | Temp 98.4°F | Resp 16

## 2023-06-15 DIAGNOSIS — Z5111 Encounter for antineoplastic chemotherapy: Secondary | ICD-10-CM | POA: Insufficient documentation

## 2023-06-15 DIAGNOSIS — C7951 Secondary malignant neoplasm of bone: Secondary | ICD-10-CM | POA: Insufficient documentation

## 2023-06-15 DIAGNOSIS — Z170 Estrogen receptor positive status [ER+]: Secondary | ICD-10-CM

## 2023-06-15 DIAGNOSIS — C50911 Malignant neoplasm of unspecified site of right female breast: Secondary | ICD-10-CM

## 2023-06-15 LAB — CBC WITH DIFFERENTIAL/PLATELET
Abs Immature Granulocytes: 0.03 10*3/uL (ref 0.00–0.07)
Basophils Absolute: 0 10*3/uL (ref 0.0–0.1)
Basophils Relative: 1 %
Eosinophils Absolute: 0.1 10*3/uL (ref 0.0–0.5)
Eosinophils Relative: 3 %
HCT: 32 % — ABNORMAL LOW (ref 36.0–46.0)
Hemoglobin: 10.2 g/dL — ABNORMAL LOW (ref 12.0–15.0)
Immature Granulocytes: 1 %
Lymphocytes Relative: 23 %
Lymphs Abs: 1 10*3/uL (ref 0.7–4.0)
MCH: 33.9 pg (ref 26.0–34.0)
MCHC: 31.9 g/dL (ref 30.0–36.0)
MCV: 106.3 fL — ABNORMAL HIGH (ref 80.0–100.0)
Monocytes Absolute: 0.3 10*3/uL (ref 0.1–1.0)
Monocytes Relative: 8 %
Neutro Abs: 2.8 10*3/uL (ref 1.7–7.7)
Neutrophils Relative %: 64 %
Platelets: 185 10*3/uL (ref 150–400)
RBC: 3.01 MIL/uL — ABNORMAL LOW (ref 3.87–5.11)
RDW: 13.3 % (ref 11.5–15.5)
WBC: 4.3 10*3/uL (ref 4.0–10.5)
nRBC: 0 % (ref 0.0–0.2)

## 2023-06-15 LAB — COMPREHENSIVE METABOLIC PANEL
ALT: 15 U/L (ref 0–44)
AST: 19 U/L (ref 15–41)
Albumin: 2.8 g/dL — ABNORMAL LOW (ref 3.5–5.0)
Alkaline Phosphatase: 40 U/L (ref 38–126)
Anion gap: 5 (ref 5–15)
BUN: 16 mg/dL (ref 8–23)
CO2: 32 mmol/L (ref 22–32)
Calcium: 8.6 mg/dL — ABNORMAL LOW (ref 8.9–10.3)
Chloride: 103 mmol/L (ref 98–111)
Creatinine, Ser: 0.6 mg/dL (ref 0.44–1.00)
GFR, Estimated: >60 mL/min (ref >60)
Glucose, Bld: 126 mg/dL — ABNORMAL HIGH (ref 70–99)
Potassium: 4.7 mmol/L (ref 3.5–5.1)
Sodium: 140 mmol/L (ref 135–145)
Total Bilirubin: 0.7 mg/dL (ref 0.3–1.2)
Total Protein: 5.4 g/dL — ABNORMAL LOW (ref 6.5–8.1)

## 2023-06-15 MED ORDER — DENOSUMAB 120 MG/1.7ML ~~LOC~~ SOLN
120.0000 mg | Freq: Once | SUBCUTANEOUS | Status: AC
  Administered 2023-06-15: 120 mg via SUBCUTANEOUS
  Filled 2023-06-15: qty 1.7

## 2023-06-15 MED ORDER — FULVESTRANT 250 MG/5ML IM SOSY
500.0000 mg | PREFILLED_SYRINGE | Freq: Once | INTRAMUSCULAR | Status: AC
  Administered 2023-06-15: 500 mg via INTRAMUSCULAR

## 2023-06-15 NOTE — Progress Notes (Signed)
Patient presents today for Xgeva and Faslodex injections.  Patient is in satisfactory condition with no new complaints voiced.  Vital signs are stable.  Labs reviewed.  We will proceed with injections per provider orders.    Patient tolerated injections with no complaints voiced.  Site clean and dry with no bruising or swelling noted.  No complaints of pain.  Discharged to rescue squad with vital signs stable and no signs or symptoms of distress noted.

## 2023-06-15 NOTE — Patient Instructions (Signed)
[facility]  Discharge Instructions: Thank you for choosing [facility] to provide your oncology and hematology care.  If you have a lab appointment with the Cancer Center - please note that after April 8th, 2024, all labs will be drawn in the cancer center.  You do not have to check in or register with the main entrance as you have in the past but will complete your check-in in the cancer center.  Wear comfortable clothing and clothing appropriate for easy access to any Portacath or PICC line.   We strive to give you quality time with your provider. You may need to reschedule your appointment if you arrive late (15 or more minutes).  Arriving late affects you and other patients whose appointments are after yours.  Also, if you miss three or more appointments without notifying the office, you may be dismissed from the clinic at the provider's discretion.      For prescription refill requests, have your pharmacy contact our office and allow 72 hours for refills to be completed.    Today you received the following chemotherapy and/or immunotherapy agents Faslodex/Xgeva.  Fulvestrant Injection What is this medication? FULVESTRANT (ful VES trant) treats breast cancer. It works by blocking the hormone estrogen in breast tissue, which prevents breast cancer cells from spreading or growing. This medicine may be used for other purposes; ask your health care provider or pharmacist if you have questions. COMMON BRAND NAME(S): FASLODEX What should I tell my care team before I take this medication? They need to know if you have any of these conditions: Bleeding disorder Liver disease Low blood cell levels, such as low white cells, red cells, and platelets An unusual or allergic reaction to fulvestrant, other medications, foods, dyes, or preservatives Pregnant or trying to get pregnant Breast-feeding How should I use this medication? This medication is injected into a  muscle. It is given by your care team in a hospital or clinic setting. Talk to your care team about the use of this medication in children. Special care may be needed. Overdosage: If you think you have taken too much of this medicine contact a poison control center or emergency room at once. NOTE: This medicine is only for you. Do not share this medicine with others. What if I miss a dose? Keep appointments for follow-up doses. It is important not to miss your dose. Call your care team if you are unable to keep an appointment. What may interact with this medication? Certain medications that prevent or treat blood clots, such as warfarin, enoxaparin, dalteparin, apixaban, dabigatran, rivaroxaban This list may not describe all possible interactions. Give your health care provider a list of all the medicines, herbs, non-prescription drugs, or dietary supplements you use. Also tell them if you smoke, drink alcohol, or use illegal drugs. Some items may interact with your medicine. What should I watch for while using this medication? Your condition will be monitored carefully while you are receiving this medication. You may need blood work while taking this medication. Talk to your care team if you may be pregnant. Serious birth defects can occur if you take this medication during pregnancy and for 1 year after the last dose. You will need a negative pregnancy test before starting this medication. Contraception is recommended while taking this medication and for 1 year after the last dose. Your care team can help you find the option that works for you. Do not breastfeed while taking this medication and  for 1 year after the last dose. This medication may cause infertility. Talk to your care team if you are concerned about your fertility. What side effects may I notice from receiving this medication? Side effects that you should report to your care team as soon as possible: Allergic reactions or  angioedema--skin rash, itching or hives, swelling of the face, eyes, lips, tongue, arms, or legs, trouble swallowing or breathing Pain, tingling, or numbness in the hands or feet Side effects that usually do not require medical attention (report to your care team if they continue or are bothersome): Bone, joint, or muscle pain Constipation Headache Hot flashes Nausea Pain, redness, or irritation at injection site Unusual weakness or fatigue This list may not describe all possible side effects. Call your doctor for medical advice about side effects. You may report side effects to FDA at 1-800-FDA-1088. Where should I keep my medication? This medication is given in a hospital or clinic. It will not be stored at home. NOTE: This sheet is a summary. It may not cover all possible information. If you have questions about this medicine, talk to your doctor, pharmacist, or health care provider.  2024 Elsevier/Gold Standard (2022-04-01 00:00:00)    Denosumab Injection (Oncology) What is this medication? DENOSUMAB (den oh SUE mab) prevents weakened bones caused by cancer. It may also be used to treat noncancerous bone tumors that cannot be removed by surgery. It can also be used to treat high calcium levels in the blood caused by cancer. It works by blocking a protein that causes bones to break down quickly. This slows down the release of calcium from bones, which lowers calcium levels in your blood. It also makes your bones stronger and less likely to break (fracture). This medicine may be used for other purposes; ask your health care provider or pharmacist if you have questions. COMMON BRAND NAME(S): XGEVA What should I tell my care team before I take this medication? They need to know if you have any of these conditions: Dental disease Having surgery or tooth extraction Infection Kidney disease Low levels of calcium or vitamin D in the blood Malnutrition On hemodialysis Skin conditions or  sensitivity Thyroid or parathyroid disease An unusual reaction to denosumab, other medications, foods, dyes, or preservatives Pregnant or trying to get pregnant Breast-feeding How should I use this medication? This medication is for injection under the skin. It is given by your care team in a hospital or clinic setting. A special MedGuide will be given to you before each treatment. Be sure to read this information carefully each time. Talk to your care team about the use of this medication in children. While it may be prescribed for children as young as 13 years for selected conditions, precautions do apply. Overdosage: If you think you have taken too much of this medicine contact a poison control center or emergency room at once. NOTE: This medicine is only for you. Do not share this medicine with others. What if I miss a dose? Keep appointments for follow-up doses. It is important not to miss your dose. Call your care team if you are unable to keep an appointment. What may interact with this medication? Do not take this medication with any of the following: Other medications containing denosumab This medication may also interact with the following: Medications that lower your chance of fighting infection Steroid medications, such as prednisone or cortisone This list may not describe all possible interactions. Give your health care provider a list of all  the medicines, herbs, non-prescription drugs, or dietary supplements you use. Also tell them if you smoke, drink alcohol, or use illegal drugs. Some items may interact with your medicine. What should I watch for while using this medication? Your condition will be monitored carefully while you are receiving this medication. You may need blood work while taking this medication. This medication may increase your risk of getting an infection. Call your care team for advice if you get a fever, chills, sore throat, or other symptoms of a cold or  flu. Do not treat yourself. Try to avoid being around people who are sick. You should make sure you get enough calcium and vitamin D while you are taking this medication, unless your care team tells you not to. Discuss the foods you eat and the vitamins you take with your care team. Some people who take this medication have severe bone, joint, or muscle pain. This medication may also increase your risk for jaw problems or a broken thigh bone. Tell your care team right away if you have severe pain in your jaw, bones, joints, or muscles. Tell your care team if you have any pain that does not go away or that gets worse. Talk to your care team if you may be pregnant. Serious birth defects can occur if you take this medication during pregnancy and for 5 months after the last dose. You will need a negative pregnancy test before starting this medication. Contraception is recommended while taking this medication and for 5 months after the last dose. Your care team can help you find the option that works for you. What side effects may I notice from receiving this medication? Side effects that you should report to your care team as soon as possible: Allergic reactions--skin rash, itching, hives, swelling of the face, lips, tongue, or throat Bone, joint, or muscle pain Low calcium level--muscle pain or cramps, confusion, tingling, or numbness in the hands or feet Osteonecrosis of the jaw--pain, swelling, or redness in the mouth, numbness of the jaw, poor healing after dental work, unusual discharge from the mouth, visible bones in the mouth Side effects that usually do not require medical attention (report to your care team if they continue or are bothersome): Cough Diarrhea Fatigue Headache Nausea This list may not describe all possible side effects. Call your doctor for medical advice about side effects. You may report side effects to FDA at 1-800-FDA-1088. Where should I keep my medication? This medication  is given in a hospital or clinic. It will not be stored at home. NOTE: This sheet is a summary. It may not cover all possible information. If you have questions about this medicine, talk to your doctor, pharmacist, or health care provider.  2024 Elsevier/Gold Standard (2022-04-09 00:00:00)        To help prevent nausea and vomiting after your treatment, we encourage you to take your nausea medication as directed.  BELOW ARE SYMPTOMS THAT SHOULD BE REPORTED IMMEDIATELY: *FEVER GREATER THAN 100.4 F (38 C) OR HIGHER *CHILLS OR SWEATING *NAUSEA AND VOMITING THAT IS NOT CONTROLLED WITH YOUR NAUSEA MEDICATION *UNUSUAL SHORTNESS OF BREATH *UNUSUAL BRUISING OR BLEEDING *URINARY PROBLEMS (pain or burning when urinating, or frequent urination) *BOWEL PROBLEMS (unusual diarrhea, constipation, pain near the anus) TENDERNESS IN MOUTH AND THROAT WITH OR WITHOUT PRESENCE OF ULCERS (sore throat, sores in mouth, or a toothache) UNUSUAL RASH, SWELLING OR PAIN  UNUSUAL VAGINAL DISCHARGE OR ITCHING   Items with * indicate a potential emergency and should be  followed up as soon as possible or go to the Emergency Department if any problems should occur.  Please show the CHEMOTHERAPY ALERT CARD or IMMUNOTHERAPY ALERT CARD at check-in to the Emergency Department and triage nurse.  Should you have questions after your visit or need to cancel or reschedule your appointment, please contact [facility] AT [facility] [phone]  and follow the prompts.  Office hours are 8:00 a.m. to 4:30 p.m. Monday - Friday. Please note that voicemails left after 4:00 p.m. may not be returned until the following business day.  We are closed weekends and major holidays. You have access to a nurse at all times for urgent questions. Please call the main number to the clinic [phone] and follow the prompts.  For any non-urgent questions, you may also contact your provider using MyChart. We now offer e-Visits for anyone 40  and older to request care online for non-urgent symptoms. For details visit [email or web address].   Also download the MyChart app! Go to the app store, search "MyChart", open the app, select [place], and log in with your MyChart username and password.

## 2023-06-16 ENCOUNTER — Other Ambulatory Visit: Payer: Self-pay | Admitting: Cardiology

## 2023-06-16 LAB — CANCER ANTIGEN 27.29: CA 27.29: 17 U/mL (ref 0.0–38.6)

## 2023-06-16 LAB — CANCER ANTIGEN 15-3: CA 15-3: 18.1 U/mL (ref 0.0–25.0)

## 2023-06-22 ENCOUNTER — Ambulatory Visit (HOSPITAL_COMMUNITY)
Admission: RE | Admit: 2023-06-22 | Discharge: 2023-06-22 | Disposition: A | Discharge status: Home / Self Care | Payer: Medicare Other | Source: Ambulatory Visit | Attending: Hematology | Admitting: Hematology

## 2023-06-22 ENCOUNTER — Encounter (HOSPITAL_COMMUNITY)
Admission: RE | Admit: 2023-06-22 | Discharge: 2023-06-22 | Disposition: A | Discharge status: Home / Self Care | Payer: Medicare Other | Source: Ambulatory Visit | Attending: Hematology | Admitting: Hematology

## 2023-06-22 DIAGNOSIS — C50911 Malignant neoplasm of unspecified site of right female breast: Secondary | ICD-10-CM

## 2023-06-22 DIAGNOSIS — Z170 Estrogen receptor positive status [ER+]: Secondary | ICD-10-CM | POA: Insufficient documentation

## 2023-06-22 MED ORDER — IOHEXOL 300 MG/ML  SOLN
100.0000 mL | Freq: Once | INTRAMUSCULAR | Status: AC | PRN
  Administered 2023-06-22: 100 mL via INTRAVENOUS

## 2023-06-22 MED ORDER — TECHNETIUM TC 99M MEDRONATE IV KIT
20.0000 | PACK | Freq: Once | INTRAVENOUS | Status: AC | PRN
  Administered 2023-06-22: 20 via INTRAVENOUS

## 2023-06-25 ENCOUNTER — Inpatient Hospital Stay: Payer: Medicare Other

## 2023-06-25 ENCOUNTER — Inpatient Hospital Stay: Payer: Medicare Other | Admitting: Hematology

## 2023-07-01 ENCOUNTER — Other Ambulatory Visit: Payer: Self-pay | Admitting: Cardiology

## 2023-07-10 ENCOUNTER — Telehealth: Payer: Self-pay | Admitting: Cardiology

## 2023-07-10 ENCOUNTER — Encounter: Payer: Self-pay | Admitting: Cardiology

## 2023-07-10 ENCOUNTER — Ambulatory Visit: Payer: Medicare Other | Attending: Cardiology | Admitting: Cardiology

## 2023-07-10 VITALS — BP 107/55 | HR 73

## 2023-07-10 DIAGNOSIS — Z87891 Personal history of nicotine dependence: Secondary | ICD-10-CM

## 2023-07-10 DIAGNOSIS — I5032 Chronic diastolic (congestive) heart failure: Secondary | ICD-10-CM | POA: Diagnosis not present

## 2023-07-10 DIAGNOSIS — I2510 Atherosclerotic heart disease of native coronary artery without angina pectoris: Secondary | ICD-10-CM

## 2023-07-10 DIAGNOSIS — I110 Hypertensive heart disease with heart failure: Secondary | ICD-10-CM

## 2023-07-10 DIAGNOSIS — Z8679 Personal history of other diseases of the circulatory system: Secondary | ICD-10-CM

## 2023-07-10 DIAGNOSIS — I480 Paroxysmal atrial fibrillation: Secondary | ICD-10-CM | POA: Diagnosis not present

## 2023-07-10 DIAGNOSIS — I10 Essential (primary) hypertension: Secondary | ICD-10-CM

## 2023-07-10 NOTE — Progress Notes (Signed)
Virtual Visit via Telephone Note   Because of Stacey [name]'s co-morbid illnesses, she is at least at moderate risk for complications without adequate follow up.  This format is felt to be most appropriate for this patient at this time.  The patient did not have access to video technology/had technical difficulties with video requiring transitioning to audio format only (telephone).  All issues noted in this document were discussed and addressed.  No physical exam could be performed with this format.  Please refer to the patient's chart for her consent to telehealth for [facility].    Date:  07/10/2023   ID:  Stacey [name], DOB 1946/04/28, MRN [id number] The patient was identified using 2 identifiers.  Patient Location: Home Provider Location: Office/Clinic   PCP:  Ignatius Specking, MD    [facility] Cardiologist:  Dina Rich, MD {   Evaluation Performed:  Follow-Up Visit  Chief Complaint:  Follow up visit  History of Present Illness:    Stacey is a 77 y.o. female seen today for follow up of the following medical problems.      1. Chronic systolic HF/ICM - Jan 2020 echo LVEF 20-25%, restrictive diastolic function, mild AS, PASP 40 - 10/2017 echo LVEF 50%, dropped to 30-35% Jan 2019 and has remained decreased   - chronic left leg edema thought to be venous insufficiency   From prior cardiologist: "She is not a candidate for device therapy given her metastatic cancer and numerous comorbidities and poor long-term prognosis. She has been scheduled to establish care with the palliative care team which I think is quite appropriate".  - has been hesitant to change to entresto due to concern about cost      03/2020 LVEF 35-40%   03/2021 echo LVEF 50-55%, grade I dd - no recent edema, no SOB/DOE  - compliant with meds   01/2023 echo [facility]: EF >55% - no recent SOB, no recent edema     2. CAD - notes incidate history of DES to  ramus, late stent thrombosis requiring PTCA in 2006. DES to RCA in Dec 2018 - SOB on brillinta, previously changed to plavix.    -denies any of chest pain       3. Mild aortic stenosis - Jan 2020 echo mean grad 11, AVA VTI 1.69 - 2021 mild to mod AS   03/2021 echo mild moderate AS: mean grad 10.5, DI 0.31, AVA VTI 1.09  01/2023 echo [facility]: reports no AS, however I cannot see any AV measurements included in the report.    4. Chronic LBBB     5. . Metastatic breast cancer/Chronic pain - followed at [facility]. Metastatic breast cancer to the bone and renal lesion, has pulmonary nodules. On radiation. - has been seen by palliative care previously - from 03/08/20 onc note increased size of renal lesion, to f/u with urology. Mention concern for additional primary such as renal cell CA.    - recent leg weakness, progressing back pain. Had PET scan and MRI at [facility] within the last few weeks, she is waiting to hear back from neurosurgery     6. Afib - off coumadin for intrathecal pain pump through palliative pain management at [facility]   -with recent PE has been started on eliquis  - some palpitations 2months ago    7. Recent lap chole 03/2021 - later required incarcerated umbilical hernia repair 03/2021     8. HTN - compliant with meds - home  bps 100-120s/50s-60s   9. Chronic back pain - preop eval 01/2023 for back surgery  10.Pulmonary embolism - 03/2023 CT PE at [facility]: +PE -on eliquis, we have deferred duration to heme/onc. Immobile with metastatic cancer, would think likely indefinite anticoag   Past Medical History:  Diagnosis Date   CAD in native artery    a. DES to ramus 2005 with late stent thrombosis 2006 tx with PTCA. 12/18 PCI/DES x1 to mRCA, EF 50-55%   CHF (congestive heart failure) (HCC)    Chronic pain    COPD (chronic obstructive pulmonary disease) (HCC)    Hyperlipidemia    Hypertension    Left bundle  block    Lymphedema    Metastatic breast cancer    a. to  bone.   MI (myocardial infarction) (HCC)    Mild aortic stenosis 10/2017   Morbid obesity (HCC)    PAF (paroxysmal atrial fibrillation) (HCC)    PSVT (paroxysmal supraventricular tachycardia)    a. per [facility] notes, seen on event monitor in 2014.   Pulmonary nodules    Past Surgical History:  Procedure Laterality Date   CHOLECYSTECTOMY N/A 03/29/2021   Procedure: LAPAROSCOPIC CHOLECYSTECTOMY;  Surgeon: Lucretia Roers, MD;  Location: [facility]S;  Service: General;  Laterality: N/A;   CORONARY STENT INTERVENTION N/A 11/26/2017   Procedure: CORONARY STENT INTERVENTION;  Surgeon: Swaziland, Peter M, MD;  Location: [facility] INVASIVE CV LAB;  Service: Cardiovascular;  Laterality: N/A;   CORONARY STENT PLACEMENT     ERCP N/A 03/28/2021   Procedure: ENDOSCOPIC RETROGRADE CHOLANGIOPANCREATOGRAPHY (ERCP);  Surgeon: Malissa Hippo, MD;  Location: [facility];  Service: Endoscopy;  Laterality: N/A;   intrathecal pain pump     LEFT HEART CATH AND CORONARY ANGIOGRAPHY N/A 11/26/2017   Procedure: LEFT HEART CATH AND CORONARY ANGIOGRAPHY;  Surgeon: Swaziland, Peter M, MD;  Location: [facility] INVASIVE CV LAB;  Service: Cardiovascular;  Laterality: N/A;   REMOVAL OF STONES  03/28/2021   Procedure: REMOVAL OF STONES;  Surgeon: Malissa Hippo, MD;  Location: [facility];  Service: Endoscopy;;   SPHINCTEROTOMY  03/28/2021   Procedure: SPHINCTEROTOMY;  Surgeon: Malissa Hippo, MD;  Location: [facility];  Service: Endoscopy;;   UMBILICAL HERNIA REPAIR N/A 04/03/2021   Procedure: ADULT PRIMARY UMBILICAL HERNIA REPAIR;  Surgeon: Lucretia Roers, MD;  Location: [facility];  Service: General;  Laterality: N/A;   VASCULAR SURGERY       Current Meds  Medication Sig   acetaminophen (TYLENOL) 325 MG tablet Take 2 tablets (650 mg total) by mouth every 6 (six) hours as needed for mild pain, fever or headache.   amLODipine (NORVASC) 5 MG tablet TAKE 1 TABLET EVERY DAY   apixaban (ELIQUIS) 5 MG TABS tablet Take 5 mg by mouth 2 (two) times daily.    aspirin EC 81 MG tablet Take 1 tablet (81 mg total) by mouth daily with breakfast. Swallow whole.   atorvastatin (LIPITOR) 80 MG tablet TAKE 1 TABLET EVERY MORNING   b complex vitamins tablet Take 1 tablet by mouth every morning.   carboxymethylcellulose (REFRESH PLUS) 0.5 % SOLN Place 1 drop into both eyes as needed.   clopidogrel (PLAVIX) 75 MG tablet TAKE 1 TABLET EVERY DAY   DULoxetine (CYMBALTA) 30 MG capsule Take 3 capsules (90 mg total) by mouth daily. (Patient taking differently: Take 30 mg by mouth 2 (two) times daily.)   ferrous sulfate 325 (65 FE) MG tablet Take 325 mg by mouth daily.   HYDROmorphone (DILAUDID) 2  MG tablet Take 1 tablet (2 mg total) by mouth every 6 (six) hours as needed for severe pain (breakthrough pain).   losartan (COZAAR) 100 MG tablet TAKE 1 TABLET EVERY DAY   metoprolol succinate (TOPROL-XL) 100 MG 24 hr tablet TAKE 1 AND 1/2 TABLETS EVERY DAY. DOSE INCREASE.   Multiple Vitamin (MULTIVITAMIN ADULT PO) Take 1 tablet by mouth daily.   Multiple Vitamins-Minerals (EQ VISION FORMULA 50+ PO) Take 1 tablet by mouth every morning.   naloxone (NARCAN) nasal spray 4 mg/0.1 mL Place into the nose.   nitroGLYCERIN (NITROSTAT) 0.4 MG SL tablet Place 1 tablet (0.4 mg total) under the tongue every 5 (five) minutes x 3 doses as needed for chest pain (if no relief after 3rd dose, proceed to the ED or call 911).   oxyCODONE (OXY IR/ROXICODONE) 5 MG immediate release tablet Take by mouth.   Oxycodone HCl 10 MG TABS Take 10 mg by mouth 3 (three) times daily as needed.   PAIN MANAGEMENT INTRATHECAL, IT, PUMP by Intrathecal route.   pregabalin (LYRICA) 50 MG capsule Take 50 mg by mouth daily as needed (pain).     Allergies:   Brilinta [ticagrelor], Budesonide, and Albuterol   Social History   Tobacco Use   Smoking status: Former    Current packs/day: 0.00    Average packs/day: 2.0 packs/day for 18.0 years (36.0 ttl pk-yrs)    Types: Cigarettes    Start date: 12/01/1962     Quit date: 12/01/1980    Years since quitting: 42.6   Smokeless tobacco: Never  Vaping Use   Vaping status: Never Used  Substance Use Topics   Alcohol use: No   Drug use: Yes    Types: Oxycodone, Morphine    Comment: takes methadone     Family Hx: The patient's family history includes CAD in her paternal grandmother; CAD (age of onset: 46) in her father; COPD in her sister; Heart attack in her father. There is no history of Sudden Cardiac Death, Colon polyps, or Colon cancer.  ROS:   Please see the history of present illness.     All other systems reviewed and are negative.   Prior CV studies:   The following studies were reviewed today:  01/2023 echo [facility] INTERPRETATION ---------------------------------------------------------------    NORMAL LEFT VENTRICULAR SYSTOLIC FUNCTION    NORMAL RIGHT VENTRICULAR SYSTOLIC FUNCTION    VALVULAR REGURGITATION: TRIVIAL PR, TRIVIAL TR    NO VALVULAR STENOSIS    NO PRIOR STUDY FOR COMPARISON    Labs/Other Tests and Data Reviewed:    EKG:  No ECG reviewed.  Recent Labs: 03/02/2023: Magnesium 2.0 06/15/2023: ALT 15; BUN 16; Creatinine, Ser 0.60; Hemoglobin 10.2; Platelets 185; Potassium 4.7; Sodium 140   Recent Lipid Panel No results found for: "CHOL", "TRIG", "HDL", "CHOLHDL", "LDLCALC", "LDLDIRECT"  Wt Readings from Last 3 Encounters:  04/07/23 215 lb (97.5 kg)  03/15/23 215 lb (97.5 kg)  02/26/23 205 lb 0.4 oz (93 kg)     Risk Assessment/Calculations:          Objective:    Vital Signs:  BP (!) 107/55   Pulse 73    Normal affect. Normal speech pattern and tone. Comfortable, no apparent distress. No audible signs of sob or wheezing.   ASSESSMENT & PLAN:    1. HFimpEF - LVEF has normalized - denies any symptoms, continue curren tmeds   2. CAD - had been on extended DAPT due to history of late stent thrombosis - now that she  is on eliquis would d/c ASA, continue plavix and eliquis.    3. PAF -off coumadin since  intrathecal pain pump placement at [facility] after discussions with pain clinic - she is back on anticoag however after PE, currently on eliquis - isolated short duration of palpitations, continue current meds   4. HTN - she is at goal, continue current meds   5. Aortic stenosis - recent echo [facility] reports no stenosis but no parameters are included in the study - from prior echos mild to mod AS, low than expected gradient give low SVI, paradoxical low flow stenosi - no indication for repeat study at this time. With malignant cancer and immobility likely would not plan on ongoing surveillance at this time but can discuss over time.     Time:   Today, I have spent 22 minutes with the patient with telehealth technology discussing the above problems.     Medication Adjustments/Labs and Tests Ordered: Current medicines are reviewed at length with the patient today.  Concerns regarding medicines are outlined above.   Tests Ordered: No orders of the defined types were placed in this encounter.   Medication Changes: No orders of the defined types were placed in this encounter.   Follow Up:  Virtual or in person 6 months. Limited mobility due to severe spinal stenosis, metastatic cancer.   Joanie Coddington, MD  07/10/2023 10:26 AM    [facility]

## 2023-07-10 NOTE — Telephone Encounter (Signed)
  Patient Consent for Virtual Visit        Stacey [name] has provided verbal consent on 07/10/2023 for a virtual visit (video or telephone).   CONSENT FOR VIRTUAL VISIT FOR:  [name]  By participating in this virtual visit I agree to the following:  I hereby voluntarily request, consent and authorize [facility] and its employed or contracted physicians, physician assistants, nurse practitioners or other licensed health care professionals (the Practitioner), to provide me with telemedicine health care services (the "Services") as deemed necessary by the treating Practitioner. I acknowledge and consent to receive the Services by the Practitioner via telemedicine. I understand that the telemedicine visit will involve communicating with the Practitioner through live audiovisual communication technology and the disclosure of certain medical information by electronic transmission. I acknowledge that I have been given the opportunity to request an in-person assessment or other available alternative prior to the telemedicine visit and am voluntarily participating in the telemedicine visit.  I understand that I have the right to withhold or withdraw my consent to the use of telemedicine in the course of my care at any time, without affecting my right to future care or treatment, and that the Practitioner or I may terminate the telemedicine visit at any time. I understand that I have the right to inspect all information obtained and/or recorded in the course of the telemedicine visit and may receive copies of available information for a reasonable fee.  I understand that some of the potential risks of receiving the Services via telemedicine include:  Delay or interruption in medical evaluation due to technological equipment failure or disruption; Information transmitted may not be sufficient (e.g. poor resolution of images) to allow for appropriate medical decision making by the  Practitioner; and/or  In rare instances, security protocols could fail, causing a breach of personal health information.  Furthermore, I acknowledge that it is my responsibility to provide information about my medical history, conditions and care that is complete and accurate to the best of my ability. I acknowledge that Practitioner's advice, recommendations, and/or decision may be based on factors not within their control, such as incomplete or inaccurate data provided by me or distortions of diagnostic images or specimens that may result from electronic transmissions. I understand that the practice of medicine is not an exact science and that Practitioner makes no warranties or guarantees regarding treatment outcomes. I acknowledge that a copy of this consent can be made available to me via my patient portal [facility] MyChart), or I can request a printed copy by calling the office of [facility].    I understand that my insurance will be billed for this visit.   I have read or had this consent read to me. I understand the contents of this consent, which adequately explains the benefits and risks of the Services being provided via telemedicine.  I have been provided ample opportunity to ask questions regarding this consent and the Services and have had my questions answered to my satisfaction. I give my informed consent for the services to be provided through the use of telemedicine in my medical care

## 2023-07-10 NOTE — Telephone Encounter (Signed)
  Patient Consent for Virtual Visit        Stacey [name] has provided verbal consent on 07/10/2023 for a virtual visit (video or telephone).   CONSENT FOR VIRTUAL VISIT FOR:  Stacey [name]  By participating in this virtual visit I agree to the following:  I hereby voluntarily request, consent and authorize [facility] and its employed or contracted physicians, physician assistants, nurse practitioners or other licensed health care professionals (the Practitioner), to provide me with telemedicine health care services (the "Services") as deemed necessary by the treating Practitioner. I acknowledge and consent to receive the Services by the Practitioner via telemedicine. I understand that the telemedicine visit will involve communicating with the Practitioner through live audiovisual communication technology and the disclosure of certain medical information by electronic transmission. I acknowledge that I have been given the opportunity to request an in-person assessment or other available alternative prior to the telemedicine visit and am voluntarily participating in the telemedicine visit.  I understand that I have the right to withhold or withdraw my consent to the use of telemedicine in the course of my care at any time, without affecting my right to future care or treatment, and that the Practitioner or I may terminate the telemedicine visit at any time. I understand that I have the right to inspect all information obtained and/or recorded in the course of the telemedicine visit and may receive copies of available information for a reasonable fee.  I understand that some of the potential risks of receiving the Services via telemedicine include:  Delay or interruption in medical evaluation due to technological equipment failure or disruption; Information transmitted may not be sufficient (e.g. poor resolution of images) to allow for appropriate medical decision making by the  Practitioner; and/or  In rare instances, security protocols could fail, causing a breach of personal health information.  Furthermore, I acknowledge that it is my responsibility to provide information about my medical history, conditions and care that is complete and accurate to the best of my ability. I acknowledge that Practitioner's advice, recommendations, and/or decision may be based on factors not within their control, such as incomplete or inaccurate data provided by me or distortions of diagnostic images or specimens that may result from electronic transmissions. I understand that the practice of medicine is not an exact science and that Practitioner makes no warranties or guarantees regarding treatment outcomes. I acknowledge that a copy of this consent can be made available to me via my patient portal [name]), or I can request a printed copy by calling the office of [name].    I understand that my insurance will be billed for this visit.   I have read or had this consent read to me. I understand the contents of this consent, which adequately explains the benefits and risks of the Services being provided via telemedicine.  I have been provided ample opportunity to ask questions regarding this consent and the Services and have had my questions answered to my satisfaction. I give my informed consent for the services to be provided through the use of telemedicine in my medical care

## 2023-07-10 NOTE — Patient Instructions (Addendum)
Medication Instructions:   STOP Aspirin  Labwork: None today  Testing/Procedures: None today  Follow-Up: 6 months telephone visit  FEBRUARY 12 TH AT 4 PM   Any Other Special Instructions Will Be Listed Below (If Applicable).  If you need a refill on your cardiac medications before your next appointment, please call your pharmacy.

## 2023-07-13 ENCOUNTER — Other Ambulatory Visit: Payer: Self-pay

## 2023-07-13 DIAGNOSIS — C50911 Malignant neoplasm of unspecified site of right female breast: Secondary | ICD-10-CM

## 2023-07-13 NOTE — Progress Notes (Signed)
[facility] [address] [place] [address]    Clinic Day:  07/14/2023  Referring physician: Ignatius Specking, MD  Patient Care Team: Ignatius Specking, MD as PCP - General (Internal Medicine) Wyline Mood Dorothe Pea, MD as PCP - Cardiology (Cardiology) Therese Sarah, RN as Oncology Nurse Navigator (Oncology)   ASSESSMENT & PLAN:   Assessment: 1.  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 -Ultrasound-guided biopsy on 01/07/2016 showed grade 1 invasive ductal carcinoma, ER positive, PR positive, HER-2 equivocal by FISH, right axillary lymph node with metastatic adenocarcinoma.  HER-2 equivocal since her 2/CEP17 ratio less than 2 and average copy number of her 2 is greater than or equal to 4 but less than 6 signals per cell. -Bone biopsy on 05/30/2016 consistent with metastatic breast cancer. -Palliative therapy with letrozole started under the direction of Dr. Roland Earl at [facility] from 02/25/2016 through February 2020, progressive symptoms with increased soft tissue extension at L2. 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 -Addition of Ilda Foil was discussed but she was concerned about cost of the medication. -CT abdomen and pelvis on 08/13/2020 shows acute compression fracture deformity of the L1 vertebral body and other chronic findings. -Her last Faslodex injection at [facility] was on 11/06/2020. -CT CAP on 02/19/2021 with no evidence of for solid organ metastasis or nodal metastasis.  Right breast lesion is stable.  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 -Bone scan on 02/19/2021 was negative for bone mets.   2.  Social/family history: -She worked for [employer] prior to retirement in 2010. -She is  currently living with her son who is helping with her day-to-day activities.  Daughter lives in [place].  She quit smoking 35 to 40 years ago. -She is mostly confined to bed.  However she is able to change clothes, able to wash upper body, halfway dress herself.  She cannot stand up for the past 2 years because of weakness in the legs and knee arthritis.  She wears adult diapers.  Plan: 1.  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  Unchanged bone metastasis at L1 and L2.  Unchanged mass of the peripheral superior pole of the left kidney 2.7 x 2.7 cm. - Reviewed bone scan from 06/22/2023: Stable disease. - Brain MRI on 03/16/2023: No metastatic disease done at [facility]. - She reports no control of the right leg and very little control of the left leg. - Continue Faslodex monthly. - RTC 4 months for follow-up with repeat CT CAP and tumor markers.     2.  Cancer associated pain/RLQ pain: - She has morphine/bupivacaine intrathecal pump.  She reports that pain is poorly controlled.  Continue follow-up with [facility] pain management clinic.  She has an appointment with them.  3.  Left kidney upper pole mass: - Current CT scan shows stable mass measuring 2.7 x 2.7 cm.  4.  Bone metastasis: - Continue monthly denosumab.  5.  Pulmonary embolism (Dx 03/16/2023): - CT [facility] at [facility] showed nonocclusive pulmonary embolism in the left lower lobe segmental artery. - Continue Eliquis twice daily.  Orders Placed This Encounter  Procedures   CT CHEST ABDOMEN PELVIS W CONTRAST    Standing Status:   Future    Standing Expiration Date:   07/13/2024    Order Specific Question:   If indicated for the ordered procedure, I authorize the administration of contrast media per Radiology protocol    Answer:   Yes    Order Specific Question:   Does the patient have a contrast media/X-ray dye allergy?    Answer:   No     Order Specific Question:   Preferred imaging location?    Answer:   [facility]    Order Specific Question:   Release to patient    Answer:   Immediate    Order Specific Question:   If indicated for the ordered procedure, I authorize the administration of oral contrast media per Radiology protocol    Answer:   Yes   Cancer antigen 15-3    Standing Status:   Future    Standing Expiration Date:   07/13/2024   Cancer antigen 27.29    Standing Status:   Future    Standing Expiration Date:   07/13/2024      Stacey [name],acting as a scribe for [name], MD.,have documented all relevant documentation on the behalf of [name], MD,as directed by  [name], MD while in the presence of [name], MD.  I, [name] MD, have reviewed the above documentation for accuracy and completeness, and I agree with the above.    [name], MD   9/17/20245:39 PM  CHIEF COMPLAINT:   Diagnosis: metastatic right breast cancer to the bones    Cancer Staging  Metastatic breast cancer Staging form: Breast, AJCC 8th Edition - Clinical stage from 02/07/2021: Stage IV (cT2, cN1, pM1, G1, ER+, PR+, HER2: Equivocal) - Signed by [name], MD on 02/07/2021    Prior Therapy:  Palliative therapy with letrozole started under the direction of Dr. Roland Earl at [facility] from 02/25/2016 through February 2020   Current Therapy:  She was transitioned to fulvestrant in February 2020- still receiving monthly Faslodex   HISTORY OF PRESENT ILLNESS:   Oncology History  Metastatic breast cancer  11/23/2017 Initial Diagnosis   Metastatic breast cancer (HCC)   02/07/2021 Cancer Staging   Staging form: Breast, AJCC 8th Edition - Clinical stage from 02/07/2021: Stage IV (cT2, cN1, pM1, G1, ER+, PR+, HER2: Equivocal) - Signed by [name], MD on 02/07/2021 Histopathologic type: Infiltrating duct carcinoma, NOS Stage prefix:  Initial diagnosis Histologic grading system: 3 grade system   02/07/2021 - 02/07/2021 Chemotherapy            INTERVAL HISTORY:   Stacey [name] is a 77 y.o. female presenting to clinic today for follow up of metastatic right breast cancer to the bones. She was last seen by me on 03/09/23.  Since her last visit, she underwent a CT C/A/P on 06/22/23 that found: unchanged sclerotic osseous metastases of L1 and L2 with a high-grade wedge deformity of L1 and retropulsion of fracture fragments; unchanged sclerotic osseous metastasis of the left inferior pubic ramus; unchanged partially calcified mass of the peripheral right breast; unchanged mass of the peripheral superior pole of the left kidney measuring 2.7 x 2.7 cm, consistent with an incidental renal cell carcinoma; unchanged spiculated nodule of the right pulmonary apex measuring 0.6 cm, stable over multiple prior examinations and presumably benign scarring; diffuse thickening of the underdistended urinary bladder; severe aortic atherosclerosis, with an unchanged calcified, 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   She also had an NM whole body scan on 06/22/23 that found: probable linear activity on posterior views in the region of known L1 compression fracture and otherwise similar pattern of degenerative activity without new areas of osseous uptake.  Of note, she presented to the ED on 4/14, 4/15 and 5/7 for chronic lower back pain.   Today, she states that she is doing well overall. Her appetite level is at 100%. Her energy level is at 75%.   PAST MEDICAL HISTORY:   Past Medical History: Past Medical History:  Diagnosis Date   CAD in native artery    a. DES to ramus 2005 with late stent thrombosis 2006 tx with PTCA. 12/18 PCI/DES x1 to mRCA, EF 50-55%   CHF (congestive heart failure) (HCC)    Chronic pain    COPD (chronic obstructive pulmonary disease) (HCC)    Hyperlipidemia    Hypertension     Left bundle branch block    Lymphedema    Metastatic breast cancer    a. to bone.   MI (myocardial infarction) (HCC)    Mild aortic stenosis 10/2017   Morbid obesity (HCC)    PAF (paroxysmal atrial fibrillation) (HCC)    PSVT (paroxysmal supraventricular tachycardia)    a. per [facility] notes, seen on event monitor in 2014.   Pulmonary nodules     Surgical History: Past Surgical History:  Procedure Laterality Date   CHOLECYSTECTOMY N/A 03/29/2021   Procedure: LAPAROSCOPIC CHOLECYSTECTOMY;  Surgeon: Lucretia Roers, MD;  Location: [facility];  Service: General;  Laterality: N/A;   CORONARY STENT INTERVENTION N/A 11/26/2017   Procedure: CORONARY STENT INTERVENTION;  Surgeon: Swaziland, Peter M, MD;  Location: [facility] INVASIVE CV LAB;  Service: Cardiovascular;  Laterality: N/A;   CORONARY STENT PLACEMENT     ERCP N/A 03/28/2021   Procedure: ENDOSCOPIC RETROGRADE CHOLANGIOPANCREATOGRAPHY (ERCP);  Surgeon: Malissa Hippo, MD;  Location: [facility];  Service: Endoscopy;  Laterality: N/A;   intrathecal pain pump     LEFT HEART CATH AND CORONARY ANGIOGRAPHY N/A 11/26/2017   Procedure: LEFT HEART CATH AND CORONARY ANGIOGRAPHY;  Surgeon: Swaziland, Peter M, MD;  Location: [facility] INVASIVE CV LAB;  Service: Cardiovascular;  Laterality: N/A;   REMOVAL OF STONES  03/28/2021   Procedure: REMOVAL OF STONES;  Surgeon: Malissa Hippo, MD;  Location: [facility];  Service: Endoscopy;;   SPHINCTEROTOMY  03/28/2021   Procedure: SPHINCTEROTOMY;  Surgeon: Malissa Hippo, MD;  Location: [facility];  Service: Endoscopy;;   UMBILICAL HERNIA REPAIR N/A 04/03/2021   Procedure: ADULT PRIMARY UMBILICAL HERNIA REPAIR;  Surgeon: Lucretia Roers, MD;  Location: [facility];  Service: General;  Laterality: N/A;   VASCULAR SURGERY      Social History: Social History   Socioeconomic History   Marital status: Single    Spouse name: Not on file   Number of children: Not on file   Years of education: Not on file   Highest education level: Not  on file  Occupational History   Occupation: Retired  Tobacco Use   Smoking status: Former    Current packs/day: 0.00    Average packs/day: 2.0 packs/day for 18.0 years (36.0 ttl pk-yrs)    Types: Cigarettes    Start date: 12/01/1962    Quit date: 12/01/1980    Years since quitting: 42.7   Smokeless tobacco: Never  Vaping Use   Vaping status: Never Used  Substance and Sexual Activity   Alcohol  use: No   Drug use: Yes    Types: Oxycodone, Morphine    Comment: takes methadone   Sexual activity: Not Currently  Other Topics Concern   Not on file  Social History Narrative   Not on file   Social Determinants of Health   Financial Resource Strain: Medium Risk (08/17/2023)   Received from [facility]   Overall Financial Resource Strain (CARDIA)    Difficulty of Paying Living Expenses: Somewhat hard  Food Insecurity: Food Insecurity Present (08/17/2023)   Received from [facility]   Hunger Vital Sign    Worried About Running Out of Food in the Last Year: Sometimes true    Ran Out of Food in the Last Year: Sometimes true  Transportation Needs: No Transportation Needs (08/17/2023)   Received from [facility] - Transportation    In the past 12 months, has lack of transportation kept you from medical appointments or from getting medications?: No    Lack of Transportation (Non-Medical): No  Physical Activity: Inactive (08/17/2023)   Received from [facility]   Exercise Vital Sign    Days of Exercise per Week: 0 days    Minutes of Exercise per Session: 0 min  Stress: No Stress Concern Present (08/17/2023)   Received from [facility] - Occupational Stress Questionnaire    Feeling of Stress : Only a little  Social Connections: Moderately Isolated (08/17/2023)   Received from [facility]   Social Connection and Isolation Panel [NHANES]     Frequency of Communication with Friends and Family: Three times a week    Frequency of Social Gatherings with Friends and Family: Three times a week    Attends Religious Services: More than 4 times per year    Active Member of Clubs or Organizations: No    Attends Banker Meetings: Never    Marital Status: Divorced  Catering manager Violence: Not At Risk (02/07/2021)   Humiliation, Afraid, Rape, and Kick questionnaire    Fear of Current or Ex-Partner: No    Emotionally Abused: No    Physically Abused: No    Sexually Abused: No    Family History: Family History  Problem Relation Age of Onset   CAD Father 82   Heart attack Father    COPD Sister    CAD Paternal Grandmother    Sudden Cardiac Death Neg Hx    Colon polyps Neg Hx    Colon cancer Neg Hx     Current Medications:  Current Outpatient Medications:    acetaminophen (TYLENOL) 325 MG tablet, Take 2 tablets (650 mg total) by mouth every 6 (six) hours as needed for mild pain, fever or headache., Disp: 12 tablet, Rfl: 0   amLODipine (NORVASC) 5 MG tablet, TAKE 1 TABLET EVERY DAY, Disp: 90 tablet, Rfl: 3   apixaban (ELIQUIS) 5 MG TABS tablet, Take 5 mg by mouth 2 (two) times daily., Disp: , Rfl:    b complex vitamins tablet, Take 1 tablet by mouth every morning., Disp: 30 tablet, Rfl: 0   carboxymethylcellulose (REFRESH PLUS) 0.5 % SOLN, Place 1 drop into both eyes as needed., Disp: , Rfl:    clopidogrel (PLAVIX) 75 MG tablet, TAKE 1 TABLET EVERY DAY, Disp: 90 tablet, Rfl: 1   DULoxetine (CYMBALTA) 30 MG capsule, Take 3 capsules (90 mg total) by mouth daily. (Patient taking differently: Take 30 mg  by mouth 2 (two) times daily.), Disp: 30 capsule, Rfl: 0   ferrous sulfate 325 (65 FE) MG tablet, Take 325 mg by mouth daily., Disp: , Rfl:    HYDROmorphone (DILAUDID) 2 MG tablet, Take 1 tablet (2 mg total) by mouth every 6 (six) hours as needed for severe pain (breakthrough pain)., Disp: 15 tablet, Rfl: 0   losartan  (COZAAR) 100 MG tablet, TAKE 1 TABLET EVERY DAY, Disp: 90 tablet, Rfl: 1   metoprolol succinate (TOPROL-XL) 100 MG 24 hr tablet, TAKE 1 AND 1/2 TABLETS EVERY DAY. DOSE INCREASE., Disp: 135 tablet, Rfl: 3   Multiple Vitamin (MULTIVITAMIN ADULT PO), Take 1 tablet by mouth daily., Disp: , Rfl:    Multiple Vitamins-Minerals (EQ VISION FORMULA 50+ PO), Take 1 tablet by mouth every morning., Disp: , Rfl:    naloxone (NARCAN) nasal spray 4 mg/0.1 mL, Place into the nose., Disp: , Rfl:    nitroGLYCERIN (NITROSTAT) 0.4 MG SL tablet, Place 1 tablet (0.4 mg total) under the tongue every 5 (five) minutes x 3 doses as needed for chest pain (if no relief after 3rd dose, proceed to the ED or call 911)., Disp: 25 tablet, Rfl: 3   oxyCODONE (OXY IR/ROXICODONE) 5 MG immediate release tablet, Take by mouth., Disp: , Rfl:    Oxycodone HCl 10 MG TABS, Take 10 mg by mouth 3 (three) times daily as needed., Disp: , Rfl:    PAIN MANAGEMENT INTRATHECAL, IT, PUMP, by Intrathecal route., Disp: , Rfl:    pregabalin (LYRICA) 50 MG capsule, Take 50 mg by mouth daily as needed (pain)., Disp: , Rfl:    atorvastatin (LIPITOR) 80 MG tablet, TAKE 1 TABLET EVERY MORNING, Disp: 90 tablet, Rfl: 3   [START ON 09/03/2023] methadone (DOLOPHINE) 5 MG tablet, Take 5 mg by mouth every 8 (eight) hours., Disp: , Rfl:    methocarbamol (ROBAXIN) 500 MG tablet, Take 500 mg by mouth every 8 (eight) hours as needed for muscle spasms., Disp: , Rfl:    Allergies: Allergies  Allergen Reactions   Brilinta [Ticagrelor] Diarrhea and Nausea Only    Nausea and severe diarrhea- general weakness. Patient says does not want to take again   Budesonide Palpitations   Albuterol Other (See Comments)    Heart racing     REVIEW OF SYSTEMS:   Review of Systems  Constitutional:  Negative for chills, fatigue and fever.  HENT:   Negative for lump/mass, mouth sores, nosebleeds, sore throat and trouble swallowing.   Eyes:  Negative for eye problems.   Respiratory:  Negative for cough and shortness of breath.   Cardiovascular:  Negative for chest pain, leg swelling and palpitations.  Gastrointestinal:  Negative for abdominal pain, constipation, diarrhea, nausea and vomiting.  Genitourinary:  Negative for bladder incontinence, difficulty urinating, dysuria, frequency, hematuria and nocturia.   Musculoskeletal:  Negative for arthralgias, back pain, flank pain, myalgias and neck pain.  Skin:  Negative for itching and rash.  Neurological:  Negative for dizziness, headaches and numbness.  Hematological:  Does not bruise/bleed easily.  Psychiatric/Behavioral:  Negative for depression, sleep disturbance and suicidal ideas. The patient is not nervous/anxious.   All other systems reviewed and are negative.    VITALS:   There were no vitals taken for this visit.  Wt Readings from Last 3 Encounters:  04/07/23 215 lb (97.5 kg)  03/15/23 215 lb (97.5 kg)  02/26/23 205 lb 0.4 oz (93 kg)    There is no height or weight on file to calculate  BMI.  Performance status (ECOG): 4 - Bedbound  PHYSICAL EXAM:   Physical Exam Vitals and nursing note reviewed. Exam conducted with a chaperone present.  Constitutional:      Appearance: Normal appearance.  Cardiovascular:     Rate and Rhythm: Normal rate and regular rhythm.     Pulses: Normal pulses.     Heart sounds: Normal heart sounds.  Pulmonary:     Effort: Pulmonary effort is normal.     Breath sounds: Normal breath sounds.  Abdominal:     Palpations: Abdomen is soft. There is no hepatomegaly, splenomegaly or mass.     Tenderness: There is no abdominal tenderness.  Musculoskeletal:     Right lower leg: No edema.     Left lower leg: No edema.  Lymphadenopathy:     Cervical: No cervical adenopathy.     Right cervical: No superficial, deep or posterior cervical adenopathy.    Left cervical: No superficial, deep or posterior cervical adenopathy.     Upper Body:     Right upper body: No  supraclavicular or axillary adenopathy.     Left upper body: No supraclavicular or axillary adenopathy.  Neurological:     General: No focal deficit present.     Mental Status: She is alert and oriented to person, place, and time.  Psychiatric:        Mood and Affect: Mood normal.        Behavior: Behavior normal.    Breast Exam Chaperone: Anne Fu, LPN   LABS:      Latest Ref Rng & Units 08/12/2023   11:03 AM 07/14/2023   12:38 PM 06/15/2023   12:53 PM  CBC  WBC 4.0 - 10.5 K/uL 4.2  5.2  4.3   Hemoglobin 12.0 - 15.0 g/dL 16.1  09.6  04.5   Hematocrit 36.0 - 46.0 % 33.0  33.6  32.0   Platelets 150 - 400 K/uL 173  160  185       Latest Ref Rng & Units 08/12/2023   11:03 AM 07/14/2023   12:38 PM 06/15/2023   12:53 PM  CMP  Glucose 70 - 99 mg/dL 409  811  914   BUN 8 - 23 mg/dL 15  16  16    Creatinine 0.44 - 1.00 mg/dL 7.82  9.56  2.13   Sodium 135 - 145 mmol/L 139  139  140   Potassium 3.5 - 5.1 mmol/L 4.5  4.2  4.7   Chloride 98 - 111 mmol/L 99  102  103   CO2 22 - 32 mmol/L 34  31  32   Calcium 8.9 - 10.3 mg/dL 8.8  8.6  8.6   Total Protein 6.5 - 8.1 g/dL 5.7  5.5  5.4   Total Bilirubin 0.3 - 1.2 mg/dL 1.1  0.6  0.7   Alkaline Phos 38 - 126 U/L 50  46  40   AST 15 - 41 U/L 21  19  19    ALT 0 - 44 U/L 14  15  15     Component     Latest Ref Rng 09/09/2022 12/08/2022 03/02/2023  CA 15-3     0.0 - 25.0 U/mL 18.0  19.8  18.0     Component     Latest Ref Rng 09/09/2022 12/08/2022 03/02/2023  CA 27.29     0.0 - 38.6 U/mL 21.2  17.9  15.3     No results found for: "CEA1", "CEA" / No results found for: "CEA1", "CEA"  No results found for: "PSA1" No results found for: "QVZ563" No results found for: "CAN125"  No results found for: "TOTALPROTELP", "ALBUMINELP", "A1GS", "A2GS", "BETS", "BETA2SER", "GAMS", "MSPIKE", "SPEI" Lab Results  Component Value Date   TIBC 273 09/09/2022   TIBC 289 06/09/2022   TIBC 274 10/01/2021   FERRITIN 47 09/09/2022   FERRITIN 36 06/09/2022    FERRITIN 67 10/01/2021   IRONPCTSAT 25 09/09/2022   IRONPCTSAT 23 06/09/2022   IRONPCTSAT 20 10/01/2021   No results found for: "LDH"   STUDIES:   No results found.

## 2023-07-14 ENCOUNTER — Inpatient Hospital Stay: Payer: Medicare Other

## 2023-07-14 ENCOUNTER — Inpatient Hospital Stay (HOSPITAL_BASED_OUTPATIENT_CLINIC_OR_DEPARTMENT_OTHER): Payer: Medicare Other | Admitting: Hematology

## 2023-07-14 ENCOUNTER — Inpatient Hospital Stay: Payer: Medicare Other | Attending: Hematology

## 2023-07-14 VITALS — BP 124/55 | HR 66 | Temp 96.9°F | Resp 17

## 2023-07-14 DIAGNOSIS — Z5111 Encounter for antineoplastic chemotherapy: Secondary | ICD-10-CM | POA: Insufficient documentation

## 2023-07-14 DIAGNOSIS — C7951 Secondary malignant neoplasm of bone: Secondary | ICD-10-CM | POA: Diagnosis present

## 2023-07-14 DIAGNOSIS — C50911 Malignant neoplasm of unspecified site of right female breast: Secondary | ICD-10-CM

## 2023-07-14 DIAGNOSIS — Z170 Estrogen receptor positive status [ER+]: Secondary | ICD-10-CM

## 2023-07-14 LAB — CBC WITH DIFFERENTIAL/PLATELET
Abs Immature Granulocytes: 0.02 10*3/uL (ref 0.00–0.07)
Basophils Absolute: 0 10*3/uL (ref 0.0–0.1)
Basophils Relative: 1 %
Eosinophils Absolute: 0.2 10*3/uL (ref 0.0–0.5)
Eosinophils Relative: 4 %
HCT: 33.6 % — ABNORMAL LOW (ref 36.0–46.0)
Hemoglobin: 10.8 g/dL — ABNORMAL LOW (ref 12.0–15.0)
Immature Granulocytes: 0 %
Lymphocytes Relative: 20 %
Lymphs Abs: 1 10*3/uL (ref 0.7–4.0)
MCH: 33.2 pg (ref 26.0–34.0)
MCHC: 32.1 g/dL (ref 30.0–36.0)
MCV: 103.4 fL — ABNORMAL HIGH (ref 80.0–100.0)
Monocytes Absolute: 0.3 10*3/uL (ref 0.1–1.0)
Monocytes Relative: 6 %
Neutro Abs: 3.6 10*3/uL (ref 1.7–7.7)
Neutrophils Relative %: 69 %
Platelets: 160 10*3/uL (ref 150–400)
RBC: 3.25 MIL/uL — ABNORMAL LOW (ref 3.87–5.11)
RDW: 13.2 % (ref 11.5–15.5)
WBC: 5.2 10*3/uL (ref 4.0–10.5)
nRBC: 0 % (ref 0.0–0.2)

## 2023-07-14 LAB — COMPREHENSIVE METABOLIC PANEL
ALT: 15 U/L (ref 0–44)
AST: 19 U/L (ref 15–41)
Albumin: 2.9 g/dL — ABNORMAL LOW (ref 3.5–5.0)
Alkaline Phosphatase: 46 U/L (ref 38–126)
Anion gap: 6 (ref 5–15)
BUN: 16 mg/dL (ref 8–23)
CO2: 31 mmol/L (ref 22–32)
Calcium: 8.6 mg/dL — ABNORMAL LOW (ref 8.9–10.3)
Chloride: 102 mmol/L (ref 98–111)
Creatinine, Ser: 0.56 mg/dL (ref 0.44–1.00)
GFR, Estimated: >60 mL/min (ref >60)
Glucose, Bld: 110 mg/dL — ABNORMAL HIGH (ref 70–99)
Potassium: 4.2 mmol/L (ref 3.5–5.1)
Sodium: 139 mmol/L (ref 135–145)
Total Bilirubin: 0.6 mg/dL (ref 0.3–1.2)
Total Protein: 5.5 g/dL — ABNORMAL LOW (ref 6.5–8.1)

## 2023-07-14 MED ORDER — DENOSUMAB 120 MG/1.7ML ~~LOC~~ SOLN
120.0000 mg | Freq: Once | SUBCUTANEOUS | Status: AC
  Administered 2023-07-14: 120 mg via SUBCUTANEOUS
  Filled 2023-07-14: qty 1.7

## 2023-07-14 MED ORDER — FULVESTRANT 250 MG/5ML IM SOSY
500.0000 mg | PREFILLED_SYRINGE | Freq: Once | INTRAMUSCULAR | Status: AC
  Administered 2023-07-14: 500 mg via INTRAMUSCULAR
  Filled 2023-07-14: qty 10

## 2023-07-14 NOTE — Patient Instructions (Signed)
[facility]  Discharge Instructions  You were seen and examined today by Dr. Ellin Saba.  Dr. Ellin Saba discussed your most recent lab work and CT scan which revealed that everything looks good and stable.  Follow-up as scheduled in 4 months.    Thank you for choosing [facility] to provide your oncology and hematology care.   To afford each patient quality time with our provider, please arrive at least 15 minutes before your scheduled appointment time. You may need to reschedule your appointment if you arrive late (10 or more minutes). Arriving late affects you and other patients whose appointments are after yours.  Also, if you miss three or more appointments without notifying the office, you may be dismissed from the clinic at the provider's discretion.    Again, thank you for choosing [facility].  Our hope is that these requests will decrease the amount of time that you wait before being seen by our physicians.   If you have a lab appointment with the Cancer Center - please note that after April 8th, all labs will be drawn in the cancer center.  You do not have to check in or register with the main entrance as you have in the past but will complete your check-in at the cancer center.            _____________________________________________________________  Should you have questions after your visit to [facility], please contact our office at [phone] and follow the prompts.  Our office hours are 8:00 a.m. to 4:30 p.m. Monday - Thursday and 8:00 a.m. to 2:30 p.m. Friday.  Please note that voicemails left after 4:00 p.m. may not be returned until the following business day.  We are closed weekends and all major holidays.  You do have access to a nurse 24-7, just call the main number to the clinic [phone] and do not press any options, hold on the line and a nurse will answer the phone.    For  prescription refill requests, have your pharmacy contact our office and allow 72 hours.    Masks are no longer required in the cancer centers. If you would like for your care team to wear a mask while they are taking care of you, please let them know. You may have one support person who is at least 76 years old accompany you for your appointments.

## 2023-07-14 NOTE — Patient Instructions (Signed)

## 2023-07-14 NOTE — Progress Notes (Signed)
Patient tolerated Faslodex and Xgeva injections with no complaints voiced.  Site clean and dry with no bruising or swelling noted.  No complaints of pain.  Discharged via EMS with vital signs stable and no signs or symptoms of distress noted.

## 2023-07-20 ENCOUNTER — Encounter: Payer: Self-pay | Admitting: *Deleted

## 2023-07-20 NOTE — Progress Notes (Signed)
At patient's last visit, she had requested a thicker mattress for her bed, which she obtained from [facility] [employer] in [facility].  I spoke to multiple representatives, who could not find her in the system, despite the fact that she had billing statements from them for the equipment.  Dr Sherril Croon was the ordering physician.  I contacted their office this morning and spoke to his nurse Kennith Center, who agreed to facilitate getting her what she needs.  Patient made aware.

## 2023-07-21 ENCOUNTER — Telehealth: Payer: Medicare Other | Admitting: Cardiology

## 2023-07-24 ENCOUNTER — Telehealth: Payer: Medicare Other | Admitting: Cardiology

## 2023-08-05 ENCOUNTER — Other Ambulatory Visit: Payer: Self-pay | Admitting: Cardiology

## 2023-08-11 ENCOUNTER — Inpatient Hospital Stay: Payer: Medicare Other

## 2023-08-11 ENCOUNTER — Other Ambulatory Visit: Payer: Self-pay

## 2023-08-11 DIAGNOSIS — C50911 Malignant neoplasm of unspecified site of right female breast: Secondary | ICD-10-CM

## 2023-08-12 ENCOUNTER — Inpatient Hospital Stay: Payer: Medicare Other

## 2023-08-12 ENCOUNTER — Inpatient Hospital Stay: Payer: Medicare Other | Attending: Hematology

## 2023-08-12 VITALS — BP 111/42 | HR 62 | Temp 97.3°F | Resp 20

## 2023-08-12 DIAGNOSIS — C50911 Malignant neoplasm of unspecified site of right female breast: Secondary | ICD-10-CM

## 2023-08-12 DIAGNOSIS — Z5111 Encounter for antineoplastic chemotherapy: Secondary | ICD-10-CM | POA: Insufficient documentation

## 2023-08-12 DIAGNOSIS — C7951 Secondary malignant neoplasm of bone: Secondary | ICD-10-CM | POA: Insufficient documentation

## 2023-08-12 DIAGNOSIS — Z170 Estrogen receptor positive status [ER+]: Secondary | ICD-10-CM | POA: Diagnosis not present

## 2023-08-12 LAB — COMPREHENSIVE METABOLIC PANEL
ALT: 14 U/L (ref 0–44)
AST: 21 U/L (ref 15–41)
Albumin: 2.9 g/dL — ABNORMAL LOW (ref 3.5–5.0)
Alkaline Phosphatase: 50 U/L (ref 38–126)
Anion gap: 6 (ref 5–15)
BUN: 15 mg/dL (ref 8–23)
CO2: 34 mmol/L — ABNORMAL HIGH (ref 22–32)
Calcium: 8.8 mg/dL — ABNORMAL LOW (ref 8.9–10.3)
Chloride: 99 mmol/L (ref 98–111)
Creatinine, Ser: 0.6 mg/dL (ref 0.44–1.00)
GFR, Estimated: >60 mL/min (ref >60)
Glucose, Bld: 108 mg/dL — ABNORMAL HIGH (ref 70–99)
Potassium: 4.5 mmol/L (ref 3.5–5.1)
Sodium: 139 mmol/L (ref 135–145)
Total Bilirubin: 1.1 mg/dL (ref 0.3–1.2)
Total Protein: 5.7 g/dL — ABNORMAL LOW (ref 6.5–8.1)

## 2023-08-12 LAB — CBC WITH DIFFERENTIAL/PLATELET
Abs Immature Granulocytes: 0.02 10*3/uL (ref 0.00–0.07)
Basophils Absolute: 0 10*3/uL (ref 0.0–0.1)
Basophils Relative: 1 %
Eosinophils Absolute: 0.2 10*3/uL (ref 0.0–0.5)
Eosinophils Relative: 4 %
HCT: 33 % — ABNORMAL LOW (ref 36.0–46.0)
Hemoglobin: 10.5 g/dL — ABNORMAL LOW (ref 12.0–15.0)
Immature Granulocytes: 1 %
Lymphocytes Relative: 24 %
Lymphs Abs: 1 10*3/uL (ref 0.7–4.0)
MCH: 32.9 pg (ref 26.0–34.0)
MCHC: 31.8 g/dL (ref 30.0–36.0)
MCV: 103.4 fL — ABNORMAL HIGH (ref 80.0–100.0)
Monocytes Absolute: 0.4 10*3/uL (ref 0.1–1.0)
Monocytes Relative: 9 %
Neutro Abs: 2.6 10*3/uL (ref 1.7–7.7)
Neutrophils Relative %: 61 %
Platelets: 173 10*3/uL (ref 150–400)
RBC: 3.19 MIL/uL — ABNORMAL LOW (ref 3.87–5.11)
RDW: 13.6 % (ref 11.5–15.5)
WBC: 4.2 10*3/uL (ref 4.0–10.5)
nRBC: 0 % (ref 0.0–0.2)

## 2023-08-12 MED ORDER — FULVESTRANT 250 MG/5ML IM SOSY
500.0000 mg | PREFILLED_SYRINGE | Freq: Once | INTRAMUSCULAR | Status: AC
  Administered 2023-08-12: 500 mg via INTRAMUSCULAR
  Filled 2023-08-12: qty 10

## 2023-08-12 MED ORDER — DENOSUMAB 120 MG/1.7ML ~~LOC~~ SOLN
120.0000 mg | Freq: Once | SUBCUTANEOUS | Status: AC
  Administered 2023-08-12: 120 mg via SUBCUTANEOUS
  Filled 2023-08-12: qty 1.7

## 2023-08-12 NOTE — Patient Instructions (Signed)
[facility]  Discharge Instructions: Thank you for choosing [facility] to provide your oncology and hematology care.  If you have a lab appointment with the Cancer Center - please note that after April 8th, 2024, all labs will be drawn in the cancer center.  You do not have to check in or register with the main entrance as you have in the past but will complete your check-in in the cancer center.  Wear comfortable clothing and clothing appropriate for easy access to any Portacath or PICC line.   We strive to give you quality time with your provider. You may need to reschedule your appointment if you arrive late (15 or more minutes).  Arriving late affects you and other patients whose appointments are after yours.  Also, if you miss three or more appointments without notifying the office, you may be dismissed from the clinic at the provider's discretion.      For prescription refill requests, have your pharmacy contact our office and allow 72 hours for refills to be completed.    Today you received the following Xgeva and Faslodex.  Denosumab Injection (Oncology) What is this medication? DENOSUMAB (den oh SUE mab) prevents weakened bones caused by cancer. It may also be used to treat noncancerous bone tumors that cannot be removed by surgery. It can also be used to treat high calcium levels in the blood caused by cancer. It works by blocking a protein that causes bones to break down quickly. This slows down the release of calcium from bones, which lowers calcium levels in your blood. It also makes your bones stronger and less likely to break (fracture). This medicine may be used for other purposes; ask your health care provider or pharmacist if you have questions. COMMON BRAND NAME(S): XGEVA What should I tell my care team before I take this medication? They need to know if you have any of these conditions: Dental disease Having surgery or tooth  extraction Infection Kidney disease Low levels of calcium or vitamin D in the blood Malnutrition On hemodialysis Skin conditions or sensitivity Thyroid or parathyroid disease An unusual reaction to denosumab, other medications, foods, dyes, or preservatives Pregnant or trying to get pregnant Breast-feeding How should I use this medication? This medication is for injection under the skin. It is given by your care team in a hospital or clinic setting. A special MedGuide will be given to you before each treatment. Be sure to read this information carefully each time. Talk to your care team about the use of this medication in children. While it may be prescribed for children as young as 13 years for selected conditions, precautions do apply. Overdosage: If you think you have taken too much of this medicine contact a poison control center or emergency room at once. NOTE: This medicine is only for you. Do not share this medicine with others. What if I miss a dose? Keep appointments for follow-up doses. It is important not to miss your dose. Call your care team if you are unable to keep an appointment. What may interact with this medication? Do not take this medication with any of the following: Other medications containing denosumab This medication may also interact with the following: Medications that lower your chance of fighting infection Steroid medications, such as prednisone or cortisone This list may not describe all possible interactions. Give your health care provider a list of all the medicines, herbs, non-prescription drugs, or dietary supplements you use. Also tell  them if you smoke, drink alcohol, or use illegal drugs. Some items may interact with your medicine. What should I watch for while using this medication? Your condition will be monitored carefully while you are receiving this medication. You may need blood work while taking this medication. This medication may increase  your risk of getting an infection. Call your care team for advice if you get a fever, chills, sore throat, or other symptoms of a cold or flu. Do not treat yourself. Try to avoid being around people who are sick. You should make sure you get enough calcium and vitamin D while you are taking this medication, unless your care team tells you not to. Discuss the foods you eat and the vitamins you take with your care team. Some people who take this medication have severe bone, joint, or muscle pain. This medication may also increase your risk for jaw problems or a broken thigh bone. Tell your care team right away if you have severe pain in your jaw, bones, joints, or muscles. Tell your care team if you have any pain that does not go away or that gets worse. Talk to your care team if you may be pregnant. Serious birth defects can occur if you take this medication during pregnancy and for 5 months after the last dose. You will need a negative pregnancy test before starting this medication. Contraception is recommended while taking this medication and for 5 months after the last dose. Your care team can help you find the option that works for you. What side effects may I notice from receiving this medication? Side effects that you should report to your care team as soon as possible: Allergic reactions--skin rash, itching, hives, swelling of the face, lips, tongue, or throat Bone, joint, or muscle pain Low calcium level--muscle pain or cramps, confusion, tingling, or numbness in the hands or feet Osteonecrosis of the jaw--pain, swelling, or redness in the mouth, numbness of the jaw, poor healing after dental work, unusual discharge from the mouth, visible bones in the mouth Side effects that usually do not require medical attention (report to your care team if they continue or are bothersome): Cough Diarrhea Fatigue Headache Nausea This list may not describe all possible side effects. Call your doctor for  medical advice about side effects. You may report side effects to FDA at 1-800-FDA-1088. Where should I keep my medication? This medication is given in a hospital or clinic. It will not be stored at home. NOTE: This sheet is a summary. It may not cover all possible information. If you have questions about this medicine, talk to your doctor, pharmacist, or health care provider.  2024 Elsevier/Gold Standard (2022-04-09 00:00:00)   Fulvestrant Injection What is this medication? FULVESTRANT (ful VES trant) treats breast cancer. It works by blocking the hormone estrogen in breast tissue, which prevents breast cancer cells from spreading or growing. This medicine may be used for other purposes; ask your health care provider or pharmacist if you have questions. COMMON BRAND NAME(S): FASLODEX What should I tell my care team before I take this medication? They need to know if you have any of these conditions: Bleeding disorder Liver disease Low blood cell levels, such as low white cells, red cells, and platelets An unusual or allergic reaction to fulvestrant, other medications, foods, dyes, or preservatives Pregnant or trying to get pregnant Breast-feeding How should I use this medication? This medication is injected into a muscle. It is given by your care team in a  hospital or clinic setting. Talk to your care team about the use of this medication in children. Special care may be needed. Overdosage: If you think you have taken too much of this medicine contact a poison control center or emergency room at once. NOTE: This medicine is only for you. Do not share this medicine with others. What if I miss a dose? Keep appointments for follow-up doses. It is important not to miss your dose. Call your care team if you are unable to keep an appointment. What may interact with this medication? Certain medications that prevent or treat blood clots, such as warfarin, enoxaparin, dalteparin, apixaban,  dabigatran, rivaroxaban This list may not describe all possible interactions. Give your health care provider a list of all the medicines, herbs, non-prescription drugs, or dietary supplements you use. Also tell them if you smoke, drink alcohol, or use illegal drugs. Some items may interact with your medicine. What should I watch for while using this medication? Your condition will be monitored carefully while you are receiving this medication. You may need blood work while taking this medication. Talk to your care team if you may be pregnant. Serious birth defects can occur if you take this medication during pregnancy and for 1 year after the last dose. You will need a negative pregnancy test before starting this medication. Contraception is recommended while taking this medication and for 1 year after the last dose. Your care team can help you find the option that works for you. Do not breastfeed while taking this medication and for 1 year after the last dose. This medication may cause infertility. Talk to your care team if you are concerned about your fertility. What side effects may I notice from receiving this medication? Side effects that you should report to your care team as soon as possible: Allergic reactions or angioedema--skin rash, itching or hives, swelling of the face, eyes, lips, tongue, arms, or legs, trouble swallowing or breathing Pain, tingling, or numbness in the hands or feet Side effects that usually do not require medical attention (report to your care team if they continue or are bothersome): Bone, joint, or muscle pain Constipation Headache Hot flashes Nausea Pain, redness, or irritation at injection site Unusual weakness or fatigue This list may not describe all possible side effects. Call your doctor for medical advice about side effects. You may report side effects to FDA at 1-800-FDA-1088. Where should I keep my medication? This medication is given in a hospital or  clinic. It will not be stored at home. NOTE: This sheet is a summary. It may not cover all possible information. If you have questions about this medicine, talk to your doctor, pharmacist, or health care provider.  2024 Elsevier/Gold Standard (2022-04-01 00:00:00)     To help prevent nausea and vomiting after your treatment, we encourage you to take your nausea medication as directed.  BELOW ARE SYMPTOMS THAT SHOULD BE REPORTED IMMEDIATELY: *FEVER GREATER THAN 100.4 F (38 C) OR HIGHER *CHILLS OR SWEATING *NAUSEA AND VOMITING THAT IS NOT CONTROLLED WITH YOUR NAUSEA MEDICATION *UNUSUAL SHORTNESS OF BREATH *UNUSUAL BRUISING OR BLEEDING *URINARY PROBLEMS (pain or burning when urinating, or frequent urination) *BOWEL PROBLEMS (unusual diarrhea, constipation, pain near the anus) TENDERNESS IN MOUTH AND THROAT WITH OR WITHOUT PRESENCE OF ULCERS (sore throat, sores in mouth, or a toothache) UNUSUAL RASH, SWELLING OR PAIN  UNUSUAL VAGINAL DISCHARGE OR ITCHING   Items with * indicate a potential emergency and should be followed up as soon as possible  or go to the Emergency Department if any problems should occur.  Please show the CHEMOTHERAPY ALERT CARD or IMMUNOTHERAPY ALERT CARD at check-in to the Emergency Department and triage nurse.  Should you have questions after your visit or need to cancel or reschedule your appointment, please contact [facility] [phone]  and follow the prompts.  Office hours are 8:00 a.m. to 4:30 p.m. Monday - Friday. Please note that voicemails left after 4:00 p.m. may not be returned until the following business day.  We are closed weekends and major holidays. You have access to a nurse at all times for urgent questions. Please call the main number to the clinic [phone] and follow the prompts.  For any non-urgent questions, you may also contact your provider using MyChart. We now offer e-Visits for anyone 51 and older to request care  online for non-urgent symptoms. For details visit [email or web address].   Also download the MyChart app! Go to the app store, search "MyChart", open the app, select , and log in with your MyChart username and password.

## 2023-08-12 NOTE — Progress Notes (Signed)
Patient presents today for Xgeva and Faslodex.Patient reports taking calcium and vitamin D daily. Denies any jaw pain or recent dental work. Calcium today is 8.8.  Patient tolerated injections with no complaints voiced. Injection sites clean and dry with no bruising or swelling noted.  No complaints of pain.  Discharged with vital signs stable and no signs or symptoms of distress noted.

## 2023-08-18 ENCOUNTER — Encounter (HOSPITAL_COMMUNITY): Payer: Self-pay | Admitting: Hematology

## 2023-08-25 ENCOUNTER — Other Ambulatory Visit: Payer: Self-pay | Admitting: *Deleted

## 2023-08-25 MED ORDER — CLOPIDOGREL BISULFATE 75 MG PO TABS: 75.0000 mg | ORAL_TABLET | Freq: Every day | ORAL | 2 refills | Status: DC

## 2023-08-28 ENCOUNTER — Emergency Department (HOSPITAL_COMMUNITY): Payer: Medicare Other

## 2023-08-28 ENCOUNTER — Other Ambulatory Visit: Payer: Self-pay

## 2023-08-28 ENCOUNTER — Encounter (HOSPITAL_COMMUNITY): Payer: Self-pay | Admitting: Emergency Medicine

## 2023-08-28 ENCOUNTER — Emergency Department (HOSPITAL_COMMUNITY)
Admission: EM | Admit: 2023-08-28 | Discharge: 2023-08-29 | Disposition: A | Discharge status: Home / Self Care | Payer: Medicare Other | Attending: Emergency Medicine | Admitting: Emergency Medicine

## 2023-08-28 DIAGNOSIS — Z7901 Long term (current) use of anticoagulants: Secondary | ICD-10-CM | POA: Diagnosis not present

## 2023-08-28 DIAGNOSIS — I110 Hypertensive heart disease with heart failure: Secondary | ICD-10-CM | POA: Diagnosis not present

## 2023-08-28 DIAGNOSIS — C7951 Secondary malignant neoplasm of bone: Secondary | ICD-10-CM | POA: Diagnosis not present

## 2023-08-28 DIAGNOSIS — I509 Heart failure, unspecified: Secondary | ICD-10-CM | POA: Diagnosis not present

## 2023-08-28 DIAGNOSIS — Z79899 Other long term (current) drug therapy: Secondary | ICD-10-CM | POA: Diagnosis not present

## 2023-08-28 DIAGNOSIS — R1033 Periumbilical pain: Secondary | ICD-10-CM | POA: Diagnosis present

## 2023-08-28 DIAGNOSIS — J449 Chronic obstructive pulmonary disease, unspecified: Secondary | ICD-10-CM | POA: Insufficient documentation

## 2023-08-28 DIAGNOSIS — Z853 Personal history of malignant neoplasm of breast: Secondary | ICD-10-CM | POA: Insufficient documentation

## 2023-08-28 DIAGNOSIS — R1084 Generalized abdominal pain: Secondary | ICD-10-CM

## 2023-08-28 LAB — URINALYSIS, ROUTINE W REFLEX MICROSCOPIC
Bilirubin Urine: NEGATIVE
Glucose, UA: NEGATIVE mg/dL
Hgb urine dipstick: NEGATIVE
Ketones, ur: NEGATIVE mg/dL
Leukocytes,Ua: NEGATIVE
Nitrite: NEGATIVE
Protein, ur: NEGATIVE mg/dL
Specific Gravity, Urine: 1.009 (ref 1.005–1.030)
pH: 8 (ref 5.0–8.0)

## 2023-08-28 LAB — CBC
HCT: 36.5 % (ref 36.0–46.0)
Hemoglobin: 12.2 g/dL (ref 12.0–15.0)
MCH: 33.6 pg (ref 26.0–34.0)
MCHC: 33.4 g/dL (ref 30.0–36.0)
MCV: 100.6 fL — ABNORMAL HIGH (ref 80.0–100.0)
Platelets: 177 10*3/uL (ref 150–400)
RBC: 3.63 MIL/uL — ABNORMAL LOW (ref 3.87–5.11)
RDW: 13.5 % (ref 11.5–15.5)
WBC: 4.9 10*3/uL (ref 4.0–10.5)
nRBC: 0 % (ref 0.0–0.2)

## 2023-08-28 LAB — COMPREHENSIVE METABOLIC PANEL
ALT: 35 U/L (ref 0–44)
AST: 91 U/L — ABNORMAL HIGH (ref 15–41)
Albumin: 3.4 g/dL — ABNORMAL LOW (ref 3.5–5.0)
Alkaline Phosphatase: 83 U/L (ref 38–126)
Anion gap: 9 (ref 5–15)
BUN: 16 mg/dL (ref 8–23)
CO2: 29 mmol/L (ref 22–32)
Calcium: 9.2 mg/dL (ref 8.9–10.3)
Chloride: 101 mmol/L (ref 98–111)
Creatinine, Ser: 0.58 mg/dL (ref 0.44–1.00)
GFR, Estimated: >60 mL/min (ref >60)
Glucose, Bld: 126 mg/dL — ABNORMAL HIGH (ref 70–99)
Potassium: 4.4 mmol/L (ref 3.5–5.1)
Sodium: 139 mmol/L (ref 135–145)
Total Bilirubin: 2 mg/dL — ABNORMAL HIGH (ref 0.3–1.2)
Total Protein: 6.1 g/dL — ABNORMAL LOW (ref 6.5–8.1)

## 2023-08-28 LAB — LIPASE, BLOOD: Lipase: 17 U/L (ref 11–51)

## 2023-08-28 MED ORDER — IOHEXOL 300 MG/ML  SOLN
100.0000 mL | Freq: Once | INTRAMUSCULAR | Status: AC | PRN
  Administered 2023-08-28: 100 mL via INTRAVENOUS

## 2023-08-28 NOTE — ED Notes (Signed)
Pt assisted to bedpan  Urine collected and sent to lab

## 2023-08-28 NOTE — ED Provider Notes (Signed)
**Stacey [name]** Stacey Stacey [name]   CSN: [id number] Arrival date & time: 08/28/23  1826     History {Add pertinent medical, surgical, social history, OB history to HPI:1} Chief Complaint  Patient presents with   Abdominal Pain    Stacey Stacey [name] is a 77 y.o. female.  HPI     Home Medications Prior to Admission medications   Medication Sig Start Date End Date Taking? Authorizing Provider  acetaminophen (TYLENOL) 325 MG tablet Take 2 tablets (650 mg total) by mouth every 6 (six) hours as needed for mild pain, fever or headache. 04/05/21   Shon Hale, MD  amLODipine (NORVASC) 5 MG tablet TAKE 1 TABLET EVERY DAY 05/12/23   Branch, Dorothe Pea, MD  apixaban (ELIQUIS) 5 MG TABS tablet Take 5 mg by mouth 2 (two) times daily. 04/01/23   [provider]  atorvastatin (LIPITOR) 80 MG tablet TAKE 1 TABLET EVERY MORNING 08/05/23   Antoine Poche, MD  b complex vitamins tablet Take 1 tablet by mouth every morning. 07/05/20   Albertine Grates, MD  carboxymethylcellulose (REFRESH PLUS) 0.5 % SOLN Place 1 drop into both eyes as needed.    [provider]  clopidogrel (PLAVIX) 75 MG tablet Take 1 tablet (75 mg total) by mouth daily. 08/25/23   Antoine Poche, MD  DULoxetine (CYMBALTA) 30 MG capsule Take 3 capsules (90 mg total) by mouth daily. Patient taking differently: Take 30 mg by mouth 2 (two) times daily. 07/06/20   Albertine Grates, MD  ferrous sulfate 325 (65 FE) MG tablet Take 325 mg by mouth daily.    [provider]  HYDROmorphone (DILAUDID) 2 MG tablet Take 1 tablet (2 mg total) by mouth every 6 (six) hours as needed for severe pain (breakthrough pain). 02/26/23   Arthor Captain, PA-C  losartan (COZAAR) 100 MG tablet TAKE 1 TABLET EVERY DAY 06/16/23   Antoine Poche, MD  methadone (DOLOPHINE) 5 MG tablet Take 5 mg by mouth every 8 (eight) hours. 09/03/23   [provider]  methocarbamol (ROBAXIN) 500 MG tablet Take 500 mg  by mouth every 8 (eight) hours as needed for muscle spasms. 08/11/23 09/10/23  [provider]  metoprolol succinate (TOPROL-XL) 100 MG 24 hr tablet TAKE 1 AND 1/2 TABLETS EVERY DAY. DOSE INCREASE. 07/01/23   Antoine Poche, MD  Multiple Vitamin (MULTIVITAMIN ADULT PO) Take 1 tablet by mouth daily.    [provider]  Multiple Vitamins-Minerals (EQ VISION FORMULA 50+ PO) Take 1 tablet by mouth every morning.    [provider]  naloxone [facility]) nasal spray 4 mg/0.1 mL Place into the nose. 02/27/23   [provider]  nitroGLYCERIN (NITROSTAT) 0.4 MG SL tablet Place 1 tablet (0.4 mg total) under the tongue every 5 (five) minutes x 3 doses as needed for chest pain (if no relief after 3rd dose, proceed to the ED or call 911). 05/05/23   Antoine Poche, MD  oxyCODONE (OXY IR/ROXICODONE) 5 MG immediate release tablet Take by mouth. 01/07/23   [provider]  Oxycodone HCl 10 MG TABS Take 10 mg by mouth 3 (three) times daily as needed. 08/26/22   [provider]  PAIN MANAGEMENT INTRATHECAL, IT, PUMP by Intrathecal Stacey [name]. 02/21/21   [provider]  pregabalin (LYRICA) 50 MG capsule Take 50 mg by mouth daily as needed (pain). 01/26/21   [provider]      Allergies    Brilinta [ticagrelor], Budesonide,  and Albuterol    Review of Systems   Review of Systems  Physical Exam Updated Vital Signs BP (!) 151/82 (BP Location: Left Wrist)   Pulse 78   Temp 98 F (36.7 C) (Axillary)   Resp 15   Ht 1.676 m (5\' 6" )   Wt 86.6 kg   SpO2 98%   BMI 30.83 kg/m  Physical Exam  ED Results / Procedures / Treatments   Labs (all labs ordered are listed, but only abnormal results are displayed) Labs Reviewed  COMPREHENSIVE METABOLIC PANEL - Abnormal; Notable for the following components:      Result Value   Glucose, Bld 126 (*)    Total Protein 6.1 (*)    Albumin 3.4 (*)    AST 91 (*)    Total Bilirubin 2.0 (*)    All other  components within normal limits  CBC - Abnormal; Notable for the following components:   RBC 3.63 (*)    MCV 100.6 (*)    All other components within normal limits  LIPASE, BLOOD  URINALYSIS, ROUTINE W REFLEX MICROSCOPIC    EKG None  Radiology No results found.  Procedures Procedures  {Document cardiac monitor, telemetry assessment procedure when appropriate:1}  Medications Ordered in ED Medications - No data to display  ED Course/ Medical Decision Making/ A&P   {   Click here for ABCD2, HEART and other calculatorsREFRESH Stacey [name] before signing :1}                              Medical Decision Making Amount and/or Complexity of Data Reviewed Labs: ordered.   ***  {Document critical care time when appropriate:1} {Document review of labs and clinical decision tools ie heart score, Chads2Vasc2 etc:1}  {Document your independent review of radiology images, and any outside records:1} {Document your discussion with family members, caretakers, and with consultants:1} {Document social determinants of health affecting pt's care:1} {Document your decision making why or why not admission, treatments were needed:1} Final Clinical Impression(s) / ED Diagnoses Final diagnoses:  None    Rx / DC Orders ED Discharge Orders     None

## 2023-08-28 NOTE — ED Triage Notes (Signed)
Pt BIB [facility] for new onset abd pain, hx of abd hernia with mesh placement, hx of breast cancer, is followed by pain management with an indwelling pain pump, EKG with EMS shows BBB, hx of 2 MI's and A-fib; v/s upon EMS arrival 206/116, HR 73, O2 98% RA, son gave pt 1 hydrocodone 36m PTA of EMS

## 2023-08-29 MED ORDER — METOPROLOL SUCCINATE ER 50 MG PO TB24: 100.0000 mg | ORAL_TABLET | Freq: Every day | ORAL | Status: DC

## 2023-08-29 MED ORDER — LOSARTAN POTASSIUM 25 MG PO TABS: 100.0000 mg | ORAL_TABLET | Freq: Every day | ORAL | Status: DC

## 2023-08-29 MED ORDER — APIXABAN 5 MG PO TABS: 5.0000 mg | ORAL_TABLET | Freq: Two times a day (BID) | ORAL | Status: DC

## 2023-08-29 MED ORDER — OXYCODONE HCL 5 MG PO TABS: 5.0000 mg | ORAL_TABLET | ORAL | Status: DC | PRN

## 2023-08-29 MED ORDER — AMLODIPINE BESYLATE 5 MG PO TABS: 5.0000 mg | ORAL_TABLET | Freq: Every day | ORAL | Status: DC

## 2023-08-29 MED ORDER — ATORVASTATIN CALCIUM 40 MG PO TABS: 80.0000 mg | ORAL_TABLET | Freq: Every morning | ORAL | Status: DC

## 2023-08-29 MED ORDER — DULOXETINE HCL 30 MG PO CPEP: 30.0000 mg | ORAL_CAPSULE | Freq: Two times a day (BID) | ORAL | Status: DC

## 2023-08-29 NOTE — Discharge Instructions (Signed)
Follow back up with [facility].  Return for any new or worse symptoms.  Today's workup without any acute findings which is very reassuring.  Continue your pain medication.

## 2023-08-29 NOTE — ED Notes (Signed)
[facility] here for transport. 

## 2023-08-29 NOTE — ED Notes (Addendum)
Pt alert, NAD, calm, interactive, resps e/u, speech clear. D/c'd and pending transport back to [facility]. Repositioned for comfort. Anxious and doesn't feel well, endorses abd pain and nausea, no changes.

## 2023-09-07 ENCOUNTER — Other Ambulatory Visit: Payer: Self-pay

## 2023-09-07 DIAGNOSIS — C50911 Malignant neoplasm of unspecified site of right female breast: Secondary | ICD-10-CM

## 2023-09-08 ENCOUNTER — Inpatient Hospital Stay: Payer: Medicare Other | Attending: Hematology

## 2023-09-08 ENCOUNTER — Inpatient Hospital Stay: Payer: Medicare Other

## 2023-09-08 VITALS — BP 125/56 | HR 78 | Temp 97.7°F | Resp 18

## 2023-09-08 DIAGNOSIS — Z5111 Encounter for antineoplastic chemotherapy: Secondary | ICD-10-CM | POA: Insufficient documentation

## 2023-09-08 DIAGNOSIS — C7951 Secondary malignant neoplasm of bone: Secondary | ICD-10-CM | POA: Diagnosis present

## 2023-09-08 DIAGNOSIS — C50911 Malignant neoplasm of unspecified site of right female breast: Secondary | ICD-10-CM | POA: Insufficient documentation

## 2023-09-08 LAB — COMPREHENSIVE METABOLIC PANEL
ALT: 75 U/L — ABNORMAL HIGH (ref 0–44)
AST: 67 U/L — ABNORMAL HIGH (ref 15–41)
Albumin: 2.7 g/dL — ABNORMAL LOW (ref 3.5–5.0)
Alkaline Phosphatase: 128 U/L — ABNORMAL HIGH (ref 38–126)
Anion gap: 7 (ref 5–15)
BUN: 18 mg/dL (ref 8–23)
CO2: 32 mmol/L (ref 22–32)
Calcium: 8.8 mg/dL — ABNORMAL LOW (ref 8.9–10.3)
Chloride: 99 mmol/L (ref 98–111)
Creatinine, Ser: 0.62 mg/dL (ref 0.44–1.00)
GFR, Estimated: >60 mL/min (ref >60)
Glucose, Bld: 133 mg/dL — ABNORMAL HIGH (ref 70–99)
Potassium: 4.9 mmol/L (ref 3.5–5.1)
Sodium: 138 mmol/L (ref 135–145)
Total Bilirubin: 0.8 mg/dL (ref 0.3–1.2)
Total Protein: 5.9 g/dL — ABNORMAL LOW (ref 6.5–8.1)

## 2023-09-08 LAB — CBC WITH DIFFERENTIAL/PLATELET
Abs Immature Granulocytes: 0.05 10*3/uL (ref 0.00–0.07)
Basophils Absolute: 0 10*3/uL (ref 0.0–0.1)
Basophils Relative: 0 %
Eosinophils Absolute: 0.2 10*3/uL (ref 0.0–0.5)
Eosinophils Relative: 2 %
HCT: 32.7 % — ABNORMAL LOW (ref 36.0–46.0)
Hemoglobin: 10.4 g/dL — ABNORMAL LOW (ref 12.0–15.0)
Immature Granulocytes: 1 %
Lymphocytes Relative: 13 %
Lymphs Abs: 1 10*3/uL (ref 0.7–4.0)
MCH: 33 pg (ref 26.0–34.0)
MCHC: 31.8 g/dL (ref 30.0–36.0)
MCV: 103.8 fL — ABNORMAL HIGH (ref 80.0–100.0)
Monocytes Absolute: 0.5 10*3/uL (ref 0.1–1.0)
Monocytes Relative: 7 %
Neutro Abs: 6 10*3/uL (ref 1.7–7.7)
Neutrophils Relative %: 77 %
Platelets: 202 10*3/uL (ref 150–400)
RBC: 3.15 MIL/uL — ABNORMAL LOW (ref 3.87–5.11)
RDW: 13.4 % (ref 11.5–15.5)
WBC: 7.8 10*3/uL (ref 4.0–10.5)
nRBC: 0 % (ref 0.0–0.2)

## 2023-09-08 MED ORDER — FULVESTRANT 250 MG/5ML IM SOSY
500.0000 mg | PREFILLED_SYRINGE | Freq: Once | INTRAMUSCULAR | Status: AC
  Administered 2023-09-08: 500 mg via INTRAMUSCULAR
  Filled 2023-09-08: qty 10

## 2023-09-08 MED ORDER — DENOSUMAB 120 MG/1.7ML ~~LOC~~ SOLN
120.0000 mg | Freq: Once | SUBCUTANEOUS | Status: AC
  Administered 2023-09-08: 120 mg via SUBCUTANEOUS
  Filled 2023-09-08: qty 1.7

## 2023-09-08 NOTE — Patient Instructions (Signed)
[facility]  Discharge Instructions: Thank you for choosing [facility] to provide your oncology and hematology care.  If you have a lab appointment with the Cancer Center - please note that after April 8th, 2024, all labs will be drawn in the cancer center.  You do not have to check in or register with the main entrance as you have in the past but will complete your check-in in the cancer center.  Wear comfortable clothing and clothing appropriate for easy access to any Portacath or PICC line.   We strive to give you quality time with your provider. You may need to reschedule your appointment if you arrive late (15 or more minutes).  Arriving late affects you and other patients whose appointments are after yours.  Also, if you miss three or more appointments without notifying the office, you may be dismissed from the clinic at the provider's discretion.      For prescription refill requests, have your pharmacy contact our office and allow 72 hours for refills to be completed.    IATELY: *FEVER GREATER THAN 100.4 F (38 C) OR HIGHER *CHILLS OR SWEATING *NAUSEA AND VOMITING THAT IS NOT CONTROLLED WITH YOUR NAUSEA MEDICATION *UNUSUAL SHORTNESS OF BREATH *UNUSUAL BRUISING OR BLEEDING *URINARY PROBLEMS (pain or burning when urinating, or frequent urination) *BOWEL PROBLEMS (unusual diarrhea, constipation, pain near the anus) TENDERNESS IN MOUTH AND THROAT WITH OR WITHOUT PRESENCE OF ULCERS (sore throat, sores in mouth, or a toothache) UNUSUAL RASH, SWELLING OR PAIN  UNUSUAL VAGINAL DISCHARGE OR ITCHING   Items with * indicate a potential emergency and should be followed up as soon as possible or go to the Emergency Department if any problems should occur.  Please show the CHEMOTHERAPY ALERT CARD or IMMUNOTHERAPY ALERT CARD at check-in to the Emergency Department and triage nurse.  Should you have questions after your visit or need to cancel or reschedule your  appointment, please contact [facility] AT [facility] [phone]  and follow the prompts.  Office hours are 8:00 a.m. to 4:30 p.m. Monday - Friday. Please note that voicemails left after 4:00 p.m. may not be returned until the following business day.  We are closed weekends and major holidays. You have access to a nurse at all times for urgent questions. Please call the main number to the clinic [phone] and follow the prompts.  For any non-urgent questions, you may also contact your provider using MyChart. We now offer e-Visits for anyone 83 and older to request care online for non-urgent symptoms. For details visit [email or web address].   Also download the MyChart app! Go to the app store, search "MyChart", open the app, select [place], and log in with your MyChart username and password.  [facility]  Discharge Instructions: Thank you for choosing [facility] to provide your oncology and hematology care.  If you have a lab appointment with the Cancer Center - please note that after April 8th, 2024, all labs will be drawn in the cancer center.  You do not have to check in or register with the main entrance as you have in the past but will complete your check-in in the cancer center.  Wear comfortable clothing and clothing appropriate for easy access to any Portacath or PICC line.   We strive to give you quality time with your provider. You may need to reschedule your appointment if you arrive late (15 or more minutes).  Arriving late affects you  and other patients whose appointments are after yours.  Also, if you miss three or more appointments without notifying the office, you may be dismissed from the clinic at the provider's discretion.      For prescription refill requests, have your pharmacy contact our office and allow 72 hours for refills to be completed.    Today you received Faslodex and Xgeva   To help prevent nausea and vomiting  after your treatment, we encourage you to take your nausea medication as directed.  BELOW ARE SYMPTOMS THAT SHOULD BE REPORTED IMMEDIATELY: *FEVER GREATER THAN 100.4 F (38 C) OR HIGHER *CHILLS OR SWEATING *NAUSEA AND VOMITING THAT IS NOT CONTROLLED WITH YOUR NAUSEA MEDICATION *UNUSUAL SHORTNESS OF BREATH *UNUSUAL BRUISING OR BLEEDING *URINARY PROBLEMS (pain or burning when urinating, or frequent urination) *BOWEL PROBLEMS (unusual diarrhea, constipation, pain near the anus) TENDERNESS IN MOUTH AND THROAT WITH OR WITHOUT PRESENCE OF ULCERS (sore throat, sores in mouth, or a toothache) UNUSUAL RASH, SWELLING OR PAIN  UNUSUAL VAGINAL DISCHARGE OR ITCHING   Items with * indicate a potential emergency and should be followed up as soon as possible or go to the Emergency Department if any problems should occur.  Please show the CHEMOTHERAPY ALERT CARD or IMMUNOTHERAPY ALERT CARD at check-in to the Emergency Department and triage nurse.  Should you have questions after your visit or need to cancel or reschedule your appointment, please contact [facility] CENTER AT [facility] [phone]  and follow the prompts.  Office hours are 8:00 a.m. to 4:30 p.m. Monday - Friday. Please note that voicemails left after 4:00 p.m. may not be returned until the following business day.  We are closed weekends and major holidays. You have access to a nurse at all times for urgent questions. Please call the main number to the clinic [phone] and follow the prompts.  For any non-urgent questions, you may also contact your provider using MyChart. We now offer e-Visits for anyone 2 and older to request care online for non-urgent symptoms. For details visit [email or web address].   Also download the MyChart app! Go to the app store, search "MyChart", open the app, select [place], and log in with your MyChart username and password.

## 2023-09-08 NOTE — Progress Notes (Signed)
Stacey [name] presents today for injection per the provider's orders. Faslodex and Xgeva administration without incident; injection site WNL; see MAR for injection details.  Patient tolerated procedure well and without incident.  No questions or complaints noted at this time.   Discharged from clinic via stretcher by EMS in stable condition. Alert and oriented x 3. F/U with [facility] as scheduled.

## 2023-10-05 ENCOUNTER — Other Ambulatory Visit: Payer: Self-pay

## 2023-10-05 DIAGNOSIS — C50911 Malignant neoplasm of unspecified site of right female breast: Secondary | ICD-10-CM

## 2023-10-05 NOTE — Progress Notes (Signed)
Lab orders entered

## 2023-10-06 ENCOUNTER — Inpatient Hospital Stay: Payer: Medicare Other | Attending: Hematology

## 2023-10-06 ENCOUNTER — Inpatient Hospital Stay: Payer: Medicare Other

## 2023-10-06 VITALS — BP 117/43 | HR 69 | Temp 97.5°F | Resp 18

## 2023-10-06 DIAGNOSIS — C50911 Malignant neoplasm of unspecified site of right female breast: Secondary | ICD-10-CM | POA: Insufficient documentation

## 2023-10-06 DIAGNOSIS — C7951 Secondary malignant neoplasm of bone: Secondary | ICD-10-CM | POA: Diagnosis present

## 2023-10-06 DIAGNOSIS — Z5111 Encounter for antineoplastic chemotherapy: Secondary | ICD-10-CM | POA: Diagnosis present

## 2023-10-06 LAB — CBC WITH DIFFERENTIAL/PLATELET
Abs Immature Granulocytes: 0.04 10*3/uL (ref 0.00–0.07)
Basophils Absolute: 0 10*3/uL (ref 0.0–0.1)
Basophils Relative: 1 %
Eosinophils Absolute: 0.2 10*3/uL (ref 0.0–0.5)
Eosinophils Relative: 4 %
HCT: 32.1 % — ABNORMAL LOW (ref 36.0–46.0)
Hemoglobin: 10.2 g/dL — ABNORMAL LOW (ref 12.0–15.0)
Immature Granulocytes: 1 %
Lymphocytes Relative: 22 %
Lymphs Abs: 1 10*3/uL (ref 0.7–4.0)
MCH: 32.9 pg (ref 26.0–34.0)
MCHC: 31.8 g/dL (ref 30.0–36.0)
MCV: 103.5 fL — ABNORMAL HIGH (ref 80.0–100.0)
Monocytes Absolute: 0.3 10*3/uL (ref 0.1–1.0)
Monocytes Relative: 7 %
Neutro Abs: 2.9 10*3/uL (ref 1.7–7.7)
Neutrophils Relative %: 65 %
Platelets: 227 10*3/uL (ref 150–400)
RBC: 3.1 MIL/uL — ABNORMAL LOW (ref 3.87–5.11)
RDW: 14.5 % (ref 11.5–15.5)
WBC: 4.4 10*3/uL (ref 4.0–10.5)
nRBC: 0 % (ref 0.0–0.2)

## 2023-10-06 LAB — COMPREHENSIVE METABOLIC PANEL
ALT: 73 U/L — ABNORMAL HIGH (ref 0–44)
AST: 66 U/L — ABNORMAL HIGH (ref 15–41)
Albumin: 2.7 g/dL — ABNORMAL LOW (ref 3.5–5.0)
Alkaline Phosphatase: 420 U/L — ABNORMAL HIGH (ref 38–126)
Anion gap: 7 (ref 5–15)
BUN: 14 mg/dL (ref 8–23)
CO2: 30 mmol/L (ref 22–32)
Calcium: 9.1 mg/dL (ref 8.9–10.3)
Chloride: 101 mmol/L (ref 98–111)
Creatinine, Ser: 0.6 mg/dL (ref 0.44–1.00)
GFR, Estimated: >60 mL/min (ref >60)
Glucose, Bld: 122 mg/dL — ABNORMAL HIGH (ref 70–99)
Potassium: 4.5 mmol/L (ref 3.5–5.1)
Sodium: 138 mmol/L (ref 135–145)
Total Bilirubin: 0.8 mg/dL (ref <1.2)
Total Protein: 6.1 g/dL — ABNORMAL LOW (ref 6.5–8.1)

## 2023-10-06 MED ORDER — FULVESTRANT 250 MG/5ML IM SOSY
500.0000 mg | PREFILLED_SYRINGE | Freq: Once | INTRAMUSCULAR | Status: AC
  Administered 2023-10-06: 500 mg via INTRAMUSCULAR
  Filled 2023-10-06: qty 10

## 2023-10-06 MED ORDER — DENOSUMAB 120 MG/1.7ML ~~LOC~~ SOLN
120.0000 mg | Freq: Once | SUBCUTANEOUS | Status: AC
Start: 2023-10-06 — End: 2023-10-06
  Administered 2023-10-06: 120 mg via SUBCUTANEOUS
  Filled 2023-10-06: qty 1.7

## 2023-10-06 NOTE — Progress Notes (Signed)
Xgeva and Faslodex injections given per orders. Patient tolerated it well without problems. Vitals stable and discharged home from clinic via [facility]. Follow up as scheduled.

## 2023-10-07 LAB — CANCER ANTIGEN 15-3: CA 15-3: 21.2 U/mL (ref 0.0–25.0)

## 2023-10-07 LAB — CANCER ANTIGEN 27.29: CA 27.29: 22.1 U/mL (ref 0.0–38.6)

## 2023-10-27 ENCOUNTER — Encounter (HOSPITAL_COMMUNITY): Payer: Self-pay | Admitting: Radiology

## 2023-10-27 ENCOUNTER — Ambulatory Visit (HOSPITAL_COMMUNITY)
Admission: RE | Admit: 2023-10-27 | Discharge: 2023-10-27 | Disposition: A | Discharge status: Home / Self Care | Payer: Medicare Other | Source: Ambulatory Visit | Attending: Hematology | Admitting: Hematology

## 2023-10-27 DIAGNOSIS — C50911 Malignant neoplasm of unspecified site of right female breast: Secondary | ICD-10-CM

## 2023-10-27 MED ORDER — IOHEXOL 300 MG/ML  SOLN: 100.0000 mL | Freq: Once | INTRAMUSCULAR | Status: DC | PRN

## 2023-11-01 ENCOUNTER — Other Ambulatory Visit: Payer: Self-pay | Admitting: Cardiology

## 2023-11-02 ENCOUNTER — Other Ambulatory Visit: Payer: Self-pay

## 2023-11-02 DIAGNOSIS — C50911 Malignant neoplasm of unspecified site of right female breast: Secondary | ICD-10-CM

## 2023-11-03 ENCOUNTER — Inpatient Hospital Stay: Payer: Medicare Other

## 2023-11-03 ENCOUNTER — Inpatient Hospital Stay: Payer: Medicare Other | Attending: Hematology

## 2023-11-03 ENCOUNTER — Inpatient Hospital Stay: Payer: Medicare Other | Admitting: Hematology

## 2023-11-03 VITALS — BP 120/54 | HR 96 | Temp 97.4°F | Resp 20

## 2023-11-03 DIAGNOSIS — C50911 Malignant neoplasm of unspecified site of right female breast: Secondary | ICD-10-CM | POA: Insufficient documentation

## 2023-11-03 DIAGNOSIS — Z5111 Encounter for antineoplastic chemotherapy: Secondary | ICD-10-CM | POA: Diagnosis present

## 2023-11-03 DIAGNOSIS — Z170 Estrogen receptor positive status [ER+]: Secondary | ICD-10-CM | POA: Diagnosis not present

## 2023-11-03 DIAGNOSIS — C7951 Secondary malignant neoplasm of bone: Secondary | ICD-10-CM | POA: Insufficient documentation

## 2023-11-03 LAB — COMPREHENSIVE METABOLIC PANEL
ALT: 28 U/L (ref 0–44)
AST: 26 U/L (ref 15–41)
Albumin: 2.9 g/dL — ABNORMAL LOW (ref 3.5–5.0)
Alkaline Phosphatase: 223 U/L — ABNORMAL HIGH (ref 38–126)
Anion gap: 6 (ref 5–15)
BUN: 16 mg/dL (ref 8–23)
CO2: 32 mmol/L (ref 22–32)
Calcium: 9.4 mg/dL (ref 8.9–10.3)
Chloride: 101 mmol/L (ref 98–111)
Creatinine, Ser: 0.57 mg/dL (ref 0.44–1.00)
GFR, Estimated: >60 mL/min (ref >60)
Glucose, Bld: 101 mg/dL — ABNORMAL HIGH (ref 70–99)
Potassium: 4.1 mmol/L (ref 3.5–5.1)
Sodium: 139 mmol/L (ref 135–145)
Total Bilirubin: 1 mg/dL (ref <1.2)
Total Protein: 6.2 g/dL — ABNORMAL LOW (ref 6.5–8.1)

## 2023-11-03 LAB — CBC WITH DIFFERENTIAL/PLATELET
Abs Immature Granulocytes: 0.02 10*3/uL (ref 0.00–0.07)
Basophils Absolute: 0 10*3/uL (ref 0.0–0.1)
Basophils Relative: 1 %
Eosinophils Absolute: 0.2 10*3/uL (ref 0.0–0.5)
Eosinophils Relative: 4 %
HCT: 35.2 % — ABNORMAL LOW (ref 36.0–46.0)
Hemoglobin: 11 g/dL — ABNORMAL LOW (ref 12.0–15.0)
Immature Granulocytes: 0 %
Lymphocytes Relative: 20 %
Lymphs Abs: 1.1 10*3/uL (ref 0.7–4.0)
MCH: 32.3 pg (ref 26.0–34.0)
MCHC: 31.3 g/dL (ref 30.0–36.0)
MCV: 103.2 fL — ABNORMAL HIGH (ref 80.0–100.0)
Monocytes Absolute: 0.3 10*3/uL (ref 0.1–1.0)
Monocytes Relative: 6 %
Neutro Abs: 3.7 10*3/uL (ref 1.7–7.7)
Neutrophils Relative %: 69 %
Platelets: 218 10*3/uL (ref 150–400)
RBC: 3.41 MIL/uL — ABNORMAL LOW (ref 3.87–5.11)
RDW: 14.5 % (ref 11.5–15.5)
WBC: 5.4 10*3/uL (ref 4.0–10.5)
nRBC: 0 % (ref 0.0–0.2)

## 2023-11-03 MED ORDER — FULVESTRANT 250 MG/5ML IM SOSY
500.0000 mg | PREFILLED_SYRINGE | Freq: Once | INTRAMUSCULAR | Status: AC
Start: 2023-11-03 — End: 2023-11-03
  Administered 2023-11-03: 500 mg via INTRAMUSCULAR
  Filled 2023-11-03: qty 10

## 2023-11-03 MED ORDER — DENOSUMAB 120 MG/1.7ML ~~LOC~~ SOLN
120.0000 mg | Freq: Once | SUBCUTANEOUS | Status: AC
  Administered 2023-11-03: 120 mg via SUBCUTANEOUS
  Filled 2023-11-03: qty 1.7

## 2023-11-03 NOTE — Progress Notes (Unsigned)
[name] B Rickerson presents today for injection per the provider's orders. Faslodex and Xgeva administration without incident; injection site WNL; see MAR for injection details.  Patient tolerated procedure well and without incident.  No questions or complaints noted at this time.   Discharged from clinic via stretcher by EMS in stable condition. Alert and oriented x 3. F/U with [facility] as scheduled.

## 2023-11-03 NOTE — Progress Notes (Incomplete)
[facility] [address] [place] [address]    Clinic Day:  11/03/23   Referring physician: Ignatius Specking, MD  Patient Care Team: Ignatius Specking, MD as PCP - General (Internal Medicine) Wyline Mood Dorothe Pea, MD as PCP - Cardiology (Cardiology) Therese Sarah, RN as Oncology Nurse Navigator (Oncology)   ASSESSMENT & PLAN:   Assessment: 1.  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 -Ultrasound-guided biopsy on 01/07/2016 showed grade 1 invasive ductal carcinoma, ER positive, PR positive, HER-2 equivocal by FISH, right axillary lymph node with metastatic adenocarcinoma.  HER-2 equivocal since her 2/CEP17 ratio less than 2 and average copy number of her 2 is greater than or equal to 4 but less than 6 signals per cell. -Bone biopsy on 05/30/2016 consistent with metastatic breast cancer. -Palliative therapy with letrozole started under the direction of Dr. Roland Earl at [facility] from 02/25/2016 through February 2020, progressive symptoms with increased soft tissue extension at L2. 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 -Addition of Ilda Foil was discussed but she was concerned about cost of the medication. -CT abdomen and pelvis on 08/13/2020 shows acute compression fracture deformity of the L1 vertebral body and other chronic findings. -Her last Faslodex injection at [facility] was on 11/06/2020. -CT CAP on 02/19/2021 with no evidence of for solid organ metastasis or nodal metastasis.  Right breast lesion is stable.  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 -Bone scan on 02/19/2021 was negative for bone mets.   2.  Social/family history: -She worked for [employer] prior to retirement in 2010. -She is  currently living with her son who is helping with her day-to-day activities.  Daughter lives in [place].  She quit smoking 35 to 40 years ago. -She is mostly confined to bed.  However she is able to change clothes, able to wash upper body, halfway dress herself.  She cannot stand up for the past 2 years because of weakness in the legs and knee arthritis.  She wears adult diapers.  Plan: 1.  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  Unchanged bone metastasis at L1 and L2.  Unchanged mass of the peripheral superior pole of the left kidney 2.7 x 2.7 cm. - Reviewed bone scan from 06/22/2023: Stable disease. - Brain MRI on 03/16/2023: No metastatic disease done at [facility]. - She reports no control of the right leg and very little control of the left leg. - Continue Faslodex monthly. - RTC 4 months for follow-up with repeat CT CAP and tumor markers.     2.  Cancer associated pain/RLQ pain: - She has morphine/bupivacaine intrathecal pump.  She reports that pain is poorly controlled.  Continue follow-up with [facility] pain management clinic.  She has an appointment with them.  3.  Left kidney upper pole mass: - Current CT scan shows stable mass measuring 2.7 x 2.7 cm.  4.  Bone metastasis: - Continue monthly denosumab.  5.  Pulmonary embolism (Dx 03/16/2023): - CT scan done at [facility] showed nonocclusive pulmonary embolism in the left lower lobe segmental artery. - Continue Eliquis twice daily.  No orders of the  defined types were placed in this encounter.     [name],acting as a Neurosurgeon for Stacey Massed, MD.,have documented all relevant documentation on the behalf of Stacey Massed, MD,as directed by  Stacey Massed, MD while in the presence of Stacey Massed, MD.  ***    Lissa Hoard R Teague   12/3/20247:50 AM  CHIEF COMPLAINT:   Diagnosis: metastatic right breast  cancer to the bones    Cancer Staging  Primary malignant neoplasm of breast with metastasis [facility]) Staging form: Breast, AJCC 8th Edition - Clinical stage from 02/07/2021: Stage IV (cT2, cN1, pM1, G1, ER+, PR+, HER2: Equivocal) - Signed by Stacey Massed, MD on 02/07/2021    Prior Therapy:  Palliative therapy with letrozole started under the direction of Dr. Roland Earl at [facility] from 02/25/2016 through February 2020   Current Therapy:  She was transitioned to fulvestrant in February 2020- still receiving monthly Faslodex   HISTORY OF PRESENT ILLNESS:   Oncology History  Primary malignant neoplasm of breast with metastasis (HCC)  11/23/2017 Initial Diagnosis   Metastatic breast cancer (HCC)   02/07/2021 Cancer Staging   Staging form: Breast, AJCC 8th Edition - Clinical stage from 02/07/2021: Stage IV (cT2, cN1, pM1, G1, ER+, PR+, HER2: Equivocal) - Signed by Stacey Massed, MD on 02/07/2021 Histopathologic type: Infiltrating duct carcinoma, NOS Stage prefix: Initial diagnosis Histologic grading system: 3 grade system   02/07/2021 - 02/07/2021 Chemotherapy            INTERVAL HISTORY:   Stacey [name] is a 77 y.o. female presenting to clinic today for follow up of metastatic right breast cancer to the bones. She was last seen by me on 07/14/23.  Since her last visit, she presented to the ED on 08/28/23 for generalized abdominal pain.  Today, she states that she is doing well overall. Her appetite level is at ***%. Her energy level is at ***%.   PAST MEDICAL HISTORY:   Past Medical History: Past Medical History:  Diagnosis Date   CAD in native artery    a. DES to ramus 2005 with late stent thrombosis 2006 tx with PTCA. 12/18 PCI/DES x1 to mRCA, EF 50-55%   CHF (congestive heart failure) (HCC)    Chronic pain    COPD (chronic obstructive pulmonary disease) (HCC)    Hyperlipidemia    Hypertension    Left bundle branch block    Lymphedema    Metastatic breast cancer     a. to bone.   MI (myocardial infarction) (HCC)    Mild aortic stenosis 10/2017   Morbid obesity (HCC)    PAF (paroxysmal atrial fibrillation) (HCC)    PSVT (paroxysmal supraventricular tachycardia) (HCC)    a. per [facility] notes, seen on event monitor in 2014.   Pulmonary nodules     Surgical History: Past Surgical History:  Procedure Laterality Date   CHOLECYSTECTOMY N/A 03/29/2021   Procedure: LAPAROSCOPIC CHOLECYSTECTOMY;  Surgeon: Lucretia Roers, MD;  Location: [facility];  Service: General;  Laterality: N/A;   CORONARY STENT INTERVENTION N/A 11/26/2017   Procedure: CORONARY STENT INTERVENTION;  Surgeon: Swaziland, Peter M, MD;  Location: [facility] INVASIVE CV LAB;  Service: Cardiovascular;  Laterality: N/A;   CORONARY STENT PLACEMENT     ERCP N/A 03/28/2021   Procedure: ENDOSCOPIC RETROGRADE CHOLANGIOPANCREATOGRAPHY (ERCP);  Surgeon: Malissa Hippo, MD;  Location: [facility];  Service: Endoscopy;  Laterality: N/A;   intrathecal pain pump     LEFT HEART CATH AND CORONARY ANGIOGRAPHY N/A 11/26/2017   Procedure:  LEFT HEART CATH AND CORONARY ANGIOGRAPHY;  Surgeon: Swaziland, Peter M, MD;  Location: [facility] INVASIVE CV LAB;  Service: Cardiovascular;  Laterality: N/A;   REMOVAL OF STONES  03/28/2021   Procedure: REMOVAL OF STONES;  Surgeon: Malissa Hippo, MD;  Location: [facility];  Service: Endoscopy;;   SPHINCTEROTOMY  03/28/2021   Procedure: SPHINCTEROTOMY;  Surgeon: Malissa Hippo, MD;  Location: [facility];  Service: Endoscopy;;   UMBILICAL HERNIA REPAIR N/A 04/03/2021   Procedure: ADULT PRIMARY UMBILICAL HERNIA REPAIR;  Surgeon: Lucretia Roers, MD;  Location: [facility];  Service: General;  Laterality: N/A;   VASCULAR SURGERY      Social History: Social History   Socioeconomic History   Marital status: Single    Spouse name: Not on file   Number of children: Not on file   Years of education: Not on file   Highest education level: Not on file  Occupational History   Occupation: Retired  Tobacco Use    Smoking status: Former    Current packs/day: 0.00    Average packs/day: 2.0 packs/day for 18.0 years (36.0 ttl pk-yrs)    Types: Cigarettes    Start date: 12/01/1962    Quit date: 12/01/1980    Years since quitting: 42.9   Smokeless tobacco: Never  Vaping Use   Vaping status: Never Used  Substance and Sexual Activity   Alcohol use: No   Drug use: Yes    Types: Oxycodone, Morphine    Comment: takes methadone   Sexual activity: Not Currently  Other Topics Concern   Not on file  Social History Narrative   Not on file   Social Determinants of Health   Financial Resource Strain: Medium Risk (08/17/2023)   Received [facility]   Overall Financial Resource Strain (CARDIA)    Difficulty of Paying Living Expenses: Somewhat hard  Food Insecurity: Food Insecurity Present (08/17/2023)   Received from [facility]   Hunger Vital Sign    Worried About Running Out of Food in the Last Year: Sometimes true    Ran Out of Food in the Last Year: Sometimes true  Transportation Needs: No Transportation Needs (08/17/2023)   Received from [facility]   PRAPARE - Transportation    In the past 12 months, has lack of transportation kept you from medical appointments or from getting medications?: No    Lack of Transportation (Non-Medical): No  Physical Activity: Inactive (08/17/2023)   Received from [facility]   Exercise Vital Sign    Days of Exercise per Week: 0 days    Minutes of Exercise per Session: 0 min  Stress: No Stress Concern Present (08/17/2023)   Received from [facility] - Occupational Stress Questionnaire    Feeling of Stress : Only a little  Social Connections: Moderately Isolated (08/17/2023)   Received from [facility]   Social Connection and Isolation Panel [NHANES]    Frequency of Communication with Friends and Family: Three times  a week    Frequency of Social Gatherings with Friends and Family: Three times a week    Attends Religious Services: More than 4 times per year    Active Member of Clubs or Organizations: No    Attends Banker Meetings: Never    Marital Status: Divorced  Catering manager Violence: Not At Risk (02/07/2021)   Humiliation, Afraid, Rape, and Kick questionnaire  Fear of Current or Ex-Partner: No    Emotionally Abused: No    Physically Abused: No    Sexually Abused: No    Family History: Family History  Problem Relation Age of Onset   CAD Father 37   Heart attack Father    COPD Sister    CAD Paternal Grandmother    Sudden Cardiac Death Neg Hx    Colon polyps Neg Hx    Colon cancer Neg Hx     Current Medications:  Current Outpatient Medications:    acetaminophen (TYLENOL) 325 MG tablet, Take 2 tablets (650 mg total) by mouth every 6 (six) hours as needed for mild pain, fever or headache., Disp: 12 tablet, Rfl: 0   amLODipine (NORVASC) 5 MG tablet, TAKE 1 TABLET EVERY DAY, Disp: 90 tablet, Rfl: 3   apixaban (ELIQUIS) 5 MG TABS tablet, Take 5 mg by mouth 2 (two) times daily., Disp: , Rfl:    atorvastatin (LIPITOR) 80 MG tablet, TAKE 1 TABLET EVERY MORNING, Disp: 90 tablet, Rfl: 3   b complex vitamins tablet, Take 1 tablet by mouth every morning., Disp: 30 tablet, Rfl: 0   carboxymethylcellulose (REFRESH PLUS) 0.5 % SOLN, Place 1 drop into both eyes as needed., Disp: , Rfl:    clopidogrel (PLAVIX) 75 MG tablet, Take 1 tablet (75 mg total) by mouth daily., Disp: 90 tablet, Rfl: 2   DULoxetine (CYMBALTA) 30 MG capsule, Take 3 capsules (90 mg total) by mouth daily. (Patient taking differently: Take 30 mg by mouth 2 (two) times daily.), Disp: 30 capsule, Rfl: 0   ferrous sulfate 325 (65 FE) MG tablet, Take 325 mg by mouth daily., Disp: , Rfl:    HYDROmorphone (DILAUDID) 2 MG tablet, Take 1 tablet (2 mg total) by mouth every 6 (six) hours as needed for severe pain (breakthrough  pain)., Disp: 15 tablet, Rfl: 0   losartan (COZAAR) 100 MG tablet, TAKE 1 TABLET EVERY DAY, Disp: 90 tablet, Rfl: 0   methadone (DOLOPHINE) 5 MG tablet, Take 5 mg by mouth every 8 (eight) hours., Disp: , Rfl:    metoprolol succinate (TOPROL-XL) 100 MG 24 hr tablet, TAKE 1 AND 1/2 TABLETS EVERY DAY. DOSE INCREASE., Disp: 135 tablet, Rfl: 3   Multiple Vitamin (MULTIVITAMIN ADULT PO), Take 1 tablet by mouth daily., Disp: , Rfl:    Multiple Vitamins-Minerals (EQ VISION FORMULA 50+ PO), Take 1 tablet by mouth every morning., Disp: , Rfl:    naloxone (NARCAN) nasal spray 4 mg/0.1 mL, Place into the nose., Disp: , Rfl:    nitroGLYCERIN (NITROSTAT) 0.4 MG SL tablet, Place 1 tablet (0.4 mg total) under the tongue every 5 (five) minutes x 3 doses as needed for chest pain (if no relief after 3rd dose, proceed to the ED or call 911)., Disp: 25 tablet, Rfl: 3   oxyCODONE (OXY IR/ROXICODONE) 5 MG immediate release tablet, Take by mouth., Disp: , Rfl:    Oxycodone HCl 10 MG TABS, Take 10 mg by mouth 3 (three) times daily as needed., Disp: , Rfl:    PAIN MANAGEMENT INTRATHECAL, IT, PUMP, by Intrathecal route., Disp: , Rfl:    pregabalin (LYRICA) 50 MG capsule, Take 50 mg by mouth daily as needed (pain)., Disp: , Rfl:    Allergies: Allergies  Allergen Reactions   Brilinta [Ticagrelor] Diarrhea and Nausea Only    Nausea and severe diarrhea- general weakness. Patient says does not want to take again   Budesonide Palpitations   Albuterol Other (See Comments)  Heart racing     REVIEW OF SYSTEMS:   Review of Systems  Constitutional:  Negative for chills, fatigue and fever.  HENT:   Negative for lump/mass, mouth sores, nosebleeds, sore throat and trouble swallowing.   Eyes:  Negative for eye problems.  Respiratory:  Negative for cough and shortness of breath.   Cardiovascular:  Negative for chest pain, leg swelling and palpitations.  Gastrointestinal:  Negative for abdominal pain, constipation, diarrhea,  nausea and vomiting.  Genitourinary:  Negative for bladder incontinence, difficulty urinating, dysuria, frequency, hematuria and nocturia.   Musculoskeletal:  Negative for arthralgias, back pain, flank pain, myalgias and neck pain.  Skin:  Negative for itching and rash.  Neurological:  Negative for dizziness, headaches and numbness.  Hematological:  Does not bruise/bleed easily.  Psychiatric/Behavioral:  Negative for depression, sleep disturbance and suicidal ideas. The patient is not nervous/anxious.   All other systems reviewed and are negative.    VITALS:   There were no vitals taken for this visit.  Wt Readings from Last 3 Encounters:  08/28/23 191 lb (86.6 kg)  04/07/23 215 lb (97.5 kg)  03/15/23 215 lb (97.5 kg)    There is no height or weight on file to calculate BMI.  Performance status (ECOG): 4 - Bedbound  PHYSICAL EXAM:   Physical Exam Vitals and nursing note reviewed. Exam conducted with a chaperone present.  Constitutional:      Appearance: Normal appearance.  Cardiovascular:     Rate and Rhythm: Normal rate and regular rhythm.     Pulses: Normal pulses.     Heart sounds: Normal heart sounds.  Pulmonary:     Effort: Pulmonary effort is normal.     Breath sounds: Normal breath sounds.  Abdominal:     Palpations: Abdomen is soft. There is no hepatomegaly, splenomegaly or mass.     Tenderness: There is no abdominal tenderness.  Musculoskeletal:     Right lower leg: No edema.     Left lower leg: No edema.  Lymphadenopathy:     Cervical: No cervical adenopathy.     Right cervical: No superficial, deep or posterior cervical adenopathy.    Left cervical: No superficial, deep or posterior cervical adenopathy.     Upper Body:     Right upper body: No supraclavicular or axillary adenopathy.     Left upper body: No supraclavicular or axillary adenopathy.  Neurological:     General: No focal deficit present.     Mental Status: She is alert and oriented to person,  place, and time.  Psychiatric:        Mood and Affect: Mood normal.        Behavior: Behavior normal.    Breast Exam Chaperone: Anne Fu, LPN   LABS:      Latest Ref Rng & Units 10/06/2023   12:56 PM 09/08/2023    1:23 PM 08/28/2023    7:00 PM  CBC  WBC 4.0 - 10.5 K/uL 4.4  7.8  4.9   Hemoglobin 12.0 - 15.0 g/dL 95.2  84.1  32.4   Hematocrit 36.0 - 46.0 % 32.1  32.7  36.5   Platelets 150 - 400 K/uL 227  202  177       Latest Ref Rng & Units 10/06/2023   12:56 PM 09/08/2023    1:23 PM 08/28/2023    7:00 PM  CMP  Glucose 70 - 99 mg/dL 401  027  253   BUN 8 - 23 mg/dL 14  18  16   Creatinine 0.44 - 1.00 mg/dL 0.98  1.19  1.47   Sodium 135 - 145 mmol/L 138  138  139   Potassium 3.5 - 5.1 mmol/L 4.5  4.9  4.4   Chloride 98 - 111 mmol/L 101  99  101   CO2 22 - 32 mmol/L 30  32  29   Calcium 8.9 - 10.3 mg/dL 9.1  8.8  9.2   Total Protein 6.5 - 8.1 g/dL 6.1  5.9  6.1   Total Bilirubin <1.2 mg/dL 0.8  0.8  2.0   Alkaline Phos 38 - 126 U/L 420  128  83   AST 15 - 41 U/L 66  67  91   ALT 0 - 44 U/L 73  75  35    Component     Latest Ref Rng 09/09/2022 12/08/2022 03/02/2023  CA 15-3     0.0 - 25.0 U/mL 18.0  19.8  18.0     Component     Latest Ref Rng 09/09/2022 12/08/2022 03/02/2023  CA 27.29     0.0 - 38.6 U/mL 21.2  17.9  15.3     No results found for: "CEA1", "CEA" / No results found for: "CEA1", "CEA" No results found for: "PSA1" No results found for: "WGN562" No results found for: "CAN125"  No results found for: "TOTALPROTELP", "ALBUMINELP", "A1GS", "A2GS", "BETS", "BETA2SER", "GAMS", "MSPIKE", "SPEI" Lab Results  Component Value Date   TIBC 273 09/09/2022   TIBC 289 06/09/2022   TIBC 274 10/01/2021   FERRITIN 47 09/09/2022   FERRITIN 36 06/09/2022   FERRITIN 67 10/01/2021   IRONPCTSAT 25 09/09/2022   IRONPCTSAT 23 06/09/2022   IRONPCTSAT 20 10/01/2021   No results found for: "LDH"   STUDIES:   No results found.

## 2023-11-03 NOTE — Patient Instructions (Signed)
[facility] [place] - A DEPT OF [facility]  Discharge Instructions: Thank you for choosing [facility] to provide your oncology and hematology care.  If you have a lab appointment with the Cancer Center - please note that after April 8th, 2024, all labs will be drawn in the cancer center.  You do not have to check in or register with the main entrance as you have in the past but will complete your check-in in the cancer center.  Wear comfortable clothing and clothing appropriate for easy access to any Portacath or PICC line.   We strive to give you quality time with your provider. You may need to reschedule your appointment if you arrive late (15 or more minutes).  Arriving late affects you and other patients whose appointments are after yours.  Also, if you miss three or more appointments without notifying the office, you may be dismissed from the clinic at the provider's discretion.      For prescription refill requests, have your pharmacy contact our office and allow 72 hours for refills to be completed.    IATELY: *FEVER GREATER THAN 100.4 F (38 C) OR HIGHER *CHILLS OR SWEATING *NAUSEA AND VOMITING THAT IS NOT CONTROLLED WITH YOUR NAUSEA MEDICATION *UNUSUAL SHORTNESS OF BREATH *UNUSUAL BRUISING OR BLEEDING *URINARY PROBLEMS (pain or burning when urinating, or frequent urination) *BOWEL PROBLEMS (unusual diarrhea, constipation, pain near the anus) TENDERNESS IN MOUTH AND THROAT WITH OR WITHOUT PRESENCE OF ULCERS (sore throat, sores in mouth, or a toothache) UNUSUAL RASH, SWELLING OR PAIN  UNUSUAL VAGINAL DISCHARGE OR ITCHING   Items with * indicate a potential emergency and should be followed up as soon as possible or go to the Emergency Department if any problems should occur.  Please show the CHEMOTHERAPY ALERT CARD or IMMUNOTHERAPY ALERT CARD at check-in to the Emergency Department and triage nurse.  Should you have questions after your visit  or need to cancel or reschedule your appointment, please contact [facility] [place] - A DEPT OF Eligha Bridegroom [facility] [phone]  and follow the prompts.  Office hours are 8:00 a.m. to 4:30 p.m. Monday - Friday. Please note that voicemails left after 4:00 p.m. may not be returned until the following business day.  We are closed weekends and major holidays. You have access to a nurse at all times for urgent questions. Please call the main number to the clinic [phone] and follow the prompts.  For any non-urgent questions, you may also contact your provider using MyChart. We now offer e-Visits for anyone 10 and older to request care online for non-urgent symptoms. For details visit [email or web address].   Also download the MyChart app! Go to the app store, search "MyChart", open the app, select [place], and log in with your MyChart username and password.  [facility]  - A DEPT [facility]  Discharge Instructions: Thank you for choosing [facility] to provide your oncology and hematology care.  If you have a lab appointment with the Cancer Center - please note that after April 8th, 2024, all labs will be drawn in the cancer center.  You do not have to check in or register with the main entrance as you have in the past but will complete your check-in in the cancer center.  Wear comfortable clothing and clothing appropriate for easy access to any Portacath or PICC line.   We strive to  give you quality time with your provider. You may need to reschedule your appointment if you arrive late (15 or more minutes).  Arriving late affects you and other patients whose appointments are after yours.  Also, if you miss three or more appointments without notifying the office, you may be dismissed from the clinic at the provider's discretion.      For prescription refill requests, have your pharmacy contact our office and allow 72 hours for  refills to be completed.    Today you received Faslodex and Xgeva   To help prevent nausea and vomiting after your treatment, we encourage you to take your nausea medication as directed.  BELOW ARE SYMPTOMS THAT SHOULD BE REPORTED IMMEDIATELY: *FEVER GREATER THAN 100.4 F (38 C) OR HIGHER *CHILLS OR SWEATING *NAUSEA AND VOMITING THAT IS NOT CONTROLLED WITH YOUR NAUSEA MEDICATION *UNUSUAL SHORTNESS OF BREATH *UNUSUAL BRUISING OR BLEEDING *URINARY PROBLEMS (pain or burning when urinating, or frequent urination) *BOWEL PROBLEMS (unusual diarrhea, constipation, pain near the anus) TENDERNESS IN MOUTH AND THROAT WITH OR WITHOUT PRESENCE OF ULCERS (sore throat, sores in mouth, or a toothache) UNUSUAL RASH, SWELLING OR PAIN  UNUSUAL VAGINAL DISCHARGE OR ITCHING   Items with * indicate a potential emergency and should be followed up as soon as possible or go to the Emergency Department if any problems should occur.  Please show the CHEMOTHERAPY ALERT CARD or IMMUNOTHERAPY ALERT CARD at check-in to the Emergency Department and triage nurse.  Should you have questions after your visit or need to cancel or reschedule your appointment, please contact [facility] [place] - A DEPT OF Eligha Bridegroom [facility] [phone]  and follow the prompts.  Office hours are 8:00 a.m. to 4:30 p.m. Monday - Friday. Please note that voicemails left after 4:00 p.m. may not be returned until the following business day.  We are closed weekends and major holidays. You have access to a nurse at all times for urgent questions. Please call the main number to the clinic [phone] and follow the prompts.  For any non-urgent questions, you may also contact your provider using MyChart. We now offer e-Visits for anyone 5 and older to request care online for non-urgent symptoms. For details visit [email or web address].   Also download the MyChart app! Go to the app store, search "MyChart", open the app, select  [place], and log in with your MyChart username and password.

## 2023-11-19 ENCOUNTER — Ambulatory Visit (HOSPITAL_COMMUNITY): Admission: RE | Admit: 2023-11-19 | Payer: Medicare Other | Source: Ambulatory Visit

## 2023-11-30 ENCOUNTER — Other Ambulatory Visit: Payer: Self-pay

## 2023-11-30 DIAGNOSIS — C50911 Malignant neoplasm of unspecified site of right female breast: Secondary | ICD-10-CM

## 2023-12-01 ENCOUNTER — Inpatient Hospital Stay: Payer: Medicare Other

## 2023-12-01 ENCOUNTER — Inpatient Hospital Stay: Payer: Medicare Other | Admitting: Hematology

## 2023-12-01 VITALS — BP 123/62 | HR 68 | Temp 97.4°F | Resp 18

## 2023-12-01 DIAGNOSIS — C50911 Malignant neoplasm of unspecified site of right female breast: Secondary | ICD-10-CM

## 2023-12-01 DIAGNOSIS — Z5111 Encounter for antineoplastic chemotherapy: Secondary | ICD-10-CM | POA: Diagnosis not present

## 2023-12-01 LAB — COMPREHENSIVE METABOLIC PANEL
ALT: 16 U/L (ref 0–44)
AST: 24 U/L (ref 15–41)
Albumin: 2.9 g/dL — ABNORMAL LOW (ref 3.5–5.0)
Alkaline Phosphatase: 67 U/L (ref 38–126)
Anion gap: 5 (ref 5–15)
BUN: 15 mg/dL (ref 8–23)
CO2: 30 mmol/L (ref 22–32)
Calcium: 8.9 mg/dL (ref 8.9–10.3)
Chloride: 103 mmol/L (ref 98–111)
Creatinine, Ser: 0.57 mg/dL (ref 0.44–1.00)
GFR, Estimated: >60 mL/min (ref >60)
Glucose, Bld: 141 mg/dL — ABNORMAL HIGH (ref 70–99)
Potassium: 4.4 mmol/L (ref 3.5–5.1)
Sodium: 138 mmol/L (ref 135–145)
Total Bilirubin: 1.1 mg/dL (ref 0.0–1.2)
Total Protein: 6 g/dL — ABNORMAL LOW (ref 6.5–8.1)

## 2023-12-01 LAB — CBC WITH DIFFERENTIAL/PLATELET
Abs Immature Granulocytes: 0.01 10*3/uL (ref 0.00–0.07)
Basophils Absolute: 0 10*3/uL (ref 0.0–0.1)
Basophils Relative: 1 %
Eosinophils Absolute: 0.1 10*3/uL (ref 0.0–0.5)
Eosinophils Relative: 3 %
HCT: 33.7 % — ABNORMAL LOW (ref 36.0–46.0)
Hemoglobin: 11 g/dL — ABNORMAL LOW (ref 12.0–15.0)
Immature Granulocytes: 0 %
Lymphocytes Relative: 28 %
Lymphs Abs: 1.1 10*3/uL (ref 0.7–4.0)
MCH: 33.3 pg (ref 26.0–34.0)
MCHC: 32.6 g/dL (ref 30.0–36.0)
MCV: 102.1 fL — ABNORMAL HIGH (ref 80.0–100.0)
Monocytes Absolute: 0.3 10*3/uL (ref 0.1–1.0)
Monocytes Relative: 7 %
Neutro Abs: 2.4 10*3/uL (ref 1.7–7.7)
Neutrophils Relative %: 61 %
Platelets: 180 10*3/uL (ref 150–400)
RBC: 3.3 MIL/uL — ABNORMAL LOW (ref 3.87–5.11)
RDW: 14.4 % (ref 11.5–15.5)
WBC: 3.9 10*3/uL — ABNORMAL LOW (ref 4.0–10.5)
nRBC: 0 % (ref 0.0–0.2)

## 2023-12-01 MED ORDER — FULVESTRANT 250 MG/5ML IM SOSY
500.0000 mg | PREFILLED_SYRINGE | Freq: Once | INTRAMUSCULAR | Status: AC
  Administered 2023-12-01: 500 mg via INTRAMUSCULAR
  Filled 2023-12-01: qty 10

## 2023-12-01 MED ORDER — DENOSUMAB 120 MG/1.7ML ~~LOC~~ SOLN
120.0000 mg | Freq: Once | SUBCUTANEOUS | Status: AC
  Administered 2023-12-01: 120 mg via SUBCUTANEOUS
  Filled 2023-12-01: qty 1.7

## 2023-12-01 NOTE — Progress Notes (Signed)
Patient taking calcium as directed. Denied tooth, jaw, and leg pain. No recent or upcoming dental visits. Labs reviewed. Patient tolerated injection with no complaints voiced. See MAR for details. Patient stable during and after injection. Site clean and dry with no bruising or swelling noted. Band aid applied.  Patient tolerated Faslodex injection with no complaints voiced. Bilateral sites clean and dry with no bruising or swelling noted. Band aids applied. See MAR for details. Patient stable during and after injections. VSS with discharge and left in satisfactory condition with no s/s of distress noted.  

## 2023-12-07 ENCOUNTER — Emergency Department (HOSPITAL_COMMUNITY): Payer: Medicare Other

## 2023-12-07 ENCOUNTER — Emergency Department (HOSPITAL_COMMUNITY)
Admission: EM | Admit: 2023-12-07 | Discharge: 2023-12-08 | Disposition: A | Discharge status: Home / Self Care | Payer: Medicare Other | Attending: Student | Admitting: Student

## 2023-12-07 ENCOUNTER — Other Ambulatory Visit: Payer: Self-pay

## 2023-12-07 ENCOUNTER — Encounter (HOSPITAL_COMMUNITY): Payer: Self-pay | Admitting: *Deleted

## 2023-12-07 DIAGNOSIS — M4856XA Collapsed vertebra, not elsewhere classified, lumbar region, initial encounter for fracture: Secondary | ICD-10-CM | POA: Insufficient documentation

## 2023-12-07 DIAGNOSIS — Z79899 Other long term (current) drug therapy: Secondary | ICD-10-CM | POA: Insufficient documentation

## 2023-12-07 DIAGNOSIS — J449 Chronic obstructive pulmonary disease, unspecified: Secondary | ICD-10-CM | POA: Diagnosis not present

## 2023-12-07 DIAGNOSIS — I480 Paroxysmal atrial fibrillation: Secondary | ICD-10-CM | POA: Insufficient documentation

## 2023-12-07 DIAGNOSIS — Z853 Personal history of malignant neoplasm of breast: Secondary | ICD-10-CM | POA: Diagnosis not present

## 2023-12-07 DIAGNOSIS — M545 Low back pain, unspecified: Secondary | ICD-10-CM | POA: Diagnosis present

## 2023-12-07 DIAGNOSIS — Z7901 Long term (current) use of anticoagulants: Secondary | ICD-10-CM | POA: Insufficient documentation

## 2023-12-07 DIAGNOSIS — I700 Atherosclerosis of aorta: Secondary | ICD-10-CM | POA: Diagnosis not present

## 2023-12-07 DIAGNOSIS — M546 Pain in thoracic spine: Secondary | ICD-10-CM | POA: Insufficient documentation

## 2023-12-07 DIAGNOSIS — M47816 Spondylosis without myelopathy or radiculopathy, lumbar region: Secondary | ICD-10-CM | POA: Diagnosis not present

## 2023-12-07 DIAGNOSIS — I2510 Atherosclerotic heart disease of native coronary artery without angina pectoris: Secondary | ICD-10-CM | POA: Diagnosis not present

## 2023-12-07 DIAGNOSIS — M48061 Spinal stenosis, lumbar region without neurogenic claudication: Secondary | ICD-10-CM | POA: Diagnosis not present

## 2023-12-07 DIAGNOSIS — G8929 Other chronic pain: Secondary | ICD-10-CM | POA: Insufficient documentation

## 2023-12-07 DIAGNOSIS — M4805 Spinal stenosis, thoracolumbar region: Secondary | ICD-10-CM | POA: Insufficient documentation

## 2023-12-07 DIAGNOSIS — I509 Heart failure, unspecified: Secondary | ICD-10-CM | POA: Diagnosis not present

## 2023-12-07 DIAGNOSIS — N2889 Other specified disorders of kidney and ureter: Secondary | ICD-10-CM | POA: Diagnosis present

## 2023-12-07 DIAGNOSIS — I110 Hypertensive heart disease with heart failure: Secondary | ICD-10-CM | POA: Insufficient documentation

## 2023-12-07 LAB — CBC WITH DIFFERENTIAL/PLATELET
Abs Immature Granulocytes: 0.02 10*3/uL (ref 0.00–0.07)
Basophils Absolute: 0 10*3/uL (ref 0.0–0.1)
Basophils Relative: 1 %
Eosinophils Absolute: 0.1 10*3/uL (ref 0.0–0.5)
Eosinophils Relative: 3 %
HCT: 34.3 % — ABNORMAL LOW (ref 36.0–46.0)
Hemoglobin: 11.2 g/dL — ABNORMAL LOW (ref 12.0–15.0)
Immature Granulocytes: 1 %
Lymphocytes Relative: 31 %
Lymphs Abs: 1.4 10*3/uL (ref 0.7–4.0)
MCH: 33.3 pg (ref 26.0–34.0)
MCHC: 32.7 g/dL (ref 30.0–36.0)
MCV: 102.1 fL — ABNORMAL HIGH (ref 80.0–100.0)
Monocytes Absolute: 0.3 10*3/uL (ref 0.1–1.0)
Monocytes Relative: 7 %
Neutro Abs: 2.6 10*3/uL (ref 1.7–7.7)
Neutrophils Relative %: 57 %
Platelets: 188 10*3/uL (ref 150–400)
RBC: 3.36 MIL/uL — ABNORMAL LOW (ref 3.87–5.11)
RDW: 14.2 % (ref 11.5–15.5)
WBC: 4.4 10*3/uL (ref 4.0–10.5)
nRBC: 0 % (ref 0.0–0.2)

## 2023-12-07 LAB — COMPREHENSIVE METABOLIC PANEL
ALT: 13 U/L (ref 0–44)
AST: 20 U/L (ref 15–41)
Albumin: 3.2 g/dL — ABNORMAL LOW (ref 3.5–5.0)
Alkaline Phosphatase: 61 U/L (ref 38–126)
Anion gap: 5 (ref 5–15)
BUN: 16 mg/dL (ref 8–23)
CO2: 33 mmol/L — ABNORMAL HIGH (ref 22–32)
Calcium: 9.1 mg/dL (ref 8.9–10.3)
Chloride: 101 mmol/L (ref 98–111)
Creatinine, Ser: 0.54 mg/dL (ref 0.44–1.00)
GFR, Estimated: >60 mL/min (ref >60)
Glucose, Bld: 117 mg/dL — ABNORMAL HIGH (ref 70–99)
Potassium: 4.2 mmol/L (ref 3.5–5.1)
Sodium: 139 mmol/L (ref 135–145)
Total Bilirubin: 1.2 mg/dL (ref 0.0–1.2)
Total Protein: 6.3 g/dL — ABNORMAL LOW (ref 6.5–8.1)

## 2023-12-07 MED ORDER — KETOROLAC TROMETHAMINE 30 MG/ML IJ SOLN
15.0000 mg | Freq: Once | INTRAMUSCULAR | Status: AC
  Administered 2023-12-07: 15 mg via INTRAVENOUS
  Filled 2023-12-07: qty 1

## 2023-12-07 MED ORDER — FENTANYL CITRATE PF 50 MCG/ML IJ SOSY
50.0000 ug | PREFILLED_SYRINGE | Freq: Once | INTRAMUSCULAR | Status: AC
  Administered 2023-12-07: 50 ug via INTRAVENOUS
  Filled 2023-12-07: qty 1

## 2023-12-07 MED ORDER — METHOCARBAMOL 500 MG PO TABS: 500.0000 mg | ORAL_TABLET | Freq: Three times a day (TID) | ORAL | 0 refills | Status: DC

## 2023-12-07 MED ORDER — LIDOCAINE 5 % EX PTCH
1.0000 | MEDICATED_PATCH | CUTANEOUS | Status: DC
  Administered 2023-12-07 (×2): 1 via TRANSDERMAL
  Filled 2023-12-07 (×2): qty 1

## 2023-12-07 MED ORDER — LIDOCAINE 5 % EX PTCH: 1.0000 | MEDICATED_PATCH | CUTANEOUS | 0 refills | Status: DC

## 2023-12-07 MED ORDER — OXYCODONE-ACETAMINOPHEN 5-325 MG PO TABS
1.0000 | ORAL_TABLET | Freq: Once | ORAL | Status: AC
Start: 2023-12-07 — End: 2023-12-07
  Administered 2023-12-07: 1 via ORAL
  Filled 2023-12-07: qty 1

## 2023-12-07 NOTE — ED Triage Notes (Signed)
 Pt with right sided back pain that has increased today  and rt breast pain.  Pt with hx of breast cancer, pt states she took her morphine pill about 2-3 hours ago.

## 2023-12-07 NOTE — ED Notes (Signed)
 Patient transported to MRI

## 2023-12-07 NOTE — ED Provider Notes (Signed)
 [place] [facility]ENC[facility] DEPARTMENT AT [facility] Provider Note   CSN: [id number] Arrival date & time: 12/07/23  1404     History  Chief Complaint  Patient presents with   Back Pain    Stacey [name] is a 78 y.o. female.   Back Pain Associated symptoms: no abdominal pain, no chest pain, no numbness and no weakness        Stacey [name] is a 78 y.o. female past medical history of hypertension, metastatic breast cancer CHF, CAD paroxysmal atrial fibrillation, lymphedema and COPD who presents to the Emergency Department complaining of gradually worsening right-sided back pain since yesterday.  History of breast cancer currently undergoing injections no radiation or chemotherapy.  Pain is gradually spread through most of her right upper and lower back.  States that her son noticed a red spot to her back yesterday but she states the area has since resolved.  Nothing makes her symptoms better or worse.  She denies known injury.  Takes hydromorphone  for pain, has taken 2 since 12 noon without improvement of her symptoms.  Denies any rash, abdominal pain, urine or bowel changes, numbness or weakness of her lower extremities.  Home Medications Prior to Admission medications   Medication Sig Start Date End Date Taking? Authorizing Provider  acetaminophen  (TYLENOL ) 325 MG tablet Take 2 tablets (650 mg total) by mouth every 6 (six) hours as needed for mild pain, fever or headache. 04/05/21   Pearlean Manus, MD  amLODipine  (NORVASC ) 5 MG tablet TAKE 1 TABLET EVERY DAY 05/12/23   Branch, Dorn FALCON, MD  apixaban  (ELIQUIS ) 5 MG TABS tablet Take 5 mg by mouth 2 (two) times daily. 04/01/23   [provider]  atorvastatin  (LIPITOR ) 80 MG tablet TAKE 1 TABLET EVERY MORNING 08/05/23   Alvan Dorn FALCON, MD  b complex vitamins tablet Take 1 tablet by mouth every morning. 07/05/20   Jerri Keys, MD  carboxymethylcellulose (REFRESH PLUS) 0.5 % SOLN Place 1 drop into both eyes as needed.     [provider]  clopidogrel  (PLAVIX ) 75 MG tablet Take 1 tablet (75 mg total) by mouth daily. 08/25/23   Alvan Dorn FALCON, MD  DULoxetine  (CYMBALTA ) 30 MG capsule Take 3 capsules (90 mg total) by mouth daily. Patient taking differently: Take 30 mg by mouth 2 (two) times daily. 07/06/20   Jerri Keys, MD  ferrous sulfate  325 (65 FE) MG tablet Take 325 mg by mouth daily.    [provider]  HYDROmorphone  (DILAUDID ) 2 MG tablet Take 1 tablet (2 mg total) by mouth every 6 (six) hours as needed for severe pain (breakthrough pain). 02/26/23   Harris, Abigail, PA-C  losartan  (COZAAR ) 100 MG tablet TAKE 1 TABLET EVERY DAY 11/02/23   Alvan Dorn FALCON, MD  methadone  (DOLOPHINE ) 5 MG tablet Take 5 mg by mouth every 8 (eight) hours. 09/03/23   [provider]  metoprolol  succinate (TOPROL -XL) 100 MG 24 hr tablet TAKE 1 AND 1/2 TABLETS EVERY DAY. DOSE INCREASE. 07/01/23   Alvan Dorn FALCON, MD  Multiple Vitamin (MULTIVITAMIN ADULT PO) Take 1 tablet by mouth daily.    [provider]  Multiple Vitamins-Minerals (EQ VISION FORMULA 50+ PO) Take 1 tablet by mouth every morning.    [provider]  naloxone  (NARCAN ) nasal spray 4 mg/0.1 mL Place into the nose. 02/27/23   [provider]  nitroGLYCERIN  (NITROSTAT ) 0.4 MG SL tablet Place 1 tablet (0.4 mg total) under the tongue every 5 (five) minutes x  3 doses as needed for chest pain (if no relief after 3rd dose, proceed to the ED or call 911). 05/05/23   Branch, Dorn FALCON, MD  oxyCODONE  (OXY IR/ROXICODONE ) 5 MG immediate release tablet Take by mouth. 01/07/23   [provider]  Oxycodone  HCl 10 MG TABS Take 10 mg by mouth 3 (three) times daily as needed. 08/26/22   [provider]  PAIN MANAGEMENT INTRATHECAL, IT, PUMP by Intrathecal route. 02/21/21   [provider]  pregabalin  (LYRICA ) 50 MG capsule Take 50 mg by mouth daily as needed (pain). 01/26/21   [provider]      Allergies     Brilinta  [ticagrelor ], Budesonide , and Albuterol     Review of Systems   Review of Systems  Respiratory:  Negative for shortness of breath.   Cardiovascular:  Negative for chest pain.  Gastrointestinal:  Negative for abdominal pain, nausea and vomiting.  Musculoskeletal:  Positive for back pain.  Skin:  Negative for color change and rash.  Neurological:  Negative for dizziness, syncope, weakness, light-headedness and numbness.    Physical Exam Updated Vital Signs BP (!) 153/69 (BP Location: Right Arm)   Pulse 79   Resp (!) 22   Ht 5' 6 (1.676 m)   Wt 86.6 kg   SpO2 96%   BMI 30.81 kg/m  Physical Exam Vitals and nursing note reviewed.  Constitutional:      General: She is not in acute distress.    Appearance: Normal appearance. She is not toxic-appearing.  HENT:     Mouth/Throat:     Mouth: Mucous membranes are moist.  Cardiovascular:     Rate and Rhythm: Normal rate and regular rhythm.     Pulses: Normal pulses.  Pulmonary:     Effort: Pulmonary effort is normal.  Musculoskeletal:        General: Tenderness present. No swelling or deformity.     Comments: Diffuse tenderness to palpation right mid to lower thoracic/lumbar spine mild midline tenderness of the thoracolumbar junction.  No skin changes to her back  Skin:    General: Skin is warm.     Capillary Refill: Capillary refill takes less than 2 seconds.     Findings: No rash.  Neurological:     General: No focal deficit present.     Mental Status: She is alert.     Sensory: No sensory deficit.     Motor: No weakness.     ED Results / Procedures / Treatments   Labs (all labs ordered are listed, but only abnormal results are displayed) Labs Reviewed  CBC WITH DIFFERENTIAL/PLATELET - Abnormal; Notable for the following components:      Result Value   RBC 3.36 (*)    Hemoglobin 11.2 (*)    HCT 34.3 (*)    MCV 102.1 (*)    All other components within normal limits  COMPREHENSIVE METABOLIC PANEL - Abnormal;  Notable for the following components:   CO2 33 (*)    Glucose, Bld 117 (*)    Total Protein 6.3 (*)    Albumin  3.2 (*)    All other components within normal limits  URINALYSIS, ROUTINE W REFLEX MICROSCOPIC    EKG None  Radiology MR LUMBAR SPINE WO CONTRAST Result Date: 12/07/2023 CLINICAL DATA:  Initial evaluation for acute low back pain. History of breast cancer. EXAM: MRI LUMBAR SPINE WITHOUT CONTRAST TECHNIQUE: Multiplanar, multisequence MR imaging of the lumbar spine was performed. No intravenous contrast was administered. COMPARISON:  Prior CT  from earlier the same day. FINDINGS: Segmentation: Transitional features about the lumbosacral junction with partial sacralization of the L5 vertebral body. Same numbering system employed as on previous exams. Alignment: Moderate dextroscoliosis with mild exaggeration of the normal lumbar lordosis. 3 mm anterolisthesis of L4 on L5. Vertebrae: Chronic pathologic compression fracture involving the L1 vertebral body again seen. Associated severe complete height loss centrally. Up to 10 mm of bony retropulsion. Appearance is stable from prior CT. This appears chronic in nature with no residual marrow edema. Resultant severe spinal stenosis at this level with the thecal sac measuring 6 mm in AP diameter (series 104, image 9). Associated severe bilateral foraminal narrowing at T12-L1 and L1-2. 2.2 cm heterogeneous sclerotic lesion within the posterior aspect of the L2 vertebral body, presumably reflecting a treated osseous metastasis, stable from prior. No significant residual STIR signal abnormality. No extra osseous extension of tumor or superimposed acute compression fracture at this level. Otherwise, no other worrisome osseous lesions seen within the lumbar spine. Vertebral body height otherwise maintained. Conus medullaris and cauda equina: Conus extends to approximately the L1-2 level level. Severe stenosis at the level of the conus due to the chronic L1  compression fracture. No visible cord signal changes. Nerve roots of the cauda equina otherwise within normal limits. Paraspinal and other soft tissues: Paraspinous soft tissues demonstrate no acute finding. Chronic atrophy noted about the paraspinous musculature. Few scattered T2 hyperintense cyst noted about the kidneys, benign in appearance, no follow-up imaging recommended. Disc levels: T12-L1: Chronic L1 pathologic compression fracture with 10 mm of bony retropulsion. Hyperdense material within the T12-L1 interspace better seen on prior CT, possibly postoperative. Superimposed mild facet hypertrophy. Resultant severe spinal stenosis with severe bilateral foraminal narrowing. L1-2: 10 mm bony retropulsion related to the chronic pathologic L1 compression fracture. Mild bilateral facet hypertrophy. Resultant severe spinal stenosis with severe bilateral foraminal narrowing. L2-3: Mild disc bulge with reactive endplate spurring. Mild to moderate bilateral facet hypertrophy. Resultant mild canal with bilateral subarticular stenosis. Mild bilateral L2 foraminal narrowing. L3-4: Disc desiccation with mild disc bulge. Mild reactive endplate spurring, worse on the right. Mild to moderate facet hypertrophy. No significant spinal stenosis. Mild bilateral L3 foraminal narrowing. L4-5: Anterolisthesis. Disc desiccation with mild disc bulge and reactive endplate spurring. Moderate bilateral facet hypertrophy. No spinal stenosis. Mild to moderate left L4 foraminal narrowing. Right neural foramen remains patent. L5-S1: Negative interspace. Moderate left with mild right facet hypertrophy. No stenosis. IMPRESSION: 1. Chronic pathologic compression fracture involving the L1 vertebral body with up to 10 mm of bony retropulsion. Resultant severe spinal stenosis with severe bilateral foraminal narrowing at the T12-L1 and L1-2 levels. 2. 2.2 cm heterogeneous sclerotic lesion within the posterior aspect of the L2 vertebral body,  presumably reflecting a treated osseous metastasis, stable from prior. No extra osseous extension of tumor or superimposed acute compression fracture at this level. 3. No other evidence for metastatic disease or other acute abnormality within the lumbar spine. 4. Underlying moderate dextroscoliosis with additional multilevel lumbar spondylosis as above. No other high-grade spinal stenosis or overt neural impingement Electronically Signed   By: Morene Hoard M.D.   On: 12/07/2023 20:32   MR THORACIC SPINE WO CONTRAST Result Date: 12/07/2023 CLINICAL DATA:  Initial evaluation for acute back pain. History of breast cancer. EXAM: MRI THORACIC SPINE WITHOUT CONTRAST TECHNIQUE: Multiplanar, multisequence MR imaging of the thoracic spine was performed. No intravenous contrast was administered. COMPARISON:  Prior CT from earlier the same day. FINDINGS: Alignment: Examination somewhat  technically limited by motion artifact. Additionally, the dens is not visualized on the counter sequence. Therefore, vertebral body count is made from the previously identified L1 compression fracture. Mild exaggeration of the normal thoracic kyphosis.  No listhesis. Vertebrae: Chronic L1 pathologic compression fracture, described on corresponding lumbar spine portion of this exam. Otherwise, vertebral body height maintained with no other acute or chronic fracture. Bone marrow signal intensity overall within normal limits. No other worrisome osseous lesions or evidence for metastatic disease within the thoracic spine itself. Degenerative reactive endplate changes noted about the T9-10 interspace anteriorly. No abnormal marrow edema. Cord:  Normal signal and morphology. Paraspinal and other soft tissues: Paraspinous soft tissues demonstrate no acute finding. Few scattered T2 hyperintense cyst noted about the visualized kidneys, benign in appearance, no follow-up imaging recommended. Disc levels: Chronic pathologic compression fracture  of L1 with associated retropulsion and stenosis, discussed on corresponding MRI of the lumbar spine. Otherwise, ordinary for age multilevel disc desiccation with minor noncompressive disc bulging noted within the thoracic spine. No significant focal disc herniation. No other significant spinal stenosis. Mild right foraminal narrowing at T6-7 due to facet spurring. No other significant foraminal encroachment within the thoracic spine itself. IMPRESSION: 1. No evidence for metastatic disease or acute compression fracture within the thoracic spine itself. 2. Chronic pathologic compression fracture of L1 with associated retropulsion and stenosis, discussed on corresponding MRI of the lumbar spine. 3. Otherwise mild for age degenerative spondylosis and facet arthrosis within the thoracic spine. No other significant stenosis or neural impingement. Electronically Signed   By: Morene Hoard M.D.   On: 12/07/2023 20:22   CT Renal Stone Study Result Date: 12/07/2023 CLINICAL DATA:  Right back pain, right breast pain, history breast carcinoma EXAM: CT ABDOMEN AND PELVIS WITHOUT CONTRAST TECHNIQUE: Multidetector CT imaging of the abdomen and pelvis was performed following the standard protocol without IV contrast. RADIATION DOSE REDUCTION: This exam was performed according to the departmental dose-optimization program which includes automated exposure control, adjustment of the mA and/or kV according to patient size and/or use of iterative reconstruction technique. COMPARISON:  11/13/2023 FINDINGS: Lower chest: No pleural or pericardial effusion. 3-vessel coronary calcifications. Scattered aortic calcifications. Visualized lung bases clear. Hepatobiliary: No focal liver abnormality is seen. Status post cholecystectomy. No biliary dilatation. Pancreas: Unremarkable. No pancreatic ductal dilatation or surrounding inflammatory changes. Spleen: Normal in size without focal abnormality. Adrenals/Urinary Tract: No adrenal  mass. No urolithiasis or hydronephrosis. 2.4 cm 8 HU probable cyst, mid left kidney; no follow-up indicated. Urinary bladder partially distended. Stomach/Bowel: Stomach partially distended. Small bowel relatively decompressed. The colon is partially distended, without acute finding. Vascular/Lymphatic: Moderate aortoiliac calcified atheromatous plaque without aneurysm. displaced intimal calcifications in the infrarenal aorta stable since 06/22/2023. No abdominal or pelvic adenopathy. Reproductive: Status post hysterectomy. No adnexal masses. Other: No ascites.  No free air. Musculoskeletal: Implanted intrathecal pump. Severe L2 compression deformity post augmentation, with significant bony retropulsion posteriorly resulting in at least moderate stenosis of the spinal canal, stable since previous. Multilevel lumbar spondylitic change. IMPRESSION: 1. No acute findings. 2. Chronic severe L2 compression deformity post augmentation, with significant bony retropulsion posteriorly resulting in at least moderate stenosis of the spinal canal. 3.  Aortic Atherosclerosis (ICD10-I70.0). Electronically Signed   By: JONETTA Faes M.D.   On: 12/07/2023 16:16   CT L-SPINE NO CHARGE Result Date: 12/07/2023 CLINICAL DATA:  Right sided back pain which is worsening. History of breast cancer. EXAM: CT LUMBAR SPINE WITHOUT CONTRAST TECHNIQUE: Multidetector CT imaging of  the lumbar spine was performed without intravenous contrast administration. Multiplanar CT image reconstructions were also generated. RADIATION DOSE REDUCTION: This exam was performed according to the departmental dose-optimization program which includes automated exposure control, adjustment of the mA and/or kV according to patient size and/or use of iterative reconstruction technique. COMPARISON:  04/24/2023 FINDINGS: Segmentation: 5 lumbar type vertebral bodies as numbered previously. L5 has some transitional features. Alignment: Scoliotic curvature convex to the left.  Vertebrae: Chronic pathologic fracture at L1 with retropulsion of 1 cm, encroaching upon the spinal canal. Amorphous hyperdense material in the region of the vertebral body as well as within the facet joint on the right. Question of this is some sort of surgical fusion material. In any case, there is severe stenosis of the canal at this level due to encroachment by bony material. Chronic sclerotic changes at the anterior superior corner of T10 and the posterior aspect of the L2 vertebral body, presumably related to chronic treated metastatic disease. These areas appear nonprogressive. Paraspinal and other soft tissues: Aortic atherosclerosis. Disc levels: Infusion pump enters the inter spinous space at T10-11 and passes upward within the spinal canal. No compressive stenosis at T10-11 or T11-12. T12-L2: As noted above, there is severe bony stenosis of the canal due to retropulsed bone from a pathologic fracture of L1. See above discussion. Neural compression could occur on either or both sides. Bilateral foraminal stenosis at T12-L1 and L1-2 could additionally cause neural compression. L2-3: Endplate osteophytes. Facet and ligamentous hypertrophy. Stenosis of the lateral recesses and foramina without apparent change. L3-4: Bulging of the disc. Facet degeneration and hypertrophy worse on the left. L4-5: Chronic facet arthropathy with facet fusion. No canal stenosis. Moderate bony foraminal stenosis on the left which is chronic. L5-S1: Transitional level. Sufficient patency of the canal and foramina. Bilateral sacroiliac arthropathy could be painful. IMPRESSION: 1. Chronic pathologic fracture at L1 with retropulsion of 1 cm, encroaching upon the spinal canal. Amorphous hyperdense material in the region of the vertebral body as well as within the facet joint on the right. Question if this is some sort of surgical fusion material. In any case, there is severe stenosis of the canal at this level due to encroachment by  bony material. Neural compression could occur on either or both sides. Bilateral foraminal stenosis at T12-L1 and L1-2 could additionally cause neural compression. 2. Chronic sclerotic changes at the anterior superior corner of T10 and the posterior aspect of the L2 vertebral body, presumably related to chronic treated metastatic disease. These areas appear nonprogressive. 3. L2-3: Endplate osteophytes. Facet and ligamentous hypertrophy. Stenosis of the lateral recesses and foramina without apparent change. 4. L3-4: Bulging of the disc. Facet degeneration and hypertrophy worse on the left. 5. L4-5: Chronic facet arthropathy with facet fusion. No canal stenosis. Moderate bony foraminal stenosis on the left which is chronic. 6. Bilateral sacroiliac arthropathy which could be painful. Aortic Atherosclerosis (ICD10-I70.0). Electronically Signed   By: Oneil Officer M.D.   On: 12/07/2023 16:13    Procedures Procedures    Medications Ordered in ED Medications  lidocaine  (LIDODERM ) 5 % 1 patch (1 patch Transdermal Patch Applied 12/07/23 2013)  ketorolac  (TORADOL ) 30 MG/ML injection 15 mg (15 mg Intravenous Given 12/07/23 1606)  fentaNYL  (SUBLIMAZE ) injection 50 mcg (50 mcg Intravenous Given 12/07/23 1706)  fentaNYL  (SUBLIMAZE ) injection 50 mcg (50 mcg Intravenous Given 12/07/23 1822)  fentaNYL  (SUBLIMAZE ) injection 50 mcg (50 mcg Intravenous Given 12/07/23 2013)    ED Course/ Medical Decision Making/ A&P  Medical Decision Making Patient here for right-sided low back pain.  History of breast cancer.  Takes hydromorphone  for chronic pain, to the 2 of her pain pills this morning before noon without any relief of her symptoms.  Denies known injury.  Describes pain as sharp radiating from right mid upper back down to lumbar area.     No skin changes on exam, neurovascularly intact.  I suspect acute on chronic back pain.  Given patient's cancer history, metastasis is also concern.   Spinal abscess also considered but felt less likely.  No abdominal pain, incontinence and/or retention or saddle anesthesias suggestive of cauda equina   Amount and/or Complexity of Data Reviewed Labs: ordered.    Details: Labs interpreted by me, no evidence of leukocytosis, chemistries without significant derangement. Radiology: ordered.    Details: CT renal stone study shows no acute findings with chronic severe L2 compression deformity post augmentation. CT no charge L-spine shows chronic pathologic fracture at L1 with retropulsion 1 cm encroaching on the spinal canal  MRI thoracic spine no evidence for metastatic disease or acute compression fracture within the thoracic spine,  MRI lumbar spine shows chronic pathologic compression fracture of L1 vertebrae resultant severe spinal stenosis with bilateral foraminal narrowing sclerotic lesion within the posterior aspect of L2 vertebral body felt to represent treated osseous metastasis stable from prior Discussion of management or test interpretation with external provider(s): Patient has been given multiple rounds of IV pain medication, as well as Toradol .  Pain somewhat improved.  Patient was also seen by Dr. Francesca and care plan was discussed On last recheck, she is resting comfortably.  She has hydromorphone  at home that she takes for chronic pain. Feel that she is appropriate for discharge home, will provide prescription for lidocaine  patch and muscle relaxer.  Patient was given strict follow-up instructions with PCP and ER return precautions as well.  Risk Prescription drug management.           Final Clinical Impression(s) / ED Diagnoses Final diagnoses:  Acute right-sided low back pain without sciatica  Acute right-sided thoracic back pain    Rx / DC Orders ED Discharge Orders     None         Herlinda Madelin RIGGERS 12/07/23 2218    Francesca Elsie CROME, MD 12/12/23 1932

## 2023-12-07 NOTE — ED Notes (Signed)
 Patient just returned from MRI where lidocaine patch was removed for scan. Asking for more pain medication for increase in back pain. Will reapply lidocaine patch. Messaged Tammy PA in regards to additional pain medication.

## 2023-12-07 NOTE — Discharge Instructions (Signed)
 Continue to take your pain medication as directed.  You have been prescribed lidocaine  patches to help with your back pain as well as a muscle relaxer.  Please follow-up with your primary care provider later this week for recheck.  Return to the emergency department if you develop any new or worsening symptoms.

## 2023-12-07 NOTE — ED Notes (Signed)
 ED Provider at bedside.

## 2023-12-08 DIAGNOSIS — M545 Low back pain, unspecified: Secondary | ICD-10-CM | POA: Diagnosis not present

## 2023-12-08 MED ORDER — HYDROMORPHONE HCL 1 MG/ML IJ SOLN
2.0000 mg | Freq: Once | INTRAMUSCULAR | Status: AC
  Administered 2023-12-08: 2 mg via INTRAMUSCULAR
  Filled 2023-12-08: qty 2

## 2023-12-08 MED ORDER — ONDANSETRON 8 MG PO TBDP
8.0000 mg | ORAL_TABLET | Freq: Once | ORAL | Status: AC
  Administered 2023-12-08: 8 mg via ORAL
  Filled 2023-12-08: qty 1

## 2023-12-08 NOTE — ED Notes (Signed)
 Pt sleeping. Chest rise and fall noted. Nad. Awaiting convo ems to take home

## 2023-12-08 NOTE — ED Notes (Signed)
 This tech and RN checked pt, pt was not wet. Pulled

## 2023-12-08 NOTE — ED Notes (Signed)
 This Tech and RN attempted to change pt brief, pt stated that she was not wet and would notify us when she needed her brief changed.

## 2023-12-08 NOTE — ED Notes (Signed)
 Patient continues to state that she is having severe back pain. Patient is currently discharged awaiting convalescent transport. Dr. Preston Fleeting notified.

## 2023-12-28 ENCOUNTER — Other Ambulatory Visit: Payer: Self-pay

## 2023-12-28 DIAGNOSIS — C50911 Malignant neoplasm of unspecified site of right female breast: Secondary | ICD-10-CM

## 2023-12-29 ENCOUNTER — Inpatient Hospital Stay: Payer: Medicare Other

## 2023-12-29 ENCOUNTER — Inpatient Hospital Stay: Payer: Medicare Other | Attending: Hematology

## 2023-12-29 VITALS — BP 132/53 | HR 81 | Temp 98.0°F | Resp 18

## 2023-12-29 DIAGNOSIS — C7951 Secondary malignant neoplasm of bone: Secondary | ICD-10-CM | POA: Diagnosis present

## 2023-12-29 DIAGNOSIS — Z1721 Progesterone receptor positive status: Secondary | ICD-10-CM | POA: Diagnosis not present

## 2023-12-29 DIAGNOSIS — Z5111 Encounter for antineoplastic chemotherapy: Secondary | ICD-10-CM | POA: Insufficient documentation

## 2023-12-29 DIAGNOSIS — C50911 Malignant neoplasm of unspecified site of right female breast: Secondary | ICD-10-CM

## 2023-12-29 DIAGNOSIS — Z170 Estrogen receptor positive status [ER+]: Secondary | ICD-10-CM | POA: Insufficient documentation

## 2023-12-29 DIAGNOSIS — Z1732 Human epidermal growth factor receptor 2 negative status: Secondary | ICD-10-CM | POA: Insufficient documentation

## 2023-12-29 LAB — CBC WITH DIFFERENTIAL/PLATELET
Abs Immature Granulocytes: 0.03 10*3/uL (ref 0.00–0.07)
Basophils Absolute: 0 10*3/uL (ref 0.0–0.1)
Basophils Relative: 1 %
Eosinophils Absolute: 0.2 10*3/uL (ref 0.0–0.5)
Eosinophils Relative: 4 %
HCT: 32.6 % — ABNORMAL LOW (ref 36.0–46.0)
Hemoglobin: 10.3 g/dL — ABNORMAL LOW (ref 12.0–15.0)
Immature Granulocytes: 1 %
Lymphocytes Relative: 21 %
Lymphs Abs: 1 10*3/uL (ref 0.7–4.0)
MCH: 32.3 pg (ref 26.0–34.0)
MCHC: 31.6 g/dL (ref 30.0–36.0)
MCV: 102.2 fL — ABNORMAL HIGH (ref 80.0–100.0)
Monocytes Absolute: 0.4 10*3/uL (ref 0.1–1.0)
Monocytes Relative: 8 %
Neutro Abs: 3.1 10*3/uL (ref 1.7–7.7)
Neutrophils Relative %: 65 %
Platelets: 181 10*3/uL (ref 150–400)
RBC: 3.19 MIL/uL — ABNORMAL LOW (ref 3.87–5.11)
RDW: 14 % (ref 11.5–15.5)
WBC: 4.8 10*3/uL (ref 4.0–10.5)
nRBC: 0 % (ref 0.0–0.2)

## 2023-12-29 LAB — COMPREHENSIVE METABOLIC PANEL
ALT: 41 U/L (ref 0–44)
AST: 30 U/L (ref 15–41)
Albumin: 2.7 g/dL — ABNORMAL LOW (ref 3.5–5.0)
Alkaline Phosphatase: 174 U/L — ABNORMAL HIGH (ref 38–126)
Anion gap: 7 (ref 5–15)
BUN: 14 mg/dL (ref 8–23)
CO2: 30 mmol/L (ref 22–32)
Calcium: 9.1 mg/dL (ref 8.9–10.3)
Chloride: 103 mmol/L (ref 98–111)
Creatinine, Ser: 0.62 mg/dL (ref 0.44–1.00)
GFR, Estimated: >60 mL/min (ref >60)
Glucose, Bld: 127 mg/dL — ABNORMAL HIGH (ref 70–99)
Potassium: 4.8 mmol/L (ref 3.5–5.1)
Sodium: 140 mmol/L (ref 135–145)
Total Bilirubin: 0.9 mg/dL (ref 0.0–1.2)
Total Protein: 5.9 g/dL — ABNORMAL LOW (ref 6.5–8.1)

## 2023-12-29 MED ORDER — FULVESTRANT 250 MG/5ML IM SOSY
500.0000 mg | PREFILLED_SYRINGE | Freq: Once | INTRAMUSCULAR | Status: AC
  Administered 2023-12-29: 500 mg via INTRAMUSCULAR
  Filled 2023-12-29: qty 10

## 2023-12-29 MED ORDER — DENOSUMAB 120 MG/1.7ML ~~LOC~~ SOLN
120.0000 mg | Freq: Once | SUBCUTANEOUS | Status: AC
  Administered 2023-12-29: 120 mg via SUBCUTANEOUS
  Filled 2023-12-29: qty 1.7

## 2023-12-29 NOTE — Progress Notes (Signed)
Per pt  she has no recent or upcoming dental visits. Labs reviewed. Patient tolerated Xgeva injection with no complaints voiced. See MAR for details. Patient stable during and after injection. Site clean and dry with no bruising or swelling noted. Band aid applied.    Patient tolerated Faslodex injection with no complaints voiced. Bilateral sites clean and dry with no bruising or swelling noted. Band aids applied. See MAR for details. Patient stable during and after injections. VSS with discharge and left in satisfactory condition with no s/s of distress noted. All follow ups as scheduled.   Stacey [name] [employer]

## 2023-12-31 ENCOUNTER — Other Ambulatory Visit: Payer: Self-pay

## 2023-12-31 ENCOUNTER — Encounter (HOSPITAL_COMMUNITY): Payer: Self-pay | Admitting: Emergency Medicine

## 2023-12-31 ENCOUNTER — Emergency Department (HOSPITAL_COMMUNITY): Payer: Medicare Other

## 2023-12-31 ENCOUNTER — Emergency Department (HOSPITAL_COMMUNITY)
Admission: EM | Admit: 2023-12-31 | Discharge: 2023-12-31 | Disposition: A | Discharge status: Home / Self Care | Payer: Medicare Other | Attending: Emergency Medicine | Admitting: Emergency Medicine

## 2023-12-31 DIAGNOSIS — R079 Chest pain, unspecified: Secondary | ICD-10-CM

## 2023-12-31 DIAGNOSIS — R0789 Other chest pain: Secondary | ICD-10-CM | POA: Insufficient documentation

## 2023-12-31 DIAGNOSIS — Z7901 Long term (current) use of anticoagulants: Secondary | ICD-10-CM | POA: Diagnosis not present

## 2023-12-31 LAB — BASIC METABOLIC PANEL
Anion gap: 6 (ref 5–15)
BUN: 17 mg/dL (ref 8–23)
CO2: 29 mmol/L (ref 22–32)
Calcium: 8.9 mg/dL (ref 8.9–10.3)
Chloride: 102 mmol/L (ref 98–111)
Creatinine, Ser: 0.67 mg/dL (ref 0.44–1.00)
GFR, Estimated: >60 mL/min (ref >60)
Glucose, Bld: 135 mg/dL — ABNORMAL HIGH (ref 70–99)
Potassium: 3.7 mmol/L (ref 3.5–5.1)
Sodium: 137 mmol/L (ref 135–145)

## 2023-12-31 LAB — CBC
HCT: 34.4 % — ABNORMAL LOW (ref 36.0–46.0)
Hemoglobin: 11 g/dL — ABNORMAL LOW (ref 12.0–15.0)
MCH: 33.1 pg (ref 26.0–34.0)
MCHC: 32 g/dL (ref 30.0–36.0)
MCV: 103.6 fL — ABNORMAL HIGH (ref 80.0–100.0)
Platelets: 180 10*3/uL (ref 150–400)
RBC: 3.32 MIL/uL — ABNORMAL LOW (ref 3.87–5.11)
RDW: 14.2 % (ref 11.5–15.5)
WBC: 6.7 10*3/uL (ref 4.0–10.5)
nRBC: 0 % (ref 0.0–0.2)

## 2023-12-31 LAB — TROPONIN I (HIGH SENSITIVITY)
Troponin I (High Sensitivity): 10 ng/L (ref <18)
Troponin I (High Sensitivity): 11 ng/L (ref <18)

## 2023-12-31 NOTE — Discharge Instructions (Signed)
You were seen in the emergency department for an episode of chest pain and shortness of breath.  You had a chest x-ray EKG and lab work that did not show any evidence of heart attack.  Please continue your regular medications and follow-up with your primary care doctor and cardiologist.  Return to the emergency department if any worsening or concerning symptoms

## 2023-12-31 NOTE — ED Triage Notes (Signed)
Pt c/o mid sternal chest pain (can't describe) that woke her from sleep this morning. Denies radiation of pain. Pain has been intermittent, no pain at present.  Pt had shob with the pain. Denies shob at present. Color wnl. A/o.

## 2023-12-31 NOTE — ED Provider Notes (Signed)
[place] EMERGENCY DEPARTMENT AT [facility] Provider Note   CSN: [id number] Arrival date & time: 12/31/23  4098     History  Chief Complaint  Patient presents with   Chest Pain    Stacey [name] is a 78 y.o. female.  She is brought in by EMS from home after being awoken with chest pain and trouble breathing that started at 5:30 AM.  She said she took 3 nitro over the course of 30 minutes without improvement in her symptoms so called 911.  She said during transport her pain eased off and she is currently pain-free.  She said she has had 1 other episode of this before.  No recent illness.  She is on blood thinners for A-fib.  She is chronically bedbound and lives with her son.  Has an intrathecal pain pump.  History of breast cancer.  No fevers chills cough vomiting diarrhea diaphoresis.  The history is provided by the patient.  Chest Pain Pain location:  Substernal area Pain quality: aching   Pain severity:  Moderate Onset quality:  Sudden Duration:  1 hour Timing:  Constant Progression:  Resolved Ineffective treatments:  Nitroglycerin Associated symptoms: shortness of breath   Associated symptoms: no abdominal pain, no cough, no diaphoresis, no fever, no nausea and no vomiting        Home Medications Prior to Admission medications   Medication Sig Start Date End Date Taking? Authorizing Provider  acetaminophen (TYLENOL) 325 MG tablet Take 2 tablets (650 mg total) by mouth every 6 (six) hours as needed for mild pain, fever or headache. 04/05/21   Shon Hale, MD  amLODipine (NORVASC) 5 MG tablet TAKE 1 TABLET EVERY DAY 05/12/23   Branch, Dorothe Pea, MD  apixaban (ELIQUIS) 5 MG TABS tablet Take 5 mg by mouth 2 (two) times daily. 04/01/23   [provider]  atorvastatin (LIPITOR) 80 MG tablet TAKE 1 TABLET EVERY MORNING 08/05/23   Antoine Poche, MD  b complex vitamins tablet Take 1 tablet by mouth every morning. 07/05/20   Albertine Grates, MD   carboxymethylcellulose (REFRESH PLUS) 0.5 % SOLN Place 1 drop into both eyes as needed.    [provider]  clopidogrel (PLAVIX) 75 MG tablet Take 1 tablet (75 mg total) by mouth daily. 08/25/23   Antoine Poche, MD  DULoxetine (CYMBALTA) 30 MG capsule Take 3 capsules (90 mg total) by mouth daily. Patient taking differently: Take 30 mg by mouth 2 (two) times daily. 07/06/20   Albertine Grates, MD  ferrous sulfate 325 (65 FE) MG tablet Take 325 mg by mouth daily.    [provider]  HYDROmorphone (DILAUDID) 2 MG tablet Take 1 tablet (2 mg total) by mouth every 6 (six) hours as needed for severe pain (breakthrough pain). 02/26/23   Harris, Cammy Copa, PA-C  lidocaine (LIDODERM) 5 % Place 1 patch onto the skin daily. Remove & Discard patch within 12 hours or as directed by MD 12/07/23   Trisha Mangle, Tammy, PA-C  losartan (COZAAR) 100 MG tablet TAKE 1 TABLET EVERY DAY 11/02/23   Antoine Poche, MD  methadone (DOLOPHINE) 5 MG tablet Take 5 mg by mouth every 8 (eight) hours. 09/03/23   [provider]  methocarbamol (ROBAXIN) 500 MG tablet Take 1 tablet (500 mg total) by mouth 3 (three) times daily. 12/07/23   Triplett, Tammy, PA-C  metoprolol succinate (TOPROL-XL) 100 MG 24 hr tablet TAKE 1 AND 1/2 TABLETS EVERY DAY. DOSE INCREASE. 07/01/23   Branch,  Dorothe Pea, MD  Multiple Vitamin (MULTIVITAMIN ADULT PO) Take 1 tablet by mouth daily.    [provider]  Multiple Vitamins-Minerals (EQ VISION FORMULA 50+ PO) Take 1 tablet by mouth every morning.    [provider]  naloxone [facility]) nasal spray 4 mg/0.1 mL Place into the nose. 02/27/23   [provider]  nitroGLYCERIN (NITROSTAT) 0.4 MG SL tablet Place 1 tablet (0.4 mg total) under the tongue every 5 (five) minutes x 3 doses as needed for chest pain (if no relief after 3rd dose, proceed to the ED or call 911). 05/05/23   Antoine Poche, MD  oxyCODONE (OXY IR/ROXICODONE) 5 MG immediate release tablet Take by mouth.  01/07/23   [provider]  Oxycodone HCl 10 MG TABS Take 10 mg by mouth 3 (three) times daily as needed. 08/26/22   [provider]  PAIN MANAGEMENT INTRATHECAL, IT, PUMP by Intrathecal route. 02/21/21   [provider]  pregabalin (LYRICA) 50 MG capsule Take 50 mg by mouth daily as needed (pain). 01/26/21   [provider]      Allergies    Brilinta [ticagrelor], Budesonide, and Albuterol    Review of Systems   Review of Systems  Constitutional:  Negative for diaphoresis and fever.  Respiratory:  Positive for shortness of breath. Negative for cough.   Cardiovascular:  Positive for chest pain.  Gastrointestinal:  Negative for abdominal pain, nausea and vomiting.    Physical Exam Updated Vital Signs BP (!) 138/53   Pulse 80   Temp 99 F (37.2 C)   Resp 18   SpO2 91%  Physical Exam Vitals and nursing note reviewed.  Constitutional:      General: She is not in acute distress.    Appearance: She is well-developed.  HENT:     Head: Normocephalic and atraumatic.  Eyes:     Conjunctiva/sclera: Conjunctivae normal.  Cardiovascular:     Rate and Rhythm: Normal rate and regular rhythm.     Heart sounds: Normal heart sounds. No murmur heard. Pulmonary:     Effort: Pulmonary effort is normal. No respiratory distress.     Breath sounds: Normal breath sounds.  Abdominal:     Palpations: Abdomen is soft.     Tenderness: There is no abdominal tenderness.  Musculoskeletal:        General: No swelling.     Cervical back: Neck supple.     Right lower leg: No tenderness.     Left lower leg: No tenderness.  Skin:    General: Skin is warm and dry.     Capillary Refill: Capillary refill takes less than 2 seconds.  Neurological:     General: No focal deficit present.     Mental Status: She is alert and oriented to person, place, and time.     ED Results / Procedures / Treatments   Labs (all labs ordered are listed, but only abnormal results are  displayed) Labs Reviewed  BASIC METABOLIC PANEL - Abnormal; Notable for the following components:      Result Value   Glucose, Bld 135 (*)    All other components within normal limits  CBC - Abnormal; Notable for the following components:   RBC 3.32 (*)    Hemoglobin 11.0 (*)    HCT 34.4 (*)    MCV 103.6 (*)    All other components within normal limits  TROPONIN I (HIGH SENSITIVITY)  TROPONIN I (HIGH SENSITIVITY)    EKG None EKG  did not cross in epic.  Sinus rhythm normal rate left bundle branch pattern no significant change from prior Radiology DG Chest [facility] 1 View Result Date: 12/31/2023 CLINICAL DATA:  78 year old female with history of chest pain and shortness of breath. EXAM: PORTABLE CHEST 1 VIEW COMPARISON:  Chest x-ray 08/28/2023. FINDINGS: Lung volumes are normal. No consolidative airspace disease. No pleural effusions. No pneumothorax. No pulmonary nodule or mass noted. Pulmonary vasculature and the cardiomediastinal silhouette are within normal limits. Atherosclerosis in the thoracic aorta. IMPRESSION: 1.  No radiographic evidence of acute cardiopulmonary disease. 2. Aortic atherosclerosis. Electronically Signed   By: Trudie Reed M.D.   On: 12/31/2023 07:32    Procedures Procedures    Medications Ordered in ED Medications - No data to display  ED Course/ Medical Decision Making/ A&P Clinical Course as of 12/31/23 1749  Thu Dec 31, 2023  0724 Chest x-ray interpreted by me as cardiomegaly no gross infiltrate.  Awaiting radiology reading. [MB]  [phone] EKGs not crossing in epic.  Patient is in sinus rhythm with a normal rate, left bundle branch block pattern no significant change from prior [MB]  0959 Patient delta troponin flat and she is pain-free.  She is comfortable plan for discharge.  She will need ambulance to get her home.   [MB]    Clinical Course User Index [MB] Terrilee Files, MD                                 Medical Decision Making Amount and/or  Complexity of Data Reviewed Labs: ordered. Radiology: ordered.   This patient complains of chest pain shortness of breath; this involves an extensive number of treatment Options and is a complaint that carries with it a high risk of complications and morbidity. The differential includes ACS, pneumonia, pneumothorax, PE, musculoskeletal, reflux  I ordered, reviewed and interpreted labs, which included CBC with normal white count stable hemoglobin, chemistries mildly elevated glucose, troponins flat I ordered imaging studies which included chest x-ray and I independently    visualized and interpreted imaging which showed no acute findings Additional history obtained from EMS Previous records obtained and reviewed in epic including recent ED note for low back pain Cardiac monitoring reviewed, sinus rhythm Social determinants considered, patient physically inactive transportation insecurity inadequate health literacy Critical Interventions: None  After the interventions stated above, I reevaluated the patient and found Patient's pain-free and hemodynamically stable Admission and further testing considered, she feels better and no indications for admission.  Doubt PE as she has been compliant with her anticoagulation.  No indications for admission at this time.  Recommended close follow-up with her treatment team.  Return instructions discussed         Final Clinical Impression(s) / ED Diagnoses Final diagnoses:  Nonspecific chest pain    Rx / DC Orders ED Discharge Orders     None         Terrilee Files, MD 12/31/23 1752

## 2023-12-31 NOTE — ED Notes (Addendum)
Pt had brief and bedding change.

## 2023-12-31 NOTE — ED Notes (Signed)
[facility] has been called for transport

## 2024-01-01 ENCOUNTER — Ambulatory Visit (HOSPITAL_COMMUNITY)
Admission: RE | Admit: 2024-01-01 | Discharge: 2024-01-01 | Disposition: A | Discharge status: Home / Self Care | Payer: Medicare Other | Source: Ambulatory Visit | Attending: Hematology | Admitting: Hematology

## 2024-01-01 DIAGNOSIS — Z170 Estrogen receptor positive status [ER+]: Secondary | ICD-10-CM | POA: Diagnosis present

## 2024-01-01 DIAGNOSIS — C50911 Malignant neoplasm of unspecified site of right female breast: Secondary | ICD-10-CM | POA: Diagnosis present

## 2024-01-01 MED ORDER — IOHEXOL 300 MG/ML  SOLN
100.0000 mL | Freq: Once | INTRAMUSCULAR | Status: AC | PRN
  Administered 2024-01-01: 100 mL via INTRAVENOUS

## 2024-01-13 ENCOUNTER — Encounter: Payer: Self-pay | Admitting: Cardiology

## 2024-01-13 ENCOUNTER — Ambulatory Visit: Payer: Medicare Other | Attending: Cardiology | Admitting: Cardiology

## 2024-01-13 VITALS — BP 100/58 | HR 72 | Ht 60.0 in

## 2024-01-13 DIAGNOSIS — I2510 Atherosclerotic heart disease of native coronary artery without angina pectoris: Secondary | ICD-10-CM | POA: Insufficient documentation

## 2024-01-13 DIAGNOSIS — I5032 Chronic diastolic (congestive) heart failure: Secondary | ICD-10-CM | POA: Insufficient documentation

## 2024-01-13 DIAGNOSIS — Z79899 Other long term (current) drug therapy: Secondary | ICD-10-CM | POA: Diagnosis not present

## 2024-01-13 DIAGNOSIS — E782 Mixed hyperlipidemia: Secondary | ICD-10-CM | POA: Diagnosis present

## 2024-01-13 NOTE — Progress Notes (Signed)
Clinical Summary Stacey [name] is a 78 y.o.female seen today for follow up of the following medical problems.      1. Chronic systolic HF/ICM - Jan 2020 echo LVEF 20-25%, restrictive diastolic function, mild AS, PASP 40 - 10/2017 echo LVEF 50%, dropped to 30-35% Jan 2019 and has remained decreased   - chronic left leg edema thought to be venous insufficiency   From prior cardiologist: "She is not a candidate for device therapy given her metastatic cancer and numerous comorbidities and poor long-term prognosis. She has been scheduled to establish care with the palliative care team which I think is quite appropriate".  - has been hesitant to change to entresto due to concern about cost      03/2020 LVEF 35-40%   03/2021 echo LVEF 50-55%, grade I dd - no recent edema, no SOB/DOE  - compliant with meds   01/2023 echo [facility]: EF >55% - no SOB, no recent edema - compliant with meds     2. CAD - notes incidate history of DES to ramus, late stent thrombosis requiring PTCA in 2006. DES to RCA in Dec 2018 - SOB on brillinta, previously changed to plavix.    -denies any of chest pain   - Dec 31 2023 ER visit with chest pain, pounding like pain.  - trops neg x 2, EKG SR chronic LBBB - no recurrence     3. Mild aortic stenosis - Jan 2020 echo mean grad 11, AVA VTI 1.69 - 2021 mild to mod AS   03/2021 echo mild moderate AS: mean grad 10.5, DI 0.31, AVA VTI 1.09   01/2023 echo [facility]: reports no AS, however I cannot see any AV measurements included in the report.    4. Chronic LBBB     5. . Metastatic breast cancer/Chronic pain - followed at [facility]. Metastatic breast cancer to the bone and renal lesion, has pulmonary nodules. On radiation. - has been seen by palliative care previously - from 03/08/20 onc note increased size of renal lesion, to f/u with urology. Mention concern for additional primary such as renal cell CA.    - recent leg weakness, progressing back pain. Had PET scan  and MRI at [facility] within the last few weeks, she is waiting to hear back from neurosurgery     6. Afib - off coumadin for intrathecal pain pump through palliative pain management at [facility]   -with recent PE has been started on eliquis  - some palpitations 2months ago -    7. Recent lap chole 03/2021 - later required incarcerated umbilical hernia repair 03/2021     8. HTN - compliant with meds - home bps 100-120s/50s-60s     9. Chronic back pain - preop eval 01/2023 for back surgery   10.Pulmonary embolism - 03/2023 CT PE at [facility]: +PE -on eliquis, we have deferred duration to heme/onc. Immobile with metastatic cancer, would think likely indefinite anticoag Past Medical History:  Diagnosis Date   CAD in native artery    a. DES to ramus 2005 with late stent thrombosis 2006 tx with PTCA. 12/18 PCI/DES x1 to mRCA, EF 50-55%   CHF (congestive heart failure) (HCC)    Chronic pain    COPD (chronic obstructive pulmonary disease) (HCC)    Hyperlipidemia    Hypertension    Left bundle Azelea Seguin block    Lymphedema    Metastatic breast cancer    a. to bone.   MI (myocardial infarction) (HCC)  Mild aortic stenosis 10/2017   Morbid obesity (HCC)    PAF (paroxysmal atrial fibrillation) (HCC)    PSVT (paroxysmal supraventricular tachycardia) (HCC)    a. per [facility] notes, seen on event monitor in 2014.   Pulmonary nodules      Allergies  Allergen Reactions   Brilinta [Ticagrelor] Diarrhea and Nausea Only    Nausea and severe diarrhea- general weakness. Patient says does not want to take again   Budesonide Palpitations   Albuterol Other (See Comments)    Heart racing      Current Outpatient Medications  Medication Sig Dispense Refill   acetaminophen (TYLENOL) 325 MG tablet Take 2 tablets (650 mg total) by mouth every 6 (six) hours as needed for mild pain, fever or headache. 12 tablet 0   amLODipine (NORVASC) 5 MG tablet TAKE 1 TABLET EVERY DAY 90 tablet 3   apixaban (ELIQUIS) 5 MG  TABS tablet Take 5 mg by mouth 2 (two) times daily.     atorvastatin (LIPITOR) 80 MG tablet TAKE 1 TABLET EVERY MORNING 90 tablet 3   b complex vitamins tablet Take 1 tablet by mouth every morning. 30 tablet 0   carboxymethylcellulose (REFRESH PLUS) 0.5 % SOLN Place 1 drop into both eyes as needed.     clopidogrel (PLAVIX) 75 MG tablet Take 1 tablet (75 mg total) by mouth daily. 90 tablet 2   DULoxetine (CYMBALTA) 30 MG capsule Take 3 capsules (90 mg total) by mouth daily. (Patient taking differently: Take 30 mg by mouth 2 (two) times daily.) 30 capsule 0   ferrous sulfate 325 (65 FE) MG tablet Take 325 mg by mouth daily.     HYDROmorphone (DILAUDID) 2 MG tablet Take 1 tablet (2 mg total) by mouth every 6 (six) hours as needed for severe pain (breakthrough pain). 15 tablet 0   lidocaine (LIDODERM) 5 % Place 1 patch onto the skin daily. Remove & Discard patch within 12 hours or as directed by MD 30 patch 0   losartan (COZAAR) 100 MG tablet TAKE 1 TABLET EVERY DAY 90 tablet 0   methadone (DOLOPHINE) 5 MG tablet Take 5 mg by mouth every 8 (eight) hours.     methocarbamol (ROBAXIN) 500 MG tablet Take 1 tablet (500 mg total) by mouth 3 (three) times daily. 21 tablet 0   metoprolol succinate (TOPROL-XL) 100 MG 24 hr tablet TAKE 1 AND 1/2 TABLETS EVERY DAY. DOSE INCREASE. 135 tablet 3   Multiple Vitamin (MULTIVITAMIN ADULT PO) Take 1 tablet by mouth daily.     Multiple Vitamins-Minerals (EQ VISION FORMULA 50+ PO) Take 1 tablet by mouth every morning.     naloxone (NARCAN) nasal spray 4 mg/0.1 mL Place into the nose.     nitroGLYCERIN (NITROSTAT) 0.4 MG SL tablet Place 1 tablet (0.4 mg total) under the tongue every 5 (five) minutes x 3 doses as needed for chest pain (if no relief after 3rd dose, proceed to the ED or call 911). 25 tablet 3   oxyCODONE (OXY IR/ROXICODONE) 5 MG immediate release tablet Take by mouth.     Oxycodone HCl 10 MG TABS Take 10 mg by mouth 3 (three) times daily as needed.     PAIN  MANAGEMENT INTRATHECAL, IT, PUMP by Intrathecal route.     pregabalin (LYRICA) 50 MG capsule Take 50 mg by mouth daily as needed (pain).     No current facility-administered medications for this visit.     Past Surgical History:  Procedure Laterality Date  CHOLECYSTECTOMY N/A 03/29/2021   Procedure: LAPAROSCOPIC CHOLECYSTECTOMY;  Surgeon: Lucretia Roers, MD;  Location: [facility];  Service: General;  Laterality: N/A;   CORONARY STENT INTERVENTION N/A 11/26/2017   Procedure: CORONARY STENT INTERVENTION;  Surgeon: Swaziland, Peter M, MD;  Location: [facility] INVASIVE CV LAB;  Service: Cardiovascular;  Laterality: N/A;   CORONARY STENT PLACEMENT     ERCP N/A 03/28/2021   Procedure: ENDOSCOPIC RETROGRADE CHOLANGIOPANCREATOGRAPHY (ERCP);  Surgeon: Malissa Hippo, MD;  Location: [facility];  Service: Endoscopy;  Laterality: N/A;   intrathecal pain pump     LEFT HEART CATH AND CORONARY ANGIOGRAPHY N/A 11/26/2017   Procedure: LEFT HEART CATH AND CORONARY ANGIOGRAPHY;  Surgeon: Swaziland, Peter M, MD;  Location: [facility] INVASIVE CV LAB;  Service: Cardiovascular;  Laterality: N/A;   REMOVAL OF STONES  03/28/2021   Procedure: REMOVAL OF STONES;  Surgeon: Malissa Hippo, MD;  Location: [facility];  Service: Endoscopy;;   SPHINCTEROTOMY  03/28/2021   Procedure: SPHINCTEROTOMY;  Surgeon: Malissa Hippo, MD;  Location: [facility];  Service: Endoscopy;;   UMBILICAL HERNIA REPAIR N/A 04/03/2021   Procedure: ADULT PRIMARY UMBILICAL HERNIA REPAIR;  Surgeon: Lucretia Roers, MD;  Location: [facility];  Service: General;  Laterality: N/A;   VASCULAR SURGERY       Allergies  Allergen Reactions   Brilinta [Ticagrelor] Diarrhea and Nausea Only    Nausea and severe diarrhea- general weakness. Patient says does not want to take again   Budesonide Palpitations   Albuterol Other (See Comments)    Heart racing       Family History  Problem Relation Age of Onset   CAD Father 31   Heart attack Father    COPD Sister    CAD  Paternal Grandmother    Sudden Cardiac Death Neg Hx    Colon polyps Neg Hx    Colon cancer Neg Hx      Social History Ms. Faulks reports that she quit smoking about 43 years ago. Her smoking use included cigarettes. She started smoking about 61 years ago. She has a 36 pack-year smoking history. She has never used smokeless tobacco. Ms. Vahey reports no history of alcohol use.    Physical Examination Vitals:   01/13/24 1550  BP: (!) 100/58  Pulse: 72   Filed Weights    Gen: resting comfortably, no acute distress HEENT: no scleral icterus, pupils equal round and reactive, no palptable cervical adenopathy,  CV: RRR. No mrg, no jvd Resp: Clear to auscultation bilaterally GI: abdomen is soft, non-tender, non-distended, normal bowel sounds, no hepatosplenomegaly MSK: extremities are warm, no edema.  Skin: warm, no rash Neuro:  no focal deficits Psych: appropriate affect   Assessment and Plan   1. HFimpEF - LVEF has normalized -no symptoms, continue current meds   2. CAD - had been on extended DAPT due to history of late stent thrombosis - now that she is on eliquis would d/c ASA, continue plavix and eliquis.  - recent atypical chest pains with negative ER workup, monitor at this time. In general poor candidate for ischemic testing given metastatic cancer, bed bound severe lower back pain with pain pump   3. PAF -off coumadin since intrathecal pain pump placement at [facility] after discussions with pain clinic - she is back on anticoag however after PE, currently on eliquis -defer anticoag to her [facility] team, continue as long as ok from intrathecal pump standpoint.    4. HTN -at goal, continue current meds  5. Aortic stenosis - recent echo at [facility] reports no stenosis but no parameters are included in the study - from prior echos mild to mod AS, low than expected gradient give low SVI, paradoxical low flow stenosi - no indication for repeat study at this time. With malignant  cancer and immobility likely would not plan on ongoing surveillance at this time but can discuss over time.        Antoine Poche, M.D.

## 2024-01-13 NOTE — Patient Instructions (Addendum)
Medication Instructions:   Continue all current medications.   Labwork:  FLP - order given today - she will do at [facility]  Reminder:  Nothing to eat or drink after 12 midnight prior to labs. Office will contact with results via phone, letter or mychart.     Testing/Procedures:  none  Follow-Up:  6 months - can be a telephone call visit   Any Other Special Instructions Will Be Listed Below (If Applicable).  Please discuss Eliquis with MD at [facility]    If you need a refill on your cardiac medications before your next appointment, please call your pharmacy.

## 2024-01-26 ENCOUNTER — Other Ambulatory Visit: Payer: Self-pay

## 2024-01-26 ENCOUNTER — Inpatient Hospital Stay: Payer: Medicare Other | Admitting: Hematology

## 2024-01-26 ENCOUNTER — Inpatient Hospital Stay: Payer: Medicare Other

## 2024-01-26 DIAGNOSIS — C50911 Malignant neoplasm of unspecified site of right female breast: Secondary | ICD-10-CM

## 2024-01-26 DIAGNOSIS — D649 Anemia, unspecified: Secondary | ICD-10-CM

## 2024-01-26 NOTE — Progress Notes (Signed)
 [facility] [address] [place] [address]    Clinic Day:  01/27/24   Referring physician: Ignatius Specking, MD  Patient Care Team: Ignatius Specking, MD as PCP - General (Internal Medicine) Wyline Mood Dorothe Pea, MD as PCP - Cardiology (Cardiology) Therese Sarah, RN as Oncology Nurse Navigator (Oncology)   ASSESSMENT & PLAN:   Assessment: 1.  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 -Ultrasound-guided biopsy on 01/07/2016 showed grade 1 invasive ductal carcinoma, ER positive, PR positive, HER-2 equivocal by FISH, right axillary lymph node with metastatic adenocarcinoma.  HER-2 equivocal since her 2/CEP17 ratio less than 2 and average copy number of her 2 is greater than or equal to 4 but less than 6 signals per cell. -Bone biopsy on 05/30/2016 consistent with metastatic breast cancer. -Palliative therapy with letrozole started under the direction of Dr. Roland Earl at [facility] from 02/25/2016 through February 2020, progressive symptoms with increased soft tissue extension at L2. 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 -Addition of Ilda Foil was discussed but she was concerned about cost of the medication. -CT abdomen and pelvis on 08/13/2020 shows acute compression fracture deformity of the L1 vertebral body and other chronic findings. -Her last Faslodex injection at [facility] was on 11/06/2020. -CT CAP on 02/19/2021 with no evidence of for solid organ metastasis or nodal metastasis.  Right breast lesion is stable.  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 -Bone scan on 02/19/2021 was negative for bone mets.   2.  Social/family history: -She worked for [employer] prior to retirement in 2010. -She is  currently living with her son who is helping with her day-to-day activities.  Daughter lives in [place].  She quit smoking 35 to 40 years ago. -She is mostly confined to bed.  However she is able to change clothes, able to wash upper body, halfway dress herself.  She cannot stand up for the past 2 years because of weakness in the legs and knee arthritis.  She wears adult diapers.  Plan: 1.  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 - We reviewed CT CAP from 01/01/2024: Unchanged sclerotic bone mets at L1 and L2 with high-grade wedge deformity of L1 and retropulsion of fracture fragments.  Unchanged right breast mass.  No evidence of adenopathy or visible metastasis. - Reviewed labs today: Normal LFTs.  CBC grossly normal. - She will continue Faslodex monthly.  RTC 3 months with repeat tumor markers.  Will plan on repeat imaging in 6 months.     2.  Cancer associated pain/RLQ pain: - She has morphine/bupivacaine intrathecal pump.  She reports pain is not fully controlled.  She has follow-up at [facility] pain clinic on 02/25/2024. - She requests that we refer her to pain clinic locally.  Will make a referral to facility in [place].  3.  Left kidney upper pole mass: - Current scan shows the left upper pole kidney mass is stable at 2.7 x 2.6 cm.  4.  Bone metastasis: - She will continue monthly denosumab.  Calcium level is normal.  5.  Pulmonary embolism (Dx 03/16/2023): - CT scan done at [facility] showed nonocclusive pulmonary  embolism in the left upper lobe segmental artery.  She will continue Eliquis twice daily indefinitely.  Orders Placed This Encounter  Procedures   Comprehensive metabolic panel    Standing Status:   Standing    Number of Occurrences:   10    Expiration Date:   01/26/2025   CBC with Differential    Standing Status:   Standing    Number of Occurrences:   10    Expiration Date:   01/26/2025    Cancer antigen 27.29    Standing Status:   Standing    Number of Occurrences:   10    Expiration Date:   01/26/2025   Cancer antigen 15-3    Standing Status:   Standing    Number of Occurrences:   10    Expiration Date:   01/26/2025      [name],acting as a scribe for [name], MD.,have documented all relevant documentation on the behalf of [name], MD,as directed by  [name], MD while in the presence of [name], MD.  I, [name] MD, have reviewed the above documentation for accuracy and completeness, and I agree with the above.     [name], MD   2/26/20251:50 PM  CHIEF COMPLAINT:   Diagnosis: metastatic right breast cancer to the bones    Cancer Staging  Primary malignant neoplasm of breast with metastasis [facility]) Staging form: Breast, AJCC 8th Edition - Clinical stage from 02/07/2021: Stage IV (cT2, cN1, pM1, G1, ER+, PR+, HER2: Equivocal) - Signed by [name], MD on 02/07/2021    Prior Therapy:  Palliative therapy with letrozole started under the direction of Dr. Roland Earl at [facility] from 02/25/2016 through February 2020   Current Therapy:  She was transitioned to fulvestrant in February 2020- still receiving monthly Faslodex   HISTORY OF PRESENT ILLNESS:   Oncology History  Primary malignant neoplasm of breast with metastasis (HCC)  11/23/2017 Initial Diagnosis   Metastatic breast cancer (HCC)   02/07/2021 Cancer Staging   Staging form: Breast, AJCC 8th Edition - Clinical stage from 02/07/2021: Stage IV (cT2, cN1, pM1, G1, ER+, PR+, HER2: Equivocal) - Signed by [name], MD on 02/07/2021 Histopathologic type: Infiltrating duct carcinoma, NOS Stage prefix: Initial diagnosis Histologic grading system: 3 grade system   02/07/2021 - 02/07/2021 Chemotherapy            INTERVAL HISTORY:   Stacey [name] is a 78 y.o. female presenting to clinic today for follow up of  metastatic right breast cancer to the bones. She was last seen by me on 07/14/23.  Since her last visit, she underwent CT C/A/P on 01/01/24 that found: Unchanged sclerotic osseous metastases of L1 and L2, again with a high-grade wedge deformity of L1 and retropulsion of fracture fragments. Unchanged sclerotic osseous metastasis of the left inferior pubic ramus. Unchanged right breast mass. No evidence of lymphadenopathy or soft tissue metastatic disease in the chest, abdomen, or pelvis. Unchanged heterogeneously enhancing mass of the peripheral superior pole of the left kidney measuring 2.7 x 2.6 cm, consistent with incidental metachronous renal cell carcinoma. Unchanged small nodule of the posterior right apex again presumed benign scarring. Emphysema and diffuse bilateral bronchial wall thickening with background of fine centrilobular nodularity, consistent with smoking-related respiratory bronchiolitis. Coronary artery disease. Dense aortic valve calcifications.  [name] presented to the ED on 12/31/23 for chest pain.   Today, she states that she is doing well overall. Her appetite level is at 100%. Her energy  level is at 75%.   PAST MEDICAL HISTORY:   Past Medical History: Past Medical History:  Diagnosis Date   CAD in native artery    a. DES to ramus 2005 with late stent thrombosis 2006 tx with PTCA. 12/18 PCI/DES x1 to mRCA, EF 50-55%   CHF (congestive heart failure) (HCC)    Chronic pain    COPD (chronic obstructive pulmonary disease) (HCC)    Hyperlipidemia    Hypertension    Left bundle branch block    Lymphedema    Metastatic breast cancer    a. to bone.   MI (myocardial infarction) (HCC)    Mild aortic stenosis 10/2017   Morbid obesity (HCC)    PAF (paroxysmal atrial fibrillation) (HCC)    PSVT (paroxysmal supraventricular tachycardia) (HCC)    a. per [facility] notes, seen on event monitor in 2014.   Pulmonary nodules     Surgical History: Past Surgical History:  Procedure  Laterality Date   CHOLECYSTECTOMY N/A 03/29/2021   Procedure: LAPAROSCOPIC CHOLECYSTECTOMY;  Surgeon: Lucretia Roers, MD;  Location: [facility] ORS;  Service: General;  Laterality: N/A;   CORONARY STENT INTERVENTION N/A 11/26/2017   Procedure: CORONARY STENT INTERVENTION;  Surgeon: Swaziland, Peter M, MD;  Location: [facility] INVASIVE CV LAB;  Service: Cardiovascular;  Laterality: N/A;   CORONARY STENT PLACEMENT     ERCP N/A 03/28/2021   Procedure: ENDOSCOPIC RETROGRADE CHOLANGIOPANCREATOGRAPHY (ERCP);  Surgeon: Malissa Hippo, MD;  Location: [facility]S;  Service: Endoscopy;  Laterality: N/A;   intrathecal pain pump     LEFT HEART CATH AND CORONARY ANGIOGRAPHY N/A 11/26/2017   Procedure: LEFT HEART CATH AND CORONARY ANGIOGRAPHY;  Surgeon: Swaziland, Peter M, MD;  Location: [facility] INVASIVE CV LAB;  Service: Cardiovascular;  Laterality: N/A;   REMOVAL OF STONES  03/28/2021   Procedure: REMOVAL OF STONES;  Surgeon: Malissa Hippo, MD;  Location: [facility];  Service: Endoscopy;;   SPHINCTEROTOMY  03/28/2021   Procedure: SPHINCTEROTOMY;  Surgeon: Malissa Hippo, MD;  Location: [facility];  Service: Endoscopy;;   UMBILICAL HERNIA REPAIR N/A 04/03/2021   Procedure: ADULT PRIMARY UMBILICAL HERNIA REPAIR;  Surgeon: Lucretia Roers, MD;  Location: [facility];  Service: General;  Laterality: N/A;   VASCULAR SURGERY      Social History: Social History   Socioeconomic History   Marital status: Single    Spouse name: Not on file   Number of children: Not on file   Years of education: Not on file   Highest education level: Not on file  Occupational History   Occupation: Retired  Tobacco Use   Smoking status: Former    Current packs/day: 0.00    Average packs/day: 2.0 packs/day for 18.0 years (36.0 ttl pk-yrs)    Types: Cigarettes    Start date: 12/01/1962    Quit date: 12/01/1980    Years since quitting: 43.1   Smokeless tobacco: Never  Vaping Use   Vaping status: Never Used  Substance and Sexual Activity   Alcohol  use: No   Drug use: Yes    Types: Oxycodone, Morphine    Comment: takes methadone   Sexual activity: Not Currently  Other Topics Concern   Not on file  Social History Narrative   Not on file   Social Drivers of Health   Financial Resource Strain: High Risk (01/04/2024)   Received from [facility]   Overall Financial Resource Strain (CARDIA)    Difficulty of Paying Living Expenses: Hard  Food Insecurity:  No Food Insecurity (01/04/2024)   Received from [facility]   Hunger Vital Sign    Worried About Running Out of Food in the Last Year: Never true    Ran Out of Food in the Last Year: Never true  Transportation Needs: Unmet Transportation Needs (01/04/2024)   Received from [facility] - Transportation    In the past 12 months, has lack of transportation kept you from medical appointments or from getting medications?: Yes    Lack of Transportation (Non-Medical): Yes  Physical Activity: Inactive (08/17/2023)   Received from [facility]   Exercise Vital Sign    Days of Exercise per Week: 0 days    Minutes of Exercise per Session: 0 min  Stress: No Stress Concern Present (08/17/2023)   Received from [facility] - Occupational Stress Questionnaire    Feeling of Stress : Only a little  Social Connections: Moderately Isolated (08/17/2023)   Received from [facility]   Social Connection and Isolation Panel [NHANES]    Frequency of Communication with Friends and Family: Three times a week    Frequency of Social Gatherings with Friends and Family: Three times a week    Attends Religious Services: More than 4 times per year    Active Member of Clubs or Organizations: No    Attends Banker Meetings: Never    Marital Status: Divorced  Catering manager Violence: Not At Risk (02/07/2021)   Humiliation, Afraid, Rape, and Kick  questionnaire    Fear of Current or Ex-Partner: No    Emotionally Abused: No    Physically Abused: No    Sexually Abused: No    Family History: Family History  Problem Relation Age of Onset   CAD Father 55   Heart attack Father    COPD Sister    CAD Paternal Grandmother    Sudden Cardiac Death Neg Hx    Colon polyps Neg Hx    Colon cancer Neg Hx     Current Medications:  Current Outpatient Medications:    acetaminophen (TYLENOL) 325 MG tablet, Take 2 tablets (650 mg total) by mouth every 6 (six) hours as needed for mild pain, fever or headache., Disp: 12 tablet, Rfl: 0   amLODipine (NORVASC) 5 MG tablet, TAKE 1 TABLET EVERY DAY, Disp: 90 tablet, Rfl: 3   apixaban (ELIQUIS) 5 MG TABS tablet, Take 5 mg by mouth 2 (two) times daily., Disp: , Rfl:    atorvastatin (LIPITOR) 80 MG tablet, TAKE 1 TABLET EVERY MORNING, Disp: 90 tablet, Rfl: 3   b complex vitamins tablet, Take 1 tablet by mouth every morning., Disp: 30 tablet, Rfl: 0   carboxymethylcellulose (REFRESH PLUS) 0.5 % SOLN, Place 1 drop into both eyes as needed., Disp: , Rfl:    clopidogrel (PLAVIX) 75 MG tablet, Take 1 tablet (75 mg total) by mouth daily., Disp: 90 tablet, Rfl: 2   DULoxetine (CYMBALTA) 30 MG capsule, Take 3 capsules (90 mg total) by mouth daily. (Patient taking differently: Take 30 mg by mouth 2 (two) times daily.), Disp: 30 capsule, Rfl: 0   ferrous sulfate 325 (65 FE) MG tablet, Take 325 mg by mouth daily., Disp: , Rfl:    HYDROmorphone (DILAUDID) 2 MG tablet, Take 1 tablet (2 mg total) by mouth every 6 (six) hours as needed for severe pain (breakthrough pain)., Disp: 15 tablet, Rfl: 0   lidocaine (  LIDODERM) 5 %, Place 1 patch onto the skin daily. Remove & Discard patch within 12 hours or as directed by MD, Disp: 30 patch, Rfl: 0   losartan (COZAAR) 100 MG tablet, TAKE 1 TABLET EVERY DAY, Disp: 90 tablet, Rfl: 0   methadone (DOLOPHINE) 5 MG tablet, Take 5 mg by mouth every 8 (eight) hours., Disp: , Rfl:     methocarbamol (ROBAXIN) 500 MG tablet, Take 1 tablet (500 mg total) by mouth 3 (three) times daily., Disp: 21 tablet, Rfl: 0   metoprolol succinate (TOPROL-XL) 100 MG 24 hr tablet, TAKE 1 AND 1/2 TABLETS EVERY DAY. DOSE INCREASE., Disp: 135 tablet, Rfl: 3   Multiple Vitamin (MULTIVITAMIN ADULT PO), Take 1 tablet by mouth daily., Disp: , Rfl:    Multiple Vitamins-Minerals (EQ VISION FORMULA 50+ PO), Take 1 tablet by mouth every morning., Disp: , Rfl:    naloxone (NARCAN) nasal spray 4 mg/0.1 mL, Place into the nose., Disp: , Rfl:    nitroGLYCERIN (NITROSTAT) 0.4 MG SL tablet, Place 1 tablet (0.4 mg total) under the tongue every 5 (five) minutes x 3 doses as needed for chest pain (if no relief after 3rd dose, proceed to the ED or call 911)., Disp: 25 tablet, Rfl: 3   oxyCODONE (OXY IR/ROXICODONE) 5 MG immediate release tablet, Take by mouth., Disp: , Rfl:    Oxycodone HCl 10 MG TABS, Take 10 mg by mouth 3 (three) times daily as needed., Disp: , Rfl:    PAIN MANAGEMENT INTRATHECAL, IT, PUMP, by Intrathecal route., Disp: , Rfl:    pregabalin (LYRICA) 50 MG capsule, Take 50 mg by mouth daily as needed (pain)., Disp: , Rfl:  No current facility-administered medications for this visit.  Facility-Administered Medications Ordered in Other Visits:    denosumab (XGEVA) injection 120 mg, 120 mg, Subcutaneous, Once, [name], MD   Allergies: Allergies  Allergen Reactions   Brilinta [Ticagrelor] Diarrhea and Nausea Only    Nausea and severe diarrhea- general weakness. Patient says does not want to take again   Budesonide Palpitations   Albuterol Other (See Comments)    Heart racing     REVIEW OF SYSTEMS:   Review of Systems  Constitutional:  Negative for chills, fatigue and fever.  HENT:   Negative for lump/mass, mouth sores, nosebleeds, sore throat and trouble swallowing.   Eyes:  Negative for eye problems.  Respiratory:  Negative for cough and shortness of breath.   Cardiovascular:   Negative for chest pain, leg swelling and palpitations.  Gastrointestinal:  Positive for constipation. Negative for abdominal pain, diarrhea, nausea and vomiting.  Genitourinary:  Negative for bladder incontinence, difficulty urinating, dysuria, frequency, hematuria and nocturia.   Musculoskeletal:  Positive for back pain. Negative for arthralgias, flank pain, myalgias and neck pain.  Skin:  Negative for itching and rash.  Neurological:  Negative for dizziness, headaches and numbness.  Hematological:  Does not bruise/bleed easily.  Psychiatric/Behavioral:  Negative for depression, sleep disturbance and suicidal ideas. The patient is not nervous/anxious.   All other systems reviewed and are negative.    VITALS:   There were no vitals taken for this visit.  Wt Readings from Last 3 Encounters:  12/07/23 190 lb 14.7 oz (86.6 kg)  08/28/23 191 lb (86.6 kg)  04/07/23 215 lb (97.5 kg)    There is no height or weight on file to calculate BMI.  Performance status (ECOG): 4 - Bedbound  PHYSICAL EXAM:   Physical Exam Vitals and nursing note reviewed. Exam conducted  with a chaperone present.  Constitutional:      Appearance: Normal appearance.  Cardiovascular:     Rate and Rhythm: Normal rate and regular rhythm.     Pulses: Normal pulses.     Heart sounds: Normal heart sounds.  Pulmonary:     Effort: Pulmonary effort is normal.     Breath sounds: Normal breath sounds.  Abdominal:     Palpations: Abdomen is soft. There is no hepatomegaly, splenomegaly or mass.     Tenderness: There is no abdominal tenderness.  Musculoskeletal:     Right lower leg: No edema.     Left lower leg: No edema.  Lymphadenopathy:     Cervical: No cervical adenopathy.     Right cervical: No superficial, deep or posterior cervical adenopathy.    Left cervical: No superficial, deep or posterior cervical adenopathy.     Upper Body:     Right upper body: No supraclavicular or axillary adenopathy.     Left upper  body: No supraclavicular or axillary adenopathy.  Neurological:     General: No focal deficit present.     Mental Status: She is alert and oriented to person, place, and time.  Psychiatric:        Mood and Affect: Mood normal.        Behavior: Behavior normal.    Breast Exam Chaperone: Anne Fu, LPN   LABS:      Latest Ref Rng & Units 01/27/2024   11:30 AM 12/31/2023    7:10 AM 12/29/2023    2:07 PM  CBC  WBC 4.0 - 10.5 K/uL 6.3  6.7  4.8   Hemoglobin 12.0 - 15.0 g/dL 16.1  09.6  04.5   Hematocrit 36.0 - 46.0 % 31.6  34.4  32.6   Platelets 150 - 400 K/uL 210  180  181       Latest Ref Rng & Units 01/27/2024   11:30 AM 12/31/2023    7:10 AM 12/29/2023    2:07 PM  CMP  Glucose 70 - 99 mg/dL 409  811  914   BUN 8 - 23 mg/dL 15  17  14    Creatinine 0.44 - 1.00 mg/dL 7.82  9.56  2.13   Sodium 135 - 145 mmol/L 139  137  140   Potassium 3.5 - 5.1 mmol/L 5.2  3.7  4.8   Chloride 98 - 111 mmol/L 99  102  103   CO2 22 - 32 mmol/L 33  29  30   Calcium 8.9 - 10.3 mg/dL 9.3  8.9  9.1   Total Protein 6.5 - 8.1 g/dL 6.3   5.9   Total Bilirubin 0.0 - 1.2 mg/dL 0.9   0.9   Alkaline Phos 38 - 126 U/L 147   174   AST 15 - 41 U/L 29   30   ALT 0 - 44 U/L 27   41    Component     Latest Ref Rng 09/09/2022 12/08/2022 03/02/2023  CA 15-3     0.0 - 25.0 U/mL 18.0  19.8  18.0     Component     Latest Ref Rng 09/09/2022 12/08/2022 03/02/2023  CA 27.29     0.0 - 38.6 U/mL 21.2  17.9  15.3     No results found for: "CEA1", "CEA" / No results found for: "CEA1", "CEA" No results found for: "PSA1" No results found for: "YQM578" No results found for: "CAN125"  No results found for: "TOTALPROTELP", "ALBUMINELP", "A1GS", "  A2GS", "BETS", "BETA2SER", "GAMS", "MSPIKE", "SPEI" Lab Results  Component Value Date   TIBC 273 09/09/2022   TIBC 289 06/09/2022   TIBC 274 10/01/2021   FERRITIN 47 09/09/2022   FERRITIN 36 06/09/2022   FERRITIN 67 10/01/2021   IRONPCTSAT 25 09/09/2022   IRONPCTSAT 23  06/09/2022   IRONPCTSAT 20 10/01/2021   No results found for: "LDH"   STUDIES:   CT CHEST ABDOMEN PELVIS W CONTRAST Result Date: 01/10/2024 CLINICAL DATA:  Metastatic breast cancer, assess treatment response * Tracking Code: BO * EXAM: CT CHEST, ABDOMEN, AND PELVIS WITH CONTRAST TECHNIQUE: Multidetector CT imaging of the chest, abdomen and pelvis was performed following the standard protocol during bolus administration of intravenous contrast. RADIATION DOSE REDUCTION: This exam was performed according to the departmental dose-optimization program which includes automated exposure control, adjustment of the mA and/or kV according to patient size and/or use of iterative reconstruction technique. CONTRAST:  OMNIPAQUE IOHEXOL 300 MG/ML  SOLN COMPARISON:  CT abdomen pelvis, 12/07/2023, CT chest abdomen pelvis, 06/22/2023 FINDINGS: CT CHEST FINDINGS Cardiovascular: Aortic atherosclerosis. Dense aortic valve calcifications. Normal heart size. Three-vessel coronary artery calcifications no pericardial effusion. Mediastinum/Nodes: No enlarged mediastinal, hilar, or axillary lymph nodes. Thyroid gland, trachea, and esophagus demonstrate no significant findings. Lungs/Pleura: Mild centrilobular emphysema. Diffuse bilateral bronchial wall thickening and background of fine centrilobular nodularity. Unchanged spiculated nodule of the posterior right apex measuring 0.6 cm (series 3, image 32). No pleural effusion or pneumothorax. Musculoskeletal: Calcified mass in the upper outer right breast (series 2, image 25). No acute osseous findings. CT ABDOMEN PELVIS FINDINGS Hepatobiliary: No focal liver abnormality is seen. Status post cholecystectomy. Unchanged postoperative pneumobilia. Pancreas: Unremarkable. No pancreatic ductal dilatation or surrounding inflammatory changes. Spleen: Normal in size without significant abnormality. Adrenals/Urinary Tract: Adrenal glands are unremarkable. Unchanged heterogeneously  enhancing mass of the peripheral superior pole of the left kidney measuring 2.7 x 2.6 cm (series 2, image 64). Right kidney is normal, without renal calculi, solid lesion, or hydronephrosis. Bladder is unremarkable. Stomach/Bowel: Stomach is within normal limits. Appendix appears normal. No evidence of bowel wall thickening, distention, or inflammatory changes. Large burden of stool and stool balls throughout the colon and rectum. Vascular/Lymphatic: Severe aortic atherosclerosis. 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 No enlarged abdominal or pelvic lymph nodes. Reproductive: Status post hysterectomy. Other: No abdominal wall hernia or abnormality. Infusion pump over the right lower quadrant. No ascites. Musculoskeletal: No acute osseous findings. Unchanged sclerotic osseous metastases of L1 and L2, again with a high-grade wedge deformity of L1 and retropulsion of fracture fragments (series 5, image 109). Unchanged sclerotic osseous metastasis of the left inferior pubic ramus (series 2, image 108). IMPRESSION: 1. Unchanged sclerotic osseous metastases of L1 and L2, again with a high-grade wedge deformity of L1 and retropulsion of fracture fragments. Unchanged sclerotic osseous metastasis of the left inferior pubic ramus. 2. Unchanged right breast mass. 3. No evidence of lymphadenopathy or soft tissue metastatic disease in the chest, abdomen, or pelvis. 4. Unchanged heterogeneously enhancing mass of the peripheral superior pole of the left kidney measuring 2.7 x 2.6 cm, consistent with incidental metachronous renal cell carcinoma. 5. Unchanged small nodule of the posterior right apex again presumed benign scarring. Attention on follow-up. 6. Emphysema and diffuse bilateral bronchial wall thickening with background of fine centrilobular nodularity, consistent with smoking-related respiratory bronchiolitis. 7. Coronary artery disease. 8. Dense aortic valve  calcifications. Correlate for echocardiographic evidence of aortic valve dysfunction. Aortic Atherosclerosis (ICD10-I70.0)  and Emphysema (ICD10-J43.9). Electronically Signed   By: Jearld Lesch M.D.   On: 01/10/2024 17:08   DG Chest Port 1 View Result Date: 12/31/2023 CLINICAL DATA:  78 year old female with history of chest pain and shortness of breath. EXAM: PORTABLE CHEST 1 VIEW COMPARISON:  Chest x-ray 08/28/2023. FINDINGS: Lung volumes are normal. No consolidative airspace disease. No pleural effusions. No pneumothorax. No pulmonary nodule or mass noted. Pulmonary vasculature and the cardiomediastinal silhouette are within normal limits. Atherosclerosis in the thoracic aorta. IMPRESSION: 1.  No radiographic evidence of acute cardiopulmonary disease. 2. Aortic atherosclerosis. Electronically Signed   By: Trudie Reed M.D.   On: 12/31/2023 07:32

## 2024-01-27 ENCOUNTER — Inpatient Hospital Stay: Payer: Medicare Other

## 2024-01-27 ENCOUNTER — Inpatient Hospital Stay: Payer: Medicare Other | Attending: Hematology

## 2024-01-27 ENCOUNTER — Inpatient Hospital Stay (HOSPITAL_BASED_OUTPATIENT_CLINIC_OR_DEPARTMENT_OTHER): Payer: Medicare Other | Admitting: Hematology

## 2024-01-27 VITALS — BP 133/62 | HR 76 | Temp 97.9°F | Resp 18

## 2024-01-27 DIAGNOSIS — D649 Anemia, unspecified: Secondary | ICD-10-CM

## 2024-01-27 DIAGNOSIS — C50911 Malignant neoplasm of unspecified site of right female breast: Secondary | ICD-10-CM

## 2024-01-27 DIAGNOSIS — C50411 Malignant neoplasm of upper-outer quadrant of right female breast: Secondary | ICD-10-CM | POA: Diagnosis present

## 2024-01-27 DIAGNOSIS — C773 Secondary and unspecified malignant neoplasm of axilla and upper limb lymph nodes: Secondary | ICD-10-CM | POA: Insufficient documentation

## 2024-01-27 DIAGNOSIS — Z1732 Human epidermal growth factor receptor 2 negative status: Secondary | ICD-10-CM | POA: Insufficient documentation

## 2024-01-27 DIAGNOSIS — Z1721 Progesterone receptor positive status: Secondary | ICD-10-CM | POA: Diagnosis not present

## 2024-01-27 DIAGNOSIS — C7951 Secondary malignant neoplasm of bone: Secondary | ICD-10-CM | POA: Diagnosis present

## 2024-01-27 DIAGNOSIS — Z170 Estrogen receptor positive status [ER+]: Secondary | ICD-10-CM | POA: Insufficient documentation

## 2024-01-27 DIAGNOSIS — Z5111 Encounter for antineoplastic chemotherapy: Secondary | ICD-10-CM | POA: Insufficient documentation

## 2024-01-27 LAB — CBC WITH DIFFERENTIAL/PLATELET
Abs Immature Granulocytes: 0.04 10*3/uL (ref 0.00–0.07)
Basophils Absolute: 0 10*3/uL (ref 0.0–0.1)
Basophils Relative: 0 %
Eosinophils Absolute: 0.2 10*3/uL (ref 0.0–0.5)
Eosinophils Relative: 3 %
HCT: 31.6 % — ABNORMAL LOW (ref 36.0–46.0)
Hemoglobin: 10.2 g/dL — ABNORMAL LOW (ref 12.0–15.0)
Immature Granulocytes: 1 %
Lymphocytes Relative: 16 %
Lymphs Abs: 1 10*3/uL (ref 0.7–4.0)
MCH: 32.5 pg (ref 26.0–34.0)
MCHC: 32.3 g/dL (ref 30.0–36.0)
MCV: 100.6 fL — ABNORMAL HIGH (ref 80.0–100.0)
Monocytes Absolute: 0.4 10*3/uL (ref 0.1–1.0)
Monocytes Relative: 7 %
Neutro Abs: 4.6 10*3/uL (ref 1.7–7.7)
Neutrophils Relative %: 73 %
Platelets: 210 10*3/uL (ref 150–400)
RBC: 3.14 MIL/uL — ABNORMAL LOW (ref 3.87–5.11)
RDW: 13.6 % (ref 11.5–15.5)
WBC: 6.3 10*3/uL (ref 4.0–10.5)
nRBC: 0 % (ref 0.0–0.2)

## 2024-01-27 LAB — COMPREHENSIVE METABOLIC PANEL
ALT: 27 U/L (ref 0–44)
AST: 29 U/L (ref 15–41)
Albumin: 2.5 g/dL — ABNORMAL LOW (ref 3.5–5.0)
Alkaline Phosphatase: 147 U/L — ABNORMAL HIGH (ref 38–126)
Anion gap: 7 (ref 5–15)
BUN: 15 mg/dL (ref 8–23)
CO2: 33 mmol/L — ABNORMAL HIGH (ref 22–32)
Calcium: 9.3 mg/dL (ref 8.9–10.3)
Chloride: 99 mmol/L (ref 98–111)
Creatinine, Ser: 0.65 mg/dL (ref 0.44–1.00)
GFR, Estimated: >60 mL/min (ref >60)
Glucose, Bld: 141 mg/dL — ABNORMAL HIGH (ref 70–99)
Potassium: 5.2 mmol/L — ABNORMAL HIGH (ref 3.5–5.1)
Sodium: 139 mmol/L (ref 135–145)
Total Bilirubin: 0.9 mg/dL (ref 0.0–1.2)
Total Protein: 6.3 g/dL — ABNORMAL LOW (ref 6.5–8.1)

## 2024-01-27 MED ORDER — DENOSUMAB 120 MG/1.7ML ~~LOC~~ SOLN
120.0000 mg | Freq: Once | SUBCUTANEOUS | Status: AC
  Administered 2024-01-27: 120 mg via SUBCUTANEOUS
  Filled 2024-01-27: qty 1.7

## 2024-01-27 MED ORDER — FULVESTRANT 250 MG/5ML IM SOSY
500.0000 mg | PREFILLED_SYRINGE | Freq: Once | INTRAMUSCULAR | Status: AC
  Administered 2024-01-27: 500 mg via INTRAMUSCULAR
  Filled 2024-01-27: qty 10

## 2024-01-27 NOTE — Patient Instructions (Signed)
 Denosumab Injection (Oncology) What is this medication? DENOSUMAB (den oh SUE mab) prevents weakened bones caused by cancer. It may also be used to treat noncancerous bone tumors that cannot be removed by surgery. It can also be used to treat high calcium levels in the blood caused by cancer. It works by blocking a protein that causes bones to break down quickly. This slows down the release of calcium from bones, which lowers calcium levels in your blood. It also makes your bones stronger and less likely to break (fracture). This medicine may be used for other purposes; ask your health care provider or pharmacist if you have questions. COMMON BRAND NAME(S): XGEVA What should I tell my care team before I take this medication? They need to know if you have any of these conditions: Dental disease Having surgery or tooth extraction Infection Kidney disease Low levels of calcium or vitamin D in the blood Malnutrition On hemodialysis Skin conditions or sensitivity Thyroid or parathyroid disease An unusual reaction to denosumab, other medications, foods, dyes, or preservatives Pregnant or trying to get pregnant Breast-feeding How should I use this medication? This medication is for injection under the skin. It is given by your care team in a hospital or clinic setting. A special MedGuide will be given to you before each treatment. Be sure to read this information carefully each time. Talk to your care team about the use of this medication in children. While it may be prescribed for children as young as 13 years for selected conditions, precautions do apply. Overdosage: If you think you have taken too much of this medicine contact a poison control center or emergency room at once. NOTE: This medicine is only for you. Do not share this medicine with others. What if I miss a dose? Keep appointments for follow-up doses. It is important not to miss your dose. Call your care team if you are unable to  keep an appointment. What may interact with this medication? Do not take this medication with any of the following: Other medications containing denosumab This medication may also interact with the following: Medications that lower your chance of fighting infection Steroid medications, such as prednisone or cortisone This list may not describe all possible interactions. Give your health care provider a list of all the medicines, herbs, non-prescription drugs, or dietary supplements you use. Also tell them if you smoke, drink alcohol, or use illegal drugs. Some items may interact with your medicine. What should I watch for while using this medication? Your condition will be monitored carefully while you are receiving this medication. You may need blood work while taking this medication. This medication may increase your risk of getting an infection. Call your care team for advice if you get a fever, chills, sore throat, or other symptoms of a cold or flu. Do not treat yourself. Try to avoid being around people who are sick. You should make sure you get enough calcium and vitamin D while you are taking this medication, unless your care team tells you not to. Discuss the foods you eat and the vitamins you take with your care team. Some people who take this medication have severe bone, joint, or muscle pain. This medication may also increase your risk for jaw problems or a broken thigh bone. Tell your care team right away if you have severe pain in your jaw, bones, joints, or muscles. Tell your care team if you have any pain that does not go away or that gets worse. Talk  to your care team if you may be pregnant. Serious birth defects can occur if you take this medication during pregnancy and for 5 months after the last dose. You will need a negative pregnancy test before starting this medication. Contraception is recommended while taking this medication and for 5 months after the last dose. Your care team  can help you find the option that works for you. What side effects may I notice from receiving this medication? Side effects that you should report to your care team as soon as possible: Allergic reactions--skin rash, itching, hives, swelling of the face, lips, tongue, or throat Bone, joint, or muscle pain Low calcium level--muscle pain or cramps, confusion, tingling, or numbness in the hands or feet Osteonecrosis of the jaw--pain, swelling, or redness in the mouth, numbness of the jaw, poor healing after dental work, unusual discharge from the mouth, visible bones in the mouth Side effects that usually do not require medical attention (report to your care team if they continue or are bothersome): Cough Diarrhea Fatigue Headache Nausea This list may not describe all possible side effects. Call your doctor for medical advice about side effects. You may report side effects to FDA at 1-800-FDA-1088. Where should I keep my medication? This medication is given in a hospital or clinic. It will not be stored at home. NOTE: This sheet is a summary. It may not cover all possible information. If you have questions about this medicine, talk to your doctor, pharmacist, or health care provider.  2024 Elsevier/Gold Standard (2022-04-09 00:00:00)Fulvestrant Injection What is this medication? FULVESTRANT (ful VES trant) treats breast cancer. It works by blocking the hormone estrogen in breast tissue, which prevents breast cancer cells from spreading or growing. This medicine may be used for other purposes; ask your health care provider or pharmacist if you have questions. COMMON BRAND NAME(S): FASLODEX What should I tell my care team before I take this medication? They need to know if you have any of these conditions: Bleeding disorder Liver disease Low blood cell levels (white cells, red cells, and platelets) An unusual or allergic reaction to fulvestrant, other medications, foods, dyes, or  preservatives Pregnant or trying to get pregnant Breastfeeding How should I use this medication? This medication is injected into a muscle. It is given by your care team in a hospital or clinic setting. Talk to your care team about the use of this medication in children. Special care may be needed. Overdosage: If you think you have taken too much of this medicine contact a poison control center or emergency room at once. NOTE: This medicine is only for you. Do not share this medicine with others. What if I miss a dose? Keep appointments for follow-up doses. It is important not to miss your dose. Call your care team if you are unable to keep an appointment. What may interact with this medication? Fluoroestradiol F18 This list may not describe all possible interactions. Give your health care provider a list of all the medicines, herbs, non-prescription drugs, or dietary supplements you use. Also tell them if you smoke, drink alcohol, or use illegal drugs. Some items may interact with your medicine. What should I watch for while using this medication? Your condition will be monitored carefully while you are receiving this medication. You may need blood work done while you are taking this medication. This medication is injected into a muscle. Talk to your care team if you also take medications that prevent or treat blood clots, such as warfarin.  Blood thinners may increase the risk of bleeding or bruising in the muscle where this medication is injected. The benefits of this medication may outweigh the risks. Your care team can help you find the option that works for you. They can also help limit the risk of bleeding. Talk to your care team if you may be pregnant. Serious birth defects can occur if you take this medication during pregnancy and for 1 year after the last dose. You will need a negative pregnancy test before starting this medication. Contraception is recommended while taking this medication  and for 1 year after the last dose. Your care team can help you find the option that works for you. Do not breastfeed while taking this medication and for 1 year after the last dose. This medication may cause infertility. Talk to your care team if you are concerned about your fertility. What side effects may I notice from receiving this medication? Side effects that you should report to your care team as soon as possible: Allergic reactions or angioedema--skin rash, itching or hives, swelling of the face, eyes, lips, tongue, arms, or legs, trouble swallowing or breathing Pain, tingling, or numbness in the hands or feet Side effects that usually do not require medical attention (report to your care team if they continue or are bothersome): Bone, joint, or muscle pain Constipation Headache Hot flashes Nausea Pain, redness, or irritation at injection site Unusual weakness or fatigue This list may not describe all possible side effects. Call your doctor for medical advice about side effects. You may report side effects to FDA at 1-800-FDA-1088. Where should I keep my medication? This medication is given in a hospital or clinic. It will not be stored at home. NOTE: This sheet is a summary. It may not cover all possible information. If you have questions about this medicine, talk to your doctor, pharmacist, or health care provider.  2024 Elsevier/Gold Standard (2023-07-24 00:00:00)

## 2024-01-27 NOTE — Patient Instructions (Signed)
 [facility] at [facility] Discharge Instructions   You were seen and examined today by Dr. Ellin Saba.  He reviewed the results of your lab work which are normal/stable.   He reviewed the results of your CT scan which is stable. It does not show that the cancer in your breast or bones has grown or spread.   We will proceed with your injections today.   Return as scheduled.    Thank you for choosing [facility] at [facility] to provide your oncology and hematology care.  To afford each patient quality time with our provider, please arrive at least 15 minutes before your scheduled appointment time.   If you have a lab appointment with the Cancer Center please come in thru the Main Entrance and check in at the main information desk.  You need to re-schedule your appointment should you arrive 10 or more minutes late.  We strive to give you quality time with our providers, and arriving late affects you and other patients whose appointments are after yours.  Also, if you no show three or more times for appointments you may be dismissed from the clinic at the providers discretion.     Again, thank you for choosing [facility].  Our hope is that these requests will decrease the amount of time that you wait before being seen by our physicians.       _____________________________________________________________  Should you have questions after your visit to [facility], please contact our office at [phone] and follow the prompts.  Our office hours are 8:00 a.m. and 4:30 p.m. Monday - Friday.  Please note that voicemails left after 4:00 p.m. may not be returned until the following business day.  We are closed weekends and major holidays.  You do have access to a nurse 24-7, just call the main number to the clinic [phone] and do not press any options, hold on the line and a nurse will answer the phone.    For  prescription refill requests, have your pharmacy contact our office and allow 72 hours.    Due to Covid, you will need to wear a mask upon entering the hospital. If you do not have a mask, a mask will be given to you at the Main Entrance upon arrival. For doctor visits, patients may have 1 support person age 52 or older with them. For treatment visits, patients can not have anyone with them due to social distancing guidelines and our immunocompromised population.

## 2024-01-27 NOTE — Progress Notes (Signed)
 Labs reviewed with MD and treatment team. Per MD to proceed with  injections. Patient tolerated Faslodex and Xgeva  injection with no complaints voiced.  Site clean and dry with no bruising or swelling noted at site.  See MAR for details.  Band aid applied.  Patient stable during and after injection.  Vss with discharge and left in satisfactory condition with no s/s of distress noted. EMS transport called for patient's transportation needs. All follow ups as scheduled.   Stacey [name] [employer]

## 2024-02-05 ENCOUNTER — Telehealth: Payer: Self-pay | Admitting: Cardiology

## 2024-02-05 NOTE — Telephone Encounter (Signed)
 Patient calling the office for samples of medication:   1.  What medication and dosage are you requesting samples for? Eliquis   2.  Are you currently out of this medication?   Almost.   only has a couple days

## 2024-02-08 MED ORDER — APIXABAN 5 MG PO TABS: 5.0000 mg | ORAL_TABLET | Freq: Two times a day (BID) | ORAL | Status: DC

## 2024-02-08 NOTE — Telephone Encounter (Signed)
 Detailed voice message left that we have samples ready for pick up.

## 2024-02-24 ENCOUNTER — Inpatient Hospital Stay: Payer: Medicare Other

## 2024-02-24 ENCOUNTER — Inpatient Hospital Stay: Payer: Medicare Other | Attending: Hematology

## 2024-02-24 VITALS — BP 143/67 | HR 74 | Temp 97.6°F | Resp 16

## 2024-02-24 DIAGNOSIS — C7951 Secondary malignant neoplasm of bone: Secondary | ICD-10-CM | POA: Diagnosis present

## 2024-02-24 DIAGNOSIS — Z1732 Human epidermal growth factor receptor 2 negative status: Secondary | ICD-10-CM | POA: Insufficient documentation

## 2024-02-24 DIAGNOSIS — Z170 Estrogen receptor positive status [ER+]: Secondary | ICD-10-CM | POA: Insufficient documentation

## 2024-02-24 DIAGNOSIS — Z5111 Encounter for antineoplastic chemotherapy: Secondary | ICD-10-CM | POA: Insufficient documentation

## 2024-02-24 DIAGNOSIS — C50911 Malignant neoplasm of unspecified site of right female breast: Secondary | ICD-10-CM

## 2024-02-24 DIAGNOSIS — Z1721 Progesterone receptor positive status: Secondary | ICD-10-CM | POA: Diagnosis not present

## 2024-02-24 DIAGNOSIS — C50411 Malignant neoplasm of upper-outer quadrant of right female breast: Secondary | ICD-10-CM | POA: Insufficient documentation

## 2024-02-24 LAB — CBC WITH DIFFERENTIAL/PLATELET
Abs Immature Granulocytes: 0.06 10*3/uL (ref 0.00–0.07)
Basophils Absolute: 0 10*3/uL (ref 0.0–0.1)
Basophils Relative: 0 %
Eosinophils Absolute: 0.1 10*3/uL (ref 0.0–0.5)
Eosinophils Relative: 1 %
HCT: 30.2 % — ABNORMAL LOW (ref 36.0–46.0)
Hemoglobin: 9.8 g/dL — ABNORMAL LOW (ref 12.0–15.0)
Immature Granulocytes: 1 %
Lymphocytes Relative: 11 %
Lymphs Abs: 0.8 10*3/uL (ref 0.7–4.0)
MCH: 31.8 pg (ref 26.0–34.0)
MCHC: 32.5 g/dL (ref 30.0–36.0)
MCV: 98.1 fL (ref 80.0–100.0)
Monocytes Absolute: 0.6 10*3/uL (ref 0.1–1.0)
Monocytes Relative: 8 %
Neutro Abs: 5.7 10*3/uL (ref 1.7–7.7)
Neutrophils Relative %: 79 %
Platelets: 279 10*3/uL (ref 150–400)
RBC: 3.08 MIL/uL — ABNORMAL LOW (ref 3.87–5.11)
RDW: 14.3 % (ref 11.5–15.5)
WBC: 7.2 10*3/uL (ref 4.0–10.5)
nRBC: 0 % (ref 0.0–0.2)

## 2024-02-24 LAB — COMPREHENSIVE METABOLIC PANEL
ALT: 54 U/L — ABNORMAL HIGH (ref 0–44)
AST: 109 U/L — ABNORMAL HIGH (ref 15–41)
Albumin: 2.2 g/dL — ABNORMAL LOW (ref 3.5–5.0)
Alkaline Phosphatase: 863 U/L — ABNORMAL HIGH (ref 38–126)
Anion gap: 9 (ref 5–15)
BUN: 17 mg/dL (ref 8–23)
CO2: 30 mmol/L (ref 22–32)
Calcium: 9.3 mg/dL (ref 8.9–10.3)
Chloride: 95 mmol/L — ABNORMAL LOW (ref 98–111)
Creatinine, Ser: 0.61 mg/dL (ref 0.44–1.00)
GFR, Estimated: >60 mL/min (ref >60)
Glucose, Bld: 143 mg/dL — ABNORMAL HIGH (ref 70–99)
Potassium: 4.7 mmol/L (ref 3.5–5.1)
Sodium: 134 mmol/L — ABNORMAL LOW (ref 135–145)
Total Bilirubin: 3 mg/dL — ABNORMAL HIGH (ref 0.0–1.2)
Total Protein: 7.1 g/dL (ref 6.5–8.1)

## 2024-02-24 MED ORDER — METHOCARBAMOL 500 MG PO TABS
500.0000 mg | ORAL_TABLET | Freq: Once | ORAL | Status: AC
  Administered 2024-02-24: 500 mg via ORAL
  Filled 2024-02-24: qty 1

## 2024-02-24 MED ORDER — DENOSUMAB 120 MG/1.7ML ~~LOC~~ SOLN
120.0000 mg | Freq: Once | SUBCUTANEOUS | Status: AC
  Administered 2024-02-24: 120 mg via SUBCUTANEOUS
  Filled 2024-02-24: qty 1.7

## 2024-02-24 MED ORDER — FULVESTRANT 250 MG/5ML IM SOSY
500.0000 mg | PREFILLED_SYRINGE | Freq: Once | INTRAMUSCULAR | Status: AC
  Administered 2024-02-24: 500 mg via INTRAMUSCULAR
  Filled 2024-02-24: qty 10

## 2024-02-24 NOTE — Patient Instructions (Signed)
 [facility] [place] - A DEPT OF [facility]  Discharge Instructions: Thank you for choosing [facility] to provide your oncology and hematology care.  If you have a lab appointment with the Cancer Center - please note that after April 8th, 2024, all labs will be drawn in the cancer center.  You do not have to check in or register with the main entrance as you have in the past but will complete your check-in in the cancer center.  Wear comfortable clothing and clothing appropriate for easy access to any Portacath or PICC line.   We strive to give you quality time with your provider. You may need to reschedule your appointment if you arrive late (15 or more minutes).  Arriving late affects you and other patients whose appointments are after yours.  Also, if you miss three or more appointments without notifying the office, you may be dismissed from the clinic at the provider's discretion.      For prescription refill requests, have your pharmacy contact our office and allow 72 hours for refills to be completed.    Today you received the following chemotherapy and/or immunotherapy agents Xgeva and Faslodex      BELOW ARE SYMPTOMS THAT SHOULD BE REPORTED IMMEDIATELY: *FEVER GREATER THAN 100.4 F (38 C) OR HIGHER *CHILLS OR SWEATING *NAUSEA AND VOMITING THAT IS NOT CONTROLLED WITH YOUR NAUSEA MEDICATION *UNUSUAL SHORTNESS OF BREATH *UNUSUAL BRUISING OR BLEEDING *URINARY PROBLEMS (pain or burning when urinating, or frequent urination) *BOWEL PROBLEMS (unusual diarrhea, constipation, pain near the anus) TENDERNESS IN MOUTH AND THROAT WITH OR WITHOUT PRESENCE OF ULCERS (sore throat, sores in mouth, or a toothache) UNUSUAL RASH, SWELLING OR PAIN  UNUSUAL VAGINAL DISCHARGE OR ITCHING   Items with * indicate a potential emergency and should be followed up as soon as possible or go to the Emergency Department if any problems should occur.  Please show the  CHEMOTHERAPY ALERT CARD or IMMUNOTHERAPY ALERT CARD at check-in to the Emergency Department and triage nurse.  Should you have questions after your visit or need to cancel or reschedule your appointment, please contact [facility] [place] - A DEPT OF [facility] [phone]  and follow the prompts.  Office hours are 8:00 a.m. to 4:30 p.m. Monday - Friday. Please note that voicemails left after 4:00 p.m. may not be returned until the following business day.  We are closed weekends and major holidays. You have access to a nurse at all times for urgent questions. Please call the main number to the clinic [phone] and follow the prompts.  For any non-urgent questions, you may also contact your provider using MyChart. We now offer e-Visits for anyone 64 and older to request care online for non-urgent symptoms. For details visit [email or web address].   Also download the MyChart app! Go to the app store, search "MyChart", open the app, select [facility], and log in with your MyChart username and password.

## 2024-02-24 NOTE — Progress Notes (Signed)
 Stacey [name] presents today for injection per the provider's orders.  Faslodex and Xgeva administration without incident; injection site WNL; see MAR for injection details.  Patient tolerated procedure well and without incident.  No questions or complaints noted at this time. Patient's Calcium noted to be 9.3 today.  Patient denies any tooth or jaw pain and no recent or future dental appointments at this time. Patient reports taking Calcium/Vit D supplements as directed. EMS transport made aware patient is ready for pickup.

## 2024-02-25 LAB — CANCER ANTIGEN 27.29: CA 27.29: 31.2 U/mL (ref 0.0–38.6)

## 2024-02-25 LAB — CANCER ANTIGEN 15-3: CA 15-3: 21.1 U/mL (ref 0.0–25.0)

## 2024-03-08 ENCOUNTER — Emergency Department (HOSPITAL_COMMUNITY)

## 2024-03-08 ENCOUNTER — Other Ambulatory Visit: Payer: Self-pay

## 2024-03-08 ENCOUNTER — Inpatient Hospital Stay (HOSPITAL_COMMUNITY)
Admission: EM | Admit: 2024-03-08 | Discharge: 2024-03-09 | DRG: 444 | Disposition: A | Discharge status: Other Acute Inpatient Hospital | Attending: Family Medicine | Admitting: Family Medicine

## 2024-03-08 DIAGNOSIS — D72829 Elevated white blood cell count, unspecified: Secondary | ICD-10-CM | POA: Diagnosis present

## 2024-03-08 DIAGNOSIS — Z825 Family history of asthma and other chronic lower respiratory diseases: Secondary | ICD-10-CM

## 2024-03-08 DIAGNOSIS — D649 Anemia, unspecified: Secondary | ICD-10-CM | POA: Diagnosis present

## 2024-03-08 DIAGNOSIS — K831 Obstruction of bile duct: Principal | ICD-10-CM | POA: Diagnosis present

## 2024-03-08 DIAGNOSIS — G893 Neoplasm related pain (acute) (chronic): Secondary | ICD-10-CM | POA: Diagnosis not present

## 2024-03-08 DIAGNOSIS — C50919 Malignant neoplasm of unspecified site of unspecified female breast: Secondary | ICD-10-CM | POA: Diagnosis not present

## 2024-03-08 DIAGNOSIS — I2510 Atherosclerotic heart disease of native coronary artery without angina pectoris: Secondary | ICD-10-CM | POA: Diagnosis present

## 2024-03-08 DIAGNOSIS — E785 Hyperlipidemia, unspecified: Secondary | ICD-10-CM | POA: Diagnosis present

## 2024-03-08 DIAGNOSIS — J449 Chronic obstructive pulmonary disease, unspecified: Secondary | ICD-10-CM | POA: Diagnosis present

## 2024-03-08 DIAGNOSIS — R17 Unspecified jaundice: Secondary | ICD-10-CM | POA: Diagnosis not present

## 2024-03-08 DIAGNOSIS — Z85830 Personal history of malignant neoplasm of bone: Secondary | ICD-10-CM

## 2024-03-08 DIAGNOSIS — K269 Duodenal ulcer, unspecified as acute or chronic, without hemorrhage or perforation: Secondary | ICD-10-CM | POA: Diagnosis not present

## 2024-03-08 DIAGNOSIS — Z7401 Bed confinement status: Secondary | ICD-10-CM

## 2024-03-08 DIAGNOSIS — I252 Old myocardial infarction: Secondary | ICD-10-CM

## 2024-03-08 DIAGNOSIS — Z79899 Other long term (current) drug therapy: Secondary | ICD-10-CM

## 2024-03-08 DIAGNOSIS — R7401 Elevation of levels of liver transaminase levels: Secondary | ICD-10-CM | POA: Diagnosis present

## 2024-03-08 DIAGNOSIS — Z853 Personal history of malignant neoplasm of breast: Secondary | ICD-10-CM | POA: Diagnosis not present

## 2024-03-08 DIAGNOSIS — Z66 Do not resuscitate: Secondary | ICD-10-CM | POA: Diagnosis present

## 2024-03-08 DIAGNOSIS — Z87891 Personal history of nicotine dependence: Secondary | ICD-10-CM

## 2024-03-08 DIAGNOSIS — Z888 Allergy status to other drugs, medicaments and biological substances status: Secondary | ICD-10-CM

## 2024-03-08 DIAGNOSIS — R7989 Other specified abnormal findings of blood chemistry: Secondary | ICD-10-CM | POA: Diagnosis not present

## 2024-03-08 DIAGNOSIS — Z5986 Financial insecurity: Secondary | ICD-10-CM

## 2024-03-08 DIAGNOSIS — E871 Hypo-osmolality and hyponatremia: Secondary | ICD-10-CM | POA: Diagnosis present

## 2024-03-08 DIAGNOSIS — I10 Essential (primary) hypertension: Secondary | ICD-10-CM | POA: Diagnosis not present

## 2024-03-08 DIAGNOSIS — D630 Anemia in neoplastic disease: Secondary | ICD-10-CM | POA: Diagnosis present

## 2024-03-08 DIAGNOSIS — N179 Acute kidney failure, unspecified: Secondary | ICD-10-CM | POA: Diagnosis present

## 2024-03-08 DIAGNOSIS — E162 Hypoglycemia, unspecified: Secondary | ICD-10-CM | POA: Diagnosis present

## 2024-03-08 DIAGNOSIS — I5042 Chronic combined systolic (congestive) and diastolic (congestive) heart failure: Secondary | ICD-10-CM | POA: Diagnosis present

## 2024-03-08 DIAGNOSIS — K264 Chronic or unspecified duodenal ulcer with hemorrhage: Secondary | ICD-10-CM | POA: Diagnosis present

## 2024-03-08 DIAGNOSIS — Z8249 Family history of ischemic heart disease and other diseases of the circulatory system: Secondary | ICD-10-CM

## 2024-03-08 DIAGNOSIS — Z955 Presence of coronary angioplasty implant and graft: Secondary | ICD-10-CM | POA: Diagnosis not present

## 2024-03-08 DIAGNOSIS — Z7901 Long term (current) use of anticoagulants: Secondary | ICD-10-CM | POA: Diagnosis not present

## 2024-03-08 DIAGNOSIS — C50911 Malignant neoplasm of unspecified site of right female breast: Secondary | ICD-10-CM | POA: Diagnosis present

## 2024-03-08 DIAGNOSIS — G9341 Metabolic encephalopathy: Secondary | ICD-10-CM | POA: Diagnosis present

## 2024-03-08 DIAGNOSIS — E875 Hyperkalemia: Secondary | ICD-10-CM | POA: Diagnosis present

## 2024-03-08 DIAGNOSIS — K8309 Other cholangitis: Secondary | ICD-10-CM | POA: Diagnosis present

## 2024-03-08 DIAGNOSIS — G894 Chronic pain syndrome: Secondary | ICD-10-CM | POA: Diagnosis present

## 2024-03-08 DIAGNOSIS — C7902 Secondary malignant neoplasm of left kidney and renal pelvis: Secondary | ICD-10-CM | POA: Diagnosis present

## 2024-03-08 DIAGNOSIS — E860 Dehydration: Secondary | ICD-10-CM | POA: Diagnosis present

## 2024-03-08 DIAGNOSIS — B9681 Helicobacter pylori [H. pylori] as the cause of diseases classified elsewhere: Secondary | ICD-10-CM | POA: Diagnosis not present

## 2024-03-08 DIAGNOSIS — I110 Hypertensive heart disease with heart failure: Secondary | ICD-10-CM | POA: Diagnosis present

## 2024-03-08 DIAGNOSIS — C642 Malignant neoplasm of left kidney, except renal pelvis: Secondary | ICD-10-CM | POA: Diagnosis present

## 2024-03-08 DIAGNOSIS — R8281 Pyuria: Secondary | ICD-10-CM | POA: Diagnosis not present

## 2024-03-08 DIAGNOSIS — I255 Ischemic cardiomyopathy: Secondary | ICD-10-CM | POA: Diagnosis present

## 2024-03-08 DIAGNOSIS — Z5982 Transportation insecurity: Secondary | ICD-10-CM

## 2024-03-08 DIAGNOSIS — Z7902 Long term (current) use of antithrombotics/antiplatelets: Secondary | ICD-10-CM

## 2024-03-08 DIAGNOSIS — E782 Mixed hyperlipidemia: Secondary | ICD-10-CM | POA: Diagnosis present

## 2024-03-08 DIAGNOSIS — Z515 Encounter for palliative care: Secondary | ICD-10-CM | POA: Diagnosis not present

## 2024-03-08 DIAGNOSIS — N390 Urinary tract infection, site not specified: Principal | ICD-10-CM | POA: Diagnosis present

## 2024-03-08 DIAGNOSIS — K315 Obstruction of duodenum: Secondary | ICD-10-CM | POA: Diagnosis present

## 2024-03-08 DIAGNOSIS — N170 Acute kidney failure with tubular necrosis: Secondary | ICD-10-CM | POA: Diagnosis present

## 2024-03-08 DIAGNOSIS — K838 Other specified diseases of biliary tract: Secondary | ICD-10-CM | POA: Diagnosis not present

## 2024-03-08 DIAGNOSIS — I447 Left bundle-branch block, unspecified: Secondary | ICD-10-CM | POA: Diagnosis present

## 2024-03-08 DIAGNOSIS — Z7189 Other specified counseling: Secondary | ICD-10-CM | POA: Diagnosis not present

## 2024-03-08 DIAGNOSIS — E8721 Acute metabolic acidosis: Secondary | ICD-10-CM | POA: Diagnosis present

## 2024-03-08 DIAGNOSIS — E8809 Other disorders of plasma-protein metabolism, not elsewhere classified: Secondary | ICD-10-CM | POA: Diagnosis present

## 2024-03-08 DIAGNOSIS — C7951 Secondary malignant neoplasm of bone: Secondary | ICD-10-CM | POA: Diagnosis present

## 2024-03-08 DIAGNOSIS — R5383 Other fatigue: Secondary | ICD-10-CM | POA: Diagnosis not present

## 2024-03-08 DIAGNOSIS — I480 Paroxysmal atrial fibrillation: Secondary | ICD-10-CM | POA: Diagnosis present

## 2024-03-08 DIAGNOSIS — G8929 Other chronic pain: Secondary | ICD-10-CM | POA: Diagnosis present

## 2024-03-08 LAB — URINALYSIS, ROUTINE W REFLEX MICROSCOPIC
Bilirubin Urine: NEGATIVE
Glucose, UA: NEGATIVE mg/dL
Ketones, ur: NEGATIVE mg/dL
Nitrite: NEGATIVE
Protein, ur: 100 mg/dL — AB
Specific Gravity, Urine: 1.008 (ref 1.005–1.030)
WBC, UA: >50 WBC/hpf (ref 0–5)
pH: 5 (ref 5.0–8.0)

## 2024-03-08 LAB — COMPREHENSIVE METABOLIC PANEL WITH GFR
ALT: 171 U/L — ABNORMAL HIGH (ref 0–44)
AST: 550 U/L — ABNORMAL HIGH (ref 15–41)
Albumin: 1.8 g/dL — ABNORMAL LOW (ref 3.5–5.0)
Alkaline Phosphatase: 1489 U/L — ABNORMAL HIGH (ref 38–126)
Anion gap: 12 (ref 5–15)
BUN: 38 mg/dL — ABNORMAL HIGH (ref 8–23)
CO2: 22 mmol/L (ref 22–32)
Calcium: 8.8 mg/dL — ABNORMAL LOW (ref 8.9–10.3)
Chloride: 94 mmol/L — ABNORMAL LOW (ref 98–111)
Creatinine, Ser: 1.91 mg/dL — ABNORMAL HIGH (ref 0.44–1.00)
GFR, Estimated: 27 mL/min — ABNORMAL LOW (ref >60)
Glucose, Bld: 100 mg/dL — ABNORMAL HIGH (ref 70–99)
Potassium: 5.6 mmol/L — ABNORMAL HIGH (ref 3.5–5.1)
Sodium: 128 mmol/L — ABNORMAL LOW (ref 135–145)
Total Bilirubin: 11.3 mg/dL — ABNORMAL HIGH (ref 0.0–1.2)
Total Protein: 7.1 g/dL (ref 6.5–8.1)

## 2024-03-08 LAB — BLOOD GAS, VENOUS
Acid-base deficit: 0.8 mmol/L (ref 0.0–2.0)
Bicarbonate: 25.4 mmol/L (ref 20.0–28.0)
Drawn by: 33092
O2 Saturation: 32.9 %
Patient temperature: 36.5
pCO2, Ven: 46 mmHg (ref 44–60)
pH, Ven: 7.35 (ref 7.25–7.43)
pO2, Ven: <31 mmHg — CL (ref 32–45)

## 2024-03-08 LAB — CBC WITH DIFFERENTIAL/PLATELET
Abs Immature Granulocytes: 0.17 10*3/uL — ABNORMAL HIGH (ref 0.00–0.07)
Basophils Absolute: 0 10*3/uL (ref 0.0–0.1)
Basophils Relative: 0 %
Eosinophils Absolute: 0 10*3/uL (ref 0.0–0.5)
Eosinophils Relative: 0 %
HCT: 27.5 % — ABNORMAL LOW (ref 36.0–46.0)
Hemoglobin: 9 g/dL — ABNORMAL LOW (ref 12.0–15.0)
Immature Granulocytes: 1 %
Lymphocytes Relative: 2 %
Lymphs Abs: 0.4 10*3/uL — ABNORMAL LOW (ref 0.7–4.0)
MCH: 31.3 pg (ref 26.0–34.0)
MCHC: 32.7 g/dL (ref 30.0–36.0)
MCV: 95.5 fL (ref 80.0–100.0)
Monocytes Absolute: 0.8 10*3/uL (ref 0.1–1.0)
Monocytes Relative: 4 %
Neutro Abs: 16.4 10*3/uL — ABNORMAL HIGH (ref 1.7–7.7)
Neutrophils Relative %: 93 %
Platelets: 269 10*3/uL (ref 150–400)
RBC: 2.88 MIL/uL — ABNORMAL LOW (ref 3.87–5.11)
RDW: 16.6 % — ABNORMAL HIGH (ref 11.5–15.5)
WBC: 17.8 10*3/uL — ABNORMAL HIGH (ref 4.0–10.5)
nRBC: 0 % (ref 0.0–0.2)

## 2024-03-08 LAB — RAPID URINE DRUG SCREEN, HOSP PERFORMED
Amphetamines: NOT DETECTED
Barbiturates: NOT DETECTED
Benzodiazepines: NOT DETECTED
Cocaine: NOT DETECTED
Opiates: POSITIVE — AB
Tetrahydrocannabinol: NOT DETECTED

## 2024-03-08 LAB — AMMONIA: Ammonia: 23 umol/L (ref 9–35)

## 2024-03-08 LAB — PROTIME-INR
INR: 1.2 (ref 0.8–1.2)
Prothrombin Time: 15.3 s — ABNORMAL HIGH (ref 11.4–15.2)

## 2024-03-08 LAB — CBG MONITORING, ED
Glucose-Capillary: 46 mg/dL — ABNORMAL LOW (ref 70–99)
Glucose-Capillary: 42 mg/dL — CL (ref 70–99)
Glucose-Capillary: 136 mg/dL — ABNORMAL HIGH (ref 70–99)

## 2024-03-08 LAB — SALICYLATE LEVEL: Salicylate Lvl: <7 mg/dL — ABNORMAL LOW (ref 7.0–30.0)

## 2024-03-08 LAB — ACETAMINOPHEN LEVEL: Acetaminophen (Tylenol), Serum: <10 ug/mL — ABNORMAL LOW (ref 10–30)

## 2024-03-08 LAB — LIPASE, BLOOD: Lipase: 25 U/L (ref 11–51)

## 2024-03-08 MED ORDER — GADOBUTROL 1 MMOL/ML IV SOLN
9.0000 mL | Freq: Once | INTRAVENOUS | Status: AC | PRN
  Administered 2024-03-08: 9 mL via INTRAVENOUS

## 2024-03-08 MED ORDER — DEXTROSE 50 % IV SOLN
1.0000 | Freq: Once | INTRAVENOUS | Status: AC
Start: 2024-03-08 — End: 2024-03-08
  Administered 2024-03-08: 50 mL via INTRAVENOUS
  Filled 2024-03-08: qty 50

## 2024-03-08 MED ORDER — PIPERACILLIN-TAZOBACTAM 3.375 G IVPB
3.3750 g | Freq: Three times a day (TID) | INTRAVENOUS | Status: DC
  Administered 2024-03-09 (×2): 3.375 g via INTRAVENOUS
  Filled 2024-03-08 (×2): qty 50

## 2024-03-08 MED ORDER — LACTATED RINGERS IV BOLUS
1000.0000 mL | Freq: Once | INTRAVENOUS | Status: AC
  Administered 2024-03-08: 1000 mL via INTRAVENOUS

## 2024-03-08 MED ORDER — SODIUM CHLORIDE 0.9 % IV BOLUS: 500.0000 mL | Freq: Once | INTRAVENOUS | Status: DC

## 2024-03-08 MED ORDER — DEXTROSE 10 % IV SOLN: Freq: Once | INTRAVENOUS | Status: AC

## 2024-03-08 MED ORDER — DEXTROSE 50 % IV SOLN
1.0000 | Freq: Once | INTRAVENOUS | Status: AC
  Administered 2024-03-08: 50 mL via INTRAVENOUS
  Filled 2024-03-08: qty 50

## 2024-03-08 MED ORDER — PIPERACILLIN-TAZOBACTAM 3.375 G IVPB
3.3750 g | Freq: Once | INTRAVENOUS | Status: AC
  Administered 2024-03-08: 3.375 g via INTRAVENOUS
  Filled 2024-03-08: qty 50

## 2024-03-08 MED ORDER — ALBUTEROL SULFATE (2.5 MG/3ML) 0.083% IN NEBU
2.5000 mg | INHALATION_SOLUTION | Freq: Once | RESPIRATORY_TRACT | Status: AC
  Administered 2024-03-08: 2.5 mg via RESPIRATORY_TRACT
  Filled 2024-03-08: qty 3

## 2024-03-08 MED ORDER — HYDROMORPHONE HCL 1 MG/ML IJ SOLN
0.5000 mg | INTRAMUSCULAR | Status: DC | PRN
  Administered 2024-03-09: 0.5 mg via INTRAVENOUS
  Filled 2024-03-08: qty 0.5

## 2024-03-08 MED ORDER — HYDROCORTISONE SOD SUC (PF) 100 MG IJ SOLR
100.0000 mg | Freq: Once | INTRAMUSCULAR | Status: AC
  Administered 2024-03-08: 100 mg via INTRAVENOUS
  Filled 2024-03-08: qty 2

## 2024-03-08 MED ORDER — HYDROMORPHONE HCL 1 MG/ML IJ SOLN: 1.0000 mg | INTRAMUSCULAR | Status: DC | PRN

## 2024-03-08 MED ORDER — ONDANSETRON HCL 4 MG/2ML IJ SOLN: 4.0000 mg | Freq: Four times a day (QID) | INTRAMUSCULAR | Status: DC | PRN

## 2024-03-08 NOTE — H&P (Signed)
 History and Physical    Patient: Stacey [name] DOB: 1946-04-20 DOA: 03/08/2024 DOS: the patient was seen and examined on 03/09/2024 PCP: Ignatius Specking, MD  Patient coming from: Home  Chief Complaint:  Chief Complaint  Patient presents with   Weakness   Generalized Body Aches   Fatigue   HPI: Stacey [name] is a 78 y.o. female with medical history significant of hypertension, hyperlipidemia, CAD, breast cancer (follows with Dr. Ellin Saba) who presents to the emergency department due to altered mental status.  At bedside, patient was somnolent and though arousable, but quickly goes back to sleep and was unable to provide history.  History was obtained from ED physician and ED medical record.  Per report, patient's son who lives with her noted that she was more confused this morning.  Patient's sister also noted an onset of jaundice inpatient since Sunday (4/6).  Patient was reported to complain of generalized bodyaches despite being on Dilaudid, Voltaren gel, Lyrica, morphine given through intrathecal pump.  ED Course:  In the emergency department, she was hemodynamically stable on arrival to the ED.  Workup in ED showed leukocytosis and normocytic anemia, BMP showed sodium 128, potassium 5.6, chloride 94, bicarb 22, blood glucose 100, BUN 38, creatinine 1.91, albumin 1.8, AST 550, ALT 171, ALP 1,489.  Urine drug screen was positive for opiates, urinalysis was suspicious for UTI. CT chest, abdomen and pelvis without contrast showed no acute abnormality in the chest, abdomen or pelvis. CT head without contrast showed no acute intracranial abnormality RUQ ultrasound was suggestive to be sludge versus less likely a mass Albuterol was given, D50 was provided due to a momentary hypoglycemia.  Solu-Cortef was also given. Zosyn was given empirically for possible hepatobiliary infection. Gastroenterologist on-call (Dr. Levon Hedger) was consulted and recommended admission with plan to  follow-up on patient after labs to follow-up on MRCP ordered to determine if patient requires ERCP Hospitalist was asked admit patient for further evaluation and management.   Review of Systems:  Review of systems as noted in the HPI. All other systems reviewed and are negative.   Past Medical History:  Diagnosis Date   CAD in native artery    a. DES to ramus 2005 with late stent thrombosis 2006 tx with PTCA. 12/18 PCI/DES x1 to mRCA, EF 50-55%   CHF (congestive heart failure) (HCC)    Chronic pain    COPD (chronic obstructive pulmonary disease) (HCC)    Hyperlipidemia    Hypertension    Left bundle branch block    Lymphedema    Metastatic breast cancer    a. to bone.   MI (myocardial infarction) (HCC)    Mild aortic stenosis 10/2017   Morbid obesity (HCC)    PAF (paroxysmal atrial fibrillation) (HCC)    PSVT (paroxysmal supraventricular tachycardia) (HCC)    a. per [facility] notes, seen on event monitor in 2014.   Pulmonary nodules    Past Surgical History:  Procedure Laterality Date   CHOLECYSTECTOMY N/A 03/29/2021   Procedure: LAPAROSCOPIC CHOLECYSTECTOMY;  Surgeon: Lucretia Roers, MD;  Location: [facility];  Service: General;  Laterality: N/A;   CORONARY STENT INTERVENTION N/A 11/26/2017   Procedure: CORONARY STENT INTERVENTION;  Surgeon: Swaziland, Peter M, MD;  Location: [facility] INVASIVE CV LAB;  Service: Cardiovascular;  Laterality: N/A;   CORONARY STENT PLACEMENT     ERCP N/A 03/28/2021   Procedure: ENDOSCOPIC RETROGRADE CHOLANGIOPANCREATOGRAPHY (ERCP);  Surgeon: Malissa Hippo, MD;  Location: [facility];  Service: Endoscopy;  Laterality: N/A;   intrathecal pain pump     LEFT HEART CATH AND CORONARY ANGIOGRAPHY N/A 11/26/2017   Procedure: LEFT HEART CATH AND CORONARY ANGIOGRAPHY;  Surgeon: Swaziland, Peter M, MD;  Location: [facility] INVASIVE CV LAB;  Service: Cardiovascular;  Laterality: N/A;   REMOVAL OF STONES  03/28/2021   Procedure: REMOVAL OF STONES;  Surgeon: Malissa Hippo, MD;   Location: [facility];  Service: Endoscopy;;   SPHINCTEROTOMY  03/28/2021   Procedure: SPHINCTEROTOMY;  Surgeon: Malissa Hippo, MD;  Location: [facility];  Service: Endoscopy;;   UMBILICAL HERNIA REPAIR N/A 04/03/2021   Procedure: ADULT PRIMARY UMBILICAL HERNIA REPAIR;  Surgeon: Lucretia Roers, MD;  Location: [facility];  Service: General;  Laterality: N/A;   VASCULAR SURGERY      Social History:  reports that she quit smoking about 43 years ago. Her smoking use included cigarettes. She started smoking about 61 years ago. She has a 36 pack-year smoking history. She has never used smokeless tobacco. She reports current drug use. Drugs: Oxycodone and Morphine. She reports that she does not drink alcohol.   Allergies  Allergen Reactions   Brilinta [Ticagrelor] Diarrhea and Nausea Only    Nausea and severe diarrhea- general weakness. Patient says does not want to take again   Budesonide Palpitations   Albuterol Other (See Comments)    Heart racing     Family History  Problem Relation Age of Onset   CAD Father 6   Heart attack Father    COPD Sister    CAD Paternal Grandmother    Sudden Cardiac Death Neg Hx    Colon polyps Neg Hx    Colon cancer Neg Hx      Prior to Admission medications   Medication Sig Start Date End Date Taking? Authorizing Provider  acetaminophen (TYLENOL) 325 MG tablet Take 2 tablets (650 mg total) by mouth every 6 (six) hours as needed for mild pain, fever or headache. 04/05/21   Shon Hale, MD  amLODipine (NORVASC) 5 MG tablet TAKE 1 TABLET EVERY DAY 05/12/23   Branch, Dorothe Pea, MD  apixaban (ELIQUIS) 5 MG TABS tablet Take 1 tablet (5 mg total) by mouth 2 (two) times daily. 02/08/24   Antoine Poche, MD  atorvastatin (LIPITOR) 80 MG tablet TAKE 1 TABLET EVERY MORNING 08/05/23   Antoine Poche, MD  b complex vitamins tablet Take 1 tablet by mouth every morning. 07/05/20   Albertine Grates, MD  carboxymethylcellulose (REFRESH PLUS) 0.5 % SOLN Place 1 drop into both  eyes as needed.    [provider]  clopidogrel (PLAVIX) 75 MG tablet Take 1 tablet (75 mg total) by mouth daily. 08/25/23   Antoine Poche, MD  DULoxetine (CYMBALTA) 30 MG capsule Take 3 capsules (90 mg total) by mouth daily. Patient taking differently: Take 30 mg by mouth 2 (two) times daily. 07/06/20   Albertine Grates, MD  ferrous sulfate 325 (65 FE) MG tablet Take 325 mg by mouth daily.    [provider]  HYDROmorphone (DILAUDID) 2 MG tablet Take 1 tablet (2 mg total) by mouth every 6 (six) hours as needed for severe pain (breakthrough pain). 02/26/23   Harris, Cammy Copa, PA-C  lidocaine (LIDODERM) 5 % Place 1 patch onto the skin daily. Remove & Discard patch within 12 hours or as directed by MD 12/07/23   Trisha Mangle, Tammy, PA-C  losartan (COZAAR) 100 MG tablet TAKE 1 TABLET EVERY DAY 11/02/23   Antoine Poche, MD  methadone (  DOLOPHINE) 5 MG tablet Take 5 mg by mouth every 8 (eight) hours. 09/03/23   [provider]  methocarbamol (ROBAXIN) 500 MG tablet Take 1 tablet (500 mg total) by mouth 3 (three) times daily. 12/07/23   Triplett, Tammy, PA-C  metoprolol succinate (TOPROL-XL) 100 MG 24 hr tablet TAKE 1 AND 1/2 TABLETS EVERY DAY. DOSE INCREASE. 07/01/23   Antoine Poche, MD  Multiple Vitamin (MULTIVITAMIN ADULT PO) Take 1 tablet by mouth daily.    [provider]  Multiple Vitamins-Minerals (EQ VISION FORMULA 50+ PO) Take 1 tablet by mouth every morning.    [provider]  naloxone [facility]) nasal spray 4 mg/0.1 mL Place into the nose. 02/27/23   [provider]  nitroGLYCERIN (NITROSTAT) 0.4 MG SL tablet Place 1 tablet (0.4 mg total) under the tongue every 5 (five) minutes x 3 doses as needed for chest pain (if no relief after 3rd dose, proceed to the ED or call 911). 05/05/23   Antoine Poche, MD  oxyCODONE (OXY IR/ROXICODONE) 5 MG immediate release tablet Take by mouth. 01/07/23   [provider]  Oxycodone HCl 10 MG TABS Take 10 mg by  mouth 3 (three) times daily as needed. 08/26/22   [provider]  PAIN MANAGEMENT INTRATHECAL, IT, PUMP by Intrathecal route. 02/21/21   [provider]  pregabalin (LYRICA) 50 MG capsule Take 50 mg by mouth daily as needed (pain). 01/26/21   [provider]    Physical Exam: BP 134/78   Pulse 75   Temp 98 F (36.7 C) (Axillary)   Resp 15   Wt 90.1 kg   SpO2 90%   BMI 38.79 kg/m   General: 78 y.o. year-old female ill appearing, somnolent, but in no acute distress.  HEENT: NCAT, EOMI Neck: Supple, trachea medial, scleral icterus. Cardiovascular: Regular rate and rhythm with no rubs or gallops.  No thyromegaly or JVD noted.  No lower extremity edema. 2/4 pulses in all 4 extremities. Respiratory: Clear to auscultation with no wheezes or rales. Good inspiratory effort. Abdomen: Soft, nontender nondistended with normal bowel sounds x4 quadrants. Muskuloskeletal: No cyanosis, clubbing or edema noted bilaterally Neuro: Moving all extremities.  Limited neurological exam at this time due to current condition Skin: Jaundiced.  No ulcerative lesions noted or rashes Psychiatry: This cannot be assessed at this time due to patient current condition         Labs on Admission:  Basic Metabolic Panel: Recent Labs  Lab 03/08/24 1558  NA 128*  K 5.6*  CL 94*  CO2 22  GLUCOSE 100*  BUN 38*  CREATININE 1.91*  CALCIUM 8.8*   Liver Function Tests: Recent Labs  Lab 03/08/24 1558  AST 550*  ALT 171*  ALKPHOS 1,489*  BILITOT 11.3*  PROT 7.1  ALBUMIN 1.8*   Recent Labs  Lab 03/08/24 1558  LIPASE 25   Recent Labs  Lab 03/08/24 1558  AMMONIA 23   CBC: Recent Labs  Lab 03/08/24 1558  WBC 17.8*  NEUTROABS 16.4*  HGB 9.0*  HCT 27.5*  MCV 95.5  PLT 269   Cardiac Enzymes: No results for input(s): "CKTOTAL", "CKMB", "CKMBINDEX", "TROPONINI" in the last 168 hours.  BNP (last 3 results) No results for input(s): "BNP" in the last 8760 hours.  ProBNP  (last 3 results) No results for input(s): "PROBNP" in the last 8760 hours.  CBG: Recent Labs  Lab 03/08/24 1625 03/08/24 1726 03/08/24 1938 03/09/24 0116  GLUCAP 46* 42* 136* 174*  Radiological Exams on Admission: CT CHEST ABDOMEN PELVIS WO CONTRAST Result Date: 03/08/2024 CLINICAL DATA:  History of metastatic breast cancer with weakness, body aches, and lethargy associated with nausea and vomiting. * Tracking Code: BO * EXAM: CT CHEST, ABDOMEN AND PELVIS WITHOUT CONTRAST TECHNIQUE: Multidetector CT imaging of the chest, abdomen and pelvis was performed following the standard protocol without IV contrast. RADIATION DOSE REDUCTION: This exam was performed according to the departmental dose-optimization program which includes automated exposure control, adjustment of the mA and/or kV according to patient size and/or use of iterative reconstruction technique. COMPARISON:  CT chest, abdomen, and pelvis dated 01/01/2024 FINDINGS: CT CHEST FINDINGS Cardiovascular: Normal heart size. No significant pericardial fluid/thickening. Great vessels are normal in course and caliber. Coronary artery calcifications. Mediastinum/Nodes: Imaged thyroid gland without nodules meeting criteria for imaging follow-up by size. Normal esophagus. No pathologically enlarged axillary, supraclavicular, mediastinal, or hilar lymph nodes. Lungs/Pleura: The central airways are patent. Adherent secretions within the trachea. Unchanged irregular right apical nodule measuring 7 x 6 mm (3:34). No new or enlarging pulmonary nodules. No pneumothorax. No pleural effusion. Musculoskeletal: No acute or abnormal lytic or blastic osseous lesions. Multilevel degenerative changes of the thoracic spine. Unchanged calcified mass in the right breast. Punctate foci of gas within the right anterior chest appears anti dependent within small vessels in the right subclavian vein. CT ABDOMEN PELVIS FINDINGS Hepatobiliary: No focal hepatic lesions. Mild  intrahepatic bile duct dilation, which may be related to cholecystectomy. Trace pneumobilia at the hepatic hilum. Pancreas: No focal lesions or main ductal dilation. Spleen: Normal in size without focal abnormality. Adrenals/Urinary Tract: No adrenal nodules. No calculi or hydronephrosis. Left upper pole renal mass, better evaluated on prior contrast-enhanced examination. No focal bladder wall thickening. Stomach/Bowel: Normal appearance of the stomach. No evidence of bowel wall thickening, distention, or inflammatory changes. Large volume stool within the rectum. Moderate volume stool within the descending and sigmoid colon. Appendix is not discretely seen. Vascular/Lymphatic: Aortic atherosclerosis. No enlarged abdominal or pelvic lymph nodes. Reproductive: No adnexal masses. Other: No free fluid, fluid collection, or free air. Calcified nodule along the left anterior peritoneum, likely sequela of prior infection/inflammation. Musculoskeletal: Similar sclerotic lesion at L1-2 with L1 compression fracture and retropulsion of fracture fragments. Unchanged cortical irregularity of left inferior pubic ramus. Multilevel degenerative changes of the lumbar spine. Again seen is infusion pump within the right anterior abdominal wall. IMPRESSION: 1. No acute abnormality in the chest, abdomen, or pelvis. 2. Unchanged calcified right breast mass and irregular right apical nodule. No new or enlarging pulmonary nodules. 3. Similar sclerotic lesion at L1-2 with L1 compression fracture and retropulsion of fracture fragments. 4. Large volume stool within the rectum. Moderate volume stool within the descending and sigmoid colon. Recommend correlation with constipation. 5. Left upper pole renal mass is better evaluated on prior contrast-enhanced examination. 6. Aortic Atherosclerosis (ICD10-I70.0). Coronary artery calcifications. Assessment for potential risk factor modification, dietary therapy or pharmacologic therapy may be  warranted, if clinically indicated. Electronically Signed   By: Agustin Cree M.D.   On: 03/08/2024 20:56   CT Head Wo Contrast Result Date: 03/08/2024 CLINICAL DATA:  Mental status change, unknown cause EXAM: CT HEAD WITHOUT CONTRAST TECHNIQUE: Contiguous axial images were obtained from the base of the skull through the vertex without intravenous contrast. RADIATION DOSE REDUCTION: This exam was performed according to the departmental dose-optimization program which includes automated exposure control, adjustment of the mA and/or kV according to patient size and/or use of iterative reconstruction technique.  COMPARISON:  CT head 05/01/2021 FINDINGS: Brain: No evidence of large-territorial acute infarction. No parenchymal hemorrhage. No mass lesion. No extra-axial collection. No mass effect or midline shift. No hydrocephalus. Basilar cisterns are patent. Vascular: No hyperdense vessel. Atherosclerotic calcifications are present within the cavernous internal carotid arteries. Skull: No acute fracture or focal lesion. Sinuses/Orbits: Paranasal sinuses and mastoid air cells are clear. The orbits are unremarkable. Other: None. IMPRESSION: No acute intracranial abnormality. Electronically Signed   By: Tish Frederickson M.D.   On: 03/08/2024 20:09   US Abdomen Limited Result Date: 03/08/2024 CLINICAL DATA:  Jaundice EXAM: ULTRASOUND ABDOMEN LIMITED RIGHT UPPER QUADRANT COMPARISON:  CT abdomen pelvis 03/08/2024, CT abdomen pelvis 01/01/2024 FINDINGS: Gallbladder: Status post cholecystectomy. Common bile duct: Diameter: Thickened measuring up to 14 mm with associated echogenic avascular material within the common bile duct. Liver: No focal lesion identified. Within normal limits in parenchymal echogenicity. Portal vein is patent on color Doppler imaging with normal direction of blood flow towards the liver. Pneumobilia noted. Intrahepatic ducts measure at the upper limits of normal. Other: None. IMPRESSION: 1. Thickened common  bile duct with echogenic debris within the lumen. Intrahepatic ducts measure at the upper limits of normal. Finding could be sludge versus less likely a mass (given no vasculature identified). When the patient is clinically stable and able to follow directions and hold their breath (preferably as an outpatient) further evaluation with dedicated abdominal MRI should be considered. 2. Status post cholecystectomy. Electronically Signed   By: Tish Frederickson M.D.   On: 03/08/2024 20:07    EKG: I independently viewed the EKG done and my findings are as followed: Normal sinus rhythm at rate of 84 bpm with nonspecific IVCD with LAD  Assessment/Plan Present on Admission:  Acute metabolic encephalopathy  Essential hypertension  Transaminitis  Jaundice  Leukocytosis  Mixed hyperlipidemia  Chronic pain  Principal Problem:   Acute metabolic encephalopathy Active Problems:   Essential hypertension   Transaminitis   Mixed hyperlipidemia   Chronic pain   Leukocytosis   Jaundice   UTI (urinary tract infection)   AKI (acute kidney injury) (HCC)  Acute metabolic encephalopathy This may be due to multifactorial including home pain meds (opiates) Continue fall precaution Continue to monitor patient for improvement  Presumed UTI POA Continue IV Zosyn  Transaminitis Jaundice Possible hepatobiliary infection AST 550, ALT 171 ALP 1849 CT abdomen and pelvis showed no acute intra-abdominal abnormality RUQ ultrasound was suggestive to be sludge versus less likely a mass MRCP was ordered Hepatitis panel will be checked Continue IV Zosyn for possible hepatobiliary infection Gastroenterology was consulted by EDP and will follow-up with patient in the morning  Acute kidney injury Creatinine 1.91 (baseline creatinine 0.5 - 0.7) Renally adjust medications, avoid nephrotoxic agents/dehydration/hypotension  Leukocytosis possibly reactive versus infectious WBC 17.8, continue IV Zosyn as described  above for possible hepatobiliary infection  Essential hypertension BP meds will be held at this time due to soft BP  Mixed hyperlipidemia Statin will be held at this time due to transaminitis  Chronic pain Continue methadone, Lyrica, Dilaudid  Goals of care Consult palliative care  DVT prophylaxis: Eliquis  Code Status: Full code  Family Communication: None at bedside  Consults: Gastroenterology  Severity of Illness: The appropriate patient status for this patient is INPATIENT. Inpatient status is judged to be reasonable and necessary in order to provide the required intensity of service to ensure the patient's safety. The patient's presenting symptoms, physical exam findings, and initial radiographic and laboratory data in  the context of their chronic comorbidities is felt to place them at high risk for further clinical deterioration. Furthermore, it is not anticipated that the patient will be medically stable for discharge from the hospital within 2 midnights of admission.   * I certify that at the point of admission it is my clinical judgment that the patient will require inpatient hospital care spanning beyond 2 midnights from the point of admission due to high intensity of service, high risk for further deterioration and high frequency of surveillance required.*  Author: Frankey Shown, DO 03/09/2024 4:37 AM  For on call review [email or web address].

## 2024-03-08 NOTE — Consult Note (Addendum)
 Pharmacy Antibiotic Note  Stacey [name] is a 78 y.o. female admitted on 03/08/2024 with  intra-abdominal . They arrived to the ED with weakness, body aches and lethargy. They stated they started to experience nausea and vomiting today. Pharmacy has been consulted for Zosyn dosing.  Plan: -- Zosyn 3.375g IV q8h (4 hour infusion). -- Monitor SCr 1.91 (elevated from baseline 0.6); CrCl 24.6 mL/min -- No cultures collected at this time   Weight: 90.1 kg (198 lb 9.6 oz)  Temp (24hrs), Avg:97.9 F (36.6 C), Min:97.7 F (36.5 C), Max:98 F (36.7 C)  Recent Labs  Lab 03/08/24 1558  WBC 17.8*  CREATININE 1.91*    Estimated Creatinine Clearance: 24.6 mL/min (A) (by C-G formula based on SCr of 1.91 mg/dL (H)).    Allergies  Allergen Reactions   Brilinta [Ticagrelor] Diarrhea and Nausea Only    Nausea and severe diarrhea- general weakness. Patient says does not want to take again   Budesonide Palpitations   Albuterol Other (See Comments)    Heart racing    Antimicrobials this admission: 4/8 Zosyn >>   Dose adjustments this admission: NA  Microbiology results: None at this time  Thank you for allowing pharmacy to be a part of this patient's care.  Effie Shy, PharmD Pharmacy Resident  03/08/2024 8:07 PM

## 2024-03-08 NOTE — ED Triage Notes (Signed)
 Patient BIB [facility] from home, for complaint of Weakness, Body aches, lethargy. Stated Nausea and vomiting started today.

## 2024-03-08 NOTE — ED Provider Notes (Signed)
 [place] [facility] DEPARTMENT AT [facility] Provider Note   CSN: [id number] Arrival date & time: 03/08/24  1416     History  Chief Complaint  Patient presents with   Weakness   Generalized Body Aches   Fatigue    Stacey [name] is a 78 y.o. female.   Weakness Associated symptoms: nausea   Patient presents for multiple complaints.  Medical history includes HTN, CAD, CHF, COPD, atrial fibrillation, anxiety, depression, metastatic breast cancer.  Breast cancer was diagnosed in 2017.  She is followed by Dr. Kirtland Bouchard.  Current cancer treatment is a monthly Faslodex.  She was last seen in oncology office 6 weeks ago.  She has been bedbound for the past year.  She lives at home with her son who provides all of her home care.  Although she does have some short-term memory loss at baseline, son was concerned about worsened confusion this morning.  Sister also reports that she has a new onset jaundice.  She first noticed this on Sunday.  The time before that that she had last seen her was a week prior.  Onset of jaundice was likely sometime last week.  Patient describes diffuse pain.  Home pain medications include Dilaudid, Voltaren gel, Lyrica, as well as morphine given through intrathecal pump.  Despite these measures, she does have ongoing chronic pain.  Patient denies any areas in particular where her pain is more prominent.  She states that she does have intermittent nausea.  Sister reports constipation.     Home Medications Prior to Admission medications   Medication Sig Start Date End Date Taking? Authorizing Provider  acetaminophen (TYLENOL) 325 MG tablet Take 2 tablets (650 mg total) by mouth every 6 (six) hours as needed for mild pain, fever or headache. 04/05/21   Shon Hale, MD  amLODipine (NORVASC) 5 MG tablet TAKE 1 TABLET EVERY DAY 05/12/23   Branch, Dorothe Pea, MD  apixaban (ELIQUIS) 5 MG TABS tablet Take 1 tablet (5 mg total) by mouth 2 (two) times daily. 02/08/24    Antoine Poche, MD  atorvastatin (LIPITOR) 80 MG tablet TAKE 1 TABLET EVERY MORNING 08/05/23   Antoine Poche, MD  b complex vitamins tablet Take 1 tablet by mouth every morning. 07/05/20   Albertine Grates, MD  carboxymethylcellulose (REFRESH PLUS) 0.5 % SOLN Place 1 drop into both eyes as needed.    [provider]  clopidogrel (PLAVIX) 75 MG tablet Take 1 tablet (75 mg total) by mouth daily. 08/25/23   Antoine Poche, MD  DULoxetine (CYMBALTA) 30 MG capsule Take 3 capsules (90 mg total) by mouth daily. Patient taking differently: Take 30 mg by mouth 2 (two) times daily. 07/06/20   Albertine Grates, MD  ferrous sulfate 325 (65 FE) MG tablet Take 325 mg by mouth daily.    [provider]  HYDROmorphone (DILAUDID) 2 MG tablet Take 1 tablet (2 mg total) by mouth every 6 (six) hours as needed for severe pain (breakthrough pain). 02/26/23   Harris, Cammy Copa, PA-C  lidocaine (LIDODERM) 5 % Place 1 patch onto the skin daily. Remove & Discard patch within 12 hours or as directed by MD 12/07/23   Trisha Mangle, Tammy, PA-C  losartan (COZAAR) 100 MG tablet TAKE 1 TABLET EVERY DAY 11/02/23   Antoine Poche, MD  methadone (DOLOPHINE) 5 MG tablet Take 5 mg by mouth every 8 (eight) hours. 09/03/23   [provider]  methocarbamol (ROBAXIN) 500 MG tablet Take 1 tablet (500 mg  total) by mouth 3 (three) times daily. 12/07/23   Triplett, Tammy, PA-C  metoprolol succinate (TOPROL-XL) 100 MG 24 hr tablet TAKE 1 AND 1/2 TABLETS EVERY DAY. DOSE INCREASE. 07/01/23   Antoine Poche, MD  Multiple Vitamin (MULTIVITAMIN ADULT PO) Take 1 tablet by mouth daily.    [provider]  Multiple Vitamins-Minerals (EQ VISION FORMULA 50+ PO) Take 1 tablet by mouth every morning.    [provider]  naloxone [facility]) nasal spray 4 mg/0.1 mL Place into the nose. 02/27/23   [provider]  nitroGLYCERIN (NITROSTAT) 0.4 MG SL tablet Place 1 tablet (0.4 mg total) under the tongue every 5 (five) minutes  x 3 doses as needed for chest pain (if no relief after 3rd dose, proceed to the ED or call 911). 05/05/23   Antoine Poche, MD  oxyCODONE (OXY IR/ROXICODONE) 5 MG immediate release tablet Take by mouth. 01/07/23   [provider]  Oxycodone HCl 10 MG TABS Take 10 mg by mouth 3 (three) times daily as needed. 08/26/22   [provider]  PAIN MANAGEMENT INTRATHECAL, IT, PUMP by Intrathecal route. 02/21/21   [provider]  pregabalin (LYRICA) 50 MG capsule Take 50 mg by mouth daily as needed (pain). 01/26/21   [provider]      Allergies    Brilinta [ticagrelor], Budesonide, and Albuterol    Review of Systems   Review of Systems  Unable to perform ROS: Mental status change  Constitutional:  Positive for appetite change and fatigue.  Gastrointestinal:  Positive for constipation and nausea.  Musculoskeletal:        Diffuse pain  Skin:  Positive for color change.  Neurological:  Positive for weakness (Generalized).  Psychiatric/Behavioral:  Positive for confusion.     Physical Exam Updated Vital Signs BP (!) 134/123   Pulse 86   Temp 98 F (36.7 C) (Axillary)   Resp 18   Wt 90.1 kg   SpO2 100%   BMI 38.79 kg/m  Physical Exam Vitals and nursing note reviewed.  Constitutional:      General: She is not in acute distress.    Appearance: She is well-developed. She is ill-appearing. She is not toxic-appearing or diaphoretic.  HENT:     Head: Normocephalic and atraumatic.     Right Ear: External ear normal.     Left Ear: External ear normal.     Nose: Nose normal.     Mouth/Throat:     Mouth: Mucous membranes are dry.  Eyes:     General: Scleral icterus present.     Extraocular Movements: Extraocular movements intact.  Cardiovascular:     Rate and Rhythm: Normal rate and regular rhythm.  Pulmonary:     Effort: Pulmonary effort is normal. No respiratory distress.     Breath sounds: Normal breath sounds.  Abdominal:     General: There is no  distension.     Palpations: Abdomen is soft.     Tenderness: There is no abdominal tenderness.  Musculoskeletal:        General: No swelling or deformity.     Cervical back: Normal range of motion and neck supple.  Skin:    General: Skin is warm and dry.     Capillary Refill: Capillary refill takes less than 2 seconds.     Coloration: Skin is jaundiced.  Neurological:     General: No focal deficit present.     Mental Status: She is alert. She is disoriented.  Psychiatric:        Mood and Affect: Mood normal.        Behavior: Behavior normal.     ED Results / Procedures / Treatments   Labs (all labs ordered are listed, but only abnormal results are displayed) Labs Reviewed  COMPREHENSIVE METABOLIC PANEL WITH GFR - Abnormal; Notable for the following components:      Result Value   Sodium 128 (*)    Potassium 5.6 (*)    Chloride 94 (*)    Glucose, Bld 100 (*)    BUN 38 (*)    Creatinine, Ser 1.91 (*)    Calcium 8.8 (*)    Albumin 1.8 (*)    AST 550 (*)    ALT 171 (*)    Alkaline Phosphatase 1,489 (*)    Total Bilirubin 11.3 (*)    GFR, Estimated 27 (*)    All other components within normal limits  CBC WITH DIFFERENTIAL/PLATELET - Abnormal; Notable for the following components:   WBC 17.8 (*)    RBC 2.88 (*)    Hemoglobin 9.0 (*)    HCT 27.5 (*)    RDW 16.6 (*)    Neutro Abs 16.4 (*)    Lymphs Abs 0.4 (*)    Abs Immature Granulocytes 0.17 (*)    All other components within normal limits  URINALYSIS, ROUTINE W REFLEX MICROSCOPIC - Abnormal; Notable for the following components:   Color, Urine AMBER (*)    APPearance CLOUDY (*)    Hgb urine dipstick LARGE (*)    Protein, ur 100 (*)    Leukocytes,Ua LARGE (*)    Bacteria, UA MANY (*)    All other components within normal limits  PROTIME-INR - Abnormal; Notable for the following components:   Prothrombin Time 15.3 (*)    All other components within normal limits  RAPID URINE DRUG SCREEN, HOSP PERFORMED - Abnormal;  Notable for the following components:   Opiates POSITIVE (*)    All other components within normal limits  BLOOD GAS, VENOUS - Abnormal; Notable for the following components:   pO2, Ven <31 (*)    All other components within normal limits  ACETAMINOPHEN LEVEL - Abnormal; Notable for the following components:   Acetaminophen (Tylenol), Serum <10 (*)    All other components within normal limits  SALICYLATE LEVEL - Abnormal; Notable for the following components:   Salicylate Lvl <7.0 (*)    All other components within normal limits  CBG MONITORING, ED - Abnormal; Notable for the following components:   Glucose-Capillary 46 (*)    All other components within normal limits  CBG MONITORING, ED - Abnormal; Notable for the following components:   Glucose-Capillary 42 (*)    All other components within normal limits  CBG MONITORING, ED - Abnormal; Notable for the following components:   Glucose-Capillary 136 (*)    All other components within normal limits  URINE CULTURE  CULTURE, BLOOD (ROUTINE X 2)  CULTURE, BLOOD (ROUTINE X 2)  LIPASE, BLOOD  AMMONIA    EKG EKG Interpretation Date/Time:  Tuesday March 08 2024 15:59:44 EDT Ventricular Rate:  84 PR Interval:  125 QRS Duration:  169 QT Interval:  418 QTC Calculation: 495 R Axis:   -54  Text Interpretation: Sinus rhythm Nonspecific IVCD with LAD Confirmed by Gloris Manchester ([id number]) on 03/08/2024 4:59:37 PM  Radiology CT CHEST ABDOMEN PELVIS WO CONTRAST Result Date: 03/08/2024 CLINICAL DATA:  History of metastatic breast cancer with weakness, body aches, and lethargy associated  with nausea and vomiting. * Tracking Code: BO * EXAM: CT CHEST, ABDOMEN AND PELVIS WITHOUT CONTRAST TECHNIQUE: Multidetector CT imaging of the chest, abdomen and pelvis was performed following the standard protocol without IV contrast. RADIATION DOSE REDUCTION: This exam was performed according to the departmental dose-optimization program which includes automated  exposure control, adjustment of the mA and/or kV according to patient size and/or use of iterative reconstruction technique. COMPARISON:  CT chest, abdomen, and pelvis dated 01/01/2024 FINDINGS: CT CHEST FINDINGS Cardiovascular: Normal heart size. No significant pericardial fluid/thickening. Great vessels are normal in course and caliber. Coronary artery calcifications. Mediastinum/Nodes: Imaged thyroid gland without nodules meeting criteria for imaging follow-up by size. Normal esophagus. No pathologically enlarged axillary, supraclavicular, mediastinal, or hilar lymph nodes. Lungs/Pleura: The central airways are patent. Adherent secretions within the trachea. Unchanged irregular right apical nodule measuring 7 x 6 mm (3:34). No new or enlarging pulmonary nodules. No pneumothorax. No pleural effusion. Musculoskeletal: No acute or abnormal lytic or blastic osseous lesions. Multilevel degenerative changes of the thoracic spine. Unchanged calcified mass in the right breast. Punctate foci of gas within the right anterior chest appears anti dependent within small vessels in the right subclavian vein. CT ABDOMEN PELVIS FINDINGS Hepatobiliary: No focal hepatic lesions. Mild intrahepatic bile duct dilation, which may be related to cholecystectomy. Trace pneumobilia at the hepatic hilum. Pancreas: No focal lesions or main ductal dilation. Spleen: Normal in size without focal abnormality. Adrenals/Urinary Tract: No adrenal nodules. No calculi or hydronephrosis. Left upper pole renal mass, better evaluated on prior contrast-enhanced examination. No focal bladder wall thickening. Stomach/Bowel: Normal appearance of the stomach. No evidence of bowel wall thickening, distention, or inflammatory changes. Large volume stool within the rectum. Moderate volume stool within the descending and sigmoid colon. Appendix is not discretely seen. Vascular/Lymphatic: Aortic atherosclerosis. No enlarged abdominal or pelvic lymph nodes.  Reproductive: No adnexal masses. Other: No free fluid, fluid collection, or free air. Calcified nodule along the left anterior peritoneum, likely sequela of prior infection/inflammation. Musculoskeletal: Similar sclerotic lesion at L1-2 with L1 compression fracture and retropulsion of fracture fragments. Unchanged cortical irregularity of left inferior pubic ramus. Multilevel degenerative changes of the lumbar spine. Again seen is infusion pump within the right anterior abdominal wall. IMPRESSION: 1. No acute abnormality in the chest, abdomen, or pelvis. 2. Unchanged calcified right breast mass and irregular right apical nodule. No new or enlarging pulmonary nodules. 3. Similar sclerotic lesion at L1-2 with L1 compression fracture and retropulsion of fracture fragments. 4. Large volume stool within the rectum. Moderate volume stool within the descending and sigmoid colon. Recommend correlation with constipation. 5. Left upper pole renal mass is better evaluated on prior contrast-enhanced examination. 6. Aortic Atherosclerosis (ICD10-I70.0). Coronary artery calcifications. Assessment for potential risk factor modification, dietary therapy or pharmacologic therapy may be warranted, if clinically indicated. Electronically Signed   By: Agustin Cree M.D.   On: 03/08/2024 20:56   CT Head Wo Contrast Result Date: 03/08/2024 CLINICAL DATA:  Mental status change, unknown cause EXAM: CT HEAD WITHOUT CONTRAST TECHNIQUE: Contiguous axial images were obtained from the base of the skull through the vertex without intravenous contrast. RADIATION DOSE REDUCTION: This exam was performed according to the departmental dose-optimization program which includes automated exposure control, adjustment of the mA and/or kV according to patient size and/or use of iterative reconstruction technique. COMPARISON:  CT head 05/01/2021 FINDINGS: Brain: No evidence of large-territorial acute infarction. No parenchymal hemorrhage. No mass lesion. No  extra-axial collection. No mass effect or midline  shift. No hydrocephalus. Basilar cisterns are patent. Vascular: No hyperdense vessel. Atherosclerotic calcifications are present within the cavernous internal carotid arteries. Skull: No acute fracture or focal lesion. Sinuses/Orbits: Paranasal sinuses and mastoid air cells are clear. The orbits are unremarkable. Other: None. IMPRESSION: No acute intracranial abnormality. Electronically Signed   By: Tish Frederickson M.D.   On: 03/08/2024 20:09   US Abdomen Limited Result Date: 03/08/2024 CLINICAL DATA:  Jaundice EXAM: ULTRASOUND ABDOMEN LIMITED RIGHT UPPER QUADRANT COMPARISON:  CT abdomen pelvis 03/08/2024, CT abdomen pelvis 01/01/2024 FINDINGS: Gallbladder: Status post cholecystectomy. Common bile duct: Diameter: Thickened measuring up to 14 mm with associated echogenic avascular material within the common bile duct. Liver: No focal lesion identified. Within normal limits in parenchymal echogenicity. Portal vein is patent on color Doppler imaging with normal direction of blood flow towards the liver. Pneumobilia noted. Intrahepatic ducts measure at the upper limits of normal. Other: None. IMPRESSION: 1. Thickened common bile duct with echogenic debris within the lumen. Intrahepatic ducts measure at the upper limits of normal. Finding could be sludge versus less likely a mass (given no vasculature identified). When the patient is clinically stable and able to follow directions and hold their breath (preferably as an outpatient) further evaluation with dedicated abdominal MRI should be considered. 2. Status post cholecystectomy. Electronically Signed   By: Tish Frederickson M.D.   On: 03/08/2024 20:07    Procedures Procedures    Medications Ordered in ED Medications  ondansetron (ZOFRAN) injection 4 mg (has no administration in time range)  HYDROmorphone (DILAUDID) injection 0.5 mg (has no administration in time range)  sodium chloride 0.9 % bolus 500 mL (0  mLs Intravenous Hold 03/08/24 1855)  piperacillin-tazobactam (ZOSYN) IVPB 3.375 g (3.375 g Intravenous New Bag/Given 03/08/24 1935)  piperacillin-tazobactam (ZOSYN) IVPB 3.375 g (has no administration in time range)  lactated ringers bolus 1,000 mL (1,000 mLs Intravenous New Bag/Given 03/08/24 1637)  dextrose 50 % solution 50 mL (50 mLs Intravenous Given 03/08/24 1633)  hydrocortisone sodium succinate (SOLU-CORTEF) 100 MG injection 100 mg (100 mg Intravenous Given 03/08/24 1716)  albuterol (PROVENTIL) (2.5 MG/3ML) 0.083% nebulizer solution 2.5 mg (2.5 mg Nebulization Given 03/08/24 1732)  dextrose 50 % solution 50 mL (50 mLs Intravenous Given 03/08/24 1751)  dextrose 10 % infusion ( Intravenous New Bag/Given 03/08/24 1800)  gadobutrol (GADAVIST) 1 MMOL/ML injection 9 mL (9 mLs Intravenous Contrast Given 03/08/24 2255)    ED Course/ Medical Decision Making/ A&P                                 Medical Decision Making Amount and/or Complexity of Data Reviewed Labs: ordered. Radiology: ordered.  Risk Prescription drug management. Decision regarding hospitalization.   This patient presents to the ED for concern of altered mental status, this involves an extensive number of treatment options, and is a complaint that carries with it a high risk of complications and morbidity.  The differential diagnosis includes progression of metastatic disease, polypharmacy, medication withdrawal, infection, metabolic derangements   Co morbidities that complicate the patient evaluation  HTN, CAD, CHF, COPD, atrial fibrillation, anxiety, depression, metastatic breast cancer   Additional history obtained:  Additional history obtained from patient's family External records from outside source obtained and reviewed including EMR   Lab Tests:  I Ordered, and personally interpreted labs.  The pertinent results include: AKI is present; increase in hepatobiliary enzymes concerning for cholestasis; hypoglycemia,  hyperkalemia and hyponatremia present  concerning for adrenal insufficiency; urinalysis shows UTI.  Leukocytosis is present.  Anemia is baseline.   Imaging Studies ordered:  I ordered imaging studies including CT of head, chest, abdomen, pelvis; right upper quadrant ultrasound I independently visualized and interpreted imaging which showed ultrasound showed ductal dilatation at upper limits of normal with what appears to be ductal sludge.  CT imaging shows unchanged breast mass and redemonstration of known sclerotic lesions in lumbar spine.  She does have large volume of stool consistent with constipation. I agree with the radiologist interpretation   Cardiac Monitoring: / EKG:  The patient was maintained on a cardiac monitor.  I personally viewed and interpreted the cardiac monitored which showed an underlying rhythm of: Sinus rhythm   Problem List / ED Course / Critical interventions / Medication management  Patient presenting for multiple concerns.  Son noticed acute onset of confusion this morning.  She has had new onset of jaundice sometime within the past week.  She has ongoing diffuse pain that has been uncontrolled with her home pain regimen.  On arrival in the ED, vital signs are normal.  On exam, patient has severe jaundice.  She endorses ongoing diffuse pain that is not more prominent in any 1 area.  Her abdomen is soft and nontender.  Lab work is notable for elevations in hepatobiliary enzymes concerning for cholestasis.  These elevations were present on lab work from 2 weeks ago.  Elevations had increased.  Lab work also notable for AKI with azotemia suggestive of prerenal etiology.  This is consistent with history of poor p.o. intake.  Her sodium is low and potassium is high.  This accommodation with what was persistent hypoglycemia while in the ED, is concerning for adrenal insufficiency.  Dose of Solu-Cortef was ordered.  While imaging was pending, patient was given dose of Zosyn for  empiric treatment of possible hepatobiliary infection.  She was found to have UTI.  Urine cultures were ordered.  On imaging studies, there was no clear biliary obstruction.  MRCP was ordered to further evaluate.  I spoke with gastroenterologist on-call, who recommends admission here at [facility].  They will follow in consult and follow-up on results of MRCP to coordinate with specialist in [facility] as needed if patient does need ERCP or stenting.  Patient was admitted for further management. I ordered medication including dextrose containing fluids for hypoglycemia; Solu-Cortef for empiric treatment of adrenal insufficiency; IV fluids for hydration; albuterol for hyperkalemia; Zosyn for empiric treatment of possible hepatobiliary infection/UTI; Dilaudid for analgesia; Zofran for nausea Reevaluation of the patient after these medicines showed that the patient improved I have reviewed the patients home medicines and have made adjustments as needed   Consultations Obtained:  I requested consultation with the gastroenterologist, Dr. Levon Hedger,  and discussed lab and imaging findings as well as pertinent plan - they recommend: Agree with MRCP, admission any pain, GI will follow in consult   Social Determinants of Health:  Bedbound at baseline, lives at home with son/caretaker  CRITICAL CARE Performed by: Gloris Manchester   Total critical care time: 35 minutes  Critical care time was exclusive of separately billable procedures and treating other patients.  Critical care was necessary to treat or prevent imminent or life-threatening deterioration.  Critical care was time spent personally by me on the following activities: development of treatment plan with patient and/or surrogate as well as nursing, discussions with consultants, evaluation of patient's response to treatment, examination of patient, obtaining history from patient or surrogate,  ordering and performing treatments and interventions,  ordering and review of laboratory studies, ordering and review of radiographic studies, pulse oximetry and re-evaluation of patient's condition.         Final Clinical Impression(s) / ED Diagnoses Final diagnoses:  Urinary tract infection with hematuria, site unspecified  AKI (acute kidney injury) (HCC)  Hypoglycemia  Jaundice    Rx / DC Orders ED Discharge Orders     None         Gloris Manchester, MD 03/08/24 2332

## 2024-03-09 ENCOUNTER — Inpatient Hospital Stay: Admitting: Anesthesiology

## 2024-03-09 ENCOUNTER — Inpatient Hospital Stay
Admission: EM | Admit: 2024-03-09 | Discharge: 2024-03-18 | DRG: 444 | Disposition: A | Discharge status: Home Health | Source: Other Acute Inpatient Hospital | Attending: Internal Medicine | Admitting: Internal Medicine

## 2024-03-09 ENCOUNTER — Encounter
Admission: EM | Disposition: A | Discharge status: Home Health | Payer: Self-pay | Source: Other Acute Inpatient Hospital | Attending: Internal Medicine

## 2024-03-09 ENCOUNTER — Inpatient Hospital Stay

## 2024-03-09 ENCOUNTER — Encounter (HOSPITAL_COMMUNITY): Payer: Self-pay

## 2024-03-09 ENCOUNTER — Encounter (HOSPITAL_COMMUNITY): Payer: Self-pay | Admitting: Internal Medicine

## 2024-03-09 ENCOUNTER — Other Ambulatory Visit: Payer: Self-pay

## 2024-03-09 ENCOUNTER — Encounter: Payer: Self-pay | Admitting: Internal Medicine

## 2024-03-09 DIAGNOSIS — C7951 Secondary malignant neoplasm of bone: Secondary | ICD-10-CM | POA: Diagnosis present

## 2024-03-09 DIAGNOSIS — K831 Obstruction of bile duct: Secondary | ICD-10-CM | POA: Diagnosis present

## 2024-03-09 DIAGNOSIS — D649 Anemia, unspecified: Secondary | ICD-10-CM | POA: Diagnosis not present

## 2024-03-09 DIAGNOSIS — E875 Hyperkalemia: Secondary | ICD-10-CM | POA: Diagnosis present

## 2024-03-09 DIAGNOSIS — G9341 Metabolic encephalopathy: Secondary | ICD-10-CM | POA: Diagnosis present

## 2024-03-09 DIAGNOSIS — N390 Urinary tract infection, site not specified: Secondary | ICD-10-CM | POA: Insufficient documentation

## 2024-03-09 DIAGNOSIS — Z993 Dependence on wheelchair: Secondary | ICD-10-CM

## 2024-03-09 DIAGNOSIS — K264 Chronic or unspecified duodenal ulcer with hemorrhage: Secondary | ICD-10-CM | POA: Diagnosis present

## 2024-03-09 DIAGNOSIS — R7401 Elevation of levels of liver transaminase levels: Secondary | ICD-10-CM | POA: Diagnosis present

## 2024-03-09 DIAGNOSIS — Z66 Do not resuscitate: Secondary | ICD-10-CM | POA: Diagnosis present

## 2024-03-09 DIAGNOSIS — G893 Neoplasm related pain (acute) (chronic): Secondary | ICD-10-CM | POA: Diagnosis present

## 2024-03-09 DIAGNOSIS — Z8249 Family history of ischemic heart disease and other diseases of the circulatory system: Secondary | ICD-10-CM

## 2024-03-09 DIAGNOSIS — E860 Dehydration: Secondary | ICD-10-CM | POA: Diagnosis present

## 2024-03-09 DIAGNOSIS — Z515 Encounter for palliative care: Secondary | ICD-10-CM | POA: Diagnosis not present

## 2024-03-09 DIAGNOSIS — C50911 Malignant neoplasm of unspecified site of right female breast: Secondary | ICD-10-CM | POA: Diagnosis present

## 2024-03-09 DIAGNOSIS — Z955 Presence of coronary angioplasty implant and graft: Secondary | ICD-10-CM

## 2024-03-09 DIAGNOSIS — N170 Acute kidney failure with tubular necrosis: Secondary | ICD-10-CM | POA: Diagnosis present

## 2024-03-09 DIAGNOSIS — B9681 Helicobacter pylori [H. pylori] as the cause of diseases classified elsewhere: Secondary | ICD-10-CM | POA: Diagnosis not present

## 2024-03-09 DIAGNOSIS — K8309 Other cholangitis: Secondary | ICD-10-CM | POA: Diagnosis present

## 2024-03-09 DIAGNOSIS — K838 Other specified diseases of biliary tract: Secondary | ICD-10-CM

## 2024-03-09 DIAGNOSIS — Z825 Family history of asthma and other chronic lower respiratory diseases: Secondary | ICD-10-CM

## 2024-03-09 DIAGNOSIS — Z79899 Other long term (current) drug therapy: Secondary | ICD-10-CM

## 2024-03-09 DIAGNOSIS — G894 Chronic pain syndrome: Secondary | ICD-10-CM | POA: Diagnosis present

## 2024-03-09 DIAGNOSIS — R8281 Pyuria: Secondary | ICD-10-CM | POA: Diagnosis not present

## 2024-03-09 DIAGNOSIS — E66812 Obesity, class 2: Secondary | ICD-10-CM | POA: Diagnosis present

## 2024-03-09 DIAGNOSIS — Z7189 Other specified counseling: Secondary | ICD-10-CM | POA: Diagnosis not present

## 2024-03-09 DIAGNOSIS — I110 Hypertensive heart disease with heart failure: Secondary | ICD-10-CM | POA: Diagnosis present

## 2024-03-09 DIAGNOSIS — I255 Ischemic cardiomyopathy: Secondary | ICD-10-CM | POA: Diagnosis present

## 2024-03-09 DIAGNOSIS — Z85528 Personal history of other malignant neoplasm of kidney: Secondary | ICD-10-CM

## 2024-03-09 DIAGNOSIS — Z7401 Bed confinement status: Secondary | ICD-10-CM

## 2024-03-09 DIAGNOSIS — C50919 Malignant neoplasm of unspecified site of unspecified female breast: Secondary | ICD-10-CM | POA: Diagnosis present

## 2024-03-09 DIAGNOSIS — E785 Hyperlipidemia, unspecified: Secondary | ICD-10-CM | POA: Diagnosis present

## 2024-03-09 DIAGNOSIS — Z7902 Long term (current) use of antithrombotics/antiplatelets: Secondary | ICD-10-CM

## 2024-03-09 DIAGNOSIS — E8809 Other disorders of plasma-protein metabolism, not elsewhere classified: Secondary | ICD-10-CM | POA: Diagnosis present

## 2024-03-09 DIAGNOSIS — R5383 Other fatigue: Secondary | ICD-10-CM | POA: Diagnosis not present

## 2024-03-09 DIAGNOSIS — Z87891 Personal history of nicotine dependence: Secondary | ICD-10-CM

## 2024-03-09 DIAGNOSIS — N179 Acute kidney failure, unspecified: Secondary | ICD-10-CM | POA: Diagnosis present

## 2024-03-09 DIAGNOSIS — I5032 Chronic diastolic (congestive) heart failure: Secondary | ICD-10-CM | POA: Diagnosis present

## 2024-03-09 DIAGNOSIS — J449 Chronic obstructive pulmonary disease, unspecified: Secondary | ICD-10-CM | POA: Diagnosis present

## 2024-03-09 DIAGNOSIS — D630 Anemia in neoplastic disease: Secondary | ICD-10-CM | POA: Diagnosis present

## 2024-03-09 DIAGNOSIS — Z7901 Long term (current) use of anticoagulants: Secondary | ICD-10-CM

## 2024-03-09 DIAGNOSIS — Z86711 Personal history of pulmonary embolism: Secondary | ICD-10-CM

## 2024-03-09 DIAGNOSIS — K269 Duodenal ulcer, unspecified as acute or chronic, without hemorrhage or perforation: Secondary | ICD-10-CM | POA: Diagnosis not present

## 2024-03-09 DIAGNOSIS — I5042 Chronic combined systolic (congestive) and diastolic (congestive) heart failure: Secondary | ICD-10-CM | POA: Diagnosis present

## 2024-03-09 DIAGNOSIS — G8929 Other chronic pain: Secondary | ICD-10-CM

## 2024-03-09 DIAGNOSIS — I447 Left bundle-branch block, unspecified: Secondary | ICD-10-CM | POA: Diagnosis present

## 2024-03-09 DIAGNOSIS — K315 Obstruction of duodenum: Secondary | ICD-10-CM | POA: Diagnosis present

## 2024-03-09 DIAGNOSIS — R7989 Other specified abnormal findings of blood chemistry: Secondary | ICD-10-CM | POA: Diagnosis not present

## 2024-03-09 DIAGNOSIS — I10 Essential (primary) hypertension: Secondary | ICD-10-CM | POA: Diagnosis present

## 2024-03-09 DIAGNOSIS — E871 Hypo-osmolality and hyponatremia: Secondary | ICD-10-CM

## 2024-03-09 DIAGNOSIS — B9620 Unspecified Escherichia coli [E. coli] as the cause of diseases classified elsewhere: Secondary | ICD-10-CM | POA: Diagnosis present

## 2024-03-09 DIAGNOSIS — E8721 Acute metabolic acidosis: Secondary | ICD-10-CM | POA: Diagnosis present

## 2024-03-09 DIAGNOSIS — I252 Old myocardial infarction: Secondary | ICD-10-CM

## 2024-03-09 DIAGNOSIS — R17 Unspecified jaundice: Secondary | ICD-10-CM | POA: Diagnosis not present

## 2024-03-09 DIAGNOSIS — I2510 Atherosclerotic heart disease of native coronary artery without angina pectoris: Secondary | ICD-10-CM | POA: Diagnosis not present

## 2024-03-09 DIAGNOSIS — I480 Paroxysmal atrial fibrillation: Secondary | ICD-10-CM | POA: Diagnosis present

## 2024-03-09 DIAGNOSIS — Z888 Allergy status to other drugs, medicaments and biological substances status: Secondary | ICD-10-CM

## 2024-03-09 DIAGNOSIS — C7902 Secondary malignant neoplasm of left kidney and renal pelvis: Secondary | ICD-10-CM | POA: Diagnosis present

## 2024-03-09 DIAGNOSIS — Z6839 Body mass index (BMI) 39.0-39.9, adult: Secondary | ICD-10-CM

## 2024-03-09 HISTORY — PX: ERCP: SHX5425

## 2024-03-09 HISTORY — PX: ESOPHAGOGASTRODUODENOSCOPY: SHX5428

## 2024-03-09 HISTORY — PX: BILIARY STENT PLACEMENT: SHX5538

## 2024-03-09 LAB — COMPREHENSIVE METABOLIC PANEL WITH GFR
ALT: 160 U/L — ABNORMAL HIGH (ref 0–44)
AST: 509 U/L — ABNORMAL HIGH (ref 15–41)
Albumin: 1.7 g/dL — ABNORMAL LOW (ref 3.5–5.0)
Alkaline Phosphatase: 1466 U/L — ABNORMAL HIGH (ref 38–126)
Anion gap: 12 (ref 5–15)
BUN: 40 mg/dL — ABNORMAL HIGH (ref 8–23)
CO2: 21 mmol/L — ABNORMAL LOW (ref 22–32)
Calcium: 8.3 mg/dL — ABNORMAL LOW (ref 8.9–10.3)
Chloride: 93 mmol/L — ABNORMAL LOW (ref 98–111)
Creatinine, Ser: 1.99 mg/dL — ABNORMAL HIGH (ref 0.44–1.00)
GFR, Estimated: 25 mL/min — ABNORMAL LOW (ref >60)
Glucose, Bld: 159 mg/dL — ABNORMAL HIGH (ref 70–99)
Potassium: 4.8 mmol/L (ref 3.5–5.1)
Sodium: 126 mmol/L — ABNORMAL LOW (ref 135–145)
Total Bilirubin: 11.4 mg/dL — ABNORMAL HIGH (ref 0.0–1.2)
Total Protein: 6.6 g/dL (ref 6.5–8.1)

## 2024-03-09 LAB — HEPATITIS PANEL, ACUTE
HCV Ab: NONREACTIVE
Hep A IgM: NONREACTIVE
Hep B C IgM: NONREACTIVE
Hepatitis B Surface Ag: NONREACTIVE

## 2024-03-09 LAB — CBC
HCT: 28.2 % — ABNORMAL LOW (ref 36.0–46.0)
Hemoglobin: 9 g/dL — ABNORMAL LOW (ref 12.0–15.0)
MCH: 30.9 pg (ref 26.0–34.0)
MCHC: 31.9 g/dL (ref 30.0–36.0)
MCV: 96.9 fL (ref 80.0–100.0)
Platelets: 244 10*3/uL (ref 150–400)
RBC: 2.91 MIL/uL — ABNORMAL LOW (ref 3.87–5.11)
RDW: 16.2 % — ABNORMAL HIGH (ref 11.5–15.5)
WBC: 15.1 10*3/uL — ABNORMAL HIGH (ref 4.0–10.5)
nRBC: 0 % (ref 0.0–0.2)

## 2024-03-09 LAB — CBG MONITORING, ED: Glucose-Capillary: 174 mg/dL — ABNORMAL HIGH (ref 70–99)

## 2024-03-09 LAB — MAGNESIUM: Magnesium: 2.4 mg/dL (ref 1.7–2.4)

## 2024-03-09 LAB — PHOSPHORUS: Phosphorus: 3.3 mg/dL (ref 2.5–4.6)

## 2024-03-09 SURGERY — ERCP, WITH INTERVENTION IF INDICATED
Anesthesia: General

## 2024-03-09 MED ORDER — SODIUM CHLORIDE 0.9% FLUSH
3.0000 mL | Freq: Two times a day (BID) | INTRAVENOUS | Status: DC
  Administered 2024-03-10 – 2024-03-17 (×15): 3 mL via INTRAVENOUS

## 2024-03-09 MED ORDER — ACETAMINOPHEN 650 MG RE SUPP: 650.0000 mg | Freq: Four times a day (QID) | RECTAL | Status: DC | PRN

## 2024-03-09 MED ORDER — SUCCINYLCHOLINE CHLORIDE 200 MG/10ML IV SOSY
PREFILLED_SYRINGE | INTRAVENOUS | Status: DC | PRN
  Administered 2024-03-09: 100 mg via INTRAVENOUS

## 2024-03-09 MED ORDER — PROPOFOL 10 MG/ML IV BOLUS
INTRAVENOUS | Status: DC | PRN
  Administered 2024-03-09: 100 mg via INTRAVENOUS

## 2024-03-09 MED ORDER — ONDANSETRON HCL 4 MG/2ML IJ SOLN: 4.0000 mg | Freq: Four times a day (QID) | INTRAMUSCULAR | Status: DC | PRN

## 2024-03-09 MED ORDER — ONDANSETRON HCL 4 MG/2ML IJ SOLN
INTRAMUSCULAR | Status: DC | PRN
  Administered 2024-03-09: 4 mg via INTRAVENOUS

## 2024-03-09 MED ORDER — LACTATED RINGERS IV SOLN: 80.0000 mL | INTRAVENOUS | Status: DC

## 2024-03-09 MED ORDER — HYDROMORPHONE HCL 2 MG PO TABS
2.0000 mg | ORAL_TABLET | Freq: Four times a day (QID) | ORAL | Status: DC | PRN
  Administered 2024-03-10 (×2): 2 mg via ORAL
  Administered 2024-03-11 – 2024-03-14 (×7): 4 mg via ORAL
  Administered 2024-03-16 – 2024-03-18 (×3): 2 mg via ORAL
  Filled 2024-03-09 (×3): qty 1
  Filled 2024-03-09 (×2): qty 2
  Filled 2024-03-09: qty 1
  Filled 2024-03-09: qty 2
  Filled 2024-03-09: qty 1
  Filled 2024-03-09 (×4): qty 2

## 2024-03-09 MED ORDER — ONDANSETRON HCL 4 MG PO TABS: 4.0000 mg | ORAL_TABLET | Freq: Four times a day (QID) | ORAL | Status: DC | PRN

## 2024-03-09 MED ORDER — IPRATROPIUM-ALBUTEROL 0.5-2.5 (3) MG/3ML IN SOLN: 3.0000 mL | RESPIRATORY_TRACT | Status: DC | PRN

## 2024-03-09 MED ORDER — DEXAMETHASONE SODIUM PHOSPHATE 10 MG/ML IJ SOLN
INTRAMUSCULAR | Status: DC | PRN
  Administered 2024-03-09: 5 mg via INTRAVENOUS

## 2024-03-09 MED ORDER — PIPERACILLIN-TAZOBACTAM 3.375 G IVPB: 3.3750 g | Freq: Three times a day (TID) | INTRAVENOUS | Status: DC

## 2024-03-09 MED ORDER — HYDROMORPHONE HCL 1 MG/ML IJ SOLN
0.5000 mg | INTRAMUSCULAR | Status: DC | PRN
  Administered 2024-03-11 – 2024-03-18 (×13): 0.5 mg via INTRAVENOUS
  Filled 2024-03-09 (×14): qty 0.5

## 2024-03-09 MED ORDER — LACTATED RINGERS IV SOLN: INTRAVENOUS | Status: DC

## 2024-03-09 MED ORDER — PREGABALIN 50 MG PO CAPS: 50.0000 mg | ORAL_CAPSULE | Freq: Every day | ORAL | Status: DC | PRN

## 2024-03-09 MED ORDER — HYDROMORPHONE HCL 1 MG/ML IJ SOLN: 0.5000 mg | INTRAMUSCULAR | Status: DC | PRN

## 2024-03-09 MED ORDER — ACETAMINOPHEN 325 MG PO TABS: 650.0000 mg | ORAL_TABLET | Freq: Four times a day (QID) | ORAL | Status: DC | PRN

## 2024-03-09 MED ORDER — PIPERACILLIN-TAZOBACTAM 3.375 G IVPB
3.3750 g | Freq: Three times a day (TID) | INTRAVENOUS | Status: DC
  Administered 2024-03-09 – 2024-03-11 (×5): 3.375 g via INTRAVENOUS
  Filled 2024-03-09 (×5): qty 50

## 2024-03-09 MED ORDER — ONDANSETRON HCL 4 MG/2ML IJ SOLN
INTRAMUSCULAR | Status: AC
  Filled 2024-03-09: qty 2

## 2024-03-09 MED ORDER — FENTANYL CITRATE (PF) 100 MCG/2ML IJ SOLN
INTRAMUSCULAR | Status: AC
  Filled 2024-03-09: qty 2

## 2024-03-09 MED ORDER — METHADONE HCL 10 MG PO TABS: 5.0000 mg | ORAL_TABLET | Freq: Three times a day (TID) | ORAL | Status: DC

## 2024-03-09 MED ORDER — LACTATED RINGERS IV SOLN: INTRAVENOUS | Status: AC

## 2024-03-09 MED ORDER — DEXAMETHASONE SODIUM PHOSPHATE 10 MG/ML IJ SOLN
INTRAMUSCULAR | Status: AC
  Filled 2024-03-09: qty 1

## 2024-03-09 MED ORDER — LACTATED RINGERS IV SOLN
INTRAVENOUS | Status: AC
Start: 2024-03-09 — End: 2024-03-09

## 2024-03-09 MED ORDER — ONDANSETRON HCL 4 MG/2ML IJ SOLN
4.0000 mg | Freq: Four times a day (QID) | INTRAMUSCULAR | Status: DC | PRN
  Administered 2024-03-13: 4 mg via INTRAVENOUS
  Filled 2024-03-09: qty 2

## 2024-03-09 MED ORDER — APIXABAN 5 MG PO TABS: 5.0000 mg | ORAL_TABLET | Freq: Two times a day (BID) | ORAL | Status: DC

## 2024-03-09 MED ORDER — FENTANYL CITRATE (PF) 100 MCG/2ML IJ SOLN
INTRAMUSCULAR | Status: DC | PRN
  Administered 2024-03-09 (×2): 25 ug via INTRAVENOUS
  Administered 2024-03-09: 50 ug via INTRAVENOUS

## 2024-03-09 NOTE — Discharge Summary (Addendum)
 Physician Discharge Summary   Patient: Stacey [name] MRN: [id number] DOB: Oct 08, 1946  Admit date:     03/08/2024  Discharge date: 03/09/24  Discharge Physician: Meredeth Ide   PCP: Ignatius Specking, MD   Recommendations at discharge:   Patient to be [facility] in [facility], ERCP  is not available at [facility] at this time. Gastroenterology Dr. Servando Snare will do procedure today.  Discharge Diagnoses: Principal Problem:   Acute metabolic encephalopathy Active Problems:   Essential hypertension   Transaminitis   Mixed hyperlipidemia   Chronic pain   Leukocytosis   Jaundice   UTI (urinary tract infection)   AKI (acute kidney injury) (HCC)  Resolved Problems:   * No resolved hospital problems. *  Hospital Course: 78 y.o. female with medical history significant of hypertension, hyperlipidemia, CAD, breast cancer (follows with Dr. Ellin Saba) who presents to the emergency department due to altered mental status.  At bedside, patient was somnolent and though arousable, but quickly goes back to sleep and was unable to provide history.  History was obtained from ED physician and ED medical record.  Per report, patient's son who lives with her noted that she was more confused this morning.  Patient's sister also noted an onset of jaundice inpatient since Sunday (4/6).  Patient was reported to complain of generalized bodyaches despite being on Dilaudid, Voltaren gel, Lyrica, morphine given through intrathecal pump.  CT chest, abdomen and pelvis without contrast showed no acute abnormality in the chest, abdomen or pelvis. CT head without contrast showed no acute intracranial abnormality RUQ ultrasound was suggestive to be sludge versus less likely a mass Albuterol was given, D50 was provided due to a momentary hypoglycemia.  Solu-Cortef was also given. Zosyn was given empirically for possible hepatobiliary infection. Gastroenterologist on-call (Dr. Levon Hedger) was consulted  and recommended admission with plan to follow-up on patient after labs to follow-up on MRCP ordered to determine if patient requires ERCP Assessment and Plan:  Obstructive jaundice/transaminitis - Patient presented with transaminitis, jaundice with AST of 550, ALT 171, alk phos 1849.  MRCP obtained shows both intra and extrahepatic ductal dilatationwith periportal edema, increased when compared to prior contrast enhanced CT dated 01/01/2024. The central common bile duct is very difficult to assess due to artifact and image quality but appears to sharply narrow within the pancreatic head. Findings are highly concerning for obstructing calculus or mass.  Patient will need ERCP however no facility available at  [facility] at this time. Gastroenterology at [facility] hospital discussed with Dr. Daleen Squibb at [facility].  He has agreed for transfer to [facility] for ERCP. Will initiate process for transfer Continue empiric antibiotics with Zosyn, IV fluids, Dilaudid as needed.  Acute kidney injury BUN/creatinine elevated 40/1.99.  CT abdomen/pelvis is unremarkable.  Likely in setting of poor p.o. intake and dehydration.  Started on IV fluids. Will need to recheck BUN/creatinine in a.m.  Hypertension -Blood pressure medication on hold due to soft blood pressure  Hyperlipidemia - Statins on hold due to transaminitis  Chronic pain syndrome - Continue Dilaudid as needed  Renal cell carcinoma - Heterogeneously enhancing mass in the peripheral superior pole of the left kidney measuring 2.7 x 2.5 cm consistent with renal cell carcinoma. - Will need follow-up with urology/oncology       Consultants: Gastroenterology Procedures performed:  Disposition: [facility] Diet recommendation:  Discharge Diet Orders (From admission, onward)     Start     Ordered   03/09/24 0000  Diet - low sodium heart healthy        03/09/24 0957            DISCHARGE MEDICATION: Allergies as of 03/09/2024       Reactions    Brilinta [ticagrelor] Diarrhea, Nausea Only   Nausea and severe diarrhea- general weakness. Patient says does not want to take again   Budesonide Palpitations   Albuterol Other (See Comments)   Heart racing        Medication List     STOP taking these medications    acetaminophen 325 MG tablet Commonly known as: TYLENOL   amLODipine 5 MG tablet Commonly known as: NORVASC   apixaban 5 MG Tabs tablet Commonly known as: ELIQUIS   atorvastatin 80 MG tablet Commonly known as: LIPITOR   b complex vitamins tablet   carboxymethylcellulose 0.5 % Soln Commonly known as: REFRESH PLUS   clopidogrel 75 MG tablet Commonly known as: PLAVIX   DULoxetine 30 MG capsule Commonly known as: CYMBALTA   EQ VISION FORMULA 50+ PO   ferrous sulfate 325 (65 FE) MG tablet   HYDROmorphone 2 MG tablet Commonly known as: Dilaudid Replaced by: HYDROmorphone 1 MG/ML injection   lidocaine 5 % Commonly known as: Lidoderm   losartan 100 MG tablet Commonly known as: COZAAR   methadone 5 MG tablet Commonly known as: DOLOPHINE   methocarbamol 500 MG tablet Commonly known as: ROBAXIN   metoprolol succinate 100 MG 24 hr tablet Commonly known as: TOPROL-XL   MULTIVITAMIN ADULT PO   naloxone 4 MG/0.1ML Liqd nasal spray kit Commonly known as: NARCAN   nitroGLYCERIN 0.4 MG SL tablet Commonly known as: Nitrostat   oxyCODONE 5 MG immediate release tablet Commonly known as: Oxy IR/ROXICODONE   Oxycodone HCl 10 MG Tabs   PAIN MANAGEMENT INTRATHECAL (IT) PUMP   pregabalin 50 MG capsule Commonly known as: LYRICA       TAKE these medications    HYDROmorphone 1 MG/ML injection Commonly known as: DILAUDID Inject 0.5 mLs (0.5 mg total) into the vein every 2 (two) hours as needed for severe pain (pain score 7-10) or moderate pain (pain score 4-6). Replaces: HYDROmorphone 2 MG tablet   lactated ringers infusion Inject 80 mLs into the vein continuous.   ondansetron 4 MG/2ML Soln  injection Commonly known as: ZOFRAN Inject 2 mLs (4 mg total) into the vein every 6 (six) hours as needed for nausea or vomiting.   piperacillin-tazobactam 3.375 GM/50ML IVPB Commonly known as: ZOSYN Inject 50 mLs (3.375 g total) into the vein every 8 (eight) hours.        Discharge Exam: Filed Weights   03/08/24 1956  Weight: 90.1 kg   General-appears in no acute distress Heart-S1-S2, regular, no murmur auscultated Lungs-clear to auscultation bilaterally, no wheezing or crackles auscultated Abdomen-soft, nontender, no organomegaly Extremities-no edema in the lower extremities Neuro-alert, oriented x3, no focal deficit noted  Condition at discharge: good  The results of significant diagnostics from this hospitalization (including imaging, microbiology, ancillary and laboratory) are listed below for reference.   Imaging Studies: MR ABDOMEN MRCP W WO CONTAST Result Date: 03/09/2024 CLINICAL DATA:  Jaundice, metastatic breast cancer EXAM: MRI ABDOMEN WITHOUT AND WITH CONTRAST (INCLUDING MRCP) TECHNIQUE: Multiplanar multisequence MR imaging of the abdomen was performed both before and after the administration of intravenous contrast. Heavily T2-weighted images of the biliary and pancreatic ducts were obtained, and three-dimensional MRCP images were rendered by post processing. CONTRAST:  9mL GADAVIST GADOBUTROL 1 MMOL/ML IV SOLN  COMPARISON:  CT chest abdomen pelvis, 03/08/2024, 01/01/2024 FINDINGS: Examination is unfortunately very limited by breath motion artifact as well as susceptibility and saturation artifact arising from a subcutaneous pain pump in the soft tissues of the right upper quadrant. Lower chest: No acute abnormality. Calcified mass in the right breast (series 5, image 4). Hepatobiliary: No focal liver abnormality is seen. Status post cholecystectomy. Severe intra and extrahepatic biliary ductal dilatation with periportal edema. This appears to be increased when compared to  prior contrast enhanced CT dated 01/01/2024. The central common bile duct is very difficult to assess due to artifact and image quality but appears to sharply narrow within the pancreatic head (series 15, image 49). Pancreas: Unremarkable. No pancreatic ductal dilatation or surrounding inflammatory changes. Spleen: Normal in size without significant abnormality. Adrenals/Urinary Tract: Adrenal glands are unremarkable. Heterogeneously enhancing mass in the peripheral superior pole of the left kidney measuring 2.7 x 2.5 cm (series 15, image 37). Right kidney is normal, without obvious renal calculi, solid lesion, or hydronephrosis. Stomach/Bowel: Stomach is within normal limits. No evidence of bowel wall thickening, distention, or inflammatory changes. Vascular/Lymphatic: Severe aortic atherosclerosis. No enlarged abdominal lymph nodes. Other: No abdominal wall hernia or abnormality. No ascites. Musculoskeletal: No acute or significant osseous findings. IMPRESSION: 1. Examination is unfortunately very limited by breath motion artifact as well as susceptibility and saturation artifact arising from a subcutaneous pain pump in the soft tissues of the right upper quadrant. 2. Within this limitation, severe intra and extrahepatic biliary ductal dilatation with periportal edema. This appears to be increased when compared to prior contrast enhanced CT dated 01/01/2024. The central common bile duct is very difficult to assess due to artifact and image quality but appears to sharply narrow within the pancreatic head. Findings are highly concerning for obstructing calculus or mass; repeat contrast enhanced CT may be helpful to better assess given severe technical limitations of this MR. 3. Heterogeneously enhancing mass in the peripheral superior pole of the left kidney measuring 2.7 x 2.5 cm, consistent with renal cell carcinoma. Aortic Atherosclerosis (ICD10-I70.0). Electronically Signed   By: Jearld Lesch M.D.   On:  03/09/2024 07:46   MR 3D Recon At Scanner Result Date: 03/09/2024 CLINICAL DATA:  Jaundice, metastatic breast cancer EXAM: MRI ABDOMEN WITHOUT AND WITH CONTRAST (INCLUDING MRCP) TECHNIQUE: Multiplanar multisequence MR imaging of the abdomen was performed both before and after the administration of intravenous contrast. Heavily T2-weighted images of the biliary and pancreatic ducts were obtained, and three-dimensional MRCP images were rendered by post processing. CONTRAST:  9mL GADAVIST GADOBUTROL 1 MMOL/ML IV SOLN COMPARISON:  CT chest abdomen pelvis, 03/08/2024, 01/01/2024 FINDINGS: Examination is unfortunately very limited by breath motion artifact as well as susceptibility and saturation artifact arising from a subcutaneous pain pump in the soft tissues of the right upper quadrant. Lower chest: No acute abnormality. Calcified mass in the right breast (series 5, image 4). Hepatobiliary: No focal liver abnormality is seen. Status post cholecystectomy. Severe intra and extrahepatic biliary ductal dilatation with periportal edema. This appears to be increased when compared to prior contrast enhanced CT dated 01/01/2024. The central common bile duct is very difficult to assess due to artifact and image quality but appears to sharply narrow within the pancreatic head (series 15, image 49). Pancreas: Unremarkable. No pancreatic ductal dilatation or surrounding inflammatory changes. Spleen: Normal in size without significant abnormality. Adrenals/Urinary Tract: Adrenal glands are unremarkable. Heterogeneously enhancing mass in the peripheral superior pole of the left kidney measuring 2.7 x 2.5  cm (series 15, image 37). Right kidney is normal, without obvious renal calculi, solid lesion, or hydronephrosis. Stomach/Bowel: Stomach is within normal limits. No evidence of bowel wall thickening, distention, or inflammatory changes. Vascular/Lymphatic: Severe aortic atherosclerosis. No enlarged abdominal lymph nodes. Other:  No abdominal wall hernia or abnormality. No ascites. Musculoskeletal: No acute or significant osseous findings. IMPRESSION: 1. Examination is unfortunately very limited by breath motion artifact as well as susceptibility and saturation artifact arising from a subcutaneous pain pump in the soft tissues of the right upper quadrant. 2. Within this limitation, severe intra and extrahepatic biliary ductal dilatation with periportal edema. This appears to be increased when compared to prior contrast enhanced CT dated 01/01/2024. The central common bile duct is very difficult to assess due to artifact and image quality but appears to sharply narrow within the pancreatic head. Findings are highly concerning for obstructing calculus or mass; repeat contrast enhanced CT may be helpful to better assess given severe technical limitations of this MR. 3. Heterogeneously enhancing mass in the peripheral superior pole of the left kidney measuring 2.7 x 2.5 cm, consistent with renal cell carcinoma. Aortic Atherosclerosis (ICD10-I70.0). Electronically Signed   By: Jearld Lesch M.D.   On: 03/09/2024 07:46   CT CHEST ABDOMEN PELVIS WO CONTRAST Result Date: 03/08/2024 CLINICAL DATA:  History of metastatic breast cancer with weakness, body aches, and lethargy associated with nausea and vomiting. * Tracking Code: BO * EXAM: CT CHEST, ABDOMEN AND PELVIS WITHOUT CONTRAST TECHNIQUE: Multidetector CT imaging of the chest, abdomen and pelvis was performed following the standard protocol without IV contrast. RADIATION DOSE REDUCTION: This exam was performed according to the departmental dose-optimization program which includes automated exposure control, adjustment of the mA and/or kV according to patient size and/or use of iterative reconstruction technique. COMPARISON:  CT chest, abdomen, and pelvis dated 01/01/2024 FINDINGS: CT CHEST FINDINGS Cardiovascular: Normal heart size. No significant pericardial fluid/thickening. Great vessels are  normal in course and caliber. Coronary artery calcifications. Mediastinum/Nodes: Imaged thyroid gland without nodules meeting criteria for imaging follow-up by size. Normal esophagus. No pathologically enlarged axillary, supraclavicular, mediastinal, or hilar lymph nodes. Lungs/Pleura: The central airways are patent. Adherent secretions within the trachea. Unchanged irregular right apical nodule measuring 7 x 6 mm (3:34). No new or enlarging pulmonary nodules. No pneumothorax. No pleural effusion. Musculoskeletal: No acute or abnormal lytic or blastic osseous lesions. Multilevel degenerative changes of the thoracic spine. Unchanged calcified mass in the right breast. Punctate foci of gas within the right anterior chest appears anti dependent within small vessels in the right subclavian vein. CT ABDOMEN PELVIS FINDINGS Hepatobiliary: No focal hepatic lesions. Mild intrahepatic bile duct dilation, which may be related to cholecystectomy. Trace pneumobilia at the hepatic hilum. Pancreas: No focal lesions or main ductal dilation. Spleen: Normal in size without focal abnormality. Adrenals/Urinary Tract: No adrenal nodules. No calculi or hydronephrosis. Left upper pole renal mass, better evaluated on prior contrast-enhanced examination. No focal bladder wall thickening. Stomach/Bowel: Normal appearance of the stomach. No evidence of bowel wall thickening, distention, or inflammatory changes. Large volume stool within the rectum. Moderate volume stool within the descending and sigmoid colon. Appendix is not discretely seen. Vascular/Lymphatic: Aortic atherosclerosis. No enlarged abdominal or pelvic lymph nodes. Reproductive: No adnexal masses. Other: No free fluid, fluid collection, or free air. Calcified nodule along the left anterior peritoneum, likely sequela of prior infection/inflammation. Musculoskeletal: Similar sclerotic lesion at L1-2 with L1 compression fracture and retropulsion of fracture fragments. Unchanged  cortical irregularity of left  inferior pubic ramus. Multilevel degenerative changes of the lumbar spine. Again seen is infusion pump within the right anterior abdominal wall. IMPRESSION: 1. No acute abnormality in the chest, abdomen, or pelvis. 2. Unchanged calcified right breast mass and irregular right apical nodule. No new or enlarging pulmonary nodules. 3. Similar sclerotic lesion at L1-2 with L1 compression fracture and retropulsion of fracture fragments. 4. Large volume stool within the rectum. Moderate volume stool within the descending and sigmoid colon. Recommend correlation with constipation. 5. Left upper pole renal mass is better evaluated on prior contrast-enhanced examination. 6. Aortic Atherosclerosis (ICD10-I70.0). Coronary artery calcifications. Assessment for potential risk factor modification, dietary therapy or pharmacologic therapy may be warranted, if clinically indicated. Electronically Signed   By: Agustin Cree M.D.   On: 03/08/2024 20:56   CT Head Wo Contrast Result Date: 03/08/2024 CLINICAL DATA:  Mental status change, unknown cause EXAM: CT HEAD WITHOUT CONTRAST TECHNIQUE: Contiguous axial images were obtained from the base of the skull through the vertex without intravenous contrast. RADIATION DOSE REDUCTION: This exam was performed according to the departmental dose-optimization program which includes automated exposure control, adjustment of the mA and/or kV according to patient size and/or use of iterative reconstruction technique. COMPARISON:  CT head 05/01/2021 FINDINGS: Brain: No evidence of large-territorial acute infarction. No parenchymal hemorrhage. No mass lesion. No extra-axial collection. No mass effect or midline shift. No hydrocephalus. Basilar cisterns are patent. Vascular: No hyperdense vessel. Atherosclerotic calcifications are present within the cavernous internal carotid arteries. Skull: No acute fracture or focal lesion. Sinuses/Orbits: Paranasal sinuses and mastoid  air cells are clear. The orbits are unremarkable. Other: None. IMPRESSION: No acute intracranial abnormality. Electronically Signed   By: Tish Frederickson M.D.   On: 03/08/2024 20:09   US Abdomen Limited Result Date: 03/08/2024 CLINICAL DATA:  Jaundice EXAM: ULTRASOUND ABDOMEN LIMITED RIGHT UPPER QUADRANT COMPARISON:  CT abdomen pelvis 03/08/2024, CT abdomen pelvis 01/01/2024 FINDINGS: Gallbladder: Status post cholecystectomy. Common bile duct: Diameter: Thickened measuring up to 14 mm with associated echogenic avascular material within the common bile duct. Liver: No focal lesion identified. Within normal limits in parenchymal echogenicity. Portal vein is patent on color Doppler imaging with normal direction of blood flow towards the liver. Pneumobilia noted. Intrahepatic ducts measure at the upper limits of normal. Other: None. IMPRESSION: 1. Thickened common bile duct with echogenic debris within the lumen. Intrahepatic ducts measure at the upper limits of normal. Finding could be sludge versus less likely a mass (given no vasculature identified). When the patient is clinically stable and able to follow directions and hold their breath (preferably as an outpatient) further evaluation with dedicated abdominal MRI should be considered. 2. Status post cholecystectomy. Electronically Signed   By: Tish Frederickson M.D.   On: 03/08/2024 20:07    Microbiology: Results for orders placed or performed during the hospital encounter of 03/08/24  Blood culture (routine x 2)     Status: None (Preliminary result)   Collection Time: 03/08/24 11:53 PM   Specimen: BLOOD  Result Value Ref Range Status   Specimen Description BLOOD BLOOD LEFT HAND  Final   Special Requests   Final    BOTTLES DRAWN AEROBIC AND ANAEROBIC Blood Culture adequate volume   Culture   Final    NO GROWTH < 12 HOURS Performed at [facility], [address] [place], [place] [address]    Report Status PENDING  Incomplete  Blood culture (routine  x 2)     Status: None (Preliminary result)  Collection Time: 03/08/24 11:55 PM   Specimen: BLOOD  Result Value Ref Range Status   Specimen Description BLOOD BLOOD LEFT HAND  Final   Special Requests   Final    BOTTLES DRAWN AEROBIC ONLY Blood Culture adequate volume   Culture   Final    NO GROWTH < 12 HOURS Performed at [facility], [address] [place], [place] [address]    Report Status PENDING  Incomplete    Labs: CBC: Recent Labs  Lab 03/08/24 1558 03/09/24 0556  WBC 17.8* 15.1*  NEUTROABS 16.4*  --   HGB 9.0* 9.0*  HCT 27.5* 28.2*  MCV 95.5 96.9  PLT 269 244   Basic Metabolic Panel: Recent Labs  Lab 03/08/24 1558 03/09/24 0556  NA 128* 126*  K 5.6* 4.8  CL 94* 93*  CO2 22 21*  GLUCOSE 100* 159*  BUN 38* 40*  CREATININE 1.91* 1.99*  CALCIUM 8.8* 8.3*  MG  --  2.4  PHOS  --  3.3   Liver Function Tests: Recent Labs  Lab 03/08/24 1558 03/09/24 0556  AST 550* 509*  ALT 171* 160*  ALKPHOS 1,489* 1,466*  BILITOT 11.3* 11.4*  PROT 7.1 6.6  ALBUMIN 1.8* 1.7*   CBG: Recent Labs  Lab 03/08/24 1625 03/08/24 1726 03/08/24 1938 03/09/24 0116  GLUCAP 46* 42* 136* 174*    Discharge time spent: greater than 30 minutes.  Signed: Meredeth Ide, MD [facility] 03/09/2024

## 2024-03-09 NOTE — Progress Notes (Signed)
 Pharmacy Antibiotic Note  Stacey [name] is a 78 y.o. female admitted on 03/09/2024 with  intra-abdominal infection .  Pharmacy has been consulted for Zosyn dosing.  Plan: Start Zosyn 3.375 g IV Q8H Continue to monitor renal function and follow culture results   Height: 5' (152.4 cm) IBW/kg (Calculated) : 45.5  Temp (24hrs), Avg:97.7 F (36.5 C), Min:97.5 F (36.4 C), Max:98 F (36.7 C)  Recent Labs  Lab 03/08/24 1558 03/09/24 0556  WBC 17.8* 15.1*  CREATININE 1.91* 1.99*    Estimated Creatinine Clearance: 22.8 mL/min (A) (by C-G formula based on SCr of 1.99 mg/dL (H)).    Allergies  Allergen Reactions   Brilinta [Ticagrelor] Diarrhea and Nausea Only    Nausea and severe diarrhea- general weakness. Patient says does not want to take again   Budesonide Palpitations   Albuterol Other (See Comments)    Heart racing     Antimicrobials this admission: 4/9 Zosyn >>   Microbiology results: 4/8 BCx: NG<12h 4/8 UCx: IP    Thank you for allowing pharmacy to be a part of this patient's care.  Merryl Hacker, PharmD Clinical Pharmacist  03/09/2024 5:03 PM

## 2024-03-09 NOTE — H&P (Signed)
 History and Physical    Patient: Stacey [name] FAO:[id number] DOB: 01-18-1946 DOA: 03/09/2024 DOS: the patient was seen and examined on 03/09/2024 PCP: Ignatius Specking, MD  Patient coming from: Home  Chief Complaint: No chief complaint on file.  HPI: Stacey [name] is a 78 y.o. 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  History obtained through chart review as patient remains quite sleepy postop. Per chart review, presented to the ED due to several day history of jaundice and subsequent development of confusion.  She had begun to experience nausea, vomiting and bodyaches.  Per chart review, patient has been experiencing some short-term memory loss at baseline and is essentially bedbound for the last year and a half.  Hospital course: On arrival to the ED yesterday, patient was normotensive at 134/78 with heart rate of 75.  She was saturating at 90% on room air.  She was afebrile at 98.  Initial workup notable for WBC of 17.8, hemoglobin of 9.0, sodium 128, BUN 38, creat 1.91, alkaline phosphatase 1489, AST 550, ALT 171, GFR 27.  Urinalysis with large hematuria, leukocytes, many bacteria.  CT of the head with no intracranial abnormality.  CT chest/abdomen/pelvis with no acute abnormality.  MRCP with severe intra and extrahepatic biliary ductal dilation with sharp narrowing common bile duct in the pancreatic head.  Patient was started on Zosyn, IV fluids.  GI consulted and recommendation was made for transfer for ERCP.  Patient was transferred to [facility].  On arrival to [facility], patient's blood pressure was on the lower end of normal at 105/59 with heart rate of 90.  She was  saturating at 92% on room air.  She was afebrile at 97.6.  Review of Systems: As mentioned in the history of present illness. All other systems reviewed and are negative.  Past Medical History:  Diagnosis Date   CAD in native artery    a. DES to ramus 2005 with late stent thrombosis 2006 tx with PTCA. 12/18 PCI/DES x1 to mRCA, EF 50-55%   CHF (congestive heart failure) (HCC)    Chronic pain    COPD (chronic obstructive pulmonary disease) (HCC)    Hyperlipidemia    Hypertension    Left bundle branch block    Lymphedema    Metastatic breast cancer    a. to bone.   MI (myocardial infarction) (HCC)    Mild aortic stenosis 10/2017   Morbid obesity (HCC)    PAF (paroxysmal atrial fibrillation) (HCC)    PSVT (paroxysmal supraventricular tachycardia) (HCC)    a. per [facility] notes, seen on event monitor in 2014.   Pulmonary nodules    Past Surgical History:  Procedure Laterality Date   CHOLECYSTECTOMY N/A 03/29/2021   Procedure: LAPAROSCOPIC CHOLECYSTECTOMY;  Surgeon: Lucretia Roers, MD;  Location: [facility];  Service: General;  Laterality: N/A;   CORONARY STENT INTERVENTION N/A 11/26/2017   Procedure: CORONARY STENT INTERVENTION;  Surgeon: Swaziland, Peter M, MD;  Location: [facility] INVASIVE CV LAB;  Service: Cardiovascular;  Laterality: N/A;   CORONARY STENT PLACEMENT     ERCP N/A 03/28/2021   Procedure: ENDOSCOPIC RETROGRADE CHOLANGIOPANCREATOGRAPHY (ERCP);  Surgeon: Malissa Hippo, MD;  Location: [facility];  Service: Endoscopy;  Laterality: N/A;  intrathecal pain pump     LEFT HEART CATH AND CORONARY ANGIOGRAPHY N/A 11/26/2017   Procedure: LEFT HEART CATH AND CORONARY ANGIOGRAPHY;  Surgeon: Swaziland, Peter M, MD;  Location: [facility] INVASIVE CV LAB;  Service: Cardiovascular;  Laterality: N/A;   REMOVAL OF STONES  03/28/2021   Procedure: REMOVAL OF STONES;  Surgeon: Malissa Hippo, MD;  Location: [facility];  Service: Endoscopy;;   SPHINCTEROTOMY  03/28/2021   Procedure: SPHINCTEROTOMY;  Surgeon:  Malissa Hippo, MD;  Location: [facility];  Service: Endoscopy;;   UMBILICAL HERNIA REPAIR N/A 04/03/2021   Procedure: ADULT PRIMARY UMBILICAL HERNIA REPAIR;  Surgeon: Lucretia Roers, MD;  Location: [facility];  Service: General;  Laterality: N/A;   VASCULAR SURGERY     Social History:  reports that she quit smoking about 43 years ago. Her smoking use included cigarettes. She started smoking about 61 years ago. She has a 36 pack-year smoking history. She has never used smokeless tobacco. She reports current drug use. Drugs: Oxycodone and Morphine. She reports that she does not drink alcohol.  Allergies  Allergen Reactions   Brilinta [Ticagrelor] Diarrhea and Nausea Only    Nausea and severe diarrhea- general weakness. Patient says does not want to take again   Budesonide Palpitations   Albuterol Other (See Comments)    Heart racing     Family History  Problem Relation Age of Onset   CAD Father 93   Heart attack Father    COPD Sister    CAD Paternal Grandmother    Sudden Cardiac Death Neg Hx    Colon polyps Neg Hx    Colon cancer Neg Hx     Prior to Admission medications   Medication Sig Start Date End Date Taking? Authorizing Provider  diclofenac Sodium (VOLTAREN) 1 % GEL Apply topically. 02/25/24   [provider]  HYDROmorphone (DILAUDID) 1 MG/ML injection Inject 0.5 mLs (0.5 mg total) into the vein every 2 (two) hours as needed for severe pain (pain score 7-10) or moderate pain (pain score 4-6). 03/09/24   Meredeth Ide, MD  lactated ringers infusion Inject 80 mLs into the vein continuous. 03/09/24   Meredeth Ide, MD  ondansetron (ZOFRAN) 4 MG/2ML SOLN injection Inject 2 mLs (4 mg total) into the vein every 6 (six) hours as needed for nausea or vomiting. 03/09/24   Meredeth Ide, MD  piperacillin-tazobactam (ZOSYN) 3.375 GM/50ML IVPB Inject 50 mLs (3.375 g total) into the vein every 8 (eight) hours. 03/09/24   Meredeth Ide, MD    Physical Exam: Vitals:   03/09/24 1602  03/09/24 1613 03/09/24 1756 03/09/24 1816  BP: (!) 140/58 125/64 (!) 173/109 134/67  Pulse: 76 82 81   Resp:  16 17   Temp:  (!) 97.5 F (36.4 C)    TempSrc:  Axillary    SpO2:   99%   Height:  5' (1.524 m)     Physical Exam Vitals and nursing note reviewed.  Constitutional:      General: She is sleeping. She is not in acute distress.    Appearance: She is ill-appearing.  HENT:     Head: Normocephalic and atraumatic.     Mouth/Throat:     Mouth: Mucous membranes are dry.     Pharynx: Oropharynx is clear.  Eyes:     General: Scleral icterus present.  Cardiovascular:     Rate and Rhythm: Normal rate and regular rhythm.     Heart sounds: No murmur heard.  Pulmonary:     Effort: Pulmonary effort is normal. No respiratory distress.     Breath sounds: Normal breath sounds. No wheezing, rhonchi or rales.  Abdominal:     General: Bowel sounds are decreased. There is distension (slightly).     Tenderness: There is no abdominal tenderness.  Musculoskeletal:     Right lower leg: No edema.     Left lower leg: No edema.  Skin:    General: Skin is warm and dry.     Coloration: Skin is jaundiced.  Neurological:     Comments: Sleepy post anesthesia.Unable to assess orientation.    Data Reviewed: CBC today with WBC of 15.1, hemoglobin of 9.0, platelets of 244 CMP today with sodium of 126, bicarb 21, glucose 159, BUN 40, creat 1.99, alkaline phosphatase 1466, AST 509, ALT 160, total bilirubin 11.4 and GFR of 25 Hepatitis panel negative Urinalysis with hematuria, large leukocytes, proteinuria, many bacteria UDS positive for opioids only  DG C-Arm 1-60 Min-No Report Result Date: 03/09/2024 Fluoroscopy was utilized by the requesting physician.  No radiographic interpretation.   MR ABDOMEN MRCP W WO CONTAST Result Date: 03/09/2024 CLINICAL DATA:  Jaundice, metastatic breast cancer EXAM: MRI ABDOMEN WITHOUT AND WITH CONTRAST (INCLUDING MRCP) TECHNIQUE: Multiplanar multisequence MR imaging  of the abdomen was performed both before and after the administration of intravenous contrast. Heavily T2-weighted images of the biliary and pancreatic ducts were obtained, and three-dimensional MRCP images were rendered by post processing. CONTRAST:  9mL GADAVIST GADOBUTROL 1 MMOL/ML IV SOLN COMPARISON:  CT chest abdomen pelvis, 03/08/2024, 01/01/2024 FINDINGS: Examination is unfortunately very limited by breath motion artifact as well as susceptibility and saturation artifact arising from a subcutaneous pain pump in the soft tissues of the right upper quadrant. Lower chest: No acute abnormality. Calcified mass in the right breast (series 5, image 4). Hepatobiliary: No focal liver abnormality is seen. Status post cholecystectomy. Severe intra and extrahepatic biliary ductal dilatation with periportal edema. This appears to be increased when compared to prior contrast enhanced CT dated 01/01/2024. The central common bile duct is very difficult to assess due to artifact and image quality but appears to sharply narrow within the pancreatic head (series 15, image 49). Pancreas: Unremarkable. No pancreatic ductal dilatation or surrounding inflammatory changes. Spleen: Normal in size without significant abnormality. Adrenals/Urinary Tract: Adrenal glands are unremarkable. Heterogeneously enhancing mass in the peripheral superior pole of the left kidney measuring 2.7 x 2.5 cm (series 15, image 37). Right kidney is normal, without obvious renal calculi, solid lesion, or hydronephrosis. Stomach/Bowel: Stomach is within normal limits. No evidence of bowel wall thickening, distention, or inflammatory changes. Vascular/Lymphatic: Severe aortic atherosclerosis. No enlarged abdominal lymph nodes. Other: No abdominal wall hernia or abnormality. No ascites. Musculoskeletal: No acute or significant osseous findings. IMPRESSION: 1. Examination is unfortunately very limited by breath motion artifact as well as susceptibility and  saturation artifact arising from a subcutaneous pain pump in the soft tissues of the right upper quadrant. 2. Within this limitation, severe intra and extrahepatic biliary ductal dilatation with periportal edema. This appears to be increased when compared to prior contrast enhanced CT dated 01/01/2024. The central common bile duct is very difficult to assess due to artifact and image quality but appears to sharply narrow within the pancreatic head. Findings are highly concerning for obstructing calculus or mass; repeat contrast enhanced CT may be helpful to better assess given severe technical limitations of this MR. 3. Heterogeneously enhancing mass in the peripheral superior pole of the  left kidney measuring 2.7 x 2.5 cm, consistent with renal cell carcinoma. Aortic Atherosclerosis (ICD10-I70.0). Electronically Signed   By: Jearld Lesch M.D.   On: 03/09/2024 07:46   MR 3D Recon At Scanner Result Date: 03/09/2024 CLINICAL DATA:  Jaundice, metastatic breast cancer EXAM: MRI ABDOMEN WITHOUT AND WITH CONTRAST (INCLUDING MRCP) TECHNIQUE: Multiplanar multisequence MR imaging of the abdomen was performed both before and after the administration of intravenous contrast. Heavily T2-weighted images of the biliary and pancreatic ducts were obtained, and three-dimensional MRCP images were rendered by post processing. CONTRAST:  9mL GADAVIST GADOBUTROL 1 MMOL/ML IV SOLN COMPARISON:  CT chest abdomen pelvis, 03/08/2024, 01/01/2024 FINDINGS: Examination is unfortunately very limited by breath motion artifact as well as susceptibility and saturation artifact arising from a subcutaneous pain pump in the soft tissues of the right upper quadrant. Lower chest: No acute abnormality. Calcified mass in the right breast (series 5, image 4). Hepatobiliary: No focal liver abnormality is seen. Status post cholecystectomy. Severe intra and extrahepatic biliary ductal dilatation with periportal edema. This appears to be increased when  compared to prior contrast enhanced CT dated 01/01/2024. The central common bile duct is very difficult to assess due to artifact and image quality but appears to sharply narrow within the pancreatic head (series 15, image 49). Pancreas: Unremarkable. No pancreatic ductal dilatation or surrounding inflammatory changes. Spleen: Normal in size without significant abnormality. Adrenals/Urinary Tract: Adrenal glands are unremarkable. Heterogeneously enhancing mass in the peripheral superior pole of the left kidney measuring 2.7 x 2.5 cm (series 15, image 37). Right kidney is normal, without obvious renal calculi, solid lesion, or hydronephrosis. Stomach/Bowel: Stomach is within normal limits. No evidence of bowel wall thickening, distention, or inflammatory changes. Vascular/Lymphatic: Severe aortic atherosclerosis. No enlarged abdominal lymph nodes. Other: No abdominal wall hernia or abnormality. No ascites. Musculoskeletal: No acute or significant osseous findings. IMPRESSION: 1. Examination is unfortunately very limited by breath motion artifact as well as susceptibility and saturation artifact arising from a subcutaneous pain pump in the soft tissues of the right upper quadrant. 2. Within this limitation, severe intra and extrahepatic biliary ductal dilatation with periportal edema. This appears to be increased when compared to prior contrast enhanced CT dated 01/01/2024. The central common bile duct is very difficult to assess due to artifact and image quality but appears to sharply narrow within the pancreatic head. Findings are highly concerning for obstructing calculus or mass; repeat contrast enhanced CT may be helpful to better assess given severe technical limitations of this MR. 3. Heterogeneously enhancing mass in the peripheral superior pole of the left kidney measuring 2.7 x 2.5 cm, consistent with renal cell carcinoma. Aortic Atherosclerosis (ICD10-I70.0). Electronically Signed   By: Jearld Lesch M.D.    On: 03/09/2024 07:46   Results are pending, will review when available.  Assessment and Plan:  # Acute Cholangitis # Obstructive Jaundice  # Transaminitis Patient presented with markedly elevated LFTs with evidence of CBD obstruction in the pancreatic head, unclear whether stone versus mass.  She does have a history of metastatic breast cancer, however no known hepatic mets.  - ERCP per GI - GI following; appreciate their recommendations - Continue Zosyn per pharmacy dosing - Continue to trend LFTs - Blood cultures pending.  No growth to date  # AKI  # Hyponatremia  In the setting of poor p.o. intake  - IV fluids as ordered - Hold nephrotoxic agents - Repeat BMP in the a.m.  # Acute Encephalopathy Patient initially presented with confusion  with underlying memory deficits. Discharge summary earlier this morning noted patient was oriented x 3.  Unable to assess orientation at this time given she is post anesthesia.  # Pyuria  Initial concern for UTI with evidence of pyuria on UA, however patient did not report any urinary symptoms.  She is already receiving Zosyn.  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  She follows with [facility] pain clinic.  - Will need to evaluate if pump is active after MRI yesterday - Resume home regimen  # History of PE - Holding home Eliquis given ERCP today  # Chronic systolic and diastolic HF Per chart review, patient has a history of HFrEF with recovered EF with last echocardiogram in February 2024 demonstrating LVEF above 55%.  EF was as low as 30-35% in 2019 in the setting of ischemic cardiomyopathy. Patient appears hypovolemic on examination.  - Hold home GDMT given high risk for hypotension - Daily weights - Strict in and out  # CAD s/p DES to ramus and RCA No reported chest pain  at this time.  - Restart home Plavix when cleared by GI  # COPD - DuoNebs as needed - Continue home bronchodilators  # Hypertension  - Hold home amlodipine, losartan, metoprolol given high risk for hypotension  # Metastatic Stage 4 breast cancer - On palliative fulvestrant  # Chronic Anemia  History of chronic anemia with baseline hemoglobin approximately 10.  Now 9.0.   - Daily CBC - Transfuse for hemoglobin less than 7  Advance Care Planning:   Code Status: Limited: Do not attempt resuscitation (DNR) -DNR-LIMITED -Do Not Intubate/DNI  Per palliative care discussion earlier today with patient  Consults: GI  Family Communication: No family at bedside  Severity of Illness: The appropriate patient status for this patient is INPATIENT. Inpatient status is judged to be reasonable and necessary in order to provide the required intensity of service to ensure the patient's safety. The patient's presenting symptoms, physical exam findings, and initial radiographic and laboratory data in the context of their chronic comorbidities is felt to place them at high risk for further clinical deterioration. Furthermore, it is not anticipated that the patient will be medically stable for discharge from the hospital within 2 midnights of admission.   * I certify that at the point of admission it is my clinical judgment that the patient will require inpatient hospital care spanning beyond 2 midnights from the point of admission due to high intensity of service, high risk for further deterioration and high frequency of surveillance required.*  Author: Verdene Lennert, MD 03/09/2024 6:18 PM  For on call review [email or web address].

## 2024-03-09 NOTE — Consult Note (Signed)
 Consultation Note Date: 03/09/2024   Patient Name: Stacey [name]  DOB: May 07, 1946  MRN: [id number]  Age / Sex: 78 y.o., female  PCP: Ignatius Specking, MD Referring Physician: Meredeth Ide, MD  Reason for Consultation: Establishing goals of care  HPI/Patient Profile: 78 y.o. female  with past medical history of hypertension, hyperlipidemia, CAD, breast cancer (follows with Dr. Ellin Saba) admitted on 03/08/2024 with acute metabolic encephalopathy/presumed UTI prior to admit, transaminitis.   Clinical Assessment and Goals of Care: I have reviewed medical records including EPIC notes, labs and imaging, received report from RN, assessed the patient.  Stacey [name] is lying quietly in bed.  She appears acutely/chronically ill and frail, obese.  She is clearly jaundiced.  Her son Randel Pigg and her daughter Ellyn Hack are present at bedside.  During my first 2 attempts to visit with Stacey [name] bedside nurses present attending to admit.  When I returned to patient's room family has left.  We meet at the bedside to discuss diagnosis prognosis, GOC, EOL wishes, disposition and options. I introduced Palliative Medicine as specialized medical care for people living with serious illness. It focuses on providing relief from the symptoms and stress of a serious illness. The goal is to improve quality of life for both the patient and the family.  We discussed a brief life review of the patient.  Stacey [name] was raised in [place] until age 32 when she moved to [place].  She is a widow.  She has 2 children, son Randel Pigg and daughter Maxine Glenn.  She states she had many jobs, but retired from [employer] after 23 years.  She currently lives in her son Tony's home.  She tells me that she has assistance with ADLs, bathing and dressing from her son Alinda Money.  She tells me that she has been in essence bedbound for a year and a half.   She tells me that she is not emergent EMS services to get to her appointments.  We then focused on their current illness.  We talked about her presumed UTI and the treatment plan.  We talked about jaundice and the plan for ERCP at [facility] today.  We talked about her cancer treatment with Dr. Ellin Saba.  Stacey [name] tells me that she will continue to take cancer treatment as offered.  The natural disease trajectory and expectations at EOL were discussed.  Advanced directives, concepts specific to code status, artifical feeding and hydration, and rehospitalization were considered and discussed.  We talked about the concept of "treat the treatable, but allow a natural death".  [name] states that she would not want to go that far, she endorses DNR.  She tells me that her children know her wish and can stand by it.   Palliative Care services outpatient were explained and offered.  She is considering.  Discussed the importance of continued conversation with family and the medical providers regarding overall plan of care and treatment options, ensuring decisions are within the context of the patient's values and  GOCs.  Questions and concerns were addressed.  Hard Choices booklet left for review. The family was encouraged to call with questions or concerns.  PMT will continue to support holistically.  Conference with attending, bedside nursing staff, transition of care team related to patient condition, needs, goals of care, disposition.   HCPOA  NEXT OF KIN -[name] names her son, Randel Pigg, as her primary surrogate decision maker.  Her daughter Maxine Glenn is second    SUMMARY OF RECOMMENDATIONS   Continue to treat the treatable but no CPR or intubation Transfer to [facility] for ERCP Open to all treatments as offered including surgery if needed.   Code Status/Advance Care Planning: DNR  Symptom Management:  Per hospitalist, no additional needs at this time.   Palliative Prophylaxis:  Frequent  Pain Assessment, Oral Care, and Palliative Wound Care  Additional Recommendations (Limitations, Scope, Preferences): Full Scope Treatment  Psycho-social/Spiritual:  Desire for further Chaplaincy support:no Additional Recommendations: Caregiving  Support/Resources  Prognosis:  Unable to determine, based on outcomes. Guarded at this time.   Discharge Planning: To Be Determined      Primary Diagnoses: Present on Admission:  Acute metabolic encephalopathy  Essential hypertension  Transaminitis  Jaundice  Leukocytosis  Mixed hyperlipidemia  Chronic pain   I have reviewed the medical record, interviewed the patient and family, and examined the patient. The following aspects are pertinent.  Past Medical History:  Diagnosis Date   CAD in native artery    a. DES to ramus 2005 with late stent thrombosis 2006 tx with PTCA. 12/18 PCI/DES x1 to mRCA, EF 50-55%   CHF (congestive heart failure) (HCC)    Chronic pain    COPD (chronic obstructive pulmonary disease) (HCC)    Hyperlipidemia    Hypertension    Left bundle branch block    Lymphedema    Metastatic breast cancer    a. to bone.   MI (myocardial infarction) (HCC)    Mild aortic stenosis 10/2017   Morbid obesity (HCC)    PAF (paroxysmal atrial fibrillation) (HCC)    PSVT (paroxysmal supraventricular tachycardia) (HCC)    a. per [facility] notes, seen on event monitor in 2014.   Pulmonary nodules    Social History   Socioeconomic History   Marital status: Single    Spouse name: Not on file   Number of children: Not on file   Years of education: Not on file   Highest education level: Not on file  Occupational History   Occupation: Retired  Tobacco Use   Smoking status: Former    Current packs/day: 0.00    Average packs/day: 2.0 packs/day for 18.0 years (36.0 ttl pk-yrs)    Types: Cigarettes    Start date: 12/01/1962    Quit date: 12/01/1980    Years since quitting: 43.2   Smokeless tobacco: Never  Vaping Use   Vaping  status: Never Used  Substance and Sexual Activity   Alcohol use: No   Drug use: Yes    Types: Oxycodone, Morphine    Comment: takes methadone   Sexual activity: Not Currently  Other Topics Concern   Not on file  Social History Narrative   Not on file   Social Drivers of Health   Financial Resource Strain: High Risk (01/04/2024)   Received from [facility]   Overall Financial Resource Strain (CARDIA)    Difficulty of Paying Living Expenses: Hard  Food Insecurity: No Food Insecurity (01/04/2024)   [facility]  Hunger Vital Sign    Worried About Running Out of Food in the Last Year: Never true    Ran Out of Food in the Last Year: Never true  Transportation Needs: Unmet Transportation Needs (01/04/2024)   Received from [facility] - Transportation    In the past 12 months, has lack of transportation kept you from medical appointments or from getting medications?: Yes    Lack of Transportation (Non-Medical): Yes  Physical Activity: Inactive (08/17/2023)   Received from [facility]   Exercise Vital Sign    Days of Exercise per Week: 0 days    Minutes of Exercise per Session: 0 min  Stress: No Stress Concern Present (08/17/2023)   Received from [facility] - Occupational Stress Questionnaire    Feeling of Stress : Only a little  Social Connections: Moderately Isolated (08/17/2023)   Received from [facility]   Social Connection and Isolation Panel [NHANES]    Frequency of Communication with Friends and Family: Three times a week    Frequency of Social Gatherings with Friends and Family: Three times a week    Attends Religious Services: More than 4 times per year    Active Member of Clubs or Organizations: No    Attends Banker Meetings: Never    Marital Status: Divorced   Family History  Problem  Relation Age of Onset   CAD Father 76   Heart attack Father    COPD Sister    CAD Paternal Grandmother    Sudden Cardiac Death Neg Hx    Colon polyps Neg Hx    Colon cancer Neg Hx    Scheduled Meds: Continuous Infusions:  lactated ringers 80 mL/hr at 03/09/24 0424   piperacillin-tazobactam (ZOSYN)  IV Stopped (03/09/24 0521)   sodium chloride Stopped (03/08/24 1855)   PRN Meds:.HYDROmorphone (DILAUDID) injection, ondansetron (ZOFRAN) IV Medications Prior to Admission:  Prior to Admission medications   Medication Sig Start Date End Date Taking? Authorizing Provider  acetaminophen (TYLENOL) 325 MG tablet Take 2 tablets (650 mg total) by mouth every 6 (six) hours as needed for mild pain, fever or headache. 04/05/21   Shon Hale, MD  amLODipine (NORVASC) 5 MG tablet TAKE 1 TABLET EVERY DAY 05/12/23   Branch, Dorothe Pea, MD  apixaban (ELIQUIS) 5 MG TABS tablet Take 1 tablet (5 mg total) by mouth 2 (two) times daily. 02/08/24   Antoine Poche, MD  atorvastatin (LIPITOR) 80 MG tablet TAKE 1 TABLET EVERY MORNING 08/05/23   Antoine Poche, MD  b complex vitamins tablet Take 1 tablet by mouth every morning. 07/05/20   Albertine Grates, MD  carboxymethylcellulose (REFRESH PLUS) 0.5 % SOLN Place 1 drop into both eyes as needed.    [provider]  clopidogrel (PLAVIX) 75 MG tablet Take 1 tablet (75 mg total) by mouth daily. 08/25/23   Antoine Poche, MD  DULoxetine (CYMBALTA) 30 MG capsule Take 3 capsules (90 mg total) by mouth daily. Patient taking differently: Take 30 mg by mouth 2 (two) times daily. 07/06/20   Albertine Grates, MD  ferrous sulfate 325 (65 FE) MG tablet Take 325 mg by mouth daily.    [provider]  HYDROmorphone (DILAUDID) 2 MG tablet Take 1 tablet (2 mg total) by mouth every 6 (six) hours as needed for severe pain (breakthrough pain). 02/26/23   Harris, Cammy Copa, PA-C  lidocaine (LIDODERM) 5 %  Place 1 patch onto the skin daily. Remove & Discard patch within 12 hours or  as directed by MD 12/07/23   Trisha Mangle, Tammy, PA-C  losartan (COZAAR) 100 MG tablet TAKE 1 TABLET EVERY DAY 11/02/23   Antoine Poche, MD  methadone (DOLOPHINE) 5 MG tablet Take 5 mg by mouth every 8 (eight) hours. 09/03/23   [provider]  methocarbamol (ROBAXIN) 500 MG tablet Take 1 tablet (500 mg total) by mouth 3 (three) times daily. 12/07/23   Triplett, Tammy, PA-C  metoprolol succinate (TOPROL-XL) 100 MG 24 hr tablet TAKE 1 AND 1/2 TABLETS EVERY DAY. DOSE INCREASE. 07/01/23   Antoine Poche, MD  Multiple Vitamin (MULTIVITAMIN ADULT PO) Take 1 tablet by mouth daily.    [provider]  Multiple Vitamins-Minerals (EQ VISION FORMULA 50+ PO) Take 1 tablet by mouth every morning.    [provider]  naloxone [facility]) nasal spray 4 mg/0.1 mL Place into the nose. 02/27/23   [provider]  nitroGLYCERIN (NITROSTAT) 0.4 MG SL tablet Place 1 tablet (0.4 mg total) under the tongue every 5 (five) minutes x 3 doses as needed for chest pain (if no relief after 3rd dose, proceed to the ED or call 911). 05/05/23   Antoine Poche, MD  oxyCODONE (OXY IR/ROXICODONE) 5 MG immediate release tablet Take by mouth. 01/07/23   [provider]  Oxycodone HCl 10 MG TABS Take 10 mg by mouth 3 (three) times daily as needed. 08/26/22   [provider]  PAIN MANAGEMENT INTRATHECAL, IT, PUMP by Intrathecal route. 02/21/21   [provider]  pregabalin (LYRICA) 50 MG capsule Take 50 mg by mouth daily as needed (pain). 01/26/21   [provider]   Allergies  Allergen Reactions   Brilinta [Ticagrelor] Diarrhea and Nausea Only    Nausea and severe diarrhea- general weakness. Patient says does not want to take again   Budesonide Palpitations   Albuterol Other (See Comments)    Heart racing    Review of Systems  Unable to perform ROS: Age    Physical Exam Vitals and nursing note reviewed.  Constitutional:      General: She is not in acute  distress.    Appearance: She is obese. She is ill-appearing.  Cardiovascular:     Rate and Rhythm: Normal rate.  Pulmonary:     Effort: Pulmonary effort is normal. No respiratory distress.  Skin:    General: Skin is warm and dry.     Coloration: Skin is jaundiced.     Comments: Wounds as noted by nursing  Neurological:     Mental Status: She is alert.  Psychiatric:        Mood and Affect: Mood normal.        Behavior: Behavior normal.     Vital Signs: BP 135/69 (BP Location: Left Arm)   Pulse 78   Temp 97.7 F (36.5 C) (Oral)   Resp 16   Wt 90.1 kg   SpO2 98%   BMI 38.79 kg/m  Pain Scale: 0-10   Pain Score: 10-Worst pain ever   SpO2: SpO2: 98 % O2 Device:SpO2: 98 % O2 Flow Rate: .   IO: Intake/output summary:  Intake/Output Summary (Last 24 hours) at 03/09/2024 0902 Last data filed at 03/09/2024 0521 Gross per 24 hour  Intake 1579.83 ml  Output --  Net 1579.83 ml    LBM: Last BM Date : 03/09/24 Baseline Weight: Weight: 90.1 kg Most recent weight: Weight: 90.1 kg  Palliative Assessment/Data:     Time In: 0930  Time Out: 1045 Time Total: 75 minutes  Greater than 50%  of this time was spent counseling and coordinating care related to the above assessment and plan.  Signed by: Katheran Awe, NP   Please contact Palliative Medicine Team phone at [phone] for questions and concerns.  For individual provider: See Loretha Stapler

## 2024-03-09 NOTE — OR Nursing (Signed)
 Stacey [name] was transported by bed back to room 205A. Report was called to Selena Batten, RN prior to transport.

## 2024-03-09 NOTE — OR Nursing (Signed)
 Attending MD, Dr. Huel Cote said patient will be moving to progressive care, order not placed yet at this moment, but Dr. Huel Cote said it is ok to move pt back to 2c until progressive bed is ready.

## 2024-03-09 NOTE — Anesthesia Postprocedure Evaluation (Signed)
 Anesthesia Post Note  Patient: Stacey [name]  Procedure(s) Performed: ERCP, WITH INTERVENTION IF INDICATED Balloon dilation wire-guided INSERTION, STENT, BILE DUCT  Patient location during evaluation: PACU Anesthesia Type: General Level of consciousness: awake and alert, oriented and patient cooperative Pain management: pain level controlled Vital Signs Assessment: post-procedure vital signs reviewed and stable Respiratory status: spontaneous breathing, nonlabored ventilation and respiratory function stable Cardiovascular status: blood pressure returned to baseline and stable Postop Assessment: adequate PO intake Anesthetic complications: no   No notable events documented.   Last Vitals:  Vitals:   03/09/24 1816 03/09/24 1844  BP: 134/67 (!) 125/99  Pulse:  84  Resp:  16  Temp:    SpO2:  92%    Last Pain:  Vitals:   03/09/24 1844  TempSrc: Axillary  PainSc: 0-No pain                 Reed Breech

## 2024-03-09 NOTE — Anesthesia Preprocedure Evaluation (Addendum)
 Anesthesia Evaluation  Patient identified by MRN, date of birth, ID band Patient awake    Reviewed: Allergy & Precautions, NPO status , Patient's Chart, lab work & pertinent test results  History of Anesthesia Complications Negative for: history of anesthetic complications  Airway Mallampati: III   Neck ROM: Full    Dental  (+) Upper Dentures, Partial Lower, Chipped   Pulmonary COPD, former smoker (quit 1982)   Pulmonary exam normal breath sounds clear to auscultation       Cardiovascular hypertension, + CAD (s/p MI and stents) and +CHF (EF 20-25%)  Normal cardiovascular exam+ dysrhythmias (a fib on Plavix and Eliquis) + Valvular Problems/Murmurs (moderate AS)  Rhythm:Regular Rate:Normal  Hx PE 03/2023 on dual anticoagulation  ECG 03/08/24: SR; nonspecific IVCD with LAD   Neuro/Psych  PSYCHIATRIC DISORDERS Anxiety Depression Bipolar Disorder   Chronic pain    GI/Hepatic Pt has been feeling nauseated   Endo/Other  Obesity   Renal/GU      Musculoskeletal  (+) Arthritis ,    Abdominal   Peds  Hematology  (+) Blood dyscrasia, anemia Breast CA with mets to bone and kidney   Anesthesia Other Findings Cardiology note 01/13/24:  1. HFimpEF - LVEF has normalized -no symptoms, continue current meds   2. CAD - had been on extended DAPT due to history of late stent thrombosis - now that she is on eliquis would d/c ASA, continue plavix and eliquis.  - recent atypical chest pains with negative ER workup, monitor at this time. In general poor candidate for ischemic testing given metastatic cancer, bed bound severe lower back pain with pain pump   3. PAF -off coumadin since intrathecal pain pump placement at [facility] after discussions with pain clinic - she is back on anticoag however after PE, currently on eliquis -defer anticoag to her [facility] team, continue as long as ok from intrathecal pump standpoint.    4. HTN -at goal,  continue current meds   5. Aortic stenosis - recent echo at [facility] reports no stenosis but no parameters are included in the study - from prior echos mild to mod AS, low than expected gradient give low SVI, paradoxical low flow stenosi - no indication for repeat study at this time. With malignant cancer and immobility likely would not plan on ongoing surveillance at this time but can discuss over time.    Reproductive/Obstetrics                             Anesthesia Physical Anesthesia Plan  ASA: 4  Anesthesia Plan: General   Post-op Pain Management:    Induction: Intravenous and Rapid sequence  PONV Risk Score and Plan: 3 and Treatment may vary due to age or medical condition, Ondansetron and Dexamethasone  Airway Management Planned: Oral ETT  Additional Equipment:   Intra-op Plan:   Post-operative Plan: Extubation in OR  Informed Consent: I have reviewed the patients History and Physical, chart, labs and discussed the procedure including the risks, benefits and alternatives for the proposed anesthesia with the patient or authorized representative who has indicated his/her understanding and acceptance.     Dental advisory given  Plan Discussed with: CRNA  Anesthesia Plan Comments: (Patient consented for risks of anesthesia including but not limited to:  - adverse reactions to medications - damage to eyes, teeth, lips or other oral mucosa - nerve damage due to positioning  - sore throat or hoarseness - damage to heart,  brain, nerves, lungs, other parts of body or loss of life  Informed patient about role of CRNA in peri- and intra-operative care.  Patient voiced understanding.)       Anesthesia Quick Evaluation

## 2024-03-09 NOTE — Transfer of Care (Signed)
 Immediate Anesthesia Transfer of Care Note  Patient: Stacey [name]  Procedure(s) Performed: ERCP, WITH INTERVENTION IF INDICATED Balloon dilation wire-guided  Patient Location: PACU and Endoscopy Unit  Anesthesia Type:General  Level of Consciousness: drowsy and patient cooperative  Airway & Oxygen Therapy: Patient Spontanous Breathing and Patient connected to face mask oxygen  Post-op Assessment: Report given to RN and Post -op Vital signs reviewed and stable  Post vital signs: Reviewed and stable  Last Vitals:  Vitals Value Taken Time  BP 177/109 (121) 03/09/2024  Temp    Pulse 87   Resp    SpO2 99     Last Pain:  Vitals:   03/09/24 1613  TempSrc: Axillary         Complications: No notable events documented.

## 2024-03-09 NOTE — Op Note (Signed)
 [facility] Gastroenterology Patient Name: [place] Procedure Date: 03/09/2024 4:02 PM MRN: [id number] Account #: [id number] Date of Birth: [birth year] Admit Type: Outpatient Age: 78 Room: [facility] ENDO ROOM 4 Gender: Female Note Status: Finalized Instrument Name: Nolon Stalls [id number] Procedure:             ERCP Indications:           Ascending cholangitis Providers:             [name] MD, MD Referring MD:          Meredeth Ide (Referring MD) Medicines:             General Anesthesia Complications:         No immediate complications. Procedure:             Pre-Anesthesia Assessment:                        - Prior to the procedure, a History and Physical was                         performed, and patient medications and allergies were                         reviewed. The patient's tolerance of previous                         anesthesia was also reviewed. The risks and benefits                         of the procedure and the sedation options and risks                         were discussed with the patient. All questions were                         answered, and informed consent was obtained. Prior                         Anticoagulants: The patient has taken Plavix                         (clopidogrel), last dose was 1 day prior to procedure.                         ASA Grade Assessment: III - A patient with severe                         systemic disease. After reviewing the risks and                         benefits, the patient was deemed in satisfactory                         condition to undergo the procedure.                        After obtaining informed consent, the scope was passed  under direct vision. Throughout the procedure, the                         patient's blood pressure, pulse, and oxygen                         saturations were monitored continuously. The                         Duodenoscope was introduced  through the mouth, and                         used to inject contrast into and used to cannulate the                         bile duct. The ERCP was accomplished without                         difficulty. The patient tolerated the procedure well. Findings:      A scout film of the abdomen was obtained. Surgical clips, consistent       with previous cholecystectomy, were seen in the area of the cystic duct.       The esophagus was successfully intubated under direct vision. The scope       was advanced to a normal major papilla in the descending duodenum       without detailed examination of the pharynx, larynx and associated       structures, and upper GI tract. The upper GI tract was grossly normal.       The bile duct was deeply cannulated with the short-nosed traction       sphincterotome. Contrast was injected. I personally interpreted the bile       duct images. There was brisk flow of contrast through the ducts. Image       quality was excellent. Contrast extended to the entire biliary tree. The       main bile duct contained filling defect(s). A standard       esophagogastroduodenoscopy scope was used for the examination of the       upper gastrointestinal tract. The scope was passed under direct vision       through the upper GI tract. An acquired benign-appearing, intrinsic       severe stenosis was found in the first portion of the duodenum and was       traversed after dilation. A standard esophagogastroduodenoscopy scope       was used for the examination of the upper gastrointestinal tract. The       scope was passed under direct vision through the upper GI tract. Many       oozing cratered duodenal ulcers with pigmented material were found in       the duodenal bulb. A wire was passed into the biliary tree. Dilation of       the common bile duct with a 6-7-8 mm balloon (to a maximum balloon size       of 6 mm) dilator was successful. One 10 Fr by 7 cm plastic stent with a        single external flap and a single internal flap was placed into the       common bile duct. The stent was in good position. The  stent could not be       passed due to ampullary stenosis therefore it was dilated. Impression:            - Acquired duodenal stenosis.                        - Oozing duodenal ulcers with pigmented material.                        - A filling defect was seen on the cholangiogram.                        - Common bile duct was successfully dilated.                        - One plastic stent was placed into the common bile                         duct. Recommendation:        - Clear liquid diet.                        - Continue present medications.                        - Watch for pancreatitis, bleeding, perforation, and                         cholangitis.                        - Repeat ERCP in 3 months to remove stent. Procedure Code(s):     --- Professional ---                        [phone], Endoscopic retrograde cholangiopancreatography                         (ERCP); with placement of endoscopic stent into                         biliary or pancreatic duct, including pre- and                         post-dilation and guide wire passage, when performed,                         including sphincterotomy, when performed, each stent                        60454, Endoscopic catheterization of the biliary                         ductal system, radiological supervision and                         interpretation Diagnosis Code(s):     --- Professional ---                        K83.09, Other cholangitis  K26.4, Chronic or unspecified duodenal ulcer with                         hemorrhage                        R93.2, Abnormal findings on diagnostic imaging of                         liver and biliary tract CPT copyright 2022 American Medical Association. All rights reserved. The codes documented in this report are preliminary and upon  coder review may  be revised to meet current compliance requirements. [name] MD, MD 03/09/2024 5:55:32 PM This report has been signed electronically. Number of Addenda: 0 Note Initiated On: 03/09/2024 4:02 PM Estimated Blood Loss:  Estimated blood loss: none.      [facility]

## 2024-03-09 NOTE — Progress Notes (Signed)
 Patient transferring to [facility] for procedure and transfer. Report given to the nurse and transport paramedic.

## 2024-03-09 NOTE — ED Notes (Signed)
 Pt reports have a sacral wound. When this nurse assessed there was a miraplex bandage in place. Old dressing was removed due to being soiled. Pt has small bowel movement that appeared tan and round clay like. There was a sacral wound observed at the top of her coccyx. And a new smaller one on her left gluteal fold. New addressing applied.

## 2024-03-09 NOTE — Anesthesia Procedure Notes (Signed)
 Procedure Name: Intubation Date/Time: 03/09/2024 4:41 PM  Performed by: Omer Jack, CRNAPre-anesthesia Checklist: Patient identified, Emergency Drugs available, Suction available and Patient being monitored Patient Re-evaluated:Patient Re-evaluated prior to induction Oxygen Delivery Method: Circle system utilized Preoxygenation: Pre-oxygenation with 100% oxygen Induction Type: IV induction, Rapid sequence and Cricoid Pressure applied Laryngoscope Size: McGrath and 3 Grade View: Grade I Tube type: Oral Tube size: 6.5 mm Airway Equipment and Method: Stylet and Video-laryngoscopy Placement Confirmation: ETT inserted through vocal cords under direct vision, positive ETCO2 and breath sounds checked- equal and bilateral Secured at: 21 cm Tube secured with: Tape Dental Injury: Teeth and Oropharynx as per pre-operative assessment

## 2024-03-09 NOTE — TOC Initial Note (Addendum)
 Transition of Care [facility]) - Initial/Assessment Note    Patient Details  Name: Stacey [name] MRN: [id number] Date of Birth: 21-Aug-1946  [facility] CM/SW Contact:    Beather Arbour Phone Number: 03/09/2024, 8:21 AM  Clinical Narrative:                  This Clinical research associate spoke with patient at bedside and assessed her. Patient is at risk for readmission due to high risk score. Patient was admitted for Acute metabolic encephalopathy. Patient lives in a single family home with her son who assist with her meals and they both do her bath together. Patient expressed that she is bed ridden and been like that for 3 months now. Patient expressed that she could benefit from SNF since she cannot do anything, but when asking her if she was willing to give up her SSI check patient declined stating that she pays all the bills. Patient does have DME in the home and had HH before but currently is not active with anyone. TOC to follow.   TOC was consulted for Medication Assistance. Patient has Medicare services which covers medication as long as she has Part D benefits and meet her deductibles. Patient will not qualify for alternative assistance. Patient will be transferring to [facility]; TOC to follow there.     Expected Discharge Plan: Home/Self Care Barriers to Discharge: Continued Medical Work up   Patient Goals and CMS Choice Patient states their goals for this hospitalization and ongoing recovery are:: return home CMS Medicare.gov Compare Post Acute Care list provided to:: Patient Choice offered to / list presented to : Patient      Expected Discharge Plan and Services In-house Referral: Clinical Social Work   Post Acute Care Choice: Durable Medical Equipment Living arrangements for the past 2 months: Single Family Home                 DME Arranged: Hospital bed, 3-N-1, Tub bench                    Prior Living Arrangements/Services Living arrangements for the past 2  months: Single Family Home Lives with:: Adult Children Patient language and need for interpreter reviewed:: Yes Do you feel safe going back to the place where you live?: Yes (Patient would like to go to SNF but does not want to give up her check since she pays the bills.)      Need for Family Participation in Patient Care: Yes (Comment) Care giver support system in place?: Yes (comment) Current home services: DME Criminal Activity/Legal Involvement Pertinent to Current Situation/Hospitalization: No - Comment as needed  Activities of Daily Living      Permission Sought/Granted      Share Information with NAME: [name]     Permission granted to share info w Relationship: patient     Emotional Assessment Appearance:: Appears stated age Attitude/Demeanor/Rapport: Engaged Affect (typically observed): Adaptable, Accepting, Appropriate Orientation: : Oriented to Self, Oriented to Place, Oriented to  Time, Oriented to Situation   Psych Involvement: No (comment)  Admission diagnosis:  Acute metabolic encephalopathy [G93.41] Patient Active Problem List   Diagnosis Date Noted   UTI (urinary tract infection) 03/09/2024   AKI (acute kidney injury) (HCC) 03/09/2024   Acute metabolic encephalopathy 03/08/2024   Bronchospasm 03/24/2022   Acute bronchospasm 03/23/2022   Elevated brain natriuretic peptide (BNP) level 03/23/2022   Macrocytic anemia 03/23/2022   Incarcerated umbilical hernia    Cholecystitis  Jaundice    Cholecystitis, acute with cholelithiasis 03/25/2021   Abdominal pain 03/25/2021   Leukocytosis 03/25/2021   Hypokalemia 03/25/2021   Hyperglycemia 03/25/2021   Chronic combined systolic and diastolic CHF (congestive heart failure) (HCC) 03/25/2021   Obesity, Class II, BMI 35-39.9 03/25/2021   Choledocholithiasis    Rectal bleeding 08/23/2020   Supratherapeutic INR 08/23/2020   Left hip pain    Pain of metastatic malignancy 06/27/2020   Malignant neoplasm of right  breast in female, estrogen receptor positive (HCC)    Goals of care, counseling/discussion    Advanced care planning/counseling discussion    Spinal stenosis of lumbar region    Malignant neoplasm metastatic to bone [facility])    Palliative care by specialist    Lumbar pain 04/24/2020   Lumbar radiculopathy 02/16/2020   Palpitations 11/16/2019   Dysuria    Acute bilateral low back pain 10/02/2019   Bipolar 2 disorder, major depressive episode (HCC) 09/01/2019   Adjustment disorder with depressed mood 06/30/2019   Atrial fibrillation with rapid ventricular response (HCC) 11/14/2018   Fall    Obesity, Class III, BMI 40-49.9 (morbid obesity) (HCC)    Chronic respiratory failure with hypoxia (HCC)    Transaminitis 10/07/2018   PSVT (paroxysmal supraventricular tachycardia) (HCC) 10/07/2018   Mixed hyperlipidemia 10/07/2018   COPD (chronic obstructive pulmonary disease) (HCC) 10/07/2018   CAD in native artery 10/07/2018   Chronic pain 10/07/2018   Acute back pain 10/07/2018   Acute hypoxemic respiratory failure (HCC) 09/19/2018   Acute on chronic combined systolic and diastolic CHF (congestive heart failure) (HCC) 09/19/2018   COPD with acute exacerbation (HCC) 09/19/2018   Dyspnea 11/30/2017   Non-ST elevation (NSTEMI) myocardial infarction [facility])    Essential hypertension 11/23/2017   Left bundle branch block 11/23/2017   Acute on chronic diastolic CHF (congestive heart failure) (HCC) 11/23/2017   CAD (coronary artery disease) 11/23/2017   Primary malignant neoplasm of breast with metastasis (HCC) 11/23/2017   Acute respiratory failure with hypoxia (HCC) 11/23/2017   Atypical nevus 07/29/2016   Age-related nuclear cataract, bilateral 02/12/2016   Anxiety and depression 01/24/2016   Arthropathy, lower leg 09/14/2013   Tibialis tendinitis 02/22/2013   Plantar fasciitis 01/17/2013   PCP:  Ignatius Specking, MD Pharmacy:   [facility] [address], [place] - [address] [place] [address] Phone: [phone] Fax: [phone]  [facility] - [place], [place] - [address] [place] [address] Phone: [phone] Fax: [phone]     Social Drivers of Health (SDOH) Social History: SDOH Screenings   Food Insecurity: No Food Insecurity (01/04/2024)   Received from [facility]  Housing: Low Risk  (08/17/2023)   Received from [facility]  Transportation Needs: Unmet Transportation Needs (01/04/2024)   Received from [facility]  Utilities: Not At Risk (08/17/2023)   Received from [facility]  Financial Resource Strain: High Risk (01/04/2024)   Received from [facility]  Physical Activity: Inactive (08/17/2023)   Received from [facility]  Social Connections: Moderately Isolated (08/17/2023)   Received from [facility]  Stress: No Stress Concern Present (08/17/2023)   Received from [facility]  Tobacco Use: Medium Risk (01/13/2024)  Health Literacy: Inadequate Health Literacy (08/17/2023)   Received from [facility]   SDOH Interventions:     Readmission Risk Interventions  03/09/2024    8:15 AM  Readmission Risk Prevention Plan  Transportation Screening Complete  HRI or Home Care Consult Complete  Social Work Consult for Recovery Care Planning/Counseling Complete  Palliative Care Screening Complete  Medication Review Ocean[facility]) Complete

## 2024-03-10 ENCOUNTER — Telehealth: Payer: Self-pay

## 2024-03-10 ENCOUNTER — Encounter: Payer: Self-pay | Admitting: Gastroenterology

## 2024-03-10 DIAGNOSIS — R5383 Other fatigue: Secondary | ICD-10-CM

## 2024-03-10 DIAGNOSIS — K264 Chronic or unspecified duodenal ulcer with hemorrhage: Secondary | ICD-10-CM | POA: Diagnosis not present

## 2024-03-10 DIAGNOSIS — R7401 Elevation of levels of liver transaminase levels: Secondary | ICD-10-CM | POA: Diagnosis not present

## 2024-03-10 DIAGNOSIS — N179 Acute kidney failure, unspecified: Secondary | ICD-10-CM | POA: Diagnosis not present

## 2024-03-10 DIAGNOSIS — K8309 Other cholangitis: Secondary | ICD-10-CM | POA: Diagnosis not present

## 2024-03-10 DIAGNOSIS — K831 Obstruction of bile duct: Secondary | ICD-10-CM | POA: Diagnosis not present

## 2024-03-10 DIAGNOSIS — D649 Anemia, unspecified: Secondary | ICD-10-CM | POA: Insufficient documentation

## 2024-03-10 DIAGNOSIS — I10 Essential (primary) hypertension: Secondary | ICD-10-CM

## 2024-03-10 DIAGNOSIS — K315 Obstruction of duodenum: Secondary | ICD-10-CM | POA: Diagnosis not present

## 2024-03-10 LAB — SODIUM, URINE, RANDOM: Sodium, Ur: 11 mmol/L

## 2024-03-10 LAB — CBC WITH DIFFERENTIAL/PLATELET
Abs Immature Granulocytes: 0.13 10*3/uL — ABNORMAL HIGH (ref 0.00–0.07)
Basophils Absolute: 0 10*3/uL (ref 0.0–0.1)
Basophils Relative: 0 %
Eosinophils Absolute: 0 10*3/uL (ref 0.0–0.5)
Eosinophils Relative: 0 %
HCT: 23.2 % — ABNORMAL LOW (ref 36.0–46.0)
Hemoglobin: 8.1 g/dL — ABNORMAL LOW (ref 12.0–15.0)
Immature Granulocytes: 1 %
Lymphocytes Relative: 3 %
Lymphs Abs: 0.3 10*3/uL — ABNORMAL LOW (ref 0.7–4.0)
MCH: 31.6 pg (ref 26.0–34.0)
MCHC: 34.9 g/dL (ref 30.0–36.0)
MCV: 90.6 fL (ref 80.0–100.0)
Monocytes Absolute: 0.2 10*3/uL (ref 0.1–1.0)
Monocytes Relative: 2 %
Neutro Abs: 11.4 10*3/uL — ABNORMAL HIGH (ref 1.7–7.7)
Neutrophils Relative %: 94 %
Platelets: 263 10*3/uL (ref 150–400)
RBC: 2.56 MIL/uL — ABNORMAL LOW (ref 3.87–5.11)
RDW: 16.5 % — ABNORMAL HIGH (ref 11.5–15.5)
WBC: 12.1 10*3/uL — ABNORMAL HIGH (ref 4.0–10.5)
nRBC: 0 % (ref 0.0–0.2)

## 2024-03-10 LAB — GLUCOSE, CAPILLARY: Glucose-Capillary: 100 mg/dL — ABNORMAL HIGH (ref 70–99)

## 2024-03-10 LAB — COMPREHENSIVE METABOLIC PANEL WITH GFR
ALT: 154 U/L — ABNORMAL HIGH (ref 0–44)
AST: 499 U/L — ABNORMAL HIGH (ref 15–41)
Albumin: 1.7 g/dL — ABNORMAL LOW (ref 3.5–5.0)
Alkaline Phosphatase: 1211 U/L — ABNORMAL HIGH (ref 38–126)
Anion gap: 10 (ref 5–15)
BUN: 47 mg/dL — ABNORMAL HIGH (ref 8–23)
CO2: 22 mmol/L (ref 22–32)
Calcium: 8 mg/dL — ABNORMAL LOW (ref 8.9–10.3)
Chloride: 95 mmol/L — ABNORMAL LOW (ref 98–111)
Creatinine, Ser: 2.28 mg/dL — ABNORMAL HIGH (ref 0.44–1.00)
GFR, Estimated: 22 mL/min — ABNORMAL LOW (ref >60)
Glucose, Bld: 123 mg/dL — ABNORMAL HIGH (ref 70–99)
Potassium: 5.1 mmol/L (ref 3.5–5.1)
Sodium: 127 mmol/L — ABNORMAL LOW (ref 135–145)
Total Bilirubin: 10.2 mg/dL — ABNORMAL HIGH (ref 0.0–1.2)
Total Protein: 5.9 g/dL — ABNORMAL LOW (ref 6.5–8.1)

## 2024-03-10 LAB — OSMOLALITY, URINE: Osmolality, Ur: 132 mosm/kg — ABNORMAL LOW (ref 300–900)

## 2024-03-10 LAB — OSMOLALITY: Osmolality: 294 mosm/kg (ref 275–295)

## 2024-03-10 LAB — MAGNESIUM: Magnesium: 2.5 mg/dL — ABNORMAL HIGH (ref 1.7–2.4)

## 2024-03-10 MED ORDER — BISACODYL 5 MG PO TBEC
5.0000 mg | DELAYED_RELEASE_TABLET | Freq: Every day | ORAL | Status: DC | PRN
  Administered 2024-03-10: 5 mg via ORAL
  Filled 2024-03-10: qty 1

## 2024-03-10 MED ORDER — BOOST / RESOURCE BREEZE PO LIQD CUSTOM
1.0000 | Freq: Three times a day (TID) | ORAL | Status: DC
  Administered 2024-03-10 – 2024-03-14 (×3): 1 via ORAL

## 2024-03-10 MED ORDER — PANTOPRAZOLE SODIUM 40 MG IV SOLR
40.0000 mg | Freq: Two times a day (BID) | INTRAVENOUS | Status: DC
  Administered 2024-03-10 – 2024-03-18 (×17): 40 mg via INTRAVENOUS
  Filled 2024-03-10 (×17): qty 10

## 2024-03-10 MED ORDER — FE FUM-VIT C-VIT B12-FA 460-60-0.01-1 MG PO CAPS
1.0000 | ORAL_CAPSULE | Freq: Two times a day (BID) | ORAL | Status: DC
  Administered 2024-03-10 – 2024-03-18 (×16): 1 via ORAL
  Filled 2024-03-10 (×16): qty 1

## 2024-03-10 MED ORDER — SODIUM CHLORIDE 0.9 % IV SOLN: INTRAVENOUS | Status: AC

## 2024-03-10 MED ORDER — LACTATED RINGERS IV SOLN: INTRAVENOUS | Status: DC

## 2024-03-10 NOTE — Assessment & Plan Note (Signed)
 Blood pressure currently within goal. -Initially holding antihypertensives due to risk of becoming hypotensive.-We will restart as needed

## 2024-03-10 NOTE — Evaluation (Signed)
 Physical Therapy Evaluation Patient Details Name: Stacey [name] MRN: [id number] DOB: 04-29-1946 Today's Date: 03/10/2024  History of Present Illness  Stacey [name] is a 78 y.o. 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   Clinical Impression  Pt admitted with above diagnosis. Pt currently with functional limitations due to the deficits listed below (see PT Problem List). Pt received supine in bed agreeable to PT/OT co-eval. Per pt she is bed bound at baseline and son helps her with all mobility and ADL's/IADL's. Needs assist to roll for care. Reports she has not even sat EOB for 2-3 years.   To date, pt is maxA+2 for rolling R/L for changing chuck pad. Once in side lying and using bed rails can maintain side lying. Per pt she is at her baseline. Pt made comfortable with tissue offloading and upright in bed. Time spent reviewing skin protection via positional changes and floating heels. Pt understanding with all needs in reach. Anticipate pt more appropriate for home care aid or LTC if she does not have enough family assist but otherwise to return to home environment. PT to sign off in house.      If plan is discharge home, recommend the following: A lot of help with bathing/dressing/bathroom;Assistance with cooking/housework;Assist for transportation;Help with stairs or ramp for entrance   Can travel by private vehicle        Equipment Recommendations    Recommendations for Other Services       Functional Status Assessment Patient has not had a recent decline in their functional status     Precautions / Restrictions Precautions Precautions: Fall Recall of  Precautions/Restrictions: Intact Restrictions Weight Bearing Restrictions Per Provider Order: No      Mobility  Bed Mobility Overal bed mobility: Needs Assistance Bed Mobility: Rolling Rolling: Max assist, Used rails, +2 for physical assistance         General bed mobility comments: Pt tolerated rolling R/L side with use of bed rails, MAXA +2 required for chuck sheet change. Patient Response: Cooperative  Transfers                   General transfer comment: Pt bed ridden at baseline    Ambulation/Gait               General Gait Details: pt unable at baseline  Stairs            Wheelchair Mobility     Tilt Bed Tilt Bed Patient Response: Cooperative  Modified Rankin (Stroke Patients Only)       Balance Overall balance assessment: Needs assistance   Sitting balance-Leahy Scale: Zero Sitting balance - Comments: has not sat EOB for 2-3 years since being bed bound     Standing balance-Leahy Scale: Zero                               Pertinent Vitals/Pain Pain Assessment Pain Assessment: 0-10 Pain Score: 9  Pain Location: Generalized pain Pain Descriptors / Indicators: Constant Pain Intervention(s): Monitored during session, Repositioned    Home Living Family/patient expects to be discharged to:: Private residence Living Arrangements: Children Available Help at Discharge: Family;Available  24 hours/day (son) Type of Home: Mobile home Home Access: Ramped entrance       Home Layout: One level Home Equipment: Hospital bed Additional Comments: Bed bath at baseline, uses briefs in bed for toileting,    Prior Function Prior Level of Function : Needs assist             Mobility Comments: Bed bound at baseline, all needs are met at bed level ADLs Comments: Bed bath at baseline, uses briefs in bed for toileting, son assist pt for all ADL/IADL needs. Able to feed herself and assist with rolling.     Extremity/Trunk  Assessment   Upper Extremity Assessment Upper Extremity Assessment: Generalized weakness    Lower Extremity Assessment Lower Extremity Assessment: Generalized weakness;RLE deficits/detail RLE Deficits / Details: R gastroc/soleus contracture RLE Sensation: decreased light touch RLE Coordination: decreased gross motor LLE Deficits / Details: Externally rotated LLE Sensation: decreased light touch LLE Coordination: decreased gross motor       Communication   Communication Communication: No apparent difficulties Factors Affecting Communication: Hearing impaired    Cognition Arousal: Alert Behavior During Therapy: WFL for tasks assessed/performed   PT - Cognitive impairments: No apparent impairments                         Following commands: Intact       Cueing Cueing Techniques: Verbal cues     General Comments General comments (skin integrity, edema, etc.): Pt endorses frequent skin irritations due to prolonged position in bed.    Exercises     Assessment/Plan    PT Assessment Patient does not need any further PT services  PT Problem List         PT Treatment Interventions      PT Goals (Current goals can be found in the Care Plan section)  Acute Rehab PT Goals Patient Stated Goal: get her son help to help her PT Goal Formulation: With patient Time For Goal Achievement: 03/24/24 Potential to Achieve Goals: Fair    Frequency       Co-evaluation PT/OT/SLP Co-Evaluation/Treatment: Yes Reason for Co-Treatment: Complexity of the patient's impairments (multi-system involvement);For patient/therapist safety;To address functional/ADL transfers PT goals addressed during session: Mobility/safety with mobility OT goals addressed during session: ADL's and self-care;Strengthening/ROM       AM-PAC PT "6 Clicks" Mobility  Outcome Measure Help needed turning from your back to your side while in a flat bed without using bedrails?: A Lot Help needed moving  from lying on your back to sitting on the side of a flat bed without using bedrails?: A Lot Help needed moving to and from a bed to a chair (including a wheelchair)?: Total Help needed standing up from a chair using your arms (e.g., wheelchair or bedside chair)?: Total Help needed to walk in hospital room?: Total Help needed climbing 3-5 steps with a railing? : Total 6 Click Score: 8    End of Session   Activity Tolerance: Patient tolerated treatment well Patient left: in bed;with call bell/phone within reach;with bed alarm set Nurse Communication: Mobility status PT Visit Diagnosis: Other abnormalities of gait and mobility (R26.89);Muscle weakness (generalized) (M62.81)    Time: 3086-5784 PT Time Calculation (min) (ACUTE ONLY): 24 min   Charges:   PT Evaluation $PT Eval Moderate Complexity: 1 Mod   PT General Charges $$ ACUTE PT VISIT: 1 Visit        Ora Bollig M. Fairly IV, PT, DPT Physical Therapist- [place]  [facility]  03/10/2024, 3:45 PM

## 2024-03-10 NOTE — Assessment & Plan Note (Signed)
 Patient with history of recovered EF now, last echocardiogram done in February 2024 with normal EF, it was as low as 30 to 35% in 2019 due to ischemic cardiomyopathy.  Clinically appears euvolemic -Monitor volume status closely -Daily weight -Strict intake and output

## 2024-03-10 NOTE — Assessment & Plan Note (Signed)
 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 She follows with [facility] pain clinic.

## 2024-03-10 NOTE — Plan of Care (Signed)

## 2024-03-10 NOTE — Progress Notes (Signed)
 Occupational Therapy Evaluation Patient Details Name: Stacey [name] MRN: [id number] DOB: Nov 10, 1946 Today's Date: 03/10/2024   History of Present Illness   Stacey [name] is a 78 y.o. 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     Clinical Impressions Pt was seen for OT evaluation this date. Prior to hospital admission, pt was dependent on her son for all ADL/IADLs, bed is bed bound at baseline. Pt lives with her son with a ramped entry. Pt endorsed she never gets out of bed and it has been a very long time since she has sat up at all. Pt presents to acute OT at her baseline. Pt currently requires MAXA +2 for rolling towards L/R for changing of chuck pad. Pt is able to assist when rolling with use of bed railings. Pt assisted to be made comfortable at bed level with additional pillows for adequate pressure relief. Reviewed with pt skin protective techniques while at bed level and safe ADL completion strategies. Pt reports her son is having trouble with attending to all her care while at home, anticipating pt requires assistance such as home care aid or LTC if her son is unable to provide care. Do not anticipate the need for follow up OT services upon acute hospital DC, due to pt is at her baseline. OT will sign off.       If plan is discharge home, recommend the following:   A lot of help with walking and/or transfers;A lot of help with bathing/dressing/bathroom     Functional Status Assessment   Patient has not had a recent decline in their functional status     Equipment Recommendations   Hospital bed (Current hospital bed is not functioning  corretly.)     Recommendations for Other Services         Precautions/Restrictions   Precautions Precautions: Fall Recall of Precautions/Restrictions: Intact Restrictions Weight Bearing Restrictions Per Provider Order: No     Mobility Bed Mobility Overal bed mobility: Needs Assistance Bed Mobility: Rolling Rolling: Max assist, Used rails, +2 for physical assistance         General bed mobility comments: Pt tolerated rolling R/L side with use of bed rails, MAXA +2 required for chuck sheet change.    Transfers                   General transfer comment: Pt bed ridden at baseline      Balance Overall balance assessment: Needs assistance (Pt unable to tolerate sitting/standing at baseline.)                                         ADL either performed or assessed with clinical judgement   ADL Overall ADL's : At baseline;Needs assistance/impaired Eating/Feeding: Set up;Bed level   Grooming: Wash/dry face;Brushing hair;Bed level (Anticipate set up required, bed level)                                 General ADL Comments: Pt currently at her baseline, completing all ADL tasks at bed level,  requiring set up assistance for grooming. Anticipate MAXA/Total A for UB/LB dressing.     Vision         Perception         Praxis         Pertinent Vitals/Pain Pain Assessment Pain Assessment: 0-10 Pain Score: 9  Pain Location: Generalized pain Pain Descriptors / Indicators: Constant Pain Intervention(s): Limited activity within patient's tolerance, Repositioned     Extremity/Trunk Assessment Upper Extremity Assessment Upper Extremity Assessment: Generalized weakness;Overall WFL for tasks assessed   Lower Extremity Assessment Lower Extremity Assessment: Generalized weakness;RLE deficits/detail;LLE deficits/detail RLE Deficits / Details: Externally rotated RLE Sensation: decreased light touch RLE Coordination: decreased  gross motor LLE Deficits / Details: Externally rotated LLE Sensation: decreased light touch LLE Coordination: decreased gross motor       Communication Communication Communication: No apparent difficulties Factors Affecting Communication: Hearing impaired   Cognition Arousal: Alert Behavior During Therapy: WFL for tasks assessed/performed Cognition: No apparent impairments             OT - Cognition Comments: A/O x4                 Following commands: Intact       Cueing  General Comments   Cueing Techniques: Verbal cues  Pt endorses frequent skin irritations due to prolonged position in bed.   Exercises Exercises: Other exercises Other Exercises Other Exercises: Edu: Role of OT at acute care, hand placement during rolling, importance of changing position in bed to prevent skin breakdown.   Shoulder Instructions      Home Living Family/patient expects to be discharged to:: Private residence Living Arrangements: Children Available Help at Discharge: Family;Available 24 hours/day (Son) Type of Home: Mobile home Home Access: Ramped entrance     Home Layout: One level     Bathroom Shower/Tub: Chief Strategy Officer: Standard     Home Equipment: Hospital bed   Additional Comments: Bed bath at baseline, uses briefs in bed for toileting,      Prior Functioning/Environment Prior Level of Function : Needs assist             Mobility Comments: Bed bound at baseline, all needs are met at bed level ADLs Comments: Bed bath at baseline, uses briefs in bed for toileting, son assist pt for all ADL/IADL needs. Able to feed herself and assist with rolling.    OT Problem List:     OT Treatment/Interventions:        OT Goals(Current goals can be found in the care plan section)   Acute Rehab OT Goals Patient Stated Goal: less pain OT Goal Formulation: With patient Time For Goal Achievement: 03/24/24 Potential to Achieve Goals: Poor    OT Frequency:       Co-evaluation PT/OT/SLP Co-Evaluation/Treatment: Yes Reason for Co-Treatment: Complexity of the patient's impairments (multi-system involvement);For patient/therapist safety;To address functional/ADL transfers   OT goals addressed during session: ADL's and self-care;Strengthening/ROM      AM-PAC OT "6 Clicks" Daily Activity     Outcome Measure Help from another person eating meals?: A Lot Help from another person taking care of personal grooming?: A Lot Help from another person toileting, which includes using toliet, bedpan, or urinal?: Total Help from another person bathing (including washing, rinsing, drying)?: Total Help from another person to put on and taking off regular upper body clothing?: Total Help from another person to put on and taking off regular lower body clothing?: Total 6 Click Score: 8  End of Session Equipment Utilized During Treatment: Oxygen  Activity Tolerance: Patient tolerated treatment well Patient left: in bed;with call bell/phone within reach;with bed alarm set                   Time: 1405-1430 OT Time Calculation (min): 25 min Charges:  OT General Charges $OT Visit: 1 Visit OT Evaluation $OT Eval High Complexity: 1 High  Glenard Haring M.S. OTR/L  03/10/24, 4:12 PM

## 2024-03-10 NOTE — Telephone Encounter (Addendum)
 Pt needs to be scheduled for ERCP in 2 months for stent removal.

## 2024-03-10 NOTE — Assessment & Plan Note (Signed)
 Creatinine at 2.28 today, baseline seems to be less than 1. - Giving some more IV fluid -Monitor renal function

## 2024-03-10 NOTE — Assessment & Plan Note (Signed)
-   On palliative fulvestrant  - Outpatient follow-up

## 2024-03-10 NOTE — Assessment & Plan Note (Signed)
 No acute exacerbation. - Continue home bronchodilators

## 2024-03-10 NOTE — Hospital Course (Addendum)
 Taken from H&P  [name] B Ch[place] is a 78 y.o. 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   Patient was later transferred to [facility] for ERCP.  On presentation patient was quite lethargic and somnolent with stable vital, Initial workup notable for WBC of 17.8, hemoglobin of 9.0, sodium 128, BUN 38, creat 1.91, alkaline phosphatase 1489, AST 550, ALT 171, GFR 27. Urinalysis with large hematuria, leukocytes, many bacteria. CT of the head with no intracranial abnormality. CT chest/abdomen/pelvis with no acute abnormality. MRCP with severe intra and extrahepatic biliary ductal dilation with sharp narrowing common bile duct in the pancreatic head. Patient was started on Zosyn, IV fluids.   Patient was taken for ERCP by GI which shows duodenal ulcer, stricture and ampulla stenosis.  A stent was placed with drainage of purulent discharge.  GI is recommending a repeat ERCP in 2 to 3 months for stent removal and a possible sphincterotomy off the Plavix.  4/10: Vital stable, labs with improving leukocytosis, hemoglobin decreased to 8.1, CMP with sodium of 127, chloride 95, blood glucose 123, worsening renal function with BUN of 47, creatinine 2.28, small improvement in transaminitis with AST 499, ALT 154, alkaline phosphatase 1211 and T. bili of 10.2.  Preliminary blood cultures negative.  Urine cultures pending. Ordered more IV fluid with NS.  Hyponatremia labs were also ordered Patient is wheelchair-bound at baseline, son was asking about going to rehab.  PT and OT evaluation ordered.  4/11: Hemodynamically stable with worsening renal function and hyperkalemia today.  Slight worsening of  transaminitis with improvement of alkaline phosphatase and T. bili.  Renal ultrasound with no hydronephrosis, consistent with medical renal disease and renal cell carcinoma.  Nephrology was also consulted.

## 2024-03-10 NOTE — Assessment & Plan Note (Signed)
 UA concerning for UTI, urine cultures pending. Patient is already on Zosyn -Follow-up urine culture results

## 2024-03-10 NOTE — Plan of Care (Signed)

## 2024-03-10 NOTE — Plan of Care (Signed)

## 2024-03-10 NOTE — Assessment & Plan Note (Signed)
 S/p DES to ramus and RCA. No chest pain -Will restart Plavix once cleared by GI

## 2024-03-10 NOTE — Assessment & Plan Note (Signed)
#   Obstructive Jaundice  # Transaminitis Patient presented with markedly elevated LFTs with evidence of CBD obstruction in the pancreatic head, unclear whether stone versus mass.  She does have a history of metastatic breast cancer, however no known hepatic mets. S/p ERCP by GI, found to have duodenal ulcer, duodenal and sphincter stenosis.  Stent was placed and the purulent discharge was removed. -Slight improvement in transaminitis and T. Bili -Patient need a repeat ERCP in 2 to 3 months for stent removal -Continue with Zosyn -Monitor liver function

## 2024-03-10 NOTE — Assessment & Plan Note (Signed)
 Sodium at 127 today. -Check hyponatremia labs -Giving some more IV fluid -Monitor sodium

## 2024-03-10 NOTE — Progress Notes (Signed)
 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  The patient's liver enzymes have come down slightly and the white cell count is also trending down.  The patient continues to be somewhat lethargic.  Upon placement of the stents yesterday there was drainage of purulent material.   Objective: Vital signs in last 24 hours: Vitals:   03/10/24 0200 03/10/24 0300 03/10/24 0400 03/10/24 0500  BP:  103/84 (!) 110/54   Pulse: (!) 111 88 87   Resp: 11 16 17    Temp: 98.4 F (36.9 C)  98.1 F (36.7 C)   TempSrc:   Oral   SpO2: 95%  96%   Weight:    84.2 kg  Height:       Weight change:   Intake/Output Summary (Last 24 hours) at 03/10/2024 1308 Last data filed at 03/10/2024 0600 Gross per 24 hour  Intake 73.71 ml  Output 500 ml  Net -426.29 ml     Exam: Heart:: Regular rate and rhythm, S1S2 present, or without murmur or extra heart sounds Lungs: normal and clear to auscultation and percussion Abdomen: soft, nontender, normal bowel sounds   Lab Results: @LABTEST2 @ Micro Results: Recent Results (from the past 240 hours)  Blood culture (routine x 2)     Status: None (Preliminary result)   Collection Time: 03/08/24 11:53 PM   Specimen: BLOOD  Result Value Ref Range Status   Specimen Description BLOOD BLOOD LEFT HAND  Final   Special Requests   Final    BOTTLES DRAWN AEROBIC AND ANAEROBIC Blood Culture adequate volume   Culture   Final    NO GROWTH 2 DAYS Performed at [facility], [address] [place], [place] [address]    Report Status PENDING  Incomplete  Blood culture (routine x 2)     Status: None (Preliminary result)   Collection Time: 03/08/24 11:55 PM   Specimen: BLOOD  Result Value Ref  Range Status   Specimen Description BLOOD BLOOD LEFT HAND  Final   Special Requests   Final    BOTTLES DRAWN AEROBIC ONLY Blood Culture adequate volume   Culture   Final    NO GROWTH 2 DAYS Performed at [facility], [address], [place], [place] [address]    Report Status PENDING  Incomplete   Studies/Results: DG C-Arm 1-60 Min-No Report Result Date: 03/09/2024 Fluoroscopy was utilized by the requesting physician.  No radiographic interpretation.   MR ABDOMEN MRCP W WO CONTAST Result Date: 03/09/2024 CLINICAL DATA:  Jaundice, metastatic breast cancer EXAM: MRI ABDOMEN WITHOUT AND WITH CONTRAST (INCLUDING MRCP) TECHNIQUE: Multiplanar multisequence MR imaging of the abdomen was performed both before and after the administration of intravenous contrast. Heavily T2-weighted images of the biliary and pancreatic ducts were obtained, and three-dimensional MRCP images were rendered by post processing. CONTRAST:  9mL GADAVIST GADOBUTROL 1 MMOL/ML IV SOLN COMPARISON:  CT chest abdomen pelvis, 03/08/2024, 01/01/2024 FINDINGS: Examination is unfortunately very limited by breath motion artifact as well as susceptibility and saturation artifact arising from a subcutaneous pain pump in the soft tissues of the right upper quadrant. Lower chest: No acute abnormality. Calcified mass in the right breast (series 5, image 4). Hepatobiliary: No focal liver abnormality is seen. Status post cholecystectomy. Severe  intra and extrahepatic biliary ductal dilatation with periportal edema. This appears to be increased when compared to prior contrast enhanced CT dated 01/01/2024. The central common bile duct is very difficult to assess due to artifact and image quality but appears to sharply narrow within the pancreatic head (series 15, image 49). Pancreas: Unremarkable. No pancreatic ductal dilatation or surrounding inflammatory changes. Spleen: Normal in size without significant abnormality. Adrenals/Urinary Tract: Adrenal  glands are unremarkable. Heterogeneously enhancing mass in the peripheral superior pole of the left kidney measuring 2.7 x 2.5 cm (series 15, image 37). Right kidney is normal, without obvious renal calculi, solid lesion, or hydronephrosis. Stomach/Bowel: Stomach is within normal limits. No evidence of bowel wall thickening, distention, or inflammatory changes. Vascular/Lymphatic: Severe aortic atherosclerosis. No enlarged abdominal lymph nodes. Other: No abdominal wall hernia or abnormality. No ascites. Musculoskeletal: No acute or significant osseous findings. IMPRESSION: 1. Examination is unfortunately very limited by breath motion artifact as well as susceptibility and saturation artifact arising from a subcutaneous pain pump in the soft tissues of the right upper quadrant. 2. Within this limitation, severe intra and extrahepatic biliary ductal dilatation with periportal edema. This appears to be increased when compared to prior contrast enhanced CT dated 01/01/2024. The central common bile duct is very difficult to assess due to artifact and image quality but appears to sharply narrow within the pancreatic head. Findings are highly concerning for obstructing calculus or mass; repeat contrast enhanced CT may be helpful to better assess given severe technical limitations of this MR. 3. Heterogeneously enhancing mass in the peripheral superior pole of the left kidney measuring 2.7 x 2.5 cm, consistent with renal cell carcinoma. Aortic Atherosclerosis (ICD10-I70.0). Electronically Signed   By: Jearld Lesch M.D.   On: 03/09/2024 07:46   MR 3D Recon At Scanner Result Date: 03/09/2024 CLINICAL DATA:  Jaundice, metastatic breast cancer EXAM: MRI ABDOMEN WITHOUT AND WITH CONTRAST (INCLUDING MRCP) TECHNIQUE: Multiplanar multisequence MR imaging of the abdomen was performed both before and after the administration of intravenous contrast. Heavily T2-weighted images of the biliary and pancreatic ducts were obtained, and  three-dimensional MRCP images were rendered by post processing. CONTRAST:  9mL GADAVIST GADOBUTROL 1 MMOL/ML IV SOLN COMPARISON:  CT chest abdomen pelvis, 03/08/2024, 01/01/2024 FINDINGS: Examination is unfortunately very limited by breath motion artifact as well as susceptibility and saturation artifact arising from a subcutaneous pain pump in the soft tissues of the right upper quadrant. Lower chest: No acute abnormality. Calcified mass in the right breast (series 5, image 4). Hepatobiliary: No focal liver abnormality is seen. Status post cholecystectomy. Severe intra and extrahepatic biliary ductal dilatation with periportal edema. This appears to be increased when compared to prior contrast enhanced CT dated 01/01/2024. The central common bile duct is very difficult to assess due to artifact and image quality but appears to sharply narrow within the pancreatic head (series 15, image 49). Pancreas: Unremarkable. No pancreatic ductal dilatation or surrounding inflammatory changes. Spleen: Normal in size without significant abnormality. Adrenals/Urinary Tract: Adrenal glands are unremarkable. Heterogeneously enhancing mass in the peripheral superior pole of the left kidney measuring 2.7 x 2.5 cm (series 15, image 37). Right kidney is normal, without obvious renal calculi, solid lesion, or hydronephrosis. Stomach/Bowel: Stomach is within normal limits. No evidence of bowel wall thickening, distention, or inflammatory changes. Vascular/Lymphatic: Severe aortic atherosclerosis. No enlarged abdominal lymph nodes. Other: No abdominal wall hernia or abnormality. No ascites. Musculoskeletal: No acute or significant osseous findings. IMPRESSION: 1. Examination is unfortunately very limited by  breath motion artifact as well as susceptibility and saturation artifact arising from a subcutaneous pain pump in the soft tissues of the right upper quadrant. 2. Within this limitation, severe intra and extrahepatic biliary ductal  dilatation with periportal edema. This appears to be increased when compared to prior contrast enhanced CT dated 01/01/2024. The central common bile duct is very difficult to assess due to artifact and image quality but appears to sharply narrow within the pancreatic head. Findings are highly concerning for obstructing calculus or mass; repeat contrast enhanced CT may be helpful to better assess given severe technical limitations of this MR. 3. Heterogeneously enhancing mass in the peripheral superior pole of the left kidney measuring 2.7 x 2.5 cm, consistent with renal cell carcinoma. Aortic Atherosclerosis (ICD10-I70.0). Electronically Signed   By: Jearld Lesch M.D.   On: 03/09/2024 07:46   CT CHEST ABDOMEN PELVIS WO CONTRAST Result Date: 03/08/2024 CLINICAL DATA:  History of metastatic breast cancer with weakness, body aches, and lethargy associated with nausea and vomiting. * Tracking Code: BO * EXAM: CT CHEST, ABDOMEN AND PELVIS WITHOUT CONTRAST TECHNIQUE: Multidetector CT imaging of the chest, abdomen and pelvis was performed following the standard protocol without IV contrast. RADIATION DOSE REDUCTION: This exam was performed according to the departmental dose-optimization program which includes automated exposure control, adjustment of the mA and/or kV according to patient size and/or use of iterative reconstruction technique. COMPARISON:  CT chest, abdomen, and pelvis dated 01/01/2024 FINDINGS: CT CHEST FINDINGS Cardiovascular: Normal heart size. No significant pericardial fluid/thickening. Great vessels are normal in course and caliber. Coronary artery calcifications. Mediastinum/Nodes: Imaged thyroid gland without nodules meeting criteria for imaging follow-up by size. Normal esophagus. No pathologically enlarged axillary, supraclavicular, mediastinal, or hilar lymph nodes. Lungs/Pleura: The central airways are patent. Adherent secretions within the trachea. Unchanged irregular right apical nodule  measuring 7 x 6 mm (3:34). No new or enlarging pulmonary nodules. No pneumothorax. No pleural effusion. Musculoskeletal: No acute or abnormal lytic or blastic osseous lesions. Multilevel degenerative changes of the thoracic spine. Unchanged calcified mass in the right breast. Punctate foci of gas within the right anterior chest appears anti dependent within small vessels in the right subclavian vein. CT ABDOMEN PELVIS FINDINGS Hepatobiliary: No focal hepatic lesions. Mild intrahepatic bile duct dilation, which may be related to cholecystectomy. Trace pneumobilia at the hepatic hilum. Pancreas: No focal lesions or main ductal dilation. Spleen: Normal in size without focal abnormality. Adrenals/Urinary Tract: No adrenal nodules. No calculi or hydronephrosis. Left upper pole renal mass, better evaluated on prior contrast-enhanced examination. No focal bladder wall thickening. Stomach/Bowel: Normal appearance of the stomach. No evidence of bowel wall thickening, distention, or inflammatory changes. Large volume stool within the rectum. Moderate volume stool within the descending and sigmoid colon. Appendix is not discretely seen. Vascular/Lymphatic: Aortic atherosclerosis. No enlarged abdominal or pelvic lymph nodes. Reproductive: No adnexal masses. Other: No free fluid, fluid collection, or free air. Calcified nodule along the left anterior peritoneum, likely sequela of prior infection/inflammation. Musculoskeletal: Similar sclerotic lesion at L1-2 with L1 compression fracture and retropulsion of fracture fragments. Unchanged cortical irregularity of left inferior pubic ramus. Multilevel degenerative changes of the lumbar spine. Again seen is infusion pump within the right anterior abdominal wall. IMPRESSION: 1. No acute abnormality in the chest, abdomen, or pelvis. 2. Unchanged calcified right breast mass and irregular right apical nodule. No new or enlarging pulmonary nodules. 3. Similar sclerotic lesion at L1-2 with  L1 compression fracture and retropulsion of fracture fragments. 4. Large  volume stool within the rectum. Moderate volume stool within the descending and sigmoid colon. Recommend correlation with constipation. 5. Left upper pole renal mass is better evaluated on prior contrast-enhanced examination. 6. Aortic Atherosclerosis (ICD10-I70.0). Coronary artery calcifications. Assessment for potential risk factor modification, dietary therapy or pharmacologic therapy may be warranted, if clinically indicated. Electronically Signed   By: Agustin Cree M.D.   On: 03/08/2024 20:56   CT Head Wo Contrast Result Date: 03/08/2024 CLINICAL DATA:  Mental status change, unknown cause EXAM: CT HEAD WITHOUT CONTRAST TECHNIQUE: Contiguous axial images were obtained from the base of the skull through the vertex without intravenous contrast. RADIATION DOSE REDUCTION: This exam was performed according to the departmental dose-optimization program which includes automated exposure control, adjustment of the mA and/or kV according to patient size and/or use of iterative reconstruction technique. COMPARISON:  CT head 05/01/2021 FINDINGS: Brain: No evidence of large-territorial acute infarction. No parenchymal hemorrhage. No mass lesion. No extra-axial collection. No mass effect or midline shift. No hydrocephalus. Basilar cisterns are patent. Vascular: No hyperdense vessel. Atherosclerotic calcifications are present within the cavernous internal carotid arteries. Skull: No acute fracture or focal lesion. Sinuses/Orbits: Paranasal sinuses and mastoid air cells are clear. The orbits are unremarkable. Other: None. IMPRESSION: No acute intracranial abnormality. Electronically Signed   By: Tish Frederickson M.D.   On: 03/08/2024 20:09   US Abdomen Limited Result Date: 03/08/2024 CLINICAL DATA:  Jaundice EXAM: ULTRASOUND ABDOMEN LIMITED RIGHT UPPER QUADRANT COMPARISON:  CT abdomen pelvis 03/08/2024, CT abdomen pelvis 01/01/2024 FINDINGS: Gallbladder:  Status post cholecystectomy. Common bile duct: Diameter: Thickened measuring up to 14 mm with associated echogenic avascular material within the common bile duct. Liver: No focal lesion identified. Within normal limits in parenchymal echogenicity. Portal vein is patent on color Doppler imaging with normal direction of blood flow towards the liver. Pneumobilia noted. Intrahepatic ducts measure at the upper limits of normal. Other: None. IMPRESSION: 1. Thickened common bile duct with echogenic debris within the lumen. Intrahepatic ducts measure at the upper limits of normal. Finding could be sludge versus less likely a mass (given no vasculature identified). When the patient is clinically stable and able to follow directions and hold their breath (preferably as an outpatient) further evaluation with dedicated abdominal MRI should be considered. 2. Status post cholecystectomy. Electronically Signed   By: Tish Frederickson M.D.   On: 03/08/2024 20:07   Medications: I have reviewed the patient's current medications. Scheduled Meds:  sodium chloride flush  3 mL Intravenous Q12H   Continuous Infusions:  piperacillin-tazobactam (ZOSYN)  IV 3.375 g (03/10/24 0444)   PRN Meds:.acetaminophen **OR** acetaminophen, HYDROmorphone (DILAUDID) injection, HYDROmorphone, ipratropium-albuterol, ondansetron **OR** ondansetron (ZOFRAN) IV   Assessment: Principal Problem:   Acute cholangitis Active Problems:   Primary malignant neoplasm of breast with metastasis (HCC)   Transaminitis   COPD (chronic obstructive pulmonary disease) (HCC)   CAD in native artery   Pain of metastatic malignancy   Chronic combined systolic and diastolic CHF (congestive heart failure) (HCC)   AKI (acute kidney injury) (HCC)   Obstructive jaundice   Pyuria   Hyponatremia   Duodenal stenosis    Plan: This patient status post ERCP with stent placement for what appears to be cholangitis.  The patient continues to have lethargy.  The  blood count has gone down and should be monitored and transfused as needed.  The liver enzymes have started to slowly trend down.  The white cell count is also improved.  The patient should have a  repeat ERCP in 2 to 3 months for stent removal and possible sphincterotomy off Plavix.  The patient should continue antibiotics and should be on a PPI for her duodenitis.  I will start the PPI on this patient.   LOS: 1 day   [name], MD.FACG 03/10/2024, 8:23 AM Pager [phone] 7am-5pm  Check AMION for 5pm -7am coverage and on weekends

## 2024-03-10 NOTE — Progress Notes (Signed)
 Progress Note   Patient: Stacey [name] VWU:[id number] DOB: [birth year] DOA: 03/09/2024     1 DOS: the patient was seen and examined on 03/10/2024   Brief hospital course: Taken from H&P  [name] is a 78 y.o. 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   Patient was later transferred to [facility] for ERCP.  On presentation patient was quite lethargic and somnolent with stable vital, Initial workup notable for WBC of 17.8, hemoglobin of 9.0, sodium 128, BUN 38, creat 1.91, alkaline phosphatase 1489, AST 550, ALT 171, GFR 27. Urinalysis with large hematuria, leukocytes, many bacteria. CT of the head with no intracranial abnormality. CT chest/abdomen/pelvis with no acute abnormality. MRCP with severe intra and extrahepatic biliary ductal dilation with sharp narrowing common bile duct in the pancreatic head. Patient was started on Zosyn, IV fluids.   Patient was taken for ERCP by GI which shows duodenal ulcer, stricture and ampulla stenosis.  A stent was placed with drainage of purulent discharge.  GI is recommending a repeat ERCP in 2 to 3 months for stent removal and a possible sphincterotomy off the Plavix.  4/10: Vital stable, labs with improving leukocytosis, hemoglobin decreased to 8.1, CMP with sodium of 127, chloride 95, blood glucose 123, worsening renal function with BUN of 47, creatinine 2.28, small improvement in transaminitis with AST 499, ALT 154, alkaline phosphatase 1211 and T. bili of 10.2.  Preliminary blood cultures negative.  Urine cultures pending. Ordered more IV fluid with NS.  Hyponatremia labs were also ordered Patient is wheelchair-bound at  baseline, son was asking about going to rehab.  PT and OT evaluation ordered.  Assessment and Plan: * Acute cholangitis # Obstructive Jaundice  # Transaminitis Patient presented with markedly elevated LFTs with evidence of CBD obstruction in the pancreatic head, unclear whether stone versus mass.  She does have a history of metastatic breast cancer, however no known hepatic mets. S/p ERCP by GI, found to have duodenal ulcer, duodenal and sphincter stenosis.  Stent was placed and the purulent discharge was removed. -Slight improvement in transaminitis and T. Bili -Patient need a repeat ERCP in 2 to 3 months for stent removal -Continue with Zosyn -Monitor liver function  AKI (acute kidney injury) (HCC) Creatinine at 2.28 today, baseline seems to be less than 1. - Giving some more IV fluid -Monitor renal function  Hyponatremia Sodium at 127 today. 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 She follows with [facility] pain clinic.   Chronic combined systolic and diastolic CHF (congestive heart failure) (HCC) Patient with history of recovered EF now, last echocardiogram done in February 2024 with normal EF, it was as low as 30 to 35% in 2019 due to ischemic cardiomyopathy.  Clinically appears euvolemic -Monitor volume status closely -Daily weight -Strict intake and output  CAD in native artery S/p DES to ramus and RCA. No chest pain -Will restart Plavix once cleared by GI  COPD (chronic obstructive pulmonary disease) (HCC) No acute exacerbation. - Continue home bronchodilators  Pyuria UA concerning for UTI,  urine cultures pending. 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 -Initially holding antihypertensives due to risk of becoming hypotensive.-We will restart as needed  Acute on chronic anemia Hemoglobin decreased to 8.1 today.  No obvious bleeding. -Started on supplement -Monitor hemoglobin -Transfuse if below 7      Subjective: Patient was feeling much improved after the procedure.  Still having mild RUQ pain.  Physical Exam: Vitals:   03/10/24 0300 03/10/24 0400 03/10/24 0500 03/10/24 0840  BP: 103/84 (!) 110/54  125/89  Pulse: 88 87  91  Resp: 16 17    Temp:  98.1 F (36.7 C)  97.9 F (36.6 C)  TempSrc:  Oral  Oral  SpO2:  96%  97%  Weight:   84.2 kg   Height:       General.  Obese lady, in no acute distress.  Significant skin pallor and scleral icterus Pulmonary.  Lungs clear bilaterally, normal respiratory effort. CV.  Regular rate and rhythm, no JVD, rub or murmur. Abdomen.  Soft, mild RUQ tenderness, nondistended, BS positive. CNS.  Alert and oriented .  No focal neurologic deficit. Extremities.  1+ LE edema, pulses intact and symmetrical.  Data Reviewed: Prior data reviewed  Family Communication: Discussed with son and daughter at bedside  Disposition: Status is: Inpatient Remains inpatient appropriate because: Severity of illness  Planned Discharge Destination:  To be determined  Time spent: 50 minutes  This record has been created using Conservation officer, historic buildings. Errors have been sought and corrected,but may not always be located. Such creation errors do not reflect on the standard of care.   Author: Arnetha Courser, MD 03/10/2024 1:46 PM  For on call review [email or web address].

## 2024-03-10 NOTE — Assessment & Plan Note (Signed)
 Hemoglobin decreased to 8.1 today.  No obvious bleeding. -Started on supplement -Monitor hemoglobin -Transfuse if below 7

## 2024-03-11 ENCOUNTER — Inpatient Hospital Stay

## 2024-03-11 DIAGNOSIS — R7401 Elevation of levels of liver transaminase levels: Secondary | ICD-10-CM | POA: Diagnosis not present

## 2024-03-11 DIAGNOSIS — K269 Duodenal ulcer, unspecified as acute or chronic, without hemorrhage or perforation: Secondary | ICD-10-CM

## 2024-03-11 DIAGNOSIS — K831 Obstruction of bile duct: Secondary | ICD-10-CM | POA: Diagnosis not present

## 2024-03-11 DIAGNOSIS — K8309 Other cholangitis: Secondary | ICD-10-CM | POA: Diagnosis not present

## 2024-03-11 DIAGNOSIS — N179 Acute kidney failure, unspecified: Secondary | ICD-10-CM | POA: Diagnosis not present

## 2024-03-11 DIAGNOSIS — K315 Obstruction of duodenum: Secondary | ICD-10-CM | POA: Diagnosis not present

## 2024-03-11 DIAGNOSIS — E875 Hyperkalemia: Secondary | ICD-10-CM

## 2024-03-11 LAB — COMPREHENSIVE METABOLIC PANEL WITH GFR
ALT: 176 U/L — ABNORMAL HIGH (ref 0–44)
AST: 576 U/L — ABNORMAL HIGH (ref 15–41)
Albumin: 1.8 g/dL — ABNORMAL LOW (ref 3.5–5.0)
Alkaline Phosphatase: 1045 U/L — ABNORMAL HIGH (ref 38–126)
Anion gap: 11 (ref 5–15)
BUN: 46 mg/dL — ABNORMAL HIGH (ref 8–23)
CO2: 21 mmol/L — ABNORMAL LOW (ref 22–32)
Calcium: 7.7 mg/dL — ABNORMAL LOW (ref 8.9–10.3)
Chloride: 98 mmol/L (ref 98–111)
Creatinine, Ser: 2.43 mg/dL — ABNORMAL HIGH (ref 0.44–1.00)
GFR, Estimated: 20 mL/min — ABNORMAL LOW (ref >60)
Glucose, Bld: 114 mg/dL — ABNORMAL HIGH (ref 70–99)
Potassium: 5.2 mmol/L — ABNORMAL HIGH (ref 3.5–5.1)
Sodium: 130 mmol/L — ABNORMAL LOW (ref 135–145)
Total Bilirubin: 7 mg/dL — ABNORMAL HIGH (ref 0.0–1.2)
Total Protein: 6.2 g/dL — ABNORMAL LOW (ref 6.5–8.1)

## 2024-03-11 LAB — CBC
HCT: 23.9 % — ABNORMAL LOW (ref 36.0–46.0)
Hemoglobin: 8 g/dL — ABNORMAL LOW (ref 12.0–15.0)
MCH: 30.8 pg (ref 26.0–34.0)
MCHC: 33.5 g/dL (ref 30.0–36.0)
MCV: 91.9 fL (ref 80.0–100.0)
Platelets: 264 10*3/uL (ref 150–400)
RBC: 2.6 MIL/uL — ABNORMAL LOW (ref 3.87–5.11)
RDW: 17.1 % — ABNORMAL HIGH (ref 11.5–15.5)
WBC: 12 10*3/uL — ABNORMAL HIGH (ref 4.0–10.5)
nRBC: 0 % (ref 0.0–0.2)

## 2024-03-11 LAB — URINE CULTURE: Culture: >=100000 — AB

## 2024-03-11 MED ORDER — PIPERACILLIN-TAZOBACTAM 3.375 G IVPB
3.3750 g | Freq: Two times a day (BID) | INTRAVENOUS | Status: AC
  Administered 2024-03-11 – 2024-03-15 (×9): 3.375 g via INTRAVENOUS
  Filled 2024-03-11 (×10): qty 50

## 2024-03-11 MED ORDER — SODIUM CHLORIDE 0.9 % IV SOLN: INTRAVENOUS | Status: AC

## 2024-03-11 MED ORDER — SODIUM ZIRCONIUM CYCLOSILICATE 10 G PO PACK
10.0000 g | PACK | Freq: Every day | ORAL | Status: DC
  Administered 2024-03-11 – 2024-03-15 (×5): 10 g via ORAL
  Filled 2024-03-11 (×5): qty 1

## 2024-03-11 NOTE — Assessment & Plan Note (Signed)
 Likely due to worsening renal function.  Potassium at 5.2 today -Add Lokelma -Monitor potassium

## 2024-03-11 NOTE — Assessment & Plan Note (Signed)
 Worsening creatinine at 2.43 today, baseline seems to be less than 1.  Renal ultrasound with no hydronephrosis, consistent with medical renal disease and prior diagnosis of renal cell carcinoma -Nephrology was consulted -Monitor renal function -Avoid nephrotoxins

## 2024-03-11 NOTE — Assessment & Plan Note (Signed)
#   Obstructive Jaundice  # Transaminitis Patient presented with markedly elevated LFTs with evidence of CBD obstruction in the pancreatic head, unclear whether stone versus mass.  She does have a history of metastatic breast cancer, however no known hepatic mets. S/p ERCP by GI, found to have duodenal ulcer, duodenal and sphincter stenosis.  Stent was placed and the purulent discharge was removed. -Slight worsening of AST and ALT, and small improvement in alkaline phosphatase and T. bili -Patient need a repeat ERCP in 2 to 3 months for stent removal -Continue with Zosyn -Monitor liver function

## 2024-03-11 NOTE — Progress Notes (Signed)
 Progress Note   Patient: Stacey [name] NFA:[id number] DOB: May 01, 1946 DOA: 03/09/2024     2 DOS: the patient was seen and examined on 03/11/2024   Brief hospital course: Taken from H&P  [name] is a 78 y.o. 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   Patient was later transferred to [facility] for ERCP.  On presentation patient was quite lethargic and somnolent with stable vital, Initial workup notable for WBC of 17.8, hemoglobin of 9.0, sodium 128, BUN 38, creat 1.91, alkaline phosphatase 1489, AST 550, ALT 171, GFR 27. Urinalysis with large hematuria, leukocytes, many bacteria. CT of the head with no intracranial abnormality. CT chest/abdomen/pelvis with no acute abnormality. MRCP with severe intra and extrahepatic biliary ductal dilation with sharp narrowing common bile duct in the pancreatic head. Patient was started on Zosyn, IV fluids.   Patient was taken for ERCP by GI which shows duodenal ulcer, stricture and ampulla stenosis.  A stent was placed with drainage of purulent discharge.  GI is recommending a repeat ERCP in 2 to 3 months for stent removal and a possible sphincterotomy off the Plavix.  4/10: Vital stable, labs with improving leukocytosis, hemoglobin decreased to 8.1, CMP with sodium of 127, chloride 95, blood glucose 123, worsening renal function with BUN of 47, creatinine 2.28, small improvement in transaminitis with AST 499, ALT 154, alkaline phosphatase 1211 and T. bili of 10.2.  Preliminary blood cultures negative.  Urine cultures pending. Ordered more IV fluid with NS.  Hyponatremia labs were also ordered Patient is wheelchair-bound at  baseline, son was asking about going to rehab.  PT and OT evaluation ordered.  4/11: Hemodynamically stable with worsening renal function and hyperkalemia today.  Slight worsening of transaminitis with improvement of alkaline phosphatase and T. bili.  Renal ultrasound with no hydronephrosis, consistent with medical renal disease and renal cell carcinoma.  Nephrology was also consulted.  Assessment and Plan: * Acute cholangitis # Obstructive Jaundice  # Transaminitis Patient presented with markedly elevated LFTs with evidence of CBD obstruction in the pancreatic head, unclear whether stone versus mass.  She does have a history of metastatic breast cancer, however no known hepatic mets. S/p ERCP by GI, found to have duodenal ulcer, duodenal and sphincter stenosis.  Stent was placed and the purulent discharge was removed. -Slight worsening of AST and ALT, and small improvement in alkaline phosphatase and T. bili -Patient need a repeat ERCP in 2 to 3 months for stent removal -Continue with Zosyn -Monitor liver function  AKI (acute kidney injury) (HCC) Worsening creatinine at 2.43 today, baseline seems to be less than 1.  Renal ultrasound with no hydronephrosis, consistent with medical renal disease and prior diagnosis of renal cell carcinoma -Nephrology was consulted -Monitor renal function -Avoid nephrotoxins  Hyperkalemia Likely due to worsening renal function.  Potassium at 5.2 today -Add Lokelma -Monitor potassium  Hyponatremia Sodium at 130 today.  Hyponatremia labs with low urine osmolality, urine sodium of 11 and normal serum osmolality. 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 She follows with [facility] pain clinic.   Chronic  combined systolic and diastolic CHF (congestive heart failure) (HCC) Patient with history of recovered EF now,  last echocardiogram done in February 2024 with normal EF, it was as low as 30 to 35% in 2019 due to ischemic cardiomyopathy.  Clinically appears euvolemic -Monitor volume status closely -Daily weight -Strict intake and output  CAD in native artery S/p DES to ramus and RCA. No chest pain -Will restart Plavix once cleared by GI  COPD (chronic obstructive pulmonary disease) (HCC) No acute exacerbation. - Continue home bronchodilators  Pyuria UA concerning for UTI, urine cultures pending. Patient is already on Zosyn -Follow-up urine culture results  Primary malignant neoplasm of breast with metastasis (HCC) - On palliative fulvestrant  - Outpatient follow-up  Hypertension Blood pressure currently within goal. -Initially holding antihypertensives due to risk of becoming hypotensive.-We will restart as needed  Acute on chronic anemia Hemoglobin decreased to 8.1 today.  No obvious bleeding. -Started on supplement -Monitor hemoglobin -Transfuse if below 7      Subjective: Patient was seen and examined today.  No new concern.  She was feeling much better.  Denies any urinary symptoms.  Physical Exam: Vitals:   03/11/24 0500 03/11/24 0745 03/11/24 0840 03/11/24 1158  BP: (!) 104/48  (!) 111/52 133/75  Pulse: 82 80 83 89  Resp: 12 15 11 15   Temp:  97.7 F (36.5 C) 97.7 F (36.5 C) (!) 97.4 F (36.3 C)  TempSrc:  Oral Oral Oral  SpO2: 98% 95% 97% 97%  Weight: 89 kg     Height:       General.  Obese elderly lady, in no acute distress.  Improving skin pallor and scleral icterus but still noticeable Pulmonary.  Lungs clear bilaterally, normal respiratory effort. CV.  Regular rate and rhythm, no JVD, rub or murmur. Abdomen.  Soft, nontender, nondistended, BS positive. CNS.  Alert and oriented .  No focal neurologic deficit. Extremities.  Trace LE edema, no cyanosis, pulses intact and symmetrical.  Data Reviewed: Prior data reviewed  Family Communication:    Disposition: Status is: Inpatient Remains inpatient appropriate because: Severity of illness  Planned Discharge Destination:  To be determined  Time spent: 50 minutes  This record has been created using Conservation officer, historic buildings. Errors have been sought and corrected,but may not always be located. Such creation errors do not reflect on the standard of care.   Author: Arnetha Courser, MD 03/11/2024 3:29 PM  For on call review [email or web address].

## 2024-03-11 NOTE — Consult Note (Signed)
 [facility] CONSULT NOTE    Date: 03/11/2024                  Patient Name:  Stacey [name]  MRN: [id number]  DOB: Sep 22, 1946  Age / Sex: 78 y.o., female         PCP: Ignatius Specking, MD                 Service Requesting Consult: Hospitalist                 Reason for Consult: Acute kidney injury            History of Present Illness: Patient is a 78 y.o. female with a PMHx of hypertension, hyperlipidemia, coronary artery disease s/p DES x 2, staage IV metastatic breast cancer to the bone, chronic pain syndrome, left renal cell carcinoma, who was admitted to [facility] on 03/09/2024 for evaluation of acute cholangitis, obstructive jaundice, and sepsis.  Initial imaging demonstrated severe intra and extrahepatic biliary ductal dilatation with periportal edema.  In addition there was a heterogeneously enhancing mass in the peripheral superior pole of the left kidney.  She subsequently underwent ERCP and found to have duodenal stricture, duodenal ulcers, and ampullary stenosis.  Biliary stent was subsequently placed.  We were asked to see the patient for evaluation management of acute kidney injury.  Earlier this a.m. creatinine was up to 2.43.  Baseline creatinine appears to be 0.61.   Medications: Outpatient medications: Medications Prior to Admission  Medication Sig Dispense Refill Last Dose/Taking   diclofenac Sodium (VOLTAREN) 1 % GEL Apply topically.      HYDROmorphone (DILAUDID) 1 MG/ML injection Inject 0.5 mLs (0.5 mg total) into the vein every 2 (two) hours as needed for severe pain (pain score 7-10) or moderate pain (pain score 4-6).      lactated ringers infusion Inject 80 mLs into the vein continuous.      ondansetron (ZOFRAN) 4 MG/2ML SOLN injection Inject 2 mLs (4 mg total) into the vein every 6 (six) hours as needed for nausea or vomiting.      piperacillin-tazobactam (ZOSYN) 3.375 GM/50ML IVPB Inject 50 mLs (3.375 g total) into the vein every 8 (eight)  hours.       Current medications: Current Facility-Administered Medications  Medication Dose Route Frequency Provider Last Rate Last Admin   0.9 %  sodium chloride infusion   Intravenous Continuous Lequan Dobratz, MD 75 mL/hr at 03/11/24 1436 New Bag at 03/11/24 1436   acetaminophen (TYLENOL) tablet 650 mg  650 mg Oral Q6H PRN Verdene Lennert, MD       Or   acetaminophen (TYLENOL) suppository 650 mg  650 mg Rectal Q6H PRN Verdene Lennert, MD       bisacodyl (DULCOLAX) EC tablet 5 mg  5 mg Oral Daily PRN Arnetha Courser, MD   5 mg at 03/10/24 1134   Fe Fum-Vit C-Vit B12-FA (TRIGELS-F FORTE) capsule 1 capsule  1 capsule Oral BID Arnetha Courser, MD   1 capsule at 03/11/24 1016   feeding supplement (BOOST / RESOURCE BREEZE) liquid 1 Container  1 Container Oral TID BM Arnetha Courser, MD   1 Container at 03/10/24 2102   HYDROmorphone (DILAUDID) injection 0.5 mg  0.5 mg Intravenous Q4H PRN Verdene Lennert, MD   0.5 mg at 03/11/24 1445   HYDROmorphone (DILAUDID) tablet 2-4 mg  2-4 mg Oral Q6H PRN Verdene Lennert, MD   4 mg at 03/11/24 1321   ipratropium-albuterol (DUONEB)  0.5-2.5 (3) MG/3ML nebulizer solution 3 mL  3 mL Nebulization Q4H PRN Verdene Lennert, MD       ondansetron (ZOFRAN) tablet 4 mg  4 mg Oral Q6H PRN Verdene Lennert, MD       Or   ondansetron (ZOFRAN) injection 4 mg  4 mg Intravenous Q6H PRN Verdene Lennert, MD       pantoprazole (PROTONIX) injection 40 mg  40 mg Intravenous Q12H [name], MD   40 mg at 03/11/24 1017   piperacillin-tazobactam (ZOSYN) IVPB 3.375 g  3.375 g Intravenous Q12H Arnetha Courser, MD       sodium chloride flush (NS) 0.9 % injection 3 mL  3 mL Intravenous Q12H Verdene Lennert, MD   3 mL at 03/11/24 1017   sodium zirconium cyclosilicate (LOKELMA) packet 10 g  10 g Oral Daily Arnetha Courser, MD   10 g at 03/11/24 1016      Allergies: Allergies  Allergen Reactions   Brilinta [Ticagrelor] Diarrhea and Nausea Only    Nausea and severe diarrhea- general  weakness. Patient says does not want to take again   Budesonide Palpitations   Albuterol Other (See Comments)    Heart racing       Past Medical History: Past Medical History:  Diagnosis Date   CAD in native artery    a. DES to ramus 2005 with late stent thrombosis 2006 tx with PTCA. 12/18 PCI/DES x1 to mRCA, EF 50-55%   CHF (congestive heart failure) (HCC)    Chronic pain    COPD (chronic obstructive pulmonary disease) (HCC)    Hyperlipidemia    Hypertension    Left bundle branch block    Lymphedema    Metastatic breast cancer    a. to bone.   MI (myocardial infarction) (HCC)    Mild aortic stenosis 10/2017   Morbid obesity (HCC)    PAF (paroxysmal atrial fibrillation) (HCC)    PSVT (paroxysmal supraventricular tachycardia) (HCC)    a. per [facility] notes, seen on event monitor in 2014.   Pulmonary nodules      Past Surgical History: Past Surgical History:  Procedure Laterality Date   BILIARY STENT PLACEMENT  03/09/2024   Procedure: INSERTION, STENT, BILE DUCT;  Surgeon: [name], MD;  Location: [facility] ENDOSCOPY;  Service: Endoscopy;;   CHOLECYSTECTOMY N/A 03/29/2021   Procedure: LAPAROSCOPIC CHOLECYSTECTOMY;  Surgeon: Lucretia Roers, MD;  Location: [facility] ORS;  Service: General;  Laterality: N/A;   CORONARY STENT INTERVENTION N/A 11/26/2017   Procedure: CORONARY STENT INTERVENTION;  Surgeon: Swaziland, Peter M, MD;  Location: [facility] INVASIVE CV LAB;  Service: Cardiovascular;  Laterality: N/A;   CORONARY STENT PLACEMENT     ERCP N/A 03/28/2021   Procedure: ENDOSCOPIC RETROGRADE CHOLANGIOPANCREATOGRAPHY (ERCP);  Surgeon: Malissa Hippo, MD;  Location: [facility] ORS;  Service: Endoscopy;  Laterality: N/A;   ERCP N/A 03/09/2024   Procedure: ERCP, WITH INTERVENTION IF INDICATED;  Surgeon: [name], MD;  Location: [facility] ENDOSCOPY;  Service: Endoscopy;  Laterality: N/A;   ESOPHAGOGASTRODUODENOSCOPY  03/09/2024   Procedure: EGD (ESOPHAGOGASTRODUODENOSCOPY);  Surgeon: [name], MD;   Location: [facility] ENDOSCOPY;  Service: Endoscopy;;   intrathecal pain pump     LEFT HEART CATH AND CORONARY ANGIOGRAPHY N/A 11/26/2017   Procedure: LEFT HEART CATH AND CORONARY ANGIOGRAPHY;  Surgeon: Swaziland, Peter M, MD;  Location: [facility] INVASIVE CV LAB;  Service: Cardiovascular;  Laterality: N/A;   REMOVAL OF STONES  03/28/2021   Procedure: REMOVAL OF STONES;  Surgeon: Malissa Hippo, MD;  Location: [facility];  Service: Endoscopy;;   SPHINCTEROTOMY  03/28/2021   Procedure: SPHINCTEROTOMY;  Surgeon: Malissa Hippo, MD;  Location: [facility];  Service: Endoscopy;;   UMBILICAL HERNIA REPAIR N/A 04/03/2021   Procedure: ADULT PRIMARY UMBILICAL HERNIA REPAIR;  Surgeon: Lucretia Roers, MD;  Location: [facility];  Service: General;  Laterality: N/A;   VASCULAR SURGERY       Family History: Family History  Problem Relation Age of Onset   CAD Father 37   Heart attack Father    COPD Sister    CAD Paternal Grandmother    Sudden Cardiac Death Neg Hx    Colon polyps Neg Hx    Colon cancer Neg Hx      Social History: Social History   Socioeconomic History   Marital status: Single    Spouse name: Not on file   Number of children: Not on file   Years of education: Not on file   Highest education level: Not on file  Occupational History   Occupation: Retired  Tobacco Use   Smoking status: Former    Current packs/day: 0.00    Average packs/day: 2.0 packs/day for 18.0 years (36.0 ttl pk-yrs)    Types: Cigarettes    Start date: 12/01/1962    Quit date: 12/01/1980    Years since quitting: 43.3   Smokeless tobacco: Never  Vaping Use   Vaping status: Never Used  Substance and Sexual Activity   Alcohol use: No   Drug use: Yes    Types: Oxycodone, Morphine    Comment: takes methadone   Sexual activity: Not Currently  Other Topics Concern   Not on file  Social History Narrative   Not on file   Social Drivers of Health   Financial Resource Strain: High Risk (01/04/2024)   Received from [facility]   Overall Financial Resource Strain (CARDIA)    Difficulty of Paying Living Expenses: Hard  Food Insecurity: No Food Insecurity (03/09/2024)   Hunger Vital Sign    Worried About Running Out of Food in the Last Year: Never true    Ran Out of Food in the Last Year: Never true  Transportation Needs: No Transportation Needs (03/09/2024)   PRAPARE - Administrator, Civil Service (Medical): No    Lack of Transportation (Non-Medical): No  Recent Concern: Transportation Needs - Unmet Transportation Needs (01/04/2024)   Received from [facility] - Transportation    In the past 12 months, has lack of transportation kept you from medical appointments or from getting medications?: Yes    Lack of Transportation (Non-Medical): Yes  Physical Activity: Inactive (08/17/2023)   Received from [facility]   Exercise Vital Sign    Days of Exercise per Week: 0 days    Minutes of Exercise per Session: 0 min  Stress: No Stress Concern Present (08/17/2023)   Received from [facility] - Occupational Stress Questionnaire    Feeling of Stress : Only a little  Social Connections: Socially Isolated (03/09/2024)   Social Connection and Isolation Panel [NHANES]    Frequency of Communication with Friends and Family: More than three times a week    Frequency of Social Gatherings with Friends and Family: More than three times a week    Attends Religious Services: Never    Database administrator or Organizations: No    Attends Banker  Meetings: Never    Marital Status: Widowed  Intimate Partner Violence: Not At Risk (03/09/2024)   Humiliation, Afraid, Rape, and Kick questionnaire    Fear of Current or Ex-Partner: No    Emotionally Abused: No    Physically Abused: No    Sexually Abused: No     Review of Systems: Review of Systems  Constitutional:  Positive for  malaise/fatigue. Negative for chills and fever.  HENT:  Negative for congestion, hearing loss and tinnitus.   Eyes:  Negative for blurred vision and double vision.  Respiratory:  Negative for cough, sputum production and shortness of breath.   Cardiovascular:  Negative for chest pain, palpitations and orthopnea.  Gastrointestinal:  Positive for nausea and vomiting. Negative for diarrhea.  Genitourinary:  Negative for dysuria, frequency and urgency.  Musculoskeletal:  Negative for myalgias.  Skin:  Negative for itching and rash.  Neurological:  Positive for weakness. Negative for dizziness and focal weakness.  Endo/Heme/Allergies:  Negative for polydipsia. Does not bruise/bleed easily.  Psychiatric/Behavioral:  The patient is not nervous/anxious.      Vital Signs: Blood pressure 126/61, pulse 100, temperature 97.9 F (36.6 C), temperature source Oral, resp. rate 18, height 5' (1.524 m), weight 89 kg, SpO2 97%.  Weight trends: Filed Weights   03/09/24 2235 03/10/24 0500 03/11/24 0500  Weight: 84.2 kg 84.2 kg 89 kg     Physical Exam: General: No acute distress  Head: Normocephalic, atraumatic. Moist oral mucosal membranes  Eyes: Icterus noted  Neck: Supple  Lungs:  Clear to auscultation, normal effort  Heart: S1S2 no rubs  Abdomen:  Soft, right upper quadrant tenderness noted, mild distention  Extremities: Trace peripheral edema.  Neurologic: Awake, alert, following commands  Skin: No acute rash  Access: No hemodialysis access    Lab results: Basic Metabolic Panel: Recent Labs  Lab 03/09/24 0556 03/10/24 0408 03/11/24 0454  NA 126* 127* 130*  K 4.8 5.1 5.2*  CL 93* 95* 98  CO2 21* 22 21*  GLUCOSE 159* 123* 114*  BUN 40* 47* 46*  CREATININE 1.99* 2.28* 2.43*  CALCIUM 8.3* 8.0* 7.7*  MG 2.4 2.5*  --   PHOS 3.3  --   --     Liver Function Tests: Recent Labs  Lab 03/09/24 0556 03/10/24 0408 03/11/24 0454  AST 509* 499* 576*  ALT 160* 154* 176*  ALKPHOS  1,466* 1,211* 1,045*  BILITOT 11.4* 10.2* 7.0*  PROT 6.6 5.9* 6.2*  ALBUMIN 1.7* 1.7* 1.8*   Recent Labs  Lab 03/08/24 1558  LIPASE 25   Recent Labs  Lab 03/08/24 1558  AMMONIA 23    CBC: Recent Labs  Lab 03/08/24 1558 03/09/24 0556 03/10/24 0408 03/11/24 0454  WBC 17.8* 15.1* 12.1* 12.0*  NEUTROABS 16.4*  --  11.4*  --   HGB 9.0* 9.0* 8.1* 8.0*  HCT 27.5* 28.2* 23.2* 23.9*  MCV 95.5 96.9 90.6 91.9  PLT 269 244 263 264    Cardiac Enzymes: No results for input(s): "CKTOTAL", "CKMB", "CKMBINDEX", "TROPONINI" in the last 168 hours.  BNP: Invalid input(s): "POCBNP"  CBG: Recent Labs  Lab 03/08/24 1625 03/08/24 1726 03/08/24 1938 03/09/24 0116 03/10/24 0820  GLUCAP 46* 42* 136* 174* 100*    Microbiology: Results for orders placed or performed during the hospital encounter of 03/08/24  Urine Culture     Status: Abnormal   Collection Time: 03/08/24  7:43 PM   Specimen: Urine, Clean Catch  Result Value Ref Range Status   Specimen Description  Final    URINE, CLEAN CATCH Performed at [facility], [address], [place], [place] [address]    Special Requests   Final    NONE Performed at [facility], [address] [place], [place] [address]    Culture >=100,000 COLONIES/mL ESCHERICHIA COLI (A)  Final   Report Status 03/11/2024 FINAL  Final   Organism ID, Bacteria ESCHERICHIA COLI (A)  Final      Susceptibility   Escherichia coli - MIC*    AMPICILLIN <=2 SENSITIVE Sensitive     CEFAZOLIN <=4 SENSITIVE Sensitive     CEFEPIME <=0.12 SENSITIVE Sensitive     CEFTRIAXONE <=0.25 SENSITIVE Sensitive     CIPROFLOXACIN <=0.25 SENSITIVE Sensitive     GENTAMICIN <=1 SENSITIVE Sensitive     IMIPENEM <=0.25 SENSITIVE Sensitive     NITROFURANTOIN <=16 SENSITIVE Sensitive     TRIMETH/SULFA <=20 SENSITIVE Sensitive     AMPICILLIN/SULBACTAM <=2 SENSITIVE Sensitive     PIP/TAZO <=4 SENSITIVE Sensitive ug/mL    * >=100,000 COLONIES/mL ESCHERICHIA COLI  Blood  culture (routine x 2)     Status: None (Preliminary result)   Collection Time: 03/08/24 11:53 PM   Specimen: BLOOD  Result Value Ref Range Status   Specimen Description BLOOD BLOOD LEFT HAND  Final   Special Requests   Final    BOTTLES DRAWN AEROBIC AND ANAEROBIC Blood Culture adequate volume   Culture   Final    NO GROWTH 3 DAYS Performed at [facility], [address], [place], [place] [address]    Report Status PENDING  Incomplete  Blood culture (routine x 2)     Status: None (Preliminary result)   Collection Time: 03/08/24 11:55 PM   Specimen: BLOOD  Result Value Ref Range Status   Specimen Description BLOOD BLOOD LEFT HAND  Final   Special Requests   Final    BOTTLES DRAWN AEROBIC ONLY Blood Culture adequate volume   Culture   Final    NO GROWTH 3 DAYS Performed at [facility], [address], [place], [place] [address]    Report Status PENDING  Incomplete    Coagulation Studies: No results for input(s): "LABPROT", "INR" in the last 72 hours.  Urinalysis: No results for input(s): "COLORURINE", "LABSPEC", "PHURINE", "GLUCOSEU", "HGBUR", "BILIRUBINUR", "KETONESUR", "PROTEINUR", "UROBILINOGEN", "NITRITE", "LEUKOCYTESUR" in the last 72 hours.  Invalid input(s): "APPERANCEUR"    Imaging: US RENAL Result Date: 03/11/2024 CLINICAL DATA:  Acute kidney injury EXAM: RENAL / URINARY TRACT ULTRASOUND COMPLETE COMPARISON:  CT abdomen pelvis 03/08/2024 FINDINGS: Right Kidney: Renal measurements: 9.5 x 4.7 x 5.3 cm = volume: 124 mL. Mild increased echogenicity the renal cortex without significant thinning. No mass or hydronephrosis visualized. Left Kidney: Renal measurements: 9.9 x 5.2 x 4.5 cm = volume: 121 mL. Mild increased echogenicity of the renal cortex without significant thinning. Solid heterogeneous mass again seen in the upper pole measuring 3.1 x 2.2 x 2.7 cm. A simple cyst seen in the lower pole of the left kidney measuring 1.3 x 1.1 x 1.2 cm not require dedicated imaging  follow-up. Bladder: Appears normal for degree of bladder distention. Other: None. IMPRESSION: 1. No hydronephrosis. 2. Increased echogenicity of the renal cortex consistent with chronic medical renal disease. 3. Solid heterogeneous mass in the upper pole the left kidney again seen, consistent with the renal cell carcinoma. Electronically Signed   By: Acquanetta Belling M.D.   On: 03/11/2024 12:23     Assessment & Plan: Pt is a 78 y.o. female with  a PMHx of hypertension, hyperlipidemia, coronary artery disease s/p DES x 2, staage IV metastatic breast cancer to the bone, chronic pain syndrome, left renal cell carcinoma, who was admitted to [facility] on 03/09/2024 for evaluation of acute cholangitis, obstructive jaundice, and sepsis.  1.  Acute kidney injury secondary to ATN from sepsis and dehydration.  Creatinine rising and currently 2.43.  Renal ultrasound negative for hydronephrosis.  No immediate need for dialysis but we did advise the patient and the family that this could be a potential possibility if renal function continues to deteriorate.  Continue supportive care for now and avoid nephrotoxins as possible.  Maintain the patient on hydration with 0.9 normal saline 75 cc/h.  2.  Left upper pole renal mass consistent with renal cell carcinoma.  This has been a known entity.  Not an immediate issue but could be addressed further by urology as outpatient.  3.  Hyperkalemia.  Patient with mild hyperkalemia with serum potassium of 5.2.  Continue to monitor for now and use potassium binders as indicated.  4.  Hyponatremia.  Likely due to volume depletion.  Serum sodium up to 130.  Continue 0.9 normal saline.  5.  Overall prognosis guarded.

## 2024-03-11 NOTE — Progress Notes (Signed)
 [name], MD [facility]   [address], [address] [place], [place] [address] Phone: [phone] Fax : [phone]   Subjective: This patient is status post ERCP with stent placement for cholangitis.  The patient has been lethargic over the last few days but this morning is awake and speaking in full sentences.  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  The patient's bilirubin has come down to 7.0 from a high of 11.4 and the alkaline phosphatase has decreased.  The patient's AST and ALT have remained high with very little movement.  The patient's white cell count has come from 17.8 to 12.0 over the last 3 days.  The hemoglobin is stable from yesterday but did trend down a gram since admission.   Objective: Vital signs in last 24 hours: Vitals:   03/11/24 0000 03/11/24 0425 03/11/24 0500 03/11/24 0745  BP: (!) 117/54  (!) 104/48   Pulse: 86  82 80  Resp: 13  12 15   Temp:  (!) 97.3 F (36.3 C)  97.7 F (36.5 C)  TempSrc:  Oral  Oral  SpO2: 97%  98% 95%  Weight:   89 kg   Height:       Weight change: 4.8 kg  Intake/Output Summary (Last 24 hours) at 03/11/2024 0849 Last data filed at 03/11/2024 0426 Gross per 24 hour  Intake 1339.35 ml  Output 700 ml  Net 639.35 ml     Exam: Heart:: Regular rate and rhythm, S1S2 present, or without murmur or extra heart sounds Lungs: normal and clear to auscultation and percussion Abdomen: soft, nontender, normal bowel sounds   Lab Results: @LABTEST2 @ Micro Results: Recent Results (from the past 240 hours)  Urine Culture     Status: Abnormal   Collection Time: 03/08/24  7:43 PM   Specimen: Urine, Clean Catch  Result Value Ref Range Status   Specimen Description   Final    URINE, CLEAN CATCH Performed at [facility], [address], [place], [place] [address]    Special Requests   Final    NONE Performed at [facility], [address] [place], [place] [address]    Culture  >=100,000 COLONIES/mL ESCHERICHIA COLI (A)  Final   Report Status 03/11/2024 FINAL  Final   Organism ID, Bacteria ESCHERICHIA COLI (A)  Final      Susceptibility   Escherichia coli - MIC*    AMPICILLIN <=2 SENSITIVE Sensitive     CEFAZOLIN <=4 SENSITIVE Sensitive     CEFEPIME <=0.12 SENSITIVE Sensitive     CEFTRIAXONE <=0.25 SENSITIVE Sensitive     CIPROFLOXACIN <=0.25 SENSITIVE Sensitive     GENTAMICIN <=1 SENSITIVE Sensitive     IMIPENEM <=0.25 SENSITIVE Sensitive     NITROFURANTOIN <=16 SENSITIVE Sensitive     TRIMETH/SULFA <=20 SENSITIVE Sensitive     AMPICILLIN/SULBACTAM <=2 SENSITIVE Sensitive     PIP/TAZO <=4 SENSITIVE Sensitive ug/mL    * >=100,000 COLONIES/mL ESCHERICHIA COLI  Blood culture (routine x 2)     Status: None (Preliminary result)   Collection Time: 03/08/24 11:53 PM   Specimen: BLOOD  Result Value Ref Range Status   Specimen Description BLOOD BLOOD LEFT HAND  Final   Special Requests   Final    BOTTLES DRAWN AEROBIC AND ANAEROBIC Blood Culture adequate volume   Culture   Final    NO GROWTH 2 DAYS Performed at [facility], [address] [place], [place] [address]  Report Status PENDING  Incomplete  Blood culture (routine x 2)     Status: None (Preliminary result)   Collection Time: 03/08/24 11:55 PM   Specimen: BLOOD  Result Value Ref Range Status   Specimen Description BLOOD BLOOD LEFT HAND  Final   Special Requests   Final    BOTTLES DRAWN AEROBIC ONLY Blood Culture adequate volume   Culture   Final    NO GROWTH 2 DAYS Performed at [facility], [address], [place], [place] [address]    Report Status PENDING  Incomplete   Studies/Results: DG C-Arm 1-60 Min-No Report Result Date: 03/09/2024 Fluoroscopy was utilized by the requesting physician.  No radiographic interpretation.   Medications: I have reviewed the patient's current medications. Scheduled Meds:  Fe Fum-Vit C-Vit B12-FA  1 capsule Oral BID   feeding supplement  1 Container Oral  TID BM   pantoprazole (PROTONIX) IV  40 mg Intravenous Q12H   sodium chloride flush  3 mL Intravenous Q12H   Continuous Infusions:  sodium chloride 100 mL/hr at 03/11/24 0226   piperacillin-tazobactam (ZOSYN)  IV     PRN Meds:.acetaminophen **OR** acetaminophen, bisacodyl, HYDROmorphone (DILAUDID) injection, HYDROmorphone, ipratropium-albuterol, ondansetron **OR** ondansetron (ZOFRAN) IV   Assessment: Principal Problem:   Acute cholangitis Active Problems:   Hypertension   Primary malignant neoplasm of breast with metastasis (HCC)   Transaminitis   COPD (chronic obstructive pulmonary disease) (HCC)   CAD in native artery   Pain of metastatic malignancy   Chronic combined systolic and diastolic CHF (congestive heart failure) (HCC)   AKI (acute kidney injury) (HCC)   Obstructive jaundice   Pyuria   Hyponatremia   Duodenal stenosis   Acute on chronic anemia    Plan: This patient is status post ERCP with stent placement for cholangitis with frank purulent material coming from the common bile duct during ERCP.  The patient also had significant duodenal ulcerations and a duodenal stricture and ampullary stenosis.  These were both dilated.  The patient has been told that she will need a repeat ERCP with stent removal and stone extraction when she is off of her Plavix in the next 2 to 3 months.  Dr. Allegra Lai will be on this weekend if any issues should arise.   LOS: 2 days   [name], MD.FACG 03/11/2024, 8:49 AM Pager [phone] 7am-5pm  Check AMION for 5pm -7am coverage and on weekends

## 2024-03-11 NOTE — Care Management Important Message (Signed)
 Important Message  Patient Details  Name: Stacey [name] MRN: [id number] Date of Birth: 03-Mar-1946   Important Message Given:  Yes - Medicare IM     Marcell Anger 03/11/2024, 3:24 PM

## 2024-03-11 NOTE — Assessment & Plan Note (Signed)
 Sodium at 130 today.  Hyponatremia labs with low urine osmolality, urine sodium of 11 and normal serum osmolality. -Monitor sodium

## 2024-03-11 NOTE — Progress Notes (Signed)
 Pharmacy Antibiotic Note  Stacey [name] is a 78 y.o. female admitted on 03/09/2024 with  intra-abdominal infection .  Pharmacy has been consulted for Zosyn dosing.  Plan: Day 3 of abx. Adjusted pip/tazo 3.375 g q8H to q12H due to CRCl < 20.    Height: 5' (152.4 cm) Weight: 89 kg (196 lb 3.4 oz) IBW/kg (Calculated) : 45.5  Temp (24hrs), Avg:97.7 F (36.5 C), Min:97.3 F (36.3 C), Max:97.9 F (36.6 C)  Recent Labs  Lab 03/08/24 1558 03/09/24 0556 03/10/24 0408 03/11/24 0454  WBC 17.8* 15.1* 12.1* 12.0*  CREATININE 1.91* 1.99* 2.28* 2.43*    Estimated Creatinine Clearance: 19.3 mL/min (A) (by C-G formula based on SCr of 2.43 mg/dL (H)).    Allergies  Allergen Reactions   Brilinta [Ticagrelor] Diarrhea and Nausea Only    Nausea and severe diarrhea- general weakness. Patient says does not want to take again   Budesonide Palpitations   Albuterol Other (See Comments)    Heart racing     Antimicrobials this admission: 4/9 Zosyn >>   Microbiology results: 4/8 BCx: NG<12h 4/8 UCx: >=100,000 COLONIES/mL ESCHERICHIA COLI    Thank you for allowing pharmacy to be a part of this patient's care.  Ronnald Ramp, PharmD Clinical Pharmacist  03/11/2024 8:02 AM

## 2024-03-11 NOTE — Plan of Care (Signed)

## 2024-03-12 DIAGNOSIS — N390 Urinary tract infection, site not specified: Secondary | ICD-10-CM

## 2024-03-12 DIAGNOSIS — B9681 Helicobacter pylori [H. pylori] as the cause of diseases classified elsewhere: Secondary | ICD-10-CM

## 2024-03-12 DIAGNOSIS — K269 Duodenal ulcer, unspecified as acute or chronic, without hemorrhage or perforation: Secondary | ICD-10-CM | POA: Diagnosis not present

## 2024-03-12 DIAGNOSIS — K8309 Other cholangitis: Secondary | ICD-10-CM | POA: Diagnosis not present

## 2024-03-12 DIAGNOSIS — K831 Obstruction of bile duct: Secondary | ICD-10-CM | POA: Diagnosis not present

## 2024-03-12 DIAGNOSIS — N179 Acute kidney failure, unspecified: Secondary | ICD-10-CM | POA: Diagnosis not present

## 2024-03-12 LAB — COMPREHENSIVE METABOLIC PANEL WITH GFR
ALT: 172 U/L — ABNORMAL HIGH (ref 0–44)
AST: 473 U/L — ABNORMAL HIGH (ref 15–41)
Albumin: 1.7 g/dL — ABNORMAL LOW (ref 3.5–5.0)
Alkaline Phosphatase: 891 U/L — ABNORMAL HIGH (ref 38–126)
Anion gap: 12 (ref 5–15)
BUN: 42 mg/dL — ABNORMAL HIGH (ref 8–23)
CO2: 17 mmol/L — ABNORMAL LOW (ref 22–32)
Calcium: 7.2 mg/dL — ABNORMAL LOW (ref 8.9–10.3)
Chloride: 106 mmol/L (ref 98–111)
Creatinine, Ser: 2.42 mg/dL — ABNORMAL HIGH (ref 0.44–1.00)
GFR, Estimated: 20 mL/min — ABNORMAL LOW (ref >60)
Glucose, Bld: 80 mg/dL (ref 70–99)
Potassium: 5.1 mmol/L (ref 3.5–5.1)
Sodium: 135 mmol/L (ref 135–145)
Total Bilirubin: 5.9 mg/dL — ABNORMAL HIGH (ref 0.0–1.2)
Total Protein: 5.8 g/dL — ABNORMAL LOW (ref 6.5–8.1)

## 2024-03-12 LAB — CBC
HCT: 25.4 % — ABNORMAL LOW (ref 36.0–46.0)
Hemoglobin: 8.3 g/dL — ABNORMAL LOW (ref 12.0–15.0)
MCH: 31.4 pg (ref 26.0–34.0)
MCHC: 32.7 g/dL (ref 30.0–36.0)
MCV: 96.2 fL (ref 80.0–100.0)
Platelets: 223 10*3/uL (ref 150–400)
RBC: 2.64 MIL/uL — ABNORMAL LOW (ref 3.87–5.11)
RDW: 17.3 % — ABNORMAL HIGH (ref 11.5–15.5)
WBC: 9.4 10*3/uL (ref 4.0–10.5)
nRBC: 0 % (ref 0.0–0.2)

## 2024-03-12 LAB — GLUCOSE, CAPILLARY: Glucose-Capillary: 80 mg/dL (ref 70–99)

## 2024-03-12 MED ORDER — SODIUM BICARBONATE 650 MG PO TABS
1300.0000 mg | ORAL_TABLET | Freq: Two times a day (BID) | ORAL | Status: DC
  Administered 2024-03-12 – 2024-03-18 (×12): 1300 mg via ORAL
  Filled 2024-03-12 (×12): qty 2

## 2024-03-12 MED ORDER — ALBUMIN HUMAN 25 % IV SOLN
12.5000 g | Freq: Two times a day (BID) | INTRAVENOUS | Status: AC
  Administered 2024-03-12 – 2024-03-14 (×6): 12.5 g via INTRAVENOUS
  Filled 2024-03-12 (×6): qty 50

## 2024-03-12 NOTE — Plan of Care (Signed)
   Problem: Education: Goal: Knowledge of General Education information will improve Description: Including pain rating scale, medication(s)/side effects and non-pharmacologic comfort measures Outcome: Progressing   Problem: Clinical Measurements: Goal: Respiratory complications will improve Outcome: Progressing   Problem: Nutrition: Goal: Adequate nutrition will be maintained Outcome: Progressing

## 2024-03-12 NOTE — Plan of Care (Signed)

## 2024-03-12 NOTE — Progress Notes (Signed)
 Stacey Oz, MD [address]  [address]  [place], [place] [address]  Main: [phone]  Fax: [phone] Pager: [phone]   Subjective: No acute events overnight.  Patient denies any abdominal pain, nausea or vomiting.  She is tolerating liquids well.   Objective: Vital signs in last 24 hours: Vitals:   03/12/24 0432 03/12/24 0500 03/12/24 0805 03/12/24 1206  BP:   101/60 (!) 134/93  Pulse:   (!) 110 (!) 58  Resp:   16 16  Temp: (!) 97.4 F (36.3 C)  98.3 F (36.8 C) 97.8 F (36.6 C)  TempSrc:   Oral   SpO2:    97%  Weight:  91.1 kg    Height:       Weight change: 2.1 kg  Intake/Output Summary (Last 24 hours) at 03/12/2024 1743 Last data filed at 03/12/2024 1341 Gross per 24 hour  Intake 1195.33 ml  Output 900 ml  Net 295.33 ml     Exam: Heart: Regular rate and rhythm, S1S2 present, or without murmur or extra heart sounds Lungs: normal and clear to auscultation Abdomen: soft, nontender, normal bowel sounds   Lab Results:    Latest Ref Rng & Units 03/12/2024    3:49 AM 03/11/2024    4:54 AM 03/10/2024    4:08 AM  CBC  WBC 4.0 - 10.5 K/uL 9.4  12.0  12.1   Hemoglobin 12.0 - 15.0 g/dL 8.3  8.0  8.1   Hematocrit 36.0 - 46.0 % 25.4  23.9  23.2   Platelets 150 - 400 K/uL 223  264  263       Latest Ref Rng & Units 03/12/2024    3:49 AM 03/11/2024    4:54 AM 03/10/2024    4:08 AM  CMP  Glucose 70 - 99 mg/dL 80  151  761   BUN 8 - 23 mg/dL 42  46  47   Creatinine 0.44 - 1.00 mg/dL 6.07  3.71  0.62   Sodium 135 - 145 mmol/L 135  130  127   Potassium 3.5 - 5.1 mmol/L 5.1  5.2  5.1   Chloride 98 - 111 mmol/L 106  98  95   CO2 22 - 32 mmol/L 17  21  22    Calcium 8.9 - 10.3 mg/dL 7.2  7.7  8.0   Total Protein 6.5 - 8.1 g/dL 5.8  6.2  5.9   Total Bilirubin 0.0 - 1.2 mg/dL 5.9  7.0  69.4   Alkaline Phos 38 - 126 U/L 891  1,045  1,211   AST 15 - 41 U/L 473  576  499   ALT 0 - 44 U/L 172  176  154     Micro Results: Recent Results (from the past  240 hours)  Urine Culture     Status: Abnormal   Collection Time: 03/08/24  7:43 PM   Specimen: Urine, Clean Catch  Result Value Ref Range Status   Specimen Description   Final    URINE, CLEAN CATCH Performed at [facility], [address], [place], [place] [address]    Special Requests   Final    NONE Performed at [facility], [address], [place], [place] [address]    Culture >=100,000 COLONIES/mL ESCHERICHIA COLI (A)  Final   Report Status 03/11/2024 FINAL  Final   Organism ID, Bacteria ESCHERICHIA COLI (A)  Final      Susceptibility   Escherichia coli - MIC*    AMPICILLIN <=  2 SENSITIVE Sensitive     CEFAZOLIN <=4 SENSITIVE Sensitive     CEFEPIME <=0.12 SENSITIVE Sensitive     CEFTRIAXONE <=0.25 SENSITIVE Sensitive     CIPROFLOXACIN <=0.25 SENSITIVE Sensitive     GENTAMICIN <=1 SENSITIVE Sensitive     IMIPENEM <=0.25 SENSITIVE Sensitive     NITROFURANTOIN <=16 SENSITIVE Sensitive     TRIMETH/SULFA <=20 SENSITIVE Sensitive     AMPICILLIN/SULBACTAM <=2 SENSITIVE Sensitive     PIP/TAZO <=4 SENSITIVE Sensitive ug/mL    * >=100,000 COLONIES/mL ESCHERICHIA COLI  Blood culture (routine x 2)     Status: None (Preliminary result)   Collection Time: 03/08/24 11:53 PM   Specimen: BLOOD  Result Value Ref Range Status   Specimen Description BLOOD BLOOD LEFT HAND  Final   Special Requests   Final    BOTTLES DRAWN AEROBIC AND ANAEROBIC Blood Culture adequate volume   Culture   Final    NO GROWTH 4 DAYS Performed at [facility], [address], [place], [place] [address]    Report Status PENDING  Incomplete  Blood culture (routine x 2)     Status: None (Preliminary result)   Collection Time: 03/08/24 11:55 PM   Specimen: BLOOD  Result Value Ref Range Status   Specimen Description BLOOD BLOOD LEFT HAND  Final   Special Requests   Final    BOTTLES DRAWN AEROBIC ONLY Blood Culture adequate volume   Culture   Final    NO GROWTH 4 DAYS Performed at [facility], [address], [place], [place] [address]    Report Status PENDING  Incomplete   Studies/Results: US  RENAL Result Date: 03/11/2024 CLINICAL DATA:  Acute kidney injury EXAM: RENAL / URINARY TRACT ULTRASOUND COMPLETE COMPARISON:  CT abdomen pelvis 03/08/2024 FINDINGS: Right Kidney: Renal measurements: 9.5 x 4.7 x 5.3 cm = volume: 124 mL. Mild increased echogenicity the renal cortex without significant thinning. No mass or hydronephrosis visualized. Left Kidney: Renal measurements: 9.9 x 5.2 x 4.5 cm = volume: 121 mL. Mild increased echogenicity of the renal cortex without significant thinning. Solid heterogeneous mass again seen in the upper pole measuring 3.1 x 2.2 x 2.7 cm. A simple cyst seen in the lower pole of the left kidney measuring 1.3 x 1.1 x 1.2 cm not require dedicated imaging follow-up. Bladder: Appears normal for degree of bladder distention. Other: None. IMPRESSION: 1. No hydronephrosis. 2. Increased echogenicity of the renal cortex consistent with chronic medical renal disease. 3. Solid heterogeneous mass in the upper pole the left kidney again seen, consistent with the renal cell carcinoma. Electronically Signed   By: Elester Grim M.D.   On: 03/11/2024 12:23   Medications: I have reviewed the patient's current medications. Prior to Admission:  Medications Prior to Admission  Medication Sig Dispense Refill Last Dose/Taking   acetaminophen (TYLENOL) 325 MG tablet Take 975 mg by mouth every 6 (six) hours as needed for mild pain (pain score 1-3).   Past Week   amLODipine (NORVASC) 5 MG tablet Take 5 mg by mouth daily.   Past Week   apixaban (ELIQUIS) 5 MG TABS tablet Take 5 mg by mouth 2 (two) times daily.   Past Month   atorvastatin (LIPITOR) 80 MG tablet Take 80 mg by mouth daily.   Past Week   b complex vitamins capsule Take 1 capsule by mouth daily.   Past Week   carboxymethylcellulose (REFRESH PLUS) 0.5 % SOLN Place 1 drop into both eyes 3 (three) times daily as needed (  Dry Eyes).   Taking As Needed    clopidogrel (PLAVIX) 75 MG tablet Take 75 mg by mouth daily.   Past Week   diclofenac Sodium (VOLTAREN) 1 % GEL Apply topically.   Past Week   DULoxetine (CYMBALTA) 30 MG capsule Take 30 mg by mouth daily.   Past Week   ferrous sulfate 325 (65 FE) MG EC tablet Take 325 mg by mouth 3 (three) times daily with meals.   Past Week   HYDROmorphone (DILAUDID) 2 MG tablet Take 4 mg by mouth every 4 (four) hours as needed for severe pain (pain score 7-10).   Past Week   losartan (COZAAR) 100 MG tablet Take 100 mg by mouth daily.   Past Week   methocarbamol (ROBAXIN) 500 MG tablet Take 500 mg by mouth every 6 (six) hours as needed for muscle spasms.   Taking As Needed   metoprolol succinate (TOPROL-XL) 100 MG 24 hr tablet Take 150 mg by mouth daily. Take with or immediately following a meal.   Past Week   nitroGLYCERIN (NITROSTAT) 0.4 MG SL tablet Place 0.4 mg under the tongue every 5 (five) minutes as needed for chest pain.   Taking As Needed   HYDROmorphone (DILAUDID) 1 MG/ML injection Inject 0.5 mLs (0.5 mg total) into the vein every 2 (two) hours as needed for severe pain (pain score 7-10) or moderate pain (pain score 4-6).      lactated ringers infusion Inject 80 mLs into the vein continuous.      ondansetron (ZOFRAN) 4 MG/2ML SOLN injection Inject 2 mLs (4 mg total) into the vein every 6 (six) hours as needed for nausea or vomiting.      piperacillin-tazobactam (ZOSYN) 3.375 GM/50ML IVPB Inject 50 mLs (3.375 g total) into the vein every 8 (eight) hours.      Scheduled:  Fe Fum-Vit C-Vit B12-FA  1 capsule Oral BID   feeding supplement  1 Container Oral TID BM   pantoprazole (PROTONIX) IV  40 mg Intravenous Q12H   sodium bicarbonate  1,300 mg Oral BID   sodium chloride flush  3 mL Intravenous Q12H   sodium zirconium cyclosilicate  10 g Oral Daily   Continuous:  albumin human Stopped (03/12/24 1339)   piperacillin-tazobactam (ZOSYN)  IV 12.5 mL/hr at 03/12/24 1341   ZOX:[id number] **OR**  acetaminophen, bisacodyl, HYDROmorphone (DILAUDID) injection, HYDROmorphone, ipratropium-albuterol, ondansetron **OR** ondansetron (ZOFRAN) IV Anti-infectives (From admission, onward)    Start     Dose/Rate Route Frequency Ordered Stop   03/11/24 2200  piperacillin-tazobactam (ZOSYN) IVPB 3.375 g        3.375 g 12.5 mL/hr over 240 Minutes Intravenous Every 12 hours 03/11/24 0802     03/09/24 2000  piperacillin-tazobactam (ZOSYN) IVPB 3.375 g  Status:  Discontinued        3.375 g 12.5 mL/hr over 240 Minutes Intravenous Every 8 hours 03/09/24 1702 03/11/24 0802      Scheduled Meds:  Fe Fum-Vit C-Vit B12-FA  1 capsule Oral BID   feeding supplement  1 Container Oral TID BM   pantoprazole (PROTONIX) IV  40 mg Intravenous Q12H   sodium bicarbonate  1,300 mg Oral BID   sodium chloride flush  3 mL Intravenous Q12H   sodium zirconium cyclosilicate  10 g Oral Daily   Continuous Infusions:  albumin human Stopped (03/12/24 1339)   piperacillin-tazobactam (ZOSYN)  IV 12.5 mL/hr at 03/12/24 1341   PRN Meds:.acetaminophen **OR** acetaminophen, bisacodyl, HYDROmorphone (DILAUDID) injection, HYDROmorphone, ipratropium-albuterol, ondansetron **OR** ondansetron (ZOFRAN) IV  Assessment: Principal Problem:   Acute cholangitis Active Problems:   Hypertension   Primary malignant neoplasm of breast with metastasis (HCC)   Transaminitis   COPD (chronic obstructive pulmonary disease) (HCC)   CAD in native artery   Pain of metastatic malignancy   Chronic combined systolic and diastolic CHF (congestive heart failure) (HCC)   AKI (acute kidney injury) (HCC)   Obstructive jaundice   Pyuria   Hyponatremia   Duodenal stenosis   Acute on chronic anemia   Hyperkalemia  78 year old female with past history of hypertension, hyperlipidemia,, coronary artery disease s/p stent, s/p cholecystectomy, stage IV metastatic breast cancer, left renal cell carcinoma is admitted with acute ascending cholangitis  status post ERCP on 4/9 revealed duodenal ulcer, stricture and ampullary stenosis status post dilation and plastic stent in CBD  Plan: Acute ascending cholangitis status post ERCP, etiology ampullary stricture status post dilation and plastic biliary stent placement on Plavix.  Therefore, sphincterotomy was not performed Blood cultures negative to date, leukocytosis has resolved Continue Zosyn and switch to ciprofloxacin upon discharge for total 2 weeks course LFTs are slowly trending down Patient is hemodynamically stable, tolerating clear liquids well Monitor LFTs daily and advance diet as tolerated as LFTs continue to improve Patient is also currently being treated for E. coli UTI and AKI Repeat ERCP with sphincterotomy as outpatient in 2 to 3 months off Plavix   LOS: 3 days   Amoree Newlon 03/12/2024, 5:43 PM

## 2024-03-12 NOTE — Progress Notes (Signed)
 Progress Note   Patient: Stacey [name]  RITU GAGLIARDO ZOX:[id number] DOB: [birth year] DOA: 03/09/2024     3 DOS: the patient was seen and examined on 03/12/2024   Brief hospital course: Stacey  B [name] is a 78 y.o. 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    Patient was later transferred to [facility] for ERCP.   On presentation patient was quite lethargic and somnolent with stable vital, Initial workup notable for WBC of 17.8, hemoglobin of 9.0, sodium 128, BUN 38, creat 1.91, alkaline phosphatase 1489, AST 550, ALT 171, GFR 27. Urinalysis with large hematuria, leukocytes, many bacteria. CT of the head with no intracranial abnormality. CT chest/abdomen/pelvis with no acute abnormality. MRCP with severe intra and extrahepatic biliary ductal dilation with sharp narrowing common bile duct in the pancreatic head. Patient was started on Zosyn, IV fluids.    Patient was taken for ERCP by GI which shows duodenal ulcer, stricture and ampulla stenosis.  A stent was placed with drainage of purulent discharge.  GI is recommending a repeat ERCP in 2 to 3 months for stent removal and a possible sphincterotomy off the Plavix.   4/10: Vital stable, labs with improving leukocytosis, hemoglobin decreased to 8.1, CMP with sodium of 127, chloride 95, blood glucose 123, worsening renal function with BUN of 47, creatinine 2.28, small improvement in transaminitis with AST 499, ALT 154, alkaline phosphatase 1211 and T. bili of 10.2.  Preliminary blood cultures negative.  Urine cultures pending. Ordered more IV fluid with NS.  Hyponatremia labs were also ordered Patient is wheelchair-bound at baseline, son was  asking about going to rehab.  PT and OT evaluation ordered.   4/11: Hemodynamically stable with worsening renal function and hyperkalemia today.  Slight worsening of transaminitis with improvement of alkaline phosphatase and T. bili.  Renal ultrasound with no hydronephrosis, consistent with medical renal disease and renal cell carcinoma.  Nephrology was also consulted.    4/12: No significant change in renal function.  Persistent transaminitis but shows a downward trend.  Leukocytosis has normalized    Assessment and Plan:   * Acute cholangitis # Obstructive Jaundice  # Transaminitis Patient presented with markedly elevated LFTs with evidence of CBD obstruction in the pancreatic head, unclear whether stone versus mass.   She does have a history of metastatic breast cancer, however no known hepatic mets. S/p ERCP by GI, found to have duodenal ulcer, duodenal and sphincter stenosis.  Stent was placed and the purulent discharge was removed. -Slight worsening of AST and ALT, and small improvement in alkaline phosphatase and T. bili -Patient need a repeat ERCP in 2 to 3 months for stent removal -Continue with Zosyn -Monitor liver function    AKI (acute kidney injury) (HCC) Worsening creatinine at 2.43 noted, baseline seems to be less than 1.   Renal ultrasound with no hydronephrosis, consistent with medical renal disease and prior diagnosis of renal cell carcinoma No significant change with IV fluid hydration. Appreciate nephrology input, recommends IV albumin, 12.5 g twice daily for 3 days Avoid nephrotoxins Monitor renal function closely   Hyperkalemia Likely due to worsening renal function Continue Lokelma Improved.    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 She follows with [facility] pain clinic.      Chronic combined systolic and diastolic CHF (congestive heart failure) (HCC) Patient with history of recovered EF now, last echocardiogram done in February 2024 with normal EF, it was as low as 30 to 35% in 2019 due to ischemic cardiomyopathy.  Clinically appears euvolemic -Monitor volume status closely -Daily weight -Strict intake and output   CAD in native artery S/p DES to ramus and RCA. No chest pain -Will restart Plavix once cleared by GI   COPD (chronic obstructive pulmonary disease) (HCC) No acute exacerbation. - Continue home bronchodilators    UTI UA concerning for UTI, urine culture yields E coli Patient is already on Zosyn    Primary malignant neoplasm of breast with metastasis (HCC) - On palliative fulvestrant  - Outpatient follow-up   Hypertension Blood pressure currently within goal. -Initially holding antihypertensives due to risk of becoming hypotensive.-We will restart as needed   Acute on chronic anemia Hemoglobin is stable at 8.3 Monitor closely          Subjective: Patient is seen and examined at the bedside.  Sleeping but arouses easily.  Physical Exam: Vitals:   03/12/24 0432 03/12/24 0500 03/12/24 0805 03/12/24 1206  BP:   101/60 (!) 134/93  Pulse:   (!) 110 (!) 58  Resp:   16 16  Temp: (!) 97.4 F (36.3 C)  98.3 F (36.8 C) 97.8 F (36.6 C)  TempSrc:   Oral   SpO2:    97%  Weight:  91.1 kg    Height:       General.  Obese elderly lady, in no acute distress.  Improving skin pallor and scleral icterus but still noticeable, sleeping but arouses easily Pulmonary.  Lungs clear bilaterally, normal respiratory effort. CV.  Regular rate and rhythm, no JVD, rub or murmur. Abdomen.  Soft, nontender, nondistended, BS positive.  Central adiposity CNS.  Sleeping but arouses easily Extremities.  Trace LE edema, no cyanosis, pulses intact and symmetrical.    Data Reviewed:  BUN 42, creatinine 2.42, calcium 7.2 but corrects for low albumin  of 1.8, AST 473, ALT 172, alk phos 891 Labs reviewed  Family Communication: Plan of care discussed with nephrology  Disposition: Status is: Inpatient Remains inpatient appropriate because: On IV antibiotics for cholangitis and UTI  Planned Discharge Destination:  TBD    Time spent: 40 minutes  Author: Read Camel, MD 03/12/2024 12:19 PM  For on call review [email or web address].

## 2024-03-12 NOTE — Progress Notes (Signed)
 [facility] [place], [place] 03/12/24  Subjective:   Hospital day # 3 Multiple family members in the room.  Denies any acute complaints. Serum creatinine about the same as yesterday. Renal: 04/11 0701 - 04/12 0700 In: -  Out: 900 [Urine:900] Lab Results  Component Value Date   CREATININE 2.42 (H) 03/12/2024   CREATININE 2.43 (H) 03/11/2024   CREATININE 2.28 (H) 03/10/2024     Objective:  Vital signs in last 24 hours:  Temp:  [97.2 F (36.2 C)-98.3 F (36.8 C)] 97.8 F (36.6 C) (04/12 1206) Pulse Rate:  [58-110] 58 (04/12 1206) Resp:  [13-21] 16 (04/12 1206) BP: (87-134)/(50-93) 134/93 (04/12 1206) SpO2:  [97 %] 97 % (04/12 1206) Weight:  [91.1 kg] 91.1 kg (04/12 0500)  Weight change: 2.1 kg Filed Weights   03/10/24 0500 03/11/24 0500 03/12/24 0500  Weight: 84.2 kg 89 kg 91.1 kg    Intake/Output:    Intake/Output Summary (Last 24 hours) at 03/12/2024 1554 Last data filed at 03/12/2024 1341 Gross per 24 hour  Intake 1195.33 ml  Output 900 ml  Net 295.33 ml     Physical Exam: General: No acute distress, laying in the bed  HEENT Moist oral mucous membranes  Pulm/lungs Normal breathing effort,  Gardens O2  CVS/Heart Tachycardic, irregular  Abdomen:  Soft, nontender, nondistended  Extremities: + dependent edema  Neurologic: Able to follow simple commands  Skin: No acute rashes, jaundice noted          Basic Metabolic Panel:  Recent Labs  Lab 03/08/24 1558 03/09/24 0556 03/10/24 0408 03/11/24 0454 03/12/24 0349  NA 128* 126* 127* 130* 135  K 5.6* 4.8 5.1 5.2* 5.1  CL 94* 93* 95* 98 106  CO2 22 21* 22 21* 17*  GLUCOSE 100* 159* 123* 114* 80  BUN 38* 40* 47* 46* 42*  CREATININE 1.91* 1.99* 2.28* 2.43* 2.42*  CALCIUM 8.8* 8.3* 8.0* 7.7* 7.2*  MG  --  2.4 2.5*  --   --   PHOS  --  3.3  --   --   --      CBC: Recent Labs  Lab 03/08/24 1558 03/09/24 0556 03/10/24 0408 03/11/24 0454 03/12/24 0349  WBC 17.8* 15.1* 12.1* 12.0*  9.4  NEUTROABS 16.4*  --  11.4*  --   --   HGB 9.0* 9.0* 8.1* 8.0* 8.3*  HCT 27.5* 28.2* 23.2* 23.9* 25.4*  MCV 95.5 96.9 90.6 91.9 96.2  PLT 269 244 263 264 223      Lab Results  Component Value Date   HEPBSAG NON REACTIVE 03/08/2024   HEPBIGM NON REACTIVE 03/08/2024      Microbiology:  Recent Results (from the past 240 hours)  Urine Culture     Status: Abnormal   Collection Time: 03/08/24  7:43 PM   Specimen: Urine, Clean Catch  Result Value Ref Range Status   Specimen Description   Final    URINE, CLEAN CATCH Performed at [facility], [address], [place], [place] [address]    Special Requests   Final    NONE Performed at [facility], [address] [place], [place] [address]    Culture >=100,000 COLONIES/mL ESCHERICHIA COLI (A)  Final   Report Status 03/11/2024 FINAL  Final   Organism ID, Bacteria ESCHERICHIA COLI (A)  Final      Susceptibility   Escherichia coli - MIC*    AMPICILLIN <=2 SENSITIVE Sensitive     CEFAZOLIN <=4 SENSITIVE Sensitive     CEFEPIME <=  0.12 SENSITIVE Sensitive     CEFTRIAXONE <=0.25 SENSITIVE Sensitive     CIPROFLOXACIN <=0.25 SENSITIVE Sensitive     GENTAMICIN <=1 SENSITIVE Sensitive     IMIPENEM <=0.25 SENSITIVE Sensitive     NITROFURANTOIN <=16 SENSITIVE Sensitive     TRIMETH/SULFA <=20 SENSITIVE Sensitive     AMPICILLIN/SULBACTAM <=2 SENSITIVE Sensitive     PIP/TAZO <=4 SENSITIVE Sensitive ug/mL    * >=100,000 COLONIES/mL ESCHERICHIA COLI  Blood culture (routine x 2)     Status: None (Preliminary result)   Collection Time: 03/08/24 11:53 PM   Specimen: BLOOD  Result Value Ref Range Status   Specimen Description BLOOD BLOOD LEFT HAND  Final   Special Requests   Final    BOTTLES DRAWN AEROBIC AND ANAEROBIC Blood Culture adequate volume   Culture   Final    NO GROWTH 4 DAYS Performed at [facility], [address] [place], [place] [address]    Report Status PENDING  Incomplete  Blood culture (routine x 2)     Status: None  (Preliminary result)   Collection Time: 03/08/24 11:55 PM   Specimen: BLOOD  Result Value Ref Range Status   Specimen Description BLOOD BLOOD LEFT HAND  Final   Special Requests   Final    BOTTLES DRAWN AEROBIC ONLY Blood Culture adequate volume   Culture   Final    NO GROWTH 4 DAYS Performed at [facility], [address] [place], [place] [address]    Report Status PENDING  Incomplete    Coagulation Studies: No results for input(s): "LABPROT", "INR" in the last 72 hours.  Urinalysis: No results for input(s): "COLORURINE", "LABSPEC", "PHURINE", "GLUCOSEU", "HGBUR", "BILIRUBINUR", "KETONESUR", "PROTEINUR", "UROBILINOGEN", "NITRITE", "LEUKOCYTESUR" in the last 72 hours.  Invalid input(s): "APPERANCEUR"    Imaging: US  RENAL Result Date: 03/11/2024 CLINICAL DATA:  Acute kidney injury EXAM: RENAL / URINARY TRACT ULTRASOUND COMPLETE COMPARISON:  CT abdomen pelvis 03/08/2024 FINDINGS: Right Kidney: Renal measurements: 9.5 x 4.7 x 5.3 cm = volume: 124 mL. Mild increased echogenicity the renal cortex without significant thinning. No mass or hydronephrosis visualized. Left Kidney: Renal measurements: 9.9 x 5.2 x 4.5 cm = volume: 121 mL. Mild increased echogenicity of the renal cortex without significant thinning. Solid heterogeneous mass again seen in the upper pole measuring 3.1 x 2.2 x 2.7 cm. A simple cyst seen in the lower pole of the left kidney measuring 1.3 x 1.1 x 1.2 cm not require dedicated imaging follow-up. Bladder: Appears normal for degree of bladder distention. Other: None. IMPRESSION: 1. No hydronephrosis. 2. Increased echogenicity of the renal cortex consistent with chronic medical renal disease. 3. Solid heterogeneous mass in the upper pole the left kidney again seen, consistent with the renal cell carcinoma. Electronically Signed   By: Elester Grim M.D.   On: 03/11/2024 12:23     Medications:    albumin human Stopped (03/12/24 1339)   piperacillin-tazobactam (ZOSYN)  IV 12.5  mL/hr at 03/12/24 1341    Fe Fum-Vit C-Vit B12-FA  1 capsule Oral BID   feeding supplement  1 Container Oral TID BM   pantoprazole (PROTONIX) IV  40 mg Intravenous Q12H   sodium chloride flush  3 mL Intravenous Q12H   sodium zirconium cyclosilicate  10 g Oral Daily   acetaminophen **OR** acetaminophen, bisacodyl, HYDROmorphone (DILAUDID) injection, HYDROmorphone, ipratropium-albuterol, ondansetron **OR** ondansetron (ZOFRAN) IV  Assessment/ Plan:  78 y.o. 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  Acute kidney injury secondary to ATN from sepsis and dehydration.  Urine cultures growing E. coli. -No further increase in creatinine. - Electrolytes and volume status are acceptable.  No acute indication for dialysis at present. - Agree with supportive care and treatment of UTI as you are doing.  2.  Left upper pole renal mass consistent with renal cell carcinoma - Continue to follow-up with urology as outpatient  3.  Hyperkalemia - Today's potassium level has improved.  Follow low potassium diet.  Consider changing feeding supplement to Nepro instead of boost.  Agree with Lokelma.  4.  Acute metabolic acidosis. Start sodium bicarbonate supplementation.  5.  Generalized edema, hypoalbuminemia Patient would benefit from short-term IV albumin replacement.     LOS: 3 Stacey [name] Stacey [name] 4/12/20253:54 [facility] [place], [place] [phone]  Note: This note was prepared with Dragon dictation. Any transcription errors are unintentional

## 2024-03-13 DIAGNOSIS — N390 Urinary tract infection, site not specified: Secondary | ICD-10-CM | POA: Diagnosis not present

## 2024-03-13 DIAGNOSIS — K269 Duodenal ulcer, unspecified as acute or chronic, without hemorrhage or perforation: Secondary | ICD-10-CM | POA: Diagnosis not present

## 2024-03-13 DIAGNOSIS — N179 Acute kidney failure, unspecified: Secondary | ICD-10-CM | POA: Diagnosis not present

## 2024-03-13 DIAGNOSIS — K831 Obstruction of bile duct: Secondary | ICD-10-CM | POA: Diagnosis not present

## 2024-03-13 DIAGNOSIS — K8309 Other cholangitis: Secondary | ICD-10-CM | POA: Diagnosis not present

## 2024-03-13 LAB — BASIC METABOLIC PANEL WITH GFR
Anion gap: 10 (ref 5–15)
BUN: 40 mg/dL — ABNORMAL HIGH (ref 8–23)
CO2: 20 mmol/L — ABNORMAL LOW (ref 22–32)
Calcium: 7.4 mg/dL — ABNORMAL LOW (ref 8.9–10.3)
Chloride: 106 mmol/L (ref 98–111)
Creatinine, Ser: 2.35 mg/dL — ABNORMAL HIGH (ref 0.44–1.00)
GFR, Estimated: 21 mL/min — ABNORMAL LOW (ref >60)
Glucose, Bld: 102 mg/dL — ABNORMAL HIGH (ref 70–99)
Potassium: 5 mmol/L (ref 3.5–5.1)
Sodium: 136 mmol/L (ref 135–145)

## 2024-03-13 LAB — IRON AND TIBC
Iron: 72 ug/dL (ref 28–170)
Saturation Ratios: 49 % — ABNORMAL HIGH (ref 10.4–31.8)
TIBC: 147 ug/dL — ABNORMAL LOW (ref 250–450)
UIBC: 75 ug/dL

## 2024-03-13 LAB — CULTURE, BLOOD (ROUTINE X 2)
Culture: NO GROWTH
Culture: NO GROWTH
Special Requests: ADEQUATE
Special Requests: ADEQUATE

## 2024-03-13 LAB — CBC
HCT: 22.8 % — ABNORMAL LOW (ref 36.0–46.0)
Hemoglobin: 7.6 g/dL — ABNORMAL LOW (ref 12.0–15.0)
MCH: 31.3 pg (ref 26.0–34.0)
MCHC: 33.3 g/dL (ref 30.0–36.0)
MCV: 93.8 fL (ref 80.0–100.0)
Platelets: 198 10*3/uL (ref 150–400)
RBC: 2.43 MIL/uL — ABNORMAL LOW (ref 3.87–5.11)
RDW: 17.3 % — ABNORMAL HIGH (ref 11.5–15.5)
WBC: 8.6 10*3/uL (ref 4.0–10.5)
nRBC: 0 % (ref 0.0–0.2)

## 2024-03-13 LAB — HEPATIC FUNCTION PANEL
ALT: 157 U/L — ABNORMAL HIGH (ref 0–44)
AST: 417 U/L — ABNORMAL HIGH (ref 15–41)
Albumin: 2.1 g/dL — ABNORMAL LOW (ref 3.5–5.0)
Alkaline Phosphatase: 795 U/L — ABNORMAL HIGH (ref 38–126)
Bilirubin, Direct: 3.4 mg/dL — ABNORMAL HIGH (ref 0.0–0.2)
Indirect Bilirubin: 2.5 mg/dL — ABNORMAL HIGH (ref 0.3–0.9)
Total Bilirubin: 5.9 mg/dL — ABNORMAL HIGH (ref 0.0–1.2)
Total Protein: 5.9 g/dL — ABNORMAL LOW (ref 6.5–8.1)

## 2024-03-13 LAB — FERRITIN: Ferritin: 1285 ng/mL — ABNORMAL HIGH (ref 11–307)

## 2024-03-13 LAB — FOLATE: Folate: >40 ng/mL (ref >5.9)

## 2024-03-13 MED ORDER — ALPRAZOLAM 0.5 MG PO TABS
0.5000 mg | ORAL_TABLET | Freq: Two times a day (BID) | ORAL | Status: DC | PRN
  Administered 2024-03-13 – 2024-03-17 (×8): 0.5 mg via ORAL
  Filled 2024-03-13 (×8): qty 1

## 2024-03-13 NOTE — Progress Notes (Addendum)
 Karma Oz, MD [address]  [address]  [place], [place] [address]  Main: [phone]  Fax: [phone] Pager: [phone]   Subjective: No acute events overnight.  Patient denies any abdominal pain, nausea or vomiting.  She is tolerating liquids well.   Objective: Vital signs in last 24 hours: Vitals:   03/13/24 0420 03/13/24 0500 03/13/24 0800 03/13/24 1527  BP:   125/71 125/71  Pulse:   (!) 109 (!) 124  Resp:   14 20  Temp: 98.4 F (36.9 C)  98.1 F (36.7 C) 98.4 F (36.9 C)  TempSrc: Oral  Oral Oral  SpO2:   97% 100%  Weight:  92.7 kg    Height:       Weight change: 1.6 kg  Intake/Output Summary (Last 24 hours) at 03/13/2024 1902 Last data filed at 03/13/2024 1844 Gross per 24 hour  Intake 361.64 ml  Output 1300 ml  Net -938.36 ml     Exam: Heart: Regular rate and rhythm, S1S2 present, or without murmur or extra heart sounds Lungs: normal and clear to auscultation Abdomen: soft, nontender, normal bowel sounds   Lab Results:    Latest Ref Rng & Units 03/13/2024    9:08 AM 03/12/2024    3:49 AM 03/11/2024    4:54 AM  CBC  WBC 4.0 - 10.5 K/uL 8.6  9.4  12.0   Hemoglobin 12.0 - 15.0 g/dL 7.6  8.3  8.0   Hematocrit 36.0 - 46.0 % 22.8  25.4  23.9   Platelets 150 - 400 K/uL 198  223  264       Latest Ref Rng & Units 03/13/2024    5:55 AM 03/13/2024    5:54 AM 03/12/2024    3:49 AM  CMP  Glucose 70 - 99 mg/dL 962   80   BUN 8 - 23 mg/dL 40   42   Creatinine 9.52 - 1.00 mg/dL 8.41   3.24   Sodium 401 - 145 mmol/L 136   135   Potassium 3.5 - 5.1 mmol/L 5.0   5.1   Chloride 98 - 111 mmol/L 106   106   CO2 22 - 32 mmol/L 20   17   Calcium 8.9 - 10.3 mg/dL 7.4   7.2   Total Protein 6.5 - 8.1 g/dL  5.9  5.8   Total Bilirubin 0.0 - 1.2 mg/dL  5.9  5.9   Alkaline Phos 38 - 126 U/L  795  891   AST 15 - 41 U/L  417  473   ALT 0 - 44 U/L  157  172     Micro Results: Recent Results (from the past 240 hours)  Urine Culture     Status: Abnormal    Collection Time: 03/08/24  7:43 PM   Specimen: Urine, Clean Catch  Result Value Ref Range Status   Specimen Description   Final    URINE, CLEAN CATCH Performed at [facility] [address] [place], [place] [address]    Special Requests   Final    NONE Performed at [facility], [address], [place], [place] [address]    Culture >=100,000 COLONIES/mL ESCHERICHIA COLI (A)  Final   Report Status 03/11/2024 FINAL  Final   Organism ID, Bacteria ESCHERICHIA COLI (A)  Final      Susceptibility   Escherichia coli - MIC*    AMPICILLIN <=2 SENSITIVE Sensitive     CEFAZOLIN <=4 SENSITIVE Sensitive  CEFEPIME <=0.12 SENSITIVE Sensitive     CEFTRIAXONE <=0.25 SENSITIVE Sensitive     CIPROFLOXACIN <=0.25 SENSITIVE Sensitive     GENTAMICIN <=1 SENSITIVE Sensitive     IMIPENEM <=0.25 SENSITIVE Sensitive     NITROFURANTOIN <=16 SENSITIVE Sensitive     TRIMETH/SULFA <=20 SENSITIVE Sensitive     AMPICILLIN/SULBACTAM <=2 SENSITIVE Sensitive     PIP/TAZO <=4 SENSITIVE Sensitive ug/mL    * >=100,000 COLONIES/mL ESCHERICHIA COLI  Blood culture (routine x 2)     Status: None   Collection Time: 03/08/24 11:53 PM   Specimen: BLOOD  Result Value Ref Range Status   Specimen Description BLOOD BLOOD LEFT HAND  Final   Special Requests   Final    BOTTLES DRAWN AEROBIC AND ANAEROBIC Blood Culture adequate volume   Culture   Final    NO GROWTH 5 DAYS Performed at [facility], [address], [place], [place] [address]    Report Status 03/13/2024 FINAL  Final  Blood culture (routine x 2)     Status: None   Collection Time: 03/08/24 11:55 PM   Specimen: BLOOD  Result Value Ref Range Status   Specimen Description BLOOD BLOOD LEFT HAND  Final   Special Requests   Final    BOTTLES DRAWN AEROBIC ONLY Blood Culture adequate volume   Culture   Final    NO GROWTH 5 DAYS Performed at [facility], [address], [place], [place] [address]    Report Status 03/13/2024 FINAL  Final    Studies/Results: No results found.  Medications: I have reviewed the patient's current medications. Prior to Admission:  Medications Prior to Admission  Medication Sig Dispense Refill Last Dose/Taking   acetaminophen (TYLENOL) 325 MG tablet Take 975 mg by mouth every 6 (six) hours as needed for mild pain (pain score 1-3).   Past Week   amLODipine (NORVASC) 5 MG tablet Take 5 mg by mouth daily.   Past Week   apixaban (ELIQUIS) 5 MG TABS tablet Take 5 mg by mouth 2 (two) times daily.   Past Month   atorvastatin (LIPITOR) 80 MG tablet Take 80 mg by mouth daily.   Past Week   b complex vitamins capsule Take 1 capsule by mouth daily.   Past Week   carboxymethylcellulose (REFRESH PLUS) 0.5 % SOLN Place 1 drop into both eyes 3 (three) times daily as needed (Dry Eyes).   Taking As Needed   clopidogrel (PLAVIX) 75 MG tablet Take 75 mg by mouth daily.   Past Week   diclofenac Sodium (VOLTAREN) 1 % GEL Apply topically.   Past Week   DULoxetine (CYMBALTA) 30 MG capsule Take 30 mg by mouth daily.   Past Week   ferrous sulfate 325 (65 FE) MG EC tablet Take 325 mg by mouth 3 (three) times daily with meals.   Past Week   HYDROmorphone (DILAUDID) 2 MG tablet Take 4 mg by mouth every 4 (four) hours as needed for severe pain (pain score 7-10).   Past Week   losartan (COZAAR) 100 MG tablet Take 100 mg by mouth daily.   Past Week   methocarbamol (ROBAXIN) 500 MG tablet Take 500 mg by mouth every 6 (six) hours as needed for muscle spasms.   Taking As Needed   metoprolol succinate (TOPROL-XL) 100 MG 24 hr tablet Take 150 mg by mouth daily. Take with or immediately following a meal.   Past Week   nitroGLYCERIN (NITROSTAT) 0.4 MG SL tablet Place 0.4 mg under the tongue  every 5 (five) minutes as needed for chest pain.   Taking As Needed   HYDROmorphone (DILAUDID) 1 MG/ML injection Inject 0.5 mLs (0.5 mg total) into the vein every 2 (two) hours as needed for severe pain (pain score 7-10) or moderate pain (pain score  4-6).      lactated ringers infusion Inject 80 mLs into the vein continuous.      ondansetron (ZOFRAN) 4 MG/2ML SOLN injection Inject 2 mLs (4 mg total) into the vein every 6 (six) hours as needed for nausea or vomiting.      piperacillin-tazobactam (ZOSYN) 3.375 GM/50ML IVPB Inject 50 mLs (3.375 g total) into the vein every 8 (eight) hours.      Scheduled:  Fe Fum-Vit C-Vit B12-FA  1 capsule Oral BID   feeding supplement  1 Container Oral TID BM   pantoprazole (PROTONIX) IV  40 mg Intravenous Q12H   sodium bicarbonate  1,300 mg Oral BID   sodium chloride flush  3 mL Intravenous Q12H   sodium zirconium cyclosilicate  10 g Oral Daily   Continuous:  albumin human 12.5 g (03/13/24 1012)   piperacillin-tazobactam (ZOSYN)  IV 3.375 g (03/13/24 1009)   JXB:[id number] **OR** acetaminophen, bisacodyl, HYDROmorphone (DILAUDID) injection, HYDROmorphone, ipratropium-albuterol, ondansetron **OR** ondansetron (ZOFRAN) IV Anti-infectives (From admission, onward)    Start     Dose/Rate Route Frequency Ordered Stop   03/11/24 2200  piperacillin-tazobactam (ZOSYN) IVPB 3.375 g        3.375 g 12.5 mL/hr over 240 Minutes Intravenous Every 12 hours 03/11/24 0802     03/09/24 2000  piperacillin-tazobactam (ZOSYN) IVPB 3.375 g  Status:  Discontinued        3.375 g 12.5 mL/hr over 240 Minutes Intravenous Every 8 hours 03/09/24 1702 03/11/24 0802      Scheduled Meds:  Fe Fum-Vit C-Vit B12-FA  1 capsule Oral BID   feeding supplement  1 Container Oral TID BM   pantoprazole (PROTONIX) IV  40 mg Intravenous Q12H   sodium bicarbonate  1,300 mg Oral BID   sodium chloride flush  3 mL Intravenous Q12H   sodium zirconium cyclosilicate  10 g Oral Daily   Continuous Infusions:  albumin human 12.5 g (03/13/24 1012)   piperacillin-tazobactam (ZOSYN)  IV 3.375 g (03/13/24 1009)   PRN Meds:.acetaminophen **OR** acetaminophen, bisacodyl, HYDROmorphone (DILAUDID) injection, HYDROmorphone, ipratropium-albuterol,  ondansetron **OR** ondansetron (ZOFRAN) IV   Assessment: Principal Problem:   Acute cholangitis Active Problems:   Hypertension   Primary malignant neoplasm of breast with metastasis (HCC)   Transaminitis   COPD (chronic obstructive pulmonary disease) (HCC)   CAD in native artery   Pain of metastatic malignancy   Chronic combined systolic and diastolic CHF (congestive heart failure) (HCC)   AKI (acute kidney injury) (HCC)   Obstructive jaundice   Pyuria   Hyponatremia   Duodenal stenosis   Acute on chronic anemia   Hyperkalemia  78 year old female with past history of hypertension, hyperlipidemia,, coronary artery disease s/p stent, s/p cholecystectomy, stage IV metastatic breast cancer, left renal cell carcinoma is admitted with acute ascending cholangitis status post ERCP on 4/9 revealed duodenal ulcer, stricture and ampullary stenosis status post dilation and plastic stent in CBD  Plan: Acute ascending cholangitis status post ERCP, etiology ampullary stricture status post dilation and plastic biliary stent placement on Plavix.  Therefore, sphincterotomy was not performed Blood cultures negative to date, leukocytosis has resolved Continue Zosyn and switch to ciprofloxacin upon discharge for total 2 weeks course LFTs are slowly trending  down Patient is hemodynamically stable, tolerating clear liquids well Monitor LFTs daily and advance diet as tolerated as LFTs continue to improve Patient is also currently being treated for E. coli UTI and AKI Repeat ERCP with sphincterotomy as outpatient in 2 to 3 months off Plavix  Acute on chronic normocytic anemia, was macrocytic in the past No symptoms of active GI bleed Recommend to check iron panel, B12 and folate levels  Dr. Ole Berkeley will cover from tomorrow   LOS: 4 days   Stacey [name] 03/13/2024, 7:02 PM

## 2024-03-13 NOTE — Progress Notes (Signed)
 [facility]  ROUNDING NOTE   Subjective:  Patient in better spirits today, endorses feeling a little better. No complaints. Continues on room air.  Lab Results  Component Value Date   CREATININE 2.35 (H) 03/13/2024   CREATININE 2.42 (H) 03/12/2024   CREATININE 2.43 (H) 03/11/2024    Intake/Output Summary (Last 24 hours) at 03/13/2024 1403 Last data filed at 03/13/2024 0900 Gross per 24 hour  Intake 361.64 ml  Output 1050 ml  Net -688.36 ml     Objective:  Vital signs in last 24 hours:  Temp:  [98.1 F (36.7 C)-98.4 F (36.9 C)] 98.1 F (36.7 C) (04/13 0800) Pulse Rate:  [109] 109 (04/13 0800) Resp:  [14] 14 (04/13 0800) BP: (125-148)/(67-82) 125/71 (04/13 0800) SpO2:  [97 %-99 %] 97 % (04/13 0800) Weight:  [92.7 kg] 92.7 kg (04/13 0500)  Weight change: 1.6 kg Filed Weights   03/11/24 0500 03/12/24 0500 03/13/24 0500  Weight: 89 kg 91.1 kg 92.7 kg    Intake/Output: I/O last 3 completed shifts: In: 1197 [I.V.:1072.1; IV Piggyback:124.9] Out: 1950 [Urine:1950]   Intake/Output this shift:  Total I/O In: 360 [P.O.:360] Out: -   Physical Exam: General: Obese elderly lady, NAD  Head: Normocephalic, atraumatic. Moist oral mucosal membranes  Eyes: Scleral icterus  Neck: Supple, trachea midline  Lungs:  Clear to auscultation  Heart: Regular rate and rhythm  Abdomen:  Soft, nontender, central adiposity  Extremities: +1/+2 LE peripheral edema.  Neurologic: Nonfocal, moving all four extremities  Skin: No lesions, icterus  Access: None    Basic Metabolic Panel: Recent Labs  Lab 03/09/24 0556 03/10/24 0408 03/11/24 0454 03/12/24 0349 03/13/24 0555  NA 126* 127* 130* 135 136  K 4.8 5.1 5.2* 5.1 5.0  CL 93* 95* 98 106 106  CO2 21* 22 21* 17* 20*  GLUCOSE 159* 123* 114* 80 102*  BUN 40* 47* 46* 42* 40*  CREATININE 1.99* 2.28* 2.43* 2.42* 2.35*  CALCIUM 8.3* 8.0* 7.7* 7.2* 7.4*  MG 2.4 2.5*  --   --   --   PHOS 3.3  --   --   --   --      Liver Function Tests: Recent Labs  Lab 03/09/24 0556 03/10/24 0408 03/11/24 0454 03/12/24 0349 03/13/24 0554  AST 509* 499* 576* 473* 417*  ALT 160* 154* 176* 172* 157*  ALKPHOS 1,466* 1,211* 1,045* 891* 795*  BILITOT 11.4* 10.2* 7.0* 5.9* 5.9*  PROT 6.6 5.9* 6.2* 5.8* 5.9*  ALBUMIN 1.7* 1.7* 1.8* 1.7* 2.1*   Recent Labs  Lab 03/08/24 1558  LIPASE 25   Recent Labs  Lab 03/08/24 1558  AMMONIA 23    CBC: Recent Labs  Lab 03/08/24 1558 03/09/24 0556 03/10/24 0408 03/11/24 0454 03/12/24 0349 03/13/24 0908  WBC 17.8* 15.1* 12.1* 12.0* 9.4 8.6  NEUTROABS 16.4*  --  11.4*  --   --   --   HGB 9.0* 9.0* 8.1* 8.0* 8.3* 7.6*  HCT 27.5* 28.2* 23.2* 23.9* 25.4* 22.8*  MCV 95.5 96.9 90.6 91.9 96.2 93.8  PLT 269 244 263 264 223 198    Cardiac Enzymes: No results for input(s): "CKTOTAL", "CKMB", "CKMBINDEX", "TROPONINI" in the last 168 hours.  BNP: Invalid input(s): "POCBNP"  CBG: Recent Labs  Lab 03/08/24 1726 03/08/24 1938 03/09/24 0116 03/10/24 0820 03/12/24 1658  GLUCAP 42* 136* 174* 100* 80    Microbiology: Results for orders placed or performed during the hospital encounter of 03/08/24  Urine Culture  Status: Abnormal   Collection Time: 03/08/24  7:43 PM   Specimen: Urine, Clean Catch  Result Value Ref Range Status   Specimen Description   Final    URINE, CLEAN CATCH Performed at [facility], [address] [place], [place] [address]    Special Requests   Final    NONE Performed at [facility], [address], [place], [place] [address]    Culture >=100,000 COLONIES/mL ESCHERICHIA COLI (A)  Final   Report Status 03/11/2024 FINAL  Final   Organism ID, Bacteria ESCHERICHIA COLI (A)  Final      Susceptibility   Escherichia coli - MIC*    AMPICILLIN <=2 SENSITIVE Sensitive     CEFAZOLIN <=4 SENSITIVE Sensitive     CEFEPIME <=0.12 SENSITIVE Sensitive     CEFTRIAXONE <=0.25 SENSITIVE Sensitive     CIPROFLOXACIN <=0.25 SENSITIVE Sensitive      GENTAMICIN <=1 SENSITIVE Sensitive     IMIPENEM <=0.25 SENSITIVE Sensitive     NITROFURANTOIN <=16 SENSITIVE Sensitive     TRIMETH/SULFA <=20 SENSITIVE Sensitive     AMPICILLIN/SULBACTAM <=2 SENSITIVE Sensitive     PIP/TAZO <=4 SENSITIVE Sensitive ug/mL    * >=100,000 COLONIES/mL ESCHERICHIA COLI  Blood culture (routine x 2)     Status: None   Collection Time: 03/08/24 11:53 PM   Specimen: BLOOD  Result Value Ref Range Status   Specimen Description BLOOD BLOOD LEFT HAND  Final   Special Requests   Final    BOTTLES DRAWN AEROBIC AND ANAEROBIC Blood Culture adequate volume   Culture   Final    NO GROWTH 5 DAYS Performed at [facility], [address], [place], [place] [address]    Report Status 03/13/2024 FINAL  Final  Blood culture (routine x 2)     Status: None   Collection Time: 03/08/24 11:55 PM   Specimen: BLOOD  Result Value Ref Range Status   Specimen Description BLOOD BLOOD LEFT HAND  Final   Special Requests   Final    BOTTLES DRAWN AEROBIC ONLY Blood Culture adequate volume   Culture   Final    NO GROWTH 5 DAYS Performed at [facility], [address], [place], [place] [address]    Report Status 03/13/2024 FINAL  Final    Coagulation Studies: No results for input(s): "LABPROT", "INR" in the last 72 hours.  Urinalysis: No results for input(s): "COLORURINE", "LABSPEC", "PHURINE", "GLUCOSEU", "HGBUR", "BILIRUBINUR", "KETONESUR", "PROTEINUR", "UROBILINOGEN", "NITRITE", "LEUKOCYTESUR" in the last 72 hours.  Invalid input(s): "APPERANCEUR"    Imaging: No results found.   Medications:    albumin human 12.5 g (03/13/24 1012)   piperacillin-tazobactam (ZOSYN)  IV 3.375 g (03/13/24 1009)    Fe Fum-Vit C-Vit B12-FA  1 capsule Oral BID   feeding supplement  1 Container Oral TID BM   pantoprazole (PROTONIX) IV  40 mg Intravenous Q12H   sodium bicarbonate  1,300 mg Oral BID   sodium chloride flush  3 mL Intravenous Q12H   sodium zirconium cyclosilicate  10 g Oral  Daily   acetaminophen **OR** acetaminophen, bisacodyl, HYDROmorphone (DILAUDID) injection, HYDROmorphone, ipratropium-albuterol, ondansetron **OR** ondansetron (ZOFRAN) IV  Assessment/ Plan:  [name] is a 78 y.o.  female  with  hypertension, hyperlipidemia, coronary artery disease s/p DES x 2, staage IV metastatic breast cancer to the bone, chronic pain syndrome, left renal cell carcinoma, admitted on 03/09/2024 for Obstructive jaundice [K83.1]   1.  Acute kidney injury secondary to ATN from sepsis and dehydration.  Urine cultures growing E. coli. -No further increase in creatinine. - Electrolytes and volume status are acceptable.  No acute indication for dialysis at present. - Agree with supportive care and treatment of UTI as you are doing.   2.  Left upper pole renal mass consistent with renal cell carcinoma - Continue to follow-up with urology as outpatient   3.  Hyperkalemia - Today's potassium level has improved.  Follow low potassium diet.  Consider changing feeding supplement to Nepro instead of boost.  Agree with Lokelma. Potassium 5  03/13/2024   4.  Acute metabolic acidosis. Continue sodium bicarbonate supplementation. Improving 17>20   5.  Generalized edema, hypoalbuminemia Continue albumin    LOS: 4 Decklyn Hyder P Henry Loge 4/13/20251:55 PM

## 2024-03-13 NOTE — Plan of Care (Signed)
       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  Consider starting SSRI tomorrow-per primary attending discretion X X

## 2024-03-13 NOTE — Progress Notes (Addendum)
 Progress Note   Patient: Stacey [name] ZOX:[id number] DOB: 1946-07-03 DOA: 03/09/2024     4 DOS: the patient was seen and examined on 03/13/2024   Brief hospital course:  Stacey  B [name] is a 78 y.o. 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    Patient was later transferred to [facility] for ERCP.   On presentation patient was quite lethargic and somnolent with stable vital, Initial workup notable for WBC of 17.8, hemoglobin of 9.0, sodium 128, BUN 38, creat 1.91, alkaline phosphatase 1489, AST 550, ALT 171, GFR 27. Urinalysis with large hematuria, leukocytes, many bacteria. CT of the head with no intracranial abnormality. CT chest/abdomen/pelvis with no acute abnormality. MRCP with severe intra and extrahepatic biliary ductal dilation with sharp narrowing common bile duct in the pancreatic head. Patient was started on Zosyn, IV fluids.    Patient was taken for ERCP by GI which shows duodenal ulcer, stricture and ampulla stenosis.  A stent was placed with drainage of purulent discharge.  GI is recommending a repeat ERCP in 2 to 3 months for stent removal and a possible sphincterotomy off the Plavix.   4/10: Vital stable, labs with improving leukocytosis, hemoglobin decreased to 8.1, CMP with sodium of 127, chloride 95, blood glucose 123, worsening renal function with BUN of 47, creatinine 2.28, small improvement in transaminitis with AST 499, ALT 154, alkaline phosphatase 1211 and T. bili of 10.2.  Preliminary blood cultures negative.  Urine cultures pending. Ordered more IV fluid with NS.  Hyponatremia labs were also ordered Patient is wheelchair-bound at baseline, son was  asking about going to rehab.  PT and OT evaluation ordered.   4/11: Hemodynamically stable with worsening renal function and hyperkalemia today.  Slight worsening of transaminitis with improvement of alkaline phosphatase and T. bili.  Renal ultrasound with no hydronephrosis, consistent with medical renal disease and renal cell carcinoma.  Nephrology was also consulted.     4/12: No significant change in renal function.  Persistent transaminitis but shows a downward trend.  Leukocytosis has normalized   4/13: Noted to have a drop in her H&H    Assessment and Plan:  * Acute cholangitis # Obstructive Jaundice  # Transaminitis Patient presented with markedly elevated LFTs with evidence of CBD obstruction in the pancreatic head, unclear whether stone versus mass.   She does have a history of metastatic breast cancer, however no known hepatic mets. S/p ERCP by GI, found to have duodenal ulcer, duodenal and sphincter stenosis.  Stent was placed and the purulent discharge was removed. -Patient needs a repeat ERCP in 2 to 3 months for stent removal - Per gastroenterology, continue with Zosyn and switch to ciprofloxacin to complete a 2-week course of therapy -Monitor liver function     AKI (acute kidney injury) (HCC) Worsening creatinine at 2.43 noted, baseline seems to be less than 1.   Renal ultrasound with no hydronephrosis, consistent with medical renal disease and prior diagnosis of renal cell carcinoma No significant change with IV fluid hydration. Appreciate nephrology input, recommends IV albumin, 12.5 g twice daily for 3 days Avoid nephrotoxins Monitor renal function closely    Hyperkalemia Likely due to worsening renal function Continue Lokelma Improved.  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 She [facility] with [facility].      Chronic combined systolic and diastolic CHF (congestive heart failure) (HCC) Patient with history of recovered EF now, last echocardiogram done in February 2024 with normal EF, it was as low as 30 to 35% in 2019 due to ischemic cardiomyopathy.  Clinically appears euvolemic -Monitor volume status closely -Daily weight -Strict intake and output    CAD in native artery S/p DES to ramus and RCA. No chest pain -Will restart Plavix once cleared by GI   COPD (chronic obstructive pulmonary disease) (HCC) No acute exacerbation. - Continue home bronchodilators     UTI UA concerning for UTI, urine culture yields E coli Patient is already on Zosyn     Primary malignant neoplasm of breast with metastasis (HCC) - On palliative fulvestrant  - Outpatient follow-up   Hypertension Blood pressure currently within goal. -Initially holding antihypertensives due to risk of becoming hypotensive.-We will restart as needed   Acute on chronic anemia Hemoglobin dropped to 7.6 Monitor closely and transfuse for hemoglobin less than 7           Subjective: Patient is sitting up in bed, eating breakfast and has no new complaints.  Physical Exam: Vitals:   03/12/24 2328 03/13/24 0420 03/13/24 0500 03/13/24 0800  BP: (!) 145/67   125/71  Pulse:    (!) 109  Resp:    14  Temp: 98.1 F (36.7 C) 98.4 F (36.9 C)  98.1 F (36.7 C)  TempSrc: Oral Oral  Oral  SpO2:    97%  Weight:   92.7 kg   Height:       General.  Obese elderly lady, in no acute distress.  Improving skin pallor and scleral icterus but still noticeable, awake, alert and oriented x 2 Pulmonary.  Lungs clear bilaterally, normal respiratory effort. CV.  Regular rate and rhythm, no JVD, rub or murmur. Abdomen.  Soft, nontender, nondistended, BS positive.  Central adiposity CNS.  Generalized weakness Extremities.  Trace LE edema, no cyanosis, pulses intact and  symmetrical.   Data Reviewed: Hemoglobin 7.6, platelet count 198, potassium 5.0, sodium 136, creatinine 2.35 Labs reviewed  Family Communication: Plan of care was discussed with patient in detail.  She verbalizes understanding and agrees with the plan  Disposition: Status is: Inpatient Remains inpatient appropriate because: On IV antibiotics for cholangitis  Planned Discharge Destination:  TBD    Time spent: 34  minutes  Author: Read Camel, MD 03/13/2024 1:37 PM  For on call review [email or web address].

## 2024-03-14 DIAGNOSIS — R7989 Other specified abnormal findings of blood chemistry: Secondary | ICD-10-CM | POA: Diagnosis not present

## 2024-03-14 DIAGNOSIS — K8309 Other cholangitis: Secondary | ICD-10-CM | POA: Diagnosis not present

## 2024-03-14 LAB — VITAMIN B12: Vitamin B-12: >7500 pg/mL — ABNORMAL HIGH (ref 180–914)

## 2024-03-14 LAB — CBC
HCT: 23 % — ABNORMAL LOW (ref 36.0–46.0)
Hemoglobin: 7.4 g/dL — ABNORMAL LOW (ref 12.0–15.0)
MCH: 31 pg (ref 26.0–34.0)
MCHC: 32.2 g/dL (ref 30.0–36.0)
MCV: 96.2 fL (ref 80.0–100.0)
Platelets: 188 10*3/uL (ref 150–400)
RBC: 2.39 MIL/uL — ABNORMAL LOW (ref 3.87–5.11)
RDW: 17.9 % — ABNORMAL HIGH (ref 11.5–15.5)
WBC: 8.9 10*3/uL (ref 4.0–10.5)
nRBC: 0.2 % (ref 0.0–0.2)

## 2024-03-14 LAB — CREATININE, SERUM
Creatinine, Ser: 2.35 mg/dL — ABNORMAL HIGH (ref 0.44–1.00)
GFR, Estimated: 21 mL/min — ABNORMAL LOW (ref >60)

## 2024-03-14 MED ORDER — CIPROFLOXACIN HCL 500 MG PO TABS
500.0000 mg | ORAL_TABLET | Freq: Every day | ORAL | Status: DC
  Administered 2024-03-16 – 2024-03-18 (×3): 500 mg via ORAL
  Filled 2024-03-14 (×3): qty 1

## 2024-03-14 MED ORDER — NEPRO/CARBSTEADY PO LIQD
237.0000 mL | Freq: Three times a day (TID) | ORAL | Status: DC
  Administered 2024-03-15 – 2024-03-17 (×7): 237 mL via ORAL

## 2024-03-14 NOTE — Progress Notes (Signed)
 Stacey Minium, MD [facility]   [address] [place], [place] [address] Phone: [phone] Fax : [phone]   Subjective: The patient is sitting up in bed in no apparent distress.  The patient's hemoglobin 2 days ago was 8.3 and it went down to 7.6 yesterday and is 7.4 today.  The patient denies any sign of any GI bleeding.  Her ERCP was found to have ascending colon sinus with a stent placement at that time. The patient's liver functions have improved with her bilirubin being 11.3 on admission that has come down to 5.9.  The patient has a stent placed that should be removed in 2 to 3 months.  The patient had a large duodenal ulcer that made the ERCP complicated due to the ulcer and narrowing.   Objective: Vital signs in last 24 hours: Vitals:   03/14/24 0001 03/14/24 0500 03/14/24 0504 03/14/24 1111  BP: 124/69  127/66 111/68  Pulse: (!) 123     Resp: 15     Temp: 98.4 F (36.9 C)  98.2 F (36.8 C) 97.7 F (36.5 C)  TempSrc: Oral  Oral Oral  SpO2: 99%     Weight:  92 kg    Height:       Weight change: -0.7 kg  Intake/Output Summary (Last 24 hours) at 03/14/2024 1259 Last data filed at 03/14/2024 0700 Gross per 24 hour  Intake 325.14 ml  Output 1300 ml  Net -974.86 ml     Exam: General: The patient is more coherent today than she has been in the past.  In no apparent distress   Lab Results: @LABTEST2 @ Micro Results: Recent Results (from the past 240 hours)  Urine Culture     Status: Abnormal   Collection Time: 03/08/24  7:43 PM   Specimen: Urine, Clean Catch  Result Value Ref Range Status   Specimen Description   Final    URINE, CLEAN CATCH Performed at [facility], [address] [place], [place] [address]    Special Requests   Final    NONE Performed at [facility], [address], [place], [place] [address]    Culture >=100,000 COLONIES/mL ESCHERICHIA COLI (A)  Final   Report Status 03/11/2024 FINAL  Final   Organism ID, Bacteria ESCHERICHIA  COLI (A)  Final      Susceptibility   Escherichia coli - MIC*    AMPICILLIN <=2 SENSITIVE Sensitive     CEFAZOLIN <=4 SENSITIVE Sensitive     CEFEPIME <=0.12 SENSITIVE Sensitive     CEFTRIAXONE <=0.25 SENSITIVE Sensitive     CIPROFLOXACIN <=0.25 SENSITIVE Sensitive     GENTAMICIN <=1 SENSITIVE Sensitive     IMIPENEM <=0.25 SENSITIVE Sensitive     NITROFURANTOIN <=16 SENSITIVE Sensitive     TRIMETH/SULFA <=20 SENSITIVE Sensitive     AMPICILLIN/SULBACTAM <=2 SENSITIVE Sensitive     PIP/TAZO <=4 SENSITIVE Sensitive ug/mL    * >=100,000 COLONIES/mL ESCHERICHIA COLI  Blood culture (routine x 2)     Status: None   Collection Time: 03/08/24 11:53 PM   Specimen: BLOOD  Result Value Ref Range Status   Specimen Description BLOOD BLOOD LEFT HAND  Final   Special Requests   Final    BOTTLES DRAWN AEROBIC AND ANAEROBIC Blood Culture adequate volume   Culture   Final    NO GROWTH 5 DAYS Performed at [facility], [address] [place], [place] [address]    Report Status 03/13/2024 FINAL  Final  Blood culture (routine x  2)     Status: None   Collection Time: 03/08/24 11:55 PM   Specimen: BLOOD  Result Value Ref Range Status   Specimen Description BLOOD BLOOD LEFT HAND  Final   Special Requests   Final    BOTTLES DRAWN AEROBIC ONLY Blood Culture adequate volume   Culture   Final    NO GROWTH 5 DAYS Performed at [facility], [address], [place], [place] [address]    Report Status 03/13/2024 FINAL  Final   Studies/Results: No results found. Medications: I have reviewed the patient's current medications. Scheduled Meds:  Fe Fum-Vit C-Vit B12-FA  1 capsule Oral BID   feeding supplement  1 Container Oral TID BM   pantoprazole (PROTONIX) IV  40 mg Intravenous Q12H   sodium bicarbonate  1,300 mg Oral BID   sodium chloride flush  3 mL Intravenous Q12H   sodium zirconium cyclosilicate  10 g Oral Daily   Continuous Infusions:  albumin human 12.5 g (03/14/24 1100)    piperacillin-tazobactam (ZOSYN)  IV 3.375 g (03/14/24 1059)   PRN Meds:.acetaminophen **OR** acetaminophen, ALPRAZolam, bisacodyl, HYDROmorphone (DILAUDID) injection, HYDROmorphone, ipratropium-albuterol, ondansetron **OR** ondansetron (ZOFRAN) IV   Assessment: Principal Problem:   Acute cholangitis Active Problems:   Hypertension   Primary malignant neoplasm of breast with metastasis (HCC)   Transaminitis   COPD (chronic obstructive pulmonary disease) (HCC)   CAD in native artery   Pain of metastatic malignancy   Chronic combined systolic and diastolic CHF (congestive heart failure) (HCC)   AKI (acute kidney injury) (HCC)   Obstructive jaundice   Pyuria   Hyponatremia   Duodenal stenosis   Acute on chronic anemia   Hyperkalemia    Plan: This patient had a ERCP with a stent placement for ascending cholangitis.  The patient's liver enzymes are slowly decreasing.  The patient will need a repeat ERCP in 2 months.  The patient has been explained that she needs a repeat ERCP for the stent placement.  Nothing further to do from a GI point of view at this time.  I will sign off.  Please call if any further GI concerns or questions.  We would like to thank you for the opportunity to participate in the care of Stacey [name].    LOS: 5 days   Marnee Sink, MD.FACG 03/14/2024, 12:59 PM Pager [phone] 7am-5pm  Check AMION for 5pm -7am coverage and on weekends

## 2024-03-14 NOTE — Progress Notes (Addendum)
 Progress Note   Patient: Stacey [name] ZOX:[id number] DOB: [birth year] DOA: 03/09/2024     5 DOS: the patient was seen and examined on 03/14/2024   Brief hospital course:  [name] is a 78 y.o. 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    Patient was later transferred to [facility] for ERCP.   On presentation patient was quite lethargic and somnolent with stable vital, Initial workup notable for WBC of 17.8, hemoglobin of 9.0, sodium 128, BUN 38, creat 1.91, alkaline phosphatase 1489, AST 550, ALT 171, GFR 27. Urinalysis with large hematuria, leukocytes, many bacteria. CT of the head with no intracranial abnormality. CT chest/abdomen/pelvis with no acute abnormality. MRCP with severe intra and extrahepatic biliary ductal dilation with sharp narrowing common bile duct in the pancreatic head. Patient was started on Zosyn, IV fluids.    Patient was taken for ERCP by GI which shows duodenal ulcer, stricture and ampulla stenosis.  A stent was placed with drainage of purulent discharge.  GI is recommending a repeat ERCP in 2 to 3 months for stent removal and a possible sphincterotomy off the Plavix.   4/10: Vital stable, labs with improving leukocytosis, hemoglobin decreased to 8.1, CMP with sodium of 127, chloride 95, blood glucose 123, worsening renal function with BUN of 47, creatinine 2.28, small improvement in transaminitis with AST 499, ALT 154, alkaline phosphatase 1211 and T. bili of 10.2.  Preliminary blood cultures negative.  Urine cultures pending. Ordered more IV fluid with NS.  Hyponatremia labs were also ordered Patient is wheelchair-bound at baseline, son was  asking about going to rehab.  PT and OT evaluation ordered.   4/11: Hemodynamically stable with worsening renal function and hyperkalemia today.  Slight worsening of transaminitis with improvement of alkaline phosphatase and T. bili.  Renal ultrasound with no hydronephrosis, consistent with medical renal disease and renal cell carcinoma.  Nephrology was also consulted.     4/12: No significant change in renal function.  Persistent transaminitis but shows a downward trend.  Leukocytosis has normalized     4/13: Noted to have a drop in her H&H  4/14 : No new complaints     Assessment and Plan:   * Acute cholangitis # Obstructive Jaundice  # Transaminitis Patient presented with markedly elevated LFTs with evidence of CBD obstruction in the pancreatic head, unclear whether stone versus mass.   She does have a history of metastatic breast cancer, however no known hepatic mets. S/p ERCP by GI, found to have duodenal ulcer, duodenal and sphincter stenosis.  Stent was placed and the purulent discharge was removed. -Patient needs a repeat ERCP in 2 to 3 months for stent removal - Total bilirubin shows a downward trend - Per gastroenterology, continue with Zosyn # 6 and switch to ciprofloxacin to complete a 2-week course of therapy -Monitor liver function     AKI (acute kidney injury) (HCC) Worsening creatinine at 2.43 noted, baseline seems to be less than 1.   Renal ultrasound with no hydronephrosis, consistent with medical renal disease and prior diagnosis of renal cell carcinoma No significant change in renal function Appreciate nephrology input, recommends IV albumin, 12.5 g twice daily for 3 days Avoid nephrotoxins Monitor renal  function closely     Hyperkalemia Likely due to worsening renal function Continue Lokelma Improved.     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 She follows with [facility] pain clinic.      Chronic combined systolic and diastolic CHF (congestive heart failure) (HCC) Patient with history of recovered EF now, last echocardiogram done in February 2024 with normal EF, it was as low as 30 to 35% in 2019 due to ischemic cardiomyopathy.  Clinically appears euvolemic -Monitor volume status closely -Daily weight -Strict intake and output     CAD in native artery S/p DES to ramus and RCA. No chest pain -Will restart Plavix once cleared by GI    COPD (chronic obstructive pulmonary disease) (HCC) No acute exacerbation. - Continue home bronchodilators     UTI Patient with pyuria and urine culture yields E. coli Patient is already on Zosyn # 6     Primary malignant neoplasm of breast with metastasis (HCC) - On palliative fulvestrant  - Outpatient follow-up    Hypertension Blood pressure currently within goal. -Initially holding antihypertensives due to risk of becoming hypotensive.-We will restart as needed    Acute on chronic anemia Hemoglobin dropped to 7.4 Monitor closely and transfuse for hemoglobin less than 7      Obesity (BMI 39) Complicates overall prognosis and care      Subjective: Patient is seen and examined at bedside.  Events of overnight noted.  Required anxiolytics  Physical Exam: Vitals:   03/14/24 0001 03/14/24 0500 03/14/24 0504 03/14/24 1111  BP: 124/69  127/66 111/68  Pulse: (!) 123     Resp: 15     Temp: 98.4 F (36.9 C)  98.2 F (36.8 C) 97.7 F (36.5 C)  TempSrc: Oral  Oral Oral  SpO2: 99%     Weight:  92 kg    Height:       General.  Obese elderly lady, in no acute distress.  Improving skin pallor and scleral icterus but still noticeable, awake, alert and oriented x 2 Pulmonary.  Lungs clear bilaterally, normal respiratory effort. CV.  Regular rate and rhythm, no JVD, rub or murmur. Abdomen.  Soft, nontender,  nondistended, BS positive.  Central adiposity CNS.  Generalized weakness Extremities.  Trace LE edema, no cyanosis, pulses intact and symmetrical.  Data Reviewed: Hemoglobin 7.4, vitamin B12 greater than 7500, creatinine 2.35 Labs reviewed  Family Communication: None  Disposition: Status is: Inpatient Remains inpatient appropriate because: On antibiotics for cholangitis  Planned Discharge Destination:  TBD    Time spent: 35  minutes  Author: Read Camel, MD 03/14/2024 12:58 PM  For on call review [email or web address].

## 2024-03-14 NOTE — Plan of Care (Signed)

## 2024-03-14 NOTE — Progress Notes (Signed)
 [facility] [place] 03/14/24  Subjective:   Hospital day # 5  Patient seen laying in bed Drowsy but arousable Pleasant No complaints to offer  Renal: 04/13 0701 - 04/14 0700 In: 685.1 [P.O.:360; IV Piggyback:325.1] Out: 1300 [Urine:1300] Lab Results  Component Value Date   CREATININE 2.35 (H) 03/14/2024   CREATININE 2.35 (H) 03/13/2024   CREATININE 2.42 (H) 03/12/2024     Objective:  Vital signs in last 24 hours:  Temp:  [97.7 F (36.5 C)-98.4 F (36.9 C)] 97.7 F (36.5 C) (04/14 1111) Pulse Rate:  [44-123] 123 (04/14 0001) Resp:  [15-16] 15 (04/14 0001) BP: (111-145)/(66-69) 111/68 (04/14 1111) SpO2:  [98 %-99 %] 99 % (04/14 0001) Weight:  [92 kg] 92 kg (04/14 0500)  Weight change: -0.7 kg Filed Weights   03/12/24 0500 03/13/24 0500 03/14/24 0500  Weight: 91.1 kg 92.7 kg 92 kg    Intake/Output:    Intake/Output Summary (Last 24 hours) at 03/14/2024 1529 Last data filed at 03/14/2024 1300 Gross per 24 hour  Intake 565.14 ml  Output 1300 ml  Net -734.86 ml     Physical Exam: General: No acute distress, laying in the bed  HEENT Moist oral mucous membranes  Pulm/lungs Normal breathing effort, [place] O2  CVS/Heart Tachycardic, irregular  Abdomen:  Soft, nontender, nondistended  Extremities: + dependent edema  Neurologic: Able to follow simple commands  Skin: No acute rashes, jaundice noted          Basic Metabolic Panel:  Recent Labs  Lab 03/09/24 0556 03/10/24 0408 03/11/24 0454 03/12/24 0349 03/13/24 0555 03/14/24 0337  NA 126* 127* 130* 135 136  --   K 4.8 5.1 5.2* 5.1 5.0  --   CL 93* 95* 98 106 106  --   CO2 21* 22 21* 17* 20*  --   GLUCOSE 159* 123* 114* 80 102*  --   BUN 40* 47* 46* 42* 40*  --   CREATININE 1.99* 2.28* 2.43* 2.42* 2.35* 2.35*  CALCIUM 8.3* 8.0* 7.7* 7.2* 7.4*  --   MG 2.4 2.5*  --   --   --   --   PHOS 3.3  --   --   --   --   --      CBC: Recent Labs  Lab 03/08/24 1558  03/09/24 0556 03/10/24 0408 03/11/24 0454 03/12/24 0349 03/13/24 0908 03/14/24 0337  WBC 17.8*   < > 12.1* 12.0* 9.4 8.6 8.9  NEUTROABS 16.4*  --  11.4*  --   --   --   --   HGB 9.0*   < > 8.1* 8.0* 8.3* 7.6* 7.4*  HCT 27.5*   < > 23.2* 23.9* 25.4* 22.8* 23.0*  MCV 95.5   < > 90.6 91.9 96.2 93.8 96.2  PLT 269   < > 263 264 223 198 188   < > = values in this interval not displayed.      Lab Results  Component Value Date   HEPBSAG NON REACTIVE 03/08/2024   HEPBIGM NON REACTIVE 03/08/2024      Microbiology:  Recent Results (from the past 240 hours)  Urine Culture     Status: Abnormal   Collection Time: 03/08/24  7:43 PM   Specimen: Urine, Clean Catch  Result Value Ref Range Status   Specimen Description   Final    URINE, CLEAN CATCH Performed at [facility], [address] [place], [place] [address]    Special Requests   Final  NONE Performed at [facility], [address], [place], [place] [address]    Culture >=100,000 COLONIES/mL ESCHERICHIA COLI (A)  Final   Report Status 03/11/2024 FINAL  Final   Organism ID, Bacteria ESCHERICHIA COLI (A)  Final      Susceptibility   Escherichia coli - MIC*    AMPICILLIN <=2 SENSITIVE Sensitive     CEFAZOLIN <=4 SENSITIVE Sensitive     CEFEPIME <=0.12 SENSITIVE Sensitive     CEFTRIAXONE <=0.25 SENSITIVE Sensitive     CIPROFLOXACIN <=0.25 SENSITIVE Sensitive     GENTAMICIN <=1 SENSITIVE Sensitive     IMIPENEM <=0.25 SENSITIVE Sensitive     NITROFURANTOIN <=16 SENSITIVE Sensitive     TRIMETH/SULFA <=20 SENSITIVE Sensitive     AMPICILLIN/SULBACTAM <=2 SENSITIVE Sensitive     PIP/TAZO <=4 SENSITIVE Sensitive ug/mL    * >=100,000 COLONIES/mL ESCHERICHIA COLI  Blood culture (routine x 2)     Status: None   Collection Time: 03/08/24 11:53 PM   Specimen: BLOOD  Result Value Ref Range Status   Specimen Description BLOOD BLOOD LEFT HAND  Final   Special Requests   Final    BOTTLES DRAWN AEROBIC AND ANAEROBIC Blood Culture  adequate volume   Culture   Final    NO GROWTH 5 DAYS Performed at [facility], [address], [place], [place] [address]    Report Status 03/13/2024 FINAL  Final  Blood culture (routine x 2)     Status: None   Collection Time: 03/08/24 11:55 PM   Specimen: BLOOD  Result Value Ref Range Status   Specimen Description BLOOD BLOOD LEFT HAND  Final   Special Requests   Final    BOTTLES DRAWN AEROBIC ONLY Blood Culture adequate volume   Culture   Final    NO GROWTH 5 DAYS Performed at [facility], [address], [place], [place] [address]    Report Status 03/13/2024 FINAL  Final    Coagulation Studies: No results for input(s): "LABPROT", "INR" in the last 72 hours.  Urinalysis: No results for input(s): "COLORURINE", "LABSPEC", "PHURINE", "GLUCOSEU", "HGBUR", "BILIRUBINUR", "KETONESUR", "PROTEINUR", "UROBILINOGEN", "NITRITE", "LEUKOCYTESUR" in the last 72 hours.  Invalid input(s): "APPERANCEUR"    Imaging: No results found.    Medications:    albumin human 12.5 g (03/14/24 1100)   piperacillin-tazobactam (ZOSYN)  IV 3.375 g (03/14/24 1059)    [START ON 03/16/2024] ciprofloxacin  500 mg Oral Q breakfast   Fe Fum-Vit C-Vit B12-FA  1 capsule Oral BID   feeding supplement  1 Container Oral TID BM   pantoprazole (PROTONIX) IV  40 mg Intravenous Q12H   sodium bicarbonate  1,300 mg Oral BID   sodium chloride flush  3 mL Intravenous Q12H   sodium zirconium cyclosilicate  10 g Oral Daily   acetaminophen **OR** acetaminophen, ALPRAZolam, bisacodyl, HYDROmorphone (DILAUDID) injection, HYDROmorphone, ipratropium-albuterol, ondansetron **OR** ondansetron (ZOFRAN) IV  Assessment/ Plan:  78 y.o. 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  Acute kidney injury secondary to ATN from sepsis and  dehydration.  Urine cultures growing E. coli. - Creatinine remains stable today - Urine output acceptable, 1.3L.  - No acute indication for dialysis - Continue supportive care and treatment of UTI   2.  Left upper pole renal mass consistent with renal cell carcinoma - Will follow-up with urology as outpatient  3.  Hyperkalemia - Potassium remains 5.0.  Follow low potassium diet. Supplement changed to Nepro.  Agree with Lokelma.  4.  Acute metabolic acidosis. Continue oral sodium bicarbonate supplementation.  5.  Generalized edema, hypoalbuminemia Receiving IV albumin 12.5g twice daily.     LOS: 5 [facility] 4/14/20253:29 PM  Central [address] [place], [place] [phone]

## 2024-03-15 DIAGNOSIS — K8309 Other cholangitis: Secondary | ICD-10-CM | POA: Diagnosis not present

## 2024-03-15 LAB — CBC
HCT: 20.1 % — ABNORMAL LOW (ref 36.0–46.0)
Hemoglobin: 6.7 g/dL — ABNORMAL LOW (ref 12.0–15.0)
MCH: 30.7 pg (ref 26.0–34.0)
MCHC: 33.3 g/dL (ref 30.0–36.0)
MCV: 92.2 fL (ref 80.0–100.0)
Platelets: 180 10*3/uL (ref 150–400)
RBC: 2.18 MIL/uL — ABNORMAL LOW (ref 3.87–5.11)
RDW: 18.6 % — ABNORMAL HIGH (ref 11.5–15.5)
WBC: 7.4 10*3/uL (ref 4.0–10.5)
nRBC: 0 % (ref 0.0–0.2)

## 2024-03-15 LAB — RENAL FUNCTION PANEL
Albumin: 2.3 g/dL — ABNORMAL LOW (ref 3.5–5.0)
Anion gap: 8 (ref 5–15)
BUN: 37 mg/dL — ABNORMAL HIGH (ref 8–23)
CO2: 23 mmol/L (ref 22–32)
Calcium: 7 mg/dL — ABNORMAL LOW (ref 8.9–10.3)
Chloride: 109 mmol/L (ref 98–111)
Creatinine, Ser: 2.28 mg/dL — ABNORMAL HIGH (ref 0.44–1.00)
GFR, Estimated: 22 mL/min — ABNORMAL LOW (ref >60)
Glucose, Bld: 122 mg/dL — ABNORMAL HIGH (ref 70–99)
Phosphorus: 1.9 mg/dL — ABNORMAL LOW (ref 2.5–4.6)
Potassium: 4.7 mmol/L (ref 3.5–5.1)
Sodium: 140 mmol/L (ref 135–145)

## 2024-03-15 LAB — HEMOGLOBIN AND HEMATOCRIT, BLOOD
HCT: 26.4 % — ABNORMAL LOW (ref 36.0–46.0)
Hemoglobin: 8.8 g/dL — ABNORMAL LOW (ref 12.0–15.0)

## 2024-03-15 LAB — ABO/RH: ABO/RH(D): A POS

## 2024-03-15 LAB — PREPARE RBC (CROSSMATCH)

## 2024-03-15 MED ORDER — BISACODYL 10 MG RE SUPP: 10.0000 mg | Freq: Once | RECTAL | Status: DC

## 2024-03-15 MED ORDER — SODIUM CHLORIDE 0.9% IV SOLUTION: Freq: Once | INTRAVENOUS | Status: AC

## 2024-03-15 MED ORDER — METOPROLOL SUCCINATE ER 50 MG PO TB24
75.0000 mg | ORAL_TABLET | Freq: Every day | ORAL | Status: DC
  Administered 2024-03-15 – 2024-03-18 (×4): 75 mg via ORAL
  Filled 2024-03-15 (×4): qty 1

## 2024-03-15 NOTE — Progress Notes (Signed)
 [facility], [place] 03/15/24  Subjective:   Hospital day # 6  Patient seen sitting up in bed Breakfast tray at bedside, partially consumed States she feels well Room air   Renal: 04/14 0701 - 04/15 0700 In: 480 [P.O.:480] Out: 1950 [Urine:1950] Lab Results  Component Value Date   CREATININE 2.28 (H) 03/15/2024   CREATININE 2.35 (H) 03/14/2024   CREATININE 2.35 (H) 03/13/2024     Objective:  Vital signs in last 24 hours:  Temp:  [98 F (36.7 C)-98.8 F (37.1 C)] 98.4 F (36.9 C) (04/15 1303) Pulse Rate:  [67-123] 97 (04/15 1303) Resp:  [15-22] 18 (04/15 1303) BP: (121-156)/(57-77) 123/57 (04/15 1303) SpO2:  [97 %-100 %] 98 % (04/15 1247) Weight:  [87.7 kg] 87.7 kg (04/15 0525)  Weight change: -4.3 kg Filed Weights   03/13/24 0500 03/14/24 0500 03/15/24 0525  Weight: 92.7 kg 92 kg 87.7 kg    Intake/Output:    Intake/Output Summary (Last 24 hours) at 03/15/2024 1412 Last data filed at 03/15/2024 1100 Gross per 24 hour  Intake 240 ml  Output 1850 ml  Net -1610 ml     Physical Exam: General: No acute distress, laying in the bed  HEENT Moist oral mucous membranes  Pulm/lungs Normal breathing effort  CVS/Heart Tachycardic, irregular  Abdomen:  Soft, nontender, nondistended  Extremities: + dependent edema  Neurologic: Able to follow simple commands  Skin: No acute rashes, jaundice noted          Basic Metabolic Panel:  Recent Labs  Lab 03/09/24 0556 03/10/24 0408 03/11/24 0454 03/12/24 0349 03/13/24 0555 03/14/24 0337 03/15/24 0425  NA 126* 127* 130* 135 136  --  140  K 4.8 5.1 5.2* 5.1 5.0  --  4.7  CL 93* 95* 98 106 106  --  109  CO2 21* 22 21* 17* 20*  --  23  GLUCOSE 159* 123* 114* 80 102*  --  122*  BUN 40* 47* 46* 42* 40*  --  37*  CREATININE 1.99* 2.28* 2.43* 2.42* 2.35* 2.35* 2.28*  CALCIUM 8.3* 8.0* 7.7* 7.2* 7.4*  --  7.0*  MG 2.4 2.5*  --   --   --   --   --   PHOS 3.3  --   --   --   --   --  1.9*      CBC: Recent Labs  Lab 03/08/24 1558 03/09/24 0556 03/10/24 0408 03/11/24 0454 03/12/24 0349 03/13/24 0908 03/14/24 0337 03/15/24 0425  WBC 17.8*   < > 12.1* 12.0* 9.4 8.6 8.9 7.4  NEUTROABS 16.4*  --  11.4*  --   --   --   --   --   HGB 9.0*   < > 8.1* 8.0* 8.3* 7.6* 7.4* 6.7*  HCT 27.5*   < > 23.2* 23.9* 25.4* 22.8* 23.0* 20.1*  MCV 95.5   < > 90.6 91.9 96.2 93.8 96.2 92.2  PLT 269   < > 263 264 223 198 188 180   < > = values in this interval not displayed.      Lab Results  Component Value Date   HEPBSAG NON REACTIVE 03/08/2024   HEPBIGM NON REACTIVE 03/08/2024      Microbiology:  Recent Results (from the past 240 hours)  Urine Culture     Status: Abnormal   Collection Time: 03/08/24  7:43 PM   Specimen: Urine, Clean Catch  Result Value Ref Range Status   Specimen Description  Final    URINE, CLEAN CATCH Performed at [facility], [address] [place], [place] [address]    Special Requests   Final    NONE Performed at [facility], [address], [place], [place] [address]    Culture >=100,000 COLONIES/mL ESCHERICHIA COLI (A)  Final   Report Status 03/11/2024 FINAL  Final   Organism ID, Bacteria ESCHERICHIA COLI (A)  Final      Susceptibility   Escherichia coli - MIC*    AMPICILLIN <=2 SENSITIVE Sensitive     CEFAZOLIN <=4 SENSITIVE Sensitive     CEFEPIME <=0.12 SENSITIVE Sensitive     CEFTRIAXONE <=0.25 SENSITIVE Sensitive     CIPROFLOXACIN <=0.25 SENSITIVE Sensitive     GENTAMICIN <=1 SENSITIVE Sensitive     IMIPENEM <=0.25 SENSITIVE Sensitive     NITROFURANTOIN <=16 SENSITIVE Sensitive     TRIMETH/SULFA <=20 SENSITIVE Sensitive     AMPICILLIN/SULBACTAM <=2 SENSITIVE Sensitive     PIP/TAZO <=4 SENSITIVE Sensitive ug/mL    * >=100,000 COLONIES/mL ESCHERICHIA COLI  Blood culture (routine x 2)     Status: None   Collection Time: 03/08/24 11:53 PM   Specimen: BLOOD  Result Value Ref Range Status   Specimen Description BLOOD BLOOD LEFT HAND   Final   Special Requests   Final    BOTTLES DRAWN AEROBIC AND ANAEROBIC Blood Culture adequate volume   Culture   Final    NO GROWTH 5 DAYS Performed at [facility], [address] [place], [place] [address]    Report Status 03/13/2024 FINAL  Final  Blood culture (routine x 2)     Status: None   Collection Time: 03/08/24 11:55 PM   Specimen: BLOOD  Result Value Ref Range Status   Specimen Description BLOOD BLOOD LEFT HAND  Final   Special Requests   Final    BOTTLES DRAWN AEROBIC ONLY Blood Culture adequate volume   Culture   Final    NO GROWTH 5 DAYS Performed at [facility], [address], [place], [place] [address]    Report Status 03/13/2024 FINAL  Final    Coagulation Studies: No results for input(s): "LABPROT", "INR" in the last 72 hours.  Urinalysis: No results for input(s): "COLORURINE", "LABSPEC", "PHURINE", "GLUCOSEU", "HGBUR", "BILIRUBINUR", "KETONESUR", "PROTEINUR", "UROBILINOGEN", "NITRITE", "LEUKOCYTESUR" in the last 72 hours.  Invalid input(s): "APPERANCEUR"    Imaging: No results found.    Medications:    piperacillin-tazobactam (ZOSYN)  IV 3.375 g (03/15/24 1058)    bisacodyl  10 mg Rectal Once   [START ON 03/16/2024] ciprofloxacin  500 mg Oral Q breakfast   Fe Fum-Vit C-Vit B12-FA  1 capsule Oral BID   feeding supplement (NEPRO CARB STEADY)  237 mL Oral TID BM   metoprolol succinate  75 mg Oral Daily   pantoprazole (PROTONIX) IV  40 mg Intravenous Q12H   sodium bicarbonate  1,300 mg Oral BID   sodium chloride flush  3 mL Intravenous Q12H   acetaminophen **OR** acetaminophen, ALPRAZolam, bisacodyl, HYDROmorphone (DILAUDID) injection, HYDROmorphone, ipratropium-albuterol, ondansetron **OR** ondansetron (ZOFRAN) IV  Assessment/ Plan:  78 y.o. 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  Acute kidney injury secondary to ATN from sepsis and dehydration.  Urine cultures growing E. coli. - Creatinine stable today - Just under 2 L urine output, acceptable.  - Continue to  avoid nephrotoxic agents.  - Continue supportive care and treatment of UTI   2.  Left upper pole renal mass consistent with renal cell carcinoma - Will follow-up with urology as outpatient  3.  Hyperkalemia - Potassium 4.7.  Continue to follow low potassium diet with Nepro.    4.  Acute metabolic acidosis. Continue oral sodium bicarbonate supplementation.  5.  Generalized edema, hypoalbuminemia Received IV albumin 12.5g twice daily.     LOS: 6 [facility] 4/15/20252:12 PM  Central [address] [place], [place] [phone]

## 2024-03-15 NOTE — TOC Progression Note (Signed)
 Transition of Care [facility]) - Progression Note    Patient Details  Name: Stacey [name]  KIMYATTA LECY MRN: [id number] Date of Birth: 09/05/1946  Transition of Care [facility] CM/SW Contact  Baird Bombard, RN Phone Number: 03/15/2024, 3:38 PM  Clinical Narrative:    Spoke with patient son, Baker Bon. Patient lives with son. He is hoping to get patient in a SNF for STR after she returns home. Patient's son will continue to care for patient at home. Patient has a hoyer lift at home and will need EMS at discharge.         Expected Discharge Plan and Services                                               Social Determinants of Health (SDOH) Interventions SDOH Screenings   Food Insecurity: No Food Insecurity (03/09/2024)  Housing: Low Risk  (03/09/2024)  Transportation Needs: No Transportation Needs (03/09/2024)  Recent Concern: Transportation Needs - Unmet Transportation Needs (01/04/2024)   Received from [facility]  Utilities: Not At Risk (03/09/2024)  Financial Resource Strain: High Risk (01/04/2024)   Received from [facility]  Physical Activity: Inactive (08/17/2023)   Received from [facility]  Social Connections: Socially Isolated (03/09/2024)  Stress: No Stress Concern Present (08/17/2023)   Received from [facility]  Tobacco Use: Medium Risk (03/09/2024)  Health Literacy: Inadequate Health Literacy (08/17/2023)   Received from [facility]    Readmission Risk Interventions    03/09/2024    8:15 AM  Readmission Risk Prevention Plan  Transportation Screening Complete  HRI or Home Care Consult Complete  Social Work Consult for Recovery Care Planning/Counseling Complete  Palliative Care Screening Complete  Medication Review Oceanographer) Complete

## 2024-03-15 NOTE — Plan of Care (Signed)

## 2024-03-15 NOTE — Progress Notes (Signed)
 Progress Note   Patient: Stacey [name] ZOX:[id number] DOB: [birth year] DOA: 03/09/2024     6 DOS: the patient was seen and examined on 03/15/2024   Brief hospital course:  [name] is a 78 y.o. female with medical history significant of hypertension, hyperlipidemia, CAD s/p DES to ramus  (2006) and RCA (2018), stage IV metastatic breast cancer to the bone on fulvestrant, chronic pain syndrome 2/2 cancer on intrathecal morphine/bupivacaine and oral dilaudid, left RCC who presented to the ED at [facility] on 03/08/2024 due to weakness/lethargy with nausea/vomiting.    Patient was later transferred to [facility] for ERCP.   On presentation patient was quite lethargic and somnolent with stable vital, Initial workup notable for WBC of 17.8, hemoglobin of 9.0, sodium 128, BUN 38, creat 1.91, alkaline phosphatase 1489, AST 550, ALT 171, GFR 27. Urinalysis with large hematuria, leukocytes, many bacteria. CT of the head with no intracranial abnormality. CT chest/abdomen/pelvis with no acute abnormality. MRCP with severe intra and extrahepatic biliary ductal dilation with sharp narrowing common bile duct in the pancreatic head. Patient was started on Zosyn, IV fluids.    Patient was taken for ERCP by GI which shows duodenal ulcer, stricture and ampulla stenosis.  A stent was placed with drainage of purulent discharge.  GI is recommending a repeat ERCP in 2 to 3 months for stent removal and a possible sphincterotomy off the Plavix.   4/10: Vital stable, labs with improving leukocytosis, hemoglobin decreased to 8.1, CMP with sodium of 127, chloride 95, blood glucose 123, worsening renal function with BUN of 47, creatinine 2.28, small improvement in transaminitis with AST 499, ALT 154, alkaline phosphatase 1211 and T. bili of 10.2.  Preliminary blood cultures negative.  Urine cultures pending. Ordered more IV fluid with NS.  Hyponatremia labs were also ordered Patient is wheelchair-bound at baseline, son was  asking about going to rehab.  PT and OT evaluation ordered.   4/11: Hemodynamically stable with worsening renal function and hyperkalemia today.  Slight worsening of transaminitis with improvement of alkaline phosphatase and T. bili.  Renal ultrasound with no hydronephrosis, consistent with medical renal disease and renal cell carcinoma.  Nephrology was also consulted.     4/12: No significant change in renal function.  Persistent transaminitis but shows a downward trend.  Leukocytosis has normalized     4/13: Noted to have a drop in her H&H   4/14 : No new complaints  4/15 : Hemoglobin dropped to 6.7 g/dl.  Will need transfusion of 1 unit of packed RBC.  Discussed the need for transfusion with her son/healthcare power of attorney and he is in agreement with blood transfusion.     Assessment and Plan:   * Acute cholangitis # Obstructive Jaundice  # Transaminitis Patient presented with markedly elevated LFTs with evidence of CBD obstruction in the pancreatic head, unclear whether stone versus mass.   She does have a history of metastatic breast cancer, however no known hepatic mets. S/p ERCP by GI, found to have duodenal ulcer, duodenal and sphincter stenosis.  Stent was placed and the purulent discharge was removed. -Patient needs a repeat ERCP in 2 to 3 months for stent removal - Total bilirubin shows a downward trend - Per gastroenterology, patient was on IV Zosyn and has been switched to ciprofloxacin to complete a 2-week course of therapy -Monitor liver function     AKI (acute kidney injury) (HCC) Worsening creatinine at 2.43 noted, baseline seems to be less than 1.  Renal ultrasound with no hydronephrosis, consistent with medical renal disease and prior diagnosis of renal cell carcinoma There has been mild improvement in patient's renal function but not back to baseline Appreciate nephrology input, recommended IV albumin, 12.5 g twice daily for 3 days Avoid  nephrotoxins Monitor renal function closely     Hyperkalemia Likely due to worsening renal function Improved. 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 She follows with [facility] pain clinic.      Chronic combined systolic and diastolic CHF (congestive heart failure) (HCC) Patient with history of recovered EF now, last echocardiogram done in February 2024 with normal EF, it was as low as 30 to 35% in 2019 due to ischemic cardiomyopathy.  Clinically appears euvolemic -Monitor volume status closely -Daily weight -Strict intake and output     CAD in native artery S/p DES to ramus and RCA. No chest pain -Will restart Plavix once cleared by GI     COPD (chronic obstructive pulmonary disease) (HCC) No acute exacerbation. - Continue home bronchodilators     UTI Patient with pyuria and urine culture yields E. coli Patient completed antibiotic therapy with IV Zosyn     Primary malignant neoplasm of breast with metastasis (HCC) - On palliative fulvestrant  - Outpatient follow-up     Hypertension Blood pressure currently within goal. -Initially holding antihypertensives due to risk of becoming hypotensive.-We will restart as needed     Acute on chronic anemia secondary to acute illness Hemoglobin dropped to 6.7 Will transfuse 1 unit of packed RBC Discussed the need for blood transfusion with patient's son, Mr Nedra Hai and he consents for patient to receive blood transfusion       Obesity (BMI 39) Complicates overall prognosis and care          Subjective: Patient is seen and examined at the bedside.  She is awake and alert  Physical Exam: Vitals:   03/15/24 0740 03/15/24 1123 03/15/24 1247 03/15/24 1303  BP: (!) 156/77 121/60 132/66 (!) 123/57  Pulse: (!) 116 (!) 117 67 97   Resp: 18 20 18 18   Temp: 98.2 F (36.8 C) 98 F (36.7 C) 98.1 F (36.7 C) 98.4 F (36.9 C)  TempSrc: Oral Oral Oral Oral  SpO2: 99% 100% 98%   Weight:      Height:       General.  Obese elderly lady, in no acute distress.  Improving skin pallor and scleral icterus but still noticeable, awake, alert and oriented x 2 Pulmonary.  Lungs clear bilaterally, normal respiratory effort. CV.  Regular rate and rhythm, no JVD, rub or murmur. Abdomen.  Soft, nontender, nondistended, BS positive.  Central adiposity CNS.  Generalized weakness Extremities.  Trace LE edema, no cyanosis, pulses intact and symmetrical.   Data Reviewed: Hemoglobin 6.7, creatinine 2.28 Labs reviewed  Family Communication: Discussed plan of care with patient's son, Mr Nedra Hai over the phone.  All questions and concerns have been addressed.  He verbalizes understanding and agrees with the plan.  Disposition: Status is: Inpatient Remains inpatient appropriate because: Discharge planning  Planned Discharge Destination: Home with Home Health    Time spent: 38 minutes  Author: Lucile Shutters, MD 03/15/2024 1:40 PM  For on call review [email or web address].

## 2024-03-16 DIAGNOSIS — K8309 Other cholangitis: Secondary | ICD-10-CM | POA: Diagnosis not present

## 2024-03-16 LAB — COMPREHENSIVE METABOLIC PANEL WITH GFR
ALT: 134 U/L — ABNORMAL HIGH (ref 0–44)
AST: 282 U/L — ABNORMAL HIGH (ref 15–41)
Albumin: 2.2 g/dL — ABNORMAL LOW (ref 3.5–5.0)
Alkaline Phosphatase: 636 U/L — ABNORMAL HIGH (ref 38–126)
Anion gap: 8 (ref 5–15)
BUN: 34 mg/dL — ABNORMAL HIGH (ref 8–23)
CO2: 23 mmol/L (ref 22–32)
Calcium: 7 mg/dL — ABNORMAL LOW (ref 8.9–10.3)
Chloride: 106 mmol/L (ref 98–111)
Creatinine, Ser: 2.09 mg/dL — ABNORMAL HIGH (ref 0.44–1.00)
GFR, Estimated: 24 mL/min — ABNORMAL LOW (ref >60)
Glucose, Bld: 123 mg/dL — ABNORMAL HIGH (ref 70–99)
Potassium: 3.6 mmol/L (ref 3.5–5.1)
Sodium: 137 mmol/L (ref 135–145)
Total Bilirubin: 4.1 mg/dL — ABNORMAL HIGH (ref 0.0–1.2)
Total Protein: 6.4 g/dL — ABNORMAL LOW (ref 6.5–8.1)

## 2024-03-16 LAB — CBC
HCT: 27.1 % — ABNORMAL LOW (ref 36.0–46.0)
Hemoglobin: 8.7 g/dL — ABNORMAL LOW (ref 12.0–15.0)
MCH: 30.3 pg (ref 26.0–34.0)
MCHC: 32.1 g/dL (ref 30.0–36.0)
MCV: 94.4 fL (ref 80.0–100.0)
Platelets: 196 10*3/uL (ref 150–400)
RBC: 2.87 MIL/uL — ABNORMAL LOW (ref 3.87–5.11)
RDW: 19.5 % — ABNORMAL HIGH (ref 11.5–15.5)
WBC: 7.2 10*3/uL (ref 4.0–10.5)
nRBC: 0 % (ref 0.0–0.2)

## 2024-03-16 LAB — TYPE AND SCREEN
ABO/RH(D): A POS
Antibody Screen: NEGATIVE
Unit division: 0

## 2024-03-16 LAB — BPAM RBC
Blood Product Expiration Date: 202504152359
ISSUE DATE / TIME: 202504151236
Unit Type and Rh: 600

## 2024-03-16 NOTE — Plan of Care (Signed)

## 2024-03-16 NOTE — Progress Notes (Signed)
 PT Cancellation Note  Patient Details Name: BRANTLEE HINDE MRN: [id number] DOB: 1946/04/05   Cancelled Treatment:    Reason Eval/Treat Not Completed: PT screened, no needs identified, will sign off. Patient evaluated by PT on 03/10/24 and found to be at baseline level of function with no skilled PT needs identified.  Will complete PT orders at this time, MD notified.    Lavenia Post PT, DPT 03/16/24, 8:33 AM

## 2024-03-16 NOTE — Progress Notes (Signed)
 PROGRESS NOTE    Stacey [name]  WUJ:[id number] DOB: [birth year] DOA: 03/09/2024 PCP: Ignatius Specking, MD    Assessment & Plan:   Principal Problem:   Acute cholangitis Active Problems:   Transaminitis   Obstructive jaundice   AKI (acute kidney injury) (HCC)   Hyponatremia   Hyperkalemia   Pain of metastatic malignancy   Chronic combined systolic and diastolic CHF (congestive heart failure) (HCC)   CAD in native artery   COPD (chronic obstructive pulmonary disease) (HCC)   Pyuria   Primary malignant neoplasm of breast with metastasis (HCC)   Hypertension   Acute on chronic anemia   Duodenal stenosis  Assessment and Plan: Acute cholangitis: w/ obstructive jaundice. S/p ERCP by GI, found to have duodenal ulcer, duodenal and sphincter stenosis.  Stent was placed and the purulent discharge was removed. Will need to have a repeat ERCP in 2-3 months for stent removal. Continue on cipro    Transaminits: likely secondary to above. Still elevated but trending down    AKI. Cr is labile. 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 Follows with [facility] pain clinic.    Chronic combined CHF: appears euvolemic. Monitor I/Os   Hx of CAD: s/p DES to ramus and RCA. No chest pain currently    COPD: w/o exacerbation. Bronchodilators prn    UTI: completed abx course. Urine cx grew e. coli  Primary malignant neoplasm of breast: w/ mets. Management per onco outpatient      HTN: WNL currently. Will continue to monitor    Normocytic anemia: s/p 1 unit of pRBCs transfused so far. No need for a transfusion currently    Morbid obesity: BMI 38.2. Complicates overall care & prognosis          DVT prophylaxis: SCDs Code Status: DNR Family Communication: discussed pt's care w/ pt's son and answered his questions  Disposition Plan: likely d/c back home   Status is: Inpatient Remains inpatient appropriate because: severity of illness    Level of care: Progressive Consultants:    Procedures:   Antimicrobials:   Subjective: Pt c/o malaise   Objective: Vitals:   03/16/24 0019 03/16/24 0335 03/16/24 0500 03/16/24 0700  BP: 139/74 121/81    Pulse: 96 99    Resp: 16 18    Temp: 98.1 F (36.7 C) 97.9 F (36.6 C)  (!) 97.5 F (36.4 C)  TempSrc: Oral Oral  Oral  SpO2: 100% 98%    Weight:   88.8 kg   Height:        Intake/Output Summary (Last 24 hours) at 03/16/2024 1210 Last data filed at 03/16/2024 0955 Gross per 24 hour  Intake 770 ml  Output 1800 ml  Net -1030 ml   Filed Weights   03/14/24 0500 03/15/24 0525 03/16/24 0500  Weight: 92 kg 87.7 kg 88.8 kg    Examination:  General exam: Appears calm and comfortable  Respiratory system: decreased breath sounds b/l  Cardiovascular system: S1 & S2+. No rubs, gallops or clicks.  Gastrointestinal system: Abdomen is nondistended, soft and nontender. Normal bowel sounds heard. Central nervous system: Alert and oriented.  Psychiatry: Judgement and insight appear normal. Mood & affect appropriate.     Data  Reviewed: I have personally reviewed following labs and imaging studies  CBC: Recent Labs  Lab 03/10/24 0408 03/11/24 0454 03/12/24 0349 03/13/24 0908 03/14/24 0337 03/15/24 0425 03/15/24 1835  WBC 12.1* 12.0* 9.4 8.6 8.9 7.4  --   NEUTROABS 11.4*  --   --   --   --   --   --   HGB 8.1* 8.0* 8.3* 7.6* 7.4* 6.7* 8.8*  HCT 23.2* 23.9* 25.4* 22.8* 23.0* 20.1* 26.4*  MCV 90.6 91.9 96.2 93.8 96.2 92.2  --   PLT 263 264 223 198 188 180  --    Basic Metabolic Panel: Recent Labs  Lab 03/10/24 0408 03/11/24 0454 03/12/24 0349 03/13/24 0555 03/14/24 0337 03/15/24 0425  NA 127* 130* 135  136  --  140  K 5.1 5.2* 5.1 5.0  --  4.7  CL 95* 98 106 106  --  109  CO2 22 21* 17* 20*  --  23  GLUCOSE 123* 114* 80 102*  --  122*  BUN 47* 46* 42* 40*  --  37*  CREATININE 2.28* 2.43* 2.42* 2.35* 2.35* 2.28*  CALCIUM 8.0* 7.7* 7.2* 7.4*  --  7.0*  MG 2.5*  --   --   --   --   --   PHOS  --   --   --   --   --  1.9*   GFR: Estimated Creatinine Clearance: 20.5 mL/min (A) (by C-G formula based on SCr of 2.28 mg/dL (H)). Liver Function Tests: Recent Labs  Lab 03/10/24 0408 03/11/24 0454 03/12/24 0349 03/13/24 0554 03/15/24 0425  AST 499* 576* 473* 417*  --   ALT 154* 176* 172* 157*  --   ALKPHOS 1,211* 1,045* 891* 795*  --   BILITOT 10.2* 7.0* 5.9* 5.9*  --   PROT 5.9* 6.2* 5.8* 5.9*  --   ALBUMIN 1.7* 1.8* 1.7* 2.1* 2.3*   No results for input(s): "LIPASE", "AMYLASE" in the last 168 hours. No results for input(s): "AMMONIA" in the last 168 hours. Coagulation Profile: No results for input(s): "INR", "PROTIME" in the last 168 hours. Cardiac Enzymes: No results for input(s): "CKTOTAL", "CKMB", "CKMBINDEX", "TROPONINI" in the last 168 hours. BNP (last 3 results) No results for input(s): "PROBNP" in the last 8760 hours. HbA1C: No results for input(s): "HGBA1C" in the last 72 hours. CBG: Recent Labs  Lab 03/10/24 0820 03/12/24 1658  GLUCAP 100* 80   Lipid Profile: No results for input(s): "CHOL", "HDL", "LDLCALC", "TRIG", "CHOLHDL", "LDLDIRECT" in the last 72 hours. Thyroid Function Tests: No results for input(s): "TSH", "T4TOTAL", "FREET4", "T3FREE", "THYROIDAB" in the last 72 hours. Anemia Panel: Recent Labs    03/13/24 1916  VITAMINB12 >7,500*   Sepsis Labs: No results for input(s): "PROCALCITON", "LATICACIDVEN" in the last 168 hours.  Recent Results (from the past 240 hours)  Urine Culture     Status: Abnormal   Collection Time: 03/08/24  7:43 PM   Specimen: Urine, Clean Catch  Result Value Ref Range Status   Specimen Description   Final    URINE, CLEAN  CATCH Performed at [facility], [address], [place], [place] [address]    Special Requests   Final    NONE Performed at [facility], [address], [place], [place] [address]    Culture >=100,000 COLONIES/mL ESCHERICHIA COLI (A)  Final   Report Status 03/11/2024 FINAL  Final   Organism ID, Bacteria ESCHERICHIA COLI (A)  Final      Susceptibility  Escherichia coli - MIC*    AMPICILLIN <=2 SENSITIVE Sensitive     CEFAZOLIN <=4 SENSITIVE Sensitive     CEFEPIME <=0.12 SENSITIVE Sensitive     CEFTRIAXONE <=0.25 SENSITIVE Sensitive     CIPROFLOXACIN <=0.25 SENSITIVE Sensitive     GENTAMICIN <=1 SENSITIVE Sensitive     IMIPENEM <=0.25 SENSITIVE Sensitive     NITROFURANTOIN <=16 SENSITIVE Sensitive     TRIMETH/SULFA <=20 SENSITIVE Sensitive     AMPICILLIN/SULBACTAM <=2 SENSITIVE Sensitive     PIP/TAZO <=4 SENSITIVE Sensitive ug/mL    * >=100,000 COLONIES/mL ESCHERICHIA COLI  Blood culture (routine x 2)     Status: None   Collection Time: 03/08/24 11:53 PM   Specimen: BLOOD  Result Value Ref Range Status   Specimen Description BLOOD BLOOD LEFT HAND  Final   Special Requests   Final    BOTTLES DRAWN AEROBIC AND ANAEROBIC Blood Culture adequate volume   Culture   Final    NO GROWTH 5 DAYS Performed at [facility], [address], [place], [place] [address]    Report Status 03/13/2024 FINAL  Final  Blood culture (routine x 2)     Status: None   Collection Time: 03/08/24 11:55 PM   Specimen: BLOOD  Result Value Ref Range Status   Specimen Description BLOOD BLOOD LEFT HAND  Final   Special Requests   Final    BOTTLES DRAWN AEROBIC ONLY Blood Culture adequate volume   Culture   Final    NO GROWTH 5 DAYS Performed at [facility], [address], [place], [place] [address]    Report Status 03/13/2024 FINAL  Final         Radiology Studies: No results found.      Scheduled Meds:  bisacodyl  10 mg Rectal Once   ciprofloxacin  500 mg Oral Q breakfast   Fe Fum-Vit  C-Vit B12-FA  1 capsule Oral BID   feeding supplement (NEPRO CARB STEADY)  237 mL Oral TID BM   metoprolol succinate  75 mg Oral Daily   pantoprazole (PROTONIX) IV  40 mg Intravenous Q12H   sodium bicarbonate  1,300 mg Oral BID   sodium chloride flush  3 mL Intravenous Q12H   Continuous Infusions:   LOS: 7 days       Alphonsus Jeans, MD [facility] Pager [phone]  If 7PM-7AM, please contact night-coverage [email or web address]  03/16/2024, 12:10 PM

## 2024-03-16 NOTE — Progress Notes (Signed)
 [facility], [place] 03/16/24  Subjective:   Hospital day # 7  Patient sitting up in bed Enjoying breakfast Room air Lower extremity edema remains    Renal: 04/15 0701 - 04/16 0700 In: 530 [Blood:530] Out: 2500 [Urine:2500] Lab Results  Component Value Date   CREATININE 2.28 (H) 03/15/2024   CREATININE 2.35 (H) 03/14/2024   CREATININE 2.35 (H) 03/13/2024     Objective:  Vital signs in last 24 hours:  Temp:  [97.4 F (36.3 C)-98.4 F (36.9 C)] 97.5 F (36.4 C) (04/16 0700) Pulse Rate:  [67-103] 99 (04/16 0335) Resp:  [16-20] 18 (04/16 0335) BP: (121-142)/(54-81) 121/81 (04/16 0335) SpO2:  [98 %-100 %] 98 % (04/16 0335) Weight:  [88.8 kg] 88.8 kg (04/16 0500)  Weight change: 1.1 kg Filed Weights   03/14/24 0500 03/15/24 0525 03/16/24 0500  Weight: 92 kg 87.7 kg 88.8 kg    Intake/Output:    Intake/Output Summary (Last 24 hours) at 03/16/2024 1218 Last data filed at 03/16/2024 0955 Gross per 24 hour  Intake 770 ml  Output 1800 ml  Net -1030 ml     Physical Exam: General: No acute distress, laying in the bed  HEENT Moist oral mucous membranes  Pulm/lungs Normal breathing effort  CVS/Heart Tachycardic, irregular  Abdomen:  Soft, nontender, nondistended  Extremities: + dependent edema  Neurologic: Able to follow simple commands  Skin: No acute rashes, jaundice noted          Basic Metabolic Panel:  Recent Labs  Lab 03/10/24 0408 03/11/24 0454 03/12/24 0349 03/13/24 0555 03/14/24 0337 03/15/24 0425  NA 127* 130* 135 136  --  140  K 5.1 5.2* 5.1 5.0  --  4.7  CL 95* 98 106 106  --  109  CO2 22 21* 17* 20*  --  23  GLUCOSE 123* 114* 80 102*  --  122*  BUN 47* 46* 42* 40*  --  37*  CREATININE 2.28* 2.43* 2.42* 2.35* 2.35* 2.28*  CALCIUM 8.0* 7.7* 7.2* 7.4*  --  7.0*  MG 2.5*  --   --   --   --   --   PHOS  --   --   --   --   --  1.9*     CBC: Recent Labs  Lab 03/10/24 0408 03/11/24 0454 03/12/24 0349  03/13/24 0908 03/14/24 0337 03/15/24 0425 03/15/24 1835  WBC 12.1* 12.0* 9.4 8.6 8.9 7.4  --   NEUTROABS 11.4*  --   --   --   --   --   --   HGB 8.1* 8.0* 8.3* 7.6* 7.4* 6.7* 8.8*  HCT 23.2* 23.9* 25.4* 22.8* 23.0* 20.1* 26.4*  MCV 90.6 91.9 96.2 93.8 96.2 92.2  --   PLT 263 264 223 198 188 180  --       Lab Results  Component Value Date   HEPBSAG NON REACTIVE 03/08/2024   HEPBIGM NON REACTIVE 03/08/2024      Microbiology:  Recent Results (from the past 240 hours)  Urine Culture     Status: Abnormal   Collection Time: 03/08/24  7:43 PM   Specimen: Urine, Clean Catch  Result Value Ref Range Status   Specimen Description   Final    URINE, CLEAN CATCH Performed at [facility], [address] [place], [place] [address]    Special Requests   Final    NONE Performed at [facility], [address], [place], [place] [address]  Culture >=100,000 COLONIES/mL ESCHERICHIA COLI (A)  Final   Report Status 03/11/2024 FINAL  Final   Organism ID, Bacteria ESCHERICHIA COLI (A)  Final      Susceptibility   Escherichia coli - MIC*    AMPICILLIN <=2 SENSITIVE Sensitive     CEFAZOLIN <=4 SENSITIVE Sensitive     CEFEPIME <=0.12 SENSITIVE Sensitive     CEFTRIAXONE <=0.25 SENSITIVE Sensitive     CIPROFLOXACIN <=0.25 SENSITIVE Sensitive     GENTAMICIN <=1 SENSITIVE Sensitive     IMIPENEM <=0.25 SENSITIVE Sensitive     NITROFURANTOIN <=16 SENSITIVE Sensitive     TRIMETH/SULFA <=20 SENSITIVE Sensitive     AMPICILLIN/SULBACTAM <=2 SENSITIVE Sensitive     PIP/TAZO <=4 SENSITIVE Sensitive ug/mL    * >=100,000 COLONIES/mL ESCHERICHIA COLI  Blood culture (routine x 2)     Status: None   Collection Time: 03/08/24 11:53 PM   Specimen: BLOOD  Result Value Ref Range Status   Specimen Description BLOOD BLOOD LEFT HAND  Final   Special Requests   Final    BOTTLES DRAWN AEROBIC AND ANAEROBIC Blood Culture adequate volume   Culture   Final    NO GROWTH 5 DAYS Performed at [facility], [address], [place], [place] [address]    Report Status 03/13/2024 FINAL  Final  Blood culture (routine x 2)     Status: None   Collection Time: 03/08/24 11:55 PM   Specimen: BLOOD  Result Value Ref Range Status   Specimen Description BLOOD BLOOD LEFT HAND  Final   Special Requests   Final    BOTTLES DRAWN AEROBIC ONLY Blood Culture adequate volume   Culture   Final    NO GROWTH 5 DAYS Performed at [facility], [address], [place], [place] [address]    Report Status 03/13/2024 FINAL  Final    Coagulation Studies: No results for input(s): "LABPROT", "INR" in the last 72 hours.  Urinalysis: No results for input(s): "COLORURINE", "LABSPEC", "PHURINE", "GLUCOSEU", "HGBUR", "BILIRUBINUR", "KETONESUR", "PROTEINUR", "UROBILINOGEN", "NITRITE", "LEUKOCYTESUR" in the last 72 hours.  Invalid input(s): "APPERANCEUR"    Imaging: No results found.    Medications:      bisacodyl  10 mg Rectal Once   ciprofloxacin  500 mg Oral Q breakfast   Fe Fum-Vit C-Vit B12-FA  1 capsule Oral BID   feeding supplement (NEPRO CARB STEADY)  237 mL Oral TID BM   metoprolol succinate  75 mg Oral Daily   pantoprazole (PROTONIX) IV  40 mg Intravenous Q12H   sodium bicarbonate  1,300 mg Oral BID   sodium chloride flush  3 mL Intravenous Q12H   acetaminophen **OR** acetaminophen, ALPRAZolam, bisacodyl, HYDROmorphone (DILAUDID) injection, HYDROmorphone, ipratropium-albuterol, ondansetron **OR** ondansetron (ZOFRAN) IV  Assessment/ Plan:  78 y.o. 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  Acute kidney injury secondary to ATN from sepsis and dehydration.  Urine cultures growing E. coli. - No updated labs available today. -  2.5 L urine output - Continue to avoid nephrotoxic agents.  - Will continue to  improve with treatment of underlying cause.  2.  Left upper pole renal mass consistent with renal cell carcinoma - Will follow-up with urology as outpatient  3.  Hyperkalemia -  Continue to follow low potassium diet with Nepro.    4.  Acute metabolic acidosis. Continue  oral sodium bicarbonate supplementation.  5.  Generalized edema, hypoalbuminemia Received IV albumin 12.5g twice daily.     LOS: 7 [facility] 4/16/202512:18 PM  [facility] [address] [place], [place] [phone]

## 2024-03-17 DIAGNOSIS — K8309 Other cholangitis: Secondary | ICD-10-CM | POA: Diagnosis not present

## 2024-03-17 LAB — COMPREHENSIVE METABOLIC PANEL WITH GFR
ALT: 121 U/L — ABNORMAL HIGH (ref 0–44)
AST: 235 U/L — ABNORMAL HIGH (ref 15–41)
Albumin: 2 g/dL — ABNORMAL LOW (ref 3.5–5.0)
Alkaline Phosphatase: 635 U/L — ABNORMAL HIGH (ref 38–126)
Anion gap: 6 (ref 5–15)
BUN: 33 mg/dL — ABNORMAL HIGH (ref 8–23)
CO2: 26 mmol/L (ref 22–32)
Calcium: 7.4 mg/dL — ABNORMAL LOW (ref 8.9–10.3)
Chloride: 105 mmol/L (ref 98–111)
Creatinine, Ser: 2.14 mg/dL — ABNORMAL HIGH (ref 0.44–1.00)
GFR, Estimated: 23 mL/min — ABNORMAL LOW (ref >60)
Glucose, Bld: 98 mg/dL (ref 70–99)
Potassium: 4.7 mmol/L (ref 3.5–5.1)
Sodium: 137 mmol/L (ref 135–145)
Total Bilirubin: 3.1 mg/dL — ABNORMAL HIGH (ref 0.0–1.2)
Total Protein: 6.2 g/dL — ABNORMAL LOW (ref 6.5–8.1)

## 2024-03-17 LAB — CBC
HCT: 25.4 % — ABNORMAL LOW (ref 36.0–46.0)
Hemoglobin: 8.4 g/dL — ABNORMAL LOW (ref 12.0–15.0)
MCH: 30.7 pg (ref 26.0–34.0)
MCHC: 33.1 g/dL (ref 30.0–36.0)
MCV: 92.7 fL (ref 80.0–100.0)
Platelets: 202 10*3/uL (ref 150–400)
RBC: 2.74 MIL/uL — ABNORMAL LOW (ref 3.87–5.11)
RDW: 19.6 % — ABNORMAL HIGH (ref 11.5–15.5)
WBC: 7.2 10*3/uL (ref 4.0–10.5)
nRBC: 0.3 % — ABNORMAL HIGH (ref 0.0–0.2)

## 2024-03-17 MED ORDER — MEDIHONEY WOUND/BURN DRESSING EX PSTE
1.0000 | PASTE | Freq: Every day | CUTANEOUS | Status: DC
  Administered 2024-03-17 – 2024-03-18 (×2): 1 via TOPICAL
  Filled 2024-03-17: qty 44

## 2024-03-17 NOTE — Plan of Care (Signed)

## 2024-03-17 NOTE — Progress Notes (Signed)
 PROGRESS NOTE    Stacey [name]  ZOX:[id number] DOB: Jun 13, 1946 DOA: 03/09/2024 PCP: Ignatius Specking, MD    Assessment & Plan:   Principal Problem:   Acute cholangitis Active Problems:   Transaminitis   Obstructive jaundice   AKI (acute kidney injury) (HCC)   Hyponatremia   Hyperkalemia   Pain of metastatic malignancy   Chronic combined systolic and diastolic CHF (congestive heart failure) (HCC)   CAD in native artery   COPD (chronic obstructive pulmonary disease) (HCC)   Pyuria   Primary malignant neoplasm of breast with metastasis (HCC)   Hypertension   Acute on chronic anemia   Duodenal stenosis  Assessment and Plan: Acute cholangitis: w/ obstructive jaundice. S/p ERCP by GI, found to have duodenal ulcer, duodenal and sphincter stenosis.  Stent was placed and the purulent discharge was removed.  Will need to have a repeat ERCP in 2-3 months for stent removal and pt & pt's son verbalized their understanding. Continue on cipro    Transaminits: likely secondary to above. Still elevated but trending down    AKI. Cr is labile. Avoid nephrotoxic meds. 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 Follows with [facility] pain clinic.    Chronic combined CHF: appears euvolemic. Monitor I/Os   Hx of CAD: s/p DES to ramus and RCA. No chest pain currently    COPD: w/o exacerbation. Bronchodilators prn   UTI: completed abx course. Urine cx grew e. coli  Primary malignant neoplasm of breast: w/ mets. Management per onco outpatient    HTN: WNL currently. Will continue to monitor    Normocytic anemia: s/p 1 unit of pRBCs transfused so far. Will transfuse if Hb < 7.0    Morbid obesity: BMI 38.2. Complicates overall care & prognosis         DVT prophylaxis:  SCDs Code Status: DNR Family Communication: discussed pt's care w/ pt's son and answered his questions  Disposition Plan: likely d/c back home   Status is: Inpatient Remains inpatient appropriate because: likely d/c back home tomorrow     Level of care: Progressive Consultants:    Procedures:   Antimicrobials:   Subjective: Pt c/o fatigue   Objective: Vitals:   03/17/24 0300 03/17/24 0434 03/17/24 0500 03/17/24 0750  BP:  (!) 127/95  119/65  Pulse: 93 96  94  Resp: 17 14  14   Temp:  98.4 F (36.9 C)  98.4 F (36.9 C)  TempSrc:  Oral  Oral  SpO2: 100% 100%  100%  Weight:   90.4 kg   Height:        Intake/Output Summary (Last 24 hours) at 03/17/2024 0849 Last data filed at 03/17/2024 0434 Gross per 24 hour  Intake 480 ml  Output 1650 ml  Net -1170 ml   Filed Weights   03/15/24 0525 03/16/24 0500 03/17/24 0500  Weight: 87.7 kg 88.8 kg 90.4 kg    Examination:  General exam: appears comfortable  Respiratory system: diminished breath sounds b/l  Cardiovascular system: S1/S2+. No rubs or gallops   Gastrointestinal system: abd is soft, NT, obese & hypoactive bowel sounds  Central nervous system: alert & oriented.  Psychiatry: Judgement and insight appears normal. Appropriate mood and affect     Data Reviewed: I have personally  reviewed following labs and imaging studies  CBC: Recent Labs  Lab 03/13/24 0908 03/14/24 0337 03/15/24 0425 03/15/24 1835 03/16/24 1230 03/17/24 0457  WBC 8.6 8.9 7.4  --  7.2 7.2  HGB 7.6* 7.4* 6.7* 8.8* 8.7* 8.4*  HCT 22.8* 23.0* 20.1* 26.4* 27.1* 25.4*  MCV 93.8 96.2 92.2  --  94.4 92.7  PLT 198 188 180  --  196 202   Basic Metabolic Panel: Recent Labs  Lab 03/12/24 0349 03/13/24 0555 03/14/24 0337 03/15/24 0425 03/16/24 1230 03/17/24 0457  NA 135 136  --  140 137 137  K 5.1 5.0  --  4.7 3.6 4.7  CL 106 106  --  109 106 105  CO2 17* 20*  --  23 23 26   GLUCOSE 80 102*  --  122* 123* 98  BUN 42* 40*  --  37* 34*  33*  CREATININE 2.42* 2.35* 2.35* 2.28* 2.09* 2.14*  CALCIUM 7.2* 7.4*  --  7.0* 7.0* 7.4*  PHOS  --   --   --  1.9*  --   --    GFR: Estimated Creatinine Clearance: 22.1 mL/min (A) (by C-G formula based on SCr of 2.14 mg/dL (H)). Liver Function Tests: Recent Labs  Lab 03/11/24 0454 03/12/24 0349 03/13/24 0554 03/15/24 0425 03/16/24 1230 03/17/24 0457  AST 576* 473* 417*  --  282* 235*  ALT 176* 172* 157*  --  134* 121*  ALKPHOS 1,045* 891* 795*  --  636* 635*  BILITOT 7.0* 5.9* 5.9*  --  4.1* 3.1*  PROT 6.2* 5.8* 5.9*  --  6.4* 6.2*  ALBUMIN 1.8* 1.7* 2.1* 2.3* 2.2* 2.0*   No results for input(s): "LIPASE", "AMYLASE" in the last 168 hours. No results for input(s): "AMMONIA" in the last 168 hours. Coagulation Profile: No results for input(s): "INR", "PROTIME" in the last 168 hours. Cardiac Enzymes: No results for input(s): "CKTOTAL", "CKMB", "CKMBINDEX", "TROPONINI" in the last 168 hours. BNP (last 3 results) No results for input(s): "PROBNP" in the last 8760 hours. HbA1C: No results for input(s): "HGBA1C" in the last 72 hours. CBG: Recent Labs  Lab 03/12/24 1658  GLUCAP 80   Lipid Profile: No results for input(s): "CHOL", "HDL", "LDLCALC", "TRIG", "CHOLHDL", "LDLDIRECT" in the last 72 hours. Thyroid Function Tests: No results for input(s): "TSH", "T4TOTAL", "FREET4", "T3FREE", "THYROIDAB" in the last 72 hours. Anemia Panel: No results for input(s): "VITAMINB12", "FOLATE", "FERRITIN", "TIBC", "IRON", "RETICCTPCT" in the last 72 hours.  Sepsis Labs: No results for input(s): "PROCALCITON", "LATICACIDVEN" in the last 168 hours.  Recent Results (from the past 240 hours)  Urine Culture     Status: Abnormal   Collection Time: 03/08/24  7:43 PM   Specimen: Urine, Clean Catch  Result Value Ref Range Status   Specimen Description   Final    URINE, CLEAN CATCH Performed at [facility], [address], [place], [place] [address]    Special Requests   Final     NONE Performed at [facility], [address], [place], [place] [address]    Culture >=100,000 COLONIES/mL ESCHERICHIA COLI (A)  Final   Report Status 03/11/2024 FINAL  Final   Organism ID, Bacteria ESCHERICHIA COLI (A)  Final      Susceptibility   Escherichia coli - MIC*    AMPICILLIN <=2 SENSITIVE Sensitive     CEFAZOLIN <=4 SENSITIVE Sensitive     CEFEPIME <=0.12 SENSITIVE Sensitive     CEFTRIAXONE <=0.25 SENSITIVE Sensitive     CIPROFLOXACIN <=0.25  SENSITIVE Sensitive     GENTAMICIN <=1 SENSITIVE Sensitive     IMIPENEM <=0.25 SENSITIVE Sensitive     NITROFURANTOIN <=16 SENSITIVE Sensitive     TRIMETH/SULFA <=20 SENSITIVE Sensitive     AMPICILLIN/SULBACTAM <=2 SENSITIVE Sensitive     PIP/TAZO <=4 SENSITIVE Sensitive ug/mL    * >=100,000 COLONIES/mL ESCHERICHIA COLI  Blood culture (routine x 2)     Status: None   Collection Time: 03/08/24 11:53 PM   Specimen: BLOOD  Result Value Ref Range Status   Specimen Description BLOOD BLOOD LEFT HAND  Final   Special Requests   Final    BOTTLES DRAWN AEROBIC AND ANAEROBIC Blood Culture adequate volume   Culture   Final    NO GROWTH 5 DAYS Performed at [facility], [address], [place], [place] [address]    Report Status 03/13/2024 FINAL  Final  Blood culture (routine x 2)     Status: None   Collection Time: 03/08/24 11:55 PM   Specimen: BLOOD  Result Value Ref Range Status   Specimen Description BLOOD BLOOD LEFT HAND  Final   Special Requests   Final    BOTTLES DRAWN AEROBIC ONLY Blood Culture adequate volume   Culture   Final    NO GROWTH 5 DAYS Performed at [facility], [address] [place], [place] [address]    Report Status 03/13/2024 FINAL  Final         Radiology Studies: No results found.      Scheduled Meds:  bisacodyl  10 mg Rectal Once   ciprofloxacin  500 mg Oral Q breakfast   Fe Fum-Vit C-Vit B12-FA  1 capsule Oral BID   feeding supplement (NEPRO CARB STEADY)  237 mL Oral TID BM   metoprolol  succinate  75 mg Oral Daily   pantoprazole (PROTONIX) IV  40 mg Intravenous Q12H   sodium bicarbonate  1,300 mg Oral BID   sodium chloride flush  3 mL Intravenous Q12H   Continuous Infusions:   LOS: 8 days       Alphonsus Jeans, MD [facility] Pager [phone]  If 7PM-7AM, please contact night-coverage [email or web address]  03/17/2024, 8:49 AM

## 2024-03-17 NOTE — Consult Note (Signed)
 WOC Nurse Consult Note: Reason for Consult: sacral wound  Wound type: Deep Tissue Pressure Injury that is evolving with necrotic tissue developing  Pressure Injury POA: no, charted as Stage 2 on admission, has progressed to DTPI with necrotic tissue  Measurement: see nursing flowsheet  Wound bed: lateral  edges remain purple maroon with lifting dermis, center 50% pink 50% brown necrotic   Drainage (amount, consistency, odor) see nursing flowsheet  Periwound:peeling epithelium  Dressing procedure/placement/frequency: Cleanse sacral wound with Vashe wound cleanser (do not rinse and allow to air dry).  Apply Medihoney to wound bed daily, cover with dry gauze and silicone foam.   Patient would benefit from a low air loss mattress for pressure redistribution and moisture management.   POC discussed with bedside nurse. WOC team will follow every 7 to 10 days to assess area and change POC as needed.   Thank you,    Ronni Colace MSN, RN-BC, [employer] [phone]

## 2024-03-17 NOTE — Plan of Care (Signed)
  Problem: Education: °Goal: Knowledge of General Education information will improve °Description: Including pain rating scale, medication(s)/side effects and non-pharmacologic comfort measures °Outcome: Progressing °  °Problem: Clinical Measurements: °Goal: Will remain free from infection °Outcome: Progressing °Goal: Diagnostic test results will improve °Outcome: Progressing °  °Problem: Nutrition: °Goal: Adequate nutrition will be maintained °Outcome: Progressing °  °

## 2024-03-17 NOTE — Progress Notes (Signed)
 [facility], [place] 03/17/24  Subjective:   Hospital day # 8  Patient remains in good spirits.  Tolerating small meals Room air Lower extremity edema remains    Renal: 04/16 0701 - 04/17 0700 In: 480 [P.O.:480] Out: 1650 [Urine:1650] Lab Results  Component Value Date   CREATININE 2.14 (H) 03/17/2024   CREATININE 2.09 (H) 03/16/2024   CREATININE 2.28 (H) 03/15/2024     Objective:  Vital signs in last 24 hours:  Temp:  [98.1 F (36.7 C)-98.6 F (37 C)] 98.3 F (36.8 C) (04/17 1112) Pulse Rate:  [85-116] 99 (04/17 1112) Resp:  [11-18] 18 (04/17 1112) BP: (116-153)/(65-95) 116/87 (04/17 1112) SpO2:  [100 %] 100 % (04/17 1112) Weight:  [90.4 kg] 90.4 kg (04/17 0500)  Weight change: 1.599 kg Filed Weights   03/15/24 0525 03/16/24 0500 03/17/24 0500  Weight: 87.7 kg 88.8 kg 90.4 kg    Intake/Output:    Intake/Output Summary (Last 24 hours) at 03/17/2024 1332 Last data filed at 03/17/2024 0900 Gross per 24 hour  Intake 600 ml  Output 1650 ml  Net -1050 ml     Physical Exam: General: No acute distress, laying in the bed  HEENT Moist oral mucous membranes  Pulm/lungs Normal breathing effort  CVS/Heart Tachycardic, irregular  Abdomen:  Soft, nontender, nondistended  Extremities: + dependent edema  Neurologic: Able to follow simple commands  Skin: No acute rashes, jaundice noted          Basic Metabolic Panel:  Recent Labs  Lab 03/12/24 0349 03/13/24 0555 03/14/24 0337 03/15/24 0425 03/16/24 1230 03/17/24 0457  NA 135 136  --  140 137 137  K 5.1 5.0  --  4.7 3.6 4.7  CL 106 106  --  109 106 105  CO2 17* 20*  --  23 23 26   GLUCOSE 80 102*  --  122* 123* 98  BUN 42* 40*  --  37* 34* 33*  CREATININE 2.42* 2.35* 2.35* 2.28* 2.09* 2.14*  CALCIUM 7.2* 7.4*  --  7.0* 7.0* 7.4*  PHOS  --   --   --  1.9*  --   --      CBC: Recent Labs  Lab 03/13/24 0908 03/14/24 0337 03/15/24 0425 03/15/24 1835 03/16/24 1230  03/17/24 0457  WBC 8.6 8.9 7.4  --  7.2 7.2  HGB 7.6* 7.4* 6.7* 8.8* 8.7* 8.4*  HCT 22.8* 23.0* 20.1* 26.4* 27.1* 25.4*  MCV 93.8 96.2 92.2  --  94.4 92.7  PLT 198 188 180  --  196 202      Lab Results  Component Value Date   HEPBSAG NON REACTIVE 03/08/2024   HEPBIGM NON REACTIVE 03/08/2024      Microbiology:  Recent Results (from the past 240 hours)  Urine Culture     Status: Abnormal   Collection Time: 03/08/24  7:43 PM   Specimen: Urine, Clean Catch  Result Value Ref Range Status   Specimen Description   Final    URINE, CLEAN CATCH Performed at [facility], [address], [place], [place] [address]    Special Requests   Final    NONE Performed at [facility], [address] [place], [place] [address]    Culture >=100,000 COLONIES/mL ESCHERICHIA COLI (A)  Final   Report Status 03/11/2024 FINAL  Final   Organism ID, Bacteria ESCHERICHIA COLI (A)  Final      Susceptibility   Escherichia coli - MIC*    AMPICILLIN <=2 SENSITIVE Sensitive  CEFAZOLIN <=4 SENSITIVE Sensitive     CEFEPIME <=0.12 SENSITIVE Sensitive     CEFTRIAXONE <=0.25 SENSITIVE Sensitive     CIPROFLOXACIN <=0.25 SENSITIVE Sensitive     GENTAMICIN <=1 SENSITIVE Sensitive     IMIPENEM <=0.25 SENSITIVE Sensitive     NITROFURANTOIN <=16 SENSITIVE Sensitive     TRIMETH/SULFA <=20 SENSITIVE Sensitive     AMPICILLIN/SULBACTAM <=2 SENSITIVE Sensitive     PIP/TAZO <=4 SENSITIVE Sensitive ug/mL    * >=100,000 COLONIES/mL ESCHERICHIA COLI  Blood culture (routine x 2)     Status: None   Collection Time: 03/08/24 11:53 PM   Specimen: BLOOD  Result Value Ref Range Status   Specimen Description BLOOD BLOOD LEFT HAND  Final   Special Requests   Final    BOTTLES DRAWN AEROBIC AND ANAEROBIC Blood Culture adequate volume   Culture   Final    NO GROWTH 5 DAYS Performed at [facility], [address], [place], [place] [address]    Report Status 03/13/2024 FINAL  Final  Blood culture (routine x 2)      Status: None   Collection Time: 03/08/24 11:55 PM   Specimen: BLOOD  Result Value Ref Range Status   Specimen Description BLOOD BLOOD LEFT HAND  Final   Special Requests   Final    BOTTLES DRAWN AEROBIC ONLY Blood Culture adequate volume   Culture   Final    NO GROWTH 5 DAYS Performed at [facility], [address] [place], [place] [address]    Report Status 03/13/2024 FINAL  Final    Coagulation Studies: No results for input(s): "LABPROT", "INR" in the last 72 hours.  Urinalysis: No results for input(s): "COLORURINE", "LABSPEC", "PHURINE", "GLUCOSEU", "HGBUR", "BILIRUBINUR", "KETONESUR", "PROTEINUR", "UROBILINOGEN", "NITRITE", "LEUKOCYTESUR" in the last 72 hours.  Invalid input(s): "APPERANCEUR"    Imaging: No results found.    Medications:      bisacodyl  10 mg Rectal Once   ciprofloxacin  500 mg Oral Q breakfast   Fe Fum-Vit C-Vit B12-FA  1 capsule Oral BID   feeding supplement (NEPRO CARB STEADY)  237 mL Oral TID BM   metoprolol succinate  75 mg Oral Daily   pantoprazole (PROTONIX) IV  40 mg Intravenous Q12H   sodium bicarbonate  1,300 mg Oral BID   sodium chloride flush  3 mL Intravenous Q12H   acetaminophen **OR** acetaminophen, ALPRAZolam, bisacodyl, HYDROmorphone (DILAUDID) injection, HYDROmorphone, ipratropium-albuterol, ondansetron **OR** ondansetron (ZOFRAN) IV  Assessment/ Plan:  78 y.o. 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  Acute kidney injury secondary to ATN from sepsis and dehydration.  Urine cultures growing E. coli. - Creatinine appears to have stabilized - Will continue to monitor and patient will need to follow up in our office in 1 month, after seeing GI, for continued monitoring.  - Continue to avoid nephrotoxic agents.  -Patient is cleared to  discharge from renal stance.   2.  Left upper pole renal mass consistent with renal cell carcinoma - Follow-up with urology as outpatient  3.  Hyperkalemia -  Continue to follow low potassium diet with Nepro.    4.  Acute metabolic acidosis. Continue oral sodium bicarbonate supplementation.  5.  Generalized edema, hypoalbuminemia Received IV albumin during this admission.      LOS: 8 Myasia Sinatra 4/17/20251:32 PM  [facility]  [place], [place] [phone]

## 2024-03-18 DIAGNOSIS — K8309 Other cholangitis: Secondary | ICD-10-CM | POA: Diagnosis not present

## 2024-03-18 LAB — CBC
HCT: 23.4 % — ABNORMAL LOW (ref 36.0–46.0)
Hemoglobin: 7.8 g/dL — ABNORMAL LOW (ref 12.0–15.0)
MCH: 31.2 pg (ref 26.0–34.0)
MCHC: 33.3 g/dL (ref 30.0–36.0)
MCV: 93.6 fL (ref 80.0–100.0)
Platelets: 181 10*3/uL (ref 150–400)
RBC: 2.5 MIL/uL — ABNORMAL LOW (ref 3.87–5.11)
RDW: 19.5 % — ABNORMAL HIGH (ref 11.5–15.5)
WBC: 6.1 10*3/uL (ref 4.0–10.5)
nRBC: 0 % (ref 0.0–0.2)

## 2024-03-18 LAB — COMPREHENSIVE METABOLIC PANEL WITH GFR
ALT: 102 U/L — ABNORMAL HIGH (ref 0–44)
AST: 171 U/L — ABNORMAL HIGH (ref 15–41)
Albumin: 2 g/dL — ABNORMAL LOW (ref 3.5–5.0)
Alkaline Phosphatase: 581 U/L — ABNORMAL HIGH (ref 38–126)
Anion gap: 7 (ref 5–15)
BUN: 30 mg/dL — ABNORMAL HIGH (ref 8–23)
CO2: 24 mmol/L (ref 22–32)
Calcium: 7.4 mg/dL — ABNORMAL LOW (ref 8.9–10.3)
Chloride: 106 mmol/L (ref 98–111)
Creatinine, Ser: 1.89 mg/dL — ABNORMAL HIGH (ref 0.44–1.00)
GFR, Estimated: 27 mL/min — ABNORMAL LOW (ref >60)
Glucose, Bld: 130 mg/dL — ABNORMAL HIGH (ref 70–99)
Potassium: 3.8 mmol/L (ref 3.5–5.1)
Sodium: 137 mmol/L (ref 135–145)
Total Bilirubin: 2.5 mg/dL — ABNORMAL HIGH (ref 0.0–1.2)
Total Protein: 5.6 g/dL — ABNORMAL LOW (ref 6.5–8.1)

## 2024-03-18 MED ORDER — MEDIHONEY WOUND/BURN DRESSING EX PSTE: 1.0000 | PASTE | Freq: Every day | CUTANEOUS | 0 refills | Status: DC

## 2024-03-18 MED ORDER — ENSURE ENLIVE PO LIQD
237.0000 mL | Freq: Three times a day (TID) | ORAL | Status: DC
  Administered 2024-03-18: 237 mL via ORAL

## 2024-03-18 MED ORDER — PANTOPRAZOLE SODIUM 40 MG PO TBEC: 40.0000 mg | DELAYED_RELEASE_TABLET | Freq: Two times a day (BID) | ORAL | 0 refills | Status: DC

## 2024-03-18 MED ORDER — METOPROLOL SUCCINATE ER 25 MG PO TB24
75.0000 mg | ORAL_TABLET | Freq: Every day | ORAL | 0 refills | Status: DC
Start: 2024-03-19 — End: 2024-06-24

## 2024-03-18 MED ORDER — CIPROFLOXACIN HCL 500 MG PO TABS: 500.0000 mg | ORAL_TABLET | Freq: Every day | ORAL | 0 refills | Status: AC

## 2024-03-18 NOTE — Progress Notes (Signed)
 [facility], [place] 03/18/24  Subjective:   Hospital day # 9  Patient seen sitting up in bed No family present Tolerating small meals  Adequate urine output   Renal: 04/17 0701 - 04/18 0700 In: 483 [P.O.:480; I.V.:3] Out: 2100 [Urine:2100] Lab Results  Component Value Date   CREATININE 1.89 (H) 03/18/2024   CREATININE 2.14 (H) 03/17/2024   CREATININE 2.09 (H) 03/16/2024     Objective:  Vital signs in last 24 hours:  Temp:  [97.6 F (36.4 C)-98.6 F (37 C)] 98.6 F (37 C) (04/18 1610) Pulse Rate:  [82-115] 115 (04/18 0833) Resp:  [13-21] 21 (04/18 0833) BP: (106-160)/(60-92) 127/65 (04/18 0833) SpO2:  [100 %] 100 % (04/18 0833) Weight:  [86.4 kg] 86.4 kg (04/18 0533)  Weight change: -4 kg Filed Weights   03/16/24 0500 03/17/24 0500 03/18/24 0533  Weight: 88.8 kg 90.4 kg 86.4 kg    Intake/Output:    Intake/Output Summary (Last 24 hours) at 03/18/2024 1203 Last data filed at 03/18/2024 1034 Gross per 24 hour  Intake 363 ml  Output 2100 ml  Net -1737 ml     Physical Exam: General: No acute distress, laying in the bed  HEENT Moist oral mucous membranes  Pulm/lungs Normal breathing effort  CVS/Heart Tachycardic, irregular  Abdomen:  Soft, nontender, nondistended  Extremities: + dependent edema  Neurologic: Able to follow simple commands  Skin: No acute rashes, jaundice noted          Basic Metabolic Panel:  Recent Labs  Lab 03/13/24 0555 03/14/24 0337 03/15/24 0425 03/16/24 1230 03/17/24 0457 03/18/24 0513  NA 136  --  140 137 137 137  K 5.0  --  4.7 3.6 4.7 3.8  CL 106  --  109 106 105 106  CO2 20*  --  23 23 26 24   GLUCOSE 102*  --  122* 123* 98 130*  BUN 40*  --  37* 34* 33* 30*  CREATININE 2.35* 2.35* 2.28* 2.09* 2.14* 1.89*  CALCIUM  7.4*  --  7.0* 7.0* 7.4* 7.4*  PHOS  --   --  1.9*  --   --   --      CBC: Recent Labs  Lab 03/14/24 0337 03/15/24 0425 03/15/24 1835 03/16/24 1230 03/17/24 0457  03/18/24 0513  WBC 8.9 7.4  --  7.2 7.2 6.1  HGB 7.4* 6.7* 8.8* 8.7* 8.4* 7.8*  HCT 23.0* 20.1* 26.4* 27.1* 25.4* 23.4*  MCV 96.2 92.2  --  94.4 92.7 93.6  PLT 188 180  --  196 202 181      Lab Results  Component Value Date   HEPBSAG NON REACTIVE 03/08/2024   HEPBIGM NON REACTIVE 03/08/2024      Microbiology:  Recent Results (from the past 240 hours)  Urine Culture     Status: Abnormal   Collection Time: 03/08/24  7:43 PM   Specimen: Urine, Clean Catch  Result Value Ref Range Status   Specimen Description   Final    URINE, CLEAN CATCH Performed at [facility], [address], [place], [place] [address]    Special Requests   Final    NONE Performed at [facility], [address], [place], [place] [address]    Culture >=100,000 COLONIES/mL ESCHERICHIA COLI (A)  Final   Report Status 03/11/2024 FINAL  Final   Organism ID, Bacteria ESCHERICHIA COLI (A)  Final      Susceptibility   Escherichia coli - MIC*    AMPICILLIN <=2 SENSITIVE  Sensitive     CEFAZOLIN  <=4 SENSITIVE Sensitive     CEFEPIME <=0.12 SENSITIVE Sensitive     CEFTRIAXONE  <=0.25 SENSITIVE Sensitive     CIPROFLOXACIN  <=0.25 SENSITIVE Sensitive     GENTAMICIN <=1 SENSITIVE Sensitive     IMIPENEM <=0.25 SENSITIVE Sensitive     NITROFURANTOIN <=16 SENSITIVE Sensitive     TRIMETH/SULFA <=20 SENSITIVE Sensitive     AMPICILLIN/SULBACTAM <=2 SENSITIVE Sensitive     PIP/TAZO <=4 SENSITIVE Sensitive ug/mL    * >=100,000 COLONIES/mL ESCHERICHIA COLI  Blood culture (routine x 2)     Status: None   Collection Time: 03/08/24 11:53 PM   Specimen: BLOOD  Result Value Ref Range Status   Specimen Description BLOOD BLOOD LEFT HAND  Final   Special Requests   Final    BOTTLES DRAWN AEROBIC AND ANAEROBIC Blood Culture adequate volume   Culture   Final    NO GROWTH 5 DAYS Performed at [facility], [address] [place], [place] [address]    Report Status 03/13/2024 FINAL  Final  Blood culture (routine x 2)      Status: None   Collection Time: 03/08/24 11:55 PM   Specimen: BLOOD  Result Value Ref Range Status   Specimen Description BLOOD BLOOD LEFT HAND  Final   Special Requests   Final    BOTTLES DRAWN AEROBIC ONLY Blood Culture adequate volume   Culture   Final    NO GROWTH 5 DAYS Performed at [facility], [address] [place], [place] [address]    Report Status 03/13/2024 FINAL  Final    Coagulation Studies: No results for input(s): "LABPROT", "INR" in the last 72 hours.  Urinalysis: No results for input(s): "COLORURINE", "LABSPEC", "PHURINE", "GLUCOSEU", "HGBUR", "BILIRUBINUR", "KETONESUR", "PROTEINUR", "UROBILINOGEN", "NITRITE", "LEUKOCYTESUR" in the last 72 hours.  Invalid input(s): "APPERANCEUR"    Imaging: No results found.    Medications:      bisacodyl   10 mg Rectal Once   ciprofloxacin   500 mg Oral Q breakfast   Fe Fum-Vit C-Vit B12-FA  1 capsule Oral BID   feeding supplement  237 mL Oral TID BM   leptospermum manuka honey  1 Application Topical Daily   metoprolol  succinate  75 mg Oral Daily   pantoprazole  (PROTONIX ) IV  40 mg Intravenous Q12H   sodium bicarbonate   1,300 mg Oral BID   sodium chloride  flush  3 mL Intravenous Q12H   acetaminophen  **OR** acetaminophen , ALPRAZolam , bisacodyl , HYDROmorphone  (DILAUDID ) injection, HYDROmorphone , ipratropium-albuterol , ondansetron  **OR** ondansetron  (ZOFRAN ) IV  Assessment/ Plan:  78 y.o. 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  Acute kidney injury secondary to ATN from sepsis and dehydration.  Urine cultures growing E. coli. - Creatinine improved today, 1.89 - Continue to avoid nephrotoxic agents.  - Will continue to monitor and patient will need to follow up in our office in 1 month, after GI, for continued monitoring.     2.  Left upper pole renal mass consistent with renal cell carcinoma - Follow-up with urology as outpatient  3.  Hyperkalemia - Corrected with low potassium diet with Nepro.    4.  Acute metabolic acidosis. Continue oral sodium bicarbonate  supplementation.  5.  Generalized edema, hypoalbuminemia Received IV albumin  during this admission.      LOS: 9 Norwood Quezada 4/18/202512:03 PM  [facility]  [place], [place] [phone]

## 2024-03-18 NOTE — Progress Notes (Signed)
 Wound care complete

## 2024-03-18 NOTE — Discharge Summary (Signed)
 Physician Discharge Summary  Stacey  NANCEE [name] ZOX:[id number] DOB: [birth year] DOA: 03/09/2024  PCP: [name], MD  Admit date: 03/09/2024 Discharge date: 03/18/2024  Admitted From: home Disposition:  home w/ home health   Recommendations for Outpatient Follow-up:  Follow up with PCP in 1 week F/u w/ GI, Dr. Ole Berkeley, in 2 months F/u w/ nephro, Dr. Zelda Hickman, in 1 month F/u w/ onco in 1-2 weeks F/u w/ urology in 1-2 weeks   Home Health: yes Equipment/Devices:  Discharge Condition: stable  CODE STATUS: full  Diet recommendation: Dysphagia III diet   Brief/Interim Summary: HPI was taken from Dr. Guss Legacy: Stacey [name] is a 78 y.o. 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 on 03/08/2024 due to weakness/lethargy with nausea/vomiting.   History obtained through chart review as patient remains quite sleepy postop. Per chart review, presented to the ED due to several day history of jaundice and subsequent development of confusion.  She had begun to experience nausea, vomiting and bodyaches.   Per chart review, patient has been experiencing some short-term memory loss at baseline and is essentially bedbound for the last year and a half.   Hospital course: On arrival to the ED yesterday, patient was normotensive at 134/78 with heart rate of 75.  She was saturating at 90% on room air.  She was afebrile at 98.  Initial workup notable for WBC of 17.8, hemoglobin of 9.0, sodium 128, BUN 38, creat 1.91, alkaline phosphatase 1489, AST 550, ALT 171, GFR 27.  Urinalysis with large hematuria, leukocytes, many bacteria.  CT of the head with no intracranial abnormality.  CT chest/abdomen/pelvis with no acute abnormality.  MRCP with severe intra  and extrahepatic biliary ductal dilation with sharp narrowing common bile duct in the pancreatic head.  Patient was started on Zosyn , IV fluids.  GI consulted and recommendation was made for transfer for ERCP.  Patient was transferred to [facility].  On arrival to [facility], patient's blood pressure was on the lower end of normal at 105/59 with heart rate of 90.  She was saturating at 92% on room air.  She was afebrile at 97.6.    Discharge Diagnoses:  Principal Problem:   Acute cholangitis Active Problems:   Transaminitis   Obstructive jaundice   AKI (acute kidney injury) (HCC)   Hyponatremia   Hyperkalemia   Pain of metastatic malignancy   Chronic combined systolic and diastolic CHF (congestive heart failure) (HCC)   CAD in native artery   COPD (chronic obstructive pulmonary disease) (HCC)   Pyuria   Primary malignant neoplasm of breast with metastasis (HCC)   Hypertension   Acute on chronic anemia   Duodenal stenosis  Acute cholangitis: w/ obstructive jaundice. S/p ERCP by GI, found to have duodenal ulcer, duodenal and sphincter stenosis.  Stent was placed and the purulent discharge was removed.  Will need to have a repeat ERCP in 2-3 months for stent removal and pt & pt's son verbalized their understanding. Continue on cipro  x 7 days total.    Transaminits: likely secondary to above. Still elevated continues to trend down    AKI. Cr is trending down. Avoid nephrotoxic meds. 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 Follows with [facility] pain clinic.    Chronic combined CHF: appears euvolemic. Monitor I/Os    Hx of CAD: s/p DES to ramus and RCA. No chest pain currently    COPD: w/o exacerbation. Bronchodilators prn   UTI: completed  abx course. Urine cx grew e. coli   Primary malignant neoplasm of breast: w/ mets. Management per onco outpatient    HTN: WNL currently. Will continue to monitor    Normocytic anemia: s/p 1 unit of pRBCs transfused so far. Will transfuse if Hb < 7.0    Morbid obesity: BMI 38.2. Complicates overall care & prognosis    Discharge Instructions  Discharge Instructions     Diet - low sodium heart healthy   Complete by: As directed    Dysphagia III diet   Discharge instructions   Complete by: As directed    F/u w/ PCP in 1 week. F/u w/ GI, Dr. Ole Berkeley, in 2 months. F/u w/ nephro, Dr. Zelda Hickman, in 1 month. F/u w/ urology and onco renal cell carcinoma.   Discharge wound care:   Complete by: As directed    Wound care  Daily      Comments: Cleanse sacral wound with Vashe wound cleanser (do not rinse and allow to air dry).  Apply Medihoney to wound bed daily, cover with dry gauze and silicone foam   Increase activity slowly   Complete by: As directed       Allergies as of 03/18/2024       Reactions   Brilinta  [ticagrelor ] Diarrhea, Nausea Only   Nausea and severe diarrhea- general weakness. Patient says does not want to take again   Budesonide  Palpitations   Albuterol  Other (See Comments)   Heart racing        Medication List     STOP taking these medications    lactated ringers  infusion   piperacillin -tazobactam 3.375 GM/50ML IVPB Commonly known as: ZOSYN        TAKE these medications    acetaminophen  325 MG tablet Commonly known as: TYLENOL  Take 975 mg by mouth every 6 (six) hours as needed for mild pain (pain score 1-3).   amLODipine  5 MG tablet Commonly known as: NORVASC  Take 5 mg by mouth daily.   apixaban  5 MG Tabs tablet Commonly known as: ELIQUIS  Take 5 mg by mouth 2 (two) times daily.   atorvastatin  80 MG tablet Commonly known as: LIPITOR  Take 80 mg by mouth daily.   b complex vitamins capsule Take 1 capsule by mouth daily.   carboxymethylcellulose 0.5  % Soln Commonly known as: REFRESH PLUS Place 1 drop into both eyes 3 (three) times daily as needed (Dry Eyes).   ciprofloxacin  500 MG tablet Commonly known as: CIPRO  Take 1 tablet (500 mg total) by mouth daily with breakfast for 4 days. Start taking on: March 19, 2024   clopidogrel  75 MG tablet Commonly known as: PLAVIX  Take 75 mg by mouth daily.   diclofenac  Sodium 1 % Gel Commonly known as: VOLTAREN  Apply topically.   DULoxetine  30 MG capsule Commonly known as: CYMBALTA  Take 30 mg by mouth daily.   ferrous sulfate 325 (65 FE) MG EC tablet Take 325 mg by mouth 3 (three) times daily with meals.   HYDROmorphone  2 MG tablet Commonly known as: DILAUDID  Take 4 mg by  mouth every 4 (four) hours as needed for severe pain (pain score 7-10).   HYDROmorphone  1 MG/ML injection Commonly known as: DILAUDID  Inject 0.5 mLs (0.5 mg total) into the vein every 2 (two) hours as needed for severe pain (pain score 7-10) or moderate pain (pain score 4-6).   leptospermum manuka honey Pste paste Apply 1 Application topically daily.   losartan  100 MG tablet Commonly known as: COZAAR  Take 100 mg by mouth daily.   methocarbamol  500 MG tablet Commonly known as: ROBAXIN  Take 500 mg by mouth every 6 (six) hours as needed for muscle spasms.   metoprolol  succinate 25 MG 24 hr tablet Commonly known as: TOPROL -XL Take 3 tablets (75 mg total) by mouth daily. Start taking on: March 19, 2024 What changed:  medication strength how much to take additional instructions   nitroGLYCERIN  0.4 MG SL tablet Commonly known as: NITROSTAT  Place 0.4 mg under the tongue every 5 (five) minutes as needed for chest pain.   ondansetron  4 MG/2ML Soln injection Commonly known as: ZOFRAN  Inject 2 mLs (4 mg total) into the vein every 6 (six) hours as needed for nausea or vomiting.   pantoprazole  40 MG tablet Commonly known as: Protonix  Take 1 tablet (40 mg total) by mouth 2 (two) times daily.                Discharge Care Instructions  (From admission, onward)           Start     Ordered   03/18/24 0000  Discharge wound care:       Comments: Wound care  Daily      Comments: Cleanse sacral wound with Vashe wound cleanser (do not rinse and allow to air dry).  Apply Medihoney to wound bed daily, cover with dry gauze and silicone foam   03/18/24 1305            Follow-up Information     Marnee Sink, MD Follow up.   Specialty: Gastroenterology Why: F/u in 2 months Contact information: [address] [place]  [place] [address] [phone]         Rica Chalet, MD Follow up.   Specialty: Nephrology Why: F/u in 1 month Contact information: [address] [place] [address] [phone]         [name], MD Follow up.   Specialty: Internal Medicine Why: F/u within 1 week Contact information: [address] [place] [address] [phone]                Allergies  Allergen Reactions   Brilinta  [Ticagrelor ] Diarrhea and Nausea Only    Nausea and severe diarrhea- general weakness. Patient says does not want to take again   Budesonide  Palpitations   Albuterol  Other (See Comments)    Heart racing     Consultations: GI Nephro    Procedures/Studies: US  RENAL Result Date: 03/11/2024 CLINICAL DATA:  Acute kidney injury EXAM: RENAL / URINARY TRACT ULTRASOUND COMPLETE COMPARISON:  CT abdomen pelvis 03/08/2024 FINDINGS: Right Kidney: Renal measurements: 9.5 x 4.7 x 5.3 cm = volume: 124 mL. Mild increased echogenicity the renal cortex without significant thinning. No mass or hydronephrosis visualized. Left Kidney: Renal measurements: 9.9 x 5.2 x 4.5 cm = volume: 121 mL. Mild increased echogenicity of the renal cortex without significant thinning. Solid heterogeneous mass again seen in the upper pole measuring 3.1 x 2.2 x 2.7 cm. A simple cyst seen in the lower pole of the left kidney measuring 1.3 x  1.1 x 1.2 cm not require dedicated  imaging follow-up. Bladder: Appears normal for degree of bladder distention. Other: None. IMPRESSION: 1. No hydronephrosis. 2. Increased echogenicity of the renal cortex consistent with chronic medical renal disease. 3. Solid heterogeneous mass in the upper pole the left kidney again seen, consistent with the renal cell carcinoma. Electronically Signed   By: Elester Grim M.D.   On: 03/11/2024 12:23   DG C-Arm 1-60 Min-No Report Result Date: 03/09/2024 Fluoroscopy was utilized by the requesting physician.  No radiographic interpretation.   MR ABDOMEN MRCP W WO CONTAST Result Date: 03/09/2024 CLINICAL DATA:  Jaundice, metastatic breast cancer EXAM: MRI ABDOMEN WITHOUT AND WITH CONTRAST (INCLUDING MRCP) TECHNIQUE: Multiplanar multisequence MR imaging of the abdomen was performed both before and after the administration of intravenous contrast. Heavily T2-weighted images of the biliary and pancreatic ducts were obtained, and three-dimensional MRCP images were rendered by post processing. CONTRAST:  9mL GADAVIST  GADOBUTROL  1 MMOL/ML IV SOLN COMPARISON:  CT chest abdomen pelvis, 03/08/2024, 01/01/2024 FINDINGS: Examination is unfortunately very limited by breath motion artifact as well as susceptibility and saturation artifact arising from a subcutaneous pain pump in the soft tissues of the right upper quadrant. Lower chest: No acute abnormality. Calcified mass in the right breast (series 5, image 4). Hepatobiliary: No focal liver abnormality is seen. Status post cholecystectomy. Severe intra and extrahepatic biliary ductal dilatation with periportal edema. This appears to be increased when compared to prior contrast enhanced CT dated 01/01/2024. The central common bile duct is very difficult to assess due to artifact and image quality but appears to sharply narrow within the pancreatic head (series 15, image 49). Pancreas: Unremarkable. No pancreatic ductal dilatation or surrounding inflammatory changes. Spleen:  Normal in size without significant abnormality. Adrenals/Urinary Tract: Adrenal glands are unremarkable. Heterogeneously enhancing mass in the peripheral superior pole of the left kidney measuring 2.7 x 2.5 cm (series 15, image 37). Right kidney is normal, without obvious renal calculi, solid lesion, or hydronephrosis. Stomach/Bowel: Stomach is within normal limits. No evidence of bowel wall thickening, distention, or inflammatory changes. Vascular/Lymphatic: Severe aortic atherosclerosis. No enlarged abdominal lymph nodes. Other: No abdominal wall hernia or abnormality. No ascites. Musculoskeletal: No acute or significant osseous findings. IMPRESSION: 1. Examination is unfortunately very limited by breath motion artifact as well as susceptibility and saturation artifact arising from a subcutaneous pain pump in the soft tissues of the right upper quadrant. 2. Within this limitation, severe intra and extrahepatic biliary ductal dilatation with periportal edema. This appears to be increased when compared to prior contrast enhanced CT dated 01/01/2024. The central common bile duct is very difficult to assess due to artifact and image quality but appears to sharply narrow within the pancreatic head. Findings are highly concerning for obstructing calculus or mass; repeat contrast enhanced CT may be helpful to better assess given severe technical limitations of this MR. 3. Heterogeneously enhancing mass in the peripheral superior pole of the left kidney measuring 2.7 x 2.5 cm, consistent with renal cell carcinoma. Aortic Atherosclerosis (ICD10-I70.0). Electronically Signed   By: Fredricka Jenny M.D.   On: 03/09/2024 07:46   MR 3D Recon At Scanner Result Date: 03/09/2024 CLINICAL DATA:  Jaundice, metastatic breast cancer EXAM: MRI ABDOMEN WITHOUT AND WITH CONTRAST (INCLUDING MRCP) TECHNIQUE: Multiplanar multisequence MR imaging of the abdomen was performed both before and after the administration of intravenous contrast.  Heavily T2-weighted images of the biliary and pancreatic ducts were obtained, and three-dimensional MRCP images were rendered by post processing.  CONTRAST:  9mL GADAVIST  GADOBUTROL  1 MMOL/ML IV SOLN COMPARISON:  CT chest abdomen pelvis, 03/08/2024, 01/01/2024 FINDINGS: Examination is unfortunately very limited by breath motion artifact as well as susceptibility and saturation artifact arising from a subcutaneous pain pump in the soft tissues of the right upper quadrant. Lower chest: No acute abnormality. Calcified mass in the right breast (series 5, image 4). Hepatobiliary: No focal liver abnormality is seen. Status post cholecystectomy. Severe intra and extrahepatic biliary ductal dilatation with periportal edema. This appears to be increased when compared to prior contrast enhanced CT dated 01/01/2024. The central common bile duct is very difficult to assess due to artifact and image quality but appears to sharply narrow within the pancreatic head (series 15, image 49). Pancreas: Unremarkable. No pancreatic ductal dilatation or surrounding inflammatory changes. Spleen: Normal in size without significant abnormality. Adrenals/Urinary Tract: Adrenal glands are unremarkable. Heterogeneously enhancing mass in the peripheral superior pole of the left kidney measuring 2.7 x 2.5 cm (series 15, image 37). Right kidney is normal, without obvious renal calculi, solid lesion, or hydronephrosis. Stomach/Bowel: Stomach is within normal limits. No evidence of bowel wall thickening, distention, or inflammatory changes. Vascular/Lymphatic: Severe aortic atherosclerosis. No enlarged abdominal lymph nodes. Other: No abdominal wall hernia or abnormality. No ascites. Musculoskeletal: No acute or significant osseous findings. IMPRESSION: 1. Examination is unfortunately very limited by breath motion artifact as well as susceptibility and saturation artifact arising from a subcutaneous pain pump in the soft tissues of the right upper  quadrant. 2. Within this limitation, severe intra and extrahepatic biliary ductal dilatation with periportal edema. This appears to be increased when compared to prior contrast enhanced CT dated 01/01/2024. The central common bile duct is very difficult to assess due to artifact and image quality but appears to sharply narrow within the pancreatic head. Findings are highly concerning for obstructing calculus or mass; repeat contrast enhanced CT may be helpful to better assess given severe technical limitations of this MR. 3. Heterogeneously enhancing mass in the peripheral superior pole of the left kidney measuring 2.7 x 2.5 cm, consistent with renal cell carcinoma. Aortic Atherosclerosis (ICD10-I70.0). Electronically Signed   By: Fredricka Jenny M.D.   On: 03/09/2024 07:46   CT CHEST ABDOMEN PELVIS WO CONTRAST Result Date: 03/08/2024 CLINICAL DATA:  History of metastatic breast cancer with weakness, body aches, and lethargy associated with nausea and vomiting. * Tracking Code: BO * EXAM: CT CHEST, ABDOMEN AND PELVIS WITHOUT CONTRAST TECHNIQUE: Multidetector CT imaging of the chest, abdomen and pelvis was performed following the standard protocol without IV contrast. RADIATION DOSE REDUCTION: This exam was performed according to the departmental dose-optimization program which includes automated exposure control, adjustment of the mA and/or kV according to patient size and/or use of iterative reconstruction technique. COMPARISON:  CT chest, abdomen, and pelvis dated 01/01/2024 FINDINGS: CT CHEST FINDINGS Cardiovascular: Normal heart size. No significant pericardial fluid/thickening. Great vessels are normal in course and caliber. Coronary artery calcifications. Mediastinum/Nodes: Imaged thyroid  gland without nodules meeting criteria for imaging follow-up by size. Normal esophagus. No pathologically enlarged axillary, supraclavicular, mediastinal, or hilar lymph nodes. Lungs/Pleura: The central airways are patent.  Adherent secretions within the trachea. Unchanged irregular right apical nodule measuring 7 x 6 mm (3:34). No new or enlarging pulmonary nodules. No pneumothorax. No pleural effusion. Musculoskeletal: No acute or abnormal lytic or blastic osseous lesions. Multilevel degenerative changes of the thoracic spine. Unchanged calcified mass in the right breast. Punctate foci of gas within the right anterior chest appears anti dependent  within small vessels in the right subclavian vein. CT ABDOMEN PELVIS FINDINGS Hepatobiliary: No focal hepatic lesions. Mild intrahepatic bile duct dilation, which may be related to cholecystectomy. Trace pneumobilia at the hepatic hilum. Pancreas: No focal lesions or main ductal dilation. Spleen: Normal in size without focal abnormality. Adrenals/Urinary Tract: No adrenal nodules. No calculi or hydronephrosis. Left upper pole renal mass, better evaluated on prior contrast-enhanced examination. No focal bladder wall thickening. Stomach/Bowel: Normal appearance of the stomach. No evidence of bowel wall thickening, distention, or inflammatory changes. Large volume stool within the rectum. Moderate volume stool within the descending and sigmoid colon. Appendix is not discretely seen. Vascular/Lymphatic: Aortic atherosclerosis. No enlarged abdominal or pelvic lymph nodes. Reproductive: No adnexal masses. Other: No free fluid, fluid collection, or free air. Calcified nodule along the left anterior peritoneum, likely sequela of prior infection/inflammation. Musculoskeletal: Similar sclerotic lesion at L1-2 with L1 compression fracture and retropulsion of fracture fragments. Unchanged cortical irregularity of left inferior pubic ramus. Multilevel degenerative changes of the lumbar spine. Again seen is infusion pump within the right anterior abdominal wall. IMPRESSION: 1. No acute abnormality in the chest, abdomen, or pelvis. 2. Unchanged calcified right breast mass and irregular right apical nodule.  No new or enlarging pulmonary nodules. 3. Similar sclerotic lesion at L1-2 with L1 compression fracture and retropulsion of fracture fragments. 4. Large volume stool within the rectum. Moderate volume stool within the descending and sigmoid colon. Recommend correlation with constipation. 5. Left upper pole renal mass is better evaluated on prior contrast-enhanced examination. 6. Aortic Atherosclerosis (ICD10-I70.0). Coronary artery calcifications. Assessment for potential risk factor modification, dietary therapy or pharmacologic therapy may be warranted, if clinically indicated. Electronically Signed   By: Limin  Xu M.D.   On: 03/08/2024 20:56   CT Head Wo Contrast Result Date: 03/08/2024 CLINICAL DATA:  Mental status change, unknown cause EXAM: CT HEAD WITHOUT CONTRAST TECHNIQUE: Contiguous axial images were obtained from the base of the skull through the vertex without intravenous contrast. RADIATION DOSE REDUCTION: This exam was performed according to the departmental dose-optimization program which includes automated exposure control, adjustment of the mA and/or kV according to patient size and/or use of iterative reconstruction technique. COMPARISON:  CT head 05/01/2021 FINDINGS: Brain: No evidence of large-territorial acute infarction. No parenchymal hemorrhage. No mass lesion. No extra-axial collection. No mass effect or midline shift. No hydrocephalus. Basilar cisterns are patent. Vascular: No hyperdense vessel. Atherosclerotic calcifications are present within the cavernous internal carotid arteries. Skull: No acute fracture or focal lesion. Sinuses/Orbits: Paranasal sinuses and mastoid air cells are clear. The orbits are unremarkable. Other: None. IMPRESSION: No acute intracranial abnormality. Electronically Signed   By: Morgane  Naveau M.D.   On: 03/08/2024 20:09   US  Abdomen Limited Result Date: 03/08/2024 CLINICAL DATA:  Jaundice EXAM: ULTRASOUND ABDOMEN LIMITED RIGHT UPPER QUADRANT COMPARISON:  CT  abdomen pelvis 03/08/2024, CT abdomen pelvis 01/01/2024 FINDINGS: Gallbladder: Status post cholecystectomy. Common bile duct: Diameter: Thickened measuring up to 14 mm with associated echogenic avascular material within the common bile duct. Liver: No focal lesion identified. Within normal limits in parenchymal echogenicity. Portal vein is patent on color Doppler imaging with normal direction of blood flow towards the liver. Pneumobilia noted. Intrahepatic ducts measure at the upper limits of normal. Other: None. IMPRESSION: 1. Thickened common bile duct with echogenic debris within the lumen. Intrahepatic ducts measure at the upper limits of normal. Finding could be sludge versus less likely a mass (given no vasculature identified). When the patient is clinically stable  and able to follow directions and hold their breath (preferably as an outpatient) further evaluation with dedicated abdominal MRI should be considered. 2. Status post cholecystectomy. Electronically Signed   By: Morgane  Naveau M.D.   On: 03/08/2024 20:07   (Echo, Carotid, EGD, Colonoscopy, ERCP)    Subjective: Pt c/o malaise    Discharge Exam: Vitals:   03/18/24 0833 03/18/24 1308  BP: 127/65   Pulse: (!) 115   Resp: (!) 21   Temp: 98.6 F (37 C) 98.4 F (36.9 C)  SpO2: 100%    Vitals:   03/18/24 0500 03/18/24 0533 03/18/24 0833 03/18/24 1308  BP: (!) 140/71  127/65   Pulse: 82  (!) 115   Resp: 20  (!) 21   Temp: 97.6 F (36.4 C)  98.6 F (37 C) 98.4 F (36.9 C)  TempSrc: Oral  Oral   SpO2: 100%  100%   Weight:  86.4 kg    Height:        General: Pt is alert, awake, not in acute distress. Obese Cardiovascular: S1/S2 +, no rubs, no gallops Respiratory: CTA bilaterally, no wheezing, no rhonchi Abdominal: Soft, NT, obese, bowel sounds + Extremities:  no cyanosis    The results of significant diagnostics from this hospitalization (including imaging, microbiology, ancillary and laboratory) are listed below for  reference.     Microbiology: Recent Results (from the past 240 hours)  Urine Culture     Status: Abnormal   Collection Time: 03/08/24  7:43 PM   Specimen: Urine, Clean Catch  Result Value Ref Range Status   Specimen Description   Final    URINE, CLEAN CATCH Performed at [facility], [address], [place], [place] [address]    Special Requests   Final    NONE Performed at [facility], [address], [place], [place] [address]    Culture >=100,000 COLONIES/mL ESCHERICHIA COLI (A)  Final   Report Status 03/11/2024 FINAL  Final   Organism ID, Bacteria ESCHERICHIA COLI (A)  Final      Susceptibility   Escherichia coli - MIC*    AMPICILLIN <=2 SENSITIVE Sensitive     CEFAZOLIN  <=4 SENSITIVE Sensitive     CEFEPIME <=0.12 SENSITIVE Sensitive     CEFTRIAXONE  <=0.25 SENSITIVE Sensitive     CIPROFLOXACIN  <=0.25 SENSITIVE Sensitive     GENTAMICIN <=1 SENSITIVE Sensitive     IMIPENEM <=0.25 SENSITIVE Sensitive     NITROFURANTOIN <=16 SENSITIVE Sensitive     TRIMETH/SULFA <=20 SENSITIVE Sensitive     AMPICILLIN/SULBACTAM <=2 SENSITIVE Sensitive     PIP/TAZO <=4 SENSITIVE Sensitive ug/mL    * >=100,000 COLONIES/mL ESCHERICHIA COLI  Blood culture (routine x 2)     Status: None   Collection Time: 03/08/24 11:53 PM   Specimen: BLOOD  Result Value Ref Range Status   Specimen Description BLOOD BLOOD LEFT HAND  Final   Special Requests   Final    BOTTLES DRAWN AEROBIC AND ANAEROBIC Blood Culture adequate volume   Culture   Final    NO GROWTH 5 DAYS Performed at [facility], [address], [place], [place] [address]    Report Status 03/13/2024 FINAL  Final  Blood culture (routine x 2)     Status: None   Collection Time: 03/08/24 11:55 PM   Specimen: BLOOD  Result Value Ref Range Status   Specimen Description BLOOD BLOOD LEFT HAND  Final   Special Requests   Final    BOTTLES DRAWN AEROBIC ONLY Blood Culture  adequate volume   Culture   Final    NO GROWTH 5 DAYS Performed at [facility], [address] [place], [place] [address]    Report Status 03/13/2024 FINAL  Final     Labs: BNP (last 3 results) No results for input(s): "BNP" in the last 8760 hours. Basic Metabolic Panel: Recent Labs  Lab 03/13/24 0555 03/14/24 0337 03/15/24 0425 03/16/24 1230 03/17/24 0457 03/18/24 0513  NA 136  --  140 137 137 137  K 5.0  --  4.7 3.6 4.7 3.8  CL 106  --  109 106 105 106  CO2 20*  --  23 23 26 24   GLUCOSE 102*  --  122* 123* 98 130*  BUN 40*  --  37* 34* 33* 30*  CREATININE 2.35* 2.35* 2.28* 2.09* 2.14* 1.89*  CALCIUM  7.4*  --  7.0* 7.0* 7.4* 7.4*  PHOS  --   --  1.9*  --   --   --    Liver Function Tests: Recent Labs  Lab 03/12/24 0349 03/13/24 0554 03/15/24 0425 03/16/24 1230 03/17/24 0457 03/18/24 0513  AST 473* 417*  --  282* 235* 171*  ALT 172* 157*  --  134* 121* 102*  ALKPHOS 891* 795*  --  636* 635* 581*  BILITOT 5.9* 5.9*  --  4.1* 3.1* 2.5*  PROT 5.8* 5.9*  --  6.4* 6.2* 5.6*  ALBUMIN  1.7* 2.1* 2.3* 2.2* 2.0* 2.0*   No results for input(s): "LIPASE", "AMYLASE" in the last 168 hours. No results for input(s): "AMMONIA" in the last 168 hours. CBC: Recent Labs  Lab 03/14/24 0337 03/15/24 0425 03/15/24 1835 03/16/24 1230 03/17/24 0457 03/18/24 0513  WBC 8.9 7.4  --  7.2 7.2 6.1  HGB 7.4* 6.7* 8.8* 8.7* 8.4* 7.8*  HCT 23.0* 20.1* 26.4* 27.1* 25.4* 23.4*  MCV 96.2 92.2  --  94.4 92.7 93.6  PLT 188 180  --  196 202 181   Cardiac Enzymes: No results for input(s): "CKTOTAL", "CKMB", "CKMBINDEX", "TROPONINI" in the last 168 hours. BNP: Invalid input(s): "POCBNP" CBG: Recent Labs  Lab 03/12/24 1658  GLUCAP 80   D-Dimer No results for input(s): "DDIMER" in the last 72 hours. Hgb A1c No results for input(s): "HGBA1C" in the last 72 hours. Lipid Profile No results for input(s): "CHOL", "HDL", "LDLCALC", "TRIG", "CHOLHDL", "LDLDIRECT" in the last 72 hours. Thyroid  function studies No results for input(s): "TSH", "T4TOTAL", "T3FREE",  "THYROIDAB" in the last 72 hours.  Invalid input(s): "FREET3" Anemia work up No results for input(s): "VITAMINB12", "FOLATE", "FERRITIN", "TIBC", "IRON", "RETICCTPCT" in the last 72 hours. Urinalysis    Component Value Date/Time   COLORURINE AMBER (A) 03/08/2024 1943   APPEARANCEUR CLOUDY (A) 03/08/2024 1943   LABSPEC 1.008 03/08/2024 1943   PHURINE 5.0 03/08/2024 1943   GLUCOSEU NEGATIVE 03/08/2024 1943   HGBUR LARGE (A) 03/08/2024 1943   BILIRUBINUR NEGATIVE 03/08/2024 1943   KETONESUR NEGATIVE 03/08/2024 1943   PROTEINUR 100 (A) 03/08/2024 1943   NITRITE NEGATIVE 03/08/2024 1943   LEUKOCYTESUR LARGE (A) 03/08/2024 1943   Sepsis Labs Recent Labs  Lab 03/15/24 0425 03/16/24 1230 03/17/24 0457 03/18/24 0513  WBC 7.4 7.2 7.2 6.1   Microbiology Recent Results (from the past 240 hours)  Urine Culture     Status: Abnormal   Collection Time: 03/08/24  7:43 PM   Specimen: Urine, Clean Catch  Result Value Ref Range Status   Specimen Description   Final    URINE, CLEAN CATCH Performed  at [facility], [address] [place], [place] [address]    Special Requests   Final    NONE Performed at [facility], [address] [place], [place] [address]    Culture >=100,000 COLONIES/mL ESCHERICHIA COLI (A)  Final   Report Status 03/11/2024 FINAL  Final   Organism ID, Bacteria ESCHERICHIA COLI (A)  Final      Susceptibility   Escherichia coli - MIC*    AMPICILLIN <=2 SENSITIVE Sensitive     CEFAZOLIN  <=4 SENSITIVE Sensitive     CEFEPIME <=0.12 SENSITIVE Sensitive     CEFTRIAXONE  <=0.25 SENSITIVE Sensitive     CIPROFLOXACIN  <=0.25 SENSITIVE Sensitive     GENTAMICIN <=1 SENSITIVE Sensitive     IMIPENEM <=0.25 SENSITIVE Sensitive     NITROFURANTOIN <=16 SENSITIVE Sensitive     TRIMETH/SULFA <=20 SENSITIVE Sensitive     AMPICILLIN/SULBACTAM <=2 SENSITIVE Sensitive     PIP/TAZO <=4 SENSITIVE Sensitive ug/mL    * >=100,000 COLONIES/mL ESCHERICHIA COLI  Blood culture (routine x 2)      Status: None   Collection Time: 03/08/24 11:53 PM   Specimen: BLOOD  Result Value Ref Range Status   Specimen Description BLOOD BLOOD LEFT HAND  Final   Special Requests   Final    BOTTLES DRAWN AEROBIC AND ANAEROBIC Blood Culture adequate volume   Culture   Final    NO GROWTH 5 DAYS Performed at [facility], [address], [place], [place] [address]    Report Status 03/13/2024 FINAL  Final  Blood culture (routine x 2)     Status: None   Collection Time: 03/08/24 11:55 PM   Specimen: BLOOD  Result Value Ref Range Status   Specimen Description BLOOD BLOOD LEFT HAND  Final   Special Requests   Final    BOTTLES DRAWN AEROBIC ONLY Blood Culture adequate volume   Culture   Final    NO GROWTH 5 DAYS Performed at [facility], [address], [place], [place] [address]    Report Status 03/13/2024 FINAL  Final     Time coordinating discharge: Over 30 minutes  SIGNED:   Alphonsus Jeans, MD  [facility] 03/18/2024, 1:40 PM Pager   If 7PM-7AM, please contact night-coverage [email or web address]

## 2024-03-18 NOTE — TOC Transition Note (Addendum)
 Transition of Care [facility]) - Discharge Note   Patient Details  Name: Stacey [name] MRN: [id number] Date of Birth: [birth year]  [facility] CM/SW Contact:  Doria Fern C Ladonna Vanorder, RN Phone Number: 03/18/2024, 1:45 PM   Clinical Narrative:    Spoke with patient regarding discharge plans. She is agreeable to [facility] and does not have a preference of an agency. Patient advised the accepting agency would contact her to schedule her appointment for [facility]. Patient verififed her address for EMS transport and stated her son is home waiting for her arrival.   Referral accepted by Bartholomew Light from [place].   EMS arranged  TOC signing off.           Patient Goals and CMS Choice            Discharge Placement                       Discharge Plan and Services Additional resources added to the After Visit Summary for                                       Social Drivers of Health (SDOH) Interventions SDOH Screenings   Food Insecurity: No Food Insecurity (03/09/2024)  Housing: Low Risk  (03/09/2024)  Transportation Needs: No Transportation Needs (03/09/2024)  Recent Concern: Transportation Needs - Unmet Transportation Needs (01/04/2024)   Received from [facility]  Utilities: Not At Risk (03/09/2024)  Financial Resource Strain: High Risk (01/04/2024)   Received from [facility]  Physical Activity: Inactive (08/17/2023)   Received from [facility]  Social Connections: Socially Isolated (03/09/2024)  Stress: No Stress Concern Present (08/17/2023)   Received from [facility]  Tobacco Use: Medium Risk (03/09/2024)  Health Literacy: Inadequate Health Literacy (08/17/2023)   Received from [facility]     Readmission Risk Interventions    03/09/2024    8:15 AM  Readmission Risk Prevention Plan  Transportation Screening Complete  HRI or Home Care Consult Complete  Social Work Consult for  Recovery Care Planning/Counseling Complete  Palliative Care Screening Complete  Medication Review Oceanographer) Complete

## 2024-03-21 NOTE — Telephone Encounter (Signed)
 Reminder set for 6 weeks to call pt a schedule ercp

## 2024-03-22 ENCOUNTER — Other Ambulatory Visit: Payer: Self-pay

## 2024-03-23 ENCOUNTER — Inpatient Hospital Stay: Payer: Medicare Other | Attending: Hematology

## 2024-03-23 ENCOUNTER — Inpatient Hospital Stay: Payer: Medicare Other

## 2024-03-23 VITALS — BP 139/62 | HR 80 | Temp 98.4°F | Resp 20

## 2024-03-23 DIAGNOSIS — C7951 Secondary malignant neoplasm of bone: Secondary | ICD-10-CM | POA: Diagnosis present

## 2024-03-23 DIAGNOSIS — Z5111 Encounter for antineoplastic chemotherapy: Secondary | ICD-10-CM | POA: Insufficient documentation

## 2024-03-23 DIAGNOSIS — C50911 Malignant neoplasm of unspecified site of right female breast: Secondary | ICD-10-CM

## 2024-03-23 DIAGNOSIS — Z1732 Human epidermal growth factor receptor 2 negative status: Secondary | ICD-10-CM | POA: Insufficient documentation

## 2024-03-23 DIAGNOSIS — C50411 Malignant neoplasm of upper-outer quadrant of right female breast: Secondary | ICD-10-CM | POA: Diagnosis present

## 2024-03-23 DIAGNOSIS — Z1721 Progesterone receptor positive status: Secondary | ICD-10-CM | POA: Insufficient documentation

## 2024-03-23 DIAGNOSIS — Z170 Estrogen receptor positive status [ER+]: Secondary | ICD-10-CM | POA: Insufficient documentation

## 2024-03-23 LAB — COMPREHENSIVE METABOLIC PANEL WITH GFR
ALT: 53 U/L — ABNORMAL HIGH (ref 0–44)
AST: 106 U/L — ABNORMAL HIGH (ref 15–41)
Albumin: 2.1 g/dL — ABNORMAL LOW (ref 3.5–5.0)
Alkaline Phosphatase: 696 U/L — ABNORMAL HIGH (ref 38–126)
Anion gap: 9 (ref 5–15)
BUN: 21 mg/dL (ref 8–23)
CO2: 24 mmol/L (ref 22–32)
Calcium: 8.4 mg/dL — ABNORMAL LOW (ref 8.9–10.3)
Chloride: 102 mmol/L (ref 98–111)
Creatinine, Ser: 1.45 mg/dL — ABNORMAL HIGH (ref 0.44–1.00)
GFR, Estimated: 37 mL/min — ABNORMAL LOW (ref >60)
Glucose, Bld: 113 mg/dL — ABNORMAL HIGH (ref 70–99)
Potassium: 3.9 mmol/L (ref 3.5–5.1)
Sodium: 135 mmol/L (ref 135–145)
Total Bilirubin: 2 mg/dL — ABNORMAL HIGH (ref 0.0–1.2)
Total Protein: 6.4 g/dL — ABNORMAL LOW (ref 6.5–8.1)

## 2024-03-23 MED ORDER — DENOSUMAB 120 MG/1.7ML ~~LOC~~ SOLN
120.0000 mg | Freq: Once | SUBCUTANEOUS | Status: AC
  Administered 2024-03-23: 120 mg via SUBCUTANEOUS
  Filled 2024-03-23: qty 1.7

## 2024-03-23 MED ORDER — FULVESTRANT 250 MG/5ML IM SOSY
500.0000 mg | PREFILLED_SYRINGE | Freq: Once | INTRAMUSCULAR | Status: AC
  Administered 2024-03-23: 500 mg via INTRAMUSCULAR
  Filled 2024-03-23: qty 10

## 2024-03-23 NOTE — Progress Notes (Signed)
Patient tolerated Faslodex injection with no complaints voiced. Bilateral sites clean and dry with no bruising or swelling noted. Band aids applied. See MAR for details. Patient stable during and after injections.    Patient taking calcium as directed. Denied tooth, jaw, and leg pain. No recent or upcoming dental visits. Labs reviewed. Patient tolerated injection with no complaints voiced. See MAR for details. Patient stable during and after injection. Site clean and dry with no bruising or swelling noted. Band aid applied. Vss with discharge and left in satisfactory condition with no s/s of distress.   

## 2024-03-23 NOTE — Patient Instructions (Signed)
 [facility] [place] - A DEPT OF [facility]  Discharge Instructions: Thank you for choosing [facility] to provide your oncology and hematology care.  If you have a lab appointment with the Cancer Center - please note that after April 8th, 2024, all labs will be drawn in the cancer center.  You do not have to check in or register with the main entrance as you have in the past but will complete your check-in in the cancer center.  Wear comfortable clothing and clothing appropriate for easy access to any Portacath or PICC line.   We strive to give you quality time with your provider. You may need to reschedule your appointment if you arrive late (15 or more minutes).  Arriving late affects you and other patients whose appointments are after yours.  Also, if you miss three or more appointments without notifying the office, you may be dismissed from the clinic at the provider's discretion.      For prescription refill requests, have your pharmacy contact our office and allow 72 hours for refills to be completed.    Today you received the following Faslowdex and Xgeva , return as scheduled.   To help prevent nausea and vomiting after your treatment, we encourage you to take your nausea medication as directed.  BELOW ARE SYMPTOMS THAT SHOULD BE REPORTED IMMEDIATELY: *FEVER GREATER THAN 100.4 F (38 C) OR HIGHER *CHILLS OR SWEATING *NAUSEA AND VOMITING THAT IS NOT CONTROLLED WITH YOUR NAUSEA MEDICATION *UNUSUAL SHORTNESS OF BREATH *UNUSUAL BRUISING OR BLEEDING *URINARY PROBLEMS (pain or burning when urinating, or frequent urination) *BOWEL PROBLEMS (unusual diarrhea, constipation, pain near the anus) TENDERNESS IN MOUTH AND THROAT WITH OR WITHOUT PRESENCE OF ULCERS (sore throat, sores in mouth, or a toothache) UNUSUAL RASH, SWELLING OR PAIN  UNUSUAL VAGINAL DISCHARGE OR ITCHING   Items with * indicate a potential emergency and should be followed up as soon as  possible or go to the Emergency Department if any problems should occur.  Please show the CHEMOTHERAPY ALERT CARD or IMMUNOTHERAPY ALERT CARD at check-in to the Emergency Department and triage nurse.  Should you have questions after your visit or need to cancel or reschedule your appointment, please contact [facility] [place] - A DEPT OF [facility] [phone]  and follow the prompts.  Office hours are 8:00 a.m. to 4:30 p.m. Monday - Friday. Please note that voicemails left after 4:00 p.m. may not be returned until the following business day.  We are closed weekends and major holidays. You have access to a nurse at all times for urgent questions. Please call the main number to the clinic [phone] and follow the prompts.  For any non-urgent questions, you may also contact your provider using MyChart. We now offer e-Visits for anyone 8 and older to request care online for non-urgent symptoms. For details visit [email or web address].   Also download the MyChart app! Go to the app store, search "MyChart", open the app, select , and log in with your MyChart username and password.

## 2024-03-24 ENCOUNTER — Telehealth: Payer: Self-pay | Admitting: Cardiology

## 2024-03-24 NOTE — Telephone Encounter (Signed)
 Pt c/o medication issue:  1. Name of Medication: metoprolol  succinate (TOPROL -XL) 25 MG 24 hr tablet   2. How are you currently taking this medication (dosage and times per day)? As written  3. Are you having a reaction (difficulty breathing--STAT)? No   4. What is your medication issue? Home health is making sure the new dosage amount is correct.  If you need to fax any orders Fax# [phone]

## 2024-03-24 NOTE — Telephone Encounter (Signed)
 Spoke with [employer] RN with [facility] and advised that metoprolol  succinate dose is 75 mg daily.  Verbalized understanding.

## 2024-04-06 ENCOUNTER — Other Ambulatory Visit: Payer: Self-pay | Admitting: Cardiology

## 2024-04-12 ENCOUNTER — Encounter (HOSPITAL_COMMUNITY): Payer: Self-pay

## 2024-04-14 ENCOUNTER — Encounter: Payer: Self-pay | Admitting: Internal Medicine

## 2024-04-14 ENCOUNTER — Other Ambulatory Visit: Payer: Self-pay

## 2024-04-14 ENCOUNTER — Inpatient Hospital Stay
Admission: AD | Admit: 2024-04-14 | Discharge: 2024-04-24 | DRG: 444 | Disposition: A | Discharge status: Home / Self Care | Source: Ambulatory Visit | Attending: Obstetrics and Gynecology | Admitting: Obstetrics and Gynecology

## 2024-04-14 DIAGNOSIS — K831 Obstruction of bile duct: Secondary | ICD-10-CM | POA: Diagnosis present

## 2024-04-14 DIAGNOSIS — I252 Old myocardial infarction: Secondary | ICD-10-CM

## 2024-04-14 DIAGNOSIS — R188 Other ascites: Secondary | ICD-10-CM | POA: Diagnosis present

## 2024-04-14 DIAGNOSIS — K750 Abscess of liver: Secondary | ICD-10-CM

## 2024-04-14 DIAGNOSIS — Z5329 Procedure and treatment not carried out because of patient's decision for other reasons: Secondary | ICD-10-CM | POA: Diagnosis not present

## 2024-04-14 DIAGNOSIS — E66812 Obesity, class 2: Secondary | ICD-10-CM | POA: Diagnosis present

## 2024-04-14 DIAGNOSIS — D631 Anemia in chronic kidney disease: Secondary | ICD-10-CM | POA: Diagnosis present

## 2024-04-14 DIAGNOSIS — Z7902 Long term (current) use of antithrombotics/antiplatelets: Secondary | ICD-10-CM

## 2024-04-14 DIAGNOSIS — I2510 Atherosclerotic heart disease of native coronary artery without angina pectoris: Secondary | ICD-10-CM | POA: Diagnosis present

## 2024-04-14 DIAGNOSIS — Z7401 Bed confinement status: Secondary | ICD-10-CM | POA: Diagnosis not present

## 2024-04-14 DIAGNOSIS — Z515 Encounter for palliative care: Secondary | ICD-10-CM

## 2024-04-14 DIAGNOSIS — R1084 Generalized abdominal pain: Secondary | ICD-10-CM | POA: Diagnosis not present

## 2024-04-14 DIAGNOSIS — Z66 Do not resuscitate: Secondary | ICD-10-CM | POA: Diagnosis present

## 2024-04-14 DIAGNOSIS — I447 Left bundle-branch block, unspecified: Secondary | ICD-10-CM | POA: Diagnosis present

## 2024-04-14 DIAGNOSIS — I10 Essential (primary) hypertension: Secondary | ICD-10-CM | POA: Diagnosis not present

## 2024-04-14 DIAGNOSIS — E8809 Other disorders of plasma-protein metabolism, not elsewhere classified: Secondary | ICD-10-CM | POA: Diagnosis present

## 2024-04-14 DIAGNOSIS — R160 Hepatomegaly, not elsewhere classified: Secondary | ICD-10-CM | POA: Diagnosis not present

## 2024-04-14 DIAGNOSIS — I482 Chronic atrial fibrillation, unspecified: Secondary | ICD-10-CM | POA: Diagnosis present

## 2024-04-14 DIAGNOSIS — E16A1 Hypoglycemia level 1: Secondary | ICD-10-CM | POA: Diagnosis not present

## 2024-04-14 DIAGNOSIS — L89152 Pressure ulcer of sacral region, stage 2: Secondary | ICD-10-CM | POA: Diagnosis present

## 2024-04-14 DIAGNOSIS — D693 Immune thrombocytopenic purpura: Secondary | ICD-10-CM | POA: Diagnosis present

## 2024-04-14 DIAGNOSIS — F32A Depression, unspecified: Secondary | ICD-10-CM | POA: Diagnosis present

## 2024-04-14 DIAGNOSIS — Z170 Estrogen receptor positive status [ER+]: Secondary | ICD-10-CM

## 2024-04-14 DIAGNOSIS — C7951 Secondary malignant neoplasm of bone: Secondary | ICD-10-CM | POA: Diagnosis present

## 2024-04-14 DIAGNOSIS — Z7901 Long term (current) use of anticoagulants: Secondary | ICD-10-CM

## 2024-04-14 DIAGNOSIS — G8929 Other chronic pain: Secondary | ICD-10-CM | POA: Diagnosis present

## 2024-04-14 DIAGNOSIS — D72829 Elevated white blood cell count, unspecified: Secondary | ICD-10-CM | POA: Diagnosis present

## 2024-04-14 DIAGNOSIS — C50911 Malignant neoplasm of unspecified site of right female breast: Secondary | ICD-10-CM | POA: Diagnosis not present

## 2024-04-14 DIAGNOSIS — J449 Chronic obstructive pulmonary disease, unspecified: Secondary | ICD-10-CM | POA: Diagnosis present

## 2024-04-14 DIAGNOSIS — E785 Hyperlipidemia, unspecified: Secondary | ICD-10-CM | POA: Diagnosis present

## 2024-04-14 DIAGNOSIS — C649 Malignant neoplasm of unspecified kidney, except renal pelvis: Secondary | ICD-10-CM | POA: Diagnosis present

## 2024-04-14 DIAGNOSIS — I130 Hypertensive heart and chronic kidney disease with heart failure and stage 1 through stage 4 chronic kidney disease, or unspecified chronic kidney disease: Secondary | ICD-10-CM | POA: Diagnosis present

## 2024-04-14 DIAGNOSIS — Z8249 Family history of ischemic heart disease and other diseases of the circulatory system: Secondary | ICD-10-CM

## 2024-04-14 DIAGNOSIS — C50919 Malignant neoplasm of unspecified site of unspecified female breast: Secondary | ICD-10-CM | POA: Diagnosis present

## 2024-04-14 DIAGNOSIS — R000 Tachycardia, unspecified: Secondary | ICD-10-CM | POA: Diagnosis not present

## 2024-04-14 DIAGNOSIS — K769 Liver disease, unspecified: Secondary | ICD-10-CM

## 2024-04-14 DIAGNOSIS — R112 Nausea with vomiting, unspecified: Secondary | ICD-10-CM | POA: Diagnosis not present

## 2024-04-14 DIAGNOSIS — Z87891 Personal history of nicotine dependence: Secondary | ICD-10-CM

## 2024-04-14 DIAGNOSIS — F3181 Bipolar II disorder: Secondary | ICD-10-CM | POA: Diagnosis present

## 2024-04-14 DIAGNOSIS — K8309 Other cholangitis: Secondary | ICD-10-CM | POA: Diagnosis present

## 2024-04-14 DIAGNOSIS — N1832 Chronic kidney disease, stage 3b: Secondary | ICD-10-CM | POA: Diagnosis present

## 2024-04-14 DIAGNOSIS — K269 Duodenal ulcer, unspecified as acute or chronic, without hemorrhage or perforation: Secondary | ICD-10-CM | POA: Diagnosis present

## 2024-04-14 DIAGNOSIS — Z85528 Personal history of other malignant neoplasm of kidney: Secondary | ICD-10-CM

## 2024-04-14 DIAGNOSIS — Z86711 Personal history of pulmonary embolism: Secondary | ICD-10-CM

## 2024-04-14 DIAGNOSIS — I480 Paroxysmal atrial fibrillation: Secondary | ICD-10-CM | POA: Diagnosis present

## 2024-04-14 DIAGNOSIS — I5032 Chronic diastolic (congestive) heart failure: Secondary | ICD-10-CM | POA: Diagnosis present

## 2024-04-14 DIAGNOSIS — M8458XA Pathological fracture in neoplastic disease, other specified site, initial encounter for fracture: Secondary | ICD-10-CM | POA: Diagnosis present

## 2024-04-14 DIAGNOSIS — F419 Anxiety disorder, unspecified: Secondary | ICD-10-CM | POA: Diagnosis not present

## 2024-04-14 DIAGNOSIS — G893 Neoplasm related pain (acute) (chronic): Secondary | ICD-10-CM | POA: Diagnosis not present

## 2024-04-14 DIAGNOSIS — Z6837 Body mass index (BMI) 37.0-37.9, adult: Secondary | ICD-10-CM

## 2024-04-14 DIAGNOSIS — G8220 Paraplegia, unspecified: Secondary | ICD-10-CM | POA: Diagnosis present

## 2024-04-14 DIAGNOSIS — Z825 Family history of asthma and other chronic lower respiratory diseases: Secondary | ICD-10-CM

## 2024-04-14 DIAGNOSIS — R748 Abnormal levels of other serum enzymes: Secondary | ICD-10-CM | POA: Diagnosis not present

## 2024-04-14 DIAGNOSIS — Z5986 Financial insecurity: Secondary | ICD-10-CM

## 2024-04-14 DIAGNOSIS — D696 Thrombocytopenia, unspecified: Secondary | ICD-10-CM | POA: Diagnosis present

## 2024-04-14 DIAGNOSIS — D630 Anemia in neoplastic disease: Secondary | ICD-10-CM | POA: Diagnosis present

## 2024-04-14 DIAGNOSIS — R54 Age-related physical debility: Secondary | ICD-10-CM | POA: Diagnosis present

## 2024-04-14 DIAGNOSIS — Z955 Presence of coronary angioplasty implant and graft: Secondary | ICD-10-CM

## 2024-04-14 DIAGNOSIS — M25511 Pain in right shoulder: Secondary | ICD-10-CM | POA: Diagnosis not present

## 2024-04-14 DIAGNOSIS — Z888 Allergy status to other drugs, medicaments and biological substances status: Secondary | ICD-10-CM

## 2024-04-14 DIAGNOSIS — R0902 Hypoxemia: Secondary | ICD-10-CM | POA: Diagnosis not present

## 2024-04-14 DIAGNOSIS — Z79899 Other long term (current) drug therapy: Secondary | ICD-10-CM

## 2024-04-14 DIAGNOSIS — Z1721 Progesterone receptor positive status: Secondary | ICD-10-CM

## 2024-04-14 DIAGNOSIS — Z9049 Acquired absence of other specified parts of digestive tract: Secondary | ICD-10-CM

## 2024-04-14 LAB — COMPREHENSIVE METABOLIC PANEL WITH GFR
ALT: 63 U/L — ABNORMAL HIGH (ref 0–44)
AST: 189 U/L — ABNORMAL HIGH (ref 15–41)
Albumin: 1.9 g/dL — ABNORMAL LOW (ref 3.5–5.0)
Alkaline Phosphatase: 608 U/L — ABNORMAL HIGH (ref 38–126)
Anion gap: 11 (ref 5–15)
BUN: 32 mg/dL — ABNORMAL HIGH (ref 8–23)
CO2: 19 mmol/L — ABNORMAL LOW (ref 22–32)
Calcium: 7.1 mg/dL — ABNORMAL LOW (ref 8.9–10.3)
Chloride: 103 mmol/L (ref 98–111)
Creatinine, Ser: 1.06 mg/dL — ABNORMAL HIGH (ref 0.44–1.00)
GFR, Estimated: 54 mL/min — ABNORMAL LOW (ref >60)
Glucose, Bld: 122 mg/dL — ABNORMAL HIGH (ref 70–99)
Potassium: 3.4 mmol/L — ABNORMAL LOW (ref 3.5–5.1)
Sodium: 133 mmol/L — ABNORMAL LOW (ref 135–145)
Total Bilirubin: 8.5 mg/dL — ABNORMAL HIGH (ref 0.0–1.2)
Total Protein: 6.4 g/dL — ABNORMAL LOW (ref 6.5–8.1)

## 2024-04-14 LAB — HEPARIN LEVEL (UNFRACTIONATED): Heparin Unfractionated: 0.44 [IU]/mL (ref 0.30–0.70)

## 2024-04-14 LAB — PROTIME-INR
INR: 1.2 (ref 0.8–1.2)
Prothrombin Time: 15.8 s — ABNORMAL HIGH (ref 11.4–15.2)

## 2024-04-14 LAB — BRAIN NATRIURETIC PEPTIDE: B Natriuretic Peptide: 406.8 pg/mL — ABNORMAL HIGH (ref 0.0–100.0)

## 2024-04-14 LAB — CBC WITH DIFFERENTIAL/PLATELET
Abs Immature Granulocytes: 0 10*3/uL (ref 0.00–0.07)
Basophils Absolute: 0 10*3/uL (ref 0.0–0.1)
Basophils Relative: 0 %
Eosinophils Absolute: 0 10*3/uL (ref 0.0–0.5)
Eosinophils Relative: 0 %
HCT: 30.7 % — ABNORMAL LOW (ref 36.0–46.0)
Hemoglobin: 9.8 g/dL — ABNORMAL LOW (ref 12.0–15.0)
Lymphocytes Relative: 5 %
Lymphs Abs: 1.2 10*3/uL (ref 0.7–4.0)
MCH: 30.8 pg (ref 26.0–34.0)
MCHC: 31.9 g/dL (ref 30.0–36.0)
MCV: 96.5 fL (ref 80.0–100.0)
Monocytes Absolute: 0.2 10*3/uL (ref 0.1–1.0)
Monocytes Relative: 1 %
Neutro Abs: 22.2 10*3/uL — ABNORMAL HIGH (ref 1.7–7.7)
Neutrophils Relative %: 94 %
Platelets: 93 10*3/uL — ABNORMAL LOW (ref 150–400)
RBC: 3.18 MIL/uL — ABNORMAL LOW (ref 3.87–5.11)
RDW: 20.4 % — ABNORMAL HIGH (ref 11.5–15.5)
Smear Review: NORMAL
WBC: 23.6 10*3/uL — ABNORMAL HIGH (ref 4.0–10.5)
nRBC: 0 % (ref 0.0–0.2)

## 2024-04-14 LAB — APTT: aPTT: 39 s — ABNORMAL HIGH (ref 24–36)

## 2024-04-14 LAB — LACTATE DEHYDROGENASE: LDH: 226 U/L — ABNORMAL HIGH (ref 98–192)

## 2024-04-14 MED ORDER — METOPROLOL SUCCINATE ER 50 MG PO TB24
75.0000 mg | ORAL_TABLET | Freq: Every day | ORAL | Status: DC
  Administered 2024-04-15 – 2024-04-24 (×10): 75 mg via ORAL
  Filled 2024-04-14 (×10): qty 1

## 2024-04-14 MED ORDER — AMLODIPINE BESYLATE 5 MG PO TABS
5.0000 mg | ORAL_TABLET | Freq: Every day | ORAL | Status: DC
  Administered 2024-04-15 – 2024-04-24 (×10): 5 mg via ORAL
  Filled 2024-04-14 (×10): qty 1

## 2024-04-14 MED ORDER — IPRATROPIUM BROMIDE 0.02 % IN SOLN: 0.5000 mg | RESPIRATORY_TRACT | Status: DC | PRN

## 2024-04-14 MED ORDER — ONDANSETRON HCL 4 MG/2ML IJ SOLN
4.0000 mg | Freq: Three times a day (TID) | INTRAMUSCULAR | Status: DC | PRN
  Administered 2024-04-19: 4 mg via INTRAVENOUS
  Filled 2024-04-14: qty 2

## 2024-04-14 MED ORDER — MORPHINE SULFATE (PF) 2 MG/ML IV SOLN
1.0000 mg | INTRAVENOUS | Status: DC | PRN
  Administered 2024-04-16: 1 mg via INTRAVENOUS
  Filled 2024-04-14: qty 1

## 2024-04-14 MED ORDER — NITROGLYCERIN 0.4 MG SL SUBL: 0.4000 mg | SUBLINGUAL_TABLET | SUBLINGUAL | Status: DC | PRN

## 2024-04-14 MED ORDER — IBUPROFEN 400 MG PO TABS
200.0000 mg | ORAL_TABLET | Freq: Four times a day (QID) | ORAL | Status: DC | PRN
  Administered 2024-04-16 – 2024-04-23 (×6): 200 mg via ORAL
  Filled 2024-04-14 (×6): qty 1

## 2024-04-14 MED ORDER — DULOXETINE HCL 30 MG PO CPEP
30.0000 mg | ORAL_CAPSULE | Freq: Every day | ORAL | Status: DC
  Administered 2024-04-15 – 2024-04-24 (×10): 30 mg via ORAL
  Filled 2024-04-14 (×10): qty 1

## 2024-04-14 MED ORDER — LOSARTAN POTASSIUM 50 MG PO TABS
100.0000 mg | ORAL_TABLET | Freq: Every day | ORAL | Status: DC
  Administered 2024-04-15 – 2024-04-24 (×10): 100 mg via ORAL
  Filled 2024-04-14 (×10): qty 2

## 2024-04-14 MED ORDER — IPRATROPIUM BROMIDE HFA 17 MCG/ACT IN AERS: 2.0000 | INHALATION_SPRAY | RESPIRATORY_TRACT | Status: DC | PRN

## 2024-04-14 MED ORDER — HYDRALAZINE HCL 20 MG/ML IJ SOLN: 5.0000 mg | INTRAMUSCULAR | Status: DC | PRN

## 2024-04-14 MED ORDER — PIPERACILLIN-TAZOBACTAM 3.375 G IVPB
3.3750 g | Freq: Once | INTRAVENOUS | Status: AC
  Administered 2024-04-14: 3.375 g via INTRAVENOUS
  Filled 2024-04-14: qty 50

## 2024-04-14 MED ORDER — PANTOPRAZOLE SODIUM 40 MG PO TBEC
40.0000 mg | DELAYED_RELEASE_TABLET | Freq: Two times a day (BID) | ORAL | Status: DC
  Administered 2024-04-15 – 2024-04-24 (×19): 40 mg via ORAL
  Filled 2024-04-14 (×19): qty 1

## 2024-04-14 MED ORDER — POLYVINYL ALCOHOL 1.4 % OP SOLN: 1.0000 [drp] | Freq: Three times a day (TID) | OPHTHALMIC | Status: DC | PRN

## 2024-04-14 MED ORDER — FERROUS SULFATE 325 (65 FE) MG PO TABS
325.0000 mg | ORAL_TABLET | Freq: Three times a day (TID) | ORAL | Status: DC
  Administered 2024-04-15 – 2024-04-24 (×26): 325 mg via ORAL
  Filled 2024-04-14 (×27): qty 1

## 2024-04-14 MED ORDER — OXYCODONE HCL 5 MG PO TABS
5.0000 mg | ORAL_TABLET | Freq: Four times a day (QID) | ORAL | Status: DC | PRN
  Administered 2024-04-15 – 2024-04-16 (×2): 5 mg via ORAL
  Filled 2024-04-14 (×3): qty 1

## 2024-04-14 MED ORDER — PIPERACILLIN-TAZOBACTAM 3.375 G IVPB
3.3750 g | Freq: Three times a day (TID) | INTRAVENOUS | Status: DC
  Administered 2024-04-15 – 2024-04-21 (×20): 3.375 g via INTRAVENOUS
  Filled 2024-04-14 (×20): qty 50

## 2024-04-14 MED ORDER — HEPARIN (PORCINE) 25000 UT/250ML-% IV SOLN
1000.0000 [IU]/h | INTRAVENOUS | Status: DC
  Administered 2024-04-14: 1000 [IU]/h via INTRAVENOUS
  Filled 2024-04-14: qty 250

## 2024-04-14 MED ORDER — SODIUM CHLORIDE 0.9 % IV SOLN: INTRAVENOUS | Status: AC

## 2024-04-14 MED ORDER — METHOCARBAMOL 500 MG PO TABS
500.0000 mg | ORAL_TABLET | Freq: Four times a day (QID) | ORAL | Status: DC | PRN
  Administered 2024-04-15 – 2024-04-23 (×9): 500 mg via ORAL
  Filled 2024-04-14 (×9): qty 1

## 2024-04-14 NOTE — H&P (Signed)
 History and Physical    Stacey [name]  B Rodarte WUJ:[id number] DOB: [birth year] DOA: 04/14/2024  Referring MD/NP/PA:   PCP: [name], MD   Patient coming from:  The patient is coming from home.     Chief Complaint:   HPI: Stacey [name]  [name] is a 78 y.o. female with medical history significant of hypertension, COPD, CAD, DES placement, diastolic CHF, A-fib on Eliquis , depression with anxiety, CKD-3B, PSVT, chronic pain, left bundle blockade, spinal stenosis, breast cancer metastasized to bone, left renal carcinoma, acute cholangitis and obstructive jaundice (s/p of ERCP), who presented to [facility] due to nausea, vomiting, right upper quadrant abdominal pain. Pt was accepted for transferring from [facility] per Dr. Isom Marin of [facility] ED on 5/13, arrived today.   Patient was recently hospitalized to [facility] on 4/9 - 4/18 due to acute cholangitis and obstructive jaundice.  Patient is s/p of ERCP, found to have duodenal ulcer, duodenal and sphincter stenosis.  Stent was placed and the purulent discharge was removed.   Patient states that she has nausea, multiple episodes of nonbilious nonbloody vomiting and abdominal pain in the past several days.  No fever or chills.  Abdominal pain is located in the upper abdomen, constant, mild to moderate, aching, nonradiating.  No diarrhea.  No fever or chills.  She states she had some chest discomfort yesterday, which has resolved, currently no active chest pain.  She has mild dry cough, mild SOB due to COPD.  No symptoms of UTI.  CTA of abdomen/pelvis done in [facility] showed severe intra- and extrahepatic biliary ductal dilation.  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  Common bile duct diffuse mucosa hyperenhancement, nonspecific, but can be seen with ascending cholangitis.  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  Osseous metastases not significantly changed.  Liver function with ALP 895, AST 135, ALT  41, total bilirubin 7.1, direct bilirubin 6.09.  Renal function stable, WBC 7.2, INR 1.23.  Temperature normal, initial blood pressure low with SBP in 80s, which improved to 109/63 after 30 cc/kg normal saline resuscitation.  Heart rate 100, RR 15, oxygen  saturation 99% on room air.  Chest x-ray negative for infiltration. Pt was given Zosyn  in ED.   Dr. Isom Marin of ED in [facility] consulted Dr. Bridgett Camps of GI who reached to Dr. Ole Berkeley of [facility]. Dr. Ole Berkeley agreed to see pt after arrival to [facility]. Pt is accepted to PCU as inpt.   Today new data reviewed independently: pt was found to have WBC 23.6, stable renal function, thrombocytopenia with platelet 93, liver function with ALP 608, AST 189, ALT 63, total bilirubin 8.5, INR 1.2, PTT 39, lactic acid 1.4, temperature normal, blood pressure 104/79, heart rate 98, RR 20, oxygen  saturation 100% on room air.  Patient is admitted to PCU as inpatient.  Epic message is scheduled to send to Dr. Mamie Searles and Dr. Ole Berkeley of GI at 6:00 AM   EKG: I have personally reviewed.  Atrial fibrillation, right axis deviation, poor R wave pression, low voltage   Review of Systems:   General: no fevers, chills, no body weight gain, has poor appetite, has fatigue HEENT: no blurry vision, hearing changes or sore throat Respiratory: no dyspnea, coughing, wheezing CV: no chest pain, no palpitations GI: has nausea, vomiting, abdominal pain, no diarrhea, constipation GU: no dysuria, burning on urination, increased urinary frequency, hematuria  Ext: no leg edema Neuro: no unilateral weakness, numbness, or tingling, no vision change or hearing loss  Skin: no rash, no skin tear. MSK: No muscle spasm, no deformity, no limitation of range of movement in spin Heme: No easy bruising.  Travel history: No recent long distant travel.   Allergy:  Allergies  Allergen Reactions   Brilinta  [Ticagrelor ] Diarrhea and Nausea Only    Nausea and severe diarrhea- general weakness. Patient says does not  want to take again   Budesonide  Palpitations   Albuterol  Other (See Comments)    Heart racing     Past Medical History:  Diagnosis Date   CAD in native artery    a. DES to ramus 2005 with late stent thrombosis 2006 tx with PTCA. 12/18 PCI/DES x1 to mRCA, EF 50-55%   CHF (congestive heart failure) (HCC)    Chronic pain    COPD (chronic obstructive pulmonary disease) (HCC)    Hyperlipidemia    Hypertension    Left bundle branch block    Lymphedema    Metastatic breast cancer    a. to bone.   MI (myocardial infarction) (HCC)    Mild aortic stenosis 10/2017   Morbid obesity (HCC)    PAF (paroxysmal atrial fibrillation) (HCC)    PSVT (paroxysmal supraventricular tachycardia) (HCC)    a. per [facility] notes, seen on event monitor in 2014.   Pulmonary nodules     Past Surgical History:  Procedure Laterality Date   BILIARY STENT PLACEMENT  03/09/2024   Procedure: INSERTION, STENT, BILE DUCT;  Surgeon: Marnee Sink, MD;  Location: [facility] ENDOSCOPY;  Service: Endoscopy;;   CHOLECYSTECTOMY N/A 03/29/2021   Procedure: LAPAROSCOPIC CHOLECYSTECTOMY;  Surgeon: Awilda Bogus, MD;  Location: [facility] ORS;  Service: General;  Laterality: N/A;   CORONARY STENT INTERVENTION N/A 11/26/2017   Procedure: CORONARY STENT INTERVENTION;  Surgeon: Swaziland, Peter M, MD;  Location[facility] INVASIVE CV LAB;  Service: Cardiovascular;  Laterality: N/A;   CORONARY STENT PLACEMENT     ERCP N/A 03/28/2021   Procedure: ENDOSCOPIC RETROGRADE CHOLANGIOPANCREATOGRAPHY (ERCP);  Surgeon: Ruby Corporal, MD;  Location: [facility] ORS;  Service: Endoscopy;  Laterality: N/A;   ERCP N/A 03/09/2024   Procedure: ERCP, WITH INTERVENTION IF INDICATED;  Surgeon: Marnee Sink, MD;  Location: [facility] ENDOSCOPY;  Service: Endoscopy;  Laterality: N/A;   ESOPHAGOGASTRODUODENOSCOPY  03/09/2024   Procedure: EGD (ESOPHAGOGASTRODUODENOSCOPY);  Surgeon: Marnee Sink, MD;  Location: [facility] ENDOSCOPY;  Service: Endoscopy;;   intrathecal pain pump     LEFT HEART  CATH AND CORONARY ANGIOGRAPHY N/A 11/26/2017   Procedure: LEFT HEART CATH AND CORONARY ANGIOGRAPHY;  Surgeon: Swaziland, Peter M, MD;  Location: [facility] INVASIVE CV LAB;  Service: Cardiovascular;  Laterality: N/A;   REMOVAL OF STONES  03/28/2021   Procedure: REMOVAL OF STONES;  Surgeon: Ruby Corporal, MD;  Location: [facility] ORS;  Service: Endoscopy;;   SPHINCTEROTOMY  03/28/2021   Procedure: SPHINCTEROTOMY;  Surgeon: Ruby Corporal, MD;  Location: [facility] ORS;  Service: Endoscopy;;   UMBILICAL HERNIA REPAIR N/A 04/03/2021   Procedure: ADULT PRIMARY UMBILICAL HERNIA REPAIR;  Surgeon: Awilda Bogus, MD;  Location: [facility] ORS;  Service: General;  Laterality: N/A;   VASCULAR SURGERY      Social History:  reports that she quit smoking about 43 years ago. Her smoking use included cigarettes. She started smoking about 61 years ago. She has a 36 pack-year smoking history. She has never used smokeless tobacco. She reports current drug use. Drugs: Oxycodone  and Morphine . She reports that she does not drink alcohol .  Family History:  Family History  Problem Relation Age of  Onset   CAD Father 50   Heart attack Father    COPD Sister    CAD Paternal Grandmother    Sudden Cardiac Death Neg Hx    Colon polyps Neg Hx    Colon cancer Neg Hx      Prior to Admission medications   Medication Sig Start Date End Date Taking? Authorizing Provider  acetaminophen  (TYLENOL ) 325 MG tablet Take 975 mg by mouth every 6 (six) hours as needed for mild pain (pain score 1-3).    [provider]  amLODipine  (NORVASC ) 5 MG tablet TAKE 1 TABLET EVERY DAY 04/06/24   Laurann Pollock, MD  apixaban  (ELIQUIS ) 5 MG TABS tablet Take 5 mg by mouth 2 (two) times daily.    [provider]  atorvastatin  (LIPITOR ) 80 MG tablet Take 80 mg by mouth daily.    [provider]  b complex vitamins capsule Take 1 capsule by mouth daily.    [provider]  carboxymethylcellulose (REFRESH PLUS) 0.5 % SOLN Place 1  drop into both eyes 3 (three) times daily as needed (Dry Eyes).    [provider]  clopidogrel  (PLAVIX ) 75 MG tablet Take 75 mg by mouth daily.    [provider]  diclofenac  Sodium (VOLTAREN ) 1 % GEL Apply topically. 02/25/24   [provider]  DULoxetine  (CYMBALTA ) 30 MG capsule Take 30 mg by mouth daily.    [provider]  ferrous sulfate 325 (65 FE) MG EC tablet Take 325 mg by mouth 3 (three) times daily with meals.    [provider]  HYDROmorphone  (DILAUDID ) 1 MG/ML injection Inject 0.5 mLs (0.5 mg total) into the vein every 2 (two) hours as needed for severe pain (pain score 7-10) or moderate pain (pain score 4-6). 03/09/24   Ozell Blunt, MD  HYDROmorphone  (DILAUDID ) 2 MG tablet Take 4 mg by mouth every 4 (four) hours as needed for severe pain (pain score 7-10).    [provider]  leptospermum manuka honey (MEDIHONEY) PSTE paste Apply 1 Application topically daily. 03/18/24 04/17/24  Alphonsus Jeans, MD  losartan  (COZAAR ) 100 MG tablet TAKE 1 TABLET EVERY DAY 04/06/24   Laurann Pollock, MD  methocarbamol  (ROBAXIN ) 500 MG tablet Take 500 mg by mouth every 6 (six) hours as needed for muscle spasms.    [provider]  metoprolol  succinate (TOPROL -XL) 25 MG 24 hr tablet Take 3 tablets (75 mg total) by mouth daily. 03/19/24 04/18/24  Alphonsus Jeans, MD  nitroGLYCERIN  (NITROSTAT ) 0.4 MG SL tablet Place 0.4 mg under the tongue every 5 (five) minutes as needed for chest pain.    [provider]  nystatin (MYCOSTATIN/NYSTOP) powder Apply topically 2 (two) times daily. 09/29/23 09/28/24  [provider]  ondansetron  (ZOFRAN ) 4 MG/2ML SOLN injection Inject 2 mLs (4 mg total) into the vein every 6 (six) hours as needed for nausea or vomiting. 03/09/24   Ozell Blunt, MD  pantoprazole  (PROTONIX ) 40 MG tablet Take 1 tablet (40 mg total) by mouth 2 (two) times daily. 03/18/24 04/17/24  Alphonsus Jeans, MD    Physical  Exam: Vitals:   04/14/24 1759 04/14/24 2041 04/14/24 2045 04/15/24 0007  BP: 104/79 134/70  115/62  Pulse: 98 97 (!) 101 97  Resp: 20 18  20   Temp: (!) 97.5 F (36.4 C) 97.8 F (36.6 C)  97.9 F (36.6 C)  TempSrc: Oral     SpO2: 100%  100% 100%  Weight: 87.5 kg  Height: 5' (1.524 m)      General: Not in acute distress HEENT:       Eyes: PERRL, EOMI, has jaundice       ENT: No discharge from the ears and nose, no pharynx injection, no tonsillar enlargement.        Neck: No JVD, no bruit, no mass felt. Heme: No neck lymph node enlargement. Cardiac: S1/S2, RRR, No murmurs, No gallops or rubs. Respiratory: No rales, wheezing, rhonchi or rubs. GI: Soft, nondistended, has mild tenderness in upper abdomen, no rebound pain, no organomegaly, BS present. GU: No hematuria Ext: No pitting leg edema bilaterally. 1+DP/PT pulse bilaterally. Musculoskeletal: No joint deformities, No joint redness or warmth, no limitation of ROM in spin. Skin: No rashes.  Neuro: Alert, oriented X3, cranial nerves II-XII grossly intact, moves all extremities normally. Psych: Patient is not psychotic, no suicidal or hemocidal ideation.  Labs on Admission: I have personally reviewed following labs and imaging studies  CBC: Recent Labs  Lab 04/14/24 1912  WBC 23.6*  NEUTROABS 22.2*  HGB 9.8*  HCT 30.7*  MCV 96.5  PLT 93*   Basic Metabolic Panel: Recent Labs  Lab 04/14/24 1912  NA 133*  K 3.4*  CL 103  CO2 19*  GLUCOSE 122*  BUN 32*  CREATININE 1.06*  CALCIUM  7.1*   GFR: Estimated Creatinine Clearance: 43.7 mL/min (A) (by C-G formula based on SCr of 1.06 mg/dL (H)). Liver Function Tests: Recent Labs  Lab 04/14/24 1912  AST 189*  ALT 63*  ALKPHOS 608*  BILITOT 8.5*  PROT 6.4*  ALBUMIN  1.9*   No results for input(s): "LIPASE", "AMYLASE" in the last 168 hours. No results for input(s): "AMMONIA" in the last 168 hours. Coagulation Profile: Recent Labs  Lab 04/14/24 1912  INR 1.2    Cardiac Enzymes: No results for input(s): "CKTOTAL", "CKMB", "CKMBINDEX", "TROPONINI" in the last 168 hours. BNP (last 3 results) No results for input(s): "PROBNP" in the last 8760 hours. HbA1C: No results for input(s): "HGBA1C" in the last 72 hours. CBG: No results for input(s): "GLUCAP" in the last 168 hours. Lipid Profile: No results for input(s): "CHOL", "HDL", "LDLCALC", "TRIG", "CHOLHDL", "LDLDIRECT" in the last 72 hours. Thyroid  Function Tests: No results for input(s): "TSH", "T4TOTAL", "FREET4", "T3FREE", "THYROIDAB" in the last 72 hours. Anemia Panel: No results for input(s): "VITAMINB12", "FOLATE", "FERRITIN", "TIBC", "IRON", "RETICCTPCT" in the last 72 hours. Urine analysis:    Component Value Date/Time   COLORURINE AMBER (A) 03/08/2024 1943   APPEARANCEUR CLOUDY (A) 03/08/2024 1943   LABSPEC 1.008 03/08/2024 1943   PHURINE 5.0 03/08/2024 1943   GLUCOSEU NEGATIVE 03/08/2024 1943   HGBUR LARGE (A) 03/08/2024 1943   BILIRUBINUR NEGATIVE 03/08/2024 1943   KETONESUR NEGATIVE 03/08/2024 1943   PROTEINUR 100 (A) 03/08/2024 1943   NITRITE NEGATIVE 03/08/2024 1943   LEUKOCYTESUR LARGE (A) 03/08/2024 1943   Sepsis Labs: @LABRCNTIP (procalcitonin:4,lacticidven:4) )No results found for this or any previous visit (from the past 240 hours).   Radiological Exams on Admission:   Assessment/Plan Principal Problem:   Ascending cholangitis Active Problems:   Biliary obstruction   Atrial fibrillation, chronic (HCC)   CAD (coronary artery disease)   HLD (hyperlipidemia)   Chronic diastolic CHF (congestive heart failure) (HCC)   Chronic kidney disease, stage 3b (HCC)   CAD in native artery   COPD (chronic obstructive pulmonary disease) (HCC)   HTN (hypertension)   Thrombocytopenia (HCC)   Anxiety and depression   Bipolar 2 disorder, major depressive episode (HCC)  Obesity, Class II, BMI 35-39.9   Assessment and Plan:   Ascending cholangitis and biliary obstruction:  Patient has WBC 23.6, no fever, clinically does not seem to have sepsis.  -Admit to PCU as inpatient - Continue Zosyn  - Follow-up of blood culture - As needed morphine , oxycodone  for pain - N.p.o. and IV fluid: 50 cc/h of normal saline  Atrial fibrillation, chronic (HCC):  heart rate 98 -Switch Eliquis  to IV heparin  in case patient need procedure - Metoprolol   CAD (coronary artery disease): Patent with no chest pain -Hold Plavix  in case patient need procedure - Hold her Lipitor  due to abnormal liver function  HLD (hyperlipidemia) -Hold Lipitor   Chronic diastolic CHF (congestive heart failure) (HCC): 2D echo 03/25/21 showed EF of 50-55% with grade 1 diastolic dysfunction.  BNP is elevated 406, but patient does not have leg edema or JVD.  Does not seem to have CHF exacerbation. -Watch volume status closely  Chronic kidney disease, stage 3b [facility]): Renal function stable.  Recent baseline creatinine 1.45 on 03/23/2024.  Her creatinine is 1.06, BUN 32. -Follow-up with BMP  COPD (chronic obstructive pulmonary disease) (HCC): Stable -Bronchodilators and as needed Mucinex   HTN (hypertension) -IV hydralazine  as needed - Amlodipine , Cozaar , metoprolol   Thrombocytopenia (HCC): Platelet 93.  Mental status normal. -Check LDH - peripheral smear  Anxiety and depression and bipolar 2 disorder, major depressive episode (HCC) -Continue home medication: Cymbalta   Obesity, Class II, BMI 35-39.9: Body weight 87.5 kg, BMI 37.69 - Encourage losing weight - Exercise and healthy diet  Left renal presumed renal cell carcinoma: slightly increased in size.  -may need to f/u with urology after acute issue resolves    DVT ppx: on IV Heparin    Code Status: DNR per pt. (I discussed with patient and explained the meaning of CODE STATUS. Patient wants to be DNR)  Family Communication:  Yes, patient's son by phone    Disposition Plan:  Anticipate discharge back to previous environment  Consults  called: Epic message is scheduled to send to Dr. Mamie Searles and Dr. Ole Berkeley of GI at 6:00 AM  Admission status and Level of care: Progressive:    for obs as inpt        Dispo: The patient is from: Home              Anticipated d/c is to: Home              Anticipated d/c date is: 2 days              Patient currently is not medically stable to d/c.    Severity of Illness:  The appropriate patient status for this patient is INPATIENT. Inpatient status is judged to be reasonable and necessary in order to provide the required intensity of service to ensure the patient's safety. The patient's presenting symptoms, physical exam findings, and initial radiographic and laboratory data in the context of their chronic comorbidities is felt to place them at high risk for further clinical deterioration. Furthermore, it is not anticipated that the patient will be medically stable for discharge from the hospital within 2 midnights of admission.   * I certify that at the point of admission it is my clinical judgment that the patient will require inpatient hospital care spanning beyond 2 midnights from the point of admission due to high intensity of service, high risk for further deterioration and high frequency of surveillance required.*       Date of Service 04/15/2024    Ozzy Bohlken  Lamond Glantz [facility]   If 7PM-7AM, please contact night-coverage [email or web address] 04/15/2024, 2:17 AM

## 2024-04-14 NOTE — Progress Notes (Addendum)
 PHARMACY - ANTICOAGULATION CONSULT NOTE  Pharmacy Consult for heparin  drip Indication: atrial fibrillation  Allergies  Allergen Reactions   Brilinta  [Ticagrelor ] Diarrhea and Nausea Only    Nausea and severe diarrhea- general weakness. Patient says does not want to take again   Budesonide  Palpitations   Albuterol  Other (See Comments)    Heart racing     Patient Measurements: Height: 5' (152.4 cm) Weight: 87.5 kg (193 lb) IBW/kg (Calculated) : 45.5 HEPARIN  DW (KG): 66.1  Vital Signs: Temp: 97.8 F (36.6 C) (05/15 2041) Temp Source: Oral (05/15 1759) BP: 134/70 (05/15 2041) Pulse Rate: 101 (05/15 2045)  Labs: Recent Labs    04/14/24 1912  HGB 9.8*  HCT 30.7*  PLT 93*  APTT 39*  LABPROT 15.8*  INR 1.2  CREATININE 1.06*    Estimated Creatinine Clearance: 43.7 mL/min (A) (by C-G formula based on SCr of 1.06 mg/dL (H)).   Medical History: Past Medical History:  Diagnosis Date   CAD in native artery    a. DES to ramus 2005 with late stent thrombosis 2006 tx with PTCA. 12/18 PCI/DES x1 to mRCA, EF 50-55%   CHF (congestive heart failure) (HCC)    Chronic pain    COPD (chronic obstructive pulmonary disease) (HCC)    Hyperlipidemia    Hypertension    Left bundle branch block    Lymphedema    Metastatic breast cancer    a. to bone.   MI (myocardial infarction) (HCC)    Mild aortic stenosis 10/2017   Morbid obesity (HCC)    PAF (paroxysmal atrial fibrillation) (HCC)    PSVT (paroxysmal supraventricular tachycardia) (HCC)    a. per [facility] notes, seen on event monitor in 2014.   Pulmonary nodules     Medications:  Medications Prior to Admission  Medication Sig Dispense Refill Last Dose/Taking   amLODipine  (NORVASC ) 5 MG tablet TAKE 1 TABLET EVERY DAY 90 tablet 3    apixaban  (ELIQUIS ) 5 MG TABS tablet Take 5 mg by mouth 2 (two) times daily.      atorvastatin  (LIPITOR ) 80 MG tablet Take 80 mg by mouth daily.      b complex vitamins capsule Take 1 capsule by mouth  daily.      carboxymethylcellulose (REFRESH PLUS) 0.5 % SOLN Place 1 drop into both eyes 3 (three) times daily as needed (Dry Eyes).      clopidogrel  (PLAVIX ) 75 MG tablet Take 75 mg by mouth daily.      diclofenac  Sodium (VOLTAREN ) 1 % GEL Apply topically.      DULoxetine  (CYMBALTA ) 30 MG capsule Take 30 mg by mouth daily.      ferrous sulfate 325 (65 FE) MG EC tablet Take 325 mg by mouth 3 (three) times daily with meals.      HYDROmorphone  (DILAUDID ) 1 MG/ML injection Inject 0.5 mLs (0.5 mg total) into the vein every 2 (two) hours as needed for severe pain (pain score 7-10) or moderate pain (pain score 4-6).      HYDROmorphone  (DILAUDID ) 2 MG tablet Take 4 mg by mouth every 4 (four) hours as needed for severe pain (pain score 7-10).      leptospermum manuka honey (MEDIHONEY) PSTE paste Apply 1 Application topically daily. 30 mL 0    losartan  (COZAAR ) 100 MG tablet TAKE 1 TABLET EVERY DAY 90 tablet 3    methocarbamol  (ROBAXIN ) 500 MG tablet Take 500 mg by mouth every 6 (six) hours as needed for muscle spasms.      metoprolol   succinate (TOPROL -XL) 25 MG 24 hr tablet Take 3 tablets (75 mg total) by mouth daily. 90 tablet 0    nitroGLYCERIN  (NITROSTAT ) 0.4 MG SL tablet Place 0.4 mg under the tongue every 5 (five) minutes as needed for chest pain.      ondansetron  (ZOFRAN ) 4 MG/2ML SOLN injection Inject 2 mLs (4 mg total) into the vein every 6 (six) hours as needed for nausea or vomiting.      pantoprazole  (PROTONIX ) 40 MG tablet Take 1 tablet (40 mg total) by mouth 2 (two) times daily. 60 tablet 0    Scheduled:  Infusions:   sodium chloride  75 mL/hr at 04/14/24 2031   heparin      piperacillin -tazobactam (ZOSYN )  IV 3.375 g (04/14/24 2127)   [START ON 04/15/2024] piperacillin -tazobactam (ZOSYN )  IV      Assessment: 78 yo female to start heparin  drip for Afib (on apixaban  PTA?  and likely needs procedure).PMH of hypertension, COPD, CAD, DES placement, diastolic CHF, A-fib on Eliquis , depression  with anxiety, CKD-3B, PSVT, chronic pain, left bundle blockade, spinal stenosis, breast cancer metastasized to bone, left renal carcinoma, acute cholangitis and obstructive jaundice (s/p of ERCP)  Baseline: hgb 9.8  Plt 93  aPTT 39  HL 0.44     INR 1.2 Per RN: It appears her last dose of apixaban  was 5/13 (at home per son prior to going to [facility] ED).  She transferred from [facility] ED to [facility] 5/15.  Goal of Therapy:  Heparin  level 0.3-0.7 units/ml aPTT 66-102  Monitor platelets by anticoagulation protocol: Yes   Plan:  No bolus as unsure of last apixaban  dose Start heparin  infusion at 1000 units/hr Check anti-Xa level in 8 hours and daily while on heparin  Continue to monitor H&H and platelets Monitor HL and aPTT until correlating  Thomasine Flick PharmD Clinical Pharmacist 04/14/2024

## 2024-04-14 NOTE — Progress Notes (Signed)
 Pt arrived via EMS to floor. Placed in 253, provided call bell, bed locked and low, bed alarm on, possessions in reach. Educated to call for assistance. VSS at this time. Pt does have very cold fingers, blue on presentation. Pt states this is normal for her. Was able to get pulse ox reading on forehead. Pt placed on telemetry. Pt is alert and oriented at this time, however, per report pt does get confused at night. Pt requesting to stay in her own gown at this time. Purewick in place from previous facility due to her being bedbound at baseline. Notified Dr Rosalea Collin via epic chat. He has placed orders and will come see patient. See orders. Pt is NPO at this time.

## 2024-04-14 NOTE — Progress Notes (Signed)
   04/14/24 1852  Pressure Injury 03/09/24 Sacrum Stage 2 -  Partial thickness loss of dermis presenting as a shallow open injury with a red, pink wound bed without slough.  Date First Assessed/Time First Assessed: 03/09/24 0643   Location: Sacrum  Staging: Stage 2 -  Partial thickness loss of dermis presenting as a shallow open injury with a red, pink wound bed without slough.  Present on Admission: Yes  Wound Image   Dressing Type Foam - Lift dressing to assess site every shift  Dressing Clean, Dry, Intact  Dressing Change Frequency Every 3 days  Site / Wound Assessment Bleeding  Peri-wound Assessment Intact   Present on admission. Pt states it has been there "a while".

## 2024-04-15 ENCOUNTER — Other Ambulatory Visit: Payer: Self-pay

## 2024-04-15 ENCOUNTER — Inpatient Hospital Stay: Admitting: Certified Registered"

## 2024-04-15 ENCOUNTER — Inpatient Hospital Stay

## 2024-04-15 ENCOUNTER — Encounter
Admission: AD | Disposition: A | Discharge status: Home / Self Care | Payer: Self-pay | Source: Ambulatory Visit | Attending: Internal Medicine

## 2024-04-15 ENCOUNTER — Encounter: Payer: Self-pay | Admitting: Internal Medicine

## 2024-04-15 DIAGNOSIS — K8309 Other cholangitis: Secondary | ICD-10-CM | POA: Diagnosis not present

## 2024-04-15 HISTORY — PX: ERCP: SHX5425

## 2024-04-15 LAB — GLUCOSE, CAPILLARY
Glucose-Capillary: 38 mg/dL — CL (ref 70–99)
Glucose-Capillary: 54 mg/dL — ABNORMAL LOW (ref 70–99)
Glucose-Capillary: 73 mg/dL (ref 70–99)
Glucose-Capillary: 93 mg/dL (ref 70–99)
Glucose-Capillary: 42 mg/dL — CL (ref 70–99)
Glucose-Capillary: 46 mg/dL — ABNORMAL LOW (ref 70–99)
Glucose-Capillary: 83 mg/dL (ref 70–99)
Glucose-Capillary: 84 mg/dL (ref 70–99)
Glucose-Capillary: 91 mg/dL (ref 70–99)

## 2024-04-15 LAB — COMPREHENSIVE METABOLIC PANEL WITH GFR
ALT: 64 U/L — ABNORMAL HIGH (ref 0–44)
AST: 190 U/L — ABNORMAL HIGH (ref 15–41)
Albumin: 1.9 g/dL — ABNORMAL LOW (ref 3.5–5.0)
Alkaline Phosphatase: 611 U/L — ABNORMAL HIGH (ref 38–126)
Anion gap: 13 (ref 5–15)
BUN: 30 mg/dL — ABNORMAL HIGH (ref 8–23)
CO2: 18 mmol/L — ABNORMAL LOW (ref 22–32)
Calcium: 7.2 mg/dL — ABNORMAL LOW (ref 8.9–10.3)
Chloride: 108 mmol/L (ref 98–111)
Creatinine, Ser: 1 mg/dL (ref 0.44–1.00)
GFR, Estimated: 58 mL/min — ABNORMAL LOW (ref >60)
Glucose, Bld: 87 mg/dL (ref 70–99)
Potassium: 3.9 mmol/L (ref 3.5–5.1)
Sodium: 139 mmol/L (ref 135–145)
Total Bilirubin: 8.4 mg/dL — ABNORMAL HIGH (ref 0.0–1.2)
Total Protein: 6 g/dL — ABNORMAL LOW (ref 6.5–8.1)

## 2024-04-15 LAB — CBC
HCT: 31.8 % — ABNORMAL LOW (ref 36.0–46.0)
Hemoglobin: 10.2 g/dL — ABNORMAL LOW (ref 12.0–15.0)
MCH: 31.6 pg (ref 26.0–34.0)
MCHC: 32.1 g/dL (ref 30.0–36.0)
MCV: 98.5 fL (ref 80.0–100.0)
Platelets: 90 10*3/uL — ABNORMAL LOW (ref 150–400)
RBC: 3.23 MIL/uL — ABNORMAL LOW (ref 3.87–5.11)
RDW: 20.6 % — ABNORMAL HIGH (ref 11.5–15.5)
WBC: 20.5 10*3/uL — ABNORMAL HIGH (ref 4.0–10.5)
nRBC: 0 % (ref 0.0–0.2)

## 2024-04-15 LAB — TECHNOLOGIST SMEAR REVIEW: Plt Morphology: NORMAL

## 2024-04-15 LAB — HEPARIN LEVEL (UNFRACTIONATED)
Heparin Unfractionated: 0.84 [IU]/mL — ABNORMAL HIGH (ref 0.30–0.70)
Heparin Unfractionated: 0.92 [IU]/mL — ABNORMAL HIGH (ref 0.30–0.70)

## 2024-04-15 LAB — MRSA NEXT GEN BY PCR, NASAL: MRSA by PCR Next Gen: DETECTED — AB

## 2024-04-15 LAB — APTT: aPTT: 130 s — ABNORMAL HIGH (ref 24–36)

## 2024-04-15 SURGERY — ERCP, WITH INTERVENTION IF INDICATED
Anesthesia: General

## 2024-04-15 MED ORDER — DICLOFENAC SUPPOSITORY 100 MG
100.0000 mg | Freq: Once | RECTAL | Status: AC
  Administered 2024-04-15: 100 mg via RECTAL

## 2024-04-15 MED ORDER — ROCURONIUM BROMIDE 100 MG/10ML IV SOLN
INTRAVENOUS | Status: DC | PRN
  Administered 2024-04-15: 30 mg via INTRAVENOUS

## 2024-04-15 MED ORDER — GLYCOPYRROLATE 0.2 MG/ML IJ SOLN
INTRAMUSCULAR | Status: AC
  Filled 2024-04-15: qty 1

## 2024-04-15 MED ORDER — DEXAMETHASONE SODIUM PHOSPHATE 10 MG/ML IJ SOLN
INTRAMUSCULAR | Status: DC | PRN
  Administered 2024-04-15: 5 mg via INTRAVENOUS

## 2024-04-15 MED ORDER — PROPOFOL 10 MG/ML IV BOLUS
INTRAVENOUS | Status: AC
  Filled 2024-04-15: qty 20

## 2024-04-15 MED ORDER — DEXTROSE 50 % IV SOLN
1.0000 | Freq: Once | INTRAVENOUS | Status: AC
  Administered 2024-04-15: 50 mL via INTRAVENOUS
  Filled 2024-04-15: qty 50

## 2024-04-15 MED ORDER — DICLOFENAC SUPPOSITORY 100 MG
100.0000 mg | Freq: Once | RECTAL | Status: DC
Start: 2024-04-15 — End: 2024-04-17

## 2024-04-15 MED ORDER — FENTANYL CITRATE (PF) 100 MCG/2ML IJ SOLN
INTRAMUSCULAR | Status: AC
  Filled 2024-04-15: qty 2

## 2024-04-15 MED ORDER — PHENYLEPHRINE 80 MCG/ML (10ML) SYRINGE FOR IV PUSH (FOR BLOOD PRESSURE SUPPORT)
PREFILLED_SYRINGE | INTRAVENOUS | Status: DC | PRN
  Administered 2024-04-15 (×4): 80 ug via INTRAVENOUS

## 2024-04-15 MED ORDER — DICLOFENAC SUPPOSITORY 100 MG
RECTAL | Status: AC
  Filled 2024-04-15: qty 1

## 2024-04-15 MED ORDER — DM-GUAIFENESIN ER 30-600 MG PO TB12
1.0000 | ORAL_TABLET | Freq: Two times a day (BID) | ORAL | Status: DC | PRN
  Administered 2024-04-17: 1 via ORAL
  Filled 2024-04-15 (×2): qty 1

## 2024-04-15 MED ORDER — PROPOFOL 10 MG/ML IV BOLUS
INTRAVENOUS | Status: DC | PRN
  Administered 2024-04-15: 100 mg via INTRAVENOUS

## 2024-04-15 MED ORDER — FENTANYL CITRATE (PF) 100 MCG/2ML IJ SOLN
INTRAMUSCULAR | Status: DC | PRN
  Administered 2024-04-15: 50 ug via INTRAVENOUS

## 2024-04-15 MED ORDER — HEPARIN (PORCINE) 25000 UT/250ML-% IV SOLN
600.0000 [IU]/h | INTRAVENOUS | Status: DC
Start: 2024-04-15 — End: 2024-04-18
  Administered 2024-04-15: 1000 [IU]/h via INTRAVENOUS
  Administered 2024-04-16 – 2024-04-18 (×2): 600 [IU]/h via INTRAVENOUS
  Filled 2024-04-15 (×2): qty 250

## 2024-04-15 MED ORDER — EPHEDRINE SULFATE-NACL 50-0.9 MG/10ML-% IV SOSY
PREFILLED_SYRINGE | INTRAVENOUS | Status: DC | PRN
  Administered 2024-04-15: 5 mg via INTRAVENOUS

## 2024-04-15 MED ORDER — SUGAMMADEX SODIUM 200 MG/2ML IV SOLN
INTRAVENOUS | Status: DC | PRN
  Administered 2024-04-15: 177.4 mg via INTRAVENOUS

## 2024-04-15 MED ORDER — ONDANSETRON HCL 4 MG/2ML IJ SOLN
INTRAMUSCULAR | Status: DC | PRN
  Administered 2024-04-15: 4 mg via INTRAVENOUS

## 2024-04-15 MED ORDER — LIDOCAINE HCL (CARDIAC) PF 100 MG/5ML IV SOSY
PREFILLED_SYRINGE | INTRAVENOUS | Status: DC | PRN
  Administered 2024-04-15: 80 mg via INTRAVENOUS

## 2024-04-15 NOTE — Anesthesia Preprocedure Evaluation (Signed)
 Anesthesia Evaluation  Patient identified by MRN, date of birth, ID band Patient awake    Reviewed: Allergy & Precautions, NPO status , Patient's Chart, lab work & pertinent test results  Airway Mallampati: III  TM Distance: >3 FB Neck ROM: full    Dental  (+) Upper Dentures, Partial Lower   Pulmonary neg pulmonary ROS, shortness of breath, sleep apnea , COPD, former smoker   Pulmonary exam normal  + decreased breath sounds      Cardiovascular Exercise Tolerance: Poor hypertension, Pt. on medications + CAD, + Past MI, + Cardiac Stents and +CHF  negative cardio ROS Normal cardiovascular exam+ dysrhythmias  Rhythm:Regular Rate:Normal     Neuro/Psych  PSYCHIATRIC DISORDERS Anxiety Depression Bipolar Disorder    Neuromuscular disease negative neurological ROS  negative psych ROS   GI/Hepatic negative GI ROS, Neg liver ROS,,,  Endo/Other  negative endocrine ROS  Class 4 obesity  Renal/GU Renal InsufficiencyRenal disease  negative genitourinary   Musculoskeletal   Abdominal  (+) + obese  Peds negative pediatric ROS (+)  Hematology negative hematology ROS (+) Blood dyscrasia, anemia   Anesthesia Other Findings Past Medical History: No date: CAD in native artery     Comment:  a. DES to ramus 2005 with late stent thrombosis 2006 tx               with PTCA. 12/18 PCI/DES x1 to mRCA, EF 50-55% No date: CHF (congestive heart failure) (HCC) No date: Chronic pain No date: COPD (chronic obstructive pulmonary disease) (HCC) No date: Hyperlipidemia No date: Hypertension No date: Left bundle branch block No date: Lymphedema No date: Metastatic breast cancer     Comment:  a. to bone. No date: MI (myocardial infarction) (HCC) 10/2017: Mild aortic stenosis No date: Morbid obesity (HCC) No date: PAF (paroxysmal atrial fibrillation) (HCC) No date: PSVT (paroxysmal supraventricular tachycardia) (HCC)     Comment:  a. per [facility]  notes, seen on event monitor in 2014. No date: Pulmonary nodules  Past Surgical History: 03/09/2024: BILIARY STENT PLACEMENT     Comment:  Procedure: INSERTION, STENT, BILE DUCT;  Surgeon: Marnee Sink, MD;  Location: [facility] ENDOSCOPY;  Service:               Endoscopy;; 03/29/2021: CHOLECYSTECTOMY; N/A     Comment:  Procedure: LAPAROSCOPIC CHOLECYSTECTOMY;  Surgeon:               Awilda Bogus, MD;  Location: [facility]S;  Service:               General;  Laterality: N/A; 11/26/2017: CORONARY STENT INTERVENTION; N/A     Comment:  Procedure: CORONARY STENT INTERVENTION;  Surgeon:               Swaziland, Peter M, MD;  Location: [facility];                Service: Cardiovascular;  Laterality: N/A; No date: CORONARY STENT PLACEMENT 03/28/2021: ERCP; N/A     Comment:  Procedure: ENDOSCOPIC RETROGRADE               CHOLANGIOPANCREATOGRAPHY (ERCP);  Surgeon: Ruby Corporal, MD;  Location: [facility];  Service: Endoscopy;                Laterality: N/A; 03/09/2024: ERCP; N/A  Comment:  Procedure: ERCP, WITH INTERVENTION IF INDICATED;                Surgeon: Marnee Sink, MD;  Location: [facility] ENDOSCOPY;                Service: Endoscopy;  Laterality: N/A; 03/09/2024: ESOPHAGOGASTRODUODENOSCOPY     Comment:  Procedure: EGD (ESOPHAGOGASTRODUODENOSCOPY);  Surgeon:               Marnee Sink, MD;  Location: [facility] ENDOSCOPY;  Service:               Endoscopy;; No date: intrathecal pain pump 11/26/2017: LEFT HEART CATH AND CORONARY ANGIOGRAPHY; N/A     Comment:  Procedure: LEFT HEART CATH AND CORONARY ANGIOGRAPHY;                Surgeon: Swaziland, Peter M, MD;  Location: [facility] INV[facility] CV               LAB;  Service: Cardiovascular;  Laterality: N/A; 03/28/2021: REMOVAL OF STONES     Comment:  Procedure: REMOVAL OF STONES;  Surgeon: Ruby Corporal, MD;  Location: [facility];  Service: Endoscopy;; 03/28/2021: SPHINCTEROTOMY     Comment:  Procedure:  SPHINCTEROTOMY;  Surgeon: Ruby Corporal,               MD;  Location: [facility];  Service: Endoscopy;; 04/03/2021: UMBILICAL HERNIA REPAIR; N/A     Comment:  Procedure: ADULT PRIMARY UMBILICAL HERNIA REPAIR;                Surgeon: Awilda Bogus, MD;  Location: [facility];                Service: General;  Laterality: N/A; No date: VASCULAR SURGERY  BMI    Body Mass Index: 38.19 kg/m      Reproductive/Obstetrics negative OB ROS                             Anesthesia Physical Anesthesia Plan  ASA: 4  Anesthesia Plan: General   Post-op Pain Management:    Induction: Intravenous  PONV Risk Score and Plan: Ondansetron , Dexamethasone , Midazolam  and Treatment may vary due to age or medical condition  Airway Management Planned: Oral ETT  Additional Equipment:   Intra-op Plan:   Post-operative Plan: Extubation in OR  Informed Consent: I have reviewed the patients History and Physical, chart, labs and discussed the procedure including the risks, benefits and alternatives for the proposed anesthesia with the patient or authorized representative who has indicated his/her understanding and acceptance.     Dental Advisory Given  Plan Discussed with: CRNA  Anesthesia Plan Comments:        Anesthesia Quick Evaluation

## 2024-04-15 NOTE — Plan of Care (Signed)

## 2024-04-15 NOTE — Op Note (Signed)
 [facility] Gastroenterology Patient Name: Stacey  [name] Procedure Date: 04/15/2024 12:43 PM MRN: [id number] Account #: [id number] Date of Birth: March 27, 1946 Admit Type: Inpatient Age: 78 Room: [facility] ENDO ROOM 4 Gender: Female Note Status: Finalized Instrument Name: Jhonnie Mosher [id number] Procedure:             ERCP Indications:           Ascending cholangitis Providers:             Marnee Sink MD, MD Referring MD:          Leontine Rana. Crihfield (Referring MD), [name]                         (Referring MD) Medicines:             General Anesthesia Complications:         No immediate complications. Procedure:             Pre-Anesthesia Assessment:                        - Prior to the procedure, a History and Physical was                         performed, and patient medications and allergies were                         reviewed. The patient's tolerance of previous                         anesthesia was also reviewed. The risks and benefits                         of the procedure and the sedation options and risks                         were discussed with the patient. All questions were                         answered, and informed consent was obtained. Prior                         Anticoagulants: The patient has taken no anticoagulant                         or antiplatelet agents. ASA Grade Assessment: II - A                         patient with mild systemic disease. After reviewing                         the risks and benefits, the patient was deemed in                         satisfactory condition to undergo the procedure.                        After obtaining informed consent, the scope was passed  under direct vision. Throughout the procedure, the                         patient's blood pressure, pulse, and oxygen                          saturations were monitored continuously. The                         Duodenoscope was  introduced through the mouth, and                         used to inject contrast into and used to inject                         contrast into the bile duct. The ERCP was accomplished                         without difficulty. The patient tolerated the                         procedure well. Findings:      A biliary stent was visible on the scout film. One stent was removed       from the common bile duct using a snare. The biliary tree was swept with       a 15 mm balloon starting at the bifurcation. Pus was swept from the       duct. One 10 Fr by 6 cm covered metal stent was placed into the common       bile duct. The stent was in good position. Cells for cytology were       obtained by brushing in the lower third of the main bile duct. Impression:            - One stent was removed from the common bile duct.                        - The biliary tree was swept and pus was found.                        - One covered metal stent was placed into the common                         bile duct.                        - Cells for cytology obtained in the lower third of                         the main duct. Recommendation:        - Return patient to hospital ward for ongoing care.                        - Resume regular diet.                        - Watch for pancreatitis, bleeding, perforation, and  cholangitis.                        - Await cytology results. Procedure Code(s):     --- Professional ---                        [phone], Endoscopic retrograde cholangiopancreatography                         (ERCP); with removal and exchange of stent(s), biliary                         or pancreatic duct, including pre- and post-dilation                         and guide wire passage, when performed, including                         sphincterotomy, when performed, each stent exchanged Diagnosis Code(s):     --- Professional ---                        K83.09, Other  cholangitis CPT copyright 2022 American [employer] Association. All rights reserved. The codes documented in this report are preliminary and upon coder review may  be revised to meet current compliance requirements. Marnee Sink MD, MD 04/15/2024 1:22:09 PM This report has been signed electronically. Number of Addenda: 0 Note Initiated On: 04/15/2024 12:43 PM Estimated Blood Loss:  Estimated blood loss: none.      [facility]

## 2024-04-15 NOTE — Transfer of Care (Signed)
 Immediate Anesthesia Transfer of Care Note  Patient: Stacey  B [name]  Procedure(s) Performed: ERCP, WITH INTERVENTION IF INDICATED  Patient Location: PACU  Anesthesia Type:General  Level of Consciousness: drowsy  Airway & Oxygen  Therapy: Patient Spontanous Breathing and Patient connected to nasal cannula oxygen   Post-op Assessment: Report given to RN and Post -op Vital signs reviewed and stable  Post vital signs: Reviewed and stable  Last Vitals:  Vitals Value Taken Time  BP 133/64   Temp    Pulse 102   Resp 12 04/15/24 1332  SpO2 94   Vitals shown include unfiled device data.  Last Pain:  Vitals:   04/15/24 1326  TempSrc: Temporal  PainSc: Asleep      Patients Stated Pain Goal: 3 (04/14/24 2030)  Complications: No notable events documented.

## 2024-04-15 NOTE — Progress Notes (Signed)
 PHARMACY - ANTICOAGULATION CONSULT NOTE  Pharmacy Consult for heparin  drip Indication: atrial fibrillation  Allergies  Allergen Reactions   Brilinta  [Ticagrelor ] Diarrhea and Nausea Only    Nausea and severe diarrhea- general weakness. Patient says does not want to take again   Budesonide  Palpitations   Albuterol  Other (See Comments)    Heart racing     Patient Measurements: Height: 5' (152.4 cm) Weight: 88.7 kg (195 lb 8.8 oz) IBW/kg (Calculated) : 45.5 HEPARIN  DW (KG): 66.1  Vital Signs: Temp: 97.7 F (36.5 C) (05/16 1954) Temp Source: Oral (05/16 1954) BP: 129/90 (05/16 1932) Pulse Rate: 103 (05/16 1932)  Labs: Recent Labs    04/14/24 1912 04/14/24 2159 04/15/24 0444 04/15/24 0711 04/15/24 2228  HGB 9.8*  --  10.2*  --   --   HCT 30.7*  --  31.8*  --   --   PLT 93*  --  90*  --   --   APTT 39*  --   --  130* >200*  LABPROT 15.8*  --   --   --   --   INR 1.2  --   --   --   --   HEPARINUNFRC  --  0.44  --  0.84* 0.92*  CREATININE 1.06*  --   --  1.00  --     Estimated Creatinine Clearance: 46.7 mL/min (by C-G formula based on SCr of 1 mg/dL).   Medical History: Past Medical History:  Diagnosis Date   CAD in native artery    a. DES to ramus 2005 with late stent thrombosis 2006 tx with PTCA. 12/18 PCI/DES x1 to mRCA, EF 50-55%   CHF (congestive heart failure) (HCC)    Chronic pain    COPD (chronic obstructive pulmonary disease) (HCC)    Hyperlipidemia    Hypertension    Left bundle branch block    Lymphedema    Metastatic breast cancer    a. to bone.   MI (myocardial infarction) (HCC)    Mild aortic stenosis 10/2017   Morbid obesity (HCC)    PAF (paroxysmal atrial fibrillation) (HCC)    PSVT (paroxysmal supraventricular tachycardia) (HCC)    a. per [facility] notes, seen on event monitor in 2014.   Pulmonary nodules     Medications:  Medications Prior to Admission  Medication Sig Dispense Refill Last Dose/Taking   amLODipine  (NORVASC ) 5 MG tablet  TAKE 1 TABLET EVERY DAY 90 tablet 3 04/14/2024   atorvastatin  (LIPITOR ) 80 MG tablet Take 80 mg by mouth daily.   04/14/2024   b complex vitamins capsule Take 1 capsule by mouth daily.   04/14/2024   carboxymethylcellulose (REFRESH PLUS) 0.5 % SOLN Place 1 drop into both eyes 3 (three) times daily as needed (Dry Eyes).   Taking As Needed   clopidogrel  (PLAVIX ) 75 MG tablet Take 75 mg by mouth daily.   04/14/2024   diclofenac  Sodium (VOLTAREN ) 1 % GEL Apply topically.   Taking   DULoxetine  (CYMBALTA ) 30 MG capsule Take 30 mg by mouth daily.   04/14/2024   ferrous sulfate 325 (65 FE) MG EC tablet Take 325 mg by mouth 3 (three) times daily with meals.   04/14/2024   HYDROmorphone  (DILAUDID ) 1 MG/ML injection Inject 0.5 mLs (0.5 mg total) into the vein every 2 (two) hours as needed for severe pain (pain score 7-10) or moderate pain (pain score 4-6).   Taking As Needed   HYDROmorphone  (DILAUDID ) 2 MG tablet Take 4 mg by mouth  every 4 (four) hours as needed for severe pain (pain score 7-10).   Taking As Needed   leptospermum manuka honey (MEDIHONEY) PSTE paste Apply 1 Application topically daily. 30 mL 0 04/14/2024   losartan  (COZAAR ) 100 MG tablet TAKE 1 TABLET EVERY DAY 90 tablet 3 04/14/2024   methocarbamol  (ROBAXIN ) 500 MG tablet Take 500 mg by mouth every 6 (six) hours as needed for muscle spasms.   Taking As Needed   metoprolol  succinate (TOPROL -XL) 25 MG 24 hr tablet Take 3 tablets (75 mg total) by mouth daily. 90 tablet 0 04/14/2024   nitroGLYCERIN  (NITROSTAT ) 0.4 MG SL tablet Place 0.4 mg under the tongue every 5 (five) minutes as needed for chest pain.   Taking As Needed   ondansetron  (ZOFRAN ) 4 MG/2ML SOLN injection Inject 2 mLs (4 mg total) into the vein every 6 (six) hours as needed for nausea or vomiting.   Taking As Needed   pantoprazole  (PROTONIX ) 40 MG tablet Take 1 tablet (40 mg total) by mouth 2 (two) times daily. 60 tablet 0 04/14/2024   apixaban  (ELIQUIS ) 5 MG TABS tablet Take 5 mg by mouth 2  (two) times daily. (Patient not taking: Reported on 04/15/2024)   Not Taking   Scheduled:   amLODipine   5 mg Oral Daily   diclofenac   100 mg Rectal Once   DULoxetine   30 mg Oral Daily   ferrous sulfate  325 mg Oral TID WC   losartan   100 mg Oral Daily   metoprolol  succinate  75 mg Oral Daily   pantoprazole   40 mg Oral BID   Infusions:   heparin  1,000 Units/hr (04/15/24 1426)   piperacillin -tazobactam (ZOSYN )  IV 3.375 g (04/15/24 2048)    Assessment: 78 yo female to start heparin  drip for Afib (on apixaban  PTA?  and likely needs procedure).PMH of hypertension, COPD, CAD, DES placement, diastolic CHF, A-fib on Eliquis , depression with anxiety, CKD-3B, PSVT, chronic pain, left bundle blockade, spinal stenosis, breast cancer metastasized to bone, left renal carcinoma, acute cholangitis and obstructive jaundice (s/p of ERCP)  Baseline: hgb 9.8  Plt 93  aPTT 39  HL 0.44     INR 1.2 Per RN: It appears her last dose of apixaban  was 5/13 (at home per son prior to going to [facility] ED).  She transferred from [facility] ED to [facility] 5/15.  Goal of Therapy:  Heparin  level 0.3-0.7 units/ml aPTT 66-102  Monitor platelets by anticoagulation protocol: Yes  Date/Time: aPTT/HL: Comment/Rate: 5/16@0711  130/0.84 SUPRAtherapeutic@1000units /hr 5/16 2228 >200/0.92 Supratherapeutic  **heparin  held d/t ERCP**   Plan:  Decrease heparin  infusion to 800 units/hr Recheck HL level in 8 hours after rate change Continue to monitor H&H and platelets  Coretta Dexter, PharmD, [facility] 04/15/2024 11:37 PM

## 2024-04-15 NOTE — Progress Notes (Signed)
 This patient was transferred overnight from an outside institution due to cholangitis.  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  The patient has a complicated past medical history and please see my previous consult note from last month.  The history does include COPD with coronary artery disease with A-fib on Eliquis  with a history of spinal stenosis and metastatic breast cancer.  The patient now has jaundice with an elevated white cell count and suspected cholangitis.  The patient will be set up for an ERCP for today. The patient's heparin  has been discontinued in anticipation for the procedure today.

## 2024-04-15 NOTE — Anesthesia Procedure Notes (Signed)
 Procedure Name: Intubation Date/Time: 04/15/2024 12:47 PM  Performed by: Clementine Cutting, CRNAPre-anesthesia Checklist: Patient identified, Patient being monitored, Timeout performed, Emergency Drugs available and Suction available Patient Re-evaluated:Patient Re-evaluated prior to induction Oxygen  Delivery Method: Circle system utilized Preoxygenation: Pre-oxygenation with 100% oxygen  Induction Type: IV induction Ventilation: Mask ventilation without difficulty Laryngoscope Size: 3 and McGrath Grade View: Grade I Tube type: Oral Tube size: 7.0 mm Number of attempts: 1 Airway Equipment and Method: Stylet and Video-laryngoscopy Placement Confirmation: ETT inserted through vocal cords under direct vision, positive ETCO2 and breath sounds checked- equal and bilateral Secured at: 21 cm Tube secured with: Tape Dental Injury: Teeth and Oropharynx as per pre-operative assessment

## 2024-04-15 NOTE — Progress Notes (Signed)
 PROGRESS NOTE    Stacey [name]  [name]  ZOX:[id number] DOB: 1946-11-17 DOA: 04/14/2024 PCP: [name], MD    Brief Narrative:  78 y.o. female with medical history significant of hypertension, COPD, CAD, DES placement, diastolic CHF, A-fib on Eliquis , depression with anxiety, CKD-3B, PSVT, chronic pain, left bundle blockade, spinal stenosis, breast cancer metastasized to bone, left renal carcinoma, acute cholangitis and obstructive jaundice (s/p of ERCP), who presented to [facility] due to nausea, vomiting, right upper quadrant abdominal pain. Pt was accepted for transferring from [facility] per Dr. Isom Marin of [facility] ED on 5/13, arrived today.     Patient was recently hospitalized to [facility] on 4/9 - 4/18 due to acute cholangitis and obstructive jaundice.  Patient is s/p of ERCP, found to have duodenal ulcer, duodenal and sphincter stenosis.  Stent was placed and the purulent discharge was removed.    Patient states that she has nausea, multiple episodes of nonbilious nonbloody vomiting and abdominal pain in the past several days.  No fever or chills.  Abdominal pain is located in the upper abdomen, constant, mild to moderate, aching, nonradiating.  No diarrhea.  No fever or chills.  She states she had some chest discomfort yesterday, which has resolved, currently no active chest pain.  She has mild dry cough, mild SOB due to COPD.  No symptoms of UTI.   CTA of abdomen/pelvis done in [facility] showed severe intra- and extrahepatic biliary ductal dilation.  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  Common bile duct diffuse mucosa hyperenhancement, nonspecific, but can be seen with ascending cholangitis.  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  Osseous metastases not significantly changed.   Liver function with ALP 895, AST 135, ALT 41, total bilirubin 7.1, direct bilirubin 6.09.  Renal function stable, WBC 7.2, INR 1.23.  Temperature normal,  initial blood pressure low with SBP in 80s, which improved to 109/63 after 30 cc/kg normal saline resuscitation.  Heart rate 100, RR 15, oxygen  saturation 99% on room air.  Chest x-ray negative for infiltration. Pt was given Zosyn  in ED.   Dr. Isom Marin of ED in [facility] consulted Dr. Bridgett Camps of GI who reached to Dr. Ole Berkeley of [facility] GI. Dr. Ole Berkeley agreed to see pt after arrival to [facility]. Pt is accepted to PCU as inpt.  5/16: ERCP today  Assessment & Plan:   Principal Problem:   Ascending cholangitis Active Problems:   Biliary obstruction   Atrial fibrillation, chronic (HCC)   CAD (coronary artery disease)   HLD (hyperlipidemia)   Chronic diastolic CHF (congestive heart failure) (HCC)   Chronic kidney disease, stage 3b (HCC)   CAD in native artery   COPD (chronic obstructive pulmonary disease) (HCC)   HTN (hypertension)   Thrombocytopenia (HCC)   Anxiety and depression   Bipolar 2 disorder, major depressive episode (HCC)   Obesity, Class II, BMI 35-39.9  Ascending cholangitis and biliary obstruction:  Patient has WBC 23.6, no fever, clinically does not seem to have sepsis. GI consulted on admission Plan: ER CP today Continue Zosyn  Daily CMP IV fluids Pain control   Atrial fibrillation, chronic (HCC):  Eliquis  held On IV heparin , currently hold for procedure Continue metoprolol  Likely restart anticoagulation 5/17   CAD (coronary artery disease): Patent with no chest pain Plavix  and Lipitor  currently on hold.  Restart as appropriate   HLD (hyperlipidemia) Lipitor  on hold in the setting of LFT elevation   Chronic diastolic CHF (congestive heart failure) (  HCC): 2D echo 03/25/21 showed EF of 50-55% with grade 1 diastolic dysfunction.  BNP is elevated 406, but patient does not have leg edema or JVD.  Does not seem to have CHF exacerbation. Continue monitoring volume status   Chronic kidney disease, stage 3b [facility]): Renal function stable.  Recent baseline creatinine 1.45 on 03/23/2024.   Her creatinine is 1.06, BUN 32. Creatinine at baseline.  Continue to monitor   COPD (chronic obstructive pulmonary disease) (HCC): Stable Continue as needed bronchodilators and Mucinex    HTN (hypertension) Continue home amlodipine , Cozaar , metoprolol .  Continue IV hydralazine  as needed   Thrombocytopenia (HCC): Platelet 93.   Appears mildly chronic.  No bleeding noted.  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  Continue outpatient monitoring  DVT prophylaxis: IV heparin , currently on hold Code Status: DNR Family Communication: None today Disposition Plan: Status is: Inpatient Remains inpatient appropriate because: Multiple acute issues as above   Level of care: Progressive  Consultants:  GI  Procedures:  ERCP  Antimicrobials: Zosyn    Subjective: Seen and examined.  Lying comfortably in bed.  No visible distress.  No complaints of pain.  Objective: Vitals:   04/15/24 0500 04/15/24 0823 04/15/24 1200 04/15/24 1326  BP:  (!) 144/63 135/68 (!) 145/60  Pulse:  98 60 93  Resp:  16 18 20   Temp:  97.9 F (36.6 C) (!) 96.9 F (36.1 C) (!) 97 F (36.1 C)  TempSrc:   Temporal Temporal  SpO2:  (!) 88% 91% 95%  Weight: 88.7 kg     Height:        Intake/Output Summary (Last 24 hours) at 04/15/2024 1331 Last data filed at 04/15/2024 1312 Gross per 24 hour  Intake --  Output 1001 ml  Net -1001 ml   Filed Weights   04/14/24 1759 04/15/24 0500  Weight: 87.5 kg 88.7 kg    Examination:  General exam: NAD Respiratory system: Clear to auscultation. Respiratory effort normal. Cardiovascular system: S1-S2, RRR, no murmurs, no pedal edema Gastrointestinal system: Obese, left, NT/ND, normal  bowel sounds. Central nervous system: Alert and oriented. No focal neurological deficits. Extremities: Symmetric 5 x 5 power. Skin: No rashes, lesions or ulcers Psychiatry: Judgement and insight appear normal. Mood & affect appropriate.     Data Reviewed: I have personally reviewed following labs and imaging studies  CBC: Recent Labs  Lab 04/14/24 1912 04/15/24 0444  WBC 23.6* 20.5*  NEUTROABS 22.2*  --   HGB 9.8* 10.2*  HCT 30.7* 31.8*  MCV 96.5 98.5  PLT 93* 90*   Basic Metabolic Panel: Recent Labs  Lab 04/14/24 1912 04/15/24 0711  NA 133* 139  K 3.4* 3.9  CL 103 108  CO2 19* 18*  GLUCOSE 122* 87  BUN 32* 30*  CREATININE 1.06* 1.00  CALCIUM  7.1* 7.2*   GFR: Estimated Creatinine Clearance: 46.7 mL/min (by C-G formula based on SCr of 1 mg/dL). Liver Function Tests: Recent Labs  Lab 04/14/24 1912 04/15/24 0711  AST 189* 190*  ALT 63* 64*  ALKPHOS 608* 611*  BILITOT 8.5* 8.4*  PROT 6.4* 6.0*  ALBUMIN  1.9* 1.9*   No results for input(s): "LIPASE", "AMYLASE" in the last 168 hours. No results for input(s): "AMMONIA" in  the last 168 hours. Coagulation Profile: Recent Labs  Lab 04/14/24 1912  INR 1.2   Cardiac Enzymes: No results for input(s): "CKTOTAL", "CKMB", "CKMBINDEX", "TROPONINI" in the last 168 hours. BNP (last 3 results) No results for input(s): "PROBNP" in the last 8760 hours. HbA1C: No results for input(s): "HGBA1C" in the last 72 hours. CBG: Recent Labs  Lab 04/15/24 0816 04/15/24 0817 04/15/24 0937  GLUCAP 38* 54* 73   Lipid Profile: No results for input(s): "CHOL", "HDL", "LDLCALC", "TRIG", "CHOLHDL", "LDLDIRECT" in the last 72 hours. Thyroid  Function Tests: No results for input(s): "TSH", "T4TOTAL", "FREET4", "T3FREE", "THYROIDAB" in the last 72 hours. Anemia Panel: No results for input(s): "VITAMINB12", "FOLATE", "FERRITIN", "TIBC", "IRON", "RETICCTPCT" in the last 72 hours. Sepsis Labs: No results for input(s): "PROCALCITON",  "LATICACIDVEN" in the last 168 hours.  Recent Results (from the past 240 hours)  Culture, blood (Routine X 2) w Reflex to ID Panel     Status: None (Preliminary result)   Collection Time: 04/14/24  7:12 PM   Specimen: BLOOD  Result Value Ref Range Status   Specimen Description BLOOD BLOOD LEFT HAND  Final   Special Requests   Final    BOTTLES DRAWN AEROBIC AND ANAEROBIC Blood Culture adequate volume   Culture   Final    NO GROWTH < 12 HOURS Performed at [facility], [address], [place], [place] [address]    Report Status PENDING  Incomplete  Culture, blood (Routine X 2) w Reflex to ID Panel     Status: None (Preliminary result)   Collection Time: 04/14/24  7:13 PM   Specimen: BLOOD  Result Value Ref Range Status   Specimen Description BLOOD BLOOD LEFT ARM  Final   Special Requests   Final    BOTTLES DRAWN AEROBIC AND ANAEROBIC Blood Culture adequate volume   Culture   Final    NO GROWTH < 12 HOURS Performed at [facility], [address] [place], [place] [address]    Report Status PENDING  Incomplete  MRSA Next Gen by PCR, Nasal     Status: Abnormal   Collection Time: 04/15/24  5:40 AM   Specimen: Nasal Mucosa; Nasal Swab  Result Value Ref Range Status   MRSA by PCR Next Gen DETECTED (A) NOT DETECTED Final    Comment: RESULT CALLED TO, READ BACK BY AND VERIFIED WITH: Diallo, Boubacar P, RN 04/15/24 0719 MW (NOTE) The GeneXpert MRSA Assay (FDA approved for NASAL specimens only), is one component of a comprehensive MRSA colonization surveillance program. It is not intended to diagnose MRSA infection nor to guide or monitor treatment for MRSA infections. Test performance is not FDA approved in patients less than 9 years old. Performed at [facility], [address] [place], [place] [address]          Radiology Studies: DG C-Arm 1-60 Min-No Report Result Date: 04/15/2024 Fluoroscopy was utilized by the requesting physician.  No  radiographic interpretation.        Scheduled Meds:  [MAR Hold] amLODipine   5 mg Oral Daily   [MAR Hold] diclofenac   100 mg Rectal Once   [MAR Hold] DULoxetine   30 mg Oral Daily   [MAR Hold] ferrous sulfate  325 mg Oral TID WC   [MAR Hold] losartan   100 mg Oral Daily   [MAR Hold] metoprolol  succinate  75 mg Oral Daily   [MAR Hold] pantoprazole   40 mg Oral BID   Continuous Infusions:  sodium chloride  300 mL/hr at 04/15/24  1314   [MAR Hold] piperacillin -tazobactam (ZOSYN )  IV 3.375 g (04/15/24 0434)     LOS: 1 day     Tiajuana Fluke, MD [facility]   If 7PM-7AM, please contact night-coverage  04/15/2024, 1:31 PM

## 2024-04-15 NOTE — Anesthesia Postprocedure Evaluation (Signed)
 Anesthesia Post Note  Patient: Stacey  B [name]  Procedure(s) Performed: ERCP, WITH INTERVENTION IF INDICATED  Patient location during evaluation: PACU Anesthesia Type: General Level of consciousness: awake Respiratory status: nonlabored ventilation Cardiovascular status: stable Anesthetic complications: no   No notable events documented.   Last Vitals:  Vitals:   04/15/24 1326 04/15/24 1336  BP: (!) 145/60 (!) 144/78  Pulse: 93 89  Resp: 20 17  Temp: (!) 36.1 C   SpO2: 95% 93%    Last Pain:  Vitals:   04/15/24 1336  TempSrc:   PainSc: 0-No pain                 VAN STAVEREN,Nicola Quesnell

## 2024-04-15 NOTE — Care Management Important Message (Signed)
 Important Message  Patient Details  Name: Stacey [name] MRN: [id number] Date of Birth: 07/12/1946   Important Message Given:  Yes - Medicare IM     Anise Kerns 04/15/2024, 1:22 PM

## 2024-04-15 NOTE — Progress Notes (Signed)
 PHARMACY - ANTICOAGULATION CONSULT NOTE  Pharmacy Consult for heparin  drip Indication: atrial fibrillation  Allergies  Allergen Reactions   Brilinta  [Ticagrelor ] Diarrhea and Nausea Only    Nausea and severe diarrhea- general weakness. Patient says does not want to take again   Budesonide  Palpitations   Albuterol  Other (See Comments)    Heart racing     Patient Measurements: Height: 5' (152.4 cm) Weight: 88.7 kg (195 lb 8.8 oz) IBW/kg (Calculated) : 45.5 HEPARIN  DW (KG): 66.1  Vital Signs: Temp: 97 F (36.1 C) (05/16 1326) Temp Source: Temporal (05/16 1326) BP: 144/78 (05/16 1336) Pulse Rate: 89 (05/16 1336)  Labs: Recent Labs    04/14/24 1912 04/14/24 2159 04/15/24 0444 04/15/24 0711  HGB 9.8*  --  10.2*  --   HCT 30.7*  --  31.8*  --   PLT 93*  --  90*  --   APTT 39*  --   --  130*  LABPROT 15.8*  --   --   --   INR 1.2  --   --   --   HEPARINUNFRC  --  0.44  --  0.84*  CREATININE 1.06*  --   --  1.00    Estimated Creatinine Clearance: 46.7 mL/min (by C-G formula based on SCr of 1 mg/dL).   Medical History: Past Medical History:  Diagnosis Date   CAD in native artery    a. DES to ramus 2005 with late stent thrombosis 2006 tx with PTCA. 12/18 PCI/DES x1 to mRCA, EF 50-55%   CHF (congestive heart failure) (HCC)    Chronic pain    COPD (chronic obstructive pulmonary disease) (HCC)    Hyperlipidemia    Hypertension    Left bundle branch block    Lymphedema    Metastatic breast cancer    a. to bone.   MI (myocardial infarction) (HCC)    Mild aortic stenosis 10/2017   Morbid obesity (HCC)    PAF (paroxysmal atrial fibrillation) (HCC)    PSVT (paroxysmal supraventricular tachycardia) (HCC)    a. per [facility] notes, seen on event monitor in 2014.   Pulmonary nodules     Medications:  Medications Prior to Admission  Medication Sig Dispense Refill Last Dose/Taking   amLODipine  (NORVASC ) 5 MG tablet TAKE 1 TABLET EVERY DAY 90 tablet 3 04/14/2024   apixaban   (ELIQUIS ) 5 MG TABS tablet Take 5 mg by mouth 2 (two) times daily.   04/14/2024   atorvastatin  (LIPITOR ) 80 MG tablet Take 80 mg by mouth daily.   04/14/2024   b complex vitamins capsule Take 1 capsule by mouth daily.   04/14/2024   carboxymethylcellulose (REFRESH PLUS) 0.5 % SOLN Place 1 drop into both eyes 3 (three) times daily as needed (Dry Eyes).   Taking As Needed   clopidogrel  (PLAVIX ) 75 MG tablet Take 75 mg by mouth daily.   04/14/2024   diclofenac  Sodium (VOLTAREN ) 1 % GEL Apply topically.   Taking   ferrous sulfate 325 (65 FE) MG EC tablet Take 325 mg by mouth 3 (three) times daily with meals.   04/14/2024   HYDROmorphone  (DILAUDID ) 1 MG/ML injection Inject 0.5 mLs (0.5 mg total) into the vein every 2 (two) hours as needed for severe pain (pain score 7-10) or moderate pain (pain score 4-6).   Taking As Needed   leptospermum manuka honey (MEDIHONEY) PSTE paste Apply 1 Application topically daily. 30 mL 0 04/14/2024   losartan  (COZAAR ) 100 MG tablet TAKE 1 TABLET EVERY DAY 90  tablet 3 04/14/2024   methocarbamol  (ROBAXIN ) 500 MG tablet Take 500 mg by mouth every 6 (six) hours as needed for muscle spasms.   Taking As Needed   metoprolol  succinate (TOPROL -XL) 25 MG 24 hr tablet Take 3 tablets (75 mg total) by mouth daily. 90 tablet 0 04/14/2024   nitroGLYCERIN  (NITROSTAT ) 0.4 MG SL tablet Place 0.4 mg under the tongue every 5 (five) minutes as needed for chest pain.   Taking As Needed   ondansetron  (ZOFRAN ) 4 MG/2ML SOLN injection Inject 2 mLs (4 mg total) into the vein every 6 (six) hours as needed for nausea or vomiting.   Taking As Needed   pantoprazole  (PROTONIX ) 40 MG tablet Take 1 tablet (40 mg total) by mouth 2 (two) times daily. 60 tablet 0 04/14/2024   DULoxetine  (CYMBALTA ) 30 MG capsule Take 30 mg by mouth daily.      HYDROmorphone  (DILAUDID ) 2 MG tablet Take 4 mg by mouth every 4 (four) hours as needed for severe pain (pain score 7-10).      Scheduled:   amLODipine   5 mg Oral Daily    diclofenac   100 mg Rectal Once   DULoxetine   30 mg Oral Daily   ferrous sulfate  325 mg Oral TID WC   losartan   100 mg Oral Daily   metoprolol  succinate  75 mg Oral Daily   pantoprazole   40 mg Oral BID   Infusions:   sodium chloride  300 mL/hr at 04/15/24 1314   heparin      piperacillin -tazobactam (ZOSYN )  IV 3.375 g (04/15/24 0434)    Assessment: 78 yo female to start heparin  drip for Afib (on apixaban  PTA?  and likely needs procedure).PMH of hypertension, COPD, CAD, DES placement, diastolic CHF, A-fib on Eliquis , depression with anxiety, CKD-3B, PSVT, chronic pain, left bundle blockade, spinal stenosis, breast cancer metastasized to bone, left renal carcinoma, acute cholangitis and obstructive jaundice (s/p of ERCP)  Baseline: hgb 9.8  Plt 93  aPTT 39  HL 0.44     INR 1.2 Per RN: It appears her last dose of apixaban  was 5/13 (at home per son prior to going to [facility] ED).  She transferred from [facility] ED to [facility] 5/15.  Goal of Therapy:  Heparin  level 0.3-0.7 units/ml aPTT 66-102  Monitor platelets by anticoagulation protocol: Yes  Date/Time: aPTT/HL: Comment/Rate: 5/16@0711  130/0.84 SUPRAtherapeutic@1000units /hr  **heparin  held d/t ERCP**   Plan:  No bolus initially after procedure, but okay to use nomogram thereafter Restart heparin  infusion at 1000 units/hr(won't adjust rate since infusion has already been held for multiple hours after the morning labs drawn) Check both aPTT and HL level in 8 hours after restart Monitor both levels until correlation seen, then transition to HL dosing only Continue to monitor H&H and platelets   Kailah Pennel A Malaky Tetrault, PharmD Clinical Pharmacist 04/15/2024 2:17 PM

## 2024-04-15 NOTE — Consult Note (Signed)
 VAT: Consult placed for PIV insertion  Pt was evaluated w/ 1x unsuccessful attempt in RUA. LUA has PIV in place. Recommendation for central line if PIV is needed in the future. Current PIV's are patent and working. Pt has trouble keeping arm straight during medication administration causing alaris pump to alarm.  Consult complete at this time.

## 2024-04-16 ENCOUNTER — Encounter: Payer: Self-pay | Admitting: Gastroenterology

## 2024-04-16 DIAGNOSIS — R748 Abnormal levels of other serum enzymes: Secondary | ICD-10-CM

## 2024-04-16 DIAGNOSIS — K8309 Other cholangitis: Secondary | ICD-10-CM

## 2024-04-16 LAB — COMPREHENSIVE METABOLIC PANEL WITH GFR
ALT: 59 U/L — ABNORMAL HIGH (ref 0–44)
AST: 163 U/L — ABNORMAL HIGH (ref 15–41)
Albumin: 1.9 g/dL — ABNORMAL LOW (ref 3.5–5.0)
Alkaline Phosphatase: 588 U/L — ABNORMAL HIGH (ref 38–126)
Anion gap: 8 (ref 5–15)
BUN: 29 mg/dL — ABNORMAL HIGH (ref 8–23)
CO2: 21 mmol/L — ABNORMAL LOW (ref 22–32)
Calcium: 6.8 mg/dL — ABNORMAL LOW (ref 8.9–10.3)
Chloride: 107 mmol/L (ref 98–111)
Creatinine, Ser: 1.05 mg/dL — ABNORMAL HIGH (ref 0.44–1.00)
GFR, Estimated: 55 mL/min — ABNORMAL LOW (ref >60)
Glucose, Bld: 124 mg/dL — ABNORMAL HIGH (ref 70–99)
Potassium: 3.3 mmol/L — ABNORMAL LOW (ref 3.5–5.1)
Sodium: 136 mmol/L (ref 135–145)
Total Bilirubin: 5.6 mg/dL — ABNORMAL HIGH (ref 0.0–1.2)
Total Protein: 6 g/dL — ABNORMAL LOW (ref 6.5–8.1)

## 2024-04-16 LAB — GLUCOSE, CAPILLARY
Glucose-Capillary: 104 mg/dL — ABNORMAL HIGH (ref 70–99)
Glucose-Capillary: 82 mg/dL (ref 70–99)
Glucose-Capillary: 102 mg/dL — ABNORMAL HIGH (ref 70–99)
Glucose-Capillary: 81 mg/dL (ref 70–99)
Glucose-Capillary: 103 mg/dL — ABNORMAL HIGH (ref 70–99)

## 2024-04-16 LAB — CBC WITH DIFFERENTIAL/PLATELET
Abs Immature Granulocytes: 0.34 10*3/uL — ABNORMAL HIGH (ref 0.00–0.07)
Basophils Absolute: 0.1 10*3/uL (ref 0.0–0.1)
Basophils Relative: 0 %
Eosinophils Absolute: 0 10*3/uL (ref 0.0–0.5)
Eosinophils Relative: 0 %
HCT: 25.8 % — ABNORMAL LOW (ref 36.0–46.0)
Hemoglobin: 8.5 g/dL — ABNORMAL LOW (ref 12.0–15.0)
Immature Granulocytes: 2 %
Lymphocytes Relative: 8 %
Lymphs Abs: 1.3 10*3/uL (ref 0.7–4.0)
MCH: 31.3 pg (ref 26.0–34.0)
MCHC: 32.9 g/dL (ref 30.0–36.0)
MCV: 94.9 fL (ref 80.0–100.0)
Monocytes Absolute: 0.6 10*3/uL (ref 0.1–1.0)
Monocytes Relative: 4 %
Neutro Abs: 13.4 10*3/uL — ABNORMAL HIGH (ref 1.7–7.7)
Neutrophils Relative %: 86 %
Platelets: 85 10*3/uL — ABNORMAL LOW (ref 150–400)
RBC: 2.72 MIL/uL — ABNORMAL LOW (ref 3.87–5.11)
RDW: 20.5 % — ABNORMAL HIGH (ref 11.5–15.5)
Smear Review: DECREASED
WBC: 15.6 10*3/uL — ABNORMAL HIGH (ref 4.0–10.5)
nRBC: 0 % (ref 0.0–0.2)

## 2024-04-16 LAB — MAGNESIUM: Magnesium: 2 mg/dL (ref 1.7–2.4)

## 2024-04-16 LAB — LIPASE, BLOOD: Lipase: 87 U/L — ABNORMAL HIGH (ref 11–51)

## 2024-04-16 LAB — HEPARIN LEVEL (UNFRACTIONATED)
Heparin Unfractionated: 0.81 [IU]/mL — ABNORMAL HIGH (ref 0.30–0.70)
Heparin Unfractionated: 0.66 [IU]/mL (ref 0.30–0.70)

## 2024-04-16 LAB — APTT: aPTT: >200 s (ref 24–36)

## 2024-04-16 MED ORDER — SENNOSIDES-DOCUSATE SODIUM 8.6-50 MG PO TABS
1.0000 | ORAL_TABLET | Freq: Two times a day (BID) | ORAL | Status: DC
  Administered 2024-04-16 – 2024-04-23 (×6): 1 via ORAL
  Filled 2024-04-16 (×14): qty 1

## 2024-04-16 MED ORDER — HYDROMORPHONE HCL 2 MG PO TABS
2.0000 mg | ORAL_TABLET | ORAL | Status: DC | PRN
  Administered 2024-04-16: 2 mg via ORAL
  Administered 2024-04-17 – 2024-04-18 (×4): 4 mg via ORAL
  Administered 2024-04-18: 2 mg via ORAL
  Administered 2024-04-18: 4 mg via ORAL
  Administered 2024-04-18: 2 mg via ORAL
  Administered 2024-04-18 – 2024-04-23 (×8): 4 mg via ORAL
  Filled 2024-04-16 (×16): qty 2

## 2024-04-16 MED ORDER — GUAIFENESIN ER 600 MG PO TB12
600.0000 mg | ORAL_TABLET | Freq: Two times a day (BID) | ORAL | Status: DC
  Administered 2024-04-16 – 2024-04-24 (×17): 600 mg via ORAL
  Filled 2024-04-16 (×17): qty 1

## 2024-04-16 MED ORDER — HYDROMORPHONE HCL 1 MG/ML IJ SOLN
0.5000 mg | INTRAMUSCULAR | Status: DC | PRN
  Administered 2024-04-16 – 2024-04-18 (×4): 0.5 mg via INTRAVENOUS
  Filled 2024-04-16 (×4): qty 0.5

## 2024-04-16 MED ORDER — MORPHINE SULFATE (PF) 2 MG/ML IV SOLN
2.0000 mg | INTRAVENOUS | Status: DC | PRN
  Administered 2024-04-16 (×2): 2 mg via INTRAVENOUS
  Filled 2024-04-16 (×2): qty 1

## 2024-04-16 MED ORDER — BISACODYL 10 MG RE SUPP: 10.0000 mg | Freq: Every day | RECTAL | Status: DC | PRN

## 2024-04-16 MED ORDER — POLYETHYLENE GLYCOL 3350 17 G PO PACK
17.0000 g | PACK | Freq: Every day | ORAL | Status: DC
  Administered 2024-04-16 – 2024-04-17 (×2): 17 g via ORAL
  Filled 2024-04-16 (×7): qty 1

## 2024-04-16 NOTE — Progress Notes (Signed)
 Luke Salaam , MD [address], [place], [place], [address] [address], [place], [place], [address] Phone: [phone]  Fax: [phone]   Stacey [name] is being followed for ascending cholangittis s/p ERCP  Day 1 of follow up   Subjective: She is feeling better today denies any fevers chills or rigors much of an appetite   Objective: Vital signs in last 24 hours: Vitals:   04/16/24 0406 04/16/24 0447 04/16/24 0843 04/16/24 1208  BP: 125/65  126/72 114/63  Pulse: (!) 104  (!) 109 (!) 104  Resp: 17  18 18   Temp: 97.8 F (36.6 C)  98.4 F (36.9 C) 98.1 F (36.7 C)  TempSrc: Oral  Oral   SpO2: 100%  90% 100%  Weight:  86.1 kg    Height:       Weight change: -1.444 kg  Intake/Output Summary (Last 24 hours) at 04/16/2024 1240 Last data filed at 04/16/2024 5784 Gross per 24 hour  Intake 267.73 ml  Output 1501 ml  Net -1233.27 ml     Exam:  Abdomen: soft, nontender, normal bowel sounds   Lab Results: @LABTEST2 @ Micro Results: Recent Results (from the past 240 hours)  Culture, blood (Routine X 2) w Reflex to ID Panel     Status: None (Preliminary result)   Collection Time: 04/14/24  7:12 PM   Specimen: BLOOD  Result Value Ref Range Status   Specimen Description BLOOD BLOOD LEFT HAND  Final   Special Requests   Final    BOTTLES DRAWN AEROBIC AND ANAEROBIC Blood Culture adequate volume   Culture   Final    NO GROWTH 2 DAYS Performed at [facility], [address] [place], [place] [address]    Report Status PENDING  Incomplete  Culture, blood (Routine X 2) w Reflex to ID Panel     Status: None (Preliminary result)   Collection Time: 04/14/24  7:13 PM   Specimen: BLOOD  Result Value Ref Range Status   Specimen Description BLOOD BLOOD LEFT ARM  Final   Special Requests   Final    BOTTLES DRAWN AEROBIC AND ANAEROBIC Blood Culture adequate volume   Culture   Final    NO GROWTH 2 DAYS Performed at [facility],  [address] [place], [place] [address]    Report Status PENDING  Incomplete  MRSA Next Gen by PCR, Nasal     Status: Abnormal   Collection Time: 04/15/24  5:40 AM   Specimen: Nasal Mucosa; Nasal Swab  Result Value Ref Range Status   MRSA by PCR Next Gen DETECTED (A) NOT DETECTED Final    Comment: RESULT CALLED TO, READ BACK BY AND VERIFIED WITH: Diallo, Boubacar P, RN 04/15/24 0719 MW (NOTE) The GeneXpert MRSA Assay (FDA approved for NASAL specimens only), is one component of a comprehensive MRSA colonization surveillance program. It is not intended to diagnose MRSA infection nor to guide or monitor treatment for MRSA infections. Test performance is not FDA approved in patients less than 74 years old. Performed at [facility], [address] [place], [place] [address]    Studies/Results: DG C-Arm 1-60 Min-No Report Result Date: 04/15/2024 Fluoroscopy was utilized by the requesting physician.  No radiographic interpretation.   Medications: I have reviewed the patient's current medications. Scheduled Meds:  amLODipine   5 mg Oral Daily   diclofenac   100 mg Rectal Once   DULoxetine   30 mg Oral Daily   ferrous sulfate   325  mg Oral TID WC   guaiFENesin   600 mg Oral BID   losartan   100 mg Oral Daily   metoprolol  succinate  75 mg Oral Daily   pantoprazole   40 mg Oral BID   Continuous Infusions:  heparin  600 Units/hr (04/16/24 1052)   piperacillin -tazobactam (ZOSYN )  IV 12.5 mL/hr at 04/16/24 0558   PRN Meds:.dextromethorphan -guaiFENesin , hydrALAZINE , ibuprofen , ipratropium, methocarbamol , morphine  injection, nitroGLYCERIN , ondansetron  (ZOFRAN ) IV, oxyCODONE , polyvinyl alcohol    Assessment: Principal Problem:   Ascending cholangitis Active Problems:   HTN (hypertension)   CAD (coronary artery disease)   HLD (hyperlipidemia)   COPD (chronic obstructive pulmonary disease) (HCC)   CAD in native artery   Anxiety and depression   Bipolar 2 disorder, major  depressive episode (HCC)   Chronic diastolic CHF (congestive heart failure) (HCC)   Obesity, Class II, BMI 35-39.9   Biliary obstruction   Chronic kidney disease, stage 3b (HCC)   Atrial fibrillation, chronic (HCC)   Thrombocytopenia (HCC)   Stacey [name] 78 y.o. female admitted with cholangitis . S/p ERCP 04/16/2024 , covered metal stent placed and pus swept out. Wcc down from 20 ----> 15 , Cr stable , mild elevation of lipase   Plan: 1.  She is doing well no evidence of pancreatitis clinically.  Total bilirubin and white cell count are trending downwards.  Continue antibiotics.  Encourage oral intake as tolerated.   LOS: 2 days   Luke Salaam, MD 04/16/2024, 12:40 PM

## 2024-04-16 NOTE — Consult Note (Signed)
 PHARMACY - ANTICOAGULATION CONSULT NOTE  Pharmacy Consult for heparin  drip Indication: atrial fibrillation  Allergies  Allergen Reactions   Brilinta  [Ticagrelor ] Diarrhea and Nausea Only    Nausea and severe diarrhea- general weakness. Patient says does not want to take again   Budesonide  Palpitations   Albuterol  Other (See Comments)    Heart racing     Patient Measurements: Height: 5' (152.4 cm) Weight: 86.1 kg (189 lb 13.1 oz) IBW/kg (Calculated) : 45.5 HEPARIN  DW (KG): 66.1  Vital Signs: Temp: 98.4 F (36.9 C) (05/17 0843) Temp Source: Oral (05/17 0843) BP: 126/72 (05/17 0843) Pulse Rate: 109 (05/17 0843)  Labs: Recent Labs    04/14/24 1912 04/14/24 2159 04/15/24 0444 04/15/24 0711 04/15/24 2228 04/16/24 0928  HGB 9.8*  --  10.2*  --   --  8.5*  HCT 30.7*  --  31.8*  --   --  25.8*  PLT 93*  --  90*  --   --  85*  APTT 39*  --   --  130* >200*  --   LABPROT 15.8*  --   --   --   --   --   INR 1.2  --   --   --   --   --   HEPARINUNFRC  --    < >  --  0.84* 0.92* 0.81*  CREATININE 1.06*  --   --  1.00  --  1.05*   < > = values in this interval not displayed.    Estimated Creatinine Clearance: 43.7 mL/min (A) (by C-G formula based on SCr of 1.05 mg/dL (H)).   Medical History: Past Medical History:  Diagnosis Date   CAD in native artery    a. DES to ramus 2005 with late stent thrombosis 2006 tx with PTCA. 12/18 PCI/DES x1 to mRCA, EF 50-55%   CHF (congestive heart failure) (HCC)    Chronic pain    COPD (chronic obstructive pulmonary disease) (HCC)    Hyperlipidemia    Hypertension    Left bundle branch block    Lymphedema    Metastatic breast cancer    a. to bone.   MI (myocardial infarction) (HCC)    Mild aortic stenosis 10/2017   Morbid obesity (HCC)    PAF (paroxysmal atrial fibrillation) (HCC)    PSVT (paroxysmal supraventricular tachycardia) (HCC)    a. per [facility] notes, seen on event monitor in 2014.   Pulmonary nodules     Medications:   Medications Prior to Admission  Medication Sig Dispense Refill Last Dose/Taking   amLODipine  (NORVASC ) 5 MG tablet TAKE 1 TABLET EVERY DAY 90 tablet 3 04/14/2024   atorvastatin  (LIPITOR ) 80 MG tablet Take 80 mg by mouth daily.   04/14/2024   b complex vitamins capsule Take 1 capsule by mouth daily.   04/14/2024   carboxymethylcellulose (REFRESH PLUS) 0.5 % SOLN Place 1 drop into both eyes 3 (three) times daily as needed (Dry Eyes).   Taking As Needed   clopidogrel  (PLAVIX ) 75 MG tablet Take 75 mg by mouth daily.   04/14/2024   diclofenac  Sodium (VOLTAREN ) 1 % GEL Apply topically.   Taking   DULoxetine  (CYMBALTA ) 30 MG capsule Take 30 mg by mouth daily.   04/14/2024   ferrous sulfate  325 (65 FE) MG EC tablet Take 325 mg by mouth 3 (three) times daily with meals.   04/14/2024   HYDROmorphone  (DILAUDID ) 1 MG/ML injection Inject 0.5 mLs (0.5 mg total) into the vein every 2 (two)  hours as needed for severe pain (pain score 7-10) or moderate pain (pain score 4-6).   Taking As Needed   HYDROmorphone  (DILAUDID ) 2 MG tablet Take 4 mg by mouth every 4 (four) hours as needed for severe pain (pain score 7-10).   Taking As Needed   leptospermum manuka honey (MEDIHONEY) PSTE paste Apply 1 Application topically daily. 30 mL 0 04/14/2024   losartan  (COZAAR ) 100 MG tablet TAKE 1 TABLET EVERY DAY 90 tablet 3 04/14/2024   methocarbamol  (ROBAXIN ) 500 MG tablet Take 500 mg by mouth every 6 (six) hours as needed for muscle spasms.   Taking As Needed   metoprolol  succinate (TOPROL -XL) 25 MG 24 hr tablet Take 3 tablets (75 mg total) by mouth daily. 90 tablet 0 04/14/2024   nitroGLYCERIN  (NITROSTAT ) 0.4 MG SL tablet Place 0.4 mg under the tongue every 5 (five) minutes as needed for chest pain.   Taking As Needed   ondansetron  (ZOFRAN ) 4 MG/2ML SOLN injection Inject 2 mLs (4 mg total) into the vein every 6 (six) hours as needed for nausea or vomiting.   Taking As Needed   pantoprazole  (PROTONIX ) 40 MG tablet Take 1 tablet (40 mg  total) by mouth 2 (two) times daily. 60 tablet 0 04/14/2024   apixaban  (ELIQUIS ) 5 MG TABS tablet Take 5 mg by mouth 2 (two) times daily. (Patient not taking: Reported on 04/15/2024)   Not Taking   Scheduled:   amLODipine   5 mg Oral Daily   diclofenac   100 mg Rectal Once   DULoxetine   30 mg Oral Daily   ferrous sulfate   325 mg Oral TID WC   guaiFENesin   600 mg Oral BID   losartan   100 mg Oral Daily   metoprolol  succinate  75 mg Oral Daily   pantoprazole   40 mg Oral BID   Infusions:   heparin  600 Units/hr (04/16/24 1052)   piperacillin -tazobactam (ZOSYN )  IV 12.5 mL/hr at 04/16/24 8657    Assessment: 78 yo female with PMH of hypertension, COPD, CAD, DES placement, diastolic CHF, A-fib on Eliquis , depression with anxiety, CKD-3B, PSVT, chronic pain, left bundle blockade, spinal stenosis, breast cancer metastasized to bone, left renal carcinoma, acute cholangitis and obstructive jaundice (s/p of ERCP). Hgb 8.5, Plt 85, aPTT >200, HL 0.81. PTA last dose of apixaban  was 5/13 (at home per son prior to going to [facility]).  She transferred from [facility] ED to [facility] 5/15. Heparin  held for [facility] performed on 5/16. Pharmacy consulted to resume heparin  dosing.  Goal of Therapy:  Heparin  level 0.3-0.7 units/ml aPTT 66-102  Monitor platelets by anticoagulation protocol: Yes  Date/Time: aPTT/HL: Comment/Rate: 5/16 0711 130/0.84 SUPRAtherapeutic@1000units /hr 5/16 2228 >200/0.92 Supratherapeutic 5/17 0928 >200/0.81 Supratherapeutic   Plan:  - Decrease heparin  infusion to 600 units/hr - Recheck HL level in 8 hours after rate change - Daily CBC, monitor for signs/symptoms of bleeding    Adella Honey, PharmD Candidate 04/16/2024

## 2024-04-16 NOTE — Plan of Care (Signed)

## 2024-04-16 NOTE — Consult Note (Signed)
 PHARMACY - ANTICOAGULATION CONSULT NOTE  Pharmacy Consult for heparin  drip Indication: atrial fibrillation  Allergies  Allergen Reactions   Brilinta  [Ticagrelor ] Diarrhea and Nausea Only    Nausea and severe diarrhea- general weakness. Patient says does not want to take again   Budesonide  Palpitations   Albuterol  Other (See Comments)    Heart racing     Patient Measurements: Height: 5' (152.4 cm) Weight: 86.1 kg (189 lb 13.1 oz) IBW/kg (Calculated) : 45.5 HEPARIN  DW (KG): 66.1  Vital Signs: Temp: 98 F (36.7 C) (05/17 1642) Temp Source: Oral (05/17 1642) BP: 137/70 (05/17 1642) Pulse Rate: 110 (05/17 1642)  Labs: Recent Labs    04/14/24 1912 04/14/24 2159 04/15/24 0444 04/15/24 0711 04/15/24 2228 04/16/24 0928  HGB 9.8*  --  10.2*  --   --  8.5*  HCT 30.7*  --  31.8*  --   --  25.8*  PLT 93*  --  90*  --   --  85*  APTT 39*  --   --  130* >200*  --   LABPROT 15.8*  --   --   --   --   --   INR 1.2  --   --   --   --   --   HEPARINUNFRC  --    < >  --  0.84* 0.92* 0.81*  CREATININE 1.06*  --   --  1.00  --  1.05*   < > = values in this interval not displayed.    Estimated Creatinine Clearance: 43.7 mL/min (A) (by C-G formula based on SCr of 1.05 mg/dL (H)).   Medical History: Past Medical History:  Diagnosis Date   CAD in native artery    a. DES to ramus 2005 with late stent thrombosis 2006 tx with PTCA. 12/18 PCI/DES x1 to mRCA, EF 50-55%   CHF (congestive heart failure) (HCC)    Chronic pain    COPD (chronic obstructive pulmonary disease) (HCC)    Hyperlipidemia    Hypertension    Left bundle branch block    Lymphedema    Metastatic breast cancer    a. to bone.   MI (myocardial infarction) (HCC)    Mild aortic stenosis 10/2017   Morbid obesity (HCC)    PAF (paroxysmal atrial fibrillation) (HCC)    PSVT (paroxysmal supraventricular tachycardia) (HCC)    a. per [facility] notes, seen on event monitor in 2014.   Pulmonary nodules     Medications:   Medications Prior to Admission  Medication Sig Dispense Refill Last Dose/Taking   amLODipine  (NORVASC ) 5 MG tablet TAKE 1 TABLET EVERY DAY 90 tablet 3 04/14/2024   atorvastatin  (LIPITOR ) 80 MG tablet Take 80 mg by mouth daily.   04/14/2024   b complex vitamins capsule Take 1 capsule by mouth daily.   04/14/2024   carboxymethylcellulose (REFRESH PLUS) 0.5 % SOLN Place 1 drop into both eyes 3 (three) times daily as needed (Dry Eyes).   Taking As Needed   clopidogrel  (PLAVIX ) 75 MG tablet Take 75 mg by mouth daily.   04/14/2024   diclofenac  Sodium (VOLTAREN ) 1 % GEL Apply topically.   Taking   DULoxetine  (CYMBALTA ) 30 MG capsule Take 30 mg by mouth daily.   04/14/2024   ferrous sulfate  325 (65 FE) MG EC tablet Take 325 mg by mouth 3 (three) times daily with meals.   04/14/2024   HYDROmorphone  (DILAUDID ) 1 MG/ML injection Inject 0.5 mLs (0.5 mg total) into the vein every 2 (two)  hours as needed for severe pain (pain score 7-10) or moderate pain (pain score 4-6).   Taking As Needed   HYDROmorphone  (DILAUDID ) 2 MG tablet Take 4 mg by mouth every 4 (four) hours as needed for severe pain (pain score 7-10).   Taking As Needed   leptospermum manuka honey (MEDIHONEY) PSTE paste Apply 1 Application topically daily. 30 mL 0 04/14/2024   losartan  (COZAAR ) 100 MG tablet TAKE 1 TABLET EVERY DAY 90 tablet 3 04/14/2024   methocarbamol  (ROBAXIN ) 500 MG tablet Take 500 mg by mouth every 6 (six) hours as needed for muscle spasms.   Taking As Needed   metoprolol  succinate (TOPROL -XL) 25 MG 24 hr tablet Take 3 tablets (75 mg total) by mouth daily. 90 tablet 0 04/14/2024   nitroGLYCERIN  (NITROSTAT ) 0.4 MG SL tablet Place 0.4 mg under the tongue every 5 (five) minutes as needed for chest pain.   Taking As Needed   ondansetron  (ZOFRAN ) 4 MG/2ML SOLN injection Inject 2 mLs (4 mg total) into the vein every 6 (six) hours as needed for nausea or vomiting.   Taking As Needed   pantoprazole  (PROTONIX ) 40 MG tablet Take 1 tablet (40 mg  total) by mouth 2 (two) times daily. 60 tablet 0 04/14/2024   apixaban  (ELIQUIS ) 5 MG TABS tablet Take 5 mg by mouth 2 (two) times daily. (Patient not taking: Reported on 04/15/2024)   Not Taking   Scheduled:   amLODipine   5 mg Oral Daily   diclofenac   100 mg Rectal Once   DULoxetine   30 mg Oral Daily   ferrous sulfate   325 mg Oral TID WC   guaiFENesin   600 mg Oral BID   losartan   100 mg Oral Daily   metoprolol  succinate  75 mg Oral Daily   pantoprazole   40 mg Oral BID   polyethylene glycol  17 g Oral Daily   senna-docusate  1 tablet Oral BID   Infusions:   heparin  600 Units/hr (04/16/24 1052)   piperacillin -tazobactam (ZOSYN )  IV 3.375 g (04/16/24 1253)    Assessment: 78 yo female with PMH of hypertension, COPD, CAD, DES placement, diastolic CHF, A-fib on Eliquis , depression with anxiety, CKD-3B, PSVT, chronic pain, left bundle blockade, spinal stenosis, breast cancer metastasized to bone, left renal carcinoma, acute cholangitis and obstructive jaundice (s/p of ERCP). Hgb 8.5, Plt 85, aPTT >200, HL 0.81. PTA last dose of apixaban  was 5/13 (at home per son prior to going to [facility] ED).  She transferred from [facility] ED to [facility] 5/15. Heparin  held for [facility] performed on 5/16. Pharmacy consulted to resume heparin  dosing.  Goal of Therapy:  Heparin  level 0.3-0.7 units/ml aPTT 66-102  Monitor platelets by anticoagulation protocol: Yes  Date/Time: aPTT/HL: Comment/Rate: 5/16 0711 130/0.84 SUPRAtherapeutic@1000units /hr 5/16 2228 >200/0.92 Supratherapeutic 5/17 0928 >200/0.81 Supratherapeutic 5/17 1910 HL 0.66 Therapeutic x 1   Plan:  - Heparin  level therapeutic x 1 - Continue heparin  infusion at 600 units/hr - Check confirmatory HL level in 8 hours  - Daily CBC, monitor for signs/symptoms of bleeding    Genoveva Kidney, PharmD, BCPS 04/16/2024

## 2024-04-16 NOTE — Progress Notes (Signed)
 PROGRESS NOTE    Stacey  B Stacey [name]  ZOX:[id number] DOB: [birth year] DOA: 04/14/2024 PCP: [name], MD    Brief Narrative:  78 y.o. female with medical history significant of hypertension, COPD, CAD, DES placement, diastolic CHF, A-fib on Eliquis , depression with anxiety, CKD-3B, PSVT, chronic pain, left bundle blockade, spinal stenosis, breast cancer metastasized to bone, left renal carcinoma, acute cholangitis and obstructive jaundice (s/p of ERCP), who presented to [facility] due to nausea, vomiting, right upper quadrant abdominal pain. Pt was accepted for transferring from [facility] per Dr. Isom Marin of [facility] ED on 5/13, arrived today.     Patient was recently hospitalized to [facility] on 4/9 - 4/18 due to acute cholangitis and obstructive jaundice.  Patient is s/p of ERCP, found to have duodenal ulcer, duodenal and sphincter stenosis.  Stent was placed and the purulent discharge was removed.    Patient states that she has nausea, multiple episodes of nonbilious nonbloody vomiting and abdominal pain in the past several days.  No fever or chills.  Abdominal pain is located in the upper abdomen, constant, mild to moderate, aching, nonradiating.  No diarrhea.  No fever or chills.  She states she had some chest discomfort yesterday, which has resolved, currently no active chest pain.  She has mild dry cough, mild SOB due to COPD.  No symptoms of UTI.   CTA of abdomen/pelvis done in [facility] showed severe intra- and extrahepatic biliary ductal dilation.  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  Common bile duct diffuse mucosa hyperenhancement, nonspecific, but can be seen with ascending cholangitis.  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  Osseous metastases not significantly changed.   Liver function with ALP 895, AST 135, ALT 41, total bilirubin 7.1, direct bilirubin 6.09.  Renal function stable, WBC 7.2, INR 1.23.  Temperature normal,  initial blood pressure low with SBP in 80s, which improved to 109/63 after 30 cc/kg normal saline resuscitation.  Heart rate 100, RR 15, oxygen  saturation 99% on room air.  Chest x-ray negative for infiltration. Pt was given Zosyn  in ED.   Dr. Isom Marin of ED in [facility] consulted Dr. Bridgett Camps of GI who reached to Dr. Ole Berkeley of [facility] GI. Dr. Ole Berkeley agreed to see pt after arrival to [facility]. Pt is accepted to PCU as inpt.  5/16: ERCP today  Assessment & Plan:   Principal Problem:   Ascending cholangitis Active Problems:   Biliary obstruction   Atrial fibrillation, chronic (HCC)   CAD (coronary artery disease)   HLD (hyperlipidemia)   Chronic diastolic CHF (congestive heart failure) (HCC)   Chronic kidney disease, stage 3b (HCC)   CAD in native artery   COPD (chronic obstructive pulmonary disease) (HCC)   HTN (hypertension)   Thrombocytopenia (HCC)   Anxiety and depression   Bipolar 2 disorder, major depressive episode (HCC)   Obesity, Class II, BMI 35-39.9  Ascending cholangitis and biliary obstruction:  Patient has WBC 23.6, no fever, clinically does not seem to have sepsis. GI consulted on admission Status post ERCP for send cholangitis.  Bare-metal stent placed.  Bilirubin and LFTs downtrending. Plan: Okay for diet.  Encourage p.o. intake.  Continue IV antibiotics.  Pain control.   Atrial fibrillation, chronic (HCC):  Continue holding oral anticoagulation for now.  Continue IV heparin  GTT.  Continue metoprolol .  If patient remains stable anticipate restarting Eliquis  5/18.   CAD (coronary artery disease): Patent with no chest pain Hold Plavix  and Lipitor  for  today.  Restart as appropriate.   HLD (hyperlipidemia) Holding Lipitor  in the setting of LFT elevation   Chronic diastolic CHF (congestive heart failure) (HCC): 2D echo 03/25/21 showed EF of 50-55% with grade 1 diastolic dysfunction.  BNP is elevated 406, but patient does not have leg edema or JVD.  Does not seem to have CHF  exacerbation. No indication for diuretics.  Continue to monitor volume status   Chronic kidney disease, stage 3b [facility]): Renal function stable.  Recent baseline creatinine 1.45 on 03/23/2024.  Her creatinine is 1.06, BUN 32. Creatinine appears at baseline.  Continue to monitor.  Avoid nephrotoxins.   COPD (chronic obstructive pulmonary disease) (HCC): Stable Continue as needed breathing treatments and Mucinex    HTN (hypertension) Continue home amlodipine , Cozaar , metoprolol .  Continue IV hydralazine  as needed   Thrombocytopenia (HCC): Platelet 93.   Appears mild and chronic.  No bleeding noted.   Anxiety and depression and bipolar 2 disorder, major depressive episode (HCC) Continue home Cymbalta   Obesity, Class II, BMI 35-39.9: Body weight 87.5 kg, BMI 37.69 Encouraged weight loss and diet.  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  Resume outpatient monitoring on discharge  DVT prophylaxis: Hep gtt. Code Status: DNR Family Communication: None today Disposition Plan: Status is: Inpatient Remains inpatient appropriate because: Multiple acute issues as above   Level of care: Progressive  Consultants:  GI  Procedures:  ERCP  Antimicrobials: Zosyn    Subjective: Seen and examined.  Endorses fatigue and poor appetite.  Objective: Vitals:   04/16/24 0406 04/16/24 0447 04/16/24 0843 04/16/24 1208  BP: 125/65  126/72 114/63  Pulse: (!) 104  (!) 109 (!) 104  Resp: 17  18 18   Temp: 97.8 F (36.6 C)  98.4 F (36.9 C) 98.1 F (36.7 C)  TempSrc: Oral  Oral   SpO2: 100%  90% 100%  Weight:  86.1 kg    Height:        Intake/Output Summary (Last 24 hours) at 04/16/2024 1305 Last data filed at 04/16/2024 0558 Gross per 24 hour  Intake 267.73 ml  Output 1501 ml  Net -1233.27 ml   Filed Weights   04/14/24 1759 04/15/24 0500 04/16/24 0447  Weight: 87.5 kg 88.7 kg 86.1 kg     Examination:  General exam: No acute distress Respiratory system: Lungs clear.  Normal work of breathing.  Room air Cardiovascular system: S1-S2, RRR, no murmurs, no pedal edema Gastrointestinal system: Obese, nondistended, RUQ TTP, normal bowel sounds Central nervous system: Alert and oriented. No focal neurological deficits. Extremities: Symmetric 5 x 5 power. Skin: No rashes, lesions or ulcers Psychiatry: Judgement and insight appear normal. Mood & affect appropriate.     Data Reviewed: I have personally reviewed following labs and imaging studies  CBC: Recent Labs  Lab 04/14/24 1912 04/15/24 0444 04/16/24 0928  WBC 23.6* 20.5* 15.6*  NEUTROABS 22.2*  --  13.4*  HGB 9.8* 10.2* 8.5*  HCT 30.7* 31.8* 25.8*  MCV 96.5 98.5 94.9  PLT 93* 90* 85*   Basic Metabolic Panel: Recent Labs  Lab 04/14/24 1912 04/15/24 0711 04/16/24 0626 04/16/24 0928  NA 133* 139  --  136  K 3.4* 3.9  --  3.3*  CL 103 108  --  107  CO2 19* 18*  --  21*  GLUCOSE 122* 87  --  124*  BUN 32* 30*  --  29*  CREATININE 1.06* 1.00  --  1.05*  CALCIUM  7.1* 7.2*  --  6.8*  MG  --   --  2.0  --    GFR: Estimated Creatinine Clearance: 43.7 mL/min (A) (by C-G formula based on SCr of 1.05 mg/dL (H)). Liver Function Tests: Recent Labs  Lab 04/14/24 1912 04/15/24 0711 04/16/24 0928  AST 189* 190* 163*  ALT 63* 64* 59*  ALKPHOS 608* 611* 588*  BILITOT 8.5* 8.4* 5.6*  PROT 6.4* 6.0* 6.0*  ALBUMIN  1.9* 1.9* 1.9*   Recent Labs  Lab 04/16/24 0928  LIPASE 87*   No results for input(s): "AMMONIA" in the last 168 hours. Coagulation Profile: Recent Labs  Lab 04/14/24 1912  INR 1.2   Cardiac Enzymes: No results for input(s): "CKTOTAL", "CKMB", "CKMBINDEX", "TROPONINI" in the last 168 hours. BNP (last 3 results) No results for input(s): "PROBNP" in the last 8760 hours. HbA1C: No results for input(s): "HGBA1C" in the last 72 hours. CBG: Recent Labs  Lab 04/15/24 1602 04/15/24 1607  04/16/24 0403 04/16/24 0846 04/16/24 1208  GLUCAP 84 83 104* 82 102*   Lipid Profile: No results for input(s): "CHOL", "HDL", "LDLCALC", "TRIG", "CHOLHDL", "LDLDIRECT" in the last 72 hours. Thyroid  Function Tests: No results for input(s): "TSH", "T4TOTAL", "FREET4", "T3FREE", "THYROIDAB" in the last 72 hours. Anemia Panel: No results for input(s): "VITAMINB12", "FOLATE", "FERRITIN", "TIBC", "IRON", "RETICCTPCT" in the last 72 hours. Sepsis Labs: No results for input(s): "PROCALCITON", "LATICACIDVEN" in the last 168 hours.  Recent Results (from the past 240 hours)  Culture, blood (Routine X 2) w Reflex to ID Panel     Status: None (Preliminary result)   Collection Time: 04/14/24  7:12 PM   Specimen: BLOOD  Result Value Ref Range Status   Specimen Description BLOOD BLOOD LEFT HAND  Final   Special Requests   Final    BOTTLES DRAWN AEROBIC AND ANAEROBIC Blood Culture adequate volume   Culture   Final    NO GROWTH 2 DAYS Performed at [facility], [address] [place], [place] [address]    Report Status PENDING  Incomplete  Culture, blood (Routine X 2) w Reflex to ID Panel     Status: None (Preliminary result)   Collection Time: 04/14/24  7:13 PM   Specimen: BLOOD  Result Value Ref Range Status   Specimen Description BLOOD BLOOD LEFT ARM  Final   Special Requests   Final    BOTTLES DRAWN AEROBIC AND ANAEROBIC Blood Culture adequate volume   Culture   Final    NO GROWTH 2 DAYS Performed at [facility], [address] [place], [place] [address]    Report Status PENDING  Incomplete  MRSA Next Gen by PCR, Nasal     Status: Abnormal   Collection Time: 04/15/24  5:40 AM   Specimen: Nasal Mucosa; Nasal Swab  Result Value Ref Range Status   MRSA by PCR Next Gen DETECTED (A) NOT DETECTED Final    Comment: RESULT CALLED TO, READ BACK BY AND VERIFIED WITH: Diallo, Boubacar P, RN 04/15/24 0719 MW (NOTE) The GeneXpert MRSA Assay (FDA approved for NASAL specimens  only), is one component of a comprehensive MRSA colonization surveillance program. It is not intended to diagnose MRSA infection nor to guide or monitor treatment for MRSA infections. Test performance is not FDA approved in patients less than 52 years old. Performed at [facility], [address] [place], [place] [address]          Radiology Studies: DG C-Arm 1-60 Min-No Report Result Date:  04/15/2024 Fluoroscopy was utilized by the requesting physician.  No radiographic interpretation.        Scheduled Meds:  amLODipine   5 mg Oral Daily   diclofenac   100 mg Rectal Once   DULoxetine   30 mg Oral Daily   ferrous sulfate   325 mg Oral TID WC   guaiFENesin   600 mg Oral BID   losartan   100 mg Oral Daily   metoprolol  succinate  75 mg Oral Daily   pantoprazole   40 mg Oral BID   Continuous Infusions:  heparin  600 Units/hr (04/16/24 1052)   piperacillin -tazobactam (ZOSYN )  IV 3.375 g (04/16/24 1253)     LOS: 2 days     Tiajuana Fluke, MD [facility]   If 7PM-7AM, please contact night-coverage  04/16/2024, 1:05 PM

## 2024-04-17 DIAGNOSIS — K8309 Other cholangitis: Secondary | ICD-10-CM | POA: Diagnosis not present

## 2024-04-17 DIAGNOSIS — R748 Abnormal levels of other serum enzymes: Secondary | ICD-10-CM | POA: Diagnosis not present

## 2024-04-17 LAB — COMPREHENSIVE METABOLIC PANEL WITH GFR
ALT: 51 U/L — ABNORMAL HIGH (ref 0–44)
AST: 132 U/L — ABNORMAL HIGH (ref 15–41)
Albumin: 1.7 g/dL — ABNORMAL LOW (ref 3.5–5.0)
Alkaline Phosphatase: 541 U/L — ABNORMAL HIGH (ref 38–126)
Anion gap: 8 (ref 5–15)
BUN: 24 mg/dL — ABNORMAL HIGH (ref 8–23)
CO2: 20 mmol/L — ABNORMAL LOW (ref 22–32)
Calcium: 6.6 mg/dL — ABNORMAL LOW (ref 8.9–10.3)
Chloride: 112 mmol/L — ABNORMAL HIGH (ref 98–111)
Creatinine, Ser: 0.95 mg/dL (ref 0.44–1.00)
GFR, Estimated: >60 mL/min (ref >60)
Glucose, Bld: 90 mg/dL (ref 70–99)
Potassium: 3 mmol/L — ABNORMAL LOW (ref 3.5–5.1)
Sodium: 140 mmol/L (ref 135–145)
Total Bilirubin: 5 mg/dL — ABNORMAL HIGH (ref 0.0–1.2)
Total Protein: 5.3 g/dL — ABNORMAL LOW (ref 6.5–8.1)

## 2024-04-17 LAB — CBC
HCT: 24.6 % — ABNORMAL LOW (ref 36.0–46.0)
HCT: 23.7 % — ABNORMAL LOW (ref 36.0–46.0)
Hemoglobin: 8 g/dL — ABNORMAL LOW (ref 12.0–15.0)
Hemoglobin: 7.7 g/dL — ABNORMAL LOW (ref 12.0–15.0)
MCH: 30.8 pg (ref 26.0–34.0)
MCH: 31 pg (ref 26.0–34.0)
MCHC: 32.5 g/dL (ref 30.0–36.0)
MCHC: 32.5 g/dL (ref 30.0–36.0)
MCV: 94.6 fL (ref 80.0–100.0)
MCV: 95.6 fL (ref 80.0–100.0)
Platelets: 80 10*3/uL — ABNORMAL LOW (ref 150–400)
Platelets: 77 10*3/uL — ABNORMAL LOW (ref 150–400)
RBC: 2.6 MIL/uL — ABNORMAL LOW (ref 3.87–5.11)
RBC: 2.48 MIL/uL — ABNORMAL LOW (ref 3.87–5.11)
RDW: 20.3 % — ABNORMAL HIGH (ref 11.5–15.5)
RDW: 20.5 % — ABNORMAL HIGH (ref 11.5–15.5)
WBC: 11.5 10*3/uL — ABNORMAL HIGH (ref 4.0–10.5)
WBC: 9.1 10*3/uL (ref 4.0–10.5)
nRBC: 0 % (ref 0.0–0.2)
nRBC: 0 % (ref 0.0–0.2)

## 2024-04-17 LAB — GLUCOSE, CAPILLARY
Glucose-Capillary: 37 mg/dL — CL (ref 70–99)
Glucose-Capillary: 46 mg/dL — ABNORMAL LOW (ref 70–99)
Glucose-Capillary: 82 mg/dL (ref 70–99)
Glucose-Capillary: 92 mg/dL (ref 70–99)
Glucose-Capillary: 53 mg/dL — ABNORMAL LOW (ref 70–99)
Glucose-Capillary: 168 mg/dL — ABNORMAL HIGH (ref 70–99)
Glucose-Capillary: 82 mg/dL (ref 70–99)

## 2024-04-17 LAB — HEPARIN LEVEL (UNFRACTIONATED): Heparin Unfractionated: 0.61 [IU]/mL (ref 0.30–0.70)

## 2024-04-17 MED ORDER — DEXTROSE 50 % IV SOLN
1.0000 | INTRAVENOUS | Status: DC | PRN
  Administered 2024-04-17 – 2024-04-20 (×5): 50 mL via INTRAVENOUS
  Filled 2024-04-17 (×6): qty 50

## 2024-04-17 MED ORDER — KCL IN DEXTROSE-NACL 20-5-0.45 MEQ/L-%-% IV SOLN
INTRAVENOUS | Status: AC
  Filled 2024-04-17 (×3): qty 1000

## 2024-04-17 MED ORDER — FENTANYL CITRATE PF 50 MCG/ML IJ SOSY
25.0000 ug | PREFILLED_SYRINGE | INTRAMUSCULAR | Status: DC | PRN
  Administered 2024-04-17 – 2024-04-18 (×2): 25 ug via INTRAVENOUS
  Filled 2024-04-17 (×2): qty 1

## 2024-04-17 MED ORDER — DEXTROSE 50 % IV SOLN
INTRAVENOUS | Status: AC
  Administered 2024-04-17: 50 mL
  Filled 2024-04-17: qty 50

## 2024-04-17 NOTE — Progress Notes (Addendum)
 PROGRESS NOTE    Stacey [name]  GNF:[id number] DOB: 1946-09-22 DOA: 04/14/2024 PCP: [name], MD    Brief Narrative:  78 y.o. female with medical history significant of hypertension, COPD, CAD, DES placement, diastolic CHF, A-fib on Eliquis , depression with anxiety, CKD-3B, PSVT, chronic pain, left bundle blockade, spinal stenosis, breast cancer metastasized to bone, left renal carcinoma, acute cholangitis and obstructive jaundice (s/p of ERCP), who presented to [facility] due to nausea, vomiting, right upper quadrant abdominal pain. Pt was accepted for transferring from [facility] per Dr. Isom Marin of [facility] ED on 5/13, arrived today.     Patient was recently hospitalized to [facility] on 4/9 - 4/18 due to acute cholangitis and obstructive jaundice.  Patient is s/p of ERCP, found to have duodenal ulcer, duodenal and sphincter stenosis.  Stent was placed and the purulent discharge was removed.    Patient states that she has nausea, multiple episodes of nonbilious nonbloody vomiting and abdominal pain in the past several days.  No fever or chills.  Abdominal pain is located in the upper abdomen, constant, mild to moderate, aching, nonradiating.  No diarrhea.  No fever or chills.  She states she had some chest discomfort yesterday, which has resolved, currently no active chest pain.  She has mild dry cough, mild SOB due to COPD.  No symptoms of UTI.   CTA of abdomen/pelvis done in [facility] showed severe intra- and extrahepatic biliary ductal dilation.  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  Common bile duct diffuse mucosa hyperenhancement, nonspecific, but can be seen with ascending cholangitis.  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  Osseous metastases not significantly changed.   Liver function with ALP 895, AST 135, ALT 41, total bilirubin 7.1, direct bilirubin 6.09.  Renal function stable, WBC 7.2, INR 1.23.  Temperature normal,  initial blood pressure low with SBP in 80s, which improved to 109/63 after 30 cc/kg normal saline resuscitation.  Heart rate 100, RR 15, oxygen  saturation 99% on room air.  Chest x-ray negative for infiltration. Pt was given Zosyn  in ED.   Dr. Isom Marin of ED in [facility] consulted Dr. Bridgett Camps of GI who reached to Dr. Ole Berkeley of [facility] GI. Dr. Ole Berkeley agreed to see pt after arrival to [facility]. Pt is accepted to PCU as inpt.  5/16: ERCP today  Assessment & Plan:   Principal Problem:   Ascending cholangitis Active Problems:   Biliary obstruction   Atrial fibrillation, chronic (HCC)   CAD (coronary artery disease)   HLD (hyperlipidemia)   Chronic diastolic CHF (congestive heart failure) (HCC)   Chronic kidney disease, stage 3b (HCC)   CAD in native artery   COPD (chronic obstructive pulmonary disease) (HCC)   HTN (hypertension)   Thrombocytopenia (HCC)   Anxiety and depression   Bipolar 2 disorder, major depressive episode (HCC)   Obesity, Class II, BMI 35-39.9  Ascending cholangitis and biliary obstruction:  Patient has WBC 23.6, no fever, clinically does not seem to have sepsis. GI consulted on admission Status post ERCP for ascending cholangitis.  Fully covered metal stent placed.  Bilirubin and LFTs downtrending.  Per GI this type of stent can be removed in the future. Plan: Labs pending this a.m.  Continue to encourage p.o. intake.  Continue D5 half-normal saline for now.  Continue IV antibiotics.  Continue pain control.  Atrial fibrillation, chronic (HCC):  Eliquis  resumed 5/19   CAD (coronary artery disease): Patent with no chest pain  Plavix  resumed.  Lipitor  remains on hold   HLD (hyperlipidemia) Lipitor  hold   Chronic diastolic CHF (congestive heart failure) (HCC): 2D echo 03/25/21 showed EF of 50-55% with grade 1 diastolic dysfunction.  BNP is elevated 406, but patient does not have leg edema or JVD.  Does not seem to have CHF exacerbation. No indication for diuretics.  Continue to  monitor volume status   Chronic kidney disease, stage 3b [facility]): Renal function stable.  Recent baseline creatinine 1.45 on 03/23/2024.  Her creatinine is 1.06, BUN 32. Creatinine appears at baseline.  Continue monitor.  Avoid nephrotoxins   COPD (chronic obstructive pulmonary disease) (HCC): Stable Continue as needed bronchodilators   HTN (hypertension) Continue home Norvasc , Cozaar , Lopressor    Thrombocytopenia (HCC): Platelet 93.   Appears mild and chronic.  No bleeding noted. Monitor carefully while on anticoagulation and antiplatelet   Anxiety and depression and bipolar 2 disorder, major depressive episode (HCC) Continue home Cymbalta   Obesity, Class II, BMI 35-39.9: Body weight 87.5 kg, BMI 37.69 Encouraged weight loss and diet.  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  Resume outpatient monitoring on discharge  DVT prophylaxis: Hep gtt. Code Status: DNR Family Communication: None today Disposition Plan: Status is: Inpatient Remains inpatient appropriate because: Multiple acute issues as above   Level of care: Progressive  Consultants:  GI  Procedures:  ERCP  Antimicrobials: Zosyn    Subjective: Seen and examined.  Endorses poor appetite and diffuse pain.  Objective: Vitals:   04/16/24 2013 04/17/24 0128 04/17/24 0502 04/17/24 0810  BP: (!) 125/54 (!) 135/58 136/61 (!) 144/62  Pulse: 80 72 82 80  Resp: 18 18 18    Temp: 97.8 F (36.6 C) 98 F (36.7 C)  97.7 F (36.5 C)  TempSrc: Oral Oral    SpO2:  97%  100%  Weight:      Height:        Intake/Output Summary (Last 24 hours) at 04/17/2024 1152 Last data filed at 04/16/2024 2000 Gross per 24 hour  Intake --  Output 800 ml  Net -800 ml   Filed Weights   04/14/24 1759 04/15/24 0500 04/16/24 0447  Weight: 87.5 kg 88.7 kg 86.1 kg    Examination:  General exam: Frail chronically ill Respiratory system: Lung clear.   Normal work of breathing.  Room air Cardiovascular system: S1-S2, RRR, no murmurs, no pedal edema Gastrointestinal system: Obese, nondistended, RUQ TTP, normal bowel sounds Central nervous system: Alert and oriented. No focal neurological deficits. Extremities: Symmetric 5 x 5 power. Skin: No rashes, lesions or ulcers Psychiatry: Judgement and insight appear normal. Mood & affect appropriate.     Data Reviewed: I have personally reviewed following labs and imaging studies  CBC: Recent Labs  Lab 04/14/24 1912 04/15/24 0444 04/16/24 0928 04/17/24 0302  WBC 23.6* 20.5* 15.6* 11.5*  NEUTROABS 22.2*  --  13.4*  --   HGB 9.8* 10.2* 8.5* 8.0*  HCT 30.7* 31.8* 25.8* 24.6*  MCV 96.5 98.5 94.9 94.6  PLT 93* 90* 85* 80*   Basic Metabolic Panel: Recent Labs  Lab 04/14/24 1912 04/15/24 0711 04/16/24 0626 04/16/24 0928  NA 133* 139  --  136  K 3.4* 3.9  --  3.3*  CL 103 108  --  107  CO2 19* 18*  --  21*  GLUCOSE 122* 87  --  124*  BUN 32* 30*  --  29*  CREATININE 1.06* 1.00  --  1.05*  CALCIUM  7.1* 7.2*  --  6.8*  MG  --   --  2.0  --    GFR: Estimated Creatinine Clearance: 43.7 mL/min (A) (by C-G formula based on SCr of 1.05 mg/dL (H)). Liver Function Tests: Recent Labs  Lab 04/14/24 1912 04/15/24 0711 04/16/24 0928  AST 189* 190* 163*  ALT 63* 64* 59*  ALKPHOS 608* 611* 588*  BILITOT 8.5* 8.4* 5.6*  PROT 6.4* 6.0* 6.0*  ALBUMIN  1.9* 1.9* 1.9*   Recent Labs  Lab 04/16/24 0928  LIPASE 87*   No results for input(s): "AMMONIA" in the last 168 hours. Coagulation Profile: Recent Labs  Lab 04/14/24 1912  INR 1.2   Cardiac Enzymes: No results for input(s): "CKTOTAL", "CKMB", "CKMBINDEX", "TROPONINI" in the last 168 hours. BNP (last 3 results) No results for input(s): "PROBNP" in the last 8760 hours. HbA1C: No results for input(s): "HGBA1C" in the last 72 hours. CBG: Recent Labs  Lab 04/16/24 1644 04/16/24 2156 04/17/24 0759 04/17/24 0859 04/17/24 0950   GLUCAP 81 103* 37* 46* 82   Lipid Profile: No results for input(s): "CHOL", "HDL", "LDLCALC", "TRIG", "CHOLHDL", "LDLDIRECT" in the last 72 hours. Thyroid  Function Tests: No results for input(s): "TSH", "T4TOTAL", "FREET4", "T3FREE", "THYROIDAB" in the last 72 hours. Anemia Panel: No results for input(s): "VITAMINB12", "FOLATE", "FERRITIN", "TIBC", "IRON", "RETICCTPCT" in the last 72 hours. Sepsis Labs: No results for input(s): "PROCALCITON", "LATICACIDVEN" in the last 168 hours.  Recent Results (from the past 240 hours)  Culture, blood (Routine X 2) w Reflex to ID Panel     Status: None (Preliminary result)   Collection Time: 04/14/24  7:12 PM   Specimen: BLOOD  Result Value Ref Range Status   Specimen Description BLOOD BLOOD LEFT HAND  Final   Special Requests   Final    BOTTLES DRAWN AEROBIC AND ANAEROBIC Blood Culture adequate volume   Culture   Final    NO GROWTH 3 DAYS Performed at [facility], [address], [place], [place] [address]    Report Status PENDING  Incomplete  Culture, blood (Routine X 2) w Reflex to ID Panel     Status: None (Preliminary result)   Collection Time: 04/14/24  7:13 PM   Specimen: BLOOD  Result Value Ref Range Status   Specimen Description BLOOD BLOOD LEFT ARM  Final   Special Requests   Final    BOTTLES DRAWN AEROBIC AND ANAEROBIC Blood Culture adequate volume   Culture   Final    NO GROWTH 3 DAYS Performed at [facility], [address], [place], [place] [address]    Report Status PENDING  Incomplete  MRSA Next Gen by PCR, Nasal     Status: Abnormal   Collection Time: 04/15/24  5:40 AM   Specimen: Nasal Mucosa; Nasal Swab  Result Value Ref Range Status   MRSA by PCR Next Gen DETECTED (A) NOT DETECTED Final    Comment: RESULT CALLED TO, READ BACK BY AND VERIFIED WITH: Diallo, Boubacar P, RN 04/15/24 0719 MW (NOTE) The GeneXpert MRSA Assay (FDA approved for NASAL specimens only), is one component of a comprehensive  MRSA colonization surveillance program. It is not intended to diagnose MRSA infection nor to guide or monitor treatment for MRSA infections. Test performance is not FDA approved in patients less than 29 years old. Performed at [facility], [address], [place], [place] [address]          Radiology Studies: DG C-Arm 1-60 Min-No Report  Result Date: 04/15/2024 Fluoroscopy was utilized by the requesting physician.  No radiographic interpretation.        Scheduled Meds:  amLODipine   5 mg Oral Daily   DULoxetine   30 mg Oral Daily   ferrous sulfate   325 mg Oral TID WC   guaiFENesin   600 mg Oral BID   losartan   100 mg Oral Daily   metoprolol  succinate  75 mg Oral Daily   pantoprazole   40 mg Oral BID   polyethylene glycol  17 g Oral Daily   senna-docusate  1 tablet Oral BID   Continuous Infusions:  dextrose  5 % and 0.45 % NaCl with KCl 20 mEq/L     heparin  600 Units/hr (04/16/24 1052)   piperacillin -tazobactam (ZOSYN )  IV 3.375 g (04/17/24 0504)     LOS: 3 days     Tiajuana Fluke, MD [facility]   If 7PM-7AM, please contact night-coverage  04/17/2024, 11:52 AM

## 2024-04-17 NOTE — Plan of Care (Signed)
  Problem: Education: Goal: Knowledge of General Education information will improve Description: Including pain rating scale, medication(s)/side effects and non-pharmacologic comfort measures 04/17/2024 0707 by Berdella Bacot K, RN Outcome: Progressing 04/17/2024 0705 by Prentice Brochure, RN Outcome: Progressing   Problem: Clinical Measurements: Goal: Ability to maintain clinical measurements within normal limits will improve 04/17/2024 0707 by Prentice Brochure, RN Outcome: Progressing 04/17/2024 0705 by Laquasha Groome K, RN Outcome: Progressing

## 2024-04-17 NOTE — Plan of Care (Signed)

## 2024-04-17 NOTE — Progress Notes (Signed)
 Luke Salaam , MD [address], [place], [place], [address] [address], [address], [place], [place], [address] Phone: [phone]  Fax: [phone]   Kinzlie  B Nazario is being followed for cholangitis  Day 2 of follow up   Subjective: Doing well no complaints   Objective: Vital signs in last 24 hours: Vitals:   04/16/24 2013 04/17/24 0128 04/17/24 0502 04/17/24 0810  BP: (!) 125/54 (!) 135/58 136/61 (!) 144/62  Pulse: 80 72 82 80  Resp: 18 18 18    Temp: 97.8 F (36.6 C) 98 F (36.7 C)  97.7 F (36.5 C)  TempSrc: Oral Oral    SpO2:  97%  100%  Weight:      Height:       Weight change:   Intake/Output Summary (Last 24 hours) at 04/17/2024 1001 Last data filed at 04/16/2024 2000 Gross per 24 hour  Intake --  Output 800 ml  Net -800 ml     Exam:  Abdomen: soft, nontender, normal bowel sounds   Lab Results: @LABTEST2 @ Micro Results: Recent Results (from the past 240 hours)  Culture, blood (Routine X 2) w Reflex to ID Panel     Status: None (Preliminary result)   Collection Time: 04/14/24  7:12 PM   Specimen: BLOOD  Result Value Ref Range Status   Specimen Description BLOOD BLOOD LEFT HAND  Final   Special Requests   Final    BOTTLES DRAWN AEROBIC AND ANAEROBIC Blood Culture adequate volume   Culture   Final    NO GROWTH 3 DAYS Performed at [facility], [address], [place], [place] [address]    Report Status PENDING  Incomplete  Culture, blood (Routine X 2) w Reflex to ID Panel     Status: None (Preliminary result)   Collection Time: 04/14/24  7:13 PM   Specimen: BLOOD  Result Value Ref Range Status   Specimen Description BLOOD BLOOD LEFT ARM  Final   Special Requests   Final    BOTTLES DRAWN AEROBIC AND ANAEROBIC Blood Culture adequate volume   Culture   Final    NO GROWTH 3 DAYS Performed at [facility], [address] [place], [place] [address]    Report Status PENDING  Incomplete  MRSA Next Gen by PCR,  Nasal     Status: Abnormal   Collection Time: 04/15/24  5:40 AM   Specimen: Nasal Mucosa; Nasal Swab  Result Value Ref Range Status   MRSA by PCR Next Gen DETECTED (A) NOT DETECTED Final    Comment: RESULT CALLED TO, READ BACK BY AND VERIFIED WITH: Diallo, Boubacar P, RN 04/15/24 0719 MW (NOTE) The GeneXpert MRSA Assay (FDA approved for NASAL specimens only), is one component of a comprehensive MRSA colonization surveillance program. It is not intended to diagnose MRSA infection nor to guide or monitor treatment for MRSA infections. Test performance is not FDA approved in patients less than 77 years old. Performed at [facility], [address], [place], [place] [address]    Studies/Results: DG C-Arm 1-60 Min-No Report Result Date: 04/15/2024 Fluoroscopy was utilized by the requesting physician.  No radiographic interpretation.   Medications: I have reviewed the patient's current medications. Scheduled Meds:  amLODipine   5 mg Oral Daily   DULoxetine   30 mg Oral Daily   ferrous sulfate   325 mg Oral TID WC   guaiFENesin   600 mg Oral BID   losartan   100 mg Oral Daily   metoprolol  succinate  75 mg Oral Daily   pantoprazole   40 mg Oral BID   polyethylene glycol  17 g Oral Daily   senna-docusate  1 tablet Oral BID   Continuous Infusions:  heparin  600 Units/hr (04/16/24 1052)   piperacillin -tazobactam (ZOSYN )  IV 3.375 g (04/17/24 0504)   PRN Meds:.bisacodyl , dextromethorphan -guaiFENesin , dextrose , hydrALAZINE , HYDROmorphone , HYDROmorphone , ibuprofen , ipratropium, methocarbamol , nitroGLYCERIN , ondansetron  (ZOFRAN ) IV, polyvinyl alcohol    Assessment: Principal Problem:   Ascending cholangitis Active Problems:   HTN (hypertension)   CAD (coronary artery disease)   HLD (hyperlipidemia)   COPD (chronic obstructive pulmonary disease) (HCC)   CAD in native artery   Anxiety and depression   Bipolar 2 disorder, major depressive episode (HCC)   Chronic diastolic CHF  (congestive heart failure) (HCC)   Obesity, Class II, BMI 35-39.9   Biliary obstruction   Chronic kidney disease, stage 3b (HCC)   Atrial fibrillation, chronic (HCC)   Thrombocytopenia (HCC)     Latest Ref Rng & Units 04/17/2024   12:42 PM 04/16/2024    9:28 AM 04/15/2024    7:11 AM  Hepatic Function  Total Protein 6.5 - 8.1 g/dL 5.3  6.0  6.0   Albumin  3.5 - 5.0 g/dL 1.7  1.9  1.9   AST 15 - 41 U/L 132  163  190   ALT 0 - 44 U/L 51  59  64   Alk Phosphatase 38 - 126 U/L 541  588  611   Total Bilirubin 0.0 - 1.2 mg/dL 5.0  5.6  8.4     Stacey  B [name] 78 y.o. female admitted with cholangitis . S/p ERCP 04/16/2024 , covered metal stent placed and pus swept out. Wcc down from 20 ----> 15--->9.1 , Cr stable , mild elevation of lipase     Plan: 1.  She is doing well no evidence of pancreatitis clinically.  Total bilirubin and white cell count are trending downwards.  Continue antibiotics.  Encourage oral intake as tolerated.   I will sign off.  Please call me if any further GI concerns or questions.  We would like to thank you for the opportunity to participate in the care of Shawny  B Dacy.     LOS: 3 days   Luke Salaam, MD 04/17/2024, 10:01 AM

## 2024-04-17 NOTE — Consult Note (Signed)
 PHARMACY - ANTICOAGULATION CONSULT NOTE  Pharmacy Consult for heparin  drip Indication: atrial fibrillation  Allergies  Allergen Reactions   Brilinta  [Ticagrelor ] Diarrhea and Nausea Only    Nausea and severe diarrhea- general weakness. Patient says does not want to take again   Budesonide  Palpitations   Albuterol  Other (See Comments)    Heart racing     Patient Measurements: Height: 5' (152.4 cm) Weight: 86.1 kg (189 lb 13.1 oz) IBW/kg (Calculated) : 45.5 HEPARIN  DW (KG): 66.1  Vital Signs: Temp: 98 F (36.7 C) (05/18 0128) Temp Source: Oral (05/18 0128) BP: 135/58 (05/18 0128) Pulse Rate: 72 (05/18 0128)  Labs: Recent Labs    04/14/24 1912 04/14/24 2159 04/15/24 0444 04/15/24 0711 04/15/24 2228 04/16/24 0928 04/16/24 1910 04/17/24 0302  HGB 9.8*  --  10.2*  --   --  8.5*  --  8.0*  HCT 30.7*  --  31.8*  --   --  25.8*  --  24.6*  PLT 93*  --  90*  --   --  85*  --  80*  APTT 39*  --   --  130* >200*  --   --   --   LABPROT 15.8*  --   --   --   --   --   --   --   INR 1.2  --   --   --   --   --   --   --   HEPARINUNFRC  --    < >  --  0.84* 0.92* 0.81* 0.66 0.61  CREATININE 1.06*  --   --  1.00  --  1.05*  --   --    < > = values in this interval not displayed.    Estimated Creatinine Clearance: 43.7 mL/min (A) (by C-G formula based on SCr of 1.05 mg/dL (H)).   Medical History: Past Medical History:  Diagnosis Date   CAD in native artery    a. DES to ramus 2005 with late stent thrombosis 2006 tx with PTCA. 12/18 PCI/DES x1 to mRCA, EF 50-55%   CHF (congestive heart failure) (HCC)    Chronic pain    COPD (chronic obstructive pulmonary disease) (HCC)    Hyperlipidemia    Hypertension    Left bundle branch block    Lymphedema    Metastatic breast cancer    a. to bone.   MI (myocardial infarction) (HCC)    Mild aortic stenosis 10/2017   Morbid obesity (HCC)    PAF (paroxysmal atrial fibrillation) (HCC)    PSVT (paroxysmal supraventricular  tachycardia) (HCC)    a. per [facility] notes, seen on event monitor in 2014.   Pulmonary nodules     Medications:  Medications Prior to Admission  Medication Sig Dispense Refill Last Dose/Taking   amLODipine  (NORVASC ) 5 MG tablet TAKE 1 TABLET EVERY DAY 90 tablet 3 04/14/2024   atorvastatin  (LIPITOR ) 80 MG tablet Take 80 mg by mouth daily.   04/14/2024   b complex vitamins capsule Take 1 capsule by mouth daily.   04/14/2024   carboxymethylcellulose (REFRESH PLUS) 0.5 % SOLN Place 1 drop into both eyes 3 (three) times daily as needed (Dry Eyes).   Taking As Needed   clopidogrel  (PLAVIX ) 75 MG tablet Take 75 mg by mouth daily.   04/14/2024   diclofenac  Sodium (VOLTAREN ) 1 % GEL Apply topically.   Taking   DULoxetine  (CYMBALTA ) 30 MG capsule Take 30 mg by mouth daily.   04/14/2024  ferrous sulfate  325 (65 FE) MG EC tablet Take 325 mg by mouth 3 (three) times daily with meals.   04/14/2024   HYDROmorphone  (DILAUDID ) 1 MG/ML injection Inject 0.5 mLs (0.5 mg total) into the vein every 2 (two) hours as needed for severe pain (pain score 7-10) or moderate pain (pain score 4-6).   Taking As Needed   HYDROmorphone  (DILAUDID ) 2 MG tablet Take 4 mg by mouth every 4 (four) hours as needed for severe pain (pain score 7-10).   Taking As Needed   leptospermum manuka honey (MEDIHONEY) PSTE paste Apply 1 Application topically daily. 30 mL 0 04/14/2024   losartan  (COZAAR ) 100 MG tablet TAKE 1 TABLET EVERY DAY 90 tablet 3 04/14/2024   methocarbamol  (ROBAXIN ) 500 MG tablet Take 500 mg by mouth every 6 (six) hours as needed for muscle spasms.   Taking As Needed   metoprolol  succinate (TOPROL -XL) 25 MG 24 hr tablet Take 3 tablets (75 mg total) by mouth daily. 90 tablet 0 04/14/2024   nitroGLYCERIN  (NITROSTAT ) 0.4 MG SL tablet Place 0.4 mg under the tongue every 5 (five) minutes as needed for chest pain.   Taking As Needed   ondansetron  (ZOFRAN ) 4 MG/2ML SOLN injection Inject 2 mLs (4 mg total) into the vein every 6 (six) hours  as needed for nausea or vomiting.   Taking As Needed   pantoprazole  (PROTONIX ) 40 MG tablet Take 1 tablet (40 mg total) by mouth 2 (two) times daily. 60 tablet 0 04/14/2024   apixaban  (ELIQUIS ) 5 MG TABS tablet Take 5 mg by mouth 2 (two) times daily. (Patient not taking: Reported on 04/15/2024)   Not Taking   Scheduled:   amLODipine   5 mg Oral Daily   diclofenac   100 mg Rectal Once   DULoxetine   30 mg Oral Daily   ferrous sulfate   325 mg Oral TID WC   guaiFENesin   600 mg Oral BID   losartan   100 mg Oral Daily   metoprolol  succinate  75 mg Oral Daily   pantoprazole   40 mg Oral BID   polyethylene glycol  17 g Oral Daily   senna-docusate  1 tablet Oral BID   Infusions:   heparin  600 Units/hr (04/16/24 1052)   piperacillin -tazobactam (ZOSYN )  IV 3.375 g (04/16/24 2050)    Assessment: 78 yo female with PMH of hypertension, COPD, CAD, DES placement, diastolic CHF, A-fib on Eliquis , depression with anxiety, CKD-3B, PSVT, chronic pain, left bundle blockade, spinal stenosis, breast cancer metastasized to bone, left renal carcinoma, acute cholangitis and obstructive jaundice (s/p of ERCP). Hgb 8.5, Plt 85, aPTT >200, HL 0.81. PTA last dose of apixaban  was 5/13 (at home per son prior to going to [facility] ED).  She transferred from [facility] ED to [facility] 5/15. Heparin  held for [facility] performed on 5/16. Pharmacy consulted to resume heparin  dosing.  Goal of Therapy:  Heparin  level 0.3-0.7 units/ml aPTT 66-102  Monitor platelets by anticoagulation protocol: Yes  Date/Time: aPTT/HL: Comment/Rate: 5/16 0711 130/0.84 SUPRAtherapeutic@1000units /hr 5/16 2228 >200/0.92 Supratherapeutic 5/17 0928 >200/0.81 Supratherapeutic 5/17 1910 HL 0.66 Therapeutic x 1 5/18 0302 HL 0.61 Therapeutic x 2   Plan:  - Heparin  level therapeutic x 2 - Continue heparin  infusion at 600 units/hr - Recheck HL daily w/ AM labs while therapeutic - Daily CBC, monitor for signs/symptoms of bleeding   Coretta Dexter,  PharmD, [facility] 04/17/2024 3:45 AM

## 2024-04-18 LAB — COMPREHENSIVE METABOLIC PANEL WITH GFR
ALT: 47 U/L — ABNORMAL HIGH (ref 0–44)
AST: 105 U/L — ABNORMAL HIGH (ref 15–41)
Albumin: 1.6 g/dL — ABNORMAL LOW (ref 3.5–5.0)
Alkaline Phosphatase: 539 U/L — ABNORMAL HIGH (ref 38–126)
Anion gap: 10 (ref 5–15)
BUN: 17 mg/dL (ref 8–23)
CO2: 21 mmol/L — ABNORMAL LOW (ref 22–32)
Calcium: 6.8 mg/dL — ABNORMAL LOW (ref 8.9–10.3)
Chloride: 108 mmol/L (ref 98–111)
Creatinine, Ser: 0.84 mg/dL (ref 0.44–1.00)
GFR, Estimated: >60 mL/min (ref >60)
Glucose, Bld: 136 mg/dL — ABNORMAL HIGH (ref 70–99)
Potassium: 3.3 mmol/L — ABNORMAL LOW (ref 3.5–5.1)
Sodium: 139 mmol/L (ref 135–145)
Total Bilirubin: 5 mg/dL — ABNORMAL HIGH (ref 0.0–1.2)
Total Protein: 5.6 g/dL — ABNORMAL LOW (ref 6.5–8.1)

## 2024-04-18 LAB — CBC WITH DIFFERENTIAL/PLATELET
Abs Immature Granulocytes: 0.59 10*3/uL — ABNORMAL HIGH (ref 0.00–0.07)
Basophils Absolute: 0 10*3/uL (ref 0.0–0.1)
Basophils Relative: 0 %
Eosinophils Absolute: 0.4 10*3/uL (ref 0.0–0.5)
Eosinophils Relative: 4 %
HCT: 25.3 % — ABNORMAL LOW (ref 36.0–46.0)
Hemoglobin: 8 g/dL — ABNORMAL LOW (ref 12.0–15.0)
Immature Granulocytes: 6 %
Lymphocytes Relative: 12 %
Lymphs Abs: 1.2 10*3/uL (ref 0.7–4.0)
MCH: 30.7 pg (ref 26.0–34.0)
MCHC: 31.6 g/dL (ref 30.0–36.0)
MCV: 96.9 fL (ref 80.0–100.0)
Monocytes Absolute: 0.4 10*3/uL (ref 0.1–1.0)
Monocytes Relative: 4 %
Neutro Abs: 7.7 10*3/uL (ref 1.7–7.7)
Neutrophils Relative %: 74 %
Platelets: 102 10*3/uL — ABNORMAL LOW (ref 150–400)
RBC: 2.61 MIL/uL — ABNORMAL LOW (ref 3.87–5.11)
RDW: 20.4 % — ABNORMAL HIGH (ref 11.5–15.5)
Smear Review: NORMAL
WBC: 10.3 10*3/uL (ref 4.0–10.5)
nRBC: 0 % (ref 0.0–0.2)

## 2024-04-18 LAB — CBC
HCT: 24.2 % — ABNORMAL LOW (ref 36.0–46.0)
Hemoglobin: 7.8 g/dL — ABNORMAL LOW (ref 12.0–15.0)
MCH: 31.2 pg (ref 26.0–34.0)
MCHC: 32.2 g/dL (ref 30.0–36.0)
MCV: 96.8 fL (ref 80.0–100.0)
Platelets: 94 10*3/uL — ABNORMAL LOW (ref 150–400)
RBC: 2.5 MIL/uL — ABNORMAL LOW (ref 3.87–5.11)
RDW: 20.2 % — ABNORMAL HIGH (ref 11.5–15.5)
WBC: 10 10*3/uL (ref 4.0–10.5)
nRBC: 0 % (ref 0.0–0.2)

## 2024-04-18 LAB — GLUCOSE, CAPILLARY
Glucose-Capillary: 105 mg/dL — ABNORMAL HIGH (ref 70–99)
Glucose-Capillary: 56 mg/dL — ABNORMAL LOW (ref 70–99)
Glucose-Capillary: 107 mg/dL — ABNORMAL HIGH (ref 70–99)
Glucose-Capillary: 50 mg/dL — ABNORMAL LOW (ref 70–99)
Glucose-Capillary: 112 mg/dL — ABNORMAL HIGH (ref 70–99)
Glucose-Capillary: 58 mg/dL — ABNORMAL LOW (ref 70–99)
Glucose-Capillary: 172 mg/dL — ABNORMAL HIGH (ref 70–99)
Glucose-Capillary: 133 mg/dL — ABNORMAL HIGH (ref 70–99)

## 2024-04-18 LAB — HEPARIN LEVEL (UNFRACTIONATED): Heparin Unfractionated: 0.35 [IU]/mL (ref 0.30–0.70)

## 2024-04-18 MED ORDER — APIXABAN 2.5 MG PO TABS
2.5000 mg | ORAL_TABLET | Freq: Two times a day (BID) | ORAL | Status: DC
  Administered 2024-04-18 – 2024-04-20 (×5): 2.5 mg via ORAL
  Filled 2024-04-18 (×5): qty 1

## 2024-04-18 MED ORDER — KCL IN DEXTROSE-NACL 20-5-0.45 MEQ/L-%-% IV SOLN
INTRAVENOUS | Status: AC
  Filled 2024-04-18 (×3): qty 1000

## 2024-04-18 MED ORDER — DICLOFENAC SODIUM 1 % EX GEL
4.0000 g | Freq: Four times a day (QID) | CUTANEOUS | Status: DC
  Administered 2024-04-18 – 2024-04-24 (×23): 4 g via TOPICAL
  Filled 2024-04-18 (×2): qty 100

## 2024-04-18 MED ORDER — CLOPIDOGREL BISULFATE 75 MG PO TABS
75.0000 mg | ORAL_TABLET | Freq: Every day | ORAL | Status: DC
  Administered 2024-04-18 – 2024-04-20 (×3): 75 mg via ORAL
  Filled 2024-04-18 (×3): qty 1

## 2024-04-18 MED ORDER — HYDROMORPHONE HCL 1 MG/ML IJ SOLN
1.0000 mg | INTRAMUSCULAR | Status: DC | PRN
  Administered 2024-04-18 (×2): 1 mg via INTRAVENOUS
  Filled 2024-04-18 (×2): qty 1

## 2024-04-18 NOTE — Progress Notes (Signed)
 Dr. Mosetta Areola aware of CBG dropping multiple times today.

## 2024-04-18 NOTE — Consult Note (Signed)
 PHARMACY - ANTICOAGULATION CONSULT NOTE  Pharmacy Consult for heparin  drip Indication: atrial fibrillation  Allergies  Allergen Reactions   Brilinta  [Ticagrelor ] Diarrhea and Nausea Only    Nausea and severe diarrhea- general weakness. Patient says does not want to take again   Budesonide  Palpitations   Albuterol  Other (See Comments)    Heart racing     Patient Measurements: Height: 5' (152.4 cm) Weight: 85.7 kg (188 lb 14.4 oz) IBW/kg (Calculated) : 45.5 HEPARIN  DW (KG): 66.1  Vital Signs: Temp: 98.6 F (37 C) (05/18 2307) Temp Source: Oral (05/18 2307) BP: 137/66 (05/18 2307) Pulse Rate: 98 (05/18 2307)  Labs: Recent Labs    04/15/24 0711 04/15/24 2228 04/15/24 2228 04/16/24 0928 04/16/24 1910 04/17/24 0302 04/17/24 1242 04/18/24 0517  HGB  --   --    < > 8.5*  --  8.0* 7.7*  --   HCT  --   --   --  25.8*  --  24.6* 23.7*  --   PLT  --   --   --  85*  --  80* 77*  --   APTT 130* >200*  --   --   --   --   --   --   HEPARINUNFRC 0.84* 0.92*  --  0.81* 0.66 0.61  --  0.35  CREATININE 1.00  --   --  1.05*  --   --  0.95  --    < > = values in this interval not displayed.    Estimated Creatinine Clearance: 48.2 mL/min (by C-G formula based on SCr of 0.95 mg/dL).   Medical History: Past Medical History:  Diagnosis Date   CAD in native artery    a. DES to ramus 2005 with late stent thrombosis 2006 tx with PTCA. 12/18 PCI/DES x1 to mRCA, EF 50-55%   CHF (congestive heart failure) (HCC)    Chronic pain    COPD (chronic obstructive pulmonary disease) (HCC)    Hyperlipidemia    Hypertension    Left bundle branch block    Lymphedema    Metastatic breast cancer    a. to bone.   MI (myocardial infarction) (HCC)    Mild aortic stenosis 10/2017   Morbid obesity (HCC)    PAF (paroxysmal atrial fibrillation) (HCC)    PSVT (paroxysmal supraventricular tachycardia) (HCC)    a. per [facility] notes, seen on event monitor in 2014.   Pulmonary nodules     Medications:   Medications Prior to Admission  Medication Sig Dispense Refill Last Dose/Taking   amLODipine  (NORVASC ) 5 MG tablet TAKE 1 TABLET EVERY DAY 90 tablet 3 04/14/2024   atorvastatin  (LIPITOR ) 80 MG tablet Take 80 mg by mouth daily.   04/14/2024   b complex vitamins capsule Take 1 capsule by mouth daily.   04/14/2024   carboxymethylcellulose (REFRESH PLUS) 0.5 % SOLN Place 1 drop into both eyes 3 (three) times daily as needed (Dry Eyes).   Taking As Needed   clopidogrel  (PLAVIX ) 75 MG tablet Take 75 mg by mouth daily.   04/14/2024   diclofenac  Sodium (VOLTAREN ) 1 % GEL Apply topically.   Taking   DULoxetine  (CYMBALTA ) 30 MG capsule Take 30 mg by mouth daily.   04/14/2024   ferrous sulfate  325 (65 FE) MG EC tablet Take 325 mg by mouth 3 (three) times daily with meals.   04/14/2024   HYDROmorphone  (DILAUDID ) 1 MG/ML injection Inject 0.5 mLs (0.5 mg total) into the vein every 2 (two) hours as  needed for severe pain (pain score 7-10) or moderate pain (pain score 4-6).   Taking As Needed   HYDROmorphone  (DILAUDID ) 2 MG tablet Take 4 mg by mouth every 4 (four) hours as needed for severe pain (pain score 7-10).   Taking As Needed   [EXPIRED] leptospermum manuka honey (MEDIHONEY) PSTE paste Apply 1 Application topically daily. 30 mL 0 04/14/2024   losartan  (COZAAR ) 100 MG tablet TAKE 1 TABLET EVERY DAY 90 tablet 3 04/14/2024   methocarbamol  (ROBAXIN ) 500 MG tablet Take 500 mg by mouth every 6 (six) hours as needed for muscle spasms.   Taking As Needed   metoprolol  succinate (TOPROL -XL) 25 MG 24 hr tablet Take 3 tablets (75 mg total) by mouth daily. 90 tablet 0 04/14/2024   nitroGLYCERIN  (NITROSTAT ) 0.4 MG SL tablet Place 0.4 mg under the tongue every 5 (five) minutes as needed for chest pain.   Taking As Needed   ondansetron  (ZOFRAN ) 4 MG/2ML SOLN injection Inject 2 mLs (4 mg total) into the vein every 6 (six) hours as needed for nausea or vomiting.   Taking As Needed   pantoprazole  (PROTONIX ) 40 MG tablet Take 1  tablet (40 mg total) by mouth 2 (two) times daily. 60 tablet 0 04/14/2024   apixaban  (ELIQUIS ) 5 MG TABS tablet Take 5 mg by mouth 2 (two) times daily. (Patient not taking: Reported on 04/15/2024)   Not Taking   Scheduled:   amLODipine   5 mg Oral Daily   DULoxetine   30 mg Oral Daily   ferrous sulfate   325 mg Oral TID WC   guaiFENesin   600 mg Oral BID   losartan   100 mg Oral Daily   metoprolol  succinate  75 mg Oral Daily   pantoprazole   40 mg Oral BID   polyethylene glycol  17 g Oral Daily   senna-docusate  1 tablet Oral BID   Infusions:   dextrose  5 % and 0.45 % NaCl with KCl 20 mEq/L 75 mL/hr at 04/18/24 0147   heparin  600 Units/hr (04/18/24 0156)   piperacillin -tazobactam (ZOSYN )  IV 3.375 g (04/18/24 0454)    Assessment: 78 yo female with PMH of hypertension, COPD, CAD, DES placement, diastolic CHF, A-fib on Eliquis , depression with anxiety, CKD-3B, PSVT, chronic pain, left bundle blockade, spinal stenosis, breast cancer metastasized to bone, left renal carcinoma, acute cholangitis and obstructive jaundice (s/p of ERCP). Hgb 8.5, Plt 85, aPTT >200, HL 0.81. PTA last dose of apixaban  was 5/13 (at home per son prior to going to [facility] ED).  She transferred from [facility] ED to [facility] 5/15. Heparin  held for [facility] performed on 5/16. Pharmacy consulted to resume heparin  dosing.  Goal of Therapy:  Heparin  level 0.3-0.7 units/ml aPTT 66-102  Monitor platelets by anticoagulation protocol: Yes  Date/Time: aPTT/HL: Comment/Rate: 5/16 0711 130/0.84 SUPRAtherapeutic@1000units /hr 5/16 2228 >200/0.92 Supratherapeutic 5/17 0928 >200/0.81 Supratherapeutic 5/17 1910 HL 0.66 Therapeutic x 1 5/18 0302 HL 0.61 Therapeutic x 2 5/19 0517 HL 0.35 Therapeutic x 3   Plan:  - Heparin  level therapeutic x 3 - Continue heparin  infusion at 600 units/hr - Recheck HL daily w/ AM labs while therapeutic - Daily CBC, monitor for signs/symptoms of bleeding   Coretta Dexter, PharmD,  [facility] 04/18/2024 6:24 AM

## 2024-04-18 NOTE — Progress Notes (Signed)
 PT Cancellation Note  Patient Details Name: Stacey [name] MRN: [id number] DOB: [birth year]   Cancelled Treatment:    Reason Eval/Treat Not Completed: PT screened, no needs identified, will sign off.  Per chart review of recent PT evaluation from last month "pt is bed bound at baseline and son helps her with all mobility and ADL's/IADL's. Needs assist to roll for care. Reports she has not even sat EOB for 2-3 years."  OT confirmed this date that pt is at her functional baseline with no skilled needs at this time, will complete PT orders.      Lavenia Post PT, DPT 04/18/24, 2:25 PM

## 2024-04-18 NOTE — Progress Notes (Signed)
 OT Cancellation Note  Patient Details Name: Stacey [name] MRN: [id number] DOB: 1946-09-28   Cancelled Treatment:    Reason Eval/Treat Not Completed: OT screened, no needs identified, will sign off. Patient evaluated by OT on 03/10/24 and found to be at baseline level of function (pt is bed bound and requires assist for all ADLs bed level).  OT confirmed with pt today - functional status is unchanged. Will complete OT orders at this time, MD notified.   Armando Bukhari L. Melanee Cordial, OTR/L  04/18/24, 2:00 PM

## 2024-04-19 ENCOUNTER — Inpatient Hospital Stay

## 2024-04-19 DIAGNOSIS — K8309 Other cholangitis: Secondary | ICD-10-CM | POA: Diagnosis not present

## 2024-04-19 LAB — CBC WITH DIFFERENTIAL/PLATELET
Abs Immature Granulocytes: 0.57 10*3/uL — ABNORMAL HIGH (ref 0.00–0.07)
Basophils Absolute: 0 10*3/uL (ref 0.0–0.1)
Basophils Relative: 0 %
Eosinophils Absolute: 0.4 10*3/uL (ref 0.0–0.5)
Eosinophils Relative: 4 %
HCT: 23.5 % — ABNORMAL LOW (ref 36.0–46.0)
Hemoglobin: 7.4 g/dL — ABNORMAL LOW (ref 12.0–15.0)
Immature Granulocytes: 6 %
Lymphocytes Relative: 11 %
Lymphs Abs: 1 10*3/uL (ref 0.7–4.0)
MCH: 31 pg (ref 26.0–34.0)
MCHC: 31.5 g/dL (ref 30.0–36.0)
MCV: 98.3 fL (ref 80.0–100.0)
Monocytes Absolute: 0.4 10*3/uL (ref 0.1–1.0)
Monocytes Relative: 5 %
Neutro Abs: 6.6 10*3/uL (ref 1.7–7.7)
Neutrophils Relative %: 74 %
Platelets: 117 10*3/uL — ABNORMAL LOW (ref 150–400)
RBC: 2.39 MIL/uL — ABNORMAL LOW (ref 3.87–5.11)
RDW: 20.3 % — ABNORMAL HIGH (ref 11.5–15.5)
Smear Review: NORMAL
WBC Morphology: INCREASED
WBC: 9 10*3/uL (ref 4.0–10.5)
nRBC: 0 % (ref 0.0–0.2)

## 2024-04-19 LAB — COMPREHENSIVE METABOLIC PANEL WITH GFR
ALT: 46 U/L — ABNORMAL HIGH (ref 0–44)
AST: 98 U/L — ABNORMAL HIGH (ref 15–41)
Albumin: 1.5 g/dL — ABNORMAL LOW (ref 3.5–5.0)
Alkaline Phosphatase: 548 U/L — ABNORMAL HIGH (ref 38–126)
Anion gap: 6 (ref 5–15)
BUN: 12 mg/dL (ref 8–23)
CO2: 22 mmol/L (ref 22–32)
Calcium: 6.6 mg/dL — ABNORMAL LOW (ref 8.9–10.3)
Chloride: 109 mmol/L (ref 98–111)
Creatinine, Ser: 0.74 mg/dL (ref 0.44–1.00)
GFR, Estimated: >60 mL/min (ref >60)
Glucose, Bld: 117 mg/dL — ABNORMAL HIGH (ref 70–99)
Potassium: 3.4 mmol/L — ABNORMAL LOW (ref 3.5–5.1)
Sodium: 137 mmol/L (ref 135–145)
Total Bilirubin: 4.2 mg/dL — ABNORMAL HIGH (ref 0.0–1.2)
Total Protein: 5.5 g/dL — ABNORMAL LOW (ref 6.5–8.1)

## 2024-04-19 LAB — CULTURE, BLOOD (ROUTINE X 2)
Culture: NO GROWTH
Culture: NO GROWTH
Special Requests: ADEQUATE
Special Requests: ADEQUATE

## 2024-04-19 LAB — GLUCOSE, CAPILLARY
Glucose-Capillary: 54 mg/dL — ABNORMAL LOW (ref 70–99)
Glucose-Capillary: 60 mg/dL — ABNORMAL LOW (ref 70–99)
Glucose-Capillary: 63 mg/dL — ABNORMAL LOW (ref 70–99)
Glucose-Capillary: 70 mg/dL (ref 70–99)
Glucose-Capillary: 73 mg/dL (ref 70–99)
Glucose-Capillary: 81 mg/dL (ref 70–99)
Glucose-Capillary: 93 mg/dL (ref 70–99)
Glucose-Capillary: 98 mg/dL (ref 70–99)

## 2024-04-19 LAB — GLUCOSE, RANDOM: Glucose, Bld: 122 mg/dL — ABNORMAL HIGH (ref 70–99)

## 2024-04-19 LAB — CYTOLOGY - NON PAP

## 2024-04-19 MED ORDER — CHLORHEXIDINE GLUCONATE CLOTH 2 % EX PADS
6.0000 | MEDICATED_PAD | Freq: Every day | CUTANEOUS | Status: AC
  Administered 2024-04-19 – 2024-04-23 (×5): 6 via TOPICAL

## 2024-04-19 MED ORDER — BOOST / RESOURCE BREEZE PO LIQD CUSTOM
1.0000 | Freq: Three times a day (TID) | ORAL | Status: DC
  Administered 2024-04-20 (×2): 1 via ORAL
  Administered 2024-04-21: 237 mL via ORAL
  Administered 2024-04-21 – 2024-04-23 (×6): 1 via ORAL

## 2024-04-19 MED ORDER — ENSURE MAX PROTEIN PO LIQD
11.0000 [oz_av] | Freq: Every day | ORAL | Status: DC
  Filled 2024-04-19: qty 330

## 2024-04-19 MED ORDER — ADULT MULTIVITAMIN W/MINERALS CH
1.0000 | ORAL_TABLET | Freq: Every day | ORAL | Status: DC
  Administered 2024-04-19 – 2024-04-24 (×6): 1 via ORAL
  Filled 2024-04-19 (×6): qty 1

## 2024-04-19 MED ORDER — KCL IN DEXTROSE-NACL 20-5-0.45 MEQ/L-%-% IV SOLN
INTRAVENOUS | Status: DC
  Filled 2024-04-19 (×3): qty 1000

## 2024-04-19 MED ORDER — PROSOURCE PLUS PO LIQD
30.0000 mL | Freq: Three times a day (TID) | ORAL | Status: DC
  Administered 2024-04-19 – 2024-04-23 (×9): 30 mL via ORAL
  Filled 2024-04-19 (×17): qty 30

## 2024-04-19 MED ORDER — MUPIROCIN 2 % EX OINT
1.0000 | TOPICAL_OINTMENT | Freq: Two times a day (BID) | CUTANEOUS | Status: AC
  Administered 2024-04-19 – 2024-04-23 (×9): 1 via NASAL
  Filled 2024-04-19: qty 22

## 2024-04-19 MED ORDER — VITAMIN C 500 MG PO TABS
500.0000 mg | ORAL_TABLET | Freq: Two times a day (BID) | ORAL | Status: DC
  Administered 2024-04-19 – 2024-04-24 (×10): 500 mg via ORAL
  Filled 2024-04-19 (×10): qty 1

## 2024-04-19 MED ORDER — ZINC SULFATE 220 (50 ZN) MG PO CAPS
220.0000 mg | ORAL_CAPSULE | Freq: Every day | ORAL | Status: DC
  Administered 2024-04-19 – 2024-04-24 (×6): 220 mg via ORAL
  Filled 2024-04-19 (×6): qty 1

## 2024-04-19 NOTE — Plan of Care (Signed)

## 2024-04-19 NOTE — Progress Notes (Signed)
 PROGRESS NOTE    Stacey  B [place] [name]  ZOX:[id number] DOB: 1946-01-12 DOA: 04/14/2024 PCP: [name], MD    Brief Narrative:  78 y.o. female with medical history significant of hypertension, COPD, CAD, DES placement, diastolic CHF, A-fib on Eliquis , depression with anxiety, CKD-3B, PSVT, chronic pain, left bundle blockade, spinal stenosis, breast cancer metastasized to bone, left renal carcinoma, acute cholangitis and obstructive jaundice (s/p of ERCP), who presented to [facility] due to nausea, vomiting, right upper quadrant abdominal pain. Pt was accepted for transferring from [facility] per Dr. Isom Marin of [facility] ED on 5/13, arrived today.     Patient was recently hospitalized to [facility] on 4/9 - 4/18 due to acute cholangitis and obstructive jaundice.  Patient is s/p of ERCP, found to have duodenal ulcer, duodenal and sphincter stenosis.  Stent was placed and the purulent discharge was removed.    Patient states that she has nausea, multiple episodes of nonbilious nonbloody vomiting and abdominal pain in the past several days.  No fever or chills.  Abdominal pain is located in the upper abdomen, constant, mild to moderate, aching, nonradiating.  No diarrhea.  No fever or chills.  She states she had some chest discomfort yesterday, which has resolved, currently no active chest pain.  She has mild dry cough, mild SOB due to COPD.  No symptoms of UTI.   CTA of abdomen/pelvis done in [facility] showed severe intra- and extrahepatic biliary ductal dilation.  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  Common bile duct diffuse mucosa hyperenhancement, nonspecific, but can be seen with ascending cholangitis.  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  Osseous metastases not significantly changed.   Liver function with ALP 895, AST 135, ALT 41, total bilirubin 7.1, direct bilirubin 6.09.  Renal function stable, WBC 7.2, INR 1.23.  Temperature normal,  initial blood pressure low with SBP in 80s, which improved to 109/63 after 30 cc/kg normal saline resuscitation.  Heart rate 100, RR 15, oxygen  saturation 99% on room air.  Chest x-ray negative for infiltration. Pt was given Zosyn  in ED.   Dr. Isom Marin of ED in [facility] consulted Dr. Bridgett Camps of GI who reached to Dr. Ole Berkeley of [facility] GI. Dr. Ole Berkeley agreed to see pt after arrival to [facility]. Pt is accepted to PCU as inpt.  5/16: ERCP today. 5/20: Patient remains weak and fatigued.  Persistent hypoglycemia due to poor oral intake.  RD engaged for comment today.  Assessment & Plan:   Principal Problem:   Ascending cholangitis Active Problems:   Biliary obstruction   Atrial fibrillation, chronic (HCC)   CAD (coronary artery disease)   HLD (hyperlipidemia)   Chronic diastolic CHF (congestive heart failure) (HCC)   Chronic kidney disease, stage 3b (HCC)   CAD in native artery   COPD (chronic obstructive pulmonary disease) (HCC)   HTN (hypertension)   Thrombocytopenia (HCC)   Anxiety and depression   Bipolar 2 disorder, major depressive episode (HCC)   Obesity, Class II, BMI 35-39.9  Ascending cholangitis and biliary obstruction:  Patient has WBC 23.6, no fever, clinically does not seem to have sepsis. GI consulted on admission Status post ERCP for ascending cholangitis.  Fully covered metal stent placed.  Bilirubin and LFTs downtrending.  Per GI this type of stent can be removed in the future. Plan: Bilirubin slowly downtrending.  Continue IV antibiotics for now.  Continue oral intake.  If sugars remain low continue D5 half-normal saline  Recurrent hypoglycemia  Poor p.o. intake Hypoalbuminemia Suspected malnutrition Patient's oral intake has been negligent in the setting of recent biliary stent placement.  This is likely in the setting of significant metastatic disease. Plan: RD consultation Resume D5 half-normal saline  Atrial fibrillation, chronic (HCC):  Eliquis  resumed 5/19   CAD  (coronary artery disease): Patent with no chest pain Plavix  resumed.  Lipitor  remains on hold   HLD (hyperlipidemia) Lipitor  hold   Chronic diastolic CHF (congestive heart failure) (HCC): 2D echo 03/25/21 showed EF of 50-55% with grade 1 diastolic dysfunction.  BNP is elevated 406, but patient does not have leg edema or JVD.  Does not seem to have CHF exacerbation. No indication for diuretics.  Continue to monitor volume status   Chronic kidney disease, stage 3b [facility]): Renal function stable.  Recent baseline creatinine 1.45 on 03/23/2024.  Her creatinine is 1.06, BUN 32. Creatinine appears at baseline.  Continue monitor.  Avoid nephrotoxins   COPD (chronic obstructive pulmonary disease) (HCC): Stable Continue as needed bronchodilators   HTN (hypertension) Continue home Norvasc , Cozaar , Lopressor    Thrombocytopenia (HCC): Platelet 93.   Appears mild and chronic.  No bleeding noted. Monitor carefully while on anticoagulation and antiplatelet   Anxiety and depression and bipolar 2 disorder, major depressive episode (HCC) Continue home Cymbalta   Obesity, Class II, BMI 35-39.9: Body weight 87.5 kg, BMI 37.69 Encouraged weight loss and diet.  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  Resume outpatient monitoring on discharge  DVT prophylaxis:Eliquis  Code Status: DNR Family Communication: Son Luellen Sages [phone] on 5/20, left VM for daughter Darrall Ellison on 5/20 Disposition Plan: Status is: Inpatient Remains inpatient appropriate because: Multiple acute issues as above   Level of care: Progressive  Consultants:  GI  Procedures:  ERCP  Antimicrobials: Zosyn    Subjective: Seen and examined.  More lethargic this morning.  Objective: Vitals:   04/18/24 2356 04/19/24 0415 04/19/24 0805 04/19/24 1153  BP: 125/78 138/82 117/78 (!) 148/78  Pulse: 88 (!) 108 95 99  Resp: 20 19 18 16   Temp: 98.3  F (36.8 C) 98.5 F (36.9 C) 98.3 F (36.8 C) 98.2 F (36.8 C)  TempSrc: Oral Oral Oral Oral  SpO2: 100% 99% 100% 100%  Weight:  85.1 kg    Height:        Intake/Output Summary (Last 24 hours) at 04/19/2024 1350 Last data filed at 04/19/2024 1333 Gross per 24 hour  Intake 1622.1 ml  Output 1200 ml  Net 422.1 ml   Filed Weights   04/16/24 0447 04/18/24 0600 04/19/24 0415  Weight: 86.1 kg 85.7 kg 85.1 kg    Examination:  General exam: Appears frail, fatigued, chronically ill Respiratory system: Poor respiratory effort.  Lungs clear.  Room air Cardiovascular system: S1-S2, RRR, no murmurs, no pedal edema Gastrointestinal system: Obese, nondistended, RUQ TTP, normal bowel sounds Central nervous system: Alert lethargic.  Unable to assess orientation Extremities: Symmetric 5 x 5 power. Skin: No rashes, lesions or ulcers Psychiatry: Judgement and insight appear impaired. Mood & affect flattened.     Data Reviewed: I have personally reviewed following labs and imaging studies  CBC: Recent Labs  Lab 04/14/24 1912 04/15/24 0444 04/16/24 0928 04/17/24 0302 04/17/24 1242 04/18/24 0516 04/18/24 1333 04/19/24 0412  WBC 23.6*   < > 15.6* 11.5* 9.1 10.3 10.0 9.0  NEUTROABS 22.2*  --  13.4*  --   --  7.7  --  6.6  HGB 9.8*   < > 8.5* 8.0* 7.7* 8.0* 7.8* 7.4*  HCT 30.7*   < > 25.8* 24.6* 23.7* 25.3* 24.2* 23.5*  MCV 96.5   < > 94.9 94.6 95.6 96.9 96.8 98.3  PLT 93*   < > 85* 80* 77* 102* 94* 117*   < > = values in this interval not displayed.   Basic Metabolic Panel: Recent Labs  Lab 04/15/24 0711 04/16/24 0626 04/16/24 0928 04/17/24 1242 04/18/24 1333 04/19/24 0412  NA 139  --  136 140 139 137  K 3.9  --  3.3* 3.0* 3.3* 3.4*  CL 108  --  107 112* 108 109  CO2 18*  --  21* 20* 21* 22  GLUCOSE 87  --  124* 90 136* 117*  BUN 30*  --  29* 24* 17 12  CREATININE 1.00  --  1.05* 0.95 0.84 0.74  CALCIUM  7.2*  --  6.8* 6.6* 6.8* 6.6*  MG  --  2.0  --   --   --   --     GFR: Estimated Creatinine Clearance: 57 mL/min (by C-G formula based on SCr of 0.74 mg/dL). Liver Function Tests: Recent Labs  Lab 04/15/24 0711 04/16/24 0928 04/17/24 1242 04/18/24 1333 04/19/24 0412  AST 190* 163* 132* 105* 98*  ALT 64* 59* 51* 47* 46*  ALKPHOS 611* 588* 541* 539* 548*  BILITOT 8.4* 5.6* 5.0* 5.0* 4.2*  PROT 6.0* 6.0* 5.3* 5.6* 5.5*  ALBUMIN  1.9* 1.9* 1.7* 1.6* 1.5*   Recent Labs  Lab 04/16/24 0928  LIPASE 87*   No results for input(s): "AMMONIA" in the last 168 hours. Coagulation Profile: Recent Labs  Lab 04/14/24 1912  INR 1.2   Cardiac Enzymes: No results for input(s): "CKTOTAL", "CKMB", "CKMBINDEX", "TROPONINI" in the last 168 hours. BNP (last 3 results) No results for input(s): "PROBNP" in the last 8760 hours. HbA1C: No results for input(s): "HGBA1C" in the last 72 hours. CBG: Recent Labs  Lab 04/19/24 0017 04/19/24 0417 04/19/24 0431 04/19/24 0804 04/19/24 1149  GLUCAP 73 60* 70 98 93   Lipid Profile: No results for input(s): "CHOL", "HDL", "LDLCALC", "TRIG", "CHOLHDL", "LDLDIRECT" in the last 72 hours. Thyroid  Function Tests: No results for input(s): "TSH", "T4TOTAL", "FREET4", "T3FREE", "THYROIDAB" in the last 72 hours. Anemia Panel: No results for input(s): "VITAMINB12", "FOLATE", "FERRITIN", "TIBC", "IRON", "RETICCTPCT" in the last 72 hours. Sepsis Labs: No results for input(s): "PROCALCITON", "LATICACIDVEN" in the last 168 hours.  Recent Results (from the past 240 hours)  Culture, blood (Routine X 2) w Reflex to ID Panel     Status: None   Collection Time: 04/14/24  7:12 PM   Specimen: BLOOD  Result Value Ref Range Status   Specimen Description BLOOD BLOOD LEFT HAND  Final   Special Requests   Final    BOTTLES DRAWN AEROBIC AND ANAEROBIC Blood Culture adequate volume   Culture   Final    NO GROWTH 5 DAYS Performed at [facility], [address] [place], [place] [address]    Report Status 04/19/2024 FINAL   Final  Culture, blood (Routine X 2) w Reflex to ID Panel     Status: None   Collection Time: 04/14/24  7:13 PM   Specimen: BLOOD  Result Value Ref Range Status   Specimen Description BLOOD BLOOD LEFT ARM  Final   Special Requests   Final    BOTTLES DRAWN AEROBIC AND ANAEROBIC Blood Culture adequate volume   Culture   Final  NO GROWTH 5 DAYS Performed at [facility], [address] [place] [place], [place] [address]    Report Status 04/19/2024 FINAL  Final  MRSA Next Gen by PCR, Nasal     Status: Abnormal   Collection Time: 04/15/24  5:40 AM   Specimen: Nasal Mucosa; Nasal Swab  Result Value Ref Range Status   MRSA by PCR Next Gen DETECTED (A) NOT DETECTED Final    Comment: RESULT CALLED TO, READ BACK BY AND VERIFIED WITH: Diallo, Boubacar P, RN 04/15/24 0719 MW (NOTE) The GeneXpert MRSA Assay (FDA approved for NASAL specimens only), is one component of a comprehensive MRSA colonization surveillance program. It is not intended to diagnose MRSA infection nor to guide or monitor treatment for MRSA infections. Test performance is not FDA approved in patients less than 55 years old. Performed at [facility], [address], [place], [place] [address]          Radiology Studies: No results found.       Scheduled Meds:  amLODipine   5 mg Oral Daily   apixaban   2.5 mg Oral BID   Chlorhexidine  Gluconate Cloth  6 each Topical Daily   clopidogrel   75 mg Oral Daily   diclofenac  Sodium  4 g Topical QID   DULoxetine   30 mg Oral Daily   ferrous sulfate   325 mg Oral TID WC   guaiFENesin   600 mg Oral BID   losartan   100 mg Oral Daily   metoprolol  succinate  75 mg Oral Daily   mupirocin  ointment  1 Application Nasal BID   pantoprazole   40 mg Oral BID   polyethylene glycol  17 g Oral Daily   Ensure Max Protein  11 oz Oral Daily   senna-docusate  1 tablet Oral BID   Continuous Infusions:  dextrose  5 % and 0.45 % NaCl with KCl 20 mEq/L      piperacillin -tazobactam (ZOSYN )  IV 3.375 g (04/19/24 1201)     LOS: 5 days     Tiajuana Fluke, MD [facility]   If 7PM-7AM, please contact night-coverage  04/19/2024, 1:50 PM

## 2024-04-19 NOTE — Progress Notes (Signed)
 Initial Nutrition Assessment  DOCUMENTATION CODES:   Obesity unspecified  INTERVENTION:   -D/c Ensure Max -Continue regular diet -MVI with minerals daily -500 mg vitamin C BID -220 mg zinc sulfate daily x 14 days -Boost Breeze po TID, each supplement provides 250 kcal and 9 grams of protein  -30 ml Prosource Plus TID, each supplement provides 100 kcals and 15 grams protein  -If NGT is placed, recommend:  Initiate Osmolite 1.2 @ 20 ml/hr  and increase by 10 ml every 12 hours to goal rate of 60 ml/hr.   30 ml free water  flush every 4 hours  Tube feeding regimen provides 1728 kcal (100% of needs), 80 grams of protein, and 1181 ml of H2O. Total free water : 1361 ml daily  -If feedings are initiated, recommend:  -100 mg thiamine daily x 7 days -Monitor Mg, K, and Phos and replete as needed secondary to high refeeding risk   NUTRITION DIAGNOSIS:   Inadequate oral intake related to poor appetite as evidenced by per patient/family report.  GOAL:   Patient will meet greater than or equal to 90% of their needs  MONITOR:   PO intake, Supplement acceptance  REASON FOR ASSESSMENT:   Consult Assessment of nutrition requirement/status  ASSESSMENT:   Pt with medical history significant of hypertension, COPD, CAD, DES placement, diastolic CHF, A-fib on Eliquis , depression with anxiety, CKD-3B, PSVT, chronic pain, left bundle blockade, spinal stenosis, breast cancer metastasized to bone, left renal carcinoma, acute cholangitis and obstructive jaundice (s/p of ERCP), who presented to [facility] due to nausea, vomiting, right upper quadrant abdominal pain.  Pt admitted with ascending cholangitis and biliary obstruction.   5/16- s/p ERCP- stent removed from common bile duct, biliary tree was swpt (pus found), one covered metal stent placed back into bile duct  Reviewed I/O's: +665 ml x 24 hours and -1.2 L since admission  UOP: 1.2 L x 24 hours  Per RN notes, noted pt with stage 2 to  sacrum on admission.   Per MD, concern for malnutrition, poor oral intake, and dropping CBGS.   Pt sleeping at time of visit. No family present. Pt opened her eyes briefly when awoke, but difficult to stay awake enough to answer more than close ended questions. Noted pt is jaundiced.Pt endorses poor appetite; states she does not feel hungry and that eating often hurts her stomach. She does not like the Ensure supplements due to this. Pt reports she is willing to try supplements that are not milk based to see if this helps minimize pain with eating. Given lethargy, unsure of accuracy of information.   Reviewed wt hx; pt has experienced a 12.7% wt loss over the past year, which is not significant for time frame.   Case discussed with RN, who reports pt was recently given pain medications, which is likely contributing to drowsiness. Per RN, pt is not very talkative at baseline and did not have difficulty taking medications this morning.   While pt does not meet criteria for malnutrition at this time, pt is is at high for malnutrition, especially if oral intake does not improve. Pt also with moderate edema, which is likely masking true weight loss as well as fat and muscle depletions. Case discussed with MD. Recommending consideration of NGT if pt does not improve vs palliative care consult for goals of care discussions.   Medications reviewed and include ferrous sulfate , protonix , miralax , and senokot.   Labs reviewed: K: 3.4, Phos: 1.9,  CBGS: 60-133 (inpatient orders for glycemic  control are none).    NUTRITION - FOCUSED PHYSICAL EXAM:  Flowsheet Row Most Recent Value  Orbital Region No depletion  Upper Arm Region Mild depletion  Thoracic and Lumbar Region No depletion  Buccal Region No depletion  Temple Region Mild depletion  Clavicle Bone Region No depletion  Clavicle and Acromion Bone Region No depletion  Scapular Bone Region No depletion  Dorsal Hand Mild depletion  Patellar Region No  depletion  Anterior Thigh Region No depletion  Posterior Calf Region No depletion  Edema (RD Assessment) Moderate  Hair Reviewed  Eyes Reviewed  Mouth Reviewed  Skin Reviewed  Nails Reviewed       Diet Order:   Diet Order             Diet regular Fluid consistency: Thin  Diet effective now                   EDUCATION NEEDS:   No education needs have been identified at this time  Skin:  Skin Assessment: Skin Integrity Issues: Skin Integrity Issues:: Stage II, Incisions Stage II: sacrum Incisions: lt hip (draining)  Last BM:  04/19/24 (type 5)  Height:   Ht Readings from Last 1 Encounters:  04/14/24 5' (1.524 m)    Weight:   Wt Readings from Last 1 Encounters:  04/19/24 85.1 kg    Ideal Body Weight:  45.5 kg  BMI:  Body mass index is 36.64 kg/m.  Estimated Nutritional Needs:   Kcal:  1600-1800  Protein:  80-95 grams  Fluid:  1.6-1.8 L    Herschel Lords, RD, LDN, CDCES Registered Dietitian III Certified Diabetes Care and Education Specialist If unable to reach this RD, please use "RD Inpatient" group chat on secure chat between hours of 8am-4 pm daily

## 2024-04-19 NOTE — Progress Notes (Signed)
 Dr. Mosetta Areola notified of patient having belly pain along with nausea/vomiting and poor PO intake. CT scan ordered. Also notified about CBG and lab draw values not matching. For any further low CBG finger sticks, we will obtain lab draw to ensure accuracy.

## 2024-04-20 ENCOUNTER — Inpatient Hospital Stay: Payer: Medicare Other | Admitting: Hematology

## 2024-04-20 ENCOUNTER — Inpatient Hospital Stay: Payer: Medicare Other

## 2024-04-20 DIAGNOSIS — C50911 Malignant neoplasm of unspecified site of right female breast: Secondary | ICD-10-CM | POA: Diagnosis not present

## 2024-04-20 DIAGNOSIS — C7951 Secondary malignant neoplasm of bone: Secondary | ICD-10-CM

## 2024-04-20 DIAGNOSIS — I5032 Chronic diastolic (congestive) heart failure: Secondary | ICD-10-CM | POA: Diagnosis not present

## 2024-04-20 DIAGNOSIS — K769 Liver disease, unspecified: Secondary | ICD-10-CM | POA: Diagnosis not present

## 2024-04-20 DIAGNOSIS — Z170 Estrogen receptor positive status [ER+]: Secondary | ICD-10-CM

## 2024-04-20 DIAGNOSIS — J449 Chronic obstructive pulmonary disease, unspecified: Secondary | ICD-10-CM | POA: Diagnosis not present

## 2024-04-20 DIAGNOSIS — K8309 Other cholangitis: Secondary | ICD-10-CM | POA: Diagnosis not present

## 2024-04-20 DIAGNOSIS — K831 Obstruction of bile duct: Secondary | ICD-10-CM | POA: Diagnosis not present

## 2024-04-20 DIAGNOSIS — Z515 Encounter for palliative care: Secondary | ICD-10-CM

## 2024-04-20 LAB — GLUCOSE, CAPILLARY
Glucose-Capillary: 55 mg/dL — ABNORMAL LOW (ref 70–99)
Glucose-Capillary: 42 mg/dL — CL (ref 70–99)
Glucose-Capillary: 50 mg/dL — ABNORMAL LOW (ref 70–99)
Glucose-Capillary: 122 mg/dL — ABNORMAL HIGH (ref 70–99)
Glucose-Capillary: 123 mg/dL — ABNORMAL HIGH (ref 70–99)
Glucose-Capillary: 64 mg/dL — ABNORMAL LOW (ref 70–99)
Glucose-Capillary: 92 mg/dL (ref 70–99)

## 2024-04-20 LAB — PROTIME-INR
INR: 1.2 (ref 0.8–1.2)
Prothrombin Time: 15.5 s — ABNORMAL HIGH (ref 11.4–15.2)

## 2024-04-20 MED ORDER — APIXABAN 5 MG PO TABS: 5.0000 mg | ORAL_TABLET | Freq: Two times a day (BID) | ORAL | Status: DC

## 2024-04-20 MED ORDER — MELATONIN 5 MG PO TABS
5.0000 mg | ORAL_TABLET | Freq: Every evening | ORAL | Status: DC | PRN
Start: 2024-04-20 — End: 2024-04-24
  Administered 2024-04-20 – 2024-04-23 (×3): 5 mg via ORAL
  Filled 2024-04-20 (×3): qty 1

## 2024-04-20 MED ORDER — HEPARIN (PORCINE) 25000 UT/250ML-% IV SOLN
950.0000 [IU]/h | INTRAVENOUS | Status: DC
  Administered 2024-04-20: 950 [IU]/h via INTRAVENOUS
  Filled 2024-04-20: qty 250

## 2024-04-20 NOTE — Consult Note (Addendum)
 Hematology/Oncology Consult note [phone] [phone]      Patient Care Team: [name], MD as PCP - General (Internal Medicine) Amanda Jungling Joyceann No, MD as PCP - Cardiology (Cardiology) Gerhard Knuckles, RN as Oncology Nurse Navigator (Oncology)   Name of the patient: Stacey [name]  [id number]  07-Nov-1946   REASON FOR COSULTATION:   History of presenting illness-  78 y.o. female with history of CAD status post stent, hypertension, atrial fibrillation, CKD.  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  Due to obstructive jaundice due to CBD stricture status post ERCP with plastic stent placement, presented to emergency room for evaluation of nausea vomiting abdominal pain. Denies fever or chills. 04/15/2024 status post repeat ERCP with replacement of metal stent.  04/12/2024 CT abdomen pelvis with IV contrast done at [facility] showed 1.    Severe intra- and extrahepatic biliary ductal dilation. 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 Correlation with prior outside imaging and pathology would be useful.  2.    Common bile duct diffuse mucosal hyperenhancement, non-specific but can be seen with ascending cholangitis.  3.    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  4.    Osseous metastases not significantly changed.   04/19/2024, CT abdomen pelvis without contrast showed Interval development of multiple new hepatic hypodensities measuring up to 1.8 cm, worrisome for progressive metastatic disease.  Unchanged thicK wall left renal cystic lesion consistent with RCC. Small bilateral pleural effusions slightly larger on the right than the left.  Small volume of ascites.  Anasarca Unchanged bony metastasis  in L1, L2 with severe pathological compression fracture at L1 with moderate canal narrowing.  Patient has history of metastatic breast cancer follows up with [facility] oncology.  Patient and family request oncology consultation I reviewed patient previous oncology records at [facility]. She was last seen by Dr. Clarance Cristal on 09/29/2023. Metastatic breast cancer was diagnosed initially in 2017.  ER/PR positive, HER2 equivocal by FISH. Patient was initially started on letrozole  for endocrine therapy.  In February 2020, patient has developed disease progression with increased soft tissue extension at L2.  She was transition to fulvestrant  at that time.  CDK 4/6 inhibitor Anibal Kent was recommended and patient declined due to cost of medication. Patient gets fulvestrant  monthly as well as Xgeva  monthly.  Per oncology note, patient continued to refuse CDK 4/6 inhibitor therapy due to financial reason  Patient lives at home with her son. Patient is currently on IV antibiotics for ascending cholangitis.   Allergies  Allergen Reactions   Brilinta  [Ticagrelor ] Diarrhea and Nausea Only    Nausea and severe diarrhea- general weakness. 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   Ascending cholangitis 03/09/2024   Acute metabolic  encephalopathy 03/08/2024   Bronchospasm 03/24/2022   Acute bronchospasm 03/23/2022   Elevated brain natriuretic  peptide (BNP) level 03/23/2022   Macrocytic anemia 03/23/2022   Incarcerated umbilical hernia    Cholecystitis    Jaundice    Cholecystitis, acute with cholelithiasis 03/25/2021   Abdominal pain 03/25/2021   Leukocytosis 03/25/2021   Hypokalemia 03/25/2021   Hyperglycemia 03/25/2021   Chronic diastolic CHF (congestive heart failure) (HCC) 03/25/2021   Obesity, Class II, BMI 35-39.9 03/25/2021   Choledocholithiasis    Rectal bleeding 08/23/2020   Supratherapeutic INR 08/23/2020   Left hip pain    Pain of metastatic malignancy 06/27/2020   Malignant neoplasm of right breast in female, estrogen receptor positive (HCC)    Goals of care, counseling/discussion    Advanced care planning/counseling discussion    Spinal stenosis of lumbar region    Malignant neoplasm metastatic to bone [facility])    Palliative care by specialist    Lumbar pain 04/24/2020   Lumbar radiculopathy 02/16/2020   Palpitations 11/16/2019   Dysuria    Acute bilateral low back pain 10/02/2019   Bipolar 2 disorder, major depressive episode (HCC) 09/01/2019   Adjustment disorder with depressed mood 06/30/2019   Atrial fibrillation with rapid ventricular response (HCC) 11/14/2018   Fall    Obesity, Class III, BMI 40-49.9 (morbid obesity)    Chronic respiratory failure with hypoxia (HCC)    Transaminitis 10/07/2018   PSVT (paroxysmal supraventricular tachycardia) (HCC) 10/07/2018   HLD (hyperlipidemia) 10/07/2018   COPD (chronic obstructive pulmonary disease) (HCC) 10/07/2018   CAD in native artery 10/07/2018   Chronic pain 10/07/2018   Acute back pain 10/07/2018   Acute hypoxemic respiratory failure (HCC) 09/19/2018   Acute on chronic combined systolic and diastolic CHF (congestive heart failure) (HCC) 09/19/2018   COPD with acute exacerbation (HCC) 09/19/2018   Dyspnea 11/30/2017   Non-ST elevation (NSTEMI) myocardial infarction (HCC)    HTN (hypertension) 11/23/2017   Left bundle branch block 11/23/2017    Acute on chronic diastolic CHF (congestive heart failure) (HCC) 11/23/2017   CAD (coronary artery disease) 11/23/2017   Primary malignant neoplasm of breast with metastasis (HCC) 11/23/2017   Acute respiratory failure with hypoxia (HCC) 11/23/2017   Atypical nevus 07/29/2016   Age-related nuclear cataract, bilateral 02/12/2016   Anxiety and depression 01/24/2016   Arthropathy, lower leg 09/14/2013   Tibialis tendinitis 02/22/2013   Plantar fasciitis 01/17/2013     Past Medical History:  Diagnosis Date   CAD in native artery    a. DES to ramus 2005 with late stent thrombosis 2006 tx with PTCA. 12/18 PCI/DES x1 to mRCA, EF 50-55%   CHF (congestive heart failure) (HCC)    Chronic pain    COPD (chronic obstructive pulmonary disease) (HCC)    Hyperlipidemia    Hypertension    Left bundle branch block    Lymphedema    Metastatic breast cancer    a. to bone.   MI (myocardial infarction) (HCC)    Mild aortic stenosis 10/2017   Morbid obesity (HCC)    PAF (paroxysmal atrial fibrillation) (HCC)    PSVT (paroxysmal supraventricular tachycardia) (HCC)    a. per [facility] notes, seen on event monitor in 2014.   Pulmonary nodules      Past Surgical History:  Procedure Laterality Date   BILIARY STENT PLACEMENT  03/09/2024   Procedure: INSERTION, STENT, BILE DUCT;  Surgeon: Marnee Sink, MD;  Location: [facility] ENDOSCOPY;  Service: Endoscopy;;   CHOLECYSTECTOMY N/A 03/29/2021   Procedure:  LAPAROSCOPIC CHOLECYSTECTOMY;  Surgeon: Awilda Bogus, MD;  Location: [facility] ORS;  Service: General;  Laterality: N/A;   CORONARY STENT INTERVENTION N/A 11/26/2017   Procedure: CORONARY STENT INTERVENTION;  Surgeon: Swaziland, Peter M, MD;  Location: [facility] INVASIVE CV LAB;  Service: Cardiovascular;  Laterality: N/A;   CORONARY STENT PLACEMENT     ERCP N/A 03/28/2021   Procedure: ENDOSCOPIC RETROGRADE CHOLANGIOPANCREATOGRAPHY (ERCP);  Surgeon: Ruby Corporal, MD;  Location: [facility] ORS;  Service: Endoscopy;  Laterality:  N/A;   ERCP N/A 03/09/2024   Procedure: ERCP, WITH INTERVENTION IF INDICATED;  Surgeon: Marnee Sink, MD;  Location: [facility] ENDOSCOPY;  Service: Endoscopy;  Laterality: N/A;   ERCP N/A 04/15/2024   Procedure: ERCP, WITH INTERVENTION IF INDICATED;  Surgeon: Marnee Sink, MD;  Location: [facility] ENDOSCOPY;  Service: Endoscopy;  Laterality: N/A;   ESOPHAGOGASTRODUODENOSCOPY  03/09/2024   Procedure: EGD (ESOPHAGOGASTRODUODENOSCOPY);  Surgeon: Marnee Sink, MD;  Location: [facility] ENDOSCOPY;  Service: Endoscopy;;   intrathecal pain pump     LEFT HEART CATH AND CORONARY ANGIOGRAPHY N/A 11/26/2017   Procedure: LEFT HEART CATH AND CORONARY ANGIOGRAPHY;  Surgeon: Swaziland, Peter M, MD;  Location: [facility] INVASIVE CV LAB;  Service: Cardiovascular;  Laterality: N/A;   REMOVAL OF STONES  03/28/2021   Procedure: REMOVAL OF STONES;  Surgeon: Ruby Corporal, MD;  Location: [facility];  Service: Endoscopy;;   SPHINCTEROTOMY  03/28/2021   Procedure: SPHINCTEROTOMY;  Surgeon: Ruby Corporal, MD;  Location: [facility];  Service: Endoscopy;;   UMBILICAL HERNIA REPAIR N/A 04/03/2021   Procedure: ADULT PRIMARY UMBILICAL HERNIA REPAIR;  Surgeon: Awilda Bogus, MD;  Location: [facility];  Service: General;  Laterality: N/A;   VASCULAR SURGERY      Social History   Socioeconomic History   Marital status: Single    Spouse name: Not on file   Number of children: Not on file   Years of education: Not on file   Highest education level: Not on file  Occupational History   Occupation: Retired  Tobacco Use   Smoking status: Former    Current packs/day: 0.00    Average packs/day: 2.0 packs/day for 18.0 years (36.0 ttl pk-yrs)    Types: Cigarettes    Start date: 12/01/1962    Quit date: 12/01/1980    Years since quitting: 43.4   Smokeless tobacco: Never  Vaping Use   Vaping status: Never Used  Substance and Sexual Activity   Alcohol  use: No   Drug use: Yes    Types: Oxycodone , Morphine     Comment: takes methadone    Sexual activity:  Not Currently  Other Topics Concern   Not on file  Social History Narrative   Not on file   Social Drivers of Health   Financial Resource Strain: High Risk (01/04/2024)   Received from [facility]   Overall Financial Resource Strain (CARDIA)    Difficulty of Paying Living Expenses: Hard  Food Insecurity: No Food Insecurity (04/14/2024)   Hunger Vital Sign    Worried About Running Out of Food in the Last Year: Never true    Ran Out of Food in the Last Year: Never true  Transportation Needs: No Transportation Needs (04/14/2024)   PRAPARE - Administrator, Civil Service (Medical): No    Lack of Transportation (Non-Medical): No  Physical Activity: Inactive (08/17/2023)   Received from [facility]   Exercise Vital Sign    Days of Exercise per Week: 0 days    Minutes  of Exercise per Session: 0 min  Stress: No Stress Concern Present (08/17/2023)   Received from [facility] - Occupational Stress Questionnaire    Feeling of Stress : Only a little  Social Connections: Unknown (04/14/2024)   Social Connection and Isolation Panel [NHANES]    Frequency of Communication with Friends and Family: More than three times a week    Frequency of Social Gatherings with Friends and Family: More than three times a week    Attends Religious Services: More than 4 times per year    Active Member of [employer] or Organizations: No    Attends Engineer, structural: More than 4 times per year    Marital Status: Patient declined  Recent Concern: Social Connections - Socially Isolated (03/09/2024)   Social Connection and Isolation Panel [NHANES]    Frequency of Communication with Friends and Family: More than three times a week    Frequency of Social Gatherings with Friends and Family: More than three times a week    Attends Religious Services: Never    Database administrator or Organizations: No     Attends Banker Meetings: Never    Marital Status: Widowed  Intimate Partner Violence: Not At Risk (04/14/2024)   Humiliation, Afraid, Rape, and Kick questionnaire    Fear of Current or Ex-Partner: No    Emotionally Abused: No    Physically Abused: No    Sexually Abused: No     Family History  Problem Relation Age of Onset   CAD Father 73   Heart attack Father    COPD Sister    CAD Paternal Grandmother    Sudden Cardiac Death Neg Hx    Colon polyps Neg Hx    Colon cancer Neg Hx      Current Facility-Administered Medications:    (feeding supplement) PROSource Plus liquid 30 mL, 30 mL, Oral, TID BM, Sreenath, Sudheer B, MD, 30 mL at 04/20/24 1313   amLODipine  (NORVASC ) tablet 5 mg, 5 mg, Oral, Daily, Niu, Xilin, MD, 5 mg at 04/20/24 1610   apixaban  (ELIQUIS ) tablet 5 mg, 5 mg, Oral, BID, Wouk, Haynes Lips, MD   ascorbic acid  (VITAMIN C) tablet 500 mg, 500 mg, Oral, BID, Mosetta Areola, Sudheer B, MD, 500 mg at 04/20/24 9604   bisacodyl  (DULCOLAX) suppository 10 mg, 10 mg, Rectal, Daily PRN, Mosetta Areola, Sudheer B, MD   Chlorhexidine  Gluconate Cloth 2 % PADS 6 each, 6 each, Topical, Daily, Sreenath, Sudheer B, MD, 6 each at 04/20/24 1058   dextromethorphan -guaiFENesin  (MUCINEX  DM) 30-600 MG per 12 hr tablet 1 tablet, 1 tablet, Oral, BID PRN, Niu, Xilin, MD, 1 tablet at 04/17/24 2111   dextrose  50 % solution 50 mL, 1 ampule, Intravenous, PRN, Mosetta Areola, Sudheer B, MD, 50 mL at 04/20/24 0820   diclofenac  Sodium (VOLTAREN ) 1 % topical gel 4 g, 4 g, Topical, QID, Sreenath, Sudheer B, MD, 4 g at 04/20/24 1314   DULoxetine  (CYMBALTA ) DR capsule 30 mg, 30 mg, Oral, Daily, Niu, Xilin, MD, 30 mg at 04/20/24 5409   feeding supplement (BOOST / RESOURCE BREEZE) liquid 1 Container, 1 Container, Oral, TID BM, Sreenath, Sudheer B, MD, 1 Container at 04/20/24 1314   ferrous sulfate  tablet 325 mg, 325 mg, Oral, TID WC, Niu, Xilin, MD, 325 mg at 04/20/24 1642   guaiFENesin  (MUCINEX ) 12 hr tablet 600  mg, 600 mg, Oral, BID, Mosetta Areola, Sudheer B, MD, 600 mg at 04/20/24 [phone]  hydrALAZINE  (APRESOLINE ) injection 5 mg, 5 mg, Intravenous, Q2H PRN, Niu, Xilin, MD   HYDROmorphone  (DILAUDID ) tablet 2-4 mg, 2-4 mg, Oral, Q4H PRN, Mosetta Areola, Sudheer B, MD, 4 mg at 04/19/24 1744   ibuprofen  (ADVIL ) tablet 200 mg, 200 mg, Oral, Q6H PRN, Niu, Xilin, MD, 200 mg at 04/18/24 0553   ipratropium (ATROVENT ) nebulizer solution 0.5 mg, 0.5 mg, Nebulization, Q4H PRN, Ramonita Burow, RPH   losartan  (COZAAR ) tablet 100 mg, 100 mg, Oral, Daily, Niu, Xilin, MD, 100 mg at 04/20/24 8119   methocarbamol  (ROBAXIN ) tablet 500 mg, 500 mg, Oral, Q6H PRN, Niu, Xilin, MD, 500 mg at 04/18/24 1478   metoprolol  succinate (TOPROL -XL) 24 hr tablet 75 mg, 75 mg, Oral, Daily, Niu, Xilin, MD, 75 mg at 04/20/24 0820   multivitamin with minerals tablet 1 tablet, 1 tablet, Oral, Daily, Mosetta Areola, Sudheer B, MD, 1 tablet at 04/20/24 0820   mupirocin  ointment (BACTROBAN ) 2 % 1 Application, 1 Application, Nasal, BID, Mosetta Areola, Sudheer B, MD, 1 Application at 04/20/24 2956   nitroGLYCERIN  (NITROSTAT ) SL tablet 0.4 mg, 0.4 mg, Sublingual, Q5 min PRN, Niu, Xilin, MD   ondansetron  (ZOFRAN ) injection 4 mg, 4 mg, Intravenous, Q8H PRN, Niu, Xilin, MD, 4 mg at 04/19/24 1829   pantoprazole  (PROTONIX ) EC tablet 40 mg, 40 mg, Oral, BID, Niu, Xilin, MD, 40 mg at 04/20/24 2130   piperacillin -tazobactam (ZOSYN ) IVPB 3.375 g, 3.375 g, Intravenous, Q8H, Ramonita Burow, RPH, Last Rate: 12.5 mL/hr at 04/20/24 1312, 3.375 g at 04/20/24 1312   polyethylene glycol (MIRALAX  / GLYCOLAX ) packet 17 g, 17 g, Oral, Daily, Mosetta Areola, Sudheer B, MD, 17 g at 04/17/24 8657   polyvinyl alcohol  (LIQUIFILM TEARS) 1.4 % ophthalmic solution 1 drop, 1 drop, Both Eyes, TID PRN, Niu, Xilin, MD   senna-docusate (Senokot-S) tablet 1 tablet, 1 tablet, Oral, BID, Mosetta Areola, Sudheer B, MD, 1 tablet at 04/18/24 0858   zinc sulfate (50mg  elemental zinc) capsule 220 mg, 220 mg, Oral,  Daily, Mosetta Areola, Sudheer B, MD, 220 mg at 04/20/24 0821  Review of Systems - Oncology  PHYSICAL EXAM Vitals:   04/20/24 0500 04/20/24 0758 04/20/24 1208 04/20/24 1642  BP:  (!) 140/82 (!) 140/77 133/64  Pulse:  100  88  Resp:  16 16   Temp:  98.5 F (36.9 C) 98.1 F (36.7 C) 98.4 F (36.9 C)  TempSrc:  Axillary Oral   SpO2:  100% 100% 99%  Weight: 189 lb 6 oz (85.9 kg)     Height:       Physical Exam    LABORATORY STUDIES    Latest Ref Rng & Units 04/19/2024    4:12 AM 04/18/2024    1:33 PM 04/18/2024    5:16 AM  CBC  WBC 4.0 - 10.5 K/uL 9.0  10.0  10.3   Hemoglobin 12.0 - 15.0 g/dL 7.4  7.8  8.0   Hematocrit 36.0 - 46.0 % 23.5  24.2  25.3   Platelets 150 - 400 K/uL 117  94  102       Latest Ref Rng & Units 04/19/2024    5:20 PM 04/19/2024    4:12 AM 04/18/2024    1:33 PM  CMP  Glucose 70 - 99 mg/dL 846  962  952   BUN 8 - 23 mg/dL  12  17   Creatinine 8.41 - 1.00 mg/dL  3.24  4.01   Sodium 027 - 145 mmol/L  137  139   Potassium 3.5 - 5.1 mmol/L  3.4  3.3   Chloride 98 - 111 mmol/L  109  108   CO2 22 - 32 mmol/L  22  21   Calcium  8.9 - 10.3 mg/dL  6.6  6.8   Total Protein 6.5 - 8.1 g/dL  5.5  5.6   Total Bilirubin 0.0 - 1.2 mg/dL  4.2  5.0   Alkaline Phos 38 - 126 U/L  548  539   AST 15 - 41 U/L  98  105   ALT 0 - 44 U/L  46  47      RADIOGRAPHIC STUDIES: I have personally reviewed the radiological images as listed and agreed with the findings in the report. CT ABDOMEN PELVIS WO CONTRAST Result Date: 04/19/2024 CLINICAL DATA:  Abdominal pain, acute, nonlocalized EXAM: CT ABDOMEN AND PELVIS WITHOUT CONTRAST TECHNIQUE: Multidetector CT imaging of the abdomen and pelvis was performed following the standard protocol without IV contrast. RADIATION DOSE REDUCTION: This exam was performed according to the departmental dose-optimization program which includes automated exposure control, adjustment of the mA and/or kV according to patient size and/or use of iterative  reconstruction technique. COMPARISON:  March 08, 2024 FINDINGS: Of note, the lack of intravenous contrast limits evaluation of the solid organ parenchyma and vascularity. Lower chest: Small bilateral pleural effusions, slightly larger on the left than the right, with bibasilar atelectasis. Coronary artery atherosclerosis. Hepatobiliary: Multiple small hepatic hypodensities have developed in the interim. For example, in the posterior right hepatic dome there is a 1.7 cm hypodense lesion (axial 14). In the medial right hepatic lobe there is a 1.8 cm lesion (axial 19). Anterior left hepatic lobe lesion measuring 1.8 cm (axial 15). Cholecystectomy. Decompression of the intrahepatic biliary tree with diffuse pneumobilia. Well-positioned metallic biliary duct stent in the common bile duct. No radiopaque choledocholithiasis. Pancreas: No mass or main ductal dilation. No peripancreatic inflammation or fluid collection. Spleen: Normal size. No mass. Adrenals/Urinary Tract: No adrenal masses. Unchanged thick-walled cystic structure in the left kidney measuring 3.1 x 3 cm. No hydronephrosis or nephrolithiasis. The urinary bladder is completely decompressed. Stomach/Bowel: The stomach is decompressed without focal abnormality. No small bowel wall thickening or inflammation. No small bowel obstruction.Descending and sigmoid colonic diverticulosis. No changes of acute diverticulitis. Vascular/Lymphatic: No aortic aneurysm. Diffuse aortoiliac atherosclerosis. No intraabdominal or pelvic lymphadenopathy. Reproductive: Hysterectomy. No concerning adnexal mass. No free pelvic fluid. Other: No pneumoperitoneum. Small volume ascites predominantly in the left paracolic gutter. Musculoskeletal: No acute fracture or destructive lesion. Unchanged peripherally calcified mass in the lateral right breast. Diffuse osteopenia. Multilevel degenerative disc disease of the spine. Large sclerotic lesion within the L2 vertebral body, unchanged.  Severe pathologic compression fracture with retropulsion at L1 with retropulsion and moderate canal narrowing, unchanged. Multilevel degenerative disc disease of the spine. Diffuse anasarca. Infusion device in the right lower quadrant with a catheter extending into the thoracic spine, outside the field of view. IMPRESSION: 1. Interval development of multiple new hepatic hypodensities measuring up to 1.8 cm, worrisome for progressive metastatic disease. 2. Unchanged thick-walled left renal cystic lesion, consistent with renal cell carcinoma. 3. Small bilateral pleural effusions slightly larger on the right than the left. Small volume ascites, predominantly in the left paracolic gutter. Anasarca. This is likely related to the patient's volume status. 4. Unchanged bony metastases in L1 and L2 with severe pathologic compression fracture of L1 and moderate canal narrowing. Electronically Signed   By: Rance Burrows M.D.   On: 04/19/2024 21:13   DG C-Arm 1-60 Min-No Report Result Date: 04/15/2024  Fluoroscopy was utilized by the requesting physician.  No radiographic interpretation.   US  RENAL Result Date: 03/11/2024 CLINICAL DATA:  Acute kidney injury EXAM: RENAL / URINARY TRACT ULTRASOUND COMPLETE COMPARISON:  CT abdomen pelvis 03/08/2024 FINDINGS: Right Kidney: Renal measurements: 9.5 x 4.7 x 5.3 cm = volume: 124 mL. Mild increased echogenicity the renal cortex without significant thinning. No mass or hydronephrosis visualized. Left Kidney: Renal measurements: 9.9 x 5.2 x 4.5 cm = volume: 121 mL. Mild increased echogenicity of the renal cortex without significant thinning. Solid heterogeneous mass again seen in the upper pole measuring 3.1 x 2.2 x 2.7 cm. A simple cyst seen in the lower pole of the left kidney measuring 1.3 x 1.1 x 1.2 cm not require dedicated imaging follow-up. Bladder: Appears normal for degree of bladder distention. Other: None. IMPRESSION: 1. No hydronephrosis. 2. Increased echogenicity of  the renal cortex consistent with chronic medical renal disease. 3. Solid heterogeneous mass in the upper pole the left kidney again seen, consistent with the renal cell carcinoma. Electronically Signed   By: Elester Grim M.D.   On: 03/11/2024 12:23   DG C-Arm 1-60 Min-No Report Result Date: 03/09/2024 Fluoroscopy was utilized by the requesting physician.  No radiographic interpretation.   MR ABDOMEN MRCP W WO CONTAST Result Date: 03/09/2024 CLINICAL DATA:  Jaundice, metastatic breast cancer EXAM: MRI ABDOMEN WITHOUT AND WITH CONTRAST (INCLUDING MRCP) TECHNIQUE: Multiplanar multisequence MR imaging of the abdomen was performed both before and after the administration of intravenous contrast. Heavily T2-weighted images of the biliary and pancreatic ducts were obtained, and three-dimensional MRCP images were rendered by post processing. CONTRAST:  9mL GADAVIST  GADOBUTROL  1 MMOL/ML IV SOLN COMPARISON:  CT chest abdomen pelvis, 03/08/2024, 01/01/2024 FINDINGS: Examination is unfortunately very limited by breath motion artifact as well as susceptibility and saturation artifact arising from a subcutaneous pain pump in the soft tissues of the right upper quadrant. Lower chest: No acute abnormality. Calcified mass in the right breast (series 5, image 4). Hepatobiliary: No focal liver abnormality is seen. Status post cholecystectomy. Severe intra and extrahepatic biliary ductal dilatation with periportal edema. This appears to be increased when compared to prior contrast enhanced CT dated 01/01/2024. The central common bile duct is very difficult to assess due to artifact and image quality but appears to sharply narrow within the pancreatic head (series 15, image 49). Pancreas: Unremarkable. No pancreatic ductal dilatation or surrounding inflammatory changes. Spleen: Normal in size without significant abnormality. Adrenals/Urinary Tract: Adrenal glands are unremarkable. Heterogeneously enhancing mass in the peripheral  superior pole of the left kidney measuring 2.7 x 2.5 cm (series 15, image 37). Right kidney is normal, without obvious renal calculi, solid lesion, or hydronephrosis. Stomach/Bowel: Stomach is within normal limits. No evidence of bowel wall thickening, distention, or inflammatory changes. Vascular/Lymphatic: Severe aortic atherosclerosis. No enlarged abdominal lymph nodes. Other: No abdominal wall hernia or abnormality. No ascites. Musculoskeletal: No acute or significant osseous findings. IMPRESSION: 1. Examination is unfortunately very limited by breath motion artifact as well as susceptibility and saturation artifact arising from a subcutaneous pain pump in the soft tissues of the right upper quadrant. 2. Within this limitation, severe intra and extrahepatic biliary ductal dilatation with periportal edema. This appears to be increased when compared to prior contrast enhanced CT dated 01/01/2024. The central common bile duct is very difficult to assess due to artifact and image quality but appears to sharply narrow within the pancreatic head. Findings are highly concerning for obstructing calculus or mass; repeat contrast  enhanced CT may be helpful to better assess given severe technical limitations of this MR. 3. Heterogeneously enhancing mass in the peripheral superior pole of the left kidney measuring 2.7 x 2.5 cm, consistent with renal cell carcinoma. Aortic Atherosclerosis (ICD10-I70.0). Electronically Signed   By: Fredricka Jenny M.D.   On: 03/09/2024 07:46   MR 3D Recon At Scanner Result Date: 03/09/2024 CLINICAL DATA:  Jaundice, metastatic breast cancer EXAM: MRI ABDOMEN WITHOUT AND WITH CONTRAST (INCLUDING MRCP) TECHNIQUE: Multiplanar multisequence MR imaging of the abdomen was performed both before and after the administration of intravenous contrast. Heavily T2-weighted images of the biliary and pancreatic ducts were obtained, and three-dimensional MRCP images were rendered by post processing. CONTRAST:   9mL GADAVIST  GADOBUTROL  1 MMOL/ML IV SOLN COMPARISON:  CT chest abdomen pelvis, 03/08/2024, 01/01/2024 FINDINGS: Examination is unfortunately very limited by breath motion artifact as well as susceptibility and saturation artifact arising from a subcutaneous pain pump in the soft tissues of the right upper quadrant. Lower chest: No acute abnormality. Calcified mass in the right breast (series 5, image 4). Hepatobiliary: No focal liver abnormality is seen. Status post cholecystectomy. Severe intra and extrahepatic biliary ductal dilatation with periportal edema. This appears to be increased when compared to prior contrast enhanced CT dated 01/01/2024. The central common bile duct is very difficult to assess due to artifact and image quality but appears to sharply narrow within the pancreatic head (series 15, image 49). Pancreas: Unremarkable. No pancreatic ductal dilatation or surrounding inflammatory changes. Spleen: Normal in size without significant abnormality. Adrenals/Urinary Tract: Adrenal glands are unremarkable. Heterogeneously enhancing mass in the peripheral superior pole of the left kidney measuring 2.7 x 2.5 cm (series 15, image 37). Right kidney is normal, without obvious renal calculi, solid lesion, or hydronephrosis. Stomach/Bowel: Stomach is within normal limits. No evidence of bowel wall thickening, distention, or inflammatory changes. Vascular/Lymphatic: Severe aortic atherosclerosis. No enlarged abdominal lymph nodes. Other: No abdominal wall hernia or abnormality. No ascites. Musculoskeletal: No acute or significant osseous findings. IMPRESSION: 1. Examination is unfortunately very limited by breath motion artifact as well as susceptibility and saturation artifact arising from a subcutaneous pain pump in the soft tissues of the right upper quadrant. 2. Within this limitation, severe intra and extrahepatic biliary ductal dilatation with periportal edema. This appears to be increased when compared  to prior contrast enhanced CT dated 01/01/2024. The central common bile duct is very difficult to assess due to artifact and image quality but appears to sharply narrow within the pancreatic head. Findings are highly concerning for obstructing calculus or mass; repeat contrast enhanced CT may be helpful to better assess given severe technical limitations of this MR. 3. Heterogeneously enhancing mass in the peripheral superior pole of the left kidney measuring 2.7 x 2.5 cm, consistent with renal cell carcinoma. Aortic Atherosclerosis (ICD10-I70.0). Electronically Signed   By: Fredricka Jenny M.D.   On: 03/09/2024 07:46   CT CHEST ABDOMEN PELVIS WO CONTRAST Result Date: 03/08/2024 CLINICAL DATA:  History of metastatic breast cancer with weakness, body aches, and lethargy associated with nausea and vomiting. * Tracking Code: BO * EXAM: CT CHEST, ABDOMEN AND PELVIS WITHOUT CONTRAST TECHNIQUE: Multidetector CT imaging of the chest, abdomen and pelvis was performed following the standard protocol without IV contrast. RADIATION DOSE REDUCTION: This exam was performed according to the departmental dose-optimization program which includes automated exposure control, adjustment of the mA and/or kV according to patient size and/or use of iterative reconstruction technique. COMPARISON:  CT chest, abdomen, and  pelvis dated 01/01/2024 FINDINGS: CT CHEST FINDINGS Cardiovascular: Normal heart size. No significant pericardial fluid/thickening. Great vessels are normal in course and caliber. Coronary artery calcifications. Mediastinum/Nodes: Imaged thyroid  gland without nodules meeting criteria for imaging follow-up by size. Normal esophagus. No pathologically enlarged axillary, supraclavicular, mediastinal, or hilar lymph nodes. Lungs/Pleura: The central airways are patent. Adherent secretions within the trachea. Unchanged irregular right apical nodule measuring 7 x 6 mm (3:34). No new or enlarging pulmonary nodules. No  pneumothorax. No pleural effusion. Musculoskeletal: No acute or abnormal lytic or blastic osseous lesions. Multilevel degenerative changes of the thoracic spine. Unchanged calcified mass in the right breast. Punctate foci of gas within the right anterior chest appears anti dependent within small vessels in the right subclavian vein. CT ABDOMEN PELVIS FINDINGS Hepatobiliary: No focal hepatic lesions. Mild intrahepatic bile duct dilation, which may be related to cholecystectomy. Trace pneumobilia at the hepatic hilum. Pancreas: No focal lesions or main ductal dilation. Spleen: Normal in size without focal abnormality. Adrenals/Urinary Tract: No adrenal nodules. No calculi or hydronephrosis. Left upper pole renal mass, better evaluated on prior contrast-enhanced examination. No focal bladder wall thickening. Stomach/Bowel: Normal appearance of the stomach. No evidence of bowel wall thickening, distention, or inflammatory changes. Large volume stool within the rectum. Moderate volume stool within the descending and sigmoid colon. Appendix is not discretely seen. Vascular/Lymphatic: Aortic atherosclerosis. No enlarged abdominal or pelvic lymph nodes. Reproductive: No adnexal masses. Other: No free fluid, fluid collection, or free air. Calcified nodule along the left anterior peritoneum, likely sequela of prior infection/inflammation. Musculoskeletal: Similar sclerotic lesion at L1-2 with L1 compression fracture and retropulsion of fracture fragments. Unchanged cortical irregularity of left inferior pubic ramus. Multilevel degenerative changes of the lumbar spine. Again seen is infusion pump within the right anterior abdominal wall. IMPRESSION: 1. No acute abnormality in the chest, abdomen, or pelvis. 2. Unchanged calcified right breast mass and irregular right apical nodule. No new or enlarging pulmonary nodules. 3. Similar sclerotic lesion at L1-2 with L1 compression fracture and retropulsion of fracture fragments. 4.  Large volume stool within the rectum. Moderate volume stool within the descending and sigmoid colon. Recommend correlation with constipation. 5. Left upper pole renal mass is better evaluated on prior contrast-enhanced examination. 6. Aortic Atherosclerosis (ICD10-I70.0). Coronary artery calcifications. Assessment for potential risk factor modification, dietary therapy or pharmacologic therapy may be warranted, if clinically indicated. Electronically Signed   By: Limin  Xu M.D.   On: 03/08/2024 20:56   CT Head Wo Contrast Result Date: 03/08/2024 CLINICAL DATA:  Mental status change, unknown cause EXAM: CT HEAD WITHOUT CONTRAST TECHNIQUE: Contiguous axial images were obtained from the base of the skull through the vertex without intravenous contrast. RADIATION DOSE REDUCTION: This exam was performed according to the departmental dose-optimization program which includes automated exposure control, adjustment of the mA and/or kV according to patient size and/or use of iterative reconstruction technique. COMPARISON:  CT head 05/01/2021 FINDINGS: Brain: No evidence of large-territorial acute infarction. No parenchymal hemorrhage. No mass lesion. No extra-axial collection. No mass effect or midline shift. No hydrocephalus. Basilar cisterns are patent. Vascular: No hyperdense vessel. Atherosclerotic calcifications are present within the cavernous internal carotid arteries. Skull: No acute fracture or focal lesion. Sinuses/Orbits: Paranasal sinuses and mastoid air cells are clear. The orbits are unremarkable. Other: None. IMPRESSION: No acute intracranial abnormality. Electronically Signed   By: Morgane  Naveau M.D.   On: 03/08/2024 20:09   US  Abdomen Limited Result Date: 03/08/2024 CLINICAL DATA:  Jaundice EXAM: ULTRASOUND ABDOMEN  LIMITED RIGHT UPPER QUADRANT COMPARISON:  CT abdomen pelvis 03/08/2024, CT abdomen pelvis 01/01/2024 FINDINGS: Gallbladder: Status post cholecystectomy. Common bile duct: Diameter: Thickened  measuring up to 14 mm with associated echogenic avascular material within the common bile duct. Liver: No focal lesion identified. Within normal limits in parenchymal echogenicity. Portal vein is patent on color Doppler imaging with normal direction of blood flow towards the liver. Pneumobilia noted. Intrahepatic ducts measure at the upper limits of normal. Other: None. IMPRESSION: 1. Thickened common bile duct with echogenic debris within the lumen. Intrahepatic ducts measure at the upper limits of normal. Finding could be sludge versus less likely a mass (given no vasculature identified). When the patient is clinically stable and able to follow directions and hold their breath (preferably as an outpatient) further evaluation with dedicated abdominal MRI should be considered. 2. Status post cholecystectomy. Electronically Signed   By: Morgane  Naveau M.D.   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  Recent CT showed multiple new liver lesions, this was not reported on her recent CT done at [facility] 1 week ago.  Differential diagnosis includes metastatic disease, liver abscess. 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 s/p ERCP with CBD stent placement.  bilirubin is trending down. Cytology from brushing of main bile duct was negative for malignancy  I recommend liver biopsy to clarify tissue diagnosis.  Her metastatic breast cancer disease has been fairly stable over the past few years.  Liver lesions are new since prior CT done 1 week ago.  Raising suspicion of liver abscess.  She is at high risk due to recent obstructive jaundice. If she truly has metastatic disease to liver, given limitation of treatment options due to her poor performance status, I think comfort measures/hospice is a reasonable option.  If she desires treatment, I  will defer to [facility] oncology.  She will need to get molecular testing done to see if any targeted therapy is available. Patient agrees with the plan.  I called patient's son Baker Bon and updated above recommendation.   I discussed recommendation with hospitalist Dr. Sari Cunning. Thank you for allowing me to participate in the care of this patient.   Timmy Forbes, MD, PhD Hematology Oncology 04/20/2024

## 2024-04-20 NOTE — Progress Notes (Signed)
 PROGRESS NOTE    Stacey [name]  Stacey [name]  ZOX:[id number] DOB: 1945-12-22 DOA: 04/14/2024 PCP: [name], MD    Brief Narrative:  78 y.o. female with medical history significant of hypertension, COPD, CAD, DES placement, diastolic CHF, A-fib on Eliquis , depression with anxiety, CKD-3B, PSVT, chronic pain, left bundle blockade, spinal stenosis, breast cancer metastasized to bone, left renal carcinoma, acute cholangitis and obstructive jaundice (s/p of ERCP), who presented to [facility] due to nausea, vomiting, right upper quadrant abdominal pain. Pt was accepted for transferring from [facility] per Dr. Isom Marin of [facility] ED on 5/13, arrived today.     Patient was recently hospitalized to [facility] on 4/9 - 4/18 due to acute cholangitis and obstructive jaundice.  Patient is s/p of ERCP, found to have duodenal ulcer, duodenal and sphincter stenosis.  Stent was placed and the purulent discharge was removed.    Patient states that she has nausea, multiple episodes of nonbilious nonbloody vomiting and abdominal pain in the past several days.  No fever or chills.  Abdominal pain is located in the upper abdomen, constant, mild to moderate, aching, nonradiating.  No diarrhea.  No fever or chills.  She states she had some chest discomfort yesterday, which has resolved, currently no active chest pain.  She has mild dry cough, mild SOB due to COPD.  No symptoms of UTI.   CTA of abdomen/pelvis done in [facility] showed severe intra- and extrahepatic biliary ductal dilation.  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  Common bile duct diffuse mucosa hyperenhancement, nonspecific, but can be seen with ascending cholangitis.  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  Osseous metastases not significantly changed.   Liver function with ALP 895, AST 135, ALT 41, total bilirubin 7.1, direct bilirubin 6.09.  Renal function stable, WBC 7.2, INR 1.23.  Temperature normal,  initial blood pressure low with SBP in 80s, which improved to 109/63 after 30 cc/kg normal saline resuscitation.  Heart rate 100, RR 15, oxygen  saturation 99% on room air.  Chest x-ray negative for infiltration. Pt was given Zosyn  in ED.   Dr. Isom Marin of ED in [facility] consulted Dr. Bridgett Camps of GI who reached to Dr. Ole Berkeley of [facility] GI. Dr. Ole Berkeley agreed to see pt after arrival to [facility]. Pt is accepted to PCU as inpt.  5/16: ERCP today. 5/20: Patient remains weak and fatigued.  Persistent hypoglycemia due to poor oral intake.  RD engaged for comment today.  Assessment & Plan:   Principal Problem:   Ascending cholangitis Active Problems:   Biliary obstruction   Atrial fibrillation, chronic (HCC)   CAD (coronary artery disease)   HLD (hyperlipidemia)   Chronic diastolic CHF (congestive heart failure) (HCC)   Chronic kidney disease, stage 3b (HCC)   CAD in native artery   COPD (chronic obstructive pulmonary disease) (HCC)   HTN (hypertension)   Thrombocytopenia (HCC)   Anxiety and depression   Bipolar 2 disorder, major depressive episode (HCC)   Obesity, Class II, BMI 35-39.9  Ascending cholangitis and biliary obstruction:  Patient has WBC 23.6, no fever, clinically does not seem to have sepsis. GI consulted on admission. 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 Status post ERCP for ascending cholangitis.  Fully covered metal stent placed.  Bilirubin and LFTs downtrending.    Plan: Gi has signed off, per dr. Ole Berkeley this stent is permanent  Recurrent hypoglycemia Poor p.o. intake Hypoalbuminemia Suspected malnutrition Patient's oral intake has been negligent in  the setting of recent biliary stent placement.  This is likely in the setting of significant metastatic disease. Plan: RD consultation Poor candidate for feeding tube  Atrial fibrillation, chronic (HCC Histsory PE:  Eliquis  resumed 5/19, will    CAD (coronary artery disease): Patent with no chest pain Years since last  stent placed, on plavix  per cardiology. Will hold that   HLD (hyperlipidemia) Lipitor  hold   Chronic diastolic CHF (congestive heart failure) (HCC): 2D echo 03/25/21 showed EF of 50-55% with grade 1 diastolic dysfunction.  BNP is elevated 406, but patient does not have leg edema or JVD.  Does not seem to have CHF exacerbation. No indication for diuretics.  Continue to monitor volume status   Chronic kidney disease, stage 3b [facility]): Renal function stable.  Recent baseline creatinine 1.45 on 03/23/2024.  Her creatinine is 1.06, BUN 32. Creatinine appears at baseline.  Continue monitor.  Avoid nephrotoxins   COPD (chronic obstructive pulmonary disease) (HCC): Stable Continue as needed bronchodilators   HTN (hypertension) Continue home Norvasc , Cozaar , Lopressor    Thrombocytopenia (HCC):   Appears mild and chronic.  No bleeding noted. Monitor carefully while on anticoagulation and antiplatelet   Anxiety and depression and bipolar 2 disorder, major depressive episode (HCC) Continue home Cymbalta   Obesity, Class II, BMI 35-39.9: Body weight 87.5 kg, BMI 37.69 Encouraged weight loss and diet.  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  Resume outpatient monitoring on discharge  DVT prophylaxis:Eliquis  Code Status: DNR Family Communication: none at bedside Disposition Plan: Status is: Inpatient Remains inpatient appropriate because: Multiple acute issues as above   Level of care: Progressive  Consultants:  GI  Procedures:  ERCP  Antimicrobials: Zosyn    Subjective: Seen and examined.  Stable abdominal pain  Objective: Vitals:   04/19/24 2322 04/20/24 0500 04/20/24 0758 04/20/24 1208  BP: (!) 155/74  (!) 140/82 (!) 140/77  Pulse: 90  100   Resp: 20  16 16   Temp: 98.4 F (36.9 C)  98.5 F (36.9 C) 98.1 F (36.7 C)  TempSrc:   Axillary Oral  SpO2: 100%  100% 100%  Weight:  85.9 kg    Height:         Intake/Output Summary (Last 24 hours) at 04/20/2024 1353 Last data filed at 04/20/2024 0418 Gross per 24 hour  Intake 0 ml  Output 2000 ml  Net -2000 ml   Filed Weights   04/18/24 0600 04/19/24 0415 04/20/24 0500  Weight: 85.7 kg 85.1 kg 85.9 kg    Examination:  General exam: Appears frail, fatigued, chronically ill Respiratory system: Poor respiratory effort.  Lungs clear.  Room air Cardiovascular system: S1-S2, RRR, no murmurs, no pedal edema Gastrointestinal system: Obese, nondistended, RUQ TTP, normal bowel sounds Central nervous system: Alert lethargic.  Unable to assess orientation Extremities: Symmetric 5 x 5 power. Skin: bruising extremities Psychiatry: Judgement and insight appear impaired. Mood & affect flattened.     Data Reviewed: I have personally reviewed following labs and imaging studies  CBC: Recent Labs  Lab 04/14/24 1912 04/15/24 0444 04/16/24 0928 04/17/24 0302 04/17/24 1242 04/18/24 0516 04/18/24 1333 04/19/24 0412  WBC 23.6*   < > 15.6* 11.5* 9.1 10.3 10.0 9.0  NEUTROABS 22.2*  --  13.4*  --   --  7.7  --  6.6  HGB 9.8*   < > 8.5* 8.0* 7.7* 8.0* 7.8* 7.4*  HCT 30.7*   < > 25.8* 24.6* 23.7*  25.3* 24.2* 23.5*  MCV 96.5   < > 94.9 94.6 95.6 96.9 96.8 98.3  PLT 93*   < > 85* 80* 77* 102* 94* 117*   < > = values in this interval not displayed.   Basic Metabolic Panel: Recent Labs  Lab 04/15/24 0711 04/16/24 0626 04/16/24 0928 04/17/24 1242 04/18/24 1333 04/19/24 0412 04/19/24 1720  NA 139  --  136 140 139 137  --   K 3.9  --  3.3* 3.0* 3.3* 3.4*  --   CL 108  --  107 112* 108 109  --   CO2 18*  --  21* 20* 21* 22  --   GLUCOSE 87  --  124* 90 136* 117* 122*  BUN 30*  --  29* 24* 17 12  --   CREATININE 1.00  --  1.05* 0.95 0.84 0.74  --   CALCIUM  7.2*  --  6.8* 6.6* 6.8* 6.6*  --   MG  --  2.0  --   --   --   --   --    GFR: Estimated Creatinine Clearance: 57.4 mL/min (by C-G formula based on SCr of 0.74 mg/dL). Liver Function  Tests: Recent Labs  Lab 04/15/24 0711 04/16/24 0928 04/17/24 1242 04/18/24 1333 04/19/24 0412  AST 190* 163* 132* 105* 98*  ALT 64* 59* 51* 47* 46*  ALKPHOS 611* 588* 541* 539* 548*  BILITOT 8.4* 5.6* 5.0* 5.0* 4.2*  PROT 6.0* 6.0* 5.3* 5.6* 5.5*  ALBUMIN  1.9* 1.9* 1.7* 1.6* 1.5*   Recent Labs  Lab 04/16/24 0928  LIPASE 87*   No results for input(s): "AMMONIA" in the last 168 hours. Coagulation Profile: Recent Labs  Lab 04/14/24 1912  INR 1.2   Cardiac Enzymes: No results for input(s): "CKTOTAL", "CKMB", "CKMBINDEX", "TROPONINI" in the last 168 hours. BNP (last 3 results) No results for input(s): "PROBNP" in the last 8760 hours. HbA1C: No results for input(s): "HGBA1C" in the last 72 hours. CBG: Recent Labs  Lab 04/20/24 0414 04/20/24 0807 04/20/24 0810 04/20/24 0838 04/20/24 1108  GLUCAP 55* 42* 50* 122* 123*   Lipid Profile: No results for input(s): "CHOL", "HDL", "LDLCALC", "TRIG", "CHOLHDL", "LDLDIRECT" in the last 72 hours. Thyroid  Function Tests: No results for input(s): "TSH", "T4TOTAL", "FREET4", "T3FREE", "THYROIDAB" in the last 72 hours. Anemia Panel: No results for input(s): "VITAMINB12", "FOLATE", "FERRITIN", "TIBC", "IRON", "RETICCTPCT" in the last 72 hours. Sepsis Labs: No results for input(s): "PROCALCITON", "LATICACIDVEN" in the last 168 hours.  Recent Results (from the past 240 hours)  Culture, blood (Routine X 2) w Reflex to ID Panel     Status: None   Collection Time: 04/14/24  7:12 PM   Specimen: BLOOD  Result Value Ref Range Status   Specimen Description BLOOD BLOOD LEFT HAND  Final   Special Requests   Final    BOTTLES DRAWN AEROBIC AND ANAEROBIC Blood Culture adequate volume   Culture   Final    NO GROWTH 5 DAYS Performed at [facility], [address] [place], [place] [address]    Report Status 04/19/2024 FINAL  Final  Culture, blood (Routine X 2) w Reflex to ID Panel     Status: None   Collection Time: 04/14/24   7:13 PM   Specimen: BLOOD  Result Value Ref Range Status   Specimen Description BLOOD BLOOD LEFT ARM  Final   Special Requests   Final    BOTTLES DRAWN AEROBIC AND ANAEROBIC Blood Culture adequate volume  Culture   Final    NO GROWTH 5 DAYS Performed at [facility], [address] [place] [place], [place] [address]    Report Status 04/19/2024 FINAL  Final  MRSA Next Gen by PCR, Nasal     Status: Abnormal   Collection Time: 04/15/24  5:40 AM   Specimen: Nasal Mucosa; Nasal Swab  Result Value Ref Range Status   MRSA by PCR Next Gen DETECTED (A) NOT DETECTED Final    Comment: RESULT CALLED TO, READ BACK BY AND VERIFIED WITH: Diallo, Boubacar P, RN 04/15/24 0719 MW (NOTE) The GeneXpert MRSA Assay (FDA approved for NASAL specimens only), is one component of a comprehensive MRSA colonization surveillance program. It is not intended to diagnose MRSA infection nor to guide or monitor treatment for MRSA infections. Test performance is not FDA approved in patients less than 50 years old. Performed at [facility], [address] [place], [place] [address]          Radiology Studies: CT ABDOMEN PELVIS WO CONTRAST Result Date: 04/19/2024 CLINICAL DATA:  Abdominal pain, acute, nonlocalized EXAM: CT ABDOMEN AND PELVIS WITHOUT CONTRAST TECHNIQUE: Multidetector CT imaging of the abdomen and pelvis was performed following the standard protocol without IV contrast. RADIATION DOSE REDUCTION: This exam was performed according to the departmental dose-optimization program which includes automated exposure control, adjustment of the mA and/or kV according to patient size and/or use of iterative reconstruction technique. COMPARISON:  March 08, 2024 FINDINGS: Of note, the lack of intravenous contrast limits evaluation of the solid organ parenchyma and vascularity. Lower chest: Small bilateral pleural effusions, slightly larger on the left than the right, with bibasilar atelectasis. Coronary  artery atherosclerosis. Hepatobiliary: Multiple small hepatic hypodensities have developed in the interim. For example, in the posterior right hepatic dome there is a 1.7 cm hypodense lesion (axial 14). In the medial right hepatic lobe there is a 1.8 cm lesion (axial 19). Anterior left hepatic lobe lesion measuring 1.8 cm (axial 15). Cholecystectomy. Decompression of the intrahepatic biliary tree with diffuse pneumobilia. Well-positioned metallic biliary duct stent in the common bile duct. No radiopaque choledocholithiasis. Pancreas: No mass or main ductal dilation. No peripancreatic inflammation or fluid collection. Spleen: Normal size. No mass. Adrenals/Urinary Tract: No adrenal masses. Unchanged thick-walled cystic structure in the left kidney measuring 3.1 x 3 cm. No hydronephrosis or nephrolithiasis. The urinary bladder is completely decompressed. Stomach/Bowel: The stomach is decompressed without focal abnormality. No small bowel wall thickening or inflammation. No small bowel obstruction.Descending and sigmoid colonic diverticulosis. No changes of acute diverticulitis. Vascular/Lymphatic: No aortic aneurysm. Diffuse aortoiliac atherosclerosis. No intraabdominal or pelvic lymphadenopathy. Reproductive: Hysterectomy. No concerning adnexal mass. No free pelvic fluid. Other: No pneumoperitoneum. Small volume ascites predominantly in the left paracolic gutter. Musculoskeletal: No acute fracture or destructive lesion. Unchanged peripherally calcified mass in the lateral right breast. Diffuse osteopenia. Multilevel degenerative disc disease of the spine. Large sclerotic lesion within the L2 vertebral body, unchanged. Severe pathologic compression fracture with retropulsion at L1 with retropulsion and moderate canal narrowing, unchanged. Multilevel degenerative disc disease of the spine. Diffuse anasarca. Infusion device in the right lower quadrant with a catheter extending into the thoracic spine, outside the field  of view. IMPRESSION: 1. Interval development of multiple new hepatic hypodensities measuring up to 1.8 cm, worrisome for progressive metastatic disease. 2. Unchanged thick-walled left renal cystic lesion, consistent with renal cell carcinoma. 3. Small bilateral pleural effusions slightly larger on the right than the left. Small volume ascites, predominantly in the  left paracolic gutter. Anasarca. This is likely related to the patient's volume status. 4. Unchanged bony metastases in L1 and L2 with severe pathologic compression fracture of L1 and moderate canal narrowing. Electronically Signed   By: Rance Burrows M.D.   On: 04/19/2024 21:13         Scheduled Meds:  (feeding supplement) PROSource Plus  30 mL Oral TID BM   amLODipine   5 mg Oral Daily   apixaban   2.5 mg Oral BID   vitamin C  500 mg Oral BID   Chlorhexidine  Gluconate Cloth  6 each Topical Daily   clopidogrel   75 mg Oral Daily   diclofenac  Sodium  4 g Topical QID   DULoxetine   30 mg Oral Daily   feeding supplement  1 Container Oral TID BM   ferrous sulfate   325 mg Oral TID WC   guaiFENesin   600 mg Oral BID   losartan   100 mg Oral Daily   metoprolol  succinate  75 mg Oral Daily   multivitamin with minerals  1 tablet Oral Daily   mupirocin  ointment  1 Application Nasal BID   pantoprazole   40 mg Oral BID   polyethylene glycol  17 g Oral Daily   senna-docusate  1 tablet Oral BID   zinc sulfate (50mg  elemental zinc)  220 mg Oral Daily   Continuous Infusions:  dextrose  5 % and 0.45 % NaCl with KCl 20 mEq/L 100 mL/hr at 04/20/24 1311   piperacillin -tazobactam (ZOSYN )  IV 3.375 g (04/20/24 1312)     LOS: 6 days     Raymonde Calico, MD [facility]   If 7PM-7AM, please contact night-coverage  04/20/2024, 1:53 PM

## 2024-04-20 NOTE — Progress Notes (Signed)
 PHARMACY - ANTICOAGULATION CONSULT NOTE  Pharmacy Consult for heparin  Indication: atrial fibrillation  Allergies  Allergen Reactions   Brilinta  [Ticagrelor ] Diarrhea and Nausea Only    Nausea and severe diarrhea- general weakness. Patient says does not want to take again   Budesonide  Palpitations   Albuterol  Other (See Comments)    Heart racing     Patient Measurements: Height: 5' (152.4 cm) Weight: 85.9 kg (189 lb 6 oz) IBW/kg (Calculated) : 45.5 HEPARIN  DW (KG): 66.1  Vital Signs: Temp: 98.4 F (36.9 C) (05/21 1642) Temp Source: Oral (05/21 1208) BP: 133/64 (05/21 1642) Pulse Rate: 88 (05/21 1642)  Labs: Recent Labs    04/18/24 0516 04/18/24 0517 04/18/24 1333 04/19/24 0412  HGB 8.0*  --  7.8* 7.4*  HCT 25.3*  --  24.2* 23.5*  PLT 102*  --  94* 117*  HEPARINUNFRC  --  0.35  --   --   CREATININE  --   --  0.84 0.74    Estimated Creatinine Clearance: 57.4 mL/min (by C-G formula based on SCr of 0.74 mg/dL).   Medical History: Past Medical History:  Diagnosis Date   CAD in native artery    a. DES to ramus 2005 with late stent thrombosis 2006 tx with PTCA. 12/18 PCI/DES x1 to mRCA, EF 50-55%   CHF (congestive heart failure) (HCC)    Chronic pain    COPD (chronic obstructive pulmonary disease) (HCC)    Hyperlipidemia    Hypertension    Left bundle branch block    Lymphedema    Metastatic breast cancer    a. to bone.   MI (myocardial infarction) (HCC)    Mild aortic stenosis 10/2017   Morbid obesity (HCC)    PAF (paroxysmal atrial fibrillation) (HCC)    PSVT (paroxysmal supraventricular tachycardia) (HCC)    a. per [facility] notes, seen on event monitor in 2014.   Pulmonary nodules    Assessment: 78 y/o female presenting with right upper quadrant abdominal pain with nausea and vomiting. PMH significant for hypertension, COPD, CAD, DES placement, diastolic CHF, A-fib on Eliquis , depression with anxiety, CKD-3B, PSVT, chronic pain, breast cancer metastasized to  bone, left renal carcinoma, acute cholangitis and obstructive jaundice (s/p of ERCP). Pharmacy has been consulted to transition to heparin  infusion.  Last dose of apixaban : 2.5 mg on 5/21 at 0821 Baseline labs: hgb 7.4, plt 117  Goal of Therapy:  Heparin  level 0.3-0.7 units/ml aPTT 66-102 seconds Monitor platelets by anticoagulation protocol: Yes   Plan:  Start heparin  infusion at 950 units tonight at 2030 without bolus, given recent use of apixaban  Check aPTT level in 8 hours Monitor aPTT levels until correlating with heparin  levels Monitor CBC and signs/symptoms of bleeding  Thank you for involving pharmacy in this patient's care.   Ananias Balls, PharmD Clinical Pharmacist 04/20/2024 6:44 PM

## 2024-04-20 NOTE — Plan of Care (Signed)

## 2024-04-20 NOTE — Consult Note (Cosign Needed Addendum)
 Consultation Note Date: 04/20/2024 at 1330  Patient Name: Stacey  ANDELYN [name]  DOB: 15-Nov-1946  MRN: [id number]  Age / Sex: 78 y.o., female  PCP: [name], MD Referring Physician: Janeane Mealy, MD  HPI/Patient Profile: 78 y.o. female  with past medical history of hypertension, COPD, CAD, DES placement, diastolic CHF, A-fib on Eliquis , depression with anxiety, CKD-3B, PSVT, chronic pain, left bundle blockade, spinal stenosis, breast cancer metastasized to bone, left renal carcinoma, acute cholangitis and obstructive jaundice (s/p of ERCP), and recent hospitalization at [facility] for acute cholangitis and obstructive jaundice admitted transfer from [facility] on 04/14/2024 with right upper quadrant abdominal pain with nausea and vomiting.  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  Stent was placed and purulent discharge was removed.  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  Additionally, common bile duct diffuse mucosal hyperenhancement that was nonspecific but seen with ascending cholangitis.  Left renal presumed renal cell carcinoma with slightly increased in size and osseous metastases was not significantly changed.  5/20, CT of abdomen and pelvis without contract revealed: 1. Interval development of multiple new hepatic hypodensities measuring up to 1.8 cm, worrisome for progressive metastatic disease. 2. Unchanged thick-walled left renal cystic lesion, consistent with renal cell carcinoma. 3. Small bilateral pleural effusions slightly larger on the right than the left. Small volume ascites, predominantly in the left paracolic gutter. Anasarca. This is likely related to the patient's volume  status. 4. Unchanged bony metastases in L1 and L2 with severe pathologic compression fracture of L1 and moderate canal narrowing.  PMT was consulted to support patient and family with goals of care discussions.  Clinical Assessment and Goals of Care: Extensive chart review completed prior to meeting patient including labs, vital signs, imaging, progress notes, orders, and available advanced directive documents from current and previous encounters. I then met with an at bedside to discuss diagnosis prognosis, GOC, EOL wishes, disposition and options.  During my discussion, patient was alert and oriented x 4.  She appears capable of having goals of care and medical decision making independently at this time.    I introduced Palliative Medicine as specialized medical care for people living with serious illness. It focuses on providing relief from the symptoms and stress of a serious illness. The goal is to improve quality of life for both the patient and the family.  We discussed a brief life review of the patient.  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  She shares she enjoyed the job.  As far as functional and nutritional status patient has been bedbound for almost 3 years.  She shares that she does not want to eat or drink anything as it upsets her stomach.  When pressed for further details, patient shares that  anytime she swallows anything she feels as though she is going to throw up.  She feels bloated, crampy pain in her stomach, and does not want to eat or drink anything given this chronic issue.  Discussed that hydration and caloric intake is necessary/compatible with life.  She shares understanding.  Discussed that if patient is not taking an adequate hydration and nutrition by mouth, alternative/artificial means can be considered, such as a feeding tube.  As soon as I said feeding tube,  patient shared "oh no, absolutely not".  I attempted to discuss pros and cons of artificial hydration and nutrition.  Patient again stated "no I do not want that".  We discussed patient's current illness and what it means in the larger context of patient's on-going co-morbidities.  Patient was able to recall that she has breast cancer, renal cell cancer, and understands that it has now spread to her liver.  Natural disease trajectory and expectations at EOL were discussed.  I attempted to elicit values and goals of care important to the patient.  Patient states she is tired-tired of being in the hospital, tired of being tired, and tired of being in pain.  The difference between aggressive medical intervention and aging in place was considered in light of the patient's goals of care.  States her overall goal is that she be able to return home, have her pain well-controlled and her symptoms kept at bay, and for her to not have to return to the hospital.  When asked if she has shared these thoughts with her family, she says she has not.  She request that I speak with her son Baker Bon.  She also endorses that her son and daughter plan to be bedside late tomorrow and Friday.  She wishes to have a discussion with them present to hear her wishes and goals.  Permission to speak with Baker Bon today.  After visiting with the patient, I spoke with Baker Bon over the phone.  Baker Bon shares that this is good news that she is more alert and interactive as she has not been in these past few days.  He shares he has not had a chance to speak with her but looks forward to seeing and talking with her tomorrow.  I shared patient's wishes and goals, outlining patient had stated she is tired and wishes to return home and not have to be readmitted to the hospital.  Baker Bon has a very different outlook on wishes for patient.  He shares that he does not want her to give up.  He is hopeful that she can continue with cancer treatments, recover from  his hospitalization, and hopefully be able to return home and continue with PT/OT to transfer to the wheelchair.  I cautiously and gently conveyed that patient's current functional and nutritional status are poor. Reviewed that while stable her disease is metastatic to several organs with new hypodensities in her liver.  He shares that he believes she can continue to get treatments and as long as her kidney and liver are working then she has "got a chance".  Therapeutic silence, active listening, and emotional support provided.  Discussed with patient and Baker Bon the importance of continued conversation with the medical providers regarding overall plan of care and treatment options, ensuring decisions are within the context of the patient's values and goals.  Agreeing with versus supporting patient's wishes reviewed.  Ongoing discussion and clarification of patient's wishes to continue.  Baker Bon was in agreement for PMT to meet with he  and his sister when they are bedside, either late tomorrow (Thursday) or Friday.  No adjustment to plan of care at this time.  PMT will continue to follow and support patient and family throughout her hospitalization  Primary Decision Maker PATIENT  Physical Exam Constitutional:      Appearance: She is normal weight.  HENT:     Head: Normocephalic.     Nose: Nose normal.     Mouth/Throat:     Mouth: Mucous membranes are moist.  Eyes:     Pupils: Pupils are equal, round, and reactive to light.  Cardiovascular:     Rate and Rhythm: Normal rate.     Pulses: Normal pulses.  Pulmonary:     Effort: Pulmonary effort is normal.  Skin:    Coloration: Skin is jaundiced.  Neurological:     Mental Status: She is alert and oriented to person, place, and time.  Psychiatric:        Mood and Affect: Mood normal.        Behavior: Behavior normal.        Judgment: Judgment normal.     Palliative Assessment/Data:     Thank you for this consult. Palliative medicine  will continue to follow and assist holistically.   Time Total: 75 minutes  Time spent includes: Detailed review of medical records (labs, imaging, vital signs), medically appropriate exam (mental status, respiratory, cardiac, skin), discussed with treatment team, counseling and educating patient, family and staff, documenting clinical information, medication management and coordination of care.  Signed by: Eller Gut, DNP, FNP-BC Palliative Medicine   Please contact Palliative Medicine Team providers via [facility] for questions and concerns.

## 2024-04-20 NOTE — TOC Progression Note (Signed)
 Transition of Care [facility]) - Progression Note    Patient Details  Name: TAREKA JHAVERI MRN: [id number] Date of Birth: 1946/01/12  Transition of Care [facility] CM/SW Contact  Baird Bombard, RN Phone Number: 04/20/2024, 12:33 PM  Clinical Narrative:    Per Bartholomew Light at [facility] patient is active for RN. She was advised patient will be evaluated by PT/OT.         Expected Discharge Plan and Services                                               Social Determinants of Health (SDOH) Interventions SDOH Screenings   Food Insecurity: No Food Insecurity (04/14/2024)  Housing: Low Risk  (04/14/2024)  Transportation Needs: No Transportation Needs (04/14/2024)  Utilities: Not At Risk (04/14/2024)  Financial Resource Strain: High Risk (01/04/2024)   Received from [facility]  Physical Activity: Inactive (08/17/2023)   Received from [facility]  Social Connections: Unknown (04/14/2024)  Recent Concern: Social Connections - Socially Isolated (03/09/2024)  Stress: No Stress Concern Present (08/17/2023)   Received from [facility]  Tobacco Use: Medium Risk (04/15/2024)  Health Literacy: Inadequate Health Literacy (08/17/2023)   Received from [facility]    Readmission Risk Interventions    03/09/2024    8:15 AM  Readmission Risk Prevention Plan  Transportation Screening Complete  HRI or Home Care Consult Complete  Social Work Consult for Recovery Care Planning/Counseling Complete  Palliative Care Screening Complete  Medication Review Oceanographer) Complete

## 2024-04-20 NOTE — Consult Note (Signed)
 NAME: Stacey [name]  DOB: 1946-03-30  MRN: [id number]  Date/Time: 04/20/2024 3:51 PM  REQUESTING PROVIDER: Dr.Wouk Subjective:  REASON FOR CONSULT: ascending cholangitis ? Stacey  B [name] is a 78 y.o. with a history of  HTN, CAD s/p Stent ,Afib on eliquis , CKD,  Metastatic breast cancer to the bones, intrathecal pump ,  soft tissue extension at L2, thecal sac compression due to expansile pathological fracture at L1 causing weakness legs, PE, left renal mass  followed at [facility] for breast cancer and on Fulvestrant  and xgeva , hospitalized 4/9-4/18 at [facility] for obstructive jaundice with MRCP showing severe intrahepatic and extrahepatic biliary dilatation with sharp narrowing of the CBD in pancreatic head and she underwent ERCP which revealed acquired duodenal stenosis, oozing duodenal ulcers and CBD was successfully dilated and  plastic stent placement by Dr.Wohl on 03/09/24 with plan to exchange stent in 3 months was admitted to [facility] on 04/14/24 with nausea, vomiting and abdominal pain of several days She did not have fever or chills  04/14/24 17:59  BP 104/79  Temp 97.5 F (36.4 C) !  Pulse Rate 98  Resp 20  SpO2 100 %  O2 Flow Rate (L/min) 2 L/min  Weight 193 lb  Height 5' (1.524 m)    Latest Reference Range & Units 04/14/24 19:12  WBC 4.0 - 10.5 K/uL 23.6 (H)  Hemoglobin 12.0 - 15.0 g/dL 9.8 (L)  HCT 08.6 - 57.8 % 30.7 (L)  Platelets 150 - 400 K/uL 93 (L)  Creatinine 0.44 - 1.00 mg/dL 4.69 (H)   Pt was started on zosyn  after blood culture sent which was negative  Arecent CT abdomen done in [facility] -R showed   Severe intra- and extrahepatic biliary ductal dilation. 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  2.    Common bile duct diffuse mucosal hyperenhancement, non-specific but can be seen with ascending cholangitis.   3.    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   4.    Osseous metastases not significantly  changed.   Pt underwent on 04/15/24 ERCP and stent was changed to a metallic stent  CT abdomen done on 5/20 shows interval development of small hepatic hypodensities . Decompression of the biliary tree with diffuse pneumobilia  I am asked to see the patient for management of ascending cholangitis  Past Medical History:  Diagnosis Date   CAD in native artery    a. DES to ramus 2005 with late stent thrombosis 2006 tx with PTCA. 12/18 PCI/DES x1 to mRCA, EF 50-55%   CHF (congestive heart failure) (HCC)    Chronic pain    COPD (chronic obstructive pulmonary disease) (HCC)    Hyperlipidemia    Hypertension    Left bundle branch block    Lymphedema    Metastatic breast cancer    a. to bone.   MI (myocardial infarction) (HCC)    Mild aortic stenosis 10/2017   Morbid obesity (HCC)    PAF (paroxysmal atrial fibrillation) (HCC)    PSVT (paroxysmal supraventricular tachycardia) (HCC)    a. per [facility] notes, seen on event monitor in 2014.   Pulmonary nodules     Past Surgical History:  Procedure Laterality Date   BILIARY STENT PLACEMENT  03/09/2024   Procedure: INSERTION, STENT, BILE DUCT;  Surgeon: Marnee Sink, MD;  Location: [facility] ENDOSCOPY;  Service: Endoscopy;;   CHOLECYSTECTOMY N/A 03/29/2021   Procedure: LAPAROSCOPIC CHOLECYSTECTOMY;  Surgeon: Awilda Bogus, MD;  Location: [facility]  ORS;  Service: General;  Laterality: N/A;   CORONARY STENT INTERVENTION N/A 11/26/2017   Procedure: CORONARY STENT INTERVENTION;  Surgeon: Swaziland, Peter M, MD;  Location: [facility] INVASIVE CV LAB;  Service: Cardiovascular;  Laterality: N/A;   CORONARY STENT PLACEMENT     ERCP N/A 03/28/2021   Procedure: ENDOSCOPIC RETROGRADE CHOLANGIOPANCREATOGRAPHY (ERCP);  Surgeon: Ruby Corporal, MD;  Location: [facility] ORS;  Service: Endoscopy;  Laterality: N/A;   ERCP N/A 03/09/2024   Procedure: ERCP, WITH INTERVENTION IF INDICATED;  Surgeon: Marnee Sink, MD;  Location: [facility] ENDOSCOPY;  Service: Endoscopy;  Laterality: N/A;   ERCP  N/A 04/15/2024   Procedure: ERCP, WITH INTERVENTION IF INDICATED;  Surgeon: Marnee Sink, MD;  Location: [facility] ENDOSCOPY;  Service: Endoscopy;  Laterality: N/A;   ESOPHAGOGASTRODUODENOSCOPY  03/09/2024   Procedure: EGD (ESOPHAGOGASTRODUODENOSCOPY);  Surgeon: Marnee Sink, MD;  Location: [facility] ENDOSCOPY;  Service: Endoscopy;;   intrathecal pain pump     LEFT HEART CATH AND CORONARY ANGIOGRAPHY N/A 11/26/2017   Procedure: LEFT HEART CATH AND CORONARY ANGIOGRAPHY;  Surgeon: Swaziland, Peter M, MD;  Location: [facility] INVASIVE CV LAB;  Service: Cardiovascular;  Laterality: N/A;   REMOVAL OF STONES  03/28/2021   Procedure: REMOVAL OF STONES;  Surgeon: Ruby Corporal, MD;  Location: [facility] ORS;  Service: Endoscopy;;   SPHINCTEROTOMY  03/28/2021   Procedure: SPHINCTEROTOMY;  Surgeon: Ruby Corporal, MD;  Location: [facility] ORS;  Service: Endoscopy;;   UMBILICAL HERNIA REPAIR N/A 04/03/2021   Procedure: ADULT PRIMARY UMBILICAL HERNIA REPAIR;  Surgeon: Awilda Bogus, MD;  Location: [facility] ORS;  Service: General;  Laterality: N/A;   VASCULAR SURGERY      Social History   Socioeconomic History   Marital status: Single    Spouse name: Not on file   Number of children: Not on file   Years of education: Not on file   Highest education level: Not on file  Occupational History   Occupation: Retired  Tobacco Use   Smoking status: Former    Current packs/day: 0.00    Average packs/day: 2.0 packs/day for 18.0 years (36.0 ttl pk-yrs)    Types: Cigarettes    Start date: 12/01/1962    Quit date: 12/01/1980    Years since quitting: 43.4   Smokeless tobacco: Never  Vaping Use   Vaping status: Never Used  Substance and Sexual Activity   Alcohol  use: No   Drug use: Yes    Types: Oxycodone , Morphine     Comment: takes methadone    Sexual activity: Not Currently  Other Topics Concern   Not on file  Social History Narrative   Not on file   Social Drivers of Health   Financial Resource Strain: High Risk (01/04/2024)    Received [facility]   Overall Financial Resource Strain (CARDIA)    Difficulty of Paying Living Expenses: Hard  Food Insecurity: No Food Insecurity (04/14/2024)   Hunger Vital Sign    Worried About Running Out of Food in the Last Year: Never true    Ran Out of Food in the Last Year: Never true  Transportation Needs: No Transportation Needs (04/14/2024)   PRAPARE - Administrator, Civil Service (Medical): No    Lack of Transportation (Non-Medical): No  Physical Activity: Inactive (08/17/2023)   Received from [facility]   Exercise Vital Sign    Days of Exercise per Week: 0 days    Minutes of Exercise per Session: 0 min  Stress: No Stress Concern  Present (08/17/2023)   Received from [facility] of Occupational Health - Occupational Stress Questionnaire    Feeling of Stress : Only a little  Social Connections: Unknown (04/14/2024)   Social Connection and Isolation Panel [NHANES]    Frequency of Communication with Friends and Family: More than three times a week    Frequency of Social Gatherings with Friends and Family: More than three times a week    Attends Religious Services: More than 4 times per year    Active Member of [employer] or Organizations: No    Attends Engineer, structural: More than 4 times per year    Marital Status: Patient declined  Recent Concern: Social Connections - Socially Isolated (03/09/2024)   Social Connection and Isolation Panel [NHANES]    Frequency of Communication with Friends and Family: More than three times a week    Frequency of Social Gatherings with Friends and Family: More than three times a week    Attends Religious Services: Never    Database administrator or Organizations: No    Attends Banker Meetings: Never    Marital Status: Widowed  Intimate Partner Violence: Not At Risk (04/14/2024)   Humiliation, Afraid, Rape, and Kick questionnaire     Fear of Current or Ex-Partner: No    Emotionally Abused: No    Physically Abused: No    Sexually Abused: No    Family History  Problem Relation Age of Onset   CAD Father 40   Heart attack Father    COPD Sister    CAD Paternal Grandmother    Sudden Cardiac Death Neg Hx    Colon polyps Neg Hx    Colon cancer Neg Hx    Allergies  Allergen Reactions   Brilinta  [Ticagrelor ] Diarrhea and Nausea Only    Nausea and severe diarrhea- general weakness. Patient says does not want to take again   Budesonide  Palpitations   Albuterol  Other (See Comments)    Heart racing    I? Current Facility-Administered Medications  Medication Dose Route Frequency Provider Last Rate Last Admin   (feeding supplement) PROSource Plus liquid 30 mL  30 mL Oral TID BM Sreenath, Sudheer B, MD   30 mL at 04/20/24 1313   amLODipine  (NORVASC ) tablet 5 mg  5 mg Oral Daily Niu, Xilin, MD   5 mg at 04/20/24 0821   apixaban  (ELIQUIS ) tablet 5 mg  5 mg Oral BID Wouk, Haynes Lips, MD       ascorbic acid  (VITAMIN C) tablet 500 mg  500 mg Oral BID Sreenath, Sudheer B, MD   500 mg at 04/20/24 1610   bisacodyl  (DULCOLAX) suppository 10 mg  10 mg Rectal Daily PRN Margery Sheets B, MD       Chlorhexidine  Gluconate Cloth 2 % PADS 6 each  6 each Topical Daily Sreenath, Sudheer B, MD   6 each at 04/20/24 1058   dextromethorphan -guaiFENesin  (MUCINEX  DM) 30-600 MG per 12 hr tablet 1 tablet  1 tablet Oral BID PRN Niu, Xilin, MD   1 tablet at 04/17/24 2111   dextrose  50 % solution 50 mL  1 ampule Intravenous PRN Sreenath, Sudheer B, MD   50 mL at 04/20/24 0820   diclofenac  Sodium (VOLTAREN ) 1 % topical gel 4 g  4 g Topical QID Sreenath, Sudheer B, MD   4 g at 04/20/24 1314   DULoxetine  (CYMBALTA ) DR capsule 30 mg  30 mg Oral Daily Niu, Xilin, MD  30 mg at 04/20/24 1610   feeding supplement (BOOST / RESOURCE BREEZE) liquid 1 Container  1 Container Oral TID BM Sreenath, Sudheer B, MD   1 Container at 04/20/24 1314   ferrous sulfate   tablet 325 mg  325 mg Oral TID WC Niu, Xilin, MD   325 mg at 04/20/24 1311   guaiFENesin  (MUCINEX ) 12 hr tablet 600 mg  600 mg Oral BID Sreenath, Sudheer B, MD   600 mg at 04/20/24 9604   hydrALAZINE  (APRESOLINE ) injection 5 mg  5 mg Intravenous Q2H PRN Niu, Xilin, MD       HYDROmorphone  (DILAUDID ) tablet 2-4 mg  2-4 mg Oral Q4H PRN Sreenath, Sudheer B, MD   4 mg at 04/19/24 1744   ibuprofen  (ADVIL ) tablet 200 mg  200 mg Oral Q6H PRN Niu, Xilin, MD   200 mg at 04/18/24 0553   ipratropium (ATROVENT ) nebulizer solution 0.5 mg  0.5 mg Nebulization Q4H PRN Ramonita Burow, RPH       losartan  (COZAAR ) tablet 100 mg  100 mg Oral Daily Niu, Xilin, MD   100 mg at 04/20/24 5409   methocarbamol  (ROBAXIN ) tablet 500 mg  500 mg Oral Q6H PRN Niu, Xilin, MD   500 mg at 04/18/24 8119   metoprolol  succinate (TOPROL -XL) 24 hr tablet 75 mg  75 mg Oral Daily Niu, Xilin, MD   75 mg at 04/20/24 0820   multivitamin with minerals tablet 1 tablet  1 tablet Oral Daily Sreenath, Sudheer B, MD   1 tablet at 04/20/24 0820   mupirocin  ointment (BACTROBAN ) 2 % 1 Application  1 Application Nasal BID Sreenath, Sudheer B, MD   1 Application at 04/20/24 1478   nitroGLYCERIN  (NITROSTAT ) SL tablet 0.4 mg  0.4 mg Sublingual Q5 min PRN Niu, Xilin, MD       ondansetron  (ZOFRAN ) injection 4 mg  4 mg Intravenous Q8H PRN Niu, Xilin, MD   4 mg at 04/19/24 1829   pantoprazole  (PROTONIX ) EC tablet 40 mg  40 mg Oral BID Niu, Xilin, MD   40 mg at 04/20/24 2956   piperacillin -tazobactam (ZOSYN ) IVPB 3.375 g  3.375 g Intravenous Q8H Ramonita Burow, RPH 12.5 mL/hr at 04/20/24 1312 3.375 g at 04/20/24 1312   polyethylene glycol (MIRALAX  / GLYCOLAX ) packet 17 g  17 g Oral Daily Sreenath, Sudheer B, MD   17 g at 04/17/24 2130   polyvinyl alcohol  (LIQUIFILM TEARS) 1.4 % ophthalmic solution 1 drop  1 drop Both Eyes TID PRN Niu, Xilin, MD       senna-docusate (Senokot-S) tablet 1 tablet  1 tablet Oral BID Sreenath, Sudheer B, MD   1 tablet at  04/18/24 0858   zinc sulfate (50mg  elemental zinc) capsule 220 mg  220 mg Oral Daily Sreenath, Sudheer B, MD   220 mg at 04/20/24 8657     Abtx:  Anti-infectives (From admission, onward)    Start     Dose/Rate Route Frequency Ordered Stop   04/15/24 0430  piperacillin -tazobactam (ZOSYN ) IVPB 3.375 g        3.375 g 12.5 mL/hr over 240 Minutes Intravenous Every 8 hours 04/14/24 2013     04/14/24 2030  piperacillin -tazobactam (ZOSYN ) IVPB 3.375 g        3.375 g 12.5 mL/hr over 240 Minutes Intravenous  Once 04/14/24 1927 04/15/24 0130       REVIEW OF SYSTEMS:  Const: negative fever, negative chills, negative weight loss Eyes: negative diplopia or visual changes, negative eye pain ENT:  negative coryza, negative sore throat Resp: negative cough, hemoptysis, dyspnea Cards: negative for chest pain, palpitations, lower extremity edema GU: negative for frequency, dysuria and hematuria GI: abdominal pain, nausea, vomiting, poor appetite Skin: negative for rash and pruritus Heme: negative for easy bruising and gum/nose bleeding MS:  back pain and  weakness Neurolo:weakness legs Psych:  anxiety, depression  Endocrine: negative for thyroid , diabetes Allergy/Immunology- as above Objective:  VITALS:  BP (!) 140/77 (BP Location: Right Arm)   Pulse 100   Temp 98.1 F (36.7 C) (Oral)   Resp 16   Ht 5' (1.524 m)   Wt 85.9 kg   SpO2 100%   BMI 36.98 kg/m   PHYSICAL EXAM:  General: Awake, chronically ill, pale,  Head: Normocephalic, without obvious abnormality, atraumatic. Eyes: exophthalmos ENT Nares normal. No drainage or sinus tenderness. Lips, mucosa, and tongue normal. No Thrush Neck: Supple, symmetrical, no adenopathy, thyroid : non tender no carotid bruit and no JVD. Back: No CVA tenderness. Lungs: Clear to auscultation bilaterally. No Wheezing or Rhonchi. No rales. Heart: Regular rate and rhythm, no murmur, rub or gallop. 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 No masses Extremities: lower extremities edematous Skin: No rashes or lesions. Or bruising Lymph: Cervical, supraclavicular normal. Neurologic: paraparesis   Pertinent Labs Lab Results CBC    Component Value Date/Time   WBC 9.0 04/19/2024 0412   RBC 2.39 (L) 04/19/2024 0412   HGB 7.4 (L) 04/19/2024 0412   HCT 23.5 (L) 04/19/2024 0412   PLT 117 (L) 04/19/2024 0412   MCV 98.3 04/19/2024 0412   MCH 31.0 04/19/2024 0412   MCHC 31.5 04/19/2024 0412   RDW 20.3 (H) 04/19/2024 0412   LYMPHSABS 1.0 04/19/2024 0412   MONOABS 0.4 04/19/2024 0412   EOSABS 0.4 04/19/2024 0412   BASOSABS 0.0 04/19/2024 0412       Latest Ref Rng & Units 04/19/2024    5:20 PM 04/19/2024    4:12 AM 04/18/2024    1:33 PM  CMP  Glucose 70 - 99 mg/dL 161  096  045   BUN 8 - 23 mg/dL  12  17   Creatinine 4.09 - 1.00 mg/dL  8.11  9.14   Sodium 782 - 145 mmol/L  137  139   Potassium 3.5 - 5.1 mmol/L  3.4  3.3   Chloride 98 - 111 mmol/L  109  108   CO2 22 - 32 mmol/L  22  21   Calcium  8.9 - 10.3 mg/dL  6.6  6.8   Total Protein 6.5 - 8.1 g/dL  5.5  5.6   Total Bilirubin 0.0 - 1.2 mg/dL  4.2  5.0   Alkaline Phos 38 - 126 U/L  548  539   AST 15 - 41 U/L  98  105   ALT 0 - 44 U/L  46  47       Microbiology: Recent Results (from the past 240 hours)  Culture, blood (Routine X 2) w Reflex to ID Panel     Status: None   Collection Time: 04/14/24  7:12 PM   Specimen: BLOOD  Result Value Ref Range Status   Specimen Description BLOOD BLOOD LEFT HAND  Final   Special Requests   Final    BOTTLES DRAWN AEROBIC AND ANAEROBIC Blood Culture adequate volume   Culture   Final    NO GROWTH 5 DAYS Performed at [facility], [address] [place],  [place] [address]    Report Status 04/19/2024 FINAL  Final  Culture, blood (Routine X 2) w Reflex to ID Panel     Status: None   Collection Time: 04/14/24  7:13 PM   Specimen: BLOOD  Result Value  Ref Range Status   Specimen Description BLOOD BLOOD LEFT ARM  Final   Special Requests   Final    BOTTLES DRAWN AEROBIC AND ANAEROBIC Blood Culture adequate volume   Culture   Final    NO GROWTH 5 DAYS Performed at [facility], [address] [place], [place] [address]    Report Status 04/19/2024 FINAL  Final  MRSA Next Gen by PCR, Nasal     Status: Abnormal   Collection Time: 04/15/24  5:40 AM   Specimen: Nasal Mucosa; Nasal Swab  Result Value Ref Range Status   MRSA by PCR Next Gen DETECTED (A) NOT DETECTED Final    Comment: RESULT CALLED TO, READ BACK BY AND VERIFIED WITH: Diallo, Boubacar P, RN 04/15/24 0719 MW (NOTE) The GeneXpert MRSA Assay (FDA approved for NASAL specimens only), is one component of a comprehensive MRSA colonization surveillance program. It is not intended to diagnose MRSA infection nor to guide or monitor treatment for MRSA infections. Test performance is not FDA approved in patients less than 35 years old. Performed at [facility], [address] [place], [place] [address]     IMAGING RESULTS: Ct abdomen  Ct abdomen reviewed- multiple hypodense lesions liver - new since prior CT   I have personally reviewed the films ? 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 Risk for abscess sis high Need liver biopsy  Currently on zosyn  Antibiotic duration will be decided after that  Leucocytosis has resolved ? 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 I have personally spent  85---minutes involved in face-to-face and non-face-to-face activities for this patient on the day of the visit.  Professional time spent includes the following activities: Preparing to see the patient (review of tests), Obtaining and/or reviewing separately obtained history (admission/discharge record), Performing a medically appropriate examination and/or evaluation , Ordering medications/tests/procedures, referring and communicating with other health care professionals inculding hospitalist GI and oncologist , Documenting clinical information in the EMR, Independently interpreting results (not separately reported), Communicating results to the patient/fr, Counseling and educating the patient This consult involved complex antimicrobial management  ________________________________________________ Discussed with patient, requesting provider Note:  This document was prepared using Dragon voice recognition software and may include unintentional dictation errors.

## 2024-04-21 DIAGNOSIS — R160 Hepatomegaly, not elsewhere classified: Secondary | ICD-10-CM

## 2024-04-21 DIAGNOSIS — K831 Obstruction of bile duct: Secondary | ICD-10-CM

## 2024-04-21 DIAGNOSIS — K8309 Other cholangitis: Secondary | ICD-10-CM | POA: Diagnosis not present

## 2024-04-21 DIAGNOSIS — I5032 Chronic diastolic (congestive) heart failure: Secondary | ICD-10-CM | POA: Diagnosis not present

## 2024-04-21 LAB — CBC
HCT: 23.1 % — ABNORMAL LOW (ref 36.0–46.0)
Hemoglobin: 7.3 g/dL — ABNORMAL LOW (ref 12.0–15.0)
MCH: 31.3 pg (ref 26.0–34.0)
MCHC: 31.6 g/dL (ref 30.0–36.0)
MCV: 99.1 fL (ref 80.0–100.0)
Platelets: 183 10*3/uL (ref 150–400)
RBC: 2.33 MIL/uL — ABNORMAL LOW (ref 3.87–5.11)
RDW: 20.7 % — ABNORMAL HIGH (ref 11.5–15.5)
WBC: 10.3 10*3/uL (ref 4.0–10.5)
nRBC: 0 % (ref 0.0–0.2)

## 2024-04-21 LAB — COMPREHENSIVE METABOLIC PANEL WITH GFR
ALT: 47 U/L — ABNORMAL HIGH (ref 0–44)
AST: 98 U/L — ABNORMAL HIGH (ref 15–41)
Albumin: 1.6 g/dL — ABNORMAL LOW (ref 3.5–5.0)
Alkaline Phosphatase: 564 U/L — ABNORMAL HIGH (ref 38–126)
Anion gap: 6 (ref 5–15)
BUN: 7 mg/dL — ABNORMAL LOW (ref 8–23)
CO2: 23 mmol/L (ref 22–32)
Calcium: 7.1 mg/dL — ABNORMAL LOW (ref 8.9–10.3)
Chloride: 110 mmol/L (ref 98–111)
Creatinine, Ser: 0.62 mg/dL (ref 0.44–1.00)
GFR, Estimated: >60 mL/min (ref >60)
Glucose, Bld: 82 mg/dL (ref 70–99)
Potassium: 4.1 mmol/L (ref 3.5–5.1)
Sodium: 139 mmol/L (ref 135–145)
Total Bilirubin: 2.9 mg/dL — ABNORMAL HIGH (ref 0.0–1.2)
Total Protein: 5.7 g/dL — ABNORMAL LOW (ref 6.5–8.1)

## 2024-04-21 LAB — HEPARIN LEVEL (UNFRACTIONATED): Heparin Unfractionated: 0.87 [IU]/mL — ABNORMAL HIGH (ref 0.30–0.70)

## 2024-04-21 LAB — APTT
aPTT: 198 s (ref 24–36)
aPTT: 133 s — ABNORMAL HIGH (ref 24–36)

## 2024-04-21 MED ORDER — HEPARIN (PORCINE) 25000 UT/250ML-% IV SOLN
700.0000 [IU]/h | INTRAVENOUS | Status: DC
  Administered 2024-04-21: 700 [IU]/h via INTRAVENOUS

## 2024-04-21 MED ORDER — AMOXICILLIN-POT CLAVULANATE 875-125 MG PO TABS
1.0000 | ORAL_TABLET | Freq: Two times a day (BID) | ORAL | Status: DC
  Administered 2024-04-21 – 2024-04-24 (×6): 1 via ORAL
  Filled 2024-04-21 (×6): qty 1

## 2024-04-21 MED ORDER — HEPARIN (PORCINE) 25000 UT/250ML-% IV SOLN
500.0000 [IU]/h | INTRAVENOUS | Status: DC
  Administered 2024-04-21: 500 [IU]/h via INTRAVENOUS

## 2024-04-21 NOTE — Progress Notes (Signed)
 Date of Admission:  04/14/2024      ID: Stacey [name] is a 78 y.o. female Principal Problem:   Ascending cholangitis Active Problems:   HTN (hypertension)   CAD (coronary artery disease)   HLD (hyperlipidemia)   COPD (chronic obstructive pulmonary disease) (HCC)   CAD in native artery   Anxiety and depression   Bipolar 2 disorder, major depressive episode (HCC)   Metastasis to bone (HCC)   Chronic diastolic CHF (congestive heart failure) (HCC)   Obesity, Class II, BMI 35-39.9   Biliary obstruction   Chronic kidney disease, stage 3b (HCC)   Atrial fibrillation, chronic (HCC)   Thrombocytopenia (HCC)   Liver lesion    Subjective: Pt is stable Has bluish finger tips- says some pain  Medications:   (feeding supplement) PROSource Plus  30 mL Oral TID BM   amLODipine   5 mg Oral Daily   vitamin C  500 mg Oral BID   Chlorhexidine  Gluconate Cloth  6 each Topical Daily   diclofenac  Sodium  4 g Topical QID   DULoxetine   30 mg Oral Daily   feeding supplement  1 Container Oral TID BM   ferrous sulfate   325 mg Oral TID WC   guaiFENesin   600 mg Oral BID   losartan   100 mg Oral Daily   metoprolol  succinate  75 mg Oral Daily   multivitamin with minerals  1 tablet Oral Daily   mupirocin  ointment  1 Application Nasal BID   pantoprazole   40 mg Oral BID   polyethylene glycol  17 g Oral Daily   senna-docusate  1 tablet Oral BID   zinc sulfate (50mg  elemental zinc)  220 mg Oral Daily    Objective: Vital signs in last 24 hours: Patient Vitals for the past 24 hrs:  BP Temp Temp src Pulse Resp SpO2 Weight  04/21/24 1154 (!) 128/95 98.3 F (36.8 C) Oral 95 18 96 % --  04/21/24 1011 (!) 129/50 -- -- (!) 105 -- 100 % --  04/21/24 0857 124/84 -- -- (!) 102 -- -- --  04/21/24 0823 (!) 89/74 98.5 F (36.9 C) Oral (!) 107 18 100 % --  04/21/24 0500 -- -- -- -- -- -- 82.6 kg  04/21/24 0454 (!) 109/55 98.1 F (36.7 C) -- 90 20 100 % --  04/21/24 0022 (!) 147/77 98.3 F (36.8 C) --  (!) 40 20 91 % --  04/20/24 2027 119/71 98.2 F (36.8 C) -- 82 20 92 % --  04/20/24 1642 133/64 98.4 F (36.9 C) -- 88 -- 99 % --      PHYSICAL EXAM:  General: Alert, cooperative, no distress, appears stated age.  Lungs: Clear to auscultation bilaterally. No Wheezing or Rhonchi. No rales. Heart: Regular rate and rhythm, no murmur, rub or gallop. Abdomen: Soft, some tenderness over the right upper quadrant Extremities: Violaceous fingertips      skin: No rashes or lesions. Or bruising Lymph: Cervical, supraclavicular normal. Neurologic: Paraparesis    Lab Results    Latest Ref Rng & Units 04/21/2024    4:36 AM 04/19/2024    4:12 AM 04/18/2024    1:33 PM  CBC  WBC 4.0 - 10.5 K/uL 10.3  9.0  10.0   Hemoglobin 12.0 - 15.0 g/dL 7.3  7.4  7.8   Hematocrit 36.0 - 46.0 % 23.1  23.5  24.2   Platelets 150 - 400 K/uL 183  117  94        Latest Ref  Rng & Units 04/21/2024    4:36 AM 04/19/2024    5:20 PM 04/19/2024    4:12 AM  CMP  Glucose 70 - 99 mg/dL 82  161  096   BUN 8 - 23 mg/dL 7   12   Creatinine 0.45 - 1.00 mg/dL 4.09   8.11   Sodium 914 - 145 mmol/L 139   137   Potassium 3.5 - 5.1 mmol/L 4.1   3.4   Chloride 98 - 111 mmol/L 110   109   CO2 22 - 32 mmol/L 23   22   Calcium  8.9 - 10.3 mg/dL 7.1   6.6   Total Protein 6.5 - 8.1 g/dL 5.7   5.5   Total Bilirubin 0.0 - 1.2 mg/dL 2.9   4.2   Alkaline Phos 38 - 126 U/L 564   548   AST 15 - 41 U/L 98   98   ALT 0 - 44 U/L 47   46       Microbiology: Blood culture no growth Studies/Results: CT ABDOMEN PELVIS WO CONTRAST Result Date: 04/19/2024 CLINICAL DATA:  Abdominal pain, acute, nonlocalized EXAM: CT ABDOMEN AND PELVIS WITHOUT CONTRAST TECHNIQUE: Multidetector CT imaging of the abdomen and pelvis was performed following the standard protocol without IV contrast. RADIATION DOSE REDUCTION: This exam was performed according to the departmental dose-optimization program which includes automated exposure control, adjustment of  the mA and/or kV according to patient size and/or use of iterative reconstruction technique. COMPARISON:  March 08, 2024 FINDINGS: Of note, the lack of intravenous contrast limits evaluation of the solid organ parenchyma and vascularity. Lower chest: Small bilateral pleural effusions, slightly larger on the left than the right, with bibasilar atelectasis. Coronary artery atherosclerosis. Hepatobiliary: Multiple small hepatic hypodensities have developed in the interim. For example, in the posterior right hepatic dome there is a 1.7 cm hypodense lesion (axial 14). In the medial right hepatic lobe there is a 1.8 cm lesion (axial 19). Anterior left hepatic lobe lesion measuring 1.8 cm (axial 15). Cholecystectomy. Decompression of the intrahepatic biliary tree with diffuse pneumobilia. Well-positioned metallic biliary duct stent in the common bile duct. No radiopaque choledocholithiasis. Pancreas: No mass or main ductal dilation. No peripancreatic inflammation or fluid collection. Spleen: Normal size. No mass. Adrenals/Urinary Tract: No adrenal masses. Unchanged thick-walled cystic structure in the left kidney measuring 3.1 x 3 cm. No hydronephrosis or nephrolithiasis. The urinary bladder is completely decompressed. Stomach/Bowel: The stomach is decompressed without focal abnormality. No small bowel wall thickening or inflammation. No small bowel obstruction.Descending and sigmoid colonic diverticulosis. No changes of acute diverticulitis. Vascular/Lymphatic: No aortic aneurysm. Diffuse aortoiliac atherosclerosis. No intraabdominal or pelvic lymphadenopathy. Reproductive: Hysterectomy. No concerning adnexal mass. No free pelvic fluid. Other: No pneumoperitoneum. Small volume ascites predominantly in the left paracolic gutter. Musculoskeletal: No acute fracture or destructive lesion. Unchanged peripherally calcified mass in the lateral right breast. Diffuse osteopenia. Multilevel degenerative disc disease of the spine.  Large sclerotic lesion within the L2 vertebral body, unchanged. Severe pathologic compression fracture with retropulsion at L1 with retropulsion and moderate canal narrowing, unchanged. Multilevel degenerative disc disease of the spine. Diffuse anasarca. Infusion device in the right lower quadrant with a catheter extending into the thoracic spine, outside the field of view. IMPRESSION: 1. Interval development of multiple new hepatic hypodensities measuring up to 1.8 cm, worrisome for progressive metastatic disease. 2. Unchanged thick-walled left renal cystic lesion, consistent with renal cell carcinoma. 3. Small bilateral pleural effusions slightly larger on the right  than the left. Small volume ascites, predominantly in the left paracolic gutter. Anasarca. This is likely related to the patient's volume status. 4. Unchanged bony metastases in L1 and L2 with severe pathologic compression fracture of L1 and moderate canal narrowing. Electronically Signed   By: Rance Burrows M.D.   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  Unclear the etiology of the stricture Status post stent exchange now has a metal stent High alkaline phosphatase and high bilirubin improving  Ascending cholangitis question She has multiple hypodense lesions in the liver.  Mets versus abscesses risk for abscess is high because of the intrahepatic biliary dilated patient due to obstruction A liver biopsy would be helpful to send for culture and pathology.  Cannot be done until she is free of Eliquis  for 5 days Patient is currently on Zosyn  Can switch to p.o. 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  Discussed the management with the patient and the hospitalist.

## 2024-04-21 NOTE — Plan of Care (Signed)
  Problem: Education: Goal: Knowledge of General Education information will improve Description: Including pain rating scale, medication(s)/side effects and non-pharmacologic comfort measures Outcome: Progressing   Problem: Health Behavior/Discharge Planning: Goal: Ability to manage health-related needs will improve Outcome: Progressing   Problem: Clinical Measurements: Goal: Ability to maintain clinical measurements within normal limits will improve Outcome: Progressing   Problem: Clinical Measurements: Goal: Will remain free from infection Outcome: Progressing   Problem: Clinical Measurements: Goal: Respiratory complications will improve Outcome: Progressing   Problem: Clinical Measurements: Goal: Diagnostic test results will improve Outcome: Progressing   Problem: Activity: Goal: Risk for activity intolerance will decrease Outcome: Progressing   Problem: Nutrition: Goal: Adequate nutrition will be maintained Outcome: Progressing   Problem: Coping: Goal: Level of anxiety will decrease Outcome: Progressing   Problem: Elimination: Goal: Will not experience complications related to bowel motility Outcome: Progressing   Problem: Pain Managment: Goal: General experience of comfort will improve and/or be controlled Outcome: Progressing   Problem: Safety: Goal: Ability to remain free from injury will improve Outcome: Progressing   Problem: Skin Integrity: Goal: Risk for impaired skin integrity will decrease Outcome: Progressing    Plan of care ongoing,  see MAR see flowsheet

## 2024-04-21 NOTE — Progress Notes (Signed)
 PROGRESS NOTE    Stacey  B Stacey [name]  EXB:[id number] DOB: 05-18-1946 DOA: 04/14/2024 PCP: [name], MD    Brief Narrative:  78 y.o. female with medical history significant of hypertension, COPD, CAD, DES placement, diastolic CHF, A-fib on Eliquis , depression with anxiety, CKD-3B, PSVT, chronic pain, left bundle blockade, spinal stenosis, breast cancer metastasized to bone, left renal carcinoma, acute cholangitis and obstructive jaundice (s/p of ERCP), who presented to [facility] due to nausea, vomiting, right upper quadrant abdominal pain. Pt was accepted for transferring from [facility] per Dr. Isom Marin of [facility] ED on 5/13, arrived today.     Patient was recently hospitalized to [facility] on 4/9 - 4/18 due to acute cholangitis and obstructive jaundice.  Patient is s/p of ERCP, found to have duodenal ulcer, duodenal and sphincter stenosis.  Stent was placed and the purulent discharge was removed.    Patient states that she has nausea, multiple episodes of nonbilious nonbloody vomiting and abdominal pain in the past several days.  No fever or chills.  Abdominal pain is located in the upper abdomen, constant, mild to moderate, aching, nonradiating.  No diarrhea.  No fever or chills.  She states she had some chest discomfort yesterday, which has resolved, currently no active chest pain.  She has mild dry cough, mild SOB due to COPD.  No symptoms of UTI.   CTA of abdomen/pelvis done in [facility] showed severe intra- and extrahepatic biliary ductal dilation.  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  Common bile duct diffuse mucosa hyperenhancement, nonspecific, but can be seen with ascending cholangitis.  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  Osseous metastases not significantly changed.   Liver function with ALP 895, AST 135, ALT 41, total bilirubin 7.1, direct bilirubin 6.09.  Renal function stable, WBC 7.2, INR 1.23.  Temperature normal,  initial blood pressure low with SBP in 80s, which improved to 109/63 after 30 cc/kg normal saline resuscitation.  Heart rate 100, RR 15, oxygen  saturation 99% on room air.  Chest x-ray negative for infiltration. Pt was given Zosyn  in ED.   Dr. Isom Marin of ED in [facility] consulted Dr. Bridgett Camps of GI who reached to Dr. Ole Berkeley of [facility]. Dr. Ole Berkeley agreed to see pt after arrival to [facility]. Pt is accepted to PCU as inpt.  5/16: ERCP today. 5/20: Patient remains weak and fatigued.  Persistent hypoglycemia due to poor oral intake.  RD engaged for comment today.  Assessment & Plan:   Principal Problem:   Ascending cholangitis Active Problems:   Biliary obstruction   Atrial fibrillation, chronic (HCC)   CAD (coronary artery disease)   HLD (hyperlipidemia)   Chronic diastolic CHF (congestive heart failure) (HCC)   Chronic kidney disease, stage 3b (HCC)   CAD in native artery   COPD (chronic obstructive pulmonary disease) (HCC)   HTN (hypertension)   Thrombocytopenia (HCC)   Anxiety and depression   Bipolar 2 disorder, major depressive episode (HCC)   Obesity, Class II, BMI 35-39.9   Metastasis to bone (HCC)   Liver lesion  Ascending cholangitis and biliary obstruction:  Patient has WBC 23.6, no fever, clinically does not seem to have sepsis. GI consulted on admission. 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 Status post ERCP for ascending cholangitis.  Fully covered metal stent placed.  Bilirubin and LFTs downtrending.    Plan: Gi has signed off, per dr. Ole Berkeley this stent is permanent Continue zosyn , likely d/c tomorrow on  augmentin pending IR biopsy next week  Recurrent hypoglycemia Poor p.o. intake Hypoalbuminemia Suspected malnutrition Patient's oral intake has been negligent in the setting of recent biliary stent placement.  This is likely in the setting of significant metastatic disease. Plan: RD consultation Poor candidate for feeding tube, which patient declines anyway  Atrial  fibrillation, chronic (HCC Histsory PE:  Eliquis  resumed 5/19, now on heparin    CAD (coronary artery disease): Patent with no chest pain Years since last stent placed, on plavix  per cardiology. Will hold that   HLD (hyperlipidemia) Lipitor  hold   Chronic diastolic CHF (congestive heart failure) (HCC): 2D echo 03/25/21 showed EF of 50-55% with grade 1 diastolic dysfunction.  BNP is elevated 406, but patient does not have leg edema or JVD.  Does not seem to have CHF exacerbation. No indication for diuretics.  Continue to monitor volume status   Chronic kidney disease, stage 3b [facility]): Renal function stable.  Recent baseline creatinine 1.45 on 03/23/2024.  Her creatinine is 1.06, BUN 32. Creatinine appears at baseline.  Continue monitor.  Avoid nephrotoxins   COPD (chronic obstructive pulmonary disease) (HCC): Stable Continue as needed bronchodilators   HTN (hypertension) Continue home Norvasc , Cozaar , Lopressor    Thrombocytopenia (HCC):   Appears mild and chronic.  No bleeding noted. Monitor carefully while on anticoagulation and antiplatelet   Anxiety and depression and bipolar 2 disorder, major depressive episode (HCC) Continue home Cymbalta   Obesity, Class II, BMI 35-39.9: Body weight 87.5 kg, BMI 37.69 Encouraged weight loss and diet.  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  Stable abdominal pain. No other complaints.  Objective: Vitals:    04/21/24 0823 04/21/24 0857 04/21/24 1011 04/21/24 1154  BP: (!) 89/74 124/84 (!) 129/50 (!) 128/95  Pulse: (!) 107 (!) 102 (!) 105 95  Resp: 18   18  Temp: 98.5 F (36.9 C)   98.3 F (36.8 C)  TempSrc: Oral   Oral  SpO2: 100%  100% 96%  Weight:      Height:        Intake/Output Summary (Last 24 hours) at 04/21/2024 1454 Last data filed at 04/21/2024 0034 Gross per 24 hour  Intake 10.87 ml  Output 500 ml  Net -489.13 ml   Filed Weights   04/19/24 0415 04/20/24 0500 04/21/24 0500  Weight: 85.1 kg 85.9 kg 82.6 kg    Examination:  General exam: Appears frail, fatigued, chronically ill Respiratory system: Poor respiratory effort.  Lungs clear.  Room air Cardiovascular system: S1-S2, RRR, no murmurs, no pedal edema Gastrointestinal system: Obese, nondistended, soft Central nervous system: moving all 4, alert Extremities: Symmetric 5 x 5 power. Skin: bruising extremities Psychiatry: Judgement and insight appear impaired. Mood & affect flattened.     Data Reviewed: I have personally reviewed following labs and imaging studies  CBC: Recent Labs  Lab 04/14/24 1912 04/15/24 0444 04/16/24 0928 04/17/24 0302 04/17/24 1242 04/18/24 0516 04/18/24 1333 04/19/24 0412 04/21/24 0436  WBC 23.6*   < > 15.6*   < > 9.1 10.3 10.0 9.0 10.3  NEUTROABS 22.2*  --  13.4*  --   --  7.7  --  6.6  --   HGB 9.8*   < > 8.5*   < > 7.7* 8.0* 7.8* 7.4* 7.3*  HCT 30.7*   < > 25.8*   < > 23.7* 25.3* 24.2* 23.5* 23.1*  MCV 96.5   < > 94.9   < > 95.6 96.9 96.8 98.3 99.1  PLT 93*   < > 85*   < > 77* 102* 94* 117* 183   < > = values in this interval not displayed.   Basic Metabolic Panel: Recent Labs  Lab 04/16/24 0626 04/16/24 0928 04/17/24 1242 04/18/24 1333 04/19/24 0412 04/19/24 1720 04/21/24 0436  NA  --  136 140 139 137  --  139  K  --  3.3* 3.0* 3.3* 3.4*  --  4.1  CL  --  107 112* 108 109  --  110  CO2  --  21* 20* 21* 22  --  23  GLUCOSE  --  124* 90 136* 117* 122* 82  BUN  --   29* 24* 17 12  --  7*  CREATININE  --  1.05* 0.95 0.84 0.74  --  0.62  CALCIUM   --  6.8* 6.6* 6.8* 6.6*  --  7.1*  MG 2.0  --   --   --   --   --   --    GFR: Estimated Creatinine Clearance: 56.1 mL/min (by C-G formula based on SCr of 0.62 mg/dL). Liver Function Tests: Recent Labs  Lab 04/16/24 0928 04/17/24 1242 04/18/24 1333 04/19/24 0412 04/21/24 0436  AST 163* 132* 105* 98* 98*  ALT 59* 51* 47* 46* 47*  ALKPHOS 588* 541* 539* 548* 564*  BILITOT 5.6* 5.0* 5.0* 4.2* 2.9*  PROT 6.0* 5.3* 5.6* 5.5* 5.7*  ALBUMIN  1.9* 1.7* 1.6* 1.5* 1.6*   Recent Labs  Lab 04/16/24 0928  LIPASE 87*   No results for input(s): "AMMONIA" in the last 168 hours. Coagulation Profile: Recent Labs  Lab 04/14/24 1912 04/20/24 1937  INR 1.2 1.2   Cardiac Enzymes: No results for input(s): "CKTOTAL", "CKMB", "CKMBINDEX", "TROPONINI" in the last 168 hours. BNP (last 3 results) No results for input(s): "PROBNP" in the last 8760 hours. HbA1C: No results for input(s): "HGBA1C" in the last 72 hours. CBG: Recent Labs  Lab 04/20/24 0810 04/20/24 0838 04/20/24 1108 04/20/24 1632 04/20/24 1725  GLUCAP 50* 122* 123* 64* 92   Lipid Profile: No results for input(s): "CHOL", "HDL", "LDLCALC", "TRIG", "CHOLHDL", "LDLDIRECT" in the last 72 hours. Thyroid  Function Tests: No results for input(s): "TSH", "T4TOTAL", "FREET4", "T3FREE", "THYROIDAB" in the last 72 hours. Anemia Panel: No results for input(s): "VITAMINB12", "FOLATE", "FERRITIN", "TIBC", "IRON", "RETICCTPCT" in the last 72 hours. Sepsis Labs: No results for input(s): "PROCALCITON", "LATICACIDVEN" in the last 168 hours.  Recent Results (from the past 240 hours)  Culture, blood (Routine X 2) w Reflex to ID Panel     Status: None   Collection Time: 04/14/24  7:12 PM   Specimen: BLOOD  Result Value Ref Range Status   Specimen Description BLOOD BLOOD LEFT HAND  Final   Special Requests   Final    BOTTLES DRAWN AEROBIC AND ANAEROBIC Blood  Culture adequate volume   Culture   Final    NO GROWTH 5 DAYS Performed at [facility], [address] [place], [place] [address]    Report Status 04/19/2024 FINAL  Final  Culture, blood (Routine  X 2) w Reflex to ID Panel     Status: None   Collection Time: 04/14/24  7:13 PM   Specimen: BLOOD  Result Value Ref Range Status   Specimen Description BLOOD BLOOD LEFT ARM  Final   Special Requests   Final    BOTTLES DRAWN AEROBIC AND ANAEROBIC Blood Culture adequate volume   Culture   Final    NO GROWTH 5 DAYS Performed at [facility], [address], [place], [place] [address]    Report Status 04/19/2024 FINAL  Final  MRSA Next Gen by PCR, Nasal     Status: Abnormal   Collection Time: 04/15/24  5:40 AM   Specimen: Nasal Mucosa; Nasal Swab  Result Value Ref Range Status   MRSA by PCR Next Gen DETECTED (A) NOT DETECTED Final    Comment: RESULT CALLED TO, READ BACK BY AND VERIFIED WITH: Diallo, Boubacar P, RN 04/15/24 0719 MW (NOTE) The GeneXpert MRSA Assay (FDA approved for NASAL specimens only), is one component of a comprehensive MRSA colonization surveillance program. It is not intended to diagnose MRSA infection nor to guide or monitor treatment for MRSA infections. Test performance is not FDA approved in patients less than 15 years old. Performed at [facility], [address] [place], [place] [address]          Radiology Studies: CT ABDOMEN PELVIS WO CONTRAST Result Date: 04/19/2024 CLINICAL DATA:  Abdominal pain, acute, nonlocalized EXAM: CT ABDOMEN AND PELVIS WITHOUT CONTRAST TECHNIQUE: Multidetector CT imaging of the abdomen and pelvis was performed following the standard protocol without IV contrast. RADIATION DOSE REDUCTION: This exam was performed according to the departmental dose-optimization program which includes automated exposure control, adjustment of the mA and/or kV according to patient size and/or use of iterative  reconstruction technique. COMPARISON:  March 08, 2024 FINDINGS: Of note, the lack of intravenous contrast limits evaluation of the solid organ parenchyma and vascularity. Lower chest: Small bilateral pleural effusions, slightly larger on the left than the right, with bibasilar atelectasis. Coronary artery atherosclerosis. Hepatobiliary: Multiple small hepatic hypodensities have developed in the interim. For example, in the posterior right hepatic dome there is a 1.7 cm hypodense lesion (axial 14). In the medial right hepatic lobe there is a 1.8 cm lesion (axial 19). Anterior left hepatic lobe lesion measuring 1.8 cm (axial 15). Cholecystectomy. Decompression of the intrahepatic biliary tree with diffuse pneumobilia. Well-positioned metallic biliary duct stent in the common bile duct. No radiopaque choledocholithiasis. Pancreas: No mass or main ductal dilation. No peripancreatic inflammation or fluid collection. Spleen: Normal size. No mass. Adrenals/Urinary Tract: No adrenal masses. Unchanged thick-walled cystic structure in the left kidney measuring 3.1 x 3 cm. No hydronephrosis or nephrolithiasis. The urinary bladder is completely decompressed. Stomach/Bowel: The stomach is decompressed without focal abnormality. No small bowel wall thickening or inflammation. No small bowel obstruction.Descending and sigmoid colonic diverticulosis. No changes of acute diverticulitis. Vascular/Lymphatic: No aortic aneurysm. Diffuse aortoiliac atherosclerosis. No intraabdominal or pelvic lymphadenopathy. Reproductive: Hysterectomy. No concerning adnexal mass. No free pelvic fluid. Other: No pneumoperitoneum. Small volume ascites predominantly in the left paracolic gutter. Musculoskeletal: No acute fracture or destructive lesion. Unchanged peripherally calcified mass in the lateral right breast. Diffuse osteopenia. Multilevel degenerative disc disease of the spine. Large sclerotic lesion within the L2 vertebral body, unchanged.  Severe pathologic compression fracture with retropulsion at L1 with retropulsion and moderate canal narrowing, unchanged. Multilevel degenerative disc disease of the spine. Diffuse anasarca. Infusion device in the right lower quadrant with  a catheter extending into the thoracic spine, outside the field of view. IMPRESSION: 1. Interval development of multiple new hepatic hypodensities measuring up to 1.8 cm, worrisome for progressive metastatic disease. 2. Unchanged thick-walled left renal cystic lesion, consistent with renal cell carcinoma. 3. Small bilateral pleural effusions slightly larger on the right than the left. Small volume ascites, predominantly in the left paracolic gutter. Anasarca. This is likely related to the patient's volume status. 4. Unchanged bony metastases in L1 and L2 with severe pathologic compression fracture of L1 and moderate canal narrowing. Electronically Signed   By: Rance Burrows M.D.   On: 04/19/2024 21:13         Scheduled Meds:  (feeding supplement) PROSource Plus  30 mL Oral TID BM   amLODipine   5 mg Oral Daily   vitamin C  500 mg Oral BID   Chlorhexidine  Gluconate Cloth  6 each Topical Daily   diclofenac  Sodium  4 g Topical QID   DULoxetine   30 mg Oral Daily   feeding supplement  1 Container Oral TID BM   ferrous sulfate   325 mg Oral TID WC   guaiFENesin   600 mg Oral BID   losartan   100 mg Oral Daily   metoprolol  succinate  75 mg Oral Daily   multivitamin with minerals  1 tablet Oral Daily   mupirocin  ointment  1 Application Nasal BID   pantoprazole   40 mg Oral BID   polyethylene glycol  17 g Oral Daily   senna-docusate  1 tablet Oral BID   zinc sulfate (50mg  elemental zinc)  220 mg Oral Daily   Continuous Infusions:  heparin  700 Units/hr (04/21/24 0659)   piperacillin -tazobactam (ZOSYN )  IV 3.375 g (04/21/24 1140)     LOS: 7 days     Raymonde Calico, MD [facility]   If 7PM-7AM, please contact night-coverage  04/21/2024, 2:54 PM

## 2024-04-21 NOTE — Progress Notes (Signed)
 PHARMACY - ANTICOAGULATION CONSULT NOTE  Pharmacy Consult for heparin  Indication: atrial fibrillation  Allergies  Allergen Reactions   Brilinta  [Ticagrelor ] Diarrhea and Nausea Only    Nausea and severe diarrhea- general weakness. Patient says does not want to take again   Budesonide  Palpitations   Albuterol  Other (See Comments)    Heart racing     Patient Measurements: Height: 5' (152.4 cm) Weight: 82.6 kg (182 lb 1.6 oz) IBW/kg (Calculated) : 45.5 HEPARIN  DW (KG): 66.1  Vital Signs: Temp: 98.3 F (36.8 C) (05/22 1154) Temp Source: Oral (05/22 1154) BP: 128/95 (05/22 1154) Pulse Rate: 95 (05/22 1154)  Labs: Recent Labs    04/19/24 0412 04/20/24 1937 04/21/24 0436 04/21/24 1438  HGB 7.4*  --  7.3*  --   HCT 23.5*  --  23.1*  --   PLT 117*  --  183  --   APTT  --   --  198* 133*  LABPROT  --  15.5*  --   --   INR  --  1.2  --   --   HEPARINUNFRC  --   --  0.87*  --   CREATININE 0.74  --  0.62  --     Estimated Creatinine Clearance: 56.1 mL/min (by C-G formula based on SCr of 0.62 mg/dL).   Medical History: Past Medical History:  Diagnosis Date   CAD in native artery    a. DES to ramus 2005 with late stent thrombosis 2006 tx with PTCA. 12/18 PCI/DES x1 to mRCA, EF 50-55%   CHF (congestive heart failure) (HCC)    Chronic pain    COPD (chronic obstructive pulmonary disease) (HCC)    Hyperlipidemia    Hypertension    Left bundle branch block    Lymphedema    Metastatic breast cancer    a. to bone.   MI (myocardial infarction) (HCC)    Mild aortic stenosis 10/2017   Morbid obesity (HCC)    PAF (paroxysmal atrial fibrillation) (HCC)    PSVT (paroxysmal supraventricular tachycardia) (HCC)    a. per [facility] notes, seen on event monitor in 2014.   Pulmonary nodules    Assessment: 78 y/o female presenting with right upper quadrant abdominal pain with nausea and vomiting. PMH significant for hypertension, COPD, CAD, DES placement, diastolic CHF, A-fib on  Eliquis , depression with anxiety, CKD-3B, PSVT, chronic pain, breast cancer metastasized to bone, left renal carcinoma, acute cholangitis and obstructive jaundice (s/p of ERCP). Pharmacy has been consulted to transition to heparin  infusion.  Last dose of apixaban : 2.5 mg on 5/21 at 0821 Baseline labs: hgb 7.4, plt 117  5/22 @ 0436:  aPTT = 198,  HL = 0.87 5/22 @ 1438: aPTT = 133  Goal of Therapy:  Heparin  level 0.3-0.7 units/ml aPTT 66-102 seconds Monitor platelets by anticoagulation protocol: Yes   Plan:  5/22 @ 1438: aPTT = 133 - aPTT SUPRAtherapeutic - will hold heparin  drip for 1 hr and restart @ 500 units/hr - will recheck aPTT 8 hrs after restart - will recheck HL on 5/23 with AM labs - Monitor aPTT levels until correlating with heparin  levels - Monitor CBC and signs/symptoms of bleeding  Thank you for involving pharmacy in this patient's care.   Alice Innocent, PharmD Clinical Pharmacist 04/21/2024 4:05 PM

## 2024-04-21 NOTE — Progress Notes (Signed)
 PHARMACY - ANTICOAGULATION CONSULT NOTE  Pharmacy Consult for heparin  Indication: atrial fibrillation  Allergies  Allergen Reactions   Brilinta  [Ticagrelor ] Diarrhea and Nausea Only    Nausea and severe diarrhea- general weakness. Patient says does not want to take again   Budesonide  Palpitations   Albuterol  Other (See Comments)    Heart racing     Patient Measurements: Height: 5' (152.4 cm) Weight: 85.9 kg (189 lb 6 oz) IBW/kg (Calculated) : 45.5 HEPARIN  DW (KG): 66.1  Vital Signs: Temp: 98.1 F (36.7 C) (05/22 0454) BP: 109/55 (05/22 0454) Pulse Rate: 90 (05/22 0454)  Labs: Recent Labs    04/18/24 1333 04/19/24 0412 04/20/24 1937 04/21/24 0436  HGB 7.8* 7.4*  --  7.3*  HCT 24.2* 23.5*  --  23.1*  PLT 94* 117*  --  183  APTT  --   --   --  198*  LABPROT  --   --  15.5*  --   INR  --   --  1.2  --   HEPARINUNFRC  --   --   --  0.87*  CREATININE 0.84 0.74  --   --     Estimated Creatinine Clearance: 57.4 mL/min (by C-G formula based on SCr of 0.74 mg/dL).   Medical History: Past Medical History:  Diagnosis Date   CAD in native artery    a. DES to ramus 2005 with late stent thrombosis 2006 tx with PTCA. 12/18 PCI/DES x1 to mRCA, EF 50-55%   CHF (congestive heart failure) (HCC)    Chronic pain    COPD (chronic obstructive pulmonary disease) (HCC)    Hyperlipidemia    Hypertension    Left bundle branch block    Lymphedema    Metastatic breast cancer    a. to bone.   MI (myocardial infarction) (HCC)    Mild aortic stenosis 10/2017   Morbid obesity (HCC)    PAF (paroxysmal atrial fibrillation) (HCC)    PSVT (paroxysmal supraventricular tachycardia) (HCC)    a. per [facility] notes, seen on event monitor in 2014.   Pulmonary nodules    Assessment: 78 y/o female presenting with right upper quadrant abdominal pain with nausea and vomiting. PMH significant for hypertension, COPD, CAD, DES placement, diastolic CHF, A-fib on Eliquis , depression with anxiety,  CKD-3B, PSVT, chronic pain, breast cancer metastasized to bone, left renal carcinoma, acute cholangitis and obstructive jaundice (s/p of ERCP). Pharmacy has been consulted to transition to heparin  infusion.  Last dose of apixaban : 2.5 mg on 5/21 at 0821 Baseline labs: hgb 7.4, plt 117  Goal of Therapy:  Heparin  level 0.3-0.7 units/ml aPTT 66-102 seconds Monitor platelets by anticoagulation protocol: Yes   Plan:  5/22 @ 0436:  aPTT = 198,  HL = 0.87 - aPTT SUPRAtherapeutic,  HL elevated from PTA Eliquis  - will hold heparin  drip for 1 hr and restart @ 700 units/hr - will recheck aPTT 8 hrs after restart - will recheck HL on 5/23 with AM labs Monitor aPTT levels until correlating with heparin  levels Monitor CBC and signs/symptoms of bleeding  Thank you for involving pharmacy in this patient's care.   Ikesha Siller D Clinical Pharmacist 04/21/2024 5:42 AM

## 2024-04-21 NOTE — Progress Notes (Signed)
 Palliative Care Progress Note, Assessment & Plan   Patient Name: Stacey [name]  RUNA WHITTINGHAM       Date: 04/21/2024 DOB: [birth year]  Age: 78 y.o. MRN#: [id number] Attending Physician: Janeane Mealy, MD Primary Care Physician: [name], MD Admit Date: 04/14/2024  Subjective: Patient is lying in bed, awake and alert.  She acknowledges my presence and is able to make her wishes known.  She remains jaundiced.  No family or friends present during my visit.  HPI: 78 y.o. female  with past medical history of hypertension, COPD, CAD, DES placement, diastolic CHF, A-fib on Eliquis , depression with anxiety, CKD-3B, PSVT, chronic pain, left bundle blockade, spinal stenosis, breast cancer metastasized to bone, left renal carcinoma, acute cholangitis and obstructive jaundice (s/p of ERCP), and recent hospitalization at [facility] for acute cholangitis and obstructive jaundice admitted transfer from [facility] on 04/14/2024 with right upper quadrant abdominal pain with nausea and vomiting.   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  Stent was placed and purulent discharge was removed.   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  Additionally, common bile duct diffuse mucosal hyperenhancement that was nonspecific but seen with ascending cholangitis.  Left renal presumed renal cell carcinoma with slightly increased in size and osseous metastases was not significantly changed.   5/20, CT of abdomen and pelvis without contract revealed: 1. Interval development of multiple new hepatic hypodensities measuring up to 1.8 cm,  worrisome for progressive metastatic disease. 2. Unchanged thick-walled left renal cystic lesion, consistent with renal cell carcinoma. 3. Small bilateral pleural effusions slightly larger on the right than the left. Small volume ascites, predominantly in the left paracolic gutter. Anasarca. This is likely related to the patient's volume status. 4. Unchanged bony metastases in L1 and L2 with severe pathologic compression fracture of L1 and moderate canal narrowing.   PMT was consulted to support patient and family with goals of care discussions.  Summary of counseling/coordination of care: Extensive chart review completed prior to meeting patient including labs, vital signs, imaging, progress notes, orders, and available advanced directive documents from current and previous encounters.   After reviewing the patient's chart and assessing the patient at bedside, I spoke with patient in regards to symptom management and goals of care.   Symptoms assessed.  Patient endorses being tired of being in the hospital.  She denies headache, chest pain, nausea/vomiting, or other acute ailments at this time.  She endorses that she experiences "all over pain" but is vague about exactly where her pain is currently.  I attempted to gauge patient's understanding of her current medical situation.  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  She says that she is "not ready to give up".  I highlighted our discussion from yesterday wherein patient said she was tired and ready to stop treatments.  Today, she shares that she is ready to "go forward with the procedures".  When asked to elaborate what procedure she was referring to, patient could not vocalize it.  Discussed biopsy of liver.  She shares she  is prepared to have the biopsy and "continue to fight".  Patient shares she wishes to discuss her plan of care and options with her family.  She endorses they plan to be bedside this afternoon/evening.  I reminded patient that  I spoke with patient's son yesterday.  He shares that he and his sister will be bedside today at some point.  They plan to reach out to PMT when ready to discuss goals.  PMT remains labile to patient and family throughout her hospitalization.  Awaiting family to come bedside to continue goals of care discussions.  DNR with limited interventions remains.  No adjustment to plan of care at this time.  Physical Exam Vitals reviewed.  HENT:     Head: Normocephalic.     Nose: Nose normal.     Mouth/Throat:     Mouth: Mucous membranes are moist.  Eyes:     Pupils: Pupils are equal, round, and reactive to light.  Cardiovascular:     Rate and Rhythm: Normal rate.     Pulses: Normal pulses.  Pulmonary:     Effort: Pulmonary effort is normal.  Abdominal:     Palpations: Abdomen is soft.  Skin:    General: Skin is dry.     Coloration: Skin is jaundiced.  Neurological:     Mental Status: She is alert.  Psychiatric:        Mood and Affect: Mood normal.        Behavior: Behavior normal.             Total Time 35 minutes   Time spent includes: Detailed review of medical records (labs, imaging, vital signs), medically appropriate exam (mental status, respiratory, cardiac, skin), discussed with treatment team, counseling and educating patient, family and staff, documenting clinical information, medication management and coordination of care.  Judeen Nose L. Rebbeca Campi, DNP, FNP-BC Palliative Medicine Team

## 2024-04-21 NOTE — Consult Note (Signed)
 Chief Complaint: Patient was seen in consultation today for liver lesions, with consideration for biopsy.  Referring Provider(s): Dr. Hines Ludwig, MD   Supervising Physician: Myrlene Asper  Patient Status: [facility] - In-pt  Patient is Full Code  History of Present Illness: Stacey  B [name] is a 78 y.o. female  with PMHx notable for HTN, HLD, CAD and MI, CHF, COPD, PAF, metastatic breast cancer, left renal mass, and mild aortic stenosis.  Per Dr. Emmaline Haring progress note on 5/21: "Stage IV breast cancer with bone metastasis, presumed RCC Previously progressed on Letrozol, currently on fulvestrant  and Xgeva  monthly..  Recent CT showed multiple new liver lesions, this was not reported on her recent CT done at [facility] 1 week ago.  Differential diagnosis includes metastatic disease, liver abscess. 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 s/p ERCP with CBD stent placement.  bilirubin is trending down. Cytology from brushing of main bile duct was negative for malignancy   I recommend liver biopsy to clarify tissue diagnosis.  Her metastatic breast cancer disease has been fairly stable over the past few years.  Liver lesions are new since prior CT done 1 week ago.  Raising suspicion of liver abscess.  She is at high risk due to recent obstructive jaundice. If she truly has metastatic disease to liver, given limitation of treatment options due to her poor performance status, I think comfort measures/hospice is a reasonable option.  If she desires treatment, I will defer to [facility] oncology.  She will need to get molecular testing done to see if any targeted therapy is available. Patient agrees with the plan."   Interventional Radiology was requested for liver lesion biopsy. Request was reviewed and approved by Dr. Mabel Savage. Patient is scheduled for same in IR today.   Patient is alert and laying in bed, calm.  Patient is currently without any significant complaints. She admits  to some chronic back pain. Patient denies any fevers, headache, chest pain, SOB, cough, abdominal pain, nausea, vomiting or bleeding.     Past Medical History:  Diagnosis Date   CAD in native artery    a. DES to ramus 2005 with late stent thrombosis 2006 tx with PTCA. 12/18 PCI/DES x1 to mRCA, EF 50-55%   CHF (congestive heart failure) (HCC)    Chronic pain    COPD (chronic obstructive pulmonary disease) (HCC)    Hyperlipidemia    Hypertension    Left bundle branch block    Lymphedema    Metastatic breast cancer    a. to bone.   MI (myocardial infarction) (HCC)    Mild aortic stenosis 10/2017   Morbid obesity (HCC)    PAF (paroxysmal atrial fibrillation) (HCC)    PSVT (paroxysmal supraventricular tachycardia) (HCC)    a. per [facility] notes, seen on event monitor in 2014.   Pulmonary nodules     Past Surgical History:  Procedure Laterality Date   BILIARY STENT PLACEMENT  03/09/2024   Procedure: INSERTION, STENT, BILE DUCT;  Surgeon: Marnee Sink, MD;  Location: [facility] ENDOSCOPY;  Service: Endoscopy;;   CHOLECYSTECTOMY N/A 03/29/2021   Procedure: LAPAROSCOPIC CHOLECYSTECTOMY;  Surgeon: Awilda Bogus, MD;  Location: [facility] ORS;  Service: General;  Laterality: N/A;   CORONARY STENT INTERVENTION N/A 11/26/2017   Procedure: CORONARY STENT INTERVENTION;  Surgeon: Swaziland, Peter M, MD;  Location: [facility] INVASIVE CV LAB;  Service: Cardiovascular;  Laterality: N/A;   CORONARY STENT PLACEMENT     ERCP N/A 03/28/2021   Procedure: ENDOSCOPIC RETROGRADE  CHOLANGIOPANCREATOGRAPHY (ERCP);  Surgeon: Ruby Corporal, MD;  Location: [facility] ORS;  Service: Endoscopy;  Laterality: N/A;   ERCP N/A 03/09/2024   Procedure: ERCP, WITH INTERVENTION IF INDICATED;  Surgeon: Marnee Sink, MD;  Location: [facility] ENDOSCOPY;  Service: Endoscopy;  Laterality: N/A;   ERCP N/A 04/15/2024   Procedure: ERCP, WITH INTERVENTION IF INDICATED;  Surgeon: Marnee Sink, MD;  Location: [facility] ENDOSCOPY;  Service: Endoscopy;  Laterality: N/A;    ESOPHAGOGASTRODUODENOSCOPY  03/09/2024   Procedure: EGD (ESOPHAGOGASTRODUODENOSCOPY);  Surgeon: Marnee Sink, MD;  Location[facility] ENDOSCOPY;  Service: Endoscopy;;   intrathecal pain pump     LEFT HEART CATH AND CORONARY ANGIOGRAPHY N/A 11/26/2017   Procedure: LEFT HEART CATH AND CORONARY ANGIOGRAPHY;  Surgeon: Swaziland, Peter M, MD;  Location: [facility] INVASIVE CV LAB;  Service: Cardiovascular;  Laterality: N/A;   REMOVAL OF STONES  03/28/2021   Procedure: REMOVAL OF STONES;  Surgeon: Ruby Corporal, MD;  Location: [facility] ORS;  Service: Endoscopy;;   SPHINCTEROTOMY  03/28/2021   Procedure: SPHINCTEROTOMY;  Surgeon: Ruby Corporal, MD;  Location: [facility] OR[facility];  Service: Endoscopy;;   UMBILICAL HERNIA REPAIR N/A 04/03/2021   Procedure: ADULT PRIMARY UMBILICAL HERNIA REPAIR;  Surgeon: Awilda Bogus, MD;  Location: [facility] ORS;  Service: General;  Laterality: N/A;   VASCULAR SURGERY      Allergies: Brilinta  [ticagrelor ], Budesonide , and Albuterol   Medications: Prior to Admission medications   Medication Sig Start Date End Date Taking? Authorizing Provider  amLODipine  (NORVASC ) 5 MG tablet TAKE 1 TABLET EVERY DAY 04/06/24  Yes Branch, Joyceann No, MD  apixaban  (ELIQUIS ) 2.5 MG TABS tablet Take 2.5 mg by mouth 2 (two) times daily.   Yes [provider]  atorvastatin  (LIPITOR ) 80 MG tablet Take 80 mg by mouth daily.   Yes [provider]  b complex vitamins capsule Take 1 capsule by mouth daily.   Yes [provider]  carboxymethylcellulose (REFRESH PLUS) 0.5 % SOLN Place 1 drop into both eyes 3 (three) times daily as needed (Dry Eyes).   Yes [provider]  clopidogrel  (PLAVIX ) 75 MG tablet Take 75 mg by mouth daily.   Yes [provider]  diclofenac  Sodium (VOLTAREN ) 1 % GEL Apply topically. 02/25/24  Yes [provider]  DULoxetine  (CYMBALTA ) 30 MG capsule Take 30 mg by mouth daily.   Yes [provider]  ferrous sulfate  325 (65 FE) MG EC tablet  Take 325 mg by mouth 3 (three) times daily with meals.   Yes [provider]  HYDROmorphone  (DILAUDID ) 1 MG/ML injection Inject 0.5 mLs (0.5 mg total) into the vein every 2 (two) hours as needed for severe pain (pain score 7-10) or moderate pain (pain score 4-6). 03/09/24  Yes Alfonse Angle, Benuel Brazier, MD  HYDROmorphone  (DILAUDID ) 2 MG tablet Take 4 mg by mouth every 4 (four) hours as needed for severe pain (pain score 7-10).   Yes [provider]  losartan  (COZAAR ) 100 MG tablet TAKE 1 TABLET EVERY DAY 04/06/24  Yes Branch, Joyceann No, MD  methocarbamol  (ROBAXIN ) 500 MG tablet Take 500 mg by mouth every 6 (six) hours as needed for muscle spasms.   Yes [provider]  metoprolol  succinate (TOPROL -XL) 25 MG 24 hr tablet Take 3 tablets (75 mg total) by mouth daily. 03/19/24 04/18/24 Yes Alphonsus Jeans, MD  nitroGLYCERIN  (NITROSTAT ) 0.4 MG SL tablet Place 0.4 mg under the tongue every 5 (five) minutes as needed for chest pain.   Yes [provider]  ondansetron  (  ZOFRAN ) 4 MG/2ML SOLN injection Inject 2 mLs (4 mg total) into the vein every 6 (six) hours as needed for nausea or vomiting. 03/09/24  Yes Alfonse Angle, Benuel Brazier, MD  pantoprazole  (PROTONIX ) 40 MG tablet Take 1 tablet (40 mg total) by mouth 2 (two) times daily. 03/18/24 04/17/24 Yes Alphonsus Jeans, MD     Family History  Problem Relation Age of Onset   CAD Father 3   Heart attack Father    COPD Sister    CAD Paternal Grandmother    Sudden Cardiac Death Neg Hx    Colon polyps Neg Hx    Colon cancer Neg Hx     Social History   Socioeconomic History   Marital status: Single    Spouse name: Not on file   Number of children: Not on file   Years of education: Not on file   Highest education level: Not on file  Occupational History   Occupation: Retired  Tobacco Use   Smoking status: Former    Current packs/day: 0.00    Average packs/day: 2.0 packs/day for 18.0 years (36.0 ttl pk-yrs)    Types: Cigarettes     Start date: 12/01/1962    Quit date: 12/01/1980    Years since quitting: 43.4   Smokeless tobacco: Never  Vaping Use   Vaping status: Never Used  Substance and Sexual Activity   Alcohol  use: No   Drug use: Yes    Types: Oxycodone , Morphine     Comment: takes methadone    Sexual activity: Not Currently  Other Topics Concern   Not on file  Social History Narrative   Not on file   Social Drivers of Health   Financial Resource Strain: High Risk (01/04/2024)   Received from [facility]   Overall Financial Resource Strain (CARDIA)    Difficulty of Paying Living Expenses: Hard  Food Insecurity: No Food Insecurity (04/14/2024)   Hunger Vital Sign    Worried About Running Out of Food in the Last Year: Never true    Ran Out of Food in the Last Year: Never true  Transportation Needs: No Transportation Needs (04/14/2024)   PRAPARE - Administrator, Civil Service (Medical): No    Lack of Transportation (Non-Medical): No  Physical Activity: Inactive (08/17/2023)   Received from [facility]   Exercise Vital Sign    Days of Exercise per Week: 0 days    Minutes of Exercise per Session: 0 min  Stress: No Stress Concern Present (08/17/2023)   Received from [facility] - Occupational Stress Questionnaire    Feeling of Stress : Only a little  Social Connections: Unknown (04/14/2024)   Social Connection and Isolation Panel [NHANES]    Frequency of Communication with Friends and Family: More than three times a week    Frequency of Social Gatherings with Friends and Family: More than three times a week    Attends Religious Services: More than 4 times per year    Active Member of [employer] or Organizations: No    Attends Engineer, structural: More than 4 times per year    Marital Status: Patient declined  Recent Concern: Social Connections - Socially Isolated (03/09/2024)   Social Connection and  Isolation Panel [NHANES]    Frequency of Communication with Friends and Family: More than three times a week    Frequency of Social Gatherings with Friends and Family: More than three times  a week    Attends Religious Services: Never    Active Member of Clubs or Organizations: No    Attends Banker Meetings: Never    Marital Status: Widowed     Review of Systems: A 12 point ROS discussed and pertinent positives are indicated in the HPI above.  All other systems are negative.  Vital Signs: BP (!) 145/77 (BP Location: Right Arm)   Pulse 97   Temp 98.2 F (36.8 C)   Resp 20   Ht 5' (1.524 m)   Wt 182 lb 1.6 oz (82.6 kg)   SpO2 98%   BMI 35.56 kg/m   Advance Care Plan: The advanced care place/surrogate decision maker was discussed at the time of visit and the patient did not wish to discuss or was not able to name a surrogate decision maker or provide an advance care plan.  Physical Exam Constitutional:      General: She is not in acute distress.    Appearance: Normal appearance.  HENT:     Mouth/Throat:     Mouth: Mucous membranes are dry.  Cardiovascular:     Rate and Rhythm: Normal rate and regular rhythm.     Pulses: Normal pulses.     Heart sounds: Normal heart sounds.  Pulmonary:     Effort: Pulmonary effort is normal.     Breath sounds: Wheezing and rales present.     Comments: Wheezes and rales bilaterally. Abdominal:     General: Abdomen is flat. There is no distension.     Palpations: Abdomen is soft.     Tenderness: There is no abdominal tenderness.  Musculoskeletal:        General: Normal range of motion.     Cervical back: Normal range of motion.  Skin:    General: Skin is warm and dry.  Neurological:     Mental Status: She is alert and oriented to person, place, and time.  Psychiatric:        Mood and Affect: Mood normal.        Behavior: Behavior normal.        Thought Content: Thought content normal.        Judgment: Judgment normal.      Imaging: CT ABDOMEN PELVIS WO CONTRAST Result Date: 04/19/2024 CLINICAL DATA:  Abdominal pain, acute, nonlocalized EXAM: CT ABDOMEN AND PELVIS WITHOUT CONTRAST TECHNIQUE: Multidetector CT imaging of the abdomen and pelvis was performed following the standard protocol without IV contrast. RADIATION DOSE REDUCTION: This exam was performed according to the departmental dose-optimization program which includes automated exposure control, adjustment of the mA and/or kV according to patient size and/or use of iterative reconstruction technique. COMPARISON:  March 08, 2024 FINDINGS: Of note, the lack of intravenous contrast limits evaluation of the solid organ parenchyma and vascularity. Lower chest: Small bilateral pleural effusions, slightly larger on the left than the right, with bibasilar atelectasis. Coronary artery atherosclerosis. Hepatobiliary: Multiple small hepatic hypodensities have developed in the interim. For example, in the posterior right hepatic dome there is a 1.7 cm hypodense lesion (axial 14). In the medial right hepatic lobe there is a 1.8 cm lesion (axial 19). Anterior left hepatic lobe lesion measuring 1.8 cm (axial 15). Cholecystectomy. Decompression of the intrahepatic biliary tree with diffuse pneumobilia. Well-positioned metallic biliary duct stent in the common bile duct. No radiopaque choledocholithiasis. Pancreas: No mass or main ductal dilation. No peripancreatic inflammation or fluid collection. Spleen: Normal size. No mass. Adrenals/Urinary Tract: No adrenal masses. Unchanged  thick-walled cystic structure in the left kidney measuring 3.1 x 3 cm. No hydronephrosis or nephrolithiasis. The urinary bladder is completely decompressed. Stomach/Bowel: The stomach is decompressed without focal abnormality. No small bowel wall thickening or inflammation. No small bowel obstruction.Descending and sigmoid colonic diverticulosis. No changes of acute diverticulitis. Vascular/Lymphatic: No  aortic aneurysm. Diffuse aortoiliac atherosclerosis. No intraabdominal or pelvic lymphadenopathy. Reproductive: Hysterectomy. No concerning adnexal mass. No free pelvic fluid. Other: No pneumoperitoneum. Small volume ascites predominantly in the left paracolic gutter. Musculoskeletal: No acute fracture or destructive lesion. Unchanged peripherally calcified mass in the lateral right breast. Diffuse osteopenia. Multilevel degenerative disc disease of the spine. Large sclerotic lesion within the L2 vertebral body, unchanged. Severe pathologic compression fracture with retropulsion at L1 with retropulsion and moderate canal narrowing, unchanged. Multilevel degenerative disc disease of the spine. Diffuse anasarca. Infusion device in the right lower quadrant with a catheter extending into the thoracic spine, outside the field of view. IMPRESSION: 1. Interval development of multiple new hepatic hypodensities measuring up to 1.8 cm, worrisome for progressive metastatic disease. 2. Unchanged thick-walled left renal cystic lesion, consistent with renal cell carcinoma. 3. Small bilateral pleural effusions slightly larger on the right than the left. Small volume ascites, predominantly in the left paracolic gutter. Anasarca. This is likely related to the patient's volume status. 4. Unchanged bony metastases in L1 and L2 with severe pathologic compression fracture of L1 and moderate canal narrowing. Electronically Signed   By: Rance Burrows M.D.   On: 04/19/2024 21:13   DG C-Arm 1-60 Min-No Report Result Date: 04/15/2024 Fluoroscopy was utilized by the requesting physician.  No radiographic interpretation.    Labs:  CBC: Recent Labs    04/18/24 0516 04/18/24 1333 04/19/24 0412 04/21/24 0436  WBC 10.3 10.0 9.0 10.3  HGB 8.0* 7.8* 7.4* 7.3*  HCT 25.3* 24.2* 23.5* 23.1*  PLT 102* 94* 117* 183    COAGS: Recent Labs    03/08/24 1558 04/14/24 1912 04/14/24 1912 04/15/24 0711 04/15/24 2228 04/20/24 1937  04/21/24 0436 04/21/24 1438  INR 1.2 1.2  --   --   --  1.2  --   --   APTT  --  39*   < > 130* >200*  --  198* 133*   < > = values in this interval not displayed.    BMP: Recent Labs    04/17/24 1242 04/18/24 1333 04/19/24 0412 04/19/24 1720 04/21/24 0436  NA 140 139 137  --  139  K 3.0* 3.3* 3.4*  --  4.1  CL 112* 108 109  --  110  CO2 20* 21* 22  --  23  GLUCOSE 90 136* 117* 122* 82  BUN 24* 17 12  --  7*  CALCIUM  6.6* 6.8* 6.6*  --  7.1*  CREATININE 0.95 0.84 0.74  --  0.62  GFRNONAA >60 >60 >60  --  >60    LIVER FUNCTION TESTS: Recent Labs    04/17/24 1242 04/18/24 1333 04/19/24 0412 04/21/24 0436  BILITOT 5.0* 5.0* 4.2* 2.9*  AST 132* 105* 98* 98*  ALT 51* 47* 46* 47*  ALKPHOS 541* 539* 548* 564*  PROT 5.3* 5.6* 5.5* 5.7*  ALBUMIN  1.7* 1.6* 1.5* 1.6*    TUMOR MARKERS: No results for input(s): "AFPTM", "CEA", "CA199", "CHROMGRNA" in the last 8760 hours.  Assessment and Plan: Per Dr. Emmaline Haring progress note on 5/21: "I recommend liver biopsy to clarify tissue diagnosis.  Her metastatic breast cancer disease has been fairly stable over the past  few years.  Liver lesions are new since prior CT done 1 week ago.  Raising suspicion of liver abscess.  She is at high risk due to recent obstructive jaundice. If she truly has metastatic disease to liver, given limitation of treatment options due to her poor performance status, I think comfort measures/hospice is a reasonable option.  If she desires treatment, I will defer to [facility] oncology.  She will need to get molecular testing done to see if any targeted therapy is available. Patient agrees with the plan."  Last dose Plavix  (and Eliquis ) on 5/21 in AM. Patient will present for scheduled liver lesion biopsy in IR on 5/27.  Patient will have been NPO since midnight.  All labs and medications are currently within acceptable parameters.  No pertinent allergies.   Risks and benefits of liver lesion biopsy was discussed  with the patient and/or patient's family including, but not limited to bleeding, infection, damage to adjacent structures or low yield requiring additional tests.  All of the questions were answered and there is agreement to proceed.  Consent signed and in chart.     Thank you for allowing our service to participate in Lacresha  B Wildermuth 's care.  Electronically Signed: Lovena Rubinstein, PA-C   04/21/2024, 11:09 PM      I spent a total of 40 Minutes in face to face in clinical consultation, greater than 50% of which was counseling/coordinating care for liver lesions, with consideration for biopsy.

## 2024-04-22 DIAGNOSIS — R160 Hepatomegaly, not elsewhere classified: Secondary | ICD-10-CM | POA: Diagnosis not present

## 2024-04-22 DIAGNOSIS — K831 Obstruction of bile duct: Secondary | ICD-10-CM | POA: Diagnosis not present

## 2024-04-22 DIAGNOSIS — K750 Abscess of liver: Secondary | ICD-10-CM

## 2024-04-22 DIAGNOSIS — Z515 Encounter for palliative care: Secondary | ICD-10-CM | POA: Diagnosis not present

## 2024-04-22 DIAGNOSIS — K8309 Other cholangitis: Secondary | ICD-10-CM | POA: Diagnosis not present

## 2024-04-22 DIAGNOSIS — G893 Neoplasm related pain (acute) (chronic): Secondary | ICD-10-CM

## 2024-04-22 LAB — BASIC METABOLIC PANEL WITH GFR
Anion gap: 11 (ref 5–15)
BUN: 6 mg/dL — ABNORMAL LOW (ref 8–23)
CO2: 22 mmol/L (ref 22–32)
Calcium: 7.1 mg/dL — ABNORMAL LOW (ref 8.9–10.3)
Chloride: 104 mmol/L (ref 98–111)
Creatinine, Ser: 0.66 mg/dL (ref 0.44–1.00)
GFR, Estimated: >60 mL/min (ref >60)
Glucose, Bld: 97 mg/dL (ref 70–99)
Potassium: 3.8 mmol/L (ref 3.5–5.1)
Sodium: 137 mmol/L (ref 135–145)

## 2024-04-22 LAB — CBC
HCT: 21 % — ABNORMAL LOW (ref 36.0–46.0)
Hemoglobin: 6.8 g/dL — ABNORMAL LOW (ref 12.0–15.0)
MCH: 31.6 pg (ref 26.0–34.0)
MCHC: 32.4 g/dL (ref 30.0–36.0)
MCV: 97.7 fL (ref 80.0–100.0)
Platelets: 212 10*3/uL (ref 150–400)
RBC: 2.15 MIL/uL — ABNORMAL LOW (ref 3.87–5.11)
RDW: 20.7 % — ABNORMAL HIGH (ref 11.5–15.5)
WBC: 9.3 10*3/uL (ref 4.0–10.5)
nRBC: 0 % (ref 0.0–0.2)

## 2024-04-22 LAB — APTT: aPTT: 131 s — ABNORMAL HIGH (ref 24–36)

## 2024-04-22 LAB — THYROID PANEL WITH TSH
Free Thyroxine Index: 2.2 (ref 1.2–4.9)
T3 Uptake Ratio: 35 % (ref 24–39)
T4, Total: 6.4 ug/dL (ref 4.5–12.0)
TSH: 4.72 u[IU]/mL — ABNORMAL HIGH (ref 0.450–4.500)

## 2024-04-22 LAB — GLUCOSE, CAPILLARY: Glucose-Capillary: 73 mg/dL (ref 70–99)

## 2024-04-22 LAB — HEMOGLOBIN: Hemoglobin: 7.1 g/dL — ABNORMAL LOW (ref 12.0–15.0)

## 2024-04-22 LAB — FOLATE: Folate: 29 ng/mL (ref >5.9)

## 2024-04-22 LAB — VITAMIN B12: Vitamin B-12: 5230 pg/mL — ABNORMAL HIGH (ref 180–914)

## 2024-04-22 LAB — HEPARIN LEVEL (UNFRACTIONATED)
Heparin Unfractionated: 0.41 [IU]/mL (ref 0.30–0.70)
Heparin Unfractionated: 0.15 [IU]/mL — ABNORMAL LOW (ref 0.30–0.70)
Heparin Unfractionated: 0.22 [IU]/mL — ABNORMAL LOW (ref 0.30–0.70)

## 2024-04-22 MED ORDER — PAIN MANAGEMENT IT PUMP REFILL
1.0000 | INTRATHECAL | Status: DC
  Filled 2024-04-22: qty 1

## 2024-04-22 MED ORDER — SODIUM CHLORIDE 0.9% IV SOLUTION: Freq: Once | INTRAVENOUS | Status: DC

## 2024-04-22 MED ORDER — HEPARIN BOLUS VIA INFUSION
950.0000 [IU] | Freq: Once | INTRAVENOUS | Status: AC
  Administered 2024-04-22: 950 [IU] via INTRAVENOUS
  Filled 2024-04-22: qty 950

## 2024-04-22 MED ORDER — HEPARIN (PORCINE) 25000 UT/250ML-% IV SOLN
600.0000 [IU]/h | INTRAVENOUS | Status: DC
  Administered 2024-04-22: 300 [IU]/h via INTRAVENOUS
  Administered 2024-04-22: 500 [IU]/h via INTRAVENOUS
  Filled 2024-04-22: qty 250

## 2024-04-22 NOTE — Progress Notes (Signed)
 Nutrition Follow-up  DOCUMENTATION CODES:   Obesity unspecified  INTERVENTION:   -Continue regular diet -Continue MVI with minerals daily -Continue 500 mg vitamin C BID -Continue 220 mg zinc sulfate daily x 14 days -Continue Boost Breeze po TID, each supplement provides 250 kcal and 9 grams of protein  -Continue 30 ml Prosource Plus TID, each supplement provides 100 kcals and 15 grams protein  -Pt refusing NGT placement  NUTRITION DIAGNOSIS:   Inadequate oral intake related to poor appetite as evidenced by per patient/family report.  Ongoing  GOAL:   Patient will meet greater than or equal to 90% of their needs  Unmet  MONITOR:   PO intake, Supplement acceptance  REASON FOR ASSESSMENT:   Consult Assessment of nutrition requirement/status  ASSESSMENT:   Pt with medical history significant of hypertension, COPD, CAD, DES placement, diastolic CHF, A-fib on Eliquis , depression with anxiety, CKD-3B, PSVT, chronic pain, left bundle blockade, spinal stenosis, breast cancer metastasized to bone, left renal carcinoma, acute cholangitis and obstructive jaundice (s/p of ERCP), who presented to [facility] due to nausea, vomiting, right upper quadrant abdominal pain.  Reviewed I/O's: -585 ml x 24 hours and -3.5 L since admission  UOP: 700 ml x 24 hours   Pt unavailable at time of visit. Attempted to speak with pt via call to hospital room phone, however, unable to reach.   Palliative care following for goals of care discussions. Per palliative care and MD, pt has refused feeding tube and is a poor candidate for one.   Pt remains with very poor oral intake. Pt is taking some supplements, which is an improvement since last visit. There is little that RD can further offer from a nutrition standpoint, as pt has refused enteral nutrition support and TPN is contraindicated due to functional GI tract.   Reviewed wt hx; pt has experienced a 5.6% wt loss over the past week, which is  significant for time frame.   Per IR, plan for liver lesion biopsy as an outpatient.   Medications reviewed and include augmentin, vitamin C, cymbalta , mucinex , ferrous sulfate , protonix , miralax , senokot, and zinc sulfate.   Labs reviewed: CBGS: 64-123.   Diet Order:   Diet Order             Diet regular Fluid consistency: Thin  Diet effective now                   EDUCATION NEEDS:   No education needs have been identified at this time  Skin:  Skin Assessment: Skin Integrity Issues: Skin Integrity Issues:: Stage II, Incisions Stage II: sacrum Incisions: lt hip (draining)  Last BM:  04/21/24 (type 7)  Height:   Ht Readings from Last 1 Encounters:  04/14/24 5' (1.524 m)    Weight:   Wt Readings from Last 1 Encounters:  04/21/24 82.6 kg    Ideal Body Weight:  45.5 kg  BMI:  Body mass index is 35.56 kg/m.  Estimated Nutritional Needs:   Kcal:  1600-1800  Protein:  80-95 grams  Fluid:  1.6-1.8 L    Herschel Lords, RD, LDN, CDCES Registered Dietitian III Certified Diabetes Care and Education Specialist If unable to reach this RD, please use "RD Inpatient" group chat on secure chat between hours of 8am-4 pm daily

## 2024-04-22 NOTE — Progress Notes (Signed)
 PROGRESS NOTE    Stacey  B Venice [name]  XLK:[id number] DOB: May 17, 1946 DOA: 04/14/2024 PCP: [name], MD    Brief Narrative:  78 y.o. female with medical history significant of hypertension, COPD, CAD, DES placement, diastolic CHF, A-fib on Eliquis , depression with anxiety, CKD-3B, PSVT, chronic pain, left bundle blockade, spinal stenosis, breast cancer metastasized to bone, left renal carcinoma, acute cholangitis and obstructive jaundice (s/p of ERCP), who presented to [facility] due to nausea, vomiting, right upper quadrant abdominal pain. Pt was accepted for transferring from [facility] per Dr. Isom Marin of [facility] ED on 5/13, arrived today.     Patient was recently hospitalized to [facility] on 4/9 - 4/18 due to acute cholangitis and obstructive jaundice.  Patient is s/p of ERCP, found to have duodenal ulcer, duodenal and sphincter stenosis.  Stent was placed and the purulent discharge was removed.    Patient states that she has nausea, multiple episodes of nonbilious nonbloody vomiting and abdominal pain in the past several days.  No fever or chills.  Abdominal pain is located in the upper abdomen, constant, mild to moderate, aching, nonradiating.  No diarrhea.  No fever or chills.  She states she had some chest discomfort yesterday, which has resolved, currently no active chest pain.  She has mild dry cough, mild SOB due to COPD.  No symptoms of UTI.   CTA of abdomen/pelvis done in [facility] showed severe intra- and extrahepatic biliary ductal dilation.  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  Common bile duct diffuse mucosa hyperenhancement, nonspecific, but can be seen with ascending cholangitis.  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  Osseous metastases not significantly changed.   Liver function with ALP 895, AST 135, ALT 41, total bilirubin 7.1, direct bilirubin 6.09.  Renal function stable, WBC 7.2, INR 1.23.  Temperature normal,  initial blood pressure low with SBP in 80s, which improved to 109/63 after 30 cc/kg normal saline resuscitation.  Heart rate 100, RR 15, oxygen  saturation 99% on room air.  Chest x-ray negative for infiltration. Pt was given Zosyn  in ED.   Dr. Isom Marin of ED in [facility] consulted Dr. Bridgett Camps of GI who reached to Dr. Ole Berkeley of [facility] GI. Dr. Ole Berkeley agreed to see pt after arrival to [facility]. Pt is accepted to PCU as inpt.  5/16: ERCP today. 5/20: Patient remains weak and fatigued.  Persistent hypoglycemia due to poor oral intake.  RD engaged for comment today.  Assessment & Plan:   Principal Problem:   Ascending cholangitis Active Problems:   Biliary obstruction   Atrial fibrillation, chronic (HCC)   CAD (coronary artery disease)   HLD (hyperlipidemia)   Chronic diastolic CHF (congestive heart failure) (HCC)   Chronic kidney disease, stage 3b (HCC)   CAD in native artery   COPD (chronic obstructive pulmonary disease) (HCC)   HTN (hypertension)   Thrombocytopenia (HCC)   Anxiety and depression   Bipolar 2 disorder, major depressive episode (HCC)   Obesity, Class II, BMI 35-39.9   Metastasis to bone [facility])   Liver lesion   Biliary stricture  Ascending cholangitis and biliary obstruction:  Patient has WBC 23.6, no fever, clinically does not seem to have sepsis. GI consulted on admission. 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 Status post ERCP for ascending cholangitis.  Fully covered metal stent placed.  Bilirubin and LFTs downtrending.    Plan: Gi has signed off, per dr. Ole Berkeley this stent is permanent Continue zosyn ,  augmentin at discharge  Recurrent hypoglycemia Poor p.o. intake Hypoalbuminemia Suspected malnutrition Patient's oral intake has been negligent in the setting of recent biliary stent placement.  This is likely in the setting of significant metastatic disease. Plan: RD consultation Poor candidate for feeding tube, which patient declines anyway  Atrial fibrillation,  chronic (HCC Histsory PE:  Eliquis  resumed 5/19, now on heparin    CAD (coronary artery disease): Patent with no chest pain Years since last stent placed, on plavix  per cardiology. Will hold that   HLD (hyperlipidemia) Lipitor  hold   Chronic diastolic CHF (congestive heart failure) (HCC): 2D echo 03/25/21 showed EF of 50-55% with grade 1 diastolic dysfunction.  BNP is elevated 406, but patient does not have leg edema or JVD.  Does not seem to have CHF exacerbation. No indication for diuretics.  Continue to monitor volume status   COPD (chronic obstructive pulmonary disease) (HCC): Stable Continue as needed bronchodilators   HTN (hypertension) Continue home Norvasc , Cozaar , Lopressor    Thrombocytopenia (HCC):   Appears mild and chronic.  No bleeding noted. Monitor carefully while on anticoagulation and antiplatelet   Anxiety and depression and bipolar 2 disorder, major depressive episode (HCC) Continue home Cymbalta   Obesity, Class II, BMI 35-39.9: Body weight 87.5 kg, BMI 37.69 Encouraged weight loss and diet.  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 Metastatic breast CA Hepatic hypodensitieis as below  Hepatic hypodensities New, mets vs abscess - plan for outpt IR biopsy next week after apixaban /plavix  wash-out  Anemia Normocytic, has drifted from 7s to 6.8 today, no bleeding - repeat hgb this afternoon, transfuse if needed - monitor for bleeding, is on heparin  - check b12, folate  DVT prophylaxis: heparin  IV Code Status: DNR Family Communication: son Baker Bon telephonically Disposition Plan: Status is: Inpatient Remains inpatient appropriate because: discharge planning underway   Level of care: Progressive  Consultants:  GI  Procedures:  ERCP  Antimicrobials: Zosyn    Subjective: Seen and examined.  Stable abdominal pain. No other complaints.  Objective: Vitals:   04/21/24 2000 04/22/24 0026 04/22/24 0415 04/22/24  0902  BP: (!) 145/77 (!) 152/72 138/72 (!) 134/55  Pulse: 97 95 92 (!) 104  Resp: 20 20 20    Temp: 98.2 F (36.8 C) 98.2 F (36.8 C) 97.8 F (36.6 C) 98.4 F (36.9 C)  TempSrc:   Oral Oral  SpO2: 98% 96% 96% 100%  Weight:      Height:        Intake/Output Summary (Last 24 hours) at 04/22/2024 1120 Last data filed at 04/22/2024 0415 Gross per 24 hour  Intake 115.03 ml  Output 700 ml  Net -584.97 ml   Filed Weights   04/19/24 0415 04/20/24 0500 04/21/24 0500  Weight: 85.1 kg 85.9 kg 82.6 kg    Examination:  General exam: Appears frail, fatigued, chronically ill Respiratory system: Poor respiratory effort.  Lungs clear.  Room air Cardiovascular system: S1-S2, RRR, no murmurs, no pedal edema Gastrointestinal system: Obese, nondistended, soft Central nervous system: moving all 4, alert Extremities: LE edema, decreased muscle tone Skin: bruising extremities Psychiatry: Judgement and insight appear impaired. Mood & affect flattened.     Data Reviewed: I have personally reviewed following labs and imaging studies  CBC: Recent Labs  Lab 04/16/24 0928 04/17/24 0302 04/18/24 0516 04/18/24 1333 04/19/24 0412 04/21/24 0436 04/22/24 0305  WBC 15.6*   < > 10.3 10.0 9.0 10.3 9.3  NEUTROABS 13.4*  --  7.7  --  6.6  --   --  HGB 8.5*   < > 8.0* 7.8* 7.4* 7.3* 6.8*  HCT 25.8*   < > 25.3* 24.2* 23.5* 23.1* 21.0*  MCV 94.9   < > 96.9 96.8 98.3 99.1 97.7  PLT 85*   < > 102* 94* 117* 183 212   < > = values in this interval not displayed.   Basic Metabolic Panel: Recent Labs  Lab 04/16/24 0626 04/16/24 0928 04/16/24 0928 04/17/24 1242 04/18/24 1333 04/19/24 0412 04/19/24 1720 04/21/24 0436  NA  --  136  --  140 139 137  --  139  K  --  3.3*  --  3.0* 3.3* 3.4*  --  4.1  CL  --  107  --  112* 108 109  --  110  CO2  --  21*  --  20* 21* 22  --  23  GLUCOSE  --  124*   < > 90 136* 117* 122* 82  BUN  --  29*  --  24* 17 12  --  7*  CREATININE  --  1.05*  --  0.95 0.84  0.74  --  0.62  CALCIUM   --  6.8*  --  6.6* 6.8* 6.6*  --  7.1*  MG 2.0  --   --   --   --   --   --   --    < > = values in this interval not displayed.   GFR: Estimated Creatinine Clearance: 56.1 mL/min (by C-G formula based on SCr of 0.62 mg/dL). Liver Function Tests: Recent Labs  Lab 04/16/24 0928 04/17/24 1242 04/18/24 1333 04/19/24 0412 04/21/24 0436  AST 163* 132* 105* 98* 98*  ALT 59* 51* 47* 46* 47*  ALKPHOS 588* 541* 539* 548* 564*  BILITOT 5.6* 5.0* 5.0* 4.2* 2.9*  PROT 6.0* 5.3* 5.6* 5.5* 5.7*  ALBUMIN  1.9* 1.7* 1.6* 1.5* 1.6*   Recent Labs  Lab 04/16/24 0928  LIPASE 87*   No results for input(s): "AMMONIA" in the last 168 hours. Coagulation Profile: Recent Labs  Lab 04/20/24 1937  INR 1.2   Cardiac Enzymes: No results for input(s): "CKTOTAL", "CKMB", "CKMBINDEX", "TROPONINI" in the last 168 hours. BNP (last 3 results) No results for input(s): "PROBNP" in the last 8760 hours. HbA1C: No results for input(s): "HGBA1C" in the last 72 hours. CBG: Recent Labs  Lab 04/20/24 0838 04/20/24 1108 04/20/24 1632 04/20/24 1725 04/22/24 0932  GLUCAP 122* 123* 64* 92 73   Lipid Profile: No results for input(s): "CHOL", "HDL", "LDLCALC", "TRIG", "CHOLHDL", "LDLDIRECT" in the last 72 hours. Thyroid  Function Tests: No results for input(s): "TSH", "T4TOTAL", "FREET4", "T3FREE", "THYROIDAB" in the last 72 hours. Anemia Panel: No results for input(s): "VITAMINB12", "FOLATE", "FERRITIN", "TIBC", "IRON", "RETICCTPCT" in the last 72 hours. Sepsis Labs: No results for input(s): "PROCALCITON", "LATICACIDVEN" in the last 168 hours.  Recent Results (from the past 240 hours)  Culture, blood (Routine X 2) w Reflex to ID Panel     Status: None   Collection Time: 04/14/24  7:12 PM   Specimen: BLOOD  Result Value Ref Range Status   Specimen Description BLOOD BLOOD LEFT HAND  Final   Special Requests   Final    BOTTLES DRAWN AEROBIC AND ANAEROBIC Blood Culture adequate  volume   Culture   Final    NO GROWTH 5 DAYS Performed at [facility], [address] [place], [place] [address]    Report Status 04/19/2024 FINAL  Final  Culture, blood (Routine X 2) w  Reflex to ID Panel     Status: None   Collection Time: 04/14/24  7:13 PM   Specimen: BLOOD  Result Value Ref Range Status   Specimen Description BLOOD BLOOD LEFT ARM  Final   Special Requests   Final    BOTTLES DRAWN AEROBIC AND ANAEROBIC Blood Culture adequate volume   Culture   Final    NO GROWTH 5 DAYS Performed at [facility], [address] [place], [place] [address]    Report Status 04/19/2024 FINAL  Final  MRSA Next Gen by PCR, Nasal     Status: Abnormal   Collection Time: 04/15/24  5:40 AM   Specimen: Nasal Mucosa; Nasal Swab  Result Value Ref Range Status   MRSA by PCR Next Gen DETECTED (A) NOT DETECTED Final    Comment: RESULT CALLED TO, READ BACK BY AND VERIFIED WITH: Diallo, Boubacar P, RN 04/15/24 0719 MW (NOTE) The GeneXpert MRSA Assay (FDA approved for NASAL specimens only), is one component of a comprehensive MRSA colonization surveillance program. It is not intended to diagnose MRSA infection nor to guide or monitor treatment for MRSA infections. Test performance is not FDA approved in patients less than 48 years old. Performed at [facility], [address], [place], [place] [address]          Radiology Studies: No results found.        Scheduled Meds:  (feeding supplement) PROSource Plus  30 mL Oral TID BM   amLODipine   5 mg Oral Daily   amoxicillin-clavulanate  1 tablet Oral Q12H   vitamin C  500 mg Oral BID   Chlorhexidine  Gluconate Cloth  6 each Topical Daily   diclofenac  Sodium  4 g Topical QID   DULoxetine   30 mg Oral Daily   feeding supplement  1 Container Oral TID BM   ferrous sulfate   325 mg Oral TID WC   guaiFENesin   600 mg Oral BID   losartan   100 mg Oral Daily   metoprolol  succinate  75 mg Oral Daily    multivitamin with minerals  1 tablet Oral Daily   mupirocin  ointment  1 Application Nasal BID   pantoprazole   40 mg Oral BID   polyethylene glycol  17 g Oral Daily   senna-docusate  1 tablet Oral BID   zinc sulfate (50mg  elemental zinc)  220 mg Oral Daily   Continuous Infusions:  heparin  300 Units/hr (04/22/24 0431)     LOS: 8 days     Raymonde Calico, MD [facility]   If 7PM-7AM, please contact night-coverage  04/22/2024, 11:20 AM

## 2024-04-22 NOTE — Plan of Care (Signed)
  Problem: Education: Goal: Knowledge of General Education information will improve Description: Including pain rating scale, medication(s)/side effects and non-pharmacologic comfort measures Outcome: Progressing   Problem: Clinical Measurements: Goal: Ability to maintain clinical measurements within normal limits will improve Outcome: Progressing   Problem: Clinical Measurements: Goal: Will remain free from infection Outcome: Progressing   Problem: Clinical Measurements: Goal: Diagnostic test results will improve Outcome: Progressing   Problem: Clinical Measurements: Goal: Respiratory complications will improve Problem: Pain Managment: Goal: General experience of comfort will improve and/or be controlled Outcome: Progressing   Problem: Safety: Goal: Ability to remain free from injury will improve Outcome: Progressing   Problem: Skin Integrity: Goal: Risk for impaired skin integrity will decrease Outcome: Progressing  Plan of care ongoing see MAR see flowsheet   Outcome: Progressing   Problem: Activity: Goal: Risk for activity intolerance will decrease Outcome: Progressing   Problem: Nutrition: Goal: Adequate nutrition will be maintained Outcome: Progressing

## 2024-04-22 NOTE — Progress Notes (Signed)
 PHARMACY - ANTICOAGULATION CONSULT NOTE  Pharmacy Consult for heparin  Indication: atrial fibrillation  Allergies  Allergen Reactions   Brilinta  [Ticagrelor ] Diarrhea and Nausea Only    Nausea and severe diarrhea- general weakness. Patient says does not want to take again   Budesonide  Palpitations   Albuterol  Other (See Comments)    Heart racing     Patient Measurements: Height: 5' (152.4 cm) Weight: 82.6 kg (182 lb 1.6 oz) IBW/kg (Calculated) : 45.5 HEPARIN  DW (KG): 66.1  Vital Signs: Temp: 98.2 F (36.8 C) (05/23 0026) Temp Source: Oral (05/22 1609) BP: 152/72 (05/23 0026) Pulse Rate: 95 (05/23 0026)  Labs: Recent Labs    04/19/24 0412 04/20/24 1937 04/21/24 0436 04/21/24 1438 04/22/24 0305  HGB 7.4*  --  7.3*  --  6.8*  HCT 23.5*  --  23.1*  --  21.0*  PLT 117*  --  183  --  212  APTT  --   --  198* 133* 131*  LABPROT  --  15.5*  --   --   --   INR  --  1.2  --   --   --   HEPARINUNFRC  --   --  0.87*  --  0.41  CREATININE 0.74  --  0.62  --   --     Estimated Creatinine Clearance: 56.1 mL/min (by C-G formula based on SCr of 0.62 mg/dL).   Medical History: Past Medical History:  Diagnosis Date   CAD in native artery    a. DES to ramus 2005 with late stent thrombosis 2006 tx with PTCA. 12/18 PCI/DES x1 to mRCA, EF 50-55%   CHF (congestive heart failure) (HCC)    Chronic pain    COPD (chronic obstructive pulmonary disease) (HCC)    Hyperlipidemia    Hypertension    Left bundle branch block    Lymphedema    Metastatic breast cancer    a. to bone.   MI (myocardial infarction) (HCC)    Mild aortic stenosis 10/2017   Morbid obesity (HCC)    PAF (paroxysmal atrial fibrillation) (HCC)    PSVT (paroxysmal supraventricular tachycardia) (HCC)    a. per [facility] notes, seen on event monitor in 2014.   Pulmonary nodules    Assessment: 78 y/o female presenting with right upper quadrant abdominal pain with nausea and vomiting. PMH significant for hypertension,  COPD, CAD, DES placement, diastolic CHF, A-fib on Eliquis , depression with anxiety, CKD-3B, PSVT, chronic pain, breast cancer metastasized to bone, left renal carcinoma, acute cholangitis and obstructive jaundice (s/p of ERCP). Pharmacy has been consulted to transition to heparin  infusion.  Last dose of apixaban : 2.5 mg on 5/21 at 0821 Baseline labs: hgb 7.4, plt 117  5/22 @ 0436:  aPTT = 198,  HL = 0.87 5/22 @ 1438: aPTT = 133 5/23 @  0305: aPTT = 131,  HL = 0.41  Goal of Therapy:  Heparin  level 0.3-0.7 units/ml aPTT 66-102 seconds Monitor platelets by anticoagulation protocol: Yes   Plan:  5/23 @ 0305:  aPTT = 131, HL = 0.41 - aPTT SUPRAtherapeuitc,  HL therapeutic - Will hold heparin  drip for 1 hr and restart @ 300 units/hr  - will recheck aPTT, HL 8 hrs after restart - Monitor aPTT levels until correlating with heparin  levels - Monitor CBC and signs/symptoms of bleeding  Thank you for involving pharmacy in this patient's care.   Alvenia Job, PharmD Clinical Pharmacist 04/22/2024 3:34 AM

## 2024-04-22 NOTE — Progress Notes (Signed)
  BRIEF IR NOTE:  Per Dr. Sari Cunning, patient has opted for discharge, and will be scheduled as outpatient for liver lesion biopsy procedure. Inpatient liver lesion biopsy was canceled.    Electronically Signed: Lovena Rubinstein, PA-C 04/22/2024, 9:16 AM

## 2024-04-22 NOTE — Progress Notes (Signed)
 PHARMACY - ANTICOAGULATION CONSULT NOTE  Pharmacy Consult for heparin  Indication: atrial fibrillation  Allergies  Allergen Reactions   Brilinta  [Ticagrelor ] Diarrhea and Nausea Only    Nausea and severe diarrhea- general weakness. Patient says does not want to take again   Budesonide  Palpitations   Albuterol  Other (See Comments)    Heart racing     Patient Measurements: Height: 5' (152.4 cm) Weight: 82.6 kg (182 lb 1.6 oz) IBW/kg (Calculated) : 45.5 HEPARIN  DW (KG): 66.1  Vital Signs: Temp: 98.3 F (36.8 C) (05/23 1945) Temp Source: Oral (05/23 1945) BP: 151/61 (05/23 1945) Pulse Rate: 93 (05/23 1945)  Labs: Recent Labs    04/20/24 1937 04/21/24 0436 04/21/24 0436 04/21/24 1438 04/22/24 0305 04/22/24 1200 04/22/24 2102  HGB  --  7.3*   < >  --  6.8* 7.1*  --   HCT  --  23.1*  --   --  21.0*  --   --   PLT  --  183  --   --  212  --   --   APTT  --  198*  --  133* 131*  --   --   LABPROT 15.5*  --   --   --   --   --   --   INR 1.2  --   --   --   --   --   --   HEPARINUNFRC  --  0.87*   < >  --  0.41 0.15* 0.22*  CREATININE  --  0.62  --   --   --  0.66  --    < > = values in this interval not displayed.    Estimated Creatinine Clearance: 56.1 mL/min (by C-G formula based on SCr of 0.66 mg/dL).   Medical History: Past Medical History:  Diagnosis Date   CAD in native artery    a. DES to ramus 2005 with late stent thrombosis 2006 tx with PTCA. 12/18 PCI/DES x1 to mRCA, EF 50-55%   CHF (congestive heart failure) (HCC)    Chronic pain    COPD (chronic obstructive pulmonary disease) (HCC)    Hyperlipidemia    Hypertension    Left bundle branch block    Lymphedema    Metastatic breast cancer    a. to bone.   MI (myocardial infarction) (HCC)    Mild aortic stenosis 10/2017   Morbid obesity (HCC)    PAF (paroxysmal atrial fibrillation) (HCC)    PSVT (paroxysmal supraventricular tachycardia) (HCC)    a. per [facility] notes, seen on event monitor in 2014.    Pulmonary nodules    Assessment: 78 y/o female presenting with right upper quadrant abdominal pain with nausea and vomiting. PMH significant for hypertension, COPD, CAD, DES placement, diastolic CHF, A-fib on Eliquis , depression with anxiety, CKD-3B, PSVT, chronic pain, breast cancer metastasized to bone, left renal carcinoma, acute cholangitis and obstructive jaundice (s/p of ERCP). Pharmacy has been consulted to transition to heparin  infusion.  Last dose of apixaban : 2.5 mg on 5/21 at 0821  5/22 @ 0436:  aPTT = 198,  HL = 0.87 5/22 @ 1438: aPTT = 133 5/23 @  0305: aPTT = 131,  HL = 0.41 5/23 @   1200: HL 0.15  5/23 @   2102: HL 0.22  Goal of Therapy:  Heparin  level 0.3-0.7 units/ml Monitor platelets by anticoagulation protocol: Yes   Plan:  Heparin  level remains subtherapeutic. No issues with infusion noted. Give heparin  bolus of 950  units x1 Increase heparin  infusion rate to 600 units/hour Check heparin  level 8 hours after rate change Monitor daily heparin  levels while on heparin  infusion Monitor CBC and signs/symptoms of bleeding  Thank you for involving pharmacy in this patient's care.   Ananias Balls, PharmD Clinical Pharmacist 04/22/2024 9:46 PM

## 2024-04-22 NOTE — Progress Notes (Signed)
 Date of Admission:  04/14/2024      ID: Stacey  B [name] is a 78 y.o. female Principal Problem:   Ascending cholangitis Active Problems:   HTN (hypertension)   CAD (coronary artery disease)   HLD (hyperlipidemia)   COPD (chronic obstructive pulmonary disease) (HCC)   CAD in native artery   Anxiety and depression   Bipolar 2 disorder, major depressive episode (HCC)   Metastasis to bone (HCC)   Chronic diastolic CHF (congestive heart failure) (HCC)   Obesity, Class II, BMI 35-39.9   Biliary obstruction   Chronic kidney disease, stage 3b (HCC)   Atrial fibrillation, chronic (HCC)   Thrombocytopenia (HCC)   Liver lesion   Biliary stricture    Subjective: Pt has some pain rt shoulder  Medications:   (feeding supplement) PROSource Plus  30 mL Oral TID BM   amLODipine   5 mg Oral Daily   amoxicillin-clavulanate  1 tablet Oral Q12H   vitamin C  500 mg Oral BID   Chlorhexidine  Gluconate Cloth  6 each Topical Daily   diclofenac  Sodium  4 g Topical QID   DULoxetine   30 mg Oral Daily   feeding supplement  1 Container Oral TID BM   ferrous sulfate   325 mg Oral TID WC   guaiFENesin   600 mg Oral BID   losartan   100 mg Oral Daily   metoprolol  succinate  75 mg Oral Daily   multivitamin with minerals  1 tablet Oral Daily   mupirocin  ointment  1 Application Nasal BID   pantoprazole   40 mg Oral BID   polyethylene glycol  17 g Oral Daily   senna-docusate  1 tablet Oral BID   zinc sulfate (50mg  elemental zinc)  220 mg Oral Daily    Objective: Vital signs in last 24 hours: Patient Vitals for the past 24 hrs:  BP Temp Temp src Pulse Resp SpO2  04/22/24 0902 (!) 134/55 98.4 F (36.9 C) Oral (!) 104 -- 100 %  04/22/24 0415 138/72 97.8 F (36.6 C) Oral 92 20 96 %  04/22/24 0026 (!) 152/72 98.2 F (36.8 C) -- 95 20 96 %  04/21/24 2000 (!) 145/77 98.2 F (36.8 C) -- 97 20 98 %  04/21/24 1609 (!) 136/53 98.1 F (36.7 C) Oral 82 18 99 %  04/21/24 1154 (!) 128/95 98.3 F (36.8 C)  Oral 95 18 96 %      PHYSICAL EXAM:  General: Alert, cooperative, no distress, appears stated age.  Lungs: Clear to auscultation bilaterally. No Wheezing or Rhonchi. No rales. Heart: Regular rate and rhythm, no murmur, rub or gallop. Abdomen: Soft, some tenderness over the right upper quadrant Extremities: Violaceous fingertips      skin: No rashes or lesions. Or bruising Lymph: Cervical, supraclavicular normal. Neurologic: Paraparesis    Lab Results    Latest Ref Rng & Units 04/22/2024    3:05 AM 04/21/2024    4:36 AM 04/19/2024    4:12 AM  CBC  WBC 4.0 - 10.5 K/uL 9.3  10.3  9.0   Hemoglobin 12.0 - 15.0 g/dL 6.8  7.3  7.4   Hematocrit 36.0 - 46.0 % 21.0  23.1  23.5   Platelets 150 - 400 K/uL 212  183  117        Latest Ref Rng & Units 04/21/2024    4:36 AM 04/19/2024    5:20 PM 04/19/2024    4:12 AM  CMP  Glucose 70 - 99 mg/dL 82  161  096  BUN 8 - 23 mg/dL 7   12   Creatinine 6.96 - 1.00 mg/dL 2.95   2.84   Sodium 132 - 145 mmol/L 139   137   Potassium 3.5 - 5.1 mmol/L 4.1   3.4   Chloride 98 - 111 mmol/L 110   109   CO2 22 - 32 mmol/L 23   22   Calcium  8.9 - 10.3 mg/dL 7.1   6.6   Total Protein 6.5 - 8.1 g/dL 5.7   5.5   Total Bilirubin 0.0 - 1.2 mg/dL 2.9   4.2   Alkaline Phos 38 - 126 U/L 564   548   AST 15 - 41 U/L 98   98   ALT 0 - 44 U/L 47   46       Microbiology: Blood culture no growth     Assessment/Plan: Obstructive jaundice secondary to biliary stricture.  Unclear the etiology of the stricture Status post stent exchange now has a metal stent High alkaline phosphatase and high bilirubin improving  Ascending cholangitis question She has multiple hypodense lesions in the liver.  Mets versus abscesses risk for abscess is high because of the intrahepatic biliary dilated patient due to obstruction A liver biopsy would be helpful to send for culture and pathology.  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  Discussed the management with the patient and the hospitalist. Discussed with her son and daughter on the phone  ID will not see her this weekend Call if needed

## 2024-04-22 NOTE — Progress Notes (Signed)
 Dr. Sari Cunning notified that Stacey [name]  is refusing her blood transfusion at this time. She says " I am not a Jehovah's witness but I am scared". She wants to recheck her levels tomorrow and then decide on whether she wants the transfusion or not.

## 2024-04-22 NOTE — Progress Notes (Signed)
 Daily Progress Note   Patient Name: Stacey [name]  SHINIKA ESTELLE       Date: 04/22/2024 DOB: 1946/08/06  Age: 78 y.o. MRN#: [id number] Attending Physician: Janeane Mealy, MD Primary Care Physician: [name], MD Admit Date: 04/14/2024  Reason for Consultation/Follow-up: Establishing goals of care  HPI/Brief Hospital Review: 78 y.o. 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  Stent was placed and purulent discharge was removed.   CTA of abdomen/pelvis completed at [facility] revealed severe intra and extrahepatic biliary ductal dilation, compatible with distal common bile duct stricture which may be benign or malignant.  Additionally, common bile duct diffuse mucosal hyperenhancement that was nonspecific but seen with ascending cholangitis.  Left renal presumed renal cell carcinoma with slightly increased in size and osseous metastases was not significantly changed.   5/20, CT of abdomen and pelvis without contract revealed: 1. Interval development of multiple new hepatic hypodensities measuring up to 1.8 cm, worrisome for progressive  metastatic disease. 2. Unchanged thick-walled left renal cystic lesion, consistent with renal cell carcinoma. 3. Small bilateral pleural effusions slightly larger on the right than the left. Small volume ascites, predominantly in the left paracolic gutter. Anasarca. This is likely related to the patient's volume status. 4. Unchanged bony metastases in L1 and L2 with severe pathologic compression fracture of L1 and moderate canal narrowing.   PMT was consulted to support patient and family with goals of care discussions.   Subjective: Extensive chart review has been completed prior to meeting patient including labs, vital signs, imaging, progress notes, orders, and available advanced directive documents from current and previous encounters.    Visited bedside with Ms. Vandruff, she is awake awake, alert, oriented and able to engage in conversation.  Sister, son and daughter at bedside during  time of visit.  Son can recall previous conversations with PMT provider earlier in the week.  Ms. Lindseth shares she is experiencing pain in her shoulder and breast which is chronic pain from her, she denies an increase in acute pain.  Reviewed MAR and she recently received dose of Dilaudid .  Per review of care everywhere we discussed her pain management is closely followed by [facility] and that she is scheduled for a refill of her ITP later next month.  She and son share at home her pain is well-managed with pump and p.o. Dilaudid .  They are requesting she be placed on her home regimen prior to discharge.  Ms. Bjorn confirms her plan and wishes remain to return home and continue seeking aggressive treatment and interventions.  Son and daughter voiced their agreement with this plan and again refer to Ms. Sow as a Visual merchandiser."  Sister voices concern about constipation Ms. Schmall experienced prior to admission although during hospital stay she has struggled with multiple liquid stools.  We discussed a possible  regimen for constipation if symptoms return but include daily MiraLAX , Senokot and suppositories if needed.  Communicated with nursing staff, primary team, and pharmacy team regarding ITP.  MAR updated by pharmacy team to reflect presence of ITP.  Answered and addressed all questions and concerns.  PMT to continue to follow for ongoing needs and support.  Objective:  Physical Exam Constitutional:      General: She is not in acute distress.    Appearance: She is obese. She is ill-appearing.  Pulmonary:     Effort: Pulmonary effort is normal. No respiratory distress.  Skin:    General: Skin is warm and dry.  Neurological:     Mental Status: She is alert and oriented to person, place, and time.     Motor: Weakness present.             Vital Signs: BP (!) 121/53 (BP Location: Right Wrist)   Pulse 97   Temp 98.7 F (37.1 C) (Oral)   Resp 20   Ht 5' (1.524 m)   Wt 82.6 kg   SpO2 100%   BMI 35.56 kg/m  SpO2: SpO2: 100 % O2 Device: O2 Device: Room Air O2 Flow Rate: O2 Flow Rate (L/min): 4 L/min   Palliative Care Assessment & Plan   Assessment/Recommendation/Plan  Continue with current plan of care PMT to continue to follow for ongoing needs and support  Thank you for allowing the Palliative Medicine Team to assist in the care of this patient.  Total time: 50 minutes  Time spent includes: Detailed review of medical records (labs, imaging, vital signs), medically appropriate exam (mental status, respiratory, cardiac, skin), discussed with treatment team, counseling and educating patient, family and staff, documenting clinical information, medication management and coordination of care.  Isadore Marble, DNP, AGNP-C Palliative Medicine   Please contact Palliative Medicine Team phone at [phone] for questions and concerns.

## 2024-04-22 NOTE — Progress Notes (Signed)
 PHARMACY - ANTICOAGULATION CONSULT NOTE  Pharmacy Consult for heparin  Indication: atrial fibrillation  Allergies  Allergen Reactions   Brilinta  [Ticagrelor ] Diarrhea and Nausea Only    Nausea and severe diarrhea- general weakness. Patient says does not want to take again   Budesonide  Palpitations   Albuterol  Other (See Comments)    Heart racing     Patient Measurements: Height: 5' (152.4 cm) Weight: 82.6 kg (182 lb 1.6 oz) IBW/kg (Calculated) : 45.5 HEPARIN  DW (KG): 66.1  Vital Signs: Temp: 98.5 F (36.9 C) (05/23 1220) Temp Source: Oral (05/23 1220) BP: 131/50 (05/23 1220) Pulse Rate: 88 (05/23 1220)  Labs: Recent Labs    04/20/24 1937 04/21/24 0436 04/21/24 0436 04/21/24 1438 04/22/24 0305 04/22/24 1200  HGB  --  7.3*   < >  --  6.8* 7.1*  HCT  --  23.1*  --   --  21.0*  --   PLT  --  183  --   --  212  --   APTT  --  198*  --  133* 131*  --   LABPROT 15.5*  --   --   --   --   --   INR 1.2  --   --   --   --   --   HEPARINUNFRC  --  0.87*  --   --  0.41 0.15*  CREATININE  --  0.62  --   --   --   --    < > = values in this interval not displayed.    Estimated Creatinine Clearance: 56.1 mL/min (by C-G formula based on SCr of 0.62 mg/dL).   Medical History: Past Medical History:  Diagnosis Date   CAD in native artery    a. DES to ramus 2005 with late stent thrombosis 2006 tx with PTCA. 12/18 PCI/DES x1 to mRCA, EF 50-55%   CHF (congestive heart failure) (HCC)    Chronic pain    COPD (chronic obstructive pulmonary disease) (HCC)    Hyperlipidemia    Hypertension    Left bundle branch block    Lymphedema    Metastatic breast cancer    a. to bone.   MI (myocardial infarction) (HCC)    Mild aortic stenosis 10/2017   Morbid obesity (HCC)    PAF (paroxysmal atrial fibrillation) (HCC)    PSVT (paroxysmal supraventricular tachycardia) (HCC)    a. per [facility] notes, seen on event monitor in 2014.   Pulmonary nodules    Assessment: 78 y/o female  presenting with right upper quadrant abdominal pain with nausea and vomiting. PMH significant for hypertension, COPD, CAD, DES placement, diastolic CHF, A-fib on Eliquis , depression with anxiety, CKD-3B, PSVT, chronic pain, breast cancer metastasized to bone, left renal carcinoma, acute cholangitis and obstructive jaundice (s/p of ERCP). Pharmacy has been consulted to transition to heparin  infusion.  Last dose of apixaban : 2.5 mg on 5/21 at 0821  5/22 @ 0436:  aPTT = 198,  HL = 0.87 5/22 @ 1438: aPTT = 133 5/23 @  0305: aPTT = 131,  HL = 0.41 5/23 @   1200: HL 0.15   Goal of Therapy:  Heparin  level 0.3-0.7 units/ml Monitor platelets by anticoagulation protocol: Yes   Plan:  Heparin  level is subtherapeutic. Will increase heparin  infusion to 500 units/hr. Recheck heparin  level in 8 hours. CBC daily while on heparin . Switch to heparin  level monitoring, seems apixaban  has washed out.  Thank you for involving pharmacy in this patient's care.  Trinidad Funk, PharmD Clinical Pharmacist 04/22/2024 12:26 PM

## 2024-04-23 DIAGNOSIS — K831 Obstruction of bile duct: Secondary | ICD-10-CM | POA: Diagnosis not present

## 2024-04-23 DIAGNOSIS — K8309 Other cholangitis: Secondary | ICD-10-CM | POA: Diagnosis not present

## 2024-04-23 DIAGNOSIS — R160 Hepatomegaly, not elsewhere classified: Secondary | ICD-10-CM | POA: Diagnosis not present

## 2024-04-23 DIAGNOSIS — Z515 Encounter for palliative care: Secondary | ICD-10-CM | POA: Diagnosis not present

## 2024-04-23 LAB — CBC
HCT: 22.5 % — ABNORMAL LOW (ref 36.0–46.0)
Hemoglobin: 7.2 g/dL — ABNORMAL LOW (ref 12.0–15.0)
MCH: 31.9 pg (ref 26.0–34.0)
MCHC: 32 g/dL (ref 30.0–36.0)
MCV: 99.6 fL (ref 80.0–100.0)
Platelets: 243 10*3/uL (ref 150–400)
RBC: 2.26 MIL/uL — ABNORMAL LOW (ref 3.87–5.11)
RDW: 21.2 % — ABNORMAL HIGH (ref 11.5–15.5)
WBC: 7.6 10*3/uL (ref 4.0–10.5)
nRBC: 0 % (ref 0.0–0.2)

## 2024-04-23 LAB — BASIC METABOLIC PANEL WITH GFR
Anion gap: 8 (ref 5–15)
BUN: 6 mg/dL — ABNORMAL LOW (ref 8–23)
CO2: 22 mmol/L (ref 22–32)
Calcium: 7.3 mg/dL — ABNORMAL LOW (ref 8.9–10.3)
Chloride: 106 mmol/L (ref 98–111)
Creatinine, Ser: 0.63 mg/dL (ref 0.44–1.00)
GFR, Estimated: >60 mL/min (ref >60)
Glucose, Bld: 88 mg/dL (ref 70–99)
Potassium: 3.5 mmol/L (ref 3.5–5.1)
Sodium: 136 mmol/L (ref 135–145)

## 2024-04-23 LAB — HEPARIN LEVEL (UNFRACTIONATED): Heparin Unfractionated: 0.44 [IU]/mL (ref 0.30–0.70)

## 2024-04-23 MED ORDER — ENOXAPARIN SODIUM 100 MG/ML IJ SOSY
1.0000 mg/kg | PREFILLED_SYRINGE | Freq: Two times a day (BID) | INTRAMUSCULAR | Status: DC
  Administered 2024-04-23 – 2024-04-24 (×2): 82.5 mg via SUBCUTANEOUS
  Filled 2024-04-23 (×3): qty 1

## 2024-04-23 MED ORDER — AMOXICILLIN-POT CLAVULANATE 875-125 MG PO TABS: 1.0000 | ORAL_TABLET | Freq: Two times a day (BID) | ORAL | 0 refills | Status: AC

## 2024-04-23 MED ORDER — MEDIHONEY WOUND/BURN DRESSING EX PSTE: 1.0000 | PASTE | Freq: Every day | CUTANEOUS | Status: DC

## 2024-04-23 MED ORDER — ENOXAPARIN SODIUM 100 MG/ML IJ SOSY: 1.0000 mg/kg | PREFILLED_SYRINGE | Freq: Two times a day (BID) | INTRAMUSCULAR | 0 refills | Status: DC

## 2024-04-23 MED ORDER — ENOXAPARIN SODIUM 150 MG/ML IJ SOSY: 1.5000 mg/kg | PREFILLED_SYRINGE | INTRAMUSCULAR | Status: DC

## 2024-04-23 NOTE — TOC Progression Note (Signed)
 Transition of Care [facility]) - Progression Note    Patient Details  Name: Stacey  EMIRETH [name] MRN: [id number] Date of Birth: Oct 03, 1946  [facility] CM/SW Contact  Holland Lundborg, RN Phone Number: 04/23/2024, 4:30 PM  Clinical Narrative:     Attempted to set up transportation for patient to be discharged home.  Notified by [facility] that pick up time would be close to or after midnight.  Patient lives in [place], 40 minutes away, MD notified.  Will plan to keep patient tonight and discharge tomorrow morning.        Expected Discharge Plan and Services         Expected Discharge Date: 04/23/24                                     Social Determinants of Health (SDOH) Interventions SDOH Screenings   Food Insecurity: No Food Insecurity (04/14/2024)  Housing: Low Risk  (04/14/2024)  Transportation Needs: No Transportation Needs (04/14/2024)  Utilities: Not At Risk (04/14/2024)  Financial Resource Strain: High Risk (01/04/2024)   Received from [facility]  Physical Activity: Inactive (08/17/2023)   Received from [facility]  Social Connections: Unknown (04/14/2024)  Recent Concern: Social Connections - Socially Isolated (03/09/2024)  Stress: No Stress Concern Present (08/17/2023)   Received from [facility]  Tobacco Use: Medium Risk (04/15/2024)  Health Literacy: Inadequate Health Literacy (08/17/2023)   Received from [facility]    Readmission Risk Interventions    03/09/2024    8:15 AM  Readmission Risk Prevention Plan  Transportation Screening Complete  HRI or Home Care Consult Complete  Social Work Consult for Recovery Care Planning/Counseling Complete  Palliative Care Screening Complete  Medication Review Oceanographer) Complete

## 2024-04-23 NOTE — Progress Notes (Signed)
 Daily Progress Note   Patient Name: Stacey [name]  Stacey [name] [name]       Date: 04/23/2024 DOB: [birth year]  Age: 78 y.o. MRN#: [id number] Attending Physician: Janeane Mealy, MD Primary Care Physician: [name], MD Admit Date: 04/14/2024  Reason for Consultation/Follow-up: Establishing goals of care  HPI/Brief Hospital Review: 78 y.o. 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  Stent was placed and purulent discharge was removed.   CTA of abdomen/pelvis completed at [facility] revealed severe intra and extrahepatic biliary ductal dilation, compatible with distal common bile duct stricture which may be benign or malignant.  Additionally, common bile duct diffuse mucosal hyperenhancement that was nonspecific but seen with ascending cholangitis.  Left renal presumed renal cell carcinoma with slightly increased in size and osseous metastases was not significantly changed.   5/20, CT of abdomen and pelvis without contract revealed: 1. Interval development of multiple new hepatic hypodensities measuring up to 1.8 cm, worrisome for progressive  metastatic disease. 2. Unchanged thick-walled left renal cystic lesion, consistent with renal cell carcinoma. 3. Small bilateral pleural effusions slightly larger on the right than the left. Small volume ascites, predominantly in the left paracolic gutter. Anasarca. This is likely related to the patient's volume status. 4. Unchanged bony metastases in L1 and L2 with severe pathologic compression fracture of L1 and moderate canal narrowing.   PMT was consulted to support patient and family with goals of care discussions.  Subjective: Extensive chart review has been completed prior to meeting patient including labs, vital signs, imaging, progress notes, orders, and available advanced directive documents from current and previous encounters.    Visited with Stacey [name] [name] at her bedside. She is lying in bed, awake, alert and able to engage in conversations. She is eager to discharge home,  orders in place, awaiting transport. No family at bedside during time of visit.  Assessed symptoms. She reports improvement in her pain from yesterday, resting well and reports appetite is continuing to improve.  We discussed her goals and plan, she shares she is feeling more optimistic after speaking with other members of her medical team and they have shared she has options for possible treatment to explore. She is aware she needs follow up with her oncology team and pain management. Scheduled for liver biopsy possibly later this week.  We discuss the role of outpatient palliative care, she shares she has been followed by [facility] in the past, interested in continuing outpatient palliative but is now active with [facility]--notified TOC and order placed to resume outpatient palliative care.   Anticipating discharge today or tomorrow, PMT to step away from daily visits, will remain available for needs or support as they arise.  Objective:  Physical Exam Constitutional:      General: She is not  in acute distress.    Appearance: She is ill-appearing.  Pulmonary:     Effort: Pulmonary effort is normal. No respiratory distress.  Skin:    General: Skin is warm and dry.     Findings: Bruising present.  Neurological:     Mental Status: She is alert and oriented to person, place, and time.     Motor: Weakness present.  Psychiatric:        Mood and Affect: Mood normal.        Behavior: Behavior normal.        Thought Content: Thought content normal.             Vital Signs: BP (!) 141/62 (BP Location: Right Arm)   Pulse 95   Temp 98.2 F (36.8 C)   Resp 18   Ht 5' (1.524 m)   Wt 82.5 kg   SpO2 100%   BMI 35.52 kg/m  SpO2: SpO2: 100 % O2 Device: O2 Device: Room Air O2 Flow Rate: O2 Flow Rate (L/min): 4 L/min   Palliative Care Assessment & Plan   Assessment/Recommendation/Plan  Continue with current goals of care Resume outpatient palliative care at discharge  Thank you for allowing the Palliative Medicine Team to assist in the care of this patient.  Total time:  35 minutes  Time spent includes: Detailed review of medical records (labs, imaging, vital signs), medically appropriate exam (mental status, respiratory, cardiac, skin), discussed with treatment team, counseling and educating patient, family and staff, documenting clinical information, medication management and coordination of care.  Isadore Marble, DNP, AGNP-C Palliative Medicine   Please contact Palliative Medicine Team phone at [phone] for questions and concerns.

## 2024-04-23 NOTE — Progress Notes (Signed)
 PROGRESS NOTE    Stacey [name]  QMV:[id number] DOB: 08-05-1946 DOA: 04/14/2024 PCP: [name], MD    Brief Narrative:  78 y.o. female with medical history significant of hypertension, COPD, CAD, DES placement, diastolic CHF, A-fib on Eliquis , depression with anxiety, CKD-3B, PSVT, chronic pain, left bundle blockade, spinal stenosis, breast cancer metastasized to bone, left renal carcinoma, acute cholangitis and obstructive jaundice (s/p of ERCP), who presented to [facility] due to nausea, vomiting, right upper quadrant abdominal pain. Pt was accepted for transferring from [facility] per Dr. Isom Marin of [facility] ED on 5/13, arrived today.     Patient was recently hospitalized to [facility] on 4/9 - 4/18 due to acute cholangitis and obstructive jaundice.  Patient is s/p of ERCP, found to have duodenal ulcer, duodenal and sphincter stenosis.  Stent was placed and the purulent discharge was removed.    Patient states that she has nausea, multiple episodes of nonbilious nonbloody vomiting and abdominal pain in the past several days.  No fever or chills.  Abdominal pain is located in the upper abdomen, constant, mild to moderate, aching, nonradiating.  No diarrhea.  No fever or chills.  She states she had some chest discomfort yesterday, which has resolved, currently no active chest pain.  She has mild dry cough, mild SOB due to COPD.  No symptoms of UTI.   CTA of abdomen/pelvis done in [facility] showed severe intra- and extrahepatic biliary ductal dilation.  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  Common bile duct diffuse mucosa hyperenhancement, nonspecific, but can be seen with ascending cholangitis.  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  Osseous metastases not significantly changed.   Liver function with ALP 895, AST 135, ALT 41, total bilirubin 7.1, direct bilirubin 6.09.  Renal function stable, WBC 7.2, INR 1.23.  Temperature normal,  initial blood pressure low with SBP in 80s, which improved to 109/63 after 30 cc/kg normal saline resuscitation.  Heart rate 100, RR 15, oxygen  saturation 99% on room air.  Chest x-ray negative for infiltration. Pt was given Zosyn  in ED.   Dr. Isom Marin of ED in [facility] consulted Dr. Bridgett Camps of GI who reached to Dr. Ole Berkeley of [facility] GI. Dr. Ole Berkeley agreed to see pt after arrival to [facility]. Pt is accepted to PCU as inpt.  5/16: ERCP today. 5/20: Patient remains weak and fatigued.  Persistent hypoglycemia due to poor oral intake.  RD engaged for comment today.  Assessment & Plan:   Principal Problem:   Ascending cholangitis Active Problems:   Biliary obstruction   Atrial fibrillation, chronic (HCC)   CAD (coronary artery disease)   HLD (hyperlipidemia)   Chronic diastolic CHF (congestive heart failure) (HCC)   Chronic kidney disease, stage 3b (HCC)   CAD in native artery   COPD (chronic obstructive pulmonary disease) (HCC)   HTN (hypertension)   Thrombocytopenia (HCC)   Anxiety and depression   Bipolar 2 disorder, major depressive episode (HCC)   Obesity, Class II, BMI 35-39.9   Metastasis to bone [facility])   Liver lesion   Biliary stricture   Liver abscess  Ascending cholangitis and biliary obstruction:  Patient has WBC 23.6, no fever, clinically does not seem to have sepsis. GI consulted on admission. 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 Status post ERCP for ascending cholangitis.  Fully covered metal stent placed.  Bilirubin and LFTs downtrending.    Plan: Gi has signed off, per dr. Ole Berkeley this stent  is permanent Continue augmentin  Recurrent hypoglycemia Poor p.o. intake Hypoalbuminemia Suspected malnutrition Patient's oral intake has been negligent in the setting of recent biliary stent placement.  This is likely in the setting of significant metastatic disease. Plan: RD consultation Poor candidate for feeding tube, which patient declines anyway  Atrial fibrillation,  chronic (HCC Histsory PE:  Eliquis  resumed 5/19, now on heparin  transitioning to lovenox  for bridge   CAD (coronary artery disease): Patent with no chest pain Years since last stent placed, on plavix  per cardiology. On hold   HLD (hyperlipidemia) Lipitor  hold   Chronic diastolic CHF (congestive heart failure) (HCC): 2D echo 03/25/21 showed EF of 50-55% with grade 1 diastolic dysfunction.  BNP is elevated 406, but patient does not have leg edema or JVD.  Does not seem to have CHF exacerbation. No indication for diuretics.  Continue to monitor volume status   COPD (chronic obstructive pulmonary disease) (HCC): Stable Continue as needed bronchodilators   HTN (hypertension) Continue home Norvasc , Cozaar , Lopressor    Thrombocytopenia (HCC):   Appears mild and chronic.  No bleeding noted. Monitor carefully while on anticoagulation and antiplatelet   Anxiety and depression and bipolar 2 disorder, major depressive episode (HCC) Continue home Cymbalta   Obesity, Class II, BMI 35-39.9: Body weight 87.5 kg, BMI 37.69 Encouraged weight loss and diet.  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 B12/folate wnl. Declines transfusion  DVT prophylaxis: lovenox  Code Status: DNR Family Communication: daughter at bedside Disposition Plan: Status is: Inpatient Remains inpatient appropriate because: EMS can't take her home today   Level of care: Progressive  Consultants:  GI  Procedures:  ERCP  Antimicrobials: Zosyn    Subjective: Seen and examined.  Ate more food today, happy about that  Objective: Vitals:   04/23/24 0451 04/23/24 0755 04/23/24 1302 04/23/24 1512  BP: 131/61 (!) 141/63 (!) 153/50 (!) 141/62  Pulse: 84 92 86 95  Resp: 17 19 18  18   Temp: 98.4 F (36.9 C) 98.3 F (36.8 C) (!) 97.4 F (36.3 C) 98.2 F (36.8 C)  TempSrc: Oral     SpO2: 98% 98% 99% 100%  Weight: 82.5 kg     Height:        Intake/Output Summary (Last 24 hours) at 04/23/2024 1634 Last data filed at 04/23/2024 1500 Gross per 24 hour  Intake 846.53 ml  Output 700 ml  Net 146.53 ml   Filed Weights   04/20/24 0500 04/21/24 0500 04/23/24 0451  Weight: 85.9 kg 82.6 kg 82.5 kg    Examination:  General exam: Appears frail, fatigued, chronically ill Respiratory system: Poor respiratory effort.  Lungs clear.  Room air Cardiovascular system: S1-S2, RRR, no murmurs, no pedal edema Gastrointestinal system: Obese, nondistended, soft Central nervous system: moving all 4, alert Extremities: LE edema, decreased muscle tone Skin: bruising extremities Psychiatry: Judgement and insight appear impaired. Mood & affect flattened.     Data Reviewed: I have personally reviewed following labs and imaging studies  CBC: Recent Labs  Lab 04/18/24 0516 04/18/24 1333 04/19/24 0412 04/21/24 0436 04/22/24 0305 04/22/24 1200 04/23/24 0630  WBC 10.3 10.0 9.0 10.3 9.3  --  7.6  NEUTROABS 7.7  --  6.6  --   --   --   --   HGB 8.0* 7.8* 7.4* 7.3*  6.8* 7.1* 7.2*  HCT 25.3* 24.2* 23.5* 23.1* 21.0*  --  22.5*  MCV 96.9 96.8 98.3 99.1 97.7  --  99.6  PLT 102* 94* 117* 183 212  --  243   Basic Metabolic Panel: Recent Labs  Lab 04/18/24 1333 04/19/24 0412 04/19/24 1720 04/21/24 0436 04/22/24 1200 04/23/24 0630  NA 139 137  --  139 137 136  K 3.3* 3.4*  --  4.1 3.8 3.5  CL 108 109  --  110 104 106  CO2 21* 22  --  23 22 22   GLUCOSE 136* 117* 122* 82 97 88  BUN 17 12  --  7* 6* 6*  CREATININE 0.84 0.74  --  0.62 0.66 0.63  CALCIUM  6.8* 6.6*  --  7.1* 7.1* 7.3*   GFR: Estimated Creatinine Clearance: 56.1 mL/min (by C-G formula based on SCr of 0.63 mg/dL). Liver Function Tests: Recent Labs  Lab 04/17/24 1242 04/18/24 1333 04/19/24 0412  04/21/24 0436  AST 132* 105* 98* 98*  ALT 51* 47* 46* 47*  ALKPHOS 541* 539* 548* 564*  BILITOT 5.0* 5.0* 4.2* 2.9*  PROT 5.3* 5.6* 5.5* 5.7*  ALBUMIN  1.7* 1.6* 1.5* 1.6*   No results for input(s): "LIPASE", "AMYLASE" in the last 168 hours.  No results for input(s): "AMMONIA" in the last 168 hours. Coagulation Profile: Recent Labs  Lab 04/20/24 1937  INR 1.2   Cardiac Enzymes: No results for input(s): "CKTOTAL", "CKMB", "CKMBINDEX", "TROPONINI" in the last 168 hours. BNP (last 3 results) No results for input(s): "PROBNP" in the last 8760 hours. HbA1C: No results for input(s): "HGBA1C" in the last 72 hours. CBG: Recent Labs  Lab 04/20/24 0838 04/20/24 1108 04/20/24 1632 04/20/24 1725 04/22/24 0932  GLUCAP 122* 123* 64* 92 73   Lipid Profile: No results for input(s): "CHOL", "HDL", "LDLCALC", "TRIG", "CHOLHDL", "LDLDIRECT" in the last 72 hours. Thyroid  Function Tests: Recent Labs    04/21/24 0436  TSH 4.720*  T4TOTAL 6.4   Anemia Panel: Recent Labs    04/22/24 1200  VITAMINB12 5,230*  FOLATE 29.0   Sepsis Labs: No results for input(s): "PROCALCITON", "LATICACIDVEN" in the last 168 hours.  Recent Results (from the past 240 hours)  Culture, blood (Routine X 2) w Reflex to ID Panel     Status: None   Collection Time: 04/14/24  7:12 PM   Specimen: BLOOD  Result Value Ref Range Status   Specimen Description BLOOD BLOOD LEFT HAND  Final   Special Requests   Final    BOTTLES DRAWN AEROBIC AND ANAEROBIC Blood Culture adequate volume   Culture   Final    NO GROWTH 5 DAYS Performed at [facility], [address] [place], [place] [address]    Report Status 04/19/2024 FINAL  Final  Culture, blood (Routine X 2) w Reflex to ID Panel     Status: None   Collection Time: 04/14/24  7:13 PM   Specimen: BLOOD  Result Value Ref Range Status   Specimen Description BLOOD BLOOD LEFT ARM  Final   Special Requests   Final    BOTTLES DRAWN AEROBIC AND ANAEROBIC  Blood Culture adequate volume   Culture   Final    NO GROWTH 5 DAYS Performed at [facility], [address], [place], [place] [address]    Report Status 04/19/2024 FINAL  Final  MRSA Next Gen by PCR, Nasal     Status: Abnormal   Collection Time: 04/15/24  5:40 AM   Specimen: Nasal  Mucosa; Nasal Swab  Result Value Ref Range Status   MRSA by PCR Next Gen DETECTED (A) NOT DETECTED Final    Comment: RESULT CALLED TO, READ BACK BY AND VERIFIED WITH: Diallo, Boubacar P, RN 04/15/24 0719 MW (NOTE) The GeneXpert MRSA Assay (FDA approved for NASAL specimens only), is one component of a comprehensive MRSA colonization surveillance program. It is not intended to diagnose MRSA infection nor to guide or monitor treatment for MRSA infections. Test performance is not FDA approved in patients less than 33 years old. Performed at [facility], [address] [place], [place] [address]          Radiology Studies: No results found.        Scheduled Meds:  (feeding supplement) PROSource Plus  30 mL Oral TID BM   sodium chloride    Intravenous Once   amLODipine   5 mg Oral Daily   amoxicillin-clavulanate  1 tablet Oral Q12H   vitamin C  500 mg Oral BID   diclofenac  Sodium  4 g Topical QID   DULoxetine   30 mg Oral Daily   enoxaparin  (LOVENOX ) injection  1 mg/kg Subcutaneous Q12H   feeding supplement  1 Container Oral TID BM   ferrous sulfate   325 mg Oral TID WC   guaiFENesin   600 mg Oral BID   losartan   100 mg Oral Daily   metoprolol  succinate  75 mg Oral Daily   multivitamin with minerals  1 tablet Oral Daily   mupirocin  ointment  1 Application Nasal BID   pantoprazole   40 mg Oral BID   polyethylene glycol  17 g Oral Daily   senna-docusate  1 tablet Oral BID   zinc sulfate (50mg  elemental zinc)  220 mg Oral Daily   Continuous Infusions:  PAIN MANAGEMENT IT PUMP REFILL       LOS: 9 days     Raymonde Calico, MD [facility]   If 7PM-7AM, please  contact night-coverage  04/23/2024, 4:34 PM

## 2024-04-23 NOTE — Plan of Care (Signed)

## 2024-04-23 NOTE — Progress Notes (Signed)
 PHARMACY - ANTICOAGULATION CONSULT NOTE  Pharmacy Consult for heparin  Indication: atrial fibrillation  Allergies  Allergen Reactions   Brilinta  [Ticagrelor ] Diarrhea and Nausea Only    Nausea and severe diarrhea- general weakness. Patient says does not want to take again   Budesonide  Palpitations   Albuterol  Other (See Comments)    Heart racing     Patient Measurements: Height: 5' (152.4 cm) Weight: 82.5 kg (181 lb 14.1 oz) IBW/kg (Calculated) : 45.5 HEPARIN  DW (KG): 66.1  Vital Signs: Temp: 98.4 F (36.9 C) (05/24 0451) Temp Source: Oral (05/24 0451) BP: 131/61 (05/24 0451) Pulse Rate: 84 (05/24 0451)  Labs: Recent Labs    04/20/24 1937 04/21/24 0436 04/21/24 0436 04/21/24 1438 04/22/24 0305 04/22/24 1200 04/22/24 2102 04/23/24 0630  HGB  --  7.3*   < >  --  6.8* 7.1*  --  7.2*  HCT  --  23.1*  --   --  21.0*  --   --  22.5*  PLT  --  183  --   --  212  --   --  243  APTT  --  198*  --  133* 131*  --   --   --   LABPROT 15.5*  --   --   --   --   --   --   --   INR 1.2  --   --   --   --   --   --   --   HEPARINUNFRC  --  0.87*   < >  --  0.41 0.15* 0.22* 0.44  CREATININE  --  0.62  --   --   --  0.66  --  0.63   < > = values in this interval not displayed.    Estimated Creatinine Clearance: 56.1 mL/min (by C-G formula based on SCr of 0.63 mg/dL).   Medical History: Past Medical History:  Diagnosis Date   CAD in native artery    a. DES to ramus 2005 with late stent thrombosis 2006 tx with PTCA. 12/18 PCI/DES x1 to mRCA, EF 50-55%   CHF (congestive heart failure) (HCC)    Chronic pain    COPD (chronic obstructive pulmonary disease) (HCC)    Hyperlipidemia    Hypertension    Left bundle branch block    Lymphedema    Metastatic breast cancer    a. to bone.   MI (myocardial infarction) (HCC)    Mild aortic stenosis 10/2017   Morbid obesity (HCC)    PAF (paroxysmal atrial fibrillation) (HCC)    PSVT (paroxysmal supraventricular tachycardia) (HCC)     a. per [facility] notes, seen on event monitor in 2014.   Pulmonary nodules    Assessment: 78 y/o female presenting with right upper quadrant abdominal pain with nausea and vomiting. PMH significant for hypertension, COPD, CAD, DES placement, diastolic CHF, A-fib on Eliquis , depression with anxiety, CKD-3B, PSVT, chronic pain, breast cancer metastasized to bone, left renal carcinoma, acute cholangitis and obstructive jaundice (s/p of ERCP). Pharmacy has been consulted to transition to heparin  infusion.  Last dose of apixaban : 2.5 mg on 5/21 at 0821  5/22 @ 0436:  aPTT = 198,  HL = 0.87 5/22 @ 1438: aPTT = 133 5/23 @  0305: aPTT = 131,  HL = 0.41 5/23 @   1200: HL 0.15  5/23 @   2102: HL 0.22 5/24 @  0630: HL 0.44,   therapeutic x1  Goal of Therapy:  Heparin  level  0.3-0.7 units/ml Monitor platelets by anticoagulation protocol: Yes   Plan:  5/24 @  0630: HL 0.44,   therapeutic x1 continue heparin  infusion rate at 600 units/hour Check confirmatory heparin  level in 8 hours  Hgb 7.2   Plt 243 Monitor daily heparin  levels while on heparin  infusion Monitor CBC and signs/symptoms of bleeding  Thank you for involving pharmacy in this patient's care.   Scotty Cyphers, PharmD Clinical Pharmacist 04/23/2024 7:47 AM

## 2024-04-23 NOTE — Discharge Instructions (Signed)
 Hold lovenox  the morning of your procedure

## 2024-04-23 NOTE — Discharge Summary (Signed)
 Stacey  ETHA [name] UJW:[id number] DOB: Aug 20, 1946 DOA: 04/14/2024  PCP: [name], MD  Admit date: 04/14/2024 Discharge date: 04/25/2024  Time spent: 35 minutes  Recommendations for Outpatient Follow-up:  [facility] f/u Infectious disease follow up after biopsy     Discharge Diagnoses:  Principal Problem:   Ascending cholangitis Active Problems:   Biliary obstruction   Atrial fibrillation, chronic (HCC)   CAD (coronary artery disease)   HLD (hyperlipidemia)   Chronic diastolic CHF (congestive heart failure) (HCC)   Chronic kidney disease, stage 3b (HCC)   CAD in native artery   COPD (chronic obstructive pulmonary disease) (HCC)   HTN (hypertension)   Thrombocytopenia (HCC)   Anxiety and depression   Bipolar 2 disorder, major depressive episode (HCC)   Obesity, Class II, BMI 35-39.9   Metastasis to bone [facility])   Liver lesion   Biliary stricture   Liver abscess   Discharge Condition: stable  Diet recommendation: heart healthy  Filed Weights   04/21/24 0500 04/23/24 0451 04/24/24 0400  Weight: 82.6 kg 82.5 kg 81.3 kg    History of present illness:  From admission h and p Stacey [name] is a 78 y.o. female with medical history significant of hypertension, COPD, CAD, DES placement, diastolic CHF, A-fib on Eliquis , depression with anxiety, CKD-3B, PSVT, chronic pain, left bundle blockade, spinal stenosis, breast cancer metastasized to bone, left renal carcinoma, acute cholangitis and obstructive jaundice (s/p of ERCP), who presented to [facility] due to nausea, vomiting, right upper quadrant abdominal pain. Pt was accepted for transferring from [facility] per Dr. Isom Marin of [facility] ED on 5/13, arrived today.     Patient was recently hospitalized to [facility] on 4/9 - 4/18 due to acute cholangitis and obstructive jaundice.  Patient is s/p of ERCP, found to have duodenal ulcer, duodenal and sphincter stenosis.  Stent was placed and the purulent discharge was removed.    Patient  states that she has nausea, multiple episodes of nonbilious nonbloody vomiting and abdominal pain in the past several days.  No fever or chills.  Abdominal pain is located in the upper abdomen, constant, mild to moderate, aching, nonradiating.  No diarrhea.  No fever or chills.  She states she had some chest discomfort yesterday, which has resolved, currently no active chest pain.  She has mild dry cough, mild SOB due to COPD.  No symptoms of UTI.   CTA of abdomen/pelvis done in [facility] showed severe intra- and extrahepatic biliary ductal dilation.  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  Common bile duct diffuse mucosa hyperenhancement, nonspecific, but can be seen with ascending cholangitis.  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  Osseous metastases not significantly changed.   Liver function with ALP 895, AST 135, ALT 41, total bilirubin 7.1, direct bilirubin 6.09.  Renal function stable, WBC 7.2, INR 1.23.  Temperature normal, initial blood pressure low with SBP in 80s, which improved to 109/63 after 30 cc/kg normal saline resuscitation.  Heart rate 100, RR 15, oxygen  saturation 99% on room air.  Chest x-ray negative for infiltration. Pt was given Zosyn  in ED.   Dr. Isom Marin of ED in [facility] consulted Dr. Bridgett Camps of GI who reached to Dr. Ole Berkeley of [facility] GI. Dr. Ole Berkeley agreed to see pt after arrival to [facility]. Pt is accepted to PCU as inpt.  Hospital Course:  Patient presents with cholangitis from biliary stricture. Dr. Ole Berkeley of GI placed a metal stent and patient has  clinically improved. We do not have culture data. Patient was treated with IV zosyn  and this has been transitioned to oral augmentin per ID recs. CT scan shows new liver lesions, ddx includes abscess from this cholangitis, or metastases (has known metastatic breast cancer as well as renal cancer). Dr. Wilhelmenia Harada of oncology was consulted and advised IR biopsy. However patient takes  apixaban  and plavix  and these need to be held for at least 5 days prior to IR procedure. Patient will thus be discharged on bridging lovenox  and plan is outpatient IR biopsy this coming week - the IR team is aware and their central schedulers will reach out to the patient to schedule, per Dr. Mabel Savage their team will place the requisite orders at that time (needs pathology as well as culture). Per ID, patient will need a total of 4 weeks antibiotics if the lesions are abscesses so sufficient antibiotic for that duration has been prescribed. Hgb stable but has drifted to around 7, no bleeding. PRBC transfusion advised, patient declined - will need close monitoring of hgb as outpt. Other chronic medical problems stable. Will need close f/u with her oncology team, family is aware. Palliative also met with the patient here as prognosis remains poor. For now she desires to continue with these plans but is aware of hospice and so forth.   Discharge delayed one day as EMS unable to transport her on 5/24.   Procedures: ERCP with biliary stent placement  Consultations: GI, ID, oncology  Discharge Exam: Vitals:   04/24/24 0400 04/24/24 0909  BP: 138/62 (!) 170/71  Pulse: 87 (!) 104  Resp: 16 18  Temp: 98.3 F (36.8 C) 98.3 F (36.8 C)  SpO2: 98% 100%    General exam: Appears frail, fatigued, chronically ill Respiratory system: Poor respiratory effort.  Lungs clear.  Room air Cardiovascular system: S1-S2, RRR, no murmurs, no pedal edema Gastrointestinal system: Obese, nondistended, soft Central nervous system: moving all 4, alert Extremities: LE edema, decreased muscle tone Skin: bruising extremities Psychiatry: Judgement and insight appear impaired. Mood & affect flattened.   Discharge Instructions   Discharge Instructions     Diet - low sodium heart healthy   Complete by: As directed    Discharge patient   Complete by: As directed    Discharge disposition: 01-Home or Self Care    Discharge patient date: 04/24/2024   Discharge wound care:   Complete by: As directed    Continue home wound care   Increase activity slowly   Complete by: As directed       Allergies as of 04/24/2024       Reactions   Brilinta  [ticagrelor ] Diarrhea, Nausea Only   Nausea and severe diarrhea- general weakness. Patient says does not want to take again   Budesonide  Palpitations   Albuterol  Other (See Comments)   Heart racing        Medication List     PAUSE taking these medications    apixaban  2.5 MG Tabs tablet Wait to take this until your doctor or other care provider tells you to start again. Commonly known as: ELIQUIS  Take 2.5 mg by mouth 2 (two) times daily.   clopidogrel  75 MG tablet Wait to take this until your doctor or other care provider tells you to start again. Commonly known as: PLAVIX  Take 75 mg by mouth daily.       STOP taking these medications    atorvastatin  80 MG tablet Commonly known as: LIPITOR   TAKE these medications    amLODipine  5 MG tablet Commonly known as: NORVASC  TAKE 1 TABLET EVERY DAY   amoxicillin-clavulanate 875-125 MG tablet Commonly known as: AUGMENTIN Take 1 tablet by mouth 2 (two) times daily for 21 days.   b complex vitamins capsule Take 1 capsule by mouth daily.   carboxymethylcellulose 0.5 % Soln Commonly known as: REFRESH PLUS Place 1 drop into both eyes 3 (three) times daily as needed (Dry Eyes).   diclofenac  Sodium 1 % Gel Commonly known as: VOLTAREN  Apply topically.   DULoxetine  30 MG capsule Commonly known as: CYMBALTA  Take 30 mg by mouth daily.   enoxaparin  100 MG/ML injection Commonly known as: LOVENOX  Inject 0.825 mLs (82.5 mg total) into the skin every 12 (twelve) hours for 7 days.   ferrous sulfate  325 (65 FE) MG EC tablet Take 325 mg by mouth 3 (three) times daily with meals.   HYDROmorphone  2 MG tablet Commonly known as: DILAUDID  Take 4 mg by mouth every 4 (four) hours as needed for  severe pain (pain score 7-10).   HYDROmorphone  1 MG/ML injection Commonly known as: DILAUDID  Inject 0.5 mLs (0.5 mg total) into the vein every 2 (two) hours as needed for severe pain (pain score 7-10) or moderate pain (pain score 4-6).   leptospermum manuka honey Pste paste Apply 1 Application topically daily.   losartan  100 MG tablet Commonly known as: COZAAR  TAKE 1 TABLET EVERY DAY   methocarbamol  500 MG tablet Commonly known as: ROBAXIN  Take 500 mg by mouth every 6 (six) hours as needed for muscle spasms.   metoprolol  succinate 25 MG 24 hr tablet Commonly known as: TOPROL -XL Take 3 tablets (75 mg total) by mouth daily.   nitroGLYCERIN  0.4 MG SL tablet Commonly known as: NITROSTAT  Place 0.4 mg under the tongue every 5 (five) minutes as needed for chest pain.   ondansetron  4 MG/2ML Soln injection Commonly known as: ZOFRAN  Inject 2 mLs (4 mg total) into the vein every 6 (six) hours as needed for nausea or vomiting.   pantoprazole  40 MG tablet Commonly known as: Protonix  Take 1 tablet (40 mg total) by mouth 2 (two) times daily.               Discharge Care Instructions  (From admission, onward)           Start     Ordered   04/23/24 0000  Discharge wound care:       Comments: Continue home wound care   04/23/24 1235           Allergies  Allergen Reactions   Brilinta  [Ticagrelor ] Diarrhea and Nausea Only    Nausea and severe diarrhea- general weakness. Patient says does not want to take again   Budesonide  Palpitations   Albuterol  Other (See Comments)    Heart racing     Follow-up Information     Paulett Boros, MD Follow up.   Specialty: Hematology Contact information: [address] [place] [address] [phone]         Interventional radiology Follow up.   Why: they will call you to schedule the procedure, but their number is [phone]        Alica Inks, MD Follow up.   Specialty: Infectious Diseases Why:  contact for antibiotic instructions after your biopsy. Contact information: [address] [place] [address] [phone]                  The results of significant diagnostics from  this hospitalization (including imaging, microbiology, ancillary and laboratory) are listed below for reference.    Significant Diagnostic Studies: CT ABDOMEN PELVIS WO CONTRAST Result Date: 04/19/2024 CLINICAL DATA:  Abdominal pain, acute, nonlocalized EXAM: CT ABDOMEN AND PELVIS WITHOUT CONTRAST TECHNIQUE: Multidetector CT imaging of the abdomen and pelvis was performed following the standard protocol without IV contrast. RADIATION DOSE REDUCTION: This exam was performed according to the departmental dose-optimization program which includes automated exposure control, adjustment of the mA and/or kV according to patient size and/or use of iterative reconstruction technique. COMPARISON:  March 08, 2024 FINDINGS: Of note, the lack of intravenous contrast limits evaluation of the solid organ parenchyma and vascularity. Lower chest: Small bilateral pleural effusions, slightly larger on the left than the right, with bibasilar atelectasis. Coronary artery atherosclerosis. Hepatobiliary: Multiple small hepatic hypodensities have developed in the interim. For example, in the posterior right hepatic dome there is a 1.7 cm hypodense lesion (axial 14). In the medial right hepatic lobe there is a 1.8 cm lesion (axial 19). Anterior left hepatic lobe lesion measuring 1.8 cm (axial 15). Cholecystectomy. Decompression of the intrahepatic biliary tree with diffuse pneumobilia. Well-positioned metallic biliary duct stent in the common bile duct. No radiopaque choledocholithiasis. Pancreas: No mass or main ductal dilation. No peripancreatic inflammation or fluid collection. Spleen: Normal size. No mass. Adrenals/Urinary Tract: No adrenal masses. Unchanged thick-walled cystic structure in the left kidney measuring 3.1 x 3 cm.  No hydronephrosis or nephrolithiasis. The urinary bladder is completely decompressed. Stomach/Bowel: The stomach is decompressed without focal abnormality. No small bowel wall thickening or inflammation. No small bowel obstruction.Descending and sigmoid colonic diverticulosis. No changes of acute diverticulitis. Vascular/Lymphatic: No aortic aneurysm. Diffuse aortoiliac atherosclerosis. No intraabdominal or pelvic lymphadenopathy. Reproductive: Hysterectomy. No concerning adnexal mass. No free pelvic fluid. Other: No pneumoperitoneum. Small volume ascites predominantly in the left paracolic gutter. Musculoskeletal: No acute fracture or destructive lesion. Unchanged peripherally calcified mass in the lateral right breast. Diffuse osteopenia. Multilevel degenerative disc disease of the spine. Large sclerotic lesion within the L2 vertebral body, unchanged. Severe pathologic compression fracture with retropulsion at L1 with retropulsion and moderate canal narrowing, unchanged. Multilevel degenerative disc disease of the spine. Diffuse anasarca. Infusion device in the right lower quadrant with a catheter extending into the thoracic spine, outside the field of view. IMPRESSION: 1. Interval development of multiple new hepatic hypodensities measuring up to 1.8 cm, worrisome for progressive metastatic disease. 2. Unchanged thick-walled left renal cystic lesion, consistent with renal cell carcinoma. 3. Small bilateral pleural effusions slightly larger on the right than the left. Small volume ascites, predominantly in the left paracolic gutter. Anasarca. This is likely related to the patient's volume status. 4. Unchanged bony metastases in L1 and L2 with severe pathologic compression fracture of L1 and moderate canal narrowing. Electronically Signed   By: Rance Burrows M.D.   On: 04/19/2024 21:13   DG C-Arm 1-60 Min-No Report Result Date: 04/15/2024 Fluoroscopy was utilized by the requesting physician.  No radiographic  interpretation.    Microbiology: No results found for this or any previous visit (from the past 240 hours).    Labs: Basic Metabolic Panel: Recent Labs  Lab 04/19/24 0412 04/19/24 1720 04/21/24 0436 04/22/24 1200 04/23/24 0630 04/24/24 0512  NA 137  --  139 137 136 139  K 3.4*  --  4.1 3.8 3.5 3.9  CL 109  --  110 104 106 106  CO2 22  --  23 22 22 24   GLUCOSE 117* 122* 82  97 88 130*  BUN 12  --  7* 6* 6* 7*  CREATININE 0.74  --  0.62 0.66 0.63 0.71  CALCIUM  6.6*  --  7.1* 7.1* 7.3* 8.1*   Liver Function Tests: Recent Labs  Lab 04/18/24 1333 04/19/24 0412 04/21/24 0436  AST 105* 98* 98*  ALT 47* 46* 47*  ALKPHOS 539* 548* 564*  BILITOT 5.0* 4.2* 2.9*  PROT 5.6* 5.5* 5.7*  ALBUMIN  1.6* 1.5* 1.6*   No results for input(s): "LIPASE", "AMYLASE" in the last 168 hours. No results for input(s): "AMMONIA" in the last 168 hours. CBC: Recent Labs  Lab 04/19/24 0412 04/21/24 0436 04/22/24 0305 04/22/24 1200 04/23/24 0630 04/24/24 0512  WBC 9.0 10.3 9.3  --  7.6 7.4  NEUTROABS 6.6  --   --   --   --   --   HGB 7.4* 7.3* 6.8* 7.1* 7.2* 6.6*  HCT 23.5* 23.1* 21.0*  --  22.5* 21.1*  MCV 98.3 99.1 97.7  --  99.6 100.5*  PLT 117* 183 212  --  243 257   Cardiac Enzymes: No results for input(s): "CKTOTAL", "CKMB", "CKMBINDEX", "TROPONINI" in the last 168 hours. BNP: BNP (last 3 results) Recent Labs    04/14/24 1912  BNP 406.8*    ProBNP (last 3 results) No results for input(s): "PROBNP" in the last 8760 hours.  CBG: Recent Labs  Lab 04/20/24 0838 04/20/24 1108 04/20/24 1632 04/20/24 1725 04/22/24 0932  GLUCAP 122* 123* 64* 92 73       Signed:  Raymonde Calico MD.  [facility] 04/25/2024, 1:29 PM

## 2024-04-24 DIAGNOSIS — K8309 Other cholangitis: Secondary | ICD-10-CM | POA: Diagnosis not present

## 2024-04-24 LAB — BPAM RBC
Blood Product Expiration Date: 202506282359
Unit Type and Rh: 6200

## 2024-04-24 LAB — TYPE AND SCREEN
ABO/RH(D): A POS
Antibody Screen: NEGATIVE
Unit division: 0

## 2024-04-24 LAB — BASIC METABOLIC PANEL WITH GFR
Anion gap: 9 (ref 5–15)
BUN: 7 mg/dL — ABNORMAL LOW (ref 8–23)
CO2: 24 mmol/L (ref 22–32)
Calcium: 8.1 mg/dL — ABNORMAL LOW (ref 8.9–10.3)
Chloride: 106 mmol/L (ref 98–111)
Creatinine, Ser: 0.71 mg/dL (ref 0.44–1.00)
GFR, Estimated: >60 mL/min (ref >60)
Glucose, Bld: 130 mg/dL — ABNORMAL HIGH (ref 70–99)
Potassium: 3.9 mmol/L (ref 3.5–5.1)
Sodium: 139 mmol/L (ref 135–145)

## 2024-04-24 LAB — CBC
HCT: 21.1 % — ABNORMAL LOW (ref 36.0–46.0)
Hemoglobin: 6.6 g/dL — ABNORMAL LOW (ref 12.0–15.0)
MCH: 31.4 pg (ref 26.0–34.0)
MCHC: 31.3 g/dL (ref 30.0–36.0)
MCV: 100.5 fL — ABNORMAL HIGH (ref 80.0–100.0)
Platelets: 257 10*3/uL (ref 150–400)
RBC: 2.1 MIL/uL — ABNORMAL LOW (ref 3.87–5.11)
RDW: 21.8 % — ABNORMAL HIGH (ref 11.5–15.5)
WBC: 7.4 10*3/uL (ref 4.0–10.5)
nRBC: 0 % (ref 0.0–0.2)

## 2024-04-24 LAB — PREPARE RBC (CROSSMATCH)

## 2024-04-24 NOTE — TOC Transition Note (Addendum)
 Transition of Care [facility]) - Discharge Note   Patient Details  Name: Stacey [name]  PRAIRIE STENBERG MRN: [id number] Date of Birth: 15-Jul-1946  Transition of Care [facility]) CM/SW Contact:  Holland Lundborg, RN Phone Number: 04/24/2024, 10:15 AM   Clinical Narrative:     Patient with discharge orders, daughter notified of plan to go home today.  [facility] called (spoke with Will) to provide transportation, she is next on the list, pick up time given between 2-3pm.  Medical Necessity and facesheet completed and printed, bedside nurse made aware.    Noted that patient was active with [facility] for nursing services prior to admit, Bartholomew Light notified of discharge and will resume services.   Final next level of care: Home/Self Care Barriers to Discharge: Barriers Resolved   Patient Goals and CMS Choice Patient states their goals for this hospitalization and ongoing recovery are:: Home with family support          Discharge Placement                       Discharge Plan and Services Additional resources added to the After Visit Summary for                                       Social Drivers of Health (SDOH) Interventions SDOH Screenings   Food Insecurity: No Food Insecurity (04/14/2024)  Housing: Low Risk  (04/14/2024)  Transportation Needs: No Transportation Needs (04/14/2024)  Utilities: Not At Risk (04/14/2024)  Financial Resource Strain: High Risk (01/04/2024)   Received from [facility]  Physical Activity: Inactive (08/17/2023)   Received from [facility]  Social Connections: Unknown (04/14/2024)  Recent Concern: Social Connections - Socially Isolated (03/09/2024)  Stress: No Stress Concern Present (08/17/2023)   Received from [facility]  Tobacco Use: Medium Risk (04/15/2024)  Health Literacy: Inadequate Health Literacy (08/17/2023)   Received from [facility]     Readmission Risk Interventions    03/09/2024     8:15 AM  Readmission Risk Prevention Plan  Transportation Screening Complete  HRI or Home Care Consult Complete  Social Work Consult for Recovery Care Planning/Counseling Complete  Palliative Care Screening Complete  Medication Review Oceanographer) Complete

## 2024-04-27 ENCOUNTER — Other Ambulatory Visit: Payer: Self-pay | Admitting: Oncology

## 2024-04-27 ENCOUNTER — Telehealth: Payer: Self-pay

## 2024-04-27 DIAGNOSIS — R160 Hepatomegaly, not elsewhere classified: Secondary | ICD-10-CM

## 2024-04-27 NOTE — Telephone Encounter (Signed)
-----   Message from Nurse Claudis Cumber sent at 04/27/2024 10:47 AM EDT ----- It looks like pt is established with oncologist at [facility] (Dr. Cheree Cords). Scheduled for follow up next week. I called pt a few times and left VM asking pt is she going to continue care there or if she wants to see Dr. Wilhelmenia Harada, but have not gotten a response. I spoke to [place] with IR and she said she would reach out to [facility] to see if she could get an order for the liver biopsy from Dr. Cheree Cords.    Thanks Nellie Banas ----- Message ----- From: Timmy Forbes, MD Sent: 04/24/2024   1:30 PM EDT To: Craven Do, RN; Ronell Coe, CMA; #  I saw this patient during hospitalization.  Plan is to get liver lesion biopsy asap. She was on eliquis /plavix  so can not be done inpatient.  She also sees [facility] oncology Please reach out to see if she would like to get liver lesion biopsy her at [facility].  If yes, please arrange and she will see me 1 week after that. MD follow up.  If she would like to get it done at [facility], she is responsible of calling [facility] to arrange.  please document thanks.

## 2024-04-28 ENCOUNTER — Other Ambulatory Visit: Payer: Self-pay | Admitting: Radiology

## 2024-04-28 DIAGNOSIS — R160 Hepatomegaly, not elsewhere classified: Secondary | ICD-10-CM

## 2024-04-28 DIAGNOSIS — K769 Liver disease, unspecified: Secondary | ICD-10-CM

## 2024-04-28 NOTE — Progress Notes (Signed)
 Patient for US  guided Liver Biopsy on Friday 04/29/24, I called and spoke with the patient's daughter, Holland Lundborg on the phone and gave pre-procedure instructions. Holland Lundborg was made aware to have the patient here at 10a, hold lovenox  1 day before procedure, NPO after MN prior to procedure as well as driver post procedure/recovery/discharge. Monica stated understanding.  Called 04/27/24  [facility] will be transporting the patient here and back home - Spoke with Mont Antis to make the arrangements on 04/27/24

## 2024-04-29 ENCOUNTER — Other Ambulatory Visit: Payer: Self-pay

## 2024-04-29 ENCOUNTER — Ambulatory Visit
Admission: RE | Admit: 2024-04-29 | Discharge: 2024-04-29 | Disposition: A | Discharge status: Home / Self Care | Source: Ambulatory Visit | Attending: Oncology | Admitting: Oncology

## 2024-04-29 ENCOUNTER — Other Ambulatory Visit: Payer: Self-pay | Admitting: Oncology

## 2024-04-29 DIAGNOSIS — K769 Liver disease, unspecified: Secondary | ICD-10-CM | POA: Insufficient documentation

## 2024-04-29 DIAGNOSIS — R160 Hepatomegaly, not elsewhere classified: Secondary | ICD-10-CM

## 2024-04-29 DIAGNOSIS — Z7901 Long term (current) use of anticoagulants: Secondary | ICD-10-CM | POA: Diagnosis not present

## 2024-04-29 DIAGNOSIS — Z5309 Procedure and treatment not carried out because of other contraindication: Secondary | ICD-10-CM | POA: Insufficient documentation

## 2024-04-29 LAB — CBC
HCT: 22.5 % — ABNORMAL LOW (ref 36.0–46.0)
Hemoglobin: 7.2 g/dL — ABNORMAL LOW (ref 12.0–15.0)
MCH: 33.6 pg (ref 26.0–34.0)
MCHC: 32 g/dL (ref 30.0–36.0)
MCV: 105.1 fL — ABNORMAL HIGH (ref 80.0–100.0)
Platelets: 220 10*3/uL (ref 150–400)
RBC: 2.14 MIL/uL — ABNORMAL LOW (ref 3.87–5.11)
RDW: 22.3 % — ABNORMAL HIGH (ref 11.5–15.5)
WBC: 4.6 10*3/uL (ref 4.0–10.5)
nRBC: 0 % (ref 0.0–0.2)

## 2024-04-29 LAB — PROTIME-INR
INR: 1.1 (ref 0.8–1.2)
Prothrombin Time: 14.1 s (ref 11.4–15.2)

## 2024-04-29 MED ORDER — SODIUM CHLORIDE 0.9 % IV SOLN: INTRAVENOUS | Status: DC

## 2024-04-29 NOTE — Progress Notes (Addendum)
 Interventional Radiology Brief Note:  Patient presented to [facility] for liver lesion biopsy.  She unfortunately took lovenox  injection last night at 9pm. Consideration made for delayed biopsy, however her pre-procedure lab work showed ongoing chronic anemia with Hgb 7.2 her risk of bleeding with poor compensation is increased.  Discussed with son and recommended contacting Dr. Emmaline Haring office for instruction on whether lovenox  bridge is warranted for this patient.   Relevant to this patient, SIRS procedural guidelines recommend Plavix  hold for 5 days, Eliquis  hold for 48 hrs, and lovenox  hold for 24 hrs.   Discussed with team, plan made to reschedule patient for Monday at 11am.  Patient should continue to hold Plavix  AND Eliquis .  Continue lovenox  BID dosing.  Last dose will be Saturday PM for Monday procedure.    Deland Slocumb, MS RD PA-C

## 2024-04-29 NOTE — Discharge Instructions (Addendum)
 Hold Plavix  AND Eliquis  until procedure.  Continue lovenox  tonight, twice Saturday then stop.  Last dose Saturday PM for Monday procedure.

## 2024-04-29 NOTE — Progress Notes (Signed)
 Patient for US  guided Liver Biopsy on Monday 05/02/24, I called and spoke with the patient's son, Baker Bon on the phone and gave pre-procedure instructions. Baker Bon was made aware to have the patient here at 10a, hold lovenox  1 day before procedure, NPO after MN prior to procedure as well as driver post procedure/recovery/discharge. Baker Bon stated understanding.  Called 04/29/24  [facility] will be transporting the patient here and back home - Spoke with [place] to make arrangements on 04/29/2024

## 2024-05-01 ENCOUNTER — Encounter (HOSPITAL_COMMUNITY): Payer: Self-pay

## 2024-05-01 ENCOUNTER — Emergency Department (HOSPITAL_COMMUNITY)

## 2024-05-01 ENCOUNTER — Emergency Department (HOSPITAL_COMMUNITY)
Admission: EM | Admit: 2024-05-01 | Discharge: 2024-05-02 | Disposition: A | Discharge status: Home / Self Care | Attending: Emergency Medicine | Admitting: Emergency Medicine

## 2024-05-01 ENCOUNTER — Other Ambulatory Visit: Payer: Self-pay

## 2024-05-01 DIAGNOSIS — J449 Chronic obstructive pulmonary disease, unspecified: Secondary | ICD-10-CM | POA: Insufficient documentation

## 2024-05-01 DIAGNOSIS — C7951 Secondary malignant neoplasm of bone: Secondary | ICD-10-CM | POA: Insufficient documentation

## 2024-05-01 DIAGNOSIS — Z7901 Long term (current) use of anticoagulants: Secondary | ICD-10-CM | POA: Insufficient documentation

## 2024-05-01 DIAGNOSIS — Z7902 Long term (current) use of antithrombotics/antiplatelets: Secondary | ICD-10-CM | POA: Insufficient documentation

## 2024-05-01 DIAGNOSIS — I2510 Atherosclerotic heart disease of native coronary artery without angina pectoris: Secondary | ICD-10-CM | POA: Diagnosis not present

## 2024-05-01 DIAGNOSIS — I509 Heart failure, unspecified: Secondary | ICD-10-CM | POA: Insufficient documentation

## 2024-05-01 DIAGNOSIS — K5900 Constipation, unspecified: Secondary | ICD-10-CM | POA: Insufficient documentation

## 2024-05-01 DIAGNOSIS — D649 Anemia, unspecified: Secondary | ICD-10-CM | POA: Insufficient documentation

## 2024-05-01 DIAGNOSIS — Z853 Personal history of malignant neoplasm of breast: Secondary | ICD-10-CM | POA: Diagnosis not present

## 2024-05-01 DIAGNOSIS — M791 Myalgia, unspecified site: Secondary | ICD-10-CM | POA: Diagnosis present

## 2024-05-01 DIAGNOSIS — I110 Hypertensive heart disease with heart failure: Secondary | ICD-10-CM | POA: Diagnosis not present

## 2024-05-01 DIAGNOSIS — Z79899 Other long term (current) drug therapy: Secondary | ICD-10-CM | POA: Insufficient documentation

## 2024-05-01 LAB — CBC WITH DIFFERENTIAL/PLATELET
Abs Immature Granulocytes: 0.1 10*3/uL — ABNORMAL HIGH (ref 0.00–0.07)
Basophils Absolute: 0.1 10*3/uL (ref 0.0–0.1)
Basophils Relative: 1 %
Eosinophils Absolute: 0.1 10*3/uL (ref 0.0–0.5)
Eosinophils Relative: 1 %
HCT: 21.3 % — ABNORMAL LOW (ref 36.0–46.0)
Hemoglobin: 6.6 g/dL — CL (ref 12.0–15.0)
Immature Granulocytes: 2 %
Lymphocytes Relative: 24 %
Lymphs Abs: 1.6 10*3/uL (ref 0.7–4.0)
MCH: 33.8 pg (ref 26.0–34.0)
MCHC: 31 g/dL (ref 30.0–36.0)
MCV: 109.2 fL — ABNORMAL HIGH (ref 80.0–100.0)
Monocytes Absolute: 0.6 10*3/uL (ref 0.1–1.0)
Monocytes Relative: 8 %
Neutro Abs: 4.3 10*3/uL (ref 1.7–7.7)
Neutrophils Relative %: 64 %
Platelets: 270 10*3/uL (ref 150–400)
RBC: 1.95 MIL/uL — ABNORMAL LOW (ref 3.87–5.11)
RDW: 22 % — ABNORMAL HIGH (ref 11.5–15.5)
WBC: 6.7 10*3/uL (ref 4.0–10.5)
nRBC: 0.4 % — ABNORMAL HIGH (ref 0.0–0.2)

## 2024-05-01 LAB — AMMONIA: Ammonia: 36 umol/L — ABNORMAL HIGH (ref 9–35)

## 2024-05-01 LAB — COMPREHENSIVE METABOLIC PANEL WITH GFR
ALT: 24 U/L (ref 0–44)
AST: 65 U/L — ABNORMAL HIGH (ref 15–41)
Albumin: 1.7 g/dL — ABNORMAL LOW (ref 3.5–5.0)
Alkaline Phosphatase: 598 U/L — ABNORMAL HIGH (ref 38–126)
Anion gap: 8 (ref 5–15)
BUN: 17 mg/dL (ref 8–23)
CO2: 27 mmol/L (ref 22–32)
Calcium: 9.1 mg/dL (ref 8.9–10.3)
Chloride: 97 mmol/L — ABNORMAL LOW (ref 98–111)
Creatinine, Ser: 0.62 mg/dL (ref 0.44–1.00)
GFR, Estimated: >60 mL/min (ref >60)
Glucose, Bld: 123 mg/dL — ABNORMAL HIGH (ref 70–99)
Potassium: 3.8 mmol/L (ref 3.5–5.1)
Sodium: 132 mmol/L — ABNORMAL LOW (ref 135–145)
Total Bilirubin: 2.2 mg/dL — ABNORMAL HIGH (ref 0.0–1.2)
Total Protein: 6.5 g/dL (ref 6.5–8.1)

## 2024-05-01 LAB — PREPARE RBC (CROSSMATCH)

## 2024-05-01 LAB — LIPASE, BLOOD: Lipase: 24 U/L (ref 11–51)

## 2024-05-01 MED ORDER — ONDANSETRON HCL 4 MG/2ML IJ SOLN
4.0000 mg | Freq: Once | INTRAMUSCULAR | Status: AC
  Administered 2024-05-01: 4 mg via INTRAVENOUS
  Filled 2024-05-01: qty 2

## 2024-05-01 MED ORDER — HYDROMORPHONE HCL 1 MG/ML IJ SOLN
1.0000 mg | Freq: Once | INTRAMUSCULAR | Status: AC
  Administered 2024-05-01: 1 mg via INTRAVENOUS
  Filled 2024-05-01: qty 1

## 2024-05-01 MED ORDER — POLYETHYLENE GLYCOL 3350 17 G PO PACK
17.0000 g | PACK | Freq: Once | ORAL | Status: AC
  Administered 2024-05-01: 17 g via ORAL
  Filled 2024-05-01: qty 1

## 2024-05-01 MED ORDER — IOHEXOL 300 MG/ML  SOLN
100.0000 mL | Freq: Once | INTRAMUSCULAR | Status: AC | PRN
  Administered 2024-05-01: 100 mL via INTRAVENOUS

## 2024-05-01 MED ORDER — MORPHINE SULFATE (PF) 4 MG/ML IV SOLN: 4.0000 mg | Freq: Once | INTRAVENOUS | Status: DC

## 2024-05-01 MED ORDER — SODIUM CHLORIDE 0.9% IV SOLUTION
Freq: Once | INTRAVENOUS | Status: DC
Start: 2024-05-01 — End: 2024-05-02

## 2024-05-01 NOTE — ED Notes (Signed)
 Pt a/w EMS for convo transport home

## 2024-05-01 NOTE — ED Provider Notes (Signed)
 [place] EMERGENCY DEPARTMENT AT [facility] Provider Note   CSN: [id number] Arrival date & time: 05/01/24  1518     History  Chief Complaint  Patient presents with   Pain    Stacey  B [name] is a 78 y.o. female.  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  GI placed a metal stent and pt improved.  IR unable to do a bx of the liver lesion during admission as she has been on Eliquis  and Plavix .  She was d/c with lovenox  as a bridge and was scheduled to get a bx on 5/30, but took Lovenox  the night before the procedure and had to be off for 24 hrs, so bx rescheduled for tomorrow.  She comes in today because of severe pain all over the right side of her body.  She denies any falls.  She does have chronic pain in these areas, but feels like it is worse.  She is non ambulatory.           Home Medications Prior to Admission medications   Medication Sig Start Date End Date Taking? Authorizing Provider  amLODipine  (NORVASC ) 5 MG tablet TAKE 1 TABLET EVERY DAY 04/06/24   Laurann Pollock, MD  amoxicillin -clavulanate (AUGMENTIN ) 875-125 MG tablet Take 1 tablet by mouth 2 (two) times daily for 21 days. 04/23/24 05/14/24  Wouk, Haynes Lips, MD  apixaban  (ELIQUIS ) 2.5 MG TABS tablet Take 2.5 mg by mouth 2 (two) times daily.    [provider]  b complex vitamins capsule Take 1 capsule by mouth daily.    [provider]  carboxymethylcellulose (REFRESH PLUS) 0.5 % SOLN Place 1 drop into both eyes 3 (three) times daily as needed (Dry Eyes).    [provider]  clopidogrel  (PLAVIX ) 75 MG tablet Take 75 mg by mouth daily.    [provider]  diclofenac  Sodium (VOLTAREN ) 1 % GEL Apply  topically. 02/25/24   [provider]  DULoxetine  (CYMBALTA ) 30 MG capsule Take 30 mg by mouth daily.    [provider]  enoxaparin  (LOVENOX ) 100 MG/ML injection Inject 0.825 mLs (82.5 mg total) into the skin every 12 (twelve) hours for 7 days. 04/23/24 04/30/24  Wouk, Haynes Lips, MD  ferrous sulfate  325 (65 FE) MG EC tablet Take 325 mg by mouth 3 (three) times daily with meals.    [provider]  HYDROmorphone  (DILAUDID ) 1 MG/ML injection Inject 0.5 mLs (0.5 mg total) into the vein every 2 (two) hours as needed for severe pain (pain score 7-10) or moderate pain (pain score 4-6). 03/09/24   Ozell Blunt, MD  HYDROmorphone  (DILAUDID ) 2 MG tablet Take 4 mg by mouth every 4 (four) hours as needed for severe pain (pain score 7-10).    [provider]  leptospermum manuka honey (MEDIHONEY) PSTE paste Apply 1 Application topically daily. 04/23/24 05/23/24  Janeane Mealy, MD  losartan  (COZAAR ) 100 MG tablet TAKE 1 TABLET EVERY DAY 04/06/24   Laurann Pollock, MD  methocarbamol  (ROBAXIN ) 500 MG tablet Take 500 mg by mouth every 6 (six) hours as needed for muscle spasms.    [provider]  metoprolol  succinate (TOPROL -XL) 25 MG 24 hr tablet Take 3 tablets (75  mg total) by mouth daily. 03/19/24 04/29/24  Alphonsus Jeans, MD  nitroGLYCERIN  (NITROSTAT ) 0.4 MG SL tablet Place 0.4 mg under the tongue every 5 (five) minutes as needed for chest pain.    [provider]  ondansetron  (ZOFRAN ) 4 MG/2ML SOLN injection Inject 2 mLs (4 mg total) into the vein every 6 (six) hours as needed for nausea or vomiting. 03/09/24   Ozell Blunt, MD  pantoprazole  (PROTONIX ) 40 MG tablet Take 1 tablet (40 mg total) by mouth 2 (two) times daily. 03/18/24 04/29/24  Alphonsus Jeans, MD      Allergies    Brilinta  [ticagrelor ], Budesonide , and Albuterol     Review of Systems   Review of Systems  Musculoskeletal:        Right sided pain  All other systems reviewed and are  negative.   Physical Exam Updated Vital Signs BP 117/65   Pulse 97   Temp 98.1 F (36.7 C) (Oral)   Resp 18   Ht 5' (1.524 m)   Wt 81.3 kg   SpO2 96%   BMI 35.00 kg/m  Physical Exam Vitals and nursing note reviewed.  Constitutional:      Appearance: Normal appearance. She is obese.  HENT:     Head: Normocephalic and atraumatic.     Right Ear: External ear normal.     Left Ear: External ear normal.     Nose: Nose normal.     Mouth/Throat:     Mouth: Mucous membranes are moist.     Pharynx: Oropharynx is clear.  Eyes:     Extraocular Movements: Extraocular movements intact.     Conjunctiva/sclera: Conjunctivae normal.     Pupils: Pupils are equal, round, and reactive to light.  Cardiovascular:     Rate and Rhythm: Normal rate and regular rhythm.     Pulses: Normal pulses.     Heart sounds: Normal heart sounds.  Pulmonary:     Effort: Pulmonary effort is normal.     Breath sounds: Normal breath sounds.  Abdominal:     General: Abdomen is flat. Bowel sounds are normal.     Palpations: Abdomen is soft.  Musculoskeletal:        General: Normal range of motion.     Cervical back: Normal range of motion and neck supple.  Skin:    General: Skin is warm.     Capillary Refill: Capillary refill takes less than 2 seconds.     Findings: Bruising and ecchymosis present.  Neurological:     General: No focal deficit present.     Mental Status: She is alert and oriented to person, place, and time.  Psychiatric:        Mood and Affect: Mood normal.        Behavior: Behavior normal.     ED Results / Procedures / Treatments   Labs (all labs ordered are listed, but only abnormal results are displayed) Labs Reviewed  CBC WITH DIFFERENTIAL/PLATELET - Abnormal; Notable for the following components:      Result Value   RBC 1.95 (*)    Hemoglobin 6.6 (*)    HCT 21.3 (*)    MCV 109.2 (*)    RDW 22.0 (*)    nRBC 0.4 (*)    Abs Immature Granulocytes 0.10 (*)    All other  components within normal limits  COMPREHENSIVE METABOLIC PANEL WITH GFR - Abnormal; Notable for the following components:   Sodium 132 (*)    Chloride 97 (*)  Glucose, Bld 123 (*)    Albumin  1.7 (*)    AST 65 (*)    Alkaline Phosphatase 598 (*)    Total Bilirubin 2.2 (*)    All other components within normal limits  AMMONIA - Abnormal; Notable for the following components:   Ammonia 36 (*)    All other components within normal limits  LIPASE, BLOOD  URINALYSIS, ROUTINE W REFLEX MICROSCOPIC  TYPE AND SCREEN  PREPARE RBC (CROSSMATCH)    EKG None  Radiology CT ABDOMEN PELVIS W CONTRAST Result Date: 05/01/2024 CLINICAL DATA:  Acute nonlocalized abdominal pain EXAM: CT ABDOMEN AND PELVIS WITH CONTRAST TECHNIQUE: Multidetector CT imaging of the abdomen and pelvis was performed using the standard protocol following bolus administration of intravenous contrast. RADIATION DOSE REDUCTION: This exam was performed according to the departmental dose-optimization program which includes automated exposure control, adjustment of the mA and/or kV according to patient size and/or use of iterative reconstruction technique. CONTRAST:  OMNIPAQUE  IOHEXOL  300 MG/ML  SOLN COMPARISON:  04/19/2024 FINDINGS: Lower chest: No acute abnormality. Extensive coronary artery calcification Hepatobiliary: Palliative metallic biliary stent again noted within the extrahepatic bile duct. Pneumobilia within the non dependent intrahepatic biliary tree suggest patency of the stented segment. Status post cholecystectomy. Previously noted hepatic hypodensities are not as well visualized on current examination suggesting that these may have reflected hepatic abscesses or intrahepatic bilomas and have resolved in the interval. No enhancing intrahepatic mass is identified. Pancreas: Unremarkable Spleen: Unremarkable Adrenals/Urinary Tract: The adrenal glands are unremarkable. The kidneys are normal in size and position. 2.7 cm  heterogeneously enhancing solid mass within the pole the left kidney compatible with known renal cell carcinoma is unchanged. Additional simple cortical cysts are seen within the kidneys bilaterally. The kidneys are otherwise unremarkable. Bladder is unremarkable. Stomach/Bowel: Moderate colonic stool burden. Stomach, small bowel, and large bowel are otherwise unremarkable. No evidence of obstruction or focal inflammation. Appendix is absent. No free intraperitoneal gas or fluid. Vascular/Lymphatic: Extensive aortoiliac atherosclerotic calcification. Stable penetrating atherosclerotic ulcer versus short segment focal dissection measuring 1.5 x 2.5 x 0.9 cm in greatest dimension. No aortic aneurysm. No pathologic adenopathy within the abdomen and pelvis. Reproductive: Status post hysterectomy. No adnexal masses. Other: Implanted pump device noted within the subcutaneous right lower quadrant anterior abdominal wall. No abdominal wall hernia. Musculoskeletal: Pathologic compression fracture of L1 is again seen with comminution of the vertebral body and retropulsion of the posterior body fracture fragment by up to 10 mm with resultant severe central canal stenosis with a residual AP diameter of the spinal canal of approximately 3-4 mm (29/2) severe resultant bilateral neuroforaminal narrowing at T12-L1 and L1-L2. Superimposed degenerative changes noted throughout the thoracolumbar spine. IMPRESSION: 1. Palliative metallic biliary stent again noted within the extrahepatic bile duct. Pneumobilia within the non dependent intrahepatic biliary tree suggest patency of the stented segment. 2. Previously noted hepatic hypodensities are not as well visualized on current examination suggesting that these may have reflected hepatic abscesses or intrahepatic bilomas and have resolved in the interval. No enhancing intrahepatic mass is identified. 3. 2.7 cm heterogeneously enhancing solid mass within the pole the left kidney  compatible with known renal cell carcinoma is unchanged. 4. Pathologic compression fracture of L1 is again seen with comminution of the vertebral body and retropulsion of the posterior body fracture fragment by up to 10 mm with resultant severe central canal stenosis with a residual AP diameter of the spinal canal of approximately 3-4 mm. Severe resultant bilateral neuroforaminal narrowing at  T12-L1 and L1-L2. Surgical consultation may be helpful for further management. 5. Stable penetrating atherosclerotic ulcer versus short segment focal dissection measuring 1.5 x 2.5 x 0.9 cm in greatest dimension. 6. Moderate colonic stool burden. No evidence of obstruction or focal inflammation. Aortic Atherosclerosis (ICD10-I70.0). Electronically Signed   By: Worthy Heads M.D.   On: 05/01/2024 19:30    Procedures Procedures    Medications Ordered in ED Medications  0.9 %  sodium chloride  infusion (Manually program via Guardrails IV Fluids) (has no administration in time range)  ondansetron  (ZOFRAN ) injection 4 mg (4 mg Intravenous Given 05/01/24 1702)  HYDROmorphone  (DILAUDID ) injection 1 mg (1 mg Intravenous Given 05/01/24 1703)  iohexol  (OMNIPAQUE ) 300 MG/ML solution 100 mL (100 mLs Intravenous Contrast Given 05/01/24 1829)  HYDROmorphone  (DILAUDID ) injection 1 mg (1 mg Intravenous Given 05/01/24 2025)  polyethylene glycol (MIRALAX  / GLYCOLAX ) packet 17 g (17 g Oral Given 05/01/24 2025)    ED Course/ Medical Decision Making/ A&P                                 Medical Decision Making Amount and/or Complexity of Data Reviewed Labs: ordered. Radiology: ordered.  Risk OTC drugs. Prescription drug management.   This patient presents to the ED for concern of pain, this involves an extensive number of treatment options, and is a complaint that carries with it a high risk of complications and morbidity.  The differential diagnosis includes chronic pain, cholangitis, anemia   Co morbidities that complicate  the patient evaluation  r htn, copd, cad, chf, afib (on eliquis ), depression, anxiety, breast cancer with mets to bone, left renal cell cancer, acute cholangitis and new liver mass   Additional history obtained:  Additional history obtained from epic chart review External records from outside source obtained and reviewed including EMS report   Lab Tests:  I Ordered, and personally interpreted labs.  The pertinent results include:  cbc with hgb low at 6.6 (hgb 7.2 on 5/30), cmp with na low at 132 and LFTs improved.  Bili down to 2.2 (2.9 on 5/22)   Imaging Studies ordered:  I ordered imaging studies including ct abd/pelvis  I independently visualized and interpreted imaging which showed  Palliative metallic biliary stent again noted within the  extrahepatic bile duct. Pneumobilia within the non dependent  intrahepatic biliary tree suggest patency of the stented segment.  2. Previously noted hepatic hypodensities are not as well visualized  on current examination suggesting that these may have reflected  hepatic abscesses or intrahepatic bilomas and have resolved in the  interval. No enhancing intrahepatic mass is identified.  3. 2.7 cm heterogeneously enhancing solid mass within the pole the  left kidney compatible with known renal cell carcinoma is unchanged.  4. Pathologic compression fracture of L1 is again seen with  comminution of the vertebral body and retropulsion of the posterior  body fracture fragment by up to 10 mm with resultant severe central  canal stenosis with a residual AP diameter of the spinal canal of  approximately 3-4 mm. Severe resultant bilateral neuroforaminal  narrowing at T12-L1 and L1-L2. Surgical consultation may be helpful  for further management.  5. Stable penetrating atherosclerotic ulcer versus short segment  focal dissection measuring 1.5 x 2.5 x 0.9 cm in greatest dimension.  6. Moderate colonic stool burden. No evidence of obstruction or   focal inflammation.    Aortic Atherosclerosis (ICD10-I70.0).   I agree with  the radiologist interpretation   Cardiac Monitoring:  The patient was maintained on a cardiac monitor.  I personally viewed and interpreted the cardiac monitored which showed an underlying rhythm of: nsr   Medicines ordered and prescription drug management:  I ordered medication including dilaudid /zofran   for sx  Reevaluation of the patient after these medicines showed that the patient improved I have reviewed the patients home medicines and have made adjustments as needed   Test Considered:  ct   Critical Interventions:  Pain control   Problem List / ED Course:  Symptomatic anemia:  pt had not wanted a transfusion during the last admission, but is willing to have one now.  Hgb has been in the low 7, upper 6 range during last admission.  No active bleeding. She is transfused 1 unit.  Liver mass:  Things look better on CT.  Possibly due to resolving abscesses.  pt is due for bx tomorrow at 1100.  EMS will get her at her home at 0900. Right sided pain:  acute on chronic.  Pt does have a pain pump which is working.  She likely has worsening pain as she's been hospitalized so much lately and has been lying in bed a lot.  Pain has improved after iv dilaudid .     Reevaluation:  After the interventions noted above, I reevaluated the patient and found that they have :improved   Social Determinants of Health:  Lives at home with son   Dispostion:  After consideration of the diagnostic results and the patients response to treatment, I feel that the patent would benefit from discharge with outpatient f/u.    CRITICAL CARE Performed by: Sueellen Emery   Total critical care time: 30 minutes  Critical care time was exclusive of separately billable procedures and treating other patients.  Critical care was necessary to treat or prevent imminent or life-threatening deterioration.  Critical care  was time spent personally by me on the following activities: development of treatment plan with patient and/or surrogate as well as nursing, discussions with consultants, evaluation of patient's response to treatment, examination of patient, obtaining history from patient or surrogate, ordering and performing treatments and interventions, ordering and review of laboratory studies, ordering and review of radiographic studies, pulse oximetry and re-evaluation of patient's condition.         Final Clinical Impression(s) / ED Diagnoses Final diagnoses:  Symptomatic anemia  Metastatic cancer to bone (HCC)  Constipation, unspecified constipation type    Rx / DC Orders ED Discharge Orders     None         Sueellen Emery, MD 05/01/24 2221

## 2024-05-01 NOTE — ED Triage Notes (Signed)
 Pt has breast cancer with metastasis to bone and possible liver.  Pt has severe pain to her entire right side from her neck to her fingers and toes.

## 2024-05-02 ENCOUNTER — Ambulatory Visit
Admission: RE | Admit: 2024-05-02 | Discharge: 2024-05-02 | Disposition: A | Discharge status: Home / Self Care | Source: Ambulatory Visit | Attending: Oncology | Admitting: Oncology

## 2024-05-02 LAB — BPAM RBC
Blood Product Expiration Date: 202506282359
ISSUE DATE / TIME: 202506012008
Unit Type and Rh: 6200

## 2024-05-02 LAB — TYPE AND SCREEN
ABO/RH(D): A POS
Antibody Screen: NEGATIVE
Unit division: 0

## 2024-05-02 NOTE — ED Notes (Signed)
 Pt given ginger ale per request. Also repositioned in bed at this time. Visitor remains at bedside. No other needs voiced at this time.

## 2024-05-03 ENCOUNTER — Inpatient Hospital Stay

## 2024-05-03 ENCOUNTER — Ambulatory Visit (HOSPITAL_COMMUNITY)
Admission: RE | Admit: 2024-05-03 | Discharge: 2024-05-03 | Disposition: A | Discharge status: Home / Self Care | Source: Ambulatory Visit | Attending: Hematology | Admitting: Hematology

## 2024-05-03 ENCOUNTER — Inpatient Hospital Stay: Admitting: Hematology

## 2024-05-03 ENCOUNTER — Inpatient Hospital Stay: Attending: Hematology

## 2024-05-03 ENCOUNTER — Inpatient Hospital Stay (HOSPITAL_BASED_OUTPATIENT_CLINIC_OR_DEPARTMENT_OTHER): Admitting: Hematology

## 2024-05-03 VITALS — BP 128/67 | HR 107 | Temp 98.3°F | Resp 18

## 2024-05-03 DIAGNOSIS — Z5111 Encounter for antineoplastic chemotherapy: Secondary | ICD-10-CM | POA: Insufficient documentation

## 2024-05-03 DIAGNOSIS — M25511 Pain in right shoulder: Secondary | ICD-10-CM | POA: Insufficient documentation

## 2024-05-03 DIAGNOSIS — Z170 Estrogen receptor positive status [ER+]: Secondary | ICD-10-CM | POA: Diagnosis not present

## 2024-05-03 DIAGNOSIS — Z1732 Human epidermal growth factor receptor 2 negative status: Secondary | ICD-10-CM | POA: Insufficient documentation

## 2024-05-03 DIAGNOSIS — Z1721 Progesterone receptor positive status: Secondary | ICD-10-CM | POA: Diagnosis not present

## 2024-05-03 DIAGNOSIS — C7951 Secondary malignant neoplasm of bone: Secondary | ICD-10-CM | POA: Insufficient documentation

## 2024-05-03 DIAGNOSIS — C50911 Malignant neoplasm of unspecified site of right female breast: Secondary | ICD-10-CM

## 2024-05-03 DIAGNOSIS — Z79899 Other long term (current) drug therapy: Secondary | ICD-10-CM | POA: Insufficient documentation

## 2024-05-03 DIAGNOSIS — C773 Secondary and unspecified malignant neoplasm of axilla and upper limb lymph nodes: Secondary | ICD-10-CM | POA: Diagnosis not present

## 2024-05-03 DIAGNOSIS — C50411 Malignant neoplasm of upper-outer quadrant of right female breast: Secondary | ICD-10-CM | POA: Diagnosis present

## 2024-05-03 LAB — COMPREHENSIVE METABOLIC PANEL WITH GFR
ALT: 27 U/L (ref 0–44)
AST: 86 U/L — ABNORMAL HIGH (ref 15–41)
Albumin: 1.9 g/dL — ABNORMAL LOW (ref 3.5–5.0)
Alkaline Phosphatase: 599 U/L — ABNORMAL HIGH (ref 38–126)
Anion gap: 7 (ref 5–15)
BUN: 19 mg/dL (ref 8–23)
CO2: 27 mmol/L (ref 22–32)
Calcium: 8.7 mg/dL — ABNORMAL LOW (ref 8.9–10.3)
Chloride: 94 mmol/L — ABNORMAL LOW (ref 98–111)
Creatinine, Ser: 0.57 mg/dL (ref 0.44–1.00)
GFR, Estimated: >60 mL/min (ref >60)
Glucose, Bld: 131 mg/dL — ABNORMAL HIGH (ref 70–99)
Potassium: 3.5 mmol/L (ref 3.5–5.1)
Sodium: 128 mmol/L — ABNORMAL LOW (ref 135–145)
Total Bilirubin: 2.5 mg/dL — ABNORMAL HIGH (ref 0.0–1.2)
Total Protein: 6.7 g/dL (ref 6.5–8.1)

## 2024-05-03 MED ORDER — DENOSUMAB 120 MG/1.7ML ~~LOC~~ SOLN
120.0000 mg | Freq: Once | SUBCUTANEOUS | Status: AC
  Administered 2024-05-03: 120 mg via SUBCUTANEOUS
  Filled 2024-05-03: qty 1.7

## 2024-05-03 MED ORDER — FULVESTRANT 250 MG/5ML IM SOSY
500.0000 mg | PREFILLED_SYRINGE | Freq: Once | INTRAMUSCULAR | Status: AC
  Administered 2024-05-03: 500 mg via INTRAMUSCULAR
  Filled 2024-05-03: qty 10

## 2024-05-03 NOTE — Patient Instructions (Signed)
 [facility] [place] - A DEPT OF [facility]  Discharge Instructions: Thank you for choosing [facility] to provide your oncology and hematology care.  If you have a lab appointment with the Cancer Center - please note that after April 8th, 2024, all labs will be drawn in the cancer center.  You do not have to check in or register with the main entrance as you have in the past but will complete your check-in in the cancer center.  Wear comfortable clothing and clothing appropriate for easy access to any Portacath or PICC line.   We strive to give you quality time with your provider. You may need to reschedule your appointment if you arrive late (15 or more minutes).  Arriving late affects you and other patients whose appointments are after yours.  Also, if you miss three or more appointments without notifying the office, you may be dismissed from the clinic at the provider's discretion.      For prescription refill requests, have your pharmacy contact our office and allow 72 hours for refills to be completed.    Today you received the following Faslodex  and Xgeva , return as scheduled.   To help prevent nausea and vomiting after your treatment, we encourage you to take your nausea medication as directed.  BELOW ARE SYMPTOMS THAT SHOULD BE REPORTED IMMEDIATELY: *FEVER GREATER THAN 100.4 F (38 C) OR HIGHER *CHILLS OR SWEATING *NAUSEA AND VOMITING THAT IS NOT CONTROLLED WITH YOUR NAUSEA MEDICATION *UNUSUAL SHORTNESS OF BREATH *UNUSUAL BRUISING OR BLEEDING *URINARY PROBLEMS (pain or burning when urinating, or frequent urination) *BOWEL PROBLEMS (unusual diarrhea, constipation, pain near the anus) TENDERNESS IN MOUTH AND THROAT WITH OR WITHOUT PRESENCE OF ULCERS (sore throat, sores in mouth, or a toothache) UNUSUAL RASH, SWELLING OR PAIN  UNUSUAL VAGINAL DISCHARGE OR ITCHING   Items with * indicate a potential emergency and should be followed up as soon as  possible or go to the Emergency Department if any problems should occur.  Please show the CHEMOTHERAPY ALERT CARD or IMMUNOTHERAPY ALERT CARD at check-in to the Emergency Department and triage nurse.  Should you have questions after your visit or need to cancel or reschedule your appointment, please contact [facility] [place] - A DEPT OF [facility] [phone]  and follow the prompts.  Office hours are 8:00 a.m. to 4:30 p.m. Monday - Friday. Please note that voicemails left after 4:00 p.m. may not be returned until the following business day.  We are closed weekends and major holidays. You have access to a nurse at all times for urgent questions. Please call the main number to the clinic [phone] and follow the prompts.  For any non-urgent questions, you may also contact your provider using MyChart. We now offer e-Visits for anyone 16 and older to request care online for non-urgent symptoms. For details visit [email or web address].   Also download the MyChart app! Go to the app store, search "MyChart", open the app, select [facility], and log in with your MyChart username and password.

## 2024-05-03 NOTE — Progress Notes (Signed)
 [facility] [address] [place] [address]    Clinic Day:  05/03/24   Referring physician: [name], MD  Patient Care Team: [name], MD as PCP - General (Internal Medicine) Amanda Jungling Joyceann No, MD as PCP - Cardiology (Cardiology) Gerhard Knuckles, RN as Oncology Nurse Navigator (Oncology)   ASSESSMENT & PLAN:   Assessment: 1.  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 -Ultrasound-guided biopsy on 01/07/2016 showed grade 1 invasive ductal carcinoma, ER positive, PR positive, HER-2 equivocal by FISH, right axillary lymph node with metastatic adenocarcinoma.  HER-2 equivocal since her 2/CEP17 ratio less than 2 and average copy number of her 2 is greater than or equal to 4 but less than 6 signals per cell. -Bone biopsy on 05/30/2016 consistent with metastatic breast cancer. -Palliative therapy with letrozole  started under the direction of Dr. Clarance Cristal at [facility] from 02/25/2016 through February 2020, progressive symptoms with increased soft tissue extension at L2. -She was transitioned to fulvestrant  in February 2020. -Addition of Anibal Kent was discussed but she was concerned about cost of the medication. -CT abdomen and pelvis on 08/13/2020 shows acute compression fracture deformity of the L1 vertebral body and other chronic findings. -Her last Faslodex  injection at [facility] was on 11/06/2020. -CT CAP on 02/19/2021 with no evidence of for solid organ metastasis or nodal metastasis.  Right breast lesion is stable.  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 -Bone scan on 02/19/2021 was negative for bone mets.   2.  Social/family history: -She worked for [employer] prior to retirement in 2010. -She is  currently living with her son who is helping with her day-to-day activities.  Daughter lives in [place] .  She quit smoking 35 to 40 years ago. -She is mostly confined to bed.  However she is able to change clothes, able to wash upper body, halfway dress herself.  She cannot stand up for the past 2 years because of weakness in the legs and knee arthritis.  She wears adult diapers.  Plan: 1.  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 - She was recently hospitalized twice, first time from 03/09/2024 through 03/18/2024 with jaundice and underwent ERCP. - She was again admitted from 04/14/2024 through 04/25/2024 with ascending cholangitis.  CT scan at that time showed possible liver lesions.  She was scheduled for liver biopsy.  However she presented to the ER yesterday with right-sided pain.  She received blood transfusion for hemoglobin of 6.6.  A CTAP on 05/01/2024 showed metallic biliary stent within the extrahepatic bile duct.  Previously noted hepatic hypodensities are not visualized as well on the current exam suggesting they may be reflected hepatic abscesses or intrahepatic biloma's which have resolved in the interval.  2.7 cm solid mass in the left kidney is stable.  Pathologic compression fracture at L1 is again noted. - She complained of pain on the right side of the body.  There is prominence of the right shoulder.  We have done x-ray in the office which did not show any dislocation. - She will receive her Faslodex  today.  RTC 3 months for follow-up with repeat restaging CT CAP and  tumor markers.     2.  Cancer associated pain/RLQ pain: - She follows up at [facility] pain clinic and has morphine /bupivacaine  intrathecal pump.  3.  Left kidney upper pole mass: - Current scan showed stable left upper pole kidney mass measuring 2.7 cm on 05/01/2024.  4.  Bone metastasis: - Calcium  level is 8.7 with albumin  1.9.  She may  proceed with denosumab  today.  5.  Pulmonary embolism (Dx 03/16/2023): - CT scan done at [name] showed nonocclusive pulmonary embolism in the left upper lobe segmental artery.  Continue Eliquis  twice daily indefinitely.  Orders Placed This Encounter  Procedures   DG Shoulder Right Port    Right shoulder 2 view; [facility] room 404    Standing Status:   Future    Number of Occurrences:   1    Expected Date:   05/03/2024    Expiration Date:   05/03/2025    Reason for Exam (SYMPTOM  OR DIAGNOSIS REQUIRED):   right shoulder pain/deformity    Preferred imaging location?:   [facility] [name]   CT CHEST ABDOMEN PELVIS W CONTRAST    Standing Status:   Future    Expected Date:   08/03/2024    Expiration Date:   05/03/2025    If indicated for the ordered procedure, I authorize the administration of contrast media per Radiology protocol:   Yes    Does the patient have a contrast media/X-ray dye allergy?:   No    Preferred imaging location?:   [facility], [name]    If indicated for the ordered procedure, I authorize the administration of oral contrast media per Radiology protocol:   Yes   CBC with Differential    Standing Status:   Future    Expected Date:   08/01/2024    Expiration Date:   05/03/2025   Comprehensive metabolic panel    Standing Status:   Future    Expected Date:   08/01/2024    Expiration Date:   05/03/2025   Cancer antigen 15-3    Standing Status:   Future    Expected Date:   08/01/2024    Expiration Date:   05/03/2025      Stacey [name],acting as a scribe for Paulett Boros, MD.,have documented all relevant documentation on the behalf of Paulett Boros, MD,as directed by  Paulett Boros, MD while in the presence of Paulett Boros, MD.  I, Paulett Boros MD, have reviewed the above documentation for accuracy and completeness, and I agree with the above.     Paulett Boros, MD   6/3/20252:36 PM  CHIEF COMPLAINT:   Diagnosis: metastatic right  breast cancer to the bones    Cancer Staging  Primary malignant neoplasm of breast with metastasis [name]) Staging form: Breast, AJCC 8th Edition - Clinical stage from 02/07/2021: Stage IV (cT2, cN1, pM1, G1, ER+, PR+, HER2: Equivocal) - Signed by Paulett Boros, MD on 02/07/2021    Prior Therapy:  Palliative therapy with letrozole  started under the direction of Dr. Clarance Cristal at [facility] from 02/25/2016 through February 2020   Current Therapy:  She was transitioned to fulvestrant  in February 2020- still receiving monthly Faslodex    HISTORY OF PRESENT ILLNESS:   Oncology History  Primary malignant neoplasm of breast with metastasis (HCC)  11/23/2017 Initial Diagnosis   Metastatic breast cancer (HCC)   02/07/2021 Cancer Staging   Staging form: Breast, AJCC 8th Edition - Clinical stage from 02/07/2021: Stage IV (cT2, cN1, pM1, G1, ER+, PR+, HER2: Equivocal) -  Signed by Paulett Boros, MD on 02/07/2021 Histopathologic type: Infiltrating duct carcinoma, NOS Stage prefix: Initial diagnosis Histologic grading system: 3 grade system   02/07/2021 - 02/07/2021 Chemotherapy            INTERVAL HISTORY:   Stacey [name]  is a 78 y.o. female presenting to clinic today for follow up of metastatic right breast cancer to the bones. She was last seen by me on 01/27/24.  Since her last visit, she was admitted to the hospital from 03/09/24 to 03/18/24 for acute cholangitis. She was treated with IV fluids, Zosyn , antibiotics, and Dilaudid . She ERCP on 4/9 and had a stent placed due to obstruction. She also went to the ED on 04/12/24 and was diagnosed with a liver mass at that time. She was admitted to the hospital again from 04/14/24 to 04/24/24 Stacey [name]  presented to the ED on 05/01/24 for anemia and was transfused 1 unit of PRBC's.   Today, she states that she is doing well overall. Her appetite level is at 80%. Her energy level is at 25%. Priscille  reports pain in the neck and the right side of the body  that is debilitating and began 2 weeks ago. She is unable to move the right arm due to the pain and cannot move the right leg. She appears to have a right shoulder dislocation.   She currently lives at home with her son.   PAST MEDICAL HISTORY:   Past Medical History: Past Medical History:  Diagnosis Date   CAD in native artery    a. DES to ramus 2005 with late stent thrombosis 2006 tx with PTCA. 12/18 PCI/DES x1 to mRCA, EF 50-55%   CHF (congestive heart failure) (HCC)    Chronic pain    COPD (chronic obstructive pulmonary disease) (HCC)    Hyperlipidemia    Hypertension    Left bundle branch block    Lymphedema    Metastatic breast cancer    a. to bone.   MI (myocardial infarction) (HCC)    Mild aortic stenosis 10/2017   Morbid obesity (HCC)    PAF (paroxysmal atrial fibrillation) (HCC)    PSVT (paroxysmal supraventricular tachycardia) (HCC)    a. per [facility] notes, seen on event monitor in 2014.   Pulmonary nodules     Surgical History: Past Surgical History:  Procedure Laterality Date   BILIARY STENT PLACEMENT  03/09/2024   Procedure: INSERTION, STENT, BILE DUCT;  Surgeon: Marnee Sink, MD;  Location: [facility] ENDOSCOPY;  Service: Endoscopy;;   CHOLECYSTECTOMY N/A 03/29/2021   Procedure: LAPAROSCOPIC CHOLECYSTECTOMY;  Surgeon: Awilda Bogus, MD;  Location: [facility] ORS;  Service: General;  Laterality: N/A;   CORONARY STENT INTERVENTION N/A 11/26/2017   Procedure: CORONARY STENT INTERVENTION;  Surgeon: Swaziland, Peter M, MD;  Location: [facility] INVASIVE CV LAB;  Service: Cardiovascular;  Laterality: N/A;   CORONARY STENT PLACEMENT     ERCP N/A 03/28/2021   Procedure: ENDOSCOPIC RETROGRADE CHOLANGIOPANCREATOGRAPHY (ERCP);  Surgeon: Ruby Corporal, MD;  Location: [facility] ORS;  Service: Endoscopy;  Laterality: N/A;   ERCP N/A 03/09/2024   Procedure: ERCP, WITH INTERVENTION IF INDICATED;  Surgeon: Marnee Sink, MD;  Location: [facility] ENDOSCOPY;  Service: Endoscopy;  Laterality: N/A;   ERCP N/A  04/15/2024   Procedure: ERCP, WITH INTERVENTION IF INDICATED;  Surgeon: Marnee Sink, MD;  Location: [facility] ENDOSCOPY;  Service: Endoscopy;  Laterality: N/A;   ESOPHAGOGASTRODUODENOSCOPY  03/09/2024   Procedure: EGD (ESOPHAGOGASTRODUODENOSCOPY);  Surgeon: Marnee Sink, MD;  Location: [facility] ENDOSCOPY;  Service: Endoscopy;;   intrathecal  pain pump     LEFT HEART CATH AND CORONARY ANGIOGRAPHY N/A 11/26/2017   Procedure: LEFT HEART CATH AND CORONARY ANGIOGRAPHY;  Surgeon: Swaziland, Peter M, MD;  Location: [facility] INVASIVE CV LAB;  Service: Cardiovascular;  Laterality: N/A;   REMOVAL OF STONES  03/28/2021   Procedure: REMOVAL OF STONES;  Surgeon: Ruby Corporal, MD;  Location: [facility]S;  Service: Endoscopy;;   SPHINCTEROTOMY  03/28/2021   Procedure: SPHINCTEROTOMY;  Surgeon: Ruby Corporal, MD;  Location: [facility]S;  Service: Endoscopy;;   UMBILICAL HERNIA REPAIR N/A 04/03/2021   Procedure: ADULT PRIMARY UMBILICAL HERNIA REPAIR;  Surgeon: Awilda Bogus, MD;  Location: [facility] ORS;  Service: General;  Laterality: N/A;   VASCULAR SURGERY      Social History: Social History   Socioeconomic History   Marital status: Single    Spouse name: Not on file   Number of children: Not on file   Years of education: Not on file   Highest education level: Not on file  Occupational History   Occupation: Retired  Tobacco Use   Smoking status: Former    Current packs/day: 0.00    Average packs/day: 2.0 packs/day for 18.0 years (36.0 ttl pk-yrs)    Types: Cigarettes    Start date: 12/01/1962    Quit date: 12/01/1980    Years since quitting: 43.4   Smokeless tobacco: Never  Vaping Use   Vaping status: Never Used  Substance and Sexual Activity   Alcohol  use: No   Drug use: Never   Sexual activity: Not Currently  Other Topics Concern   Not on file  Social History Narrative   Not on file   Social Drivers of Health   Financial Resource Strain: High Risk (01/04/2024)   Received from [facility]    Overall Financial Resource Strain (CARDIA)    Difficulty of Paying Living Expenses: Hard  Food Insecurity: No Food Insecurity (04/14/2024)   Hunger Vital Sign    Worried About Running Out of Food in the Last Year: Never true    Ran Out of Food in the Last Year: Never true  Transportation Needs: No Transportation Needs (04/14/2024)   PRAPARE - Administrator, Civil Service (Medical): No    Lack of Transportation (Non-Medical): No  Physical Activity: Inactive (08/17/2023)   Received from [facility]   Exercise Vital Sign    Days of Exercise per Week: 0 days    Minutes of Exercise per Session: 0 min  Stress: No Stress Concern Present (08/17/2023)   Received from [facility] - Occupational Stress Questionnaire    Feeling of Stress : Only a little  Social Connections: Unknown (04/14/2024)   Social Connection and Isolation Panel [NHANES]    Frequency of Communication with Friends and Family: More than three times a week    Frequency of Social Gatherings with Friends and Family: More than three times a week    Attends Religious Services: More than 4 times per year    Active Member of [employer] or Organizations: No    Attends Engineer, structural: More than 4 times per year    Marital Status: Patient declined  Recent Concern: Social Connections - Socially Isolated (03/09/2024)   Social Connection and Isolation Panel [NHANES]    Frequency of Communication with Friends and Family: More than three times a week    Frequency of Social Gatherings with Friends and Family: More  than three times a week    Attends Religious Services: Never    Active Member of Clubs or Organizations: No    Attends Banker Meetings: Never    Marital Status: Widowed  Intimate Partner Violence: Not At Risk (04/14/2024)   Humiliation, Afraid, Rape, and Kick questionnaire    Fear of Current or Ex-Partner: No     Emotionally Abused: No    Physically Abused: No    Sexually Abused: No    Family History: Family History  Problem Relation Age of Onset   CAD Father 15   Heart attack Father    COPD Sister    CAD Paternal Grandmother    Sudden Cardiac Death Neg Hx    Colon polyps Neg Hx    Colon cancer Neg Hx     Current Medications:  Current Outpatient Medications:    amLODipine  (NORVASC ) 5 MG tablet, TAKE 1 TABLET EVERY DAY, Disp: 90 tablet, Rfl: 3   amoxicillin -clavulanate (AUGMENTIN ) 875-125 MG tablet, Take 1 tablet by mouth 2 (two) times daily for 21 days., Disp: 42 tablet, Rfl: 0   [Paused] apixaban  (ELIQUIS ) 2.5 MG TABS tablet, Take 2.5 mg by mouth 2 (two) times daily., Disp: , Rfl:    b complex vitamins capsule, Take 1 capsule by mouth daily., Disp: , Rfl:    carboxymethylcellulose (REFRESH PLUS) 0.5 % SOLN, Place 1 drop into both eyes 3 (three) times daily as needed (Dry Eyes)., Disp: , Rfl:    [Paused] clopidogrel  (PLAVIX ) 75 MG tablet, Take 75 mg by mouth daily., Disp: , Rfl:    diclofenac  Sodium (VOLTAREN ) 1 % GEL, Apply topically., Disp: , Rfl:    DULoxetine  (CYMBALTA ) 30 MG capsule, Take 30 mg by mouth daily., Disp: , Rfl:    ferrous sulfate  325 (65 FE) MG EC tablet, Take 325 mg by mouth 3 (three) times daily with meals., Disp: , Rfl:    HYDROmorphone  (DILAUDID ) 1 MG/ML injection, Inject 0.5 mLs (0.5 mg total) into the vein every 2 (two) hours as needed for severe pain (pain score 7-10) or moderate pain (pain score 4-6)., Disp: , Rfl:    HYDROmorphone  (DILAUDID ) 2 MG tablet, Take 4 mg by mouth every 4 (four) hours as needed for severe pain (pain score 7-10)., Disp: , Rfl:    leptospermum manuka honey (MEDIHONEY) PSTE paste, Apply 1 Application topically daily., Disp: , Rfl:    losartan  (COZAAR ) 100 MG tablet, TAKE 1 TABLET EVERY DAY, Disp: 90 tablet, Rfl: 3   methocarbamol  (ROBAXIN ) 500 MG tablet, Take 500 mg by mouth every 6 (six) hours as needed for muscle spasms., Disp: , Rfl:     nitroGLYCERIN  (NITROSTAT ) 0.4 MG SL tablet, Place 0.4 mg under the tongue every 5 (five) minutes as needed for chest pain., Disp: , Rfl:    ondansetron  (ZOFRAN ) 4 MG/2ML SOLN injection, Inject 2 mLs (4 mg total) into the vein every 6 (six) hours as needed for nausea or vomiting., Disp: , Rfl:    enoxaparin  (LOVENOX ) 100 MG/ML injection, Inject 0.825 mLs (82.5 mg total) into the skin every 12 (twelve) hours for 7 days., Disp: 11.55 mL, Rfl: 0   metoprolol  succinate (TOPROL -XL) 25 MG 24 hr tablet, Take 3 tablets (75 mg total) by mouth daily., Disp: 90 tablet, Rfl: 0   pantoprazole  (PROTONIX ) 40 MG tablet, Take 1 tablet (40 mg total) by mouth 2 (two) times daily., Disp: 60 tablet, Rfl: 0   Allergies: Allergies  Allergen Reactions   Brilinta  [Ticagrelor ] Diarrhea and  Nausea Only    Nausea and severe diarrhea- general weakness. Patient says does not want to take again   Budesonide  Palpitations   Albuterol  Other (See Comments)    Heart racing     REVIEW OF SYSTEMS:   Review of Systems  Constitutional:  Negative for chills, fatigue and fever.  HENT:   Negative for lump/mass, mouth sores, nosebleeds, sore throat and trouble swallowing.   Eyes:  Negative for eye problems.  Respiratory:  Negative for cough and shortness of breath.   Cardiovascular:  Positive for leg swelling. Negative for chest pain and palpitations.  Gastrointestinal:  Negative for abdominal pain, constipation, diarrhea, nausea and vomiting.  Genitourinary:  Negative for bladder incontinence, difficulty urinating, dysuria, frequency, hematuria and nocturia.   Musculoskeletal:  Negative for arthralgias, back pain, flank pain, myalgias and neck pain.       +pain throughout the body, 8/10 severity  Skin:  Negative for itching and rash.  Neurological:  Negative for dizziness, headaches and numbness.  Hematological:  Does not bruise/bleed easily.  Psychiatric/Behavioral:  Positive for sleep disturbance. Negative for depression and  suicidal ideas. The patient is nervous/anxious.   All other systems reviewed and are negative.    VITALS:   Blood pressure 128/67, pulse (!) 107, temperature 98.3 F (36.8 C), temperature source Tympanic, resp. rate 18, SpO2 95%.  Wt Readings from Last 3 Encounters:  05/01/24 179 lb 3.7 oz (81.3 kg)  04/29/24 179 lb 3.7 oz (81.3 kg)  04/24/24 179 lb 3.7 oz (81.3 kg)    There is no height or weight on file to calculate BMI.  Performance status (ECOG): 4 - Bedbound  PHYSICAL EXAM:   Physical Exam Vitals and nursing note reviewed. Exam conducted with a chaperone present.  Constitutional:      Appearance: Normal appearance.  Cardiovascular:     Rate and Rhythm: Normal rate and regular rhythm.     Pulses: Normal pulses.     Heart sounds: Normal heart sounds.  Pulmonary:     Effort: Pulmonary effort is normal.     Breath sounds: Normal breath sounds.  Abdominal:     Palpations: Abdomen is soft. There is no hepatomegaly, splenomegaly or mass.     Tenderness: There is no abdominal tenderness.  Musculoskeletal:     Right lower leg: No edema.     Left lower leg: No edema.  Lymphadenopathy:     Cervical: No cervical adenopathy.     Right cervical: No superficial, deep or posterior cervical adenopathy.    Left cervical: No superficial, deep or posterior cervical adenopathy.     Upper Body:     Right upper body: No supraclavicular or axillary adenopathy.     Left upper body: No supraclavicular or axillary adenopathy.  Neurological:     General: No focal deficit present.     Mental Status: She is alert and oriented to person, place, and time.  Psychiatric:        Mood and Affect: Mood normal.        Behavior: Behavior normal.    Breast Exam Chaperone: Reed Canes, LPN   LABS:      Latest Ref Rng & Units 05/01/2024    3:34 PM 04/29/2024   10:40 AM 04/24/2024    5:12 AM  CBC  WBC 4.0 - 10.5 K/uL 6.7  4.6  7.4   Hemoglobin 12.0 - 15.0 g/dL 6.6  7.2  6.6   Hematocrit 36.0  - 46.0 % 21.3  22.5  21.1   Platelets 150 - 400 K/uL 270  220  257       Latest Ref Rng & Units 05/03/2024   10:06 AM 05/01/2024    3:34 PM 04/24/2024    5:12 AM  CMP  Glucose 70 - 99 mg/dL 621  308  657   BUN 8 - 23 mg/dL 19  17  7    Creatinine 0.44 - 1.00 mg/dL 8.46  9.62  9.52   Sodium 135 - 145 mmol/L 128  132  139   Potassium 3.5 - 5.1 mmol/L 3.5  3.8  3.9   Chloride 98 - 111 mmol/L 94  97  106   CO2 22 - 32 mmol/L 27  27  24    Calcium  8.9 - 10.3 mg/dL 8.7  9.1  8.1   Total Protein 6.5 - 8.1 g/dL 6.7  6.5    Total Bilirubin 0.0 - 1.2 mg/dL 2.5  2.2    Alkaline Phos 38 - 126 U/L 599  598    AST 15 - 41 U/L 86  65    ALT 0 - 44 U/L 27  24     Component     Latest Ref Rng 09/09/2022 12/08/2022 03/02/2023  CA 15-3     0.0 - 25.0 U/mL 18.0  19.8  18.0     Component     Latest Ref Rng 09/09/2022 12/08/2022 03/02/2023  CA 27.29     0.0 - 38.6 U/mL 21.2  17.9  15.3     No results found for: "CEA1", "CEA" / No results found for: "CEA1", "CEA" No results found for: "PSA1" No results found for: "WUX324" No results found for: "CAN125"  No results found for: "TOTALPROTELP", "ALBUMINELP", "A1GS", "A2GS", "BETS", "BETA2SER", "GAMS", "MSPIKE", "SPEI" Lab Results  Component Value Date   TIBC 147 (L) 03/13/2024   TIBC 273 09/09/2022   TIBC 289 06/09/2022   FERRITIN 1,285 (H) 03/13/2024   FERRITIN 47 09/09/2022   FERRITIN 36 06/09/2022   IRONPCTSAT 49 (H) 03/13/2024   IRONPCTSAT 25 09/09/2022   IRONPCTSAT 23 06/09/2022   Lab Results  Component Value Date   LDH 226 (H) 04/14/2024     STUDIES:   DG Shoulder Right Port Result Date: 05/03/2024 CLINICAL DATA:  Right shoulder pain and deformity. Right shoulder pain for several weeks. No injury. Patient is an inpatient receiving cancer treatment. EXAM: RIGHT SHOULDER - 1 VIEW COMPARISON:  None Available. FINDINGS: No evidence of acute fracture or dislocation. Mild increase in subacromial space may indicate effusion. No focal bone lesion or  bone destruction. Coracoclavicular and acromioclavicular spaces are normal. Soft tissues are unremarkable. IMPRESSION: No acute bony abnormalities. Mild increase of subacromial space may indicate effusion. Electronically Signed   By: Boyce Byes M.D.   On: 05/03/2024 12:35   CT ABDOMEN PELVIS W CONTRAST Result Date: 05/01/2024 CLINICAL DATA:  Acute nonlocalized abdominal pain EXAM: CT ABDOMEN AND PELVIS WITH CONTRAST TECHNIQUE: Multidetector CT imaging of the abdomen and pelvis was performed using the standard protocol following bolus administration of intravenous contrast. RADIATION DOSE REDUCTION: This exam was performed according to the departmental dose-optimization program which includes automated exposure control, adjustment of the mA and/or kV according to patient size and/or use of iterative reconstruction technique. CONTRAST:  OMNIPAQUE  IOHEXOL  300 MG/ML  SOLN COMPARISON:  04/19/2024 FINDINGS: Lower chest: No acute abnormality. Extensive coronary artery calcification Hepatobiliary: Palliative metallic biliary stent again noted within the extrahepatic bile duct. Pneumobilia within the non dependent intrahepatic biliary tree  suggest patency of the stented segment. Status post cholecystectomy. Previously noted hepatic hypodensities are not as well visualized on current examination suggesting that these may have reflected hepatic abscesses or intrahepatic bilomas and have resolved in the interval. No enhancing intrahepatic mass is identified. Pancreas: Unremarkable Spleen: Unremarkable Adrenals/Urinary Tract: The adrenal glands are unremarkable. The kidneys are normal in size and position. 2.7 cm heterogeneously enhancing solid mass within the pole the left kidney compatible with known renal cell carcinoma is unchanged. Additional simple cortical cysts are seen within the kidneys bilaterally. The kidneys are otherwise unremarkable. Bladder is unremarkable. Stomach/Bowel: Moderate colonic stool  burden. Stomach, small bowel, and large bowel are otherwise unremarkable. No evidence of obstruction or focal inflammation. Appendix is absent. No free intraperitoneal gas or fluid. Vascular/Lymphatic: Extensive aortoiliac atherosclerotic calcification. Stable penetrating atherosclerotic ulcer versus short segment focal dissection measuring 1.5 x 2.5 x 0.9 cm in greatest dimension. No aortic aneurysm. No pathologic adenopathy within the abdomen and pelvis. Reproductive: Status post hysterectomy. No adnexal masses. Other: Implanted pump device noted within the subcutaneous right lower quadrant anterior abdominal wall. No abdominal wall hernia. Musculoskeletal: Pathologic compression fracture of L1 is again seen with comminution of the vertebral body and retropulsion of the posterior body fracture fragment by up to 10 mm with resultant severe central canal stenosis with a residual AP diameter of the spinal canal of approximately 3-4 mm (29/2) severe resultant bilateral neuroforaminal narrowing at T12-L1 and L1-L2. Superimposed degenerative changes noted throughout the thoracolumbar spine. IMPRESSION: 1. Palliative metallic biliary stent again noted within the extrahepatic bile duct. Pneumobilia within the non dependent intrahepatic biliary tree suggest patency of the stented segment. 2. Previously noted hepatic hypodensities are not as well visualized on current examination suggesting that these may have reflected hepatic abscesses or intrahepatic bilomas and have resolved in the interval. No enhancing intrahepatic mass is identified. 3. 2.7 cm heterogeneously enhancing solid mass within the pole the left kidney compatible with known renal cell carcinoma is unchanged. 4. Pathologic compression fracture of L1 is again seen with comminution of the vertebral body and retropulsion of the posterior body fracture fragment by up to 10 mm with resultant severe central canal stenosis with a residual AP diameter of the spinal  canal of approximately 3-4 mm. Severe resultant bilateral neuroforaminal narrowing at T12-L1 and L1-L2. Surgical consultation may be helpful for further management. 5. Stable penetrating atherosclerotic ulcer versus short segment focal dissection measuring 1.5 x 2.5 x 0.9 cm in greatest dimension. 6. Moderate colonic stool burden. No evidence of obstruction or focal inflammation. Aortic Atherosclerosis (ICD10-I70.0). Electronically Signed   By: Worthy Heads M.D.   On: 05/01/2024 19:30   CT ABDOMEN PELVIS WO CONTRAST Result Date: 04/19/2024 CLINICAL DATA:  Abdominal pain, acute, nonlocalized EXAM: CT ABDOMEN AND PELVIS WITHOUT CONTRAST TECHNIQUE: Multidetector CT imaging of the abdomen and pelvis was performed following the standard protocol without IV contrast. RADIATION DOSE REDUCTION: This exam was performed according to the departmental dose-optimization program which includes automated exposure control, adjustment of the mA and/or kV according to patient size and/or use of iterative reconstruction technique. COMPARISON:  March 08, 2024 FINDINGS: Of note, the lack of intravenous contrast limits evaluation of the solid organ parenchyma and vascularity. Lower chest: Small bilateral pleural effusions, slightly larger on the left than the right, with bibasilar atelectasis. Coronary artery atherosclerosis. Hepatobiliary: Multiple small hepatic hypodensities have developed in the interim. For example, in the posterior right hepatic dome there is a 1.7 cm hypodense lesion (axial 14).  In the medial right hepatic lobe there is a 1.8 cm lesion (axial 19). Anterior left hepatic lobe lesion measuring 1.8 cm (axial 15). Cholecystectomy. Decompression of the intrahepatic biliary tree with diffuse pneumobilia. Well-positioned metallic biliary duct stent in the common bile duct. No radiopaque choledocholithiasis. Pancreas: No mass or main ductal dilation. No peripancreatic inflammation or fluid collection. Spleen: Normal  size. No mass. Adrenals/Urinary Tract: No adrenal masses. Unchanged thick-walled cystic structure in the left kidney measuring 3.1 x 3 cm. No hydronephrosis or nephrolithiasis. The urinary bladder is completely decompressed. Stomach/Bowel: The stomach is decompressed without focal abnormality. No small bowel wall thickening or inflammation. No small bowel obstruction.Descending and sigmoid colonic diverticulosis. No changes of acute diverticulitis. Vascular/Lymphatic: No aortic aneurysm. Diffuse aortoiliac atherosclerosis. No intraabdominal or pelvic lymphadenopathy. Reproductive: Hysterectomy. No concerning adnexal mass. No free pelvic fluid. Other: No pneumoperitoneum. Small volume ascites predominantly in the left paracolic gutter. Musculoskeletal: No acute fracture or destructive lesion. Unchanged peripherally calcified mass in the lateral right breast. Diffuse osteopenia. Multilevel degenerative disc disease of the spine. Large sclerotic lesion within the L2 vertebral body, unchanged. Severe pathologic compression fracture with retropulsion at L1 with retropulsion and moderate canal narrowing, unchanged. Multilevel degenerative disc disease of the spine. Diffuse anasarca. Infusion device in the right lower quadrant with a catheter extending into the thoracic spine, outside the field of view. IMPRESSION: 1. Interval development of multiple new hepatic hypodensities measuring up to 1.8 cm, worrisome for progressive metastatic disease. 2. Unchanged thick-walled left renal cystic lesion, consistent with renal cell carcinoma. 3. Small bilateral pleural effusions slightly larger on the right than the left. Small volume ascites, predominantly in the left paracolic gutter. Anasarca. This is likely related to the patient's volume status. 4. Unchanged bony metastases in L1 and L2 with severe pathologic compression fracture of L1 and moderate canal narrowing. Electronically Signed   By: Rance Burrows M.D.   On:  04/19/2024 21:13   DG C-Arm 1-60 Min-No Report Result Date: 04/15/2024 Fluoroscopy was utilized by the requesting physician.  No radiographic interpretation.

## 2024-05-03 NOTE — ED Notes (Signed)
 Lovenox  left in room after discharged, meds sent to pharmacy.

## 2024-05-03 NOTE — Progress Notes (Signed)
 Patient okay for Faslodex  and Xgeva  today per Dr. Cheree Cords. Patient taking calcium  as directed. Denied tooth, jaw, and leg pain. No recent or upcoming dental visits. Labs reviewed. Patient tolerated injection with no complaints voiced. See MAR for details. Patient stable during and after injection. Site clean and dry with no bruising or swelling noted. Band aid applied.  Patient tolerated Faslodex  injection with no complaints voiced. Bilateral sites clean and dry with no bruising or swelling noted. Band aids applied. See MAR for details. Patient stable during and after injections. VSS with discharge and left in satisfactory condition with no s/s of distress noted.

## 2024-05-12 ENCOUNTER — Encounter: Payer: Self-pay | Admitting: *Deleted

## 2024-05-15 ENCOUNTER — Emergency Department (HOSPITAL_COMMUNITY)

## 2024-05-15 ENCOUNTER — Other Ambulatory Visit: Payer: Self-pay

## 2024-05-15 ENCOUNTER — Inpatient Hospital Stay (HOSPITAL_COMMUNITY)
Admission: EM | Admit: 2024-05-15 | Discharge: 2024-05-21 | DRG: 384 | Disposition: A | Discharge status: Home Health | Attending: Internal Medicine | Admitting: Internal Medicine

## 2024-05-15 DIAGNOSIS — C7951 Secondary malignant neoplasm of bone: Secondary | ICD-10-CM | POA: Diagnosis present

## 2024-05-15 DIAGNOSIS — Z6834 Body mass index (BMI) 34.0-34.9, adult: Secondary | ICD-10-CM

## 2024-05-15 DIAGNOSIS — N1832 Chronic kidney disease, stage 3b: Secondary | ICD-10-CM | POA: Diagnosis present

## 2024-05-15 DIAGNOSIS — R1013 Epigastric pain: Principal | ICD-10-CM

## 2024-05-15 DIAGNOSIS — I252 Old myocardial infarction: Secondary | ICD-10-CM

## 2024-05-15 DIAGNOSIS — D539 Nutritional anemia, unspecified: Secondary | ICD-10-CM | POA: Diagnosis present

## 2024-05-15 DIAGNOSIS — R109 Unspecified abdominal pain: Secondary | ICD-10-CM | POA: Diagnosis present

## 2024-05-15 DIAGNOSIS — I480 Paroxysmal atrial fibrillation: Secondary | ICD-10-CM | POA: Diagnosis present

## 2024-05-15 DIAGNOSIS — Z955 Presence of coronary angioplasty implant and graft: Secondary | ICD-10-CM

## 2024-05-15 DIAGNOSIS — Z87891 Personal history of nicotine dependence: Secondary | ICD-10-CM

## 2024-05-15 DIAGNOSIS — G893 Neoplasm related pain (acute) (chronic): Secondary | ICD-10-CM | POA: Diagnosis present

## 2024-05-15 DIAGNOSIS — Z66 Do not resuscitate: Secondary | ICD-10-CM | POA: Diagnosis present

## 2024-05-15 DIAGNOSIS — Z888 Allergy status to other drugs, medicaments and biological substances status: Secondary | ICD-10-CM

## 2024-05-15 DIAGNOSIS — C50919 Malignant neoplasm of unspecified site of unspecified female breast: Secondary | ICD-10-CM | POA: Diagnosis present

## 2024-05-15 DIAGNOSIS — Z91138 Patient's unintentional underdosing of medication regimen for other reason: Secondary | ICD-10-CM

## 2024-05-15 DIAGNOSIS — Z79899 Other long term (current) drug therapy: Secondary | ICD-10-CM

## 2024-05-15 DIAGNOSIS — Z9689 Presence of other specified functional implants: Secondary | ICD-10-CM | POA: Diagnosis present

## 2024-05-15 DIAGNOSIS — T50996A Underdosing of other drugs, medicaments and biological substances, initial encounter: Secondary | ICD-10-CM | POA: Diagnosis present

## 2024-05-15 DIAGNOSIS — Y92009 Unspecified place in unspecified non-institutional (private) residence as the place of occurrence of the external cause: Secondary | ICD-10-CM

## 2024-05-15 DIAGNOSIS — F419 Anxiety disorder, unspecified: Secondary | ICD-10-CM | POA: Diagnosis present

## 2024-05-15 DIAGNOSIS — J449 Chronic obstructive pulmonary disease, unspecified: Secondary | ICD-10-CM | POA: Diagnosis present

## 2024-05-15 DIAGNOSIS — K269 Duodenal ulcer, unspecified as acute or chronic, without hemorrhage or perforation: Principal | ICD-10-CM | POA: Diagnosis present

## 2024-05-15 DIAGNOSIS — Z86711 Personal history of pulmonary embolism: Secondary | ICD-10-CM

## 2024-05-15 DIAGNOSIS — I2510 Atherosclerotic heart disease of native coronary artery without angina pectoris: Secondary | ICD-10-CM | POA: Diagnosis present

## 2024-05-15 DIAGNOSIS — R160 Hepatomegaly, not elsewhere classified: Secondary | ICD-10-CM | POA: Diagnosis present

## 2024-05-15 DIAGNOSIS — M4805 Spinal stenosis, thoracolumbar region: Secondary | ICD-10-CM | POA: Diagnosis present

## 2024-05-15 DIAGNOSIS — D7589 Other specified diseases of blood and blood-forming organs: Secondary | ICD-10-CM | POA: Diagnosis present

## 2024-05-15 DIAGNOSIS — E66811 Obesity, class 1: Secondary | ICD-10-CM | POA: Diagnosis present

## 2024-05-15 DIAGNOSIS — E785 Hyperlipidemia, unspecified: Secondary | ICD-10-CM | POA: Diagnosis present

## 2024-05-15 DIAGNOSIS — I482 Chronic atrial fibrillation, unspecified: Secondary | ICD-10-CM | POA: Diagnosis present

## 2024-05-15 DIAGNOSIS — N2889 Other specified disorders of kidney and ureter: Secondary | ICD-10-CM | POA: Insufficient documentation

## 2024-05-15 DIAGNOSIS — C7902 Secondary malignant neoplasm of left kidney and renal pelvis: Secondary | ICD-10-CM | POA: Diagnosis present

## 2024-05-15 DIAGNOSIS — Z1721 Progesterone receptor positive status: Secondary | ICD-10-CM

## 2024-05-15 DIAGNOSIS — R748 Abnormal levels of other serum enzymes: Secondary | ICD-10-CM | POA: Diagnosis present

## 2024-05-15 DIAGNOSIS — Z7401 Bed confinement status: Secondary | ICD-10-CM

## 2024-05-15 DIAGNOSIS — Z9049 Acquired absence of other specified parts of digestive tract: Secondary | ICD-10-CM

## 2024-05-15 DIAGNOSIS — K279 Peptic ulcer, site unspecified, unspecified as acute or chronic, without hemorrhage or perforation: Secondary | ICD-10-CM

## 2024-05-15 DIAGNOSIS — Z7902 Long term (current) use of antithrombotics/antiplatelets: Secondary | ICD-10-CM

## 2024-05-15 DIAGNOSIS — E1122 Type 2 diabetes mellitus with diabetic chronic kidney disease: Secondary | ICD-10-CM | POA: Diagnosis present

## 2024-05-15 DIAGNOSIS — Z5986 Financial insecurity: Secondary | ICD-10-CM

## 2024-05-15 DIAGNOSIS — Z79818 Long term (current) use of other agents affecting estrogen receptors and estrogen levels: Secondary | ICD-10-CM

## 2024-05-15 DIAGNOSIS — T451X5A Adverse effect of antineoplastic and immunosuppressive drugs, initial encounter: Secondary | ICD-10-CM | POA: Diagnosis present

## 2024-05-15 DIAGNOSIS — F32A Depression, unspecified: Secondary | ICD-10-CM | POA: Diagnosis present

## 2024-05-15 DIAGNOSIS — M8458XA Pathological fracture in neoplastic disease, other specified site, initial encounter for fracture: Secondary | ICD-10-CM | POA: Diagnosis present

## 2024-05-15 DIAGNOSIS — E669 Obesity, unspecified: Secondary | ICD-10-CM | POA: Diagnosis present

## 2024-05-15 DIAGNOSIS — L89152 Pressure ulcer of sacral region, stage 2: Secondary | ICD-10-CM | POA: Diagnosis present

## 2024-05-15 DIAGNOSIS — K831 Obstruction of bile duct: Secondary | ICD-10-CM

## 2024-05-15 DIAGNOSIS — Z7901 Long term (current) use of anticoagulants: Secondary | ICD-10-CM

## 2024-05-15 DIAGNOSIS — Z8249 Family history of ischemic heart disease and other diseases of the circulatory system: Secondary | ICD-10-CM

## 2024-05-15 DIAGNOSIS — I129 Hypertensive chronic kidney disease with stage 1 through stage 4 chronic kidney disease, or unspecified chronic kidney disease: Secondary | ICD-10-CM | POA: Diagnosis present

## 2024-05-15 DIAGNOSIS — K5909 Other constipation: Secondary | ICD-10-CM | POA: Diagnosis present

## 2024-05-15 DIAGNOSIS — I5032 Chronic diastolic (congestive) heart failure: Secondary | ICD-10-CM | POA: Diagnosis present

## 2024-05-15 DIAGNOSIS — Z170 Estrogen receptor positive status [ER+]: Secondary | ICD-10-CM

## 2024-05-15 DIAGNOSIS — G8929 Other chronic pain: Secondary | ICD-10-CM | POA: Diagnosis present

## 2024-05-15 DIAGNOSIS — Z825 Family history of asthma and other chronic lower respiratory diseases: Secondary | ICD-10-CM

## 2024-05-15 LAB — CBC WITH DIFFERENTIAL/PLATELET
Abs Immature Granulocytes: 0.01 10*3/uL (ref 0.00–0.07)
Basophils Absolute: 0 10*3/uL (ref 0.0–0.1)
Basophils Relative: 1 %
Eosinophils Absolute: 0.1 10*3/uL (ref 0.0–0.5)
Eosinophils Relative: 2 %
HCT: 29.4 % — ABNORMAL LOW (ref 36.0–46.0)
Hemoglobin: 9.4 g/dL — ABNORMAL LOW (ref 12.0–15.0)
Immature Granulocytes: 0 %
Lymphocytes Relative: 39 %
Lymphs Abs: 1.9 10*3/uL (ref 0.7–4.0)
MCH: 35.1 pg — ABNORMAL HIGH (ref 26.0–34.0)
MCHC: 32 g/dL (ref 30.0–36.0)
MCV: 109.7 fL — ABNORMAL HIGH (ref 80.0–100.0)
Monocytes Absolute: 0.3 10*3/uL (ref 0.1–1.0)
Monocytes Relative: 6 %
Neutro Abs: 2.6 10*3/uL (ref 1.7–7.7)
Neutrophils Relative %: 52 %
Platelets: 276 10*3/uL (ref 150–400)
RBC: 2.68 MIL/uL — ABNORMAL LOW (ref 3.87–5.11)
RDW: 19.9 % — ABNORMAL HIGH (ref 11.5–15.5)
WBC: 5 10*3/uL (ref 4.0–10.5)
nRBC: 0 % (ref 0.0–0.2)

## 2024-05-15 LAB — COMPREHENSIVE METABOLIC PANEL WITH GFR
ALT: 30 U/L (ref 0–44)
AST: 69 U/L — ABNORMAL HIGH (ref 15–41)
Albumin: 2.3 g/dL — ABNORMAL LOW (ref 3.5–5.0)
Alkaline Phosphatase: 677 U/L — ABNORMAL HIGH (ref 38–126)
Anion gap: 11 (ref 5–15)
BUN: 20 mg/dL (ref 8–23)
CO2: 27 mmol/L (ref 22–32)
Calcium: 9.6 mg/dL (ref 8.9–10.3)
Chloride: 99 mmol/L (ref 98–111)
Creatinine, Ser: 0.6 mg/dL (ref 0.44–1.00)
GFR, Estimated: >60 mL/min (ref >60)
Glucose, Bld: 124 mg/dL — ABNORMAL HIGH (ref 70–99)
Potassium: 3.8 mmol/L (ref 3.5–5.1)
Sodium: 137 mmol/L (ref 135–145)
Total Bilirubin: 1.7 mg/dL — ABNORMAL HIGH (ref 0.0–1.2)
Total Protein: 7.1 g/dL (ref 6.5–8.1)

## 2024-05-15 LAB — LIPASE, BLOOD: Lipase: 73 U/L — ABNORMAL HIGH (ref 11–51)

## 2024-05-15 MED ORDER — DULOXETINE HCL 30 MG PO CPEP
30.0000 mg | ORAL_CAPSULE | Freq: Two times a day (BID) | ORAL | Status: DC
  Administered 2024-05-15 – 2024-05-21 (×12): 30 mg via ORAL
  Filled 2024-05-15 (×12): qty 1

## 2024-05-15 MED ORDER — AMLODIPINE BESYLATE 5 MG PO TABS
5.0000 mg | ORAL_TABLET | Freq: Every day | ORAL | Status: DC
  Administered 2024-05-17 – 2024-05-20 (×4): 5 mg via ORAL
  Filled 2024-05-15 (×5): qty 1

## 2024-05-15 MED ORDER — IOHEXOL 300 MG/ML  SOLN
100.0000 mL | Freq: Once | INTRAMUSCULAR | Status: AC | PRN
  Administered 2024-05-15: 100 mL via INTRAVENOUS

## 2024-05-15 MED ORDER — ACETAMINOPHEN 650 MG RE SUPP: 650.0000 mg | Freq: Four times a day (QID) | RECTAL | Status: DC | PRN

## 2024-05-15 MED ORDER — POLYVINYL ALCOHOL 1.4 % OP SOLN: 1.0000 [drp] | Freq: Three times a day (TID) | OPHTHALMIC | Status: DC | PRN

## 2024-05-15 MED ORDER — METHOCARBAMOL 500 MG PO TABS
500.0000 mg | ORAL_TABLET | Freq: Four times a day (QID) | ORAL | Status: DC | PRN
  Administered 2024-05-16 – 2024-05-20 (×9): 500 mg via ORAL
  Filled 2024-05-15 (×10): qty 1

## 2024-05-15 MED ORDER — ACETAMINOPHEN 325 MG PO TABS
650.0000 mg | ORAL_TABLET | Freq: Four times a day (QID) | ORAL | Status: DC | PRN
  Administered 2024-05-16 – 2024-05-18 (×4): 650 mg via ORAL
  Filled 2024-05-15 (×4): qty 2

## 2024-05-15 MED ORDER — B COMPLEX VITAMINS PO CAPS: 1.0000 | ORAL_CAPSULE | Freq: Every day | ORAL | Status: DC

## 2024-05-15 MED ORDER — PANTOPRAZOLE SODIUM 40 MG IV SOLR
40.0000 mg | Freq: Two times a day (BID) | INTRAVENOUS | Status: DC
  Administered 2024-05-16 – 2024-05-21 (×11): 40 mg via INTRAVENOUS
  Filled 2024-05-15 (×11): qty 10

## 2024-05-15 MED ORDER — CARBOXYMETHYLCELLULOSE SODIUM 0.5 % OP SOLN: 1.0000 [drp] | Freq: Three times a day (TID) | OPHTHALMIC | Status: DC | PRN

## 2024-05-15 MED ORDER — PANTOPRAZOLE SODIUM 40 MG IV SOLR
40.0000 mg | Freq: Once | INTRAVENOUS | Status: AC
  Administered 2024-05-15: 40 mg via INTRAVENOUS
  Filled 2024-05-15: qty 10

## 2024-05-15 MED ORDER — ENOXAPARIN SODIUM 40 MG/0.4ML IJ SOSY
40.0000 mg | PREFILLED_SYRINGE | INTRAMUSCULAR | Status: DC
  Administered 2024-05-15 – 2024-05-17 (×3): 40 mg via SUBCUTANEOUS
  Filled 2024-05-15 (×3): qty 0.4

## 2024-05-15 MED ORDER — METOPROLOL SUCCINATE ER 50 MG PO TB24
75.0000 mg | ORAL_TABLET | Freq: Every day | ORAL | Status: DC
  Administered 2024-05-17 – 2024-05-21 (×5): 75 mg via ORAL
  Filled 2024-05-15 (×5): qty 1

## 2024-05-15 MED ORDER — LORAZEPAM 2 MG/ML IJ SOLN
0.2500 mg | Freq: Once | INTRAMUSCULAR | Status: AC
  Administered 2024-05-15: 0.25 mg via INTRAVENOUS
  Filled 2024-05-15: qty 1

## 2024-05-15 NOTE — H&P (Addendum)
 History and Physical    Stacey [name]  ZOX:[id number]  DOB: 1946/05/08  DOA: 05/15/2024 PCP: [name], MD   Patient coming from: Home  Chief Complaint: Abdominal pain  HPI: Stacey  B [name] is a 78 y.o. female with medical history of biliary stricture status post stenting, ascending cholangitis, breast cancer metastasized to bone (follows at [facility]), pathological L1 fracture, intrathecal pump (morphine /bupivacaine ), pulmonary embolism, atrial fibrillation, coronary artery disease with prior stenting, chronic kidney disease stage IIIb, left renal mass which is possibly cancer, COPD, anxiety and depression. She states that she has had 2 to 3 days of abdominal pain and points to her epigastrium to describe where the pain is.  The pain does not radiate to her sides or her back or up into her chest.  It is about 7 out of 10 but when severe it is about 8 out of 10.  Solid food intake has decreased over the past 2 to 3 days but she has continued to drink fluids and can drink water  and boost without issues.  She gets bloated when she attempts to eat solid food and therefore has not been eating much.  She had vomiting once on the 13th and once again on the 14th without hematemesis or coffee grounds.  She has been taking the little pink pill when she is nauseated.  She mostly stays constipated and uses Dulcolax.  Her last bowel movement was 2 days ago and was normal.  ED Course: Treated with Ativan  0.25 mg, Protonix  40 mg IV by EDP  Review of Systems:  All other systems reviewed and apart from HPI, are negative.  Past Medical History:  Diagnosis Date   CAD in native artery    a. DES to ramus 2005 with late stent thrombosis 2006 tx with PTCA. 12/18 PCI/DES x1 to mRCA, EF 50-55%   CHF (congestive heart failure) (HCC)    Chronic pain    COPD (chronic obstructive pulmonary disease) (HCC)    Hyperlipidemia    Hypertension    Left bundle branch block    Lymphedema    Metastatic breast cancer     a. to bone.   MI (myocardial infarction) (HCC)    Mild aortic stenosis 10/2017   Morbid obesity (HCC)    PAF (paroxysmal atrial fibrillation) (HCC)    PSVT (paroxysmal supraventricular tachycardia) (HCC)    a. per [facility] notes, seen on event monitor in 2014.   Pulmonary nodules     Past Surgical History:  Procedure Laterality Date   BILIARY STENT PLACEMENT  03/09/2024   Procedure: INSERTION, STENT, BILE DUCT;  Surgeon: Marnee Sink, MD;  Location: [facility] ENDOSCOPY;  Service: Endoscopy;;   CHOLECYSTECTOMY N/A 03/29/2021   Procedure: LAPAROSCOPIC CHOLECYSTECTOMY;  Surgeon: Awilda Bogus, MD;  Location: [facility];  Service: General;  Laterality: N/A;   CORONARY STENT INTERVENTION N/A 11/26/2017   Procedure: CORONARY STENT INTERVENTION;  Surgeon: Swaziland, Peter M, MD;  Location: [facility] INVASIVE CV LAB;  Service: Cardiovascular;  Laterality: N/A;   CORONARY STENT PLACEMENT     ERCP N/A 03/28/2021   Procedure: ENDOSCOPIC RETROGRADE CHOLANGIOPANCREATOGRAPHY (ERCP);  Surgeon: Ruby Corporal, MD;  Location: [facility];  Service: Endoscopy;  Laterality: N/A;   ERCP N/A 03/09/2024   Procedure: ERCP, WITH INTERVENTION IF INDICATED;  Surgeon: Marnee Sink, MD;  Location: [facility] ENDOSCOPY;  Service: Endoscopy;  Laterality: N/A;   ERCP N/A 04/15/2024   Procedure: ERCP, WITH INTERVENTION IF INDICATED;  Surgeon: Marnee Sink, MD;  Location: [facility] ENDOSCOPY;  Service: Endoscopy;  Laterality: N/A;   ESOPHAGOGASTRODUODENOSCOPY  03/09/2024   Procedure: EGD (ESOPHAGOGASTRODUODENOSCOPY);  Surgeon: Marnee Sink, MD;  Location: [facility] ENDOSCOPY;  Service: Endoscopy;;   intrathecal pain pump     LEFT HEART CATH AND CORONARY ANGIOGRAPHY N/A 11/26/2017   Procedure: LEFT HEART CATH AND CORONARY ANGIOGRAPHY;  Surgeon: Swaziland, Peter M, MD;  Location: [facility] INVASIVE CV LAB;  Service: Cardiovascular;  Laterality: N/A;   REMOVAL OF STONES  03/28/2021   Procedure: REMOVAL OF STONES;  Surgeon: Ruby Corporal, MD;  Location: [facility];   Service: Endoscopy;;   SPHINCTEROTOMY  03/28/2021   Procedure: SPHINCTEROTOMY;  Surgeon: Ruby Corporal, MD;  Location: [facility];  Service: Endoscopy;;   UMBILICAL HERNIA REPAIR N/A 04/03/2021   Procedure: ADULT PRIMARY UMBILICAL HERNIA REPAIR;  Surgeon: Awilda Bogus, MD;  Location: [facility];  Service: General;  Laterality: N/A;   VASCULAR SURGERY      Social History:    reports that she quit smoking about 43 years ago. Her smoking use included cigarettes. She started smoking about 61 years ago. She has a 36 pack-year smoking history. She has never used smokeless tobacco. She reports that she does not drink alcohol  and does not use drugs.  Allergies  Allergen Reactions   Albuterol  Other (See Comments)    Heart racing    Brilinta  [Ticagrelor ] Diarrhea and Nausea Only    Nausea and severe diarrhea- general weakness. Patient says does not want to take again   Budesonide  Palpitations    Family History  Problem Relation Age of Onset   CAD Father 26   Heart attack Father    COPD Sister    CAD Paternal Grandmother    Sudden Cardiac Death Neg Hx    Colon polyps Neg Hx    Colon cancer Neg Hx      Prior to Admission medications   Medication Sig Start Date End Date Taking? Authorizing Provider  amLODipine  (NORVASC ) 5 MG tablet TAKE 1 TABLET EVERY DAY 04/06/24  Yes Branch, Joyceann No, MD  amoxicillin -clavulanate (AUGMENTIN ) 875-125 MG tablet Take 1 tablet by mouth 2 (two) times daily. 04/24/24  Yes [provider]  apixaban  (ELIQUIS ) 2.5 MG TABS tablet Take 2.5 mg by mouth daily.   Yes [provider]  b complex vitamins capsule Take 1 capsule by mouth daily.   Yes [provider]  carboxymethylcellulose (REFRESH PLUS) 0.5 % SOLN Place 1 drop into both eyes 3 (three) times daily as needed (Dry Eyes).   Yes [provider]  clopidogrel  (PLAVIX ) 75 MG tablet Take 75 mg by mouth daily.   Yes [provider]  DULoxetine  (CYMBALTA ) 30 MG capsule  Take 30 mg by mouth 2 (two) times daily.   Yes [provider]  ferrous sulfate  325 (65 FE) MG EC tablet Take 325 mg by mouth daily with breakfast.   Yes [provider]  HYDROmorphone  (DILAUDID ) 1 MG/ML injection Inject 0.5 mLs (0.5 mg total) into the vein every 2 (two) hours as needed for severe pain (pain score 7-10) or moderate pain (pain score 4-6). 03/09/24  Yes Alfonse Angle, Benuel Brazier, MD  HYDROmorphone  (DILAUDID ) 2 MG tablet Take 4 mg by mouth every 4 (four) hours as needed for severe pain (pain score 7-10).   Yes [provider]  losartan  (COZAAR ) 100 MG tablet TAKE 1 TABLET EVERY DAY 04/06/24  Yes Branch, Joyceann No, MD  methocarbamol  (ROBAXIN ) 500 MG tablet Take 500 mg by mouth every 6 (six) hours as  needed for muscle spasms.   Yes [provider]  metoprolol  succinate (TOPROL -XL) 25 MG 24 hr tablet Take 3 tablets (75 mg total) by mouth daily. 03/19/24 05/15/24 Yes Alphonsus Jeans, MD  nitroGLYCERIN  (NITROSTAT ) 0.4 MG SL tablet Place 0.4 mg under the tongue every 5 (five) minutes as needed for chest pain.   Yes [provider]  ondansetron  (ZOFRAN -ODT) 4 MG disintegrating tablet Take 4 mg by mouth every 8 (eight) hours as needed for vomiting or nausea. 05/13/24 05/20/24 Yes [provider]    Physical Exam: Wt Readings from Last 3 Encounters:  05/15/24 79.4 kg  05/01/24 81.3 kg  04/29/24 81.3 kg   Vitals:   05/15/24 1545 05/15/24 1600 05/15/24 1730 05/15/24 1800  BP: (!) 123/57 (!) 119/52 (!) 118/46 (!) 107/55  Pulse: 92 81 83 90  Resp:      Temp:      TempSrc:      SpO2: 93% 94% 91% (!) 89%  Weight:      Height:          Constitutional:  Calm & comfortable Eyes: PERRLA, lids and conjunctivae normal ENT:  Mucous membranes are moist.  Pharynx clear of exudate   Normal dentition.  Respiratory:  Clear to auscultation bilaterally  Normal respiratory effort.  Cardiovascular:  S1 & S2 heard, regular rate and rhythm No  Murmurs Abdomen:  Non distended No tenderness, No masses Bowel sounds normal Extremities:  No clubbing / cyanosis No pedal edema  Skin:  No rashes, lesions or ulcers Neurologic:  AAO x 3 CN 2-12 grossly intact Sensation intact Strength 5/5 in all 4 extremities Psychiatric:  Normal Mood and affect    Labs on Admission: I have personally reviewed labs and imaging studies     Assessment/Plan Principal Problem:   Abdominal pain -Recent history of biliary stricture and ascending cholangitis status post stent placement, subsequent stent exchange, oozing duodenal ulcer with duodenal stricture -Specifically has epigastric pain however she is tender in both right upper and left upper quadrant - LFTs and alkaline phosphatase is 677 (increased from 599 when last checked on June 3), lipase of 73 (increased from 24 when checked on June 1), AST of 69 (improved from 86) and an ALT of 30 (last ALT was 27) -Upon chart review I have discovered that she was admitted from 03/09/2024 through/18/25 to [facility] for obstructive jaundice and was transferred to [facility] where she received a CBD stent.  She was also found to have oozing duodenal ulcers and an acquired duodenal stricture. -She was again admitted from 04/14/24 through 5 25/25 to [facility] for obstructive jaundice, biliary stricture and ascending cholangitis and a stent exchange was performed.  -Today, CT of the abdomen pelvis with contrast: No acute intra-abdominal process, pneumobilia with intra and extrahepatic biliary ductal dilatation and patent common bile duct stent, complex left renal renal mass measuring 3.1 cm, stable L1 compression fracture with severe spinal canal stenosis, coronary artery disease with calcifications, moderate amount of stool in the colon - As she has been tolerating full liquids without issues, will resume full liquid diet - Start Protonix  IV twice daily for her PUD - Can give IV Zofran  as needed for nausea -  Continue to hold Plavix  and Eliquis  - Dr. Mordechai April, with GI consulted by ED physician, Dr. 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 - ER/PR + - [facility] and with Dr Cheree Cords - on [place]   Liver masses - Biopsy was planned for 5/30- based upon radiology note in Epic, it was canceled since she had received Lovenox  injection the night before  Left upper lobe renal mass on imaging - possible  renal cell carcinoma - patient is aware of this     Chronic pain - Has an intrathecal pump with morphine  and bupivacaine     DVT prophylaxis: Lovenox  Code Status: DO NOT RESUSCITATE Consults called: GI Admission status: Observation Level of care: Med-Surg  Sedalia Dacosta MD [facility]    05/15/2024, 6:32 PM

## 2024-05-15 NOTE — ED Provider Notes (Signed)
 [place] EMERGENCY DEPARTMENT AT [facility] Provider Note   CSN: [id number] Arrival date & time: 05/15/24  1517     Patient presents with: Abdominal Pain   Stacey [name] is a 78 y.o. female.  {Add pertinent medical, surgical, social history, OB history to JJO:84166} Patient has a history of a biliary stent in her common bile duct and peptic ulcer disease.  He also has a history of breast cancer.  Patient presents with 3 days of epigastric pain and nausea.   Abdominal Pain      Prior to Admission medications   Medication Sig Start Date End Date Taking? Authorizing Provider  amLODipine  (NORVASC ) 5 MG tablet TAKE 1 TABLET EVERY DAY 04/06/24  Yes Branch, Joyceann No, MD  amoxicillin -clavulanate (AUGMENTIN ) 875-125 MG tablet Take 1 tablet by mouth 2 (two) times daily. 04/24/24  Yes [provider]  apixaban  (ELIQUIS ) 2.5 MG TABS tablet Take 2.5 mg by mouth daily.   Yes [provider]  b complex vitamins capsule Take 1 capsule by mouth daily.   Yes [provider]  carboxymethylcellulose (REFRESH PLUS) 0.5 % SOLN Place 1 drop into both eyes 3 (three) times daily as needed (Dry Eyes).   Yes [provider]  clopidogrel  (PLAVIX ) 75 MG tablet Take 75 mg by mouth daily.   Yes [provider]  DULoxetine  (CYMBALTA ) 30 MG capsule Take 30 mg by mouth 2 (two) times daily.   Yes [provider]  ferrous sulfate  325 (65 FE) MG EC tablet Take 325 mg by mouth daily with breakfast.   Yes [provider]  HYDROmorphone  (DILAUDID ) 1 MG/ML injection Inject 0.5 mLs (0.5 mg total) into the vein every 2 (two) hours as needed for severe pain (pain score 7-10) or moderate pain (pain score 4-6). 03/09/24  Yes Alfonse Angle, Benuel Brazier, MD  HYDROmorphone  (DILAUDID ) 2 MG tablet Take 4 mg by mouth every 4 (four) hours as needed for severe pain (pain score 7-10).   Yes [provider]  losartan  (COZAAR ) 100 MG tablet TAKE 1 TABLET EVERY DAY  04/06/24  Yes Branch, Joyceann No, MD  methocarbamol  (ROBAXIN ) 500 MG tablet Take 500 mg by mouth every 6 (six) hours as needed for muscle spasms.   Yes [provider]  metoprolol  succinate (TOPROL -XL) 25 MG 24 hr tablet Take 3 tablets (75 mg total) by mouth daily. 03/19/24 05/15/24 Yes Alphonsus Jeans, MD  nitroGLYCERIN  (NITROSTAT ) 0.4 MG SL tablet Place 0.4 mg under the tongue every 5 (five) minutes as needed for chest pain.   Yes [provider]  ondansetron  (ZOFRAN -ODT) 4 MG disintegrating tablet Take 4 mg by mouth every 8 (eight) hours as needed for vomiting or nausea. 05/13/24 05/20/24 Yes [provider]    Allergies: Albuterol , Brilinta  [ticagrelor ], and Budesonide     Review of Systems  Gastrointestinal:  Positive for abdominal pain.    Updated Vital Signs BP (!) 107/55   Pulse 90   Temp 98 F (36.7 C) (Temporal)   Resp 18   Ht 5' (1.524 m)   Wt 79.4 kg   SpO2 (!) 89%   BMI 34.18 kg/m   Physical Exam  (all labs ordered are listed, but only abnormal results are displayed) Labs Reviewed  CBC WITH DIFFERENTIAL/PLATELET - Abnormal; Notable for the following components:      Result Value   RBC 2.68 (*)    Hemoglobin 9.4 (*)    HCT 29.4 (*)    MCV 109.7 (*)  MCH 35.1 (*)    RDW 19.9 (*)    All other components within normal limits  COMPREHENSIVE METABOLIC PANEL WITH GFR - Abnormal; Notable for the following components:   Glucose, Bld 124 (*)    Albumin  2.3 (*)    AST 69 (*)    Alkaline Phosphatase 677 (*)    Total Bilirubin 1.7 (*)    All other components within normal limits  LIPASE, BLOOD - Abnormal; Notable for the following components:   Lipase 73 (*)    All other components within normal limits    EKG: None  Radiology: CT ABDOMEN PELVIS W CONTRAST Result Date: 05/15/2024 CLINICAL DATA:  Abdominal pain, acute, nonlocalized.  Fever. EXAM: CT ABDOMEN AND PELVIS WITH CONTRAST TECHNIQUE: Multidetector CT imaging of the abdomen and  pelvis was performed using the standard protocol following bolus administration of intravenous contrast. RADIATION DOSE REDUCTION: This exam was performed according to the departmental dose-optimization program which includes automated exposure control, adjustment of the mA and/or kV according to patient size and/or use of iterative reconstruction technique. CONTRAST:  OMNIPAQUE  IOHEXOL  300 MG/ML  SOLN COMPARISON:  05/01/2024. FINDINGS: Lower chest: Multivessel coronary artery calcifications are noted. Atelectasis is noted at the lung bases. Hepatobiliary: No focal abnormality in the liver. There is pneumobilia with a common bile duct stent in place. The stent appears patent. The gallbladder is surgically absent. Pancreas: The pancreatic duct is prominent at 4 mm. No surrounding inflammatory changes are seen. Spleen: Normal in size without focal abnormality. Adrenals/Urinary Tract: The adrenal glands are within normal limits. The kidneys enhance symmetrically. There is a complex enhancing mass in the mid left kidney measuring up to 3.1 cm. A cyst is noted in the lower pole the left kidney. No renal calculus or hydronephrosis bilaterally. The bladder is unremarkable. Stomach/Bowel: The stomach is within normal limits. No bowel obstruction, free air, or pneumatosis. A moderate amount of retained stool is present in the colon. A few scattered diverticula are present along the colon without evidence of diverticulitis. Appendix is not seen. Vascular/Lymphatic: Aortic atherosclerosis. There is redemonstration of a possible calcified dissection flap versus penetrating ulcer in the distal abdominal aorta. No enlarged abdominal or pelvic lymph nodes. Reproductive: Status post hysterectomy. No adnexal masses. Other: No abdominopelvic ascites. Implanted pump device is seen with in the anterior right abdomen Musculoskeletal: Degenerative changes are present in the thoracolumbar spine. There is severe spinal canal stenosis  with associated compression deformity at the level L1 with retropulsed element, similar in appearance to the prior exam. Sclerosis of the superior endplate at L2 is unchanged. No new acute osseous abnormality is seen. IMPRESSION: 1. No acute intra-abdominal process. 2. Mild in overall large mint of complex enhancing mass in the left kidney, compatible with known renal cell carcinoma. 3. Pneumobilia with intrahepatic and extrahepatic biliary ductal dilatation and patent common bile duct stent in place. 4. Moderate amount of retained stool in the colon. 5. Stable compression deformity at L1 with retropulsed elements resulting in severe spinal canal stenosis. 6. Coronary artery calcifications and aortic atherosclerosis. 7. Remaining chronic findings as described above. Electronically Signed   By: Wyvonnia Heimlich M.D.   On: 05/15/2024 16:49   DG Chest Port 1 View Result Date: 05/15/2024 CLINICAL DATA:  Shortness of breath.  Abdominal pain and vomiting. EXAM: PORTABLE CHEST 1 VIEW COMPARISON:  12/31/2023. FINDINGS: The heart size and mediastinal contours are within normal limits. There is atherosclerotic calcification of the aorta. No consolidation, effusion, or pneumothorax is seen.  No acute osseous abnormality. Surgical clips are identified in the right upper quadrant. IMPRESSION: No active disease. Electronically Signed   By: Wyvonnia Heimlich M.D.   On: 05/15/2024 15:49    {Document cardiac monitor, telemetry assessment procedure when appropriate:32947} Procedures   Medications Ordered in the ED  pantoprazole  (PROTONIX ) injection 40 mg (40 mg Intravenous Given 05/15/24 1540)  LORazepam  (ATIVAN ) injection 0.25 mg (0.25 mg Intravenous Given 05/15/24 1540)  iohexol  (OMNIPAQUE ) 300 MG/ML solution 100 mL (100 mLs Intravenous Contrast Given 05/15/24 1607)   Patient with continuing epigastric pain.  I spoke with Dr. Mordechai April, GI and he reviewed her labs and her history.  He felt like she could be admitted to the medicine  service at [facility] and started on Protonix  IV twice a day.  GI will consult tomorrow   {Click here for ABCD2, HEART and other calculators REFRESH Note before signing:1}                              Medical Decision Making Amount and/or Complexity of Data Reviewed Labs: ordered. Radiology: ordered. ECG/medicine tests: ordered.  Risk Prescription drug management. Decision regarding hospitalization.   Epigastric pain most likely related to peptic ulcer problem  {Document critical care time when appropriate  Document review of labs and clinical decision tools ie CHADS2VASC2, etc  Document your independent review of radiology images and any outside records  Document your discussion with family members, caretakers and with consultants  Document social determinants of health affecting pt's care  Document your decision making why or why not admission, treatments were needed:32947:::1}   Final diagnoses:  Epigastric pain    ED Discharge Orders     None

## 2024-05-15 NOTE — ED Triage Notes (Signed)
 Pt arrived via [facility] from home with complaints of abdominal pain 3-4 days and pt states she vomited 2 days ago. Denies fever. Pt has old wound on left upper leg.

## 2024-05-16 ENCOUNTER — Encounter (HOSPITAL_COMMUNITY): Payer: Self-pay | Admitting: Internal Medicine

## 2024-05-16 ENCOUNTER — Observation Stay (HOSPITAL_COMMUNITY)

## 2024-05-16 DIAGNOSIS — D649 Anemia, unspecified: Secondary | ICD-10-CM | POA: Diagnosis not present

## 2024-05-16 DIAGNOSIS — K838 Other specified diseases of biliary tract: Secondary | ICD-10-CM | POA: Diagnosis not present

## 2024-05-16 DIAGNOSIS — R109 Unspecified abdominal pain: Secondary | ICD-10-CM

## 2024-05-16 DIAGNOSIS — K5909 Other constipation: Secondary | ICD-10-CM | POA: Diagnosis not present

## 2024-05-16 DIAGNOSIS — M48061 Spinal stenosis, lumbar region without neurogenic claudication: Secondary | ICD-10-CM

## 2024-05-16 DIAGNOSIS — R1013 Epigastric pain: Secondary | ICD-10-CM | POA: Diagnosis not present

## 2024-05-16 DIAGNOSIS — Z8711 Personal history of peptic ulcer disease: Secondary | ICD-10-CM

## 2024-05-16 LAB — COMPREHENSIVE METABOLIC PANEL WITH GFR
ALT: 27 U/L (ref 0–44)
AST: 62 U/L — ABNORMAL HIGH (ref 15–41)
Albumin: 2.1 g/dL — ABNORMAL LOW (ref 3.5–5.0)
Alkaline Phosphatase: 661 U/L — ABNORMAL HIGH (ref 38–126)
Anion gap: 6 (ref 5–15)
BUN: 18 mg/dL (ref 8–23)
CO2: 28 mmol/L (ref 22–32)
Calcium: 9.3 mg/dL (ref 8.9–10.3)
Chloride: 103 mmol/L (ref 98–111)
Creatinine, Ser: 0.47 mg/dL (ref 0.44–1.00)
GFR, Estimated: >60 mL/min (ref >60)
Glucose, Bld: 101 mg/dL — ABNORMAL HIGH (ref 70–99)
Potassium: 4.5 mmol/L (ref 3.5–5.1)
Sodium: 137 mmol/L (ref 135–145)
Total Bilirubin: 1.5 mg/dL — ABNORMAL HIGH (ref 0.0–1.2)
Total Protein: 6.7 g/dL (ref 6.5–8.1)

## 2024-05-16 LAB — CBC
HCT: 29.6 % — ABNORMAL LOW (ref 36.0–46.0)
Hemoglobin: 9.3 g/dL — ABNORMAL LOW (ref 12.0–15.0)
MCH: 34.6 pg — ABNORMAL HIGH (ref 26.0–34.0)
MCHC: 31.4 g/dL (ref 30.0–36.0)
MCV: 110 fL — ABNORMAL HIGH (ref 80.0–100.0)
Platelets: 257 10*3/uL (ref 150–400)
RBC: 2.69 MIL/uL — ABNORMAL LOW (ref 3.87–5.11)
RDW: 19.6 % — ABNORMAL HIGH (ref 11.5–15.5)
WBC: 5.6 10*3/uL (ref 4.0–10.5)
nRBC: 0 % (ref 0.0–0.2)

## 2024-05-16 MED ORDER — CAMPHOR-MENTHOL 0.5-0.5 % EX LOTN
TOPICAL_LOTION | CUTANEOUS | Status: DC | PRN
  Filled 2024-05-16: qty 222

## 2024-05-16 MED ORDER — SUCRALFATE 1 GM/10ML PO SUSP
1.0000 g | Freq: Three times a day (TID) | ORAL | Status: DC
  Administered 2024-05-16 – 2024-05-21 (×18): 1 g via ORAL
  Filled 2024-05-16 (×19): qty 10

## 2024-05-16 MED ORDER — POLYETHYLENE GLYCOL 3350 17 G PO PACK
17.0000 g | PACK | Freq: Two times a day (BID) | ORAL | Status: DC
  Administered 2024-05-16 – 2024-05-19 (×5): 17 g via ORAL
  Filled 2024-05-16 (×9): qty 1

## 2024-05-16 NOTE — Progress Notes (Signed)
 Nurse at bedside,patient alert and oriented to person,place,and time,a little confused to situation.Blood pressure 118/45,heart rate 84,Dr Walden notified,holding the amlodipine ,and metoprolol  this morning.Patient c/o generalized pain,rated a 10,discomfort,constant.Tylenol  650 mg's by mouth given for pain,per MAR prn.Plan of care on going.

## 2024-05-16 NOTE — Progress Notes (Signed)
 PROGRESS NOTE  Stacey [name]  WGN:[id number] DOB: August 01, 1946 DOA: 05/15/2024 PCP: [name], MD  Consultants  Brief Narrative: 78 y.o. female with a PMH significant for biliary stricture status post stenting, ascending cholangitis, breast cancer metastasized to bone (follows at [facility]), pathological L1 fracture, intrathecal pump (morphine /bupivacaine ), pulmonary embolism, atrial fibrillation, coronary artery disease with prior stenting, chronic kidney disease stage IIIb, left renal mass which is possibly cancer, COPD, anxiety and depression. She states that she has had 2 to 3 days of abdominal pain and points to her epigastrium to describe where the pain is.  The pain does not radiate to her sides or her back or up into her chest.  It is about 7 out of 10 but when severe it is about 8 out of 10.  Solid food intake has decreased over the past 2 to 3 days but she has continued to drink fluids and can drink water  and boost without issues.  She gets bloated when she attempts to eat solid food and therefore has not been eating much.  She had vomiting once on the 13th and once again on the 14th without hematemesis or coffee grounds.  She has been taking the little pink pill when she is nauseated.  She mostly stays constipated and uses Dulcolax.  Her last bowel movement was 2 days ago and was normal.   Assessment & Plan:  Abdominal pain -Recent history of biliary stricture and ascending cholangitis status post stent placement, subsequent stent exchange, oozing duodenal ulcer with duodenal stricture -Specifically has epigastric pain however she is tender in both right upper and left upper quadrant.  Pain not much better today.   -Working diagnosis thus far is combination of PUD plus chronic stool burden.   - appreciate GI input.  No CBD stent has been occluded.  Agree elevated alk phos likely secondary to metastatic disease. 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 (Active)  03/09/24 7846  Location: Sacrum  Location Orientation:   Staging: Stage 2 -  Partial thickness loss of dermis presenting as a shallow open injury with a red, pink wound bed without slough.  Wound Description (Comments):   DO NOT USE:  Present on Admission: Yes  Dressing Type Foam - Lift dressing to assess site every shift 05/16/24  1610   DVT prophylaxis:  enoxaparin  (LOVENOX ) injection 40 mg Start: 05/15/24 2100  Code Status:   Code Status: Limited: Do not attempt resuscitation (DNR) -DNR-LIMITED -Do Not Intubate/DNI  Level of care: Med-Surg Status is: Observation Dispo: Pending further resolution of pain.   Consults called: Gastroenterology  Subjective: Patient awake and alert and still complaining of some abdominal pain on my exam.  She was able to tolerate liquid diet.  No nausea.  Objective: Vitals:   05/16/24 0000  05/16/24 0400 05/16/24 0947 05/16/24 1247  BP: (!) 127/52 (!) 108/52 (!) 118/45 (!) 117/42  Pulse: 89 87 84 81  Resp: 16 18 18 18   Temp: 98.4 F (36.9 C) 98.3 F (36.8 C) 98.1 F (36.7 C) 98.1 F (36.7 C)  TempSrc: Oral Oral Oral Oral  SpO2: 98% 95% 96% 100%  Weight:      Height:        Intake/Output Summary (Last 24 hours) at 05/16/2024 1617 Last data filed at 05/16/2024 0907 Gross per 24 hour  Intake 240 ml  Output 300 ml  Net -60 ml   Filed Weights   05/15/24 1530  Weight: 79.4 kg   Body mass index is 34.18 kg/m.  Gen: 78 y.o. female in no apparent distress.  Nontoxic Pulm: Non-labored breathing.  Clear to auscultation bilaterally.  CV: Irregular rhythm but normal rate GI: Abdomen soft and nondistended.  Tender to palpation mostly in epigastrium and without guarding or rebound.  Good bowel sounds Ext: Warm, no deformities Skin: No rashes, lesions  Neuro: Alert and oriented. No focal neurological deficits. Psych: Calm  Judgement and insight appear normal. Mood & affect appropriate.    I have personally reviewed the following labs and images: CBC: Recent Labs  Lab 05/15/24 1530 05/16/24 0446  WBC 5.0 5.6  NEUTROABS 2.6  --   HGB 9.4* 9.3*  HCT 29.4* 29.6*  MCV 109.7* 110.0*  PLT 276 257   BMP &GFR Recent Labs  Lab 05/15/24 1530 05/16/24 0446  NA 137 137  K 3.8 4.5  CL 99 103  CO2 27 28  GLUCOSE 124* 101*  BUN 20 18  CREATININE 0.60 0.47  CALCIUM  9.6 9.3   Estimated Creatinine Clearance: 54.9 mL/min (by C-G formula based on SCr of 0.47 mg/dL). Liver & Pancreas: Recent Labs  Lab 05/15/24 1530 05/16/24 0446  AST 69* 62*  ALT 30 27  ALKPHOS 677* 661*  BILITOT 1.7* 1.5*  PROT 7.1 6.7  ALBUMIN  2.3* 2.1*   Recent Labs  Lab 05/15/24 1530  LIPASE 73*   No results for input(s): AMMONIA in the last 168 hours. Diabetic: No results for input(s): HGBA1C in the last 72 hours. No results for input(s): GLUCAP in the last 168 hours. Cardiac  Enzymes: No results for input(s): CKTOTAL, CKMB, CKMBINDEX, TROPONINI in the last 168 hours. No results for input(s): PROBNP in the last 8760 hours. Coagulation Profile: No results for input(s): INR, PROTIME in the last 168 hours. Thyroid  Function Tests: No results for input(s): TSH, T4TOTAL, FREET4, T3FREE, THYROIDAB in the last 72 hours. Lipid Profile: No results for input(s): CHOL, HDL, LDLCALC, TRIG, CHOLHDL, LDLDIRECT in the last 72 hours. Anemia Panel: No results for input(s): VITAMINB12, FOLATE, FERRITIN, TIBC, IRON, RETICCTPCT in the last 72 hours. Urine analysis:    Component Value Date/Time   COLORURINE AMBER (A) 03/08/2024 1943   APPEARANCEUR CLOUDY (A) 03/08/2024 1943   LABSPEC 1.008 03/08/2024 1943   PHURINE 5.0 03/08/2024 1943   GLUCOSEU NEGATIVE 03/08/2024 1943  HGBUR LARGE (A) 03/08/2024 1943   BILIRUBINUR NEGATIVE 03/08/2024 1943   KETONESUR NEGATIVE 03/08/2024 1943   PROTEINUR 100 (A) 03/08/2024 1943   NITRITE NEGATIVE 03/08/2024 1943   LEUKOCYTESUR LARGE (A) 03/08/2024 1943   Sepsis Labs: Invalid input(s): PROCALCITONIN, LACTICIDVEN  Microbiology: No results found for this or any previous visit (from the past 240 hours).  Radiology Studies: US  Venous Img Lower Unilateral Right (DVT) Result Date: 05/16/2024 CLINICAL DATA:  Right leg pain and swelling EXAM: Right LOWER EXTREMITY VENOUS DOPPLER ULTRASOUND TECHNIQUE: Gray-scale sonography with compression, as well as color and duplex ultrasound, were performed to evaluate the deep venous system(s) from the level of the common femoral vein through the popliteal and proximal calf veins. COMPARISON:  None Available. FINDINGS: VENOUS Normal compressibility of the common femoral, superficial femoral, and popliteal veins, as well as the visualized calf veins. Visualized portions of profunda femoral vein and great saphenous vein unremarkable. No filling defects to suggest  DVT on grayscale or color Doppler imaging. Doppler waveforms show normal direction of venous flow, normal respiratory plasticity and response to augmentation. Limited views of the contralateral common femoral vein are unremarkable. OTHER None. Limitations: none IMPRESSION: No evidence of right lower extremity DVT. Electronically Signed   By: Adrianna Horde M.D.   On: 05/16/2024 11:14   CT ABDOMEN PELVIS W CONTRAST Result Date: 05/15/2024 CLINICAL DATA:  Abdominal pain, acute, nonlocalized.  Fever. EXAM: CT ABDOMEN AND PELVIS WITH CONTRAST TECHNIQUE: Multidetector CT imaging of the abdomen and pelvis was performed using the standard protocol following bolus administration of intravenous contrast. RADIATION DOSE REDUCTION: This exam was performed according to the departmental dose-optimization program which includes automated exposure control, adjustment of the mA and/or kV according to patient size and/or use of iterative reconstruction technique. CONTRAST:  OMNIPAQUE  IOHEXOL  300 MG/ML  SOLN COMPARISON:  05/01/2024. FINDINGS: Lower chest: Multivessel coronary artery calcifications are noted. Atelectasis is noted at the lung bases. Hepatobiliary: No focal abnormality in the liver. There is pneumobilia with a common bile duct stent in place. The stent appears patent. The gallbladder is surgically absent. Pancreas: The pancreatic duct is prominent at 4 mm. No surrounding inflammatory changes are seen. Spleen: Normal in size without focal abnormality. Adrenals/Urinary Tract: The adrenal glands are within normal limits. The kidneys enhance symmetrically. There is a complex enhancing mass in the mid left kidney measuring up to 3.1 cm. A cyst is noted in the lower pole the left kidney. No renal calculus or hydronephrosis bilaterally. The bladder is unremarkable. Stomach/Bowel: The stomach is within normal limits. No bowel obstruction, free air, or pneumatosis. A moderate amount of retained stool is present in the  colon. A few scattered diverticula are present along the colon without evidence of diverticulitis. Appendix is not seen. Vascular/Lymphatic: Aortic atherosclerosis. There is redemonstration of a possible calcified dissection flap versus penetrating ulcer in the distal abdominal aorta. No enlarged abdominal or pelvic lymph nodes. Reproductive: Status post hysterectomy. No adnexal masses. Other: No abdominopelvic ascites. Implanted pump device is seen with in the anterior right abdomen Musculoskeletal: Degenerative changes are present in the thoracolumbar spine. There is severe spinal canal stenosis with associated compression deformity at the level L1 with retropulsed element, similar in appearance to the prior exam. Sclerosis of the superior endplate at L2 is unchanged. No new acute osseous abnormality is seen. IMPRESSION: 1. No acute intra-abdominal process. 2. Mild in overall large mint of complex enhancing mass in the left kidney, compatible with known renal cell carcinoma. 3. Pneumobilia  with intrahepatic and extrahepatic biliary ductal dilatation and patent common bile duct stent in place. 4. Moderate amount of retained stool in the colon. 5. Stable compression deformity at L1 with retropulsed elements resulting in severe spinal canal stenosis. 6. Coronary artery calcifications and aortic atherosclerosis. 7. Remaining chronic findings as described above. Electronically Signed   By: Wyvonnia Heimlich M.D.   On: 05/15/2024 16:49    Scheduled Meds:  amLODipine   5 mg Oral Daily   DULoxetine   30 mg Oral BID   enoxaparin  (LOVENOX ) injection  40 mg Subcutaneous Q24H   metoprolol  succinate  75 mg Oral Daily   pantoprazole  (PROTONIX ) IV  40 mg Intravenous BID AC   polyethylene glycol  17 g Oral BID   Continuous Infusions:   LOS: 0 days   35 minutes with more than 50% spent in reviewing records, counseling patient/family and coordinating care.  Trenton Frock, MD [facility] [email or web address] 05/16/2024, 4:17 PM

## 2024-05-16 NOTE — Plan of Care (Signed)
   Problem: Education: Goal: Knowledge of General Education information will improve Description Including pain rating scale, medication(s)/side effects and non-pharmacologic comfort measures Outcome: Progressing   Problem: Education: Goal: Knowledge of General Education information will improve Description Including pain rating scale, medication(s)/side effects and non-pharmacologic comfort measures Outcome: Progressing

## 2024-05-16 NOTE — Care Management Obs Status (Signed)
 MEDICARE OBSERVATION STATUS NOTIFICATION   Patient Details  Name: Stacey [name] MRN: [id number] Date of Birth: 03-25-1946   Medicare Observation Status Notification Given:  Yes    Stacey [name] 05/16/2024, 3:10 PM

## 2024-05-16 NOTE — Consult Note (Addendum)
 Gastroenterology Consult   Referring Provider: Dr. Renie Carver Primary Care Physician:  [name], MD Primary Gastroenterologist:  Dr. Sammi Crick in the past, recently seen inpatient by [facility]  Patient ID: Stacey [name]; [id number]; 16-Aug-1946   Admit date: 05/15/2024  LOS: 0 days   Date of Consultation: 05/16/2024  Reason for Consultation:  epigastric pain, biliary stricture with stent  History of Present Illness   Stacey [name] is a 78 y.o. year old female with history of metastatic breast cancer to bone on Faslodex  monthly and has intrathecal pump,possible liver lesion and will be evaluated with repeat staging in 3 months, cholangitis April and May 2025 with stent placement at [facility] as noted below, duodenal ulcers April 2024 noted at time of first ERCP, PE, afib, CAD, CKD, left renal mass concerning for cancer, COPD, anxiety, depression, on Eliquis  and Plavix , presenting to [facility] yesterday with several day history of epigastric pain that is new to her from baseline pain. GI consulted.  In the ED: Hgb 9.4 (previously 6.6 on 6/1). Tbili 1.7, improved from prior a few weeks ago, ALk Phos 677, previously in upper 500 range, AST 69, previously 86. Lipase mildly elevated at 73. This morning, Alk phos 661, Tbili 1.5, AST 62, ALT 27. Hgb stable 9.3.   CT abd/pelvis with contrast: complex enhancing left renal mass, pneumobilia with intrahepatic and extrahepatic biliary ductal dilatation and patent CBD stent, moderate amount of retained stool in colon, stable compression deformity L1 with severe spinal canal stenosis.  RLE US : No DVT.    TODAY:  She notes onset of pain in epigastric area described as grabbing and different from chronic baseline pain. Present past 2-3 days. Associated nausea, with vomiting X 2. No hematemesis. No melena or hematochezia. Pain worsened postprandially. Feels bloated with eating. Early satiety.decreased oral intake last few days. Unable to chew  meat well due to dentures and right lower jaw cavity. No difficulty with chicken, seafood.   Chronic constipation. Last BM a few days ago. Dulcolax prn. Reports she has a sacral ulcer and uses a barrier cream at home. Nursing staff to assess today. She is bedbound.   Spoke with son, as she lives with him. Last dose of Plavix  and Eliquis  yesterday morning.  Ran out of pantoprazole  about a week or so ago. Wasn't sure if this was something to stay on.    ERCP April 2025 at [facility]: acquired duodenal stenosis, oozing duodenal ulcers, dilation of CBD with stent placement (plastic stent),   ERCP 04/15/24 Dr. Ole Berkeley: stent removal, sweeping of biliary tree with purulence noted, covered metal stent placed into the bile duct, cells for cytology. Path: negative malignancy.   Past Medical History:  Diagnosis Date   CAD in native artery    a. DES to ramus 2005 with late stent thrombosis 2006 tx with PTCA. 12/18 PCI/DES x1 to mRCA, EF 50-55%   CHF (congestive heart failure) (HCC)    Chronic pain    COPD (chronic obstructive pulmonary disease) (HCC)    Hyperlipidemia    Hypertension    Left bundle branch block    Lymphedema    Metastatic breast cancer    a. to bone.   MI (myocardial infarction) (HCC)    Mild aortic stenosis 10/2017   Morbid obesity (HCC)    PAF (paroxysmal atrial fibrillation) (HCC)    PSVT (paroxysmal supraventricular tachycardia) (HCC)    a. per [facility] notes, seen on event monitor in 2014.   Pulmonary nodules  Past Surgical History:  Procedure Laterality Date   BILIARY STENT PLACEMENT  03/09/2024   Procedure: INSERTION, STENT, BILE DUCT;  Surgeon: Marnee Sink, MD;  Location: [facility] ENDOSCOPY;  Service: Endoscopy;;   CHOLECYSTECTOMY N/A 03/29/2021   Procedure: LAPAROSCOPIC CHOLECYSTECTOMY;  Surgeon: Awilda Bogus, MD;  Location: [facility] ORS;  Service: General;  Laterality: N/A;   CORONARY STENT INTERVENTION N/A 11/26/2017   Procedure: CORONARY STENT INTERVENTION;  Surgeon: Swaziland,  Peter M, MD;  Location: [facility] INVASIVE CV LAB;  Service: Cardiovascular;  Laterality: N/A;   CORONARY STENT PLACEMENT     ERCP N/A 03/28/2021   Procedure: ENDOSCOPIC RETROGRADE CHOLANGIOPANCREATOGRAPHY (ERCP);  Surgeon: Ruby Corporal, MD;  Location: [facility] ORS;  Service: Endoscopy;  Laterality: N/A;   ERCP N/A 03/09/2024   Procedure: ERCP, WITH INTERVENTION IF INDICATED;  Surgeon: Marnee Sink, MD;  Location: [facility] ENDOSCOPY;  Service: Endoscopy;  Laterality: N/A;   ERCP N/A 04/15/2024   Procedure: ERCP, WITH INTERVENTION IF INDICATED;  Surgeon: Marnee Sink, MD;  Location: [facility] ENDOSCOPY;  Service: Endoscopy;  Laterality: N/A;   ESOPHAGOGASTRODUODENOSCOPY  03/09/2024   Procedure: EGD (ESOPHAGOGASTRODUODENOSCOPY);  Surgeon: Marnee Sink, MD;  Location: [facility] ENDOSCOPY;  Service: Endoscopy;;   intrathecal pain pump     LEFT HEART CATH AND CORONARY ANGIOGRAPHY N/A 11/26/2017   Procedure: LEFT HEART CATH AND CORONARY ANGIOGRAPHY;  Surgeon: Swaziland, Peter M, MD;  Location: [facility] INVASIVE CV LAB;  Service: Cardiovascular;  Laterality: N/A;   REMOVAL OF STONES  03/28/2021   Procedure: REMOVAL OF STONES;  Surgeon: Ruby Corporal, MD;  Location: [facility]S;  Service: Endoscopy;;   SPHINCTEROTOMY  03/28/2021   Procedure: SPHINCTEROTOMY;  Surgeon: Ruby Corporal, MD;  Location: [facility] ORS;  Service: Endoscopy;;   UMBILICAL HERNIA REPAIR N/A 04/03/2021   Procedure: ADULT PRIMARY UMBILICAL HERNIA REPAIR;  Surgeon: Awilda Bogus, MD;  Location: [facility] ORS;  Service: General;  Laterality: N/A;   VASCULAR SURGERY      Prior to Admission medications   Medication Sig Start Date End Date Taking? Authorizing Provider  amLODipine  (NORVASC ) 5 MG tablet TAKE 1 TABLET EVERY DAY 04/06/24  Yes Branch, Joyceann No, MD  amoxicillin -clavulanate (AUGMENTIN ) 875-125 MG tablet Take 1 tablet by mouth 2 (two) times daily. 04/24/24  Yes [provider]  apixaban  (ELIQUIS ) 2.5 MG TABS tablet Take 2.5 mg by mouth daily.   Yes [provider]  b complex vitamins capsule Take 1 capsule by mouth daily.   Yes [provider]  carboxymethylcellulose (REFRESH PLUS) 0.5 % SOLN Place 1 drop into both eyes 3 (three) times daily as needed (Dry Eyes).   Yes [provider]  clopidogrel  (PLAVIX ) 75 MG tablet Take 75 mg by mouth daily.   Yes [provider]  DULoxetine  (CYMBALTA ) 30 MG capsule Take 30 mg by mouth 2 (two) times daily.   Yes [provider]  ferrous sulfate  325 (65 FE) MG EC tablet Take 325 mg by mouth daily with breakfast.   Yes [provider]  HYDROmorphone  (DILAUDID ) 1 MG/ML injection Inject 0.5 mLs (0.5 mg total) into the vein every 2 (two) hours as needed for severe pain (pain score 7-10) or moderate pain (pain score 4-6). 03/09/24  Yes Alfonse Angle, Benuel Brazier, MD  HYDROmorphone  (DILAUDID ) 2 MG tablet Take 4 mg by mouth every 4 (four) hours as needed for severe pain (pain score 7-10).   Yes [provider]  losartan  (COZAAR ) 100 MG tablet TAKE 1 TABLET EVERY DAY 04/06/24  Yes Branch,  Joyceann No, MD  methocarbamol  (ROBAXIN ) 500 MG tablet Take 500 mg by mouth every 6 (six) hours as needed for muscle spasms.   Yes [provider]  metoprolol  succinate (TOPROL -XL) 25 MG 24 hr tablet Take 3 tablets (75 mg total) by mouth daily. 03/19/24 05/15/24 Yes Alphonsus Jeans, MD  nitroGLYCERIN  (NITROSTAT ) 0.4 MG SL tablet Place 0.4 mg under the tongue every 5 (five) minutes as needed for chest pain.   Yes [provider]  ondansetron  (ZOFRAN -ODT) 4 MG disintegrating tablet Take 4 mg by mouth every 8 (eight) hours as needed for vomiting or nausea. 05/13/24 05/20/24 Yes [provider]    Current Facility-Administered Medications  Medication Dose Route Frequency Provider Last Rate Last Admin   acetaminophen  (TYLENOL ) tablet 650 mg  650 mg Oral Q6H PRN Rizwan, Saima, MD       Or   acetaminophen  (TYLENOL ) suppository 650 mg  650 mg Rectal Q6H PRN Rizwan, Saima, MD        amLODipine  (NORVASC ) tablet 5 mg  5 mg Oral Daily Rizwan, Saima, MD       artificial tears ophthalmic solution 1 drop  1 drop Both Eyes TID PRN Ananias Balls, RPH       DULoxetine  (CYMBALTA ) DR capsule 30 mg  30 mg Oral BID Rizwan, Saima, MD   30 mg at 05/15/24 2128   enoxaparin  (LOVENOX ) injection 40 mg  40 mg Subcutaneous Q24H Rizwan, Saima, MD   40 mg at 05/15/24 2128   methocarbamol  (ROBAXIN ) tablet 500 mg  500 mg Oral Q6H PRN Rizwan, Saima, MD       metoprolol  succinate (TOPROL -XL) 24 hr tablet 75 mg  75 mg Oral Daily Rizwan, Saima, MD       pantoprazole  (PROTONIX ) injection 40 mg  40 mg Intravenous BID AC Rizwan, Saima, MD   40 mg at 05/16/24 0505    Allergies as of 05/15/2024 - Review Complete 05/15/2024  Allergen Reaction Noted   Albuterol  Other (See Comments) 11/30/2018   Brilinta  [ticagrelor ] Diarrhea and Nausea Only 04/19/2018   Budesonide  Palpitations 09/14/2020    Family History  Problem Relation Age of Onset   CAD Father 34   Heart attack Father    COPD Sister    CAD Paternal Grandmother    Sudden Cardiac Death Neg Hx    Colon polyps Neg Hx    Colon cancer Neg Hx     Social History   Socioeconomic History   Marital status: Single    Spouse name: Not on file   Number of children: Not on file   Years of education: Not on file   Highest education level: Not on file  Occupational History   Occupation: Retired  Tobacco Use   Smoking status: Former    Current packs/day: 0.00    Average packs/day: 2.0 packs/day for 18.0 years (36.0 ttl pk-yrs)    Types: Cigarettes    Start date: 12/01/1962    Quit date: 12/01/1980    Years since quitting: 43.4   Smokeless tobacco: Never  Vaping Use   Vaping status: Never Used  Substance and Sexual Activity   Alcohol  use: No   Drug use: Never   Sexual activity: Not Currently  Other Topics Concern   Not on file  Social History Narrative   Not on file   Social Drivers of Health   Financial Resource Strain: High Risk  (01/04/2024)   Received from [facility]   Overall Financial Resource Strain (CARDIA)  Difficulty of Paying Living Expenses: Hard  Food Insecurity: No Food Insecurity (05/16/2024)   Hunger Vital Sign    Worried About Running Out of Food in the Last Year: Never true    Ran Out of Food in the Last Year: Never true  Transportation Needs: No Transportation Needs (05/16/2024)   PRAPARE - Administrator, Civil Service (Medical): No    Lack of Transportation (Non-Medical): No  Physical Activity: Inactive (08/17/2023)   Received from [facility]   Exercise Vital Sign    On average, how many days per week do you engage in moderate to strenuous exercise (like a brisk walk)?: 0 days    On average, how many minutes do you engage in exercise at this level?: 0 min  Stress: No Stress Concern Present (08/17/2023)   Received from [facility] - Occupational Stress Questionnaire    Feeling of Stress : Only a little  Social Connections: Unknown (05/16/2024)   Social Connection and Isolation Panel    Frequency of Communication with Friends and Family: More than three times a week    Frequency of Social Gatherings with Friends and Family: More than three times a week    Attends Religious Services: More than 4 times per year    Active Member of [employer] or Organizations: No    Attends Engineer, structural: More than 4 times per year    Marital Status: Patient declined  Recent Concern: Social Connections - Socially Isolated (03/09/2024)   Social Connection and Isolation Panel    Frequency of Communication with Friends and Family: More than three times a week    Frequency of Social Gatherings with Friends and Family: More than three times a week    Attends Religious Services: Never    Database administrator or Organizations: No    Attends Banker Meetings: Never    Marital Status:  Widowed  Intimate Partner Violence: Not At Risk (05/16/2024)   Humiliation, Afraid, Rape, and Kick questionnaire    Fear of Current or Ex-Partner: No    Emotionally Abused: No    Physically Abused: No    Sexually Abused: No     Review of Systems   Gen: see HPI CV: Denies chest pain, heart palpitations, syncope, edema  Resp: Denies shortness of breath with rest, cough, wheezing, coughing up blood, and pleurisy. GI: see HPI GU : Denies urinary burning, blood in urine, urinary frequency, and urinary incontinence. MS: see HPI  Derm: Denies rash, itching, dry skin, hives. Psych: Denies depression, anxiety, memory loss, hallucinations, and confusion. Heme: see HPI Neuro:  Denies any headaches, dizziness, paresthesias, shaking  Physical Exam   Vital Signs in last 24 hours: Temp:  [98 F (36.7 C)-98.4 F (36.9 C)] 98.3 F (36.8 C) (06/16 0400) Pulse Rate:  [78-98] 87 (06/16 0400) Resp:  [16-18] 18 (06/16 0400) BP: (107-136)/(46-66) 108/52 (06/16 0400) SpO2:  [89 %-98 %] 95 % (06/16 0400) Weight:  [79.4 kg] 79.4 kg (06/15 1530) Last BM Date : 05/14/24  General:   Alert,  chronically ill-appearing Head:  Normocephalic and atraumatic. Eyes:  Sclera clear, no icterus.    Ears:  mild hard of hearing Mouth:  dentures Lungs:  Clear throughout to auscultation.    Heart:  S1 S2 present without obvious murmur Abdomen:  Soft, obese, tender to palpation diffusely but moreso epigastric, no rebound or guarding  Rectal: deferred   Msk:  atrophied lower extremities Extremities:  RLE edema greater than left Neurologic:  Alert and  oriented x4. Psych:  Alert and cooperative. Normal mood and affect.  Intake/Output from previous day: 06/15 0701 - 06/16 0700 In: -  Out: 300 [Urine:300] Intake/Output this shift: Total I/O In: 240 [P.O.:240] Out: -     Labs/Studies   Recent Labs Recent Labs    05/15/24 1530 05/16/24 0446  WBC 5.0 5.6  HGB 9.4* 9.3*  HCT 29.4* 29.6*  PLT 276  257   BMET Recent Labs    05/15/24 1530 05/16/24 0446  NA 137 137  K 3.8 4.5  CL 99 103  CO2 27 28  GLUCOSE 124* 101*  BUN 20 18  CREATININE 0.60 0.47  CALCIUM  9.6 9.3   LFT Recent Labs    05/15/24 1530 05/16/24 0446  PROT 7.1 6.7  ALBUMIN  2.3* 2.1*  AST 69* 62*  ALT 30 27  ALKPHOS 677* 661*  BILITOT 1.7* 1.5*    Radiology/Studies CT ABDOMEN PELVIS W CONTRAST Result Date: 05/15/2024 CLINICAL DATA:  Abdominal pain, acute, nonlocalized.  Fever. EXAM: CT ABDOMEN AND PELVIS WITH CONTRAST TECHNIQUE: Multidetector CT imaging of the abdomen and pelvis was performed using the standard protocol following bolus administration of intravenous contrast. RADIATION DOSE REDUCTION: This exam was performed according to the departmental dose-optimization program which includes automated exposure control, adjustment of the mA and/or kV according to patient size and/or use of iterative reconstruction technique. CONTRAST:  OMNIPAQUE  IOHEXOL  300 MG/ML  SOLN COMPARISON:  05/01/2024. FINDINGS: Lower chest: Multivessel coronary artery calcifications are noted. Atelectasis is noted at the lung bases. Hepatobiliary: No focal abnormality in the liver. There is pneumobilia with a common bile duct stent in place. The stent appears patent. The gallbladder is surgically absent. Pancreas: The pancreatic duct is prominent at 4 mm. No surrounding inflammatory changes are seen. Spleen: Normal in size without focal abnormality. Adrenals/Urinary Tract: The adrenal glands are within normal limits. The kidneys enhance symmetrically. There is a complex enhancing mass in the mid left kidney measuring up to 3.1 cm. A cyst is noted in the lower pole the left kidney. No renal calculus or hydronephrosis bilaterally. The bladder is unremarkable. Stomach/Bowel: The stomach is within normal limits. No bowel obstruction, free air, or pneumatosis. A moderate amount of retained stool is present in the colon. A few scattered  diverticula are present along the colon without evidence of diverticulitis. Appendix is not seen. Vascular/Lymphatic: Aortic atherosclerosis. There is redemonstration of a possible calcified dissection flap versus penetrating ulcer in the distal abdominal aorta. No enlarged abdominal or pelvic lymph nodes. Reproductive: Status post hysterectomy. No adnexal masses. Other: No abdominopelvic ascites. Implanted pump device is seen with in the anterior right abdomen Musculoskeletal: Degenerative changes are present in the thoracolumbar spine. There is severe spinal canal stenosis with associated compression deformity at the level L1 with retropulsed element, similar in appearance to the prior exam. Sclerosis of the superior endplate at L2 is unchanged. No new acute osseous abnormality is seen. IMPRESSION: 1. No acute intra-abdominal process. 2. Mild in overall large mint of complex enhancing mass in the left kidney, compatible with known renal cell carcinoma. 3. Pneumobilia with intrahepatic and extrahepatic biliary ductal dilatation and patent common bile duct stent in place. 4. Moderate amount of retained stool in the colon. 5. Stable compression deformity at L1 with retropulsed elements resulting in severe spinal canal stenosis. 6. Coronary artery calcifications and aortic atherosclerosis. 7. Remaining chronic findings as described above. Electronically  Signed   By: Wyvonnia Heimlich M.D.   On: 05/15/2024 16:49   DG Chest Port 1 View Result Date: 05/15/2024 CLINICAL DATA:  Shortness of breath.  Abdominal pain and vomiting. EXAM: PORTABLE CHEST 1 VIEW COMPARISON:  12/31/2023. FINDINGS: The heart size and mediastinal contours are within normal limits. There is atherosclerotic calcification of the aorta. No consolidation, effusion, or pneumothorax is seen. No acute osseous abnormality. Surgical clips are identified in the right upper quadrant. IMPRESSION: No active disease. Electronically Signed   By: Wyvonnia Heimlich M.D.    On: 05/15/2024 15:49     Assessment   Stacey [name] is a 78 y.o. year old female  with history of metastatic breast cancer to bone on Faslodex  monthly and has intrathecal pump,possible liver lesion and will be evaluated with repeat staging in 3 months, cholangitis April and May 2025 with stent placement at [facility] as noted above, duodenal ulcers April 2024 noted at time of first ERCP, PE, afib, CAD, CKD, left renal mass concerning for cancer, COPD, anxiety, depression, on Eliquis  and Plavix , presenting to [facility] yesterday with several day history of epigastric pain that is new to her from baseline pain. GI consulted.  Epigastric pain: noted worsened postprandially. Known hx of duodenal ulcers and has been off PPI in advertently for over a week per son. Could certainly be related to gastritis/PUD, suspect stool burden also contributing to constellation of symptoms as has chronic constipation and moderate amount of stool on imaging. CBD stent does not appear occluded, no signs of cholangitis, and alk phos elevated likely in setting of metastatic disease. No overt GI bleeding. EGD not recommended and low yield unless significant overt GI bleeding. Agree with BID PPI and will also start on bowel regimen.   Hx of duodenal ulcers: noted at time of ERCP April 2025. Oozing duodenal ulcers at that time. No biopsies. Does not appear to have H.pylori serologies on file. Monitor for any obvious bleeding. No EGD needed unless overt bleeding.   Anemia: hgb 9.4, previously 6.6 several weeks ago and received transfusion in ED. Currently on chemo monthly. No overt GI bleeding.   Last dose of Plavix  and Eliquis  was yesterday morning per son.     Plan / Recommendations    Agree with IV BID PPI. Needs to be discharged on BID PPI therapy as well.  Add carafate suspension QID Start Miralax  BID. May need Linzess in future but will start with this. Patient desires to avoid prescription if possible Clear  liquid diet Supportive measures at this time. Hopeful discharge in future if improved with bowel regimen and resuming PPI.      05/16/2024, 9:13 AM  Delman Ferns, PhD, ANP-BC [facility] Gastroenterology

## 2024-05-16 NOTE — TOC CM/SW Note (Signed)
 Transition of Care [facility]) - Inpatient Brief Assessment   Patient Details  Name: Stacey [name] MRN: [id number] Date of Birth: Aug 06, 1946  Transition of [facility] CM/SW Contact:    Grandville Lax, LCSWA Phone Number: 05/16/2024, 8:59 AM   Clinical Narrative: Transition of Care Department [facility]) has reviewed patient and no TOC needs have been identified at this time. We will continue to monitor patient advancement through interdiciplinary progression rounds. If new patient transition needs arise, please place a TOC consult.  Transition of Care Asessment: Insurance and Status: Insurance coverage has been reviewed Patient has primary care physician: Yes Home environment has been reviewed: From home Prior level of function:: Bedbound, son provides care Prior/Current Home Services: No current home services Social Drivers of Health Review: SDOH reviewed no interventions necessary Readmission risk has been reviewed: Yes Transition of care needs: no transition of care needs at this time

## 2024-05-16 NOTE — Plan of Care (Signed)

## 2024-05-17 DIAGNOSIS — K279 Peptic ulcer, site unspecified, unspecified as acute or chronic, without hemorrhage or perforation: Secondary | ICD-10-CM | POA: Diagnosis not present

## 2024-05-17 DIAGNOSIS — R1013 Epigastric pain: Secondary | ICD-10-CM | POA: Diagnosis not present

## 2024-05-17 DIAGNOSIS — K5900 Constipation, unspecified: Secondary | ICD-10-CM

## 2024-05-17 DIAGNOSIS — K5909 Other constipation: Secondary | ICD-10-CM

## 2024-05-17 LAB — HEPATIC FUNCTION PANEL
ALT: 27 U/L (ref 0–44)
AST: 68 U/L — ABNORMAL HIGH (ref 15–41)
Albumin: 2.2 g/dL — ABNORMAL LOW (ref 3.5–5.0)
Alkaline Phosphatase: 647 U/L — ABNORMAL HIGH (ref 38–126)
Bilirubin, Direct: 0.5 mg/dL — ABNORMAL HIGH (ref 0.0–0.2)
Indirect Bilirubin: 0.9 mg/dL (ref 0.3–0.9)
Total Bilirubin: 1.4 mg/dL — ABNORMAL HIGH (ref 0.0–1.2)
Total Protein: 6.6 g/dL (ref 6.5–8.1)

## 2024-05-17 LAB — CBC
HCT: 31.5 % — ABNORMAL LOW (ref 36.0–46.0)
Hemoglobin: 9.5 g/dL — ABNORMAL LOW (ref 12.0–15.0)
MCH: 33.3 pg (ref 26.0–34.0)
MCHC: 30.2 g/dL (ref 30.0–36.0)
MCV: 110.5 fL — ABNORMAL HIGH (ref 80.0–100.0)
Platelets: 248 10*3/uL (ref 150–400)
RBC: 2.85 MIL/uL — ABNORMAL LOW (ref 3.87–5.11)
RDW: 19.4 % — ABNORMAL HIGH (ref 11.5–15.5)
WBC: 4.6 10*3/uL (ref 4.0–10.5)
nRBC: 0 % (ref 0.0–0.2)

## 2024-05-17 LAB — BASIC METABOLIC PANEL WITH GFR
Anion gap: 7 (ref 5–15)
BUN: 13 mg/dL (ref 8–23)
CO2: 27 mmol/L (ref 22–32)
Calcium: 9 mg/dL (ref 8.9–10.3)
Chloride: 104 mmol/L (ref 98–111)
Creatinine, Ser: 0.56 mg/dL (ref 0.44–1.00)
GFR, Estimated: >60 mL/min (ref >60)
Glucose, Bld: 100 mg/dL — ABNORMAL HIGH (ref 70–99)
Potassium: 4.3 mmol/L (ref 3.5–5.1)
Sodium: 138 mmol/L (ref 135–145)

## 2024-05-17 MED ORDER — ONDANSETRON HCL 4 MG/2ML IJ SOLN
4.0000 mg | Freq: Four times a day (QID) | INTRAMUSCULAR | Status: DC | PRN
  Filled 2024-05-17: qty 2

## 2024-05-17 NOTE — Progress Notes (Signed)
 Subjective: States she isn't feeling well. Continues with 10/10 epigastric abdominal pain that is constant and non-radiating. Denies worsening with meals (though this was reported yesterday). Not sure if this is similar to symptoms when she presented with cholangitis. Was able to eat all of her soup, jello, and punning last night without difficulty. Has had nausea/vomiting since dealing with epigastric pain, but none in the last 2 days until she smelled her coffee while I was in the room and started dry heaving for about 5 minutes but symptoms spontaneously resolved and she never vomited.   States the main thing that is bothering her is she hasn't had a bowel movement in several days. Not sure if she has passed gas. No abdominal distension. Constipation is a chronic issue for her.   Denies NSAIDs.   Objective: Vital signs in last 24 hours: Temp:  [97.6 F (36.4 C)-98.2 F (36.8 C)] 98.2 F (36.8 C) (06/17 0433) Pulse Rate:  [81-98] 98 (06/17 0433) Resp:  [17-18] 17 (06/17 0433) BP: (116-123)/(39-54) 116/54 (06/17 0433) SpO2:  [95 %-100 %] 98 % (06/17 0433) Last BM Date : 05/14/24 General:   Alert and oriented, pleasant, NAD Head:  Normocephalic and atraumatic. Eyes:  No icterus, sclera clear. Conjuctiva pink.  Abdomen:  Bowel sounds present, soft, non-distended. Moderate TTP in epigastric area. No masses appreciated  Extremities:  Atrophied, RLE edema greater than left.  Skin:  Warm and dry, intact without significant lesions.  Psych:  Normal mood and affect.  Intake/Output from previous day: 06/16 0701 - 06/17 0700 In: 240 [P.O.:240] Out: 1150 [Urine:1150] Intake/Output this shift: No intake/output data recorded.  Lab Results: Recent Labs    05/15/24 1530 05/16/24 0446 05/17/24 0426  WBC 5.0 5.6 4.6  HGB 9.4* 9.3* 9.5*  HCT 29.4* 29.6* 31.5*  PLT 276 257 248   BMET Recent Labs    05/15/24 1530 05/16/24 0446 05/17/24 0426  NA 137 137 138  K 3.8 4.5 4.3  CL  99 103 104  CO2 27 28 27   GLUCOSE 124* 101* 100*  BUN 20 18 13   CREATININE 0.60 0.47 0.56  CALCIUM  9.6 9.3 9.0   LFT Recent Labs    05/15/24 1530 05/16/24 0446  PROT 7.1 6.7  ALBUMIN  2.3* 2.1*  AST 69* 62*  ALT 30 27  ALKPHOS 677* 661*  BILITOT 1.7* 1.5*    Studies/Results: US  Venous Img Lower Unilateral Right (DVT) Result Date: 05/16/2024 CLINICAL DATA:  Right leg pain and swelling EXAM: Right LOWER EXTREMITY VENOUS DOPPLER ULTRASOUND TECHNIQUE: Gray-scale sonography with compression, as well as color and duplex ultrasound, were performed to evaluate the deep venous system(s) from the level of the common femoral vein through the popliteal and proximal calf veins. COMPARISON:  None Available. FINDINGS: VENOUS Normal compressibility of the common femoral, superficial femoral, and popliteal veins, as well as the visualized calf veins. Visualized portions of profunda femoral vein and great saphenous vein unremarkable. No filling defects to suggest DVT on grayscale or color Doppler imaging. Doppler waveforms show normal direction of venous flow, normal respiratory plasticity and response to augmentation. Limited views of the contralateral common femoral vein are unremarkable. OTHER None. Limitations: none IMPRESSION: No evidence of right lower extremity DVT. Electronically Signed   By: Adrianna Horde M.D.   On: 05/16/2024 11:14   CT ABDOMEN PELVIS W CONTRAST Result Date: 05/15/2024 CLINICAL DATA:  Abdominal pain, acute, nonlocalized.  Fever. EXAM: CT ABDOMEN AND PELVIS WITH CONTRAST TECHNIQUE: Multidetector CT imaging of the  abdomen and pelvis was performed using the standard protocol following bolus administration of intravenous contrast. RADIATION DOSE REDUCTION: This exam was performed according to the departmental dose-optimization program which includes automated exposure control, adjustment of the mA and/or kV according to patient size and/or use of iterative reconstruction technique.  CONTRAST:  OMNIPAQUE  IOHEXOL  300 MG/ML  SOLN COMPARISON:  05/01/2024. FINDINGS: Lower chest: Multivessel coronary artery calcifications are noted. Atelectasis is noted at the lung bases. Hepatobiliary: No focal abnormality in the liver. There is pneumobilia with a common bile duct stent in place. The stent appears patent. The gallbladder is surgically absent. Pancreas: The pancreatic duct is prominent at 4 mm. No surrounding inflammatory changes are seen. Spleen: Normal in size without focal abnormality. Adrenals/Urinary Tract: The adrenal glands are within normal limits. The kidneys enhance symmetrically. There is a complex enhancing mass in the mid left kidney measuring up to 3.1 cm. A cyst is noted in the lower pole the left kidney. No renal calculus or hydronephrosis bilaterally. The bladder is unremarkable. Stomach/Bowel: The stomach is within normal limits. No bowel obstruction, free air, or pneumatosis. A moderate amount of retained stool is present in the colon. A few scattered diverticula are present along the colon without evidence of diverticulitis. Appendix is not seen. Vascular/Lymphatic: Aortic atherosclerosis. There is redemonstration of a possible calcified dissection flap versus penetrating ulcer in the distal abdominal aorta. No enlarged abdominal or pelvic lymph nodes. Reproductive: Status post hysterectomy. No adnexal masses. Other: No abdominopelvic ascites. Implanted pump device is seen with in the anterior right abdomen Musculoskeletal: Degenerative changes are present in the thoracolumbar spine. There is severe spinal canal stenosis with associated compression deformity at the level L1 with retropulsed element, similar in appearance to the prior exam. Sclerosis of the superior endplate at L2 is unchanged. No new acute osseous abnormality is seen. IMPRESSION: 1. No acute intra-abdominal process. 2. Mild in overall large mint of complex enhancing mass in the left kidney, compatible with  known renal cell carcinoma. 3. Pneumobilia with intrahepatic and extrahepatic biliary ductal dilatation and patent common bile duct stent in place. 4. Moderate amount of retained stool in the colon. 5. Stable compression deformity at L1 with retropulsed elements resulting in severe spinal canal stenosis. 6. Coronary artery calcifications and aortic atherosclerosis. 7. Remaining chronic findings as described above. Electronically Signed   By: Wyvonnia Heimlich M.D.   On: 05/15/2024 16:49   DG Chest Port 1 View Result Date: 05/15/2024 CLINICAL DATA:  Shortness of breath.  Abdominal pain and vomiting. EXAM: PORTABLE CHEST 1 VIEW COMPARISON:  12/31/2023. FINDINGS: The heart size and mediastinal contours are within normal limits. There is atherosclerotic calcification of the aorta. No consolidation, effusion, or pneumothorax is seen. No acute osseous abnormality. Surgical clips are identified in the right upper quadrant. IMPRESSION: No active disease. Electronically Signed   By: Wyvonnia Heimlich M.D.   On: 05/15/2024 15:49    Assessment: 78 y.o. year old female  with history of metastatic breast cancer to bone on Faslodex  monthly and has intrathecal pump,possible liver lesion and will be evaluated with repeat staging in 3 months, left renal mass concerning for cancer, cholangitis April and May 2025 with stent placement at [facility], duodenal ulcers April 2024 noted at the time of first ERCP with associated acquired duodenal stenosis, CAD, CKD, COPD, anxiety, depression, PE, afib, on Eliquis  and Plavix , who presented to the ER on 05/15/24 with several day history of epigastric pain that is new to hre from baseline pain.  GI consulted for further evaluation.   Epigastric pain:  New onset in the last several days. Associated intermittent nausea/vomiting, but no vomiting in 48 hours.  Known hx of duodenal ulcers and has been off PPI in advertently for over a week per son. Could certainly be related to gastritis/PUD and/or  duodenal stenosis. Stool burden/uncontrolled constipation may also be contributing. CBD stent does not appear occluded, currently without signs of cholangitis. T bili is improved. Elevated alk phos may be moreso driven by metastatic disease. Considering reports of 10/10 pain (despite looking very comfortable) will repeat HFP today to ensure not acute changes to suggest changes with biliary stent. She has had no overt GI bleeding. No EGD recommended at this time, suspected to be low yield unless significant overt GI bleeding. May need to consider EGD however if patient fails to improve with supportive measures, resuming PPI, adding carafate, and improving bowel function.   Constipation:  Chronic. Uncontrolled. Continue MiraLAX  BID for now, but may need to start  Linzess if no results from MiraLAX  in the next 24 hours. Considered bowel prep, but patient doesn't think she can tolerate this.   History of duodenal ulcers:  Noted at time of ERCP April 2025. Oozing duodenal ulcers at that time. No biopsies. Does not appear to have H.pylori serologies on file. Will monitor for any obvious bleeding. No EGD needed unless overt bleeding.   Anemia:  Chronic since 2021. Baseline had been 10-11 until March/April when Hgb declined down as low at 6.7 and she was found to have oozing duodenal ulcer at the time of ERCP. Hgb down again to 6.6 son 05/01/24 and received transfusion in ED. This admission, Hgb stable  in the 9 range. No prior colonoscopy. No comment was made on duodenum at the time of last ERCP in May. She has had no overt GI bleeding. No iron deficiency in April 2025. No indication for inpatient endoscopic evaluation at this time. Continue to monitor.    Plan: HFP Add IV Zofran  4 mg q6 hrs prn. Continue MiraLAX  17g BID. Consider starting Linzess if no results from MiraLAX  in the next 24 hours.  Continue IV pantoprazole  40 mg BID Continue carafate 1g QID  Continue full liquid diet for now.  Continue to  monitor Hgb and for overt GI bleeding.  Holding off on EGD for now unless overt GI bleeding or patient fails to improve.     LOS: 0 days    05/17/2024, 8:16 AM   Shana Daring, [facility]

## 2024-05-17 NOTE — Plan of Care (Signed)

## 2024-05-17 NOTE — Plan of Care (Signed)
  Problem: Education: Goal: Knowledge of General Education information will improve Description: Including pain rating scale, medication(s)/side effects and non-pharmacologic comfort measures 05/17/2024 0600 by Shanigua Gibb K, RN Outcome: Progressing 05/17/2024 0600 by Glory Larsen, RN Outcome: Progressing   Problem: Health Behavior/Discharge Planning: Goal: Ability to manage health-related needs will improve Outcome: Progressing   Problem: Clinical Measurements: Goal: Ability to maintain clinical measurements within normal limits will improve Outcome: Progressing Goal: Will remain free from infection Outcome: Progressing Goal: Diagnostic test results will improve Outcome: Progressing Goal: Respiratory complications will improve Outcome: Progressing Goal: Cardiovascular complication will be avoided Outcome: Progressing   Problem: Activity: Goal: Risk for activity intolerance will decrease Outcome: Progressing   Problem: Nutrition: Goal: Adequate nutrition will be maintained Outcome: Progressing   Problem: Coping: Goal: Level of anxiety will decrease Outcome: Progressing   Problem: Elimination: Goal: Will not experience complications related to bowel motility Outcome: Progressing Goal: Will not experience complications related to urinary retention Outcome: Progressing   Problem: Pain Managment: Goal: General experience of comfort will improve and/or be controlled Outcome: Progressing   Problem: Safety: Goal: Ability to remain free from injury will improve Outcome: Progressing   Problem: Skin Integrity: Goal: Risk for impaired skin integrity will decrease Outcome: Progressing

## 2024-05-17 NOTE — Progress Notes (Signed)
 PROGRESS NOTE  Stacey  B Venice [name]  NWG:[id number] DOB: 05-19-1946 DOA: 05/15/2024 PCP: [name], MD  Consultants  Brief Narrative: 78 y.o. female with a PMH significant for biliary stricture status post stenting, ascending cholangitis, breast cancer metastasized to bone (follows at [facility]), pathological L1 fracture, intrathecal pump (morphine /bupivacaine ), pulmonary embolism, atrial fibrillation, coronary artery disease with prior stenting, chronic kidney disease stage IIIb, left renal mass which is possibly cancer, COPD, anxiety and depression.  Brought to the emergency department with worsening epigastric/abdominal pain.  As well as bloating and infrequent vomiting.  Admitted for the same.  Gastroenterology following.  Working diagnosis is PUD/constipation.   Assessment & Plan:  Abdominal pain -As above, treating for combination of PUD plus constipation.   - Aggressive medical management with PPI twice daily.  Carafate also added.   - Patient recently ran out of her PPI which likely led to recurrence of her symptoms.   - Appreciate gastroenterology input.  Constipation: - Still with no bowel movement.  She has been struggling somewhat today with finishing her MiraLAX . - If persist tomorrow we will start Linzess   Right lower extremity edema - h/o PE in 2024 - She has 2+ pitting edema in her right leg and foot and none in her left leg and foot -Dopplers negative here   Atrial fibrillation, chronic Coronary artery disease s/p stenting Hypertension - Based on review of her medical records, Eliquis  and Plavix  were paused on May 24th, although family reports that she is continuing to take both Plavix  and aspirin  - Continue metoprolol  and amlodipine -hold losartan  - restart home meds on discharge with Cardiology follow-up.     Stage IV breast cancer with metastasis to bones - ER/PR + - Follows at [facility] and with Dr Cheree Cords - on Fulvestrant    Liver masses - Biopsy was planned for  5/30- based upon radiology note in Epic, it was canceled since she had received Lovenox  injection the night before   Left upper lobe renal mass on imaging - possible  renal cell carcinoma - patient is aware of this      Chronic pain - Has an intrathecal pump with morphine  and bupivacaine   Pressure Injury 03/09/24 Sacrum Stage 2 -  Partial thickness loss of dermis presenting as a shallow open injury with a red, pink wound bed without slough. (Active)  03/09/24 5784  Location: Sacrum  Location Orientation:   Staging: Stage 2 -  Partial thickness loss of dermis presenting as a shallow open injury with a red, pink wound bed without slough.  Wound Description (Comments):   DO NOT USE:  Present on Admission: Yes  Dressing Type Foam - Lift dressing to assess site every shift 05/17/24 0800   DVT prophylaxis:  enoxaparin  (LOVENOX ) injection 40 mg Start: 05/15/24 2100  Code Status:   Code Status: Limited: Do not attempt resuscitation (DNR) -DNR-LIMITED -Do Not Intubate/DNI  Level of care: Med-Surg Status is: Observation Dispo: Pending further resolution of pain.   Consults called: Gastroenterology  Subjective: Patient awake and alert this AM.  No complaints except that she does not want to finish her MiraLAX .  Still has not had a bowel movement yet.  No nausea.   Objective: Vitals:   05/16/24 1247 05/16/24 2109 05/17/24 0433 05/17/24 1501  BP: (!) 117/42 (!) 123/39 (!) 116/54 114/77  Pulse: 81 97 98 92  Resp: 18 17 17 18   Temp: 98.1 F (36.7 C) 97.6 F (36.4 C) 98.2 F (36.8 C) 98.7 F (37.1 C)  TempSrc:  Oral Oral Oral Oral  SpO2: 100% 95% 98% 99%  Weight:      Height:        Intake/Output Summary (Last 24 hours) at 05/17/2024 1529 Last data filed at 05/17/2024 0437 Gross per 24 hour  Intake --  Output 1150 ml  Net -1150 ml   Filed Weights   05/15/24 1530  Weight: 79.4 kg   Body mass index is 34.18 kg/m.  Gen: 78 y.o. female in no apparent distress.  Nontoxic Pulm:  Non-labored breathing.  Clear to auscultation bilaterally.  CV: Irregular rhythm but normal rate GI: Abdomen soft and nondistended.  Tender to palpation mostly in epigastrium and without guarding or rebound.  Good bowel sounds Ext: Warm, no deformities Skin: No rashes, lesions  Neuro: Alert and oriented. No focal neurological deficits. Psych: Calm  Judgement and insight appear normal. Mood & affect appropriate.    I have personally reviewed the following labs and images: CBC: Recent Labs  Lab 05/15/24 1530 05/16/24 0446 05/17/24 0426  WBC 5.0 5.6 4.6  NEUTROABS 2.6  --   --   HGB 9.4* 9.3* 9.5*  HCT 29.4* 29.6* 31.5*  MCV 109.7* 110.0* 110.5*  PLT 276 257 248   BMP &GFR Recent Labs  Lab 05/15/24 1530 05/16/24 0446 05/17/24 0426  NA 137 137 138  K 3.8 4.5 4.3  CL 99 103 104  CO2 27 28 27   GLUCOSE 124* 101* 100*  BUN 20 18 13   CREATININE 0.60 0.47 0.56  CALCIUM  9.6 9.3 9.0   Estimated Creatinine Clearance: 54.9 mL/min (by C-G formula based on SCr of 0.56 mg/dL). Liver & Pancreas: Recent Labs  Lab 05/15/24 1530 05/16/24 0446 05/17/24 0954  AST 69* 62* 68*  ALT 30 27 27   ALKPHOS 677* 661* 647*  BILITOT 1.7* 1.5* 1.4*  PROT 7.1 6.7 6.6  ALBUMIN  2.3* 2.1* 2.2*   Recent Labs  Lab 05/15/24 1530  LIPASE 73*   No results for input(s): AMMONIA in the last 168 hours. Diabetic: No results for input(s): HGBA1C in the last 72 hours. No results for input(s): GLUCAP in the last 168 hours. Cardiac Enzymes: No results for input(s): CKTOTAL, CKMB, CKMBINDEX, TROPONINI in the last 168 hours. No results for input(s): PROBNP in the last 8760 hours. Coagulation Profile: No results for input(s): INR, PROTIME in the last 168 hours. Thyroid  Function Tests: No results for input(s): TSH, T4TOTAL, FREET4, T3FREE, THYROIDAB in the last 72 hours. Lipid Profile: No results for input(s): CHOL, HDL, LDLCALC, TRIG, CHOLHDL, LDLDIRECT in the  last 72 hours. Anemia Panel: No results for input(s): VITAMINB12, FOLATE, FERRITIN, TIBC, IRON, RETICCTPCT in the last 72 hours. Urine analysis:    Component Value Date/Time   COLORURINE AMBER (A) 03/08/2024 1943   APPEARANCEUR CLOUDY (A) 03/08/2024 1943   LABSPEC 1.008 03/08/2024 1943   PHURINE 5.0 03/08/2024 1943   GLUCOSEU NEGATIVE 03/08/2024 1943   HGBUR LARGE (A) 03/08/2024 1943   BILIRUBINUR NEGATIVE 03/08/2024 1943   KETONESUR NEGATIVE 03/08/2024 1943   PROTEINUR 100 (A) 03/08/2024 1943   NITRITE NEGATIVE 03/08/2024 1943   LEUKOCYTESUR LARGE (A) 03/08/2024 1943   Sepsis Labs: Invalid input(s): PROCALCITONIN, LACTICIDVEN  Microbiology: No results found for this or any previous visit (from the past 240 hours).  Radiology Studies: No results found.   Scheduled Meds:  amLODipine   5 mg Oral Daily   DULoxetine   30 mg Oral BID   enoxaparin  (LOVENOX ) injection  40 mg Subcutaneous Q24H   metoprolol  succinate  75 mg  Oral Daily   pantoprazole  (PROTONIX ) IV  40 mg Intravenous BID AC   polyethylene glycol  17 g Oral BID   sucralfate  1 g Oral TID AC & HS   Continuous Infusions:   LOS: 0 days   35 minutes with more than 50% spent in reviewing records, counseling patient/family and coordinating care.  Trenton Frock, MD [facility] [email or web address] 05/17/2024, 3:29 PM

## 2024-05-18 ENCOUNTER — Inpatient Hospital Stay (HOSPITAL_COMMUNITY)

## 2024-05-18 DIAGNOSIS — I480 Paroxysmal atrial fibrillation: Secondary | ICD-10-CM | POA: Diagnosis present

## 2024-05-18 DIAGNOSIS — I252 Old myocardial infarction: Secondary | ICD-10-CM | POA: Diagnosis not present

## 2024-05-18 DIAGNOSIS — M4805 Spinal stenosis, thoracolumbar region: Secondary | ICD-10-CM | POA: Diagnosis present

## 2024-05-18 DIAGNOSIS — Y92009 Unspecified place in unspecified non-institutional (private) residence as the place of occurrence of the external cause: Secondary | ICD-10-CM | POA: Diagnosis not present

## 2024-05-18 DIAGNOSIS — D7589 Other specified diseases of blood and blood-forming organs: Secondary | ICD-10-CM | POA: Diagnosis present

## 2024-05-18 DIAGNOSIS — R160 Hepatomegaly, not elsewhere classified: Secondary | ICD-10-CM | POA: Diagnosis present

## 2024-05-18 DIAGNOSIS — D539 Nutritional anemia, unspecified: Secondary | ICD-10-CM | POA: Diagnosis present

## 2024-05-18 DIAGNOSIS — R1013 Epigastric pain: Secondary | ICD-10-CM | POA: Diagnosis present

## 2024-05-18 DIAGNOSIS — J449 Chronic obstructive pulmonary disease, unspecified: Secondary | ICD-10-CM | POA: Diagnosis present

## 2024-05-18 DIAGNOSIS — C50919 Malignant neoplasm of unspecified site of unspecified female breast: Secondary | ICD-10-CM | POA: Diagnosis present

## 2024-05-18 DIAGNOSIS — K279 Peptic ulcer, site unspecified, unspecified as acute or chronic, without hemorrhage or perforation: Secondary | ICD-10-CM | POA: Diagnosis not present

## 2024-05-18 DIAGNOSIS — K5909 Other constipation: Secondary | ICD-10-CM | POA: Diagnosis present

## 2024-05-18 DIAGNOSIS — R109 Unspecified abdominal pain: Secondary | ICD-10-CM | POA: Diagnosis not present

## 2024-05-18 DIAGNOSIS — I2510 Atherosclerotic heart disease of native coronary artery without angina pectoris: Secondary | ICD-10-CM | POA: Diagnosis present

## 2024-05-18 DIAGNOSIS — I129 Hypertensive chronic kidney disease with stage 1 through stage 4 chronic kidney disease, or unspecified chronic kidney disease: Secondary | ICD-10-CM | POA: Diagnosis present

## 2024-05-18 DIAGNOSIS — F32A Depression, unspecified: Secondary | ICD-10-CM | POA: Diagnosis present

## 2024-05-18 DIAGNOSIS — N1832 Chronic kidney disease, stage 3b: Secondary | ICD-10-CM | POA: Diagnosis present

## 2024-05-18 DIAGNOSIS — I482 Chronic atrial fibrillation, unspecified: Secondary | ICD-10-CM | POA: Diagnosis present

## 2024-05-18 DIAGNOSIS — C7951 Secondary malignant neoplasm of bone: Secondary | ICD-10-CM | POA: Diagnosis present

## 2024-05-18 DIAGNOSIS — Z66 Do not resuscitate: Secondary | ICD-10-CM | POA: Diagnosis present

## 2024-05-18 DIAGNOSIS — E785 Hyperlipidemia, unspecified: Secondary | ICD-10-CM | POA: Diagnosis present

## 2024-05-18 DIAGNOSIS — L89152 Pressure ulcer of sacral region, stage 2: Secondary | ICD-10-CM | POA: Diagnosis present

## 2024-05-18 DIAGNOSIS — K5900 Constipation, unspecified: Secondary | ICD-10-CM | POA: Diagnosis not present

## 2024-05-18 DIAGNOSIS — F419 Anxiety disorder, unspecified: Secondary | ICD-10-CM | POA: Diagnosis present

## 2024-05-18 DIAGNOSIS — K269 Duodenal ulcer, unspecified as acute or chronic, without hemorrhage or perforation: Secondary | ICD-10-CM | POA: Diagnosis present

## 2024-05-18 DIAGNOSIS — M8458XA Pathological fracture in neoplastic disease, other specified site, initial encounter for fracture: Secondary | ICD-10-CM | POA: Diagnosis present

## 2024-05-18 DIAGNOSIS — E669 Obesity, unspecified: Secondary | ICD-10-CM | POA: Diagnosis present

## 2024-05-18 DIAGNOSIS — E1122 Type 2 diabetes mellitus with diabetic chronic kidney disease: Secondary | ICD-10-CM | POA: Diagnosis present

## 2024-05-18 DIAGNOSIS — C7902 Secondary malignant neoplasm of left kidney and renal pelvis: Secondary | ICD-10-CM | POA: Diagnosis present

## 2024-05-18 LAB — CBC
HCT: 31.7 % — ABNORMAL LOW (ref 36.0–46.0)
Hemoglobin: 9.8 g/dL — ABNORMAL LOW (ref 12.0–15.0)
MCH: 34.1 pg — ABNORMAL HIGH (ref 26.0–34.0)
MCHC: 30.9 g/dL (ref 30.0–36.0)
MCV: 110.5 fL — ABNORMAL HIGH (ref 80.0–100.0)
Platelets: 219 10*3/uL (ref 150–400)
RBC: 2.87 MIL/uL — ABNORMAL LOW (ref 3.87–5.11)
RDW: 18.9 % — ABNORMAL HIGH (ref 11.5–15.5)
WBC: 4.6 10*3/uL (ref 4.0–10.5)
nRBC: 0 % (ref 0.0–0.2)

## 2024-05-18 LAB — URINALYSIS, W/ REFLEX TO CULTURE (INFECTION SUSPECTED)
Bilirubin Urine: NEGATIVE
Glucose, UA: NEGATIVE mg/dL
Hgb urine dipstick: NEGATIVE
Ketones, ur: NEGATIVE mg/dL
Nitrite: NEGATIVE
Protein, ur: NEGATIVE mg/dL
Specific Gravity, Urine: 1.013 (ref 1.005–1.030)
pH: 5 (ref 5.0–8.0)

## 2024-05-18 LAB — BASIC METABOLIC PANEL WITH GFR
Anion gap: 10 (ref 5–15)
BUN: 11 mg/dL (ref 8–23)
CO2: 24 mmol/L (ref 22–32)
Calcium: 8.7 mg/dL — ABNORMAL LOW (ref 8.9–10.3)
Chloride: 105 mmol/L (ref 98–111)
Creatinine, Ser: 0.5 mg/dL (ref 0.44–1.00)
GFR, Estimated: >60 mL/min (ref >60)
Glucose, Bld: 92 mg/dL (ref 70–99)
Potassium: 3.8 mmol/L (ref 3.5–5.1)
Sodium: 139 mmol/L (ref 135–145)

## 2024-05-18 MED ORDER — IOHEXOL 9 MG/ML PO SOLN
ORAL | Status: AC
  Filled 2024-05-18: qty 1000

## 2024-05-18 MED ORDER — MELATONIN 3 MG PO TABS
6.0000 mg | ORAL_TABLET | Freq: Once | ORAL | Status: DC
  Filled 2024-05-18: qty 2

## 2024-05-18 MED ORDER — MORPHINE SULFATE (PF) 2 MG/ML IV SOLN
1.0000 mg | INTRAVENOUS | Status: DC | PRN
  Administered 2024-05-18 – 2024-05-19 (×2): 1 mg via INTRAVENOUS
  Filled 2024-05-18 (×2): qty 1

## 2024-05-18 MED ORDER — SODIUM CHLORIDE 0.9 % IV SOLN: INTRAVENOUS | Status: DC

## 2024-05-18 MED ORDER — IOHEXOL 9 MG/ML PO SOLN
500.0000 mL | ORAL | Status: AC
  Administered 2024-05-18: 500 mL via ORAL

## 2024-05-18 MED ORDER — APIXABAN 2.5 MG PO TABS
2.5000 mg | ORAL_TABLET | Freq: Two times a day (BID) | ORAL | Status: DC
  Administered 2024-05-18 – 2024-05-21 (×6): 2.5 mg via ORAL
  Filled 2024-05-18 (×6): qty 1

## 2024-05-18 MED ORDER — HYDROCODONE-ACETAMINOPHEN 5-325 MG PO TABS
1.0000 | ORAL_TABLET | Freq: Four times a day (QID) | ORAL | Status: DC | PRN
  Administered 2024-05-18 – 2024-05-21 (×9): 1 via ORAL
  Filled 2024-05-18 (×9): qty 1

## 2024-05-18 NOTE — Progress Notes (Signed)
 PROGRESS NOTE  Stacey  DERRIANA [name]  NGE:[id number] DOB: 1946/05/30 DOA: 05/15/2024 PCP: [name], MD  Consultants  Brief Narrative: 78 y.o. female with a PMH significant for biliary stricture status post stenting, ascending cholangitis, breast cancer metastasized to bone (follows at [facility]), pathological L1 fracture, intrathecal pump (morphine /bupivacaine ), pulmonary embolism, atrial fibrillation, coronary artery disease with prior stenting, chronic kidney disease stage IIIb, left renal mass which is possibly cancer, COPD, anxiety and depression.  Brought to the emergency department with worsening epigastric/abdominal pain.  As well as bloating and infrequent vomiting.  Admitted for the same.  Gastroenterology following.  Working diagnosis is PUD/constipation.   Assessment & Plan:  Abdominal pain -Much more acute today than has been.  Pt crying in room, calling out in pain.   - reports acute onset this AM in RLQ radiating to Right flank and pain, different than the epigastric pain she's been having - still no BM, so could be acutely worsening abd pain.  However, degree of her pain concerns me and we are repeating CT abd/pelvis this AM.  Also added hydrocodone  to pain regimen, only on APAP currently.  She's still able to take PO. - Otherwise, continue treating for combination of PUD plus constipation -- PPI/carafate/miralax   Constipation: - Still with no bowel movement.  She has been struggling somewhat today with finishing her MiraLAX . - Await CT findings for this more acute pain - start linzess if CT negative and still no BM.    Right lower extremity edema - h/o PE in 2024 - with chronic RLE edema -- dopplers negative here - was on Eliquis  prior to this admission   Atrial fibrillation, chronic Coronary artery disease s/p stenting Hypertension - Based on review of her medical records, Eliquis  and Plavix  were paused on 5/25 at hospital DC -- sounds like at least plavix /Eliquis  resumed per  family.   - Heme note from 6/3 reports continuing eliquis  indefinitely.  Will resume - Continue metoprolol  and amlodipine -hold losartan  - restart home meds on discharge with Cardiology follow-up.     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 (Active)  03/09/24 4010  Location: Sacrum  Location Orientation:   Staging: Stage 2 -  Partial thickness loss of dermis presenting as a shallow open injury with a red, pink wound bed without slough.  Wound Description (Comments):   DO NOT USE:  Present on Admission: Yes  Dressing Type Foam - Lift dressing to assess site every shift 05/18/24 0730   DVT prophylaxis:  enoxaparin  (LOVENOX ) injection 40 mg Start: 05/15/24 2100 --> restart eliquis   Code Status:   Code Status: Limited: Do not attempt resuscitation (DNR) -DNR-LIMITED -Do Not Intubate/DNI  Level of care: Med-Surg Status is: Observation Dispo: Pending further resolution of pain.   Consults called: Gastroenterology  Subjective: Patient lying in bed, crying, calling out in pain.  Reports RLQ pain that is  radiating to her back.  Started acutely this AM, awoke her from sleep.  Unsure is she still has epigastric pain b/c of degree of pain in RLQ.  No N/V.  Still no bowel movement.  Took APAP about 20 mins prior without relief.    Objective: Vitals:   05/17/24 0433 05/17/24 1501 05/17/24 2055 05/18/24 0436  BP: (!) 116/54 114/77 (!)  109/56 (!) 110/44  Pulse: 98 92 86 93  Resp: 17 18 17 17   Temp: 98.2 F (36.8 C) 98.7 F (37.1 C) 98.5 F (36.9 C) 98.4 F (36.9 C)  TempSrc: Oral Oral Oral Oral  SpO2: 98% 99% 97% 98%  Weight:      Height:        Intake/Output Summary (Last 24 hours) at 05/18/2024 0855 Last data filed at 05/18/2024 0440 Gross per 24 hour  Intake --  Output 700 ml  Net -700 ml   Filed Weights   05/15/24 1530  Weight: 79.4 kg   Body mass index is 34.18 kg/m.  Gen: 78 y.o. female in no apparent distress.  Nontoxic Pulm: Non-labored breathing.  Clear to auscultation bilaterally.  CV: Irregular rhythm but normal rate GI: Abdomen soft and nondistended.  Tender to palpation today in RLQ, feels like palpable stool burden present.  No rebound tenderness or guarding.  Hypoactive but present bowel sounds Ext: Warm, no deformities.  +2 RLE edema, +1 LLE edema noted Skin: No rashes, lesions  Neuro: Alert and oriented. No focal neurological deficits. Psych: Calm  Judgement and insight appear normal. Mood & affect appropriate.    I have personally reviewed the following labs and images: CBC: Recent Labs  Lab 05/15/24 1530 05/16/24 0446 05/17/24 0426 05/18/24 0413  WBC 5.0 5.6 4.6 4.6  NEUTROABS 2.6  --   --   --   HGB 9.4* 9.3* 9.5* 9.8*  HCT 29.4* 29.6* 31.5* 31.7*  MCV 109.7* 110.0* 110.5* 110.5*  PLT 276 257 248 219   BMP &GFR Recent Labs  Lab 05/15/24 1530 05/16/24 0446 05/17/24 0426 05/18/24 0413  NA 137 137 138 139  K 3.8 4.5 4.3 3.8  CL 99 103 104 105  CO2 27 28 27 24   GLUCOSE 124* 101* 100* 92  BUN 20 18 13 11   CREATININE 0.60 0.47 0.56 0.50  CALCIUM  9.6 9.3 9.0 8.7*   Estimated Creatinine Clearance: 54.9 mL/min (by C-G formula based on SCr of 0.5 mg/dL). Liver & Pancreas: Recent Labs  Lab 05/15/24 1530 05/16/24 0446 05/17/24 0954  AST 69* 62* 68*  ALT 30 27 27   ALKPHOS 677* 661* 647*  BILITOT 1.7* 1.5* 1.4*  PROT 7.1 6.7 6.6  ALBUMIN  2.3* 2.1* 2.2*   Recent  Labs  Lab 05/15/24 1530  LIPASE 73*   No results for input(s): AMMONIA in the last 168 hours. Diabetic: No results for input(s): HGBA1C in the last 72 hours. No results for input(s): GLUCAP in the last 168 hours. Cardiac Enzymes: No results for input(s): CKTOTAL, CKMB, CKMBINDEX, TROPONINI in the last 168 hours. No results for input(s): PROBNP in the last 8760 hours. Coagulation Profile: No results for input(s): INR, PROTIME in the last 168 hours. Thyroid  Function Tests: No results for input(s): TSH, T4TOTAL, FREET4, T3FREE, THYROIDAB in the last 72 hours. Lipid Profile: No results for input(s): CHOL, HDL, LDLCALC, TRIG, CHOLHDL, LDLDIRECT in the last 72 hours. Anemia Panel: No results for input(s): VITAMINB12, FOLATE, FERRITIN, TIBC, IRON, RETICCTPCT in the last 72 hours. Urine analysis:    Component Value  Date/Time   COLORURINE AMBER (A) 03/08/2024 1943   APPEARANCEUR CLOUDY (A) 03/08/2024 1943   LABSPEC 1.008 03/08/2024 1943   PHURINE 5.0 03/08/2024 1943   GLUCOSEU NEGATIVE 03/08/2024 1943   HGBUR LARGE (A) 03/08/2024 1943   BILIRUBINUR NEGATIVE 03/08/2024 1943   KETONESUR NEGATIVE 03/08/2024 1943   PROTEINUR 100 (A) 03/08/2024 1943   NITRITE NEGATIVE 03/08/2024 1943   LEUKOCYTESUR LARGE (A) 03/08/2024 1943   Sepsis Labs: Invalid input(s): PROCALCITONIN, LACTICIDVEN  Microbiology: No results found for this or any previous visit (from the past 240 hours).  Radiology Studies: No results found.   Scheduled Meds:  amLODipine   5 mg Oral Daily   DULoxetine   30 mg Oral BID   enoxaparin  (LOVENOX ) injection  40 mg Subcutaneous Q24H   melatonin  6 mg Oral Once   metoprolol  succinate  75 mg Oral Daily   pantoprazole  (PROTONIX ) IV  40 mg Intravenous BID AC   polyethylene glycol  17 g Oral BID   sucralfate  1 g Oral TID AC & HS   Continuous Infusions:   LOS: 0 days   35 minutes with more than 50% spent in  reviewing records, counseling patient/family and coordinating care.  Trenton Frock, MD [facility] [email or web address] 05/18/2024, 8:55 AM

## 2024-05-18 NOTE — Plan of Care (Signed)

## 2024-05-18 NOTE — Progress Notes (Signed)
 Subjective: Feeling somewhat poorly today. She denies upper abdominal pain, nausea, vomiting. No BMs today. Having some RLQ pain radiating into her R lower back. Denies any urinary symptoms.   Objective: Vital signs in last 24 hours: Temp:  [98.4 F (36.9 C)-98.7 F (37.1 C)] 98.4 F (36.9 C) (06/18 0436) Pulse Rate:  [86-93] 93 (06/18 0436) Resp:  [17-18] 17 (06/18 0436) BP: (109-114)/(44-77) 110/44 (06/18 0436) SpO2:  [97 %-99 %] 98 % (06/18 0436) Last BM Date : 05/14/24 General:   Alert and oriented, pleasant Head:  Normocephalic and atraumatic. Eyes:  No icterus, sclera clear. Conjuctiva pink.  Mouth:  Without lesions, mucosa pink and moist.  Heart:  S1, S2 present, no murmurs noted.  Lungs: Clear to auscultation bilaterally, without wheezing, rales, or rhonchi.  Abdomen:  Bowel sounds present, soft, non-distended. No HSM or hernias noted. No rebound or guarding. No masses appreciated. TTP of upper abdomen, diffuse but worse in epigastric region. TTP of RLQ as well.  Neurologic:  Alert and  oriented x4;  grossly normal neurologically. Skin:  Warm and dry, intact without significant lesions.  Psych:  Alert and cooperative. Normal mood and affect.  Intake/Output from previous day: 06/17 0701 - 06/18 0700 In: -  Out: 700 [Urine:700] Intake/Output this shift: No intake/output data recorded.  Lab Results: Recent Labs    05/16/24 0446 05/17/24 0426 05/18/24 0413  WBC 5.6 4.6 4.6  HGB 9.3* 9.5* 9.8*  HCT 29.6* 31.5* 31.7*  PLT 257 248 219   BMET Recent Labs    05/16/24 0446 05/17/24 0426 05/18/24 0413  NA 137 138 139  K 4.5 4.3 3.8  CL 103 104 105  CO2 28 27 24   GLUCOSE 101* 100* 92  BUN 18 13 11   CREATININE 0.47 0.56 0.50  CALCIUM  9.3 9.0 8.7*   LFT Recent Labs    05/15/24 1530 05/16/24 0446 05/17/24 0954  PROT 7.1 6.7 6.6  ALBUMIN  2.3* 2.1* 2.2*  AST 69* 62* 68*  ALT 30 27 27   ALKPHOS 677* 661* 647*  BILITOT 1.7* 1.5* 1.4*  BILIDIR  --   --  0.5*   IBILI  --   --  0.9   Studies/Results: US  Venous Img Lower Unilateral Right (DVT) Result Date: 05/16/2024 CLINICAL DATA:  Right leg pain and swelling EXAM: Right LOWER EXTREMITY VENOUS DOPPLER ULTRASOUND TECHNIQUE: Gray-scale sonography with compression, as well as color and duplex ultrasound, were performed to evaluate the deep venous system(s) from the level of the common femoral vein through the popliteal and proximal calf veins. COMPARISON:  None Available. FINDINGS: VENOUS Normal compressibility of the common femoral, superficial femoral, and popliteal veins, as well as the visualized calf veins. Visualized portions of profunda femoral vein and great saphenous vein unremarkable. No filling defects to suggest DVT on grayscale or color Doppler imaging. Doppler waveforms show normal direction of venous flow, normal respiratory plasticity and response to augmentation. Limited views of the contralateral common femoral vein are unremarkable. OTHER None. Limitations: none IMPRESSION: No evidence of right lower extremity DVT. Electronically Signed   By: Adrianna Horde M.D.   On: 05/16/2024 11:14    Assessment: Stacey [name] is a 78 year old female  with history of metastatic breast cancer to bone on Faslodex  monthly and has intrathecal pump,possible liver lesion and will be evaluated with repeat staging in 3 months, cholangitis April and May 2025 with stent placement at [facility] as noted above, duodenal ulcers April 2024 noted at time of first ERCP, PE,  afib, CAD, CKD, left renal mass concerning for cancer, COPD, anxiety, depression, on Eliquis  and Plavix , presenting to [facility] Sunday with several day history of epigastric pain that is new to her from baseline pain. GI consulted.   Epigastric pain:  -New onset over the last several days.   -Associated intermittent nausea/vomiting.   -Known history of duodenal ulcers and has been on PPI.   -CBD stent does not appear occluded, currently without signs of  cholangitis.   -Elevated alk phos may be driven by metastatic disease. -No EGD recommended at this time though and may need to consider if patient fails to improve -PPI was resumed, Carafate added -Needs good bowel regimen for constipation as this  may be contributing   RLQ pain:  -Onset today.  -Radiating into flank -No urinary symptoms -Pending CT A/P for further evaluation   Hx of duodenal ulcer:  -Noted at time of ERCP in April 2025.  Oozing duodenal ulcers at that time -No biopsies done -No H. pylori serologies on file -As above, not planning for EGD unless overt bleeding or failure to improve with the above interventions -On PPI and Carafate  Anemia:  -Chronic since 2021 -Baseline hemoglobin 10-11 until March/April when hemoglobin declined down to 6.7 -Found to have a oozing duodenal ulcer at that time during ERCP -Hemoglobin dropped to 6.6 on 05/01/2024, received transfusion in the ED -Hemoglobin stable around 9 this admission (9.8 today) -No previous colonoscopy -No overt GI bleeding -No iron deficiency in April 2025 -Will continue to monitor, no indication for inpatient endoscopic evaluation at this time  Constipation: -Chronic, uncontrolled -no BMs so far today -Continue MiraLAX  twice daily for now -consider initiation of Linzess if CT negative and no BMs by this afternoon -Bowel prep discussed that patient did not think she can tolerate this   Plan: Continue miralax  17g BID Zofran  4mg  Q6h PRN Consider addition of linzess if no BMs today Continue IV PPI BID Continue carafate 1g QID Full liquid diet Trend h&h, monitor for overt GI bleeding Could consider inpatient EGD if hgb drops, epigastric pain fails to improve Follow for pending CT A/P results for RLQ pain evaluation    LOS: 0 days    05/18/2024, 8:55 AM   Shelena Castelluccio L. Sachin Ferencz, MSN, APRN, AGNP-C Adult-Gerontology Nurse Practitioner [facility]enter[facility] at [facility]

## 2024-05-19 DIAGNOSIS — K279 Peptic ulcer, site unspecified, unspecified as acute or chronic, without hemorrhage or perforation: Secondary | ICD-10-CM | POA: Diagnosis not present

## 2024-05-19 DIAGNOSIS — K5900 Constipation, unspecified: Secondary | ICD-10-CM | POA: Diagnosis not present

## 2024-05-19 DIAGNOSIS — R1013 Epigastric pain: Secondary | ICD-10-CM | POA: Diagnosis not present

## 2024-05-19 LAB — COMPREHENSIVE METABOLIC PANEL WITH GFR
ALT: 27 U/L (ref 0–44)
AST: 61 U/L — ABNORMAL HIGH (ref 15–41)
Albumin: 2.1 g/dL — ABNORMAL LOW (ref 3.5–5.0)
Alkaline Phosphatase: 574 U/L — ABNORMAL HIGH (ref 38–126)
Anion gap: 7 (ref 5–15)
BUN: 9 mg/dL (ref 8–23)
CO2: 22 mmol/L (ref 22–32)
Calcium: 8.1 mg/dL — ABNORMAL LOW (ref 8.9–10.3)
Chloride: 110 mmol/L (ref 98–111)
Creatinine, Ser: 0.44 mg/dL (ref 0.44–1.00)
GFR, Estimated: >60 mL/min (ref >60)
Glucose, Bld: 86 mg/dL (ref 70–99)
Potassium: 3.8 mmol/L (ref 3.5–5.1)
Sodium: 139 mmol/L (ref 135–145)
Total Bilirubin: 1.1 mg/dL (ref 0.0–1.2)
Total Protein: 6.3 g/dL — ABNORMAL LOW (ref 6.5–8.1)

## 2024-05-19 LAB — CBC
HCT: 32.4 % — ABNORMAL LOW (ref 36.0–46.0)
Hemoglobin: 9.8 g/dL — ABNORMAL LOW (ref 12.0–15.0)
MCH: 34.4 pg — ABNORMAL HIGH (ref 26.0–34.0)
MCHC: 30.2 g/dL (ref 30.0–36.0)
MCV: 113.7 fL — ABNORMAL HIGH (ref 80.0–100.0)
Platelets: 200 10*3/uL (ref 150–400)
RBC: 2.85 MIL/uL — ABNORMAL LOW (ref 3.87–5.11)
RDW: 18.5 % — ABNORMAL HIGH (ref 11.5–15.5)
WBC: 4.7 10*3/uL (ref 4.0–10.5)
nRBC: 0 % (ref 0.0–0.2)

## 2024-05-19 MED ORDER — ZINC OXIDE 40 % EX OINT
TOPICAL_OINTMENT | Freq: Three times a day (TID) | CUTANEOUS | Status: DC | PRN
  Filled 2024-05-19: qty 57

## 2024-05-19 NOTE — Plan of Care (Signed)

## 2024-05-19 NOTE — Progress Notes (Addendum)
 PROGRESS NOTE  Stacey [name]  UJW:[id number] DOB: Oct 31, 1946 DOA: 05/15/2024 PCP: [name], MD  Consultants  Brief Narrative: 78 y.o. female with a PMH significant for biliary stricture status post stenting, ascending cholangitis, breast cancer metastasized to bone (follows at [facility]), pathological L1 fracture, intrathecal pump (morphine /bupivacaine ), pulmonary embolism, atrial fibrillation, coronary artery disease with prior stenting, chronic kidney disease stage IIIb, left renal mass which is possibly cancer, COPD, anxiety and depression.  Brought to the emergency department with worsening epigastric/abdominal pain.  As well as bloating and infrequent vomiting.  Admitted for the same.  Gastroenterology following.  Working diagnosis is PUD/constipation.   Assessment & Plan:  Abdominal pain - Pain much better today compared to yesterday.  She did have a large bowel movement yesterday evening which helped relieved her lower abdominal pain - CT scan yesterday was negative for any acute issues or changes since prior CT done on admission - Still some vague right lower quadrant pain but now pain complaints mostly returned to epigastrium. -Reproducible with palpation in epigastrium.  No chest pain.  No trouble breathing.  Broadening differential, low likelihood this is ACS based on epigastric nature of pain worse with food intake.  Mesenteric ischemia a possibility based on how comfortable she appears today outside of direct epigastric palpation - She is on PPI twice daily plus Carafate.  Appreciate GI input. - Earlier this week GI did mention possible EGD, but now holding unless overt bleeding or failure to improve. -She was started on hydrocodone /morphine  yesterday.  Will stop the morphine  to prevent worsening of constipation.  Will continue bowel regimen  - Of note she does have compression fracture with severe spinal stenosis so this likely contributing to some degree of her pain especially the  pain she is having in her back  Elevated alk phos: - most likely elevated secondary to metastatic disease. - Does have CBD stent in place.  GI team not likely leak  Constipation: - Finally had bowel movement yesterday.  Continue with bowel regimen especially in light of her narcotic use to control her pain. - start linzess if CT negative and still no BM.   Macrocytic anemia: - B12 levels elevated  - macrocytosis likely secondary to chemotherapy. -Hemoglobin stable here   Right lower extremity edema - h/o PE in 2024 - with chronic RLE edema -- dopplers negative here - was on Eliquis  prior to this admission, continued here   Atrial fibrillation, chronic Coronary artery disease s/p stenting Hypertension - Based on review of her medical records, Eliquis  and Plavix  were paused on 5/25 at hospital DC -- sounds like at least plavix /Eliquis  resumed per family.   - Heme note from 6/3 reports continuing eliquis  indefinitely.  We have thus resumed her Eliquis  - Continue metoprolol  and amlodipine -hold losartan  - restart home meds on discharge with Cardiology follow-up.     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 (Active)  03/09/24 8295  Location: Sacrum  Location Orientation:   Staging: Stage 2 -  Partial thickness loss of dermis presenting as a shallow open injury with a red, pink wound bed without slough.  Wound Description  (Comments):   DO NOT USE:  Present on Admission: Yes  Dressing Type Foam - Lift dressing to assess site every shift 05/18/24 2000   DVT prophylaxis:  apixaban  (ELIQUIS ) tablet 2.5 mg Start: 05/18/24 1100 --> restart eliquis  apixaban  (ELIQUIS ) tablet 2.5 mg  Code Status:   Code Status: Limited: Do not attempt resuscitation (DNR) -DNR-LIMITED -Do Not Intubate/DNI  Level of care: Med-Surg Status is: Observation Dispo: Pending further resolution of pain back to facility.  Consults called: Gastroenterology  Subjective: Much less pain today.  She did have some hydrocodone  and morphine  yesterday evening which helped with her pain.  Felt much better after bowel movement yesterday evening.  Now pain is mostly in her epigastrium.  Objective: Vitals:   05/18/24 0436 05/18/24 1527 05/18/24 2143 05/19/24 0545  BP: (!) 110/44 115/64 124/78 (!) 130/54  Pulse: 93 85 82 73  Resp: 17 16 18 17   Temp: 98.4 F (36.9 C) 97.7 F (36.5 C) 97.6 F (36.4 C) 97.6 F (36.4 C)  TempSrc: Oral Oral Oral Oral  SpO2: 98% 100% 97% 100%  Weight:      Height:        Intake/Output Summary (Last 24 hours) at 05/19/2024 1058 Last data filed at 05/19/2024 0900 Gross per 24 hour  Intake 620.67 ml  Output 850 ml  Net -229.33 ml   Filed Weights   05/15/24 1530  Weight: 79.4 kg   Body mass index is 34.18 kg/m.  Gen: 78 y.o. female in no apparent distress.  Nontoxic Pulm: Non-labored breathing.  Clear to auscultation bilaterally.  CV: Irregular rhythm but normal rate GI: Abdomen soft and nondistended.  Tender to palpation in epigastrum moderately.  Mild RLQ tenderness.  Hypoactive but present bowel sounds Ext: Warm, no deformities.  +2 RLE edema, +1 LLE edema noted Skin: No rashes, lesions  Neuro: Alert and oriented. No focal neurological deficits. Psych: Calm  Judgement and insight appear normal. Mood & affect appropriate.    I have personally reviewed the following labs and images: CBC: Recent Labs   Lab 05/15/24 1530 05/16/24 0446 05/17/24 0426 05/18/24 0413 05/19/24 0419  WBC 5.0 5.6 4.6 4.6 4.7  NEUTROABS 2.6  --   --   --   --   HGB 9.4* 9.3* 9.5* 9.8* 9.8*  HCT 29.4* 29.6* 31.5* 31.7* 32.4*  MCV 109.7* 110.0* 110.5* 110.5* 113.7*  PLT 276 257 248 219 200   BMP &GFR Recent Labs  Lab 05/15/24 1530 05/16/24 0446 05/17/24 0426 05/18/24 0413 05/19/24 0419  NA 137 137 138 139 139  K 3.8 4.5 4.3 3.8 3.8  CL 99 103 104 105 110  CO2 27 28 27 24 22   GLUCOSE 124* 101* 100* 92 86  BUN 20 18 13 11 9   CREATININE 0.60 0.47 0.56 0.50 0.44  CALCIUM  9.6 9.3 9.0 8.7* 8.1*   Estimated Creatinine Clearance: 54.9 mL/min (by C-G formula based on SCr of 0.44 mg/dL). Liver & Pancreas: Recent Labs  Lab 05/15/24 1530 05/16/24 0446 05/17/24 0954 05/19/24 0419  AST 69* 62* 68* 61*  ALT 30 27 27 27   ALKPHOS 677* 661* 647* 574*  BILITOT 1.7* 1.5* 1.4* 1.1  PROT 7.1 6.7 6.6 6.3*  ALBUMIN  2.3* 2.1* 2.2* 2.1*  Recent Labs  Lab 05/15/24 1530  LIPASE 73*   No results for input(s): AMMONIA in the last 168 hours. Diabetic: No results for input(s): HGBA1C in the last 72 hours. No results for input(s): GLUCAP in the last 168 hours. Cardiac Enzymes: No results for input(s): CKTOTAL, CKMB, CKMBINDEX, TROPONINI in the last 168 hours. No results for input(s): PROBNP in the last 8760 hours. Coagulation Profile: No results for input(s): INR, PROTIME in the last 168 hours. Thyroid  Function Tests: No results for input(s): TSH, T4TOTAL, FREET4, T3FREE, THYROIDAB in the last 72 hours. Lipid Profile: No results for input(s): CHOL, HDL, LDLCALC, TRIG, CHOLHDL, LDLDIRECT in the last 72 hours. Anemia Panel: No results for input(s): VITAMINB12, FOLATE, FERRITIN, TIBC, IRON, RETICCTPCT in the last 72 hours. Urine analysis:    Component Value Date/Time   COLORURINE YELLOW 05/18/2024 1737   APPEARANCEUR CLEAR 05/18/2024 1737   LABSPEC  1.013 05/18/2024 1737   PHURINE 5.0 05/18/2024 1737   GLUCOSEU NEGATIVE 05/18/2024 1737   HGBUR NEGATIVE 05/18/2024 1737   BILIRUBINUR NEGATIVE 05/18/2024 1737   KETONESUR NEGATIVE 05/18/2024 1737   PROTEINUR NEGATIVE 05/18/2024 1737   NITRITE NEGATIVE 05/18/2024 1737   LEUKOCYTESUR TRACE (A) 05/18/2024 1737   Sepsis Labs: Invalid input(s): PROCALCITONIN, LACTICIDVEN  Microbiology: No results found for this or any previous visit (from the past 240 hours).  Radiology Studies: CT ABDOMEN PELVIS WO CONTRAST Result Date: 05/18/2024 CLINICAL DATA:  Worsening abdominal pain. EXAM: CT ABDOMEN AND PELVIS WITHOUT CONTRAST TECHNIQUE: Multidetector CT imaging of the abdomen and pelvis was performed following the standard protocol without IV contrast. RADIATION DOSE REDUCTION: This exam was performed according to the departmental dose-optimization program which includes automated exposure control, adjustment of the mA and/or kV according to patient size and/or use of iterative reconstruction technique. COMPARISON:  05/15/2024. FINDINGS: Lower chest: No acute abnormality. Multivessel coronary artery calcifications. Hepatobiliary: Persistent unchanged pneumobilia with common bile duct stent in place. Stent appears patent. No focal liver abnormality is seen. Gallbladder is surgically absent. Pancreas: Similar prominence of the pancreatic duct measuring up to 4 mm. No surrounding inflammatory changes. Spleen: Unremarkable. Adrenals/Urinary Tract: Adrenal glands are unremarkable. Unchanged complex mass in the mid left kidney, better delineated and measuring up to 3.1 cm on the prior contrast-enhanced CT dated 05/15/2024. No urolithiasis or hydronephrosis. Bladder is unremarkable. Stomach/Bowel: Stomach is within normal limits. No evidence of obstruction. Enteric contrast is visualized to the level of the sigmoid colon. Moderate amount of retained stool is again noted within the colon and rectum. Sigmoid colonic  diverticulosis without evidence of acute diverticulitis. Vascular/Lymphatic: Aortic atherosclerosis. Redemonstrated possible calcified dissection flap versus penetrating ulcer in the distal abdominal aorta. No enlarged abdominal or pelvic lymph nodes. Reproductive: Status post hysterectomy. No adnexal masses. Other: No abdominopelvic ascites. No intraperitoneal free air. Implanted pump device in the anterior right abdomen. Musculoskeletal: Redemonstrated severe compression deformity of L1 with retropulsion resulting in severe spinal canal stenosis and levocurvature. Unchanged sclerosis of the superior L2 endplate. Multilevel degenerative changes of the thoracolumbar spine. IMPRESSION: 1. No acute localizing findings in the abdomen or pelvis. 2. Unchanged complex mass in the mid left kidney, compatible with known renal cell carcinoma, better delineated and measuring up to 3.1 cm on the prior contrast-enhanced CT dated 05/15/2024. 3. Persistent unchanged pneumobilia with patent common bile duct stent in place. 4. Redemonstrated severe compression deformity of L1 with retropulsion resulting in severe spinal canal stenosis and levocurvature. 5. Additional unchanged chronic findings, as above. 6.  Aortic Atherosclerosis (ICD10-I70.0).  Electronically Signed   By: Mannie Seek M.D.   On: 05/18/2024 17:03     Scheduled Meds:  amLODipine   5 mg Oral Daily   apixaban   2.5 mg Oral BID   DULoxetine   30 mg Oral BID   melatonin  6 mg Oral Once   metoprolol  succinate  75 mg Oral Daily   pantoprazole  (PROTONIX ) IV  40 mg Intravenous BID AC   polyethylene glycol  17 g Oral BID   sucralfate  1 g Oral TID AC & HS   Continuous Infusions:   LOS: 1 day   35 minutes with more than 50% spent in reviewing records, counseling patient/family and coordinating care.  Trenton Frock, MD [facility] [email or web address] 05/19/2024, 10:58 AM

## 2024-05-19 NOTE — Progress Notes (Signed)
 Gastroenterology Progress Note   Referring Provider: No ref. provider found Primary Care Physician:  [name], MD Primary Gastroenterologist:  Dr. Sammi Crick in past, recently seen inpatient by [facility]  Patient ID: Stacey [name]; [id number]; 1945/12/14   Subjective:    Visiting with her sister. Patient states her pain is better today. She has pain in epigastric region and in right lower abdomen. She did not really like her breakfast. No n/v. States she has had BM yesterday and today. Finally moving. She is not aware of color of stools. Nursing staff, documented black/green in color. Patient notes her right sided pain is worse when turning over in the bed. Feels like her pump in being pulled.   Objective:   Vital signs in last 24 hours: Temp:  [97.6 F (36.4 C)-97.7 F (36.5 C)] 97.6 F (36.4 C) (06/19 0545) Pulse Rate:  [73-85] 73 (06/19 0545) Resp:  [16-18] 17 (06/19 0545) BP: (115-130)/(54-78) 130/54 (06/19 0545) SpO2:  [97 %-100 %] 100 % (06/19 0545) Last BM Date : 05/14/24 General:   Alert,  Well-developed, well-nourished, pleasant and cooperative in NAD Head:  Normocephalic and atraumatic. Eyes:  Sclera clear, no icterus.  Chest: CTA bilaterally without rales, rhonchi, crackles.    Heart:  Regular rate and rhythm; no murmurs, clicks, rubs,  or gallops. Abdomen:  Soft,  nondistended. No masses, hepatosplenomegaly or hernias noted. Normal bowel sounds, without guarding, and without rebound.  Full in epigastric region, mild tenderness. Pump in right lower abdomen, she points to right of pump where she feels her pain, nontender to exam. Extremities:  Without clubbing, deformity or edema. Neurologic:  Alert and  oriented x4;  grossly normal neurologically. Skin:  Intact without significant lesions or rashes. Psych:  Alert and cooperative. Normal mood and affect.  Intake/Output from previous day: 06/18 0701 - 06/19 0700 In: 320.7 [I.V.:320.7] Out: 850  [Urine:850] Intake/Output this shift: Total I/O In: -  Out: 400 [Urine:400]  Lab Results: CBC Recent Labs    05/17/24 0426 05/18/24 0413 05/19/24 0419  WBC 4.6 4.6 4.7  HGB 9.5* 9.8* 9.8*  HCT 31.5* 31.7* 32.4*  MCV 110.5* 110.5* 113.7*  PLT 248 219 200   BMET Recent Labs    05/17/24 0426 05/18/24 0413 05/19/24 0419  NA 138 139 139  K 4.3 3.8 3.8  CL 104 105 110  CO2 27 24 22   GLUCOSE 100* 92 86  BUN 13 11 9   CREATININE 0.56 0.50 0.44  CALCIUM  9.0 8.7* 8.1*   LFTs Recent Labs    05/17/24 0954 05/19/24 0419  BILITOT 1.4* 1.1  BILIDIR 0.5*  --   IBILI 0.9  --   ALKPHOS 647* 574*  AST 68* 61*  ALT 27 27  PROT 6.6 6.3*  ALBUMIN  2.2* 2.1*   No results for input(s): LIPASE in the last 72 hours. PT/INR No results for input(s): LABPROT, INR in the last 72 hours.     Lab Results  Component Value Date   IRON 72 03/13/2024   TIBC 147 (L) 03/13/2024   FERRITIN 1,285 (H) 03/13/2024   Lab Results  Component Value Date   VITAMINB12 5,230 (H) 04/22/2024   Lab Results  Component Value Date   FOLATE 29.0 04/22/2024     Imaging Studies: CT ABDOMEN PELVIS WO CONTRAST Result Date: 05/18/2024 CLINICAL DATA:  Worsening abdominal pain. EXAM: CT ABDOMEN AND PELVIS WITHOUT CONTRAST TECHNIQUE: Multidetector CT imaging of the abdomen and pelvis was performed following the standard protocol without IV  contrast. RADIATION DOSE REDUCTION: This exam was performed according to the departmental dose-optimization program which includes automated exposure control, adjustment of the mA and/or kV according to patient size and/or use of iterative reconstruction technique. COMPARISON:  05/15/2024. FINDINGS: Lower chest: No acute abnormality. Multivessel coronary artery calcifications. Hepatobiliary: Persistent unchanged pneumobilia with common bile duct stent in place. Stent appears patent. No focal liver abnormality is seen. Gallbladder is surgically absent. Pancreas: Similar  prominence of the pancreatic duct measuring up to 4 mm. No surrounding inflammatory changes. Spleen: Unremarkable. Adrenals/Urinary Tract: Adrenal glands are unremarkable. Unchanged complex mass in the mid left kidney, better delineated and measuring up to 3.1 cm on the prior contrast-enhanced CT dated 05/15/2024. No urolithiasis or hydronephrosis. Bladder is unremarkable. Stomach/Bowel: Stomach is within normal limits. No evidence of obstruction. Enteric contrast is visualized to the level of the sigmoid colon. Moderate amount of retained stool is again noted within the colon and rectum. Sigmoid colonic diverticulosis without evidence of acute diverticulitis. Vascular/Lymphatic: Aortic atherosclerosis. Redemonstrated possible calcified dissection flap versus penetrating ulcer in the distal abdominal aorta. No enlarged abdominal or pelvic lymph nodes. Reproductive: Status post hysterectomy. No adnexal masses. Other: No abdominopelvic ascites. No intraperitoneal free air. Implanted pump device in the anterior right abdomen. Musculoskeletal: Redemonstrated severe compression deformity of L1 with retropulsion resulting in severe spinal canal stenosis and levocurvature. Unchanged sclerosis of the superior L2 endplate. Multilevel degenerative changes of the thoracolumbar spine. IMPRESSION: 1. No acute localizing findings in the abdomen or pelvis. 2. Unchanged complex mass in the mid left kidney, compatible with known renal cell carcinoma, better delineated and measuring up to 3.1 cm on the prior contrast-enhanced CT dated 05/15/2024. 3. Persistent unchanged pneumobilia with patent common bile duct stent in place. 4. Redemonstrated severe compression deformity of L1 with retropulsion resulting in severe spinal canal stenosis and levocurvature. 5. Additional unchanged chronic findings, as above. 6.  Aortic Atherosclerosis (ICD10-I70.0). Electronically Signed   By: Mannie Seek M.D.   On: 05/18/2024 17:03   US  Venous  Img Lower Unilateral Right (DVT) Result Date: 05/16/2024 CLINICAL DATA:  Right leg pain and swelling EXAM: Right LOWER EXTREMITY VENOUS DOPPLER ULTRASOUND TECHNIQUE: Gray-scale sonography with compression, as well as color and duplex ultrasound, were performed to evaluate the deep venous system(s) from the level of the common femoral vein through the popliteal and proximal calf veins. COMPARISON:  None Available. FINDINGS: VENOUS Normal compressibility of the common femoral, superficial femoral, and popliteal veins, as well as the visualized calf veins. Visualized portions of profunda femoral vein and great saphenous vein unremarkable. No filling defects to suggest DVT on grayscale or color Doppler imaging. Doppler waveforms show normal direction of venous flow, normal respiratory plasticity and response to augmentation. Limited views of the contralateral common femoral vein are unremarkable. OTHER None. Limitations: none IMPRESSION: No evidence of right lower extremity DVT. Electronically Signed   By: Adrianna Horde M.D.   On: 05/16/2024 11:14   CT ABDOMEN PELVIS W CONTRAST Result Date: 05/15/2024 CLINICAL DATA:  Abdominal pain, acute, nonlocalized.  Fever. EXAM: CT ABDOMEN AND PELVIS WITH CONTRAST TECHNIQUE: Multidetector CT imaging of the abdomen and pelvis was performed using the standard protocol following bolus administration of intravenous contrast. RADIATION DOSE REDUCTION: This exam was performed according to the departmental dose-optimization program which includes automated exposure control, adjustment of the mA and/or kV according to patient size and/or use of iterative reconstruction technique. CONTRAST:  OMNIPAQUE  IOHEXOL  300 MG/ML  SOLN COMPARISON:  05/01/2024. FINDINGS: Lower chest: Multivessel  coronary artery calcifications are noted. Atelectasis is noted at the lung bases. Hepatobiliary: No focal abnormality in the liver. There is pneumobilia with a common bile duct stent in place. The  stent appears patent. The gallbladder is surgically absent. Pancreas: The pancreatic duct is prominent at 4 mm. No surrounding inflammatory changes are seen. Spleen: Normal in size without focal abnormality. Adrenals/Urinary Tract: The adrenal glands are within normal limits. The kidneys enhance symmetrically. There is a complex enhancing mass in the mid left kidney measuring up to 3.1 cm. A cyst is noted in the lower pole the left kidney. No renal calculus or hydronephrosis bilaterally. The bladder is unremarkable. Stomach/Bowel: The stomach is within normal limits. No bowel obstruction, free air, or pneumatosis. A moderate amount of retained stool is present in the colon. A few scattered diverticula are present along the colon without evidence of diverticulitis. Appendix is not seen. Vascular/Lymphatic: Aortic atherosclerosis. There is redemonstration of a possible calcified dissection flap versus penetrating ulcer in the distal abdominal aorta. No enlarged abdominal or pelvic lymph nodes. Reproductive: Status post hysterectomy. No adnexal masses. Other: No abdominopelvic ascites. Implanted pump device is seen with in the anterior right abdomen Musculoskeletal: Degenerative changes are present in the thoracolumbar spine. There is severe spinal canal stenosis with associated compression deformity at the level L1 with retropulsed element, similar in appearance to the prior exam. Sclerosis of the superior endplate at L2 is unchanged. No new acute osseous abnormality is seen. IMPRESSION: 1. No acute intra-abdominal process. 2. Mild in overall large mint of complex enhancing mass in the left kidney, compatible with known renal cell carcinoma. 3. Pneumobilia with intrahepatic and extrahepatic biliary ductal dilatation and patent common bile duct stent in place. 4. Moderate amount of retained stool in the colon. 5. Stable compression deformity at L1 with retropulsed elements resulting in severe spinal canal stenosis. 6.  Coronary artery calcifications and aortic atherosclerosis. 7. Remaining chronic findings as described above. Electronically Signed   By: Wyvonnia Heimlich M.D.   On: 05/15/2024 16:49   DG Chest Port 1 View Result Date: 05/15/2024 CLINICAL DATA:  Shortness of breath.  Abdominal pain and vomiting. EXAM: PORTABLE CHEST 1 VIEW COMPARISON:  12/31/2023. FINDINGS: The heart size and mediastinal contours are within normal limits. There is atherosclerotic calcification of the aorta. No consolidation, effusion, or pneumothorax is seen. No acute osseous abnormality. Surgical clips are identified in the right upper quadrant. IMPRESSION: No active disease. Electronically Signed   By: Wyvonnia Heimlich M.D.   On: 05/15/2024 15:49   DG Shoulder Right Port Result Date: 05/03/2024 CLINICAL DATA:  Right shoulder pain and deformity. Right shoulder pain for several weeks. No injury. Patient is an inpatient receiving cancer treatment. EXAM: RIGHT SHOULDER - 1 VIEW COMPARISON:  None Available. FINDINGS: No evidence of acute fracture or dislocation. Mild increase in subacromial space may indicate effusion. No focal bone lesion or bone destruction. Coracoclavicular and acromioclavicular spaces are normal. Soft tissues are unremarkable. IMPRESSION: No acute bony abnormalities. Mild increase of subacromial space may indicate effusion. Electronically Signed   By: Boyce Byes M.D.   On: 05/03/2024 12:35   CT ABDOMEN PELVIS W CONTRAST Result Date: 05/01/2024 CLINICAL DATA:  Acute nonlocalized abdominal pain EXAM: CT ABDOMEN AND PELVIS WITH CONTRAST TECHNIQUE: Multidetector CT imaging of the abdomen and pelvis was performed using the standard protocol following bolus administration of intravenous contrast. RADIATION DOSE REDUCTION: This exam was performed according to the departmental dose-optimization program which includes automated exposure control, adjustment of  the mA and/or kV according to patient size and/or use of iterative  reconstruction technique. CONTRAST:  OMNIPAQUE  IOHEXOL  300 MG/ML  SOLN COMPARISON:  04/19/2024 FINDINGS: Lower chest: No acute abnormality. Extensive coronary artery calcification Hepatobiliary: Palliative metallic biliary stent again noted within the extrahepatic bile duct. Pneumobilia within the non dependent intrahepatic biliary tree suggest patency of the stented segment. Status post cholecystectomy. Previously noted hepatic hypodensities are not as well visualized on current examination suggesting that these may have reflected hepatic abscesses or intrahepatic bilomas and have resolved in the interval. No enhancing intrahepatic mass is identified. Pancreas: Unremarkable Spleen: Unremarkable Adrenals/Urinary Tract: The adrenal glands are unremarkable. The kidneys are normal in size and position. 2.7 cm heterogeneously enhancing solid mass within the pole the left kidney compatible with known renal cell carcinoma is unchanged. Additional simple cortical cysts are seen within the kidneys bilaterally. The kidneys are otherwise unremarkable. Bladder is unremarkable. Stomach/Bowel: Moderate colonic stool burden. Stomach, small bowel, and large bowel are otherwise unremarkable. No evidence of obstruction or focal inflammation. Appendix is absent. No free intraperitoneal gas or fluid. Vascular/Lymphatic: Extensive aortoiliac atherosclerotic calcification. Stable penetrating atherosclerotic ulcer versus short segment focal dissection measuring 1.5 x 2.5 x 0.9 cm in greatest dimension. No aortic aneurysm. No pathologic adenopathy within the abdomen and pelvis. Reproductive: Status post hysterectomy. No adnexal masses. Other: Implanted pump device noted within the subcutaneous right lower quadrant anterior abdominal wall. No abdominal wall hernia. Musculoskeletal: Pathologic compression fracture of L1 is again seen with comminution of the vertebral body and retropulsion of the posterior body fracture fragment by up  to 10 mm with resultant severe central canal stenosis with a residual AP diameter of the spinal canal of approximately 3-4 mm (29/2) severe resultant bilateral neuroforaminal narrowing at T12-L1 and L1-L2. Superimposed degenerative changes noted throughout the thoracolumbar spine. IMPRESSION: 1. Palliative metallic biliary stent again noted within the extrahepatic bile duct. Pneumobilia within the non dependent intrahepatic biliary tree suggest patency of the stented segment. 2. Previously noted hepatic hypodensities are not as well visualized on current examination suggesting that these may have reflected hepatic abscesses or intrahepatic bilomas and have resolved in the interval. No enhancing intrahepatic mass is identified. 3. 2.7 cm heterogeneously enhancing solid mass within the pole the left kidney compatible with known renal cell carcinoma is unchanged. 4. Pathologic compression fracture of L1 is again seen with comminution of the vertebral body and retropulsion of the posterior body fracture fragment by up to 10 mm with resultant severe central canal stenosis with a residual AP diameter of the spinal canal of approximately 3-4 mm. Severe resultant bilateral neuroforaminal narrowing at T12-L1 and L1-L2. Surgical consultation may be helpful for further management. 5. Stable penetrating atherosclerotic ulcer versus short segment focal dissection measuring 1.5 x 2.5 x 0.9 cm in greatest dimension. 6. Moderate colonic stool burden. No evidence of obstruction or focal inflammation. Aortic Atherosclerosis (ICD10-I70.0). Electronically Signed   By: Worthy Heads M.D.   On: 05/01/2024 19:30   CT ABDOMEN PELVIS WO CONTRAST Result Date: 04/19/2024 CLINICAL DATA:  Abdominal pain, acute, nonlocalized EXAM: CT ABDOMEN AND PELVIS WITHOUT CONTRAST TECHNIQUE: Multidetector CT imaging of the abdomen and pelvis was performed following the standard protocol without IV contrast. RADIATION DOSE REDUCTION: This exam was  performed according to the departmental dose-optimization program which includes automated exposure control, adjustment of the mA and/or kV according to patient size and/or use of iterative reconstruction technique. COMPARISON:  March 08, 2024 FINDINGS: Of note, the lack of intravenous  contrast limits evaluation of the solid organ parenchyma and vascularity. Lower chest: Small bilateral pleural effusions, slightly larger on the left than the right, with bibasilar atelectasis. Coronary artery atherosclerosis. Hepatobiliary: Multiple small hepatic hypodensities have developed in the interim. For example, in the posterior right hepatic dome there is a 1.7 cm hypodense lesion (axial 14). In the medial right hepatic lobe there is a 1.8 cm lesion (axial 19). Anterior left hepatic lobe lesion measuring 1.8 cm (axial 15). Cholecystectomy. Decompression of the intrahepatic biliary tree with diffuse pneumobilia. Well-positioned metallic biliary duct stent in the common bile duct. No radiopaque choledocholithiasis. Pancreas: No mass or main ductal dilation. No peripancreatic inflammation or fluid collection. Spleen: Normal size. No mass. Adrenals/Urinary Tract: No adrenal masses. Unchanged thick-walled cystic structure in the left kidney measuring 3.1 x 3 cm. No hydronephrosis or nephrolithiasis. The urinary bladder is completely decompressed. Stomach/Bowel: The stomach is decompressed without focal abnormality. No small bowel wall thickening or inflammation. No small bowel obstruction.Descending and sigmoid colonic diverticulosis. No changes of acute diverticulitis. Vascular/Lymphatic: No aortic aneurysm. Diffuse aortoiliac atherosclerosis. No intraabdominal or pelvic lymphadenopathy. Reproductive: Hysterectomy. No concerning adnexal mass. No free pelvic fluid. Other: No pneumoperitoneum. Small volume ascites predominantly in the left paracolic gutter. Musculoskeletal: No acute fracture or destructive lesion. Unchanged  peripherally calcified mass in the lateral right breast. Diffuse osteopenia. Multilevel degenerative disc disease of the spine. Large sclerotic lesion within the L2 vertebral body, unchanged. Severe pathologic compression fracture with retropulsion at L1 with retropulsion and moderate canal narrowing, unchanged. Multilevel degenerative disc disease of the spine. Diffuse anasarca. Infusion device in the right lower quadrant with a catheter extending into the thoracic spine, outside the field of view. IMPRESSION: 1. Interval development of multiple new hepatic hypodensities measuring up to 1.8 cm, worrisome for progressive metastatic disease. 2. Unchanged thick-walled left renal cystic lesion, consistent with renal cell carcinoma. 3. Small bilateral pleural effusions slightly larger on the right than the left. Small volume ascites, predominantly in the left paracolic gutter. Anasarca. This is likely related to the patient's volume status. 4. Unchanged bony metastases in L1 and L2 with severe pathologic compression fracture of L1 and moderate canal narrowing. Electronically Signed   By: Rance Burrows M.D.   On: 04/19/2024 21:13  [2 weeks]  Assessment:   Stacey [name] is a 78 year old female  with history of metastatic breast cancer to bone on Faslodex  monthly and has intrathecal pump,possible liver lesion and will be evaluated with repeat staging in 3 months, cholangitis April and May 2025 with stent placement at [facility] as noted above, duodenal ulcers April 2025 noted at time of first ERCP, PE, afib, CAD, CKD, left renal mass concerning for cancer, COPD, anxiety, depression, on Eliquis  and Plavix , presenting to [facility] Sunday with several day history of epigastric pain that is new to her from baseline pain. GI consulted.    Epigastric pain:  -New onset over the last several days.   -Associated intermittent nausea/vomiting.   -Known history of duodenal ulcers, found at time of ERCP in April, had been on  PPI but ran out for several days prior to admission.   -CBD stent does not appear occluded, currently without signs of cholangitis.   -Elevated alk phos may be driven by metastatic disease. -No EGD recommended at this time. -PPI was resumed, Carafate added - Constipation may have been contributing.    RLQ pain:  -Onset yesterday. However, she reports chronic intermittent symptoms and has felt it could be her  intrathecal pump shifting. She has mentioned this to her oncologist in the past. Per oncology note, she has cancer associated pain/RLQ pain (see 05/03/24 oncology note)  -Radiating into flank -No urinary symptoms -repeat CT A/P 6/18 with no new findings -benign abdominal exam today -?pain related to adhesions associated with intrathecal pump vs referred pain from severe central canal stenosis related to pathologic compression fracture of L1, severe bilateral neuroforaminal narrowing at T12-L1 and L1-L2.      Hx of duodenal ulcer:  -Noted at time of ERCP in April 2025.  Oozing duodenal ulcers at that time. No mention regarding duodenal ulcer on follow up ERCP 03/2024. -No biopsies done -No H. pylori serologies on file -As above, not planning for EGD unless overt bleeding or failure to improve with the above interventions -On PPI and Carafate   Anemia:  -Chronic since 2021 -Baseline hemoglobin 10-11 until March/April when hemoglobin declined down to 6.7 -Found to have a oozing duodenal ulcer at that time during ERCP -Hemoglobin dropped to 6.6 on 05/01/2024, received transfusion in the ED -Hemoglobin stable around 9 this admission (9.8 today) -No previous colonoscopy -No overt GI bleeding -No iron deficiency in April 2025.  -No folate/B12 deficiency in May 2025. -Will continue to monitor, no indication for inpatient endoscopic evaluation at this time   Constipation: -Chronic, uncontrolled -moving bowels now on MiraLAX  twice daily for now -black/green stool reported. Monitor  hemoglobin. Not sure if true melena.  Plan:   Continue miralax  17g twice daily. If does not provide effective stool evacuation, could consider Linzess  Continue PPI BID. Continue carafate 1g QID. Monitor for melena. Trend H/H.    LOS: 1 day   Trudie Fuse. Verlie Glisson [facility] [phone] 6/19/20256:03 AM

## 2024-05-20 ENCOUNTER — Telehealth: Payer: Self-pay | Admitting: Gastroenterology

## 2024-05-20 DIAGNOSIS — I482 Chronic atrial fibrillation, unspecified: Secondary | ICD-10-CM | POA: Diagnosis not present

## 2024-05-20 DIAGNOSIS — R109 Unspecified abdominal pain: Secondary | ICD-10-CM | POA: Diagnosis not present

## 2024-05-20 DIAGNOSIS — C50919 Malignant neoplasm of unspecified site of unspecified female breast: Secondary | ICD-10-CM | POA: Diagnosis not present

## 2024-05-20 LAB — COMPREHENSIVE METABOLIC PANEL WITH GFR
ALT: 26 U/L (ref 0–44)
AST: 60 U/L — ABNORMAL HIGH (ref 15–41)
Albumin: 2 g/dL — ABNORMAL LOW (ref 3.5–5.0)
Alkaline Phosphatase: 588 U/L — ABNORMAL HIGH (ref 38–126)
Anion gap: 4 — ABNORMAL LOW (ref 5–15)
BUN: 7 mg/dL — ABNORMAL LOW (ref 8–23)
CO2: 25 mmol/L (ref 22–32)
Calcium: 7.9 mg/dL — ABNORMAL LOW (ref 8.9–10.3)
Chloride: 108 mmol/L (ref 98–111)
Creatinine, Ser: 0.45 mg/dL (ref 0.44–1.00)
GFR, Estimated: >60 mL/min (ref >60)
Glucose, Bld: 102 mg/dL — ABNORMAL HIGH (ref 70–99)
Potassium: 3.9 mmol/L (ref 3.5–5.1)
Sodium: 137 mmol/L (ref 135–145)
Total Bilirubin: 1.2 mg/dL (ref 0.0–1.2)
Total Protein: 6 g/dL — ABNORMAL LOW (ref 6.5–8.1)

## 2024-05-20 LAB — CBC
HCT: 28.5 % — ABNORMAL LOW (ref 36.0–46.0)
Hemoglobin: 9.1 g/dL — ABNORMAL LOW (ref 12.0–15.0)
MCH: 35.3 pg — ABNORMAL HIGH (ref 26.0–34.0)
MCHC: 31.9 g/dL (ref 30.0–36.0)
MCV: 110.5 fL — ABNORMAL HIGH (ref 80.0–100.0)
Platelets: 211 10*3/uL (ref 150–400)
RBC: 2.58 MIL/uL — ABNORMAL LOW (ref 3.87–5.11)
RDW: 18.1 % — ABNORMAL HIGH (ref 11.5–15.5)
WBC: 4.2 10*3/uL (ref 4.0–10.5)
nRBC: 0 % (ref 0.0–0.2)

## 2024-05-20 MED ORDER — POLYETHYLENE GLYCOL 3350 17 G PO PACK: 17.0000 g | PACK | Freq: Two times a day (BID) | ORAL | 3 refills | Status: DC

## 2024-05-20 MED ORDER — ACETAMINOPHEN 325 MG PO TABS: 650.0000 mg | ORAL_TABLET | Freq: Four times a day (QID) | ORAL | Status: DC | PRN

## 2024-05-20 MED ORDER — SENNOSIDES-DOCUSATE SODIUM 8.6-50 MG PO TABS: 2.0000 | ORAL_TABLET | Freq: Every day | ORAL | 3 refills | Status: DC

## 2024-05-20 MED ORDER — ALPRAZOLAM 0.5 MG PO TABS: 0.5000 mg | ORAL_TABLET | Freq: Two times a day (BID) | ORAL | 0 refills | Status: AC | PRN

## 2024-05-20 MED ORDER — PANTOPRAZOLE SODIUM 40 MG PO TBEC: 40.0000 mg | DELAYED_RELEASE_TABLET | Freq: Two times a day (BID) | ORAL | 5 refills | Status: DC

## 2024-05-20 MED ORDER — SUCRALFATE 1 GM/10ML PO SUSP: 1.0000 g | Freq: Three times a day (TID) | ORAL | 0 refills | Status: DC

## 2024-05-20 MED ORDER — ALPRAZOLAM 0.5 MG PO TABS
0.5000 mg | ORAL_TABLET | Freq: Two times a day (BID) | ORAL | Status: DC | PRN
  Administered 2024-05-20: 0.5 mg via ORAL
  Filled 2024-05-20: qty 1

## 2024-05-20 NOTE — Progress Notes (Addendum)
 PROGRESS NOTE  Stacey [name]  B Stacey [name]  QIO:[id number] DOB: 1946/01/06 DOA: 05/15/2024 PCP: [name], MD  Consultants  Brief Narrative: 78 y.o. female with a PMH significant for biliary stricture status post stenting, ascending cholangitis, breast cancer metastasized to bone (follows at [facility]), pathological L1 fracture, intrathecal pump (morphine /bupivacaine ), pulmonary embolism, atrial fibrillation, coronary artery disease with prior stenting, chronic kidney disease stage IIIb, left renal mass which is possibly cancer, COPD, anxiety and depression.  Brought to the emergency department with worsening epigastric/abdominal pain.  As well as bloating and infrequent vomiting.  Admitted for the same.  Gastroenterology following.  Working diagnosis is PUD/constipation.   Assessment & Plan:  Abdominal pain - Pain much better today compared to yesterday.  She did have a large bowel movement yesterday evening which helped relieved her lower abdominal pain - CT scan yesterday was negative for any acute issues or changes since prior CT done on admission - Still some vague right lower quadrant pain but now pain complaints mostly returned to epigastrium. -Reproducible with palpation in epigastrium.  No chest pain.  No trouble breathing.  Broadening differential, low likelihood this is ACS based on epigastric nature of pain worse with food intake.  Mesenteric ischemia a possibility based on how comfortable she appears today outside of direct epigastric palpation - She is on PPI twice daily plus Carafate.  Appreciate GI input. - Earlier this week GI did mention possible EGD, but now holding unless overt bleeding or failure to improve. -She was started on hydrocodone /morphine  yesterday.  Will stop the morphine  to prevent worsening of constipation.  Will continue bowel regimen  - Of note she does have compression fracture with severe spinal stenosis so this likely contributing to some degree of her pain especially the  pain she is having in her back -- Possible discharge on 05/21/24 with outpatient GI follow-up-if Hgb remains stable -  Elevated alk phos: - most likely elevated secondary to metastatic disease. - Does have CBD stent in place.  GI team not likely leak  Constipation: - Continue with bowel regimen especially in light of her narcotic use to control her pain. - Macrocytic anemia: - B12 levels elevated  - macrocytosis likely secondary to chemotherapy. -Hemoglobin stable here   Right lower extremity edema - h/o PE in 2024 - with chronic RLE edema -- dopplers negative here - was on Eliquis  prior to this admission, continued here   Atrial fibrillation, chronic Coronary artery disease s/p stenting Hypertension - Based on review of her medical records, Eliquis  and Plavix  were paused on 5/25 at hospital DC -- sounds like at least plavix /Eliquis  resumed per family.   - Heme note from 6/3 reports continuing eliquis  indefinitely.  We have thus resumed her Eliquis  - Continue metoprolol  for rate control --BP soft so hold amlodipine  and-hold losartan  --Also amlodipine  may be contributing to increasing lower extremity edema - restart home meds on discharge with Cardiology follow-up.     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 (Active)  03/09/24 1610  Location: Sacrum  Location Orientation:   Staging: Stage 2 -  Partial thickness loss of  dermis presenting as a shallow open injury with a red, pink wound bed without slough.  Wound Description (Comments):   DO NOT USE:  Present on Admission: Yes  Dressing Type Foam - Lift dressing to assess site every shift 05/19/24 2036   DVT prophylaxis:  apixaban  (ELIQUIS ) tablet 2.5 mg Start: 05/18/24 1100 --> restart eliquis  apixaban  (ELIQUIS ) tablet 2.5 mg  Code Status:   Code Status: Limited: Do not attempt resuscitation (DNR) -DNR-LIMITED -Do Not Intubate/DNI  Level of care: Med-Surg Status is: Observation Dispo: Pending further resolution of pain ---d/c home with [facility]   Consults called: Gastroenterology  Subjective: Son Baker Bon at bedside, questions answered No fever  Or chills  - No additional BM - Abdominal pain improving  Objective: Vitals:   05/19/24 1347 05/19/24 2124 05/20/24 0504 05/20/24 1315  BP: 133/62 135/82 (!) 129/97 108/64  Pulse: 88 90 89 84  Resp: 17 20 20 19   Temp: 98 F (36.7 C) 98.2 F (36.8 C) 98.3 F (36.8 C) 98.2 F (36.8 C)  TempSrc: Oral Oral Oral   SpO2: 95% 93% 99% 100%  Weight:      Height:        Intake/Output Summary (Last 24 hours) at 05/20/2024 1819 Last data filed at 05/20/2024 1300 Gross per 24 hour  Intake 720 ml  Output --  Net 720 ml   Filed Weights   05/15/24 1530  Weight: 79.4 kg   Body mass index is 34.18 kg/m.  Gen: 78 y.o. female in no apparent distress.  Nontoxic Pulm: Non-labored breathing.  Clear to auscultation bilaterally.  CV: Irregular rhythm but normal rate GI: Abdomen soft and nondistended.  Much improved abdominal discomfort hypoactive but present bowel sounds, pain pump located in right lower quadrant area Ext: Warm, no deformities.  +2 RLE edema, +1 LLE edema noted Skin: No rashes, lesions  Neuro: Alert and oriented. No New focal neurological deficits, chronic neuromuscular deficits of both lower extremities with muscle wasting and very limited mobility Psych: Calm  Judgement and insight appear normal. Mood  & affect appropriate.    I have personally reviewed the following labs and images: CBC: Recent Labs  Lab 05/15/24 1530 05/16/24 0446 05/17/24 0426 05/18/24 0413 05/19/24 0419 05/20/24 0443  WBC 5.0 5.6 4.6 4.6 4.7 4.2  NEUTROABS 2.6  --   --   --   --   --   HGB 9.4* 9.3* 9.5* 9.8* 9.8* 9.1*  HCT 29.4* 29.6* 31.5* 31.7* 32.4* 28.5*  MCV 109.7* 110.0* 110.5* 110.5* 113.7* 110.5*  PLT 276 257 248 219 200 211   BMP &GFR Recent Labs  Lab 05/16/24 0446 05/17/24 0426 05/18/24 0413 05/19/24 0419 05/20/24 0443  NA 137 138 139 139 137  K 4.5 4.3 3.8 3.8 3.9  CL 103 104 105 110 108  CO2 28 27 24 22 25   GLUCOSE 101* 100* 92 86 102*  BUN 18 13 11 9  7*  CREATININE 0.47 0.56 0.50 0.44 0.45  CALCIUM  9.3 9.0 8.7* 8.1* 7.9*   Estimated Creatinine Clearance: 54.9 mL/min (by C-G formula based on SCr of 0.45 mg/dL). Liver & Pancreas: Recent Labs  Lab 05/15/24 1530 05/16/24 0446 05/17/24 0954 05/19/24 0419 05/20/24 0443  AST 69* 62* 68* 61* 60*  ALT 30 27  27 27 26   ALKPHOS 677* 661* 647* 574* 588*  BILITOT 1.7* 1.5* 1.4* 1.1 1.2  PROT 7.1 6.7 6.6 6.3* 6.0*  ALBUMIN  2.3* 2.1* 2.2* 2.1* 2.0*   Recent Labs  Lab 05/15/24 1530  LIPASE 73*   Urine analysis:    Component Value Date/Time   COLORURINE YELLOW 05/18/2024 1737   APPEARANCEUR CLEAR 05/18/2024 1737   LABSPEC 1.013 05/18/2024 1737   PHURINE 5.0 05/18/2024 1737   GLUCOSEU NEGATIVE 05/18/2024 1737   HGBUR NEGATIVE 05/18/2024 1737   BILIRUBINUR NEGATIVE 05/18/2024 1737   KETONESUR NEGATIVE 05/18/2024 1737   PROTEINUR NEGATIVE 05/18/2024 1737   NITRITE NEGATIVE 05/18/2024 1737   LEUKOCYTESUR TRACE (A) 05/18/2024 1737   Scheduled Meds:  amLODipine   5 mg Oral Daily   apixaban   2.5 mg Oral BID   DULoxetine   30 mg Oral BID   melatonin  6 mg Oral Once   metoprolol  succinate  75 mg Oral Daily   pantoprazole  (PROTONIX ) IV  40 mg Intravenous BID AC   polyethylene glycol  17 g Oral BID   sucralfate  1 g Oral TID AC &  HS   Continuous Infusions:   LOS: 2 days   Colin Dawley, MD [facility] [email or web address] 05/20/2024, 6:19 PM

## 2024-05-20 NOTE — Plan of Care (Signed)
   Problem: Activity: Goal: Risk for activity intolerance will decrease Outcome: Progressing   Problem: Coping: Goal: Level of anxiety will decrease Outcome: Progressing   Problem: Safety: Goal: Ability to remain free from injury will improve Outcome: Progressing

## 2024-05-20 NOTE — Telephone Encounter (Signed)
 Please arrange hospital follow up in 3-4 weeks with Rosea Conch, or Dr. Sammi Crick. I have never seen patient inpatient or outpatient.

## 2024-05-20 NOTE — Care Management Important Message (Signed)
 Important Message  Patient Details  Name: Stacey [name] MRN: [id number] Date of Birth: 1946/04/01   Important Message Given:  Yes - Medicare IM     Hadasa Gasner L Ascencion Coye 05/20/2024, 9:42 AM

## 2024-05-20 NOTE — TOC Initial Note (Signed)
 Transition of Care [facility]) - Initial/Assessment Note    Patient Details  Name: Stacey [name] MRN: [id number] Date of Birth: 04-Aug-1946  Transition of Care [facility]) CM/SW Contact:    Grandville Lax, LCSWA Phone Number: 05/20/2024, 9:17 AM  Clinical Narrative:                 Pt is high risk for readmission. CSW spoke with pts son to complete assessment. Pt lives with her son and he provides assistance with all ADLs. Pt has EMS transport when needed for appointments. Pt has HH with [facility] and will need new orders. TOC to follow.   Expected Discharge Plan: Home w Home Health Services Barriers to Discharge: Continued Medical Work up   Patient Goals and CMS Choice Patient states their goals for this hospitalization and ongoing recovery are:: home with [facility] CMS Medicare.gov Compare Post Acute Care list provided to:: Patient Represenative (must comment) Choice offered to / list presented to : Adult Children      Expected Discharge Plan and Services In-house Referral: Clinical Social Work Discharge Planning Services: CM Consult Post Acute Care Choice: Home Health Living arrangements for the past 2 months: Single Family Home                                      Prior Living Arrangements/Services Living arrangements for the past 2 months: Single Family Home Lives with:: Adult Children                   Activities of Daily Living   ADL Screening (condition at time of admission) Independently performs ADLs?: No Does the patient have a NEW difficulty with bathing/dressing/toileting/self-feeding that is expected to last >3 days?: No Does the patient have a NEW difficulty with getting in/out of bed, walking, or climbing stairs that is expected to last >3 days?: No Does the patient have a NEW difficulty with communication that is expected to last >3 days?: No Is the patient deaf or have difficulty hearing?: No Does the patient have difficulty seeing, even when wearing  glasses/contacts?: No Does the patient have difficulty concentrating, remembering, or making decisions?: No  Permission Sought/Granted                  Emotional Assessment              Admission diagnosis:  Epigastric pain [R10.13] Abdominal pain [R10.9] Patient Active Problem List   Diagnosis Date Noted   Peptic ulcer disease 05/19/2024   Left renal mass 05/15/2024   Liver abscess 04/22/2024   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   Liver lesion 04/20/2024   Biliary obstruction 04/14/2024   Chronic kidney disease, stage 3b (HCC) 04/14/2024   Atrial fibrillation, chronic (HCC) 04/14/2024   Thrombocytopenia (HCC) 04/14/2024   Hyperkalemia 03/11/2024   Acute on chronic anemia 03/10/2024   UTI (urinary tract infection) 03/09/2024   AKI (acute kidney injury) (HCC) 03/09/2024   Obstructive jaundice 03/09/2024   Pyuria 03/09/2024   Hyponatremia 03/09/2024   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   Ascending cholangitis 03/09/2024   Acute metabolic encephalopathy 03/08/2024   Bronchospasm 03/24/2022   Acute bronchospasm 03/23/2022   Elevated brain natriuretic peptide (BNP) level 03/23/2022   Macrocytic anemia 03/23/2022   Incarcerated umbilical hernia    Cholecystitis    Jaundice    Cholecystitis, acute with cholelithiasis 03/25/2021   Abdominal pain 03/25/2021   Leukocytosis 03/25/2021  Hypokalemia 03/25/2021   Hyperglycemia 03/25/2021   Chronic diastolic CHF (congestive heart failure) (HCC) 03/25/2021   Obesity, Class II, BMI 35-39.9 03/25/2021   Choledocholithiasis    Rectal bleeding 08/23/2020   Supratherapeutic INR 08/23/2020   Left hip pain    Pain of metastatic malignancy 06/27/2020   Malignant neoplasm of right breast in female, estrogen receptor positive (HCC)    Goals of care, counseling/discussion    Advanced care planning/counseling discussion    Spinal stenosis of lumbar region    Metastasis to bone [facility])    Palliative care by specialist    Lumbar pain  04/24/2020   Lumbar radiculopathy 02/16/2020   Palpitations 11/16/2019   Dysuria    Acute bilateral low back pain 10/02/2019   Bipolar 2 disorder, major depressive episode (HCC) 09/01/2019   Adjustment disorder with depressed mood 06/30/2019   Atrial fibrillation with rapid ventricular response (HCC) 11/14/2018   Fall    Obesity, Class III, BMI 40-49.9 (morbid obesity)    Chronic respiratory failure with hypoxia (HCC)    Transaminitis 10/07/2018   PSVT (paroxysmal supraventricular tachycardia) (HCC) 10/07/2018   HLD (hyperlipidemia) 10/07/2018   COPD (chronic obstructive pulmonary disease) (HCC) 10/07/2018   CAD in native artery 10/07/2018   Chronic pain 10/07/2018   Acute back pain 10/07/2018   Acute hypoxemic respiratory failure (HCC) 09/19/2018   Acute on chronic combined systolic and diastolic CHF (congestive heart failure) (HCC) 09/19/2018   COPD with acute exacerbation (HCC) 09/19/2018   Dyspnea 11/30/2017   Non-ST elevation (NSTEMI) myocardial infarction (HCC)    HTN (hypertension) 11/23/2017   Left bundle branch block 11/23/2017   Acute on chronic diastolic CHF (congestive heart failure) (HCC) 11/23/2017   CAD (coronary artery disease) 11/23/2017   Primary malignant neoplasm of breast with metastasis (HCC) 11/23/2017   Acute respiratory failure with hypoxia (HCC) 11/23/2017   Atypical nevus 07/29/2016   Age-related nuclear cataract, bilateral 02/12/2016   Anxiety and depression 01/24/2016   Arthropathy, lower leg 09/14/2013   Tibialis tendinitis 02/22/2013   Plantar fasciitis 01/17/2013   PCP:  [name], MD Pharmacy:   [facility] [address], [place] - [address] [place] [address] Phone: [phone] Fax: [phone]  [facility] - [place], [place] - [address] [place] [address] Phone: [phone] Fax: [phone]     Social Drivers of Health (SDOH) Social  History: SDOH Screenings   Food Insecurity: No Food Insecurity (05/16/2024)  Housing: Low Risk  (05/16/2024)  Transportation Needs: No Transportation Needs (05/16/2024)  Utilities: Not At Risk (05/16/2024)  Financial Resource Strain: High Risk (01/04/2024)   Received from [facility]  Physical Activity: Inactive (08/17/2023)   Received from [facility]  Social Connections: Unknown (05/16/2024)  Recent Concern: Social Connections - Socially Isolated (03/09/2024)  Stress: No Stress Concern Present (08/17/2023)   Received from [facility]  Tobacco Use: Medium Risk (05/16/2024)  Health Literacy: Inadequate Health Literacy (08/17/2023)   Received from [facility]   SDOH Interventions:     Readmission Risk Interventions    05/20/2024    9:15 AM 05/19/2024    2:01 PM 03/09/2024    8:15 AM  Readmission Risk Prevention Plan  Transportation Screening Complete Complete Complete  HRI or Home Care Consult   Complete  Social Work Consult for Recovery Care Planning/Counseling   Complete  Palliative Care Screening   Complete  Medication  Review [employer]) Complete Complete Complete  HRI or Home Care Consult Complete Complete   SW Recovery Care/Counseling Consult Complete Complete   Palliative Care Screening Not Applicable Not Applicable   Skilled Nursing Facility Not Applicable Not Applicable

## 2024-05-21 DIAGNOSIS — R1013 Epigastric pain: Secondary | ICD-10-CM | POA: Diagnosis not present

## 2024-05-21 LAB — CBC
HCT: 33.5 % — ABNORMAL LOW (ref 36.0–46.0)
Hemoglobin: 10.4 g/dL — ABNORMAL LOW (ref 12.0–15.0)
MCH: 34.9 pg — ABNORMAL HIGH (ref 26.0–34.0)
MCHC: 31 g/dL (ref 30.0–36.0)
MCV: 112.4 fL — ABNORMAL HIGH (ref 80.0–100.0)
Platelets: 216 10*3/uL (ref 150–400)
RBC: 2.98 MIL/uL — ABNORMAL LOW (ref 3.87–5.11)
RDW: 18 % — ABNORMAL HIGH (ref 11.5–15.5)
WBC: 4.9 10*3/uL (ref 4.0–10.5)
nRBC: 0 % (ref 0.0–0.2)

## 2024-05-21 NOTE — Discharge Instructions (Signed)
 Avoid constipation, follow high fiber diet. Take miralax   twice a day. If you develops diarrhea, hold miralax . Resume as needed.   Avoid acidic food for now.

## 2024-05-21 NOTE — Progress Notes (Signed)
 Nsg Discharge Note  Admit Date:  05/15/2024 Discharge date: 05/21/2024   Ajani  B [place] to be D/C'd Home per MD order.  AVS completed.  Copy for chart, and copy for patient signed, and dated. Patient/caregiver able to verbalize understanding.  Discharge Medication: Allergies as of 05/21/2024       Reactions   Albuterol  Other (See Comments)   Heart racing   Brilinta  [ticagrelor ] Diarrhea, Nausea Only   Nausea and severe diarrhea- general weakness. Patient says does not want to take again   Budesonide  Palpitations        Medication List     STOP taking these medications    amLODipine  5 MG tablet Commonly known as: NORVASC    amoxicillin -clavulanate 875-125 MG tablet Commonly known as: AUGMENTIN    losartan  100 MG tablet Commonly known as: COZAAR        TAKE these medications    acetaminophen  325 MG tablet Commonly known as: TYLENOL  Take 2 tablets (650 mg total) by mouth every 6 (six) hours as needed for mild pain (pain score 1-3) or fever (or Fever >/= 101).   ALPRAZolam  0.5 MG tablet Commonly known as: XANAX  Take 1 tablet (0.5 mg total) by mouth 2 (two) times daily as needed for anxiety or sleep.   apixaban  2.5 MG Tabs tablet Commonly known as: ELIQUIS  Take 2.5 mg by mouth daily.   b complex vitamins capsule Take 1 capsule by mouth daily.   carboxymethylcellulose 0.5 % Soln Commonly known as: REFRESH PLUS Place 1 drop into both eyes 3 (three) times daily as needed (Dry Eyes).   clopidogrel  75 MG tablet Commonly known as: PLAVIX  Take 75 mg by mouth daily.   DULoxetine  30 MG capsule Commonly known as: CYMBALTA  Take 30 mg by mouth 2 (two) times daily.   ferrous sulfate  325 (65 FE) MG EC tablet Take 325 mg by mouth daily with breakfast.   HYDROmorphone  2 MG tablet Commonly known as: DILAUDID  Take 4 mg by mouth every 4 (four) hours as needed for severe pain (pain score 7-10).   HYDROmorphone  1 MG/ML injection Commonly known as: DILAUDID  Inject 0.5  mLs (0.5 mg total) into the vein every 2 (two) hours as needed for severe pain (pain score 7-10) or moderate pain (pain score 4-6).   methocarbamol  500 MG tablet Commonly known as: ROBAXIN  Take 500 mg by mouth every 6 (six) hours as needed for muscle spasms.   metoprolol  succinate 25 MG 24 hr tablet Commonly known as: TOPROL -XL Take 3 tablets (75 mg total) by mouth daily.   nitroGLYCERIN  0.4 MG SL tablet Commonly known as: NITROSTAT  Place 0.4 mg under the tongue every 5 (five) minutes as needed for chest pain.   pantoprazole  40 MG tablet Commonly known as: Protonix  Take 1 tablet (40 mg total) by mouth 2 (two) times daily.   polyethylene glycol 17 g packet Commonly known as: MIRALAX  / GLYCOLAX  Take 17 g by mouth 2 (two) times daily.   senna-docusate 8.6-50 MG tablet Commonly known as: Senokot-S Take 2 tablets by mouth at bedtime.   sucralfate  1 GM/10ML suspension Commonly known as: CARAFATE  Take 10 mLs (1 g total) by mouth 4 (four) times daily -  before meals and at bedtime.       ASK your doctor about these medications    ondansetron  4 MG disintegrating tablet Commonly known as: ZOFRAN -ODT Take 4 mg by mouth every 8 (eight) hours as needed for vomiting or nausea. Ask about: Should I take this medication?  Discharge Care Instructions  (From admission, onward)           Start     Ordered   05/21/24 0000  Discharge wound care:       Comments: See above   05/21/24 0905            Discharge Assessment: Vitals:   05/20/24 2127 05/21/24 0521  BP: (!) 117/55 117/72  Pulse: 89 86  Resp: 20 12  Temp: 98.4 F (36.9 C) 97.9 F (36.6 C)  SpO2: 100% 97%   Skin clean, dry and intact without evidence of skin break down, no evidence of skin tears noted. IV catheter discontinued intact. Site without signs and symptoms of complications - no redness or edema noted at insertion site, patient denies c/o pain - only slight tenderness at site.   Dressing with slight pressure applied.  D/c Instructions-Education: Discharge instructions given to patient/family with verbalized understanding. D/c education completed with patient/family including follow up instructions, medication list, d/c activities limitations if indicated, with other d/c instructions as indicated by MD - patient able to verbalize understanding, all questions fully answered. Patient instructed to return to ED, call 911, or call MD for any changes in condition.  Patient escorted via WC, and D/C home via private auto.  Laymon JULIANNA Casey, RN 05/21/2024 10:44 AM

## 2024-05-21 NOTE — Discharge Summary (Signed)
 Physician Discharge Summary   Patient: Stacey [name] MRN: [id number] DOB: 1946/09/05  Admit date:     05/15/2024  Discharge date: 05/21/24  Discharge Physician: Owen DELENA Lore   PCP: Rosamond Leta KATHEE, MD   Recommendations at discharge:    Needs close follow up with GI, needs CBC and fu on Ulcer.  Needs to follow up with Primary oncologist.   Discharge Diagnoses: Principal Problem:   Abdominal pain Active Problems:   Atrial fibrillation, chronic (HCC)   CAD (coronary artery disease)   Chronic diastolic CHF (congestive heart failure) (HCC)   Primary malignant neoplasm of breast with metastasis (HCC)   Anxiety and depression   Chronic pain   Left renal mass   Peptic ulcer disease  Resolved Problems:   * No resolved hospital problems. *  Hospital Course: 78 y.o. female with a PMH significant for biliary stricture status post stenting, ascending cholangitis, breast cancer metastasized to bone (follows at [facility]), pathological L1 fracture, intrathecal pump (morphine /bupivacaine ), pulmonary embolism, atrial fibrillation, coronary artery disease with prior stenting, chronic kidney disease stage IIIb, left renal mass which is possibly cancer, COPD, anxiety and depression.  Brought to the emergency department with worsening epigastric/abdominal pain.  As well as bloating and infrequent vomiting.  Admitted for the same.  Gastroenterology following.  Working diagnosis is PUD/constipation.      Assessment and Plan: Abdominal pain - Repeated CT scan was negative for any acute issues or changes since prior CT done on admission - She is on PPI twice daily plus Carafate .  Appreciate GI input. - Earlier this week GI did mention possible EGD, but now holding unless overt bleeding or failure to improve. - Of note she does have compression fracture with severe spinal stenosis so this likely contributing to some degree of her pain especially the pain she is having in her back -- stable for  discharge, hb stable.  Abdominal pain could have been relate to constipation and or duodenal Ulcer.  Resolved. Discharge on laxative and PPI, Carafate    Elevated alk phos: - most likely elevated secondary to metastatic disease. - Does have CBD stent in place.  GI team not likely leak   Constipation: - Continue with bowel regimen especially in light of her narcotic use to control her pain. - Macrocytic anemia: - B12 levels elevated  - macrocytosis likely secondary to chemotherapy. -Hemoglobin stable here   Right lower extremity edema - h/o PE in 2024 - with chronic RLE edema -- dopplers negative here - was on Eliquis  prior to this admission, continued here   Atrial fibrillation, chronic Coronary artery disease s/p stenting Hypertension - Based on review of her medical records, Eliquis  and Plavix  were paused on 5/25 at hospital DC -- sounds like at least plavix /Eliquis  resumed per family.   - Heme note from 6/3 reports continuing eliquis  indefinitely.  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 (Active)  03/09/24 [id number]  Location: Sacrum  Location Orientation:   Staging: Stage 2 -  Partial thickness  loss of dermis presenting as a shallow open injury with a red, pink wound bed without slough.  Wound Description (Comments):   DO NOT USE:  Present on Admission: Yes  Dressing            Consultants: GI Disposition: Home Diet recommendation:  Discharge Diet Orders (From admission, onward)     Start     Ordered   05/21/24 0000  Diet - low sodium heart healthy        05/21/24 0905           Cardiac diet DISCHARGE MEDICATION: Allergies as of 05/21/2024       Reactions   Albuterol  Other (See Comments)   Heart racing   Brilinta  [ticagrelor ] Diarrhea, Nausea Only   Nausea and severe diarrhea- general weakness. Patient says does not want to take again   Budesonide  Palpitations        Medication List     STOP taking these medications    amLODipine  5 MG tablet Commonly known as: NORVASC    amoxicillin -clavulanate 875-125 MG tablet Commonly known as: AUGMENTIN    losartan  100 MG tablet Commonly known as: COZAAR        TAKE these medications    acetaminophen  325 MG tablet Commonly known as: TYLENOL  Take 2 tablets (650 mg total) by mouth every 6 (six) hours as needed for mild pain (pain score 1-3) or fever (or Fever >/= 101).   ALPRAZolam  0.5 MG tablet Commonly known as: XANAX  Take 1 tablet (0.5 mg total) by mouth 2 (two) times daily as needed for anxiety or sleep.   apixaban  2.5 MG Tabs tablet Commonly known as: ELIQUIS  Take 2.5 mg by mouth daily.   b complex vitamins capsule Take 1 capsule by mouth daily.   carboxymethylcellulose 0.5 % Soln Commonly known as: REFRESH PLUS Place 1 drop into both eyes 3 (three) times daily as needed (Dry Eyes).   clopidogrel  75 MG tablet Commonly known as: PLAVIX  Take 75 mg by mouth daily.   DULoxetine  30 MG capsule Commonly known as: CYMBALTA  Take 30 mg by mouth 2 (two) times daily.   ferrous sulfate  325 (65 FE) MG EC tablet Take 325 mg by mouth daily with breakfast.   HYDROmorphone  2 MG tablet Commonly known  as: DILAUDID  Take 4 mg by mouth every 4 (four) hours as needed for severe pain (pain score 7-10).   HYDROmorphone  1 MG/ML injection Commonly known as: DILAUDID  Inject 0.5 mLs (0.5 mg total) into the vein every 2 (two) hours as needed for severe pain (pain score 7-10) or moderate pain (pain score 4-6).   methocarbamol  500 MG tablet Commonly known as: ROBAXIN  Take 500 mg by mouth every 6 (six) hours as needed for muscle spasms.   metoprolol  succinate 25 MG 24 hr tablet Commonly known as: TOPROL -XL Take 3 tablets (75 mg total) by mouth daily.   nitroGLYCERIN  0.4 MG SL tablet Commonly known as: NITROSTAT  Place 0.4 mg under the tongue every 5 (five) minutes as needed for chest pain.   pantoprazole  40 MG tablet Commonly known as: Protonix  Take 1 tablet (40 mg total) by mouth 2 (two) times  daily.   polyethylene glycol 17 g packet Commonly known as: MIRALAX  / GLYCOLAX  Take 17 g by mouth 2 (two) times daily.   senna-docusate 8.6-50 MG tablet Commonly known as: Senokot-S Take 2 tablets by mouth at bedtime.   sucralfate  1 GM/10ML suspension Commonly known as: CARAFATE  Take 10 mLs (1 g total) by mouth 4 (four) times daily -  before meals and at bedtime.       ASK your doctor about these medications    ondansetron  4 MG disintegrating tablet Commonly known as: ZOFRAN -ODT Take 4 mg by mouth every 8 (eight) hours as needed for vomiting or nausea. Ask about: Should I take this medication?               Discharge Care Instructions  (From admission, onward)           Start     Ordered   05/21/24 0000  Discharge wound care:       Comments: See above   05/21/24 0905            Follow-up Information     Vyas, Dhruv B, MD Follow up in 1 week(s).   Specialty: Internal Medicine Contact information: [address] [place] [address] [phone]                Discharge Exam: Fredricka Weights   05/15/24 1530  Weight: 79.4 kg   General ;NAD  Condition at  discharge: stable  The results of significant diagnostics from this hospitalization (including imaging, microbiology, ancillary and laboratory) are listed below for reference.   Imaging Studies: CT ABDOMEN PELVIS WO CONTRAST Result Date: 05/18/2024 CLINICAL DATA:  Worsening abdominal pain. EXAM: CT ABDOMEN AND PELVIS WITHOUT CONTRAST TECHNIQUE: Multidetector CT imaging of the abdomen and pelvis was performed following the standard protocol without IV contrast. RADIATION DOSE REDUCTION: This exam was performed according to the departmental dose-optimization program which includes automated exposure control, adjustment of the mA and/or kV according to patient size and/or use of iterative reconstruction technique. COMPARISON:  05/15/2024. FINDINGS: Lower chest: No acute abnormality. Multivessel coronary artery calcifications. Hepatobiliary: Persistent unchanged pneumobilia with common bile duct stent in place. Stent appears patent. No focal liver abnormality is seen. Gallbladder is surgically absent. Pancreas: Similar prominence of the pancreatic duct measuring up to 4 mm. No surrounding inflammatory changes. Spleen: Unremarkable. Adrenals/Urinary Tract: Adrenal glands are unremarkable. Unchanged complex mass in the mid left kidney, better delineated and measuring up to 3.1 cm on the prior contrast-enhanced CT dated 05/15/2024. No urolithiasis or hydronephrosis. Bladder is unremarkable. Stomach/Bowel: Stomach is within normal limits. No evidence of obstruction. Enteric contrast is visualized to the level of the sigmoid colon. Moderate amount of retained stool is again noted within the colon and rectum. Sigmoid colonic diverticulosis without evidence of acute diverticulitis. Vascular/Lymphatic: Aortic atherosclerosis. Redemonstrated possible calcified dissection flap versus penetrating ulcer in the distal abdominal aorta. No enlarged abdominal or pelvic lymph nodes. Reproductive: Status post hysterectomy. No  adnexal masses. Other: No abdominopelvic ascites. No intraperitoneal free air. Implanted pump device in the anterior right abdomen. Musculoskeletal: Redemonstrated severe compression deformity of L1 with retropulsion resulting in severe spinal canal stenosis and levocurvature. Unchanged sclerosis of the superior L2 endplate. Multilevel degenerative changes of the thoracolumbar spine. IMPRESSION: 1. No acute localizing findings in the abdomen or pelvis. 2. Unchanged complex mass in the mid left kidney, compatible with known renal cell carcinoma, better delineated and measuring up to 3.1 cm on the prior contrast-enhanced CT dated 05/15/2024. 3.  Persistent unchanged pneumobilia with patent common bile duct stent in place. 4. Redemonstrated severe compression deformity of L1 with retropulsion resulting in severe spinal canal stenosis and levocurvature. 5. Additional unchanged chronic findings, as above. 6.  Aortic Atherosclerosis (ICD10-I70.0). Electronically Signed   By: Harrietta Sherry M.D.   On: 05/18/2024 17:03   US  Venous Img Lower Unilateral Right (DVT) Result Date: 05/16/2024 CLINICAL DATA:  Right leg pain and swelling EXAM: Right LOWER EXTREMITY VENOUS DOPPLER ULTRASOUND TECHNIQUE: Gray-scale sonography with compression, as well as color and duplex ultrasound, were performed to evaluate the deep venous system(s) from the level of the common femoral vein through the popliteal and proximal calf veins. COMPARISON:  None Available. FINDINGS: VENOUS Normal compressibility of the common femoral, superficial femoral, and popliteal veins, as well as the visualized calf veins. Visualized portions of profunda femoral vein and great saphenous vein unremarkable. No filling defects to suggest DVT on grayscale or color Doppler imaging. Doppler waveforms show normal direction of venous flow, normal respiratory plasticity and response to augmentation. Limited views of the contralateral common femoral vein are unremarkable.  OTHER None. Limitations: none IMPRESSION: No evidence of right lower extremity DVT. Electronically Signed   By: Ranell Bring M.D.   On: 05/16/2024 11:14   CT ABDOMEN PELVIS W CONTRAST Result Date: 05/15/2024 CLINICAL DATA:  Abdominal pain, acute, nonlocalized.  Fever. EXAM: CT ABDOMEN AND PELVIS WITH CONTRAST TECHNIQUE: Multidetector CT imaging of the abdomen and pelvis was performed using the standard protocol following bolus administration of intravenous contrast. RADIATION DOSE REDUCTION: This exam was performed according to the departmental dose-optimization program which includes automated exposure control, adjustment of the mA and/or kV according to patient size and/or use of iterative reconstruction technique. CONTRAST:  OMNIPAQUE  IOHEXOL  300 MG/ML  SOLN COMPARISON:  05/01/2024. FINDINGS: Lower chest: Multivessel coronary artery calcifications are noted. Atelectasis is noted at the lung bases. Hepatobiliary: No focal abnormality in the liver. There is pneumobilia with a common bile duct stent in place. The stent appears patent. The gallbladder is surgically absent. Pancreas: The pancreatic duct is prominent at 4 mm. No surrounding inflammatory changes are seen. Spleen: Normal in size without focal abnormality. Adrenals/Urinary Tract: The adrenal glands are within normal limits. The kidneys enhance symmetrically. There is a complex enhancing mass in the mid left kidney measuring up to 3.1 cm. A cyst is noted in the lower pole the left kidney. No renal calculus or hydronephrosis bilaterally. The bladder is unremarkable. Stomach/Bowel: The stomach is within normal limits. No bowel obstruction, free air, or pneumatosis. A moderate amount of retained stool is present in the colon. A few scattered diverticula are present along the colon without evidence of diverticulitis. Appendix is not seen. Vascular/Lymphatic: Aortic atherosclerosis. There is redemonstration of a possible calcified dissection flap  versus penetrating ulcer in the distal abdominal aorta. No enlarged abdominal or pelvic lymph nodes. Reproductive: Status post hysterectomy. No adnexal masses. Other: No abdominopelvic ascites. Implanted pump device is seen with in the anterior right abdomen Musculoskeletal: Degenerative changes are present in the thoracolumbar spine. There is severe spinal canal stenosis with associated compression deformity at the level L1 with retropulsed element, similar in appearance to the prior exam. Sclerosis of the superior endplate at L2 is unchanged. No new acute osseous abnormality is seen. IMPRESSION: 1. No acute intra-abdominal process. 2. Mild in overall large mint of complex enhancing mass in the left kidney, compatible with known renal cell carcinoma. 3. Pneumobilia with intrahepatic and extrahepatic biliary ductal dilatation and  patent common bile duct stent in place. 4. Moderate amount of retained stool in the colon. 5. Stable compression deformity at L1 with retropulsed elements resulting in severe spinal canal stenosis. 6. Coronary artery calcifications and aortic atherosclerosis. 7. Remaining chronic findings as described above. Electronically Signed   By: Leita Birmingham M.D.   On: 05/15/2024 16:49   DG Chest Port 1 View Result Date: 05/15/2024 CLINICAL DATA:  Shortness of breath.  Abdominal pain and vomiting. EXAM: PORTABLE CHEST 1 VIEW COMPARISON:  12/31/2023. FINDINGS: The heart size and mediastinal contours are within normal limits. There is atherosclerotic calcification of the aorta. No consolidation, effusion, or pneumothorax is seen. No acute osseous abnormality. Surgical clips are identified in the right upper quadrant. IMPRESSION: No active disease. Electronically Signed   By: Leita Birmingham M.D.   On: 05/15/2024 15:49   DG Shoulder Right Port Result Date: 05/03/2024 CLINICAL DATA:  Right shoulder pain and deformity. Right shoulder pain for several weeks. No injury. Patient is an inpatient receiving  cancer treatment. EXAM: RIGHT SHOULDER - 1 VIEW COMPARISON:  None Available. FINDINGS: No evidence of acute fracture or dislocation. Mild increase in subacromial space may indicate effusion. No focal bone lesion or bone destruction. Coracoclavicular and acromioclavicular spaces are normal. Soft tissues are unremarkable. IMPRESSION: No acute bony abnormalities. Mild increase of subacromial space may indicate effusion. Electronically Signed   By: Elsie Gravely M.D.   On: 05/03/2024 12:35   CT ABDOMEN PELVIS W CONTRAST Result Date: 05/01/2024 CLINICAL DATA:  Acute nonlocalized abdominal pain EXAM: CT ABDOMEN AND PELVIS WITH CONTRAST TECHNIQUE: Multidetector CT imaging of the abdomen and pelvis was performed using the standard protocol following bolus administration of intravenous contrast. RADIATION DOSE REDUCTION: This exam was performed according to the departmental dose-optimization program which includes automated exposure control, adjustment of the mA and/or kV according to patient size and/or use of iterative reconstruction technique. CONTRAST:  OMNIPAQUE  IOHEXOL  300 MG/ML  SOLN COMPARISON:  04/19/2024 FINDINGS: Lower chest: No acute abnormality. Extensive coronary artery calcification Hepatobiliary: Palliative metallic biliary stent again noted within the extrahepatic bile duct. Pneumobilia within the non dependent intrahepatic biliary tree suggest patency of the stented segment. Status post cholecystectomy. Previously noted hepatic hypodensities are not as well visualized on current examination suggesting that these may have reflected hepatic abscesses or intrahepatic bilomas and have resolved in the interval. No enhancing intrahepatic mass is identified. Pancreas: Unremarkable Spleen: Unremarkable Adrenals/Urinary Tract: The adrenal glands are unremarkable. The kidneys are normal in size and position. 2.7 cm heterogeneously enhancing solid mass within the pole the left kidney compatible with known  renal cell carcinoma is unchanged. Additional simple cortical cysts are seen within the kidneys bilaterally. The kidneys are otherwise unremarkable. Bladder is unremarkable. Stomach/Bowel: Moderate colonic stool burden. Stomach, small bowel, and large bowel are otherwise unremarkable. No evidence of obstruction or focal inflammation. Appendix is absent. No free intraperitoneal gas or fluid. Vascular/Lymphatic: Extensive aortoiliac atherosclerotic calcification. Stable penetrating atherosclerotic ulcer versus short segment focal dissection measuring 1.5 x 2.5 x 0.9 cm in greatest dimension. No aortic aneurysm. No pathologic adenopathy within the abdomen and pelvis. Reproductive: Status post hysterectomy. No adnexal masses. Other: Implanted pump device noted within the subcutaneous right lower quadrant anterior abdominal wall. No abdominal wall hernia. Musculoskeletal: Pathologic compression fracture of L1 is again seen with comminution of the vertebral body and retropulsion of the posterior body fracture fragment by up to 10 mm with resultant severe central canal stenosis with a residual AP diameter of  the spinal canal of approximately 3-4 mm (29/2) severe resultant bilateral neuroforaminal narrowing at T12-L1 and L1-L2. Superimposed degenerative changes noted throughout the thoracolumbar spine. IMPRESSION: 1. Palliative metallic biliary stent again noted within the extrahepatic bile duct. Pneumobilia within the non dependent intrahepatic biliary tree suggest patency of the stented segment. 2. Previously noted hepatic hypodensities are not as well visualized on current examination suggesting that these may have reflected hepatic abscesses or intrahepatic bilomas and have resolved in the interval. No enhancing intrahepatic mass is identified. 3. 2.7 cm heterogeneously enhancing solid mass within the pole the left kidney compatible with known renal cell carcinoma is unchanged. 4. Pathologic compression fracture of L1  is again seen with comminution of the vertebral body and retropulsion of the posterior body fracture fragment by up to 10 mm with resultant severe central canal stenosis with a residual AP diameter of the spinal canal of approximately 3-4 mm. Severe resultant bilateral neuroforaminal narrowing at T12-L1 and L1-L2. Surgical consultation may be helpful for further management. 5. Stable penetrating atherosclerotic ulcer versus short segment focal dissection measuring 1.5 x 2.5 x 0.9 cm in greatest dimension. 6. Moderate colonic stool burden. No evidence of obstruction or focal inflammation. Aortic Atherosclerosis (ICD10-I70.0). Electronically Signed   By: Dorethia Molt M.D.   On: 05/01/2024 19:30    Microbiology: Results for orders placed or performed during the hospital encounter of 04/14/24  Culture, blood (Routine X 2) w Reflex to ID Panel     Status: None   Collection Time: 04/14/24  7:12 PM   Specimen: BLOOD  Result Value Ref Range Status   Specimen Description BLOOD BLOOD LEFT HAND  Final   Special Requests   Final    BOTTLES DRAWN AEROBIC AND ANAEROBIC Blood Culture adequate volume   Culture   Final    NO GROWTH 5 DAYS Performed at [facility], [address], [place], [place] [address]    Report Status 04/19/2024 FINAL  Final  Culture, blood (Routine X 2) w Reflex to ID Panel     Status: None   Collection Time: 04/14/24  7:13 PM   Specimen: BLOOD  Result Value Ref Range Status   Specimen Description BLOOD BLOOD LEFT ARM  Final   Special Requests   Final    BOTTLES DRAWN AEROBIC AND ANAEROBIC Blood Culture adequate volume   Culture   Final    NO GROWTH 5 DAYS Performed at [facility], [address], [place], [place] [address]    Report Status 04/19/2024 FINAL  Final  MRSA Next Gen by PCR, Nasal     Status: Abnormal   Collection Time: 04/15/24  5:40 AM   Specimen: Nasal Mucosa; Nasal Swab  Result Value Ref Range Status   MRSA by PCR Next Gen DETECTED (A)  NOT DETECTED Final    Comment: RESULT CALLED TO, READ BACK BY AND VERIFIED WITH: Diallo, Boubacar P, RN 04/15/24 0719 MW (NOTE) The GeneXpert MRSA Assay (FDA approved for NASAL specimens only), is one component of a comprehensive MRSA colonization surveillance program. It is not intended to diagnose MRSA infection nor to guide or monitor treatment for MRSA infections. Test performance is not FDA approved in patients less than 33 years old. Performed at [facility], [address] [place], [place] [address]     Labs: CBC: Recent Labs  Lab 05/15/24 1530 05/16/24 0446 05/17/24 0426 05/18/24 0413 05/19/24 0419 05/20/24 0443 05/21/24 0320  WBC 5.0   < > 4.6 4.6 4.7 4.2  4.9  NEUTROABS 2.6  --   --   --   --   --   --   HGB 9.4*   < > 9.5* 9.8* 9.8* 9.1* 10.4*  HCT 29.4*   < > 31.5* 31.7* 32.4* 28.5* 33.5*  MCV 109.7*   < > 110.5* 110.5* 113.7* 110.5* 112.4*  PLT 276   < > 248 219 200 211 216   < > = values in this interval not displayed.   Basic Metabolic Panel: Recent Labs  Lab 05/16/24 0446 05/17/24 0426 05/18/24 0413 05/19/24 0419 05/20/24 0443  NA 137 138 139 139 137  K 4.5 4.3 3.8 3.8 3.9  CL 103 104 105 110 108  CO2 28 27 24 22 25   GLUCOSE 101* 100* 92 86 102*  BUN 18 13 11 9  7*  CREATININE 0.47 0.56 0.50 0.44 0.45  CALCIUM  9.3 9.0 8.7* 8.1* 7.9*   Liver Function Tests: Recent Labs  Lab 05/15/24 1530 05/16/24 0446 05/17/24 0954 05/19/24 0419 05/20/24 0443  AST 69* 62* 68* 61* 60*  ALT 30 27 27 27 26   ALKPHOS 677* 661* 647* 574* 588*  BILITOT 1.7* 1.5* 1.4* 1.1 1.2  PROT 7.1 6.7 6.6 6.3* 6.0*  ALBUMIN  2.3* 2.1* 2.2* 2.1* 2.0*   CBG: No results for input(s): GLUCAP in the last 168 hours.  Discharge time spent: greater than 30 minutes.  Signed: Owen DELENA Lore, MD [facility] 05/21/2024

## 2024-05-29 ENCOUNTER — Other Ambulatory Visit: Payer: Self-pay

## 2024-05-29 ENCOUNTER — Emergency Department (HOSPITAL_COMMUNITY)
Admission: EM | Admit: 2024-05-29 | Discharge: 2024-05-30 | Disposition: A | Discharge status: Home / Self Care | Attending: Emergency Medicine | Admitting: Emergency Medicine

## 2024-05-29 ENCOUNTER — Emergency Department (HOSPITAL_COMMUNITY)

## 2024-05-29 ENCOUNTER — Encounter (HOSPITAL_COMMUNITY): Payer: Self-pay | Admitting: Emergency Medicine

## 2024-05-29 DIAGNOSIS — Z7901 Long term (current) use of anticoagulants: Secondary | ICD-10-CM | POA: Insufficient documentation

## 2024-05-29 DIAGNOSIS — I2510 Atherosclerotic heart disease of native coronary artery without angina pectoris: Secondary | ICD-10-CM | POA: Diagnosis not present

## 2024-05-29 DIAGNOSIS — I509 Heart failure, unspecified: Secondary | ICD-10-CM | POA: Insufficient documentation

## 2024-05-29 DIAGNOSIS — R1084 Generalized abdominal pain: Secondary | ICD-10-CM | POA: Diagnosis present

## 2024-05-29 DIAGNOSIS — D500 Iron deficiency anemia secondary to blood loss (chronic): Secondary | ICD-10-CM | POA: Insufficient documentation

## 2024-05-29 DIAGNOSIS — Z79899 Other long term (current) drug therapy: Secondary | ICD-10-CM | POA: Insufficient documentation

## 2024-05-29 DIAGNOSIS — J449 Chronic obstructive pulmonary disease, unspecified: Secondary | ICD-10-CM | POA: Diagnosis not present

## 2024-05-29 DIAGNOSIS — I110 Hypertensive heart disease with heart failure: Secondary | ICD-10-CM | POA: Diagnosis not present

## 2024-05-29 DIAGNOSIS — Z853 Personal history of malignant neoplasm of breast: Secondary | ICD-10-CM | POA: Diagnosis not present

## 2024-05-29 LAB — CBC
HCT: 33.4 % — ABNORMAL LOW (ref 36.0–46.0)
Hemoglobin: 10.8 g/dL — ABNORMAL LOW (ref 12.0–15.0)
MCH: 35.2 pg — ABNORMAL HIGH (ref 26.0–34.0)
MCHC: 32.3 g/dL (ref 30.0–36.0)
MCV: 108.8 fL — ABNORMAL HIGH (ref 80.0–100.0)
Platelets: 226 10*3/uL (ref 150–400)
RBC: 3.07 MIL/uL — ABNORMAL LOW (ref 3.87–5.11)
RDW: 15.9 % — ABNORMAL HIGH (ref 11.5–15.5)
WBC: 5.4 10*3/uL (ref 4.0–10.5)
nRBC: 0 % (ref 0.0–0.2)

## 2024-05-29 LAB — COMPREHENSIVE METABOLIC PANEL WITH GFR
ALT: 21 U/L (ref 0–44)
AST: 58 U/L — ABNORMAL HIGH (ref 15–41)
Albumin: 2.3 g/dL — ABNORMAL LOW (ref 3.5–5.0)
Alkaline Phosphatase: 548 U/L — ABNORMAL HIGH (ref 38–126)
Anion gap: 9 (ref 5–15)
BUN: 12 mg/dL (ref 8–23)
CO2: 26 mmol/L (ref 22–32)
Calcium: 8.6 mg/dL — ABNORMAL LOW (ref 8.9–10.3)
Chloride: 103 mmol/L (ref 98–111)
Creatinine, Ser: 0.51 mg/dL (ref 0.44–1.00)
GFR, Estimated: >60 mL/min (ref >60)
Glucose, Bld: 117 mg/dL — ABNORMAL HIGH (ref 70–99)
Potassium: 4.7 mmol/L (ref 3.5–5.1)
Sodium: 138 mmol/L (ref 135–145)
Total Bilirubin: 1.1 mg/dL (ref 0.0–1.2)
Total Protein: 6.4 g/dL — ABNORMAL LOW (ref 6.5–8.1)

## 2024-05-29 LAB — TROPONIN I (HIGH SENSITIVITY): Troponin I (High Sensitivity): 12 ng/L (ref <18)

## 2024-05-29 LAB — LIPASE, BLOOD: Lipase: 92 U/L — ABNORMAL HIGH (ref 11–51)

## 2024-05-29 MED ORDER — FENTANYL CITRATE PF 50 MCG/ML IJ SOSY
50.0000 ug | PREFILLED_SYRINGE | Freq: Once | INTRAMUSCULAR | Status: AC
  Administered 2024-05-29: 50 ug via INTRAVENOUS
  Filled 2024-05-29: qty 1

## 2024-05-29 NOTE — ED Triage Notes (Signed)
 Pt BIB EMS from home with c/o abdominal pain since this AM.

## 2024-05-29 NOTE — ED Provider Notes (Signed)
 MSE was initiated and I personally evaluated the patient and placed orders (if any) at  10:38 PM on May 29, 2024.  The patient appears stable so that the remainder of the MSE may be completed by another provider.  She is brought in by ambulance from home complaining of generalized abdominal pain 10 out of 10 that started this morning.  No nausea or vomiting no fevers or chills.  No chest pain or shortness of breath.  She does have some burning with urination but she said it is more on her skin where she has some sores on her bottom.  She has not moved her bowels today but she did yesterday.  She said she has never had this pain before.   Towana Ozell BROCKS, MD 05/30/24 [phone]

## 2024-05-30 ENCOUNTER — Emergency Department (HOSPITAL_COMMUNITY)

## 2024-05-30 DIAGNOSIS — R1084 Generalized abdominal pain: Secondary | ICD-10-CM | POA: Diagnosis not present

## 2024-05-30 LAB — URINALYSIS, ROUTINE W REFLEX MICROSCOPIC
Bilirubin Urine: NEGATIVE
Glucose, UA: NEGATIVE mg/dL
Hgb urine dipstick: NEGATIVE
Ketones, ur: NEGATIVE mg/dL
Nitrite: NEGATIVE
Protein, ur: NEGATIVE mg/dL
Specific Gravity, Urine: 1.005 (ref 1.005–1.030)
pH: 6 (ref 5.0–8.0)

## 2024-05-30 MED ORDER — FENTANYL CITRATE (PF) 100 MCG/2ML IJ SOLN
100.0000 ug | Freq: Once | INTRAMUSCULAR | Status: AC
  Administered 2024-05-30: 100 ug via INTRAVENOUS
  Filled 2024-05-30: qty 2

## 2024-05-30 MED ORDER — ONDANSETRON HCL 4 MG/2ML IJ SOLN
4.0000 mg | Freq: Once | INTRAMUSCULAR | Status: AC
  Administered 2024-05-30: 4 mg via INTRAVENOUS
  Filled 2024-05-30: qty 2

## 2024-05-30 MED ORDER — FENTANYL CITRATE PF 50 MCG/ML IJ SOSY
50.0000 ug | PREFILLED_SYRINGE | Freq: Once | INTRAMUSCULAR | Status: AC
  Administered 2024-05-30: 50 ug via INTRAVENOUS
  Filled 2024-05-30: qty 1

## 2024-05-30 MED ORDER — IOHEXOL 300 MG/ML  SOLN
100.0000 mL | Freq: Once | INTRAMUSCULAR | Status: AC | PRN
  Administered 2024-05-30: 100 mL via INTRAVENOUS

## 2024-05-30 NOTE — ED Notes (Signed)
 Pt in bed with eyes closed, pt arouses easily to verbal stim, upon arousal pt reports 9/10 abd pain, pt then returns to pre aroused state

## 2024-05-30 NOTE — ED Provider Notes (Signed)
 [place] EMERGENCY DEPARTMENT AT [facility] Provider Note   CSN: [id number] Arrival date & time: 05/29/24  2226     Patient presents with: Abdominal Pain   Stacey [name] is a 78 y.o. female.   The history is provided by the patient.  Patient with extensive history including CAD, chronic back pain, COPD, atrial fibrillation on anticoagulation presents with abdominal pain.  Patient reports that she woke up with generalized abdominal pain that she has not had previously.  No fevers, no vomiting or diarrhea.  She reports she has had some constipations.  No chest pain or shortness of breath Patient reports she has a pain pump in place for her chronic back pain.  She reports she is mostly bedbound and does not walk   Other history includes left renal mass, peptic ulcer disease, CHF as well as biliary stricture with stenting and breast cancer with bone mets and pathologic lumbar fracture  Past Medical History:  Diagnosis Date   CAD in native artery    a. DES to ramus 2005 with late stent thrombosis 2006 tx with PTCA. 12/18 PCI/DES x1 to mRCA, EF 50-55%   CHF (congestive heart failure) (HCC)    Chronic pain    COPD (chronic obstructive pulmonary disease) (HCC)    Hyperlipidemia    Hypertension    Left bundle branch block    Lymphedema    Metastatic breast cancer    a. to bone.   MI (myocardial infarction) (HCC)    Mild aortic stenosis 10/2017   Morbid obesity (HCC)    PAF (paroxysmal atrial fibrillation) (HCC)    PSVT (paroxysmal supraventricular tachycardia) (HCC)    a. per [facility] notes, seen on event monitor in 2014.   Pulmonary nodules     Prior to Admission medications   Medication Sig Start Date End Date Taking? Authorizing Provider  acetaminophen  (TYLENOL ) 325 MG tablet Take 2 tablets (650 mg total) by mouth every 6 (six) hours as needed for mild pain (pain score 1-3) or fever (or Fever >/= 101). 05/20/24   Pearlean Manus, MD  ALPRAZolam  (XANAX ) 0.5 MG  tablet Take 1 tablet (0.5 mg total) by mouth 2 (two) times daily as needed for anxiety or sleep. 05/20/24   Pearlean Manus, MD  apixaban  (ELIQUIS ) 2.5 MG TABS tablet Take 2.5 mg by mouth daily.    [provider]  b complex vitamins capsule Take 1 capsule by mouth daily.    [provider]  carboxymethylcellulose (REFRESH PLUS) 0.5 % SOLN Place 1 drop into both eyes 3 (three) times daily as needed (Dry Eyes).    [provider]  clopidogrel  (PLAVIX ) 75 MG tablet Take 75 mg by mouth daily.    [provider]  DULoxetine  (CYMBALTA ) 30 MG capsule Take 30 mg by mouth 2 (two) times daily.    [provider]  ferrous sulfate  325 (65 FE) MG EC tablet Take 325 mg by mouth daily with breakfast.    [provider]  HYDROmorphone  (DILAUDID ) 1 MG/ML injection Inject 0.5 mLs (0.5 mg total) into the vein every 2 (two) hours as needed for severe pain (pain score 7-10) or moderate pain (pain score 4-6). 03/09/24   Drusilla Sabas RAMAN, MD  HYDROmorphone  (DILAUDID ) 2 MG tablet Take 4 mg by mouth every 4 (four) hours as needed for severe pain (pain score 7-10).    [provider]  methocarbamol  (ROBAXIN ) 500 MG tablet Take 500 mg by mouth every 6 (six) hours as needed  for muscle spasms.    [provider]  metoprolol  succinate (TOPROL -XL) 25 MG 24 hr tablet Take 3 tablets (75 mg total) by mouth daily. 03/19/24 05/15/24  Trudy Anthony HERO, MD  nitroGLYCERIN  (NITROSTAT ) 0.4 MG SL tablet Place 0.4 mg under the tongue every 5 (five) minutes as needed for chest pain.    [provider]  pantoprazole  (PROTONIX ) 40 MG tablet Take 1 tablet (40 mg total) by mouth 2 (two) times daily. 05/20/24 05/20/25  Pearlean Manus, MD  polyethylene glycol (MIRALAX  / GLYCOLAX ) 17 g packet Take 17 g by mouth 2 (two) times daily. 05/20/24   Pearlean Manus, MD  senna-docusate (SENOKOT-S) 8.6-50 MG tablet Take 2 tablets by mouth at bedtime. 05/20/24 05/20/25  Pearlean Manus,  MD  sucralfate  (CARAFATE ) 1 GM/10ML suspension Take 10 mLs (1 g total) by mouth 4 (four) times daily -  before meals and at bedtime. 05/20/24   Pearlean Manus, MD    Allergies: Albuterol , Brilinta  [ticagrelor ], and Budesonide     Review of Systems  Constitutional:  Negative for fever.  Gastrointestinal:  Positive for abdominal pain and constipation. Negative for vomiting.    Updated Vital Signs BP 99/77   Pulse 91   Temp 98 F (36.7 C)   Resp 16   Ht 1.524 m (5')   Wt 79.4 kg   SpO2 93%   BMI 34.19 kg/m   Physical Exam CONSTITUTIONAL: Ill-appearing HEAD: Normocephalic/atraumatic EYES: EOMI/PERRL ENMT: Mucous membranes moist, hirsutism NECK: supple no meningeal signs SPINE/BACK:entire spine nontender CV: S1/S2 noted, no murmurs/rubs/gallops noted LUNGS: Lungs are clear to auscultation bilaterally, no apparent distress ABDOMEN: soft, obese Upper abdominal tenderness is noted Bowel sounds are noted Pump is in place in upper abdomen GU:no cva tenderness NEURO: Pt is awake/alert/appropriate EXTREMITIES: pulses normal/equal, full ROM, no deformities SKIN: warm, color normal   (all labs ordered are listed, but only abnormal results are displayed) Labs Reviewed  LIPASE, BLOOD - Abnormal; Notable for the following components:      Result Value   Lipase 92 (*)    All other components within normal limits  COMPREHENSIVE METABOLIC PANEL WITH GFR - Abnormal; Notable for the following components:   Glucose, Bld 117 (*)    Calcium  8.6 (*)    Total Protein 6.4 (*)    Albumin  2.3 (*)    AST 58 (*)    Alkaline Phosphatase 548 (*)    All other components within normal limits  CBC - Abnormal; Notable for the following components:   RBC 3.07 (*)    Hemoglobin 10.8 (*)    HCT 33.4 (*)    MCV 108.8 (*)    MCH 35.2 (*)    RDW 15.9 (*)    All other components within normal limits  URINALYSIS, ROUTINE W REFLEX MICROSCOPIC - Abnormal; Notable for the following components:    APPearance HAZY (*)    Leukocytes,Ua TRACE (*)    Bacteria, UA RARE (*)    Crystals PRESENT (*)    All other components within normal limits  TROPONIN I (HIGH SENSITIVITY)    EKG: EKG Interpretation Date/Time:  Sunday May 29 2024 22:45:01 EDT Ventricular Rate:  87 PR Interval:  146 QRS Duration:  139 QT Interval:  397 QTC Calculation: 478 R Axis:   -45  Text Interpretation: Sinus rhythm Left bundle branch block Confirmed by Midge Golas ([id number]) on 05/29/2024 11:48:02 PM  Radiology: CT ABDOMEN PELVIS W CONTRAST Result Date: 05/30/2024 CLINICAL DATA:  Abdominal pain since this morning EXAM: CT  ABDOMEN AND PELVIS WITH CONTRAST TECHNIQUE: Multidetector CT imaging of the abdomen and pelvis was performed using the standard protocol following bolus administration of intravenous contrast. RADIATION DOSE REDUCTION: This exam was performed according to the departmental dose-optimization program which includes automated exposure control, adjustment of the mA and/or kV according to patient size and/or use of iterative reconstruction technique. CONTRAST:  OMNIPAQUE  IOHEXOL  300 MG/ML  SOLN COMPARISON:  Same day abdominal radiographs and CT 05/18/2024 FINDINGS: Lower chest: No acute abnormality. Hepatobiliary: Unchanged biliary stent and pneumobilia. Cholecystectomy. No focal hepatic lesion. Pancreas: Prominent pancreatic duct measuring up to 6 mm. This may have slightly increased compared to 05/18/2024. Trace stranding about the pancreatic head. Correlate with lipase for acute pancreatitis. Spleen: Unremarkable. Adrenals/Urinary Tract: Normal adrenal glands. Unchanged enhancing heterogenous neoplasm in the left kidney measuring 3.0 cm. No urinary calculi or hydronephrosis. Nondistended bladder. Stomach/Bowel: Normal caliber large and small bowel. Moderate colonic stool burden. Stomach is within normal limits. The appendix is not visualized. Vascular/Lymphatic: Advanced aortic atherosclerotic  calcification. No lymphadenopathy. Reproductive: Hysterectomy.  No adnexal mass. Other: Trace free fluid in the pelvis.  No free intraperitoneal air. Musculoskeletal: No acute fracture. Chronic compression fracture of L1 with retropulsion. IMPRESSION: 1. Increased dilation of the main pancreatic duct now measuring 6 mm. Trace stranding about the pancreatic head. Correlate with lipase for acute pancreatitis. 2. Unchanged biliary stent and pneumobilia. 3. Unchanged 3.0 cm left renal mass compatible with renal cell carcinoma. 4. Severe compression deformity of L1 with retropulsion resulting in severe spinal canal stenosis, unchanged. 5. Aortic Atherosclerosis (ICD10-I70.0). Electronically Signed   By: Norman Gatlin M.D.   On: 05/30/2024 02:45   DG Abdomen 1 View Result Date: 05/30/2024 CLINICAL DATA:  Abdominal pain onset this morning. EXAM: ABDOMEN - 1 VIEW COMPARISON:  CT with IV contrast 05/18/2024. FINDINGS: Nonobstructive bowel pattern with moderate fecal stasis. Right upper quadrant surgical clips and a common bile duct stent are again noted as well as an implanted pump device on the right. There is no supine evidence of free air or other acute radiographic findings. There is a severe chronic compression fracture of the L1 vertebral body, levoscoliosis and advanced degenerative change of the lumbar spine. Lung bases are clear. No new abnormality. IMPRESSION: 1. Nonobstructive bowel pattern with moderate fecal stasis. 2. No other acute radiographic findings. 3. Chronic findings as above. Electronically Signed   By: Francis Quam M.D.   On: 05/30/2024 01:10   DG Chest Port 1 View Result Date: 05/29/2024 CLINICAL DATA:  Upper abdominal pain EXAM: PORTABLE CHEST 1 VIEW COMPARISON:  Chest radiograph 05/15/2024 FINDINGS: Low lung volumes. Stable cardiomediastinal silhouette. Aortic atherosclerotic calcification. No focal consolidation, pleural effusion, or pneumothorax. No displaced rib fractures. IMPRESSION:  No active disease. Electronically Signed   By: Norman Gatlin M.D.   On: 05/29/2024 23:05     Procedures   Medications Ordered in the ED  fentaNYL  (SUBLIMAZE ) injection 50 mcg (50 mcg Intravenous Given 05/29/24 2245)  fentaNYL  (SUBLIMAZE ) injection 100 mcg (100 mcg Intravenous Given 05/30/24 0030)  ondansetron  (ZOFRAN ) injection 4 mg (4 mg Intravenous Given 05/30/24 0030)  iohexol  (OMNIPAQUE ) 300 MG/ML solution 100 mL (100 mLs Intravenous Contrast Given 05/30/24 0222)    Clinical Course as of 05/30/24 0336  Mon May 30, 2024  0052 Patient with extensive history presents for abdominal pain.  Patient has been admitted for abdominal pain previously has had up to 4 CT scans in the past month and a half Workup pending at this time [  DW]  0216 Patient reports continued abdominal pain and she does have diffuse significant abdominal tenderness.  Due to worsening pain, will obtain CT imaging [DW]  0334 Patient stable.  CT imaging does not show any acute changes though her symptoms may be related to her underlying biliary stent, the labs are near her baseline.  She is not septic appearing, she is not vomiting.  Low suspicion for cholangitis at this time   [DW]  0334 I had a long conversation with patient and she feels comfortable for discharge home.  She would benefit from follow-up with her GI specialist who placed a stent previously.  This was also discussed via the phone with her son [name] and he is agreeable with plan [DW]    Clinical Course User Index [DW] Midge Golas, MD                                 Medical Decision Making Amount and/or Complexity of Data Reviewed Labs: ordered. Radiology: ordered.  Risk Prescription drug management.   This patient presents to the ED for concern of abdominal pain, this involves an extensive number of treatment options, and is a complaint that carries with it a high risk of complications and morbidity.  The differential diagnosis includes but is  not limited to  pancreatitis, gastritis, peptic ulcer disease, appendicitis, bowel obstruction, bowel perforation, diverticulitis, AAA, ischemic bowel    Comorbidities that complicate the patient evaluation: Patient's presentation is complicated by their history of CAD and chronic pain  Social Determinants of Health: Patient's poor mobility  increases the complexity of managing their presentation  Additional history obtained: Records reviewed previous admission documents  Lab Tests: I Ordered, and personally interpreted labs.  The pertinent results include: Chronic liver dysfunction, chronic anemia  Imaging Studies ordered: I ordered imaging studies including X-ray chest and abdominal plain films  I independently visualized and interpreted imaging which showed no significant changes I agree with the radiologist interpretation  Cardiac Monitoring: The patient was maintained on a cardiac monitor.  I personally viewed and interpreted the cardiac monitor which showed an underlying rhythm of:  sinus rhythm  Medicines ordered and prescription drug management: I ordered medication including fentanyl  for pain Reevaluation of the patient after these medicines showed that the patient    improved  Reevaluation: After the interventions noted above, I reevaluated the patient and found that they have :improved  Complexity of problems addressed: Patient's presentation is most consistent with  acute presentation with potential threat to life or bodily function  Disposition: After consideration of the diagnostic results and the patient's response to treatment,  I feel that the patent would benefit from discharge  .        Final diagnoses:  Generalized abdominal pain    ED Discharge Orders     None          Midge Golas, MD 05/30/24 [phone]

## 2024-05-30 NOTE — ED Notes (Signed)
Dr Wickline at bedside. Harrie Cazarez Mamta Rimmer RN 

## 2024-05-30 NOTE — Discharge Instructions (Signed)
 Please continue your home pain medications.  Please call the doctor listed today for follow-up

## 2024-05-31 ENCOUNTER — Inpatient Hospital Stay

## 2024-05-31 ENCOUNTER — Inpatient Hospital Stay: Attending: Hematology

## 2024-05-31 DIAGNOSIS — Z1721 Progesterone receptor positive status: Secondary | ICD-10-CM | POA: Insufficient documentation

## 2024-05-31 DIAGNOSIS — C50411 Malignant neoplasm of upper-outer quadrant of right female breast: Secondary | ICD-10-CM | POA: Insufficient documentation

## 2024-05-31 DIAGNOSIS — Z5111 Encounter for antineoplastic chemotherapy: Secondary | ICD-10-CM | POA: Insufficient documentation

## 2024-05-31 DIAGNOSIS — C7951 Secondary malignant neoplasm of bone: Secondary | ICD-10-CM | POA: Insufficient documentation

## 2024-05-31 DIAGNOSIS — Z170 Estrogen receptor positive status [ER+]: Secondary | ICD-10-CM | POA: Insufficient documentation

## 2024-06-02 ENCOUNTER — Other Ambulatory Visit

## 2024-06-02 ENCOUNTER — Ambulatory Visit

## 2024-06-02 VITALS — BP 116/72 | HR 80 | Temp 96.6°F | Resp 18

## 2024-06-02 DIAGNOSIS — C50411 Malignant neoplasm of upper-outer quadrant of right female breast: Secondary | ICD-10-CM | POA: Diagnosis present

## 2024-06-02 DIAGNOSIS — Z5111 Encounter for antineoplastic chemotherapy: Secondary | ICD-10-CM | POA: Diagnosis present

## 2024-06-02 DIAGNOSIS — C50911 Malignant neoplasm of unspecified site of right female breast: Secondary | ICD-10-CM

## 2024-06-02 DIAGNOSIS — Z1721 Progesterone receptor positive status: Secondary | ICD-10-CM | POA: Diagnosis not present

## 2024-06-02 DIAGNOSIS — C7951 Secondary malignant neoplasm of bone: Secondary | ICD-10-CM | POA: Diagnosis present

## 2024-06-02 DIAGNOSIS — Z170 Estrogen receptor positive status [ER+]: Secondary | ICD-10-CM | POA: Diagnosis not present

## 2024-06-02 LAB — CBC WITH DIFFERENTIAL/PLATELET
Abs Immature Granulocytes: 0.02 10*3/uL (ref 0.00–0.07)
Basophils Absolute: 0 10*3/uL (ref 0.0–0.1)
Basophils Relative: 1 %
Eosinophils Absolute: 0.2 10*3/uL (ref 0.0–0.5)
Eosinophils Relative: 4 %
HCT: 31.2 % — ABNORMAL LOW (ref 36.0–46.0)
Hemoglobin: 9.8 g/dL — ABNORMAL LOW (ref 12.0–15.0)
Immature Granulocytes: 1 %
Lymphocytes Relative: 26 %
Lymphs Abs: 1.1 10*3/uL (ref 0.7–4.0)
MCH: 34.5 pg — ABNORMAL HIGH (ref 26.0–34.0)
MCHC: 31.4 g/dL (ref 30.0–36.0)
MCV: 109.9 fL — ABNORMAL HIGH (ref 80.0–100.0)
Monocytes Absolute: 0.3 10*3/uL (ref 0.1–1.0)
Monocytes Relative: 7 %
Neutro Abs: 2.6 10*3/uL (ref 1.7–7.7)
Neutrophils Relative %: 61 %
Platelets: 207 10*3/uL (ref 150–400)
RBC: 2.84 MIL/uL — ABNORMAL LOW (ref 3.87–5.11)
RDW: 15.5 % (ref 11.5–15.5)
WBC: 4.2 10*3/uL (ref 4.0–10.5)
nRBC: 0 % (ref 0.0–0.2)

## 2024-06-02 LAB — COMPREHENSIVE METABOLIC PANEL WITH GFR
ALT: 22 U/L (ref 0–44)
AST: 50 U/L — ABNORMAL HIGH (ref 15–41)
Albumin: 2.2 g/dL — ABNORMAL LOW (ref 3.5–5.0)
Alkaline Phosphatase: 453 U/L — ABNORMAL HIGH (ref 38–126)
Anion gap: 8 (ref 5–15)
BUN: 13 mg/dL (ref 8–23)
CO2: 26 mmol/L (ref 22–32)
Calcium: 8.1 mg/dL — ABNORMAL LOW (ref 8.9–10.3)
Chloride: 102 mmol/L (ref 98–111)
Creatinine, Ser: 0.56 mg/dL (ref 0.44–1.00)
GFR, Estimated: >60 mL/min (ref >60)
Glucose, Bld: 134 mg/dL — ABNORMAL HIGH (ref 70–99)
Potassium: 3.6 mmol/L (ref 3.5–5.1)
Sodium: 136 mmol/L (ref 135–145)
Total Bilirubin: 0.7 mg/dL (ref 0.0–1.2)
Total Protein: 6.1 g/dL — ABNORMAL LOW (ref 6.5–8.1)

## 2024-06-02 MED ORDER — FULVESTRANT 250 MG/5ML IM SOSY
500.0000 mg | PREFILLED_SYRINGE | Freq: Once | INTRAMUSCULAR | Status: AC
  Administered 2024-06-02: 500 mg via INTRAMUSCULAR
  Filled 2024-06-02: qty 10

## 2024-06-02 MED ORDER — DENOSUMAB 120 MG/1.7ML ~~LOC~~ SOLN
120.0000 mg | Freq: Once | SUBCUTANEOUS | Status: AC
Start: 2024-06-02 — End: 2024-06-02
  Administered 2024-06-02: 120 mg via SUBCUTANEOUS
  Filled 2024-06-02: qty 1.7

## 2024-06-02 NOTE — Patient Instructions (Signed)
 [facility] - A DEPT OF . [facility]  Discharge Instructions: Thank you for choosing [facility] to provide your oncology and hematology care.  If you have a lab appointment with the Cancer Center - please note that after April 8th, 2024, all labs will be drawn in the cancer center.  You do not have to check in or register with the main entrance as you have in the past but will complete your check-in in the cancer center.  Wear comfortable clothing and clothing appropriate for easy access to any Portacath or PICC line.   We strive to give you quality time with your provider. You may need to reschedule your appointment if you arrive late (15 or more minutes).  Arriving late affects you and other patients whose appointments are after yours.  Also, if you miss three or more appointments without notifying the office, you may be dismissed from the clinic at the provider's discretion.      For prescription refill requests, have your pharmacy contact our office and allow 72 hours for refills to be completed.    Today you received Faslodex  and Xgeva  120 mg      BELOW ARE SYMPTOMS THAT SHOULD BE REPORTED IMMEDIATELY: *FEVER GREATER THAN 100.4 F (38 C) OR HIGHER *CHILLS OR SWEATING *NAUSEA AND VOMITING THAT IS NOT CONTROLLED WITH YOUR NAUSEA MEDICATION *UNUSUAL SHORTNESS OF BREATH *UNUSUAL BRUISING OR BLEEDING *URINARY PROBLEMS (pain or burning when urinating, or frequent urination) *BOWEL PROBLEMS (unusual diarrhea, constipation, pain near the anus) TENDERNESS IN MOUTH AND THROAT WITH OR WITHOUT PRESENCE OF ULCERS (sore throat, sores in mouth, or a toothache) UNUSUAL RASH, SWELLING OR PAIN  UNUSUAL VAGINAL DISCHARGE OR ITCHING   Items with * indicate a potential emergency and should be followed up as soon as possible or go to the Emergency Department if any problems should occur.  Please show the CHEMOTHERAPY ALERT CARD or IMMUNOTHERAPY ALERT CARD at  check-in to the Emergency Department and triage nurse.  Should you have questions after your visit or need to cancel or reschedule your appointment, please contact [facility] - A DEPT OF [facility] [phone]  and follow the prompts.  Office hours are 8:00 a.m. to 4:30 p.m. Monday - Friday. Please note that voicemails left after 4:00 p.m. may not be returned until the following business day.  We are closed weekends and major holidays. You have access to a nurse at all times for urgent questions. Please call the main number to the clinic [phone] and follow the prompts.  For any non-urgent questions, you may also contact your provider using MyChart. We now offer e-Visits for anyone 22 and older to request care online for non-urgent symptoms. For details visit [email or web address].   Also download the MyChart app! Go to the app store, search MyChart, open the app, select [place], and log in with your MyChart username and password.

## 2024-06-02 NOTE — Progress Notes (Signed)
 Stacey  B [name] presents today for injection per the provider's orders.  Faslodex  and Xgeva   administration without incident; injection site WNL; see MAR for injection details.  Patient tolerated procedure well and without incident.  No questions or complaints noted at this time. Patient's Calcium  noted to be 8.1 today.   Patient denies any tooth, or jaw pain, and no recent or future major dental appointments  at this time. Patient reports taking Calcium /Vit D supplements as directed.  Discharged from clinic via stretcher by [facility] in stable condition. Alert and oriented x 3. F/U with [facility] as scheduled.

## 2024-06-03 LAB — CANCER ANTIGEN 15-3: CA 15-3: 24 U/mL (ref 0.0–25.0)

## 2024-06-03 LAB — CANCER ANTIGEN 27.29: CA 27.29: 39.7 U/mL — ABNORMAL HIGH (ref 0.0–38.6)

## 2024-06-06 ENCOUNTER — Other Ambulatory Visit: Payer: Self-pay

## 2024-06-06 ENCOUNTER — Emergency Department (HOSPITAL_COMMUNITY)

## 2024-06-06 ENCOUNTER — Inpatient Hospital Stay (HOSPITAL_COMMUNITY)
Admission: EM | Admit: 2024-06-06 | Discharge: 2024-06-09 | DRG: 438 | Disposition: A | Discharge status: Home Health | Attending: Internal Medicine | Admitting: Internal Medicine

## 2024-06-06 ENCOUNTER — Encounter (HOSPITAL_COMMUNITY): Payer: Self-pay | Admitting: Emergency Medicine

## 2024-06-06 DIAGNOSIS — Z9049 Acquired absence of other specified parts of digestive tract: Secondary | ICD-10-CM

## 2024-06-06 DIAGNOSIS — R54 Age-related physical debility: Secondary | ICD-10-CM | POA: Diagnosis present

## 2024-06-06 DIAGNOSIS — Z888 Allergy status to other drugs, medicaments and biological substances status: Secondary | ICD-10-CM

## 2024-06-06 DIAGNOSIS — Z87891 Personal history of nicotine dependence: Secondary | ICD-10-CM

## 2024-06-06 DIAGNOSIS — E66811 Obesity, class 1: Secondary | ICD-10-CM | POA: Diagnosis present

## 2024-06-06 DIAGNOSIS — Z6834 Body mass index (BMI) 34.0-34.9, adult: Secondary | ICD-10-CM

## 2024-06-06 DIAGNOSIS — Z825 Family history of asthma and other chronic lower respiratory diseases: Secondary | ICD-10-CM

## 2024-06-06 DIAGNOSIS — Z86711 Personal history of pulmonary embolism: Secondary | ICD-10-CM

## 2024-06-06 DIAGNOSIS — K921 Melena: Secondary | ICD-10-CM

## 2024-06-06 DIAGNOSIS — N2889 Other specified disorders of kidney and ureter: Secondary | ICD-10-CM | POA: Diagnosis present

## 2024-06-06 DIAGNOSIS — I130 Hypertensive heart and chronic kidney disease with heart failure and stage 1 through stage 4 chronic kidney disease, or unspecified chronic kidney disease: Secondary | ICD-10-CM | POA: Diagnosis present

## 2024-06-06 DIAGNOSIS — K2980 Duodenitis without bleeding: Secondary | ICD-10-CM

## 2024-06-06 DIAGNOSIS — Z955 Presence of coronary angioplasty implant and graft: Secondary | ICD-10-CM

## 2024-06-06 DIAGNOSIS — J449 Chronic obstructive pulmonary disease, unspecified: Secondary | ICD-10-CM | POA: Diagnosis present

## 2024-06-06 DIAGNOSIS — C50911 Malignant neoplasm of unspecified site of right female breast: Secondary | ICD-10-CM | POA: Diagnosis present

## 2024-06-06 DIAGNOSIS — I482 Chronic atrial fibrillation, unspecified: Secondary | ICD-10-CM | POA: Diagnosis present

## 2024-06-06 DIAGNOSIS — R160 Hepatomegaly, not elsewhere classified: Secondary | ICD-10-CM | POA: Diagnosis present

## 2024-06-06 DIAGNOSIS — K859 Acute pancreatitis without necrosis or infection, unspecified: Secondary | ICD-10-CM | POA: Diagnosis not present

## 2024-06-06 DIAGNOSIS — K831 Obstruction of bile duct: Secondary | ICD-10-CM | POA: Diagnosis present

## 2024-06-06 DIAGNOSIS — D630 Anemia in neoplastic disease: Secondary | ICD-10-CM | POA: Diagnosis present

## 2024-06-06 DIAGNOSIS — K2970 Gastritis, unspecified, without bleeding: Secondary | ICD-10-CM

## 2024-06-06 DIAGNOSIS — I480 Paroxysmal atrial fibrillation: Secondary | ICD-10-CM | POA: Diagnosis present

## 2024-06-06 DIAGNOSIS — I5032 Chronic diastolic (congestive) heart failure: Secondary | ICD-10-CM | POA: Diagnosis present

## 2024-06-06 DIAGNOSIS — I10 Essential (primary) hypertension: Secondary | ICD-10-CM | POA: Diagnosis present

## 2024-06-06 DIAGNOSIS — Z515 Encounter for palliative care: Secondary | ICD-10-CM

## 2024-06-06 DIAGNOSIS — G8929 Other chronic pain: Secondary | ICD-10-CM | POA: Diagnosis present

## 2024-06-06 DIAGNOSIS — Z5986 Financial insecurity: Secondary | ICD-10-CM

## 2024-06-06 DIAGNOSIS — F419 Anxiety disorder, unspecified: Secondary | ICD-10-CM | POA: Diagnosis present

## 2024-06-06 DIAGNOSIS — K2971 Gastritis, unspecified, with bleeding: Secondary | ICD-10-CM | POA: Diagnosis present

## 2024-06-06 DIAGNOSIS — Z66 Do not resuscitate: Secondary | ICD-10-CM | POA: Diagnosis present

## 2024-06-06 DIAGNOSIS — Z7902 Long term (current) use of antithrombotics/antiplatelets: Secondary | ICD-10-CM

## 2024-06-06 DIAGNOSIS — K769 Liver disease, unspecified: Secondary | ICD-10-CM | POA: Diagnosis present

## 2024-06-06 DIAGNOSIS — K5909 Other constipation: Secondary | ICD-10-CM | POA: Diagnosis present

## 2024-06-06 DIAGNOSIS — E785 Hyperlipidemia, unspecified: Secondary | ICD-10-CM | POA: Diagnosis present

## 2024-06-06 DIAGNOSIS — N1832 Chronic kidney disease, stage 3b: Secondary | ICD-10-CM | POA: Diagnosis present

## 2024-06-06 DIAGNOSIS — I252 Old myocardial infarction: Secondary | ICD-10-CM

## 2024-06-06 DIAGNOSIS — Z79818 Long term (current) use of other agents affecting estrogen receptors and estrogen levels: Secondary | ICD-10-CM

## 2024-06-06 DIAGNOSIS — Z8249 Family history of ischemic heart disease and other diseases of the circulatory system: Secondary | ICD-10-CM

## 2024-06-06 DIAGNOSIS — Z7901 Long term (current) use of anticoagulants: Secondary | ICD-10-CM

## 2024-06-06 DIAGNOSIS — F32A Depression, unspecified: Secondary | ICD-10-CM | POA: Diagnosis present

## 2024-06-06 DIAGNOSIS — Z79899 Other long term (current) drug therapy: Secondary | ICD-10-CM

## 2024-06-06 DIAGNOSIS — C7951 Secondary malignant neoplasm of bone: Secondary | ICD-10-CM | POA: Diagnosis present

## 2024-06-06 DIAGNOSIS — I2510 Atherosclerotic heart disease of native coronary artery without angina pectoris: Secondary | ICD-10-CM | POA: Diagnosis present

## 2024-06-06 DIAGNOSIS — K2091 Esophagitis, unspecified with bleeding: Secondary | ICD-10-CM | POA: Diagnosis present

## 2024-06-06 DIAGNOSIS — Z170 Estrogen receptor positive status [ER+]: Secondary | ICD-10-CM

## 2024-06-06 LAB — CBC
HCT: 32.1 % — ABNORMAL LOW (ref 36.0–46.0)
Hemoglobin: 10.3 g/dL — ABNORMAL LOW (ref 12.0–15.0)
MCH: 34.2 pg — ABNORMAL HIGH (ref 26.0–34.0)
MCHC: 32.1 g/dL (ref 30.0–36.0)
MCV: 106.6 fL — ABNORMAL HIGH (ref 80.0–100.0)
Platelets: 246 K/uL (ref 150–400)
RBC: 3.01 MIL/uL — ABNORMAL LOW (ref 3.87–5.11)
RDW: 14.6 % (ref 11.5–15.5)
WBC: 4.2 K/uL (ref 4.0–10.5)
nRBC: 0 % (ref 0.0–0.2)

## 2024-06-06 LAB — COMPREHENSIVE METABOLIC PANEL WITH GFR
ALT: 22 U/L (ref 0–44)
AST: 48 U/L — ABNORMAL HIGH (ref 15–41)
Albumin: 2.4 g/dL — ABNORMAL LOW (ref 3.5–5.0)
Alkaline Phosphatase: 402 U/L — ABNORMAL HIGH (ref 38–126)
Anion gap: 8 (ref 5–15)
BUN: 12 mg/dL (ref 8–23)
CO2: 28 mmol/L (ref 22–32)
Calcium: 8.6 mg/dL — ABNORMAL LOW (ref 8.9–10.3)
Chloride: 104 mmol/L (ref 98–111)
Creatinine, Ser: 0.51 mg/dL (ref 0.44–1.00)
GFR, Estimated: >60 mL/min (ref >60)
Glucose, Bld: 106 mg/dL — ABNORMAL HIGH (ref 70–99)
Potassium: 4.1 mmol/L (ref 3.5–5.1)
Sodium: 140 mmol/L (ref 135–145)
Total Bilirubin: 0.8 mg/dL (ref 0.0–1.2)
Total Protein: 6.3 g/dL — ABNORMAL LOW (ref 6.5–8.1)

## 2024-06-06 LAB — LACTIC ACID, PLASMA: Lactic Acid, Venous: 0.7 mmol/L (ref 0.5–1.9)

## 2024-06-06 LAB — LIPASE, BLOOD: Lipase: 69 U/L — ABNORMAL HIGH (ref 11–51)

## 2024-06-06 MED ORDER — HYDROMORPHONE HCL 1 MG/ML IJ SOLN
0.5000 mg | Freq: Once | INTRAMUSCULAR | Status: AC
  Administered 2024-06-06: 0.5 mg via INTRAVENOUS
  Filled 2024-06-06: qty 0.5

## 2024-06-06 MED ORDER — ONDANSETRON HCL 4 MG/2ML IJ SOLN
4.0000 mg | Freq: Once | INTRAMUSCULAR | Status: DC | PRN
  Filled 2024-06-06: qty 2

## 2024-06-06 MED ORDER — FAMOTIDINE IN NACL 20-0.9 MG/50ML-% IV SOLN
20.0000 mg | Freq: Once | INTRAVENOUS | Status: AC
  Administered 2024-06-06: 20 mg via INTRAVENOUS
  Filled 2024-06-06: qty 50

## 2024-06-06 MED ORDER — LIDOCAINE VISCOUS HCL 2 % MT SOLN
15.0000 mL | Freq: Once | OROMUCOSAL | Status: AC
  Administered 2024-06-06: 15 mL via ORAL
  Filled 2024-06-06: qty 15

## 2024-06-06 MED ORDER — IOHEXOL 300 MG/ML  SOLN
100.0000 mL | Freq: Once | INTRAMUSCULAR | Status: AC | PRN
  Administered 2024-06-07: 100 mL via INTRAVENOUS

## 2024-06-06 MED ORDER — ALUM & MAG HYDROXIDE-SIMETH 200-200-20 MG/5ML PO SUSP
30.0000 mL | Freq: Once | ORAL | Status: AC
  Administered 2024-06-06: 30 mL via ORAL
  Filled 2024-06-06: qty 30

## 2024-06-06 NOTE — ED Triage Notes (Signed)
 Pt bib [facility] for abd pain. Hx of gastric ulcers per ems. Pt has been dealing with them x1 month.

## 2024-06-06 NOTE — ED Provider Notes (Signed)
 [place] EMERGENCY DEPARTMENT AT [facility] ([facility]) Provider Note   CSN: [id number] Arrival date & time: 06/06/24  1710     Patient presents with: Abdominal Pain   Stacey [name] is a 78 y.o. female.  {Add pertinent medical, surgical, social history, OB history to YEP:67052}  Abdominal Pain Patient presents for abdominal pain.  Medical history includes HTN, HLD, COPD, CHF, CAD, metastatic breast cancer, atrial fibrillation, chronic pain.  She had admissions earlier this year for cholangitis.  She was last admitted 3 weeks ago for abdominal pain.  CT imaging at that time was negative.  She was treated for constipation and PUD.  No EGD was done at the time.  She was seen in the ED 1 week ago for abdominal pain.  She was treated with fentanyl  and Zofran .  Today, patient states that her pain has been ongoing.  Medications at home have not been helping.  She does feel like it worsens after eating and, because of this, she has not been eating.  Current pain is 10/10 in severity.  Location is described as central, from epigastrium to umbilicus.  Last bowel movement was yesterday.  She has not had any vomiting.     Prior to Admission medications   Medication Sig Start Date End Date Taking? Authorizing Provider  acetaminophen  (TYLENOL ) 325 MG tablet Take 2 tablets (650 mg total) by mouth every 6 (six) hours as needed for mild pain (pain score 1-3) or fever (or Fever >/= 101). 05/20/24   Pearlean Manus, MD  ALPRAZolam  (XANAX ) 0.5 MG tablet Take 1 tablet (0.5 mg total) by mouth 2 (two) times daily as needed for anxiety or sleep. 05/20/24   Pearlean Manus, MD  apixaban  (ELIQUIS ) 2.5 MG TABS tablet Take 2.5 mg by mouth daily.    [provider]  b complex vitamins capsule Take 1 capsule by mouth daily.    [provider]  carboxymethylcellulose (REFRESH PLUS) 0.5 % SOLN Place 1 drop into both eyes 3 (three) times daily as needed (Dry Eyes).    [provider]   clopidogrel  (PLAVIX ) 75 MG tablet Take 75 mg by mouth daily.    [provider]  DULoxetine  (CYMBALTA ) 30 MG capsule Take 30 mg by mouth 2 (two) times daily.    [provider]  ferrous sulfate  325 (65 FE) MG EC tablet Take 325 mg by mouth daily with breakfast.    [provider]  HYDROmorphone  (DILAUDID ) 1 MG/ML injection Inject 0.5 mLs (0.5 mg total) into the vein every 2 (two) hours as needed for severe pain (pain score 7-10) or moderate pain (pain score 4-6). 03/09/24   Drusilla Sabas RAMAN, MD  HYDROmorphone  (DILAUDID ) 2 MG tablet Take 4 mg by mouth every 4 (four) hours as needed for severe pain (pain score 7-10).    [provider]  methocarbamol  (ROBAXIN ) 500 MG tablet Take 500 mg by mouth every 6 (six) hours as needed for muscle spasms.    [provider]  metoprolol  succinate (TOPROL -XL) 25 MG 24 hr tablet Take 3 tablets (75 mg total) by mouth daily. 03/19/24 05/15/24  Trudy Anthony HERO, MD  nitroGLYCERIN  (NITROSTAT ) 0.4 MG SL tablet Place 0.4 mg under the tongue every 5 (five) minutes as needed for chest pain.    [provider]  pantoprazole  (PROTONIX ) 40 MG tablet Take 1 tablet (40 mg total) by mouth 2 (two) times daily. 05/20/24 05/20/25  Pearlean Manus, MD  polyethylene glycol (MIRALAX  / GLYCOLAX ) 17 g packet  Take 17 g by mouth 2 (two) times daily. 05/20/24   Pearlean Manus, MD  senna-docusate (SENOKOT-S) 8.6-50 MG tablet Take 2 tablets by mouth at bedtime. 05/20/24 05/20/25  Pearlean Manus, MD  sucralfate  (CARAFATE ) 1 GM/10ML suspension Take 10 mLs (1 g total) by mouth 4 (four) times daily -  before meals and at bedtime. 05/20/24   Pearlean Manus, MD    Allergies: Albuterol , Brilinta  [ticagrelor ], and Budesonide     Review of Systems  Constitutional:  Positive for appetite change.  Gastrointestinal:  Positive for abdominal pain.  All other systems reviewed and are negative.   Updated Vital Signs BP 137/60 (BP Location: Left Arm)    Pulse 85   Temp 98.4 F (36.9 C) (Oral)   Resp 16   Ht 5' (1.524 m)   Wt 79 kg   SpO2 99%   BMI 34.01 kg/m   Physical Exam Vitals and nursing note reviewed.  Constitutional:      General: She is not in acute distress.    Appearance: She is well-developed. She is not ill-appearing, toxic-appearing or diaphoretic.  HENT:     Head: Normocephalic and atraumatic.     Mouth/Throat:     Mouth: Mucous membranes are moist.  Eyes:     Conjunctiva/sclera: Conjunctivae normal.  Cardiovascular:     Rate and Rhythm: Normal rate and regular rhythm.  Pulmonary:     Effort: Pulmonary effort is normal. No respiratory distress.  Abdominal:     Palpations: Abdomen is soft.     Tenderness: There is generalized abdominal tenderness. There is no guarding or rebound.  Musculoskeletal:        General: No swelling.     Cervical back: Neck supple.  Skin:    General: Skin is warm and dry.     Coloration: Skin is not cyanotic or jaundiced.  Neurological:     General: No focal deficit present.     Mental Status: She is alert and oriented to person, place, and time.  Psychiatric:        Mood and Affect: Mood normal.        Behavior: Behavior normal.     (all labs ordered are listed, but only abnormal results are displayed) Labs Reviewed - No data to display  EKG: None  Radiology: No results found.  {Document cardiac monitor, telemetry assessment procedure when appropriate:32947} Procedures   Medications Ordered in the ED - No data to display    {Click here for ABCD2, HEART and other calculators REFRESH Note before signing:1}                              Medical Decision Making Amount and/or Complexity of Data Reviewed Labs: ordered.  Risk Prescription drug management.   This patient presents to the ED for concern of ***, this involves an extensive number of treatment options, and is a complaint that carries with it a high risk of complications and morbidity.  The differential  diagnosis includes ***   Co morbidities / Chronic conditions that complicate the patient evaluation  ***   Additional history obtained:  Additional history obtained from EMR External records from outside source obtained and reviewed including ***   Lab Tests:  I Ordered, and personally interpreted labs.  The pertinent results include:  ***   Imaging Studies ordered:  I ordered imaging studies including ***  I independently visualized and interpreted imaging which showed *** I agree with the  radiologist interpretation   Cardiac Monitoring: / EKG:  The patient was maintained on a cardiac monitor.  I personally viewed and interpreted the cardiac monitored which showed an underlying rhythm of: ***   Problem List / ED Course / Critical interventions / Medication management  *** I ordered medication including ***   Reevaluation of the patient after these medicines showed that the patient *** I have reviewed the patients home medicines and have made adjustments as needed   Consultations Obtained:  I requested consultation with the ***,  and discussed lab and imaging findings as well as pertinent plan - they recommend: ***   Social Determinants of Health:  ***   Test / Admission - Considered:  ***   {Document critical care time when appropriate  Document review of labs and clinical decision tools ie CHADS2VASC2, etc  Document your independent review of radiology images and any outside records  Document your discussion with family members, caretakers and with consultants  Document social determinants of health affecting pt's care  Document your decision making why or why not admission, treatments were needed:32947:::1}   Final diagnoses:  None    ED Discharge Orders     None

## 2024-06-07 DIAGNOSIS — K8689 Other specified diseases of pancreas: Secondary | ICD-10-CM | POA: Diagnosis not present

## 2024-06-07 DIAGNOSIS — K921 Melena: Secondary | ICD-10-CM | POA: Diagnosis not present

## 2024-06-07 DIAGNOSIS — T183XXA Foreign body in small intestine, initial encounter: Secondary | ICD-10-CM | POA: Diagnosis not present

## 2024-06-07 DIAGNOSIS — I480 Paroxysmal atrial fibrillation: Secondary | ICD-10-CM | POA: Diagnosis present

## 2024-06-07 DIAGNOSIS — K769 Liver disease, unspecified: Secondary | ICD-10-CM | POA: Diagnosis present

## 2024-06-07 DIAGNOSIS — I5032 Chronic diastolic (congestive) heart failure: Secondary | ICD-10-CM | POA: Diagnosis present

## 2024-06-07 DIAGNOSIS — D630 Anemia in neoplastic disease: Secondary | ICD-10-CM | POA: Diagnosis present

## 2024-06-07 DIAGNOSIS — R933 Abnormal findings on diagnostic imaging of other parts of digestive tract: Secondary | ICD-10-CM | POA: Diagnosis not present

## 2024-06-07 DIAGNOSIS — Z7189 Other specified counseling: Secondary | ICD-10-CM | POA: Diagnosis not present

## 2024-06-07 DIAGNOSIS — R1013 Epigastric pain: Secondary | ICD-10-CM | POA: Diagnosis not present

## 2024-06-07 DIAGNOSIS — K2990 Gastroduodenitis, unspecified, without bleeding: Secondary | ICD-10-CM | POA: Diagnosis not present

## 2024-06-07 DIAGNOSIS — F419 Anxiety disorder, unspecified: Secondary | ICD-10-CM | POA: Diagnosis present

## 2024-06-07 DIAGNOSIS — I130 Hypertensive heart and chronic kidney disease with heart failure and stage 1 through stage 4 chronic kidney disease, or unspecified chronic kidney disease: Secondary | ICD-10-CM | POA: Diagnosis present

## 2024-06-07 DIAGNOSIS — G8929 Other chronic pain: Secondary | ICD-10-CM | POA: Diagnosis present

## 2024-06-07 DIAGNOSIS — K859 Acute pancreatitis without necrosis or infection, unspecified: Secondary | ICD-10-CM | POA: Diagnosis present

## 2024-06-07 DIAGNOSIS — Z8711 Personal history of peptic ulcer disease: Secondary | ICD-10-CM

## 2024-06-07 DIAGNOSIS — K5909 Other constipation: Secondary | ICD-10-CM | POA: Diagnosis present

## 2024-06-07 DIAGNOSIS — K2289 Other specified disease of esophagus: Secondary | ICD-10-CM | POA: Diagnosis not present

## 2024-06-07 DIAGNOSIS — I110 Hypertensive heart disease with heart failure: Secondary | ICD-10-CM | POA: Diagnosis not present

## 2024-06-07 DIAGNOSIS — K2950 Unspecified chronic gastritis without bleeding: Secondary | ICD-10-CM | POA: Diagnosis not present

## 2024-06-07 DIAGNOSIS — N1832 Chronic kidney disease, stage 3b: Secondary | ICD-10-CM | POA: Diagnosis present

## 2024-06-07 DIAGNOSIS — C7951 Secondary malignant neoplasm of bone: Secondary | ICD-10-CM | POA: Diagnosis present

## 2024-06-07 DIAGNOSIS — E785 Hyperlipidemia, unspecified: Secondary | ICD-10-CM | POA: Diagnosis present

## 2024-06-07 DIAGNOSIS — I252 Old myocardial infarction: Secondary | ICD-10-CM | POA: Diagnosis not present

## 2024-06-07 DIAGNOSIS — Z7901 Long term (current) use of anticoagulants: Secondary | ICD-10-CM | POA: Diagnosis not present

## 2024-06-07 DIAGNOSIS — K2971 Gastritis, unspecified, with bleeding: Secondary | ICD-10-CM | POA: Diagnosis present

## 2024-06-07 DIAGNOSIS — K2091 Esophagitis, unspecified with bleeding: Secondary | ICD-10-CM | POA: Diagnosis present

## 2024-06-07 DIAGNOSIS — E66811 Obesity, class 1: Secondary | ICD-10-CM | POA: Diagnosis present

## 2024-06-07 DIAGNOSIS — I482 Chronic atrial fibrillation, unspecified: Secondary | ICD-10-CM | POA: Diagnosis present

## 2024-06-07 DIAGNOSIS — R1010 Upper abdominal pain, unspecified: Secondary | ICD-10-CM | POA: Diagnosis not present

## 2024-06-07 DIAGNOSIS — Z66 Do not resuscitate: Secondary | ICD-10-CM | POA: Diagnosis present

## 2024-06-07 DIAGNOSIS — C50911 Malignant neoplasm of unspecified site of right female breast: Secondary | ICD-10-CM | POA: Diagnosis present

## 2024-06-07 DIAGNOSIS — Z515 Encounter for palliative care: Secondary | ICD-10-CM | POA: Diagnosis not present

## 2024-06-07 DIAGNOSIS — I2510 Atherosclerotic heart disease of native coronary artery without angina pectoris: Secondary | ICD-10-CM | POA: Diagnosis present

## 2024-06-07 DIAGNOSIS — F32A Depression, unspecified: Secondary | ICD-10-CM | POA: Diagnosis present

## 2024-06-07 DIAGNOSIS — J449 Chronic obstructive pulmonary disease, unspecified: Secondary | ICD-10-CM | POA: Diagnosis present

## 2024-06-07 LAB — URINALYSIS, ROUTINE W REFLEX MICROSCOPIC
Bilirubin Urine: NEGATIVE
Glucose, UA: NEGATIVE mg/dL
Hgb urine dipstick: NEGATIVE
Ketones, ur: NEGATIVE mg/dL
Leukocytes,Ua: NEGATIVE
Nitrite: NEGATIVE
Protein, ur: NEGATIVE mg/dL
Specific Gravity, Urine: 1.009 (ref 1.005–1.030)
pH: 7 (ref 5.0–8.0)

## 2024-06-07 MED ORDER — BISACODYL 10 MG RE SUPP
10.0000 mg | Freq: Every day | RECTAL | Status: AC | PRN
  Administered 2024-06-07: 10 mg via RECTAL
  Filled 2024-06-07: qty 1

## 2024-06-07 MED ORDER — ACETAMINOPHEN 325 MG PO TABS: 650.0000 mg | ORAL_TABLET | Freq: Four times a day (QID) | ORAL | Status: DC | PRN

## 2024-06-07 MED ORDER — ONDANSETRON HCL 4 MG/2ML IJ SOLN
4.0000 mg | Freq: Once | INTRAMUSCULAR | Status: AC
  Administered 2024-06-07: 4 mg via INTRAVENOUS
  Filled 2024-06-07: qty 2

## 2024-06-07 MED ORDER — FERROUS SULFATE 325 (65 FE) MG PO TABS
325.0000 mg | ORAL_TABLET | Freq: Every day | ORAL | Status: DC
  Administered 2024-06-08 – 2024-06-09 (×2): 325 mg via ORAL
  Filled 2024-06-07 (×2): qty 1

## 2024-06-07 MED ORDER — ENOXAPARIN SODIUM 40 MG/0.4ML IJ SOSY
40.0000 mg | PREFILLED_SYRINGE | INTRAMUSCULAR | Status: DC
  Administered 2024-06-08: 40 mg via SUBCUTANEOUS
  Filled 2024-06-07: qty 0.4

## 2024-06-07 MED ORDER — DULOXETINE HCL 30 MG PO CPEP
30.0000 mg | ORAL_CAPSULE | Freq: Two times a day (BID) | ORAL | Status: DC
  Administered 2024-06-07 – 2024-06-09 (×5): 30 mg via ORAL
  Filled 2024-06-07 (×5): qty 1

## 2024-06-07 MED ORDER — MORPHINE SULFATE (PF) 2 MG/ML IV SOLN
2.0000 mg | INTRAVENOUS | Status: DC | PRN
  Administered 2024-06-07 – 2024-06-08 (×3): 2 mg via INTRAVENOUS
  Filled 2024-06-07 (×3): qty 1

## 2024-06-07 MED ORDER — CARBOXYMETHYLCELLULOSE SODIUM 0.5 % OP SOLN: 1.0000 [drp] | Freq: Three times a day (TID) | OPHTHALMIC | Status: DC | PRN

## 2024-06-07 MED ORDER — POLYETHYLENE GLYCOL 3350 17 G PO PACK
17.0000 g | PACK | Freq: Two times a day (BID) | ORAL | Status: DC
  Administered 2024-06-07 – 2024-06-09 (×4): 17 g via ORAL
  Filled 2024-06-07 (×4): qty 1

## 2024-06-07 MED ORDER — LACTATED RINGERS IV SOLN: INTRAVENOUS | Status: AC

## 2024-06-07 MED ORDER — APIXABAN 5 MG PO TABS
5.0000 mg | ORAL_TABLET | Freq: Two times a day (BID) | ORAL | Status: DC
  Administered 2024-06-07: 5 mg via ORAL
  Filled 2024-06-07: qty 1

## 2024-06-07 MED ORDER — ACETAMINOPHEN 650 MG RE SUPP: 650.0000 mg | Freq: Four times a day (QID) | RECTAL | Status: DC | PRN

## 2024-06-07 MED ORDER — LACTATED RINGERS IV BOLUS
1000.0000 mL | Freq: Once | INTRAVENOUS | Status: AC
  Administered 2024-06-07: 1000 mL via INTRAVENOUS

## 2024-06-07 MED ORDER — POLYVINYL ALCOHOL 1.4 % OP SOLN: 1.0000 [drp] | Freq: Three times a day (TID) | OPHTHALMIC | Status: DC | PRN

## 2024-06-07 MED ORDER — SENNOSIDES-DOCUSATE SODIUM 8.6-50 MG PO TABS
2.0000 | ORAL_TABLET | Freq: Every day | ORAL | Status: DC
  Administered 2024-06-07 – 2024-06-08 (×2): 2 via ORAL
  Filled 2024-06-07 (×2): qty 2

## 2024-06-07 MED ORDER — PANTOPRAZOLE SODIUM 40 MG PO TBEC
40.0000 mg | DELAYED_RELEASE_TABLET | Freq: Two times a day (BID) | ORAL | Status: DC
  Administered 2024-06-07 – 2024-06-09 (×4): 40 mg via ORAL
  Filled 2024-06-07 (×4): qty 1

## 2024-06-07 MED ORDER — ALPRAZOLAM 0.5 MG PO TABS
0.5000 mg | ORAL_TABLET | Freq: Two times a day (BID) | ORAL | Status: DC | PRN
  Administered 2024-06-07 – 2024-06-08 (×2): 0.5 mg via ORAL
  Filled 2024-06-07 (×2): qty 1

## 2024-06-07 MED ORDER — METOPROLOL SUCCINATE ER 25 MG PO TB24
75.0000 mg | ORAL_TABLET | Freq: Every day | ORAL | Status: DC
  Administered 2024-06-07 – 2024-06-09 (×3): 75 mg via ORAL
  Filled 2024-06-07 (×3): qty 1

## 2024-06-07 MED ORDER — METHOCARBAMOL 500 MG PO TABS: 500.0000 mg | ORAL_TABLET | Freq: Four times a day (QID) | ORAL | Status: DC | PRN

## 2024-06-07 NOTE — Plan of Care (Signed)
   Problem: Clinical Measurements: Goal: Ability to maintain clinical measurements within normal limits will improve Outcome: Progressing

## 2024-06-07 NOTE — Consult Note (Signed)
 @LOGO @   Referring Provider: [facility] Primary Care Physician:  Rosamond Leta NOVAK, MD Primary Gastroenterologist:  Dr. Eartha   Date of Admission: 06/06/24 Date of Consultation: 06/07/24  Reason for Consultation:  Pancreatitis   HPI:  Stacey [name] is a 78 y.o. year old female with history of metastatic breast cancer to bone on Faslodex  and Xgeva  and has intrathecal pump, left renal mass concerning for cancer, cholangitis April and May 2025 with plastic stent placement in April and stent exchange with metal stent placed in May at [facility], duodenal ulcers April 2024 noted at the time of first ERCP with associated acquired duodenal stenosis s/p dilation, CAD, CKD, COPD, anxiety, depression, PE, afib, on Eliquis  and Plavix , who presented to the ER on 7/7 for generalized abdominal pain.  ED course: Hemodynamically stable, afebrile. Labs remarkable for hemoglobin 10.3 (stable), alk phos 402 (improving), AST 48 (stable), ALT into the bilirubin within normal limits.  Lipase slightly elevated at 69.  CT A/P with contrast showed biliary stent in place, pneumobilia consistent with stent placement, Pancreatic ductal dilatation with prominence of pancreatic head and mild stranding around the pancreatic head that may indicate pancreatitis.  Wall thickening in the pyloric region of the stomach and in the duodenum suggesting gastroduodenitis and/or PUD.  Stool-filled colon.  Stable left kidney mass most consistent with renal cell carcinoma.   Today:  Reports persistent abdominal pain from the upper to mid abdomen at least since 05/30/24. Seems to be progressive over the last few days which is when she presented to the ER.  Pain is constant, but does wax and wane in severity at times with no specific trigger.  Does not seem to be worsened postprandially.  No nausea, vomiting, heartburn.   Chronic constipation continues with bowel movements every 2 to 3 days using MiraLAX  as needed.  No BRBPR or  melena.  Has been taking pantoprazole  40 mg BID.  Thinks she has been taking carafate .   No NSAIDs, ETOH.   No new medications since last discharge.   Son states she hasn't been taking Plavix . Eliquis  only with last dose morning of 7/7.   Patient states she is very hungry and would like to eat.    Past Medical History:  Diagnosis Date   CAD in native artery    a. DES to ramus 2005 with late stent thrombosis 2006 tx with PTCA. 12/18 PCI/DES x1 to mRCA, EF 50-55%   CHF (congestive heart failure) (HCC)    Chronic pain    COPD (chronic obstructive pulmonary disease) (HCC)    Hyperlipidemia    Hypertension    Left bundle branch block    Lymphedema    Metastatic breast cancer    a. to bone.   MI (myocardial infarction) (HCC)    Mild aortic stenosis 10/2017   Morbid obesity (HCC)    PAF (paroxysmal atrial fibrillation) (HCC)    PSVT (paroxysmal supraventricular tachycardia) (HCC)    a. per [facility] notes, seen on event monitor in 2014.   Pulmonary nodules     Past Surgical History:  Procedure Laterality Date   BILIARY STENT PLACEMENT  03/09/2024   Procedure: INSERTION, STENT, BILE DUCT;  Surgeon: Jinny Carmine, MD;  Location: [facility] ENDOSCOPY;  Service: Endoscopy;;   CHOLECYSTECTOMY N/A 03/29/2021   Procedure: LAPAROSCOPIC CHOLECYSTECTOMY;  Surgeon: Kallie Manuelita BROCKS, MD;  Location: [facility];  Service: General;  Laterality: N/A;   CORONARY STENT INTERVENTION N/A 11/26/2017   Procedure: CORONARY STENT INTERVENTION;  Surgeon: Swaziland, Peter M,  MD;  Location: [facility] IN[facility] CV LAB;  Service: Cardiovascular;  Laterality: N/A;   CORONARY STENT PLACEMENT     ERCP N/A 03/28/2021   Procedure: ENDOSCOPIC RETROGRADE CHOLANGIOPANCREATOGRAPHY (ERCP);  Surgeon: Golda Claudis PENNER, MD;  Location: [facility];  Service: Endoscopy;  Laterality: N/A;   ERCP N/A 03/09/2024   Procedure: ERCP, WITH INTERVENTION IF INDICATED;  Surgeon: Jinny Carmine, MD;  Location: [facility] ENDOSCOPY;  Service: Endoscopy;  Laterality: N/A;    ERCP N/A 04/15/2024   Procedure: ERCP, WITH INTERVENTION IF INDICATED;  Surgeon: Jinny Carmine, MD;  Location: [facility] ENDOSCOPY;  Service: Endoscopy;  Laterality: N/A;   ESOPHAGOGASTRODUODENOSCOPY  03/09/2024   Procedure: EGD (ESOPHAGOGASTRODUODENOSCOPY);  Surgeon: Jinny Carmine, MD;  Location: [facility] ENDOSCOPY;  Service: Endoscopy;;   intrathecal pain pump     LEFT HEART CATH AND CORONARY ANGIOGRAPHY N/A 11/26/2017   Procedure: LEFT HEART CATH AND CORONARY ANGIOGRAPHY;  Surgeon: Swaziland, Peter M, MD;  Location: [facility] INVASIVE CV LAB;  Service: Cardiovascular;  Laterality: N/A;   REMOVAL OF STONES  03/28/2021   Procedure: REMOVAL OF STONES;  Surgeon: Golda Claudis PENNER, MD;  Location: [facility];  Service: Endoscopy;;   SPHINCTEROTOMY  03/28/2021   Procedure: SPHINCTEROTOMY;  Surgeon: Golda Claudis PENNER, MD;  Location: [facility];  Service: Endoscopy;;   UMBILICAL HERNIA REPAIR N/A 04/03/2021   Procedure: ADULT PRIMARY UMBILICAL HERNIA REPAIR;  Surgeon: Kallie Manuelita BROCKS, MD;  Location: [facility];  Service: General;  Laterality: N/A;   VASCULAR SURGERY      Prior to Admission medications   Medication Sig Start Date End Date Taking? Authorizing Provider  acetaminophen  (TYLENOL ) 325 MG tablet Take 2 tablets (650 mg total) by mouth every 6 (six) hours as needed for mild pain (pain score 1-3) or fever (or Fever >/= 101). 05/20/24   Pearlean Manus, MD  ALPRAZolam  (XANAX ) 0.5 MG tablet Take 1 tablet (0.5 mg total) by mouth 2 (two) times daily as needed for anxiety or sleep. 05/20/24   Pearlean Manus, MD  apixaban  (ELIQUIS ) 2.5 MG TABS tablet Take 2.5 mg by mouth daily.    [provider]  b complex vitamins capsule Take 1 capsule by mouth daily.    [provider]  carboxymethylcellulose (REFRESH PLUS) 0.5 % SOLN Place 1 drop into both eyes 3 (three) times daily as needed (Dry Eyes).    [provider]  clopidogrel  (PLAVIX ) 75 MG tablet Take 75 mg by mouth daily.    [provider]   DULoxetine  (CYMBALTA ) 30 MG capsule Take 30 mg by mouth 2 (two) times daily.    [provider]  ferrous sulfate  325 (65 FE) MG EC tablet Take 325 mg by mouth daily with breakfast.    [provider]  HYDROmorphone  (DILAUDID ) 1 MG/ML injection Inject 0.5 mLs (0.5 mg total) into the vein every 2 (two) hours as needed for severe pain (pain score 7-10) or moderate pain (pain score 4-6). 03/09/24   Drusilla Sabas RAMAN, MD  HYDROmorphone  (DILAUDID ) 2 MG tablet Take 4 mg by mouth every 4 (four) hours as needed for severe pain (pain score 7-10).    [provider]  methocarbamol  (ROBAXIN ) 500 MG tablet Take 500 mg by mouth every 6 (six) hours as needed for muscle spasms.    [provider]  metoprolol  succinate (TOPROL -XL) 25 MG 24 hr tablet Take 3 tablets (75 mg total) by mouth daily. 03/19/24 05/15/24  Trudy Anthony HERO, MD  nitroGLYCERIN  (NITROSTAT ) 0.4 MG SL tablet Place 0.4 mg under the  tongue every 5 (five) minutes as needed for chest pain.    [provider]  pantoprazole  (PROTONIX ) 40 MG tablet Take 1 tablet (40 mg total) by mouth 2 (two) times daily. 05/20/24 05/20/25  Pearlean Manus, MD  polyethylene glycol (MIRALAX  / GLYCOLAX ) 17 g packet Take 17 g by mouth 2 (two) times daily. 05/20/24   Pearlean Manus, MD  senna-docusate (SENOKOT-S) 8.6-50 MG tablet Take 2 tablets by mouth at bedtime. 05/20/24 05/20/25  Pearlean Manus, MD  sucralfate  (CARAFATE ) 1 GM/10ML suspension Take 10 mLs (1 g total) by mouth 4 (four) times daily -  before meals and at bedtime. 05/20/24   Pearlean Manus, MD    Current Facility-Administered Medications  Medication Dose Route Frequency Provider Last Rate Last Admin   acetaminophen  (TYLENOL ) tablet 650 mg  650 mg Oral Q6H PRN Maree, Pratik D, DO       Or   acetaminophen  (TYLENOL ) suppository 650 mg  650 mg Rectal Q6H PRN Maree, Pratik D, DO       ALPRAZolam  (XANAX ) tablet 0.5 mg  0.5 mg Oral BID PRN Maree, Pratik D, DO       apixaban   (ELIQUIS ) tablet 2.5 mg  2.5 mg Oral Daily Shah, Pratik D, DO       carboxymethylcellulose (REFRESH PLUS) 0.5 % ophthalmic solution 1 drop  1 drop Both Eyes TID PRN Maree, Pratik D, DO       DULoxetine  (CYMBALTA ) DR capsule 30 mg  30 mg Oral BID Maree, Pratik D, DO       [START ON 06/08/2024] ferrous sulfate  EC tablet 325 mg  325 mg Oral Q breakfast Maree, Pratik D, DO       lactated ringers  infusion   Intravenous Continuous Maree, Pratik D, DO       methocarbamol  (ROBAXIN ) tablet 500 mg  500 mg Oral Q6H PRN Maree, Pratik D, DO       metoprolol  succinate (TOPROL -XL) 24 hr tablet 75 mg  75 mg Oral Daily Shah, Pratik D, DO       morphine  (PF) 2 MG/ML injection 2 mg  2 mg Intravenous Q4H PRN Maree, Pratik D, DO   2 mg at 06/07/24 0608   ondansetron  (ZOFRAN ) injection 4 mg  4 mg Intravenous Once PRN Maree, Pratik D, DO       pantoprazole  (PROTONIX ) EC tablet 40 mg  40 mg Oral BID Maree, Pratik D, DO       polyethylene glycol (MIRALAX  / GLYCOLAX ) packet 17 g  17 g Oral BID Maree, Pratik D, DO       senna-docusate (Senokot-S) tablet 2 tablet  2 tablet Oral QHS Shah, Pratik D, DO        Allergies as of 06/06/2024 - Reviewed 06/06/2024  Allergen Reaction Noted   Albuterol  Other (See Comments) 11/30/2018   Brilinta  [ticagrelor ] Diarrhea and Nausea Only 04/19/2018   Budesonide  Palpitations 09/14/2020    Family History  Problem Relation Age of Onset   CAD Father 72   Heart attack Father    COPD Sister    CAD Paternal Grandmother    Sudden Cardiac Death Neg Hx    Colon polyps Neg Hx    Colon cancer Neg Hx     Social History   Socioeconomic History   Marital status: Single    Spouse name: Not on file   Number of children: Not on file   Years of education: Not on file   Highest education level: Not on file  Occupational History  Occupation: Retired  Tobacco Use   Smoking status: Former    Current packs/day: 0.00    Average packs/day: 2.0 packs/day for 18.0 years (36.0 ttl pk-yrs)    Types:  Cigarettes    Start date: 12/01/1962    Quit date: 12/01/1980    Years since quitting: 43.5   Smokeless tobacco: Never  Vaping Use   Vaping status: Never Used  Substance and Sexual Activity   Alcohol  use: No   Drug use: Never   Sexual activity: Not Currently  Other Topics Concern   Not on file  Social History Narrative   Not on file   Social Drivers of Health   Financial Resource Strain: High Risk (01/04/2024)   Received from [facility]   Overall Financial Resource Strain (CARDIA)    Difficulty of Paying Living Expenses: Hard  Food Insecurity: No Food Insecurity (06/07/2024)   Hunger Vital Sign    Worried About Running Out of Food in the Last Year: Never true    Ran Out of Food in the Last Year: Never true  Transportation Needs: No Transportation Needs (06/07/2024)   PRAPARE - Administrator, Civil Service (Medical): No    Lack of Transportation (Non-Medical): No  Physical Activity: Inactive (08/17/2023)   Received from [facility]   Exercise Vital Sign    On average, how many days per week do you engage in moderate to strenuous exercise (like a brisk walk)?: 0 days    On average, how many minutes do you engage in exercise at this level?: 0 min  Stress: No Stress Concern Present (08/17/2023)   Received from [facility] Occupational [facility] - Occupational Stress Questionnaire    Feeling of Stress : Only a little  Social Connections: Unknown (06/07/2024)   Social Connection and Isolation Panel    Frequency of Communication with Friends and Family: More than three times a week    Frequency of Social Gatherings with Friends and Family: More than three times a week    Attends Religious Services: More than 4 times per year    Active Member of [employer] or Organizations: No    Attends Engineer, structural: More than 4 times per year    Marital Status: Patient declined  Recent Concern: Social  Connections - Socially Isolated (03/09/2024)   Social Connection and Isolation Panel    Frequency of Communication with Friends and Family: More than three times a week    Frequency of Social Gatherings with Friends and Family: More than three times a week    Attends Religious Services: Never    Database administrator or Organizations: No    Attends Banker Meetings: Never    Marital Status: Widowed  Intimate Partner Violence: Not At Risk (06/07/2024)   Humiliation, Afraid, Rape, and Kick questionnaire    Fear of Current or Ex-Partner: No    Emotionally Abused: No    Physically Abused: No    Sexually Abused: No    Review of Systems: Gen: Denies fever, chills, cold or flulike symptoms, presyncope, syncope. CV: Denies chest pain, heart palpitations. Resp: Denies shortness of breath, cough. GI: See HPI GU : Denies urinary burning, urinary frequency, urinary incontinence.  Heme: See HPI  Physical Exam: Vital signs in last 24 hours: Temp:  [97.8 F (36.6 C)-98.4 F (36.9 C)] 98.2 F (36.8 C) (07/08 0900) Pulse Rate:  [56-105] 105 (07/08 0900) Resp:  [16-18]  18 (07/08 0900) BP: (130-162)/(57-87) 162/87 (07/08 0900) SpO2:  [97 %-99 %] 99 % (07/08 0900) Weight:  [79 kg] 79 kg (07/07 1728) Last BM Date : 06/06/24 General:   Alert,  Well-developed, well-nourished, pleasant and cooperative in NAD Head:  Normocephalic and atraumatic. Eyes:  Sclera clear, no icterus.   Conjunctiva pink. Ears:  Normal auditory acuity. Lungs:  Clear throughout to auscultation.   No wheezes, crackles, or rhonchi. No acute distress. Heart:  Regular rate and rhythm; no murmurs, clicks, rubs,  or gallops. Abdomen:  Soft, and nondistended. Mid to moderate TTP across upper abdomen and periumbilically, but most prominent in epigastric area. No masses, hepatosplenomegaly or hernias noted. Normal bowel sounds, without guarding, and without rebound.   Rectal:  Deferred Msk:  Symmetrical without gross  deformities. Normal posture. Extremities:  With 1-2 + RLE edema which patient reports is chronic.  Neurologic:  Alert and  oriented x4;  grossly normal neurologically. Skin:  Intact without significant lesions or rashes. Psych:  Normal mood and affect.  Intake/Output from previous day: 07/07 0701 - 07/08 0700 In: 1050 [IV Piggyback:1050] Out: -  Intake/Output this shift: No intake/output data recorded.  Lab Results: Recent Labs    06/06/24 1845  WBC 4.2  HGB 10.3*  HCT 32.1*  PLT 246   BMET Recent Labs    06/06/24 2001  NA 140  K 4.1  CL 104  CO2 28  GLUCOSE 106*  BUN 12  CREATININE 0.51  CALCIUM  8.6*   LFT Recent Labs    06/06/24 2001  PROT 6.3*  ALBUMIN  2.4*  AST 48*  ALT 22  ALKPHOS 402*  BILITOT 0.8   PT/INR No results for input(s): LABPROT, INR in the last 72 hours. Hepatitis Panel No results for input(s): HEPBSAG, HCVAB, HEPAIGM, HEPBIGM in the last 72 hours. C-Diff No results for input(s): CDIFFTOX in the last 72 hours.  Studies/Results: CT ABDOMEN PELVIS W CONTRAST Result Date: 06/07/2024 CLINICAL DATA:  Acute nonlocalized abdominal pain. History of gastric ulcers. EXAM: CT ABDOMEN AND PELVIS WITH CONTRAST TECHNIQUE: Multidetector CT imaging of the abdomen and pelvis was performed using the standard protocol following bolus administration of intravenous contrast. RADIATION DOSE REDUCTION: This exam was performed according to the departmental dose-optimization program which includes automated exposure control, adjustment of the mA and/or kV according to patient size and/or use of iterative reconstruction technique. CONTRAST:  OMNIPAQUE  IOHEXOL  300 MG/ML  SOLN COMPARISON:  05/30/2024 FINDINGS: Lower chest: Lung bases are clear. Hepatobiliary: Biliary stent in place. Pneumobilia consistent with stent placement. Surgical absence of the gallbladder. No focal liver lesions. Similar appearance to prior study. Pancreas: Diffuse pancreatic  ductal dilatation. Mild infiltration in the region of the head of the pancreas. Small amount of fluid in the anterior pararenal fascia. Changes may indicate acute pancreatitis. Mild progression since prior study. Spleen: Normal in size without focal abnormality. Adrenals/Urinary Tract: Heterogeneously enhancing mass in the left kidney measuring 3.1 cm diameter. Similar appearance to previous study. The appearance is most consistent with renal cell carcinoma. No hydronephrosis or hydroureter. Bladder is decompressed. Stomach/Bowel: Stomach, small bowel, and colon are not abnormally distended. Wall thickening the pyloric region of the stomach and in the duodenum possibly representing gastritis/peptic ulcer disease with duodenitis. Stool-filled colon. Appendix is not identified. Vascular/Lymphatic: Aortic atherosclerosis. No enlarged abdominal or pelvic lymph nodes. Reproductive: Status post hysterectomy. No adnexal masses. Other: No free air or free fluid in the abdomen. Abdominal wall musculature appears intact. Fatty atrophy of the abdominal  musculature. Implant in the anterior subcutaneous fat over the right lower quadrant probably medication portal. Musculoskeletal: Compression deformity of L2 unchanged since prior study. Severe degenerative changes at L1-2 and L2-3 levels. No acute bony abnormalities. IMPRESSION: 1. Pancreatic ductal dilatation with prominence of the pancreatic head and mild stranding around the pancreatic head. This may indicate pancreatitis and is progressed since prior study. 2. Wall thickening in the pyloric region of the stomach and in the duodenum suggesting gastric duodenitis and/or peptic ulcer disease. 3. Pneumobilia due to biliary duct stent. 4. Heterogeneously enhancing mass in the left kidney is unchanged since prior study. Appearance is most consistent with renal cell carcinoma. 5. Aortic atherosclerosis. Electronically Signed   By: Elsie Gravely M.D.   On: 06/07/2024 00:16     Impression: 78 y.o. year old female  with history of metastatic breast cancer to bone on Faslodex  and Xgeva  and has intrathecal pump, left renal mass concerning for cancer, cholangitis April and May 2025 with stent placement at [facility], duodenal ulcers April 2024 noted at the time of first ERCP with associated acquired duodenal stenosis, CAD, CKD, COPD, anxiety, depression, PE, afib, on Eliquis , who presented to the emergency room due to persistent upper to mid abdominal pain, found to have likely pancreatitis on CT along with gastroduodenitis and/or PUD, and stool-filled colon.   Pancreatitis:  CT A/P with contrast demonstrated pancreatic ductal dilation with prominence of pancreatic head and mild stranding around the pancreatic head that may indicate pancreatitis.  Lipase only mildly elevated at 69 I do note that pancreatic duct dilation appears to be a newer finding as this was not demonstrated on MRCP in April or CT scans until 05/30/24.   Etiology of pancreatitis isn't clear. Denies new medication, NSAIDs, ETOH. She has history of cholangitis with  debris in her CBD, but has metal stent in place.  Alk phos is elevated, but has been slowly improving since placement of biliary stent, and bili has remained normal making pancreatitis less likely to be a biliary issue, but unable to rule this out. Considering new pancreatic duct dilation, would consider MRCP to re-evaluate this area, but will need to confirm whether or not stent is MRI compatible.   Despite patient having abdominal pain which may be multifactorial in the setting of pancreatitis, PUD, and constipation, she denies postprandial symptoms and is asking for something to eat.   History of PUD:  Duodenal ulcers noted on ERCP in April 2024.  No biopsies were taken.  She is been on PPI twice daily since that time and possibly Carafate , but unclear if she is taking as prescribed.  CT this admission with wall thickening in the pyloric region of  the stomach and duodenum suggesting gastroduodenitis and/or PUD.  She will need repeat EGD to evaluate further, document ulcer healing, and biopsy to rule out H. pylori. Discussed with Dr. Eartha. Can plan for EGD this admission. Will need to hold Eliquis  x 48 hours prior. Received dose this morning.   Constipation:  Chronic. Not adequately managed.  Could certainly be contributing to her chronic abdominal pain.  Previously improved with MiraLAX  twice daily, but has not been taking daily as outpatient.   Plan: Full liquid diet.  Consider MRI/MRCP to evaluate pancreatic duct dilation. Will need to confirm that metal stent is MRI compatible.  We will reach out to Dr.  Jinny.  Recommend EGD this admission once Eliquis  has been held for 48 hours.  Received Eliquis  this morning, so likely Thursday.  Hold  Eliquis  starting now.  Start MiraLAX  17 g twice daily.  Continue supportive measures.    LOS: 0 days    06/07/2024, 10:09 AM   [facility], [facility]

## 2024-06-07 NOTE — Plan of Care (Signed)

## 2024-06-07 NOTE — H&P (Addendum)
 History and Physical    Reverie  B [name] FMW:[id number] DOB: 11/21/1946 DOA: 06/06/2024  PCP: Rosamond Leta NOVAK, MD   Patient coming from: Home  Chief Complaint: Abdominal pain  HPI: Stacey [name] is a 78 y.o. female with medical history significant for biliary stricture status post stenting, ascending cholangitis, breast cancer metastasized to bone-follows at [facility], pathological L1 fracture, chronic pain with intrathecal pump (morphine /bupivacaine ), PE, atrial fibrillation, CAD with prior stenting, CKD stage IIIb, left-sided renal mass, COPD, anxiety, and depression who presented to the ED with generalized abdominal pain.  She was seen in the ED 1 week ago for abdominal pain and was treated with fentanyl  and Zofran , but the pain has been ongoing since then.  Her home medications have not been helping and she states that she has not been eating much as the pain seems to worsen with oral intake.  She denies any nausea or vomiting and last bowel movement was yesterday, but she has been more constipated.   ED Course: Vital signs stable and patient afebrile.  Given 1 L fluid bolus as well as pain medications.  Lipase 69 and alkaline phosphatase 402 with mild AST elevation of 48.  CT imaging with signs of inflammation around the pancreatic head noted.  Review of Systems: Reviewed as noted above, otherwise negative.  Past Medical History:  Diagnosis Date   CAD in native artery    a. DES to ramus 2005 with late stent thrombosis 2006 tx with PTCA. 12/18 PCI/DES x1 to mRCA, EF 50-55%   CHF (congestive heart failure) (HCC)    Chronic pain    COPD (chronic obstructive pulmonary disease) (HCC)    Hyperlipidemia    Hypertension    Left bundle branch block    Lymphedema    Metastatic breast cancer    a. to bone.   MI (myocardial infarction) (HCC)    Mild aortic stenosis 10/2017   Morbid obesity (HCC)    PAF (paroxysmal atrial fibrillation) (HCC)    PSVT (paroxysmal supraventricular tachycardia)  (HCC)    a. per [facility] notes, seen on event monitor in 2014.   Pulmonary nodules     Past Surgical History:  Procedure Laterality Date   BILIARY STENT PLACEMENT  03/09/2024   Procedure: INSERTION, STENT, BILE DUCT;  Surgeon: Jinny Carmine, MD;  Location: [facility] ENDOSCOPY;  Service: Endoscopy;;   CHOLECYSTECTOMY N/A 03/29/2021   Procedure: LAPAROSCOPIC CHOLECYSTECTOMY;  Surgeon: Kallie Manuelita BROCKS, MD;  Location: [facility] ORS;  Service: General;  Laterality: N/A;   CORONARY STENT INTERVENTION N/A 11/26/2017   Procedure: CORONARY STENT INTERVENTION;  Surgeon: Swaziland, Peter M, MD;  Location: [facility] INVASIVE CV LAB;  Service: Cardiovascular;  Laterality: N/A;   CORONARY STENT PLACEMENT     ERCP N/A 03/28/2021   Procedure: ENDOSCOPIC RETROGRADE CHOLANGIOPANCREATOGRAPHY (ERCP);  Surgeon: Golda Claudis PENNER, MD;  Location: [facility];  Service: Endoscopy;  Laterality: N/A;   ERCP N/A 03/09/2024   Procedure: ERCP, WITH INTERVENTION IF INDICATED;  Surgeon: Jinny Carmine, MD;  Location: [facility] ENDOSCOPY;  Service: Endoscopy;  Laterality: N/A;   ERCP N/A 04/15/2024   Procedure: ERCP, WITH INTERVENTION IF INDICATED;  Surgeon: Jinny Carmine, MD;  Location: [facility] ENDOSCOPY;  Service: Endoscopy;  Laterality: N/A;   ESOPHAGOGASTRODUODENOSCOPY  03/09/2024   Procedure: EGD (ESOPHAGOGASTRODUODENOSCOPY);  Surgeon: Jinny Carmine, MD;  Location: [facility] ENDOSCOPY;  Service: Endoscopy;;   intrathecal pain pump     LEFT HEART CATH AND CORONARY ANGIOGRAPHY N/A 11/26/2017   Procedure: LEFT HEART CATH AND CORONARY ANGIOGRAPHY;  Surgeon:  Swaziland, Peter M, MD;  Location: [facility] INVASIVE CV LAB;  Service: Cardiovascular;  Laterality: N/A;   REMOVAL OF STONES  03/28/2021   Procedure: REMOVAL OF STONES;  Surgeon: Golda Claudis PENNER, MD;  Location: [facility];  Service: Endoscopy;;   SPHINCTEROTOMY  03/28/2021   Procedure: SPHINCTEROTOMY;  Surgeon: Golda Claudis PENNER, MD;  Location: [facility];  Service: Endoscopy;;   UMBILICAL HERNIA REPAIR N/A 04/03/2021   Procedure:  ADULT PRIMARY UMBILICAL HERNIA REPAIR;  Surgeon: Kallie Manuelita BROCKS, MD;  Location: [facility];  Service: General;  Laterality: N/A;   VASCULAR SURGERY       reports that she quit smoking about 43 years ago. Her smoking use included cigarettes. She started smoking about 61 years ago. She has a 36 pack-year smoking history. She has never used smokeless tobacco. She reports that she does not drink alcohol  and does not use drugs.  Allergies  Allergen Reactions   Albuterol  Other (See Comments)    Heart racing    Brilinta  [Ticagrelor ] Diarrhea and Nausea Only    Nausea and severe diarrhea- general weakness. Patient says does not want to take again   Budesonide  Palpitations    Family History  Problem Relation Age of Onset   CAD Father 37   Heart attack Father    COPD Sister    CAD Paternal Grandmother    Sudden Cardiac Death Neg Hx    Colon polyps Neg Hx    Colon cancer Neg Hx     Prior to Admission medications   Medication Sig Start Date End Date Taking? Authorizing Provider  acetaminophen  (TYLENOL ) 325 MG tablet Take 2 tablets (650 mg total) by mouth every 6 (six) hours as needed for mild pain (pain score 1-3) or fever (or Fever >/= 101). 05/20/24   Pearlean Manus, MD  ALPRAZolam  (XANAX ) 0.5 MG tablet Take 1 tablet (0.5 mg total) by mouth 2 (two) times daily as needed for anxiety or sleep. 05/20/24   Pearlean Manus, MD  apixaban  (ELIQUIS ) 2.5 MG TABS tablet Take 2.5 mg by mouth daily.    [provider]  b complex vitamins capsule Take 1 capsule by mouth daily.    [provider]  carboxymethylcellulose (REFRESH PLUS) 0.5 % SOLN Place 1 drop into both eyes 3 (three) times daily as needed (Dry Eyes).    [provider]  clopidogrel  (PLAVIX ) 75 MG tablet Take 75 mg by mouth daily.    [provider]  DULoxetine  (CYMBALTA ) 30 MG capsule Take 30 mg by mouth 2 (two) times daily.    [provider]  ferrous sulfate  325 (65 FE) MG EC tablet Take 325  mg by mouth daily with breakfast.    [provider]  HYDROmorphone  (DILAUDID ) 1 MG/ML injection Inject 0.5 mLs (0.5 mg total) into the vein every 2 (two) hours as needed for severe pain (pain score 7-10) or moderate pain (pain score 4-6). 03/09/24   Drusilla Sabas RAMAN, MD  HYDROmorphone  (DILAUDID ) 2 MG tablet Take 4 mg by mouth every 4 (four) hours as needed for severe pain (pain score 7-10).    [provider]  methocarbamol  (ROBAXIN ) 500 MG tablet Take 500 mg by mouth every 6 (six) hours as needed for muscle spasms.    [provider]  metoprolol  succinate (TOPROL -XL) 25 MG 24 hr tablet Take 3 tablets (75 mg total) by mouth daily. 03/19/24 05/15/24  Trudy Anthony HERO, MD  nitroGLYCERIN  (NITROSTAT ) 0.4 MG SL tablet Place 0.4 mg under the tongue every  5 (five) minutes as needed for chest pain.    [provider]  pantoprazole  (PROTONIX ) 40 MG tablet Take 1 tablet (40 mg total) by mouth 2 (two) times daily. 05/20/24 05/20/25  Pearlean Manus, MD  polyethylene glycol (MIRALAX  / GLYCOLAX ) 17 g packet Take 17 g by mouth 2 (two) times daily. 05/20/24   Pearlean Manus, MD  senna-docusate (SENOKOT-S) 8.6-50 MG tablet Take 2 tablets by mouth at bedtime. 05/20/24 05/20/25  Pearlean Manus, MD  sucralfate  (CARAFATE ) 1 GM/10ML suspension Take 10 mLs (1 g total) by mouth 4 (four) times daily -  before meals and at bedtime. 05/20/24   Pearlean Manus, MD    Physical Exam: Vitals:   06/07/24 0115 06/07/24 0801 06/07/24 0809 06/07/24 0900  BP:   (!) 156/72 (!) 162/87  Pulse:   (!) 56 (!) 105  Resp:   18 18  Temp: 98.4 F (36.9 C) 97.8 F (36.6 C)  98.2 F (36.8 C)  TempSrc: Oral Oral  Oral  SpO2:   99% 99%  Weight:      Height:        Constitutional: NAD, calm, comfortable Vitals:   06/07/24 0115 06/07/24 0801 06/07/24 0809 06/07/24 0900  BP:   (!) 156/72 (!) 162/87  Pulse:   (!) 56 (!) 105  Resp:   18 18  Temp: 98.4 F (36.9 C) 97.8 F (36.6 C)  98.2 F (36.8 C)   TempSrc: Oral Oral  Oral  SpO2:   99% 99%  Weight:      Height:       Eyes: lids and conjunctivae normal Neck: normal, supple Respiratory: clear to auscultation bilaterally. Normal respiratory effort. No accessory muscle use.  Cardiovascular: Regular rate and rhythm, no murmurs. Abdomen: no tenderness, no distention. Bowel sounds positive.  Musculoskeletal:  No edema. Skin: no rashes, lesions, ulcers.  Psychiatric: Flat affect  Labs on Admission: I have personally reviewed following labs and imaging studies  CBC: Recent Labs  Lab 06/02/24 1001 06/06/24 1845  WBC 4.2 4.2  NEUTROABS 2.6  --   HGB 9.8* 10.3*  HCT 31.2* 32.1*  MCV 109.9* 106.6*  PLT 207 246   Basic Metabolic Panel: Recent Labs  Lab 06/02/24 1001 06/06/24 2001  NA 136 140  K 3.6 4.1  CL 102 104  CO2 26 28  GLUCOSE 134* 106*  BUN 13 12  CREATININE 0.56 0.51  CALCIUM  8.1* 8.6*   GFR: Estimated Creatinine Clearance: 54.8 mL/min (by C-G formula based on SCr of 0.51 mg/dL). Liver Function Tests: Recent Labs  Lab 06/02/24 1001 06/06/24 2001  AST 50* 48*  ALT 22 22  ALKPHOS 453* 402*  BILITOT 0.7 0.8  PROT 6.1* 6.3*  ALBUMIN  2.2* 2.4*   Recent Labs  Lab 06/06/24 2001  LIPASE 69*   No results for input(s): AMMONIA in the last 168 hours. Coagulation Profile: No results for input(s): INR, PROTIME in the last 168 hours. Cardiac Enzymes: No results for input(s): CKTOTAL, CKMB, CKMBINDEX, TROPONINI in the last 168 hours. BNP (last 3 results) No results for input(s): PROBNP in the last 8760 hours. HbA1C: No results for input(s): HGBA1C in the last 72 hours. CBG: No results for input(s): GLUCAP in the last 168 hours. Lipid Profile: No results for input(s): CHOL, HDL, LDLCALC, TRIG, CHOLHDL, LDLDIRECT in the last 72 hours. Thyroid  Function Tests: No results for input(s): TSH, T4TOTAL, FREET4, T3FREE, THYROIDAB in the last 72 hours. Anemia Panel: No  results for input(s): VITAMINB12, FOLATE, FERRITIN, TIBC, IRON,  RETICCTPCT in the last 72 hours. Urine analysis:    Component Value Date/Time   COLORURINE YELLOW 06/06/2024 2346   APPEARANCEUR HAZY (A) 06/06/2024 2346   LABSPEC 1.009 06/06/2024 2346   PHURINE 7.0 06/06/2024 2346   GLUCOSEU NEGATIVE 06/06/2024 2346   HGBUR NEGATIVE 06/06/2024 2346   BILIRUBINUR NEGATIVE 06/06/2024 2346   KETONESUR NEGATIVE 06/06/2024 2346   PROTEINUR NEGATIVE 06/06/2024 2346   NITRITE NEGATIVE 06/06/2024 2346   LEUKOCYTESUR NEGATIVE 06/06/2024 2346    Radiological Exams on Admission: CT ABDOMEN PELVIS W CONTRAST Result Date: 06/07/2024 CLINICAL DATA:  Acute nonlocalized abdominal pain. History of gastric ulcers. EXAM: CT ABDOMEN AND PELVIS WITH CONTRAST TECHNIQUE: Multidetector CT imaging of the abdomen and pelvis was performed using the standard protocol following bolus administration of intravenous contrast. RADIATION DOSE REDUCTION: This exam was performed according to the departmental dose-optimization program which includes automated exposure control, adjustment of the mA and/or kV according to patient size and/or use of iterative reconstruction technique. CONTRAST:  OMNIPAQUE  IOHEXOL  300 MG/ML  SOLN COMPARISON:  05/30/2024 FINDINGS: Lower chest: Lung bases are clear. Hepatobiliary: Biliary stent in place. Pneumobilia consistent with stent placement. Surgical absence of the gallbladder. No focal liver lesions. Similar appearance to prior study. Pancreas: Diffuse pancreatic ductal dilatation. Mild infiltration in the region of the head of the pancreas. Small amount of fluid in the anterior pararenal fascia. Changes may indicate acute pancreatitis. Mild progression since prior study. Spleen: Normal in size without focal abnormality. Adrenals/Urinary Tract: Heterogeneously enhancing mass in the left kidney measuring 3.1 cm diameter. Similar appearance to previous study. The appearance is most  consistent with renal cell carcinoma. No hydronephrosis or hydroureter. Bladder is decompressed. Stomach/Bowel: Stomach, small bowel, and colon are not abnormally distended. Wall thickening the pyloric region of the stomach and in the duodenum possibly representing gastritis/peptic ulcer disease with duodenitis. Stool-filled colon. Appendix is not identified. Vascular/Lymphatic: Aortic atherosclerosis. No enlarged abdominal or pelvic lymph nodes. Reproductive: Status post hysterectomy. No adnexal masses. Other: No free air or free fluid in the abdomen. Abdominal wall musculature appears intact. Fatty atrophy of the abdominal musculature. Implant in the anterior subcutaneous fat over the right lower quadrant probably medication portal. Musculoskeletal: Compression deformity of L2 unchanged since prior study. Severe degenerative changes at L1-2 and L2-3 levels. No acute bony abnormalities. IMPRESSION: 1. Pancreatic ductal dilatation with prominence of the pancreatic head and mild stranding around the pancreatic head. This may indicate pancreatitis and is progressed since prior study. 2. Wall thickening in the pyloric region of the stomach and in the duodenum suggesting gastric duodenitis and/or peptic ulcer disease. 3. Pneumobilia due to biliary duct stent. 4. Heterogeneously enhancing mass in the left kidney is unchanged since prior study. Appearance is most consistent with renal cell carcinoma. 5. Aortic atherosclerosis. Electronically Signed   By: Elsie Gravely M.D.   On: 06/07/2024 00:16    Assessment/Plan Principal Problem:   Acute pancreatitis Active Problems:   Atrial fibrillation, chronic (HCC)   HLD (hyperlipidemia)   COPD (chronic obstructive pulmonary disease) (HCC)   HTN (hypertension)   Anxiety and depression   Metastasis to bone (HCC)   Malignant neoplasm of right breast in female, estrogen receptor positive (HCC)   Liver lesion   Biliary stricture    Acute pancreatitis - History of  recent biliary stricture and ascending cholangitis status post stent placement and status post stent exchange -Admissions earlier this year for cholangitis -Currently n.p.o. 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 Rogers - Not currently on treatment  Elevated alkaline phosphatase - Likely secondary to metastatic disease  Liver mass - Outpatient biopsy and follow-up - Does not appear to have had any follow-up  Left upper lobe renal mass - Possible renal cell carcinoma - Does not appear to have had any follow-up  Chronic pain - Intrathecal pump - Additional IV morphine  as needed for pain management  Obesity, class I - BMI 34.01   DVT prophylaxis: Eliquis  Code Status: DNR Family Communication: None at bedside Disposition Plan: Admit for pancreatitis Consults called: GI Admission status: Inpatient, MedSurg  Severity of Illness: The appropriate patient status for this patient is INPATIENT. Inpatient status is judged to be reasonable and necessary in order to provide the required intensity of service to ensure the patient's safety. The patient's presenting symptoms, physical exam findings, and initial radiographic and laboratory data in the context of their chronic comorbidities is felt to place them at high risk for further clinical deterioration. Furthermore, it is not anticipated that the patient will be medically stable for discharge from the hospital within 2 midnights of admission.   * I certify that at the point of admission it is my  clinical judgment that the patient will require inpatient hospital care spanning beyond 2 midnights from the point of admission due to high intensity of service, high risk for further deterioration and high frequency of surveillance required.*   Alaa Mullally D Mariane Burpee DO [facility]  If 7PM-7AM, please contact night-coverage [email or web address]  06/07/2024, 9:47 AM

## 2024-06-08 ENCOUNTER — Inpatient Hospital Stay (HOSPITAL_COMMUNITY)

## 2024-06-08 DIAGNOSIS — R748 Abnormal levels of other serum enzymes: Secondary | ICD-10-CM

## 2024-06-08 DIAGNOSIS — D649 Anemia, unspecified: Secondary | ICD-10-CM

## 2024-06-08 DIAGNOSIS — K859 Acute pancreatitis without necrosis or infection, unspecified: Secondary | ICD-10-CM | POA: Diagnosis not present

## 2024-06-08 DIAGNOSIS — K921 Melena: Secondary | ICD-10-CM

## 2024-06-08 DIAGNOSIS — R1010 Upper abdominal pain, unspecified: Secondary | ICD-10-CM

## 2024-06-08 LAB — COMPREHENSIVE METABOLIC PANEL WITH GFR
ALT: 21 U/L (ref 0–44)
AST: 44 U/L — ABNORMAL HIGH (ref 15–41)
Albumin: 2.2 g/dL — ABNORMAL LOW (ref 3.5–5.0)
Alkaline Phosphatase: 384 U/L — ABNORMAL HIGH (ref 38–126)
Anion gap: 9 (ref 5–15)
BUN: 9 mg/dL (ref 8–23)
CO2: 27 mmol/L (ref 22–32)
Calcium: 8.5 mg/dL — ABNORMAL LOW (ref 8.9–10.3)
Chloride: 103 mmol/L (ref 98–111)
Creatinine, Ser: 0.46 mg/dL (ref 0.44–1.00)
GFR, Estimated: >60 mL/min (ref >60)
Glucose, Bld: 102 mg/dL — ABNORMAL HIGH (ref 70–99)
Potassium: 3.8 mmol/L (ref 3.5–5.1)
Sodium: 139 mmol/L (ref 135–145)
Total Bilirubin: 0.9 mg/dL (ref 0.0–1.2)
Total Protein: 6 g/dL — ABNORMAL LOW (ref 6.5–8.1)

## 2024-06-08 LAB — CBC
HCT: 35.1 % — ABNORMAL LOW (ref 36.0–46.0)
Hemoglobin: 11.3 g/dL — ABNORMAL LOW (ref 12.0–15.0)
MCH: 34.2 pg — ABNORMAL HIGH (ref 26.0–34.0)
MCHC: 32.2 g/dL (ref 30.0–36.0)
MCV: 106.4 fL — ABNORMAL HIGH (ref 80.0–100.0)
Platelets: 204 K/uL (ref 150–400)
RBC: 3.3 MIL/uL — ABNORMAL LOW (ref 3.87–5.11)
RDW: 14.3 % (ref 11.5–15.5)
WBC: 4.4 K/uL (ref 4.0–10.5)
nRBC: 0 % (ref 0.0–0.2)

## 2024-06-08 LAB — MAGNESIUM: Magnesium: 1.8 mg/dL (ref 1.7–2.4)

## 2024-06-08 MED ORDER — ONDANSETRON HCL 4 MG/2ML IJ SOLN
4.0000 mg | Freq: Four times a day (QID) | INTRAMUSCULAR | Status: DC | PRN
  Administered 2024-06-08: 4 mg via INTRAVENOUS
  Filled 2024-06-08: qty 2

## 2024-06-08 MED ORDER — ENOXAPARIN SODIUM 40 MG/0.4ML IJ SOSY: 40.0000 mg | PREFILLED_SYRINGE | INTRAMUSCULAR | Status: DC

## 2024-06-08 MED ORDER — GADOBUTROL 1 MMOL/ML IV SOLN
7.0000 mL | Freq: Once | INTRAVENOUS | Status: AC | PRN
  Administered 2024-06-08: 7 mL via INTRAVENOUS

## 2024-06-08 NOTE — Progress Notes (Addendum)
 Gastroenterology Progress Note   Referring Provider: No ref. provider found Primary Care Physician:  Rosamond Leta NOVAK, MD Primary Gastroenterologist:  Toribio Fortune, MD Patient ID: Stacey [name]; [id number]; [birth year]   Subjective:    Doing ok. Some persistent upper abdominal pain. No n/v. Does not really like the grits. Has a large BM this morning. She is not sure of color, nurse tech assisted her.   Objective:   Vital signs in last 24 hours: Temp:  [97.9 F (36.6 C)-98.1 F (36.7 C)] 98.1 F (36.7 C) (07/09 1300) Pulse Rate:  [78-99] 78 (07/09 1300) Resp:  [12-18] 15 (07/09 1300) BP: (121-181)/(64-93) 121/64 (07/09 1300) SpO2:  [92 %-100 %] 96 % (07/09 1300) Last BM Date : 06/07/24 General:   Alert,  Well-developed, well-nourished, pleasant and cooperative in NAD. Sister at bedside. Head:  Normocephalic and atraumatic. Eyes:  Sclera clear, no icterus.   Abdomen:  Soft, nondistended. Exam limited by body habitus. Mild epigastric tenderness. Normal bowel sounds, without guarding, and without rebound.  Pump noted rlq. Extremities:  Without clubbing, deformity or edema. Neurologic:  Alert and  oriented x4;  grossly normal neurologically. Skin:  Intact without significant lesions or rashes. Psych:  Alert and cooperative. Normal mood and affect.  Intake/Output from previous day: 07/08 0701 - 07/09 0700 In: 1176.6 [I.V.:1176.6] Out: 600 [Urine:600] Intake/Output this shift: Total I/O In: -  Out: 400 [Urine:400]  Lab Results: CBC Recent Labs    06/06/24 1845 06/08/24 0446  WBC 4.2 4.4  HGB 10.3* 11.3*  HCT 32.1* 35.1*  MCV 106.6* 106.4*  PLT 246 204   BMET Recent Labs    06/06/24 2001 06/08/24 0446  NA 140 139  K 4.1 3.8  CL 104 103  CO2 28 27  GLUCOSE 106* 102*  BUN 12 9  CREATININE 0.51 0.46  CALCIUM  8.6* 8.5*   LFTs Recent Labs    06/06/24 2001 06/08/24 0446  BILITOT 0.8 0.9  ALKPHOS 402* 384*  AST 48* 44*  ALT 22 21  PROT 6.3* 6.0*   ALBUMIN  2.4* 2.2*   Recent Labs    06/06/24 2001  LIPASE 69*   PT/INR No results for input(s): LABPROT, INR in the last 72 hours.       Imaging Studies: MR ABDOMEN MRCP W WO CONTAST Result Date: 06/08/2024 CLINICAL DATA:  pancreatitis, pancreatic duct dilation, history of cholangitis with current biliary stent in place; 855384 Pain 144615 pancreatitis,. EXAM: MRI ABDOMEN WITHOUT AND WITH CONTRAST (INCLUDING MRCP) TECHNIQUE: Multiplanar multisequence MR imaging of the abdomen was performed both before and after the administration of intravenous contrast. Heavily T2-weighted images of the biliary and pancreatic ducts were obtained, and three-dimensional MRCP images were rendered by post processing. CONTRAST:  7mL GADAVIST  GADOBUTROL  1 MMOL/ML IV SOLN COMPARISON:  CT scan abdomen and pelvis from 06/07/2024. FINDINGS: Lower chest: Unremarkable MR appearance to the lung bases. No pleural effusion. No pericardial effusion. Normal heart size. Hepatobiliary: The liver is normal in size and configuration. No intrahepatic bile duct dilation. There is susceptibility artifacts in the extrahepatic bile duct from metallic stent. There is pneumobilia mainly in the left lobe ducts. The gallbladder is surgically absent. There are susceptibility artifacts in the gallbladder fossa region, from cholecystectomy clips. Pancreas: Redemonstration of diffuse a trophy of the pancreas with resultant dilated main pancreatic duct measuring up to 6-7 mm in the neck/body region. There is subtle mildly hypoenhancing area measuring at least 1.3 x 1.6 cm in the pancreatic head on the left  side of the main pancreatic duct. There is associated after up cutoff of the main pancreatic duct. The area appears slightly T2 hyperintense. It is not well evaluated on the diffusion-weighted images due to extensive artifacts. This is indeterminate in etiology and differential diagnosis includes focal pancreatic adenocarcinoma versus mass  forming chronic pancreatitis. EUS and FNA is recommended for differentiation. No peripancreatic fat stranding or peripancreatic walled-off collections. There is trace amount of fluid in the left paracolic gutter, likely sequela of pancreatitis. Spleen:  Within normal limits in size and appearance. No focal mass. Adrenals/Urinary Tract: Unremarkable adrenal glands. Redemonstration of previously seen heterogeneously enhancing 2.5 x 2.8 x 3.1 cm mass arising from the left kidney upper pole/interpolar region, anterolaterally. There is a simple cyst arising from the left kidney lower pole, posteriorly measuring up to 1.3 x 1.4 cm. Bilateral kidneys are otherwise essentially unremarkable. No hydronephrosis. Right extrarenal pelvis noted. Stomach/Bowel: Visualized portions within the abdomen are unremarkable. No disproportionate dilation of bowel loops. Vascular/Lymphatic: No pathologically enlarged lymph nodes identified. No abdominal aortic aneurysm demonstrated. No ascites. Other:  None. Musculoskeletal: No suspicious bone lesions identified. There is moderate compression deformity of L1 vertebrae, better evaluated on the CT scan exam. IMPRESSION: 1. There is subtle mildly hypoenhancing area in the pancreatic head region, which is indeterminate in etiology and differential diagnosis includes focal pancreatic adenocarcinoma versus mass forming chronic pancreatitis. Correlation with EUS and FNA is recommended for differentiation. 2. Redemonstration of previously seen heterogeneously enhancing 2.5 x 2.8 x 3.1 cm mass arising from the left kidney upper pole/interpolar region, anterolaterally, compatible with renal cell carcinoma. 3. No metastatic disease identified within the abdomen. Electronically Signed   By: Ree Molt M.D.   On: 06/08/2024 09:43   MR 3D Recon At Scanner Result Date: 06/08/2024 CLINICAL DATA:  pancreatitis, pancreatic duct dilation, history of cholangitis with current biliary stent in place;  855384 Pain 144615 pancreatitis,. EXAM: MRI ABDOMEN WITHOUT AND WITH CONTRAST (INCLUDING MRCP) TECHNIQUE: Multiplanar multisequence MR imaging of the abdomen was performed both before and after the administration of intravenous contrast. Heavily T2-weighted images of the biliary and pancreatic ducts were obtained, and three-dimensional MRCP images were rendered by post processing. CONTRAST:  7mL GADAVIST  GADOBUTROL  1 MMOL/ML IV SOLN COMPARISON:  CT scan abdomen and pelvis from 06/07/2024. FINDINGS: Lower chest: Unremarkable MR appearance to the lung bases. No pleural effusion. No pericardial effusion. Normal heart size. Hepatobiliary: The liver is normal in size and configuration. No intrahepatic bile duct dilation. There is susceptibility artifacts in the extrahepatic bile duct from metallic stent. There is pneumobilia mainly in the left lobe ducts. The gallbladder is surgically absent. There are susceptibility artifacts in the gallbladder fossa region, from cholecystectomy clips. Pancreas: Redemonstration of diffuse a trophy of the pancreas with resultant dilated main pancreatic duct measuring up to 6-7 mm in the neck/body region. There is subtle mildly hypoenhancing area measuring at least 1.3 x 1.6 cm in the pancreatic head on the left side of the main pancreatic duct. There is associated after up cutoff of the main pancreatic duct. The area appears slightly T2 hyperintense. It is not well evaluated on the diffusion-weighted images due to extensive artifacts. This is indeterminate in etiology and differential diagnosis includes focal pancreatic adenocarcinoma versus mass forming chronic pancreatitis. EUS and FNA is recommended for differentiation. No peripancreatic fat stranding or peripancreatic walled-off collections. There is trace amount of fluid in the left paracolic gutter, likely sequela of pancreatitis. Spleen:  Within normal limits in size and appearance.  No focal mass. Adrenals/Urinary Tract:  Unremarkable adrenal glands. Redemonstration of previously seen heterogeneously enhancing 2.5 x 2.8 x 3.1 cm mass arising from the left kidney upper pole/interpolar region, anterolaterally. There is a simple cyst arising from the left kidney lower pole, posteriorly measuring up to 1.3 x 1.4 cm. Bilateral kidneys are otherwise essentially unremarkable. No hydronephrosis. Right extrarenal pelvis noted. Stomach/Bowel: Visualized portions within the abdomen are unremarkable. No disproportionate dilation of bowel loops. Vascular/Lymphatic: No pathologically enlarged lymph nodes identified. No abdominal aortic aneurysm demonstrated. No ascites. Other:  None. Musculoskeletal: No suspicious bone lesions identified. There is moderate compression deformity of L1 vertebrae, better evaluated on the CT scan exam. IMPRESSION: 1. There is subtle mildly hypoenhancing area in the pancreatic head region, which is indeterminate in etiology and differential diagnosis includes focal pancreatic adenocarcinoma versus mass forming chronic pancreatitis. Correlation with EUS and FNA is recommended for differentiation. 2. Redemonstration of previously seen heterogeneously enhancing 2.5 x 2.8 x 3.1 cm mass arising from the left kidney upper pole/interpolar region, anterolaterally, compatible with renal cell carcinoma. 3. No metastatic disease identified within the abdomen. Electronically Signed   By: Ree Molt M.D.   On: 06/08/2024 09:43   CT ABDOMEN PELVIS W CONTRAST Result Date: 06/07/2024 CLINICAL DATA:  Acute nonlocalized abdominal pain. History of gastric ulcers. EXAM: CT ABDOMEN AND PELVIS WITH CONTRAST TECHNIQUE: Multidetector CT imaging of the abdomen and pelvis was performed using the standard protocol following bolus administration of intravenous contrast. RADIATION DOSE REDUCTION: This exam was performed according to the departmental dose-optimization program which includes automated exposure control, adjustment of the mA  and/or kV according to patient size and/or use of iterative reconstruction technique. CONTRAST:  OMNIPAQUE  IOHEXOL  300 MG/ML  SOLN COMPARISON:  05/30/2024 FINDINGS: Lower chest: Lung bases are clear. Hepatobiliary: Biliary stent in place. Pneumobilia consistent with stent placement. Surgical absence of the gallbladder. No focal liver lesions. Similar appearance to prior study. Pancreas: Diffuse pancreatic ductal dilatation. Mild infiltration in the region of the head of the pancreas. Small amount of fluid in the anterior pararenal fascia. Changes may indicate acute pancreatitis. Mild progression since prior study. Spleen: Normal in size without focal abnormality. Adrenals/Urinary Tract: Heterogeneously enhancing mass in the left kidney measuring 3.1 cm diameter. Similar appearance to previous study. The appearance is most consistent with renal cell carcinoma. No hydronephrosis or hydroureter. Bladder is decompressed. Stomach/Bowel: Stomach, small bowel, and colon are not abnormally distended. Wall thickening the pyloric region of the stomach and in the duodenum possibly representing gastritis/peptic ulcer disease with duodenitis. Stool-filled colon. Appendix is not identified. Vascular/Lymphatic: Aortic atherosclerosis. No enlarged abdominal or pelvic lymph nodes. Reproductive: Status post hysterectomy. No adnexal masses. Other: No free air or free fluid in the abdomen. Abdominal wall musculature appears intact. Fatty atrophy of the abdominal musculature. Implant in the anterior subcutaneous fat over the right lower quadrant probably medication portal. Musculoskeletal: Compression deformity of L2 unchanged since prior study. Severe degenerative changes at L1-2 and L2-3 levels. No acute bony abnormalities. IMPRESSION: 1. Pancreatic ductal dilatation with prominence of the pancreatic head and mild stranding around the pancreatic head. This may indicate pancreatitis and is progressed since prior study. 2. Wall  thickening in the pyloric region of the stomach and in the duodenum suggesting gastric duodenitis and/or peptic ulcer disease. 3. Pneumobilia due to biliary duct stent. 4. Heterogeneously enhancing mass in the left kidney is unchanged since prior study. Appearance is most consistent with renal cell carcinoma. 5. Aortic atherosclerosis. Electronically Signed  By: Elsie Gravely M.D.   On: 06/07/2024 00:16   CT ABDOMEN PELVIS W CONTRAST Result Date: 05/30/2024 CLINICAL DATA:  Abdominal pain since this morning EXAM: CT ABDOMEN AND PELVIS WITH CONTRAST TECHNIQUE: Multidetector CT imaging of the abdomen and pelvis was performed using the standard protocol following bolus administration of intravenous contrast. RADIATION DOSE REDUCTION: This exam was performed according to the departmental dose-optimization program which includes automated exposure control, adjustment of the mA and/or kV according to patient size and/or use of iterative reconstruction technique. CONTRAST:  OMNIPAQUE  IOHEXOL  300 MG/ML  SOLN COMPARISON:  Same day abdominal radiographs and CT 05/18/2024 FINDINGS: Lower chest: No acute abnormality. Hepatobiliary: Unchanged biliary stent and pneumobilia. Cholecystectomy. No focal hepatic lesion. Pancreas: Prominent pancreatic duct measuring up to 6 mm. This may have slightly increased compared to 05/18/2024. Trace stranding about the pancreatic head. Correlate with lipase for acute pancreatitis. Spleen: Unremarkable. Adrenals/Urinary Tract: Normal adrenal glands. Unchanged enhancing heterogenous neoplasm in the left kidney measuring 3.0 cm. No urinary calculi or hydronephrosis. Nondistended bladder. Stomach/Bowel: Normal caliber large and small bowel. Moderate colonic stool burden. Stomach is within normal limits. The appendix is not visualized. Vascular/Lymphatic: Advanced aortic atherosclerotic calcification. No lymphadenopathy. Reproductive: Hysterectomy.  No adnexal mass. Other: Trace free  fluid in the pelvis.  No free intraperitoneal air. Musculoskeletal: No acute fracture. Chronic compression fracture of L1 with retropulsion. IMPRESSION: 1. Increased dilation of the main pancreatic duct now measuring 6 mm. Trace stranding about the pancreatic head. Correlate with lipase for acute pancreatitis. 2. Unchanged biliary stent and pneumobilia. 3. Unchanged 3.0 cm left renal mass compatible with renal cell carcinoma. 4. Severe compression deformity of L1 with retropulsion resulting in severe spinal canal stenosis, unchanged. 5. Aortic Atherosclerosis (ICD10-I70.0). Electronically Signed   By: Norman Gatlin M.D.   On: 05/30/2024 02:45   DG Abdomen 1 View Result Date: 05/30/2024 CLINICAL DATA:  Abdominal pain onset this morning. EXAM: ABDOMEN - 1 VIEW COMPARISON:  CT with IV contrast 05/18/2024. FINDINGS: Nonobstructive bowel pattern with moderate fecal stasis. Right upper quadrant surgical clips and a common bile duct stent are again noted as well as an implanted pump device on the right. There is no supine evidence of free air or other acute radiographic findings. There is a severe chronic compression fracture of the L1 vertebral body, levoscoliosis and advanced degenerative change of the lumbar spine. Lung bases are clear. No new abnormality. IMPRESSION: 1. Nonobstructive bowel pattern with moderate fecal stasis. 2. No other acute radiographic findings. 3. Chronic findings as above. Electronically Signed   By: Francis Quam M.D.   On: 05/30/2024 01:10   DG Chest Port 1 View Result Date: 05/29/2024 CLINICAL DATA:  Upper abdominal pain EXAM: PORTABLE CHEST 1 VIEW COMPARISON:  Chest radiograph 05/15/2024 FINDINGS: Low lung volumes. Stable cardiomediastinal silhouette. Aortic atherosclerotic calcification. No focal consolidation, pleural effusion, or pneumothorax. No displaced rib fractures. IMPRESSION: No active disease. Electronically Signed   By: Norman Gatlin M.D.   On: 05/29/2024 23:05   CT  ABDOMEN PELVIS WO CONTRAST Result Date: 05/18/2024 CLINICAL DATA:  Worsening abdominal pain. EXAM: CT ABDOMEN AND PELVIS WITHOUT CONTRAST TECHNIQUE: Multidetector CT imaging of the abdomen and pelvis was performed following the standard protocol without IV contrast. RADIATION DOSE REDUCTION: This exam was performed according to the departmental dose-optimization program which includes automated exposure control, adjustment of the mA and/or kV according to patient size and/or use of iterative reconstruction technique. COMPARISON:  05/15/2024. FINDINGS: Lower chest: No acute abnormality. Multivessel  coronary artery calcifications. Hepatobiliary: Persistent unchanged pneumobilia with common bile duct stent in place. Stent appears patent. No focal liver abnormality is seen. Gallbladder is surgically absent. Pancreas: Similar prominence of the pancreatic duct measuring up to 4 mm. No surrounding inflammatory changes. Spleen: Unremarkable. Adrenals/Urinary Tract: Adrenal glands are unremarkable. Unchanged complex mass in the mid left kidney, better delineated and measuring up to 3.1 cm on the prior contrast-enhanced CT dated 05/15/2024. No urolithiasis or hydronephrosis. Bladder is unremarkable. Stomach/Bowel: Stomach is within normal limits. No evidence of obstruction. Enteric contrast is visualized to the level of the sigmoid colon. Moderate amount of retained stool is again noted within the colon and rectum. Sigmoid colonic diverticulosis without evidence of acute diverticulitis. Vascular/Lymphatic: Aortic atherosclerosis. Redemonstrated possible calcified dissection flap versus penetrating ulcer in the distal abdominal aorta. No enlarged abdominal or pelvic lymph nodes. Reproductive: Status post hysterectomy. No adnexal masses. Other: No abdominopelvic ascites. No intraperitoneal free air. Implanted pump device in the anterior right abdomen. Musculoskeletal: Redemonstrated severe compression deformity of L1 with  retropulsion resulting in severe spinal canal stenosis and levocurvature. Unchanged sclerosis of the superior L2 endplate. Multilevel degenerative changes of the thoracolumbar spine. IMPRESSION: 1. No acute localizing findings in the abdomen or pelvis. 2. Unchanged complex mass in the mid left kidney, compatible with known renal cell carcinoma, better delineated and measuring up to 3.1 cm on the prior contrast-enhanced CT dated 05/15/2024. 3. Persistent unchanged pneumobilia with patent common bile duct stent in place. 4. Redemonstrated severe compression deformity of L1 with retropulsion resulting in severe spinal canal stenosis and levocurvature. 5. Additional unchanged chronic findings, as above. 6.  Aortic Atherosclerosis (ICD10-I70.0). Electronically Signed   By: Harrietta Sherry M.D.   On: 05/18/2024 17:03   US  Venous Img Lower Unilateral Right (DVT) Result Date: 05/16/2024 CLINICAL DATA:  Right leg pain and swelling EXAM: Right LOWER EXTREMITY VENOUS DOPPLER ULTRASOUND TECHNIQUE: Gray-scale sonography with compression, as well as color and duplex ultrasound, were performed to evaluate the deep venous system(s) from the level of the common femoral vein through the popliteal and proximal calf veins. COMPARISON:  None Available. FINDINGS: VENOUS Normal compressibility of the common femoral, superficial femoral, and popliteal veins, as well as the visualized calf veins. Visualized portions of profunda femoral vein and great saphenous vein unremarkable. No filling defects to suggest DVT on grayscale or color Doppler imaging. Doppler waveforms show normal direction of venous flow, normal respiratory plasticity and response to augmentation. Limited views of the contralateral common femoral vein are unremarkable. OTHER None. Limitations: none IMPRESSION: No evidence of right lower extremity DVT. Electronically Signed   By: Ranell Bring M.D.   On: 05/16/2024 11:14   CT ABDOMEN PELVIS W CONTRAST Result Date:  05/15/2024 CLINICAL DATA:  Abdominal pain, acute, nonlocalized.  Fever. EXAM: CT ABDOMEN AND PELVIS WITH CONTRAST TECHNIQUE: Multidetector CT imaging of the abdomen and pelvis was performed using the standard protocol following bolus administration of intravenous contrast. RADIATION DOSE REDUCTION: This exam was performed according to the departmental dose-optimization program which includes automated exposure control, adjustment of the mA and/or kV according to patient size and/or use of iterative reconstruction technique. CONTRAST:  OMNIPAQUE  IOHEXOL  300 MG/ML  SOLN COMPARISON:  05/01/2024. FINDINGS: Lower chest: Multivessel coronary artery calcifications are noted. Atelectasis is noted at the lung bases. Hepatobiliary: No focal abnormality in the liver. There is pneumobilia with a common bile duct stent in place. The stent appears patent. The gallbladder is surgically absent. Pancreas: The pancreatic duct is prominent  at 4 mm. No surrounding inflammatory changes are seen. Spleen: Normal in size without focal abnormality. Adrenals/Urinary Tract: The adrenal glands are within normal limits. The kidneys enhance symmetrically. There is a complex enhancing mass in the mid left kidney measuring up to 3.1 cm. A cyst is noted in the lower pole the left kidney. No renal calculus or hydronephrosis bilaterally. The bladder is unremarkable. Stomach/Bowel: The stomach is within normal limits. No bowel obstruction, free air, or pneumatosis. A moderate amount of retained stool is present in the colon. A few scattered diverticula are present along the colon without evidence of diverticulitis. Appendix is not seen. Vascular/Lymphatic: Aortic atherosclerosis. There is redemonstration of a possible calcified dissection flap versus penetrating ulcer in the distal abdominal aorta. No enlarged abdominal or pelvic lymph nodes. Reproductive: Status post hysterectomy. No adnexal masses. Other: No abdominopelvic ascites. Implanted  pump device is seen with in the anterior right abdomen Musculoskeletal: Degenerative changes are present in the thoracolumbar spine. There is severe spinal canal stenosis with associated compression deformity at the level L1 with retropulsed element, similar in appearance to the prior exam. Sclerosis of the superior endplate at L2 is unchanged. No new acute osseous abnormality is seen. IMPRESSION: 1. No acute intra-abdominal process. 2. Mild in overall large mint of complex enhancing mass in the left kidney, compatible with known renal cell carcinoma. 3. Pneumobilia with intrahepatic and extrahepatic biliary ductal dilatation and patent common bile duct stent in place. 4. Moderate amount of retained stool in the colon. 5. Stable compression deformity at L1 with retropulsed elements resulting in severe spinal canal stenosis. 6. Coronary artery calcifications and aortic atherosclerosis. 7. Remaining chronic findings as described above. Electronically Signed   By: Leita Birmingham M.D.   On: 05/15/2024 16:49   DG Chest Port 1 View Result Date: 05/15/2024 CLINICAL DATA:  Shortness of breath.  Abdominal pain and vomiting. EXAM: PORTABLE CHEST 1 VIEW COMPARISON:  12/31/2023. FINDINGS: The heart size and mediastinal contours are within normal limits. There is atherosclerotic calcification of the aorta. No consolidation, effusion, or pneumothorax is seen. No acute osseous abnormality. Surgical clips are identified in the right upper quadrant. IMPRESSION: No active disease. Electronically Signed   By: Leita Birmingham M.D.   On: 05/15/2024 15:49  [2 weeks]  Assessment:   78 y.o. year old female  with history of metastatic breast cancer to bone on Faslodex  and Xgeva  and has intrathecal pump, left renal mass concerning for cancer, cholangitis April and May 2025 with stent placement at [facility], duodenal ulcers April 2025 noted at the time of first ERCP with associated acquired duodenal stenosis, CAD, CKD, COPD, anxiety,  depression, PE, afib, on Eliquis , who presented to the emergency room due to persistent upper to mid abdominal pain, found to have likely pancreatitis on CT along with gastroduodenitis and/or PUD, and stool-filled colon.    Abnormal pancreas on imaging:  CT A/P with contrast demonstrated pancreatic ductal dilation with prominence of pancreatic head and mild stranding around the pancreatic head that may indicate pancreatitis.  Lipase only mildly elevated at 69 I do note that pancreatic duct dilation appears to be a newer finding as this was not demonstrated on MRCP in April or CT scans until 05/30/24.    Denies new medication, NSAIDs, ETOH. She has history of cholangitis with debris in her CBD, but has metal stent in place.  Alk phos is elevated, but has been slowly improving since placement of biliary stent, and bili has remained normal making pancreatitis less  likely to be a biliary issue, but unable to rule this out. Considering new pancreatic duct dilation, would consider MRCP to re-evaluate this area. Dr. Eartha communicated with ERCP endoscopist, Dr. Jinny who placed the stent and per Dr. Jinny who reached the [facility] [employer] rep, this stent is MRI compatible.    MR Abd with and without contrast completed this morning. 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 Subtle 1.3 X 1.6 cm mildly hypoenhancing area in pancreatic head on the left side of the main pancreatic duct which is indeterminate, likely needs EUS and FNA to differentiate. 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    Despite patient having abdominal pain which may be multifactorial in the setting of ?pancreatitis, PUD, and constipation, she denies postprandial symptoms and was requesting something to eat.    History of PUD:  Duodenal ulcers noted on ERCP in April 2024.  No biopsies were taken.  She is been on PPI twice daily since that time and  possibly Carafate , but unclear if she is taking as prescribed.  CT this admission with wall thickening in the pyloric region of the stomach and duodenum suggesting gastroduodenitis and/or PUD.  She will need repeat EGD to evaluate further, document ulcer healing, and biopsy to rule out H. pylori. EGD planned for this admission. Will need to hold Eliquis  x 48 hours prior. Received dose morning of 06/07/24. Plans for EGD Thursday.  Melena: It is notable that NTs reported two black stools yesterday evening, first one small (Bristol 7), second one large, (Bristol 1). Hgb overall stable. May be secondary to oral iron.    Constipation:  Chronic. Not adequately managed.  Could certainly be contributing to her chronic abdominal pain.  Previously improved with MiraLAX  twice daily, but has not been taking daily as outpatient.  Chronic normocytic anemia: chronic anemia since 2021, Hgb consistently in the 10-11 range until March/April 2025. In 01/2024, noted drop in Hgb into 9 range with further decline decline to 6.7 in mid April. Around that time she was noted to have oozing duodenal ulcers at time of ERCP. At discharge on 4/18, Hgb 7.8. Hgb consistently in the 8 range until 5/23, Hgb down to 6.8. No mention of duodenal ulcers or bleeding on 03/2024 ERCP. Patient had declined blood transfusion that admission but when presented back to ED on 6/1 with Hgb 6.6 she was transfused. On presentation this admission, Hgb 10.8 and has been stable. No previous colonoscopy. No iron/b12/folate deficiency on labs in April/May of this year.      Plan:   Continue full liquid diet.  Plans for EGD tomorrow to evaluate for persistent duodenal ulcers, persistent upper abdominal pain. She has had black stools documented this admission. Hgb stable. Off eliquis , last dose 7/8 am.  Hold Lovenox  for tomorrow morning.  Continue PPI BID. Continue miralax  17g BID. Monitor for overt GI bleeding. Trend H/H.  CA 19-9.  Suspect EUS with FNA  may be needed to determine etiology of pancreatic lesion if appropriate given multiple comorbidities.    LOS: 1 day   Sonny RAMAN. Ezzard RIGGERS [facility] [phone] 7/9/20252:10 PM

## 2024-06-08 NOTE — TOC Initial Note (Signed)
 Transition of Care [facility]) - Initial/Assessment Note    Patient Details  Name: Stacey [name] MRN: [id number] Date of Birth: 1946/07/19  [facility] CM/SW Contact:    Noreen KATHEE Pinal, LCSWA Phone Number: 06/08/2024, 11:26 AM  Clinical Narrative:                  Patient is at risk for readmission due to high admission score. Patient was admitted for Acute pancreatitis. CSW spoke with son who is POA. Son shared that he cares for all patient needs and that it has been about a month since [facility] has been out. Son shared that patient uses EMS for transportation since she is bedridden and the plans are for her to return back home. TOC to follow.   Expected Discharge Plan: Home/Self Care Barriers to Discharge: Continued Medical Work up   Patient Goals and CMS Choice Patient states their goals for this hospitalization and ongoing recovery are:: return back home CMS Medicare.gov Compare Post Acute Care list provided to:: Patient Represenative (must comment) (Son- [name])   [place] ownership interest in [facility].provided to:: Adult Children    Expected Discharge Plan and Services In-house Referral: Clinical Social Work   Post Acute Care Choice: Horticulturist, commercial, Home Health Living arrangements for the past 2 months: Single Family Home                                      Prior Living Arrangements/Services Living arrangements for the past 2 months: Single Family Home Lives with:: Adult Children Patient language and need for interpreter reviewed:: Yes Do you feel safe going back to the place where you live?: Yes      Need for Family Participation in Patient Care: Yes (Comment) Care giver support system in place?: Yes (comment) Current home services: DME Criminal Activity/Legal Involvement Pertinent to Current Situation/Hospitalization: No - Comment as needed  Activities of Daily Living   ADL Screening (condition at time of  admission) Independently performs ADLs?: No Does the patient have a NEW difficulty with bathing/dressing/toileting/self-feeding that is expected to last >3 days?: No Does the patient have a NEW difficulty with getting in/out of bed, walking, or climbing stairs that is expected to last >3 days?: No Does the patient have a NEW difficulty with communication that is expected to last >3 days?: No Is the patient deaf or have difficulty hearing?: No Does the patient have difficulty seeing, even when wearing glasses/contacts?: No Does the patient have difficulty concentrating, remembering, or making decisions?: No  Permission Sought/Granted      Share Information with NAME: [name]     Permission granted to share info w Relationship: Son     Emotional Assessment Appearance:: Appears stated age   Affect (typically observed): Accepting Orientation: : Oriented to Self, Oriented to Place, Oriented to  Time, Oriented to Situation Alcohol  / Substance Use: Not Applicable Psych Involvement: No (comment)  Admission diagnosis:  Acute pancreatitis [K85.90] Duodenitis [K29.80] Acute pancreatitis without infection or necrosis, unspecified pancreatitis type [K85.90] Patient Active Problem List   Diagnosis Date Noted   Acute pancreatitis 06/07/2024   Peptic ulcer disease 05/19/2024   Left renal mass 05/15/2024   Liver abscess 04/22/2024   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   Liver lesion 04/20/2024   Biliary obstruction 04/14/2024   Chronic kidney disease, stage 3b (HCC) 04/14/2024   Atrial fibrillation, chronic (HCC) 04/14/2024  Thrombocytopenia (HCC) 04/14/2024   Hyperkalemia 03/11/2024   Acute on chronic anemia 03/10/2024   UTI (urinary tract infection) 03/09/2024   AKI (acute kidney injury) (HCC) 03/09/2024   Obstructive jaundice 03/09/2024   Pyuria 03/09/2024   Hyponatremia 03/09/2024   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   Ascending cholangitis 03/09/2024   Acute metabolic encephalopathy 03/08/2024    Bronchospasm 03/24/2022   Acute bronchospasm 03/23/2022   Elevated brain natriuretic peptide (BNP) level 03/23/2022   Macrocytic anemia 03/23/2022   Incarcerated umbilical hernia    Cholecystitis    Jaundice    Cholecystitis, acute with cholelithiasis 03/25/2021   Abdominal pain 03/25/2021   Leukocytosis 03/25/2021   Hypokalemia 03/25/2021   Hyperglycemia 03/25/2021   Chronic diastolic CHF (congestive heart failure) (HCC) 03/25/2021   Obesity, Class II, BMI 35-39.9 03/25/2021   Choledocholithiasis    Rectal bleeding 08/23/2020   Supratherapeutic INR 08/23/2020   Left hip pain    Pain of metastatic malignancy 06/27/2020   Malignant neoplasm of right breast in female, estrogen receptor positive (HCC)    Goals of care, counseling/discussion    Advanced care planning/counseling discussion    Spinal stenosis of lumbar region    Metastasis to bone [facility])    Palliative care by specialist    Lumbar pain 04/24/2020   Lumbar radiculopathy 02/16/2020   Palpitations 11/16/2019   Dysuria    Acute bilateral low back pain 10/02/2019   Bipolar 2 disorder, major depressive episode (HCC) 09/01/2019   Adjustment disorder with depressed mood 06/30/2019   Atrial fibrillation with rapid ventricular response (HCC) 11/14/2018   Fall    Obesity, Class III, BMI 40-49.9 (morbid obesity)    Chronic respiratory failure with hypoxia (HCC)    Transaminitis 10/07/2018   PSVT (paroxysmal supraventricular tachycardia) (HCC) 10/07/2018   HLD (hyperlipidemia) 10/07/2018   COPD (chronic obstructive pulmonary disease) (HCC) 10/07/2018   CAD in native artery 10/07/2018   Chronic pain 10/07/2018   Acute back pain 10/07/2018   Acute hypoxemic respiratory failure (HCC) 09/19/2018   Acute on chronic combined systolic and diastolic CHF (congestive heart failure) (HCC) 09/19/2018   COPD with acute exacerbation (HCC) 09/19/2018   Dyspnea 11/30/2017   Non-ST elevation (NSTEMI) myocardial infarction (HCC)    HTN  (hypertension) 11/23/2017   Left bundle branch block 11/23/2017   Acute on chronic diastolic CHF (congestive heart failure) (HCC) 11/23/2017   CAD (coronary artery disease) 11/23/2017   Primary malignant neoplasm of breast with metastasis (HCC) 11/23/2017   Acute respiratory failure with hypoxia (HCC) 11/23/2017   Atypical nevus 07/29/2016   Age-related nuclear cataract, bilateral 02/12/2016   Anxiety and depression 01/24/2016   Arthropathy, lower leg 09/14/2013   Tibialis tendinitis 02/22/2013   Plantar fasciitis 01/17/2013   PCP:  Rosamond Leta NOVAK, MD Pharmacy:   [facility] [address] [place] - [address]  [place] [address] Phone: [phone] Fax: [phone]  [facility] - [place], [place] - [address] [place] [address] Phone: [phone] Fax: [phone]     Social Drivers of Health (SDOH) Social History: SDOH Screenings   Food Insecurity: No Food Insecurity (06/07/2024)  Housing: Low Risk  (06/07/2024)  Transportation Needs: No Transportation Needs (06/07/2024)  Utilities: Not At Risk (06/07/2024)  Financial Resource Strain: High Risk (01/04/2024)   Received from [facility]  Physical Activity: Inactive (08/17/2023)   Received from [facility]  Social Connections: Unknown (06/07/2024)  Recent Concern: Social  Connections - Socially Isolated (03/09/2024)  Stress: No Stress Concern Present (08/17/2023)   Received from [facility]  Tobacco Use: Medium Risk (06/06/2024)  Health Literacy: Inadequate Health Literacy (08/17/2023)   Received from [facility]   SDOH Interventions:     Readmission Risk Interventions    06/08/2024   11:19 AM 05/20/2024    9:15 AM 05/19/2024    2:01 PM  Readmission Risk Prevention Plan  Transportation Screening Complete Complete Complete  Medication Review [employer]) Complete Complete  Complete  HRI or Home Care Consult Complete Complete Complete  SW Recovery Care/Counseling Consult Complete Complete Complete  Palliative Care Screening Not Applicable Not Applicable Not Applicable  Skilled Nursing Facility Not Applicable Not Applicable Not Applicable

## 2024-06-08 NOTE — Progress Notes (Signed)
 PROGRESS NOTE    Stacey  LOLAH [name]  FMW:[id number] DOB: 1946-03-14 DOA: 06/06/2024 PCP: Rosamond Leta KATHEE, MD   Brief Narrative:    Stacey [name] is a 78 y.o. female with medical history significant for biliary stricture status post stenting, ascending cholangitis, breast cancer metastasized to bone-follows at [facility], pathological L1 fracture, chronic pain with intrathecal pump (morphine /bupivacaine ), PE, atrial fibrillation, CAD with prior stenting, CKD stage IIIb, left-sided renal mass, COPD, anxiety, and depression who presented to the ED with generalized abdominal pain.  Patient admitted for possible acute pancreatitis and has been seen by GI given complex history and is noted to possibly have some pain related to peptic ulcer disease and there is concern for possible pancreatic malignancy that will require MRCP.  GI plans for upper endoscopy 7/10 after Eliquis  washout.  Assessment & Plan:   Principal Problem:   Acute pancreatitis Active Problems:   Atrial fibrillation, chronic (HCC)   HLD (hyperlipidemia)   COPD (chronic obstructive pulmonary disease) (HCC)   HTN (hypertension)   Anxiety and depression   Metastasis to bone (HCC)   Malignant neoplasm of right breast in female, estrogen receptor positive (HCC)   Liver lesion   Biliary stricture  Assessment and Plan:   Acute pancreatitis versus other causes such as PUD - History of recent biliary stricture and ascending cholangitis status post stent placement and status post stent exchange -Admissions earlier this year for cholangitis -Currently full liquid diet and n.p.o. after midnight for EGD in a.m. 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   Consultants:  GI  Procedures:  None  Antimicrobials:  None   Subjective: Patient seen and evaluated today with no new acute complaints or concerns. No acute concerns or events noted overnight.  She is overall feeling better this morning with less pain and no nausea or vomiting.  Objective: Vitals:   06/07/24 1438 06/07/24 1645 06/07/24 2208 06/08/24 0354  BP: (!) 173/83 (!) 181/93 (!) 173/83 123/67  Pulse: 90 99 98 96  Resp: 18 18 18 12   Temp: 97.9 F (36.6 C) 98.1 F (36.7 C) 98 F (36.7 C) 97.9 F (36.6 C)  TempSrc: Oral Oral Oral Axillary  SpO2: 99% 100% 99% 92%  Weight:      Height:        Intake/Output Summary (Last 24 hours) at 06/08/2024 1104 Last data filed at 06/08/2024 0900 Gross per 24 hour  Intake 1176.6 ml  Output 1000 ml  Net 176.6 ml   Filed Weights   06/06/24 1728  Weight: 79 kg    Examination:  General exam: Appears calm and comfortable  Respiratory system: Clear to auscultation. Respiratory effort normal. Cardiovascular system: S1 & S2 heard, RRR.  Gastrointestinal system: Abdomen is soft Central nervous system: Alert and awake Extremities: No  edema Skin: No significant lesions noted Psychiatry: Flat affect.    Data Reviewed: I have personally reviewed following labs and imaging studies  CBC: Recent Labs  Lab 06/02/24 1001 06/06/24 1845 06/08/24 0446  WBC 4.2 4.2 4.4  NEUTROABS 2.6  --   --   HGB 9.8* 10.3* 11.3*  HCT 31.2* 32.1* 35.1*  MCV 109.9* 106.6* 106.4*  PLT 207 246 204   Basic Metabolic Panel: Recent Labs  Lab 06/02/24 1001 06/06/24 2001 06/08/24 0446  NA 136 140 139  K 3.6 4.1 3.8  CL 102 104 103  CO2 26 28 27   GLUCOSE 134* 106* 102*  BUN 13 12 9   CREATININE 0.56 0.51 0.46  CALCIUM  8.1* 8.6* 8.5*  MG  --   --  1.8   GFR: Estimated Creatinine Clearance: 54.8 mL/min (by C-G formula based on SCr of 0.46 mg/dL). Liver Function Tests: Recent Labs  Lab 06/02/24 1001 06/06/24 2001 06/08/24 0446  AST 50* 48* 44*  ALT 22 22 21   ALKPHOS 453* 402* 384*  BILITOT 0.7 0.8 0.9  PROT 6.1* 6.3* 6.0*  ALBUMIN  2.2* 2.4* 2.2*   Recent Labs  Lab 06/06/24 2001  LIPASE 69*   No results for input(s): AMMONIA in the last 168 hours. Coagulation Profile: No results for input(s): INR, PROTIME in the last 168 hours. Cardiac Enzymes: No results for input(s): CKTOTAL, CKMB, CKMBINDEX, TROPONINI in the last 168 hours. BNP (last 3 results) No results for input(s): PROBNP in the last 8760 hours. HbA1C: No results for input(s): HGBA1C in the last 72 hours. CBG: No results for input(s): GLUCAP in the last 168 hours. Lipid Profile: No results for input(s): CHOL, HDL, LDLCALC, TRIG, CHOLHDL, LDLDIRECT in the last 72 hours. Thyroid  Function Tests: No results for input(s): TSH, T4TOTAL, FREET4, T3FREE, THYROIDAB in the last 72 hours. Anemia Panel: No results for input(s): VITAMINB12, FOLATE, FERRITIN, TIBC, IRON, RETICCTPCT in the last 72 hours. Sepsis Labs: Recent Labs  Lab 06/06/24 1906  LATICACIDVEN 0.7    No results found for this or any previous  visit (from the past 240 hours).       Radiology Studies: MR ABDOMEN MRCP W WO CONTAST Result Date: 06/08/2024 CLINICAL DATA:  pancreatitis, pancreatic duct dilation, history of cholangitis with current biliary stent in place; 855384 Pain 144615 pancreatitis,. EXAM: MRI ABDOMEN WITHOUT AND WITH CONTRAST (INCLUDING MRCP) TECHNIQUE: Multiplanar multisequence MR imaging of the abdomen was performed both before and after the administration of intravenous contrast. Heavily T2-weighted images of the biliary and pancreatic ducts were obtained, and three-dimensional MRCP images were rendered by post processing. CONTRAST:  7mL GADAVIST  GADOBUTROL  1 MMOL/ML IV SOLN COMPARISON:  CT scan abdomen and pelvis from 06/07/2024. FINDINGS: Lower chest: Unremarkable MR appearance to the lung bases. No pleural effusion. No pericardial effusion. Normal heart size. Hepatobiliary: The liver is normal in size and configuration. No intrahepatic bile duct dilation. There is susceptibility artifacts in the extrahepatic bile duct from metallic stent. There is pneumobilia mainly in the left lobe ducts. The gallbladder is surgically absent. There are susceptibility artifacts in the gallbladder fossa region, from cholecystectomy clips. Pancreas:  Redemonstration of diffuse a trophy of the pancreas with resultant dilated main pancreatic duct measuring up to 6-7 mm in the neck/body region. There is subtle mildly hypoenhancing area measuring at least 1.3 x 1.6 cm in the pancreatic head on the left side of the main pancreatic duct. There is associated after up cutoff of the main pancreatic duct. The area appears slightly T2 hyperintense. It is not well evaluated on the diffusion-weighted images due to extensive artifacts. This is indeterminate in etiology and differential diagnosis includes focal pancreatic adenocarcinoma versus mass forming chronic pancreatitis. EUS and FNA is recommended for differentiation. No peripancreatic fat stranding or  peripancreatic walled-off collections. There is trace amount of fluid in the left paracolic gutter, likely sequela of pancreatitis. Spleen:  Within normal limits in size and appearance. No focal mass. Adrenals/Urinary Tract: Unremarkable adrenal glands. Redemonstration of previously seen heterogeneously enhancing 2.5 x 2.8 x 3.1 cm mass arising from the left kidney upper pole/interpolar region, anterolaterally. There is a simple cyst arising from the left kidney lower pole, posteriorly measuring up to 1.3 x 1.4 cm. Bilateral kidneys are otherwise essentially unremarkable. No hydronephrosis. Right extrarenal pelvis noted. Stomach/Bowel: Visualized portions within the abdomen are unremarkable. No disproportionate dilation of bowel loops. Vascular/Lymphatic: No pathologically enlarged lymph nodes identified. No abdominal aortic aneurysm demonstrated. No ascites. Other:  None. Musculoskeletal: No suspicious bone lesions identified. There is moderate compression deformity of L1 vertebrae, better evaluated on the CT scan exam. IMPRESSION: 1. There is subtle mildly hypoenhancing area in the pancreatic head region, which is indeterminate in etiology and differential diagnosis includes focal pancreatic adenocarcinoma versus mass forming chronic pancreatitis. Correlation with EUS and FNA is recommended for differentiation. 2. Redemonstration of previously seen heterogeneously enhancing 2.5 x 2.8 x 3.1 cm mass arising from the left kidney upper pole/interpolar region, anterolaterally, compatible with renal cell carcinoma. 3. No metastatic disease identified within the abdomen. Electronically Signed   By: Ree Molt M.D.   On: 06/08/2024 09:43   MR 3D Recon At Scanner Result Date: 06/08/2024 CLINICAL DATA:  pancreatitis, pancreatic duct dilation, history of cholangitis with current biliary stent in place; 855384 Pain 144615 pancreatitis,. EXAM: MRI ABDOMEN WITHOUT AND WITH CONTRAST (INCLUDING MRCP) TECHNIQUE: Multiplanar  multisequence MR imaging of the abdomen was performed both before and after the administration of intravenous contrast. Heavily T2-weighted images of the biliary and pancreatic ducts were obtained, and three-dimensional MRCP images were rendered by post processing. CONTRAST:  7mL GADAVIST  GADOBUTROL  1 MMOL/ML IV SOLN COMPARISON:  CT scan abdomen and pelvis from 06/07/2024. FINDINGS: Lower chest: Unremarkable MR appearance to the lung bases. No pleural effusion. No pericardial effusion. Normal heart size. Hepatobiliary: The liver is normal in size and configuration. No intrahepatic bile duct dilation. There is susceptibility artifacts in the extrahepatic bile duct from metallic stent. There is pneumobilia mainly in the left lobe ducts. The gallbladder is surgically absent. There are susceptibility artifacts in the gallbladder fossa region, from cholecystectomy clips. Pancreas: Redemonstration of diffuse a trophy of the pancreas with resultant dilated main pancreatic duct measuring up to 6-7 mm in the neck/body region. There is subtle mildly hypoenhancing area measuring at least 1.3 x 1.6 cm in the pancreatic head on the left side of the main pancreatic duct. There is associated after up cutoff of the main pancreatic duct. The area appears slightly T2 hyperintense. It is not well evaluated on the diffusion-weighted images due to extensive artifacts. This is indeterminate in etiology and differential diagnosis includes focal pancreatic adenocarcinoma versus  mass forming chronic pancreatitis. EUS and FNA is recommended for differentiation. No peripancreatic fat stranding or peripancreatic walled-off collections. There is trace amount of fluid in the left paracolic gutter, likely sequela of pancreatitis. Spleen:  Within normal limits in size and appearance. No focal mass. Adrenals/Urinary Tract: Unremarkable adrenal glands. Redemonstration of previously seen heterogeneously enhancing 2.5 x 2.8 x 3.1 cm mass arising from  the left kidney upper pole/interpolar region, anterolaterally. There is a simple cyst arising from the left kidney lower pole, posteriorly measuring up to 1.3 x 1.4 cm. Bilateral kidneys are otherwise essentially unremarkable. No hydronephrosis. Right extrarenal pelvis noted. Stomach/Bowel: Visualized portions within the abdomen are unremarkable. No disproportionate dilation of bowel loops. Vascular/Lymphatic: No pathologically enlarged lymph nodes identified. No abdominal aortic aneurysm demonstrated. No ascites. Other:  None. Musculoskeletal: No suspicious bone lesions identified. There is moderate compression deformity of L1 vertebrae, better evaluated on the CT scan exam. IMPRESSION: 1. There is subtle mildly hypoenhancing area in the pancreatic head region, which is indeterminate in etiology and differential diagnosis includes focal pancreatic adenocarcinoma versus mass forming chronic pancreatitis. Correlation with EUS and FNA is recommended for differentiation. 2. Redemonstration of previously seen heterogeneously enhancing 2.5 x 2.8 x 3.1 cm mass arising from the left kidney upper pole/interpolar region, anterolaterally, compatible with renal cell carcinoma. 3. No metastatic disease identified within the abdomen. Electronically Signed   By: Ree Molt M.D.   On: 06/08/2024 09:43   CT ABDOMEN PELVIS W CONTRAST Result Date: 06/07/2024 CLINICAL DATA:  Acute nonlocalized abdominal pain. History of gastric ulcers. EXAM: CT ABDOMEN AND PELVIS WITH CONTRAST TECHNIQUE: Multidetector CT imaging of the abdomen and pelvis was performed using the standard protocol following bolus administration of intravenous contrast. RADIATION DOSE REDUCTION: This exam was performed according to the departmental dose-optimization program which includes automated exposure control, adjustment of the mA and/or kV according to patient size and/or use of iterative reconstruction technique. CONTRAST:  OMNIPAQUE  IOHEXOL  300 MG/ML   SOLN COMPARISON:  05/30/2024 FINDINGS: Lower chest: Lung bases are clear. Hepatobiliary: Biliary stent in place. Pneumobilia consistent with stent placement. Surgical absence of the gallbladder. No focal liver lesions. Similar appearance to prior study. Pancreas: Diffuse pancreatic ductal dilatation. Mild infiltration in the region of the head of the pancreas. Small amount of fluid in the anterior pararenal fascia. Changes may indicate acute pancreatitis. Mild progression since prior study. Spleen: Normal in size without focal abnormality. Adrenals/Urinary Tract: Heterogeneously enhancing mass in the left kidney measuring 3.1 cm diameter. Similar appearance to previous study. The appearance is most consistent with renal cell carcinoma. No hydronephrosis or hydroureter. Bladder is decompressed. Stomach/Bowel: Stomach, small bowel, and colon are not abnormally distended. Wall thickening the pyloric region of the stomach and in the duodenum possibly representing gastritis/peptic ulcer disease with duodenitis. Stool-filled colon. Appendix is not identified. Vascular/Lymphatic: Aortic atherosclerosis. No enlarged abdominal or pelvic lymph nodes. Reproductive: Status post hysterectomy. No adnexal masses. Other: No free air or free fluid in the abdomen. Abdominal wall musculature appears intact. Fatty atrophy of the abdominal musculature. Implant in the anterior subcutaneous fat over the right lower quadrant probably medication portal. Musculoskeletal: Compression deformity of L2 unchanged since prior study. Severe degenerative changes at L1-2 and L2-3 levels. No acute bony abnormalities. IMPRESSION: 1. Pancreatic ductal dilatation with prominence of the pancreatic head and mild stranding around the pancreatic head. This may indicate pancreatitis and is progressed since prior study. 2. Wall thickening in the pyloric region of the stomach and in the duodenum  suggesting gastric duodenitis and/or peptic ulcer disease. 3.  Pneumobilia due to biliary duct stent. 4. Heterogeneously enhancing mass in the left kidney is unchanged since prior study. Appearance is most consistent with renal cell carcinoma. 5. Aortic atherosclerosis. Electronically Signed   By: Elsie Gravely M.D.   On: 06/07/2024 00:16        Scheduled Meds:  DULoxetine   30 mg Oral BID   enoxaparin  (LOVENOX ) injection  40 mg Subcutaneous Q24H   ferrous sulfate   325 mg Oral Q breakfast   metoprolol  succinate  75 mg Oral Daily   pantoprazole   40 mg Oral BID   polyethylene glycol  17 g Oral BID   senna-docusate  2 tablet Oral QHS      LOS: 1 day    Time spent: 55 minutes    Stacey Gimpel JONETTA Fairly, DO [facility]  If 7PM-7AM, please contact night-coverage [email or web address] 06/08/2024, 11:04 AM

## 2024-06-08 NOTE — Plan of Care (Signed)

## 2024-06-08 NOTE — H&P (View-Only) (Signed)
 Gastroenterology Progress Note   Referring Provider: No ref. provider found Primary Care Physician:  Rosamond Leta NOVAK, MD Primary Gastroenterologist:  Toribio Fortune, MD Patient ID: Stacey [name]; [id number]; [birth year]   Subjective:    Doing ok. Some persistent upper abdominal pain. No n/v. Does not really like the grits. Has a large BM this morning. She is not sure of color, nurse tech assisted her.   Objective:   Vital signs in last 24 hours: Temp:  [97.9 F (36.6 C)-98.1 F (36.7 C)] 98.1 F (36.7 C) (07/09 1300) Pulse Rate:  [78-99] 78 (07/09 1300) Resp:  [12-18] 15 (07/09 1300) BP: (121-181)/(64-93) 121/64 (07/09 1300) SpO2:  [92 %-100 %] 96 % (07/09 1300) Last BM Date : 06/07/24 General:   Alert,  Well-developed, well-nourished, pleasant and cooperative in NAD. Sister at bedside. Head:  Normocephalic and atraumatic. Eyes:  Sclera clear, no icterus.   Abdomen:  Soft, nondistended. Exam limited by body habitus. Mild epigastric tenderness. Normal bowel sounds, without guarding, and without rebound.  Pump noted rlq. Extremities:  Without clubbing, deformity or edema. Neurologic:  Alert and  oriented x4;  grossly normal neurologically. Skin:  Intact without significant lesions or rashes. Psych:  Alert and cooperative. Normal mood and affect.  Intake/Output from previous day: 07/08 0701 - 07/09 0700 In: 1176.6 [I.V.:1176.6] Out: 600 [Urine:600] Intake/Output this shift: Total I/O In: -  Out: 400 [Urine:400]  Lab Results: CBC Recent Labs    06/06/24 1845 06/08/24 0446  WBC 4.2 4.4  HGB 10.3* 11.3*  HCT 32.1* 35.1*  MCV 106.6* 106.4*  PLT 246 204   BMET Recent Labs    06/06/24 2001 06/08/24 0446  NA 140 139  K 4.1 3.8  CL 104 103  CO2 28 27  GLUCOSE 106* 102*  BUN 12 9  CREATININE 0.51 0.46  CALCIUM  8.6* 8.5*   LFTs Recent Labs    06/06/24 2001 06/08/24 0446  BILITOT 0.8 0.9  ALKPHOS 402* 384*  AST 48* 44*  ALT 22 21  PROT 6.3* 6.0*   ALBUMIN  2.4* 2.2*   Recent Labs    06/06/24 2001  LIPASE 69*   PT/INR No results for input(s): LABPROT, INR in the last 72 hours.       Imaging Studies: MR ABDOMEN MRCP W WO CONTAST Result Date: 06/08/2024 CLINICAL DATA:  pancreatitis, pancreatic duct dilation, history of cholangitis with current biliary stent in place; 855384 Pain 144615 pancreatitis,. EXAM: MRI ABDOMEN WITHOUT AND WITH CONTRAST (INCLUDING MRCP) TECHNIQUE: Multiplanar multisequence MR imaging of the abdomen was performed both before and after the administration of intravenous contrast. Heavily T2-weighted images of the biliary and pancreatic ducts were obtained, and three-dimensional MRCP images were rendered by post processing. CONTRAST:  7mL GADAVIST  GADOBUTROL  1 MMOL/ML IV SOLN COMPARISON:  CT scan abdomen and pelvis from 06/07/2024. FINDINGS: Lower chest: Unremarkable MR appearance to the lung bases. No pleural effusion. No pericardial effusion. Normal heart size. Hepatobiliary: The liver is normal in size and configuration. No intrahepatic bile duct dilation. There is susceptibility artifacts in the extrahepatic bile duct from metallic stent. There is pneumobilia mainly in the left lobe ducts. The gallbladder is surgically absent. There are susceptibility artifacts in the gallbladder fossa region, from cholecystectomy clips. Pancreas: Redemonstration of diffuse a trophy of the pancreas with resultant dilated main pancreatic duct measuring up to 6-7 mm in the neck/body region. There is subtle mildly hypoenhancing area measuring at least 1.3 x 1.6 cm in the pancreatic head on the left  side of the main pancreatic duct. There is associated after up cutoff of the main pancreatic duct. The area appears slightly T2 hyperintense. It is not well evaluated on the diffusion-weighted images due to extensive artifacts. This is indeterminate in etiology and differential diagnosis includes focal pancreatic adenocarcinoma versus mass  forming chronic pancreatitis. EUS and FNA is recommended for differentiation. No peripancreatic fat stranding or peripancreatic walled-off collections. There is trace amount of fluid in the left paracolic gutter, likely sequela of pancreatitis. Spleen:  Within normal limits in size and appearance. No focal mass. Adrenals/Urinary Tract: Unremarkable adrenal glands. Redemonstration of previously seen heterogeneously enhancing 2.5 x 2.8 x 3.1 cm mass arising from the left kidney upper pole/interpolar region, anterolaterally. There is a simple cyst arising from the left kidney lower pole, posteriorly measuring up to 1.3 x 1.4 cm. Bilateral kidneys are otherwise essentially unremarkable. No hydronephrosis. Right extrarenal pelvis noted. Stomach/Bowel: Visualized portions within the abdomen are unremarkable. No disproportionate dilation of bowel loops. Vascular/Lymphatic: No pathologically enlarged lymph nodes identified. No abdominal aortic aneurysm demonstrated. No ascites. Other:  None. Musculoskeletal: No suspicious bone lesions identified. There is moderate compression deformity of L1 vertebrae, better evaluated on the CT scan exam. IMPRESSION: 1. There is subtle mildly hypoenhancing area in the pancreatic head region, which is indeterminate in etiology and differential diagnosis includes focal pancreatic adenocarcinoma versus mass forming chronic pancreatitis. Correlation with EUS and FNA is recommended for differentiation. 2. Redemonstration of previously seen heterogeneously enhancing 2.5 x 2.8 x 3.1 cm mass arising from the left kidney upper pole/interpolar region, anterolaterally, compatible with renal cell carcinoma. 3. No metastatic disease identified within the abdomen. Electronically Signed   By: Ree Molt M.D.   On: 06/08/2024 09:43   MR 3D Recon At Scanner Result Date: 06/08/2024 CLINICAL DATA:  pancreatitis, pancreatic duct dilation, history of cholangitis with current biliary stent in place;  [id number] Pain 144615 pancreatitis,. EXAM: MRI ABDOMEN WITHOUT AND WITH CONTRAST (INCLUDING MRCP) TECHNIQUE: Multiplanar multisequence MR imaging of the abdomen was performed both before and after the administration of intravenous contrast. Heavily T2-weighted images of the biliary and pancreatic ducts were obtained, and three-dimensional MRCP images were rendered by post processing. CONTRAST:  7mL GADAVIST  GADOBUTROL  1 MMOL/ML IV SOLN COMPARISON:  CT scan abdomen and pelvis from 06/07/2024. FINDINGS: Lower chest: Unremarkable MR appearance to the lung bases. No pleural effusion. No pericardial effusion. Normal heart size. Hepatobiliary: The liver is normal in size and configuration. No intrahepatic bile duct dilation. There is susceptibility artifacts in the extrahepatic bile duct from metallic stent. There is pneumobilia mainly in the left lobe ducts. The gallbladder is surgically absent. There are susceptibility artifacts in the gallbladder fossa region, from cholecystectomy clips. Pancreas: Redemonstration of diffuse a trophy of the pancreas with resultant dilated main pancreatic duct measuring up to 6-7 mm in the neck/body region. There is subtle mildly hypoenhancing area measuring at least 1.3 x 1.6 cm in the pancreatic head on the left side of the main pancreatic duct. There is associated after up cutoff of the main pancreatic duct. The area appears slightly T2 hyperintense. It is not well evaluated on the diffusion-weighted images due to extensive artifacts. This is indeterminate in etiology and differential diagnosis includes focal pancreatic adenocarcinoma versus mass forming chronic pancreatitis. EUS and FNA is recommended for differentiation. No peripancreatic fat stranding or peripancreatic walled-off collections. There is trace amount of fluid in the left paracolic gutter, likely sequela of pancreatitis. Spleen:  Within normal limits in size and appearance.  No focal mass. Adrenals/Urinary Tract:  Unremarkable adrenal glands. Redemonstration of previously seen heterogeneously enhancing 2.5 x 2.8 x 3.1 cm mass arising from the left kidney upper pole/interpolar region, anterolaterally. There is a simple cyst arising from the left kidney lower pole, posteriorly measuring up to 1.3 x 1.4 cm. Bilateral kidneys are otherwise essentially unremarkable. No hydronephrosis. Right extrarenal pelvis noted. Stomach/Bowel: Visualized portions within the abdomen are unremarkable. No disproportionate dilation of bowel loops. Vascular/Lymphatic: No pathologically enlarged lymph nodes identified. No abdominal aortic aneurysm demonstrated. No ascites. Other:  None. Musculoskeletal: No suspicious bone lesions identified. There is moderate compression deformity of L1 vertebrae, better evaluated on the CT scan exam. IMPRESSION: 1. There is subtle mildly hypoenhancing area in the pancreatic head region, which is indeterminate in etiology and differential diagnosis includes focal pancreatic adenocarcinoma versus mass forming chronic pancreatitis. Correlation with EUS and FNA is recommended for differentiation. 2. Redemonstration of previously seen heterogeneously enhancing 2.5 x 2.8 x 3.1 cm mass arising from the left kidney upper pole/interpolar region, anterolaterally, compatible with renal cell carcinoma. 3. No metastatic disease identified within the abdomen. Electronically Signed   By: Ree Molt M.D.   On: 06/08/2024 09:43   CT ABDOMEN PELVIS W CONTRAST Result Date: 06/07/2024 CLINICAL DATA:  Acute nonlocalized abdominal pain. History of gastric ulcers. EXAM: CT ABDOMEN AND PELVIS WITH CONTRAST TECHNIQUE: Multidetector CT imaging of the abdomen and pelvis was performed using the standard protocol following bolus administration of intravenous contrast. RADIATION DOSE REDUCTION: This exam was performed according to the departmental dose-optimization program which includes automated exposure control, adjustment of the mA  and/or kV according to patient size and/or use of iterative reconstruction technique. CONTRAST:  OMNIPAQUE  IOHEXOL  300 MG/ML  SOLN COMPARISON:  05/30/2024 FINDINGS: Lower chest: Lung bases are clear. Hepatobiliary: Biliary stent in place. Pneumobilia consistent with stent placement. Surgical absence of the gallbladder. No focal liver lesions. Similar appearance to prior study. Pancreas: Diffuse pancreatic ductal dilatation. Mild infiltration in the region of the head of the pancreas. Small amount of fluid in the anterior pararenal fascia. Changes may indicate acute pancreatitis. Mild progression since prior study. Spleen: Normal in size without focal abnormality. Adrenals/Urinary Tract: Heterogeneously enhancing mass in the left kidney measuring 3.1 cm diameter. Similar appearance to previous study. The appearance is most consistent with renal cell carcinoma. No hydronephrosis or hydroureter. Bladder is decompressed. Stomach/Bowel: Stomach, small bowel, and colon are not abnormally distended. Wall thickening the pyloric region of the stomach and in the duodenum possibly representing gastritis/peptic ulcer disease with duodenitis. Stool-filled colon. Appendix is not identified. Vascular/Lymphatic: Aortic atherosclerosis. No enlarged abdominal or pelvic lymph nodes. Reproductive: Status post hysterectomy. No adnexal masses. Other: No free air or free fluid in the abdomen. Abdominal wall musculature appears intact. Fatty atrophy of the abdominal musculature. Implant in the anterior subcutaneous fat over the right lower quadrant probably medication portal. Musculoskeletal: Compression deformity of L2 unchanged since prior study. Severe degenerative changes at L1-2 and L2-3 levels. No acute bony abnormalities. IMPRESSION: 1. Pancreatic ductal dilatation with prominence of the pancreatic head and mild stranding around the pancreatic head. This may indicate pancreatitis and is progressed since prior study. 2. Wall  thickening in the pyloric region of the stomach and in the duodenum suggesting gastric duodenitis and/or peptic ulcer disease. 3. Pneumobilia due to biliary duct stent. 4. Heterogeneously enhancing mass in the left kidney is unchanged since prior study. Appearance is most consistent with renal cell carcinoma. 5. Aortic atherosclerosis. Electronically Signed  By: Elsie Gravely M.D.   On: 06/07/2024 00:16   CT ABDOMEN PELVIS W CONTRAST Result Date: 05/30/2024 CLINICAL DATA:  Abdominal pain since this morning EXAM: CT ABDOMEN AND PELVIS WITH CONTRAST TECHNIQUE: Multidetector CT imaging of the abdomen and pelvis was performed using the standard protocol following bolus administration of intravenous contrast. RADIATION DOSE REDUCTION: This exam was performed according to the departmental dose-optimization program which includes automated exposure control, adjustment of the mA and/or kV according to patient size and/or use of iterative reconstruction technique. CONTRAST:  OMNIPAQUE  IOHEXOL  300 MG/ML  SOLN COMPARISON:  Same day abdominal radiographs and CT 05/18/2024 FINDINGS: Lower chest: No acute abnormality. Hepatobiliary: Unchanged biliary stent and pneumobilia. Cholecystectomy. No focal hepatic lesion. Pancreas: Prominent pancreatic duct measuring up to 6 mm. This may have slightly increased compared to 05/18/2024. Trace stranding about the pancreatic head. Correlate with lipase for acute pancreatitis. Spleen: Unremarkable. Adrenals/Urinary Tract: Normal adrenal glands. Unchanged enhancing heterogenous neoplasm in the left kidney measuring 3.0 cm. No urinary calculi or hydronephrosis. Nondistended bladder. Stomach/Bowel: Normal caliber large and small bowel. Moderate colonic stool burden. Stomach is within normal limits. The appendix is not visualized. Vascular/Lymphatic: Advanced aortic atherosclerotic calcification. No lymphadenopathy. Reproductive: Hysterectomy.  No adnexal mass. Other: Trace free  fluid in the pelvis.  No free intraperitoneal air. Musculoskeletal: No acute fracture. Chronic compression fracture of L1 with retropulsion. IMPRESSION: 1. Increased dilation of the main pancreatic duct now measuring 6 mm. Trace stranding about the pancreatic head. Correlate with lipase for acute pancreatitis. 2. Unchanged biliary stent and pneumobilia. 3. Unchanged 3.0 cm left renal mass compatible with renal cell carcinoma. 4. Severe compression deformity of L1 with retropulsion resulting in severe spinal canal stenosis, unchanged. 5. Aortic Atherosclerosis (ICD10-I70.0). Electronically Signed   By: Norman Gatlin M.D.   On: 05/30/2024 02:45   DG Abdomen 1 View Result Date: 05/30/2024 CLINICAL DATA:  Abdominal pain onset this morning. EXAM: ABDOMEN - 1 VIEW COMPARISON:  CT with IV contrast 05/18/2024. FINDINGS: Nonobstructive bowel pattern with moderate fecal stasis. Right upper quadrant surgical clips and a common bile duct stent are again noted as well as an implanted pump device on the right. There is no supine evidence of free air or other acute radiographic findings. There is a severe chronic compression fracture of the L1 vertebral body, levoscoliosis and advanced degenerative change of the lumbar spine. Lung bases are clear. No new abnormality. IMPRESSION: 1. Nonobstructive bowel pattern with moderate fecal stasis. 2. No other acute radiographic findings. 3. Chronic findings as above. Electronically Signed   By: Francis Quam M.D.   On: 05/30/2024 01:10   DG Chest Port 1 View Result Date: 05/29/2024 CLINICAL DATA:  Upper abdominal pain EXAM: PORTABLE CHEST 1 VIEW COMPARISON:  Chest radiograph 05/15/2024 FINDINGS: Low lung volumes. Stable cardiomediastinal silhouette. Aortic atherosclerotic calcification. No focal consolidation, pleural effusion, or pneumothorax. No displaced rib fractures. IMPRESSION: No active disease. Electronically Signed   By: Norman Gatlin M.D.   On: 05/29/2024 23:05   CT  ABDOMEN PELVIS WO CONTRAST Result Date: 05/18/2024 CLINICAL DATA:  Worsening abdominal pain. EXAM: CT ABDOMEN AND PELVIS WITHOUT CONTRAST TECHNIQUE: Multidetector CT imaging of the abdomen and pelvis was performed following the standard protocol without IV contrast. RADIATION DOSE REDUCTION: This exam was performed according to the departmental dose-optimization program which includes automated exposure control, adjustment of the mA and/or kV according to patient size and/or use of iterative reconstruction technique. COMPARISON:  05/15/2024. FINDINGS: Lower chest: No acute abnormality. Multivessel  coronary artery calcifications. Hepatobiliary: Persistent unchanged pneumobilia with common bile duct stent in place. Stent appears patent. No focal liver abnormality is seen. Gallbladder is surgically absent. Pancreas: Similar prominence of the pancreatic duct measuring up to 4 mm. No surrounding inflammatory changes. Spleen: Unremarkable. Adrenals/Urinary Tract: Adrenal glands are unremarkable. Unchanged complex mass in the mid left kidney, better delineated and measuring up to 3.1 cm on the prior contrast-enhanced CT dated 05/15/2024. No urolithiasis or hydronephrosis. Bladder is unremarkable. Stomach/Bowel: Stomach is within normal limits. No evidence of obstruction. Enteric contrast is visualized to the level of the sigmoid colon. Moderate amount of retained stool is again noted within the colon and rectum. Sigmoid colonic diverticulosis without evidence of acute diverticulitis. Vascular/Lymphatic: Aortic atherosclerosis. Redemonstrated possible calcified dissection flap versus penetrating ulcer in the distal abdominal aorta. No enlarged abdominal or pelvic lymph nodes. Reproductive: Status post hysterectomy. No adnexal masses. Other: No abdominopelvic ascites. No intraperitoneal free air. Implanted pump device in the anterior right abdomen. Musculoskeletal: Redemonstrated severe compression deformity of L1 with  retropulsion resulting in severe spinal canal stenosis and levocurvature. Unchanged sclerosis of the superior L2 endplate. Multilevel degenerative changes of the thoracolumbar spine. IMPRESSION: 1. No acute localizing findings in the abdomen or pelvis. 2. Unchanged complex mass in the mid left kidney, compatible with known renal cell carcinoma, better delineated and measuring up to 3.1 cm on the prior contrast-enhanced CT dated 05/15/2024. 3. Persistent unchanged pneumobilia with patent common bile duct stent in place. 4. Redemonstrated severe compression deformity of L1 with retropulsion resulting in severe spinal canal stenosis and levocurvature. 5. Additional unchanged chronic findings, as above. 6.  Aortic Atherosclerosis (ICD10-I70.0). Electronically Signed   By: Harrietta Sherry M.D.   On: 05/18/2024 17:03   US  Venous Img Lower Unilateral Right (DVT) Result Date: 05/16/2024 CLINICAL DATA:  Right leg pain and swelling EXAM: Right LOWER EXTREMITY VENOUS DOPPLER ULTRASOUND TECHNIQUE: Gray-scale sonography with compression, as well as color and duplex ultrasound, were performed to evaluate the deep venous system(s) from the level of the common femoral vein through the popliteal and proximal calf veins. COMPARISON:  None Available. FINDINGS: VENOUS Normal compressibility of the common femoral, superficial femoral, and popliteal veins, as well as the visualized calf veins. Visualized portions of profunda femoral vein and great saphenous vein unremarkable. No filling defects to suggest DVT on grayscale or color Doppler imaging. Doppler waveforms show normal direction of venous flow, normal respiratory plasticity and response to augmentation. Limited views of the contralateral common femoral vein are unremarkable. OTHER None. Limitations: none IMPRESSION: No evidence of right lower extremity DVT. Electronically Signed   By: Ranell Bring M.D.   On: 05/16/2024 11:14   CT ABDOMEN PELVIS W CONTRAST Result Date:  05/15/2024 CLINICAL DATA:  Abdominal pain, acute, nonlocalized.  Fever. EXAM: CT ABDOMEN AND PELVIS WITH CONTRAST TECHNIQUE: Multidetector CT imaging of the abdomen and pelvis was performed using the standard protocol following bolus administration of intravenous contrast. RADIATION DOSE REDUCTION: This exam was performed according to the departmental dose-optimization program which includes automated exposure control, adjustment of the mA and/or kV according to patient size and/or use of iterative reconstruction technique. CONTRAST:  OMNIPAQUE  IOHEXOL  300 MG/ML  SOLN COMPARISON:  05/01/2024. FINDINGS: Lower chest: Multivessel coronary artery calcifications are noted. Atelectasis is noted at the lung bases. Hepatobiliary: No focal abnormality in the liver. There is pneumobilia with a common bile duct stent in place. The stent appears patent. The gallbladder is surgically absent. Pancreas: The pancreatic duct is prominent  at 4 mm. No surrounding inflammatory changes are seen. Spleen: Normal in size without focal abnormality. Adrenals/Urinary Tract: The adrenal glands are within normal limits. The kidneys enhance symmetrically. There is a complex enhancing mass in the mid left kidney measuring up to 3.1 cm. A cyst is noted in the lower pole the left kidney. No renal calculus or hydronephrosis bilaterally. The bladder is unremarkable. Stomach/Bowel: The stomach is within normal limits. No bowel obstruction, free air, or pneumatosis. A moderate amount of retained stool is present in the colon. A few scattered diverticula are present along the colon without evidence of diverticulitis. Appendix is not seen. Vascular/Lymphatic: Aortic atherosclerosis. There is redemonstration of a possible calcified dissection flap versus penetrating ulcer in the distal abdominal aorta. No enlarged abdominal or pelvic lymph nodes. Reproductive: Status post hysterectomy. No adnexal masses. Other: No abdominopelvic ascites. Implanted  pump device is seen with in the anterior right abdomen Musculoskeletal: Degenerative changes are present in the thoracolumbar spine. There is severe spinal canal stenosis with associated compression deformity at the level L1 with retropulsed element, similar in appearance to the prior exam. Sclerosis of the superior endplate at L2 is unchanged. No new acute osseous abnormality is seen. IMPRESSION: 1. No acute intra-abdominal process. 2. Mild in overall large mint of complex enhancing mass in the left kidney, compatible with known renal cell carcinoma. 3. Pneumobilia with intrahepatic and extrahepatic biliary ductal dilatation and patent common bile duct stent in place. 4. Moderate amount of retained stool in the colon. 5. Stable compression deformity at L1 with retropulsed elements resulting in severe spinal canal stenosis. 6. Coronary artery calcifications and aortic atherosclerosis. 7. Remaining chronic findings as described above. Electronically Signed   By: Leita Birmingham M.D.   On: 05/15/2024 16:49   DG Chest Port 1 View Result Date: 05/15/2024 CLINICAL DATA:  Shortness of breath.  Abdominal pain and vomiting. EXAM: PORTABLE CHEST 1 VIEW COMPARISON:  12/31/2023. FINDINGS: The heart size and mediastinal contours are within normal limits. There is atherosclerotic calcification of the aorta. No consolidation, effusion, or pneumothorax is seen. No acute osseous abnormality. Surgical clips are identified in the right upper quadrant. IMPRESSION: No active disease. Electronically Signed   By: Leita Birmingham M.D.   On: 05/15/2024 15:49  [2 weeks]  Assessment:   78 y.o. year old female  with history of metastatic breast cancer to bone on Faslodex  and Xgeva  and has intrathecal pump, left renal mass concerning for cancer, cholangitis April and May 2025 with stent placement at [facility], duodenal ulcers April 2025 noted at the time of first ERCP with associated acquired duodenal stenosis, CAD, CKD, COPD, anxiety,  depression, PE, afib, on Eliquis , who presented to the emergency room due to persistent upper to mid abdominal pain, found to have likely pancreatitis on CT along with gastroduodenitis and/or PUD, and stool-filled colon.    Abnormal pancreas on imaging:  CT A/P with contrast demonstrated pancreatic ductal dilation with prominence of pancreatic head and mild stranding around the pancreatic head that may indicate pancreatitis.  Lipase only mildly elevated at 69 I do note that pancreatic duct dilation appears to be a newer finding as this was not demonstrated on MRCP in April or CT scans until 05/30/24.    Denies new medication, NSAIDs, ETOH. She has history of cholangitis with debris in her CBD, but has metal stent in place.  Alk phos is elevated, but has been slowly improving since placement of biliary stent, and bili has remained normal making pancreatitis less  likely to be a biliary issue, but unable to rule this out. Considering new pancreatic duct dilation, would consider MRCP to re-evaluate this area. Dr. Eartha communicated with ERCP endoscopist, Dr. Jinny who placed the stent and per Dr. Jinny who reached the [facility] [employer] rep, this stent is MRI compatible.    MR Abd with and without contrast completed this morning. 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 Subtle 1.3 X 1.6 cm mildly hypoenhancing area in pancreatic head on the left side of the main pancreatic duct which is indeterminate, likely needs EUS and FNA to differentiate. 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    Despite patient having abdominal pain which may be multifactorial in the setting of ?pancreatitis, PUD, and constipation, she denies postprandial symptoms and was requesting something to eat.    History of PUD:  Duodenal ulcers noted on ERCP in April 2024.  No biopsies were taken.  She is been on PPI twice daily since that time and  possibly Carafate , but unclear if she is taking as prescribed.  CT this admission with wall thickening in the pyloric region of the stomach and duodenum suggesting gastroduodenitis and/or PUD.  She will need repeat EGD to evaluate further, document ulcer healing, and biopsy to rule out H. pylori. EGD planned for this admission. Will need to hold Eliquis  x 48 hours prior. Received dose morning of 06/07/24. Plans for EGD Thursday.  Melena: It is notable that NTs reported two black stools yesterday evening, first one small (Bristol 7), second one large, (Bristol 1). Hgb overall stable. May be secondary to oral iron.    Constipation:  Chronic. Not adequately managed.  Could certainly be contributing to her chronic abdominal pain.  Previously improved with MiraLAX  twice daily, but has not been taking daily as outpatient.  Chronic normocytic anemia: chronic anemia since 2021, Hgb consistently in the 10-11 range until March/April 2025. In 01/2024, noted drop in Hgb into 9 range with further decline decline to 6.7 in mid April. Around that time she was noted to have oozing duodenal ulcers at time of ERCP. At discharge on 4/18, Hgb 7.8. Hgb consistently in the 8 range until 5/23, Hgb down to 6.8. No mention of duodenal ulcers or bleeding on 03/2024 ERCP. Patient had declined blood transfusion that admission but when presented back to ED on 6/1 with Hgb 6.6 she was transfused. On presentation this admission, Hgb 10.8 and has been stable. No previous colonoscopy. No iron/b12/folate deficiency on labs in April/May of this year.      Plan:   Continue full liquid diet.  Plans for EGD tomorrow to evaluate for persistent duodenal ulcers, persistent upper abdominal pain. She has had black stools documented this admission. Hgb stable. Off eliquis , last dose 7/8 am.  Hold Lovenox  for tomorrow morning.  Continue PPI BID. Continue miralax  17g BID. Monitor for overt GI bleeding. Trend H/H.  CA 19-9.  Suspect EUS with FNA  may be needed to determine etiology of pancreatic lesion if appropriate given multiple comorbidities.    LOS: 1 day   Sonny RAMAN. Ezzard RIGGERS [facility] [phone] 7/9/20252:10 PM

## 2024-06-08 NOTE — Plan of Care (Signed)
   Problem: Health Behavior/Discharge Planning: Goal: Ability to manage health-related needs will improve Outcome: Progressing

## 2024-06-09 ENCOUNTER — Encounter (HOSPITAL_COMMUNITY)
Admission: EM | Disposition: A | Discharge status: Home Health | Payer: Self-pay | Source: Home / Self Care | Attending: Internal Medicine

## 2024-06-09 ENCOUNTER — Inpatient Hospital Stay (HOSPITAL_COMMUNITY): Admitting: Anesthesiology

## 2024-06-09 ENCOUNTER — Encounter (HOSPITAL_COMMUNITY): Payer: Self-pay | Admitting: Family Medicine

## 2024-06-09 ENCOUNTER — Telehealth (INDEPENDENT_AMBULATORY_CARE_PROVIDER_SITE_OTHER): Payer: Self-pay | Admitting: Gastroenterology

## 2024-06-09 DIAGNOSIS — I2510 Atherosclerotic heart disease of native coronary artery without angina pectoris: Secondary | ICD-10-CM

## 2024-06-09 DIAGNOSIS — Z7189 Other specified counseling: Secondary | ICD-10-CM | POA: Diagnosis not present

## 2024-06-09 DIAGNOSIS — K2970 Gastritis, unspecified, without bleeding: Secondary | ICD-10-CM

## 2024-06-09 DIAGNOSIS — K2289 Other specified disease of esophagus: Secondary | ICD-10-CM

## 2024-06-09 DIAGNOSIS — K2990 Gastroduodenitis, unspecified, without bleeding: Secondary | ICD-10-CM

## 2024-06-09 DIAGNOSIS — I5032 Chronic diastolic (congestive) heart failure: Secondary | ICD-10-CM

## 2024-06-09 DIAGNOSIS — T183XXA Foreign body in small intestine, initial encounter: Secondary | ICD-10-CM | POA: Diagnosis not present

## 2024-06-09 DIAGNOSIS — K859 Acute pancreatitis without necrosis or infection, unspecified: Secondary | ICD-10-CM | POA: Diagnosis not present

## 2024-06-09 DIAGNOSIS — K2950 Unspecified chronic gastritis without bleeding: Secondary | ICD-10-CM

## 2024-06-09 DIAGNOSIS — I110 Hypertensive heart disease with heart failure: Secondary | ICD-10-CM

## 2024-06-09 DIAGNOSIS — Z515 Encounter for palliative care: Secondary | ICD-10-CM

## 2024-06-09 HISTORY — PX: ESOPHAGOGASTRODUODENOSCOPY: SHX5428

## 2024-06-09 LAB — CBC
HCT: 32.5 % — ABNORMAL LOW (ref 36.0–46.0)
Hemoglobin: 10 g/dL — ABNORMAL LOW (ref 12.0–15.0)
MCH: 33 pg (ref 26.0–34.0)
MCHC: 30.8 g/dL (ref 30.0–36.0)
MCV: 107.3 fL — ABNORMAL HIGH (ref 80.0–100.0)
Platelets: 196 K/uL (ref 150–400)
RBC: 3.03 MIL/uL — ABNORMAL LOW (ref 3.87–5.11)
RDW: 14.3 % (ref 11.5–15.5)
WBC: 3.5 K/uL — ABNORMAL LOW (ref 4.0–10.5)
nRBC: 0 % (ref 0.0–0.2)

## 2024-06-09 LAB — COMPREHENSIVE METABOLIC PANEL WITH GFR
ALT: 20 U/L (ref 0–44)
AST: 45 U/L — ABNORMAL HIGH (ref 15–41)
Albumin: 2.1 g/dL — ABNORMAL LOW (ref 3.5–5.0)
Alkaline Phosphatase: 351 U/L — ABNORMAL HIGH (ref 38–126)
Anion gap: 6 (ref 5–15)
BUN: 8 mg/dL (ref 8–23)
CO2: 27 mmol/L (ref 22–32)
Calcium: 8.2 mg/dL — ABNORMAL LOW (ref 8.9–10.3)
Chloride: 108 mmol/L (ref 98–111)
Creatinine, Ser: 0.49 mg/dL (ref 0.44–1.00)
GFR, Estimated: >60 mL/min (ref >60)
Glucose, Bld: 95 mg/dL (ref 70–99)
Potassium: 3.7 mmol/L (ref 3.5–5.1)
Sodium: 141 mmol/L (ref 135–145)
Total Bilirubin: 0.7 mg/dL (ref 0.0–1.2)
Total Protein: 5.7 g/dL — ABNORMAL LOW (ref 6.5–8.1)

## 2024-06-09 LAB — MAGNESIUM: Magnesium: 1.8 mg/dL (ref 1.7–2.4)

## 2024-06-09 SURGERY — EGD (ESOPHAGOGASTRODUODENOSCOPY)
Anesthesia: General

## 2024-06-09 MED ORDER — PHENYLEPHRINE 80 MCG/ML (10ML) SYRINGE FOR IV PUSH (FOR BLOOD PRESSURE SUPPORT)
PREFILLED_SYRINGE | INTRAVENOUS | Status: DC | PRN
  Administered 2024-06-09: 80 ug via INTRAVENOUS

## 2024-06-09 MED ORDER — LACTATED RINGERS IV SOLN: INTRAVENOUS | Status: DC | PRN

## 2024-06-09 MED ORDER — LIDOCAINE 2% (20 MG/ML) 5 ML SYRINGE
INTRAMUSCULAR | Status: DC | PRN
  Administered 2024-06-09: 60 mg via INTRAVENOUS

## 2024-06-09 MED ORDER — SENNOSIDES-DOCUSATE SODIUM 8.6-50 MG PO TABS: 2.0000 | ORAL_TABLET | Freq: Every day | ORAL | 3 refills | Status: AC

## 2024-06-09 MED ORDER — FERROUS SULFATE 325 (65 FE) MG PO TBEC: 325.0000 mg | DELAYED_RELEASE_TABLET | Freq: Every day | ORAL | 3 refills | Status: AC

## 2024-06-09 MED ORDER — SODIUM CHLORIDE 0.9 % IV SOLN: INTRAVENOUS | Status: DC

## 2024-06-09 MED ORDER — PROPOFOL 10 MG/ML IV BOLUS
INTRAVENOUS | Status: DC | PRN
  Administered 2024-06-09: 40 mg via INTRAVENOUS
  Administered 2024-06-09 (×2): 20 mg via INTRAVENOUS

## 2024-06-09 NOTE — Telephone Encounter (Signed)
Message sent to [facility]

## 2024-06-09 NOTE — Transfer of Care (Signed)
 Immediate Anesthesia Transfer of Care Note  Patient: [name]  Procedure(s) Performed: EGD (ESOPHAGOGASTRODUODENOSCOPY)  Patient Location: PACU  Anesthesia Type:General  Level of Consciousness: drowsy  Airway & Oxygen  Therapy: Patient Spontanous Breathing  Post-op Assessment: Report given to RN and Post -op Vital signs reviewed and stable  Post vital signs: Reviewed and stable  Last Vitals:  Vitals Value Taken Time  BP 102/46   Temp 98.5   Pulse 76   Resp 17 06/09/24 09:09  SpO2 100%   Vitals shown include unfiled device data.  Last Pain:  Vitals:   06/09/24 0850  TempSrc:   PainSc: 0-No pain         Complications: No notable events documented.

## 2024-06-09 NOTE — Interval H&P Note (Signed)
 History and Physical Interval Note:  06/09/2024 8:56 AM  Stacey  B [name]  has presented today for surgery, with the diagnosis of abdominal pain, history of duodenal ulcers need biopsy to rule out helicobacter pylori, current imaging with gastroduodenitis, possible melena.  The various methods of treatment have been discussed with the patient and family. After consideration of risks, benefits and other options for treatment, the patient has consented to  Procedure(s): EGD (ESOPHAGOGASTRODUODENOSCOPY) (N/A) as a surgical intervention.  The patient's history has been reviewed, patient examined, no change in status, stable for surgery.  I have reviewed the patient's chart and labs.  Questions were answered to the patient's satisfaction.     Deatrice FALCON Geraline Halberstadt

## 2024-06-09 NOTE — Discharge Summary (Signed)
 Physician Discharge Summary  Stacey [name]  ANJOLINA BYRER FMW:[id number] DOB: 10-09-1946 DOA: 06/06/2024  PCP: Rosamond Leta KATHEE, MD  Admit date: 06/06/2024  Discharge date: 06/09/2024  Admitted From:Home  Disposition:  Home  Recommendations for Outpatient Follow-up:  Follow up with PCP in 1-2 weeks Follow-up with GI with referral sent for EUS/FNA of pancreatic mass Continue other home medications as prior  Home Health: PT/OT/RN/SW  Equipment/Devices: None  Discharge Condition:Stable  CODE STATUS: DNR  Diet recommendation: Heart Healthy  Brief/Interim Summary: Stacey  B [name] is a 78 y.o. female with medical history significant for biliary stricture status post stenting, ascending cholangitis, breast cancer metastasized to bone-follows at [facility], pathological L1 fracture, chronic pain with intrathecal pump (morphine /bupivacaine ), PE, atrial fibrillation, CAD with prior stenting, CKD stage IIIb, left-sided renal mass, COPD, anxiety, and depression who presented to the ED with generalized abdominal pain.  Patient admitted for possible acute pancreatitis and has been seen by GI given complex history and is noted to possibly have some pain related to peptic ulcer disease and there is concern for possible pancreatic malignancy that will require MRCP.  She has also undergone endoscopy on 7/10 with noted gastritis and esophagitis with biopsies obtained.  MRCP demonstrating new pancreatic mass for which patient would like to undergo EUS/FNA for further evaluation and management if possible.  She will be referred outpatient for this per GI.  She is otherwise tolerating diet and has no further abdominal pain and is overall comfortable.  No other acute events or concerns noted throughout the course of this admission and she is stable for discharge to resume home physical therapy services.  Discharge Diagnoses:  Principal Problem:   Acute pancreatitis Active Problems:   Atrial fibrillation, chronic (HCC)   HLD  (hyperlipidemia)   COPD (chronic obstructive pulmonary disease) (HCC)   HTN (hypertension)   Anxiety and depression   Metastasis to bone (HCC)   Malignant neoplasm of right breast in female, estrogen receptor positive (HCC)   Liver lesion   Biliary stricture   Melena   Gastritis and gastroduodenitis  Principal discharge diagnosis: Abdominal pain with nausea and vomiting with noted new pancreatic mass, gastritis and esophagitis on EGD.  Mild pancreatitis.  Discharge Instructions  Discharge Instructions     Diet - low sodium heart healthy   Complete by: As directed    Increase activity slowly   Complete by: As directed    No wound care   Complete by: As directed       Allergies as of 06/09/2024       Reactions   Albuterol  Other (See Comments)   Heart racing   Brilinta  [ticagrelor ] Diarrhea, Nausea Only   Nausea and severe diarrhea- general weakness. Patient says does not want to take again   Budesonide  Palpitations        Medication List     TAKE these medications    ALPRAZolam  0.5 MG tablet Commonly known as: XANAX  Take 1 tablet (0.5 mg total) by mouth 2 (two) times daily as needed for anxiety or sleep.   apixaban  2.5 MG Tabs tablet Commonly known as: ELIQUIS  Take 2.5 mg by mouth daily.   b complex vitamins capsule Take 1 capsule by mouth daily.   carboxymethylcellulose 0.5 % Soln Commonly known as: REFRESH PLUS Place 1 drop into both eyes 3 (three) times daily as needed (Dry Eyes).   clopidogrel  75 MG tablet Commonly known as: PLAVIX  Take 75 mg by mouth daily.   DULoxetine  30 MG capsule Commonly known as: CYMBALTA   Take 30 mg by mouth 2 (two) times daily.   ferrous sulfate  325 (65 FE) MG EC tablet Take 1 tablet (325 mg total) by mouth daily with breakfast.   HYDROmorphone  2 MG tablet Commonly known as: DILAUDID  Take 2 mg by mouth every 6 (six) hours as needed for severe pain (pain score 7-10). Via pump   methocarbamol  500 MG tablet Commonly  known as: ROBAXIN  Take 500 mg by mouth every 6 (six) hours as needed for muscle spasms.   metoprolol  succinate 25 MG 24 hr tablet Commonly known as: TOPROL -XL Take 3 tablets (75 mg total) by mouth daily.   nitroGLYCERIN  0.4 MG SL tablet Commonly known as: NITROSTAT  Place 0.4 mg under the tongue every 5 (five) minutes as needed for chest pain.   ondansetron  4 MG tablet Commonly known as: ZOFRAN  Take 4 mg by mouth every 8 (eight) hours as needed for nausea or vomiting.   pantoprazole  40 MG tablet Commonly known as: Protonix  Take 1 tablet (40 mg total) by mouth 2 (two) times daily.   polyethylene glycol 17 g packet Commonly known as: MIRALAX  / GLYCOLAX  Take 17 g by mouth 2 (two) times daily.   senna-docusate 8.6-50 MG tablet Commonly known as: Senokot-S Take 2 tablets by mouth at bedtime.   sucralfate  1 GM/10ML suspension Commonly known as: CARAFATE  Take 10 mLs (1 g total) by mouth 4 (four) times daily -  before meals and at bedtime.        Follow-up Information     Vyas, Dhruv B, MD. Schedule an appointment as soon as possible for a visit in 1 week(s).   Specialty: Internal Medicine Contact information: [address] [place] [address] [phone]         [facility] Follow up.   Why: PT will call to schedule your next home visit.               Allergies  Allergen Reactions   Albuterol  Other (See Comments)    Heart racing    Brilinta  [Ticagrelor ] Diarrhea and Nausea Only    Nausea and severe diarrhea- general weakness. Patient says does not want to take again   Budesonide  Palpitations    Consultations: GI Palliative care   Procedures/Studies: MR ABDOMEN MRCP W WO CONTAST Result Date: 06/08/2024 CLINICAL DATA:  pancreatitis, pancreatic duct dilation, history of cholangitis with current biliary stent in place; 855384 Pain 144615 pancreatitis,. EXAM: MRI ABDOMEN WITHOUT AND WITH CONTRAST (INCLUDING MRCP) TECHNIQUE: Multiplanar  multisequence MR imaging of the abdomen was performed both before and after the administration of intravenous contrast. Heavily T2-weighted images of the biliary and pancreatic ducts were obtained, and three-dimensional MRCP images were rendered by post processing. CONTRAST:  7mL GADAVIST  GADOBUTROL  1 MMOL/ML IV SOLN COMPARISON:  CT scan abdomen and pelvis from 06/07/2024. FINDINGS: Lower chest: Unremarkable MR appearance to the lung bases. No pleural effusion. No pericardial effusion. Normal heart size. Hepatobiliary: The liver is normal in size and configuration. No intrahepatic bile duct dilation. There is susceptibility artifacts in the extrahepatic bile duct from metallic stent. There is pneumobilia mainly in the left lobe ducts. The gallbladder is surgically absent. There are susceptibility artifacts in the gallbladder fossa region, from cholecystectomy clips. Pancreas: Redemonstration of diffuse a trophy of the pancreas with resultant dilated main pancreatic duct measuring up to 6-7 mm in the neck/body region. There is subtle mildly hypoenhancing area measuring at least 1.3 x 1.6 cm in the pancreatic head on the left side of  the main pancreatic duct. There is associated after up cutoff of the main pancreatic duct. The area appears slightly T2 hyperintense. It is not well evaluated on the diffusion-weighted images due to extensive artifacts. This is indeterminate in etiology and differential diagnosis includes focal pancreatic adenocarcinoma versus mass forming chronic pancreatitis. EUS and FNA is recommended for differentiation. No peripancreatic fat stranding or peripancreatic walled-off collections. There is trace amount of fluid in the left paracolic gutter, likely sequela of pancreatitis. Spleen:  Within normal limits in size and appearance. No focal mass. Adrenals/Urinary Tract: Unremarkable adrenal glands. Redemonstration of previously seen heterogeneously enhancing 2.5 x 2.8 x 3.1 cm mass arising from  the left kidney upper pole/interpolar region, anterolaterally. There is a simple cyst arising from the left kidney lower pole, posteriorly measuring up to 1.3 x 1.4 cm. Bilateral kidneys are otherwise essentially unremarkable. No hydronephrosis. Right extrarenal pelvis noted. Stomach/Bowel: Visualized portions within the abdomen are unremarkable. No disproportionate dilation of bowel loops. Vascular/Lymphatic: No pathologically enlarged lymph nodes identified. No abdominal aortic aneurysm demonstrated. No ascites. Other:  None. Musculoskeletal: No suspicious bone lesions identified. There is moderate compression deformity of L1 vertebrae, better evaluated on the CT scan exam. IMPRESSION: 1. There is subtle mildly hypoenhancing area in the pancreatic head region, which is indeterminate in etiology and differential diagnosis includes focal pancreatic adenocarcinoma versus mass forming chronic pancreatitis. Correlation with EUS and FNA is recommended for differentiation. 2. Redemonstration of previously seen heterogeneously enhancing 2.5 x 2.8 x 3.1 cm mass arising from the left kidney upper pole/interpolar region, anterolaterally, compatible with renal cell carcinoma. 3. No metastatic disease identified within the abdomen. Electronically Signed   By: Ree Molt M.D.   On: 06/08/2024 09:43   MR 3D Recon At Scanner Result Date: 06/08/2024 CLINICAL DATA:  pancreatitis, pancreatic duct dilation, history of cholangitis with current biliary stent in place; 855384 Pain 144615 pancreatitis,. EXAM: MRI ABDOMEN WITHOUT AND WITH CONTRAST (INCLUDING MRCP) TECHNIQUE: Multiplanar multisequence MR imaging of the abdomen was performed both before and after the administration of intravenous contrast. Heavily T2-weighted images of the biliary and pancreatic ducts were obtained, and three-dimensional MRCP images were rendered by post processing. CONTRAST:  7mL GADAVIST  GADOBUTROL  1 MMOL/ML IV SOLN COMPARISON:  CT scan abdomen and  pelvis from 06/07/2024. FINDINGS: Lower chest: Unremarkable MR appearance to the lung bases. No pleural effusion. No pericardial effusion. Normal heart size. Hepatobiliary: The liver is normal in size and configuration. No intrahepatic bile duct dilation. There is susceptibility artifacts in the extrahepatic bile duct from metallic stent. There is pneumobilia mainly in the left lobe ducts. The gallbladder is surgically absent. There are susceptibility artifacts in the gallbladder fossa region, from cholecystectomy clips. Pancreas: Redemonstration of diffuse a trophy of the pancreas with resultant dilated main pancreatic duct measuring up to 6-7 mm in the neck/body region. There is subtle mildly hypoenhancing area measuring at least 1.3 x 1.6 cm in the pancreatic head on the left side of the main pancreatic duct. There is associated after up cutoff of the main pancreatic duct. The area appears slightly T2 hyperintense. It is not well evaluated on the diffusion-weighted images due to extensive artifacts. This is indeterminate in etiology and differential diagnosis includes focal pancreatic adenocarcinoma versus mass forming chronic pancreatitis. EUS and FNA is recommended for differentiation. No peripancreatic fat stranding or peripancreatic walled-off collections. There is trace amount of fluid in the left paracolic gutter, likely sequela of pancreatitis. Spleen:  Within normal limits in size and appearance. No focal  mass. Adrenals/Urinary Tract: Unremarkable adrenal glands. Redemonstration of previously seen heterogeneously enhancing 2.5 x 2.8 x 3.1 cm mass arising from the left kidney upper pole/interpolar region, anterolaterally. There is a simple cyst arising from the left kidney lower pole, posteriorly measuring up to 1.3 x 1.4 cm. Bilateral kidneys are otherwise essentially unremarkable. No hydronephrosis. Right extrarenal pelvis noted. Stomach/Bowel: Visualized portions within the abdomen are unremarkable. No  disproportionate dilation of bowel loops. Vascular/Lymphatic: No pathologically enlarged lymph nodes identified. No abdominal aortic aneurysm demonstrated. No ascites. Other:  None. Musculoskeletal: No suspicious bone lesions identified. There is moderate compression deformity of L1 vertebrae, better evaluated on the CT scan exam. IMPRESSION: 1. There is subtle mildly hypoenhancing area in the pancreatic head region, which is indeterminate in etiology and differential diagnosis includes focal pancreatic adenocarcinoma versus mass forming chronic pancreatitis. Correlation with EUS and FNA is recommended for differentiation. 2. Redemonstration of previously seen heterogeneously enhancing 2.5 x 2.8 x 3.1 cm mass arising from the left kidney upper pole/interpolar region, anterolaterally, compatible with renal cell carcinoma. 3. No metastatic disease identified within the abdomen. Electronically Signed   By: Ree Molt M.D.   On: 06/08/2024 09:43   CT ABDOMEN PELVIS W CONTRAST Result Date: 06/07/2024 CLINICAL DATA:  Acute nonlocalized abdominal pain. History of gastric ulcers. EXAM: CT ABDOMEN AND PELVIS WITH CONTRAST TECHNIQUE: Multidetector CT imaging of the abdomen and pelvis was performed using the standard protocol following bolus administration of intravenous contrast. RADIATION DOSE REDUCTION: This exam was performed according to the departmental dose-optimization program which includes automated exposure control, adjustment of the mA and/or kV according to patient size and/or use of iterative reconstruction technique. CONTRAST:  OMNIPAQUE  IOHEXOL  300 MG/ML  SOLN COMPARISON:  05/30/2024 FINDINGS: Lower chest: Lung bases are clear. Hepatobiliary: Biliary stent in place. Pneumobilia consistent with stent placement. Surgical absence of the gallbladder. No focal liver lesions. Similar appearance to prior study. Pancreas: Diffuse pancreatic ductal dilatation. Mild infiltration in the region of the head of  the pancreas. Small amount of fluid in the anterior pararenal fascia. Changes may indicate acute pancreatitis. Mild progression since prior study. Spleen: Normal in size without focal abnormality. Adrenals/Urinary Tract: Heterogeneously enhancing mass in the left kidney measuring 3.1 cm diameter. Similar appearance to previous study. The appearance is most consistent with renal cell carcinoma. No hydronephrosis or hydroureter. Bladder is decompressed. Stomach/Bowel: Stomach, small bowel, and colon are not abnormally distended. Wall thickening the pyloric region of the stomach and in the duodenum possibly representing gastritis/peptic ulcer disease with duodenitis. Stool-filled colon. Appendix is not identified. Vascular/Lymphatic: Aortic atherosclerosis. No enlarged abdominal or pelvic lymph nodes. Reproductive: Status post hysterectomy. No adnexal masses. Other: No free air or free fluid in the abdomen. Abdominal wall musculature appears intact. Fatty atrophy of the abdominal musculature. Implant in the anterior subcutaneous fat over the right lower quadrant probably medication portal. Musculoskeletal: Compression deformity of L2 unchanged since prior study. Severe degenerative changes at L1-2 and L2-3 levels. No acute bony abnormalities. IMPRESSION: 1. Pancreatic ductal dilatation with prominence of the pancreatic head and mild stranding around the pancreatic head. This may indicate pancreatitis and is progressed since prior study. 2. Wall thickening in the pyloric region of the stomach and in the duodenum suggesting gastric duodenitis and/or peptic ulcer disease. 3. Pneumobilia due to biliary duct stent. 4. Heterogeneously enhancing mass in the left kidney is unchanged since prior study. Appearance is most consistent with renal cell carcinoma. 5. Aortic atherosclerosis. Electronically Signed   By: Elsie  Mannie M.D.   On: 06/07/2024 00:16   CT ABDOMEN PELVIS W CONTRAST Result Date: 05/30/2024 CLINICAL DATA:   Abdominal pain since this morning EXAM: CT ABDOMEN AND PELVIS WITH CONTRAST TECHNIQUE: Multidetector CT imaging of the abdomen and pelvis was performed using the standard protocol following bolus administration of intravenous contrast. RADIATION DOSE REDUCTION: This exam was performed according to the departmental dose-optimization program which includes automated exposure control, adjustment of the mA and/or kV according to patient size and/or use of iterative reconstruction technique. CONTRAST:  OMNIPAQUE  IOHEXOL  300 MG/ML  SOLN COMPARISON:  Same day abdominal radiographs and CT 05/18/2024 FINDINGS: Lower chest: No acute abnormality. Hepatobiliary: Unchanged biliary stent and pneumobilia. Cholecystectomy. No focal hepatic lesion. Pancreas: Prominent pancreatic duct measuring up to 6 mm. This may have slightly increased compared to 05/18/2024. Trace stranding about the pancreatic head. Correlate with lipase for acute pancreatitis. Spleen: Unremarkable. Adrenals/Urinary Tract: Normal adrenal glands. Unchanged enhancing heterogenous neoplasm in the left kidney measuring 3.0 cm. No urinary calculi or hydronephrosis. Nondistended bladder. Stomach/Bowel: Normal caliber large and small bowel. Moderate colonic stool burden. Stomach is within normal limits. The appendix is not visualized. Vascular/Lymphatic: Advanced aortic atherosclerotic calcification. No lymphadenopathy. Reproductive: Hysterectomy.  No adnexal mass. Other: Trace free fluid in the pelvis.  No free intraperitoneal air. Musculoskeletal: No acute fracture. Chronic compression fracture of L1 with retropulsion. IMPRESSION: 1. Increased dilation of the main pancreatic duct now measuring 6 mm. Trace stranding about the pancreatic head. Correlate with lipase for acute pancreatitis. 2. Unchanged biliary stent and pneumobilia. 3. Unchanged 3.0 cm left renal mass compatible with renal cell carcinoma. 4. Severe compression deformity of L1 with retropulsion  resulting in severe spinal canal stenosis, unchanged. 5. Aortic Atherosclerosis (ICD10-I70.0). Electronically Signed   By: Norman Gatlin M.D.   On: 05/30/2024 02:45   DG Abdomen 1 View Result Date: 05/30/2024 CLINICAL DATA:  Abdominal pain onset this morning. EXAM: ABDOMEN - 1 VIEW COMPARISON:  CT with IV contrast 05/18/2024. FINDINGS: Nonobstructive bowel pattern with moderate fecal stasis. Right upper quadrant surgical clips and a common bile duct stent are again noted as well as an implanted pump device on the right. There is no supine evidence of free air or other acute radiographic findings. There is a severe chronic compression fracture of the L1 vertebral body, levoscoliosis and advanced degenerative change of the lumbar spine. Lung bases are clear. No new abnormality. IMPRESSION: 1. Nonobstructive bowel pattern with moderate fecal stasis. 2. No other acute radiographic findings. 3. Chronic findings as above. Electronically Signed   By: Francis Quam M.D.   On: 05/30/2024 01:10   DG Chest Port 1 View Result Date: 05/29/2024 CLINICAL DATA:  Upper abdominal pain EXAM: PORTABLE CHEST 1 VIEW COMPARISON:  Chest radiograph 05/15/2024 FINDINGS: Low lung volumes. Stable cardiomediastinal silhouette. Aortic atherosclerotic calcification. No focal consolidation, pleural effusion, or pneumothorax. No displaced rib fractures. IMPRESSION: No active disease. Electronically Signed   By: Norman Gatlin M.D.   On: 05/29/2024 23:05   CT ABDOMEN PELVIS WO CONTRAST Result Date: 05/18/2024 CLINICAL DATA:  Worsening abdominal pain. EXAM: CT ABDOMEN AND PELVIS WITHOUT CONTRAST TECHNIQUE: Multidetector CT imaging of the abdomen and pelvis was performed following the standard protocol without IV contrast. RADIATION DOSE REDUCTION: This exam was performed according to the departmental dose-optimization program which includes automated exposure control, adjustment of the mA and/or kV according to patient size and/or use of  iterative reconstruction technique. COMPARISON:  05/15/2024. FINDINGS: Lower chest: No acute abnormality. Multivessel coronary artery  calcifications. Hepatobiliary: Persistent unchanged pneumobilia with common bile duct stent in place. Stent appears patent. No focal liver abnormality is seen. Gallbladder is surgically absent. Pancreas: Similar prominence of the pancreatic duct measuring up to 4 mm. No surrounding inflammatory changes. Spleen: Unremarkable. Adrenals/Urinary Tract: Adrenal glands are unremarkable. Unchanged complex mass in the mid left kidney, better delineated and measuring up to 3.1 cm on the prior contrast-enhanced CT dated 05/15/2024. No urolithiasis or hydronephrosis. Bladder is unremarkable. Stomach/Bowel: Stomach is within normal limits. No evidence of obstruction. Enteric contrast is visualized to the level of the sigmoid colon. Moderate amount of retained stool is again noted within the colon and rectum. Sigmoid colonic diverticulosis without evidence of acute diverticulitis. Vascular/Lymphatic: Aortic atherosclerosis. Redemonstrated possible calcified dissection flap versus penetrating ulcer in the distal abdominal aorta. No enlarged abdominal or pelvic lymph nodes. Reproductive: Status post hysterectomy. No adnexal masses. Other: No abdominopelvic ascites. No intraperitoneal free air. Implanted pump device in the anterior right abdomen. Musculoskeletal: Redemonstrated severe compression deformity of L1 with retropulsion resulting in severe spinal canal stenosis and levocurvature. Unchanged sclerosis of the superior L2 endplate. Multilevel degenerative changes of the thoracolumbar spine. IMPRESSION: 1. No acute localizing findings in the abdomen or pelvis. 2. Unchanged complex mass in the mid left kidney, compatible with known renal cell carcinoma, better delineated and measuring up to 3.1 cm on the prior contrast-enhanced CT dated 05/15/2024. 3. Persistent unchanged pneumobilia with patent  common bile duct stent in place. 4. Redemonstrated severe compression deformity of L1 with retropulsion resulting in severe spinal canal stenosis and levocurvature. 5. Additional unchanged chronic findings, as above. 6.  Aortic Atherosclerosis (ICD10-I70.0). Electronically Signed   By: Harrietta Sherry M.D.   On: 05/18/2024 17:03   US  Venous Img Lower Unilateral Right (DVT) Result Date: 05/16/2024 CLINICAL DATA:  Right leg pain and swelling EXAM: Right LOWER EXTREMITY VENOUS DOPPLER ULTRASOUND TECHNIQUE: Gray-scale sonography with compression, as well as color and duplex ultrasound, were performed to evaluate the deep venous system(s) from the level of the common femoral vein through the popliteal and proximal calf veins. COMPARISON:  None Available. FINDINGS: VENOUS Normal compressibility of the common femoral, superficial femoral, and popliteal veins, as well as the visualized calf veins. Visualized portions of profunda femoral vein and great saphenous vein unremarkable. No filling defects to suggest DVT on grayscale or color Doppler imaging. Doppler waveforms show normal direction of venous flow, normal respiratory plasticity and response to augmentation. Limited views of the contralateral common femoral vein are unremarkable. OTHER None. Limitations: none IMPRESSION: No evidence of right lower extremity DVT. Electronically Signed   By: Ranell Bring M.D.   On: 05/16/2024 11:14   CT ABDOMEN PELVIS W CONTRAST Result Date: 05/15/2024 CLINICAL DATA:  Abdominal pain, acute, nonlocalized.  Fever. EXAM: CT ABDOMEN AND PELVIS WITH CONTRAST TECHNIQUE: Multidetector CT imaging of the abdomen and pelvis was performed using the standard protocol following bolus administration of intravenous contrast. RADIATION DOSE REDUCTION: This exam was performed according to the departmental dose-optimization program which includes automated exposure control, adjustment of the mA and/or kV according to patient size and/or use of  iterative reconstruction technique. CONTRAST:  OMNIPAQUE  IOHEXOL  300 MG/ML  SOLN COMPARISON:  05/01/2024. FINDINGS: Lower chest: Multivessel coronary artery calcifications are noted. Atelectasis is noted at the lung bases. Hepatobiliary: No focal abnormality in the liver. There is pneumobilia with a common bile duct stent in place. The stent appears patent. The gallbladder is surgically absent. Pancreas: The pancreatic duct is prominent at 4  mm. No surrounding inflammatory changes are seen. Spleen: Normal in size without focal abnormality. Adrenals/Urinary Tract: The adrenal glands are within normal limits. The kidneys enhance symmetrically. There is a complex enhancing mass in the mid left kidney measuring up to 3.1 cm. A cyst is noted in the lower pole the left kidney. No renal calculus or hydronephrosis bilaterally. The bladder is unremarkable. Stomach/Bowel: The stomach is within normal limits. No bowel obstruction, free air, or pneumatosis. A moderate amount of retained stool is present in the colon. A few scattered diverticula are present along the colon without evidence of diverticulitis. Appendix is not seen. Vascular/Lymphatic: Aortic atherosclerosis. There is redemonstration of a possible calcified dissection flap versus penetrating ulcer in the distal abdominal aorta. No enlarged abdominal or pelvic lymph nodes. Reproductive: Status post hysterectomy. No adnexal masses. Other: No abdominopelvic ascites. Implanted pump device is seen with in the anterior right abdomen Musculoskeletal: Degenerative changes are present in the thoracolumbar spine. There is severe spinal canal stenosis with associated compression deformity at the level L1 with retropulsed element, similar in appearance to the prior exam. Sclerosis of the superior endplate at L2 is unchanged. No new acute osseous abnormality is seen. IMPRESSION: 1. No acute intra-abdominal process. 2. Mild in overall large mint of complex enhancing mass  in the left kidney, compatible with known renal cell carcinoma. 3. Pneumobilia with intrahepatic and extrahepatic biliary ductal dilatation and patent common bile duct stent in place. 4. Moderate amount of retained stool in the colon. 5. Stable compression deformity at L1 with retropulsed elements resulting in severe spinal canal stenosis. 6. Coronary artery calcifications and aortic atherosclerosis. 7. Remaining chronic findings as described above. Electronically Signed   By: Leita Birmingham M.D.   On: 05/15/2024 16:49   DG Chest Port 1 View Result Date: 05/15/2024 CLINICAL DATA:  Shortness of breath.  Abdominal pain and vomiting. EXAM: PORTABLE CHEST 1 VIEW COMPARISON:  12/31/2023. FINDINGS: The heart size and mediastinal contours are within normal limits. There is atherosclerotic calcification of the aorta. No consolidation, effusion, or pneumothorax is seen. No acute osseous abnormality. Surgical clips are identified in the right upper quadrant. IMPRESSION: No active disease. Electronically Signed   By: Leita Birmingham M.D.   On: 05/15/2024 15:49     Discharge Exam: Vitals:   06/09/24 0956 06/09/24 1309  BP: 137/67 133/74  Pulse: 92 94  Resp: 16 14  Temp: 98.1 F (36.7 C) 98.1 F (36.7 C)  SpO2: 95% 97%   Vitals:   06/09/24 0915 06/09/24 0930 06/09/24 0956 06/09/24 1309  BP: (!) 105/48 (!) 116/56 137/67 133/74  Pulse: 76 94 92 94  Resp: 14 14 16 14   Temp: 98.5 F (36.9 C) 98.5 F (36.9 C) 98.1 F (36.7 C) 98.1 F (36.7 C)  TempSrc:   Oral Oral  SpO2: 99% 98% 95% 97%  Weight:      Height:        General: Pt is alert, awake, not in acute distress Cardiovascular: RRR, S1/S2 +, no rubs, no gallops Respiratory: CTA bilaterally, no wheezing, no rhonchi Abdominal: Soft, NT, ND, bowel sounds + Extremities: no edema, no cyanosis    The results of significant diagnostics from this hospitalization (including imaging, microbiology, ancillary and laboratory) are listed below for  reference.     Microbiology: No results found for this or any previous visit (from the past 240 hours).   Labs: BNP (last 3 results) Recent Labs    04/14/24 1912  BNP 406.8*   Basic  Metabolic Panel: Recent Labs  Lab 06/06/24 2001 06/08/24 0446 06/09/24 0447  NA 140 139 141  K 4.1 3.8 3.7  CL 104 103 108  CO2 28 27 27   GLUCOSE 106* 102* 95  BUN 12 9 8   CREATININE 0.51 0.46 0.49  CALCIUM  8.6* 8.5* 8.2*  MG  --  1.8 1.8   Liver Function Tests: Recent Labs  Lab 06/06/24 2001 06/08/24 0446 06/09/24 0447  AST 48* 44* 45*  ALT 22 21 20   ALKPHOS 402* 384* 351*  BILITOT 0.8 0.9 0.7  PROT 6.3* 6.0* 5.7*  ALBUMIN  2.4* 2.2* 2.1*   Recent Labs  Lab 06/06/24 2001  LIPASE 69*   No results for input(s): AMMONIA in the last 168 hours. CBC: Recent Labs  Lab 06/06/24 1845 06/08/24 0446 06/09/24 0447  WBC 4.2 4.4 3.5*  HGB 10.3* 11.3* 10.0*  HCT 32.1* 35.1* 32.5*  MCV 106.6* 106.4* 107.3*  PLT 246 204 196   Cardiac Enzymes: No results for input(s): CKTOTAL, CKMB, CKMBINDEX, TROPONINI in the last 168 hours. BNP: Invalid input(s): POCBNP CBG: No results for input(s): GLUCAP in the last 168 hours. D-Dimer No results for input(s): DDIMER in the last 72 hours. Hgb A1c No results for input(s): HGBA1C in the last 72 hours. Lipid Profile No results for input(s): CHOL, HDL, LDLCALC, TRIG, CHOLHDL, LDLDIRECT in the last 72 hours. Thyroid  function studies No results for input(s): TSH, T4TOTAL, T3FREE, THYROIDAB in the last 72 hours.  Invalid input(s): FREET3 Anemia work up No results for input(s): VITAMINB12, FOLATE, FERRITIN, TIBC, IRON, RETICCTPCT in the last 72 hours. Urinalysis    Component Value Date/Time   COLORURINE YELLOW 06/06/2024 2346   APPEARANCEUR HAZY (A) 06/06/2024 2346   LABSPEC 1.009 06/06/2024 2346   PHURINE 7.0 06/06/2024 2346   GLUCOSEU NEGATIVE 06/06/2024 2346   HGBUR NEGATIVE 06/06/2024  2346   BILIRUBINUR NEGATIVE 06/06/2024 2346   KETONESUR NEGATIVE 06/06/2024 2346   PROTEINUR NEGATIVE 06/06/2024 2346   NITRITE NEGATIVE 06/06/2024 2346   LEUKOCYTESUR NEGATIVE 06/06/2024 2346   Sepsis Labs Recent Labs  Lab 06/06/24 1845 06/08/24 0446 06/09/24 0447  WBC 4.2 4.4 3.5*   Microbiology No results found for this or any previous visit (from the past 240 hours).   Time coordinating discharge: 35 minutes  SIGNED:   Adron JONETTA Fairly, DO [facility] 06/09/2024, 1:55 PM  If 7PM-7AM, please contact night-coverage [email or web address]

## 2024-06-09 NOTE — Anesthesia Preprocedure Evaluation (Addendum)
 Anesthesia Evaluation  Patient identified by MRN, date of birth, ID band Patient awake    Reviewed: Allergy & Precautions, H&P , NPO status , Patient's Chart, lab work & pertinent test results, reviewed documented beta blocker date and time   Airway Mallampati: I  TM Distance: >3 FB Neck ROM: Full    Dental no notable dental hx.    Pulmonary shortness of breath, COPD, former smoker   Pulmonary exam normal breath sounds clear to auscultation       Cardiovascular Exercise Tolerance: Good hypertension, + CAD, + Past MI, + Cardiac Stents and +CHF  + dysrhythmias + Valvular Problems/Murmurs AS  Rhythm:regular Rate:Normal     Neuro/Psych  PSYCHIATRIC DISORDERS Anxiety Depression Bipolar Disorder    Neuromuscular disease    GI/Hepatic Neg liver ROS, PUD,,,  Endo/Other  negative endocrine ROS    Renal/GU Renal disease  negative genitourinary   Musculoskeletal   Abdominal   Peds  Hematology  (+) Blood dyscrasia, anemia   Anesthesia Other Findings  1. Left ventricular ejection fraction, by estimation, is 50 to 55%. The  left ventricle has low normal function. The left ventricle has no regional  wall motion abnormalities. Left ventricular diastolic parameters are  consistent with Grade I diastolic  dysfunction (impaired relaxation).   2. Right ventricular systolic function is normal. The right ventricular  size is normal. There is mildly elevated pulmonary artery systolic  pressure. The estimated right ventricular systolic pressure is 45.0 mmHg.   3. Left atrial size was upper normal.   4. The mitral valve is grossly normal. Trivial mitral valve  regurgitation.   5. The aortic valve is tricuspid with some fusion of left and noncoronary  cusps. There is moderate calcification of the aortic valve. Aortic valve  regurgitation is not visualized. Moderate aortic valve stenosis. Aortic  valve mean gradient measures 10.5  mmHg.  Dimentionless index 0.31.   6. The inferior vena cava is normal in size with greater than 50%  respiratory variability, suggesting right atrial pressure of 3 mmHg.    Reproductive/Obstetrics negative OB ROS                              Anesthesia Physical Anesthesia Plan  ASA: 4 and emergent  Anesthesia Plan: General   Post-op Pain Management:    Induction:   PONV Risk Score and Plan: Propofol  infusion  Airway Management Planned:   Additional Equipment:   Intra-op Plan:   Post-operative Plan:   Informed Consent: I have reviewed the patients History and Physical, chart, labs and discussed the procedure including the risks, benefits and alternatives for the proposed anesthesia with the patient or authorized representative who has indicated his/her understanding and acceptance.     Dental Advisory Given  Plan Discussed with: CRNA  Anesthesia Plan Comments:          Anesthesia Quick Evaluation

## 2024-06-09 NOTE — Consult Note (Signed)
 Consultation Note Date: 06/09/2024   Patient Name: Stacey [name]  NAZANIN KINNER  DOB: Dec 29, 1945  MRN: [id number]  Age / Sex: 78 y.o., female  PCP: Rosamond Leta KATHEE, MD Referring Physician: Maree Adron BIRCH, DO  Reason for Consultation: Establishing goals of care  HPI/Patient Profile: 78 y.o. female  with past medical history of biliary stricture status post stenting, ascending cholangitis, breast cancer metastasized to bone-[facility], pathological L1 fracture, chronic pain with intrathecal pump (morphine /bupivacaine ), PE, atrial fibrillation, CAD with prior stenting, CKD stage IIIb, left-sided renal mass, COPD, anxiety, and depression  admitted on 06/06/2024 with acute pancreatitis.   Clinical Assessment and Goals of Care: I have reviewed medical records including EPIC notes, labs and imaging, received report from RN, assessed the patient.  Stacey [name] is sitting up quietly in bed.  She appears acutely/chronically ill and frail, obese.  She greets me, making and somewhat keeping eye contact.  She is alert and oriented, able to make her needs known.  There is no family at bedside at this time.  Stacey [name]'s lunch tray is in front of her and she is setting up her meal.  We meet at the bedside to discuss diagnosis prognosis, GOC, EOL wishes, disposition and options. I introduced Palliative Medicine as specialized medical care for people living with serious illness. It focuses on providing relief from the symptoms and stress of a serious illness. The goal is to improve quality of life for both the patient and the family.  We then focused on their current illness.  We talked about her acute pancreatitis and this morning's testing.  We talked about possible findings and recommendations for Endoscopic Ultrasound-guided Fine Needle Aspiration.  Stacey [name] states that she would certainly accept any further testing that was  needed/offered and would also continue to take cancer treatments as needed/offered.  She seems surprised that I question her about this choice.  The natural disease trajectory and expectations at EOL were discussed.  Advanced directives, concepts specific to code status, artifical feeding and hydration, and rehospitalization were considered and discussed.  DNR verified  Palliative Care services outpatient were explained and offered.  Stacey [name] is active with [facility] and they are to continue discussions with family about palliative/hospice care.     We discussed the importance of continued conversation with family and the medical providers regarding overall plan of care and treatment options, ensuring decisions are within the context of the patient's values and GOCs.  Questions and concerns were addressed.  Hard Choices booklet left for review. The family was encouraged to call with questions or concerns.  PMT will continue to support holistically.  Conference with attending, GI service, transition of care team, bedside nursing staff related to patient condition, needs, goals of care.    HCPOA  NEXT OF KIN -Stacey [name] states that her son, Stacey [name] and daughter Stacey [name] who lives in [place]  would make healthcare choices as a team.    SUMMARY OF RECOMMENDATIONS   Continue to treat the treatable but no CPR or intubation  Agreeable to Endoscopic Ultrasound-guided Fine Needle Aspiration  Agreeable to further cancer treatment as offered Home with the benefits of [facility] to continue home health  [facility] also to continue outpatient palliative and hospice care discussions   Code Status/Advance Care Planning: DNR  Symptom Management:  Per hospitalist  Palliative Prophylaxis:  Frequent Pain Assessment, Oral Care, and Turn Reposition  Additional Recommendations (Limitations, Scope, Preferences): Continue to treat but no CPR or intubation continue cancer treatments as  offered  Psycho-social/Spiritual:  Desire for further Chaplaincy support:no Additional Recommendations: Caregiving  Support/Resources and Education on Hospice  Prognosis:  < 6 months, would not be surprising based on chronic illness burden, cancer burden, poor mobility, bedbound status  Discharge Planning: Home with Home Health      Primary Diagnoses: Present on Admission:  Acute pancreatitis  Anxiety and depression  Atrial fibrillation, chronic (HCC)  Biliary stricture  COPD (chronic obstructive pulmonary disease) (HCC)  HLD (hyperlipidemia)  HTN (hypertension)  Liver lesion  Metastasis to bone (HCC)   I have reviewed the medical record, interviewed the patient and family, and examined the patient. The following aspects are pertinent.  Past Medical History:  Diagnosis Date   CAD in native artery    a. DES to ramus 2005 with late stent thrombosis 2006 tx with PTCA. 12/18 PCI/DES x1 to mRCA, EF 50-55%   CHF (congestive heart failure) (HCC)    Chronic pain    COPD (chronic obstructive pulmonary disease) (HCC)    Hyperlipidemia    Hypertension    Left bundle branch block    Lymphedema    Metastatic breast cancer    a. to bone.   MI (myocardial infarction) (HCC)    Mild aortic stenosis 10/2017   Morbid obesity (HCC)    PAF (paroxysmal atrial fibrillation) (HCC)    PSVT (paroxysmal supraventricular tachycardia) (HCC)    a. per [facility] notes, seen on event monitor in 2014.   Pulmonary nodules    Social History   Socioeconomic History   Marital status: Single    Spouse name: Not on file   Number of children: Not on file   Years of education: Not on file   Highest education level: Not on file  Occupational History   Occupation: Retired  Tobacco Use   Smoking status: Former    Current packs/day: 0.00    Average packs/day: 2.0 packs/day for 18.0 years (36.0 ttl pk-yrs)    Types: Cigarettes    Start date: 12/01/1962    Quit date: 12/01/1980    Years since quitting:  43.5   Smokeless tobacco: Never  Vaping Use   Vaping status: Never Used  Substance and Sexual Activity   Alcohol  use: No   Drug use: Never   Sexual activity: Not Currently  Other Topics Concern   Not on file  Social History Narrative   Not on file   Social Drivers of Health   Financial Resource Strain: High Risk (01/04/2024)   Received from [facility]   Overall Financial Resource Strain (CARDIA)    Difficulty of Paying Living Expenses: Hard  Food Insecurity: No Food Insecurity (06/07/2024)   Hunger Vital Sign    Worried About Running Out of Food in the Last Year: Never true    Ran Out of Food in the Last Year: Never true  Transportation Needs: No Transportation Needs (06/07/2024)   PRAPARE - Administrator, Civil Service (Medical): No    Lack of Transportation (Non-Medical): No  Physical Activity:  Inactive (08/17/2023)   Received from [facility]   Exercise Vital Sign    On average, how many days per week do you engage in moderate to strenuous exercise (like a brisk walk)?: 0 days    On average, how many minutes do you engage in exercise at this level?: 0 min  Stress: No Stress Concern Present (08/17/2023)   Received from [facility] - Occupational Stress Questionnaire    Feeling of Stress : Only a little  Social Connections: Unknown (06/07/2024)   Social Connection and Isolation Panel    Frequency of Communication with Friends and Family: More than three times a week    Frequency of Social Gatherings with Friends and Family: More than three times a week    Attends Religious Services: More than 4 times per year    Active Member of [employer] or Organizations: No    Attends Engineer, structural: More than 4 times per year    Marital Status: Patient declined  Recent Concern: Social Connections - Socially Isolated (03/09/2024)   Social Connection and Isolation Panel     Frequency of Communication with Friends and Family: More than three times a week    Frequency of Social Gatherings with Friends and Family: More than three times a week    Attends Religious Services: Never    Database administrator or Organizations: No    Attends Banker Meetings: Never    Marital Status: Widowed   Family History  Problem Relation Age of Onset   CAD Father 81   Heart attack Father    COPD Sister    CAD Paternal Grandmother    Sudden Cardiac Death Neg Hx    Colon polyps Neg Hx    Colon cancer Neg Hx    Scheduled Meds:  DULoxetine   30 mg Oral BID   [START ON 06/10/2024] enoxaparin  (LOVENOX ) injection  40 mg Subcutaneous Q24H   ferrous sulfate   325 mg Oral Q breakfast   metoprolol  succinate  75 mg Oral Daily   pantoprazole   40 mg Oral BID   polyethylene glycol  17 g Oral BID   senna-docusate  2 tablet Oral QHS   Continuous Infusions: PRN Meds:.acetaminophen  **OR** acetaminophen , ALPRAZolam , artificial tears, methocarbamol , morphine  injection, ondansetron  (ZOFRAN ) IV Medications Prior to Admission:  Prior to Admission medications   Medication Sig Start Date End Date Taking? Authorizing Provider  ALPRAZolam  (XANAX ) 0.5 MG tablet Take 1 tablet (0.5 mg total) by mouth 2 (two) times daily as needed for anxiety or sleep. 05/20/24  Yes Emokpae, Courage, MD  apixaban  (ELIQUIS ) 2.5 MG TABS tablet Take 2.5 mg by mouth daily.   Yes [provider]  b complex vitamins capsule Take 1 capsule by mouth daily.   Yes [provider]  carboxymethylcellulose (REFRESH PLUS) 0.5 % SOLN Place 1 drop into both eyes 3 (three) times daily as needed (Dry Eyes).   Yes [provider]  clopidogrel  (PLAVIX ) 75 MG tablet Take 75 mg by mouth daily.   Yes [provider]  DULoxetine  (CYMBALTA ) 30 MG capsule Take 30 mg by mouth 2 (two) times daily.   Yes [provider]  HYDROmorphone  (DILAUDID ) 2 MG tablet Take 2 mg by mouth every 6 (six)  hours as needed for severe pain (pain score 7-10). Via pump   Yes [provider]  methocarbamol  (ROBAXIN ) 500 MG tablet Take 500 mg by mouth every 6 (six)  hours as needed for muscle spasms.   Yes [provider]  metoprolol  succinate (TOPROL -XL) 25 MG 24 hr tablet Take 3 tablets (75 mg total) by mouth daily. 03/19/24 06/07/24 Yes Trudy Anthony HERO, MD  nitroGLYCERIN  (NITROSTAT ) 0.4 MG SL tablet Place 0.4 mg under the tongue every 5 (five) minutes as needed for chest pain.   Yes [provider]  ondansetron  (ZOFRAN ) 4 MG tablet Take 4 mg by mouth every 8 (eight) hours as needed for nausea or vomiting.   Yes [provider]  pantoprazole  (PROTONIX ) 40 MG tablet Take 1 tablet (40 mg total) by mouth 2 (two) times daily. 05/20/24 05/20/25 Yes Emokpae, Courage, MD  polyethylene glycol (MIRALAX  / GLYCOLAX ) 17 g packet Take 17 g by mouth 2 (two) times daily. 05/20/24  Yes Emokpae, Courage, MD  sucralfate  (CARAFATE ) 1 GM/10ML suspension Take 10 mLs (1 g total) by mouth 4 (four) times daily -  before meals and at bedtime. Patient not taking: Reported on 06/07/2024 05/20/24   Pearlean Manus, MD   Allergies  Allergen Reactions   Albuterol  Other (See Comments)    Heart racing    Brilinta  [Ticagrelor ] Diarrhea and Nausea Only    Nausea and severe diarrhea- general weakness. Patient says does not want to take again   Budesonide  Palpitations   Review of Systems  Unable to perform ROS: Acuity of condition    Physical Exam Vitals and nursing note reviewed.  Constitutional:      General: She is not in acute distress.    Appearance: She is obese. She is ill-appearing.  Pulmonary:     Effort: Pulmonary effort is normal. No respiratory distress.  Skin:    General: Skin is warm and dry.  Neurological:     Mental Status: She is alert and oriented to person, place, and time.  Psychiatric:        Mood and Affect: Mood normal.        Behavior: Behavior normal.     Vital  Signs: BP 137/67 (BP Location: Right Arm)   Pulse 92   Temp 98.1 F (36.7 C) (Oral)   Resp 16   Ht 5' (1.524 m)   Wt 79 kg   SpO2 95%   BMI 34.01 kg/m  Pain Scale: 0-10   Pain Score: 5    SpO2: SpO2: 95 % O2 Device:SpO2: 95 % O2 Flow Rate: .   IO: Intake/output summary:  Intake/Output Summary (Last 24 hours) at 06/09/2024 1001 Last data filed at 06/09/2024 0905 Gross per 24 hour  Intake 100 ml  Output 1450 ml  Net -1350 ml    LBM: Last BM Date : 06/07/24 Baseline Weight: Weight: 79 kg Most recent weight: Weight: 79 kg     Palliative Assessment/Data:     Time In: 1030  Time Out: 1125 Time Total: 55 minutes  Greater than 50%  of this time was spent counseling and coordinating care related to the above assessment and plan.  Signed by: Lorenza DELENA Birkenhead, NP   Please contact Palliative Medicine Team phone at [phone] for questions and concerns.  For individual provider: See Tracey

## 2024-06-09 NOTE — Telephone Encounter (Signed)
 Hi Ann ,   Can you please arrange a referral for this patient to Dr Wilhelmenia for EUS   Diagnosis: Pancreatic lesion    Thanks,   Edwyna Dangerfield Faizan Deen Deguia, MD Gastroenterology and Hepatology [facility]

## 2024-06-09 NOTE — Op Note (Addendum)
 [facility] Patient Name: Stacey [name] Procedure Date: 06/09/2024 8:33 AM MRN: [id number] Date of Birth: 1946/11/25 Attending MD: Deatrice Dine , MD, [id number] CSN: [id number] Age: 78 Admit Type: Inpatient Procedure:                Upper GI endoscopy Indications:              Epigastric abdominal pain, Melena Providers:                Deatrice Dine, MD, Rosina Sprague, Madelin Hunter, RN,                            Daphne Mulch Technician, Technician Referring MD:              Medicines:                Monitored Anesthesia Care Complications:            No immediate complications. Estimated Blood Loss:     Estimated blood loss was minimal. Procedure:                Pre-Anesthesia Assessment:                           - Prior to the procedure, a History and Physical                            was performed, and patient medications and                            allergies were reviewed. The patient's tolerance of                            previous anesthesia was also reviewed. The risks                            and benefits of the procedure and the sedation                            options and risks were discussed with the patient.                            All questions were answered, and informed consent                            was obtained. Prior Anticoagulants: The patient has                            taken Eliquis  (apixaban ), last dose was 2 days                            prior to procedure. ASA Grade Assessment: III - A                            patient with severe systemic disease. After  reviewing the risks and benefits, the patient was                            deemed in satisfactory condition to undergo the                            procedure.                           After obtaining informed consent, the endoscope was                            passed under direct vision. Throughout the                            procedure, the  patient's blood pressure, pulse, and                            oxygen  saturations were monitored continuously. The                            [id number]-H190 ([id number]) scope was introduced through the                            mouth, and advanced to the second part of duodenum.                            The upper GI endoscopy was accomplished without                            difficulty. The patient tolerated the procedure                            well. Scope In: 8:58:53 AM Scope Out: 9:05:38 AM Total Procedure Duration: 0 hours 6 minutes 45 seconds  Findings:      Mucosal changes characterized by sloughing were found at the       gastroesophageal junction. Biopsies were taken with a cold forceps for       histology.      Diffuse mild inflammation characterized by erythema was found in the       entire examined stomach. Biopsies were taken with a cold forceps for       histology.      There is no endoscopic evidence of bleeding or ulceration in the       duodenal bulb and in the second portion of the duodenum.      A previously placed metal stent was seen in the second portion of the       duodenum. Impression:               - Mucosal changes in the esophagus. Biopsied.                           - Gastritis. Biopsied.                           - Metal  stent in the duodenum.                           No evidence of active or old bleeding seen                            throughout the exam Moderate Sedation:      Per Anesthesia Care Recommendation:           Follow up path results                           PPI daily                           Palliative medicine consult to establish goals of                            care (GOC) ; EUS/FNA as outpatient for new                            pancreatic lesion depending on GOC                           No absolute GI contraindication to restart                            clinically indicated anticoagulation while patient                             is closely monintored                           Kidney mass management as per oncology /urology Procedure Code(s):        --- Professional ---                           [phone], Esophagogastroduodenoscopy, flexible,                            transoral; with biopsy, single or multiple Diagnosis Code(s):        --- Professional ---                           K22.89, Other specified disease of esophagus                           K29.70, Gastritis, unspecified, without bleeding                           R10.13, Epigastric pain                           K92.1, Melena (includes Hematochezia) CPT copyright 2022 American Medical Association. All rights reserved. The codes documented in this report are preliminary and upon coder review may  be revised to meet current compliance requirements. Deatrice Dine, MD Deatrice Dine, MD 06/09/2024 9:17:05 AM This report has been  signed electronically. Number of Addenda: 0

## 2024-06-09 NOTE — TOC Transition Note (Signed)
 Transition of Care [facility]) - Discharge Note   Patient Details  Name: Stacey [name] MRN: [id number] Date of Birth: 10-13-1946  Transition [facility]) CM/SW Contact:  Sharlyne Stabs, RN Phone Number: 06/09/2024, 1:29 PM   Clinical Narrative:   Palliative consult completed. Patient wanting to discharge back home. Some follow up testing will be ordered. She is active with [facility], MD is placing resumption orders. She will continue discussions with family about palliative/ Hospice.  Darice with [facility] updated.     Final next level of care: Home w Home Health Services Barriers to Discharge: Continued Medical Work up   Patient Goals and CMS Choice Patient states their goals for this hospitalization and ongoing recovery are:: return back home CMS Medicare.gov Compare Post Acute Care list provided to:: Patient Represenative (must comment) (Son- [name])   [facility] ownership interest in [facility].provided to:: Adult Children    Discharge Placement                    Patient and family notified of of transfer: 06/09/24  Discharge Plan and Services Additional resources added to the After Visit Summary for   In-house Referral: Clinical Social Work   Post Acute Care Choice: Durable Medical Equipment, Home Health                Social Drivers of Health (SDOH) Interventions SDOH Screenings   Food Insecurity: No Food Insecurity (06/07/2024)  Housing: Low Risk  (06/07/2024)  Transportation Needs: No Transportation Needs (06/07/2024)  Utilities: Not At Risk (06/07/2024)  Financial Resource Strain: High Risk (01/04/2024)   [facility] from [facility]  Physical Activity: Inactive (08/17/2023)   Received from [facility]  Social Connections: Unknown (06/07/2024)  Recent Concern: Social Connections - Socially Isolated (03/09/2024)  Stress: No Stress Concern Present (08/17/2023)   Received from [facility]  Tobacco Use: Medium Risk  (06/09/2024)  Health Literacy: Inadequate Health Literacy (08/17/2023)   [facility] from [facility]     Readmission Risk Interventions    06/08/2024   11:19 AM 05/20/2024    9:15 AM 05/19/2024    2:01 PM  Readmission Risk Prevention Plan  Transportation Screening Complete Complete Complete  Medication Review Oceanographer) Complete Complete Complete  HRI or Home Care Consult Complete Complete Complete  SW Recovery Care/Counseling Consult Complete Complete Complete  Palliative Care Screening Not Applicable Not Applicable Not Applicable  Skilled Nursing Facility Not Applicable Not Applicable Not Applicable

## 2024-06-09 NOTE — TOC Progression Note (Signed)
 Transition of Care [facility]) - Progression Note    Patient Details  Name: Stacey [name] MRN: [id number] Date of Birth: 1946-11-20  Transition of Care [facility]) CM/SW Contact  Sharlyne Stabs, RN Phone Number: 06/09/2024, 11:31 AM  Clinical Narrative:    Palliative continuing discussions. Patient is active with  [facility].  TOC following.     Expected Discharge Plan: Home/Self Care Barriers to Discharge: Continued Medical Work up  Expected Discharge Plan and Services In-house Referral: Clinical Social Work   Post Acute Care Choice: Horticulturist, commercial, Home Health Living arrangements for the past 2 months: Single Family Home                                       Social Determinants of Health (SDOH) Interventions SDOH Screenings   Food Insecurity: No Food Insecurity (06/07/2024)  Housing: Low Risk  (06/07/2024)  Transportation Needs: No Transportation Needs (06/07/2024)  Utilities: Not At Risk (06/07/2024)  Financial Resource Strain: High Risk (01/04/2024)   Received from [facility]  Physical Activity: Inactive (08/17/2023)   Received from [facility]  Social Connections: Unknown (06/07/2024)  Recent Concern: Social Connections - Socially Isolated (03/09/2024)  Stress: No Stress Concern Present (08/17/2023)   Received from [facility]  Tobacco Use: Medium Risk (06/09/2024)  Health Literacy: Inadequate Health Literacy (08/17/2023)   Received from [facility]    Readmission Risk Interventions    06/08/2024   11:19 AM 05/20/2024    9:15 AM 05/19/2024    2:01 PM  Readmission Risk Prevention Plan  Transportation Screening Complete Complete Complete  Medication Review Oceanographer) Complete Complete Complete  HRI or Home Care Consult Complete Complete Complete  SW Recovery Care/Counseling Consult Complete Complete Complete  Palliative Care Screening Not Applicable Not Applicable Not  Applicable  Skilled Nursing Facility Not Applicable Not Applicable Not Applicable

## 2024-06-09 NOTE — Brief Op Note (Addendum)
 Patient underwent EGD under propofol  sedation.  Tolerated the procedure adequately.   FINDINGS:  - Mucosal changes in the esophagus. Biopsied. - Gastritis. Biopsied. - Metal stent in the duodenum. No evidence of active or old bleeding seen throughout the exam   RECOMMENDATIONS  Follow up path results   PPI daily   Palliative medicine consult to establish goals of care ( GOC) ; EUS/ FNA as outpatient for new pancreatic lesion depending on GOC   No absolute GI contraindication to restart clinically indicated anticoagulation while patient is closely monitored  Addendum: After palliative care discussion , patient wants everything to be done , will send in referral for possible EUS  Kidney mass management as per oncology   Percilla Tweten Faizan Brace Welte, MD Gastroenterology and Hepatology [facility]

## 2024-06-09 NOTE — Plan of Care (Signed)

## 2024-06-10 ENCOUNTER — Other Ambulatory Visit: Payer: Self-pay

## 2024-06-10 ENCOUNTER — Telehealth: Payer: Self-pay

## 2024-06-10 ENCOUNTER — Encounter (HOSPITAL_COMMUNITY): Payer: Self-pay | Admitting: Gastroenterology

## 2024-06-10 DIAGNOSIS — K869 Disease of pancreas, unspecified: Secondary | ICD-10-CM

## 2024-06-10 LAB — CANCER ANTIGEN 19-9: CA 19-9: 612 U/mL — ABNORMAL HIGH (ref 0–35)

## 2024-06-10 NOTE — Telephone Encounter (Signed)
-----   Message from [facility] sent at 06/10/2024 11:05 AM EDT ----- Odetta,  Try to get this patient scheduled of 7/31 @1345  for Upper EUS. I should still have my whole scheduled block that day. It will eat into inpatients but this individual has a possible malignancy so needs to try and get done. Let me know if it works. Thanks.  MFA, We are going to try and get this patient in on this date, otherwise, if you need this done sooner, then will need to send to [facility]. GM ----- Message ----- From: Anitra Odetta CROME, RN Sent: 06/09/2024   3:49 PM EDT To: Aloha Wilhelmenia Raddle., MD   ----- Message ----- From: Blanca Jenkins DEL Sent: 06/09/2024   3:11 PM EDT To: Odetta CROME Anitra, RN; Aloha Wilhelmenia Raddle., #  Walterine Odetta  Dr Cinderella just sent me another message stating this is urgent ----- Message ----- From: Anitra Odetta CROME, RN Sent: 06/09/2024   1:38 PM EDT To: Jenkins DEL Juanda Aloha Wilhelmenia Raddle., MD   ----- Message ----- From: Blanca Jenkins DEL Sent: 06/09/2024   1:33 PM EDT To: Odetta CROME Anitra, RN  Per Dr Cinderella, patient needs EUS by Dr Wilhelmenia -- Diagnosis: Pancreatic lesion. Thanks Jenkins

## 2024-06-10 NOTE — Telephone Encounter (Signed)
 EUS on 06/30/24 at 130 pm at [facility] with GM   Eliquis  - plavix  need prescriber    Left message on machine to call back

## 2024-06-10 NOTE — Anesthesia Postprocedure Evaluation (Signed)
 Anesthesia Post Note  Patient: Stacey [name]  Procedure(s) Performed: EGD (ESOPHAGOGASTRODUODENOSCOPY)  Patient location during evaluation: Phase II Anesthesia Type: General Level of consciousness: awake Pain management: pain level controlled Vital Signs Assessment: post-procedure vital signs reviewed and stable Respiratory status: spontaneous breathing and respiratory function stable Cardiovascular status: blood pressure returned to baseline and stable Postop Assessment: no headache and no apparent nausea or vomiting Anesthetic complications: no Comments: Late entry   No notable events documented.   Last Vitals:  Vitals:   06/09/24 0956 06/09/24 1309  BP: 137/67 133/74  Pulse: 92 94  Resp: 16 14  Temp: 36.7 C 36.7 C  SpO2: 95% 97%    Last Pain:  Vitals:   06/09/24 1309  TempSrc: Oral  PainSc:                  Yvonna JINNY Bosworth

## 2024-06-13 LAB — SURGICAL PATHOLOGY

## 2024-06-13 NOTE — Telephone Encounter (Signed)
 Left message on machine to call back

## 2024-06-16 ENCOUNTER — Encounter (INDEPENDENT_AMBULATORY_CARE_PROVIDER_SITE_OTHER): Payer: Self-pay | Admitting: *Deleted

## 2024-06-16 ENCOUNTER — Ambulatory Visit (INDEPENDENT_AMBULATORY_CARE_PROVIDER_SITE_OTHER): Admitting: Gastroenterology

## 2024-06-16 ENCOUNTER — Encounter (INDEPENDENT_AMBULATORY_CARE_PROVIDER_SITE_OTHER): Payer: Self-pay | Admitting: Gastroenterology

## 2024-06-16 VITALS — BP 137/67 | HR 93 | Temp 97.1°F | Ht 60.0 in

## 2024-06-16 DIAGNOSIS — K8689 Other specified diseases of pancreas: Secondary | ICD-10-CM

## 2024-06-16 DIAGNOSIS — K859 Acute pancreatitis without necrosis or infection, unspecified: Secondary | ICD-10-CM

## 2024-06-16 DIAGNOSIS — R932 Abnormal findings on diagnostic imaging of liver and biliary tract: Secondary | ICD-10-CM

## 2024-06-16 DIAGNOSIS — N2889 Other specified disorders of kidney and ureter: Secondary | ICD-10-CM

## 2024-06-16 NOTE — Patient Instructions (Addendum)
 Reach [facility] regarding wound care. Proceed with scheduled EUS at [facility] Close follow-up with oncology Please continue close discussions regarding goals of care with palliative care

## 2024-06-16 NOTE — Progress Notes (Unsigned)
 Toribio Fortune, M.D. Gastroenterology & Hepatology [facility] [address] [place], [place] [address]  Primary Care Physician: Rosamond Leta NOVAK, MD [address] [place] [address]  I will communicate my assessment and recommendations to the referring MD via EMR.  Problems: Possible pancreatic cancer  History of Present Illness: Stacey [name] is a 78 y.o. female who presents for follow up of ***  The patient was last seen on ***. At that time, the patient ***.  *** CA 19-9.  Notably, during her evaluation by palliative care, she was agreeable to proceed with EUS as outpatient and to obtain further cancer treatment.  Also wanted to have ongoing discussions regarding palliative and hospice care discussions as outpatient.  Currently DNR.  Patient reports that she is not feeling well, as she is having foot and shoulder pain. States her abdominal pain is gone and she is not having any more abdominal pain. She is having a BM almost every day with the use of Miralax  BID and Sennokot. She is taking Dilaudid  every 6 hours for pain control.  The patient denies having any nausea, vomiting, fever, chills, hematochezia, melena, hematemesis, abdominal distention,  diarrhea, jaundice, pruritus. Patient reports she is eating well, state she may have gained a couple of lb.  Last EGD: *** Last Colonoscopy:  Past Medical History: Past Medical History:  Diagnosis Date   CAD in native artery    a. DES to ramus 2005 with late stent thrombosis 2006 tx with PTCA. 12/18 PCI/DES x1 to mRCA, EF 50-55%   CHF (congestive heart failure) (HCC)    Chronic pain    COPD (chronic obstructive pulmonary disease) (HCC)    Hyperlipidemia    Hypertension    Left bundle branch block    Lymphedema    Metastatic breast cancer    a. to bone.   MI (myocardial infarction) (HCC)    Mild aortic stenosis 10/2017   Morbid obesity (HCC)    PAF (paroxysmal atrial  fibrillation) (HCC)    PSVT (paroxysmal supraventricular tachycardia) (HCC)    a. per [facility] notes, seen on event monitor in 2014.   Pulmonary nodules     Past Surgical History: Past Surgical History:  Procedure Laterality Date   BILIARY STENT PLACEMENT  03/09/2024   Procedure: INSERTION, STENT, BILE DUCT;  Surgeon: Jinny Carmine, MD;  Location: [facility] ENDOSCOPY;  Service: Endoscopy;;   CHOLECYSTECTOMY N/A 03/29/2021   Procedure: LAPAROSCOPIC CHOLECYSTECTOMY;  Surgeon: Kallie Manuelita BROCKS, MD;  Location: [facility] ORS;  Service: General;  Laterality: N/A;   CORONARY STENT INTERVENTION N/A 11/26/2017   Procedure: CORONARY STENT INTERVENTION;  Surgeon: Swaziland, Peter M, MD;  Location: [facility] INVASIVE CV LAB;  Service: Cardiovascular;  Laterality: N/A;   CORONARY STENT PLACEMENT     ERCP N/A 03/28/2021   Procedure: ENDOSCOPIC RETROGRADE CHOLANGIOPANCREATOGRAPHY (ERCP);  Surgeon: Golda Claudis PENNER, MD;  Location: [facility] ORS;  Service: Endoscopy;  Laterality: N/A;   ERCP N/A 03/09/2024   Procedure: ERCP, WITH INTERVENTION IF INDICATED;  Surgeon: Jinny Carmine, MD;  Location: [facility] ENDOSCOPY;  Service: Endoscopy;  Laterality: N/A;   ERCP N/A 04/15/2024   Procedure: ERCP, WITH INTERVENTION IF INDICATED;  Surgeon: Jinny Carmine, MD;  Location: [facility] ENDOSCOPY;  Service: Endoscopy;  Laterality: N/A;   ESOPHAGOGASTRODUODENOSCOPY  03/09/2024   Procedure: EGD (ESOPHAGOGASTRODUODENOSCOPY);  Surgeon: Jinny Carmine, MD;  Location: [facility] ENDOSCOPY;  Service: Endoscopy;;   ESOPHAGOGASTRODUODENOSCOPY N/A 06/09/2024   Procedure: EGD (ESOPHAGOGASTRODUODENOSCOPY);  Surgeon: Cinderella Deatrice FALCON, MD;  [facility];  Service: Endoscopy;  Laterality: N/A;   intrathecal pain pump     LEFT HEART CATH AND CORONARY ANGIOGRAPHY N/A 11/26/2017   Procedure: LEFT HEART CATH AND CORONARY ANGIOGRAPHY;  Surgeon: Swaziland, Peter M, MD;  Location: [facility] INVASIVE CV LAB;  Service: Cardiovascular;  Laterality: N/A;   REMOVAL OF STONES  03/28/2021    Procedure: REMOVAL OF STONES;  Surgeon: Golda Claudis PENNER, MD;  Location: [facility];  Service: Endoscopy;;   SPHINCTEROTOMY  03/28/2021   Procedure: SPHINCTEROTOMY;  Surgeon: Golda Claudis PENNER, MD;  Location: [facility];  Service: Endoscopy;;   UMBILICAL HERNIA REPAIR N/A 04/03/2021   Procedure: ADULT PRIMARY UMBILICAL HERNIA REPAIR;  Surgeon: Kallie Manuelita BROCKS, MD;  Location: [facility] ORS;  Service: General;  Laterality: N/A;   VASCULAR SURGERY      Family History: Family History  Problem Relation Age of Onset   CAD Father 25   Heart attack Father    COPD Sister    CAD Paternal Grandmother    Sudden Cardiac Death Neg Hx    Colon polyps Neg Hx    Colon cancer Neg Hx     Social History: Social History   Tobacco Use  Smoking Status Former   Current packs/day: 0.00   Average packs/day: 2.0 packs/day for 18.0 years (36.0 ttl pk-yrs)   Types: Cigarettes   Start date: 12/01/1962   Quit date: 12/01/1980   Years since quitting: 43.5  Smokeless Tobacco Never   Social History   Substance and Sexual Activity  Alcohol  Use No   Social History   Substance and Sexual Activity  Drug Use Never    Allergies: Allergies  Allergen Reactions   Albuterol  Other (See Comments)    Heart racing    Brilinta  [Ticagrelor ] Diarrhea and Nausea Only    Nausea and severe diarrhea- general weakness. Patient says does not want to take again   Budesonide  Palpitations    Medications: Current Outpatient Medications  Medication Sig Dispense Refill   ALPRAZolam  (XANAX ) 0.5 MG tablet Take 1 tablet (0.5 mg total) by mouth 2 (two) times daily as needed for anxiety or sleep. 30 tablet 0   apixaban  (ELIQUIS ) 2.5 MG TABS tablet Take 2.5 mg by mouth daily.     b complex vitamins capsule Take 1 capsule by mouth daily.     carboxymethylcellulose (REFRESH PLUS) 0.5 % SOLN Place 1 drop into both eyes 3 (three) times daily as needed (Dry Eyes).     clopidogrel  (PLAVIX ) 75 MG tablet Take 75 mg by mouth daily.     DULoxetine   (CYMBALTA ) 30 MG capsule Take 30 mg by mouth 2 (two) times daily.     ferrous sulfate  325 (65 FE) MG EC tablet Take 1 tablet (325 mg total) by mouth daily with breakfast. 30 tablet 3   HYDROmorphone  (DILAUDID ) 2 MG tablet Take 2 mg by mouth every 6 (six) hours as needed for severe pain (pain score 7-10). Via pump     methocarbamol  (ROBAXIN ) 500 MG tablet Take 500 mg by mouth every 6 (six) hours as needed for muscle spasms.     metoprolol  succinate (TOPROL -XL) 25 MG 24 hr tablet Take 3 tablets (75 mg total) by mouth daily. 90 tablet 0   nitroGLYCERIN  (NITROSTAT ) 0.4 MG SL tablet Place 0.4 mg under the tongue every 5 (five) minutes as needed for chest pain.     ondansetron  (ZOFRAN ) 4 MG tablet Take 4 mg by mouth every 8 (eight) hours as needed for nausea or  vomiting.     pantoprazole  (PROTONIX ) 40 MG tablet Take 1 tablet (40 mg total) by mouth 2 (two) times daily. 60 tablet 5   polyethylene glycol (MIRALAX  / GLYCOLAX ) 17 g packet Take 17 g by mouth 2 (two) times daily. 180 each 3   senna-docusate (SENOKOT-S) 8.6-50 MG tablet Take 2 tablets by mouth at bedtime. 180 tablet 3   sucralfate  (CARAFATE ) 1 GM/10ML suspension Take 10 mLs (1 g total) by mouth 4 (four) times daily -  before meals and at bedtime. 1260 each 0   No current facility-administered medications for this visit.    Review of Systems: GENERAL: negative for malaise, night sweats HEENT: No changes in hearing or vision, no nose bleeds or other nasal problems. NECK: Negative for lumps, goiter, pain and significant neck swelling RESPIRATORY: Negative for cough, wheezing CARDIOVASCULAR: Negative for chest pain, leg swelling, palpitations, orthopnea GI: SEE HPI MUSCULOSKELETAL: Negative for joint pain or swelling, back pain, and muscle pain. SKIN: Negative for lesions, rash PSYCH: Negative for sleep disturbance, mood disorder and recent psychosocial stressors. HEMATOLOGY Negative for prolonged bleeding, bruising easily, and swollen  nodes. ENDOCRINE: Negative for cold or heat intolerance, polyuria, polydipsia and goiter. NEURO: negative for tremor, gait imbalance, syncope and seizures. The remainder of the review of systems is noncontributory.   Physical Exam: BP 137/67 (BP Location: Left Arm, Patient Position: Sitting, Cuff Size: Large)   Pulse 93   Temp (!) 97.1 F (36.2 C) (Temporal)   Ht 5' (1.524 m)   BMI 34.01 kg/m  GENERAL: The patient is AO x3, in no acute distress.  Patient is laying in the stretcher. HEENT: Head is normocephalic and atraumatic. EOMI are intact. Mouth is well hydrated and without lesions. NECK: Supple. No masses LUNGS: Clear to auscultation. No presence of rhonchi/wheezing/rales. Adequate chest expansion HEART: RRR, normal s1 and s2. ABDOMEN: Soft, nontender, no guarding, no peritoneal signs, and nondistended. BS +. No masses. EXTREMITIES: Without any cyanosis, clubbing, rash, lesions +1 pitting edema in both legs.  Has a right ankle pressure ulcer stage I NEUROLOGIC: AOx3, no focal motor deficit. SKIN: no jaundice, no rashes  Imaging/Labs: as above  I personally reviewed and interpreted the available labs, imaging and endoscopic files.  Impression and Plan: Imaan  B Casebeer is a 78 y.o. female coming for follow up of ***  Will hold off on kidney cancer referral.  All questions were answered.      Toribio Fortune, MD Gastroenterology and Hepatology [facility]

## 2024-06-16 NOTE — Telephone Encounter (Signed)
 Error   This encounter was created in error - please disregard.

## 2024-06-17 ENCOUNTER — Other Ambulatory Visit: Payer: Self-pay | Admitting: Cardiology

## 2024-06-21 NOTE — Telephone Encounter (Signed)
 Spoke with the pt and her son. The MD that prescribes Eliquis  is Vyas and Plavix  is Risk manager. Will send request to hold.

## 2024-06-22 ENCOUNTER — Telehealth: Payer: Self-pay

## 2024-06-22 ENCOUNTER — Telehealth: Payer: Self-pay | Admitting: Gastroenterology

## 2024-06-22 NOTE — Telephone Encounter (Signed)
-----   Message from Alvan Carrier sent at 06/22/2024  9:19 AM EDT ----- Manuelita to hold plavix  starting 5 days prior to procedure. She is on eliquis , would typiclaly hold 2 days prior to procedure. Resume both the day after procedure  JINNY Alvan MD ----- Message ----- From: Anitra Odetta CROME, RN Sent: 06/21/2024   9:49 AM EDT To: Carrier JULIANNA Alvan, MD

## 2024-06-22 NOTE — Telephone Encounter (Signed)
 Procedure:Upper EUS Procedure date: 06/30/24 Procedure location: [facility] Arrival Time: 12:00 pm Spoke with the patient Y/N: Yes Any prep concerns? No  Has the patient obtained the prep from the pharmacy ? No prep needed Do you have a care partner and transportation: pt states EMS is supposed to take her to the procedure appt, I told patient that she needed a care partner with her but to ask the hospital their requirements when they call her. Any additional concerns? No

## 2024-06-24 ENCOUNTER — Other Ambulatory Visit: Payer: Self-pay

## 2024-06-24 ENCOUNTER — Encounter (HOSPITAL_COMMUNITY): Payer: Self-pay | Admitting: Gastroenterology

## 2024-06-24 MED ORDER — METOPROLOL SUCCINATE ER 25 MG PO TB24: 75.0000 mg | ORAL_TABLET | Freq: Every day | ORAL | 2 refills | Status: AC

## 2024-06-24 NOTE — Progress Notes (Signed)
 Attempted to obtain medical history for pre op call via telephone, unable to reach at this time. HIPAA compliant voicemail message left requesting return call to pre surgical testing department.

## 2024-06-27 ENCOUNTER — Ambulatory Visit (INDEPENDENT_AMBULATORY_CARE_PROVIDER_SITE_OTHER): Payer: Self-pay | Admitting: Gastroenterology

## 2024-06-28 NOTE — Progress Notes (Signed)
 Pre op Carlene Zygmunt Fear MD Cardiologist-Branch MD Pulmonologist-n/a  EKG-05/30/24 Echo-03/25/21 [id number] Stress-n/a ICD/PM- n/a GLP1-n/a Blood Thinner-Plavix  hold 5 days, Eliquis  hold 2 days  HX:CHF,CAD,HTN,MI,LBBB, COPD, BRCA, Afib, PSVT, Aortic stenosis. Patient last saw her cards MD 01/13/24 with a f/u in 6 months. Per pt shes doing okay, no new issues, is still bedbound, her son helps with her care.  Has had anesthesia in may and july 11th which went fine.  Anesthesia Review- Yes- recent procedures okay, fine to proceed

## 2024-06-28 NOTE — Telephone Encounter (Signed)
 The pt has been advised to hold Eliquis  2 days prior to the procedure

## 2024-06-29 ENCOUNTER — Inpatient Hospital Stay

## 2024-06-29 DIAGNOSIS — Z1721 Progesterone receptor positive status: Secondary | ICD-10-CM | POA: Diagnosis not present

## 2024-06-29 DIAGNOSIS — Z170 Estrogen receptor positive status [ER+]: Secondary | ICD-10-CM | POA: Diagnosis not present

## 2024-06-29 DIAGNOSIS — Z5111 Encounter for antineoplastic chemotherapy: Secondary | ICD-10-CM | POA: Diagnosis present

## 2024-06-29 DIAGNOSIS — C50911 Malignant neoplasm of unspecified site of right female breast: Secondary | ICD-10-CM

## 2024-06-29 DIAGNOSIS — C50411 Malignant neoplasm of upper-outer quadrant of right female breast: Secondary | ICD-10-CM | POA: Diagnosis present

## 2024-06-29 DIAGNOSIS — C7951 Secondary malignant neoplasm of bone: Secondary | ICD-10-CM | POA: Diagnosis present

## 2024-06-29 LAB — CBC WITH DIFFERENTIAL/PLATELET
Abs Immature Granulocytes: 0.01 K/uL (ref 0.00–0.07)
Basophils Absolute: 0 K/uL (ref 0.0–0.1)
Basophils Relative: 0 %
Eosinophils Absolute: 0.1 K/uL (ref 0.0–0.5)
Eosinophils Relative: 4 %
HCT: 32.6 % — ABNORMAL LOW (ref 36.0–46.0)
Hemoglobin: 10.6 g/dL — ABNORMAL LOW (ref 12.0–15.0)
Immature Granulocytes: 0 %
Lymphocytes Relative: 26 %
Lymphs Abs: 1 K/uL (ref 0.7–4.0)
MCH: 34.3 pg — ABNORMAL HIGH (ref 26.0–34.0)
MCHC: 32.5 g/dL (ref 30.0–36.0)
MCV: 105.5 fL — ABNORMAL HIGH (ref 80.0–100.0)
Monocytes Absolute: 0.2 K/uL (ref 0.1–1.0)
Monocytes Relative: 6 %
Neutro Abs: 2.4 K/uL (ref 1.7–7.7)
Neutrophils Relative %: 64 %
Platelets: 192 K/uL (ref 150–400)
RBC: 3.09 MIL/uL — ABNORMAL LOW (ref 3.87–5.11)
RDW: 13.4 % (ref 11.5–15.5)
WBC: 3.7 K/uL — ABNORMAL LOW (ref 4.0–10.5)
nRBC: 0 % (ref 0.0–0.2)

## 2024-06-29 LAB — COMPREHENSIVE METABOLIC PANEL WITH GFR
ALT: 22 U/L (ref 0–44)
AST: 42 U/L — ABNORMAL HIGH (ref 15–41)
Albumin: 2.3 g/dL — ABNORMAL LOW (ref 3.5–5.0)
Alkaline Phosphatase: 208 U/L — ABNORMAL HIGH (ref 38–126)
Anion gap: 7 (ref 5–15)
BUN: 13 mg/dL (ref 8–23)
CO2: 27 mmol/L (ref 22–32)
Calcium: 7.9 mg/dL — ABNORMAL LOW (ref 8.9–10.3)
Chloride: 107 mmol/L (ref 98–111)
Creatinine, Ser: 0.49 mg/dL (ref 0.44–1.00)
GFR, Estimated: >60 mL/min (ref >60)
Glucose, Bld: 140 mg/dL — ABNORMAL HIGH (ref 70–99)
Potassium: 3.4 mmol/L — ABNORMAL LOW (ref 3.5–5.1)
Sodium: 141 mmol/L (ref 135–145)
Total Bilirubin: 0.8 mg/dL (ref 0.0–1.2)
Total Protein: 5.8 g/dL — ABNORMAL LOW (ref 6.5–8.1)

## 2024-06-29 MED ORDER — DENOSUMAB 120 MG/1.7ML ~~LOC~~ SOLN
120.0000 mg | Freq: Once | SUBCUTANEOUS | Status: AC
Start: 2024-06-29 — End: 2024-06-29
  Administered 2024-06-29: 120 mg via SUBCUTANEOUS
  Filled 2024-06-29: qty 1.7

## 2024-06-29 MED ORDER — FULVESTRANT 250 MG/5ML IM SOSY
500.0000 mg | PREFILLED_SYRINGE | Freq: Once | INTRAMUSCULAR | Status: AC
  Administered 2024-06-29: 500 mg via INTRAMUSCULAR
  Filled 2024-06-29: qty 10

## 2024-06-29 NOTE — Patient Instructions (Signed)
 [facility] - A DEPT OF [facility]  Discharge Instructions: Thank you for choosing [facility] to provide your oncology and hematology care.  If you have a lab appointment with the Cancer Center - please note that after April 8th, 2024, all labs will be drawn in the cancer center.  You do not have to check in or register with the main entrance as you have in the past but will complete your check-in in the cancer center.  Wear comfortable clothing and clothing appropriate for easy access to any Portacath or PICC line.   We strive to give you quality time with your provider. You may need to reschedule your appointment if you arrive late (15 or more minutes).  Arriving late affects you and other patients whose appointments are after yours.  Also, if you miss three or more appointments without notifying the office, you may be dismissed from the clinic at the provider's discretion.      For prescription refill requests, have your pharmacy contact our office and allow 72 hours for refills to be completed.    Today you received the following Xgeva  and Faslodex , return as scheduled.   To help prevent nausea and vomiting after your treatment, we encourage you to take your nausea medication as directed.  BELOW ARE SYMPTOMS THAT SHOULD BE REPORTED IMMEDIATELY: *FEVER GREATER THAN 100.4 F (38 C) OR HIGHER *CHILLS OR SWEATING *NAUSEA AND VOMITING THAT IS NOT CONTROLLED WITH YOUR NAUSEA MEDICATION *UNUSUAL SHORTNESS OF BREATH *UNUSUAL BRUISING OR BLEEDING *URINARY PROBLEMS (pain or burning when urinating, or frequent urination) *BOWEL PROBLEMS (unusual diarrhea, constipation, pain near the anus) TENDERNESS IN MOUTH AND THROAT WITH OR WITHOUT PRESENCE OF ULCERS (sore throat, sores in mouth, or a toothache) UNUSUAL RASH, SWELLING OR PAIN  UNUSUAL VAGINAL DISCHARGE OR ITCHING   Items with * indicate a potential emergency and should be followed up as soon as  possible or go to the Emergency Department if any problems should occur.  Please show the CHEMOTHERAPY ALERT CARD or IMMUNOTHERAPY ALERT CARD at check-in to the Emergency Department and triage nurse.  Should you have questions after your visit or need to cancel or reschedule your appointment, please contact [facility] [place] - A DEPT OF [facility] [phone]  and follow the prompts.  Office hours are 8:00 a.m. to 4:30 p.m. Monday - Friday. Please note that voicemails left after 4:00 p.m. may not be returned until the following business day.  We are closed weekends and major holidays. You have access to a nurse at all times for urgent questions. Please call the main number to the clinic [phone] and follow the prompts.  For any non-urgent questions, you may also contact your provider using MyChart. We now offer e-Visits for anyone 3 and older to request care online for non-urgent symptoms. For details visit [email or web address].   Also download the MyChart app! Go to the app store, search MyChart, open the app, select [place], and log in with your MyChart username and password.

## 2024-06-29 NOTE — Progress Notes (Signed)
 Patient okay for xgeva  per Dr. Rogers with Calcium  of 7.9. Patient taking calcium  as directed. Denied tooth, jaw, and leg pain. No recent or upcoming dental visits. Labs reviewed. Patient tolerated injection with no complaints voiced. See MAR for details. Patient stable during and after injection. Site clean and dry with no bruising or swelling noted. Band aid applied.  Patient tolerated Faslodex  injection with no complaints voiced. Bilateral sites clean and dry with no bruising or swelling noted. Band aids applied. See MAR for details. Patient stable during and after injections. VSS with discharge and left in satisfactory condition with no s/s of distress noted.

## 2024-06-29 NOTE — Progress Notes (Signed)
 Ok to proceed with Xgeva   today as corrected calcium =9.3.  Ronav Furney, PharmD, MBA

## 2024-06-30 ENCOUNTER — Encounter (HOSPITAL_COMMUNITY): Payer: Self-pay | Admitting: Gastroenterology

## 2024-06-30 ENCOUNTER — Ambulatory Visit (HOSPITAL_COMMUNITY)

## 2024-06-30 ENCOUNTER — Other Ambulatory Visit: Payer: Self-pay

## 2024-06-30 ENCOUNTER — Inpatient Hospital Stay

## 2024-06-30 ENCOUNTER — Ambulatory Visit (HOSPITAL_COMMUNITY)
Admission: RE | Admit: 2024-06-30 | Discharge: 2024-06-30 | Disposition: A | Discharge status: Home / Self Care | Attending: Gastroenterology | Admitting: Gastroenterology

## 2024-06-30 ENCOUNTER — Encounter (HOSPITAL_COMMUNITY)
Admission: RE | Disposition: A | Discharge status: Home / Self Care | Payer: Self-pay | Source: Home / Self Care | Attending: Gastroenterology

## 2024-06-30 DIAGNOSIS — J449 Chronic obstructive pulmonary disease, unspecified: Secondary | ICD-10-CM | POA: Insufficient documentation

## 2024-06-30 DIAGNOSIS — I509 Heart failure, unspecified: Secondary | ICD-10-CM | POA: Insufficient documentation

## 2024-06-30 DIAGNOSIS — G8929 Other chronic pain: Secondary | ICD-10-CM | POA: Insufficient documentation

## 2024-06-30 DIAGNOSIS — I110 Hypertensive heart disease with heart failure: Secondary | ICD-10-CM

## 2024-06-30 DIAGNOSIS — I2510 Atherosclerotic heart disease of native coronary artery without angina pectoris: Secondary | ICD-10-CM | POA: Diagnosis not present

## 2024-06-30 DIAGNOSIS — Z955 Presence of coronary angioplasty implant and graft: Secondary | ICD-10-CM | POA: Insufficient documentation

## 2024-06-30 DIAGNOSIS — E66813 Obesity, class 3: Secondary | ICD-10-CM | POA: Insufficient documentation

## 2024-06-30 DIAGNOSIS — M549 Dorsalgia, unspecified: Secondary | ICD-10-CM | POA: Diagnosis not present

## 2024-06-30 DIAGNOSIS — Z87891 Personal history of nicotine dependence: Secondary | ICD-10-CM | POA: Insufficient documentation

## 2024-06-30 DIAGNOSIS — K2289 Other specified disease of esophagus: Secondary | ICD-10-CM

## 2024-06-30 DIAGNOSIS — Z9689 Presence of other specified functional implants: Secondary | ICD-10-CM

## 2024-06-30 DIAGNOSIS — T182XXA Foreign body in stomach, initial encounter: Secondary | ICD-10-CM

## 2024-06-30 DIAGNOSIS — K2980 Duodenitis without bleeding: Secondary | ICD-10-CM | POA: Diagnosis not present

## 2024-06-30 DIAGNOSIS — I5033 Acute on chronic diastolic (congestive) heart failure: Secondary | ICD-10-CM | POA: Diagnosis not present

## 2024-06-30 DIAGNOSIS — Z978 Presence of other specified devices: Secondary | ICD-10-CM | POA: Diagnosis not present

## 2024-06-30 DIAGNOSIS — K8689 Other specified diseases of pancreas: Secondary | ICD-10-CM

## 2024-06-30 DIAGNOSIS — K869 Disease of pancreas, unspecified: Secondary | ICD-10-CM

## 2024-06-30 DIAGNOSIS — I252 Old myocardial infarction: Secondary | ICD-10-CM | POA: Insufficient documentation

## 2024-06-30 DIAGNOSIS — Z6837 Body mass index (BMI) 37.0-37.9, adult: Secondary | ICD-10-CM | POA: Diagnosis not present

## 2024-06-30 HISTORY — PX: EUS: SHX5427

## 2024-06-30 HISTORY — PX: ESOPHAGOGASTRODUODENOSCOPY: SHX5428

## 2024-06-30 LAB — CANCER ANTIGEN 15-3: CA 15-3: 22.8 U/mL (ref 0.0–25.0)

## 2024-06-30 LAB — CANCER ANTIGEN 27.29: CA 27.29: 31.5 U/mL (ref 0.0–38.6)

## 2024-06-30 SURGERY — EGD (ESOPHAGOGASTRODUODENOSCOPY)
Anesthesia: Monitor Anesthesia Care

## 2024-06-30 MED ORDER — PROPOFOL 10 MG/ML IV BOLUS
INTRAVENOUS | Status: DC | PRN
  Administered 2024-06-30 (×2): 50 mg via INTRAVENOUS

## 2024-06-30 MED ORDER — LACTATED RINGERS IV SOLN
INTRAVENOUS | Status: AC | PRN
  Administered 2024-06-30: 10 mL/h via INTRAVENOUS

## 2024-06-30 MED ORDER — PROPOFOL 500 MG/50ML IV EMUL
INTRAVENOUS | Status: DC | PRN
  Administered 2024-06-30: 175 ug/kg/min via INTRAVENOUS

## 2024-06-30 MED ORDER — FENTANYL CITRATE (PF) 100 MCG/2ML IJ SOLN
50.0000 ug | Freq: Once | INTRAMUSCULAR | Status: AC
  Administered 2024-06-30: 50 ug via INTRAVENOUS

## 2024-06-30 MED ORDER — PROPOFOL 10 MG/ML IV BOLUS
INTRAVENOUS | Status: AC
  Filled 2024-06-30: qty 20

## 2024-06-30 MED ORDER — FENTANYL CITRATE (PF) 100 MCG/2ML IJ SOLN
INTRAMUSCULAR | Status: AC
  Filled 2024-06-30: qty 2

## 2024-06-30 MED ORDER — PROPOFOL 1000 MG/100ML IV EMUL
INTRAVENOUS | Status: AC
  Filled 2024-06-30: qty 100

## 2024-06-30 MED ORDER — SODIUM CHLORIDE 0.9 % IV SOLN: INTRAVENOUS | Status: DC

## 2024-06-30 MED ORDER — LIDOCAINE 2% (20 MG/ML) 5 ML SYRINGE
INTRAMUSCULAR | Status: DC | PRN
  Administered 2024-06-30: 40 mg via INTRAVENOUS

## 2024-06-30 NOTE — Progress Notes (Signed)
 This patient was seen in the postprocedural recovery area and discussed with her sister. She is to be discharged this afternoon. Unfortunately, there seems to be a transportation issue with [employer] and [place] transportation. It is possible that she will not have access or availability to being brought back home via EMS. At this point in time, we will ask medicine service to see what they would be willing in regards to potential observation, once the endoscopy unit closes if necessary. She does have significant back pain issues for which she has an intrathecal pump.  She has required some narcotic medication postprocedure to try to get her comfortable and now she is more comfortable. She has chronic back pain. She is not having progressive abdominal pain at this time.  Will discuss with [facility] medicine potential observation if we cannot get patient to be taken home safely versus taken to the ED to wait until she can be taken home via EMS.   Aloha Finner, MD  Gastroenterology Advanced Endoscopy Office # [phone]

## 2024-06-30 NOTE — Transfer of Care (Signed)
 Immediate Anesthesia Transfer of Care Note  Patient: Stacey  B [name]  Procedure(s) Performed: EGD (ESOPHAGOGASTRODUODENOSCOPY)  Patient Location: PACU and Endoscopy Unit  Anesthesia Type:MAC  Level of Consciousness: drowsy  Airway & Oxygen  Therapy: Patient Spontanous Breathing and Patient connected to nasal cannula oxygen   Post-op Assessment: Report given to RN and Post -op Vital signs reviewed and stable  Post vital signs: Reviewed and stable  Last Vitals:  Vitals Value Taken Time  BP    Temp    Pulse    Resp    SpO2      Last Pain:  Vitals:   06/30/24 1249  TempSrc: Temporal  PainSc: 0-No pain         Complications: No notable events documented.

## 2024-06-30 NOTE — Discharge Instructions (Signed)

## 2024-06-30 NOTE — Progress Notes (Addendum)
 Pt presented to endo today for EUS with MD Mansouraty. Post-operatively, pt c/o 10/10 pain in her back. Pt has a hx of back pain and a pain pump at home. Pt tearful and moaning. Pt repositioned to the supine position. The patient endorsed some improvement with repositioning. Pt's sister [name] brought to bedside. MD Mansouraty notified and arrived at the bedside. VO for 25 mcg of fentanyl . MDA Woodrum notified and altered order to 50 mcg of fentanyl . MD Mansouraty notified and agreeable to change in orders. Medication given as ordered.  10 minutes post fentanyl  administration, pt denies improvement with fentanyl . Pt states she's never at a 0, but this pain is above baseline. Pt states pain is making it difficult to breathe. Pt's oxygen  saturation remains 100% on 2 LPM by [place].  MD Woodrum assessed pt at bedside. No new orders at this time.  Ozell VEAR Pouch, RN 06/30/24  Addendum 1635: Pt denies improvement in pain at this time, however, pt has noted improvement in moaning and tearfulness. MDA Woodrum and MD Mansouraty notified, okay for her to be transferred back to home. Currently awaiting EMS transport back to home.  Ozell VEAR Pouch, RN 06/30/24 4:39 PM  Addendum 1706: Pt was initially brought from home by [facility] for her EUS. When [facility] was called at 1615 to return the patient to home, they stated the patient was marked drop off only, no plans for return trip. Per [facility], they stop scheduled transports at 1500 and only have 911 service after that time. They recommended [facility] be called to transport the patient home. [facility] stated that they had 13 pending calls, which would place the patient 14th in line. MD Mansouraty was notified of this (see MD's note dated 06/30/24 1639). After discussion with [facility], [place], and [place], transportation was secured through [facility]. Transport to arrive at 1900. Pt taken to  PACU with sister to await transport.  Ozell VEAR Pouch, RN 06/30/24 5:13 PM

## 2024-06-30 NOTE — H&P (Signed)
 GASTROENTEROLOGY PROCEDURE H&P NOTE   Primary Care Physician: Rosamond Leta NOVAK, MD  HPI: Stacey [name] is a 78 y.o. female who presents for EGD/EUS to evaluate a pancreatic lesion in the NOP.    Past Medical History:  Diagnosis Date   CAD in native artery    a. DES to ramus 2005 with late stent thrombosis 2006 tx with PTCA. 12/18 PCI/DES x1 to mRCA, EF 50-55%   CHF (congestive heart failure) (HCC)    Chronic pain    COPD (chronic obstructive pulmonary disease) (HCC)    Hyperlipidemia    Hypertension    Left bundle branch block    Lymphedema    Metastatic breast cancer    a. to bone.   MI (myocardial infarction) (HCC)    Mild aortic stenosis 10/2017   Morbid obesity (HCC)    PAF (paroxysmal atrial fibrillation) (HCC)    PSVT (paroxysmal supraventricular tachycardia) (HCC)    a. per [facility] notes, seen on event monitor in 2014.   Pulmonary nodules    Past Surgical History:  Procedure Laterality Date   BILIARY STENT PLACEMENT  03/09/2024   Procedure: INSERTION, STENT, BILE DUCT;  Surgeon: Jinny Carmine, MD;  Location: [facility] ENDOSCOPY;  Service: Endoscopy;;   CHOLECYSTECTOMY N/A 03/29/2021   Procedure: LAPAROSCOPIC CHOLECYSTECTOMY;  Surgeon: Kallie Manuelita BROCKS, MD;  Location: [facility] ORS;  Service: General;  Laterality: N/A;   CORONARY STENT INTERVENTION N/A 11/26/2017   Procedure: CORONARY STENT INTERVENTION;  Surgeon: Swaziland, Peter M, MD;  Location: [facility] INVASIVE CV LAB;  Service: Cardiovascular;  Laterality: N/A;   CORONARY STENT PLACEMENT     ERCP N/A 03/28/2021   Procedure: ENDOSCOPIC RETROGRADE CHOLANGIOPANCREATOGRAPHY (ERCP);  Surgeon: Golda Claudis PENNER, MD;  Location: [facility];  Service: Endoscopy;  Laterality: N/A;   ERCP N/A 03/09/2024   Procedure: ERCP, WITH INTERVENTION IF INDICATED;  Surgeon: Jinny Carmine, MD;  Location: [facility] ENDOSCOPY;  Service: Endoscopy;  Laterality: N/A;   ERCP N/A 04/15/2024   Procedure: ERCP, WITH INTERVENTION IF INDICATED;  Surgeon: Jinny Carmine, MD;   Location: [facility] ENDOSCOPY;  Service: Endoscopy;  Laterality: N/A;   ESOPHAGOGASTRODUODENOSCOPY  03/09/2024   Procedure: EGD (ESOPHAGOGASTRODUODENOSCOPY);  Surgeon: Jinny Carmine, MD;  Location: [facility] ENDOSCOPY;  Service: Endoscopy;;   ESOPHAGOGASTRODUODENOSCOPY N/A 06/09/2024   Procedure: EGD (ESOPHAGOGASTRODUODENOSCOPY);  Surgeon: Cinderella Deatrice FALCON, MD;  Location[facility];  Service: Endoscopy;  Laterality: N/A;   intrathecal pain pump     LEFT HEART CATH AND CORONARY ANGIOGRAPHY N/A 11/26/2017   Procedure: LEFT HEART CATH AND CORONARY ANGIOGRAPHY;  Surgeon: Swaziland, Peter M, MD;  Location: [facility] INVASIVE CV LAB;  Service: Cardiovascular;  Laterality: N/A;   REMOVAL OF STONES  03/28/2021   Procedure: REMOVAL OF STONES;  Surgeon: Golda Claudis PENNER, MD;  Location: [facility];  Service: Endoscopy;;   SPHINCTEROTOMY  03/28/2021   Procedure: SPHINCTEROTOMY;  Surgeon: Golda Claudis PENNER, MD;  Location: [facility];  Service: Endoscopy;;   UMBILICAL HERNIA REPAIR N/A 04/03/2021   Procedure: ADULT PRIMARY UMBILICAL HERNIA REPAIR;  Surgeon: Kallie Manuelita BROCKS, MD;  Location: [facility];  Service: General;  Laterality: N/A;   VASCULAR SURGERY     No current facility-administered medications for this encounter.   No current facility-administered medications for this encounter. Allergies  Allergen Reactions   Albuterol  Other (See Comments)    Heart racing    Brilinta  [Ticagrelor ] Diarrhea and Nausea Only    Nausea and severe diarrhea- general weakness. Patient says does not want to take again  Budesonide  Palpitations   Family History  Problem Relation Age of Onset   CAD Father 45   Heart attack Father    COPD Sister    CAD Paternal Grandmother    Sudden Cardiac Death Neg Hx    Colon polyps Neg Hx    Colon cancer Neg Hx    Social History   Socioeconomic History   Marital status: Single    Spouse name: Not on file   Number of children: Not on file   Years of education: Not on file   Highest education  level: Not on file  Occupational History   Occupation: Retired  Tobacco Use   Smoking status: Former    Current packs/day: 0.00    Average packs/day: 2.0 packs/day for 18.0 years (36.0 ttl pk-yrs)    Types: Cigarettes    Start date: 12/01/1962    Quit date: 12/01/1980    Years since quitting: 43.6   Smokeless tobacco: Never  Vaping Use   Vaping status: Never Used  Substance and Sexual Activity   Alcohol  use: No   Drug use: Never   Sexual activity: Not Currently  Other Topics Concern   Not on file  Social History Narrative   Not on file   Social Drivers of Health   Financial Resource Strain: High Risk (01/04/2024)   Received from [facility]   Overall Financial Resource Strain (CARDIA)    Difficulty of Paying Living Expenses: Hard  Food Insecurity: No Food Insecurity (06/07/2024)   Hunger Vital Sign    Worried About Running Out of Food in the Last Year: Never true    Ran Out of Food in the Last Year: Never true  Transportation Needs: No Transportation Needs (06/07/2024)   PRAPARE - Administrator, Civil Service (Medical): No    Lack of Transportation (Non-Medical): No  Physical Activity: Inactive (08/17/2023)   Received from [facility]   Exercise Vital Sign    On average, how many days per week do you engage in moderate to strenuous exercise (like a brisk walk)?: 0 days    On average, how many minutes do you engage in exercise at this level?: 0 min  Stress: No Stress Concern Present (08/17/2023)   Received from [facility] - Occupational Stress Questionnaire    Feeling of Stress : Only a little  Social Connections: Unknown (06/07/2024)   Social Connection and Isolation Panel    Frequency of Communication with Friends and Family: More than three times a week    Frequency of Social Gatherings with Friends and Family: More than three times a week    Attends Religious Services:  More than 4 times per year    Active Member of [employer] or Organizations: No    Attends Engineer, structural: More than 4 times per year    Marital Status: Patient declined  Recent Concern: Social Connections - Socially Isolated (03/09/2024)   Social Connection and Isolation Panel    Frequency of Communication with Friends and Family: More than three times a week    Frequency of Social Gatherings with Friends and Family: More than three times a week    Attends Religious Services: Never    Database administrator or Organizations: No    Attends Banker Meetings: Never    Marital Status: Widowed  Intimate Partner Violence: Not At Risk (06/07/2024)   Humiliation, Afraid, Rape, and Kick  questionnaire    Fear of Current or Ex-Partner: No    Emotionally Abused: No    Physically Abused: No    Sexually Abused: No    Physical Exam: There were no vitals filed for this visit. There is no height or weight on file to calculate BMI. GEN: NAD EYE: Sclerae anicteric ENT: MMM CV: Non-tachycardic GI: Soft, NT/ND NEURO:  Alert & Oriented x 3  Lab Results: Recent Labs    06/29/24 1246  WBC 3.7*  HGB 10.6*  HCT 32.6*  PLT 192   BMET Recent Labs    06/29/24 1246  NA 141  K 3.4*  CL 107  CO2 27  GLUCOSE 140*  BUN 13  CREATININE 0.49  CALCIUM  7.9*   LFT Recent Labs    06/29/24 1246  PROT 5.8*  ALBUMIN  2.3*  AST 42*  ALT 22  ALKPHOS 208*  BILITOT 0.8   PT/INR No results for input(s): LABPROT, INR in the last 72 hours.   Impression / Plan: This is a 78 y.o.female who presents for EGD/EUS to evaluate a pancreatic lesion in the NOP.    The risks of an EUS including intestinal perforation, bleeding, infection, aspiration, and medication effects were discussed as was the possibility it may not give a definitive diagnosis if a biopsy is performed.  When a biopsy of the pancreas is done as part of the EUS, there is an additional risk of pancreatitis at the rate  of about 1-2%.  It was explained that procedure related pancreatitis is typically mild, although it can be severe and even life threatening, which is why we do not perform random pancreatic biopsies and only biopsy a lesion/area we feel is concerning enough to warrant the risk.   The risks and benefits of endoscopic evaluation/treatment were discussed with the patient and/or family; these include but are not limited to the risk of perforation, infection, bleeding, missed lesions, lack of diagnosis, severe illness requiring hospitalization, as well as anesthesia and sedation related illnesses.  The patient's history has been reviewed, patient examined, no change in status, and deemed stable for procedure.  The patient and/or family is agreeable to proceed.    Aloha Finner, MD [place] [facility] Advanced [facility] Office # [phone]

## 2024-06-30 NOTE — Op Note (Addendum)
 [facility] Patient Name: Stacey [name] Procedure Date: 06/30/2024 MRN: [id number] Attending MD: Aloha Finner , MD, [id number] Date of Birth: Apr 27, 1946 CSN: [id number] Age: 78 Admit Type: Outpatient Procedure:                Upper EUS Indications:              Dilated pancreatic duct on CT scan, Suspected mass                            in pancreas on CT scan Providers:                Aloha Finner, MD, Randall Lines, RN, Jasmine                            Petiford, Technician, Miyvetteshaune G,CRNA Referring MD:              Medicines:                Monitored Anesthesia Care Complications:            No immediate complications. Estimated Blood Loss:     Estimated blood loss was minimal. Procedure:                Pre-Anesthesia Assessment:                           - Prior to the procedure, a History and Physical                            was performed, and patient medications and                            allergies were reviewed. The patient's tolerance of                            previous anesthesia was also reviewed. The risks                            and benefits of the procedure and the sedation                            options and risks were discussed with the patient.                            All questions were answered, and informed consent                            was obtained. Prior Anticoagulants: The patient                            last took Eliquis  (apixaban ) 2 days and Plavix                             (clopidogrel ) 5 days prior to the procedure. ASA  Grade Assessment: III - A patient with severe                            systemic disease. After reviewing the risks and                            benefits, the patient was deemed in satisfactory                            condition to undergo the procedure.                           After obtaining informed consent, the endoscope was                             passed under direct vision. Throughout the                            procedure, the patient's blood pressure, pulse, and                            oxygen  saturations were monitored continuously. The                            [id number]-[id number] ([id number] Olympus endoscope was introduced                            through the mouth, and advanced to the second part                            of duodenum. The [id number] ([id number] Olympus                            ultrasound scope was introduced through the mouth,                            and advanced to the duodenum for ultrasound                            examination from the stomach and duodenum. The                            upper EUS was accomplished without difficulty. The                            patient tolerated the procedure. Scope In: Scope Out: Findings:      ENDOSCOPIC FINDING: :      White nummular lesions were noted in the entire esophagus. Biopsies were       taken with a cold forceps for histology.      The Z-line was irregular and was found 39 cm from the incisors.      A large amount of food (residue) was found in the entire examined       stomach. Suction via Endoscope was performed without  significant success.      Food (residue) was found in the duodenal bulb. Suction via Endoscope was       performed with partial success.      Localized moderate inflammation characterized by congestion (edema) and       friability was found in the duodenal sweep.      A previously placed metal biliary stent was seen at the major papilla.      ENDOSONOGRAPHIC FINDING: :      One stent was visualized endosonographically in the common bile duct.       Significant scatter effect as result of the stent was noted.      The pancreatic duct had a dilated endosonographic appearance in the       pancreatic head. The pancreatic duct measured up to 5 mm in diameter. As       a result of the scatter effect, I cannot visualize the head of  pancreas       appropriately for sampling purposes. Impression:               EGD impression:                           - White nummular lesions in esophageal mucosa.                            Biopsied.                           - Z-line irregular, 39 cm from the incisors.                           - A large amount of food (residue) in the stomach.                            Suction via endoscope not successful.                           - Retained food in the duodenum. Suction of the                            endoscope partially successful.                           - Duodenitis in the duodenal sweep                           - Metal biliary stent at the major papilla.                           EUS impression:                           - One stent was visualized endosonographically in                            the common bile duct. This caused significant  scatter effect.                           - The pancreatic duct had a dilated endosonographic                            appearance in the pancreatic head. The pancreatic                            duct measured up to 5 mm in diameter. Cannot                            overtly visualize a mass/lesion as result of                            scatter effect. Moderate Sedation:      Not Applicable - Patient had care per Anesthesia. Recommendation:           - The patient will be observed post-procedure,                            until all discharge criteria are met.                           - Discharge patient to home.                           - Patient has a contact number available for                            emergencies. The signs and symptoms of potential                            delayed complications were discussed with the                            patient. Return to normal activities tomorrow.                            Written discharge instructions were provided to the                             patient.                           - Resume previous diet.                           - Observe patient's clinical course.                           - Await path results.                           - As a result of the patient's metal stent,  visualization of the pancreas is significantly                            interfered. I cannot overtly see a mass/lesion, but                            with the amount of foodstuffs that were in the                            stomach and in the duodenum as well and the risk of                            aspiration it was not felt that continuing EUS                            would be of assistance or help for the patient. We                            can consider trying to repeat the EUS with 1 full                            day of liquids the day prior. It is possible that                            we will have better visualization at that time.                            However if we try this route and she continues to                            have inability to visualize the area further as a                            result of scatter, then ERCP + EUS will need to be                            considered for stent removal/exchange and EUS which                            unfortunately I currently do not have availability                            for this in the coming weeks.                           - The findings and recommendations were discussed                            with the patient.                           - The findings  and recommendations were discussed                            with the patient's family. Procedure Code(s):        --- Professional ---                           [phone], Esophagogastroduodenoscopy, flexible,                            transoral; with endoscopic ultrasound examination                            limited to the esophagus, stomach or duodenum, and                             adjacent structures                           43239, Esophagogastroduodenoscopy, flexible,                            transoral; with biopsy, single or multiple Diagnosis Code(s):        --- Professional ---                           K22.89, Other specified disease of esophagus                           K29.80, Duodenitis without bleeding                           R93.3, Abnormal findings on diagnostic imaging of                            other parts of digestive tract                           K86.89, Other specified diseases of pancreas CPT copyright 2022 [employer]. All rights reserved. The codes documented in this report are preliminary and upon coder review may  be revised to meet current compliance requirements. Aloha Finner, MD 06/30/2024 3:50:44 PM Number of Addenda: 0

## 2024-06-30 NOTE — Anesthesia Preprocedure Evaluation (Signed)
 Anesthesia Evaluation  Patient identified by MRN, date of birth, ID band Patient awake    Reviewed: Allergy & Precautions, H&P , NPO status , Patient's Chart, lab work & pertinent test results  Airway Mallampati: II   Neck ROM: full    Dental   Pulmonary COPD, former smoker   breath sounds clear to auscultation       Cardiovascular hypertension, + CAD, + Past MI, + Cardiac Stents and +CHF  + dysrhythmias Atrial Fibrillation  Rhythm:regular Rate:Normal     Neuro/Psych  PSYCHIATRIC DISORDERS Anxiety Depression Bipolar Disorder      GI/Hepatic PUD,,,  Endo/Other    Class 3 obesity  Renal/GU      Musculoskeletal   Abdominal   Peds  Hematology   Anesthesia Other Findings   Reproductive/Obstetrics                              Anesthesia Physical Anesthesia Plan  ASA: 3  Anesthesia Plan: MAC   Post-op Pain Management:    Induction: Intravenous  PONV Risk Score and Plan: 2 and Propofol  infusion and Treatment may vary due to age or medical condition  Airway Management Planned: Nasal Cannula  Additional Equipment:   Intra-op Plan:   Post-operative Plan:   Informed Consent: I have reviewed the patients History and Physical, chart, labs and discussed the procedure including the risks, benefits and alternatives for the proposed anesthesia with the patient or authorized representative who has indicated his/her understanding and acceptance.     Dental advisory given  Plan Discussed with: CRNA, Anesthesiologist and Surgeon  Anesthesia Plan Comments:         Anesthesia Quick Evaluation

## 2024-07-04 ENCOUNTER — Encounter (HOSPITAL_COMMUNITY): Payer: Self-pay | Admitting: Gastroenterology

## 2024-07-04 LAB — SURGICAL PATHOLOGY

## 2024-07-05 NOTE — Progress Notes (Signed)
Patient result letter mailed procedure note and pathology result faxed to PCP

## 2024-07-06 ENCOUNTER — Ambulatory Visit: Payer: Self-pay | Admitting: Gastroenterology

## 2024-07-06 ENCOUNTER — Telehealth: Payer: Self-pay

## 2024-07-06 ENCOUNTER — Telehealth: Payer: Self-pay | Admitting: Gastroenterology

## 2024-07-06 NOTE — Telephone Encounter (Signed)
 repeat EUS after failed EUS due to foodstuffs.  Can you please coordinate with the patient's family and patient her coming in for her repeat procedure.

## 2024-07-06 NOTE — Telephone Encounter (Signed)
 Procedure:Upper EUS Procedure date: 07/14/24 Procedure location: [facility] Arrival Time: 9:30 am Spoke with the patient Y/N:   No, I left a detailed message [phone] on 07/06/24 @ 1:35 pm for the patient to return call No, I left a detailed message [phone] on 07/07/24 @ 10:01 am for the patient to return call, Mychart message sent. Yes, 07/08/24 @ 3:31 pm   Any prep concerns? No  Has the patient obtained the prep from the pharmacy ? No prep needed Do you have a care partner and transportation: EMS will bring her Any additional concerns? No

## 2024-07-06 NOTE — Telephone Encounter (Signed)
 Instructions have been sent to the pt My Chart   Left message on machine to call back

## 2024-07-07 NOTE — Telephone Encounter (Signed)
 Left message on machine to call back using pt numbers and on the daughters number in the chart

## 2024-07-07 NOTE — Telephone Encounter (Signed)
 EUS scheduled, pt instructed and medications reviewed.  Patient instructions mailed to home.  Patient to call with any questions or concerns.   The pt is aware to hold Eliquis  and Plavix  as directed

## 2024-07-07 NOTE — Telephone Encounter (Signed)
 PT requesting call back [phone]

## 2024-07-11 NOTE — Anesthesia Postprocedure Evaluation (Signed)
 Anesthesia Post Note  Patient: Stacey [name]  Procedure(s) Performed: EGD (ESOPHAGOGASTRODUODENOSCOPY) ULTRASOUND, UPPER GI TRACT, ENDOSCOPIC     Patient location during evaluation: Endoscopy Anesthesia Type: MAC Level of consciousness: awake and alert Pain management: pain level controlled Vital Signs Assessment: post-procedure vital signs reviewed and stable Respiratory status: spontaneous breathing, nonlabored ventilation, respiratory function stable and patient connected to nasal cannula oxygen  Cardiovascular status: stable and blood pressure returned to baseline Postop Assessment: no apparent nausea or vomiting Anesthetic complications: no   No notable events documented.  Last Vitals:  Vitals:   06/30/24 1708 06/30/24 1830  BP: (!) 118/59 (!) 151/58  Pulse:  64  Resp: 12 14  Temp:    SpO2: 95% 95%    Last Pain:  Vitals:   06/30/24 1630  TempSrc:   PainSc: 10-Worst pain ever                 Rechy Bost S

## 2024-07-14 ENCOUNTER — Encounter (HOSPITAL_COMMUNITY)
Admission: RE | Disposition: A | Discharge status: Home / Self Care | Payer: Self-pay | Source: Home / Self Care | Attending: Gastroenterology

## 2024-07-14 ENCOUNTER — Ambulatory Visit (HOSPITAL_COMMUNITY)
Admission: RE | Admit: 2024-07-14 | Discharge: 2024-07-14 | Disposition: A | Discharge status: Home / Self Care | Attending: Gastroenterology | Admitting: Gastroenterology

## 2024-07-14 ENCOUNTER — Other Ambulatory Visit: Payer: Self-pay

## 2024-07-14 ENCOUNTER — Encounter (HOSPITAL_COMMUNITY): Payer: Self-pay | Admitting: Gastroenterology

## 2024-07-14 ENCOUNTER — Ambulatory Visit (HOSPITAL_BASED_OUTPATIENT_CLINIC_OR_DEPARTMENT_OTHER): Admitting: Anesthesiology

## 2024-07-14 ENCOUNTER — Ambulatory Visit (HOSPITAL_COMMUNITY): Admitting: Anesthesiology

## 2024-07-14 DIAGNOSIS — Z9689 Presence of other specified functional implants: Secondary | ICD-10-CM | POA: Diagnosis not present

## 2024-07-14 DIAGNOSIS — C250 Malignant neoplasm of head of pancreas: Secondary | ICD-10-CM

## 2024-07-14 DIAGNOSIS — I2510 Atherosclerotic heart disease of native coronary artery without angina pectoris: Secondary | ICD-10-CM

## 2024-07-14 DIAGNOSIS — Z955 Presence of coronary angioplasty implant and graft: Secondary | ICD-10-CM | POA: Diagnosis not present

## 2024-07-14 DIAGNOSIS — I252 Old myocardial infarction: Secondary | ICD-10-CM

## 2024-07-14 DIAGNOSIS — I509 Heart failure, unspecified: Secondary | ICD-10-CM | POA: Insufficient documentation

## 2024-07-14 DIAGNOSIS — K2289 Other specified disease of esophagus: Secondary | ICD-10-CM | POA: Insufficient documentation

## 2024-07-14 DIAGNOSIS — Z87891 Personal history of nicotine dependence: Secondary | ICD-10-CM | POA: Insufficient documentation

## 2024-07-14 DIAGNOSIS — Z7902 Long term (current) use of antithrombotics/antiplatelets: Secondary | ICD-10-CM | POA: Insufficient documentation

## 2024-07-14 DIAGNOSIS — Z7401 Bed confinement status: Secondary | ICD-10-CM | POA: Diagnosis not present

## 2024-07-14 DIAGNOSIS — K3189 Other diseases of stomach and duodenum: Secondary | ICD-10-CM | POA: Diagnosis not present

## 2024-07-14 DIAGNOSIS — Z8711 Personal history of peptic ulcer disease: Secondary | ICD-10-CM | POA: Diagnosis not present

## 2024-07-14 DIAGNOSIS — Z79899 Other long term (current) drug therapy: Secondary | ICD-10-CM | POA: Diagnosis not present

## 2024-07-14 DIAGNOSIS — J449 Chronic obstructive pulmonary disease, unspecified: Secondary | ICD-10-CM | POA: Diagnosis not present

## 2024-07-14 DIAGNOSIS — K862 Cyst of pancreas: Secondary | ICD-10-CM

## 2024-07-14 DIAGNOSIS — I110 Hypertensive heart disease with heart failure: Secondary | ICD-10-CM | POA: Insufficient documentation

## 2024-07-14 DIAGNOSIS — K2980 Duodenitis without bleeding: Secondary | ICD-10-CM

## 2024-07-14 DIAGNOSIS — I899 Noninfective disorder of lymphatic vessels and lymph nodes, unspecified: Secondary | ICD-10-CM

## 2024-07-14 DIAGNOSIS — Z5986 Financial insecurity: Secondary | ICD-10-CM | POA: Insufficient documentation

## 2024-07-14 DIAGNOSIS — K8689 Other specified diseases of pancreas: Secondary | ICD-10-CM

## 2024-07-14 DIAGNOSIS — B3781 Candidal esophagitis: Secondary | ICD-10-CM

## 2024-07-14 HISTORY — PX: FINE NEEDLE ASPIRATION BIOPSY: CATH118315

## 2024-07-14 HISTORY — PX: ESOPHAGOGASTRODUODENOSCOPY: SHX5428

## 2024-07-14 HISTORY — PX: EUS: SHX5427

## 2024-07-14 SURGERY — ULTRASOUND, UPPER GI TRACT, ENDOSCOPIC
Anesthesia: Monitor Anesthesia Care

## 2024-07-14 MED ORDER — LIDOCAINE 2% (20 MG/ML) 5 ML SYRINGE
INTRAMUSCULAR | Status: DC | PRN
  Administered 2024-07-14: 40 mg via INTRAVENOUS

## 2024-07-14 MED ORDER — PROPOFOL 10 MG/ML IV BOLUS
INTRAVENOUS | Status: DC | PRN
  Administered 2024-07-14 (×2): 20 mg via INTRAVENOUS

## 2024-07-14 MED ORDER — PROPOFOL 500 MG/50ML IV EMUL
INTRAVENOUS | Status: DC | PRN
  Administered 2024-07-14: 100 ug/kg/min via INTRAVENOUS

## 2024-07-14 MED ORDER — SODIUM CHLORIDE 0.9 % IV SOLN: INTRAVENOUS | Status: DC | PRN

## 2024-07-14 NOTE — Anesthesia Postprocedure Evaluation (Signed)
 Anesthesia Post Note  Patient: Stacey  B [name]  Procedure(s) Performed: ULTRASOUND, UPPER GI TRACT, ENDOSCOPIC EGD (ESOPHAGOGASTRODUODENOSCOPY) FINE NEEDLE ASPIRATION BIOPSY     Patient location during evaluation: Endoscopy Anesthesia Type: MAC Level of consciousness: awake Pain management: pain level controlled Vital Signs Assessment: post-procedure vital signs reviewed and stable Respiratory status: spontaneous breathing, nonlabored ventilation and respiratory function stable Cardiovascular status: blood pressure returned to baseline and stable Postop Assessment: no apparent nausea or vomiting Anesthetic complications: no   No notable events documented.  Last Vitals:  Vitals:   07/14/24 1240 07/14/24 1250  BP: (!) 120/49 (!) 142/62  Pulse: 69 73  Resp: 15 14  Temp:    SpO2: 96% 92%    Last Pain:  Vitals:   07/14/24 1250  TempSrc:   PainSc: 0-No pain                 [name] P Kaylem Gidney

## 2024-07-14 NOTE — Discharge Instructions (Signed)

## 2024-07-14 NOTE — Anesthesia Preprocedure Evaluation (Addendum)
 Anesthesia Evaluation  Patient identified by MRN, date of birth, ID band Patient awake    Reviewed: Allergy & Precautions, NPO status , Patient's Chart, lab work & pertinent test results  Airway Mallampati: II  TM Distance: >3 FB Neck ROM: Full    Dental  (+) Edentulous Upper, Missing   Pulmonary COPD, former smoker   Pulmonary exam normal        Cardiovascular hypertension, Pt. on home beta blockers + CAD, + Past MI, + Cardiac Stents and +CHF  Normal cardiovascular exam+ dysrhythmias Atrial Fibrillation + Valvular Problems/Murmurs      Neuro/Psych  PSYCHIATRIC DISORDERS Anxiety Depression Bipolar Disorder   negative neurological ROS     GI/Hepatic Neg liver ROS, PUD,,,  Endo/Other  negative endocrine ROS    Renal/GU Renal disease     Musculoskeletal Bed bound   Abdominal  (+) + obese  Peds  Hematology  (+) Blood dyscrasia (Plavix )   Anesthesia Other Findings pancreatic lesion  Reproductive/Obstetrics                              Anesthesia Physical Anesthesia Plan  ASA: 4  Anesthesia Plan: MAC   Post-op Pain Management:    Induction:   PONV Risk Score and Plan: 2 and Treatment may vary due to age or medical condition  Airway Management Planned: Nasal Cannula  Additional Equipment:   Intra-op Plan:   Post-operative Plan:   Informed Consent: I have reviewed the patients History and Physical, chart, labs and discussed the procedure including the risks, benefits and alternatives for the proposed anesthesia with the patient or authorized representative who has indicated his/her understanding and acceptance.     Dental advisory given  Plan Discussed with: CRNA  Anesthesia Plan Comments:          Anesthesia Quick Evaluation

## 2024-07-14 NOTE — H&P (Signed)
 GASTROENTEROLOGY PROCEDURE H&P NOTE   Primary Care Physician: Rosamond Leta NOVAK, MD  HPI: Stacey  EXILDA [name] is a 78 y.o. female who presents for EGD/EUS to evaluate for an underlying concern of pancreatic malignancy on imaging and recurrent presentation of biliary obstruction.  Past Medical History:  Diagnosis Date   CAD in native artery    a. DES to ramus 2005 with late stent thrombosis 2006 tx with PTCA. 12/18 PCI/DES x1 to mRCA, EF 50-55%   CHF (congestive heart failure) (HCC)    Chronic pain    COPD (chronic obstructive pulmonary disease) (HCC)    Hyperlipidemia    Hypertension    Left bundle branch block    Lymphedema    Metastatic breast cancer    a. to bone.   MI (myocardial infarction) (HCC)    Mild aortic stenosis 10/2017   Morbid obesity (HCC)    PAF (paroxysmal atrial fibrillation) (HCC)    PSVT (paroxysmal supraventricular tachycardia) (HCC)    a. per [facility] notes, seen on event monitor in 2014.   Pulmonary nodules    Past Surgical History:  Procedure Laterality Date   BILIARY STENT PLACEMENT  03/09/2024   Procedure: INSERTION, STENT, BILE DUCT;  Surgeon: Jinny Carmine, MD;  Location: [facility] ENDOSCOPY;  Service: Endoscopy;;   CHOLECYSTECTOMY N/A 03/29/2021   Procedure: LAPAROSCOPIC CHOLECYSTECTOMY;  Surgeon: Kallie Manuelita BROCKS, MD;  Location: [facility];  Service: General;  Laterality: N/A;   CORONARY STENT INTERVENTION N/A 11/26/2017   Procedure: CORONARY STENT INTERVENTION;  Surgeon: Swaziland, Peter M, MD;  Location: [facility] INVASIVE CV LAB;  Service: Cardiovascular;  Laterality: N/A;   CORONARY STENT PLACEMENT     ERCP N/A 03/28/2021   Procedure: ENDOSCOPIC RETROGRADE CHOLANGIOPANCREATOGRAPHY (ERCP);  Surgeon: Golda Claudis PENNER, MD;  Location: [facility];  Service: Endoscopy;  Laterality: N/A;   ERCP N/A 03/09/2024   Procedure: ERCP, WITH INTERVENTION IF INDICATED;  Surgeon: Jinny Carmine, MD;  Location: [facility] ENDOSCOPY;  Service: Endoscopy;  Laterality: N/A;   ERCP N/A 04/15/2024    Procedure: ERCP, WITH INTERVENTION IF INDICATED;  Surgeon: Jinny Carmine, MD;  Location: [facility] ENDOSCOPY;  Service: Endoscopy;  Laterality: N/A;   ESOPHAGOGASTRODUODENOSCOPY  03/09/2024   Procedure: EGD (ESOPHAGOGASTRODUODENOSCOPY);  Surgeon: Jinny Carmine, MD;  Location: [facility] ENDOSCOPY;  Service: Endoscopy;;   ESOPHAGOGASTRODUODENOSCOPY N/A 06/09/2024   Procedure: EGD (ESOPHAGOGASTRODUODENOSCOPY);  Surgeon: Cinderella Deatrice FALCON, MD;  Location: [facility];  Service: Endoscopy;  Laterality: N/A;   ESOPHAGOGASTRODUODENOSCOPY N/A 06/30/2024   Procedure: EGD (ESOPHAGOGASTRODUODENOSCOPY);  Surgeon: Wilhelmenia Aloha Raddle., MD;  Location: [facility];  Service: Gastroenterology;  Laterality: N/A;   EUS N/A 06/30/2024   Procedure: ULTRASOUND, UPPER GI TRACT, ENDOSCOPIC;  Surgeon: Wilhelmenia Aloha Raddle., MD;  Location: [facility] ENDOSCOPY;  Service: Gastroenterology;  Laterality: N/A;   intrathecal pain pump     LEFT HEART CATH AND CORONARY ANGIOGRAPHY N/A 11/26/2017   Procedure: LEFT HEART CATH AND CORONARY ANGIOGRAPHY;  Surgeon: Swaziland, Peter M, MD;  Location: [facility] INVASIVE CV LAB;  Service: Cardiovascular;  Laterality: N/A;   REMOVAL OF STONES  03/28/2021   Procedure: REMOVAL OF STONES;  Surgeon: Golda Claudis PENNER, MD;  Location: [facility];  Service: Endoscopy;;   SPHINCTEROTOMY  03/28/2021   Procedure: SPHINCTEROTOMY;  Surgeon: Golda Claudis PENNER, MD;  Location: [facility];  Service: Endoscopy;;   UMBILICAL HERNIA REPAIR N/A 04/03/2021   Procedure: ADULT PRIMARY UMBILICAL HERNIA REPAIR;  Surgeon: Kallie Manuelita BROCKS, MD;  Location: [facility];  Service: General;  Laterality: N/A;   VASCULAR SURGERY  No current facility-administered medications for this encounter.   No current facility-administered medications for this encounter. Allergies  Allergen Reactions   Albuterol  Other (See Comments)    Heart racing    Brilinta  [Ticagrelor ] Diarrhea and Nausea Only    Nausea and severe diarrhea- general weakness. Patient says  does not want to take again   Budesonide  Palpitations   Family History  Problem Relation Age of Onset   CAD Father 78   Heart attack Father    COPD Sister    CAD Paternal Grandmother    Sudden Cardiac Death Neg Hx    Colon polyps Neg Hx    Colon cancer Neg Hx    Social History   Socioeconomic History   Marital status: Single    Spouse name: Not on file   Number of children: Not on file   Years of education: Not on file   Highest education level: Not on file  Occupational History   Occupation: Retired  Tobacco Use   Smoking status: Former    Current packs/day: 0.00    Average packs/day: 2.0 packs/day for 18.0 years (36.0 ttl pk-yrs)    Types: Cigarettes    Start date: 12/01/1962    Quit date: 12/01/1980    Years since quitting: 43.6   Smokeless tobacco: Never  Vaping Use   Vaping status: Never Used  Substance and Sexual Activity   Alcohol  use: No   Drug use: Never   Sexual activity: Not Currently  Other Topics Concern   Not on file  Social History Narrative   Not on file   Social Drivers of Health   Financial Resource Strain: High Risk (01/04/2024)   Received from [facility]   Overall Financial Resource Strain (CARDIA)    Difficulty of Paying Living Expenses: Hard  Food Insecurity: No Food Insecurity (06/07/2024)   Hunger Vital Sign    Worried About Running Out of Food in the Last Year: Never true    Ran Out of Food in the Last Year: Never true  Transportation Needs: No Transportation Needs (06/07/2024)   PRAPARE - Administrator, Civil Service (Medical): No    Lack of Transportation (Non-Medical): No  Physical Activity: Inactive (08/17/2023)   Received from [facility]   Exercise Vital Sign    On average, how many days per week do you engage in moderate to strenuous exercise (like a brisk walk)?: 0 days    On average, how many minutes do you engage in exercise at this level?: 0 min  Stress: No Stress Concern Present  (08/17/2023)   Received from [facility] - Occupational Stress Questionnaire    Feeling of Stress : Only a little  Social Connections: Unknown (06/07/2024)   Social Connection and Isolation Panel    Frequency of Communication with Friends and Family: More than three times a week    Frequency of Social Gatherings with Friends and Family: More than three times a week    Attends Religious Services: More than 4 times per year    Active Member of [employer] or Organizations: No    Attends Engineer, structural: More than 4 times per year    Marital Status: Patient declined  Recent Concern: Social Connections - Socially Isolated (03/09/2024)   Social Connection and Isolation Panel    Frequency of Communication with Friends and Family: More than three times a week    Frequency of  Social Gatherings with Friends and Family: More than three times a week    Attends Religious Services: Never    Database administrator or Organizations: No    Attends Banker Meetings: Never    Marital Status: Widowed  Intimate Partner Violence: Not At Risk (06/07/2024)   Humiliation, Afraid, Rape, and Kick questionnaire    Fear of Current or Ex-Partner: No    Emotionally Abused: No    Physically Abused: No    Sexually Abused: No    Physical Exam: There were no vitals filed for this visit. There is no height or weight on file to calculate BMI. GEN: NAD EYE: Sclerae anicteric ENT: MMM CV: Non-tachycardic GI: Soft, NT/ND NEURO:  Alert & Oriented x 3  Lab Results: No results for input(s): WBC, HGB, HCT, PLT in the last 72 hours. BMET No results for input(s): NA, K, CL, CO2, GLUCOSE, BUN, CREATININE, CALCIUM  in the last 72 hours. LFT No results for input(s): PROT, ALBUMIN , AST, ALT, ALKPHOS, BILITOT, BILIDIR, IBILI in the last 72 hours. PT/INR No results for input(s): LABPROT, INR in the last 72  hours.   Impression / Plan: This is a 78 y.o.female who presents for EGD/EUS to evaluate for an underlying concern of pancreatic malignancy on imaging and recurrent presentation of biliary obstruction.  The risks of an EUS including intestinal perforation, bleeding, infection, aspiration, and medication effects were discussed as was the possibility it may not give a definitive diagnosis if a biopsy is performed.  When a biopsy of the pancreas is done as part of the EUS, there is an additional risk of pancreatitis at the rate of about 1-2%.  It was explained that procedure related pancreatitis is typically mild, although it can be severe and even life threatening, which is why we do not perform random pancreatic biopsies and only biopsy a lesion/area we feel is concerning enough to warrant the risk.  The risks and benefits of endoscopic evaluation/treatment were discussed with the patient and/or family; these include but are not limited to the risk of perforation, infection, bleeding, missed lesions, lack of diagnosis, severe illness requiring hospitalization, as well as anesthesia and sedation related illnesses.  The patient's history has been reviewed, patient examined, no change in status, and deemed stable for procedure.  The patient and/or family is agreeable to proceed.    Aloha Finner, MD [facility] Office # [phone]

## 2024-07-14 NOTE — Op Note (Signed)
 [facility] Patient Name: Stacey [name] Procedure Date: 07/14/2024 MRN: [id number] Attending MD: Aloha Finner , MD, [id number] Date of Birth: 26-Feb-1946 CSN: [id number] Age: 78 Admit Type: Outpatient Procedure:                Upper EUS Indications:              Pancreatic cyst on CT scan Providers:                Aloha Finner, MD, Hoy Penner, RN,                            Haskel Chris, Technician Referring MD:              Medicines:                Monitored Anesthesia Care Complications:            No immediate complications. Estimated Blood Loss:     Estimated blood loss was minimal. Procedure:                Pre-Anesthesia Assessment:                           - Prior to the procedure, a History and Physical                            was performed, and patient medications and                            allergies were reviewed. The patient's tolerance of                            previous anesthesia was also reviewed. The risks                            and benefits of the procedure and the sedation                            options and risks were discussed with the patient.                            All questions were answered, and informed consent                            was obtained. Prior Anticoagulants: The patient                            last took Eliquis  (apixaban ) 2 days and Plavix                             (clopidogrel ) 5 days prior to the procedure. ASA                            Grade Assessment: III - A patient with severe  systemic disease. After reviewing the risks and                            benefits, the patient was deemed in satisfactory                            condition to undergo the procedure.                           After obtaining informed consent, the endoscope was                            passed under direct vision. Throughout the                            procedure, the  patient's blood pressure, pulse, and                            oxygen  saturations were monitored continuously. The                            [id number]-[id number] ([id number]) Olympus endoscope was introduced                            through the mouth, and advanced to the second part                            of duodenum. The [id number] ([id number]) Olympus                            endosonoscope was introduced through the mouth, and                            advanced to the duodenum for ultrasound examination                            from the stomach and duodenum. The upper EUS was                            accomplished without difficulty. The patient                            tolerated the procedure. Scope In: Scope Out: Findings:      ENDOSCOPIC FINDING: :      White nummular lesions were noted in the entire esophagus. Biopsies were       taken with a cold forceps for histology.      The Z-line was regular and was found 35 cm from the incisors.      Patchy mildly erythematous mucosa was found in the entire examined       stomach. Biopsies were taken with a cold forceps for histology and       Helicobacter pylori testing.      Localized moderate inflammation characterized by congestion (edema) and       friability was found in the first  portion of the duodenum and in the       second portion of the duodenum.      A previously placed metal biliary stent was seen at the major papilla.      ENDOSONOGRAPHIC FINDING: :      One stent was visualized endosonographically in the common bile duct.       Unfortunately this significantly causes scatter effect to the head of       the pancreas. Fine needle biopsy was performed. Color Doppler imaging       was utilized prior to needle puncture to confirm a lack of significant       vascular structures within the needle path. Six passes were made with       the 22 gauge Acquire biopsy needle using a transduodenal approach. A       visible core of tissue was  obtained. Preliminary cytologic examination       and touch preps were performed. Final cytology results are pending.      An irregular lesion was identified in the pancreatic head. The area was       slightly more hypoechoic. It measured 16 mm by 16 mm in maximal       cross-sectional diameter. The endosonographic borders were       poorly-defined. The remainder of the pancreas was examined. The       endosonographic appearance of parenchyma and the upstream pancreatic       duct indicated duct dilation and parenchymal atrophy.      Endosonographic imaging in the visualized portion of the liver showed no       mass.      No malignant-appearing lymph nodes were visualized in the celiac region       (level 20), peripancreatic region and porta hepatis region.      The celiac region was visualized. Impression:               EGD Impression:                           - White nummular lesions in esophageal mucosa.                            Biopsied.                           - Z-line regular, 35 cm from the incisors.                           - Erythematous mucosa in the stomach. Biopsied.                           - Duodenitis.                           EUS Impression:                           - Metal biliary stent in the duodenum. Causes                            scatter effect to the HOP.                           -  One stent was visualized endosonographically in                            the common bile duct. Fine needle biopsy performed.                           - A mass-like region was identified in the                            pancreatic head. Cytology results are pending.                            However, the endosonographic appearance is                            suspicious for adenocarcinoma but as a result of                            scatter effect, this makes things more difficult to                            visualize. This was staged T1 N0 Mx by                             endosonographic criteria. The staging applies if                            malignancy is confirmed.                           - No malignant-appearing lymph nodes were                            visualized in the celiac region (level 20),                            peripancreatic region and porta hepatis region. Moderate Sedation:      Not Applicable - Patient had care per Anesthesia. Recommendation:           - The patient will be observed post-procedure,                            until all discharge criteria are met.                           - Discharge patient to home.                           - Patient has a contact number available for                            emergencies. The signs and symptoms of potential  delayed complications were discussed with the                            patient. Return to normal activities tomorrow.                            Written discharge instructions were provided to the                            patient.                           - Low fat diet.                           - Followup of cytology and pathology.                           - May restart Eliquis  on 8/16.                           - May restart Plavix  on 8/17.                           - Monitor for signs/symptoms of pancreatitis,                            bleeding, perforation, and infection. If issues                            please call our number to get further assistance as                            needed.                           - The findings and recommendations were discussed                            with the patient.                           - The findings and recommendations were discussed                            with the patient's family. Procedure Code(s):        --- Professional ---                           [phone], Esophagogastroduodenoscopy, flexible,                            transoral; with transendoscopic  ultrasound-guided                            intramural or transmural fine needle  aspiration/biopsy(s), (includes endoscopic                            ultrasound examination limited to the esophagus,                            stomach or duodenum, and adjacent structures)                           43239, 59, Esophagogastroduodenoscopy, flexible,                            transoral; with biopsy, single or multiple Diagnosis Code(s):        --- Professional ---                           K22.89, Other specified disease of esophagus                           K31.89, Other diseases of stomach and duodenum                           K29.80, Duodenitis without bleeding                           K86.89, Other specified diseases of pancreas                           I89.9, Noninfective disorder of lymphatic vessels                            and lymph nodes, unspecified                           K86.2, Cyst of pancreas CPT copyright 2022 American Medical Association. All rights reserved. The codes documented in this report are preliminary and upon coder review may  be revised to meet current compliance requirements. Aloha Finner, MD 07/14/2024 12:52:22 PM Number of Addenda: 0

## 2024-07-14 NOTE — Transfer of Care (Signed)
 Immediate Anesthesia Transfer of Care Note  Patient: Stacey [name]  Procedure(s) Performed: ULTRASOUND, UPPER GI TRACT, ENDOSCOPIC EGD (ESOPHAGOGASTRODUODENOSCOPY) FINE NEEDLE ASPIRATION BIOPSY  Patient Location: PACU  Anesthesia Type:MAC  Level of Consciousness: awake, alert , oriented, and patient cooperative  Airway & Oxygen  Therapy: Patient Spontanous Breathing and Patient connected to face mask oxygen   Post-op Assessment: Report given to RN and Post -op Vital signs reviewed and stable  Post vital signs: Reviewed and stable  Last Vitals:  Vitals Value Taken Time  BP 106/55 07/14/24 12:30  Temp    Pulse 73 07/14/24 12:32  Resp 17 07/14/24 12:33  SpO2 100 % 07/14/24 12:32  Vitals shown include unfiled device data.  Last Pain:  Vitals:   07/14/24 1230  TempSrc:   PainSc: Asleep         Complications: No notable events documented.

## 2024-07-15 ENCOUNTER — Ambulatory Visit: Payer: Self-pay | Admitting: Gastroenterology

## 2024-07-15 ENCOUNTER — Other Ambulatory Visit: Payer: Self-pay

## 2024-07-15 LAB — SURGICAL PATHOLOGY

## 2024-07-15 MED ORDER — FLUCONAZOLE 100 MG PO TABS: ORAL_TABLET | ORAL | 0 refills | Status: AC

## 2024-07-15 NOTE — Progress Notes (Signed)
 The pt returned call and has been advised of the results. She will pick up the prescription today and await further results

## 2024-07-15 NOTE — Telephone Encounter (Signed)
 Patient returned call, requesting a call back to discuss. Please advise.

## 2024-07-19 ENCOUNTER — Encounter (HOSPITAL_COMMUNITY): Payer: Self-pay | Admitting: Gastroenterology

## 2024-07-19 LAB — CYTOLOGY - NON PAP

## 2024-07-19 NOTE — Progress Notes (Signed)
Thanks Gabriel! 

## 2024-07-20 ENCOUNTER — Encounter (HOSPITAL_COMMUNITY): Payer: Self-pay | Admitting: Oncology

## 2024-07-25 ENCOUNTER — Inpatient Hospital Stay: Admitting: Oncology

## 2024-07-28 ENCOUNTER — Inpatient Hospital Stay

## 2024-07-28 ENCOUNTER — Telehealth: Payer: Self-pay | Admitting: *Deleted

## 2024-07-28 NOTE — Telephone Encounter (Signed)
 Patient called to advise that she has developed pain in right nipple.  Has an appointment to see Dr, Davonna next Wednesday.  Advised that she use her pain medication as prescribed and use warm compress in the meantime.  Denies other associated symptoms.

## 2024-07-29 ENCOUNTER — Ambulatory Visit (HOSPITAL_COMMUNITY)

## 2024-08-02 NOTE — Progress Notes (Signed)
 Patient Care Team: Rosamond Leta NOVAK, MD as PCP - General (Internal Medicine) Alvan Dorn FALCON, MD as PCP - Cardiology (Cardiology) Celestia Joesph SQUIBB, RN as Oncology Nurse Navigator (Oncology) Davonna Siad, MD as Medical Oncologist (Medical Oncology)  Clinic Day:  08/03/2024  Referring physician: Rosamond Leta NOVAK, MD   CHIEF COMPLAINT:  CC: Metastatic breast cancer ER/PR positive and HER2 equivocal with newly diagnosed pancreatic adenocarcinoma  Stacey [name] 78 y.o. female was transferred to my care after her prior physician has left.   ASSESSMENT & PLAN:   Assessment & Plan: Stacey [name]  is a 78 y.o. 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  -Surgery would be the preferred treatment option with or without chemotherapy.  Other treatment options would be palliative radiation.  These were discussed in detail with the patient - Patient reported that she does not want to undergo surgery or aggressive chemotherapy.  She reported that she wanted to focus on quality of life rather than quantity at this point - Will order genetic screening for hydrated pancreatic cancer - As per patient's wishes will refer to hospice care for symptom management Malignant neoplasm of right breast in female, estrogen receptor positive, unspecified site of breast [facility]) Patient has new breast lesion in the right breast. Currently on fulvestrant  and denosumab  for metastatic breast cancer  - Considering patient would be transitioning to hospice care, I do not see additional workup for this new breast mass will align with patient's goals. -We will withdraw further treatment for the patient as per her wishes and transition to hospice Metastasis to bone [facility]) Patient currently on denosumab  every 3 weeks  -Continue vitamin D   and calcium  supplementation  Left renal mass Patient likely has renal cell carcinoma initially found on a CT scan from 2021  -Patient decided to just monitor this and not undergo further workup Goals of care, counseling/discussion Discussed goals of care extensively with the patient considering she has metastatic breast cancer, newly diagnosed pancreatic cancer which is aggressive and 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 -Hospice care chosen for symptom management. -No chemotherapy or surgery for pancreatic cancer.  -Breast cancer treatment discontinued.  -Prefers home care with hospice support. - Considers to be DNI/DNR at this time    The patient understands the plans discussed today and is in agreement with them.  She knows to contact our office if she develops concerns prior to her next appointment.  100 minutes of total time was spent for this patient encounter, including preparation,review of records,  face-to-face counseling with the patient and coordination of care, physical exam, and documentation of the encounter.   Siad Davonna, MD  [facility] [place] - A DEPT OF [facility] [address] [place] [address] Dept: [phone] Dept Fax: [phone]   Orders Placed This Encounter  Procedures   Genetic Screening Order    Standing Status:   Future    Expected Date:   08/03/2024    Expiration Date:   08/03/2025    Release to patient:   Immediate     ONCOLOGY HISTORY:   I have reviewed her chart and materials related to her cancer extensively and collaborated history with the patient. Summary of oncologic  history is as follows:   Pancreatic adenocarcinoma:  -06/07/2024: CT AP w contrast: Pancreatic ductal dilatation with prominence of the pancreatic head and mild stranding around the pancreatic head. This may indicate  pancreatitis and is progressed since prior study. -06/08/2024: MRI abdomen/MRCP:There is subtle mildly hypoenhancing area in the pancreatic head region. No metastatic disease identified within the abdomen.  -07/14/2024: Pancreas, head, FNA:  Pathology: Adenocarcinoma  Metastatic right breast cancer to the bones, ER/PR positive and HER2 equivocal:  -01/07/2016: Right, breast: Biopsy: - Pathology: Invasive adenocarcinoma of the breast - ER: Positive, PR: Positive, HER2: Equivocal (2+), FISH: Equivocal  -Breast, right, axillary lymph node biopsy: Lymph node with metastatic adenocarcinoma -01/07/2016: Oncotype Dx: 9 -05/30/2016: T2 bone lesion biopsy: Hypocellular sample with scattered tumor cells consistent with involvement by the patient's known breast primary  - 02/25/2016- 01/2019: Palliative therapy with letrozole  was started at [facility] under the direction of Dr. Burnard Ranger -01/2019-Current: Transition to fulvestrant  for progressive disease.  Addition of Ibrance was discussed but did not start because of concerns about the cost of medication  Probable RCC:  -04/24/2020: CT AP: 1.7 cm nonspecific lesion in left kidney shows mild increase in size, and could represent a solid neoplasm complex cyst. -Patient decided to just observe this   Current Treatment:  Fulvestrant  and denosumab   INTERVAL HISTORY:   Stacey [name] is here today for follow up and to establish care with me. Patient is accompanied by her daughter and sister today.   Patient reported that she was recently diagnosed of pancreatic cancer and decided against aggressive treatment options such as surgery or chemotherapy, prioritizing quality of life or life extension.  She also has a history of metastatic breast cancer for which she is receiving fulvestrant  and denosumab .  She reported that she recently found a small lump in her right breast that is concerning for recurrence of breast cancer.  Patient has been bedbound  for several years at this point and is fully dependent on her son for her care.  She wants to focus on quality of life rather than prolonging life and has discussed hospice care with her family.  She is considering hospice care to manage symptoms as her disease progresses.  I have reviewed the past medical history, past surgical history, social history and family history with the patient and they are unchanged from previous note.  ALLERGIES:  is allergic to albuterol , brilinta  [ticagrelor ], and budesonide .  MEDICATIONS:  Current Outpatient Medications  Medication Sig Dispense Refill   ALPRAZolam  (XANAX ) 0.5 MG tablet Take 1 tablet (0.5 mg total) by mouth 2 (two) times daily as needed for anxiety or sleep. 30 tablet 0   apixaban  (ELIQUIS ) 2.5 MG TABS tablet Take 2.5 mg by mouth daily.     atorvastatin  (LIPITOR ) 80 MG tablet Take 80 mg by mouth daily.     b complex vitamins capsule Take 1 capsule by mouth daily.     carboxymethylcellulose (REFRESH PLUS) 0.5 % SOLN Place 1 drop into both eyes 3 (three) times daily as needed (Dry Eyes).     clopidogrel  (PLAVIX ) 75 MG tablet Take 1 tablet (75 mg total) by mouth daily. 90 tablet 2   DULoxetine  (CYMBALTA ) 30 MG capsule Take 30 mg by mouth 2 (two) times daily.     ferrous sulfate  325 (65 FE) MG EC tablet Take 1 tablet (325 mg total) by mouth daily with breakfast. 30 tablet 3   HYDROmorphone  (DILAUDID ) 2 MG tablet Take 2 mg by mouth every 6 (  six) hours as needed for severe pain (pain score 7-10). Via pump     methocarbamol  (ROBAXIN ) 500 MG tablet Take 500 mg by mouth every 6 (six) hours as needed for muscle spasms.     metoprolol  succinate (TOPROL -XL) 25 MG 24 hr tablet Take 3 tablets (75 mg total) by mouth daily. 270 tablet 2   nitroGLYCERIN  (NITROSTAT ) 0.4 MG SL tablet Place 0.4 mg under the tongue every 5 (five) minutes as needed for chest pain.     ondansetron  (ZOFRAN ) 4 MG tablet Take 4 mg by mouth every 8 (eight) hours as needed for nausea or  vomiting.     pantoprazole  (PROTONIX ) 40 MG tablet Take 1 tablet (40 mg total) by mouth 2 (two) times daily. 60 tablet 5   polyethylene glycol (MIRALAX  / GLYCOLAX ) 17 g packet Take 17 g by mouth 2 (two) times daily. 180 each 3   senna-docusate (SENOKOT-S) 8.6-50 MG tablet Take 2 tablets by mouth at bedtime. 180 tablet 3   sucralfate  (CARAFATE ) 1 GM/10ML suspension Take 10 mLs (1 g total) by mouth 4 (four) times daily -  before meals and at bedtime. 1260 each 0   No current facility-administered medications for this visit.    REVIEW OF SYSTEMS:   Constitutional: Denies fevers, chills or abnormal weight loss Eyes: Denies blurriness of vision Ears, nose, mouth, throat, and face: Denies mucositis or sore throat Respiratory: Denies cough, dyspnea or wheezes Cardiovascular: Denies palpitation, chest discomfort or lower extremity swelling Gastrointestinal:  Denies nausea, heartburn or change in bowel habits Skin: Denies abnormal skin rashes Lymphatics: Denies new lymphadenopathy or easy bruising Neurological:Denies numbness, tingling or new weaknesses Behavioral/Psych: Mood is stable, no new changes  All other systems were reviewed with the patient and are negative.   VITALS:  Blood pressure (!) 123/45, pulse 84, temperature 98 F (36.7 C), temperature source Oral, resp. rate 18, SpO2 100%.  Wt Readings from Last 3 Encounters:  07/14/24 190 lb 14.7 oz (86.6 kg)  06/30/24 191 lb (86.6 kg)  06/06/24 174 lb 2.6 oz (79 kg)    There is no height or weight on file to calculate BMI.  Performance status (ECOG): 4 - Bedbound  PHYSICAL EXAM:   GENERAL:alert, no distress and comfortable BREAST: Right breast: New lump palpated in the upper outer quadrant of breast, nontender, no discharge.  Left breast: Normal exam.  No axillary lymphadenopathy palpated LYMPH:  no palpable lymphadenopathy in the cervical, axillary or inguinal LUNGS: clear to auscultation and percussion with normal breathing  effort HEART: regular rate & rhythm and no murmurs and no lower extremity edema ABDOMEN:abdomen soft, non-tender and normal bowel sounds Musculoskeletal:no cyanosis of digits and no clubbing  NEURO: alert & oriented x 3 with fluent speech, no focal motor/sensory deficits  LABORATORY DATA:  I have reviewed the data as listed   Lab Results  Component Value Date   WBC 4.9 08/03/2024   NEUTROABS 3.1 08/03/2024   HGB 11.2 (L) 08/03/2024   HCT 34.2 (L) 08/03/2024   MCV 101.5 (H) 08/03/2024   PLT 217 08/03/2024      Chemistry      Component Value Date/Time   NA 139 08/03/2024 1217   K 3.2 (L) 08/03/2024 1217   CL 103 08/03/2024 1217   CO2 25 08/03/2024 1217   BUN 11 08/03/2024 1217   CREATININE 0.47 08/03/2024 1217      Component Value Date/Time   CALCIUM  8.1 (L) 08/03/2024 1217   ALKPHOS 146 (H) 08/03/2024 1217  AST 39 08/03/2024 1217   ALT 18 08/03/2024 1217   BILITOT 0.8 08/03/2024 1217        RADIOGRAPHIC STUDIES: I have personally reviewed the radiological report as listed below  MR 3D Recon At Scanner CLINICAL DATA:  pancreatitis, pancreatic duct dilation, history of cholangitis with current biliary stent in place; 855384 Pain 144615 pancreatitis,.  EXAM: MRI ABDOMEN WITHOUT AND WITH CONTRAST (INCLUDING MRCP)  TECHNIQUE: Multiplanar multisequence MR imaging of the abdomen was performed both before and after the administration of intravenous contrast. Heavily T2-weighted images of the biliary and pancreatic ducts were obtained, and three-dimensional MRCP images were rendered by post processing.  CONTRAST:  7mL GADAVIST  GADOBUTROL  1 MMOL/ML IV SOLN  COMPARISON:  CT scan abdomen and pelvis from 06/07/2024.  FINDINGS: Lower chest: Unremarkable MR appearance to the lung bases. No pleural effusion. No pericardial effusion. Normal heart size.  Hepatobiliary: The liver is normal in size and configuration. No intrahepatic bile duct dilation. There is  susceptibility artifacts in the extrahepatic bile duct from metallic stent. There is pneumobilia mainly in the left lobe ducts. The gallbladder is surgically absent. There are susceptibility artifacts in the gallbladder fossa region, from cholecystectomy clips.  Pancreas: Redemonstration of diffuse a trophy of the pancreas with resultant dilated main pancreatic duct measuring up to 6-7 mm in the neck/body region. There is subtle mildly hypoenhancing area measuring at least 1.3 x 1.6 cm in the pancreatic head on the left side of the main pancreatic duct. There is associated after up cutoff of the main pancreatic duct. The area appears slightly T2 hyperintense. It is not well evaluated on the diffusion-weighted images due to extensive artifacts. This is indeterminate in etiology and differential diagnosis includes focal pancreatic adenocarcinoma versus mass forming chronic pancreatitis. EUS and FNA is recommended for differentiation.  No peripancreatic fat stranding or peripancreatic walled-off collections. There is trace amount of fluid in the left paracolic gutter, likely sequela of pancreatitis.  Spleen:  Within normal limits in size and appearance. No focal mass.  Adrenals/Urinary Tract: Unremarkable adrenal glands. Redemonstration of previously seen heterogeneously enhancing 2.5 x 2.8 x 3.1 cm mass arising from the left kidney upper pole/interpolar region, anterolaterally. There is a simple cyst arising from the left kidney lower pole, posteriorly measuring up to 1.3 x 1.4 cm. Bilateral kidneys are otherwise essentially unremarkable. No hydronephrosis. Right extrarenal pelvis noted.  Stomach/Bowel: Visualized portions within the abdomen are unremarkable. No disproportionate dilation of bowel loops.  Vascular/Lymphatic: No pathologically enlarged lymph nodes identified. No abdominal aortic aneurysm demonstrated. No ascites.  Other:  None.  Musculoskeletal: No suspicious bone  lesions identified. There is moderate compression deformity of L1 vertebrae, better evaluated on the CT scan exam.  IMPRESSION: 1. There is subtle mildly hypoenhancing area in the pancreatic head region, which is indeterminate in etiology and differential diagnosis includes focal pancreatic adenocarcinoma versus mass forming chronic pancreatitis. Correlation with EUS and FNA is recommended for differentiation. 2. Redemonstration of previously seen heterogeneously enhancing 2.5 x 2.8 x 3.1 cm mass arising from the left kidney upper pole/interpolar region, anterolaterally, compatible with renal cell carcinoma. 3. No metastatic disease identified within the abdomen.  Electronically Signed   By: Ree Molt M.D.   On: 06/08/2024 09:43 MR ABDOMEN MRCP W WO CONTAST CLINICAL DATA:  pancreatitis, pancreatic duct dilation, history of cholangitis with current biliary stent in place; 855384 Pain 144615 pancreatitis,.  EXAM: MRI ABDOMEN WITHOUT AND WITH CONTRAST (INCLUDING MRCP)  TECHNIQUE: Multiplanar multisequence MR imaging of the abdomen  was performed both before and after the administration of intravenous contrast. Heavily T2-weighted images of the biliary and pancreatic ducts were obtained, and three-dimensional MRCP images were rendered by post processing.  CONTRAST:  7mL GADAVIST  GADOBUTROL  1 MMOL/ML IV SOLN  COMPARISON:  CT scan abdomen and pelvis from 06/07/2024.  FINDINGS: Lower chest: Unremarkable MR appearance to the lung bases. No pleural effusion. No pericardial effusion. Normal heart size.  Hepatobiliary: The liver is normal in size and configuration. No intrahepatic bile duct dilation. There is susceptibility artifacts in the extrahepatic bile duct from metallic stent. There is pneumobilia mainly in the left lobe ducts. The gallbladder is surgically absent. There are susceptibility artifacts in the gallbladder fossa region, from cholecystectomy  clips.  Pancreas: Redemonstration of diffuse a trophy of the pancreas with resultant dilated main pancreatic duct measuring up to 6-7 mm in the neck/body region. There is subtle mildly hypoenhancing area measuring at least 1.3 x 1.6 cm in the pancreatic head on the left side of the main pancreatic duct. There is associated after up cutoff of the main pancreatic duct. The area appears slightly T2 hyperintense. It is not well evaluated on the diffusion-weighted images due to extensive artifacts. This is indeterminate in etiology and differential diagnosis includes focal pancreatic adenocarcinoma versus mass forming chronic pancreatitis. EUS and FNA is recommended for differentiation.  No peripancreatic fat stranding or peripancreatic walled-off collections. There is trace amount of fluid in the left paracolic gutter, likely sequela of pancreatitis.  Spleen:  Within normal limits in size and appearance. No focal mass.  Adrenals/Urinary Tract: Unremarkable adrenal glands. Redemonstration of previously seen heterogeneously enhancing 2.5 x 2.8 x 3.1 cm mass arising from the left kidney upper pole/interpolar region, anterolaterally. There is a simple cyst arising from the left kidney lower pole, posteriorly measuring up to 1.3 x 1.4 cm. Bilateral kidneys are otherwise essentially unremarkable. No hydronephrosis. Right extrarenal pelvis noted.  Stomach/Bowel: Visualized portions within the abdomen are unremarkable. No disproportionate dilation of bowel loops.  Vascular/Lymphatic: No pathologically enlarged lymph nodes identified. No abdominal aortic aneurysm demonstrated. No ascites.  Other:  None.  Musculoskeletal: No suspicious bone lesions identified. There is moderate compression deformity of L1 vertebrae, better evaluated on the CT scan exam.  IMPRESSION: 1. There is subtle mildly hypoenhancing area in the pancreatic head region, which is indeterminate in etiology and  differential diagnosis includes focal pancreatic adenocarcinoma versus mass forming chronic pancreatitis. Correlation with EUS and FNA is recommended for differentiation. 2. Redemonstration of previously seen heterogeneously enhancing 2.5 x 2.8 x 3.1 cm mass arising from the left kidney upper pole/interpolar region, anterolaterally, compatible with renal cell carcinoma. 3. No metastatic disease identified within the abdomen.  Electronically Signed   By: Ree Molt M.D.   On: 06/08/2024 09:43

## 2024-08-03 ENCOUNTER — Telehealth: Payer: Self-pay | Admitting: Licensed Clinical Social Worker

## 2024-08-03 ENCOUNTER — Inpatient Hospital Stay

## 2024-08-03 ENCOUNTER — Inpatient Hospital Stay: Attending: Hematology | Admitting: Oncology

## 2024-08-03 VITALS — BP 123/45 | HR 84 | Temp 98.0°F | Resp 18

## 2024-08-03 DIAGNOSIS — Z1732 Human epidermal growth factor receptor 2 negative status: Secondary | ICD-10-CM | POA: Diagnosis not present

## 2024-08-03 DIAGNOSIS — C50911 Malignant neoplasm of unspecified site of right female breast: Secondary | ICD-10-CM | POA: Diagnosis not present

## 2024-08-03 DIAGNOSIS — Z170 Estrogen receptor positive status [ER+]: Secondary | ICD-10-CM | POA: Insufficient documentation

## 2024-08-03 DIAGNOSIS — Z79810 Long term (current) use of selective estrogen receptor modulators (SERMs): Secondary | ICD-10-CM | POA: Insufficient documentation

## 2024-08-03 DIAGNOSIS — Z79899 Other long term (current) drug therapy: Secondary | ICD-10-CM | POA: Diagnosis not present

## 2024-08-03 DIAGNOSIS — Z1721 Progesterone receptor positive status: Secondary | ICD-10-CM | POA: Diagnosis not present

## 2024-08-03 DIAGNOSIS — N2889 Other specified disorders of kidney and ureter: Secondary | ICD-10-CM | POA: Insufficient documentation

## 2024-08-03 DIAGNOSIS — C259 Malignant neoplasm of pancreas, unspecified: Secondary | ICD-10-CM

## 2024-08-03 DIAGNOSIS — C250 Malignant neoplasm of head of pancreas: Secondary | ICD-10-CM | POA: Diagnosis not present

## 2024-08-03 DIAGNOSIS — N649 Disorder of breast, unspecified: Secondary | ICD-10-CM | POA: Diagnosis not present

## 2024-08-03 DIAGNOSIS — Z7189 Other specified counseling: Secondary | ICD-10-CM

## 2024-08-03 DIAGNOSIS — C7951 Secondary malignant neoplasm of bone: Secondary | ICD-10-CM | POA: Insufficient documentation

## 2024-08-03 LAB — COMPREHENSIVE METABOLIC PANEL WITH GFR
ALT: 18 U/L (ref 0–44)
AST: 39 U/L (ref 15–41)
Albumin: 2 g/dL — ABNORMAL LOW (ref 3.5–5.0)
Alkaline Phosphatase: 146 U/L — ABNORMAL HIGH (ref 38–126)
Anion gap: 11 (ref 5–15)
BUN: 11 mg/dL (ref 8–23)
CO2: 25 mmol/L (ref 22–32)
Calcium: 8.1 mg/dL — ABNORMAL LOW (ref 8.9–10.3)
Chloride: 103 mmol/L (ref 98–111)
Creatinine, Ser: 0.47 mg/dL (ref 0.44–1.00)
GFR, Estimated: >60 mL/min (ref >60)
Glucose, Bld: 127 mg/dL — ABNORMAL HIGH (ref 70–99)
Potassium: 3.2 mmol/L — ABNORMAL LOW (ref 3.5–5.1)
Sodium: 139 mmol/L (ref 135–145)
Total Bilirubin: 0.8 mg/dL (ref 0.0–1.2)
Total Protein: 5.3 g/dL — ABNORMAL LOW (ref 6.5–8.1)

## 2024-08-03 LAB — CBC WITH DIFFERENTIAL/PLATELET
Abs Immature Granulocytes: 0.02 K/uL (ref 0.00–0.07)
Basophils Absolute: 0 K/uL (ref 0.0–0.1)
Basophils Relative: 0 %
Eosinophils Absolute: 0.1 K/uL (ref 0.0–0.5)
Eosinophils Relative: 3 %
HCT: 34.2 % — ABNORMAL LOW (ref 36.0–46.0)
Hemoglobin: 11.2 g/dL — ABNORMAL LOW (ref 12.0–15.0)
Immature Granulocytes: 0 %
Lymphocytes Relative: 26 %
Lymphs Abs: 1.3 K/uL (ref 0.7–4.0)
MCH: 33.2 pg (ref 26.0–34.0)
MCHC: 32.7 g/dL (ref 30.0–36.0)
MCV: 101.5 fL — ABNORMAL HIGH (ref 80.0–100.0)
Monocytes Absolute: 0.4 K/uL (ref 0.1–1.0)
Monocytes Relative: 7 %
Neutro Abs: 3.1 K/uL (ref 1.7–7.7)
Neutrophils Relative %: 64 %
Platelets: 217 K/uL (ref 150–400)
RBC: 3.37 MIL/uL — ABNORMAL LOW (ref 3.87–5.11)
RDW: 14 % (ref 11.5–15.5)
WBC: 4.9 K/uL (ref 4.0–10.5)
nRBC: 0 % (ref 0.0–0.2)

## 2024-08-03 NOTE — Patient Instructions (Addendum)
 [facility]  Discharge Instructions  You were seen and examined today by Dr. Davonna. Dr. Davonna is a medical oncologist, meaning that she specializes in the treatment of cancer diagnoses. Dr. Davonna discussed your past medical history, family history of cancers, and the events that led to you being here today.  You were referred back to Dr. Davonna due to a new diagnosis of pancreatic cancer, which is a very aggressive cancer. Unfortunately, you are not a good candidate for chemotherapy nor surgery.  As you know, you are already dealing with Stage IV Breast Cancer, which does appear to be worsening on your Faslodex , meaning that we would need to change that treatment.  Dr. Davonna has discussed a referral to [facility]. Hospice can come into your home on a routine basis, to assess your needs as far as pain or nausea for example. Hospice would focus on your quality of life.  Pancreatic cancer is often genetic, we will draw genetic labs today.  Follow-up as scheduled.  Thank you for choosing [facility] to provide your oncology and hematology care.   To afford each patient quality time with our provider, please arrive at least 15 minutes before your scheduled appointment time. You may need to reschedule your appointment if you arrive late (10 or more minutes). Arriving late affects you and other patients whose appointments are after yours.  Also, if you miss three or more appointments without notifying the office, you may be dismissed from the clinic at the provider's discretion.    Again, thank you for choosing [facility].  Our hope is that these requests will decrease the amount of time that you wait before being seen by our physicians.   If you have a lab appointment with the Cancer Center - please note that after April 8th, all labs will be drawn in the cancer center.  You do not have to check in or  register with the main entrance as you have in the past but will complete your check-in at the cancer center.            _____________________________________________________________  Should you have questions after your visit to [facility], please contact our office at [phone] and follow the prompts.  Our office hours are 8:00 a.m. to 4:30 p.m. Monday - Thursday and 8:00 a.m. to 2:30 p.m. Friday.  Please note that voicemails left after 4:00 p.m. may not be returned until the following business day.  We are closed weekends and all major holidays.  You do have access to a nurse 24-7, just call the main number to the clinic [phone] and do not press any options, hold on the line and a nurse will answer the phone.    For prescription refill requests, have your pharmacy contact our office and allow 72 hours.    Masks are no longer required in the cancer centers. If you would like for your care team to wear a mask while they are taking care of you, please let them know. You may have one support person who is at least 77 years old accompany you for your appointments.

## 2024-08-03 NOTE — Progress Notes (Signed)
 Per MD and treatment team pt is no longer getting xgeva  and faslodex  injection. All follow ups as scheduled.   Kristi Hyer

## 2024-08-03 NOTE — Telephone Encounter (Signed)
 [facility] CancerNext-Expanded+RNA Panel order placed today.   Dena Cary, MS, [facility] Genetic Counselor [email or web address] Phone: [phone]

## 2024-08-04 LAB — CANCER ANTIGEN 15-3: CA 15-3: 24.4 U/mL (ref 0.0–25.0)

## 2024-08-07 ENCOUNTER — Encounter (HOSPITAL_COMMUNITY): Payer: Self-pay | Admitting: Oncology

## 2024-08-07 DIAGNOSIS — C259 Malignant neoplasm of pancreas, unspecified: Secondary | ICD-10-CM | POA: Insufficient documentation

## 2024-08-07 NOTE — Assessment & Plan Note (Addendum)
 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 -Hospice care chosen for symptom management. -No chemotherapy or surgery for pancreatic cancer.  -Breast cancer treatment discontinued.  -Prefers home care with hospice support. - Considers to be DNI/DNR at this time

## 2024-08-07 NOTE — Assessment & Plan Note (Signed)
 Patient has new breast lesion in the right breast. Currently on fulvestrant  and denosumab  for metastatic breast cancer  - Considering patient would be transitioning to hospice care, I do not see additional workup for this new breast mass will align with patient's goals. -We will withdraw further treatment for the patient as per her wishes and transition to hospice

## 2024-08-07 NOTE — Assessment & Plan Note (Signed)
 Patient currently on denosumab  every 3 weeks  -Continue vitamin D  and calcium  supplementation

## 2024-08-07 NOTE — Assessment & Plan Note (Signed)
 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  -Surgery would be the preferred treatment option with or without chemotherapy.  Other treatment options would be palliative radiation.  These were discussed in detail with the patient - Patient reported that she does not want to undergo surgery or aggressive chemotherapy.  She reported that she wanted to focus on quality of life rather than quantity at this point - Will order genetic screening for hydrated pancreatic cancer - As per patient's wishes will refer to hospice care for symptom management

## 2024-08-07 NOTE — Assessment & Plan Note (Addendum)
 Patient likely has renal cell carcinoma initially found on a CT scan from 2021  -Patient decided to just monitor this and not undergo further workup

## 2024-08-11 ENCOUNTER — Inpatient Hospital Stay: Admitting: Oncology

## 2024-08-11 ENCOUNTER — Inpatient Hospital Stay (HOSPITAL_COMMUNITY)
Admission: EM | Admit: 2024-08-11 | Discharge: 2024-08-17 | DRG: 689 | Disposition: A | Discharge status: Other Acute Inpatient Hospital | Attending: Internal Medicine | Admitting: Internal Medicine

## 2024-08-11 ENCOUNTER — Emergency Department (HOSPITAL_COMMUNITY)

## 2024-08-11 ENCOUNTER — Other Ambulatory Visit: Payer: Self-pay

## 2024-08-11 DIAGNOSIS — Z515 Encounter for palliative care: Secondary | ICD-10-CM

## 2024-08-11 DIAGNOSIS — U071 COVID-19: Secondary | ICD-10-CM | POA: Diagnosis present

## 2024-08-11 DIAGNOSIS — F419 Anxiety disorder, unspecified: Secondary | ICD-10-CM | POA: Diagnosis present

## 2024-08-11 DIAGNOSIS — G893 Neoplasm related pain (acute) (chronic): Secondary | ICD-10-CM | POA: Diagnosis present

## 2024-08-11 DIAGNOSIS — N3001 Acute cystitis with hematuria: Secondary | ICD-10-CM | POA: Diagnosis not present

## 2024-08-11 DIAGNOSIS — E785 Hyperlipidemia, unspecified: Secondary | ICD-10-CM | POA: Diagnosis present

## 2024-08-11 DIAGNOSIS — C259 Malignant neoplasm of pancreas, unspecified: Secondary | ICD-10-CM | POA: Diagnosis present

## 2024-08-11 DIAGNOSIS — Z825 Family history of asthma and other chronic lower respiratory diseases: Secondary | ICD-10-CM

## 2024-08-11 DIAGNOSIS — Z66 Do not resuscitate: Secondary | ICD-10-CM | POA: Diagnosis present

## 2024-08-11 DIAGNOSIS — J449 Chronic obstructive pulmonary disease, unspecified: Secondary | ICD-10-CM | POA: Diagnosis present

## 2024-08-11 DIAGNOSIS — N182 Chronic kidney disease, stage 2 (mild): Secondary | ICD-10-CM | POA: Diagnosis present

## 2024-08-11 DIAGNOSIS — Z9049 Acquired absence of other specified parts of digestive tract: Secondary | ICD-10-CM

## 2024-08-11 DIAGNOSIS — C649 Malignant neoplasm of unspecified kidney, except renal pelvis: Secondary | ICD-10-CM | POA: Diagnosis present

## 2024-08-11 DIAGNOSIS — F32A Depression, unspecified: Secondary | ICD-10-CM | POA: Diagnosis present

## 2024-08-11 DIAGNOSIS — N390 Urinary tract infection, site not specified: Secondary | ICD-10-CM | POA: Diagnosis not present

## 2024-08-11 DIAGNOSIS — R1030 Lower abdominal pain, unspecified: Principal | ICD-10-CM

## 2024-08-11 DIAGNOSIS — Z888 Allergy status to other drugs, medicaments and biological substances status: Secondary | ICD-10-CM

## 2024-08-11 DIAGNOSIS — I252 Old myocardial infarction: Secondary | ICD-10-CM

## 2024-08-11 DIAGNOSIS — Z7401 Bed confinement status: Secondary | ICD-10-CM

## 2024-08-11 DIAGNOSIS — Z86711 Personal history of pulmonary embolism: Secondary | ICD-10-CM

## 2024-08-11 DIAGNOSIS — Z79899 Other long term (current) drug therapy: Secondary | ICD-10-CM

## 2024-08-11 DIAGNOSIS — R739 Hyperglycemia, unspecified: Secondary | ICD-10-CM | POA: Diagnosis not present

## 2024-08-11 DIAGNOSIS — F39 Unspecified mood [affective] disorder: Secondary | ICD-10-CM | POA: Diagnosis present

## 2024-08-11 DIAGNOSIS — R110 Nausea: Secondary | ICD-10-CM | POA: Diagnosis present

## 2024-08-11 DIAGNOSIS — I130 Hypertensive heart and chronic kidney disease with heart failure and stage 1 through stage 4 chronic kidney disease, or unspecified chronic kidney disease: Secondary | ICD-10-CM | POA: Diagnosis present

## 2024-08-11 DIAGNOSIS — Z7901 Long term (current) use of anticoagulants: Secondary | ICD-10-CM

## 2024-08-11 DIAGNOSIS — Z6832 Body mass index (BMI) 32.0-32.9, adult: Secondary | ICD-10-CM

## 2024-08-11 DIAGNOSIS — E66811 Obesity, class 1: Secondary | ICD-10-CM | POA: Diagnosis present

## 2024-08-11 DIAGNOSIS — Z8249 Family history of ischemic heart disease and other diseases of the circulatory system: Secondary | ICD-10-CM

## 2024-08-11 DIAGNOSIS — C50919 Malignant neoplasm of unspecified site of unspecified female breast: Secondary | ICD-10-CM | POA: Diagnosis present

## 2024-08-11 DIAGNOSIS — I2510 Atherosclerotic heart disease of native coronary artery without angina pectoris: Secondary | ICD-10-CM | POA: Diagnosis present

## 2024-08-11 DIAGNOSIS — Z955 Presence of coronary angioplasty implant and graft: Secondary | ICD-10-CM

## 2024-08-11 DIAGNOSIS — Z7902 Long term (current) use of antithrombotics/antiplatelets: Secondary | ICD-10-CM

## 2024-08-11 DIAGNOSIS — B9620 Unspecified Escherichia coli [E. coli] as the cause of diseases classified elsewhere: Secondary | ICD-10-CM | POA: Diagnosis present

## 2024-08-11 DIAGNOSIS — E8809 Other disorders of plasma-protein metabolism, not elsewhere classified: Secondary | ICD-10-CM | POA: Diagnosis present

## 2024-08-11 DIAGNOSIS — I480 Paroxysmal atrial fibrillation: Secondary | ICD-10-CM | POA: Diagnosis present

## 2024-08-11 DIAGNOSIS — Z87891 Personal history of nicotine dependence: Secondary | ICD-10-CM

## 2024-08-11 DIAGNOSIS — C7951 Secondary malignant neoplasm of bone: Secondary | ICD-10-CM | POA: Diagnosis present

## 2024-08-11 DIAGNOSIS — D649 Anemia, unspecified: Secondary | ICD-10-CM | POA: Diagnosis present

## 2024-08-11 LAB — CBC WITH DIFFERENTIAL/PLATELET
Abs Immature Granulocytes: 0.02 K/uL (ref 0.00–0.07)
Basophils Absolute: 0 K/uL (ref 0.0–0.1)
Basophils Relative: 0 %
Eosinophils Absolute: 0.2 K/uL (ref 0.0–0.5)
Eosinophils Relative: 3 %
HCT: 34.4 % — ABNORMAL LOW (ref 36.0–46.0)
Hemoglobin: 10.8 g/dL — ABNORMAL LOW (ref 12.0–15.0)
Immature Granulocytes: 0 %
Lymphocytes Relative: 19 %
Lymphs Abs: 1.1 K/uL (ref 0.7–4.0)
MCH: 32.3 pg (ref 26.0–34.0)
MCHC: 31.4 g/dL (ref 30.0–36.0)
MCV: 103 fL — ABNORMAL HIGH (ref 80.0–100.0)
Monocytes Absolute: 0.4 K/uL (ref 0.1–1.0)
Monocytes Relative: 7 %
Neutro Abs: 3.9 K/uL (ref 1.7–7.7)
Neutrophils Relative %: 71 %
Platelets: 240 K/uL (ref 150–400)
RBC: 3.34 MIL/uL — ABNORMAL LOW (ref 3.87–5.11)
RDW: 14.3 % (ref 11.5–15.5)
WBC: 5.6 K/uL (ref 4.0–10.5)
nRBC: 0 % (ref 0.0–0.2)

## 2024-08-11 LAB — BASIC METABOLIC PANEL WITH GFR
Anion gap: 12 (ref 5–15)
BUN: 12 mg/dL (ref 8–23)
CO2: 27 mmol/L (ref 22–32)
Calcium: 8.6 mg/dL — ABNORMAL LOW (ref 8.9–10.3)
Chloride: 105 mmol/L (ref 98–111)
Creatinine, Ser: 0.5 mg/dL (ref 0.44–1.00)
GFR, Estimated: >60 mL/min (ref >60)
Glucose, Bld: 165 mg/dL — ABNORMAL HIGH (ref 70–99)
Potassium: 4.3 mmol/L (ref 3.5–5.1)
Sodium: 144 mmol/L (ref 135–145)

## 2024-08-11 LAB — URINALYSIS, ROUTINE W REFLEX MICROSCOPIC
Bilirubin Urine: NEGATIVE
Glucose, UA: NEGATIVE mg/dL
Hgb urine dipstick: NEGATIVE
Ketones, ur: NEGATIVE mg/dL
Nitrite: NEGATIVE
Protein, ur: 30 mg/dL — AB
Specific Gravity, Urine: 1.025 (ref 1.005–1.030)
WBC, UA: >50 WBC/hpf (ref 0–5)
pH: 6 (ref 5.0–8.0)

## 2024-08-11 LAB — LIPASE, BLOOD: Lipase: 22 U/L (ref 11–51)

## 2024-08-11 MED ORDER — IOHEXOL 300 MG/ML  SOLN
100.0000 mL | Freq: Once | INTRAMUSCULAR | Status: AC | PRN
  Administered 2024-08-11: 100 mL via INTRAVENOUS

## 2024-08-11 MED ORDER — HYDROMORPHONE HCL 1 MG/ML IJ SOLN
1.0000 mg | Freq: Once | INTRAMUSCULAR | Status: AC
  Administered 2024-08-11: 1 mg via INTRAVENOUS
  Filled 2024-08-11: qty 1

## 2024-08-11 MED ORDER — HYDROMORPHONE HCL 1 MG/ML IJ SOLN
0.5000 mg | Freq: Once | INTRAMUSCULAR | Status: AC
  Administered 2024-08-11: 0.5 mg via INTRAVENOUS
  Filled 2024-08-11: qty 0.5

## 2024-08-11 MED ORDER — SODIUM CHLORIDE 0.9 % IV SOLN
2.0000 g | Freq: Once | INTRAVENOUS | Status: AC
  Administered 2024-08-11: 2 g via INTRAVENOUS
  Filled 2024-08-11: qty 20

## 2024-08-11 MED ORDER — SODIUM CHLORIDE 0.9 % IV BOLUS
1000.0000 mL | Freq: Once | INTRAVENOUS | Status: AC
  Administered 2024-08-11: 1000 mL via INTRAVENOUS

## 2024-08-11 MED ORDER — ONDANSETRON HCL 4 MG/2ML IJ SOLN
4.0000 mg | Freq: Once | INTRAMUSCULAR | Status: AC
  Administered 2024-08-11: 4 mg via INTRAVENOUS
  Filled 2024-08-11: qty 2

## 2024-08-11 NOTE — ED Notes (Signed)
 Received report from [employer]. Pt resting. Nad. States pain meds just received hasn't helped yet. No obvious ss of pain noted. A/o. Slightly pale.

## 2024-08-11 NOTE — ED Triage Notes (Signed)
 Pt presented to ED via [facility]. Pt complaining of abd pain across her entire mid abdominal area. Pt believes she could be having another ulcer. Pt has had this abd pain with N/V for the past two days. Pt has a intrathecal pain pump placed in her right lower abd quadrant. Pt has history of spinal cancer and as of two weeks ago pt was diagnosed with pancreatic cancer. Pt's last bm was today and pt states it was solid and normal for her. Bowel sounds present in all quadrants.

## 2024-08-11 NOTE — ED Notes (Signed)
 Pt just returned from ct. nad

## 2024-08-12 ENCOUNTER — Encounter (HOSPITAL_COMMUNITY): Payer: Self-pay | Admitting: Oncology

## 2024-08-12 ENCOUNTER — Encounter (HOSPITAL_COMMUNITY): Payer: Self-pay | Admitting: Internal Medicine

## 2024-08-12 DIAGNOSIS — G893 Neoplasm related pain (acute) (chronic): Secondary | ICD-10-CM

## 2024-08-12 DIAGNOSIS — N3000 Acute cystitis without hematuria: Secondary | ICD-10-CM

## 2024-08-12 DIAGNOSIS — N390 Urinary tract infection, site not specified: Secondary | ICD-10-CM | POA: Diagnosis present

## 2024-08-12 DIAGNOSIS — Z79899 Other long term (current) drug therapy: Secondary | ICD-10-CM | POA: Diagnosis not present

## 2024-08-12 DIAGNOSIS — Z515 Encounter for palliative care: Secondary | ICD-10-CM | POA: Diagnosis not present

## 2024-08-12 DIAGNOSIS — R52 Pain, unspecified: Secondary | ICD-10-CM

## 2024-08-12 LAB — HEPATIC FUNCTION PANEL
ALT: 19 U/L (ref 0–44)
AST: 40 U/L (ref 15–41)
Albumin: 2.2 g/dL — ABNORMAL LOW (ref 3.5–5.0)
Alkaline Phosphatase: 123 U/L (ref 38–126)
Bilirubin, Direct: 0.2 mg/dL (ref 0.0–0.2)
Indirect Bilirubin: 0.6 mg/dL (ref 0.3–0.9)
Total Bilirubin: 0.8 mg/dL (ref 0.0–1.2)
Total Protein: 5.4 g/dL — ABNORMAL LOW (ref 6.5–8.1)

## 2024-08-12 MED ORDER — MELATONIN 3 MG PO TABS
6.0000 mg | ORAL_TABLET | Freq: Every evening | ORAL | Status: DC | PRN
  Administered 2024-08-14: 6 mg via ORAL
  Filled 2024-08-12: qty 2

## 2024-08-12 MED ORDER — ACETAMINOPHEN 500 MG PO TABS
1000.0000 mg | ORAL_TABLET | Freq: Four times a day (QID) | ORAL | Status: DC | PRN
  Administered 2024-08-14: 1000 mg via ORAL
  Filled 2024-08-12: qty 2

## 2024-08-12 MED ORDER — HYDROMORPHONE HCL 1 MG/ML IJ SOLN
1.0000 mg | INTRAMUSCULAR | Status: DC | PRN
  Administered 2024-08-12 – 2024-08-13 (×9): 1 mg via INTRAVENOUS
  Filled 2024-08-12 (×9): qty 1

## 2024-08-12 MED ORDER — ATORVASTATIN CALCIUM 40 MG PO TABS
80.0000 mg | ORAL_TABLET | Freq: Every day | ORAL | Status: DC
  Administered 2024-08-12 – 2024-08-17 (×6): 80 mg via ORAL
  Filled 2024-08-12 (×6): qty 2

## 2024-08-12 MED ORDER — HYDROMORPHONE HCL 2 MG PO TABS: 2.0000 mg | ORAL_TABLET | ORAL | Status: DC | PRN

## 2024-08-12 MED ORDER — CLOPIDOGREL BISULFATE 75 MG PO TABS
75.0000 mg | ORAL_TABLET | Freq: Every day | ORAL | Status: DC
Start: 2024-08-12 — End: 2024-08-17
  Administered 2024-08-12 – 2024-08-17 (×6): 75 mg via ORAL
  Filled 2024-08-12 (×6): qty 1

## 2024-08-12 MED ORDER — METOPROLOL SUCCINATE ER 50 MG PO TB24
75.0000 mg | ORAL_TABLET | Freq: Every day | ORAL | Status: DC
Start: 2024-08-12 — End: 2024-08-17
  Administered 2024-08-12 – 2024-08-17 (×6): 75 mg via ORAL
  Filled 2024-08-12 (×6): qty 1

## 2024-08-12 MED ORDER — ALPRAZOLAM 0.5 MG PO TABS
0.5000 mg | ORAL_TABLET | Freq: Two times a day (BID) | ORAL | Status: DC | PRN
  Administered 2024-08-12 – 2024-08-17 (×6): 0.5 mg via ORAL
  Filled 2024-08-12 (×6): qty 1

## 2024-08-12 MED ORDER — APIXABAN 5 MG PO TABS
5.0000 mg | ORAL_TABLET | Freq: Two times a day (BID) | ORAL | Status: DC
  Administered 2024-08-12 – 2024-08-17 (×11): 5 mg via ORAL
  Filled 2024-08-12 (×11): qty 1

## 2024-08-12 MED ORDER — ALBUTEROL SULFATE (2.5 MG/3ML) 0.083% IN NEBU: 2.5000 mg | INHALATION_SOLUTION | RESPIRATORY_TRACT | Status: DC | PRN

## 2024-08-12 MED ORDER — ALUM & MAG HYDROXIDE-SIMETH 200-200-20 MG/5ML PO SUSP
30.0000 mL | ORAL | Status: DC | PRN
  Administered 2024-08-12 – 2024-08-17 (×2): 30 mL via ORAL
  Filled 2024-08-12 (×2): qty 30

## 2024-08-12 MED ORDER — HYDROMORPHONE HCL 2 MG PO TABS: 4.0000 mg | ORAL_TABLET | ORAL | Status: DC | PRN

## 2024-08-12 MED ORDER — ENOXAPARIN SODIUM 40 MG/0.4ML IJ SOSY: 40.0000 mg | PREFILLED_SYRINGE | INTRAMUSCULAR | Status: DC

## 2024-08-12 MED ORDER — ONDANSETRON HCL 4 MG/2ML IJ SOLN
4.0000 mg | Freq: Four times a day (QID) | INTRAMUSCULAR | Status: DC | PRN
  Administered 2024-08-12 – 2024-08-14 (×3): 4 mg via INTRAVENOUS
  Filled 2024-08-12 (×3): qty 2

## 2024-08-12 MED ORDER — HYDROMORPHONE HCL 1 MG/ML IJ SOLN: 0.7500 mg | INTRAMUSCULAR | Status: DC | PRN

## 2024-08-12 MED ORDER — CEFTRIAXONE SODIUM 1 G IJ SOLR
1.0000 g | INTRAMUSCULAR | Status: DC
  Administered 2024-08-12 – 2024-08-14 (×3): 1 g via INTRAVENOUS
  Filled 2024-08-12 (×3): qty 10

## 2024-08-12 MED ORDER — POLYETHYLENE GLYCOL 3350 17 G PO PACK: 17.0000 g | PACK | Freq: Every day | ORAL | Status: DC | PRN

## 2024-08-12 NOTE — Plan of Care (Signed)

## 2024-08-12 NOTE — Plan of Care (Signed)
  Problem: Activity: Goal: Risk for activity intolerance will decrease Outcome: Progressing   Problem: Coping: Goal: Level of anxiety will decrease Outcome: Not Progressing   

## 2024-08-12 NOTE — TOC Initial Note (Signed)
 Transition of Care [facility]) - Initial/Assessment Note    Patient Details  Name: Stacey [name] MRN: [id number] Date of Birth: 10-10-1946  Transition of [facility]) CM/SW Contact:    Noreen KATHEE Pinal, LCSWA Phone Number: 08/12/2024, 1:34 PM  Clinical Narrative:                  Patient is active with [facility]. Patient was admitted with them 2-3 days ago . Sister , [name] was at bedside with patient. Patient is known to ICM. Patient still lives with her son who assist with care along with hospice. Patient reports that she has a hospital bed and is bed bond. Patient had no questions [facility] with Hospice was updated on DC. ICM will continue to follow.    Expected Discharge Plan: Home w Hospice Care Barriers to Discharge: Continued Medical Work up   Patient Goals and CMS Choice Patient states their goals for this hospitalization and ongoing recovery are:: Home with hospice CMS Medicare.gov Compare Post Acute Care list provided to:: Patient (Sister at bedside ETTER [name] ))        Expected Discharge Plan and Services     Post Acute Care Choice: Hospice Living arrangements for the past 2 months: Single Family Home                                      Prior Living Arrangements/Services Living arrangements for the past 2 months: Single Family Home Lives with:: Adult Children (Son) Patient language and need for interpreter reviewed:: Yes Do you feel safe going back to the place where you live?: Yes      Need for Family Participation in Patient Care: Yes (Comment) Care giver support system in place?: Yes (comment) Current home services: DME, Hospice Criminal Activity/Legal Involvement Pertinent to Current Situation/Hospitalization: No - Comment as needed  Activities of Daily Living   ADL Screening (condition at time of admission) Independently performs ADLs?: No Does the patient have a NEW difficulty with bathing/dressing/toileting/self-feeding that is expected to last >3  days?: No Does the patient have a NEW difficulty with getting in/out of bed, walking, or climbing stairs that is expected to last >3 days?: No Does the patient have a NEW difficulty with communication that is expected to last >3 days?: No Is the patient deaf or have difficulty hearing?: No Does the patient have difficulty seeing, even when wearing glasses/contacts?: No Does the patient have difficulty concentrating, remembering, or making decisions?: No  Permission Sought/Granted      Share Information with NAME: Lashaya      Permission granted to share info w Relationship: Patient     Emotional Assessment Appearance:: Appears stated age Attitude/Demeanor/Rapport: Engaged Affect (typically observed): Accepting, Appropriate Orientation: : Oriented to Self, Oriented to Situation, Oriented to Place, Oriented to  Time Alcohol  / Substance Use: Not Applicable Psych Involvement: No (comment)  Admission diagnosis:  Urinary tract infection [N39.0] Lower abdominal pain [R10.30] Acute cystitis with hematuria [N30.01] Malignant neoplasm of pancreas, unspecified location of malignancy [facility]) [C25.9] Patient Active Problem List   Diagnosis Date Noted   Urinary tract infection 08/12/2024   Pancreatic adenocarcinoma (HCC) 08/07/2024   Pancreatic lesion 06/30/2024   Mucosal abnormality of esophagus 06/30/2024   Gastritis and gastroduodenitis 06/09/2024   Melena 06/08/2024   Acute pancreatitis 06/07/2024   Peptic ulcer disease 05/19/2024   Left renal mass 05/15/2024   Liver abscess 04/22/2024  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   Liver lesion 04/20/2024   Biliary obstruction 04/14/2024   Chronic kidney disease, stage 3b (HCC) 04/14/2024   Atrial fibrillation, chronic (HCC) 04/14/2024   Thrombocytopenia (HCC) 04/14/2024   Hyperkalemia 03/11/2024   Acute on chronic anemia 03/10/2024   UTI (urinary tract infection) 03/09/2024   AKI (acute kidney injury) (HCC) 03/09/2024   Obstructive jaundice  03/09/2024   Pyuria 03/09/2024   Hyponatremia 03/09/2024   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   Ascending cholangitis 03/09/2024   Acute metabolic encephalopathy 03/08/2024   Bronchospasm 03/24/2022   Acute bronchospasm 03/23/2022   Elevated brain natriuretic peptide (BNP) level 03/23/2022   Macrocytic anemia 03/23/2022   Incarcerated umbilical hernia    Cholecystitis    Jaundice    Cholecystitis, acute with cholelithiasis 03/25/2021   Abdominal pain 03/25/2021   Leukocytosis 03/25/2021   Hypokalemia 03/25/2021   Hyperglycemia 03/25/2021   Chronic diastolic CHF (congestive heart failure) (HCC) 03/25/2021   Obesity, Class II, BMI 35-39.9 03/25/2021   Choledocholithiasis    Rectal bleeding 08/23/2020   Supratherapeutic INR 08/23/2020   Left hip pain    Pain of metastatic malignancy 06/27/2020   Malignant neoplasm of right breast in female, estrogen receptor positive (HCC)    Goals of care, counseling/discussion    Advanced care planning/counseling discussion    Spinal stenosis of lumbar region    Metastasis to bone [facility])    Palliative care by specialist    Lumbar pain 04/24/2020   Lumbar radiculopathy 02/16/2020   Palpitations 11/16/2019   Dysuria    Acute bilateral low back pain 10/02/2019   Bipolar 2 disorder, major depressive episode (HCC) 09/01/2019   Adjustment disorder with depressed mood 06/30/2019   Atrial fibrillation with rapid ventricular response (HCC) 11/14/2018   Fall    Obesity, Class III, BMI 40-49.9 (morbid obesity)    Chronic respiratory failure with hypoxia (HCC)    Transaminitis 10/07/2018   PSVT (paroxysmal supraventricular tachycardia) (HCC) 10/07/2018   HLD (hyperlipidemia) 10/07/2018   COPD (chronic obstructive pulmonary disease) (HCC) 10/07/2018   CAD in native artery 10/07/2018   Chronic pain 10/07/2018   Acute back pain 10/07/2018   Acute hypoxemic respiratory failure (HCC) 09/19/2018   Acute on chronic combined systolic and diastolic CHF  (congestive heart failure) (HCC) 09/19/2018   COPD with acute exacerbation (HCC) 09/19/2018   Dyspnea 11/30/2017   Non-ST elevation (NSTEMI) myocardial infarction (HCC)    HTN (hypertension) 11/23/2017   Left bundle branch block 11/23/2017   Acute on chronic diastolic CHF (congestive heart failure) (HCC) 11/23/2017   CAD (coronary artery disease) 11/23/2017   Primary malignant neoplasm of breast with metastasis (HCC) 11/23/2017   Acute respiratory failure with hypoxia (HCC) 11/23/2017   Atypical nevus 07/29/2016   Age-related nuclear cataract, bilateral 02/12/2016   Anxiety and depression 01/24/2016   Arthropathy, lower leg 09/14/2013   Tibialis tendinitis 02/22/2013   Plantar fasciitis 01/17/2013   PCP:  Rosamond Leta NOVAK, MD Pharmacy:   [facility] - [place], [place] - [address] [place] [address] Phone: [phone] Fax: [phone]  [facility] - [place], [place] - [address] [place] [address] Phone: [phone] Fax: [phone]     Social Drivers of Health (SDOH) Social History: SDOH Screenings   Food Insecurity: No Food Insecurity (08/12/2024)  Housing: Low Risk  (08/12/2024)  Transportation Needs: No Transportation Needs (08/12/2024)  Utilities: Not At Risk (08/12/2024)  Depression (PHQ2-9): Low Risk  (  08/03/2024)  Financial Resource Strain: High Risk (01/04/2024)   [facility]  Physical Activity: Inactive (08/17/2023)   [facility]  Social Connections: Unknown (08/12/2024)  Stress: No Stress Concern Present (08/17/2023)   [facility] from [facility]  Tobacco Use: Medium Risk (08/12/2024)  Health Literacy: Inadequate Health Literacy (08/17/2023)   Received from [facility]   SDOH Interventions:     Readmission Risk Interventions    06/08/2024   11:19 AM 05/20/2024    9:15 AM 05/19/2024     2:01 PM  Readmission Risk Prevention Plan  Transportation Screening Complete Complete Complete  Medication Review [employer]) Complete Complete Complete  HRI or Home Care Consult Complete Complete Complete  SW Recovery Care/Counseling Consult Complete Complete Complete  Palliative Care Screening Not Applicable Not Applicable Not Applicable  Skilled Nursing Facility Not Applicable Not Applicable Not Applicable

## 2024-08-12 NOTE — Consult Note (Signed)
 Consultation Note Date: 08/12/2024   Patient Name: Stacey  DIANEY [name]  DOB: 1946/03/30  MRN: [id number]  Age / Sex: 78 y.o., female  PCP: Rosamond Leta KATHEE, MD Referring Physician: Briana Elgin LABOR, MD  Reason for Consultation: Establishing goals of care and Pain control  HPI/Patient Profile: 78 y.o. female  with past medical history of metastatic breast cancer to bone, newly diagnosed pancreatic adenocarcinoma, suspected RCC,  biliary stricture with prior stent, PE, atrial fibrillation, CAD with prior stenting, hypertension, hyperlipidemia CKD stage 2, COPD, anxiety, and depression, chronic pain with intrathecal morphine  / bupivicaine pump admitted on 08/11/2024 with abdominal pain and nausea.   Admitted for acute on chronic intractable malignancy related pain.  She recently enrolled with hospice and had an intrathecal pain pump.  Pain pump managed by [facility] and once enrolling with hospice their plan is to discontinue the intrathecal pain pump and transition to alternative therapy. Workup also revealed findings concerning for UTI. On IV abx for this.   Office visit from 08/11/2024 [facility]. Patient's son reported they decreased her rate significantly but in review of notes it appears they discontinued the pain medication and transitioned to saline only. Hospice plan per notes were to transition to MS Contin  30 BID with oral short acting morphine  q 3-4 hrs PRN. Son received several different types of morphine  but had not yet administered them as reports he was advised she would be switched to Fentanyl  pain patch. He has not been provided the Fentanyl  pain patch as of yet. No hospice notes available for review.   Prior opioid regimen per [facility] pain clinic: Dilaudid  2 mg q 6 hrs PRN, morphine  (PF) 15 mg/mL, BUPivacaine  (PF) 15 mg/mL 40 mL rate listed as directed, unable to locate exact rate in most recent or prior notes. She is also on adjuvant therapy with Tylenol ,  Voltaren  gel, Cymbalta , and Lyrica . PDMP reviewed.  I see where she was prescribed morphine  extended release tablets and morphine  concentrate.  Last hydromorphone  prescription was for 2 mg tablets on 07/29/2024 it was only a 13-day supply. Per son was using PRN Dilaudid  tabs prior to hospital admission but ran out (? Date).   PMT has been consulted to assist with symptom management.  Today, labs independently reviewed.  Renal function stable with creatinine 0.5, BUN 12, eGFR greater than 60.  Mild hypocalcemia noted in the setting of hypoalbuminemia and mild hyperglycemia.  Calcium  corrected is 10.  Albumin  level low at 2.2 otherwise CMP stable.  CBC reveals normal white count and mild anemia with a hemoglobin of 10.8. CT abdomen and pelvis findings reviewed.  Findings concerning for cystitis present which is supported with presence of large leukocytes on UA.  No other significant findings noted on CT imaging that have not been previously noted.    Independent history obtained from son and nursing staff as patient reports she is in significant pain and is having difficulty providing history. Per nursing staff pt has been sleeping off and on today and has not required much pain medication. Patient is apparently bedbound at home and uses [facility] for transfers.  24 hr med review completed. Has had a total of 2.5 mg IV Dilaudid  on 24 hr look back.   On my assessment, patient is not able to contribute much to history due to being in severe pain. She reports pain 10 out of 10 to lower abdomen. She cannot remember the last time she had pain medication.   Hydromorphone  1 mg IV ordered  and given. Tolerated well and with relief in pain on reassessment.   Clinical Assessment and Goals of Care:  I have reviewed medical records including EPIC notes, labs and imaging (independently reviewed), outside specialist notes, assessed the patient and then met with patient at bedside. Attempted to complete GOC discussion  but unable to do so due to pain severity. Goals seem clear.  Per previous outpatient specialty notes, patient no longer desires to undergo treatment for cancer and has transition to hospice.     SUMMARY OF RECOMMENDATIONS    Current order was for Dilaudid  0.75 mg IV, will DC Unknown amount of time without exposure to opioids prior to admission Will order Dilaudid  1 mg IV every 2 hours as needed  Consider adding MS Contin  30 mg every 12 hours scheduled once certain short acting meds are tolerated  Transition to oral agents once pain is controlled Discharge back home with hospice support once pain is controlled   Code Status/Advance Care Planning: DNR   Symptom Management:   Ordered Hydromorphone  1 mg IV every 2 hours as needed Add MS Contin  once certain short acting as tolerated Continue Zofran  PRN Continue Xanax  0.5 mg BID PRN Continue Miralax  PRN   Prognosis:  < 6 months  Discharge Planning: Home with Hospice      Primary Diagnoses: Present on Admission:  Urinary tract infection    Physical Exam Constitutional:      Comments: Patient initially moaning and crying out with severe pain.  Restless and unable to sit still.  IV Dilaudid  1 mg administered.  Appears effective.  She is sleeping after but is arousable to verbal stimuli and able to report a decreased pain level.  She appears more comfortable.  Appears chronically ill.  Pulmonary:     Effort: Pulmonary effort is normal. No respiratory distress.  Skin:    General: Skin is warm and dry.     Vital Signs: BP (!) 154/71 (BP Location: Right Wrist)   Pulse 95   Temp 98.5 F (36.9 C) (Oral)   Resp 17   Ht 5' (1.524 m)   Wt 74.4 kg   SpO2 96%   BMI 32.03 kg/m  Pain Scale: 0-10   Pain Score: 8    SpO2: SpO2: 96 % O2 Device:SpO2: 96 % O2 Flow Rate: .    Palliative Assessment/Data: 30%     Billing based on MDM: High  Problems Addressed: One or more chronic illnesses with severe exacerbation,  progression, or side effects of treatment.  Amount and/or Complexity of Data: Category 1:Assessment requiring an independent historian(s) and Category 2:Independent interpretation of a test performed by another physician/other qualified health care professional (not separately reported)  Risks: Parenteral controlled substances   Laymon CHRISTELLA Pinal, NP  Palliative Medicine Team Team phone # [phone]  Thank you for allowing the Palliative Medicine Team to assist in the care of this patient. Please utilize secure chat with additional questions, if there is no response within 30 minutes please call the above phone number.  Palliative Medicine Team providers are available by phone from 7am to 7pm daily and can be reached through the team cell phone.  Should this patient require assistance outside of these hours, please call the patient's attending physician.

## 2024-08-12 NOTE — H&P (Signed)
 History and Physical    Stacey  EUDORA [name] FMW:[id number] DOB: 10-22-1946 DOA: 08/11/2024  PCP: Rosamond Leta KATHEE, MD   Patient coming from: Home   Chief Complaint:  Chief Complaint  Patient presents with   Abdominal Pain    HPI:  Stacey  B [name] is a 78 y.o. female with hx of metastatic breast cancer to bone, newly diagnosed pancreatic adenocarcinoma, suspected RCC, who had recently decided on hospice care per report with outpatient oncology note; additional history of biliary stricture with prior stent, PE, atrial fibrillation, CAD with prior stenting, hypertension, hyperlipidemia CKD stage 2, COPD, anxiety, and depression, chronic pain with intrathecal morphine  / bupivicaine pump, who presented with abdominal pain.  Reports that she has had worsening of her chronic upper abdominal pain, nausea.  She has reduced her p.o. intake due to her worsening symptoms.  Has recently followed up with her pain management physician and her intrathecal pump has been decreased in dosage with reported plan to switch to a fentanyl  patch, however she was also just prescribed MS Contin  30 mg every 12 hours but does not think that she started taking this yet.  Hospice nurse saw her this evening and patient requested to come to the hospital.  She denies any urinary changes, fevers, chills.     Review of Systems:  ROS complete and negative except as marked above   Allergies  Allergen Reactions   Albuterol  Other (See Comments)    Heart racing    Brilinta  [Ticagrelor ] Diarrhea and Nausea Only    Nausea and severe diarrhea- general weakness. Patient says does not want to take again   Budesonide  Palpitations    Prior to Admission medications   Medication Sig Start Date End Date Taking? Authorizing Provider  ALPRAZolam  (XANAX ) 0.5 MG tablet Take 1 tablet (0.5 mg total) by mouth 2 (two) times daily as needed for anxiety or sleep. 05/20/24   Pearlean Manus, MD  apixaban  (ELIQUIS ) 2.5 MG TABS tablet Take 2.5 mg  by mouth daily.    [provider]  atorvastatin  (LIPITOR ) 80 MG tablet Take 80 mg by mouth daily. 06/17/24   [provider]  b complex vitamins capsule Take 1 capsule by mouth daily.    [provider]  carboxymethylcellulose (REFRESH PLUS) 0.5 % SOLN Place 1 drop into both eyes 3 (three) times daily as needed (Dry Eyes).    [provider]  clopidogrel  (PLAVIX ) 75 MG tablet Take 1 tablet (75 mg total) by mouth daily. 06/17/24   Alvan Dorn FALCON, MD  DULoxetine  (CYMBALTA ) 30 MG capsule Take 30 mg by mouth 2 (two) times daily.    [provider]  ferrous sulfate  325 (65 FE) MG EC tablet Take 1 tablet (325 mg total) by mouth daily with breakfast. 06/09/24   Maree, Pratik D, DO  HYDROmorphone  (DILAUDID ) 2 MG tablet Take 2 mg by mouth every 6 (six) hours as needed for severe pain (pain score 7-10). Via pump    [provider]  methocarbamol  (ROBAXIN ) 500 MG tablet Take 500 mg by mouth every 6 (six) hours as needed for muscle spasms.    [provider]  metoprolol  succinate (TOPROL -XL) 25 MG 24 hr tablet Take 3 tablets (75 mg total) by mouth daily. 06/24/24   Alvan Dorn FALCON, MD  nitroGLYCERIN  (NITROSTAT ) 0.4 MG SL tablet Place 0.4 mg under the tongue every 5 (five) minutes as needed for chest pain.    [provider]  ondansetron  (ZOFRAN ) 4 MG tablet Take 4 mg  by mouth every 8 (eight) hours as needed for nausea or vomiting.    [provider]  pantoprazole  (PROTONIX ) 40 MG tablet Take 1 tablet (40 mg total) by mouth 2 (two) times daily. 05/20/24 05/20/25  Pearlean Manus, MD  polyethylene glycol (MIRALAX  / GLYCOLAX ) 17 g packet Take 17 g by mouth 2 (two) times daily. 05/20/24   Pearlean Manus, MD  senna-docusate (SENOKOT-S) 8.6-50 MG tablet Take 2 tablets by mouth at bedtime. 06/09/24 06/09/25  Maree, Pratik D, DO  sucralfate  (CARAFATE ) 1 GM/10ML suspension Take 10 mLs (1 g total) by mouth 4 (four) times daily -  before meals  and at bedtime. 05/20/24   Pearlean Manus, MD    Past Medical History:  Diagnosis Date   CAD in native artery    a. DES to ramus 2005 with late stent thrombosis 2006 tx with PTCA. 12/18 PCI/DES x1 to mRCA, EF 50-55%   CHF (congestive heart failure) (HCC)    Chronic pain    COPD (chronic obstructive pulmonary disease) (HCC)    Hyperlipidemia    Hypertension    Left bundle branch block    Lymphedema    Metastatic breast cancer    a. to bone.   MI (myocardial infarction) (HCC)    Mild aortic stenosis 10/2017   Morbid obesity (HCC)    PAF (paroxysmal atrial fibrillation) (HCC)    PSVT (paroxysmal supraventricular tachycardia) (HCC)    a. per [facility] notes, seen on event monitor in 2014.   Pulmonary nodules     Past Surgical History:  Procedure Laterality Date   BILIARY STENT PLACEMENT  03/09/2024   Procedure: INSERTION, STENT, BILE DUCT;  Surgeon: Jinny Carmine, MD;  Location: [facility] ENDOSCOPY;  Service: Endoscopy;;   CHOLECYSTECTOMY N/A 03/29/2021   Procedure: LAPAROSCOPIC CHOLECYSTECTOMY;  Surgeon: Kallie Manuelita BROCKS, MD;  Location: [facility];  Service: General;  Laterality: N/A;   CORONARY STENT INTERVENTION N/A 11/26/2017   Procedure: CORONARY STENT INTERVENTION;  Surgeon: Swaziland, Peter M, MD;  Location: [facility] INVASIVE CV LAB;  Service: Cardiovascular;  Laterality: N/A;   CORONARY STENT PLACEMENT     ERCP N/A 03/28/2021   Procedure: ENDOSCOPIC RETROGRADE CHOLANGIOPANCREATOGRAPHY (ERCP);  Surgeon: Golda Claudis PENNER, MD;  Location: [facility];  Service: Endoscopy;  Laterality: N/A;   ERCP N/A 03/09/2024   Procedure: ERCP, WITH INTERVENTION IF INDICATED;  Surgeon: Jinny Carmine, MD;  Location: [facility] ENDOSCOPY;  Service: Endoscopy;  Laterality: N/A;   ERCP N/A 04/15/2024   Procedure: ERCP, WITH INTERVENTION IF INDICATED;  Surgeon: Jinny Carmine, MD;  Location: [facility] ENDOSCOPY;  Service: Endoscopy;  Laterality: N/A;   ESOPHAGOGASTRODUODENOSCOPY  03/09/2024   Procedure: EGD (ESOPHAGOGASTRODUODENOSCOPY);   Surgeon: Jinny Carmine, MD;  Location: [facility] ENDOSCOPY;  Service: Endoscopy;;   ESOPHAGOGASTRODUODENOSCOPY N/A 06/09/2024   Procedure: EGD (ESOPHAGOGASTRODUODENOSCOPY);  Surgeon: Cinderella Deatrice FALCON, MD;  [facility];  Service: Endoscopy;  Laterality: N/A;   ESOPHAGOGASTRODUODENOSCOPY N/A 06/30/2024   Procedure: EGD (ESOPHAGOGASTRODUODENOSCOPY);  Surgeon: Wilhelmenia Aloha Raddle., MD;  Location: [facility];  Service: Gastroenterology;  Laterality: N/A;   ESOPHAGOGASTRODUODENOSCOPY N/A 07/14/2024   Procedure: EGD (ESOPHAGOGASTRODUODENOSCOPY);  Surgeon: Wilhelmenia Aloha Raddle., MD;  Location: [facility];  Service: Gastroenterology;  Laterality: N/A;   EUS N/A 06/30/2024   Procedure: ULTRASOUND, UPPER GI TRACT, ENDOSCOPIC;  Surgeon: Wilhelmenia Aloha Raddle., MD;  Location: [facility] ENDOSCOPY;  Service: Gastroenterology;  Laterality: N/A;   EUS N/A 07/14/2024   Procedure: ULTRASOUND, UPPER GI TRACT, ENDOSCOPIC;  Surgeon: Mansouraty, Aloha Raddle., MD;  Location: [facility]OSCOPY;  Service:  Gastroenterology;  Laterality: N/A;   FINE NEEDLE ASPIRATION BIOPSY  07/14/2024   Procedure: FINE NEEDLE ASPIRATION BIOPSY;  Surgeon: Wilhelmenia Aloha Raddle., MD;  Location: [facility];  Service: Gastroenterology;;   intrathecal pain pump     LEFT HEART CATH AND CORONARY ANGIOGRAPHY N/A 11/26/2017   Procedure: LEFT HEART CATH AND CORONARY ANGIOGRAPHY;  Surgeon: Swaziland, Peter M, MD;  Location: [facility] INVASIVE CV LAB;  Service: Cardiovascular;  Laterality: N/A;   REMOVAL OF STONES  03/28/2021   Procedure: REMOVAL OF STONES;  Surgeon: Golda Claudis PENNER, MD;  Location: [facility];  Service: Endoscopy;;   SPHINCTEROTOMY  03/28/2021   Procedure: SPHINCTEROTOMY;  Surgeon: Golda Claudis PENNER, MD;  Location: [facility];  Service: Endoscopy;;   UMBILICAL HERNIA REPAIR N/A 04/03/2021   Procedure: ADULT PRIMARY UMBILICAL HERNIA REPAIR;  Surgeon: Kallie Manuelita BROCKS, MD;  Location: [facility];  Service: General;  Laterality: N/A;   VASCULAR SURGERY        reports that she quit smoking about 43 years ago. Her smoking use included cigarettes. She started smoking about 61 years ago. She has a 36 pack-year smoking history. She has never used smokeless tobacco. She reports that she does not drink alcohol  and does not use drugs.  Family History  Problem Relation Age of Onset   CAD Father 34   Heart attack Father    COPD Sister    CAD Paternal Grandmother    Sudden Cardiac Death Neg Hx    Colon polyps Neg Hx    Colon cancer Neg Hx      Physical Exam: Vitals:   08/11/24 2130 08/11/24 2308 08/11/24 2310 08/12/24 0000  BP: (!) 159/75 (!) 164/83  130/67  Pulse: 84  (!) 109 88  Resp:    18  Temp:      TempSrc:      SpO2: 98%  96% 96%  Weight:      Height:        Gen: Awake, alert, chronically ill-appearing CV: Regular, normal S1, S2, 2/6 SEM Resp: Normal WOB, CTAB  Abd: Flat, hypoactive, moderate to severe tenderness in the upper abdomen, no rebound, guarding, rigidity MSK: Symmetric, asymmetric 1-2+ pitting edema in the right lower extremity Skin: No rashes or lesions to exposed skin  Neuro: Alert and interactive  Psych: euthymic, appropriate    Data review:   Labs reviewed, notable for:   Personally reviewed Chemistries unremarkable, lipase within normal limit LFT pending WBC 5 Hemoglobin 10.8 UA appears grossly consistent with infection  Micro:  Results for orders placed or performed during the hospital encounter of 04/14/24  Culture, blood (Routine X 2) w Reflex to ID Panel     Status: None   Collection Time: 04/14/24  7:12 PM   Specimen: BLOOD  Result Value Ref Range Status   Specimen Description BLOOD BLOOD LEFT HAND  Final   Special Requests   Final    BOTTLES DRAWN AEROBIC AND ANAEROBIC Blood Culture adequate volume   Culture   Final    NO GROWTH 5 DAYS Performed at [facility], [address] [place], [place] [address]    Report Status 04/19/2024 FINAL  Final  Culture, blood (Routine X 2) w  Reflex to ID Panel     Status: None   Collection Time: 04/14/24  7:13 PM   Specimen: BLOOD  Result Value Ref Range Status   Specimen Description BLOOD BLOOD LEFT ARM  Final   Special Requests   Final    BOTTLES  DRAWN AEROBIC AND ANAEROBIC Blood Culture adequate volume   Culture   Final    NO GROWTH 5 DAYS Performed at [facility], [address] [place] [place], [place] [address]    Report Status 04/19/2024 FINAL  Final  MRSA Next Gen by PCR, Nasal     Status: Abnormal   Collection Time: 04/15/24  5:40 AM   Specimen: Nasal Mucosa; Nasal Swab  Result Value Ref Range Status   MRSA by PCR Next Gen DETECTED (A) NOT DETECTED Final    Comment: RESULT CALLED TO, READ BACK BY AND VERIFIED WITH: Diallo, Boubacar P, RN 04/15/24 0719 MW (NOTE) The GeneXpert MRSA Assay (FDA approved for NASAL specimens only), is one component of a comprehensive MRSA colonization surveillance program. It is not intended to diagnose MRSA infection nor to guide or monitor treatment for MRSA infections. Test performance is not FDA approved in patients less than 74 years old. Performed at [facility], [address], [place], [place] [address]     Imaging reviewed:  CT ABDOMEN PELVIS W CONTRAST Result Date: 08/11/2024 CLINICAL DATA:  Acute abdominal pain. EXAM: CT ABDOMEN AND PELVIS WITH CONTRAST TECHNIQUE: Multidetector CT imaging of the abdomen and pelvis was performed using the standard protocol following bolus administration of intravenous contrast. RADIATION DOSE REDUCTION: This exam was performed according to the departmental dose-optimization program which includes automated exposure control, adjustment of the mA and/or kV according to patient size and/or use of iterative reconstruction technique. CONTRAST:  OMNIPAQUE  IOHEXOL  300 MG/ML  SOLN COMPARISON:  CT abdomen and pelvis 06/06/2024. MRI abdomen 06/08/2024. FINDINGS: Lower chest: There are trace bilateral pleural effusions.  Hepatobiliary: No focal liver lesions are identified. Gallbladder is surgically absent. Pneumobilia appears unchanged from prior. Common bile duct stent in place, unchanged. Pancreas: Diffuse pancreatic ductal dilatation appears unchanged. Vague focal hypodense area in the head of the pancreas measures up to 12 mm image 2/28 which appears unchanged from prior MRI. Spleen: Normal in size without focal abnormality. Adrenals/Urinary Tract: There is mild diffuse bladder wall thickening versus normal under distension. There is no hydronephrosis or perinephric stranding. Heterogeneous left renal mass measures 3 cm in diameter, unchanged from prior MRI. Additional small renal cysts are again seen. Adrenal glands are within normal limits. Stomach/Bowel: No evidence of bowel wall thickening, distention, or inflammatory changes. There is sigmoid colon diverticulosis. Appendix is not visualized. There is a large air-fluid level in the stomach. Vascular/Lymphatic: Aortic atherosclerosis. No enlarged abdominal or pelvic lymph nodes. Reproductive: Status post hysterectomy. No adnexal masses. Other: No abdominal wall hernia or abnormality. No abdominopelvic ascites. Generator is seen in the anterior right abdominal wall. Musculoskeletal: Chronic appearing fragmentation of L1 with retropulsion of fracture fragments appears unchanged. Sclerotic lesion of L2 is also unchanged. IMPRESSION: 1. Mild diffuse bladder wall thickening versus normal under distension. Correlate clinically for cystitis. 2. Stable common bile duct stent with pneumobilia. 3. Stable pancreatic ductal dilatation. 4. Stable left renal mass. 5. Trace bilateral pleural effusions. 6. Aortic atherosclerosis. Aortic Atherosclerosis (ICD10-I70.0). Electronically Signed   By: Greig Pique M.D.   On: 08/11/2024 20:55    ED Course:  Treated with 1 L IV fluid, ceftriaxone  2 g, Dilaudid , Zofran    Assessment/Plan:  78 y.o. female with hx metastatic breast cancer to  bone, newly diagnosed pancreatic adenocarcinoma, suspected RCC, who had recently decided on hospice care per report with outpatient oncology note; additional history of biliary stricture with prior stent, PE, atrial fibrillation, CAD with prior stenting, hypertension, hyperlipidemia,  CKD stage 2, COPD, anxiety, and depression, chronic pain with intrathecal morphine  / bupivicaine pump, who presented with worsening of abdominal pain, likely malignancy related pain.    Malignancy related pain Nausea P/w worsening of chronic cancer related pain, nausea. Pain regimen includes intrathecal pain pump, recent Rx for MS Contin  30 mg every 12 hours (although does not think has started this yet). Recent Rx for Dilaudid  2 mg q6 hr although out of this medication.  -Palliative care consultation when available for malignancy related pain management -Continue on home intrathecal pump -Start home MS Contin  30 mg every 12 hours -Add Dilaudid  0.75 mg IV every 4 hours as needed for breakthrough pain uncontrolled with p.o. medications.;  -For nausea Zofran  as needed inpatient, should prescribe ODT medication with Zofran , could consider olanzapine.  Urinary tract infection, uncomplicated  UA grossly consistent with infection.  CT abdomen pelvis demonstrating bladder wall thickening consistent with cystitis, without other complicating features.  Remote micro history with pan sensitive E. coli - Continue ceftriaxone  1 g IV every 24 hours for now - Follow-up urine culture  Chronic medical problems:-> She expresses desire to reduce her pill burden but balanced approach and wants to continue some medications including anticoagulation, antiplatelets, antihypertensives Metastatic breast cancer to bone: Per recent oncology note saw Dr. Davonna on 9/3 and planned for transition to hospice care. Has stopped treatment for breast CA, and not pursing treatment for pancreatic CA, RCC.  Newly diagnosed pancreatic adenocarcinoma: See  above  RCC: see above  Hx of biliary stricture, stent: Check LFT  PE: Still on Eliquis  although she is having trouble affording this; she was asking about switching to Coumadin .  I discussed if she desires continuation of anticoagulation Lovenox  may be both a cheaper option but does not require regular lab monitoring compared to Coumadin  Afib: Continue Eliquis  for now.  Continue metoprolol  CAD with stent: Continue Plavix  HTN: Amlodipine , metoprolol .  Stop losartan  to reduce pill burden, discussed with patient HLD: Stop atorvastatin  to reduce pill burden, discussed with patient CKD ? 2: Baseline creatinine near 0.5 COPD: Nebs prn Mood d/o: Continue home Xanax  twice daily.  Body mass index is 37.29 kg/m.    DVT prophylaxis:  enoxaparin   Code Status:  DNR/DNI(Do NOT Intubate); confirmed with patient  Diet:  Diet Orders (From admission, onward)    None      Family Communication:  None   Consults:  Palliative   Admission status:   Observation, Med-Surg  Severity of Illness: The appropriate patient status for this patient is OBSERVATION. Observation status is judged to be reasonable and necessary in order to provide the required intensity of service to ensure the patient's safety. The patient's presenting symptoms, physical exam findings, and initial radiographic and laboratory data in the context of their medical condition is felt to place them at decreased risk for further clinical deterioration. Furthermore, it is anticipated that the patient will be medically stable for discharge from the hospital within 2 midnights of admission.    Dorn Dawson, MD [facility]  How to contact the [facility] Attending or Consulting provider 7A - 7P or covering provider during after hours 7P -7A, for this patient.  Check the care team in [facility] and look for a) attending/consulting [facility] provider listed and b) the [facility] team listed Log into [email or web address] and use [place]'s universal password to  access. If you do not have the password, please contact the hospital operator. Locate the [facility] provider you are looking for under [facility] and  page to a number that you can be directly reached. If you still have difficulty reaching the provider, please page the [facility] (Director on Call) for the Hospitalists listed on amion for assistance.  08/12/2024, 12:20 AM

## 2024-08-12 NOTE — Progress Notes (Signed)
 PROGRESS NOTE    Stacey  B [name]  FMW:[id number] DOB: [birth year] DOA: 08/11/2024 PCP: Rosamond Leta NOVAK, MD   Brief Narrative: Stacey [name] is a 78 y.o. female with a history of stage IV breast cancer, pancreatic adenocarcinoma, possible renal cell carcinoma, PE, CAD s/p stent, atrial fibrillation, hypertension, hyperlipidemia, CKD, COPD, anxiety, depression, chronic pain.  Patient presented secondary to abdominal pain secondary to known metastatic breast cancer, in addition to known pancreatic cancer.   Assessment/Plan:  Acute on chronic pain Malignancy related pain Patient recently enrolled with Hospice and recently started on adjusted pain regimen. Patient also with a home intrathecal ump. Palliative care consulted. -Continue intrathecal pump -Continue MS Contin  and Dilaudid ; will need to adjust medications for mental status  Nausea -Continue Zofran  as needed  UTI Urinalysis suggests possible UTI. Urine culture obtained and Ceftriaxone  started empirically. -Continue Ceftriaxone  -Follow-up urine culture  Stage IV breast cancer Noted. Patient enrolled with hospice.  Pancreatic adenocarcinoma Noted.  Renal cell carcinoma Noted.  History of biliary stricture S/p stent placement. LFTs normal.  History of pulmonary embolism Patient is on Eliquis  as an outpatient. Note concerns for affordability of Eliquis . -Continue Eliquis   Atrial fibrillation Unclear if chronic versus paroxysmal atrial fibrillation. Stable. Currently on Eliquis  and metoprolol . -Continue metoprolol   CAD S/p stent placement -Continue Plavix   Primary hypertension Patient is on amlodipine , metoprolol  and losartan  as an outpatient. Losartan  was held on admission to reduce pill burden. Will discontinue on discharge. -Continue amlodipine  and metoprolol   Hyperlipidemia Patient is on Lipitor  as an outpatient, which was held on admission to reduce pill burden. Will discontinue on discharge.  CKD  stage II Stable.  COPD -Continue albuterol  as needed  Anxiety -Continue Xanax   Obesity Estimated body mass index is 32.03 kg/m as calculated from the following:   Height as of this encounter: 5' (1.524 m).   Weight as of this encounter: 74.4 kg.   DVT prophylaxis: Eliquis  Code Status:   Code Status: Limited: Do not attempt resuscitation (DNR) -DNR-LIMITED -Do Not Intubate/DNI  Family Communication: None at bedside Disposition Plan: Discharge home pending improved pain management and transition to outpatient antibiotic regimen as needed   Consultants:  Palliative care medicine  Procedures:  None  Antimicrobials: Ceftriaxone     Subjective: Pain is well controlled this morning. States she slept well last night.  Objective: BP (!) 143/105   Pulse 98   Temp 97.9 F (36.6 C) (Oral)   Resp 17   Ht 5' (1.524 m)   Wt 86.6 kg   SpO2 96%   BMI 37.29 kg/m   Examination:  General exam: Appears calm and comfortable HEENT: Pupils mildly dilated (examined in the dark) but reactive to light. Respiratory system: Clear to auscultation. Respiratory effort normal. Cardiovascular system: S1 & S2 heard, RRR. No murmurs, rubs, gallops or clicks. Gastrointestinal system: Abdomen is nondistended, soft and significantly tender in epigastric region. Normal bowel sounds heard. Central nervous system: Somnolent. No focal neurological deficits. Musculoskeletal: 2+ BLE edema. No calf tenderness   Data Reviewed: I have personally reviewed following labs and imaging studies   Last CBC Lab Results  Component Value Date   WBC 5.6 08/11/2024   HGB 10.8 (L) 08/11/2024   HCT 34.4 (L) 08/11/2024   MCV 103.0 (H) 08/11/2024   MCH 32.3 08/11/2024   RDW 14.3 08/11/2024   PLT 240 08/11/2024     Last metabolic panel Lab Results  Component Value Date   GLUCOSE 165 (H) 08/11/2024   NA 144 08/11/2024  K 4.3 08/11/2024   CL 105 08/11/2024   CO2 27 08/11/2024   BUN 12 08/11/2024    CREATININE 0.50 08/11/2024   GFRNONAA >60 08/11/2024   CALCIUM  8.6 (L) 08/11/2024   PHOS 1.9 (L) 03/15/2024   PROT 5.4 (L) 08/11/2024   ALBUMIN  2.2 (L) 08/11/2024   BILITOT 0.8 08/11/2024   ALKPHOS 123 08/11/2024   AST 40 08/11/2024   ALT 19 08/11/2024   ANIONGAP 12 08/11/2024     Creatinine Clearance: Estimated Creatinine Clearance: 57.5 mL/min (by C-G formula based on SCr of 0.5 mg/dL).  No results found for this or any previous visit (from the past 240 hours).    Radiology Studies: CT ABDOMEN PELVIS W CONTRAST Result Date: 08/11/2024 CLINICAL DATA:  Acute abdominal pain. EXAM: CT ABDOMEN AND PELVIS WITH CONTRAST TECHNIQUE: Multidetector CT imaging of the abdomen and pelvis was performed using the standard protocol following bolus administration of intravenous contrast. RADIATION DOSE REDUCTION: This exam was performed according to the departmental dose-optimization program which includes automated exposure control, adjustment of the mA and/or kV according to patient size and/or use of iterative reconstruction technique. CONTRAST:  OMNIPAQUE  IOHEXOL  300 MG/ML  SOLN COMPARISON:  CT abdomen and pelvis 06/06/2024. MRI abdomen 06/08/2024. FINDINGS: Lower chest: There are trace bilateral pleural effusions. Hepatobiliary: No focal liver lesions are identified. Gallbladder is surgically absent. Pneumobilia appears unchanged from prior. Common bile duct stent in place, unchanged. Pancreas: Diffuse pancreatic ductal dilatation appears unchanged. Vague focal hypodense area in the head of the pancreas measures up to 12 mm image 2/28 which appears unchanged from prior MRI. Spleen: Normal in size without focal abnormality. Adrenals/Urinary Tract: There is mild diffuse bladder wall thickening versus normal under distension. There is no hydronephrosis or perinephric stranding. Heterogeneous left renal mass measures 3 cm in diameter, unchanged from prior MRI. Additional small renal cysts are again seen.  Adrenal glands are within normal limits. Stomach/Bowel: No evidence of bowel wall thickening, distention, or inflammatory changes. There is sigmoid colon diverticulosis. Appendix is not visualized. There is a large air-fluid level in the stomach. Vascular/Lymphatic: Aortic atherosclerosis. No enlarged abdominal or pelvic lymph nodes. Reproductive: Status post hysterectomy. No adnexal masses. Other: No abdominal wall hernia or abnormality. No abdominopelvic ascites. Generator is seen in the anterior right abdominal wall. Musculoskeletal: Chronic appearing fragmentation of L1 with retropulsion of fracture fragments appears unchanged. Sclerotic lesion of L2 is also unchanged. IMPRESSION: 1. Mild diffuse bladder wall thickening versus normal under distension. Correlate clinically for cystitis. 2. Stable common bile duct stent with pneumobilia. 3. Stable pancreatic ductal dilatation. 4. Stable left renal mass. 5. Trace bilateral pleural effusions. 6. Aortic atherosclerosis. Aortic Atherosclerosis (ICD10-I70.0). Electronically Signed   By: Greig Pique M.D.   On: 08/11/2024 20:55      LOS: 0 days    Elgin Lam, MD [facility] 08/12/2024, 7:58 AM   If 7PM-7AM, please contact night-coverage [email or web address]

## 2024-08-13 DIAGNOSIS — G893 Neoplasm related pain (acute) (chronic): Secondary | ICD-10-CM | POA: Diagnosis not present

## 2024-08-13 DIAGNOSIS — I252 Old myocardial infarction: Secondary | ICD-10-CM | POA: Diagnosis not present

## 2024-08-13 DIAGNOSIS — U071 COVID-19: Secondary | ICD-10-CM | POA: Diagnosis present

## 2024-08-13 DIAGNOSIS — N3001 Acute cystitis with hematuria: Secondary | ICD-10-CM | POA: Diagnosis present

## 2024-08-13 DIAGNOSIS — E785 Hyperlipidemia, unspecified: Secondary | ICD-10-CM | POA: Diagnosis present

## 2024-08-13 DIAGNOSIS — C259 Malignant neoplasm of pancreas, unspecified: Secondary | ICD-10-CM | POA: Diagnosis present

## 2024-08-13 DIAGNOSIS — Z515 Encounter for palliative care: Secondary | ICD-10-CM | POA: Diagnosis not present

## 2024-08-13 DIAGNOSIS — E8809 Other disorders of plasma-protein metabolism, not elsewhere classified: Secondary | ICD-10-CM | POA: Diagnosis present

## 2024-08-13 DIAGNOSIS — J449 Chronic obstructive pulmonary disease, unspecified: Secondary | ICD-10-CM | POA: Diagnosis present

## 2024-08-13 DIAGNOSIS — Z7902 Long term (current) use of antithrombotics/antiplatelets: Secondary | ICD-10-CM | POA: Diagnosis not present

## 2024-08-13 DIAGNOSIS — Z66 Do not resuscitate: Secondary | ICD-10-CM | POA: Diagnosis present

## 2024-08-13 DIAGNOSIS — Z6832 Body mass index (BMI) 32.0-32.9, adult: Secondary | ICD-10-CM | POA: Diagnosis not present

## 2024-08-13 DIAGNOSIS — Z7189 Other specified counseling: Secondary | ICD-10-CM | POA: Diagnosis not present

## 2024-08-13 DIAGNOSIS — Z7901 Long term (current) use of anticoagulants: Secondary | ICD-10-CM | POA: Diagnosis not present

## 2024-08-13 DIAGNOSIS — N3000 Acute cystitis without hematuria: Secondary | ICD-10-CM | POA: Diagnosis not present

## 2024-08-13 DIAGNOSIS — C649 Malignant neoplasm of unspecified kidney, except renal pelvis: Secondary | ICD-10-CM | POA: Diagnosis present

## 2024-08-13 DIAGNOSIS — I130 Hypertensive heart and chronic kidney disease with heart failure and stage 1 through stage 4 chronic kidney disease, or unspecified chronic kidney disease: Secondary | ICD-10-CM | POA: Diagnosis present

## 2024-08-13 DIAGNOSIS — F32A Depression, unspecified: Secondary | ICD-10-CM | POA: Diagnosis present

## 2024-08-13 DIAGNOSIS — F39 Unspecified mood [affective] disorder: Secondary | ICD-10-CM | POA: Diagnosis present

## 2024-08-13 DIAGNOSIS — B9620 Unspecified Escherichia coli [E. coli] as the cause of diseases classified elsewhere: Secondary | ICD-10-CM | POA: Diagnosis present

## 2024-08-13 DIAGNOSIS — R52 Pain, unspecified: Secondary | ICD-10-CM | POA: Diagnosis not present

## 2024-08-13 DIAGNOSIS — C7951 Secondary malignant neoplasm of bone: Secondary | ICD-10-CM | POA: Diagnosis present

## 2024-08-13 DIAGNOSIS — Z79899 Other long term (current) drug therapy: Secondary | ICD-10-CM | POA: Diagnosis not present

## 2024-08-13 DIAGNOSIS — N390 Urinary tract infection, site not specified: Secondary | ICD-10-CM | POA: Diagnosis present

## 2024-08-13 DIAGNOSIS — C50919 Malignant neoplasm of unspecified site of unspecified female breast: Secondary | ICD-10-CM | POA: Diagnosis present

## 2024-08-13 DIAGNOSIS — I480 Paroxysmal atrial fibrillation: Secondary | ICD-10-CM | POA: Diagnosis present

## 2024-08-13 DIAGNOSIS — F419 Anxiety disorder, unspecified: Secondary | ICD-10-CM | POA: Diagnosis present

## 2024-08-13 DIAGNOSIS — I2510 Atherosclerotic heart disease of native coronary artery without angina pectoris: Secondary | ICD-10-CM | POA: Diagnosis present

## 2024-08-13 DIAGNOSIS — D649 Anemia, unspecified: Secondary | ICD-10-CM | POA: Diagnosis present

## 2024-08-13 LAB — MRSA NEXT GEN BY PCR, NASAL: MRSA by PCR Next Gen: NOT DETECTED

## 2024-08-13 MED ORDER — HYDROMORPHONE HCL 1 MG/ML IJ SOLN
1.0000 mg | INTRAMUSCULAR | Status: DC | PRN
  Administered 2024-08-13 – 2024-08-14 (×5): 1 mg via INTRAVENOUS
  Filled 2024-08-13 (×5): qty 1

## 2024-08-13 MED ORDER — MORPHINE SULFATE ER 30 MG PO TBCR
30.0000 mg | EXTENDED_RELEASE_TABLET | Freq: Two times a day (BID) | ORAL | Status: DC
  Administered 2024-08-13 – 2024-08-15 (×5): 30 mg via ORAL
  Filled 2024-08-13 (×5): qty 1

## 2024-08-13 MED ORDER — ORAL CARE MOUTH RINSE: 15.0000 mL | OROMUCOSAL | Status: DC | PRN

## 2024-08-13 MED ORDER — HYDROMORPHONE HCL 2 MG PO TABS
2.0000 mg | ORAL_TABLET | Freq: Four times a day (QID) | ORAL | Status: DC | PRN
  Administered 2024-08-13 – 2024-08-15 (×3): 2 mg via ORAL
  Filled 2024-08-13 (×3): qty 1

## 2024-08-13 NOTE — Progress Notes (Signed)
 PROGRESS NOTE    Stacey  B [name]  FMW:[id number] DOB: 18-Jul-1946 DOA: 08/11/2024 PCP: Rosamond Leta NOVAK, MD   Brief Narrative: Stacey [name] is a 78 y.o. female with a history of stage IV breast cancer, pancreatic adenocarcinoma, possible renal cell carcinoma, PE, CAD s/p stent, atrial fibrillation, hypertension, hyperlipidemia, CKD, COPD, anxiety, depression, chronic pain.  Patient presented secondary to abdominal pain secondary to known metastatic breast cancer, in addition to known pancreatic cancer.   Assessment/Plan:  Acute on chronic pain Malignancy related pain Patient recently enrolled with Hospice and recently started on adjusted pain regimen. Patient also with a home intrathecal ump. Palliative care consulted. -Continue intrathecal pump -Restart MS Contin  30 mg BID and continue Dilaudid ; will need to adjust medications for mental status  Nausea -Continue Zofran  as needed  UTI Urinalysis suggests possible UTI. Urine culture obtained and Ceftriaxone  started empirically. -Continue Ceftriaxone  -Follow-up urine culture  Stage IV breast cancer Noted. Patient enrolled with hospice.  Pancreatic adenocarcinoma Noted.  Renal cell carcinoma Noted.  History of biliary stricture S/p stent placement. LFTs normal.  History of pulmonary embolism Patient is on Eliquis  as an outpatient. Note concerns for affordability of Eliquis . -Continue Eliquis   Atrial fibrillation Unclear if chronic versus paroxysmal atrial fibrillation. Stable. Currently on Eliquis  and metoprolol . -Continue metoprolol   CAD S/p stent placement -Continue Plavix   Primary hypertension Patient is on amlodipine , metoprolol  and losartan  as an outpatient. Losartan  was held on admission to reduce pill burden. Will discontinue on discharge. -Continue amlodipine  and metoprolol   Hyperlipidemia Patient is on Lipitor  as an outpatient, which was held on admission to reduce pill burden. Will discontinue on  discharge.  CKD stage II Stable.  COPD -Continue albuterol  as needed  Anxiety -Continue Xanax   Obesity Estimated body mass index is 32.03 kg/m as calculated from the following:   Height as of this encounter: 5' (1.524 m).   Weight as of this encounter: 74.4 kg.   DVT prophylaxis: Eliquis  Code Status:   Code Status: Limited: Do not attempt resuscitation (DNR) -DNR-LIMITED -Do Not Intubate/DNI  Family Communication: None at bedside Disposition Plan: Discharge home pending improved pain management and transition to outpatient antibiotic regimen as needed   Consultants:  Palliative care medicine  Procedures:  None  Antimicrobials: Ceftriaxone     Subjective: Pain is uncontrolled. Pain medication helps, but pain is returned after it wears off.  Objective: BP 134/66 (BP Location: Left Arm)   Pulse 99   Temp 98.4 F (36.9 C) (Oral)   Resp 20   Ht 5' (1.524 m)   Wt 74.4 kg   SpO2 97%   BMI 32.03 kg/m   Examination:  General exam: Chronically ill appearing. Respiratory system: Respiratory effort normal. Central nervous system: Alert and oriented.   Data Reviewed: I have personally reviewed following labs and imaging studies   Last CBC Lab Results  Component Value Date   WBC 5.6 08/11/2024   HGB 10.8 (L) 08/11/2024   HCT 34.4 (L) 08/11/2024   MCV 103.0 (H) 08/11/2024   MCH 32.3 08/11/2024   RDW 14.3 08/11/2024   PLT 240 08/11/2024     Last metabolic panel Lab Results  Component Value Date   GLUCOSE 165 (H) 08/11/2024   NA 144 08/11/2024   K 4.3 08/11/2024   CL 105 08/11/2024   CO2 27 08/11/2024   BUN 12 08/11/2024   CREATININE 0.50 08/11/2024   GFRNONAA >60 08/11/2024   CALCIUM  8.6 (L) 08/11/2024   PHOS 1.9 (L) 03/15/2024   PROT  5.4 (L) 08/11/2024   ALBUMIN  2.2 (L) 08/11/2024   BILITOT 0.8 08/11/2024   ALKPHOS 123 08/11/2024   AST 40 08/11/2024   ALT 19 08/11/2024   ANIONGAP 12 08/11/2024     Creatinine Clearance: Estimated Creatinine  Clearance: 53.1 mL/min (by C-G formula based on SCr of 0.5 mg/dL).  No results found for this or any previous visit (from the past 240 hours).    Radiology Studies: CT ABDOMEN PELVIS W CONTRAST Result Date: 08/11/2024 CLINICAL DATA:  Acute abdominal pain. EXAM: CT ABDOMEN AND PELVIS WITH CONTRAST TECHNIQUE: Multidetector CT imaging of the abdomen and pelvis was performed using the standard protocol following bolus administration of intravenous contrast. RADIATION DOSE REDUCTION: This exam was performed according to the departmental dose-optimization program which includes automated exposure control, adjustment of the mA and/or kV according to patient size and/or use of iterative reconstruction technique. CONTRAST:  OMNIPAQUE  IOHEXOL  300 MG/ML  SOLN COMPARISON:  CT abdomen and pelvis 06/06/2024. MRI abdomen 06/08/2024. FINDINGS: Lower chest: There are trace bilateral pleural effusions. Hepatobiliary: No focal liver lesions are identified. Gallbladder is surgically absent. Pneumobilia appears unchanged from prior. Common bile duct stent in place, unchanged. Pancreas: Diffuse pancreatic ductal dilatation appears unchanged. Vague focal hypodense area in the head of the pancreas measures up to 12 mm image 2/28 which appears unchanged from prior MRI. Spleen: Normal in size without focal abnormality. Adrenals/Urinary Tract: There is mild diffuse bladder wall thickening versus normal under distension. There is no hydronephrosis or perinephric stranding. Heterogeneous left renal mass measures 3 cm in diameter, unchanged from prior MRI. Additional small renal cysts are again seen. Adrenal glands are within normal limits. Stomach/Bowel: No evidence of bowel wall thickening, distention, or inflammatory changes. There is sigmoid colon diverticulosis. Appendix is not visualized. There is a large air-fluid level in the stomach. Vascular/Lymphatic: Aortic atherosclerosis. No enlarged abdominal or pelvic lymph nodes.  Reproductive: Status post hysterectomy. No adnexal masses. Other: No abdominal wall hernia or abnormality. No abdominopelvic ascites. Generator is seen in the anterior right abdominal wall. Musculoskeletal: Chronic appearing fragmentation of L1 with retropulsion of fracture fragments appears unchanged. Sclerotic lesion of L2 is also unchanged. IMPRESSION: 1. Mild diffuse bladder wall thickening versus normal under distension. Correlate clinically for cystitis. 2. Stable common bile duct stent with pneumobilia. 3. Stable pancreatic ductal dilatation. 4. Stable left renal mass. 5. Trace bilateral pleural effusions. 6. Aortic atherosclerosis. Aortic Atherosclerosis (ICD10-I70.0). Electronically Signed   By: Greig Pique M.D.   On: 08/11/2024 20:55      LOS: 0 days    Elgin Lam, MD [facility] 08/13/2024, 7:52 AM   If 7PM-7AM, please contact night-coverage [email or web address]

## 2024-08-13 NOTE — Plan of Care (Signed)

## 2024-08-13 NOTE — ED Provider Notes (Signed)
 [facility] MEDICAL SURGICAL UNIT Provider Note   CSN: [id number] Arrival date & time: 08/11/24  1704     Patient presents with: Abdominal Pain   Stacey [name] is a 78 y.o. female.   Patient complains of abdominal pain.  She has pancreatic cancer  The history is provided by the patient and medical records.  Abdominal Pain Pain location:  Generalized Pain quality: aching   Pain radiates to:  Does not radiate Pain severity:  Mild Onset quality:  Sudden Timing:  Constant Chronicity:  Recurrent Context: not alcohol  use   Associated symptoms: no chest pain, no cough, no diarrhea, no fatigue and no hematuria        Prior to Admission medications   Medication Sig Start Date End Date Taking? Authorizing Provider  ALPRAZolam  (XANAX ) 0.5 MG tablet Take 1 tablet (0.5 mg total) by mouth 2 (two) times daily as needed for anxiety or sleep. 05/20/24  Yes Emokpae, Courage, MD  apixaban  (ELIQUIS ) 2.5 MG TABS tablet Take 2.5 mg by mouth daily.   Yes [provider]  atorvastatin  (LIPITOR ) 80 MG tablet Take 80 mg by mouth daily. 06/17/24  Yes [provider]  b complex vitamins capsule Take 1 capsule by mouth daily.   Yes [provider]  carboxymethylcellulose (REFRESH PLUS) 0.5 % SOLN Place 1 drop into both eyes 3 (three) times daily as needed (Dry Eyes).   Yes [provider]  Cholecalciferol (VITAMIN D3) 25 MCG CAPS Take 1 capsule by mouth daily.   Yes [provider]  clopidogrel  (PLAVIX ) 75 MG tablet Take 1 tablet (75 mg total) by mouth daily. 06/17/24  Yes BranchDorn FALCON, MD  DULoxetine  (CYMBALTA ) 30 MG capsule Take 30 mg by mouth 2 (two) times daily.   Yes [provider]  ferrous sulfate  325 (65 FE) MG EC tablet Take 1 tablet (325 mg total) by mouth daily with breakfast. 06/09/24  Yes Maree, Pratik D, DO  HYDROmorphone  (DILAUDID ) 2 MG tablet Take 2 mg by mouth every 6 (six) hours as needed for severe pain (pain score 7-10). Via  pump   Yes [provider]  methocarbamol  (ROBAXIN ) 500 MG tablet Take 500 mg by mouth every 6 (six) hours as needed for muscle spasms.   Yes [provider]  metoprolol  succinate (TOPROL -XL) 25 MG 24 hr tablet Take 3 tablets (75 mg total) by mouth daily. 06/24/24  Yes BranchDorn FALCON, MD  nitroGLYCERIN  (NITROSTAT ) 0.4 MG SL tablet Place 0.4 mg under the tongue every 5 (five) minutes as needed for chest pain.   Yes [provider]  ondansetron  (ZOFRAN ) 4 MG tablet Take 4 mg by mouth every 8 (eight) hours as needed for nausea or vomiting.   Yes [provider]  pantoprazole  (PROTONIX ) 40 MG tablet Take 1 tablet (40 mg total) by mouth 2 (two) times daily. 05/20/24 05/20/25 Yes Emokpae, Courage, MD  polyethylene glycol (MIRALAX  / GLYCOLAX ) 17 g packet Take 17 g by mouth 2 (two) times daily. 05/20/24  Yes Emokpae, Courage, MD  senna-docusate (SENOKOT-S) 8.6-50 MG tablet Take 2 tablets by mouth at bedtime. 06/09/24 06/09/25 Yes Maree, Pratik D, DO    Allergies: Albuterol , Brilinta  [ticagrelor ], and Budesonide     Review of Systems  Constitutional:  Negative for appetite change and fatigue.  HENT:  Negative for congestion, ear discharge and sinus pressure.   Eyes:  Negative for discharge.  Respiratory:  Negative for cough.   Cardiovascular:  Negative for chest pain.  Gastrointestinal:  Positive for abdominal pain. Negative for diarrhea.  Genitourinary:  Negative for frequency and hematuria.  Musculoskeletal:  Negative for back pain.  Skin:  Negative for rash.  Neurological:  Negative for seizures and headaches.  Psychiatric/Behavioral:  Negative for hallucinations.     Updated Vital Signs BP 134/66 (BP Location: Left Arm)   Pulse 99   Temp 98.4 F (36.9 C) (Oral)   Resp 20   Ht 5' (1.524 m)   Wt 74.4 kg   SpO2 97%   BMI 32.03 kg/m   Physical Exam Vitals and nursing note reviewed.  Constitutional:      Appearance: She is well-developed.  HENT:      Head: Normocephalic.     Nose: Nose normal.  Eyes:     General: No scleral icterus.    Conjunctiva/sclera: Conjunctivae normal.  Neck:     Thyroid : No thyromegaly.  Cardiovascular:     Rate and Rhythm: Normal rate and regular rhythm.     Heart sounds: No murmur heard.    No friction rub. No gallop.  Pulmonary:     Breath sounds: No stridor. No wheezing or rales.  Chest:     Chest wall: No tenderness.  Abdominal:     General: There is no distension.     Tenderness: There is abdominal tenderness. There is no rebound.  Musculoskeletal:        General: Normal range of motion.     Cervical back: Neck supple.  Lymphadenopathy:     Cervical: No cervical adenopathy.  Skin:    Findings: No erythema or rash.  Neurological:     Mental Status: She is alert and oriented to person, place, and time.     Motor: No abnormal muscle tone.     Coordination: Coordination normal.  Psychiatric:        Behavior: Behavior normal.     (all labs ordered are listed, but only abnormal results are displayed) Labs Reviewed  URINE CULTURE - Abnormal; Notable for the following components:      Result Value   Culture   (*)    Value: >=100,000 COLONIES/mL ESCHERICHIA COLI SUSCEPTIBILITIES TO FOLLOW Performed at [facility], [address] [place], [place] [address]    All other components within normal limits  CBC WITH DIFFERENTIAL/PLATELET - Abnormal; Notable for the following components:   RBC 3.34 (*)    Hemoglobin 10.8 (*)    HCT 34.4 (*)    MCV 103.0 (*)    All other components within normal limits  BASIC METABOLIC PANEL WITH GFR - Abnormal; Notable for the following components:   Glucose, Bld 165 (*)    Calcium  8.6 (*)    All other components within normal limits  URINALYSIS, ROUTINE W REFLEX MICROSCOPIC - Abnormal; Notable for the following components:   Color, Urine AMBER (*)    APPearance CLOUDY (*)    Protein, ur 30 (*)    Leukocytes,Ua LARGE (*)    Bacteria, UA RARE (*)     All other components within normal limits  HEPATIC FUNCTION PANEL - Abnormal; Notable for the following components:   Total Protein 5.4 (*)    Albumin  2.2 (*)    All other components within normal limits  MRSA NEXT GEN BY PCR, NASAL  LIPASE, BLOOD    EKG: None  Radiology: CT ABDOMEN PELVIS W CONTRAST Result Date: 08/11/2024 CLINICAL DATA:  Acute abdominal pain. EXAM: CT ABDOMEN AND PELVIS WITH CONTRAST TECHNIQUE: Multidetector CT imaging of the  abdomen and pelvis was performed using the standard protocol following bolus administration of intravenous contrast. RADIATION DOSE REDUCTION: This exam was performed according to the departmental dose-optimization program which includes automated exposure control, adjustment of the mA and/or kV according to patient size and/or use of iterative reconstruction technique. CONTRAST:  OMNIPAQUE  IOHEXOL  300 MG/ML  SOLN COMPARISON:  CT abdomen and pelvis 06/06/2024. MRI abdomen 06/08/2024. FINDINGS: Lower chest: There are trace bilateral pleural effusions. Hepatobiliary: No focal liver lesions are identified. Gallbladder is surgically absent. Pneumobilia appears unchanged from prior. Common bile duct stent in place, unchanged. Pancreas: Diffuse pancreatic ductal dilatation appears unchanged. Vague focal hypodense area in the head of the pancreas measures up to 12 mm image 2/28 which appears unchanged from prior MRI. Spleen: Normal in size without focal abnormality. Adrenals/Urinary Tract: There is mild diffuse bladder wall thickening versus normal under distension. There is no hydronephrosis or perinephric stranding. Heterogeneous left renal mass measures 3 cm in diameter, unchanged from prior MRI. Additional small renal cysts are again seen. Adrenal glands are within normal limits. Stomach/Bowel: No evidence of bowel wall thickening, distention, or inflammatory changes. There is sigmoid colon diverticulosis. Appendix is not visualized. There is a large  air-fluid level in the stomach. Vascular/Lymphatic: Aortic atherosclerosis. No enlarged abdominal or pelvic lymph nodes. Reproductive: Status post hysterectomy. No adnexal masses. Other: No abdominal wall hernia or abnormality. No abdominopelvic ascites. Generator is seen in the anterior right abdominal wall. Musculoskeletal: Chronic appearing fragmentation of L1 with retropulsion of fracture fragments appears unchanged. Sclerotic lesion of L2 is also unchanged. IMPRESSION: 1. Mild diffuse bladder wall thickening versus normal under distension. Correlate clinically for cystitis. 2. Stable common bile duct stent with pneumobilia. 3. Stable pancreatic ductal dilatation. 4. Stable left renal mass. 5. Trace bilateral pleural effusions. 6. Aortic atherosclerosis. Aortic Atherosclerosis (ICD10-I70.0). Electronically Signed   By: Greig Pique M.D.   On: 08/11/2024 20:55     Procedures   Medications Ordered in the ED  acetaminophen  (TYLENOL ) tablet 1,000 mg (has no administration in time range)  albuterol  (PROVENTIL ) (2.5 MG/3ML) 0.083% nebulizer solution 2.5 mg (has no administration in time range)  melatonin tablet 6 mg (has no administration in time range)  ondansetron  (ZOFRAN ) injection 4 mg (4 mg Intravenous Given 08/12/24 2135)  polyethylene glycol (MIRALAX  / GLYCOLAX ) packet 17 g (has no administration in time range)  atorvastatin  (LIPITOR ) tablet 80 mg (80 mg Oral Given 08/13/24 0943)  metoprolol  succinate (TOPROL -XL) 24 hr tablet 75 mg (75 mg Oral Given 08/13/24 0943)  ALPRAZolam  (XANAX ) tablet 0.5 mg (0.5 mg Oral Given 08/13/24 1055)  clopidogrel  (PLAVIX ) tablet 75 mg (75 mg Oral Given 08/13/24 0943)  apixaban  (ELIQUIS ) tablet 5 mg (5 mg Oral Given 08/13/24 0943)  cefTRIAXone  (ROCEPHIN ) 1 g in sodium chloride  0.9 % 100 mL IVPB (1 g Intravenous New Bag/Given 08/12/24 2143)  alum & mag hydroxide-simeth (MAALOX/MYLANTA) 200-200-20 MG/5ML suspension 30 mL (30 mLs Oral Given 08/12/24 1543)  HYDROmorphone   (DILAUDID ) injection 1 mg (1 mg Intravenous Given 08/13/24 1055)  morphine  (MS CONTIN ) 12 hr tablet 30 mg (30 mg Oral Given 08/13/24 0943)  Oral care mouth rinse (has no administration in time range)  sodium chloride  0.9 % bolus 1,000 mL (0 mLs Intravenous Stopped 08/11/24 1959)  HYDROmorphone  (DILAUDID ) injection 1 mg (1 mg Intravenous Given 08/11/24 1837)  ondansetron  (ZOFRAN ) injection 4 mg (4 mg Intravenous Given 08/11/24 1835)  HYDROmorphone  (DILAUDID ) injection 0.5 mg (0.5 mg Intravenous Given 08/11/24 2014)  iohexol  (OMNIPAQUE ) 300 MG/ML solution  100 mL (100 mLs Intravenous Contrast Given 08/11/24 2027)  cefTRIAXone  (ROCEPHIN ) 2 g in sodium chloride  0.9 % 100 mL IVPB (0 g Intravenous Stopped 08/11/24 2338)  HYDROmorphone  (DILAUDID ) injection 1 mg (1 mg Intravenous Given 08/11/24 2307)                                    Medical Decision Making Amount and/or Complexity of Data Reviewed Labs: ordered. Radiology: ordered.  Risk Prescription drug management. Decision regarding hospitalization.  Patient with UTI and pancreatic cancer she will be admitted to medicine    Final diagnoses:  Lower abdominal pain  Acute cystitis with hematuria  Malignant neoplasm of pancreas, unspecified location of malignancy [facility])    ED Discharge Orders     None          Suzette Pac, MD 08/13/24 1138

## 2024-08-14 DIAGNOSIS — N3000 Acute cystitis without hematuria: Secondary | ICD-10-CM | POA: Diagnosis not present

## 2024-08-14 LAB — HEPATIC FUNCTION PANEL
ALT: 20 U/L (ref 0–44)
AST: 36 U/L (ref 15–41)
Albumin: 2 g/dL — ABNORMAL LOW (ref 3.5–5.0)
Alkaline Phosphatase: 132 U/L — ABNORMAL HIGH (ref 38–126)
Bilirubin, Direct: 0.2 mg/dL (ref 0.0–0.2)
Indirect Bilirubin: 0.6 mg/dL (ref 0.3–0.9)
Total Bilirubin: 0.8 mg/dL (ref 0.0–1.2)
Total Protein: 5.3 g/dL — ABNORMAL LOW (ref 6.5–8.1)

## 2024-08-14 LAB — RESPIRATORY PANEL BY PCR

## 2024-08-14 LAB — URINE CULTURE: Culture: >=100000 — AB

## 2024-08-14 LAB — RESP PANEL BY RT-PCR (RSV, FLU A&B, COVID)  RVPGX2
Influenza A by PCR: NEGATIVE
Influenza B by PCR: NEGATIVE
Resp Syncytial Virus by PCR: NEGATIVE
SARS Coronavirus 2 by RT PCR: POSITIVE — AB

## 2024-08-14 LAB — LIPASE, BLOOD: Lipase: 22 U/L (ref 11–51)

## 2024-08-14 MED ORDER — HYDROCODONE BIT-HOMATROP MBR 5-1.5 MG/5ML PO SOLN
5.0000 mL | ORAL | Status: DC | PRN
  Administered 2024-08-14: 5 mL via ORAL
  Filled 2024-08-14: qty 5

## 2024-08-14 MED ORDER — SALINE SPRAY 0.65 % NA SOLN
1.0000 | NASAL | Status: DC | PRN
  Administered 2024-08-14 (×2): 1 via NASAL
  Filled 2024-08-14: qty 44

## 2024-08-14 MED ORDER — GUAIFENESIN 100 MG/5ML PO LIQD
5.0000 mL | ORAL | Status: DC | PRN
  Administered 2024-08-14 (×2): 5 mL via ORAL
  Filled 2024-08-14 (×2): qty 5

## 2024-08-14 NOTE — Progress Notes (Signed)
 PROGRESS NOTE    [name]  FMW:[id number] DOB: Jun 17, 1946 DOA: 08/11/2024 PCP: Rosamond Leta NOVAK, MD   Brief Narrative: Stacey  B [name] is a 78 y.o. female with a history of stage IV breast cancer, pancreatic adenocarcinoma, possible renal cell carcinoma, PE, CAD s/p stent, atrial fibrillation, hypertension, hyperlipidemia, CKD, COPD, anxiety, depression, chronic pain.  Patient presented secondary to abdominal pain secondary to known metastatic breast cancer, in addition to known pancreatic cancer.   Assessment/Plan:  Acute on chronic pain Malignancy related pain Patient recently enrolled with Hospice and recently started on adjusted pain regimen. Patient also with a home intrathecal ump. Palliative care consulted. -Continue intrathecal pump -Continue MS Contin  30 mg BID and continue Dilaudid ; will need to adjust medications for mental status  Nausea -Continue Zofran  as needed  Post-prandial abdominal pain Unclear if this is related to underlying cancer or new process. Symptoms started two weeks prior. No obvious etiology on CT abdomen/pelvis although there was increased air-fluid noted in the stomach; no obvious evidence of obstruction -Check hepatic function panel and lipase  UTI Urinalysis suggests possible UTI. Urine culture obtained and Ceftriaxone  started empirically. Urine culture significant for E. Coli. -Continue Ceftriaxone   Stage IV breast cancer Noted. Patient enrolled with hospice.  Pancreatic adenocarcinoma Noted.  Renal cell carcinoma Noted.  History of biliary stricture S/p stent placement. LFTs normal.  History of pulmonary embolism Patient is on Eliquis  as an outpatient. Note concerns for affordability of Eliquis . -Continue Eliquis   Atrial fibrillation Unclear if chronic versus paroxysmal atrial fibrillation. Stable. Currently on Eliquis  and metoprolol . -Continue metoprolol   CAD S/p stent placement -Continue Plavix   Primary  hypertension Patient is on amlodipine , metoprolol  and losartan  as an outpatient. Losartan  was held on admission to reduce pill burden. Will discontinue on discharge. -Continue amlodipine  and metoprolol   Hyperlipidemia Patient is on Lipitor  as an outpatient, which was held on admission to reduce pill burden. Will discontinue on discharge.  CKD stage II Stable.  COPD -Continue albuterol  as needed  Anxiety -Continue Xanax   Obesity Estimated body mass index is 32.03 kg/m as calculated from the following:   Height as of this encounter: 5' (1.524 m).   Weight as of this encounter: 74.4 kg.   DVT prophylaxis: Eliquis  Code Status:   Code Status: Limited: Do not attempt resuscitation (DNR) -DNR-LIMITED -Do Not Intubate/DNI  Family Communication: None at bedside Disposition Plan: Discharge home pending improved pain management and transition to outpatient antibiotic regimen as needed   Consultants:  Palliative care medicine  Procedures:  None  Antimicrobials: Ceftriaxone     Subjective: Baseline pain is improved, however patient reports increased pain after eating. Some nausea without emesis.  Objective: BP (!) 147/71 (BP Location: Left Arm)   Pulse 93   Temp 98.6 F (37 C)   Resp 18   Ht 5' (1.524 m)   Wt 74.4 kg   SpO2 96%   BMI 32.03 kg/m   Examination:  General exam: Appears calm and comfortable. Chronically ill appearing. Respiratory system: Clear to auscultation. Respiratory effort normal. Cardiovascular system: S1 & S2 heard, RRR. No murmurs, rubs, gallops or clicks. Gastrointestinal system: Abdomen is nondistended, soft and tender. Normal bowel sounds heard. Central nervous system: Alert and oriented. No focal neurological deficits. Psychiatry: Judgement and insight appear normal. Mood & affect appropriate.    Data Reviewed: I have personally reviewed following labs and imaging studies   Last CBC Lab Results  Component Value Date   WBC 5.6 08/11/2024    HGB 10.8 (  L) 08/11/2024   HCT 34.4 (L) 08/11/2024   MCV 103.0 (H) 08/11/2024   MCH 32.3 08/11/2024   RDW 14.3 08/11/2024   PLT 240 08/11/2024     Last metabolic panel Lab Results  Component Value Date   GLUCOSE 165 (H) 08/11/2024   NA 144 08/11/2024   K 4.3 08/11/2024   CL 105 08/11/2024   CO2 27 08/11/2024   BUN 12 08/11/2024   CREATININE 0.50 08/11/2024   GFRNONAA >60 08/11/2024   CALCIUM  8.6 (L) 08/11/2024   PHOS 1.9 (L) 03/15/2024   PROT 5.4 (L) 08/11/2024   ALBUMIN  2.2 (L) 08/11/2024   BILITOT 0.8 08/11/2024   ALKPHOS 123 08/11/2024   AST 40 08/11/2024   ALT 19 08/11/2024   ANIONGAP 12 08/11/2024     Creatinine Clearance: Estimated Creatinine Clearance: 53.1 mL/min (by C-G formula based on SCr of 0.5 mg/dL).  Recent Results (from the past 240 hours)  Urine Culture     Status: Abnormal (Preliminary result)   Collection Time: 08/11/24  9:44 PM   Specimen: Urine, Clean Catch  Result Value Ref Range Status   Specimen Description   Final    URINE, CLEAN CATCH Performed at [facility], [address] [place], [place] [address]    Special Requests   Final    NONE Performed at [facility], [address] [place], [place] [address]    Culture (A)  Final    >=100,000 COLONIES/mL ESCHERICHIA COLI SUSCEPTIBILITIES TO FOLLOW Performed at [facility], [address], [place], [place] [address]    Report Status PENDING  Incomplete  MRSA Next Gen by PCR, Nasal     Status: None   Collection Time: 08/13/24 10:43 AM   Specimen: Nasal Mucosa; Nasal Swab  Result Value Ref Range Status   MRSA by PCR Next Gen NOT DETECTED NOT DETECTED Final    Comment: (NOTE) The GeneXpert MRSA Assay (FDA approved for NASAL specimens only), is one component of a comprehensive MRSA colonization surveillance program. It is not intended to diagnose MRSA infection nor to guide or monitor treatment for MRSA infections. Test performance is not FDA approved in patients less than 67  years old. Performed at [facility], [address], [place], [place] [address]       Radiology Studies: No results found.     LOS: 1 day    Elgin Lam, MD [facility] 08/14/2024, 7:49 AM   If 7PM-7AM, please contact night-coverage [email or web address]

## 2024-08-14 NOTE — Plan of Care (Signed)
 At the beginning of the shift patient anxious and crying 2/2 pain after eating, off going RN administered breakthrough IV dilaudid  for pain with good result. Pt stated, she remains 9-10/10 pain but better than before and is no longer tearful.   Overnight, pt started c/o sneezing, congestion and cough. On call provider notified of these symptoms along with low grade temp 100.63F and tachycardia HR 100s with all other VS at baseline. PRN tylenol  given for generalized aching, and provider ordered cough medication. Per provider not further testing and deferred testing to primary team in the morning.   Pt refused q2h turn and initially agreed to keep heels off the bed on a pillow but asked for pillow to be removed after approx. 4 hrs. Pt educated on importance of these intervention to prevent pressure injuries.   Problem: Education: Goal: Knowledge of General Education information will improve Description: Including pain rating scale, medication(s)/side effects and non-pharmacologic comfort measures Outcome: Progressing   Problem: Health Behavior/Discharge Planning: Goal: Ability to manage health-related needs will improve Outcome: Progressing   Problem: Clinical Measurements: Goal: Ability to maintain clinical measurements within normal limits will improve Outcome: Progressing Goal: Diagnostic test results will improve Outcome: Progressing   Problem: Activity: Goal: Risk for activity intolerance will decrease Outcome: Progressing   Problem: Coping: Goal: Level of anxiety will decrease Outcome: Progressing   Problem: Pain Managment: Goal: General experience of comfort will improve and/or be controlled Outcome: Progressing

## 2024-08-14 NOTE — Progress Notes (Signed)
   08/14/24 1451  Provider Notification  Provider Name/Title Dr. Briana  Date Provider Notified 08/14/24  Time Provider Notified 1430  Method of Notification Page  Notification Reason Critical Result  Test performed and critical result covid postiive  Date Critical Result Received 08/14/24  Provider response See new orders

## 2024-08-14 NOTE — Plan of Care (Signed)

## 2024-08-15 DIAGNOSIS — U071 COVID-19: Secondary | ICD-10-CM

## 2024-08-15 DIAGNOSIS — N3000 Acute cystitis without hematuria: Secondary | ICD-10-CM | POA: Diagnosis not present

## 2024-08-15 DIAGNOSIS — Z7189 Other specified counseling: Secondary | ICD-10-CM

## 2024-08-15 DIAGNOSIS — Z558 Other problems related to education and literacy: Secondary | ICD-10-CM

## 2024-08-15 DIAGNOSIS — Z515 Encounter for palliative care: Secondary | ICD-10-CM | POA: Diagnosis not present

## 2024-08-15 DIAGNOSIS — R52 Pain, unspecified: Secondary | ICD-10-CM

## 2024-08-15 DIAGNOSIS — G893 Neoplasm related pain (acute) (chronic): Secondary | ICD-10-CM | POA: Diagnosis not present

## 2024-08-15 DIAGNOSIS — Z79899 Other long term (current) drug therapy: Secondary | ICD-10-CM | POA: Diagnosis not present

## 2024-08-15 MED ORDER — SODIUM CHLORIDE 0.9% FLUSH: 9.0000 mL | INTRAVENOUS | Status: DC | PRN

## 2024-08-15 MED ORDER — ONDANSETRON HCL 4 MG/2ML IJ SOLN: 4.0000 mg | Freq: Four times a day (QID) | INTRAMUSCULAR | Status: DC | PRN

## 2024-08-15 MED ORDER — HYDROMORPHONE 1 MG/ML IV SOLN: INTRAVENOUS | Status: DC

## 2024-08-15 MED ORDER — DIPHENHYDRAMINE HCL 12.5 MG/5ML PO ELIX: 12.5000 mg | ORAL_SOLUTION | Freq: Four times a day (QID) | ORAL | Status: DC | PRN

## 2024-08-15 MED ORDER — SODIUM CHLORIDE 0.9 % IV SOLN
1.0000 g | INTRAVENOUS | Status: AC
  Administered 2024-08-15: 1 g via INTRAVENOUS
  Filled 2024-08-15: qty 10

## 2024-08-15 MED ORDER — HYDROMORPHONE 1 MG/ML IV SOLN
INTRAVENOUS | Status: DC
  Administered 2024-08-15 (×2): 30 mg via INTRAVENOUS
  Administered 2024-08-15: 4.11 mg via INTRAVENOUS
  Administered 2024-08-16: 2.27 mg via INTRAVENOUS
  Administered 2024-08-16: 1.176 mg via INTRAVENOUS
  Administered 2024-08-16: 1.76 mg via INTRAVENOUS
  Administered 2024-08-16: 2.38 mg via INTRAVENOUS
  Administered 2024-08-17: 2.07 mg via INTRAVENOUS
  Administered 2024-08-17: 1.99 mg via INTRAVENOUS
  Administered 2024-08-17: 30 mg via INTRAVENOUS
  Filled 2024-08-15 (×2): qty 30

## 2024-08-15 MED ORDER — NALOXONE HCL 0.4 MG/ML IJ SOLN: 0.4000 mg | INTRAMUSCULAR | Status: DC | PRN

## 2024-08-15 MED ORDER — DIPHENHYDRAMINE HCL 50 MG/ML IJ SOLN: 12.5000 mg | Freq: Four times a day (QID) | INTRAMUSCULAR | Status: DC | PRN

## 2024-08-15 NOTE — Progress Notes (Signed)
 PROGRESS NOTE    Stacey [name]  FMW:[id number] DOB: 03-28-1946 DOA: 08/11/2024 PCP: Rosamond Leta NOVAK, MD   Brief Narrative: Stacey [name] is a 78 y.o. female with a history of stage IV breast cancer, pancreatic adenocarcinoma, possible renal cell carcinoma, PE, CAD s/p stent, atrial fibrillation, hypertension, hyperlipidemia, CKD, COPD, anxiety, depression, chronic pain.  Patient presented secondary to abdominal pain secondary to known metastatic breast cancer, in addition to known pancreatic cancer.   Assessment/Plan:  Acute on chronic pain Malignancy related pain Patient recently enrolled with Hospice and recently started on adjusted pain regimen. Patient also with a home intrathecal pump. Palliative care consulted. -Continue MS Contin  30 mg BID and continue Dilaudid ; will need to adjust medications for mental status -Follow-up palliative care recommendations for analgesic adjustments  Nausea -Continue Zofran  as needed  Post-prandial abdominal pain Likely related to underlying cancer. Symptoms started two weeks prior. No obvious etiology on CT abdomen/pelvis although there was increased air-fluid noted in the stomach; no obvious evidence of obstruction. Lipase and hepatic function panel unremarkable for etiology. -Continue pain management  UTI Urinalysis suggests possible UTI. Urine culture obtained and Ceftriaxone  started empirically. Urine culture significant for E. Coli. -Continue Ceftriaxone   Stage IV breast cancer Noted. Patient enrolled with hospice.  Pancreatic adenocarcinoma Noted.  Renal cell carcinoma Noted.  History of biliary stricture S/p stent placement. LFTs normal.  History of pulmonary embolism Patient is on Eliquis  as an outpatient. Note concerns for affordability of Eliquis . -Continue Eliquis   Atrial fibrillation Unclear if chronic versus paroxysmal atrial fibrillation. Stable. Currently on Eliquis  and metoprolol . -Continue  metoprolol   CAD S/p stent placement -Continue Plavix   COVID-19 infection Mostly asymptomatic with no hypoxia. -Supportive care -Airborne/Contact precautions  Primary hypertension Patient is on amlodipine , metoprolol  and losartan  as an outpatient. Losartan  was held on admission to reduce pill burden. Will discontinue on discharge. -Continue amlodipine  and metoprolol   Hyperlipidemia Patient is on Lipitor  as an outpatient, which was held on admission to reduce pill burden. Will discontinue on discharge.  CKD stage II Stable.  COPD -Continue albuterol  as needed  Anxiety -Continue Xanax   Obesity Estimated body mass index is 32.03 kg/m as calculated from the following:   Height as of this encounter: 5' (1.524 m).   Weight as of this encounter: 74.4 kg.   DVT prophylaxis: Eliquis  Code Status:   Code Status: Limited: Do not attempt resuscitation (DNR) -DNR-LIMITED -Do Not Intubate/DNI  Family Communication: None at bedside Disposition Plan: Discharge home pending improved pain management and transition to outpatient antibiotic regimen as needed   Consultants:  Palliative care medicine  Procedures:  None  Antimicrobials: Ceftriaxone     Subjective: Baseline pain is improved, however patient reports increased pain after eating. Some nausea without emesis.  Objective: BP (!) 152/92 (BP Location: Left Arm)   Pulse (!) 103   Temp 98.8 F (37.1 C) (Oral)   Resp 20   Ht 5' (1.524 m)   Wt 74.4 kg   SpO2 92%   BMI 32.03 kg/m   Examination:  General exam: Appears calm and comfortable Respiratory system: Clear to auscultation. Respiratory effort normal. Cardiovascular system: S1 & S2 heard, RRR. No murmurs, rubs, gallops or clicks. Gastrointestinal system: Abdomen is nondistended, soft and tender. Normal bowel sounds heard. Central nervous system: Alert and oriented. No focal neurological deficits. Psychiatry: Judgement and insight appear normal. Mood & affect  appropriate.    Data Reviewed: I have personally reviewed following labs and imaging studies   Last CBC Lab  Results  Component Value Date   WBC 5.6 08/11/2024   HGB 10.8 (L) 08/11/2024   HCT 34.4 (L) 08/11/2024   MCV 103.0 (H) 08/11/2024   MCH 32.3 08/11/2024   RDW 14.3 08/11/2024   PLT 240 08/11/2024     Last metabolic panel Lab Results  Component Value Date   GLUCOSE 165 (H) 08/11/2024   NA 144 08/11/2024   K 4.3 08/11/2024   CL 105 08/11/2024   CO2 27 08/11/2024   BUN 12 08/11/2024   CREATININE 0.50 08/11/2024   GFRNONAA >60 08/11/2024   CALCIUM  8.6 (L) 08/11/2024   PHOS 1.9 (L) 03/15/2024   PROT 5.3 (L) 08/14/2024   ALBUMIN  2.0 (L) 08/14/2024   BILITOT 0.8 08/14/2024   ALKPHOS 132 (H) 08/14/2024   AST 36 08/14/2024   ALT 20 08/14/2024   ANIONGAP 12 08/11/2024     Creatinine Clearance: Estimated Creatinine Clearance: 53.1 mL/min (by C-G formula based on SCr of 0.5 mg/dL).  Recent Results (from the past 240 hours)  Urine Culture     Status: Abnormal   Collection Time: 08/11/24  9:44 PM   Specimen: Urine, Clean Catch  Result Value Ref Range Status   Specimen Description   Final    URINE, CLEAN CATCH Performed at [facility], [address], [place], [place] [address]    Special Requests   Final    NONE Performed at [facility], [address], [place], [place] [address]    Culture >=100,000 COLONIES/mL ESCHERICHIA COLI (A)  Final   Report Status 08/14/2024 FINAL  Final   Organism ID, Bacteria ESCHERICHIA COLI (A)  Final      Susceptibility   Escherichia coli - MIC*    AMPICILLIN 4 SENSITIVE Sensitive     CEFAZOLIN  (URINE) Value in next row Sensitive      <=1 SENSITIVEThis is a modified FDA-approved test that has been validated and its performance characteristics determined by the reporting laboratory.  This laboratory is certified under the Clinical Laboratory Improvement Amendments CLIA as qualified to perform high complexity clinical laboratory  testing.    CEFEPIME Value in next row Sensitive      <=1 SENSITIVEThis is a modified FDA-approved test that has been validated and its performance characteristics determined by the reporting laboratory.  This laboratory is certified under the Clinical Laboratory Improvement Amendments CLIA as qualified to perform high complexity clinical laboratory testing.    ERTAPENEM Value in next row Sensitive      <=1 SENSITIVEThis is a modified FDA-approved test that has been validated and its performance characteristics determined by the reporting laboratory.  This laboratory is certified under the Clinical Laboratory Improvement Amendments CLIA as qualified to perform high complexity clinical laboratory testing.    CEFTRIAXONE  Value in next row Sensitive      <=1 SENSITIVEThis is a modified FDA-approved test that has been validated and its performance characteristics determined by the reporting laboratory.  This laboratory is certified under the Clinical Laboratory Improvement Amendments CLIA as qualified to perform high complexity clinical laboratory testing.    CIPROFLOXACIN  Value in next row Intermediate      <=1 SENSITIVEThis is a modified FDA-approved test that has been validated and its performance characteristics determined by the reporting laboratory.  This laboratory is certified under the Clinical Laboratory Improvement Amendments CLIA as qualified to perform high complexity clinical laboratory testing.    GENTAMICIN Value in next row Sensitive      <=1 SENSITIVEThis is a modified FDA-approved test that has  been validated and its performance characteristics determined by the reporting laboratory.  This laboratory is certified under the Clinical Laboratory Improvement Amendments CLIA as qualified to perform high complexity clinical laboratory testing.    NITROFURANTOIN Value in next row Sensitive      <=1 SENSITIVEThis is a modified FDA-approved test that has been validated and its performance  characteristics determined by the reporting laboratory.  This laboratory is certified under the Clinical Laboratory Improvement Amendments CLIA as qualified to perform high complexity clinical laboratory testing.    TRIMETH/SULFA Value in next row Sensitive      <=1 SENSITIVEThis is a modified FDA-approved test that has been validated and its performance characteristics determined by the reporting laboratory.  This laboratory is certified under the Clinical Laboratory Improvement Amendments CLIA as qualified to perform high complexity clinical laboratory testing.    AMPICILLIN/SULBACTAM Value in next row Sensitive      <=1 SENSITIVEThis is a modified FDA-approved test that has been validated and its performance characteristics determined by the reporting laboratory.  This laboratory is certified under the Clinical Laboratory Improvement Amendments CLIA as qualified to perform high complexity clinical laboratory testing.    PIP/TAZO Value in next row Sensitive ug/mL     <=4 SENSITIVEThis is a modified FDA-approved test that has been validated and its performance characteristics determined by the reporting laboratory.  This laboratory is certified under the Clinical Laboratory Improvement Amendments CLIA as qualified to perform high complexity clinical laboratory testing.    MEROPENEM Value in next row Sensitive      <=4 SENSITIVEThis is a modified FDA-approved test that has been validated and its performance characteristics determined by the reporting laboratory.  This laboratory is certified under the Clinical Laboratory Improvement Amendments CLIA as qualified to perform high complexity clinical laboratory testing.    * >=100,000 COLONIES/mL ESCHERICHIA COLI  MRSA Next Gen by PCR, Nasal     Status: None   Collection Time: 08/13/24 10:43 AM   Specimen: Nasal Mucosa; Nasal Swab  Result Value Ref Range Status   MRSA by PCR Next Gen NOT DETECTED NOT DETECTED Final    Comment: (NOTE) The GeneXpert MRSA  Assay (FDA approved for NASAL specimens only), is one component of a comprehensive MRSA colonization surveillance program. It is not intended to diagnose MRSA infection nor to guide or monitor treatment for MRSA infections. Test performance is not FDA approved in patients less than 22 years old. Performed at [facility], [address] [place], [place] [address]   Resp panel by RT-PCR (RSV, Flu A&B, Covid) Anterior Nasal Swab     Status: Abnormal   Collection Time: 08/14/24 12:29 PM   Specimen: Anterior Nasal Swab  Result Value Ref Range Status   SARS Coronavirus 2 by RT PCR POSITIVE (A) NEGATIVE Final    Comment: (NOTE) SARS-CoV-2 target nucleic acids are DETECTED.  The SARS-CoV-2 RNA is generally detectable in upper respiratory specimens during the acute phase of infection. Positive results are indicative of the presence of the identified virus, but do not rule out bacterial infection or co-infection with other pathogens not detected by the test. Clinical correlation with patient history and other diagnostic information is necessary to determine patient infection status. The expected result is Negative.  Fact Sheet for Patients: BloggerCourse.com  Fact Sheet for Healthcare Providers: SeriousBroker.it  This test is not yet approved or cleared by the [place]  FDA and  has been authorized for detection and/or diagnosis of SARS-CoV-2 by FDA under an Emergency Use Authorization (  EUA).  This EUA will remain in effect (meaning this test can be used) for the duration of  the COVID-19 declaration under Section 564(b)(1) of the A ct, 21 U.S.C. section 360bbb-3(b)(1), unless the authorization is terminated or revoked sooner.     Influenza A by PCR NEGATIVE NEGATIVE Final   Influenza B by PCR NEGATIVE NEGATIVE Final    Comment: (NOTE) The Xpert Xpress SARS-CoV-2/FLU/RSV plus assay is intended as an aid in the diagnosis of  influenza from Nasopharyngeal swab specimens and should not be used as a sole basis for treatment. Nasal washings and aspirates are unacceptable for Xpert Xpress SARS-CoV-2/FLU/RSV testing.  Fact Sheet for Patients: BloggerCourse.com  Fact Sheet for Healthcare Providers: SeriousBroker.it  This test is not yet approved or cleared by the [place]  FDA and has been authorized for detection and/or diagnosis of SARS-CoV-2 by FDA under an Emergency Use Authorization (EUA). This EUA will remain in effect (meaning this test can be used) for the duration of the COVID-19 declaration under Section 564(b)(1) of the Act, 21 U.S.C. section 360bbb-3(b)(1), unless the authorization is terminated or revoked.     Resp Syncytial Virus by PCR NEGATIVE NEGATIVE Final    Comment: (NOTE) Fact Sheet for Patients: BloggerCourse.com  Fact Sheet for Healthcare Providers: SeriousBroker.it  This test is not yet approved or cleared by the [place]  FDA and has been authorized for detection and/or diagnosis of SARS-CoV-2 by FDA under an Emergency Use Authorization (EUA). This EUA will remain in effect (meaning this test can be used) for the duration of the COVID-19 declaration under Section 564(b)(1) of the Act, 21 U.S.C. section 360bbb-3(b)(1), unless the authorization is terminated or revoked.  Performed at [facility], [address] [place], [place] [address]   Respiratory (~20 pathogens) panel by PCR     Status: None   Collection Time: 08/14/24 12:29 PM   Specimen: Nasopharyngeal Swab; Respiratory  Result Value Ref Range Status   Adenovirus NOT DETECTED NOT DETECTED Final   Coronavirus 229E NOT DETECTED NOT DETECTED Final    Comment: (NOTE) The Coronavirus on the Respiratory Panel, DOES NOT test for the novel  Coronavirus (2019 nCoV)    Coronavirus HKU1 NOT DETECTED NOT DETECTED Final    Coronavirus NL63 NOT DETECTED NOT DETECTED Final   Coronavirus OC43 NOT DETECTED NOT DETECTED Final   Metapneumovirus NOT DETECTED NOT DETECTED Final   Rhinovirus / Enterovirus NOT DETECTED NOT DETECTED Final   Influenza A NOT DETECTED NOT DETECTED Final   Influenza B NOT DETECTED NOT DETECTED Final   Parainfluenza Virus 1 NOT DETECTED NOT DETECTED Final   Parainfluenza Virus 2 NOT DETECTED NOT DETECTED Final   Parainfluenza Virus 3 NOT DETECTED NOT DETECTED Final   Parainfluenza Virus 4 NOT DETECTED NOT DETECTED Final   Respiratory Syncytial Virus NOT DETECTED NOT DETECTED Final   Bordetella pertussis NOT DETECTED NOT DETECTED Final   Bordetella Parapertussis NOT DETECTED NOT DETECTED Final   Chlamydophila pneumoniae NOT DETECTED NOT DETECTED Final   Mycoplasma pneumoniae NOT DETECTED NOT DETECTED Final    Comment: Performed at [facility], [address], [place], [place] [address]      Radiology Studies: No results found.     LOS: 2 days    Elgin Lam, MD [facility] 08/15/2024, 9:43 AM   If 7PM-7AM, please contact night-coverage [email or web address]

## 2024-08-15 NOTE — Plan of Care (Signed)

## 2024-08-15 NOTE — Progress Notes (Signed)
 [facility] Liaison Note  Received request from [facility] for hospice services at home after discharge. Spoke with patient and sister to initiate education related to hospice philosophy, services and team approach to care. Patient and sister verbalized understanding of information given. Per discussion, the plan is for discharge home tomorrow if dilaudid  PCA pump and home nurse visit can be coordinated and hospice services transferred from [facility] to [facility].  DME needs discussed. Patient has the following equipment in the home: hospital bed, hoyer lift, walker, BSC. Family requests the following equipment for delivery: Dilaudid  PCA  Please send signed and completed DNR home with patient/family. Please provide prescriptions at discharge as needed to ensure ongoing symptom management.  [facility] information and contact numbers given to the patient. Please call with any concerns.  Thank you for the opportunity to participate in this patient's care.   Eleanor Nail, LPN [facility] Liaison [phone]

## 2024-08-15 NOTE — Plan of Care (Signed)

## 2024-08-15 NOTE — Progress Notes (Signed)
 Daily Progress Note   Patient Name: Stacey [name]       Date: 08/15/2024 DOB: 1946-01-25  Age: 78 y.o. MRN#: [id number] Attending Physician: Stacey [name] LABOR, MD Primary Care Physician: Stacey Leta NOVAK, MD Admit Date: 08/11/2024  Reason for Consultation/Follow-up: Pain control  Subjective:   78 y.o. female  with past medical history of metastatic breast cancer to bone, newly diagnosed pancreatic adenocarcinoma, suspected RCC,  biliary stricture with prior stent, PE, atrial fibrillation, CAD with prior stenting, hypertension, hyperlipidemia CKD stage 2, COPD, anxiety, and depression, chronic pain with intrathecal morphine  / bupivicaine pump admitted on 08/11/2024 with abdominal pain and nausea.    Admitted for acute on chronic intractable malignancy related pain.  Stacey [name] recently enrolled with hospice and had an intrathecal pain pump.  Pain pump managed by [facility] and once enrolling with hospice their plan is to discontinue the intrathecal pain pump and transition to alternative therapy. Workup also revealed findings concerning for UTI. On IV abx for this.    Office visit from 08/11/2024 [facility]. Patient's son reported they decreased Stacey [name] rate significantly but in review of notes it appears they discontinued the pain medication and transitioned to saline only. Hospice plan per notes were to transition to MS Contin  30 BID with oral short acting morphine  q 3-4 hrs PRN. Son received several different types of morphine  but had not yet administered them as reports he was advised Stacey [name] would be switched to Fentanyl  pain patch. He has not been provided the Fentanyl  pain patch as of yet. No hospice notes available for review.    Prior opioid regimen per [facility] pain clinic: Dilaudid  2 mg q 6 hrs PRN, morphine  (PF) 15 mg/mL, BUPivacaine  (PF) 15 mg/mL 40  mL rate listed as directed, unable to locate exact rate in most recent or prior notes. Stacey [name] is also on adjuvant therapy with Tylenol , Voltaren  gel, Cymbalta , and Lyrica . PDMP reviewed.  I see where Stacey [name] was prescribed morphine  extended release tablets and morphine  concentrate.  Last hydromorphone  prescription was for 2 mg tablets on 07/29/2024 it was only a 13-day supply. Per son was using PRN Dilaudid  tabs prior to hospital admission but ran out (? Date).  08/12/2024: GOC deferred given pain severity, initial completed, was on intrathecal pain pump and oral dilaudid , admitted to hospice and plan was to transition to oral regimen only. Appears to have been off opioids prior to admission so short acting pain meds started with plan to transition to long and short acting.   Today, labs independently reviewed.  I see where Stacey [name] tested positive for COVID on 08/14/2024.  Urine culture resulted in positive for E. coli at greater than 100,000 CFU's.  LFTs obtained.  ALP elevated at 132.  Other transaminases are normal.  Stacey [name] does have low albumin  and low protein levels in the setting of underlying malignancy and end-stage disease.  Vital signs reviewed.  SBP levels in the 140s to 150s.  Occasional tachycardia noted but seem to be correlated with pain levels.  Symptom medications reviewed. On 24-hour look back, patient continues to receive MS contin  30 mg PO BID scheduled (60 OME), total of 4 mg of IV dilaudid , and 2 mg PO Dilaudid . Also received 5 mg Hydrocodone  (Hycodan cough syrup) for cough. Had total of 0.5 mg Xanax  for anxiety on 24 hr lookback.   Today, patient reports ongoing and poorly controlled pain.  Stacey [name] states Stacey [name] pain is currently 10 out of 10 localizes Stacey [name] pain to Stacey [name] lower abdomen.  When they give Stacey [name] the IV Dilaudid  it does help some but the lowest Stacey [name] pain ever gets is an 8 out of 10.  Stacey [name] states that the IV Dilaudid  rapidly wears off as well.  Stacey [name] does have as needed Xanax  which Stacey [name] finds helpful for  anxiety and allows Stacey [name] to get some rest.  Discussed goals of care given patient appears more comfortable today.  Stacey [name] main goal is to return home, be comfortable, and be able to pass away peacefully at home.  Stacey [name] lives at home with Stacey [name] son Stacey [name] who provides care for Stacey [name].  Stacey [name] is completely bedbound and has been for 3 years.  Requires Hoyer lift for transfers.  Stacey [name] has a good appetite.  Stacey [name] enjoys being with people and has a strong faith.  Stacey [name] has been attending [name] for many years and has many friends who come visit Stacey [name] from church.  Stacey [name] shares some of Stacey [name] cancer and pain history with me.  Stacey [name] was living in [place] and seeing a pain management specialist therefore L1/L2 pathology (compression fracture and sclerotic bone lesions).  Stacey [name] had an intrathecal pain pump placed and followed took several months to get pain well-controlled they were ultimately able to achieve reasonable pain control.  Unfortunately, Stacey [name] had some worsening pain related to Stacey [name] cancer and the decision to pursue hospice and forego further curative treatment.  We discussed Stacey [name] prognosis.  Stacey [name] is accepting of this and is ready to go see the [place] but does want to be at home when it happens.  We discussed various treatment options and that we are having a great difficulty getting Stacey [name] pain managed here with long-acting oral opiates and both oral and IV pain medication.  Discussed various pain options including PCA, reverting back to intrathecal pump, versus continued oral adjustments.  Patient states Stacey [name] just wants to be comfortable.  We are both concerned about ability to control Stacey [name] pain with oral route.  Patient is agreeable to trial of PCA and would be interested in having pain control via PCA even while at home.  We discussed switching to an alternative hospice as current hospice does not have PCA capabilities.  Stacey [name] is in agreement with transitioning to a different hospice as long as the ultimate plan is to return home and receive hospice  services there.  Stacey [name] is agreeable to short inpatient hospice stay if needed to ensure appropriate symptom management before transition home.  Chart review/care coordination:  Completed extensive chart review including EPIC notes, labs (independently reviewed), outside specialist notes, vital signs, and medication administration record. Coordinated care with attending physician,  pharmacist, bedside nursing staff, hospice liaison, and spiritual care. I also spoke with staff at [facility] pain clinic as patient remained under the impression that Stacey [name] continued to receive some opioid via Stacey [name] intrathecal pump.  However, staff at the office confirm that Stacey [name] is not receiving any medication other than saline through the intrathecal pump.  Spoke with patient's son Stacey [name] and daughter Odella via phone both are also in agreement with transitioning to PCA for pain control and transfer care to [facility] hospice with the ultimate goal of patient returning home (also agreeable with short stay in inpatient hospice if needed but ultimate goal to return home).    Length of Stay: 2   Physical Exam Constitutional:      General: Stacey [name] is not in acute distress.    Appearance: Stacey [name] is ill-appearing. Stacey [name] is not toxic-appearing.     Comments: Appears uncomfortable  Pulmonary:     Effort: Pulmonary effort is normal. No respiratory distress.  Skin:    General: Skin is warm and dry.  Neurological:     Mental Status: Stacey [name] is alert.             Vital Signs: BP (!) 152/92 (BP Location: Left Arm)   Pulse (!) 103   Temp 98.8 F (37.1 C) (Oral)   Resp 20   Ht 5' (1.524 m)   Wt 74.4 kg   SpO2 92%   BMI 32.03 kg/m  SpO2: SpO2: 92 % O2 Device: O2 Device: Room Air O2 Flow Rate:        Palliative Assessment/Data: 30%   Palliative Care Assessment & Plan   Patient Profile/Assessment:  78 year old female with numerous chronic medical problems admitted for acute intractable cancer pain (metastatic breast cancer to bone,  newly diagnosed pancreatic adenocarcinoma, suspected RCC).  Patient was previously managed at [facility] pain clinic with intrathecal pump and oral hydromorphone .  Patient's cancer progressed and decision was made to transition to hospice care.  With this transition, patient was to transition to oral opioids only.  Stacey [name] intrathecal pain pump was switched from morphine  and bupivacaine  and oral Dilaudid  as needed to saline and Stacey [name] was switched to oral morphine .  Unfortunately, there seem to be some confusion and patient's son stated he was not administering the morphine  because there was some mention of Stacey [name] being placed on fentanyl .  Ultimately, admitted to the hospital for pain control.  Attempted to transition Stacey [name] to oral MS Contin  30 mg p.o. twice daily with IV Dilaudid  1 mg every 2 hours as needed.  Unfortunately, patient continues to have poorly controlled pain.  After long discussion with patient and family, decision was made to transition to Dilaudid  PCA pump.  Patient's ultimate goal is to return home under hospice care.  Stacey [name] is agreeable to transitioning to a hospice agency that will be able to accommodate home PCA pump.  Son and daughter are also in agreement.  Will continue to monitor and adjust pain regimen as indicated.  Recommendations/Plan:  Discontinue MS Contin  and IV Dilaudid  Transition to Dilaudid  PCA pump Transfer care to hospice agency that can manage IV symptom meds at home. Once stable, discharge with hospice support either for short-term inpatient hospice stay to ensure seamless transition home versus home with hospice Palliative medicine team will continue to follow for ongoing goals of care discussion, symptom management, and coordination of care.  Symptom management:  Discontinued MS Contin  Discontinued Dilaudid  1 mg IV every 2 hours as needed Discontinue oral Dilaudid  2 mg every 4 hours  as needed Ordered Dilaudid  PCA pump: Loading dose of 0.5 mg, basal rate of 0.4 mg/h, PCA patient  bolus dose of 0.3 every 8 minutes with a max 1 hour dose limit of 1.25.  Performed opioid conversion manually as well as consulted pharmacy Continue alprazolam  0.5 mg 2 times daily as needed Continue Tylenol  as needed   Prognosis:  < 6 months    Discharge Planning: To Be Determined, either home with hospice or inpatient hospice for symptom control with ultimate plan to return home    Detailed review of medical records (labs, imaging, vital signs), medically appropriate exam, discussed with treatment team, counseling and education to patient, family, & staff, documenting clinical information, medication management, coordination of care    Billing based on MDM: High  Problems Addressed: One acute or chronic illness or injury that poses a threat to life or bodily function  Amount and/or Complexity of Data: Category 3:Discussion of management or test interpretation with external physician/other qualified health care professional/appropriate source (not separately reported)  Risks: Parenteral controlled substances         Laymon CHRISTELLA Pinal, NP  Palliative Medicine Team Team phone # [phone]  Thank you for allowing the Palliative Medicine Team to assist in the care of this patient. Please utilize secure chat with additional questions, if there is no response within 30 minutes please call the above phone number.  Palliative Medicine Team providers are available by phone from 7am to 7pm daily and can be reached through the team cell phone.  Should this patient require assistance outside of these hours, please call the patient's attending physician.

## 2024-08-16 ENCOUNTER — Other Ambulatory Visit: Payer: Self-pay

## 2024-08-16 DIAGNOSIS — Z515 Encounter for palliative care: Secondary | ICD-10-CM

## 2024-08-16 DIAGNOSIS — Z7189 Other specified counseling: Secondary | ICD-10-CM

## 2024-08-16 DIAGNOSIS — N3000 Acute cystitis without hematuria: Secondary | ICD-10-CM | POA: Diagnosis not present

## 2024-08-16 DIAGNOSIS — Z79899 Other long term (current) drug therapy: Secondary | ICD-10-CM

## 2024-08-16 DIAGNOSIS — U071 COVID-19: Secondary | ICD-10-CM | POA: Diagnosis not present

## 2024-08-16 DIAGNOSIS — R52 Pain, unspecified: Secondary | ICD-10-CM | POA: Diagnosis not present

## 2024-08-16 MED ORDER — NAPHAZOLINE-PHENIRAMINE 0.025-0.3 % OP SOLN
1.0000 [drp] | Freq: Four times a day (QID) | OPHTHALMIC | Status: DC | PRN
  Filled 2024-08-16: qty 5

## 2024-08-16 MED ORDER — NAPHAZOLINE-GLYCERIN 0.012-0.25 % OP SOLN
1.0000 [drp] | Freq: Four times a day (QID) | OPHTHALMIC | Status: DC | PRN
  Filled 2024-08-16: qty 15

## 2024-08-16 MED ORDER — PANTOPRAZOLE SODIUM 40 MG PO TBEC
40.0000 mg | DELAYED_RELEASE_TABLET | Freq: Every day | ORAL | Status: DC
  Administered 2024-08-16 – 2024-08-17 (×2): 40 mg via ORAL
  Filled 2024-08-16 (×2): qty 1

## 2024-08-16 NOTE — Progress Notes (Signed)
 Daily Progress Note   Patient Name: Stacey [name]       Date: 08/16/2024 DOB: [birth year]  Age: 78 y.o. MRN#: [id number] Attending Physician: Briana Elgin LABOR, MD Primary Care Physician: Rosamond Leta NOVAK, MD Admit Date: 08/11/2024  Reason for Consultation/Follow-up: Pain control  Subjective:  78 y.o. female  with past medical history of metastatic breast cancer to bone, newly diagnosed pancreatic adenocarcinoma, suspected RCC,  biliary stricture with prior stent, PE, atrial fibrillation, CAD with prior stenting, hypertension, hyperlipidemia CKD stage 2, COPD, anxiety, and depression, chronic pain with intrathecal morphine  / bupivicaine pump admitted on 08/11/2024 with abdominal pain and nausea.    Admitted for acute on chronic intractable malignancy related pain.  She recently enrolled with hospice and had an intrathecal pain pump.  Pain pump managed by [facility] and once enrolling with hospice their plan is to discontinue the intrathecal pain pump and transition to alternative therapy. Workup also revealed findings concerning for UTI. On IV abx for this.    Office visit from 08/11/2024 [facility]. Patient's son reported they decreased her rate significantly but in review of notes it appears they discontinued the pain medication and transitioned to saline only. Hospice plan per notes were to transition to MS Contin  30 BID with oral short acting morphine  q 3-4 hrs PRN. Son received several different types of morphine  but had not yet administered them as reports he was advised she would be switched to Fentanyl  pain patch. He has not been provided the Fentanyl  pain patch as of yet. No hospice notes available for review.    Prior opioid regimen per [facility] pain clinic: Dilaudid  2 mg q 6 hrs PRN, morphine  (PF) 15 mg/mL, BUPivacaine  (PF) 15 mg/mL 40  mL rate listed as directed, unable to locate exact rate in most recent or prior notes. She is also on adjuvant therapy with Tylenol , Voltaren  gel, Cymbalta , and Lyrica . PDMP reviewed.  I see where she was prescribed morphine  extended release tablets and morphine  concentrate.  Last hydromorphone  prescription was for 2 mg tablets on 07/29/2024 it was only a 13-day supply. Per son was using PRN Dilaudid  tabs prior to hospital admission but ran out (? Date).   08/12/2024: GOC deferred given pain severity, initial completed, was on intrathecal pain pump and oral dilaudid , admitted to hospice and plan was to transition to oral regimen only. Appears to have been off opioids prior to admission so short acting pain meds started with plan to transition to long and short acting.   08/13/2024: transitioned to Dilaudid  PCA pump with plan to d/c under [facility] hospice agency who can  manage PCA pumps in the home.   Today, medication administration record reviewed.  Patient continues on Dilaudid  PCA with continuous infusion of 0.4 mg/h with 0.3 mg bolus available every 8 minutes as needed.  On 24-hour look back, patient has had a total of 12.6 mg of IV Dilaudid .  Pushing PCA button anywhere from 0 to maximum 4 times per assessment interval.  She did not require any Xanax  or other symptom meds on 24-hour look back.  Vital signs reviewed.  She has had some elevated blood pressure readings and mild tachycardia but otherwise vital have been stable.  O2 sats have been stable on room air.   Patient indicates that she is feeling much better.  She feels her pain is more adequately controlled.  She states that she did eat which will cause an increase in pain and so she rates her pain at an 8 out of 10 currently.  She states that her pain level has been at an 8 out of 10 for many years now and that this is her baseline.  She is hopeful to return home and feels that a pain level of 8 is indicative of adequate pain control.  She also  reports having regular bowel movements.  Overall, she is feeling much better and feels ready to return home.  We discussed the plan to return home with hospice support versus temporary inpatient hospice stay for symptom management.  Patient is agreeable to either plan as long as ultimately the plan is to get her home as her goal is to pass peacefully and comfortably at home.  Chart review/care coordination:  Completed extensive chart review including EPIC notes, medication administration record, vital signs. Coordinated care with attending physician, TOC, bedside nursing staff, and hospice liaison.   Length of Stay: 3   Physical Exam Constitutional:      General: She is not in acute distress.    Comments: Chronically ill-appearing, sitting up in bed, appears much more comfortable than yesterday.  She was smiling and able to carry on conversation without evidence of significant pain.  Pulmonary:     Effort: Pulmonary effort is normal. No respiratory distress.  Skin:    General: Skin is warm and dry.  Neurological:     Mental Status: She is alert.             Vital Signs: BP (!) 153/101 (BP Location: Left Arm)   Pulse (!) 108   Temp 98.6 F (37 C) (Oral)   Resp 14   Ht 5' (1.524 m)   Wt 74.4 kg   SpO2 96%   BMI 32.03 kg/m  SpO2: SpO2: 96 % O2 Device: O2 Device: Room Air O2 Flow Rate: O2 Flow Rate (L/min): 0 L/min      Palliative Assessment/Data: 30%   Palliative Care Assessment & Plan   Patient Profile/Assessment:  78 year old female with numerous chronic medical problems admitted for acute intractable cancer pain (metastatic breast cancer to bone, newly diagnosed pancreatic adenocarcinoma, suspected RCC).  Patient was previously managed at [facility] pain clinic with intrathecal pump and oral hydromorphone .  Patient's cancer progressed and decision was made to transition to hospice care.  With this transition, patient was to transition to oral opioids only.  Her intrathecal  pain pump was switched from morphine  and bupivacaine  and oral Dilaudid  as needed to saline and she was switched to oral morphine .  Unfortunately, there seem to be some confusion and patient's son stated he was not administering the morphine  because there  was some mention of her being placed on fentanyl .  Ultimately, admitted to the hospital for pain control.  Attempted to transition her to oral MS Contin  30 mg p.o. twice daily with IV Dilaudid  1 mg every 2 hours as needed.  On 08/15/2024 after discussion with patient and family, decision was made to transition to Dilaudid  PCA pump.  On reassessment, patient appears much more comfortable and is alert and tolerating PCA well. She indicates pain is at baseline. Encouraged to use the PCA bolus button as needed for breakthrough pain. She indicates she feels her pain is well-controlled and feels comfortable returning home.  Plan is to return home under hospice agency that can manage PCA.  Per hospice liaison, hospice staff can bring supplies to restart Dilaudid  PCA pump at her home upon discharge and are working on getting this coordinated.  Tentative discharge is possible for tomorrow.   Recommendations/Plan:  Continue DNR/DNI/comfort directed care Continue Dilaudid  PCA pump, current doses appear effective, patient reports pain is at baseline and is tolerating it well PICC line to be placed for long term pain management via PCA Discharge home with hospice agency that can manage Dilaudid  PCA in the home While inpatient, palliative medicine team will continue to follow for ongoing goals of care discussion, symptom management, and coordination of care.    Symptom management:  Continue Dilaudid  PCA pump: Loading dose of 0.5 mg, basal rate of 0.4 mg/h, PCA patient bolus dose of 0.3 every 8 minutes with a max 1 hour dose limit of 1.25.   Continue alprazolam  0.5 mg 2 times daily as needed Continue Tylenol  as needed  Prognosis:  < 6 months    Discharge  Planning: Home with Hospice    Detailed review of medical records (labs, imaging, vital signs), medically appropriate exam, discussed with treatment team, counseling and education to patient, family, & staff, documenting clinical information, medication management, coordination of care    Billing based on MDM: High  Problems Addressed: One acute or chronic illness or injury that poses a threat to life or bodily function  Amount and/or Complexity of Data: Category 3:Discussion of management or test interpretation with external physician/other qualified health care professional/appropriate source (not separately reported)  Risks: Parenteral controlled substances         Laymon CHRISTELLA Pinal, NP  Palliative Medicine Team Team phone # [phone]  Thank you for allowing the Palliative Medicine Team to assist in the care of this patient. Please utilize secure chat with additional questions, if there is no response within 30 minutes please call the above phone number.  Palliative Medicine Team providers are available by phone from 7am to 7pm daily and can be reached through the team cell phone.  Should this patient require assistance outside of these hours, please call the patient's attending physician.

## 2024-08-16 NOTE — Plan of Care (Signed)

## 2024-08-16 NOTE — Progress Notes (Signed)
   08/15/24 1011  Spiritual Encounters  Type of Visit Initial  Care provided to: Pt and family  Reason for visit End-of-life  OnCall Visit No  Spiritual Framework  Presenting Themes Meaning/purpose/sources of inspiration;Significant life change;Values and beliefs;Impactful experiences and emotions;Courage hope and growth  Community/Connection Family;Friend(s);Faith community;Spiritual leader  Patient Stress Factors Major life changes;Loss  Family Stress Factors Loss;Major life changes  Interventions  Spiritual Care Interventions Made Established relationship of care and support;Compassionate presence;Reflective listening;Normalization of emotions;Narrative/life review;Meaning making;Explored values/beliefs/practices/strengths;Prayer  Intervention Outcomes  Outcomes Connection to spiritual care;Awareness around self/spiritual resourses;Connection to values and goals of care;Awareness of support;Autonomy/agency;Reduced fear  Spiritual Care Plan  Spiritual Care Issues Still Outstanding Chaplain will continue to follow   Met with patient and son in room and was welcomed warmly bedside. Engaged patient in reflection around her upbringing, relationships, spiritual heritage, and illness story. She was very joyful today and expressed a gratefulness for her life. She stated that she is ready o go both to her home and to her 'heavenly home'. This Clinical research associate provided space for her to reflect and provided prayer at close of visit. Will continue to visit in order to provide spiritual support and to assess for spiritual need.   Ether Blush, M.Div Lead Chaplain [facility] [phone]

## 2024-08-16 NOTE — TOC Progression Note (Signed)
 Transition of Care [facility]) - Progression Note    Patient Details  Name: Stacey [name] MRN: [id number] Date of Birth: 1946/11/26  Transition of Care [facility]) CM/SW Contact  Hoy DELENA Bigness, LCSW Phone Number: 08/16/2024, 12:44 PM  Clinical Narrative:    [facility] scheduled to have RN meet pt at home tomorrow 9/17 at 2pm. RN will bring PCA with them at this time. If pt's pain is unable to be controlled at home then plan would be to admit pt to [facility]. Spoke with son and confirmed plan for pt to return home with hospice tomorrow. Pt will need bolus for pain prior to transport. ICM will continue to follow.    Expected Discharge Plan: Home w Hospice Care Barriers to Discharge: Continued Medical Work up               Expected Discharge Plan and Services     Post Acute Care Choice: Hospice Living arrangements for the past 2 months: Single Family Home                                       Social Drivers of Health (SDOH) Interventions SDOH Screenings   Food Insecurity: No Food Insecurity (08/12/2024)  Housing: Low Risk  (08/12/2024)  Transportation Needs: No Transportation Needs (08/12/2024)  Utilities: Not At Risk (08/12/2024)  Depression (PHQ2-9): Low Risk  (08/03/2024)  Financial Resource Strain: High Risk (01/04/2024)   Received from [facility]  Physical Activity: Inactive (08/17/2023)   Received from [facility]  Social Connections: Unknown (08/12/2024)  Stress: No Stress Concern Present (08/17/2023)   Received from [facility]  Tobacco Use: Medium Risk (08/12/2024)  Health Literacy: Inadequate Health Literacy (08/17/2023)   Received from [facility]    Readmission Risk Interventions    08/15/2024    1:44 PM 06/08/2024   11:19 AM 05/20/2024    9:15 AM  Readmission Risk Prevention Plan  Transportation Screening Complete Complete Complete  Medication Review Oceanographer) Complete  Complete Complete  PCP or Specialist appointment within 3-5 days of discharge Complete    HRI or Home Care Consult Complete Complete Complete  SW Recovery Care/Counseling Consult Complete Complete Complete  Palliative Care Screening Complete Not Applicable Not Applicable  Skilled Nursing Facility Not Applicable Not Applicable Not Applicable

## 2024-08-16 NOTE — Progress Notes (Signed)
 PROGRESS NOTE    Stacey [name]  FMW:[id number] DOB: [birth year] DOA: 08/11/2024 PCP: Rosamond Leta KATHEE, MD   Brief Narrative: Stacey [name] is a 78 y.o. female with a history of stage IV breast cancer, pancreatic adenocarcinoma, possible renal cell carcinoma, PE, CAD s/p stent, atrial fibrillation, hypertension, hyperlipidemia, CKD, COPD, anxiety, depression, chronic pain.  Patient presented secondary to abdominal pain secondary to known metastatic breast cancer, in addition to known pancreatic cancer.   Assessment/Plan:  Acute on chronic pain Malignancy related pain Patient recently enrolled with Hospice and recently started on adjusted pain regimen. Patient also with a home intrathecal pump. Palliative care consulted and have transitioned patient to a PCA with significant improvement in patient's pain management. Patient to discharge with [facility] hospice services. -Palliative care medicine recommendations: PCA pump  Nausea -Continue Zofran  as needed  Post-prandial abdominal pain Likely related to underlying cancer. Symptoms started two weeks prior. No obvious etiology on CT abdomen/pelvis although there was increased air-fluid noted in the stomach; no obvious evidence of obstruction. Lipase and hepatic function panel unremarkable for etiology. Pain better controlled on new regimen. -Continue pain management  UTI Urinalysis suggests possible UTI. Urine culture obtained and Ceftriaxone  started empirically. Urine culture significant for E. Coli. Completed antibiotic course.  Stage IV breast cancer Noted. Patient enrolled with hospice.  Pancreatic adenocarcinoma Noted.  Renal cell carcinoma Noted.  History of biliary stricture S/p stent placement. LFTs normal.  History of pulmonary embolism Patient is on Eliquis  as an outpatient. Note concerns for affordability of Eliquis . -Continue Eliquis   Atrial fibrillation Unclear if chronic versus paroxysmal atrial  fibrillation. Stable. Currently on Eliquis  and metoprolol . -Continue metoprolol   CAD S/p stent placement -Continue Plavix   COVID-19 infection Mostly asymptomatic with no hypoxia. -Supportive care -Airborne/Contact precautions  Primary hypertension Patient is on amlodipine , metoprolol  and losartan  as an outpatient. Losartan  was held on admission to reduce pill burden. Will discontinue on discharge. -Continue amlodipine  and metoprolol   Hyperlipidemia Patient is on Lipitor  as an outpatient, which was held on admission to reduce pill burden. Will discontinue on discharge.  CKD stage II Stable.  COPD -Continue albuterol  as needed  Anxiety -Continue Xanax   Obesity Estimated body mass index is 32.03 kg/m as calculated from the following:   Height as of this encounter: 5' (1.524 m).   Weight as of this encounter: 74.4 kg.   DVT prophylaxis: Eliquis  Code Status:   Code Status: Limited: Do not attempt resuscitation (DNR) -DNR-LIMITED -Do Not Intubate/DNI  Family Communication: None at bedside Disposition Plan: Discharge either home with hospice vs hospice facility to set up for home PCA pump   Consultants:  Palliative care medicine  Procedures:  None  Antimicrobials: Ceftriaxone     Subjective: Pain is significantly improved. No  issues overnight. Has to eat small bites of food over the day.  Objective: BP (!) 153/101 (BP Location: Left Arm)   Pulse (!) 108   Temp 98.6 F (37 C) (Oral)   Resp 14   Ht 5' (1.524 m)   Wt 74.4 kg   SpO2 96%   BMI 32.03 kg/m   Examination:  General exam: Appears calm and comfortable   Data Reviewed: I have personally reviewed following labs and imaging studies   Last CBC Lab Results  Component Value Date   WBC 5.6 08/11/2024   HGB 10.8 (L) 08/11/2024   HCT 34.4 (L) 08/11/2024   MCV 103.0 (H) 08/11/2024   MCH 32.3 08/11/2024   RDW 14.3 08/11/2024   PLT  240 08/11/2024     Last metabolic panel Lab Results   Component Value Date   GLUCOSE 165 (H) 08/11/2024   NA 144 08/11/2024   K 4.3 08/11/2024   CL 105 08/11/2024   CO2 27 08/11/2024   BUN 12 08/11/2024   CREATININE 0.50 08/11/2024   GFRNONAA >60 08/11/2024   CALCIUM  8.6 (L) 08/11/2024   PHOS 1.9 (L) 03/15/2024   PROT 5.3 (L) 08/14/2024   ALBUMIN  2.0 (L) 08/14/2024   BILITOT 0.8 08/14/2024   ALKPHOS 132 (H) 08/14/2024   AST 36 08/14/2024   ALT 20 08/14/2024   ANIONGAP 12 08/11/2024     Creatinine Clearance: Estimated Creatinine Clearance: 53.1 mL/min (by C-G formula based on SCr of 0.5 mg/dL).  Recent Results (from the past 240 hours)  Urine Culture     Status: Abnormal   Collection Time: 08/11/24  9:44 PM   Specimen: Urine, Clean Catch  Result Value Ref Range Status   Specimen Description   Final    URINE, CLEAN CATCH Performed at [facility], [address] [place], [place] [address]    Special Requests   Final    NONE Performed at [facility], [address] [place], [place] [address]    Culture >=100,000 COLONIES/mL ESCHERICHIA COLI (A)  Final   Report Status 08/14/2024 FINAL  Final   Organism ID, Bacteria ESCHERICHIA COLI (A)  Final      Susceptibility   Escherichia coli - MIC*    AMPICILLIN 4 SENSITIVE Sensitive     CEFAZOLIN  (URINE) Value in next row Sensitive      <=1 SENSITIVEThis is a modified FDA-approved test that has been validated and its performance characteristics determined by the reporting laboratory.  This laboratory is certified under the Clinical Laboratory Improvement Amendments CLIA as qualified to perform high complexity clinical laboratory testing.    CEFEPIME Value in next row Sensitive      <=1 SENSITIVEThis is a modified FDA-approved test that has been validated and its performance characteristics determined by the reporting laboratory.  This laboratory is certified under the Clinical Laboratory Improvement Amendments CLIA as qualified to perform high complexity clinical laboratory testing.     ERTAPENEM Value in next row Sensitive      <=1 SENSITIVEThis is a modified FDA-approved test that has been validated and its performance characteristics determined by the reporting laboratory.  This laboratory is certified under the Clinical Laboratory Improvement Amendments CLIA as qualified to perform high complexity clinical laboratory testing.    CEFTRIAXONE  Value in next row Sensitive      <=1 SENSITIVEThis is a modified FDA-approved test that has been validated and its performance characteristics determined by the reporting laboratory.  This laboratory is certified under the Clinical Laboratory Improvement Amendments CLIA as qualified to perform high complexity clinical laboratory testing.    CIPROFLOXACIN  Value in next row Intermediate      <=1 SENSITIVEThis is a modified FDA-approved test that has been validated and its performance characteristics determined by the reporting laboratory.  This laboratory is certified under the Clinical Laboratory Improvement Amendments CLIA as qualified to perform high complexity clinical laboratory testing.    GENTAMICIN Value in next row Sensitive      <=1 SENSITIVEThis is a modified FDA-approved test that has been validated and its performance characteristics determined by the reporting laboratory.  This laboratory is certified under the Clinical Laboratory Improvement Amendments CLIA as qualified to perform high complexity clinical laboratory testing.    NITROFURANTOIN Value in next row  Sensitive      <=1 SENSITIVEThis is a modified FDA-approved test that has been validated and its performance characteristics determined by the reporting laboratory.  This laboratory is certified under the Clinical Laboratory Improvement Amendments CLIA as qualified to perform high complexity clinical laboratory testing.    TRIMETH/SULFA Value in next row Sensitive      <=1 SENSITIVEThis is a modified FDA-approved test that has been validated and its performance characteristics  determined by the reporting laboratory.  This laboratory is certified under the Clinical Laboratory Improvement Amendments CLIA as qualified to perform high complexity clinical laboratory testing.    AMPICILLIN/SULBACTAM Value in next row Sensitive      <=1 SENSITIVEThis is a modified FDA-approved test that has been validated and its performance characteristics determined by the reporting laboratory.  This laboratory is certified under the Clinical Laboratory Improvement Amendments CLIA as qualified to perform high complexity clinical laboratory testing.    PIP/TAZO Value in next row Sensitive ug/mL     <=4 SENSITIVEThis is a modified FDA-approved test that has been validated and its performance characteristics determined by the reporting laboratory.  This laboratory is certified under the Clinical Laboratory Improvement Amendments CLIA as qualified to perform high complexity clinical laboratory testing.    MEROPENEM Value in next row Sensitive      <=4 SENSITIVEThis is a modified FDA-approved test that has been validated and its performance characteristics determined by the reporting laboratory.  This laboratory is certified under the Clinical Laboratory Improvement Amendments CLIA as qualified to perform high complexity clinical laboratory testing.    * >=100,000 COLONIES/mL ESCHERICHIA COLI  MRSA Next Gen by PCR, Nasal     Status: None   Collection Time: 08/13/24 10:43 AM   Specimen: Nasal Mucosa; Nasal Swab  Result Value Ref Range Status   MRSA by PCR Next Gen NOT DETECTED NOT DETECTED Final    Comment: (NOTE) The GeneXpert MRSA Assay (FDA approved for NASAL specimens only), is one component of a comprehensive MRSA colonization surveillance program. It is not intended to diagnose MRSA infection nor to guide or monitor treatment for MRSA infections. Test performance is not FDA approved in patients less than 42 years old. Performed at [facility], [address], [place], [place] [address]    Resp panel by RT-PCR (RSV, Flu A&B, Covid) Anterior Nasal Swab     Status: Abnormal   Collection Time: 08/14/24 12:29 PM   Specimen: Anterior Nasal Swab  Result Value Ref Range Status   SARS Coronavirus 2 by RT PCR POSITIVE (A) NEGATIVE Final    Comment: (NOTE) SARS-CoV-2 target nucleic acids are DETECTED.  The SARS-CoV-2 RNA is generally detectable in upper respiratory specimens during the acute phase of infection. Positive results are indicative of the presence of the identified virus, but do not rule out bacterial infection or co-infection with other pathogens not detected by the test. Clinical correlation with patient history and other diagnostic information is necessary to determine patient infection status. The expected result is Negative.  Fact Sheet for Patients: BloggerCourse.com  Fact Sheet for Healthcare Providers: SeriousBroker.it  This test is not yet approved or cleared by the [place]  FDA and  has been authorized for detection and/or diagnosis of SARS-CoV-2 by FDA under an Emergency Use Authorization (EUA).  This EUA will remain in effect (meaning this test can be used) for the duration of  the COVID-19 declaration under Section 564(b)(1) of the A ct, 21 U.S.C. section 360bbb-3(b)(1), unless the authorization is terminated or revoked  sooner.     Influenza A by PCR NEGATIVE NEGATIVE Final   Influenza B by PCR NEGATIVE NEGATIVE Final    Comment: (NOTE) The Xpert Xpress SARS-CoV-2/FLU/RSV plus assay is intended as an aid in the diagnosis of influenza from Nasopharyngeal swab specimens and should not be used as a sole basis for treatment. Nasal washings and aspirates are unacceptable for Xpert Xpress SARS-CoV-2/FLU/RSV testing.  Fact Sheet for Patients: BloggerCourse.com  Fact Sheet for Healthcare Providers: SeriousBroker.it  This test is not yet approved  or cleared by the [place]  FDA and has been authorized for detection and/or diagnosis of SARS-CoV-2 by FDA under an Emergency Use Authorization (EUA). This EUA will remain in effect (meaning this test can be used) for the duration of the COVID-19 declaration under Section 564(b)(1) of the Act, 21 U.S.C. section 360bbb-3(b)(1), unless the authorization is terminated or revoked.     Resp Syncytial Virus by PCR NEGATIVE NEGATIVE Final    Comment: (NOTE) Fact Sheet for Patients: BloggerCourse.com  Fact Sheet for Healthcare Providers: SeriousBroker.it  This test is not yet approved or cleared by the [place]  FDA and has been authorized for detection and/or diagnosis of SARS-CoV-2 by FDA under an Emergency Use Authorization (EUA). This EUA will remain in effect (meaning this test can be used) for the duration of the COVID-19 declaration under Section 564(b)(1) of the Act, 21 U.S.C. section 360bbb-3(b)(1), unless the authorization is terminated or revoked.  Performed at [facility], [address] [place], [place] [address]   Respiratory (~20 pathogens) panel by PCR     Status: None   Collection Time: 08/14/24 12:29 PM   Specimen: Nasopharyngeal Swab; Respiratory  Result Value Ref Range Status   Adenovirus NOT DETECTED NOT DETECTED Final   Coronavirus 229E NOT DETECTED NOT DETECTED Final    Comment: (NOTE) The Coronavirus on the Respiratory Panel, DOES NOT test for the novel  Coronavirus (2019 nCoV)    Coronavirus HKU1 NOT DETECTED NOT DETECTED Final   Coronavirus NL63 NOT DETECTED NOT DETECTED Final   Coronavirus OC43 NOT DETECTED NOT DETECTED Final   Metapneumovirus NOT DETECTED NOT DETECTED Final   Rhinovirus / Enterovirus NOT DETECTED NOT DETECTED Final   Influenza A NOT DETECTED NOT DETECTED Final   Influenza B NOT DETECTED NOT DETECTED Final   Parainfluenza Virus 1 NOT DETECTED NOT DETECTED Final   Parainfluenza  Virus 2 NOT DETECTED NOT DETECTED Final   Parainfluenza Virus 3 NOT DETECTED NOT DETECTED Final   Parainfluenza Virus 4 NOT DETECTED NOT DETECTED Final   Respiratory Syncytial Virus NOT DETECTED NOT DETECTED Final   Bordetella pertussis NOT DETECTED NOT DETECTED Final   Bordetella Parapertussis NOT DETECTED NOT DETECTED Final   Chlamydophila pneumoniae NOT DETECTED NOT DETECTED Final   Mycoplasma pneumoniae NOT DETECTED NOT DETECTED Final    Comment: Performed at [facility], [address], [place], [place] [address]      Radiology Studies: No results found.     LOS: 3 days    Elgin Lam, MD [facility] 08/16/2024, 11:57 AM   If 7PM-7AM, please contact night-coverage [email or web address]

## 2024-08-17 DIAGNOSIS — N3000 Acute cystitis without hematuria: Secondary | ICD-10-CM | POA: Diagnosis not present

## 2024-08-17 LAB — GLUCOSE, CAPILLARY: Glucose-Capillary: 118 mg/dL — ABNORMAL HIGH (ref 70–99)

## 2024-08-17 MED ORDER — HYDROMORPHONE 1 MG/ML IV SOLN: 1.0000 mg | INTRAVENOUS | Status: AC

## 2024-08-17 MED ORDER — SODIUM CHLORIDE 0.9% FLUSH: 10.0000 mL | INTRAVENOUS | Status: DC | PRN

## 2024-08-17 MED ORDER — SODIUM CHLORIDE 0.9% FLUSH
10.0000 mL | Freq: Two times a day (BID) | INTRAVENOUS | Status: DC
  Administered 2024-08-17: 10 mL

## 2024-08-17 MED ORDER — POLYETHYLENE GLYCOL 3350 17 G PO PACK: 17.0000 g | PACK | Freq: Every day | ORAL | Status: AC | PRN

## 2024-08-17 MED ORDER — CHLORHEXIDINE GLUCONATE CLOTH 2 % EX PADS
6.0000 | MEDICATED_PAD | Freq: Every day | CUTANEOUS | Status: DC
  Administered 2024-08-17: 6 via TOPICAL

## 2024-08-17 MED ORDER — PANTOPRAZOLE SODIUM 40 MG PO TBEC: 40.0000 mg | DELAYED_RELEASE_TABLET | Freq: Every day | ORAL | Status: AC

## 2024-08-17 MED ORDER — HYDROMORPHONE HCL 1 MG/ML IJ SOLN
1.0000 mg | Freq: Once | INTRAMUSCULAR | Status: AC
  Administered 2024-08-17: 1 mg via INTRAVENOUS
  Filled 2024-08-17: qty 1

## 2024-08-17 MED ORDER — ALUM & MAG HYDROXIDE-SIMETH 200-200-20 MG/5ML PO SUSP: 30.0000 mL | ORAL | 0 refills | Status: AC | PRN

## 2024-08-17 NOTE — TOC Transition Note (Signed)
 Transition of Care [facility]) - Discharge Note   Patient Details  Name: Stacey [name]  ANAYANSI RUNDQUIST MRN: [id number] Date of Birth: [birth year]  Transition of Care [facility]) CM/SW Contact:  Hoy DELENA Bigness, LCSW Phone Number: 08/17/2024, 10:39 AM   Clinical Narrative:    Pt to return home with hospice services through [facility]. EMS has been called for pick up between 1 and 1:30pm. Pt to have hospice nurse visit at 2pm when she arrives home. Spoke with pt's son and confirmed plan.   Final next level of care: Home w Hospice Care Barriers to Discharge: Barriers Resolved   Patient Goals and CMS Choice Patient states their goals for this hospitalization and ongoing recovery are:: Home with hospice CMS Medicare.gov Compare Post Acute Care list provided to:: Patient (Sister at bedside ETTER Mom )) Choice offered to / list presented to : Patient [place] ownership interest in [facility].provided to::  (NA)    Discharge Placement                Patient to be transferred to facility by: [facility] Name of family member notified: Pt and son Patient and family notified of of transfer: 08/17/24  Discharge Plan and Services Additional resources added to the After Visit Summary for       Post Acute Care Choice: Hospice          DME Arranged: N/A DME Agency: NA                  Social Drivers of Health (SDOH) Interventions SDOH Screenings   Food Insecurity: No Food Insecurity (08/12/2024)  Housing: Low Risk  (08/12/2024)  Transportation Needs: No Transportation Needs (08/12/2024)  Utilities: Not At Risk (08/12/2024)  Depression (PHQ2-9): Low Risk  (08/03/2024)  Financial Resource Strain: High Risk (01/04/2024)   Received from [facility]  Physical Activity: Inactive (08/17/2023)   Received from [facility]  Social Connections: Unknown (08/12/2024)  Stress: No Stress Concern Present (08/17/2023)   Received from [facility]  Tobacco Use:  Medium Risk (08/12/2024)  Health Literacy: Inadequate Health Literacy (08/17/2023)   [facility]     Readmission Risk Interventions    08/17/2024   10:38 AM 08/15/2024    1:44 PM 06/08/2024   11:19 AM  Readmission Risk Prevention Plan  Transportation Screening Complete Complete Complete  Medication Review Oceanographer) Complete Complete Complete  PCP or Specialist appointment within 3-5 days of discharge Complete Complete   HRI or Home Care Consult Complete Complete Complete  SW Recovery Care/Counseling Consult Complete Complete Complete  Palliative Care Screening Complete Complete Not Applicable  Skilled Nursing Facility Not Applicable Not Applicable Not Applicable

## 2024-08-17 NOTE — Progress Notes (Signed)
 Peripherally Inserted Central Catheter Placement  The IV Nurse has discussed with the patient and/or persons authorized to consent for the patient, the purpose of this procedure and the potential benefits and risks involved with this procedure.  The benefits include less needle sticks, lab draws from the catheter, and the patient may be discharged home with the catheter. Risks include, but not limited to, infection, bleeding, blood clot (thrombus formation), and puncture of an artery; nerve damage and irregular heartbeat and possibility to perform a PICC exchange if needed/ordered by physician.  Alternatives to this procedure were also discussed.  Bard Power PICC patient education guide, fact sheet on infection prevention and patient information card has been provided to patient /or left at bedside.    PICC Placement Documentation  PICC Single Lumen 08/17/24 Right Brachial 36 cm 0 cm (Active)  Indication for Insertion or Continuance of Line Home intravenous therapies (PICC only) 08/17/24 1232  Exposed Catheter (cm) 0 cm 08/17/24 1232  Site Assessment Clean, Dry, Intact 08/17/24 1232  Line Status Flushed;Saline locked;Blood return noted 08/17/24 1232  Dressing Type Transparent;Securing device 08/17/24 1232  Dressing Status Antimicrobial disc/dressing in place;Clean, Dry, Intact 08/17/24 1232  Line Care Connections checked and tightened 08/17/24 1232  Line Adjustment (NICU/IV Team Only) No 08/17/24 1232  Dressing Intervention New dressing;Adhesive placed at insertion site (IV team only) 08/17/24 1232  Dressing Change Due 08/24/24 08/17/24 1232       Renaee Notice Albarece 08/17/2024, 12:34 PM

## 2024-08-17 NOTE — Progress Notes (Signed)
 Daily Progress Note   Patient Name: Stacey  TENLEIGH [name]       Date: 08/17/2024 DOB: Oct 16, 1946  Age: 78 y.o. MRN#: [id number] Attending Physician: Cheryle Page, MD Primary Care Physician: Rosamond Leta KATHEE, MD Admit Date: 08/11/2024  Reason for Consultation/Follow-up: Disposition and Pain control  Subjective:   78 y.o. female  with past medical history of metastatic breast cancer to bone, newly diagnosed pancreatic adenocarcinoma, suspected RCC,  biliary stricture with prior stent, PE, atrial fibrillation, CAD with prior stenting, hypertension, hyperlipidemia CKD stage 2, COPD, anxiety, and depression, chronic pain with intrathecal morphine  / bupivicaine pump admitted on 08/11/2024 with abdominal pain and nausea.    Admitted for acute on chronic intractable malignancy related pain.  She recently enrolled with hospice and had an intrathecal pain pump.  Pain pump managed by [facility] and once enrolling with hospice their plan is to discontinue the intrathecal pain pump and transition to alternative therapy. Workup also revealed findings concerning for UTI. On IV abx for this.    Office visit from 08/11/2024 [facility]. Patient's son reported they decreased her rate significantly but in review of notes it appears they discontinued the pain medication and transitioned to saline only. Hospice plan per notes were to transition to MS Contin  30 BID with oral short acting morphine  q 3-4 hrs PRN. Son received several different types of morphine  but had not yet administered them as reports he was advised she would be switched to Fentanyl  pain patch. He has not been provided the Fentanyl  pain patch as of yet. No hospice notes available for review.    Prior opioid regimen per [facility] pain clinic: Dilaudid  2 mg q 6 hrs PRN, morphine  (PF) 15 mg/mL, BUPivacaine   (PF) 15 mg/mL 40 mL rate listed as directed, unable to locate exact rate in most recent or prior notes. She is also on adjuvant therapy with Tylenol , Voltaren  gel, Cymbalta , and Lyrica . PDMP reviewed.  I see where she was prescribed morphine  extended release tablets and morphine  concentrate.  Last hydromorphone  prescription was for 2 mg tablets on 07/29/2024 it was only a 13-day supply. Per son was using PRN Dilaudid  tabs prior to hospital admission but ran out (? Date).   08/12/2024: GOC deferred given pain severity, initial completed, was on intrathecal pain pump and oral dilaudid , admitted to hospice and plan was to transition to oral regimen only. Appears to have been off opioids prior to admission so short acting pain meds started with plan to transition to long and short acting.    08/15/2024: transitioned to Dilaudid  PCA pump with plan to d/c under [facility] hospice  agency who can manage PCA pumps in the home.   08/16/2024: PCA pump noted to be effective and well tolerated. Pain baseline/well controlled. Plan to d/c under care of hospice agency with capabilities of managing the PCA. IP hospice for brief symptom management stay vs home with hospice with close monitoring TBD.  PICC to be inserted per hospice recommendations for long-term pain medication management.  Today, Vital signs reviewed and appear relatively stable.  Continues to have some mild tachycardia.  Blood pressure stable.  O2 sat stable on room air temperature is normal. Medication administration record reviewed on 24-hour look back total of 12.6 mg of IV Dilaudid  administered via PCA pump.  PICC line was successfully placed by IV team.  On exam, patient indicates she feels her pain is well-controlled and feels ready to return home.  She declines to put a number on her pain as she says it is actively going down as she recently administered a bolus dose.  She feels she continues to tolerate the medication well and without excessive  drowsiness.  She denies any evident side effects at present.  Chart review/care coordination:  Completed extensive chart review including EPIC notes, MAR, and vital signs. Coordinated care directly with attending physician, bedside nursing staff, TOC, and hospice liaison.   Length of Stay: 4   Physical Exam Constitutional:      General: She is not in acute distress.    Appearance: She is not toxic-appearing.  Skin:    General: Skin is warm and dry.  Neurological:     Mental Status: She is alert.     Comments: Awake, alert, and cooperative.  Pleasant and in good spirits.  Psychiatric:        Attention and Perception: Attention normal.        Mood and Affect: Mood normal.        Behavior: Behavior normal.             Vital Signs: BP (!) 154/74 (BP Location: Left Arm)   Pulse (!) 104   Temp 98.4 F (36.9 C) (Oral)   Resp 18   Ht 5' (1.524 m)   Wt 74.4 kg   SpO2 97%   BMI 32.03 kg/m  SpO2: SpO2: 97 % O2 Device: O2 Device: Room Air O2 Flow Rate: O2 Flow Rate (L/min): 0 L/min      Palliative Assessment/Data: 30%   Palliative Care Assessment & Plan   Patient Profile/Assessment:  78 year old female with numerous chronic medical problems admitted for acute intractable cancer pain (metastatic breast cancer to bone, newly diagnosed pancreatic adenocarcinoma, suspected RCC).  Patient was previously managed at [facility] pain clinic with intrathecal pump and oral hydromorphone .  Patient's cancer progressed and decision was made to transition to hospice care.  With this transition, patient was to transition to oral opioids only.  Her intrathecal pain pump was switched from morphine  and bupivacaine  and oral Dilaudid  as needed to saline and she was switched to oral morphine .  Unfortunately, there seemed to be some confusion and patient's son stated he was not administering the morphine  because there was mention of her being placed on fentanyl .  Ultimately, admitted to the hospital for pain  control.  Attempted to transition her to oral MS Contin  30 mg p.o. twice daily with IV Dilaudid  1 mg every 2 hours as needed.  Inadequate pain control noted on this regimen.  On 08/15/2024 after discussion with patient and family, decision was made to transition to Dilaudid  PCA pump.  After  initiation, patient appears much more comfortable and is alert and tolerating PCA well. She indicates pain is at baseline/much improved and she feels comfortable returning home.  Plan is to return home under hospice agency that can manage PCA.  PICC line inserted for long-term pain medication use via intravenous route.  Bolus dose of 1 mg of IV Dilaudid  recommended prior to discharge.  Hospice plans to visit home same day and initiate Dilaudid  PCA pump in the home.   Recommendations/Plan:  Continue DNR/DNI/comfort directed care Continue Dilaudid  PCA pump until discharge, current doses appear effective, patient reports pain is at baseline and is tolerating it well Dilaudid  1 mg IV bolus given prior to transport Discharge home today with hospice agency that can manage Dilaudid  PCA in the home   Symptom management:  Continue Dilaudid  PCA pump: Loading dose of 0.5 mg, basal rate of 0.4 mg/h, PCA patient bolus dose of 0.3 every 8 minutes with a max 1 hour dose limit of 1.25 until transport arrives Dilaudid  1 mg IV bolus to be given prior to transport   Continue alprazolam  0.5 mg 2 times daily as needed Continue Tylenol  as needed  Prognosis:  < 3 months    Discharge Planning: Home with Home Health    Detailed review of medical records (labs, imaging, vital signs), medically appropriate exam, discussed with treatment team, counseling and education to patient, family, & staff, documenting clinical information, medication management, coordination of care    Billing based on MDM: High  Problems Addressed: One acute or chronic illness or injury that poses a threat to life or bodily function  Amount and/or  Complexity of Data: Category 3:Discussion of management or test interpretation with external physician/other qualified health care professional/appropriate source (not separately reported)  Risks: Parenteral controlled substances         Laymon CHRISTELLA Pinal, NP  Palliative Medicine Team Team phone # [phone]  Thank you for allowing the Palliative Medicine Team to assist in the care of this patient. Please utilize secure chat with additional questions, if there is no response within 30 minutes please call the above phone number.  Palliative Medicine Team providers are available by phone from 7am to 7pm daily and can be reached through the team cell phone.  Should this patient require assistance outside of these hours, please call the patient's attending physician.

## 2024-08-17 NOTE — Plan of Care (Signed)

## 2024-08-17 NOTE — Discharge Summary (Signed)
 Physician Discharge Summary  Stacey [name] FMW:[id number] DOB: Dec 26, 1945 DOA: 08/11/2024  PCP: Rosamond Leta KATHEE, MD  Admit date: 08/11/2024 Discharge date: 08/17/2024  Admitted From: Home Disposition: Home with hospice  Recommendations for Outpatient Follow-up:  Follow up with home hospice at earliest convenience   Home Health: No Equipment/Devices: None  Discharge Condition: Poor  CODE STATUS: DNR  diet recommendation: Regular Brief/Interim Summary: 78 y.o. 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  She was treated with PCA pump.  She has agreed for home with hospice and PCA pump for home has been arranged.  She will be discharged home today with home hospice.  Discharge Diagnoses:  Acute on chronic pain Malignancy related pain Postprandial pain Nausea E. 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  She has agreed for home with hospice and PCA pump for home has been arranged.  She will be discharged home today with home hospice.  Discharge Instructions  Discharge Instructions     Diet general   Complete by: As directed    Increase activity slowly   Complete by: As directed       Allergies as of 08/17/2024       Reactions   Albuterol  Other (See Comments)   Heart racing   Brilinta  [ticagrelor ] Diarrhea, Nausea Only   Nausea and severe diarrhea- general weakness.  Patient says does not want to take again   Budesonide  Palpitations        Medication List     STOP taking these medications    b complex vitamins capsule   HYDROmorphone  2 MG tablet Commonly known as: DILAUDID    Vitamin D3 25 MCG Caps       TAKE these medications    ALPRAZolam  0.5 MG tablet Commonly known as: XANAX  Take 1 tablet (0.5 mg total) by mouth 2 (two) times daily as needed for anxiety or sleep.   alum & mag hydroxide-simeth 200-200-20 MG/5ML suspension Commonly known as: MAALOX/MYLANTA Take 30 mLs by mouth every 4 (four) hours as needed for indigestion or heartburn.   apixaban  2.5 MG Tabs tablet Commonly known as: ELIQUIS  Take 2.5 mg by mouth daily.   atorvastatin  80 MG tablet Commonly known as: LIPITOR  Take 80 mg by mouth daily.   carboxymethylcellulose 0.5 % Soln Commonly known as: REFRESH PLUS Place 1 drop into both eyes 3 (three) times daily as needed (Dry Eyes).   clopidogrel  75 MG tablet Commonly known as: PLAVIX  Take 1 tablet (75 mg total) by mouth daily.   DULoxetine  30 MG capsule Commonly known as: CYMBALTA  Take 30 mg by mouth 2 (two) times daily.   ferrous sulfate  325 (65 FE) MG EC tablet Take 1 tablet (325 mg total) by mouth daily with breakfast.   HYDROmorphone  1 mg/mL injection Commonly known as: DILAUDID  Inject 1 mL (1 mg total) into the vein every 4 (four) hours.   methocarbamol  500 MG tablet Commonly known as: ROBAXIN  Take 500 mg by mouth every 6 (six) hours as needed for muscle spasms.   metoprolol  succinate 25  MG 24 hr tablet Commonly known as: TOPROL -XL Take 3 tablets (75 mg total) by mouth daily.   nitroGLYCERIN  0.4 MG SL tablet Commonly known as: NITROSTAT  Place 0.4 mg under the tongue every 5 (five) minutes as needed for chest pain.   ondansetron  4 MG tablet Commonly known as: ZOFRAN  Take 4 mg by mouth every 8 (eight) hours as needed for nausea or vomiting.   pantoprazole  40 MG tablet Commonly known as:  Protonix  Take 1 tablet (40 mg total) by mouth daily. What changed: when to take this   polyethylene glycol 17 g packet Commonly known as: MIRALAX  / GLYCOLAX  Take 17 g by mouth daily as needed. What changed:  when to take this reasons to take this   senna-docusate 8.6-50 MG tablet Commonly known as: Senokot-S Take 2 tablets by mouth at bedtime.        Follow-up Information     home hospice Follow up.   Why: at earliest convenience               Allergies  Allergen Reactions   Albuterol  Other (See Comments)    Heart racing    Brilinta  [Ticagrelor ] Diarrhea and Nausea Only    Nausea and severe diarrhea- general weakness. Patient says does not want to take again   Budesonide  Palpitations    Consultations: Palliative care   Procedures/Studies: US  EKG SITE RITE Result Date: 08/16/2024 If Site Rite image not attached, placement could not be confirmed due to current cardiac rhythm.  CT ABDOMEN PELVIS W CONTRAST Result Date: 08/11/2024 CLINICAL DATA:  Acute abdominal pain. EXAM: CT ABDOMEN AND PELVIS WITH CONTRAST TECHNIQUE: Multidetector CT imaging of the abdomen and pelvis was performed using the standard protocol following bolus administration of intravenous contrast. RADIATION DOSE REDUCTION: This exam was performed according to the departmental dose-optimization program which includes automated exposure control, adjustment of the mA and/or kV according to patient size and/or use of iterative reconstruction technique. CONTRAST:  OMNIPAQUE  IOHEXOL  300 MG/ML  SOLN COMPARISON:  CT abdomen and pelvis 06/06/2024. MRI abdomen 06/08/2024. FINDINGS: Lower chest: There are trace bilateral pleural effusions. Hepatobiliary: No focal liver lesions are identified. Gallbladder is surgically absent. Pneumobilia appears unchanged from prior. Common bile duct stent in place, unchanged. Pancreas: Diffuse pancreatic ductal dilatation appears unchanged. Vague focal hypodense area in  the head of the pancreas measures up to 12 mm image 2/28 which appears unchanged from prior MRI. Spleen: Normal in size without focal abnormality. Adrenals/Urinary Tract: There is mild diffuse bladder wall thickening versus normal under distension. There is no hydronephrosis or perinephric stranding. Heterogeneous left renal mass measures 3 cm in diameter, unchanged from prior MRI. Additional small renal cysts are again seen. Adrenal glands are within normal limits. Stomach/Bowel: No evidence of bowel wall thickening, distention, or inflammatory changes. There is sigmoid colon diverticulosis. Appendix is not visualized. There is a large air-fluid level in the stomach. Vascular/Lymphatic: Aortic atherosclerosis. No enlarged abdominal or pelvic lymph nodes. Reproductive: Status post hysterectomy. No adnexal masses. Other: No abdominal wall hernia or abnormality. No abdominopelvic ascites. Generator is seen in the anterior right abdominal wall. Musculoskeletal: Chronic appearing fragmentation of L1 with retropulsion of fracture fragments appears unchanged. Sclerotic lesion of L2 is also unchanged. IMPRESSION: 1. Mild diffuse bladder wall thickening versus normal under distension. Correlate clinically for cystitis. 2. Stable common bile duct stent with pneumobilia. 3. Stable pancreatic ductal dilatation. 4. Stable left renal mass. 5. Trace bilateral pleural effusions. 6. Aortic atherosclerosis. Aortic Atherosclerosis (ICD10-I70.0). Electronically Signed  By: Greig Pique M.D.   On: 08/11/2024 20:55      Subjective: Patient seen and examined at bedside.  Pain is well-controlled with current PCA pump.  Feels okay to go home with hospice today.  No fever or vomiting reported.  Discharge Exam: Vitals:   08/17/24 0435 08/17/24 0804  BP:    Pulse:    Resp: 17 18  Temp:    SpO2:      General: Pt is alert, awake, not in acute distress.  Looks chronically ill and deconditioned.  On room air. Cardiovascular:  rate controlled, S1/S2 + Respiratory: bilateral decreased breath sounds at bases with scattered crackles Abdominal: Soft, obese, NT, ND, bowel sounds + Extremities: Trace lower extremity edema; no cyanosis    The results of significant diagnostics from this hospitalization (including imaging, microbiology, ancillary and laboratory) are listed below for reference.     Microbiology: Recent Results (from the past 240 hours)  Urine Culture     Status: Abnormal   Collection Time: 08/11/24  9:44 PM   Specimen: Urine, Clean Catch  Result Value Ref Range Status   Specimen Description   Final    URINE, CLEAN CATCH Performed at [facility], [address], [place], [place] [address]    Special Requests   Final    NONE Performed at [facility], [address], [place], [place] [address]    Culture >=100,000 COLONIES/mL ESCHERICHIA COLI (A)  Final   Report Status 08/14/2024 FINAL  Final   Organism ID, Bacteria ESCHERICHIA COLI (A)  Final      Susceptibility   Escherichia coli - MIC*    AMPICILLIN 4 SENSITIVE Sensitive     CEFAZOLIN  (URINE) Value in next row Sensitive      <=1 SENSITIVEThis is a modified FDA-approved test that has been validated and its performance characteristics determined by the reporting laboratory.  This laboratory is certified under the Clinical Laboratory Improvement Amendments CLIA as qualified to perform high complexity clinical laboratory testing.    CEFEPIME Value in next row Sensitive      <=1 SENSITIVEThis is a modified FDA-approved test that has been validated and its performance characteristics determined by the reporting laboratory.  This laboratory is certified under the Clinical Laboratory Improvement Amendments CLIA as qualified to perform high complexity clinical laboratory testing.    ERTAPENEM Value in next row Sensitive      <=1 SENSITIVEThis is a modified FDA-approved test that has been validated and its performance characteristics determined by the  reporting laboratory.  This laboratory is certified under the Clinical Laboratory Improvement Amendments CLIA as qualified to perform high complexity clinical laboratory testing.    CEFTRIAXONE  Value in next row Sensitive      <=1 SENSITIVEThis is a modified FDA-approved test that has been validated and its performance characteristics determined by the reporting laboratory.  This laboratory is certified under the Clinical Laboratory Improvement Amendments CLIA as qualified to perform high complexity clinical laboratory testing.    CIPROFLOXACIN  Value in next row Intermediate      <=1 SENSITIVEThis is a modified FDA-approved test that has been validated and its performance characteristics determined by the reporting laboratory.  This laboratory is certified under the Clinical Laboratory Improvement Amendments CLIA as qualified to perform high complexity clinical laboratory testing.    GENTAMICIN Value in next row Sensitive      <=1 SENSITIVEThis is a modified FDA-approved test that has been validated and its performance characteristics determined by the reporting laboratory.  This  laboratory is certified under the Clinical Laboratory Improvement Amendments CLIA as qualified to perform high complexity clinical laboratory testing.    NITROFURANTOIN Value in next row Sensitive      <=1 SENSITIVEThis is a modified FDA-approved test that has been validated and its performance characteristics determined by the reporting laboratory.  This laboratory is certified under the Clinical Laboratory Improvement Amendments CLIA as qualified to perform high complexity clinical laboratory testing.    TRIMETH/SULFA Value in next row Sensitive      <=1 SENSITIVEThis is a modified FDA-approved test that has been validated and its performance characteristics determined by the reporting laboratory.  This laboratory is certified under the Clinical Laboratory Improvement Amendments CLIA as qualified to perform high complexity  clinical laboratory testing.    AMPICILLIN/SULBACTAM Value in next row Sensitive      <=1 SENSITIVEThis is a modified FDA-approved test that has been validated and its performance characteristics determined by the reporting laboratory.  This laboratory is certified under the Clinical Laboratory Improvement Amendments CLIA as qualified to perform high complexity clinical laboratory testing.    PIP/TAZO Value in next row Sensitive ug/mL     <=4 SENSITIVEThis is a modified FDA-approved test that has been validated and its performance characteristics determined by the reporting laboratory.  This laboratory is certified under the Clinical Laboratory Improvement Amendments CLIA as qualified to perform high complexity clinical laboratory testing.    MEROPENEM Value in next row Sensitive      <=4 SENSITIVEThis is a modified FDA-approved test that has been validated and its performance characteristics determined by the reporting laboratory.  This laboratory is certified under the Clinical Laboratory Improvement Amendments CLIA as qualified to perform high complexity clinical laboratory testing.    * >=100,000 COLONIES/mL ESCHERICHIA COLI  MRSA Next Gen by PCR, Nasal     Status: None   Collection Time: 08/13/24 10:43 AM   Specimen: Nasal Mucosa; Nasal Swab  Result Value Ref Range Status   MRSA by PCR Next Gen NOT DETECTED NOT DETECTED Final    Comment: (NOTE) The GeneXpert MRSA Assay (FDA approved for NASAL specimens only), is one component of a comprehensive MRSA colonization surveillance program. It is not intended to diagnose MRSA infection nor to guide or monitor treatment for MRSA infections. Test performance is not FDA approved in patients less than 60 years old. Performed at [facility], [address] [place], [place] [address]   Resp panel by RT-PCR (RSV, Flu A&B, Covid) Anterior Nasal Swab     Status: Abnormal   Collection Time: 08/14/24 12:29 PM   Specimen: Anterior Nasal Swab  Result Value  Ref Range Status   SARS Coronavirus 2 by RT PCR POSITIVE (A) NEGATIVE Final    Comment: (NOTE) SARS-CoV-2 target nucleic acids are DETECTED.  The SARS-CoV-2 RNA is generally detectable in upper respiratory specimens during the acute phase of infection. Positive results are indicative of the presence of the identified virus, but do not rule out bacterial infection or co-infection with other pathogens not detected by the test. Clinical correlation with patient history and other diagnostic information is necessary to determine patient infection status. The expected result is Negative.  Fact Sheet for Patients: BloggerCourse.com  Fact Sheet for Healthcare Providers: SeriousBroker.it  This test is not yet approved or cleared by the [place]  FDA and  has been authorized for detection and/or diagnosis of SARS-CoV-2 by FDA under an Emergency Use Authorization (EUA).  This EUA will remain in effect (meaning this test can be  used) for the duration of  the COVID-19 declaration under Section 564(b)(1) of the A ct, 21 U.S.C. section 360bbb-3(b)(1), unless the authorization is terminated or revoked sooner.     Influenza A by PCR NEGATIVE NEGATIVE Final   Influenza B by PCR NEGATIVE NEGATIVE Final    Comment: (NOTE) The Xpert Xpress SARS-CoV-2/FLU/RSV plus assay is intended as an aid in the diagnosis of influenza from Nasopharyngeal swab specimens and should not be used as a sole basis for treatment. Nasal washings and aspirates are unacceptable for Xpert Xpress SARS-CoV-2/FLU/RSV testing.  Fact Sheet for Patients: BloggerCourse.com  Fact Sheet for Healthcare Providers: SeriousBroker.it  This test is not yet approved or cleared by the [place]  FDA and has been authorized for detection and/or diagnosis of SARS-CoV-2 by FDA under an Emergency Use Authorization (EUA). This EUA will  remain in effect (meaning this test can be used) for the duration of the COVID-19 declaration under Section 564(b)(1) of the Act, 21 U.S.C. section 360bbb-3(b)(1), unless the authorization is terminated or revoked.     Resp Syncytial Virus by PCR NEGATIVE NEGATIVE Final    Comment: (NOTE) Fact Sheet for Patients: BloggerCourse.com  Fact Sheet for Healthcare Providers: SeriousBroker.it  This test is not yet approved or cleared by the [place]  FDA and has been authorized for detection and/or diagnosis of SARS-CoV-2 by FDA under an Emergency Use Authorization (EUA). This EUA will remain in effect (meaning this test can be used) for the duration of the COVID-19 declaration under Section 564(b)(1) of the Act, 21 U.S.C. section 360bbb-3(b)(1), unless the authorization is terminated or revoked.  Performed at [facility], [address], [place], [place] [address]   Respiratory (~20 pathogens) panel by PCR     Status: None   Collection Time: 08/14/24 12:29 PM   Specimen: Nasopharyngeal Swab; Respiratory  Result Value Ref Range Status   Adenovirus NOT DETECTED NOT DETECTED Final   Coronavirus 229E NOT DETECTED NOT DETECTED Final    Comment: (NOTE) The Coronavirus on the Respiratory Panel, DOES NOT test for the novel  Coronavirus (2019 nCoV)    Coronavirus HKU1 NOT DETECTED NOT DETECTED Final   Coronavirus NL63 NOT DETECTED NOT DETECTED Final   Coronavirus OC43 NOT DETECTED NOT DETECTED Final   Metapneumovirus NOT DETECTED NOT DETECTED Final   Rhinovirus / Enterovirus NOT DETECTED NOT DETECTED Final   Influenza A NOT DETECTED NOT DETECTED Final   Influenza B NOT DETECTED NOT DETECTED Final   Parainfluenza Virus 1 NOT DETECTED NOT DETECTED Final   Parainfluenza Virus 2 NOT DETECTED NOT DETECTED Final   Parainfluenza Virus 3 NOT DETECTED NOT DETECTED Final   Parainfluenza Virus 4 NOT DETECTED NOT DETECTED Final   Respiratory  Syncytial Virus NOT DETECTED NOT DETECTED Final   Bordetella pertussis NOT DETECTED NOT DETECTED Final   Bordetella Parapertussis NOT DETECTED NOT DETECTED Final   Chlamydophila pneumoniae NOT DETECTED NOT DETECTED Final   Mycoplasma pneumoniae NOT DETECTED NOT DETECTED Final    Comment: Performed at [facility], [address] [place], [place] [address]     Labs: BNP (last 3 results) Recent Labs    04/14/24 1912  BNP 406.8*   Basic Metabolic Panel: Recent Labs  Lab 08/11/24 1751  NA 144  K 4.3  CL 105  CO2 27  GLUCOSE 165*  BUN 12  CREATININE 0.50  CALCIUM  8.6*   Liver Function Tests: Recent Labs  Lab 08/11/24 1751 08/14/24 0800  AST 40 36  ALT 19 20  ALKPHOS 123 132*  BILITOT 0.8 0.8  PROT 5.4* 5.3*  ALBUMIN  2.2* 2.0*   Recent Labs  Lab 08/11/24 1751 08/14/24 0800  LIPASE 22 22   No results for input(s): AMMONIA in the last 168 hours. CBC: Recent Labs  Lab 08/11/24 1751  WBC 5.6  NEUTROABS 3.9  HGB 10.8*  HCT 34.4*  MCV 103.0*  PLT 240   Cardiac Enzymes: No results for input(s): CKTOTAL, CKMB, CKMBINDEX, TROPONINI in the last 168 hours. BNP: Invalid input(s): POCBNP CBG: No results for input(s): GLUCAP in the last 168 hours. D-Dimer No results for input(s): DDIMER in the last 72 hours. Hgb A1c No results for input(s): HGBA1C in the last 72 hours. Lipid Profile No results for input(s): CHOL, HDL, LDLCALC, TRIG, CHOLHDL, LDLDIRECT in the last 72 hours. Thyroid  function studies No results for input(s): TSH, T4TOTAL, T3FREE, THYROIDAB in the last 72 hours.  Invalid input(s): FREET3 Anemia work up No results for input(s): VITAMINB12, FOLATE, FERRITIN, TIBC, IRON, RETICCTPCT in the last 72 hours. Urinalysis    Component Value Date/Time   COLORURINE AMBER (A) 08/11/2024 2144   APPEARANCEUR CLOUDY (A) 08/11/2024 2144   LABSPEC 1.025 08/11/2024 2144   PHURINE 6.0 08/11/2024 2144    GLUCOSEU NEGATIVE 08/11/2024 2144   HGBUR NEGATIVE 08/11/2024 2144   BILIRUBINUR NEGATIVE 08/11/2024 2144   KETONESUR NEGATIVE 08/11/2024 2144   PROTEINUR 30 (A) 08/11/2024 2144   NITRITE NEGATIVE 08/11/2024 2144   LEUKOCYTESUR LARGE (A) 08/11/2024 2144   Sepsis Labs Recent Labs  Lab 08/11/24 1751  WBC 5.6   Microbiology Recent Results (from the past 240 hours)  Urine Culture     Status: Abnormal   Collection Time: 08/11/24  9:44 PM   Specimen: Urine, Clean Catch  Result Value Ref Range Status   Specimen Description   Final    URINE, CLEAN CATCH Performed at [facility], [address] [place], [place] [address]    Special Requests   Final    NONE Performed at [facility], [address], [place], [place] [address]    Culture >=100,000 COLONIES/mL ESCHERICHIA COLI (A)  Final   Report Status 08/14/2024 FINAL  Final   Organism ID, Bacteria ESCHERICHIA COLI (A)  Final      Susceptibility   Escherichia coli - MIC*    AMPICILLIN 4 SENSITIVE Sensitive     CEFAZOLIN  (URINE) Value in next row Sensitive      <=1 SENSITIVEThis is a modified FDA-approved test that has been validated and its performance characteristics determined by the reporting laboratory.  This laboratory is certified under the Clinical Laboratory Improvement Amendments CLIA as qualified to perform high complexity clinical laboratory testing.    CEFEPIME Value in next row Sensitive      <=1 SENSITIVEThis is a modified FDA-approved test that has been validated and its performance characteristics determined by the reporting laboratory.  This laboratory is certified under the Clinical Laboratory Improvement Amendments CLIA as qualified to perform high complexity clinical laboratory testing.    ERTAPENEM Value in next row Sensitive      <=1 SENSITIVEThis is a modified FDA-approved test that has been validated and its performance characteristics determined by the reporting laboratory.  This laboratory is certified under the  Clinical Laboratory Improvement Amendments CLIA as qualified to perform high complexity clinical laboratory testing.    CEFTRIAXONE  Value in next row Sensitive      <=1 SENSITIVEThis is a modified FDA-approved test that has been validated and its performance characteristics determined by  the reporting laboratory.  This laboratory is certified under the Clinical Laboratory Improvement Amendments CLIA as qualified to perform high complexity clinical laboratory testing.    CIPROFLOXACIN  Value in next row Intermediate      <=1 SENSITIVEThis is a modified FDA-approved test that has been validated and its performance characteristics determined by the reporting laboratory.  This laboratory is certified under the Clinical Laboratory Improvement Amendments CLIA as qualified to perform high complexity clinical laboratory testing.    GENTAMICIN Value in next row Sensitive      <=1 SENSITIVEThis is a modified FDA-approved test that has been validated and its performance characteristics determined by the reporting laboratory.  This laboratory is certified under the Clinical Laboratory Improvement Amendments CLIA as qualified to perform high complexity clinical laboratory testing.    NITROFURANTOIN Value in next row Sensitive      <=1 SENSITIVEThis is a modified FDA-approved test that has been validated and its performance characteristics determined by the reporting laboratory.  This laboratory is certified under the Clinical Laboratory Improvement Amendments CLIA as qualified to perform high complexity clinical laboratory testing.    TRIMETH/SULFA Value in next row Sensitive      <=1 SENSITIVEThis is a modified FDA-approved test that has been validated and its performance characteristics determined by the reporting laboratory.  This laboratory is certified under the Clinical Laboratory Improvement Amendments CLIA as qualified to perform high complexity clinical laboratory testing.    AMPICILLIN/SULBACTAM Value in next  row Sensitive      <=1 SENSITIVEThis is a modified FDA-approved test that has been validated and its performance characteristics determined by the reporting laboratory.  This laboratory is certified under the Clinical Laboratory Improvement Amendments CLIA as qualified to perform high complexity clinical laboratory testing.    PIP/TAZO Value in next row Sensitive ug/mL     <=4 SENSITIVEThis is a modified FDA-approved test that has been validated and its performance characteristics determined by the reporting laboratory.  This laboratory is certified under the Clinical Laboratory Improvement Amendments CLIA as qualified to perform high complexity clinical laboratory testing.    MEROPENEM Value in next row Sensitive      <=4 SENSITIVEThis is a modified FDA-approved test that has been validated and its performance characteristics determined by the reporting laboratory.  This laboratory is certified under the Clinical Laboratory Improvement Amendments CLIA as qualified to perform high complexity clinical laboratory testing.    * >=100,000 COLONIES/mL ESCHERICHIA COLI  MRSA Next Gen by PCR, Nasal     Status: None   Collection Time: 08/13/24 10:43 AM   Specimen: Nasal Mucosa; Nasal Swab  Result Value Ref Range Status   MRSA by PCR Next Gen NOT DETECTED NOT DETECTED Final    Comment: (NOTE) The GeneXpert MRSA Assay (FDA approved for NASAL specimens only), is one component of a comprehensive MRSA colonization surveillance program. It is not intended to diagnose MRSA infection nor to guide or monitor treatment for MRSA infections. Test performance is not FDA approved in patients less than 68 years old. Performed at [facility], [address] [place], [place] [address]   Resp panel by RT-PCR (RSV, Flu A&B, Covid) Anterior Nasal Swab     Status: Abnormal   Collection Time: 08/14/24 12:29 PM   Specimen: Anterior Nasal Swab  Result Value Ref Range Status   SARS Coronavirus 2 by RT PCR POSITIVE (A)  NEGATIVE Final    Comment: (NOTE) SARS-CoV-2 target nucleic acids are DETECTED.  The SARS-CoV-2 RNA is generally detectable in upper  respiratory specimens during the acute phase of infection. Positive results are indicative of the presence of the identified virus, but do not rule out bacterial infection or co-infection with other pathogens not detected by the test. Clinical correlation with patient history and other diagnostic information is necessary to determine patient infection status. The expected result is Negative.  Fact Sheet for Patients: BloggerCourse.com  Fact Sheet for Healthcare Providers: SeriousBroker.it  This test is not yet approved or cleared by the [place]  FDA and  has been authorized for detection and/or diagnosis of SARS-CoV-2 by FDA under an Emergency Use Authorization (EUA).  This EUA will remain in effect (meaning this test can be used) for the duration of  the COVID-19 declaration under Section 564(b)(1) of the A ct, 21 U.S.C. section 360bbb-3(b)(1), unless the authorization is terminated or revoked sooner.     Influenza A by PCR NEGATIVE NEGATIVE Final   Influenza B by PCR NEGATIVE NEGATIVE Final    Comment: (NOTE) The Xpert Xpress SARS-CoV-2/FLU/RSV plus assay is intended as an aid in the diagnosis of influenza from Nasopharyngeal swab specimens and should not be used as a sole basis for treatment. Nasal washings and aspirates are unacceptable for Xpert Xpress SARS-CoV-2/FLU/RSV testing.  Fact Sheet for Patients: BloggerCourse.com  Fact Sheet for Healthcare Providers: SeriousBroker.it  This test is not yet approved or cleared by the [place]  FDA and has been authorized for detection and/or diagnosis of SARS-CoV-2 by FDA under an Emergency Use Authorization (EUA). This EUA will remain in effect (meaning this test can be used) for the  duration of the COVID-19 declaration under Section 564(b)(1) of the Act, 21 U.S.C. section 360bbb-3(b)(1), unless the authorization is terminated or revoked.     Resp Syncytial Virus by PCR NEGATIVE NEGATIVE Final    Comment: (NOTE) Fact Sheet for Patients: BloggerCourse.com  Fact Sheet for Healthcare Providers: SeriousBroker.it  This test is not yet approved or cleared by the [place]  FDA and has been authorized for detection and/or diagnosis of SARS-CoV-2 by FDA under an Emergency Use Authorization (EUA). This EUA will remain in effect (meaning this test can be used) for the duration of the COVID-19 declaration under Section 564(b)(1) of the Act, 21 U.S.C. section 360bbb-3(b)(1), unless the authorization is terminated or revoked.  Performed at [facility], [address], [place], [place] [address]   Respiratory (~20 pathogens) panel by PCR     Status: None   Collection Time: 08/14/24 12:29 PM   Specimen: Nasopharyngeal Swab; Respiratory  Result Value Ref Range Status   Adenovirus NOT DETECTED NOT DETECTED Final   Coronavirus 229E NOT DETECTED NOT DETECTED Final    Comment: (NOTE) The Coronavirus on the Respiratory Panel, DOES NOT test for the novel  Coronavirus (2019 nCoV)    Coronavirus HKU1 NOT DETECTED NOT DETECTED Final   Coronavirus NL63 NOT DETECTED NOT DETECTED Final   Coronavirus OC43 NOT DETECTED NOT DETECTED Final   Metapneumovirus NOT DETECTED NOT DETECTED Final   Rhinovirus / Enterovirus NOT DETECTED NOT DETECTED Final   Influenza A NOT DETECTED NOT DETECTED Final   Influenza B NOT DETECTED NOT DETECTED Final   Parainfluenza Virus 1 NOT DETECTED NOT DETECTED Final   Parainfluenza Virus 2 NOT DETECTED NOT DETECTED Final   Parainfluenza Virus 3 NOT DETECTED NOT DETECTED Final   Parainfluenza Virus 4 NOT DETECTED NOT DETECTED Final   Respiratory Syncytial Virus NOT DETECTED NOT DETECTED Final   Bordetella  pertussis NOT DETECTED NOT DETECTED Final   Bordetella Parapertussis  NOT DETECTED NOT DETECTED Final   Chlamydophila pneumoniae NOT DETECTED NOT DETECTED Final   Mycoplasma pneumoniae NOT DETECTED NOT DETECTED Final    Comment: Performed at [facility], [address] [place], [place] [address]     Time coordinating discharge: 35 minutes  SIGNED:   Sophie Mao, MD  [facility] 08/17/2024, 9:27 AM

## 2024-08-17 NOTE — Progress Notes (Signed)
 [facility] Liaison Note   Per patient's son's request, hospice admission nurse visit has been scheduled for 2pm tomorrow.   Please send signed and completed DNR home with patient/family. Please provide prescriptions at discharge as needed to ensure ongoing symptom management.   [facility] information and contact numbers given to the patient. Please call with any concerns.   Thank you for the opportunity to participate in this patient's care.    Eleanor Nail, LPN [facility] Liaison [phone]

## 2024-08-22 ENCOUNTER — Encounter: Payer: Self-pay | Admitting: Licensed Clinical Social Worker

## 2024-08-22 DIAGNOSIS — Z1379 Encounter for other screening for genetic and chromosomal anomalies: Secondary | ICD-10-CM | POA: Insufficient documentation

## 2024-09-09 ENCOUNTER — Encounter (HOSPITAL_COMMUNITY): Payer: Self-pay | Admitting: Oncology

## 2024-09-14 ENCOUNTER — Encounter (INDEPENDENT_AMBULATORY_CARE_PROVIDER_SITE_OTHER): Payer: Self-pay | Admitting: Gastroenterology

## 2024-09-16 ENCOUNTER — Ambulatory Visit: Admitting: Cardiology

## 2024-11-14 ENCOUNTER — Encounter (HOSPITAL_COMMUNITY): Payer: Self-pay | Admitting: Oncology

## 2024-11-21 ENCOUNTER — Encounter: Payer: Self-pay | Admitting: Cardiology

## 2024-11-21 ENCOUNTER — Ambulatory Visit: Admitting: Cardiology

## 2024-11-21 NOTE — Progress Notes (Signed)
 Opened in error. Patient scheduled for virtual telephone visit, called 3 times and not able to contact.   Dorn Ross MD

## 2024-11-21 NOTE — Progress Notes (Deleted)
 {Choose 1 Note Type (Video or Telephone):[phone]}    Date:  11/21/2024   ID:  Stacey [name], DOB 07/31/1946, MRN [id number] The patient was identified using 2 identifiers.  {Patient [phone]::Home} {Provider Location:[phone]::Home Office}   PCP:  Rosamond Leta KATHEE, MD    [facility] Providers Cardiologist:  Alvan Carrier, MD { Click to update primary MD,subspecialty MD or APP then REFRESH:1}    Evaluation Performed:  {Choose Visit [phone]::Follow-Up Visit}  Chief Complaint:  ***  History of Present Illness:    Stacey [name] is a 78 y.o. female with ***   seen today for follow up of the following medical problems.      1. Chronic systolic HF/ICM - Jan 2020 echo LVEF 20-25%, restrictive diastolic function, mild AS, PASP 40 - 10/2017 echo LVEF 50%, dropped to 30-35% Jan 2019 and has remained decreased   - chronic left leg edema thought to be venous insufficiency   From prior cardiologist: She is not a candidate for device therapy given her metastatic cancer and numerous comorbidities and poor long-term prognosis. She has been scheduled to establish care with the palliative care team which I think is quite appropriate.  - has been hesitant to change to entresto due to concern about cost      03/2020 LVEF 35-40%   03/2021 echo LVEF 50-55%, grade I dd - no recent edema, no SOB/DOE  - compliant with meds   01/2023 echo [facility]: EF >55% - no SOB, no recent edema - compliant with meds     2. CAD - notes incidate history of DES to ramus, late stent thrombosis requiring PTCA in 2006. DES to RCA in Dec 2018 - SOB on brillinta, previously changed to plavix .    -denies any of chest pain   - Dec 31 2023 ER visit with chest pain, pounding like pain.  - trops neg x 2, EKG SR chronic LBBB - no recurrence     3. Mild aortic stenosis - Jan 2020 echo mean grad 11, AVA VTI 1.69 - 2021 mild to mod AS   03/2021 echo mild moderate AS:  mean grad 10.5, DI 0.31, AVA VTI 1.09   01/2023 echo [facility]: reports no AS, however I cannot see any AV measurements included in the report.    4. Chronic LBBB     5. . Metastatic breast cancer/Chronic pain - followed at [facility]. Metastatic breast cancer to the bone and renal lesion, has pulmonary nodules. On radiation. - has been seen by palliative care previously - from 03/08/20 onc note increased size of renal lesion, to f/u with urology. Mention concern for additional primary such as renal cell CA.    - recent leg weakness, progressing back pain. Had PET scan and MRI at [facility] within the last few weeks, she is waiting to hear back from neurosurgery   - from 08/2024 discharge plans were for home hospice. 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    6. Afib - off coumadin  for intrathecal pain pump through palliative pain management at [facility]   -with recent PE has been started on eliquis   - some palpitations 2months ago -    7. Recent lap chole 03/2021 - later required incarcerated umbilical hernia repair 03/2021     8. HTN - compliant with meds - home bps 100-120s/50s-60s     9. Chronic back pain - preop eval 01/2023 for back surgery   10.Pulmonary embolism - 03/2023 CT PE at [facility]: +PE -on eliquis , we  have deferred duration to heme/onc. Immobile with metastatic cancer, would think likely indefinite anticoag  Past Medical History:  Diagnosis Date   CAD in native artery    a. DES to ramus 2005 with late stent thrombosis 2006 tx with PTCA. 12/18 PCI/DES x1 to mRCA, EF 50-55%   CHF (congestive heart failure) (HCC)    Chronic pain    COPD (chronic obstructive pulmonary disease) (HCC)    Hyperlipidemia    Hypertension    Left bundle Kriya Westra block    Lymphedema    Metastatic breast cancer    a. to bone.   MI (myocardial infarction) (HCC)    Mild aortic stenosis 10/2017   Morbid obesity (HCC)    PAF (paroxysmal atrial fibrillation) (HCC)    PSVT (paroxysmal  supraventricular tachycardia)    a. per [facility] notes, seen on event monitor in 2014.   Pulmonary nodules    Past Surgical History:  Procedure Laterality Date   BILIARY STENT PLACEMENT  03/09/2024   Procedure: INSERTION, STENT, BILE DUCT;  Surgeon: Jinny Carmine, MD;  Location: [facility] ENDOSCOPY;  Service: Endoscopy;;   CHOLECYSTECTOMY N/A 03/29/2021   Procedure: LAPAROSCOPIC CHOLECYSTECTOMY;  Surgeon: Kallie Manuelita BROCKS, MD;  Location: [facility] ORS;  Service: General;  Laterality: N/A;   CORONARY STENT INTERVENTION N/A 11/26/2017   Procedure: CORONARY STENT INTERVENTION;  Surgeon: Jordan, Peter M, MD;  Location: [facility] INVASIVE CV LAB;  Service: Cardiovascular;  Laterality: N/A;   CORONARY STENT PLACEMENT     ERCP N/A 03/28/2021   Procedure: ENDOSCOPIC RETROGRADE CHOLANGIOPANCREATOGRAPHY (ERCP);  Surgeon: Golda Claudis PENNER, MD;  Location: [facility] ORS;  Service: Endoscopy;  Laterality: N/A;   ERCP N/A 03/09/2024   Procedure: ERCP, WITH INTERVENTION IF INDICATED;  Surgeon: Jinny Carmine, MD;  Location: [facility] ENDOSCOPY;  Service: Endoscopy;  Laterality: N/A;   ERCP N/A 04/15/2024   Procedure: ERCP, WITH INTERVENTION IF INDICATED;  Surgeon: Jinny Carmine, MD;  Location: [facility] ENDOSCOPY;  Service: Endoscopy;  Laterality: N/A;   ESOPHAGOGASTRODUODENOSCOPY  03/09/2024   Procedure: EGD (ESOPHAGOGASTRODUODENOSCOPY);  Surgeon: Jinny Carmine, MD;  Location[facility] ENDOSCOPY;  Service: Endoscopy;;   ESOPHAGOGASTRODUODENOSCOPY N/A 06/09/2024   Procedure: EGD (ESOPHAGOGASTRODUODENOSCOPY);  Surgeon: Cinderella Deatrice FALCON, MD;  Location: [facility] ENDO [facility];  Service: Endoscopy;  Laterality: N/A;   ESOPHAGOGASTRODUODENOSCOPY N/A 06/30/2024   Procedure: EGD (ESOPHAGOGASTRODUODENOSCOPY);  Surgeon: Wilhelmenia Aloha Raddle., MD;  Location: [facility];  Service: Gastroenterology;  Laterality: N/A;   ESOPHAGOGASTRODUODENOSCOPY N/A 07/14/2024   Procedure: EGD (ESOPHAGOGASTRODUODENOSCOPY);  Surgeon: Wilhelmenia Aloha Raddle., MD;  Location: [facility];   Service: Gastroenterology;  Laterality: N/A;   EUS N/A 06/30/2024   Procedure: ULTRASOUND, UPPER GI TRACT, ENDOSCOPIC;  Surgeon: Wilhelmenia Aloha Raddle., MD;  Location: [facility] ENDOSCOPY;  Service: Gastroenterology;  Laterality: N/A;   EUS N/A 07/14/2024   Procedure: ULTRASOUND, UPPER GI TRACT, ENDOSCOPIC;  Surgeon: Wilhelmenia Aloha Raddle., MD;  Location: [facility] ENDOSCOPY;  Service: Gastroenterology;  Laterality: N/A;   FINE NEEDLE ASPIRATION BIOPSY  07/14/2024   Procedure: FINE NEEDLE ASPIRATION BIOPSY;  Surgeon: Wilhelmenia Aloha Raddle., MD;  Location: [facility] ENDOSCOPY;  Service: Gastroenterology;;   intrathecal pain pump     LEFT HEART CATH AND CORONARY ANGIOGRAPHY N/A 11/26/2017   Procedure: LEFT HEART CATH AND CORONARY ANGIOGRAPHY;  Surgeon: Jordan, Peter M, MD;  Location: [facility] INVASIVE CV LAB;  Service: Cardiovascular;  Laterality: N/A;   REMOVAL OF STONES  03/28/2021   Procedure: REMOVAL OF STONES;  Surgeon: Golda Claudis PENNER, MD;  Location: [facility]S;  Service: Endoscopy;;   SPHINCTEROTOMY  03/28/2021  Procedure: SPHINCTEROTOMY;  Surgeon: Golda Claudis PENNER, MD;  Location: [facility] ORS;  Service: Endoscopy;;   UMBILICAL HERNIA REPAIR N/A 04/03/2021   Procedure: ADULT PRIMARY UMBILICAL HERNIA REPAIR;  Surgeon: Kallie Manuelita BROCKS, MD;  Location: [facility] ORS;  Service: General;  Laterality: N/A;   VASCULAR SURGERY       Active Medications[1]   Allergies:   Albuterol , Brilinta  [ticagrelor ], and Budesonide    Social History[2]   Family Hx: The patient's family history includes CAD in her paternal grandmother; CAD (age of onset: 8) in her father; COPD in her sister; Heart attack in her father. There is no history of Sudden Cardiac Death, Colon polyps, or Colon cancer.  ROS:   Please see the history of present illness.    *** All other systems reviewed and are negative.   Prior CV studies:   The following studies were reviewed today:  ***  Labs/Other Tests and Data Reviewed:    EKG:  {EKG/Telemetry Strips  [phone]}  Recent Labs: 04/14/2024: B Natriuretic Peptide 406.8 04/21/2024: TSH 4.720 06/09/2024: Magnesium  1.8 08/11/2024: BUN 12; Creatinine, Ser 0.50; Hemoglobin 10.8; Platelets 240; Potassium 4.3; Sodium 144 08/14/2024: ALT 20   Recent Lipid Panel No results found for: CHOL, TRIG, HDL, CHOLHDL, LDLCALC, LDLDIRECT  Wt Readings from Last 3 Encounters:  08/12/24 164 lb 0.4 oz (74.4 kg)  07/14/24 190 lb 14.7 oz (86.6 kg)  06/30/24 191 lb (86.6 kg)     Risk Assessment/Calculations:   {Does this patient have ATRIAL FIBRILLATION?:[phone]}      Objective:    Vital Signs:  There were no vitals taken for this visit.   {[facility] Virtual Exam (Optional):[id number]::VITAL SIGNS:  reviewed}  ASSESSMENT & PLAN:    1. HFimpEF - LVEF has normalized -no symptoms, continue current meds   2. CAD - had been on extended DAPT due to history of late stent thrombosis - now that she is on eliquis  would d/c ASA, continue plavix  and eliquis .  - recent atypical chest pains with negative ER workup, monitor at this time. In general poor candidate for ischemic testing given metastatic cancer, bed bound severe lower back pain with pain pump   3. PAF -off coumadin  since intrathecal pain pump placement at [facility] after discussions with pain clinic - she is back on anticoag however after PE, currently on eliquis  -defer anticoag to her [facility] team, continue as long as ok from intrathecal pump standpoint.    4. HTN -at goal, continue current meds   5. Aortic stenosis - recent echo at [facility] reports no stenosis but no parameters are included in the study - from prior echos mild to mod AS, low than expected gradient give low SVI, paradoxical low flow stenosi - no indication for repeat study at this time. With malignant cancer and immobility likely would not plan on ongoing surveillance at this time but can discuss over time.         {Are you ordering a CV Procedure (e.g. stress  test, cath, DCCV, TEE, etc)?   Press F2        :[id number]}     Time:   Today, I have spent *** minutes with the patient with telehealth technology discussing the above problems.     Medication Adjustments/Labs and Tests Ordered: Current medicines are reviewed at length with the patient today.  Concerns regarding medicines are outlined above.   Tests Ordered: No orders of the defined types were placed in this encounter.   Medication Changes: No orders of the defined types  were placed in this encounter.   Follow Up:  {F/U Format:[phone]} {follow up:15908}  Signed, Alvan Carrier, MD  11/21/2024 12:57 PM    [facility]     [1]  No outpatient medications have been marked as taking for the 11/21/24 encounter (Appointment) with Alvan Carrier FALCON, MD.  [2]  Social History Tobacco Use   Smoking status: Former    Current packs/day: 0.00    Average packs/day: 2.0 packs/day for 18.0 years (36.0 ttl pk-yrs)    Types: Cigarettes    Start date: 12/01/1962    Quit date: 12/01/1980    Years since quitting: 44.0   Smokeless tobacco: Never  Vaping Use   Vaping status: Never Used  Substance Use Topics   Alcohol  use: No   Drug use: Never

## 2024-12-20 ENCOUNTER — Encounter (HOSPITAL_COMMUNITY): Payer: Self-pay | Admitting: Oncology
